# Patient Record
Sex: Male | Born: 1960
Health system: Southern US, Community
[De-identification: ages and names within clinical notes are randomized; demographics above are authoritative.]

## PROBLEM LIST (undated history)

## (undated) ENCOUNTER — Emergency Department (HOSPITAL_BASED_OUTPATIENT_CLINIC_OR_DEPARTMENT_OTHER): Admission: EM | Payer: BC Managed Care – PPO | Source: Home / Self Care

## (undated) DIAGNOSIS — E079 Disorder of thyroid, unspecified: Secondary | ICD-10-CM

## (undated) DIAGNOSIS — R0602 Shortness of breath: Secondary | ICD-10-CM

## (undated) DIAGNOSIS — I471 Supraventricular tachycardia: Secondary | ICD-10-CM

## (undated) DIAGNOSIS — D123 Benign neoplasm of transverse colon: Secondary | ICD-10-CM

## (undated) DIAGNOSIS — K635 Polyp of colon: Secondary | ICD-10-CM

## (undated) DIAGNOSIS — L309 Dermatitis, unspecified: Secondary | ICD-10-CM

## (undated) DIAGNOSIS — E119 Type 2 diabetes mellitus without complications: Secondary | ICD-10-CM

## (undated) DIAGNOSIS — R0609 Other forms of dyspnea: Secondary | ICD-10-CM

## (undated) DIAGNOSIS — E785 Hyperlipidemia, unspecified: Secondary | ICD-10-CM

## (undated) DIAGNOSIS — N186 End stage renal disease: Secondary | ICD-10-CM

## (undated) DIAGNOSIS — N189 Chronic kidney disease, unspecified: Secondary | ICD-10-CM

## (undated) DIAGNOSIS — I350 Nonrheumatic aortic (valve) stenosis: Secondary | ICD-10-CM

## (undated) DIAGNOSIS — K5730 Diverticulosis of large intestine without perforation or abscess without bleeding: Secondary | ICD-10-CM

## (undated) DIAGNOSIS — G629 Polyneuropathy, unspecified: Secondary | ICD-10-CM

## (undated) DIAGNOSIS — M1990 Unspecified osteoarthritis, unspecified site: Secondary | ICD-10-CM

## (undated) DIAGNOSIS — K5900 Constipation, unspecified: Secondary | ICD-10-CM

## (undated) DIAGNOSIS — I6310 Cerebral infarction due to embolism of unspecified precerebral artery: Secondary | ICD-10-CM

## (undated) DIAGNOSIS — R51 Headache: Secondary | ICD-10-CM

## (undated) DIAGNOSIS — H3530 Unspecified macular degeneration: Secondary | ICD-10-CM

## (undated) DIAGNOSIS — G4730 Sleep apnea, unspecified: Secondary | ICD-10-CM

## (undated) DIAGNOSIS — Z5189 Encounter for other specified aftercare: Secondary | ICD-10-CM

## (undated) DIAGNOSIS — I499 Cardiac arrhythmia, unspecified: Secondary | ICD-10-CM

## (undated) DIAGNOSIS — D649 Anemia, unspecified: Secondary | ICD-10-CM

## (undated) DIAGNOSIS — E039 Hypothyroidism, unspecified: Secondary | ICD-10-CM

## (undated) DIAGNOSIS — C801 Malignant (primary) neoplasm, unspecified: Secondary | ICD-10-CM

## (undated) DIAGNOSIS — K56609 Unspecified intestinal obstruction, unspecified as to partial versus complete obstruction: Secondary | ICD-10-CM

## (undated) DIAGNOSIS — T884XXA Failed or difficult intubation, initial encounter: Secondary | ICD-10-CM

## (undated) DIAGNOSIS — K429 Umbilical hernia without obstruction or gangrene: Secondary | ICD-10-CM

## (undated) DIAGNOSIS — I2510 Atherosclerotic heart disease of native coronary artery without angina pectoris: Secondary | ICD-10-CM

## (undated) DIAGNOSIS — R011 Cardiac murmur, unspecified: Secondary | ICD-10-CM

## (undated) DIAGNOSIS — I10 Essential (primary) hypertension: Secondary | ICD-10-CM

## (undated) HISTORY — DX: Cerebral infarction due to embolism of unspecified precerebral artery: I63.10

## (undated) HISTORY — DX: Chronic kidney disease, unspecified: N18.9

## (undated) HISTORY — DX: Diverticulosis of large intestine without perforation or abscess without bleeding: K57.30

## (undated) HISTORY — PX: SKIN CANCER EXCISION: SHX779

## (undated) HISTORY — DX: Supraventricular tachycardia: I47.1

## (undated) HISTORY — DX: Benign neoplasm of transverse colon: D12.3

## (undated) HISTORY — DX: Type 2 diabetes mellitus without complications: E11.9

## (undated) HISTORY — PX: COLONOSCOPY: SHX174

## (undated) HISTORY — DX: Disorder of thyroid, unspecified: E07.9

## (undated) HISTORY — DX: Essential (primary) hypertension: I10

## (undated) HISTORY — DX: Encounter for other specified aftercare: Z51.89

## (undated) HISTORY — DX: Polyp of colon: K63.5

## (undated) HISTORY — PX: KIDNEY TRANSPLANT: SHX239

## (undated) HISTORY — DX: Other forms of dyspnea: R06.09

## (undated) HISTORY — DX: Sleep apnea, unspecified: G47.30

## (undated) HISTORY — DX: Hyperlipidemia, unspecified: E78.5

## (undated) HISTORY — PX: OTHER SURGICAL HISTORY: SHX169

---
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
## 2006-06-13 ENCOUNTER — Inpatient Hospital Stay (HOSPITAL_COMMUNITY): Admission: EM | Admit: 2006-06-13 | Discharge: 2006-06-14 | Payer: Self-pay | Admitting: Emergency Medicine

## 2009-01-21 ENCOUNTER — Emergency Department (HOSPITAL_BASED_OUTPATIENT_CLINIC_OR_DEPARTMENT_OTHER): Admission: EM | Admit: 2009-01-21 | Discharge: 2009-01-21 | Payer: Self-pay | Admitting: Emergency Medicine

## 2009-01-21 ENCOUNTER — Ambulatory Visit: Payer: Self-pay | Admitting: Diagnostic Radiology

## 2009-05-11 ENCOUNTER — Ambulatory Visit (HOSPITAL_COMMUNITY): Admission: RE | Admit: 2009-05-11 | Discharge: 2009-05-11 | Payer: Self-pay | Admitting: Orthopedic Surgery

## 2010-10-08 HISTORY — PX: RENAL BIOPSY: SHX156

## 2011-01-13 LAB — CREATININE, SERUM
Creatinine, Ser: 1.5 mg/dL (ref 0.4–1.5)
GFR calc Af Amer: >60 mL/min (ref >60)
GFR calc non Af Amer: 50 mL/min — ABNORMAL LOW (ref >60)

## 2011-02-23 NOTE — Discharge Summary (Signed)
NAME:  Robert [name], Robert [name]              ACCOUNT NO.:  [id number]   MEDICAL RECORD NO.:  [id number]          PATIENT TYPE:  INP   LOCATION:  97                         FACILITY:  [facility]   PHYSICIAN:  Richard A. Rollene Fare, M.D.DATE OF BIRTH:  11/20/1960   DATE OF ADMISSION:  06/13/2006  DATE OF DISCHARGE:  06/14/2006                                 DISCHARGE SUMMARY   DISCHARGE DIAGNOSES:  1. Paroxysmal supraventricular tachycardia.  2. Hypertension.  3. Chronic renal insufficiency with a history of remote nephrectomy.  4. History of dyslipidemia.  5. History of gout.  6. Treated hypothyroidism.   HISTORY OF PRESENT ILLNESS:  Mr. [name] is a 50 year old male with no prior  history of coronary disease who has been followed by Dr. Juventino Slovak.  He was at a  meeting at work when he developed tachycardia which was associated with  diaphoresis and blurred vision.  When he went to the nurse at work, he was  put on a monitor and this apparently showed SVT, but soon after he converted  spontaneously to sinus rhythm in the 90s.  He was transferred by EMS and  admitted.   PAST MEDICAL HISTORY:  1. Hypertension.  2. Dyslipidemia.  3. Gout.  4. Treated hypothyroidism.  5. Trauma as a child resulting in a left nephrectomy, splenectomy,      appendectomy and partial bowel resection.   LABORATORY DATA AND X-RAY FINDINGS:  His potassium was 3.6.   HOSPITAL COURSE:  The patient was admitted by Dr. Alphonsa Overall.  He discussed  with the family diagnosis of AVNRT and treatment options including RF  ablation versus medical therapy.  We ended up discharging him on June 14, 2006.   DISCHARGE MEDICATIONS:  1. Metoprolol 50 mg b.i.d.  2. Verapamil 180 mg nightly.  3. Lipitor 40 mg a day.  4. Synthroid 0.1 mcg a day.  5. Aspirin daily.  6. He had previously been on Benicar/hydrochlorothiazide and we asked him      to stop this.   DISCHARGE LABORATORY DATA AND X-RAY FINDINGS:  EKG shows sinus rhythm  without acute changes.  Chest x-ray shows no acute disease, borderline heart  size.   White count 8.3, hemoglobin 18.1, hematocrit 52.2, platelets 294.  Sodium  138, potassium 3.6, BUN 14, creatinine 1.7.  LFTs were elevated slightly at  AST of 39, ALT 52.  CK was 296 with 6 MBs and relative index of 2 and a  negative troponin.  TSH was 2.08.   DISPOSITION:  The patient is discharged in stable condition.  He will follow  up with Dr. Rollene Fare as an outpatient.      Erlene Quan, P.A.      Richard A. Rollene Fare, M.D.  Electronically Signed    LKK/MEDQ  D:  08/09/2006  T:  08/10/2006  Job:  [id number]   cc:   Nelda Severe. Juventino Slovak, M.D.

## 2011-06-18 DIAGNOSIS — G4730 Sleep apnea, unspecified: Secondary | ICD-10-CM

## 2011-06-18 HISTORY — DX: Sleep apnea, unspecified: G47.30

## 2013-04-29 ENCOUNTER — Other Ambulatory Visit: Payer: Self-pay | Admitting: *Deleted

## 2013-04-29 MED ORDER — ROSUVASTATIN CALCIUM 20 MG PO TABS: 20.0000 mg | ORAL_TABLET | Freq: Every day | ORAL | Status: DC

## 2013-05-04 ENCOUNTER — Other Ambulatory Visit: Payer: Self-pay | Admitting: *Deleted

## 2013-05-04 MED ORDER — LEVOTHYROXINE SODIUM 100 MCG PO TABS: 100.0000 ug | ORAL_TABLET | Freq: Every day | ORAL | Status: DC

## 2013-07-01 ENCOUNTER — Other Ambulatory Visit: Payer: Self-pay | Admitting: Endocrinology

## 2013-08-10 ENCOUNTER — Telehealth: Payer: Self-pay | Admitting: *Deleted

## 2013-08-10 NOTE — Telephone Encounter (Signed)
Returned call and pt verified x 2.  Pt informed message received and refill cannot be given for 90-day supply as it has been > 1 year since last OV which was Jan. 2013.  Pt informed 30-day supply can be given and he will need appt w/ new cardiologist as Dr. Rollene Fare has retired.  Pt verbalized understanding and stated he will wait for appt w/ MD b/c "it will count against me" to get 30-day supply.  RN offered appt w/ [facility] for refills and then he can come back to see cardiologist.  Pt declined.  Stated he will wait for next appt w/ Dr. Claiborne Billings.  Appt scheduled for 11.11.14 at 11:30am w/ Dr. Claiborne Billings.

## 2013-08-10 NOTE — Telephone Encounter (Signed)
Waiting on paper chart.  Appears last OV was in Jan. 2013.  Pt will need to be seen for refills if this is correct.

## 2013-08-10 NOTE — Telephone Encounter (Signed)
Pt needs his Verapamil 240 mg 90 day supply called in to [facility] on [address]. Pt stated that [facility] did call [facility] and they stated that they did not have this one in their system to call us.

## 2013-08-18 ENCOUNTER — Encounter: Payer: Self-pay | Admitting: Cardiovascular Disease

## 2013-08-18 ENCOUNTER — Ambulatory Visit (INDEPENDENT_AMBULATORY_CARE_PROVIDER_SITE_OTHER): Payer: BC Managed Care – PPO | Admitting: Cardiovascular Disease

## 2013-08-18 VITALS — BP 142/90 | Ht 71.5 in | Wt 332.4 lb

## 2013-08-18 DIAGNOSIS — I471 Supraventricular tachycardia, unspecified: Secondary | ICD-10-CM

## 2013-08-18 DIAGNOSIS — Z905 Acquired absence of kidney: Secondary | ICD-10-CM

## 2013-08-18 DIAGNOSIS — I498 Other specified cardiac arrhythmias: Secondary | ICD-10-CM

## 2013-08-18 DIAGNOSIS — E039 Hypothyroidism, unspecified: Secondary | ICD-10-CM

## 2013-08-18 DIAGNOSIS — R609 Edema, unspecified: Secondary | ICD-10-CM

## 2013-08-18 DIAGNOSIS — R600 Localized edema: Secondary | ICD-10-CM

## 2013-08-18 DIAGNOSIS — I10 Essential (primary) hypertension: Secondary | ICD-10-CM

## 2013-08-18 DIAGNOSIS — G4733 Obstructive sleep apnea (adult) (pediatric): Secondary | ICD-10-CM

## 2013-08-18 DIAGNOSIS — N289 Disorder of kidney and ureter, unspecified: Secondary | ICD-10-CM

## 2013-08-18 DIAGNOSIS — E119 Type 2 diabetes mellitus without complications: Secondary | ICD-10-CM

## 2013-08-18 DIAGNOSIS — E785 Hyperlipidemia, unspecified: Secondary | ICD-10-CM

## 2013-08-18 MED ORDER — VERAPAMIL HCL ER 240 MG PO TBCR: 240.0000 mg | EXTENDED_RELEASE_TABLET | Freq: Every day | ORAL | Status: DC

## 2013-08-18 NOTE — Patient Instructions (Addendum)
Your physician recommends that you schedule a follow-up appointment in: 2 months  Your physician has recommended you make the following change in your medication: Increase hydralazine from 1 tablet to 1 1/2 tablet twice daily And increase Bystolic from 1 tablet to 1 1/2 tablets daily.

## 2013-09-01 ENCOUNTER — Encounter: Payer: Self-pay | Admitting: Cardiovascular Disease

## 2013-09-06 ENCOUNTER — Encounter: Payer: Self-pay | Admitting: Cardiovascular Disease

## 2013-09-06 DIAGNOSIS — E785 Hyperlipidemia, unspecified: Secondary | ICD-10-CM

## 2013-09-06 DIAGNOSIS — G4733 Obstructive sleep apnea (adult) (pediatric): Secondary | ICD-10-CM | POA: Insufficient documentation

## 2013-09-06 DIAGNOSIS — I471 Supraventricular tachycardia, unspecified: Secondary | ICD-10-CM

## 2013-09-06 DIAGNOSIS — R600 Localized edema: Secondary | ICD-10-CM

## 2013-09-06 DIAGNOSIS — Z905 Acquired absence of kidney: Secondary | ICD-10-CM | POA: Insufficient documentation

## 2013-09-06 DIAGNOSIS — E1169 Type 2 diabetes mellitus with other specified complication: Secondary | ICD-10-CM | POA: Insufficient documentation

## 2013-09-06 DIAGNOSIS — E039 Hypothyroidism, unspecified: Secondary | ICD-10-CM | POA: Insufficient documentation

## 2013-09-06 DIAGNOSIS — N289 Disorder of kidney and ureter, unspecified: Secondary | ICD-10-CM | POA: Insufficient documentation

## 2013-09-06 HISTORY — DX: Localized edema: R60.0

## 2013-09-06 HISTORY — DX: Supraventricular tachycardia: I47.1

## 2013-09-06 HISTORY — DX: Acquired absence of kidney: Z90.5

## 2013-09-06 HISTORY — DX: Hyperlipidemia, unspecified: E78.5

## 2013-09-06 HISTORY — DX: Obstructive sleep apnea (adult) (pediatric): G47.33

## 2013-09-06 HISTORY — DX: Supraventricular tachycardia, unspecified: I47.10

## 2013-09-06 NOTE — Progress Notes (Signed)
Patient ID: Robert [name], male   DOB: Mar 02, 1961, 52 y.o.   MRN: [id number]     HPI: Robert [name], is a 52 y.o. male who is a former patient of Dr. Terance Ice who is retired. The patient presents to the office today to establish care with me.  [name]  has a complex medical history that includes a congenital nonfunctioning left kidney for which he underwent left nephrectomy at age 39. He has a history of obesity, diabetes mellitus, and has been demonstrated to have proteinuria of his remaining single kidney, being status post open renal biopsy in [name] in November 2010. This revealed focal segmental glomerular sclerosis. At that time he was seen by Dr. Venita Sheffield in the nephrology department at [facility]. He sees Dr. Moshe Cipro now in [place] for his kidney insufficiency and has been on ARB therapy in the past in an attempt to reduce proteinuria. He sees Dr. Dwyane Dee for his diabetes mellitus. He also has a history of hypothyroidism, hyperlipidemia, anxiety and depression. He has a history of prior supraventricular tachycardia requiring hospitalization in 2007 as was felt to be possibly AV nodal reentrant rhythm. In addition, the patient has a history of complex sleep apnea initially diagnosed in 2007 with an AHI at that time of 56.7 per hour and during REM sleep this increased to 65.7 per hour. At that time he dropped his oxygen 84% and had loud snoring. He had been on BiPAP therapy. He underwent a 5 year followup split-night study in 2012 which again confirmed severe sleep apnea with an AHI of 60.7 on the baseline portion of the study. He was unable to achieve REM sleep at that time. Oxygen saturation during non-REM sleep at 83%. He was titrated utilizing BiPAP up to a setting of 16/12 cm water pressure. He does admit to continued residual daytime sleepiness despite using his BiPAP therapy. Patient also notes leg swelling. He also has had  significant gain in weight and admits to a 50 pound weight gain since he last saw Dr. Rollene Fare almost 2 years ago. He has noticed significant blood pressure lability with blood pressures as high as 180/120. He had been recently taken off losartan as well as atenolol and has been treated with a systolic 10 mg as well as hydralazine 50 mg 2 times daily. His hyperlipidemia has been treated with Crestor 20 mg as well as setting up. He also has hypothyroidism on Synthroid replacement. He presents now to establish care with me. A review of his chart indicates that he did have an echo Doppler study in August 2012 which showed mild concentric LVH with normal systolic function and grade 1 diastolic dysfunction. He had trace mitral and tricuspid insufficiency as well as pulmonic insufficiency. A nuclear perfusion study in August 2012 showed normal perfusion.   Epworth Sleepiness Scale: Situation   Chance of Dozing/Sleeping (0 = never , 1 = slight chance , 2 = moderate chance , 3 = high chance )   sitting and reading 1   watching TV 3   sitting inactive in a public place 1   being a passenger in a motor vehicle for an hour or more 2   lying down in the afternoon 3   sitting and talking to someone 0   sitting quietly after lunch (no alcohol) 3   while stopped for a few minutes in traffic as the driver 0   Total Score  13    Past Medical  History  Diagnosis Date  . Diabetes mellitus without complication   . Chronic kidney disease   . Hypertension   . Thyroid disease   . Sleep apnea 06/18/11    split-night sleep study- [facility].  . Hyperlipidemia   . PAT (paroxysmal atrial tachycardia)   . PSVT (paroxysmal supraventricular tachycardia)     History reviewed. No pertinent past surgical history. except left nephrectomy in 1974.  No Known Allergies  Current Outpatient Prescriptions  Medication Sig Dispense Refill  . BYSTOLIC 10 MG tablet Take 15 mg by mouth daily.       .  citalopram (CELEXA) 20 MG tablet Take 1 tablet by mouth daily.      Marland Kitchen desoximetasone (TOPICORT) 0.25 % cream Apply 1 application topically daily.      . furosemide (LASIX) 80 MG tablet Take 80 mg by mouth 2 (two) times daily. Takes 120 mg      . hydrALAZINE (APRESOLINE) 50 MG tablet Take 75 mg by mouth 2 (two) times daily.       . insulin glargine (LANTUS) 100 units/mL SOLN Inject 26 Units into the skin at bedtime.      . insulin regular human CONCENTRATED (HUMULIN R) 500 UNIT/ML SOLN injection 8/5/8 units daily.      Marland Kitchen levothyroxine (SYNTHROID, LEVOTHROID) 100 MCG tablet Take 1 tablet (100 mcg total) by mouth daily.  90 tablet  1  . Liraglutide (VICTOZA) 18 MG/3ML SOPN Inject 1.2 mg into the skin daily.      . NON FORMULARY CPAP therapy.      . rosuvastatin (CRESTOR) 20 MG tablet Take 1 tablet (20 mg total) by mouth daily.  90 tablet  1  . sildenafil (VIAGRA) 100 MG tablet Take 100 mg by mouth as needed for erectile dysfunction.      . Testosterone (FORTESTA) 10 MG/ACT (2%) GEL Place onto the skin daily. 6 pumps daily      . verapamil (CALAN-SR) 240 MG CR tablet Take 1 tablet (240 mg total) by mouth daily.  90 tablet  3  . ZETIA 10 MG tablet Take 1 tablet by mouth daily.       No current facility-administered medications for this visit.   Social history is notable in that he is married for 27 years. His 3 children. 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 He does drink occasional alcohol. He has smoked cigars.  ROS negative for fever, chills or night sweats. He admits to a 50 pound weight gain over the past 2 years. He denies skin rash he denies vision changes. He denies changes in hearing. He denies cough increased sputum production or wheezing. There is a remote history of PSVT. He denies recent palpitations. He denies PND or orthopnea. He denies chest pain or anginal symptoms. He denies nausea vomiting or diarrhea. He is unaware of any blood in stool or urine.  He does have history of proteinuria. He admits to tense leg swelling he denies claudication. He does have diabetes. He does have hypothyroidism. He does have hyperlipidemia. He does have complex sleep apnea. He admits to residual daytime sleepiness. He is unaware of restless leg. He denies bruxism. He denies seizures. He denies tremors. He denies psychiatric issues but at times has had some anxiety. Other comprehensive 14 point system review is negative.  PE BP 142/90  Ht 5' 11.5" (1.816 m)  Wt 332 lb 6.4 oz (150.776 kg)  BMI 45.72 kg/m2  General: Alert, oriented, no distress.  Skin: normal turgor, no rashes HEENT: Normocephalic, atraumatic. Pupils round and reactive; sclera anicteric; Fundi mild arteriolar narrowing without hemorrhages or exudates the Nose without nasal septal hypertrophy Mouth/Parynx benign; Mallinpatti scale 3 Neck: No JVD, no carotid briuts Lungs: clear to ausculatation and percussion; no wheezing or rales Heart: RRR, s1 s2 normal soft S4. Faint 1/6 systolic murmur. Abdomen: soft, nontender; no hepatosplenomehaly, BS+; abdominal aorta nontender and not dilated by palpation. Pulses 2+ Extremities: 1-2+ tense lower extremity edema; no clubbinbg cyanosis, Homan's sign negative  Neurologic: grossly nonfocal Psychologic: Normal affect and mood appear  ECG: Normal sinus 34 beats per minute. PR interval 138 ms. QT interval 448 ms.    LABS:  BMET    Component Value Date/Time   CREATININE 1.50 05/11/2009 1800   GFRNONAA 50* 05/11/2009 1800   GFRAA  Value: >60        The eGFR has been calculated using the MDRD equation. This calculation has not been validated in all clinical situations. eGFR's persistently <60 mL/min signify possible Chronic Kidney Disease. 05/11/2009 1800     Hepatic Function Panel  No results found for this basename: prot, albumin, ast, alt, alkphos, bilitot, bilidir, ibili     CBC No results found for this basename: wbc, rbc, hgb, hct, plt, mcv, mch,  mchc, rdw, neutrabs, lymphsabs, monoabs, eosabs, basosabs     BNP No results found for this basename: probnp    Lipid Panel  No results found for this basename: chol, trig, hdl, cholhdl, vldl, ldlcalc     RADIOLOGY: No results found.    ASSESSMENT AND PLAN: Mr. Louks has a complex medical history as outlined above. He has a single  kidney with reduced or 2 focal segmental glomerulosclerosis. He does have long-standing history of diabetes mellitus, hypertension, obesity, hypothyroidism, hyperlipidemia, and complex sleep apnea. Presently, I am recommending further titration of his medications from tube for improved blood pressure control. I am increasing his Bystolic from 10 mg to 15 mg daily. I am also increasing his hydralazine from 50 mg twice a day to 75 mg twice a day. We discussed his weight gain and importance of weight loss and activity. He has been documented to have normal systolic function with diastolic relaxation abnormality. Microinfusion study has shown normal perfusion. He does have complex sleep apnea. He cannot recall his last download. Review of his record indicates he was titrated on BiPAP to 16/12. We will contact his M.D[employer] company to arrange for a download of his current unit to make certain he is on adequate therapy. He still has significant residual daytime sleepiness is endorsed by his Epworth Sleepiness Scale calculated above. He tells me he will have an laboratory be done at [facility] and I will obtain these results when available. Cardiac rhythm is stable but he should do well with the increased beta blocker regimen. His diabetes is being followed by Dr. Dwyane Dee. I will see him in 2 months for cardiology reevaluation or sooner if problems arise.     Troy Sine, MD, [facility]  09/06/2013 8:02 PM

## 2013-09-07 ENCOUNTER — Other Ambulatory Visit: Payer: Self-pay | Admitting: Endocrinology

## 2013-10-06 ENCOUNTER — Other Ambulatory Visit: Payer: Self-pay | Admitting: Endocrinology

## 2013-12-28 ENCOUNTER — Other Ambulatory Visit: Payer: Self-pay | Admitting: Endocrinology

## 2014-01-04 ENCOUNTER — Ambulatory Visit (INDEPENDENT_AMBULATORY_CARE_PROVIDER_SITE_OTHER): Payer: BC Managed Care – PPO | Admitting: Endocrinology

## 2014-01-04 ENCOUNTER — Telehealth: Payer: Self-pay | Admitting: *Deleted

## 2014-01-04 ENCOUNTER — Other Ambulatory Visit: Payer: Self-pay | Admitting: *Deleted

## 2014-01-04 ENCOUNTER — Ambulatory Visit (INDEPENDENT_AMBULATORY_CARE_PROVIDER_SITE_OTHER): Payer: BC Managed Care – PPO | Admitting: Cardiology

## 2014-01-04 ENCOUNTER — Encounter: Payer: Self-pay | Admitting: Endocrinology

## 2014-01-04 VITALS — BP 144/92 | HR 72 | Ht 72.0 in | Wt 328.0 lb

## 2014-01-04 VITALS — BP 132/90 | HR 81 | Temp 98.1°F | Resp 16 | Ht 72.0 in | Wt 327.0 lb

## 2014-01-04 DIAGNOSIS — E1129 Type 2 diabetes mellitus with other diabetic kidney complication: Secondary | ICD-10-CM

## 2014-01-04 DIAGNOSIS — E785 Hyperlipidemia, unspecified: Secondary | ICD-10-CM

## 2014-01-04 DIAGNOSIS — R0609 Other forms of dyspnea: Secondary | ICD-10-CM

## 2014-01-04 DIAGNOSIS — R5383 Other fatigue: Secondary | ICD-10-CM

## 2014-01-04 DIAGNOSIS — R0989 Other specified symptoms and signs involving the circulatory and respiratory systems: Secondary | ICD-10-CM

## 2014-01-04 DIAGNOSIS — R5381 Other malaise: Secondary | ICD-10-CM

## 2014-01-04 DIAGNOSIS — E119 Type 2 diabetes mellitus without complications: Secondary | ICD-10-CM

## 2014-01-04 DIAGNOSIS — E039 Hypothyroidism, unspecified: Secondary | ICD-10-CM

## 2014-01-04 DIAGNOSIS — E1165 Type 2 diabetes mellitus with hyperglycemia: Principal | ICD-10-CM

## 2014-01-04 DIAGNOSIS — G4733 Obstructive sleep apnea (adult) (pediatric): Secondary | ICD-10-CM

## 2014-01-04 DIAGNOSIS — E291 Testicular hypofunction: Secondary | ICD-10-CM

## 2014-01-04 DIAGNOSIS — R0600 Dyspnea, unspecified: Secondary | ICD-10-CM

## 2014-01-04 LAB — COMPREHENSIVE METABOLIC PANEL
ALBUMIN: 3.4 g/dL — AB (ref 3.5–5.2)
ALT: 15 U/L (ref 0–53)
AST: 14 U/L (ref 0–37)
Alkaline Phosphatase: 49 U/L (ref 39–117)
BUN: 68 mg/dL — AB (ref 6–23)
CALCIUM: 8.3 mg/dL — AB (ref 8.4–10.5)
CHLORIDE: 106 meq/L (ref 96–112)
CO2: 19 mEq/L (ref 19–32)
Creatinine, Ser: 7.3 mg/dL (ref 0.4–1.5)
GFR: 8.29 mL/min — CL (ref >60.00)
GLUCOSE: 132 mg/dL — AB (ref 70–99)
POTASSIUM: 4.2 meq/L (ref 3.5–5.1)
Sodium: 137 mEq/L (ref 135–145)
TOTAL PROTEIN: 6.6 g/dL (ref 6.0–8.3)
Total Bilirubin: 0.5 mg/dL (ref 0.3–1.2)

## 2014-01-04 LAB — URINALYSIS, ROUTINE W REFLEX MICROSCOPIC
Bilirubin Urine: NEGATIVE
Ketones, ur: NEGATIVE
LEUKOCYTES UA: NEGATIVE
Nitrite: NEGATIVE
RBC / HPF: NONE SEEN (ref 0–?)
SPECIFIC GRAVITY, URINE: 1.02 (ref 1.000–1.030)
Total Protein, Urine: 100 — AB
URINE GLUCOSE: 100 — AB
Urobilinogen, UA: 0.2 (ref 0.0–1.0)
pH: 6 (ref 5.0–8.0)

## 2014-01-04 LAB — LIPID PANEL
CHOL/HDL RATIO: 4
Cholesterol: 174 mg/dL (ref 0–200)
HDL: 38.8 mg/dL — AB (ref >39.00)
LDL CALC: 119 mg/dL — AB (ref 0–99)
Triglycerides: 83 mg/dL (ref 0.0–149.0)
VLDL: 16.6 mg/dL (ref 0.0–40.0)

## 2014-01-04 LAB — TSH: TSH: 2.75 u[IU]/mL (ref 0.35–5.50)

## 2014-01-04 LAB — MICROALBUMIN / CREATININE URINE RATIO
CREATININE, U: 44.6 mg/dL
Microalb Creat Ratio: 503.9 mg/g — ABNORMAL HIGH (ref 0.0–30.0)

## 2014-01-04 LAB — T4, FREE: Free T4: 0.67 ng/dL (ref 0.60–1.60)

## 2014-01-04 LAB — HEMOGLOBIN A1C: Hgb A1c MFr Bld: 9.4 % — ABNORMAL HIGH (ref 4.6–6.5)

## 2014-01-04 MED ORDER — LEVOTHYROXINE SODIUM 100 MCG PO TABS: 100.0000 ug | ORAL_TABLET | Freq: Every day | ORAL | Status: DC

## 2014-01-04 MED ORDER — LIRAGLUTIDE 18 MG/3ML ~~LOC~~ SOPN: 1.2000 mg | PEN_INJECTOR | Freq: Every day | SUBCUTANEOUS | Status: DC

## 2014-01-04 NOTE — Telephone Encounter (Signed)
Tonya from the lab called, patient has critical labs:  Creatinine is 7.3 and GFR is 8.3

## 2014-01-04 NOTE — Progress Notes (Signed)
Patient ID: Robert [name], male   DOB: 1961-10-07, 53 y.o.   MRN: [id number]   Reason for Appointment: Type II Diabetes follow-up   History of Present Illness   Diagnosis date:  2012  Previous history: His blood sugar has been difficult to control since onset. Initially was taking oral hypoglycemic drugs like glyburide but could not take metformin because of renal dysfunction. Since Victoza did not help his control he was started on Lantus and then U 500 insulin also His A1c has previously ranged from 7.8-9.8, the lowest level in 03/2013  Recent history: He has continued his regimen of basal insulin and U-500 insulin as before has not been seen in followup since 6/14 Has changed his Lantus to the bedtime insulin morning for convenience but apparently he is feeling symptoms of hyperglycemia on waking up occasionally He continues to take [place] which although he had received it was causing constipation he is doing well with 1.2 mg now compared to the lower dose previously Current problems:  Not checking blood sugar consistently. Checking blood sugars mostly in the morning and sometimes midday tomorrow afternoon  Appears to have consistently high readings after dinner at night  Has difficulty watching his diet especially in the evenings or when eating out. He thinks most of his high readings at night not related to inconsistent diet. However he thinks that he has done somewhat better portion control  Not able to exercise much because of tendency to dyspnea on exertion  He is concerned about cost of various medications including Victoza     Oral hypoglycemic drugs:        Side effects from medications: None Insulin regimen: LANTUS 26 units hs now. U-500 insulin: 8-5-8. 30 min ac;        Proper timing of medications in relation to meals: Yes.          Monitors blood glucose: Once a day.    Glucometer: One Touch.          Blood Glucose readings from meter download:   PREMEAL Breakfast  Lunch Dinner Bedtime Overall  Glucose range:  75-145   148, 179   133-451   188-206    Mean/median:  104    257   192   147    POST-MEAL PC Breakfast PC Lunch PC Dinner  Glucose range:  101, 194    Mean/median:       Hypoglycemia:  none but feels a little weak and shaky if blood sugars are low  normal         Meals: 3 meals per day. Dinner 7 p,m          Physical activity: exercise: walking 30 min           Dietician visit: Most recent:   Weight control:  Wt Readings from Last 3 Encounters:  01/04/14 327 lb (148.326 kg)  08/18/13 332 lb 6.4 oz (150.776 kg)          Complications:   nephropathy, neuropathy   Diabetes labs:  No results found for this basename: HGBA1C   Lab Results  Component Value Date   CREATININE 1.50 05/11/2009       Medication List       This list is accurate as of: 01/04/14  9:29 AM.  Always use your most recent med list.               BYSTOLIC 10 MG tablet  Generic drug:  nebivolol  Take 15  mg by mouth daily.     citalopram 20 MG tablet  Commonly known as:  CELEXA  Take 1 tablet by mouth daily.     desoximetasone 0.25 % cream  Commonly known as:  TOPICORT  Apply 1 application topically daily.     FORTESTA 10 MG/ACT (2%) Gel  Generic drug:  Testosterone  Place onto the skin daily. 6 pumps daily     furosemide 80 MG tablet  Commonly known as:  LASIX  Take 80 mg by mouth 2 (two) times daily. Takes 120 mg     HUMULIN R 500 UNIT/ML Soln injection  Generic drug:  insulin regular human CONCENTRATED  8/5/8 units daily.     HUMULIN R 500 UNIT/ML Soln injection  Generic drug:  insulin regular human CONCENTRATED  takes 8-4-7     hydrALAZINE 50 MG tablet  Commonly known as:  APRESOLINE  Take 75 mg by mouth 2 (two) times daily.     insulin glargine 100 units/mL Soln  Commonly known as:  LANTUS  Inject 26 Units into the skin at bedtime.     levothyroxine 100 MCG tablet  Commonly known as:  SYNTHROID, LEVOTHROID  Take 1 tablet (100  mcg total) by mouth daily.     NON FORMULARY  CPAP therapy.     rosuvastatin 20 MG tablet  Commonly known as:  CRESTOR  Take 1 tablet (20 mg total) by mouth daily.     sildenafil 100 MG tablet  Commonly known as:  VIAGRA  Take 100 mg by mouth as needed for erectile dysfunction.     verapamil 240 MG CR tablet  Commonly known as:  CALAN-SR  Take 1 tablet (240 mg total) by mouth daily.     VICTOZA 18 MG/3ML Sopn  Generic drug:  Liraglutide  Inject 1.2 mg into the skin daily.     ZETIA 10 MG tablet  Generic drug:  ezetimibe  Take 1 tablet by mouth daily.        Allergies: No Known Allergies  Past Medical History  Diagnosis Date  . Diabetes mellitus without complication   . Chronic kidney disease   . Hypertension   . Thyroid disease   . Sleep apnea 06/18/11    split-night sleep study- [facility].  . Hyperlipidemia   . PAT (paroxysmal atrial tachycardia)   . PSVT (paroxysmal supraventricular tachycardia)     No past surgical history on file.  No family history on file.  Social History:  reports that he has been smoking Pipe.  He has never used smokeless tobacco. He reports that he drinks alcohol. His drug history is not on file.  Review of Systems:  Hypertension:  BP at home 130/80-90 in the mornings, later in the day 150/100-110. His blood pressure has been managed by his cardiologist mostly  Lipids: He has been unable to get consistent control. Was switched from Lipitor to Crestor because of relatively high LDL levels. Also subsequently Zetia was added to get LDL to target. However he is complaining about the cost of both medications now  No results found for this basename: CHOL, HDL, LDLCALC, LDLDIRECT, TRIG, CHOLHDL   He tends to get short of breath on exertion.  Thyroid: He has had long-standing hypothyroidism but has been out of his medication for couple of days TSH had been previously consistent. Does complain of feeling tired  recently    HYPOGONADISM: 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 metabolic syndrome and had subjectively  improved with supplementation using Fortesta He is off testosterone for the last 2 months as he did not have a refill but also complaining about the cost of medication     Examination:   BP 132/90  Pulse 81  Temp(Src) 98.1 F (36.7 C)  Resp 16  Ht 6' (1.829 m)  Wt 327 lb (148.326 kg)  BMI 44.34 kg/m2  SpO2 97%  Body mass index is 44.34 kg/(m^2).    ASSESSMENT/ PLAN::    Diabetes type 2:  Previously has had difficulty controlling his glucose adequately even with multiple insulin doses adding up to over 100 units a day Most of his difficulty appears to be still related to inconsistent diet Although he has lost a little weight he still has a relatively high postprandial readings Currently his fasting readings are fairly good and occasionally low normal with his switching Lantus to night time on his own  Recommendations made today:  Blood glucose control needs to be assessed with an A1c today  Check readings after meals especially supper more often  Control carbohydrates and fats better in meals  Continue same regimen of U-500, 30 minutes before each meal  Change Lantus to the morning to avoid overnight hypoglycemia and better daytime blood sugar control  Continue regular exercise as tolerated  Given co-pay cards today to help his cost of Victoza and Lantus  More regular followup  HYPERCHOLESTEROLEMIA: He needs high-dose statin drugs and would prefer to continue Crestor for better efficacy. Doses will be limited to 20 mg because of renal dysfunction Given co-pay card for this He ideally should be taking Zetia but he does not want to pay for the high cost of this  HYPOGONADISM: 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 metabolic syndrome and had subjectively improved with supplementation He is off testosterone and  will need to reassess his levels today Discussed trying to use generic formulation of Vogelxo as the usual brand names appear to be not on his formulary  HYPERTENSION: Appears inadequately controlled according to his home readings, he will discuss treatment with cardiologist and nephrologist because of multiple other problems and tendency toward edema and ? Diastolic dysfunction  Diabetic neuropathy: Has mild sensory loss   Maycen Degregory 01/04/2014, 9:29 AM   Labs:  A1c 9.4, LDL 119, testosterone level, thyroid normal  Office Visit on 01/04/2014  Component Date Value Ref Range Status  . Hemoglobin A1C 01/04/2014 9.4* 4.6 - 6.5 % Final   Glycemic Control Guidelines for People with Diabetes:Non Diabetic:  <6%Goal of Therapy: <7%Additional Action Suggested:  >8%   . Sodium 01/04/2014 137  135 - 145 mEq/L Final  . Potassium 01/04/2014 4.2  3.5 - 5.1 mEq/L Final  . Chloride 01/04/2014 106  96 - 112 mEq/L Final  . CO2 01/04/2014 19  19 - 32 mEq/L Final  . Glucose, Bld 01/04/2014 132* 70 - 99 mg/dL Final  . BUN 01/04/2014 68* 6 - 23 mg/dL Final  . Creatinine, Ser 01/04/2014 7.3* 0.4 - 1.5 mg/dL Final  . Total Bilirubin 01/04/2014 0.5  0.3 - 1.2 mg/dL Final  . Alkaline Phosphatase 01/04/2014 49  39 - 117 U/L Final  . AST 01/04/2014 14  0 - 37 U/L Final  . ALT 01/04/2014 15  0 - 53 U/L Final  . Total Protein 01/04/2014 6.6  6.0 - 8.3 g/dL Final  . Albumin 01/04/2014 3.4* 3.5 - 5.2 g/dL Final  . Calcium 01/04/2014 8.3* 8.4 - 10.5 mg/dL Final  . GFR 01/04/2014 8.29* >  60.00 mL/min Final  . Microalb, Ur 01/04/2014 224.5 Repeated and verified X2.* 0.0 - 1.9 mg/dL Final   Verified by manual dilution.  . Creatinine,U 01/04/2014 44.6   Final  . Microalb Creat Ratio 01/04/2014 503.9* 0.0 - 30.0 mg/g Final  . Cholesterol 01/04/2014 174  0 - 200 mg/dL Final   ATP III Classification       Desirable:  < 200 mg/dL               Borderline High:  200 - 239 mg/dL          High:  > = 240 mg/dL  .  Triglycerides 01/04/2014 83.0  0.0 - 149.0 mg/dL Final   Normal:  <150 mg/dLBorderline High:  150 - 199 mg/dL  . HDL 01/04/2014 38.80* >39.00 mg/dL Final  . VLDL 01/04/2014 16.6  0.0 - 40.0 mg/dL Final  . LDL Cholesterol 01/04/2014 119* 0 - 99 mg/dL Final  . Total CHOL/HDL Ratio 01/04/2014 4   Final                  Men          Women1/2 Average Risk     3.4          3.3Average Risk          5.0          4.42X Average Risk          9.6          7.13X Average Risk          15.0          11.0                      . TSH 01/04/2014 2.75  0.35 - 5.50 uIU/mL Final  . Testosterone 01/04/2014 304  300 - 890 ng/dL Final   Comment:           Tanner Stage       Male              Male                                        I              < 30 ng/dL        < 10 ng/dL                                        II             < 150 ng/dL       < 30 ng/dL                                        III            100-320 ng/dL     < 35 ng/dL                                        IV  200-970 ng/dL     15-40 ng/dL                                        V/Adult        300-890 ng/dL     10-70 ng/dL                             . Sex Hormone Binding 01/04/2014 24  13 - 71 nmol/L Final  . Testosterone, Free 01/04/2014 71.2  47.0 - 244.0 pg/mL Final   Comment:                            The concentration of free testosterone is derived from a mathematical                          expression based on constants for the binding of testosterone to sex                          hormone-binding globulin and albumin.  . Testosterone-% Free 01/04/2014 2.3  1.6 - 2.9 % Final  . Free T4 01/04/2014 0.67  0.60 - 1.60 ng/dL Final  . Color, Urine 01/04/2014 YELLOW  Yellow;Lt. Yellow Final  . APPearance 01/04/2014 CLEAR  Clear Final  . Specific Gravity, Urine 01/04/2014 1.020  1.000-1.030 Final  . pH 01/04/2014 6.0  5.0 - 8.0 Final  . Total Protein, Urine 01/04/2014 100* Negative Final  . Urine Glucose 01/04/2014 100*  Negative Final  . Ketones, ur 01/04/2014 NEGATIVE  Negative Final  . Bilirubin Urine 01/04/2014 NEGATIVE  Negative Final  . Hgb urine dipstick 01/04/2014 TRACE-LYSED* Negative Final  . Urobilinogen, UA 01/04/2014 0.2  0.0 - 1.0 Final  . Leukocytes, UA 01/04/2014 NEGATIVE  Negative Final  . Nitrite 01/04/2014 NEGATIVE  Negative Final  . WBC, UA 01/04/2014 0-2/hpf  0-2/hpf Final  . RBC / HPF 01/04/2014 none seen  0-2/hpf Final  . Squamous Epithelial / LPF 01/04/2014 Rare(0-4/hpf)  Rare(0-4/hpf) Final

## 2014-01-04 NOTE — Telephone Encounter (Signed)
Please let him know his kidney function appears much worse and fax the results to Dr. Moshe Cipro

## 2014-01-04 NOTE — Patient Instructions (Addendum)
Lantus 26 units in am also, may reduce to 24 if supper sugars < 90  Please check blood sugars at least half the time about 2 hours after any meal and as directed on waking up.  Please bring blood sugar monitor to each visit  Discuss BP Rx with Dr Claiborne Billings

## 2014-01-04 NOTE — Patient Instructions (Addendum)
Your physician recommends that you schedule a follow-up appointment in:    Also we will schedule A 2D-Echo

## 2014-01-04 NOTE — Telephone Encounter (Signed)
Results given to patient and faxed to Dr. Moshe Cipro.

## 2014-01-05 ENCOUNTER — Other Ambulatory Visit: Payer: Self-pay | Admitting: *Deleted

## 2014-01-05 LAB — TESTOSTERONE, FREE, TOTAL, SHBG
Sex Hormone Binding: 24 nmol/L (ref 13–71)
Testosterone, Free: 71.2 pg/mL (ref 47.0–244.0)
Testosterone-% Free: 2.3 % (ref 1.6–2.9)
Testosterone: 304 ng/dL (ref 300–890)

## 2014-01-05 MED ORDER — INSULIN REGULAR HUMAN (CONC) 500 UNIT/ML ~~LOC~~ SOLN: SUBCUTANEOUS | Status: DC

## 2014-01-05 MED ORDER — LEVOTHYROXINE SODIUM 100 MCG PO TABS: 100.0000 ug | ORAL_TABLET | Freq: Every day | ORAL | Status: DC

## 2014-01-05 MED ORDER — INSULIN PEN NEEDLE 32G X 4 MM MISC: Status: DC

## 2014-01-05 MED ORDER — INSULIN GLARGINE 100 UNIT/ML SOLOSTAR PEN: 26.0000 [IU] | PEN_INJECTOR | Freq: Every day | SUBCUTANEOUS | Status: DC

## 2014-01-05 MED ORDER — ROSUVASTATIN CALCIUM 20 MG PO TABS: 20.0000 mg | ORAL_TABLET | Freq: Every day | ORAL | Status: DC

## 2014-01-08 ENCOUNTER — Encounter: Payer: Self-pay | Admitting: Cardiology

## 2014-01-08 DIAGNOSIS — R5383 Other fatigue: Secondary | ICD-10-CM | POA: Insufficient documentation

## 2014-01-08 HISTORY — DX: Other fatigue: R53.83

## 2014-01-08 NOTE — Assessment & Plan Note (Addendum)
Fatigue may very well be due to the fact that he has been off of his synthroid for several months (recently restarted). However, given his increased fatigue with dyspnea and LEE, I will order a 2D echocardiogram to see if there has been any change in systolic function since prior study in 2012. Pt instructed to continue with is daily Lasix. His fatigue may also be related to noncompliance with CPAP therapy, in the setting of OSA. He is due back to see Dr. Claiborne Billings in sleep clinic for re-evaluation. Pt was encouraged to try to sleep with CPAP.

## 2014-01-08 NOTE — Assessment & Plan Note (Signed)
Recently restarted synthroid. Followed by PCP.

## 2014-01-08 NOTE — Progress Notes (Signed)
Patient ID: Robert [name], male   DOB: [birth year], 53 y.o.   MRN: [id number]    01/08/2014 ENRIQUE RAVELING   05/11/1961  [id number]  Primary Physicia Pauline Good, MD Primary Cardiologist: Dr. Claiborne Billings  HPI:  [name] is a 53 y/o male, previously followed by Dr. Rollene Fare and now followed by Dr. Claiborne Billings. He has a complex medical history that includes a congenital nonfunctioning left kidney for which he underwent left nephrectomy at age 1. He has a history of obesity, diabetes mellitus, and has been demonstrated to have proteinuria of his remaining single kidney, being status post open renal biopsy in [name] in November 2010. This revealed focal segmental glomerular sclerosis. At that time he was seen by Dr. Venita Sheffield in the nephrology department at [facility]. He sees Dr. Moshe Cipro now in [place] for his kidney insufficiency and has been on ARB therapy in the past in an attempt to reduce proteinuria. He sees Dr. Dwyane Dee for his diabetes mellitus. He also has a history of hypothyroidism, hyperlipidemia, anxiety and depression. He has a history of prior supraventricular tachycardia requiring hospitalization in 2007 as was felt to be possibly AV nodal reentrant rhythm.   In addition, the patient has a history of complex sleep apnea initially diagnosed in 2007 with an AHI at that time of 56.7 per hour and during REM sleep this increased to 65.7 per hour. At that time he dropped his oxygen 84% and had loud snoring. He had been on BiPAP therapy. He underwent a 5 year followup split-night study in 2012 which again confirmed severe sleep apnea with an AHI of 60.7 on the baseline portion of the study. He was unable to achieve REM sleep at that time. Oxygen saturation during non-REM sleep at 83%. He was titrated utilizing BiPAP up to a setting of 16/12 cm water pressure. He does admit to continued residual daytime sleepiness despite using his BiPAP  therapy.  Doppler study in August 2012 which showed mild concentric LVH with normal systolic function and grade 1 diastolic dysfunction. He had trace mitral and tricuspid insufficiency as well as pulmonic insufficiency. A nuclear perfusion study in August 2012 showed normal perfusion.   He presents to clinic today with a complaint of increased fatigue over the past several months. He continues to note DOE and mild SOB at rest, but contributes this to his weight. He has some SOB at night but admits to being non compliant with CPAP.  He also feels that some of his fatigue may be due to the fact that he has been off of his synthroid and verapamil since christmas. He recently started back taking both meds this week. He denies chest pain but notes bilateral LEE. Also denies syncope/ near syncope, dizziness and palpitations.    Current Outpatient Prescriptions  Medication Sig Dispense Refill  . BYSTOLIC 10 MG tablet Take 15 mg by mouth daily.       Marland Kitchen desoximetasone (TOPICORT) 0.25 % cream Apply 1 application topically daily.      . furosemide (LASIX) 80 MG tablet Take 80 mg by mouth 2 (two) times daily. Takes 120 mg      . hydrALAZINE (APRESOLINE) 50 MG tablet Take 75 mg by mouth 2 (two) times daily.       . Liraglutide (VICTOZA) 18 MG/3ML SOPN Inject 1.2 mg into the skin daily.  6 pen  1  . NON FORMULARY CPAP therapy.      . sildenafil (VIAGRA) 100 MG tablet  Take 100 mg by mouth as needed for erectile dysfunction.      . Insulin Glargine (LANTUS SOLOSTAR) 100 UNIT/ML Solostar Pen Inject 26 Units into the skin daily at 10 pm.  15 pen  1  . Insulin Pen Needle 32G X 4 MM MISC Use 4 pen needles per day to inject insulin  250 each  1  . insulin regular human CONCENTRATED (HUMULIN R) 500 UNIT/ML SOLN injection 8/5/8 units daily.  60 mL  1  . levothyroxine (SYNTHROID, LEVOTHROID) 100 MCG tablet Take 1 tablet (100 mcg total) by mouth daily.  90 tablet  1  . rosuvastatin (CRESTOR) 20 MG tablet Take 1 tablet  (20 mg total) by mouth daily.  90 tablet  1   No current facility-administered medications for this visit.    No Known Allergies  History   Social History  . Marital Status: Married    Spouse Name: N/A    Number of Children: N/A  . Years of Education: N/A   Occupational History  . Not on file.   Social History Main Topics  . Smoking status: Current Some Day Smoker    Types: Pipe  . Smokeless tobacco: Never Used  . Alcohol Use: Yes     Comment: 1 glass of wine every other day.  . Drug Use: Not on file  . Sexual Activity: Not on file   Other Topics Concern  . Not on file   Social History Narrative  . No narrative on file     Review of Systems: General: negative for chills, fever, night sweats or weight changes.  Cardiovascular: negative for chest pain, dyspnea on exertion, edema, orthopnea, palpitations, paroxysmal nocturnal dyspnea or shortness of breath Dermatological: negative for rash Respiratory: negative for cough or wheezing Urologic: negative for hematuria Abdominal: negative for nausea, vomiting, diarrhea, bright red blood per rectum, melena, or hematemesis Neurologic: negative for visual changes, syncope, or dizziness All other systems reviewed and are otherwise negative except as noted above.    Blood pressure 144/92, pulse 72, height 6' (1.829 m), weight 328 lb (148.78 kg).  General appearance: alert, cooperative, no distress and morbidly obese Neck: no carotid bruit and no JVD Lungs: clear to auscultation bilaterally Heart: regular rate and rhythm, S1, S2 normal, no murmur, click, rub or gallop Abdomen: soft, non-tender; bowel sounds normal; no masses,  no organomegaly Extremities: 1+ bilateral LEE Pulses: 2+ and symmetric Skin: warm and dry Neurologic: Grossly normal   ASSESSMENT AND PLAN:   Fatigue Fatigue may very well be due to the fact that he has been off of his synthroid for several months (recently restarted). However, given his  increased fatigue with dyspnea and LEE, I will order a 2D echocardiogram to see if there has been any change in systolic function since prior study in 2012. Pt instructed to continue with is daily Lasix. His fatigue may also be related to noncompliance with CPAP therapy, in the setting of OSA. He is due back to see Dr. Claiborne Billings in sleep clinic for re-evaluation. Pt was encouraged to try to sleep with CPAP.   Severe obstructive sleep apnea Noncompliant with CPAP. He is followed by Dr. Claiborne Billings in sleep clinic.   Hypothyroidism Recently restarted synthroid. Followed by PCP.     PLAN  Will obtain a 2D echo to evaluate systolic function, look for valvular disorders and WMAs, to see if there are any potential cardiac issues that may explain his fatigue and dyspnea. Pt is to f/u with Dr. Claiborne Billings  in sleep clinic. 2D echo results can be reviewed at that appointment. Continue daily lasix for now.   [place], [place] 01/08/2014 5:23 PM

## 2014-01-08 NOTE — Assessment & Plan Note (Signed)
Noncompliant with CPAP. He is followed by Dr. Claiborne Billings in sleep clinic.

## 2014-01-14 ENCOUNTER — Ambulatory Visit (HOSPITAL_COMMUNITY)
Admission: RE | Admit: 2014-01-14 | Discharge: 2014-01-14 | Disposition: A | Discharge status: Home / Self Care | Payer: BC Managed Care – PPO | Source: Ambulatory Visit | Attending: Cardiovascular Disease | Admitting: Cardiovascular Disease

## 2014-01-14 DIAGNOSIS — R0989 Other specified symptoms and signs involving the circulatory and respiratory systems: Principal | ICD-10-CM | POA: Insufficient documentation

## 2014-01-14 DIAGNOSIS — I517 Cardiomegaly: Secondary | ICD-10-CM

## 2014-01-14 DIAGNOSIS — R0609 Other forms of dyspnea: Secondary | ICD-10-CM | POA: Insufficient documentation

## 2014-01-14 DIAGNOSIS — R0600 Dyspnea, unspecified: Secondary | ICD-10-CM

## 2014-01-14 NOTE — Progress Notes (Signed)
2D Echo Performed 01/14/2014    Dianelly Ferran, RCS  

## 2014-01-28 ENCOUNTER — Encounter (HOSPITAL_BASED_OUTPATIENT_CLINIC_OR_DEPARTMENT_OTHER): Payer: Self-pay | Admitting: Emergency Medicine

## 2014-01-28 ENCOUNTER — Emergency Department (HOSPITAL_BASED_OUTPATIENT_CLINIC_OR_DEPARTMENT_OTHER): Payer: No Typology Code available for payment source

## 2014-01-28 ENCOUNTER — Emergency Department (HOSPITAL_BASED_OUTPATIENT_CLINIC_OR_DEPARTMENT_OTHER)
Admission: EM | Admit: 2014-01-28 | Discharge: 2014-01-28 | Disposition: A | Discharge status: Home / Self Care | Payer: No Typology Code available for payment source | Attending: Emergency Medicine | Admitting: Emergency Medicine

## 2014-01-28 DIAGNOSIS — E785 Hyperlipidemia, unspecified: Secondary | ICD-10-CM | POA: Insufficient documentation

## 2014-01-28 DIAGNOSIS — Y92410 Unspecified street and highway as the place of occurrence of the external cause: Secondary | ICD-10-CM | POA: Insufficient documentation

## 2014-01-28 DIAGNOSIS — S199XXA Unspecified injury of neck, initial encounter: Secondary | ICD-10-CM

## 2014-01-28 DIAGNOSIS — Z794 Long term (current) use of insulin: Secondary | ICD-10-CM | POA: Insufficient documentation

## 2014-01-28 DIAGNOSIS — N189 Chronic kidney disease, unspecified: Secondary | ICD-10-CM | POA: Insufficient documentation

## 2014-01-28 DIAGNOSIS — E079 Disorder of thyroid, unspecified: Secondary | ICD-10-CM | POA: Insufficient documentation

## 2014-01-28 DIAGNOSIS — M25511 Pain in right shoulder: Secondary | ICD-10-CM

## 2014-01-28 DIAGNOSIS — R739 Hyperglycemia, unspecified: Secondary | ICD-10-CM

## 2014-01-28 DIAGNOSIS — IMO0002 Reserved for concepts with insufficient information to code with codable children: Secondary | ICD-10-CM | POA: Insufficient documentation

## 2014-01-28 DIAGNOSIS — I129 Hypertensive chronic kidney disease with stage 1 through stage 4 chronic kidney disease, or unspecified chronic kidney disease: Secondary | ICD-10-CM | POA: Insufficient documentation

## 2014-01-28 DIAGNOSIS — S46909A Unspecified injury of unspecified muscle, fascia and tendon at shoulder and upper arm level, unspecified arm, initial encounter: Secondary | ICD-10-CM | POA: Insufficient documentation

## 2014-01-28 DIAGNOSIS — E119 Type 2 diabetes mellitus without complications: Secondary | ICD-10-CM | POA: Insufficient documentation

## 2014-01-28 DIAGNOSIS — F17200 Nicotine dependence, unspecified, uncomplicated: Secondary | ICD-10-CM | POA: Insufficient documentation

## 2014-01-28 DIAGNOSIS — Y9389 Activity, other specified: Secondary | ICD-10-CM | POA: Insufficient documentation

## 2014-01-28 DIAGNOSIS — S4980XA Other specified injuries of shoulder and upper arm, unspecified arm, initial encounter: Secondary | ICD-10-CM | POA: Insufficient documentation

## 2014-01-28 DIAGNOSIS — S0990XA Unspecified injury of head, initial encounter: Secondary | ICD-10-CM | POA: Insufficient documentation

## 2014-01-28 DIAGNOSIS — S0993XA Unspecified injury of face, initial encounter: Secondary | ICD-10-CM | POA: Insufficient documentation

## 2014-01-28 DIAGNOSIS — G4730 Sleep apnea, unspecified: Secondary | ICD-10-CM | POA: Insufficient documentation

## 2014-01-28 DIAGNOSIS — Z79899 Other long term (current) drug therapy: Secondary | ICD-10-CM | POA: Insufficient documentation

## 2014-01-28 LAB — CBG MONITORING, ED: Glucose-Capillary: 388 mg/dL — ABNORMAL HIGH (ref 70–99)

## 2014-01-28 MED ORDER — INSULIN REGULAR HUMAN 100 UNIT/ML IJ SOLN
10.0000 [IU] | Freq: Once | INTRAMUSCULAR | Status: AC
  Administered 2014-01-28: 10 [IU] via SUBCUTANEOUS
  Filled 2014-01-28: qty 1
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
## 2014-01-28 NOTE — Discharge Instructions (Signed)
Hyperglycemia °Hyperglycemia occurs when the glucose (sugar) in your blood is too high. Hyperglycemia can happen for many reasons, but it most often happens to people who do not know they have diabetes or are not managing their diabetes properly.  °CAUSES  °Whether you have diabetes or not, there are other causes of hyperglycemia. Hyperglycemia can occur when you have diabetes, but it can also occur in other situations that you might not be as aware of, such as: °Diabetes °· If you have diabetes and are having problems controlling your blood glucose, hyperglycemia could occur because of some of the following reasons: °· Not following your meal plan. °· Not taking your diabetes medications or not taking it properly. °· Exercising less or doing less activity than you normally do. °· Being sick. °Pre-diabetes °· This cannot be ignored. Before people develop Type 2 diabetes, they almost always have "pre-diabetes." This is when your blood glucose levels are higher than normal, but not yet high enough to be diagnosed as diabetes. Research has shown that some long-term damage to the body, especially the heart and circulatory system, may already be occurring during pre-diabetes. If you take action to manage your blood glucose when you have pre-diabetes, you may delay or prevent Type 2 diabetes from developing. °Stress °· If you have diabetes, you may be "diet" controlled or on oral medications or insulin to control your diabetes. However, you may find that your blood glucose is higher than usual in the hospital whether you have diabetes or not. This is often referred to as "stress hyperglycemia." Stress can elevate your blood glucose. This happens because of hormones put out by the body during times of stress. If stress has been the cause of your high blood glucose, it can be followed regularly by your caregiver. That way he/she can make sure your hyperglycemia does not continue to get worse or progress to  diabetes. °Steroids °· Steroids are medications that act on the infection fighting system (immune system) to block inflammation or infection. One side effect can be a rise in blood glucose. Most people can produce enough extra insulin to allow for this rise, but for those who cannot, steroids make blood glucose levels go even higher. It is not unusual for steroid treatments to "uncover" diabetes that is developing. It is not always possible to determine if the hyperglycemia will go away after the steroids are stopped. A special blood test called an A1c is sometimes done to determine if your blood glucose was elevated before the steroids were started. °SYMPTOMS °· Thirsty. °· Frequent urination. °· Dry mouth. °· Blurred vision. °· Tired or fatigue. °· Weakness. °· Sleepy. °· Tingling in feet or leg. °DIAGNOSIS  °Diagnosis is made by monitoring blood glucose in one or all of the following ways: °· A1c test. This is a chemical found in your blood. °· Fingerstick blood glucose monitoring. °· Laboratory results. °TREATMENT  °First, knowing the cause of the hyperglycemia is important before the hyperglycemia can be treated. Treatment may include, but is not be limited to: °· Education. °· Change or adjustment in medications. °· Change or adjustment in meal plan. °· Treatment for an illness, infection, etc. °· More frequent blood glucose monitoring. °· Change in exercise plan. °· Decreasing or stopping steroids. °· Lifestyle changes. °HOME CARE INSTRUCTIONS  °· Test your blood glucose as directed. °· Exercise regularly. Your caregiver will give you instructions about exercise. Pre-diabetes or diabetes which comes on with stress is helped by exercising. °· Eat wholesome,   balanced meals. Eat often and at regular, fixed times. Your caregiver or nutritionist will give you a meal plan to guide your sugar intake.  Being at an ideal weight is important. If needed, losing as little as 10 to 15 pounds may help improve blood  glucose levels. SEEK MEDICAL CARE IF:   You have questions about medicine, activity, or diet.  You continue to have symptoms (problems such as increased thirst, urination, or weight gain). SEEK IMMEDIATE MEDICAL CARE IF:   You are vomiting or have diarrhea.  Your breath smells fruity.  You are breathing faster or slower.  You are very sleepy or incoherent.  You have numbness, tingling, or pain in your feet or hands.  You have chest pain.  Your symptoms get worse even though you have been following your caregiver's orders.  If you have any other questions or concerns. Document Released: 03/20/2001 Document Revised: 12/17/2011 Document Reviewed: 01/21/2012 [facility] Patient Information 2014 [place], [place].  Motor Vehicle Collision  It is common to have multiple bruises and sore muscles after a motor vehicle collision (MVC). These tend to feel worse for the first 24 hours. You may have the most stiffness and soreness over the first several hours. You may also feel worse when you wake up the first morning after your collision. After this point, you will usually begin to improve with each day. The speed of improvement often depends on the severity of the collision, the number of injuries, and the location and nature of these injuries. HOME CARE INSTRUCTIONS   Put ice on the injured area.  Put ice in a plastic bag.  Place a towel between your skin and the bag.  Leave the ice on for 15-20 minutes, 03-04 times a day.  Drink enough fluids to keep your urine clear or pale yellow. Do not drink alcohol.  Take a warm shower or bath once or twice a day. This will increase blood flow to sore muscles.  You may return to activities as directed by your caregiver. Be careful when lifting, as this may aggravate neck or back pain.  Only take over-the-counter or prescription medicines for pain, discomfort, or fever as directed by your caregiver. Do not use aspirin. This may increase bruising  and bleeding. SEEK IMMEDIATE MEDICAL CARE IF:  You have numbness, tingling, or weakness in the arms or legs.  You develop severe headaches not relieved with medicine.  You have severe neck pain, especially tenderness in the middle of the back of your neck.  You have changes in bowel or bladder control.  There is increasing pain in any area of the body.  You have shortness of breath, lightheadedness, dizziness, or fainting.  You have chest pain.  You feel sick to your stomach (nauseous), throw up (vomit), or sweat.  You have increasing abdominal discomfort.  There is blood in your urine, stool, or vomit.  You have pain in your shoulder (shoulder strap areas).  You feel your symptoms are getting worse. MAKE SURE YOU:   Understand these instructions.  Will watch your condition.  Will get help right away if you are not doing well or get worse. Document Released: 09/24/2005 Document Revised: 12/17/2011 Document Reviewed: 02/21/2011 [facility] Patient Information 2014 [place], [place].
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
## 2014-02-25 ENCOUNTER — Encounter (HOSPITAL_COMMUNITY)
Admission: RE | Admit: 2014-02-25 | Discharge: 2014-02-25 | Disposition: A | Discharge status: Home / Self Care | Payer: BC Managed Care – PPO | Source: Ambulatory Visit | Attending: Nephrology | Admitting: Nephrology

## 2014-02-25 DIAGNOSIS — N184 Chronic kidney disease, stage 4 (severe): Secondary | ICD-10-CM | POA: Diagnosis not present

## 2014-02-25 DIAGNOSIS — D638 Anemia in other chronic diseases classified elsewhere: Secondary | ICD-10-CM | POA: Diagnosis present

## 2014-02-25 LAB — HEPATITIS B SURFACE ANTIGEN: Hepatitis B Surface Ag: NEGATIVE

## 2014-02-25 LAB — POCT HEMOGLOBIN-HEMACUE: HEMOGLOBIN: 8.2 g/dL — AB (ref 13.0–17.0)

## 2014-02-25 MED ORDER — EPOETIN ALFA 20000 UNIT/ML IJ SOLN
20000.0000 [IU] | INTRAMUSCULAR | Status: DC
  Administered 2014-02-25: 20000 [IU] via SUBCUTANEOUS

## 2014-02-25 MED ORDER — EPOETIN ALFA 20000 UNIT/ML IJ SOLN
INTRAMUSCULAR | Status: AC
  Filled 2014-02-25: qty 1

## 2014-02-25 NOTE — Discharge Instructions (Signed)
Epoetin Alfa injection What is this medicine? EPOETIN ALFA (e POE e tin AL fa) helps your body make more red blood cells. This medicine is used to treat anemia caused by chronic kidney failure, cancer chemotherapy, or HIV-therapy. It may also be used before surgery if you have anemia. This medicine may be used for other purposes; ask your health care provider or pharmacist if you have questions. COMMON BRAND NAME(S): Epogen, Procrit What should I tell my health care provider before I take this medicine? They need to know if you have any of these conditions: -blood clotting disorders -cancer patient not on chemotherapy -cystic fibrosis -heart disease, such as angina or heart failure -hemoglobin level of 12 g/dL or greater -high blood pressure -low levels of folate, iron, or vitamin B12 -seizures -an unusual or allergic reaction to erythropoietin, albumin, benzyl alcohol, hamster proteins, other medicines, foods, dyes, or preservatives -pregnant or trying to get pregnant -breast-feeding How should I use this medicine? This medicine is for injection into a vein or under the skin. It is usually given by a health care professional in a hospital or clinic setting. If you get this medicine at home, you will be taught how to prepare and give this medicine. Use exactly as directed. Take your medicine at regular intervals. Do not take your medicine more often than directed. It is important that you put your used needles and syringes in a special sharps container. Do not put them in a trash can. If you do not have a sharps container, call your pharmacist or healthcare provider to get one. Talk to your pediatrician regarding the use of this medicine in children. While this drug may be prescribed for selected conditions, precautions do apply. Overdosage: If you think you have taken too much of this medicine contact a poison control center or emergency room at once. NOTE: This medicine is only for you. Do  not share this medicine with others. What if I miss a dose? If you miss a dose, take it as soon as you can. If it is almost time for your next dose, take only that dose. Do not take double or extra doses. What may interact with this medicine? Do not take this medicine with any of the following medications: -darbepoetin alfa This list may not describe all possible interactions. Give your health care provider a list of all the medicines, herbs, non-prescription drugs, or dietary supplements you use. Also tell them if you smoke, drink alcohol, or use illegal drugs. Some items may interact with your medicine. What should I watch for while using this medicine? Visit your prescriber or health care professional for regular checks on your progress and for the needed blood tests and blood pressure measurements. It is especially important for the doctor to make sure your hemoglobin level is in the desired range, to limit the risk of potential side effects and to give you the best benefit. Keep all appointments for any recommended tests. Check your blood pressure as directed. Ask your doctor what your blood pressure should be and when you should contact him or her. As your body makes more red blood cells, you may need to take iron, folic acid, or vitamin B supplements. Ask your doctor or health care provider which products are right for you. If you have kidney disease continue dietary restrictions, even though this medication can make you feel better. Talk with your doctor or health care professional about the foods you eat and the vitamins that you take. What   side effects may I notice from receiving this medicine? Side effects that you should report to your doctor or health care professional as soon as possible: -allergic reactions like skin rash, itching or hives, swelling of the face, lips, or tongue -breathing problems -changes in vision -chest pain -confusion, trouble speaking or understanding -feeling  faint or lightheaded, falls -high blood pressure -muscle aches or pains -pain, swelling, warmth in the leg -rapid weight gain -severe headaches -sudden numbness or weakness of the face, arm or leg -trouble walking, dizziness, loss of balance or coordination -seizures (convulsions) -swelling of the ankles, feet, hands -unusually weak or tired Side effects that usually do not require medical attention (report to your doctor or health care professional if they continue or are bothersome): -diarrhea -fever, chills (flu-like symptoms) -headaches -nausea, vomiting -redness, stinging, or swelling at site where injected This list may not describe all possible side effects. Call your doctor for medical advice about side effects. You may report side effects to FDA at 1-800-FDA-1088. Where should I keep my medicine? Keep out of the reach of children. Store in a refrigerator between 2 and 8 degrees C (36 and 46 degrees F). Do not freeze or shake. Throw away any unused portion if using a single-dose vial. Multi-dose vials can be kept in the refrigerator for up to 21 days after the initial dose. Throw away unused medicine. NOTE: This sheet is a summary. It may not cover all possible information. If you have questions about this medicine, talk to your doctor, pharmacist, or health care provider.  2014, [employer]. (2008-09-07 10:25:44)  

## 2014-03-10 ENCOUNTER — Other Ambulatory Visit (HOSPITAL_COMMUNITY): Payer: Self-pay | Admitting: *Deleted

## 2014-03-11 ENCOUNTER — Encounter (HOSPITAL_COMMUNITY)
Admission: RE | Admit: 2014-03-11 | Discharge: 2014-03-11 | Disposition: A | Discharge status: Home / Self Care | Payer: BC Managed Care – PPO | Source: Ambulatory Visit | Attending: Nephrology | Admitting: Nephrology

## 2014-03-11 DIAGNOSIS — Z5181 Encounter for therapeutic drug level monitoring: Secondary | ICD-10-CM | POA: Insufficient documentation

## 2014-03-11 DIAGNOSIS — N184 Chronic kidney disease, stage 4 (severe): Secondary | ICD-10-CM | POA: Insufficient documentation

## 2014-03-11 LAB — RENAL FUNCTION PANEL
Albumin: 3.9 g/dL (ref 3.5–5.2)
BUN: 91 mg/dL — AB (ref 6–23)
CHLORIDE: 92 meq/L — AB (ref 96–112)
CO2: 24 mEq/L (ref 19–32)
CREATININE: 11.4 mg/dL — AB (ref 0.50–1.35)
Calcium: 10.1 mg/dL (ref 8.4–10.5)
GFR calc Af Amer: 5 mL/min — ABNORMAL LOW (ref >90)
GFR, EST NON AFRICAN AMERICAN: 4 mL/min — AB (ref >90)
Glucose, Bld: 226 mg/dL — ABNORMAL HIGH (ref 70–99)
POTASSIUM: 3.9 meq/L (ref 3.7–5.3)
Phosphorus: 6.9 mg/dL — ABNORMAL HIGH (ref 2.3–4.6)
Sodium: 138 mEq/L (ref 137–147)

## 2014-03-11 LAB — IRON AND TIBC
IRON: 76 ug/dL (ref 42–135)
Saturation Ratios: 31 % (ref 20–55)
TIBC: 248 ug/dL (ref 215–435)
UIBC: 172 ug/dL (ref 125–400)

## 2014-03-11 LAB — FERRITIN: Ferritin: 389 ng/mL — ABNORMAL HIGH (ref 22–322)

## 2014-03-11 LAB — POCT HEMOGLOBIN-HEMACUE: Hemoglobin: 7.9 g/dL — ABNORMAL LOW (ref 13.0–17.0)

## 2014-03-11 MED ORDER — EPOETIN ALFA 20000 UNIT/ML IJ SOLN
INTRAMUSCULAR | Status: AC
  Administered 2014-03-11: 20000 [IU] via SUBCUTANEOUS
  Filled 2014-03-11: qty 1

## 2014-03-11 MED ORDER — EPOETIN ALFA 20000 UNIT/ML IJ SOLN: 20000.0000 [IU] | INTRAMUSCULAR | Status: DC

## 2014-03-11 NOTE — Progress Notes (Signed)
Called [facility] regarding hgb 7.9. No order changes.

## 2014-03-12 LAB — PTH, INTACT AND CALCIUM
Calcium, Total (PTH): 9.7 mg/dL (ref 8.4–10.5)
PTH: 63.1 pg/mL (ref 14.0–72.0)

## 2014-03-16 ENCOUNTER — Encounter: Payer: Self-pay | Admitting: Gastroenterology

## 2014-03-17 ENCOUNTER — Encounter: Payer: Self-pay | Admitting: Cardiovascular Disease

## 2014-03-17 ENCOUNTER — Other Ambulatory Visit: Payer: Self-pay | Admitting: *Deleted

## 2014-03-17 DIAGNOSIS — Z01810 Encounter for preprocedural cardiovascular examination: Secondary | ICD-10-CM

## 2014-03-17 DIAGNOSIS — N184 Chronic kidney disease, stage 4 (severe): Secondary | ICD-10-CM

## 2014-03-19 ENCOUNTER — Encounter: Payer: Self-pay | Admitting: Surgery

## 2014-03-22 ENCOUNTER — Ambulatory Visit (INDEPENDENT_AMBULATORY_CARE_PROVIDER_SITE_OTHER)
Admission: RE | Admit: 2014-03-22 | Discharge: 2014-03-22 | Disposition: A | Discharge status: Home / Self Care | Payer: BC Managed Care – PPO | Source: Ambulatory Visit | Attending: Surgery | Admitting: Surgery

## 2014-03-22 ENCOUNTER — Ambulatory Visit (INDEPENDENT_AMBULATORY_CARE_PROVIDER_SITE_OTHER): Payer: BC Managed Care – PPO | Admitting: Surgery

## 2014-03-22 ENCOUNTER — Ambulatory Visit (HOSPITAL_COMMUNITY)
Admission: RE | Admit: 2014-03-22 | Discharge: 2014-03-22 | Disposition: A | Discharge status: Home / Self Care | Payer: BC Managed Care – PPO | Source: Ambulatory Visit | Attending: Surgery | Admitting: Surgery

## 2014-03-22 ENCOUNTER — Encounter: Payer: Self-pay | Admitting: Surgery

## 2014-03-22 ENCOUNTER — Other Ambulatory Visit: Payer: Self-pay | Admitting: Endocrinology

## 2014-03-22 VITALS — BP 127/62 | HR 85 | Resp 20 | Ht 72.0 in | Wt 313.7 lb

## 2014-03-22 DIAGNOSIS — Z992 Dependence on renal dialysis: Secondary | ICD-10-CM

## 2014-03-22 DIAGNOSIS — Z01810 Encounter for preprocedural cardiovascular examination: Secondary | ICD-10-CM

## 2014-03-22 DIAGNOSIS — N1832 Chronic kidney disease, stage 3b: Secondary | ICD-10-CM

## 2014-03-22 DIAGNOSIS — N184 Chronic kidney disease, stage 4 (severe): Secondary | ICD-10-CM | POA: Insufficient documentation

## 2014-03-22 DIAGNOSIS — N186 End stage renal disease: Secondary | ICD-10-CM | POA: Insufficient documentation

## 2014-03-22 HISTORY — DX: Chronic kidney disease, stage 3b: N18.32

## 2014-03-22 NOTE — Progress Notes (Signed)
Referred by:  Corliss Parish, MD [facility]  Reason for referral: New access  History of Present Illness  Robert [name] is a 53 y.o. ([birth year]) male who presents for evaluation for permanent access.  He is not currently on dialysis but will need dialysis in the near future. The patient is left hand dominant.  The patient has not had previous access procedures. He has had no prrevious central venous cannulation procedures. He is currently considering peritoneal dialysis versus hemodialysis.   He describes having intermittent tingling in his hands that he attributes to his anemia. His hemoglobin has declined recently and is currently receiving procrit injections. He also mentions having persistent fatigue.  On ROS, he denies any chest pain or history of non-healing wounds. All other systems are negative.   He takes an 81 mg aspirin every other day. He is a diabetic on insulin. He has hypercholesterolemia on a statin. He has hypertension treated with a beta blocker and hydralazine.   Past Medical History  Diagnosis Date  . Diabetes mellitus without complication   . Chronic kidney disease   . Hypertension   . Thyroid disease   . Sleep apnea 06/18/11    split-night sleep study- [facility].  . Hyperlipidemia   . PAT (paroxysmal atrial tachycardia)   . PSVT (paroxysmal supraventricular tachycardia)     Past Surgical History  Procedure Laterality Date  . Nephrectomy Left 1974  . Renal biopsy Right 2012    History   Social History  . Marital Status: Married    Spouse Name: N/A    Number of Children: N/A  . Years of Education: N/A   Occupational History  . Not on file.   Social History Main Topics  . Smoking status: Former Smoker    Types: Pipe    Quit date: 12/06/2013  . Smokeless tobacco: Never Used  . Alcohol Use: Yes     Comment: 1 glass of wine every other day.  . Drug Use: Not on file  . Sexual Activity: Not on  file   Other Topics Concern  . Not on file   Social History Narrative  . No narrative on file    History reviewed. No pertinent family history.   Current Outpatient Prescriptions on File Prior to Visit  Medication Sig Dispense Refill  . BYSTOLIC 10 MG tablet Take 15 mg by mouth daily.       . furosemide (LASIX) 80 MG tablet Take 80 mg by mouth 2 (two) times daily. Takes 120 mg      . hydrALAZINE (APRESOLINE) 50 MG tablet Take 75 mg by mouth 2 (two) times daily.       . Insulin Glargine (LANTUS SOLOSTAR) 100 UNIT/ML Solostar Pen Inject 26 Units into the skin daily at 10 pm.  15 pen  1  . Insulin Pen Needle 32G X 4 MM MISC Use 4 pen needles per day to inject insulin  250 each  1  . insulin regular human CONCENTRATED (HUMULIN R) 500 UNIT/ML SOLN injection 8/5/8 units daily.  60 mL  1  . levothyroxine (SYNTHROID, LEVOTHROID) 100 MCG tablet Take 1 tablet (100 mcg total) by mouth daily.  90 tablet  1  . Liraglutide (VICTOZA) 18 MG/3ML SOPN Inject 1.2 mg into the skin daily.  6 pen  1  . NON FORMULARY CPAP therapy.      . rosuvastatin (CRESTOR) 20 MG tablet Take 1 tablet (20 mg total) by mouth daily.  90 tablet  1  . sildenafil (VIAGRA) 100 MG tablet Take 100 mg by mouth as needed for erectile dysfunction.      Marland Kitchen desoximetasone (TOPICORT) 0.25 % cream Apply 1 application topically daily.       No current facility-administered medications on file prior to visit.    No Known Allergies   REVIEW OF SYSTEMS:  (Positives checked otherwise negative)  CARDIOVASCULAR:  []  chest pain, []  chest pressure, []  palpitations, []  shortness of breath when laying flat, []  shortness of breath with exertion,  []  pain in feet when walking, []  pain in feet when laying flat, []  history of blood clot in veins (DVT), []  history of phlebitis, []  swelling in legs, []  varicose veins  PULMONARY:  []  productive cough, []  asthma, []  wheezing  NEUROLOGIC:  []  weakness in arms or legs, [x]  numbness in arms or legs, []   difficulty speaking or slurred speech, []  temporary loss of vision in one eye, []  dizziness  HEMATOLOGIC:  []  bleeding problems, []  problems with blood clotting too easily [x]  anemia  MUSCULOSKEL:  []  joint pain, []  joint swelling  GASTROINTEST:  []  vomiting blood, []  blood in stool     GENITOURINARY:  []  burning with urination, []  blood in urine  PSYCHIATRIC:  []  history of major depression  INTEGUMENTARY:  []  rashes, []  ulcers  CONSTITUTIONAL:  []  fever, []  chills  Physical Examination  Filed Vitals:   03/22/14 1326  BP: 127/62  Pulse: 85  Resp: 20  Height: 6' (1.829 m)  Weight: 313 lb 11.2 oz (142.293 kg)   Body mass index is 42.54 kg/(m^2).  General: A&O x 3, WD Obese male in NAD  Head: Dustin/AT  Ear/Nose/Throat: Hearing grossly intact  Eyes: PERRLA, EOMI  Pulmonary: Sym exp, good air movt, CTAB, no rales, rhonchi, & wheezing   Cardiac: RRR, Nl S1, S2, no Murmurs, rubs or gallops   Vascular: Vessel Right Left  Radial Palpable Palpable  Ulnar Palpable Palpable  Brachial Palpable Palpable   Gastrointestinal: soft, NTND, +BS, left transverse scar from previous nephrectomy, umbilical hernia   Musculoskeletal: M/S 5/5 throughout  Neurologic: CN 2-12 intact. Pain and light touch intact in extremities   Psychiatric: Judgment intact, Mood & affect appropriate for pt's clinical situation  Dermatologic: See M/S exam for extremity exam, no rashes otherwise noted  Non-Invasive Vascular Imaging  Vein Mapping  (Date: 03/22/2014):   R arm: acceptable vein conduits include cephalic  L arm: acceptable vein conduits include cephalic  BUE Doppler (Date: 03/22/2014):   R arm:   Brachial: 0.50  Radial: 0.23  Ulnar: 0.30  L arm:   Brachial: 0.55  Radial: 0.23  Ulnar: 0.24   Medical Decision Making  Robert [name] is a 53 y.o. male who presents with chronic kidney disease, stage IV requiring future hemodialysis.   Based on vein mapping and examination,  this patient's permanent access options include: right radio-cephalic av fistula.   Had an extensive discussion with this patient in regards to the nature of access surgery, including risk, benefits, and alternatives.    The patient is aware that the risks of access surgery include but are not limited to: bleeding, infection, steal syndrome, nerve damage, ischemic monomelic neuropathy, failure of access to mature, and possible need for additional access procedures in the future. Discussed with the patient the nature of the staged access procedure, specifically the need for a second operation to transpose the first stage fistula if it matures adequately.    The patient  is undecided between peritoneal dialysis versus hemodialysis. He was advised to decide on one method or the other versus. Referred patient to general surgeon to discuss peritoneal dialysis. Patient will notify the office once he decides which dialysis route he will take.   Virgina Jock, PA-C [facility] Office: [phone] Pager: [phone]  03/22/2014, 2:02 PM   I agree with the above.  The patient has not yet on dialysis.  He is left-handed.  We had a lengthy discussion today regarding his treatment options of hemodialysis vs. peritoneal dialysis.  The patient would like to get more information on peritoneal dialysis, and therefore I have referred him to general surgery for further discussions.  He does have a history of nephrectomy and appendectomy, with a umbilical hernia currently.  He is wanting to be a candidate for home dialysis but his wife is afraid of needles.  I do believe the patient would be a good candidate for a right radiocephalic fistula.  Vein mapping today showed excellent cephalic veins bilaterally.  I discussed the details of the operation including the risk of non-maturity, and the need for additional procedures.  I also discussed the possibility of steal syndrome.  The  patient will contact me should he desire to proceed with fistula creation, which would be a right radiocephalic fistula.  [name]

## 2014-03-23 ENCOUNTER — Other Ambulatory Visit: Payer: Self-pay | Admitting: *Deleted

## 2014-03-23 MED ORDER — INSULIN GLARGINE 100 UNIT/ML SOLOSTAR PEN: 26.0000 [IU] | PEN_INJECTOR | Freq: Every day | SUBCUTANEOUS | Status: DC

## 2014-03-24 ENCOUNTER — Encounter (HOSPITAL_COMMUNITY)
Admission: RE | Admit: 2014-03-24 | Discharge: 2014-03-24 | Disposition: A | Discharge status: Home / Self Care | Payer: BC Managed Care – PPO | Source: Ambulatory Visit | Attending: Nephrology | Admitting: Nephrology

## 2014-03-24 LAB — POCT HEMOGLOBIN-HEMACUE: HEMOGLOBIN: 7.6 g/dL — AB (ref 13.0–17.0)

## 2014-03-24 MED ORDER — EPOETIN ALFA 20000 UNIT/ML IJ SOLN
20000.0000 [IU] | INTRAMUSCULAR | Status: DC
  Administered 2014-03-24: 20000 [IU] via SUBCUTANEOUS

## 2014-03-24 MED ORDER — EPOETIN ALFA 20000 UNIT/ML IJ SOLN
INTRAMUSCULAR | Status: AC
  Filled 2014-03-24: qty 1

## 2014-03-29 ENCOUNTER — Other Ambulatory Visit (HOSPITAL_COMMUNITY): Payer: Self-pay | Admitting: *Deleted

## 2014-03-29 ENCOUNTER — Encounter (HOSPITAL_COMMUNITY): Payer: BC Managed Care – PPO

## 2014-03-30 ENCOUNTER — Encounter (HOSPITAL_COMMUNITY): Admission: RE | Admit: 2014-03-30 | Payer: BC Managed Care – PPO | Source: Ambulatory Visit

## 2014-03-31 ENCOUNTER — Encounter (HOSPITAL_COMMUNITY): Payer: BC Managed Care – PPO

## 2014-03-31 ENCOUNTER — Encounter: Payer: Self-pay | Admitting: Endocrinology

## 2014-03-31 ENCOUNTER — Ambulatory Visit (INDEPENDENT_AMBULATORY_CARE_PROVIDER_SITE_OTHER): Payer: BC Managed Care – PPO | Admitting: Endocrinology

## 2014-03-31 ENCOUNTER — Encounter (HOSPITAL_COMMUNITY)
Admission: RE | Admit: 2014-03-31 | Discharge: 2014-03-31 | Disposition: A | Discharge status: Home / Self Care | Payer: BC Managed Care – PPO | Source: Ambulatory Visit | Attending: Nephrology | Admitting: Nephrology

## 2014-03-31 VITALS — BP 152/84 | HR 93 | Temp 98.0°F | Resp 18 | Ht 72.0 in | Wt 316.8 lb

## 2014-03-31 DIAGNOSIS — E1129 Type 2 diabetes mellitus with other diabetic kidney complication: Secondary | ICD-10-CM

## 2014-03-31 DIAGNOSIS — E039 Hypothyroidism, unspecified: Secondary | ICD-10-CM

## 2014-03-31 DIAGNOSIS — E785 Hyperlipidemia, unspecified: Secondary | ICD-10-CM

## 2014-03-31 DIAGNOSIS — E1165 Type 2 diabetes mellitus with hyperglycemia: Principal | ICD-10-CM

## 2014-03-31 LAB — POCT HEMOGLOBIN-HEMACUE: Hemoglobin: 8.1 g/dL — ABNORMAL LOW (ref 13.0–17.0)

## 2014-03-31 MED ORDER — SODIUM CHLORIDE 0.9 % IV SOLN
1020.0000 mg | Freq: Once | INTRAVENOUS | Status: AC
  Administered 2014-03-31: 1020 mg via INTRAVENOUS
  Filled 2014-03-31: qty 34

## 2014-03-31 MED ORDER — EPOETIN ALFA 20000 UNIT/ML IJ SOLN
20000.0000 [IU] | INTRAMUSCULAR | Status: DC
  Administered 2014-03-31: 20000 [IU] via SUBCUTANEOUS

## 2014-03-31 MED ORDER — EPOETIN ALFA 20000 UNIT/ML IJ SOLN
INTRAMUSCULAR | Status: AC
  Administered 2014-03-31: 20000 [IU] via SUBCUTANEOUS
  Filled 2014-03-31: qty 1

## 2014-03-31 NOTE — Discharge Instructions (Signed)

## 2014-03-31 NOTE — Patient Instructions (Signed)
Please check blood sugars at least half the time about 2 hours after any meal and 4 times per week on waking up.   Check 2-3 times daily Please bring blood sugar monitor to each visit

## 2014-03-31 NOTE — Progress Notes (Signed)
Patient ID: STEFFEN DOHN, male   DOB: 1961/09/26, 53 y.o.   MRN: [id number]   Reason for Appointment: Type II Diabetes follow-up   History of Present Illness   Diagnosis date:  2012  Previous history: His blood sugar has been difficult to control since onset. Initially was taking oral hypoglycemic drugs like glyburide but could not take metformin because of renal dysfunction. Since Victoza did not help his control he was started on Lantus and then U 500 insulin also His A1c has previously ranged from 7.8-9.8, the lowest level in 03/2013  Recent history: He has continued his regimen of basal insulin and U-500 insulin as before  Has changed his Lantus to the morning as discussed because of relatively higher readings during the day and some symptoms of hyperglycemia on waking up He continues to take VICTOZA 1.2 mg Current problems:  Not checking blood sugar after meals consistently. Checking blood sugars mostly in the morning and sometimes midday and late afternoon  Has only one high reading after lunch  Morning sugars are variable with median 148 but as low as 101 today  Has only one reading after her evening meal, these are previously higher  Has regained some of the weight he lost by starting to watching his diet and also decreased appetite  Not able to exercise much because of tendency to dyspnea on exertion  He is concerned about cost of various medications including Victoza     Oral hypoglycemic drugs:        Side effects from medications: None Insulin regimen: LANTUS 26 units In a.m. now. U-500 insulin: 8-5-8. 30 min ac;        Proper timing of medications in relation to meals: Yes.          Monitors blood glucose: Once a day.    Glucometer: One Touch.          Blood Glucose readings from meter download:   PREMEAL Breakfast Lunch Dinner Bedtime Overall  Glucose range:  10 1-192  114-199  118     Median:   184    149     POST-MEAL PC Breakfast PC Lunch PC Dinner   Glucose range:   77-270   190   Mean/median:   144      Hypoglycemia:  none but feels a little weak and shaky if blood sugars are low  normal         Meals: 3 meals per day. Dinner 7 p,m. Currently on the low phosphorus diet and has fewer carbohydrates        Physical activity: exercise: none           Weight control:  Wt Readings from Last 3 Encounters:  03/31/14 316 lb 12.8 oz (143.7 kg)  03/31/14 313 lb (141.976 kg)  03/24/14 310 lb (140.615 kg)          Complications:   nephropathy, neuropathy   Diabetes labs:  Lab Results  Component Value Date   HGBA1C 9.4* 01/04/2014   Lab Results  Component Value Date   MICROALBUR 224.5 Repeated and verified X2.* 01/04/2014   LDLCALC 119* 01/04/2014   CREATININE 11.40* 03/11/2014       Medication List       This list is accurate as of: 03/31/14  4:07 PM.  Always use your most recent med list.               aspirin 81 MG tablet  Take 81 mg by mouth  every other day.     BYSTOLIC 10 MG tablet  Generic drug:  nebivolol  Take 15 mg by mouth daily.     desoximetasone 0.25 % cream  Commonly known as:  TOPICORT  Apply 1 application topically daily.     docusate sodium 100 MG capsule  Commonly known as:  COLACE  Take 100 mg by mouth 2 (two) times daily.     ezetimibe 10 MG tablet  Commonly known as:  ZETIA  Take 10 mg by mouth daily.     fluocinonide cream 0.05 %  Commonly known as:  LIDEX  Apply 1 application topically daily.     furosemide 80 MG tablet  Commonly known as:  LASIX  Take 80 mg by mouth 2 (two) times daily. Takes 120 mg     hydrALAZINE 50 MG tablet  Commonly known as:  APRESOLINE  Take 75 mg by mouth 2 (two) times daily.     Insulin Glargine 100 UNIT/ML Solostar Pen  Commonly known as:  LANTUS SOLOSTAR  Inject 26 Units into the skin daily at 10 pm.     Insulin Pen Needle 32G X 4 MM Misc  Use 4 pen needles per day to inject insulin     insulin regular human CONCENTRATED 500 UNIT/ML Soln injection   Commonly known as:  HUMULIN R  8/5/8 units daily.     levothyroxine 100 MCG tablet  Commonly known as:  SYNTHROID, LEVOTHROID  Take 1 tablet (100 mcg total) by mouth daily.     Liraglutide 18 MG/3ML Sopn  Commonly known as:  VICTOZA  Inject 1.2 mg into the skin daily.     NON FORMULARY  CPAP therapy.     rosuvastatin 20 MG tablet  Commonly known as:  CRESTOR  Take 1 tablet (20 mg total) by mouth daily.     senna 8.6 MG tablet  Commonly known as:  SENOKOT  Take 1 tablet by mouth daily.     sildenafil 100 MG tablet  Commonly known as:  VIAGRA  Take 100 mg by mouth as needed for erectile dysfunction.     verapamil 240 MG CR tablet  Commonly known as:  CALAN-SR  Take 240 mg by mouth daily. Takes 1/2 tablet daily     Vitamin D 2000 UNITS tablet  Take 2,000 Units by mouth daily.     vitamin E 400 UNIT capsule  Take 400 Units by mouth daily.        Allergies: No Known Allergies  Past Medical History  Diagnosis Date  . Diabetes mellitus without complication   . Chronic kidney disease   . Hypertension   . Thyroid disease   . Sleep apnea 06/18/11    split-night sleep study- [facility].  . Hyperlipidemia   . PAT (paroxysmal atrial tachycardia)   . PSVT (paroxysmal supraventricular tachycardia)     Past Surgical History  Procedure Laterality Date  . Nephrectomy Left 1974  . Renal biopsy Right 2012    No family history on file.  Social History:  reports that he quit smoking about 3 months ago. His smoking use included Pipe. He has never used smokeless tobacco. He reports that he drinks alcohol. His drug history is not on file.  Review of Systems:  Hypertension:  BP at home 130/80-90 in the mornings, later in the day 150/100-110. His blood pressure has been managed by his cardiologist mostly  Lipids: He has been unable to get consistent control. Was switched from  Lipitor to Crestor because of relatively high LDL levels. Also subsequently  Zetia was added to get LDL to target. However he is concerned about the cost   Lab Results  Component Value Date   CHOL 174 01/04/2014   HDL 38.80* 01/04/2014   LDLCALC 119* 01/04/2014   TRIG 83.0 01/04/2014   CHOLHDL 4 01/04/2014    He tends to get short of breath on exertion.  Thyroid: He has had long-standing hypothyroidism   TSH had been previously consistent. 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    ASSESSMENT/ PLAN:    Diabetes type 2:  Previously has had difficulty controlling his glucose adequately even with multiple insulin doses adding up to over 100 units a day His blood sugars appear to be relatively better than the last time but need to have A1c to confirm improvement Although he has some high readings in the mornings they are averaging less than 150 and not clear where he has significant hyperglycemia as discussed in history of present illness His nonfasting readings have been better in the last week or so  He has lost weight but still appears insulin resistant  Recommendations made today:  Blood glucose control needs to be assessed with an A1c today  Check readings after meals especially supper more often  Consider insulin pump and this may be planned after he decides on his dialysis regimen. Given basic information on this  Continue current regimen but consider increasing Lantus if on  sugars again start getting higher  If he starts peritoneal dialysis may benefit from adding insulin to his dialysis fluid  HYPERTENSION: Appears isomewhat bettercontrolled according to his home readings  Counseling time over 50% of today's 25 minute visit  KUMAR,AJAY 03/31/2014, 4:07 PM

## 2014-04-01 LAB — LIPID PANEL
Cholesterol: 97 mg/dL (ref 0–200)
HDL: 32.6 mg/dL — AB (ref >39.00)
LDL CALC: 20 mg/dL (ref 0–99)
NonHDL: 64.4
TRIGLYCERIDES: 221 mg/dL — AB (ref 0.0–149.0)
Total CHOL/HDL Ratio: 3
VLDL: 44.2 mg/dL — AB (ref 0.0–40.0)

## 2014-04-01 LAB — COMPREHENSIVE METABOLIC PANEL
ALK PHOS: 46 U/L (ref 39–117)
ALT: 13 U/L (ref 0–53)
AST: 15 U/L (ref 0–37)
Albumin: 4.1 g/dL (ref 3.5–5.2)
BUN: 74 mg/dL — AB (ref 6–23)
CO2: 22 meq/L (ref 19–32)
CREATININE: 10.1 mg/dL — AB (ref 0.4–1.5)
Calcium: 8.7 mg/dL (ref 8.4–10.5)
Chloride: 103 mEq/L (ref 96–112)
GFR: 5.75 mL/min — CL (ref >60.00)
Glucose, Bld: 149 mg/dL — ABNORMAL HIGH (ref 70–99)
Potassium: 4 mEq/L (ref 3.5–5.1)
SODIUM: 138 meq/L (ref 135–145)
TOTAL PROTEIN: 7 g/dL (ref 6.0–8.3)
Total Bilirubin: 0.7 mg/dL (ref 0.2–1.2)

## 2014-04-01 LAB — HEMOGLOBIN A1C: Hgb A1c MFr Bld: 7 % — ABNORMAL HIGH (ref 4.6–6.5)

## 2014-04-01 NOTE — Progress Notes (Signed)
Quick Note:  A1c 7%, improved, good cholesterol is still low, needs exercise and weight loss ______

## 2014-04-07 ENCOUNTER — Encounter (HOSPITAL_COMMUNITY): Payer: BC Managed Care – PPO

## 2014-04-07 ENCOUNTER — Encounter (HOSPITAL_COMMUNITY)
Admission: RE | Admit: 2014-04-07 | Discharge: 2014-04-07 | Disposition: A | Discharge status: Home / Self Care | Payer: BC Managed Care – PPO | Source: Ambulatory Visit | Attending: Nephrology | Admitting: Nephrology

## 2014-04-07 ENCOUNTER — Ambulatory Visit (INDEPENDENT_AMBULATORY_CARE_PROVIDER_SITE_OTHER): Payer: Self-pay | Admitting: General Surgery

## 2014-04-07 DIAGNOSIS — N184 Chronic kidney disease, stage 4 (severe): Secondary | ICD-10-CM | POA: Insufficient documentation

## 2014-04-07 DIAGNOSIS — Z5181 Encounter for therapeutic drug level monitoring: Secondary | ICD-10-CM | POA: Insufficient documentation

## 2014-04-07 LAB — RENAL FUNCTION PANEL
Albumin: 3.7 g/dL (ref 3.5–5.2)
Anion gap: 23 — ABNORMAL HIGH (ref 5–15)
BUN: 84 mg/dL — AB (ref 6–23)
CALCIUM: 8.6 mg/dL (ref 8.4–10.5)
CO2: 21 meq/L (ref 19–32)
Chloride: 95 mEq/L — ABNORMAL LOW (ref 96–112)
Creatinine, Ser: 10.55 mg/dL — ABNORMAL HIGH (ref 0.50–1.35)
GFR calc Af Amer: 6 mL/min — ABNORMAL LOW (ref >90)
GFR calc non Af Amer: 5 mL/min — ABNORMAL LOW (ref >90)
GLUCOSE: 234 mg/dL — AB (ref 70–99)
PHOSPHORUS: 8.5 mg/dL — AB (ref 2.3–4.6)
Potassium: 3.9 mEq/L (ref 3.7–5.3)
SODIUM: 139 meq/L (ref 137–147)

## 2014-04-07 LAB — POCT HEMOGLOBIN-HEMACUE: Hemoglobin: 8.6 g/dL — ABNORMAL LOW (ref 13.0–17.0)

## 2014-04-07 LAB — IRON AND TIBC
Iron: 75 ug/dL (ref 42–135)
SATURATION RATIOS: 29 % (ref 20–55)
TIBC: 259 ug/dL (ref 215–435)
UIBC: 184 ug/dL (ref 125–400)

## 2014-04-07 LAB — FERRITIN: Ferritin: 525 ng/mL — ABNORMAL HIGH (ref 22–322)

## 2014-04-07 MED ORDER — EPOETIN ALFA 20000 UNIT/ML IJ SOLN
INTRAMUSCULAR | Status: AC
  Administered 2014-04-07: 20000 [IU] via SUBCUTANEOUS
  Filled 2014-04-07: qty 1

## 2014-04-07 MED ORDER — EPOETIN ALFA 20000 UNIT/ML IJ SOLN
20000.0000 [IU] | INTRAMUSCULAR | Status: DC
  Administered 2014-04-07: 20000 [IU] via SUBCUTANEOUS

## 2014-04-08 LAB — PTH, INTACT AND CALCIUM
Calcium, Total (PTH): 8.5 mg/dL (ref 8.4–10.5)
PTH: 246.8 pg/mL — AB (ref 14.0–72.0)

## 2014-04-14 ENCOUNTER — Encounter (HOSPITAL_COMMUNITY)
Admission: RE | Admit: 2014-04-14 | Discharge: 2014-04-14 | Disposition: A | Discharge status: Home / Self Care | Payer: BC Managed Care – PPO | Source: Ambulatory Visit | Attending: Nephrology | Admitting: Nephrology

## 2014-04-14 MED ORDER — EPOETIN ALFA 20000 UNIT/ML IJ SOLN
20000.0000 [IU] | INTRAMUSCULAR | Status: DC
  Administered 2014-04-14: 20000 [IU] via SUBCUTANEOUS

## 2014-04-14 MED ORDER — EPOETIN ALFA 20000 UNIT/ML IJ SOLN
INTRAMUSCULAR | Status: AC
  Filled 2014-04-14: qty 1

## 2014-04-15 ENCOUNTER — Ambulatory Visit (INDEPENDENT_AMBULATORY_CARE_PROVIDER_SITE_OTHER): Payer: BC Managed Care – PPO | Admitting: General Surgery

## 2014-04-15 ENCOUNTER — Encounter (INDEPENDENT_AMBULATORY_CARE_PROVIDER_SITE_OTHER): Payer: Self-pay | Admitting: General Surgery

## 2014-04-15 VITALS — BP 152/94 | HR 71 | Temp 97.4°F | Resp 18 | Ht 72.0 in | Wt 320.0 lb

## 2014-04-15 DIAGNOSIS — N186 End stage renal disease: Secondary | ICD-10-CM

## 2014-04-15 LAB — POCT HEMOGLOBIN-HEMACUE: Hemoglobin: 9.4 g/dL — ABNORMAL LOW (ref 13.0–17.0)

## 2014-04-15 NOTE — Progress Notes (Signed)
Patient ID: ETHANJAMES WERLING, male   DOB: May 17, 1961, 53 y.o.   MRN: [id number]  No chief complaint on file.   HPI JAHMIERE HEARN is a 53 y.o. male.  The patient is a 53 year old male with a history of chronic kidney disease he sees Dr. Clover Mealy. The patient recently saw Dr. Trula Slade for evaluation hemodialysis. 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  The patient does have a history of left nephrectomy, splenorrhaphy, appendectomy, and questionable intestinal injury at time of splenorrhaphy in 1974. The patient is unsure of what that consisted of.  HPI  Past Medical History  Diagnosis Date  . Diabetes mellitus without complication   . Chronic kidney disease   . Hypertension   . Thyroid disease   . Sleep apnea 06/18/11    split-night sleep study- [facility].  . Hyperlipidemia   . PAT (paroxysmal atrial tachycardia)   . PSVT (paroxysmal supraventricular tachycardia)     Past Surgical History  Procedure Laterality Date  . Nephrectomy Left 1974  . Renal biopsy Right 2012    No family history on file.  Social History History  Substance Use Topics  . Smoking status: Former Smoker    Types: Pipe    Quit date: 12/06/2013  . Smokeless tobacco: Never Used  . Alcohol Use: Yes     Comment: 1 glass of wine every other day.    No Known Allergies  Current Outpatient Prescriptions  Medication Sig Dispense Refill  . aspirin 81 MG tablet Take 81 mg by mouth every other day.      Marland Kitchen BYSTOLIC 10 MG tablet Take 15 mg by mouth daily.       . Cholecalciferol (VITAMIN D) 2000 UNITS tablet Take 2,000 Units by mouth daily.      Marland Kitchen desoximetasone (TOPICORT) 0.25 % cream Apply 1 application topically daily.      Marland Kitchen docusate sodium (COLACE) 100 MG capsule Take 100 mg by mouth 2 (two) times daily.      Marland Kitchen ezetimibe (ZETIA) 10 MG tablet Take 10 mg  by mouth daily.      . fluocinonide cream (LIDEX) [id number] % Apply 1 application topically daily.      . furosemide (LASIX) 80 MG tablet Take 80 mg by mouth 2 (two) times daily. Takes 120 mg      . hydrALAZINE (APRESOLINE) 50 MG tablet Take 75 mg by mouth 2 (two) times daily.       . Insulin Glargine (LANTUS SOLOSTAR) 100 UNIT/ML Solostar Pen Inject 26 Units into the skin daily at 10 pm.  15 pen  1  . Insulin Pen Needle 32G X 4 MM MISC Use 4 pen needles per day to inject insulin  250 each  1  . insulin regular human CONCENTRATED (HUMULIN R) 500 UNIT/ML SOLN injection 8/5/8 units daily.  60 mL  1  . levothyroxine (SYNTHROID, LEVOTHROID) 100 MCG tablet Take 1 tablet (100 mcg total) by mouth daily.  90 tablet  1  . Liraglutide (VICTOZA) 18 MG/3ML SOPN Inject 1.2 mg into the skin daily.  6 pen  1  . NON FORMULARY CPAP therapy.      . rosuvastatin (CRESTOR) 20 MG tablet Take 1 tablet (20 mg total) by mouth daily.  90 tablet  1  . senna (SENOKOT) 8.6 MG tablet Take 1 tablet by mouth daily.      Marland Kitchen  sildenafil (VIAGRA) 100 MG tablet Take 100 mg by mouth as needed for erectile dysfunction.      . verapamil (CALAN-SR) 240 MG CR tablet Take 240 mg by mouth daily. Takes 1/2 tablet daily      . vitamin E 400 UNIT capsule Take 400 Units by mouth daily.       No current facility-administered medications for this visit.    Review of Systems Review of Systems  Constitutional: Negative.   HENT: Negative.   Eyes: Negative.   Respiratory: Negative.   Cardiovascular: Negative.   Gastrointestinal: Negative.   Endocrine: Negative.   Neurological: Negative.     Blood pressure 152/94, pulse 71, temperature 97.4 F (36.3 C), temperature source Temporal, resp. rate 18, height 6' (1.829 m), weight 320 lb (145.151 kg).  Physical Exam Physical Exam  Constitutional: He is oriented to person, place, and time. He appears well-developed and well-nourished.  HENT:  Head: Normocephalic and atraumatic.  Eyes:  Conjunctivae and EOM are normal. Pupils are equal, round, and reactive to light.  Neck: Normal range of motion. Neck supple.  Cardiovascular: Normal rate, regular rhythm and normal heart sounds.   Pulmonary/Chest: Effort normal and breath sounds normal.  Abdominal: A hernia is present. Hernia confirmed positive in the ventral area.    Musculoskeletal: Normal range of motion.  Neurological: He is alert and oriented to person, place, and time.  Skin: Skin is warm and dry.    Data Reviewed none  Assessment    53 year old male with chronic kidney disease. Patient here for evaluation of the peritoneal dialysis placement.  Pt also with a reducible umbilical hernia    Plan    1 we will proceed to the operating room for laparoscopic peritoneal  Dialysis  catheter placement.  2. Discussed with patient the risks and benefits of the procedure to include but not limited to: Infection, bleeding, ensuring structures, malfunction of the peritoneal dialysis catheter, need for possible reoperation to troubleshoot the peritoneal dialysis catheter, and possible need for removal. The patient voiced understanding and wished to proceed.       Rosario Jacks., Mekisha Bittel 04/15/2014, 9:44 AM

## 2014-04-22 ENCOUNTER — Encounter: Payer: Self-pay | Admitting: Cardiovascular Disease

## 2014-04-22 ENCOUNTER — Encounter (HOSPITAL_COMMUNITY)
Admission: RE | Admit: 2014-04-22 | Discharge: 2014-04-22 | Disposition: A | Discharge status: Home / Self Care | Payer: BC Managed Care – PPO | Source: Ambulatory Visit | Attending: Nephrology | Admitting: Nephrology

## 2014-04-22 LAB — POCT HEMOGLOBIN-HEMACUE: Hemoglobin: 8.8 g/dL — ABNORMAL LOW (ref 13.0–17.0)

## 2014-04-22 MED ORDER — EPOETIN ALFA 20000 UNIT/ML IJ SOLN
INTRAMUSCULAR | Status: AC
  Filled 2014-04-22: qty 1

## 2014-04-22 MED ORDER — EPOETIN ALFA 20000 UNIT/ML IJ SOLN
20000.0000 [IU] | INTRAMUSCULAR | Status: DC
  Administered 2014-04-22: 20000 [IU] via SUBCUTANEOUS

## 2014-04-23 ENCOUNTER — Telehealth: Payer: Self-pay | Admitting: *Deleted

## 2014-04-23 NOTE — Telephone Encounter (Signed)
Dr Ardis Hughs: pt is scheduled for direct screening colonoscopy 05/10/14 and coming in for [facility] Monday 7/20.  Would you review chart; pt has complicated medical hx.  Will be having catheter inserted on 8/11 to begin peritoneal dailysis.  Do you want him to have OV with you before scheduling screening colon?  Thanks, Juliann Pulse

## 2014-04-26 ENCOUNTER — Ambulatory Visit (AMBULATORY_SURGERY_CENTER): Payer: Self-pay | Admitting: *Deleted

## 2014-04-26 VITALS — Ht 72.0 in | Wt 323.8 lb

## 2014-04-26 DIAGNOSIS — Z1211 Encounter for screening for malignant neoplasm of colon: Secondary | ICD-10-CM

## 2014-04-26 MED ORDER — MOVIPREP 100 G PO SOLR: ORAL | Status: DC

## 2014-04-26 NOTE — Progress Notes (Signed)
Patient denies any allergies to eggs or soy. Patient denies any problems with anesthesia/sedation. Patient denies any oxygen use at home and does not take any diet/weight loss medications. EMMI education assisgned to patient on colonoscopy, this was explained and instructions given to patient. Phone note was sent to Dr.Jacobs, should patient have OV or direct colon ok? Waiting on response. PV completed and patient notified that we will call him.

## 2014-04-27 NOTE — Telephone Encounter (Signed)
Yes, needs office visit before scheduling a colonoscopy.  Thanks

## 2014-04-27 NOTE — Telephone Encounter (Signed)
New pt appt scheduled with Dr Ardis Hughs for 10/2 at 8:30.  Colonoscopy scheduled for 8/3 cancelled.  Pt aware of appt. changes

## 2014-04-28 ENCOUNTER — Encounter (HOSPITAL_COMMUNITY)
Admission: RE | Admit: 2014-04-28 | Discharge: 2014-04-28 | Disposition: A | Discharge status: Home / Self Care | Payer: BC Managed Care – PPO | Source: Ambulatory Visit | Attending: Nephrology | Admitting: Nephrology

## 2014-04-28 LAB — POCT HEMOGLOBIN-HEMACUE: HEMOGLOBIN: 9.7 g/dL — AB (ref 13.0–17.0)

## 2014-04-28 MED ORDER — EPOETIN ALFA 20000 UNIT/ML IJ SOLN
INTRAMUSCULAR | Status: AC
  Filled 2014-04-28: qty 1

## 2014-04-28 MED ORDER — EPOETIN ALFA 20000 UNIT/ML IJ SOLN
20000.0000 [IU] | INTRAMUSCULAR | Status: DC
  Administered 2014-04-28: 20000 [IU] via SUBCUTANEOUS

## 2014-04-29 ENCOUNTER — Other Ambulatory Visit: Payer: Self-pay | Admitting: Nephrology

## 2014-04-29 DIAGNOSIS — Z01818 Encounter for other preprocedural examination: Secondary | ICD-10-CM

## 2014-04-29 DIAGNOSIS — N184 Chronic kidney disease, stage 4 (severe): Secondary | ICD-10-CM

## 2014-04-30 ENCOUNTER — Encounter: Payer: Self-pay | Admitting: Cardiovascular Disease

## 2014-05-04 ENCOUNTER — Telehealth: Payer: Self-pay

## 2014-05-04 ENCOUNTER — Encounter (HOSPITAL_COMMUNITY): Payer: Self-pay | Admitting: Pharmacy Technician

## 2014-05-04 NOTE — Telephone Encounter (Signed)
Diabetic Bundle. Called and instructed pt too call back to have visit with Endocrinologist Dr. Dwyane Dee scheduled.

## 2014-05-05 ENCOUNTER — Ambulatory Visit
Admission: RE | Admit: 2014-05-05 | Discharge: 2014-05-05 | Disposition: A | Discharge status: Home / Self Care | Payer: BC Managed Care – PPO | Source: Ambulatory Visit | Attending: Nephrology | Admitting: Nephrology

## 2014-05-05 DIAGNOSIS — N184 Chronic kidney disease, stage 4 (severe): Secondary | ICD-10-CM

## 2014-05-05 DIAGNOSIS — Z01818 Encounter for other preprocedural examination: Secondary | ICD-10-CM

## 2014-05-06 ENCOUNTER — Encounter (HOSPITAL_COMMUNITY)
Admission: RE | Admit: 2014-05-06 | Discharge: 2014-05-06 | Disposition: A | Discharge status: Home / Self Care | Payer: BC Managed Care – PPO | Source: Ambulatory Visit | Attending: Nephrology | Admitting: Nephrology

## 2014-05-06 LAB — POCT HEMOGLOBIN-HEMACUE: Hemoglobin: 9.7 g/dL — ABNORMAL LOW (ref 13.0–17.0)

## 2014-05-06 MED ORDER — EPOETIN ALFA 20000 UNIT/ML IJ SOLN
INTRAMUSCULAR | Status: AC
  Filled 2014-05-06: qty 1

## 2014-05-06 MED ORDER — EPOETIN ALFA 20000 UNIT/ML IJ SOLN
20000.0000 [IU] | INTRAMUSCULAR | Status: DC
  Administered 2014-05-06: 20000 [IU] via SUBCUTANEOUS

## 2014-05-07 ENCOUNTER — Telehealth (INDEPENDENT_AMBULATORY_CARE_PROVIDER_SITE_OTHER): Payer: Self-pay

## 2014-05-07 NOTE — Telephone Encounter (Signed)
Harlon Ditty op [phone] needs orders for patient

## 2014-05-10 ENCOUNTER — Encounter: Payer: BC Managed Care – PPO | Admitting: Gastroenterology

## 2014-05-10 ENCOUNTER — Other Ambulatory Visit (INDEPENDENT_AMBULATORY_CARE_PROVIDER_SITE_OTHER): Payer: Self-pay | Admitting: General Surgery

## 2014-05-10 NOTE — Pre-Procedure Instructions (Signed)
Robert [name]  05/10/2014   Your procedure is scheduled on:  05/18/14  Report to [facility] short stay admitting at 845 AM.  Call this number if you have problems the morning of surgery: [phone]   Remember:   Do not eat food or drink liquids after midnight.   Take these medicines the morning of surgery with A SIP OF WATER: bystolic, hydralazine, levothyroxine       STOP all herbel meds, nsaids (aleve,naproxen,advil,ibuprofen) 5 days prior to surgery(05/13/14) including aspirin, vitamins     NO INSULIN AM OF SURGERY   Do not wear jewelry, make-up or nail polish.  Do not wear lotions, powders, or perfumes. You may wear deodorant.  Do not shave 48 hours prior to surgery. Men may shave face and neck.  Do not bring valuables to the hospital.  [facility] is not responsible                  for any belongings or valuables.               Contacts, dentures or bridgework may not be worn into surgery.  Leave suitcase in the car. After surgery it may be brought to your room.  For patients admitted to the hospital, discharge time is determined by your                treatment team.               Patients discharged the day of surgery will not be allowed to drive  home.  Name and phone number of your driver:   Special Instructions:  Special Instructions: [place] - Preparing for Surgery  Before surgery, you can play an important role.  Because skin is not sterile, your skin needs to be as free of germs as possible.  You can reduce the number of germs on you skin by washing with CHG (chlorahexidine gluconate) soap before surgery.  CHG is an antiseptic cleaner which kills germs and bonds with the skin to continue killing germs even after washing.  Please DO NOT use if you have an allergy to CHG or antibacterial soaps.  If your skin becomes reddened/irritated stop using the CHG and inform your nurse when you arrive at Short Stay.  Do not shave (including legs and underarms) for at least 48  hours prior to the first CHG shower.  You may shave your face.  Please follow these instructions carefully:   1.  Shower with CHG Soap the night before surgery and the morning of Surgery.  2.  If you choose to wash your hair, wash your hair first as usual with your normal shampoo.  3.  After you shampoo, rinse your hair and body thoroughly to remove the Shampoo.  4.  Use CHG as you would any other liquid soap.  You can apply chg directly  to the skin and wash gently with scrungie or a clean washcloth.  5.  Apply the CHG Soap to your body ONLY FROM THE NECK DOWN.  Do not use on open wounds or open sores.  Avoid contact with your eyes ears, mouth and genitals (private parts).  Wash genitals (private parts)       with your normal soap.  6.  Wash thoroughly, paying special attention to the area where your surgery will be performed.  7.  Thoroughly rinse your body with warm water from the neck down.  8.  DO NOT shower/wash with your normal soap  after using and rinsing off the CHG Soap.  9.  Pat yourself dry with a clean towel.            10.  Wear clean pajamas.            11.  Place clean sheets on your bed the night of your first shower and do not sleep with pets.  Day of Surgery  Do not apply any lotions/deodorants the morning of surgery.  Please wear clean clothes to the hospital/surgery center.   Please read over the following fact sheets that you were given: Pain Booklet, Coughing and Deep Breathing and Surgical Site Infection Prevention

## 2014-05-11 ENCOUNTER — Ambulatory Visit (HOSPITAL_COMMUNITY)
Admission: RE | Admit: 2014-05-11 | Discharge: 2014-05-11 | Disposition: A | Discharge status: Home / Self Care | Payer: BC Managed Care – PPO | Source: Ambulatory Visit | Attending: Anesthesiology | Admitting: Anesthesiology

## 2014-05-11 ENCOUNTER — Encounter (HOSPITAL_COMMUNITY): Payer: Self-pay

## 2014-05-11 ENCOUNTER — Encounter (HOSPITAL_COMMUNITY)
Admission: RE | Admit: 2014-05-11 | Discharge: 2014-05-11 | Disposition: A | Discharge status: Home / Self Care | Payer: BC Managed Care – PPO | Source: Ambulatory Visit | Attending: General Surgery | Admitting: General Surgery

## 2014-05-11 DIAGNOSIS — R091 Pleurisy: Secondary | ICD-10-CM | POA: Insufficient documentation

## 2014-05-11 DIAGNOSIS — R0602 Shortness of breath: Secondary | ICD-10-CM | POA: Insufficient documentation

## 2014-05-11 DIAGNOSIS — R911 Solitary pulmonary nodule: Secondary | ICD-10-CM | POA: Insufficient documentation

## 2014-05-11 HISTORY — DX: Cardiac arrhythmia, unspecified: I49.9

## 2014-05-11 HISTORY — DX: Headache: R51

## 2014-05-11 HISTORY — DX: Hypothyroidism, unspecified: E03.9

## 2014-05-11 HISTORY — DX: Shortness of breath: R06.02

## 2014-05-11 HISTORY — DX: Malignant (primary) neoplasm, unspecified: C80.1

## 2014-05-11 HISTORY — DX: Umbilical hernia without obstruction or gangrene: K42.9

## 2014-05-11 HISTORY — DX: Anemia, unspecified: D64.9

## 2014-05-11 MED ORDER — CHLORHEXIDINE GLUCONATE 4 % EX LIQD: 1.0000 "application " | Freq: Once | CUTANEOUS | Status: DC

## 2014-05-11 NOTE — Pre-Procedure Instructions (Signed)
Robert [name]  05/11/2014   Your procedure is scheduled on:  05/18/14  Report to [facility] short stay admitting at 845 AM.  Call this number if you have problems the morning of surgery: [phone]   Remember:   Do not eat food or drink liquids after midnight.   Take these medicines the morning of surgery with A SIP OF WATER: bystolic, hydralazine, levothyroxine       STOP all herbel meds, nsaids (aleve,naproxen,advil,ibuprofen) 5 days prior to surgery(05/13/14) including aspirin, vitamins     NO INSULIN AM OF SURGERY   Do not wear jewelry, make-up or nail polish.  Do not wear lotions, powders, or perfumes. You may wear deodorant.  Do not shave 48 hours prior to surgery. Men may shave face and neck.  Do not bring valuables to the hospital.  [facility] is not responsible                  for any belongings or valuables.               Contacts, dentures or bridgework may not be worn into surgery.  Leave suitcase in the car. After surgery it may be brought to your room.  For patients admitted to the hospital, discharge time is determined by your                treatment team.               Patients discharged the day of surgery will not be allowed to drive  home.  Name and phone number of your driver:   Special Instructions:  Special Instructions: [place] - Preparing for Surgery  Before surgery, you can play an important role.  Because skin is not sterile, your skin needs to be as free of germs as possible.  You can reduce the number of germs on you skin by washing with CHG (chlorahexidine gluconate) soap before surgery.  CHG is an antiseptic cleaner which kills germs and bonds with the skin to continue killing germs even after washing.  Please DO NOT use if you have an allergy to CHG or antibacterial soaps.  If your skin becomes reddened/irritated stop using the CHG and inform your nurse when you arrive at Short Stay.  Do not shave (including legs and underarms) for at least 48  hours prior to the first CHG shower.  You may shave your face.  Please follow these instructions carefully:   1.  Shower with CHG Soap the night before surgery and the morning of Surgery.  2.  If you choose to wash your hair, wash your hair first as usual with your normal shampoo.  3.  After you shampoo, rinse your hair and body thoroughly to remove the Shampoo.  4.  Use CHG as you would any other liquid soap.  You can apply chg directly  to the skin and wash gently with scrungie or a clean washcloth.  5.  Apply the CHG Soap to your body ONLY FROM THE NECK DOWN.  Do not use on open wounds or open sores.  Avoid contact with your eyes ears, mouth and genitals (private parts).  Wash genitals (private parts)       with your normal soap.  6.  Wash thoroughly, paying special attention to the area where your surgery will be performed.  7.  Thoroughly rinse your body with warm water from the neck down.  8.  DO NOT shower/wash with your normal soap  after using and rinsing off the CHG Soap.  9.  Pat yourself dry with a clean towel.            10.  Wear clean pajamas.            11.  Place clean sheets on your bed the night of your first shower and do not sleep with pets.  Day of Surgery  Do not apply any lotions/deodorants the morning of surgery.  Please wear clean clothes to the hospital/surgery center.   Please read over the following fact sheets that you were given: Pain Booklet, Coughing and Deep Breathing and Surgical Site Infection Prevention

## 2014-05-12 NOTE — Progress Notes (Addendum)
Anesthesia Chart Review:  Patient is a 53 year old male scheduled for laparoscopic peritoneal dialysis catheter placement on 05/18/14 by Dr. Rosendo Gros. Case is currently scheduled for 10:45 AM.  History includes former smoker, DM2, PSVT and paroxysmal a-tachycardia '07, OSA with CPAP/Bi-PAP, HTN, GERD, anemia, chronic SOB, hypothyroidism, left nephrectomy (for congenital non-functioning kidney)  '74, CKD stage IV (not yet on dialysis), skin cancer excision, headaches, umbilical hernia.  Cardiologist is Dr. Shelva Majestic, last visit 09/06/13. Nephrologist is Dr. Moshe Cipro. PCP is listed as Dr. Ihor Gully.  Endocrinologist is Dr. Dwyane Dee.  DM meds: Lantus 26 Units Q AM (~ 7AM). Victoza 1.2 mg Q AM (~ 7 AM). Humulin 500 Unit/ml 8 Units with breakfast, 5 Units with lunch, 8 Units with supper. He reports a fasting glucose ~ 70-120.  EKG on 08/18/13 showed: NSR.   Echo on 01/14/14 showed: - Left ventricle: The cavity size was normal. There was moderate concentric hypertrophy. Systolic function was normal. The estimated ejection fraction was in the range of 60% to 65%. Wall motion was normal; there were no regional wall motion abnormalities. Doppler parameters are consistent with abnormal left ventricular relaxation (grade 1 diastolic dysfunction). - Aortic valve: Trivial regurgitation. - Aorta: The aorta was normal, not dilated, and non-diseased. - Mitral valve: No regurgitation. - Left atrium: The atrium was mildly dilated. - Right ventricle: Systolic function was normal. - Right atrium: The atrium was normal in size. - Atrial septum: No defect or patent foramen ovale was identified. - Tricuspid valve: No regurgitation. - Pulmonic valve: No regurgitation. - Pericardium, extracardiac: There was no pericardial effusion.  According to Dr. Evette Georges note, "A nuclear perfusion study in August 2012 showed normal perfusion." (Report requested)  CXR on 05/11/14 showed: Concern for progression of the bilateral  pleural thickening. Nodular densities in the lung. The 1 cm nodule in the left upper lung has minimally changed and question a new nodular density along the lateral right chest. Prominent interstitial densities could represent mild edema. Recommend a chest CT with IV contrast to further evaluate the pleural-based densities and nodular densities. In addition, the soft tissue fullness in the right paratracheal region could be further characterized with the chest CT. (I called report to triage nurse Tonya at [facility].  She will have Dr. Rosendo Gros review for additional follow-up recommendations.)  Labs were not done at [facility] for day of surgery entered.  He's not on dialysis yet and there is no recent CBC (just hgb), so I'll order a CBC and BMET. He has known anemia and is on Procrit. (He is scheduled for a lab draw with Procrit on 05/13/14.  I asked Medical Day Staff if they could draw his CBC and BMET tomorrow.) He is also scheduled to see Dr. Ardis Hughs for screening colonoscopy in the near future.  Patient was seen by cardiology within the past year. Normal LVEF and wall motion by recent echo. Normal stress test 3 years ago.  Last EKG showed NSR.  His reported fasting glucose appears acceptable. Patient was instructed to take DM meds as usual the day before surgery with plans to hold morning DM medications/insulin on the day of surgery.  (I contacted Dr. Dwyane Dee, and he agreed with these recommendations.)  If no acute respiratory symptoms then I think his abnormal CXR can be evaluated post-operatively--but will defer to Dr. Rosendo Gros.    George Hugh [facility] Short Stay Center/Anesthesiology Phone [phone] 05/12/2014 3:19 PM  Addendum 05/13/2014 9:18 AM: Patient canceled his Procrit appointment for today, so  labs will be done on the day of surgery.

## 2014-05-13 ENCOUNTER — Inpatient Hospital Stay (HOSPITAL_COMMUNITY): Admission: RE | Admit: 2014-05-13 | Payer: BC Managed Care – PPO | Source: Ambulatory Visit

## 2014-05-17 MED ORDER — CEFAZOLIN SODIUM 10 G IJ SOLR
3.0000 g | INTRAMUSCULAR | Status: AC
  Administered 2014-05-18: 3 g via INTRAVENOUS
  Filled 2014-05-17: qty 3000

## 2014-05-18 ENCOUNTER — Encounter (HOSPITAL_COMMUNITY): Payer: Self-pay | Admitting: *Deleted

## 2014-05-18 ENCOUNTER — Encounter (HOSPITAL_COMMUNITY): Payer: BC Managed Care – PPO | Admitting: Vascular Surgery

## 2014-05-18 ENCOUNTER — Ambulatory Visit (HOSPITAL_COMMUNITY): Payer: BC Managed Care – PPO | Admitting: Anesthesiology

## 2014-05-18 ENCOUNTER — Ambulatory Visit (HOSPITAL_COMMUNITY)
Admission: RE | Admit: 2014-05-18 | Discharge: 2014-05-18 | Disposition: A | Discharge status: Home / Self Care | Payer: BC Managed Care – PPO | Source: Ambulatory Visit | Attending: General Surgery | Admitting: General Surgery

## 2014-05-18 ENCOUNTER — Encounter (HOSPITAL_COMMUNITY)
Admission: RE | Disposition: A | Discharge status: Home / Self Care | Payer: Self-pay | Source: Ambulatory Visit | Attending: General Surgery

## 2014-05-18 DIAGNOSIS — E079 Disorder of thyroid, unspecified: Secondary | ICD-10-CM | POA: Insufficient documentation

## 2014-05-18 DIAGNOSIS — I498 Other specified cardiac arrhythmias: Secondary | ICD-10-CM | POA: Diagnosis not present

## 2014-05-18 DIAGNOSIS — Z794 Long term (current) use of insulin: Secondary | ICD-10-CM | POA: Insufficient documentation

## 2014-05-18 DIAGNOSIS — K420 Umbilical hernia with obstruction, without gangrene: Secondary | ICD-10-CM | POA: Diagnosis present

## 2014-05-18 DIAGNOSIS — Z7982 Long term (current) use of aspirin: Secondary | ICD-10-CM | POA: Insufficient documentation

## 2014-05-18 DIAGNOSIS — I120 Hypertensive chronic kidney disease with stage 5 chronic kidney disease or end stage renal disease: Secondary | ICD-10-CM | POA: Diagnosis not present

## 2014-05-18 DIAGNOSIS — N186 End stage renal disease: Secondary | ICD-10-CM | POA: Insufficient documentation

## 2014-05-18 DIAGNOSIS — Z79899 Other long term (current) drug therapy: Secondary | ICD-10-CM | POA: Insufficient documentation

## 2014-05-18 DIAGNOSIS — E119 Type 2 diabetes mellitus without complications: Secondary | ICD-10-CM | POA: Insufficient documentation

## 2014-05-18 DIAGNOSIS — E785 Hyperlipidemia, unspecified: Secondary | ICD-10-CM | POA: Insufficient documentation

## 2014-05-18 DIAGNOSIS — I471 Supraventricular tachycardia, unspecified: Secondary | ICD-10-CM | POA: Insufficient documentation

## 2014-05-18 DIAGNOSIS — Z87891 Personal history of nicotine dependence: Secondary | ICD-10-CM | POA: Diagnosis not present

## 2014-05-18 HISTORY — PX: UMBILICAL HERNIA REPAIR: SHX196

## 2014-05-18 HISTORY — PX: CAPD INSERTION: SHX5233

## 2014-05-18 LAB — POCT I-STAT 4, (NA,K, GLUC, HGB,HCT)
GLUCOSE: 113 mg/dL — AB (ref 70–99)
HCT: 25 % — ABNORMAL LOW (ref 39.0–52.0)
Hemoglobin: 8.5 g/dL — ABNORMAL LOW (ref 13.0–17.0)
Potassium: 4.4 mEq/L (ref 3.7–5.3)
Sodium: 141 mEq/L (ref 137–147)

## 2014-05-18 LAB — GLUCOSE, CAPILLARY
GLUCOSE-CAPILLARY: 112 mg/dL — AB (ref 70–99)
Glucose-Capillary: 164 mg/dL — ABNORMAL HIGH (ref 70–99)

## 2014-05-18 SURGERY — LAPAROSCOPIC INSERTION CONTINUOUS AMBULATORY PERITONEAL DIALYSIS  (CAPD) CATHETER
Anesthesia: General | Site: Abdomen

## 2014-05-18 MED ORDER — FENTANYL CITRATE 0.05 MG/ML IJ SOLN
INTRAMUSCULAR | Status: AC
  Filled 2014-05-18: qty 2

## 2014-05-18 MED ORDER — GLYCOPYRROLATE 0.2 MG/ML IJ SOLN
INTRAMUSCULAR | Status: DC | PRN
  Administered 2014-05-18: 0.6 mg via INTRAVENOUS

## 2014-05-18 MED ORDER — DIPHENHYDRAMINE HCL 50 MG/ML IJ SOLN
INTRAMUSCULAR | Status: DC | PRN
  Administered 2014-05-18: 6.25 mg via INTRAVENOUS

## 2014-05-18 MED ORDER — MIDAZOLAM HCL 5 MG/5ML IJ SOLN
INTRAMUSCULAR | Status: DC | PRN
  Administered 2014-05-18: 1 mg via INTRAVENOUS

## 2014-05-18 MED ORDER — PROPOFOL 10 MG/ML IV BOLUS
INTRAVENOUS | Status: AC
  Filled 2014-05-18: qty 20

## 2014-05-18 MED ORDER — BUPIVACAINE HCL 0.25 % IJ SOLN
INTRAMUSCULAR | Status: DC | PRN
  Administered 2014-05-18: 30 mL

## 2014-05-18 MED ORDER — MIDAZOLAM HCL 2 MG/2ML IJ SOLN
INTRAMUSCULAR | Status: AC
  Filled 2014-05-18: qty 2

## 2014-05-18 MED ORDER — PROPOFOL 10 MG/ML IV BOLUS
INTRAVENOUS | Status: DC | PRN
Start: 2014-05-18 — End: 2014-05-18
  Administered 2014-05-18: 50 mg via INTRAVENOUS
  Administered 2014-05-18: 30 mg via INTRAVENOUS
  Administered 2014-05-18: 150 mg via INTRAVENOUS

## 2014-05-18 MED ORDER — MEPERIDINE HCL 25 MG/ML IJ SOLN: 6.2500 mg | INTRAMUSCULAR | Status: DC | PRN

## 2014-05-18 MED ORDER — OXYCODONE-ACETAMINOPHEN 5-325 MG PO TABS: 1.0000 | ORAL_TABLET | ORAL | Status: DC | PRN

## 2014-05-18 MED ORDER — SODIUM CHLORIDE 0.9 % IV SOLN
INTRAVENOUS | Status: DC
Start: 2014-05-18 — End: 2014-05-18
  Administered 2014-05-18: 10:00:00 via INTRAVENOUS

## 2014-05-18 MED ORDER — FENTANYL CITRATE 0.05 MG/ML IJ SOLN
INTRAMUSCULAR | Status: AC
  Filled 2014-05-18: qty 5

## 2014-05-18 MED ORDER — 0.9 % SODIUM CHLORIDE (POUR BTL) OPTIME
TOPICAL | Status: DC | PRN
  Administered 2014-05-18: 1000 mL

## 2014-05-18 MED ORDER — ROCURONIUM BROMIDE 100 MG/10ML IV SOLN
INTRAVENOUS | Status: DC | PRN
  Administered 2014-05-18: 20 mg via INTRAVENOUS
  Administered 2014-05-18: 10 mg via INTRAVENOUS

## 2014-05-18 MED ORDER — SCOPOLAMINE 1 MG/3DAYS TD PT72
1.0000 | MEDICATED_PATCH | TRANSDERMAL | Status: DC
  Administered 2014-05-18: 1.5 mg via TRANSDERMAL
  Filled 2014-05-18: qty 1

## 2014-05-18 MED ORDER — OXYCODONE HCL 5 MG PO TABS
ORAL_TABLET | ORAL | Status: AC
  Filled 2014-05-18: qty 1

## 2014-05-18 MED ORDER — OXYCODONE HCL 5 MG PO TABS
5.0000 mg | ORAL_TABLET | Freq: Once | ORAL | Status: AC
  Administered 2014-05-18: 5 mg via ORAL

## 2014-05-18 MED ORDER — FENTANYL CITRATE 0.05 MG/ML IJ SOLN
INTRAMUSCULAR | Status: DC | PRN
  Administered 2014-05-18: 100 ug via INTRAVENOUS

## 2014-05-18 MED ORDER — SUCCINYLCHOLINE CHLORIDE 20 MG/ML IJ SOLN
INTRAMUSCULAR | Status: DC | PRN
  Administered 2014-05-18: 100 mg via INTRAVENOUS

## 2014-05-18 MED ORDER — ONDANSETRON HCL 4 MG/2ML IJ SOLN
INTRAMUSCULAR | Status: DC | PRN
  Administered 2014-05-18: 4 mg via INTRAVENOUS

## 2014-05-18 MED ORDER — SODIUM CHLORIDE 0.9 % IR SOLN
Status: DC | PRN
  Administered 2014-05-18: 1

## 2014-05-18 MED ORDER — NEOSTIGMINE METHYLSULFATE 10 MG/10ML IV SOLN
INTRAVENOUS | Status: DC | PRN
  Administered 2014-05-18: 4 mg via INTRAVENOUS

## 2014-05-18 MED ORDER — ACETAMINOPHEN 10 MG/ML IV SOLN
1000.0000 mg | INTRAVENOUS | Status: AC
  Administered 2014-05-18: 1000 mg via INTRAVENOUS
  Filled 2014-05-18: qty 100

## 2014-05-18 MED ORDER — PROMETHAZINE HCL 25 MG/ML IJ SOLN: 6.2500 mg | INTRAMUSCULAR | Status: DC | PRN

## 2014-05-18 MED ORDER — LIDOCAINE HCL (CARDIAC) 20 MG/ML IV SOLN
INTRAVENOUS | Status: DC | PRN
  Administered 2014-05-18: 40 mg via INTRAVENOUS

## 2014-05-18 MED ORDER — FENTANYL CITRATE 0.05 MG/ML IJ SOLN
25.0000 ug | INTRAMUSCULAR | Status: DC | PRN
  Administered 2014-05-18: 25 ug via INTRAVENOUS
  Administered 2014-05-18: 50 ug via INTRAVENOUS
  Administered 2014-05-18: 25 ug via INTRAVENOUS

## 2014-05-18 MED ORDER — DEXMEDETOMIDINE HCL 200 MCG/2ML IV SOLN
INTRAVENOUS | Status: DC | PRN
  Administered 2014-05-18 (×3): 10 ug via INTRAVENOUS

## 2014-05-18 SURGICAL SUPPLY — 57 items
ADAPTER TITANIUM MEDIONICS (MISCELLANEOUS) ×5 IMPLANT
ADPR DLYS CATH STRL LF DISP (MISCELLANEOUS) ×2
APL SKNCLS STERI-STRIP NONHPOA (GAUZE/BANDAGES/DRESSINGS) ×1
BENZOIN TINCTURE PRP APPL 2/3 (GAUZE/BANDAGES/DRESSINGS) ×3 IMPLANT
BIOPATCH RED 1 DISK 7.0 (GAUZE/BANDAGES/DRESSINGS) ×1 IMPLANT
BIOPATCH RED 1IN DISK 7.0MM (GAUZE/BANDAGES/DRESSINGS) ×1
BLADE SURG ROTATE 9660 (MISCELLANEOUS) IMPLANT
CANISTER SUCTION 2500CC (MISCELLANEOUS) IMPLANT
CATH EXTENDED DIALYSIS (CATHETERS) ×3 IMPLANT
CHLORAPREP W/TINT 26ML (MISCELLANEOUS) ×3 IMPLANT
CLOSURE WOUND 1/2 X4 (GAUZE/BANDAGES/DRESSINGS) ×1
COVER SURGICAL LIGHT HANDLE (MISCELLANEOUS) ×3 IMPLANT
DECANTER SPIKE VIAL GLASS SM (MISCELLANEOUS) ×2 IMPLANT
DEVICE TROCAR PUNCTURE CLOSURE (ENDOMECHANICALS) ×2 IMPLANT
DISSECTOR BLUNT TIP ENDO 5MM (MISCELLANEOUS) IMPLANT
DRAPE UTILITY 15X26 W/TAPE STR (DRAPE) ×6 IMPLANT
DRSG TEGADERM 2-3/8X2-3/4 SM (GAUZE/BANDAGES/DRESSINGS) ×2 IMPLANT
ELECT REM PT RETURN 9FT ADLT (ELECTROSURGICAL) ×3
ELECTRODE REM PT RTRN 9FT ADLT (ELECTROSURGICAL) ×1 IMPLANT
GAUZE SPONGE 2X2 8PLY STRL LF (GAUZE/BANDAGES/DRESSINGS) ×1 IMPLANT
GLOVE BIO SURGEON STRL SZ7.5 (GLOVE) ×3 IMPLANT
GLOVE BIOGEL M 6.5 STRL (GLOVE) ×2 IMPLANT
GLOVE BIOGEL M 7.0 STRL (GLOVE) ×2 IMPLANT
GLOVE BIOGEL PI IND STRL 7.0 (GLOVE) IMPLANT
GLOVE BIOGEL PI IND STRL 7.5 (GLOVE) IMPLANT
GLOVE BIOGEL PI INDICATOR 7.0 (GLOVE) ×2
GLOVE BIOGEL PI INDICATOR 7.5 (GLOVE) ×2
GOWN STRL REUS W/ TWL LRG LVL3 (GOWN DISPOSABLE) ×2 IMPLANT
GOWN STRL REUS W/ TWL XL LVL3 (GOWN DISPOSABLE) ×1 IMPLANT
GOWN STRL REUS W/TWL LRG LVL3 (GOWN DISPOSABLE) ×6
GOWN STRL REUS W/TWL XL LVL3 (GOWN DISPOSABLE) ×3
KIT BASIN OR (CUSTOM PROCEDURE TRAY) ×3 IMPLANT
KIT ROOM TURNOVER OR (KITS) ×3 IMPLANT
NDL INSUFFLATION 14GA 120MM (NEEDLE) ×1 IMPLANT
NEEDLE INSUFFLATION 14GA 120MM (NEEDLE) ×3 IMPLANT
NS IRRIG 1000ML POUR BTL (IV SOLUTION) ×3 IMPLANT
PAD ARMBOARD 7.5X6 YLW CONV (MISCELLANEOUS) ×6 IMPLANT
SCISSORS LAP 5X35 DISP (ENDOMECHANICALS) ×2 IMPLANT
SET CYSTO W/LG BORE CLAMP LF (SET/KITS/TRAYS/PACK) ×2 IMPLANT
SET EXT 12IN DIALYSIS STAY-SAF (MISCELLANEOUS) ×3 IMPLANT
SET IRRIG TUBING LAPAROSCOPIC (IRRIGATION / IRRIGATOR) IMPLANT
SLEEVE ENDOPATH XCEL 5M (ENDOMECHANICALS) ×4 IMPLANT
SPONGE GAUZE 2X2 STER 10/PKG (GAUZE/BANDAGES/DRESSINGS) ×2
STRIP CLOSURE SKIN 1/2X4 (GAUZE/BANDAGES/DRESSINGS) ×1 IMPLANT
STYLET FALLER (MISCELLANEOUS) ×2 IMPLANT
STYLET FALLER MEDIONICS (MISCELLANEOUS) ×2 IMPLANT
SUT ETHIBOND 0 MO6 C/R (SUTURE) ×2 IMPLANT
SUT MNCRL AB 4-0 PS2 18 (SUTURE) ×5 IMPLANT
SUT PROLENE 2 0 CT2 30 (SUTURE) IMPLANT
SUT SILK 0 FSL (SUTURE) ×4 IMPLANT
SUT SILK 3 0 SH 30 (SUTURE) IMPLANT
TAPE CLOTH SURG 4X10 WHT LF (GAUZE/BANDAGES/DRESSINGS) ×2 IMPLANT
TOWEL OR 17X26 10 PK STRL BLUE (TOWEL DISPOSABLE) ×3 IMPLANT
TRAY LAPAROSCOPIC (CUSTOM PROCEDURE TRAY) ×3 IMPLANT
TROCAR 5MMX150MM (TROCAR) ×3 IMPLANT
TROCAR XCEL NON-BLD 5MMX100MML (ENDOMECHANICALS) ×3 IMPLANT
TUBING CYSTO DISP (UROLOGICAL SUPPLIES) ×3 IMPLANT

## 2014-05-18 NOTE — Anesthesia Procedure Notes (Signed)
Procedure Name: Intubation Date/Time: 05/18/2014 9:56 AM Performed by: Manuela Schwartz B Pre-anesthesia Checklist: Patient identified, Emergency Drugs available, Suction available, Patient being monitored and Timeout performed Patient Re-evaluated:Patient Re-evaluated prior to inductionOxygen Delivery Method: Circle system utilized Preoxygenation: Pre-oxygenation with 100% oxygen Intubation Type: IV induction and Rapid sequence Laryngoscope Size: Mac and 3 Grade View: Grade II Tube type: Oral Tube size: 7.5 mm Number of attempts: 1 Airway Equipment and Method: Stylet Placement Confirmation: ETT inserted through vocal cords under direct vision,  positive ETCO2 and breath sounds checked- equal and bilateral Secured at: 24 cm Tube secured with: Tape Dental Injury: Teeth and Oropharynx as per pre-operative assessment

## 2014-05-18 NOTE — Op Note (Signed)
05/18/2014  11:21 AM  PATIENT:  Robert [name]  53 y.o. male  PRE-OPERATIVE DIAGNOSIS:  chronic Kidney Disease/umbilical hernia  POST-OPERATIVE DIAGNOSIS:  chronic Kidney Disease/umbilical hernia  PROCEDURE:  Procedure(s): LAPAROSCOPIC INSERTION CONTINUOUS AMBULATORY PERITONEAL DIALYSIS  (CAPD) CATHETER (N/A) PRIMARY HERNIA REPAIR UMBILICAL ADULT (N/A)  SURGEON:  Surgeon(s) and Role:    * Ralene Ok, MD - Primary  PHYSICIAN ASSISTANT:   ASSISTANTS: [place]- PA student   ANESTHESIA:   local and general  EBL:  Total I/O In: 400 [I.V.:400] Out: -   BLOOD ADMINISTERED:none  DRAINS: none   LOCAL MEDICATIONS USED:  BUPIVICAINE   SPECIMEN:  No Specimen  DISPOSITION OF SPECIMEN:  N/A  COUNTS:  YES  TOURNIQUET:  * No tourniquets in log *  DICTATION: .Dragon Dictation  Details of the procedure:The patient was taken back to the operating room. The patient was placed in supine position with bilateral SCDs in place. After appropriate anitbiotics were confirmed, a time-out was confirmed and all facts were verified.  The Veress needle technique was used to insufflate the abdomen to 14 mm mercury after a stab incision was made in the right upper quadrant area. Subsequently to this a 5 mm trocar and camera then placed intra-abdominally. There was no injury to any intra-abdominal organs. A left upper quadrant port was placed under direct visualization as was a left lower quadrant trocar. The abdomen was inspected and seen to omental adhesions to the left upper quadrant.    The CAPD catheter was introduced into the right upper quadrant trocar. This was placed into the pelvis. The felt pad was then placed superior to the peritoneum and superior to the posterior fascia. The second felt pad was then placed subcutaneously.  A separate incision was made 3 finger breaths below the right subcostal margin.  A vascular tunneler was used to tunnel from this incision to the previous right  periumbilical incision.  After measuring out the secondary portion with the bend, the upper catheter was cut to size. The catheter was joined to the titanium coupler.  This was all pulled through the tunnel in its entirety.  The catheter tip was from out just lateral to the subcostal incision. At this time the patient was placed in supine position and instilled 800 cc of saline. This instilled easily. 200cc , but was slow to evacuate.  I repositioned the catheter and in all positions the fluid was slow to drain.    The incarcerated peritoneal fat was reduced from the umbilical hernia.  0 Ethibond sutures were used to reapproximate the defect x2.  The insufflation was evacuated under direct visualization. The trochars were removed. All trocar sites were reapproximated using 4-0 Monocryl in subcuticular fashion.  All port sites were dressed with steri-strips, 2x2s and tape.  The patient was taken to the recovery room in stable condition.    PLAN OF CARE: Discharge to home after PACU  PATIENT DISPOSITION:  PACU - hemodynamically stable.   Delay start of Pharmacological VTE agent (>24hrs) due to surgical blood loss or risk of bleeding: not applicable

## 2014-05-18 NOTE — Discharge Instructions (Signed)
PERITONEAL DIALYSIS (CAPD) CATHETER PLACEMENT: ° °POST OPERATIVE INSTRUCTIONS ° °FOLLOW UP with the Peritoneal Dialysis Nurses ° [address] [place], [place] [address] ° °Call [phone] or your neprologist to help arrange training/flushes of your CAPD catheter °  °The CAPD nurses & Nephrology usually follow you closely, making the need for follow-up in the [facility] surgery office redundant and therefore not always needed.  If they or you have concerns, please call us for possible follow-up in our office ° °1. Do NOT shower until dialysis nursing staff have advised.  Do NOT submerge in a bathtub or hot tub. °2. Your Home Therapy RN will advise and educate you on showering, bathing, and swimming when you are in training. °3. The Peritoneal Dialysis nurse will remove your waterproof bandages in the Dialysis Center a few days after surgery.  Do not remove the bandages until seen by them.  If you dressing becomes wet, saturated, or falls off call your Home Therapy RN. °4. ACTIVITIES as tolerated:   °a. You may resume regular (light) daily activities beginning the next day--such as daily self-care, walking, climbing stairs--gradually increasing activities as tolerated.  If you can walk 30 minutes without difficulty, it is safe to try more intense activity such as jogging, treadmill, bicycling, low-impact aerobics,  etc. °b. No swimming within the 1st month of catheter placement.  You must have a Dr's order. °c. Save the most intensive and strenuous activity for last such as sit-ups, heavy lifting, contact sports, etc  Refrain from any heavy lifting or straining until you are off narcotics for pain control.   °d. DO NOT PUSH THROUGH PAIN.  Let pain be your guide: If it hurts to do something, don't do it.  Pain is your body warning you to avoid that activity for another week until the pain goes down. °e. You may drive when you are no longer taking prescription pain medication, you can comfortably wear a seatbelt, and you can  safely maneuver your car and apply brakes. °f. You may have sexual intercourse when it is comfortable.  °g. Be sure your catheter is taped to Your abdomen nor injured. Did not allow catheter to ankle, or tension on the catheter-it may cause damage to skin over the catheter °h. You will be instructed on what to do an emergency, and will have access to on call our in 24 hours a day. The number to reach the home therapy nurse as listed below. °5. DIET: Follow a light bland diet the first 24 hours after arrival home, such as soup, liquids, crackers, etc.  Be sure to include lots of fluids daily.  Avoid fast food or heavy meals as your are more likely to get nauseated.   °6. Take your usually prescribed home medications unless otherwise directed. °7. PAIN CONTROL: °a. Pain is best controlled by a usual combination of three different methods TOGETHER: °i. Ice/Heat °ii. Tylenol (over the counter pain medication) °iii. Prescription pain medication °b. Most patients will experience some swelling and bruising around the incisions.  Ice packs or heating pads (30-60 minutes up to 6 times a day) will help. Use ice for the first few days to help decrease swelling and bruising, then switch to heat to help relax tight/sore spots and speed recovery.  Some people prefer to use ice alone, heat alone, alternating between ice & heat.  Experiment to what works for you.  Swelling and bruising can take several weeks to resolve.   °c. It is helpful to take   an over-the-counter pain medication regularly for the first few weeks.  Using acetaminophen (Tylenol, etc) 500-650mg four times a day (every meal & bedtime) is usually safest since NSAIDs are not advisable in patients with kidney disease. °d. A  prescription for pain medication (such as oxycodone, hydrocodone, etc) should be given to you upon discharge.  Take your pain medication as prescribed.  °i. If you are having problems/concerns with the prescription medicine (does not control pain,  nausea, vomiting, rash, itching, etc), please call us [phone] to see if we need to switch you to a different pain medicine that will work better for you and/or control your side effect better. °ii. If you need a refill on your pain medication, please contact your pharmacy.  They will contact our office to request authorization. Prescriptions will not be filled after 5 pm or on week-ends. °8. Avoid getting constipated.  Between the surgery and the pain medications, it is common to experience some constipation.  Increasing fluid intake and taking a fiber supplement (such as Metamucil, Citrucel, FiberCon, MiraLax, etc) 1-2 times a day regularly will usually help prevent this problem from occurring.  A mild laxative (prune juice, Milk of Magnesia, MiraLax, etc) should be taken according to package directions if there are no bowel movements after 48 hours.  ° ° ° ° ° °FOLLOW UP: °Peritoneal Dialysis nurses ° [address] [place], [place] [address] ° °Call [phone] or your neprologist to help arrange training/flushes of your CAPD catheter °  ° -The CAPD nurses & Nephrology usually follow you closely, making the need for follow-up in our office redundant and therefore not needed.  If they or you have concerns, please call us for possible follow-up in our office ° ° -Please call [facility] at [phone] only as needed. ° °WHEN TO CALL US [phone]: °1. Poor pain control °2. Reactions / problems with new medications (rash/itching, nausea, etc)  °3. Fever over 101.5 F (38.5 C) °4. Worsening swelling or bruising °5. Continued bleeding from incision. °6. Increased pain, redness, or drainage from the incision ° ° The clinic staff is available to answer your questions during regular business hours (8:30am-5pm).  Please don’t hesitate to call and ask to speak to one of our nurses for clinical concerns.  ° If you have a medical emergency, go to the nearest emergency room or call 911. ° A surgeon from [facility] is always on call at the hospitals ° °9. IF YOU HAVE DISABILITY OR FAMILY LEAVE FORMS, BRING THEM TO THE OFFICE FOR PROCESSING.  DO NOT GIVE THEM TO YOUR DOCTOR. ° °[facility] °[address], [address], [place], [place]  [address] ? °MAIN: [phone] ? TOLL FREE: [phone] ?  °FAX [phone] °[email or web address] ° °Peritoneal Dialysis - An Overview °Dialysis can be done using a machine outside of the body (hemodialysis). Or, it can be done inside the body (peritoneal dialysis). The word "peritoneal" refers to the lining or membrane of the belly (abdominal cavity). The peritoneal membrane is a thin, plastic-like lining inside the belly that covers the organs and fits in the abdominal or peritoneal cavity, such as the stomach, liver and the kidneys. This lining works like a filter. It will allow certain things to pass from your blood through the lining and into a special solution that has been placed into your belly. In this type of dialysis, the peritoneum is used to help clean the blood.  °If you need dialysis, your   kidneys are not working right. Healthy kidneys take out extra water and waste products, which becomes urine. When the kidneys do not do this, serious problems can develop. The waste and water build up in the blood. Your hands and feet might swell. You may feel tired, weak or sick to your stomach. Also, your blood pressure may rise. If not treated, you could die. Dialysis is a treatment that does the work that your kidneys would do if they were healthy. °· It cleans your blood.  °· It will make sure your body has the right amount of certain chemicals that it needs. They include potassium, sodium and bicarbonate.  °· It will help control your blood pressure.  °UNDERSTANDING PERITONEAL DIALYSIS °· Here is how peritoneal dialysis works:  °· First, you will have surgery to put a soft plastic tube (catheter) into your belly (abdomen). This will allow you  to easily connect yourself to special tubing, which will then let a special dialysis solution to be placed into your abdomen.  °· For each treatment, you will need at least one bag of dialysis solution (a liquid called dialysate). It is a mix of water that is pure and free of germs (sterile), sugar (dextrose) and the nutrients and minerals found in your blood. Sometimes, more than one bag is needed to get the right amount of fluid for your abdomen. Your caregiver will explain what size and how many bags you will need.  °· The dialysate is slowly put through the catheter to fill the abdomen (called the peritoneal cavity). This dialysate will need to stay in your body for 3-4 hours. This is known as the dwell time.  °· The solution is working to clean the blood and remove wastes from your body. At the end of this time, the solution is drained from your body through tubing into an empty bag. It is then replaced with a fresh dialysate.  °· The draining and replacing of the dialysate is called an exchange or cycle. The catheter is capped after each exchange. Once the solution is in your body, you are then free to do whatever you would like until the next exchange. Most people will need to do 4-5 exchanges each day.  °· There are two different methods that can be used.  °· Continuous ambulatory peritoneal dialysis (CAPD): You put the solution into your abdomen, cap your catheter and then go about your day. Several hours later, you reconnect to a tubing set up, drain out the solution and then put more solution in. This is done several times a day. No machine is needed.  °· Continuous cycler-assisted peritoneal dialysis (CCPD): A machine is used, which fills the abdomen with dialysate and then drains it. This happens several times. It usually is done at night while you are sleeping. When you wake up, you can disconnect from the machine and are free to go to go about your day.  °PREPARING FOR EXCHANGES °· Discuss the details  of the procedure with your caregivers. You will be working with a nurse who is specially trained in doing dialysis. Make sure you understand:  °· How to do an exchange.  °· How much solution you need.  °· What type of solution you will need.  °· How often you should do an exchange. Ask:  °· How many times each day?  °· When? At meals? At bedtime?  °· Always keep the dialysate bags and other supplies in a cool, clean and dry place.  °·   Keeping everything clean is very important.  °· The catheter and its cap must be free from germs (sterile)  °· The adapter also must be sterile. It attaches the dialysis bag and tubing to the catheter.  °· Clean the area of your body around the catheter every day. Use a chemical that fights infection (antiseptic).  °· Wash your hands thoroughly before starting an exchange.  °· You may be taught to wear a mask to cover your nose and mouth. This makes infection less likely to happen.  °· You may be taught to close doors, windows and turn off any fans before doing an exchange.  °· Check the dialysate bag very carefully.  °· Make sure it is the right size bag for you. This information is on the label.  °· Also, make sure it is the right mixture. For some people, the dialysate contents vary. For instance, the mixture might be a stronger solution for overnight.  °· Check the expiration date (the last date you can use the bag). It also is on the label. If the date has gone by, throw away the bag.  °· The solution should be clear. You should be able to see any writing on the side of the bag clearly through the solution. Do not use a cloudy solution.  °· Gently squeeze the bag to make sure there are no leaks.  °· Use a dry heating pad to warm the dialysate in the bag. Leave the cover on the bag while you do this.  °· This is for comfort. You can skip this step if you want.  °· Never place the bag of solution under warm or hot water. Water from a faucet is not sterile and could cause germs to  get into the bag. Infection could then result.  °PERFORMING AN EXCHANGE °· For continuous ambulatory dialysis:  °· Attach the dialysis bag and tubing to your catheter. Hang the bag so that gravity (the natural downward pull) draws the solution down and into your abdomen once the clamps are opened. This should take about 10 minutes.  °· Remove the bag and tubing from the catheter. Cap the catheter.  °· The solution stays in the abdomen for 3-4 hours (dwell time). The solution is working to clean the blood and remove wastes from your body.  °· When you are ready to drain the solution for another exchange, take the cap off the catheter. Then, attach the catheter to tubing, which is attached to an empty bag. Place this empty bag below the abdomen or on the floor or stool and undo the clamps.  °· Gravity helps pull the fluid out of the abdomen and into the bag. The fluid in the bag may look yellow and clear, like urine. It usually takes about 20 minutes to drain the fluid out of the abdomen.  °· When the solution has drained, start the process again by infusing a new bag of dialysate and then capping the catheter.  °· This should continue until you have used all of the solution that you are to use each day.  °· Sometimes, a small machine is used overnight. It is called a mini-cycler. This is done if the body cannot go all night without an exchange. The machine lets you sleep without having to get up and do an exchange.  °· For continuous cycler-assisted dialysis:  °· You will be taught how to set up or program your machine.  °· When you are ready for bed,   put the dialysate bags onto the cycler machine. Put on exactly the number of bags that your caregiver said to use.   Connect your catheter to the machine and turn the cycler machine on.   Overnight, the cycler will do several exchanges. It often does three to five, sometimes more.   Solution that is in your abdomen in the morning will stay during the day. The  machine is set to make the daytime solution stronger, if that is needed.   In the morning, you will disconnect from the machine and cap your catheter and go about your day.   Sometimes, an extra exchange is done during the day. This may be needed to remove excess waste or fluid.  IMPORTANT REMINDERS  You will need to follow a very strict schedule. Every step of the dialysis procedure must be done every day. Sometimes, several times a day. Altogether, this might take an extra 2 hours or more. However, you must stick to the routine. Do not skip a day. Do not skip a procedure.   Some people find it helpful to work with a Social worker or Education officer, museum in addition to the renal (kidney) nurse. They can help you figure out how to change your daily routine to fit in the dialysis sessions.   You may need to change your diet. Ask your caregiver for advice, or talk with a nutritionist about what you should and should not eat.   You will need to weigh yourself every day and keep track of what your weight is.   You may be taught how to check your blood pressure before every exchange. Your blood pressure reading will help determine what type of solution to use. If your blood pressure is too high, you may need a stronger solution.  RISKS AND COMPLICATIONS  Possible problems vary, depending on the method you use. Your overall health also can have an effect. Problems that could develop because of dialysis include:  Infection. This is the most common problem. It could occur:   In the peritoneum. This is called peritonitis.   Around the catheter.   Weight gain. The dialysate contains a type of sugar known as dextrose. Dextrose has a lot of calories. The body takes in several hundred calories from this sugar each day.   Weakened muscles in the abdomen. This can result from all of the fluid that your body has to hold in the abdomen.   Catheter replacement. Sometimes, a new one has to be put in.   Change in  dialysis method. Due to some complications, you may need to change to hemodialysis for a short time and have your dialysis done at a center.   Trouble adjusting to your new lifestyle. In some people, this leads to depression.   Sleep problems.   Dialysis-related amyloidosis. This sometimes occurs after 5 years of dialysis. Protein builds up in the blood. This can cause painful deposits on bones, joints and tendons (which connect muscle to bone). Or, it can cause hollow spots in bones that make them more likely to break.   Excess fluid. Your body may absorb too much of the fluid that is held in the abdomen. This can lead to heart or lung problems.  SEEK MEDICAL CARE IF:   You have any problems with an exchange.   The area around the catheter becomes red or painful.   The catheter seems loose, or it feels like it is coming out.   A bag of dialysate looks  cloudy. Or, the liquid is an unusual color.   Abdominal pain or discomfort.   You feel sick to your stomach (nauseous) or throw up (vomit).   You develop a fever of more than 102 F (38.9 C).  SEEK IMMEDIATE MEDICAL CARE IF:  You develop a fever of more than 102 F (38.9 C). Document Released: 07/22/2009 Document Revised: 09/13/2011 Document Reviewed: 07/22/2009 [facility] Patient Information 2012 [place].  Diet for Peritoneal Dialysis This diet may be modified in protein, sodium, phosphorus, potassium, or fluid, depending on your needs. The goals of nutrition therapy are similar to those for patients on hemodialysis. Providing enough protein to replace peritoneal losses is a priority. USES OF THIS DIET The diet is designed for the patient with end-stage kidney (renal) disease, who is treated by peritoneal dialysis. Treatment options include:  Continuous Ambulatory Peritoneal Dialysis (CAPD): Usually 4 exchanges of 1.5 to 2 liter volumes of glucose (sugar) and electrolyte-containing dialysate.   Continuous Cyclic Peritoneal  Dialysis (CCPD): Essentially a reversal of CAPD, with shorter exchanges at night and a longer one during the day.   Intermittent Peritoneal Dialysis (IPD): 10 to 12 hours of exchanges, 2 to 3 times weekly.  ADEQUACY The diet may not meet the Recommended Dietary Allowances of the [employer] for calcium and ascorbic acid. Protein and water-soluble vitamin needs may be increased because of losses into the dialysate. Recommended daily supplements are the same as for hemodialysis patients. ASSESSMENT/DETERMINATION OF DIET Dietary needs will differ between patients. Parameters must be individualized. Protein  Guidelines: 1.2 to 1.3 gm/kg/day OR 1.5 gm/kg/day if patient is malnourished, catabolic, or has a protracted episode of peritonitis. A minimum of 50% of the protein intake should be of high biological value.   Goals: Meet protein requirements and replace dialysate losses while avoiding excessive accumulation of waste products. Achieve serum albumin greater than 3.5 g/dL.   Evaluate: Current nutritional status, serum albumin and BUN levels, presence of peritonitis.  Sodium  Guidelines: Usually 90 to 175 mEq (2000 to 4000 mg), but should be individualized.   Goals: Minimize complications of fluid imbalance.   Evaluate: Weight, blood pressure regulation, and presence of swelling (edema).  Potassium  Guidelines: Individualized; often not restricted, and may need to be supplemented.   Goals: Serum K+ levels between 4.0 to 5.0 mEq/L.   Evaluate: Serum K+ levels, usual intake of K+, appetite.  Phosphorus  Guidelines: 800 to 1200 mg/day (the high protein intake results in a high obligatory P intake).   Goal: Serum P levels between 4.5 to 6.0 mg/dL.   Evaluate: Serum P levels, usual P intake, P-binding medications: type, number, dosage, distribution.  Fluids  Guidelines: Individualized - may not be restricted for all patients.   Goal: Minimize complications of fluid  imbalance.   Evaluate: Weight, blood pressure regulation, sodium intake, and presence of edema.  Document Released: 09/24/2005 Document Revised: 09/13/2011 Document Reviewed: 12/17/2006 [facility] Patient Information 2012 [place].   What to eat:  For your first meals, you should eat lightly; only small meals initially.  If you do not have nausea, you may eat larger meals.  Avoid spicy, greasy and heavy food.    General Anesthesia, Adult, Care After  Refer to this sheet in the next few weeks. These instructions provide you with information on caring for yourself after your procedure. Your health care provider may also give you more specific instructions. Your treatment has been planned according to current medical practices, but problems sometimes occur. Call  your health care provider if you have any problems or questions after your procedure.  WHAT TO EXPECT AFTER THE PROCEDURE  After the procedure, it is typical to experience:  Sleepiness.  Nausea and vomiting. HOME CARE INSTRUCTIONS  For the first 24 hours after general anesthesia:  Have a responsible person with you.  Do not drive a car. If you are alone, do not take public transportation.  Do not drink alcohol.  Do not take medicine that has not been prescribed by your health care provider.  Do not sign important papers or make important decisions.  You may resume a normal diet and activities as directed by your health care provider.  Change bandages (dressings) as directed.  If you have questions or problems that seem related to general anesthesia, call the hospital and ask for the anesthetist or anesthesiologist on call. SEEK MEDICAL CARE IF:  You have nausea and vomiting that continue the day after anesthesia.  You develop a rash. SEEK IMMEDIATE MEDICAL CARE IF:  You have difficulty breathing.  You have chest pain.  You have any allergic problems. Document Released: 12/31/2000 Document Revised: 05/27/2013 Document  Reviewed: 04/09/2013  [facility] Patient Information 2014 [place], [place].

## 2014-05-18 NOTE — Anesthesia Preprocedure Evaluation (Addendum)
Anesthesia Evaluation  Patient identified by MRN, date of birth, ID band Patient awake    Reviewed: Allergy & Precautions, H&P , NPO status , Patient's Chart, lab work & pertinent test results, reviewed documented beta blocker date and time   Airway Mallampati: II TM Distance: >3 FB Neck ROM: Full    Dental  (+) Teeth Intact, Dental Advisory Given   Pulmonary former smoker,  breath sounds clear to auscultation        Cardiovascular hypertension, Pt. on medications and Pt. on home beta blockers + DOE (can do one flight of stairs) + dysrhythmias (Hx of SVT  none resently.  On Beta blocker) Rate:Normal     Neuro/Psych    GI/Hepatic GERD-  ,  Endo/Other  diabetes  Renal/GU CRFRenal disease     Musculoskeletal   Abdominal (+) + obese,   Peds  Hematology   Anesthesia Other Findings Peripheral adema.  No worse than usual.  No acute CH.  Last ECHO EF 50% 2015  Reproductive/Obstetrics                        Anesthesia Physical Anesthesia Plan  ASA: IV  Anesthesia Plan: General   Post-op Pain Management:    Induction: Intravenous, Rapid sequence and Cricoid pressure planned  Airway Management Planned: Oral ETT  Additional Equipment:   Intra-op Plan:   Post-operative Plan:   Informed Consent: I have reviewed the patients History and Physical, chart, labs and discussed the procedure including the risks, benefits and alternatives for the proposed anesthesia with the patient or authorized representative who has indicated his/her understanding and acceptance.     Plan Discussed with:   Anesthesia Plan Comments:         Anesthesia Quick Evaluation

## 2014-05-18 NOTE — Transfer of Care (Signed)
Immediate Anesthesia Transfer of Care Note  Patient: Robert [name]  Procedure(s) Performed: Procedure(s): LAPAROSCOPIC INSERTION CONTINUOUS AMBULATORY PERITONEAL DIALYSIS  (CAPD) CATHETER (N/A) HERNIA REPAIR UMBILICAL ADULT (N/A)  Patient Location: PACU  Anesthesia Type:General  Level of Consciousness: awake, patient cooperative and responds to stimulation  Airway & Oxygen Therapy: Patient Spontanous Breathing and Patient connected to face mask oxygen  Post-op Assessment: Report given to PACU RN, Post -op Vital signs reviewed and stable and Patient moving all extremities X 4  Post vital signs: Reviewed and stable  Complications: No apparent anesthesia complications

## 2014-05-18 NOTE — Progress Notes (Signed)
Lunch relief by G. Ignacia Palma RN

## 2014-05-18 NOTE — H&P (Signed)
HPI  Robert [name] is a 53 y.o. male. The patient is a 53 year old male with a history of chronic kidney disease he sees Dr. Clover Mealy. The patient recently saw Dr. Trula Slade for evaluation hemodialysis. 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  The patient does have a history of left nephrectomy, splenorrhaphy, appendectomy, and questionable intestinal injury at time of splenorrhaphy in 1974. The patient is unsure of what that consisted of.  HPI  Past Medical History   Diagnosis  Date   .  Diabetes mellitus without complication    .  Chronic kidney disease    .  Hypertension    .  Thyroid disease    .  Sleep apnea  06/18/11     split-night sleep study- [facility].   .  Hyperlipidemia    .  PAT (paroxysmal atrial tachycardia)    .  PSVT (paroxysmal supraventricular tachycardia)     Past Surgical History   Procedure  Laterality  Date   .  Nephrectomy  Left  1974   .  Renal biopsy  Right  2012   No family history on file.  Social History  History   Substance Use Topics   .  Smoking status:  Former Smoker     Types:  Pipe     Quit date:  12/06/2013   .  Smokeless tobacco:  Never Used   .  Alcohol Use:  Yes      Comment: 1 glass of wine every other day.   No Known Allergies  Current Outpatient Prescriptions   Medication  Sig  Dispense  Refill   .  aspirin 81 MG tablet  Take 81 mg by mouth every other day.     Marland Kitchen  BYSTOLIC 10 MG tablet  Take 15 mg by mouth daily.     .  Cholecalciferol (VITAMIN D) 2000 UNITS tablet  Take 2,000 Units by mouth daily.     Marland Kitchen  desoximetasone (TOPICORT) 0.25 % cream  Apply 1 application topically daily.     Marland Kitchen  docusate sodium (COLACE) 100 MG capsule  Take 100 mg by mouth 2 (two) times daily.     Marland Kitchen  ezetimibe (ZETIA) 10 MG tablet  Take 10 mg by mouth daily.     .  fluocinonide cream (LIDEX) [id number] %  Apply  1 application topically daily.     .  furosemide (LASIX) 80 MG tablet  Take 80 mg by mouth 2 (two) times daily. Takes 120 mg     .  hydrALAZINE (APRESOLINE) 50 MG tablet  Take 75 mg by mouth 2 (two) times daily.     .  Insulin Glargine (LANTUS SOLOSTAR) 100 UNIT/ML Solostar Pen  Inject 26 Units into the skin daily at 10 pm.  15 pen  1   .  Insulin Pen Needle 32G X 4 MM MISC  Use 4 pen needles per day to inject insulin  250 each  1   .  insulin regular human CONCENTRATED (HUMULIN R) 500 UNIT/ML SOLN injection  8/5/8 units daily.  60 mL  1   .  levothyroxine (SYNTHROID, LEVOTHROID) 100 MCG tablet  Take 1 tablet (100 mcg total) by mouth daily.  90 tablet  1   .  Liraglutide (VICTOZA) 18 MG/3ML SOPN  Inject 1.2 mg into the skin daily.  6 pen  1   .  NON FORMULARY  CPAP therapy.     .  rosuvastatin (CRESTOR) 20 MG tablet  Take 1 tablet (20 mg total) by mouth daily.  90 tablet  1   .  senna (SENOKOT) 8.6 MG tablet  Take 1 tablet by mouth daily.     .  sildenafil (VIAGRA) 100 MG tablet  Take 100 mg by mouth as needed for erectile dysfunction.     .  verapamil (CALAN-SR) 240 MG CR tablet  Take 240 mg by mouth daily. Takes 1/2 tablet daily     .  vitamin E 400 UNIT capsule  Take 400 Units by mouth daily.      No current facility-administered medications for this visit.   Review of Systems  Review of Systems  Constitutional: Negative.  HENT: Negative.  Eyes: Negative.  Respiratory: Negative.  Cardiovascular: Negative.  Gastrointestinal: Negative.  Endocrine: Negative.  Neurological: Negative.  Blood pressure 152/94, pulse 71, temperature 97.4 F (36.3 C), temperature source Temporal, resp. rate 18, height 6' (1.829 m), weight 320 lb (145.151 kg).  Physical Exam  Physical Exam  Constitutional: He is oriented to person, place, and time. He appears well-developed and well-nourished.  HENT:  Head: Normocephalic and atraumatic.  Eyes: Conjunctivae and EOM are normal. Pupils are equal, round, and  reactive to light.  Neck: Normal range of motion. Neck supple.  Cardiovascular: Normal rate, regular rhythm and normal heart sounds.  Pulmonary/Chest: Effort normal and breath sounds normal.  Abdominal: A hernia is present. Hernia confirmed positive in the ventral area.    Musculoskeletal: Normal range of motion.  Neurological: He is alert and oriented to person, place, and time.  Skin: Skin is warm and dry.  Data Reviewed  none  Assessment  53 year old male with chronic kidney disease. Patient here for evaluation of the peritoneal dialysis placement. Pt also with a reducible umbilical hernia  Plan  1 we will proceed to the operating room for laparoscopic peritoneal Dialysis catheter placement.  2. Discussed with patient the risks and benefits of the procedure to include but not limited to: Infection, bleeding, ensuring structures, malfunction of the peritoneal dialysis catheter, need for possible reoperation to troubleshoot the peritoneal dialysis catheter, and possible need for removal. The patient voiced understanding and wished to proceed.

## 2014-05-18 NOTE — Anesthesia Postprocedure Evaluation (Signed)
  Anesthesia Post-op Note  Patient: Robert [name]  Procedure(s) Performed: Procedure(s): LAPAROSCOPIC INSERTION CONTINUOUS AMBULATORY PERITONEAL DIALYSIS  (CAPD) CATHETER (N/A) HERNIA REPAIR UMBILICAL ADULT (N/A)  Patient Location: PACU  Anesthesia Type:General  Level of Consciousness: awake and alert   Airway and Oxygen Therapy: Patient Spontanous Breathing and Patient connected to nasal cannula oxygen  Post-op Pain: mild  Post-op Assessment: Post-op Vital signs reviewed, Patient's Cardiovascular Status Stable and Respiratory Function Stable  Post-op Vital Signs: Reviewed and stable  Last Vitals:  Filed Vitals:   05/18/14 1223  BP:   Pulse: 64  Temp:   Resp: 22    Complications: No apparent anesthesia complications

## 2014-05-20 ENCOUNTER — Encounter (HOSPITAL_COMMUNITY): Payer: Self-pay | Admitting: General Surgery

## 2014-05-20 ENCOUNTER — Telehealth (INDEPENDENT_AMBULATORY_CARE_PROVIDER_SITE_OTHER): Payer: Self-pay | Admitting: *Deleted

## 2014-05-20 NOTE — Telephone Encounter (Signed)
Pt called.  He is s/p LAPAROSCOPIC INSERTION CONTINUOUS AMBULATORY PERITONEAL DIALYSIS (CAPD) CATHETER (N/A)  PRIMARY HERNIA REPAIR UMBILICAL ADULT (N/A) [id number] and was given a rx for Oxycodone 5-325 1-2 q 4 hours.  Pt states he will run out before the weekend and needs a new rx.   Please verify if ok or not.  Thanks!  Anderson Malta

## 2014-05-21 ENCOUNTER — Other Ambulatory Visit (INDEPENDENT_AMBULATORY_CARE_PROVIDER_SITE_OTHER): Payer: Self-pay | Admitting: *Deleted

## 2014-05-21 ENCOUNTER — Telehealth (INDEPENDENT_AMBULATORY_CARE_PROVIDER_SITE_OTHER): Payer: Self-pay

## 2014-05-21 MED ORDER — OXYCODONE-ACETAMINOPHEN 5-325 MG PO TABS: 1.0000 | ORAL_TABLET | ORAL | Status: DC | PRN

## 2014-05-21 NOTE — Telephone Encounter (Signed)
V/M Rx ready for P/U before 5pm @ [facility]

## 2014-05-21 NOTE — Telephone Encounter (Signed)
OK to refill per protocol

## 2014-05-27 ENCOUNTER — Ambulatory Visit (INDEPENDENT_AMBULATORY_CARE_PROVIDER_SITE_OTHER): Payer: BC Managed Care – PPO | Admitting: General Surgery

## 2014-05-27 ENCOUNTER — Encounter (INDEPENDENT_AMBULATORY_CARE_PROVIDER_SITE_OTHER): Payer: Self-pay | Admitting: General Surgery

## 2014-05-27 VITALS — BP 144/86 | HR 80 | Temp 97.0°F | Resp 18 | Wt 327.6 lb

## 2014-05-27 DIAGNOSIS — Z9889 Other specified postprocedural states: Secondary | ICD-10-CM

## 2014-05-27 NOTE — Progress Notes (Signed)
Patient ID: Robert [name], male   DOB: [birth year], 53 y.o.   MRN: [id number] [name] course The patient is a 53 year old male status post laparoscopic the catheter placement. Patient has been doing well postoperatively. He did have it is flushed by the dialysis center and has been treated appropriately.  On Exam: Wounds are clean dry and intact  Assessment and Plan 53 year old male status post laparoscopic CAPD placement 1. Patient follow up as needed 2. Patient had a continuous peritoneal dialysis as scheduled per the dialysis center.    Ralene Ok, MD [facility] General & Minimally Invasive Surgery Trauma & Emergency Surgery

## 2014-06-01 ENCOUNTER — Telehealth (INDEPENDENT_AMBULATORY_CARE_PROVIDER_SITE_OTHER): Payer: Self-pay

## 2014-06-01 NOTE — Telephone Encounter (Signed)
Pt called back. I let him know letter was faxed.

## 2014-06-01 NOTE — Telephone Encounter (Signed)
Pt requesting office note be faxed to [facility] stating he is released for peritoneal dialysis.  Note faxed to [facility] at [phone] and confirmation received.

## 2014-06-09 ENCOUNTER — Encounter: Payer: Self-pay | Admitting: Endocrinology

## 2014-06-09 ENCOUNTER — Ambulatory Visit (INDEPENDENT_AMBULATORY_CARE_PROVIDER_SITE_OTHER): Payer: BC Managed Care – PPO | Admitting: Endocrinology

## 2014-06-09 VITALS — BP 136/82 | HR 77 | Temp 98.0°F | Resp 16 | Ht 72.0 in | Wt 310.2 lb

## 2014-06-09 DIAGNOSIS — E039 Hypothyroidism, unspecified: Secondary | ICD-10-CM

## 2014-06-09 DIAGNOSIS — E1165 Type 2 diabetes mellitus with hyperglycemia: Principal | ICD-10-CM

## 2014-06-09 DIAGNOSIS — E1129 Type 2 diabetes mellitus with other diabetic kidney complication: Secondary | ICD-10-CM

## 2014-06-09 NOTE — Progress Notes (Signed)
Patient ID: Robert [name], male   DOB: 05-17-1961, 53 y.o.   MRN: [id number]   Reason for Appointment: Type II Diabetes follow-up   History of Present Illness   Diagnosis date:  2012  Previous history: His blood sugar has been difficult to control since onset. Initially was taking oral hypoglycemic drugs like glyburide but could not take metformin because of renal dysfunction. Since Victoza did not help his control he was started on Lantus and then U 500 insulin also His A1c has previously ranged from 7.8-9.8, the lowest level in 03/2013  Recent history: He has continued his regimen of basal insulin once a day in the morning and U-500 insulin before meals He continues to take VICTOZA 1.2 mg However since his last visit his blood sugars are much lower probably related to his worsening renal dysfunction Current problems:  Blood sugars are normal and occasionally low but only one high reading of 166 at home  He has been able to reduce his U.-500 insulin down to 5 units when the blood sugar is low normal and is not using it when he is not eating  Significantly decreased appetite and weight loss with worsening renal function  Not able to exercise much because of tendency to dyspnea on exertion  He is concerned about cost of various medications including Victoza     Side effects from medications: None Insulin regimen: LANTUS 26 units In a.m. now. U-500 insulin: 5-5-8. 30 min ac;        Proper timing of medications in relation to meals: Yes.          Monitors blood glucose: Once a day or less.    Glucometer: One Touch.          Blood Glucose readings from meter download:   PREMEAL Breakfast Lunch Dinner  8 PM  Overall  Glucose range:  89-100   88-134   57-166   73    Mean/median:  98     97    Hypoglycemia:  once or twice a week, documented only once at 4:30 PM         Meals: 3 meals per day. Dinner 7 p,m. Currently on the low phosphorus diet and has fewer carbohydrates         Physical activity: exercise: none           Weight control:  Wt Readings from Last 3 Encounters:  06/09/14 310 lb 3.2 oz (140.706 kg)  05/27/14 327 lb 9.6 oz (148.598 kg)  05/18/14 329 lb (149.233 kg)          Complications:   nephropathy, neuropathy   Diabetes labs:  Lab Results  Component Value Date   HGBA1C 7.0* 03/31/2014   HGBA1C 9.4* 01/04/2014   Lab Results  Component Value Date   MICROALBUR 224.5 Repeated and verified X2.* 01/04/2014   LDLCALC 20 03/31/2014   CREATININE 10.55* 04/07/2014       Medication List       This list is accurate as of: 06/09/14  8:58 AM.  Always use your most recent med list.               aspirin 81 MG tablet  Take 81 mg by mouth every other day.     BYSTOLIC 10 MG tablet  Generic drug:  nebivolol  Take 15 mg by mouth daily.     cetirizine 10 MG tablet  Commonly known as:  ZYRTEC  Take 10 mg by mouth daily.  desoximetasone 0.25 % cream  Commonly known as:  TOPICORT  Apply 1 application topically daily.     docusate sodium 100 MG capsule  Commonly known as:  COLACE  Take 100 mg by mouth daily.     ezetimibe 10 MG tablet  Commonly known as:  ZETIA  Take 10 mg by mouth daily.     fluocinonide cream 0.05 %  Commonly known as:  LIDEX  Apply 1 application topically daily.     furosemide 80 MG tablet  Commonly known as:  LASIX  Take 160 mg by mouth 2 (two) times daily.     gentamicin cream 0.1 %  Commonly known as:  GARAMYCIN     hydrALAZINE 50 MG tablet  Commonly known as:  APRESOLINE  Take 75 mg by mouth 2 (two) times daily.     Insulin Glargine 100 UNIT/ML Solostar Pen  Commonly known as:  LANTUS SOLOSTAR  Inject 26 Units into the skin daily at 10 pm.     Insulin Pen Needle 32G X 4 MM Misc  Use 4 pen needles per day to inject insulin     insulin regular human CONCENTRATED 500 UNIT/ML Soln injection  Commonly known as:  HUMULIN R  8/5/8 units daily.     lactulose (encephalopathy) 10 GM/15ML Soln  Commonly  known as:  CHRONULAC     levothyroxine 100 MCG tablet  Commonly known as:  SYNTHROID, LEVOTHROID  Take 1 tablet (100 mcg total) by mouth daily.     Liraglutide 18 MG/3ML Sopn  Commonly known as:  VICTOZA  Inject 1.2 mg into the skin daily.     NON FORMULARY  every evening. CPAP therapy.     oxyCODONE-acetaminophen 5-325 MG per tablet  Commonly known as:  ROXICET  Take 1-2 tablets by mouth every 4 (four) hours as needed.     rosuvastatin 20 MG tablet  Commonly known as:  CRESTOR  Take 1 tablet (20 mg total) by mouth daily.     senna 8.6 MG tablet  Commonly known as:  SENOKOT  Take 1 tablet by mouth daily.     sildenafil 100 MG tablet  Commonly known as:  VIAGRA  Take 50 mg by mouth as needed for erectile dysfunction.     Vitamin D 2000 UNITS tablet  Take 4,000 Units by mouth daily.     vitamin E 400 UNIT capsule  Take 400 Units by mouth daily.        Allergies: No Known Allergies  Past Medical History  Diagnosis Date  . Diabetes mellitus without complication   . Hypertension   . Thyroid disease   . Sleep apnea 06/18/11    split-night sleep study- [facility].  . Hyperlipidemia   . PAT (paroxysmal atrial tachycardia)   . PSVT (paroxysmal supraventricular tachycardia)   . Blood transfusion without reported diagnosis   . Chronic kidney disease     Stage IV  . CPAP (continuous positive airway pressure) dependence   . Dysrhythmia   . Shortness of breath   . Hypothyroidism   . GERD (gastroesophageal reflux disease)     n/v a lot  . Headache(784.0)     slight dull h/a due kidney failure  . Cancer     skin ca right shoulder  . Anemia     low hgb at present  . Umbilical hernia     will repair with thi surgery    Past Surgical History  Procedure Laterality Date  .  Nephrectomy Left 1974  . Renal biopsy Right 2012  . Skin cancer excision    . Appendectomy  1974  . Capd insertion N/A 05/18/2014    Procedure: LAPAROSCOPIC INSERTION  CONTINUOUS AMBULATORY PERITONEAL DIALYSIS  (CAPD) CATHETER;  Surgeon: Ralene Ok, MD;  Location: [facility];  Service: General;  Laterality: N/A;  . Umbilical hernia repair N/A 05/18/2014    Procedure: HERNIA REPAIR UMBILICAL ADULT;  Surgeon: Ralene Ok, MD;  [facility];  Service: General;  Laterality: N/A;    Family History  Problem Relation Age of Onset  . Colon polyps Father   . Colon cancer Neg Hx     Social History:  reports that he quit smoking about 6 months ago. His smoking use included Pipe. He has never used smokeless tobacco. He reports that he does not drink alcohol or use illicit drugs.  Review of Systems:  Hypertension:  BP at home 130/80-90 in the mornings, later in the day 150/100-110. His blood pressure has been managed by his cardiologist mostly  Lipids: He has been unable to get consistent control. Was switched from Lipitor to Crestor because of relatively high LDL levels. Also subsequently Zetia was added to get LDL to target.    Lab Results  Component Value Date   CHOL 97 03/31/2014   HDL 32.60* 03/31/2014   LDLCALC 20 03/31/2014   TRIG 221.0* 03/31/2014   CHOLHDL 3 03/31/2014    He tends to get short of breath on exertion.  Thyroid: He has had long-standing hypothyroidism   TSH had been previously consistent. Does complain of feeling tired  but has multiple other problems including severe anemia  Lab Results  Component Value Date   TSH 2.75 01/04/2014       HYPOGONADISM: 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 metabolic syndrome and had subjectively improved with supplementation using Fortesta He is off testosterone and free testosterone was still normal in 3/15   Diabetic neuropathy: Has mild sensory loss   Examination:   BP 136/82  Pulse 77  Temp(Src) 98 F (36.7 C)  Resp 16  Ht 6' (1.829 m)  Wt 310 lb 3.2 oz (140.706 kg)  BMI 42.06 kg/m2  SpO2 95%  Body mass index is 42.06 kg/(m^2).    ASSESSMENT/ PLAN:     Diabetes type 2:  Previously has had difficulty controlling his glucose adequately even with multiple insulin doses adding up to over 100 units a day His blood sugars appear to be significantly lower with worsening renal function He has cut down on his mealtime insulin since his last visit but is still having occasional hypoglycemia This is partly related to his decreased appetite as discussed in history of present illness Other problems are outlined above A1c was 7% previously and will need to be rechecked on his next visit  Currently he is about to start peritoneal dialysis and this may make his insulin requirement significantly higher  Discussed potential for hypoglycemia especially overnight with using higher concentration dialysate bag  Also discussed in detail how to adjust his insulin now and when he starts dialysis  Encouraged him to check his sugar much more often especially when starting dialysis  Also needs to start exercise when he is able to He is concerned about the cost of medications and may consider switching to Toujeo, also would consider not using Victoza if sugars continue to stay down  Recommendations made today are outlined in his instructions  Patient Instructions  Before Dialysis reduce lantus  to 22 and keep mealtime insulin 4-5 units   With dialysis use at least 26 Lantus and go up to 30-32 if am sugar  After dialysis may need as much as 12 of U-500 insulin at dinner  Watch sugars closely  Stop Victoza till sugars > 180    Hypothyroidism: Need followup TSH on the next visit  Counseling time over 50% of today's 25 minute visit  Toluwani Ruder 06/09/2014, 8:58 AM

## 2014-06-09 NOTE — Patient Instructions (Addendum)
Before Dialysis reduce lantus to 22 and keep mealtime insulin 4-5 units   With dialysis use at least 26 Lantus and go up to 30-32 if am sugar  After dialysis may need as much as 12 of U-500 insulin at dinner  Watch sugars closely  Stop Victoza till sugars > 180

## 2014-06-12 ENCOUNTER — Inpatient Hospital Stay (HOSPITAL_COMMUNITY)
Admission: EM | Admit: 2014-06-12 | Discharge: 2014-06-21 | DRG: 682 | Disposition: A | Discharge status: Home / Self Care | Payer: BC Managed Care – PPO | Attending: Nephrology | Admitting: Nephrology

## 2014-06-12 ENCOUNTER — Encounter (HOSPITAL_COMMUNITY): Payer: Self-pay | Admitting: Emergency Medicine

## 2014-06-12 ENCOUNTER — Emergency Department (HOSPITAL_COMMUNITY): Payer: BC Managed Care – PPO

## 2014-06-12 DIAGNOSIS — E872 Acidosis, unspecified: Secondary | ICD-10-CM | POA: Diagnosis present

## 2014-06-12 DIAGNOSIS — Y92009 Unspecified place in unspecified non-institutional (private) residence as the place of occurrence of the external cause: Secondary | ICD-10-CM

## 2014-06-12 DIAGNOSIS — G4733 Obstructive sleep apnea (adult) (pediatric): Secondary | ICD-10-CM | POA: Diagnosis present

## 2014-06-12 DIAGNOSIS — Z85828 Personal history of other malignant neoplasm of skin: Secondary | ICD-10-CM

## 2014-06-12 DIAGNOSIS — Z6841 Body Mass Index (BMI) 40.0 and over, adult: Secondary | ICD-10-CM

## 2014-06-12 DIAGNOSIS — T85691A Other mechanical complication of intraperitoneal dialysis catheter, initial encounter: Secondary | ICD-10-CM | POA: Diagnosis present

## 2014-06-12 DIAGNOSIS — N058 Unspecified nephritic syndrome with other morphologic changes: Secondary | ICD-10-CM | POA: Diagnosis present

## 2014-06-12 DIAGNOSIS — E039 Hypothyroidism, unspecified: Secondary | ICD-10-CM | POA: Diagnosis present

## 2014-06-12 DIAGNOSIS — D649 Anemia, unspecified: Secondary | ICD-10-CM | POA: Diagnosis present

## 2014-06-12 DIAGNOSIS — Z87891 Personal history of nicotine dependence: Secondary | ICD-10-CM

## 2014-06-12 DIAGNOSIS — M25519 Pain in unspecified shoulder: Secondary | ICD-10-CM | POA: Diagnosis present

## 2014-06-12 DIAGNOSIS — Z794 Long term (current) use of insulin: Secondary | ICD-10-CM | POA: Diagnosis not present

## 2014-06-12 DIAGNOSIS — Z992 Dependence on renal dialysis: Secondary | ICD-10-CM | POA: Diagnosis not present

## 2014-06-12 DIAGNOSIS — K219 Gastro-esophageal reflux disease without esophagitis: Secondary | ICD-10-CM | POA: Diagnosis present

## 2014-06-12 DIAGNOSIS — K659 Peritonitis, unspecified: Secondary | ICD-10-CM | POA: Diagnosis not present

## 2014-06-12 DIAGNOSIS — E669 Obesity, unspecified: Secondary | ICD-10-CM

## 2014-06-12 DIAGNOSIS — N186 End stage renal disease: Secondary | ICD-10-CM | POA: Diagnosis present

## 2014-06-12 DIAGNOSIS — I498 Other specified cardiac arrhythmias: Secondary | ICD-10-CM | POA: Diagnosis not present

## 2014-06-12 DIAGNOSIS — I471 Supraventricular tachycardia: Secondary | ICD-10-CM | POA: Diagnosis present

## 2014-06-12 DIAGNOSIS — N19 Unspecified kidney failure: Secondary | ICD-10-CM | POA: Diagnosis present

## 2014-06-12 DIAGNOSIS — Z79899 Other long term (current) drug therapy: Secondary | ICD-10-CM | POA: Diagnosis not present

## 2014-06-12 DIAGNOSIS — E43 Unspecified severe protein-calorie malnutrition: Secondary | ICD-10-CM | POA: Diagnosis present

## 2014-06-12 DIAGNOSIS — R1115 Cyclical vomiting syndrome unrelated to migraine: Secondary | ICD-10-CM | POA: Diagnosis present

## 2014-06-12 DIAGNOSIS — R339 Retention of urine, unspecified: Secondary | ICD-10-CM | POA: Diagnosis present

## 2014-06-12 DIAGNOSIS — R112 Nausea with vomiting, unspecified: Secondary | ICD-10-CM

## 2014-06-12 DIAGNOSIS — I120 Hypertensive chronic kidney disease with stage 5 chronic kidney disease or end stage renal disease: Principal | ICD-10-CM | POA: Diagnosis present

## 2014-06-12 DIAGNOSIS — Z23 Encounter for immunization: Secondary | ICD-10-CM

## 2014-06-12 DIAGNOSIS — E785 Hyperlipidemia, unspecified: Secondary | ICD-10-CM | POA: Diagnosis present

## 2014-06-12 DIAGNOSIS — E1129 Type 2 diabetes mellitus with other diabetic kidney complication: Secondary | ICD-10-CM | POA: Diagnosis present

## 2014-06-12 DIAGNOSIS — Y841 Kidney dialysis as the cause of abnormal reaction of the patient, or of later complication, without mention of misadventure at the time of the procedure: Secondary | ICD-10-CM | POA: Diagnosis not present

## 2014-06-12 DIAGNOSIS — E1169 Type 2 diabetes mellitus with other specified complication: Secondary | ICD-10-CM | POA: Diagnosis present

## 2014-06-12 DIAGNOSIS — N1832 Chronic kidney disease, stage 3b: Secondary | ICD-10-CM | POA: Diagnosis present

## 2014-06-12 DIAGNOSIS — E1165 Type 2 diabetes mellitus with hyperglycemia: Secondary | ICD-10-CM | POA: Diagnosis present

## 2014-06-12 DIAGNOSIS — Z905 Acquired absence of kidney: Secondary | ICD-10-CM

## 2014-06-12 DIAGNOSIS — N2581 Secondary hyperparathyroidism of renal origin: Secondary | ICD-10-CM | POA: Diagnosis present

## 2014-06-12 DIAGNOSIS — W19XXXA Unspecified fall, initial encounter: Secondary | ICD-10-CM | POA: Diagnosis present

## 2014-06-12 DIAGNOSIS — Z7982 Long term (current) use of aspirin: Secondary | ICD-10-CM

## 2014-06-12 LAB — URINE MICROSCOPIC-ADD ON

## 2014-06-12 LAB — COMPREHENSIVE METABOLIC PANEL
ALBUMIN: 3.3 g/dL — AB (ref 3.5–5.2)
ALBUMIN: 3.2 g/dL — AB (ref 3.5–5.2)
ALT: 12 U/L (ref 0–53)
ALT: 11 U/L (ref 0–53)
AST: 11 U/L (ref 0–37)
AST: 10 U/L (ref 0–37)
Alkaline Phosphatase: 57 U/L (ref 39–117)
Alkaline Phosphatase: 53 U/L (ref 39–117)
Anion gap: 31 — ABNORMAL HIGH (ref 5–15)
Anion gap: 28 — ABNORMAL HIGH (ref 5–15)
BUN: 146 mg/dL — ABNORMAL HIGH (ref 6–23)
BUN: 144 mg/dL — AB (ref 6–23)
CALCIUM: 6.7 mg/dL — AB (ref 8.4–10.5)
CHLORIDE: 94 meq/L — AB (ref 96–112)
CO2: 13 mEq/L — ABNORMAL LOW (ref 19–32)
CO2: 15 mEq/L — ABNORMAL LOW (ref 19–32)
Calcium: 6.7 mg/dL — ABNORMAL LOW (ref 8.4–10.5)
Chloride: 92 mEq/L — ABNORMAL LOW (ref 96–112)
Creatinine, Ser: 18.85 mg/dL — ABNORMAL HIGH (ref 0.50–1.35)
Creatinine, Ser: 18.7 mg/dL — ABNORMAL HIGH (ref 0.50–1.35)
GFR calc Af Amer: 3 mL/min — ABNORMAL LOW (ref >90)
GFR calc Af Amer: 3 mL/min — ABNORMAL LOW (ref >90)
GFR calc non Af Amer: 2 mL/min — ABNORMAL LOW (ref >90)
GFR, EST NON AFRICAN AMERICAN: 2 mL/min — AB (ref >90)
Glucose, Bld: 134 mg/dL — ABNORMAL HIGH (ref 70–99)
Glucose, Bld: 60 mg/dL — ABNORMAL LOW (ref 70–99)
Potassium: 4.7 mEq/L (ref 3.7–5.3)
Potassium: 4.4 mEq/L (ref 3.7–5.3)
Sodium: 136 mEq/L — ABNORMAL LOW (ref 137–147)
Sodium: 137 mEq/L (ref 137–147)
Total Bilirubin: 0.3 mg/dL (ref 0.3–1.2)
Total Bilirubin: 0.2 mg/dL — ABNORMAL LOW (ref 0.3–1.2)
Total Protein: 7.1 g/dL (ref 6.0–8.3)
Total Protein: 6.8 g/dL (ref 6.0–8.3)

## 2014-06-12 LAB — URINALYSIS, ROUTINE W REFLEX MICROSCOPIC
Bilirubin Urine: NEGATIVE
Glucose, UA: 100 mg/dL — AB
KETONES UR: NEGATIVE mg/dL
LEUKOCYTES UA: NEGATIVE
NITRITE: NEGATIVE
Protein, ur: >300 mg/dL — AB
SPECIFIC GRAVITY, URINE: 1.012 (ref 1.005–1.030)
Urobilinogen, UA: 0.2 mg/dL (ref 0.0–1.0)
pH: 6 (ref 5.0–8.0)

## 2014-06-12 LAB — CBC WITH DIFFERENTIAL/PLATELET
BASOS ABS: 0 10*3/uL (ref 0.0–0.1)
BASOS PCT: 0 % (ref 0–1)
EOS ABS: 0.3 10*3/uL (ref 0.0–0.7)
Eosinophils Relative: 4 % (ref 0–5)
HEMATOCRIT: 25.4 % — AB (ref 39.0–52.0)
Hemoglobin: 8.5 g/dL — ABNORMAL LOW (ref 13.0–17.0)
Lymphocytes Relative: 10 % — ABNORMAL LOW (ref 12–46)
Lymphs Abs: 0.9 10*3/uL (ref 0.7–4.0)
MCH: 28.9 pg (ref 26.0–34.0)
MCHC: 33.5 g/dL (ref 30.0–36.0)
MCV: 86.4 fL (ref 78.0–100.0)
MONO ABS: 0.6 10*3/uL (ref 0.1–1.0)
Monocytes Relative: 7 % (ref 3–12)
Neutro Abs: 7 10*3/uL (ref 1.7–7.7)
Neutrophils Relative %: 79 % — ABNORMAL HIGH (ref 43–77)
Platelets: 252 10*3/uL (ref 150–400)
RBC: 2.94 MIL/uL — ABNORMAL LOW (ref 4.22–5.81)
RDW: 14 % (ref 11.5–15.5)
WBC: 8.8 10*3/uL (ref 4.0–10.5)

## 2014-06-12 LAB — LIPASE, BLOOD: Lipase: 95 U/L — ABNORMAL HIGH (ref 11–59)

## 2014-06-12 LAB — I-STAT CG4 LACTIC ACID, ED: Lactic Acid, Venous: 0.53 mmol/L (ref 0.5–2.2)

## 2014-06-12 LAB — CBG MONITORING, ED: Glucose-Capillary: 137 mg/dL — ABNORMAL HIGH (ref 70–99)

## 2014-06-12 LAB — GLUCOSE, CAPILLARY
GLUCOSE-CAPILLARY: 70 mg/dL (ref 70–99)
Glucose-Capillary: 67 mg/dL — ABNORMAL LOW (ref 70–99)
Glucose-Capillary: 60 mg/dL — ABNORMAL LOW (ref 70–99)

## 2014-06-12 MED ORDER — ACETAMINOPHEN 325 MG PO TABS
650.0000 mg | ORAL_TABLET | Freq: Four times a day (QID) | ORAL | Status: DC | PRN
  Administered 2014-06-14 – 2014-06-21 (×6): 650 mg via ORAL
  Filled 2014-06-12 (×6): qty 2

## 2014-06-12 MED ORDER — OXYCODONE-ACETAMINOPHEN 5-325 MG PO TABS
2.0000 | ORAL_TABLET | ORAL | Status: DC | PRN
  Administered 2014-06-12 – 2014-06-15 (×5): 2 via ORAL
  Filled 2014-06-12 (×5): qty 2

## 2014-06-12 MED ORDER — LIRAGLUTIDE 18 MG/3ML ~~LOC~~ SOPN: 1.2000 mg | PEN_INJECTOR | Freq: Every day | SUBCUTANEOUS | Status: DC

## 2014-06-12 MED ORDER — SODIUM CHLORIDE 0.9 % IV SOLN
Freq: Once | INTRAVENOUS | Status: AC
  Administered 2014-06-12: 18:00:00 via INTRAVENOUS

## 2014-06-12 MED ORDER — INSULIN ASPART 100 UNIT/ML ~~LOC~~ SOLN: 0.0000 [IU] | Freq: Every day | SUBCUTANEOUS | Status: DC

## 2014-06-12 MED ORDER — HYDROMORPHONE HCL PF 1 MG/ML IJ SOLN
1.0000 mg | INTRAMUSCULAR | Status: DC
Start: 2014-06-12 — End: 2014-06-12

## 2014-06-12 MED ORDER — ASPIRIN EC 81 MG PO TBEC
81.0000 mg | DELAYED_RELEASE_TABLET | Freq: Every day | ORAL | Status: DC
  Administered 2014-06-13 – 2014-06-21 (×8): 81 mg via ORAL
  Filled 2014-06-12 (×9): qty 1

## 2014-06-12 MED ORDER — HYDRALAZINE HCL 50 MG PO TABS
100.0000 mg | ORAL_TABLET | Freq: Two times a day (BID) | ORAL | Status: DC
  Administered 2014-06-12 – 2014-06-21 (×17): 100 mg via ORAL
  Filled 2014-06-12 (×19): qty 2

## 2014-06-12 MED ORDER — SODIUM CHLORIDE 0.9 % IV BOLUS (SEPSIS)
250.0000 mL | Freq: Once | INTRAVENOUS | Status: AC
Start: 2014-06-12 — End: 2014-06-12
  Administered 2014-06-12: 250 mL via INTRAVENOUS

## 2014-06-12 MED ORDER — ONDANSETRON HCL 4 MG/2ML IJ SOLN
4.0000 mg | Freq: Four times a day (QID) | INTRAMUSCULAR | Status: DC | PRN
  Administered 2014-06-12 – 2014-06-17 (×5): 4 mg via INTRAVENOUS
  Filled 2014-06-12 (×6): qty 2

## 2014-06-12 MED ORDER — DIAZEPAM 5 MG PO TABS
5.0000 mg | ORAL_TABLET | Freq: Once | ORAL | Status: AC
  Administered 2014-06-12: 5 mg via ORAL
  Filled 2014-06-12: qty 1

## 2014-06-12 MED ORDER — TRAMADOL HCL 50 MG PO TABS
50.0000 mg | ORAL_TABLET | Freq: Two times a day (BID) | ORAL | Status: DC | PRN
  Administered 2014-06-12: 50 mg via ORAL
  Filled 2014-06-12 (×3): qty 1

## 2014-06-12 MED ORDER — CAMPHOR-MENTHOL 0.5-0.5 % EX LOTN
1.0000 "application " | TOPICAL_LOTION | Freq: Three times a day (TID) | CUTANEOUS | Status: DC | PRN
  Administered 2014-06-12: 1 via TOPICAL
  Filled 2014-06-12: qty 222

## 2014-06-12 MED ORDER — VITAMIN E 180 MG (400 UNIT) PO CAPS
400.0000 [IU] | ORAL_CAPSULE | Freq: Every morning | ORAL | Status: DC
  Administered 2014-06-13 – 2014-06-21 (×9): 400 [IU] via ORAL
  Filled 2014-06-12 (×10): qty 1

## 2014-06-12 MED ORDER — MORPHINE SULFATE 4 MG/ML IJ SOLN
4.0000 mg | Freq: Once | INTRAMUSCULAR | Status: AC
  Administered 2014-06-12: 4 mg via INTRAVENOUS
  Filled 2014-06-12: qty 1

## 2014-06-12 MED ORDER — ATORVASTATIN CALCIUM 40 MG PO TABS
40.0000 mg | ORAL_TABLET | Freq: Every day | ORAL | Status: DC
  Administered 2014-06-12 – 2014-06-20 (×9): 40 mg via ORAL
  Filled 2014-06-12 (×10): qty 1

## 2014-06-12 MED ORDER — NEBIVOLOL HCL 5 MG PO TABS: 15.0000 mg | ORAL_TABLET | Freq: Every morning | ORAL | Status: DC

## 2014-06-12 MED ORDER — EZETIMIBE 10 MG PO TABS
10.0000 mg | ORAL_TABLET | Freq: Every evening | ORAL | Status: DC
  Administered 2014-06-12 – 2014-06-20 (×9): 10 mg via ORAL
  Filled 2014-06-12 (×10): qty 1

## 2014-06-12 MED ORDER — HYDROXYZINE HCL 25 MG PO TABS: 25.0000 mg | ORAL_TABLET | Freq: Three times a day (TID) | ORAL | Status: DC | PRN

## 2014-06-12 MED ORDER — NEBIVOLOL HCL 5 MG PO TABS
15.0000 mg | ORAL_TABLET | Freq: Every morning | ORAL | Status: DC
  Administered 2014-06-13 – 2014-06-21 (×9): 15 mg via ORAL
  Filled 2014-06-12 (×9): qty 1

## 2014-06-12 MED ORDER — DOCUSATE SODIUM 100 MG PO CAPS
100.0000 mg | ORAL_CAPSULE | Freq: Every evening | ORAL | Status: DC
  Administered 2014-06-13 – 2014-06-20 (×8): 100 mg via ORAL
  Filled 2014-06-12 (×9): qty 1

## 2014-06-12 MED ORDER — CALCIUM CARBONATE 1250 MG/5ML PO SUSP: 500.0000 mg | Freq: Four times a day (QID) | ORAL | Status: DC | PRN

## 2014-06-12 MED ORDER — LEVOTHYROXINE SODIUM 100 MCG PO TABS
100.0000 ug | ORAL_TABLET | Freq: Every day | ORAL | Status: DC
  Administered 2014-06-13 – 2014-06-21 (×9): 100 ug via ORAL
  Filled 2014-06-12 (×11): qty 1

## 2014-06-12 MED ORDER — DELFLEX-LM/1.5% DEXTROSE 346 MOSM/L IP SOLN: INTRAPERITONEAL | Status: DC

## 2014-06-12 MED ORDER — CALCIUM CARBONATE ANTACID 500 MG PO CHEW
400.0000 mg | CHEWABLE_TABLET | Freq: Three times a day (TID) | ORAL | Status: DC
  Administered 2014-06-13: 400 mg via ORAL
  Filled 2014-06-12 (×5): qty 2

## 2014-06-12 MED ORDER — ONDANSETRON HCL 4 MG/2ML IJ SOLN
4.0000 mg | Freq: Once | INTRAMUSCULAR | Status: AC
  Administered 2014-06-12: 4 mg via INTRAVENOUS
  Filled 2014-06-12: qty 2

## 2014-06-12 MED ORDER — DOCUSATE SODIUM 283 MG RE ENEM
1.0000 | ENEMA | RECTAL | Status: DC | PRN
  Administered 2014-06-16: 283 mg via RECTAL
  Filled 2014-06-12: qty 0.2

## 2014-06-12 MED ORDER — INSULIN REGULAR HUMAN (CONC) 500 UNIT/ML ~~LOC~~ SOLN: 5.0000 [IU] | Freq: Three times a day (TID) | SUBCUTANEOUS | Status: DC

## 2014-06-12 MED ORDER — ONDANSETRON HCL 4 MG PO TABS
4.0000 mg | ORAL_TABLET | Freq: Four times a day (QID) | ORAL | Status: DC | PRN
  Administered 2014-06-14 – 2014-06-19 (×4): 4 mg via ORAL
  Filled 2014-06-12 (×5): qty 1

## 2014-06-12 MED ORDER — PROMETHAZINE HCL 25 MG/ML IJ SOLN
25.0000 mg | Freq: Four times a day (QID) | INTRAMUSCULAR | Status: DC | PRN
  Administered 2014-06-12 – 2014-06-14 (×2): 25 mg via INTRAVENOUS
  Filled 2014-06-12 (×2): qty 1

## 2014-06-12 MED ORDER — DELFLEX-LC/1.5% DEXTROSE 346 MOSM/L IP SOLN: Freq: Once | INTRAPERITONEAL | Status: DC

## 2014-06-12 MED ORDER — INSULIN GLARGINE 100 UNIT/ML SOLOSTAR PEN: 11.0000 [IU] | PEN_INJECTOR | Freq: Every morning | SUBCUTANEOUS | Status: DC

## 2014-06-12 MED ORDER — SORBITOL 70 % SOLN
30.0000 mL | Status: DC | PRN
  Administered 2014-06-13 – 2014-06-16 (×5): 30 mL via ORAL
  Filled 2014-06-12 (×5): qty 30

## 2014-06-12 MED ORDER — INSULIN GLARGINE 100 UNIT/ML ~~LOC~~ SOLN
11.0000 [IU] | Freq: Every day | SUBCUTANEOUS | Status: DC
  Administered 2014-06-13 – 2014-06-21 (×9): 11 [IU] via SUBCUTANEOUS
  Filled 2014-06-12 (×9): qty 0.11

## 2014-06-12 MED ORDER — HYDRALAZINE HCL 50 MG PO TABS
100.0000 mg | ORAL_TABLET | Freq: Two times a day (BID) | ORAL | Status: DC
  Filled 2014-06-12: qty 2

## 2014-06-12 MED ORDER — SODIUM CHLORIDE 0.9 % IJ SOLN
3.0000 mL | Freq: Two times a day (BID) | INTRAMUSCULAR | Status: DC
  Administered 2014-06-12 – 2014-06-20 (×13): 3 mL via INTRAVENOUS

## 2014-06-12 MED ORDER — INSULIN ASPART 100 UNIT/ML ~~LOC~~ SOLN: 0.0000 [IU] | Freq: Three times a day (TID) | SUBCUTANEOUS | Status: DC

## 2014-06-12 MED ORDER — ACETAMINOPHEN 650 MG RE SUPP: 650.0000 mg | Freq: Four times a day (QID) | RECTAL | Status: DC | PRN

## 2014-06-12 MED ORDER — INSULIN ASPART 100 UNIT/ML ~~LOC~~ SOLN
0.0000 [IU] | Freq: Three times a day (TID) | SUBCUTANEOUS | Status: DC
  Administered 2014-06-13: 10:00:00 via SUBCUTANEOUS
  Administered 2014-06-13 – 2014-06-14 (×3): 3 [IU] via SUBCUTANEOUS
  Administered 2014-06-15 – 2014-06-19 (×4): 4 [IU] via SUBCUTANEOUS
  Administered 2014-06-20: 3 [IU] via SUBCUTANEOUS
  Administered 2014-06-20: 8 [IU] via SUBCUTANEOUS
  Administered 2014-06-21: 4 [IU] via SUBCUTANEOUS

## 2014-06-12 MED ORDER — HYDROMORPHONE HCL PF 1 MG/ML IJ SOLN
1.0000 mg | INTRAMUSCULAR | Status: AC
Start: 2014-06-12 — End: 2014-06-12
  Administered 2014-06-12: 1 mg via INTRAVENOUS
  Filled 2014-06-12: qty 1

## 2014-06-12 MED ORDER — ASPIRIN 81 MG PO TABS: 81.0000 mg | ORAL_TABLET | Freq: Every morning | ORAL | Status: DC

## 2014-06-12 NOTE — Progress Notes (Signed)
Pt admitted to [facility] from ED. Ambulated with standby assist to from stretcher to bed. A&Ox4. VSS. Orthostatics done. No telemetry ordered. Skin intact. LBM about 5 days ago. Pt c/o nausea. Last zofran 1049. PD cath R upper quad. Pt reports placed May 18, 2014. Orders reviewed and discussed with pt. Chronic renal failure pathway initiated. Family at bedside. Will continue to monitor. Manya Silvas, RN

## 2014-06-12 NOTE — ED Provider Notes (Signed)
CSN: [id number]     Arrival date & time 06/12/14  0808 History   First MD Initiated Contact with Patient 06/12/14 [phone]     Chief Complaint  Patient presents with  . Emesis     (Consider location/radiation/quality/duration/timing/severity/associated sxs/prior Treatment) The history is provided by the patient.    Pt with hx DM and ESRD starting on peritoneal dialysis this week with peritoneal catheter placement August 11, presents with N/V, right sided flank/abdominal pain x 5 days.  Has been unable to sleep or keep anything down for the past three days.  Emesis is green. Became lightheaded and fell three days ago, fell directly on right shoulder and has had constant pain since.  Is taking tylenol with relief.  Denies LOC, head injury, or neck pain. Is urinating dark orange, difficulty initiating stream since peritoneal catheter placed. Is having dark stools.   Denies fevers, chills, myalgias, chest pain, hematochezia, diarrhea.    Abdominal surgical hx: left nephrectomy (congenital problem), appendectomy, splenectomy.   Past Medical History  Diagnosis Date  . Diabetes mellitus without complication   . Hypertension   . Thyroid disease   . Sleep apnea 06/18/11    split-night sleep study- [facility].  . Hyperlipidemia   . PAT (paroxysmal atrial tachycardia)   . PSVT (paroxysmal supraventricular tachycardia)   . Blood transfusion without reported diagnosis   . Chronic kidney disease     Stage IV  . CPAP (continuous positive airway pressure) dependence   . Dysrhythmia   . Shortness of breath   . Hypothyroidism   . GERD (gastroesophageal reflux disease)     n/v a lot  . Headache(784.0)     slight dull h/a due kidney failure  . Cancer     skin ca right shoulder  . Anemia     low hgb at present  . Umbilical hernia     will repair with thi surgery   Past Surgical History  Procedure Laterality Date  . Nephrectomy Left 1974  . Renal biopsy Right 2012  .  Skin cancer excision    . Appendectomy  1974  . Capd insertion N/A 05/18/2014    Procedure: LAPAROSCOPIC INSERTION CONTINUOUS AMBULATORY PERITONEAL DIALYSIS  (CAPD) CATHETER;  Surgeon: Ralene Ok, MD;  Location: [place] [facility];  Service: General;  Laterality: N/A;  . Umbilical hernia repair N/A 05/18/2014    Procedure: HERNIA REPAIR UMBILICAL ADULT;  Surgeon: Ralene Ok, MD;  Location: [facility] OR;  Service: General;  Laterality: N/A;   Family History  Problem Relation Age of Onset  . Colon polyps Father   . Colon cancer Neg Hx    History  Substance Use Topics  . Smoking status: Former Smoker    Types: Pipe    Quit date: 12/06/2013  . Smokeless tobacco: Never Used  . Alcohol Use: No     Comment: no alcohol since March per pt.    Review of Systems  Respiratory: Positive for shortness of breath (chronic, unchanged from baseline).   Cardiovascular: Positive for leg swelling (chronic, unchanged).  All other systems reviewed and are negative.     Allergies  Review of patient's allergies indicates no known allergies.  Home Medications   Prior to Admission medications   Medication Sig Start Date End Date Taking? Authorizing Provider  aspirin 81 MG tablet Take 81 mg by mouth every other day.    Historical Provider, MD  BYSTOLIC 10 MG tablet Take 15 mg by mouth daily.  08/10/13  Historical Provider, MD  cetirizine (ZYRTEC) 10 MG tablet Take 10 mg by mouth daily.    Historical Provider, MD  Cholecalciferol (VITAMIN D) 2000 UNITS tablet Take 4,000 Units by mouth daily.     Historical Provider, MD  desoximetasone (TOPICORT) 0.25 % cream Apply 1 application topically daily.    Historical Provider, MD  docusate sodium (COLACE) 100 MG capsule Take 100 mg by mouth daily.     Historical Provider, MD  ezetimibe (ZETIA) 10 MG tablet Take 10 mg by mouth daily.    Historical Provider, MD  fluocinonide cream (LIDEX) [id number] % Apply 1 application topically daily.    Historical Provider, MD  furosemide  (LASIX) 80 MG tablet Take 160 mg by mouth 2 (two) times daily.     Historical Provider, MD  gentamicin cream (GARAMYCIN) 0.1 %  06/02/14   Historical Provider, MD  hydrALAZINE (APRESOLINE) 50 MG tablet Take 75 mg by mouth 2 (two) times daily.  08/10/13   Historical Provider, MD  Insulin Glargine (LANTUS SOLOSTAR) 100 UNIT/ML Solostar Pen Inject 26 Units into the skin daily at 10 pm. 03/23/14   Elayne Snare, MD  Insulin Pen Needle 32G X 4 MM MISC Use 4 pen needles per day to inject insulin 01/05/14   Elayne Snare, MD  insulin regular human CONCENTRATED (HUMULIN R) 500 UNIT/ML SOLN injection 8/5/8 units daily. 01/05/14   Elayne Snare, MD  lactulose, encephalopathy, (CHRONULAC) 10 GM/15ML SOLN  05/24/14   Historical Provider, MD  levothyroxine (SYNTHROID, LEVOTHROID) 100 MCG tablet Take 1 tablet (100 mcg total) by mouth daily. 01/05/14   Elayne Snare, MD  Liraglutide (VICTOZA) 18 MG/3ML SOPN Inject 1.2 mg into the skin daily. 01/04/14   Elayne Snare, MD  NON FORMULARY every evening. CPAP therapy.    Historical Provider, MD  oxyCODONE-acetaminophen (ROXICET) 5-325 MG per tablet Take 1-2 tablets by mouth every 4 (four) hours as needed. 05/21/14   Excell Seltzer, MD  rosuvastatin (CRESTOR) 20 MG tablet Take 1 tablet (20 mg total) by mouth daily. 01/05/14   Elayne Snare, MD  senna (SENOKOT) 8.6 MG tablet Take 1 tablet by mouth daily.    Historical Provider, MD  sildenafil (VIAGRA) 100 MG tablet Take 50 mg by mouth as needed for erectile dysfunction.     Historical Provider, MD  vitamin E 400 UNIT capsule Take 400 Units by mouth daily.    Historical Provider, MD   There were no vitals taken for this visit. Physical Exam  Nursing note and vitals reviewed. Constitutional: He appears well-developed and well-nourished. No distress.  HENT:  Head: Normocephalic and atraumatic.  Neck: Neck supple.  Cardiovascular: Normal rate and regular rhythm.   Pulmonary/Chest: Effort normal and breath sounds normal. No respiratory  distress. He has no wheezes. He has no rales.  Abdominal: Soft. He exhibits no distension and no mass. There is tenderness. There is no rebound and no guarding.  Obese. Right flank and area around RUQ peritoneal catheter tender to palpation. No erythema, warmth, or discharge from around catheter.   Musculoskeletal:       Right shoulder: He exhibits tenderness and pain. He exhibits no crepitus, no deformity, no laceration, no spasm, normal pulse and normal strength.       Arms: Neurological: He is alert. He exhibits normal muscle tone.  Skin: He is not diaphoretic.    ED Course  Procedures (including critical care time) Labs Review Labs Reviewed  CBC WITH DIFFERENTIAL - Abnormal; Notable for the following:    RBC  2.94 (*)    Hemoglobin 8.5 (*)    HCT 25.4 (*)    Neutrophils Relative % 79 (*)    Lymphocytes Relative 10 (*)    All other components within normal limits  COMPREHENSIVE METABOLIC PANEL - Abnormal; Notable for the following:    Sodium 136 (*)    Chloride 92 (*)    CO2 13 (*)    Glucose, Bld 134 (*)    BUN 146 (*)    Creatinine, Ser 18.85 (*)    Calcium 6.7 (*)    Albumin 3.3 (*)    GFR calc non Af Amer 2 (*)    GFR calc Af Amer 3 (*)    Anion gap 31 (*)    All other components within normal limits  LIPASE, BLOOD - Abnormal; Notable for the following:    Lipase 95 (*)    All other components within normal limits  CBG MONITORING, ED - Abnormal; Notable for the following:    Glucose-Capillary 137 (*)    All other components within normal limits  URINALYSIS, ROUTINE W REFLEX MICROSCOPIC  I-STAT CG4 LACTIC ACID, ED    Imaging Review Ct Abdomen Pelvis Wo Contrast  06/12/2014   CLINICAL DATA:  Vomiting  EXAM: CT ABDOMEN AND PELVIS WITHOUT CONTRAST  TECHNIQUE: Multidetector CT imaging of the abdomen and pelvis was performed following the standard protocol without IV contrast.  COMPARISON:  05/05/2014  FINDINGS: Bibasilar linear atelectasis  Minimal aortic valve  calcification  Calcified granuloma in the liver.  Gallbladder, spleen, pancreas, adrenal glands are within normal limits  Small 17 mm cysts in the left renal fossa is stable  Peritoneal dialysis catheter in the right lower quadrant has been placed.  Stable appearance of the right kidney with a prominent renal pelvis and perinephric stranding. No caliectasis.  Small para-aortic and iliac chain nodes.  Small left inguinal hernia containing adipose tissue.  Diverticulosis of the descending and sigmoid colon.  Appendicolith.  No obvious evidence of acute appendicitis.  Small lipoma in the distal ileum on image 77 is stable.  Umbilical hernia contains fat. Right posterior and lateral abdominal wall hernia containing retroperitoneal fat. This is stable.  Lumbar degenerative change.  Stranding in the left abdominal anterior subcutaneous fat is stable and of unknown significance.  IMPRESSION: No acute disease. Chronic and postoperative changes. Peritoneal dialysis catheter has been placed.   Electronically Signed   By: Maryclare Bean M.D.   On: 06/12/2014 10:10   Dg Chest 2 View  06/12/2014   CLINICAL DATA:  Fall.  Shortness of breath, dizziness and vomiting.  EXAM: CHEST - 2 VIEW  COMPARISON:  05/11/2014  FINDINGS: Stable bilateral pleural thickening and subtle areas of nodularity in both lungs with suspected calcified granuloma of the left upper lobe. Scattered areas of parenchymal scarring have a stable appearance bilaterally. The heart size is stable and at the upper limits of normal. There is no evidence of pulmonary edema, consolidation, pneumothorax or pleural fluid.  IMPRESSION: Stable chronic pleural thickening and areas of parenchymal nodularity and scarring bilaterally. It was mentioned on the prior chest x-ray report that these changes appear to be progressive by chest x-ray. Recommend eventual correlation, as previously recommended, with CT of the chest.   Electronically Signed   By: Aletta Edouard M.D.   On:  06/12/2014 09:51   Dg Shoulder Right  06/12/2014   CLINICAL DATA:  Fall with right shoulder pain.  EXAM: RIGHT SHOULDER - 2+ VIEW  COMPARISON:  01/28/2014  FINDINGS: No acute fracture or dislocation is identified. Stable degenerative changes are seen involving the [name] joint as well as the humeral head. No bony lesions or destruction. Soft tissues are unremarkable.  IMPRESSION: No acute fracture or dislocation.  Stable degenerative changes.   Electronically Signed   By: Aletta Edouard M.D.   On: 06/12/2014 09:45     EKG Interpretation None      8:46 AM Discussed pt with Dr Aline Brochure who will also see and examine the patient.   10:47 AM I spoke with nephrologist on call who agrees to admission.   MDM   Final diagnoses:  Renal failure  Non-intractable vomiting with nausea, vomiting of unspecified type    Afebrile nontoxic but uncomfortable patient with ESRD planning to start peritoneal dialysis next week, with N/V right flank/abdominal pain x 5 days.  Renal function has gotten significantly worse in a short period of time BUN creat 84, 10  On 04/07/14 to 146 and 18 today.  Pt now acidotic with CO2 13 and anion gap 31.  Suspect he is also very dehydrated from vomiting.  CT abd/pelvis unremarkable.  WBC normal.  Right shoulder xray negative (likely sore from fall).  CXR shows chronic changes - discussed with patient and family member and advised to follow up with PCP for further evaluation.  Patient admitted to Nephrology team to start dialysis.       [place], PA-C 06/12/14 [phone]

## 2014-06-12 NOTE — H&P (Signed)
Robert [name]  HISTORY AND PHYSICAL  Robert [name] is an 53 y.o. male.    Chief Complaint: Nausea/vomiting / weakness HPI: 53 yo gentleman followed by Dr. Moshe [name]  with ckd secondary to  DM in setting of single kidney,now presents with uremic type symptoms as above, with  BUN 146, Cr 18.85. Co2 18 , K 4.7, HGB 8.5. He has a PD cath in place (flushed 3 times as op  Without problems) placed  05/18/14 and had plans to start op pd at [place]  on Wed 05/18/14 per pt.He has had progressive nausea ( no food for at least 3 days), bad taste in mouth, vomiting,no constipation or diarrhea. No fevers, chills,  Sob or chest pain. He reports falling on right shoulder at home  felt dizzy from weakness, went to ER "no fracture and now taking Tylenol  With minor  Relief.' Some right shoulder discomfort with ROM. Xray in ER shows no fracture or dislocation some DJD changes. Also co of leg cramps and mild increased Neuropathy in legs recently.   PMH: Past Medical History  Diagnosis Date  . Diabetes mellitus without complication   . Hypertension   . Thyroid disease   . Sleep apnea 06/18/11    split-night sleep study- [facility].  . Hyperlipidemia   . PAT (paroxysmal atrial tachycardia)   . PSVT (paroxysmal supraventricular tachycardia)   . Blood transfusion without reported diagnosis   . Chronic kidney disease     Stage IV  . CPAP (continuous positive airway pressure) dependence   . Dysrhythmia   . Shortness of breath   . Hypothyroidism   . GERD (gastroesophageal reflux disease)     n/v a lot  . Headache(784.0)     slight dull h/a due kidney failure  . Cancer     skin ca right shoulder  . Anemia     low hgb at present  . Umbilical hernia     will repair with thi surgery   PSH: Past Surgical History  Procedure Laterality Date  . Nephrectomy Left 1974  . Renal biopsy Right 2012  . Skin cancer excision    . Appendectomy  1974  . Capd insertion N/A  05/18/2014    Procedure: LAPAROSCOPIC INSERTION CONTINUOUS AMBULATORY PERITONEAL DIALYSIS  (CAPD) CATHETER;  Surgeon: Ralene Ok, MD;  Location: [place];  Service: General;  Laterality: N/A;  . Umbilical hernia repair N/A 05/18/2014    Procedure: HERNIA REPAIR UMBILICAL ADULT;  Surgeon: Ralene Ok, MD;  Location: [place];  Service: General;  Laterality: N/A;    Past Medical History  Diagnosis Date  . Diabetes mellitus without complication   . Hypertension   . Thyroid disease   . Sleep apnea 06/18/11    split-night sleep study- [facility].  . Hyperlipidemia   . PAT (paroxysmal atrial tachycardia)   . PSVT (paroxysmal supraventricular tachycardia)   . Blood transfusion without reported diagnosis   . Chronic kidney disease     Stage IV  . CPAP (continuous positive airway pressure) dependence   . Dysrhythmia   . Shortness of breath   . Hypothyroidism   . GERD (gastroesophageal reflux disease)     n/v a lot  . Headache(784.0)     slight dull h/a due kidney failure  . Cancer     skin ca right shoulder  . Anemia     low hgb at present  . Umbilical hernia     will  repair with thi surgery    Medications:  CLE:[id number], acetaminophen, calcium carbonate (dosed in mg elemental calcium), camphor-menthol, docusate sodium, hydrOXYzine, ondansetron (ZOFRAN) IV, ondansetron, sorbitol  Medications Prior to Admission  Medication Sig Dispense Refill  . aspirin 81 MG tablet Take 81 mg by mouth every morning.       Marland Kitchen BYSTOLIC 10 MG tablet Take 15 mg by mouth every morning.       . cetirizine (ZYRTEC) 10 MG tablet Take 10 mg by mouth every morning.       . Cholecalciferol (VITAMIN D) 2000 UNITS tablet Take 4,000 Units by mouth every morning.       . Coenzyme Q-10 100 MG capsule Take 100 mg by mouth every morning.      . docusate sodium (COLACE) 100 MG capsule Take 100 mg by mouth every evening.       . ezetimibe (ZETIA) 10 MG tablet Take 10 mg by mouth every  evening.       . fluocinonide cream (LIDEX) 6.75 % Apply 1 application topically daily as needed (Skin inflammation (hand, thigh, back)).       . furosemide (LASIX) 80 MG tablet Take 160 mg by mouth 2 (two) times daily.       Marland Kitchen Hyaluronic Acid-Vitamin C (HYALURONIC ACID PO) Take 20 mg by mouth every morning.      . hydrALAZINE (APRESOLINE) 50 MG tablet Take 100 mg by mouth 2 (two) times daily.       . Insulin Glargine (LANTUS SOLOSTAR) 100 UNIT/ML Solostar Pen Inject 22 Units into the skin at bedtime.      . insulin regular human CONCENTRATED (HUMULIN R) 500 UNIT/ML SOLN injection Inject 5 Units into the skin 3 (three) times daily with meals.      . lactulose, encephalopathy, (CHRONULAC) 10 GM/15ML SOLN Take 20-40 g by mouth daily as needed (Laxative).       Marland Kitchen levothyroxine (SYNTHROID, LEVOTHROID) 100 MCG tablet Take 1 tablet (100 mcg total) by mouth daily.  90 tablet  1  . Liraglutide (VICTOZA) 18 MG/3ML SOPN Inject 1.2 mg into the skin daily.  6 pen  1  . rosuvastatin (CRESTOR) 20 MG tablet Take 1 tablet (20 mg total) by mouth daily.  90 tablet  1  . senna (SENOKOT) 8.6 MG tablet Take 1 tablet by mouth every evening.       . vitamin E 400 UNIT capsule Take 400 Units by mouth every morning.       Marland Kitchen gentamicin cream (GARAMYCIN) 0.1 % Apply 1 application topically once a week. Apply weekly to peritoneal catheter      . Insulin Pen Needle 32G X 4 MM MISC Use 4 pen needles per day to inject insulin  250 each  1  . NON FORMULARY every evening. CPAP therapy.        ALLERGIES:  No Known Allergies  FAM HX=NA : Family History  Problem Relation Age of Onset  . Colon polyps Father   . Colon cancer Neg Hx     Social History: He  reports that he quit smoking about 6 months ago. His smoking use included Pipe. He has never used smokeless tobacco. He reports that he does not drink alcohol or use illicit drugs.Married , lives in [place], 3 children, works as Government social research officer.  ROS: See positives in  HPI   Physical Exam: Filed Vitals:   06/12/14 1317  BP: 153/82  Pulse: 66  Temp:   Resp: 16  General: Alert, Obese WM, NAD, a but appearing uncomfortable in ER with occasional cramping of legs HEENT:[place] ,  MM dry, eomi,  Neck:supple, No JVD Heart:RRR, 2/6 sem lsb, no rub or gallop Lungs:CTA bilat  Abdomen:obese, PD cath in place nontender, ND, BS pos. Normal Extremities:trace bipedal edema Neuro:OX3, moves all extrem. Pos. [place]ix[place], no gross focal deficits Skin:warm dry, no overt rash Dialysis Access: PD perm cath    Results for orders placed during the hospital encounter of 06/12/14 (from the past 48 hour(s))  CBG MONITORING, ED     Status: Abnormal   Collection Time    06/12/14  8:30 AM      Result Value Ref Range   Glucose-Capillary 137 (*) 70 - 99 mg/dL  CBC WITH DIFFERENTIAL     Status: Abnormal   Collection Time    06/12/14  8:40 AM      Result Value Ref Range   WBC 8.8  4.0 - 10.5 K/uL   RBC 2.94 (*) 4.22 - 5.81 MIL/uL   Hemoglobin 8.5 (*) 13.0 - 17.0 g/dL   HCT 25.4 (*) 39.0 - 52.0 %   MCV 86.4  78.0 - 100.0 fL   MCH 28.9  26.0 - 34.0 pg   MCHC 33.5  30.0 - 36.0 g/dL   RDW 14.0  11.5 - 15.5 %   Platelets 252  150 - 400 K/uL   Neutrophils Relative % 79 (*) 43 - 77 %   Neutro Abs 7.0  1.7 - 7.7 K/uL   Lymphocytes Relative 10 (*) 12 - 46 %   Lymphs Abs 0.9  0.7 - 4.0 K/uL   Monocytes Relative 7  3 - 12 %   Monocytes Absolute 0.6  0.1 - 1.0 K/uL   Eosinophils Relative 4  0 - 5 %   Eosinophils Absolute 0.3  0.0 - 0.7 K/uL   Basophils Relative 0  0 - 1 %   Basophils Absolute 0.0  0.0 - 0.1 K/uL  COMPREHENSIVE METABOLIC PANEL     Status: Abnormal   Collection Time    06/12/14  8:40 AM      Result Value Ref Range   Sodium 136 (*) 137 - 147 mEq/L   Potassium 4.7  3.7 - 5.3 mEq/L   Chloride 92 (*) 96 - 112 mEq/L   CO2 13 (*) 19 - 32 mEq/L   Glucose, Bld 134 (*) 70 - 99 mg/dL   BUN 146 (*) 6 - 23 mg/dL   Creatinine, Ser 18.85 (*) 0.50 - 1.35 mg/dL    Calcium 6.7 (*) 8.4 - 10.5 mg/dL   Total Protein 7.1  6.0 - 8.3 g/dL   Albumin 3.3 (*) 3.5 - 5.2 g/dL   AST 11  0 - 37 U/L   ALT 12  0 - 53 U/L   Alkaline Phosphatase 57  39 - 117 U/L   Total Bilirubin 0.3  0.3 - 1.2 mg/dL   GFR calc non Af Amer 2 (*) >90 mL/min   GFR calc Af Amer 3 (*) >90 mL/min   Comment: (NOTE)     The eGFR has been calculated using the CKD EPI equation.     This calculation has not been validated in all clinical situations.     eGFR's persistently <90 mL/min signify possible Chronic Kidney     Disease.   Anion gap 31 (*) 5 - 15  LIPASE, BLOOD     Status: Abnormal   Collection Time    06/12/14  8:40 AM  Result Value Ref Range   Lipase 95 (*) 11 - 59 U/L  I-STAT CG4 LACTIC ACID, ED     Status: None   Collection Time    06/12/14  8:49 AM      Result Value Ref Range   Lactic Acid, Venous 0.53  0.5 - 2.2 mmol/L    Ct Abdomen Pelvis Wo Contrast  06/12/2014   CLINICAL DATA:  Vomiting  EXAM: CT ABDOMEN AND PELVIS WITHOUT CONTRAST  TECHNIQUE: Multidetector CT imaging of the abdomen and pelvis was performed following the standard protocol without IV contrast.  COMPARISON:  05/05/2014  FINDINGS: Bibasilar linear atelectasis  Minimal aortic valve calcification  Calcified granuloma in the liver.  Gallbladder, spleen, pancreas, adrenal glands are within normal limits  Small 17 mm cysts in the left renal fossa is stable  Peritoneal dialysis catheter in the right lower quadrant has been placed.  Stable appearance of the right kidney with a prominent renal pelvis and perinephric stranding. No caliectasis.  Small para-aortic and iliac chain nodes.  Small left inguinal hernia containing adipose tissue.  Diverticulosis of the descending and sigmoid colon.  Appendicolith.  No obvious evidence of acute appendicitis.  Small lipoma in the distal ileum on image 77 is stable.  Umbilical hernia contains fat. Right posterior and lateral abdominal wall hernia containing retroperitoneal fat.  This is stable.  Lumbar degenerative change.  Stranding in the left abdominal anterior subcutaneous fat is stable and of unknown significance.  IMPRESSION: No acute disease. Chronic and postoperative changes. Peritoneal dialysis catheter has been placed.   Electronically Signed   By: Maryclare Bean M.D.   On: 06/12/2014 10:10   Dg Chest 2 View  06/12/2014   CLINICAL DATA:  Fall.  Shortness of breath, dizziness and vomiting.  EXAM: CHEST - 2 VIEW  COMPARISON:  05/11/2014  FINDINGS: Stable bilateral pleural thickening and subtle areas of nodularity in both lungs with suspected calcified granuloma of the left upper lobe. Scattered areas of parenchymal scarring have a stable appearance bilaterally. The heart size is stable and at the upper limits of normal. There is no evidence of pulmonary edema, consolidation, pneumothorax or pleural fluid.  IMPRESSION: Stable chronic pleural thickening and areas of parenchymal nodularity and scarring bilaterally. It was mentioned on the prior chest x-ray report that these changes appear to be progressive by chest x-ray. Recommend eventual correlation, as previously recommended, with CT of the chest.   Electronically Signed   By: Aletta Edouard M.D.   On: 06/12/2014 09:51   Dg Shoulder Right  06/12/2014   CLINICAL DATA:  Fall with right shoulder pain.  EXAM: RIGHT SHOULDER - 2+ VIEW  COMPARISON:  01/28/2014  FINDINGS: No acute fracture or dislocation is identified. Stable degenerative changes are seen involving the [facility] joint as well as the humeral head. No bony lesions or destruction. Soft tissues are unremarkable.  IMPRESSION: No acute fracture or dislocation.  Stable degenerative changes.   Electronically Signed   By: Aletta Edouard M.D.   On: 06/12/2014 09:45    Assessment/Plan 1.Uremia now with ESRD= Start Supine PD exchanges/ follow up am labs/ use 1.5 % exchanges for nos 2. HTN/Vol= not vol overloaded/ may be dry  Will N/V, and no po intake sevral days, give iv NS and bp  meds with bp up     slightly  3. IDDM= accu cks with ssc/ lantus when eating/ took his Lantus this am 4. OSA = Cpap use plans to bring from home 5. Anemia=  need to restart ESA ( prior Procrit as OP) /check iron studies as needed check ov records 6. Secondary Hyperparathyroidism = Was on Renvela (could not tolerate ) prior tums ultra /ca/phos and ov records for     pth 7.Right Shoulder Pain sec to ? DJD / sp fall=  Plain films shows only DJD/  8. Hypothyroidism= po meds  ZEYFANG,DAVID W 06/12/2014, 1:24 PM  Renal Attending; Pt has uremic syndrome triggered by decreased Po intake recently.  He needs to begin PD via PD catheter that is 80+ weeks old.  We will begin supine dialysis via cycler with 1.5% solutions. There is an umbilical hernia by CT abd which hopefully is of no significance.  Will volume expand some and address the anemia. Will need to review CT with radiologist due to ? Of pulmonary abnormalities. Haden Suder C

## 2014-06-12 NOTE — Plan of Care (Signed)
Problem: Phase I Progression Outcomes Goal: Up in chair Outcome: Completed/Met Date Met:  06/12/14 Pt sitting on side of bed without difficulty.

## 2014-06-12 NOTE — ED Notes (Addendum)
Pt c/o vomiting x 5 days. Pt has not eaten past 3 days. Pt reports abdominal pain yesterday but not today. Pt has been taking tylenol recently and that has helped with the pain. Pt reports that he had some dizziness couple days ago and fell causing injury to right shoulder area. Pt reports tylenol helping with the pain.

## 2014-06-12 NOTE — ED Notes (Signed)
Patient is in xray, will get  A urine sample when he returns.

## 2014-06-13 LAB — COMPREHENSIVE METABOLIC PANEL
ALT: 10 U/L (ref 0–53)
AST: 11 U/L (ref 0–37)
Albumin: 3 g/dL — ABNORMAL LOW (ref 3.5–5.2)
Alkaline Phosphatase: 52 U/L (ref 39–117)
Anion gap: 26 — ABNORMAL HIGH (ref 5–15)
BUN: 137 mg/dL — ABNORMAL HIGH (ref 6–23)
CALCIUM: 6.6 mg/dL — AB (ref 8.4–10.5)
CO2: 18 mEq/L — ABNORMAL LOW (ref 19–32)
Chloride: 94 mEq/L — ABNORMAL LOW (ref 96–112)
Creatinine, Ser: 17.61 mg/dL — ABNORMAL HIGH (ref 0.50–1.35)
GFR calc Af Amer: 3 mL/min — ABNORMAL LOW (ref >90)
GFR, EST NON AFRICAN AMERICAN: 3 mL/min — AB (ref >90)
Glucose, Bld: 181 mg/dL — ABNORMAL HIGH (ref 70–99)
Potassium: 4.7 mEq/L (ref 3.7–5.3)
SODIUM: 138 meq/L (ref 137–147)
Total Bilirubin: 0.3 mg/dL (ref 0.3–1.2)
Total Protein: 6.6 g/dL (ref 6.0–8.3)

## 2014-06-13 LAB — CBC
HEMATOCRIT: 24.2 % — AB (ref 39.0–52.0)
Hemoglobin: 8.2 g/dL — ABNORMAL LOW (ref 13.0–17.0)
MCH: 29.6 pg (ref 26.0–34.0)
MCHC: 33.9 g/dL (ref 30.0–36.0)
MCV: 87.4 fL (ref 78.0–100.0)
PLATELETS: 228 10*3/uL (ref 150–400)
RBC: 2.77 MIL/uL — AB (ref 4.22–5.81)
RDW: 14.1 % (ref 11.5–15.5)
WBC: 7.4 10*3/uL (ref 4.0–10.5)

## 2014-06-13 LAB — GLUCOSE, CAPILLARY
GLUCOSE-CAPILLARY: 137 mg/dL — AB (ref 70–99)
GLUCOSE-CAPILLARY: 142 mg/dL — AB (ref 70–99)
Glucose-Capillary: 169 mg/dL — ABNORMAL HIGH (ref 70–99)
Glucose-Capillary: 155 mg/dL — ABNORMAL HIGH (ref 70–99)

## 2014-06-13 LAB — PHOSPHORUS: Phosphorus: 11.2 mg/dL — ABNORMAL HIGH (ref 2.3–4.6)

## 2014-06-13 MED ORDER — KETOROLAC TROMETHAMINE 15 MG/ML IJ SOLN
7.5000 mg | Freq: Once | INTRAMUSCULAR | Status: AC
  Administered 2014-06-13: 7.5 mg via INTRAVENOUS
  Filled 2014-06-13: qty 1

## 2014-06-13 MED ORDER — VITAMIN D3 25 MCG (1000 UNIT) PO TABS
4000.0000 [IU] | ORAL_TABLET | Freq: Every day | ORAL | Status: DC
  Administered 2014-06-13 – 2014-06-21 (×9): 4000 [IU] via ORAL
  Filled 2014-06-13 (×9): qty 4

## 2014-06-13 MED ORDER — DARBEPOETIN ALFA-POLYSORBATE 40 MCG/0.4ML IJ SOLN
40.0000 ug | INTRAMUSCULAR | Status: DC
  Administered 2014-06-17: 40 ug via INTRAVENOUS
  Filled 2014-06-13 (×2): qty 0.4

## 2014-06-13 MED ORDER — IBUPROFEN 600 MG PO TABS
600.0000 mg | ORAL_TABLET | Freq: Two times a day (BID) | ORAL | Status: DC
  Filled 2014-06-13 (×2): qty 1

## 2014-06-13 MED ORDER — CALCIUM CARBONATE ANTACID 500 MG PO CHEW
800.0000 mg | CHEWABLE_TABLET | Freq: Three times a day (TID) | ORAL | Status: DC
  Administered 2014-06-13 – 2014-06-21 (×19): 800 mg via ORAL
  Filled 2014-06-13 (×28): qty 4

## 2014-06-13 MED ORDER — DELFLEX-LC/1.5% DEXTROSE 346 MOSM/L IP SOLN: Freq: Once | INTRAPERITONEAL | Status: DC

## 2014-06-13 NOTE — Progress Notes (Signed)
Orthopedic Tech Progress Note Patient Details:  Robert [name] 09/30/1961 [id number] Applied arm sling to RUE. Ortho Devices Type of Ortho Device: Arm sling Ortho Device/Splint Location: RUE Ortho Device/Splint Interventions: Application   Darrol Poke 06/13/2014, 2:15 PM

## 2014-06-13 NOTE — Progress Notes (Signed)
06/13/2014 9:25 PM  Patient due to be drained after dwelling for 3 hours. Drainage should result in 1L. Patient placed now on CAPD and drainage system setup. Within the first twenty minutes the patient did not have any drainage at all. Had the patient roll from side to side, sit up on side of the bed and then stand up. Still no drainage. No complaints of pain or discomfort. Called on call Renal MD Lorrene Reid) and she recommended that the patient be given 30cc of 70% Sorbitol and try to have a bowel movement, if still unsuccessful then cap the patient off. Patient last bowel movement per him was 06/07/14. Sorbitol was given (check eMAR). Will continue to assess and monitor the patient.   [address], [place]  [place]

## 2014-06-13 NOTE — Progress Notes (Signed)
Subjective:  Feels better, with CCPD started last pm, no N/V , tolerated 100% brk, R shoulder pain improved but needed Percocet  Objective Vital signs in last 24 hours: Filed Vitals:   06/12/14 2043 06/12/14 2100 06/13/14 0423 06/13/14 0500  BP:  140/73  167/91  Pulse: 64 58  72  Temp:  98 F (36.7 C)  97.9 F (36.6 C)  TempSrc:  Oral  Oral  Resp:  18  18  Height:      Weight:   137.032 kg (302 lb 1.6 oz)   SpO2: 98% 99%  100%   Weight change:   Physical Exam: General: Alert, Obese adult WM, NAD, Appropriate  Heart: RRR, No mur, gallop or rub Lungs: CTA bilat. Abdomen: Obese, BS pos. , soft NT, PD fluid in  Extremities;Trace bipedal edema   Dialysis Access: PD cath  Problem/Plan:  1. ESRD - Now on SUPINE CCPD started last pm  tolerating, symptomatically better, less acidotic / 2. Anemia - HGB 8.2 start Aranesp qweekly/ fu iron studies 3. Secondary hyperparathyroidism - Phos 11 started binders, and 1st PD yesterday/conitune po vit d (Cholecaciferol) 4. HTN/volume - am bp 167/91on Hydralazine 100mg  bid and bystolic/ using [id number] pd exchanges JUST started last pm / now eating well 5. DM type 2- stable accuck on lantus decr 11 was on 22 ,fu trend and incr insulin as needed 6. R Shoulder Pain ( sp fall, neg plain films)/ DJD- pain meds / Add Kpad and One dose IV Toradol  7. OSA- Using cpap 8. Hypothyroidism - po med  Ernest Haber, PA-C [facility] Beeper [phone] 06/13/2014,8:07 AM  LOS: 1 day   Labs: Basic Metabolic Panel:  Recent Labs Lab 06/12/14 0840 06/12/14 1353 06/13/14 0415  NA 136* 137  --   K 4.7 4.4  --   CL 92* 94*  --   CO2 13* 15*  --   GLUCOSE 134* 60*  --   BUN 146* 144*  --   CREATININE 18.85* 18.70*  --   CALCIUM 6.7* 6.7*  --   PHOS  --   --  11.2*   Liver Function Tests:  Recent Labs Lab 06/12/14 0840 06/12/14 1353  AST 11 10  ALT 12 11  ALKPHOS 57 53  BILITOT 0.3 0.2*  PROT 7.1 6.8  ALBUMIN 3.3* 3.2*    Recent  Labs Lab 06/12/14 0840  LIPASE 95*   CBC:  Recent Labs Lab 06/12/14 0840 06/13/14 0415  WBC 8.8 7.4  NEUTROABS 7.0  --   HGB 8.5* 8.2*  HCT 25.4* 24.2*  MCV 86.4 87.4  PLT 252 228  CBG:  Recent Labs Lab 06/12/14 0830 06/12/14 1306 06/12/14 2100 06/12/14 2133  GLUCAP 137* 67* 60* 70    Studies/Results: Ct Abdomen Pelvis Wo Contrast  06/12/2014   CLINICAL DATA:  Vomiting  EXAM: CT ABDOMEN AND PELVIS WITHOUT CONTRAST  TECHNIQUE: Multidetector CT imaging of the abdomen and pelvis was performed following the standard protocol without IV contrast.  COMPARISON:  05/05/2014  FINDINGS: Bibasilar linear atelectasis  Minimal aortic valve calcification  Calcified granuloma in the liver.  Gallbladder, spleen, pancreas, adrenal glands are within normal limits  Small 17 mm cysts in the left renal fossa is stable  Peritoneal dialysis catheter in the right lower quadrant has been placed.  Stable appearance of the right kidney with a prominent renal pelvis and perinephric stranding. No caliectasis.  Small para-aortic and iliac chain nodes.  Small left inguinal hernia containing adipose tissue.  Diverticulosis of the descending and sigmoid colon.  Appendicolith.  No obvious evidence of acute appendicitis.  Small lipoma in the distal ileum on image 77 is stable.  Umbilical hernia contains fat. Right posterior and lateral abdominal wall hernia containing retroperitoneal fat. This is stable.  Lumbar degenerative change.  Stranding in the left abdominal anterior subcutaneous fat is stable and of unknown significance.  IMPRESSION: No acute disease. Chronic and postoperative changes. Peritoneal dialysis catheter has been placed.   Electronically Signed   By: Maryclare Bean M.D.   On: 06/12/2014 10:10   Dg Chest 2 View  06/12/2014   CLINICAL DATA:  Fall.  Shortness of breath, dizziness and vomiting.  EXAM: CHEST - 2 VIEW  COMPARISON:  05/11/2014  FINDINGS: Stable bilateral pleural thickening and subtle areas of  nodularity in both lungs with suspected calcified granuloma of the left upper lobe. Scattered areas of parenchymal scarring have a stable appearance bilaterally. The heart size is stable and at the upper limits of normal. There is no evidence of pulmonary edema, consolidation, pneumothorax or pleural fluid.  IMPRESSION: Stable chronic pleural thickening and areas of parenchymal nodularity and scarring bilaterally. It was mentioned on the prior chest x-ray report that these changes appear to be progressive by chest x-ray. Recommend eventual correlation, as previously recommended, with CT of the chest.   Electronically Signed   By: Aletta Edouard M.D.   On: 06/12/2014 09:51   Dg Shoulder Right  06/12/2014   CLINICAL DATA:  Fall with right shoulder pain.  EXAM: RIGHT SHOULDER - 2+ VIEW  COMPARISON:  01/28/2014  FINDINGS: No acute fracture or dislocation is identified. Stable degenerative changes are seen involving the [facility] joint as well as the humeral head. No bony lesions or destruction. Soft tissues are unremarkable.  IMPRESSION: No acute fracture or dislocation.  Stable degenerative changes.   Electronically Signed   By: Aletta Edouard M.D.   On: 06/12/2014 09:45   Medications: . dialysis solution for CAPD/CCPD (DELFLEX)     . aspirin EC  81 mg Oral Daily  . atorvastatin  40 mg Oral q1800  . calcium carbonate  400 mg of elemental calcium Oral TID WC  . dialysis solution 1.5% low-MG/low-CA   Intraperitoneal Once in dialysis  . docusate sodium  100 mg Oral QPM  . ezetimibe  10 mg Oral QPM  . hydrALAZINE  100 mg Oral BID  . insulin aspart  0-20 Units Subcutaneous TID WC  . insulin glargine  11 Units Subcutaneous Daily  . levothyroxine  100 mcg Oral QAC breakfast  . nebivolol  15 mg Oral q morning - 10a  . sodium chloride  3 mL Intravenous Q12H  . vitamin E  400 Units Oral q morning - 10a

## 2014-06-13 NOTE — Progress Notes (Signed)
Called by RN that pd cath not draining.  1 liter in, none out.  She tells me he was switched from CCPD tp CAPD because of machine malfunction - but she says was a drainage issue and they could not sort out if machine or catheter issue.  Pt's last PM 5 days ago Give laxatives.  Try again to drain. If unable, cap off and get xray to assure catheter position correct.

## 2014-06-13 NOTE — Progress Notes (Signed)
Feels better from a GI perspective but labs not significantly improved.  Cont PD. SHoulder pain problematic and some swelling. Will use a sling and one dose of toradol, and heat Robert [name]

## 2014-06-14 ENCOUNTER — Inpatient Hospital Stay (HOSPITAL_COMMUNITY): Payer: BC Managed Care – PPO

## 2014-06-14 LAB — CBC
HCT: 26.6 % — ABNORMAL LOW (ref 39.0–52.0)
Hemoglobin: 8.8 g/dL — ABNORMAL LOW (ref 13.0–17.0)
MCH: 29 pg (ref 26.0–34.0)
MCHC: 33.1 g/dL (ref 30.0–36.0)
MCV: 87.8 fL (ref 78.0–100.0)
PLATELETS: 279 10*3/uL (ref 150–400)
RBC: 3.03 MIL/uL — ABNORMAL LOW (ref 4.22–5.81)
RDW: 14.3 % (ref 11.5–15.5)
WBC: 7.6 10*3/uL (ref 4.0–10.5)

## 2014-06-14 LAB — RENAL FUNCTION PANEL
Albumin: 3.1 g/dL — ABNORMAL LOW (ref 3.5–5.2)
Anion gap: 28 — ABNORMAL HIGH (ref 5–15)
BUN: 136 mg/dL — ABNORMAL HIGH (ref 6–23)
CO2: 16 meq/L — ABNORMAL LOW (ref 19–32)
Calcium: 7.4 mg/dL — ABNORMAL LOW (ref 8.4–10.5)
Chloride: 95 meq/L — ABNORMAL LOW (ref 96–112)
Creatinine, Ser: 17.38 mg/dL — ABNORMAL HIGH (ref 0.50–1.35)
GFR calc Af Amer: 3 mL/min — ABNORMAL LOW
GFR calc non Af Amer: 3 mL/min — ABNORMAL LOW
Glucose, Bld: 113 mg/dL — ABNORMAL HIGH (ref 70–99)
Phosphorus: 11.5 mg/dL — ABNORMAL HIGH (ref 2.3–4.6)
Potassium: 4.8 meq/L (ref 3.7–5.3)
Sodium: 139 meq/L (ref 137–147)

## 2014-06-14 LAB — GLUCOSE, CAPILLARY
GLUCOSE-CAPILLARY: 133 mg/dL — AB (ref 70–99)
GLUCOSE-CAPILLARY: 148 mg/dL — AB (ref 70–99)
Glucose-Capillary: 138 mg/dL — ABNORMAL HIGH (ref 70–99)
Glucose-Capillary: 104 mg/dL — ABNORMAL HIGH (ref 70–99)

## 2014-06-14 MED ORDER — KETOROLAC TROMETHAMINE 15 MG/ML IJ SOLN
INTRAMUSCULAR | Status: AC
  Administered 2014-06-14: 7.5 mg
  Filled 2014-06-14: qty 1

## 2014-06-14 MED ORDER — PROMETHAZINE HCL 25 MG/ML IJ SOLN
25.0000 mg | INTRAMUSCULAR | Status: DC | PRN
  Administered 2014-06-14 – 2014-06-17 (×8): 25 mg via INTRAVENOUS
  Filled 2014-06-14 (×8): qty 1

## 2014-06-14 MED ORDER — KETOROLAC TROMETHAMINE 15 MG/ML IJ SOLN
7.5000 mg | Freq: Four times a day (QID) | INTRAMUSCULAR | Status: DC
  Administered 2014-06-14 – 2014-06-19 (×12): 7.5 mg via INTRAVENOUS
  Filled 2014-06-14 (×20): qty 1

## 2014-06-14 MED ORDER — KETOROLAC TROMETHAMINE 15 MG/ML IJ SOLN: 7.5000 mg | Freq: Four times a day (QID) | INTRAMUSCULAR | Status: DC

## 2014-06-14 MED ORDER — SORBITOL 70 % SOLN
30.0000 mL | Freq: Once | Status: AC
  Administered 2014-06-14: 30 mL via ORAL
  Filled 2014-06-14: qty 30

## 2014-06-14 MED ORDER — HEPARIN SODIUM (PORCINE) 1000 UNIT/ML IJ SOLN: 500.0000 [IU] | INTRAMUSCULAR | Status: DC

## 2014-06-14 MED ORDER — PNEUMOCOCCAL VAC POLYVALENT 25 MCG/0.5ML IJ INJ
0.5000 mL | INJECTION | INTRAMUSCULAR | Status: AC
  Administered 2014-06-15: 0.5 mL via INTRAMUSCULAR
  Filled 2014-06-14: qty 0.5

## 2014-06-14 MED ORDER — HEPARIN SODIUM (PORCINE) 1000 UNIT/ML IJ SOLN
1500.0000 [IU] | INTRAMUSCULAR | Status: DC
Start: 2014-06-14 — End: 2014-06-15
  Administered 2014-06-14: 1500 [IU] via INTRAPERITONEAL
  Filled 2014-06-14: qty 1.5

## 2014-06-14 MED ORDER — NEPRO/CARBSTEADY PO LIQD
237.0000 mL | Freq: Two times a day (BID) | ORAL | Status: DC
  Administered 2014-06-14 – 2014-06-21 (×8): 237 mL via ORAL

## 2014-06-14 MED ORDER — DELFLEX-LC/1.5% DEXTROSE 346 MOSM/L IP SOLN
INTRAPERITONEAL | Status: DC
  Administered 2014-06-14: 15:00:00 via INTRAPERITONEAL

## 2014-06-14 NOTE — Progress Notes (Signed)
06/14/2014 11:17 AM  Another attempt made at draining patient.  No fluid was returned, despite repositioning and the patient's moderately sized BM this morning.  Will notify renal and await further orders. Princella Pellegrini

## 2014-06-14 NOTE — Progress Notes (Signed)
INITIAL NUTRITION ASSESSMENT  Pt meets criteria for SEVERE MALNUTRITION in the context of acute illness/injury as evidenced by energy intake of </= 50% for >/= 5 days and a 7% weight loss in one month and from usual body weight.  DOCUMENTATION CODES Per approved criteria  -Severe malnutrition in the context of acute illness or injury -Morbid Obesity   INTERVENTION: -Provide Nepro Shake po BID, each supplement provides 425 kcal and 19 grams protein.  -Encourage PO intake.  NUTRITION DIAGNOSIS: Inadequate oral intake related to nausea as evidenced by pt report and varied meal completion of 0-100%.   Goal: Pt to meet >/= 90% of their estimated nutrition needs   Monitor:  PO intake, supplement acceptance, weight trends, labs, I/O's  Reason for Assessment: MST  53 y.o. male  Admitting Dx: Uremia of renal origin  ASSESSMENT: Pt with ckd secondary to DM in setting of single kidney,now presents with uremic type symptoms of nausea, vomiting, and weakness. He has had progressive nausea ( no food for at least 3 days), bad taste in mouth, vomiting.  Pt on PD. Pt reports his appetite currently is just "ok". Meal completion has varied from 0-100% depending on his state of nausea, however most meals have been 25%. Pt reports over the last week he has been having nausea/vomiting and reports the 5 days prior to admission, he barely ate anything, only a couple of bites of food here and there. Pt reports he has lost weight within the last week due to his very poor po intake. Pt reports his usual body weight of 330 lbs (+/= 6 lbs of fluid weight) where he reported last weighing a little over a week ago. Noted pt with a 7% weight loss within one month and from usual body weight. Pt is willing to try Nepro to help with calorie and protein needs. Will order.  Pt with no sign of fat or muscle mass loss.  Labs: Low chloride, CO2, calcium, GFR. High BUN, creatinine, phosphorous (11.5).  Height: Ht  Readings from Last 1 Encounters:  06/12/14 6' (1.829 m)    Weight: Wt Readings from Last 1 Encounters:  06/14/14 306 lb 12.8 oz (139.164 kg)    Ideal Body Weight: 178 lbs  % Ideal Body Weight: 172%  Wt Readings from Last 10 Encounters:  06/14/14 306 lb 12.8 oz (139.164 kg)  06/09/14 310 lb 3.2 oz (140.706 kg)  05/27/14 327 lb 9.6 oz (148.598 kg)  05/18/14 329 lb (149.233 kg)  05/18/14 329 lb (149.233 kg)  05/11/14 329 lb 8 oz (149.46 kg)  04/26/14 323 lb 12.8 oz (146.875 kg)  04/15/14 320 lb (145.151 kg)  03/31/14 316 lb 12.8 oz (143.7 kg)  03/31/14 313 lb (141.976 kg)    Usual Body Weight: 330 lbs  % Usual Body Weight: 93%  BMI:  Body mass index is 41.6 kg/(m^2). Morbid obesity  Adjusted body weight: 124.5 kg  Estimated Nutritional Needs: Kcal: 2200-2400 Protein: 110-130 grams  Fluid: 1.2 L/day   Skin: +2 LE edema  Diet Order: Diabetic/renal with 1200 ml fluids  EDUCATION NEEDS: -No education needs identified at this time   Intake/Output Summary (Last 24 hours) at 06/14/14 1017 Last data filed at 06/14/14 0900  Gross per 24 hour  Intake     63 ml  Output   1400 ml  Net  -1337 ml    Last BM: 8/31   Labs:   Recent Labs Lab 06/12/14 1353 06/13/14 0415 06/13/14 0846 06/14/14 0530  NA 137  --  138 139  K 4.4  --  4.7 4.8  CL 94*  --  94* 95*  CO2 15*  --  18* 16*  BUN 144*  --  137* 136*  CREATININE 18.70*  --  17.61* 17.38*  CALCIUM 6.7*  --  6.6* 7.4*  PHOS  --  11.2*  --  11.5*  GLUCOSE 60*  --  181* 113*    CBG (last 3)   Recent Labs  06/13/14 1644 06/13/14 2136 06/14/14 0733  GLUCAP 155* 142* 133*    Scheduled Meds: . aspirin EC  81 mg Oral Daily  . atorvastatin  40 mg Oral q1800  . calcium carbonate  800 mg of elemental calcium Oral TID WC  . cholecalciferol  4,000 Units Oral Daily  . darbepoetin (ARANESP) injection - DIALYSIS  40 mcg Intravenous Q Thu-HD  . dialysis solution for CAPD/CCPD [facility])   Peritoneal Dialysis  Once in dialysis  . dialysis solution 1.5% low-MG/low-CA   Intraperitoneal Once in dialysis  . docusate sodium  100 mg Oral QPM  . ezetimibe  10 mg Oral QPM  . hydrALAZINE  100 mg Oral BID  . insulin aspart  0-20 Units Subcutaneous TID WC  . insulin glargine  11 Units Subcutaneous Daily  . levothyroxine  100 mcg Oral QAC breakfast  . nebivolol  15 mg Oral q morning - 10a  . sodium chloride  3 mL Intravenous Q12H  . vitamin E  400 Units Oral q morning - 10a    Continuous Infusions: . dialysis solution for CAPD/CCPD (DELFLEX)      Past Medical History  Diagnosis Date  . Diabetes mellitus without complication   . Hypertension   . Thyroid disease   . Sleep apnea 06/18/11    split-night sleep study- [facility].  . Hyperlipidemia   . PAT (paroxysmal atrial tachycardia)   . PSVT (paroxysmal supraventricular tachycardia)   . Blood transfusion without reported diagnosis   . Chronic kidney disease     Stage IV  . CPAP (continuous positive airway pressure) dependence   . Dysrhythmia   . Shortness of breath   . Hypothyroidism   . GERD (gastroesophageal reflux disease)     n/v a lot  . Headache(784.0)     slight dull h/a due kidney failure  . Cancer     skin ca right shoulder  . Anemia     low hgb at present  . Umbilical hernia     will repair with thi surgery    Past Surgical History  Procedure Laterality Date  . Nephrectomy Left 1974  . Renal biopsy Right 2012  . Skin cancer excision    . Appendectomy  1974  . Capd insertion N/A 05/18/2014    Procedure: LAPAROSCOPIC INSERTION CONTINUOUS AMBULATORY PERITONEAL DIALYSIS  (CAPD) CATHETER;  Surgeon: Ralene Ok, MD;  Location: [place];  Service: General;  Laterality: N/A;  . Umbilical hernia repair N/A 05/18/2014    Procedure: HERNIA REPAIR UMBILICAL ADULT;  Surgeon: Ralene Ok, MD;  Location: [place];  Service: General;  Laterality: N/A;    Kallie Locks, MS, Provisional LDN Pager # [phone] After  hours/ weekend pager # [phone]

## 2014-06-14 NOTE — Progress Notes (Signed)
06/14/2014 5:49 PM  Attempted to drain patient again after heparin dwelled for a couple of hours.  Only a very minimal amount returned (maybe 50cc estimated).  Fluid that was returned was clear and light yellow, no fibrin noted.  Pt tolerated well.  Dr. Jonnie Finner notified.  PD on hold currently, no further orders received at this time.  Updated patient and his wife on plan of care (Renals to re-evaluate plan for PD v HD tomorrow) and answered all questions appropriately.  Pt and wife verbalized understanding.  Will continue to monitor patient. Princella Pellegrini

## 2014-06-14 NOTE — Progress Notes (Signed)
Pt places self on/off cpap.  Rt will monitor. 

## 2014-06-14 NOTE — Progress Notes (Signed)
Subjective:  Frustrated about shoulder pain and PD catheter not working- had sorbitol with result but PD cath still not working- Xray shows PD cath in right place with stool present  Objective Vital signs in last 24 hours: Filed Vitals:   06/13/14 2321 06/14/14 0418 06/14/14 0438 06/14/14 0929  BP:   140/72 144/76  Pulse: 74  71 70  Temp:   98.2 F (36.8 C) 98.1 F (36.7 C)  TempSrc:    Oral  Resp: 18  17 18   Height:      Weight:  139.164 kg (306 lb 12.8 oz)    SpO2:   98% 97%   Weight change: -1.452 kg (-3 lb 3.2 oz)  Intake/Output Summary (Last 24 hours) at 06/14/14 1417 Last data filed at 06/14/14 1100  Gross per 24 hour  Intake    183 ml  Output   1900 ml  Net  -1717 ml    Assessment/ Plan: Pt is [name] 53 y.o. yo male who was admitted on 06/12/2014 with uremia needing initiation of dialysis as well as trauma to right shoulder  Assessment/Plan: 1. New ESRD- PD catheter not functional- X ray confirms that it is in the right place- pt is constipated so will need to have pretty aggressive bowel regimen and I also will instill fluid with heparin to break up any fibrin which could be the issue.  Pt is quite uremic so the sooner we can get his dialysis going the better 2. Shoulder pain- x ray did not show any bony abnormality- pt now reporting some ulnar numbness- I called unassigned for ortho, he told me to assess for ulnar nerve function and if it was functioning that we could continue with sling and pain meds and he could follow up in the office 3. Anemia- significant and predated admission- on aranesp 4. Secondary hyperparathyroidism- high phos, low calc- have started tums- will fine tune when not so constipated 5. HTN/volume- overloaded- should resolve when we are able to do dialysis 6. OSA- needs CPAP 7. DM- reasonable on current regimen  Robert [name]    Labs: Basic Metabolic Panel:  Recent Labs Lab 06/12/14 1353 06/13/14 0415 06/13/14 0846 06/14/14 0530  NA  137  --  138 139  K 4.4  --  4.7 4.8  CL 94*  --  94* 95*  CO2 15*  --  18* 16*  GLUCOSE 60*  --  181* 113*  BUN 144*  --  137* 136*  CREATININE 18.70*  --  17.61* 17.38*  CALCIUM 6.7*  --  6.6* 7.4*  PHOS  --  11.2*  --  11.5*   Liver Function Tests:  Recent Labs Lab 06/12/14 0840 06/12/14 1353 06/13/14 0846 06/14/14 0530  AST 11 10 11   --   ALT 12 11 10   --   ALKPHOS 57 53 52  --   BILITOT 0.3 0.2* 0.3  --   PROT 7.1 6.8 6.6  --   ALBUMIN 3.3* 3.2* 3.0* 3.1*    Recent Labs Lab 06/12/14 0840  LIPASE 95*   No results found for this basename: AMMONIA,  in the last 168 hours CBC:  Recent Labs Lab 06/12/14 0840 06/13/14 0415 06/14/14 0530  WBC 8.8 7.4 7.6  NEUTROABS 7.0  --   --   HGB 8.5* 8.2* 8.8*  HCT 25.4* 24.2* 26.6*  MCV 86.4 87.4 87.8  PLT 252 228 279   Cardiac Enzymes: No results found for this basename: CKTOTAL, CKMB, CKMBINDEX, TROPONINI,  in  the last 168 hours CBG:  Recent Labs Lab 06/13/14 1212 06/13/14 1644 06/13/14 2136 06/14/14 0733 06/14/14 1147  GLUCAP 137* 155* 142* 133* 138*    Iron Studies: No results found for this basename: IRON, TIBC, TRANSFERRIN, FERRITIN,  in the last 72 hours Studies/Results: Dg Abd 1 View  06/14/2014   CLINICAL DATA:  Non functioning peritoneal dialysis catheter. Confirm position.  EXAM: ABDOMEN - 1 VIEW  COMPARISON:  CT abdomen pelvis 06/12/2014.  FINDINGS: Peritoneal dialysis catheter terminates in the right anatomic pelvis, superimposed with the right sacrum. Stool and gas are scattered in the colon. Probable pleural parenchymal scarring at the base of the left hemi thorax.  IMPRESSION: Peritoneal dialysis catheter terminates in the right anatomic pelvis, superimposed with the right sacrum.   Electronically Signed   By: Lorin Picket M.D.   On: 06/14/2014 13:06   Medications: Infusions: . dialysis solution for CAPD/CCPD (DELFLEX)    . dialysis solution 1.5% low-MG/low-CA    . heparin      Scheduled  Medications: . aspirin EC  81 mg Oral Daily  . atorvastatin  40 mg Oral q1800  . calcium carbonate  800 mg of elemental calcium Oral TID WC  . cholecalciferol  4,000 Units Oral Daily  . darbepoetin (ARANESP) injection - DIALYSIS  40 mcg Intravenous Q Thu-HD  . dialysis solution for CAPD/CCPD [facility])   Peritoneal Dialysis Once in dialysis  . dialysis solution 1.5% low-MG/low-CA   Intraperitoneal Once in dialysis  . docusate sodium  100 mg Oral QPM  . ezetimibe  10 mg Oral QPM  . feeding supplement (NEPRO CARB STEADY)  237 mL Oral BID BM  . hydrALAZINE  100 mg Oral BID  . insulin aspart  0-20 Units Subcutaneous TID WC  . insulin glargine  11 Units Subcutaneous Daily  . levothyroxine  100 mcg Oral QAC breakfast  . nebivolol  15 mg Oral q morning - 10a  . sodium chloride  3 mL Intravenous Q12H  . sorbitol  30 mL Oral Once  . vitamin E  400 Units Oral q morning - 10a    have reviewed scheduled and prn medications.  Physical Exam: General: lying in bed, feeling poorly Heart: RRR Lungs: mostly clear Abdomen: obese- distended Extremities: edema Dialysis Access: PD catheter    06/14/2014,2:17 PM  LOS: 2 days

## 2014-06-14 NOTE — ED Provider Notes (Signed)
Medical screening examination/treatment/procedure(s) were conducted as a shared visit with non-physician practitioner(s) and myself.  I personally evaluated the patient during the encounter.   EKG Interpretation None      I interviewed and examined the patient. Lungs are CTAB. Cardiac exam wnl. Abdomen soft.  Pt describes pinching pain in right flank, but not reproducible on my exam. No abd ttp. Do not think this is SBP. Pt well appearing. CT non-contrib. Cr elevated. Will admit to nephrology.   Pamella Pert, MD 06/14/14 1012

## 2014-06-14 NOTE — Progress Notes (Signed)
06/14/2014 2:59 PM  Instilled 1L of 1.5% standard dialysate (with added heparin) into peritoneal cavity.  Pt to sit for a couple of hours then will attempt to drain again.  Sorbitol also given to patient to help alleviate heavy stool burden.  Will monitor. Princella Pellegrini

## 2014-06-15 DIAGNOSIS — E43 Unspecified severe protein-calorie malnutrition: Secondary | ICD-10-CM | POA: Insufficient documentation

## 2014-06-15 LAB — BODY FLUID CELL COUNT WITH DIFFERENTIAL
Eos, Fluid: 0 %
LYMPHS FL: 0 %
MONOCYTE-MACROPHAGE-SEROUS FLUID: 15 % — AB (ref 50–90)
Neutrophil Count, Fluid: 85 % — ABNORMAL HIGH (ref 0–25)
Total Nucleated Cell Count, Fluid: 2476 cu mm — ABNORMAL HIGH (ref 0–1000)

## 2014-06-15 LAB — GLUCOSE, CAPILLARY
GLUCOSE-CAPILLARY: 163 mg/dL — AB (ref 70–99)
Glucose-Capillary: 117 mg/dL — ABNORMAL HIGH (ref 70–99)
Glucose-Capillary: 159 mg/dL — ABNORMAL HIGH (ref 70–99)
Glucose-Capillary: 178 mg/dL — ABNORMAL HIGH (ref 70–99)

## 2014-06-15 MED ORDER — HEPARIN SODIUM (PORCINE) 1000 UNIT/ML IJ SOLN
1500.0000 [IU] | INTRAMUSCULAR | Status: DC
  Filled 2014-06-15: qty 1.5

## 2014-06-15 MED ORDER — KETOROLAC TROMETHAMINE 15 MG/ML IJ SOLN
INTRAMUSCULAR | Status: AC
  Administered 2014-06-15: 15 mg
  Filled 2014-06-15: qty 1

## 2014-06-15 MED ORDER — ALTEPLASE 100 MG IV SOLR
4.0000 mg | INTRAVENOUS | Status: AC
  Administered 2014-06-15: 4 mg
  Filled 2014-06-15 (×2): qty 4

## 2014-06-15 MED ORDER — HEPARIN SODIUM (PORCINE) 1000 UNIT/ML IJ SOLN
1500.0000 [IU] | INTRAMUSCULAR | Status: DC
  Administered 2014-06-15: 1500 [IU] via INTRAPERITONEAL
  Filled 2014-06-15 (×8): qty 1.5

## 2014-06-15 MED ORDER — DELFLEX-LC/2.5% DEXTROSE 394 MOSM/L IP SOLN
INTRAPERITONEAL | Status: DC
  Administered 2014-06-15: 1000 mL via INTRAPERITONEAL
  Administered 2014-06-16 (×2): 5000 mL via INTRAPERITONEAL

## 2014-06-15 NOTE — Progress Notes (Addendum)
06/15/2014 2:18 PM  Dr. Moshe Cipro removed TPA from PD catheter.  500cc flush attempted per Dr. Moshe Cipro at the bedside.  Approximately 500cc of dialysate returned--light pink, clear with a couple of pieces of fibrin noted to the fluid.  Dr. Moshe Cipro observed this as well.  Pt tolerated well.  Orders received for CAPD--one liter instilled this exchange per verbal order from Dr. Moshe Cipro. CAPD to be 2L 2.5% every four hours until re-eval tomorrow.  Heparin ordered from pharmacy for the next exchange--unable to add to this bag now as it is not available from pharmacy as of yet--Dr. Moshe Cipro made aware. Will relay to RN to add to the next bag. Will attempt and monitor. Princella Pellegrini

## 2014-06-15 NOTE — Progress Notes (Signed)
Note/chart reviewed.  Robert [name], RD, LDN Pager #: [phone] After-Hours Pager #: [phone]  

## 2014-06-15 NOTE — Progress Notes (Signed)
Patient and patient's spouse have many concerns relating to patient's PD catheter not working, right shoulder pain continuing, and lab work not being drawn this morning. Dr. Moshe Cipro notified of the above issues. Stated she would address them when she does rounds. Patient and patient's spouse informed.   Joellen Jersey, RN.

## 2014-06-15 NOTE — Progress Notes (Signed)
I have seen and examined the patient and agree with the assessment and plans.  Nicci Vaughan A. Berdina Cheever  MD, FACS  

## 2014-06-15 NOTE — Progress Notes (Signed)
Subjective:  Frustrated still about shoulder pain and PD catheter not working- had sorbitol with result and heparin instilled fluid but PD cath still not working- Xray shows PD cath in right place with stool present  Objective Vital signs in last 24 hours: Filed Vitals:   06/14/14 0929 06/14/14 1839 06/14/14 2117 06/15/14 0518  BP: 144/76 127/68 119/44 155/72  Pulse: 70 74 73 78  Temp: 98.1 F (36.7 C) 98.5 F (36.9 C) 98.1 F (36.7 C) 98.3 F (36.8 C)  TempSrc: Oral Oral Oral Oral  Resp: 18 19 19 18   Height:      Weight:      SpO2: 97% 98% 97% 97%   Weight change:   Intake/Output Summary (Last 24 hours) at 06/15/14 1049 Last data filed at 06/15/14 0830  Gross per 24 hour  Intake    360 ml  Output   1250 ml  Net   -890 ml    Assessment/ Plan: Pt is Robert [name] 53 y.o. yo male who was admitted on 06/12/2014 with uremia needing initiation of dialysis as well as trauma to right shoulder  Assessment/Plan: 1. New ESRD- PD catheter not functional- X ray confirms that it is in the right place- pt is constipated so will continue pretty aggressive bowel regimen and have instilled 4 mg of TPA via PD cath this AM. If that fails, I have surgery as back up to manipulate the catheter tomorrow in the OR.  Pt is quite uremic so the sooner we can get his dialysis going the better.  He was supposed to start training this week 2. Shoulder pain- x ray did not show any bony abnormality- pt now reporting some ulnar numbness- I called unassigned ortho to see him 3. Anemia- significant and predated admission- on aranesp- will check iron stores 4. Secondary hyperparathyroidism- high phos, low calc- have started tums- will fine tune when not so constipated 5. HTN/volume- overloaded- should resolve when we are able to do dialysis 6. OSA- needs CPAP 7. DM- reasonable on current regimen  Robert Robert [name]    Labs: Basic Metabolic Panel:  Recent Labs Lab 06/12/14 1353 06/13/14 0415 06/13/14 0846  06/14/14 0530  NA 137  --  138 139  K 4.4  --  4.7 4.8  CL 94*  --  94* 95*  CO2 15*  --  18* 16*  GLUCOSE 60*  --  181* 113*  BUN 144*  --  137* 136*  CREATININE 18.70*  --  17.61* 17.38*  CALCIUM 6.7*  --  6.6* 7.4*  PHOS  --  11.2*  --  11.5*   Liver Function Tests:  Recent Labs Lab 06/12/14 0840 06/12/14 1353 06/13/14 0846 06/14/14 0530  AST 11 10 11   --   ALT 12 11 10   --   ALKPHOS 57 53 52  --   BILITOT 0.3 0.2* 0.3  --   PROT 7.1 6.8 6.6  --   ALBUMIN 3.3* 3.2* 3.0* 3.1*    Recent Labs Lab 06/12/14 0840  LIPASE 95*   No results found for this basename: AMMONIA,  in the last 168 hours CBC:  Recent Labs Lab 06/12/14 0840 06/13/14 0415 06/14/14 0530  WBC 8.8 7.4 7.6  NEUTROABS 7.0  --   --   HGB 8.5* 8.2* 8.8*  HCT 25.4* 24.2* 26.6*  MCV 86.4 87.4 87.8  PLT 252 228 279   Cardiac Enzymes: No results found for this basename: CKTOTAL, CKMB, CKMBINDEX, TROPONINI,  in the last 168 hours CBG:  Recent Labs Lab 06/14/14 0733 06/14/14 1147 06/14/14 1643 06/14/14 2116 06/15/14 0745  GLUCAP 133* 138* 104* 148* 117*    Iron Studies: No results found for this basename: IRON, TIBC, TRANSFERRIN, FERRITIN,  in the last 72 hours Studies/Results: Dg Abd 1 View  06/14/2014   CLINICAL DATA:  Non functioning peritoneal dialysis catheter. Confirm position.  EXAM: ABDOMEN - 1 VIEW  COMPARISON:  CT abdomen pelvis 06/12/2014.  FINDINGS: Peritoneal dialysis catheter terminates in the right anatomic pelvis, superimposed with the right sacrum. Stool and gas are scattered in the colon. Probable pleural parenchymal scarring at the base of the left hemi thorax.  IMPRESSION: Peritoneal dialysis catheter terminates in the right anatomic pelvis, superimposed with the right sacrum.   Electronically Signed   By: Lorin Picket M.D.   On: 06/14/2014 13:06   Medications: Infusions: . dialysis solution for CAPD/CCPD (DELFLEX)    . dialysis solution 1.5% low-MG/low-CA    . heparin       Scheduled Medications: . aspirin EC  81 mg Oral Daily  . atorvastatin  40 mg Oral q1800  . calcium carbonate  800 mg of elemental calcium Oral TID WC  . cholecalciferol  4,000 Units Oral Daily  . darbepoetin (ARANESP) injection - DIALYSIS  40 mcg Intravenous Q Thu-HD  . dialysis solution for CAPD/CCPD [facility])   Peritoneal Dialysis Once in dialysis  . dialysis solution 1.5% low-MG/low-CA   Intraperitoneal Once in dialysis  . docusate sodium  100 mg Oral QPM  . ezetimibe  10 mg Oral QPM  . feeding supplement (NEPRO CARB STEADY)  237 mL Oral BID BM  . hydrALAZINE  100 mg Oral BID  . insulin aspart  0-20 Units Subcutaneous TID WC  . insulin glargine  11 Units Subcutaneous Daily  . ketorolac  7.5 mg Intravenous 4 times per day  . levothyroxine  100 mcg Oral QAC breakfast  . nebivolol  15 mg Oral q morning - 10a  . pneumococcal 23 valent vaccine  0.5 mL Intramuscular Tomorrow-1000  . sodium chloride  3 mL Intravenous Q12H  . vitamin E  400 Units Oral q morning - 10a    have reviewed scheduled and prn medications.  Physical Exam: General: lying in bed, feeling poorly Heart: RRR Lungs: mostly clear Abdomen: obese- distended Extremities: edema Dialysis Access: PD catheter    06/15/2014,10:49 AM  LOS: 3 days

## 2014-06-15 NOTE — Progress Notes (Signed)
Patient ID: Robert [name], male   DOB: 1960/11/24, 53 y.o.   MRN: [id number]    Subjective: Pt is known to Dr. Rosendo Gros as he placed a CAPD about 4 weeks ago on 05-18-14.  He has been doing well until this past weekend when he has had issues with draining.  Heparin was put in his fluid and run in, but unable to get more than 200cc back.  He is positive around 2L in.  They are going to try TPA today to see if that will help.  If not, they have asked [place] to see the patient to get him set up for repositioning of his catheter.  The patient is complaining of nausea.  Objective: Vital signs in last 24 hours: Temp:  [98.1 F (36.7 C)-98.5 F (36.9 C)] 98.3 F (36.8 C) (09/08 0518) Pulse Rate:  [73-78] 78 (09/08 0518) Resp:  [18-19] 18 (09/08 0518) BP: (119-155)/(44-72) 155/72 mmHg (09/08 0518) SpO2:  [97 %-98 %] 97 % (09/08 0518) Last BM Date: 06/14/14  Intake/Output from previous day: 09/07 0701 - 09/08 0700 In: 360 [P.O.:360] Out: 1350 [Urine:1350] Intake/Output this shift: Total I/O In: -  Out: 200 [Urine:200]  PE: Gen: NAD Heart: regular, rate, and rhythm Lungs: CTAB Abd: soft, minimal tenderness in the lower abdomen, small umbilical hernia, +BS, CAPD in R mid quadrant.   Lab Results:   Recent Labs  06/13/14 0415 06/14/14 0530  WBC 7.4 7.6  HGB 8.2* 8.8*  HCT 24.2* 26.6*  PLT 228 279   BMET  Recent Labs  06/13/14 0846 06/14/14 0530  NA 138 139  K 4.7 4.8  CL 94* 95*  CO2 18* 16*  GLUCOSE 181* 113*  BUN 137* 136*  CREATININE 17.61* 17.38*  CALCIUM 6.6* 7.4*   PT/INR No results found for this basename: LABPROT, INR,  in the last 72 hours CMP     Component Value Date/Time   NA 139 06/14/2014 0530   K 4.8 06/14/2014 0530   CL 95* 06/14/2014 0530   CO2 16* 06/14/2014 0530   GLUCOSE 113* 06/14/2014 0530   BUN 136* 06/14/2014 0530   CREATININE 17.38* 06/14/2014 0530   CALCIUM 7.4* 06/14/2014 0530   CALCIUM 8.5 04/07/2014 1057   PROT 6.6 06/13/2014 0846   ALBUMIN 3.1*  06/14/2014 0530   AST 11 06/13/2014 0846   ALT 10 06/13/2014 0846   ALKPHOS 52 06/13/2014 0846   BILITOT 0.3 06/13/2014 0846   GFRNONAA 3* 06/14/2014 0530   GFRAA 3* 06/14/2014 0530   Lipase     Component Value Date/Time   LIPASE 95* 06/12/2014 0840       Studies/Results: Dg Abd 1 View  06/14/2014   CLINICAL DATA:  Non functioning peritoneal dialysis catheter. Confirm position.  EXAM: ABDOMEN - 1 VIEW  COMPARISON:  CT abdomen pelvis 06/12/2014.  FINDINGS: Peritoneal dialysis catheter terminates in the right anatomic pelvis, superimposed with the right sacrum. Stool and gas are scattered in the colon. Probable pleural parenchymal scarring at the base of the left hemi thorax.  IMPRESSION: Peritoneal dialysis catheter terminates in the right anatomic pelvis, superimposed with the right sacrum.   Electronically Signed   By: Lorin Picket M.D.   On: 06/14/2014 13:06    Anti-infectives: Anti-infectives   None       Assessment/Plan  1. ESRD on CAPD 2. Uremia secondary to malfunctioning CAPD 3. Anemia, 8.8 4. HTN 5. DM 6. OSA 7. Secondary hyperparathyroidism  Plan: 1. Dr. Moshe Cipro is going to try  to TPA his catheter today. If this fails, we will make the patient NPO after MN and plan tentatively for repositioning tomorrow in the OR with Dr. Rosendo Gros.  If we are unable to do it tomorrow, they next available time is Thursday.  We will follow along.    LOS: 3 days    Shaquana Buel E 06/15/2014, 10:01 AM Pager: [phone]

## 2014-06-15 NOTE — Consult Note (Signed)
ORTHOPAEDIC CONSULTATION  REQUESTING PHYSICIAN: Estanislado Emms, MD  Chief Complaint: R shoulder injury  HPI: Robert [name] is a 53 y.o. male who complains of R ant/sup shoulder pain after a fall into the table with the point or his R shoulder. This occurred when he was hypotensive and had a syncopal episode. He has been in a sling since Sunday/monday and has developed numbness in his Ulnar nerve distribution.   Past Medical History  Diagnosis Date  . Diabetes mellitus without complication   . Hypertension   . Thyroid disease   . Sleep apnea 06/18/11    split-night sleep study- [facility].  . Hyperlipidemia   . PAT (paroxysmal atrial tachycardia)   . PSVT (paroxysmal supraventricular tachycardia)   . Blood transfusion without reported diagnosis   . Chronic kidney disease     Stage IV  . CPAP (continuous positive airway pressure) dependence   . Dysrhythmia   . Shortness of breath   . Hypothyroidism   . GERD (gastroesophageal reflux disease)     n/v a lot  . Headache(784.0)     slight dull h/a due kidney failure  . Cancer     skin ca right shoulder  . Anemia     low hgb at present  . Umbilical hernia     will repair with thi surgery   Past Surgical History  Procedure Laterality Date  . Nephrectomy Left 1974  . Renal biopsy Right 2012  . Skin cancer excision    . Appendectomy  1974  . Capd insertion N/A 05/18/2014    Procedure: LAPAROSCOPIC INSERTION CONTINUOUS AMBULATORY PERITONEAL DIALYSIS  (CAPD) CATHETER;  Surgeon: Ralene Ok, MD;  Location: [place];  Service: General;  Laterality: N/A;  . Umbilical hernia repair N/A 05/18/2014    Procedure: HERNIA REPAIR UMBILICAL ADULT;  Surgeon: Ralene Ok, MD;  Location: [place];  Service: General;  Laterality: N/A;   History   Social History  . Marital Status: Married    Spouse Name: N/A    Number of Children: N/A  . Years of Education: N/A   Social History Main Topics  . Smoking  status: Former Smoker    Types: Pipe    Quit date: 12/06/2013  . Smokeless tobacco: Never Used  . Alcohol Use: No     Comment: no alcohol since March per pt.  . Drug Use: No  . Sexual Activity: None   Other Topics Concern  . None   Social History Narrative  . None   Family History  Problem Relation Age of Onset  . Colon polyps Father   . Colon cancer Neg Hx    No Known Allergies Prior to Admission medications   Medication Sig Start Date End Date Taking? Authorizing Provider  aspirin 81 MG tablet Take 81 mg by mouth every morning.    Yes Historical Provider, MD  BYSTOLIC 10 MG tablet Take 15 mg by mouth every morning.  08/10/13  Yes Historical Provider, MD  cetirizine (ZYRTEC) 10 MG tablet Take 10 mg by mouth every morning.    Yes Historical Provider, MD  Cholecalciferol (VITAMIN D) 2000 UNITS tablet Take 4,000 Units by mouth every morning.    Yes Historical Provider, MD  Coenzyme Q-10 100 MG capsule Take 100 mg by mouth every morning.   Yes Historical Provider, MD  docusate sodium (COLACE) 100 MG capsule Take 100 mg by mouth every evening.    Yes Historical Provider, MD  ezetimibe (  ZETIA) 10 MG tablet Take 10 mg by mouth every evening.    Yes Historical Provider, MD  fluocinonide cream (LIDEX) 0.05 % Apply 1 application topically daily as needed (Skin inflammation (hand, thigh, back)).    Yes Historical Provider, MD  Hyaluronic Acid-Vitamin C (HYALURONIC ACID PO) Take 20 mg by mouth every morning.   Yes Historical Provider, MD  hydrALAZINE (APRESOLINE) 50 MG tablet Take 100 mg by mouth 2 (two) times daily.  08/10/13  Yes Historical Provider, MD  Insulin Glargine (LANTUS SOLOSTAR) 100 UNIT/ML Solostar Pen Inject 22 Units into the skin daily.    Yes Historical Provider, MD  insulin regular human CONCENTRATED (HUMULIN R) 500 UNIT/ML SOLN injection Inject 5 Units into the skin 3 (three) times daily with meals.   Yes Historical Provider, MD  lactulose, encephalopathy, (CHRONULAC) 10  GM/15ML SOLN Take 20-40 g by mouth daily as needed (Laxative).  05/24/14  Yes Historical Provider, MD  levothyroxine (SYNTHROID, LEVOTHROID) 100 MCG tablet Take 1 tablet (100 mcg total) by mouth daily. 01/05/14  Yes Ajay Kumar, MD  Liraglutide (VICTOZA) 18 MG/3ML SOPN Inject 1.2 mg into the skin daily. 01/04/14  Yes Ajay Kumar, MD  rosuvastatin (CRESTOR) 20 MG tablet Take 1 tablet (20 mg total) by mouth daily. 01/05/14  Yes Ajay Kumar, MD  senna (SENOKOT) 8.6 MG tablet Take 1 tablet by mouth every evening.    Yes Historical Provider, MD  vitamin E 400 UNIT capsule Take 400 Units by mouth every morning.    Yes Historical Provider, MD  gentamicin cream (GARAMYCIN) 0.1 % Apply 1 application topically once a week. Apply weekly to peritoneal catheter 06/02/14   Historical Provider, MD  Insulin Pen Needle 32G X 4 MM MISC Use 4 pen needles per day to inject insulin 01/05/14   Ajay Kumar, MD  NON FORMULARY every evening. CPAP therapy.    Historical Provider, MD   Dg Abd 1 View  06/14/2014   CLINICAL DATA:  Non functioning peritoneal dialysis catheter. Confirm position.  EXAM: ABDOMEN - 1 VIEW  COMPARISON:  CT abdomen pelvis 06/12/2014.  FINDINGS: Peritoneal dialysis catheter terminates in the right anatomic pelvis, superimposed with the right sacrum. Stool and gas are scattered in the colon. Probable pleural parenchymal scarring at the base of the left hemi thorax.  IMPRESSION: Peritoneal dialysis catheter terminates in the right anatomic pelvis, superimposed with the right sacrum.   Electronically Signed   By: Melinda  Blietz M.D.   On: 06/14/2014 13:06    Positive ROS: All other systems have been reviewed and were otherwise negative with the exception of those mentioned in the HPI and as above.  Labs cbc  Recent Labs  06/13/14 0415 06/14/14 0530  WBC 7.4 7.6  HGB 8.2* 8.8*  HCT 24.2* 26.6*  PLT 228 279    Labs inflam No results found for this basename: ESR, CRP,  in the last 72 hours  Labs coag No  results found for this basename: INR, PT, PTT,  in the last 72 hours   Recent Labs  06/13/14 0846 06/14/14 0530  NA 138 139  K 4.7 4.8  CL 94* 95*  CO2 18* 16*  GLUCOSE 181* 113*  BUN 137* 136*  CREATININE 17.61* 17.38*  CALCIUM 6.6* 7.4*    Physical Exam: Filed Vitals:   06/15/14 0518  BP: 155/72  Pulse: 78  Temp: 98.3 F (36.8 C)  Resp: 18   General: Alert, no acute distress Cardiovascular: No pedal edema Respiratory: No cyanosis, no use   of accessory musculature GI: No organomegaly, abdomen is soft and non-tender Skin: No lesions in the area of chief complaint other than those listed below in MSK exam.  Neurologic: Sensation intact distally Psychiatric: Patient is competent for consent with normal mood and affect Lymphatic: No axillary or cervical lymphadenopathy  MUSCULOSKELETAL:  RUE: TTP most prominent at his [facility] joint and bursal area. +hawkins, limited Cross body motion and FE limited actively to 50 degrees. Passively he can get to 150.  Distally he has sensation in Ulnar nerve but it is significantly diminished. O/w SILT R/U nerves. 2+Rad pulse. +EPL/FPL/IO  Other extremities are atraumatic with painless ROM and NVI.  Assessment: Likely AC sprain and bursitis vs. Rotator cuff injury.  New ulnar nerve symptoms  Plan: Will perform SA cortisone injection for now and f/u as an outpatient to assess need for an MRI in light of his recovery and surgical risk/gain profile.  I think his ulner neuropathy is likely due to the sling use and elbow flexion I have asked him to remove the sling when possible when in bed.   PT: ROM and shoulder strengthen as tolerated.  Sling for comfort.   Pls f/u as an outpatient in 2-4wks I will perform injection on 9/9, pls call with any further questions after this.    Edmonia Lynch, D, MD Cell [phone]   06/15/2014 11:07 AM

## 2014-06-16 ENCOUNTER — Encounter (HOSPITAL_COMMUNITY): Payer: BC Managed Care – PPO | Admitting: Anesthesiology

## 2014-06-16 ENCOUNTER — Encounter (HOSPITAL_COMMUNITY)
Admission: EM | Disposition: A | Discharge status: Home / Self Care | Payer: Self-pay | Source: Home / Self Care | Attending: Nephrology

## 2014-06-16 ENCOUNTER — Inpatient Hospital Stay (HOSPITAL_COMMUNITY): Payer: BC Managed Care – PPO | Admitting: Anesthesiology

## 2014-06-16 HISTORY — PX: LAPAROSCOPIC REPOSITIONING CAPD CATHETER: SHX5920

## 2014-06-16 LAB — RENAL FUNCTION PANEL
Albumin: 3 g/dL — ABNORMAL LOW (ref 3.5–5.2)
Anion gap: 25 — ABNORMAL HIGH (ref 5–15)
BUN: 137 mg/dL — AB (ref 6–23)
CALCIUM: 8 mg/dL — AB (ref 8.4–10.5)
CO2: 18 mEq/L — ABNORMAL LOW (ref 19–32)
Chloride: 96 mEq/L (ref 96–112)
Creatinine, Ser: 17.49 mg/dL — ABNORMAL HIGH (ref 0.50–1.35)
GFR calc Af Amer: 3 mL/min — ABNORMAL LOW (ref >90)
GFR calc non Af Amer: 3 mL/min — ABNORMAL LOW (ref >90)
GLUCOSE: 149 mg/dL — AB (ref 70–99)
PHOSPHORUS: 10.3 mg/dL — AB (ref 2.3–4.6)
Potassium: 4.6 mEq/L (ref 3.7–5.3)
SODIUM: 139 meq/L (ref 137–147)

## 2014-06-16 LAB — IRON AND TIBC
Iron: 63 ug/dL (ref 42–135)
SATURATION RATIOS: 34 % (ref 20–55)
TIBC: 186 ug/dL — ABNORMAL LOW (ref 215–435)
UIBC: 123 ug/dL — AB (ref 125–400)

## 2014-06-16 LAB — GLUCOSE, CAPILLARY
GLUCOSE-CAPILLARY: 133 mg/dL — AB (ref 70–99)
Glucose-Capillary: 143 mg/dL — ABNORMAL HIGH (ref 70–99)
Glucose-Capillary: 126 mg/dL — ABNORMAL HIGH (ref 70–99)

## 2014-06-16 LAB — FERRITIN: Ferritin: 611 ng/mL — ABNORMAL HIGH (ref 22–322)

## 2014-06-16 LAB — PATHOLOGIST SMEAR REVIEW

## 2014-06-16 SURGERY — REPOSITIONING, CATHETER, CAPD, LAPAROSCOPIC
Anesthesia: General | Site: Abdomen

## 2014-06-16 MED ORDER — PROPOFOL 10 MG/ML IV BOLUS
INTRAVENOUS | Status: DC | PRN
  Administered 2014-06-16: 150 mg via INTRAVENOUS

## 2014-06-16 MED ORDER — CEFTAZIDIME 200 MG/ML FOR DIALYSIS: 1000.0000 mg | INJECTION | Freq: Once | INTRAVENOUS_CENTRAL | Status: DC

## 2014-06-16 MED ORDER — ARTIFICIAL TEARS OP OINT
TOPICAL_OINTMENT | OPHTHALMIC | Status: DC | PRN
  Administered 2014-06-16: 1 via OPHTHALMIC

## 2014-06-16 MED ORDER — FENTANYL CITRATE 0.05 MG/ML IJ SOLN
INTRAMUSCULAR | Status: AC
  Filled 2014-06-16: qty 5

## 2014-06-16 MED ORDER — PROPOFOL 10 MG/ML IV BOLUS
INTRAVENOUS | Status: AC
  Filled 2014-06-16: qty 20

## 2014-06-16 MED ORDER — OXYCODONE HCL 5 MG/5ML PO SOLN: 5.0000 mg | Freq: Once | ORAL | Status: DC | PRN

## 2014-06-16 MED ORDER — NEOSTIGMINE METHYLSULFATE 10 MG/10ML IV SOLN
INTRAVENOUS | Status: DC | PRN
  Administered 2014-06-16: 5 mg via INTRAVENOUS

## 2014-06-16 MED ORDER — ROCURONIUM BROMIDE 100 MG/10ML IV SOLN
INTRAVENOUS | Status: DC | PRN
  Administered 2014-06-16: 50 mg via INTRAVENOUS

## 2014-06-16 MED ORDER — MIDAZOLAM HCL 2 MG/2ML IJ SOLN
INTRAMUSCULAR | Status: AC
  Filled 2014-06-16: qty 2

## 2014-06-16 MED ORDER — DELFLEX-LM/2.5% DEXTROSE 396 MOSM/L IP SOLN
2000.0000 mL | INTRAPERITONEAL | Status: DC
  Administered 2014-06-17: 2000 mL via INTRAPERITONEAL

## 2014-06-16 MED ORDER — CEFTAZIDIME 200 MG/ML FOR DIALYSIS: 2000.0000 mg | INJECTION | Freq: Once | INTRAVENOUS_CENTRAL | Status: DC

## 2014-06-16 MED ORDER — FENTANYL CITRATE 0.05 MG/ML IJ SOLN: 25.0000 ug | INTRAMUSCULAR | Status: DC | PRN

## 2014-06-16 MED ORDER — OXYCODONE HCL 5 MG PO TABS: 5.0000 mg | ORAL_TABLET | Freq: Once | ORAL | Status: DC | PRN

## 2014-06-16 MED ORDER — LIDOCAINE HCL (CARDIAC) 20 MG/ML IV SOLN
INTRAVENOUS | Status: AC
  Filled 2014-06-16: qty 10

## 2014-06-16 MED ORDER — LIDOCAINE HCL (CARDIAC) 20 MG/ML IV SOLN
INTRAVENOUS | Status: DC | PRN
  Administered 2014-06-16: 80 mg via INTRAVENOUS

## 2014-06-16 MED ORDER — CEFAZOLIN SODIUM-DEXTROSE 2-3 GM-% IV SOLR
2.0000 g | INTRAVENOUS | Status: AC
  Administered 2014-06-16: 2 g via INTRAVENOUS
  Filled 2014-06-16 (×2): qty 50

## 2014-06-16 MED ORDER — SODIUM CHLORIDE 0.9 % IV SOLN
INTRAVENOUS | Status: DC
Start: 2014-06-16 — End: 2014-06-21
  Administered 2014-06-16: 12:00:00 via INTRAVENOUS

## 2014-06-16 MED ORDER — BUPIVACAINE HCL (PF) 0.25 % IJ SOLN
INTRAMUSCULAR | Status: AC
  Filled 2014-06-16: qty 30

## 2014-06-16 MED ORDER — VANCOMYCIN 100 MG/ML FOR DIALYSIS
1500.0000 mg | INJECTION | Freq: Once | Status: AC
  Administered 2014-06-16: 1500 mg via INTRAPERITONEAL
  Filled 2014-06-16: qty 15

## 2014-06-16 MED ORDER — 0.9 % SODIUM CHLORIDE (POUR BTL) OPTIME
TOPICAL | Status: DC | PRN
  Administered 2014-06-16: 1000 mL

## 2014-06-16 MED ORDER — SODIUM CHLORIDE 0.9 % IR SOLN
Status: DC | PRN
  Administered 2014-06-16: 1

## 2014-06-16 MED ORDER — BUPIVACAINE HCL 0.25 % IJ SOLN
INTRAMUSCULAR | Status: DC | PRN
  Administered 2014-06-16: 1 mL

## 2014-06-16 MED ORDER — EPHEDRINE SULFATE 50 MG/ML IJ SOLN
INTRAMUSCULAR | Status: DC | PRN
  Administered 2014-06-16: 10 mg via INTRAVENOUS

## 2014-06-16 MED ORDER — GLYCOPYRROLATE 0.2 MG/ML IJ SOLN
INTRAMUSCULAR | Status: DC | PRN
  Administered 2014-06-16: 1 mg via INTRAVENOUS

## 2014-06-16 MED ORDER — ONDANSETRON HCL 4 MG/2ML IJ SOLN
INTRAMUSCULAR | Status: DC | PRN
  Administered 2014-06-16: 4 mg via INTRAVENOUS

## 2014-06-16 MED ORDER — DELFLEX-LC/2.5% DEXTROSE 394 MOSM/L IP SOLN: INTRAPERITONEAL | Status: DC

## 2014-06-16 MED ORDER — FENTANYL CITRATE 0.05 MG/ML IJ SOLN
INTRAMUSCULAR | Status: DC | PRN
  Administered 2014-06-16: 50 ug via INTRAVENOUS

## 2014-06-16 MED ORDER — SUCCINYLCHOLINE CHLORIDE 20 MG/ML IJ SOLN
INTRAMUSCULAR | Status: DC | PRN
  Administered 2014-06-16: 80 mg via INTRAVENOUS

## 2014-06-16 MED ORDER — OXYCODONE-ACETAMINOPHEN 5-325 MG PO TABS
1.0000 | ORAL_TABLET | ORAL | Status: DC | PRN
  Administered 2014-06-17 – 2014-06-18 (×2): 2 via ORAL
  Filled 2014-06-16 (×2): qty 2

## 2014-06-16 MED ORDER — CEFTAZIDIME 200 MG/ML FOR DIALYSIS
1500.0000 mg | INJECTION | Freq: Every day | INTRAVENOUS_CENTRAL | Status: DC
  Filled 2014-06-16: qty 7.5

## 2014-06-16 MED ORDER — HEPARIN SODIUM (PORCINE) 1000 UNIT/ML IJ SOLN
1500.0000 [IU] | INTRAMUSCULAR | Status: DC
  Filled 2014-06-16 (×3): qty 1.5

## 2014-06-16 MED ORDER — HEPARIN SODIUM (PORCINE) 1000 UNIT/ML IJ SOLN
2500.0000 [IU] | INTRAMUSCULAR | Status: DC | PRN
  Administered 2014-06-16 (×2): 2500 [IU] via INTRAPERITONEAL
  Filled 2014-06-16 (×3): qty 2.5

## 2014-06-16 MED ORDER — ONDANSETRON HCL 4 MG/2ML IJ SOLN
INTRAMUSCULAR | Status: AC
  Filled 2014-06-16: qty 2

## 2014-06-16 MED ORDER — CEFTAZIDIME 200 MG/ML FOR DIALYSIS
2000.0000 mg | INJECTION | Freq: Once | INTRAVENOUS_CENTRAL | Status: AC
  Administered 2014-06-16: 2000 mg via INTRAPERITONEAL
  Filled 2014-06-16: qty 10

## 2014-06-16 SURGICAL SUPPLY — 38 items
ADAPTER TITANIUM MEDIONICS (MISCELLANEOUS) IMPLANT
ADPR DLYS CATH STRL LF DISP (MISCELLANEOUS)
APL SKNCLS STERI-STRIP NONHPOA (GAUZE/BANDAGES/DRESSINGS) ×1
BENZOIN TINCTURE PRP APPL 2/3 (GAUZE/BANDAGES/DRESSINGS) ×2 IMPLANT
BLADE SURG ROTATE 9660 (MISCELLANEOUS) ×2 IMPLANT
CANISTER SUCTION 2500CC (MISCELLANEOUS) ×2 IMPLANT
CATH EXTENDED DIALYSIS (CATHETERS) IMPLANT
CHLORAPREP W/TINT 26ML (MISCELLANEOUS) ×2 IMPLANT
CLOSURE WOUND 1/2 X4 (GAUZE/BANDAGES/DRESSINGS) ×1
COVER SURGICAL LIGHT HANDLE (MISCELLANEOUS) ×2 IMPLANT
DRAPE UTILITY 15X26 W/TAPE STR (DRAPE) ×4 IMPLANT
ELECT REM PT RETURN 9FT ADLT (ELECTROSURGICAL) ×3
ELECTRODE REM PT RTRN 9FT ADLT (ELECTROSURGICAL) IMPLANT
GAUZE SPONGE 2X2 8PLY STRL LF (GAUZE/BANDAGES/DRESSINGS) IMPLANT
GLOVE BIO SURGEON STRL SZ 6.5 (GLOVE) ×1 IMPLANT
GLOVE BIO SURGEON STRL SZ7.5 (GLOVE) ×2 IMPLANT
GLOVE BIO SURGEONS STRL SZ 6.5 (GLOVE) ×1
GLOVE BIOGEL PI IND STRL 7.0 (GLOVE) IMPLANT
GLOVE BIOGEL PI INDICATOR 7.0 (GLOVE) ×4
GOWN STRL REUS W/ TWL LRG LVL3 (GOWN DISPOSABLE) IMPLANT
GOWN STRL REUS W/ TWL XL LVL3 (GOWN DISPOSABLE) IMPLANT
GOWN STRL REUS W/TWL LRG LVL3 (GOWN DISPOSABLE) ×6
GOWN STRL REUS W/TWL XL LVL3 (GOWN DISPOSABLE) ×3
KIT BASIN OR (CUSTOM PROCEDURE TRAY) ×2 IMPLANT
KIT ROOM TURNOVER OR (KITS) ×2 IMPLANT
NDL INSUFFLATION 14GA 120MM (NEEDLE) IMPLANT
NEEDLE INSUFFLATION 14GA 120MM (NEEDLE) ×3 IMPLANT
NS IRRIG 1000ML POUR BTL (IV SOLUTION) ×2 IMPLANT
PAD ARMBOARD 7.5X6 YLW CONV (MISCELLANEOUS) ×2 IMPLANT
SET CYSTO W/LG BORE CLAMP LF (SET/KITS/TRAYS/PACK) ×2 IMPLANT
SLEEVE ENDOPATH XCEL 5M (ENDOMECHANICALS) ×2 IMPLANT
SPONGE GAUZE 2X2 STER 10/PKG (GAUZE/BANDAGES/DRESSINGS) ×4
SPONGE GAUZE 4X4 12PLY STER LF (GAUZE/BANDAGES/DRESSINGS) ×2 IMPLANT
STRIP CLOSURE SKIN 1/2X4 (GAUZE/BANDAGES/DRESSINGS) ×1 IMPLANT
SUT MNCRL AB 4-0 PS2 18 (SUTURE) ×2 IMPLANT
TOWEL OR 17X26 10 PK STRL BLUE (TOWEL DISPOSABLE) ×2 IMPLANT
TRAY LAPAROSCOPIC (CUSTOM PROCEDURE TRAY) ×2 IMPLANT
TROCAR XCEL NON-BLD 5MMX100MML (ENDOMECHANICALS) ×2 IMPLANT

## 2014-06-16 NOTE — Progress Notes (Signed)
Patient ID: Robert [name], male   DOB: 1961-05-17, 53 y.o.   MRN: [id number]  Plan for repositioning of PD catheter today at 11:30 Ancef on call to OR Pt is NPO Obtain consent Dr. Rosendo Gros to go further into detail regarding the procedure in the holding area, RN notify to advise the family.    [name], [facility] Pager [phone]) For consults and floor pages call [phone](7A-4:30P)  06/16/2014  9:39 AM

## 2014-06-16 NOTE — Progress Notes (Signed)
CAPD Note  Attempted exchange but only drained 50 cc of hazy, yellow effluent. Capped patient off.

## 2014-06-16 NOTE — Plan of Care (Signed)
Problem: Phase II Progression Outcomes Goal: Tolerating diet Outcome: Not Progressing Patient still nauseated throughout the night.

## 2014-06-16 NOTE — Progress Notes (Signed)
ANTIBIOTIC CONSULT NOTE - INITIAL  Pharmacy Consult for Vancomycin + Ceftazidime Indication: Empiric peritonitis coverage  No Known Allergies  Patient Measurements: Height: 6' (182.9 cm) Weight: 306 lb 12.8 oz (139.164 kg) IBW/kg (Calculated) : 77.6  Vital Signs: Temp: 98.6 F (37 C) (09/09 1040) Temp src: Oral (09/09 1040) BP: 163/87 mmHg (09/09 1040) Pulse Rate: 69 (09/09 1040) Intake/Output from previous day: 09/08 0701 - 09/09 0700 In: 480 [P.O.:480] Out: 700 [Urine:700] Intake/Output from this shift:    Labs:  Recent Labs  06/14/14 0530 06/16/14 0610  WBC 7.6  --   HGB 8.8*  --   PLT 279  --   CREATININE 17.38* 17.49*   Estimated Creatinine Clearance: 6.9 ml/min (by C-G formula based on Cr of 17.49). No results found for this basename: VANCOTROUGH, Corlis Leak, VANCORANDOM, [place], GENTPEAK, GENTRANDOM, TOBRATROUGH, TOBRAPEAK, TOBRARND, AMIKACINPEAK, AMIKACINTROU, AMIKACIN,  in the last 72 hours   Microbiology: Recent Results (from the past 720 hour(s))  BODY FLUID CULTURE     Status: None   Collection Time    06/15/14 10:16 PM      Result Value Ref Range Status   Specimen Description PERITONEAL FLUID PERITONEAL CAVITY   Final   Special Requests Immunocompromised   Final   Gram Stain     Final   Value: MODERATE WBC PRESENT,BOTH PMN AND MONONUCLEAR     NO ORGANISMS SEEN     Performed at [facility]   Culture PENDING   Incomplete   Report Status PENDING   Incomplete    Medical History: Past Medical History  Diagnosis Date  . Diabetes mellitus without complication   . Hypertension   . Thyroid disease   . Sleep apnea 06/18/11    split-night sleep study- [facility].  . Hyperlipidemia   . PAT (paroxysmal atrial tachycardia)   . PSVT (paroxysmal supraventricular tachycardia)   . Blood transfusion without reported diagnosis   . Chronic kidney disease     Stage IV  . CPAP (continuous positive airway pressure)  dependence   . Dysrhythmia   . Shortness of breath   . Hypothyroidism   . GERD (gastroesophageal reflux disease)     n/v a lot  . Headache(784.0)     slight dull h/a due kidney failure  . Cancer     skin ca right shoulder  . Anemia     low hgb at present  . Umbilical hernia     will repair with thi surgery    Assessment: 44 YOM with CKD V/ESRD who presented with uremic symptoms - N/V/weakness. The patient recently had a recent PD catheter placed on 05/18/14 with plans to start PD however appears to have not yet been started. Peritoneal dialysis was initiated this admission with difficulties getting the PD catheter to drain that did not respond to tPA - the patient was taken to the OR today for repositioning. The nephrologist was concerned for peritonitis and has requested IP Vancomycin + Ceftazidime. Pharmacy with assist with appropriate dosing/scheduling.  The patient plans to get a long dwell of 6 hours, followed by 4 hour dwells - will plan to place antibiotics in the long dwell each day. Vancomycin 1500 mg IP + Ceftazidime 2g IP have been ordered today. The patient does have residual UOP - so will plan to check a Vanc level in 48 hours and re-dose based on levels. Will plan to place Ceftazidime in the long dwell daily. If the patient transitions to IHD - will  plan to transition to IV antibiotics.   Goal of Therapy:  Vancomycin trough level 15-20 mcg/ml Proper antibiotics for infection/cultures adjusted for renal/hepatic function   Plan:  1. Vancomycin 1500 mg IP x 1 dose today - placed in 6 hour dwell 2. Will plan to check a random Vancomycin level on 9/11 and will re-dose at that time  3. Ceftazidime 2g IP x 1 dose today followed by 1.5g daily - placed in 6 hour dwell 4. Will continue to follow PD schedule/duration, culture results, LOT, and antibiotic de-escalation plans   Alycia Rossetti, PharmD, BCPS Clinical Pharmacist Pager: [phone] 06/16/2014 11:59 AM

## 2014-06-16 NOTE — Anesthesia Preprocedure Evaluation (Addendum)
Anesthesia Evaluation  Patient identified by MRN, date of birth, ID band Patient awake    Reviewed: Allergy & Precautions, H&P , NPO status , Patient's Chart, lab work & pertinent test results, reviewed documented beta blocker date and time   Airway Mallampati: II TM Distance: >3 FB Neck ROM: full    Dental  (+) Dental Advidsory Given, Teeth Intact   Pulmonary shortness of breath and with exertion, sleep apnea and Continuous Positive Airway Pressure Ventilation , former smoker,          Cardiovascular hypertension, Rhythm:regular     Neuro/Psych  Headaches,    GI/Hepatic GERD-  Medicated and Controlled,  Endo/Other  diabetesHypothyroidism   Renal/GU ESRFRenal disease     Musculoskeletal   Abdominal   Peds  Hematology  (+) anemia ,   Anesthesia Other Findings   Reproductive/Obstetrics                         Anesthesia Physical Anesthesia Plan  ASA: III  Anesthesia Plan:    Post-op Pain Management:    Induction:   Airway Management Planned:   Additional Equipment:   Intra-op Plan:   Post-operative Plan:   Informed Consent:   Dental Advisory Given  Plan Discussed with: Anesthesiologist, CRNA and Surgeon  Anesthesia Plan Comments:        Anesthesia Quick Evaluation

## 2014-06-16 NOTE — Progress Notes (Signed)
PT Cancellation Note  Patient Details Name: KEEGAN SCHNEIDER MRN: [id number] DOB: 20-Jun-1961   Cancelled Treatment:    Reason Eval/Treat Not Completed: Patient at procedure or test/unavailable;Other (comment)  Orders received and chart reviewed; Noted one PT order and  2 discontinue PT orders;  Will need clarification of orders before we proceed with PT eval, and it is worth considering OT consult for shoulder/ADLs; Noted Dr. Percell Miller to give shoulder injection later today;  Currently pt is in OR;  Will follow-up for appropriateness of PT tomorrow;   Thanks,  Roney Marion, PT  Acute Rehabilitation Services Pager [phone] Office [phone]    [place], [place] 06/16/2014, 11:12 AM

## 2014-06-16 NOTE — Anesthesia Postprocedure Evaluation (Signed)
  Anesthesia Post-op Note  Patient: Robert [name]  Procedure(s) Performed: Procedure(s): LAPAROSCOPIC REPOSITIONING CAPD CATHETER (N/A)  Patient Location: PACU  Anesthesia Type:General  Level of Consciousness: awake and alert   Airway and Oxygen Therapy: Patient Spontanous Breathing  Post-op Pain: none  Post-op Assessment: Post-op Vital signs reviewed, Patient's Cardiovascular Status Stable, Respiratory Function Stable, Patent Airway, No signs of Nausea or vomiting and Pain level controlled  Post-op Vital Signs: Reviewed and stable  Last Vitals:  Filed Vitals:   06/16/14 1345  BP: 141/80  Pulse: 63  Temp:   Resp: 13    Complications: No apparent anesthesia complications

## 2014-06-16 NOTE — Transfer of Care (Signed)
Immediate Anesthesia Transfer of Care Note  Patient: Robert [name]  Procedure(s) Performed: Procedure(s): LAPAROSCOPIC REPOSITIONING CAPD CATHETER (N/A)  Patient Location: PACU  Anesthesia Type:General  Level of Consciousness: awake, alert  and oriented  Airway & Oxygen Therapy: Patient Spontanous Breathing and Patient connected to face mask oxygen  Post-op Assessment: Report given to PACU RN, Post -op Vital signs reviewed and stable and Patient moving all extremities X 4  Post vital signs: Reviewed and stable  Complications: No apparent anesthesia complications

## 2014-06-16 NOTE — Progress Notes (Signed)
Pt has home CPAP at bedside. Wife states he can place himself on. RT available if needed.

## 2014-06-16 NOTE — Progress Notes (Signed)
I have seen and examined the pt and agree with NP-Riebock's progress note.  To OR for CAPD repositioning.

## 2014-06-16 NOTE — Op Note (Signed)
06/12/2014 - 06/16/2014  12:41 PM  PATIENT:  Robert [name]  53 y.o. male  PRE-OPERATIVE DIAGNOSIS:  Malfunctioning CAPD, Renal Failure  POST-OPERATIVE DIAGNOSIS:  Renal Failure  PROCEDURE:  Procedure(s): LAPAROSCOPIC REPOSITIONING CAPD CATHETER (N/A)  SURGEON:  Surgeon(s) and Role:    * Ralene Ok, MD - Primary  ASSISTANTS: none   ANESTHESIA:   local and general  EBL:     BLOOD ADMINISTERED:none  DRAINS: none   LOCAL MEDICATIONS USED:  BUPIVICAINE   SPECIMEN:  No Specimen  DISPOSITION OF SPECIMEN:  N/A  COUNTS:  YES  TOURNIQUET:  * No tourniquets in log *  DICTATION: .Dragon Dictation After the patient was consented he was taking Back to the operating room and placed in supine position with bilateral SCDs in place. Patient was prepped and draped in the usual sterile fashion. A timeout was called and all facts were verified.  A Veress needle technique was used to insufflate the abdomen to 15 mm mercury in the left subcostal margin. Subsequent to this A 5 mm trocar and camera then placed intra-abdominally. There is no injury to any intra-abdominal organs. A 5 mm trocar was placed in the left lower quadrant Under direct visualization. At this time visualizing the CAPD catheter was in place. A low part of the catheter seemed to be enveloped in adhesions.  Pictures of this were taken and placed into the chart. The catheter was removed from this area of scar tissue and placed into the pelvis behind the bladder. At this time 300 cc of normal saline was infiltrated via the CAPD catheter. This was evacuated with gravity.  At this time secondary to the fact that the same infiltrated and emptied freely the insufflation was evacuated and all trochars were removed. Skin was reapproximated for Monocryl subcuticular fashion. Skin was dressed with Steri-Strips gauze and tape.  Patient tolerated procedure well. He states to the recovery room in stable condition.  PLAN OF CARE: Already  admitted  PATIENT DISPOSITION:  PACU - hemodynamically stable.   Delay start of Pharmacological VTE agent (>24hrs) due to surgical blood loss or risk of bleeding: not applicable

## 2014-06-16 NOTE — Progress Notes (Signed)
CAPD Note  Patient drained 25cc of dark yellow, cloudy effluent with fibrin after cath being TPA'd on previous exchange.  Specimen sent to lab.  [name] Pronto, MD notified and he instructed me to attempt a rapid exchange to get cath flowing.  After instilling 1L of fluid, I immediately got 1L of hazy yellow effluent.  I tried to continue with the regular exchange using 2L of heparinized dialysate, but the patient started complaining of excruciating abdominal pain and nausea.  I stopped with 1L and capped patient off.  He was medicated with Phenergan and Tordol.

## 2014-06-16 NOTE — Progress Notes (Signed)
Subjective:  S/P TPA which seemed to have an impact initially but then ultimately failed- plans to have a look in the OR for repositioning to see if can get it to work.  Unfortunately now also looks like peritonitis- he is reporting worsening abdominal pain and nausea Objective Vital signs in last 24 hours: Filed Vitals:   06/15/14 2200 06/16/14 0533 06/16/14 0939 06/16/14 1040  BP: 158/76 165/95 159/80 163/87  Pulse: 79 68 68 69  Temp: 98.6 F (37 C) 98.3 F (36.8 C) 98.1 F (36.7 C) 98.6 F (37 C)  TempSrc: Oral Oral Oral Oral  Resp: 18 17 18 18   Height:      Weight:      SpO2: 96% 97% 98% 98%   Weight change:   Intake/Output Summary (Last 24 hours) at 06/16/14 1053 Last data filed at 06/15/14 1648  Gross per 24 hour  Intake    240 ml  Output    500 ml  Net   -260 ml    Assessment/ Plan: Pt is a 53 y.o. yo male who was admitted on 06/12/2014 with uremia needing initiation of dialysis as well as trauma to right shoulder  Assessment/Plan: 1. New ESRD- PD catheter not functional now for several days- has failed aggressive bowel regimen and TPA to PD cath- X ray confirms that it is in the right place- for exploration in the OR today to see if can get to work.  If that fails, I have placed order for IR to place PC and we will do hemodialysis.  Now added wrinkle of likely peritonitis with elevated WBC in peritoneum- will instill intra PD antibiotics if able to get PD cath functional  2. Shoulder pain- x ray did not show any bony abnormality- sprain of AC joint- to get injection - stay without sling 3. Anemia- significant and predated admission- on aranesp- will check iron stores- pending 4. Secondary hyperparathyroidism- high phos, low calc- have started tums- will fine tune when not so constipated 5. HTN/volume- overloaded- should resolve when we are able to do dialysis 6. OSA- needs CPAP 7. DM- reasonable on current regimen 8. Presumed peritonitis with elevated WBC in PD fluid- gram  stain negative- cover with vanc and fortaz - pharmacy to assist.   Mykela Mewborn A    Labs: Basic Metabolic Panel:  Recent Labs Lab 06/13/14 0415 06/13/14 0846 06/14/14 0530 06/16/14 0610  NA  --  138 139 139  K  --  4.7 4.8 4.6  CL  --  94* 95* 96  CO2  --  18* 16* 18*  GLUCOSE  --  181* 113* 149*  BUN  --  137* 136* 137*  CREATININE  --  17.61* 17.38* 17.49*  CALCIUM  --  6.6* 7.4* 8.0*  PHOS 11.2*  --  11.5* 10.3*   Liver Function Tests:  Recent Labs Lab 06/12/14 0840 06/12/14 1353 06/13/14 0846 06/14/14 0530 06/16/14 0610  AST 11 10 11   --   --   ALT 12 11 10   --   --   ALKPHOS 57 53 52  --   --   BILITOT 0.3 0.2* 0.3  --   --   PROT 7.1 6.8 6.6  --   --   ALBUMIN 3.3* 3.2* 3.0* 3.1* 3.0*    Recent Labs Lab 06/12/14 0840  LIPASE 95*   No results found for this basename: AMMONIA,  in the last 168 hours CBC:  Recent Labs Lab 06/12/14 0840 06/13/14 0415 06/14/14 0530  WBC 8.8 7.4 7.6  NEUTROABS 7.0  --   --   HGB 8.5* 8.2* 8.8*  HCT 25.4* 24.2* 26.6*  MCV 86.4 87.4 87.8  PLT 252 228 279   Cardiac Enzymes: No results found for this basename: CKTOTAL, CKMB, CKMBINDEX, TROPONINI,  in the last 168 hours CBG:  Recent Labs Lab 06/15/14 1144 06/15/14 1651 06/15/14 2156 06/16/14 0743 06/16/14 1039  GLUCAP 159* 163* 178* 143* 126*    Iron Studies: No results found for this basename: IRON, TIBC, TRANSFERRIN, FERRITIN,  in the last 72 hours Studies/Results: Dg Abd 1 View  06/14/2014   CLINICAL DATA:  Non functioning peritoneal dialysis catheter. Confirm position.  EXAM: ABDOMEN - 1 VIEW  COMPARISON:  CT abdomen pelvis 06/12/2014.  FINDINGS: Peritoneal dialysis catheter terminates in the right anatomic pelvis, superimposed with the right sacrum. Stool and gas are scattered in the colon. Probable pleural parenchymal scarring at the base of the left hemi thorax.  IMPRESSION: Peritoneal dialysis catheter terminates in the right anatomic pelvis,  superimposed with the right sacrum.   Electronically Signed   By: Lorin Picket M.D.   On: 06/14/2014 13:06   Medications: Infusions: . dialysis solution for CAPD/CCPD (DELFLEX)    . dialysis solution 1.5% low-MG/low-CA    . dialysis solution 2.5% low-MG/low-CA      Scheduled Medications: . aspirin EC  81 mg Oral Daily  . atorvastatin  40 mg Oral q1800  . calcium carbonate  800 mg of elemental calcium Oral TID WC  .  ceFAZolin (ANCEF) IV  2 g Intravenous On Call to OR  . cholecalciferol  4,000 Units Oral Daily  . darbepoetin (ARANESP) injection - DIALYSIS  40 mcg Intravenous Q Thu-HD  . dialysis solution for CAPD/CCPD [facility])   Peritoneal Dialysis Once in dialysis  . dialysis solution 1.5% low-MG/low-CA   Intraperitoneal Once in dialysis  . docusate sodium  100 mg Oral QPM  . ezetimibe  10 mg Oral QPM  . feeding supplement (NEPRO CARB STEADY)  237 mL Oral BID BM  . heparin  1,500 Units Intraperitoneal 6 times per day  . hydrALAZINE  100 mg Oral BID  . insulin aspart  0-20 Units Subcutaneous TID WC  . insulin glargine  11 Units Subcutaneous Daily  . ketorolac  7.5 mg Intravenous 4 times per day  . levothyroxine  100 mcg Oral QAC breakfast  . nebivolol  15 mg Oral q morning - 10a  . sodium chloride  3 mL Intravenous Q12H  . vitamin E  400 Units Oral q morning - 10a    have reviewed scheduled and prn medications.  Physical Exam: General: lying in bed, feeling poorly Heart: RRR Lungs: mostly clear Abdomen: obese- distended Extremities: edema Dialysis Access: PD catheter    06/16/2014,10:53 AM  LOS: 4 days

## 2014-06-17 ENCOUNTER — Inpatient Hospital Stay (HOSPITAL_COMMUNITY): Payer: BC Managed Care – PPO

## 2014-06-17 ENCOUNTER — Encounter (HOSPITAL_COMMUNITY): Payer: Self-pay | Admitting: General Surgery

## 2014-06-17 LAB — RENAL FUNCTION PANEL
Albumin: 3 g/dL — ABNORMAL LOW (ref 3.5–5.2)
Anion gap: 24 — ABNORMAL HIGH (ref 5–15)
BUN: 133 mg/dL — ABNORMAL HIGH (ref 6–23)
CO2: 18 meq/L — AB (ref 19–32)
CREATININE: 18.25 mg/dL — AB (ref 0.50–1.35)
Calcium: 8 mg/dL — ABNORMAL LOW (ref 8.4–10.5)
Chloride: 99 mEq/L (ref 96–112)
GFR calc non Af Amer: 2 mL/min — ABNORMAL LOW (ref >90)
GFR, EST AFRICAN AMERICAN: 3 mL/min — AB (ref >90)
Glucose, Bld: 115 mg/dL — ABNORMAL HIGH (ref 70–99)
Phosphorus: 9.8 mg/dL — ABNORMAL HIGH (ref 2.3–4.6)
Potassium: 4.7 mEq/L (ref 3.7–5.3)
Sodium: 141 mEq/L (ref 137–147)

## 2014-06-17 LAB — GLUCOSE, CAPILLARY
Glucose-Capillary: 136 mg/dL — ABNORMAL HIGH (ref 70–99)
Glucose-Capillary: 172 mg/dL — ABNORMAL HIGH (ref 70–99)
Glucose-Capillary: 115 mg/dL — ABNORMAL HIGH (ref 70–99)
Glucose-Capillary: 109 mg/dL — ABNORMAL HIGH (ref 70–99)
Glucose-Capillary: 115 mg/dL — ABNORMAL HIGH (ref 70–99)
Glucose-Capillary: 161 mg/dL — ABNORMAL HIGH (ref 70–99)

## 2014-06-17 LAB — CBC
HCT: 23 % — ABNORMAL LOW (ref 39.0–52.0)
HEMATOCRIT: 22.5 % — AB (ref 39.0–52.0)
HEMOGLOBIN: 7.4 g/dL — AB (ref 13.0–17.0)
HEMOGLOBIN: 7.4 g/dL — AB (ref 13.0–17.0)
MCH: 28.5 pg (ref 26.0–34.0)
MCH: 27.9 pg (ref 26.0–34.0)
MCHC: 32.9 g/dL (ref 30.0–36.0)
MCHC: 32.2 g/dL (ref 30.0–36.0)
MCV: 86.5 fL (ref 78.0–100.0)
MCV: 86.8 fL (ref 78.0–100.0)
Platelets: 207 10*3/uL (ref 150–400)
Platelets: 210 10*3/uL (ref 150–400)
RBC: 2.6 MIL/uL — ABNORMAL LOW (ref 4.22–5.81)
RBC: 2.65 MIL/uL — AB (ref 4.22–5.81)
RDW: 14.3 % (ref 11.5–15.5)
RDW: 14.3 % (ref 11.5–15.5)
WBC: 8.3 10*3/uL (ref 4.0–10.5)
WBC: 8.9 10*3/uL (ref 4.0–10.5)

## 2014-06-17 LAB — PROTIME-INR
INR: 1.22 (ref 0.00–1.49)
Prothrombin Time: 15.4 seconds — ABNORMAL HIGH (ref 11.6–15.2)

## 2014-06-17 LAB — APTT: aPTT: 36 seconds (ref 24–37)

## 2014-06-17 LAB — PARATHYROID HORMONE, INTACT (NO CA): PTH: 186 pg/mL — ABNORMAL HIGH (ref 14–64)

## 2014-06-17 MED ORDER — FENTANYL CITRATE 0.05 MG/ML IJ SOLN
INTRAMUSCULAR | Status: AC
  Filled 2014-06-17: qty 2

## 2014-06-17 MED ORDER — VANCOMYCIN HCL IN DEXTROSE 1-5 GM/200ML-% IV SOLN
1000.0000 mg | INTRAVENOUS | Status: AC
  Administered 2014-06-17: 1000 mg via INTRAVENOUS
  Filled 2014-06-17: qty 200

## 2014-06-17 MED ORDER — LIDOCAINE HCL 1 % IJ SOLN
INTRAMUSCULAR | Status: AC
  Filled 2014-06-17: qty 20

## 2014-06-17 MED ORDER — DEXTROSE 5 % IV SOLN
1.0000 g | INTRAVENOUS | Status: DC
  Administered 2014-06-17 – 2014-06-20 (×4): 1 g via INTRAVENOUS
  Filled 2014-06-17 (×6): qty 1

## 2014-06-17 MED ORDER — MIDAZOLAM HCL 2 MG/2ML IJ SOLN
INTRAMUSCULAR | Status: AC | PRN
  Administered 2014-06-17 (×2): 1 mg via INTRAVENOUS

## 2014-06-17 MED ORDER — FENTANYL CITRATE 0.05 MG/ML IJ SOLN
INTRAMUSCULAR | Status: AC | PRN
  Administered 2014-06-17 (×2): 25 ug via INTRAVENOUS

## 2014-06-17 MED ORDER — HEPARIN SODIUM (PORCINE) 1000 UNIT/ML IJ SOLN
INTRAMUSCULAR | Status: AC
  Filled 2014-06-17: qty 1

## 2014-06-17 MED ORDER — MIDAZOLAM HCL 2 MG/2ML IJ SOLN
INTRAMUSCULAR | Status: AC
  Filled 2014-06-17: qty 2

## 2014-06-17 MED ORDER — PROMETHAZINE HCL 25 MG PO TABS
25.0000 mg | ORAL_TABLET | ORAL | Status: DC | PRN
  Administered 2014-06-17 – 2014-06-19 (×6): 25 mg via ORAL
  Filled 2014-06-17 (×6): qty 1

## 2014-06-17 MED ORDER — DARBEPOETIN ALFA-POLYSORBATE 40 MCG/0.4ML IJ SOLN
INTRAMUSCULAR | Status: AC
  Filled 2014-06-17: qty 0.4

## 2014-06-17 MED ORDER — VANCOMYCIN HCL IN DEXTROSE 1-5 GM/200ML-% IV SOLN
1000.0000 mg | Freq: Once | INTRAVENOUS | Status: AC
  Administered 2014-06-17: 1000 mg via INTRAVENOUS
  Filled 2014-06-17: qty 200

## 2014-06-17 NOTE — Consult Note (Signed)
Reason for Consult: Uremic; PD cath malfunction/nonfunctioning Chief Complaint: Chief Complaint  Patient presents with  . Emesis  Malfunctioning peritoneal dialysis catheter Prob peritonitis  Referring Physician(s): Dr Moshe Cipro  History of Present Illness: Pt with stage IV chronic kidney disease Peritoneal dialysis PD cath malfunction To OR yesterday for repositioning---seemed to work; but only short term Non functioning again today Need new Tunneled dialysis catheter placed for dialysis today.  Robert [name] is a 53 y.o. male  Stage 4 CKD; peritoneal dialysis; malfunction cath; uremic; DM; L nephrectomy 1974; OSA; hypothyroid; HDL; protein calorie malnutrition  Past Medical History  Diagnosis Date  . Diabetes mellitus without complication   . Hypertension   . Thyroid disease   . Sleep apnea 06/18/11    split-night sleep study- [facility].  . Hyperlipidemia   . PAT (paroxysmal atrial tachycardia)   . PSVT (paroxysmal supraventricular tachycardia)   . Blood transfusion without reported diagnosis   . Chronic kidney disease     Stage IV  . CPAP (continuous positive airway pressure) dependence   . Dysrhythmia   . Shortness of breath   . Hypothyroidism   . GERD (gastroesophageal reflux disease)     n/v a lot  . Headache(784.0)     slight dull h/a due kidney failure  . Cancer     skin ca right shoulder  . Anemia     low hgb at present  . Umbilical hernia     will repair with thi surgery    Past Surgical History  Procedure Laterality Date  . Nephrectomy Left 1974  . Renal biopsy Right 2012  . Skin cancer excision    . Appendectomy  1974  . Capd insertion N/A 05/18/2014    Procedure: LAPAROSCOPIC INSERTION CONTINUOUS AMBULATORY PERITONEAL DIALYSIS  (CAPD) CATHETER;  Surgeon: Ralene Ok, MD;  Location: [facility];  Service: General;  Laterality: N/A;  . Umbilical hernia repair N/A 05/18/2014    Procedure: HERNIA REPAIR UMBILICAL  ADULT;  Surgeon: Ralene Ok, MD;  Location: [facility];  Service: General;  Laterality: N/A;    Allergies: Review of patient's allergies indicates no known allergies.  Medications: Prior to Admission medications   Medication Sig Start Date End Date Taking? Authorizing Provider  aspirin 81 MG tablet Take 81 mg by mouth every morning.    Yes Historical Provider, MD  BYSTOLIC 10 MG tablet Take 15 mg by mouth every morning.  08/10/13  Yes Historical Provider, MD  cetirizine (ZYRTEC) 10 MG tablet Take 10 mg by mouth every morning.    Yes Historical Provider, MD  Cholecalciferol (VITAMIN D) 2000 UNITS tablet Take 4,000 Units by mouth every morning.    Yes Historical Provider, MD  Coenzyme Q-10 100 MG capsule Take 100 mg by mouth every morning.   Yes Historical Provider, MD  docusate sodium (COLACE) 100 MG capsule Take 100 mg by mouth every evening.    Yes Historical Provider, MD  ezetimibe (ZETIA) 10 MG tablet Take 10 mg by mouth every evening.    Yes Historical Provider, MD  fluocinonide cream (LIDEX) [id number] % Apply 1 application topically daily as needed (Skin inflammation (hand, thigh, back)).    Yes Historical Provider, MD  Hyaluronic Acid-Vitamin C (HYALURONIC ACID PO) Take 20 mg by mouth every morning.   Yes Historical Provider, MD  hydrALAZINE (APRESOLINE) 50 MG tablet Take 100 mg by mouth 2 (two) times daily.  08/10/13  Yes Historical Provider, MD  Insulin Glargine (LANTUS SOLOSTAR) 100  UNIT/ML Solostar Pen Inject 22 Units into the skin daily.    Yes Historical Provider, MD  insulin regular human CONCENTRATED (HUMULIN R) 500 UNIT/ML SOLN injection Inject 5 Units into the skin 3 (three) times daily with meals.   Yes Historical Provider, MD  lactulose, encephalopathy, (CHRONULAC) 10 GM/15ML SOLN Take 20-40 g by mouth daily as needed (Laxative).  05/24/14  Yes Historical Provider, MD  levothyroxine (SYNTHROID, LEVOTHROID) 100 MCG tablet Take 1 tablet (100 mcg total) by mouth daily. 01/05/14  Yes Elayne Snare, MD  Liraglutide (VICTOZA) 18 MG/3ML SOPN Inject 1.2 mg into the skin daily. 01/04/14  Yes Elayne Snare, MD  rosuvastatin (CRESTOR) 20 MG tablet Take 1 tablet (20 mg total) by mouth daily. 01/05/14  Yes Elayne Snare, MD  senna (SENOKOT) 8.6 MG tablet Take 1 tablet by mouth every evening.    Yes Historical Provider, MD  vitamin E 400 UNIT capsule Take 400 Units by mouth every morning.    Yes Historical Provider, MD  gentamicin cream (GARAMYCIN) 0.1 % Apply 1 application topically once a week. Apply weekly to peritoneal catheter 06/02/14   Historical Provider, MD  Insulin Pen Needle 32G X 4 MM MISC Use 4 pen needles per day to inject insulin 01/05/14   Elayne Snare, MD  NON FORMULARY every evening. CPAP therapy.    Historical Provider, MD    Family History  Problem Relation Age of Onset  . Colon polyps Father   . Colon cancer Neg Hx     History   Social History  . Marital Status: Married    Spouse Name: N/A    Number of Children: N/A  . Years of Education: N/A   Social History Main Topics  . Smoking status: Former Smoker    Types: Pipe    Quit date: 12/06/2013  . Smokeless tobacco: Never Used  . Alcohol Use: No     Comment: no alcohol since March per pt.  . Drug Use: No  . Sexual Activity: None   Other Topics Concern  . None   Social History Narrative  . None    ECOG Status: 2 - Symptomatic, <50% confined to bed  Review of Systems: A 12 point ROS discussed and pertinent positives are indicated in the HPI above.  All other systems are negative.  Review of Systems  Constitutional: Positive for appetite change and fatigue. Negative for fever.  Respiratory: Positive for apnea and shortness of breath. Negative for choking and chest tightness.        Osa  Cardiovascular: Negative for chest pain.  Gastrointestinal: Positive for abdominal pain and abdominal distention.  Genitourinary: Positive for difficulty urinating.  Musculoskeletal: Positive for back pain. Negative for  arthralgias.  Skin: Negative for color change.  Psychiatric/Behavioral: Negative for agitation.    Vital Signs: BP 150/70  Pulse 76  Temp(Src) 98.1 F (36.7 C) (Oral)  Resp 19  Ht 6' (1.829 m)  Wt 139.164 kg (306 lb 12.8 oz)  BMI 41.60 kg/m2  SpO2 95%  Physical Exam  Constitutional: He is oriented to person, place, and time.  Cardiovascular: Normal rate and regular rhythm.   No murmur heard. Pulmonary/Chest: No respiratory distress. He has wheezes.  Abdominal: Soft. He exhibits distension. There is tenderness.  Musculoskeletal: Normal range of motion.  Neurological: He is alert and oriented to person, place, and time.  Skin: Skin is warm and dry.  Psychiatric: He has a normal mood and affect. His behavior is normal. Judgment and thought content normal.  Imaging: Ct Abdomen Pelvis Wo Contrast  06/12/2014   CLINICAL DATA:  Vomiting  EXAM: CT ABDOMEN AND PELVIS WITHOUT CONTRAST  TECHNIQUE: Multidetector CT imaging of the abdomen and pelvis was performed following the standard protocol without IV contrast.  COMPARISON:  05/05/2014  FINDINGS: Bibasilar linear atelectasis  Minimal aortic valve calcification  Calcified granuloma in the liver.  Gallbladder, spleen, pancreas, adrenal glands are within normal limits  Small 17 mm cysts in the left renal fossa is stable  Peritoneal dialysis catheter in the right lower quadrant has been placed.  Stable appearance of the right kidney with a prominent renal pelvis and perinephric stranding. No caliectasis.  Small para-aortic and iliac chain nodes.  Small left inguinal hernia containing adipose tissue.  Diverticulosis of the descending and sigmoid colon.  Appendicolith.  No obvious evidence of acute appendicitis.  Small lipoma in the distal ileum on image 77 is stable.  Umbilical hernia contains fat. Right posterior and lateral abdominal wall hernia containing retroperitoneal fat. This is stable.  Lumbar degenerative change.  Stranding in the left  abdominal anterior subcutaneous fat is stable and of unknown significance.  IMPRESSION: No acute disease. Chronic and postoperative changes. Peritoneal dialysis catheter has been placed.   Electronically Signed   By: Maryclare Bean M.D.   On: 06/12/2014 10:10   Dg Chest 2 View  06/12/2014   CLINICAL DATA:  Fall.  Shortness of breath, dizziness and vomiting.  EXAM: CHEST - 2 VIEW  COMPARISON:  05/11/2014  FINDINGS: Stable bilateral pleural thickening and subtle areas of nodularity in both lungs with suspected calcified granuloma of the left upper lobe. Scattered areas of parenchymal scarring have a stable appearance bilaterally. The heart size is stable and at the upper limits of normal. There is no evidence of pulmonary edema, consolidation, pneumothorax or pleural fluid.  IMPRESSION: Stable chronic pleural thickening and areas of parenchymal nodularity and scarring bilaterally. It was mentioned on the prior chest x-ray report that these changes appear to be progressive by chest x-ray. Recommend eventual correlation, as previously recommended, with CT of the chest.   Electronically Signed   By: Aletta Edouard M.D.   On: 06/12/2014 09:51   Dg Shoulder Right  06/12/2014   CLINICAL DATA:  Fall with right shoulder pain.  EXAM: RIGHT SHOULDER - 2+ VIEW  COMPARISON:  01/28/2014  FINDINGS: No acute fracture or dislocation is identified. Stable degenerative changes are seen involving the [facility] joint as well as the humeral head. No bony lesions or destruction. Soft tissues are unremarkable.  IMPRESSION: No acute fracture or dislocation.  Stable degenerative changes.   Electronically Signed   By: Aletta Edouard M.D.   On: 06/12/2014 09:45   Dg Abd 1 View  06/14/2014   CLINICAL DATA:  Non functioning peritoneal dialysis catheter. Confirm position.  EXAM: ABDOMEN - 1 VIEW  COMPARISON:  CT abdomen pelvis 06/12/2014.  FINDINGS: Peritoneal dialysis catheter terminates in the right anatomic pelvis, superimposed with the right  sacrum. Stool and gas are scattered in the colon. Probable pleural parenchymal scarring at the base of the left hemi thorax.  IMPRESSION: Peritoneal dialysis catheter terminates in the right anatomic pelvis, superimposed with the right sacrum.   Electronically Signed   By: Lorin Picket M.D.   On: 06/14/2014 13:06    Labs: Lab Results  Component Value Date   WBC 7.6 06/14/2014   HCT 26.6* 06/14/2014   MCV 87.8 06/14/2014   PLT 279 06/14/2014   NA 139 06/16/2014  K 4.6 06/16/2014   CL 96 06/16/2014   CO2 18* 06/16/2014   GLUCOSE 149* 06/16/2014   BUN 137* 06/16/2014   CREATININE 17.49* 06/16/2014   CALCIUM 8.0* 06/16/2014   PROT 6.6 06/13/2014   ALBUMIN 3.0* 06/16/2014   AST 11 06/13/2014   ALT 10 06/13/2014   ALKPHOS 52 06/13/2014   BILITOT 0.3 06/13/2014   GFRNONAA 3* 06/16/2014   GFRAA 3* 06/16/2014    Assessment and Plan: Pt with malfunctioning/nonfunctioning peritoneal dialysis catheter Surgical repositioning not fully successful Now scheduled for tunneled hemodialysis catheter placement Pt aware of procedure benefits and risks and agreeable to proceed Consent signed and in chart    I spent a total of 25 minutes face to face in clinical consultation, greater than 50% of which was counseling/coordinating care for tunneled dialysis catheter placement  Thank you for this interesting consult.  I greatly enjoyed meeting LANEY TUFO and look forward to participating in their care.   Lavonia Drafts [facility] [phone]

## 2014-06-17 NOTE — Procedures (Signed)
Procedure:  Tunneled HD catheter placement Access:  Right IJ vein Findings:  23 cm tip to cuff length Hemosplit cath placed with tips in RA.  No PTX. OK to use.

## 2014-06-17 NOTE — Progress Notes (Signed)
Patient only drained 383cc of pink tinged peritoneal fluid via CCPD.Dr. Augustin Coupe made aware.Per MD,leave patient on the cycler  for one more hour and if he does not drain,do a manual drain. Jaquia Benedicto, [name], Therapist, sports

## 2014-06-17 NOTE — Progress Notes (Signed)
ANTIBIOTIC CONSULT NOTE - FOLLOW UP  Pharmacy Consult for Vancomycin + Ceftazidime Indication: Empiric peritonitis coverage  No Known Allergies  Patient Measurements: Height: 6' (182.9 cm) Weight: 306 lb 12.8 oz (139.164 kg) IBW/kg (Calculated) : 77.6  Vital Signs: Temp: 98 F (36.7 C) (09/10 0953) Temp src: Oral (09/10 0953) BP: 163/85 mmHg (09/10 0953) Pulse Rate: 75 (09/10 0953) Intake/Output from previous day: 09/09 0701 - 09/10 0700 In: 0  Out: 201 [Urine:200; Stool:1] Intake/Output from this shift: Total I/O In: 0  Out: 1600 [Urine:1600]  Labs:  Recent Labs  06/16/14 0610  CREATININE 17.49*   Estimated Creatinine Clearance: 6.9 ml/min (by C-G formula based on Cr of 17.49). No results found for this basename: VANCOTROUGH, Corlis Leak, VANCORANDOM, GENTTROUGH, GENTPEAK, GENTRANDOM, TOBRATROUGH, TOBRAPEAK, TOBRARND, AMIKACINPEAK, AMIKACINTROU, AMIKACIN,  in the last 72 hours   Microbiology: Recent Results (from the past 720 hour(s))  BODY FLUID CULTURE     Status: None   Collection Time    06/15/14 10:16 PM      Result Value Ref Range Status   Specimen Description PERITONEAL FLUID PERITONEAL CAVITY   Final   Special Requests Immunocompromised   Final   Gram Stain     Final   Value: MODERATE WBC PRESENT,BOTH PMN AND MONONUCLEAR     NO ORGANISMS SEEN     Performed at [facility]   Culture PENDING   Incomplete   Report Status PENDING   Incomplete    Anti-infectives   Start     Dose/Rate Route Frequency Ordered Stop   06/17/14 1000  cefTAZidime (FORTAZ) 200 mg/mL injection 1,500 mg  Status:  Discontinued     1,500 mg Intraperitoneal Daily 06/16/14 1202 06/17/14 1309   06/17/14 1000  vancomycin (VANCOCIN) IVPB 1000 mg/200 mL premix    Comments:  Hang ON CALL to xray 9/10   1,000 mg 200 mL/hr over 60 Minutes Intravenous  Once 06/17/14 0950     06/16/14 1200  vancomycin (VANCOCIN) 100 mg/mL injection 1,500 mg     1,500 mg Intraperitoneal Once in  dialysis 06/16/14 1108     06/16/14 1200  cefTAZidime (FORTAZ) 200 mg/mL injection 2,000 mg     2,000 mg Intraperitoneal Once in dialysis 06/16/14 1129     06/16/14 1130  cefTAZidime (FORTAZ) 200 mg/mL injection 1,000 mg  Status:  Discontinued     1,000 mg Intraperitoneal Once in dialysis 06/16/14 1125 06/16/14 1128   06/16/14 1115  cefTAZidime (FORTAZ) 200 mg/mL injection 2,000 mg  Status:  Discontinued     2,000 mg Intraperitoneal Once in dialysis 06/16/14 1108 06/16/14 1123   06/16/14 0915  [MAR Hold]  ceFAZolin (ANCEF) IVPB 2 g/50 mL premix     (On MAR Hold since 06/16/14 1054)  Comments:  Pharmacy may adjust dosing strength, interval, or rate of medication as needed for optimal therapy for the patient  Send with patient on call to the OR.  Anesthesia to complete antibiotic administration <63min prior to incision per [facility].   2 g 100 mL/hr over 30 Minutes Intravenous On call to O.R. 06/16/14 0911 06/16/14 1204      Assessment: 78 YOM with CKD V/ESRD who presented with uremic symptoms - N/V/weakness. The patient recently had a recent PD catheter placed on 05/18/14 with plans to start PD however appears to have not yet been started. Peritoneal dialysis was initiated this admission with difficulties getting the PD catheter to drain that did not respond to tPA - the patient was taken to  the OR on 9/9 for repositioning. The patient continued to have issues with draining and is getting a TDC placed today with plans to transition to IHD.   A dose of Vancomycin 1500 mg + Ceftazidime 2g were placed in the 6 hour dwell done after the repositioning on 9/9. Now the patient is to transition to IHD - a dose of Vancomycin 1g has been scheduled to be given pre-op by IR (discussed with Monia Sabal, PA). The plan is for the patient to proceed to HD this evening after the catheter is placed - will plan to give an additional maintenance dose after HD and check a random Vancomycin level on 9/11 to further  guide additional doses. The patient is noted to have residual UOP.  Goal of Therapy:  Pre-HD Vancomycin level of 15-25 mcg/ml  Plan:  1. Vancomycin 1g x 1 dose to be given pre-op by IR 2. Vancomycin 1g post HD later this evening 3. Will obtain a Vancomycin random level on 9/11 AM to guide additional doses 4. Ceftazidime 1g IV every 24 hours (while awaiting dialysis schedule) 5. Will continue to follow HD schedule/duration, culture results, LOT, and antibiotic de-escalation plans   Alycia Rossetti, PharmD, BCPS Clinical Pharmacist Pager: [phone] 06/17/2014 1:27 PM

## 2014-06-17 NOTE — Progress Notes (Signed)
Subjective:  Again- the surgery done to remove scar tissue was initially felt to be successful BUT- then stopped draining again- also was found to have urinary retention did in and out foley with 1600 urine - possible that could have impacted draining but Robert [name] and I are both sick of this- we are going to go ahead and initiate hemo today  Objective Vital signs in last 24 hours: Filed Vitals:   06/16/14 1345 06/16/14 1410 06/16/14 2154 06/17/14 0458  BP: 141/80 194/87 143/86 150/70  Pulse: 63 67 74 76  Temp: 97.6 F (36.4 C) 97.7 F (36.5 C) 98.2 F (36.8 C) 98.1 F (36.7 C)  TempSrc:  Oral Oral Oral  Resp: 13 17 18 19   Height:      Weight:      SpO2: 98% 97% 98% 95%   Weight change:   Intake/Output Summary (Last 24 hours) at 06/17/14 0843 Last data filed at 06/17/14 [id number]  Gross per 24 hour  Intake      0 ml  Output   1801 ml  Net  -1801 ml    Assessment/ Plan: Pt is a 53 y.o. yo male who was admitted on 06/12/2014 with uremia needing initiation of dialysis as well as trauma to right shoulder  Assessment/Plan: 1. New ESRD- PD catheter not functional now for several days- has failed aggressive bowel regimen and TPA as well as OR intervention. Patient is uremic and I think the best course of action at this time is to place a PC and get his uremia better. I am not completely convinced that this PD catheter will never work but need to get patient clinically better so that we can deal with it.  Continue with bowel regimen and urine drainage.  2. Shoulder pain- x ray did not show any bony abnormality- sprain of AC joint- to get injection - stay without sling- appreciate ortho assist 3. Anemia- significant and predated admission- on aranesp- iron stores OK 4. Secondary hyperparathyroidism- high phos, low calc- have started tums- will fine tune when not so constipated- hemo will help- PTH pending 5. HTN/volume- overloaded- should resolve when we are able to do dialysis 6. OSA- needs  CPAP 7. DM- reasonable on current regimen 8. Presumed peritonitis with elevated WBC in PD fluid- gram stain negative- culture pending- covering with vanc and fortaz- now will be dosed IV per pharmacy 9. Urinary retention- maybe due to illness/pain meds- pt declines foley catheter- will need to do bladder scans per shift with orders to I and O when large urine volume- I thought foley would be lesser of 2 evils  Robert [name] A    Labs: Basic Metabolic Panel:  Recent Labs Lab 06/13/14 0415 06/13/14 0846 06/14/14 0530 06/16/14 0610  NA  --  138 139 139  K  --  4.7 4.8 4.6  CL  --  94* 95* 96  CO2  --  18* 16* 18*  GLUCOSE  --  181* 113* 149*  BUN  --  137* 136* 137*  CREATININE  --  17.61* 17.38* 17.49*  CALCIUM  --  6.6* 7.4* 8.0*  PHOS 11.2*  --  11.5* 10.3*   Liver Function Tests:  Recent Labs Lab 06/12/14 0840 06/12/14 1353 06/13/14 0846 06/14/14 0530 06/16/14 0610  AST 11 10 11   --   --   ALT 12 11 10   --   --   ALKPHOS 57 53 52  --   --   BILITOT 0.3 0.2* 0.3  --   --  PROT 7.1 6.8 6.6  --   --   ALBUMIN 3.3* 3.2* 3.0* 3.1* 3.0*    Recent Labs Lab 06/12/14 0840  LIPASE 95*   No results found for this basename: AMMONIA,  in the last 168 hours CBC:  Recent Labs Lab 06/12/14 0840 06/13/14 0415 06/14/14 0530  WBC 8.8 7.4 7.6  NEUTROABS 7.0  --   --   HGB 8.5* 8.2* 8.8*  HCT 25.4* 24.2* 26.6*  MCV 86.4 87.4 87.8  PLT 252 228 279   Cardiac Enzymes: No results found for this basename: CKTOTAL, CKMB, CKMBINDEX, TROPONINI,  in the last 168 hours CBG:  Recent Labs Lab 06/16/14 0743 06/16/14 1039 06/16/14 1300 06/16/14 1707 06/16/14 1846  GLUCAP 143* 126* 133* 136* 172*    Iron Studies:   Recent Labs  06/16/14 0610  IRON 63  TIBC 186*  FERRITIN 611*   Studies/Results: No results found. Medications: Infusions: . sodium chloride 10 mL/hr at 06/16/14 1147  . dialysis solution 2.5% low-MG    . heparin      Scheduled  Medications: . aspirin EC  81 mg Oral Daily  . atorvastatin  40 mg Oral q1800  . calcium carbonate  800 mg of elemental calcium Oral TID WC  . cefTAZidime  1,500 mg Intraperitoneal Daily  . cefTAZidime  2,000 mg Intraperitoneal Once in dialysis  . cholecalciferol  4,000 Units Oral Daily  . darbepoetin (ARANESP) injection - DIALYSIS  40 mcg Intravenous Q Thu-HD  . docusate sodium  100 mg Oral QPM  . ezetimibe  10 mg Oral QPM  . feeding supplement (NEPRO CARB STEADY)  237 mL Oral BID BM  . hydrALAZINE  100 mg Oral BID  . insulin aspart  0-20 Units Subcutaneous TID WC  . insulin glargine  11 Units Subcutaneous Daily  . ketorolac  7.5 mg Intravenous 4 times per day  . levothyroxine  100 mcg Oral QAC breakfast  . nebivolol  15 mg Oral q morning - 10a  . sodium chloride  3 mL Intravenous Q12H  . vancomycin  1,500 mg Intraperitoneal Once in dialysis  . vitamin E  400 Units Oral q morning - 10a    have reviewed scheduled and prn medications.  Physical Exam: General: lying in bed, feeling poorly Heart: RRR Lungs: mostly clear Abdomen: obese- distended Extremities: edema Dialysis Access: PD catheter    06/17/2014,8:43 AM  LOS: 5 days

## 2014-06-17 NOTE — Progress Notes (Addendum)
PT Cancellation Note  Patient Details Name: Robert [name] MRN: [id number] DOB: [birth year]   Cancelled Treatment:    Reason Eval/Treat Not Completed: Patient at procedure or test/unavailable Pt off floor getting dialysis catheter placed. Will follow up on 9/11 or when pt appropriate to participate in therapy.  Will need clarification of orders before we proceed with PT eval.     Candy Sledge A 06/17/2014, 3:32 PM Candy Sledge, [place], DPT [phone]

## 2014-06-17 NOTE — Progress Notes (Signed)
Patient ID: Robert [name], male   DOB: Aug 03, 1961, 53 y.o.   MRN: [id number]     [address] [place], [place] [address]    Phone: [phone] FAX: [phone]     Subjective: Unfortunately, PD cath malfunctioned despite LOA and repositioning. Pt is very frustrated.  Did have urinary retention.    Objective:  Vital signs:  Filed Vitals:   06/16/14 1345 06/16/14 1410 06/16/14 2154 06/17/14 0458  BP: 141/80 194/87 143/86 150/70  Pulse: 63 67 74 76  Temp: 97.6 F (36.4 C) 97.7 F (36.5 C) 98.2 F (36.8 C) 98.1 F (36.7 C)  TempSrc:  Oral Oral Oral  Resp: $Remo'13 17 18 19  'qucSX$ Height:      Weight:      SpO2: 98% 97% 98% 95%    Last BM Date: 06/16/14  Intake/Output   Yesterday:  09/09 0701 - 09/10 0700 In: 0  Out: 201 [Urine:200; Stool:1] This shift:  Total I/O In: -  Out: 1600 [Urine:1600]   Physical Exam: General: Pt awake/alert/oriented x4 in no acute distress Abdomen: Soft.  Nondistended. Non tender.  Lap sites c/d/i.  No evidence of peritonitis.  No incarcerated hernias.   Problem List:   Active Problems:   H/O unilateral nephrectomy   Type II or unspecified type diabetes mellitus with renal manifestations, uncontrolled   SVT (supraventricular tachycardia)   Severe obstructive sleep apnea   Hypothyroidism   Hyperlipidemia with target LDL less than 70   Chronic kidney disease, stage IV (severe)   Uremia of renal origin   Protein-calorie malnutrition, severe    Results:   Labs: Results for orders placed during the hospital encounter of 06/12/14 (from the past 48 hour(s))  GLUCOSE, CAPILLARY     Status: Abnormal   Collection Time    06/15/14 11:44 AM      Result Value Ref Range   Glucose-Capillary 159 (*) 70 - 99 mg/dL  GLUCOSE, CAPILLARY     Status: Abnormal   Collection Time    06/15/14  4:51 PM      Result Value Ref Range   Glucose-Capillary 163 (*) 70 - 99 mg/dL  GLUCOSE,  CAPILLARY     Status: Abnormal   Collection Time    06/15/14  9:56 PM      Result Value Ref Range   Glucose-Capillary 178 (*) 70 - 99 mg/dL  BODY FLUID CELL COUNT WITH DIFFERENTIAL     Status: Abnormal   Collection Time    06/15/14 10:16 PM      Result Value Ref Range   Fluid Type-FCT PERITONEAL     Comment: FLUID     PERITONEAL CAVITY     CORRECTED ON 09/08 AT 2258: PREVIOUSLY REPORTED AS PERITONEAL CAVITY   Color, Fluid STRAW (*) YELLOW   Appearance, Fluid CLOUDY (*) CLEAR   WBC, Fluid 2476 (*) 0 - 1000 cu mm   Neutrophil Count, Fluid 85 (*) 0 - 25 %   Lymphs, Fluid 0     Monocyte-Macrophage-Serous Fluid 15 (*) 50 - 90 %   Eos, Fluid 0    BODY FLUID CULTURE     Status: None   Collection Time    06/15/14 10:16 PM      Result Value Ref Range   Specimen Description PERITONEAL FLUID PERITONEAL CAVITY     Special Requests Immunocompromised     Gram Stain       Value:  MODERATE WBC PRESENT,BOTH PMN AND MONONUCLEAR     NO ORGANISMS SEEN     Performed at [facility]   Culture PENDING     Report Status PENDING    PATHOLOGIST SMEAR REVIEW     Status: None   Collection Time    06/15/14 10:16 PM      Result Value Ref Range   Path Review ABUNDANT INFLAMMATORY CELLS,     Comment: PREDOMINANTLY NEUTROPHILS.     Reviewed by Lennox Solders Lyndon Code, M.D.     06/16/14.  RENAL FUNCTION PANEL     Status: Abnormal   Collection Time    06/16/14  6:10 AM      Result Value Ref Range   Sodium 139  137 - 147 mEq/L   Potassium 4.6  3.7 - 5.3 mEq/L   Chloride 96  96 - 112 mEq/L   CO2 18 (*) 19 - 32 mEq/L   Glucose, Bld 149 (*) 70 - 99 mg/dL   BUN 137 (*) 6 - 23 mg/dL   Creatinine, Ser 17.49 (*) 0.50 - 1.35 mg/dL   Calcium 8.0 (*) 8.4 - 10.5 mg/dL   Phosphorus 10.3 (*) 2.3 - 4.6 mg/dL   Albumin 3.0 (*) 3.5 - 5.2 g/dL   GFR calc non Af Amer 3 (*) >90 mL/min   GFR calc Af Amer 3 (*) >90 mL/min   Comment: (NOTE)     The eGFR has been calculated using the CKD EPI equation.     This  calculation has not been validated in all clinical situations.     eGFR's persistently <90 mL/min signify possible Chronic Kidney     Disease.   Anion gap 25 (*) 5 - 15  IRON AND TIBC     Status: Abnormal   Collection Time    06/16/14  6:10 AM      Result Value Ref Range   Iron 63  42 - 135 ug/dL   TIBC 186 (*) 215 - 435 ug/dL   Saturation Ratios 34  20 - 55 %   UIBC 123 (*) 125 - 400 ug/dL   Comment: Performed at [facility]     Status: Abnormal   Collection Time    06/16/14  6:10 AM      Result Value Ref Range   Ferritin 611 (*) 22 - 322 ng/mL   Comment: Performed at [place], CAPILLARY     Status: Abnormal   Collection Time    06/16/14  7:43 AM      Result Value Ref Range   Glucose-Capillary 143 (*) 70 - 99 mg/dL  GLUCOSE, CAPILLARY     Status: Abnormal   Collection Time    06/16/14 10:39 AM      Result Value Ref Range   Glucose-Capillary 126 (*) 70 - 99 mg/dL  GLUCOSE, CAPILLARY     Status: Abnormal   Collection Time    06/16/14  1:00 PM      Result Value Ref Range   Glucose-Capillary 133 (*) 70 - 99 mg/dL   Comment 1 Notify RN    GLUCOSE, CAPILLARY     Status: Abnormal   Collection Time    06/16/14  5:07 PM      Result Value Ref Range   Glucose-Capillary 136 (*) 70 - 99 mg/dL  GLUCOSE, CAPILLARY     Status: Abnormal   Collection Time    06/16/14  6:46 PM      Result  Value Ref Range   Glucose-Capillary 172 (*) 70 - 99 mg/dL  GLUCOSE, CAPILLARY     Status: Abnormal   Collection Time    06/17/14  7:58 AM      Result Value Ref Range   Glucose-Capillary 115 (*) 70 - 99 mg/dL    Imaging / Studies: No results found.  Medications / Allergies:  Scheduled Meds: . aspirin EC  81 mg Oral Daily  . atorvastatin  40 mg Oral q1800  . calcium carbonate  800 mg of elemental calcium Oral TID WC  . cefTAZidime  1,500 mg Intraperitoneal Daily  . cefTAZidime  2,000 mg Intraperitoneal Once in dialysis  . cholecalciferol  4,000 Units  Oral Daily  . darbepoetin (ARANESP) injection - DIALYSIS  40 mcg Intravenous Q Thu-HD  . docusate sodium  100 mg Oral QPM  . ezetimibe  10 mg Oral QPM  . feeding supplement (NEPRO CARB STEADY)  237 mL Oral BID BM  . hydrALAZINE  100 mg Oral BID  . insulin aspart  0-20 Units Subcutaneous TID WC  . insulin glargine  11 Units Subcutaneous Daily  . ketorolac  7.5 mg Intravenous 4 times per day  . levothyroxine  100 mcg Oral QAC breakfast  . nebivolol  15 mg Oral q morning - 10a  . sodium chloride  3 mL Intravenous Q12H  . vancomycin  1,500 mg Intraperitoneal Once in dialysis  . vitamin E  400 Units Oral q morning - 10a   Continuous Infusions: . sodium chloride 10 mL/hr at 06/16/14 1147  . dialysis solution 2.5% low-MG    . heparin     PRN Meds:.acetaminophen, acetaminophen, camphor-menthol, docusate sodium, fentaNYL, heparin, hydrOXYzine, ondansetron (ZOFRAN) IV, ondansetron, oxyCODONE-acetaminophen, promethazine, sorbitol, traMADol  Antibiotics: Anti-infectives   Start     Dose/Rate Route Frequency Ordered Stop   06/17/14 1000  cefTAZidime (FORTAZ) 200 mg/mL injection 1,500 mg     1,500 mg Intraperitoneal Daily 06/16/14 1202     06/16/14 1200  vancomycin (VANCOCIN) 100 mg/mL injection 1,500 mg     1,500 mg Intraperitoneal Once in dialysis 06/16/14 1108     06/16/14 1200  cefTAZidime (FORTAZ) 200 mg/mL injection 2,000 mg     2,000 mg Intraperitoneal Once in dialysis 06/16/14 1129     06/16/14 1130  cefTAZidime (FORTAZ) 200 mg/mL injection 1,000 mg  Status:  Discontinued     1,000 mg Intraperitoneal Once in dialysis 06/16/14 1125 06/16/14 1128   06/16/14 1115  cefTAZidime (FORTAZ) 200 mg/mL injection 2,000 mg  Status:  Discontinued     2,000 mg Intraperitoneal Once in dialysis 06/16/14 1108 06/16/14 1123   06/16/14 0915  [MAR Hold]  ceFAZolin (ANCEF) IVPB 2 g/50 mL premix     (On MAR Hold since 06/16/14 1054)  Comments:  Pharmacy may adjust dosing strength, interval, or rate of  medication as needed for optimal therapy for the patient  Send with patient on call to the OR.  Anesthesia to complete antibiotic administration <57min prior to incision per [facility].   2 g 100 mL/hr over 30 Minutes Intravenous On call to O.R. 06/16/14 0911 06/16/14 1204        Assessment/Plan POD#1 Laparoscopic repositioning CAPD catheter--Dr. Rosendo Gros  -unfortunately, began malfunctioning once again.  Will make Dr. Rosendo Gros aware.  Per Dr. Moshe Cipro initiating HD today.   [name], [facility] Pager [phone]) For consults and floor pages call [phone](7A-4:30P)  06/17/2014 9:11 AM

## 2014-06-17 NOTE — Progress Notes (Signed)
Patient ID: Robert [name], male   DOB: [birth year], 53 y.o.   MRN: [id number]   Request has been made for possible HD catheter placement in IR  Order states place only if repositioning of PD catheter is unsuccessful. Per notes procedure seems as if WAS successful.  Please let IR know if need to move forward with perm cath Call [phone]  Thanks

## 2014-06-17 NOTE — Progress Notes (Signed)
Patient c/o pressure. Patient states unsure if it peritoneal fluid that wasn't able to drained or if it is his bladder. Bladder scan resulted with >982ml. Paged Dr. Augustin Coupe. Awaiting for return phone call. Will continue to monitor.

## 2014-06-17 NOTE — Consult Note (Signed)
Agree.  For tunneled HD catheter placement today.

## 2014-06-17 NOTE — Progress Notes (Signed)
Patient alert. In and out catherization performed. Output of 1600cc clear, yellow urine. MD notified. New orders received. Will continue to monitor.

## 2014-06-17 NOTE — Progress Notes (Signed)
Patient ID: Robert [name], male   DOB: [birth year], 53 y.o.   MRN: [id number]     Subjective:  Patient reports pain as mild to moderate.  Patient states that his shoulder is better but still painful.  Objective:   VITALS:   Filed Vitals:   06/16/14 1345 06/16/14 1410 06/16/14 2154 06/17/14 0458  BP: 141/80 194/87 143/86 150/70  Pulse: 63 67 74 76  Temp: 97.6 F (36.4 C) 97.7 F (36.5 C) 98.2 F (36.8 C) 98.1 F (36.7 C)  TempSrc:  Oral Oral Oral  Resp: 13 17 18 19   Height:      Weight:      SpO2: 98% 97% 98% 95%    ABD soft Sensation intact distally Dorsiflexion/Plantar flexion intact   Lab Results  Component Value Date   WBC 7.6 06/14/2014   HGB 8.8* 06/14/2014   HCT 26.6* 06/14/2014   MCV 87.8 06/14/2014   PLT 279 06/14/2014     Assessment/Plan: 1 Day Post-Op   Active Problems:   H/O unilateral nephrectomy   Type II or unspecified type diabetes mellitus with renal manifestations, uncontrolled   SVT (supraventricular tachycardia)   Severe obstructive sleep apnea   Hypothyroidism   Hyperlipidemia with target LDL less than 70   Chronic kidney disease, stage IV (severe)   Uremia of renal origin   Protein-calorie malnutrition, severe   Advance diet Up with therapy Continue plan per medicine Right shoulder injected with subacromial injection Patient tolerated the procedure well Patient should follow up in the clinic PRN   Robert [name] 06/17/2014, 9:48 AM   Edmonia Lynch MD [phone]

## 2014-06-17 NOTE — Progress Notes (Signed)
No further peritoneal drain from patient via [facility],attempted a manual drain,waited for one hour but no return. Sharone Almond, [name], Therapist, sports

## 2014-06-17 NOTE — Progress Notes (Signed)
Received call back from Dr. Ruthe Mannan to do in and out catheterization.Will continue to monitor. Robert [name], [name], Therapist, sports

## 2014-06-18 LAB — VANCOMYCIN, RANDOM: Vancomycin Rm: 26.6 ug/mL

## 2014-06-18 LAB — GLUCOSE, CAPILLARY
Glucose-Capillary: 140 mg/dL — ABNORMAL HIGH (ref 70–99)
Glucose-Capillary: 139 mg/dL — ABNORMAL HIGH (ref 70–99)
Glucose-Capillary: 120 mg/dL — ABNORMAL HIGH (ref 70–99)
Glucose-Capillary: 111 mg/dL — ABNORMAL HIGH (ref 70–99)
Glucose-Capillary: 178 mg/dL — ABNORMAL HIGH (ref 70–99)
Glucose-Capillary: 116 mg/dL — ABNORMAL HIGH (ref 70–99)

## 2014-06-18 LAB — RENAL FUNCTION PANEL
Albumin: 2.8 g/dL — ABNORMAL LOW (ref 3.5–5.2)
Anion gap: 20 — ABNORMAL HIGH (ref 5–15)
BUN: 106 mg/dL — ABNORMAL HIGH (ref 6–23)
CHLORIDE: 98 meq/L (ref 96–112)
CO2: 21 meq/L (ref 19–32)
CREATININE: 15.02 mg/dL — AB (ref 0.50–1.35)
Calcium: 8.8 mg/dL (ref 8.4–10.5)
GFR calc Af Amer: 4 mL/min — ABNORMAL LOW (ref >90)
GFR calc non Af Amer: 3 mL/min — ABNORMAL LOW (ref >90)
Glucose, Bld: 124 mg/dL — ABNORMAL HIGH (ref 70–99)
Phosphorus: 8.1 mg/dL — ABNORMAL HIGH (ref 2.3–4.6)
Potassium: 4.6 mEq/L (ref 3.7–5.3)
Sodium: 139 mEq/L (ref 137–147)

## 2014-06-18 LAB — CBC
HEMATOCRIT: 21 % — AB (ref 39.0–52.0)
Hemoglobin: 7 g/dL — ABNORMAL LOW (ref 13.0–17.0)
MCH: 29.4 pg (ref 26.0–34.0)
MCHC: 33.3 g/dL (ref 30.0–36.0)
MCV: 88.2 fL (ref 78.0–100.0)
Platelets: 182 10*3/uL (ref 150–400)
RBC: 2.38 MIL/uL — AB (ref 4.22–5.81)
RDW: 14.3 % (ref 11.5–15.5)
WBC: 8.1 10*3/uL (ref 4.0–10.5)

## 2014-06-18 LAB — HEPATITIS B CORE ANTIBODY, TOTAL: Hep B Core Total Ab: NONREACTIVE

## 2014-06-18 LAB — HEPATITIS B SURFACE ANTIBODY,QUALITATIVE: HEP B S AB: NEGATIVE

## 2014-06-18 LAB — HEPATITIS B SURFACE ANTIGEN: HEP B S AG: NEGATIVE

## 2014-06-18 MED ORDER — SORBITOL 70 % SOLN
30.0000 mL | Freq: Three times a day (TID) | Status: DC
  Administered 2014-06-18 – 2014-06-19 (×4): 30 mL via ORAL
  Filled 2014-06-18 (×6): qty 30

## 2014-06-18 MED ORDER — NEPRO/CARBSTEADY PO LIQD
237.0000 mL | ORAL | Status: DC | PRN
  Filled 2014-06-18: qty 237

## 2014-06-18 MED ORDER — INFLUENZA VAC SPLIT QUAD 0.5 ML IM SUSY
0.5000 mL | PREFILLED_SYRINGE | INTRAMUSCULAR | Status: AC
  Administered 2014-06-19: 0.5 mL via INTRAMUSCULAR
  Filled 2014-06-18: qty 0.5

## 2014-06-18 MED ORDER — PENTAFLUOROPROP-TETRAFLUOROETH EX AERO: 1.0000 "application " | INHALATION_SPRAY | CUTANEOUS | Status: DC | PRN

## 2014-06-18 MED ORDER — ALTEPLASE 2 MG IJ SOLR
2.0000 mg | Freq: Once | INTRAMUSCULAR | Status: DC | PRN
  Filled 2014-06-18: qty 2

## 2014-06-18 MED ORDER — ALPRAZOLAM 0.5 MG PO TABS
ORAL_TABLET | ORAL | Status: AC
  Filled 2014-06-18: qty 1

## 2014-06-18 MED ORDER — ALPRAZOLAM 0.5 MG PO TABS
0.5000 mg | ORAL_TABLET | Freq: Once | ORAL | Status: AC
  Administered 2014-06-18: 0.5 mg via ORAL

## 2014-06-18 MED ORDER — HEPARIN SODIUM (PORCINE) 1000 UNIT/ML DIALYSIS: 20.0000 [IU]/kg | INTRAMUSCULAR | Status: DC | PRN

## 2014-06-18 MED ORDER — SODIUM CHLORIDE 0.9 % IV SOLN: 100.0000 mL | INTRAVENOUS | Status: DC | PRN

## 2014-06-18 MED ORDER — LIDOCAINE-PRILOCAINE 2.5-2.5 % EX CREA
1.0000 "application " | TOPICAL_CREAM | CUTANEOUS | Status: DC | PRN
  Filled 2014-06-18: qty 5

## 2014-06-18 MED ORDER — LIDOCAINE HCL (PF) 1 % IJ SOLN: 5.0000 mL | INTRAMUSCULAR | Status: DC | PRN

## 2014-06-18 MED ORDER — HEPARIN SODIUM (PORCINE) 1000 UNIT/ML DIALYSIS: 1000.0000 [IU] | INTRAMUSCULAR | Status: DC | PRN

## 2014-06-18 NOTE — Progress Notes (Signed)
Patient ID: Robert [name], male   DOB: March 10, 1961, 53 y.o.   MRN: [id number]  Spoke with Dr. Moshe Cipro.  Pt on HD now.  Has not completely given up on PD cath.  We will have the patient follow up with Dr. Rosendo Gros on outpatient basis.  We will sign off.  Please call [facility] with any questions or concerns.   Tildon Silveria, ANP-BC Pager [phone](7A-4:30P)

## 2014-06-18 NOTE — Procedures (Signed)
Patient was seen on dialysis and the procedure was supervised.  BFR 300  Via PC BP is  193/95.   Patient appears to be tolerating treatment well  Deriona Altemose A 06/18/2014

## 2014-06-18 NOTE — Progress Notes (Signed)
I have seen and examined the patient and agree with the assessment and plans.  Dayannara Pascal A. Maricia Scotti  MD, FACS  

## 2014-06-18 NOTE — Progress Notes (Signed)
I participated in the care of this patient and agree with the above history, physical and evaluation. I performed a review of the history and a physical exam as detailed   Khush Pasion Daniel Francia Verry MD  

## 2014-06-18 NOTE — Evaluation (Signed)
Physical Therapy Evaluation Patient Details Name: Robert [name] MRN: [id number] DOB: [birth year] Today's Date: 06/18/2014   History of Present Illness  Patient is a 53 y/o male with CKD secondary to DM in setting of single kidney now presents with uremic type symptoms. Pt has a PD cath in place (flushed 3 times without problems) placed  05/18/14 and had plans to start op pd at [facility] on Wed 05/18/14 per pt. Pt reports progressive nausea (no food for at least 3 days), bad taste in mouth and vomiting. S/p fall on right shoulder at home. No fx. Now on HD.   Clinical Impression  Patient presents with functional limitations due to deficits listed in PT problem list (see below). Pt with generalized weakness from hospitalization and mild balance impairments. Encouraged daily mobility within room. Pt s/p right shoulder cortisone injection which relieved all pain. Reluctant to mobilize RUE during session. Pt would benefit from skilled acute PT to improve endurance, gait and overall mobility so pt can maximize independence and return to PLOF.    Follow Up Recommendations No PT follow up;Supervision for mobility/OOB    Equipment Recommendations  None recommended by PT    Recommendations for Other Services       Precautions / Restrictions Precautions Precautions: None Restrictions Weight Bearing Restrictions: No      Mobility  Bed Mobility Overal bed mobility: Needs Assistance;Modified Independent Bed Mobility: Supine to Sit;Sit to Supine     Supine to sit: Modified independent (Device/Increase time) Sit to supine: Modified independent (Device/Increase time)   General bed mobility comments: Use of rail, HOB elevated.  Transfers Overall transfer level: Modified independent   Transfers: Sit to/from Stand Sit to Stand: Supervision         General transfer comment: Required need to stand for 1 minute to gain balance/"footing" per pt report. Transferred on/off toilet with grab  bar.  Ambulation/Gait Ambulation/Gait assistance: Supervision Ambulation Distance (Feet): 200 Feet Assistive device: None Gait Pattern/deviations: Step-through pattern;Decreased stride length Gait velocity: decreased   General Gait Details: Slow, steady gait with multiple short standing rest breaks. Holding onto rail for support at times. Sa02 remained >90% on RA. No dyspnea present. Ambulated to bathroom with foley catheter to assess safety for within room ambulation.  Stairs            Wheelchair Mobility    Modified Rankin (Stroke Patients Only)       Balance Overall balance assessment: History of Falls;Needs assistance   Sitting balance-Leahy Scale: Good       Standing balance-Leahy Scale: Fair                               Pertinent Vitals/Pain Pain Assessment: No/denies pain (s/p cortisone shot in Right shoulder on 8/10 and immediate relief with no pain.)    Home Living Family/patient expects to be discharged to:: Private residence Living Arrangements: Spouse/significant other Available Help at Discharge: Family;Available PRN/intermittently Type of Home: House Home Access: Stairs to enter Entrance Stairs-Rails: None Entrance Stairs-Number of Steps: 3 Home Layout: Two level Home Equipment: None      Prior Function Level of Independence: Independent         Comments: Works as Arboriculturist.     Hand Dominance   Dominant Hand: Right    Extremity/Trunk Assessment   Upper Extremity Assessment: RUE deficits/detail RUE Deficits / Details: Pt with limited AROM shoulder elevation self limited due to fear of  pain. Pt reports not wanting to use it to cause pain. RUE: Unable to fully assess due to pain       Lower Extremity Assessment: [name] deficits/detail;LLE deficits/detail;Generalized weakness         Communication   Communication: No difficulties  Cognition Arousal/Alertness: Awake/alert Behavior During Therapy: WFL for  tasks assessed/performed Overall Cognitive Status: Within Functional Limits for tasks assessed                      General Comments      Exercises        Assessment/Plan    PT Assessment Patient needs continued PT services  PT Diagnosis Difficulty walking;Generalized weakness   PT Problem List Decreased activity tolerance;Decreased balance;Decreased mobility;Decreased strength  PT Treatment Interventions Balance training;Gait training;Stair training;Patient/family education;Functional mobility training;Therapeutic activities;Therapeutic exercise   PT Goals (Current goals can be found in the Care Plan section) Acute Rehab PT Goals Patient Stated Goal: to get better PT Goal Formulation: With patient Time For Goal Achievement: 07/02/14 Potential to Achieve Goals: Good    Frequency Min 3X/week   Barriers to discharge        Co-evaluation               End of Session Equipment Utilized During Treatment: Gait belt Activity Tolerance: Patient limited by fatigue Patient left: in bed;with call bell/phone within reach Nurse Communication: Mobility status         Time: [id number] PT Time Calculation (min): 25 min   Charges:   PT Evaluation $Initial PT Evaluation Tier I: 1 Procedure PT Treatments $Gait Training: 8-22 mins   PT G CodesCandy Sledge A 06/18/2014, 4:57 PM Candy Sledge, [place], DPT [phone]

## 2014-06-18 NOTE — Progress Notes (Signed)
Subjective:  Pt with s/p PC yest followed by HD- on second HD treatment currently- very anxious- still seeming to make urine actually had 2400 out- foley in Objective Vital signs in last 24 hours: Filed Vitals:   06/18/14 0830 06/18/14 0838 06/18/14 0900 06/18/14 0930  BP: 192/104 201/104 189/73 190/101  Pulse: 74 71 79 73  Temp: 98.7 F (37.1 C)     TempSrc: Oral     Resp: 16     Height:      Weight: 139.4 kg (307 lb 5.1 oz)     SpO2: 98%      Weight change:   Intake/Output Summary (Last 24 hours) at 06/18/14 1008 Last data filed at 06/18/14 0617  Gross per 24 hour  Intake     80 ml  Output   1783 ml  Net  -1703 ml    Assessment/ Plan: Pt is a 53 y.o. yo male who was admitted on 06/12/2014 with uremia needing initiation of dialysis as well as trauma to right shoulder  Assessment/Plan: 1. New ESRD- PD catheter not functional now for several days- has failed aggressive bowel regimen and TPA as well as OR intervention. Patient is uremic and I felt the best course of action at this time is to place a PC and get his uremia better. He is on his second HD treatment and will get third tomorrow. I am not completely convinced that this PD catheter will never work but need to get patient clinically better so that we can deal with it.  Continue with bowel regimen and urine drainage. Will try again tomorrow.  Worse case scenario send out on hemo and get PD cath working as OP- pt thinks he is going back to work soon so wants 3rd shift- not sure that will happen but I will inquire 2. Shoulder pain- x ray did not show any bony abnormality- sprain of AC joint- to get injection - stay without sling- appreciate ortho assist 3. Anemia- significant and predated admission- on aranesp- iron stores OK- transfuse tomorrow with HD 4. Secondary hyperparathyroidism- high phos, low calc- have started tums- will fine tune when not so constipated- hemo will help- PTH 186- no vit D 5. HTN/volume- overloaded- should  resolve when we are able to do dialysis 6. OSA- needs CPAP 7. DM- reasonable on current regimen 8. Presumed peritonitis with elevated WBC in PD fluid- gram stain negative- culture still pending- covering with vanc and fortaz- now will be dosed IV per pharmacy 9. Urinary retention- maybe due to illness/pain meds- pt now with foley catheter- is his distended bladder causing mechanical issue with PD cath ?   Guiseppe Flanagan A    Labs: Basic Metabolic Panel:  Recent Labs Lab 06/16/14 0610 06/17/14 2004 06/18/14 0748  NA 139 141 139  K 4.6 4.7 4.6  CL 96 99 98  CO2 18* 18* 21  GLUCOSE 149* 115* 124*  BUN 137* 133* 106*  CREATININE 17.49* 18.25* 15.02*  CALCIUM 8.0* 8.0* 8.8  PHOS 10.3* 9.8* 8.1*   Liver Function Tests:  Recent Labs Lab 06/12/14 0840 06/12/14 1353 06/13/14 0846  06/16/14 0610 06/17/14 2004 06/18/14 0748  AST 11 10 11   --   --   --   --   ALT 12 11 10   --   --   --   --   ALKPHOS 57 53 52  --   --   --   --   BILITOT 0.3 0.2* 0.3  --   --   --   --  PROT 7.1 6.8 6.6  --   --   --   --   ALBUMIN 3.3* 3.2* 3.0*  < > 3.0* 3.0* 2.8*  < > = values in this interval not displayed.  Recent Labs Lab 06/12/14 0840  LIPASE 95*   No results found for this basename: AMMONIA,  in the last 168 hours CBC:  Recent Labs Lab 06/12/14 0840 06/13/14 0415 06/14/14 0530 06/17/14 1242 06/17/14 2004 06/18/14 0748  WBC 8.8 7.4 7.6 8.3 8.9 8.1  NEUTROABS 7.0  --   --   --   --   --   HGB 8.5* 8.2* 8.8* 7.4* 7.4* 7.0*  HCT 25.4* 24.2* 26.6* 22.5* 23.0* 21.0*  MCV 86.4 87.4 87.8 86.5 86.8 88.2  PLT 252 228 279 207 210 182   Cardiac Enzymes: No results found for this basename: CKTOTAL, CKMB, CKMBINDEX, TROPONINI,  in the last 168 hours CBG:  Recent Labs Lab 06/17/14 0758 06/17/14 1158 06/17/14 1721 06/17/14 2320 06/18/14 0750  GLUCAP 115* 109* 115* 161* 120*    Iron Studies:   Recent Labs  06/16/14 0610  IRON 63  TIBC 186*  FERRITIN 611*    Studies/Results: Ir Fluoro Guide Cv Line Right  06/17/2014   CLINICAL DATA:  End-stage renal disease and need for hemodialysis catheter due to non functional peritoneal dialysis catheter.  EXAM: TUNNELED CENTRAL VENOUS HEMODIALYSIS CATHETER PLACEMENT WITH ULTRASOUND AND FLUOROSCOPIC GUIDANCE  ANESTHESIA/SEDATION: 2.0 mg IV Versed; 50 mcg IV Fentanyl.  Total Moderate Sedation Time  30 min.  MEDICATIONS: Scheduled IV Fortaz and Vancomycin were administered just prior to the procedure.  FLUOROSCOPY TIME:  48 seconds.  PROCEDURE: The procedure, risks, benefits, and alternatives were explained to the patient. Questions regarding the procedure were encouraged and answered. The patient understands and consents to the procedure.  The right neck and chest were prepped with chlorhexidine in a sterile fashion, and a sterile drape was applied covering the operative field. Maximum barrier sterile technique with sterile gowns and gloves were used for the procedure. Local anesthesia was provided with 1% lidocaine.  Ultrasound was used to confirm patency of the right internal jugular vein. After creating a small venotomy incision, a 21 gauge needle was advanced into the right internal jugular vein under direct, real-time ultrasound guidance. Ultrasound image documentation was performed. After securing guidewire access, an 8 Fr dilator was placed. A J-wire was kinked to measure appropriate catheter length.  A Bard Hemosplit tunneled hemodialysis catheter measuring 23 cm from tip to cuff was chosen for placement. This was tunneled in a retrograde fashion from the chest wall to the venotomy incision.  At the venotomy, serial dilatation was performed and a 16 Fr peel-away sheath was placed over a guidewire. The catheter was then placed through the sheath and the sheath removed. Final catheter positioning was confirmed and documented with a fluoroscopic spot image. The catheter was aspirated, flushed with saline, and injected with  appropriate volume heparin dwells.  The venotomy incision was closed with subcutaneous 3-0 Monocryl and subcuticular 4-0 Vicryl. Dermabond was applied to the incision. The catheter exit site was secured with 0-Prolene retention sutures.  COMPLICATIONS: None.  No pneumothorax.  FINDINGS: After catheter placement, the tips lie in the right atrium. The catheter aspirates normally and is ready for immediate use.  IMPRESSION: Placement of tunneled hemodialysis catheter via the right internal jugular vein. The catheter tips lie in the right atrium. The catheter is ready for immediate use.   Electronically Signed   By:  Aletta Edouard M.D.   On: 06/17/2014 17:12   Ir US Guide Vasc Access Right  06/17/2014   CLINICAL DATA:  End-stage renal disease and need for hemodialysis catheter due to non functional peritoneal dialysis catheter.  EXAM: TUNNELED CENTRAL VENOUS HEMODIALYSIS CATHETER PLACEMENT WITH ULTRASOUND AND FLUOROSCOPIC GUIDANCE  ANESTHESIA/SEDATION: 2.0 mg IV Versed; 50 mcg IV Fentanyl.  Total Moderate Sedation Time  30 min.  MEDICATIONS: Scheduled IV Fortaz and Vancomycin were administered just prior to the procedure.  FLUOROSCOPY TIME:  48 seconds.  PROCEDURE: The procedure, risks, benefits, and alternatives were explained to the patient. Questions regarding the procedure were encouraged and answered. The patient understands and consents to the procedure.  The right neck and chest were prepped with chlorhexidine in a sterile fashion, and a sterile drape was applied covering the operative field. Maximum barrier sterile technique with sterile gowns and gloves were used for the procedure. Local anesthesia was provided with 1% lidocaine.  Ultrasound was used to confirm patency of the right internal jugular vein. After creating a small venotomy incision, a 21 gauge needle was advanced into the right internal jugular vein under direct, real-time ultrasound guidance. Ultrasound image documentation was performed. After  securing guidewire access, an 8 Fr dilator was placed. A J-wire was kinked to measure appropriate catheter length.  A Bard Hemosplit tunneled hemodialysis catheter measuring 23 cm from tip to cuff was chosen for placement. This was tunneled in a retrograde fashion from the chest wall to the venotomy incision.  At the venotomy, serial dilatation was performed and a 16 Fr peel-away sheath was placed over a guidewire. The catheter was then placed through the sheath and the sheath removed. Final catheter positioning was confirmed and documented with a fluoroscopic spot image. The catheter was aspirated, flushed with saline, and injected with appropriate volume heparin dwells.  The venotomy incision was closed with subcutaneous 3-0 Monocryl and subcuticular 4-0 Vicryl. Dermabond was applied to the incision. The catheter exit site was secured with 0-Prolene retention sutures.  COMPLICATIONS: None.  No pneumothorax.  FINDINGS: After catheter placement, the tips lie in the right atrium. The catheter aspirates normally and is ready for immediate use.  IMPRESSION: Placement of tunneled hemodialysis catheter via the right internal jugular vein. The catheter tips lie in the right atrium. The catheter is ready for immediate use.   Electronically Signed   By: Aletta Edouard M.D.   On: 06/17/2014 17:12   Medications: Infusions: . sodium chloride 10 mL/hr at 06/16/14 1147  . dialysis solution 2.5% low-MG    . heparin      Scheduled Medications: . ALPRAZolam      . aspirin EC  81 mg Oral Daily  . atorvastatin  40 mg Oral q1800  . calcium carbonate  800 mg of elemental calcium Oral TID WC  . cefTAZidime (FORTAZ)  IV  1 g Intravenous Q24H  . cholecalciferol  4,000 Units Oral Daily  . darbepoetin (ARANESP) injection - DIALYSIS  40 mcg Intravenous Q Thu-HD  . docusate sodium  100 mg Oral QPM  . ezetimibe  10 mg Oral QPM  . feeding supplement (NEPRO CARB STEADY)  237 mL Oral BID BM  . hydrALAZINE  100 mg Oral BID  .  [START ON 06/19/2014] Influenza vac split quadrivalent PF  0.5 mL Intramuscular Tomorrow-1000  . insulin aspart  0-20 Units Subcutaneous TID WC  . insulin glargine  11 Units Subcutaneous Daily  . ketorolac  7.5 mg Intravenous 4 times per day  .  levothyroxine  100 mcg Oral QAC breakfast  . nebivolol  15 mg Oral q morning - 10a  . sodium chloride  3 mL Intravenous Q12H  . vitamin E  400 Units Oral q morning - 10a    have reviewed scheduled and prn medications.  Physical Exam: General: lying in bed, feeling poorly Heart: RRR Lungs: mostly clear Abdomen: obese- distended Extremities: edema Dialysis Access: PD catheter and new PC placed 9/10   06/18/2014,10:08 AM  LOS: 6 days

## 2014-06-19 LAB — RENAL FUNCTION PANEL
Albumin: 2.9 g/dL — ABNORMAL LOW (ref 3.5–5.2)
Anion gap: 16 — ABNORMAL HIGH (ref 5–15)
BUN: 74 mg/dL — ABNORMAL HIGH (ref 6–23)
CO2: 25 meq/L (ref 19–32)
Calcium: 8.8 mg/dL (ref 8.4–10.5)
Chloride: 99 mEq/L (ref 96–112)
Creatinine, Ser: 11.12 mg/dL — ABNORMAL HIGH (ref 0.50–1.35)
GFR calc non Af Amer: 5 mL/min — ABNORMAL LOW (ref >90)
GFR, EST AFRICAN AMERICAN: 5 mL/min — AB (ref >90)
GLUCOSE: 175 mg/dL — AB (ref 70–99)
PHOSPHORUS: 6.1 mg/dL — AB (ref 2.3–4.6)
POTASSIUM: 4.1 meq/L (ref 3.7–5.3)
SODIUM: 140 meq/L (ref 137–147)

## 2014-06-19 LAB — BODY FLUID CULTURE: CULTURE: NO GROWTH

## 2014-06-19 LAB — CBC
HCT: 20.7 % — ABNORMAL LOW (ref 39.0–52.0)
Hemoglobin: 6.7 g/dL — CL (ref 13.0–17.0)
MCH: 28.3 pg (ref 26.0–34.0)
MCHC: 32.4 g/dL (ref 30.0–36.0)
MCV: 87.3 fL (ref 78.0–100.0)
Platelets: 171 10*3/uL (ref 150–400)
RBC: 2.37 MIL/uL — AB (ref 4.22–5.81)
RDW: 14.2 % (ref 11.5–15.5)
WBC: 5.7 10*3/uL (ref 4.0–10.5)

## 2014-06-19 LAB — ABO/RH: ABO/RH(D): A POS

## 2014-06-19 LAB — GLUCOSE, CAPILLARY
GLUCOSE-CAPILLARY: 151 mg/dL — AB (ref 70–99)
GLUCOSE-CAPILLARY: 101 mg/dL — AB (ref 70–99)
GLUCOSE-CAPILLARY: 178 mg/dL — AB (ref 70–99)
Glucose-Capillary: 137 mg/dL — ABNORMAL HIGH (ref 70–99)

## 2014-06-19 LAB — VANCOMYCIN, RANDOM: Vancomycin Rm: 15.7 ug/mL

## 2014-06-19 LAB — PREPARE RBC (CROSSMATCH)

## 2014-06-19 MED ORDER — HEPARIN SODIUM (PORCINE) 1000 UNIT/ML IJ SOLN
500.0000 [IU] | INTRAMUSCULAR | Status: AC
  Filled 2014-06-19 (×2): qty 0.5

## 2014-06-19 MED ORDER — TAMSULOSIN HCL 0.4 MG PO CAPS
0.4000 mg | ORAL_CAPSULE | Freq: Every day | ORAL | Status: DC
  Administered 2014-06-19 – 2014-06-21 (×3): 0.4 mg via ORAL
  Filled 2014-06-19 (×3): qty 1

## 2014-06-19 MED ORDER — HEPARIN SODIUM (PORCINE) 1000 UNIT/ML DIALYSIS: 20.0000 [IU]/kg | INTRAMUSCULAR | Status: DC | PRN

## 2014-06-19 MED ORDER — VANCOMYCIN HCL IN DEXTROSE 750-5 MG/150ML-% IV SOLN
750.0000 mg | Freq: Once | INTRAVENOUS | Status: AC
  Administered 2014-06-19: 750 mg via INTRAVENOUS
  Filled 2014-06-19: qty 150

## 2014-06-19 MED ORDER — SODIUM CHLORIDE 0.9 % IV SOLN: Freq: Once | INTRAVENOUS | Status: DC

## 2014-06-19 MED ORDER — VANCOMYCIN HCL IN DEXTROSE 1-5 GM/200ML-% IV SOLN
1000.0000 mg | Freq: Once | INTRAVENOUS | Status: DC
  Filled 2014-06-19: qty 200

## 2014-06-19 NOTE — Progress Notes (Signed)
Patient places themselves on CPAP. Instructed that if any issues arise to call RT and we would continue to assist as needed.

## 2014-06-19 NOTE — Progress Notes (Signed)
Unable to complete CAPD. Patient not filling. Repositioning, heparin and higher gravity did not work. Will update MD in am.

## 2014-06-19 NOTE — Progress Notes (Signed)
CRITICAL VALUE ALERT  Critical value received:  *6.7 Hgb  Date of notification:  06-19-14 Time of notification:  1422  Critical value read back:yes  Nurse who received alert:  Ronny Bacon, RN  MD notified (1st page):  Dr. Moshe Cipro already aware and order in place to give 2 units PRBC.  Time of first page:  N/A  MD notified (2nd page):n/a  Time of second page:n/a  Responding MD: Moshe Cipro  Time MD responded:  previous

## 2014-06-19 NOTE — Progress Notes (Signed)
ANTIBIOTIC CONSULT NOTE - FOLLOW UP  Pharmacy Consult for Vancomycin Indication: R/O peritonitis  No Known Allergies  Patient Measurements: Height: 6' (182.9 cm) Weight: 291 lb (131.997 kg) IBW/kg (Calculated) : 77.6 Adjusted Body Weight:   Vital Signs: Temp: 99.7 F (37.6 C) (09/12 2110) Temp src: Oral (09/12 1800) BP: 159/70 mmHg (09/12 2110) Pulse Rate: 75 (09/12 2110) Intake/Output from previous day: 09/11 0701 - 09/12 0700 In: 580 [P.O.:580] Out: 2500 [Urine:500] Intake/Output from this shift:    Labs:  Recent Labs  06/17/14 2004 06/18/14 0748 06/19/14 1358  WBC 8.9 8.1 5.7  HGB 7.4* 7.0* 6.7*  PLT 210 182 171  CREATININE 18.25* 15.02* 11.12*   Estimated Creatinine Clearance: 10.6 ml/min (by C-G formula based on Cr of 11.12).  Recent Labs  06/18/14 1645 06/19/14 1948  VANCORANDOM 26.6 15.7     Microbiology: Recent Results (from the past 720 hour(s))  BODY FLUID CULTURE     Status: None   Collection Time    06/15/14 10:16 PM      Result Value Ref Range Status   Specimen Description PERITONEAL FLUID PERITONEAL CAVITY   Final   Special Requests Immunocompromised   Final   Gram Stain     Final   Value: MODERATE WBC PRESENT,BOTH PMN AND MONONUCLEAR     NO ORGANISMS SEEN     Performed at [facility]   Culture     Final   Value: NO GROWTH 3 DAYS     Performed at [facility]   Report Status 06/19/2014 FINAL   Final    Anti-infectives   Start     Dose/Rate Route Frequency Ordered Stop   06/19/14 2130  vancomycin (VANCOCIN) IVPB 1000 mg/200 mL premix  Status:  Discontinued     1,000 mg 200 mL/hr over 60 Minutes Intravenous  Once 06/19/14 2115 06/19/14 2117   06/19/14 2130  vancomycin (VANCOCIN) IVPB 750 mg/150 ml premix     750 mg 150 mL/hr over 60 Minutes Intravenous  Once 06/19/14 2117     06/17/14 2000  cefTAZidime (FORTAZ) 1 g in dextrose 5 % 50 mL IVPB     1 g 100 mL/hr over 30 Minutes Intravenous Every 24 hours 06/17/14  1332     06/17/14 1800  vancomycin (VANCOCIN) IVPB 1000 mg/200 mL premix     1,000 mg 200 mL/hr over 60 Minutes Intravenous Every Thu (Hemodialysis) 06/17/14 1332 06/17/14 2237   06/17/14 1000  cefTAZidime (FORTAZ) 200 mg/mL injection 1,500 mg  Status:  Discontinued     1,500 mg Intraperitoneal Daily 06/16/14 1202 06/17/14 1309   06/17/14 1000  vancomycin (VANCOCIN) IVPB 1000 mg/200 mL premix    Comments:  Hang ON CALL to xray 9/10   1,000 mg 200 mL/hr over 60 Minutes Intravenous  Once 06/17/14 0950 06/17/14 1631   06/16/14 1200  vancomycin (VANCOCIN) 100 mg/mL injection 1,500 mg     1,500 mg Intraperitoneal Once in dialysis 06/16/14 1108 06/16/14 1630   06/16/14 1200  cefTAZidime (FORTAZ) 200 mg/mL injection 2,000 mg     2,000 mg Intraperitoneal Once in dialysis 06/16/14 1129 06/16/14 1630   06/16/14 1130  cefTAZidime (FORTAZ) 200 mg/mL injection 1,000 mg  Status:  Discontinued     1,000 mg Intraperitoneal Once in dialysis 06/16/14 1125 06/16/14 1128   06/16/14 1115  cefTAZidime (FORTAZ) 200 mg/mL injection 2,000 mg  Status:  Discontinued     2,000 mg Intraperitoneal Once in dialysis 06/16/14 1108 06/16/14 1123   06/16/14  0915  [MAR Hold]  ceFAZolin (ANCEF) IVPB 2 g/50 mL premix     (On MAR Hold since 06/16/14 1054)  Comments:  Pharmacy may adjust dosing strength, interval, or rate of medication as needed for optimal therapy for the patient  Send with patient on call to the OR.  Anesthesia to complete antibiotic administration <42min prior to incision per [facility]   2 g 100 mL/hr over 30 Minutes Intravenous On call to O.R. 06/16/14 0911 06/16/14 1204      Assessment: 53yo male with R/O peritonitis, now receiving HD.  Random level drawn 4hr after HD today was 15.7.  Peritoneal fluid culture is (-), final.  Goal of Therapy:  pre-HD level 15-25  Plan:  1-  Vancomycin 750mg  IV x 1 2-  F/U future HD tolerance and redose as indicated  Errol Ala P 06/19/2014,9:18 PM

## 2014-06-19 NOTE — Progress Notes (Signed)
Subjective:  Pt resting- s/p 2 HD treatments- aggressive bowel regimen is working- spoke to wife Objective Vital signs in last 24 hours: Filed Vitals:   06/18/14 1207 06/18/14 1800 06/18/14 2100 06/19/14 0610  BP: 163/79 160/88 166/78 148/79  Pulse: 77 78 80 75  Temp: 98.9 F (37.2 C) 98 F (36.7 C) 99.8 F (37.7 C) 98.8 F (37.1 C)  TempSrc: Oral Oral    Resp: 18 18 20 18   Height:      Weight:   135.626 kg (299 lb)   SpO2: 98% 99% 93% 94%   Weight change: 0.3 kg (10.6 oz)  Intake/Output Summary (Last 24 hours) at 06/19/14 0943 Last data filed at 06/18/14 2218  Gross per 24 hour  Intake    580 ml  Output   2500 ml  Net  -1920 ml    Assessment/ Plan: Pt is a 53 y.o. yo male who was admitted on 06/12/2014 with uremia needing initiation of dialysis as well as trauma to right shoulder  Assessment/Plan: 1. New ESRD- PD catheter not functional now for several days- has failed aggressive bowel regimen and TPA as well as OR intervention. Patient is uremic and I felt the best course of actionwas to place a PC and get his uremia better. He is s/p HD 9/10, 9/11 and third slated for [place]. I am not completely convinced that this PD catheter will never work but need to get patient clinically better so that we can deal with it.  Continue with bowel regimen and urine drainage. Will try again one more time today.  Worse case scenario send out on hemo and get PD cath working as OP- pt thinks he is going back to work as soon as discharge so wants 3rd shift- not sure that will happen but I will inquire-  2. Shoulder pain- x ray did not show any bony abnormality- sprain of AC joint- to get injection - stay without sling- appreciate ortho assist- is much better now- will stop narcotics- continue tramadol 3. Anemia- significant and predated admission- on aranesp- iron stores OK- transfuse today with HD 4. Secondary hyperparathyroidism- high phos, low calc- have started tums- will fine tune when not so  constipated- hemo will help- PTH 186- no vit D 5. HTN/volume- overloaded- improved when we are able to do dialysis- also on hydralazine and bystolic 6. OSA- needs CPAP- has his machine from home 7. DM- reasonable on current regimen 8. Presumed peritonitis with elevated WBC in PD fluid- gram stain negative- culture still pending- covering with vanc and fortaz- now will be dosed IV per pharmacy 9. Urinary retention- maybe due to illness/pain meds- pt now with foley catheter- is his distended bladder causing mechanical issue with PD cath ? Will start flomax- stop narcotics- remove foley and do voiding trial tomorrow  Chanee Henrickson A    Labs: Basic Metabolic Panel:  Recent Labs Lab 06/16/14 0610 06/17/14 2004 06/18/14 0748  NA 139 141 139  K 4.6 4.7 4.6  CL 96 99 98  CO2 18* 18* 21  GLUCOSE 149* 115* 124*  BUN 137* 133* 106*  CREATININE 17.49* 18.25* 15.02*  CALCIUM 8.0* 8.0* 8.8  PHOS 10.3* 9.8* 8.1*   Liver Function Tests:  Recent Labs Lab 06/12/14 1353 06/13/14 0846  06/16/14 0610 06/17/14 2004 06/18/14 0748  AST 10 11  --   --   --   --   ALT 11 10  --   --   --   --   Arabella Merles  53 52  --   --   --   --   BILITOT 0.2* 0.3  --   --   --   --   PROT 6.8 6.6  --   --   --   --   ALBUMIN 3.2* 3.0*  < > 3.0* 3.0* 2.8*  < > = values in this interval not displayed. No results found for this basename: LIPASE, AMYLASE,  in the last 168 hours No results found for this basename: AMMONIA,  in the last 168 hours CBC:  Recent Labs Lab 06/13/14 0415 06/14/14 0530 06/17/14 1242 06/17/14 2004 06/18/14 0748  WBC 7.4 7.6 8.3 8.9 8.1  HGB 8.2* 8.8* 7.4* 7.4* 7.0*  HCT 24.2* 26.6* 22.5* 23.0* 21.0*  MCV 87.4 87.8 86.5 86.8 88.2  PLT 228 279 207 210 182   Cardiac Enzymes: No results found for this basename: CKTOTAL, CKMB, CKMBINDEX, TROPONINI,  in the last 168 hours CBG:  Recent Labs Lab 06/18/14 0750 06/18/14 1205 06/18/14 1713 06/18/14 2054 06/19/14 0804   GLUCAP 120* 111* 178* 116* 137*    Iron Studies:  No results found for this basename: IRON, TIBC, TRANSFERRIN, FERRITIN,  in the last 72 hours Studies/Results: Ir Fluoro Guide Cv Line Right  06/17/2014   CLINICAL DATA:  End-stage renal disease and need for hemodialysis catheter due to non functional peritoneal dialysis catheter.  EXAM: TUNNELED CENTRAL VENOUS HEMODIALYSIS CATHETER PLACEMENT WITH ULTRASOUND AND FLUOROSCOPIC GUIDANCE  ANESTHESIA/SEDATION: 2.0 mg IV Versed; 50 mcg IV Fentanyl.  Total Moderate Sedation Time  30 min.  MEDICATIONS: Scheduled IV Fortaz and Vancomycin were administered just prior to the procedure.  FLUOROSCOPY TIME:  48 seconds.  PROCEDURE: The procedure, risks, benefits, and alternatives were explained to the patient. Questions regarding the procedure were encouraged and answered. The patient understands and consents to the procedure.  The right neck and chest were prepped with chlorhexidine in a sterile fashion, and a sterile drape was applied covering the operative field. Maximum barrier sterile technique with sterile gowns and gloves were used for the procedure. Local anesthesia was provided with 1% lidocaine.  Ultrasound was used to confirm patency of the right internal jugular vein. After creating a small venotomy incision, a 21 gauge needle was advanced into the right internal jugular vein under direct, real-time ultrasound guidance. Ultrasound image documentation was performed. After securing guidewire access, an 8 Fr dilator was placed. A J-wire was kinked to measure appropriate catheter length.  A Bard Hemosplit tunneled hemodialysis catheter measuring 23 cm from tip to cuff was chosen for placement. This was tunneled in a retrograde fashion from the chest wall to the venotomy incision.  At the venotomy, serial dilatation was performed and a 16 Fr peel-away sheath was placed over a guidewire. The catheter was then placed through the sheath and the sheath removed. Final  catheter positioning was confirmed and documented with a fluoroscopic spot image. The catheter was aspirated, flushed with saline, and injected with appropriate volume heparin dwells.  The venotomy incision was closed with subcutaneous 3-0 Monocryl and subcuticular 4-0 Vicryl. Dermabond was applied to the incision. The catheter exit site was secured with 0-Prolene retention sutures.  COMPLICATIONS: None.  No pneumothorax.  FINDINGS: After catheter placement, the tips lie in the right atrium. The catheter aspirates normally and is ready for immediate use.  IMPRESSION: Placement of tunneled hemodialysis catheter via the right internal jugular vein. The catheter tips lie in the right atrium. The catheter is ready for immediate use.  Electronically Signed   By: Aletta Edouard M.D.   On: 06/17/2014 17:12   Ir US Guide Vasc Access Right  06/17/2014   CLINICAL DATA:  End-stage renal disease and need for hemodialysis catheter due to non functional peritoneal dialysis catheter.  EXAM: TUNNELED CENTRAL VENOUS HEMODIALYSIS CATHETER PLACEMENT WITH ULTRASOUND AND FLUOROSCOPIC GUIDANCE  ANESTHESIA/SEDATION: 2.0 mg IV Versed; 50 mcg IV Fentanyl.  Total Moderate Sedation Time  30 min.  MEDICATIONS: Scheduled IV Fortaz and Vancomycin were administered just prior to the procedure.  FLUOROSCOPY TIME:  48 seconds.  PROCEDURE: The procedure, risks, benefits, and alternatives were explained to the patient. Questions regarding the procedure were encouraged and answered. The patient understands and consents to the procedure.  The right neck and chest were prepped with chlorhexidine in a sterile fashion, and a sterile drape was applied covering the operative field. Maximum barrier sterile technique with sterile gowns and gloves were used for the procedure. Local anesthesia was provided with 1% lidocaine.  Ultrasound was used to confirm patency of the right internal jugular vein. After creating a small venotomy incision, a 21 gauge  needle was advanced into the right internal jugular vein under direct, real-time ultrasound guidance. Ultrasound image documentation was performed. After securing guidewire access, an 8 Fr dilator was placed. A J-wire was kinked to measure appropriate catheter length.  A Bard Hemosplit tunneled hemodialysis catheter measuring 23 cm from tip to cuff was chosen for placement. This was tunneled in a retrograde fashion from the chest wall to the venotomy incision.  At the venotomy, serial dilatation was performed and a 16 Fr peel-away sheath was placed over a guidewire. The catheter was then placed through the sheath and the sheath removed. Final catheter positioning was confirmed and documented with a fluoroscopic spot image. The catheter was aspirated, flushed with saline, and injected with appropriate volume heparin dwells.  The venotomy incision was closed with subcutaneous 3-0 Monocryl and subcuticular 4-0 Vicryl. Dermabond was applied to the incision. The catheter exit site was secured with 0-Prolene retention sutures.  COMPLICATIONS: None.  No pneumothorax.  FINDINGS: After catheter placement, the tips lie in the right atrium. The catheter aspirates normally and is ready for immediate use.  IMPRESSION: Placement of tunneled hemodialysis catheter via the right internal jugular vein. The catheter tips lie in the right atrium. The catheter is ready for immediate use.   Electronically Signed   By: Aletta Edouard M.D.   On: 06/17/2014 17:12   Medications: Infusions: . sodium chloride 10 mL/hr at 06/16/14 1147  . dialysis solution 2.5% low-MG    . heparin      Scheduled Medications: . aspirin EC  81 mg Oral Daily  . atorvastatin  40 mg Oral q1800  . calcium carbonate  800 mg of elemental calcium Oral TID WC  . cefTAZidime (FORTAZ)  IV  1 g Intravenous Q24H  . cholecalciferol  4,000 Units Oral Daily  . darbepoetin (ARANESP) injection - DIALYSIS  40 mcg Intravenous Q Thu-HD  . docusate sodium  100 mg  Oral QPM  . ezetimibe  10 mg Oral QPM  . feeding supplement (NEPRO CARB STEADY)  237 mL Oral BID BM  . hydrALAZINE  100 mg Oral BID  . Influenza vac split quadrivalent PF  0.5 mL Intramuscular Tomorrow-1000  . insulin aspart  0-20 Units Subcutaneous TID WC  . insulin glargine  11 Units Subcutaneous Daily  . ketorolac  7.5 mg Intravenous 4 times per day  . levothyroxine  100 mcg  Oral QAC breakfast  . nebivolol  15 mg Oral q morning - 10a  . sodium chloride  3 mL Intravenous Q12H  . sorbitol  30 mL Oral TID  . vitamin E  400 Units Oral q morning - 10a    have reviewed scheduled and prn medications.  Physical Exam: General: lying in bed, feeling poorly Heart: RRR Lungs: mostly clear Abdomen: obese- distended Extremities: edema Dialysis Access: PD catheter and new PC placed 9/10   06/19/2014,9:43 AM  LOS: 7 days

## 2014-06-20 LAB — RENAL FUNCTION PANEL
Albumin: 3.1 g/dL — ABNORMAL LOW (ref 3.5–5.2)
Anion gap: 15 (ref 5–15)
BUN: 42 mg/dL — ABNORMAL HIGH (ref 6–23)
CHLORIDE: 96 meq/L (ref 96–112)
CO2: 28 meq/L (ref 19–32)
CREATININE: 7.61 mg/dL — AB (ref 0.50–1.35)
Calcium: 9.5 mg/dL (ref 8.4–10.5)
GFR calc non Af Amer: 7 mL/min — ABNORMAL LOW (ref >90)
GFR, EST AFRICAN AMERICAN: 8 mL/min — AB (ref >90)
Glucose, Bld: 202 mg/dL — ABNORMAL HIGH (ref 70–99)
Phosphorus: 4.3 mg/dL (ref 2.3–4.6)
Potassium: 3.8 mEq/L (ref 3.7–5.3)
Sodium: 139 mEq/L (ref 137–147)

## 2014-06-20 LAB — TYPE AND SCREEN
ABO/RH(D): A POS
ANTIBODY SCREEN: NEGATIVE
Unit division: 0
Unit division: 0

## 2014-06-20 LAB — CBC
HCT: 27.2 % — ABNORMAL LOW (ref 39.0–52.0)
HEMOGLOBIN: 9.1 g/dL — AB (ref 13.0–17.0)
MCH: 28.6 pg (ref 26.0–34.0)
MCHC: 33.5 g/dL (ref 30.0–36.0)
MCV: 85.5 fL (ref 78.0–100.0)
Platelets: 201 10*3/uL (ref 150–400)
RBC: 3.18 MIL/uL — AB (ref 4.22–5.81)
RDW: 13.8 % (ref 11.5–15.5)
WBC: 7.1 10*3/uL (ref 4.0–10.5)

## 2014-06-20 LAB — GLUCOSE, CAPILLARY
Glucose-Capillary: 144 mg/dL — ABNORMAL HIGH (ref 70–99)
Glucose-Capillary: 208 mg/dL — ABNORMAL HIGH (ref 70–99)
Glucose-Capillary: 101 mg/dL — ABNORMAL HIGH (ref 70–99)
Glucose-Capillary: 204 mg/dL — ABNORMAL HIGH (ref 70–99)

## 2014-06-20 NOTE — Progress Notes (Signed)
Subjective:  Pt sitting up working on laptop- s/p 3 HD treatments now- unfortunately even with aggressive bowel regimen PD cath not working.  Foley in place Objective Vital signs in last 24 hours: Filed Vitals:   06/19/14 1800 06/19/14 2110 06/20/14 0355 06/20/14 0951  BP: 190/101 159/70 151/86 160/81  Pulse: 65 75 68 68  Temp: 99.1 F (37.3 C) 99.7 F (37.6 C) 99 F (37.2 C) 98 F (36.7 C)  TempSrc: Oral   Oral  Resp: 18 18 20 20   Height:      Weight:  131.997 kg (291 lb)    SpO2:  93% 96% 95%   Weight change: -3.1 kg (-6 lb 13.4 oz)  Intake/Output Summary (Last 24 hours) at 06/20/14 0956 Last data filed at 06/20/14 0900  Gross per 24 hour  Intake    995 ml  Output   3261 ml  Net  -2266 ml    Assessment/ Plan: Pt is [name] 53 y.o. yo male who was admitted on 06/12/2014 with uremia needing initiation of dialysis as well as trauma to right shoulder  Assessment/Plan: 1. New ESRD- PD catheter not functional now for several days- has failed aggressive bowel regimen and TPA as well as OR intervention. Patient was uremic and I felt the best course of actionwas to place [name] PC and get his uremia better. He is s/p HD 9/10, 9/11 and third 9/12.  Doing much better clinically. I am going to send out on hemo and attempt to get PD cath working as OP- pt thinks he is going back to work as soon as discharge so wants 3rd shift- not sure that will happen but I will inquire-plan to do one more HD treatment tomorrow in AM, then discharge-  will need custom HD schedule this week- Wednesday and Thursday so can go to his daughters wedding next weekend (I will see if can do this at [place]) then work on more permanent HD schedule for the next week (3rd shift ?) with plans of sending patient to [facility] Dr. Vicente Masson to see if can get PD catheter to work so hopefully HD would only be 3 weeks or so but do not know- 2. Shoulder pain- x ray did not show any bony abnormality- sprain of AC joint- to get injection - stay  without sling- appreciate ortho assist- is much better now-  3. Anemia- significant and predated admission- on aranesp- iron stores OK- transfused 9/12 4. Secondary hyperparathyroidism- high phos, low calc- have started tums- will fine tune when not so constipated- hemo will help- PTH 186- no vit D 5. HTN/volume- overloaded- improved when we are able to do dialysis- also on hydralazine and bystolic 6. OSA- needs CPAP- has his machine from home 7. DM- reasonable on current regimen 8. Presumed peritonitis with elevated WBC in PD fluid- gram stain negative- culture no growth for 3 days- covering with vanc and fortaz- now will be dosed IV per pharmacy- will give one more dose tomorrow but hopefully can stop  9. Urinary retention- maybe due to illness/pain meds- pt now with foley catheter- On  flomax-- remove foley and do voiding trial today   Robert [name]    Labs: Basic Metabolic Panel:  Recent Labs Lab 06/17/14 2004 06/18/14 0748 06/19/14 1358  NA 141 139 140  K 4.7 4.6 4.1  CL 99 98 99  CO2 18* 21 25  GLUCOSE 115* 124* 175*  BUN 133* 106* 74*  CREATININE 18.25* 15.02* 11.12*  CALCIUM 8.0* 8.8 8.8  PHOS 9.8* 8.1* 6.1*   Liver Function Tests:  Recent Labs Lab 06/17/14 2004 06/18/14 0748 06/19/14 1358  ALBUMIN 3.0* 2.8* 2.9*   No results found for this basename: LIPASE, AMYLASE,  in the last 168 hours No results found for this basename: AMMONIA,  in the last 168 hours CBC:  Recent Labs Lab 06/14/14 0530 06/17/14 1242 06/17/14 2004 06/18/14 0748 06/19/14 1358  WBC 7.6 8.3 8.9 8.1 5.7  HGB 8.8* 7.4* 7.4* 7.0* 6.7*  HCT 26.6* 22.5* 23.0* 21.0* 20.7*  MCV 87.8 86.5 86.8 88.2 87.3  PLT 279 207 210 182 171   Cardiac Enzymes: No results found for this basename: CKTOTAL, CKMB, CKMBINDEX, TROPONINI,  in the last 168 hours CBG:  Recent Labs Lab 06/19/14 0804 06/19/14 1204 06/19/14 1843 06/19/14 2103 06/20/14 0742  GLUCAP 137* 151* 101* 178* 144*    Iron  Studies:  No results found for this basename: IRON, TIBC, TRANSFERRIN, FERRITIN,  in the last 72 hours Studies/Results: No results found. Medications: Infusions: . sodium chloride 10 mL/hr at 06/16/14 1147  . dialysis solution 2.5% low-MG    . heparin      Scheduled Medications: . sodium chloride   Intravenous Once  . aspirin EC  81 mg Oral Daily  . atorvastatin  40 mg Oral q1800  . calcium carbonate  800 mg of elemental calcium Oral TID WC  . cefTAZidime (FORTAZ)  IV  1 g Intravenous Q24H  . cholecalciferol  4,000 Units Oral Daily  . darbepoetin (ARANESP) injection - DIALYSIS  40 mcg Intravenous Q Thu-HD  . docusate sodium  100 mg Oral QPM  . ezetimibe  10 mg Oral QPM  . feeding supplement (NEPRO CARB STEADY)  237 mL Oral BID BM  . hydrALAZINE  100 mg Oral BID  . insulin aspart  0-20 Units Subcutaneous TID WC  . insulin glargine  11 Units Subcutaneous Daily  . levothyroxine  100 mcg Oral QAC breakfast  . nebivolol  15 mg Oral q morning - 10a  . sodium chloride  3 mL Intravenous Q12H  . tamsulosin  0.4 mg Oral Daily  . vitamin E  400 Units Oral q morning - 10a    have reviewed scheduled and prn medications.  Physical Exam: General: looks much better Heart: RRR Lungs: mostly clear Abdomen: obese- distended Extremities: edema Dialysis Access: PD catheter and new PC placed 9/10   06/20/2014,9:56 AM  LOS: 8 days

## 2014-06-21 LAB — RENAL FUNCTION PANEL
Albumin: 2.9 g/dL — ABNORMAL LOW (ref 3.5–5.2)
Anion gap: 15 (ref 5–15)
BUN: 55 mg/dL — AB (ref 6–23)
CALCIUM: 9.8 mg/dL (ref 8.4–10.5)
CO2: 27 mEq/L (ref 19–32)
Chloride: 97 mEq/L (ref 96–112)
Creatinine, Ser: 9.04 mg/dL — ABNORMAL HIGH (ref 0.50–1.35)
GFR calc Af Amer: 7 mL/min — ABNORMAL LOW (ref >90)
GFR calc non Af Amer: 6 mL/min — ABNORMAL LOW (ref >90)
Glucose, Bld: 138 mg/dL — ABNORMAL HIGH (ref 70–99)
Phosphorus: 5.5 mg/dL — ABNORMAL HIGH (ref 2.3–4.6)
Potassium: 3.6 mEq/L — ABNORMAL LOW (ref 3.7–5.3)
Sodium: 139 mEq/L (ref 137–147)

## 2014-06-21 LAB — CBC
HCT: 26.5 % — ABNORMAL LOW (ref 39.0–52.0)
HEMOGLOBIN: 8.8 g/dL — AB (ref 13.0–17.0)
MCH: 28.6 pg (ref 26.0–34.0)
MCHC: 33.2 g/dL (ref 30.0–36.0)
MCV: 86 fL (ref 78.0–100.0)
Platelets: 209 10*3/uL (ref 150–400)
RBC: 3.08 MIL/uL — ABNORMAL LOW (ref 4.22–5.81)
RDW: 13.9 % (ref 11.5–15.5)
WBC: 7.9 10*3/uL (ref 4.0–10.5)

## 2014-06-21 LAB — GLUCOSE, CAPILLARY: GLUCOSE-CAPILLARY: 178 mg/dL — AB (ref 70–99)

## 2014-06-21 MED ORDER — TAMSULOSIN HCL 0.4 MG PO CAPS: 0.4000 mg | ORAL_CAPSULE | Freq: Every day | ORAL | Status: DC

## 2014-06-21 MED ORDER — INSULIN ASPART 100 UNIT/ML ~~LOC~~ SOLN: 0.0000 [IU] | Freq: Three times a day (TID) | SUBCUTANEOUS | Status: DC

## 2014-06-21 MED ORDER — CALCIUM CARBONATE ANTACID 500 MG PO CHEW: 800.0000 mg | CHEWABLE_TABLET | Freq: Three times a day (TID) | ORAL | Status: DC

## 2014-06-21 MED ORDER — HEPARIN SODIUM (PORCINE) 1000 UNIT/ML DIALYSIS: 20.0000 [IU]/kg | INTRAMUSCULAR | Status: DC | PRN

## 2014-06-21 MED ORDER — VANCOMYCIN HCL IN DEXTROSE 750-5 MG/150ML-% IV SOLN
750.0000 mg | Freq: Once | INTRAVENOUS | Status: AC
  Administered 2014-06-21: 750 mg via INTRAVENOUS
  Filled 2014-06-21: qty 150

## 2014-06-21 MED ORDER — INSULIN GLARGINE 100 UNIT/ML SOLOSTAR PEN: 11.0000 [IU] | PEN_INJECTOR | Freq: Every day | SUBCUTANEOUS | Status: DC

## 2014-06-21 NOTE — Progress Notes (Signed)
06/21/2014 11:48 AM Hemodialysis Outpatient Note; this patient has been accepted at the [facility]. His schedule for this week only will be Wednesday at 12:00 PM and Thursday at 12:30 PM. The center will inform patient of his schedule thereafter. Thank you. Gordy Savers

## 2014-06-21 NOTE — Discharge Summary (Signed)
Physician Discharge Summary  Patient ID: Robert [name] MRN: [id number] DOB/AGE: 1961-04-11 53 y.o.  Admit date: 06/12/2014 Discharge date: 06/21/2014  Discharge Diagnoses:  Active Problems:   H/O unilateral nephrectomy   Type II or unspecified type diabetes mellitus with renal manifestations, uncontrolled   SVT (supraventricular tachycardia)   Severe obstructive sleep apnea   Hypothyroidism   Hyperlipidemia with target LDL less than 70   Chronic kidney disease, stage IV (severe)   Uremia of renal origin   Protein-calorie malnutrition, severe   Discharged Condition:  Stable   Hospital Course: 53 yo man followed by Dr. Moshe Cipro with ckd secondary to DM in setting of single kidney, who initially presented with uremic type symptoms, with BUN 146, Cr 18.85. Co2 18 , K 4.7, HGB 8.5. He has a PD cath in place (which had been flushed 3 times as op without difficulty) placed 05/18/14. He had plans to start op pd at [facility] on Wed 05/18/14. At the time of admission he had progressive nausea ( no food for at least 3 days), bad taste in mouth, vomiting,no constipation or diarrhea. No fevers, chills, Sob or chest pain. He reports falling on right shoulder at home felt dizzy from weakness, chest  Xray in ER shows no fracture or dislocation some DJD changes, was seen by ortho. His PD catheter has been nonfunctional- has failed aggressive bowel regimen and TPA as well as OR intervention. He has completed course of vanc and fortaz for peritonitis. He is seeking a second opinion regarding PD cath at [facility] with Dr. Vicente Masson. He had some urinary retention, foley catheter was removed and pt was able to void without difficulty with flomax. HD was initiated on 9/10 via perm cath, he underwent 4 treatments while in the hospital, which he tolerated well.  Outpt HD has been arranged at the [facility] center to begin 9/16     Treatments:  Blood pressure 132/78, pulse 78, temperature 98.5 F (36.9 C),  temperature source Oral, resp. rate 16, height 6' (1.829 m), weight 130.4 kg (287 lb 7.7 oz), SpO2 97.00%.  Disposition: 01-Home or Self Care     Medication List    ASK your doctor about these medications       aspirin 81 MG tablet  Take 81 mg by mouth every morning.     BYSTOLIC 10 MG tablet  Generic drug:  nebivolol  Take 15 mg by mouth every morning.     cetirizine 10 MG tablet  Commonly known as:  ZYRTEC  Take 10 mg by mouth every morning.     Coenzyme Q-10 100 MG capsule  Take 100 mg by mouth every morning.     docusate sodium 100 MG capsule  Commonly known as:  COLACE  Take 100 mg by mouth every evening.     ezetimibe 10 MG tablet  Commonly known as:  ZETIA  Take 10 mg by mouth every evening.     fluocinonide cream 0.05 %  Commonly known as:  LIDEX  Apply 1 application topically daily as needed (Skin inflammation (hand, thigh, back)).     furosemide 80 MG tablet  Commonly known as:  LASIX  Take 160 mg by mouth 2 (two) times daily.     gentamicin cream 0.1 %  Commonly known as:  GARAMYCIN  Apply 1 application topically once a week. Apply weekly to peritoneal catheter     HYALURONIC ACID PO  Take 20 mg by mouth every morning.     hydrALAZINE  50 MG tablet  Commonly known as:  APRESOLINE  Take 100 mg by mouth 2 (two) times daily.     Insulin Pen Needle 32G X 4 MM Misc  Use 4 pen needles per day to inject insulin     insulin regular human CONCENTRATED 500 UNIT/ML Soln injection  Commonly known as:  HUMULIN R  Inject 5 Units into the skin 3 (three) times daily with meals.     lactulose (encephalopathy) 10 GM/15ML Soln  Commonly known as:  CHRONULAC  Take 20-40 g by mouth daily as needed (Laxative).     LANTUS SOLOSTAR 100 UNIT/ML Solostar Pen  Generic drug:  Insulin Glargine  Inject 22 Units into the skin daily.     levothyroxine 100 MCG tablet  Commonly known as:  SYNTHROID, LEVOTHROID  Take 1 tablet (100 mcg total) by mouth daily.      Liraglutide 18 MG/3ML Sopn  Commonly known as:  VICTOZA  Inject 1.2 mg into the skin daily.     NON FORMULARY  every evening. CPAP therapy.     rosuvastatin 20 MG tablet  Commonly known as:  CRESTOR  Take 1 tablet (20 mg total) by mouth daily.     senna 8.6 MG tablet  Commonly known as:  SENOKOT  Take 1 tablet by mouth every evening.     Vitamin D 2000 UNITS tablet  Take 4,000 Units by mouth every morning.     vitamin E 400 UNIT capsule  Take 400 Units by mouth every morning.         Signed: Shelle Iron [facility]  06/21/2014, 3:25 PM  Jamal Maes, MD [facility] [phone] Pager 06/21/2014, 4:55 PM

## 2014-06-21 NOTE — Procedures (Signed)
I have personally attended this patient's dialysis session.   HD #4 via right TDC BFR 400 goal 2-3 liters 4K bath with K of 3.6  Chaney Ingram B

## 2014-06-21 NOTE — Progress Notes (Signed)
Agree with d/c from acute PT, patient demonstrates safe mobility and good awareness of modest deficits.  Alben Deeds, [place] DPT  [phone]

## 2014-06-21 NOTE — Progress Notes (Signed)
Assessment/ Plan: Pt is a 53 y.o. yo male who was admitted on 06/12/2014 with uremia needing initiation of dialysis as well as trauma to right shoulder   1. New ESRD- PD catheter not functional now for several days- has failed aggressive bowel regimen and TPA as well as OR intervention. Patient was uremic and I felt the best course of actionwas to place a PC and get his uremia better. He is s/p HD 9/10, 9/11, third 9/12 and 4th today is underway.  He will hopefully be discharged today and Dr. Moshe Cipro is working out the logistics of a "custom" dialysis schedule for this week (Wed and Thurs) as pt's daughter is getting married this weekend in [place].  [facility] is being notified of need for placement at [place] unit, then work on more permanent HD schedule for the next week with plans of sending patient to [facility] Dr. Vicente Masson to see if can get PD catheter to work so hopefully HD would only be 3 weeks or so but do not know at this time.  Pt concerned about keeping his job and cannot take short term disability 2. Shoulder pain- x ray did not show any bony abnormality- sprain of AC joint- to get injection - stay without sling- appreciate ortho assist- is much better now-  3. Anemia- significant and predated admission- on aranesp- iron stores OK- transfused 9/12. Continue 40 Qweek as outpt - give on Wednesday 4. Secondary hyperparathyroidism- high phos, low calc- have started tums- will fine tune when not so constipated- hemo will help- PTH 186- no vit D 5. HTN/volume- overloaded- improved when we are able to do dialysis- also on hydralazine and bystolic 6. OSA- needs CPAP- has his machine from home 7. DM- reasonable on current regimen 8. Presumed peritonitis with elevated WBC in PD fluid- gram stain negative- culture no growth for 3 days- covering with vanc and fortaz- now will be dosed IV per pharmacy- will give one more dose of vanco and fortaz with HD today) 9. Urinary retention- foley out -  voiding   Subjective:   Getting 4th HD treatment today - on dialysis now [facility] working fine Has emptied bladder 3 times since foley was removed No discomfort other than post surgical from PD cath removal  Objective Vital signs in last 24 hours: Filed Vitals:   06/21/14 0515 06/21/14 0720 06/21/14 0729 06/21/14 0744  BP: 168/79 161/92 167/72 155/88  Pulse: 73 68 68 63  Temp: 98.9 F (37.2 C) 98.6 F (37 C)    TempSrc:  Oral    Resp: 18 18    Height:      Weight:  133.2 kg (293 lb 10.4 oz)    SpO2: 97% 97%     Weight change: -2.942 kg (-6 lb 7.8 oz)  Intake/Output Summary (Last 24 hours) at 06/21/14 0754 Last data filed at 06/21/14 0516  Gross per 24 hour  Intake    360 ml  Output    550 ml  Net   -190 ml    Physical Exam: General: Awake and alert. Very nic soft spoken gentleman Heart: Regular rhythm S1S2 no S3 Lungs: clear anteriorly Abdomen: obese- distended Extremities: edema bilat LE's Dialysis Access: PD catheter TDC (placed 9/10)  Labs: Basic Metabolic Panel:  Recent Labs Lab 06/18/14 0748 06/19/14 1358 06/20/14 1040  NA 139 140 139  K 4.6 4.1 3.8  CL 98 99 96  CO2 21 25 28   GLUCOSE 124* 175* 202*  BUN 106* 74* 42*  CREATININE 15.02* 11.12* 7.61*  CALCIUM 8.8 8.8 9.5  PHOS 8.1* 6.1* 4.3   Liver Function Tests:  Recent Labs Lab 06/18/14 0748 06/19/14 1358 06/20/14 1040  ALBUMIN 2.8* 2.9* 3.1*   No results found for this basename: LIPASE, AMYLASE,  in the last 168 hours No results found for this basename: AMMONIA,  in the last 168 hours CBC:  Recent Labs Lab 06/17/14 1242 06/17/14 2004 06/18/14 0748 06/19/14 1358 06/20/14 1040  WBC 8.3 8.9 8.1 5.7 7.1  HGB 7.4* 7.4* 7.0* 6.7* 9.1*  HCT 22.5* 23.0* 21.0* 20.7* 27.2*  MCV 86.5 86.8 88.2 87.3 85.5  PLT 207 210 182 171 201   Cardiac Enzymes: No results found for this basename: CKTOTAL, CKMB, CKMBINDEX, TROPONINI,  in the last 168 hours CBG:  Recent Labs Lab 06/19/14 2103  06/20/14 0742 06/20/14 1215 06/20/14 1649 06/20/14 2133  GLUCAP 178* 144* 208* 101* 204*   Scheduled Medications: . sodium chloride   Intravenous Once  . aspirin EC  81 mg Oral Daily  . atorvastatin  40 mg Oral q1800  . calcium carbonate  800 mg of elemental calcium Oral TID WC  . cefTAZidime (FORTAZ)  IV  1 g Intravenous Q24H  . cholecalciferol  4,000 Units Oral Daily  . darbepoetin (ARANESP) injection - DIALYSIS  40 mcg Intravenous Q Thu-HD  . docusate sodium  100 mg Oral QPM  . ezetimibe  10 mg Oral QPM  . feeding supplement (NEPRO CARB STEADY)  237 mL Oral BID BM  . hydrALAZINE  100 mg Oral BID  . insulin aspart  0-20 Units Subcutaneous TID WC  . insulin glargine  11 Units Subcutaneous Daily  . levothyroxine  100 mcg Oral QAC breakfast  . nebivolol  15 mg Oral q morning - 10a  . sodium chloride  3 mL Intravenous Q12H  . tamsulosin  0.4 mg Oral Daily  . vitamin E  400 Units Oral q morning - 10a    have reviewed scheduled and prn medications.  Jamal Maes, MD [facility] [phone] Pager 06/21/2014, 8:07 AM

## 2014-06-21 NOTE — Progress Notes (Signed)
Pt left floor via wheelchair accompanied by staff. 

## 2014-06-21 NOTE — Progress Notes (Signed)
Discharge instructions given, pt verbalized understanding.  VSS.  Denies pain. Pt waiting on transportation home. Prescription given.

## 2014-06-21 NOTE — Progress Notes (Signed)
Note/chart reviewed. Agree with intervention as pt has lost an additional 19 lbs since admission.   Pryor Ochoa RD, LDN Inpatient Clinical Dietitian Pager: [phone] After Hours Pager: [phone]

## 2014-06-21 NOTE — Progress Notes (Signed)
NUTRITION FOLLOW-UP  Pt meets criteria for SEVERE MALNUTRITION in the context of acute illness/injury as evidenced by energy intake of </= 50% for >/= 5 days and a 7% weight loss in one month and from usual body weight.  DOCUMENTATION CODES Per approved criteria  -Severe malnutrition in the context of acute illness or injury -Obesity Unspecified   INTERVENTION: -Continue Nepro Shake po BID, each supplement provides 425 kcal and 19 grams protein.  -Encourage PO intake.  NUTRITION DIAGNOSIS: Inadequate oral intake related to nausea as evidenced by pt report and varied meal completion of 0-100%; improved  Goal: Pt to meet >/= 90% of their estimated nutrition needs; met  Monitor:  PO intake, weight trends, labs, I/O's  53 y.o. male  Admitting Dx: Uremia of renal origin  ASSESSMENT: Pt with ckd secondary to DM in setting of single kidney,now presents with uremic type symptoms of nausea, vomiting, and weakness. He has had progressive nausea ( no food for at least 3 days), bad taste in mouth, vomiting.  9/7-Pt on PD. Pt reports his appetite currently is just "ok". Meal completion has varied from 0-100% depending on his state of nausea, however most meals have been 25%. Pt reports over the last week he has been having nausea/vomiting and reports the 5 days prior to admission, he barely ate anything, only a couple of bites of food here and there. Pt reports he has lost weight within the last week due to his very poor po intake. Pt reports his usual body weight of 330 lbs (+/= 6 lbs of fluid weight) where he reported last weighing a little over a week ago. Noted pt with a 7% weight loss within one month and from usual body weight. Pt is willing to try Nepro to help with calorie and protein needs. Will order. Pt with no sign of fat or muscle mass loss.  9/14- Pt on HD now. PD catheter removed 9/9. Pt reports his appetite has improved. Meal completion is 50-100%. Pt reports he has been drinking  his Nepro shakes. Pt questioned about which food are high in potassium and what are his protein needs while on HD. Pt was educated and questions were appropriately answered.  Labs: Low potassium and GFR. High BUN, creatinine, phosphorous (5.5).  Height: Ht Readings from Last 1 Encounters:  06/12/14 6' (1.829 m)    Weight: Wt Readings from Last 1 Encounters:  06/21/14 293 lb 10.4 oz (133.2 kg)   BMI:  Body mass index is 39.82 kg/(m^2). Class II obesity  Re-Estimated Nutritional Needs: Kcal: 2200-2400 Protein: 110-130 grams  Fluid: 1.2 L/day   Skin: +2 generalized edema  Diet Order: Diabetic/renal with 1200 ml fluids   Intake/Output Summary (Last 24 hours) at 06/21/14 0931 Last data filed at 06/21/14 0516  Gross per 24 hour  Intake    240 ml  Output    550 ml  Net   -310 ml    Last BM: 9/12  Labs:   Recent Labs Lab 06/19/14 1358 06/20/14 1040 06/21/14 0728  NA 140 139 139  K 4.1 3.8 3.6*  CL 99 96 97  CO2 _0 BUN 74* 42* 55*  CREATININE 11.12* 7.61* 9.04*  CALCIUM 8.8 9.5 9.8  PHOS 6.1* 4.3 5.5*  GLUCOSE 175* 202* 138*    CBG (last 3)   Recent Labs  06/20/14 1215 06/20/14 1649 06/20/14 2133  GLUCAP 208* 101* 204*    Scheduled Meds: . sodium chloride   Intravenous Once  .  aspirin EC  81 mg Oral Daily  . atorvastatin  40 mg Oral q1800  . calcium carbonate  800 mg of elemental calcium Oral TID WC  . cefTAZidime (FORTAZ)  IV  1 g Intravenous Q24H  . cholecalciferol  4,000 Units Oral Daily  . darbepoetin (ARANESP) injection - DIALYSIS  40 mcg Intravenous Q Thu-HD  . docusate sodium  100 mg Oral QPM  . ezetimibe  10 mg Oral QPM  . feeding supplement (NEPRO CARB STEADY)  237 mL Oral BID BM  . hydrALAZINE  100 mg Oral BID  . insulin aspart  0-20 Units Subcutaneous TID WC  . insulin glargine  11 Units Subcutaneous Daily  . levothyroxine  100 mcg Oral QAC breakfast  . nebivolol  15 mg Oral q morning - 10a  . sodium chloride  3 mL Intravenous  Q12H  . tamsulosin  0.4 mg Oral Daily  . vancomycin  750 mg Intravenous Once  . vitamin E  400 Units Oral q morning - 10a    Continuous Infusions: . sodium chloride 10 mL/hr at 06/16/14 1147  . dialysis solution 2.5% low-MG    . heparin      Past Medical History  Diagnosis Date  . Diabetes mellitus without complication   . Hypertension   . Thyroid disease   . Sleep apnea 06/18/11    split-night sleep study- [facility].  . Hyperlipidemia   . PAT (paroxysmal atrial tachycardia)   . PSVT (paroxysmal supraventricular tachycardia)   . Blood transfusion without reported diagnosis   . Chronic kidney disease     Stage IV  . CPAP (continuous positive airway pressure) dependence   . Dysrhythmia   . Shortness of breath   . Hypothyroidism   . GERD (gastroesophageal reflux disease)     n/v a lot  . Headache(784.0)     slight dull h/a due kidney failure  . Cancer     skin ca right shoulder  . Anemia     low hgb at present  . Umbilical hernia     will repair with thi surgery    Past Surgical History  Procedure Laterality Date  . Nephrectomy Left 1974  . Renal biopsy Right 2012  . Skin cancer excision    . Appendectomy  1974  . Capd insertion N/A 05/18/2014    Procedure: LAPAROSCOPIC INSERTION CONTINUOUS AMBULATORY PERITONEAL DIALYSIS  (CAPD) CATHETER;  Surgeon: Ralene Ok, MD;  Location: [place];  Service: General;  Laterality: N/A;  . Umbilical hernia repair N/A 05/18/2014    Procedure: HERNIA REPAIR UMBILICAL ADULT;  Surgeon: Ralene Ok, MD;  Location: [place];  Service: General;  Laterality: N/A;  . Laparoscopic repositioning capd catheter N/A 06/16/2014    Procedure: LAPAROSCOPIC REPOSITIONING CAPD CATHETER;  Surgeon: Ralene Ok, MD;  Location: [place];  Service: General;  Laterality: N/A;    Kallie Locks, MS, Provisional LDN Pager # [phone] After hours/ weekend pager # [phone]

## 2014-06-21 NOTE — Progress Notes (Signed)
Vikrant Pryce, PT DPT  [phone]  

## 2014-06-21 NOTE — Progress Notes (Signed)
Physical Therapy Discharge Patient Details Name: Robert [name] MRN: [id number] DOB: [birth year] Today's Date: 06/21/2014 Time: 8208-1388 PT Time Calculation (min): 10 min  Patient discharged from PT services secondary to goals met and no further PT needs identified.  Please see latest therapy progress note for current level of functioning and progress toward goals.    Progress and discharge plan discussed with patient and/or caregiver: Patient/Caregiver agrees with plan  GP     Clemencia Course, SPT 06/21/2014, 3:22 PM

## 2014-06-21 NOTE — Progress Notes (Signed)
PT Cancellation Note  Patient Details Name: Robert [name] MRN: [id number] DOB: 1961-02-03   Cancelled Treatment:    Reason Eval/Treat Not Completed: Patient at procedure or test/unavailable (at HD will try back later as time permits)   Duncan Dull 06/21/2014, 7:38 AM  Alben Deeds, PT DPT  [phone]

## 2014-06-21 NOTE — Progress Notes (Signed)
Physical Therapy Treatment Patient Details Name: Robert [name] MRN: [id number] DOB: [birth year] Today's Date: 06/21/2014    History of Present Illness Pt is a 53 y.o. male with CKD 2/2 DM initially presenting to ED on 06/12/14 with c/o nausea/vomiting and weakness. Pt had PD catheter placed 05/18/14 and planned to start op pd at Home training. Pt with sprain of R AC joint and hx of ESRD, HTN, DM, PSVT, PAT, and fall 2/2 dizziness. Pt now on HD.    PT Comments    Pt is mod I for mobility requiring extra time. Pt has met 3/4 PT goals and does not require any further PT services. Supervision was provided for stair navigation, but based on performance, pt is safe to ascend/descend stairs mod I.   Follow Up Recommendations  No PT follow up     Equipment Recommendations  None recommended by PT    Recommendations for Other Services       Precautions / Restrictions Precautions Precautions: Fall Restrictions Weight Bearing Restrictions: No    Mobility  Bed Mobility               General bed mobility comments: Pt sitting EOB upon arrival  Transfers Overall transfer level: Modified independent Equipment used: None Transfers: Sit to/from Stand Sit to Stand: Modified independent (Device/Increase time)            Ambulation/Gait Ambulation/Gait assistance: Modified independent (Device/Increase time) Ambulation Distance (Feet): 200 Feet (2x) Assistive device: None Gait Pattern/deviations: Step-through pattern;Decreased stride length Gait velocity: decreased Gait velocity interpretation: Below normal speed for age/gender General Gait Details: Pt demonstrates rhythmically slow gait pattern with reduced arm swing and limited trunk movement. Pt instructed to notify therapist if he required a rest break, but was not necessary. Pt able to amb entire 200' without use of rails or rest break. Pt able to amb and talk without becoming noticably fatigued or short of breath.      Stairs Stairs: Yes Stairs assistance: Supervision Stair Management: One rail Right;Alternating pattern (Rail on R with ascend) Number of Stairs: 10 General stair comments: Pt able to safely navigate stairs without assistance or unsteadiness. Pt reports use of railing for "guidance" more than support. Supervision was provided since stairs had not yet been assessed since admission. Based on pt performance, pt is safe to perform steps at home mod I with use of rails and extra time.   Wheelchair Mobility    Modified Rankin (Stroke Patients Only)       Balance Overall balance assessment: Independent   Sitting balance-Leahy Scale: Good       Standing balance-Leahy Scale: Good                      Cognition Arousal/Alertness: Awake/alert Behavior During Therapy: WFL for tasks assessed/performed Overall Cognitive Status: Within Functional Limits for tasks assessed                      Exercises      General Comments General comments (skin integrity, edema, etc.): Pt educated on how to amb through crowds to protect R UE.       Pertinent Vitals/Pain      Home Living                      Prior Function            PT Goals (current goals can now be found in the care plan section)  Acute Rehab PT Goals Patient Stated Goal: to go home PT Goal Formulation: With patient Time For Goal Achievement: 07/02/14 Potential to Achieve Goals: Good Progress towards PT goals: Goals met/education completed, patient discharged from PT    Frequency       PT Plan      Co-evaluation             End of Session Equipment Utilized During Treatment: Gait belt Activity Tolerance: Patient tolerated treatment well Patient left: in bed;with call bell/phone within reach     Time: 4128-7867 PT Time Calculation (min): 10 min  Charges:                       G Codes:      Jasmeen Fritsch 06/21/2014, 3:19 PM Levonne Hubert, SPT

## 2014-06-28 ENCOUNTER — Other Ambulatory Visit: Payer: Self-pay | Admitting: Endocrinology

## 2014-07-06 ENCOUNTER — Telehealth: Payer: Self-pay | Admitting: Endocrinology

## 2014-07-06 NOTE — Telephone Encounter (Signed)
Patient would like to talk to you concerning his blood work.

## 2014-07-08 ENCOUNTER — Encounter: Payer: Self-pay | Admitting: Endocrinology

## 2014-07-08 ENCOUNTER — Ambulatory Visit (INDEPENDENT_AMBULATORY_CARE_PROVIDER_SITE_OTHER): Payer: BC Managed Care – PPO | Admitting: Endocrinology

## 2014-07-08 VITALS — BP 134/86 | HR 76 | Temp 98.3°F | Resp 16 | Ht 72.0 in | Wt 287.6 lb

## 2014-07-08 DIAGNOSIS — E785 Hyperlipidemia, unspecified: Secondary | ICD-10-CM

## 2014-07-08 DIAGNOSIS — E039 Hypothyroidism, unspecified: Secondary | ICD-10-CM

## 2014-07-08 DIAGNOSIS — E1165 Type 2 diabetes mellitus with hyperglycemia: Secondary | ICD-10-CM

## 2014-07-08 DIAGNOSIS — E291 Testicular hypofunction: Secondary | ICD-10-CM

## 2014-07-08 DIAGNOSIS — IMO0002 Reserved for concepts with insufficient information to code with codable children: Secondary | ICD-10-CM

## 2014-07-08 NOTE — Progress Notes (Signed)
Patient ID: Robert [name], male   DOB: [birth year], 53 y.o.   MRN: [id number]   Reason for Appointment: Type II Diabetes follow-up   History of Present Illness   Diagnosis date:  2012  Previous history: His blood sugar has been difficult to control since onset. Initially was taking oral hypoglycemic drugs like glyburide but could not take metformin because of renal dysfunction. Since Victoza did not help his control he was started on Lantus and then U 500 insulin also His A1c has previously ranged from 7.8-9.8, the lowest level in 03/2013  Recent history: He has been on a regimen of basal insulin once a day in the morning and U-500 insulin before meals He was on Victoza but because of his markedly decreased appetite and weight loss this was stopped in 9/15 Although his appetite is not quite normal it is somewhat better; glucose is not appearing higher with stopping Victoza He is still not on peritoneal dialysis as yet and has started hemodialysis His blood sugars are looking fairly good but he is checking readings mostly in the mornings and mid day His suppertime dose was reduced because of tendency to low normal fasting readings Fasting blood sugars may be slightly higher Has only rare hypoglycemia, once after lunch probably from decreased intake Checking blood sugar somewhat sporadically He is concerned about cost of his insulin regimen     Side effects from medications: None Insulin regimen: LANTUS 22 units In a.m. now. U-500 insulin: 5-5-5. 30 min ac;        Proper timing of medications in relation to meals: Yes.          Monitors blood glucose: Once a day or less.    Glucometer: One Touch.          Blood Glucose readings from meter download:    PREMEAL Breakfast Lunch Dinner  12-1 AM  Overall  Glucose range:  100-152   132-162   65, 129   96, 161     Mean/median:      129          Meals: 3 meals per day. Dinner 7 p,m. Currently on the low phosphorus diet and has fewer  carbohydrates        Physical activity: exercise: none           Weight control:  Wt Readings from Last 3 Encounters:  07/08/14 287 lb 9.6 oz (130.455 kg)  06/21/14 287 lb 7.7 oz (130.4 kg)  06/21/14 287 lb 7.7 oz (130.4 kg)          Complications:   nephropathy, neuropathy   Diabetes labs:  Lab Results  Component Value Date   HGBA1C 7.0* 03/31/2014   HGBA1C 9.4* 01/04/2014   Lab Results  Component Value Date   MICROALBUR 224.5 Repeated and verified X2.* 01/04/2014   LDLCALC 20 03/31/2014   CREATININE 9.04* 06/21/2014       Medication List       This list is accurate as of: 07/08/14  4:59 PM.  Always use your most recent med list.               aspirin 81 MG tablet  Take 81 mg by mouth every morning.     BYSTOLIC 10 MG tablet  Generic drug:  nebivolol  Take 15 mg by mouth every morning.     calcium carbonate 500 MG chewable tablet  Commonly known as:  TUMS - dosed in mg elemental calcium  Chew 4 tablets (  800 mg of elemental calcium total) by mouth 3 (three) times daily with meals.     cetirizine 10 MG tablet  Commonly known as:  ZYRTEC  Take 10 mg by mouth every morning.     Coenzyme Q-10 100 MG capsule  Take 100 mg by mouth every morning.     CRESTOR 20 MG tablet  Generic drug:  rosuvastatin  TAKE 1 TABLET (20 MG TOTAL) BY MOUTH DAILY.     docusate sodium 100 MG capsule  Commonly known as:  COLACE  Take 100 mg by mouth every evening.     ezetimibe 10 MG tablet  Commonly known as:  ZETIA  Take 10 mg by mouth every evening.     fluocinonide cream 0.05 %  Commonly known as:  LIDEX  Apply 1 application topically daily as needed (Skin inflammation (hand, thigh, back)).     gentamicin cream 0.1 %  Commonly known as:  GARAMYCIN  Apply 1 application topically once a week. Apply weekly to peritoneal catheter     hydrALAZINE 50 MG tablet  Commonly known as:  APRESOLINE  Take 100 mg by mouth 2 (two) times daily.     Insulin Glargine 100 UNIT/ML Solostar  Pen  Commonly known as:  LANTUS  Inject 22 Units into the skin daily.     Insulin Pen Needle 32G X 4 MM Misc  Use 4 pen needles per day to inject insulin     insulin regular human CONCENTRATED 500 UNIT/ML Soln injection  Commonly known as:  HUMULIN R  Inject 5 Units into the skin 3 (three) times daily with meals.     levothyroxine 100 MCG tablet  Commonly known as:  SYNTHROID, LEVOTHROID  Take 1 tablet (100 mcg total) by mouth daily.     NON FORMULARY  every evening. CPAP therapy.     OVER THE COUNTER MEDICATION  Renovite     polyethylene glycol packet  Commonly known as:  MIRALAX / GLYCOLAX  Take 17 g by mouth daily.     senna 8.6 MG tablet  Commonly known as:  SENOKOT  Take 1 tablet by mouth every evening.     sildenafil 100 MG tablet  Commonly known as:  VIAGRA  Take 100 mg by mouth daily as needed for erectile dysfunction.     tamsulosin 0.4 MG Caps capsule  Commonly known as:  FLOMAX  Take 1 capsule (0.4 mg total) by mouth daily.     Vitamin D 2000 UNITS tablet  Take 4,000 Units by mouth every morning.        Allergies: No Known Allergies  Past Medical History  Diagnosis Date  . Diabetes mellitus without complication   . Hypertension   . Thyroid disease   . Sleep apnea 06/18/11    split-night sleep study- [facility].  . Hyperlipidemia   . PAT (paroxysmal atrial tachycardia)   . PSVT (paroxysmal supraventricular tachycardia)   . Blood transfusion without reported diagnosis   . Chronic kidney disease     Stage IV  . CPAP (continuous positive airway pressure) dependence   . Dysrhythmia   . Shortness of breath   . Hypothyroidism   . GERD (gastroesophageal reflux disease)     n/v a lot  . Headache(784.0)     slight dull h/a due kidney failure  . Cancer     skin ca right shoulder  . Anemia     low hgb at present  . Umbilical hernia  will repair with thi surgery    Past Surgical History  Procedure Laterality Date  .  Nephrectomy Left 1974  . Renal biopsy Right 2012  . Skin cancer excision    . Appendectomy  1974  . Capd insertion N/A 05/18/2014    Procedure: LAPAROSCOPIC INSERTION CONTINUOUS AMBULATORY PERITONEAL DIALYSIS  (CAPD) CATHETER;  Surgeon: Ralene Ok, MD;  Location: [place];  Service: General;  Laterality: N/A;  . Umbilical hernia repair N/A 05/18/2014    Procedure: HERNIA REPAIR UMBILICAL ADULT;  Surgeon: Ralene Ok, MD;  Location: [place];  Service: General;  Laterality: N/A;  . Laparoscopic repositioning capd catheter N/A 06/16/2014    Procedure: LAPAROSCOPIC REPOSITIONING CAPD CATHETER;  Surgeon: Ralene Ok, MD;  Location: [place];  Service: General;  Laterality: N/A;    Family History  Problem Relation Age of Onset  . Colon polyps Father   . Colon cancer Neg Hx     Social History:  reports that he quit smoking about 7 months ago. His smoking use included Pipe. He has never used smokeless tobacco. He reports that he does not drink alcohol or use illicit drugs.  Review of Systems:  Hypertension:  BP at home 130/80-90 in the mornings, later in the day 150/100-110. His blood pressure has been managed by his cardiologist mostly  Lipids: He has been unable to get consistent control. Was switched from Lipitor to Crestor because of relatively high LDL levels. Also subsequently Zetia was added to get LDL to target.    Lab Results  Component Value Date   CHOL 97 03/31/2014   CHOL 174 01/04/2014   Lab Results  Component Value Date   HDL 32.60* 03/31/2014   HDL 38.80* 01/04/2014   Lab Results  Component Value Date   LDLCALC 20 03/31/2014   LDLCALC 119* 01/04/2014   Lab Results  Component Value Date   TRIG 221.0* 03/31/2014   TRIG 83.0 01/04/2014   Lab Results  Component Value Date   CHOLHDL 3 03/31/2014   CHOLHDL 4 01/04/2014   No results found for this basename: LDLDIRECT    He tends to get short of breath on exertion.  Thyroid: He has had long-standing hypothyroidism   TSH  had been previously consistent. Does complain of feeling tired  but has multiple other problems including severe anemia  Lab Results  Component Value Date   TSH 2.75 01/04/2014   He is getting Epogen with dialysis now     HYPOGONADISM: 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 metabolic syndrome and had subjectively improved with supplementation using Fortesta He is off testosterone and free testosterone was still normal in 3/15   Diabetic neuropathy: Has mild sensory loss   Examination:   BP 134/86  Pulse 76  Temp(Src) 98.3 F (36.8 C)  Resp 16  Ht 6' (1.829 m)  Wt 287 lb 9.6 oz (130.455 kg)  BMI 39.00 kg/m2  SpO2 98%  Body mass index is 39 kg/(m^2).    ASSESSMENT/ PLAN:    Diabetes type 2:   His blood sugars appear reasonably good at home as discussed in history of present illness However he has check blood sugars mostly in the morning and midday  No recent A1c available  Although he is on hemodialysis his insulin requirement is not any different However has not seen much higher readings with stopping Victoza  Currently fasting blood sugars are relatively higher and have recommended adjusting the Lantus based on fasting blood sugars  Also need to  adjust supper time dose based on readings after supper which he has not done  Previously has had difficulty controlling his glucose adequately even with multiple insulin doses adding up to over 100 units a day  Encouraged him to check his sugar much more often especially when starting  peritoneal dialysis  Also needs to start exercise when he is able to He is concerned about the cost of medications and  given co-pay card for Lantus and Humulin R Currently Toujeo is not referred on his formulary No need to restart his Victoza at this time  He has a lot of questions about his management when he goes on peritoneal dialysis and this was discussed   HYPERLIPIDEMIA: His last LDL was only 20 and most likely  this was related to his sharply decreased intake. He will leave off his Zetia for now  Recommendations made today are outlined in his instructions  Patient Instructions  Please check blood sugars at least half the time about 2 hours after any meal including after supper and times per week on waking up.  Please bring blood sugar monitor to each visit  Lantus 24 units and may go up  keep am sugars 90-120   Adjust R and Lantus if both bedtime and am sugars go up with P.D.       Hypothyroidism: Need followup TSH on the next visit  Counseling time over 50% of today's 25 minute visit  Kallum Jorgensen 07/08/2014, 4:59 PM

## 2014-07-08 NOTE — Patient Instructions (Addendum)
Please check blood sugars at least half the time about 2 hours after any meal including after supper and times per week on waking up.  Please bring blood sugar monitor to each visit  Lantus 24 units and may go up  keep am sugars 90-120   Adjust R and Lantus if both bedtime and am sugars go up with P.D.   Use 1/2 doses insulin for colonoscopy

## 2014-07-09 ENCOUNTER — Encounter: Payer: Self-pay | Admitting: Gastroenterology

## 2014-07-09 ENCOUNTER — Telehealth: Payer: Self-pay | Admitting: Endocrinology

## 2014-07-09 ENCOUNTER — Ambulatory Visit (INDEPENDENT_AMBULATORY_CARE_PROVIDER_SITE_OTHER): Payer: BC Managed Care – PPO | Admitting: Gastroenterology

## 2014-07-09 VITALS — BP 166/90 | HR 80 | Ht 72.0 in | Wt 286.6 lb

## 2014-07-09 DIAGNOSIS — Z1211 Encounter for screening for malignant neoplasm of colon: Secondary | ICD-10-CM

## 2014-07-09 MED ORDER — PEG-KCL-NACL-NASULF-NA ASC-C 100 G PO SOLR: 1.0000 | Freq: Once | ORAL | Status: DC

## 2014-07-09 NOTE — Progress Notes (Signed)
HPI: This is a  very pleasant 53 year old man whom I am meeting for the first time today.  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  Current ongoing [facility] transplant.  His father has had colon polyp.   He has PD catheter in place, also right chest vascular access.   Still in HD.  Will be started PD next week.  Never had colonoscopy.   He would be pretty constipated without daily miralax.  Review of systems: Pertinent positive and negative review of systems were noted in the above HPI section. Complete review of systems was performed and was otherwise normal.    Past Medical History  Diagnosis Date  . Diabetes mellitus without complication   . Hypertension   . Thyroid disease   . Sleep apnea 06/18/11    split-night sleep study- [facility].  . Hyperlipidemia   . PAT (paroxysmal atrial tachycardia)   . PSVT (paroxysmal supraventricular tachycardia)   . Blood transfusion without reported diagnosis   . Chronic kidney disease     Stage IV  . CPAP (continuous positive airway pressure) dependence   . Dysrhythmia   . Shortness of breath   . Hypothyroidism   . GERD (gastroesophageal reflux disease)     n/v a lot  . Headache(784.0)     slight dull h/a due kidney failure  . Cancer     skin ca right shoulder  . Anemia     low hgb at present  . Umbilical hernia     will repair with thi surgery    Past Surgical History  Procedure Laterality Date  . Nephrectomy Left 1974  . Renal biopsy Right 2012  . Skin cancer excision    . Appendectomy  1974  . Capd insertion N/A 05/18/2014    Procedure: LAPAROSCOPIC INSERTION CONTINUOUS AMBULATORY PERITONEAL DIALYSIS  (CAPD) CATHETER;  Surgeon: Ralene Ok, MD;  Location: [place];  Service: General;  Laterality: N/A;  . Umbilical hernia repair N/A 05/18/2014    Procedure: HERNIA REPAIR UMBILICAL ADULT;  Surgeon: Ralene Ok, MD;  Location: [place];  Service: General;  Laterality: N/A;  . Laparoscopic  repositioning capd catheter N/A 06/16/2014    Procedure: LAPAROSCOPIC REPOSITIONING CAPD CATHETER;  Surgeon: Ralene Ok, MD;  Location: [place];  Service: General;  Laterality: N/A;    Current Outpatient Prescriptions  Medication Sig Dispense Refill  . aspirin 81 MG tablet Take 81 mg by mouth every morning.       Marland Kitchen BYSTOLIC 10 MG tablet Take 15 mg by mouth every morning.       . calcium carbonate (TUMS - DOSED IN MG ELEMENTAL CALCIUM) 500 MG chewable tablet Chew 4 tablets (800 mg of elemental calcium total) by mouth 3 (three) times daily with meals.  90 tablet  0  . cetirizine (ZYRTEC) 10 MG tablet Take 10 mg by mouth every morning.       Marland Kitchen CRESTOR 20 MG tablet TAKE 1 TABLET (20 MG TOTAL) BY MOUTH DAILY.  90 tablet  1  . docusate sodium (COLACE) 100 MG capsule Take 100 mg by mouth every evening.       . ezetimibe (ZETIA) 10 MG tablet Take 10 mg by mouth every evening.       . fluocinonide cream (LIDEX) [id number] % Apply 1 application topically daily as needed (Skin inflammation (hand, thigh, back)).       Marland Kitchen gentamicin cream (GARAMYCIN) 0.1 % Apply 1 application topically once a week. Apply weekly to peritoneal catheter      .  hydrALAZINE (APRESOLINE) 50 MG tablet Take 100 mg by mouth 2 (two) times daily.       . Insulin Pen Needle 32G X 4 MM MISC Use 4 pen needles per day to inject insulin  250 each  1  . insulin regular human CONCENTRATED (HUMULIN R) 500 UNIT/ML SOLN injection Inject 5 Units into the skin 3 (three) times daily with meals.      . lactulose, encephalopathy, (CHRONULAC) 10 GM/15ML SOLN       . LANTUS SOLOSTAR 100 UNIT/ML Solostar Pen       . levothyroxine (SYNTHROID, LEVOTHROID) 100 MCG tablet Take 1 tablet (100 mcg total) by mouth daily.  90 tablet  1  . NON FORMULARY every evening. CPAP therapy.      Marland Kitchen OVER THE COUNTER MEDICATION Renovite      . polyethylene glycol (MIRALAX / GLYCOLAX) packet Take 17 g by mouth daily.      Marland Kitchen senna (SENOKOT) 8.6 MG tablet Take 1 tablet by mouth  every evening.       . sildenafil (VIAGRA) 100 MG tablet Take 100 mg by mouth daily as needed for erectile dysfunction.      . tamsulosin (FLOMAX) 0.4 MG CAPS capsule Take 1 capsule (0.4 mg total) by mouth daily.  30 capsule  0   No current facility-administered medications for this visit.    Allergies as of 07/09/2014  . (No Known Allergies)    Family History  Problem Relation Age of Onset  . Colon polyps Father   . Colon cancer Neg Hx     History   Social History  . Marital Status: Married    Spouse Name: N/A    Number of Children: 3  . 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 Smoking status: Former Smoker    Types: Pipe    Quit date: 12/06/2013  . Smokeless tobacco: Never Used  . Alcohol Use: No     Comment: no alcohol since March per pt.  . Drug Use: No  . Sexual Activity: Not on file   Other Topics Concern  . Not on file   Social History Narrative  . No narrative on file       Physical Exam: BP 166/90  Pulse 80  Ht 6' (1.829 m)  Wt 286 lb 9.6 oz (130.001 kg)  BMI 38.86 kg/m2 Constitutional: generally well-appearing Psychiatric: alert and oriented x3 Eyes: extraocular movements intact Mouth: oral pharynx moist, no lesions Neck: supple no lymphadenopathy Cardiovascular: heart regular rate and rhythm Lungs: clear to auscultation bilaterally Abdomen: soft, nontender, nondistended, no obvious ascites, no peritoneal signs, normal bowel sounds Extremities: no lower extremity edema bilaterally Skin: no lesions on visible extremities    Assessment and plan: 53 y.o. male with  routine risk for colon cancer, new kidney failure being worked up for kidney [facility] at [facility]  He is at routine risk for colon cancer and has not had cancer screening yet. I recommend colonoscopy at his soonest convenience. He understands the risks and potential benefits and we'll proceed with that at his soonest  convenience.

## 2014-07-09 NOTE — Telephone Encounter (Signed)
Meter left at the front counter

## 2014-07-09 NOTE — Telephone Encounter (Signed)
Patient would like to know if we have a meter for him   Please advise patient    Thank you

## 2014-07-09 NOTE — Patient Instructions (Signed)
You will be set up for a colonoscopy ([facility]) for colon cancer screening.

## 2014-07-19 ENCOUNTER — Encounter: Payer: Self-pay | Admitting: Gastroenterology

## 2014-07-23 ENCOUNTER — Encounter: Payer: Self-pay | Admitting: Gastroenterology

## 2014-07-23 ENCOUNTER — Ambulatory Visit (AMBULATORY_SURGERY_CENTER): Payer: BC Managed Care – PPO | Admitting: Gastroenterology

## 2014-07-23 VITALS — BP 143/88 | HR 67 | Temp 96.7°F | Resp 19 | Ht 72.0 in | Wt 286.0 lb

## 2014-07-23 DIAGNOSIS — D125 Benign neoplasm of sigmoid colon: Secondary | ICD-10-CM

## 2014-07-23 DIAGNOSIS — K5730 Diverticulosis of large intestine without perforation or abscess without bleeding: Secondary | ICD-10-CM

## 2014-07-23 DIAGNOSIS — D123 Benign neoplasm of transverse colon: Secondary | ICD-10-CM

## 2014-07-23 DIAGNOSIS — Z1211 Encounter for screening for malignant neoplasm of colon: Secondary | ICD-10-CM

## 2014-07-23 DIAGNOSIS — K635 Polyp of colon: Secondary | ICD-10-CM

## 2014-07-23 HISTORY — DX: Polyp of colon: K63.5

## 2014-07-23 LAB — GLUCOSE, CAPILLARY
GLUCOSE-CAPILLARY: 103 mg/dL — AB (ref 70–99)
Glucose-Capillary: 124 mg/dL — ABNORMAL HIGH (ref 70–99)

## 2014-07-23 MED ORDER — SODIUM CHLORIDE 0.9 % IV SOLN: 500.0000 mL | INTRAVENOUS | Status: DC

## 2014-07-23 NOTE — Progress Notes (Signed)
A/ox3 pleased with MAC, report to Jane RN 

## 2014-07-23 NOTE — Patient Instructions (Signed)
YOU HAD AN ENDOSCOPIC PROCEDURE TODAY AT THE [facility]: Refer to the procedure report that was given to you for any specific questions about what was found during the examination.  If the procedure report does not answer your questions, please call your gastroenterologist to clarify.  If you requested that your care partner not be given the details of your procedure findings, then the procedure report has been included in a sealed envelope for you to review at your convenience later.  YOU SHOULD EXPECT: Some feelings of bloating in the abdomen. Passage of more gas than usual.  Walking can help get rid of the air that was put into your GI tract during the procedure and reduce the bloating. If you had a lower endoscopy (such as a colonoscopy or flexible sigmoidoscopy) you may notice spotting of blood in your stool or on the toilet paper. If you underwent a bowel prep for your procedure, then you may not have a normal bowel movement for a few days.  DIET: Your first meal following the procedure should be a light meal and then it is ok to progress to your normal diet.  A half-sandwich or bowl of soup is an example of a good first meal.  Heavy or fried foods are harder to digest and may make you feel nauseous or bloated.  Likewise meals heavy in dairy and vegetables can cause extra gas to form and this can also increase the bloating.  Drink plenty of fluids but you should avoid alcoholic beverages for 24 hours.  ACTIVITY: Your care partner should take you home directly after the procedure.  You should plan to take it easy, moving slowly for the rest of the day.  You can resume normal activity the day after the procedure however you should NOT DRIVE or use heavy machinery for 24 hours (because of the sedation medicines used during the test).    SYMPTOMS TO REPORT IMMEDIATELY: A gastroenterologist can be reached at any hour.  During normal business hours, 8:30 AM to 5:00 PM Monday through Friday,  call [phone].  After hours and on weekends, please call the GI answering service at [phone] who will take a message and have the physician on call contact you.   Following lower endoscopy (colonoscopy or flexible sigmoidoscopy):  Excessive amounts of blood in the stool  Significant tenderness or worsening of abdominal pains  Swelling of the abdomen that is new, acute  Fever of 100F or higher  FOLLOW UP: If any biopsies were taken you will be contacted by phone or by letter within the next 1-3 weeks.  Call your gastroenterologist if you have not heard about the biopsies in 3 weeks.  Our staff will call the home number listed on your records the next business day following your procedure to check on you and address any questions or concerns that you may have at that time regarding the information given to you following your procedure. This is a courtesy call and so if there is no answer at the home number and we have not heard from you through the emergency physician on call, we will assume that you have returned to your regular daily activities without incident.  SIGNATURES/CONFIDENTIALITY: You and/or your care partner have signed paperwork which will be entered into your electronic medical record.  These signatures attest to the fact that that the information above on your After Visit Summary has been reviewed and is understood.  Full responsibility of the confidentiality of this   discharge information lies with you and/or your care-partner.  Polyp, diverticulosis and high fiber diet information given.  Dr. Ardis Hughs will advise you about timing for next colonoscopy after pathology reports are reviewed.

## 2014-07-23 NOTE — Op Note (Signed)
[facility]  [address] [place], [address]   COLONOSCOPY PROCEDURE REPORT  PATIENT: Robert [name], Robert [name]  MR#: [id number] BIRTHDATE: 1961/06/12 , 55  yrs. old GENDER: male ENDOSCOPIST: [name], MD REFERRED [id number] Kindl, M.D. PROCEDURE DATE:  07/23/2014 PROCEDURE:   Colonoscopy with snare polypectomy First Screening Colonoscopy - Avg.  risk and is 50 yrs.  old or older Yes.  Prior Negative Screening - Now for repeat screening. N/A  History of Adenoma - Now for follow-up colonoscopy & has been > or = to 3 yrs.  N/A  Polyps Removed Today? Yes. ASA CLASS:   Class III INDICATIONS:average risk for colon cancer. MEDICATIONS: Monitored anesthesia care and Propofol 350 mg IV  DESCRIPTION OF PROCEDURE:   After the risks benefits and alternatives of the procedure were thoroughly explained, informed consent was obtained.  The digital rectal exam revealed no abnormalities of the rectum.   The [id number]  endoscope was introduced through the anus and advanced to the cecum, which was identified by both the appendix and ileocecal valve. No adverse events experienced.   The quality of the prep was excellent.  The instrument was then slowly withdrawn as the colon was fully examined.  COLON FINDINGS: Three semi-pedunculated polyps ranging between 5-79mm in size were found in the sigmoid colon and transverse colon. Polypectomies were performed with a cold snare.  The resection was complete, the polyp tissue was completely retrieved and sent to histology.   There was mild diverticulosis noted in the left colon. The examination was otherwise normal.  Retroflexed views revealed no abnormalities. The time to cecum=5 minutes 07 seconds. Withdrawal time=17 minutes 40 seconds.  The scope was withdrawn and the procedure completed. COMPLICATIONS: There were no immediate complications.  ENDOSCOPIC IMPRESSION: 1.   Three semi-pedunculated polyps ranging between 5-59mm in  size were found in the sigmoid colon and transverse colon; polypectomies were performed with a cold snare 2.   Mild diverticulosis was noted in the left colon 3.   The examination was otherwise normal  RECOMMENDATIONS: If the polyp(s) removed today are proven to be adenomatous (pre-cancerous) polyps, you will need a colonoscopy in 3 years. Otherwise you should continue to follow colorectal cancer screening guidelines for "routine risk" patients with a colonoscopy in 10 years.  You will receive a letter within 1-2 weeks with the results of your biopsy as well as final recommendations.  Please call my office if you have not received a letter after 3 weeks.  eSigned:  [name], MD 07/23/2014 12:01 PM

## 2014-07-23 NOTE — Progress Notes (Signed)
Called to room to assist during endoscopic procedure.  Patient ID and intended procedure confirmed with present staff. Received instructions for my participation in the procedure from the performing physician.  

## 2014-07-26 ENCOUNTER — Telehealth: Payer: Self-pay | Admitting: *Deleted

## 2014-07-26 ENCOUNTER — Telehealth: Payer: Self-pay | Admitting: Endocrinology

## 2014-07-26 ENCOUNTER — Other Ambulatory Visit: Payer: Self-pay | Admitting: *Deleted

## 2014-07-26 MED ORDER — INSULIN GLARGINE 100 UNIT/ML SOLOSTAR PEN: 22.0000 [IU] | PEN_INJECTOR | Freq: Every day | SUBCUTANEOUS | Status: DC

## 2014-07-26 NOTE — Telephone Encounter (Signed)
  Follow up Call-  Call back number 07/23/2014  Post procedure Call Back phone  # [phone]  Permission to leave phone message Yes     Patient questions:  Do you have a fever, pain , or abdominal swelling? No. Pain Score  0 *  Have you tolerated food without any problems? Yes.    Have you been able to return to your normal activities? Yes.    Do you have any questions about your discharge instructions: Diet   No. Medications  No. Follow up visit  No.  Do you have questions or concerns about your Care? No.  Actions: * If pain score is 4 or above: No action needed, pain <4.

## 2014-07-26 NOTE — Telephone Encounter (Signed)
rx sent

## 2014-07-26 NOTE — Telephone Encounter (Signed)
lantus rx needs to be sent to pharmacy 22 u daily with the pen

## 2014-07-29 ENCOUNTER — Encounter: Payer: Self-pay | Admitting: Gastroenterology

## 2014-07-30 DIAGNOSIS — I10 Essential (primary) hypertension: Secondary | ICD-10-CM | POA: Diagnosis present

## 2014-07-30 HISTORY — DX: Essential (primary) hypertension: I10

## 2014-07-31 ENCOUNTER — Emergency Department (HOSPITAL_COMMUNITY)
Admission: EM | Admit: 2014-07-31 | Discharge: 2014-07-31 | Disposition: A | Discharge status: Home / Self Care | Payer: BC Managed Care – PPO | Attending: Emergency Medicine | Admitting: Emergency Medicine

## 2014-07-31 ENCOUNTER — Encounter (HOSPITAL_COMMUNITY): Payer: Self-pay | Admitting: Emergency Medicine

## 2014-07-31 ENCOUNTER — Emergency Department (HOSPITAL_COMMUNITY): Payer: BC Managed Care – PPO

## 2014-07-31 ENCOUNTER — Other Ambulatory Visit (HOSPITAL_COMMUNITY): Payer: BC Managed Care – PPO

## 2014-07-31 DIAGNOSIS — E119 Type 2 diabetes mellitus without complications: Secondary | ICD-10-CM | POA: Diagnosis not present

## 2014-07-31 DIAGNOSIS — Z862 Personal history of diseases of the blood and blood-forming organs and certain disorders involving the immune mechanism: Secondary | ICD-10-CM | POA: Insufficient documentation

## 2014-07-31 DIAGNOSIS — Z859 Personal history of malignant neoplasm, unspecified: Secondary | ICD-10-CM | POA: Diagnosis not present

## 2014-07-31 DIAGNOSIS — R109 Unspecified abdominal pain: Secondary | ICD-10-CM

## 2014-07-31 DIAGNOSIS — N184 Chronic kidney disease, stage 4 (severe): Secondary | ICD-10-CM | POA: Insufficient documentation

## 2014-07-31 DIAGNOSIS — Z7982 Long term (current) use of aspirin: Secondary | ICD-10-CM | POA: Diagnosis not present

## 2014-07-31 DIAGNOSIS — E785 Hyperlipidemia, unspecified: Secondary | ICD-10-CM | POA: Insufficient documentation

## 2014-07-31 DIAGNOSIS — Z79899 Other long term (current) drug therapy: Secondary | ICD-10-CM | POA: Diagnosis not present

## 2014-07-31 DIAGNOSIS — Z8701 Personal history of pneumonia (recurrent): Secondary | ICD-10-CM | POA: Insufficient documentation

## 2014-07-31 DIAGNOSIS — I1310 Hypertensive heart and chronic kidney disease without heart failure, with stage 1 through stage 4 chronic kidney disease, or unspecified chronic kidney disease: Secondary | ICD-10-CM | POA: Diagnosis not present

## 2014-07-31 DIAGNOSIS — Z87891 Personal history of nicotine dependence: Secondary | ICD-10-CM | POA: Insufficient documentation

## 2014-07-31 DIAGNOSIS — Z794 Long term (current) use of insulin: Secondary | ICD-10-CM | POA: Insufficient documentation

## 2014-07-31 DIAGNOSIS — E039 Hypothyroidism, unspecified: Secondary | ICD-10-CM | POA: Diagnosis not present

## 2014-07-31 DIAGNOSIS — K659 Peritonitis, unspecified: Secondary | ICD-10-CM | POA: Diagnosis not present

## 2014-07-31 DIAGNOSIS — Z9981 Dependence on supplemental oxygen: Secondary | ICD-10-CM | POA: Insufficient documentation

## 2014-07-31 DIAGNOSIS — K219 Gastro-esophageal reflux disease without esophagitis: Secondary | ICD-10-CM | POA: Diagnosis not present

## 2014-07-31 DIAGNOSIS — Z7952 Long term (current) use of systemic steroids: Secondary | ICD-10-CM | POA: Diagnosis not present

## 2014-07-31 DIAGNOSIS — Z85828 Personal history of other malignant neoplasm of skin: Secondary | ICD-10-CM | POA: Insufficient documentation

## 2014-07-31 LAB — GRAM STAIN

## 2014-07-31 LAB — COMPREHENSIVE METABOLIC PANEL
ALK PHOS: 50 U/L (ref 39–117)
ALT: 11 U/L (ref 0–53)
AST: 12 U/L (ref 0–37)
Albumin: 3.4 g/dL — ABNORMAL LOW (ref 3.5–5.2)
Anion gap: 22 — ABNORMAL HIGH (ref 5–15)
BILIRUBIN TOTAL: 0.4 mg/dL (ref 0.3–1.2)
BUN: 62 mg/dL — ABNORMAL HIGH (ref 6–23)
CHLORIDE: 92 meq/L — AB (ref 96–112)
CO2: 22 meq/L (ref 19–32)
Calcium: 8.4 mg/dL (ref 8.4–10.5)
Creatinine, Ser: 14.23 mg/dL — ABNORMAL HIGH (ref 0.50–1.35)
GFR, EST AFRICAN AMERICAN: 4 mL/min — AB (ref >90)
GFR, EST NON AFRICAN AMERICAN: 3 mL/min — AB (ref >90)
GLUCOSE: 193 mg/dL — AB (ref 70–99)
POTASSIUM: 3.8 meq/L (ref 3.7–5.3)
SODIUM: 136 meq/L — AB (ref 137–147)
Total Protein: 6.9 g/dL (ref 6.0–8.3)

## 2014-07-31 LAB — ALBUMIN, FLUID (OTHER): Albumin, Fluid: 0.2 g/dL

## 2014-07-31 LAB — BODY FLUID CELL COUNT WITH DIFFERENTIAL
Lymphs, Fluid: 4 %
Monocyte-Macrophage-Serous Fluid: 25 % — ABNORMAL LOW (ref 50–90)
Neutrophil Count, Fluid: 71 % — ABNORMAL HIGH (ref 0–25)
Total Nucleated Cell Count, Fluid: 2102 cu mm — ABNORMAL HIGH (ref 0–1000)

## 2014-07-31 LAB — URINALYSIS, ROUTINE W REFLEX MICROSCOPIC
BILIRUBIN URINE: NEGATIVE
GLUCOSE, UA: 250 mg/dL — AB
KETONES UR: NEGATIVE mg/dL
Leukocytes, UA: NEGATIVE
Nitrite: NEGATIVE
PH: 5 (ref 5.0–8.0)
Protein, ur: >300 mg/dL — AB
Specific Gravity, Urine: 1.016 (ref 1.005–1.030)
Urobilinogen, UA: 0.2 mg/dL (ref 0.0–1.0)

## 2014-07-31 LAB — CBC WITH DIFFERENTIAL/PLATELET
BASOS ABS: 0 10*3/uL (ref 0.0–0.1)
Basophils Relative: 0 % (ref 0–1)
EOS PCT: 2 % (ref 0–5)
Eosinophils Absolute: 0.2 10*3/uL (ref 0.0–0.7)
HCT: 25.5 % — ABNORMAL LOW (ref 39.0–52.0)
Hemoglobin: 8.5 g/dL — ABNORMAL LOW (ref 13.0–17.0)
LYMPHS ABS: 0.8 10*3/uL (ref 0.7–4.0)
LYMPHS PCT: 7 % — AB (ref 12–46)
MCH: 28.6 pg (ref 26.0–34.0)
MCHC: 33.3 g/dL (ref 30.0–36.0)
MCV: 85.9 fL (ref 78.0–100.0)
Monocytes Absolute: 0.6 10*3/uL (ref 0.1–1.0)
Monocytes Relative: 5 % (ref 3–12)
NEUTROS ABS: 9.2 10*3/uL — AB (ref 1.7–7.7)
NEUTROS PCT: 86 % — AB (ref 43–77)
PLATELETS: 188 10*3/uL (ref 150–400)
RBC: 2.97 MIL/uL — AB (ref 4.22–5.81)
RDW: 15.1 % (ref 11.5–15.5)
WBC: 10.7 10*3/uL — AB (ref 4.0–10.5)

## 2014-07-31 LAB — PROTEIN, BODY FLUID: TOTAL PROTEIN, FLUID: 0.2 g/dL

## 2014-07-31 LAB — LIPASE, BLOOD: Lipase: 84 U/L — ABNORMAL HIGH (ref 11–59)

## 2014-07-31 LAB — URINE MICROSCOPIC-ADD ON

## 2014-07-31 LAB — LACTATE DEHYDROGENASE, PLEURAL OR PERITONEAL FLUID: LD FL: 23 U/L (ref 3–23)

## 2014-07-31 LAB — I-STAT CG4 LACTIC ACID, ED: Lactic Acid, Venous: 1.62 mmol/L (ref 0.5–2.2)

## 2014-07-31 MED ORDER — VANCOMYCIN HCL 1000 MG IV SOLR: 15.0000 mg/kg | Freq: Once | INTRAVENOUS | Status: DC

## 2014-07-31 MED ORDER — DEXTROSE 5 % IV SOLN
2.0000 g | Freq: Once | INTRAVENOUS | Status: AC
  Administered 2014-07-31: 2 g via INTRAVENOUS
  Filled 2014-07-31: qty 2

## 2014-07-31 MED ORDER — IOHEXOL 300 MG/ML  SOLN: 25.0000 mL | INTRAMUSCULAR | Status: AC

## 2014-07-31 MED ORDER — ACETAMINOPHEN 500 MG PO TABS
1000.0000 mg | ORAL_TABLET | Freq: Once | ORAL | Status: AC
  Administered 2014-07-31: 1000 mg via ORAL
  Filled 2014-07-31: qty 2

## 2014-07-31 MED ORDER — VANCOMYCIN HCL 10 G IV SOLR
2000.0000 mg | Freq: Once | INTRAVENOUS | Status: AC
  Administered 2014-07-31: 2000 mg via INTRAVENOUS
  Filled 2014-07-31: qty 2000

## 2014-07-31 NOTE — ED Notes (Signed)
CT informed patient done with contrast.

## 2014-07-31 NOTE — Progress Notes (Signed)
Patient doesn't have any dwelling fluid,as per MD order,on strict sterile set-up, infused one liter of 1.5% Low mag,Low cal fluid,dwell for 30 mins and drained out.Obtained 150 cc peritoneal fluid,drained fluid is yellow cloudy and with a large specks of fibrin noted.Procedure well tolerated by the patient.Orders done and carried out.Md made aware on cloudy with fibrin specks fluid.

## 2014-07-31 NOTE — ED Notes (Signed)
Pt in c/o abd pain, states he is a peritoneal dialysis patient and is concerned he has peritonitis, pain started last night, was unable to complete normal treatment last night, pt visibly uncomfortable

## 2014-07-31 NOTE — ED Notes (Signed)
Patient returned from [place]. Waiting on medication for Pharmacy.

## 2014-07-31 NOTE — Discharge Instructions (Signed)
Abdominal Pain Many things can cause abdominal pain. Usually, abdominal pain is not caused by a disease and will improve without treatment. It can often be observed and treated at home. Your health care provider will do a physical exam and possibly order blood tests and X-rays to help determine the seriousness of your pain. However, in many cases, more time must pass before a clear cause of the pain can be found. Before that point, your health care provider may not know if you need more testing or further treatment. HOME CARE INSTRUCTIONS  Monitor your abdominal pain for any changes. The following actions may help to alleviate any discomfort you are experiencing:  Only take over-the-counter or prescription medicines as directed by your health care provider.  Do not take laxatives unless directed to do so by your health care provider.  Try a clear liquid diet (broth, tea, or water) as directed by your health care provider. Slowly move to a bland diet as tolerated. SEEK MEDICAL CARE IF:  You have unexplained abdominal pain.  You have abdominal pain associated with nausea or diarrhea.  You have pain when you urinate or have a bowel movement.  You experience abdominal pain that wakes you in the night.  You have abdominal pain that is worsened or improved by eating food.  You have abdominal pain that is worsened with eating fatty foods.  You have a fever. SEEK IMMEDIATE MEDICAL CARE IF:   Your pain does not go away within 2 hours.  You keep throwing up (vomiting).  Your pain is felt only in portions of the abdomen, such as the right side or the left lower portion of the abdomen.  You pass bloody or black tarry stools. MAKE SURE YOU:  Understand these instructions.   Will watch your condition.   Will get help right away if you are not doing well or get worse.  Document Released: 07/04/2005 Document Revised: 09/29/2013 Document Reviewed: 06/03/2013 [facility] Patient Information  2015 [place], [place]. This information is not intended to replace advice given to you by your health care provider. Make sure you discuss any questions you have with your health care provider.   Peritonitis Peritonitis is inflammation (the body's way of reacting to injury and/or infection) of the peritoneum. The peritoneum is the tissue that lines the abdomen and covers the internal organs. CAUSES   Conditions or injuries cause organs to leak stool, bacteria, fungi or chemicals (such as bile or other digestive juices) into the abdomen. When these substances come into contact with the peritoneum, they may cause irritation or infection.  Many conditions can cause peritonitis, including:  Pancreatitis.  Diverticulitis.  Appendicitis.  Ulcers.  Crohn's disease or ulcerative colitis.  Other infections inside the abdomen or pelvis.  Abdominal injury.  Injury to the stomach or esophagus (food pipe).  Cancer.  Liver disease.  Peritoneal dialysis (a procedure used to cleanse the blood when the kidneys are unable to do so).  Tuberculosis. SYMPTOMS  Symptoms of peritonitis may include:  Severe pain.  Abdominal swelling.  Fever and chills.  Nausea and vomiting.  Poor or no appetite.  Inability to pass gas or stool. DIAGNOSIS  Diagnosis begins with a complete physical exam. The abdomen may be tender or hard and board-like. Testing may include:  Blood tests.  Urinalysis  Stool analysis (if there is associated diarrhea).  Paracentesis: a procedure where a very thin needle may be used to take a sample of fluid from within the abdomen. This fluid can  be examined for signs of infection.  X-ray.  Ultrasound.  CT scans. TREATMENT  This must be treated quickly, to avoid a severe, life-threatening infection that involves the whole body (sepsis). This must be done in the hospital. Treatment for peritonitis may include:  Antibiotics.  Surgery to remove infected fluid and  tissue.  Surgery to treat conditions that cause peritonitis (such as an appendectomy for appendicitis). HOME CARE INSTRUCTIONS  Even after successful treatment in the hospital, treatment and recovery will continue at home. It is important to:  Take medicines (such as antibiotics) exactly as prescribed by your caregiver.  If you were taking prescription medicines for other problems before you developed peritonitis, ask about when you should re-start these medicines.  Only take over-the-counter or prescription medicines for pain, discomfort or fever as directed by your caregiver.  Follow your prescribed diet. You might need a higher fiber diet to help avoid constipation.  Drink extra water.  Use a stool softener or laxative, if recommended.  Get extra rest. [place] IF:  You have increased abdominal pain or tenderness, or abdominal swelling.  You develop an unexplained oral temperature above 102 F (38.9 C).  You start to have the chills.  You have new problems with passing urine.  You develop chest pains and/or shortness of breath.  You experience nausea, vomiting or diarrhea.  You are unable to pass gas or stool.  You have had surgery and notice the healing incision has become hot, red, swollen or is leaking pus or blood. Document Released: 09/06/2008 Document Revised: 12/17/2011 Document Reviewed: 09/06/2008 [facility] Patient Information 2015 [place], [place]. This information is not intended to replace advice given to you by your health care provider. Make sure you discuss any questions you have with your health care provider.

## 2014-07-31 NOTE — ED Notes (Signed)
RN from floor at bedside drawing peritoneal fluid.

## 2014-07-31 NOTE — ED Notes (Signed)
Peritoneal Dialysis nurse left patient beside at this time.

## 2014-07-31 NOTE — ED Notes (Signed)
Peritoneal Dialysis nurse at the bedside.

## 2014-07-31 NOTE — ED Notes (Addendum)
Peritoneal Dialysis Nurse remains at the bedside. Sign at the doorway states, "Do NOT Enter."

## 2014-07-31 NOTE — ED Notes (Signed)
Asked for urinalysis pt stated he just got off dialysis but he will try

## 2014-07-31 NOTE — ED Provider Notes (Signed)
CSN: [id number]     Arrival date & time 07/31/14  1600 History   First MD Initiated Contact with Patient 07/31/14 1615     Chief Complaint  Patient presents with  . Abdominal Pain     (Consider location/radiation/quality/duration/timing/severity/associated sxs/prior Treatment) HPI 53 year old male presents with lower abdominal pain has been worsening since this morning. Yesterday he felt fine. Today he's had some chills states he had a temperature of 99.8. He is concerned that he has peritonitis. He has a history of end-stage renal disease and is currently on peritoneal dialysis. His dialysate was not significantly different today. He denies a cough. No diarrhea or vomiting. Rates the pain is a moderate pain. Leg down makes it worse. Has never had pain when this before and has never had peritonitis.  Past Medical History  Diagnosis Date  . Diabetes mellitus without complication   . Hypertension   . Thyroid disease   . Sleep apnea 06/18/11    split-night sleep study- [facility].  . Hyperlipidemia   . PAT (paroxysmal atrial tachycardia)   . PSVT (paroxysmal supraventricular tachycardia)   . Blood transfusion without reported diagnosis   . Chronic kidney disease     Stage IV  . CPAP (continuous positive airway pressure) dependence   . Dysrhythmia   . Shortness of breath   . Hypothyroidism   . GERD (gastroesophageal reflux disease)     n/v a lot  . Headache(784.0)     slight dull h/a due kidney failure  . Cancer     skin ca right shoulder  . Anemia     low hgb at present  . Umbilical hernia     will repair with thi surgery   Past Surgical History  Procedure Laterality Date  . Nephrectomy Left 1974  . Renal biopsy Right 2012  . Skin cancer excision    . Capd insertion N/A 05/18/2014    Procedure: LAPAROSCOPIC INSERTION CONTINUOUS AMBULATORY PERITONEAL DIALYSIS  (CAPD) CATHETER;  Surgeon: Ralene Ok, MD;  Location: [place];  Service: General;   Laterality: N/A;  . Umbilical hernia repair N/A 05/18/2014    Procedure: HERNIA REPAIR UMBILICAL ADULT;  Surgeon: Ralene Ok, MD;  Location: [place];  Service: General;  Laterality: N/A;  . Laparoscopic repositioning capd catheter N/A 06/16/2014    Procedure: LAPAROSCOPIC REPOSITIONING CAPD CATHETER;  Surgeon: Ralene Ok, MD;  Location: [place];  Service: General;  Laterality: N/A;   Family History  Problem Relation Age of Onset  . Colon polyps Father   . Colon cancer Neg Hx   . Stomach cancer Neg Hx    History  Substance Use Topics  . Smoking status: Former Smoker    Types: Pipe    Quit date: 12/06/2013  . Smokeless tobacco: Never Used  . Alcohol Use: 0.6 oz/week    1 Glasses of wine per week     Comment: 1 glass of wine per week    Review of Systems  Constitutional: Positive for fever and chills.  Respiratory: Negative for cough and shortness of breath.   Gastrointestinal: Positive for abdominal pain. Negative for nausea, vomiting, diarrhea and constipation.  Genitourinary: Negative for dysuria.  All other systems reviewed and are negative.     Allergies  Review of patient's allergies indicates no known allergies.  Home Medications   Prior to Admission medications   Medication Sig Start Date End Date Taking? Authorizing Provider  aspirin 81 MG tablet Take 81 mg by mouth every morning.  Historical Provider, MD  BYSTOLIC 10 MG tablet Take 15 mg by mouth every morning.  08/10/13   Historical Provider, MD  calcium carbonate (TUMS - DOSED IN MG ELEMENTAL CALCIUM) 500 MG chewable tablet Chew 4 tablets (800 mg of elemental calcium total) by mouth 3 (three) times daily with meals. 06/21/14   Marlena Clipper, NP  cetirizine (ZYRTEC) 10 MG tablet Take 10 mg by mouth every morning.     Historical Provider, MD  CRESTOR 20 MG tablet TAKE 1 TABLET (20 MG TOTAL) BY MOUTH DAILY. 06/28/14   Elayne Snare, MD  docusate sodium (COLACE) 100 MG capsule Take 100 mg by mouth every  evening.     Historical Provider, MD  ezetimibe (ZETIA) 10 MG tablet Take 10 mg by mouth every evening.     Historical Provider, MD  fluocinonide cream (LIDEX) [id number] % Apply 1 application topically daily as needed (Skin inflammation (hand, thigh, back)).     Historical Provider, MD  gentamicin cream (GARAMYCIN) 0.1 % Apply 1 application topically once a week. Apply weekly to peritoneal catheter 06/02/14   Historical Provider, MD  hydrALAZINE (APRESOLINE) 50 MG tablet Take 100 mg by mouth 2 (two) times daily.  08/10/13   Historical Provider, MD  Insulin Glargine (LANTUS SOLOSTAR) 100 UNIT/ML Solostar Pen Inject 22 Units into the skin daily. 07/26/14   Elayne Snare, MD  Insulin Pen Needle 32G X 4 MM MISC Use 4 pen needles per day to inject insulin 01/05/14   Elayne Snare, MD  insulin regular human CONCENTRATED (HUMULIN R) 500 UNIT/ML SOLN injection Inject 5 Units into the skin 3 (three) times daily with meals.    Historical Provider, MD  lactulose, encephalopathy, (CHRONULAC) 10 GM/15ML SOLN  05/24/14   Historical Provider, MD  levothyroxine (SYNTHROID, LEVOTHROID) 100 MCG tablet Take 1 tablet (100 mcg total) by mouth daily. 01/05/14   Elayne Snare, MD  NON FORMULARY every evening. CPAP therapy.    Historical Provider, MD  [place]    Historical Provider, MD  polyethylene glycol (MIRALAX / GLYCOLAX) packet Take 17 g by mouth daily.    Historical Provider, MD  senna (SENOKOT) 8.6 MG tablet Take 1 tablet by mouth every evening.     Historical Provider, MD  sildenafil (VIAGRA) 100 MG tablet Take 100 mg by mouth daily as needed for erectile dysfunction.    Historical Provider, MD  tamsulosin (FLOMAX) 0.4 MG CAPS capsule Take 1 capsule (0.4 mg total) by mouth daily. 06/21/14   Marlena Clipper, NP   BP 112/85  Pulse 88  Temp(Src) 98.2 F (36.8 C) (Oral)  Resp 26  SpO2 99% Physical Exam  Nursing note and vitals reviewed. Constitutional: He is oriented to person, place, and time. He  appears well-developed and well-nourished.  HENT:  Head: Normocephalic and atraumatic.  Right Ear: External ear normal.  Left Ear: External ear normal.  Nose: Nose normal.  Eyes: Right eye exhibits no discharge. Left eye exhibits no discharge.  Neck: Neck supple.  Cardiovascular: Normal rate, regular rhythm, normal heart sounds and intact distal pulses.   Pulmonary/Chest: Effort normal.  Abdominal: Soft. There is tenderness in the right lower quadrant, periumbilical area, suprapubic area and left lower quadrant.    Musculoskeletal: He exhibits no edema.  Neurological: He is alert and oriented to person, place, and time.  Skin: Skin is warm and dry.    ED Course  Procedures (including critical care time) Labs Review Labs Reviewed  CBC WITH DIFFERENTIAL -  Abnormal; Notable for the following:    WBC 10.7 (*)    RBC 2.97 (*)    Hemoglobin 8.5 (*)    HCT 25.5 (*)    Neutrophils Relative % 86 (*)    Neutro Abs 9.2 (*)    Lymphocytes Relative 7 (*)    All other components within normal limits  COMPREHENSIVE METABOLIC PANEL - Abnormal; Notable for the following:    Sodium 136 (*)    Chloride 92 (*)    Glucose, Bld 193 (*)    BUN 62 (*)    Creatinine, Ser 14.23 (*)    Albumin 3.4 (*)    GFR calc non Af Amer 3 (*)    GFR calc Af Amer 4 (*)    Anion gap 22 (*)    All other components within normal limits  LIPASE, BLOOD - Abnormal; Notable for the following:    Lipase 84 (*)    All other components within normal limits  URINALYSIS, ROUTINE W REFLEX MICROSCOPIC - Abnormal; Notable for the following:    APPearance HAZY (*)    Glucose, UA 250 (*)    Hgb urine dipstick TRACE (*)    Protein, ur >300 (*)    All other components within normal limits  BODY FLUID CELL COUNT WITH DIFFERENTIAL - Abnormal; Notable for the following:    Appearance, Fluid CLOUDY (*)    WBC, Fluid 2102 (*)    Neutrophil Count, Fluid 71 (*)    Monocyte-Macrophage-Serous Fluid 25 (*)    All other  components within normal limits  URINE MICROSCOPIC-ADD ON - Abnormal; Notable for the following:    Casts HYALINE CASTS (*)    All other components within normal limits  GRAM STAIN  CULTURE, BLOOD (ROUTINE X 2)  CULTURE, BLOOD (ROUTINE X 2)  BODY FLUID CULTURE  LACTATE DEHYDROGENASE, BODY FLUID  PROTEIN, BODY FLUID  ALBUMIN, FLUID  GLUCOSE, PERITONEAL FLUID  I-STAT CG4 LACTIC ACID, ED    Imaging Review Ct Abdomen Pelvis Wo Contrast  07/31/2014   CLINICAL DATA:  Lower abdominal pain since last night. Unable to complete routine peritoneal dialysis at home. Diaphoretic.  EXAM: CT ABDOMEN AND PELVIS WITHOUT CONTRAST  TECHNIQUE: Multidetector CT imaging of the abdomen and pelvis was performed following the standard protocol without IV contrast.  COMPARISON:  06/12/2014  FINDINGS: Bibasilar atelectasis.  No effusions.  Heart is normal size.  Liver, gallbladder, spleen, pancreas, adrenals and right kidney have an unremarkable unenhanced appearance. Small 17 mm nodular area in the left renal fossa is stable. Left Kidney not visualized.  There is descending colonic and sigmoid diverticulosis. Mild inflammatory changes noted around the distal descending colon compatible with active diverticulitis. Appendix is visualized and is normal. Stomach and small bowel are unremarkable.  Peritoneal dialysis catheter is in place within the pelvis. No free fluid, free air or adenopathy. Urinary bladder is grossly unremarkable.  Small bilateral inguinal hernias and umbilical hernia containing fat.  IMPRESSION: Left colonic diverticulosis. Mild inflammatory change around the distal descending colon compatible with active diverticulitis.   Electronically Signed   By: Rolm Baptise M.D.   On: 07/31/2014 19:43     EKG Interpretation None      MDM   Final diagnoses:  Abdominal pain  Peritonitis    Peritoneal dialysate is cloudy when drawn by renal nurse, high concern for peritonitis. D/w renal, who recommends IV  fortaz and vanc now, and f/u with PD nurse in 2 days for further antibiotics. Questionable diverticulitis, given  gram neg coverage with fortaz I believe he is still covered. Pain gone after tylenol. Renal recommends outpatient management, and patient does not appear ill and is stable for discharge. Discussed strict return precautions.    [name] Hamburger, MD 08/01/14 [phone]

## 2014-07-31 NOTE — ED Notes (Signed)
Called CT to allow them to know patient is ready for CT.

## 2014-07-31 NOTE — ED Notes (Signed)
Pt states he is having lower abdominal pain since last night, was unable to complete routine peritoneal dialysis at home. Pt is diaphoretic and pale.

## 2014-08-01 LAB — GLUCOSE, PERITONEAL FLUID: Glucose, Peritoneal Fluid: 717 mg/dL

## 2014-08-02 LAB — PATHOLOGIST SMEAR REVIEW

## 2014-08-03 NOTE — Telephone Encounter (Signed)
Patient need refill of Verio IQ test strips 90 supply, [facility]

## 2014-08-04 ENCOUNTER — Other Ambulatory Visit: Payer: Self-pay | Admitting: *Deleted

## 2014-08-04 LAB — BODY FLUID CULTURE: CULTURE: NO GROWTH

## 2014-08-04 MED ORDER — GLUCOSE BLOOD VI STRP: ORAL_STRIP | Status: DC

## 2014-08-04 NOTE — Telephone Encounter (Signed)
rx sent

## 2014-08-06 LAB — CULTURE, BLOOD (ROUTINE X 2): Culture: NO GROWTH

## 2014-08-07 LAB — CULTURE, BLOOD (ROUTINE X 2): CULTURE: NO GROWTH

## 2014-08-09 ENCOUNTER — Other Ambulatory Visit: Payer: Self-pay | Admitting: Endocrinology

## 2014-08-09 ENCOUNTER — Other Ambulatory Visit: Payer: BC Managed Care – PPO

## 2014-08-09 LAB — TSH: TSH: 4.19 u[IU]/mL (ref 0.350–4.500)

## 2014-08-09 LAB — LIPID PANEL
Cholesterol: 113 mg/dL (ref 0–200)
HDL: 40 mg/dL (ref >39)
LDL CALC: 55 mg/dL (ref 0–99)
Total CHOL/HDL Ratio: 2.8 Ratio
Triglycerides: 89 mg/dL (ref <150)
VLDL: 18 mg/dL (ref 0–40)

## 2014-08-09 LAB — GLUCOSE, RANDOM: GLUCOSE: 186 mg/dL — AB (ref 70–99)

## 2014-08-10 LAB — HEMOGLOBIN A1C
HEMOGLOBIN A1C: 6.1 % — AB (ref <5.7)
Mean Plasma Glucose: 128 mg/dL — ABNORMAL HIGH (ref <117)

## 2014-08-10 LAB — TESTOSTERONE, FREE, TOTAL, SHBG
SEX HORMONE BINDING: 20 nmol/L (ref 13–71)
TESTOSTERONE FREE: 62.7 pg/mL (ref 47.0–244.0)
TESTOSTERONE: 250 ng/dL — AB (ref 300–890)
Testosterone-% Free: 2.5 % (ref 1.6–2.9)

## 2014-08-12 ENCOUNTER — Other Ambulatory Visit: Payer: Self-pay | Admitting: *Deleted

## 2014-08-12 ENCOUNTER — Encounter: Payer: Self-pay | Admitting: Endocrinology

## 2014-08-12 ENCOUNTER — Ambulatory Visit (INDEPENDENT_AMBULATORY_CARE_PROVIDER_SITE_OTHER): Payer: BC Managed Care – PPO | Admitting: Endocrinology

## 2014-08-12 VITALS — BP 160/82 | HR 75 | Temp 98.6°F | Resp 16 | Ht 72.0 in | Wt 293.6 lb

## 2014-08-12 DIAGNOSIS — E039 Hypothyroidism, unspecified: Secondary | ICD-10-CM

## 2014-08-12 DIAGNOSIS — E291 Testicular hypofunction: Secondary | ICD-10-CM

## 2014-08-12 DIAGNOSIS — E785 Hyperlipidemia, unspecified: Secondary | ICD-10-CM

## 2014-08-12 DIAGNOSIS — E1165 Type 2 diabetes mellitus with hyperglycemia: Secondary | ICD-10-CM

## 2014-08-12 DIAGNOSIS — IMO0002 Reserved for concepts with insufficient information to code with codable children: Secondary | ICD-10-CM

## 2014-08-12 MED ORDER — "INSULIN SYRINGE 31G X 5/16"" 1 ML MISC": Status: DC

## 2014-08-12 MED ORDER — INSULIN REGULAR HUMAN 100 UNIT/ML IJ SOLN: INTRAMUSCULAR | Status: DC

## 2014-08-12 NOTE — Patient Instructions (Addendum)
LANTUS 26 UNITS and if am sugar > 140 after 4 days go to 30 units  U-500, 10 units before Bfst , 5 at lunch if eating and 12 at supper  Please check blood sugars at least half the time about 2 hours after any meal and times per week on waking up.  Please bring blood sugar monitor to each visit

## 2014-08-12 NOTE — Progress Notes (Signed)
Patient ID: Robert [name], male   DOB: 08-18-1961, 53 y.o.   MRN: [id number]   Reason for Appointment: Type II Diabetes follow-up   History of Present Illness   Diagnosis date:  2012  Previous history: His blood sugar has been difficult to control since onset. Initially was taking oral hypoglycemic drugs like glyburide but could not take metformin because of renal dysfunction. Since Victoza did not help his control he was started on Lantus and then U 500 insulin also His A1c has previously ranged from 7.8-9.8, the lowest level in 03/2013  Recent history: He has been on a regimen of basal insulin once a day in the morning and U-500 insulin before meals He was on Victoza but because of his markedly decreased appetite and weight loss this was stopped in 9/15 He is now on peritoneal dialysis since 10/15 Overall his blood sugars are higher and he thinks this is partly related to his recent peritonitis On his own he has increased his U-500 insulin to 10 units before breakfast and supper and previously was taking 5 units with each meal Has not increased his Lantus His appetite is still relatively small but has gained back some weight Current blood sugar patterns:  Fasting blood sugars are mildly increased  Blood sugars are checked only occasionally midday and are variable  Has occasional high readings in the afternoons and evenings and they are overall higher  No readings after her evening meal except once when the glucose was 240  No hypoglycemia  Checking blood sugar only 1.6 times a day and mostly before breakfast and supper  Does not take insulin at lunchtime usually and sometimes will skip lunch  He is again concerned about cost of his U-500 insulin       Side effects from medications: None Insulin regimen: LANTUS 22 units In a.m. now. U-500 insulin: 10 units twice a day,30 min ac;        Proper timing of medications in relation to meals: Yes.          Monitors blood glucose:  Once a day or less.    Glucometer: One Touch.          Blood Glucose readings from meter download:    PREMEAL Breakfast Lunch 4-6 PM 7:30 PM Overall  Glucose range: 122-220 110-188 106-323 240   median: 144  200  168          Meals: 3 meals per day. Dinner 7 p,m. Currently on the low phosphorus diet and has fewer carbohydrates        Physical activity: exercise: none           Weight control:  Wt Readings from Last 3 Encounters:  08/12/14 293 lb 9.6 oz (133.176 kg)  07/23/14 286 lb (129.729 kg)  07/09/14 286 lb 9.6 oz (130.001 kg)          Complications:   nephropathy, neuropathy   Diabetes labs:  Lab Results  Component Value Date   HGBA1C 6.1* 08/09/2014   HGBA1C 7.0* 03/31/2014   HGBA1C 9.4* 01/04/2014   Lab Results  Component Value Date   MICROALBUR 224.5 Repeated and verified X2.* 01/04/2014   LDLCALC 55 08/09/2014   CREATININE 14.23* 07/31/2014       Medication List       This list is accurate as of: 08/12/14 11:51 AM.  Always use your most recent med list.               aspirin  81 MG tablet  Take 81 mg by mouth every morning.     BYSTOLIC 10 MG tablet  Generic drug:  nebivolol  Take 15 mg by mouth every morning.     calcium carbonate 500 MG chewable tablet  Commonly known as:  TUMS - dosed in mg elemental calcium  Chew 4 tablets (800 mg of elemental calcium total) by mouth 3 (three) times daily with meals.     cetirizine 10 MG tablet  Commonly known as:  ZYRTEC  Take 10 mg by mouth every morning.     docusate sodium 100 MG capsule  Commonly known as:  COLACE  Take 100 mg by mouth every evening.     fluocinonide cream 0.05 %  Commonly known as:  LIDEX  Apply 1 application topically daily as needed (Skin inflammation (hand, thigh, back)).     furosemide 40 MG tablet  Commonly known as:  LASIX  Take 40 mg by mouth 2 (two) times daily.     gentamicin cream 0.1 %  Commonly known as:  GARAMYCIN  Apply 1 application topically once a week. Apply  weekly to peritoneal catheter     glucose blood test strip  Commonly known as:  ONETOUCH VERIO  Use as instructed to check blood sugar 6 a day dx code E11.29     hydrALAZINE 50 MG tablet  Commonly known as:  APRESOLINE  Take 100 mg by mouth 2 (two) times daily.     Insulin Glargine 100 UNIT/ML Solostar Pen  Commonly known as:  LANTUS SOLOSTAR  Inject 22 Units into the skin daily.     Insulin Pen Needle 32G X 4 MM Misc  Use 4 pen needles per day to inject insulin     NOVOFINE 32G X 6 MM Misc  Generic drug:  Insulin Pen Needle     insulin regular 100 units/mL injection  Commonly known as:  HUMULIN R  Inject 50 units at breakfast, 25 units at lunch and 50 units at supper     INSULIN SYRINGE 1CC/31GX5/16" 31G X 5/16" 1 ML Misc  Use to inject insulin     levothyroxine 100 MCG tablet  Commonly known as:  SYNTHROID, LEVOTHROID  Take 1 tablet (100 mcg total) by mouth daily.     OVER THE COUNTER MEDICATION  Renovite     polyethylene glycol packet  Commonly known as:  MIRALAX / GLYCOLAX  Take 17 g by mouth daily.     rosuvastatin 20 MG tablet  Commonly known as:  CRESTOR  Take 20 mg by mouth daily.     senna 8.6 MG tablet  Commonly known as:  SENOKOT  Take 1 tablet by mouth every evening.     tamsulosin 0.4 MG Caps capsule  Commonly known as:  FLOMAX  Take 1 capsule (0.4 mg total) by mouth daily.        Allergies: No Known Allergies  Past Medical History  Diagnosis Date  . Diabetes mellitus without complication   . Hypertension   . Thyroid disease   . Sleep apnea 06/18/11    split-night sleep study- [facility].  . Hyperlipidemia   . PAT (paroxysmal atrial tachycardia)   . PSVT (paroxysmal supraventricular tachycardia)   . Blood transfusion without reported diagnosis   . Chronic kidney disease     Stage IV  . CPAP (continuous positive airway pressure) dependence   . Dysrhythmia   . Shortness of breath   . Hypothyroidism   . GERD  (  gastroesophageal reflux disease)     n/v a lot  . Headache(784.0)     slight dull h/a due kidney failure  . Cancer     skin ca right shoulder  . Anemia     low hgb at present  . Umbilical hernia     will repair with thi surgery    Past Surgical History  Procedure Laterality Date  . Nephrectomy Left 1974  . Renal biopsy Right 2012  . Skin cancer excision    . Capd insertion N/A 05/18/2014    Procedure: LAPAROSCOPIC INSERTION CONTINUOUS AMBULATORY PERITONEAL DIALYSIS  (CAPD) CATHETER;  Surgeon: Ralene Ok, MD;  Location: [facility];  Service: General;  Laterality: N/A;  . Umbilical hernia repair N/A 05/18/2014    Procedure: HERNIA REPAIR UMBILICAL ADULT;  Surgeon: Ralene Ok, MD;  Location: [facility];  Service: General;  Laterality: N/A;  . Laparoscopic repositioning capd catheter N/A 06/16/2014    Procedure: LAPAROSCOPIC REPOSITIONING CAPD CATHETER;  Surgeon: Ralene Ok, MD;  Location: [facility];  Service: General;  Laterality: N/A;    Family History  Problem Relation Age of Onset  . Colon polyps Father   . Colon cancer Neg Hx   . Stomach cancer Neg Hx     Social History:  reports that he quit smoking about 8 months ago. His smoking use included Pipe. He has never used smokeless tobacco. He reports that he drinks about 0.6 oz of alcohol per week. He reports that he does not use illicit drugs.  Review of Systems:  Hypertension:  BP at home 130/80-90 in the mornings, later in the day 150/100-110. His blood pressure has been managed by his cardiologist mostly  Lipids: He has much better levels this year Was switched from Lipitor to Crestor because of relatively high LDL levels.     Lab Results  Component Value Date   CHOL 113 08/09/2014   CHOL 97 03/31/2014   CHOL 174 01/04/2014   Lab Results  Component Value Date   HDL 40 08/09/2014   HDL 32.60* 03/31/2014   HDL 38.80* 01/04/2014   Lab Results  Component Value Date   LDLCALC 55 08/09/2014   LDLCALC 20 03/31/2014    LDLCALC 119* 01/04/2014   Lab Results  Component Value Date   TRIG 89 08/09/2014   TRIG 221.0* 03/31/2014   TRIG 83.0 01/04/2014   Lab Results  Component Value Date   CHOLHDL 2.8 08/09/2014   CHOLHDL 3 03/31/2014   CHOLHDL 4 01/04/2014   No results found for: LDLDIRECT  He tends to get short of breath on exertion.  Thyroid: He has had long-standing hypothyroidism   TSH is normal. Does complain of feeling tired  but has multiple other problems including severe anemia  Lab Results  Component Value Date   TSH 4.190 08/09/2014   He is getting Epogen with dialysis now for anemia    HYPOGONADISM: 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 metabolic syndrome and had subjectively improved with supplementation using Fortesta He is off testosterone and free testosterone is normal in 11/15  Diabetic neuropathy: Has mild sensory loss   Examination:   BP 160/82 mmHg  Pulse 75  Temp(Src) 98.6 F (37 C)  Resp 16  Ht 6' (1.829 m)  Wt 293 lb 9.6 oz (133.176 kg)  BMI 39.81 kg/m2  SpO2 95%  Body mass index is 39.81 kg/(m^2).    ASSESSMENT/ PLAN:    Diabetes type 2:   His blood sugars appear overall higher  with starting peritoneal dialysis and also intercurrent infection Again he is not monitoring blood sugars enough and none after meals He is requiring larger doses of insulin as expected but still his blood sugars are relatively higher both fasting and in the evenings May also need more coverage for his evening meal but not clear what his postprandial readings are Currently A1c is falsely low at 6.1 because of his renal dysfunction and anemia Home blood sugars are averaging 175 recently  Recommended the following:  Need to titrate the Lantus to keep fasting blood sugar at least under 140  Increase the U-500 insulin at suppertime at least  He needs to take the regular insulin at lunchtime consistently if he is eating a meal  He may switch the U-500  to regular insulin and use 5 times the amount since he does not want to pay $100 for the U-500  Balanced meals  Start exercise when able to   HYPERLIPIDEMIA: His LDL is still at target and he can leave off his Zetia again  Patient Instructions  LANTUS 26 UNITS and if am sugar > 140 after 4 days go to 30 units  U-500, 10 units before Bfst , 5 at lunch if eating and 12 at supper  Please check blood sugars at least half the time about 2 hours after any meal and times per week on waking up.  Please bring blood sugar monitor to each visit    Hypothyroidism:  He can continue the same dose as TSH is normal  History of hypogonadism: No need to restart supplementation as his free testosterone is normal  Dexter Sauser 08/12/2014, 11:51 AM

## 2014-08-27 ENCOUNTER — Other Ambulatory Visit: Payer: Self-pay | Admitting: Endocrinology

## 2014-09-06 ENCOUNTER — Other Ambulatory Visit (HOSPITAL_COMMUNITY): Payer: Self-pay | Admitting: Internal Medicine

## 2014-09-06 DIAGNOSIS — Z7682 Awaiting organ transplant status: Secondary | ICD-10-CM

## 2014-09-10 ENCOUNTER — Encounter (HOSPITAL_COMMUNITY): Payer: BC Managed Care – PPO

## 2014-09-10 ENCOUNTER — Ambulatory Visit (HOSPITAL_COMMUNITY)
Admission: RE | Admit: 2014-09-10 | Discharge: 2014-09-10 | Disposition: A | Discharge status: Home / Self Care | Payer: BC Managed Care – PPO | Source: Ambulatory Visit | Attending: Internal Medicine | Admitting: Internal Medicine

## 2014-09-10 DIAGNOSIS — I10 Essential (primary) hypertension: Secondary | ICD-10-CM | POA: Insufficient documentation

## 2014-09-10 DIAGNOSIS — E785 Hyperlipidemia, unspecified: Secondary | ICD-10-CM | POA: Insufficient documentation

## 2014-09-10 DIAGNOSIS — R0609 Other forms of dyspnea: Secondary | ICD-10-CM | POA: Diagnosis not present

## 2014-09-10 DIAGNOSIS — Z794 Long term (current) use of insulin: Secondary | ICD-10-CM | POA: Insufficient documentation

## 2014-09-10 DIAGNOSIS — E669 Obesity, unspecified: Secondary | ICD-10-CM | POA: Diagnosis not present

## 2014-09-10 DIAGNOSIS — E119 Type 2 diabetes mellitus without complications: Secondary | ICD-10-CM | POA: Insufficient documentation

## 2014-09-10 DIAGNOSIS — Z7682 Awaiting organ transplant status: Secondary | ICD-10-CM

## 2014-09-10 DIAGNOSIS — Z87891 Personal history of nicotine dependence: Secondary | ICD-10-CM | POA: Insufficient documentation

## 2014-09-10 DIAGNOSIS — R42 Dizziness and giddiness: Secondary | ICD-10-CM | POA: Insufficient documentation

## 2014-09-10 MED ORDER — AMINOPHYLLINE 25 MG/ML IV SOLN
125.0000 mg | Freq: Once | INTRAVENOUS | Status: AC
  Administered 2014-09-10: 125 mg via INTRAVENOUS

## 2014-09-10 MED ORDER — TECHNETIUM TC 99M SESTAMIBI GENERIC - CARDIOLITE
30.9000 | Freq: Once | INTRAVENOUS | Status: AC | PRN
  Administered 2014-09-10: 30.9 via INTRAVENOUS

## 2014-09-10 MED ORDER — TECHNETIUM TC 99M SESTAMIBI GENERIC - CARDIOLITE
10.3000 | Freq: Once | INTRAVENOUS | Status: AC | PRN
  Administered 2014-09-10: 10 via INTRAVENOUS

## 2014-09-10 MED ORDER — REGADENOSON 0.4 MG/5ML IV SOLN
0.4000 mg | Freq: Once | INTRAVENOUS | Status: AC
  Administered 2014-09-10: 0.4 mg via INTRAVENOUS

## 2014-09-10 NOTE — Procedures (Addendum)
[facility] [address] [place] [id number]  Cardiology Nuclear Med Study  Robert [name] is a 53 y.o. male     MRN : [id number]     DOB: [birth year]  Procedure Date: 09/10/2014  Nuclear Med Background Indication for Stress Test:  Surgical Clearance and [facility] History:  PSVT;PAT;CKD;No prior NUC MPI for comparison; No prior respiratory history reported. Cardiac Risk Factors: History of Smoking, Hypertension, IDDM Type 2, Lipids and Obesity  Symptoms:  Dizziness, DOE, Fatigue, Light-Headedness and SOB   Nuclear Pre-Procedure Caffeine/Decaff Intake:  7:00pm NPO After: 5:00am   IV Site: R forearm  IV 0.9% NS with Angio Cath:  22g  Chest Size (in):  52"  IV Started by: Rolene Course, RN  Height: 6' (1.829 m)  Cup Size: n/a  BMI:  Body mass index is 39.73 kg/(m^2). Weight:  293 lb (132.904 kg)   Tech Comments:  n/a    Nuclear Med Study 1 or 2 day study: 1 day  Stress Test Type:  [place] Provider:  Shelva Majestic, MD   Resting Radionuclide: Technetium 33m Sestamibi  Resting Radionuclide Dose: 10.3 mCi   Stress Radionuclide:  Technetium 58m Sestamibi  Stress Radionuclide Dose: 30.9 mCi           Stress Protocol Rest HR: 69 Stress HR: 94  Rest BP: 102/49 Stress BP: 115/58  Exercise Time (min): n/a METS: n/a          Dose of Adenosine (mg):  n/a Dose of Lexiscan: 0.4 mg  Dose of Atropine (mg): n/a Dose of Dobutamine: n/a mcg/kg/min (at max HR)  Stress Test Technologist: Mellody Memos, CCT Nuclear Technologist: Imagene Riches, CNMT   Rest Procedure:  Myocardial perfusion imaging was performed at rest 45 minutes following the intravenous administration of Technetium 46m Sestamibi. Stress Procedure:  The patient received IV Lexiscan 0.4 mg over 15-seconds.  Technetium 30m Sestamibi injected Iv at 30-seconds.  Patient experienced a drop in blood pressure and was administered 125 mg  of Aminophylline IV. There were no significant changes with Lexiscan.  Quantitative spect images were obtained after a 45 minute delay.  Transient Ischemic Dilatation (Normal <1.22):  0.98 QGS EDV:  100 ml QGS ESV:  38 ml LV Ejection Fraction: 62%        Rest ECG: NSR - Normal EKG  Stress ECG: No significant change from baseline ECG  QPS Raw Data Images:  Normal; no motion artifact; normal heart/lung ratio. Stress Images:  Normal homogeneous uptake in all areas of the myocardium. Rest Images:  Normal homogeneous uptake in all areas of the myocardium. Subtraction (SDS):  No evidence of ischemia.  Impression Exercise Capacity:  Lexiscan with no exercise. BP Response:  Hypotensive blood pressure response. Clinical Symptoms:  No significant symptoms noted. ECG Impression:  No significant ST segment change suggestive of ischemia. Comparison with Prior Nuclear Study: No images to compare  Overall Impression:  Normal stress nuclear study.  LV Wall Motion:  NL LV Function; NL Wall Motion   Lorretta Harp, MD  09/10/2014 12:20 PM

## 2014-09-15 ENCOUNTER — Encounter (HOSPITAL_COMMUNITY): Payer: BC Managed Care – PPO

## 2014-09-20 ENCOUNTER — Other Ambulatory Visit (INDEPENDENT_AMBULATORY_CARE_PROVIDER_SITE_OTHER): Payer: BC Managed Care – PPO

## 2014-09-20 DIAGNOSIS — E039 Hypothyroidism, unspecified: Secondary | ICD-10-CM

## 2014-09-20 DIAGNOSIS — E1165 Type 2 diabetes mellitus with hyperglycemia: Secondary | ICD-10-CM

## 2014-09-20 DIAGNOSIS — E785 Hyperlipidemia, unspecified: Secondary | ICD-10-CM

## 2014-09-20 DIAGNOSIS — E291 Testicular hypofunction: Secondary | ICD-10-CM

## 2014-09-20 DIAGNOSIS — IMO0002 Reserved for concepts with insufficient information to code with codable children: Secondary | ICD-10-CM

## 2014-09-20 LAB — LIPID PANEL
CHOL/HDL RATIO: 2
Cholesterol: 87 mg/dL (ref 0–200)
HDL: 36.2 mg/dL — ABNORMAL LOW (ref >39.00)
LDL CALC: 34 mg/dL (ref 0–99)
NonHDL: 50.8
Triglycerides: 83 mg/dL (ref 0.0–149.0)
VLDL: 16.6 mg/dL (ref 0.0–40.0)

## 2014-09-20 LAB — GLUCOSE, RANDOM: GLUCOSE: 100 mg/dL — AB (ref 70–99)

## 2014-09-20 LAB — TSH: TSH: 3.29 u[IU]/mL (ref 0.35–4.50)

## 2014-09-20 LAB — TESTOSTERONE: Testosterone: 199.73 ng/dL — ABNORMAL LOW (ref 300.00–890.00)

## 2014-09-20 LAB — HEMOGLOBIN A1C: Hgb A1c MFr Bld: 6 % (ref 4.6–6.5)

## 2014-09-21 ENCOUNTER — Ambulatory Visit (INDEPENDENT_AMBULATORY_CARE_PROVIDER_SITE_OTHER): Payer: BC Managed Care – PPO | Admitting: Psychiatry

## 2014-09-21 DIAGNOSIS — F0630 Mood disorder due to known physiological condition, unspecified: Secondary | ICD-10-CM

## 2014-09-23 ENCOUNTER — Ambulatory Visit: Payer: BC Managed Care – PPO | Admitting: Endocrinology

## 2014-09-24 ENCOUNTER — Ambulatory Visit (INDEPENDENT_AMBULATORY_CARE_PROVIDER_SITE_OTHER): Payer: BC Managed Care – PPO | Admitting: Endocrinology

## 2014-09-24 ENCOUNTER — Encounter: Payer: Self-pay | Admitting: Endocrinology

## 2014-09-24 VITALS — BP 122/70 | HR 75 | Temp 98.2°F | Resp 14 | Ht 72.0 in | Wt 305.2 lb

## 2014-09-24 DIAGNOSIS — E1165 Type 2 diabetes mellitus with hyperglycemia: Secondary | ICD-10-CM

## 2014-09-24 DIAGNOSIS — IMO0002 Reserved for concepts with insufficient information to code with codable children: Secondary | ICD-10-CM

## 2014-09-24 DIAGNOSIS — E785 Hyperlipidemia, unspecified: Secondary | ICD-10-CM

## 2014-09-24 DIAGNOSIS — E039 Hypothyroidism, unspecified: Secondary | ICD-10-CM

## 2014-09-24 LAB — FRUCTOSAMINE: Fructosamine: 307 umol/L — ABNORMAL HIGH (ref 190–270)

## 2014-09-24 NOTE — Patient Instructions (Signed)
Crestor 1/2 daily  Humulin R: 13--13--15 take 30 min before the meal

## 2014-09-24 NOTE — Progress Notes (Signed)
Patient ID: Robert [name], male   DOB: 1961-09-22, 53 y.o.   MRN: [id number]   Reason for Appointment: Type II Diabetes follow-up   History of Present Illness   Diagnosis date:  2012  Previous history: His blood sugar has been difficult to control since onset. Initially was taking oral hypoglycemic drugs like glyburide but could not take metformin because of renal dysfunction. Since Victoza did not help his control he was started on Lantus and then U 500 insulin also His A1c has previously ranged from 7.8-9.8, the lowest level in 03/2013  Recent history: He has been on a regimen of basal insulin once a day in the morning and U-500 insulin before meals He was on Victoza but because of his markedly decreased appetite and weight loss this was stopped in 9/15 He is on peritoneal dialysis since 10/15 Although he wanted to try and a less expensive mealtime insulin he thinks U-500 is still less expensive overall Since his last visit he has been progressively increasing his insulin because of higher blood sugars and all his doses are higher than before.Has  increased his Lantus from 26 up to 30 units since 11/15 Also taking at least 17 units more per day of the U-500 insulin since his last visit and also taking lunchtime dose more consistently.  This has not caused any hypoglycemia. A1c is lower than expected for his blood sugars probably related to his anemia and renal insufficiency His appetite is still not large but has stabilized his weight Current blood sugar patterns:  Fasting blood sugars are mildly increased and are overall the highest of the day  Blood sugars are quite variable both around breakfast and suppertime; however more recently they have been relatively lower in the last week.  Blood sugars are checked only occasionally midday and are fairly good   Blood sugars before supper time are better than on his last visit  Has only one recent reading after dinner which was 142 last  night   Side effects from medications: None Insulin regimen: LANTUS 30 units In a.m. now. U-500 insulin: 15 units 3x a day,30 min ac;        Proper timing of medications in relation to meals: Yes.          Monitors blood glucose: Once a day or less.    Glucometer: One Touch.          Blood Glucose readings from meter download:    PRE-MEAL Breakfast Lunch Dinner Bedtime Overall  Glucose range:  78-199   102-145   71-201    71-207   Median:  145    116    127            Meals: 3 meals per day. Dinner 7 p,m. Currently on the low phosphorus diet and has fewer carbohydrates        Physical activity: exercise: none           Weight control:  Wt Readings from Last 3 Encounters:  09/24/14 305 lb 3.2 oz (138.438 kg)  09/10/14 293 lb (132.904 kg)  08/12/14 293 lb 9.6 oz (133.176 kg)          Complications:   nephropathy, neuropathy   Diabetes labs:  Lab Results  Component Value Date   HGBA1C 6.0 09/20/2014   HGBA1C 6.1* 08/09/2014   HGBA1C 7.0* 03/31/2014   Lab Results  Component Value Date   MICROALBUR 224.5 Repeated and verified X2.* 01/04/2014   LDLCALC 34 09/20/2014  CREATININE 14.23* 07/31/2014       Medication List       This list is accurate as of: 09/24/14 11:59 PM.  Always use your most recent med list.               aspirin 81 MG tablet  Take 81 mg by mouth every morning.     BYSTOLIC 10 MG tablet  Generic drug:  nebivolol  Take 15 mg by mouth every morning.     TUMS E-X SUGAR FREE PO  Take by mouth.     calcium carbonate 500 MG chewable tablet  Commonly known as:  TUMS - dosed in mg elemental calcium  Chew 4 tablets (800 mg of elemental calcium total) by mouth 3 (three) times daily with meals.     cetirizine 10 MG tablet  Commonly known as:  ZYRTEC  Take 10 mg by mouth every morning.     docusate sodium 100 MG capsule  Commonly known as:  COLACE  Take 100 mg by mouth every evening.     fluocinonide cream 0.05 %  Commonly known as:  LIDEX   Apply 1 application topically daily as needed (Skin inflammation (hand, thigh, back)).     furosemide 40 MG tablet  Commonly known as:  LASIX  Take 40 mg by mouth 2 (two) times daily.     gentamicin cream 0.1 %  Commonly known as:  GARAMYCIN  Apply 1 application topically once a week. Apply weekly to peritoneal catheter     glucose blood test strip  Commonly known as:  ONETOUCH VERIO  Use as instructed to check blood sugar 6 a day dx code E11.29     hydrALAZINE 50 MG tablet  Commonly known as:  APRESOLINE  Take 100 mg by mouth 2 (two) times daily.     Insulin Glargine 100 UNIT/ML Solostar Pen  Commonly known as:  LANTUS SOLOSTAR  Inject 22 Units into the skin daily.     insulin regular 100 units/mL injection  Commonly known as:  HUMULIN R  Inject 50 units at breakfast, 25 units at lunch and 50 units at supper     INSULIN SYRINGE 1CC/31GX5/16" 31G X 5/16" 1 ML Misc  Use to inject insulin     levothyroxine 100 MCG tablet  Commonly known as:  SYNTHROID, LEVOTHROID  Take 1 tablet (100 mcg total) by mouth daily.     NOVOFINE 32G X 6 MM Misc  Generic drug:  Insulin Pen Needle     NOVOFINE 32G X 6 MM Misc  Generic drug:  Insulin Pen Needle  USE 4 PEN NEEDLES PER DAY TO INJECT INSULIN     OVER THE COUNTER MEDICATION  Renovite     polyethylene glycol packet  Commonly known as:  MIRALAX / GLYCOLAX  Take 17 g by mouth daily.     rosuvastatin 20 MG tablet  Commonly known as:  CRESTOR  Take 20 mg by mouth daily.     senna 8.6 MG tablet  Commonly known as:  SENOKOT  Take 1 tablet by mouth every evening.     tamsulosin 0.4 MG Caps capsule  Commonly known as:  FLOMAX  Take 1 capsule (0.4 mg total) by mouth daily.        Allergies: No Known Allergies  Past Medical History  Diagnosis Date  . Diabetes mellitus without complication   . Hypertension   . Thyroid disease   . [facility]  .  Hyperlipidemia   . PAT (paroxysmal atrial tachycardia)   . PSVT (paroxysmal supraventricular tachycardia)   . Blood transfusion without reported diagnosis   . Chronic kidney disease     Stage IV  . CPAP (continuous positive airway pressure) dependence   . Dysrhythmia   . Shortness of breath   . Hypothyroidism   . GERD (gastroesophageal reflux disease)     n/v a lot  . Headache(784.0)     slight dull h/a due kidney failure  . Cancer     skin ca right shoulder  . Anemia     low hgb at present  . Umbilical hernia     will repair with thi surgery    Past Surgical History  Procedure Laterality Date  . Nephrectomy Left 1974  . Renal biopsy Right 2012  . Skin cancer excision    . Capd insertion N/A 05/18/2014    Procedure: LAPAROSCOPIC INSERTION CONTINUOUS AMBULATORY PERITONEAL DIALYSIS  (CAPD) CATHETER;  Surgeon: Ralene Ok, MD;  Location: [place];  Service: General;  Laterality: N/A;  . Umbilical hernia repair N/A 05/18/2014    Procedure: HERNIA REPAIR UMBILICAL ADULT;  Surgeon: Ralene Ok, MD;  Location: [place];  Service: General;  Laterality: N/A;  . Laparoscopic repositioning capd catheter N/A 06/16/2014    Procedure: LAPAROSCOPIC REPOSITIONING CAPD CATHETER;  Surgeon: Ralene Ok, MD;  Location: [place];  Service: General;  Laterality: N/A;    Family History  Problem Relation Age of Onset  . Colon polyps Father   . Colon cancer Neg Hx   . Stomach cancer Neg Hx     Social History:  reports that he quit smoking about 9 months ago. His smoking use included Pipe. He has never used smokeless tobacco. He reports that he drinks about 0.6 oz of alcohol per week. He reports that he does not use illicit drugs.  Review of Systems:  Hypertension:  BP is now better controlled  His blood pressure has been managed by his cardiologist mostly  Lipids: He has much better levels this year Was switched from Lipitor to Crestor because of relatively high LDL levels but LDL has been  trending lower.     Lab Results  Component Value Date   CHOL 87 09/20/2014   CHOL 113 08/09/2014   CHOL 97 03/31/2014   Lab Results  Component Value Date   HDL 36.20* 09/20/2014   HDL 40 08/09/2014   HDL 32.60* 03/31/2014   Lab Results  Component Value Date   LDLCALC 34 09/20/2014   LDLCALC 55 08/09/2014   LDLCALC 20 03/31/2014   Lab Results  Component Value Date   TRIG 83.0 09/20/2014   TRIG 89 08/09/2014   TRIG 221.0* 03/31/2014   Lab Results  Component Value Date   CHOLHDL 2 09/20/2014   CHOLHDL 2.8 08/09/2014   CHOLHDL 3 03/31/2014   No results found for: LDLDIRECT   Thyroid: He has had long-standing hypothyroidism   TSH is normal. Does complain of feeling tired     Lab Results  Component Value Date   TSH 3.29 09/20/2014   He is getting Epogen with dialysis now for anemia  He is considering  renal transplant    HYPOGONADISM: 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 metabolic syndrome and had subjectively improved with supplementation using [place] He is off testosterone and free testosterone is normal in 11/15 No complaints of libido changes  Diabetic neuropathy: Has mild sensory loss  Examination:   BP 122/70 mmHg  Pulse 75  Temp(Src) 98.2 F (36.8 C)  Resp 14  Ht 6' (1.829 m)  Wt 305 lb 3.2 oz (138.438 kg)  BMI 41.38 kg/m2  SpO2 94%  Body mass index is 41.38 kg/(m^2).    ASSESSMENT/ PLAN:    Diabetes type 2:   His blood sugars have improved significantly since his last visit with increasing his insulin He is taking a large amount of insulin now considering that he is taking 45 units a day of U-500 insulin However blood sugars are fairly good with some variability and still relatively higher fasting possibly because of his using his peritoneal fluid to dwell overnight Have discussed the need for more postprandial readings Currently A1c is falsely low at 6.0 because of his renal dysfunction and anemia Home blood  sugars are averaging 134 recently compared to 175 previously  Recommended the following:  No change in his Lantus unless fasting blood sugars start getting below 100.  Needs to check fasting readings at least every other day  Decrease U-500 insulin at breakfast and lunch to avoid hypoglycemia later in the day  Adjust dose of suppertime U-500 based on bedtime readings which should be at least under 180  Balanced meals  Start exercise when able to   HYPERLIPIDEMIA: His LDL is still well below target and he can reduce Crestor to 10 mg, discussed that he may be needing lower doses because of renal dysfunction. He should get adequate cardiovascular protection with 10 mg Crestor, to keep LDL below 70 Discussed No known risks of having very low LDL  Hypothyroidism:  He can continue the same dose as TSH is normal  History of hypogonadism:  His symptoms are rather nonspecific for fatigue and probably related to his renal failure. He does have relatively low total testosterone but previously the free testosterone has been normal Will recheck free testosterone on the next visit  Patient Instructions  Crestor 1/2 daily  Humulin R: 13--13--15 take 30 min before the meal     Counseling time over 50% of today's 25 minute visit    Korin Hartwell 09/26/2014, 3:32 PM

## 2014-09-28 ENCOUNTER — Ambulatory Visit (INDEPENDENT_AMBULATORY_CARE_PROVIDER_SITE_OTHER): Payer: BC Managed Care – PPO | Admitting: Psychiatry

## 2014-09-28 DIAGNOSIS — F0630 Mood disorder due to known physiological condition, unspecified: Secondary | ICD-10-CM

## 2014-10-04 ENCOUNTER — Other Ambulatory Visit: Payer: Self-pay | Admitting: Endocrinology

## 2014-11-04 ENCOUNTER — Ambulatory Visit (INDEPENDENT_AMBULATORY_CARE_PROVIDER_SITE_OTHER): Payer: BLUE CROSS/BLUE SHIELD | Admitting: Psychiatry

## 2014-11-04 DIAGNOSIS — F060 Psychotic disorder with hallucinations due to known physiological condition: Secondary | ICD-10-CM

## 2014-11-16 ENCOUNTER — Ambulatory Visit (INDEPENDENT_AMBULATORY_CARE_PROVIDER_SITE_OTHER): Payer: BLUE CROSS/BLUE SHIELD | Admitting: Psychiatry

## 2014-11-16 DIAGNOSIS — F0630 Mood disorder due to known physiological condition, unspecified: Secondary | ICD-10-CM

## 2014-11-22 ENCOUNTER — Other Ambulatory Visit: Payer: BC Managed Care – PPO

## 2014-11-23 ENCOUNTER — Ambulatory Visit (INDEPENDENT_AMBULATORY_CARE_PROVIDER_SITE_OTHER): Payer: BLUE CROSS/BLUE SHIELD | Admitting: Psychiatry

## 2014-11-23 DIAGNOSIS — F0630 Mood disorder due to known physiological condition, unspecified: Secondary | ICD-10-CM

## 2014-11-24 ENCOUNTER — Other Ambulatory Visit (INDEPENDENT_AMBULATORY_CARE_PROVIDER_SITE_OTHER): Payer: BLUE CROSS/BLUE SHIELD

## 2014-11-24 DIAGNOSIS — E1165 Type 2 diabetes mellitus with hyperglycemia: Secondary | ICD-10-CM | POA: Diagnosis not present

## 2014-11-24 DIAGNOSIS — IMO0002 Reserved for concepts with insufficient information to code with codable children: Secondary | ICD-10-CM

## 2014-11-24 LAB — GLUCOSE, RANDOM: Glucose, Bld: 138 mg/dL — ABNORMAL HIGH (ref 70–99)

## 2014-11-25 ENCOUNTER — Ambulatory Visit: Payer: BC Managed Care – PPO | Admitting: Endocrinology

## 2014-11-26 ENCOUNTER — Telehealth: Payer: Self-pay | Admitting: Endocrinology

## 2014-11-26 LAB — FRUCTOSAMINE: Fructosamine: 338 umol/L — ABNORMAL HIGH (ref 190–270)

## 2014-11-26 NOTE — Telephone Encounter (Signed)
Patient would like for you to call about his meds

## 2014-11-29 ENCOUNTER — Ambulatory Visit (INDEPENDENT_AMBULATORY_CARE_PROVIDER_SITE_OTHER): Payer: BLUE CROSS/BLUE SHIELD | Admitting: Endocrinology

## 2014-11-29 ENCOUNTER — Encounter: Payer: Self-pay | Admitting: Endocrinology

## 2014-11-29 VITALS — BP 128/76 | HR 77 | Temp 98.0°F | Resp 14 | Ht 72.0 in | Wt 305.8 lb

## 2014-11-29 DIAGNOSIS — E1165 Type 2 diabetes mellitus with hyperglycemia: Secondary | ICD-10-CM

## 2014-11-29 DIAGNOSIS — IMO0002 Reserved for concepts with insufficient information to code with codable children: Secondary | ICD-10-CM

## 2014-11-29 DIAGNOSIS — E785 Hyperlipidemia, unspecified: Secondary | ICD-10-CM

## 2014-11-29 DIAGNOSIS — E291 Testicular hypofunction: Secondary | ICD-10-CM | POA: Diagnosis not present

## 2014-11-29 NOTE — Patient Instructions (Addendum)
U-500 insulin 18-20 units right at supper and drop night dose 10-12 units  Reduce Lantus if am sugar < 90  Please check blood sugars at least half the time about 2 hours after any meal and 3 times per week on waking up.  Please bring blood sugar monitor to each visit. Recommended blood sugar levels about 2 hours after meal is 130-160 and on waking up 90-120

## 2014-11-29 NOTE — Progress Notes (Signed)
Patient ID: Robert [name], male   DOB: [birth year], 54 y.o.   MRN: [id number]   Reason for Appointment: Type II Diabetes follow-up   History of Present Illness   Diagnosis date:  2012  Previous history: His blood sugar has been difficult to control since onset. Initially was taking oral hypoglycemic drugs like glyburide but could not take metformin because of renal dysfunction. Since Victoza did not help his control he was started on Lantus and then U 500 insulin also His A1c has previously ranged from 7.8-9.8, the lowest level in 03/2013 He was on Victoza but because of his markedly decreased appetite and weight loss this was stopped in 9/15  Recent history:  He has been on a regimen of basal insulin once a day in the morning and U-500 insulin before meals He is on peritoneal dialysis since 10/15 Since his last visit he has added another injection of U-500 insulin during the night on his own.  He did this because his sugars were higher with starting his overnight dialysis bag.  With this his fasting blood sugars are relatively better also. Surprisingly his blood sugars do not appear to be higher after his meals even though he did not bring his monitor for detailed review. Also not having any hypoglycemia with taking additional 15 units of U-500 insulin during the night. Continues to take it the same amount of Lantus 30 units; he did not take Toujeo as he is not sure about his coverage from insurance  His A1c is again lower than expected for his blood sugars Fructosamine is moderately increased at 334  been progressively increasing his insulin because of higher blood sugars and all his doses are higher than before.Has  increased his Lantus from 26 up to 30 units since 11/15 Also taking at least 17 units more per day of the U-500 insulin since his last visit and also taking lunchtime dose more consistently.  This has not caused any hypoglycemia. A1c is lower than expected for his blood  sugars probably related to his anemia and renal insufficiency His appetite is still not large but has stabilized his weight Current blood sugar patterns:  Fasting blood sugars are excellent around 90-100  Post prandial readings are usually about 150 or less but not clear if he is checking these often, checking mostly after supper.  No low blood sugars reported  Blood sugar may be about 160 at 3 am even though his sugars are not high after supper   Insulin regimen: LANTUS 30 units In a.m. now. U-500 insulin: 15 units 4 x a day, 30 min ac and at 2-3 AM        Proper timing of medications in relation to meals: Yes.          Monitors blood glucose: Once a day or less.    Glucometer: One Touch.          Blood Glucose readings as above         Meals: 3 meals per day. Dinner 7 p,m. Currently on the low phosphorus diet and has fewer carbohydrates        Physical activity: exercise: none           Weight control:  Wt Readings from Last 3 Encounters:  11/29/14 305 lb 12.8 oz (138.71 kg)  09/24/14 305 lb 3.2 oz (138.438 kg)  09/10/14 293 lb (132.904 kg)          Complications:   nephropathy, neuropathy   Diabetes labs:  Lab Results  Component Value Date   HGBA1C 6.0 09/20/2014   HGBA1C 6.1* 08/09/2014   HGBA1C 7.0* 03/31/2014   Lab Results  Component Value Date   MICROALBUR 224.5 Repeated and verified X2.* 01/04/2014   LDLCALC 34 09/20/2014   CREATININE 14.23* 07/31/2014       Medication List       This list is accurate as of: 11/29/14 11:59 PM.  Always use your most recent med list.               aspirin 81 MG tablet  Take 81 mg by mouth every morning.     BYSTOLIC 10 MG tablet  Generic drug:  nebivolol  Take 15 mg by mouth every morning.     BYSTOLIC 5 MG tablet  Generic drug:  nebivolol  Take 5 mg by mouth daily.     TUMS E-X SUGAR FREE PO  Take by mouth.     calcium carbonate 500 MG chewable tablet  Commonly known as:  TUMS - dosed in mg elemental  calcium  Chew 4 tablets (800 mg of elemental calcium total) by mouth 3 (three) times daily with meals.     cetirizine 10 MG tablet  Commonly known as:  ZYRTEC  Take 10 mg by mouth every morning.     CRESTOR 20 MG tablet  Generic drug:  rosuvastatin  TAKE 1 TABLET (20 MG TOTAL) BY MOUTH DAILY.     docusate sodium 100 MG capsule  Commonly known as:  COLACE  Take 100 mg by mouth every evening.     fluocinonide cream 0.05 %  Commonly known as:  LIDEX  Apply 1 application topically daily as needed (Skin inflammation (hand, thigh, back)).     furosemide 40 MG tablet  Commonly known as:  LASIX  Take 40 mg by mouth 2 (two) times daily.     gentamicin cream 0.1 %  Commonly known as:  GARAMYCIN  Apply 1 application topically once a week. Apply weekly to peritoneal catheter     glucose blood test strip  Commonly known as:  ONETOUCH VERIO  Use as instructed to check blood sugar 6 a day dx code E11.29     HUMULIN R 500 UNIT/ML Soln injection  Generic drug:  insulin regular human CONCENTRATED  3 (three) times daily with meals. 13-13-15     hydrALAZINE 50 MG tablet  Commonly known as:  APRESOLINE  Take 100 mg by mouth 2 (two) times daily.     Insulin Glargine 100 UNIT/ML Solostar Pen  Commonly known as:  LANTUS SOLOSTAR  Inject 22 Units into the skin daily.     INSULIN SYRINGE 1CC/31GX5/16" 31G X 5/16" 1 ML Misc  Use to inject insulin     levothyroxine 100 MCG tablet  Commonly known as:  SYNTHROID, LEVOTHROID  TAKE 1 TABLET (100 MCG TOTAL) BY MOUTH DAILY.     NOVOFINE 32G X 6 MM Misc  Generic drug:  Insulin Pen Needle     NOVOFINE 32G X 6 MM Misc  Generic drug:  Insulin Pen Needle  USE 4 PEN NEEDLES PER DAY TO INJECT INSULIN     OVER THE COUNTER MEDICATION  Renovite     polyethylene glycol packet  Commonly known as:  MIRALAX / GLYCOLAX  Take 17 g by mouth daily.     senna 8.6 MG tablet  Commonly known as:  SENOKOT  Take 1 tablet by mouth every evening.      tamsulosin 0.4 MG Caps  capsule  Commonly known as:  FLOMAX  Take 1 capsule (0.4 mg total) by mouth daily.        Allergies: No Known Allergies  Past Medical History  Diagnosis Date  . Diabetes mellitus without complication   . Hypertension   . Thyroid disease   . Sleep apnea 06/18/11    split-night sleep [facility]- [facility].  . Hyperlipidemia   . PAT (paroxysmal atrial tachycardia)   . PSVT (paroxysmal supraventricular tachycardia)   . Blood transfusion without reported diagnosis   . Chronic kidney disease     Stage IV  . CPAP (continuous positive airway pressure) dependence   . Dysrhythmia   . Shortness of breath   . Hypothyroidism   . GERD (gastroesophageal reflux disease)     n/v a lot  . Headache(784.0)     slight dull h/a due kidney failure  . Cancer     skin ca right shoulder  . Anemia     low hgb at present  . Umbilical hernia     will repair with thi surgery    Past Surgical History  Procedure Laterality Date  . Nephrectomy Left 1974  . Renal biopsy Right 2012  . Skin cancer excision    . Capd insertion N/A 05/18/2014    Procedure: LAPAROSCOPIC INSERTION CONTINUOUS AMBULATORY PERITONEAL DIALYSIS  (CAPD) CATHETER;  Surgeon: Ralene Ok, MD;  Location: [place];  Service: General;  Laterality: N/A;  . Umbilical hernia repair N/A 05/18/2014    Procedure: HERNIA REPAIR UMBILICAL ADULT;  Surgeon: Ralene Ok, MD;  Location: [place];  Service: General;  Laterality: N/A;  . Laparoscopic repositioning capd catheter N/A 06/16/2014    Procedure: LAPAROSCOPIC REPOSITIONING CAPD CATHETER;  Surgeon: Ralene Ok, MD;  Location: [place];  Service: General;  Laterality: N/A;    Family History  Problem Relation Age of Onset  . Colon polyps Father   . Colon cancer Neg Hx   . Stomach cancer Neg Hx     Social History:  reports that he quit smoking about a year ago. His smoking use included Pipe. He has never used smokeless tobacco. He reports that  he drinks about 0.6 oz of alcohol per week. He reports that he does not use illicit drugs.  Review of Systems:  Hypertension:  BP is now controlled  His blood pressure has been managed by his cardiologist mostly  Lipids: He has much better levels this year Was switched from Lipitor to Crestor because of relatively high LDL levels but LDL has been trending lower.  Now taking Crestor qod    Lab Results  Component Value Date   CHOL 87 09/20/2014   CHOL 113 08/09/2014   CHOL 97 03/31/2014   Lab Results  Component Value Date   HDL 36.20* 09/20/2014   HDL 40 08/09/2014   HDL 32.60* 03/31/2014   Lab Results  Component Value Date   LDLCALC 34 09/20/2014   LDLCALC 55 08/09/2014   LDLCALC 20 03/31/2014   Lab Results  Component Value Date   TRIG 83.0 09/20/2014   TRIG 89 08/09/2014   TRIG 221.0* 03/31/2014   Lab Results  Component Value Date   CHOLHDL 2 09/20/2014   CHOLHDL 2.8 08/09/2014   CHOLHDL 3 03/31/2014   No results found for: LDLDIRECT   Thyroid: He has had long-standing hypothyroidism   TSH is normal. 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    ASSESSMENT/ PLAN:    Diabetes type 2:   His blood sugars have improved as reported by his home  readings but his fructosamine is still relatively high He is taking a large amount of insulin now considering that he is taking 60 units a day of U-500 insulin along with 30 units of Lantus He is requiring more insulin with using peritoneal dialysis containing dextrose Also starting to gain weight or deformity from his appetite improving; does need to start exercise  A1c is falsely low at 6.0 because of his renal dysfunction and anemia  Since his blood sugars are relatively higher late at night he can increase his dose of U-500 insulin before supper and reduce nighttime dose to avoid early morning hypoglycemia.  May take his suppertime dose after starting his food Also may need to reduce Lantus Again discussed timing of glucose monitoring and blood sugar targets and he will bring his monitor for review on the next visit  History of hypogonadism:  His symptoms are rather nonspecific for fatigue  He does have relatively low total testosterone but previously the free testosterone has been normal Will recheck free testosterone on the next visit especially since he is gaining weight again  Patient Instructions  U-500 insulin 18-20 units right at supper and drop night dose 10-12 units  Reduce Lantus if am sugar < 90  Please check blood sugars at least half the time about 2 hours after any meal and 3 times per week on waking up.  Please bring blood sugar monitor to each visit. Recommended blood sugar levels about 2 hours after meal is 130-160 and on waking up 90-120     Counseling time over 50% of today's 25 minute visit   Conny Moening 11/30/2014, 2:05 PM

## 2014-12-14 ENCOUNTER — Ambulatory Visit (INDEPENDENT_AMBULATORY_CARE_PROVIDER_SITE_OTHER): Payer: BLUE CROSS/BLUE SHIELD | Admitting: Psychiatry

## 2014-12-14 ENCOUNTER — Ambulatory Visit: Payer: Medicare Other | Admitting: Psychology

## 2014-12-14 DIAGNOSIS — F0630 Mood disorder due to known physiological condition, unspecified: Secondary | ICD-10-CM

## 2014-12-21 ENCOUNTER — Ambulatory Visit (INDEPENDENT_AMBULATORY_CARE_PROVIDER_SITE_OTHER): Payer: BLUE CROSS/BLUE SHIELD | Admitting: Psychiatry

## 2014-12-21 DIAGNOSIS — F0630 Mood disorder due to known physiological condition, unspecified: Secondary | ICD-10-CM | POA: Diagnosis not present

## 2014-12-29 ENCOUNTER — Ambulatory Visit (INDEPENDENT_AMBULATORY_CARE_PROVIDER_SITE_OTHER): Payer: BLUE CROSS/BLUE SHIELD | Admitting: Psychiatry

## 2014-12-29 DIAGNOSIS — F0630 Mood disorder due to known physiological condition, unspecified: Secondary | ICD-10-CM

## 2014-12-29 LAB — HM DIABETES EYE EXAM

## 2015-01-05 ENCOUNTER — Ambulatory Visit: Payer: BLUE CROSS/BLUE SHIELD | Admitting: Psychiatry

## 2015-01-11 ENCOUNTER — Ambulatory Visit (INDEPENDENT_AMBULATORY_CARE_PROVIDER_SITE_OTHER): Payer: BLUE CROSS/BLUE SHIELD | Admitting: Psychiatry

## 2015-01-11 DIAGNOSIS — F0630 Mood disorder due to known physiological condition, unspecified: Secondary | ICD-10-CM

## 2015-01-19 ENCOUNTER — Encounter: Payer: Self-pay | Admitting: Podiatry

## 2015-01-19 ENCOUNTER — Ambulatory Visit (INDEPENDENT_AMBULATORY_CARE_PROVIDER_SITE_OTHER): Payer: Medicare Other | Admitting: Podiatry

## 2015-01-19 VITALS — BP 94/57 | HR 77 | Resp 18

## 2015-01-19 DIAGNOSIS — M79676 Pain in unspecified toe(s): Secondary | ICD-10-CM | POA: Diagnosis not present

## 2015-01-19 DIAGNOSIS — E1140 Type 2 diabetes mellitus with diabetic neuropathy, unspecified: Secondary | ICD-10-CM | POA: Diagnosis not present

## 2015-01-19 DIAGNOSIS — E1149 Type 2 diabetes mellitus with other diabetic neurological complication: Secondary | ICD-10-CM

## 2015-01-19 DIAGNOSIS — B351 Tinea unguium: Secondary | ICD-10-CM

## 2015-01-19 NOTE — Patient Instructions (Signed)
Over the counter medication for nail fungus is called fungi-nail  Diabetic Neuropathy Diabetic neuropathy is a nerve disease or nerve damage that is caused by diabetes mellitus. About half of all people with diabetes mellitus have some form of nerve damage. Nerve damage is more common in those who have had diabetes mellitus for many years and who generally have not had good control of their blood sugar (glucose) level. Diabetic neuropathy is a common complication of diabetes mellitus. There are three more common types of diabetic neuropathy and a fourth type that is less common and less understood:   Peripheral neuropathy--This is the most common type of diabetic neuropathy. It causes damage to the nerves of the feet and legs first and then eventually the hands and arms.The damage affects the ability to sense touch.  Autonomic neuropathy--This type causes damage to the autonomic nervous system, which controls the following functions:  Heartbeat.  Body temperature.  Blood pressure.  Urination.  Digestion.  Sweating.  Sexual function.  Focal neuropathy--Focal neuropathy can be painful and unpredictable and occurs most often in older adults with diabetes mellitus. It involves a specific nerve or one area and often comes on suddenly. It usually does not cause long-term problems.  Radiculoplexus neuropathy-- Sometimes called lumbosacral radiculoplexus neuropathy, radiculoplexus neuropathy affects the nerves of the thighs, hips, buttocks, or legs. It is more common in people with type 2 diabetes mellitus and in older men. It is characterized by debilitating pain, weakness, and atrophy, usually in the thigh muscles. CAUSES  The cause of peripheral, autonomic, and focal neuropathies is diabetes mellitus that is uncontrolled and high glucose levels. The cause of radiculoplexus neuropathy is unknown. However, it is thought to be caused by inflammation related to uncontrolled glucose levels. SIGNS  AND SYMPTOMS  Peripheral Neuropathy Peripheral neuropathy develops slowly over time. When the nerves of the feet and legs no longer work there may be:   Burning, stabbing, or aching pain in the legs or feet.  Inability to feel pressure or pain in your feet. This can lead to:  Thick calluses over pressure areas.  Pressure sores.  Ulcers.  Foot deformities.  Reduced ability to feel temperature changes.  Muscle weakness. Autonomic Neuropathy The symptoms of autonomic neuropathy vary depending on which nerves are affected. Symptoms may include:  Problems with digestion, such as:  Feeling sick to your stomach (nausea).  Vomiting.  Bloating.  Constipation.  Diarrhea.  Abdominal pain.  Difficulty with urination. This occurs if you lose your ability to sense when your bladder is full. Problems include:  Urine leakage (incontinence).  Inability to empty your bladder completely (retention).  Rapid or irregular heartbeat (palpitations).  Blood pressure drops when you stand up (orthostatic hypotension). When you stand up you may feel:  Dizzy.  Weak.  Faint.  In men, inability to attain and maintain an erection.  In women, vaginal dryness and problems with decreased sexual desire and arousal.  Problems with body temperature regulation.  Increased or decreased sweating. Focal Neuropathy  Abnormal eye movements or abnormal alignment of both eyes.  Weakness in the wrist.  Foot drop. This results in an inability to lift the foot properly and abnormal walking or foot movement.  Paralysis on one side of your face (Bell palsy).  Chest or abdominal pain. Radiculoplexus Neuropathy  Sudden, severe pain in your hip, thigh, or buttocks.  Weakness and wasting of thigh muscles.  Difficulty rising from a seated position.  Abdominal swelling.  Unexplained weight loss (usually more than  10 lb [4.5 kg]). DIAGNOSIS  Peripheral Neuropathy Your senses may be tested.  Sensory function testing can be done with:  A light touch using a monofilament.  A vibration with tuning fork.  A sharp sensation with a pin prick. Other tests that can help diagnose neuropathy are:  Nerve conduction velocity. This test checks the transmission of an electrical current through a nerve.  Electromyography. This shows how muscles respond to electrical signals transmitted by nearby nerves.  Quantitative sensory testing. This is used to assess how your nerves respond to vibrations and changes in temperature. Autonomic Neuropathy Diagnosis is often based on reported symptoms. Tell your health care provider if you experience:   Dizziness.   Constipation.   Diarrhea.   Inappropriate urination or inability to urinate.   Inability to get or maintain an erection.  Tests that may be done include:   Electrocardiography or Holter monitor. These are tests that can help show problems with the heart rate or heart rhythm.   An X-ray exam may be done. Focal Neuropathy Diagnosis is made based on your symptoms and what your health care provider finds during your exam. Other tests may be done. They may include:  Nerve conduction velocities. This checks the transmission of electrical current through a nerve.  Electromyography. This shows how muscles respond to electrical signals transmitted by nearby nerves.  Quantitative sensory testing. This test is used to assess how your nerves respond to vibration and changes in temperature. Radiculoplexus Neuropathy  Often the first thing is to eliminate any other issue or problems that might be the cause, as there is no stick test for diagnosis.  X-ray exam of your spine and lumbar region.  Spinal tap to rule out cancer.  MRI to rule out other lesions. TREATMENT  Once nerve damage occurs, it cannot be reversed. The goal of treatment is to keep the disease or nerve damage from getting worse and affecting more nerve fibers.  Controlling your blood glucose level is the key. Most people with radiculoplexus neuropathy see at least a partial improvement over time. You will need to keep your blood glucose and HbA1c levels in the target range determined by your health care provider. Things that help control blood glucose levels include:   Blood glucose monitoring.   Meal planning.   Physical activity.   Diabetes medicine.  Over time, maintaining lower blood glucose levels helps lessen symptoms. Sometimes, prescription pain medicine is needed. HOME CARE INSTRUCTIONS:  Do not smoke.  Keep your blood glucose level in the range that you and your health care provider have determined acceptable for you.  Keep your blood pressure level in the range that you and your health care provider have determined acceptable for you.  Eat a well-balanced diet.  Be active every day.  Check your feet every day. SEEK MEDICAL CARE IF:   You have burning, stabbing, or aching pain in the legs or feet.  You are unable to feel pressure or pain in your feet.  You develop problems with digestion such as:  Nausea.  Vomiting.  Bloating.  Constipation.  Diarrhea.  Abdominal pain.  You have difficulty with urination, such as:  Incontinence.  Retention.  You have palpitations.  You develop orthostatic hypotension. When you stand up you may feel:  Dizzy.  Weak.  Faint.  You cannot attain and maintain an erection (in men).  You have vaginal dryness and problems with decreased sexual desire and arousal (in women).  You have severe pain in  your thighs, legs, or buttocks.  You have unexplained weight loss. Document Released: 12/03/2001 Document Revised: 07/15/2013 Document Reviewed: 03/05/2013 [facility] Patient Information 2015 [place], [place]. This information is not intended to replace advice given to you by your health care provider. Make sure you discuss any questions you have with your health care  provider. Diabetes and Foot Care Diabetes may cause you to have problems because of poor blood supply (circulation) to your feet and legs. This may cause the skin on your feet to become thinner, break easier, and heal more slowly. Your skin may become dry, and the skin may peel and crack. You may also have nerve damage in your legs and feet causing decreased feeling in them. You may not notice minor injuries to your feet that could lead to infections or more serious problems. Taking care of your feet is one of the most important things you can do for yourself.  HOME CARE INSTRUCTIONS  Wear shoes at all times, even in the house. Do not go barefoot. Bare feet are easily injured.  Check your feet daily for blisters, cuts, and redness. If you cannot see the bottom of your feet, use a mirror or ask someone for help.  Wash your feet with warm water (do not use hot water) and mild soap. Then pat your feet and the areas between your toes until they are completely dry. Do not soak your feet as this can dry your skin.  Apply a moisturizing lotion or petroleum jelly (that does not contain alcohol and is unscented) to the skin on your feet and to dry, brittle toenails. Do not apply lotion between your toes.  Trim your toenails straight across. Do not dig under them or around the cuticle. File the edges of your nails with an emery board or nail file.  Do not cut corns or calluses or try to remove them with medicine.  Wear clean socks or stockings every day. Make sure they are not too tight. Do not wear knee-high stockings since they may decrease blood flow to your legs.  Wear shoes that fit properly and have enough cushioning. To break in new shoes, wear them for just a few hours a day. This prevents you from injuring your feet. Always look in your shoes before you put them on to be sure there are no objects inside.  Do not cross your legs. This may decrease the blood flow to your feet.  If you find a  minor scrape, cut, or break in the skin on your feet, keep it and the skin around it clean and dry. These areas may be cleansed with mild soap and water. Do not cleanse the area with peroxide, alcohol, or iodine.  When you remove an adhesive bandage, be sure not to damage the skin around it.  If you have a wound, look at it several times a day to make sure it is healing.  Do not use heating pads or hot water bottles. They may burn your skin. If you have lost feeling in your feet or legs, you may not know it is happening until it is too late.  Make sure your health care provider performs a complete foot exam at least annually or more often if you have foot problems. Report any cuts, sores, or bruises to your health care provider immediately. SEEK MEDICAL CARE IF:   You have an injury that is not healing.  You have cuts or breaks in the skin.  You have an ingrown  nail.  You notice redness on your legs or feet.  You feel burning or tingling in your legs or feet.  You have pain or cramps in your legs and feet.  Your legs or feet are numb.  Your feet always feel cold. SEEK IMMEDIATE MEDICAL CARE IF:   There is increasing redness, swelling, or pain in or around a wound.  There is a red line that goes up your leg.  Pus is coming from a wound.  You develop a fever or as directed by your health care provider.  You notice a bad smell coming from an ulcer or wound. Document Released: 09/21/2000 Document Revised: 05/27/2013 Document Reviewed: 03/03/2013 [facility] Patient Information 2015 [place], [place]. This information is not intended to replace advice given to you by your health care provider. Make sure you discuss any questions you have with your health care provider.

## 2015-01-19 NOTE — Progress Notes (Signed)
   Subjective:    Patient ID: Robert [name], male    DOB: Nov 27, 1960, 54 y.o.   MRN: [id number]  HPI  54 year old male presents the office today for painful, elongated toenails which she is unable to trim himself. He is diabetic and he states that he has had neuropathy. He denies any history of ulceration or any claudication symptoms. No other complaints at this time.   Review of Systems  Endocrine: Positive for cold intolerance.  Genitourinary: Positive for difficulty urinating.  Musculoskeletal: Positive for gait problem.  Neurological: Positive for weakness and numbness.  All other systems reviewed and are negative.      Objective:   Physical Exam AAO 3, NAD DP/PT pulses palpable, CRT less than 3 seconds Decreased sensation with Simms Weinstein monofilament, decreased protective sensation, Achilles tendon reflex intact. Nails are dystrophic, discolored, hypertrophic, brittle 10. There suggest a tenderness on nails 1-5 bilaterally. There is no swelling erythema or drainage on the nail sites. No open lesions or pre-ulcerative lesions identified bilaterally no other areas of tenderness to bilateral lower extremities. MMT 5/5, ROM WNL No pain with calf compression, swelling, warmth, erythema       Assessment & Plan:  54 year old male with symptomatically onychomycosis, neuropathy -Treatment options discussed including alternatives, risks, complications  -Nail sharply debrided 10 without competitions/bleeding -Discussed importance of daily foot inspection -Follow-up in 3 months or sooner if any palms are to arise. In the meantime occur should call the office with any questions, concerns, changes symptoms. -

## 2015-01-24 ENCOUNTER — Encounter: Payer: Self-pay | Admitting: Podiatry

## 2015-01-26 ENCOUNTER — Encounter: Payer: Self-pay | Admitting: *Deleted

## 2015-02-02 ENCOUNTER — Other Ambulatory Visit: Payer: Self-pay | Admitting: *Deleted

## 2015-02-02 ENCOUNTER — Encounter: Payer: Self-pay | Admitting: Vascular Surgery

## 2015-02-02 DIAGNOSIS — N186 End stage renal disease: Secondary | ICD-10-CM

## 2015-02-02 DIAGNOSIS — Z01810 Encounter for preprocedural cardiovascular examination: Secondary | ICD-10-CM

## 2015-02-16 ENCOUNTER — Telehealth: Payer: Self-pay | Admitting: *Deleted

## 2015-02-16 NOTE — Telephone Encounter (Signed)
I completed safe step paperwork and we will try to get them approved through his insurance. He will be called once approved.

## 2015-02-16 NOTE — Telephone Encounter (Addendum)
Pt states Dr. Jacqualyn [name] was to look into diabetic inserts for his dress shoes, and pt states he found that he was eligible for diabetic shoes and inserts.  Please advise. I informed pt of Dr. Leigh Aurora progress, and informed that we would call once approval was received from his ins.  Pt states understanding.

## 2015-02-17 ENCOUNTER — Encounter: Payer: Self-pay | Admitting: Surgery

## 2015-02-17 ENCOUNTER — Other Ambulatory Visit: Payer: Self-pay | Admitting: Vascular Surgery

## 2015-02-17 DIAGNOSIS — N186 End stage renal disease: Secondary | ICD-10-CM

## 2015-02-17 DIAGNOSIS — Z01810 Encounter for preprocedural cardiovascular examination: Secondary | ICD-10-CM

## 2015-02-18 ENCOUNTER — Ambulatory Visit (INDEPENDENT_AMBULATORY_CARE_PROVIDER_SITE_OTHER)
Admission: RE | Admit: 2015-02-18 | Discharge: 2015-02-18 | Disposition: A | Discharge status: Home / Self Care | Payer: BLUE CROSS/BLUE SHIELD | Source: Ambulatory Visit | Attending: Vascular Surgery | Admitting: Vascular Surgery

## 2015-02-18 ENCOUNTER — Ambulatory Visit (HOSPITAL_COMMUNITY)
Admission: RE | Admit: 2015-02-18 | Discharge: 2015-02-18 | Disposition: A | Discharge status: Home / Self Care | Payer: BLUE CROSS/BLUE SHIELD | Source: Ambulatory Visit | Attending: Surgery | Admitting: Surgery

## 2015-02-18 ENCOUNTER — Encounter: Payer: Self-pay | Admitting: Surgery

## 2015-02-18 ENCOUNTER — Ambulatory Visit (INDEPENDENT_AMBULATORY_CARE_PROVIDER_SITE_OTHER): Payer: BLUE CROSS/BLUE SHIELD | Admitting: Surgery

## 2015-02-18 ENCOUNTER — Other Ambulatory Visit: Payer: Self-pay

## 2015-02-18 VITALS — BP 123/73 | HR 79 | Resp 16 | Ht 72.0 in | Wt 325.0 lb

## 2015-02-18 DIAGNOSIS — Z01810 Encounter for preprocedural cardiovascular examination: Secondary | ICD-10-CM | POA: Insufficient documentation

## 2015-02-18 DIAGNOSIS — N186 End stage renal disease: Secondary | ICD-10-CM

## 2015-02-18 NOTE — Progress Notes (Signed)
Patient name: Robert [name] MRN: [id number] DOB: [birth year] Sex: male   Referred by: Criselda Peaches  Reason for referral:  Chief Complaint  Patient presents with  . New Evaluation    Pt Ref by Dr. Moshe Cipro :  Elizabeth Sauer for AVF      HISTORY OF PRESENT ILLNESS: This is a 54 year old gentleman who comes in today for evaluation for hemodialysis.  The patient is currently seeking peritoneal dialysis.  He is left-handed.  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  Diabetes affected his right kidney.  He recently had to have a right IJ catheter placed for 6 months secondary to poor clearances with PD.  He is now transitioning to hemodialysis.  The patient suffers from diabetes.  His hemoglobin A1c was 5.9 in March but went up to 7.9 in April.  He is medically managed for hypertension.  He takes a statin for hypercholesterolemia.  He is a nonsmoker.  Past Medical History  Diagnosis Date  . Diabetes mellitus without complication   . Hypertension   . Thyroid disease   . Sleep apnea 06/18/11    split-night sleep study- [facility].  . Hyperlipidemia   . PAT (paroxysmal atrial tachycardia)   . PSVT (paroxysmal supraventricular tachycardia)   . Blood transfusion without reported diagnosis   . Chronic kidney disease     Stage IV  . CPAP (continuous positive airway pressure) dependence   . Dysrhythmia   . Shortness of breath   . Hypothyroidism   . GERD (gastroesophageal reflux disease)     n/v a lot  . Headache(784.0)     slight dull h/a due kidney failure  . Cancer     skin ca right shoulder  . Anemia     low hgb at present  . Umbilical hernia     will repair with thi surgery    Past Surgical History  Procedure Laterality Date  . Nephrectomy Left 1974  . Renal biopsy Right 2012  . Skin cancer excision    . Capd insertion N/A 05/18/2014    Procedure: LAPAROSCOPIC INSERTION CONTINUOUS AMBULATORY PERITONEAL  DIALYSIS  (CAPD) CATHETER;  Surgeon: Ralene Ok, MD;  Location: [facility];  Service: General;  Laterality: N/A;  . Umbilical hernia repair N/A 05/18/2014    Procedure: HERNIA REPAIR UMBILICAL ADULT;  Surgeon: Ralene Ok, MD;  Location: [facility];  Service: General;  Laterality: N/A;  . Laparoscopic repositioning capd catheter N/A 06/16/2014    Procedure: LAPAROSCOPIC REPOSITIONING CAPD CATHETER;  Surgeon: Ralene Ok, MD;  Location: [facility];  Service: General;  Laterality: N/A;    History   Social History  . Marital Status: Married    Spouse Name: N/A  . Number of Children: 3  . 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 Smoking status: Former Smoker    Types: Pipe    Quit date: 12/06/2013  . Smokeless tobacco: Never Used  . Alcohol Use: 0.6 oz/week    1 Glasses of wine per week     Comment: 1 glass of wine per week  . Drug Use: No  . Sexual Activity: Not on file   Other Topics Concern  . Not on file   Social History Narrative    Family History  Problem Relation Age of Onset  . Colon polyps Father   . Colon cancer Neg Hx   .  Stomach cancer Neg Hx     Allergies as of 02/18/2015  . (No Known Allergies)    Current Outpatient Prescriptions on File Prior to Visit  Medication Sig Dispense Refill  . aspirin 81 MG tablet Take 81 mg by mouth every morning.     Marland Kitchen BYSTOLIC 10 MG tablet Take 15 mg by mouth every morning.     . calcium carbonate (TUMS - DOSED IN MG ELEMENTAL CALCIUM) 500 MG chewable tablet Chew 4 tablets (800 mg of elemental calcium total) by mouth 3 (three) times daily with meals. 90 tablet 0  . cetirizine (ZYRTEC) 10 MG tablet Take 10 mg by mouth every morning.     Marland Kitchen CRESTOR 20 MG tablet TAKE 1 TABLET (20 MG TOTAL) BY MOUTH DAILY. (Patient taking differently: TAKE 1 TABLET (20 MG TOTAL) BY MOUTH every other DAy) 90 tablet 1  . docusate sodium (COLACE) 100 MG capsule Take 100 mg by mouth every evening.     .  fluocinonide cream (LIDEX) [id number] % Apply 1 application topically daily as needed (Skin inflammation (hand, thigh, back)).     [name] 1000 MG chewable tablet     . gentamicin cream (GARAMYCIN) 0.1 % Apply 1 application topically once a week. Apply weekly to peritoneal catheter    . glucose blood (ONETOUCH VERIO) test strip Use as instructed to check blood sugar 6 a day dx code E11.29 550 each 3  . HUMULIN R 500 UNIT/ML SOLN injection 3 (three) times daily with meals. 13-13-15    . Insulin Glargine (LANTUS SOLOSTAR) 100 UNIT/ML Solostar Pen Inject 22 Units into the skin daily. (Patient taking differently: Inject 30 Units into the skin daily. ) 15 mL 3  . Insulin Syringe-Needle U-100 (INSULIN SYRINGE 1CC/31GX5/16") 31G X 5/16" 1 ML MISC Use to inject insulin 100 each 3  . levothyroxine (SYNTHROID, LEVOTHROID) 100 MCG tablet TAKE 1 TABLET (100 MCG TOTAL) BY MOUTH DAILY. 90 tablet 1  . NOVOFINE 32G X 6 MM MISC     . NOVOFINE 32G X 6 MM MISC USE 4 PEN NEEDLES PER DAY TO INJECT INSULIN 400 each 1  . OVER THE COUNTER MEDICATION Renovite    . polyethylene glycol (MIRALAX / GLYCOLAX) packet Take 17 g by mouth daily.    Marland Kitchen senna (SENOKOT) 8.6 MG tablet Take 1 tablet by mouth every evening.     Marland Kitchen BYSTOLIC 5 MG tablet Take 5 mg by mouth daily.  3  . Calcium Carbonate Antacid (TUMS E-X SUGAR FREE PO) Take by mouth.    . furosemide (LASIX) 40 MG tablet Take 40 mg by mouth 2 (two) times daily.    . hydrALAZINE (APRESOLINE) 50 MG tablet Take 100 mg by mouth 2 (two) times daily.     . tamsulosin (FLOMAX) 0.4 MG CAPS capsule Take 1 capsule (0.4 mg total) by mouth daily. (Patient not taking: Reported on 02/18/2015) 30 capsule 0   No current facility-administered medications on file prior to visit.     REVIEW OF SYSTEMS: Cardiovascular: No chest pain, chest pressure, palpitations, No claudication or rest pain,  No history of DVT or phlebitis.  Positive for shortness of breath with exertion Pulmonary: No  productive cough, asthma or wheezing. Neurologic: No weakness, paresthesias, aphasia, or amaurosis. No dizziness. Hematologic: No bleeding problems or clotting disorders. Musculoskeletal: No joint pain or joint swelling. Gastrointestinal: No blood in stool or hematemesis Genitourinary: No dysuria or hematuria. Psychiatric:: No history of major depression. Integumentary: No rashes or ulcers. Constitutional: No  fever or chills.  PHYSICAL EXAMINATION:  Filed Vitals:   02/18/15 1501  BP: 123/73  Pulse: 79  Resp: 16  Height: 6' (1.829 m)  Weight: 325 lb (147.419 kg)  SpO2: 94%   Body mass index is 44.07 kg/(m^2). General: The patient appears their stated age.   HEENT:  No gross abnormalities Pulmonary: Respirations are non-labored Musculoskeletal: There are no major deformities.   Neurologic: No focal weakness or paresthesias are detected, Skin: There are no ulcer or rashes noted. Psychiatric: The patient has normal affect. Cardiovascular: There is a regular rate and rhythm without significant murmur appreciated.  Palpable right radial pulse  Diagnostic Studies: I have reviewed his arterial duplex studies which show triphasic waveforms. Vein mapping was reviewed.  He has an excellent cephalic vein with all diameters greater than 0.5 cm.    Assessment:  End-stage renal disease Plan: The patient will be scheduled for a right radiocephalic fistula.  The risks and benefits of the procedure were discussed with the patient including the risk of non-maturity, the need for future interventions, and the risk of steal syndrome.  He would like to get this done as soon as possible.  I will try to have this scheduled for Friday, May 20.     Eldridge Abrahams, M.D. [facility] Office: [phone] Pager:  [phone]

## 2015-02-23 ENCOUNTER — Other Ambulatory Visit: Payer: Self-pay | Admitting: Nephrology

## 2015-02-23 ENCOUNTER — Ambulatory Visit
Admission: RE | Admit: 2015-02-23 | Discharge: 2015-02-23 | Disposition: A | Discharge status: Home / Self Care | Payer: BLUE CROSS/BLUE SHIELD | Source: Ambulatory Visit | Attending: Nephrology | Admitting: Nephrology

## 2015-02-23 ENCOUNTER — Encounter (HOSPITAL_COMMUNITY): Payer: Self-pay | Admitting: *Deleted

## 2015-02-23 ENCOUNTER — Encounter (HOSPITAL_COMMUNITY): Payer: Self-pay

## 2015-02-23 ENCOUNTER — Other Ambulatory Visit (INDEPENDENT_AMBULATORY_CARE_PROVIDER_SITE_OTHER): Payer: BLUE CROSS/BLUE SHIELD

## 2015-02-23 ENCOUNTER — Ambulatory Visit: Payer: Self-pay | Admitting: Vascular Surgery

## 2015-02-23 ENCOUNTER — Other Ambulatory Visit (HOSPITAL_COMMUNITY): Payer: Self-pay

## 2015-02-23 DIAGNOSIS — E1165 Type 2 diabetes mellitus with hyperglycemia: Secondary | ICD-10-CM | POA: Diagnosis not present

## 2015-02-23 DIAGNOSIS — IMO0002 Reserved for concepts with insufficient information to code with codable children: Secondary | ICD-10-CM

## 2015-02-23 DIAGNOSIS — T85611A Breakdown (mechanical) of intraperitoneal dialysis catheter, initial encounter: Secondary | ICD-10-CM

## 2015-02-23 DIAGNOSIS — E291 Testicular hypofunction: Secondary | ICD-10-CM

## 2015-02-23 LAB — GLUCOSE, RANDOM: GLUCOSE: 124 mg/dL — AB (ref 70–99)

## 2015-02-24 ENCOUNTER — Other Ambulatory Visit: Payer: Self-pay

## 2015-02-24 DIAGNOSIS — Z992 Dependence on renal dialysis: Secondary | ICD-10-CM

## 2015-02-24 HISTORY — DX: Dependence on renal dialysis: Z99.2

## 2015-02-24 LAB — TESTOSTERONE, FREE, TOTAL, SHBG
SEX HORMONE BINDING: 15.4 nmol/L — AB (ref 19.3–76.4)
TESTOSTERONE: 147 ng/dL — AB (ref 348–1197)
Testosterone, Free: 15.7 pg/mL (ref 7.2–24.0)

## 2015-02-24 LAB — FRUCTOSAMINE: Fructosamine: 367 umol/L — ABNORMAL HIGH (ref 0–285)

## 2015-02-25 ENCOUNTER — Ambulatory Visit (HOSPITAL_COMMUNITY): Payer: BLUE CROSS/BLUE SHIELD

## 2015-02-25 ENCOUNTER — Encounter (HOSPITAL_COMMUNITY): Payer: Self-pay | Admitting: Anesthesiology

## 2015-02-25 ENCOUNTER — Ambulatory Visit (HOSPITAL_COMMUNITY)
Admission: RE | Admit: 2015-02-25 | Discharge: 2015-02-25 | Disposition: A | Discharge status: Home / Self Care | Payer: BLUE CROSS/BLUE SHIELD | Source: Ambulatory Visit | Attending: Surgery | Admitting: Surgery

## 2015-02-25 ENCOUNTER — Encounter (HOSPITAL_COMMUNITY)
Admission: RE | Disposition: A | Discharge status: Home / Self Care | Payer: Self-pay | Source: Ambulatory Visit | Attending: Surgery

## 2015-02-25 ENCOUNTER — Other Ambulatory Visit: Payer: Self-pay

## 2015-02-25 ENCOUNTER — Ambulatory Visit (HOSPITAL_COMMUNITY): Payer: BLUE CROSS/BLUE SHIELD | Admitting: Anesthesiology

## 2015-02-25 DIAGNOSIS — I120 Hypertensive chronic kidney disease with stage 5 chronic kidney disease or end stage renal disease: Secondary | ICD-10-CM | POA: Insufficient documentation

## 2015-02-25 DIAGNOSIS — N186 End stage renal disease: Secondary | ICD-10-CM

## 2015-02-25 DIAGNOSIS — Z87891 Personal history of nicotine dependence: Secondary | ICD-10-CM | POA: Insufficient documentation

## 2015-02-25 DIAGNOSIS — N185 Chronic kidney disease, stage 5: Secondary | ICD-10-CM | POA: Diagnosis not present

## 2015-02-25 DIAGNOSIS — Z419 Encounter for procedure for purposes other than remedying health state, unspecified: Secondary | ICD-10-CM

## 2015-02-25 DIAGNOSIS — G4730 Sleep apnea, unspecified: Secondary | ICD-10-CM | POA: Diagnosis not present

## 2015-02-25 DIAGNOSIS — Z48812 Encounter for surgical aftercare following surgery on the circulatory system: Secondary | ICD-10-CM

## 2015-02-25 DIAGNOSIS — I10 Essential (primary) hypertension: Secondary | ICD-10-CM | POA: Diagnosis not present

## 2015-02-25 HISTORY — DX: Polyneuropathy, unspecified: G62.9

## 2015-02-25 HISTORY — DX: Unspecified macular degeneration: H35.30

## 2015-02-25 HISTORY — PX: INSERTION OF DIALYSIS CATHETER: SHX1324

## 2015-02-25 HISTORY — DX: Constipation, unspecified: K59.00

## 2015-02-25 HISTORY — DX: Dermatitis, unspecified: L30.9

## 2015-02-25 HISTORY — PX: AV FISTULA PLACEMENT: SHX1204

## 2015-02-25 LAB — POCT I-STAT, CHEM 8
BUN: 101 mg/dL — AB (ref 6–20)
CALCIUM ION: 0.94 mmol/L — AB (ref 1.12–1.23)
Chloride: 100 mmol/L — ABNORMAL LOW (ref 101–111)
Creatinine, Ser: >18 mg/dL — ABNORMAL HIGH (ref 0.61–1.24)
Glucose, Bld: 98 mg/dL (ref 65–99)
HCT: 22 % — ABNORMAL LOW (ref 39.0–52.0)
HEMOGLOBIN: 7.5 g/dL — AB (ref 13.0–17.0)
Potassium: 4.3 mmol/L (ref 3.5–5.1)
Sodium: 136 mmol/L (ref 135–145)
TCO2: 17 mmol/L (ref 0–100)

## 2015-02-25 LAB — GLUCOSE, CAPILLARY
GLUCOSE-CAPILLARY: 96 mg/dL (ref 65–99)
GLUCOSE-CAPILLARY: 95 mg/dL (ref 65–99)

## 2015-02-25 SURGERY — INSERTION OF DIALYSIS CATHETER
Anesthesia: Monitor Anesthesia Care | Site: Chest | Laterality: Right

## 2015-02-25 MED ORDER — SODIUM CHLORIDE 0.9 % IV SOLN: INTRAVENOUS | Status: DC

## 2015-02-25 MED ORDER — ARTIFICIAL TEARS OP OINT
TOPICAL_OINTMENT | OPHTHALMIC | Status: AC
  Filled 2015-02-25: qty 3.5

## 2015-02-25 MED ORDER — SODIUM CHLORIDE 0.9 % IJ SOLN
INTRAMUSCULAR | Status: AC
  Filled 2015-02-25: qty 20

## 2015-02-25 MED ORDER — CHLORHEXIDINE GLUCONATE CLOTH 2 % EX PADS: 6.0000 | MEDICATED_PAD | Freq: Once | CUTANEOUS | Status: DC

## 2015-02-25 MED ORDER — ONDANSETRON HCL 4 MG/2ML IJ SOLN
INTRAMUSCULAR | Status: AC
  Filled 2015-02-25: qty 2

## 2015-02-25 MED ORDER — LIDOCAINE HCL (CARDIAC) 20 MG/ML IV SOLN
INTRAVENOUS | Status: DC | PRN
  Administered 2015-02-25: 50 mg via INTRAVENOUS

## 2015-02-25 MED ORDER — 0.9 % SODIUM CHLORIDE (POUR BTL) OPTIME
TOPICAL | Status: DC | PRN
  Administered 2015-02-25: 1000 mL

## 2015-02-25 MED ORDER — PROPOFOL 10 MG/ML IV BOLUS
INTRAVENOUS | Status: DC | PRN
  Administered 2015-02-25 (×4): 20 mg via INTRAVENOUS
  Administered 2015-02-25: 140 mg via INTRAVENOUS

## 2015-02-25 MED ORDER — ROCURONIUM BROMIDE 50 MG/5ML IV SOLN
INTRAVENOUS | Status: AC
  Filled 2015-02-25: qty 1

## 2015-02-25 MED ORDER — MIDAZOLAM HCL 5 MG/5ML IJ SOLN
INTRAMUSCULAR | Status: DC | PRN
  Administered 2015-02-25: 2 mg via INTRAVENOUS

## 2015-02-25 MED ORDER — FENTANYL CITRATE (PF) 250 MCG/5ML IJ SOLN
INTRAMUSCULAR | Status: DC | PRN
  Administered 2015-02-25 (×5): 50 ug via INTRAVENOUS

## 2015-02-25 MED ORDER — FENTANYL CITRATE (PF) 250 MCG/5ML IJ SOLN
INTRAMUSCULAR | Status: AC
  Filled 2015-02-25: qty 5

## 2015-02-25 MED ORDER — OXYCODONE HCL 5 MG PO TABS: 5.0000 mg | ORAL_TABLET | Freq: Four times a day (QID) | ORAL | Status: DC | PRN

## 2015-02-25 MED ORDER — PROMETHAZINE HCL 25 MG/ML IJ SOLN: 6.2500 mg | INTRAMUSCULAR | Status: DC | PRN

## 2015-02-25 MED ORDER — SODIUM CHLORIDE 0.9 % IR SOLN
Status: DC | PRN
  Administered 2015-02-25: 500 mL

## 2015-02-25 MED ORDER — PROTAMINE SULFATE 10 MG/ML IV SOLN
INTRAVENOUS | Status: AC
  Filled 2015-02-25: qty 5

## 2015-02-25 MED ORDER — PROTAMINE SULFATE 10 MG/ML IV SOLN
INTRAVENOUS | Status: DC | PRN
  Administered 2015-02-25: 25 mg via INTRAVENOUS

## 2015-02-25 MED ORDER — DEXTROSE 5 % IV SOLN
1.5000 g | INTRAVENOUS | Status: AC
  Administered 2015-02-25: 1.5 g via INTRAVENOUS
  Filled 2015-02-25: qty 1.5

## 2015-02-25 MED ORDER — SUCCINYLCHOLINE CHLORIDE 20 MG/ML IJ SOLN
INTRAMUSCULAR | Status: AC
  Filled 2015-02-25: qty 1

## 2015-02-25 MED ORDER — MIDAZOLAM HCL 2 MG/2ML IJ SOLN
INTRAMUSCULAR | Status: AC
  Filled 2015-02-25: qty 2

## 2015-02-25 MED ORDER — OXYCODONE HCL 5 MG PO TABS
ORAL_TABLET | ORAL | Status: AC
  Filled 2015-02-25: qty 1

## 2015-02-25 MED ORDER — HYDROMORPHONE HCL 1 MG/ML IJ SOLN
0.2500 mg | INTRAMUSCULAR | Status: DC | PRN
  Administered 2015-02-25: 0.5 mg via INTRAVENOUS

## 2015-02-25 MED ORDER — SODIUM CHLORIDE 0.9 % IV SOLN
INTRAVENOUS | Status: DC | PRN
  Administered 2015-02-25 (×2): via INTRAVENOUS

## 2015-02-25 MED ORDER — PHENYLEPHRINE HCL 10 MG/ML IJ SOLN
INTRAMUSCULAR | Status: DC | PRN
  Administered 2015-02-25 (×3): 40 ug via INTRAVENOUS

## 2015-02-25 MED ORDER — PROPOFOL 10 MG/ML IV BOLUS
INTRAVENOUS | Status: AC
  Filled 2015-02-25: qty 20

## 2015-02-25 MED ORDER — PHENYLEPHRINE HCL 10 MG/ML IJ SOLN
10.0000 mg | INTRAMUSCULAR | Status: DC | PRN
  Administered 2015-02-25: 10 ug/min via INTRAVENOUS

## 2015-02-25 MED ORDER — STERILE WATER FOR INJECTION IJ SOLN
INTRAMUSCULAR | Status: AC
  Filled 2015-02-25: qty 10

## 2015-02-25 MED ORDER — HEPARIN SODIUM (PORCINE) 1000 UNIT/ML IJ SOLN
INTRAMUSCULAR | Status: DC | PRN
  Administered 2015-02-25: 4.6 mL

## 2015-02-25 MED ORDER — OXYCODONE HCL 5 MG PO TABS
5.0000 mg | ORAL_TABLET | Freq: Once | ORAL | Status: AC
  Administered 2015-02-25: 5 mg via ORAL

## 2015-02-25 MED ORDER — HEPARIN SODIUM (PORCINE) 1000 UNIT/ML IJ SOLN
INTRAMUSCULAR | Status: AC
  Filled 2015-02-25: qty 1

## 2015-02-25 MED ORDER — EPHEDRINE SULFATE 50 MG/ML IJ SOLN
INTRAMUSCULAR | Status: AC
  Filled 2015-02-25: qty 1

## 2015-02-25 MED ORDER — HYDROMORPHONE HCL 1 MG/ML IJ SOLN
INTRAMUSCULAR | Status: AC
  Filled 2015-02-25: qty 1

## 2015-02-25 MED ORDER — LIDOCAINE-EPINEPHRINE (PF) 1 %-1:200000 IJ SOLN
INTRAMUSCULAR | Status: AC
  Filled 2015-02-25: qty 10

## 2015-02-25 MED ORDER — PHENYLEPHRINE 40 MCG/ML (10ML) SYRINGE FOR IV PUSH (FOR BLOOD PRESSURE SUPPORT)
PREFILLED_SYRINGE | INTRAVENOUS | Status: AC
  Filled 2015-02-25: qty 10

## 2015-02-25 MED ORDER — LIDOCAINE-EPINEPHRINE (PF) 1 %-1:200000 IJ SOLN
INTRAMUSCULAR | Status: DC | PRN
  Administered 2015-02-25: 9 mL

## 2015-02-25 MED ORDER — LIDOCAINE HCL (CARDIAC) 20 MG/ML IV SOLN
INTRAVENOUS | Status: AC
  Filled 2015-02-25: qty 5

## 2015-02-25 MED ORDER — HEPARIN SODIUM (PORCINE) 1000 UNIT/ML IJ SOLN
INTRAMUSCULAR | Status: DC | PRN
  Administered 2015-02-25: 3000 [IU] via INTRAVENOUS

## 2015-02-25 SURGICAL SUPPLY — 61 items
ARMBAND PINK RESTRICT EXTREMIT (MISCELLANEOUS) ×4 IMPLANT
BAG DECANTER FOR FLEXI CONT (MISCELLANEOUS) ×2 IMPLANT
BIOPATCH RED 1 DISK 7.0 (GAUZE/BANDAGES/DRESSINGS) ×3 IMPLANT
BIOPATCH RED 1IN DISK 7.0MM (GAUZE/BANDAGES/DRESSINGS) ×1
CANISTER SUCTION 2500CC (MISCELLANEOUS) ×4 IMPLANT
CATH CANNON HEMO 15F 50CM (CATHETERS) IMPLANT
CATH CANNON HEMO 15FR 19 (HEMODIALYSIS SUPPLIES) IMPLANT
CATH CANNON HEMO 15FR 23CM (HEMODIALYSIS SUPPLIES) ×2 IMPLANT
CATH CANNON HEMO 15FR 31CM (HEMODIALYSIS SUPPLIES) IMPLANT
CATH CANNON HEMO 15FR 32 (HEMODIALYSIS SUPPLIES) IMPLANT
CATH CANNON HEMO 15FR 32CM (HEMODIALYSIS SUPPLIES) IMPLANT
CLIP TI MEDIUM 6 (CLIP) ×4 IMPLANT
CLIP TI WIDE RED SMALL 6 (CLIP) ×4 IMPLANT
COVER PROBE W GEL 5X96 (DRAPES) ×4 IMPLANT
COVER TRANSDUCER ULTRASND GEL (DRAPE) ×8 IMPLANT
DRAPE C-ARM 42X72 X-RAY (DRAPES) ×4 IMPLANT
DRAPE CHEST BREAST 15X10 FENES (DRAPES) ×6 IMPLANT
ELECT CAUTERY BLADE 6.4 (BLADE) ×2 IMPLANT
ELECT REM PT RETURN 9FT ADLT (ELECTROSURGICAL) ×4
ELECTRODE REM PT RTRN 9FT ADLT (ELECTROSURGICAL) ×2 IMPLANT
GAUZE SPONGE 2X2 8PLY STRL LF (GAUZE/BANDAGES/DRESSINGS) IMPLANT
GAUZE SPONGE 4X4 16PLY XRAY LF (GAUZE/BANDAGES/DRESSINGS) ×2 IMPLANT
GLOVE BIOGEL PI IND STRL 6.5 (GLOVE) IMPLANT
GLOVE BIOGEL PI IND STRL 7.0 (GLOVE) IMPLANT
GLOVE BIOGEL PI IND STRL 7.5 (GLOVE) ×2 IMPLANT
GLOVE BIOGEL PI INDICATOR 6.5 (GLOVE) ×4
GLOVE BIOGEL PI INDICATOR 7.0 (GLOVE) ×4
GLOVE BIOGEL PI INDICATOR 7.5 (GLOVE) ×6
GLOVE SURG SS PI 7.0 STRL IVOR (GLOVE) ×10 IMPLANT
GLOVE SURG SS PI 7.5 STRL IVOR (GLOVE) ×8 IMPLANT
GOWN STRL REUS W/ TWL LRG LVL3 (GOWN DISPOSABLE) ×4 IMPLANT
GOWN STRL REUS W/ TWL XL LVL3 (GOWN DISPOSABLE) ×2 IMPLANT
GOWN STRL REUS W/TWL LRG LVL3 (GOWN DISPOSABLE) ×12
GOWN STRL REUS W/TWL XL LVL3 (GOWN DISPOSABLE) ×12
HEMOSTAT SNOW SURGICEL 2X4 (HEMOSTASIS) IMPLANT
KIT BASIN OR (CUSTOM PROCEDURE TRAY) ×4 IMPLANT
KIT ROOM TURNOVER OR (KITS) ×4 IMPLANT
LIQUID BAND (GAUZE/BANDAGES/DRESSINGS) ×6 IMPLANT
NDL 18GX1X1/2 (RX/OR ONLY) (NEEDLE) ×2 IMPLANT
NDL HYPO 25GX1X1/2 BEV (NEEDLE) ×2 IMPLANT
NEEDLE 18GX1X1/2 (RX/OR ONLY) (NEEDLE) ×4 IMPLANT
NEEDLE HYPO 25GX1X1/2 BEV (NEEDLE) IMPLANT
NS IRRIG 1000ML POUR BTL (IV SOLUTION) ×4 IMPLANT
PACK CV ACCESS (CUSTOM PROCEDURE TRAY) ×4 IMPLANT
PACK SURGICAL SETUP 50X90 (CUSTOM PROCEDURE TRAY) ×2 IMPLANT
PAD ARMBOARD 7.5X6 YLW CONV (MISCELLANEOUS) ×8 IMPLANT
PENCIL BUTTON HOLSTER BLD 10FT (ELECTRODE) ×2 IMPLANT
SOAP 2 % CHG 4 OZ (WOUND CARE) ×4 IMPLANT
SPONGE GAUZE 2X2 STER 10/PKG (GAUZE/BANDAGES/DRESSINGS) ×2
SUT ETHILON 3 0 PS 1 (SUTURE) ×4 IMPLANT
SUT PROLENE 6 0 CC (SUTURE) ×4 IMPLANT
SUT VIC AB 3-0 SH 27 (SUTURE) ×4
SUT VIC AB 3-0 SH 27X BRD (SUTURE) ×2 IMPLANT
SUT VICRYL 4-0 PS2 18IN ABS (SUTURE) ×6 IMPLANT
SYR 20CC LL (SYRINGE) ×4 IMPLANT
SYR 5ML LL (SYRINGE) ×4 IMPLANT
SYR CONTROL 10ML LL (SYRINGE) ×2 IMPLANT
SYRINGE 10CC LL (SYRINGE) ×4 IMPLANT
TAPE CLOTH SURG 4X10 WHT LF (GAUZE/BANDAGES/DRESSINGS) ×2 IMPLANT
UNDERPAD 30X30 INCONTINENT (UNDERPADS AND DIAPERS) ×4 IMPLANT
WATER STERILE IRR 1000ML POUR (IV SOLUTION) ×4 IMPLANT

## 2015-02-25 NOTE — Anesthesia Preprocedure Evaluation (Addendum)
Anesthesia Evaluation  Patient identified by MRN, date of birth, ID band Patient awake    Reviewed: Allergy & Precautions, H&P , NPO status , Patient's Chart, lab work & pertinent test results, reviewed documented beta blocker date and time   Airway Mallampati: II  TM Distance: >3 FB Neck ROM: full    Dental  (+) Teeth Intact   Pulmonary shortness of breath and with exertion, sleep apnea and Continuous Positive Airway Pressure Ventilation , former smoker,    Pulmonary exam normal       Cardiovascular hypertension, Normal cardiovascular exam+ dysrhythmias Supra Ventricular Tachycardia     Neuro/Psych  Headaches,    GI/Hepatic Neg liver ROS, GERD-  Medicated and Controlled,  Endo/Other  diabetesHypothyroidism   Renal/GU ESRFRenal disease     Musculoskeletal   Abdominal   Peds  Hematology  (+) anemia ,   Anesthesia Other Findings   Reproductive/Obstetrics                         Anesthesia Physical  Anesthesia Plan  ASA: III  Anesthesia Plan: MAC   Post-op Pain Management:    Induction:   Airway Management Planned: LMA and Simple Face Mask  Additional Equipment:   Intra-op Plan:   Post-operative Plan:   Informed Consent: I have reviewed the patients History and Physical, chart, labs and discussed the procedure including the risks, benefits and alternatives for the proposed anesthesia with the patient or authorized representative who has indicated his/her understanding and acceptance.   Dental advisory given  Plan Discussed with: Anesthesiologist, CRNA and Surgeon  Anesthesia Plan Comments:        Anesthesia Quick Evaluation

## 2015-02-25 NOTE — Anesthesia Postprocedure Evaluation (Signed)
Anesthesia Post Note  Patient: Robert [name]  Procedure(s) Performed: Procedure(s) (LRB): INSERTION OF RIGHT INTERNAL JUGULAR DIALYSIS CATHETER (Right) RIGHT ARTERIOVENOUS (AV) FISTULA CREATION (Right)  Anesthesia type: general  Patient location: PACU  Post pain: Pain level controlled  Post assessment: Patient's Cardiovascular Status Stable  Last Vitals:  Filed Vitals:   02/25/15 1030  BP: 154/69  Pulse: 66  Temp:   Resp: 11    Post vital signs: Reviewed and stable  Level of consciousness: sedated  Complications: No apparent anesthesia complications

## 2015-02-25 NOTE — Interval H&P Note (Signed)
History and Physical Interval Note:  02/25/2015 7:23 AM  Robert [name]  has presented today for surgery, with the diagnosis of End Stage Renal Disease N18.6  The various methods of treatment have been discussed with the patient and family. After consideration of risks, benefits and other options for treatment, the patient has consented to  Procedure(s): INSERTION OF DIALYSIS CATHETER (N/A) RIGHT ARTERIOVENOUS (AV) FISTULA CREATION (Right) as a surgical intervention .  The patient's history has been reviewed, patient examined, no change in status, stable for surgery.  I have reviewed the patient's chart and labs.  Questions were answered to the patient's satisfaction.     [name]

## 2015-02-25 NOTE — Op Note (Signed)
    Patient name: Robert [name] MRN: [id number] DOB: Feb 11, 1961 Sex: male  02/25/2015 Pre-operative Diagnosis: End-stage renal disease Post-operative diagnosis:  Same Surgeon:  Robert [name] Assistants:  None Procedure:   #1: Ultrasound-guided placement of right internal jugular vein tunneled dialysis catheter   #2: Right radiocephalic fistula Anesthesia:  Gen. Blood Loss:  See anesthesia record Specimens:  None  Findings:  Adequate vein and artery  Indications:  This is a right-handed gentleman with end-stage renal disease. He has been using peritoneal dialysis but is here today for catheter and the fistula. Vein mapping shows an adequate vein.  Procedure:  The patient was identified in the holding area and taken to [place] 12  The patient was then placed supine on the table. general anesthesia was administered.  The patient was prepped and draped in the usual sterile fashion.  A time out was called and antibiotics were administered.  Ultrasound was used to evaluate the right internal jugular vein was widely patent and easily compressible. 11 blade was used to make a skin break in the right neck. The right internal jugular vein was cannulated under ultrasound guidance with an 18-gauge needle. An 035 wire was advanced into the inferior vena cava under fluoroscopic visualization. The subcutaneous tissue was dilated with sequential dilators and peel-away sheath was place. A 23 cm tunneled dialysis catheter was inserted over the wire through the peel-away sheath which was then removed. The skin exit site was selected. A skin neck was made and a subcutaneous tunnel was created and dilated. The cath was brought through the tunnel and the cuff was situated at the skin exit site. Fluoroscopy confirmed the catheter tip was in good position. Both ports flushed and aspirated without difficulty after connecting the posterior catheter. The catheter was sutured in place with 3-0 nylon. The neck incision  was closed with 4-0 Vicryl and Dermabond.  The patient was then reprepped and draped. I evaluated the right cephalic vein with ultrasound which was adequate approximately 3 mm. A longitudinal incision was made with #10 blade. Cautery was used to subcutaneous tissue. The vein was dissected free circumferentially and immobilized throughout the length of the incision. Side branches were divided between silk ties. I then dissected out the artery. This was a 2.5 mm auto without significant calcification. Once adequate visualization was obtained, 2000 units of heparin were given. The artery was occluded at the heparin circulated. The vein was ligated distally after marking it with an ink pen for orientation. The vein dilated nicely with heparinized saline. It was spatulated. A #11 blade was used to make an arteriotomy which was extended longitudinally with Potts scissors. A running end-to-side anastomosis was created with 6-0 Prolene. Upon completion, the appropriate flushing maneuvers were performed of the anastomosis was completed. The heparin was reversed with protamine. There was excellent Doppler signal the radial artery as well as within the fistula. Once hemostasis was satisfactory the incision was closed with 2 layers of 3-0 Vicryl followed by Dermabond. There were no immediate complications.   Disposition:  To PACU in stable condition.   Robert [name], M.D. [facility] Office: [phone] Pager:  [phone]

## 2015-02-25 NOTE — Progress Notes (Signed)
Dialysis access record chest xray report report faxed to [facility] spoke with marty renal pa ok to do dialysis tomorrow k=4.2

## 2015-02-25 NOTE — Transfer of Care (Signed)
Immediate Anesthesia Transfer of Care Note  Patient: Robert [name]  Procedure(s) Performed: Procedure(s): INSERTION OF RIGHT INTERNAL JUGULAR DIALYSIS CATHETER (Right) RIGHT ARTERIOVENOUS (AV) FISTULA CREATION (Right)  Patient Location: PACU  Anesthesia Type:General  Level of Consciousness: awake, alert , oriented, patient cooperative and responds to stimulation  Airway & Oxygen Therapy: Patient Spontanous Breathing and Patient connected to face mask oxygen  Post-op Assessment: Report given to RN, Post -op Vital signs reviewed and stable and Patient moving all extremities X 4  Post vital signs: stable  Last Vitals:  Filed Vitals:   02/25/15 0637  BP: 130/49  Pulse: 69  Temp: 36.8 C  Resp: 16    Complications: No apparent anesthesia complications

## 2015-02-25 NOTE — Anesthesia Procedure Notes (Signed)
Procedure Name: LMA Insertion Date/Time: 02/25/2015 8:21 AM Performed by: Maude Leriche D Pre-anesthesia Checklist: Patient identified, Emergency Drugs available, Suction available, Patient being monitored and Timeout performed Patient Re-evaluated:Patient Re-evaluated prior to inductionOxygen Delivery Method: Circle system utilized Preoxygenation: Pre-oxygenation with 100% oxygen Intubation Type: IV induction Ventilation: Mask ventilation without difficulty LMA: LMA inserted LMA Size: 5.0 Number of attempts: 1 Placement Confirmation: positive ETCO2 and breath sounds checked- equal and bilateral Tube secured with: Tape Dental Injury: Teeth and Oropharynx as per pre-operative assessment

## 2015-02-25 NOTE — H&P (View-Only) (Signed)
Patient name: Robert [name] MRN: [id number] DOB: Apr 03, 1961 Sex: male   Referred by: Criselda Peaches  Reason for referral:  Chief Complaint  Patient presents with  . New Evaluation    Pt Ref by Dr. Moshe Cipro :  Elizabeth Sauer for AVF      HISTORY OF PRESENT ILLNESS: This is a 54 year old gentleman who comes in today for evaluation for hemodialysis.  The patient is currently seeking peritoneal dialysis.  He is left-handed.  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  Diabetes affected his right kidney.  He recently had to have a right IJ catheter placed for 6 months secondary to poor clearances with PD.  He is now transitioning to hemodialysis.  The patient suffers from diabetes.  His hemoglobin A1c was 5.9 in March but went up to 7.9 in April.  He is medically managed for hypertension.  He takes a statin for hypercholesterolemia.  He is a nonsmoker.  Past Medical History  Diagnosis Date  . Diabetes mellitus without complication   . Hypertension   . Thyroid disease   . Sleep apnea 06/18/11    split-night sleep study- [facility].  . Hyperlipidemia   . PAT (paroxysmal atrial tachycardia)   . PSVT (paroxysmal supraventricular tachycardia)   . Blood transfusion without reported diagnosis   . Chronic kidney disease     Stage IV  . CPAP (continuous positive airway pressure) dependence   . Dysrhythmia   . Shortness of breath   . Hypothyroidism   . GERD (gastroesophageal reflux disease)     n/v a lot  . Headache(784.0)     slight dull h/a due kidney failure  . Cancer     skin ca right shoulder  . Anemia     low hgb at present  . Umbilical hernia     will repair with thi surgery    Past Surgical History  Procedure Laterality Date  . Nephrectomy Left 1974  . Renal biopsy Right 2012  . Skin cancer excision    . Capd insertion N/A 05/18/2014    Procedure: LAPAROSCOPIC INSERTION CONTINUOUS AMBULATORY PERITONEAL  DIALYSIS  (CAPD) CATHETER;  Surgeon: Ralene Ok, MD;  Location: [place];  Service: General;  Laterality: N/A;  . Umbilical hernia repair N/A 05/18/2014    Procedure: HERNIA REPAIR UMBILICAL ADULT;  Surgeon: Ralene Ok, MD;  Location: [place];  Service: General;  Laterality: N/A;  . Laparoscopic repositioning capd catheter N/A 06/16/2014    Procedure: LAPAROSCOPIC REPOSITIONING CAPD CATHETER;  Surgeon: Ralene Ok, MD;  Location: [place];  Service: General;  Laterality: N/A;    History   Social History  . Marital Status: Married    Spouse Name: N/A  . Number of Children: 3  . 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 Smoking status: Former Smoker    Types: Pipe    Quit date: 12/06/2013  . Smokeless tobacco: Never Used  . Alcohol Use: 0.6 oz/week    1 Glasses of wine per week     Comment: 1 glass of wine per week  . Drug Use: No  . Sexual Activity: Not on file   Other Topics Concern  . Not on file   Social History Narrative    Family History  Problem Relation Age of Onset  . Colon polyps Father   . Colon cancer Neg Hx   .  Stomach cancer Neg Hx     Allergies as of 02/18/2015  . (No Known Allergies)    Current Outpatient Prescriptions on File Prior to Visit  Medication Sig Dispense Refill  . aspirin 81 MG tablet Take 81 mg by mouth every morning.     Marland Kitchen BYSTOLIC 10 MG tablet Take 15 mg by mouth every morning.     . calcium carbonate (TUMS - DOSED IN MG ELEMENTAL CALCIUM) 500 MG chewable tablet Chew 4 tablets (800 mg of elemental calcium total) by mouth 3 (three) times daily with meals. 90 tablet 0  . cetirizine (ZYRTEC) 10 MG tablet Take 10 mg by mouth every morning.     Marland Kitchen CRESTOR 20 MG tablet TAKE 1 TABLET (20 MG TOTAL) BY MOUTH DAILY. (Patient taking differently: TAKE 1 TABLET (20 MG TOTAL) BY MOUTH every other DAy) 90 tablet 1  . docusate sodium (COLACE) 100 MG capsule Take 100 mg by mouth every evening.     .  fluocinonide cream (LIDEX) [id number] % Apply 1 application topically daily as needed (Skin inflammation (hand, thigh, back)).     [name] 1000 MG chewable tablet     . gentamicin cream (GARAMYCIN) 0.1 % Apply 1 application topically once a week. Apply weekly to peritoneal catheter    . glucose blood (ONETOUCH VERIO) test strip Use as instructed to check blood sugar 6 a day dx code E11.29 550 each 3  . HUMULIN R 500 UNIT/ML SOLN injection 3 (three) times daily with meals. 13-13-15    . Insulin Glargine (LANTUS SOLOSTAR) 100 UNIT/ML Solostar Pen Inject 22 Units into the skin daily. (Patient taking differently: Inject 30 Units into the skin daily. ) 15 mL 3  . Insulin Syringe-Needle U-100 (INSULIN SYRINGE 1CC/31GX5/16") 31G X 5/16" 1 ML MISC Use to inject insulin 100 each 3  . levothyroxine (SYNTHROID, LEVOTHROID) 100 MCG tablet TAKE 1 TABLET (100 MCG TOTAL) BY MOUTH DAILY. 90 tablet 1  . NOVOFINE 32G X 6 MM MISC     . NOVOFINE 32G X 6 MM MISC USE 4 PEN NEEDLES PER DAY TO INJECT INSULIN 400 each 1  . OVER THE COUNTER MEDICATION Renovite    . polyethylene glycol (MIRALAX / GLYCOLAX) packet Take 17 g by mouth daily.    Marland Kitchen senna (SENOKOT) 8.6 MG tablet Take 1 tablet by mouth every evening.     Marland Kitchen BYSTOLIC 5 MG tablet Take 5 mg by mouth daily.  3  . Calcium Carbonate Antacid (TUMS E-X SUGAR FREE PO) Take by mouth.    . furosemide (LASIX) 40 MG tablet Take 40 mg by mouth 2 (two) times daily.    . hydrALAZINE (APRESOLINE) 50 MG tablet Take 100 mg by mouth 2 (two) times daily.     . tamsulosin (FLOMAX) 0.4 MG CAPS capsule Take 1 capsule (0.4 mg total) by mouth daily. (Patient not taking: Reported on 02/18/2015) 30 capsule 0   No current facility-administered medications on file prior to visit.     REVIEW OF SYSTEMS: Cardiovascular: No chest pain, chest pressure, palpitations, No claudication or rest pain,  No history of DVT or phlebitis.  Positive for shortness of breath with exertion Pulmonary: No  productive cough, asthma or wheezing. Neurologic: No weakness, paresthesias, aphasia, or amaurosis. No dizziness. Hematologic: No bleeding problems or clotting disorders. Musculoskeletal: No joint pain or joint swelling. Gastrointestinal: No blood in stool or hematemesis Genitourinary: No dysuria or hematuria. Psychiatric:: No history of major depression. Integumentary: No rashes or ulcers. Constitutional: No  fever or chills.  PHYSICAL EXAMINATION:  Filed Vitals:   02/18/15 1501  BP: 123/73  Pulse: 79  Resp: 16  Height: 6' (1.829 m)  Weight: 325 lb (147.419 kg)  SpO2: 94%   Body mass index is 44.07 kg/(m^2). General: The patient appears their stated age.   HEENT:  No gross abnormalities Pulmonary: Respirations are non-labored Musculoskeletal: There are no major deformities.   Neurologic: No focal weakness or paresthesias are detected, Skin: There are no ulcer or rashes noted. Psychiatric: The patient has normal affect. Cardiovascular: There is a regular rate and rhythm without significant murmur appreciated.  Palpable right radial pulse  Diagnostic Studies: I have reviewed his arterial duplex studies which show triphasic waveforms. Vein mapping was reviewed.  He has an excellent cephalic vein with all diameters greater than 0.5 cm.    Assessment:  End-stage renal disease Plan: The patient will be scheduled for a right radiocephalic fistula.  The risks and benefits of the procedure were discussed with the patient including the risk of non-maturity, the need for future interventions, and the risk of steal syndrome.  He would like to get this done as soon as possible.  I will try to have this scheduled for Friday, May 20.     Eldridge Abrahams, M.D. [facility] Office: [phone] Pager:  [phone]

## 2015-02-25 NOTE — Discharge Instructions (Signed)
° ° °  02/25/2015 Robert [name] [id number] August 05, 1961  Surgeon(s): Serafina Mitchell, MD  Procedure(s): INSERTION OF RIGHT INTERNAL JUGULAR DIALYSIS CATHETER RIGHT ARTERIOVENOUS (AV) FISTULA CREATION  x Do not stick graft for 12 weeks

## 2015-02-28 ENCOUNTER — Encounter (HOSPITAL_COMMUNITY): Payer: Self-pay | Admitting: Surgery

## 2015-02-28 ENCOUNTER — Ambulatory Visit (INDEPENDENT_AMBULATORY_CARE_PROVIDER_SITE_OTHER): Payer: BLUE CROSS/BLUE SHIELD | Admitting: Endocrinology

## 2015-02-28 ENCOUNTER — Other Ambulatory Visit: Payer: BLUE CROSS/BLUE SHIELD

## 2015-02-28 ENCOUNTER — Telehealth: Payer: Self-pay | Admitting: Surgery

## 2015-02-28 VITALS — BP 122/68 | HR 84 | Temp 98.6°F | Ht 72.0 in | Wt 324.4 lb

## 2015-02-28 DIAGNOSIS — E039 Hypothyroidism, unspecified: Secondary | ICD-10-CM

## 2015-02-28 DIAGNOSIS — E1165 Type 2 diabetes mellitus with hyperglycemia: Secondary | ICD-10-CM

## 2015-02-28 DIAGNOSIS — IMO0002 Reserved for concepts with insufficient information to code with codable children: Secondary | ICD-10-CM

## 2015-02-28 MED ORDER — INSULIN GLARGINE 100 UNIT/ML SOLOSTAR PEN: 30.0000 [IU] | PEN_INJECTOR | SUBCUTANEOUS | Status: DC

## 2015-02-28 NOTE — Telephone Encounter (Addendum)
-----   Message from Denman George, RN sent at 02/25/2015 10:36 AM EDT ----- Regarding: Zigmund Daniel log; also needs 6 wk f/u with access duplex and appt. with [facility]   ----- Message -----    From: Gabriel Earing, PA-C    Sent: 02/25/2015   9:49 AM      To: [facility] Charge Pool  S/p right RC AVF and diatek cath placement 02/25/15.  F/u with Dr. Trula Slade in 6 weeks with duplex.  Thanks, Aldona Bar  02/28/15: spoke with pt to schedule, dpm

## 2015-02-28 NOTE — Progress Notes (Signed)
Patient ID: Robert [name], male   DOB: Aug 25, 1961, 54 y.o.   MRN: [id number]   Reason for Appointment: Type II Diabetes follow-up   History of Present Illness   Diagnosis date:  2012  Previous history: His blood sugar has been difficult to control since onset. Initially was taking oral hypoglycemic drugs like glyburide but could not take metformin because of renal dysfunction. Since Victoza did not help his control he was started on Lantus and then U 500 insulin also His A1c has previously ranged from 7.8-9.8, the lowest level in 03/2013 He was on Victoza but because of his markedly decreased appetite and weight loss this was stopped in 9/15  Recent history:   Insulin regimen: LANTUS 30 units In a.m. now. U-500 insulin: 15 units 2 x a day, 30 min ac breakfast and lunch  Was on peritoneal dialysis and now is starting hemodialysis With this he was requiring very large amounts of insulin, had gone up to 60 units a day of the U-500 insulin  With switching over the last few days to hemodialysis he appears to be getting her relatively lower blood sugars but has not checked sugars in the last 3-4 days except when he felt low Also because of not getting consistent dialysis in the last few days his appetite has been significantly decreased His A1c is usually lower than expected for his blood sugars Fructosamine is moderately increased and relatively higher than before  A1c is lower than expected for his blood sugars probably related to his anemia and renal insufficiency Current blood sugar patterns and problems identified:  Fasting blood sugars are relatively higher but variable over the last month.  Previously were nearly normal and he was taking some U-500 insulin during the night also to bring the morning sugars down  Instead of taking insulin before breakfast and supper 80s taking the mealtime dose currently at breakfast and lunch  His blood sugars after supper appeared to be high but he is  checking them very infrequently  Since stopping peritoneal dialysis he has started cutting back on the mealtime insulin but not the Lantus even though he had a low sugar on Thursday night  Recently he has had a low sugar of 57 today in the afternoon and also at 3 AM early Friday morning  His weight appears to be higher but he thinks this is from fluid retention  Proper timing of medications in relation to meals: Yes.          Monitors blood glucose: Once a day or less.    Glucometer: One Touch.          Blood Glucose readings  PRE-MEAL Breakfast Lunch  3-4 PM  Bedtime Overall  Glucose range:  61-220    57-164   187-259    Mean/median:  140    106    128    Mean values apply above for all meters except median for One Touch    Meals: 3 meals per day. 8 am and Noon and Dinner 7 p,m. Currently on the low phosphorus diet and has fewer carbohydrates        Physical activity: exercise: none           Weight control:  Wt Readings from Last 3 Encounters:  02/28/15 324 lb 6 oz (147.136 kg)  02/25/15 316 lb (143.337 kg)  02/18/15 325 lb (147.419 kg)          Complications:   nephropathy, neuropathy   Diabetes labs:  Lab Results  Component Value Date   HGBA1C 6.0 09/20/2014   HGBA1C 6.1* 08/09/2014   HGBA1C 7.0* 03/31/2014   Lab Results  Component Value Date   MICROALBUR 224.5 Repeated and verified X2.* 01/04/2014   LDLCALC 34 09/20/2014   CREATININE >18.00* 02/25/2015       Medication List       This list is accurate as of: 02/28/15 11:59 PM.  Always use your most recent med list.               aspirin 81 MG tablet  Take 81 mg by mouth every morning.     BYSTOLIC 10 MG tablet  Generic drug:  nebivolol  Take 10 mg by mouth every morning.     cetirizine 10 MG tablet  Commonly known as:  ZYRTEC  Take 10 mg by mouth every morning.     CRESTOR 20 MG tablet  Generic drug:  rosuvastatin  TAKE 1 TABLET (20 MG TOTAL) BY MOUTH DAILY.     docusate sodium 100 MG  capsule  Commonly known as:  COLACE  Take 100 mg by mouth every evening.     fluocinonide cream 0.05 %  Commonly known as:  LIDEX  Apply 1 application topically daily as needed (Skin inflammation (hand, thigh, back)).     FOSRENOL 1000 MG chewable tablet  Generic drug:  lanthanum  Chew 2,000 mg by mouth 3 (three) times daily with meals.     gentamicin cream 0.1 %  Commonly known as:  GARAMYCIN  Apply 1 application topically daily. Apply weekly to peritoneal catheter     glucose blood test strip  Commonly known as:  ONETOUCH VERIO  Use as instructed to check blood sugar 6 a day dx code E11.29     HUMULIN R 500 UNIT/ML Soln injection  Generic drug:  insulin regular human CONCENTRATED  Inject 15-20 Units into the skin 3 (three) times daily as needed (high blood sugar). Uses a sliding, 90-120 use 15 units and over 120 use 20 units     Insulin Glargine 100 UNIT/ML Solostar Pen  Commonly known as:  LANTUS SOLOSTAR  Inject 30 Units into the skin every morning.     INSULIN SYRINGE 1CC/31GX5/16" 31G X 5/16" 1 ML Misc  Use to inject insulin     levothyroxine 100 MCG tablet  Commonly known as:  SYNTHROID, LEVOTHROID  TAKE 1 TABLET (100 MCG TOTAL) BY MOUTH DAILY.     NOVOFINE 32G X 6 MM Misc  Generic drug:  Insulin Pen Needle  USE 4 PEN NEEDLES PER DAY TO INJECT INSULIN     OVER THE COUNTER MEDICATION  Take 1 tablet by mouth daily. Renovite     oxyCODONE 5 MG immediate release tablet  Commonly known as:  ROXICODONE  Take 1 tablet (5 mg total) by mouth every 6 (six) hours as needed.     polyethylene glycol packet  Commonly known as:  MIRALAX / GLYCOLAX  Take 17 g by mouth daily.     senna 8.6 MG tablet  Commonly known as:  SENOKOT  Take 1 tablet by mouth every evening.        Allergies: No Known Allergies  Past Medical History  Diagnosis Date  . Diabetes mellitus without complication   . Hypertension   . Thyroid disease   . Sleep apnea 06/18/11    split-night sleep  study- [facility].  . Hyperlipidemia   . PAT (paroxysmal atrial tachycardia)   .  PSVT (paroxysmal supraventricular tachycardia)   . Blood transfusion without reported diagnosis   . Chronic kidney disease     Stage IV  . CPAP (continuous positive airway pressure) dependence   . Dysrhythmia   . Shortness of breath   . Hypothyroidism   . GERD (gastroesophageal reflux disease)     n/v a lot  . Headache(784.0)     slight dull h/a due kidney failure  . Cancer     skin ca right shoulder  . Anemia     low hgb at present  . Umbilical hernia     will repair with thi surgery  . Neuropathy     right hand and both feet  . Constipation   . Macular degeneration   . Eczema     Past Surgical History  Procedure Laterality Date  . Nephrectomy Left 1974  . Renal biopsy Right 2012  . Skin cancer excision      right shoulder  . Capd insertion N/A 05/18/2014    Procedure: LAPAROSCOPIC INSERTION CONTINUOUS AMBULATORY PERITONEAL DIALYSIS  (CAPD) CATHETER;  Surgeon: Ralene Ok, MD;  Location: [facility];  Service: General;  Laterality: N/A;  . Umbilical hernia repair N/A 05/18/2014    Procedure: HERNIA REPAIR UMBILICAL ADULT;  Surgeon: Ralene Ok, MD;  Location: [facility];  Service: General;  Laterality: N/A;  . Laparoscopic repositioning capd catheter N/A 06/16/2014    Procedure: LAPAROSCOPIC REPOSITIONING CAPD CATHETER;  Surgeon: Ralene Ok, MD;  Location: [facility];  Service: General;  Laterality: N/A;  . Ij catheter insertion    . Colonoscopy    . Insertion of dialysis catheter Right 02/25/2015    Procedure: INSERTION OF RIGHT INTERNAL JUGULAR DIALYSIS CATHETER;  Surgeon: Serafina Mitchell, MD;  Location: [facility];  Service: Vascular;  Laterality: Right;  . Av fistula placement Right 02/25/2015    Procedure: RIGHT ARTERIOVENOUS (AV) FISTULA CREATION;  Surgeon: Serafina Mitchell, MD;  Location: [facility];  Service: Vascular;  Laterality: Right;    Family History  Problem Relation  Age of Onset  . Colon polyps Father   . Colon cancer Neg Hx   . Stomach cancer Neg Hx     Social History:  reports that he quit smoking about 14 months ago. His smoking use included Pipe. He has never used smokeless tobacco. He reports that he drinks about 0.6 oz of alcohol per week. He reports that he does not use illicit drugs.  Review of Systems:  Hypertension:  BP is now controlled  His blood pressure has been managed by his cardiologist   Lipids: He has much better levels recently Was switched from Lipitor to Crestor because of relatively high LDL levels but LDL has been trending lower.  Now taking Crestor qod    Lab Results  Component Value Date   CHOL 87 09/20/2014   CHOL 113 08/09/2014   CHOL 97 03/31/2014   Lab Results  Component Value Date   HDL 36.20* 09/20/2014   HDL 40 08/09/2014   HDL 32.60* 03/31/2014   Lab Results  Component Value Date   LDLCALC 34 09/20/2014   LDLCALC 55 08/09/2014   LDLCALC 20 03/31/2014   Lab Results  Component Value Date   TRIG 83.0 09/20/2014   TRIG 89 08/09/2014   TRIG 221.0* 03/31/2014   Lab Results  Component Value Date   CHOLHDL 2 09/20/2014   CHOLHDL 2.8 08/09/2014   CHOLHDL 3 03/31/2014   No results found for: LDLDIRECT   Thyroid: He has  had long-standing hypothyroidism   TSH is normal. 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    ASSESSMENT/ PLAN:    Diabetes type 2:  See history of present illness for detailed discussion of his current management, problems identified and sugar patterns lately His blood sugars have been relatively higher as judged by his fructosamine This has been because of his difficulty getting enough insulin to cover his glucose load from the peritoneal dialysis fluid including overnight Blood sugars overall have been higher in the morning than in the afternoon but he has not checked readings after meals as directed Appears to have high readings after supper but has only 3 readings lately  Since he is going to hemodialysis instead of peritoneal dialysis his control should be much easier Discussed that he will need to readjust his insulin further Discussed actions of U-500 insulin as a mealtime insulin and with long duration of action he should spread the doses at least by 8 hours Also discussed that since he is eating less he will need somewhat lower doses especially in the mornings Also discussed the availability of the U-500  insulin pen and he can try this on his next prescription, co-pay card given  Since his FASTING blood sugars are probably going to improve with not using peritoneal dialysis overnight he will need less Lantus Discussed adjusting Lantus every 3-4 days based on fasting blood sugar trend Detailed instructions given for his insulin doses and monitoring He does also need to be starting some exercise when he feels better   A1c is falsely low at 6.0 because of his renal dysfunction and anemia but will need to recheck this as it is required by his transplant team  Again discussed timing of glucose monitoring including after meals as also  blood sugar targets  History of hypogonadism:  His free testosterone level is again normal and does not require  treatment  HYPOTHYROIDISM: We will need follow-up TSH   Patient Instructions  U-500 TAKE 12 UNITS IN AM AND NEXT dose 18 at supper; may ADJUST PM DOSE based on bedtime sugar  ALSO add 6-8 at lunch if afternoon sugar goes up  LANTUS 20 UNITS and go up 2-4 units only if am sugar stays> 140  Check blood sugars on waking up .Marland Kitchen3-4  .Marland Kitchen times a week Also check blood sugars about 2 hours after a meal and do this after different meals by rotation Recommended blood sugar levels on waking up is 90-130 and about 2 hours after meal is 140-180 Please bring blood sugar monitor to each visit.    Counseling time on subjects discussed above is over 50% of today's 25 minute visit   Wanda Rideout 03/01/2015, 10:48 AM

## 2015-02-28 NOTE — Progress Notes (Signed)
Pre visit review using our clinic review tool, if applicable. No additional management support is needed unless otherwise documented below in the visit note. 

## 2015-02-28 NOTE — Telephone Encounter (Signed)
Paperwork had already been completed and submitted unfortunately PCP has not responded

## 2015-02-28 NOTE — Patient Instructions (Signed)
U-500 TAKE 12 UNITS IN AM AND NEXT dose 18 at supper; may ADJUST PM DOSE based on bedtime sugar  ALSO add 6-8 at lunch if afternoon sugar goes up  LANTUS 20 UNITS and go up 2-4 units only if am sugar stays> 140  Check blood sugars on waking up .Marland Kitchen3-4  .Marland Kitchen times a week Also check blood sugars about 2 hours after a meal and do this after different meals by rotation Recommended blood sugar levels on waking up is 90-130 and about 2 hours after meal is 140-180 Please bring blood sugar monitor to each visit.

## 2015-03-01 ENCOUNTER — Encounter: Payer: Self-pay | Admitting: Endocrinology

## 2015-03-02 ENCOUNTER — Other Ambulatory Visit: Payer: Self-pay | Admitting: *Deleted

## 2015-03-02 ENCOUNTER — Other Ambulatory Visit: Payer: Self-pay | Admitting: Endocrinology

## 2015-03-02 MED ORDER — INSULIN GLARGINE 100 UNIT/ML SOLOSTAR PEN: 30.0000 [IU] | PEN_INJECTOR | SUBCUTANEOUS | Status: DC

## 2015-03-17 ENCOUNTER — Ambulatory Visit (INDEPENDENT_AMBULATORY_CARE_PROVIDER_SITE_OTHER): Payer: BLUE CROSS/BLUE SHIELD | Admitting: Psychiatry

## 2015-03-17 DIAGNOSIS — F0630 Mood disorder due to known physiological condition, unspecified: Secondary | ICD-10-CM

## 2015-03-30 DIAGNOSIS — N189 Chronic kidney disease, unspecified: Secondary | ICD-10-CM | POA: Diagnosis present

## 2015-03-30 DIAGNOSIS — D631 Anemia in chronic kidney disease: Secondary | ICD-10-CM | POA: Diagnosis present

## 2015-03-30 DIAGNOSIS — Z794 Long term (current) use of insulin: Secondary | ICD-10-CM | POA: Insufficient documentation

## 2015-03-30 HISTORY — DX: Anemia in chronic kidney disease: D63.1

## 2015-03-30 HISTORY — DX: Anemia in chronic kidney disease: N18.9

## 2015-04-01 ENCOUNTER — Ambulatory Visit: Payer: Medicare Other | Admitting: *Deleted

## 2015-04-01 DIAGNOSIS — E1149 Type 2 diabetes mellitus with other diabetic neurological complication: Secondary | ICD-10-CM

## 2015-04-04 ENCOUNTER — Other Ambulatory Visit: Payer: Self-pay | Admitting: Endocrinology

## 2015-04-04 ENCOUNTER — Other Ambulatory Visit: Payer: Self-pay

## 2015-04-04 NOTE — Progress Notes (Signed)
Patient ID: Robert [name], male   DOB: [birth year], 54 y.o.   MRN: [id number] Patient presents for diabetic shoe measurement and scan.  Will call when shoes and inserts arrive.

## 2015-04-05 ENCOUNTER — Other Ambulatory Visit: Payer: Self-pay | Admitting: Surgery

## 2015-04-05 MED ORDER — INSULIN REGULAR HUMAN (CONC) 500 UNIT/ML ~~LOC~~ SOLN: SUBCUTANEOUS | Status: DC

## 2015-04-18 ENCOUNTER — Encounter (HOSPITAL_COMMUNITY): Payer: Self-pay

## 2015-04-18 ENCOUNTER — Encounter: Payer: Self-pay | Admitting: Surgery

## 2015-04-21 ENCOUNTER — Ambulatory Visit (INDEPENDENT_AMBULATORY_CARE_PROVIDER_SITE_OTHER): Payer: BLUE CROSS/BLUE SHIELD | Admitting: Psychiatry

## 2015-04-21 ENCOUNTER — Encounter: Payer: Self-pay | Admitting: Surgery

## 2015-04-21 DIAGNOSIS — F0630 Mood disorder due to known physiological condition, unspecified: Secondary | ICD-10-CM

## 2015-04-22 ENCOUNTER — Encounter (HOSPITAL_COMMUNITY): Payer: Self-pay

## 2015-04-22 ENCOUNTER — Encounter (HOSPITAL_COMMUNITY)
Admission: RE | Admit: 2015-04-22 | Discharge: 2015-04-22 | Disposition: A | Discharge status: Home / Self Care | Payer: BLUE CROSS/BLUE SHIELD | Source: Ambulatory Visit | Attending: Surgery | Admitting: Surgery

## 2015-04-22 DIAGNOSIS — Z48812 Encounter for surgical aftercare following surgery on the circulatory system: Secondary | ICD-10-CM | POA: Diagnosis not present

## 2015-04-22 DIAGNOSIS — N186 End stage renal disease: Secondary | ICD-10-CM | POA: Diagnosis not present

## 2015-04-22 DIAGNOSIS — Z01812 Encounter for preprocedural laboratory examination: Secondary | ICD-10-CM | POA: Diagnosis present

## 2015-04-22 LAB — CBC
HCT: 33.9 % — ABNORMAL LOW (ref 39.0–52.0)
Hemoglobin: 11.2 g/dL — ABNORMAL LOW (ref 13.0–17.0)
MCH: 32.7 pg (ref 26.0–34.0)
MCHC: 33 g/dL (ref 30.0–36.0)
MCV: 98.8 fL (ref 78.0–100.0)
Platelets: 301 10*3/uL (ref 150–400)
RBC: 3.43 MIL/uL — AB (ref 4.22–5.81)
RDW: 14.7 % (ref 11.5–15.5)
WBC: 8.6 10*3/uL (ref 4.0–10.5)

## 2015-04-22 LAB — BASIC METABOLIC PANEL
ANION GAP: 17 — AB (ref 5–15)
BUN: 59 mg/dL — ABNORMAL HIGH (ref 6–20)
CO2: 23 mmol/L (ref 22–32)
Calcium: 9.9 mg/dL (ref 8.9–10.3)
Chloride: 97 mmol/L — ABNORMAL LOW (ref 101–111)
Creatinine, Ser: 11.4 mg/dL — ABNORMAL HIGH (ref 0.61–1.24)
GFR calc Af Amer: 5 mL/min — ABNORMAL LOW (ref >60)
GFR, EST NON AFRICAN AMERICAN: 4 mL/min — AB (ref >60)
Glucose, Bld: 62 mg/dL — ABNORMAL LOW (ref 65–99)
Potassium: 6 mmol/L — ABNORMAL HIGH (ref 3.5–5.1)
Sodium: 137 mmol/L (ref 135–145)

## 2015-04-22 LAB — GLUCOSE, CAPILLARY: GLUCOSE-CAPILLARY: 83 mg/dL (ref 65–99)

## 2015-04-22 NOTE — Progress Notes (Signed)
Pt. Informed to hold aspirin for 5 days prior to surgery.  Pt. Reports that he has been told by Dr. Jimmy Footman to hold  Crestor for several days due to leg pain.  In just 2 days he reports that he is feeling better.

## 2015-04-22 NOTE — Progress Notes (Signed)
Anesthesia Chart Review: Patient is a 54 year old male scheduled for UHR, minor removal of PD catheter on 04/28/15 by Dr. Ninfa Linden.  History includes ESRD s/p PD catheter 05/18/14 and s/p right IJ HD catheter 02/25/15 (HD MWF), OSA with Bi-Pap, former smoker, DM on insulin, paroxsymal atrial tachycardia/PSVT, HLD, hypothyroidism, GERD, anemia, macular degeneration, HTN, skin cancer, left nephrectomy age 46 ("congenital nonfunctioning kidney" after a football injury).  BMI is consistent with morbid obesity. PCP is listed as Dr. Domenick Gong. Endocrinologist is Dr. Elayne Snare. Nephrologist is Dr. Moshe Cipro.  He has had an initial transplant evaluation at [facility].  Meds include ASA, Bystolic, Phoslo, Zyrtec, Crestor, Fosrenol, Lantus, Humulin R, levothyroxine, nebivolol,.  02/25/15 EKG: NSR.  09/10/14 Nuclear stress test: Overall Impression: Normal stress nuclear study. LV Wall Motion: NL LV Function; NL Wall Motion. LVEF 62%.  08/27/14 Dobutamine stress echo [facility]): STRESS ECG RESULTS -----------------------------------------------------------  ECG Results: Nondiagnostic due to inadequate heart rate  INTERPRETATION ---------------------------------------------------------------  INDETERMINATE.  NO DOPPLER PERFORMED FOR VALVULAR REGURGITATION  NO VALVULAR STENOSIS  Note: DOBUTAMINE INFUSION STOPPED AFTER HYPOTENSIVE RESPONSE AND LV CAVITY  OBLITERATION.  NO EVIDENCE OF ISCHEMIA  NORMAL RESTING BP - HYPOTENSIVE RESPONSE   08/27/14 Echo [facility]): INTERPRETATION --------------------------------------------------------------- NORMAL LEFT VENTRICULAR SYSTOLIC FUNCTION WITH MODERATE LVH NORMAL RIGHT VENTRICULAR SYSTOLIC FUNCTION VALVULAR REGURGITATION: TRIVIAL MR, TRIVIAL PR, TRIVIAL TR NO VALVULAR STENOSIS NO PRIOR STUDY FOR COMPARISON  02/25/15 1V CXR: IMPRESSION: Status post dialysis catheter placement in satisfactory position. No pneumothorax is noted.  Preoperative labs noted. He  will get an ISTAT4 on arrival.  He is on hemodialysis three times/week. A1C is still pending.  If no acute changes and ISTAT results acceptable then I anticipate that he can proceed as planned.  [facility] Short Stay Center/Anesthesiology Phone [phone] 04/22/2015 6:26 PM

## 2015-04-22 NOTE — Pre-Procedure Instructions (Signed)
Robert [name]  04/22/2015      [id number] - [place], [place] - [address] [name] [address] Phone: [phone] Fax: [phone]    Your procedure is scheduled on 04/28/2015  Report to [facility] Admitting at 9:30 A.M.  Call this number if you have problems the morning of surgery:  [phone]   Remember:  Do not eat food or drink liquids after midnight. On WEDNESDAY  Take these medicines the morning of surgery with A SIP OF WATER Bystolic , Levothyroxine, Zrytec   Do not wear jewelry  Do not wear lotions, powders, or perfumes.  You may wear deodorant.   Men may shave face and neck.  Do not bring valuables to the hospital.  [facility] is not responsible for any belongings or valuables.  Contacts, dentures or bridgework may not be worn into surgery.  Leave your suitcase in the car.  After surgery it may be brought to your room.  For patients admitted to the hospital, discharge time will be determined by your treatment team.  Patients discharged the day of surgery will not be allowed to drive home.   Name and phone number of your driver:   /with wife Special instructions:  Special Instructions: [facility] - Preparing for Surgery  Before surgery, you can play an important role.  Because skin is not sterile, your skin needs to be as free of germs as possible.  You can reduce the number of germs on you skin by washing with CHG (chlorahexidine gluconate) soap before surgery.  CHG is an antiseptic cleaner which kills germs and bonds with the skin to continue killing germs even after washing.  Please DO NOT use if you have an allergy to CHG or antibacterial soaps.  If your skin becomes reddened/irritated stop using the CHG and inform your nurse when you arrive at Short Stay.  Do not shave (including legs and underarms) for at least 48 hours prior to the first CHG shower.  You may shave your  face.  Please follow these instructions carefully:   1.  Shower with CHG Soap the night before surgery and the  morning of Surgery.  2.  If you choose to wash your hair, wash your hair first as usual with your  normal shampoo.  3.  After you shampoo, rinse your hair and body thoroughly to remove the  Shampoo.  4.  Use CHG as you would any other liquid soap.  You can apply chg directly to the skin and wash gently with scrungie or a clean washcloth.  5.  Apply the CHG Soap to your body ONLY FROM THE NECK DOWN.    Do not use on open wounds or open sores.  Avoid contact with your eyes, ears, mouth and genitals (private parts).  Wash genitals (private parts)   with your normal soap.  6.  Wash thoroughly, paying special attention to the area where your surgery will be performed.  7.  Thoroughly rinse your body with warm water from the neck down.  8.  DO NOT shower/wash with your normal soap after using and rinsing off   the CHG Soap.  9.  Pat yourself dry with a clean towel.            10.  Wear clean pajamas.            11.  Place clean sheets on your bed the  night of your first shower and do not sleep with pets.  Day of Surgery  Do not apply any lotions/deodorants the morning of surgery.  Please wear clean clothes to the hospital/surgery center.  Please read over the following fact sheets that you were given. Pain Booklet, Coughing and Deep Breathing and Surgical Site Infection Prevention

## 2015-04-23 LAB — HEMOGLOBIN A1C
Hgb A1c MFr Bld: 6 % — ABNORMAL HIGH (ref 4.8–5.6)
Mean Plasma Glucose: 126 mg/dL

## 2015-04-25 ENCOUNTER — Ambulatory Visit (HOSPITAL_COMMUNITY)
Admission: RE | Admit: 2015-04-25 | Discharge: 2015-04-25 | Disposition: A | Discharge status: Home / Self Care | Payer: BLUE CROSS/BLUE SHIELD | Source: Ambulatory Visit | Attending: Surgery | Admitting: Surgery

## 2015-04-25 ENCOUNTER — Ambulatory Visit (INDEPENDENT_AMBULATORY_CARE_PROVIDER_SITE_OTHER): Payer: Self-pay | Admitting: Surgery

## 2015-04-25 ENCOUNTER — Inpatient Hospital Stay (HOSPITAL_COMMUNITY): Admission: RE | Admit: 2015-04-25 | Payer: Self-pay | Source: Ambulatory Visit

## 2015-04-25 ENCOUNTER — Encounter: Payer: Self-pay | Admitting: Surgery

## 2015-04-25 VITALS — BP 160/90 | HR 72 | Temp 98.2°F | Resp 16 | Ht 72.0 in | Wt 317.0 lb

## 2015-04-25 DIAGNOSIS — Z01812 Encounter for preprocedural laboratory examination: Secondary | ICD-10-CM | POA: Diagnosis not present

## 2015-04-25 DIAGNOSIS — N186 End stage renal disease: Secondary | ICD-10-CM

## 2015-04-25 DIAGNOSIS — Z48812 Encounter for surgical aftercare following surgery on the circulatory system: Secondary | ICD-10-CM | POA: Insufficient documentation

## 2015-04-25 NOTE — Progress Notes (Signed)
Patient name: Robert [name] MRN: [id number] DOB: 1960-11-01 Sex: male     Chief Complaint  Patient presents with  . Routine [name]    Right forearm AVF and Right chest TDC on 02-25-15.  Per pt says Dr. Jimmy Footman thinks AVF is too deep. Pt is to have PD cath removed and Umbilical herniorrhaphy next week By Dr. Rush Farmer.     HISTORY OF PRESENT ILLNESS:  the patient is back for follow-up. On 02/25/2015 he underwent right radiocephalic fistula and placement of a dietary catheter.  He is scheduled to have his umbilical hernia repaired and peritoneal dialysis catheter removed in the next week.   The patient denies any symptoms of steal.  Past Medical History  Diagnosis Date  . Diabetes mellitus without complication   . Thyroid disease   . Hyperlipidemia   . PAT (paroxysmal atrial tachycardia)   . PSVT (paroxysmal supraventricular tachycardia)   . Blood transfusion without reported diagnosis 1974    kidney removed - L  . CPAP (continuous positive airway pressure) dependence   . Dysrhythmia   . Shortness of breath   . Hypothyroidism   . GERD (gastroesophageal reflux disease)     n/v a lot  . Headache(784.0)     slight dull h/a due kidney failure  . Anemia     low hgb at present  . Umbilical hernia     will repair with thi surgery  . Neuropathy     right hand and both feet  . Constipation   . Macular degeneration   . Eczema   . Sleep apnea 06/18/11    split-night sleep study- [facility]., BiPAP- no oxygen  . Chronic kidney disease     Stage IV, Richarda Blade. M,W,F  . Cancer     skin ca right shoulder, plastic dsyplasia, pre-Ca polpys removed on Colonoscopy- 07/2014  . Hypertension     SVT h/o , followed by Dr. Claiborne Billings    Past Surgical History  Procedure Laterality Date  . Nephrectomy Left 1974  . Renal biopsy Right 2012  . Skin cancer excision      right shoulder  . Capd insertion N/A 05/18/2014    Procedure: LAPAROSCOPIC INSERTION CONTINUOUS  AMBULATORY PERITONEAL DIALYSIS  (CAPD) CATHETER;  Surgeon: Ralene Ok, MD;  Location: [place];  Service: General;  Laterality: N/A;  . Umbilical hernia repair N/A 05/18/2014    Procedure: HERNIA REPAIR UMBILICAL ADULT;  Surgeon: Ralene Ok, MD;  Location: [place];  Service: General;  Laterality: N/A;  . Laparoscopic repositioning capd catheter N/A 06/16/2014    Procedure: LAPAROSCOPIC REPOSITIONING CAPD CATHETER;  Surgeon: Ralene Ok, MD;  Location: [place];  Service: General;  Laterality: N/A;  . Ij catheter insertion    . Colonoscopy    . Insertion of dialysis catheter Right 02/25/2015    Procedure: INSERTION OF RIGHT INTERNAL JUGULAR DIALYSIS CATHETER;  Surgeon: Serafina Mitchell, MD;  Location: [place];  Service: Vascular;  Laterality: Right;  . Av fistula placement Right 02/25/2015    Procedure: RIGHT ARTERIOVENOUS (AV) FISTULA CREATION;  Surgeon: Serafina Mitchell, MD;  Location: [place];  Service: Vascular;  Laterality: Right;    History   Social History  . Marital Status: Married    Spouse Name: N/A  . Number of Children: 3  . 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 Smoking status: Former Smoker  Types: Pipe    Quit date: 12/06/2013  . Smokeless tobacco: Never Used  . Alcohol Use: 0.6 oz/week    1 Glasses of wine per week     Comment: 1 glass of wine per week, rare use as of 01/2015  . Drug Use: No  . Sexual Activity: Not on file   Other Topics Concern  . Not on file   Social History Narrative    Family History  Problem Relation Age of Onset  . Colon polyps Father   . Colon cancer Neg Hx   . Stomach cancer Neg Hx     Allergies as of 04/25/2015  . (No Known Allergies)    Current Outpatient Prescriptions on File Prior to Visit  Medication Sig Dispense Refill  . aspirin 81 MG tablet Take 81 mg by mouth every morning.     Marland Kitchen BYSTOLIC 5 MG tablet Take 5 mg by mouth daily.  3  . CALCITRIOL PO Take 1 tablet by  mouth 3 (three) times a week.    . calcium acetate (PHOSLO) 667 MG capsule Take 667 mg by mouth 3 (three) times daily with meals.    . cetirizine (ZYRTEC) 10 MG tablet Take 10 mg by mouth every morning.     . docusate sodium (COLACE) 100 MG capsule Take 100 mg by mouth every evening.     . fluocinonide cream (LIDEX) [id number] % Apply 1 application topically daily as needed (Skin inflammation (hand, thigh, back)).     [name] 1000 MG chewable tablet Chew 1,000-2,000 mg by mouth 4 (four) times daily. 2000 mg with every meal and 1000 mg with snack    . gentamicin cream (GARAMYCIN) 0.1 % Apply 1 application topically daily. Apply weekly to peritoneal catheter    . glucose blood (ONETOUCH VERIO) test strip Use as instructed to check blood sugar 6 a day dx code E11.29 550 each 3  . Insulin Glargine (LANTUS SOLOSTAR) 100 UNIT/ML Solostar Pen Inject 30 Units into the skin every morning. 45 mL 1  . insulin regular human CONCENTRATED (HUMULIN R) 500 UNIT/ML injection Inject 12 units in the am and 18 units at supper 60 mL 1  . Insulin Syringe-Needle U-100 (INSULIN SYRINGE 1CC/31GX5/16") 31G X 5/16" 1 ML MISC Use to inject insulin 100 each 3  . levothyroxine (SYNTHROID, LEVOTHROID) 100 MCG tablet TAKE 1 TABLET (100 MCG TOTAL) BY MOUTH DAILY. 90 tablet 1  . nebivolol (BYSTOLIC) 10 MG tablet Take 10 mg by mouth daily.    Marland Kitchen NOVOFINE 32G X 6 MM MISC USE 4 PEN NEEDLES PER DAY TO INJECT INSULIN 400 each 1  . OVER THE COUNTER MEDICATION Take 1 tablet by mouth daily. OTC DiaVite    . polyethylene glycol (MIRALAX / GLYCOLAX) packet Take 17 g by mouth daily.    . CRESTOR 20 MG tablet TAKE 1 TABLET (20 MG TOTAL) BY MOUTH DAILY. (Patient not taking: Reported on 04/25/2015) 90 tablet 1   No current facility-administered medications on file prior to visit.     REVIEW OF SYSTEMS: Cardiovascular: No chest pain, chest pressure, palpitations, orthopnea, or dyspnea on exertion. No claudication or rest pain,  No history of DVT  or phlebitis. Pulmonary: No productive cough, asthma or wheezing. Neurologic: No weakness, paresthesias, aphasia, or amaurosis. No dizziness. Hematologic: No bleeding problems or clotting disorders. Musculoskeletal: No joint pain or joint swelling. Gastrointestinal: No blood in stool or hematemesis Genitourinary: No dysuria or hematuria. Psychiatric:: No history of major depression. Integumentary: No rashes or  ulcers. Constitutional: No fever or chills.  PHYSICAL EXAMINATION:   Vital signs are  Filed Vitals:   04/25/15 1312  BP: 160/90  Pulse: 72  Temp: 98.2 F (36.8 C)  TempSrc: Oral  Resp: 16  Height: 6' (1.829 m)  Weight: 317 lb (143.79 kg)  SpO2: 97%   Body mass index is 42.98 kg/(m^2). General: The patient appears their stated age. HEENT:  No gross abnormalities Pulmonary:  Non labored breathing Abdomen:  Umbilical hernia Musculoskeletal: There are no major deformities. Neurologic: No focal weakness or paresthesias are detected, Skin: There are no ulcer or rashes noted. Psychiatric: The patient has normal affect. Cardiovascular:  Palpable thrill within the right radiocephalic fistula.  This decreases as you go up the arm  Diagnostic Studies  I have reviewed the patient's duplex today.  The fistula is patent. Diameter measurements near the anastomosis are somewhat decreased ranging from 0.39-0.45 cm. There are 2 branches in the arm. As you go  Higher in the arm, the vein diameters are more appropriate in the 0.6 cm range.  The fistula is also slightly deep.  Assessment:  non-maturing right radiocephalic fistula Plan:  the etiology for the non-maturation is multifactorial. There is mild narrowing near the arterial venous anastomosis and proximal fistula.  There are 2 branches as well below the antecubital crease. In addition the fistula is on the deep side. Therefore, I have recommended that the patient undergo elevation of his fistula and branch ligation.  I will also  try to pass a dilator across the arteriovenous anastomosis to improve the proximal portion of the fistula. This will be scheduled for Thursday August 18th  Eldridge Abrahams, M.D. [facility] Office: [phone] Pager:  [phone]

## 2015-04-27 MED ORDER — CEFAZOLIN SODIUM 10 G IJ SOLR
3.0000 g | INTRAMUSCULAR | Status: AC
  Administered 2015-04-28: 3 g via INTRAVENOUS
  Filled 2015-04-27: qty 3000

## 2015-04-27 NOTE — H&P (Signed)
Robert [name]. Kamp 04/05/2015 10:01 AM Location: [facility] Patient #: [phone] DOB: Aug 04, 1961 Married / Language: English / Race: White Male  History of Present Illness (Drucella Karbowski A. Ninfa Linden MD; 04/05/2015 10:23 AM) Patient words: Hernia.  The patient is a 54 year old male who presents with an umbilical hernia. This is a pleasant gentleman with multiple medical problems who is status post peritoneal dialysis catheter placement as well as a small primary repair of umbilical hernia year ago by Dr. Rosendo Gros. He is now undergoing hemodialysis and peritoneal dialysis catheter is no longer working. He would like to have the catheter removed as well as his umbilical hernia repaired. He has mild discomfort at the umbilicus.   Other Problems Erasmo Leventhal, RN, BSN; 04/05/2015 10:02 AM) Cancer Chronic Renal Failure Syndrome Diabetes Mellitus High blood pressure Hypercholesterolemia Sleep Apnea Thyroid Disease Umbilical Hernia Repair  Past Surgical History Erasmo Leventhal, RN, BSN; 04/05/2015 10:02 AM) Colon Polyp Removal - Colonoscopy Dialysis Shunt / Fistula Nephrectomy Left. Shoulder Surgery Right. Splenectomy  Diagnostic Studies History Erasmo Leventhal, RN, BSN; 04/05/2015 10:02 AM) Colonoscopy within last year  Allergies Erasmo Leventhal, RN, BSN; 04/05/2015 10:03 AM) No Known Allergies06/28/2016  Medication History (Erasmo Leventhal, RN, BSN; 04/05/2015 10:05 AM) OxyCODONE HCl (5MG  Tablet, Oral as needed) Active. Bystolic (5MG  Tablet, Oral daily) Active. Bystolic (10MG  Tablet, Oral daily) Active. Crestor (20MG  Tablet, Oral daily) Active. Fluocinonide (0.05% Cream, External daily) Active. Fosrenol (1000MG  Tablet Chewable, Oral daily) Active. Furosemide (40MG  Tablet, Oral daily) Active. Gentamicin Sulfate (0.1% Cream, External daily) Active. HumuLIN R U-500 (CONCENTRATED) (500UNIT/ML Solution, Subcutaneous daily) Active. HydrALAZINE HCl  (50MG  Tablet, Oral daily) Active. Lantus SoloStar (100UNIT/ML Soln Pen-inj, Subcutaneous daily) Active. Levothyroxine Sodium (100MCG Tablet, Oral daily) Active. OneTouch Verio (In Vitro two times daily) Active. Senokot S (8.6-50MG  Tablet, Oral daily) Active. ZyrTEC Allergy (10MG  Tablet, Oral as needed) Active. Medications Reconciled  Social History Multimedia programmer, RN, BSN; 04/05/2015 10:02 AM) Alcohol use Occasional alcohol use. No drug use Tobacco use Former smoker.  Family History (Erasmo Leventhal, RN, BSN; 04/05/2015 10:02 AM) Colon Polyps Father. Thyroid problems Brother.  Review of Systems Occupational hygienist, BSN; 04/05/2015 10:02 AM) General Present- Fatigue. Not Present- Appetite Loss, Chills, Fever, Night Sweats, Weight Gain and Weight Loss. Skin Present- Dryness. Not Present- Change in Wart/Mole, Hives, Jaundice, New Lesions, Non-Healing Wounds, Rash and Ulcer. HEENT Present- Wears glasses/contact lenses. Not Present- Earache, Hearing Loss, Hoarseness, Nose Bleed, Oral Ulcers, Ringing in the Ears, Seasonal Allergies, Sinus Pain, Sore Throat, Visual Disturbances and Yellow Eyes. Respiratory Present- Snoring. Not Present- Bloody sputum, Chronic Cough, Difficulty Breathing and Wheezing. Breast Not Present- Breast Mass, Breast Pain, Nipple Discharge and Skin Changes. Cardiovascular Present- Swelling of Extremities. Not Present- Chest Pain, Difficulty Breathing Lying Down, Leg Cramps, Palpitations, Rapid Heart Rate and Shortness of Breath. Gastrointestinal Not Present- Abdominal Pain, Bloating, Bloody Stool, Change in Bowel Habits, Chronic diarrhea, Constipation, Difficulty Swallowing, Excessive gas, Gets full quickly at meals, Hemorrhoids, Indigestion, Nausea, Rectal Pain and Vomiting. Male Genitourinary Not Present- Blood in Urine, Change in Urinary Stream, Frequency, Impotence, Nocturia, Painful Urination, Urgency and Urine Leakage. Musculoskeletal Not Present- Back Pain,  Joint Pain, Joint Stiffness, Muscle Pain, Muscle Weakness and Swelling of Extremities. Neurological Not Present- Decreased Memory, Fainting, Headaches, Numbness, Seizures, Tingling, Tremor, Trouble walking and Weakness. Psychiatric Not Present- Anxiety, Bipolar, Change in Sleep Pattern, Depression, Fearful and Frequent crying. Endocrine Not Present- Cold Intolerance, Excessive Hunger, Hair Changes, Heat Intolerance, Hot flashes and New Diabetes. Hematology Not Present- Easy Bruising,  Excessive bleeding, Gland problems, HIV and Persistent Infections.   Vitals Occupational hygienist, BSN; 04/05/2015 10:03 AM) 04/05/2015 10:02 AM Weight: 317.4 lb Height: 72in Body Surface Area: 2.7 m Body Mass Index: 43.05 kg/m Pulse: 78 (Regular)  Resp.: 20 (Unlabored)  BP: 160/88 (Sitting, Left Arm, Standard)    Physical Exam (Tevyn Codd A. Ninfa Linden MD; 04/05/2015 10:23 AM) The physical exam findings are as follows: General well in appearance Lungs clear cv RRR Abdomen soft, non tender Note:On examination, there is a peritoneal dialysis catheter in the right upper quadrant as well as an easily reducible recurrent umbilical hernia which is nontender    Assessment & Plan (Trenisha Lafavor A. Ninfa Linden MD; 04/05/2015 10:24 AM) RECURRENT UMBILICAL HERNIA WITH INCARCERATION (552.1  K42.0) Impression: We will now schedule him for removal of the peritoneal dialysis catheter as it is nonfunctioning as well as umbilical hernia repair with mesh. I discussed the risks with him in detail. We will try to schedule this as an outpatient on a nondialysis day. PD CATHETER DYSFUNCTION (996.56  T85.611A)

## 2015-04-28 ENCOUNTER — Ambulatory Visit (HOSPITAL_COMMUNITY)
Admission: RE | Admit: 2015-04-28 | Discharge: 2015-04-28 | Disposition: A | Discharge status: Home / Self Care | Payer: BLUE CROSS/BLUE SHIELD | Source: Ambulatory Visit | Attending: Surgery | Admitting: Surgery

## 2015-04-28 ENCOUNTER — Ambulatory Visit (HOSPITAL_COMMUNITY): Payer: BLUE CROSS/BLUE SHIELD | Admitting: Vascular Surgery

## 2015-04-28 ENCOUNTER — Encounter (HOSPITAL_COMMUNITY)
Admission: RE | Disposition: A | Discharge status: Home / Self Care | Payer: Self-pay | Source: Ambulatory Visit | Attending: Surgery

## 2015-04-28 ENCOUNTER — Encounter (HOSPITAL_COMMUNITY): Payer: Self-pay | Admitting: Certified Registered"

## 2015-04-28 DIAGNOSIS — G4733 Obstructive sleep apnea (adult) (pediatric): Secondary | ICD-10-CM | POA: Diagnosis not present

## 2015-04-28 DIAGNOSIS — K420 Umbilical hernia with obstruction, without gangrene: Secondary | ICD-10-CM | POA: Insufficient documentation

## 2015-04-28 DIAGNOSIS — K219 Gastro-esophageal reflux disease without esophagitis: Secondary | ICD-10-CM | POA: Diagnosis not present

## 2015-04-28 DIAGNOSIS — E119 Type 2 diabetes mellitus without complications: Secondary | ICD-10-CM | POA: Insufficient documentation

## 2015-04-28 DIAGNOSIS — E780 Pure hypercholesterolemia: Secondary | ICD-10-CM | POA: Insufficient documentation

## 2015-04-28 DIAGNOSIS — N186 End stage renal disease: Secondary | ICD-10-CM | POA: Insufficient documentation

## 2015-04-28 DIAGNOSIS — Z87891 Personal history of nicotine dependence: Secondary | ICD-10-CM | POA: Insufficient documentation

## 2015-04-28 DIAGNOSIS — Z794 Long term (current) use of insulin: Secondary | ICD-10-CM | POA: Diagnosis not present

## 2015-04-28 DIAGNOSIS — Z9989 Dependence on other enabling machines and devices: Secondary | ICD-10-CM | POA: Insufficient documentation

## 2015-04-28 DIAGNOSIS — Y929 Unspecified place or not applicable: Secondary | ICD-10-CM | POA: Insufficient documentation

## 2015-04-28 DIAGNOSIS — Z905 Acquired absence of kidney: Secondary | ICD-10-CM | POA: Diagnosis not present

## 2015-04-28 DIAGNOSIS — Z79899 Other long term (current) drug therapy: Secondary | ICD-10-CM | POA: Insufficient documentation

## 2015-04-28 DIAGNOSIS — Z85828 Personal history of other malignant neoplasm of skin: Secondary | ICD-10-CM | POA: Diagnosis not present

## 2015-04-28 DIAGNOSIS — I120 Hypertensive chronic kidney disease with stage 5 chronic kidney disease or end stage renal disease: Secondary | ICD-10-CM | POA: Diagnosis not present

## 2015-04-28 DIAGNOSIS — Y841 Kidney dialysis as the cause of abnormal reaction of the patient, or of later complication, without mention of misadventure at the time of the procedure: Secondary | ICD-10-CM | POA: Diagnosis not present

## 2015-04-28 DIAGNOSIS — Z792 Long term (current) use of antibiotics: Secondary | ICD-10-CM | POA: Diagnosis not present

## 2015-04-28 DIAGNOSIS — Z7982 Long term (current) use of aspirin: Secondary | ICD-10-CM | POA: Diagnosis not present

## 2015-04-28 DIAGNOSIS — H3530 Unspecified macular degeneration: Secondary | ICD-10-CM | POA: Insufficient documentation

## 2015-04-28 DIAGNOSIS — E039 Hypothyroidism, unspecified: Secondary | ICD-10-CM | POA: Diagnosis not present

## 2015-04-28 DIAGNOSIS — Z859 Personal history of malignant neoplasm, unspecified: Secondary | ICD-10-CM | POA: Insufficient documentation

## 2015-04-28 DIAGNOSIS — T82858A Stenosis of vascular prosthetic devices, implants and grafts, initial encounter: Secondary | ICD-10-CM | POA: Insufficient documentation

## 2015-04-28 DIAGNOSIS — D649 Anemia, unspecified: Secondary | ICD-10-CM | POA: Insufficient documentation

## 2015-04-28 DIAGNOSIS — T85611A Breakdown (mechanical) of intraperitoneal dialysis catheter, initial encounter: Secondary | ICD-10-CM | POA: Insufficient documentation

## 2015-04-28 DIAGNOSIS — I480 Paroxysmal atrial fibrillation: Secondary | ICD-10-CM | POA: Diagnosis not present

## 2015-04-28 DIAGNOSIS — T82590A Other mechanical complication of surgically created arteriovenous fistula, initial encounter: Secondary | ICD-10-CM | POA: Insufficient documentation

## 2015-04-28 DIAGNOSIS — K5900 Constipation, unspecified: Secondary | ICD-10-CM | POA: Insufficient documentation

## 2015-04-28 DIAGNOSIS — Y832 Surgical operation with anastomosis, bypass or graft as the cause of abnormal reaction of the patient, or of later complication, without mention of misadventure at the time of the procedure: Secondary | ICD-10-CM | POA: Insufficient documentation

## 2015-04-28 DIAGNOSIS — Z992 Dependence on renal dialysis: Secondary | ICD-10-CM | POA: Insufficient documentation

## 2015-04-28 DIAGNOSIS — K429 Umbilical hernia without obstruction or gangrene: Secondary | ICD-10-CM | POA: Diagnosis present

## 2015-04-28 HISTORY — PX: UMBILICAL HERNIA REPAIR: SHX196

## 2015-04-28 HISTORY — PX: MINOR REMOVAL OF PERITONEAL DIALYSIS CATHETER: SHX6397

## 2015-04-28 LAB — POCT I-STAT 4, (NA,K, GLUC, HGB,HCT)
Glucose, Bld: 134 mg/dL — ABNORMAL HIGH (ref 65–99)
HCT: 31 % — ABNORMAL LOW (ref 39.0–52.0)
HEMOGLOBIN: 10.5 g/dL — AB (ref 13.0–17.0)
POTASSIUM: 5.4 mmol/L — AB (ref 3.5–5.1)
Sodium: 136 mmol/L (ref 135–145)

## 2015-04-28 LAB — GLUCOSE, CAPILLARY: Glucose-Capillary: 101 mg/dL — ABNORMAL HIGH (ref 65–99)

## 2015-04-28 SURGERY — REPAIR, HERNIA, UMBILICAL, ADULT
Anesthesia: General | Site: Abdomen

## 2015-04-28 MED ORDER — ALBUMIN HUMAN 5 % IV SOLN
INTRAVENOUS | Status: DC | PRN
  Administered 2015-04-28: 11:00:00 via INTRAVENOUS

## 2015-04-28 MED ORDER — HYDROMORPHONE HCL 1 MG/ML IJ SOLN: 0.2500 mg | INTRAMUSCULAR | Status: DC | PRN

## 2015-04-28 MED ORDER — BUPIVACAINE HCL (PF) 0.25 % IJ SOLN
INTRAMUSCULAR | Status: DC | PRN
  Administered 2015-04-28: 30 mL

## 2015-04-28 MED ORDER — FENTANYL CITRATE (PF) 250 MCG/5ML IJ SOLN
INTRAMUSCULAR | Status: DC | PRN
  Administered 2015-04-28 (×2): 25 ug via INTRAVENOUS
  Administered 2015-04-28: 50 ug via INTRAVENOUS
  Administered 2015-04-28 (×2): 25 ug via INTRAVENOUS

## 2015-04-28 MED ORDER — SODIUM CHLORIDE 0.9 % IV SOLN
INTRAVENOUS | Status: DC
  Administered 2015-04-28: 11:00:00 via INTRAVENOUS

## 2015-04-28 MED ORDER — OXYCODONE HCL 5 MG PO TABS
ORAL_TABLET | ORAL | Status: AC
  Filled 2015-04-28: qty 2

## 2015-04-28 MED ORDER — 0.9 % SODIUM CHLORIDE (POUR BTL) OPTIME
TOPICAL | Status: DC | PRN
  Administered 2015-04-28: 1000 mL

## 2015-04-28 MED ORDER — ONDANSETRON HCL 4 MG/2ML IJ SOLN
INTRAMUSCULAR | Status: DC | PRN
  Administered 2015-04-28: 4 mg via INTRAVENOUS

## 2015-04-28 MED ORDER — PROPOFOL 10 MG/ML IV BOLUS
INTRAVENOUS | Status: DC | PRN
  Administered 2015-04-28: 30 mg via INTRAVENOUS
  Administered 2015-04-28: 150 mg via INTRAVENOUS

## 2015-04-28 MED ORDER — MIDAZOLAM HCL 5 MG/5ML IJ SOLN
INTRAMUSCULAR | Status: DC | PRN
  Administered 2015-04-28: 2 mg via INTRAVENOUS

## 2015-04-28 MED ORDER — OXYCODONE HCL 5 MG PO TABS
5.0000 mg | ORAL_TABLET | ORAL | Status: DC | PRN
  Administered 2015-04-28: 10 mg via ORAL

## 2015-04-28 MED ORDER — PROMETHAZINE HCL 25 MG/ML IJ SOLN: 6.2500 mg | INTRAMUSCULAR | Status: DC | PRN

## 2015-04-28 MED ORDER — SODIUM CHLORIDE 0.9 % IV SOLN: INTRAVENOUS | Status: DC

## 2015-04-28 MED ORDER — LIDOCAINE HCL (CARDIAC) 20 MG/ML IV SOLN
INTRAVENOUS | Status: DC | PRN
  Administered 2015-04-28: 60 mg via INTRAVENOUS

## 2015-04-28 MED ORDER — OXYCODONE-ACETAMINOPHEN 5-325 MG PO TABS: 1.0000 | ORAL_TABLET | ORAL | Status: DC | PRN

## 2015-04-28 SURGICAL SUPPLY — 45 items
BLADE SURG 10 STRL SS (BLADE) ×3 IMPLANT
BLADE SURG 15 STRL LF DISP TIS (BLADE) ×1 IMPLANT
BLADE SURG 15 STRL SS (BLADE) ×3
BLADE SURG ROTATE 9660 (MISCELLANEOUS) IMPLANT
CANISTER SUCTION 2500CC (MISCELLANEOUS) IMPLANT
CHLORAPREP W/TINT 26ML (MISCELLANEOUS) ×3 IMPLANT
COVER SURGICAL LIGHT HANDLE (MISCELLANEOUS) ×3 IMPLANT
DECANTER SPIKE VIAL GLASS SM (MISCELLANEOUS) ×3 IMPLANT
DRAPE LAPAROSCOPIC ABDOMINAL (DRAPES) ×2 IMPLANT
DRAPE PED LAPAROTOMY (DRAPES) ×3 IMPLANT
DRAPE UTILITY XL STRL (DRAPES) ×6 IMPLANT
DRSG TEGADERM 4X4.75 (GAUZE/BANDAGES/DRESSINGS) ×3 IMPLANT
ELECT CAUTERY BLADE 6.4 (BLADE) ×3 IMPLANT
ELECT REM PT RETURN 9FT ADLT (ELECTROSURGICAL) ×3
ELECTRODE REM PT RTRN 9FT ADLT (ELECTROSURGICAL) ×1 IMPLANT
GAUZE SPONGE 2X2 8PLY STRL LF (GAUZE/BANDAGES/DRESSINGS) ×1 IMPLANT
GLOVE SURG SIGNA 7.5 PF LTX (GLOVE) ×3 IMPLANT
GLOVE SURG SS PI 7.0 STRL IVOR (GLOVE) ×2 IMPLANT
GOWN STRL REUS W/ TWL LRG LVL3 (GOWN DISPOSABLE) ×1 IMPLANT
GOWN STRL REUS W/ TWL XL LVL3 (GOWN DISPOSABLE) ×1 IMPLANT
GOWN STRL REUS W/TWL LRG LVL3 (GOWN DISPOSABLE) ×3
GOWN STRL REUS W/TWL XL LVL3 (GOWN DISPOSABLE) ×3
KIT BASIN OR (CUSTOM PROCEDURE TRAY) ×3 IMPLANT
KIT ROOM TURNOVER OR (KITS) ×3 IMPLANT
LIQUID BAND (GAUZE/BANDAGES/DRESSINGS) ×3 IMPLANT
MESH VENTRALEX ST 2.5 CRC MED (Mesh General) ×2 IMPLANT
NDL HYPO 25GX1X1/2 BEV (NEEDLE) ×1 IMPLANT
NEEDLE HYPO 25GX1X1/2 BEV (NEEDLE) ×3 IMPLANT
NS IRRIG 1000ML POUR BTL (IV SOLUTION) ×3 IMPLANT
PACK SURGICAL SETUP 50X90 (CUSTOM PROCEDURE TRAY) ×3 IMPLANT
PAD ARMBOARD 7.5X6 YLW CONV (MISCELLANEOUS) ×3 IMPLANT
PENCIL BUTTON HOLSTER BLD 10FT (ELECTRODE) ×3 IMPLANT
SPONGE GAUZE 2X2 STER 10/PKG (GAUZE/BANDAGES/DRESSINGS) ×2
SPONGE LAP 18X18 X RAY DECT (DISPOSABLE) ×3 IMPLANT
SUT MNCRL AB 4-0 PS2 18 (SUTURE) ×5 IMPLANT
SUT NOVA NAB DX-16 0-1 5-0 T12 (SUTURE) ×3 IMPLANT
SUT VIC AB 3-0 SH 27 (SUTURE) ×3
SUT VIC AB 3-0 SH 27X BRD (SUTURE) ×1 IMPLANT
SUT VICRYL 0 UR6 27IN ABS (SUTURE) ×2 IMPLANT
SYR CONTROL 10ML LL (SYRINGE) ×3 IMPLANT
TOWEL OR 17X24 6PK STRL BLUE (TOWEL DISPOSABLE) ×3 IMPLANT
TOWEL OR 17X26 10 PK STRL BLUE (TOWEL DISPOSABLE) ×3 IMPLANT
TUBE CONNECTING 12'X1/4 (SUCTIONS)
TUBE CONNECTING 12X1/4 (SUCTIONS) IMPLANT
YANKAUER SUCT BULB TIP NO VENT (SUCTIONS) IMPLANT

## 2015-04-28 NOTE — Transfer of Care (Signed)
Immediate Anesthesia Transfer of Care Note  Patient: Robert [name]  Procedure(s) Performed: Procedure(s): UMBILICAL HERNIA REPAIR WITH MESH (N/A)  REMOVAL OF PERITONEAL DIALYSIS CATHETER (N/A)  Patient Location: PACU  Anesthesia Type:General  Level of Consciousness: awake, alert , oriented and patient cooperative  Airway & Oxygen Therapy: Patient Spontanous Breathing and Patient connected to face mask oxygen  Post-op Assessment: Report given to RN, Post -op Vital signs reviewed and stable and Patient moving all extremities  Post vital signs: Reviewed and stable  Last Vitals:  Filed Vitals:   04/28/15 1006  BP: 159/69  Pulse: 75  Temp: 36.4 C  Resp: 18    Complications: No apparent anesthesia complications

## 2015-04-28 NOTE — Progress Notes (Signed)
Pt has pre existing Diatek right upper chest---blue port is accessed; red is capped & clamped. Also, has pre-e xisting RUE AVF--positive bruit & thrill.

## 2015-04-28 NOTE — Progress Notes (Signed)
R. Chest dialysis catheter (arterial) deaccessed, using sterile technique. Normal saline flush with good blood return. 2.4cc heparin instilled.   IV Team

## 2015-04-28 NOTE — Discharge Instructions (Signed)
[facility] _______[facility]  UMBILICAL OR INGUINAL HERNIA REPAIR: [name] INSTRUCTIONS  Always review your discharge instruction sheet given to you by the facility where your surgery was performed. IF YOU HAVE DISABILITY OR FAMILY LEAVE FORMS, YOU MUST BRING THEM TO THE OFFICE FOR PROCESSING.   DO NOT GIVE THEM TO YOUR DOCTOR.  1. A  prescription for pain medication may be given to you upon discharge.  Take your pain medication as prescribed, if needed.  If narcotic pain medicine is not needed, then you may take acetaminophen (Tylenol) or ibuprofen (Advil) as needed. 2. Take your usually prescribed medications unless otherwise directed. 3. If you need a refill on your pain medication, please contact your pharmacy.  They will contact our office to request authorization. Prescriptions will not be filled after 5 pm or on week-ends. 4. You should follow a light diet the first 24 hours after arrival home, such as soup and crackers, etc.  Be sure to include lots of fluids daily.  Resume your normal diet the day after surgery. 5. Most patients will experience some swelling and bruising around the umbilicus or in the groin and scrotum.  Ice packs and reclining will help.  Swelling and bruising can take several days to resolve.  6. It is common to experience some constipation if taking pain medication after surgery.  Increasing fluid intake and taking a stool softener (such as Colace) will usually help or prevent this problem from occurring.  A mild laxative (Milk of Magnesia or Miralax) should be taken according to package directions if there are no bowel movements after 48 hours. 7. Unless discharge instructions indicate otherwise, you may remove your bandages 24-48 hours after surgery, and you may shower at that time.  You may have steri-strips (small skin tapes) in place directly over the incision.  These strips should be left on the skin for 7-10 days.  If your surgeon used skin glue on the  incision, you may shower in 24 hours.  The glue will flake off over the next 2-3 weeks.  Any sutures or staples will be removed at the office during your follow-up visit. 8. ACTIVITIES:  You may resume regular (light) daily activities beginning the next day--such as daily self-care, walking, climbing stairs--gradually increasing activities as tolerated.  You may have sexual intercourse when it is comfortable.  Refrain from any heavy lifting or straining until approved by your doctor. a. You may drive when you are no longer taking prescription pain medication, you can comfortably wear a seatbelt, and you can safely maneuver your car and apply brakes. b. RETURN TO WORK:  __________________________________________________________ 9. You should see your doctor in the office for a follow-up appointment approximately 2-3 weeks after your surgery.  Make sure that you call for this appointment within a day or two after you arrive home to insure a convenient appointment time. 10. OTHER INSTRUCTIONS: NO LIFTING MORE THAN 15 POUNDS FOR 4 WEEKS __________________________________________________________________________________________________________________________________________________________________________________________  WHEN TO CALL YOUR DOCTOR: 1. Fever over 101.0 2. Inability to urinate 3. Nausea and/or vomiting 4. Extreme swelling or bruising 5. Continued bleeding from incision. 6. Increased pain, redness, or drainage from the incision  The clinic staff is available to answer your questions during regular business hours.  Please dont hesitate to call and ask to speak to one of the nurses for clinical concerns.  If you have a medical emergency, go to the nearest emergency room or call 911.  A surgeon from [facility] is  always on call at the hospital   [address], [place], [place], [place]  [address] ?  [address], [place],    [address] [phone] ? [phone] ?  FAX [phone] Web site: [email or web address]

## 2015-04-28 NOTE — Anesthesia Procedure Notes (Signed)
Procedure Name: LMA Insertion Date/Time: 04/28/2015 11:01 AM Performed by: Julian Reil Pre-anesthesia Checklist: Patient identified, Emergency Drugs available, Suction available and Patient being monitored Patient Re-evaluated:Patient Re-evaluated prior to inductionOxygen Delivery Method: Circle system utilized Preoxygenation: Pre-oxygenation with 100% oxygen Intubation Type: IV induction LMA: LMA inserted LMA Size: 5.0 Tube type: Oral Number of attempts: 1 Placement Confirmation: positive ETCO2 and breath sounds checked- equal and bilateral Tube secured with: Tape Dental Injury: Teeth and Oropharynx as per pre-operative assessment

## 2015-04-28 NOTE — Interval H&P Note (Signed)
History and Physical Interval Note: no change in H and P  04/28/2015 10:34 AM  Robert [name]  has presented today for surgery, with the diagnosis of Recurrent Umbilical Hernia and PD Cath Dysfunction  The various methods of treatment have been discussed with the patient and family. After consideration of risks, benefits and other options for treatment, the patient has consented to  Procedure(s): UMBILICAL HERNIA REPAIR WITH MESH (N/A) MINOR REMOVAL OF PERITONEAL DIALYSIS CATHETER (N/A) as a surgical intervention .  The patient's history has been reviewed, patient examined, no change in status, stable for surgery.  I have reviewed the patient's chart and labs.  Questions were answered to the patient's satisfaction.     [name]

## 2015-04-28 NOTE — Op Note (Signed)
NAME:  Robert [name], Robert [name] NO.:  [id number]  MEDICAL RECORD NO.:  [id number]  LOCATION:  [facility]                         FACILITY:  [facility]  PHYSICIAN:  Coralie Keens, M.D. DATE OF BIRTH:  Jun 14, 1961  DATE OF PROCEDURE:  04/28/2015 DATE OF DISCHARGE:  04/28/2015                              OPERATIVE REPORT   PREOPERATIVE DIAGNOSES: 1. Recurrent umbilical hernia. 2. Peritoneal dialysis catheter dysfunction.  POSTOPERATIVE DIAGNOSES: 1. Recurrent umbilical hernia. 2. Peritoneal dialysis catheter dysfunction.  PROCEDURES: 1. Repair of recurrent umbilical hernia with mesh. 2. Peritoneal dialysis catheter removal.  SURGEON:  Coralie Keens, M.D.  ANESTHESIA:  General and 0.25% Marcaine.  ESTIMATED BLOOD LOSS:  Minimal.  PROCEDURE IN DETAIL:  The patient was brought to the operating room, identified as Robert [name].  He was placed supine on the operating table and general anesthesia induced.  His abdomen was then prepped and draped in usual sterile fashion.  I anesthetized the skin at the lower edge of the umbilicus with Marcaine and made a small transverse incision with a scalpel.  I took this down to the hernia sac which I separated from the overlying umbilical skin.  I then excised the hernia sac in its entirety.  Next, I brought 3.4 cm round ventral patch from [place] on to the field.  I placed it through the fascial opening and pulled it up against the peritoneum with the stay ties.  I then sewed the mesh in circumferentially with interrupted #1 Novafil sutures.  I then cut the stay ties and closed the fascia over top of the mesh with a figure-of- eight #1 Novafil suture as well.  I then anesthetized the fascia.  I tacked the umbilical skin back in place with 3-0 Vicryl sutures.  I then closed subcutaneous tissue with interrupted 3-0 Vicryl sutures and closed the skin with running 4-0 Monocryl.  Next, I made elliptical incision around the skin at the PD  catheter site and excised this skin down to the cuff with the cautery.  I then had to make 2 separate skin incisions through previous scars after anesthetizing with Marcaine in order to remove pieces of the catheter in its entirety.  Once the catheter was removed from the abdominal cavity, I then closed the small opening in the peritoneum with a figure-of-eight 0 Vicryl suture.  All incisions were then anesthetized with Marcaine and closed with 4-0 Monocryl subcuticular sutures.  Skin glue was then applied to all incisions.  The patient tolerated the procedure well.  All the counts were correct at the end of procedure.  The patient was then extubated in the operating room and taken in stable condition to recovery room.     Coralie Keens, M.D.     DB/MEDQ  D:  04/28/2015  T:  04/28/2015  Job:  [id number]

## 2015-04-28 NOTE — Op Note (Signed)
UMBILICAL HERNIA REPAIR WITH MESH,  REMOVAL OF PERITONEAL DIALYSIS CATHETER  Procedure Note  Robert [name] 04/28/2015   Pre-op Diagnosis: Recurrent Umbilical Hernia and PD Catheter Dysfunction     Post-op Diagnosis: same  Procedure(s): UMBILICAL HERNIA REPAIR WITH MESH  REMOVAL OF PERITONEAL DIALYSIS CATHETER  Surgeon(s): Coralie Keens, MD  Anesthesia: General  Staff:  Circulator: Beverlee Nims, RN; Christen Bame, RN Scrub Person: Milas Kocher, RN  Estimated Blood Loss: Minimal                         [name]   Date: 04/28/2015  Time: 12:00 PM

## 2015-04-28 NOTE — Anesthesia Preprocedure Evaluation (Addendum)
Anesthesia Evaluation  Patient identified by MRN, date of birth, ID band Patient awake    Reviewed: Allergy & Precautions, NPO status , Patient's Chart, lab work & pertinent test results, reviewed documented beta blocker date and time   History of Anesthesia Complications Negative for: history of anesthetic complications  Airway Mallampati: III  TM Distance: >3 FB Neck ROM: Full    Dental  (+) Dental Advisory Given, Teeth Intact   Pulmonary sleep apnea , former smoker,    Pulmonary exam normal       Cardiovascular hypertension, Pt. on medications and Pt. on home beta blockers Normal cardiovascular exam+ dysrhythmias Supra Ventricular Tachycardia     Neuro/Psych  Headaches, negative psych ROS   GI/Hepatic Neg liver ROS, GERD-  ,  Endo/Other  diabetesHypothyroidism   Renal/GU ESRF and DialysisRenal disease     Musculoskeletal   Abdominal   Peds  Hematology   Anesthesia Other Findings   Reproductive/Obstetrics                            Anesthesia Physical Anesthesia Plan  ASA: III  Anesthesia Plan: General   Post-op Pain Management:    Induction: Intravenous  Airway Management Planned: LMA  Additional Equipment:   Intra-op Plan:   Post-operative Plan: Extubation in OR  Informed Consent: I have reviewed the patients History and Physical, chart, labs and discussed the procedure including the risks, benefits and alternatives for the proposed anesthesia with the patient or authorized representative who has indicated his/her understanding and acceptance.   Dental advisory given  Plan Discussed with: CRNA, Anesthesiologist and Surgeon  Anesthesia Plan Comments:         Anesthesia Quick Evaluation

## 2015-04-28 NOTE — Progress Notes (Signed)
Ok per Dr.Singer to use dialysis cath since IV attempt was unsuccessful

## 2015-04-29 ENCOUNTER — Encounter (HOSPITAL_COMMUNITY): Payer: Self-pay | Admitting: Surgery

## 2015-04-29 ENCOUNTER — Other Ambulatory Visit: Payer: Medicare Other

## 2015-04-29 ENCOUNTER — Ambulatory Visit (INDEPENDENT_AMBULATORY_CARE_PROVIDER_SITE_OTHER): Payer: Medicare Other | Admitting: Podiatry

## 2015-04-29 DIAGNOSIS — M79676 Pain in unspecified toe(s): Secondary | ICD-10-CM | POA: Diagnosis not present

## 2015-04-29 DIAGNOSIS — E1342 Other specified diabetes mellitus with diabetic polyneuropathy: Secondary | ICD-10-CM | POA: Diagnosis not present

## 2015-04-29 DIAGNOSIS — B351 Tinea unguium: Secondary | ICD-10-CM

## 2015-04-29 DIAGNOSIS — E1140 Type 2 diabetes mellitus with diabetic neuropathy, unspecified: Secondary | ICD-10-CM | POA: Diagnosis not present

## 2015-04-29 DIAGNOSIS — M2042 Other hammer toe(s) (acquired), left foot: Secondary | ICD-10-CM | POA: Diagnosis not present

## 2015-04-29 DIAGNOSIS — M2041 Other hammer toe(s) (acquired), right foot: Secondary | ICD-10-CM | POA: Diagnosis not present

## 2015-04-29 DIAGNOSIS — E1149 Type 2 diabetes mellitus with other diabetic neurological complication: Secondary | ICD-10-CM

## 2015-04-29 NOTE — Patient Instructions (Signed)

## 2015-04-29 NOTE — Anesthesia Postprocedure Evaluation (Signed)
Anesthesia Post Note  Patient: Robert [name]  Procedure(s) Performed: Procedure(s) (LRB): UMBILICAL HERNIA REPAIR WITH MESH (N/A)  REMOVAL OF PERITONEAL DIALYSIS CATHETER (N/A)  Anesthesia type: general  Patient location: PACU  Post pain: Pain level controlled  Post assessment: Patient's Cardiovascular Status Stable  Last Vitals:  Filed Vitals:   04/28/15 1300  BP:   Pulse: 72  Temp: 36.7 C  Resp: 17    Post vital signs: Reviewed and stable  Level of consciousness: sedated  Complications: No apparent anesthesia complications

## 2015-04-29 NOTE — Progress Notes (Signed)
Subjective: 54 y.o. returns the office today for painful, elongated, thickened toenails which he cannot trim himself. Denies any redness or drainage around the nails. He also presents today because of diabetic shoes. Denies any acute changes since last appointment and no new complaints today. Denies any systemic complaints such as fevers, chills, nausea, vomiting.   Objective: AAO 3, NAD DP/PT pulses palpable, CRT less than 3 seconds Protective sensation decreased with Simms Weinstein monofilament, Achilles tendon reflex intact.  Nails hypertrophic, dystrophic, elongated, brittle, discolored 10. There is tenderness overlying the nails 1-5 bilaterally. There is no surrounding erythema or drainage along the nail sites. No open lesions or pre-ulcerative lesions are identified. No other areas of tenderness bilateral lower extremities. No overlying edema, erythema, increased warmth. No pain with calf compression, swelling, warmth, erythema.  Assessment: Patient presents with symptomatic onychomycosis  Plan: -Treatment options including alternatives, risks, complications were discussed -Nails sharply debrided 10 without complication/bleeding. -Discussed daily foot inspection. If there are any changes, to call the office immediately.  -Diabetic shoes were dispensed the patient. He tried that she is on appear to be fitting well any high-pressure areas of problems. Break in instructions were discussed with the patient. At the same problems with the shoes to call the office to be daily. -Follow-up in 3 months or sooner if any problems are to arise. In the meantime, encouraged to call the office with any questions, concerns, changes symptoms.   Celesta Gentile, DPM

## 2015-05-02 ENCOUNTER — Other Ambulatory Visit (INDEPENDENT_AMBULATORY_CARE_PROVIDER_SITE_OTHER): Payer: BLUE CROSS/BLUE SHIELD

## 2015-05-02 DIAGNOSIS — E039 Hypothyroidism, unspecified: Secondary | ICD-10-CM | POA: Diagnosis not present

## 2015-05-02 DIAGNOSIS — E1165 Type 2 diabetes mellitus with hyperglycemia: Secondary | ICD-10-CM

## 2015-05-02 DIAGNOSIS — IMO0002 Reserved for concepts with insufficient information to code with codable children: Secondary | ICD-10-CM

## 2015-05-02 LAB — HEMOGLOBIN A1C: Hgb A1c MFr Bld: 5.9 % (ref 4.6–6.5)

## 2015-05-02 LAB — GLUCOSE, RANDOM: Glucose, Bld: 136 mg/dL — ABNORMAL HIGH (ref 70–99)

## 2015-05-02 LAB — TSH: TSH: 1.83 u[IU]/mL (ref 0.35–4.50)

## 2015-05-04 ENCOUNTER — Inpatient Hospital Stay (HOSPITAL_COMMUNITY): Payer: BLUE CROSS/BLUE SHIELD | Admitting: Anesthesiology

## 2015-05-04 ENCOUNTER — Inpatient Hospital Stay (HOSPITAL_COMMUNITY)
Admission: EM | Admit: 2015-05-04 | Discharge: 2015-05-08 | DRG: 356 | Disposition: A | Discharge status: Home / Self Care | Payer: BLUE CROSS/BLUE SHIELD | Attending: Internal Medicine | Admitting: Internal Medicine

## 2015-05-04 ENCOUNTER — Encounter (HOSPITAL_COMMUNITY)
Admission: EM | Disposition: A | Discharge status: Home / Self Care | Payer: Self-pay | Source: Home / Self Care | Attending: Internal Medicine

## 2015-05-04 ENCOUNTER — Encounter (HOSPITAL_COMMUNITY): Payer: Self-pay | Admitting: Emergency Medicine

## 2015-05-04 ENCOUNTER — Emergency Department (HOSPITAL_COMMUNITY): Payer: BLUE CROSS/BLUE SHIELD

## 2015-05-04 DIAGNOSIS — E669 Obesity, unspecified: Secondary | ICD-10-CM

## 2015-05-04 DIAGNOSIS — D638 Anemia in other chronic diseases classified elsewhere: Secondary | ICD-10-CM | POA: Diagnosis present

## 2015-05-04 DIAGNOSIS — E1122 Type 2 diabetes mellitus with diabetic chronic kidney disease: Secondary | ICD-10-CM | POA: Diagnosis present

## 2015-05-04 DIAGNOSIS — Z85828 Personal history of other malignant neoplasm of skin: Secondary | ICD-10-CM

## 2015-05-04 DIAGNOSIS — Z7982 Long term (current) use of aspirin: Secondary | ICD-10-CM | POA: Diagnosis not present

## 2015-05-04 DIAGNOSIS — G4733 Obstructive sleep apnea (adult) (pediatric): Secondary | ICD-10-CM | POA: Diagnosis present

## 2015-05-04 DIAGNOSIS — K219 Gastro-esophageal reflux disease without esophagitis: Secondary | ICD-10-CM | POA: Diagnosis present

## 2015-05-04 DIAGNOSIS — E785 Hyperlipidemia, unspecified: Secondary | ICD-10-CM | POA: Diagnosis present

## 2015-05-04 DIAGNOSIS — Z905 Acquired absence of kidney: Secondary | ICD-10-CM | POA: Diagnosis present

## 2015-05-04 DIAGNOSIS — E119 Type 2 diabetes mellitus without complications: Secondary | ICD-10-CM

## 2015-05-04 DIAGNOSIS — E1142 Type 2 diabetes mellitus with diabetic polyneuropathy: Secondary | ICD-10-CM | POA: Diagnosis present

## 2015-05-04 DIAGNOSIS — Z79899 Other long term (current) drug therapy: Secondary | ICD-10-CM | POA: Diagnosis not present

## 2015-05-04 DIAGNOSIS — N186 End stage renal disease: Secondary | ICD-10-CM | POA: Diagnosis present

## 2015-05-04 DIAGNOSIS — D509 Iron deficiency anemia, unspecified: Secondary | ICD-10-CM | POA: Diagnosis present

## 2015-05-04 DIAGNOSIS — D649 Anemia, unspecified: Secondary | ICD-10-CM | POA: Diagnosis present

## 2015-05-04 DIAGNOSIS — I120 Hypertensive chronic kidney disease with stage 5 chronic kidney disease or end stage renal disease: Secondary | ICD-10-CM | POA: Diagnosis present

## 2015-05-04 DIAGNOSIS — Z6841 Body Mass Index (BMI) 40.0 and over, adult: Secondary | ICD-10-CM

## 2015-05-04 DIAGNOSIS — H3530 Unspecified macular degeneration: Secondary | ICD-10-CM | POA: Diagnosis present

## 2015-05-04 DIAGNOSIS — Z794 Long term (current) use of insulin: Secondary | ICD-10-CM

## 2015-05-04 DIAGNOSIS — R109 Unspecified abdominal pain: Secondary | ICD-10-CM

## 2015-05-04 DIAGNOSIS — N189 Chronic kidney disease, unspecified: Secondary | ICD-10-CM | POA: Insufficient documentation

## 2015-05-04 DIAGNOSIS — K5669 Other intestinal obstruction: Secondary | ICD-10-CM

## 2015-05-04 DIAGNOSIS — E039 Hypothyroidism, unspecified: Secondary | ICD-10-CM | POA: Diagnosis present

## 2015-05-04 DIAGNOSIS — Z992 Dependence on renal dialysis: Secondary | ICD-10-CM | POA: Diagnosis not present

## 2015-05-04 DIAGNOSIS — Y838 Other surgical procedures as the cause of abnormal reaction of the patient, or of later complication, without mention of misadventure at the time of the procedure: Secondary | ICD-10-CM | POA: Diagnosis present

## 2015-05-04 DIAGNOSIS — E1169 Type 2 diabetes mellitus with other specified complication: Secondary | ICD-10-CM

## 2015-05-04 DIAGNOSIS — N2581 Secondary hyperparathyroidism of renal origin: Secondary | ICD-10-CM | POA: Diagnosis present

## 2015-05-04 DIAGNOSIS — K56609 Unspecified intestinal obstruction, unspecified as to partial versus complete obstruction: Secondary | ICD-10-CM | POA: Diagnosis present

## 2015-05-04 DIAGNOSIS — Z87891 Personal history of nicotine dependence: Secondary | ICD-10-CM

## 2015-05-04 DIAGNOSIS — E1140 Type 2 diabetes mellitus with diabetic neuropathy, unspecified: Secondary | ICD-10-CM | POA: Diagnosis present

## 2015-05-04 DIAGNOSIS — N1832 Chronic kidney disease, stage 3b: Secondary | ICD-10-CM

## 2015-05-04 DIAGNOSIS — K913 Postprocedural intestinal obstruction: Secondary | ICD-10-CM | POA: Diagnosis present

## 2015-05-04 HISTORY — PX: LAPAROSCOPY: SHX197

## 2015-05-04 LAB — CBC WITH DIFFERENTIAL/PLATELET
BASOS ABS: 0 10*3/uL (ref 0.0–0.1)
Basophils Relative: 0 % (ref 0–1)
EOS PCT: 1 % (ref 0–5)
Eosinophils Absolute: 0.1 10*3/uL (ref 0.0–0.7)
HCT: 36.2 % — ABNORMAL LOW (ref 39.0–52.0)
Hemoglobin: 11.7 g/dL — ABNORMAL LOW (ref 13.0–17.0)
Lymphocytes Relative: 10 % — ABNORMAL LOW (ref 12–46)
Lymphs Abs: 1.3 10*3/uL (ref 0.7–4.0)
MCH: 32.4 pg (ref 26.0–34.0)
MCHC: 32.3 g/dL (ref 30.0–36.0)
MCV: 100.3 fL — AB (ref 78.0–100.0)
Monocytes Absolute: 0.9 10*3/uL (ref 0.1–1.0)
Monocytes Relative: 7 % (ref 3–12)
Neutro Abs: 10.6 10*3/uL — ABNORMAL HIGH (ref 1.7–7.7)
Neutrophils Relative %: 82 % — ABNORMAL HIGH (ref 43–77)
Platelets: 296 10*3/uL (ref 150–400)
RBC: 3.61 MIL/uL — AB (ref 4.22–5.81)
RDW: 15.3 % (ref 11.5–15.5)
WBC: 13 10*3/uL — AB (ref 4.0–10.5)

## 2015-05-04 LAB — COMPREHENSIVE METABOLIC PANEL
ALT: 6 U/L — AB (ref 17–63)
AST: 21 U/L (ref 15–41)
Albumin: 4 g/dL (ref 3.5–5.0)
Alkaline Phosphatase: 59 U/L (ref 38–126)
Anion gap: 18 — ABNORMAL HIGH (ref 5–15)
BUN: 30 mg/dL — AB (ref 6–20)
CO2: 28 mmol/L (ref 22–32)
CREATININE: 10.73 mg/dL — AB (ref 0.61–1.24)
Calcium: 10.1 mg/dL (ref 8.9–10.3)
Chloride: 91 mmol/L — ABNORMAL LOW (ref 101–111)
GFR calc non Af Amer: 5 mL/min — ABNORMAL LOW (ref >60)
GFR, EST AFRICAN AMERICAN: 5 mL/min — AB (ref >60)
GLUCOSE: 164 mg/dL — AB (ref 65–99)
Potassium: 4.9 mmol/L (ref 3.5–5.1)
Sodium: 137 mmol/L (ref 135–145)
TOTAL PROTEIN: 7.7 g/dL (ref 6.5–8.1)
Total Bilirubin: 0.6 mg/dL (ref 0.3–1.2)

## 2015-05-04 LAB — GLUCOSE, CAPILLARY
GLUCOSE-CAPILLARY: 107 mg/dL — AB (ref 65–99)
Glucose-Capillary: 118 mg/dL — ABNORMAL HIGH (ref 65–99)
Glucose-Capillary: 133 mg/dL — ABNORMAL HIGH (ref 65–99)
Glucose-Capillary: 117 mg/dL — ABNORMAL HIGH (ref 65–99)
Glucose-Capillary: 116 mg/dL — ABNORMAL HIGH (ref 65–99)

## 2015-05-04 LAB — SURGICAL PCR SCREEN
MRSA, PCR: NEGATIVE
STAPHYLOCOCCUS AUREUS: NEGATIVE

## 2015-05-04 SURGERY — LAPAROSCOPY, DIAGNOSTIC
Anesthesia: General | Site: Abdomen

## 2015-05-04 MED ORDER — NEOSTIGMINE METHYLSULFATE 10 MG/10ML IV SOLN
INTRAVENOUS | Status: DC | PRN
  Administered 2015-05-04: 5 mg via INTRAVENOUS

## 2015-05-04 MED ORDER — LIDOCAINE HCL (CARDIAC) 20 MG/ML IV SOLN
INTRAVENOUS | Status: DC | PRN
  Administered 2015-05-04: 100 mg via INTRAVENOUS

## 2015-05-04 MED ORDER — LIDOCAINE-PRILOCAINE 2.5-2.5 % EX CREA
TOPICAL_CREAM | CUTANEOUS | Status: AC
  Administered 2015-05-04: 16:00:00
  Filled 2015-05-04: qty 5

## 2015-05-04 MED ORDER — INSULIN ASPART 100 UNIT/ML ~~LOC~~ SOLN: 0.0000 [IU] | Freq: Three times a day (TID) | SUBCUTANEOUS | Status: DC

## 2015-05-04 MED ORDER — LANTHANUM CARBONATE 500 MG PO CHEW: 1000.0000 mg | CHEWABLE_TABLET | Freq: Three times a day (TID) | ORAL | Status: DC

## 2015-05-04 MED ORDER — ACETAMINOPHEN 325 MG PO TABS
650.0000 mg | ORAL_TABLET | Freq: Four times a day (QID) | ORAL | Status: DC | PRN
  Administered 2015-05-06: 650 mg via ORAL

## 2015-05-04 MED ORDER — BUPIVACAINE-EPINEPHRINE 0.25% -1:200000 IJ SOLN
INTRAMUSCULAR | Status: DC | PRN
  Administered 2015-05-04: 10 mL

## 2015-05-04 MED ORDER — NEBIVOLOL HCL 10 MG PO TABS
15.0000 mg | ORAL_TABLET | Freq: Every day | ORAL | Status: DC
  Administered 2015-05-06 – 2015-05-08 (×3): 15 mg via ORAL
  Filled 2015-05-04 (×4): qty 2

## 2015-05-04 MED ORDER — STERILE WATER FOR INJECTION IJ SOLN
INTRAMUSCULAR | Status: AC
  Filled 2015-05-04: qty 10

## 2015-05-04 MED ORDER — CEFAZOLIN (ANCEF) 1 G IV SOLR
2.0000 g | INTRAVENOUS | Status: DC
  Filled 2015-05-04: qty 2

## 2015-05-04 MED ORDER — PROMETHAZINE HCL 25 MG/ML IJ SOLN: 6.2500 mg | INTRAMUSCULAR | Status: DC | PRN

## 2015-05-04 MED ORDER — MIDAZOLAM HCL 5 MG/5ML IJ SOLN
INTRAMUSCULAR | Status: DC | PRN
  Administered 2015-05-04: 2 mg via INTRAVENOUS

## 2015-05-04 MED ORDER — CALCITRIOL 0.5 MCG PO CAPS
0.5000 ug | ORAL_CAPSULE | ORAL | Status: DC
  Administered 2015-05-06: 0.5 ug via ORAL
  Filled 2015-05-04 (×4): qty 1

## 2015-05-04 MED ORDER — VECURONIUM BROMIDE 10 MG IV SOLR
INTRAVENOUS | Status: AC
  Filled 2015-05-04: qty 10

## 2015-05-04 MED ORDER — ALBUMIN HUMAN 5 % IV SOLN
INTRAVENOUS | Status: DC | PRN
  Administered 2015-05-04: 14:00:00 via INTRAVENOUS

## 2015-05-04 MED ORDER — HYDROMORPHONE HCL 1 MG/ML IJ SOLN: 0.2500 mg | INTRAMUSCULAR | Status: DC | PRN

## 2015-05-04 MED ORDER — CALCIUM ACETATE (PHOS BINDER) 667 MG PO CAPS
667.0000 mg | ORAL_CAPSULE | Freq: Three times a day (TID) | ORAL | Status: DC
  Administered 2015-05-06 – 2015-05-08 (×6): 667 mg via ORAL
  Filled 2015-05-04 (×16): qty 1

## 2015-05-04 MED ORDER — FLUOCINONIDE 0.05 % EX CREA
1.0000 "application " | TOPICAL_CREAM | Freq: Every day | CUTANEOUS | Status: DC | PRN
  Filled 2015-05-04: qty 30

## 2015-05-04 MED ORDER — ONDANSETRON HCL 4 MG/2ML IJ SOLN
4.0000 mg | Freq: Once | INTRAMUSCULAR | Status: AC
  Administered 2015-05-04: 4 mg via INTRAVENOUS
  Filled 2015-05-04: qty 2

## 2015-05-04 MED ORDER — SODIUM CHLORIDE 0.9 % IV SOLN: 125.0000 mg | Freq: Once | INTRAVENOUS | Status: DC

## 2015-05-04 MED ORDER — SODIUM CHLORIDE 0.9 % IR SOLN
Status: DC | PRN
  Administered 2015-05-04: 1000 mL

## 2015-05-04 MED ORDER — ALUM & MAG HYDROXIDE-SIMETH 200-200-20 MG/5ML PO SUSP: 30.0000 mL | Freq: Four times a day (QID) | ORAL | Status: DC | PRN

## 2015-05-04 MED ORDER — PHENYLEPHRINE HCL 10 MG/ML IJ SOLN
INTRAMUSCULAR | Status: DC | PRN
  Administered 2015-05-04: 40 ug via INTRAVENOUS
  Administered 2015-05-04 (×2): 80 ug via INTRAVENOUS
  Administered 2015-05-04: 120 ug via INTRAVENOUS
  Administered 2015-05-04: 80 ug via INTRAVENOUS

## 2015-05-04 MED ORDER — PROPOFOL 10 MG/ML IV BOLUS
INTRAVENOUS | Status: AC
  Filled 2015-05-04: qty 20

## 2015-05-04 MED ORDER — CISATRACURIUM BESYLATE (PF) 10 MG/5ML IV SOLN
INTRAVENOUS | Status: DC | PRN
  Administered 2015-05-04: 4 mg via INTRAVENOUS

## 2015-05-04 MED ORDER — SUCCINYLCHOLINE CHLORIDE 20 MG/ML IJ SOLN
INTRAMUSCULAR | Status: AC
  Filled 2015-05-04: qty 1

## 2015-05-04 MED ORDER — LIDOCAINE HCL (CARDIAC) 20 MG/ML IV SOLN
INTRAVENOUS | Status: AC
Start: 2015-05-04 — End: 2015-05-04
  Filled 2015-05-04: qty 5

## 2015-05-04 MED ORDER — LANTHANUM CARBONATE 500 MG PO CHEW
2000.0000 mg | CHEWABLE_TABLET | Freq: Three times a day (TID) | ORAL | Status: DC
  Administered 2015-05-06 – 2015-05-08 (×6): 2000 mg via ORAL
  Filled 2015-05-04 (×6): qty 4

## 2015-05-04 MED ORDER — PROPOFOL 10 MG/ML IV BOLUS
INTRAVENOUS | Status: DC | PRN
  Administered 2015-05-04: 250 mg via INTRAVENOUS
  Administered 2015-05-04: 50 mg via INTRAVENOUS
  Administered 2015-05-04: 40 mg via INTRAVENOUS

## 2015-05-04 MED ORDER — MORPHINE SULFATE 2 MG/ML IJ SOLN
1.0000 mg | INTRAMUSCULAR | Status: DC | PRN
  Administered 2015-05-05 (×3): 2 mg via INTRAVENOUS
  Filled 2015-05-04 (×3): qty 1

## 2015-05-04 MED ORDER — ASPIRIN EC 81 MG PO TBEC
81.0000 mg | DELAYED_RELEASE_TABLET | Freq: Every morning | ORAL | Status: DC
  Administered 2015-05-06 – 2015-05-08 (×3): 81 mg via ORAL
  Filled 2015-05-04 (×3): qty 1

## 2015-05-04 MED ORDER — DARBEPOETIN ALFA 200 MCG/0.4ML IJ SOSY
200.0000 ug | PREFILLED_SYRINGE | INTRAMUSCULAR | Status: DC
  Filled 2015-05-04: qty 0.4

## 2015-05-04 MED ORDER — SODIUM CHLORIDE 0.9 % IV SOLN
INTRAVENOUS | Status: DC
  Administered 2015-05-04: 22:00:00 via INTRAVENOUS
  Administered 2015-05-04: 35 mL/h via INTRAVENOUS
  Administered 2015-05-04 – 2015-05-05 (×2): via INTRAVENOUS

## 2015-05-04 MED ORDER — LORATADINE 10 MG PO TABS
10.0000 mg | ORAL_TABLET | Freq: Every day | ORAL | Status: DC
  Administered 2015-05-06 – 2015-05-08 (×3): 10 mg via ORAL
  Filled 2015-05-04 (×3): qty 1

## 2015-05-04 MED ORDER — SODIUM CHLORIDE 0.9 % IV SOLN
125.0000 mg | INTRAVENOUS | Status: DC
  Administered 2015-05-06: 125 mg via INTRAVENOUS
  Filled 2015-05-04 (×4): qty 10

## 2015-05-04 MED ORDER — SUCCINYLCHOLINE CHLORIDE 20 MG/ML IJ SOLN
INTRAMUSCULAR | Status: DC | PRN
  Administered 2015-05-04: 200 mg via INTRAVENOUS

## 2015-05-04 MED ORDER — BUPIVACAINE-EPINEPHRINE (PF) 0.25% -1:200000 IJ SOLN
INTRAMUSCULAR | Status: AC
  Filled 2015-05-04: qty 30

## 2015-05-04 MED ORDER — ONDANSETRON HCL 4 MG/2ML IJ SOLN
INTRAMUSCULAR | Status: AC
  Administered 2015-05-04: 4 mg via INTRAVENOUS
  Filled 2015-05-04: qty 2

## 2015-05-04 MED ORDER — CISATRACURIUM BESYLATE 20 MG/10ML IV SOLN
INTRAVENOUS | Status: AC
  Filled 2015-05-04: qty 10

## 2015-05-04 MED ORDER — LEVOTHYROXINE SODIUM 100 MCG IV SOLR
50.0000 ug | Freq: Every day | INTRAVENOUS | Status: DC
  Administered 2015-05-05 – 2015-05-06 (×2): 50 ug via INTRAVENOUS
  Filled 2015-05-04 (×6): qty 5

## 2015-05-04 MED ORDER — PROMETHAZINE HCL 25 MG PO TABS
12.5000 mg | ORAL_TABLET | Freq: Four times a day (QID) | ORAL | Status: DC | PRN
  Administered 2015-05-07: 12.5 mg via ORAL
  Filled 2015-05-04 (×3): qty 1

## 2015-05-04 MED ORDER — MEPERIDINE HCL 25 MG/ML IJ SOLN: 6.2500 mg | INTRAMUSCULAR | Status: DC | PRN

## 2015-05-04 MED ORDER — INSULIN GLARGINE 100 UNIT/ML ~~LOC~~ SOLN
15.0000 [IU] | SUBCUTANEOUS | Status: DC
  Filled 2015-05-04 (×3): qty 0.15

## 2015-05-04 MED ORDER — FENTANYL CITRATE (PF) 250 MCG/5ML IJ SOLN
INTRAMUSCULAR | Status: AC
  Filled 2015-05-04: qty 5

## 2015-05-04 MED ORDER — MIDAZOLAM HCL 2 MG/2ML IJ SOLN
INTRAMUSCULAR | Status: AC
  Filled 2015-05-04: qty 2

## 2015-05-04 MED ORDER — CEFAZOLIN SODIUM-DEXTROSE 2-3 GM-% IV SOLR
2.0000 g | INTRAVENOUS | Status: AC
  Administered 2015-05-04: 2 g via INTRAVENOUS
  Filled 2015-05-04: qty 50

## 2015-05-04 MED ORDER — FENTANYL CITRATE (PF) 100 MCG/2ML IJ SOLN
INTRAMUSCULAR | Status: DC | PRN
  Administered 2015-05-04: 100 ug via INTRAVENOUS
  Administered 2015-05-04: 50 ug via INTRAVENOUS

## 2015-05-04 MED ORDER — GLYCOPYRROLATE 0.2 MG/ML IJ SOLN
INTRAMUSCULAR | Status: DC | PRN
  Administered 2015-05-04: 0.6 mg via INTRAVENOUS

## 2015-05-04 MED ORDER — ACETAMINOPHEN 650 MG RE SUPP
650.0000 mg | Freq: Four times a day (QID) | RECTAL | Status: DC | PRN
  Administered 2015-05-05 (×2): 650 mg via RECTAL
  Filled 2015-05-04 (×2): qty 1

## 2015-05-04 MED ORDER — CEFAZOLIN SODIUM-DEXTROSE 2-3 GM-% IV SOLR
2.0000 g | Freq: Three times a day (TID) | INTRAVENOUS | Status: DC
  Filled 2015-05-04 (×2): qty 50

## 2015-05-04 SURGICAL SUPPLY — 58 items
APPLIER CLIP 5 13 M/L LIGAMAX5 (MISCELLANEOUS)
APR CLP MED LRG 5 ANG JAW (MISCELLANEOUS)
BLADE SURG ROTATE 9660 (MISCELLANEOUS) IMPLANT
CANISTER SUCTION 2500CC (MISCELLANEOUS) ×4 IMPLANT
CHLORAPREP W/TINT 26ML (MISCELLANEOUS) ×4 IMPLANT
CLIP APPLIE 5 13 M/L LIGAMAX5 (MISCELLANEOUS) IMPLANT
COVER MAYO STAND STRL (DRAPES) IMPLANT
COVER SURGICAL LIGHT HANDLE (MISCELLANEOUS) ×4 IMPLANT
DECANTER SPIKE VIAL GLASS SM (MISCELLANEOUS) ×1 IMPLANT
DRAPE C-ARM 42X72 X-RAY (DRAPES) IMPLANT
DRAPE LAPAROSCOPIC ABDOMINAL (DRAPES) ×4 IMPLANT
DRAPE PROXIMA HALF (DRAPES) IMPLANT
DRAPE UTILITY XL STRL (DRAPES) ×8 IMPLANT
DRAPE WARM FLUID 44X44 (DRAPE) ×1 IMPLANT
DRSG OPSITE POSTOP 4X10 (GAUZE/BANDAGES/DRESSINGS) IMPLANT
DRSG OPSITE POSTOP 4X8 (GAUZE/BANDAGES/DRESSINGS) IMPLANT
ELECT BLADE 6.5 EXT (BLADE) IMPLANT
ELECT CAUTERY BLADE 6.4 (BLADE) ×4 IMPLANT
ELECT REM PT RETURN 9FT ADLT (ELECTROSURGICAL) ×4
ELECTRODE REM PT RTRN 9FT ADLT (ELECTROSURGICAL) ×2 IMPLANT
GLOVE SURG SIGNA 7.5 PF LTX (GLOVE) ×4 IMPLANT
GOWN STRL REUS W/ TWL LRG LVL3 (GOWN DISPOSABLE) ×4 IMPLANT
GOWN STRL REUS W/ TWL XL LVL3 (GOWN DISPOSABLE) ×2 IMPLANT
GOWN STRL REUS W/TWL LRG LVL3 (GOWN DISPOSABLE) ×8
GOWN STRL REUS W/TWL XL LVL3 (GOWN DISPOSABLE) ×4
HOVERMATT SINGLE USE (MISCELLANEOUS) ×3 IMPLANT
KIT BASIN OR (CUSTOM PROCEDURE TRAY) ×4 IMPLANT
KIT ROOM TURNOVER OR (KITS) ×4 IMPLANT
LIGASURE IMPACT 36 18CM CVD LR (INSTRUMENTS) IMPLANT
LIQUID BAND (GAUZE/BANDAGES/DRESSINGS) ×4 IMPLANT
NS IRRIG 1000ML POUR BTL (IV SOLUTION) ×8 IMPLANT
PACK GENERAL/GYN (CUSTOM PROCEDURE TRAY) ×1 IMPLANT
PAD ARMBOARD 7.5X6 YLW CONV (MISCELLANEOUS) ×4 IMPLANT
PENCIL BUTTON HOLSTER BLD 10FT (ELECTRODE) IMPLANT
SCISSORS LAP 5X35 DISP (ENDOMECHANICALS) ×3 IMPLANT
SET IRRIG TUBING LAPAROSCOPIC (IRRIGATION / IRRIGATOR) ×3 IMPLANT
SLEEVE ENDOPATH XCEL 5M (ENDOMECHANICALS) ×7 IMPLANT
SPECIMEN JAR LARGE (MISCELLANEOUS) IMPLANT
SPONGE LAP 18X18 X RAY DECT (DISPOSABLE) IMPLANT
STAPLER VISISTAT 35W (STAPLE) ×1 IMPLANT
SUCTION POOLE TIP (SUCTIONS) ×1 IMPLANT
SUT MON AB 4-0 PC3 18 (SUTURE) ×4 IMPLANT
SUT PDS AB 1 TP1 96 (SUTURE) ×2 IMPLANT
SUT SILK 2 0 SH CR/8 (SUTURE) ×1 IMPLANT
SUT SILK 2 0 TIES 10X30 (SUTURE) ×1 IMPLANT
SUT SILK 3 0 SH CR/8 (SUTURE) ×1 IMPLANT
SUT SILK 3 0 TIES 10X30 (SUTURE) ×1 IMPLANT
SUT VIC AB 3-0 SH 18 (SUTURE) IMPLANT
TOWEL OR 17X24 6PK STRL BLUE (TOWEL DISPOSABLE) ×4 IMPLANT
TOWEL OR 17X26 10 PK STRL BLUE (TOWEL DISPOSABLE) ×4 IMPLANT
TRAY FOLEY CATH 16FRSI W/METER (SET/KITS/TRAYS/PACK) IMPLANT
TRAY LAPAROSCOPIC MC (CUSTOM PROCEDURE TRAY) ×4 IMPLANT
TROCAR XCEL NON-BLD 11X100MML (ENDOMECHANICALS) IMPLANT
TROCAR XCEL NON-BLD 5MMX100MML (ENDOMECHANICALS) ×4 IMPLANT
TUBE CONNECTING 12'X1/4 (SUCTIONS)
TUBE CONNECTING 12X1/4 (SUCTIONS) IMPLANT
TUBING INSUFFLATION (TUBING) ×4 IMPLANT
YANKAUER SUCT BULB TIP NO VENT (SUCTIONS) IMPLANT

## 2015-05-04 NOTE — H&P (Signed)
History and Physical  Robert [name] [id number] DOB: 07/11/1961 DOA: 05/04/2015  Referring physician: Dr Christy Gentles, ED physician PCP: Haywood Pao, MD   Chief Complaint: Vomiting  HPI: Robert [name] is a 54 y.o. male  With a history of diabetes, hypothyroidism, end-stage renal disease, GERD, obstructive sleep apnea. Patient recently had surgery with Dr. Ninfa Linden for umbilical hernia repair with mesh and removal of peritoneal dialysis catheter on 04/28/2015. The surgery was uneventful and without complications. His recovery was normal until 2 days ago when he had gradual worsening of emesis a transition to black emesis. Episodes of emesis occurred 2-3 times a day. Additionally, he had minimal stool output, although has continued to have flatulence. He reports no palliating or provoking factors.  He has dialysis on Monday Wednesday and Friday.   Review of Systems:   Pt denies any fevers, chills, chest pain, shortness of breath, palpitations, lightheadedness, dizziness, abdominal pain, diarrhea, hematuria, rectal bleeding.  Review of systems are otherwise negative  Past Medical History  Diagnosis Date  . Diabetes mellitus without complication   . Thyroid disease   . Hyperlipidemia   . PAT (paroxysmal atrial tachycardia)   . PSVT (paroxysmal supraventricular tachycardia)   . Blood transfusion without reported diagnosis 1974    kidney removed - L  . CPAP (continuous positive airway pressure) dependence   . Dysrhythmia   . Shortness of breath   . Hypothyroidism   . GERD (gastroesophageal reflux disease)     n/v a lot  . Headache(784.0)     slight dull h/a due kidney failure  . Anemia     low hgb at present  . Umbilical hernia     will repair with thi surgery  . Neuropathy     right hand and both feet  . Constipation   . Macular degeneration   . Eczema   . Sleep apnea 06/18/11    split-night sleep study- [facility] BiPAP- no oxygen  .  Chronic kidney disease     Stage IV, Richarda Blade. M,W,F  . Cancer     skin ca right shoulder, plastic dsyplasia, pre-Ca polpys removed on Colonoscopy- 07/2014  . Hypertension     SVT h/o , followed by Dr. Claiborne Billings   Past Surgical History  Procedure Laterality Date  . Nephrectomy Left 1974  . Renal biopsy Right 2012  . Skin cancer excision      right shoulder  . Capd insertion N/A 05/18/2014    Procedure: LAPAROSCOPIC INSERTION CONTINUOUS AMBULATORY PERITONEAL DIALYSIS  (CAPD) CATHETER;  Surgeon: Ralene Ok, MD;  Location: [place];  Service: General;  Laterality: N/A;  . Umbilical hernia repair N/A 05/18/2014    Procedure: HERNIA REPAIR UMBILICAL ADULT;  Surgeon: Ralene Ok, MD;  Location: [place];  Service: General;  Laterality: N/A;  . Laparoscopic repositioning capd catheter N/A 06/16/2014    Procedure: LAPAROSCOPIC REPOSITIONING CAPD CATHETER;  Surgeon: Ralene Ok, MD;  Location: [place];  Service: General;  Laterality: N/A;  . Ij catheter insertion    . Colonoscopy    . Insertion of dialysis catheter Right 02/25/2015    Procedure: INSERTION OF RIGHT INTERNAL JUGULAR DIALYSIS CATHETER;  Surgeon: Serafina Mitchell, MD;  Location: [place];  Service: Vascular;  Laterality: Right;  . Av fistula placement Right 02/25/2015    Procedure: RIGHT ARTERIOVENOUS (AV) FISTULA CREATION;  Surgeon: Serafina Mitchell, MD;  Location: [place] [facility];  Service: Vascular;  Laterality: Right;  . Umbilical hernia repair N/A 04/28/2015  Procedure: UMBILICAL HERNIA REPAIR WITH MESH;  Surgeon: Coralie Keens, MD;  Location: [place];  Service: General;  Laterality: N/A;  . Minor removal of peritoneal dialysis catheter N/A 04/28/2015    Procedure:  REMOVAL OF PERITONEAL DIALYSIS CATHETER;  Surgeon: Coralie Keens, MD;  Location: [place];  Service: General;  Laterality: N/A;   Social History:  reports that he quit smoking about 16 months ago. His smoking use included Pipe. He has never used smokeless tobacco. He reports that he  drinks about 0.6 oz of alcohol per week. He reports that he does not use illicit drugs. Patient lives at home & is able to participate in activities of daily living  No Known Allergies  Family History  Problem Relation Age of Onset  . Colon polyps Father   . Colon cancer Neg Hx   . Stomach cancer Neg Hx      Prior to Admission medications   Medication Sig Start Date End Date Taking? Authorizing Provider  aspirin 81 MG tablet Take 81 mg by mouth every morning.    Yes Historical Provider, MD  BYSTOLIC 5 MG tablet Take 15 mg by mouth daily.  04/04/15  Yes Historical Provider, MD  CALCITRIOL PO Take 3 tablets by mouth 3 (three) times a week. Monday, Wednesday and Friday.   Yes Historical Provider, MD  calcium acetate (PHOSLO) 667 MG capsule Take 667 mg by mouth 3 (three) times daily with meals.   Yes Historical Provider, MD  cetirizine (ZYRTEC) 10 MG tablet Take 10 mg by mouth every morning.    Yes Historical Provider, MD  docusate sodium (COLACE) 100 MG capsule Take 100 mg by mouth every evening.    Yes Historical Provider, MD  fluocinonide cream (LIDEX) [id number] % Apply 1 application topically daily as needed (Skin inflammation (hand, thigh, back)).    Yes Historical Provider, MD  FOSRENOL 1000 MG chewable tablet Chew 1,000-2,000 mg by mouth 4 (four) times daily. 2000 mg with every meal and 1000 mg with snack 12/29/14  Yes Historical Provider, MD  glucose blood (ONETOUCH VERIO) test strip Use as instructed to check blood sugar 6 a day dx code E11.29 08/04/14  Yes Elayne Snare, MD  Insulin Glargine (LANTUS SOLOSTAR) 100 UNIT/ML Solostar Pen Inject 30 Units into the skin every morning. 03/02/15  Yes Elayne Snare, MD  insulin regular human CONCENTRATED (HUMULIN R) 500 UNIT/ML injection Inject 12 units in the am and 18 units at supper 04/05/15  Yes Elayne Snare, MD  Insulin Syringe-Needle U-100 (INSULIN SYRINGE 1CC/31GX5/16") 31G X 5/16" 1 ML MISC Use to inject insulin 08/12/14  Yes Elayne Snare, MD    levothyroxine (SYNTHROID, LEVOTHROID) 100 MCG tablet TAKE 1 TABLET (100 MCG TOTAL) BY MOUTH DAILY. 10/04/14  Yes Elayne Snare, MD  magnesium citrate SOLN Take 0.5 Bottles by mouth once.   Yes Historical Provider, MD  mineral oil enema Place 1 enema rectally once.   Yes Historical Provider, MD  NOVOFINE 32G X 6 MM MISC USE 4 PEN NEEDLES PER DAY TO INJECT INSULIN 08/27/14  Yes Elayne Snare, MD  OVER THE COUNTER MEDICATION Take 1 tablet by mouth daily. OTC DiaVite   Yes Historical Provider, MD  oxyCODONE-acetaminophen (ROXICET) 5-325 MG per tablet Take 1-2 tablets by mouth every 4 (four) hours as needed for severe pain. 04/28/15  Yes Coralie Keens, MD  polyethylene glycol (MIRALAX / GLYCOLAX) packet Take 17 g by mouth daily.   Yes Historical Provider, MD    Physical Exam: BP 105/65 mmHg  Pulse 94  Temp(Src) 98.9 F (37.2 C) (Oral)  Resp 14  Ht 6' (1.829 m)  Wt 136.533 kg (301 lb)  BMI 40.81 kg/m2  SpO2 90%  General: Middle-aged Caucasian male. Awake and alert and oriented x3. No acute cardiopulmonary distress.  Eyes: Pupils equal, round, reactive to light. Extraocular muscles are intact. Sclerae anicteric and noninjected.  ENT:  Moist mucosal membranes. No mucosal lesions. Teeth in good repair Neck: Neck supple without lymphadenopathy. No carotid bruits. No masses palpated.  Cardiovascular: Regular rate with normal S1-S2 sounds. No murmurs, rubs, gallops auscultated. No JVD.  Respiratory: Good respiratory effort with no wheezes, rales, rhonchi. Lungs clear to auscultation bilaterally.  Abdomen: Obese. Soft, mild tenderness, nondistended. Hypoactive bowel sounds. No masses or hepatosplenomegaly.  There is firmness around the umbilicus proximally 57 m in diameter. His abdominal incisions are well-healed, nonerythematous, and with without signs of dehiscence  Skin: Dry, warm to touch. 2+ dorsalis pedis and radial pulses. Musculoskeletal: No calf or leg pain. All major joints not erythematous  nontender.  Psychiatric: Intact judgment and insight.  Neurologic: No focal neurological deficits. Cranial nerves II through XII are grossly intact.           Labs on Admission:  Basic Metabolic Panel:  Recent Labs Lab 04/28/15 1028 05/02/15 0822 05/04/15 0047  NA 136  --  137  K 5.4*  --  4.9  CL  --   --  91*  CO2  --   --  28  GLUCOSE 134* 136* 164*  BUN  --   --  30*  CREATININE  --   --  10.73*  CALCIUM  --   --  10.1   Liver Function Tests:  Recent Labs Lab 05/04/15 0047  AST 21  ALT 6*  ALKPHOS 59  BILITOT 0.6  PROT 7.7  ALBUMIN 4.0   No results for input(s): LIPASE, AMYLASE in the last 168 hours. No results for input(s): AMMONIA in the last 168 hours. CBC:  Recent Labs Lab 04/28/15 1028 05/04/15 0047  WBC  --  13.0*  NEUTROABS  --  10.6*  HGB 10.5* 11.7*  HCT 31.0* 36.2*  MCV  --  100.3*  PLT  --  296   Cardiac Enzymes: No results for input(s): CKTOTAL, CKMB, CKMBINDEX, TROPONINI in the last 168 hours.  BNP (last 3 results) No results for input(s): BNP in the last 8760 hours.  ProBNP (last 3 results) No results for input(s): PROBNP in the last 8760 hours.  CBG:  Recent Labs Lab 04/28/15 1216  GLUCAP 101*    Radiological Exams on Admission: Ct Abdomen Pelvis Wo Contrast  05/04/2015   CLINICAL DATA:  Initial evaluation for acute diffuse abdominal pain with nausea and vomiting.  EXAM: CT ABDOMEN AND PELVIS WITHOUT CONTRAST  TECHNIQUE: Multidetector CT imaging of the abdomen and pelvis was performed following the standard protocol without IV contrast.  COMPARISON:  Prior radiograph from 02/23/2015  FINDINGS: Bibasilar atelectatic changes seen dependently within the visualized lung bases. Prominent extrapleural fat. Visualized lung bases are otherwise unremarkable.  Limited noncontrast evaluation of the liver is unremarkable. Gallbladder within normal limits. No biliary dilatation. Spleen, adrenal glands, and pancreas are within normal limits.   Right kidney is atrophic in appearance with scattered small hypodensities, not well evaluated on this noncontrast examination. Patient is status post left oophorectomy. 2 cm nodular density within the left renal fossa is not significantly changed relative to previous.  Stomach is mildly distended with a error and  enteric contrast material within the gastric lumen. Duodenum sweep within normal limits. There are multiple dilated loops of proximal small bowel within the mid and right abdomen with associated air-fluid levels. Loops measure up to 4.1 cm in diameter. There is a transition point within the mid abdomen. There is an adjacent ovoid well-circumscribed hypodense collection at this site that measures 3.2 x 6.5 x 5.6 cm (series 201, image 57). This is positioned just beneath the elbow like is, and may be reflect a seroma related to prior umbilical hernia repair. Distally, the small bowel is decompressed without acute inflammatory changes. Appendix is normal. Colon is decompressed. Few scattered noninflamed colonic diverticula noted within the descending colon.  Bladder within normal limits.  Prostate normal.  Small bilateral fat containing inguinal hernias noted.  No free intraperitoneal air. Small volume free fluid along the right pericolic gutter. No pathologically enlarged intra-abdominal pelvic lymph nodes. Moderate atheromatous plaque present within the intra-abdominal aorta. No aneurysm.  No acute osseous abnormality. No worrisome lytic or blastic osseous lesion.  Injection sites noted within the subcu fat of the anterior abdomen.  IMPRESSION: 1. Findings consistent with acute small bowel obstruction with transition point at the level of the umbilicus. There is a 3.2 x 6.5 x 5.6 cm hypodense collection at the transition point, which may reflect a postoperative seroma related to prior umbilical hernia repair. 2. No other acute intra-abdominal or pelvic process. 3. Mild colonic diverticulosis without acute  diverticulitis. 4. Status post left nephrectomy.   Electronically Signed   By: Jeannine Boga M.D.   On: 05/04/2015 06:45    EKG: Independently reviewed. Normal sinus rhythm. Normal intervals. No ST changes. No STEMI  Assessment/Plan Present on Admission:  . SBO (small bowel obstruction) . Severe obstructive sleep apnea  This patient was discussed with the ED physician, including pertinent vitals, physical exam findings, labs, and imaging.  We also discussed care given by the ED provider.  #1 small bowel obstruction  Admit  NG tube  Nothing by mouth (may take oral medications, will need to clamp for 30-45 minutes following medication administration)  Surgery possibly later this afternoon #2 hemodialysis  We'll arrange dialysis later today #3 type 2 diabetes  As the patient is nothing by mouth, will cut the patient's Lantus in half and cover with sliding scale insulin every 6 hours   CBGs every 6 hours #4 hypothyroidism  We'll change the patient to IV levothyroxine #5 obstructive sleep apnea  BiPAP at night  DVT prophylaxis: SCDs as the patient will be undergoing surgery later today  Consultants: Gen. surgery has been consulted. Will consult nephrology for hemodialysis  Code Status: Full code  Family Communication: None   Disposition Plan: Pending   Truett Mainland, DO [facility] Pager [phone]

## 2015-05-04 NOTE — ED Notes (Signed)
Pt. reports emesis with constipation onset this week unrelieved by OTC medications and Miralax. Hernia repair surgery and peritoneal dialysis removal last month. Denies fever or chills.

## 2015-05-04 NOTE — ED Notes (Signed)
Called CT to inform pt has completed oral contrast. CT en route

## 2015-05-04 NOTE — Transfer of Care (Signed)
Immediate Anesthesia Transfer of Care Note  Patient: Robert [name]  Procedure(s) Performed: Procedure(s): LAPAROSCOPY DIAGNOSTIC LYSIS OF ADHESIONS (N/A)  Patient Location: PACU  Anesthesia Type:General  Level of Consciousness: awake and alert   Airway & Oxygen Therapy: Patient Spontanous Breathing and Patient connected to face mask oxygen  Post-op Assessment: Report given to RN and Post -op Vital signs reviewed and stable  Post vital signs: Reviewed and stable  Last Vitals:  Filed Vitals:   05/04/15 1133  BP: 121/67  Pulse: 84  Temp: 36.8 C  Resp: 16    Complications: No apparent anesthesia complications

## 2015-05-04 NOTE — Anesthesia Preprocedure Evaluation (Addendum)
Anesthesia Evaluation  Patient identified by MRN, date of birth, ID band Patient awake    Reviewed: Allergy & Precautions, NPO status , Patient's Chart, lab work & pertinent test results, reviewed documented beta blocker date and time   History of Anesthesia Complications Negative for: history of anesthetic complications  Airway Mallampati: III  TM Distance: >3 FB Neck ROM: Full    Dental  (+) Dental Advisory Given, Teeth Intact   Pulmonary shortness of breath, sleep apnea and Continuous Positive Airway Pressure Ventilation , former smoker,    Pulmonary exam normal       Cardiovascular hypertension, Pt. on medications and Pt. on home beta blockers Normal cardiovascular exam+ dysrhythmias Supra Ventricular Tachycardia Rhythm:Regular Rate:Normal     Neuro/Psych  Headaches, negative psych ROS   GI/Hepatic Neg liver ROS, GERD-  ,  Endo/Other  diabetes, Type 2, Insulin DependentHypothyroidism Morbid obesity  Renal/GU ESRF and DialysisRenal diseaseMWF Last HD Monday 7/25 pm.     Musculoskeletal   Abdominal   Peds  Hematology  (+) anemia ,   Anesthesia Other Findings   Reproductive/Obstetrics                           Anesthesia Physical  Anesthesia Plan  ASA: III  Anesthesia Plan: General   Post-op Pain Management:    Induction: Intravenous  Airway Management Planned: Oral ETT  Additional Equipment: None  Intra-op Plan:   Post-operative Plan: Extubation in OR  Informed Consent: I have reviewed the patients History and Physical, chart, labs and discussed the procedure including the risks, benefits and alternatives for the proposed anesthesia with the patient or authorized representative who has indicated his/her understanding and acceptance.   Dental advisory given  Plan Discussed with: CRNA  Anesthesia Plan Comments:        Anesthesia Quick Evaluation

## 2015-05-04 NOTE — Op Note (Signed)
LAPAROSCOPY DIAGNOSTIC LYSIS OF ADHESIONS  Procedure Note  JT HAEFS 05/04/2015   Pre-op Diagnosis: sbo s/p umbilical hernia repair     Post-op Diagnosis: same  Procedure(s): LAPAROSCOPY DIAGNOSTIC LYSIS OF ADHESIONS  Surgeon(s): Coralie Keens, MD  Anesthesia: General  Staff:  Circulator: Donneta Romberg, RN Scrub Person: Theora Master Sipsis, RN; Jon Gills Kretschmaier  Estimated Blood Loss: Minimal               Findings:  Omentum and small bowel loosely adherent to mesh but came off it easily.  No evidence of bowel stricture or bowel injury.          Kandy Towery A   Date: 05/04/2015  Time: 2:16 PM

## 2015-05-04 NOTE — Progress Notes (Signed)
Pt refused BiPAP for the night due to NG, stating it would be too uncomfortable. RT informed pt to call for RT if he changes his mind during the night

## 2015-05-04 NOTE — ED Provider Notes (Signed)
CSN: [id number]     Arrival date & time 05/04/15  0033 History  This chart was scribed for Ripley Fraise, MD by Julien Nordmann, ED Scribe. This patient was seen in room A08C/A08C and the patient's care was started at 3:39 AM.    Chief Complaint  Patient presents with  . Emesis  . Constipation      The history is provided by the patient. No language interpreter was used.   HPI Comments: Robert [name] is a 54 y.o. male who presents to the Emergency Department complaining of intermittent, gradual worsening emesis onset 2 days ago. He has associated constipation and loss of appetite. He reports his vomit went from green to brown, and now it is black. Pt had a peritoneal dialysis removed and a hernia repair recently and states his symptoms started after. Pt notes he feels like he may be impacted. He has taken OTC miralax and other laxatives to alleviate the constipation with no relief. Pt currently takes aspirin. Pt denies fever, cp, and sob. He is due dialysis later today  Past Medical History  Diagnosis Date  . Diabetes mellitus without complication   . Thyroid disease   . Hyperlipidemia   . PAT (paroxysmal atrial tachycardia)   . PSVT (paroxysmal supraventricular tachycardia)   . Blood transfusion without reported diagnosis 1974    kidney removed - L  . CPAP (continuous positive airway pressure) dependence   . Dysrhythmia   . Shortness of breath   . Hypothyroidism   . GERD (gastroesophageal reflux disease)     n/v a lot  . Headache(784.0)     slight dull h/a due kidney failure  . Anemia     low hgb at present  . Umbilical hernia     will repair with thi surgery  . Neuropathy     right hand and both feet  . Constipation   . Macular degeneration   . Eczema   . Sleep apnea 06/18/11    [facility] BiPAP- no oxygen  . Chronic kidney disease     Stage IV, Richarda Blade. M,W,F  . Cancer     skin ca right shoulder, plastic  dsyplasia, pre-Ca polpys removed on Colonoscopy- 07/2014  . Hypertension     SVT h/o , followed by Dr. Claiborne Billings   Past Surgical History  Procedure Laterality Date  . Nephrectomy Left 1974  . Renal biopsy Right 2012  . Skin cancer excision      right shoulder  . Capd insertion N/A 05/18/2014    Procedure: LAPAROSCOPIC INSERTION CONTINUOUS AMBULATORY PERITONEAL DIALYSIS  (CAPD) CATHETER;  Surgeon: Ralene Ok, MD;  Location: [place];  Service: General;  Laterality: N/A;  . Umbilical hernia repair N/A 05/18/2014    Procedure: HERNIA REPAIR UMBILICAL ADULT;  Surgeon: Ralene Ok, MD;  Location: [place];  Service: General;  Laterality: N/A;  . Laparoscopic repositioning capd catheter N/A 06/16/2014    Procedure: LAPAROSCOPIC REPOSITIONING CAPD CATHETER;  Surgeon: Ralene Ok, MD;  Location: [place];  Service: General;  Laterality: N/A;  . Ij catheter insertion    . Colonoscopy    . Insertion of dialysis catheter Right 02/25/2015    Procedure: INSERTION OF RIGHT INTERNAL JUGULAR DIALYSIS CATHETER;  Surgeon: Serafina Mitchell, MD;  Location: [place];  Service: Vascular;  Laterality: Right;  . Av fistula placement Right 02/25/2015    Procedure: RIGHT ARTERIOVENOUS (AV) FISTULA CREATION;  Surgeon: Serafina Mitchell, MD;  Location: [place];  Service: Vascular;  Laterality: Right;  . Umbilical hernia repair N/A 04/28/2015    Procedure: UMBILICAL HERNIA REPAIR WITH MESH;  Surgeon: Coralie Keens, MD;  Location: [place];  Service: General;  Laterality: N/A;  . Minor removal of peritoneal dialysis catheter N/A 04/28/2015    Procedure:  REMOVAL OF PERITONEAL DIALYSIS CATHETER;  Surgeon: Coralie Keens, MD;  Location: [place];  Service: General;  Laterality: N/A;   Family History  Problem Relation Age of Onset  . Colon polyps Father   . Colon cancer Neg Hx   . Stomach cancer Neg Hx    History  Substance Use Topics  . Smoking status: Former Smoker    Types: Pipe    Quit date: 12/06/2013  . Smokeless tobacco:  Never Used  . Alcohol Use: 0.6 oz/week    1 Glasses of wine per week     Comment: 1 glass of wine per week, rare use as of 01/2015    Review of Systems  Constitutional: Positive for appetite change. Negative for fever.  Respiratory: Negative for shortness of breath.   Cardiovascular: Negative for chest pain.  Gastrointestinal: Positive for vomiting and constipation.  All other systems reviewed and are negative.     Allergies  Review of patient's allergies indicates no known allergies.  Home Medications   Prior to Admission medications   Medication Sig Start Date End Date Taking? Authorizing Provider  aspirin 81 MG tablet Take 81 mg by mouth every morning.     Historical Provider, MD  BYSTOLIC 5 MG tablet Take 5 mg by mouth daily. 04/04/15   Historical Provider, MD  CALCITRIOL PO Take 1 tablet by mouth 3 (three) times a week.    Historical Provider, MD  calcium acetate (PHOSLO) 667 MG capsule Take 667 mg by mouth 3 (three) times daily with meals.    Historical Provider, MD  cetirizine (ZYRTEC) 10 MG tablet Take 10 mg by mouth every morning.     Historical Provider, MD  CRESTOR 20 MG tablet TAKE 1 TABLET (20 MG TOTAL) BY MOUTH DAILY. 10/04/14   Elayne Snare, MD  docusate sodium (COLACE) 100 MG capsule Take 100 mg by mouth every evening.     Historical Provider, MD  fluocinonide cream (LIDEX) [id number] % Apply 1 application topically daily as needed (Skin inflammation (hand, thigh, back)).     Historical Provider, MD  FOSRENOL 1000 MG chewable tablet Chew 1,000-2,000 mg by mouth 4 (four) times daily. 2000 mg with every meal and 1000 mg with snack 12/29/14   Historical Provider, MD  gentamicin cream (GARAMYCIN) 0.1 % Apply 1 application topically daily. Apply weekly to peritoneal catheter 06/02/14   Historical Provider, MD  glucose blood (ONETOUCH VERIO) test strip Use as instructed to check blood sugar 6 a day dx code E11.29 08/04/14   Elayne Snare, MD  Insulin Glargine (LANTUS SOLOSTAR) 100  UNIT/ML Solostar Pen Inject 30 Units into the skin every morning. 03/02/15   Elayne Snare, MD  insulin regular human CONCENTRATED (HUMULIN R) 500 UNIT/ML injection Inject 12 units in the am and 18 units at supper 04/05/15   Elayne Snare, MD  Insulin Syringe-Needle U-100 (INSULIN SYRINGE 1CC/31GX5/16") 31G X 5/16" 1 ML MISC Use to inject insulin 08/12/14   Elayne Snare, MD  levothyroxine (SYNTHROID, LEVOTHROID) 100 MCG tablet TAKE 1 TABLET (100 MCG TOTAL) BY MOUTH DAILY. 10/04/14   Elayne Snare, MD  nebivolol (BYSTOLIC) 10 MG tablet Take 10 mg by mouth daily.    Historical Provider, MD  NOVOFINE 32G X 6 MM MISC USE 4 PEN NEEDLES PER DAY TO INJECT INSULIN 08/27/14   Elayne Snare, MD  OVER THE COUNTER MEDICATION Take 1 tablet by mouth daily. OTC DiaVite    Historical Provider, MD  oxyCODONE-acetaminophen (ROXICET) 5-325 MG per tablet Take 1-2 tablets by mouth every 4 (four) hours as needed for severe pain. 04/28/15   Coralie Keens, MD  polyethylene glycol [facility] / Floria Raveling) packet Take 17 g by mouth daily.    Historical Provider, MD   Triage vitals: BP 119/104 mmHg  Pulse 103  Temp(Src) 99.2 F (37.3 C) (Oral)  Resp 14  Ht 6' (1.829 m)  Wt 301 lb (136.533 kg)  BMI 40.81 kg/m2  SpO2 97% Physical Exam CONSTITUTIONAL: Well developed/well nourished HEAD: Normocephalic/atraumatic EYES: EOMI/PERRL ENMT: Mucous membranes moist NECK: supple no meningeal signs SPINE/BACK:entire spine nontender CV: S1/S2 noted LUNGS: Lungs are clear to auscultation bilaterally, no apparent distress ABDOMEN: soft, mild diffuse tenderness well healing incisions noted, no rebound or guarding, decreased BS noted throughout, he is distended GU:no cva tenderness Rectal - stool color brown without blood/melena, no impaction, male chaperone present for exam NEURO: Pt is awake/alert/appropriate, moves all extremitiesx4.  No facial droop.   EXTREMITIES: pulses normal/equal, full ROM SKIN: warm, color normal, dialysis catheter  right upper chest PSYCH: no abnormalities of mood noted, alert and oriented to situation  ED Course  Procedures  DIAGNOSTIC STUDIES: Oxygen Saturation is 97% on RA, normal by my interpretation. 4:03 AM Pt with recent removal of PD catheter and hernia repair here with nausea/vomiting and also constipation and distention Concern for bowel obstruction Will obtain CT scan He reports good response to lactulose in the past with constipation but will defer further tx for now until after CT scan 6:52 AM Pt with increased nausea CT scan shows obstruction Will call surgery 7:29 AM D/w gen surgery PA Claiborne Billings She will d/w dr Ninfa Linden She recommends [facility] hospitalist admit due to h/o renal failure and need for dialysis later today NG tube ordered 8:05 AM D/w dr Nehemiah Settle Will admit to medical service He will need dialysis later today  Labs Review Labs Reviewed  CBC WITH DIFFERENTIAL/PLATELET - Abnormal; Notable for the following:    WBC 13.0 (*)    RBC 3.61 (*)    Hemoglobin 11.7 (*)    HCT 36.2 (*)    MCV 100.3 (*)    Neutrophils Relative % 82 (*)    Neutro Abs 10.6 (*)    Lymphocytes Relative 10 (*)    All other components within normal limits  COMPREHENSIVE METABOLIC PANEL - Abnormal; Notable for the following:    Chloride 91 (*)    Glucose, Bld 164 (*)    BUN 30 (*)    Creatinine, Ser 10.73 (*)    ALT 6 (*)    GFR calc non Af Amer 5 (*)    GFR calc Af Amer 5 (*)    Anion gap 18 (*)    All other components within normal limits    Imaging Review Ct Abdomen Pelvis Wo Contrast  05/04/2015   CLINICAL DATA:  Initial evaluation for acute diffuse abdominal pain with nausea and vomiting.  EXAM: CT ABDOMEN AND PELVIS WITHOUT CONTRAST  TECHNIQUE: Multidetector CT imaging of the abdomen and pelvis was performed following the standard protocol without IV contrast.  COMPARISON:  Prior radiograph from 02/23/2015  FINDINGS: Bibasilar atelectatic changes seen dependently within the visualized  lung bases. Prominent extrapleural fat. Visualized lung bases are otherwise unremarkable.  Limited noncontrast evaluation of the liver is unremarkable. Gallbladder within normal limits. No biliary dilatation. Spleen, adrenal glands, and pancreas are within normal limits.  Right kidney is atrophic in appearance with scattered small hypodensities, not well evaluated on this noncontrast examination. Patient is status post left oophorectomy. 2 cm nodular density within the left renal fossa is not significantly changed relative to previous.  Stomach is mildly distended with a error and enteric contrast material within the gastric lumen. Duodenum sweep within normal limits. There are multiple dilated loops of proximal small bowel within the mid and right abdomen with associated air-fluid levels. Loops measure up to 4.1 cm in diameter. There is a transition point within the mid abdomen. There is an adjacent ovoid well-circumscribed hypodense collection at this site that measures 3.2 x 6.5 x 5.6 cm (series 201, image 57). This is positioned just beneath the elbow like is, and may be reflect a seroma related to prior umbilical hernia repair. Distally, the small bowel is decompressed without acute inflammatory changes. Appendix is normal. Colon is decompressed. Few scattered noninflamed colonic diverticula noted within the descending colon.  Bladder within normal limits.  Prostate normal.  Small bilateral fat containing inguinal hernias noted.  No free intraperitoneal air. Small volume free fluid along the right pericolic gutter. No pathologically enlarged intra-abdominal pelvic lymph nodes. Moderate atheromatous plaque present within the intra-abdominal aorta. No aneurysm.  No acute osseous abnormality. No worrisome lytic or blastic osseous lesion.  Injection sites noted within the subcu fat of the anterior abdomen.  IMPRESSION: 1. Findings consistent with acute small bowel obstruction with transition point at the level of  the umbilicus. There is a 3.2 x 6.5 x 5.6 cm hypodense collection at the transition point, which may reflect a postoperative seroma related to prior umbilical hernia repair. 2. No other acute intra-abdominal or pelvic process. 3. Mild colonic diverticulosis without acute diverticulitis. 4. Status post left nephrectomy.   Electronically Signed   By: Jeannine Boga M.D.   On: 05/04/2015 06:45     EKG Interpretation   Date/Time:  Wednesday May 04 2015 03:02:26 EDT Ventricular Rate:  87 PR Interval:  145 QRS Duration: 80 QT Interval:  395 QTC Calculation: 475 R Axis:   18 Text Interpretation:  Sinus rhythm Borderline prolonged QT interval  Baseline wander in lead(s) V2 V6 artifact noted No significant change  since last tracing Confirmed by Christy Gentles  MD, Elenore Rota ([id number]) on 05/04/2015  3:15:09 AM      MDM   Final diagnoses:  Abdominal pain  Small bowel obstruction  Chronic renal failure, unspecified stage    Nursing notes including past medical history and social history reviewed and considered in documentation Labs/vital reviewed myself and considered during evaluation Previous records reviewed and considered   I personally performed the services described in this documentation, which was scribed in my presence. The recorded information has been reviewed and is accurate.     Ripley Fraise, MD 05/04/15 [phone]

## 2015-05-04 NOTE — ED Notes (Signed)
Called CT, patient is dialysis patient. Patient does not need an IV for ct testing, plan for drinking contrast for 2 hours.

## 2015-05-04 NOTE — Consult Note (Signed)
  This gentleman is well-known to me  He is status post PD catheter removal as well as repair of a small umbilical hernia with mesh last week. He now presents with nausea and vomiting. This is been occurring for 2 days. He has also been constipated. He has multiple chronic medical problems and is currently on hemodialysis.  A CT scan of the abdomen and pelvis demonstrates a partial bowel obstruction. The source may be at the umbilicus. There is a seroma at the mesh which is expected. His abdomen is soft and nontender. It is morbidly obese.  Given the CT findings, I will have to prove that the umbilical hernia repair is not the source of the obstruction. My plan would be to proceed with a diagnostic laparoscopy to see if I can determine the source without having to explore his abdomen with an open incision at the umbilicus and remove the mesh. I discussed the risks of this with him. These would include but are not limited to bleeding, infection, injury to the bowel, need for formal exploration, etc. I believe he does need a nasogastric tube placed. I believe he needs medical admission given his comorbidities and potential need for dialysis today. Hopefully I can proceed to the operating room this afternoon.la  Amire Leazer A. Ninfa Linden, MD

## 2015-05-04 NOTE — Consult Note (Signed)
[name] Renal Consultation Note    Indication for Consultation:  Management of ESRD/hemodialysis; anemia, hypertension/volume and secondary hyperparathyroidism PCP: [name]  HPI: Robert [name] is a very pleasant 54 y.o. male with ESRD who receives hemodialysis MWF at [name]. Past medical history significant for DM, HTN, Obstructive sleep apnea, morbid obesity, hypothyroidism, umbilical hernia, neuropathy, constipation, anemia, GERD, Macular degeneration.   Robert [name] present to the ED with C/O black emesis and constipation following removal of peritoneal catheter and umbilical hernia repair with [name]. Patient states initially he did [name] then two days ago developed nausea and vomiting at least twice a day with black emesis. Patient states he has had loss of appetite and concurrent constipation for which he took a plethora of OTC laxatives without results.  States he had sm. Hard stools, but no relief of constipation symptoms. CT of pelvis revealed findings consistent with acute small bowel obstruction with transition point at the level of the umbilicus. Plans are for diagnostic laparoscopy  Robert [name] later today.  Patient was a peritoneal HD patient until April of this year when difficulties with PD catheter required him to return to hemodialysis. Patient has occasional high intradialytic gains otherwise appears compliant with HD prescription. Last incenter values are as follows: Hgb 9.6 (04/27/15) Phos 8.4 Ca 9.3 C Ca 9.1 PTH 592. (04/20/2015).   Past Medical History  Diagnosis Date  . Diabetes mellitus without complication   . Thyroid disease   . Hyperlipidemia   . PAT (paroxysmal atrial tachycardia)   . PSVT (paroxysmal supraventricular tachycardia)   . Blood transfusion without reported diagnosis 1974    kidney removed - L  . CPAP (continuous positive airway pressure) dependence   .  Dysrhythmia   . Shortness of breath   . Hypothyroidism   . GERD (gastroesophageal reflux disease)     n/v a lot  . Headache(784.0)     slight dull h/a due kidney failure  . Anemia     low hgb at present  . Umbilical hernia     will repair with thi surgery  . Neuropathy     right hand and both feet  . Constipation   . Macular degeneration   . Eczema   . Sleep apnea 06/18/11    split-night sleep study- [facility] BiPAP- no oxygen  . Chronic kidney disease     Stage IV, Robert [name]. M,W,F  . Cancer     skin ca right shoulder, plastic dsyplasia, pre-Ca polpys removed on Colonoscopy- 07/2014  . Hypertension     SVT h/o , followed by [name]   Past Surgical History  Procedure Laterality Date  . Nephrectomy Left 1974  . Renal biopsy Right 2012  . Skin cancer excision      right shoulder  . Capd insertion N/A 05/18/2014    Procedure: LAPAROSCOPIC INSERTION CONTINUOUS AMBULATORY PERITONEAL DIALYSIS  (CAPD) CATHETER;  Surgeon: Robert Ok, MD;  Location: [place];  Service: General;  Laterality: N/A;  . Umbilical hernia repair N/A 05/18/2014    Procedure: HERNIA REPAIR UMBILICAL ADULT;  Surgeon: Robert Ok, MD;  Location: [place];  Service: General;  Laterality: N/A;  . Laparoscopic repositioning capd catheter N/A 06/16/2014    Procedure: LAPAROSCOPIC REPOSITIONING CAPD CATHETER;  Surgeon: Robert Ok, MD;  Location: [place];  Service: General;  Laterality: N/A;  . Ij catheter insertion    . Colonoscopy    .  Insertion of dialysis catheter Right 02/25/2015    Procedure: INSERTION OF RIGHT INTERNAL JUGULAR DIALYSIS CATHETER;  Surgeon: Robert Mitchell, MD;  Location: [place];  Service: Vascular;  Laterality: Right;  . Av fistula placement Right 02/25/2015    Procedure: RIGHT ARTERIOVENOUS (AV) FISTULA CREATION;  Surgeon: Robert Mitchell, MD;  Location: [facility];  Service: Vascular;  Laterality: Right;  . Umbilical hernia repair N/A 04/28/2015    Procedure: UMBILICAL  HERNIA REPAIR WITH MESH;  Surgeon: Robert Keens, MD;  Location: [place];  Service: General;  Laterality: N/A;  . Minor removal of peritoneal dialysis catheter N/A 04/28/2015    Procedure:  REMOVAL OF PERITONEAL DIALYSIS CATHETER;  Surgeon: Robert Keens, MD;  Location: [place];  Service: General;  Laterality: N/A;   Family History  Problem Relation Age of Onset  . Colon polyps Father   . Colon cancer Neg Hx   . Stomach cancer Neg Hx    Social History:  reports that he quit smoking about 16 months ago. His smoking use included Pipe. He has never used smokeless tobacco. He reports that he drinks about 0.6 oz of alcohol Robert week. He reports that he does not use illicit drugs. No Known Allergies Prior to Admission medications   Medication Sig Start Date End Date Taking? Authorizing Provider  aspirin 81 MG tablet Take 81 mg by mouth every morning.    Yes Historical Provider, MD  BYSTOLIC 5 MG tablet Take 15 mg by mouth daily.  04/04/15  Yes Historical Provider, MD  CALCITRIOL PO Take 3 tablets by mouth 3 (three) times a week. Monday, Wednesday and Friday.   Yes Historical Provider, MD  calcium acetate (PHOSLO) 667 MG capsule Take 667 mg by mouth 3 (three) times daily with meals.   Yes Historical Provider, MD  cetirizine (ZYRTEC) 10 MG tablet Take 10 mg by mouth every morning.    Yes Historical Provider, MD  docusate sodium (COLACE) 100 MG capsule Take 100 mg by mouth every evening.    Yes Historical Provider, MD  fluocinonide cream (LIDEX) [id number] % Apply 1 application topically daily as needed (Skin inflammation (hand, thigh, back)).    Yes Historical Provider, MD  FOSRENOL 1000 MG chewable tablet Chew 1,000-2,000 mg by mouth 4 (four) times daily. 2000 mg with every meal and 1000 mg with snack 12/29/14  Yes Historical Provider, MD  glucose blood (ONETOUCH VERIO) test strip Use as instructed to check blood sugar 6 a day dx code E11.29 08/04/14  Yes Elayne Snare, MD  Insulin Glargine (LANTUS SOLOSTAR)  100 UNIT/ML Solostar Pen Inject 30 Units into the skin every morning. 03/02/15  Yes Elayne Snare, MD  insulin regular human CONCENTRATED (HUMULIN R) 500 UNIT/ML injection Inject 12 units in the am and 18 units at supper 04/05/15  Yes Elayne Snare, MD  Insulin Syringe-Needle U-100 (INSULIN SYRINGE 1CC/31GX5/16") 31G X 5/16" 1 ML MISC Use to inject insulin 08/12/14  Yes Elayne Snare, MD  levothyroxine (SYNTHROID, LEVOTHROID) 100 MCG tablet TAKE 1 TABLET (100 MCG TOTAL) BY MOUTH DAILY. 10/04/14  Yes Elayne Snare, MD  magnesium citrate SOLN Take 0.5 Bottles by mouth once.   Yes Historical Provider, MD  mineral oil enema Place 1 enema rectally once.   Yes Historical Provider, MD  NOVOFINE 32G X 6 MM MISC USE 4 PEN NEEDLES Robert DAY TO INJECT INSULIN 08/27/14  Yes Elayne Snare, MD  OVER THE COUNTER MEDICATION Take 1 tablet by mouth daily. OTC DiaVite   Yes Historical Provider,  MD  oxyCODONE-acetaminophen (ROXICET) 5-325 MG Robert tablet Take 1-2 tablets by mouth every 4 (four) hours as needed for severe pain. 04/28/15  Yes Robert Keens, MD  polyethylene glycol (MIRALAX / GLYCOLAX) packet Take 17 g by mouth daily.   Yes Historical Provider, MD   Current Facility-Administered Medications  Medication Dose Route Frequency Provider Last Rate Last Dose  . 0.9 %  sodium chloride infusion   Intravenous Continuous Tanna Savoy Stinson, DO      . acetaminophen (TYLENOL) tablet 650 mg  650 mg Oral Q6H PRN Truett Mainland, DO       Or  . acetaminophen (TYLENOL) suppository 650 mg  650 mg Rectal Q6H PRN Tanna Savoy Stinson, DO      . alum & mag hydroxide-simeth (MAALOX/MYLANTA) 200-200-20 MG/5ML suspension 30 mL  30 mL Oral Q6H PRN Truett Mainland, DO      . aspirin tablet 81 mg  81 mg Oral q morning - 10a Tanna Savoy Stinson, DO      . calcitRIOL (ROCALTROL) capsule 0.5 mcg  0.5 mcg Oral Once Robert day on Mon Wed Fri Tanna Savoy Stinson, DO      . calcium acetate (PHOSLO) capsule 667 mg  667 mg Oral TID WC Tanna Savoy Stinson, DO      . ceFAZolin  (ANCEF) IVPB 2 g/50 mL premix  2 g Intravenous To SS-Surg Robert Keens, MD      . fluocinonide cream (LIDEX) [id number] % 1 application  1 application Topical Daily PRN Tanna Savoy Stinson, DO      . insulin aspart (novoLOG) injection 0-15 Units  0-15 Units Subcutaneous TID WC Tanna Savoy Stinson, DO      . Insulin Glargine (LANTUS) Solostar Pen 15 Units  15 Units Subcutaneous BH-q7a Tanna Savoy [place], DO      . lanthanum [facility]) chewable tablet 1,000-2,000 mg  1,000-2,000 mg Oral QID Tanna Savoy Stinson, DO      . levothyroxine (SYNTHROID, LEVOTHROID) injection 50 mcg  50 mcg Intravenous Daily Tanna Savoy Stinson, DO      . loratadine (CLARITIN) tablet 10 mg  10 mg Oral Daily Tanna Savoy Stinson, DO      . nebivolol (BYSTOLIC) tablet 15 mg  15 mg Oral Daily Tanna Savoy Stinson, DO      . promethazine (PHENERGAN) tablet 12.5 mg  12.5 mg Oral Q6H PRN Truett Mainland, DO       Labs: Basic Metabolic Panel:  Recent Labs Lab 04/28/15 1028 05/02/15 0822 05/04/15 0047  NA 136  --  137  K 5.4*  --  4.9  CL  --   --  91*  CO2  --   --  28  GLUCOSE 134* 136* 164*  BUN  --   --  30*  CREATININE  --   --  10.73*  CALCIUM  --   --  10.1   Liver Function Tests:  Recent Labs Lab 05/04/15 0047  AST 21  ALT 6*  ALKPHOS 59  BILITOT 0.6  PROT 7.7  ALBUMIN 4.0   No results for input(s): LIPASE, AMYLASE in the last 168 hours. No results for input(s): AMMONIA in the last 168 hours. CBC:  Recent Labs Lab 04/28/15 1028 05/04/15 0047  WBC  --  13.0*  NEUTROABS  --  10.6*  HGB 10.5* 11.7*  HCT 31.0* 36.2*  MCV  --  100.3*  PLT  --  296   Cardiac Enzymes: No results for input(s): CKTOTAL, CKMB, CKMBINDEX,  TROPONINI in the last 168 hours. CBG:  Recent Labs Lab 04/28/15 1216  GLUCAP 101*   Iron Studies: No results for input(s): IRON, TIBC, TRANSFERRIN, FERRITIN in the last 72 hours. Studies/Results: Ct Abdomen Pelvis Wo Contrast  05/04/2015   CLINICAL DATA:  Initial evaluation for acute diffuse abdominal  pain with nausea and vomiting.  EXAM: CT ABDOMEN AND PELVIS WITHOUT CONTRAST  TECHNIQUE: Multidetector CT imaging of the abdomen and pelvis was performed following the standard protocol without IV contrast.  COMPARISON:  Prior radiograph from 02/23/2015  FINDINGS: Bibasilar atelectatic changes seen dependently within the visualized lung bases. Prominent extrapleural fat. Visualized lung bases are otherwise unremarkable.  Limited noncontrast evaluation of the liver is unremarkable. Gallbladder within normal limits. No biliary dilatation. Spleen, adrenal glands, and pancreas are within normal limits.  Right kidney is atrophic in appearance with scattered small hypodensities, not well evaluated on this noncontrast examination. Patient is status post left oophorectomy. 2 cm nodular density within the left renal fossa is not significantly changed relative to previous.  Stomach is mildly distended with a error and enteric contrast material within the gastric lumen. Duodenum sweep within normal limits. There are multiple dilated loops of proximal small bowel within the mid and right abdomen with associated air-fluid levels. Loops measure up to 4.1 cm in diameter. There is a transition point within the mid abdomen. There is an adjacent ovoid well-circumscribed hypodense collection at this site that measures 3.2 x 6.5 x 5.6 cm (series 201, image 57). This is positioned just beneath the elbow like is, and may be reflect a seroma related to prior umbilical hernia repair. Distally, the small bowel is decompressed without acute inflammatory changes. Appendix is normal. Colon is decompressed. Few scattered noninflamed colonic diverticula noted within the descending colon.  Bladder within normal limits.  Prostate normal.  Small bilateral fat containing inguinal hernias noted.  No free intraperitoneal air. Small volume free fluid along the right pericolic gutter. No pathologically enlarged intra-abdominal pelvic lymph nodes.  Moderate atheromatous plaque present within the intra-abdominal aorta. No aneurysm.  No acute osseous abnormality. No worrisome lytic or blastic osseous lesion.  Injection sites noted within the subcu fat of the anterior abdomen.  IMPRESSION: 1. Findings consistent with acute small bowel obstruction with transition point at the level of the umbilicus. There is a 3.2 x 6.5 x 5.6 cm hypodense collection at the transition point, which may reflect a postoperative seroma related to prior umbilical hernia repair. 2. No other acute intra-abdominal or pelvic process. 3. Mild colonic diverticulosis without acute diverticulitis. 4. Status post left nephrectomy.   Electronically Signed   By: Jeannine Boga M.D.   On: 05/04/2015 06:45    ROS: As Robert HPI otherwise negative.   Physical Exam: Filed Vitals:   05/04/15 0309 05/04/15 0345 05/04/15 0551 05/04/15 0600  BP: 123/79 123/78 107/65 105/65  Pulse: 88 88 93 94  Temp: 98.9 F (37.2 C)     TempSrc: Oral     Resp: 16 17 16 14   Height:      Weight:      SpO2: 95% 94% 91% 90%     General: Well developed, well nourished, in no acute distress. Head: Normocephalic, atraumatic, sclera non-icteric, mucus membranes are moist Neck: Supple. JVD not elevated. Lungs: Clear bilaterally to auscultation without wheezes, rales, or rhonchi. Breathing is unlabored. Heart: RRR with S1 S2. No murmurs, rubs, or gallops appreciated. Abdomen: obese, slightly distended with hypoactive bowel sounds. No rebound/guarding. No obvious abdominal masses.  Has healed incision LUQ of Abd.  2 sm Laparoscopic incisions RUQ, 2 sm Laparoscopic incisions R umbilicus area all intact without purulent drainage. M-S:  Strength and tone appear normal for age. Lower extremities:without edema or ischemic changes, no open wounds  Neuro: Alert and oriented X 3. Moves all extremities spontaneously. Psych:  Responds to questions appropriately with a normal affect. Dialysis Access: R. Tunneled  [employer].  Developing RFA AVF + Bruit. AVF has been slow to develop. Has appt with Dr. Trula Slade  August 18th 2016 for evaluation of  fistula and branch ligation.  Dialysis Orders: Center: [place]  on MWF . EDW 140.5 kg HD Bath 2.0 K 2.0 Ca 1 Mg  Time 4 hours 30 minutes Heparin 14000 units Robert treatment. Access R perm cath Optiflux 200NRe BFR 450 DFR 800   Venofer 100 mcg IV (last dose 05/02/2015) needs 7 more doses Aransep 200 mcg IV weekly (Last dose 04/27/2015)  Calcitriol 1.5 mcg PO MWF (last dose 05/02/2015)  Assessment/Plan: 1.  SBO: Robert primary and surgery. Will have HD today after procedure. No heparin.  2.  ESRD -  MWF at [facility]. Former peritoneal HD patient 3.  Hypertension/volume  - Current wt 136.5 kg is 4 kg below EDW. Will run even today. SBP 120s-110s. Bystolic 15 mg PO daily.  4.  Anemia  - HGB 11.7 today. Will give weekly dose of ESA today with venofer. 5.  Metabolic bone disease -  Ca 10.1 C Ca 10.1 Continue binders when patient is not NPO. 6.  Nutrition - NPO for Surgery. Renal diet when appropriate 7. DM Robert primary 8. OSA: Robert primary. CPAP from home   Wallace. Owens Shark, NP-C 05/04/2015, 10:43 AM  [facility] Beeper [phone]  Pt seen, examined and agree w A/P as above.  Kelly Splinter MD pager [phone]    cell [phone] 05/04/2015, 3:42 PM

## 2015-05-04 NOTE — Progress Notes (Signed)
Report called to short stay RN, Sharyn Lull. Patient going to surgery

## 2015-05-04 NOTE — ED Notes (Signed)
ekg given to Dr. Christy Gentles.

## 2015-05-04 NOTE — Anesthesia Procedure Notes (Signed)
Procedure Name: Intubation Date/Time: 05/04/2015 1:49 PM Performed by: Jenne [facility] Pre-anesthesia Checklist: Patient identified, Emergency Drugs available, Suction available, Patient being monitored and Timeout performed Patient Re-evaluated:Patient Re-evaluated prior to inductionOxygen Delivery Method: Circle system utilized Preoxygenation: Pre-oxygenation with 100% oxygen Intubation Type: IV induction Laryngoscope Size: Miller and 3 Grade View: Grade II Tube type: Oral Tube size: 7.5 mm Number of attempts: 1 Airway Equipment and Method: Stylet Placement Confirmation: ETT inserted through vocal cords under direct vision,  positive ETCO2,  CO2 detector and breath sounds checked- equal and bilateral Secured at: 22 cm Tube secured with: Tape Dental Injury: Teeth and Oropharynx as per pre-operative assessment

## 2015-05-04 NOTE — Anesthesia Postprocedure Evaluation (Signed)
Anesthesia Post Note  Patient: [name]  Procedure(s) Performed: Procedure(s) (LRB): LAPAROSCOPY DIAGNOSTIC LYSIS OF ADHESIONS (N/A)  Anesthesia type: General  Patient location: PACU  Post pain: Pain level controlled  Post assessment: Post-op Vital signs reviewed  Last Vitals: BP 115/55 mmHg  Pulse 93  Temp(Src) 36.9 C (Oral)  Resp 15  Ht 6' (1.829 m)  Wt 299 lb (135.626 kg)  BMI 40.54 kg/m2  SpO2 95%  Post vital signs: Reviewed  Level of consciousness: sedated  Complications: No apparent anesthesia complications

## 2015-05-04 NOTE — ED Notes (Signed)
Pt resting peacefully upon entering room. Pt O2 sats @ 80%, pt in deep sleep. Awakened pt and sats WNL and states he has sleep apnea. Placed on 2L O2 while pt is sleeping. O2 sats at 98%.

## 2015-05-05 ENCOUNTER — Encounter (HOSPITAL_COMMUNITY): Payer: Self-pay | Admitting: Surgery

## 2015-05-05 ENCOUNTER — Ambulatory Visit: Payer: Medicare Other | Admitting: Endocrinology

## 2015-05-05 DIAGNOSIS — Z0289 Encounter for other administrative examinations: Secondary | ICD-10-CM

## 2015-05-05 LAB — RENAL FUNCTION PANEL
Albumin: 3.4 g/dL — ABNORMAL LOW (ref 3.5–5.0)
Anion gap: 16 — ABNORMAL HIGH (ref 5–15)
BUN: 39 mg/dL — ABNORMAL HIGH (ref 6–20)
CO2: 28 mmol/L (ref 22–32)
Calcium: 8.5 mg/dL — ABNORMAL LOW (ref 8.9–10.3)
Chloride: 96 mmol/L — ABNORMAL LOW (ref 101–111)
Creatinine, Ser: 11.41 mg/dL — ABNORMAL HIGH (ref 0.61–1.24)
GFR calc Af Amer: 5 mL/min — ABNORMAL LOW (ref >60)
GFR calc non Af Amer: 4 mL/min — ABNORMAL LOW (ref >60)
Glucose, Bld: 108 mg/dL — ABNORMAL HIGH (ref 65–99)
Phosphorus: 8 mg/dL — ABNORMAL HIGH (ref 2.5–4.6)
Potassium: 4.9 mmol/L (ref 3.5–5.1)
Sodium: 140 mmol/L (ref 135–145)

## 2015-05-05 LAB — BASIC METABOLIC PANEL
Anion gap: 10 (ref 5–15)
BUN: 12 mg/dL (ref 6–20)
CO2: 20 mmol/L — ABNORMAL LOW (ref 22–32)
CREATININE: 0.85 mg/dL (ref 0.61–1.24)
Calcium: 9.2 mg/dL (ref 8.9–10.3)
Chloride: 111 mmol/L (ref 101–111)
Glucose, Bld: 111 mg/dL — ABNORMAL HIGH (ref 65–99)
Potassium: 4.7 mmol/L (ref 3.5–5.1)
Sodium: 141 mmol/L (ref 135–145)

## 2015-05-05 LAB — CBC
HCT: 29.7 % — ABNORMAL LOW (ref 39.0–52.0)
Hemoglobin: 9.5 g/dL — ABNORMAL LOW (ref 13.0–17.0)
MCH: 32.6 pg (ref 26.0–34.0)
MCHC: 32 g/dL (ref 30.0–36.0)
MCV: 102.1 fL — ABNORMAL HIGH (ref 78.0–100.0)
Platelets: 209 K/uL (ref 150–400)
RBC: 2.91 MIL/uL — ABNORMAL LOW (ref 4.22–5.81)
RDW: 15.6 % — ABNORMAL HIGH (ref 11.5–15.5)
WBC: 8.5 K/uL (ref 4.0–10.5)

## 2015-05-05 LAB — GLUCOSE, CAPILLARY
GLUCOSE-CAPILLARY: 89 mg/dL (ref 65–99)
GLUCOSE-CAPILLARY: 90 mg/dL (ref 65–99)
GLUCOSE-CAPILLARY: 93 mg/dL (ref 65–99)
Glucose-Capillary: 100 mg/dL — ABNORMAL HIGH (ref 65–99)
Glucose-Capillary: 107 mg/dL — ABNORMAL HIGH (ref 65–99)

## 2015-05-05 MED ORDER — LIDOCAINE-PRILOCAINE 2.5-2.5 % EX CREA: 1.0000 "application " | TOPICAL_CREAM | CUTANEOUS | Status: DC | PRN

## 2015-05-05 MED ORDER — PROMETHAZINE HCL 25 MG/ML IJ SOLN
12.5000 mg | Freq: Four times a day (QID) | INTRAMUSCULAR | Status: DC | PRN
  Administered 2015-05-05 (×3): 12.5 mg via INTRAVENOUS
  Filled 2015-05-05 (×2): qty 1

## 2015-05-05 MED ORDER — HEPARIN SODIUM (PORCINE) 1000 UNIT/ML DIALYSIS: 1000.0000 [IU] | INTRAMUSCULAR | Status: DC | PRN

## 2015-05-05 MED ORDER — SODIUM CHLORIDE 0.9 % IV SOLN: 100.0000 mL | INTRAVENOUS | Status: DC | PRN

## 2015-05-05 MED ORDER — PENTAFLUOROPROP-TETRAFLUOROETH EX AERO: 1.0000 "application " | INHALATION_SPRAY | CUTANEOUS | Status: DC | PRN

## 2015-05-05 MED ORDER — INSULIN GLARGINE 100 UNIT/ML ~~LOC~~ SOLN
10.0000 [IU] | SUBCUTANEOUS | Status: DC
  Administered 2015-05-06 – 2015-05-08 (×3): 10 [IU] via SUBCUTANEOUS
  Filled 2015-05-05 (×4): qty 0.1

## 2015-05-05 MED ORDER — NEPRO/CARBSTEADY PO LIQD: 237.0000 mL | ORAL | Status: DC | PRN

## 2015-05-05 MED ORDER — ALTEPLASE 2 MG IJ SOLR: 2.0000 mg | Freq: Once | INTRAMUSCULAR | Status: DC | PRN

## 2015-05-05 MED ORDER — METOCLOPRAMIDE HCL 5 MG/ML IJ SOLN
10.0000 mg | Freq: Three times a day (TID) | INTRAMUSCULAR | Status: DC | PRN
  Administered 2015-05-05: 10 mg via INTRAVENOUS
  Filled 2015-05-05: qty 2

## 2015-05-05 MED ORDER — LIDOCAINE HCL (PF) 1 % IJ SOLN: 5.0000 mL | INTRAMUSCULAR | Status: DC | PRN

## 2015-05-05 NOTE — Progress Notes (Signed)
  [facility] Progress Note   Subjective: NG pushed further in and now draining lots of bilious fluid  Filed Vitals:   05/05/15 0923 05/05/15 1000 05/05/15 1030 05/05/15 1100  BP: 133/54 99/58 94/44  103/46  Pulse: 88 87 86 83  Temp:      TempSrc:      Resp:      Height:      Weight:      SpO2:       Exam: Alert, no distress, NG in place No jvd Chest clear bilat RRR no MRG ABd obese, soft +BS , midline wounds are dressed Trace LE edema bilat R forearm AVF +bruit , R IJ TDC intact site Neuro is alert, Ox 3  [facility] MWF  4.5h   2/2 bath  140.5kg   Heparin 14000   R IJ cath (maturing RFA AVF) Venofer 100 x 7 more Aranesp 200 / wk Calcitriol 1.5 ug tiw      Assessment: 1. SBO: per primary and surgery 2. ESRD - HD today (missed Wed) and tomorrow first shift 3. Hypertension/volume - is below dry wt, BP soft. Keep even. Bystolic 15 mg PO daily.  4. Anemia - HGB 11.7 today. Will give weekly dose of ESA today with venofer. 5. Metabolic bone disease - Ca 10.1 C Ca 10.1 Continue binders when patient is not NPO. 6. Nutrition - NPO for Surgery. Renal diet when appropriate 7. DM Per primary 8. OSA: Per primary. CPAP from home  Plan - HD today and tomorrow am    Kelly Splinter MD  pager [phone]    cell [phone]  05/05/2015, 11:03 AM     Recent Labs Lab 05/04/15 0047 05/05/15 0411 05/05/15 0945  NA 137 141 140  K 4.9 4.7 4.9  CL 91* 111 96*  CO2 28 20* 28  GLUCOSE 164* 111* 108*  BUN 30* 12 39*  CREATININE 10.73* 0.85 11.41*  CALCIUM 10.1 9.2 8.5*  PHOS  --   --  8.0*    Recent Labs Lab 05/04/15 0047 05/05/15 0945  AST 21  --   ALT 6*  --   ALKPHOS 59  --   BILITOT 0.6  --   PROT 7.7  --   ALBUMIN 4.0 3.4*    Recent Labs Lab 05/04/15 0047 05/05/15 0945  WBC 13.0* 8.5  NEUTROABS 10.6*  --   HGB 11.7* 9.5*  HCT 36.2* 29.7*  MCV 100.3* 102.1*  PLT 296 209   . aspirin EC  81 mg Oral q morning - 10a  . calcitRIOL  0.5 mcg Oral  Once per day on Mon Wed Fri  . calcium acetate  667 mg Oral TID WC  . ferric gluconate (FERRLECIT/NULECIT) IV  125 mg Intravenous Q M,W,F-HD  . insulin aspart  0-15 Units Subcutaneous TID WC  . insulin glargine  15 Units Subcutaneous BH-q7a  . lanthanum  2,000 mg Oral TID WC  . levothyroxine  50 mcg Intravenous QAC breakfast  . loratadine  10 mg Oral Daily  . nebivolol  15 mg Oral Daily   . sodium chloride 125 mL/hr at 05/05/15 0546   sodium chloride, sodium chloride, acetaminophen **OR** acetaminophen, alteplase, alum & mag hydroxide-simeth, feeding supplement (NEPRO CARB STEADY), fluocinonide cream, heparin, lidocaine (PF), lidocaine-prilocaine, metoCLOPramide (REGLAN) injection, morphine injection, pentafluoroprop-tetrafluoroeth, promethazine, promethazine

## 2015-05-05 NOTE — Progress Notes (Signed)
1 Day Post-Op  Subjective: Nausea this morning and not passing flatus  Objective: Vital signs in last 24 hours: Temp:  [97.7 F (36.5 C)-99 F (37.2 C)] 97.7 F (36.5 C) (07/28 0542) Pulse Rate:  [84-94] 90 (07/28 0542) Resp:  [13-23] 17 (07/28 0542) BP: (98-124)/(51-67) 122/64 mmHg (07/28 0542) SpO2:  [81 %-99 %] 92 % (07/28 0542) Weight:  [135.626 kg (299 lb)] 135.626 kg (299 lb) (07/27 1133) Last BM Date: 05/03/15  Intake/Output from previous day: 07/27 0701 - 07/28 0700 In: 2787.2 [I.V.:2537.2; IV Piggyback:250] Out: 450 [Emesis/NG output:200] Intake/Output this shift:    Obese, mildly distended abdomen, minimally tender NG most of the way out so I reinserted it.  Lab Results:   Recent Labs  05/04/15 0047  WBC 13.0*  HGB 11.7*  HCT 36.2*  PLT 296   BMET  Recent Labs  05/04/15 0047 05/05/15 0411  NA 137 141  K 4.9 4.7  CL 91* 111  CO2 28 20*  GLUCOSE 164* 111*  BUN 30* 12  CREATININE 10.73* 0.85  CALCIUM 10.1 9.2   PT/INR No results for input(s): LABPROT, INR in the last 72 hours. ABG No results for input(s): PHART, HCO3 in the last 72 hours.  Invalid input(s): PCO2, PO2  Studies/Results: Ct Abdomen Pelvis Wo Contrast  05/04/2015   CLINICAL DATA:  Initial evaluation for acute diffuse abdominal pain with nausea and vomiting.  EXAM: CT ABDOMEN AND PELVIS WITHOUT CONTRAST  TECHNIQUE: Multidetector CT imaging of the abdomen and pelvis was performed following the standard protocol without IV contrast.  COMPARISON:  Prior radiograph from 02/23/2015  FINDINGS: Bibasilar atelectatic changes seen dependently within the visualized lung bases. Prominent extrapleural fat. Visualized lung bases are otherwise unremarkable.  Limited noncontrast evaluation of the liver is unremarkable. Gallbladder within normal limits. No biliary dilatation. Spleen, adrenal glands, and pancreas are within normal limits.  Right kidney is atrophic in appearance with scattered small  hypodensities, not well evaluated on this noncontrast examination. Patient is status post left oophorectomy. 2 cm nodular density within the left renal fossa is not significantly changed relative to previous.  Stomach is mildly distended with a error and enteric contrast material within the gastric lumen. Duodenum sweep within normal limits. There are multiple dilated loops of proximal small bowel within the mid and right abdomen with associated air-fluid levels. Loops measure up to 4.1 cm in diameter. There is a transition point within the mid abdomen. There is an adjacent ovoid well-circumscribed hypodense collection at this site that measures 3.2 x 6.5 x 5.6 cm (series 201, image 57). This is positioned just beneath the elbow like is, and may be reflect a seroma related to prior umbilical hernia repair. Distally, the small bowel is decompressed without acute inflammatory changes. Appendix is normal. Colon is decompressed. Few scattered noninflamed colonic diverticula noted within the descending colon.  Bladder within normal limits.  Prostate normal.  Small bilateral fat containing inguinal hernias noted.  No free intraperitoneal air. Small volume free fluid along the right pericolic gutter. No pathologically enlarged intra-abdominal pelvic lymph nodes. Moderate atheromatous plaque present within the intra-abdominal aorta. No aneurysm.  No acute osseous abnormality. No worrisome lytic or blastic osseous lesion.  Injection sites noted within the subcu fat of the anterior abdomen.  IMPRESSION: 1. Findings consistent with acute small bowel obstruction with transition point at the level of the umbilicus. There is a 3.2 x 6.5 x 5.6 cm hypodense collection at the transition point, which may reflect a postoperative seroma  related to prior umbilical hernia repair. 2. No other acute intra-abdominal or pelvic process. 3. Mild colonic diverticulosis without acute diverticulitis. 4. Status post left nephrectomy.    Electronically Signed   By: Jeannine Boga M.D.   On: 05/04/2015 06:45    Anti-infectives: Anti-infectives    Start     Dose/Rate Route Frequency Ordered Stop   05/04/15 1215  ceFAZolin (ANCEF) powder 2 g  Status:  Discontinued     2 g Other To [facility] Surgical 05/04/15 1033 05/04/15 1036   05/04/15 1215  [MAR Hold]  ceFAZolin (ANCEF) IVPB 2 g/50 mL premix     (MAR Hold since 05/04/15 1139)   2 g 100 mL/hr over 30 Minutes Intravenous To ShortStay Surgical 05/04/15 1036 05/04/15 1355   05/04/15 0915  ceFAZolin (ANCEF) IVPB 2 g/50 mL premix  Status:  Discontinued     2 g 100 mL/hr over 30 Minutes Intravenous 3 times per day 05/04/15 0900 05/04/15 1032      Assessment/Plan: s/p Procedure(s): LAPAROSCOPY DIAGNOSTIC LYSIS OF ADHESIONS (N/A)  SBO from omentum and small bowel minimally adhered to the umbilical mesh.  Came of easily in the OR.   Hopefully this will resolve quickly.  Will leave NG in for now.  LOS: 1 day    Lily Velasquez A 05/05/2015

## 2015-05-05 NOTE — Progress Notes (Signed)
Initial Nutrition Assessment  DOCUMENTATION CODES:   Morbid obesity  INTERVENTION:   -RD will follow for diet advancement and supplement diet as appropriate  NUTRITION DIAGNOSIS:   Inadequate oral intake related to altered GI function as evidenced by NPO status.  GOAL:   Patient will meet greater than or equal to 90% of their needs  MONITOR:   PO intake, Diet advancement, Labs, Weight trends, Skin, I & O's  REASON FOR ASSESSMENT:   Malnutrition Screening Tool    ASSESSMENT:   Robert [name] is a 54 y.o. male With a history of diabetes, hypothyroidism, end-stage renal disease, GERD, obstructive sleep apnea. Patient recently had surgery with Dr. Ninfa Linden for umbilical hernia repair with mesh and removal of peritoneal dialysis catheter on 04/28/2015. The surgery was uneventful and without complications. His recovery was normal until 2 days ago when he had gradual worsening of emesis a transition to black emesis. Episodes of emesis occurred 2-3 times a day. Additionally, he had minimal stool output, although has continued to have flatulence. He reports no palliating or provoking factors.  Pt admitted with SBO s/p umbilical hernia repair.   S/p Procedure(s) 05/04/15: LAPAROSCOPY DIAGNOSTIC LYSIS OF ADHESIONS  Pt off unit at time of visit. Spoke with RN, who confirms pt had surgery yesterday. RN also confirmed pt is NPO, with NGT for low-intermittent suction. Per doc flowsheets, 280 ml output within the past 24 hours.   Pt on HD. Per nephrology notes, plan for HD today and tomorrow. Wt hx reveals fluctuations in wt, likely due to HD.  Nephrology notes reveal bilateral edema. Wt ranges from 295-320# per documented wt hx.   Diet Order:  Diet NPO time specified Except for: Ice Chips  Skin:  Wound (see comment) (closed abdominal incision)  Last BM:  05/03/15  Height:   Ht Readings from Last 1 Encounters:  05/04/15 6' (1.829 m)    Weight:   Wt Readings from Last 1  Encounters:  05/05/15 302 lb 4 oz (137.1 kg)    Ideal Body Weight:  80.8 kg  BMI:  Body mass index is 40.98 kg/(m^2).  Estimated Nutritional Needs:   Kcal:  2100-2300  Protein:  100-115 grams  Fluid:  per MD  EDUCATION NEEDS:   No education needs identified at this time  Patton Swisher A. Jimmye Norman, RD, LDN, CDE Pager: [phone] After hours Pager: [phone]

## 2015-05-05 NOTE — Progress Notes (Signed)
[facility] PROGRESS NOTE  Robert [name] [id number] DOB: 03/17/1961 DOA: 05/04/2015 PCP: Haywood Pao, MD  Assessment/Plan: 1. SBP s/p Umbilical hernia repair -S/p Ex lap and LoA 7/27: SBO from omentum and small bowel minimally adhered to the umbilical mesh, Came of easily in the OR. -NG decompression, increased NG output, per CCS  2. ESRd on HS -per Renal  3. HTN -stable, soft, on bystolic  4. DM -CBGs 80-130, cut down lantus, SSI  5. OSA -CPAP QHS  6. Anemia of chronic disease and Iron defi -on Nulecit with HD  DVT proph: SCDs  Code Status: Full Code Family Communication: none at bedside Disposition Plan: home when improved   Consultants: Renal  CCS  Procedure : Ex lap, LoA: 7/27  HPI/Subjective: Complains of Abd pain, distension Objective: Filed Vitals:   05/05/15 1437  BP: 119/71  Pulse: 88  Temp: 98.1 F (36.7 C)  Resp:     Intake/Output Summary (Last 24 hours) at 05/05/15 1446 Last data filed at 05/05/15 1353  Gross per 24 hour  Intake 2787.17 ml  Output   1144 ml  Net 1643.17 ml   Filed Weights   05/04/15 0040 05/04/15 1133 05/05/15 0918  Weight: 136.533 kg (301 lb) 135.626 kg (299 lb) 137.1 kg (302 lb 4 oz)    Exam:   General:  AAOx3, no distress  Cardiovascular: s1s2/RRR  Respiratory:CTAB  Abdomen: soft, mildl distended, tender as expected  Musculoskeletal: no edema c/c   Data Reviewed: Basic Metabolic Panel:  Recent Labs Lab 05/02/15 0822 05/04/15 0047 05/05/15 0411 05/05/15 0945  NA  --  137 141 140  K  --  4.9 4.7 4.9  CL  --  91* 111 96*  CO2  --  28 20* 28  GLUCOSE 136* 164* 111* 108*  BUN  --  30* 12 39*  CREATININE  --  10.73* 0.85 11.41*  CALCIUM  --  10.1 9.2 8.5*  PHOS  --   --   --  8.0*   Liver Function Tests:  Recent Labs Lab 05/04/15 0047 05/05/15 0945  AST 21  --   ALT 6*  --   ALKPHOS 59  --   BILITOT 0.6  --   PROT 7.7  --   ALBUMIN 4.0 3.4*   No results for  input(s): LIPASE, AMYLASE in the last 168 hours. No results for input(s): AMMONIA in the last 168 hours. CBC:  Recent Labs Lab 05/04/15 0047 05/05/15 0945  WBC 13.0* 8.5  NEUTROABS 10.6*  --   HGB 11.7* 9.5*  HCT 36.2* 29.7*  MCV 100.3* 102.1*  PLT 296 209   Cardiac Enzymes: No results for input(s): CKTOTAL, CKMB, CKMBINDEX, TROPONINI in the last 168 hours. BNP (last 3 results) No results for input(s): BNP in the last 8760 hours.  ProBNP (last 3 results) No results for input(s): PROBNP in the last 8760 hours.  CBG:  Recent Labs Lab 05/04/15 1757 05/04/15 2053 05/05/15 0055 05/05/15 0546 05/05/15 1434  GLUCAP 117* 116* 107* 100* 89    Recent Results (from the past 240 hour(s))  Surgical pcr screen     Status: None   Collection Time: 05/04/15 11:25 AM  Result Value Ref Range Status   MRSA, PCR NEGATIVE NEGATIVE Final   Staphylococcus aureus NEGATIVE NEGATIVE Final    Comment:        The Xpert SA Assay (FDA approved for NASAL specimens in patients over 62 years of age), is one component of a comprehensive surveillance  program.  Test performance has been validated by [facility] for patients greater than or equal to 36 year old. It is not intended to diagnose infection nor to guide or monitor treatment.      Studies: Ct Abdomen Pelvis Wo Contrast  05/04/2015   CLINICAL DATA:  Initial evaluation for acute diffuse abdominal pain with nausea and vomiting.  EXAM: CT ABDOMEN AND PELVIS WITHOUT CONTRAST  TECHNIQUE: Multidetector CT imaging of the abdomen and pelvis was performed following the standard protocol without IV contrast.  COMPARISON:  Prior radiograph from 02/23/2015  FINDINGS: Bibasilar atelectatic changes seen dependently within the visualized lung bases. Prominent extrapleural fat. Visualized lung bases are otherwise unremarkable.  Limited noncontrast evaluation of the liver is unremarkable. Gallbladder within normal limits. No biliary dilatation. Spleen,  adrenal glands, and pancreas are within normal limits.  Right kidney is atrophic in appearance with scattered small hypodensities, not well evaluated on this noncontrast examination. Patient is status post left oophorectomy. 2 cm nodular density within the left renal fossa is not significantly changed relative to previous.  Stomach is mildly distended with a error and enteric contrast material within the gastric lumen. Duodenum sweep within normal limits. There are multiple dilated loops of proximal small bowel within the mid and right abdomen with associated air-fluid levels. Loops measure up to 4.1 cm in diameter. There is a transition point within the mid abdomen. There is an adjacent ovoid well-circumscribed hypodense collection at this site that measures 3.2 x 6.5 x 5.6 cm (series 201, image 57). This is positioned just beneath the elbow like is, and may be reflect a seroma related to prior umbilical hernia repair. Distally, the small bowel is decompressed without acute inflammatory changes. Appendix is normal. Colon is decompressed. Few scattered noninflamed colonic diverticula noted within the descending colon.  Bladder within normal limits.  Prostate normal.  Small bilateral fat containing inguinal hernias noted.  No free intraperitoneal air. Small volume free fluid along the right pericolic gutter. No pathologically enlarged intra-abdominal pelvic lymph nodes. Moderate atheromatous plaque present within the intra-abdominal aorta. No aneurysm.  No acute osseous abnormality. No worrisome lytic or blastic osseous lesion.  Injection sites noted within the subcu fat of the anterior abdomen.  IMPRESSION: 1. Findings consistent with acute small bowel obstruction with transition point at the level of the umbilicus. There is a 3.2 x 6.5 x 5.6 cm hypodense collection at the transition point, which may reflect a postoperative seroma related to prior umbilical hernia repair. 2. No other acute intra-abdominal or pelvic  process. 3. Mild colonic diverticulosis without acute diverticulitis. 4. Status post left nephrectomy.   Electronically Signed   By: Jeannine Boga M.D.   On: 05/04/2015 06:45    Scheduled Meds: . aspirin EC  81 mg Oral q morning - 10a  . calcitRIOL  0.5 mcg Oral Once per day on Mon Wed Fri  . calcium acetate  667 mg Oral TID WC  . ferric gluconate (FERRLECIT/NULECIT) IV  125 mg Intravenous Q M,W,F-HD  . insulin aspart  0-15 Units Subcutaneous TID WC  . insulin glargine  15 Units Subcutaneous BH-q7a  . lanthanum  2,000 mg Oral TID WC  . levothyroxine  50 mcg Intravenous QAC breakfast  . loratadine  10 mg Oral Daily  . nebivolol  15 mg Oral Daily   Continuous Infusions:  Antibiotics Given (last 72 hours)    Date/Time Action Medication Dose   05/04/15 1355 Given   [MAR Hold] ceFAZolin (ANCEF) IVPB 2 g/50  mL premix (MAR Hold since 05/04/15 1139) 2 g      Active Problems:   Diabetes mellitus type 2 in obese   Severe obstructive sleep apnea   Hypothyroidism   End stage renal disease on dialysis   SBO (small bowel obstruction)    Time spent:55min    [facility]  [facility] Pager [phone]. If 7PM-7AM, please contact night-coverage at [email or web address], password [facility] 05/05/2015, 2:46 PM  LOS: 1 day

## 2015-05-05 NOTE — Op Note (Signed)
NAME:  Robert, [name] NO.:  [id number]  MEDICAL RECORD NO.:  [id number]  LOCATION:  [id number]                        FACILITY:  [facility]  PHYSICIAN:  Coralie Keens, M.D. DATE OF BIRTH:  27-Mar-1961  DATE OF PROCEDURE:  05/04/2015 DATE OF DISCHARGE:                              OPERATIVE REPORT   PREOPERATIVE DIAGNOSIS:  Small-bowel obstruction, status post umbilical hernia repair.  POSTOPERATIVE DIAGNOSIS:  Small-bowel obstruction, status post umbilical hernia repair.  PROCEDURE:  Diagnostic laparoscopy with lysis of adhesions.  SURGEON:  Coralie Keens, M.D.  ANESTHESIA:  General with 0.25% Marcaine.  ESTIMATED BLOOD LOSS:  Minimal.  INDICATIONS:  Robert [name] is a 54 year old gentleman who had undergone an umbilical hernia repair with mesh approximately 6 days ago. He presents with nausea and vomiting.  A CAT scan showed a small-bowel obstruction.  There was question whether this could be at the umbilical hernia repair, there was a seroma there and a transition zone was around this area.  FINDINGS:  The patient was found to have omentum in small bowel adherent to the previously placed mesh.  The mesh was in excellent location.  The omentum and bowel easily separated from the overlying mesh.  There was no evidence of bowel stricture or bowel injury.  There was a small seroma present as well.  PROCEDURE IN DETAIL:  The patient was brought to the operating room, identified as Robert [name].  He was placed supine on the operating table and general anesthesia was induced.  His abdomen was then prepped and draped in usual sterile fashion.  I made a small incision in the patient's right upper quadrant.  Used a 5-mm trocar and Optiview camera to slowly traverse all levels of the abdominal wall and gained entrance into the peritoneal cavity.  Insufflation of the abdomen begun.  I evaluated the entrance site and saw no evidence of bowel injury.  I  then placed two 5-mm ports in the patient's right mid and lower abdomen.  The patient had omentum in small bowel adherent to the abdominal wall.  This easily peeled away with a Glassman clamp exposing the mesh and a small seroma.  The mesh was in perfect place with perfect repair of the hernia.  I evaluated the omentum and small bowel.  It was only slightly inflamed with no evidence of stricture or bowel injury.  This was the source that appeared of the partial obstruction by just being loosely adherent to the mesh.  At this point, again the mesh was visualized and found to be in perfect location.  At this point, all trocars were removed under direct vision, and the abdomen was deflated.  All incisions were anesthetized with Marcaine and closed with 4-0 Monocryl sutures and skin glue.  The patient tolerated the procedure well.  All the counts were correct at the end of procedure.  The patient was then extubated in operating room and taken in stable condition to the recovery room.     Coralie Keens, M.D.     DB/MEDQ  D:  05/04/2015  T:  05/05/2015  Job:  [id number]

## 2015-05-05 NOTE — Progress Notes (Signed)
Patient refused BIPAP for tonight. Patient notified to call RT if he changes his mind.

## 2015-05-06 LAB — GLUCOSE, CAPILLARY
GLUCOSE-CAPILLARY: 97 mg/dL (ref 65–99)
GLUCOSE-CAPILLARY: 127 mg/dL — AB (ref 65–99)
Glucose-Capillary: 104 mg/dL — ABNORMAL HIGH (ref 65–99)
Glucose-Capillary: 103 mg/dL — ABNORMAL HIGH (ref 65–99)
Glucose-Capillary: 141 mg/dL — ABNORMAL HIGH (ref 65–99)

## 2015-05-06 LAB — BASIC METABOLIC PANEL
ANION GAP: 18 — AB (ref 5–15)
BUN: 25 mg/dL — ABNORMAL HIGH (ref 6–20)
CHLORIDE: 95 mmol/L — AB (ref 101–111)
CO2: 25 mmol/L (ref 22–32)
Calcium: 8.7 mg/dL — ABNORMAL LOW (ref 8.9–10.3)
Creatinine, Ser: 9.33 mg/dL — ABNORMAL HIGH (ref 0.61–1.24)
GFR calc non Af Amer: 6 mL/min — ABNORMAL LOW (ref >60)
GFR, EST AFRICAN AMERICAN: 6 mL/min — AB (ref >60)
Glucose, Bld: 87 mg/dL (ref 65–99)
Potassium: 4.9 mmol/L (ref 3.5–5.1)
Sodium: 138 mmol/L (ref 135–145)

## 2015-05-06 LAB — CBC
HEMATOCRIT: 31.6 % — AB (ref 39.0–52.0)
Hemoglobin: 9.8 g/dL — ABNORMAL LOW (ref 13.0–17.0)
MCH: 31.9 pg (ref 26.0–34.0)
MCHC: 31 g/dL (ref 30.0–36.0)
MCV: 102.9 fL — AB (ref 78.0–100.0)
Platelets: 232 10*3/uL (ref 150–400)
RBC: 3.07 MIL/uL — AB (ref 4.22–5.81)
RDW: 15.7 % — ABNORMAL HIGH (ref 11.5–15.5)
WBC: 8.7 10*3/uL (ref 4.0–10.5)

## 2015-05-06 LAB — HEPATITIS B SURFACE ANTIGEN: Hepatitis B Surface Ag: NEGATIVE

## 2015-05-06 MED ORDER — HEPARIN SODIUM (PORCINE) 1000 UNIT/ML DIALYSIS
1000.0000 [IU] | INTRAMUSCULAR | Status: DC | PRN
  Filled 2015-05-06: qty 1

## 2015-05-06 MED ORDER — PENTAFLUOROPROP-TETRAFLUOROETH EX AERO: 1.0000 "application " | INHALATION_SPRAY | CUTANEOUS | Status: DC | PRN

## 2015-05-06 MED ORDER — CALCITRIOL 0.5 MCG PO CAPS
ORAL_CAPSULE | ORAL | Status: AC
  Filled 2015-05-06: qty 1

## 2015-05-06 MED ORDER — BISACODYL 10 MG RE SUPP
10.0000 mg | Freq: Once | RECTAL | Status: AC
  Administered 2015-05-06: 10 mg via RECTAL
  Filled 2015-05-06: qty 1

## 2015-05-06 MED ORDER — METOCLOPRAMIDE HCL 5 MG/ML IJ SOLN
10.0000 mg | Freq: Three times a day (TID) | INTRAMUSCULAR | Status: DC | PRN
Start: 2015-05-06 — End: 2015-05-08

## 2015-05-06 MED ORDER — LIDOCAINE HCL (PF) 1 % IJ SOLN: 5.0000 mL | INTRAMUSCULAR | Status: DC | PRN

## 2015-05-06 MED ORDER — LIDOCAINE-PRILOCAINE 2.5-2.5 % EX CREA: 1.0000 "application " | TOPICAL_CREAM | CUTANEOUS | Status: DC | PRN

## 2015-05-06 MED ORDER — SODIUM CHLORIDE 0.9 % IV SOLN: 100.0000 mL | INTRAVENOUS | Status: DC | PRN

## 2015-05-06 MED ORDER — INSULIN ASPART 100 UNIT/ML ~~LOC~~ SOLN: 0.0000 [IU] | Freq: Four times a day (QID) | SUBCUTANEOUS | Status: DC

## 2015-05-06 MED ORDER — ACETAMINOPHEN 325 MG PO TABS
ORAL_TABLET | ORAL | Status: AC
  Filled 2015-05-06: qty 2

## 2015-05-06 MED ORDER — INSULIN ASPART 100 UNIT/ML ~~LOC~~ SOLN
0.0000 [IU] | Freq: Three times a day (TID) | SUBCUTANEOUS | Status: DC
Start: 2015-05-06 — End: 2015-05-08
  Administered 2015-05-06 – 2015-05-07 (×3): 2 [IU] via SUBCUTANEOUS
  Administered 2015-05-07 – 2015-05-08 (×2): 3 [IU] via SUBCUTANEOUS

## 2015-05-06 MED ORDER — ALTEPLASE 2 MG IJ SOLR
2.0000 mg | Freq: Once | INTRAMUSCULAR | Status: DC | PRN
Start: 2015-05-06 — End: 2015-05-06
  Filled 2015-05-06: qty 2

## 2015-05-06 MED ORDER — NEPRO/CARBSTEADY PO LIQD
237.0000 mL | ORAL | Status: DC | PRN
  Filled 2015-05-06: qty 237

## 2015-05-06 NOTE — Progress Notes (Signed)
Pt is able to place on home machine when ready.

## 2015-05-06 NOTE — Progress Notes (Signed)
2 Days Post-Op  Subjective: He reports that he still feels distended Passing flatus Denies nausea or pain Large amount of NG output but taking a lot of ice and water  Objective: Vital signs in last 24 hours: Temp:  [97.5 F (36.4 C)-98.4 F (36.9 C)] 97.6 F (36.4 C) (07/29 [id number]) Pulse Rate:  [82-93] 90 (07/29 0611) Resp:  [16-20] 18 (07/29 0611) BP: (94-140)/(44-72) 140/70 mmHg (07/29 0611) SpO2:  [93 %-95 %] 95 % (07/29 0611) Weight:  [137.1 kg (302 lb 4 oz)] 137.1 kg (302 lb 4 oz) (07/28 0918) Last BM Date: 05/03/15  Intake/Output from previous day: 07/28 0701 - 07/29 0700 In: 120 [P.O.:120] Out: 3294 [Emesis/NG output:3400] Intake/Output this shift:    Abdomen soft, non tender, +bs  Lab Results:   Recent Labs  05/05/15 0945 05/06/15 0344  WBC 8.5 8.7  HGB 9.5* 9.8*  HCT 29.7* 31.6*  PLT 209 232   BMET  Recent Labs  05/05/15 0945 05/06/15 0344  NA 140 138  K 4.9 4.9  CL 96* 95*  CO2 28 25  GLUCOSE 108* 87  BUN 39* 25*  CREATININE 11.41* 9.33*  CALCIUM 8.5* 8.7*   PT/INR No results for input(s): LABPROT, INR in the last 72 hours. ABG No results for input(s): PHART, HCO3 in the last 72 hours.  Invalid input(s): PCO2, PO2  Studies/Results: No results found.  Anti-infectives: Anti-infectives    Start     Dose/Rate Route Frequency Ordered Stop   05/04/15 1215  ceFAZolin (ANCEF) powder 2 g  Status:  Discontinued     2 g Other To [facility] Surgical 05/04/15 1033 05/04/15 1036   05/04/15 1215  [MAR Hold]  ceFAZolin (ANCEF) IVPB 2 g/50 mL premix     (MAR Hold since 05/04/15 1139)   2 g 100 mL/hr over 30 Minutes Intravenous To ShortStay Surgical 05/04/15 1036 05/04/15 1355   05/04/15 0915  ceFAZolin (ANCEF) IVPB 2 g/50 mL premix  Status:  Discontinued     2 g 100 mL/hr over 30 Minutes Intravenous 3 times per day 05/04/15 0900 05/04/15 1032      Assessment/Plan: s/p Procedure(s): LAPAROSCOPY DIAGNOSTIC LYSIS OF ADHESIONS (N/A)  Resolving  ileus/partial SBO  Will clamp NG and see how he handles it.  Will allow liquids while NG clamped.  If no nausea after 8 hours, will D/C NG. Try dulcolax suppos once back from dialysis.  LOS: 2 days    Bekka Qian A 05/06/2015

## 2015-05-06 NOTE — Progress Notes (Signed)
[facility] PROGRESS NOTE  JAIRUS MARTUS [id number] DOB: 13-Oct-1960 DOA: 05/04/2015 PCP: Haywood Pao, MD  Assessment/Plan: 1. SBP s/p Umbilical hernia repair -S/p Ex lap and LoA 7/27: SBO from omentum and small bowel minimally adhered to the umbilical mesh, Came of easily in the OR. -now with ileus, s/p NG decompression,  -NG clamped and PO trial-per CCS  2. ESRd on HS -per Renal  3. HTN -stable, soft, on bystolic  4. DM -CBGs 80-130, cut down lantus, SSI  5. OSA -CPAP QHS  6. Anemia of chronic disease and Iron defi -on Nulecit with HD  DVT proph: SCDs  Code Status: Full Code Family Communication: none at bedside Disposition Plan: home when improved   Consultants: Renal  CCS  Procedure : Ex lap, LoA: 7/27  HPI/Subjective: Feels a little better, still bloated  Objective: Filed Vitals:   05/06/15 1339  BP: 159/75  Pulse: 92  Temp: 98.5 F (36.9 C)  Resp: 20    Intake/Output Summary (Last 24 hours) at 05/06/15 1415 Last data filed at 05/06/15 1237  Gross per 24 hour  Intake    120 ml  Output   2400 ml  Net  -2280 ml   Filed Weights   05/06/15 0857 05/06/15 1237 05/06/15 1339  Weight: 134.5 kg (296 lb 8.3 oz) 134.6 kg (296 lb 11.8 oz) 137 kg (302 lb 0.5 oz)    Exam:   General:  AAOx3, no distress  Cardiovascular: s1s2/RRR  Respiratory:CTAB  Abdomen: soft, mildl distended, non tender  Musculoskeletal: no edema c/c   Data Reviewed: Basic Metabolic Panel:  Recent Labs Lab 05/02/15 0822 05/04/15 0047 05/05/15 0411 05/05/15 0945 05/06/15 0344  NA  --  137 141 140 138  K  --  4.9 4.7 4.9 4.9  CL  --  91* 111 96* 95*  CO2  --  28 20* 28 25  GLUCOSE 136* 164* 111* 108* 87  BUN  --  30* 12 39* 25*  CREATININE  --  10.73* 0.85 11.41* 9.33*  CALCIUM  --  10.1 9.2 8.5* 8.7*  PHOS  --   --   --  8.0*  --    Liver Function Tests:  Recent Labs Lab 05/04/15 0047 05/05/15 0945  AST 21  --   ALT 6*  --   ALKPHOS 59   --   BILITOT 0.6  --   PROT 7.7  --   ALBUMIN 4.0 3.4*   No results for input(s): LIPASE, AMYLASE in the last 168 hours. No results for input(s): AMMONIA in the last 168 hours. CBC:  Recent Labs Lab 05/04/15 0047 05/05/15 0945 05/06/15 0344  WBC 13.0* 8.5 8.7  NEUTROABS 10.6*  --   --   HGB 11.7* 9.5* 9.8*  HCT 36.2* 29.7* 31.6*  MCV 100.3* 102.1* 102.9*  PLT 296 209 232   Cardiac Enzymes: No results for input(s): CKTOTAL, CKMB, CKMBINDEX, TROPONINI in the last 168 hours. BNP (last 3 results) No results for input(s): BNP in the last 8760 hours.  ProBNP (last 3 results) No results for input(s): PROBNP in the last 8760 hours.  CBG:  Recent Labs Lab 05/05/15 1732 05/05/15 2353 05/06/15 0605 05/06/15 0813 05/06/15 1236  GLUCAP 93 90 97 104* 103*    Recent Results (from the past 240 hour(s))  Surgical pcr screen     Status: None   Collection Time: 05/04/15 11:25 AM  Result Value Ref Range Status   MRSA, PCR NEGATIVE NEGATIVE Final   Staphylococcus  aureus NEGATIVE NEGATIVE Final    Comment:        The Xpert SA Assay (FDA approved for NASAL specimens in patients over 66 years of age), is one component of a comprehensive surveillance program.  Test performance has been validated by [facility] for patients greater than or equal to 52 year old. It is not intended to diagnose infection nor to guide or monitor treatment.      Studies: No results found.  Scheduled Meds: . aspirin EC  81 mg Oral q morning - 10a  . calcitRIOL  0.5 mcg Oral Once per day on Mon Wed Fri  . calcium acetate  667 mg Oral TID WC  . ferric gluconate (FERRLECIT/NULECIT) IV  125 mg Intravenous Q M,W,F-HD  . insulin aspart  0-15 Units Subcutaneous 4 times per day  . insulin glargine  10 Units Subcutaneous BH-q7a  . lanthanum  2,000 mg Oral TID WC  . levothyroxine  50 mcg Intravenous QAC breakfast  . loratadine  10 mg Oral Daily  . nebivolol  15 mg Oral Daily   Continuous  Infusions:  Antibiotics Given (last 72 hours)    Date/Time Action Medication Dose   05/04/15 1355 Given   [MAR Hold] ceFAZolin (ANCEF) IVPB 2 g/50 mL premix (MAR Hold since 05/04/15 1139) 2 g      Active Problems:   Diabetes mellitus type 2 in obese   Severe obstructive sleep apnea   Hypothyroidism   End stage renal disease on dialysis   SBO (small bowel obstruction)    Time spent:56min    [facility]  [facility] Pager [phone]. If 7PM-7AM, please contact night-coverage at [email or web address], password [facility] 05/06/2015, 2:15 PM  LOS: 2 days

## 2015-05-06 NOTE — Progress Notes (Signed)
  [facility] Progress Note   Subjective: NG clamped, taking pills, stood to weigh, no new complaints  Filed Vitals:   05/06/15 0915 05/06/15 0930 05/06/15 1000 05/06/15 1030  BP: 158/92 162/84 167/97 170/85  Pulse: 89 85 88 89  Temp:      TempSrc:      Resp:      Height:      Weight:      SpO2:       Exam: Alert, no distress, NG in place No jvd Chest clear bilat RRR no MRG ABd obese, soft +BS , midline wounds are dressed Trace LE edema bilat R forearm AVF +bruit , R IJ TDC intact site Neuro is alert, Ox 3  [facility] MWF  4.5h   2/2 bath  140.5kg   Heparin 14000   R IJ cath (maturing RFA AVF) Venofer 100 x 7 more Aranesp 200 / wk Calcitriol 1.5 ug tiw      Assessment: 1. SBO: per primary and surgery, recovering well, NG clamped 2. ESRD - HD today 3. HTN - BP slightly high, on bystolic 4. Vol is under dry wt, stable 5. Anemia - Hb stable 6. MBD stable 7. DM Per primary  Plan - HD today, no fluid off    Kelly Splinter MD  pager [phone]    cell [phone]  05/06/2015, 10:38 AM     Recent Labs Lab 05/05/15 0411 05/05/15 0945 05/06/15 0344  NA 141 140 138  K 4.7 4.9 4.9  CL 111 96* 95*  CO2 20* 28 25  GLUCOSE 111* 108* 87  BUN 12 39* 25*  CREATININE 0.85 11.41* 9.33*  CALCIUM 9.2 8.5* 8.7*  PHOS  --  8.0*  --     Recent Labs Lab 05/04/15 0047 05/05/15 0945  AST 21  --   ALT 6*  --   ALKPHOS 59  --   BILITOT 0.6  --   PROT 7.7  --   ALBUMIN 4.0 3.4*    Recent Labs Lab 05/04/15 0047 05/05/15 0945 05/06/15 0344  WBC 13.0* 8.5 8.7  NEUTROABS 10.6*  --   --   HGB 11.7* 9.5* 9.8*  HCT 36.2* 29.7* 31.6*  MCV 100.3* 102.1* 102.9*  PLT 296 209 232   . aspirin EC  81 mg Oral q morning - 10a  . bisacodyl  10 mg Rectal Once  . calcitRIOL  0.5 mcg Oral Once per day on Mon Wed Fri  . calcium acetate  667 mg Oral TID WC  . ferric gluconate (FERRLECIT/NULECIT) IV  125 mg Intravenous Q M,W,F-HD  . insulin aspart  0-15 Units  Subcutaneous TID WC  . insulin glargine  10 Units Subcutaneous BH-q7a  . lanthanum  2,000 mg Oral TID WC  . levothyroxine  50 mcg Intravenous QAC breakfast  . loratadine  10 mg Oral Daily  . nebivolol  15 mg Oral Daily     sodium chloride, sodium chloride, acetaminophen **OR** acetaminophen, alteplase, alum & mag hydroxide-simeth, feeding supplement (NEPRO CARB STEADY), fluocinonide cream, heparin, lidocaine (PF), lidocaine-prilocaine, metoCLOPramide (REGLAN) injection, morphine injection, pentafluoroprop-tetrafluoroeth, promethazine, promethazine

## 2015-05-07 LAB — CBC
HCT: 30.4 % — ABNORMAL LOW (ref 39.0–52.0)
Hemoglobin: 9.7 g/dL — ABNORMAL LOW (ref 13.0–17.0)
MCH: 31.5 pg (ref 26.0–34.0)
MCHC: 31.9 g/dL (ref 30.0–36.0)
MCV: 98.7 fL (ref 78.0–100.0)
Platelets: 251 10*3/uL (ref 150–400)
RBC: 3.08 MIL/uL — ABNORMAL LOW (ref 4.22–5.81)
RDW: 15.3 % (ref 11.5–15.5)
WBC: 8.1 10*3/uL (ref 4.0–10.5)

## 2015-05-07 LAB — BASIC METABOLIC PANEL
ANION GAP: 13 (ref 5–15)
BUN: 25 mg/dL — AB (ref 6–20)
CHLORIDE: 96 mmol/L — AB (ref 101–111)
CO2: 28 mmol/L (ref 22–32)
CREATININE: 8.32 mg/dL — AB (ref 0.61–1.24)
Calcium: 9.5 mg/dL (ref 8.9–10.3)
GFR calc non Af Amer: 6 mL/min — ABNORMAL LOW (ref >60)
GFR, EST AFRICAN AMERICAN: 7 mL/min — AB (ref >60)
Glucose, Bld: 114 mg/dL — ABNORMAL HIGH (ref 65–99)
Potassium: 4.1 mmol/L (ref 3.5–5.1)
Sodium: 137 mmol/L (ref 135–145)

## 2015-05-07 LAB — GLUCOSE, CAPILLARY
GLUCOSE-CAPILLARY: 159 mg/dL — AB (ref 65–99)
Glucose-Capillary: 111 mg/dL — ABNORMAL HIGH (ref 65–99)
Glucose-Capillary: 142 mg/dL — ABNORMAL HIGH (ref 65–99)
Glucose-Capillary: 113 mg/dL — ABNORMAL HIGH (ref 65–99)

## 2015-05-07 MED ORDER — LEVOTHYROXINE SODIUM 100 MCG PO TABS
100.0000 ug | ORAL_TABLET | Freq: Every day | ORAL | Status: DC
  Administered 2015-05-07 – 2015-05-08 (×2): 100 ug via ORAL
  Filled 2015-05-07 (×2): qty 1

## 2015-05-07 MED ORDER — POLYETHYLENE GLYCOL 3350 17 G PO PACK
17.0000 g | PACK | Freq: Once | ORAL | Status: AC
  Administered 2015-05-07: 17 g via ORAL
  Filled 2015-05-07: qty 1

## 2015-05-07 NOTE — Progress Notes (Signed)
Patient has home CPAP machine set up at bedside.  Patient is able to place himself on /off as needed. Equipment placed on bedside table within reach for patient.

## 2015-05-07 NOTE — Progress Notes (Signed)
  [facility] Progress Note   Subjective: NG out, +BM and flatus.  No new issues.   Filed Vitals:   05/06/15 1237 05/06/15 1339 05/06/15 2149 05/07/15 0456  BP: 150/79 159/75 137/64 150/68  Pulse: 88 92 83 85  Temp: 98 F (36.7 C) 98.5 F (36.9 C) 99 F (37.2 C) 98.1 F (36.7 C)  TempSrc: Oral Oral Oral Oral  Resp: 20 20 20 18   Height:      Weight: 134.6 kg (296 lb 11.8 oz) 137 kg (302 lb 0.5 oz)    SpO2: 95% 97% 93% 94%   Exam: Alert, no distress, NG out No jvd Chest clear bilat RRR no MRG ABd obese, soft +BS , midline wounds intact Trace LE edema bilat R forearm AVF +bruit , R IJ TDC intact site Neuro is alert, Ox 3  [facility] MWF  4.5h   2/2 bath  140.5kg   Heparin 14000   R IJ cath (maturing RFA AVF) Venofer 100 x 7 more Aranesp 200 / wk Calcitriol 1.5 ug tiw      Assessment: 1. SBO: per primary and surgery, recovering well, NG out and eating, +BM 2. ESRD - next HD Monday 3. HTN - BP stable on bystolic 4. Vol is under dry wt, stable. Lost some weight. Lower dry wt at dc.  5. Anemia - Hb 9.7 6. MBD stable 7. DM Per primary  Plan - next HD Monday. If dc'd over weekend he will dialyze at the outpatient unit on Monday.    Kelly Splinter MD  pager [phone]    cell [phone]  05/07/2015, 10:11 AM     Recent Labs Lab 05/05/15 0945 05/06/15 0344 05/07/15 0322  NA 140 138 137  K 4.9 4.9 4.1  CL 96* 95* 96*  CO2 28 25 28   GLUCOSE 108* 87 114*  BUN 39* 25* 25*  CREATININE 11.41* 9.33* 8.32*  CALCIUM 8.5* 8.7* 9.5  PHOS 8.0*  --   --     Recent Labs Lab 05/04/15 0047 05/05/15 0945  AST 21  --   ALT 6*  --   ALKPHOS 59  --   BILITOT 0.6  --   PROT 7.7  --   ALBUMIN 4.0 3.4*    Recent Labs Lab 05/04/15 0047 05/05/15 0945 05/06/15 0344 05/07/15 0322  WBC 13.0* 8.5 8.7 8.1  NEUTROABS 10.6*  --   --   --   HGB 11.7* 9.5* 9.8* 9.7*  HCT 36.2* 29.7* 31.6* 30.4*  MCV 100.3* 102.1* 102.9* 98.7  PLT 296 209 232 251   . aspirin EC   81 mg Oral q morning - 10a  . calcitRIOL  0.5 mcg Oral Once per day on Mon Wed Fri  . calcium acetate  667 mg Oral TID WC  . ferric gluconate (FERRLECIT/NULECIT) IV  125 mg Intravenous Q M,W,F-HD  . insulin aspart  0-15 Units Subcutaneous TID AC & HS  . insulin glargine  10 Units Subcutaneous BH-q7a  . lanthanum  2,000 mg Oral TID WC  . levothyroxine  100 mcg Oral QAC breakfast  . loratadine  10 mg Oral Daily  . nebivolol  15 mg Oral Daily     acetaminophen **OR** acetaminophen, alum & mag hydroxide-simeth, fluocinonide cream, metoCLOPramide (REGLAN) injection, morphine injection, promethazine, promethazine

## 2015-05-07 NOTE — Progress Notes (Signed)
3 Days Post-Op  Subjective: Improving.  Ambulating in room but not in hall NG out.  Tolerating clear liquids.  Had one bowel movement.  Feels better.  Minimal abdominal pain.  Asking if he needs dialysis tomorrow.  Normally gets dialysis Monday Wednesday and Friday.    Objective: Vital signs in last 24 hours: Temp:  [98 F (36.7 C)-99 F (37.2 C)] 98.1 F (36.7 C) (07/30 0456) Pulse Rate:  [47-94] 85 (07/30 0456) Resp:  [18-20] 18 (07/30 0456) BP: (121-172)/(64-98) 150/68 mmHg (07/30 0456) SpO2:  [91 %-97 %] 94 % (07/30 0456) Weight:  [134.5 kg (296 lb 8.3 oz)-137 kg (302 lb 0.5 oz)] 137 kg (302 lb 0.5 oz) (07/29 1339) Last BM Date: 05/03/15  Intake/Output from previous day: 07/29 0701 - 07/30 0700 In: 600 [P.O.:600] Out: -200  Intake/Output this shift:    General appearance: Alert.  Pleasant and cooperative.  No distress.  Does not look uncomfortable. GI: Somewhat obese.  Soft and minimally tender.  Bowel sounds present.  All wounds look fine.  Lab Results:  Results for orders placed or performed during the hospital encounter of 05/04/15 (from the past 24 hour(s))  Hepatitis B surface antigen     Status: None   Collection Time: 05/06/15  9:15 AM  Result Value Ref Range   Hepatitis B Surface Ag Negative Negative  Glucose, capillary     Status: Abnormal   Collection Time: 05/06/15 12:36 PM  Result Value Ref Range   Glucose-Capillary 103 (H) 65 - 99 mg/dL  Glucose, capillary     Status: Abnormal   Collection Time: 05/06/15  5:21 PM  Result Value Ref Range   Glucose-Capillary 127 (H) 65 - 99 mg/dL   Comment 1 Notify RN   Glucose, capillary     Status: Abnormal   Collection Time: 05/06/15  9:49 PM  Result Value Ref Range   Glucose-Capillary 141 (H) 65 - 99 mg/dL   Comment 1 Notify RN   Basic metabolic panel     Status: Abnormal   Collection Time: 05/07/15  3:22 AM  Result Value Ref Range   Sodium 137 135 - 145 mmol/L   Potassium 4.1 3.5 - 5.1 mmol/L   Chloride 96  (L) 101 - 111 mmol/L   CO2 28 22 - 32 mmol/L   Glucose, Bld 114 (H) 65 - 99 mg/dL   BUN 25 (H) 6 - 20 mg/dL   Creatinine, Ser 8.32 (H) 0.61 - 1.24 mg/dL   Calcium 9.5 8.9 - 10.3 mg/dL   GFR calc non Af Amer 6 (L) >60 mL/min   GFR calc Af Amer 7 (L) >60 mL/min   Anion gap 13 5 - 15  CBC     Status: Abnormal   Collection Time: 05/07/15  3:22 AM  Result Value Ref Range   WBC 8.1 4.0 - 10.5 K/uL   RBC 3.08 (L) 4.22 - 5.81 MIL/uL   Hemoglobin 9.7 (L) 13.0 - 17.0 g/dL   HCT 30.4 (L) 39.0 - 52.0 %   MCV 98.7 78.0 - 100.0 fL   MCH 31.5 26.0 - 34.0 pg   MCHC 31.9 30.0 - 36.0 g/dL   RDW 15.3 11.5 - 15.5 %   Platelets 251 150 - 400 K/uL  Glucose, capillary     Status: Abnormal   Collection Time: 05/07/15  7:46 AM  Result Value Ref Range   Glucose-Capillary 159 (H) 65 - 99 mg/dL     Studies/Results: No results found.  Marland Kitchen aspirin EC  81 mg Oral q morning - 10a  . calcitRIOL  0.5 mcg Oral Once per day on Mon Wed Fri  . calcium acetate  667 mg Oral TID WC  . ferric gluconate (FERRLECIT/NULECIT) IV  125 mg Intravenous Q M,W,F-HD  . insulin aspart  0-15 Units Subcutaneous TID AC & HS  . insulin glargine  10 Units Subcutaneous BH-q7a  . lanthanum  2,000 mg Oral TID WC  . levothyroxine  50 mcg Intravenous QAC breakfast  . loratadine  10 mg Oral Daily  . nebivolol  15 mg Oral Daily     Assessment/Plan: s/p Procedure(s): LAPAROSCOPY DIAGNOSTIC LYSIS OF ADHESIONS  POD #3.  Diagnostic laparoscopy, lysis of adhesions for SBO.   Ileus resolving.  Advance to renal diet, carb modified  Might be ready to go home from surgical standpoint tomorrow   ESRD.  Hemodialysis schedule per nephrology  Hypertension.   DM.  Management per medicine  OSA.  -  CPAP Q HS   @PROBHOSP @  LOS: 3 days    Dayane Hillenburg M 05/07/2015  . .prob

## 2015-05-07 NOTE — Progress Notes (Signed)
[facility] PROGRESS NOTE  DESIRE PEDDER [id number] DOB: 1961/01/09 DOA: 05/04/2015 PCP: Haywood Pao, MD  Assessment/Plan: 1. SBP s/p Umbilical hernia repair -S/p Ex lap and LoA 7/27: SBO from omentum and small bowel minimally adhered to the umbilical mesh, Came of easily in the OR. -now with ileus, s/p NG decompression,  -NG clamped, tolerating clears -diet advanced  2. ESRd on HS -per Renal  3. HTN -stable, soft, on bystolic  4. DM -CBGs 80-130, cut down lantus, SSI  5. OSA -CPAP QHS  6. Anemia of chronic disease and Iron defi -on Nulecit with HD  DVT proph: SCDs  Code Status: Full Code Family Communication: none at bedside Disposition Plan: home tomorrow if stable   Consultants: Renal  CCS  Procedure : Ex lap, LoA: 7/27  HPI/Subjective: Feels a little better, still bloated  Objective: Filed Vitals:   05/07/15 0456  BP: 150/68  Pulse: 85  Temp: 98.1 F (36.7 C)  Resp: 18    Intake/Output Summary (Last 24 hours) at 05/07/15 1301 Last data filed at 05/06/15 2231  Gross per 24 hour  Intake    360 ml  Output      0 ml  Net    360 ml   Filed Weights   05/06/15 0857 05/06/15 1237 05/06/15 1339  Weight: 134.5 kg (296 lb 8.3 oz) 134.6 kg (296 lb 11.8 oz) 137 kg (302 lb 0.5 oz)    Exam:   General:  AAOx3, no distress  Cardiovascular: s1s2/RRR  Respiratory:CTAB  Abdomen: soft, mild distended, non tender  Musculoskeletal: no edema c/c   Data Reviewed: Basic Metabolic Panel:  Recent Labs Lab 05/04/15 0047 05/05/15 0411 05/05/15 0945 05/06/15 0344 05/07/15 0322  NA 137 141 140 138 137  K 4.9 4.7 4.9 4.9 4.1  CL 91* 111 96* 95* 96*  CO2 28 20* 28 25 28   GLUCOSE 164* 111* 108* 87 114*  BUN 30* 12 39* 25* 25*  CREATININE 10.73* 0.85 11.41* 9.33* 8.32*  CALCIUM 10.1 9.2 8.5* 8.7* 9.5  PHOS  --   --  8.0*  --   --    Liver Function Tests:  Recent Labs Lab 05/04/15 0047 05/05/15 0945  AST 21  --   ALT 6*  --    ALKPHOS 59  --   BILITOT 0.6  --   PROT 7.7  --   ALBUMIN 4.0 3.4*   No results for input(s): LIPASE, AMYLASE in the last 168 hours. No results for input(s): AMMONIA in the last 168 hours. CBC:  Recent Labs Lab 05/04/15 0047 05/05/15 0945 05/06/15 0344 05/07/15 0322  WBC 13.0* 8.5 8.7 8.1  NEUTROABS 10.6*  --   --   --   HGB 11.7* 9.5* 9.8* 9.7*  HCT 36.2* 29.7* 31.6* 30.4*  MCV 100.3* 102.1* 102.9* 98.7  PLT 296 209 232 251   Cardiac Enzymes: No results for input(s): CKTOTAL, CKMB, CKMBINDEX, TROPONINI in the last 168 hours. BNP (last 3 results) No results for input(s): BNP in the last 8760 hours.  ProBNP (last 3 results) No results for input(s): PROBNP in the last 8760 hours.  CBG:  Recent Labs Lab 05/06/15 1236 05/06/15 1721 05/06/15 2149 05/07/15 0746 05/07/15 1225  GLUCAP 103* 127* 141* 159* 111*    Recent Results (from the past 240 hour(s))  Surgical pcr screen     Status: None   Collection Time: 05/04/15 11:25 AM  Result Value Ref Range Status   MRSA, PCR NEGATIVE NEGATIVE Final  Staphylococcus aureus NEGATIVE NEGATIVE Final    Comment:        The Xpert SA Assay (FDA approved for NASAL specimens in patients over 72 years of age), is one component of a comprehensive surveillance program.  Test performance has been validated by [facility] for patients greater than or equal to 84 year old. It is not intended to diagnose infection nor to guide or monitor treatment.      Studies: No results found.  Scheduled Meds: . aspirin EC  81 mg Oral q morning - 10a  . calcitRIOL  0.5 mcg Oral Once per day on Mon Wed Fri  . calcium acetate  667 mg Oral TID WC  . ferric gluconate (FERRLECIT/NULECIT) IV  125 mg Intravenous Q M,W,F-HD  . insulin aspart  0-15 Units Subcutaneous TID AC & HS  . insulin glargine  10 Units Subcutaneous BH-q7a  . lanthanum  2,000 mg Oral TID WC  . levothyroxine  100 mcg Oral QAC breakfast  . loratadine  10 mg Oral Daily  .  nebivolol  15 mg Oral Daily   Continuous Infusions:  Antibiotics Given (last 72 hours)    Date/Time Action Medication Dose   05/04/15 1355 Given   [MAR Hold] ceFAZolin (ANCEF) IVPB 2 g/50 mL premix (MAR Hold since 05/04/15 1139) 2 g      Active Problems:   Diabetes mellitus type 2 in obese   Severe obstructive sleep apnea   Hypothyroidism   End stage renal disease on dialysis   SBO (small bowel obstruction)    Time spent: 82min    Brailen Macneal  [facility] Pager [phone]. If 7PM-7AM, please contact night-coverage at [email or web address], password [facility] 05/07/2015, 1:01 PM  LOS: 3 days

## 2015-05-08 DIAGNOSIS — N189 Chronic kidney disease, unspecified: Secondary | ICD-10-CM | POA: Insufficient documentation

## 2015-05-08 LAB — BASIC METABOLIC PANEL
Anion gap: 17 — ABNORMAL HIGH (ref 5–15)
BUN: 44 mg/dL — AB (ref 6–20)
CO2: 25 mmol/L (ref 22–32)
Calcium: 9.6 mg/dL (ref 8.9–10.3)
Chloride: 94 mmol/L — ABNORMAL LOW (ref 101–111)
Creatinine, Ser: 11.12 mg/dL — ABNORMAL HIGH (ref 0.61–1.24)
GFR calc Af Amer: 5 mL/min — ABNORMAL LOW (ref >60)
GFR, EST NON AFRICAN AMERICAN: 5 mL/min — AB (ref >60)
Glucose, Bld: 108 mg/dL — ABNORMAL HIGH (ref 65–99)
Potassium: 4 mmol/L (ref 3.5–5.1)
Sodium: 136 mmol/L (ref 135–145)

## 2015-05-08 LAB — GLUCOSE, CAPILLARY
GLUCOSE-CAPILLARY: 112 mg/dL — AB (ref 65–99)
GLUCOSE-CAPILLARY: 166 mg/dL — AB (ref 65–99)

## 2015-05-08 MED ORDER — INSULIN REGULAR HUMAN (CONC) 500 UNIT/ML ~~LOC~~ SOLN: SUBCUTANEOUS | Status: DC

## 2015-05-08 MED ORDER — INSULIN GLARGINE 100 UNIT/ML SOLOSTAR PEN: 15.0000 [IU] | PEN_INJECTOR | SUBCUTANEOUS | Status: DC

## 2015-05-08 NOTE — Progress Notes (Signed)
4 Days Post-Op  Subjective: Feeling good this morning.  Has continued to have bowel movements.  Vomited small amount after supper last night.  Denies abdominal pain or nausea this morning.  Wants to know whether to eat or not. Potassium 4.0.  Creatinine 11.12.  BUN 44.  Glucose 108.  Objective: Vital signs in last 24 hours: Temp:  [97.7 F (36.5 C)-99.1 F (37.3 C)] 97.7 F (36.5 C) (07/31 0518) Pulse Rate:  [75-85] 75 (07/31 0518) Resp:  [20-24] 24 (07/31 0518) BP: (129-149)/(66-76) 129/66 mmHg (07/31 0518) SpO2:  [94 %-97 %] 97 % (07/31 0518) Last BM Date: 05/07/15  Intake/Output from previous day:   Intake/Output this shift:    General appearance: Alert.  Cooperative.  In no distress.  Mental status normal. GI: Soft.  Nontender.  Somewhat obese.  Wounds look good.  Normal active bowel sounds.  Lab Results:  Results for orders placed or performed during the hospital encounter of 05/04/15 (from the past 24 hour(s))  Glucose, capillary     Status: Abnormal   Collection Time: 05/07/15 12:25 PM  Result Value Ref Range   Glucose-Capillary 111 (H) 65 - 99 mg/dL  Glucose, capillary     Status: Abnormal   Collection Time: 05/07/15  4:41 PM  Result Value Ref Range   Glucose-Capillary 142 (H) 65 - 99 mg/dL  Glucose, capillary     Status: Abnormal   Collection Time: 05/07/15  9:28 PM  Result Value Ref Range   Glucose-Capillary 113 (H) 65 - 99 mg/dL   Comment 1 Notify RN   Basic metabolic panel     Status: Abnormal   Collection Time: 05/08/15  4:18 AM  Result Value Ref Range   Sodium 136 135 - 145 mmol/L   Potassium 4.0 3.5 - 5.1 mmol/L   Chloride 94 (L) 101 - 111 mmol/L   CO2 25 22 - 32 mmol/L   Glucose, Bld 108 (H) 65 - 99 mg/dL   BUN 44 (H) 6 - 20 mg/dL   Creatinine, Ser 11.12 (H) 0.61 - 1.24 mg/dL   Calcium 9.6 8.9 - 10.3 mg/dL   GFR calc non Af Amer 5 (L) >60 mL/min   GFR calc Af Amer 5 (L) >60 mL/min   Anion gap 17 (H) 5 - 15  Glucose, capillary     Status: Abnormal    Collection Time: 05/08/15  7:51 AM  Result Value Ref Range   Glucose-Capillary 112 (H) 65 - 99 mg/dL     Studies/Results: No results found.  Marland Kitchen aspirin EC  81 mg Oral q morning - 10a  . calcitRIOL  0.5 mcg Oral Once per day on Mon Wed Fri  . calcium acetate  667 mg Oral TID WC  . ferric gluconate (FERRLECIT/NULECIT) IV  125 mg Intravenous Q M,W,F-HD  . insulin aspart  0-15 Units Subcutaneous TID AC & HS  . insulin glargine  10 Units Subcutaneous BH-q7a  . lanthanum  2,000 mg Oral TID WC  . levothyroxine  100 mcg Oral QAC breakfast  . loratadine  10 mg Oral Daily  . nebivolol  15 mg Oral Daily     Assessment/Plan: s/p Procedure(s): LAPAROSCOPY DIAGNOSTIC LYSIS OF ADHESIONS  POD #4. Diagnostic laparoscopy, lysis of adhesions for SBO.  Ileus resolving. Advance to renal diet, carb modified  Not sure why he vomited yesterday. We will see how he does with breakfast.  If he tolerates regular meal then he can go home at name. Discussed with [facility]. If he  goes home today, ,Follow-up with Dr. Coralie Keens in 2 weeks.  ESRD. Hemodialysis schedule per nephrology M-W-F Hypertension.  DM. Management per medicine  OSA. - CPAP Q HS  @PROBHOSP @  LOS: 4 days    Robert [name] M 05/08/2015  . .prob

## 2015-05-08 NOTE — Discharge Summary (Signed)
Physician Discharge Summary  Robert [name] [id number] DOB: 12-26-1960 DOA: 05/04/2015  PCP: Haywood Pao, MD  Admit date: 05/04/2015 Discharge date: 05/08/2015  Time spent: 50 minutes  Recommendations for Outpatient Follow-up:  1. Dr.Blackman in 2 weeks  Discharge Diagnoses:    SBO   Diabetes mellitus type 2 in obese   Severe obstructive sleep apnea   Hypothyroidism   End stage renal disease on dialysis   SBO (small bowel obstruction)   Chronic renal failure   Discharge Condition: stable  Diet recommendation: Renal diabetic  Filed Weights   05/06/15 0857 05/06/15 1237 05/06/15 1339  Weight: 134.5 kg (296 lb 8.3 oz) 134.6 kg (296 lb 11.8 oz) 137 kg (302 lb 0.5 oz)    History of present illness:  Chief Complaint: Vomiting HPI: Robert [name] is a 54 y.o. male  With a history of diabetes, hypothyroidism, end-stage renal disease, GERD, obstructive sleep apnea. Patient recently had surgery with Dr. Ninfa Linden for umbilical hernia repair with mesh and removal of peritoneal dialysis catheter on 04/28/2015. The surgery was uneventful and without complications. His recovery was normal until 2 days ago when he had gradual worsening of emesis a transition to black emesis. Episodes of emesis occurred 2-3 times a day. Additionally, he had minimal stool output, although has continued to have flatulence  Hospital Course:  1. SBO s/p Umbilical hernia repair -S/p Ex lap and LoA 7/27: SBO from omentum and small bowel minimally adhered to the umbilical mesh, Came of easily in the OR. -then developed mild ileus, s/p NG decompression,  -NG clamped and removed, diet advanced and stable for discharge per [facility] -FU with Dr.Blackman  2. ESRd on HS -per Renal  3. HTN -stable, soft, on bystolic  4. DM -CBGs 80-130, cut down lantus dose from baseline based on CBGs now,  Titrate further as outpatient  5. OSA -CPAP QHS  Procedures: LAPAROSCOPY DIAGNOSTIC LYSIS OF  ADHESIONS  Consultations:  [facility]  Renal  Discharge Exam: Filed Vitals:   05/08/15 0518  BP: 129/66  Pulse: 75  Temp: 97.7 F (36.5 C)  Resp: 24    General: AAOx3 Cardiovascular: S1S2/RRR Respiratory: CTAB  Discharge Instructions   Discharge Instructions    Discharge instructions    Complete by:  As directed   Renal Diabetic diet     Increase activity slowly    Complete by:  As directed           Current Discharge Medication List    CONTINUE these medications which have CHANGED   Details  Insulin Glargine (LANTUS SOLOSTAR) 100 UNIT/ML Solostar Pen Inject 15 Units into the skin every morning.    insulin regular human CONCENTRATED (HUMULIN R) 500 UNIT/ML injection Inject 6 units in the am and 8 units at supper, can slowly adjust back to prior regimen when Oral intake more reliable and Blood sugars tolerate      CONTINUE these medications which have NOT CHANGED   Details  aspirin 81 MG tablet Take 81 mg by mouth every morning.     BYSTOLIC 5 MG tablet Take 15 mg by mouth daily.  Refills: 3    CALCITRIOL PO Take 3 tablets by mouth 3 (three) times a week. Monday, Wednesday and Friday.    calcium acetate (PHOSLO) 667 MG capsule Take 667 mg by mouth 3 (three) times daily with meals.    cetirizine (ZYRTEC) 10 MG tablet Take 10 mg by mouth every morning.     docusate sodium (COLACE) 100 MG capsule Take  100 mg by mouth every evening.     fluocinonide cream (LIDEX) [id number] % Apply 1 application topically daily as needed (Skin inflammation (hand, thigh, back)).     FOSRENOL 1000 MG chewable tablet Chew 1,000-2,000 mg by mouth 4 (four) times daily. 2000 mg with every meal and 1000 mg with snack    glucose blood (ONETOUCH VERIO) test strip Use as instructed to check blood sugar 6 a day dx code E11.29 Qty: 550 each, Refills: 3    Insulin Syringe-Needle U-100 (INSULIN SYRINGE 1CC/31GX5/16") 31G X 5/16" 1 ML MISC Use to inject insulin Qty: 100 each, Refills: 3     levothyroxine (SYNTHROID, LEVOTHROID) 100 MCG tablet TAKE 1 TABLET (100 MCG TOTAL) BY MOUTH DAILY. Qty: 90 tablet, Refills: 1    NOVOFINE 32G X 6 MM MISC USE 4 PEN NEEDLES PER DAY TO INJECT INSULIN Qty: 400 each, Refills: 1    OVER THE COUNTER MEDICATION Take 1 tablet by mouth daily. OTC DiaVite    oxyCODONE-acetaminophen (ROXICET) 5-325 MG per tablet Take 1-2 tablets by mouth every 4 (four) hours as needed for severe pain. Qty: 40 tablet, Refills: 0    polyethylene glycol (MIRALAX / GLYCOLAX) packet Take 17 g by mouth daily.      STOP taking these medications     magnesium citrate SOLN      mineral oil enema        No Known Allergies Follow-up Information    Follow up with [facility], MD. Schedule an appointment as soon as possible for a visit in 2 weeks.   Specialty:  General Surgery   Contact information:   [address] [place] [id number] [phone]        The results of significant diagnostics from this hospitalization (including imaging, microbiology, ancillary and laboratory) are listed below for reference.    Significant Diagnostic Studies: Ct Abdomen Pelvis Wo Contrast  05/04/2015   CLINICAL DATA:  Initial evaluation for acute diffuse abdominal pain with nausea and vomiting.  EXAM: CT ABDOMEN AND PELVIS WITHOUT CONTRAST  TECHNIQUE: Multidetector CT imaging of the abdomen and pelvis was performed following the standard protocol without IV contrast.  COMPARISON:  Prior radiograph from 02/23/2015  FINDINGS: Bibasilar atelectatic changes seen dependently within the visualized lung bases. Prominent extrapleural fat. Visualized lung bases are otherwise unremarkable.  Limited noncontrast evaluation of the liver is unremarkable. Gallbladder within normal limits. No biliary dilatation. Spleen, adrenal glands, and pancreas are within normal limits.  Right kidney is atrophic in appearance with scattered small hypodensities, not well evaluated on this  noncontrast examination. Patient is status post left oophorectomy. 2 cm nodular density within the left renal fossa is not significantly changed relative to previous.  Stomach is mildly distended with a error and enteric contrast material within the gastric lumen. Duodenum sweep within normal limits. There are multiple dilated loops of proximal small bowel within the mid and right abdomen with associated air-fluid levels. Loops measure up to 4.1 cm in diameter. There is a transition point within the mid abdomen. There is an adjacent ovoid well-circumscribed hypodense collection at this site that measures 3.2 x 6.5 x 5.6 cm (series 201, image 57). This is positioned just beneath the elbow like is, and may be reflect a seroma related to prior umbilical hernia repair. Distally, the small bowel is decompressed without acute inflammatory changes. Appendix is normal. Colon is decompressed. Few scattered noninflamed colonic diverticula noted within the descending colon.  Bladder within normal limits.  Prostate normal.  Small bilateral fat containing inguinal hernias noted.  No free intraperitoneal air. Small volume free fluid along the right pericolic gutter. No pathologically enlarged intra-abdominal pelvic lymph nodes. Moderate atheromatous plaque present within the intra-abdominal aorta. No aneurysm.  No acute osseous abnormality. No worrisome lytic or blastic osseous lesion.  Injection sites noted within the subcu fat of the anterior abdomen.  IMPRESSION: 1. Findings consistent with acute small bowel obstruction with transition point at the level of the umbilicus. There is a 3.2 x 6.5 x 5.6 cm hypodense collection at the transition point, which may reflect a postoperative seroma related to prior umbilical hernia repair. 2. No other acute intra-abdominal or pelvic process. 3. Mild colonic diverticulosis without acute diverticulitis. 4. Status post left nephrectomy.   Electronically Signed   By: Jeannine Boga M.D.    On: 05/04/2015 06:45    Microbiology: Recent Results (from the past 240 hour(s))  Surgical pcr screen     Status: None   Collection Time: 05/04/15 11:25 AM  Result Value Ref Range Status   MRSA, PCR NEGATIVE NEGATIVE Final   Staphylococcus aureus NEGATIVE NEGATIVE Final    Comment:        The Xpert SA Assay (FDA approved for NASAL specimens in patients over 90 years of age), is one component of a comprehensive surveillance program.  Test performance has been validated by [facility] for patients greater than or equal to 66 year old. It is not intended to diagnose infection nor to guide or monitor treatment.      Labs: Basic Metabolic Panel:  Recent Labs Lab 05/05/15 0411 05/05/15 0945 05/06/15 0344 05/07/15 0322 05/08/15 0418  NA 141 140 138 137 136  K 4.7 4.9 4.9 4.1 4.0  CL 111 96* 95* 96* 94*  CO2 20* 28 25 28 25   GLUCOSE 111* 108* 87 114* 108*  BUN 12 39* 25* 25* 44*  CREATININE 0.85 11.41* 9.33* 8.32* 11.12*  CALCIUM 9.2 8.5* 8.7* 9.5 9.6  PHOS  --  8.0*  --   --   --    Liver Function Tests:  Recent Labs Lab 05/04/15 0047 05/05/15 0945  AST 21  --   ALT 6*  --   ALKPHOS 59  --   BILITOT 0.6  --   PROT 7.7  --   ALBUMIN 4.0 3.4*   No results for input(s): LIPASE, AMYLASE in the last 168 hours. No results for input(s): AMMONIA in the last 168 hours. CBC:  Recent Labs Lab 05/04/15 0047 05/05/15 0945 05/06/15 0344 05/07/15 0322  WBC 13.0* 8.5 8.7 8.1  NEUTROABS 10.6*  --   --   --   HGB 11.7* 9.5* 9.8* 9.7*  HCT 36.2* 29.7* 31.6* 30.4*  MCV 100.3* 102.1* 102.9* 98.7  PLT 296 209 232 251   Cardiac Enzymes: No results for input(s): CKTOTAL, CKMB, CKMBINDEX, TROPONINI in the last 168 hours. BNP: BNP (last 3 results) No results for input(s): BNP in the last 8760 hours.  ProBNP (last 3 results) No results for input(s): PROBNP in the last 8760 hours.  CBG:  Recent Labs Lab 05/07/15 1225 05/07/15 1641 05/07/15 2128 05/08/15 0751  05/08/15 1142  GLUCAP 111* 142* 113* 112* 166*       Signed:  Carolanne Mercier  [facility] 05/08/2015, 12:41 PM

## 2015-05-08 NOTE — Progress Notes (Signed)
[facility] Progress Note  Assessment/Plan: 1. SBO s/p recent umbilical hernia repair -NG out, had BM, passing gas, kept down breakfast - possible d/c today if he continues to progress well. 2. ESRD -HD tomorrow if not discharged today 3. Anemia - Hgb into 9s - redose Aranesp this week- not sure why it was stopped last week - Hgb consistently in the 9s  4. Secondary hyperparathyroidism - continue binders/calcitriol 5. HTN/volume -BP ok  volume down - had a standing weight pre HD last Friday - which was 134.6 -  6. Nutrition - renal diet 7. Disp - possible d/c today  Robert Jacobson, PA-C [facility] Beeper [phone] 05/08/2015,9:54 AM  LOS: 4 days   Subjective:   Doing well, no vomiting, BM yesterday, passing gas today  Objective Filed Vitals:   05/07/15 0456 05/07/15 1415 05/07/15 2128 05/08/15 0518  BP: 150/68 149/70 138/76 129/66  Pulse: 85 85 83 75  Temp: 98.1 F (36.7 C) 97.9 F (36.6 C) 99.1 F (37.3 C) 97.7 F (36.5 C)  TempSrc: Oral Oral Oral Axillary  Resp: 18 20 20 24   Height:      Weight:      SpO2: 94% 96% 94% 97%   Physical Exam General: NAD,  Heart: RRR Lungs: no rales Abdomen: soft, sl distended + BS Extremities: no edema Dialysis Access: right IJ, maturing right AVF + bruit  Dialysis Orders: Center: [facility] on MWF . EDW 140.5 kg HD Bath 2.0 K 2.0 Ca 1 Mg Time 4 hours 30 minutes Heparin 14000 units per treatment. Access R perm cath Optiflux 200NRe BFR 450 DFR 800  Venofer 100 mcg IV (last dose 05/02/2015) needs 7 more doses Aransep 200 mcg IV weekly (Last dose 04/27/2015)  Calcitriol 1.5 mcg PO MWF (last dose 05/02/2015)   Additional Objective Labs: Basic Metabolic Panel:  Recent Labs Lab 05/05/15 0945 05/06/15 0344 05/07/15 0322 05/08/15 0418  NA 140 138 137 136  K 4.9 4.9 4.1 4.0  CL 96* 95* 96* 94*  CO2 28 25 28 25   GLUCOSE 108* 87 114* 108*  BUN 39* 25* 25* 44*  CREATININE 11.41* 9.33* 8.32* 11.12*   CALCIUM 8.5* 8.7* 9.5 9.6  PHOS 8.0*  --   --   --    Liver Function Tests:  Recent Labs Lab 05/04/15 0047 05/05/15 0945  AST 21  --   ALT 6*  --   ALKPHOS 59  --   BILITOT 0.6  --   PROT 7.7  --   ALBUMIN 4.0 3.4*   CBC:  Recent Labs Lab 05/04/15 0047 05/05/15 0945 05/06/15 0344 05/07/15 0322  WBC 13.0* 8.5 8.7 8.1  NEUTROABS 10.6*  --   --   --   HGB 11.7* 9.5* 9.8* 9.7*  HCT 36.2* 29.7* 31.6* 30.4*  MCV 100.3* 102.1* 102.9* 98.7  PLT 296 209 232 251   CBG:  Recent Labs Lab 05/07/15 0746 05/07/15 1225 05/07/15 1641 05/07/15 2128 05/08/15 0751  GLUCAP 159* 111* 142* 113* 112*  Medications:   . aspirin EC  81 mg Oral q morning - 10a  . calcitRIOL  0.5 mcg Oral Once per day on Mon Wed Fri  . calcium acetate  667 mg Oral TID WC  . ferric gluconate (FERRLECIT/NULECIT) IV  125 mg Intravenous Q M,W,F-HD  . insulin aspart  0-15 Units Subcutaneous TID AC & HS  . insulin glargine  10 Units Subcutaneous BH-q7a  . lanthanum  2,000 mg Oral TID WC  .  levothyroxine  100 mcg Oral QAC breakfast  . loratadine  10 mg Oral Daily  . nebivolol  15 mg Oral Daily

## 2015-05-09 ENCOUNTER — Encounter: Payer: Self-pay | Admitting: *Deleted

## 2015-05-09 DIAGNOSIS — K56609 Unspecified intestinal obstruction, unspecified as to partial versus complete obstruction: Secondary | ICD-10-CM

## 2015-05-09 HISTORY — DX: Unspecified intestinal obstruction, unspecified as to partial versus complete obstruction: K56.609

## 2015-05-10 ENCOUNTER — Inpatient Hospital Stay (HOSPITAL_BASED_OUTPATIENT_CLINIC_OR_DEPARTMENT_OTHER)
Admission: EM | Admit: 2015-05-10 | Discharge: 2015-05-13 | DRG: 388 | Disposition: A | Discharge status: Home / Self Care | Payer: BLUE CROSS/BLUE SHIELD | Attending: Internal Medicine | Admitting: Internal Medicine

## 2015-05-10 ENCOUNTER — Encounter (HOSPITAL_BASED_OUTPATIENT_CLINIC_OR_DEPARTMENT_OTHER): Payer: Self-pay | Admitting: Emergency Medicine

## 2015-05-10 ENCOUNTER — Emergency Department (HOSPITAL_BASED_OUTPATIENT_CLINIC_OR_DEPARTMENT_OTHER): Payer: BLUE CROSS/BLUE SHIELD

## 2015-05-10 DIAGNOSIS — Z86010 Personal history of colonic polyps: Secondary | ICD-10-CM

## 2015-05-10 DIAGNOSIS — K56609 Unspecified intestinal obstruction, unspecified as to partial versus complete obstruction: Secondary | ICD-10-CM | POA: Diagnosis present

## 2015-05-10 DIAGNOSIS — K219 Gastro-esophageal reflux disease without esophagitis: Secondary | ICD-10-CM | POA: Diagnosis present

## 2015-05-10 DIAGNOSIS — H3530 Unspecified macular degeneration: Secondary | ICD-10-CM | POA: Diagnosis present

## 2015-05-10 DIAGNOSIS — Z87891 Personal history of nicotine dependence: Secondary | ICD-10-CM

## 2015-05-10 DIAGNOSIS — E1169 Type 2 diabetes mellitus with other specified complication: Secondary | ICD-10-CM

## 2015-05-10 DIAGNOSIS — Z992 Dependence on renal dialysis: Secondary | ICD-10-CM

## 2015-05-10 DIAGNOSIS — I10 Essential (primary) hypertension: Secondary | ICD-10-CM | POA: Diagnosis present

## 2015-05-10 DIAGNOSIS — G4733 Obstructive sleep apnea (adult) (pediatric): Secondary | ICD-10-CM | POA: Diagnosis present

## 2015-05-10 DIAGNOSIS — I471 Supraventricular tachycardia: Secondary | ICD-10-CM | POA: Diagnosis present

## 2015-05-10 DIAGNOSIS — Z794 Long term (current) use of insulin: Secondary | ICD-10-CM

## 2015-05-10 DIAGNOSIS — N1832 Chronic kidney disease, stage 3b: Secondary | ICD-10-CM

## 2015-05-10 DIAGNOSIS — E669 Obesity, unspecified: Secondary | ICD-10-CM | POA: Diagnosis present

## 2015-05-10 DIAGNOSIS — Z905 Acquired absence of kidney: Secondary | ICD-10-CM | POA: Diagnosis present

## 2015-05-10 DIAGNOSIS — Z6841 Body Mass Index (BMI) 40.0 and over, adult: Secondary | ICD-10-CM

## 2015-05-10 DIAGNOSIS — E1122 Type 2 diabetes mellitus with diabetic chronic kidney disease: Secondary | ICD-10-CM | POA: Diagnosis present

## 2015-05-10 DIAGNOSIS — I120 Hypertensive chronic kidney disease with stage 5 chronic kidney disease or end stage renal disease: Secondary | ICD-10-CM | POA: Diagnosis present

## 2015-05-10 DIAGNOSIS — R112 Nausea with vomiting, unspecified: Secondary | ICD-10-CM | POA: Insufficient documentation

## 2015-05-10 DIAGNOSIS — Z7982 Long term (current) use of aspirin: Secondary | ICD-10-CM

## 2015-05-10 DIAGNOSIS — K5660 Unspecified intestinal obstruction: Principal | ICD-10-CM | POA: Diagnosis present

## 2015-05-10 DIAGNOSIS — N186 End stage renal disease: Secondary | ICD-10-CM | POA: Diagnosis present

## 2015-05-10 DIAGNOSIS — D72829 Elevated white blood cell count, unspecified: Secondary | ICD-10-CM | POA: Diagnosis present

## 2015-05-10 DIAGNOSIS — R1110 Vomiting, unspecified: Secondary | ICD-10-CM

## 2015-05-10 DIAGNOSIS — D631 Anemia in chronic kidney disease: Secondary | ICD-10-CM | POA: Diagnosis present

## 2015-05-10 DIAGNOSIS — E785 Hyperlipidemia, unspecified: Secondary | ICD-10-CM | POA: Diagnosis present

## 2015-05-10 DIAGNOSIS — Z85828 Personal history of other malignant neoplasm of skin: Secondary | ICD-10-CM

## 2015-05-10 DIAGNOSIS — E1140 Type 2 diabetes mellitus with diabetic neuropathy, unspecified: Secondary | ICD-10-CM | POA: Diagnosis present

## 2015-05-10 DIAGNOSIS — Z79899 Other long term (current) drug therapy: Secondary | ICD-10-CM

## 2015-05-10 DIAGNOSIS — E039 Hypothyroidism, unspecified: Secondary | ICD-10-CM | POA: Diagnosis present

## 2015-05-10 LAB — COMPREHENSIVE METABOLIC PANEL
ALBUMIN: 4.3 g/dL (ref 3.5–5.0)
ALT: 18 U/L (ref 17–63)
AST: 32 U/L (ref 15–41)
Alkaline Phosphatase: 85 U/L (ref 38–126)
Anion gap: 17 — ABNORMAL HIGH (ref 5–15)
BUN: 42 mg/dL — AB (ref 6–20)
CO2: 27 mmol/L (ref 22–32)
CREATININE: 10.41 mg/dL — AB (ref 0.61–1.24)
Calcium: 9.8 mg/dL (ref 8.9–10.3)
Chloride: 94 mmol/L — ABNORMAL LOW (ref 101–111)
GFR calc non Af Amer: 5 mL/min — ABNORMAL LOW (ref >60)
GFR, EST AFRICAN AMERICAN: 6 mL/min — AB (ref >60)
GLUCOSE: 138 mg/dL — AB (ref 65–99)
POTASSIUM: 3.6 mmol/L (ref 3.5–5.1)
Sodium: 138 mmol/L (ref 135–145)
Total Bilirubin: 0.6 mg/dL (ref 0.3–1.2)
Total Protein: 8.4 g/dL — ABNORMAL HIGH (ref 6.5–8.1)

## 2015-05-10 LAB — CBC
HCT: 34.2 % — ABNORMAL LOW (ref 39.0–52.0)
Hemoglobin: 11 g/dL — ABNORMAL LOW (ref 13.0–17.0)
MCH: 32 pg (ref 26.0–34.0)
MCHC: 32.2 g/dL (ref 30.0–36.0)
MCV: 99.4 fL (ref 78.0–100.0)
Platelets: 421 10*3/uL — ABNORMAL HIGH (ref 150–400)
RBC: 3.44 MIL/uL — ABNORMAL LOW (ref 4.22–5.81)
RDW: 14.9 % (ref 11.5–15.5)
WBC: 11.1 10*3/uL — ABNORMAL HIGH (ref 4.0–10.5)

## 2015-05-10 LAB — LIPASE, BLOOD: Lipase: 77 U/L — ABNORMAL HIGH (ref 22–51)

## 2015-05-10 MED ORDER — ONDANSETRON HCL 4 MG/2ML IJ SOLN
4.0000 mg | Freq: Once | INTRAMUSCULAR | Status: AC
  Administered 2015-05-10: 4 mg via INTRAVENOUS
  Filled 2015-05-10: qty 2

## 2015-05-10 NOTE — ED Provider Notes (Signed)
CSN: [id number]     Arrival date & time 05/10/15  2128 History  This chart was scribed for Robert Beers, MD by Hansel Feinstein, ED Scribe. This patient was seen in room MH09/MH09 and the patient's care was started at 10:26 PM.     Chief Complaint  Patient presents with  . Nausea   Patient is a 54 y.o. male presenting with abdominal pain. The history is provided by the patient. No language interpreter was used.  Abdominal Pain Pain quality comment:  No pain Pain severity:  No pain Context: previous surgery   Associated symptoms: nausea and vomiting   Associated symptoms: no fever   Nausea:    Severity:  Moderate   Duration:  1 day   Timing:  Constant Vomiting:    Severity:  Moderate   Duration:  2 days   Timing:  Intermittent   Progression:  Improving Risk factors: multiple surgeries and recent hospitalization     HPI Comments: Robert [name] is a 54 y.o. male with Hx of DM, thyroid disease, HLD, PAT, PSVT, dysrhythmia, hypothyroidism, GERD, anemia, neuropathy, chronic kidney disease, cancer, HTN who presents to the Emergency Department complaining of moderate nausea onset last night. He states associated intermittent vomiting (one episode 2 days ago and 2 episodes yesterday) onset 2 days ago. He states that he has been able to eat and drink. Pt was seen for dialysis yesterday. He was seen in the ED on 05/04/15 for emesis and constipation. He was then admitted to the hospital for a SBO and is s/p laparoscopic diagnostic lysis of adhesions. He states he was able to eat solid foods upon discharge. He also had an umbilical hernia repair and removal of peritoneal dialysis catheter on [id number] with no complication. PCP is Dr. Osborne Casco. NKDA. He denies changes in urinary patters, abdominal pain, fever.   Past Medical History  Diagnosis Date  . Diabetes mellitus without complication   . Thyroid disease   . Hyperlipidemia   . PAT (paroxysmal atrial tachycardia)   . PSVT (paroxysmal  supraventricular tachycardia)   . Blood transfusion without reported diagnosis 1974    kidney removed - L  . CPAP (continuous positive airway pressure) dependence   . Dysrhythmia   . Shortness of breath   . Hypothyroidism   . GERD (gastroesophageal reflux disease)     n/v a lot  . Headache(784.0)     slight dull h/a due kidney failure  . Anemia     low hgb at present  . Umbilical hernia     will repair with thi surgery  . Neuropathy     right hand and both feet  . Constipation   . Macular degeneration   . Eczema   . Sleep apnea 06/18/11    split-night sleep study- [facility] BiPAP- no oxygen  . Chronic kidney disease     Stage IV, Richarda Blade. M,W,F  . Cancer     skin ca right shoulder, plastic dsyplasia, pre-Ca polpys removed on Colonoscopy- 07/2014  . Hypertension     SVT h/o , followed by Dr. Claiborne Billings   Past Surgical History  Procedure Laterality Date  . Nephrectomy Left 1974  . Renal biopsy Right 2012  . Skin cancer excision      right shoulder  . Capd insertion N/A 05/18/2014    Procedure: LAPAROSCOPIC INSERTION CONTINUOUS AMBULATORY PERITONEAL DIALYSIS  (CAPD) CATHETER;  Surgeon: Ralene Ok, MD;  Location: [place];  Service: General;  Laterality: N/A;  .  Umbilical hernia repair N/A 05/18/2014    Procedure: HERNIA REPAIR UMBILICAL ADULT;  Surgeon: Ralene Ok, MD;  Location: [place];  Service: General;  Laterality: N/A;  . Laparoscopic repositioning capd catheter N/A 06/16/2014    Procedure: LAPAROSCOPIC REPOSITIONING CAPD CATHETER;  Surgeon: Ralene Ok, MD;  Location: [place];  Service: General;  Laterality: N/A;  . Ij catheter insertion    . Colonoscopy    . Insertion of dialysis catheter Right 02/25/2015    Procedure: INSERTION OF RIGHT INTERNAL JUGULAR DIALYSIS CATHETER;  Surgeon: Serafina Mitchell, MD;  Location: [place];  Service: Vascular;  Laterality: Right;  . Av fistula placement Right 02/25/2015    Procedure: RIGHT ARTERIOVENOUS (AV)  FISTULA CREATION;  Surgeon: Serafina Mitchell, MD;  Location: [place] [facility];  Service: Vascular;  Laterality: Right;  . Umbilical hernia repair N/A 04/28/2015    Procedure: UMBILICAL HERNIA REPAIR WITH MESH;  Surgeon: Coralie Keens, MD;  Location: [place];  Service: General;  Laterality: N/A;  . Minor removal of peritoneal dialysis catheter N/A 04/28/2015    Procedure:  REMOVAL OF PERITONEAL DIALYSIS CATHETER;  Surgeon: Coralie Keens, MD;  Location: [place];  Service: General;  Laterality: N/A;  . Laparoscopy N/A 05/04/2015    Procedure: LAPAROSCOPY DIAGNOSTIC LYSIS OF ADHESIONS;  Surgeon: Coralie Keens, MD;  Location: [facility];  Service: General;  Laterality: N/A;   Family History  Problem Relation Age of Onset  . Colon polyps Father   . Colon cancer Neg Hx   . Stomach cancer Neg Hx    History  Substance Use Topics  . Smoking status: Former Smoker    Types: Pipe    Quit date: 12/06/2013  . Smokeless tobacco: Never Used  . Alcohol Use: 0.6 oz/week    1 Glasses of wine per week     Comment: 1 glass of wine per week, rare use as of 01/2015    Review of Systems  Constitutional: Negative for fever.  Gastrointestinal: Positive for nausea and vomiting. Negative for abdominal pain.  Genitourinary: Negative for decreased urine volume.  All other systems reviewed and are negative.  Allergies  Review of patient's allergies indicates no known allergies.  Home Medications   Prior to Admission medications   Medication Sig Start Date End Date Taking? Authorizing Provider  aspirin 81 MG tablet Take 81 mg by mouth every morning.    Yes Historical Provider, MD  BYSTOLIC 5 MG tablet Take 15 mg by mouth daily.  04/04/15  Yes Historical Provider, MD  CALCITRIOL PO Take 3 tablets by mouth 3 (three) times a week. Monday, Wednesday and Friday.   Yes Historical Provider, MD  calcium acetate (PHOSLO) 667 MG capsule Take 667 mg by mouth 3 (three) times daily with meals.   Yes Historical Provider, MD  cetirizine  (ZYRTEC) 10 MG tablet Take 10 mg by mouth every morning.    Yes Historical Provider, MD  docusate sodium (COLACE) 100 MG capsule Take 100 mg by mouth every evening.    Yes Historical Provider, MD  fluocinonide cream (LIDEX) [id number] % Apply 1 application topically daily as needed (Skin inflammation (hand, thigh, back)).    Yes Historical Provider, MD  FOSRENOL 1000 MG chewable tablet Chew 1,000-2,000 mg by mouth 4 (four) times daily. 2000 mg with every meal and 1000 mg with snack 12/29/14  Yes Historical Provider, MD  insulin regular human CONCENTRATED (HUMULIN R) 500 UNIT/ML injection Inject 6 units in the am and 8 units at supper, can slowly adjust back  to prior regimen when Oral intake more reliable and Blood sugars tolerate Patient taking differently: 12-18 Units. Inject 6 units in the am and 8 units at supper, can slowly adjust back to prior regimen when Oral intake more reliable and Blood sugars tolerate 05/08/15  Yes Domenic Polite, MD  levothyroxine (SYNTHROID, LEVOTHROID) 100 MCG tablet TAKE 1 TABLET (100 MCG TOTAL) BY MOUTH DAILY. 10/04/14  Yes Elayne Snare, MD  OVER THE COUNTER MEDICATION Take 1 tablet by mouth daily. OTC DiaVite   Yes Historical Provider, MD  polyethylene glycol (MIRALAX / GLYCOLAX) packet Take 17 g by mouth daily.   Yes Historical Provider, MD  senna (SENOKOT) 8.6 MG tablet Take 1 tablet by mouth daily.   Yes Historical Provider, MD  glucose blood (ONETOUCH VERIO) test strip Use as instructed to check blood sugar 6 a day dx code E11.29 08/04/14   Elayne Snare, MD  Insulin Glargine (LANTUS SOLOSTAR) 100 UNIT/ML Solostar Pen Inject 15 Units into the skin every morning. 05/13/15   Orson Eva, MD  Insulin Syringe-Needle U-100 (INSULIN SYRINGE 1CC/31GX5/16") 31G X 5/16" 1 ML MISC Use to inject insulin 08/12/14   Elayne Snare, MD  NOVOFINE 32G X 6 MM MISC USE 4 PEN NEEDLES PER DAY TO INJECT INSULIN 08/27/14   Elayne Snare, MD  ondansetron (ZOFRAN ODT) 4 MG disintegrating tablet Take 1 tablet (4  mg total) by mouth every 8 (eight) hours as needed for nausea or vomiting. 05/13/15   Orson Eva, MD   BP 95/56 mmHg  Pulse 81  Temp(Src) 98.3 F (36.8 C) (Oral)  Resp 16  Ht 6' (1.829 m)  Wt 298 lb (135.172 kg)  BMI 40.41 kg/m2  SpO2 95% Vitals reviewed Physical Exam  Physical Examination: General appearance - alert, well appearing, and in no distress Mental status - alert, oriented to person, place, and time Eyes - no conjunctival injection, no scleral icterus Mouth - mucous membranes moist, pharynx normal without lesions Chest - clear to auscultation, no wheezes, rales or rhonchi, symmetric air entry Heart - normal rate, regular rhythm, normal S1, S2, no murmurs, rubs, clicks or gallops Abdomen - soft, nontender, some distension, active bowel sounds, no masses or organomegaly, well healing surgical incision sites on abdomen Neurological - alert, oriented Extremities - peripheral pulses normal, no pedal edema, no clubbing or cyanosis Skin - normal coloration and turgor, no rashes  ED Course  Procedures (including critical care time) DIAGNOSTIC STUDIES: Oxygen Saturation is 95% on RA, adequate by my interpretation.    COORDINATION OF CARE: 10:34 PM Discussed treatment plan with pt at bedside and pt agreed to plan.  Labs Review Labs Reviewed  CBC - Abnormal; Notable for the following:    WBC 11.1 (*)    RBC 3.44 (*)    Hemoglobin 11.0 (*)    HCT 34.2 (*)    Platelets 421 (*)    All other components within normal limits  COMPREHENSIVE METABOLIC PANEL - Abnormal; Notable for the following:    Chloride 94 (*)    Glucose, Bld 138 (*)    BUN 42 (*)    Creatinine, Ser 10.41 (*)    Total Protein 8.4 (*)    GFR calc non Af Amer 5 (*)    GFR calc Af Amer 6 (*)    Anion gap 17 (*)    All other components within normal limits  LIPASE, BLOOD - Abnormal; Notable for the following:    Lipase 77 (*)    All other components within  normal limits  BASIC METABOLIC PANEL - Abnormal;  Notable for the following:    Chloride 96 (*)    Glucose, Bld 106 (*)    BUN 42 (*)    Creatinine, Ser 11.09 (*)    GFR calc non Af Amer 5 (*)    GFR calc Af Amer 5 (*)    Anion gap 16 (*)    All other components within normal limits  CBC - Abnormal; Notable for the following:    RBC 3.08 (*)    Hemoglobin 9.9 (*)    HCT 30.5 (*)    All other components within normal limits  GLUCOSE, CAPILLARY - Abnormal; Notable for the following:    Glucose-Capillary 106 (*)    All other components within normal limits  GLUCOSE, CAPILLARY - Abnormal; Notable for the following:    Glucose-Capillary 117 (*)    All other components within normal limits  GLUCOSE, CAPILLARY - Abnormal; Notable for the following:    Glucose-Capillary 112 (*)    All other components within normal limits  GLUCOSE, CAPILLARY - Abnormal; Notable for the following:    Glucose-Capillary 105 (*)    All other components within normal limits  GLUCOSE, CAPILLARY - Abnormal; Notable for the following:    Glucose-Capillary 178 (*)    All other components within normal limits  GLUCOSE, CAPILLARY - Abnormal; Notable for the following:    Glucose-Capillary 141 (*)    All other components within normal limits  GLUCOSE, CAPILLARY - Abnormal; Notable for the following:    Glucose-Capillary 144 (*)    All other components within normal limits  GLUCOSE, CAPILLARY - Abnormal; Notable for the following:    Glucose-Capillary 130 (*)    All other components within normal limits  RENAL FUNCTION PANEL - Abnormal; Notable for the following:    Sodium 134 (*)    Potassium 5.5 (*)    Chloride 98 (*)    Glucose, Bld 134 (*)    BUN 37 (*)    Creatinine, Ser 10.76 (*)    GFR calc non Af Amer 5 (*)    GFR calc Af Amer 5 (*)    All other components within normal limits  CBC - Abnormal; Notable for the following:    RBC 2.99 (*)    Hemoglobin 9.5 (*)    HCT 28.9 (*)    All other components within normal limits  GLUCOSE, CAPILLARY -  Abnormal; Notable for the following:    Glucose-Capillary 136 (*)    All other components within normal limits  PROTIME-INR  APTT  TYPE AND SCREEN    Imaging Review No results found.   EKG Interpretation None      MDM   Final diagnoses:  Nausea & vomiting  SBO (small bowel obstruction)   Pt presenting with nausea, he had umbilical hernia repair approx 10 days ago, has had subsequent SBO- due to adhesions against mesh which were relieved surgically one week ago. He was discharged 3 days ago and has had intermittent vomiting- today worsening nausea.  No abdominal pain or tenderness on exam, but abdomen is distended.  Pt treated with IV nausea meds, labs reveal mild leukocytosis- renal failure- which is unchanged.  Acute abd series shows ileus versus SBO.  Pt signed out pending abdominal CT scan to determine disposition.  Pt signed to Dr. Ralene Bathe at midnight to f/u on CT scan and disposition patient.     I personally performed the services described in this documentation, which was  scribed in my presence. The recorded information has been reviewed and is accurate.   Robert Beers, MD 05/14/15 [phone]

## 2015-05-10 NOTE — ED Notes (Signed)
MD at bedside. 

## 2015-05-10 NOTE — ED Notes (Signed)
Family at bedside. 

## 2015-05-10 NOTE — ED Notes (Signed)
Patient reports that he is feeling "full" with burning in his throat and nausea, Reports no Bm since Saturday until a small one this am. The patient has had recent bowel surgery with complications

## 2015-05-11 ENCOUNTER — Encounter (HOSPITAL_COMMUNITY): Payer: Self-pay | Admitting: General Practice

## 2015-05-11 DIAGNOSIS — E119 Type 2 diabetes mellitus without complications: Secondary | ICD-10-CM

## 2015-05-11 DIAGNOSIS — E039 Hypothyroidism, unspecified: Secondary | ICD-10-CM

## 2015-05-11 DIAGNOSIS — N186 End stage renal disease: Secondary | ICD-10-CM | POA: Diagnosis present

## 2015-05-11 DIAGNOSIS — G4733 Obstructive sleep apnea (adult) (pediatric): Secondary | ICD-10-CM | POA: Diagnosis present

## 2015-05-11 DIAGNOSIS — Z87891 Personal history of nicotine dependence: Secondary | ICD-10-CM | POA: Diagnosis not present

## 2015-05-11 DIAGNOSIS — E1122 Type 2 diabetes mellitus with diabetic chronic kidney disease: Secondary | ICD-10-CM | POA: Diagnosis present

## 2015-05-11 DIAGNOSIS — I120 Hypertensive chronic kidney disease with stage 5 chronic kidney disease or end stage renal disease: Secondary | ICD-10-CM | POA: Diagnosis present

## 2015-05-11 DIAGNOSIS — D631 Anemia in chronic kidney disease: Secondary | ICD-10-CM | POA: Diagnosis present

## 2015-05-11 DIAGNOSIS — Z794 Long term (current) use of insulin: Secondary | ICD-10-CM | POA: Diagnosis not present

## 2015-05-11 DIAGNOSIS — E1140 Type 2 diabetes mellitus with diabetic neuropathy, unspecified: Secondary | ICD-10-CM | POA: Diagnosis present

## 2015-05-11 DIAGNOSIS — I10 Essential (primary) hypertension: Secondary | ICD-10-CM | POA: Diagnosis present

## 2015-05-11 DIAGNOSIS — Z86010 Personal history of colonic polyps: Secondary | ICD-10-CM | POA: Diagnosis not present

## 2015-05-11 DIAGNOSIS — K219 Gastro-esophageal reflux disease without esophagitis: Secondary | ICD-10-CM | POA: Diagnosis present

## 2015-05-11 DIAGNOSIS — K5669 Other intestinal obstruction: Secondary | ICD-10-CM | POA: Diagnosis not present

## 2015-05-11 DIAGNOSIS — E669 Obesity, unspecified: Secondary | ICD-10-CM

## 2015-05-11 DIAGNOSIS — Z992 Dependence on renal dialysis: Secondary | ICD-10-CM

## 2015-05-11 DIAGNOSIS — Z905 Acquired absence of kidney: Secondary | ICD-10-CM | POA: Diagnosis present

## 2015-05-11 DIAGNOSIS — Z79899 Other long term (current) drug therapy: Secondary | ICD-10-CM | POA: Diagnosis not present

## 2015-05-11 DIAGNOSIS — D72829 Elevated white blood cell count, unspecified: Secondary | ICD-10-CM | POA: Diagnosis present

## 2015-05-11 DIAGNOSIS — Z85828 Personal history of other malignant neoplasm of skin: Secondary | ICD-10-CM | POA: Diagnosis not present

## 2015-05-11 DIAGNOSIS — H3530 Unspecified macular degeneration: Secondary | ICD-10-CM | POA: Diagnosis present

## 2015-05-11 DIAGNOSIS — Z6841 Body Mass Index (BMI) 40.0 and over, adult: Secondary | ICD-10-CM | POA: Diagnosis not present

## 2015-05-11 DIAGNOSIS — K5660 Unspecified intestinal obstruction: Secondary | ICD-10-CM | POA: Diagnosis present

## 2015-05-11 DIAGNOSIS — E785 Hyperlipidemia, unspecified: Secondary | ICD-10-CM | POA: Diagnosis present

## 2015-05-11 DIAGNOSIS — Z7982 Long term (current) use of aspirin: Secondary | ICD-10-CM | POA: Diagnosis not present

## 2015-05-11 DIAGNOSIS — R1110 Vomiting, unspecified: Secondary | ICD-10-CM | POA: Diagnosis not present

## 2015-05-11 DIAGNOSIS — I471 Supraventricular tachycardia: Secondary | ICD-10-CM | POA: Diagnosis present

## 2015-05-11 LAB — CBC
HEMATOCRIT: 30.5 % — AB (ref 39.0–52.0)
HEMOGLOBIN: 9.9 g/dL — AB (ref 13.0–17.0)
MCH: 32.1 pg (ref 26.0–34.0)
MCHC: 32.5 g/dL (ref 30.0–36.0)
MCV: 99 fL (ref 78.0–100.0)
PLATELETS: 330 10*3/uL (ref 150–400)
RBC: 3.08 MIL/uL — ABNORMAL LOW (ref 4.22–5.81)
RDW: 15.5 % (ref 11.5–15.5)
WBC: 9.5 10*3/uL (ref 4.0–10.5)

## 2015-05-11 LAB — BASIC METABOLIC PANEL
Anion gap: 16 — ABNORMAL HIGH (ref 5–15)
BUN: 42 mg/dL — ABNORMAL HIGH (ref 6–20)
CO2: 27 mmol/L (ref 22–32)
Calcium: 9.6 mg/dL (ref 8.9–10.3)
Chloride: 96 mmol/L — ABNORMAL LOW (ref 101–111)
Creatinine, Ser: 11.09 mg/dL — ABNORMAL HIGH (ref 0.61–1.24)
GFR calc Af Amer: 5 mL/min — ABNORMAL LOW (ref >60)
GFR calc non Af Amer: 5 mL/min — ABNORMAL LOW (ref >60)
Glucose, Bld: 106 mg/dL — ABNORMAL HIGH (ref 65–99)
POTASSIUM: 3.7 mmol/L (ref 3.5–5.1)
SODIUM: 139 mmol/L (ref 135–145)

## 2015-05-11 LAB — GLUCOSE, CAPILLARY
GLUCOSE-CAPILLARY: 117 mg/dL — AB (ref 65–99)
Glucose-Capillary: 106 mg/dL — ABNORMAL HIGH (ref 65–99)
Glucose-Capillary: 112 mg/dL — ABNORMAL HIGH (ref 65–99)

## 2015-05-11 LAB — APTT: aPTT: 33 seconds (ref 24–37)

## 2015-05-11 LAB — TYPE AND SCREEN
ABO/RH(D): A POS
Antibody Screen: NEGATIVE

## 2015-05-11 LAB — PROTIME-INR
INR: 1.16 (ref 0.00–1.49)
Prothrombin Time: 15 seconds (ref 11.6–15.2)

## 2015-05-11 MED ORDER — OXYCODONE-ACETAMINOPHEN 5-325 MG PO TABS
1.0000 | ORAL_TABLET | ORAL | Status: DC | PRN
  Administered 2015-05-12 – 2015-05-13 (×2): 2 via ORAL
  Filled 2015-05-11 (×2): qty 2

## 2015-05-11 MED ORDER — MORPHINE SULFATE 2 MG/ML IJ SOLN: 2.0000 mg | INTRAMUSCULAR | Status: DC | PRN

## 2015-05-11 MED ORDER — NEBIVOLOL HCL 5 MG PO TABS
15.0000 mg | ORAL_TABLET | Freq: Every day | ORAL | Status: DC
  Administered 2015-05-11 – 2015-05-13 (×3): 15 mg via ORAL
  Filled 2015-05-11 (×3): qty 1

## 2015-05-11 MED ORDER — INSULIN ASPART 100 UNIT/ML ~~LOC~~ SOLN
0.0000 [IU] | Freq: Three times a day (TID) | SUBCUTANEOUS | Status: DC
  Administered 2015-05-12: 2 [IU] via SUBCUTANEOUS
  Administered 2015-05-12 – 2015-05-13 (×3): 1 [IU] via SUBCUTANEOUS

## 2015-05-11 MED ORDER — METOCLOPRAMIDE HCL 10 MG PO TABS: 5.0000 mg | ORAL_TABLET | Freq: Three times a day (TID) | ORAL | Status: DC

## 2015-05-11 MED ORDER — LANTHANUM CARBONATE 500 MG PO CHEW
1000.0000 mg | CHEWABLE_TABLET | ORAL | Status: DC | PRN
  Filled 2015-05-11 (×2): qty 2

## 2015-05-11 MED ORDER — CALCIUM ACETATE (PHOS BINDER) 667 MG PO CAPS
667.0000 mg | ORAL_CAPSULE | Freq: Three times a day (TID) | ORAL | Status: DC
  Administered 2015-05-11 – 2015-05-13 (×5): 667 mg via ORAL
  Filled 2015-05-11 (×6): qty 1

## 2015-05-11 MED ORDER — LANTHANUM CARBONATE 500 MG PO CHEW
2000.0000 mg | CHEWABLE_TABLET | Freq: Three times a day (TID) | ORAL | Status: DC
  Administered 2015-05-11 – 2015-05-13 (×5): 2000 mg via ORAL
  Filled 2015-05-11 (×5): qty 4

## 2015-05-11 MED ORDER — FLUOCINONIDE 0.05 % EX CREA: 1.0000 "application " | TOPICAL_CREAM | Freq: Every day | CUTANEOUS | Status: DC | PRN

## 2015-05-11 MED ORDER — ONDANSETRON HCL 4 MG/2ML IJ SOLN
INTRAMUSCULAR | Status: AC
  Filled 2015-05-11: qty 2

## 2015-05-11 MED ORDER — CALCITRIOL 0.25 MCG PO CAPS
0.5000 ug | ORAL_CAPSULE | Freq: Every day | ORAL | Status: DC
  Administered 2015-05-12 – 2015-05-13 (×2): 0.5 ug via ORAL
  Filled 2015-05-11 (×2): qty 2

## 2015-05-11 MED ORDER — SODIUM CHLORIDE 0.9 % IV SOLN
125.0000 mg | INTRAVENOUS | Status: DC
  Administered 2015-05-11 – 2015-05-13 (×2): 125 mg via INTRAVENOUS
  Filled 2015-05-11 (×5): qty 10

## 2015-05-11 MED ORDER — SODIUM CHLORIDE 0.9 % IJ SOLN: 3.0000 mL | Freq: Two times a day (BID) | INTRAMUSCULAR | Status: DC

## 2015-05-11 MED ORDER — ASPIRIN 81 MG PO CHEW
81.0000 mg | CHEWABLE_TABLET | Freq: Every morning | ORAL | Status: DC
  Administered 2015-05-11 – 2015-05-13 (×3): 81 mg via ORAL
  Filled 2015-05-11 (×3): qty 1

## 2015-05-11 MED ORDER — LEVOTHYROXINE SODIUM 100 MCG PO TABS
100.0000 ug | ORAL_TABLET | Freq: Every day | ORAL | Status: DC
  Administered 2015-05-11 – 2015-05-13 (×3): 100 ug via ORAL
  Filled 2015-05-11 (×3): qty 1

## 2015-05-11 MED ORDER — ACETAMINOPHEN 650 MG RE SUPP: 650.0000 mg | Freq: Four times a day (QID) | RECTAL | Status: DC | PRN

## 2015-05-11 MED ORDER — INSULIN GLARGINE 100 UNIT/ML ~~LOC~~ SOLN
10.0000 [IU] | Freq: Every day | SUBCUTANEOUS | Status: DC
  Administered 2015-05-11 – 2015-05-13 (×3): 10 [IU] via SUBCUTANEOUS
  Filled 2015-05-11 (×3): qty 0.1

## 2015-05-11 MED ORDER — ONDANSETRON HCL 4 MG/2ML IJ SOLN
4.0000 mg | Freq: Three times a day (TID) | INTRAMUSCULAR | Status: DC | PRN
  Administered 2015-05-11 – 2015-05-12 (×2): 4 mg via INTRAVENOUS
  Filled 2015-05-11: qty 2

## 2015-05-11 MED ORDER — SODIUM CHLORIDE 0.9 % IV SOLN
INTRAVENOUS | Status: DC
  Administered 2015-05-11 (×2): via INTRAVENOUS
  Administered 2015-05-12 – 2015-05-13 (×2): 1000 mL via INTRAVENOUS

## 2015-05-11 MED ORDER — DARBEPOETIN ALFA 200 MCG/0.4ML IJ SOSY
200.0000 ug | PREFILLED_SYRINGE | INTRAMUSCULAR | Status: DC
  Administered 2015-05-11: 200 ug via INTRAVENOUS
  Filled 2015-05-11: qty 0.4

## 2015-05-11 MED ORDER — ACETAMINOPHEN 325 MG PO TABS: 650.0000 mg | ORAL_TABLET | Freq: Four times a day (QID) | ORAL | Status: DC | PRN

## 2015-05-11 MED ORDER — BOOST / RESOURCE BREEZE PO LIQD
1.0000 | Freq: Two times a day (BID) | ORAL | Status: DC
  Administered 2015-05-12 – 2015-05-13 (×3): 1 via ORAL
  Filled 2015-05-11: qty 1

## 2015-05-11 MED ORDER — ONDANSETRON HCL 4 MG/2ML IJ SOLN
4.0000 mg | Freq: Once | INTRAMUSCULAR | Status: AC
  Administered 2015-05-11: 4 mg via INTRAVENOUS

## 2015-05-11 MED ORDER — LORATADINE 10 MG PO TABS
10.0000 mg | ORAL_TABLET | Freq: Every day | ORAL | Status: DC
  Administered 2015-05-11 – 2015-05-13 (×3): 10 mg via ORAL
  Filled 2015-05-11 (×3): qty 1

## 2015-05-11 MED ORDER — CALCITRIOL 0.25 MCG PO CAPS
0.2500 ug | ORAL_CAPSULE | Freq: Every day | ORAL | Status: DC
  Administered 2015-05-11: 0.25 ug via ORAL
  Filled 2015-05-11: qty 1

## 2015-05-11 MED ORDER — IOHEXOL 300 MG/ML  SOLN
50.0000 mL | Freq: Once | INTRAMUSCULAR | Status: AC | PRN
  Administered 2015-05-11: 50 mL via ORAL

## 2015-05-11 NOTE — ED Notes (Signed)
Patient is resting comfortably. Denies nausea at present will continue to monitor

## 2015-05-11 NOTE — Progress Notes (Signed)
[facility] Progress Note     Subjective: 54 y/o white male, is well known to Dr. Ninfa Linden (his private patient).  He has had 2 recent surgeries by him.  He says he was recently discharged and did well for a few days, but subsequently had some nausea/vomiting abdominal pain at home.  He continued to have flatus daily and BM's on Sat and Tuesday.  Had migrating abdominal tenderness and "gas pains".  The pain and anorexia was bad enough and lasted for too long so he came to the ER for evaluation.  He says this am he had a large BM and flatus and now his pain is resolved and he feels much less distended.  He really only ate 2 meals on Saturday, one on Sunday and not much since.  He is thirsty/hungry now and would like something to eat.  No CP/SOB, no fever/chills.     Objective: Vital signs in last 24 hours: Temp:  [97.9 F (36.6 C)-98.3 F (36.8 C)] 97.9 F (36.6 C) (08/03 0334) Pulse Rate:  [76-86] 86 (08/03 0334) Resp:  [16-20] 20 (08/03 0334) BP: (95-111)/(45-60) 106/45 mmHg (08/03 0334) SpO2:  [95 %-98 %] 98 % (08/03 0334) Weight:  [135.172 kg (298 lb)] 135.172 kg (298 lb) (08/03 0334) Last BM Date: 04/09/15  Intake/Output from previous day:   Intake/Output this shift:    PE: Gen:  Alert, NAD, pleasant Card:  RRR, no M/G/R heard Pulm:  CTA, no W/R/R Abd: Obese, soft, mild distension, non-tender to palpation, +BS, no HSM, incisions C/D/I with dermabond in place, minimal induration around umbilicus (CT shows small seroma which is smaller)   Lab Results:   Recent Labs  05/10/15 2153 05/11/15 0521  WBC 11.1* 9.5  HGB 11.0* 9.9*  HCT 34.2* 30.5*  PLT 421* 330   BMET  Recent Labs  05/10/15 2153 05/11/15 0521  NA 138 139  K 3.6 3.7  CL 94* 96*  CO2 27 27  GLUCOSE 138* 106*  BUN 42* 42*  CREATININE 10.41* 11.09*  CALCIUM 9.8 9.6   PT/INR  Recent Labs  05/11/15 0521  LABPROT 15.0  INR 1.16   CMP     Component Value Date/Time   NA 139  05/11/2015 0521   K 3.7 05/11/2015 0521   CL 96* 05/11/2015 0521   CO2 27 05/11/2015 0521   GLUCOSE 106* 05/11/2015 0521   BUN 42* 05/11/2015 0521   CREATININE 11.09* 05/11/2015 0521   CALCIUM 9.6 05/11/2015 0521   CALCIUM 8.5 04/07/2014 1057   PROT 8.4* 05/10/2015 2153   ALBUMIN 4.3 05/10/2015 2153   AST 32 05/10/2015 2153   ALT 18 05/10/2015 2153   ALKPHOS 85 05/10/2015 2153   BILITOT 0.6 05/10/2015 2153   GFRNONAA 5* 05/11/2015 0521   GFRAA 5* 05/11/2015 0521   Lipase     Component Value Date/Time   LIPASE 77* 05/10/2015 2153       Studies/Results: Ct Abdomen Pelvis Wo Contrast  05/11/2015   CLINICAL DATA:  Diffuse abdominal pain.  Nausea and vomiting.  EXAM: CT ABDOMEN AND PELVIS WITHOUT CONTRAST  TECHNIQUE: Multidetector CT imaging of the abdomen and pelvis was performed following the standard protocol without IV contrast.  COMPARISON:  05/04/2015  FINDINGS: There is persistent small bowel obstruction with marked dilatation of proximal and mid small bowel. There is transition to decompressed distal small bowel at the level of the umbilicus. There is a small complex collection of soft tissue and air which may be postoperative  in nature, as there was a 3 x 6 cm seroma in this location on 05/04/2015. However, there also is a 2 cm soft tissue nodule in the mesentery adjacent the small bowel on axial image 60, series 2 and coronal image 37, series 5. This is new or more prominent than 05/04/2015 and may represent focal inflammatory change. There is no interval change in the 1.9 cm soft tissue nodule in the left nephrectomy bed.  There are unremarkable unenhanced appearances of the liver, spleen, pancreas, adrenals and right kidney. Abdominal aorta is normal caliber with mild atherosclerotic calcification. Mild atelectatic appearing dependent posterior base opacities are present in both lungs small pleural fluid collections are present bilaterally. There is no significant musculoskeletal  lesion.  IMPRESSION: 1. Persistent small bowel obstruction, transition point in the mid abdomen at the level of the umbilicus. 2. The presumed seroma at the deep aspect of the abdominal wall musculature at the umbilicus is smaller and now is comprised of a small volume of air and soft tissue. No significant acute inflammatory changes are evident in association with this finding in the herniorrhaphy bed. 3. New nodular soft tissue thickening in the mesentery, possibly inflammatory, but indeterminate. 4. Unchanged 1.9 cm soft tissue nodule in the left nephrectomy bed, indeterminate   Electronically Signed   By: Andreas Newport M.D.   On: 05/11/2015 00:45   Dg Abd Acute W/chest  05/10/2015   CLINICAL DATA:  Acute onset of vomiting and constipation. Recent small bowel obstruction, status post laparoscopy. Initial encounter.  EXAM: DG ABDOMEN ACUTE W/ 1V CHEST  COMPARISON:  CT of the abdomen and pelvis from 05/04/2015  FINDINGS: The lungs are well-aerated. Mild vascular congestion is noted. Small bilateral pleural effusions are seen, left greater than right. No pneumothorax is appreciated. The cardiomediastinal silhouette remains normal in size. A right-sided dual-lumen catheter is noted ending about the cavoatrial junction.  There is dilatation of small bowel loops up to 6.4 cm in maximal diameter, concerning for recurrent small bowel obstruction. Alternatively, ileus might have a similar appearance. Contrast is noted within the colon, extending to the rectum. No free intra-abdominal air is identified on the provided upright view.  No acute osseous abnormalities are seen; the sacroiliac joints are unremarkable in appearance.  IMPRESSION: 1. Dilatation of small bowel loops up to 6.4 cm in maximal diameter, concerning for recurrent small bowel obstruction. Alternately, ileus might have a similar appearance. Contrast noted within the colon, extending to the rectum. No free intra-abdominal air seen. 2. Mild vascular  congestion noted. Small bilateral pleural effusions, left greater than right. Lungs otherwise clear.   Electronically Signed   By: Garald Balding M.D.   On: 05/10/2015 23:46    Anti-infectives: Anti-infectives    None       Assessment/Plan SBO:  POD #7 - Diagnostic laparoscopy, lysis of adhesions Ninfa Linden 05/05/15), POD 123XX123 s/p umbilical hernia repair and PD cath removal Re-admitted 05/11/15 for recurrent SBO -Last surgery Dr. Ninfa Linden did not see actual obstruction associated with the mesh.  -Wonder if this is more ileus vs dysmotility.  He's been passing flatus since discharge and at home.  He has already had a large BM this am.  No pain or N/V.   -Ambulate and IS -Start clears and can likely advance diet as tolerated.  May need to have more frequent smaller meals and soft foods to make it easier to digest if there is some component of dysmotility.  Does not have NG. -Try reglan before meals. -  Dr. Ninfa Linden to give further recommendations  ESRD. Hemodialysis, pending revision of AV fistula since its not maturing as vascular would like - on 05/26/15 Hypertension DM. Management per medicine  OSA. - CPAP     LOS: 0 days    Nat Christen 05/11/2015, 10:08 AM Pager: [phone]

## 2015-05-11 NOTE — Progress Notes (Signed)
  Pt admitted to the unit. Pt is stable, alert and oriented per baseline. Oriented to room, staff, and call bell. Educated to call for any assistance. Bed in lowest position, call bell within reach- will continue to monitor. 

## 2015-05-11 NOTE — ED Notes (Signed)
Patient transported to CT 

## 2015-05-11 NOTE — Progress Notes (Signed)
Initial Nutrition Assessment  DOCUMENTATION CODES:   Morbid obesity  INTERVENTION:   Boost Breeze po BID, each supplement provides 250 kcal and 9 grams of protein  NUTRITION DIAGNOSIS:   Inadequate oral intake related to altered GI function as evidenced by NPO status (just advanced to clear liquids).  GOAL:   Patient will meet greater than or equal to 90% of their needs  MONITOR:   PO intake, Supplement acceptance, Diet advancement, Labs, Weight trends, Skin, I & O's  REASON FOR ASSESSMENT:   Malnutrition Screening Tool    ASSESSMENT:   Robert [name] is a 54 y.o. male with PMH of left nephrectomy, recent umbilical hernia repair, s/p of surgery of small bowel obstruction, hypertension, diabetes mellitus, hypothyroidism, OSA, ESRD-HD (MWF), SVT, who presents with nausea and vomiting.  Pt admitted with SBO.   Pt reports decreased oral intake over the past 2 weeks, due to nausea, vomiting, and recent surgery (POD 123XX123 umbilical hernia repair). He reports he was eating solid foods on Saturday and Sunday, but intake has been minimal since then.   Pt reports UBW of 311#. Noted 8# (2.65) wt loss x 5 days. Wt loss is difficult to assess due to pt being on HD.   Pt reports concern over minimal nutritional intake. He would like to eat and was just placed on a clear liquid diet. He consumed a Sprite prior to RD visit without difficulty. He is requesting a Nepro shake; RD educated pt on clear liquid diet principles. Offered [place], which he would like to try.   Nutrition-Focused physical exam completed. Findings are no fat depletion, no muscle depletion, and no edema.   Discussed importance of good PO intake to support healing. Pt expressed appreciation for visit.   Diet Order:  Diet clear liquid Room service appropriate?: Yes; Fluid consistency:: Thin  Skin:  Reviewed, no issues  Last BM:  05/10/15  Height:   Ht Readings from Last 1 Encounters:  05/11/15 6'  (1.829 m)    Weight:   Wt Readings from Last 1 Encounters:  05/11/15 294 lb 8.6 oz (133.6 kg)    Ideal Body Weight:  80.9 kg  BMI:  Body mass index is 39.94 kg/(m^2).  Estimated Nutritional Needs:   Kcal:  2100-2300  Protein:  100-115 grams  Fluid:  per MD  EDUCATION NEEDS:   Education needs addressed  Latera Mclin A. Jimmye Norman, RD, LDN, CDE Pager: [phone] After hours Pager: [phone]

## 2015-05-11 NOTE — Procedures (Signed)
Patient was seen on dialysis and the procedure was supervised. BFR 350 [address] RIJ TDC BP is 88/54.  Patient appears to be tolerating treatment well.

## 2015-05-11 NOTE — Progress Notes (Signed)
Patient ID: Robert [name], male   DOB: Oct 01, 1961, 54 y.o.   MRN: [id number]  [facility] Progress Note    Subjective:   Feels better and had a bowel movement today   Objective:   BP 106/45 mmHg  Pulse 86  Temp(Src) 97.9 F (36.6 C) (Oral)  Resp 20  Ht 6' (1.829 m)  Wt 135.172 kg (298 lb)  BMI 40.41 kg/m2  SpO2 98%  Intake/Output:     Intake/Output this shift:    Weight change:   Physical Exam: Gen:WD WN WM in NAD CVS:no rub Resp:cta Abd:+BS, soft, mildly tender, no guarding/rebound Ext:no edema, R cimino AVF +T/B, poorly matured  Labs: BMET  Recent Labs Lab 05/05/15 0411 05/05/15 0945 05/06/15 0344 05/07/15 0322 05/08/15 0418 05/10/15 2153 05/11/15 0521  NA 141 140 138 137 136 138 139  K 4.7 4.9 4.9 4.1 4.0 3.6 3.7  CL 111 96* 95* 96* 94* 94* 96*  CO2 20* 28 25 28 25 27 27   GLUCOSE 111* 108* 87 114* 108* 138* 106*  BUN 12 39* 25* 25* 44* 42* 42*  CREATININE 0.85 11.41* 9.33* 8.32* 11.12* 10.41* 11.09*  ALBUMIN  --  3.4*  --   --   --  4.3  --   CALCIUM 9.2 8.5* 8.7* 9.5 9.6 9.8 9.6  PHOS  --  8.0*  --   --   --   --   --    CBC  Recent Labs Lab 05/06/15 0344 05/07/15 0322 05/10/15 2153 05/11/15 0521  WBC 8.7 8.1 11.1* 9.5  HGB 9.8* 9.7* 11.0* 9.9*  HCT 31.6* 30.4* 34.2* 30.5*  MCV 102.9* 98.7 99.4 99.0  PLT 232 251 421* 330    @IMGRELPRIORS @ Medications:    . aspirin  81 mg Oral q morning - 10a  . calcitRIOL  0.25 mcg Oral Daily  . calcium acetate  667 mg Oral TID WC  . insulin aspart  0-9 Units Subcutaneous TID WC  . insulin glargine  10 Units Subcutaneous Daily  . lanthanum  2,000 mg Oral TID WC  . levothyroxine  100 mcg Oral QAC breakfast  . loratadine  10 mg Oral Daily  . nebivolol  15 mg Oral Daily  . ondansetron      . sodium chloride  3 mL Intravenous Q12H   Dialysis Orders: Center: [facility] on MWF . EDW 131kg HD [place] 2.0 K 2.0 Ca 1 Mg Time 4 hours 30 minutes Heparin 14000 units per treatment. Access R perm  cath Optiflux 200NRe BFR 450 DFR 800  Venofer 100 mcg IV  Aransep 200 mcg IV weekly  Calcitriol 1.5 mcg PO MWF   Assessment/ Plan:   1. Partial SBO- is passing gas and had a bowel movement today.  Awaiting General surgery evaluation 2. ESRD; continue with MWF HD and plan to change his EDW (was 140.5 and weighed 132.4kg today so will set new EDW at 131) 3. Anemia of chronic disease- continue with Aranesp and give dose today.   4. CKD-MBD: cont with binders if ok with Gen surgery 5. Nutrition:per primary svc 6. Hypertension:stable 7. Vascular access- poorly maturing AVF to have revision by Dr. Trula Slade.  Donato Heinz A 05/11/2015, 9:38 AM

## 2015-05-11 NOTE — H&P (Signed)
[facility] History and Physical  Robert [name] [id number] DOB: Aug 13, 1961 DOA: 05/10/2015  Referring physician: ED physician PCP: Haywood Pao, MD  Specialists:   Chief Complaint: Nausea, vomiting  HPI: Robert [name] is a 54 y.o. male with PMH of left nephrectomy, recent umbilical hernia repair, s/p of surgery of small bowel obstruction, hypertension, diabetes mellitus, hypothyroidism, OSA, ESRD-HD (MWF), SVT, who presents with nausea and vomiting.  Patient reports that he had umbilical hernia repair on 04/28/15, and developed small bowel obstruction on 7/27, and had surgery for SBO on 05/04/15. In the past 2 days, he has been having nausea and vomiting. He vomited approximately 2 or 3 times per day.  Last bowel movement was yesterday. He states that he has a lot of gas. He does not have abdominal pain. No fever, chills, chest pain, shortness breath, cough, symptoms for UTI. No unilateral weakness.  In ED, patient was found to have lipase is 77, WBC 11.1, temperature normal, no tachycardia, creatinine 10.41, BUN 42. CT abdomen showed small bowel obstruction with transition point at mid abdomen. Patient is admitted to inpatient for further evaluation and treatment. General surgeon was consulted by ED (I am not sure which surgeon, since Patient is accepted by another on-call hospitialist).   Where does patient live?   At home    Can patient participate in ADLs?  Yes      Review of Systems:   General: no fevers, chills, no changes in body weight,  has fatigue HEENT: no blurry vision, hearing changes or sore throat Pulm: no dyspnea, coughing, wheezing CV: no chest pain, palpitations Abd: has nausea, vomiting, no abdominal pain, diarrhea, constipation GU: no dysuria, burning on urination, increased urinary frequency, hematuria  Ext: no leg edema Neuro: no unilateral weakness, numbness, or tingling, no vision change or hearing loss Skin: no rash MSK: No muscle spasm, no  deformity, no limitation of range of movement in spin Heme: No easy bruising.  Travel history: No recent long distant travel.  Allergy: No Known Allergies  Past Medical History  Diagnosis Date  . Diabetes mellitus without complication   . Thyroid disease   . Hyperlipidemia   . PAT (paroxysmal atrial tachycardia)   . PSVT (paroxysmal supraventricular tachycardia)   . Blood transfusion without reported diagnosis 1974    kidney removed - L  . CPAP (continuous positive airway pressure) dependence   . Dysrhythmia   . Shortness of breath   . Hypothyroidism   . GERD (gastroesophageal reflux disease)     n/v a lot  . Headache(784.0)     slight dull h/a due kidney failure  . Anemia     low hgb at present  . Umbilical hernia     will repair with thi surgery  . Neuropathy     right hand and both feet  . Constipation   . Macular degeneration   . Eczema   . Sleep apnea 06/18/11    split-night sleep study- [facility] BiPAP- no oxygen  . Chronic kidney disease     Stage IV, Richarda Blade. M,W,F  . Cancer     skin ca right shoulder, plastic dsyplasia, pre-Ca polpys removed on Colonoscopy- 07/2014  . Hypertension     SVT h/o , followed by Dr. Claiborne Billings    Past Surgical History  Procedure Laterality Date  . Nephrectomy Left 1974  . Renal biopsy Right 2012  . Skin cancer excision      right shoulder  . Capd  insertion N/A 05/18/2014    Procedure: LAPAROSCOPIC INSERTION CONTINUOUS AMBULATORY PERITONEAL DIALYSIS  (CAPD) CATHETER;  Surgeon: Ralene Ok, MD;  Location: [place];  Service: General;  Laterality: N/A;  . Umbilical hernia repair N/A 05/18/2014    Procedure: HERNIA REPAIR UMBILICAL ADULT;  Surgeon: Ralene Ok, MD;  Location: [place];  Service: General;  Laterality: N/A;  . Laparoscopic repositioning capd catheter N/A 06/16/2014    Procedure: LAPAROSCOPIC REPOSITIONING CAPD CATHETER;  Surgeon: Ralene Ok, MD;  Location: [place];  Service: General;   Laterality: N/A;  . Ij catheter insertion    . Colonoscopy    . Insertion of dialysis catheter Right 02/25/2015    Procedure: INSERTION OF RIGHT INTERNAL JUGULAR DIALYSIS CATHETER;  Surgeon: Serafina Mitchell, MD;  Location: [place];  Service: Vascular;  Laterality: Right;  . Av fistula placement Right 02/25/2015    Procedure: RIGHT ARTERIOVENOUS (AV) FISTULA CREATION;  Surgeon: Serafina Mitchell, MD;  Location: [place] [facility];  Service: Vascular;  Laterality: Right;  . Umbilical hernia repair N/A 04/28/2015    Procedure: UMBILICAL HERNIA REPAIR WITH MESH;  Surgeon: Coralie Keens, MD;  Location: [place];  Service: General;  Laterality: N/A;  . Minor removal of peritoneal dialysis catheter N/A 04/28/2015    Procedure:  REMOVAL OF PERITONEAL DIALYSIS CATHETER;  Surgeon: Coralie Keens, MD;  Location: [place];  Service: General;  Laterality: N/A;  . Laparoscopy N/A 05/04/2015    Procedure: LAPAROSCOPY DIAGNOSTIC LYSIS OF ADHESIONS;  Surgeon: Coralie Keens, MD;  Location: [place];  Service: General;  Laterality: N/A;    Social History:  reports that he quit smoking about 17 months ago. His smoking use included Pipe. He has never used smokeless tobacco. He reports that he drinks about 0.6 oz of alcohol per week. He reports that he does not use illicit drugs.  Family History:  Family History  Problem Relation Age of Onset  . Colon polyps Father   . Colon cancer Neg Hx   . Stomach cancer Neg Hx      Prior to Admission medications   Medication Sig Start Date End Date Taking? Authorizing Provider  aspirin 81 MG tablet Take 81 mg by mouth every morning.     Historical Provider, MD  BYSTOLIC 5 MG tablet Take 15 mg by mouth daily.  04/04/15   Historical Provider, MD  CALCITRIOL PO Take 3 tablets by mouth 3 (three) times a week. Monday, Wednesday and Friday.    Historical Provider, MD  calcium acetate (PHOSLO) 667 MG capsule Take 667 mg by mouth 3 (three) times daily with meals.    Historical Provider, MD  cetirizine  (ZYRTEC) 10 MG tablet Take 10 mg by mouth every morning.     Historical Provider, MD  docusate sodium (COLACE) 100 MG capsule Take 100 mg by mouth every evening.     Historical Provider, MD  fluocinonide cream (LIDEX) [id number] % Apply 1 application topically daily as needed (Skin inflammation (hand, thigh, back)).     Historical Provider, MD  FOSRENOL 1000 MG chewable tablet Chew 1,000-2,000 mg by mouth 4 (four) times daily. 2000 mg with every meal and 1000 mg with snack 12/29/14   Historical Provider, MD  glucose blood (ONETOUCH VERIO) test strip Use as instructed to check blood sugar 6 a day dx code E11.29 08/04/14   Elayne Snare, MD  Insulin Glargine (LANTUS SOLOSTAR) 100 UNIT/ML Solostar Pen Inject 15 Units into the skin every morning. 05/08/15   Domenic Polite, MD  insulin regular human  CONCENTRATED (HUMULIN R) 500 UNIT/ML injection Inject 6 units in the am and 8 units at supper, can slowly adjust back to prior regimen when Oral intake more reliable and Blood sugars tolerate 05/08/15   Domenic Polite, MD  Insulin Syringe-Needle U-100 (INSULIN SYRINGE 1CC/31GX5/16") 31G X 5/16" 1 ML MISC Use to inject insulin 08/12/14   Elayne Snare, MD  levothyroxine (SYNTHROID, LEVOTHROID) 100 MCG tablet TAKE 1 TABLET (100 MCG TOTAL) BY MOUTH DAILY. 10/04/14   Elayne Snare, MD  NOVOFINE 32G X 6 MM MISC USE 4 PEN NEEDLES PER DAY TO INJECT INSULIN 08/27/14   Elayne Snare, MD  OVER THE COUNTER MEDICATION Take 1 tablet by mouth daily. OTC DiaVite    Historical Provider, MD  oxyCODONE-acetaminophen (ROXICET) 5-325 MG per tablet Take 1-2 tablets by mouth every 4 (four) hours as needed for severe pain. 04/28/15   Coralie Keens, MD  polyethylene glycol [facility] / Floria Raveling) packet Take 17 g by mouth daily.    Historical Provider, MD    Physical Exam: Filed Vitals:   05/10/15 2134 05/10/15 2205 05/11/15 0100 05/11/15 0334  BP: 95/56 96/59 111/60 106/45  Pulse: 81 76 76 86  Temp: 98.3 F (36.8 C)   97.9 F (36.6 C)  TempSrc:  Oral   Oral  Resp: 16 18 18 20   Height: 6' (1.829 m)   6' (1.829 m)  Weight: 135.172 kg (298 lb)   135.172 kg (298 lb)  SpO2: 95% 98% 96% 98%   General: Not in acute distress HEENT:       Eyes: PERRL, EOMI, no scleral icterus.       ENT: No discharge from the ears and nose, no pharynx injection, no tonsillar enlargement.        Neck: No JVD, no bruit, no mass felt. Heme: No neck lymph node enlargement. Cardiac: S1/S2, RRR, No murmurs, No gallops or rubs. Pulm: No rales, wheezing, rhonchi or rubs. Has HD cath with clean surroundings. Abd: Soft, nondistended, nontender, no rebound pain, no organomegaly, BS present. Has 7 well healing surgical scars in abdomen wall. Ext: No pitting leg edema bilaterally. 2+DP/PT pulse bilaterally. Has AVF in right arm with good bruit. Musculoskeletal: No joint deformities, No joint redness or warmth, no limitation of ROM in spin. Skin: No rashes.  Neuro: Alert, oriented X3, cranial nerves II-XII grossly intact, muscle strength 5/5 in all extremities, sensation to light touch intact. B Psych: Patient is not psychotic, no suicidal or hemocidal ideation.  Labs on Admission:  Basic Metabolic Panel:  Recent Labs Lab 05/05/15 0945 05/06/15 0344 05/07/15 0322 05/08/15 0418 05/10/15 2153  NA 140 138 137 136 138  K 4.9 4.9 4.1 4.0 3.6  CL 96* 95* 96* 94* 94*  CO2 28 25 28 25 27   GLUCOSE 108* 87 114* 108* 138*  BUN 39* 25* 25* 44* 42*  CREATININE 11.41* 9.33* 8.32* 11.12* 10.41*  CALCIUM 8.5* 8.7* 9.5 9.6 9.8  PHOS 8.0*  --   --   --   --    Liver Function Tests:  Recent Labs Lab 05/05/15 0945 05/10/15 2153  AST  --  32  ALT  --  18  ALKPHOS  --  85  BILITOT  --  0.6  PROT  --  8.4*  ALBUMIN 3.4* 4.3    Recent Labs Lab 05/10/15 2153  LIPASE 77*   No results for input(s): AMMONIA in the last 168 hours. CBC:  Recent Labs Lab 05/05/15 0945 05/06/15 0344 05/07/15 0322 05/10/15 2153  WBC 8.5 8.7 8.1 11.1*  HGB 9.5* 9.8* 9.7* 11.0*   HCT 29.7* 31.6* 30.4* 34.2*  MCV 102.1* 102.9* 98.7 99.4  PLT 209 232 251 421*   Cardiac Enzymes: No results for input(s): CKTOTAL, CKMB, CKMBINDEX, TROPONINI in the last 168 hours.  BNP (last 3 results) No results for input(s): BNP in the last 8760 hours.  ProBNP (last 3 results) No results for input(s): PROBNP in the last 8760 hours.  CBG:  Recent Labs Lab 05/07/15 1225 05/07/15 1641 05/07/15 2128 05/08/15 0751 05/08/15 1142  GLUCAP 111* 142* 113* 112* 166*    Radiological Exams on Admission: Ct Abdomen Pelvis Wo Contrast  05/11/2015   CLINICAL DATA:  Diffuse abdominal pain.  Nausea and vomiting.  EXAM: CT ABDOMEN AND PELVIS WITHOUT CONTRAST  TECHNIQUE: Multidetector CT imaging of the abdomen and pelvis was performed following the standard protocol without IV contrast.  COMPARISON:  05/04/2015  FINDINGS: There is persistent small bowel obstruction with marked dilatation of proximal and mid small bowel. There is transition to decompressed distal small bowel at the level of the umbilicus. There is a small complex collection of soft tissue and air which may be postoperative in nature, as there was a 3 x 6 cm seroma in this location on 05/04/2015. However, there also is a 2 cm soft tissue nodule in the mesentery adjacent the small bowel on axial image 60, series 2 and coronal image 37, series 5. This is new or more prominent than 05/04/2015 and may represent focal inflammatory change. There is no interval change in the 1.9 cm soft tissue nodule in the left nephrectomy bed.  There are unremarkable unenhanced appearances of the liver, spleen, pancreas, adrenals and right kidney. Abdominal aorta is normal caliber with mild atherosclerotic calcification. Mild atelectatic appearing dependent posterior base opacities are present in both lungs small pleural fluid collections are present bilaterally. There is no significant musculoskeletal lesion.  IMPRESSION: 1. Persistent small bowel obstruction,  transition point in the mid abdomen at the level of the umbilicus. 2. The presumed seroma at the deep aspect of the abdominal wall musculature at the umbilicus is smaller and now is comprised of a small volume of air and soft tissue. No significant acute inflammatory changes are evident in association with this finding in the herniorrhaphy bed. 3. New nodular soft tissue thickening in the mesentery, possibly inflammatory, but indeterminate. 4. Unchanged 1.9 cm soft tissue nodule in the left nephrectomy bed, indeterminate   Electronically Signed   By: Andreas Newport M.D.   On: 05/11/2015 00:45   Dg Abd Acute W/chest  05/10/2015   CLINICAL DATA:  Acute onset of vomiting and constipation. Recent small bowel obstruction, status post laparoscopy. Initial encounter.  EXAM: DG ABDOMEN ACUTE W/ 1V CHEST  COMPARISON:  CT of the abdomen and pelvis from 05/04/2015  FINDINGS: The lungs are well-aerated. Mild vascular congestion is noted. Small bilateral pleural effusions are seen, left greater than right. No pneumothorax is appreciated. The cardiomediastinal silhouette remains normal in size. A right-sided dual-lumen catheter is noted ending about the cavoatrial junction.  There is dilatation of small bowel loops up to 6.4 cm in maximal diameter, concerning for recurrent small bowel obstruction. Alternatively, ileus might have a similar appearance. Contrast is noted within the colon, extending to the rectum. No free intra-abdominal air is identified on the provided upright view.  No acute osseous abnormalities are seen; the sacroiliac joints are unremarkable in appearance.  IMPRESSION: 1. Dilatation of small bowel loops up to  6.4 cm in maximal diameter, concerning for recurrent small bowel obstruction. Alternately, ileus might have a similar appearance. Contrast noted within the colon, extending to the rectum. No free intra-abdominal air seen. 2. Mild vascular congestion noted. Small bilateral pleural effusions, left  greater than right. Lungs otherwise clear.   Electronically Signed   By: Garald Balding M.D.   On: 05/10/2015 23:46    EKG:  Not done in ED, will get one.   Assessment/Plan Principal Problem:   SBO (small bowel obstruction) Active Problems:   H/O unilateral nephrectomy   Diabetes mellitus type 2 in obese   SVT (supraventricular tachycardia)   Severe obstructive sleep apnea   Hypothyroidism   End stage renal disease on dialysis   HTN (hypertension)  Recurrent SBO: most likely due to adhesion. Patient does not have abdominal pain currently, but has a transition point by CT abdomen/pelvis. Currently patient is not septic. Hemodynamically stable. General surgery was consulted by ED.  -admit to Tele give hx of SVT -NPO  -hold NG tube since patient's symptoms are mild now. -Zofran when necessary for nausea and morphine IV 2 mg prn pain  -IVF: 50 cc/h  -Follow-up surgeons recommendations  DM-II: Last A1c 5.9 on 05/02/15, well controled. Patient is taking Lantus at home -will decrease Lantus dose from  15 to 10 units daily while on NPO -SSI  Severe obstructive sleep apnea: -CPAP  Hypothyroidism: Last TSH was 5.83 on 05/02/15. -Continue home Synthroid  End stage renal disease on dialysis (MWF): Creatinine 3.41, BUN 42, potassium normal. -left message to renal for HD -continue Fosrenal  Leukocytosis: No signs of infection. Likely due to stress induced de- margination. -follow up by CBC  HTN: -continue bystolic  DVT ppx: SCD  Code Status: Full code Family Communication: None at bed side.  Disposition Plan: Admit to inpatient   Date of Service 05/11/2015    Ivor Costa [facility] Pager [phone]  If 7PM-7AM, please contact night-coverage [email or web address] Password [facility] 05/11/2015, 5:44 AM

## 2015-05-11 NOTE — Care Management Note (Signed)
Case Management Note  Patient Details  Name: Robert [name] MRN: [id number] Date of Birth: [birth year]  Subjective/Objective:                 Patient from home with wife. Has had several admissions including recent umbilical hernia repair. Patient admitted with SBO confirmed by CT.   Action/Plan:  Will continue to follow.  Expected Discharge Date:  05/16/15               Expected Discharge Plan:  Home/Self Care  In-House Referral:     Discharge planning Services  CM Consult  Post Acute Care Choice:    Choice offered to:     DME Arranged:    DME Agency:     HH Arranged:    HH Agency:     Status of Service:  In process, will continue to follow  Medicare Important Message Given:    Date Medicare IM Given:    Medicare IM give by:    Date Additional Medicare IM Given:    Additional Medicare Important Message give by:     If discussed at [place] of Stay Meetings, dates discussed:    Additional Comments:  Carles Collet, RN 05/11/2015, 2:24 PM

## 2015-05-11 NOTE — ED Provider Notes (Signed)
Patient visit shared.  Pt here with N/V, CT scan with evidence of SBO.  Discussed with Dr. Georgette Dover with General Surgery - will have someone with Surgery see him in consult, recommends NPO status, Hospitalist admission.  Discussed with Dr. [name] Pronto, who agrees to accept the patient in transfer.  Patient updated as to findings of studies and need for admission.    Quintella Reichert, MD 05/11/15 [phone]

## 2015-05-11 NOTE — Progress Notes (Signed)
Patient has home CPAP at bedside when ready to wear. Patient expressed he did not need any assistance. RT will continue to monitor.

## 2015-05-11 NOTE — Progress Notes (Signed)
Patient admitted earlier today. H&P reviewed. Patient seen and examined.  S; Patient feels better this morning. Had a large bowel movement. Denies any nausea. No abdominal pain currently.  O: Vital signs reviewed  Lungs are clear to auscultation bilaterally. No wheezing, rales or rhonchi. S1, S2 is normal, regular. No S3, S4. No rubs, murmurs, or bruit. Abdomen is soft. Nontender. Nondistended. Scars from previous surgery is noted. Bowel sounds sluggish. He is alert and oriented 3. No focal neurological deficits are noted No pedal edema  A/P: 54 year old Caucasian male admitted with recurrent small bowel obstruction. CT findings noted. Patient had a large bowel movement this morning. This could suggest improvement. General surgery consult is pending. He underwent surgery just last week.  He has end-stage renal disease. Nephrology is following. He'll be dialyzed today.  Diabetes mellitus type 2: Continue Lantus  Sleep apnea: Continue CPAP  History of hypothyroidism: Continue levothyroxine.  We will continue to follow.  Robert [name] 3:00 PM

## 2015-05-11 NOTE — Progress Notes (Signed)
PT Cancellation Note  Patient Details Name: Robert [name] MRN: [id number] DOB: 10-11-1960   Cancelled Treatment:    Reason Eval/Treat Not Completed: Patient at procedure or test/unavailable.  Just left for HD and will retry tomorrow.   Ramond Dial 05/11/2015, 1:13 PM   Mee Hives, PT MS Acute Rehab Dept. Number: [facility] [phone] and [place] [phone]

## 2015-05-12 DIAGNOSIS — R1110 Vomiting, unspecified: Secondary | ICD-10-CM

## 2015-05-12 DIAGNOSIS — R112 Nausea with vomiting, unspecified: Secondary | ICD-10-CM | POA: Insufficient documentation

## 2015-05-12 LAB — GLUCOSE, CAPILLARY
GLUCOSE-CAPILLARY: 178 mg/dL — AB (ref 65–99)
GLUCOSE-CAPILLARY: 141 mg/dL — AB (ref 65–99)
Glucose-Capillary: 105 mg/dL — ABNORMAL HIGH (ref 65–99)
Glucose-Capillary: 144 mg/dL — ABNORMAL HIGH (ref 65–99)

## 2015-05-12 MED ORDER — DOCUSATE SODIUM 100 MG PO CAPS
100.0000 mg | ORAL_CAPSULE | Freq: Two times a day (BID) | ORAL | Status: DC
Start: 2015-05-12 — End: 2015-05-14
  Administered 2015-05-12 – 2015-05-13 (×2): 100 mg via ORAL
  Filled 2015-05-12 (×2): qty 1

## 2015-05-12 MED ORDER — POLYETHYLENE GLYCOL 3350 17 G PO PACK
17.0000 g | PACK | Freq: Every day | ORAL | Status: DC
  Administered 2015-05-12 – 2015-05-13 (×2): 17 g via ORAL
  Filled 2015-05-12 (×2): qty 1

## 2015-05-12 MED ORDER — BISACODYL 10 MG RE SUPP: 10.0000 mg | Freq: Every day | RECTAL | Status: DC | PRN

## 2015-05-12 NOTE — Progress Notes (Signed)
[facility] Progress Note     Subjective: Pt feels okay.  No vomiting, but does have nausea.  No abdominal pain, mild bloating.  Ambulating in halls.  Says he only did one lap yesterday because of too many visitors.  I told him to do 5 laps today and walk and talk.  Had BM this am again.  Passing lots of flatus.  Objective: Vital signs in last 24 hours: Temp:  [97 F (36.1 C)-98.5 F (36.9 C)] 97.5 F (36.4 C) (08/04 0532) Pulse Rate:  [65-80] 65 (08/04 0532) Resp:  [13-20] 18 (08/04 0532) BP: (86-121)/(47-74) 115/74 mmHg (08/04 0532) SpO2:  [90 %-99 %] 95 % (08/04 0532) Weight:  [131.4 kg (289 lb 11 oz)-133.6 kg (294 lb 8.6 oz)] 131.4 kg (289 lb 11 oz) (08/03 1645) Last BM Date: 05/12/15  Intake/Output from previous day: 08/03 0701 - 08/04 0700 In: 0  Out: 2200  Intake/Output this shift:    PE: Gen:  Alert, NAD, pleasant Abd: Soft, mild distension, NT, +BS, no HSM   Lab Results:   Recent Labs  05/10/15 2153 05/11/15 0521  WBC 11.1* 9.5  HGB 11.0* 9.9*  HCT 34.2* 30.5*  PLT 421* 330   BMET  Recent Labs  05/10/15 2153 05/11/15 0521  NA 138 139  K 3.6 3.7  CL 94* 96*  CO2 27 27  GLUCOSE 138* 106*  BUN 42* 42*  CREATININE 10.41* 11.09*  CALCIUM 9.8 9.6   PT/INR  Recent Labs  05/11/15 0521  LABPROT 15.0  INR 1.16   CMP     Component Value Date/Time   NA 139 05/11/2015 0521   K 3.7 05/11/2015 0521   CL 96* 05/11/2015 0521   CO2 27 05/11/2015 0521   GLUCOSE 106* 05/11/2015 0521   BUN 42* 05/11/2015 0521   CREATININE 11.09* 05/11/2015 0521   CALCIUM 9.6 05/11/2015 0521   CALCIUM 8.5 04/07/2014 1057   PROT 8.4* 05/10/2015 2153   ALBUMIN 4.3 05/10/2015 2153   AST 32 05/10/2015 2153   ALT 18 05/10/2015 2153   ALKPHOS 85 05/10/2015 2153   BILITOT 0.6 05/10/2015 2153   GFRNONAA 5* 05/11/2015 0521   GFRAA 5* 05/11/2015 0521   Lipase     Component Value Date/Time   LIPASE 77* 05/10/2015 2153       Studies/Results: Ct  Abdomen Pelvis Wo Contrast  05/11/2015   CLINICAL DATA:  Diffuse abdominal pain.  Nausea and vomiting.  EXAM: CT ABDOMEN AND PELVIS WITHOUT CONTRAST  TECHNIQUE: Multidetector CT imaging of the abdomen and pelvis was performed following the standard protocol without IV contrast.  COMPARISON:  05/04/2015  FINDINGS: There is persistent small bowel obstruction with marked dilatation of proximal and mid small bowel. There is transition to decompressed distal small bowel at the level of the umbilicus. There is a small complex collection of soft tissue and air which may be postoperative in nature, as there was a 3 x 6 cm seroma in this location on 05/04/2015. However, there also is a 2 cm soft tissue nodule in the mesentery adjacent the small bowel on axial image 60, series 2 and coronal image 37, series 5. This is new or more prominent than 05/04/2015 and may represent focal inflammatory change. There is no interval change in the 1.9 cm soft tissue nodule in the left nephrectomy bed.  There are unremarkable unenhanced appearances of the liver, spleen, pancreas, adrenals and right kidney. Abdominal aorta is normal caliber with mild atherosclerotic calcification. Mild atelectatic  appearing dependent posterior base opacities are present in both lungs small pleural fluid collections are present bilaterally. There is no significant musculoskeletal lesion.  IMPRESSION: 1. Persistent small bowel obstruction, transition point in the mid abdomen at the level of the umbilicus. 2. The presumed seroma at the deep aspect of the abdominal wall musculature at the umbilicus is smaller and now is comprised of a small volume of air and soft tissue. No significant acute inflammatory changes are evident in association with this finding in the herniorrhaphy bed. 3. New nodular soft tissue thickening in the mesentery, possibly inflammatory, but indeterminate. 4. Unchanged 1.9 cm soft tissue nodule in the left nephrectomy bed, indeterminate    Electronically Signed   By: Andreas Newport M.D.   On: 05/11/2015 00:45   Dg Abd Acute W/chest  05/10/2015   CLINICAL DATA:  Acute onset of vomiting and constipation. Recent small bowel obstruction, status post laparoscopy. Initial encounter.  EXAM: DG ABDOMEN ACUTE W/ 1V CHEST  COMPARISON:  CT of the abdomen and pelvis from 05/04/2015  FINDINGS: The lungs are well-aerated. Mild vascular congestion is noted. Small bilateral pleural effusions are seen, left greater than right. No pneumothorax is appreciated. The cardiomediastinal silhouette remains normal in size. A right-sided dual-lumen catheter is noted ending about the cavoatrial junction.  There is dilatation of small bowel loops up to 6.4 cm in maximal diameter, concerning for recurrent small bowel obstruction. Alternatively, ileus might have a similar appearance. Contrast is noted within the colon, extending to the rectum. No free intra-abdominal air is identified on the provided upright view.  No acute osseous abnormalities are seen; the sacroiliac joints are unremarkable in appearance.  IMPRESSION: 1. Dilatation of small bowel loops up to 6.4 cm in maximal diameter, concerning for recurrent small bowel obstruction. Alternately, ileus might have a similar appearance. Contrast noted within the colon, extending to the rectum. No free intra-abdominal air seen. 2. Mild vascular congestion noted. Small bilateral pleural effusions, left greater than right. Lungs otherwise clear.   Electronically Signed   By: Garald Balding M.D.   On: 05/10/2015 23:46    Anti-infectives: Anti-infectives    None       Assessment/Plan SBO: POD #8 - Diagnostic laparoscopy, lysis of adhesions Ninfa Linden 05/05/15), POD [id number] s/p umbilical hernia repair and PD cath removal Re-admitted 05/11/15 for recurrent SBO -Last surgery Dr. Ninfa Linden did not see actual obstruction associated with the mesh.  -Wonder if this is more ileus vs dysmotility. He's been passing flatus since  discharge and at home. He has several BM's. No pain or vomiting.  Was a bit nauseated this am, but he's hungry -Ambulate much more and IS -Advance to fulls then soft at dinner if tolerating. May need to have more frequent smaller meals and soft foods to make it easier to digest if there is some component of dysmotility. Does not have NG. -Added bowel regimen  ESRD. Cr. 11.9 today.  Hemodialysis, pending revision of AV fistula since its not maturing as vascular would like - on 05/26/15 Hypertension DM. Management per medicine  OSA. - CPAP     LOS: 1 day    Nat Christen 05/12/2015, 8:04 AM Pager: [phone]

## 2015-05-12 NOTE — Progress Notes (Signed)
PROGRESS NOTE  Robert [name] [id number] DOB: Aug 19, 1961 DOA: 05/10/2015 PCP: Haywood Pao, MD  Brief History 54 y.o. male with PMH of left nephrectomy, recent umbilical hernia repair, s/p of surgery of small bowel obstruction, hypertension, diabetes mellitus, hypothyroidism, OSA, ESRD-HD (MWF), SVT, who presents with nausea and vomiting. he had umbilical hernia repair and removal of PD catheter on 04/28/15.  He was readmitted on 05/04/15 with SBO.  He had laparoscopic lysis of adhesions for SBO on 05/04/15 (Dr. Coralie Keens).  He was discharged home on 05/08/15 on soft diet. The patient was readmitted 05/10/2015 for nausea and vomiting. CT of the abdomen and pelvis showed small bowel obstruction with transition point in the mid abdomen at the umbilicus.    Assessment/Plan: Small bowel obstruction -Appreciate general surgery follow-up  -Continue full liquid diet even upon discharge  -Increase activity  -Patient is passing flatus and having bowel movements  -Continue bowel regimen per general surgery  ESRD  -Appreciate renal follow-up  -Hemodialysis MWF phosphate  -binders per nephrology Diabetes mellitus type 2  -05/02/2015 hemoglobin A1c 5.9 continue  -Lantus 10 units daily  - NovoLog sliding scale  Hypertension  -Continue Bystolic  -Blood pressure controlled  Hypothyroidism  -Continue Synthroid  -05/02/2015 TSH 5.83 -Repeat TSH in 4 weeks after the patient is stable  Anemia of CKD -Aranesp per renal Severe obstructive sleep apnea: -CPAP   Family Communication:   Pt at beside Disposition Plan:   Home 05/13/15 after HD, if cleared by surgery       Procedures/Studies: Ct Abdomen Pelvis Wo Contrast  05/11/2015   CLINICAL DATA:  Diffuse abdominal pain.  Nausea and vomiting.  EXAM: CT ABDOMEN AND PELVIS WITHOUT CONTRAST  TECHNIQUE: Multidetector CT imaging of the abdomen and pelvis was performed following the standard protocol without IV contrast.   COMPARISON:  05/04/2015  FINDINGS: There is persistent small bowel obstruction with marked dilatation of proximal and mid small bowel. There is transition to decompressed distal small bowel at the level of the umbilicus. There is a small complex collection of soft tissue and air which may be postoperative in nature, as there was a 3 x 6 cm seroma in this location on 05/04/2015. However, there also is a 2 cm soft tissue nodule in the mesentery adjacent the small bowel on axial image 60, series 2 and coronal image 37, series 5. This is new or more prominent than 05/04/2015 and may represent focal inflammatory change. There is no interval change in the 1.9 cm soft tissue nodule in the left nephrectomy bed.  There are unremarkable unenhanced appearances of the liver, spleen, pancreas, adrenals and right kidney. Abdominal aorta is normal caliber with mild atherosclerotic calcification. Mild atelectatic appearing dependent posterior base opacities are present in both lungs small pleural fluid collections are present bilaterally. There is no significant musculoskeletal lesion.  IMPRESSION: 1. Persistent small bowel obstruction, transition point in the mid abdomen at the level of the umbilicus. 2. The presumed seroma at the deep aspect of the abdominal wall musculature at the umbilicus is smaller and now is comprised of a small volume of air and soft tissue. No significant acute inflammatory changes are evident in association with this finding in the herniorrhaphy bed. 3. New nodular soft tissue thickening in the mesentery, possibly inflammatory, but indeterminate. 4. Unchanged 1.9 cm soft tissue nodule in the left nephrectomy bed, indeterminate   Electronically Signed   By: Valerie Roys.D.  On: 05/11/2015 00:45   Ct Abdomen Pelvis Wo Contrast  05/04/2015   CLINICAL DATA:  Initial evaluation for acute diffuse abdominal pain with nausea and vomiting.  EXAM: CT ABDOMEN AND PELVIS WITHOUT CONTRAST  TECHNIQUE:  Multidetector CT imaging of the abdomen and pelvis was performed following the standard protocol without IV contrast.  COMPARISON:  Prior radiograph from 02/23/2015  FINDINGS: Bibasilar atelectatic changes seen dependently within the visualized lung bases. Prominent extrapleural fat. Visualized lung bases are otherwise unremarkable.  Limited noncontrast evaluation of the liver is unremarkable. Gallbladder within normal limits. No biliary dilatation. Spleen, adrenal glands, and pancreas are within normal limits.  Right kidney is atrophic in appearance with scattered small hypodensities, not well evaluated on this noncontrast examination. Patient is status post left oophorectomy. 2 cm nodular density within the left renal fossa is not significantly changed relative to previous.  Stomach is mildly distended with a error and enteric contrast material within the gastric lumen. Duodenum sweep within normal limits. There are multiple dilated loops of proximal small bowel within the mid and right abdomen with associated air-fluid levels. Loops measure up to 4.1 cm in diameter. There is a transition point within the mid abdomen. There is an adjacent ovoid well-circumscribed hypodense collection at this site that measures 3.2 x 6.5 x 5.6 cm (series 201, image 57). This is positioned just beneath the elbow like is, and may be reflect a seroma related to prior umbilical hernia repair. Distally, the small bowel is decompressed without acute inflammatory changes. Appendix is normal. Colon is decompressed. Few scattered noninflamed colonic diverticula noted within the descending colon.  Bladder within normal limits.  Prostate normal.  Small bilateral fat containing inguinal hernias noted.  No free intraperitoneal air. Small volume free fluid along the right pericolic gutter. No pathologically enlarged intra-abdominal pelvic lymph nodes. Moderate atheromatous plaque present within the intra-abdominal aorta. No aneurysm.  No acute  osseous abnormality. No worrisome lytic or blastic osseous lesion.  Injection sites noted within the subcu fat of the anterior abdomen.  IMPRESSION: 1. Findings consistent with acute small bowel obstruction with transition point at the level of the umbilicus. There is a 3.2 x 6.5 x 5.6 cm hypodense collection at the transition point, which may reflect a postoperative seroma related to prior umbilical hernia repair. 2. No other acute intra-abdominal or pelvic process. 3. Mild colonic diverticulosis without acute diverticulitis. 4. Status post left nephrectomy.   Electronically Signed   By: Jeannine Boga M.D.   On: 05/04/2015 06:45   Dg Abd Acute W/chest  05/10/2015   CLINICAL DATA:  Acute onset of vomiting and constipation. Recent small bowel obstruction, status post laparoscopy. Initial encounter.  EXAM: DG ABDOMEN ACUTE W/ 1V CHEST  COMPARISON:  CT of the abdomen and pelvis from 05/04/2015  FINDINGS: The lungs are well-aerated. Mild vascular congestion is noted. Small bilateral pleural effusions are seen, left greater than right. No pneumothorax is appreciated. The cardiomediastinal silhouette remains normal in size. A right-sided dual-lumen catheter is noted ending about the cavoatrial junction.  There is dilatation of small bowel loops up to 6.4 cm in maximal diameter, concerning for recurrent small bowel obstruction. Alternatively, ileus might have a similar appearance. Contrast is noted within the colon, extending to the rectum. No free intra-abdominal air is identified on the provided upright view.  No acute osseous abnormalities are seen; the sacroiliac joints are unremarkable in appearance.  IMPRESSION: 1. Dilatation of small bowel loops up to 6.4 cm in maximal diameter, concerning for  recurrent small bowel obstruction. Alternately, ileus might have a similar appearance. Contrast noted within the colon, extending to the rectum. No free intra-abdominal air seen. 2. Mild vascular congestion noted.  Small bilateral pleural effusions, left greater than right. Lungs otherwise clear.   Electronically Signed   By: Garald Balding M.D.   On: 05/10/2015 23:46         Subjective:  patient is having bowel movements and passing flatus. He denies any fevers, chills, chest pain, shortness breath, vomiting, diarrhea. Denies any hematochezia or melena.   Objective: Filed Vitals:   05/11/15 2141 05/12/15 0532 05/12/15 1039 05/12/15 1400  BP: 97/50 115/74  151/130  Pulse: 79 65 88 80  Temp: 98.5 F (36.9 C) 97.5 F (36.4 C)  98.2 F (36.8 C)  TempSrc: Oral Oral  Oral  Resp: 18 18    Height:      Weight:      SpO2: 93% 95% 96% 100%    Intake/Output Summary (Last 24 hours) at 05/12/15 1810 Last data filed at 05/12/15 1755  Gross per 24 hour  Intake    860 ml  Output      0 ml  Net    860 ml   Weight change: -1.572 kg (-3 lb 7.5 oz) Exam:   General:  Pt is alert, follows commands appropriately, not in acute distress  HEENT: No icterus, No thrush,  [place]/AT  Cardiovascular: RRR, S1/S2, no rubs, no gallops  Respiratory: CTA bilaterally, no wheezing, no crackles, no rhonchi  Abdomen: Soft/+BS, non tender, non distended, no guarding  Extremities: trace LE edema, No lymphangitis, No petechiae, No rashes, no synovitis  Data Reviewed: Basic Metabolic Panel:  Recent Labs Lab 05/06/15 0344 05/07/15 0322 05/08/15 0418 05/10/15 2153 05/11/15 0521  NA 138 137 136 138 139  K 4.9 4.1 4.0 3.6 3.7  CL 95* 96* 94* 94* 96*  CO2 25 28 25 27 27   GLUCOSE 87 114* 108* 138* 106*  BUN 25* 25* 44* 42* 42*  CREATININE 9.33* 8.32* 11.12* 10.41* 11.09*  CALCIUM 8.7* 9.5 9.6 9.8 9.6   Liver Function Tests:  Recent Labs Lab 05/10/15 2153  AST 32  ALT 18  ALKPHOS 85  BILITOT 0.6  PROT 8.4*  ALBUMIN 4.3    Recent Labs Lab 05/10/15 2153  LIPASE 77*   No results for input(s): AMMONIA in the last 168 hours. CBC:  Recent Labs Lab 05/06/15 0344 05/07/15 0322 05/10/15 2153  05/11/15 0521  WBC 8.7 8.1 11.1* 9.5  HGB 9.8* 9.7* 11.0* 9.9*  HCT 31.6* 30.4* 34.2* 30.5*  MCV 102.9* 98.7 99.4 99.0  PLT 232 251 421* 330   Cardiac Enzymes: No results for input(s): CKTOTAL, CKMB, CKMBINDEX, TROPONINI in the last 168 hours. BNP: Invalid input(s): POCBNP CBG:  Recent Labs Lab 05/11/15 1729 05/11/15 2252 05/12/15 0754 05/12/15 1203 05/12/15 1654  GLUCAP 117* 112* 105* 178* 141*    Recent Results (from the past 240 hour(s))  Surgical pcr screen     Status: None   Collection Time: 05/04/15 11:25 AM  Result Value Ref Range Status   MRSA, PCR NEGATIVE NEGATIVE Final   Staphylococcus aureus NEGATIVE NEGATIVE Final    Comment:        The Xpert SA Assay (FDA approved for NASAL specimens in patients over 38 years of age), is one component of a comprehensive surveillance program.  Test performance has been validated by [facility] for patients greater than or equal to 61 year old. It is  not intended to diagnose infection nor to guide or monitor treatment.      Scheduled Meds: . aspirin  81 mg Oral q morning - 10a  . calcitRIOL  0.5 mcg Oral Daily  . calcium acetate  667 mg Oral TID WC  . darbepoetin (ARANESP) injection - DIALYSIS  200 mcg Intravenous Q Wed-HD  . docusate sodium  100 mg Oral BID  . feeding supplement  1 Container Oral BID BM  . ferric gluconate (FERRLECIT/NULECIT) IV  125 mg Intravenous Q M,W,F-HD  . insulin aspart  0-9 Units Subcutaneous TID WC  . insulin glargine  10 Units Subcutaneous Daily  . lanthanum  2,000 mg Oral TID WC  . levothyroxine  100 mcg Oral QAC breakfast  . loratadine  10 mg Oral Daily  . nebivolol  15 mg Oral Daily  . polyethylene glycol  17 g Oral Daily  . sodium chloride  3 mL Intravenous Q12H   Continuous Infusions: . sodium chloride 1,000 mL (05/12/15 1114)     Aryav Wimberly, DO  [facility] Pager [phone]  If 7PM-7AM, please contact night-coverage [email or web address] Password [email or web address] 05/12/2015, 6:10  PM   LOS: 1 day

## 2015-05-12 NOTE — Progress Notes (Signed)
Patient ID: Robert [name], male   DOB: 1961-05-17, 54 y.o.   MRN: [id number]  [facility] Progress Note    Subjective:   Feels well   Objective:   BP 115/74 mmHg  Pulse 65  Temp(Src) 97.5 F (36.4 C) (Oral)  Resp 18  Ht 6' (1.829 m)  Wt 131.4 kg (289 lb 11 oz)  BMI 39.28 kg/m2  SpO2 95%  Intake/Output: I/O last 3 completed shifts: In: 0  Out: 2200 [Other:2200]   Intake/Output this shift:  Total I/O In: 360 [P.O.:360] Out: -  Weight change: -1.572 kg (-3 lb 7.5 oz)  Physical Exam: Gen:WD WN WM in NAD CVS: no rub Resp:cta [id number], +BS, soft, NT/ND Ext:no edema, right cimino avf +T/B  Labs: BMET  Recent Labs Lab 05/06/15 0344 05/07/15 0322 05/08/15 0418 05/10/15 2153 05/11/15 0521  NA 138 137 136 138 139  K 4.9 4.1 4.0 3.6 3.7  CL 95* 96* 94* 94* 96*  CO2 25 28 25 27 27   GLUCOSE 87 114* 108* 138* 106*  BUN 25* 25* 44* 42* 42*  CREATININE 9.33* 8.32* 11.12* 10.41* 11.09*  ALBUMIN  --   --   --  4.3  --   CALCIUM 8.7* 9.5 9.6 9.8 9.6   CBC  Recent Labs Lab 05/06/15 0344 05/07/15 0322 05/10/15 2153 05/11/15 0521  WBC 8.7 8.1 11.1* 9.5  HGB 9.8* 9.7* 11.0* 9.9*  HCT 31.6* 30.4* 34.2* 30.5*  MCV 102.9* 98.7 99.4 99.0  PLT 232 251 421* 330    @IMGRELPRIORS @ Medications:    . aspirin  81 mg Oral q morning - 10a  . calcitRIOL  0.5 mcg Oral Daily  . calcium acetate  667 mg Oral TID WC  . darbepoetin (ARANESP) injection - DIALYSIS  200 mcg Intravenous Q Wed-HD  . docusate sodium  100 mg Oral BID  . feeding supplement  1 Container Oral BID BM  . ferric gluconate (FERRLECIT/NULECIT) IV  125 mg Intravenous Q M,W,F-HD  . insulin aspart  0-9 Units Subcutaneous TID WC  . insulin glargine  10 Units Subcutaneous Daily  . lanthanum  2,000 mg Oral TID WC  . levothyroxine  100 mcg Oral QAC breakfast  . loratadine  10 mg Oral Daily  . nebivolol  15 mg Oral Daily  . polyethylene glycol  17 g Oral Daily  . sodium chloride  3 mL  Intravenous Q12H   Dialysis Orders: Center: [facility] on MWF . EDW 131kg HD [place] 2.0 K 2.0 Ca 1 Mg Time 4 hours 30 minutes Heparin 14000 units per treatment. Access R perm cath Optiflux 200NRe BFR 450 DFR 800  Venofer 100 mcg IV  Aransep 200 mcg IV weekly  Calcitriol 1.5 mcg PO MWF   Assessment/ Plan:   1. Partial SBO- feeling well.  Passing gas and continues to have BM's.  Being advanced to full liquids per General surgery evaluation 2. ESRD; continue with MWF HD and plan to change his EDW (was 140.5 and weighed 132.4kg today so will set new EDW at 130) 3. Anemia of chronic disease- continue with Aranesp and give dose today.  4. CKD-MBD: cont with binders if ok with Gen surgery 5. Nutrition:per primary svc 6. Hypertension:stable 7. Vascular access- poorly maturing AVF to have revision by Dr. Trula Slade.  Kytzia Gienger A 05/12/2015, 10:24 AM

## 2015-05-12 NOTE — Progress Notes (Signed)
Visit to pt room... Pt has home Cpap he advises that he will put on himself.

## 2015-05-12 NOTE — Evaluation (Signed)
Physical Therapy Evaluation Patient Details Name: Robert [name] MRN: [id number] DOB: March 11, 1961 Today's Date: 05/12/2015   History of Present Illness  54 y.o. male with PMH of left nephrectomy, recent umbilical hernia repair, s/p of surgery of small bowel obstruction, hypertension, diabetes mellitus, hypothyroidism, OSA, ESRD-HD (MWF), SVT, who presents with nausea and vomiting.  Now admitted for SBO.   Clinical Impression  Pt is independent with all mobility and was able to demonstrate safety on steps simulating home entry and access to his upstairs bedroom.  He is slow to move, caused by generalized fatigue. I encouraged TID walks and gave a balance HEP (he stated tandem and SLS that he tried during OT assessment were "challenging").  PT will sign off as pt is independent.     Follow Up Recommendations No PT follow up    Equipment Recommendations  None recommended by PT    Recommendations for Other Services   NA    Precautions / Restrictions Precautions Precautions: None Restrictions Weight Bearing Restrictions: No      Mobility  Bed Mobility Overal bed mobility: Independent                Transfers Overall transfer level: Independent Equipment used: None                Ambulation/Gait Ambulation/Gait assistance: Independent Ambulation Distance (Feet): 250 Feet   Gait Pattern/deviations: WFL(Within Functional Limits) Gait velocity: decreased Gait velocity interpretation: Below normal speed for age/gender General Gait Details: slow, but steady  Stairs Stairs: Yes Stairs assistance: Modified independent (Device/Increase time) Stair Management: No rails;One rail Right;One rail Left;Alternating pattern;Forwards Number of Stairs: 14 (two flights) General stair comments: alternating pattern, use of rail when needed, able to simulate the three with no rail for home entry.  Slow speed, stopped on landing to "catch my breath"         Balance Overall  balance assessment: No apparent balance deficits (not formally assessed) (OT tested)                                           Pertinent Vitals/Pain Pain Assessment: No/denies pain    Home Living Family/patient expects to be discharged to:: Private residence Living Arrangements: Spouse/significant other Available Help at Discharge: Family;Available PRN/intermittently Type of Home: House Home Access: Stairs to enter Entrance Stairs-Rails: None Entrance Stairs-Number of Steps: 3 Home Layout: Two level Home Equipment: None;Grab bars - toilet;Grab bars - tub/shower;Shower seat;Cane - single point Additional Comments: Reports peripherial neuropathy with decreased sensation in feet.  Wears diabetic shoes which make him feel more stable.     Prior Function Level of Independence: Independent         Comments: only used cane when he was dizzy and in confined space     Hand Dominance   Dominant Hand: Left    Extremity/Trunk Assessment   Upper Extremity Assessment: Defer to OT evaluation           Lower Extremity Assessment: Overall WFL for tasks assessed      Cervical / Trunk Assessment: Normal  Communication   Communication: No difficulties  Cognition Arousal/Alertness: Awake/alert Behavior During Therapy: WFL for tasks assessed/performed Overall Cognitive Status: Within Functional Limits for tasks assessed                         Exercises Other Exercises Other  Exercises: Balance HEP given of tandem and SLS. He reported difficulty with these while working wiht OT.       Assessment/Plan    PT Assessment Patent does not need any further PT services  PT Diagnosis Generalized weakness         PT Goals (Current goals can be found in the Care Plan section) Acute Rehab PT Goals Patient Stated Goal: to stop coming into the hospital PT Goal Formulation: All assessment and education complete, DC therapy     End of Session   Activity  Tolerance: Patient tolerated treatment well Patient left: in bed (seated EOB) Nurse Communication: Mobility status         Time: [id number] PT Time Calculation (min) (ACUTE ONLY): 10 min   Charges:   PT Evaluation $Initial PT Evaluation Tier I: 1 Procedure          Juniper Cobey B. Marguerita Stapp, PT, DPT [phone]   05/12/2015, 11:23 AM

## 2015-05-12 NOTE — Evaluation (Signed)
Occupational Therapy Evaluation Patient Details Name: Robert [name] MRN: [id number] DOB: 1960-11-13 Today's Date: 05/12/2015    History of Present Illness 54 y.o. male with PMH of left nephrectomy, recent umbilical hernia repair, s/p of surgery of small bowel obstruction, hypertension, diabetes mellitus, hypothyroidism, OSA, ESRD-HD (MWF), SVT, who presents with nausea and vomiting.  Now admitted for SBO.    Clinical Impression   Pt is currently independent with selfcare tasks and simulated ADLs at this time.  No issues with dynamic balance related to these tasks.  Ambulated in the hallway and around the unit without difficulty.  Slower rate of speed noted but pt reports this has been his usual since recently.  No further OT needs at this time.     Follow Up Recommendations  No OT follow up    Equipment Recommendations  None recommended by OT       Precautions / Restrictions Precautions Precautions: None Restrictions Weight Bearing Restrictions: No      Mobility Bed Mobility Overal bed mobility: Independent                Transfers Overall transfer level: Independent Equipment used: None                  Balance Overall balance assessment: Independent                                          ADL Overall ADL's : Independent;Modified independent                                       General ADL Comments: Pt has a shower seat for home if needed.  No balance deficits noted with selfcare simulation.     Vision Vision Assessment?: No apparent visual deficits   Perception Perception Perception Tested?: No   Praxis Praxis Praxis tested?: Within functional limits    Pertinent Vitals/Pain Pain Assessment: No/denies pain     Hand Dominance Left   Extremity/Trunk Assessment Upper Extremity Assessment Upper Extremity Assessment: Overall WFL for tasks assessed (4/5 in the right shoulder but has dialysis catheter on  that side)   Lower Extremity Assessment Lower Extremity Assessment: Defer to PT evaluation   Cervical / Trunk Assessment Cervical / Trunk Assessment: Normal   Communication Communication Communication: No difficulties   Cognition Arousal/Alertness: Awake/alert Behavior During Therapy: WFL for tasks assessed/performed Overall Cognitive Status: Within Functional Limits for tasks assessed                                Home Living Family/patient expects to be discharged to:: Private residence Living Arrangements: Spouse/significant other Available Help at Discharge: Family;Available PRN/intermittently Type of Home: House Home Access: Stairs to enter [employer] of Steps: 3 Entrance Stairs-Rails: None Home Layout: Two level Alternate Level Stairs-Number of Steps: 2 flights of steps Alternate Level Stairs-Rails: Right Bathroom Shower/Tub: Tub/shower unit ( on second floor) Shower/tub characteristics: Curtain       Home Equipment: None;Grab bars - toilet;Grab bars - tub/shower;Shower seat;Cane - single point          Prior Functioning/Environment Level of Independence: Independent        Comments: only used cane when he was dizzy and in confined space  End of Session Equipment Utilized During Treatment: Gait belt Nurse Communication: Mobility status  Activity Tolerance: Patient tolerated treatment well Patient left: in bed   Time: 1010-1034 OT Time Calculation (min): 24 min Charges:  OT General Charges $OT Visit: 1 Procedure OT Evaluation $Initial OT Evaluation Tier I: 1 Procedure  Jalaiya Oyster OTR/L 05/12/2015, 10:44 AM

## 2015-05-12 NOTE — Progress Notes (Signed)
Utilization Review completed. Saraya Tirey RN BSN CM 

## 2015-05-13 LAB — GLUCOSE, CAPILLARY
GLUCOSE-CAPILLARY: 130 mg/dL — AB (ref 65–99)
Glucose-Capillary: 136 mg/dL — ABNORMAL HIGH (ref 65–99)

## 2015-05-13 LAB — RENAL FUNCTION PANEL
Albumin: 3.6 g/dL (ref 3.5–5.0)
Anion gap: 14 (ref 5–15)
BUN: 37 mg/dL — ABNORMAL HIGH (ref 6–20)
CO2: 22 mmol/L (ref 22–32)
Calcium: 9.8 mg/dL (ref 8.9–10.3)
Chloride: 98 mmol/L — ABNORMAL LOW (ref 101–111)
Creatinine, Ser: 10.76 mg/dL — ABNORMAL HIGH (ref 0.61–1.24)
GFR calc non Af Amer: 5 mL/min — ABNORMAL LOW (ref >60)
GFR, EST AFRICAN AMERICAN: 5 mL/min — AB (ref >60)
Glucose, Bld: 134 mg/dL — ABNORMAL HIGH (ref 65–99)
Phosphorus: 4.4 mg/dL (ref 2.5–4.6)
Potassium: 5.5 mmol/L — ABNORMAL HIGH (ref 3.5–5.1)
SODIUM: 134 mmol/L — AB (ref 135–145)

## 2015-05-13 LAB — CBC
HEMATOCRIT: 28.9 % — AB (ref 39.0–52.0)
Hemoglobin: 9.5 g/dL — ABNORMAL LOW (ref 13.0–17.0)
MCH: 31.8 pg (ref 26.0–34.0)
MCHC: 32.9 g/dL (ref 30.0–36.0)
MCV: 96.7 fL (ref 78.0–100.0)
Platelets: 327 10*3/uL (ref 150–400)
RBC: 2.99 MIL/uL — ABNORMAL LOW (ref 4.22–5.81)
RDW: 15.3 % (ref 11.5–15.5)
WBC: 7.1 10*3/uL (ref 4.0–10.5)

## 2015-05-13 MED ORDER — INSULIN GLARGINE 100 UNIT/ML SOLOSTAR PEN
15.0000 [IU] | PEN_INJECTOR | SUBCUTANEOUS | Status: DC
Start: 2015-05-13 — End: 2015-12-20

## 2015-05-13 MED ORDER — HEPARIN SODIUM (PORCINE) 1000 UNIT/ML DIALYSIS
20.0000 [IU]/kg | INTRAMUSCULAR | Status: DC | PRN
  Filled 2015-05-13: qty 3

## 2015-05-13 MED ORDER — ONDANSETRON 4 MG PO TBDP: 4.0000 mg | ORAL_TABLET | Freq: Three times a day (TID) | ORAL | Status: DC | PRN

## 2015-05-13 NOTE — Progress Notes (Signed)
Pt is being discharged at this time. VSS. Discharge paperwork was given to pt and all questions were answered at this time time. All belongings have been returned to the pt.

## 2015-05-13 NOTE — Progress Notes (Signed)
[facility] Progress Note  Assessment/Plan: 1. Recurrent partial SBO-s/p recent umbilical hernia repair. Passing gas and continues to have BM's. On full liquids - surgery following conservatively 2. ESRD; continue with MWF HD and plan to change his EDW (was 140.5 and weighed 132.4kg  so will set new EDW at 130 per Dr. Marval Regal) 3. Anemia of chronic disease- continue with Aranesp 200 q Wed 4. CKD-MBD: cont with binders if ok with Gen surgery 5. Nutrition: advanced to FL tolerating - to stay on until he goes back to [facility] 8.15 6. Hypertension:stable - titrating EDW, net UF 2.2 on Wed with post weight 131.4 - he should have standing weights! 7. Vascular access- poorly maturing AVF to have revision by Dr. Trula Slade. 8/18  Myriam Jacobson, PA-C [facility] Beeper [phone] 05/13/2015,12:05 PM  LOS: 2 days   Subjective:   Anticipates going home today post HD- supposed to stay on FL until he goes back to see surgery for post-op f/u Objective Filed Vitals:   05/12/15 1039 05/12/15 1400 05/12/15 2107 05/13/15 0606  BP:  151/130 133/114 123/57  Pulse: 88 80 76 78  Temp:  98.2 F (36.8 C) 99.5 F (37.5 C)   TempSrc:  Oral Oral   Resp:   20 18  Height:      Weight:      SpO2: 96% 100% 97% 98%   Physical Exam General: NAD Heart: RRR Lungs: no rales Abdomen: distended + BS Extremities: tr LE edema obese Dialysis Access: right AVF + bruit right IJ  Dialysis Orders: Center: [place] on MWF . EDW 140.5 kg (not reflective of EDW lowered after d/c on 7/31) HD Bath 2.0 K 2.0 Ca 1 Mg Time 4 hours 30 minutes Heparin 14000 units per treatment. Access R perm cath Optiflux 200NRe BFR 450 DFR 800  Venofer 100 mcg IV (last dose 05/02/2015) needs 7 more doses Aransep 200 mcg IV weekly (Last dose 04/27/2015)  Calcitriol 1.5 mcg PO MWF (last dose 05/02/2015)  Additional Objective Labs: Basic Metabolic Panel:  Recent Labs Lab 05/08/15 0418 05/10/15 2153  05/11/15 0521  NA 136 138 139  K 4.0 3.6 3.7  CL 94* 94* 96*  CO2 25 27 27   GLUCOSE 108* 138* 106*  BUN 44* 42* 42*  CREATININE 11.12* 10.41* 11.09*  CALCIUM 9.6 9.8 9.6   Liver Function Tests:  Recent Labs Lab 05/10/15 2153  AST 32  ALT 18  ALKPHOS 85  BILITOT 0.6  PROT 8.4*  ALBUMIN 4.3    Recent Labs Lab 05/10/15 2153  LIPASE 77*   CBC:  Recent Labs Lab 05/07/15 0322 05/10/15 2153 05/11/15 0521  WBC 8.1 11.1* 9.5  HGB 9.7* 11.0* 9.9*  HCT 30.4* 34.2* 30.5*  MCV 98.7 99.4 99.0  PLT 251 421* 330  CBG:  Recent Labs Lab 05/12/15 0754 05/12/15 1203 05/12/15 1654 05/12/15 2111 05/13/15 0800  GLUCAP 105* 178* 141* 144* 130*  Medications:   . aspirin  81 mg Oral q morning - 10a  . calcitRIOL  0.5 mcg Oral Daily  . calcium acetate  667 mg Oral TID WC  . darbepoetin (ARANESP) injection - DIALYSIS  200 mcg Intravenous Q Wed-HD  . docusate sodium  100 mg Oral BID  . feeding supplement  1 Container Oral BID BM  . ferric gluconate (FERRLECIT/NULECIT) IV  125 mg Intravenous Q M,W,F-HD  . insulin aspart  0-9 Units Subcutaneous TID WC  . insulin glargine  10 Units Subcutaneous Daily  . lanthanum  2,000 mg Oral TID WC  . levothyroxine  100 mcg Oral QAC breakfast  . loratadine  10 mg Oral Daily  . nebivolol  15 mg Oral Daily  . polyethylene glycol  17 g Oral Daily  . sodium chloride  3 mL Intravenous Q12H    I have seen and examined this patient and agree with plan as outlined in the above note. Pt says he will be going home on a full liquid diet and followup with Dr. Rush Farmer on the 15th.  He is also scheduled for a R AVF revision on the 18th by Dr. Trula Slade.  He will need HD before he leaves today - HD is aware .Jamal Maes, MD [facility] [phone] Pager 05/13/2015, 12:54 PM

## 2015-05-13 NOTE — Procedures (Signed)
I have personally attended this patient's dialysis session.   2K bath for K of 5.5 TDC 400 Pre weight 137 discrepant from prior but was standing weight so MAY not need drop in EDW to 130 - will see where he comes of HD today Anticipate d/c home later today  Jamal Maes, MD [place] Pager 05/13/2015, 3:38 PM

## 2015-05-13 NOTE — Discharge Summary (Addendum)
Physician Discharge Summary  Robert [name] [id number] DOB: Sep 13, 1961 DOA: 05/10/2015  PCP: Haywood Pao, MD  Admit date: 05/10/2015 Discharge date: 05/13/2015  Recommendations for Outpatient Follow-up:  1. Pt will need to follow up with PCP in 2 weeks post discharge 2. Please obtain BMP and CBC in 1-2 weeks  Discharge Diagnoses:  Small bowel obstruction -Appreciate general surgery follow-up  -Continue full liquid diet even upon discharge  -Increase activity  -Patient is passing flatus and having bowel movements  -Continue bowel regimen per general surgery --Colace and Senokot -case discussed with general surgery on day of d/c--continue full liquids until pt follows up with Dr. Ninfa Linden on 05/23/15 -PRN zofran for nausea ESRD  -Appreciate renal follow-up  -Hemodialysis MWF phosphate  -binders per nephrology Diabetes mellitus type 2  -05/02/2015 hemoglobin A1c 5.9 continue  -Patient states that he normally takes Lantus 30 units daily -While the patient remains on a full liquid diet, the patient was instructed to take Lantus 15 units daily until he is advanced back to his normal renal diet -The patient's CBGs have remained controlled during the hospitalization - NovoLog sliding scale--patient was instructed to take 6 units in the a.m., 8 units in the p.m. which is lower than his usual dose. He was instructed to continue checking his CBGs and to use a lower dose of his NovoLog scale until he is able to be progressed back to his normal renal diet. At baseline, the patient uses 12 units in the morning, 18 units in the PM. Hypertension  -Continue Bystolic  -Blood pressure controlled  Hypothyroidism  -Continue Synthroid  -05/02/2015 TSH 5.83 -Repeat TSH in 4 weeks after the patient is stable  Anemia of CKD -Aranesp per renal Severe obstructive sleep apnea: -CPAP  Discharge Condition: stable  Disposition: home Follow-up Information    Schedule an appointment  as soon as possible for a visit with Coralie Keens A, MD.   Specialty:  General Surgery   Why:  For post-operation check   Contact information:   [place] [address] [phone]       Diet:full liquid diet Wt Readings from Last 3 Encounters:  05/11/15 131.4 kg (289 lb 11 oz)  05/06/15 137 kg (302 lb 0.5 oz)  04/28/15 143.79 kg (317 lb)    History of present illness:  54 y.o. male with PMH of left nephrectomy, recent umbilical hernia repair, s/p of surgery of small bowel obstruction, hypertension, diabetes mellitus, hypothyroidism, OSA, ESRD-HD (MWF), SVT, who presents with nausea and vomiting. he had umbilical hernia repair and removal of PD catheter on 04/28/15. He was readmitted on 05/04/15 with SBO. He had laparoscopic lysis of adhesions for SBO on 05/04/15 (Dr. Coralie Keens). He was discharged home on 05/08/15 on soft diet. The patient was readmitted 05/10/2015 for nausea and vomiting. CT of the abdomen and pelvis showed small bowel obstruction with transition point in the mid abdomen at the umbilicus. The patient improved clinically with conservative care. He continued to pass flatus and have bowel movements. His diet was advanced to full liquids. The patient tolerated his diet. The patient was instructed to continue full liquid diet until he follows up with his general surgeon on 05/23/2015.  Consultants: General surgery--[facility]  Discharge Exam: Filed Vitals:   05/13/15 0606  BP: 123/57  Pulse: 78  Temp:   Resp: 18   Filed Vitals:   05/12/15 1039 05/12/15 1400 05/12/15 2107 05/13/15 0606  BP:  151/130 133/114 123/57  Pulse: 88  80 76 78  Temp:  98.2 F (36.8 C) 99.5 F (37.5 C)   TempSrc:  Oral Oral   Resp:   20 18  Height:      Weight:      SpO2: 96% 100% 97% 98%   General: A&O x 3, NAD, pleasant, cooperative Cardiovascular: RRR, no rub, no gallop, no S3 Respiratory: CTAB, no wheeze, no rhonchi Abdomen:soft, nontender,  nondistended, positive bowel sounds Extremities: 1+ LE edema, No lymphangitis, no petechiae  Discharge Instructions      Discharge Instructions    Diet - low sodium heart healthy    Complete by:  As directed      Increase activity slowly    Complete by:  As directed             Medication List    STOP taking these medications        oxyCODONE-acetaminophen 5-325 MG per tablet  Commonly known as:  ROXICET      TAKE these medications        aspirin 81 MG tablet  Take 81 mg by mouth every morning.     BYSTOLIC 5 MG tablet  Generic drug:  nebivolol  Take 15 mg by mouth daily.     CALCITRIOL PO  Take 3 tablets by mouth 3 (three) times a week. Monday, Wednesday and Friday.     calcium acetate 667 MG capsule  Commonly known as:  PHOSLO  Take 667 mg by mouth 3 (three) times daily with meals.     cetirizine 10 MG tablet  Commonly known as:  ZYRTEC  Take 10 mg by mouth every morning.     docusate sodium 100 MG capsule  Commonly known as:  COLACE  Take 100 mg by mouth every evening.     fluocinonide cream 0.05 %  Commonly known as:  LIDEX  Apply 1 application topically daily as needed (Skin inflammation (hand, thigh, back)).     FOSRENOL 1000 MG chewable tablet  Generic drug:  lanthanum  Chew 1,000-2,000 mg by mouth 4 (four) times daily. 2000 mg with every meal and 1000 mg with snack     glucose blood test strip  Commonly known as:  ONETOUCH VERIO  Use as instructed to check blood sugar 6 a day dx code E11.29     Insulin Glargine 100 UNIT/ML Solostar Pen  Commonly known as:  LANTUS SOLOSTAR  Inject 15 Units into the skin every morning.     insulin regular human CONCENTRATED 500 UNIT/ML injection  Commonly known as:  HUMULIN R  Inject 6 units in the am and 8 units at supper, can slowly adjust back to prior regimen when Oral intake more reliable and Blood sugars tolerate     INSULIN SYRINGE 1CC/31GX5/16" 31G X 5/16" 1 ML Misc  Use to inject insulin      levothyroxine 100 MCG tablet  Commonly known as:  SYNTHROID, LEVOTHROID  TAKE 1 TABLET (100 MCG TOTAL) BY MOUTH DAILY.     NOVOFINE 32G X 6 MM Misc  Generic drug:  Insulin Pen Needle  USE 4 PEN NEEDLES PER DAY TO INJECT INSULIN     ondansetron 4 MG disintegrating tablet  Commonly known as:  ZOFRAN ODT  Take 1 tablet (4 mg total) by mouth every 8 (eight) hours as needed for nausea or vomiting.     OVER THE COUNTER MEDICATION  Take 1 tablet by mouth daily. OTC DiaVite     polyethylene glycol packet  Commonly known  as:  MIRALAX / GLYCOLAX  Take 17 g by mouth daily.     senna 8.6 MG tablet  Commonly known as:  SENOKOT  Take 1 tablet by mouth daily.         The results of significant diagnostics from this hospitalization (including imaging, microbiology, ancillary and laboratory) are listed below for reference.    Significant Diagnostic Studies: Ct Abdomen Pelvis Wo Contrast  05/11/2015   CLINICAL DATA:  Diffuse abdominal pain.  Nausea and vomiting.  EXAM: CT ABDOMEN AND PELVIS WITHOUT CONTRAST  TECHNIQUE: Multidetector CT imaging of the abdomen and pelvis was performed following the standard protocol without IV contrast.  COMPARISON:  05/04/2015  FINDINGS: There is persistent small bowel obstruction with marked dilatation of proximal and mid small bowel. There is transition to decompressed distal small bowel at the level of the umbilicus. There is a small complex collection of soft tissue and air which may be postoperative in nature, as there was a 3 x 6 cm seroma in this location on 05/04/2015. However, there also is a 2 cm soft tissue nodule in the mesentery adjacent the small bowel on axial image 60, series 2 and coronal image 37, series 5. This is new or more prominent than 05/04/2015 and may represent focal inflammatory change. There is no interval change in the 1.9 cm soft tissue nodule in the left nephrectomy bed.  There are unremarkable unenhanced appearances of the liver, spleen,  pancreas, adrenals and right kidney. Abdominal aorta is normal caliber with mild atherosclerotic calcification. Mild atelectatic appearing dependent posterior base opacities are present in both lungs small pleural fluid collections are present bilaterally. There is no significant musculoskeletal lesion.  IMPRESSION: 1. Persistent small bowel obstruction, transition point in the mid abdomen at the level of the umbilicus. 2. The presumed seroma at the deep aspect of the abdominal wall musculature at the umbilicus is smaller and now is comprised of a small volume of air and soft tissue. No significant acute inflammatory changes are evident in association with this finding in the herniorrhaphy bed. 3. New nodular soft tissue thickening in the mesentery, possibly inflammatory, but indeterminate. 4. Unchanged 1.9 cm soft tissue nodule in the left nephrectomy bed, indeterminate   Electronically Signed   By: Andreas Newport M.D.   On: 05/11/2015 00:45   Ct Abdomen Pelvis Wo Contrast  05/04/2015   CLINICAL DATA:  Initial evaluation for acute diffuse abdominal pain with nausea and vomiting.  EXAM: CT ABDOMEN AND PELVIS WITHOUT CONTRAST  TECHNIQUE: Multidetector CT imaging of the abdomen and pelvis was performed following the standard protocol without IV contrast.  COMPARISON:  Prior radiograph from 02/23/2015  FINDINGS: Bibasilar atelectatic changes seen dependently within the visualized lung bases. Prominent extrapleural fat. Visualized lung bases are otherwise unremarkable.  Limited noncontrast evaluation of the liver is unremarkable. Gallbladder within normal limits. No biliary dilatation. Spleen, adrenal glands, and pancreas are within normal limits.  Right kidney is atrophic in appearance with scattered small hypodensities, not well evaluated on this noncontrast examination. Patient is status post left oophorectomy. 2 cm nodular density within the left renal fossa is not significantly changed relative to previous.   Stomach is mildly distended with a error and enteric contrast material within the gastric lumen. Duodenum sweep within normal limits. There are multiple dilated loops of proximal small bowel within the mid and right abdomen with associated air-fluid levels. Loops measure up to 4.1 cm in diameter. There is a transition point within the mid abdomen. There  is an adjacent ovoid well-circumscribed hypodense collection at this site that measures 3.2 x 6.5 x 5.6 cm (series 201, image 57). This is positioned just beneath the elbow like is, and may be reflect a seroma related to prior umbilical hernia repair. Distally, the small bowel is decompressed without acute inflammatory changes. Appendix is normal. Colon is decompressed. Few scattered noninflamed colonic diverticula noted within the descending colon.  Bladder within normal limits.  Prostate normal.  Small bilateral fat containing inguinal hernias noted.  No free intraperitoneal air. Small volume free fluid along the right pericolic gutter. No pathologically enlarged intra-abdominal pelvic lymph nodes. Moderate atheromatous plaque present within the intra-abdominal aorta. No aneurysm.  No acute osseous abnormality. No worrisome lytic or blastic osseous lesion.  Injection sites noted within the subcu fat of the anterior abdomen.  IMPRESSION: 1. Findings consistent with acute small bowel obstruction with transition point at the level of the umbilicus. There is a 3.2 x 6.5 x 5.6 cm hypodense collection at the transition point, which may reflect a postoperative seroma related to prior umbilical hernia repair. 2. No other acute intra-abdominal or pelvic process. 3. Mild colonic diverticulosis without acute diverticulitis. 4. Status post left nephrectomy.   Electronically Signed   By: Jeannine Boga M.D.   On: 05/04/2015 06:45   Dg Abd Acute W/chest  05/10/2015   CLINICAL DATA:  Acute onset of vomiting and constipation. Recent small bowel obstruction, status post  laparoscopy. Initial encounter.  EXAM: DG ABDOMEN ACUTE W/ 1V CHEST  COMPARISON:  CT of the abdomen and pelvis from 05/04/2015  FINDINGS: The lungs are well-aerated. Mild vascular congestion is noted. Small bilateral pleural effusions are seen, left greater than right. No pneumothorax is appreciated. The cardiomediastinal silhouette remains normal in size. A right-sided dual-lumen catheter is noted ending about the cavoatrial junction.  There is dilatation of small bowel loops up to 6.4 cm in maximal diameter, concerning for recurrent small bowel obstruction. Alternatively, ileus might have a similar appearance. Contrast is noted within the colon, extending to the rectum. No free intra-abdominal air is identified on the provided upright view.  No acute osseous abnormalities are seen; the sacroiliac joints are unremarkable in appearance.  IMPRESSION: 1. Dilatation of small bowel loops up to 6.4 cm in maximal diameter, concerning for recurrent small bowel obstruction. Alternately, ileus might have a similar appearance. Contrast noted within the colon, extending to the rectum. No free intra-abdominal air seen. 2. Mild vascular congestion noted. Small bilateral pleural effusions, left greater than right. Lungs otherwise clear.   Electronically Signed   By: Garald Balding M.D.   On: 05/10/2015 23:46     Microbiology: Recent Results (from the past 240 hour(s))  Surgical pcr screen     Status: None   Collection Time: 05/04/15 11:25 AM  Result Value Ref Range Status   MRSA, PCR NEGATIVE NEGATIVE Final   Staphylococcus aureus NEGATIVE NEGATIVE Final    Comment:        The Xpert SA Assay (FDA approved for NASAL specimens in patients over 58 years of age), is one component of a comprehensive surveillance program.  Test performance has been validated by [facility] for patients greater than or equal to 67 year old. It is not intended to diagnose infection nor to guide or monitor treatment.       Labs: Basic Metabolic Panel:  Recent Labs Lab 05/07/15 0322 05/08/15 0418 05/10/15 2153 05/11/15 0521  NA 137 136 138 139  K 4.1 4.0 3.6 3.7  CL 96* 94* 94* 96*  CO2 28 25 27 27   GLUCOSE 114* 108* 138* 106*  BUN 25* 44* 42* 42*  CREATININE 8.32* 11.12* 10.41* 11.09*  CALCIUM 9.5 9.6 9.8 9.6   Liver Function Tests:  Recent Labs Lab 05/10/15 2153  AST 32  ALT 18  ALKPHOS 85  BILITOT 0.6  PROT 8.4*  ALBUMIN 4.3    Recent Labs Lab 05/10/15 2153  LIPASE 77*   No results for input(s): AMMONIA in the last 168 hours. CBC:  Recent Labs Lab 05/07/15 0322 05/10/15 2153 05/11/15 0521  WBC 8.1 11.1* 9.5  HGB 9.7* 11.0* 9.9*  HCT 30.4* 34.2* 30.5*  MCV 98.7 99.4 99.0  PLT 251 421* 330   Cardiac Enzymes: No results for input(s): CKTOTAL, CKMB, CKMBINDEX, TROPONINI in the last 168 hours. BNP: Invalid input(s): POCBNP CBG:  Recent Labs Lab 05/12/15 0754 05/12/15 1203 05/12/15 1654 05/12/15 2111 05/13/15 0800  GLUCAP 105* 178* 141* 144* 130*    Time coordinating discharge:  Greater than 30 minutes  Signed:  Bellarose Burtt, DO [facility] Pager: [phone] 05/13/2015, 10:36 AM

## 2015-05-13 NOTE — Progress Notes (Signed)
[facility] Progress Note     Subjective: Pt doing well, nausea minor.  No vomiting or abdominal pain.  Having BM's daily with help of bowel regimen.  No complaints.  Tolerating full liquid diet well.  Wants to go home after dialysis today.  Pending follow up with Dr. Ninfa Linden next Monday.  Objective: Vital signs in last 24 hours: Temp:  [98.2 F (36.8 C)-99.5 F (37.5 C)] 99.5 F (37.5 C) (08/04 2107) Pulse Rate:  [76-88] 78 (08/05 0606) Resp:  [18-20] 18 (08/05 0606) BP: (123-151)/(57-130) 123/57 mmHg (08/05 0606) SpO2:  [96 %-100 %] 98 % (08/05 0606) Last BM Date: 05/13/15  Intake/Output from previous day: 08/04 0701 - 08/05 0700 In: 1446.7 [P.O.:860; I.V.:586.7] Out: -  Intake/Output this shift: Total I/O In: 160 [P.O.:160] Out: -   PE: Gen:  Alert, NAD, pleasant Abd: Obese, soft, NT/ND, +BS, no HSM   Lab Results:   Recent Labs  05/10/15 2153 05/11/15 0521  WBC 11.1* 9.5  HGB 11.0* 9.9*  HCT 34.2* 30.5*  PLT 421* 330   BMET  Recent Labs  05/10/15 2153 05/11/15 0521  NA 138 139  K 3.6 3.7  CL 94* 96*  CO2 27 27  GLUCOSE 138* 106*  BUN 42* 42*  CREATININE 10.41* 11.09*  CALCIUM 9.8 9.6   PT/INR  Recent Labs  05/11/15 0521  LABPROT 15.0  INR 1.16   CMP     Component Value Date/Time   NA 139 05/11/2015 0521   K 3.7 05/11/2015 0521   CL 96* 05/11/2015 0521   CO2 27 05/11/2015 0521   GLUCOSE 106* 05/11/2015 0521   BUN 42* 05/11/2015 0521   CREATININE 11.09* 05/11/2015 0521   CALCIUM 9.6 05/11/2015 0521   CALCIUM 8.5 04/07/2014 1057   PROT 8.4* 05/10/2015 2153   ALBUMIN 4.3 05/10/2015 2153   AST 32 05/10/2015 2153   ALT 18 05/10/2015 2153   ALKPHOS 85 05/10/2015 2153   BILITOT 0.6 05/10/2015 2153   GFRNONAA 5* 05/11/2015 0521   GFRAA 5* 05/11/2015 0521   Lipase     Component Value Date/Time   LIPASE 77* 05/10/2015 2153       Studies/Results: No results found.  Anti-infectives: Anti-infectives    None        Assessment/Plan SBO: POD #9 - Diagnostic laparoscopy, lysis of adhesions Ninfa Linden 05/05/15), POD [id number] s/p umbilical hernia repair and PD cath removal Re-admitted 05/11/15 for recurrent SBO -Last surgery Dr. Ninfa Linden did not see actual obstruction associated with the mesh.  -Much improved, but wonder if this is more ileus vs dysmotility. He's been passing flatus since discharge and at home. He has several BM's. Less mild nausea present.  No pain or vomiting. -Ambulate much more and IS -Advance to fulls, Dr. Ninfa Linden wants him to go home on full liquids. May need to have more frequent smaller meals to make it easier to digest if there is some component of dysmotility.  -Bowel regimen, d/c home with zofran in case of nausea  ESRD. Hemodialysis, pending revision of AV fistula since its not maturing as vascular would like - on 05/26/15 Hypertension DM. Management per medicine  OSA. - CPAP  Disp - d/c home when medically stable, will sign off.  Patient is to follow up with Dr. Ninfa Linden for his post-operative check on 05/24/15 as already scheduled.    LOS: 2 days    Nat Christen 05/13/2015, 10:15 AM Pager: [phone]

## 2015-05-25 ENCOUNTER — Encounter (HOSPITAL_COMMUNITY): Payer: Self-pay | Admitting: *Deleted

## 2015-05-25 MED ORDER — SODIUM CHLORIDE 0.9 % IV SOLN
INTRAVENOUS | Status: DC
  Administered 2015-05-26 (×2): via INTRAVENOUS

## 2015-05-25 MED ORDER — CHLORHEXIDINE GLUCONATE CLOTH 2 % EX PADS: 6.0000 | MEDICATED_PAD | Freq: Once | CUTANEOUS | Status: DC

## 2015-05-25 MED ORDER — CEFUROXIME SODIUM 1.5 G IJ SOLR
1.5000 g | INTRAMUSCULAR | Status: AC
  Administered 2015-05-26: 1.5 g via INTRAVENOUS
  Filled 2015-05-25: qty 1.5

## 2015-05-25 NOTE — Progress Notes (Signed)
Pt denies SOB and chest pain. Pt is under the care of Dr. Shelva Majestic, cardiology. Pt denies having a cardiac cath. Pt advised to take half of HS insulin dose the night before surgery by MD. Pt verbalized understanding of all pre-op instructions.

## 2015-05-26 ENCOUNTER — Ambulatory Visit (HOSPITAL_COMMUNITY): Payer: BLUE CROSS/BLUE SHIELD | Admitting: Anesthesiology

## 2015-05-26 ENCOUNTER — Encounter (HOSPITAL_COMMUNITY): Payer: Self-pay | Admitting: *Deleted

## 2015-05-26 ENCOUNTER — Encounter (HOSPITAL_COMMUNITY)
Admission: RE | Disposition: A | Discharge status: Home / Self Care | Payer: Self-pay | Source: Ambulatory Visit | Attending: Surgery

## 2015-05-26 ENCOUNTER — Ambulatory Visit (HOSPITAL_COMMUNITY)
Admission: RE | Admit: 2015-05-26 | Discharge: 2015-05-26 | Disposition: A | Discharge status: Home / Self Care | Payer: BLUE CROSS/BLUE SHIELD | Source: Ambulatory Visit | Attending: Surgery | Admitting: Surgery

## 2015-05-26 DIAGNOSIS — I480 Paroxysmal atrial fibrillation: Secondary | ICD-10-CM | POA: Diagnosis not present

## 2015-05-26 DIAGNOSIS — T82898A Other specified complication of vascular prosthetic devices, implants and grafts, initial encounter: Secondary | ICD-10-CM | POA: Diagnosis not present

## 2015-05-26 DIAGNOSIS — Z79899 Other long term (current) drug therapy: Secondary | ICD-10-CM | POA: Diagnosis not present

## 2015-05-26 DIAGNOSIS — T82858A Stenosis of vascular prosthetic devices, implants and grafts, initial encounter: Secondary | ICD-10-CM | POA: Diagnosis not present

## 2015-05-26 DIAGNOSIS — E119 Type 2 diabetes mellitus without complications: Secondary | ICD-10-CM | POA: Diagnosis not present

## 2015-05-26 DIAGNOSIS — Z992 Dependence on renal dialysis: Secondary | ICD-10-CM | POA: Diagnosis not present

## 2015-05-26 DIAGNOSIS — Z87891 Personal history of nicotine dependence: Secondary | ICD-10-CM | POA: Diagnosis not present

## 2015-05-26 DIAGNOSIS — K5900 Constipation, unspecified: Secondary | ICD-10-CM | POA: Diagnosis not present

## 2015-05-26 DIAGNOSIS — T82590A Other mechanical complication of surgically created arteriovenous fistula, initial encounter: Secondary | ICD-10-CM | POA: Diagnosis present

## 2015-05-26 DIAGNOSIS — G4733 Obstructive sleep apnea (adult) (pediatric): Secondary | ICD-10-CM | POA: Diagnosis not present

## 2015-05-26 DIAGNOSIS — Y832 Surgical operation with anastomosis, bypass or graft as the cause of abnormal reaction of the patient, or of later complication, without mention of misadventure at the time of the procedure: Secondary | ICD-10-CM | POA: Diagnosis not present

## 2015-05-26 DIAGNOSIS — D649 Anemia, unspecified: Secondary | ICD-10-CM | POA: Diagnosis not present

## 2015-05-26 DIAGNOSIS — Z794 Long term (current) use of insulin: Secondary | ICD-10-CM | POA: Diagnosis not present

## 2015-05-26 DIAGNOSIS — E039 Hypothyroidism, unspecified: Secondary | ICD-10-CM | POA: Diagnosis not present

## 2015-05-26 DIAGNOSIS — N186 End stage renal disease: Secondary | ICD-10-CM | POA: Diagnosis not present

## 2015-05-26 DIAGNOSIS — H3530 Unspecified macular degeneration: Secondary | ICD-10-CM | POA: Diagnosis not present

## 2015-05-26 DIAGNOSIS — I120 Hypertensive chronic kidney disease with stage 5 chronic kidney disease or end stage renal disease: Secondary | ICD-10-CM | POA: Diagnosis not present

## 2015-05-26 HISTORY — DX: Failed or difficult intubation, initial encounter: T88.4XXA

## 2015-05-26 HISTORY — PX: REVISON OF ARTERIOVENOUS FISTULA: SHX6074

## 2015-05-26 HISTORY — DX: Unspecified intestinal obstruction, unspecified as to partial versus complete obstruction: K56.609

## 2015-05-26 LAB — GLUCOSE, CAPILLARY
Glucose-Capillary: 44 mg/dL — CL (ref 65–99)
Glucose-Capillary: 82 mg/dL (ref 65–99)

## 2015-05-26 LAB — POCT I-STAT 4, (NA,K, GLUC, HGB,HCT)
Glucose, Bld: 112 mg/dL — ABNORMAL HIGH (ref 65–99)
HCT: 31 % — ABNORMAL LOW (ref 39.0–52.0)
HEMOGLOBIN: 10.5 g/dL — AB (ref 13.0–17.0)
POTASSIUM: 5.3 mmol/L — AB (ref 3.5–5.1)
Sodium: 137 mmol/L (ref 135–145)

## 2015-05-26 SURGERY — REVISON OF ARTERIOVENOUS FISTULA
Anesthesia: General | Site: Arm Lower | Laterality: Right

## 2015-05-26 MED ORDER — GLYCOPYRROLATE 0.2 MG/ML IJ SOLN
INTRAMUSCULAR | Status: AC
  Filled 2015-05-26: qty 3

## 2015-05-26 MED ORDER — ROCURONIUM BROMIDE 50 MG/5ML IV SOLN
INTRAVENOUS | Status: AC
  Filled 2015-05-26: qty 1

## 2015-05-26 MED ORDER — HYDROMORPHONE HCL 1 MG/ML IJ SOLN
0.2500 mg | INTRAMUSCULAR | Status: DC | PRN
  Administered 2015-05-26: 0.5 mg via INTRAVENOUS

## 2015-05-26 MED ORDER — LACTATED RINGERS IV SOLN: INTRAVENOUS | Status: DC

## 2015-05-26 MED ORDER — SCOPOLAMINE 1 MG/3DAYS TD PT72: 1.0000 | MEDICATED_PATCH | Freq: Once | TRANSDERMAL | Status: DC

## 2015-05-26 MED ORDER — GLYCOPYRROLATE 0.2 MG/ML IJ SOLN
INTRAMUSCULAR | Status: DC | PRN
  Administered 2015-05-26: 0.6 mg via INTRAVENOUS

## 2015-05-26 MED ORDER — ONDANSETRON HCL 4 MG/2ML IJ SOLN
INTRAMUSCULAR | Status: DC | PRN
  Administered 2015-05-26: 4 mg via INTRAVENOUS

## 2015-05-26 MED ORDER — ROCURONIUM BROMIDE 100 MG/10ML IV SOLN
INTRAVENOUS | Status: DC | PRN
  Administered 2015-05-26: 25 mg via INTRAVENOUS

## 2015-05-26 MED ORDER — SUCCINYLCHOLINE CHLORIDE 20 MG/ML IJ SOLN
INTRAMUSCULAR | Status: DC | PRN
  Administered 2015-05-26: 140 mg via INTRAVENOUS

## 2015-05-26 MED ORDER — SUCCINYLCHOLINE CHLORIDE 20 MG/ML IJ SOLN
INTRAMUSCULAR | Status: AC
  Filled 2015-05-26: qty 1

## 2015-05-26 MED ORDER — MIDAZOLAM HCL 2 MG/2ML IJ SOLN
INTRAMUSCULAR | Status: AC
  Filled 2015-05-26: qty 4

## 2015-05-26 MED ORDER — OXYCODONE HCL 5 MG PO TABS: 5.0000 mg | ORAL_TABLET | Freq: Four times a day (QID) | ORAL | Status: DC | PRN

## 2015-05-26 MED ORDER — PROMETHAZINE HCL 25 MG/ML IJ SOLN
6.2500 mg | INTRAMUSCULAR | Status: DC | PRN
  Administered 2015-05-26: 6.25 mg via INTRAVENOUS

## 2015-05-26 MED ORDER — 0.9 % SODIUM CHLORIDE (POUR BTL) OPTIME
TOPICAL | Status: DC | PRN
  Administered 2015-05-26: 1000 mL

## 2015-05-26 MED ORDER — DEXAMETHASONE SODIUM PHOSPHATE 4 MG/ML IJ SOLN
INTRAMUSCULAR | Status: AC
  Filled 2015-05-26: qty 2

## 2015-05-26 MED ORDER — PHENYLEPHRINE 40 MCG/ML (10ML) SYRINGE FOR IV PUSH (FOR BLOOD PRESSURE SUPPORT)
PREFILLED_SYRINGE | INTRAVENOUS | Status: AC
  Filled 2015-05-26: qty 10

## 2015-05-26 MED ORDER — PROPOFOL 10 MG/ML IV BOLUS
INTRAVENOUS | Status: DC | PRN
  Administered 2015-05-26: 50 mg via INTRAVENOUS
  Administered 2015-05-26: 20 mg via INTRAVENOUS
  Administered 2015-05-26: 150 mg via INTRAVENOUS

## 2015-05-26 MED ORDER — HYDROMORPHONE HCL 1 MG/ML IJ SOLN
INTRAMUSCULAR | Status: AC
  Filled 2015-05-26: qty 1

## 2015-05-26 MED ORDER — MECLIZINE HCL 12.5 MG PO TABS
12.5000 mg | ORAL_TABLET | Freq: Once | ORAL | Status: DC
  Filled 2015-05-26: qty 1

## 2015-05-26 MED ORDER — ONDANSETRON HCL 4 MG/2ML IJ SOLN
INTRAMUSCULAR | Status: AC
  Filled 2015-05-26: qty 2

## 2015-05-26 MED ORDER — FENTANYL CITRATE (PF) 100 MCG/2ML IJ SOLN
INTRAMUSCULAR | Status: DC | PRN
  Administered 2015-05-26: 100 ug via INTRAVENOUS
  Administered 2015-05-26: 50 ug via INTRAVENOUS

## 2015-05-26 MED ORDER — LIDOCAINE HCL (CARDIAC) 20 MG/ML IV SOLN
INTRAVENOUS | Status: DC | PRN
  Administered 2015-05-26: 80 mg via INTRAVENOUS

## 2015-05-26 MED ORDER — MIDAZOLAM HCL 5 MG/5ML IJ SOLN
INTRAMUSCULAR | Status: DC | PRN
  Administered 2015-05-26: 2 mg via INTRAVENOUS

## 2015-05-26 MED ORDER — DEXTROSE 50 % IV SOLN
INTRAVENOUS | Status: AC
  Administered 2015-05-26: 25 mL
  Filled 2015-05-26: qty 50

## 2015-05-26 MED ORDER — LIDOCAINE HCL (CARDIAC) 20 MG/ML IV SOLN
INTRAVENOUS | Status: AC
  Filled 2015-05-26: qty 5

## 2015-05-26 MED ORDER — FENTANYL CITRATE (PF) 250 MCG/5ML IJ SOLN
INTRAMUSCULAR | Status: AC
  Filled 2015-05-26: qty 5

## 2015-05-26 MED ORDER — NEOSTIGMINE METHYLSULFATE 10 MG/10ML IV SOLN
INTRAVENOUS | Status: AC
  Filled 2015-05-26: qty 1

## 2015-05-26 MED ORDER — PAPAVERINE HCL 30 MG/ML IJ SOLN
INTRAMUSCULAR | Status: AC
  Filled 2015-05-26: qty 2

## 2015-05-26 MED ORDER — DEXAMETHASONE SODIUM PHOSPHATE 4 MG/ML IJ SOLN
INTRAMUSCULAR | Status: DC | PRN
  Administered 2015-05-26: 8 mg via INTRAVENOUS

## 2015-05-26 MED ORDER — PROMETHAZINE HCL 25 MG/ML IJ SOLN
INTRAMUSCULAR | Status: AC
  Filled 2015-05-26: qty 1

## 2015-05-26 MED ORDER — LIDOCAINE-EPINEPHRINE (PF) 1 %-1:200000 IJ SOLN
INTRAMUSCULAR | Status: AC
  Filled 2015-05-26: qty 30

## 2015-05-26 MED ORDER — PHENYLEPHRINE HCL 10 MG/ML IJ SOLN
INTRAMUSCULAR | Status: DC | PRN
  Administered 2015-05-26 (×5): 80 ug via INTRAVENOUS

## 2015-05-26 MED ORDER — MEPERIDINE HCL 25 MG/ML IJ SOLN: 6.2500 mg | INTRAMUSCULAR | Status: DC | PRN

## 2015-05-26 MED ORDER — PROPOFOL 10 MG/ML IV BOLUS
INTRAVENOUS | Status: AC
  Filled 2015-05-26: qty 40

## 2015-05-26 MED ORDER — PAPAVERINE HCL 30 MG/ML IJ SOLN
INTRAMUSCULAR | Status: DC | PRN
  Administered 2015-05-26: 60 mg via INTRAVENOUS

## 2015-05-26 MED ORDER — FAMOTIDINE 20 MG PO TABS: 20.0000 mg | ORAL_TABLET | Freq: Once | ORAL | Status: DC

## 2015-05-26 MED ORDER — NEOSTIGMINE METHYLSULFATE 10 MG/10ML IV SOLN
INTRAVENOUS | Status: DC | PRN
  Administered 2015-05-26: 4 mg via INTRAVENOUS

## 2015-05-26 SURGICAL SUPPLY — 32 items
CANISTER SUCTION 2500CC (MISCELLANEOUS) ×3 IMPLANT
CATH EMB 3FR 40CM (CATHETERS) ×2 IMPLANT
CLIP TI MEDIUM 6 (CLIP) ×3 IMPLANT
CLIP TI WIDE RED SMALL 6 (CLIP) ×3 IMPLANT
COVER PROBE W GEL 5X96 (DRAPES) ×3 IMPLANT
ELECT REM PT RETURN 9FT ADLT (ELECTROSURGICAL) ×3
ELECTRODE REM PT RTRN 9FT ADLT (ELECTROSURGICAL) ×1 IMPLANT
GLOVE BIO SURGEON STRL SZ 6.5 (GLOVE) ×2 IMPLANT
GLOVE BIO SURGEON STRL SZ7 (GLOVE) ×2 IMPLANT
GLOVE BIO SURGEONS STRL SZ 6.5 (GLOVE) ×2
GLOVE BIOGEL PI IND STRL 7.5 (GLOVE) ×1 IMPLANT
GLOVE BIOGEL PI INDICATOR 7.5 (GLOVE) ×2
GLOVE SURG SS PI 7.0 STRL IVOR (GLOVE) ×2 IMPLANT
GLOVE SURG SS PI 7.5 STRL IVOR (GLOVE) ×3 IMPLANT
GOWN STRL REUS W/ TWL LRG LVL3 (GOWN DISPOSABLE) ×2 IMPLANT
GOWN STRL REUS W/ TWL XL LVL3 (GOWN DISPOSABLE) ×1 IMPLANT
GOWN STRL REUS W/TWL LRG LVL3 (GOWN DISPOSABLE) ×6
GOWN STRL REUS W/TWL XL LVL3 (GOWN DISPOSABLE) ×3
HEMOSTAT SNOW SURGICEL 2X4 (HEMOSTASIS) IMPLANT
KIT BASIN OR (CUSTOM PROCEDURE TRAY) ×3 IMPLANT
KIT ROOM TURNOVER OR (KITS) ×3 IMPLANT
LIQUID BAND (GAUZE/BANDAGES/DRESSINGS) ×3 IMPLANT
NS IRRIG 1000ML POUR BTL (IV SOLUTION) ×3 IMPLANT
PACK CV ACCESS (CUSTOM PROCEDURE TRAY) ×3 IMPLANT
PAD ARMBOARD 7.5X6 YLW CONV (MISCELLANEOUS) ×6 IMPLANT
SUT PROLENE 6 0 CC (SUTURE) ×3 IMPLANT
SUT VIC AB 3-0 SH 27 (SUTURE) ×6
SUT VIC AB 3-0 SH 27X BRD (SUTURE) ×1 IMPLANT
SUT VICRYL 4-0 PS2 18IN ABS (SUTURE) ×4 IMPLANT
SYR TB 1ML LUER SLIP (SYRINGE) ×2 IMPLANT
UNDERPAD 30X30 INCONTINENT (UNDERPADS AND DIAPERS) ×3 IMPLANT
WATER STERILE IRR 1000ML POUR (IV SOLUTION) ×3 IMPLANT

## 2015-05-26 NOTE — Anesthesia Postprocedure Evaluation (Signed)
  Anesthesia Post-op Note  Patient: Robert [name]  Procedure(s) Performed: Procedure(s): SUPERFICIALIZATION OF ARTERIOVENOUS FISTULA WITH SIDE BRANCH LIGATIONS (Right)  Patient Location: PACU  Anesthesia Type:General  Level of Consciousness: awake, alert  and oriented  Airway and Oxygen Therapy: Patient Spontanous Breathing  Post-op Pain: minimal  Post-op Assessment: Post-op Vital signs reviewed and Patient's Cardiovascular Status Stable              Post-op Vital Signs: Reviewed and stable  Last Vitals:  Filed Vitals:   05/26/15 1250  BP:   Pulse: 66  Temp: 36.7 C  Resp: 21    Complications: No apparent anesthesia complications

## 2015-05-26 NOTE — Discharge Instructions (Signed)
05/26/2015 Robert [name]:[id number] 1961-01-03  Surgeon(s): Serafina Mitchell, MD  Procedure(s): SUPERFICIALIZATION OF ARTERIOVENOUS FISTULA WITH SIDE BRANCH LIGATIONS-right arm  x Do not stick fistula for 6 weeks   What to eat:  For your first meals, you should eat lightly; only small meals initially.  If you do not have nausea, you may eat larger meals.  Avoid spicy, greasy and heavy food.    General Anesthesia, Adult, Care After  Refer to this sheet in the next few weeks. These instructions provide you with information on caring for yourself after your procedure. Your health care provider may also give you more specific instructions. Your treatment has been planned according to current medical practices, but problems sometimes occur. Call your health care provider if you have any problems or questions after your procedure.  WHAT TO EXPECT AFTER THE PROCEDURE  After the procedure, it is typical to experience:  Sleepiness.  Nausea and vomiting. HOME CARE INSTRUCTIONS  For the first 24 hours after general anesthesia:  Have a responsible person with you.  Do not drive a car. If you are alone, do not take public transportation.  Do not drink alcohol.  Do not take medicine that has not been prescribed by your health care provider.  Do not sign important papers or make important decisions.  You may resume a normal diet and activities as directed by your health care provider.  Change bandages (dressings) as directed.  If you have questions or problems that seem related to general anesthesia, call the hospital and ask for the anesthetist or anesthesiologist on call. SEEK MEDICAL CARE IF:  You have nausea and vomiting that continue the day after anesthesia.  You develop a rash. SEEK IMMEDIATE MEDICAL CARE IF:  You have difficulty breathing.  You have chest pain.  You have any allergic problems. Document Released: 12/31/2000 Document Revised: 05/27/2013 Document Reviewed:  04/09/2013  [facility] Patient Information 2014 [place], [place].   Sore Throat    A sore throat is a painful, burning, sore, or scratchy feeling of the throat. There may be pain or tenderness when swallowing or talking. You may have other symptoms with a sore throat. These include coughing, sneezing, fever, or a swollen neck. A sore throat is often the first sign of another sickness. These sicknesses may include a cold, flu, strep throat, or an infection called mono. Most sore throats go away without medical treatment.  HOME CARE  Only take medicine as told by your doctor.  Drink enough fluids to keep your pee (urine) clear or pale yellow.  Rest as needed.  Try using throat sprays, lozenges, or suck on hard candy (if older than 4 years or as told).  Sip warm liquids, such as broth, herbal tea, or warm water with honey. Try sucking on frozen ice pops or drinking cold liquids.  Rinse the mouth (gargle) with salt water. Mix 1 teaspoon salt with 8 ounces of water.  Do not smoke. Avoid being around others when they are smoking.  Put a humidifier in your bedroom at night to moisten the air. You can also turn on a hot shower and sit in the bathroom for 5-10 minutes. Be sure the bathroom door is closed. GET HELP RIGHT AWAY IF:  You have trouble breathing.  You cannot swallow fluids, soft foods, or your spit (saliva).  You have more puffiness (swelling) in the throat.  Your sore throat does not get better in 7 days.  You feel sick to your  stomach (nauseous) and throw up (vomit).  You have a fever or lasting symptoms for more than 2-3 days.  You have a fever and your symptoms suddenly get worse. MAKE SURE YOU:  Understand these instructions.  Will watch your condition.  Will get help right away if you are not doing well or get worse. Document Released: 07/03/2008 Document Revised: 06/18/2012 Document Reviewed: 06/01/2012  [facility] Patient Information 2015 [place], [place]. This information is not  intended to replace advice given to you by your health care provider. Make sure you discuss any questions you have with your health care provider.

## 2015-05-26 NOTE — Anesthesia Procedure Notes (Addendum)
Procedure Name: Intubation Date/Time: 05/26/2015 9:52 AM Performed by: Talbot Grumbling Pre-anesthesia Checklist: Patient identified, Emergency Drugs available, Suction available and Patient being monitored Patient Re-evaluated:Patient Re-evaluated prior to inductionOxygen Delivery Method: Circle system utilized Preoxygenation: Pre-oxygenation with 100% oxygen Intubation Type: IV induction Ventilation: Mask ventilation without difficulty Laryngoscope Size: Mac, 4 and Glidescope Grade View: Grade I Tube type: Oral Tube size: 8.0 mm Number of attempts: 1 Airway Equipment and Method: Stylet and Video-laryngoscopy Placement Confirmation: ETT inserted through vocal cords under direct vision,  positive ETCO2 and breath sounds checked- equal and bilateral Secured at: 22 cm Tube secured with: Tape Dental Injury: Teeth and Oropharynx as per pre-operative assessment

## 2015-05-26 NOTE — H&P (Signed)
Patient name: Robert Dharia MerrittMRN: [id number] DOB: 1962-03-31Sex: male    Chief Complaint  Patient presents with  . Routine [name]    Right forearm AVF and Right chest TDC on 02-25-15. Per pt says Dr. Jimmy Footman thinks AVF is too deep. Pt is to have PD cath removed and Umbilical herniorrhaphy next week By Dr. Rush Farmer.     HISTORY OF PRESENT ILLNESS: the patient is back for follow-up. On 02/25/2015 he underwent right radiocephalic fistula and placement of a dietary catheter. He is scheduled to have his umbilical hernia repaired and peritoneal dialysis catheter removed in the next week.   The patient denies any symptoms of steal.  Past Medical History  Diagnosis Date  . Diabetes mellitus without complication   . Thyroid disease   . Hyperlipidemia   . PAT (paroxysmal atrial tachycardia)   . PSVT (paroxysmal supraventricular tachycardia)   . Blood transfusion without reported diagnosis 1974    kidney removed - L  . CPAP (continuous positive airway pressure) dependence   . Dysrhythmia   . Shortness of breath   . Hypothyroidism   . GERD (gastroesophageal reflux disease)     n/v a lot  . Headache(784.0)     slight dull h/a due kidney failure  . Anemia     low hgb at present  . Umbilical hernia     will repair with thi surgery  . Neuropathy     right hand and both feet  . Constipation   . Macular degeneration   . Eczema   . Sleep apnea 06/18/11    split-night sleep study- [facility] BiPAP- no oxygen  . Chronic kidney disease     Stage IV, Richarda Blade. M,W,F  . Cancer     skin ca right shoulder, plastic dsyplasia, pre-Ca polpys removed on Colonoscopy- 07/2014  . Hypertension     SVT h/o , followed by Dr. Claiborne Billings    Past Surgical History  Procedure Laterality Date  . Nephrectomy Left 1974  . Renal  biopsy Right 2012  . Skin cancer excision      right shoulder  . Capd insertion N/A 05/18/2014    Procedure: LAPAROSCOPIC INSERTION CONTINUOUS AMBULATORY PERITONEAL DIALYSIS (CAPD) CATHETER; Surgeon: Ralene Ok, MD; Location: [place]; Service: General; Laterality: N/A;  . Umbilical hernia repair N/A 05/18/2014    Procedure: HERNIA REPAIR UMBILICAL ADULT; Surgeon: Ralene Ok, MD; Location: [place]; Service: General; Laterality: N/A;  . Laparoscopic repositioning capd catheter N/A 06/16/2014    Procedure: LAPAROSCOPIC REPOSITIONING CAPD CATHETER; Surgeon: Ralene Ok, MD; Location: [facility]; Service: General; Laterality: N/A;  . Ij catheter insertion    . Colonoscopy    . Insertion of dialysis catheter Right 02/25/2015    Procedure: INSERTION OF RIGHT INTERNAL JUGULAR DIALYSIS CATHETER; Surgeon: Serafina Mitchell, MD; Location: [facility]; Service: Vascular; Laterality: Right;  . Av fistula placement Right 02/25/2015    Procedure: RIGHT ARTERIOVENOUS (AV) FISTULA CREATION; Surgeon: Serafina Mitchell, MD; Location: [facility]; Service: Vascular; Laterality: Right;    History   Social History  . Marital Status: Married    Spouse Name: N/A  . Number of Children: 3  . 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 Smoking status: Former Smoker    Types: Pipe    Quit date: 12/06/2013  . Smokeless tobacco: Never Used  . Alcohol Use: 0.6 oz/week    1 Glasses of wine  per week     Comment: 1 glass of wine per week, rare use as of 01/2015  . Drug Use: No  . Sexual Activity: Not on file   Other Topics Concern  . Not on file   Social History Narrative    Family History  Problem Relation Age of Onset  . Colon polyps Father   . Colon cancer Neg Hx   . Stomach cancer Neg Hx     Allergies as of  04/25/2015  . (No Known Allergies)    Current Outpatient Prescriptions on File Prior to Visit  Medication Sig Dispense Refill  . aspirin 81 MG tablet Take 81 mg by mouth every morning.     Marland Kitchen BYSTOLIC 5 MG tablet Take 5 mg by mouth daily.  3  . CALCITRIOL PO Take 1 tablet by mouth 3 (three) times a week.    . calcium acetate (PHOSLO) 667 MG capsule Take 667 mg by mouth 3 (three) times daily with meals.    . cetirizine (ZYRTEC) 10 MG tablet Take 10 mg by mouth every morning.     . docusate sodium (COLACE) 100 MG capsule Take 100 mg by mouth every evening.     . fluocinonide cream (LIDEX) [id number] % Apply 1 application topically daily as needed (Skin inflammation (hand, thigh, back)).     [name] 1000 MG chewable tablet Chew 1,000-2,000 mg by mouth 4 (four) times daily. 2000 mg with every meal and 1000 mg with snack    . gentamicin cream (GARAMYCIN) 0.1 % Apply 1 application topically daily. Apply weekly to peritoneal catheter    . glucose blood (ONETOUCH VERIO) test strip Use as instructed to check blood sugar 6 a day dx code E11.29 550 each 3  . Insulin Glargine (LANTUS SOLOSTAR) 100 UNIT/ML Solostar Pen Inject 30 Units into the skin every morning. 45 mL 1  . insulin regular human CONCENTRATED (HUMULIN R) 500 UNIT/ML injection Inject 12 units in the am and 18 units at supper 60 mL 1  . Insulin Syringe-Needle U-100 (INSULIN SYRINGE 1CC/31GX5/16") 31G X 5/16" 1 ML MISC Use to inject insulin 100 each 3  . levothyroxine (SYNTHROID, LEVOTHROID) 100 MCG tablet TAKE 1 TABLET (100 MCG TOTAL) BY MOUTH DAILY. 90 tablet 1  . nebivolol (BYSTOLIC) 10 MG tablet Take 10 mg by mouth daily.    Marland Kitchen NOVOFINE 32G X 6 MM MISC USE 4 PEN NEEDLES PER DAY TO INJECT INSULIN 400 each 1  . OVER THE COUNTER MEDICATION Take 1 tablet by mouth daily. OTC DiaVite    . polyethylene glycol (MIRALAX / GLYCOLAX) packet Take 17 g by mouth  daily.    . CRESTOR 20 MG tablet TAKE 1 TABLET (20 MG TOTAL) BY MOUTH DAILY. (Patient not taking: Reported on 04/25/2015) 90 tablet 1   No current facility-administered medications on file prior to visit.     REVIEW OF SYSTEMS: Cardiovascular: No chest pain, chest pressure, palpitations, orthopnea, or dyspnea on exertion. No claudication or rest pain, No history of DVT or phlebitis. Pulmonary: No productive cough, asthma or wheezing. Neurologic: No weakness, paresthesias, aphasia, or amaurosis. No dizziness. Hematologic: No bleeding problems or clotting disorders. Musculoskeletal: No joint pain or joint swelling. Gastrointestinal: No blood in stool or hematemesis Genitourinary: No dysuria or hematuria. Psychiatric:: No history of major depression. Integumentary: No rashes or ulcers. Constitutional: No fever or chills.  PHYSICAL EXAMINATION:  Vital signs are  Filed Vitals:   04/25/15 1312  BP: 160/90  Pulse: 72  Temp:  98.2 F (36.8 C)  TempSrc: Oral  Resp: 16  Height: 6' (1.829 m)  Weight: 317 lb (143.79 kg)  SpO2: 97%   Body mass index is 42.98 kg/(m^2). General: The patient appears their stated age. HEENT: No gross abnormalities Pulmonary: Non labored breathing Abdomen: Umbilical hernia Musculoskeletal: There are no major deformities. Neurologic: No focal weakness or paresthesias are detected, Skin: There are no ulcer or rashes noted. Psychiatric: The patient has normal affect. Cardiovascular: Palpable thrill within the right radiocephalic fistula. This decreases as you go up the arm  Diagnostic Studies I have reviewed the patient's duplex today. The fistula is patent. Diameter measurements near the anastomosis are somewhat decreased ranging from 0.39-0.45 cm. There are 2 branches in the arm. As you go Higher in the arm, the vein diameters are more appropriate in the 0.6 cm range. The fistula is also slightly  deep.  Assessment: non-maturing right radiocephalic fistula Plan: the etiology for the non-maturation is multifactorial. There is mild narrowing near the arterial venous anastomosis and proximal fistula. There are 2 branches as well below the antecubital crease. In addition the fistula is on the deep side. Therefore, I have recommended that the patient undergo elevation of his fistula and branch ligation. I will also try to pass a dilator across the arteriovenous anastomosis to improve the proximal portion of the fistula. This will be scheduled for Thursday August 18th  Eldridge Abrahams, M.D. [facility] Office: [phone] Pager: [phone]         No interval changes   [name]

## 2015-05-26 NOTE — OR Nursing (Signed)
Hypoglycemic on admission to PACU - CBG 44 CRNA notified, Dr. Smith Robert made aware. D50W 25 ml IV administered. CBG recheck after 15 minutes 82.

## 2015-05-26 NOTE — Transfer of Care (Signed)
Immediate Anesthesia Transfer of Care Note  Patient: Robert [name]  Procedure(s) Performed: Procedure(s): SUPERFICIALIZATION OF ARTERIOVENOUS FISTULA WITH SIDE BRANCH LIGATIONS (Right)  Patient Location: PACU  Anesthesia Type:General  Level of Consciousness: awake, alert , oriented and patient cooperative  Airway & Oxygen Therapy: Patient Spontanous Breathing and Patient connected to nasal cannula oxygen  Post-op Assessment: Report given to RN, Post -op Vital signs reviewed and stable, Patient moving all extremities and asking questions about his intubation, telling nurse what meds he took this morning  Post vital signs: Reviewed and stable  Last Vitals:  Filed Vitals:   05/26/15 0652  BP: 170/66  Pulse: 78  Temp: 36.9 C  Resp: 18    Complications: No apparent anesthesia complications

## 2015-05-26 NOTE — Anesthesia Preprocedure Evaluation (Addendum)
Anesthesia Evaluation  Patient identified by MRN, date of birth, ID band Patient awake    Reviewed: Allergy & Precautions, NPO status , Patient's Chart, lab work & pertinent test results, reviewed documented beta blocker date and time   History of Anesthesia Complications (+) DIFFICULT AIRWAY  Airway Mallampati: II  TM Distance: >3 FB Neck ROM: Full    Dental  (+) Teeth Intact, Dental Advisory Given   Pulmonary neg pulmonary ROS, sleep apnea and Continuous Positive Airway Pressure Ventilation , former smoker,  breath sounds clear to auscultation        Cardiovascular hypertension, Pt. on medications and Pt. on home beta blockers Rhythm:Regular Rate:Normal     Neuro/Psych negative neurological ROS  negative psych ROS   GI/Hepatic GERD-  ,  Endo/Other  diabetes, Well Controlled, Type 2, Insulin DependentHypothyroidism   Renal/GU ESRF and DialysisRenal diseaseM-W-Fr, completed only half a cycle yesterday due to a power failure  negative genitourinary   Musculoskeletal negative musculoskeletal ROS (+)   Abdominal   Peds negative pediatric ROS (+)  Hematology   Anesthesia Other Findings   Reproductive/Obstetrics negative OB ROS                          Lab Results  Component Value Date   WBC 7.1 05/13/2015   HGB 10.5* 05/26/2015   HCT 31.0* 05/26/2015   MCV 96.7 05/13/2015   PLT 327 05/13/2015   Lab Results  Component Value Date   CREATININE 10.76* 05/13/2015   BUN 37* 05/13/2015   NA 137 05/26/2015   K 5.3* 05/26/2015   CL 98* 05/13/2015   CO2 22 05/13/2015   Lab Results  Component Value Date   INR 1.16 05/11/2015   INR 1.22 06/17/2014   EKG: normal sinus rhythm.  Nuc Med Cardiac Exam: Clinical Symptoms: No significant symptoms noted. ECG Impression: No significant ST segment change suggestive of ischemia. Comparison with Prior Nuclear Study: No images to compare  Overall  Impression: Normal stress nuclear study.  LV Wall Motion: NL LV Function; NL Wall Motion  Anesthesia Physical Anesthesia Plan  ASA: III  Anesthesia Plan: General   Post-op Pain Management:    Induction: Intravenous  Airway Management Planned: Oral ETT and Video Laryngoscope Planned  Additional Equipment:   Intra-op Plan:   Post-operative Plan:   Informed Consent: I have reviewed the patients History and Physical, chart, labs and discussed the procedure including the risks, benefits and alternatives for the proposed anesthesia with the patient or authorized representative who has indicated his/her understanding and acceptance.   Dental advisory given  Plan Discussed with: CRNA  Anesthesia Plan Comments: (Plan discussed with Jahzion and wife (POA))       Anesthesia Quick Evaluation

## 2015-05-26 NOTE — Progress Notes (Signed)
  Day of Surgery Note    Subjective:  C/o numbness in his right hand.  States he had numbness in his 4th and 5th fingers prior to surgery, but now has numbness in 2nd and 3rd fingers.  Filed Vitals:   05/26/15 1213  BP: 133/69  Pulse: 66  Temp:   Resp: 17    Incisions:   C/d/i Extremities:  2-3+ palpable right radial pulse; some coolness of 5th finger (right) otherwise, warm and equal to left hand.    Assessment/Plan:  This is a 54 y.o. male who is s/p superficialization of left radial cephalic AVF now with numbness of 1st and 2nd fingers seen in PACU with Dr. Trula Slade  -intraoperatively, there was a sensory nerve wrapped around the fistula, which is most likely causing his numbness.  He does have a 2-3+ palpable right radial pulse.   -Dr. Trula Slade explained to the pt that there was a nerve wrapped around the fistula, which is most likely causing the numbness in his fingers.   -He does not think this is steal as we didn't do anything except superficialize the fistula and ligate a couple of branches.  -pt expressed understanding -he will f/u with Dr. Trula Slade in 3 weeks   Leontine Locket, PA-C 05/26/2015 12:36 PM

## 2015-05-26 NOTE — Op Note (Signed)
    Patient name: Robert [name] MRN: [id number] DOB: 09-09-1961 Sex: male  05/26/2015 Pre-operative Diagnosis: Non-maturing right radiocephalic fistula Post-operative diagnosis:  Same Surgeon:  [name] Assistants:  Leontine Locket Procedure:   Elevation and branch ligation, right radiocephalic fistula Anesthesia:  Gen. Blood Loss:  See anesthesia record Specimens:  None  Findings:  2 large branches were identified and ligated.  The vein was very reactive and went into spasm with gentle manipulation.  Indications:  The patient has undergone radiocephalic fistula creation which has not matured.  I suspect the reason for non-maturation is additional branches and the depth.  He is here today for branch ligation and elevation.  Procedure:  The patient was identified in the holding area and taken to [facility]  The patient was then placed supine on the table. general anesthesia was administered.  The patient was prepped and draped in the usual sterile fashion.  A time out was called and antibiotics were administered.  The vein was evaluated with ultrasound and found to be widely patent and easily compressible.  I made 2 incisions directly anterior to the vein in the forearm.  Through these incisions the vein was circumferentially dissected free.  There were 2 large branches that were divided with 2-0 silk ties.  I also mobilized the nerve which was branching anterior to the vein.  Once I had full exposure of the vein I soaked it in papaverine as it had gone into some spasm.  I then closed the deep tissue posterior to the vein with interrupted 30 Vicryls suture.  I felt that the vein was still in spasm and therefore I made a small venotomy with an 11 blade.  I passed a #3 Fogarty proximally and distally to get the vein out of spasm.  The Fogarty easily crossed the arterial venous anastomosis.  Once I done this the spasm improved.  The vein anatomy was closed with 6-0 proline.  There was a good  thrill within the vein.  I then closed the skin directly anterior to the fistula with running 40 Vicryls followed by Dermabond.   Disposition:  To PACU in stable condition.   Theotis Burrow, M.D. [facility] Office: [phone] Pager:  [phone]

## 2015-05-27 ENCOUNTER — Encounter (HOSPITAL_COMMUNITY): Payer: Self-pay | Admitting: Surgery

## 2015-05-31 ENCOUNTER — Ambulatory Visit (INDEPENDENT_AMBULATORY_CARE_PROVIDER_SITE_OTHER): Payer: BLUE CROSS/BLUE SHIELD | Admitting: Psychiatry

## 2015-05-31 ENCOUNTER — Telehealth: Payer: Self-pay | Admitting: Surgery

## 2015-05-31 DIAGNOSIS — F0630 Mood disorder due to known physiological condition, unspecified: Secondary | ICD-10-CM

## 2015-05-31 NOTE — Telephone Encounter (Signed)
-----   Message from Mena Goes, RN sent at 05/26/2015 11:39 AM EDT ----- Regarding: schedule   ----- Message -----    From: Gabriel Earing, PA-C    Sent: 05/26/2015  11:00 AM      To: [facility] Charge Pool  S/p superficialization of right arm radial cephalic AVF [id number].  F/u with Dr. Trula Slade in 3 weeks.  Thanks, Aldona Bar

## 2015-05-31 NOTE — Telephone Encounter (Signed)
Spoke with pt to schedule, dpm °

## 2015-06-17 ENCOUNTER — Encounter: Payer: Self-pay | Admitting: Surgery

## 2015-06-20 ENCOUNTER — Ambulatory Visit (INDEPENDENT_AMBULATORY_CARE_PROVIDER_SITE_OTHER): Payer: Self-pay | Admitting: Surgery

## 2015-06-20 ENCOUNTER — Encounter: Payer: Self-pay | Admitting: Surgery

## 2015-06-20 VITALS — BP 151/92 | HR 101 | Temp 98.6°F | Ht 72.0 in | Wt 298.0 lb

## 2015-06-20 DIAGNOSIS — N184 Chronic kidney disease, stage 4 (severe): Secondary | ICD-10-CM

## 2015-06-20 NOTE — Progress Notes (Signed)
Patient name: Robert [name] MRN: [id number] DOB: 09-21-1961 Sex: male     Chief Complaint  Patient presents with  . Routine [name]    3 wk f/u - s/p superficialization of Rt RC AVF     HISTORY OF PRESENT ILLNESS: The patient is back for follow-up.  He underwent creation of a right radiocephalic fistula which failed to adequately mature.  A fistulogram revealed a widely patent fistula.  I felt that it was too deep by ultrasound and therefore on 05/26/2015 he underwent elevation of the fistula with branch ligation.  He is back today for follow-up.  No significant steal syndrome symptoms are noted  Past Medical History  Diagnosis Date  . Diabetes mellitus without complication   . Thyroid disease   . Hyperlipidemia   . PAT (paroxysmal atrial tachycardia)   . PSVT (paroxysmal supraventricular tachycardia)   . Blood transfusion without reported diagnosis 1974    kidney removed - L  . CPAP (continuous positive airway pressure) dependence   . Dysrhythmia   . Shortness of breath   . Hypothyroidism   . GERD (gastroesophageal reflux disease)     n/v a lot  . Headache(784.0)     slight dull h/a due kidney failure  . Anemia     low hgb at present  . Umbilical hernia     will repair with thi surgery  . Neuropathy     right hand and both feet  . Constipation   . Macular degeneration   . Eczema   . Sleep apnea 06/18/11    split-night sleep study- [facility] BiPAP- no oxygen  . Chronic kidney disease     Stage IV, Richarda Blade. M,W,F  . Cancer     skin ca right shoulder, plastic dsyplasia, pre-Ca polpys removed on Colonoscopy- 07/2014  . Hypertension     SVT h/o , followed by Dr. Claiborne Billings  . SBO (small bowel obstruction)   . Difficult intubation     unsure of actual problem but it was during the January 28, 2015 procedure.    Past Surgical History  Procedure Laterality Date  . Nephrectomy Left 1974  . Renal biopsy Right 2012  . Skin cancer excision      right shoulder  . Capd insertion N/A 05/18/2014    Procedure: LAPAROSCOPIC INSERTION CONTINUOUS AMBULATORY PERITONEAL DIALYSIS  (CAPD) CATHETER;  Surgeon: Ralene Ok, MD;  Location: [facility];  Service: General;  Laterality: N/A;  . Umbilical hernia repair N/A 05/18/2014    Procedure: HERNIA REPAIR UMBILICAL ADULT;  Surgeon: Ralene Ok, MD;  Location: [facility];  Service: General;  Laterality: N/A;  . Laparoscopic repositioning capd catheter N/A 06/16/2014    Procedure: LAPAROSCOPIC REPOSITIONING CAPD CATHETER;  Surgeon: Ralene Ok, MD;  Location: [facility];  Service: General;  Laterality: N/A;  . Ij catheter insertion    . Colonoscopy    . Insertion of dialysis catheter Right 02/25/2015    Procedure: INSERTION OF RIGHT INTERNAL JUGULAR DIALYSIS CATHETER;  Surgeon: Serafina Mitchell, MD;  Location: [facility];  Service: Vascular;  Laterality: Right;  . Av fistula placement Right 02/25/2015    Procedure: RIGHT ARTERIOVENOUS (AV) FISTULA CREATION;  Surgeon: Serafina Mitchell, MD;  Location: [facility];  Service: Vascular;  Laterality: Right;  . Umbilical hernia repair N/A 04/28/2015    Procedure: UMBILICAL HERNIA REPAIR WITH MESH;  Surgeon: Coralie Keens, MD;  Location: [facility];  Service: General;  Laterality: N/A;  .  Minor removal of peritoneal dialysis catheter N/A 04/28/2015    Procedure:  REMOVAL OF PERITONEAL DIALYSIS CATHETER;  Surgeon: Coralie Keens, MD;  Location: [facility];  Service: General;  Laterality: N/A;  . Laparoscopy N/A 05/04/2015    Procedure: LAPAROSCOPY DIAGNOSTIC LYSIS OF ADHESIONS;  Surgeon: Coralie Keens, MD;  Location: [facility];  Service: General;  Laterality: N/A;  . Revison of arteriovenous fistula Right 05/26/2015    Procedure: SUPERFICIALIZATION OF ARTERIOVENOUS FISTULA WITH SIDE BRANCH LIGATIONS;  Surgeon: Serafina Mitchell, MD;  [facility];  Service: Vascular;  Laterality: Right;    Social History   Social History  . Marital Status: Married    Spouse Name: N/A  . Number of  Children: 3  . 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 Smoking status: Former Smoker    Types: Pipe    Quit date: 12/06/2013  . Smokeless tobacco: Never Used  . Alcohol Use: 0.6 oz/week    1 Glasses of wine per week     Comment: 1 glass of wine per week, rare use as of 05/25/15  . Drug Use: No  . Sexual Activity: Not on file   Other Topics Concern  . Not on file   Social History Narrative    Family History  Problem Relation Age of Onset  . Colon polyps Father   . Colon cancer Neg Hx   . Stomach cancer Neg Hx     Allergies as of 06/20/2015  . (No Known Allergies)    Current Outpatient Prescriptions on File Prior to Visit  Medication Sig Dispense Refill  . aspirin 81 MG tablet Take 81 mg by mouth every morning.     Marland Kitchen CALCITRIOL PO Take 75 mg by mouth 3 (three) times a week. Monday, Wednesday and Friday.    . calcium acetate (PHOSLO) 667 MG capsule Take 667 mg by mouth 3 (three) times daily with meals.    . cetirizine (ZYRTEC) 10 MG tablet Take 10 mg by mouth every morning.     . docusate sodium (COLACE) 100 MG capsule Take 100 mg by mouth daily.     . fluocinonide cream (LIDEX) [id number] % Apply 1 application topically daily as needed (Skin inflammation (hand, thigh, back)).     [name] 1000 MG chewable tablet Chew 1,000-2,000 mg by mouth 4 (four) times daily. 2000 mg with every meal and 1000 mg with snack    . glucose blood (ONETOUCH VERIO) test strip Use as instructed to check blood sugar 6 a day dx code E11.29 550 each 3  . Insulin Glargine (LANTUS SOLOSTAR) 100 UNIT/ML Solostar Pen Inject 15 Units into the skin every morning. (Patient taking differently: Inject 30 Units into the skin daily. In the morning) 15 mL 0  . insulin regular human CONCENTRATED (HUMULIN R) 500 UNIT/ML injection Inject 6 units in the am and 8 units at supper, can slowly adjust back to prior regimen when Oral intake more reliable and Blood sugars  tolerate (Patient taking differently: Inject 5-15 Units into the skin See admin instructions. Inject 12 units in the am and 15 units at supper and 5 units with snack.)    . Insulin Syringe-Needle U-100 (INSULIN SYRINGE 1CC/31GX5/16") 31G X 5/16" 1 ML MISC Use to inject insulin 100 each 3  . levothyroxine (SYNTHROID, LEVOTHROID) 100 MCG tablet TAKE 1 TABLET (100 MCG TOTAL) BY MOUTH DAILY. 90 tablet 1  . NOVOFINE 32G X 6  MM MISC USE 4 PEN NEEDLES PER DAY TO INJECT INSULIN 400 each 1  . OVER THE COUNTER MEDICATION Take 1 tablet by mouth daily. OTC DiaVite    . polyethylene glycol (MIRALAX / GLYCOLAX) packet Take 17 g by mouth daily.    Marland Kitchen senna (SENOKOT) 8.6 MG tablet Take 1 tablet by mouth daily.    Marland Kitchen BYSTOLIC 5 MG tablet Take 5 mg by mouth every evening. Take with 10mg  tablet for a total dose of 15mg .  3  . nebivolol (BYSTOLIC) 10 MG tablet Take 10 mg by mouth every evening. Take with 5mg  tablet for a total dose of 15mg .    . oxyCODONE (ROXICODONE) 5 MG immediate release tablet Take 1 tablet (5 mg total) by mouth every 6 (six) hours as needed. (Patient not taking: Reported on 06/20/2015) 20 tablet 0   No current facility-administered medications on file prior to visit.       PHYSICAL EXAMINATION:   Vital signs are  Filed Vitals:   06/20/15 1356 06/20/15 1401  BP: 148/96 151/92  Pulse: 101   Temp: 98.6 F (37 C)   TempSrc: Oral   Height: 6' (1.829 m)   Weight: 298 lb (135.172 kg)   SpO2: 96%    Body mass index is 40.41 kg/(m^2). General: The patient appears their stated age. HEENT:  No gross abnormalities Pulmonary:  Non labored breathing Musculoskeletal: There are no major deformities. Neurologic: No focal weakness or paresthesias are detected, Skin: There are no ulcer or rashes noted. Psychiatric: The patient has normal affect. Cardiovascular: Excellent thrill within right radiocephalic fistula.  Incisions are healing nicely.   Diagnostic Studies None  Assessment: End-stage  renal disease Plan: There is an excellent thrill within the fistula.  This will be ready for use 6 weeks from his operation which will be September 29.  He'll call me with any concerns or questions  V. Leia Alf, M.D. [facility] Office: [phone] Pager:  [phone]

## 2015-06-23 LAB — HEMOGLOBIN A1C: Hgb A1c MFr Bld: 5.4 % (ref 4.0–6.0)

## 2015-07-14 ENCOUNTER — Ambulatory Visit (INDEPENDENT_AMBULATORY_CARE_PROVIDER_SITE_OTHER): Payer: BLUE CROSS/BLUE SHIELD | Admitting: Endocrinology

## 2015-07-14 ENCOUNTER — Encounter: Payer: Self-pay | Admitting: Endocrinology

## 2015-07-14 VITALS — BP 118/72 | HR 112 | Temp 98.2°F | Resp 16 | Ht 72.0 in | Wt 300.0 lb

## 2015-07-14 DIAGNOSIS — N186 End stage renal disease: Secondary | ICD-10-CM

## 2015-07-14 DIAGNOSIS — E1165 Type 2 diabetes mellitus with hyperglycemia: Secondary | ICD-10-CM | POA: Diagnosis not present

## 2015-07-14 DIAGNOSIS — E039 Hypothyroidism, unspecified: Secondary | ICD-10-CM

## 2015-07-14 DIAGNOSIS — Z794 Long term (current) use of insulin: Secondary | ICD-10-CM

## 2015-07-14 DIAGNOSIS — IMO0002 Reserved for concepts with insufficient information to code with codable children: Secondary | ICD-10-CM

## 2015-07-14 DIAGNOSIS — E1122 Type 2 diabetes mellitus with diabetic chronic kidney disease: Secondary | ICD-10-CM | POA: Diagnosis not present

## 2015-07-14 DIAGNOSIS — E291 Testicular hypofunction: Secondary | ICD-10-CM | POA: Diagnosis not present

## 2015-07-14 DIAGNOSIS — Z992 Dependence on renal dialysis: Secondary | ICD-10-CM

## 2015-07-14 HISTORY — DX: Long term (current) use of insulin: E11.65

## 2015-07-14 NOTE — Patient Instructions (Signed)
Try taking U-500 insulin 30 min before meals  Check blood sugars on waking up .. 3 .. times a week Also check blood sugars about 2 hours after a meal and do this after different meals by rotation  Recommended blood sugar levels on waking up is 90-130 and about 2 hours after meal is 140-180 Please bring blood sugar monitor to each visit.

## 2015-07-14 NOTE — Progress Notes (Signed)
Patient ID: Robert [name], male   DOB: Oct 05, 1961, 54 y.o.   MRN: [id number]   Reason for Appointment: Type II Diabetes follow-up   History of Present Illness   Diagnosis date:  2012  Previous history: His blood sugar has been difficult to control since onset. Initially was taking oral hypoglycemic drugs like glyburide but could not take metformin because of renal dysfunction. Since Victoza did not help his control he was started on Lantus and then U 500 insulin also His A1c has previously ranged from 7.8-9.8, the lowest level in 03/2013 He was on Victoza but because of his markedly decreased appetite and weight loss this was stopped in 9/15  Recent history:   Insulin regimen: LANTUS 30 units In a.m.. U-500 insulin: 12-15 units, 30 min ac breakfast and 10 units at lunch, 5-15 units acs  He has not been seen in follow-up since 5/16 when he had started hemodialysis His A1c is usually lower than expected for his blood sugars, recently done at [facility] He has not changed his insulin regimen since his last visit even though he was recommended less Lantus than a higher dose of suppertime U-500 insulin  Current blood sugar patterns and problems identified:  Fasting blood sugars are fairly good although he is generally checking these infrequently and most of his early morning readings are about 4 AM  Blood sugars are trending to be progressively higher in the afternoon and evening  He takes his evening suppertime coverage only about 10 minutes before his meals since he is just finishing up dialysis  Most of the sugars after supper are high  He says that his appetite is variable in the evenings and he will adjust his insulin based on what he is eating, sometimes will take only 5 units if eating a salad  He has had recently one episode of low blood sugar at about 2 AM even though his post supper blood sugar was 230 the night before   He has lost weight  Proper timing of  medications in relation to meals: Yes.          Monitors blood glucose: Once a day or less.    Glucometer: One Touch.          Blood Glucose readings  Mean values apply above for all meters except median for One Touch  PRE-MEAL  7 AM  Lunch Dinner  overnight  Overall  Glucose range:  112-138     50-171    Mean/median:      164    POST-MEAL PC Breakfast PC Lunch PC Dinner  Glucose range:   88-199   116-298   Mean/median:   183   270      Meals: 3 meals per day. 8 am and Noon and Dinner 8-9 PM .  Still on the low phosphorus diet and has fewer carbohydrates        Physical activity: exercise: some walking           Weight control:  Wt Readings from Last 3 Encounters:  07/14/15 300 lb (136.079 kg)  06/20/15 298 lb (135.172 kg)  05/26/15 287 lb (130.182 kg)          Complications:   nephropathy, neuropathy   Diabetes labs:   Lab Results  Component Value Date   HGBA1C 5.4 06/23/2015   HGBA1C 5.9 05/02/2015   HGBA1C 6.0* 04/22/2015   Lab Results  Component Value Date   MICROALBUR 224.5 Repeated and verified X2.*  01/04/2014   LDLCALC 34 09/20/2014   CREATININE 10.76* 05/13/2015       Medication List       This list is accurate as of: 07/14/15 12:01 PM.  Always use your most recent med list.               aspirin 81 MG tablet  Take 81 mg by mouth every morning.     nebivolol 10 MG tablet  Commonly known as:  BYSTOLIC  Take 10 mg by mouth every evening. Take with 5mg  tablet for a total dose of 15mg .     BYSTOLIC 5 MG tablet  Generic drug:  nebivolol  Take 5 mg by mouth every evening. Take with 10mg  tablet for a total dose of 15mg .     CALCITRIOL PO  Take 75 mg by mouth 3 (three) times a week. Monday, Wednesday and Friday.     calcium acetate 667 MG capsule  Commonly known as:  PHOSLO  Take 667 mg by mouth 3 (three) times daily with meals.     cetirizine 10 MG tablet  Commonly known as:  ZYRTEC  Take 10 mg by mouth every morning.     DIALYTE/1.5%  DEXTROSE IP  Inject into the peritoneum.     docusate sodium 100 MG capsule  Commonly known as:  COLACE  Take 100 mg by mouth daily.     fluocinonide cream 0.05 %  Commonly known as:  LIDEX  Apply 1 application topically daily as needed (Skin inflammation (hand, thigh, back)).     FOSRENOL 1000 MG chewable tablet  Generic drug:  lanthanum  Chew 1,000-2,000 mg by mouth 4 (four) times daily. 2000 mg with every meal and 1000 mg with snack     glucose blood test strip  Commonly known as:  ONETOUCH VERIO  Use as instructed to check blood sugar 6 a day dx code E11.29     Insulin Glargine 100 UNIT/ML Solostar Pen  Commonly known as:  LANTUS SOLOSTAR  Inject 15 Units into the skin every morning.     insulin regular human CONCENTRATED 500 UNIT/ML injection  Commonly known as:  HUMULIN R  Inject 6 units in the am and 8 units at supper, can slowly adjust back to prior regimen when Oral intake more reliable and Blood sugars tolerate     INSULIN SYRINGE 1CC/31GX5/16" 31G X 5/16" 1 ML Misc  Use to inject insulin     levothyroxine 100 MCG tablet  Commonly known as:  SYNTHROID, LEVOTHROID  TAKE 1 TABLET (100 MCG TOTAL) BY MOUTH DAILY.     NOVOFINE 32G X 6 MM Misc  Generic drug:  Insulin Pen Needle  USE 4 PEN NEEDLES PER DAY TO INJECT INSULIN     OVER THE COUNTER MEDICATION  Take 1 tablet by mouth daily. OTC DiaVite     oxyCODONE 5 MG immediate release tablet  Commonly known as:  ROXICODONE  Take 1 tablet (5 mg total) by mouth every 6 (six) hours as needed.     polyethylene glycol packet  Commonly known as:  MIRALAX / GLYCOLAX  Take 17 g by mouth daily.     senna 8.6 MG tablet  Commonly known as:  SENOKOT  Take 1 tablet by mouth daily.        Allergies: No Known Allergies  Past Medical History  Diagnosis Date  . Diabetes mellitus without complication ([place])   . Thyroid disease   . Hyperlipidemia   . PAT (paroxysmal atrial tachycardia) (  [place])   . PSVT (paroxysmal  supraventricular tachycardia) ([place])   . Blood transfusion without reported diagnosis 1974    kidney removed - L  . CPAP (continuous positive airway pressure) dependence   . Dysrhythmia   . Shortness of breath   . Hypothyroidism   . GERD (gastroesophageal reflux disease)     n/v a lot  . Headache(784.0)     slight dull h/a due kidney failure  . Anemia     low hgb at present  . Umbilical hernia     will repair with thi surgery  . Neuropathy (H[facility])     right hand and both feet  . Constipation   . Macular degeneration   . Eczema   . Sleep apnea 06/18/11    split-night sleep study- [facility] BiPAP- no oxygen  . Chronic kidney disease     Stage IV, Richarda Blade. M,W,F  . Cancer ([place])     skin ca right shoulder, plastic dsyplasia, pre-Ca polpys removed on Colonoscopy- 07/2014  . Hypertension     SVT h/o , followed by Dr. Claiborne Billings  . SBO (small bowel obstruction) ([place])   . Difficult intubation     unsure of actual problem but it was during the January 28, 2015 procedure.    Past Surgical History  Procedure Laterality Date  . Nephrectomy Left 1974  . Renal biopsy Right 2012  . Skin cancer excision      right shoulder  . Capd insertion N/A 05/18/2014    Procedure: LAPAROSCOPIC INSERTION CONTINUOUS AMBULATORY PERITONEAL DIALYSIS  (CAPD) CATHETER;  Surgeon: Ralene Ok, MD;  Location: [place];  Service: General;  Laterality: N/A;  . Umbilical hernia repair N/A 05/18/2014    Procedure: HERNIA REPAIR UMBILICAL ADULT;  Surgeon: Ralene Ok, MD;  Location: [place];  Service: General;  Laterality: N/A;  . Laparoscopic repositioning capd catheter N/A 06/16/2014    Procedure: LAPAROSCOPIC REPOSITIONING CAPD CATHETER;  Surgeon: Ralene Ok, MD;  Location: [place];  Service: General;  Laterality: N/A;  . Ij catheter insertion    . Colonoscopy    . Insertion of dialysis catheter Right 02/25/2015    Procedure: INSERTION OF RIGHT INTERNAL JUGULAR DIALYSIS CATHETER;  Surgeon:  Serafina Mitchell, MD;  Location: [place];  Service: Vascular;  Laterality: Right;  . Av fistula placement Right 02/25/2015    Procedure: RIGHT ARTERIOVENOUS (AV) FISTULA CREATION;  Surgeon: Serafina Mitchell, MD;  Location: [place] [facility];  Service: Vascular;  Laterality: Right;  . Umbilical hernia repair N/A 04/28/2015    Procedure: UMBILICAL HERNIA REPAIR WITH MESH;  Surgeon: Coralie Keens, MD;  Location: [place];  Service: General;  Laterality: N/A;  . Minor removal of peritoneal dialysis catheter N/A 04/28/2015    Procedure:  REMOVAL OF PERITONEAL DIALYSIS CATHETER;  Surgeon: Coralie Keens, MD;  Location: [place];  Service: General;  Laterality: N/A;  . Laparoscopy N/A 05/04/2015    Procedure: LAPAROSCOPY DIAGNOSTIC LYSIS OF ADHESIONS;  Surgeon: Coralie Keens, MD;  Location: [place];  Service: General;  Laterality: N/A;  . Revison of arteriovenous fistula Right 05/26/2015    Procedure: SUPERFICIALIZATION OF ARTERIOVENOUS FISTULA WITH SIDE BRANCH LIGATIONS;  Surgeon: Serafina Mitchell, MD;  Location: [facility];  Service: Vascular;  Laterality: Right;    Family History  Problem Relation Age of Onset  . Colon polyps Father   . Colon cancer Neg Hx   . Stomach cancer Neg Hx     Social History:  reports that he  quit smoking about 19 months ago. His smoking use included Pipe. He has never used smokeless tobacco. He reports that he drinks about 0.6 oz of alcohol per week. He reports that he does not use illicit drugs.  Review of Systems:  Hypertension:  BP is now controlled  His blood pressure has been managed by his cardiologist   Lipids: He has much better levels recently Was switched from Lipitor to Crestor because of relatively high LDL levels but LDL has been trending lower.  Now taking Crestor qod    Lab Results  Component Value Date   CHOL 87 09/20/2014   CHOL 113 08/09/2014   CHOL 97 03/31/2014   Lab Results  Component Value Date   HDL 36.20* 09/20/2014   HDL 40 08/09/2014   HDL 32.60*  03/31/2014   Lab Results  Component Value Date   LDLCALC 34 09/20/2014   LDLCALC 55 08/09/2014   LDLCALC 20 03/31/2014   Lab Results  Component Value Date   TRIG 83.0 09/20/2014   TRIG 89 08/09/2014   TRIG 221.0* 03/31/2014   Lab Results  Component Value Date   CHOLHDL 2 09/20/2014   CHOLHDL 2.8 08/09/2014   CHOLHDL 3 03/31/2014   No results found for: LDLDIRECT   Thyroid: He has had long-standing hypothyroidism   TSH is normal. Does complain of feeling tired   Lab Results  Component Value Date   TSH 1.83 05/02/2015    He is considering  renal transplant and is going to [facility] for this    HYPOGONADISM: 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 metabolic syndrome and had subjectively improved with supplementation using Fortesta He has been off testosterone for several months and free testosterone was normal on the last evaluation  Diabetic neuropathy: Has mild sensory loss on his last exam   Examination:   BP 118/72 mmHg  Pulse 112  Temp(Src) 98.2 F (36.8 C)  Resp 16  Ht 6' (1.829 m)  Wt 300 lb (136.079 kg)  BMI 40.68 kg/m2  SpO2 98%  Body mass index is 40.68 kg/(m^2).    ASSESSMENT/ PLAN:    Diabetes type 2 with obesity:  See history of present illness for detailed discussion of his current management, problems identified and sugar patterns lately His blood sugars have been somewhat variable but overall reasonably controlled Blood sugars are difficult to control after meals especially supper Readings after supper are usually higher if he is has more carbohydrate and he cannot usually take his insulin 30 minutes or more before eating when he goes for dialysis in the late afternoon He is however interested in trying the pen for the U-500 insulin Since his fasting readings are fairly good will continue the same dose of Lantus   We will check his fructosamine also on the next visit to get a better idea of his overall control He will  continue the same regimen for now of his mealtime coverage but try to take evening insulin 30 minutes before eating and also adjust the dose further if he has consistently high evening readings  HYPOTHYROIDISM: We will need follow-up TSH on the next visit   Patient Instructions  Try taking U-500 insulin 30 min before meals  Check blood sugars on waking up .. 3 .. times a week Also check blood sugars about 2 hours after a meal and do this after different meals by rotation  Recommended blood sugar levels on waking up is 90-130 and about 2 hours after meal is  140-180 Please bring blood sugar monitor to each visit.    Counseling time on subjects discussed above is over 50% of today's 25 minute visit   Marcellas Marchant 07/14/2015, 12:01 PM

## 2015-07-26 ENCOUNTER — Telehealth: Payer: Self-pay | Admitting: Endocrinology

## 2015-07-26 NOTE — Telephone Encounter (Signed)
Patient that his pharmacy haven't received his medication Humulin R Quick pen [facility] [address], please advise

## 2015-07-27 ENCOUNTER — Other Ambulatory Visit: Payer: Self-pay | Admitting: *Deleted

## 2015-07-27 MED ORDER — INSULIN REGULAR HUMAN (CONC) 500 UNIT/ML ~~LOC~~ SOPN: PEN_INJECTOR | SUBCUTANEOUS | Status: DC

## 2015-07-27 NOTE — Telephone Encounter (Signed)
rx has been sent for the pens

## 2015-07-28 ENCOUNTER — Other Ambulatory Visit: Payer: Self-pay | Admitting: Endocrinology

## 2015-08-01 ENCOUNTER — Ambulatory Visit (INDEPENDENT_AMBULATORY_CARE_PROVIDER_SITE_OTHER): Payer: Medicare Other | Admitting: Podiatry

## 2015-08-01 ENCOUNTER — Encounter: Payer: Self-pay | Admitting: Podiatry

## 2015-08-01 DIAGNOSIS — M79676 Pain in unspecified toe(s): Secondary | ICD-10-CM | POA: Diagnosis not present

## 2015-08-01 DIAGNOSIS — E1149 Type 2 diabetes mellitus with other diabetic neurological complication: Secondary | ICD-10-CM | POA: Diagnosis not present

## 2015-08-01 DIAGNOSIS — B351 Tinea unguium: Secondary | ICD-10-CM

## 2015-08-01 LAB — HM DIABETES FOOT EXAM

## 2015-08-02 NOTE — Progress Notes (Signed)
Patient ID: Robert [name], male   DOB: Nov 19, 1960, 54 y.o.   MRN: [id number]  Subjective: 54 y.o. returns the office today for painful, elongated, thickened toenails which he is unable to trim himself. Denies any redness or drainage around the nails. Denies any acute changes since last appointment and no new complaints today. Denies any systemic complaints such as fevers, chills, nausea, vomiting. He states he tries to cut his own toenails between visits.   Objective: AAO 3, NAD DP/PT pulses palpable, CRT less than 3 seconds Protective sensation decreased with Simms Weinstein monofilament Nails hypertrophic, dystrophic, elongated, brittle, discolored 10. There is tenderness overlying the nails 1-5 bilaterally. There is no surrounding erythema or drainage along the nail sites. No open lesions or pre-ulcerative lesions are identified. No other areas of tenderness bilateral lower extremities. No overlying edema, erythema, increased warmth. No pain with calf compression, swelling, warmth, erythema.  Assessment: Patient presents with symptomatic onychomycosis  Plan: -Treatment options including alternatives, risks, complications were discussed -Nails sharply debrided 10 without complication/bleeding. -Discussed daily foot inspection. If there are any changes, to call the office immediately. Recommended to not cut his own nails.  -Follow-up in 9 weeks or sooner if any problems are to arise. In the meantime, encouraged to call the office with any questions, concerns, changes symptoms.  Celesta Gentile, DPM

## 2015-08-09 HISTORY — PX: OTHER SURGICAL HISTORY: SHX169

## 2015-08-24 ENCOUNTER — Telehealth (HOSPITAL_COMMUNITY): Payer: Self-pay | Admitting: *Deleted

## 2015-09-09 ENCOUNTER — Encounter: Payer: Self-pay | Admitting: Vascular Surgery

## 2015-09-09 ENCOUNTER — Telehealth: Payer: Self-pay | Admitting: Surgery

## 2015-09-09 NOTE — Telephone Encounter (Signed)
-----   Message from Denman George, RN sent at 09/09/2015  2:38 PM EST ----- Regarding: RE: ? about scheduling I checked and pt. had both tests done 02/18/15; per Dr. Bridgett Larsson, they don't need to be repeated at this time. Thanks for checking.    ----- Message -----    From: Gena Fray    Sent: 09/09/2015  12:54 PM      To: [facility] Subject: ? about scheduling                             Robert [name] called today to state that he has an appointment for evaluation for new access. The appointment notes state "possible AVG). He is not scheduled for an ultrasound, but Dr Augustin Coupe said he would most likely need one (per pt). Should I schedule him for an UE vein map and UE arterial as well?  Thanks Hinton Dyer

## 2015-09-12 ENCOUNTER — Encounter: Payer: Self-pay | Admitting: Surgery

## 2015-09-12 ENCOUNTER — Ambulatory Visit (INDEPENDENT_AMBULATORY_CARE_PROVIDER_SITE_OTHER): Payer: BLUE CROSS/BLUE SHIELD | Admitting: Surgery

## 2015-09-12 ENCOUNTER — Other Ambulatory Visit: Payer: Self-pay

## 2015-09-12 VITALS — BP 163/86 | HR 88 | Temp 98.5°F | Resp 16 | Ht 73.0 in | Wt 310.0 lb

## 2015-09-12 DIAGNOSIS — N186 End stage renal disease: Secondary | ICD-10-CM | POA: Diagnosis not present

## 2015-09-12 NOTE — Progress Notes (Signed)
Patient name: Robert [name] MRN: [id number] DOB: 1961/03/20 Sex: male     Chief Complaint  Patient presents with  . New Evaluation    Ref. by Dr. Augustin Coupe  C/O  New possible AVG access.  HD:  Mon-Wed-Fri.  Pt is left handed    HISTORY OF PRESENT ILLNESS:  The patient is back for follow-up.  On 02/25/2015 he underwent creation of a right radiocephalic fistula.  This required elevation and branch ligation on 05/26/2015.  He recently occluded his fistula and underwent from a lysis at the [facility] Center. He did not have a good long-term result.  It was felt that there was too much disease within the veins to continue fistula preservation and therefore a catheter was placed and he is reerred back today for new access.  Past Medical History  Diagnosis Date  . Diabetes mellitus without complication ([place])   . Thyroid disease   . Hyperlipidemia   . PAT (paroxysmal atrial tachycardia) ([place])   . PSVT (paroxysmal supraventricular tachycardia) ([place])   . Blood transfusion without reported diagnosis 1974    kidney removed - L  . CPAP (continuous positive airway pressure) dependence   . Dysrhythmia   . Shortness of breath   . Hypothyroidism   . GERD (gastroesophageal reflux disease)     n/v a lot  . Headache(784.0)     slight dull h/a due kidney failure  . Anemia     low hgb at present  . Umbilical hernia     will repair with thi surgery  . Neuropathy ([facility])     right hand and both feet  . Constipation   . Macular degeneration   . Eczema   . Sleep apnea 06/18/11    split-night sleep study- [facility]., BiPAP- no oxygen  . Chronic kidney disease     Stage IV, Richarda Blade. M,W,F  . Cancer ([place])     skin ca right shoulder, plastic dsyplasia, pre-Ca polpys removed on Colonoscopy- 07/2014  . Hypertension     SVT h/o , followed by Dr. Claiborne Billings  . SBO (small bowel obstruction) ([place])   . Difficult intubation     unsure of actual problem but it was during the January 28, 2015  procedure.    Past Surgical History  Procedure Laterality Date  . Nephrectomy Left 1974  . Renal biopsy Right 2012  . Skin cancer excision      right shoulder  . Capd insertion N/A 05/18/2014    Procedure: LAPAROSCOPIC INSERTION CONTINUOUS AMBULATORY PERITONEAL DIALYSIS  (CAPD) CATHETER;  Surgeon: Ralene Ok, MD;  Location: [facility];  Service: General;  Laterality: N/A;  . Umbilical hernia repair N/A 05/18/2014    Procedure: HERNIA REPAIR UMBILICAL ADULT;  Surgeon: Ralene Ok, MD;  Location: [facility];  Service: General;  Laterality: N/A;  . Laparoscopic repositioning capd catheter N/A 06/16/2014    Procedure: LAPAROSCOPIC REPOSITIONING CAPD CATHETER;  Surgeon: Ralene Ok, MD;  Location: [facility];  Service: General;  Laterality: N/A;  . Ij catheter insertion    . Colonoscopy    . Insertion of dialysis catheter Right 02/25/2015    Procedure: INSERTION OF RIGHT INTERNAL JUGULAR DIALYSIS CATHETER;  Surgeon: Serafina Mitchell, MD;  Location: [facility];  Service: Vascular;  Laterality: Right;  . Av fistula placement Right 02/25/2015    Procedure: RIGHT ARTERIOVENOUS (AV) FISTULA CREATION;  Surgeon: Serafina Mitchell, MD;  Location: [facility];  Service: Vascular;  Laterality: Right;  .  Umbilical hernia repair N/A 04/28/2015    Procedure: UMBILICAL HERNIA REPAIR WITH MESH;  Surgeon: Coralie Keens, MD;  Location: [facility];  Service: General;  Laterality: N/A;  . Minor removal of peritoneal dialysis catheter N/A 04/28/2015    Procedure:  REMOVAL OF PERITONEAL DIALYSIS CATHETER;  Surgeon: Coralie Keens, MD;  Location: [facility];  Service: General;  Laterality: N/A;  . Laparoscopy N/A 05/04/2015    Procedure: LAPAROSCOPY DIAGNOSTIC LYSIS OF ADHESIONS;  Surgeon: Coralie Keens, MD;  Location: [facility];  Service: General;  Laterality: N/A;  . Revison of arteriovenous fistula Right 05/26/2015    Procedure: SUPERFICIALIZATION OF ARTERIOVENOUS FISTULA WITH SIDE BRANCH LIGATIONS;  Surgeon: Serafina Mitchell, MD;  [facility];  Service: Vascular;  Laterality: Right;    Social History   Social History  . Marital Status: Married    Spouse Name: N/A  . Number of Children: 3  . 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 Smoking status: Former Smoker    Types: Pipe    Quit date: 12/06/2013  . Smokeless tobacco: Never Used  . Alcohol Use: 0.6 oz/week    1 Glasses of wine per week     Comment: 1 glass of wine per week, rare use as of 05/25/15  . Drug Use: No  . Sexual Activity: Not on file   Other Topics Concern  . Not on file   Social History Narrative    Family History  Problem Relation Age of Onset  . Colon polyps Father   . Colon cancer Neg Hx   . Stomach cancer Neg Hx     Allergies as of 09/12/2015  . (No Known Allergies)    Current Outpatient Prescriptions on File Prior to Visit  Medication Sig Dispense Refill  . aspirin 81 MG tablet Take 81 mg by mouth every morning.     Marland Kitchen CALCITRIOL PO Take 75 mg by mouth 3 (three) times a week. Monday, Wednesday and Friday.    . calcium acetate (PHOSLO) 667 MG capsule Take 667 mg by mouth 3 (three) times daily with meals.    . cetirizine (ZYRTEC) 10 MG tablet Take 10 mg by mouth every morning.     . docusate sodium (COLACE) 100 MG capsule Take 100 mg by mouth daily.     . fluocinonide cream (LIDEX) [id number] % Apply 1 application topically daily as needed (Skin inflammation (hand, thigh, back)).     [name] 1000 MG chewable tablet Chew 1,000-2,000 mg by mouth 4 (four) times daily. 2000 mg with every meal and 1000 mg with snack    . glucose blood (ONETOUCH VERIO) test strip Use as instructed to check blood sugar 6 a day dx code E11.29 550 each 3  . Insulin Glargine (LANTUS SOLOSTAR) 100 UNIT/ML Solostar Pen Inject 15 Units into the skin every morning. (Patient taking differently: Inject 30 Units into the skin daily. In the morning) 15 mL 0  . insulin regular human CONCENTRATED (HUMULIN) 500 UNIT/ML  injection Inject 6 units in the morning and 8 units in the pm 2 pen 3  . Insulin Syringe-Needle U-100 (INSULIN SYRINGE 1CC/31GX5/16") 31G X 5/16" 1 ML MISC Use to inject insulin 100 each 3  . levothyroxine (SYNTHROID, LEVOTHROID) 100 MCG tablet TAKE 1 TABLET BY MOUTH EVERY DAY 90 tablet 1  . NOVOFINE 32G X 6 MM MISC USE 4 PEN NEEDLES PER DAY TO INJECT INSULIN 400 each 1  .  polyethylene glycol (MIRALAX / GLYCOLAX) packet Take 17 g by mouth daily.    Marland Kitchen senna (SENOKOT) 8.6 MG tablet Take 1 tablet by mouth daily.    Marland Kitchen BYSTOLIC 5 MG tablet Take 5 mg by mouth every evening. Take with 10mg  tablet for a total dose of 15mg .  3  . nebivolol (BYSTOLIC) 10 MG tablet Take 10 mg by mouth every evening. Take with 5mg  tablet for a total dose of 15mg .    . OVER THE COUNTER MEDICATION Take 1 tablet by mouth daily. OTC DiaVite    . oxyCODONE (ROXICODONE) 5 MG immediate release tablet Take 1 tablet (5 mg total) by mouth every 6 (six) hours as needed. (Patient not taking: Reported on 09/12/2015) 20 tablet 0  . Peritoneal Dialysis Solutions (DIALYTE/1.5% DEXTROSE IP) Inject into the peritoneum.     No current facility-administered medications on file prior to visit.     REVIEW OF SYSTEMS: Cardiovascular: No chest pain, chest pressure, palpitations, orthopnea, or dyspnea on exertion. No claudication or rest pain,  No history of DVT or phlebitis. Pulmonary: No productive cough, asthma or wheezing. Neurologic: No weakness, paresthesias, aphasia, or amaurosis. No dizziness. Hematologic: No bleeding problems or clotting disorders. Musculoskeletal: No joint pain or joint swelling. Gastrointestinal: No blood in stool or hematemesis Genitourinary: No dysuria or hematuria. Psychiatric:: No history of major depression. Integumentary: No rashes or ulcers. Constitutional: No fever or chills.  PHYSICAL EXAMINATION:   Vital signs are  Filed Vitals:   09/12/15 1207 09/12/15 1214  BP: 158/98 163/86  Pulse: 89 88  Temp:  98.5 F (36.9 C)   TempSrc: Oral   Resp: 16   Height: 6\' 1"  (1.854 m)   Weight: 310 lb (140.615 kg)   SpO2: 98%    Body mass index is 40.91 kg/(m^2). General: The patient appears their stated age. HEENT:  No gross abnormalities  I evaluated his cephalic and basilic vein above the antecubital crease.  Both of these veins appear to be of excellent caliber, however both are at least 1 cm below the skin. He has a palpable radial pulse. No significant swelling of the arm. All incisions in the forearm have completely healed  Diagnostic Studies  independent evaluation of the cephalic and basilic vein in the right arm were performed by myself with ultrasound.  This shows adequate cephalic and basilic vein above the antecubital crease  Assessment:  end-stage renal disease Plan:  I discussed proceeding with a right arm fistula.  I will evaluate his cephalic and basilic vein in the operating room.  Most likely I will proceed with a brachiocephalic fistula. We also discussed the possibility of a basilic vein transposition. Regardless of the operation, I think the patient will require a second procedure for elevation of the fistula as both veins appear to be very deep under the skin.  I have scheduled him for this Thursday, December 8  V. Leia Alf, M.D. [facility] Office: [phone] Pager:  [phone]

## 2015-09-12 NOTE — Progress Notes (Signed)
Filed Vitals:   09/12/15 1207 09/12/15 1214  BP: 158/98 163/86  Pulse: 89 88  Temp: 98.5 F (36.9 C)   TempSrc: Oral   Resp: 16   Height: 6\' 1"  (1.854 m)   Weight: 310 lb (140.615 kg)   SpO2: 98%

## 2015-09-13 ENCOUNTER — Encounter: Payer: Self-pay | Admitting: Nephrology

## 2015-09-13 ENCOUNTER — Ambulatory Visit: Payer: Self-pay | Admitting: Vascular Surgery

## 2015-09-14 ENCOUNTER — Telehealth: Payer: Self-pay | Admitting: Endocrinology

## 2015-09-14 ENCOUNTER — Encounter (HOSPITAL_COMMUNITY): Payer: Self-pay | Admitting: *Deleted

## 2015-09-14 MED ORDER — DEXTROSE 5 % IV SOLN
1.5000 g | INTRAVENOUS | Status: AC
  Administered 2015-09-15: 1.5 g via INTRAVENOUS

## 2015-09-14 MED ORDER — SODIUM CHLORIDE 0.9 % IV SOLN
INTRAVENOUS | Status: DC
  Administered 2015-09-15 (×2): via INTRAVENOUS

## 2015-09-14 MED ORDER — CHLORHEXIDINE GLUCONATE CLOTH 2 % EX PADS: 6.0000 | MEDICATED_PAD | Freq: Once | CUTANEOUS | Status: DC

## 2015-09-14 NOTE — Telephone Encounter (Signed)
Helene Kelp calling from ext [phone]  Needs instructions to give pt for the dosing of the Humulin R he will be NPO after midnight

## 2015-09-14 NOTE — Telephone Encounter (Signed)
Noted, she is aware. 

## 2015-09-14 NOTE — Telephone Encounter (Signed)
No change in Humulin R needed the night before, he will take the usual amount, reduce Lantus by 5 units in the previous day

## 2015-09-14 NOTE — Telephone Encounter (Signed)
Please see below and advise.

## 2015-09-14 NOTE — Progress Notes (Signed)
When I instructed pt about his insulin use for tonight and tomorrow, he stated that Dr. Trula Slade had already given him instructions and he prefers to follow Dr. Stephens Shire instructions. He states he was instructed NOT to take his Lantus insulin in the AM. He was told to take his regular dose of Humulin insulin tonight, but not to take the AM dose tomorrow.  Pt states he has a history of SVT, but states he's not had any issues since he stopped caffeine use.

## 2015-09-14 NOTE — Progress Notes (Signed)
Called Dr. Ronnie Derby office to get instructions for pt's Humulin insulin (500units/ml). Dr. Ronnie Derby office nurse states that Dr. Dwyane Dee instructed that to reduce his Lantus Insulin in the AM by 5 units (which will make him take 25 units in AM), pt to take his regular dose of Humulin insulin tonight and not to take his AM dose tomorrow. Will give these instructions to pt during pre-op call.

## 2015-09-15 ENCOUNTER — Encounter (HOSPITAL_COMMUNITY): Payer: Self-pay | Admitting: *Deleted

## 2015-09-15 ENCOUNTER — Ambulatory Visit (HOSPITAL_COMMUNITY)
Admission: RE | Admit: 2015-09-15 | Discharge: 2015-09-15 | Disposition: A | Discharge status: Home / Self Care | Payer: BLUE CROSS/BLUE SHIELD | Source: Ambulatory Visit | Attending: Surgery | Admitting: Surgery

## 2015-09-15 ENCOUNTER — Ambulatory Visit (HOSPITAL_COMMUNITY): Payer: BLUE CROSS/BLUE SHIELD | Admitting: Anesthesiology

## 2015-09-15 ENCOUNTER — Other Ambulatory Visit: Payer: Self-pay | Admitting: *Deleted

## 2015-09-15 ENCOUNTER — Encounter (HOSPITAL_COMMUNITY)
Admission: RE | Disposition: A | Discharge status: Home / Self Care | Payer: Self-pay | Source: Ambulatory Visit | Attending: Surgery

## 2015-09-15 DIAGNOSIS — E785 Hyperlipidemia, unspecified: Secondary | ICD-10-CM | POA: Diagnosis not present

## 2015-09-15 DIAGNOSIS — K219 Gastro-esophageal reflux disease without esophagitis: Secondary | ICD-10-CM | POA: Diagnosis not present

## 2015-09-15 DIAGNOSIS — Z7982 Long term (current) use of aspirin: Secondary | ICD-10-CM | POA: Insufficient documentation

## 2015-09-15 DIAGNOSIS — Z87891 Personal history of nicotine dependence: Secondary | ICD-10-CM | POA: Diagnosis not present

## 2015-09-15 DIAGNOSIS — E1122 Type 2 diabetes mellitus with diabetic chronic kidney disease: Secondary | ICD-10-CM | POA: Diagnosis not present

## 2015-09-15 DIAGNOSIS — I120 Hypertensive chronic kidney disease with stage 5 chronic kidney disease or end stage renal disease: Secondary | ICD-10-CM | POA: Insufficient documentation

## 2015-09-15 DIAGNOSIS — D649 Anemia, unspecified: Secondary | ICD-10-CM | POA: Insufficient documentation

## 2015-09-15 DIAGNOSIS — Z6841 Body Mass Index (BMI) 40.0 and over, adult: Secondary | ICD-10-CM | POA: Diagnosis not present

## 2015-09-15 DIAGNOSIS — E039 Hypothyroidism, unspecified: Secondary | ICD-10-CM | POA: Insufficient documentation

## 2015-09-15 DIAGNOSIS — N186 End stage renal disease: Secondary | ICD-10-CM | POA: Diagnosis present

## 2015-09-15 DIAGNOSIS — Z79899 Other long term (current) drug therapy: Secondary | ICD-10-CM | POA: Insufficient documentation

## 2015-09-15 DIAGNOSIS — Z48812 Encounter for surgical aftercare following surgery on the circulatory system: Secondary | ICD-10-CM

## 2015-09-15 DIAGNOSIS — Z905 Acquired absence of kidney: Secondary | ICD-10-CM | POA: Diagnosis not present

## 2015-09-15 DIAGNOSIS — G4730 Sleep apnea, unspecified: Secondary | ICD-10-CM | POA: Insufficient documentation

## 2015-09-15 DIAGNOSIS — Z992 Dependence on renal dialysis: Secondary | ICD-10-CM | POA: Diagnosis not present

## 2015-09-15 DIAGNOSIS — N185 Chronic kidney disease, stage 5: Secondary | ICD-10-CM | POA: Diagnosis not present

## 2015-09-15 DIAGNOSIS — Z794 Long term (current) use of insulin: Secondary | ICD-10-CM | POA: Insufficient documentation

## 2015-09-15 DIAGNOSIS — E669 Obesity, unspecified: Secondary | ICD-10-CM | POA: Diagnosis not present

## 2015-09-15 HISTORY — PX: AV FISTULA PLACEMENT: SHX1204

## 2015-09-15 LAB — GLUCOSE, CAPILLARY
GLUCOSE-CAPILLARY: 74 mg/dL (ref 65–99)
GLUCOSE-CAPILLARY: 77 mg/dL (ref 65–99)
Glucose-Capillary: 84 mg/dL (ref 65–99)

## 2015-09-15 LAB — POCT I-STAT 4, (NA,K, GLUC, HGB,HCT)
GLUCOSE: 75 mg/dL (ref 65–99)
HEMATOCRIT: 34 % — AB (ref 39.0–52.0)
Hemoglobin: 11.6 g/dL — ABNORMAL LOW (ref 13.0–17.0)
POTASSIUM: 4.1 mmol/L (ref 3.5–5.1)
Sodium: 136 mmol/L (ref 135–145)

## 2015-09-15 SURGERY — ARTERIOVENOUS (AV) FISTULA CREATION
Anesthesia: General | Site: Arm Upper | Laterality: Right

## 2015-09-15 MED ORDER — MIDAZOLAM HCL 2 MG/2ML IJ SOLN
INTRAMUSCULAR | Status: AC
  Filled 2015-09-15: qty 2

## 2015-09-15 MED ORDER — PHENYLEPHRINE 40 MCG/ML (10ML) SYRINGE FOR IV PUSH (FOR BLOOD PRESSURE SUPPORT)
PREFILLED_SYRINGE | INTRAVENOUS | Status: AC
  Filled 2015-09-15: qty 10

## 2015-09-15 MED ORDER — PHENYLEPHRINE HCL 10 MG/ML IJ SOLN
INTRAMUSCULAR | Status: AC
  Filled 2015-09-15: qty 1

## 2015-09-15 MED ORDER — ONDANSETRON HCL 4 MG/2ML IJ SOLN
INTRAMUSCULAR | Status: DC | PRN
  Administered 2015-09-15: 4 mg via INTRAVENOUS

## 2015-09-15 MED ORDER — ONDANSETRON HCL 4 MG/2ML IJ SOLN: 4.0000 mg | Freq: Once | INTRAMUSCULAR | Status: DC | PRN

## 2015-09-15 MED ORDER — OXYCODONE HCL 5 MG/5ML PO SOLN: 5.0000 mg | Freq: Once | ORAL | Status: AC | PRN

## 2015-09-15 MED ORDER — HYDROMORPHONE HCL 1 MG/ML IJ SOLN
INTRAMUSCULAR | Status: DC
Start: 2015-09-15 — End: 2015-09-15
  Filled 2015-09-15: qty 1

## 2015-09-15 MED ORDER — MIDAZOLAM HCL 5 MG/5ML IJ SOLN
INTRAMUSCULAR | Status: DC | PRN
Start: 2015-09-15 — End: 2015-09-15
  Administered 2015-09-15: 2 mg via INTRAVENOUS

## 2015-09-15 MED ORDER — PROPOFOL 10 MG/ML IV BOLUS
INTRAVENOUS | Status: AC
  Filled 2015-09-15: qty 20

## 2015-09-15 MED ORDER — PROTAMINE SULFATE 10 MG/ML IV SOLN
INTRAVENOUS | Status: DC | PRN
  Administered 2015-09-15: 15 mg via INTRAVENOUS
  Administered 2015-09-15: 10 mg via INTRAVENOUS

## 2015-09-15 MED ORDER — PROPOFOL 10 MG/ML IV BOLUS
INTRAVENOUS | Status: DC | PRN
  Administered 2015-09-15: 200 mg via INTRAVENOUS

## 2015-09-15 MED ORDER — GLYCOPYRROLATE 0.2 MG/ML IJ SOLN
INTRAMUSCULAR | Status: AC
  Filled 2015-09-15: qty 1

## 2015-09-15 MED ORDER — 0.9 % SODIUM CHLORIDE (POUR BTL) OPTIME
TOPICAL | Status: DC | PRN
  Administered 2015-09-15: 1000 mL

## 2015-09-15 MED ORDER — PHENYLEPHRINE HCL 10 MG/ML IJ SOLN
INTRAMUSCULAR | Status: DC | PRN
  Administered 2015-09-15: 120 ug via INTRAVENOUS
  Administered 2015-09-15 (×2): 80 ug via INTRAVENOUS

## 2015-09-15 MED ORDER — OXYCODONE-ACETAMINOPHEN 5-325 MG PO TABS: 1.0000 | ORAL_TABLET | ORAL | Status: DC | PRN

## 2015-09-15 MED ORDER — OXYCODONE HCL 5 MG PO TABS
5.0000 mg | ORAL_TABLET | Freq: Once | ORAL | Status: AC | PRN
  Administered 2015-09-15: 5 mg via ORAL

## 2015-09-15 MED ORDER — HYDROMORPHONE HCL 1 MG/ML IJ SOLN
0.2500 mg | INTRAMUSCULAR | Status: DC | PRN
  Administered 2015-09-15 (×4): 0.5 mg via INTRAVENOUS

## 2015-09-15 MED ORDER — OXYCODONE HCL 5 MG PO TABS
ORAL_TABLET | ORAL | Status: DC
Start: 2015-09-15 — End: 2015-09-15
  Filled 2015-09-15: qty 1

## 2015-09-15 MED ORDER — LIDOCAINE HCL (CARDIAC) 20 MG/ML IV SOLN
INTRAVENOUS | Status: DC | PRN
  Administered 2015-09-15: 25 mg via INTRAVENOUS

## 2015-09-15 MED ORDER — FENTANYL CITRATE (PF) 250 MCG/5ML IJ SOLN
INTRAMUSCULAR | Status: AC
  Filled 2015-09-15: qty 5

## 2015-09-15 MED ORDER — EPHEDRINE SULFATE 50 MG/ML IJ SOLN
INTRAMUSCULAR | Status: DC | PRN
  Administered 2015-09-15: 5 mg via INTRAVENOUS

## 2015-09-15 MED ORDER — FENTANYL CITRATE (PF) 100 MCG/2ML IJ SOLN
INTRAMUSCULAR | Status: DC | PRN
  Administered 2015-09-15: 100 ug via INTRAVENOUS
  Administered 2015-09-15: 50 ug via INTRAVENOUS

## 2015-09-15 MED ORDER — SODIUM CHLORIDE 0.9 % IV SOLN
10.0000 mg | INTRAVENOUS | Status: DC | PRN
  Administered 2015-09-15: 25 ug/min via INTRAVENOUS

## 2015-09-15 MED ORDER — HEPARIN SODIUM (PORCINE) 1000 UNIT/ML IJ SOLN
INTRAMUSCULAR | Status: DC | PRN
  Administered 2015-09-15: 3000 [IU] via INTRAVENOUS

## 2015-09-15 MED ORDER — PROTAMINE SULFATE 10 MG/ML IV SOLN
INTRAVENOUS | Status: AC
  Filled 2015-09-15: qty 10

## 2015-09-15 MED ORDER — HEMOSTATIC AGENTS (NO CHARGE) OPTIME
TOPICAL | Status: DC | PRN
  Administered 2015-09-15: 1 via TOPICAL

## 2015-09-15 MED ORDER — SUCCINYLCHOLINE CHLORIDE 20 MG/ML IJ SOLN
INTRAMUSCULAR | Status: DC | PRN
  Administered 2015-09-15: 140 mg via INTRAVENOUS

## 2015-09-15 MED ORDER — SODIUM CHLORIDE 0.9 % IV SOLN
INTRAVENOUS | Status: DC | PRN
  Administered 2015-09-15: 11:00:00

## 2015-09-15 SURGICAL SUPPLY — 29 items
AGENT HMST SPONGE THK3/8 (HEMOSTASIS) ×1
ARMBAND PINK RESTRICT EXTREMIT (MISCELLANEOUS) ×3 IMPLANT
CANISTER SUCTION 2500CC (MISCELLANEOUS) ×3 IMPLANT
CLIP TI MEDIUM 6 (CLIP) ×3 IMPLANT
CLIP TI WIDE RED SMALL 6 (CLIP) ×3 IMPLANT
COVER PROBE W GEL 5X96 (DRAPES) ×3 IMPLANT
ELECT REM PT RETURN 9FT ADLT (ELECTROSURGICAL) ×3
ELECTRODE REM PT RTRN 9FT ADLT (ELECTROSURGICAL) ×1 IMPLANT
GLOVE BIOGEL PI IND STRL 7.5 (GLOVE) ×1 IMPLANT
GLOVE BIOGEL PI INDICATOR 7.5 (GLOVE) ×2
GLOVE SURG SS PI 7.5 STRL IVOR (GLOVE) ×3 IMPLANT
GOWN STRL REUS W/ TWL LRG LVL3 (GOWN DISPOSABLE) ×2 IMPLANT
GOWN STRL REUS W/ TWL XL LVL3 (GOWN DISPOSABLE) ×1 IMPLANT
GOWN STRL REUS W/TWL LRG LVL3 (GOWN DISPOSABLE) ×6
GOWN STRL REUS W/TWL XL LVL3 (GOWN DISPOSABLE) ×3
HEMOSTAT SNOW SURGICEL 2X4 (HEMOSTASIS) IMPLANT
HEMOSTAT SPONGE AVITENE ULTRA (HEMOSTASIS) ×2 IMPLANT
KIT BASIN OR (CUSTOM PROCEDURE TRAY) ×3 IMPLANT
KIT ROOM TURNOVER OR (KITS) ×3 IMPLANT
LIQUID BAND (GAUZE/BANDAGES/DRESSINGS) ×3 IMPLANT
NS IRRIG 1000ML POUR BTL (IV SOLUTION) ×3 IMPLANT
PACK CV ACCESS (CUSTOM PROCEDURE TRAY) ×3 IMPLANT
PAD ARMBOARD 7.5X6 YLW CONV (MISCELLANEOUS) ×6 IMPLANT
SUT PROLENE 6 0 CC (SUTURE) ×3 IMPLANT
SUT VIC AB 3-0 SH 27 (SUTURE) ×3
SUT VIC AB 3-0 SH 27X BRD (SUTURE) ×1 IMPLANT
SUT VICRYL 4-0 PS2 18IN ABS (SUTURE) IMPLANT
UNDERPAD 30X30 INCONTINENT (UNDERPADS AND DIAPERS) ×3 IMPLANT
WATER STERILE IRR 1000ML POUR (IV SOLUTION) ×3 IMPLANT

## 2015-09-15 NOTE — Interval H&P Note (Signed)
History and Physical Interval Note:  09/15/2015 10:08 AM  Robert [name]  has presented today for surgery, with the diagnosis of End Stage Renal Disease N18.6  The various methods of treatment have been discussed with the patient and family. After consideration of risks, benefits and other options for treatment, the patient has consented to  Procedure(s): ARTERIOVENOUS (AV) FISTULA CREATION- RIGHT ARM (Right) as a surgical intervention .  The patient's history has been reviewed, patient examined, no change in status, stable for surgery.  I have reviewed the patient's chart and labs.  Questions were answered to the patient's satisfaction.     [name]

## 2015-09-15 NOTE — Op Note (Signed)
    Patient name: Robert [name] MRN: [id number] DOB: 1961-08-23 Sex: male  09/15/2015 Pre-operative Diagnosis: End stage renal disease Post-operative diagnosis:  Same Surgeon:  [name] Assistants:  Gerri Lins Procedure:   Right brachiocephalic fistula Anesthesia:  Gen. Blood Loss:  See anesthesia record Specimens:  None  Findings:  A 6 mm vein, 3 mm artery  Indications:  The patient has previously had a right radiocephalic fistula which has occluded.  He is here for new access.  In the office the cephalic and basilic vein looked usable.  Procedure:  The patient was identified in the holding area and taken to [facility] [facility]  The patient was then placed supine on the table. general anesthesia was administered.  The patient was prepped and draped in the usual sterile fashion.  A time out was called and antibiotics were administered.  Ultrasound was used to map the course of the cephalic vein in the upper arm.  This was an excellent vein measuring approximately 5-6 millimeters and easily compressible throughout the arm.  I then made a transverse incision at the antecubital crease.  I dissected out the vein and mobilized it throughout the width of the incision.  It was marked with an ink pen for orientation.  This was an excellent-appearing vein approximate 5 x 6 mm.  I then dissected out the brachial artery.  This was a disease-free 3-4 millimeters artery.  3000 units of heparin was then administered.  The vein was then ligated distally.  It easily flushed with heparinized saline.  Next, the artery was occluded with vascular clamps.  A #11 blade was used to make an arteriotomy which was extended longitudinally with Potts scissors.  The vein was cut to the appropriate length and spatulated to fit the size of the arteriotomy.  A running end-to-side anastomosis was then created with 6-0 Prolene.  Prior to completion, the appropriate flushing maneuvers were performed and the anastomosis was  completed.  There was excellent thrill within the fistula which was palpable.  There was also  biphasic radial and ulnar artery Doppler flow.  25 mg of protamine was then administered.  Hemostasis was satisfactory.  The wound was closed with 2 layers of 3-0 Vicryl followed by Dermabond.  There were no immediate complications. Disposition:  To PACU in stable condition.   Theotis Burrow, M.D. [facility] Office: [phone] Pager:  [phone]

## 2015-09-15 NOTE — Transfer of Care (Signed)
Immediate Anesthesia Transfer of Care Note  Patient: Robert [name]  Procedure(s) Performed: Procedure(s): ARTERIOVENOUS (AV) FISTULA CREATION- RIGHT ARM (Right)  Patient Location: PACU  Anesthesia Type:General  Level of Consciousness: awake, alert  and oriented  Airway & Oxygen Therapy: Patient Spontanous Breathing and Patient connected to face mask oxygen  Post-op Assessment: Report given to RN, Post -op Vital signs reviewed and stable and Patient moving all extremities X 4  Post vital signs: Reviewed and stable  Last Vitals:  Filed Vitals:   09/15/15 0817  BP: 152/78  Pulse: 94  Temp: 37.3 C  Resp: 18    Complications: No apparent anesthesia complications

## 2015-09-15 NOTE — Anesthesia Postprocedure Evaluation (Signed)
Anesthesia Post Note  Patient: Robert [name]  Procedure(s) Performed: Procedure(s) (LRB): ARTERIOVENOUS (AV) FISTULA CREATION- RIGHT ARM (Right)  Patient location during evaluation: PACU Anesthesia Type: General Level of consciousness: awake and awake and alert Pain management: pain level controlled Respiratory status: spontaneous breathing Anesthetic complications: no    Last Vitals:  Filed Vitals:   09/15/15 1345 09/15/15 1401  BP: 113/54 97/68  Pulse:  99  Temp: 36.6 C   Resp:  20    Last Pain:  Filed Vitals:   09/15/15 1403  PainSc: 3                  Marvis Bakken COKER

## 2015-09-15 NOTE — Anesthesia Preprocedure Evaluation (Addendum)
Anesthesia Evaluation  Patient identified by MRN, date of birth, ID band Patient awake    Reviewed: Allergy & Precautions, NPO status , Patient's Chart, lab work & pertinent test results  Airway Mallampati: II  TM Distance: >3 FB Neck ROM: Full    Dental  (+) Teeth Intact, Dental Advisory Given   Pulmonary shortness of breath, sleep apnea , former smoker,    breath sounds clear to auscultation       Cardiovascular hypertension,  Rhythm:Regular Rate:Normal     Neuro/Psych  Headaches,    GI/Hepatic GERD  Medicated and Controlled,  Endo/Other  diabetesHypothyroidism   Renal/GU ESRFRenal disease     Musculoskeletal   Abdominal (+) + obese,   Peds  Hematology  (+) anemia ,   Anesthesia Other Findings Left ventricle: The cavity size was normal. There was moderate concentric hypertrophy. Systolic function was normal. The estimated ejection fraction was in the range of 60% to 65%. Wall motion was normal; there were no regional wall motion abnormalities. Doppler parameters are consistent with abnormal left ventricular relaxation (grade 1 diastolic dysfunction). - Aortic valve: Trivial regurgitation. - Aorta: The aorta was normal, not dilated, and non-diseased. - Mitral valve: No regurgitation. - Left atrium: The atrium was mildly dilated. - Right ventricle: Systolic function was normal. - Right atrium: The atrium was normal in size. - Atrial septum: No defect or patent foramen ovale was identified. - Tricuspid valve: No regurgitation. - Pulmonic valve: No regurgitation. - Pericardium, extracardiac: There was no pericardial effusion.   Reproductive/Obstetrics                           Anesthesia Physical Anesthesia Plan  ASA: III  Anesthesia Plan: General   Post-op Pain Management:    Induction: Intravenous  Airway Management Planned: Oral ETT  Additional  Equipment:   Intra-op Plan:   Post-operative Plan: Extubation in OR  Informed Consent: I have reviewed the patients History and Physical, chart, labs and discussed the procedure including the risks, benefits and alternatives for the proposed anesthesia with the patient or authorized representative who has indicated his/her understanding and acceptance.   Dental advisory given and Dental Advisory Given  Plan Discussed with: CRNA and Anesthesiologist  Anesthesia Plan Comments:        Anesthesia Quick Evaluation

## 2015-09-15 NOTE — Anesthesia Procedure Notes (Signed)
Procedure Name: Intubation Date/Time: 09/15/2015 11:05 AM Performed by: Robert [name] Pre-anesthesia Checklist: Timeout performed, Patient being monitored, Suction available, Emergency Drugs available and Patient identified Patient Re-evaluated:Patient Re-evaluated prior to inductionOxygen Delivery Method: Circle system utilized Preoxygenation: Pre-oxygenation with 100% oxygen Intubation Type: IV induction Ventilation: Mask ventilation without difficulty Laryngoscope Size: Mac and 4 Grade View: Grade I Tube type: Oral Tube size: 7.5 mm Number of attempts: 1 Placement Confirmation: breath sounds checked- equal and bilateral,  positive ETCO2 and ETT inserted through vocal cords under direct vision Secured at: 23 cm Tube secured with: Tape Dental Injury: Teeth and Oropharynx as per pre-operative assessment

## 2015-09-15 NOTE — H&P (View-Only) (Signed)
Patient name: Robert [name] MRN: [id number] DOB: 1961-07-22 Sex: male     Chief Complaint  Patient presents with  . New Evaluation    Ref. by Dr. Augustin Coupe  C/O  New possible AVG access.  HD:  Mon-Wed-Fri.  Pt is left handed    HISTORY OF PRESENT ILLNESS:  The patient is back for follow-up.  On 02/25/2015 he underwent creation of a right radiocephalic fistula.  This required elevation and branch ligation on 05/26/2015.  He recently occluded his fistula and underwent from a lysis at the [facility] Center. He did not have a good long-term result.  It was felt that there was too much disease within the veins to continue fistula preservation and therefore a catheter was placed and he is reerred back today for new access.  Past Medical History  Diagnosis Date  . Diabetes mellitus without complication ([place])   . Thyroid disease   . Hyperlipidemia   . PAT (paroxysmal atrial tachycardia) ([place])   . PSVT (paroxysmal supraventricular tachycardia) ([place])   . Blood transfusion without reported diagnosis 1974    kidney removed - L  . CPAP (continuous positive airway pressure) dependence   . Dysrhythmia   . Shortness of breath   . Hypothyroidism   . GERD (gastroesophageal reflux disease)     n/v a lot  . Headache(784.0)     slight dull h/a due kidney failure  . Anemia     low hgb at present  . Umbilical hernia     will repair with thi surgery  . Neuropathy (HCC)     right hand and both feet  . Constipation   . Macular degeneration   . Eczema   . Sleep apnea 06/18/11    split-night sleep study- [facility] BiPAP- no oxygen  . Chronic kidney disease     Stage IV, Richarda Blade. M,W,F  . Cancer ([place])     skin ca right shoulder, plastic dsyplasia, pre-Ca polpys removed on Colonoscopy- 07/2014  . Hypertension     SVT h/o , followed by Dr. Claiborne Billings  . SBO (small bowel obstruction) ([place])   . Difficult intubation     unsure of actual problem but it was during the January 28, 2015  procedure.    Past Surgical History  Procedure Laterality Date  . Nephrectomy Left 1974  . Renal biopsy Right 2012  . Skin cancer excision      right shoulder  . Capd insertion N/A 05/18/2014    Procedure: LAPAROSCOPIC INSERTION CONTINUOUS AMBULATORY PERITONEAL DIALYSIS  (CAPD) CATHETER;  Surgeon: Ralene Ok, MD;  Location: [place];  Service: General;  Laterality: N/A;  . Umbilical hernia repair N/A 05/18/2014    Procedure: HERNIA REPAIR UMBILICAL ADULT;  Surgeon: Ralene Ok, MD;  Location: [place];  Service: General;  Laterality: N/A;  . Laparoscopic repositioning capd catheter N/A 06/16/2014    Procedure: LAPAROSCOPIC REPOSITIONING CAPD CATHETER;  Surgeon: Ralene Ok, MD;  Location: [place];  Service: General;  Laterality: N/A;  . Ij catheter insertion    . Colonoscopy    . Insertion of dialysis catheter Right 02/25/2015    Procedure: INSERTION OF RIGHT INTERNAL JUGULAR DIALYSIS CATHETER;  Surgeon: Serafina Mitchell, MD;  Location: [place];  Service: Vascular;  Laterality: Right;  . Av fistula placement Right 02/25/2015    Procedure: RIGHT ARTERIOVENOUS (AV) FISTULA CREATION;  Surgeon: Serafina Mitchell, MD;  Location: [place];  Service: Vascular;  Laterality: Right;  .  Umbilical hernia repair N/A 04/28/2015    Procedure: UMBILICAL HERNIA REPAIR WITH MESH;  Surgeon: Coralie Keens, MD;  Location: [place];  Service: General;  Laterality: N/A;  . Minor removal of peritoneal dialysis catheter N/A 04/28/2015    Procedure:  REMOVAL OF PERITONEAL DIALYSIS CATHETER;  Surgeon: Coralie Keens, MD;  Location: [place];  Service: General;  Laterality: N/A;  . Laparoscopy N/A 05/04/2015    Procedure: LAPAROSCOPY DIAGNOSTIC LYSIS OF ADHESIONS;  Surgeon: Coralie Keens, MD;  Location: [place];  Service: General;  Laterality: N/A;  . Revison of arteriovenous fistula Right 05/26/2015    Procedure: SUPERFICIALIZATION OF ARTERIOVENOUS FISTULA WITH SIDE BRANCH LIGATIONS;  Surgeon: Serafina Mitchell, MD;  [facility];  Service: Vascular;  Laterality: Right;    Social History   Social History  . Marital Status: Married    Spouse Name: N/A  . Number of Children: 3  . 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 Smoking status: Former Smoker    Types: Pipe    Quit date: 12/06/2013  . Smokeless tobacco: Never Used  . Alcohol Use: 0.6 oz/week    1 Glasses of wine per week     Comment: 1 glass of wine per week, rare use as of 05/25/15  . Drug Use: No  . Sexual Activity: Not on file   Other Topics Concern  . Not on file   Social History Narrative    Family History  Problem Relation Age of Onset  . Colon polyps Father   . Colon cancer Neg Hx   . Stomach cancer Neg Hx     Allergies as of 09/12/2015  . (No Known Allergies)    Current Outpatient Prescriptions on File Prior to Visit  Medication Sig Dispense Refill  . aspirin 81 MG tablet Take 81 mg by mouth every morning.     Marland Kitchen CALCITRIOL PO Take 75 mg by mouth 3 (three) times a week. Monday, Wednesday and Friday.    . calcium acetate (PHOSLO) 667 MG capsule Take 667 mg by mouth 3 (three) times daily with meals.    . cetirizine (ZYRTEC) 10 MG tablet Take 10 mg by mouth every morning.     . docusate sodium (COLACE) 100 MG capsule Take 100 mg by mouth daily.     . fluocinonide cream (LIDEX) [id number] % Apply 1 application topically daily as needed (Skin inflammation (hand, thigh, back)).     [name] 1000 MG chewable tablet Chew 1,000-2,000 mg by mouth 4 (four) times daily. 2000 mg with every meal and 1000 mg with snack    . glucose blood (ONETOUCH VERIO) test strip Use as instructed to check blood sugar 6 a day dx code E11.29 550 each 3  . Insulin Glargine (LANTUS SOLOSTAR) 100 UNIT/ML Solostar Pen Inject 15 Units into the skin every morning. (Patient taking differently: Inject 30 Units into the skin daily. In the morning) 15 mL 0  . insulin regular human CONCENTRATED (HUMULIN) 500 UNIT/ML  injection Inject 6 units in the morning and 8 units in the pm 2 pen 3  . Insulin Syringe-Needle U-100 (INSULIN SYRINGE 1CC/31GX5/16") 31G X 5/16" 1 ML MISC Use to inject insulin 100 each 3  . levothyroxine (SYNTHROID, LEVOTHROID) 100 MCG tablet TAKE 1 TABLET BY MOUTH EVERY DAY 90 tablet 1  . NOVOFINE 32G X 6 MM MISC USE 4 PEN NEEDLES PER DAY TO INJECT INSULIN 400 each 1  .  polyethylene glycol (MIRALAX / GLYCOLAX) packet Take 17 g by mouth daily.    Marland Kitchen senna (SENOKOT) 8.6 MG tablet Take 1 tablet by mouth daily.    Marland Kitchen BYSTOLIC 5 MG tablet Take 5 mg by mouth every evening. Take with 10mg  tablet for a total dose of 15mg .  3  . nebivolol (BYSTOLIC) 10 MG tablet Take 10 mg by mouth every evening. Take with 5mg  tablet for a total dose of 15mg .    . OVER THE COUNTER MEDICATION Take 1 tablet by mouth daily. OTC DiaVite    . oxyCODONE (ROXICODONE) 5 MG immediate release tablet Take 1 tablet (5 mg total) by mouth every 6 (six) hours as needed. (Patient not taking: Reported on 09/12/2015) 20 tablet 0  . Peritoneal Dialysis Solutions (DIALYTE/1.5% DEXTROSE IP) Inject into the peritoneum.     No current facility-administered medications on file prior to visit.     REVIEW OF SYSTEMS: Cardiovascular: No chest pain, chest pressure, palpitations, orthopnea, or dyspnea on exertion. No claudication or rest pain,  No history of DVT or phlebitis. Pulmonary: No productive cough, asthma or wheezing. Neurologic: No weakness, paresthesias, aphasia, or amaurosis. No dizziness. Hematologic: No bleeding problems or clotting disorders. Musculoskeletal: No joint pain or joint swelling. Gastrointestinal: No blood in stool or hematemesis Genitourinary: No dysuria or hematuria. Psychiatric:: No history of major depression. Integumentary: No rashes or ulcers. Constitutional: No fever or chills.  PHYSICAL EXAMINATION:   Vital signs are  Filed Vitals:   09/12/15 1207 09/12/15 1214  BP: 158/98 163/86  Pulse: 89 88  Temp:  98.5 F (36.9 C)   TempSrc: Oral   Resp: 16   Height: 6\' 1"  (1.854 m)   Weight: 310 lb (140.615 kg)   SpO2: 98%    Body mass index is 40.91 kg/(m^2). General: The patient appears their stated age. HEENT:  No gross abnormalities  I evaluated his cephalic and basilic vein above the antecubital crease.  Both of these veins appear to be of excellent caliber, however both are at least 1 cm below the skin. He has a palpable radial pulse. No significant swelling of the arm. All incisions in the forearm have completely healed  Diagnostic Studies  independent evaluation of the cephalic and basilic vein in the right arm were performed by myself with ultrasound.  This shows adequate cephalic and basilic vein above the antecubital crease  Assessment:  end-stage renal disease Plan:  I discussed proceeding with a right arm fistula.  I will evaluate his cephalic and basilic vein in the operating room.  Most likely I will proceed with a brachiocephalic fistula. We also discussed the possibility of a basilic vein transposition. Regardless of the operation, I think the patient will require a second procedure for elevation of the fistula as both veins appear to be very deep under the skin.  I have scheduled him for this Thursday, December 8  V. Leia Alf, M.D. [facility] Office: [phone] Pager:  [phone]

## 2015-09-16 ENCOUNTER — Encounter (HOSPITAL_COMMUNITY): Payer: Self-pay | Admitting: Surgery

## 2015-09-16 ENCOUNTER — Other Ambulatory Visit (INDEPENDENT_AMBULATORY_CARE_PROVIDER_SITE_OTHER): Payer: BLUE CROSS/BLUE SHIELD

## 2015-09-16 DIAGNOSIS — N186 End stage renal disease: Secondary | ICD-10-CM | POA: Diagnosis not present

## 2015-09-16 DIAGNOSIS — E1165 Type 2 diabetes mellitus with hyperglycemia: Secondary | ICD-10-CM | POA: Diagnosis not present

## 2015-09-16 DIAGNOSIS — Z992 Dependence on renal dialysis: Secondary | ICD-10-CM

## 2015-09-16 DIAGNOSIS — Z794 Long term (current) use of insulin: Secondary | ICD-10-CM

## 2015-09-16 DIAGNOSIS — E1122 Type 2 diabetes mellitus with diabetic chronic kidney disease: Secondary | ICD-10-CM | POA: Diagnosis not present

## 2015-09-16 DIAGNOSIS — IMO0002 Reserved for concepts with insufficient information to code with codable children: Secondary | ICD-10-CM

## 2015-09-16 DIAGNOSIS — E039 Hypothyroidism, unspecified: Secondary | ICD-10-CM | POA: Diagnosis not present

## 2015-09-16 DIAGNOSIS — E291 Testicular hypofunction: Secondary | ICD-10-CM

## 2015-09-16 LAB — T4, FREE: FREE T4: 1.24 ng/dL (ref 0.60–1.60)

## 2015-09-16 LAB — HEMOGLOBIN A1C: Hgb A1c MFr Bld: 5.8 % (ref 4.6–6.5)

## 2015-09-16 LAB — GLUCOSE, RANDOM: GLUCOSE: 140 mg/dL — AB (ref 70–99)

## 2015-09-16 LAB — TSH: TSH: 2.26 u[IU]/mL (ref 0.35–4.50)

## 2015-09-19 ENCOUNTER — Telehealth: Payer: Self-pay

## 2015-09-19 LAB — TESTOSTERONE, FREE, TOTAL, SHBG
Sex Hormone Binding: 24.4 nmol/L (ref 19.3–76.4)
TESTOSTERONE FREE: 14.2 pg/mL (ref 7.2–24.0)
TESTOSTERONE: 308 ng/dL — AB (ref 348–1197)

## 2015-09-19 LAB — FRUCTOSAMINE: FRUCTOSAMINE: 370 umol/L — AB (ref 0–285)

## 2015-09-19 NOTE — Telephone Encounter (Signed)
Phone call from pt.  Reported a "slight bulge above elbow, on inner aspect right arm.  Stated this was present after the procedure on 12/8.  Reported increased swelling from right fingers up to the elbow since 12/11.  Denied any redness/ warmth, or increased tenderness   Reported the arm is a "normal color".  Stated he has the perception that the arm is cold, but when checked by family, is told it feels warm/ normal temp.  Questioned about numbness/ tingling of right hand/fingers.  Denied any new numbness; reported that the (R) ring and little finger have been numb for several years.  Stated that the skin feels "tight" today.  Enc. to elevate the right arm on pillows, above level of heart, several times/ day.  Advised to call and report worsening symptoms, or if no improvement in swelling.  Advised to watch for signs/ symptoms of infection, or of steal syndrome, and call to report.  Pt. verb. understanding.

## 2015-09-21 ENCOUNTER — Ambulatory Visit (INDEPENDENT_AMBULATORY_CARE_PROVIDER_SITE_OTHER): Payer: BLUE CROSS/BLUE SHIELD | Admitting: Endocrinology

## 2015-09-21 ENCOUNTER — Encounter: Payer: Self-pay | Admitting: Endocrinology

## 2015-09-21 VITALS — BP 162/84 | HR 111 | Temp 98.5°F | Resp 16 | Ht 73.0 in | Wt 304.4 lb

## 2015-09-21 DIAGNOSIS — E063 Autoimmune thyroiditis: Secondary | ICD-10-CM

## 2015-09-21 DIAGNOSIS — N186 End stage renal disease: Secondary | ICD-10-CM

## 2015-09-21 DIAGNOSIS — E1165 Type 2 diabetes mellitus with hyperglycemia: Secondary | ICD-10-CM | POA: Diagnosis not present

## 2015-09-21 DIAGNOSIS — E038 Other specified hypothyroidism: Secondary | ICD-10-CM | POA: Diagnosis not present

## 2015-09-21 DIAGNOSIS — Z992 Dependence on renal dialysis: Secondary | ICD-10-CM

## 2015-09-21 DIAGNOSIS — E1122 Type 2 diabetes mellitus with diabetic chronic kidney disease: Secondary | ICD-10-CM

## 2015-09-21 DIAGNOSIS — IMO0002 Reserved for concepts with insufficient information to code with codable children: Secondary | ICD-10-CM

## 2015-09-21 DIAGNOSIS — Z794 Long term (current) use of insulin: Secondary | ICD-10-CM

## 2015-09-21 NOTE — Patient Instructions (Signed)
Increase walking or other exercise  Check blood sugars on waking up 3-4  times a week Also check blood sugars about 2 hours after a meal and do this after different meals by rotation  Recommended blood sugar levels on waking up is 90-130 and about 2 hours after meal is 130-160  Please bring your blood sugar monitor to each visit, thank you  Continue U-500, 30 min before eating

## 2015-09-21 NOTE — Progress Notes (Signed)
Patient ID: Robert [name], male   DOB: 05-20-1961, 54 y.o.   MRN: [id number]   Reason for Appointment: Type II Diabetes follow-up   History of Present Illness   Diagnosis date:  2012  Previous history: His blood sugar has been difficult to control since onset. Initially was taking oral hypoglycemic drugs like glyburide but could not take metformin because of renal dysfunction. Since Victoza did not help his control he was started on Lantus and then U 500 insulin also His A1c has previously ranged from 7.8-9.8, the lowest level in 03/2013 He was on Victoza but because of his markedly decreased appetite and weight loss this was stopped in 9/15  Recent history:   Insulin regimen: LANTUS 30 units In a.m.  U-500 insulin with syringe: 8 units, 30 min ac breakfast and 6-10 units at lunch, 0-15 units acs  His A1c is usually lower than expected for his blood sugars However fructosamine is significantly high  Current blood sugar patterns, management and problems identified:  Fasting blood sugars are fairly good whenever he checks them, has been high only once or twice  However has had one episode of hypoglycemia overnight possibly from his suppertime dose of U-500 insulin  He has reduced his insulin doses at meals somewhat based on his blood sugar readings and intake, trying to eat less carbohydrate overall  Blood sugars are relatively better in the afternoons also compared to the last visit  He is usually adjusting his insulin based on what he is eating and may take relatively large doses when he is eating high carbohydrate meals like pasta or [place] food at a restaurant  His weight fluctuates and is overall about the same  Proper timing of medications in relation to meals: Yes.          Monitors blood glucose: Once a day or less.    Glucometer: One Touch.          Blood Glucose readings  Mean values apply above for all meters except median for One Touch  PRE-MEAL  Fasting Lunch Dinner Bedtime Overall  Glucose range:  101-167   126, 136    82, 119, 163   48-191   Mean/median:      119    POST-MEAL PC Breakfast PC Lunch PC Dinner  Glucose range:   71-132   191   Mean/median:         Meals: 3 meals per day. 8 am and Noon and Dinner 8-9 PM .  Is on the low phosphorus diet and has fewer carbohydrates, not eating cereals        Physical activity: exercise: some walking           Weight control:  Wt Readings from Last 3 Encounters:  09/21/15 304 lb 6.4 oz (138.075 kg)  09/15/15 295 lb 6.7 oz (134 kg)  09/12/15 310 lb (140.615 kg)          Complications:   nephropathy, neuropathy   Diabetes labs:   Lab Results  Component Value Date   HGBA1C 5.8 09/16/2015   HGBA1C 5.4 06/23/2015   HGBA1C 5.9 05/02/2015   Lab Results  Component Value Date   MICROALBUR 224.5 Repeated and verified X2.* 01/04/2014   LDLCALC 34 09/20/2014   CREATININE 10.76* 05/13/2015       Medication List       This list is accurate as of: 09/21/15  9:30 AM.  Always use your most recent med list.  aspirin 81 MG tablet  Take 81 mg by mouth every morning.     CALCITRIOL PO  Take 75 mg by mouth 3 (three) times a week. Monday, Wednesday and Friday.     calcium acetate 667 MG capsule  Commonly known as:  PHOSLO  Take 667 mg by mouth 3 (three) times daily with meals.     cetirizine 10 MG tablet  Commonly known as:  ZYRTEC  Take 10 mg by mouth every morning.     cinacalcet 30 MG tablet  Commonly known as:  SENSIPAR  Take 30 mg by mouth daily.     docusate sodium 100 MG capsule  Commonly known as:  COLACE  Take 100 mg by mouth daily.     fluocinonide cream 0.05 %  Commonly known as:  LIDEX  Apply 1 application topically daily as needed (Skin inflammation (hand, thigh, back)).     FOSRENOL 1000 MG chewable tablet  Generic drug:  lanthanum  Chew 1,000-2,000 mg by mouth 4 (four) times daily. 2000 mg with every meal and 1000 mg with snack      glucose blood test strip  Commonly known as:  ONETOUCH VERIO  Use as instructed to check blood sugar 6 a day dx code E11.29     Insulin Glargine 100 UNIT/ML Solostar Pen  Commonly known as:  LANTUS SOLOSTAR  Inject 15 Units into the skin every morning.     insulin regular human CONCENTRATED 500 UNIT/ML kwikpen  Commonly known as:  HUMULIN R  Inject 6 units in the morning and 8 units in the pm     INSULIN SYRINGE 1CC/31GX5/16" 31G X 5/16" 1 ML Misc  Use to inject insulin     levothyroxine 100 MCG tablet  Commonly known as:  SYNTHROID, LEVOTHROID  TAKE 1 TABLET BY MOUTH EVERY DAY     NOVOFINE 32G X 6 MM Misc  Generic drug:  Insulin Pen Needle  USE 4 PEN NEEDLES PER DAY TO INJECT INSULIN     oxyCODONE-acetaminophen 5-325 MG tablet  Commonly known as:  PERCOCET/ROXICET  Take 1 tablet by mouth every 4 (four) hours as needed.     polyethylene glycol packet  Commonly known as:  MIRALAX / GLYCOLAX  Take 17 g by mouth daily.     senna 8.6 MG tablet  Commonly known as:  SENOKOT  Take 1 tablet by mouth daily.        Allergies: No Known Allergies  Past Medical History  Diagnosis Date  . Thyroid disease   . Hyperlipidemia   . PAT (paroxysmal atrial tachycardia) ([place])   . PSVT (paroxysmal supraventricular tachycardia) ([place])   . Blood transfusion without reported diagnosis 1974    kidney removed - L  . CPAP (continuous positive airway pressure) dependence   . Dysrhythmia   . Shortness of breath   . Hypothyroidism   . GERD (gastroesophageal reflux disease)     n/v a lot  . Headache(784.0)     slight dull h/a due kidney failure  . Anemia     low hgb at present  . Umbilical hernia     will repair with thi surgery  . Neuropathy (HCC)     right hand and both feet  . Constipation   . Macular degeneration   . Eczema   . Sleep apnea 06/18/11    split-night sleep study- [facility]., BiPAP- no oxygen  . Chronic kidney disease     Stage IV, Richarda Blade.  M,W,F  . Cancer ([place])     skin ca right shoulder, plastic dsyplasia, pre-Ca polpys removed on Colonoscopy- 07/2014  . Hypertension     SVT h/o , followed by Dr. Claiborne Billings  . SBO (small bowel obstruction) ([place])   . Difficult intubation     unsure of actual problem but it was during the January 28, 2015 procedure.  . Diabetes mellitus without complication ([place])     Type 2    Past Surgical History  Procedure Laterality Date  . Nephrectomy Left 1974  . Renal biopsy Right 2012  . Skin cancer excision      right shoulder  . Capd insertion N/A 05/18/2014    Procedure: LAPAROSCOPIC INSERTION CONTINUOUS AMBULATORY PERITONEAL DIALYSIS  (CAPD) CATHETER;  Surgeon: Ralene Ok, MD;  Location: [facility];  Service: General;  Laterality: N/A;  . Umbilical hernia repair N/A 05/18/2014    Procedure: HERNIA REPAIR UMBILICAL ADULT;  Surgeon: Ralene Ok, MD;  Location: [place] [facility];  Service: General;  Laterality: N/A;  . Laparoscopic repositioning capd catheter N/A 06/16/2014    Procedure: LAPAROSCOPIC REPOSITIONING CAPD CATHETER;  Surgeon: Ralene Ok, MD;  Location: [facility];  Service: General;  Laterality: N/A;  . Ij catheter insertion    . Colonoscopy    . Insertion of dialysis catheter Right 02/25/2015    Procedure: INSERTION OF RIGHT INTERNAL JUGULAR DIALYSIS CATHETER;  Surgeon: Serafina Mitchell, MD;  Location: [facility];  Service: Vascular;  Laterality: Right;  . Av fistula placement Right 02/25/2015    Procedure: RIGHT ARTERIOVENOUS (AV) FISTULA CREATION;  Surgeon: Serafina Mitchell, MD;  Location: [facility];  Service: Vascular;  Laterality: Right;  . Umbilical hernia repair N/A 04/28/2015    Procedure: UMBILICAL HERNIA REPAIR WITH MESH;  Surgeon: Coralie Keens, MD;  Location: [facility];  Service: General;  Laterality: N/A;  . Minor removal of peritoneal dialysis catheter N/A 04/28/2015    Procedure:  REMOVAL OF PERITONEAL DIALYSIS CATHETER;  Surgeon: Coralie Keens, MD;  Location: [place];  Service: General;  Laterality:  N/A;  . Laparoscopy N/A 05/04/2015    Procedure: LAPAROSCOPY DIAGNOSTIC LYSIS OF ADHESIONS;  Surgeon: Coralie Keens, MD;  Location: [facility];  Service: General;  Laterality: N/A;  . Revison of arteriovenous fistula Right 05/26/2015    Procedure: SUPERFICIALIZATION OF ARTERIOVENOUS FISTULA WITH SIDE BRANCH LIGATIONS;  Surgeon: Serafina Mitchell, MD;  Location: [place];  Service: Vascular;  Laterality: Right;  . Av fistula placement Right 09/15/2015    Procedure: ARTERIOVENOUS (AV) FISTULA CREATION- RIGHT ARM;  Surgeon: Serafina Mitchell, MD;  [facility];  Service: Vascular;  Laterality: Right;    Family History  Problem Relation Age of Onset  . Colon polyps Father   . Colon cancer Neg Hx   . Stomach cancer Neg Hx     Social History:  reports that he quit smoking about 21 months ago. His smoking use included Pipe. He has never used smokeless tobacco. He reports that he drinks about 0.6 oz of alcohol per week. He reports that he does not use illicit drugs.  Review of Systems:  Hypertension:  BP is variably controlled and he tends to have low readings during dialysis sometimes  His blood pressure has been managed by his nephrologist   Lipids: He has much better levels previously, not clear if he has had any labs done from PCP  Previously on Crestor    Lab Results  Component Value Date   CHOL 87 09/20/2014   CHOL 113  08/09/2014   CHOL 97 03/31/2014   Lab Results  Component Value Date   HDL 36.20* 09/20/2014   HDL 40 08/09/2014   HDL 32.60* 03/31/2014   Lab Results  Component Value Date   LDLCALC 34 09/20/2014   LDLCALC 55 08/09/2014   LDLCALC 20 03/31/2014   Lab Results  Component Value Date   TRIG 83.0 09/20/2014   TRIG 89 08/09/2014   TRIG 221.0* 03/31/2014   Lab Results  Component Value Date   CHOLHDL 2 09/20/2014   CHOLHDL 2.8 08/09/2014   CHOLHDL 3 03/31/2014   No results found for: LDLDIRECT   Thyroid: He has had long-standing hypothyroidism   TSH is normal.    Has been taking his 100 g levothyroxine before breakfast consistently   Lab Results  Component Value Date   TSH 2.26 09/16/2015    He is considering  renal transplant and is going to [facility] for this    HYPOGONADISM: 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 metabolic syndrome and had subjectively improved with supplementation using Fortesta He has been off testosterone for several months and free testosterone is normal  Diabetic neuropathy: Has mild sensory loss on his last exam from podiatrist   Examination:   BP 162/84 mmHg  Pulse 111  Temp(Src) 98.5 F (36.9 C)  Resp 16  Ht 6\' 1"  (1.854 m)  Wt 304 lb 6.4 oz (138.075 kg)  BMI 40.17 kg/m2  SpO2 96%  Body mass index is 40.17 kg/(m^2).    ASSESSMENT/ PLAN:    Diabetes type 2 with obesity:  See history of present illness for detailed discussion of his current management, problems identified and sugar patterns lately His blood sugars have been somewhat variable but overall better controlled Does not do enough readings after supper and not clear if he has high readings at times as judged by his fructosamine which is high. A1c is lower than expected again and in normal range He is however doing much better with estimating his insulin dose for different meals and also probably has better controlled with trying to take insulin 30 minutes before eating Currently will not use the pen for the U-500 because of the higher co-pay  Since fasting readings are mostly near normal he will continue the same dose of Lantus once a day  HYPOTHYROIDISM: We will need to continue the same dose   There are no Patient Instructions on file for this visit.   Chandra Asher 09/21/2015, 9:30 AM

## 2015-09-26 ENCOUNTER — Ambulatory Visit: Payer: BLUE CROSS/BLUE SHIELD | Admitting: Cardiovascular Disease

## 2015-10-07 ENCOUNTER — Ambulatory Visit (INDEPENDENT_AMBULATORY_CARE_PROVIDER_SITE_OTHER): Payer: Medicare Other | Admitting: Podiatry

## 2015-10-07 DIAGNOSIS — B351 Tinea unguium: Secondary | ICD-10-CM | POA: Diagnosis not present

## 2015-10-07 DIAGNOSIS — M79676 Pain in unspecified toe(s): Secondary | ICD-10-CM

## 2015-10-07 DIAGNOSIS — E1149 Type 2 diabetes mellitus with other diabetic neurological complication: Secondary | ICD-10-CM

## 2015-10-10 NOTE — Progress Notes (Signed)
Patient ID: Robert [name], male   DOB: 1960-11-27, 55 y.o.   MRN: [id number]  Subjective: 55 y.o. returns the office today for painful, elongated, thickened toenails which he is unable to trim himself. Denies any redness or drainage around the nails. Denies any acute changes since last appointment and no new complaints today. Denies any systemic complaints such as fevers, chills, nausea, vomiting. He states he tries to cut his own toenails between visits.   Objective: AAO 3, NAD DP/PT pulses palpable, CRT less than 3 seconds Protective sensation decreased with Simms Weinstein monofilament Nails hypertrophic, dystrophic, elongated, brittle, discolored 10. There is tenderness overlying the nails 1-5 bilaterally. There is no surrounding erythema or drainage along the nail sites. No open lesions or pre-ulcerative lesions are identified. No other areas of tenderness bilateral lower extremities. No overlying edema, erythema, increased warmth. No pain with calf compression, swelling, warmth, erythema.  Assessment: Patient presents with symptomatic onychomycosis  Plan: -Treatment options including alternatives, risks, complications were discussed -Nails sharply debrided 10 without complication/bleeding. -Discussed daily foot inspection. If there are any changes, to call the office immediately. Recommended to not cut his own nails.  -Follow-up in 9 weeks or sooner if any problems are to arise. In the meantime, encouraged to call the office with any questions, concerns, changes symptoms.  Celesta Gentile, DPM

## 2015-10-12 ENCOUNTER — Other Ambulatory Visit (HOSPITAL_COMMUNITY): Payer: Self-pay

## 2015-10-13 ENCOUNTER — Other Ambulatory Visit (HOSPITAL_COMMUNITY): Payer: Self-pay

## 2015-10-18 ENCOUNTER — Other Ambulatory Visit (HOSPITAL_COMMUNITY): Payer: Self-pay | Admitting: Internal Medicine

## 2015-10-18 DIAGNOSIS — Z01810 Encounter for preprocedural cardiovascular examination: Secondary | ICD-10-CM

## 2015-10-19 ENCOUNTER — Other Ambulatory Visit (HOSPITAL_COMMUNITY): Payer: Self-pay

## 2015-10-19 ENCOUNTER — Other Ambulatory Visit: Payer: Self-pay | Admitting: Endocrinology

## 2015-10-19 ENCOUNTER — Ambulatory Visit (HOSPITAL_COMMUNITY): Payer: BLUE CROSS/BLUE SHIELD | Attending: Cardiovascular Disease

## 2015-10-19 ENCOUNTER — Ambulatory Visit (HOSPITAL_BASED_OUTPATIENT_CLINIC_OR_DEPARTMENT_OTHER): Payer: BLUE CROSS/BLUE SHIELD

## 2015-10-19 DIAGNOSIS — E669 Obesity, unspecified: Secondary | ICD-10-CM | POA: Insufficient documentation

## 2015-10-19 DIAGNOSIS — I10 Essential (primary) hypertension: Secondary | ICD-10-CM | POA: Insufficient documentation

## 2015-10-19 DIAGNOSIS — E119 Type 2 diabetes mellitus without complications: Secondary | ICD-10-CM | POA: Insufficient documentation

## 2015-10-19 DIAGNOSIS — E785 Hyperlipidemia, unspecified: Secondary | ICD-10-CM | POA: Insufficient documentation

## 2015-10-19 DIAGNOSIS — Z01810 Encounter for preprocedural cardiovascular examination: Secondary | ICD-10-CM | POA: Insufficient documentation

## 2015-10-19 DIAGNOSIS — Z87891 Personal history of nicotine dependence: Secondary | ICD-10-CM | POA: Diagnosis not present

## 2015-10-20 LAB — ECHOCARDIOGRAM STRESS TEST
CHL CUP STRESS STAGE 1 DBP: 84 mmHg
CHL CUP STRESS STAGE 1 SBP: 176 mmHg
CHL CUP STRESS STAGE 2 DBP: 86 mmHg
CHL CUP STRESS STAGE 2 GRADE: 0 %
CHL CUP STRESS STAGE 2 SPEED: 0 mph
CHL CUP STRESS STAGE 3 HR: 97 {beats}/min
CHL CUP STRESS STAGE 4 HR: 133 {beats}/min
CHL CUP STRESS STAGE 4 SPEED: 1.7 mph
CHL CUP STRESS STAGE 5 DBP: 75 mmHg
CHL CUP STRESS STAGE 5 HR: 148 {beats}/min
CHL CUP STRESS STAGE 5 SBP: 114 mmHg
CHL CUP STRESS STAGE 6 GRADE: 12 %
CHL CUP STRESS STAGE 6 SPEED: 2.5 mph
CHL CUP STRESS STAGE 7 HR: 164 {beats}/min
CHL CUP STRESS STAGE 7 SBP: 157 mmHg
CHL CUP STRESS STAGE 8 DBP: 73 mmHg
CHL CUP STRESS STAGE 8 HR: 109 {beats}/min
CHL CUP STRESS STAGE 8 SBP: 174 mmHg
CHL CUP STRESS STAGE 8 SPEED: 0 mph
CSEPEW: 7 METS
Peak HR: 148 {beats}/min
Percent of predicted max HR: 90 %
Stage 1 Grade: 0 %
Stage 1 HR: 98 {beats}/min
Stage 1 Speed: 0 mph
Stage 2 HR: 98 {beats}/min
Stage 2 SBP: 170 mmHg
Stage 3 Grade: 0.1 %
Stage 3 Speed: 0 mph
Stage 4 Grade: 10 %
Stage 5 Grade: 12 %
Stage 5 Speed: 2.5 mph
Stage 6 HR: 148 {beats}/min
Stage 7 DBP: 50 mmHg
Stage 8 Grade: 0 %

## 2015-10-25 ENCOUNTER — Other Ambulatory Visit (HOSPITAL_COMMUNITY): Payer: Self-pay

## 2015-11-03 ENCOUNTER — Other Ambulatory Visit: Payer: Self-pay

## 2015-11-03 DIAGNOSIS — Z48812 Encounter for surgical aftercare following surgery on the circulatory system: Secondary | ICD-10-CM

## 2015-11-03 DIAGNOSIS — N186 End stage renal disease: Secondary | ICD-10-CM

## 2015-11-04 ENCOUNTER — Telehealth: Payer: Self-pay | Admitting: Surgery

## 2015-11-04 NOTE — Telephone Encounter (Signed)
-----   Message from Denman George, RN sent at 11/03/2015 11:09 AM EST ----- Regarding: needs post op f/u access duplex and appt. with Dr. Trula Slade Rec'd phone call from [facility] [place] re: pt's f/u appt. after AVF placement.  Please schedule for right arm access duplex and appt. with Dr. Trula Slade within next 1-2 weeks.  (s/p right B-C AVF 09/15/15)

## 2015-11-04 NOTE — Telephone Encounter (Signed)
Spoke with pt to schedule, dm

## 2015-11-11 ENCOUNTER — Ambulatory Visit (HOSPITAL_COMMUNITY)
Admission: RE | Admit: 2015-11-11 | Discharge: 2015-11-11 | Disposition: A | Discharge status: Home / Self Care | Payer: BLUE CROSS/BLUE SHIELD | Source: Ambulatory Visit | Attending: Surgery | Admitting: Surgery

## 2015-11-11 ENCOUNTER — Encounter: Payer: Self-pay | Admitting: Surgery

## 2015-11-11 DIAGNOSIS — I120 Hypertensive chronic kidney disease with stage 5 chronic kidney disease or end stage renal disease: Secondary | ICD-10-CM | POA: Insufficient documentation

## 2015-11-11 DIAGNOSIS — Z48812 Encounter for surgical aftercare following surgery on the circulatory system: Secondary | ICD-10-CM | POA: Diagnosis not present

## 2015-11-11 DIAGNOSIS — N186 End stage renal disease: Secondary | ICD-10-CM | POA: Diagnosis present

## 2015-11-11 DIAGNOSIS — E1122 Type 2 diabetes mellitus with diabetic chronic kidney disease: Secondary | ICD-10-CM | POA: Insufficient documentation

## 2015-11-11 DIAGNOSIS — E785 Hyperlipidemia, unspecified: Secondary | ICD-10-CM | POA: Diagnosis not present

## 2015-11-14 ENCOUNTER — Encounter (HOSPITAL_COMMUNITY): Payer: Self-pay

## 2015-11-18 ENCOUNTER — Encounter: Payer: Self-pay | Admitting: Surgery

## 2015-11-18 ENCOUNTER — Ambulatory Visit (INDEPENDENT_AMBULATORY_CARE_PROVIDER_SITE_OTHER): Payer: BLUE CROSS/BLUE SHIELD | Admitting: Surgery

## 2015-11-18 VITALS — BP 152/86 | HR 101 | Temp 97.9°F | Resp 18 | Ht 73.0 in | Wt 308.0 lb

## 2015-11-18 DIAGNOSIS — N186 End stage renal disease: Secondary | ICD-10-CM

## 2015-11-18 NOTE — Progress Notes (Signed)
Patient name: Robert [name] MRN: [id number] DOB: 1961/06/24 Sex: male     Chief Complaint  Patient presents with  . New Evaluation    evaluate for access    HISTORY OF PRESENT ILLNESS:  the patient is here for follow-up.  He is status post right  Brachiocephalic fistula creation on 09/15/2015.  He has no complaints of steal.  He has no arm swelling  Past Medical History  Diagnosis Date  . Thyroid disease   . Hyperlipidemia   . PAT (paroxysmal atrial tachycardia) ([place])   . PSVT (paroxysmal supraventricular tachycardia) ([place])   . Blood transfusion without reported diagnosis 1974    kidney removed - L  . CPAP (continuous positive airway pressure) dependence   . Dysrhythmia   . Shortness of breath   . Hypothyroidism   . GERD (gastroesophageal reflux disease)     n/v a lot  . Headache(784.0)     slight dull h/a due kidney failure  . Anemia     low hgb at present  . Umbilical hernia     will repair with thi surgery  . Neuropathy (HCC)     right hand and both feet  . Constipation   . Macular degeneration   . Eczema   . Sleep apnea 06/18/11    split-night sleep study- [facility] BiPAP- no oxygen  . Chronic kidney disease     Stage IV, Richarda Blade. M,W,F  . Cancer ([place])     skin ca right shoulder, plastic dsyplasia, pre-Ca polpys removed on Colonoscopy- 07/2014  . Hypertension     SVT h/o , followed by Dr. Claiborne Billings  . SBO (small bowel obstruction) ([place])   . Difficult intubation     unsure of actual problem but it was during the January 28, 2015 procedure.  . Diabetes mellitus without complication ([place])     Type 2    Past Surgical History  Procedure Laterality Date  . Nephrectomy Left 1974  . Renal biopsy Right 2012  . Skin cancer excision      right shoulder  . Capd insertion N/A 05/18/2014    Procedure: LAPAROSCOPIC INSERTION CONTINUOUS AMBULATORY PERITONEAL DIALYSIS  (CAPD) CATHETER;  Surgeon: Ralene Ok, MD;  Location: [place];   Service: General;  Laterality: N/A;  . Umbilical hernia repair N/A 05/18/2014    Procedure: HERNIA REPAIR UMBILICAL ADULT;  Surgeon: Ralene Ok, MD;  Location: [place];  Service: General;  Laterality: N/A;  . Laparoscopic repositioning capd catheter N/A 06/16/2014    Procedure: LAPAROSCOPIC REPOSITIONING CAPD CATHETER;  Surgeon: Ralene Ok, MD;  Location: [place];  Service: General;  Laterality: N/A;  . Ij catheter insertion    . Colonoscopy    . Insertion of dialysis catheter Right 02/25/2015    Procedure: INSERTION OF RIGHT INTERNAL JUGULAR DIALYSIS CATHETER;  Surgeon: Serafina Mitchell, MD;  Location: [place];  Service: Vascular;  Laterality: Right;  . Av fistula placement Right 02/25/2015    Procedure: RIGHT ARTERIOVENOUS (AV) FISTULA CREATION;  Surgeon: Serafina Mitchell, MD;  Location: [place] [facility];  Service: Vascular;  Laterality: Right;  . Umbilical hernia repair N/A 04/28/2015    Procedure: UMBILICAL HERNIA REPAIR WITH MESH;  Surgeon: Coralie Keens, MD;  Location: [place];  Service: General;  Laterality: N/A;  . Minor removal of peritoneal dialysis catheter N/A 04/28/2015    Procedure:  REMOVAL OF PERITONEAL DIALYSIS CATHETER;  Surgeon: Coralie Keens, MD;  Location: [place];  Service: General;  Laterality: N/A;  . Laparoscopy N/A 05/04/2015    Procedure: LAPAROSCOPY DIAGNOSTIC LYSIS OF ADHESIONS;  Surgeon: Coralie Keens, MD;  Location: [place];  Service: General;  Laterality: N/A;  . Revison of arteriovenous fistula Right 05/26/2015    Procedure: SUPERFICIALIZATION OF ARTERIOVENOUS FISTULA WITH SIDE BRANCH LIGATIONS;  Surgeon: Serafina Mitchell, MD;  Location: [place];  Service: Vascular;  Laterality: Right;  . Av fistula placement Right 09/15/2015    Procedure: ARTERIOVENOUS (AV) FISTULA CREATION- RIGHT ARM;  Surgeon: Serafina Mitchell, MD;  [facility];  Service: Vascular;  Laterality: Right;    Social History   Social History  . Marital Status: Married    Spouse Name: N/A  . Number of  Children: 3  . 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 Smoking status: Former Smoker    Types: Pipe    Quit date: 12/06/2013  . Smokeless tobacco: Never Used  . Alcohol Use: 0.6 oz/week    1 Glasses of wine per week     Comment: 1 glass of wine per week, rare use as of 05/25/15  . Drug Use: No  . Sexual Activity: Not on file   Other Topics Concern  . Not on file   Social History Narrative    Family History  Problem Relation Age of Onset  . Colon polyps Father   . Colon cancer Neg Hx   . Stomach cancer Neg Hx     Allergies as of 11/18/2015  . (No Known Allergies)    Current Outpatient Prescriptions on File Prior to Visit  Medication Sig Dispense Refill  . aspirin 81 MG tablet Take 81 mg by mouth every morning.     Marland Kitchen CALCITRIOL PO Take 75 mg by mouth 3 (three) times a week. Monday, Wednesday and Friday.    . cetirizine (ZYRTEC) 10 MG tablet Take 10 mg by mouth every morning.     . cinacalcet (SENSIPAR) 30 MG tablet Take 30 mg by mouth daily.    Marland Kitchen docusate sodium (COLACE) 100 MG capsule Take 100 mg by mouth daily.     . fluocinonide cream (LIDEX) [id number] % Apply 1 application topically daily as needed (Skin inflammation (hand, thigh, back)).     [name] 1000 MG chewable tablet Chew 1,000-2,000 mg by mouth 4 (four) times daily. 2000 mg with every meal and 1000 mg with snack    . glucose blood (ONETOUCH VERIO) test strip Use as instructed to check blood sugar 6 a day dx code E11.29 550 each 3  . Insulin Glargine (LANTUS SOLOSTAR) 100 UNIT/ML Solostar Pen Inject 15 Units into the skin every morning. (Patient taking differently: Inject 30 Units into the skin daily. In the morning) 15 mL 0  . insulin regular human CONCENTRATED (HUMULIN) 500 UNIT/ML injection Inject 6 units in the morning and 8 units in the pm (Patient taking differently: Inject 6-8 Units into the skin 2 (two) times daily. Inject 6 units in the morning and 8 units  in the pm) 2 pen 3  . Insulin Syringe-Needle U-100 (INSULIN SYRINGE 1CC/31GX5/16") 31G X 5/16" 1 ML MISC Use to inject insulin 100 each 3  . levothyroxine (SYNTHROID, LEVOTHROID) 100 MCG tablet TAKE 1 TABLET BY MOUTH EVERY DAY 90 tablet 1  . NOVOFINE 32G X 6 MM MISC USE 4 PEN NEEDLES PER DAY TO INJECT INSULIN 400 each 1  . polyethylene glycol (MIRALAX / GLYCOLAX) packet Take 17  g by mouth daily.    Marland Kitchen senna (SENOKOT) 8.6 MG tablet Take 1 tablet by mouth daily.    . calcium acetate (PHOSLO) 667 MG capsule Take 667 mg by mouth 3 (three) times daily with meals.    Marland Kitchen oxyCODONE-acetaminophen (PERCOCET/ROXICET) 5-325 MG tablet Take 1 tablet by mouth every 4 (four) hours as needed. (Patient not taking: Reported on 11/18/2015) 30 tablet 0   No current facility-administered medications on file prior to visit.      PHYSICAL EXAMINATION:   Vital signs are  Filed Vitals:   11/18/15 1431 11/18/15 1436  BP: 166/84 152/86  Pulse: 101 101  Temp: 97.9 F (36.6 C)   Resp: 18   Height: 6\' 1"  (1.854 m)   Weight: 308 lb (139.708 kg)   SpO2: 96%    Body mass index is 40.64 kg/(m^2). General: The patient appears their stated age.  excellent thrill and brachiocephalic fistula.  All incisions have healed   Diagnostic Studies  fistula ultrasound was done today diameter measurements are 0.66 and 0.89.   Depth measurements are from 0.39 up to 0.88 in the upper arm  Assessment:  end stage renal disease Plan:  I think the patient has an excellent thrill within his fistula that should be ready for use at the end of February.  I told him he could start cannulation in early March. If there is difficulty with cannulation , I would ascend this would be secondary to its depth. Elevation would be the next procedure, however I think that he should be fine with the existing fistula as it is easily palpable and not very deep near the antecubital crease.  Eldridge Abrahams, M.D. [facility] Office: [phone] Pager:  [phone]

## 2015-11-18 NOTE — Progress Notes (Signed)
Filed Vitals:   11/18/15 1431 11/18/15 1436  BP: 166/84 152/86  Pulse: 101 101  Temp: 97.9 F (36.6 C)   Resp: 18   Height: 6\' 1"  (1.854 m)   Weight: 308 lb (139.708 kg)   SpO2: 96%

## 2015-12-06 ENCOUNTER — Ambulatory Visit (INDEPENDENT_AMBULATORY_CARE_PROVIDER_SITE_OTHER): Payer: BLUE CROSS/BLUE SHIELD | Admitting: Podiatry

## 2015-12-06 ENCOUNTER — Encounter: Payer: Self-pay | Admitting: Podiatry

## 2015-12-06 DIAGNOSIS — E1149 Type 2 diabetes mellitus with other diabetic neurological complication: Secondary | ICD-10-CM | POA: Diagnosis not present

## 2015-12-06 DIAGNOSIS — B351 Tinea unguium: Secondary | ICD-10-CM | POA: Diagnosis not present

## 2015-12-06 DIAGNOSIS — M205X9 Other deformities of toe(s) (acquired), unspecified foot: Secondary | ICD-10-CM

## 2015-12-06 DIAGNOSIS — M79673 Pain in unspecified foot: Secondary | ICD-10-CM | POA: Diagnosis not present

## 2015-12-06 DIAGNOSIS — M2020 Hallux rigidus, unspecified foot: Secondary | ICD-10-CM

## 2015-12-06 NOTE — Progress Notes (Signed)
Patient ID: Robert [name], male   DOB: 1961-08-20, 55 y.o.   MRN: [id number] Complaint:  Visit Type: Patient returns to my office for continued preventative foot care services. Complaint: Patient states" my nails have grown long and thick and become painful to walk and wear shoes" Patient has been diagnosed with DM and is taking dialysis and neuropathy.  . The patient presents for preventative foot care services. No changes to ROS  Podiatric Exam: Vascular: dorsalis pedis and posterior tibial pulses are palpable bilateral. Capillary return is immediate. Temperature gradient is WNL. Skin turgor WNL  Sensorium: Diminished  Semmes Weinstein monofilament test. Normal tactile sensation bilaterally. Nail Exam: Pt has thick disfigured discolored nails with subungual debris noted bilateral entire nail hallux through fifth toenails Ulcer Exam: There is no evidence of ulcer or pre-ulcerative changes or infection. Orthopedic Exam: Muscle tone and strength are WNL. No limitations in general ROM. No crepitus or effusions noted. Foot type and digits show no abnormalities. Bony prominences are unremarkable. HAV  B/L Skin: No Porokeratosis. No infection or ulcers  Diagnosis:  Onychomycosis, , Pain in right toe, pain in left toes, Diabetic neuropathy  Hallux limitus 1st MPJ  B/L  Treatment & Plan Procedures and Treatment: Consent by patient was obtained for treatment procedures. The patient understood the discussion of treatment and procedures well. All questions were answered thoroughly reviewed. Debridement of mycotic and hypertrophic toenails, 1 through 5 bilateral and clearing of subungual debris. No ulceration, no infection noted. Initiate diabetic shoes with peripheral neuropathy and hallux limitus. Return Visit-Office Procedure: Patient instructed to return to the office for a follow up visit 3 months for continued evaluation and treatment.    Gardiner Barefoot DPM

## 2015-12-13 ENCOUNTER — Ambulatory Visit: Payer: BLUE CROSS/BLUE SHIELD | Admitting: Cardiovascular Disease

## 2015-12-15 ENCOUNTER — Other Ambulatory Visit (INDEPENDENT_AMBULATORY_CARE_PROVIDER_SITE_OTHER): Payer: BLUE CROSS/BLUE SHIELD

## 2015-12-15 DIAGNOSIS — E1165 Type 2 diabetes mellitus with hyperglycemia: Secondary | ICD-10-CM | POA: Diagnosis not present

## 2015-12-15 DIAGNOSIS — IMO0002 Reserved for concepts with insufficient information to code with codable children: Secondary | ICD-10-CM

## 2015-12-15 DIAGNOSIS — N186 End stage renal disease: Secondary | ICD-10-CM | POA: Diagnosis not present

## 2015-12-15 DIAGNOSIS — Z992 Dependence on renal dialysis: Secondary | ICD-10-CM

## 2015-12-15 DIAGNOSIS — E1122 Type 2 diabetes mellitus with diabetic chronic kidney disease: Secondary | ICD-10-CM

## 2015-12-15 DIAGNOSIS — Z794 Long term (current) use of insulin: Secondary | ICD-10-CM

## 2015-12-15 DIAGNOSIS — E063 Autoimmune thyroiditis: Secondary | ICD-10-CM

## 2015-12-15 DIAGNOSIS — E038 Other specified hypothyroidism: Secondary | ICD-10-CM

## 2015-12-15 LAB — TSH: TSH: 2.54 u[IU]/mL (ref 0.35–4.50)

## 2015-12-15 LAB — LIPID PANEL
Cholesterol: 219 mg/dL — ABNORMAL HIGH (ref 0–200)
HDL: 44.4 mg/dL (ref >39.00)
LDL Cholesterol: 147 mg/dL — ABNORMAL HIGH (ref 0–99)
NONHDL: 174.84
Total CHOL/HDL Ratio: 5
Triglycerides: 138 mg/dL (ref 0.0–149.0)
VLDL: 27.6 mg/dL (ref 0.0–40.0)

## 2015-12-15 LAB — T4, FREE: FREE T4: 1.05 ng/dL (ref 0.60–1.60)

## 2015-12-15 LAB — GLUCOSE, RANDOM: GLUCOSE: 152 mg/dL — AB (ref 70–99)

## 2015-12-15 LAB — HEMOGLOBIN A1C: HEMOGLOBIN A1C: 5.7 % (ref 4.6–6.5)

## 2015-12-16 ENCOUNTER — Other Ambulatory Visit: Payer: Self-pay | Admitting: Endocrinology

## 2015-12-19 ENCOUNTER — Other Ambulatory Visit: Payer: Self-pay | Admitting: *Deleted

## 2015-12-19 LAB — FRUCTOSAMINE: Fructosamine: 401 umol/L — ABNORMAL HIGH (ref 190–270)

## 2015-12-19 MED ORDER — INSULIN PEN NEEDLE 31G X 5 MM MISC: Status: DC

## 2015-12-20 ENCOUNTER — Encounter: Payer: Self-pay | Admitting: Endocrinology

## 2015-12-20 ENCOUNTER — Ambulatory Visit: Payer: Self-pay | Admitting: Endocrinology

## 2015-12-20 ENCOUNTER — Ambulatory Visit (INDEPENDENT_AMBULATORY_CARE_PROVIDER_SITE_OTHER): Payer: BLUE CROSS/BLUE SHIELD | Admitting: Endocrinology

## 2015-12-20 VITALS — BP 126/74 | HR 106 | Temp 99.0°F | Resp 14 | Ht 73.0 in | Wt 300.2 lb

## 2015-12-20 DIAGNOSIS — E785 Hyperlipidemia, unspecified: Secondary | ICD-10-CM

## 2015-12-20 DIAGNOSIS — E038 Other specified hypothyroidism: Secondary | ICD-10-CM | POA: Diagnosis not present

## 2015-12-20 DIAGNOSIS — E1165 Type 2 diabetes mellitus with hyperglycemia: Secondary | ICD-10-CM | POA: Diagnosis not present

## 2015-12-20 DIAGNOSIS — Z794 Long term (current) use of insulin: Secondary | ICD-10-CM | POA: Diagnosis not present

## 2015-12-20 DIAGNOSIS — E063 Autoimmune thyroiditis: Secondary | ICD-10-CM

## 2015-12-20 MED ORDER — ROSUVASTATIN CALCIUM 10 MG PO TABS: 10.0000 mg | ORAL_TABLET | Freq: Every day | ORAL | Status: DC

## 2015-12-20 NOTE — Patient Instructions (Addendum)
Take more insulin at lunch with higher carb intake and also use if needed at at [place] and sugars

## 2015-12-20 NOTE — Progress Notes (Signed)
Patient ID: Robert [name], male   DOB: 12/24/1960, 55 y.o.   MRN: [id number]   Reason for Appointment: Type II Diabetes follow-up   History of Present Illness   Diagnosis date:  2012  Previous history: His blood sugar has been difficult to control since onset. Initially was taking oral hypoglycemic drugs like glyburide but could not take metformin because of renal dysfunction. Since Victoza did not help his control he was started on Lantus and then U 500 insulin also His A1c has previously ranged from 7.8-9.8, the lowest level in 03/2013 He was on Victoza but because of his markedly decreased appetite and weight loss this was stopped in 9/15  Recent history:   Insulin regimen: LANTUS 30 units In a.m.  U-500 insulin with syringe: 8-10 units at lunch, 8-20 units acs  His A1c is usually lower than expected for his blood sugars However fructosamine is significantly higher than before and now 401  Current blood sugar patterns, management and problems identified:  Fasting blood sugars are fairly good with only one low normal reading of 67  He is usually not compliant with taking any insulin if he is eating breakfast which is mostly at work and may consist sometimes of doughnuts or [place]  He is trying to estimate his mealtime coverage at lunch and supper based on what he is eating but is not able to control it consistently  Occasionally will have alcoholic drinks that would raise his sugar also in the afternoon  Most of his readings late in the evening are fairly good  Generally does not overestimate his insulin and has only one low sugar of 62 right after lunch  Usually trying to take his insulin 30 minutes before eating if possible  Weight is difficult to assess because of his fluid status being variable but may be slightly better  His trying to walk a little more than usual  Proper timing of medications in relation to meals: Yes.          Monitors blood glucose:  Once a day or less.    Glucometer: One Touch.          Blood Glucose readings  Mean values apply above for all meters except median for One Touch  PRE-MEAL Fasting Lunch Dinner Bedtime Overall  Glucose range:  67-144   62-230    84-178    Mean/median:  110     151  141   POST-MEAL PC Breakfast PC Lunch PC Dinner  Glucose range:   101-306   148-231   Mean/median:          Meals: 2-3 meals per day. 8 am and Noon and Dinner 8-9 PM .  Is on the low phosphorus diet and has fewer carbohydrates, not eating cereals        Physical activity: exercise: some walking 3/7           Weight control:  Wt Readings from Last 3 Encounters:  12/20/15 300 lb 3.2 oz (136.17 kg)  11/18/15 308 lb (139.708 kg)  09/21/15 304 lb 6.4 oz (138.075 kg)          Complications:   nephropathy, neuropathy   Diabetes labs:   Lab Results  Component Value Date   HGBA1C 5.7 12/15/2015   HGBA1C 5.8 09/16/2015   HGBA1C 5.4 06/23/2015   Lab Results  Component Value Date   MICROALBUR 224.5 Repeated and verified X2.* 01/04/2014   LDLCALC 147* 12/15/2015   CREATININE 10.76* 05/13/2015  Medication List       This list is accurate as of: 12/20/15 11:31 AM.  Always use your most recent med list.               aspirin 81 MG tablet  Take 81 mg by mouth every morning.     CALCITRIOL PO  Take 75 mg by mouth 3 (three) times a week. Monday, Wednesday and Friday.     calcium acetate 667 MG capsule  Commonly known as:  PHOSLO  Take 667 mg by mouth 3 (three) times daily with meals.     cetirizine 10 MG tablet  Commonly known as:  ZYRTEC  Take 10 mg by mouth every morning.     cinacalcet 30 MG tablet  Commonly known as:  SENSIPAR  Take 30 mg by mouth daily.     docusate sodium 100 MG capsule  Commonly known as:  COLACE  Take 100 mg by mouth daily.     fluocinonide cream 0.05 %  Commonly known as:  LIDEX  Apply 1 application topically daily as needed (Skin inflammation (hand, thigh, back)).       FOSRENOL 1000 MG chewable tablet  Generic drug:  lanthanum  Chew 1,000-2,000 mg by mouth 4 (four) times daily. 2000 mg with every meal and 1000 mg with snack     glucose blood test strip  Commonly known as:  ONETOUCH VERIO  Use as instructed to check blood sugar 6 a day dx code E11.29     Insulin Pen Needle 31G X 5 MM Misc  Use 4 per day to inject insulin.     insulin regular human CONCENTRATED 500 UNIT/ML kwikpen  Commonly known as:  HUMULIN R  Inject 6 units in the morning and 8 units in the pm     INSULIN SYRINGE 1CC/31GX5/16" 31G X 5/16" 1 ML Misc  Use to inject insulin     LANTUS SOLOSTAR 100 UNIT/ML Solostar Pen  Generic drug:  Insulin Glargine  INJECT 30 UNITS INTO THE SKIN EVERY MORNING.     levothyroxine 100 MCG tablet  Commonly known as:  SYNTHROID, LEVOTHROID  TAKE 1 TABLET BY MOUTH EVERY DAY     polyethylene glycol packet  Commonly known as:  MIRALAX / GLYCOLAX  Take 17 g by mouth daily.     rosuvastatin 10 MG tablet  Commonly known as:  CRESTOR  Take 1 tablet (10 mg total) by mouth daily.     senna 8.6 MG tablet  Commonly known as:  SENOKOT  Take 1 tablet by mouth daily.        Allergies: No Known Allergies  Past Medical History  Diagnosis Date  . Thyroid disease   . Hyperlipidemia   . PAT (paroxysmal atrial tachycardia) ([place])   . PSVT (paroxysmal supraventricular tachycardia) ([place])   . Blood transfusion without reported diagnosis 1974    kidney removed - L  . CPAP (continuous positive airway pressure) dependence   . Dysrhythmia   . Shortness of breath   . Hypothyroidism   . GERD (gastroesophageal reflux disease)     n/v a lot  . Headache(784.0)     slight dull h/a due kidney failure  . Anemia     low hgb at present  . Umbilical hernia     will repair with thi surgery  . Neuropathy (HCC)     right hand and both feet  . Constipation   . Macular degeneration   . Eczema   . Sleep  apnea 06/18/11    split-night sleep study- [facility] BiPAP- no oxygen  . Chronic kidney disease     Stage IV, Richarda Blade. M,W,F  . Cancer ([place])     skin ca right shoulder, plastic dsyplasia, pre-Ca polpys removed on Colonoscopy- 07/2014  . Hypertension     SVT h/o , followed by Dr. Claiborne Billings  . SBO (small bowel obstruction) ([place])   . Difficult intubation     unsure of actual problem but it was during the January 28, 2015 procedure.  . Diabetes mellitus without complication ([place])     Type 2    Past Surgical History  Procedure Laterality Date  . Nephrectomy Left 1974  . Renal biopsy Right 2012  . Skin cancer excision      right shoulder  . Capd insertion N/A 05/18/2014    Procedure: LAPAROSCOPIC INSERTION CONTINUOUS AMBULATORY PERITONEAL DIALYSIS  (CAPD) CATHETER;  Surgeon: Ralene Ok, MD;  Location: [place];  Service: General;  Laterality: N/A;  . Umbilical hernia repair N/A 05/18/2014    Procedure: HERNIA REPAIR UMBILICAL ADULT;  Surgeon: Ralene Ok, MD;  Location: [place];  Service: General;  Laterality: N/A;  . Laparoscopic repositioning capd catheter N/A 06/16/2014    Procedure: LAPAROSCOPIC REPOSITIONING CAPD CATHETER;  Surgeon: Ralene Ok, MD;  Location: [place];  Service: General;  Laterality: N/A;  . Ij catheter insertion    . Colonoscopy    . Insertion of dialysis catheter Right 02/25/2015    Procedure: INSERTION OF RIGHT INTERNAL JUGULAR DIALYSIS CATHETER;  Surgeon: Serafina Mitchell, MD;  Location: [place];  Service: Vascular;  Laterality: Right;  . Av fistula placement Right 02/25/2015    Procedure: RIGHT ARTERIOVENOUS (AV) FISTULA CREATION;  Surgeon: Serafina Mitchell, MD;  Location: [place] [facility];  Service: Vascular;  Laterality: Right;  . Umbilical hernia repair N/A 04/28/2015    Procedure: UMBILICAL HERNIA REPAIR WITH MESH;  Surgeon: Coralie Keens, MD;  Location: [place];  Service: General;  Laterality: N/A;  . Minor removal of peritoneal dialysis catheter N/A 04/28/2015    Procedure:  REMOVAL OF PERITONEAL  DIALYSIS CATHETER;  Surgeon: Coralie Keens, MD;  Location: [place];  Service: General;  Laterality: N/A;  . Laparoscopy N/A 05/04/2015    Procedure: LAPAROSCOPY DIAGNOSTIC LYSIS OF ADHESIONS;  Surgeon: Coralie Keens, MD;  Location: [place];  Service: General;  Laterality: N/A;  . Revison of arteriovenous fistula Right 05/26/2015    Procedure: SUPERFICIALIZATION OF ARTERIOVENOUS FISTULA WITH SIDE BRANCH LIGATIONS;  Surgeon: Serafina Mitchell, MD;  Location: [place];  Service: Vascular;  Laterality: Right;  . Av fistula placement Right 09/15/2015    Procedure: ARTERIOVENOUS (AV) FISTULA CREATION- RIGHT ARM;  Surgeon: Serafina Mitchell, MD;  [facility];  Service: Vascular;  Laterality: Right;    Family History  Problem Relation Age of Onset  . Colon polyps Father   . Colon cancer Neg Hx   . Stomach cancer Neg Hx     Social History:  reports that he quit smoking about 2 years ago. His smoking use included Pipe. He has never used smokeless tobacco. He reports that he drinks about 0.6 oz of alcohol per week. He reports that he does not use illicit drugs.  Review of Systems:  Hypertension:  BP is variably controlled and he tends to have low readings during dialysis sometimes  His blood pressure has been managed by his nephrologist   Lipids: He has much better levels previously, not clear why  his Crestor was stopped His LDL is significantly high. Has no evidence of vascular disease    Lab Results  Component Value Date   CHOL 219* 12/15/2015   CHOL 87 09/20/2014   CHOL 113 08/09/2014   Lab Results  Component Value Date   HDL 44.40 12/15/2015   HDL 36.20* 09/20/2014   HDL 40 08/09/2014   Lab Results  Component Value Date   LDLCALC 147* 12/15/2015   LDLCALC 34 09/20/2014   LDLCALC 55 08/09/2014   Lab Results  Component Value Date   TRIG 138.0 12/15/2015   TRIG 83.0 09/20/2014   TRIG 89 08/09/2014   Lab Results  Component Value Date   CHOLHDL 5 12/15/2015   CHOLHDL 2  09/20/2014   CHOLHDL 2.8 08/09/2014   No results found for: LDLDIRECT   Thyroid: He has had long-standing hypothyroidism   TSH is normal again.  Has been taking his 100 g levothyroxine before breakfast consistently   Lab Results  Component Value Date   TSH 2.54 12/15/2015    He is on waiting list for  renal transplant and is going to [facility] for this    HYPOGONADISM: 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 metabolic syndrome and had subjectively improved with supplementation using Fortesta He has been off testosterone for several months and free testosterone is normal  Lab Results  Component Value Date   TESTOSTERONE 308* 09/16/2015     Diabetic neuropathy: Has mild sensory loss on his last exam from podiatrist   Examination:   BP 126/74 mmHg  Pulse 106  Temp(Src) 99 F (37.2 C)  Resp 14  Ht 6\' 1"  (1.854 m)  Wt 300 lb 3.2 oz (136.17 kg)  BMI 39.62 kg/m2  SpO2 96%  Body mass index is 39.62 kg/(m^2).    ASSESSMENT/ PLAN:    Diabetes type 2 with obesity:  See history of present illness for detailed discussion of his current management, problems identified and sugar patterns lately His blood sugars have been somewhat higher overall as judged by his fructosamine 401 As discussed above he has some tendency to postprandial hyperglycemia based on his diet Although he is usually compliant with taking his insulin before meals he does not do so at breakfast if he is eating at work Also not able to consistently cover his meals when eating out based on his carbohydrate, fat and alcohol intake at those meals Usually able to control postprandial readings after supper as judged by his recent download  He is trying to lose some weight but is not able to exercise as much as he needs to, I encouraged him to walk some every day He does not want to use the pen for the U-500 insulin because of the cost but he can use his current supply when he goes out to  eat Advised him to cover his breakfast meals more consistently with the mealtime doses even though he is taking Lantus in the morning  Since fasting readings are mostly near normal he will continue the same dose of Lantus once a day  HYPOTHYROIDISM: We will need to continue the same dose  HYPERLIPIDEMIA: Poorly controlled and he will need to start back on Crestor, will start with 10 mg  Follow-up in 3 months again with repeat lipids  Patient Instructions  Take more insulin at lunch with higher carb intake and also use if needed at at [place] and sugars    Counseling time on subjects discussed above is  over 50% of today's 25 minute visit   Merrel Crabbe 12/20/2015, 11:31 AM

## 2015-12-31 ENCOUNTER — Other Ambulatory Visit: Payer: Self-pay | Admitting: Endocrinology

## 2016-01-04 ENCOUNTER — Telehealth: Payer: Self-pay

## 2016-01-04 NOTE — Telephone Encounter (Signed)
Pt. Called to report onset of "severe steal" with right hand /fingers cool, numb, and cramping toward last 1.5 hr. of HD.  Reported the Tech changed from sticking the AVF with a "1 & 1" needle system, to a "2 & 2" needle system; and noted the steal symptoms became worse, with the latter approach.  Stated he can't tolerate the pain.  Appt. given for 3:45 PM 01/05/16.  Agreed.

## 2016-01-05 ENCOUNTER — Telehealth: Payer: Self-pay | Admitting: Endocrinology

## 2016-01-05 ENCOUNTER — Encounter: Payer: Self-pay | Admitting: Vascular Surgery

## 2016-01-05 ENCOUNTER — Ambulatory Visit (INDEPENDENT_AMBULATORY_CARE_PROVIDER_SITE_OTHER): Payer: BLUE CROSS/BLUE SHIELD | Admitting: Vascular Surgery

## 2016-01-05 VITALS — BP 145/70 | HR 110 | Temp 98.0°F | Resp 18 | Ht 72.25 in | Wt 306.8 lb

## 2016-01-05 DIAGNOSIS — N186 End stage renal disease: Secondary | ICD-10-CM

## 2016-01-05 DIAGNOSIS — Z992 Dependence on renal dialysis: Secondary | ICD-10-CM | POA: Diagnosis not present

## 2016-01-05 NOTE — Telephone Encounter (Signed)
Mickel Baas from [place] is checking on a Prior Auth for medication Lantus solostar [id number]

## 2016-01-05 NOTE — Progress Notes (Signed)
POST OPERATIVE OFFICE NOTE    CC:  F/u for surgery  HPI:  This is a 55 y.o. male who is s/p Right BC fistula creation 09/15/2015 by Dr. Trula Slade. It has been used for HD since early March.  He has had progressive numbness and pain in his hand when on HD.  He is now having to take ibuprofen 800 mg q 6 hours and even had to come off the machine early one day.   He is here today to go over options to help him tolerate the HD sessions.    No Known Allergies  Current Outpatient Prescriptions  Medication Sig Dispense Refill  . aspirin 81 MG tablet Take 81 mg by mouth every morning.     Marland Kitchen CALCITRIOL PO Take 75 mg by mouth 3 (three) times a week. Monday, Wednesday and Friday.    . calcium acetate (PHOSLO) 667 MG capsule Take 667 mg by mouth 3 (three) times daily with meals.    . cetirizine (ZYRTEC) 10 MG tablet Take 10 mg by mouth every morning.     . cinacalcet (SENSIPAR) 30 MG tablet Take 60 mg by mouth daily.     Marland Kitchen docusate sodium (COLACE) 100 MG capsule Take 100 mg by mouth daily.     . fluocinonide cream (LIDEX) [id number] % Apply 1 application topically daily as needed (Skin inflammation (hand, thigh, back)).     [name] 1000 MG chewable tablet Chew 1,000-2,000 mg by mouth 4 (four) times daily. 2000 mg with every meal and 1000 mg with snack    . glucose blood (ONETOUCH VERIO) test strip Use as instructed to check blood sugar 6 a day dx code E11.29 550 each 3  . Insulin Pen Needle 31G X 5 MM MISC Use 4 per day to inject insulin. 400 each 1  . insulin regular human CONCENTRATED (HUMULIN) 500 UNIT/ML injection Inject 6 units in the morning and 8 units in the pm (Patient taking differently: Inject 12-18 Units into the skin 2 (two) times daily. ) 2 pen 3  . Insulin Syringe-Needle U-100 (INSULIN SYRINGE 1CC/31GX5/16") 31G X 5/16" 1 ML MISC Use to inject insulin 100 each 3  . LANTUS SOLOSTAR 100 UNIT/ML Solostar Pen INJECT 30 UNITS INTO THE SKIN EVERY MORNING. 45 mL 1  . levothyroxine (SYNTHROID,  LEVOTHROID) 100 MCG tablet TAKE 1 TABLET BY MOUTH EVERY DAY 90 tablet 1  . polyethylene glycol (MIRALAX / GLYCOLAX) packet Take 17 g by mouth daily.    . rosuvastatin (CRESTOR) 10 MG tablet Take 1 tablet (10 mg total) by mouth daily. 90 tablet 3  . senna (SENOKOT) 8.6 MG tablet Take 1 tablet by mouth daily.    Marland Kitchen LANTUS SOLOSTAR 100 UNIT/ML Solostar Pen INJECT 30 UNITS INTO THE SKIN EVERY MORNING. (Patient not taking: Reported on 01/05/2016) 45 mL 1   No current facility-administered medications for this visit.     ROS:  See HPI  Physical Exam:  Filed Vitals:   01/05/16 1556  BP: 145/70  Pulse: 110  Temp: 98 F (36.7 C)  Resp: 18    Incision:  Well healed Extremities:  Palpable radial pulse and palpable fistula thrill distal half, deeper thrill to palpation on the proximal 1/2.   Neuro: decreased sensation to the ulnar nerve distribution on the right compared to his left.   Assessment/Plan:  This is a 55 y.o. male who is s/p: Right brachial cephalic fistula on HD for 3-4 weeks with numbness and pain in the right hand.  He doesn't have and muscle waisting or ulcers.  We will have him work with the HD lab for 2 more weeks. His options will be ligate the fistula and start over with new right UE access graft verse left UE access.    He discussed this with Dr. Darnelle Going agrees to the plan    Theda Sers [facility] PA-C [facility] [phone]  Clinic MD:  Pt seen and examined with Dr. Oneida Alar  History and exam findings as above. Patient has symptoms primarily of numbness and tingling in the right hand when he is not on dialysis. This converts to pain when he is on dialysis. They're having difficulty cannulating the fistula as well. I discussed with the patient today options of ligating the fistula and starting) the left upper extremity. He wishes to see if he can go for a few more weeks and have any improvement of his symptoms. If we decide a reliable his fistula  long-term consideration will also need to be given for Superficialization if it continues to be difficult with cannulation but I would only consider this if we do not really have any significant steal symptoms  [name], MD [facility] [place]: [phone] Pager: [phone]

## 2016-01-06 ENCOUNTER — Other Ambulatory Visit: Payer: Self-pay | Admitting: *Deleted

## 2016-01-06 NOTE — Telephone Encounter (Signed)
I spoke with the pharmacy, this has already been changed to tresiba.

## 2016-01-10 ENCOUNTER — Encounter: Payer: Self-pay | Admitting: Vascular Surgery

## 2016-01-19 ENCOUNTER — Encounter: Payer: Self-pay | Admitting: Vascular Surgery

## 2016-01-19 ENCOUNTER — Ambulatory Visit (INDEPENDENT_AMBULATORY_CARE_PROVIDER_SITE_OTHER): Payer: BLUE CROSS/BLUE SHIELD | Admitting: Vascular Surgery

## 2016-01-19 VITALS — BP 134/78 | HR 110 | Ht 72.25 in | Wt 308.9 lb

## 2016-01-19 DIAGNOSIS — T829XXD Unspecified complication of cardiac and vascular prosthetic device, implant and graft, subsequent encounter: Secondary | ICD-10-CM

## 2016-01-19 NOTE — Progress Notes (Signed)
CC:  poorly maturing AV fistula HPI:  This is a 55 y.o. male who is s/p Right BC fistula creation 09/15/2015 by Dr. Trula Slade. It has been used for HD since early March.  He has had progressive numbness and pain in his hand when on HD.  He was taking ibuprofen but has now been started on tramadol by Dr. Lorrene Reid. He states this helps considerably. He states the pain in his right hand is tolerable at this point. He states they're still having difficulty sticking his fistula. He has a catheter on the right side. Previous ultrasound showed the fistula 6-8 mm in diameter.  No Known Allergies    Current Outpatient Prescriptions   Medication  Sig  Dispense  Refill   .  aspirin 81 MG tablet  Take 81 mg by mouth every morning.        Marland Kitchen  CALCITRIOL PO  Take 75 mg by mouth 3 (three) times a week. Monday, Wednesday and Friday.       .  calcium acetate (PHOSLO) 667 MG capsule  Take 667 mg by mouth 3 (three) times daily with meals.       .  cetirizine (ZYRTEC) 10 MG tablet  Take 10 mg by mouth every morning.        .  cinacalcet (SENSIPAR) 30 MG tablet  Take 60 mg by mouth daily.        Marland Kitchen  docusate sodium (COLACE) 100 MG capsule  Take 100 mg by mouth daily.        .  fluocinonide cream (LIDEX) [id number] %  Apply 1 application topically daily as needed (Skin inflammation (hand, thigh, back)).        [name] 1000 MG chewable tablet  Chew 1,000-2,000 mg by mouth 4 (four) times daily. 2000 mg with every meal and 1000 mg with snack       .  glucose blood (ONETOUCH VERIO) test strip  Use as instructed to check blood sugar 6 a day dx code E11.29  550 each  3   .  Insulin Pen Needle 31G X 5 MM MISC  Use 4 per day to inject insulin.  400 each  1   .  insulin regular human CONCENTRATED (HUMULIN) 500 UNIT/ML injection  Inject 6 units in the morning and 8 units in the pm (Patient taking differently: Inject 12-18 Units into the skin 2 (two) times daily. )  2 pen  3   .  Insulin Syringe-Needle U-100 (INSULIN SYRINGE  1CC/31GX5/16") 31G X 5/16" 1 ML MISC  Use to inject insulin  100 each  3   .  LANTUS SOLOSTAR 100 UNIT/ML Solostar Pen  INJECT 30 UNITS INTO THE SKIN EVERY MORNING.  45 mL  1   .  levothyroxine (SYNTHROID, LEVOTHROID) 100 MCG tablet  TAKE 1 TABLET BY MOUTH EVERY DAY  90 tablet  1   .  polyethylene glycol (MIRALAX / GLYCOLAX) packet  Take 17 g by mouth daily.       .  rosuvastatin (CRESTOR) 10 MG tablet  Take 1 tablet (10 mg total) by mouth daily.  90 tablet  3   .  senna (SENOKOT) 8.6 MG tablet  Take 1 tablet by mouth daily.       Marland Kitchen  LANTUS SOLOSTAR 100 UNIT/ML Solostar Pen  INJECT 30 UNITS INTO THE SKIN EVERY MORNING. (Patient not taking: Reported on 01/05/2016)  45 mL  1      No  current facility-administered medications for this visit.      ROS:  See HPI  Physical Exam:    Filed Vitals:   01/19/16 1534  BP: 134/78  Pulse: 110  Height: 6' 0.25" (1.835 m)  Weight: 308 lb 14.4 oz (140.116 kg)  SpO2: 96%    Incision:  Well healed Extremities:  Palpable radial pulse and palpable fistula thrill distal half, deeper thrill to palpation on the proximal 1/2.    Neuro: decreased sensation to the ulnar nerve distribution on the right compared to his left.   Assessment/Plan:  At this point the patient has decided that the pain symptoms in his right hand are tolerable. He would like to proceed with Superficialization of the fistula. This is scheduled for 02/07/2016. [name], MD [facility] [place] Office: [phone] Pager: [phone]

## 2016-01-23 ENCOUNTER — Telehealth: Payer: Self-pay

## 2016-01-23 NOTE — Telephone Encounter (Signed)
Phone call from nurse at [facility].  Reported that the pt. recently saw Dr. Oneida Alar on 01/19/16.  The pt. is reporting that the [facility] is not to stick his fistula, until the procedure on 02/07/16.  Nurse is requesting an order.  Confirmed that the pt. Is scheduled for Superficialization Right B-C AVF on 02/07/16.  Advised the nurse that it would be recommended not to stick the right arm AVF until released to do so, following his next procedure, since the patient c/o pain associated with HD.  Will fax note to the [facility].

## 2016-01-24 ENCOUNTER — Other Ambulatory Visit: Payer: Self-pay

## 2016-01-26 ENCOUNTER — Other Ambulatory Visit: Payer: Self-pay | Admitting: *Deleted

## 2016-01-26 ENCOUNTER — Telehealth: Payer: Self-pay | Admitting: Endocrinology

## 2016-01-26 MED ORDER — LEVOTHYROXINE SODIUM 100 MCG PO TABS: 100.0000 ug | ORAL_TABLET | Freq: Every day | ORAL | Status: DC

## 2016-01-26 NOTE — Telephone Encounter (Signed)
Pt needs his Levothyroxine called into the [facility] on [address] in [place].

## 2016-01-26 NOTE — Telephone Encounter (Signed)
rx sent

## 2016-02-01 ENCOUNTER — Ambulatory Visit (INDEPENDENT_AMBULATORY_CARE_PROVIDER_SITE_OTHER): Payer: BLUE CROSS/BLUE SHIELD | Admitting: Cardiovascular Disease

## 2016-02-01 ENCOUNTER — Encounter: Payer: Self-pay | Admitting: Cardiovascular Disease

## 2016-02-01 VITALS — BP 152/82 | HR 100 | Ht 73.0 in | Wt 308.8 lb

## 2016-02-01 DIAGNOSIS — G4733 Obstructive sleep apnea (adult) (pediatric): Secondary | ICD-10-CM

## 2016-02-01 DIAGNOSIS — I10 Essential (primary) hypertension: Secondary | ICD-10-CM | POA: Diagnosis not present

## 2016-02-01 DIAGNOSIS — Z992 Dependence on renal dialysis: Secondary | ICD-10-CM

## 2016-02-01 DIAGNOSIS — Z9989 Dependence on other enabling machines and devices: Secondary | ICD-10-CM

## 2016-02-01 DIAGNOSIS — E785 Hyperlipidemia, unspecified: Secondary | ICD-10-CM | POA: Diagnosis not present

## 2016-02-01 DIAGNOSIS — N186 End stage renal disease: Secondary | ICD-10-CM

## 2016-02-01 DIAGNOSIS — E039 Hypothyroidism, unspecified: Secondary | ICD-10-CM

## 2016-02-01 MED ORDER — NEBIVOLOL HCL 5 MG PO TABS: ORAL_TABLET | ORAL | Status: DC

## 2016-02-01 NOTE — Patient Instructions (Signed)
Your physician has recommended you make the following change in your medication:   1.)  Start new prescription for Bystolic 5 mg as directed on the bottle. this has been sent to your pharmacy.   We will be contacting [facility] to obtain a card to get a download.  Your physician wants you to follow-up in: 6 months or sooner if needed. You will receive a reminder letter in the mail two months in advance. If you don't receive a letter, please call our office to schedule the follow-up appointment.  If you need a refill on your cardiac medications before your next appointment, please call your pharmacy.

## 2016-02-03 ENCOUNTER — Encounter (HOSPITAL_COMMUNITY): Payer: Self-pay | Admitting: *Deleted

## 2016-02-03 ENCOUNTER — Encounter: Payer: Self-pay | Admitting: Cardiovascular Disease

## 2016-02-03 NOTE — Progress Notes (Addendum)
Mr Daker reports that Insulin regimen has changed and he will be speaking to Dr Dwyane Dee on Monday and will take Insulin as instructed by Dr Dwyane Dee.  Patient reports that CBG occasionally hit 180- but normally are in the low 100's. Mr Ando takes Bystolic in the evening.  I instructed patient to check CBG to check CBG and if it is less than 70 to treat it with Glucose Gel, Glucose tablets or 1/2 cup of clear juice like apple juice or cranberry juice, or 1/2 cup of regular soda. (not cream soda). I instructed patient to recheck CBG in 15 minutes and if CBG is not greater than 70, to  Call [phone] (pre- op). If it is before pre-op opens to retreat as before and recheck CBG in 15 minutes. I told patient to make note of time that liquid is taken and amount, that surgical time may have to be adjusted.

## 2016-02-03 NOTE — Progress Notes (Signed)
Patient ID: Robert [name], male   DOB: 1961-06-03, 55 y.o.   MRN: [id number]     HPI: Robert [name], is a 55 y.o. male who is a former patient of Dr. Terance Ice.  I saw him in October 2014 when he established care with me.  He presents for follow-up evaluation.  Robert [name]  has a complex medical history that includes a congenital nonfunctioning left kidney for which he underwent left nephrectomy at age 40. He has a history of obesity, diabetes mellitus, and has been demonstrated to have proteinuria of his remaining single kidney, being status post open renal biopsy in [name] in November 2010. This revealed focal segmental glomerular sclerosis. At that time he was seen by Dr. Venita Sheffield in the nephrology department at [facility]. He sees Dr. Moshe Cipro in [place] for his kidney insufficiency and has been on ARB therapy in the past in an attempt to reduce proteinuria. He sees Dr. Dwyane Dee for his diabetes mellitus. He also has a history of hypothyroidism, hyperlipidemia, anxiety and depression. He has a history of prior supraventricular tachycardia requiring hospitalization in 2007 as was felt to be possibly AV nodal reentrant rhythm.  In addition, the patient has a history of complex sleep apnea initially diagnosed in 2007 with an AHI at that time of 56.7 per hour and during REM sleep this increased to 65.7 per hour. At that time he dropped his oxygen 84% and had loud snoring. He had been on BiPAP therapy. He underwent a 5 year followup split-night study in 2012 which again confirmed severe sleep apnea with an AHI of 60.7 on the baseline portion of the study. He was unable to achieve REM sleep at that time. Oxygen saturation during non-REM sleep at 83%. He was titrated utilizing BiPAP up to a setting of 16/12 cm water pressure. He does admit to continued residual daytime sleepiness despite using his BiPAP therapy. Patient also notes leg swelling. He  also has had significant gain in weight and admits to a 50 pound weight gain since he last saw Dr. Rollene Fare almost 2 years ago. He has noticed significant blood pressure lability with blood pressures as high as 180/120. He had been recently taken off losartan as well as atenolol and has been treated with a systolic 10 mg as well as hydralazine 50 mg 2 times daily. His hyperlipidemia has been treated with Crestor 20 mg as well as setting up. He also has hypothyroidism on Synthroid replacement.   An echo Doppler study in August 2012 showed mild concentric LVH with normal systolic function and grade 1 diastolic dysfunction. He had trace mitral and tricuspid insufficiency as well as pulmonic insufficiency. A nuclear perfusion study in August 2012 showed normal perfusion.  [name], [name], [name].  He is on the [facility] transplant list.  In January 2017.  He had an stress echo at [facility] as part of his transplant evaluation.  I do not have the results of this evaluation.  He has had some difficulty with a steal syndrome in his right hand since he had AV fistulas inserted in the forearm and most recently now in the upper arm.  He continues to use CPAP with 100% compliance.  He has a ResMed S9 CPAP unit.  He obtains his CPAP supplies online because they are cheaper, but had seen advance home care.  He is using a Art therapist full face mask.  He presents for evaluation.   Past Medical History  Diagnosis Date  . Thyroid disease   . Hyperlipidemia   . PAT (paroxysmal atrial tachycardia) ([place])   . PSVT (paroxysmal supraventricular tachycardia) ([place])   . Blood transfusion without reported diagnosis 1974    kidney removed - L  . CPAP (continuous positive airway pressure) dependence   . Dysrhythmia   . Shortness of breath   . Hypothyroidism   . GERD (gastroesophageal reflux disease)     n/v a lot  . Headache(784.0)     slight dull h/a due kidney  failure  . Anemia     low hgb at present  . Umbilical hernia     will repair with thi surgery  . Neuropathy (HCC)     right hand and both feet  . Constipation   . Macular degeneration   . Eczema   . Sleep apnea 06/18/11    split-night sleep study- [facility] BiPAP- no oxygen  . Cancer ([place])     skin ca right shoulder, plastic dsyplasia, pre-Ca polpys removed on Colonoscopy- 07/2014  . Hypertension     SVT h/o , followed by Dr. Claiborne Billings  . SBO (small bowel obstruction) ([place])   . Difficult intubation     unsure of actual problem but it was during the January 28, 2015 procedure.  . Diabetes mellitus without complication (HCC)     Type 2  . ESRD (end stage renal disease) ([place])     hEMO mwf  . Arthritis     FEET    Past Surgical History  Procedure Laterality Date  . Nephrectomy Left 1974  . Renal biopsy Right 2012  . Skin cancer excision      right shoulder  . Capd insertion N/A 05/18/2014    Procedure: LAPAROSCOPIC INSERTION CONTINUOUS AMBULATORY PERITONEAL DIALYSIS  (CAPD) CATHETER;  Surgeon: Ralene Ok, MD;  Location: [facility];  Service: General;  Laterality: N/A;  . Umbilical hernia repair N/A 05/18/2014    Procedure: HERNIA REPAIR UMBILICAL ADULT;  Surgeon: Ralene Ok, MD;  Location: [facility];  Service: General;  Laterality: N/A;  . Laparoscopic repositioning capd catheter N/A 06/16/2014    Procedure: LAPAROSCOPIC REPOSITIONING CAPD CATHETER;  Surgeon: Ralene Ok, MD;  Location: [facility];  Service: General;  Laterality: N/A;  . Ij catheter insertion    . Colonoscopy    . Insertion of dialysis catheter Right 02/25/2015    Procedure: INSERTION OF RIGHT INTERNAL JUGULAR DIALYSIS CATHETER;  Surgeon: Serafina Mitchell, MD;  Location: [facility];  Service: Vascular;  Laterality: Right;  . Av fistula placement Right 02/25/2015    Procedure: RIGHT ARTERIOVENOUS (AV) FISTULA CREATION;  Surgeon: Serafina Mitchell, MD;  Location: [facility];  Service: Vascular;  Laterality:  Right;  . Umbilical hernia repair N/A 04/28/2015    Procedure: UMBILICAL HERNIA REPAIR WITH MESH;  Surgeon: Coralie Keens, MD;  Location: [facility];  Service: General;  Laterality: N/A;  . Minor removal of peritoneal dialysis catheter N/A 04/28/2015    Procedure:  REMOVAL OF PERITONEAL DIALYSIS CATHETER;  Surgeon: Coralie Keens, MD;  Location: [facility];  Service: General;  Laterality: N/A;  . Laparoscopy N/A 05/04/2015    Procedure: LAPAROSCOPY DIAGNOSTIC LYSIS OF ADHESIONS;  Surgeon: Coralie Keens, MD;  Location: [facility];  Service: General;  Laterality: N/A;  . Revison of arteriovenous fistula Right 05/26/2015    Procedure: SUPERFICIALIZATION OF ARTERIOVENOUS FISTULA WITH SIDE BRANCH LIGATIONS;  Surgeon: Serafina Mitchell, MD;  [facility];  Service: Vascular;  Laterality: Right;  . Av fistula placement Right 09/15/2015    Procedure: ARTERIOVENOUS (AV) FISTULA CREATION- RIGHT ARM;  Surgeon: Serafina Mitchell, MD;  Location: [facility];  Service: Vascular;  Laterality: Right;   except left nephrectomy in 1974.  No Known Allergies  Current Outpatient Prescriptions  Medication Sig Dispense Refill  . aspirin 325 MG tablet Take 325 mg by mouth daily.    . calcium acetate (PHOSLO) 667 MG capsule Take 667 mg by mouth 3 (three) times daily with meals.    . cetirizine (ZYRTEC) 10 MG tablet Take 10 mg by mouth every morning.     . docusate sodium (COLACE) 100 MG capsule Take 100 mg by mouth daily.     Marland Kitchen ethyl chloride spray USE PRE DIALYSIS AS NEEDED  10  . fluocinonide cream (LIDEX) 6.78 % Apply 1 application topically daily as needed (Skin inflammation (hand, thigh, back)).     [name] 1000 MG chewable tablet Chew 1,000-2,000 mg by mouth 4 (four) times daily. 2000 mg with every meal and 1000 mg with snack    . glucose blood (ONETOUCH VERIO) test strip Use as instructed to check blood sugar 6 a day dx code E11.29 550 each 3  . Insulin Degludec (TRESIBA FLEXTOUCH) 100 UNIT/ML SOPN Inject 15 Units into the  skin daily.    . Insulin Pen Needle 31G X 5 MM MISC Use 4 per day to inject insulin. 400 each 1  . insulin regular human CONCENTRATED (HUMULIN) 500 UNIT/ML injection Inject 6 units in the morning and 8 units in the pm (Patient taking differently: Inject 12-18 Units into the skin 2 (two) times daily. ) 2 pen 3  . Insulin Syringe-Needle U-100 (INSULIN SYRINGE 1CC/31GX5/16") 31G X 5/16" 1 ML MISC Use to inject insulin 100 each 3  . levothyroxine (SYNTHROID, LEVOTHROID) 100 MCG tablet Take 1 tablet (100 mcg total) by mouth daily. 90 tablet 1  . lidocaine-prilocaine (EMLA) cream APPLY SMALL AMOUNT TO DIALYSIS SITE 30 -60 MIUNTES PRIOR TO DIALYSIS 3X A WEEK  4  . polyethylene glycol (MIRALAX / GLYCOLAX) packet Take 17 g by mouth daily.    . rosuvastatin (CRESTOR) 10 MG tablet Take 1 tablet (10 mg total) by mouth daily. 90 tablet 3  . senna (SENOKOT) 8.6 MG tablet Take 1 tablet by mouth daily.    . SENSIPAR 60 MG tablet Take 60 mg by mouth daily.     . traMADol (ULTRAM) 50 MG tablet TAKE 2 TABLET BY MOUTH TWICE DAILY AS NEEDED FOR PAIN  1  . zolpidem (AMBIEN) 10 MG tablet Take 10 mg by mouth at bedtime as needed.  0  . calcitRIOL (ROCALTROL) 0.25 MCG capsule Take 0.75 mcg by mouth 3 (three) times a week. DURING DIALYSIS    . nebivolol (BYSTOLIC) 5 MG tablet Take 1 tablet daily on non dialysis days 30 tablet 3   No current facility-administered medications for this visit.   Social history is notable in that he is married for 27 years. His 3 children. 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 He does drink occasional alcohol. He has smoked cigars.  ROS General: Negative; No fevers, chills, or night sweats; positive for morbid obesity HEENT: Negative; No changes in vision or hearing, sinus congestion, difficulty swallowing Pulmonary: Negative; No cough, wheezing, shortness of breath, hemoptysis Cardiovascular: Negative; No chest pain, presyncope, syncope,  palpitations GI: Negative; No nausea, vomiting, diarrhea, or  abdominal pain GU: Negative; No dysuria, hematuria, or difficulty voiding Musculoskeletal: Negative; no myalgias, joint pain, or weakness Hematologic/Oncology: Negative; no easy bruising, bleeding Endocrine: Positive for diabetes and hypothyroidism Neuro: Negative; no changes in balance, headaches Skin: Negative; No rashes or skin lesions Psychiatric: Negative; No behavioral problems, depression Sleep: Positive for OSA on CPAP daytime sleepiness, hypersomnolence, bruxism, restless legs, hypnogognic hallucinations, no cataplexy Other comprehensive 14 point system review is negative.  PE BP 152/82 mmHg  Pulse 100  Ht '6\' 1"'$  (1.854 m)  Wt 308 lb 12.8 oz (140.071 kg)  BMI 40.75 kg/m2   Repeat blood pressure by me 146/78  Wt Readings from Last 3 Encounters:  02/01/16 308 lb 12.8 oz (140.071 kg)  01/19/16 308 lb 14.4 oz (140.116 kg)  01/05/16 306 lb 12.8 oz (139.164 kg)   General: Alert, oriented, no distress.;  Morbidly obese Skin: normal turgor, no rashes HEENT: Normocephalic, atraumatic. Pupils round and reactive; sclera anicteric; Fundi mild arteriolar narrowing without hemorrhages or exudates the Nose without nasal septal hypertrophy Mouth/Parynx benign; Mallinpatti scale 3 Neck: No JVD, no carotid bruits with normal carotid upstroke Lungs: clear to ausculatation and percussion; no wheezing or rales Heart: RRR, s1 s2 normal soft S4. 1/6 systolic murmur; no S3 gallop.  No rubs thrills or heaves.. Abdomen: soft, nontender; no hepatosplenomehaly, BS+; abdominal aorta nontender and not dilated by palpation. Pulses 2+; fistula in the right upper arm Extremities: 1-lower extremity edema; no clubbinbg cyanosis, Homan's sign negative  Neurologic: grossly nonfocal Psychologic: Normal affect and mood   ECG (independently read by me): Sinus tachycardia 100 bpm.  2.  TC interval 45 ms.   November 2014 ECG: Normal sinus 34 beats  per minute. PR interval 138 ms. QT interval 448 ms.  LABS: BMP Latest Ref Rng 12/15/2015 09/16/2015 09/15/2015  Glucose 70 - 99 mg/dL 152(H) 140(H) 75  BUN 6 - 20 mg/dL - - -  Creatinine 0.61 - 1.24 mg/dL - - -  Sodium 135 - 145 mmol/L - - 136  Potassium 3.5 - 5.1 mmol/L - - 4.1  Chloride 101 - 111 mmol/L - - -  CO2 22 - 32 mmol/L - - -  Calcium 8.9 - 10.3 mg/dL - - -   Hepatic Function Latest Ref Rng 05/13/2015 05/10/2015 05/05/2015  Total Protein 6.5 - 8.1 g/dL - 8.4(H) -  Albumin 3.5 - 5.0 g/dL 3.6 4.3 3.4(L)  AST 15 - 41 U/L - 32 -  ALT 17 - 63 U/L - 18 -  Alk Phosphatase 38 - 126 U/L - 85 -  Total Bilirubin 0.3 - 1.2 mg/dL - 0.6 -    CBC Latest Ref Rng 09/15/2015 05/26/2015 05/13/2015  WBC 4.0 - 10.5 K/uL - - 7.1  Hemoglobin 13.0 - 17.0 g/dL 11.6(L) 10.5(L) 9.5(L)  Hematocrit 39.0 - 52.0 % 34.0(L) 31.0(L) 28.9(L)  Platelets 150 - 400 K/uL - - 327    Lab Results  Component Value Date   MCV 96.7 05/13/2015   MCV 99.0 05/11/2015   MCV 99.4 05/10/2015   Lab Results  Component Value Date   TSH 2.54 12/15/2015   Lab Results  Component Value Date   HGBA1C 5.7 12/15/2015   Lipid Panel     Component Value Date/Time   CHOL 219* 12/15/2015 0744   TRIG 138.0 12/15/2015 0744   HDL 44.40 12/15/2015 0744   CHOLHDL 5 12/15/2015 0744   VLDL 27.6 12/15/2015 0744   LDLCALC 147* 12/15/2015 0744    RADIOLOGY: No results found.  ASSESSMENT AND PLAN: Mr. Kawano is a 55 year old Caucasian male is a history of morbid obesity, hypertension, diabetes mellitus, hyperlipidemia, and end-stage kidney disease, now on dialysis.  He has severe complex sleep apnea and has been on BiPAP therapy.  He admits to 100% compliance.  He has not had any recent download of his BiPAP unit.  When I last saw him he was on 16/12 pressure, but had residual daytime sleepiness.  His resting pulse today is 100.  I am electing to add Bystolic 5 mg but he will take this initially on nondialysis days  to avoid  potential hypotension.  I reviewed his recent laboratory.  I have recommended initiation of Crestor with his LDL of 147.  He states his dry weight is 137 kg. .  We discussed weight loss and exercise.  He denies cold or heat intolerance on his current dose of levothyroxine 100 g for hypothyroidism.  I will see him in 6 months for cardiology reevaluation.  Time spent: 25 minutes Troy Sine, MD, [facility]  02/03/2016 9:49 PM

## 2016-02-06 ENCOUNTER — Telehealth: Payer: Self-pay | Admitting: Endocrinology

## 2016-02-06 NOTE — Anesthesia Preprocedure Evaluation (Deleted)
Anesthesia Evaluation    Airway        Dental   Pulmonary former smoker,           Cardiovascular hypertension,      Neuro/Psych    GI/Hepatic   Endo/Other  diabetes  Renal/GU      Musculoskeletal   Abdominal   Peds  Hematology   Anesthesia Other Findings   Reproductive/Obstetrics                             Anesthesia Physical Anesthesia Plan Anesthesia Quick Evaluation  

## 2016-02-06 NOTE — Telephone Encounter (Signed)
Message left on patient's home phone. 

## 2016-02-06 NOTE — Telephone Encounter (Signed)
Please see below and advise.

## 2016-02-06 NOTE — Telephone Encounter (Signed)
Pt having vascular work done tomorrow is he to still take his meds as usual? Please advise

## 2016-02-06 NOTE — Telephone Encounter (Signed)
What kind of procedure and how long will he be not eating?  If he is just not eating in the morning he can skip the morning insulin

## 2016-02-07 ENCOUNTER — Telehealth: Payer: Self-pay | Admitting: Endocrinology

## 2016-02-07 ENCOUNTER — Ambulatory Visit (HOSPITAL_COMMUNITY): Payer: BLUE CROSS/BLUE SHIELD | Admitting: Certified Registered Nurse Anesthetist

## 2016-02-07 ENCOUNTER — Encounter (HOSPITAL_COMMUNITY)
Admission: RE | Disposition: A | Discharge status: Home / Self Care | Payer: Self-pay | Source: Ambulatory Visit | Attending: Vascular Surgery

## 2016-02-07 ENCOUNTER — Encounter (HOSPITAL_COMMUNITY): Payer: Self-pay | Admitting: *Deleted

## 2016-02-07 ENCOUNTER — Ambulatory Visit (HOSPITAL_COMMUNITY)
Admission: RE | Admit: 2016-02-07 | Discharge: 2016-02-07 | Disposition: A | Discharge status: Home / Self Care | Payer: BLUE CROSS/BLUE SHIELD | Source: Ambulatory Visit | Attending: Vascular Surgery | Admitting: Vascular Surgery

## 2016-02-07 DIAGNOSIS — Z87891 Personal history of nicotine dependence: Secondary | ICD-10-CM | POA: Diagnosis not present

## 2016-02-07 DIAGNOSIS — Z79899 Other long term (current) drug therapy: Secondary | ICD-10-CM | POA: Insufficient documentation

## 2016-02-07 DIAGNOSIS — E1122 Type 2 diabetes mellitus with diabetic chronic kidney disease: Secondary | ICD-10-CM | POA: Insufficient documentation

## 2016-02-07 DIAGNOSIS — Y832 Surgical operation with anastomosis, bypass or graft as the cause of abnormal reaction of the patient, or of later complication, without mention of misadventure at the time of the procedure: Secondary | ICD-10-CM | POA: Diagnosis not present

## 2016-02-07 DIAGNOSIS — Z794 Long term (current) use of insulin: Secondary | ICD-10-CM | POA: Diagnosis not present

## 2016-02-07 DIAGNOSIS — T82898A Other specified complication of vascular prosthetic devices, implants and grafts, initial encounter: Secondary | ICD-10-CM | POA: Insufficient documentation

## 2016-02-07 DIAGNOSIS — G4730 Sleep apnea, unspecified: Secondary | ICD-10-CM | POA: Diagnosis not present

## 2016-02-07 DIAGNOSIS — N186 End stage renal disease: Secondary | ICD-10-CM | POA: Diagnosis not present

## 2016-02-07 DIAGNOSIS — I120 Hypertensive chronic kidney disease with stage 5 chronic kidney disease or end stage renal disease: Secondary | ICD-10-CM | POA: Insufficient documentation

## 2016-02-07 DIAGNOSIS — Z7982 Long term (current) use of aspirin: Secondary | ICD-10-CM | POA: Diagnosis not present

## 2016-02-07 DIAGNOSIS — Z992 Dependence on renal dialysis: Secondary | ICD-10-CM | POA: Insufficient documentation

## 2016-02-07 HISTORY — PX: FISTULA SUPERFICIALIZATION: SHX6341

## 2016-02-07 HISTORY — DX: End stage renal disease: N18.6

## 2016-02-07 HISTORY — DX: Unspecified osteoarthritis, unspecified site: M19.90

## 2016-02-07 LAB — GLUCOSE, CAPILLARY
GLUCOSE-CAPILLARY: 31 mg/dL — AB (ref 65–99)
GLUCOSE-CAPILLARY: 38 mg/dL — AB (ref 65–99)
GLUCOSE-CAPILLARY: 93 mg/dL (ref 65–99)
GLUCOSE-CAPILLARY: 68 mg/dL (ref 65–99)

## 2016-02-07 LAB — POCT I-STAT 4, (NA,K, GLUC, HGB,HCT)
GLUCOSE: 177 mg/dL — AB (ref 65–99)
HEMATOCRIT: 29 % — AB (ref 39.0–52.0)
Hemoglobin: 9.9 g/dL — ABNORMAL LOW (ref 13.0–17.0)
POTASSIUM: 3.9 mmol/L (ref 3.5–5.1)
SODIUM: 139 mmol/L (ref 135–145)

## 2016-02-07 SURGERY — FISTULA SUPERFICIALIZATION
Anesthesia: General | Site: Arm Upper | Laterality: Right

## 2016-02-07 MED ORDER — HYDROMORPHONE HCL 1 MG/ML IJ SOLN
INTRAMUSCULAR | Status: AC
  Administered 2016-02-07: 0.25 mg via INTRAVENOUS
  Filled 2016-02-07: qty 1

## 2016-02-07 MED ORDER — 0.9 % SODIUM CHLORIDE (POUR BTL) OPTIME
TOPICAL | Status: DC | PRN
  Administered 2016-02-07: 1000 mL

## 2016-02-07 MED ORDER — MIDAZOLAM HCL 2 MG/2ML IJ SOLN
INTRAMUSCULAR | Status: AC
  Filled 2016-02-07: qty 2

## 2016-02-07 MED ORDER — DEXTROSE 50 % IV SOLN
INTRAVENOUS | Status: AC
  Administered 2016-02-07: 50 mL via INTRAVENOUS
  Filled 2016-02-07: qty 50

## 2016-02-07 MED ORDER — FENTANYL CITRATE (PF) 100 MCG/2ML IJ SOLN
INTRAMUSCULAR | Status: DC | PRN
  Administered 2016-02-07: 50 ug via INTRAVENOUS
  Administered 2016-02-07: 25 ug via INTRAVENOUS
  Administered 2016-02-07: 100 ug via INTRAVENOUS
  Administered 2016-02-07: 25 ug via INTRAVENOUS
  Administered 2016-02-07: 50 ug via INTRAVENOUS

## 2016-02-07 MED ORDER — THROMBIN 20000 UNITS EX SOLR
CUTANEOUS | Status: AC
  Filled 2016-02-07: qty 20000

## 2016-02-07 MED ORDER — LIDOCAINE HCL (PF) 1 % IJ SOLN
INTRAMUSCULAR | Status: AC
  Filled 2016-02-07: qty 30

## 2016-02-07 MED ORDER — ALBUMIN HUMAN 5 % IV SOLN
INTRAVENOUS | Status: DC | PRN
  Administered 2016-02-07 (×2): via INTRAVENOUS

## 2016-02-07 MED ORDER — SODIUM CHLORIDE 0.9 % IV SOLN
INTRAVENOUS | Status: DC | PRN
  Administered 2016-02-07: 500 mL

## 2016-02-07 MED ORDER — ONDANSETRON HCL 4 MG/2ML IJ SOLN
INTRAMUSCULAR | Status: DC | PRN
  Administered 2016-02-07: 4 mg via INTRAVENOUS

## 2016-02-07 MED ORDER — TRAMADOL HCL 50 MG PO TABS: 50.0000 mg | ORAL_TABLET | Freq: Four times a day (QID) | ORAL | Status: DC | PRN

## 2016-02-07 MED ORDER — HYDROMORPHONE HCL 1 MG/ML IJ SOLN
INTRAMUSCULAR | Status: AC
  Administered 2016-02-07: 0.5 mg via INTRAVENOUS
  Filled 2016-02-07: qty 1

## 2016-02-07 MED ORDER — LIDOCAINE HCL (CARDIAC) 20 MG/ML IV SOLN
INTRAVENOUS | Status: DC | PRN
  Administered 2016-02-07: 80 mg via INTRAVENOUS

## 2016-02-07 MED ORDER — SODIUM CHLORIDE 0.9 % IV SOLN: INTRAVENOUS | Status: DC

## 2016-02-07 MED ORDER — PROPOFOL 10 MG/ML IV BOLUS
INTRAVENOUS | Status: AC
  Filled 2016-02-07: qty 40

## 2016-02-07 MED ORDER — DEXTROSE 50 % IV SOLN
1.0000 | Freq: Once | INTRAVENOUS | Status: AC
  Administered 2016-02-07: 50 mL via INTRAVENOUS

## 2016-02-07 MED ORDER — HYDROMORPHONE HCL 1 MG/ML IJ SOLN: 0.2500 mg | INTRAMUSCULAR | Status: DC | PRN

## 2016-02-07 MED ORDER — PROPOFOL 10 MG/ML IV BOLUS
INTRAVENOUS | Status: DC | PRN
  Administered 2016-02-07: 30 mg via INTRAVENOUS
  Administered 2016-02-07: 160 mg via INTRAVENOUS
  Administered 2016-02-07: 20 mg via INTRAVENOUS

## 2016-02-07 MED ORDER — HYDROMORPHONE HCL 1 MG/ML IJ SOLN
0.2500 mg | INTRAMUSCULAR | Status: DC | PRN
  Administered 2016-02-07: 0.25 mg via INTRAVENOUS
  Administered 2016-02-07: 0.5 mg via INTRAVENOUS
  Administered 2016-02-07: 0.25 mg via INTRAVENOUS
  Administered 2016-02-07 (×2): 0.5 mg via INTRAVENOUS

## 2016-02-07 MED ORDER — FENTANYL CITRATE (PF) 250 MCG/5ML IJ SOLN
INTRAMUSCULAR | Status: AC
  Filled 2016-02-07: qty 5

## 2016-02-07 MED ORDER — SUCCINYLCHOLINE CHLORIDE 20 MG/ML IJ SOLN
INTRAMUSCULAR | Status: DC | PRN
  Administered 2016-02-07: 120 mg via INTRAVENOUS

## 2016-02-07 MED ORDER — SODIUM CHLORIDE 0.9 % IV SOLN
INTRAVENOUS | Status: DC | PRN
  Administered 2016-02-07 (×2): via INTRAVENOUS

## 2016-02-07 MED ORDER — PHENYLEPHRINE HCL 10 MG/ML IJ SOLN
10.0000 mg | INTRAMUSCULAR | Status: DC | PRN
  Administered 2016-02-07: 40 ug/min via INTRAVENOUS

## 2016-02-07 MED ORDER — HEPARIN SODIUM (PORCINE) 1000 UNIT/ML IJ SOLN
1000.0000 [IU] | Freq: Once | INTRAMUSCULAR | Status: AC
  Administered 2016-02-07: 1900 [IU] via INTRAVENOUS
  Filled 2016-02-07: qty 1

## 2016-02-07 MED ORDER — CHLORHEXIDINE GLUCONATE CLOTH 2 % EX PADS: 6.0000 | MEDICATED_PAD | Freq: Once | CUTANEOUS | Status: DC

## 2016-02-07 MED ORDER — MIDAZOLAM HCL 5 MG/5ML IJ SOLN
INTRAMUSCULAR | Status: DC | PRN
  Administered 2016-02-07: 1 mg via INTRAVENOUS

## 2016-02-07 MED ORDER — PROPOFOL 500 MG/50ML IV EMUL
INTRAVENOUS | Status: AC
  Filled 2016-02-07: qty 50

## 2016-02-07 MED ORDER — PHENYLEPHRINE HCL 10 MG/ML IJ SOLN
INTRAMUSCULAR | Status: DC | PRN
  Administered 2016-02-07 (×2): 200 ug via INTRAVENOUS
  Administered 2016-02-07 (×2): 120 ug via INTRAVENOUS
  Administered 2016-02-07: 40 ug via INTRAVENOUS

## 2016-02-07 MED ORDER — PROPOFOL 1000 MG/100ML IV EMUL
INTRAVENOUS | Status: AC
  Filled 2016-02-07: qty 100

## 2016-02-07 MED ORDER — DEXTROSE 50 % IV SOLN
INTRAVENOUS | Status: AC
  Filled 2016-02-07: qty 50

## 2016-02-07 MED ORDER — DEXTROSE 5 % IV SOLN
1.5000 g | INTRAVENOUS | Status: AC
  Administered 2016-02-07: 1.5 g via INTRAVENOUS
  Filled 2016-02-07: qty 1.5

## 2016-02-07 SURGICAL SUPPLY — 42 items
BANDAGE ELASTIC 4 VELCRO ST LF (GAUZE/BANDAGES/DRESSINGS) ×1 IMPLANT
BANDAGE ELASTIC 6 VELCRO ST LF (GAUZE/BANDAGES/DRESSINGS) ×1 IMPLANT
CANISTER SUCTION 2500CC (MISCELLANEOUS) ×2 IMPLANT
CANNULA VESSEL 3MM 2 BLNT TIP (CANNULA) ×1 IMPLANT
CLIP TI MEDIUM 6 (CLIP) ×3 IMPLANT
CLIP TI WIDE RED SMALL 6 (CLIP) ×2 IMPLANT
COVER PROBE W GEL 5X96 (DRAPES) IMPLANT
DRAIN PENROSE 1/4X12 LTX STRL (WOUND CARE) ×1 IMPLANT
ELECT REM PT RETURN 9FT ADLT (ELECTROSURGICAL) ×2
ELECTRODE REM PT RTRN 9FT ADLT (ELECTROSURGICAL) ×1 IMPLANT
GEL ULTRASOUND 20GR AQUASONIC (MISCELLANEOUS) IMPLANT
GLOVE BIO SURGEON STRL SZ 6.5 (GLOVE) ×1 IMPLANT
GLOVE BIO SURGEON STRL SZ7.5 (GLOVE) ×2 IMPLANT
GLOVE BIOGEL PI IND STRL 6.5 (GLOVE) IMPLANT
GLOVE BIOGEL PI IND STRL 7.0 (GLOVE) IMPLANT
GLOVE BIOGEL PI IND STRL 7.5 (GLOVE) IMPLANT
GLOVE BIOGEL PI INDICATOR 6.5 (GLOVE) ×1
GLOVE BIOGEL PI INDICATOR 7.0 (GLOVE) ×2
GLOVE BIOGEL PI INDICATOR 7.5 (GLOVE) ×1
GLOVE ECLIPSE 7.0 STRL STRAW (GLOVE) ×1 IMPLANT
GOWN STRL REUS W/ TWL LRG LVL3 (GOWN DISPOSABLE) ×3 IMPLANT
GOWN STRL REUS W/TWL LRG LVL3 (GOWN DISPOSABLE) ×6
KIT BASIN OR (CUSTOM PROCEDURE TRAY) ×2 IMPLANT
KIT ROOM TURNOVER OR (KITS) ×2 IMPLANT
LIQUID BAND (GAUZE/BANDAGES/DRESSINGS) ×2 IMPLANT
LOOP VESSEL MINI RED (MISCELLANEOUS) IMPLANT
NS IRRIG 1000ML POUR BTL (IV SOLUTION) ×2 IMPLANT
PACK CV ACCESS (CUSTOM PROCEDURE TRAY) ×2 IMPLANT
PAD ARMBOARD 7.5X6 YLW CONV (MISCELLANEOUS) ×4 IMPLANT
SPONGE SURGIFOAM ABS GEL 100 (HEMOSTASIS) IMPLANT
SUT PROLENE 5 0 C 1 24 (SUTURE) ×2 IMPLANT
SUT PROLENE 6 0 BV (SUTURE) ×1 IMPLANT
SUT PROLENE 7 0 BV 1 (SUTURE) ×1 IMPLANT
SUT VIC AB 2-0 SH 27 (SUTURE) ×2
SUT VIC AB 2-0 SH 27XBRD (SUTURE) IMPLANT
SUT VIC AB 3-0 SH 27 (SUTURE) ×4
SUT VIC AB 3-0 SH 27X BRD (SUTURE) ×1 IMPLANT
SUT VICRYL 4-0 PS2 18IN ABS (SUTURE) ×5 IMPLANT
SYR BULB IRRIGATION 50ML (SYRINGE) ×1 IMPLANT
TOWEL OR 17X24 6PK STRL BLUE (TOWEL DISPOSABLE) ×1 IMPLANT
UNDERPAD 30X30 INCONTINENT (UNDERPADS AND DIAPERS) ×2 IMPLANT
WATER STERILE IRR 1000ML POUR (IV SOLUTION) ×2 IMPLANT

## 2016-02-07 NOTE — Progress Notes (Signed)
Pt has HD catheter which was capped off by IV team.

## 2016-02-07 NOTE — Telephone Encounter (Signed)
I called this morning and spoke with his wife, he was already finished with his procedure.

## 2016-02-07 NOTE — Telephone Encounter (Signed)
He should stop the Lantus.  Tresiba dose will need to be adjusted based on his morning readings, if he was taking 30 units of Lantus he should be taking about 25 of Tresiba to start with

## 2016-02-07 NOTE — Transfer of Care (Signed)
Immediate Anesthesia Transfer of Care Note  Patient: Robert [name]  Procedure(s) Performed: Procedure(s): RIIGHT UPPER ARM FISTULA SUPERFICIALIZATION (Right)  Patient Location: PACU  Anesthesia Type:General  Level of Consciousness: awake, alert  and oriented  Airway & Oxygen Therapy: Patient Spontanous Breathing and Patient connected to face mask oxygen  Post-op Assessment: Report given to RN and Post -op Vital signs reviewed and stable  Post vital signs: Reviewed and stable  Last Vitals:  Filed Vitals:   02/07/16 0642  BP: 142/56  Pulse: 84  Temp: 36.7 C  Resp: 20    Last Pain: There were no vitals filed for this visit.    Patients Stated Pain Goal: 1 ([id number])  Complications: No apparent anesthesia complications

## 2016-02-07 NOTE — Anesthesia Postprocedure Evaluation (Signed)
Anesthesia Post Note  Patient: Robert [name]  Procedure(s) Performed: Procedure(s) (LRB): RIIGHT UPPER ARM FISTULA SUPERFICIALIZATION (Right)  Patient location during evaluation: PACU Anesthesia Type: General Level of consciousness: awake Pain management: pain level controlled Vital Signs Assessment: post-procedure vital signs reviewed and stable Respiratory status: spontaneous breathing Cardiovascular status: stable Anesthetic complications: no    Last Vitals:  Filed Vitals:   02/07/16 1200 02/07/16 1216  BP:  95/50  Pulse: 74 78  Temp: 36.7 C   Resp: 14     Last Pain:  Filed Vitals:   02/07/16 1218  PainSc: 0-No pain                 EDWARDS,Keisy Strickler

## 2016-02-07 NOTE — Anesthesia Postprocedure Evaluation (Signed)
Anesthesia Post Note  Patient: Robert [name]  Procedure(s) Performed: Procedure(s) (LRB): RIIGHT UPPER ARM FISTULA SUPERFICIALIZATION (Right)  Patient location during evaluation: PACU Anesthesia Type: General Level of consciousness: awake Pain management: pain level controlled Respiratory status: spontaneous breathing Cardiovascular status: stable Anesthetic complications: no    Last Vitals:  Filed Vitals:   02/07/16 1004 02/07/16 1015  BP: 108/60 104/58  Pulse: 79 82  Temp: 36.6 C   Resp: 18 22    Last Pain:  Filed Vitals:   02/07/16 1023  PainSc: 5                  EDWARDS,Claretta Kendra

## 2016-02-07 NOTE — Interval H&P Note (Signed)
History and Physical Interval Note:  02/07/2016 7:26 AM  Robert [name]  has presented today for surgery, with the diagnosis of End Stage Renal Disease N18.6  The various methods of treatment have been discussed with the patient and family. After consideration of risks, benefits and other options for treatment, the patient has consented to  Procedure(s): FISTULA SUPERFICIALIZATION (Right) as a surgical intervention .  The patient's history has been reviewed, patient examined, no change in status, stable for surgery.  I have reviewed the patient's chart and labs.  Questions were answered to the patient's satisfaction.     [name]

## 2016-02-07 NOTE — Anesthesia Procedure Notes (Addendum)
Procedure Name: Intubation Date/Time: 02/07/2016 8:02 AM Performed by: Merdis Delay Pre-anesthesia Checklist: Patient identified, Timeout performed, Emergency Drugs available, Suction available and Patient being monitored Patient Re-evaluated:Patient Re-evaluated prior to inductionOxygen Delivery Method: Circle system utilized Preoxygenation: Pre-oxygenation with 100% oxygen Intubation Type: IV induction Ventilation: Mask ventilation without difficulty Tube type: Oral Tube size: 7.5 mm Number of attempts: 1 Airway Equipment and Method: Video-laryngoscopy Placement Confirmation: ETT inserted through vocal cords under direct vision,  breath sounds checked- equal and bilateral,  positive ETCO2 and CO2 detector Secured at: 22 cm Tube secured with: Tape Dental Injury: Teeth and Oropharynx as per pre-operative assessment  Difficulty Due To: Difficulty was anticipated Comments: RSI

## 2016-02-07 NOTE — Progress Notes (Signed)
Diabetic coordinator @ bedside to speak with patient. Pt is on several different diabetic meds, including long-acting  trebisa. Combination of meds & NPO has lowered his glucose levels.  Pt is more than competent & verbalizes understanding that he needs to monitor CBG's closely today. Will reinforce information with pt's wife prior to discharge.

## 2016-02-07 NOTE — Telephone Encounter (Signed)
[facility] call center. Caller stated he is having vascular surgery on his arm tomorrow, and wanted to discuss his medication with Dr Dwyane Dee, please advise

## 2016-02-07 NOTE — H&P (View-Only) (Signed)
CC:  poorly maturing AV fistula HPI:  This is a 55 y.o. male who is s/p Right BC fistula creation 09/15/2015 by Dr. Trula Slade. It has been used for HD since early March.  He has had progressive numbness and pain in his hand when on HD.  He was taking ibuprofen but has now been started on tramadol by Dr. Lorrene Reid. He states this helps considerably. He states the pain in his right hand is tolerable at this point. He states they're still having difficulty sticking his fistula. He has a catheter on the right side. Previous ultrasound showed the fistula 6-8 mm in diameter.  No Known Allergies    Current Outpatient Prescriptions   Medication  Sig  Dispense  Refill   .  aspirin 81 MG tablet  Take 81 mg by mouth every morning.        Marland Kitchen  CALCITRIOL PO  Take 75 mg by mouth 3 (three) times a week. Monday, Wednesday and Friday.       .  calcium acetate (PHOSLO) 667 MG capsule  Take 667 mg by mouth 3 (three) times daily with meals.       .  cetirizine (ZYRTEC) 10 MG tablet  Take 10 mg by mouth every morning.        .  cinacalcet (SENSIPAR) 30 MG tablet  Take 60 mg by mouth daily.        Marland Kitchen  docusate sodium (COLACE) 100 MG capsule  Take 100 mg by mouth daily.        .  fluocinonide cream (LIDEX) [id number] %  Apply 1 application topically daily as needed (Skin inflammation (hand, thigh, back)).        [name] 1000 MG chewable tablet  Chew 1,000-2,000 mg by mouth 4 (four) times daily. 2000 mg with every meal and 1000 mg with snack       .  glucose blood (ONETOUCH VERIO) test strip  Use as instructed to check blood sugar 6 a day dx code E11.29  550 each  3   .  Insulin Pen Needle 31G X 5 MM MISC  Use 4 per day to inject insulin.  400 each  1   .  insulin regular human CONCENTRATED (HUMULIN) 500 UNIT/ML injection  Inject 6 units in the morning and 8 units in the pm (Patient taking differently: Inject 12-18 Units into the skin 2 (two) times daily. )  2 pen  3   .  Insulin Syringe-Needle U-100 (INSULIN SYRINGE  1CC/31GX5/16") 31G X 5/16" 1 ML MISC  Use to inject insulin  100 each  3   .  LANTUS SOLOSTAR 100 UNIT/ML Solostar Pen  INJECT 30 UNITS INTO THE SKIN EVERY MORNING.  45 mL  1   .  levothyroxine (SYNTHROID, LEVOTHROID) 100 MCG tablet  TAKE 1 TABLET BY MOUTH EVERY DAY  90 tablet  1   .  polyethylene glycol (MIRALAX / GLYCOLAX) packet  Take 17 g by mouth daily.       .  rosuvastatin (CRESTOR) 10 MG tablet  Take 1 tablet (10 mg total) by mouth daily.  90 tablet  3   .  senna (SENOKOT) 8.6 MG tablet  Take 1 tablet by mouth daily.       Marland Kitchen  LANTUS SOLOSTAR 100 UNIT/ML Solostar Pen  INJECT 30 UNITS INTO THE SKIN EVERY MORNING. (Patient not taking: Reported on 01/05/2016)  45 mL  1      No  current facility-administered medications for this visit.      ROS:  See HPI  Physical Exam:    Filed Vitals:   01/19/16 1534  BP: 134/78  Pulse: 110  Height: 6' 0.25" (1.835 m)  Weight: 308 lb 14.4 oz (140.116 kg)  SpO2: 96%    Incision:  Well healed Extremities:  Palpable radial pulse and palpable fistula thrill distal half, deeper thrill to palpation on the proximal 1/2.    Neuro: decreased sensation to the ulnar nerve distribution on the right compared to his left.   Assessment/Plan:  At this point the patient has decided that the pain symptoms in his right hand are tolerable. He would like to proceed with Superficialization of the fistula. This is scheduled for 02/07/2016. [name], MD [facility] Office: [phone] Pager: [phone]

## 2016-02-07 NOTE — Op Note (Signed)
Procedure: Right Brachial Cephalic AV Fistula side branch ligation and superficialization PreOp: Poorly functioning AVF PostOp: same Anesthesia: General Asst: Gerri Lins PA-C  Findings: 3 large side branches ligated, diameter of main fistula 10 mm  Operative details: After obtaining informed consent, the patient was taken to the operating room. The patient was placed in supine position on the operating table. After commencing general anesthesia, the patient's entire right upper extremity was prepped and draped in the correct in the usual sterile fashion.  A longitudinal incision was made over the proximal aspect of the fistula just above the antecubital crease. The incision was carried onto the subcutaneous tissues down to level of the pre-existing AV fistula. The fistula was patent and did have a thrill within it. The fistula was dissected free circumferentially in each incision and all side branches identified and ligated and divided between silk ties.  The main fistula was 10 mm diameter.  After all side branches were ligated the subcutaneous tissues under each skin bridge were debrided of subcutaneous fat to superficialize the fistula.  Hemostasis was obtained.  The wound was thoroughly irrigated with normal saline solution.  The skin of each incision was closed with a 4  0 Vicryl subcuticular stitch. The patient tolerated the procedure well and there were no complications. Instrument sponge and needle counts were correct at the end of the case. The patient was taken to the recovery room in stable condition.  [name], MD [facility] Office: [phone] Pager: [phone]

## 2016-02-07 NOTE — Progress Notes (Signed)
CBG 38 on arrival to PACU. Pt asymptomatic. Anesthesia notified. Will give D50 as ordered & continue to monitor

## 2016-02-07 NOTE — Telephone Encounter (Signed)
Noted, patient is aware, I asked him to call back on Friday to let us know how his sugar is doing? He did ask if he was suppose to take his Humulin R U-500 as needed?

## 2016-02-07 NOTE — Anesthesia Preprocedure Evaluation (Addendum)
Anesthesia Evaluation  Patient identified by MRN, date of birth, ID band Patient awake    Reviewed: Allergy & Precautions, NPO status , Patient's Chart, lab work & pertinent test results  History of Anesthesia Complications (+) DIFFICULT AIRWAY  Airway Mallampati: II  TM Distance: >3 FB Neck ROM: Full    Dental  (+) Teeth Intact, Dental Advisory Given   Pulmonary shortness of breath, sleep apnea , former smoker,    breath sounds clear to auscultation       Cardiovascular hypertension, + dysrhythmias  Rhythm:Regular Rate:Normal     Neuro/Psych  Headaches,    GI/Hepatic Neg liver ROS, GERD  ,  Endo/Other  diabetes  Renal/GU Renal disease     Musculoskeletal   Abdominal   Peds  Hematology   Anesthesia Other Findings   Reproductive/Obstetrics                           Anesthesia Physical Anesthesia Plan  ASA: III  Anesthesia Plan: General   Post-op Pain Management:    Induction: Intravenous  Airway Management Planned: Oral ETT  Additional Equipment:   Intra-op Plan:   Post-operative Plan: Possible Post-op intubation/ventilation  Informed Consent: I have reviewed the patients History and Physical, chart, labs and discussed the procedure including the risks, benefits and alternatives for the proposed anesthesia with the patient or authorized representative who has indicated his/her understanding and acceptance.   Dental advisory given  Plan Discussed with: CRNA and Anesthesiologist  Anesthesia Plan Comments:         Anesthesia Quick Evaluation

## 2016-02-07 NOTE — Telephone Encounter (Signed)
He needs to continue U-500 as before, Robert [name] is only replacing Lantus

## 2016-02-07 NOTE — Progress Notes (Signed)
CBG=68. Pt asymptomatic. Dr.Edwards and Dr.Fields notified. Both MDs okay with pt going home at this time. Dr.Fields request pt eats and cont to monitor blood sugar at home. Pt agrees and states he will follow up with his endocrinologist today.

## 2016-02-07 NOTE — Telephone Encounter (Signed)
Please call about meds

## 2016-02-07 NOTE — Telephone Encounter (Signed)
I spoke with Robert [name] just now and he said he's been taking both [name] and Lantus? He said before his procedure this morning for a fistula his sugar was 31, they gave him dextrose and it shot up to 177, post op it was down to 38.  Please advise on what insulin he's suppose to take and how much?

## 2016-02-07 NOTE — Progress Notes (Signed)
Repeat cbg =93 / tolerating po flds/gingerale..I spoke with the patient about this. With Faythe Dingwall /Diabetic Coordinator re: this am's cbg readings/Rx and request of Dr Oletta Lamas she interview pt in PACU.... Will review his records and come see.

## 2016-02-08 ENCOUNTER — Encounter (HOSPITAL_COMMUNITY): Payer: Self-pay | Admitting: Vascular Surgery

## 2016-02-08 NOTE — Telephone Encounter (Signed)
Noted, detailed message left on his answering machine.

## 2016-02-13 ENCOUNTER — Telehealth: Payer: Self-pay | Admitting: Surgery

## 2016-02-13 NOTE — Telephone Encounter (Signed)
Pt's second time calling; please call he is heading to dialysis; he is having signs of infection in his  right upper arm; his very concern; and out of pain pills; can't use any more of his tramadol bc his insurance will not pay anymore.Marland KitchenMarland Kitchen

## 2016-02-13 NOTE — Telephone Encounter (Signed)
Returned call to pt.  Reported he has increased redness and tenderness in the right upper arm incisional area;  Reported the redness extends out to approx. 1.5" surrounding the incision. Denied any incisional drainage.  Denied any fever/ chills.  Also, reported that the steal symptoms are worsening; c/o numbness/ tingling of the right 5th finger, ring finger, and middle finger.  Also c/o intermittent "shooting pain" at night, from the middle of his right palm to the fingers.  Questioned what he can do to manage the pain?  Offered an appt. tomorrow, 5/9, but the pt. declined, stating he has meetings scheduled from 10:00-3:00 PM.  Appt. Given for 1:15 PM 02/15/16.  Pt. Agreed with plan.  Advised to call office if symptoms worsen prior to appt. 5/10.  Verb. Understanding.

## 2016-02-14 ENCOUNTER — Encounter: Payer: Self-pay | Admitting: Family

## 2016-02-14 ENCOUNTER — Ambulatory Visit: Payer: Medicare Other | Admitting: *Deleted

## 2016-02-14 VITALS — Ht 72.0 in | Wt 309.0 lb

## 2016-02-14 DIAGNOSIS — E1149 Type 2 diabetes mellitus with other diabetic neurological complication: Secondary | ICD-10-CM

## 2016-02-14 NOTE — Progress Notes (Signed)
Patient ID: Robert [name], male   DOB: 05-Jul-1961, 55 y.o.   MRN: [id number] Patient presents to be scanned and measured for diabetic shoes and inserts.

## 2016-02-14 NOTE — Telephone Encounter (Signed)
sched appt for 02/15/16 at 1:15.

## 2016-02-15 ENCOUNTER — Encounter: Payer: Self-pay | Admitting: Family

## 2016-02-15 ENCOUNTER — Ambulatory Visit (INDEPENDENT_AMBULATORY_CARE_PROVIDER_SITE_OTHER): Payer: Medicare Other | Admitting: Family

## 2016-02-15 VITALS — BP 140/77 | HR 95 | Temp 97.9°F | Resp 20 | Ht 72.75 in | Wt 309.0 lb

## 2016-02-15 DIAGNOSIS — N186 End stage renal disease: Secondary | ICD-10-CM

## 2016-02-15 DIAGNOSIS — Z992 Dependence on renal dialysis: Secondary | ICD-10-CM

## 2016-02-15 DIAGNOSIS — T82898D Other specified complication of vascular prosthetic devices, implants and grafts, subsequent encounter: Secondary | ICD-10-CM

## 2016-02-15 NOTE — Progress Notes (Signed)
    Postoperative Access Visit   History of Present Illness  Robert [name] is a 55 y.o. year old male patient of Dr. Oneida Alar who is s/p Right Brachial Cephalic AV Fistula side branch ligation and superficialization for poorly functioning AVF, 3 large side branches ligated, diameter of main fistula 10 mm, on 02/07/16. He dialyzes M-W-F via a temporary catheter in his upper chest. Pt returns today with c/o steal sx's in right forearm that wake him up at night, worse during HD.  The patient is able to complete their activities of daily living.    For VQI Use Only  PRE-ADM LIVING: Home  AMB STATUS: Ambulatory  Physical Examination Filed Vitals:   02/15/16 1321  BP: 140/77  Pulse: 95  Temp: 97.9 F (36.6 C)  TempSrc: Oral  Resp: 20  Height: 6' 0.75" (1.848 m)  Weight: 309 lb (140.161 kg)  SpO2: 94%   Body mass index is 41.04 kg/(m^2).   Right UE: Incision is healing well, skin feels normal and warm, hand grip is 5/5, sensation in digits is intact, palpable thrill, bruit can be auscultated   Medical Decision Making  Robert [name] is a 55 y.o. year old male who presents s/p Right Brachial Cephalic AV Fistula side branch ligation and superficialization for poorly functioning AVF, 3 large side branches ligated, diameter of main fistula 10 mm, on 02/07/16. Dr. Oneida Alar spoke with and examined pt. Pt has a 1-2+ palpable right radial pulse. He has a palpable thrill and audible bruit in his right upper arm. Follow up in 1 week with Dr. Oneida Alar for evaluation of right upper arm AVF, this will be a phone conversation by Dr. Oneida Alar with pt next week; pt will call the office next week and be connected with Dr. Oneida Alar. Pt states he has enough Tramadol to last until next week.   Shante Maysonet, Sharmon Leyden, RN, MSN, FNP-C [facility] Office: [phone]  02/15/2016, 1:28 PM  Clinic MD: Oneida Alar

## 2016-02-17 ENCOUNTER — Telehealth: Payer: Self-pay | Admitting: Cardiovascular Disease

## 2016-02-17 NOTE — Telephone Encounter (Signed)
Returned call and spoke to patient. He states [facility] has not received sleep study documents that are needed. 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 Patient wants a call back from [place]  - aware she is out of office - OK to f/u next week.

## 2016-02-17 NOTE — Telephone Encounter (Signed)
Please call,he says he needs to talk to somebody concerning an order. He was not clear on what he wanted,he said he wanted to speak to a nurse.

## 2016-02-22 NOTE — Telephone Encounter (Signed)
Returned a call to patient informing him that when he was in the office he told me his provider was [facility]. I sent a order for a download to them on 02/01/16. Patient admits that he gave me the wrong company. Informed him that I will send over a order to get a download on his machine. Patient apologized. Call ended.

## 2016-02-27 ENCOUNTER — Telehealth: Payer: Self-pay | Admitting: Cardiovascular Disease

## 2016-03-13 ENCOUNTER — Encounter: Payer: Self-pay | Admitting: Podiatry

## 2016-03-13 ENCOUNTER — Ambulatory Visit (INDEPENDENT_AMBULATORY_CARE_PROVIDER_SITE_OTHER): Payer: BLUE CROSS/BLUE SHIELD | Admitting: Podiatry

## 2016-03-13 DIAGNOSIS — M2011 Hallux valgus (acquired), right foot: Secondary | ICD-10-CM

## 2016-03-13 DIAGNOSIS — E1149 Type 2 diabetes mellitus with other diabetic neurological complication: Secondary | ICD-10-CM

## 2016-03-13 DIAGNOSIS — M205X9 Other deformities of toe(s) (acquired), unspecified foot: Secondary | ICD-10-CM

## 2016-03-13 DIAGNOSIS — M2012 Hallux valgus (acquired), left foot: Secondary | ICD-10-CM

## 2016-03-13 DIAGNOSIS — B351 Tinea unguium: Secondary | ICD-10-CM

## 2016-03-13 DIAGNOSIS — M2020 Hallux rigidus, unspecified foot: Secondary | ICD-10-CM

## 2016-03-13 DIAGNOSIS — M79676 Pain in unspecified toe(s): Secondary | ICD-10-CM

## 2016-03-13 NOTE — Progress Notes (Signed)
Patient ID: Robert [name], male   DOB: [birth year], 55 y.o.   MRN: [id number] Complaint:  Visit Type: Patient returns to my office for continued preventative foot care services. Complaint: Patient states" my nails have grown long and thick and become painful to walk and wear shoes" Patient has been diagnosed with DM and is taking dialysis and neuropathy.  . The patient presents for preventative foot care services. No changes to ROS  Podiatric Exam: Vascular: dorsalis pedis and posterior tibial pulses are palpable bilateral. Capillary return is immediate. Temperature gradient is WNL. Skin turgor WNL  Sensorium: Diminished  Semmes Weinstein monofilament test. Normal tactile sensation bilaterally. Nail Exam: Pt has thick disfigured discolored nails with subungual debris noted bilateral entire nail hallux through fifth toenails Ulcer Exam: There is no evidence of ulcer or pre-ulcerative changes or infection. Orthopedic Exam: Muscle tone and strength are WNL. No limitations in general ROM. No crepitus or effusions noted. Foot type and digits show no abnormalities. Bony prominences are unremarkable. HAV  B/L Skin: No Porokeratosis. No infection or ulcers  Diagnosis:  Onychomycosis, , Pain in right toe, pain in left toes, Diabetic neuropathy  Hallux limitus 1st MPJ  B/L  Treatment & Plan Procedures and Treatment: Consent by patient was obtained for treatment procedures. The patient understood the discussion of treatment and procedures well. All questions were answered thoroughly reviewed. Debridement of mycotic and hypertrophic toenails, 1 through 5 bilateral and clearing of subungual debris. No ulceration, no infection noted. Dispense diabetic shoes. Return Visit-Office Procedure: Patient instructed to return to the office for a follow up visit 3 months for continued evaluation and treatment.    Gardiner Barefoot DPM

## 2016-03-13 NOTE — Patient Instructions (Signed)

## 2016-03-13 NOTE — Progress Notes (Signed)
Patient presents for diabetic shoe pick up, shoes are tried on for good fit.  Patient received 1 Pair Apex LT200M [place] strap loafer black in men's size 12.5 wide and 3 pairs custom molded diabetic inserts.  Verbal and written break in and wear instructions given.  Patient will follow up for scheduled routine care.

## 2016-03-15 ENCOUNTER — Other Ambulatory Visit (INDEPENDENT_AMBULATORY_CARE_PROVIDER_SITE_OTHER): Payer: BLUE CROSS/BLUE SHIELD

## 2016-03-15 DIAGNOSIS — E1165 Type 2 diabetes mellitus with hyperglycemia: Secondary | ICD-10-CM

## 2016-03-15 DIAGNOSIS — E785 Hyperlipidemia, unspecified: Secondary | ICD-10-CM | POA: Diagnosis not present

## 2016-03-15 DIAGNOSIS — Z794 Long term (current) use of insulin: Secondary | ICD-10-CM

## 2016-03-15 LAB — LIPID PANEL
CHOLESTEROL: 161 mg/dL (ref 0–200)
HDL: 37.7 mg/dL — AB (ref >39.00)
LDL Cholesterol: 96 mg/dL (ref 0–99)
NonHDL: 122.98
TRIGLYCERIDES: 135 mg/dL (ref 0.0–149.0)
Total CHOL/HDL Ratio: 4
VLDL: 27 mg/dL (ref 0.0–40.0)

## 2016-03-15 LAB — ALT: ALT: 16 U/L (ref 0–53)

## 2016-03-16 LAB — FRUCTOSAMINE: Fructosamine: 359 umol/L — ABNORMAL HIGH (ref 0–285)

## 2016-03-22 ENCOUNTER — Encounter: Payer: Self-pay | Admitting: Endocrinology

## 2016-03-22 ENCOUNTER — Ambulatory Visit (INDEPENDENT_AMBULATORY_CARE_PROVIDER_SITE_OTHER): Payer: BLUE CROSS/BLUE SHIELD | Admitting: Endocrinology

## 2016-03-22 VITALS — BP 134/76 | HR 92 | Ht 72.75 in | Wt 304.0 lb

## 2016-03-22 DIAGNOSIS — E039 Hypothyroidism, unspecified: Secondary | ICD-10-CM

## 2016-03-22 DIAGNOSIS — E1165 Type 2 diabetes mellitus with hyperglycemia: Secondary | ICD-10-CM

## 2016-03-22 DIAGNOSIS — Z794 Long term (current) use of insulin: Secondary | ICD-10-CM

## 2016-03-22 DIAGNOSIS — E291 Testicular hypofunction: Secondary | ICD-10-CM

## 2016-03-22 LAB — GLUCOSE, POCT (MANUAL RESULT ENTRY): POC Glucose: 151 mg/dl — AB (ref 70–99)

## 2016-03-22 NOTE — Progress Notes (Signed)
Patient ID: Robert [name], male   DOB: [birth year], 55 y.o.   MRN: [id number]   Reason for Appointment: Type II Diabetes follow-up   History of Present Illness   Diagnosis date:  2012  Previous history: His blood sugar has been difficult to control since onset. Initially was taking oral hypoglycemic drugs like glyburide but could not take metformin because of renal dysfunction. Since Victoza did not help his control he was started on Lantus and then U 500 insulin also His A1c has previously ranged from 7.8-9.8, the lowest level in 03/2013 He was on Victoza but because of his markedly decreased appetite and weight loss this was stopped in 9/15  Recent history:   Insulin regimen: Tresiba 30 units In a.m.  U-500 insulin with syringe: 10 units at lunch, 12-20 units acs  His A1c is usually lower than expected for his blood sugars and usually below 6 However fructosamine is looking better on this visit, improved at 359  Current blood sugar patterns, management and problems identified:  Fasting blood sugars have been reportedly fairly good without hypoglycemia  He has not had any significant changes in his blood sugar patterns since switching from Lantus to [name]  However he has not tested his blood sugar for about 3 weeks as he lost his meter  He is not always taking his U-500 insulin 30 minutes before eating recently  He is trying to adjust mealtime doses based on his carbohydrate intake on meal size especially at suppertime  Because of taking Sensipar and this causing nausea his appetite has been variable  His trying to walk more regularly and weight is slightly better  Proper timing of medications in relation to meals: Yes.          Monitors blood glucose: Once a day or less.    Glucometer: One Probation officer.          Blood Glucose readings by recall:  Mean values apply above for all meters except median for One Touch  PRE-MEAL Fasting Lunch Dinner Bedtime Overall   Glucose range: 100 120 140 140   Mean/median:           Meals: 2-3 meals per day. 8 am and Noon and Dinner 8-9 PM .  Is on the low phosphorus diet and has fewer carbohydrates, not eating cereals        Physical activity: exercise: more walking, nearly everyday         Weight control:  Wt Readings from Last 3 Encounters:  03/22/16 304 lb (137.893 kg)  02/15/16 309 lb (140.161 kg)  02/14/16 309 lb (A999333 kg)        Complications:   nephropathy, neuropathy   Diabetes labs:   Lab Results  Component Value Date   HGBA1C 5.7 12/15/2015   HGBA1C 5.8 09/16/2015   HGBA1C 5.4 06/23/2015   Lab Results  Component Value Date   MICROALBUR 224.5 Repeated and verified X2.* 01/04/2014   LDLCALC 96 03/15/2016   CREATININE 10.76* 05/13/2015       Medication List       This list is accurate as of: 03/22/16  9:02 AM.  Always use your most recent med list.               aspirin 325 MG tablet  Take 325 mg by mouth daily.     BELSOMRA 20 MG Tabs  Generic drug:  Suvorexant     calcitRIOL 0.25 MCG capsule  Commonly known as:  ROCALTROL  Take 0.75 mcg by mouth 3 (three) times a week. DURING DIALYSIS     calcium acetate 667 MG capsule  Commonly known as:  PHOSLO  Take 667 mg by mouth 3 (three) times daily with meals.     cetirizine 10 MG tablet  Commonly known as:  ZYRTEC  Take 10 mg by mouth every morning.     docusate sodium 100 MG capsule  Commonly known as:  COLACE  Take 100 mg by mouth daily.     DULoxetine 30 MG capsule  Commonly known as:  CYMBALTA     ethyl chloride spray  USE PRE DIALYSIS AS NEEDED     fluocinonide cream 0.05 %  Commonly known as:  LIDEX  Apply 1 application topically daily as needed (Skin inflammation (hand, thigh, back)).     FOSRENOL 1000 MG chewable tablet  Generic drug:  lanthanum  Chew 1,000-2,000 mg by mouth 4 (four) times daily. 2000 mg with every meal and 1000 mg with snack     glucose blood test strip  Commonly known as:   ONETOUCH VERIO  Use as instructed to check blood sugar 6 a day dx code E11.29     HUMULIN R 500 UNIT/ML injection  Generic drug:  insulin regular human CONCENTRATED  INJECT 12 UNITS IN THE AM AND 18 UNITS AT SUPPER     Insulin Pen Needle 31G X 5 MM Misc  Use 4 per day to inject insulin.     INSULIN SYRINGE 1CC/31GX5/16" 31G X 5/16" 1 ML Misc  Use to inject insulin     levothyroxine 100 MCG tablet  Commonly known as:  SYNTHROID, LEVOTHROID  Take 1 tablet (100 mcg total) by mouth daily.     lidocaine-prilocaine cream  Commonly known as:  EMLA  APPLY SMALL AMOUNT TO DIALYSIS SITE 30 -60 MIUNTES PRIOR TO DIALYSIS 3X A WEEK     nebivolol 5 MG tablet  Commonly known as:  BYSTOLIC  Take 1 tablet daily on non dialysis days     polyethylene glycol packet  Commonly known as:  MIRALAX / GLYCOLAX  Take 17 g by mouth daily.     rosuvastatin 10 MG tablet  Commonly known as:  CRESTOR  Take 1 tablet (10 mg total) by mouth daily.     senna 8.6 MG tablet  Commonly known as:  SENOKOT  Take 1 tablet by mouth daily.     SENSIPAR 60 MG tablet  Generic drug:  cinacalcet  Take 60 mg by mouth daily.     traMADol 50 MG tablet  Commonly known as:  ULTRAM  Take 1 tablet (50 mg total) by mouth every 6 (six) hours as needed.     TRESIBA FLEXTOUCH 100 UNIT/ML Sopn  Generic drug:  Insulin Degludec  Inject 30 Units into the skin daily.        Allergies: No Known Allergies  Past Medical History  Diagnosis Date  . Thyroid disease   . Hyperlipidemia   . PAT (paroxysmal atrial tachycardia) ([place])   . PSVT (paroxysmal supraventricular tachycardia) ([place])   . Blood transfusion without reported diagnosis 1974    kidney removed - L  . CPAP (continuous positive airway pressure) dependence   . Dysrhythmia   . Shortness of breath   . Hypothyroidism   . GERD (gastroesophageal reflux disease)     n/v a lot  . Headache(784.0)     slight dull h/a due kidney failure  . Anemia     low  hgb at  present  . Umbilical hernia     will repair with thi surgery  . Neuropathy (HCC)     right hand and both feet  . Constipation   . Macular degeneration   . Eczema   . Sleep apnea 06/18/11    split-night sleep study- [facility]., BiPAP- no oxygen  . Cancer ([place])     skin ca right shoulder, plastic dsyplasia, pre-Ca polpys removed on Colonoscopy- 07/2014  . Hypertension     SVT h/o , followed by Dr. Claiborne Billings  . SBO (small bowel obstruction) ([place])   . Difficult intubation     unsure of actual problem but it was during the January 28, 2015 procedure.  . Diabetes mellitus without complication (HCC)     Type 2  . ESRD (end stage renal disease) ([place])     hEMO mwf  . Arthritis     FEET    Past Surgical History  Procedure Laterality Date  . Nephrectomy Left 1974  . Renal biopsy Right 2012  . Skin cancer excision      right shoulder  . Capd insertion N/A 05/18/2014    Procedure: LAPAROSCOPIC INSERTION CONTINUOUS AMBULATORY PERITONEAL DIALYSIS  (CAPD) CATHETER;  Surgeon: Ralene Ok, MD;  Location: [place];  Service: General;  Laterality: N/A;  . Umbilical hernia repair N/A 05/18/2014    Procedure: HERNIA REPAIR UMBILICAL ADULT;  Surgeon: Ralene Ok, MD;  Location: [place];  Service: General;  Laterality: N/A;  . Laparoscopic repositioning capd catheter N/A 06/16/2014    Procedure: LAPAROSCOPIC REPOSITIONING CAPD CATHETER;  Surgeon: Ralene Ok, MD;  Location: [place];  Service: General;  Laterality: N/A;  . Ij catheter insertion    . Colonoscopy    . Insertion of dialysis catheter Right 02/25/2015    Procedure: INSERTION OF RIGHT INTERNAL JUGULAR DIALYSIS CATHETER;  Surgeon: Serafina Mitchell, MD;  Location: [place];  Service: Vascular;  Laterality: Right;  . Av fistula placement Right 02/25/2015    Procedure: RIGHT ARTERIOVENOUS (AV) FISTULA CREATION;  Surgeon: Serafina Mitchell, MD;  Location: [place] [facility];  Service: Vascular;  Laterality: Right;  . Umbilical hernia repair N/A  04/28/2015    Procedure: UMBILICAL HERNIA REPAIR WITH MESH;  Surgeon: Coralie Keens, MD;  Location: [place];  Service: General;  Laterality: N/A;  . Minor removal of peritoneal dialysis catheter N/A 04/28/2015    Procedure:  REMOVAL OF PERITONEAL DIALYSIS CATHETER;  Surgeon: Coralie Keens, MD;  Location: [place];  Service: General;  Laterality: N/A;  . Laparoscopy N/A 05/04/2015    Procedure: LAPAROSCOPY DIAGNOSTIC LYSIS OF ADHESIONS;  Surgeon: Coralie Keens, MD;  Location: [place];  Service: General;  Laterality: N/A;  . Revison of arteriovenous fistula Right 05/26/2015    Procedure: SUPERFICIALIZATION OF ARTERIOVENOUS FISTULA WITH SIDE BRANCH LIGATIONS;  Surgeon: Serafina Mitchell, MD;  Location: [place];  Service: Vascular;  Laterality: Right;  . Av fistula placement Right 09/15/2015    Procedure: ARTERIOVENOUS (AV) FISTULA CREATION- RIGHT ARM;  Surgeon: Serafina Mitchell, MD;  Location[facility];  Service: Vascular;  Laterality: Right;  . Fistula superficialization Right 02/07/2016    Procedure: RIIGHT UPPER ARM FISTULA SUPERFICIALIZATION;  Surgeon: Elam Dutch, MD;  Location[facility];  Service: Vascular;  Laterality: Right;    Family History  Problem Relation Age of Onset  . Colon polyps Father   . Colon cancer Neg Hx   . Stomach cancer Neg Hx     Social History:  reports that he quit smoking about 3 years ago. His smoking use included Cigars. He has never used smokeless tobacco. He reports that he drinks about 0.6 oz of alcohol per week. He reports that he does not use illicit drugs.  Review of Systems:  Hypertension:  BP is variably controlled and he tends to have low readings during dialysis Periodically  His blood pressure has been managed by his nephrologist   Lipids: He has much better levels previously, had been off his Crestor on his last visit in this was started at 10 mg His LDL is below 100 with 10 mg Crestor Has no evidence of vascular disease    Lab Results  Component Value  Date   CHOL 161 03/15/2016   CHOL 219* 12/15/2015   CHOL 87 09/20/2014   Lab Results  Component Value Date   HDL 37.70* 03/15/2016   HDL 44.40 12/15/2015   HDL 36.20* 09/20/2014   Lab Results  Component Value Date   LDLCALC 96 03/15/2016   LDLCALC 147* 12/15/2015   LDLCALC 34 09/20/2014   Lab Results  Component Value Date   TRIG 135.0 03/15/2016   TRIG 138.0 12/15/2015   TRIG 83.0 09/20/2014   Lab Results  Component Value Date   CHOLHDL 4 03/15/2016   CHOLHDL 5 12/15/2015   CHOLHDL 2 09/20/2014   No results found for: LDLDIRECT   Thyroid: He has had long-standing hypothyroidism   TSH is normal .  Has been taking his 100 g levothyroxine before breakfast consistently   Lab Results  Component Value Date   TSH 2.54 12/15/2015    He is on waiting list for  renal transplant and is going to [facility] for this    HYPOGONADISM: 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 metabolic syndrome and had subjectively improved with supplementation using Fortesta He has been off testosterone for several months and free testosterone was normal  Lab Results  Component Value Date   TESTOSTERONE 308* 09/16/2015    Diabetic neuropathy: Has mild sensory loss on his last exam from podiatrist   Examination:   BP 134/76 mmHg  Pulse 92  Ht 6' 0.75" (1.848 m)  Wt 304 lb (137.893 kg)  BMI 40.38 kg/m2  SpO2 97%  Body mass index is 40.38 kg/(m^2).    ASSESSMENT/ PLAN:    Diabetes type 2 with obesity:  See history of present illness for detailed discussion of his current management, problems identified and sugar patterns lately His blood sugars have been Difficult to assess since he did not bring his meter misplaced He reports fairly good blood sugars last month but from previous history appears that he usually has significant fluctuation in his glucose levels  Again his fructosamine is more reliable than A1c for assessing his blood sugar control and this is  improved compared to the previous levels He has not seen any change in his blood sugars with [name] compared to Lantus but may have had less variability with this Highest blood sugars appear to be before supper by recall and he can continue adjusting his dose of U-500 based on meal size, probably needs at least 2 more units at lunch Also reminded him to take insulin 30 minutes before eating and discussed time course of action of U-500 insulin which he continues to take with a syringe   HYPOTHYROIDISM: We will need to repeat levels on the next visit  HYPERLIPIDEMIA: Because of his renal complications, significant diabetes and low HDL will need to his  LDL below 70 Inadequately controlled with 10 mg and he will need to increase Crestor to 20 mg  Follow-up in 3 months again with repeat lipids  Patient Instructions  Upto 12 on the lunch dose  2 of Crestor daily  Check blood sugars on waking up  3-4 times a week Also check blood sugars about 2 hours after a meal and do this after different meals by rotation  Recommended blood sugar levels on waking up is 90-130 and about 2 hours after meal is 130-160  Please bring your blood sugar monitor to each visit, thank you    Counseling time on subjects discussed above is over 50% of today's 25 minute visit   Ari Engelbrecht 03/22/2016, 9:02 AM

## 2016-03-22 NOTE — Addendum Note (Signed)
Addended by: Verlin Grills T on: 03/22/2016 09:25 AM   Modules accepted: Orders

## 2016-03-22 NOTE — Patient Instructions (Addendum)
Upto 12 on the lunch dose  2 of Crestor daily  Check blood sugars on waking up  3-4 times a week Also check blood sugars about 2 hours after a meal and do this after different meals by rotation  Recommended blood sugar levels on waking up is 90-130 and about 2 hours after meal is 130-160  Please bring your blood sugar monitor to each visit, thank you

## 2016-03-27 ENCOUNTER — Telehealth: Payer: Self-pay | Admitting: Endocrinology

## 2016-03-27 MED ORDER — ROSUVASTATIN CALCIUM 10 MG PO TABS: 10.0000 mg | ORAL_TABLET | Freq: Every day | ORAL | Status: DC

## 2016-03-27 NOTE — Telephone Encounter (Signed)
Rx submitted per pt's request.  

## 2016-03-27 NOTE — Telephone Encounter (Signed)
Patient need a refill of medication Crestor 20 mg 90 day supply send to  [id number] - [place], [place] - [address] AT [place] [phone] (Phone) [phone] (Fax)

## 2016-05-25 ENCOUNTER — Other Ambulatory Visit: Payer: Self-pay | Admitting: Endocrinology

## 2016-06-05 ENCOUNTER — Telehealth: Payer: Self-pay | Admitting: Gastroenterology

## 2016-06-05 NOTE — Telephone Encounter (Signed)
Spoke with patient. He is on dialysis 3 days a week. He has been noticing painless blood in his stool. Patient is also on the kidney transplant list. Appointment moved to APP for evaluation and treatment.

## 2016-06-08 ENCOUNTER — Encounter: Payer: Self-pay | Admitting: *Deleted

## 2016-06-12 ENCOUNTER — Encounter: Payer: Self-pay | Admitting: Physician Assistant

## 2016-06-12 ENCOUNTER — Ambulatory Visit (INDEPENDENT_AMBULATORY_CARE_PROVIDER_SITE_OTHER): Payer: BLUE CROSS/BLUE SHIELD | Admitting: Physician Assistant

## 2016-06-12 VITALS — BP 118/70 | HR 74 | Ht 72.0 in | Wt 298.1 lb

## 2016-06-12 DIAGNOSIS — R195 Other fecal abnormalities: Secondary | ICD-10-CM | POA: Diagnosis not present

## 2016-06-12 DIAGNOSIS — Z86010 Personal history of colonic polyps: Secondary | ICD-10-CM | POA: Diagnosis not present

## 2016-06-12 MED ORDER — NA SULFATE-K SULFATE-MG SULF 17.5-3.13-1.6 GM/177ML PO SOLN: 1.0000 | Freq: Once | ORAL | 0 refills | Status: AC

## 2016-06-12 NOTE — Patient Instructions (Signed)

## 2016-06-12 NOTE — Progress Notes (Signed)
Subjective:    Patient ID: Robert [name], male    DOB: [birth year], 55 y.o.   MRN: [id number]  HPI  Robert [name] is a pleasant 55 year old white male known to Dr. Ardis Hughs from previous colonoscopy who is referred today by Dr. Odette Fraction  for Hemoccult-positive stool and consideration of colonoscopy. Patient has history of end-stage renal disease ,is on dialysis and is in the midst of undergoing transplant evaluation through [facility]. He has a consultation on September 14. Patient had a remote unilateral nephrectomy as a teenager for a nonfunctioning kidney. He has history of hypertension, SVT, diabetes mellitus, morbid obesity, and severe sleep apnea for which he uses a BiPAP. Patient says he had a recent physical and did Hemoccults as part of that which returned positive. He himself has not seen any blood in his stools, no melena ,no changes in his bowel habits and has no complaints of abdominal pain. He denies any heartburn ,indigestion ,dysphagia   He had been taking a 325 mg aspirin for several months which he had recently stopped. He is not on any NSAIDs.  Last colonoscopy was done in October 2015 for screening- he had 3 semi-pedunculated polyps measuring between 5 and 9 mm all of which were removed. Also had mild diverticulosis,  no hemorrhoids. He was planned  for 3 year interval follow-up for path showing  tubular adenomas.  Review of Systems Pertinent positive and negative review of systems were noted in the above HPI section.  All other review of systems was otherwise negative.  Outpatient Encounter Prescriptions as of 06/12/2016  Medication Sig  . aspirin 325 MG tablet Take 325 mg by mouth daily.  . BELSOMRA 20 MG TABS   . calcitRIOL (ROCALTROL) 0.25 MCG capsule Take 0.75 mcg by mouth 3 (three) times a week. DURING DIALYSIS  . calcium acetate (PHOSLO) 667 MG capsule Take 667 mg by mouth 3 (three) times daily with meals.  . cetirizine (ZYRTEC) 10 MG tablet Take 10 mg by mouth every  morning.   . docusate sodium (COLACE) 100 MG capsule Take 100 mg by mouth daily.   . DULoxetine (CYMBALTA) 30 MG capsule   . ethyl chloride spray USE PRE DIALYSIS AS NEEDED  . fluocinonide cream (LIDEX) 3.61 % Apply 1 application topically daily as needed (Skin inflammation (hand, thigh, back)).   [name] 1000 MG chewable tablet Chew 1,000-2,000 mg by mouth 4 (four) times daily. 2000 mg with every meal and 1000 mg with snack  . glucose blood (ONETOUCH VERIO) test strip Use as instructed to check blood sugar 6 a day dx code E11.29  . HUMULIN R 500 UNIT/ML injection INJECT 12 UNITS IN THE AM AND 18 UNITS AT SUPPER  . Insulin Degludec (TRESIBA FLEXTOUCH) 100 UNIT/ML SOPN Inject 30 Units into the skin daily.   . Insulin Pen Needle 31G X 5 MM MISC Use 4 per day to inject insulin.  . Insulin Syringe-Needle U-100 (INSULIN SYRINGE 1CC/31GX5/16") 31G X 5/16" 1 ML MISC Use to inject insulin  . levothyroxine (SYNTHROID, LEVOTHROID) 100 MCG tablet TAKE 1 TABLET (100 MCG TOTAL) BY MOUTH DAILY.  Marland Kitchen lidocaine-prilocaine (EMLA) cream APPLY SMALL AMOUNT TO DIALYSIS SITE 30 -60 MIUNTES PRIOR TO DIALYSIS 3X A WEEK  . nebivolol (BYSTOLIC) 5 MG tablet Take 1 tablet daily on non dialysis days (Patient taking differently: 10 mg. Take 1 tablet daily on non dialysis days)  . polyethylene glycol (MIRALAX / GLYCOLAX) packet Take 17 g by mouth daily.  . rosuvastatin (CRESTOR) 10  MG tablet Take 1 tablet (10 mg total) by mouth daily. (Patient taking differently: Take 20 mg by mouth daily. )  . senna (SENOKOT) 8.6 MG tablet Take 1 tablet by mouth daily.  . SENSIPAR 60 MG tablet Take 60 mg by mouth daily.   . traMADol (ULTRAM) 50 MG tablet Take 1 tablet (50 mg total) by mouth every 6 (six) hours as needed.  . zolpidem (AMBIEN) 10 MG tablet Take 10 mg by mouth at bedtime as needed for sleep.  . Na Sulfate-K Sulfate-Mg Sulf 17.5-3.13-1.6 GM/180ML SOLN Take 1 kit by mouth once.   No facility-administered encounter medications  on file as of 06/12/2016.    No Known Allergies Patient Active Problem List   Diagnosis Date Noted  . Morbid obesity ([place]) 02/03/2016  . 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, with long-term current use of insulin ([place]) 07/14/2015  . Nausea & vomiting   . HTN (hypertension) 05/11/2015  . Chronic renal failure   . SBO (small bowel obstruction) ([place]) 05/04/2015  . Protein-calorie malnutrition, severe ([place]) 06/15/2014  . Uremia of renal origin 06/12/2014  . End stage renal disease on dialysis ([place]) 03/22/2014  . Fatigue 01/08/2014  . H/O unilateral nephrectomy 09/06/2013  . Diabetes mellitus type 2 in obese ([place]) 09/06/2013  . SVT (supraventricular tachycardia) ([place]) 09/06/2013  . Lower extremity edema 09/06/2013  . Severe obstructive sleep apnea 09/06/2013  . Hypothyroidism 09/06/2013  . Hyperlipidemia with target LDL less than 70 09/06/2013   Social History   Social History  . Marital status: Married    Spouse name: N/A  . Number of children: 3  . Years of education: N/A   Occupational History  . 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 Smoking status: Former Smoker    Types: Cigars    Quit date: 12/06/2012  . Smokeless tobacco: Never Used  . Alcohol use 0.6 oz/week    1 Glasses of wine per week     Comment: 1 glass of wine per week, rare use as of 05/25/15  . Drug use: No  . Sexual activity: Not on file   Other Topics Concern  . Not on file   Social History Narrative  . No narrative on file    Robert [name]'s family history includes Colon polyps in his father.      Objective:    Vitals:   06/12/16 1334  BP: 118/70  Pulse: 74    Physical Exam well-developed older white male in no acute distress, quite pleasant blood pressure 118/70 pulse 74, height 6 foot weight 298 BMI of 40. HEENT ;nontraumatic normocephalic EOMI PERRLA sclera anicteric, Cardiovascular; regular rate and rhythm with S1-S2, Pulmonary; clear  bilaterally, Abdomen obese soft nontender nondistended bowel sounds are active there is no palpable mass or hepatosplenomegaly, Rectal ;exam not done recently documented Hemoccult positive, Extremities; no clubbing cyanosis or edema skin warm and dry, Neuropsych ;mood and affect appropriate       Assessment & Plan:   #17  55 year old male with history of adenomatous colon polyps referred for Hemoccult-positive stool. Patient is undergoing renal transplant evaluation as well at [facility]. We will rule out occult lower versus upper GI source for Hemoccult-positive stool. #2 diabetes mellitus #3 obesity-BMI 40 #4 hypertension #5 severe sleep apnea -uses BiPAP-no 02  #6 hx  SVT #7 End  stage renal disease on dialysis with Monday Wednesday Friday dialysis sessions.  Plan; Patient is scheduled for colonoscopy and  EGD with Dr. Ardis Hughs. Procedures were discussed in detail with patient including risks and benefits and he is agreeable to proceed. Further plans pending results of endoscopic evaluation.   Ayshia Gramlich Genia Harold PA-C 06/12/2016   Cc: [name], [name], MD

## 2016-06-13 ENCOUNTER — Other Ambulatory Visit: Payer: Self-pay | Admitting: *Deleted

## 2016-06-13 NOTE — Progress Notes (Signed)
I agree with the above note, plan 

## 2016-06-14 ENCOUNTER — Other Ambulatory Visit (INDEPENDENT_AMBULATORY_CARE_PROVIDER_SITE_OTHER): Payer: Medicare Other

## 2016-06-14 DIAGNOSIS — Z794 Long term (current) use of insulin: Secondary | ICD-10-CM | POA: Diagnosis not present

## 2016-06-14 DIAGNOSIS — E039 Hypothyroidism, unspecified: Secondary | ICD-10-CM

## 2016-06-14 DIAGNOSIS — E291 Testicular hypofunction: Secondary | ICD-10-CM

## 2016-06-14 DIAGNOSIS — E1165 Type 2 diabetes mellitus with hyperglycemia: Secondary | ICD-10-CM

## 2016-06-14 LAB — GLUCOSE, RANDOM: Glucose, Bld: 132 mg/dL — ABNORMAL HIGH (ref 70–99)

## 2016-06-14 LAB — LIPID PANEL
CHOLESTEROL: 147 mg/dL (ref 0–200)
HDL: 36.7 mg/dL — ABNORMAL LOW (ref >39.00)
LDL CALC: 87 mg/dL (ref 0–99)
NonHDL: 110.43
Total CHOL/HDL Ratio: 4
Triglycerides: 119 mg/dL (ref 0.0–149.0)
VLDL: 23.8 mg/dL (ref 0.0–40.0)

## 2016-06-14 LAB — TSH: TSH: 2.11 u[IU]/mL (ref 0.35–4.50)

## 2016-06-14 LAB — HEMOGLOBIN A1C: HEMOGLOBIN A1C: 5.9 % (ref 4.6–6.5)

## 2016-06-16 LAB — FRUCTOSAMINE: FRUCTOSAMINE: 305 umol/L — AB (ref 0–285)

## 2016-06-16 LAB — TESTOSTERONE, FREE, TOTAL, SHBG
Sex Hormone Binding: 21.9 nmol/L (ref 19.3–76.4)
TESTOSTERONE FREE: 15.4 pg/mL (ref 7.2–24.0)
TESTOSTERONE: 369 ng/dL (ref 264–916)

## 2016-06-19 ENCOUNTER — Encounter: Payer: Self-pay | Admitting: Podiatry

## 2016-06-19 ENCOUNTER — Telehealth: Payer: Self-pay

## 2016-06-19 ENCOUNTER — Ambulatory Visit (INDEPENDENT_AMBULATORY_CARE_PROVIDER_SITE_OTHER): Payer: BLUE CROSS/BLUE SHIELD | Admitting: Podiatry

## 2016-06-19 DIAGNOSIS — L84 Corns and callosities: Secondary | ICD-10-CM | POA: Diagnosis not present

## 2016-06-19 DIAGNOSIS — E1149 Type 2 diabetes mellitus with other diabetic neurological complication: Secondary | ICD-10-CM

## 2016-06-19 DIAGNOSIS — M79676 Pain in unspecified toe(s): Secondary | ICD-10-CM | POA: Diagnosis not present

## 2016-06-19 DIAGNOSIS — B351 Tinea unguium: Secondary | ICD-10-CM

## 2016-06-19 NOTE — Progress Notes (Signed)
Patient ID: Robert [name], male   DOB: [birth year], 55 y.o.   MRN: [id number] Complaint:  Visit Type: Patient returns to my office for continued preventative foot care services. Complaint: Patient states" my nails have grown long and thick and become painful to walk and wear shoes" Patient has been diagnosed with DM and is taking dialysis and neuropathy.  . The patient presents for preventative foot care services. No changes to ROS.  Patient says he has painful area on the bottom of his right foot and asks for it to be evaluated.  Patient says he is pleased with his shoes.  Podiatric Exam: Vascular: dorsalis pedis and posterior tibial pulses are palpable bilateral. Capillary return is immediate. Temperature gradient is WNL. Skin turgor WNL  Sensorium: Diminished  Semmes Weinstein monofilament test. Normal tactile sensation bilaterally. Nail Exam: Pt has thick disfigured discolored nails with subungual debris noted bilateral entire nail hallux through fifth toenails Ulcer Exam: There is no evidence of ulcer or pre-ulcerative changes or infection. Orthopedic Exam: Muscle tone and strength are WNL. No limitations in general ROM. No crepitus or effusions noted. Foot type and digits show no abnormalities. Bony prominences are unremarkable. HAV  B/L Skin: No Porokeratosis. No infection or ulcers.  Blackened callus lesion on bottom right foot under 4th metabase region.  No redness or swelling noted.  Diagnosis:  Onychomycosis, , Pain in right toe, pain in left toes, Diabetic neuropathy  Hallux limitus 1st MPJ  B/L  Porokeratosis right foot  Treatment & Plan Procedures and Treatment: Consent by patient was obtained for treatment procedures. The patient understood the discussion of treatment and procedures well. All questions were answered thoroughly reviewed. Debridement of mycotic and hypertrophic toenails, 1 through 5 bilateral and clearing of subungual debris. No ulceration, no infection noted. Initiate  diabetic shoes with peripheral neuropathy and hallux limitus. Return Visit-Office Procedure: Patient instructed to return to the office for a follow up visit 3 months for continued evaluation and treatment.    Gardiner Barefoot DPM

## 2016-06-19 NOTE — Telephone Encounter (Signed)
-----   Message from [name], MD sent at 06/19/2016  7:16 AM EDT ----- Regarding: RE: ASA IV, difficult intubation Ok, thanks   [place], He needs colonoscopy./EGD at hospital instead of [place] [facility].  See below.  Next available [facility] Thursday (28th I think).  Thanks  DJ   ----- Message ----- From: Osvaldo Angst, CRNA Sent: 06/18/2016   6:05 PM To: [name], MD Subject: ASA IV, difficult intubation                   Doc,  This pt was seen by Amy and schedule for a procedure with you on 9/19.  Unfortunately he has a hx of difficult intubation and does not qualify for anesthetic care at [facility].  Thanks,  Osvaldo Angst

## 2016-06-20 ENCOUNTER — Encounter: Payer: Self-pay | Admitting: Endocrinology

## 2016-06-20 ENCOUNTER — Ambulatory Visit (INDEPENDENT_AMBULATORY_CARE_PROVIDER_SITE_OTHER): Payer: BLUE CROSS/BLUE SHIELD | Admitting: Endocrinology

## 2016-06-20 VITALS — BP 150/86 | HR 76 | Ht 72.0 in | Wt 301.0 lb

## 2016-06-20 DIAGNOSIS — E782 Mixed hyperlipidemia: Secondary | ICD-10-CM | POA: Diagnosis not present

## 2016-06-20 DIAGNOSIS — E063 Autoimmune thyroiditis: Secondary | ICD-10-CM

## 2016-06-20 DIAGNOSIS — Z794 Long term (current) use of insulin: Secondary | ICD-10-CM | POA: Diagnosis not present

## 2016-06-20 DIAGNOSIS — E038 Other specified hypothyroidism: Secondary | ICD-10-CM | POA: Diagnosis not present

## 2016-06-20 DIAGNOSIS — E1165 Type 2 diabetes mellitus with hyperglycemia: Secondary | ICD-10-CM

## 2016-06-20 NOTE — Patient Instructions (Signed)
Check blood sugars on waking up  3x per week  Also check blood sugars about 2 hours after a meal and do this after different meals by rotation  Recommended blood sugar levels on waking up is 90-120 and about 2 hours after meal is 130-160  Please bring your blood sugar monitor to each visit, thank you  Tresiba 28 units, keep am sugar in range

## 2016-06-20 NOTE — Progress Notes (Signed)
Patient ID: Robert [name], male   DOB: 1961-05-06, 55 y.o.   MRN: [id number]   Reason for Appointment: Type II Diabetes follow-up   History of Present Illness   Diagnosis date:  2012  Previous history: His blood sugar has been difficult to control since onset. Initially was taking oral hypoglycemic drugs like glyburide but could not take metformin because of renal dysfunction. Since Victoza did not help his control he was started on Lantus and then U 500 insulin also His A1c has previously ranged from 7.8-9.8, the lowest level in 03/2013 He was on Victoza but because of his markedly decreased appetite and weight loss this was stopped in 9/15  Recent history:   Insulin regimen: Tresiba 30 units In a.m.  U-500 insulin with syringe: Bfst 0-10 units, 5-10 units at lunch, 8-20 units acs  His A1c is usually lower than expected for his blood sugars and usually below 6 However fructosamine is looking better on this visit, improved at 305  Current blood sugar patterns, management and problems identified:  Fasting blood sugars have been fairly good although not checking regularly.  Also has had at least one low blood sugar within the last 2-3 weeks waking up  He has had some less variability in his blood sugars with continuing [name]  He is adjusting his mealtime insulin based on what he is eating and appears to be usually able to cut his sugars controlled  He is checking postprandial readings periodically after all meals and generally fairly good, only a couple of readings over 200 recently  He has better compliance with his mealtime insulin by taking the syringe with him when he is not at home  Although he is trying to take the insulin 30 minutes before eating he does not always do this for practical reasons  Still not able to afford the U-500 insulin pen  He is trying to eat more salads and also to reduce portions in order to lose weight but this has been difficult to  achieve  FRUCTOSAMINE is still mildly increased but do not see any consistently high readings  His trying to walk more regularly   Proper timing of medications in relation to meals: Yes.          Monitors blood glucose: Once a day or less.    Glucometer: One Probation officer.          Blood Glucose readings by   Mean values apply above for all meters except median for One Touch  PRE-MEAL Fasting Lunch Dinner Bedtime Overall  Glucose range: 39-127       Mean/median: 90     127   POST-MEAL PC Breakfast PC Lunch PC Dinner  Glucose range: 98-177  96-232  97-200   Mean/median:      :  Meals: 2-3 meals per day. 8 am and Noon and Dinner 8-9 PM .   Is on the low phosphorus diet and has fewer carbohydrates, not eating cereals        Physical activity: exercise: walking, nearly everyday         Weight control:  Wt Readings from Last 3 Encounters:  06/20/16 (!) 301 lb (136.5 kg)  06/12/16 298 lb 2 oz (135.2 kg)  03/22/16 (!) 304 lb (528.4 kg)        Complications:   nephropathy, neuropathy   Diabetes labs:   Lab Results  Component Value Date   HGBA1C 5.9 06/14/2016   HGBA1C 5.7 12/15/2015  HGBA1C 5.8 09/16/2015   Lab Results  Component Value Date   MICROALBUR 224.5 Repeated and verified X2. (H) 01/04/2014   LDLCALC 87 06/14/2016   CREATININE 10.76 (H) 05/13/2015   Lab on 06/14/2016  Component Date Value Ref Range Status  . Hgb A1c MFr Bld 06/14/2016 5.9  4.6 - 6.5 % Final  . Fructosamine 06/16/2016 305* 0 - 285 umol/L Final   Comment: Published reference interval for apparently healthy subjects between age 68 and 98 is 61 - 285 umol/L and in a poorly controlled diabetic population is 228 - 563 umol/L with a mean of 396 umol/L.   Marland Kitchen Cholesterol 06/14/2016 147  0 - 200 mg/dL Final  . Triglycerides 06/14/2016 119.0  0.0 - 149.0 mg/dL Final  . HDL 06/14/2016 36.70* >39.00 mg/dL Final  . VLDL 06/14/2016 23.8  0.0 - 40.0 mg/dL Final  . LDL Cholesterol 06/14/2016 87  0 - 99  mg/dL Final  . Total CHOL/HDL Ratio 06/14/2016 4   Final  . NonHDL 06/14/2016 110.43   Final  . Glucose, Bld 06/14/2016 132* 70 - 99 mg/dL Final  . TSH 06/14/2016 2.11  0.35 - 4.50 uIU/mL Final  . Testosterone 06/16/2016 369  264 - 916 ng/dL Final   Comment: Adult male reference interval is based on a population of healthy nonobese males (BMI <30) between 54 and 33 years old. [place], [place] [phone]. PMID: [id number].   Marland Kitchen Testosterone, Free 06/16/2016 15.4  7.2 - 24.0 pg/mL Final  . Sex Hormone Binding 06/16/2016 21.9  19.3 - 76.4 nmol/L Final       Medication List       Accurate as of 06/20/16 11:59 PM. Always use your most recent med list.          aspirin 325 MG tablet Take 325 mg by mouth daily.   BELSOMRA 20 MG Tabs Generic drug:  Suvorexant   calcitRIOL 0.25 MCG capsule Commonly known as:  ROCALTROL Take 0.75 mcg by mouth 3 (three) times a week. DURING DIALYSIS   calcium acetate 667 MG capsule Commonly known as:  PHOSLO Take 667 mg by mouth 3 (three) times daily with meals.   cetirizine 10 MG tablet Commonly known as:  ZYRTEC Take 10 mg by mouth every morning.   docusate sodium 100 MG capsule Commonly known as:  COLACE Take 100 mg by mouth daily.   DULoxetine 30 MG capsule Commonly known as:  CYMBALTA   ethyl chloride spray USE PRE DIALYSIS AS NEEDED   fluocinonide cream 0.05 % Commonly known as:  LIDEX Apply 1 application topically daily as needed (Skin inflammation (hand, thigh, back)).   FOSRENOL 1000 MG chewable tablet Generic drug:  lanthanum Chew 1,000-2,000 mg by mouth 4 (four) times daily. 2000 mg with every meal and 1000 mg with snack   gabapentin 100 MG capsule Commonly known as:  NEURONTIN   glucose blood test strip Commonly known as:  ONETOUCH VERIO Use as instructed to check blood sugar 6 a day dx code E11.29   HUMULIN R 500 UNIT/ML injection Generic drug:  insulin regular human CONCENTRATED INJECT 12 UNITS IN THE  AM AND 18 UNITS AT SUPPER   Insulin Pen Needle 31G X 5 MM Misc Use 4 per day to inject insulin.   INSULIN SYRINGE 1CC/31GX5/16" 31G X 5/16" 1 ML Misc Use to inject insulin   levothyroxine 100 MCG tablet Commonly known as:  SYNTHROID, LEVOTHROID TAKE 1 TABLET (100 MCG TOTAL) BY MOUTH DAILY.   lidocaine-prilocaine cream Commonly  known as:  EMLA APPLY SMALL AMOUNT TO DIALYSIS SITE 30 -60 MIUNTES PRIOR TO DIALYSIS 3X A WEEK   nebivolol 5 MG tablet Commonly known as:  BYSTOLIC Take 1 tablet daily on non dialysis days   polyethylene glycol packet Commonly known as:  MIRALAX / GLYCOLAX Take 17 g by mouth daily.   rosuvastatin 10 MG tablet Commonly known as:  CRESTOR Take 1 tablet (10 mg total) by mouth daily.   senna 8.6 MG tablet Commonly known as:  SENOKOT Take 1 tablet by mouth daily.   SENSIPAR 60 MG tablet Generic drug:  cinacalcet Take 60 mg by mouth daily.   traMADol 50 MG tablet Commonly known as:  ULTRAM Take 1 tablet (50 mg total) by mouth every 6 (six) hours as needed.   TRESIBA FLEXTOUCH 100 UNIT/ML Sopn Generic drug:  Insulin Degludec Inject 30 Units into the skin daily.   zolpidem 10 MG tablet Commonly known as:  AMBIEN Take 10 mg by mouth at bedtime as needed for sleep.       Allergies: No Known Allergies  Past Medical History:  Diagnosis Date  . Anemia    low hgb at present  . Arthritis    FEET  . Blood transfusion without reported diagnosis 1974   kidney removed - L  . Cancer ([place])    skin ca right shoulder, plastic dsyplasia, pre-Ca polpys removed on Colonoscopy- 07/2014  . Colon polyps 07/23/2014   Tubular adenomas x 5  . Constipation   . CPAP (continuous positive airway pressure) dependence   . Diabetes mellitus without complication (HCC)    Type 2  . Difficult intubation    unsure of actual problem but it was during the January 28, 2015 procedure.  Marland Kitchen Dysrhythmia   . Eczema   . ESRD (end stage renal disease) ([place])    hEMO mwf  .  GERD (gastroesophageal reflux disease)    n/v a lot  . Headache(784.0)    slight dull h/a due kidney failure  . Hyperlipidemia   . Hypertension    SVT h/o , followed by Dr. Claiborne Billings  . Hypothyroidism   . Macular degeneration   . Neuropathy (H[facility])    right hand and both feet  . PAT (paroxysmal atrial tachycardia) ([place])   . PSVT (paroxysmal supraventricular tachycardia) ([place])   . SBO (small bowel obstruction) ([place])   . Shortness of breath   . Sleep apnea 06/18/11   [facility] BiPAP- no oxygen  . Thyroid disease   . Umbilical hernia    will repair with thi surgery    Past Surgical History:  Procedure Laterality Date  . AV FISTULA PLACEMENT Right 02/25/2015   Procedure: RIGHT ARTERIOVENOUS (AV) FISTULA CREATION;  Surgeon: Serafina Mitchell, MD;  Location: [facility];  Service: Vascular;  Laterality: Right;  . AV FISTULA PLACEMENT Right 09/15/2015   Procedure: ARTERIOVENOUS (AV) FISTULA CREATION- RIGHT ARM;  Surgeon: Serafina Mitchell, MD;  Location: [facility];  Service: Vascular;  Laterality: Right;  . CAPD INSERTION N/A 05/18/2014   Procedure: LAPAROSCOPIC INSERTION CONTINUOUS AMBULATORY PERITONEAL DIALYSIS  (CAPD) CATHETER;  Surgeon: Ralene Ok, MD;  Location: [facility];  Service: General;  Laterality: N/A;  . COLONOSCOPY    . FISTULA SUPERFICIALIZATION Right 02/07/2016   Procedure: RIIGHT UPPER ARM FISTULA SUPERFICIALIZATION;  Surgeon: Elam Dutch, MD;  Location: [facility];  Service: Vascular;  Laterality: Right;  . IJ catheter insertion    . INSERTION OF DIALYSIS  CATHETER Right 02/25/2015   Procedure: INSERTION OF RIGHT INTERNAL JUGULAR DIALYSIS CATHETER;  Surgeon: Serafina Mitchell, MD;  Location: [facility];  Service: Vascular;  Laterality: Right;  . LAPAROSCOPIC REPOSITIONING CAPD CATHETER N/A 06/16/2014   Procedure: LAPAROSCOPIC REPOSITIONING CAPD CATHETER;  Surgeon: Ralene Ok, MD;  Location: [facility];  Service: General;  Laterality: N/A;  .  LAPAROSCOPY N/A 05/04/2015   Procedure: LAPAROSCOPY DIAGNOSTIC LYSIS OF ADHESIONS;  Surgeon: Coralie Keens, MD;  Location: [facility];  Service: General;  Laterality: N/A;  . MINOR REMOVAL OF PERITONEAL DIALYSIS CATHETER N/A 04/28/2015   Procedure:  REMOVAL OF PERITONEAL DIALYSIS CATHETER;  Surgeon: Coralie Keens, MD;  Location: [facility];  Service: General;  Laterality: N/A;  . NEPHRECTOMY Left 1974  . RENAL BIOPSY Right 2012  . REVISON OF ARTERIOVENOUS FISTULA Right 05/26/2015   Procedure: SUPERFICIALIZATION OF ARTERIOVENOUS FISTULA WITH SIDE BRANCH LIGATIONS;  Surgeon: Serafina Mitchell, MD;  Location: [facility];  Service: Vascular;  Laterality: Right;  . SKIN CANCER EXCISION     right shoulder  . UMBILICAL HERNIA REPAIR N/A 05/18/2014   Procedure: HERNIA REPAIR UMBILICAL ADULT;  Surgeon: Ralene Ok, MD;  Location: [facility];  Service: General;  Laterality: N/A;  . UMBILICAL HERNIA REPAIR N/A 04/28/2015   Procedure: UMBILICAL HERNIA REPAIR WITH MESH;  Surgeon: Coralie Keens, MD;  [facility];  Service: General;  Laterality: N/A;    Family History  Problem Relation Age of Onset  . Colon polyps Father   . Colon cancer Neg Hx   . Stomach cancer Neg Hx     Social History:  reports that he quit smoking about 3 years ago. His smoking use included Cigars. He has never used smokeless tobacco. He reports that he drinks about 0.6 oz of alcohol per week . He reports that he does not use drugs.  Review of Systems:  Hypertension:  BP is variably controlled and he tends to have low readings during dialysis Periodically  His blood pressure has been managed by his nephrologist   BP Readings from Last 3 Encounters:  06/20/16 (!) 150/86  06/12/16 118/70  03/22/16 134/76     Lipids: He has much better levels previously, had been off his Crestor on his last visit in this was started at 10 mg His LDL is below 100 with 10 mg Crestor Has no evidence of vascular disease    Lab Results  Component Value  Date   CHOL 147 06/14/2016   CHOL 161 03/15/2016   CHOL 219 (H) 12/15/2015   Lab Results  Component Value Date   HDL 36.70 (L) 06/14/2016   HDL 37.70 (L) 03/15/2016   HDL 44.40 12/15/2015   Lab Results  Component Value Date   LDLCALC 87 06/14/2016   LDLCALC 96 03/15/2016   LDLCALC 147 (H) 12/15/2015   Lab Results  Component Value Date   TRIG 119.0 06/14/2016   TRIG 135.0 03/15/2016   TRIG 138.0 12/15/2015   Lab Results  Component Value Date   CHOLHDL 4 06/14/2016   CHOLHDL 4 03/15/2016   CHOLHDL 5 12/15/2015   No results found for: LDLDIRECT   HYPOTHYROIDISM: He has had long-standing hypothyroidism   TSH is normal Again.  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    ASSESSMENT/ PLAN:    Diabetes type 2 with obesity:  See history of present illness for detailed discussion of his current management, problems identified and sugar patterns lately  His blood sugars appear to be very well controlled overall recently and he has monitored blood sugars at various times including after meals He has started to improve his diet as he has been told to lose weight before his kidney transplant He is generally able to adjust his mealtime doses based on how much carbohydrate  he is eating and generally this works out fairly well Usually no hypoglycemia after meals He has had a low sugar fasting and morning readings appear to be near normal  For now he will reduce his [name] by at least 2 units to avoid tendency to overnight hypoglycemia Discussed adjusting this every week or so based on fasting readings  No change in Humulin R as yet as postprandial readings are generally well controlled, again discussed timing of injection and duration of action He will call if he has any significant hypoglycemia Encouraged him to increase walking for exercise  HYPOTHYROIDISM: Adequately replaced  History of hypogonadism: Appears to be resolved  HYPERLIPIDEMIA: Better controlled with 20 mg Crestor, does not have a history of CAD  Patient Instructions  Check blood sugars on waking up  3x per week  Also check blood sugars about 2 hours after a meal and do this after different meals by rotation  Recommended blood sugar levels on waking up is 90-120 and about 2 hours after meal is 130-160  Please bring your blood sugar monitor to each visit, thank you  Tresiba 28 units, keep am sugar in range   Counseling time on subjects discussed above is over 50% of today's 25 minute visit   Mellanie Bejarano 06/21/2016, 12:09 PM

## 2016-06-22 ENCOUNTER — Encounter (HOSPITAL_COMMUNITY): Payer: Self-pay | Admitting: *Deleted

## 2016-06-22 ENCOUNTER — Ambulatory Visit (HOSPITAL_COMMUNITY)
Admission: EM | Admit: 2016-06-22 | Discharge: 2016-06-22 | Disposition: A | Discharge status: Home / Self Care | Payer: Medicare Other | Attending: Family Medicine | Admitting: Family Medicine

## 2016-06-22 DIAGNOSIS — M545 Low back pain: Secondary | ICD-10-CM

## 2016-06-22 MED ORDER — CYCLOBENZAPRINE HCL 5 MG PO TABS: 5.0000 mg | ORAL_TABLET | Freq: Three times a day (TID) | ORAL | 0 refills | Status: DC
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
## 2016-06-25 ENCOUNTER — Telehealth: Payer: Self-pay | Admitting: Gastroenterology

## 2016-06-25 ENCOUNTER — Encounter: Payer: Self-pay | Admitting: Gastroenterology

## 2016-06-25 ENCOUNTER — Other Ambulatory Visit: Payer: Self-pay

## 2016-06-25 DIAGNOSIS — R195 Other fecal abnormalities: Secondary | ICD-10-CM

## 2016-06-25 DIAGNOSIS — Z86010 Personal history of colonic polyps: Secondary | ICD-10-CM

## 2016-06-25 DIAGNOSIS — R112 Nausea with vomiting, unspecified: Secondary | ICD-10-CM

## 2016-06-25 NOTE — Telephone Encounter (Signed)
Called the patient and left a message on the voicemail. Procedures scheduled at the hospital 07/05/16. New instructions mailed.

## 2016-06-25 NOTE — Telephone Encounter (Signed)
Patient contacted through the email.

## 2016-06-26 ENCOUNTER — Encounter: Payer: Self-pay | Admitting: Gastroenterology

## 2016-07-11 ENCOUNTER — Encounter (HOSPITAL_COMMUNITY): Payer: Self-pay | Admitting: *Deleted

## 2016-07-12 ENCOUNTER — Encounter (HOSPITAL_COMMUNITY): Payer: Self-pay

## 2016-07-12 ENCOUNTER — Ambulatory Visit (HOSPITAL_COMMUNITY): Payer: BLUE CROSS/BLUE SHIELD | Admitting: Certified Registered Nurse Anesthetist

## 2016-07-12 ENCOUNTER — Ambulatory Visit (HOSPITAL_COMMUNITY)
Admission: RE | Admit: 2016-07-12 | Discharge: 2016-07-12 | Disposition: A | Discharge status: Home / Self Care | Payer: BLUE CROSS/BLUE SHIELD | Source: Ambulatory Visit | Attending: Gastroenterology | Admitting: Gastroenterology

## 2016-07-12 ENCOUNTER — Encounter (HOSPITAL_COMMUNITY)
Admission: RE | Disposition: A | Discharge status: Home / Self Care | Payer: Self-pay | Source: Ambulatory Visit | Attending: Gastroenterology

## 2016-07-12 DIAGNOSIS — G4733 Obstructive sleep apnea (adult) (pediatric): Secondary | ICD-10-CM | POA: Insufficient documentation

## 2016-07-12 DIAGNOSIS — E785 Hyperlipidemia, unspecified: Secondary | ICD-10-CM | POA: Insufficient documentation

## 2016-07-12 DIAGNOSIS — R195 Other fecal abnormalities: Secondary | ICD-10-CM

## 2016-07-12 DIAGNOSIS — Z860100 Personal history of colon polyps, unspecified: Secondary | ICD-10-CM

## 2016-07-12 DIAGNOSIS — K5730 Diverticulosis of large intestine without perforation or abscess without bleeding: Secondary | ICD-10-CM | POA: Diagnosis not present

## 2016-07-12 DIAGNOSIS — E1122 Type 2 diabetes mellitus with diabetic chronic kidney disease: Secondary | ICD-10-CM | POA: Diagnosis not present

## 2016-07-12 DIAGNOSIS — D123 Benign neoplasm of transverse colon: Secondary | ICD-10-CM | POA: Diagnosis not present

## 2016-07-12 DIAGNOSIS — Z87891 Personal history of nicotine dependence: Secondary | ICD-10-CM | POA: Insufficient documentation

## 2016-07-12 DIAGNOSIS — Z905 Acquired absence of kidney: Secondary | ICD-10-CM | POA: Insufficient documentation

## 2016-07-12 DIAGNOSIS — E039 Hypothyroidism, unspecified: Secondary | ICD-10-CM | POA: Diagnosis not present

## 2016-07-12 DIAGNOSIS — Z992 Dependence on renal dialysis: Secondary | ICD-10-CM | POA: Insufficient documentation

## 2016-07-12 DIAGNOSIS — Z7982 Long term (current) use of aspirin: Secondary | ICD-10-CM | POA: Diagnosis not present

## 2016-07-12 DIAGNOSIS — Z6841 Body Mass Index (BMI) 40.0 and over, adult: Secondary | ICD-10-CM | POA: Insufficient documentation

## 2016-07-12 DIAGNOSIS — Z79899 Other long term (current) drug therapy: Secondary | ICD-10-CM | POA: Diagnosis not present

## 2016-07-12 DIAGNOSIS — N186 End stage renal disease: Secondary | ICD-10-CM | POA: Diagnosis not present

## 2016-07-12 DIAGNOSIS — Z8371 Family history of colonic polyps: Secondary | ICD-10-CM | POA: Insufficient documentation

## 2016-07-12 DIAGNOSIS — Z794 Long term (current) use of insulin: Secondary | ICD-10-CM | POA: Diagnosis not present

## 2016-07-12 DIAGNOSIS — R112 Nausea with vomiting, unspecified: Secondary | ICD-10-CM

## 2016-07-12 DIAGNOSIS — I120 Hypertensive chronic kidney disease with stage 5 chronic kidney disease or end stage renal disease: Secondary | ICD-10-CM | POA: Diagnosis not present

## 2016-07-12 HISTORY — PX: ESOPHAGOGASTRODUODENOSCOPY (EGD) WITH PROPOFOL: SHX5813

## 2016-07-12 HISTORY — PX: COLONOSCOPY WITH PROPOFOL: SHX5780

## 2016-07-12 LAB — POCT I-STAT 4, (NA,K, GLUC, HGB,HCT)
Glucose, Bld: 127 mg/dL — ABNORMAL HIGH (ref 65–99)
HCT: 39 % (ref 39.0–52.0)
HEMOGLOBIN: 13.3 g/dL (ref 13.0–17.0)
POTASSIUM: 4.4 mmol/L (ref 3.5–5.1)
SODIUM: 138 mmol/L (ref 135–145)

## 2016-07-12 SURGERY — ESOPHAGOGASTRODUODENOSCOPY (EGD) WITH PROPOFOL
Anesthesia: Monitor Anesthesia Care

## 2016-07-12 MED ORDER — LIDOCAINE 2% (20 MG/ML) 5 ML SYRINGE
INTRAMUSCULAR | Status: DC | PRN
  Administered 2016-07-12: 100 mg via INTRAVENOUS

## 2016-07-12 MED ORDER — PHENYLEPHRINE 40 MCG/ML (10ML) SYRINGE FOR IV PUSH (FOR BLOOD PRESSURE SUPPORT)
PREFILLED_SYRINGE | INTRAVENOUS | Status: AC
  Filled 2016-07-12: qty 10

## 2016-07-12 MED ORDER — SODIUM CHLORIDE 0.9 % IV SOLN
INTRAVENOUS | Status: DC
  Administered 2016-07-12: 1000 mL via INTRAVENOUS

## 2016-07-12 MED ORDER — LIDOCAINE 2% (20 MG/ML) 5 ML SYRINGE
INTRAMUSCULAR | Status: AC
  Filled 2016-07-12: qty 5

## 2016-07-12 MED ORDER — PROPOFOL 10 MG/ML IV BOLUS
INTRAVENOUS | Status: DC | PRN
  Administered 2016-07-12: 10 mg via INTRAVENOUS
  Administered 2016-07-12 (×2): 20 mg via INTRAVENOUS
  Administered 2016-07-12: 30 mg via INTRAVENOUS
  Administered 2016-07-12 (×2): 20 mg via INTRAVENOUS

## 2016-07-12 MED ORDER — SODIUM CHLORIDE 0.9 % IV SOLN: INTRAVENOUS | Status: DC

## 2016-07-12 MED ORDER — ONDANSETRON HCL 4 MG/2ML IJ SOLN
INTRAMUSCULAR | Status: DC | PRN
  Administered 2016-07-12: 4 mg via INTRAVENOUS

## 2016-07-12 MED ORDER — PHENYLEPHRINE 40 MCG/ML (10ML) SYRINGE FOR IV PUSH (FOR BLOOD PRESSURE SUPPORT)
PREFILLED_SYRINGE | INTRAVENOUS | Status: DC | PRN
  Administered 2016-07-12: 80 ug via INTRAVENOUS

## 2016-07-12 MED ORDER — ONDANSETRON HCL 4 MG/2ML IJ SOLN
INTRAMUSCULAR | Status: AC
  Filled 2016-07-12: qty 2

## 2016-07-12 MED ORDER — PROPOFOL 500 MG/50ML IV EMUL
INTRAVENOUS | Status: DC | PRN
  Administered 2016-07-12: 50 ug/kg/min via INTRAVENOUS

## 2016-07-12 MED ORDER — PROPOFOL 10 MG/ML IV BOLUS
INTRAVENOUS | Status: AC
  Filled 2016-07-12: qty 60

## 2016-07-12 SURGICAL SUPPLY — 25 items

## 2016-07-12 NOTE — Transfer of Care (Signed)
Immediate Anesthesia Transfer of Care Note  Patient: ARKEEM HARTS  Procedure(s) Performed: Procedure(s): ESOPHAGOGASTRODUODENOSCOPY (EGD) WITH PROPOFOL (N/A) COLONOSCOPY WITH PROPOFOL (N/A)  Patient Location: ENDO  Anesthesia Type:MAC  Level of Consciousness:  sedated, patient cooperative and responds to stimulation  Airway & Oxygen Therapy:Patient Spontanous Breathing and Patient connected to face mask oxgen  Post-op Assessment:  Report given to ENDO RN and Post -op Vital signs reviewed and stable  Post vital signs:  Reviewed and stable  Last Vitals:  Vitals:   07/12/16 0721 07/12/16 0835  BP: 111/69 (!) 94/48  Pulse: 82 73  Resp: 17 11  Temp: 36.8 C 26.7 C    Complications: No apparent anesthesia complications

## 2016-07-12 NOTE — Anesthesia Preprocedure Evaluation (Signed)
Anesthesia Evaluation  Patient identified by MRN, date of birth, ID band Patient awake    Reviewed: Allergy & Precautions, NPO status , Patient's Chart, lab work & pertinent test results  History of Anesthesia Complications (+) DIFFICULT AIRWAY and history of anesthetic complications  Airway Mallampati: II   Neck ROM: full    Dental   Pulmonary sleep apnea , former smoker,    breath sounds clear to auscultation       Cardiovascular hypertension, + dysrhythmias Supra Ventricular Tachycardia  Rhythm:regular Rate:Normal     Neuro/Psych  Headaches,    GI/Hepatic   Endo/Other  diabetes, Type 2Hypothyroidism Morbid obesity  Renal/GU ESRF and DialysisRenal disease     Musculoskeletal  (+) Arthritis ,   Abdominal   Peds  Hematology   Anesthesia Other Findings   Reproductive/Obstetrics                             Anesthesia Physical Anesthesia Plan  ASA: IV  Anesthesia Plan: MAC   Post-op Pain Management:    Induction: Intravenous  Airway Management Planned: Nasal Cannula  Additional Equipment:   Intra-op Plan:   Post-operative Plan:   Informed Consent: I have reviewed the patients History and Physical, chart, labs and discussed the procedure including the risks, benefits and alternatives for the proposed anesthesia with the patient or authorized representative who has indicated his/her understanding and acceptance.     Plan Discussed with: CRNA, Anesthesiologist and Surgeon  Anesthesia Plan Comments:         Anesthesia Quick Evaluation

## 2016-07-12 NOTE — Interval H&P Note (Signed)
History and Physical Interval Note:  07/12/2016 7:24 AM  Robert [name]  has presented today for surgery, with the diagnosis of nausea/vomiting heme positive stool colon polyps  The various methods of treatment have been discussed with the patient and family. After consideration of risks, benefits and other options for treatment, the patient has consented to  Procedure(s): ESOPHAGOGASTRODUODENOSCOPY (EGD) WITH PROPOFOL (N/A) COLONOSCOPY WITH PROPOFOL (N/A) as a surgical intervention .  The patient's history has been reviewed, patient examined, no change in status, stable for surgery.  I have reviewed the patient's chart and labs.  Questions were answered to the patient's satisfaction.     [name]

## 2016-07-12 NOTE — Discharge Instructions (Signed)
YOU HAD AN ENDOSCOPIC PROCEDURE TODAY: Refer to the procedure report and other information in the discharge instructions given to you for any specific questions about what was found during the examination. If this information does not answer your questions, please call [place] office at [phone] to clarify.  ° °YOU SHOULD EXPECT: Some feelings of bloating in the abdomen. Passage of more gas than usual. Walking can help get rid of the air that was put into your GI tract during the procedure and reduce the bloating. If you had a lower endoscopy (such as a colonoscopy or flexible sigmoidoscopy) you may notice spotting of blood in your stool or on the toilet paper. Some abdominal soreness may be present for a day or two, also. ° °DIET: Your first meal following the procedure should be a light meal and then it is ok to progress to your normal diet. A half-sandwich or bowl of soup is an example of a good first meal. Heavy or fried foods are harder to digest and may make you feel nauseous or bloated. Drink plenty of fluids but you should avoid alcoholic beverages for 24 hours. If you had a esophageal dilation, please see attached instructions for diet.   ° °ACTIVITY: Your care partner should take you home directly after the procedure. You should plan to take it easy, moving slowly for the rest of the day. You can resume normal activity the day after the procedure however YOU SHOULD NOT DRIVE, use power tools, machinery or perform tasks that involve climbing or major physical exertion for 24 hours (because of the sedation medicines used during the test).  ° °SYMPTOMS TO REPORT IMMEDIATELY: °A gastroenterologist can be reached at any hour. Please call [phone]  for any of the following symptoms:  °Following lower endoscopy (colonoscopy, flexible sigmoidoscopy) °Excessive amounts of blood in the stool  °Significant tenderness, worsening of abdominal pains  °Swelling of the abdomen that is new, acute  °Fever of 100° or  higher  °Following upper endoscopy (EGD, EUS, ERCP, esophageal dilation) °Vomiting of blood or coffee ground material  °New, significant abdominal pain  °New, significant chest pain or pain under the shoulder blades  °Painful or persistently difficult swallowing  °New shortness of breath  °Black, tarry-looking or red, bloody stools ° °FOLLOW UP:  °If any biopsies were taken you will be contacted by phone or by letter within the next 1-3 weeks. Call [phone]  if you have not heard about the biopsies in 3 weeks.  °Please also call with any specific questions about appointments or follow up tests. ° °

## 2016-07-12 NOTE — Op Note (Signed)
[facility] Patient Name: Robert [name] Procedure Date: 07/12/2016 MRN: [id number] Attending MD: [name] , MD Date of Birth: 02/07/1961 CSN: [id number] Age: 55 Admit Type: Outpatient Procedure:                Colonoscopy Indications:              Heme positive stool (recent stool testing by PCP):                            colonoscopy 07/2014 Dr. Ardis Hughs found 3 subCM                            adenomas Providers:                [name], MD, Dortha Schwalbe RN, RN,                            William Dalton, Technician Referring MD:              Medicines:                Monitored Anesthesia Care Complications:            No immediate complications. Estimated blood loss:                            None. Estimated Blood Loss:     Estimated blood loss: none. Procedure:                Pre-Anesthesia Assessment:                           - Prior to the procedure, a History and Physical                            was performed, and patient medications and                            allergies were reviewed. The patient's tolerance of                            previous anesthesia was also reviewed. The risks                            and benefits of the procedure and the sedation                            options and risks were discussed with the patient.                            All questions were answered, and informed consent                            was obtained. Prior Anticoagulants: The patient has                            taken no previous anticoagulant  or antiplatelet                            agents. ASA Grade Assessment: III - A patient with                            severe systemic disease. After reviewing the risks                            and benefits, the patient was deemed in                            satisfactory condition to undergo the procedure.                           After obtaining informed consent, the colonoscope                          was passed under direct vision. Throughout the                            procedure, the patient's blood pressure, pulse, and                            oxygen saturations were monitored continuously. The                            EC-[id number] ([id number]) scope was introduced through                            the anus and advanced to the the terminal ileum,                            identified by appendiceal orifice and ileocecal                            valve. The colonoscopy was performed without                            difficulty. The patient tolerated the procedure                            well. The quality of the bowel preparation was                            good. The ileocecal valve, appendiceal orifice, and                            rectum were photographed. Scope In: 8:00:00 AM Scope Out: 8:17:20 AM Scope Withdrawal Time: 0 hours 8 minutes 20 seconds  Total Procedure Duration: 0 hours 17 minutes 20 seconds  Findings:      A 4 mm polyp was found in the transverse colon. The polyp was sessile.       The polyp was removed with a cold snare. Resection  and retrieval were       complete.      Multiple small-mouthed diverticula were found in the left colon.      The terminal ileum was normal.      The exam was otherwise without abnormality on direct and retroflexion       views. Impression:               - One 4 mm polyp in the transverse colon, removed                            with a cold snare. Resected and retrieved.                           - Diverticulosis in the left colon.                           - Normal terminal ileum.                           - The examination was otherwise normal on direct                            and retroflexion views. Moderate Sedation:      N/A- Per Anesthesia Care Recommendation:           - Patient has a contact number available for                            emergencies. The signs and symptoms of potential                             delayed complications were discussed with the                            patient. Return to normal activities tomorrow.                            Written discharge instructions were provided to the                            patient.                           - Resume previous diet.                           - Continue present medications.                           You will receive a letter within 2-3 weeks with the                            pathology results and my final recommendations.                           If the polyp(s) is proven to be 'pre-cancerous' on  pathology, you will need repeat colonoscopy in 5                            years without need for colon cancer screening by                            any method prior to then (including stool testing). Procedure Code(s):        --- Professional ---                           [phone], Colonoscopy, flexible; with removal of                            tumor(s), polyp(s), or other lesion(s) by snare                            technique Diagnosis Code(s):        --- Professional ---                           D12.3, Benign neoplasm of transverse colon (hepatic                            flexure or splenic flexure)                           R19.5, Other fecal abnormalities                           K57.30, Diverticulosis of large intestine without                            perforation or abscess without bleeding CPT copyright 2016 [employer]. All rights reserved. The codes documented in this report are preliminary and upon coder review may  be revised to meet current compliance requirements. [name], MD 07/12/2016 8:21:48 AM This report has been signed electronically. Number of Addenda: 0

## 2016-07-12 NOTE — Op Note (Signed)
[facility] Patient Name: Robert [name] Procedure Date: 07/12/2016 MRN: [id number] Attending MD: [name] , MD Date of Birth: 1961-03-20 CSN: [id number] Age: 55 Admit Type: Outpatient Procedure:                Upper GI endoscopy Indications:              Heme positive stool Providers:                [name], MD, Dortha Schwalbe RN, RN,                            William Dalton, Technician Referring MD:              Medicines:                Monitored Anesthesia Care Complications:            No immediate complications. Estimated blood loss:                            None. Estimated Blood Loss:     Estimated blood loss: none. Procedure:                Pre-Anesthesia Assessment:                           - Prior to the procedure, a History and Physical                            was performed, and patient medications and                            allergies were reviewed. The patient's tolerance of                            previous anesthesia was also reviewed. The risks                            and benefits of the procedure and the sedation                            options and risks were discussed with the patient.                            All questions were answered, and informed consent                            was obtained. Prior Anticoagulants: The patient has                            taken no previous anticoagulant or antiplatelet                            agents. ASA Grade Assessment: III - A patient with  severe systemic disease. After reviewing the risks                            and benefits, the patient was deemed in                            satisfactory condition to undergo the procedure.                           - Prior to the procedure, a History and Physical                            was performed, and patient medications and                            allergies were reviewed. The patient's  tolerance of                            previous anesthesia was also reviewed. The risks                            and benefits of the procedure and the sedation                            options and risks were discussed with the patient.                            All questions were answered, and informed consent                            was obtained. Prior Anticoagulants: The patient has                            taken no previous anticoagulant or antiplatelet                            agents. ASA Grade Assessment: III - A patient with                            severe systemic disease. After reviewing the risks                            and benefits, the patient was deemed in                            satisfactory condition to undergo the procedure.                           After obtaining informed consent, the endoscope was                            passed under direct vision. Throughout the  procedure, the patient's blood pressure, pulse, and                            oxygen saturations were monitored continuously. The                            Endoscope was introduced through the mouth, and                            advanced to the second part of duodenum. The upper                            GI endoscopy was accomplished without difficulty.                            The patient tolerated the procedure well. Findings:      The esophagus was normal.      The stomach was normal.      The examined duodenum was normal. Impression:               - Normal UGI tract. Moderate Sedation:      N/A- Per Anesthesia Care Recommendation:           - Patient has a contact number available for                            emergencies. The signs and symptoms of potential                            delayed complications were discussed with the                            patient. Return to normal activities tomorrow.                            Written discharge  instructions were provided to the                            patient.                           - Resume previous diet.                           - Continue present medications.                           - No repeat upper endoscopy. Procedure Code(s):        --- Professional ---                           [id number], Esophagogastroduodenoscopy, flexible,                            transoral; diagnostic, including collection of  specimen(s) by brushing or washing, when performed                            (separate procedure) Diagnosis Code(s):        --- Professional ---                           R19.5, Other fecal abnormalities CPT copyright 2016 American [employer]. All rights reserved. The codes documented in this report are preliminary and upon coder review may  be revised to meet current compliance requirements. [name], MD 07/12/2016 8:30:19 AM This report has been signed electronically. Number of Addenda: 0

## 2016-07-12 NOTE — Anesthesia Postprocedure Evaluation (Signed)
Anesthesia Post Note  Patient: Robert [name]  Procedure(s) Performed: Procedure(s) (LRB): ESOPHAGOGASTRODUODENOSCOPY (EGD) WITH PROPOFOL (N/A) COLONOSCOPY WITH PROPOFOL (N/A)  Patient location during evaluation: PACU Anesthesia Type: MAC Level of consciousness: awake and alert Pain management: pain level controlled Vital Signs Assessment: post-procedure vital signs reviewed and stable Respiratory status: spontaneous breathing, nonlabored ventilation, respiratory function stable and patient connected to nasal cannula oxygen Cardiovascular status: stable and blood pressure returned to baseline Anesthetic complications: no    Last Vitals:  Vitals:   07/12/16 0721 07/12/16 0835  BP: 111/69 (!) 94/48  Pulse: 82 73  Resp: 17 11  Temp: 36.8 C 36.6 C    Last Pain:  Vitals:   07/12/16 0835  TempSrc: Oral                 Hardeep Reetz S

## 2016-07-12 NOTE — H&P (View-Only) (Signed)
Subjective:    Patient ID: Robert [name], male    DOB: Mar 06, 1961, 55 y.o.   MRN: [id number]  HPI  [name] is a pleasant 55 year old white male known to Dr. Ardis Hughs from previous colonoscopy who is referred today by Dr. Odette Fraction  for Hemoccult-positive stool and consideration of colonoscopy. Patient has history of end-stage renal disease ,is on dialysis and is in the midst of undergoing transplant evaluation through [facility]. He has a consultation on September 14. Patient had a remote unilateral nephrectomy as a teenager for a nonfunctioning kidney. He has history of hypertension, SVT, diabetes mellitus, morbid obesity, and severe sleep apnea for which he uses a BiPAP. Patient says he had a recent physical and did Hemoccults as part of that which returned positive. He himself has not seen any blood in his stools, no melena ,no changes in his bowel habits and has no complaints of abdominal pain. He denies any heartburn ,indigestion ,dysphagia   He had been taking a 325 mg aspirin for several months which he had recently stopped. He is not on any NSAIDs.  Last colonoscopy was done in October 2015 for screening- he had 3 semi-pedunculated polyps measuring between 5 and 9 mm all of which were removed. Also had mild diverticulosis,  no hemorrhoids. He was planned  for 3 year interval follow-up for path showing  tubular adenomas.  Review of Systems Pertinent positive and negative review of systems were noted in the above HPI section.  All other review of systems was otherwise negative.  Outpatient Encounter Prescriptions as of 06/12/2016  Medication Sig  . aspirin 325 MG tablet Take 325 mg by mouth daily.  . BELSOMRA 20 MG TABS   . calcitRIOL (ROCALTROL) 0.25 MCG capsule Take 0.75 mcg by mouth 3 (three) times a week. DURING DIALYSIS  . calcium acetate (PHOSLO) 667 MG capsule Take 667 mg by mouth 3 (three) times daily with meals.  . cetirizine (ZYRTEC) 10 MG tablet Take 10 mg by mouth every  morning.   . docusate sodium (COLACE) 100 MG capsule Take 100 mg by mouth daily.   . DULoxetine (CYMBALTA) 30 MG capsule   . ethyl chloride spray USE PRE DIALYSIS AS NEEDED  . fluocinonide cream (LIDEX) 6.28 % Apply 1 application topically daily as needed (Skin inflammation (hand, thigh, back)).   Robert [name] 1000 MG chewable tablet Chew 1,000-2,000 mg by mouth 4 (four) times daily. 2000 mg with every meal and 1000 mg with snack  . glucose blood (ONETOUCH VERIO) test strip Use as instructed to check blood sugar 6 a day dx code E11.29  . HUMULIN R 500 UNIT/ML injection INJECT 12 UNITS IN THE AM AND 18 UNITS AT SUPPER  . Insulin Degludec (TRESIBA FLEXTOUCH) 100 UNIT/ML SOPN Inject 30 Units into the skin daily.   . Insulin Pen Needle 31G X 5 MM MISC Use 4 per day to inject insulin.  . Insulin Syringe-Needle U-100 (INSULIN SYRINGE 1CC/31GX5/16") 31G X 5/16" 1 ML MISC Use to inject insulin  . levothyroxine (SYNTHROID, LEVOTHROID) 100 MCG tablet TAKE 1 TABLET (100 MCG TOTAL) BY MOUTH DAILY.  Marland Kitchen lidocaine-prilocaine (EMLA) cream APPLY SMALL AMOUNT TO DIALYSIS SITE 30 -60 MIUNTES PRIOR TO DIALYSIS 3X A WEEK  . nebivolol (BYSTOLIC) 5 MG tablet Take 1 tablet daily on non dialysis days (Patient taking differently: 10 mg. Take 1 tablet daily on non dialysis days)  . polyethylene glycol (MIRALAX / GLYCOLAX) packet Take 17 g by mouth daily.  . rosuvastatin (CRESTOR) 10  MG tablet Take 1 tablet (10 mg total) by mouth daily. (Patient taking differently: Take 20 mg by mouth daily. )  . senna (SENOKOT) 8.6 MG tablet Take 1 tablet by mouth daily.  . SENSIPAR 60 MG tablet Take 60 mg by mouth daily.   . traMADol (ULTRAM) 50 MG tablet Take 1 tablet (50 mg total) by mouth every 6 (six) hours as needed.  . zolpidem (AMBIEN) 10 MG tablet Take 10 mg by mouth at bedtime as needed for sleep.  . Na Sulfate-K Sulfate-Mg Sulf 17.5-3.13-1.6 GM/180ML SOLN Take 1 kit by mouth once.   No facility-administered encounter medications  on file as of 06/12/2016.    No Known Allergies Patient Active Problem List   Diagnosis Date Noted  . Morbid obesity ([place]) 02/03/2016  . 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, with long-term current use of insulin ([place]) 07/14/2015  . Nausea & vomiting   . HTN (hypertension) 05/11/2015  . Chronic renal failure   . SBO (small bowel obstruction) ([place]) 05/04/2015  . Protein-calorie malnutrition, severe ([place]) 06/15/2014  . Uremia of renal origin 06/12/2014  . End stage renal disease on dialysis ([place]) 03/22/2014  . Fatigue 01/08/2014  . H/O unilateral nephrectomy 09/06/2013  . Diabetes mellitus type 2 in obese ([place]) 09/06/2013  . SVT (supraventricular tachycardia) ([place]) 09/06/2013  . Lower extremity edema 09/06/2013  . Severe obstructive sleep apnea 09/06/2013  . Hypothyroidism 09/06/2013  . Hyperlipidemia with target LDL less than 70 09/06/2013   Social History   Social History  . Marital status: Married    Spouse name: N/A  . Number of children: 3  . Years of education: N/A   Occupational History  . 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 Smoking status: Former Smoker    Types: Cigars    Quit date: 12/06/2012  . Smokeless tobacco: Never Used  . Alcohol use 0.6 oz/week    1 Glasses of wine per week     Comment: 1 glass of wine per week, rare use as of 05/25/15  . Drug use: No  . Sexual activity: Not on file   Other Topics Concern  . Not on file   Social History Narrative  . No narrative on file    Robert [name]'s family history includes Colon polyps in his father.      Objective:    Vitals:   06/12/16 1334  BP: 118/70  Pulse: 74    Physical Exam well-developed older white male in no acute distress, quite pleasant blood pressure 118/70 pulse 74, height 6 foot weight 298 BMI of 40. HEENT ;nontraumatic normocephalic EOMI PERRLA sclera anicteric, Cardiovascular; regular rate and rhythm with S1-S2, Pulmonary; clear  bilaterally, Abdomen obese soft nontender nondistended bowel sounds are active there is no palpable mass or hepatosplenomegaly, Rectal ;exam not done recently documented Hemoccult positive, Extremities; no clubbing cyanosis or edema skin warm and dry, Neuropsych ;mood and affect appropriate       Assessment & Plan:   #38  55 year old male with history of adenomatous colon polyps referred for Hemoccult-positive stool. Patient is undergoing renal transplant evaluation as well at [facility]. We will rule out occult lower versus upper GI source for Hemoccult-positive stool. #2 diabetes mellitus #3 obesity-BMI 40 #4 hypertension #5 severe sleep apnea -uses BiPAP-no 02  #6 hx  SVT #7 End  stage renal disease on dialysis with Monday Wednesday Friday dialysis sessions.  Plan; Patient is scheduled for colonoscopy and  EGD with Dr. Ardis Hughs. Procedures were discussed in detail with patient including risks and benefits and he is agreeable to proceed. Further plans pending results of endoscopic evaluation.   Amy Genia Harold PA-C 06/12/2016   Cc: [name], [name], MD

## 2016-07-13 ENCOUNTER — Encounter (HOSPITAL_COMMUNITY): Payer: Self-pay | Admitting: Gastroenterology

## 2016-07-20 ENCOUNTER — Telehealth: Payer: Self-pay | Admitting: Cardiovascular Disease

## 2016-07-20 ENCOUNTER — Other Ambulatory Visit: Payer: Self-pay | Admitting: Endocrinology

## 2016-07-20 NOTE — Telephone Encounter (Signed)
Left message to call back- some question to be asked - how long with the machine? -have you had a recent download?

## 2016-07-20 NOTE — Telephone Encounter (Signed)
Spoke  to patient  patient states he contacted his DME company ([employer])- HIS MACHINE IS CUTTING OFF IN THE MIDDLE OF THE NIGHT.- someone at the company stated he can get a new machine with a prescription.  RN informed patient that he will need a face to face before new prescription can be written and possible more recent download. Bring machine in case it can be downloaded. Patient does think it was the beginning of the year. patient verbalized understanding

## 2016-07-20 NOTE — Telephone Encounter (Signed)
New message     FYI Calling to let the nurse know that he needs a new bipap machine.  He has an upcoming appt and is not sure if he needs another sleep study.  Will discuss this at his appt but wanted the nurse to know that he needs a new machine.

## 2016-07-26 ENCOUNTER — Encounter: Payer: Self-pay | Admitting: Cardiovascular Disease

## 2016-07-26 ENCOUNTER — Ambulatory Visit (INDEPENDENT_AMBULATORY_CARE_PROVIDER_SITE_OTHER): Payer: BLUE CROSS/BLUE SHIELD | Admitting: Cardiovascular Disease

## 2016-07-26 VITALS — BP 136/82 | HR 101 | Ht 73.0 in | Wt 297.6 lb

## 2016-07-26 DIAGNOSIS — I471 Supraventricular tachycardia, unspecified: Secondary | ICD-10-CM

## 2016-07-26 DIAGNOSIS — E785 Hyperlipidemia, unspecified: Secondary | ICD-10-CM

## 2016-07-26 DIAGNOSIS — I10 Essential (primary) hypertension: Secondary | ICD-10-CM

## 2016-07-26 DIAGNOSIS — N186 End stage renal disease: Secondary | ICD-10-CM

## 2016-07-26 DIAGNOSIS — E039 Hypothyroidism, unspecified: Secondary | ICD-10-CM

## 2016-07-26 DIAGNOSIS — G4733 Obstructive sleep apnea (adult) (pediatric): Secondary | ICD-10-CM | POA: Diagnosis not present

## 2016-07-26 DIAGNOSIS — Z794 Long term (current) use of insulin: Secondary | ICD-10-CM

## 2016-07-26 DIAGNOSIS — E118 Type 2 diabetes mellitus with unspecified complications: Secondary | ICD-10-CM

## 2016-07-26 MED ORDER — NEBIVOLOL HCL 2.5 MG PO TABS: ORAL_TABLET | ORAL | 6 refills | Status: DC

## 2016-07-26 NOTE — Patient Instructions (Signed)
Your physician has recommended you make the following change in your medication:   1.) The Bystolic has been changed to 2.5 mg on NON-DIALYSIS days.  You have been given a order to take to [facility]  For BIPAP pressure change.  Your physician wants you to follow-up in: 6 months or sooner if needed. You will receive a reminder letter in the mail two months in advance. If you don't receive a letter, please call our office to schedule the follow-up appointment.  If you need a refill on your cardiac medications before your next appointment, please call your pharmacy.

## 2016-07-28 NOTE — Progress Notes (Signed)
Patient ID: Robert [name], male   DOB: 08-22-1961, 55 y.o.   MRN: [id number]     HPI: Robert [name], is a 56 y.o. male who is a former patient of Dr. Terance Ice.  I initially saw him in October 2014 when he established care with me.  He presents for a 6 month follow-up evaluation.  Robert [name]  has a complex medical history that includes a congenital nonfunctioning left kidney for which he underwent left nephrectomy at age 73. He has a history of obesity, diabetes mellitus, and has been demonstrated to have proteinuria of his remaining single kidney, being status post open renal biopsy in [name] in November 2010. This revealed focal segmental glomerular sclerosis. At that time he was seen by Dr. Venita Sheffield in the nephrology department at [facility]. He sees Dr. Moshe Cipro in [place] for his kidney insufficiency and has been on ARB therapy in the past in an attempt to reduce proteinuria. He sees Dr. Dwyane Dee for his diabetes mellitus. He also has a history of hypothyroidism, hyperlipidemia, anxiety and depression. He has a history of prior supraventricular tachycardia requiring hospitalization in 2007 as was felt to be possibly AV nodal reentrant rhythm.  In addition, the patient has a history of complex sleep apnea initially diagnosed in 2007 with an AHI at that time of 56.7 per hour and during REM sleep this increased to 65.7 per hour. At that time he dropped his oxygen 84% and had loud snoring. He had been on BiPAP therapy. He underwent a 5 year followup split-night study in 2012 which again confirmed severe sleep apnea with an AHI of 60.7 on the baseline portion of the study. He was unable to achieve REM sleep at that time. Oxygen saturation during non-REM sleep at 83%. He was titrated utilizing BiPAP up to a setting of 16/12 cm water pressure. He does admit to continued residual daytime sleepiness despite using his BiPAP therapy. Patient also notes  leg swelling. He also has had significant gain in weight and admits to a 50 pound weight gain since he last saw Dr. Rollene Fare almost 2 years ago. He has noticed significant blood pressure lability with blood pressures as high as 180/120. He had been recently taken off losartan as well as atenolol and has been treated with a systolic 10 mg as well as hydralazine 50 mg 2 times daily. His hyperlipidemia has been treated with Crestor 20 mg as well as setting up. He also has hypothyroidism on Synthroid replacement.   An echo Doppler study in August 2012 showed mild concentric LVH with normal systolic function and grade 1 diastolic dysfunction. He had trace mitral and tricuspid insufficiency as well as pulmonic insufficiency. A nuclear perfusion study in August 2012 showed normal perfusion.  [name], [name], [name].  He is on the [facility] transplant list.  In January 2017.  He had an stress echo at [facility] as part of his transplant evaluation.  I do not have the results of this evaluation.  He has had some difficulty with a steal syndrome in his right hand since he had AV fistulas inserted in the forearm and most recently now in the upper arm.  He continues to use BiPAP with 100% compliance.  He has a [employer] unit.  He obtains his CPAP supplies online because they are cheaper, but had seen [facility] patient.  He is using a Art therapist  full face mask.  I was able to obtain a download in the office from 05/28/2016 through 07/26/2016.  He is meeting compliance standards with usage at 85% usage stays greater than 4 hours at 77%.  He is averaging 6 hours and 43 minutes per night.  He is set at an IPAP pressure of 16 and EPAP pressure of 12.  His AHI is mildly increased at 6.5 with 5.2 per hour of apneic events and 1.3 of hypopnea events.  Since I last saw him, he had stopped taken Bystolic since his blood pressure had become low at the end of dialysis.   He had been on 5 mg on nondialysis days.  He tells me his aspirin was discontinued because he was continuously having issues with bleeding following his dialysis when the catheter was removed.  He is unaware of palpitations.  He presents for evaluation.   Past Medical History:  Diagnosis Date  . Anemia    low hgb at present  . Arthritis    FEET  . Blood transfusion without reported diagnosis 1974   kidney removed - L  . Cancer ([place])    skin ca right shoulder, plastic dsyplasia, pre-Ca polpys removed on Colonoscopy- 07/2014  . Colon polyps 07/23/2014   Tubular adenomas x 5  . Constipation   . Diabetes mellitus without complication (HCC)    Type 2  . Difficult intubation    unsure of actual problem but it was during the January 28, 2015 procedure.  Marland Kitchen Dysrhythmia   . Eczema   . ESRD (end stage renal disease) ([place])    hEMO mwf  . Headache(784.0)    slight dull h/a due kidney failure  . Hyperlipidemia   . Hypertension    SVT h/o , followed by Dr. Claiborne Billings  . Hypothyroidism   . Macular degeneration   . Neuropathy (HCC)    right hand and both feet  . PAT (paroxysmal atrial tachycardia) ([place])   . PSVT (paroxysmal supraventricular tachycardia) ([place])   . SBO (small bowel obstruction)   . Shortness of breath    with exertion  . Sleep apnea 06/18/11   split-night sleep study- [facility]., BiPAP- 13-16  . Thyroid disease   . Umbilical hernia    will repair with thi surgery    Past Surgical History:  Procedure Laterality Date  . AV FISTULA PLACEMENT Right 02/25/2015   Procedure: RIGHT ARTERIOVENOUS (AV) FISTULA CREATION;  Surgeon: Serafina Mitchell, MD;  Location: [facility];  Service: Vascular;  Laterality: Right;  . AV FISTULA PLACEMENT Right 09/15/2015   Procedure: ARTERIOVENOUS (AV) FISTULA CREATION- RIGHT ARM;  Surgeon: Serafina Mitchell, MD;  Location: [facility];  Service: Vascular;  Laterality: Right;  . CAPD INSERTION N/A 05/18/2014   Procedure: LAPAROSCOPIC INSERTION  CONTINUOUS AMBULATORY PERITONEAL DIALYSIS  (CAPD) CATHETER;  Surgeon: Ralene Ok, MD;  Location: [facility];  Service: General;  Laterality: N/A;  . COLONOSCOPY    . COLONOSCOPY WITH PROPOFOL N/A 07/12/2016   Procedure: COLONOSCOPY WITH PROPOFOL;  Surgeon: [name], MD;  Location: [facility];  Service: Endoscopy;  Laterality: N/A;  . declotting of fistula  08/2015  . ESOPHAGOGASTRODUODENOSCOPY (EGD) WITH PROPOFOL N/A 07/12/2016   Procedure: ESOPHAGOGASTRODUODENOSCOPY (EGD) WITH PROPOFOL;  Surgeon: [name], MD;  [facility]: [facility];  Service: Endoscopy;  Laterality: N/A;  . FISTULA SUPERFICIALIZATION Right 02/07/2016   Procedure: RIIGHT UPPER ARM FISTULA SUPERFICIALIZATION;  Surgeon: Elam Dutch, MD;  Location: [facility];  Service: Vascular;  Laterality: Right;  . IJ catheter insertion    . INSERTION OF DIALYSIS CATHETER Right 02/25/2015   Procedure: INSERTION OF RIGHT INTERNAL JUGULAR DIALYSIS CATHETER;  Surgeon: Serafina Mitchell, MD;  Location: [facility];  Service: Vascular;  Laterality: Right;  . LAPAROSCOPIC REPOSITIONING CAPD CATHETER N/A 06/16/2014   Procedure: LAPAROSCOPIC REPOSITIONING CAPD CATHETER;  Surgeon: Ralene Ok, MD;  Location: [place];  Service: General;  Laterality: N/A;  . LAPAROSCOPY N/A 05/04/2015   Procedure: LAPAROSCOPY DIAGNOSTIC LYSIS OF ADHESIONS;  Surgeon: Coralie Keens, MD;  Location: [place];  Service: General;  Laterality: N/A;  . MINOR REMOVAL OF PERITONEAL DIALYSIS CATHETER N/A 04/28/2015   Procedure:  REMOVAL OF PERITONEAL DIALYSIS CATHETER;  Surgeon: Coralie Keens, MD;  Location: [place];  Service: General;  Laterality: N/A;  . NEPHRECTOMY Left 1974  . RENAL BIOPSY Right 2012  . REVISON OF ARTERIOVENOUS FISTULA Right 05/26/2015   Procedure: SUPERFICIALIZATION OF ARTERIOVENOUS FISTULA WITH SIDE BRANCH LIGATIONS;  Surgeon: Serafina Mitchell, MD;  Location: [place];  Service: Vascular;  Laterality: Right;  . SKIN CANCER EXCISION     right shoulder  .  UMBILICAL HERNIA REPAIR N/A 05/18/2014   Procedure: HERNIA REPAIR UMBILICAL ADULT;  Surgeon: Ralene Ok, MD;  Location: [place];  Service: General;  Laterality: N/A;  . UMBILICAL HERNIA REPAIR N/A 04/28/2015   Procedure: UMBILICAL HERNIA REPAIR WITH MESH;  Surgeon: Coralie Keens, MD;  Location: [place] [facility];  Service: General;  Laterality: N/A;   except left nephrectomy in 1974.  No Known Allergies  Current Outpatient Prescriptions  Medication Sig Dispense Refill  . BELSOMRA 20 MG TABS     . calcitRIOL (ROCALTROL) 0.25 MCG capsule Take 0.75 mcg by mouth 3 (three) times a week. DURING DIALYSIS    . calcium acetate (PHOSLO) 667 MG capsule Take 667 mg by mouth 3 (three) times daily with meals.    . cetirizine (ZYRTEC) 10 MG tablet Take 10 mg by mouth every morning.     . docusate sodium (COLACE) 100 MG capsule Take 100 mg by mouth daily.     . DULoxetine (CYMBALTA) 30 MG capsule     . ethyl chloride spray USE PRE DIALYSIS AS NEEDED  10  . fluocinonide cream (LIDEX) 6.19 % Apply 1 application topically daily as needed (Skin inflammation (hand, thigh, back)).     [name] 1000 MG chewable tablet Chew 1,000-2,000 mg by mouth 4 (four) times daily. 2000 mg with every meal and 1000 mg with snack    . gabapentin (NEURONTIN) 100 MG capsule     . glucose blood (ONETOUCH VERIO) test strip Use as instructed to check blood sugar 6 a day dx code E11.29 550 each 3  . HUMULIN R 500 UNIT/ML injection INJECT 12 UNITS IN THE AM AND 18 UNITS AT SUPPER  1  . Insulin Pen Needle 31G X 5 MM MISC Use 4 per day to inject insulin. 400 each 1  . Insulin Syringe-Needle U-100 (INSULIN SYRINGE 1CC/31GX5/16") 31G X 5/16" 1 ML MISC Use to inject insulin 100 each 3  . levothyroxine (SYNTHROID, LEVOTHROID) 100 MCG tablet TAKE 1 TABLET (100 MCG TOTAL) BY MOUTH DAILY. 90 tablet 1  . lidocaine-prilocaine (EMLA) cream APPLY SMALL AMOUNT TO DIALYSIS SITE 30 -60 MIUNTES PRIOR TO DIALYSIS 3X A WEEK  4  . methocarbamol (ROBAXIN) 500 MG  tablet Take 500 mg by mouth daily.    . polyethylene glycol (MIRALAX / GLYCOLAX) packet Take 17 g by mouth daily.    Marland Kitchen  rosuvastatin (CRESTOR) 10 MG tablet Take 1 tablet (10 mg total) by mouth daily. (Patient taking differently: Take 20 mg by mouth daily. ) 90 tablet 1  . senna (SENOKOT) 8.6 MG tablet Take 1 tablet by mouth daily.    . SENSIPAR 60 MG tablet Take 60 mg by mouth daily.     . traMADol (ULTRAM) 50 MG tablet Take 1 tablet (50 mg total) by mouth every 6 (six) hours as needed. 15 tablet 0  . TRESIBA FLEXTOUCH 100 UNIT/ML SOPN FlexTouch Pen INJECT 30 UNITS INTO THE SKIN EVERY MORNING 45 mL 1  . zolpidem (AMBIEN) 10 MG tablet Take 10 mg by mouth at bedtime as needed for sleep.    . nebivolol (BYSTOLIC) 2.5 MG tablet Take 1 tablet on NON-DIALYSIS DAYS. 30 tablet 6   No current facility-administered medications for this visit.    Social history is notable in that he is married for 29 years. His 3 children. He has a degree as a chemistry. 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 He does drink occasional alcohol. He has smoked cigars.  ROS General: Negative; No fevers, chills, or night sweats; positive for morbid obesity HEENT: Negative; No changes in vision or hearing, sinus congestion, difficulty swallowing Pulmonary: Negative; No cough, wheezing, shortness of breath, hemoptysis Cardiovascular: Negative; No chest pain, presyncope, syncope, palpitations GI: Negative; No nausea, vomiting, diarrhea, or abdominal pain GU: Negative; No dysuria, hematuria, or difficulty voiding Musculoskeletal: Negative; no myalgias, joint pain, or weakness Hematologic/Oncology: Negative; no easy bruising, bleeding Endocrine: Positive for diabetes and hypothyroidism Neuro: Negative; no changes in balance, headaches Skin: Negative; No rashes or skin lesions Psychiatric: Negative; No behavioral problems, depression Sleep: Positive for OSA on BiPAP; no daytime sleepiness, hypersomnolence, bruxism, restless  legs, hypnogognic hallucinations, no cataplexy Other comprehensive 14 point system review is negative.  PE BP 136/82   Pulse (!) 101   Ht 6\' 1"  (1.854 m)   Wt 297 lb 9.6 oz (135 kg)   BMI 39.26 kg/m    Repeat blood pressure by me 120/78  Wt Readings from Last 3 Encounters:  07/26/16 297 lb 9.6 oz (135 kg)  07/12/16 294 lb 5 oz (133.5 kg)  06/20/16 (!) 301 lb (136.5 kg)   General: Alert, oriented, no distress.;  Morbidly obese Skin: normal turgor, no rashes HEENT: Normocephalic, atraumatic. Pupils round and reactive; sclera anicteric; Fundi mild arteriolar narrowing without hemorrhages or exudates the Nose without nasal septal hypertrophy Mouth/Parynx benign; Mallinpatti scale 3 Neck: No JVD, no carotid bruits with normal carotid upstroke Lungs: clear to ausculatation and percussion; no wheezing or rales Heart: RRR, s1 s2 normal soft S4. 1/6 systolic murmur; no S3 gallop.  No rubs thrills or heaves.. Abdomen: soft, nontender; no hepatosplenomehaly, BS+; abdominal aorta nontender and not dilated by palpation. Pulses 2+; fistula in the right upper arm Extremities: 1-lower extremity edema; no clubbinbg cyanosis, Homan's sign negative  Neurologic: grossly nonfocal Psychologic: Normal affect and mood   ECG (independently read by me): Sinus tachycardia 101 bpm.  April 2017 ECG (independently read by me): Sinus tachycardia 100 bpm.  2.  TC interval 45 ms.  November 2014 ECG: Normal sinus 34 beats per minute. PR interval 138 ms. QT interval 448 ms.  LABS: BMP Latest Ref Rng & Units 07/12/2016 06/14/2016 02/07/2016  Glucose 65 - 99 mg/dL 04/08/2016) 818(H) 909(P)  BUN 6 - 20 mg/dL - - -  Creatinine 112(T - 1.24 mg/dL - - -  Sodium 6.24 - 469 mmol/L 138 -  139  Potassium 3.5 - 5.1 mmol/L 4.4 - 3.9  Chloride 101 - 111 mmol/L - - -  CO2 22 - 32 mmol/L - - -  Calcium 8.9 - 10.3 mg/dL - - -   Hepatic Function Latest Ref Rng & Units 03/15/2016 05/13/2015 05/10/2015  Total Protein 6.5 - 8.1 g/dL - -  8.4(H)  Albumin 3.5 - 5.0 g/dL - 3.6 4.3  AST 15 - 41 U/L - - 32  ALT 0 - 53 U/L 16 - 18  Alk Phosphatase 38 - 126 U/L - - 85  Total Bilirubin 0.3 - 1.2 mg/dL - - 0.6    CBC Latest Ref Rng & Units 07/12/2016 02/07/2016 09/15/2015  WBC 4.0 - 10.5 K/uL - - -  Hemoglobin 13.0 - 17.0 g/dL 13.3 9.9(L) 11.6(L)  Hematocrit 39.0 - 52.0 % 39.0 29.0(L) 34.0(L)  Platelets 150 - 400 K/uL - - -    Lab Results  Component Value Date   MCV 96.7 05/13/2015   MCV 99.0 05/11/2015   MCV 99.4 05/10/2015   Lab Results  Component Value Date   TSH 2.11 06/14/2016   Lab Results  Component Value Date   HGBA1C 5.9 06/14/2016   Lipid Panel     Component Value Date/Time   CHOL 147 06/14/2016 0903   TRIG 119.0 06/14/2016 0903   HDL 36.70 (L) 06/14/2016 0903   CHOLHDL 4 06/14/2016 0903   VLDL 23.8 06/14/2016 0903   LDLCALC 87 06/14/2016 0903    RADIOLOGY: No results found.    ASSESSMENT AND PLAN: Robert [name] is a 55 year old Caucasian male is a history of morbid obesity, hypertension, diabetes mellitus, hyperlipidemia, and end-stage kidney disease on hemodialysis.  He has severe complex sleep apnea and has been on BiPAP therapy.  He admits to 100% compliance.  I was able to obtain a download over the past 60 days as well as 90 days.  He is meeting Medicare compliance.  His AHI 6. 2/90 days and 6.5 over the past 60 days.  I have recommended slight increase in his IPAP pressure to 17, EPAP pressure to 13.  A follow-up download will be obtained in 4 weeks.  If still elevated, further titration will be done at that time.  Presently, he has sinus tachycardia at rest.  I have suggested a retrial of low-dose Bystolic at just 2.5 mg to again try to take on nondialysis days.  His BMI is 39.26, and weight reduction was recommended.  He has not been routinely exercising and I have suggested if possible, at least walking 5 days per week for 30 minutes at a time.  He is off aspirin therapy due to persistent bleeding  following dialysis.  When I last saw him  I  recommended initiation of Crestor with his LDL of 147.  His most recent blood work is significantly improved, but LDL is still elevated at 87.  I have recommended further titration of Crestor to 20 mg.  He denies cold or heat intolerance on his current dose of levothyroxine 100 g for hypothyroidism.  He is diabetic on insulin and has peripheral neuropathy.  I will see him in 6 months for cardiology reevaluation.  Time spent: 25 minutes Troy Sine, MD, [facility]  07/28/2016 12:02 PM

## 2016-08-04 IMAGING — CT CT ABD-PELV W/O CM
2 of 4 series · 17 of 46 positions shown, 19 images · non-contrast
Comparison: 06/12/2014

CLINICAL DATA: Lower abdominal pain since last night. Unable to
complete routine peritoneal dialysis at home. Diaphoretic.

EXAM:
CT ABDOMEN AND PELVIS WITHOUT CONTRAST
TECHNIQUE: Multidetector CT imaging of the abdomen and pelvis was performed
following the standard protocol without IV contrast.

[Series 2: abd/ pelvis 5.0 i30f 1 · axial · 0.97mm/px · z∈[+4,+434]mm · 14 of 96 slices shown, 16 images]
[im 5/96  soft-tissue]
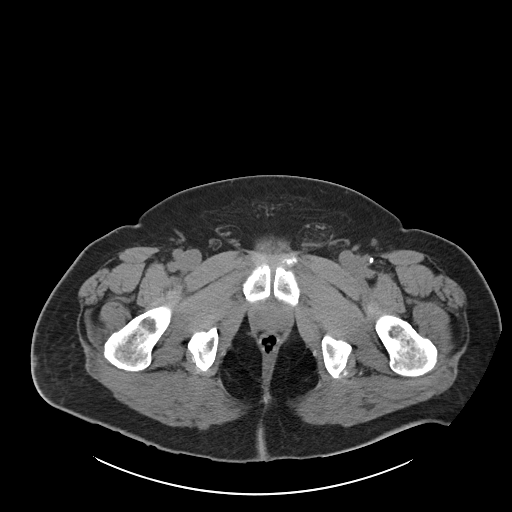
[im 5/96  bone]
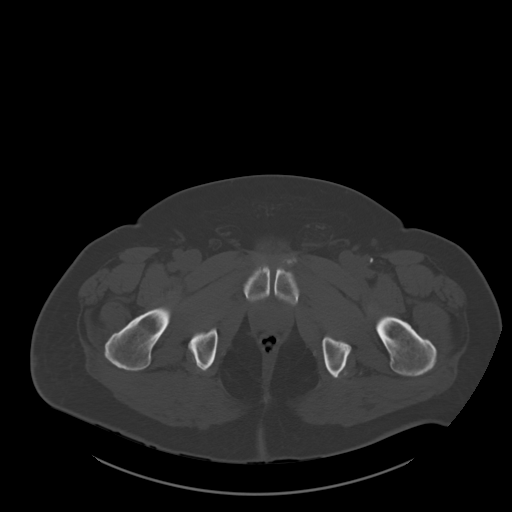
[im 13/96  soft-tissue]
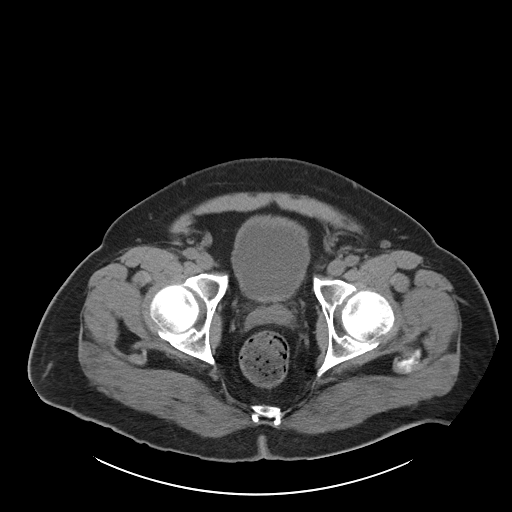
[im 17/96  soft-tissue]
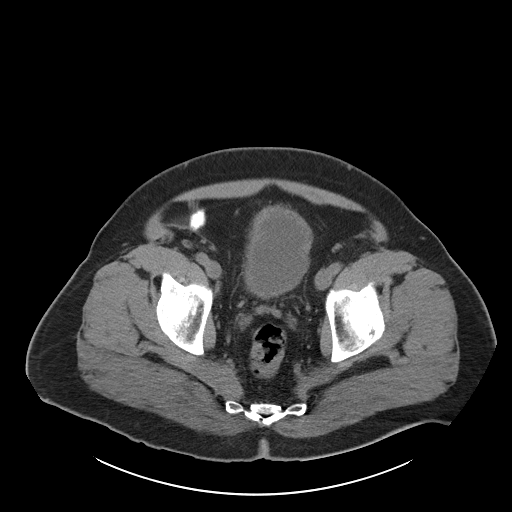
[im 25/96  soft-tissue]
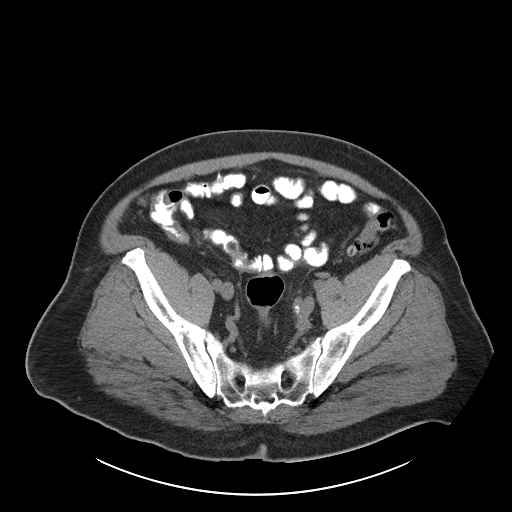
[im 34/96  soft-tissue]
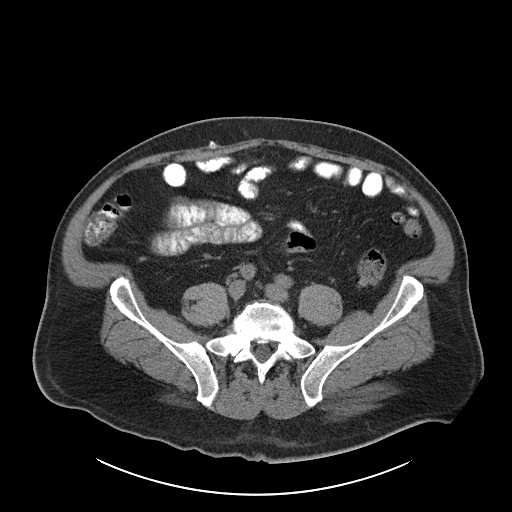
[im 38/96  soft-tissue]
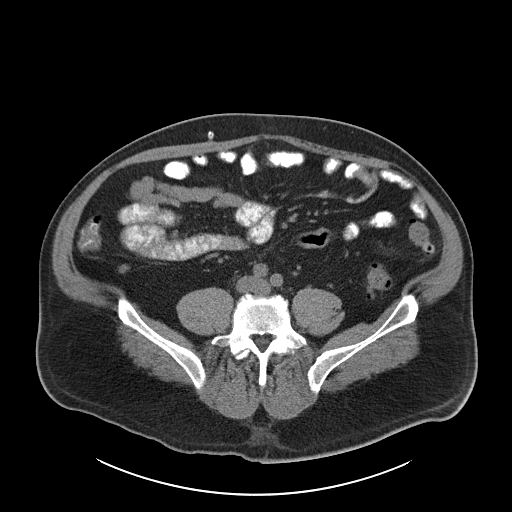
[im 46/96  soft-tissue]
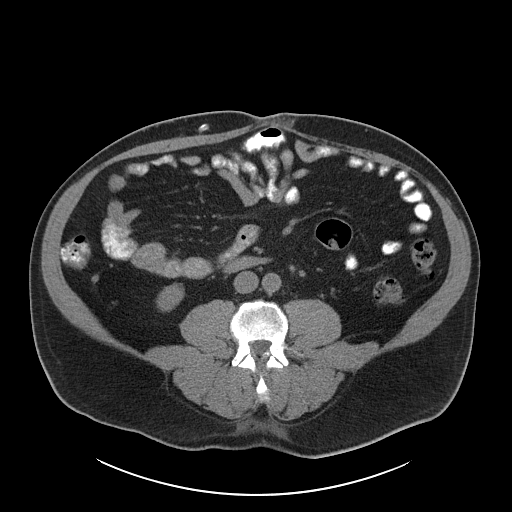
[im 50/96  soft-tissue]
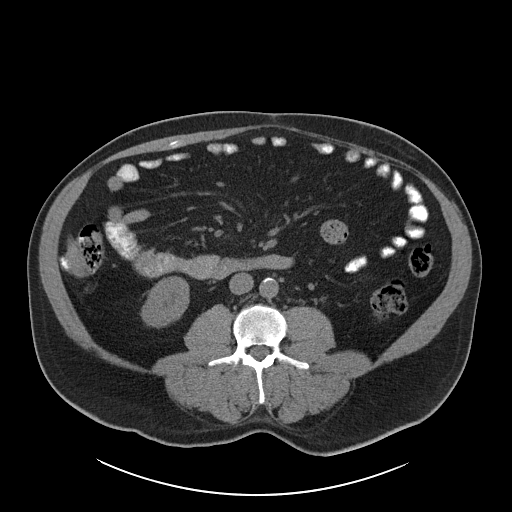
[im 58/96  soft-tissue]
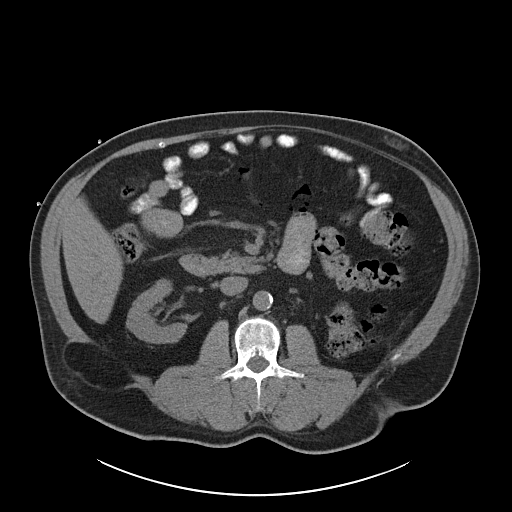
[im 58/96  bone]
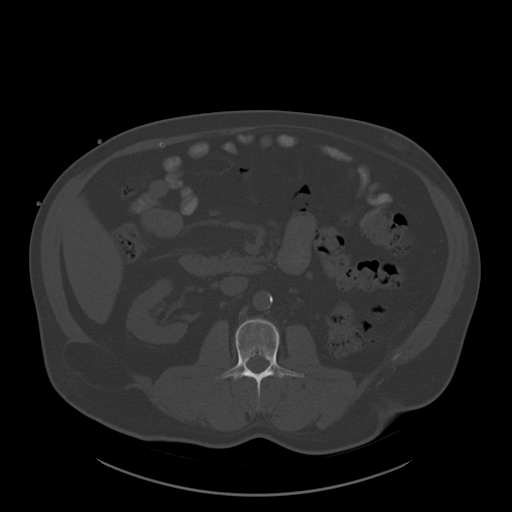
[im 62/96  soft-tissue]
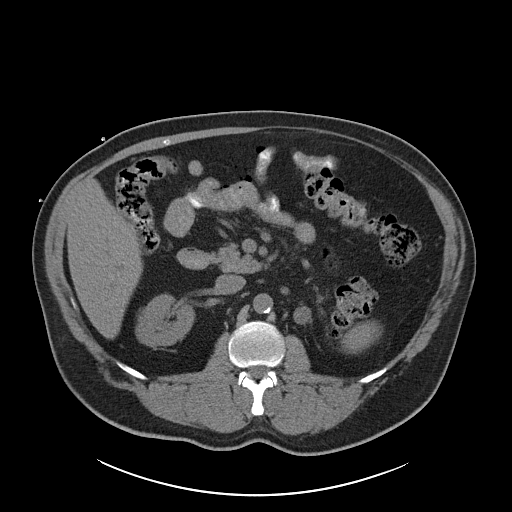
[im 71/96  soft-tissue]
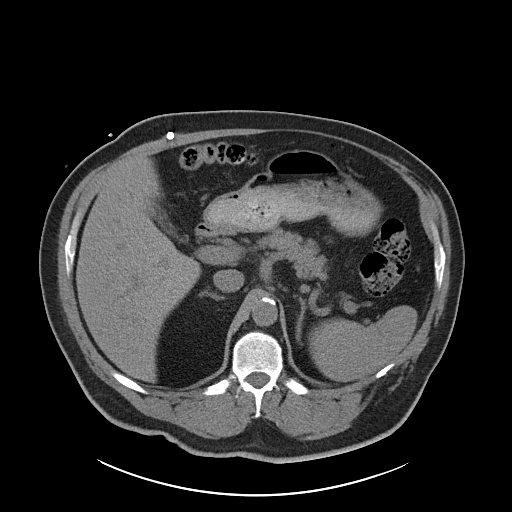
[im 79/96  soft-tissue]
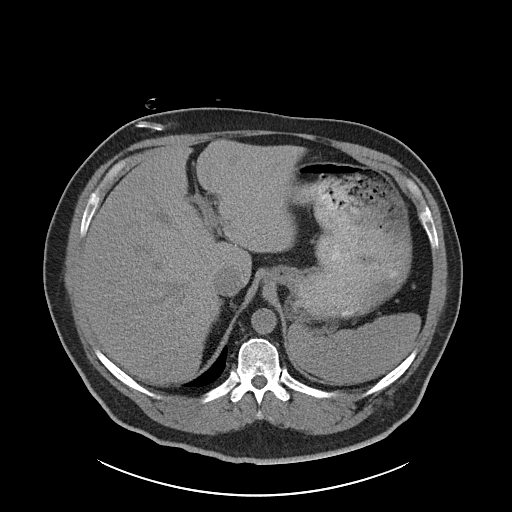
[im 83/96  soft-tissue]
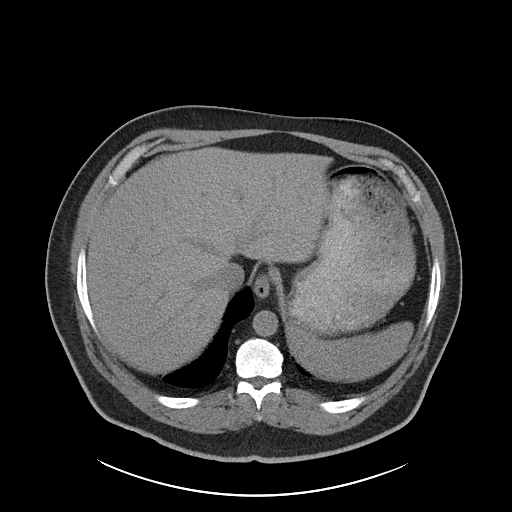
[im 91/96  soft-tissue]
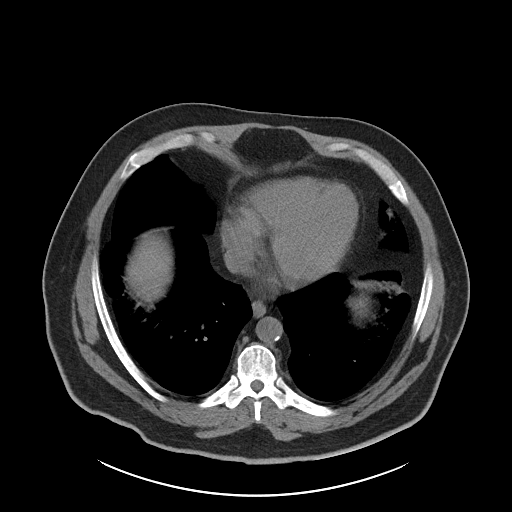

[Series 5: cor st · coronal · 0.91mm/px · 3 of 98 slices shown]
[im 33/98  soft-tissue]
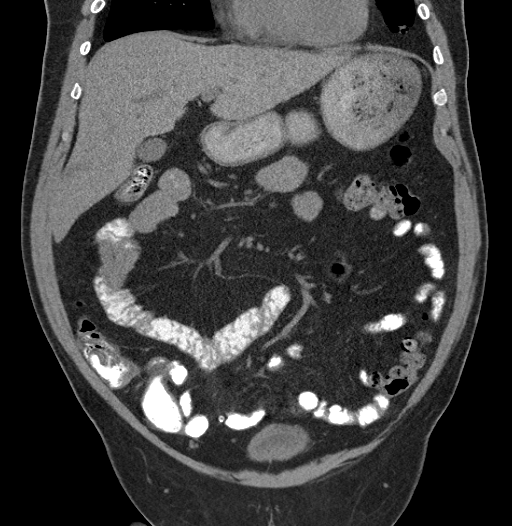
[im 44/98  soft-tissue]
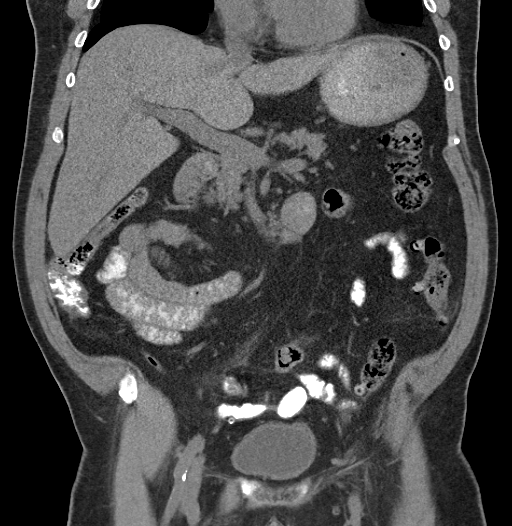
[im 54/98  soft-tissue]
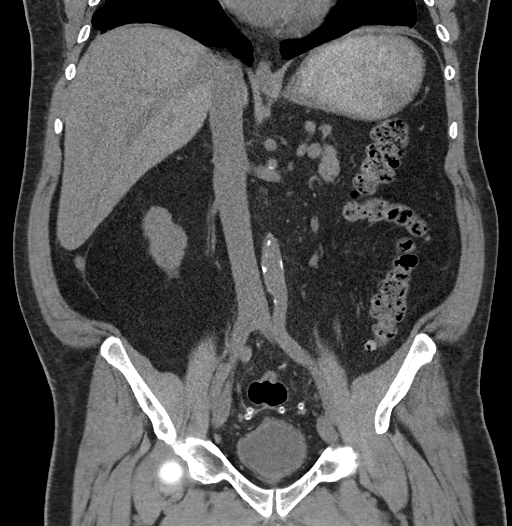

[17 of 46 positions shown; findings below may reference images not displayed]

FINDINGS: Bibasilar atelectasis.  No effusions.  Heart is normal size.

Liver, gallbladder, spleen, pancreas, adrenals and right kidney have
an unremarkable unenhanced appearance. Small 17 mm nodular area in
the left renal fossa is stable. Left Kidney not visualized.

There is descending colonic and sigmoid diverticulosis. Mild
inflammatory changes noted around the distal descending colon
compatible with active diverticulitis. Appendix is visualized and is
normal. Stomach and small bowel are unremarkable.

Peritoneal dialysis catheter is in place within the pelvis. No free
fluid, free air or adenopathy. Urinary bladder is grossly
unremarkable.

Small bilateral inguinal hernias and umbilical hernia containing
fat.
IMPRESSION: Left colonic diverticulosis. Mild inflammatory change around the
distal descending colon compatible with active diverticulitis.

## 2016-08-20 ENCOUNTER — Other Ambulatory Visit (INDEPENDENT_AMBULATORY_CARE_PROVIDER_SITE_OTHER): Payer: BLUE CROSS/BLUE SHIELD

## 2016-08-20 DIAGNOSIS — Z794 Long term (current) use of insulin: Secondary | ICD-10-CM

## 2016-08-20 DIAGNOSIS — E1165 Type 2 diabetes mellitus with hyperglycemia: Secondary | ICD-10-CM

## 2016-08-20 LAB — GLUCOSE, RANDOM: Glucose, Bld: 62 mg/dL — ABNORMAL LOW (ref 70–99)

## 2016-08-21 LAB — FRUCTOSAMINE: Fructosamine: 317 umol/L — ABNORMAL HIGH (ref 0–285)

## 2016-08-22 ENCOUNTER — Ambulatory Visit: Payer: Self-pay | Admitting: Gastroenterology

## 2016-08-23 ENCOUNTER — Encounter: Payer: Self-pay | Admitting: Endocrinology

## 2016-08-23 ENCOUNTER — Ambulatory Visit (INDEPENDENT_AMBULATORY_CARE_PROVIDER_SITE_OTHER): Payer: BLUE CROSS/BLUE SHIELD | Admitting: Endocrinology

## 2016-08-23 VITALS — BP 122/80 | HR 80 | Ht 73.0 in | Wt 298.0 lb

## 2016-08-23 DIAGNOSIS — E1165 Type 2 diabetes mellitus with hyperglycemia: Secondary | ICD-10-CM | POA: Diagnosis not present

## 2016-08-23 DIAGNOSIS — E038 Other specified hypothyroidism: Secondary | ICD-10-CM

## 2016-08-23 DIAGNOSIS — E063 Autoimmune thyroiditis: Secondary | ICD-10-CM

## 2016-08-23 DIAGNOSIS — E782 Mixed hyperlipidemia: Secondary | ICD-10-CM

## 2016-08-23 DIAGNOSIS — Z794 Long term (current) use of insulin: Secondary | ICD-10-CM

## 2016-08-23 NOTE — Patient Instructions (Signed)
Carb ratio 1:8 at meals  Check blood sugars on waking up  2-3x per week  Also check blood sugars about 2 hours after a meal and do this after different meals by rotation, more after dinner  Recommended blood sugar levels on waking up is 90-130 and about 2 hours after meal is 130-160  Please bring your blood sugar monitor to each visit, thank you

## 2016-08-23 NOTE — Progress Notes (Signed)
Patient ID: Robert [name], male   DOB: 12-11-1960, 55 y.o.   MRN: [id number]   Reason for Appointment: Type II Diabetes follow-up   History of Present Illness   Diagnosis date:  2012  Previous history: His blood sugar has been difficult to control since onset. Initially was taking oral hypoglycemic drugs like glyburide but could not take metformin because of renal dysfunction. Since Victoza did not help his control he was started on Lantus and then U 500 insulin also His A1c has previously ranged from 7.8-9.8, the lowest level in 03/2013 He was on Victoza but because of his markedly decreased appetite and weight loss this was stopped in 9/15  Recent history:   Insulin regimen: Tresiba 28 units In a.m.  U-500 insulin with syringe: Bfst 0-10 units, 5-10 units at lunch, 8-20 units acs  His A1c is usually lower than expected for his blood sugars and usually below 6 However fructosamine is still relatively high at 317, previously 305  Current blood sugar patterns, management and problems identified:  Fasting blood sugars have been checked only rarely with only 2 readings recently which are normal  On his last visit he was having relatively low fasting readings and Robert [name] was reduced by 2 units  He is primarily taking sugars AFTER his lunch probably because he wants to check it before going for dialysis in the afternoon  These readings are quite variable and he tends this is based on his diet  He is using very arbitrary doses of insulin for covering his lunch meals although he does try to get some protein with every meal  Currently not counting carbohydrates but is able to do this periodically  At suppertime he is eating smaller meals generally and on dialysis days will only have a protein bar  Has only one or 2 readings after supper  HYPOGLYCEMIA: Has had 2 instances after lunch and no other time, glucose low after lunch on the lab also  Taking Tresiba in the  morning  His trying to walk more regularly to try to lose weight   Proper timing of medications in relation to meals: Yes.          Monitors blood glucose: Once a day or less.    Glucometer: One Probation officer.          Blood Glucose readings by   Mean values apply above for all meters except median for One Touch  PRE-MEAL Fasting Lunch Dinner Bedtime Overall  Glucose range: 93-1 15   87, 145   58-234   Mean/median:     115    POST-MEAL PC Breakfast PC Lunch PC Dinner  Glucose range: 100-162  58-234  93, 172   Mean/median:  132      :  Meals: 2-3 meals per day. 8 am and Noon and Dinner 8-9 PM .   Is on the low phosphorus diet and has fewer carbohydrates, not eating cereals        Physical activity: exercise: walking, nearly everyday         Weight control:  Wt Readings from Last 3 Encounters:  08/23/16 298 lb (135.2 kg)  07/26/16 297 lb 9.6 oz (135 kg)  07/12/16 294 lb 5 oz (381.0 kg)        Complications:   nephropathy, neuropathy   Diabetes labs:   Lab Results  Component Value Date   HGBA1C 5.9 06/14/2016   HGBA1C 5.7 12/15/2015   HGBA1C 5.8 09/16/2015  Lab Results  Component Value Date   MICROALBUR 224.5 Repeated and verified X2. (H) 01/04/2014   LDLCALC 87 06/14/2016   CREATININE 10.76 (H) 05/13/2015     Lab on 08/20/2016  Component Date Value Ref Range Status  . Fructosamine 08/21/2016 317* 0 - 285 umol/L Final   Comment: Published reference interval for apparently healthy subjects between age 43 and 75 is 72 - 285 umol/L and in a poorly controlled diabetic population is 228 - 563 umol/L with a mean of 396 umol/L.   Marland Kitchen Glucose, Bld 08/20/2016 62* 70 - 99 mg/dL Final    OTHER problems: See review of systems      Medication List       Accurate as of 08/23/16  8:57 PM. Always use your most recent med list.          BELSOMRA 20 MG Tabs Generic drug:  Suvorexant   calcitRIOL 0.25 MCG capsule Commonly known as:  ROCALTROL Take 0.75 mcg by  mouth 3 (three) times a week. DURING DIALYSIS   calcium acetate 667 MG capsule Commonly known as:  PHOSLO Take 667 mg by mouth 3 (three) times daily with meals.   cetirizine 10 MG tablet Commonly known as:  ZYRTEC Take 10 mg by mouth every morning.   docusate sodium 100 MG capsule Commonly known as:  COLACE Take 100 mg by mouth daily.   DULoxetine 30 MG capsule Commonly known as:  CYMBALTA   ethyl chloride spray USE PRE DIALYSIS AS NEEDED   fluocinonide cream 0.05 % Commonly known as:  LIDEX Apply 1 application topically daily as needed (Skin inflammation (hand, thigh, back)).   FOSRENOL 1000 MG chewable tablet Generic drug:  lanthanum Chew 1,000-2,000 mg by mouth 4 (four) times daily. 2000 mg with every meal and 1000 mg with snack   gabapentin 100 MG capsule Commonly known as:  NEURONTIN 200 mg daily.   glucose blood test strip Commonly known as:  ONETOUCH VERIO Use as instructed to check blood sugar 6 a day dx code E11.29   HUMULIN R 500 UNIT/ML injection Generic drug:  insulin regular human CONCENTRATED INJECT 12 UNITS IN THE AM AND 18 UNITS AT SUPPER   Insulin Pen Needle 31G X 5 MM Misc Use 4 per day to inject insulin.   INSULIN SYRINGE 1CC/31GX5/16" 31G X 5/16" 1 ML Misc Use to inject insulin   levothyroxine 100 MCG tablet Commonly known as:  SYNTHROID, LEVOTHROID TAKE 1 TABLET (100 MCG TOTAL) BY MOUTH DAILY.   lidocaine-prilocaine cream Commonly known as:  EMLA APPLY SMALL AMOUNT TO DIALYSIS SITE 30 -60 MIUNTES PRIOR TO DIALYSIS 3X A WEEK   methocarbamol 500 MG tablet Commonly known as:  ROBAXIN Take 500 mg by mouth daily.   nebivolol 2.5 MG tablet Commonly known as:  BYSTOLIC Take 1 tablet on NON-DIALYSIS DAYS.   polyethylene glycol packet Commonly known as:  MIRALAX / GLYCOLAX Take 17 g by mouth daily.   rosuvastatin 10 MG tablet Commonly known as:  CRESTOR Take 1 tablet (10 mg total) by mouth daily.   senna 8.6 MG tablet Commonly known  as:  SENOKOT Take 1 tablet by mouth daily.   SENSIPAR 60 MG tablet Generic drug:  cinacalcet Take 60 mg by mouth daily.   traMADol 50 MG tablet Commonly known as:  ULTRAM Take 1 tablet (50 mg total) by mouth every 6 (six) hours as needed.   TRESIBA FLEXTOUCH 100 UNIT/ML Sopn FlexTouch Pen Generic drug:  insulin degludec INJECT 30 UNITS  INTO THE SKIN EVERY MORNING   zolpidem 10 MG tablet Commonly known as:  AMBIEN Take 10 mg by mouth at bedtime as needed for sleep.       Allergies: No Known Allergies  Past Medical History:  Diagnosis Date  . Anemia    low hgb at present  . Arthritis    FEET  . Blood transfusion without reported diagnosis 1974   kidney removed - L  . Cancer ([place])    skin ca right shoulder, plastic dsyplasia, pre-Ca polpys removed on Colonoscopy- 07/2014  . Colon polyps 07/23/2014   Tubular adenomas x 5  . Constipation   . Diabetes mellitus without complication (HCC)    Type 2  . Difficult intubation    unsure of actual problem but it was during the January 28, 2015 procedure.  Marland Kitchen Dysrhythmia   . Eczema   . ESRD (end stage renal disease) ([place])    hEMO mwf  . Headache(784.0)    slight dull h/a due kidney failure  . Hyperlipidemia   . Hypertension    SVT h/o , followed by Dr. Claiborne Billings  . Hypothyroidism   . Macular degeneration   . Neuropathy (HCC)    right hand and both feet  . PAT (paroxysmal atrial tachycardia) ([place])   . PSVT (paroxysmal supraventricular tachycardia) ([place])   . SBO (small bowel obstruction)   . Shortness of breath    with exertion  . Sleep apnea 06/18/11   split-night sleep study- [facility]., BiPAP- 13-16  . Thyroid disease   . Umbilical hernia    will repair with thi surgery    Past Surgical History:  Procedure Laterality Date  . AV FISTULA PLACEMENT Right 02/25/2015   Procedure: RIGHT ARTERIOVENOUS (AV) FISTULA CREATION;  Surgeon: Serafina Mitchell, MD;  Location: [place];  Service: Vascular;   Laterality: Right;  . AV FISTULA PLACEMENT Right 09/15/2015   Procedure: ARTERIOVENOUS (AV) FISTULA CREATION- RIGHT ARM;  Surgeon: Serafina Mitchell, MD;  Location: [place];  Service: Vascular;  Laterality: Right;  . CAPD INSERTION N/A 05/18/2014   Procedure: LAPAROSCOPIC INSERTION CONTINUOUS AMBULATORY PERITONEAL DIALYSIS  (CAPD) CATHETER;  Surgeon: Ralene Ok, MD;  Location: [place];  Service: General;  Laterality: N/A;  . COLONOSCOPY    . COLONOSCOPY WITH PROPOFOL N/A 07/12/2016   Procedure: COLONOSCOPY WITH PROPOFOL;  Surgeon: [name], MD;  [facility];  Service: Endoscopy;  Laterality: N/A;  . declotting of fistula  08/2015  . ESOPHAGOGASTRODUODENOSCOPY (EGD) WITH PROPOFOL N/A 07/12/2016   Procedure: ESOPHAGOGASTRODUODENOSCOPY (EGD) WITH PROPOFOL;  Surgeon: [name], MD;  [facility];  Service: Endoscopy;  Laterality: N/A;  . FISTULA SUPERFICIALIZATION Right 02/07/2016   Procedure: RIIGHT UPPER ARM FISTULA SUPERFICIALIZATION;  Surgeon: Elam Dutch, MD;  Location: [place];  Service: Vascular;  Laterality: Right;  . IJ catheter insertion    . INSERTION OF DIALYSIS CATHETER Right 02/25/2015   Procedure: INSERTION OF RIGHT INTERNAL JUGULAR DIALYSIS CATHETER;  Surgeon: Serafina Mitchell, MD;  Location: [place];  Service: Vascular;  Laterality: Right;  . LAPAROSCOPIC REPOSITIONING CAPD CATHETER N/A 06/16/2014   Procedure: LAPAROSCOPIC REPOSITIONING CAPD CATHETER;  Surgeon: Ralene Ok, MD;  Location: [place];  Service: General;  Laterality: N/A;  . LAPAROSCOPY N/A 05/04/2015   Procedure: LAPAROSCOPY DIAGNOSTIC LYSIS OF ADHESIONS;  Surgeon: Coralie Keens, MD;  Location: [place];  Service: General;  Laterality: N/A;  . MINOR REMOVAL OF PERITONEAL DIALYSIS CATHETER N/A 04/28/2015   Procedure:  REMOVAL OF PERITONEAL DIALYSIS CATHETER;  Surgeon: Coralie Keens, MD;  Location: [place];  Service: General;  Laterality: N/A;  . NEPHRECTOMY Left 1974  . RENAL BIOPSY Right 2012   . REVISON OF ARTERIOVENOUS FISTULA Right 05/26/2015   Procedure: SUPERFICIALIZATION OF ARTERIOVENOUS FISTULA WITH SIDE BRANCH LIGATIONS;  Surgeon: Serafina Mitchell, MD;  Location: [place];  Service: Vascular;  Laterality: Right;  . SKIN CANCER EXCISION     right shoulder  . UMBILICAL HERNIA REPAIR N/A 05/18/2014   Procedure: HERNIA REPAIR UMBILICAL ADULT;  Surgeon: Ralene Ok, MD;  Location: [place];  Service: General;  Laterality: N/A;  . UMBILICAL HERNIA REPAIR N/A 04/28/2015   Procedure: UMBILICAL HERNIA REPAIR WITH MESH;  Surgeon: Coralie Keens, MD;  [facility];  Service: General;  Laterality: N/A;    Family History  Problem Relation Age of Onset  . Colon polyps Father   . Colon cancer Neg Hx   . Stomach cancer Neg Hx     Social History:  reports that he quit smoking about 3 years ago. His smoking use included Cigars. He has never used smokeless tobacco. He reports that he drinks about 0.6 oz of alcohol per week . He reports that he does not use drugs.  Review of Systems:  Hypertension:  BP is variably controlled and he tends to have low readings during dialysis Periodically  His blood pressure has been managed by his nephrologist   BP Readings from Last 3 Encounters:  08/23/16 122/80  07/26/16 136/82  07/12/16 (!) 94/48     Lipids:  His LDL is below 100 with 10 mg Crestor HDL is persistently low Has no evidence of vascular disease    Lab Results  Component Value Date   CHOL 147 06/14/2016   CHOL 161 03/15/2016   CHOL 219 (H) 12/15/2015   Lab Results  Component Value Date   HDL 36.70 (L) 06/14/2016   HDL 37.70 (L) 03/15/2016   HDL 44.40 12/15/2015   Lab Results  Component Value Date   LDLCALC 87 06/14/2016   LDLCALC 96 03/15/2016   LDLCALC 147 (H) 12/15/2015   Lab Results  Component Value Date   TRIG 119.0 06/14/2016   TRIG 135.0 03/15/2016   TRIG 138.0 12/15/2015   Lab Results  Component Value Date   CHOLHDL 4 06/14/2016   CHOLHDL 4  03/15/2016   CHOLHDL 5 12/15/2015   No results found for: LDLDIRECT   HYPOTHYROIDISM: He has had long-standing hypothyroidism   TSH is normal On the last check.  Has been taking his 100 g levothyroxine before breakfast consistently   Lab Results  Component Value Date   TSH 2.11 06/14/2016    He is on waiting list for  renal transplant and is going to [facility] for this    HYPOGONADISM: 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 metabolic syndrome and had subjectively improved with supplementation using Fortesta  He has been off testosterone for Over a year and free testosterone was normal recently  Lab Results  Component Value Date   TESTOSTERONE 369 06/14/2016    Diabetic neuropathy: Has mild sensory loss on his last exam   Examination:   BP 122/80   Pulse 80   Ht 6\' 1"  (1.854 m)   Wt 298 lb (135.2 kg)   SpO2 95%   BMI 39.32 kg/m   Body mass index is 39.32 kg/m.    ASSESSMENT/ PLAN:    Diabetes type 2 with obesity:  See history of  present illness for detailed discussion of his current management, problems identified and sugar patterns lately  His blood sugars appear to be Generally fairly good However fructosamine still indicates overall high readings He is not checking blood sugars consistently and mostly after lunch Diet can be variable in carbohydrate intake  He does need to try and use carbohydrate counting to cover his mealtime insulin requirement as this would be more accurate than arbitrarily taking anywhere from 5-18 units For now can divide the grams of carbohydrates by 8 and see if this will help get his postprandial readings more in line  Also discussed that with higher carbohydrate meals he may not get an early peak of his insulin and would need to take it 30 min before eating preferably as well as have protein with every meal Also recommended more fasting blood sugars to help adjust Tresiba Needs to avoid hypoglycemia which  will prevent weight loss Also discussed blood sugar targets after meals to be about 150-170 including after supper when he is not monitoring much   Patient Instructions  Carb ratio 1:8 at meals  Check blood sugars on waking up  2-3x per week  Also check blood sugars about 2 hours after a meal and do this after different meals by rotation, more after dinner  Recommended blood sugar levels on waking up is 90-130 and about 2 hours after meal is 130-160  Please bring your blood sugar monitor to each visit, thank you   Counseling time on subjects discussed above is over 50% of today's 25 minute visit   Shanoah Asbill 08/23/2016, 8:57 PM

## 2016-09-04 ENCOUNTER — Encounter: Payer: Self-pay | Admitting: Gastroenterology

## 2016-09-04 ENCOUNTER — Ambulatory Visit (INDEPENDENT_AMBULATORY_CARE_PROVIDER_SITE_OTHER): Payer: BLUE CROSS/BLUE SHIELD | Admitting: Gastroenterology

## 2016-09-04 VITALS — BP 130/80 | HR 80 | Ht 72.0 in | Wt 298.8 lb

## 2016-09-04 DIAGNOSIS — R195 Other fecal abnormalities: Secondary | ICD-10-CM

## 2016-09-04 DIAGNOSIS — Z86010 Personal history of colonic polyps: Secondary | ICD-10-CM | POA: Diagnosis not present

## 2016-09-04 NOTE — Progress Notes (Signed)
Review of pertinent gastrointestinal problems: 1. Adenomatous colon polyps: colonoscopy was done in October 2015 for screening- he had 3 semi-pedunculated polyps measuring between 5 and 9 mm all of which were removed. Also had mild diverticulosis,  no hemorrhoids. He was planned  for 3 year interval follow-up for path showing  tubular adenomas.  He had interval hemosure testing by PCP (overscreening) which was positive 05/2016 and so underwent colonoscopy/EGD 07/2016 Dr. Ardis Hughs; colonoscopy found diverticulosis, a 58mm adenoma (recommended recall at 5 years). EGD was normal.  HPI: This is a   very pleasant 55 year old man whom I last saw the time of endoscopic procedures one month ago.  He recovered well from that. He has seen no overt GI bleeding.  Chief complaint is Hemoccult-positive stool, small precancerous polyp in his colon  ROS: complete GI ROS as described in HPI.  Constitutional:  No unintentional weight loss   Past Medical History:  Diagnosis Date  . Anemia    low hgb at present  . Arthritis    FEET  . Blood transfusion without reported diagnosis 1974   kidney removed - L  . Cancer ([place])    skin ca right shoulder, plastic dsyplasia, pre-Ca polpys removed on Colonoscopy- 07/2014  . Colon polyps 07/23/2014   Tubular adenomas x 5  . Constipation   . Diabetes mellitus without complication (HCC)    Type 2  . Difficult intubation    unsure of actual problem but it was during the January 28, 2015 procedure.  Marland Kitchen Dysrhythmia   . Eczema   . ESRD (end stage renal disease) ([place])    hEMO mwf  . Headache(784.0)    slight dull h/a due kidney failure  . Hyperlipidemia   . Hypertension    SVT h/o , followed by Dr. Claiborne Billings  . Hypothyroidism   . Macular degeneration   . Neuropathy (HCC)    right hand and both feet  . PAT (paroxysmal atrial tachycardia) ([place])   . PSVT (paroxysmal supraventricular tachycardia) ([place])   . SBO (small bowel obstruction)   . Shortness of breath    with  exertion  . Sleep apnea 06/18/11   [facility] BiPAP- 13-16  . Thyroid disease   . Umbilical hernia    will repair with thi surgery    Past Surgical History:  Procedure Laterality Date  . AV FISTULA PLACEMENT Right 02/25/2015   Procedure: RIGHT ARTERIOVENOUS (AV) FISTULA CREATION;  Surgeon: Serafina Mitchell, MD;  Location: [place];  Service: Vascular;  Laterality: Right;  . AV FISTULA PLACEMENT Right 09/15/2015   Procedure: ARTERIOVENOUS (AV) FISTULA CREATION- RIGHT ARM;  Surgeon: Serafina Mitchell, MD;  Location: [place];  Service: Vascular;  Laterality: Right;  . CAPD INSERTION N/A 05/18/2014   Procedure: LAPAROSCOPIC INSERTION CONTINUOUS AMBULATORY PERITONEAL DIALYSIS  (CAPD) CATHETER;  Surgeon: Ralene Ok, MD;  Location: [place];  Service: General;  Laterality: N/A;  . COLONOSCOPY    . COLONOSCOPY WITH PROPOFOL N/A 07/12/2016   Procedure: COLONOSCOPY WITH PROPOFOL;  Surgeon: [name], MD;  [facility];  Service: Endoscopy;  Laterality: N/A;  . declotting of fistula  08/2015  . ESOPHAGOGASTRODUODENOSCOPY (EGD) WITH PROPOFOL N/A 07/12/2016   Procedure: ESOPHAGOGASTRODUODENOSCOPY (EGD) WITH PROPOFOL;  Surgeon: [name], MD;  [facility];  Service: Endoscopy;  Laterality: N/A;  . FISTULA SUPERFICIALIZATION Right 02/07/2016   Procedure: RIIGHT UPPER ARM FISTULA SUPERFICIALIZATION;  Surgeon: Elam Dutch, MD;  Location: [place];  Service: Vascular;  Laterality: Right;  . IJ catheter insertion    . INSERTION OF DIALYSIS CATHETER Right 02/25/2015   Procedure: INSERTION OF RIGHT INTERNAL JUGULAR DIALYSIS CATHETER;  Surgeon: Serafina Mitchell, MD;  Location: [place];  Service: Vascular;  Laterality: Right;  . LAPAROSCOPIC REPOSITIONING CAPD CATHETER N/A 06/16/2014   Procedure: LAPAROSCOPIC REPOSITIONING CAPD CATHETER;  Surgeon: Ralene Ok, MD;  Location: [place];  Service: General;  Laterality: N/A;  . LAPAROSCOPY N/A  05/04/2015   Procedure: LAPAROSCOPY DIAGNOSTIC LYSIS OF ADHESIONS;  Surgeon: Coralie Keens, MD;  Location: [place];  Service: General;  Laterality: N/A;  . MINOR REMOVAL OF PERITONEAL DIALYSIS CATHETER N/A 04/28/2015   Procedure:  REMOVAL OF PERITONEAL DIALYSIS CATHETER;  Surgeon: Coralie Keens, MD;  Location: [place];  Service: General;  Laterality: N/A;  . NEPHRECTOMY Left 1974  . RENAL BIOPSY Right 2012  . REVISON OF ARTERIOVENOUS FISTULA Right 05/26/2015   Procedure: SUPERFICIALIZATION OF ARTERIOVENOUS FISTULA WITH SIDE BRANCH LIGATIONS;  Surgeon: Serafina Mitchell, MD;  Location: [place];  Service: Vascular;  Laterality: Right;  . SKIN CANCER EXCISION     right shoulder  . UMBILICAL HERNIA REPAIR N/A 05/18/2014   Procedure: HERNIA REPAIR UMBILICAL ADULT;  Surgeon: Ralene Ok, MD;  Location: [place];  Service: General;  Laterality: N/A;  . UMBILICAL HERNIA REPAIR N/A 04/28/2015   Procedure: UMBILICAL HERNIA REPAIR WITH MESH;  Surgeon: Coralie Keens, MD;  Location: [place];  Service: General;  Laterality: N/A;    Current Outpatient Prescriptions  Medication Sig Dispense Refill  . calcitRIOL (ROCALTROL) 0.25 MCG capsule Take 0.75 mcg by mouth 3 (three) times a week. DURING DIALYSIS    . calcium acetate (PHOSLO) 667 MG capsule Take 667 mg by mouth 3 (three) times daily with meals.    . cetirizine (ZYRTEC) 10 MG tablet Take 10 mg by mouth every morning.     . docusate sodium (COLACE) 100 MG capsule Take 100 mg by mouth daily.     . DULoxetine (CYMBALTA) 30 MG capsule     . ethyl chloride spray USE PRE DIALYSIS AS NEEDED  10  . fluocinonide cream (LIDEX) 9.16 % Apply 1 application topically daily as needed (Skin inflammation (hand, thigh, back)).     [name] 1000 MG chewable tablet Chew 1,000-2,000 mg by mouth 4 (four) times daily. 2000 mg with every meal and 1000 mg with snack    . gabapentin (NEURONTIN) 100 MG capsule 200 mg daily.     Marland Kitchen glucose blood (ONETOUCH VERIO) test strip Use as  instructed to check blood sugar 6 a day dx code E11.29 550 each 3  . HUMULIN R 500 UNIT/ML injection INJECT 12 UNITS IN THE AM AND 18 UNITS AT SUPPER  1  . Insulin Pen Needle 31G X 5 MM MISC Use 4 per day to inject insulin. 400 each 1  . Insulin Syringe-Needle U-100 (INSULIN SYRINGE 1CC/31GX5/16") 31G X 5/16" 1 ML MISC Use to inject insulin 100 each 3  . levothyroxine (SYNTHROID, LEVOTHROID) 100 MCG tablet TAKE 1 TABLET (100 MCG TOTAL) BY MOUTH DAILY. 90 tablet 1  . lidocaine-prilocaine (EMLA) cream APPLY SMALL AMOUNT TO DIALYSIS SITE 30 -60 MIUNTES PRIOR TO DIALYSIS 3X A WEEK  4  . methocarbamol (ROBAXIN) 500 MG tablet Take 500 mg by mouth daily.    . nebivolol (BYSTOLIC) 2.5 MG tablet Take 1 tablet on NON-DIALYSIS DAYS. 30 tablet 6  . polyethylene glycol (MIRALAX / GLYCOLAX) packet Take 17 g by mouth daily.    Marland Kitchen  rosuvastatin (CRESTOR) 10 MG tablet Take 1 tablet (10 mg total) by mouth daily. (Patient taking differently: Take 20 mg by mouth daily. ) 90 tablet 1  . senna (SENOKOT) 8.6 MG tablet Take 1 tablet by mouth daily.    . SENSIPAR 60 MG tablet Take 60 mg by mouth daily.     . traMADol (ULTRAM) 50 MG tablet Take 1 tablet (50 mg total) by mouth every 6 (six) hours as needed. 15 tablet 0  . traZODone (DESYREL) 50 MG tablet Take 1 tablet by mouth at bedtime.    . TRESIBA FLEXTOUCH 100 UNIT/ML SOPN FlexTouch Pen INJECT 30 UNITS INTO THE SKIN EVERY MORNING 45 mL 1  . zolpidem (AMBIEN) 10 MG tablet Take 10 mg by mouth at bedtime as needed for sleep.     No current facility-administered medications for this visit.     Allergies as of 09/04/2016  . (No Known Allergies)    Family History  Problem Relation Age of Onset  . Colon polyps Father   . Colon cancer Neg Hx   . Stomach cancer Neg Hx     Social History   Social History  . Marital status: Married    Spouse name: N/A  . Number of children: 3  . Years of education: N/A   Occupational History  . 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 Smoking status: Former Smoker    Types: Cigars    Quit date: 12/06/2012  . Smokeless tobacco: Never Used  . Alcohol use 0.6 oz/week    1 Glasses of wine per week     Comment: 1 glass of wine per week, rare use as of 05/25/15  . Drug use: No  . Sexual activity: Not on file   Other Topics Concern  . Not on file   Social History Narrative  . No narrative on file     Physical Exam: BP 130/80   Pulse 80   Ht 6' (1.829 m)   Wt 298 lb 12.8 oz (135.5 kg)   BMI 40.52 kg/m  Constitutional: generally well-appearing Psychiatric: alert and oriented x3 Abdomen: soft, nontender, nondistended, no obvious ascites, no peritoneal signs, normal bowel sounds No peripheral edema noted in lower extremities  Assessment and plan: 55 y.o. male with history of precancerous colon polyps, Hemoccult-positive stool  He should have surveillance colonoscopy October 2022 which is 5 years from his last one. That is consistent with screening, surveillance guidelines. He does not need any interval colon cancer screening prior to then including annual Hemoccult testing. His upper endoscopy was normal. There is no reason to evaluate his Hemoccult stool any further at this point.   Owens Loffler, MD [facility] 09/04/2016, 9:23 AM

## 2016-09-04 NOTE — Patient Instructions (Signed)
Colonoscopy 07/2021 for polyp surveillance. No need for colon cancer screening (including stool testing) prior to then. Will fax colonoscopy/EGD and associated path reports to your transplant coordinator at [facility].

## 2016-09-14 ENCOUNTER — Observation Stay (HOSPITAL_COMMUNITY)
Admission: EM | Admit: 2016-09-14 | Discharge: 2016-09-16 | Disposition: A | Discharge status: Home / Self Care | Payer: BLUE CROSS/BLUE SHIELD | Attending: Family Medicine | Admitting: Family Medicine

## 2016-09-14 ENCOUNTER — Emergency Department (HOSPITAL_COMMUNITY): Payer: BLUE CROSS/BLUE SHIELD

## 2016-09-14 ENCOUNTER — Encounter (HOSPITAL_COMMUNITY): Payer: Self-pay

## 2016-09-14 DIAGNOSIS — E1122 Type 2 diabetes mellitus with diabetic chronic kidney disease: Secondary | ICD-10-CM | POA: Diagnosis not present

## 2016-09-14 DIAGNOSIS — Z87891 Personal history of nicotine dependence: Secondary | ICD-10-CM | POA: Diagnosis not present

## 2016-09-14 DIAGNOSIS — Z85828 Personal history of other malignant neoplasm of skin: Secondary | ICD-10-CM | POA: Diagnosis not present

## 2016-09-14 DIAGNOSIS — Z79899 Other long term (current) drug therapy: Secondary | ICD-10-CM | POA: Diagnosis not present

## 2016-09-14 DIAGNOSIS — N186 End stage renal disease: Secondary | ICD-10-CM | POA: Diagnosis not present

## 2016-09-14 DIAGNOSIS — I120 Hypertensive chronic kidney disease with stage 5 chronic kidney disease or end stage renal disease: Secondary | ICD-10-CM | POA: Insufficient documentation

## 2016-09-14 DIAGNOSIS — I471 Supraventricular tachycardia: Secondary | ICD-10-CM | POA: Diagnosis not present

## 2016-09-14 DIAGNOSIS — E785 Hyperlipidemia, unspecified: Secondary | ICD-10-CM | POA: Diagnosis not present

## 2016-09-14 DIAGNOSIS — E1140 Type 2 diabetes mellitus with diabetic neuropathy, unspecified: Secondary | ICD-10-CM | POA: Insufficient documentation

## 2016-09-14 DIAGNOSIS — G4733 Obstructive sleep apnea (adult) (pediatric): Secondary | ICD-10-CM | POA: Diagnosis present

## 2016-09-14 DIAGNOSIS — Z6839 Body mass index (BMI) 39.0-39.9, adult: Secondary | ICD-10-CM | POA: Diagnosis not present

## 2016-09-14 DIAGNOSIS — N1832 Chronic kidney disease, stage 3b: Secondary | ICD-10-CM

## 2016-09-14 DIAGNOSIS — Z794 Long term (current) use of insulin: Secondary | ICD-10-CM | POA: Diagnosis not present

## 2016-09-14 DIAGNOSIS — Z992 Dependence on renal dialysis: Secondary | ICD-10-CM | POA: Diagnosis not present

## 2016-09-14 DIAGNOSIS — E039 Hypothyroidism, unspecified: Secondary | ICD-10-CM | POA: Diagnosis not present

## 2016-09-14 DIAGNOSIS — Z905 Acquired absence of kidney: Secondary | ICD-10-CM | POA: Insufficient documentation

## 2016-09-14 DIAGNOSIS — E1165 Type 2 diabetes mellitus with hyperglycemia: Secondary | ICD-10-CM

## 2016-09-14 DIAGNOSIS — K5900 Constipation, unspecified: Secondary | ICD-10-CM | POA: Diagnosis not present

## 2016-09-14 DIAGNOSIS — I10 Essential (primary) hypertension: Secondary | ICD-10-CM | POA: Diagnosis present

## 2016-09-14 LAB — BASIC METABOLIC PANEL
ANION GAP: 14 (ref 5–15)
BUN: 25 mg/dL — ABNORMAL HIGH (ref 6–20)
CALCIUM: 8.8 mg/dL — AB (ref 8.9–10.3)
CHLORIDE: 94 mmol/L — AB (ref 101–111)
CO2: 27 mmol/L (ref 22–32)
Creatinine, Ser: 5.43 mg/dL — ABNORMAL HIGH (ref 0.61–1.24)
GFR calc Af Amer: 12 mL/min — ABNORMAL LOW (ref >60)
GFR calc non Af Amer: 11 mL/min — ABNORMAL LOW (ref >60)
GLUCOSE: 124 mg/dL — AB (ref 65–99)
Potassium: 3.2 mmol/L — ABNORMAL LOW (ref 3.5–5.1)
Sodium: 135 mmol/L (ref 135–145)

## 2016-09-14 LAB — CBC WITH DIFFERENTIAL/PLATELET
BASOS ABS: 0 10*3/uL (ref 0.0–0.1)
Basophils Relative: 0 %
Eosinophils Absolute: 0.2 10*3/uL (ref 0.0–0.7)
Eosinophils Relative: 3 %
HEMATOCRIT: 33.2 % — AB (ref 39.0–52.0)
HEMOGLOBIN: 11.2 g/dL — AB (ref 13.0–17.0)
LYMPHS PCT: 21 %
Lymphs Abs: 1.5 10*3/uL (ref 0.7–4.0)
MCH: 32 pg (ref 26.0–34.0)
MCHC: 33.7 g/dL (ref 30.0–36.0)
MCV: 94.9 fL (ref 78.0–100.0)
MONO ABS: 0.7 10*3/uL (ref 0.1–1.0)
MONOS PCT: 10 %
NEUTROS ABS: 4.8 10*3/uL (ref 1.7–7.7)
NEUTROS PCT: 66 %
Platelets: 197 10*3/uL (ref 150–400)
RBC: 3.5 MIL/uL — ABNORMAL LOW (ref 4.22–5.81)
RDW: 15.6 % — AB (ref 11.5–15.5)
WBC: 7.1 10*3/uL (ref 4.0–10.5)

## 2016-09-14 LAB — TSH: TSH: 1.108 u[IU]/mL (ref 0.350–4.500)

## 2016-09-14 LAB — I-STAT TROPONIN, ED: Troponin i, poc: 0.05 ng/mL (ref 0.00–0.08)

## 2016-09-14 MED ORDER — VERAPAMIL HCL 80 MG PO TABS
80.0000 mg | ORAL_TABLET | Freq: Once | ORAL | Status: AC
  Administered 2016-09-14: 80 mg via ORAL
  Filled 2016-09-14: qty 1

## 2016-09-14 NOTE — ED Provider Notes (Signed)
[facility] DEPT Provider Note   CSN: [id number] Arrival date & time:        History   Chief Complaint Chief Complaint  Patient presents with  . Tachycardia    HPI Robert [name] is a 55 y.o. male.  HPI Robert [name] is a 55 y.o. male with PMH significant for ESRD on dialysis MWF, PSVT (Dr. Claiborne Billings), HTN, DM, and hypothyroidism who presents in SVT.  Patient was in his dialysis session PTA when he went into SVT.  He reports hx of PSVT for which he took Verapamil.  No ablation procedure performed at that time.  He reduced his caffeine intake and controlled his diabetes.  He was taken off this about 2 years ago, and has not had an episode of SVT since then.  He denies any CP, SOB, dizziness, syncope, fever, chills.  No medications PTA.    Past Medical History:  Diagnosis Date  . Anemia    low hgb at present  . Arthritis    FEET  . Blood transfusion without reported diagnosis 1974   kidney removed - L  . Cancer ([place])    skin ca right shoulder, plastic dsyplasia, pre-Ca polpys removed on Colonoscopy- 07/2014  . Colon polyps 07/23/2014   Tubular adenomas x 5  . Constipation   . Diabetes mellitus without complication (HCC)    Type 2  . Difficult intubation    unsure of actual problem but it was during the January 28, 2015 procedure.  Marland Kitchen Dysrhythmia   . Eczema   . ESRD (end stage renal disease) ([place])    hEMO mwf  . Headache(784.0)    slight dull h/a due kidney failure  . Hyperlipidemia   . Hypertension    SVT h/o , followed by Dr. Claiborne Billings  . Hypothyroidism   . Macular degeneration   . Neuropathy (HCC)    right hand and both feet  . PAT (paroxysmal atrial tachycardia) ([place])   . PSVT (paroxysmal supraventricular tachycardia) ([place])   . SBO (small bowel obstruction)   . Shortness of breath    with exertion  . Sleep apnea 06/18/11   split-night sleep study- [facility]., BiPAP- 13-16  . Thyroid disease   . Umbilical hernia    will repair with thi  surgery    Patient Active Problem List   Diagnosis Date Noted  . Benign neoplasm of transverse colon   . Diverticulosis of colon without hemorrhage   . Heme positive stool   . Morbid obesity ([place]) 02/03/2016  . 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, with long-term current use of insulin ([place]) 07/14/2015  . Nausea & vomiting   . HTN (hypertension) 05/11/2015  . Chronic renal failure   . SBO (small bowel obstruction) 05/04/2015  . Protein-calorie malnutrition, severe ([place]) 06/15/2014  . Uremia of renal origin 06/12/2014  . End stage renal disease on dialysis ([place]) 03/22/2014  . Fatigue 01/08/2014  . H/O unilateral nephrectomy 09/06/2013  . Diabetes mellitus type 2 in obese ([place]) 09/06/2013  . SVT (supraventricular tachycardia) ([place]) 09/06/2013  . Lower extremity edema 09/06/2013  . Severe obstructive sleep apnea 09/06/2013  . Hypothyroidism 09/06/2013  . Hyperlipidemia with target LDL less than 70 09/06/2013    Past Surgical History:  Procedure Laterality Date  . AV FISTULA PLACEMENT Right 02/25/2015   Procedure: RIGHT ARTERIOVENOUS (AV) FISTULA CREATION;  Surgeon: Serafina Mitchell, MD;  Location: [facility];  Service: Vascular;  Laterality: Right;  .  AV FISTULA PLACEMENT Right 09/15/2015   Procedure: ARTERIOVENOUS (AV) FISTULA CREATION- RIGHT ARM;  Surgeon: Serafina Mitchell, MD;  Location: [facility];  Service: Vascular;  Laterality: Right;  . CAPD INSERTION N/A 05/18/2014   Procedure: LAPAROSCOPIC INSERTION CONTINUOUS AMBULATORY PERITONEAL DIALYSIS  (CAPD) CATHETER;  Surgeon: Ralene Ok, MD;  Location: [facility];  Service: General;  Laterality: N/A;  . COLONOSCOPY    . COLONOSCOPY WITH PROPOFOL N/A 07/12/2016   Procedure: COLONOSCOPY WITH PROPOFOL;  Surgeon: [name], MD;  Location: [facility];  Service: Endoscopy;  Laterality: N/A;  . declotting of fistula  08/2015  . ESOPHAGOGASTRODUODENOSCOPY (EGD) WITH PROPOFOL N/A 07/12/2016    Procedure: ESOPHAGOGASTRODUODENOSCOPY (EGD) WITH PROPOFOL;  Surgeon: [name], MD;  [facility];  Service: Endoscopy;  Laterality: N/A;  . FISTULA SUPERFICIALIZATION Right 02/07/2016   Procedure: RIIGHT UPPER ARM FISTULA SUPERFICIALIZATION;  Surgeon: Elam Dutch, MD;  Location: [facility];  Service: Vascular;  Laterality: Right;  . IJ catheter insertion    . INSERTION OF DIALYSIS CATHETER Right 02/25/2015   Procedure: INSERTION OF RIGHT INTERNAL JUGULAR DIALYSIS CATHETER;  Surgeon: Serafina Mitchell, MD;  Location: [facility];  Service: Vascular;  Laterality: Right;  . LAPAROSCOPIC REPOSITIONING CAPD CATHETER N/A 06/16/2014   Procedure: LAPAROSCOPIC REPOSITIONING CAPD CATHETER;  Surgeon: Ralene Ok, MD;  Location: [facility];  Service: General;  Laterality: N/A;  . LAPAROSCOPY N/A 05/04/2015   Procedure: LAPAROSCOPY DIAGNOSTIC LYSIS OF ADHESIONS;  Surgeon: Coralie Keens, MD;  Location: [facility];  Service: General;  Laterality: N/A;  . MINOR REMOVAL OF PERITONEAL DIALYSIS CATHETER N/A 04/28/2015   Procedure:  REMOVAL OF PERITONEAL DIALYSIS CATHETER;  Surgeon: Coralie Keens, MD;  Location: [facility];  Service: General;  Laterality: N/A;  . NEPHRECTOMY Left 1974  . RENAL BIOPSY Right 2012  . REVISON OF ARTERIOVENOUS FISTULA Right 05/26/2015   Procedure: SUPERFICIALIZATION OF ARTERIOVENOUS FISTULA WITH SIDE BRANCH LIGATIONS;  Surgeon: Serafina Mitchell, MD;  Location: [facility];  Service: Vascular;  Laterality: Right;  . SKIN CANCER EXCISION     right shoulder  . UMBILICAL HERNIA REPAIR N/A 05/18/2014   Procedure: HERNIA REPAIR UMBILICAL ADULT;  Surgeon: Ralene Ok, MD;  Location: [facility];  Service: General;  Laterality: N/A;  . UMBILICAL HERNIA REPAIR N/A 04/28/2015   Procedure: UMBILICAL HERNIA REPAIR WITH MESH;  Surgeon: Coralie Keens, MD;  Location: [facility];  Service: General;  Laterality: N/A;       Home Medications    Prior to Admission medications   Medication Sig Start Date End Date  Taking? Authorizing Provider  calcitRIOL (ROCALTROL) 0.25 MCG capsule Take 0.75 mcg by mouth every Monday, Wednesday, and Friday. DURING DIALYSIS   Yes Historical Provider, MD  calcium acetate (PHOSLO) 667 MG capsule Take 667 mg by mouth 3 (three) times daily with meals.   Yes Historical Provider, MD  cetirizine (ZYRTEC) 10 MG tablet Take 10 mg by mouth every morning.    Yes Historical Provider, MD  cyclobenzaprine (FLEXERIL) 5 MG tablet Take 5 mg by mouth 3 (three) times daily as needed for cramping. 06/27/16  Yes Historical Provider, MD  docusate sodium (COLACE) 100 MG capsule Take 100 mg by mouth daily.    Yes Historical Provider, MD  DULoxetine (CYMBALTA) 30 MG capsule Take 30 mg by mouth daily.  03/09/16  Yes Historical Provider, MD  ethyl chloride spray USE PRE DIALYSIS AS NEEDED 01/12/16  Yes Historical Provider, MD  fluocinonide cream (LIDEX) 3.15 % Apply 1 application topically daily  as needed (Skin inflammation (hand, thigh, back)).    Yes Historical Provider, MD  FOSRENOL 1000 MG chewable tablet Chew 1,000-2,000 mg by mouth See admin instructions. 2000 mg with every meal and 1000 mg with each snack 12/29/14  Yes Historical Provider, MD  gabapentin (NEURONTIN) 100 MG capsule Take 200 mg by mouth every morning.  06/02/16  Yes Historical Provider, MD  glucose blood (ONETOUCH VERIO) test strip Use as instructed to check blood sugar 6 a day dx code E11.29 Patient taking differently: Use as instructed to check blood sugar 6x a day dx code E11.29 08/04/14  Yes Elayne Snare, MD  HUMULIN R 500 UNIT/ML injection Using a U-100 1cc/31G/5/16" INSULIN SYRINGE, draw up and inject 5-18 units into the skin three times a day 30 minutes prior to each meal 03/07/16  Yes Historical Provider, MD  Insulin Pen Needle 31G X 5 MM MISC Use 4 per day to inject insulin. 12/19/15  Yes Elayne Snare, MD  Insulin Syringe-Needle U-100 (INSULIN SYRINGE 1CC/31GX5/16") 31G X 5/16" 1 ML MISC Use to inject insulin 08/12/14  Yes Elayne Snare, MD   levothyroxine (SYNTHROID, LEVOTHROID) 100 MCG tablet TAKE 1 TABLET (100 MCG TOTAL) BY MOUTH DAILY. 05/25/16  Yes Elayne Snare, MD  lidocaine-prilocaine (EMLA) cream APPLY SMALL AMOUNT TO DIALYSIS SITE 30 -60 MINUTES PRIOR TO DIALYSIS 3X A WEEK 12/16/15  Yes Historical Provider, MD  methocarbamol (ROBAXIN) 500 MG tablet Take 500 mg by mouth daily as needed for muscle spasms.    Yes Historical Provider, MD  polyethylene glycol (MIRALAX / GLYCOLAX) packet Take 17 g by mouth daily.   Yes Historical Provider, MD  rosuvastatin (CRESTOR) 10 MG tablet Take 1 tablet (10 mg total) by mouth daily. 03/27/16  Yes Elayne Snare, MD  senna (SENOKOT) 8.6 MG tablet Take 1 tablet by mouth daily.   Yes Historical Provider, MD  SENSIPAR 60 MG tablet Take 60 mg by mouth daily.  01/09/16  Yes Historical Provider, MD  traMADol (ULTRAM) 50 MG tablet Take 1 tablet (50 mg total) by mouth every 6 (six) hours as needed. Patient taking differently: Take 50-100 mg by mouth every 6 (six) hours as needed (for pain).  02/07/16  Yes Ulyses Amor, PA-C  traZODone (DESYREL) 50 MG tablet Take 50 mg by mouth See admin instructions. Every other night at bedtime for sleep 08/18/16  Yes Historical Provider, MD  TRESIBA FLEXTOUCH 100 UNIT/ML SOPN FlexTouch Pen INJECT 30 [place] MORNING Patient taking differently: INJECT 28 UNITS INTO THE SKIN EVERY MORNING 07/22/16  Yes Elayne Snare, MD  zolpidem (AMBIEN) 10 MG tablet Take 10 mg by mouth See admin instructions. Every other night at bedtime for sleep   Yes Historical Provider, MD  nebivolol (BYSTOLIC) 2.5 MG tablet Take 1 tablet on NON-DIALYSIS DAYS. Patient not taking: Reported on 09/14/2016 07/26/16   Troy Sine, MD    Family History Family History  Problem Relation Age of Onset  . Colon polyps Father   . Colon cancer Neg Hx   . Stomach cancer Neg Hx     Social History Social History  Substance Use Topics  . Smoking status: Former Smoker    Types: Cigars    Quit  date: 12/06/2012  . Smokeless tobacco: Never Used  . Alcohol use 0.6 oz/week    1 Glasses of wine per week     Comment: 1 glass of wine per week, rare use as of 05/25/15     Allergies   Patient  has no known allergies.   Review of Systems Review of Systems All other systems negative unless otherwise stated in HPI   Physical Exam Updated Vital Signs BP 118/71   Pulse 86   Temp 98.1 F (36.7 C) (Oral)   Resp 14   Ht 6\' 1"  (1.854 m)   Wt 134 kg   SpO2 100%   BMI 38.98 kg/m   Physical Exam  Constitutional: He is oriented to person, place, and time. He appears well-developed and well-nourished.  Non-toxic appearance. He does not have a sickly appearance. He does not appear ill.  HENT:  Head: Normocephalic and atraumatic.  Mouth/Throat: Oropharynx is clear and moist.  Eyes: Conjunctivae are normal.  Neck: Normal range of motion. Neck supple.  Cardiovascular: Regular rhythm.  Tachycardia present.   Pulmonary/Chest: Effort normal and breath sounds normal. No accessory muscle usage or stridor. No respiratory distress. He has no wheezes. He has no rhonchi. He has no rales.  Abdominal: Soft. Bowel sounds are normal. He exhibits no distension. There is no tenderness.  Musculoskeletal: Normal range of motion.  Lymphadenopathy:    He has no cervical adenopathy.  Neurological: He is alert and oriented to person, place, and time.  Speech clear without dysarthria.  Skin: Skin is warm and dry.  Fistula RUE.   Psychiatric: He has a normal mood and affect. His behavior is normal.     ED Treatments / Results  Labs (all labs ordered are listed, but only abnormal results are displayed) Labs Reviewed  CBC WITH DIFFERENTIAL/PLATELET - Abnormal; Notable for the following:       Result Value   RBC 3.50 (*)    Hemoglobin 11.2 (*)    HCT 33.2 (*)    RDW 15.6 (*)    All other components within normal limits  BASIC METABOLIC PANEL - Abnormal; Notable for the following:    Potassium 3.2  (*)    Chloride 94 (*)    Glucose, Bld 124 (*)    BUN 25 (*)    Creatinine, Ser 5.43 (*)    Calcium 8.8 (*)    GFR calc non Af Amer 11 (*)    GFR calc Af Amer 12 (*)    All other components within normal limits  TSH  I-STAT TROPOININ, ED  I-STAT TROPOININ, ED    EKG  EKG Interpretation  Date/Time:  Friday September 14 2016 21:00:28 EST Ventricular Rate:  88 PR Interval:    QRS Duration: 100 QT Interval:  415 QTC Calculation: 503 R Axis:   0 Text Interpretation:  Sinus tachycardia Paired ventricular premature complexes Low voltage, precordial leads Prolonged QT interval Baseline wander in lead(s) V3 No significant change since last tracing Confirmed by YAO  MD, DAVID ([id number]) on 09/14/2016 9:12:15 PM       Radiology Dg Chest Portable 1 View  Result Date: 09/14/2016 CLINICAL DATA:  Cardiac a arrhythmias tonight. EXAM: PORTABLE CHEST 1 VIEW COMPARISON:  05/10/2015 FINDINGS: A single AP portable view of the chest demonstrates no focal airspace consolidation or alveolar edema. The lungs are grossly clear. Chronic blunting of the costophrenic angles, unchanged. There is no large effusion or pneumothorax. Cardiac and mediastinal contours appear unremarkable. IMPRESSION: No acute disease. Electronically Signed   By: Andreas Newport M.D.   On: 09/14/2016 21:24    Procedures Procedures (including critical care time)  Medications Ordered in ED Medications  verapamil (CALAN) tablet 80 mg (80 mg Oral Given 09/14/16 2150)     Initial Impression /  Assessment and Plan / ED Course  I have reviewed the triage vital signs and the nursing notes.  Pertinent labs & imaging results that were available during my care of the patient were reviewed by me and considered in my medical decision making (see chart for details).  Clinical Course    Patient presents in stable SVT.  SVT resolved with vagal maneuvers, but spontaneously reverts back to SVT.  Per chart review, patient with AVNRT.   Therefore, withheld Adenosine (preexcitation).  Cardiology consulted.  They will evaluate the patient in the ED and also recommend 80 mg PO Verapamil.  Labs without acute abnormalities, aside from Cr.  CXR unremarkable.  Troponin normal.  Upon reassessment, patient continues to be rate controlled in NSR and asymptomatic.  Per cardiology, recommend medicine admission over night for observation.  Cardiology will follow along.  Patient will need outpatient EP follow up.  Appreciate TRH.  Final Clinical Impressions(s) / ED Diagnoses   Final diagnoses:  SVT (supraventricular tachycardia) [facility])    New Prescriptions New Prescriptions   No medications on file     Gloriann Loan, Hershal Coria 09/15/16 0029    Drenda Freeze, MD 09/15/16 1615

## 2016-09-14 NOTE — ED Triage Notes (Signed)
Pt brought in by [facility]. Pt was at dialysis and started going into SVT. Pt c/o feeling like heart is fluttering. Pt still has dialysis port access.

## 2016-09-15 ENCOUNTER — Encounter (HOSPITAL_COMMUNITY): Payer: Self-pay | Admitting: Internal Medicine

## 2016-09-15 DIAGNOSIS — N186 End stage renal disease: Secondary | ICD-10-CM | POA: Diagnosis not present

## 2016-09-15 DIAGNOSIS — I471 Supraventricular tachycardia: Secondary | ICD-10-CM | POA: Diagnosis not present

## 2016-09-15 DIAGNOSIS — Z794 Long term (current) use of insulin: Secondary | ICD-10-CM

## 2016-09-15 DIAGNOSIS — Z992 Dependence on renal dialysis: Secondary | ICD-10-CM

## 2016-09-15 DIAGNOSIS — E1165 Type 2 diabetes mellitus with hyperglycemia: Secondary | ICD-10-CM

## 2016-09-15 DIAGNOSIS — R000 Tachycardia, unspecified: Secondary | ICD-10-CM | POA: Diagnosis not present

## 2016-09-15 DIAGNOSIS — E1122 Type 2 diabetes mellitus with diabetic chronic kidney disease: Secondary | ICD-10-CM

## 2016-09-15 LAB — CBC
HCT: 32.4 % — ABNORMAL LOW (ref 39.0–52.0)
HEMOGLOBIN: 10.7 g/dL — AB (ref 13.0–17.0)
MCH: 31.4 pg (ref 26.0–34.0)
MCHC: 33 g/dL (ref 30.0–36.0)
MCV: 95 fL (ref 78.0–100.0)
Platelets: 181 10*3/uL (ref 150–400)
RBC: 3.41 MIL/uL — AB (ref 4.22–5.81)
RDW: 15.4 % (ref 11.5–15.5)
WBC: 5.3 10*3/uL (ref 4.0–10.5)

## 2016-09-15 LAB — BASIC METABOLIC PANEL
ANION GAP: 15 (ref 5–15)
BUN: 32 mg/dL — ABNORMAL HIGH (ref 6–20)
CALCIUM: 9 mg/dL (ref 8.9–10.3)
CO2: 28 mmol/L (ref 22–32)
Chloride: 95 mmol/L — ABNORMAL LOW (ref 101–111)
Creatinine, Ser: 7.04 mg/dL — ABNORMAL HIGH (ref 0.61–1.24)
GFR calc Af Amer: 9 mL/min — ABNORMAL LOW (ref >60)
GFR, EST NON AFRICAN AMERICAN: 8 mL/min — AB (ref >60)
Glucose, Bld: 125 mg/dL — ABNORMAL HIGH (ref 65–99)
Potassium: 3.8 mmol/L (ref 3.5–5.1)
Sodium: 138 mmol/L (ref 135–145)

## 2016-09-15 LAB — TSH: TSH: 1.254 u[IU]/mL (ref 0.350–4.500)

## 2016-09-15 LAB — I-STAT TROPONIN, ED: Troponin i, poc: 0.07 ng/mL (ref 0.00–0.08)

## 2016-09-15 LAB — GLUCOSE, CAPILLARY
GLUCOSE-CAPILLARY: 97 mg/dL (ref 65–99)
GLUCOSE-CAPILLARY: 124 mg/dL — AB (ref 65–99)
Glucose-Capillary: 129 mg/dL — ABNORMAL HIGH (ref 65–99)
Glucose-Capillary: 129 mg/dL — ABNORMAL HIGH (ref 65–99)
Glucose-Capillary: 133 mg/dL — ABNORMAL HIGH (ref 65–99)

## 2016-09-15 LAB — MRSA PCR SCREENING: MRSA by PCR: NEGATIVE

## 2016-09-15 MED ORDER — ROSUVASTATIN CALCIUM 10 MG PO TABS
10.0000 mg | ORAL_TABLET | Freq: Every day | ORAL | Status: DC
  Administered 2016-09-15 – 2016-09-16 (×2): 10 mg via ORAL
  Filled 2016-09-15 (×2): qty 1

## 2016-09-15 MED ORDER — CINACALCET HCL 30 MG PO TABS
60.0000 mg | ORAL_TABLET | Freq: Every day | ORAL | Status: DC
  Administered 2016-09-15 – 2016-09-16 (×2): 60 mg via ORAL
  Filled 2016-09-15 (×2): qty 2

## 2016-09-15 MED ORDER — CYCLOBENZAPRINE HCL 5 MG PO TABS: 5.0000 mg | ORAL_TABLET | Freq: Three times a day (TID) | ORAL | Status: DC | PRN

## 2016-09-15 MED ORDER — INSULIN REGULAR HUMAN (CONC) 500 UNIT/ML ~~LOC~~ SOPN
5.0000 [IU] | PEN_INJECTOR | Freq: Three times a day (TID) | SUBCUTANEOUS | Status: DC
  Administered 2016-09-15 – 2016-09-16 (×4): 5 [IU] via SUBCUTANEOUS
  Filled 2016-09-15: qty 3

## 2016-09-15 MED ORDER — TRAMADOL HCL 50 MG PO TABS
50.0000 mg | ORAL_TABLET | Freq: Four times a day (QID) | ORAL | Status: DC | PRN
  Administered 2016-09-15: 100 mg via ORAL
  Administered 2016-09-16: 50 mg via ORAL
  Filled 2016-09-15: qty 2
  Filled 2016-09-15: qty 1

## 2016-09-15 MED ORDER — TRAZODONE HCL 50 MG PO TABS
50.0000 mg | ORAL_TABLET | ORAL | Status: DC
  Filled 2016-09-15: qty 1

## 2016-09-15 MED ORDER — GABAPENTIN 100 MG PO CAPS
200.0000 mg | ORAL_CAPSULE | Freq: Every morning | ORAL | Status: DC
  Administered 2016-09-15 – 2016-09-16 (×2): 200 mg via ORAL
  Filled 2016-09-15 (×2): qty 2

## 2016-09-15 MED ORDER — INSULIN GLARGINE 100 UNIT/ML ~~LOC~~ SOLN
28.0000 [IU] | Freq: Every day | SUBCUTANEOUS | Status: DC
  Filled 2016-09-15 (×2): qty 0.28

## 2016-09-15 MED ORDER — ACETAMINOPHEN 650 MG RE SUPP: 650.0000 mg | Freq: Four times a day (QID) | RECTAL | Status: DC | PRN

## 2016-09-15 MED ORDER — CALCIUM ACETATE (PHOS BINDER) 667 MG PO CAPS
667.0000 mg | ORAL_CAPSULE | Freq: Three times a day (TID) | ORAL | Status: DC
  Administered 2016-09-15 – 2016-09-16 (×5): 667 mg via ORAL
  Filled 2016-09-15 (×5): qty 1

## 2016-09-15 MED ORDER — METHOCARBAMOL 500 MG PO TABS: 500.0000 mg | ORAL_TABLET | Freq: Every day | ORAL | Status: DC | PRN

## 2016-09-15 MED ORDER — LANTHANUM CARBONATE 500 MG PO CHEW
2000.0000 mg | CHEWABLE_TABLET | Freq: Three times a day (TID) | ORAL | Status: DC
  Administered 2016-09-15 – 2016-09-16 (×3): 2000 mg via ORAL
  Filled 2016-09-15 (×6): qty 4

## 2016-09-15 MED ORDER — ONDANSETRON HCL 4 MG/2ML IJ SOLN: 4.0000 mg | Freq: Four times a day (QID) | INTRAMUSCULAR | Status: DC | PRN

## 2016-09-15 MED ORDER — ONDANSETRON HCL 4 MG PO TABS: 4.0000 mg | ORAL_TABLET | Freq: Four times a day (QID) | ORAL | Status: DC | PRN

## 2016-09-15 MED ORDER — ACETAMINOPHEN 325 MG PO TABS: 650.0000 mg | ORAL_TABLET | Freq: Four times a day (QID) | ORAL | Status: DC | PRN

## 2016-09-15 MED ORDER — LANTHANUM CARBONATE 500 MG PO CHEW
1000.0000 mg | CHEWABLE_TABLET | ORAL | Status: DC | PRN
  Filled 2016-09-15: qty 2

## 2016-09-15 MED ORDER — DULOXETINE HCL 30 MG PO CPEP
30.0000 mg | ORAL_CAPSULE | Freq: Every day | ORAL | Status: DC
  Administered 2016-09-15 – 2016-09-16 (×2): 30 mg via ORAL
  Filled 2016-09-15 (×2): qty 1

## 2016-09-15 MED ORDER — ZOLPIDEM TARTRATE 5 MG PO TABS
10.0000 mg | ORAL_TABLET | ORAL | Status: DC
  Administered 2016-09-15: 10 mg via ORAL
  Filled 2016-09-15: qty 2

## 2016-09-15 MED ORDER — SENNA 8.6 MG PO TABS
1.0000 | ORAL_TABLET | Freq: Every day | ORAL | Status: DC
  Administered 2016-09-15 – 2016-09-16 (×2): 8.6 mg via ORAL
  Filled 2016-09-15 (×2): qty 1

## 2016-09-15 MED ORDER — HEPARIN SODIUM (PORCINE) 5000 UNIT/ML IJ SOLN
5000.0000 [IU] | Freq: Three times a day (TID) | INTRAMUSCULAR | Status: DC
  Filled 2016-09-15 (×2): qty 1

## 2016-09-15 MED ORDER — CALCITRIOL 0.5 MCG PO CAPS: 0.7500 ug | ORAL_CAPSULE | ORAL | Status: DC

## 2016-09-15 MED ORDER — LEVOTHYROXINE SODIUM 100 MCG PO TABS
100.0000 ug | ORAL_TABLET | Freq: Every day | ORAL | Status: DC
  Administered 2016-09-15 – 2016-09-16 (×2): 100 ug via ORAL
  Filled 2016-09-15 (×2): qty 1

## 2016-09-15 MED ORDER — VERAPAMIL HCL 80 MG PO TABS
80.0000 mg | ORAL_TABLET | Freq: Three times a day (TID) | ORAL | Status: DC
  Administered 2016-09-15 – 2016-09-16 (×4): 80 mg via ORAL
  Filled 2016-09-15 (×5): qty 1

## 2016-09-15 MED ORDER — LORATADINE 10 MG PO TABS
10.0000 mg | ORAL_TABLET | Freq: Every day | ORAL | Status: DC
  Administered 2016-09-15 – 2016-09-16 (×2): 10 mg via ORAL
  Filled 2016-09-15 (×2): qty 1

## 2016-09-15 MED ORDER — DOCUSATE SODIUM 100 MG PO CAPS
100.0000 mg | ORAL_CAPSULE | Freq: Every day | ORAL | Status: DC
  Administered 2016-09-15 – 2016-09-16 (×2): 100 mg via ORAL
  Filled 2016-09-15 (×3): qty 1

## 2016-09-15 NOTE — Discharge Summary (Signed)
Physician Discharge Summary  Robert [name] BJY:[id number] DOB: May 08, 1961 DOA: 09/14/2016  PCP: Haywood Pao, MD  Admit date: 09/14/2016 Discharge date: 09/15/2016  Time spent: 30 minutes  Recommendations for Outpatient Follow-up:  1. Started verapamil 240 cd this admit.  Will need OP referral to Dr. Curt Bears.  Have given office number for patient to make follow up 2. Needs labs as per dialysis protocol  Discharge Diagnoses:  Principal Problem:   SVT (supraventricular tachycardia) (HCC) Active Problems:   Severe obstructive sleep apnea   Hypothyroidism   End stage renal disease on dialysis (HCC)   HTN (hypertension)   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, with long-term current use of insulin [facility])   Discharge Condition: improved  Diet recommendation:  Renal and diabetic  Filed Weights   09/14/16 2035 09/15/16 0118  Weight: 134 kg (295 lb 6.7 oz) 133.9 kg (295 lb 1.6 oz)    History of present illness:  55 y.o. male with history of ESRD on hemodialysis, diabetes mellitus type 2, hyperlipidemia and chronic anemia was brought to the ER after patient had palpitations during dialysis. EMS was called and patient was found to be in SVT and had vagal maneuvers done which aborted the SVT. In the ER patient had at least 2-3 more recurrence of SVT episodes which were initially aborted with vagal maneuvers He had this about 35yrs prior but never has experieneced it sine He does have OSA  He used to be on Bystolic and had to d/c tyhis 2/2 to low BP  Patient was admitted to hospital-monitored on tele Verapamil 80 tid was given This was consolidated to 240 daily. It was felt he was much improved and he was asked to callCardiologist as an OP to get further EP care  Discharge Exam: Vitals:   09/15/16 0118 09/15/16 0620  BP: (!) 145/78 (!) 147/74  Pulse: 92 81  Resp: 18 18  Temp: 98.8 F (37.1 C) 98 F (36.7 C)    General: eomi  ncat Cardiovascular: s1 s2 no m/r/g-tele some tachy Respiratory: clear no added sound abd soft nt nd no rebound Le in stockings  Discharge Instructions    Current Discharge Medication List    START taking these medications   Details  verapamil (CALAN-SR) 240 MG CR tablet Take 1 tablet (240 mg total) by mouth daily. Qty: 30 tablet, Refills: 3      CONTINUE these medications which have NOT CHANGED   Details  calcitRIOL (ROCALTROL) 0.25 MCG capsule Take 0.75 mcg by mouth every Monday, Wednesday, and Friday. DURING DIALYSIS    calcium acetate (PHOSLO) 667 MG capsule Take 667 mg by mouth 3 (three) times daily with meals.    cetirizine (ZYRTEC) 10 MG tablet Take 10 mg by mouth every morning.     cyclobenzaprine (FLEXERIL) 5 MG tablet Take 5 mg by mouth 3 (three) times daily as needed for cramping.    docusate sodium (COLACE) 100 MG capsule Take 100 mg by mouth daily.     DULoxetine (CYMBALTA) 30 MG capsule Take 30 mg by mouth daily.     ethyl chloride spray USE PRE DIALYSIS AS NEEDED Refills: 10    fluocinonide cream (LIDEX) 0.86 % Apply 1 application topically daily as needed (Skin inflammation (hand, thigh, back)).     FOSRENOL 1000 MG chewable tablet Chew 1,000-2,000 mg by mouth See admin instructions. 2000 mg with every meal and 1000 mg with each snack    gabapentin (NEURONTIN) 100  MG capsule Take 200 mg by mouth every morning.     glucose blood (ONETOUCH VERIO) test strip Use as instructed to check blood sugar 6 a day dx code E11.29 Qty: 550 each, Refills: 3    HUMULIN R 500 UNIT/ML injection Using a U-100 1cc/31G/5/16" INSULIN SYRINGE, draw up and inject 5-18 units into the skin three times a day 30 minutes prior to each meal Refills: 1    Insulin Pen Needle 31G X 5 MM MISC Use 4 per day to inject insulin. Qty: 400 each, Refills: 1    Insulin Syringe-Needle U-100 (INSULIN SYRINGE 1CC/31GX5/16") 31G X 5/16" 1 ML MISC Use to inject insulin Qty: 100 each, Refills: 3     levothyroxine (SYNTHROID, LEVOTHROID) 100 MCG tablet TAKE 1 TABLET (100 MCG TOTAL) BY MOUTH DAILY. Qty: 90 tablet, Refills: 1    lidocaine-prilocaine (EMLA) cream APPLY SMALL AMOUNT TO DIALYSIS SITE 30 -60 MINUTES PRIOR TO DIALYSIS 3X A WEEK Refills: 4    methocarbamol (ROBAXIN) 500 MG tablet Take 500 mg by mouth daily as needed for muscle spasms.     polyethylene glycol (MIRALAX / GLYCOLAX) packet Take 17 g by mouth daily.    rosuvastatin (CRESTOR) 10 MG tablet Take 1 tablet (10 mg total) by mouth daily. Qty: 90 tablet, Refills: 1    senna (SENOKOT) 8.6 MG tablet Take 1 tablet by mouth daily.    SENSIPAR 60 MG tablet Take 60 mg by mouth daily.     traMADol (ULTRAM) 50 MG tablet Take 1 tablet (50 mg total) by mouth every 6 (six) hours as needed. Qty: 15 tablet, Refills: 0    traZODone (DESYREL) 50 MG tablet Take 50 mg by mouth See admin instructions. Every other night at bedtime for sleep    TRESIBA FLEXTOUCH 100 UNIT/ML SOPN FlexTouch Pen INJECT 30 UNITS INTO THE SKIN EVERY MORNING Qty: 45 mL, Refills: 1    zolpidem (AMBIEN) 10 MG tablet Take 10 mg by mouth See admin instructions. Every other night at bedtime for sleep    nebivolol (BYSTOLIC) 2.5 MG tablet Take 1 tablet on NON-DIALYSIS DAYS. Qty: 30 tablet, Refills: 6       No Known Allergies    The results of significant diagnostics from this hospitalization (including imaging, microbiology, ancillary and laboratory) are listed below for reference.    Significant Diagnostic Studies: Dg Chest Portable 1 View  Result Date: 09/14/2016 CLINICAL DATA:  Cardiac a arrhythmias tonight. EXAM: PORTABLE CHEST 1 VIEW COMPARISON:  05/10/2015 FINDINGS: A single AP portable view of the chest demonstrates no focal airspace consolidation or alveolar edema. The lungs are grossly clear. Chronic blunting of the costophrenic angles, unchanged. There is no large effusion or pneumothorax. Cardiac and mediastinal contours appear unremarkable.  IMPRESSION: No acute disease. Electronically Signed   By: Andreas Newport M.D.   On: 09/14/2016 21:24    Microbiology: Recent Results (from the past 240 hour(s))  MRSA PCR Screening     Status: None   Collection Time: 09/15/16  1:26 AM  Result Value Ref Range Status   MRSA by PCR NEGATIVE NEGATIVE Final    Comment:        The GeneXpert MRSA Assay (FDA approved for NASAL specimens only), is one component of a comprehensive MRSA colonization surveillance program. It is not intended to diagnose MRSA infection nor to guide or monitor treatment for MRSA infections.      Labs: Basic Metabolic Panel:  Recent Labs Lab 09/14/16 2051 09/15/16 0459  NA 135 138  K 3.2* 3.8  CL 94* 95*  CO2 27 28  GLUCOSE 124* 125*  BUN 25* 32*  CREATININE 5.43* 7.04*  CALCIUM 8.8* 9.0   Liver Function Tests: No results for input(s): AST, ALT, ALKPHOS, BILITOT, PROT, ALBUMIN in the last 168 hours. No results for input(s): LIPASE, AMYLASE in the last 168 hours. No results for input(s): AMMONIA in the last 168 hours. CBC:  Recent Labs Lab 09/14/16 2051 09/15/16 0459  WBC 7.1 5.3  NEUTROABS 4.8  --   HGB 11.2* 10.7*  HCT 33.2* 32.4*  MCV 94.9 95.0  PLT 197 181   Cardiac Enzymes: No results for input(s): CKTOTAL, CKMB, CKMBINDEX, TROPONINI in the last 168 hours. BNP: BNP (last 3 results) No results for input(s): BNP in the last 8760 hours.  ProBNP (last 3 results) No results for input(s): PROBNP in the last 8760 hours.  CBG:  Recent Labs Lab 09/15/16 0133 09/15/16 0608  GLUCAP 129* 97       Signed:  Nita Sells MD   [facility] 09/15/2016, 9:04 AM

## 2016-09-15 NOTE — Progress Notes (Signed)
Patient is refusing to take Lantus 28 units. CBG 97/ 129.  MD notified.

## 2016-09-15 NOTE — Progress Notes (Signed)
  Seen examined and agree with HPI per DR. Kakrakandy  Monitor overnight and continue Verapamil Likely can d/c home am with referral EP Dr. Curt Bears as OP.  Verneita Griffes, MD [facility] [phone]

## 2016-09-15 NOTE — Consult Note (Signed)
CARDIOLOGY CONSULT NOTE   Referring Physician:  Emergency Room Primary Physician:  Elayne Snare, MD Primary Cardiologist:  Shelva Majestic, MD Reason for Consultation: Tachycardia   HPI:  Patient presents from dialysis with feelings that his heart is fluttering.  He states he had similar episodes over 10 years ago and was treated with verapamil.  As he had not had any other episodes he was weaned off and had seemed to be fine since.  On presentation ECG demonstrated a short RP, narrow complex tachycardia.  While in the ED vagal maneuvers were performed resulting in cessation of the rhythm.  A short time later he again went into the same rhythm and this time terminated spontaneously.  He denies chest pain, dyspnea, lightheadedness, dizziness.    Review of Systems:     Cardiac Review of Systems: {Y] = yes [ ]  = no  Chest Pain [    ]  Resting SOB [   ] Exertional SOB  [  ]  Orthopnea [  ]   Pedal Edema [   ]    Palpitations [ Y ] Syncope  [  ]   Presyncope [   ]  General Review of Systems: [Y] = yes [  ]=no Constitional: recent weight change [  ]; anorexia [  ]; fatigue [  ]; nausea [  ]; night sweats [  ]; fever [  ]; or chills [  ];                                                                     Eyes : blurred vision [  ]; diplopia [   ]; vision changes [  ];  Amaurosis fugax[  ]; Resp: cough [  ];  wheezing[  ];  hemoptysis[  ];  PND [  ];  GI:  gallstones[  ], vomiting[  ];  dysphagia[  ]; melena[  ];  hematochezia [  ]; heartburn[  ];   GU: kidney stones [  ]; hematuria[  ];   dysuria [  ];  nocturia[  ]; incontinence [  ];             Skin: rash, swelling[  ];, hair loss[  ];  peripheral edema[  ];  or itching[  ]; Musculosketetal: myalgias[  ];  joint swelling[  ];  joint erythema[  ];  joint pain[  ];  back pain[  ];  Heme/Lymph: bruising[  ];  bleeding[  ];  anemia[  ];  Neuro: TIA[  ];  headaches[  ];  stroke[  ];  vertigo[  ];  seizures[  ];   paresthesias[  ];  difficulty  walking[  ];  Psych:depression[  ]; anxiety[  ];  Endocrine: diabetes[  ];  thyroid dysfunction[  ];  Other:  Past Medical History:  Diagnosis Date  . Anemia    low hgb at present  . Arthritis    FEET  . Blood transfusion without reported diagnosis 1974   kidney removed - L  . Cancer ([place])    skin ca right shoulder, plastic dsyplasia, pre-Ca polpys removed on Colonoscopy- 07/2014  . Colon polyps 07/23/2014   Tubular adenomas x 5  . Constipation   . Diabetes mellitus without  complication (HCC)    Type 2  . Difficult intubation    unsure of actual problem but it was during the January 28, 2015 procedure.  Marland Kitchen Dysrhythmia   . Eczema   . ESRD (end stage renal disease) ([place])    hEMO mwf  . Headache(784.0)    slight dull h/a due kidney failure  . Hyperlipidemia   . Hypertension    SVT h/o , followed by Dr. Claiborne Billings  . Hypothyroidism   . Macular degeneration   . Neuropathy (HCC)    right hand and both feet  . PAT (paroxysmal atrial tachycardia) ([place])   . PSVT (paroxysmal supraventricular tachycardia) ([place])   . SBO (small bowel obstruction)   . Shortness of breath    with exertion  . Sleep apnea 06/18/11   split-night sleep study- [facility]., BiPAP- 13-16  . Thyroid disease   . Umbilical hernia    will repair with thi surgery    (Not in a hospital admission)  Infusions:   No Known Allergies  Social History   Social History  . Marital status: Married    Spouse name: N/A  . Number of children: 3  . Years of education: N/A   Occupational History  . 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 Smoking status: Former Smoker    Types: Cigars    Quit date: 12/06/2012  . Smokeless tobacco: Never Used  . Alcohol use 0.6 oz/week    1 Glasses of wine per week     Comment: 1 glass of wine per week, rare use as of 05/25/15  . Drug use: No  . Sexual activity: Not on file   Other Topics Concern  . Not on file   Social History Narrative  . No  narrative on file    Family History  Problem Relation Age of Onset  . Colon polyps Father   . Colon cancer Neg Hx   . Stomach cancer Neg Hx     PHYSICAL EXAM: Vitals:   09/14/16 2230 09/14/16 2245  BP: 116/72 118/71  Pulse: 87 86  Resp: 16 14  Temp:      No intake or output data in the 24 hours ending 09/15/16 0012  General:  Well appearing. No respiratory difficulty HEENT: normal Neck: supple. no JVD. Carotids 2+ bilat; no bruits. No lymphadenopathy or thryomegaly appreciated. Cor: PMI nondisplaced. Regular rate & rhythm. No rubs, gallops or murmurs. Lungs: clear Abdomen: soft, nontender, nondistended. No hepatosplenomegaly. No bruits or masses. Good bowel sounds. Extremities: no cyanosis, clubbing, rash, edema Neuro: alert & oriented x 3, cranial nerves grossly intact. moves all 4 extremities w/o difficulty. Affect pleasant.  ECG:  Results for orders placed or performed during the hospital encounter of 09/14/16 (from the past 24 hour(s))  CBC with Differential     Status: Abnormal   Collection Time: 09/14/16  8:51 PM  Result Value Ref Range   WBC 7.1 4.0 - 10.5 K/uL   RBC 3.50 (L) 4.22 - 5.81 MIL/uL   Hemoglobin 11.2 (L) 13.0 - 17.0 g/dL   HCT 33.2 (L) 39.0 - 52.0 %   MCV 94.9 78.0 - 100.0 fL   MCH 32.0 26.0 - 34.0 pg   MCHC 33.7 30.0 - 36.0 g/dL   RDW 15.6 (H) 11.5 - 15.5 %   Platelets 197 150 - 400 K/uL   Neutrophils Relative % 66 %   Neutro Abs 4.8 1.7 - 7.7 K/uL   Lymphocytes Relative  21 %   Lymphs Abs 1.5 0.7 - 4.0 K/uL   Monocytes Relative 10 %   Monocytes Absolute 0.7 0.1 - 1.0 K/uL   Eosinophils Relative 3 %   Eosinophils Absolute 0.2 0.0 - 0.7 K/uL   Basophils Relative 0 %   Basophils Absolute 0.0 0.0 - 0.1 K/uL  Basic metabolic panel     Status: Abnormal   Collection Time: 09/14/16  8:51 PM  Result Value Ref Range   Sodium 135 135 - 145 mmol/L   Potassium 3.2 (L) 3.5 - 5.1 mmol/L   Chloride 94 (L) 101 - 111 mmol/L   CO2 27 22 - 32 mmol/L    Glucose, Bld 124 (H) 65 - 99 mg/dL   BUN 25 (H) 6 - 20 mg/dL   Creatinine, Ser 5.43 (H) 0.61 - 1.24 mg/dL   Calcium 8.8 (L) 8.9 - 10.3 mg/dL   GFR calc non Af Amer 11 (L) >60 mL/min   GFR calc Af Amer 12 (L) >60 mL/min   Anion gap 14 5 - 15  I-Stat Troponin, ED (not at [facility])     Status: None   Collection Time: 09/14/16  9:00 PM  Result Value Ref Range   Troponin i, poc 0.05 0.00 - 0.08 ng/mL   Comment 3          TSH     Status: None   Collection Time: 09/14/16 10:07 PM  Result Value Ref Range   TSH 1.108 0.350 - 4.500 uIU/mL   Dg Chest Portable 1 View  Result Date: 09/14/2016 CLINICAL DATA:  Cardiac a arrhythmias tonight. EXAM: PORTABLE CHEST 1 VIEW COMPARISON:  05/10/2015 FINDINGS: A single AP portable view of the chest demonstrates no focal airspace consolidation or alveolar edema. The lungs are grossly clear. Chronic blunting of the costophrenic angles, unchanged. There is no large effusion or pneumothorax. Cardiac and mediastinal contours appear unremarkable. IMPRESSION: No acute disease. Electronically Signed   By: Andreas Newport M.D.   On: 09/14/2016 21:24     ASSESSMENT/PLAN  Narrow complex tachycardia   Assessment:  Patient with sudden onset and cessation of a short RP, narrow complex tachycardia.  All episodes in the ED started with a PAC followed by a prolonged PR interval.  Given this he likely AVNRT.  Verapamil has been restarted in the ED and since then his rhythm has stabilized.  Would admit to the hospitalist service for observation overnight and recommend outpatient EP study and ablation.     Plan  -  Admit to primary service for observation  -  Continue verapamil 80 mg TID  -  Follow-up as an outpatient for EP study and ablation

## 2016-09-15 NOTE — Progress Notes (Signed)
   Patient seen in consultation earlier this morning.  Telemetry reviewed and Short runs of SVT noted.  No change from suggestions in consult note.

## 2016-09-15 NOTE — H&P (Signed)
History and Physical    Robert [name] NAT:[id number] DOB: 12/24/1960 DOA: 09/14/2016  PCP: Haywood Pao, MD  Patient coming from: Home.  Chief Complaint: Palpitations.  HPI: Robert [name] is a 55 y.o. male with history of ESRD on hemodialysis, diabetes mellitus type 2, hyperlipidemia and chronic anemia was brought to the ER after patient had palpitations during dialysis. EMS was called and patient was found to be in SVT and had vagal maneuvers done which aborted the SVT. In the ER patient had at least 2-3 more recurrence of SVT episodes which were initially aborted with vagal maneuvers. Due to cardiology was consulted and was advised patient to be started on verapamil. Patient denies any chest pain dizziness shortness of breath.  Patient has previous history of SVT and used to be on verapamil until 3 years ago. Patient was recently started 2 months ago on Bystolic which patient was not able to tolerate low blood pressure.   ED Course: Patient was started on verapamil after vagal maneuvers did not help with SVT.  Review of Systems: As per HPI, rest all negative.   Past Medical History:  Diagnosis Date  . Anemia    low hgb at present  . Arthritis    FEET  . Blood transfusion without reported diagnosis 1974   kidney removed - L  . Cancer ([place])    skin ca right shoulder, plastic dsyplasia, pre-Ca polpys removed on Colonoscopy- 07/2014  . Colon polyps 07/23/2014   Tubular adenomas x 5  . Constipation   . Diabetes mellitus without complication (HCC)    Type 2  . Difficult intubation    unsure of actual problem but it was during the January 28, 2015 procedure.  Marland Kitchen Dysrhythmia   . Eczema   . ESRD (end stage renal disease) ([place])    hEMO mwf  . Headache(784.0)    slight dull h/a due kidney failure  . Hyperlipidemia   . Hypertension    SVT h/o , followed by Dr. Claiborne Billings  . Hypothyroidism   . Macular degeneration   . Neuropathy (HCC)    right hand and both feet  . PAT  (paroxysmal atrial tachycardia) ([place])   . PSVT (paroxysmal supraventricular tachycardia) ([place])   . SBO (small bowel obstruction)   . Shortness of breath    with exertion  . Sleep apnea 06/18/11   split-night sleep study- [facility]., BiPAP- 13-16  . Thyroid disease   . Umbilical hernia    will repair with thi surgery    Past Surgical History:  Procedure Laterality Date  . AV FISTULA PLACEMENT Right 02/25/2015   Procedure: RIGHT ARTERIOVENOUS (AV) FISTULA CREATION;  Surgeon: Serafina Mitchell, MD;  Location: [place];  Service: Vascular;  Laterality: Right;  . AV FISTULA PLACEMENT Right 09/15/2015   Procedure: ARTERIOVENOUS (AV) FISTULA CREATION- RIGHT ARM;  Surgeon: Serafina Mitchell, MD;  Location: [place];  Service: Vascular;  Laterality: Right;  . CAPD INSERTION N/A 05/18/2014   Procedure: LAPAROSCOPIC INSERTION CONTINUOUS AMBULATORY PERITONEAL DIALYSIS  (CAPD) CATHETER;  Surgeon: Ralene Ok, MD;  Location: [place];  Service: General;  Laterality: N/A;  . COLONOSCOPY    . COLONOSCOPY WITH PROPOFOL N/A 07/12/2016   Procedure: COLONOSCOPY WITH PROPOFOL;  Surgeon: [name], MD;  Location: [facility];  Service: Endoscopy;  Laterality: N/A;  . declotting of fistula  08/2015  . ESOPHAGOGASTRODUODENOSCOPY (EGD) WITH PROPOFOL N/A 07/12/2016   Procedure: ESOPHAGOGASTRODUODENOSCOPY (EGD) WITH PROPOFOL;  Surgeon: Quillian Quince  Merrily Brittle, MD;  Location: [facility];  Service: Endoscopy;  Laterality: N/A;  . FISTULA SUPERFICIALIZATION Right 02/07/2016   Procedure: RIIGHT UPPER ARM FISTULA SUPERFICIALIZATION;  Surgeon: Elam Dutch, MD;  Location: [place];  Service: Vascular;  Laterality: Right;  . IJ catheter insertion    . INSERTION OF DIALYSIS CATHETER Right 02/25/2015   Procedure: INSERTION OF RIGHT INTERNAL JUGULAR DIALYSIS CATHETER;  Surgeon: Serafina Mitchell, MD;  Location: [place];  Service: Vascular;  Laterality: Right;  . LAPAROSCOPIC REPOSITIONING CAPD CATHETER N/A 06/16/2014     Procedure: LAPAROSCOPIC REPOSITIONING CAPD CATHETER;  Surgeon: Ralene Ok, MD;  Location: [place];  Service: General;  Laterality: N/A;  . LAPAROSCOPY N/A 05/04/2015   Procedure: LAPAROSCOPY DIAGNOSTIC LYSIS OF ADHESIONS;  Surgeon: Coralie Keens, MD;  Location: [place];  Service: General;  Laterality: N/A;  . MINOR REMOVAL OF PERITONEAL DIALYSIS CATHETER N/A 04/28/2015   Procedure:  REMOVAL OF PERITONEAL DIALYSIS CATHETER;  Surgeon: Coralie Keens, MD;  Location: [place];  Service: General;  Laterality: N/A;  . NEPHRECTOMY Left 1974  . RENAL BIOPSY Right 2012  . REVISON OF ARTERIOVENOUS FISTULA Right 05/26/2015   Procedure: SUPERFICIALIZATION OF ARTERIOVENOUS FISTULA WITH SIDE BRANCH LIGATIONS;  Surgeon: Serafina Mitchell, MD;  Location: [place];  Service: Vascular;  Laterality: Right;  . SKIN CANCER EXCISION     right shoulder  . UMBILICAL HERNIA REPAIR N/A 05/18/2014   Procedure: HERNIA REPAIR UMBILICAL ADULT;  Surgeon: Ralene Ok, MD;  Location: [place];  Service: General;  Laterality: N/A;  . UMBILICAL HERNIA REPAIR N/A 04/28/2015   Procedure: UMBILICAL HERNIA REPAIR WITH MESH;  Surgeon: Coralie Keens, MD;  Location: [place];  Service: General;  Laterality: N/A;     reports that he quit smoking about 3 years ago. His smoking use included Cigars. He has never used smokeless tobacco. He reports that he drinks about 0.6 oz of alcohol per week . He reports that he does not use drugs.  No Known Allergies  Family History  Problem Relation Age of Onset  . Colon polyps Father   . Colon cancer Neg Hx   . Stomach cancer Neg Hx     Prior to Admission medications   Medication Sig Start Date End Date Taking? Authorizing Provider  calcitRIOL (ROCALTROL) 0.25 MCG capsule Take 0.75 mcg by mouth every Monday, Wednesday, and Friday. DURING DIALYSIS   Yes Historical Provider, MD  calcium acetate (PHOSLO) 667 MG capsule Take 667 mg by mouth 3 (three) times daily with meals.   Yes Historical  Provider, MD  cetirizine (ZYRTEC) 10 MG tablet Take 10 mg by mouth every morning.    Yes Historical Provider, MD  cyclobenzaprine (FLEXERIL) 5 MG tablet Take 5 mg by mouth 3 (three) times daily as needed for cramping. 06/27/16  Yes Historical Provider, MD  docusate sodium (COLACE) 100 MG capsule Take 100 mg by mouth daily.    Yes Historical Provider, MD  DULoxetine (CYMBALTA) 30 MG capsule Take 30 mg by mouth daily.  03/09/16  Yes Historical Provider, MD  ethyl chloride spray USE PRE DIALYSIS AS NEEDED 01/12/16  Yes Historical Provider, MD  fluocinonide cream (LIDEX) 0.35 % Apply 1 application topically daily as needed (Skin inflammation (hand, thigh, back)).    Yes Historical Provider, MD  FOSRENOL 1000 MG chewable tablet Chew 1,000-2,000 mg by mouth See admin instructions. 2000 mg with every meal and 1000 mg with each snack 12/29/14  Yes Historical Provider, MD  gabapentin (NEURONTIN) 100 MG capsule  Take 200 mg by mouth every morning.  06/02/16  Yes Historical Provider, MD  glucose blood (ONETOUCH VERIO) test strip Use as instructed to check blood sugar 6 a day dx code E11.29 Patient taking differently: Use as instructed to check blood sugar 6x a day dx code E11.29 08/04/14  Yes Elayne Snare, MD  HUMULIN R 500 UNIT/ML injection Using a U-100 1cc/31G/5/16" INSULIN SYRINGE, draw up and inject 5-18 units into the skin three times a day 30 minutes prior to each meal 03/07/16  Yes Historical Provider, MD  Insulin Pen Needle 31G X 5 MM MISC Use 4 per day to inject insulin. 12/19/15  Yes Elayne Snare, MD  Insulin Syringe-Needle U-100 (INSULIN SYRINGE 1CC/31GX5/16") 31G X 5/16" 1 ML MISC Use to inject insulin 08/12/14  Yes Elayne Snare, MD  levothyroxine (SYNTHROID, LEVOTHROID) 100 MCG tablet TAKE 1 TABLET (100 MCG TOTAL) BY MOUTH DAILY. 05/25/16  Yes Elayne Snare, MD  lidocaine-prilocaine (EMLA) cream APPLY SMALL AMOUNT TO DIALYSIS SITE 30 -60 MINUTES PRIOR TO DIALYSIS 3X A WEEK 12/16/15  Yes Historical Provider, MD   methocarbamol (ROBAXIN) 500 MG tablet Take 500 mg by mouth daily as needed for muscle spasms.    Yes Historical Provider, MD  polyethylene glycol (MIRALAX / GLYCOLAX) packet Take 17 g by mouth daily.   Yes Historical Provider, MD  rosuvastatin (CRESTOR) 10 MG tablet Take 1 tablet (10 mg total) by mouth daily. 03/27/16  Yes Elayne Snare, MD  senna (SENOKOT) 8.6 MG tablet Take 1 tablet by mouth daily.   Yes Historical Provider, MD  SENSIPAR 60 MG tablet Take 60 mg by mouth daily.  01/09/16  Yes Historical Provider, MD  traMADol (ULTRAM) 50 MG tablet Take 1 tablet (50 mg total) by mouth every 6 (six) hours as needed. Patient taking differently: Take 50-100 mg by mouth every 6 (six) hours as needed (for pain).  02/07/16  Yes Ulyses Amor, PA-C  traZODone (DESYREL) 50 MG tablet Take 50 mg by mouth See admin instructions. Every other night at bedtime for sleep 08/18/16  Yes Historical Provider, MD  TRESIBA FLEXTOUCH 100 UNIT/ML SOPN FlexTouch Pen INJECT 30 [place] MORNING Patient taking differently: INJECT 28 UNITS INTO THE SKIN EVERY MORNING 07/22/16  Yes Elayne Snare, MD  zolpidem (AMBIEN) 10 MG tablet Take 10 mg by mouth See admin instructions. Every other night at bedtime for sleep   Yes Historical Provider, MD  nebivolol (BYSTOLIC) 2.5 MG tablet Take 1 tablet on NON-DIALYSIS DAYS. Patient not taking: Reported on 09/14/2016 07/26/16   Troy Sine, MD    Physical Exam: Vitals:   09/15/16 0015 09/15/16 0030 09/15/16 0045 09/15/16 0118  BP: 156/81 165/71 140/62 (!) 145/78  Pulse: 87 86 88 92  Resp: 13 18 15 18   Temp:    98.8 F (37.1 C)  TempSrc:    Oral  SpO2: 97% 98% 96% 94%  Weight:    133.9 kg (295 lb 1.6 oz)  Height:    6\' 1"  (1.854 m)      Constitutional: Obese not in distress. Vitals:   09/15/16 0015 09/15/16 0030 09/15/16 0045 09/15/16 0118  BP: 156/81 165/71 140/62 (!) 145/78  Pulse: 87 86 88 92  Resp: 13 18 15 18   Temp:    98.8 F (37.1 C)  TempSrc:    Oral   SpO2: 97% 98% 96% 94%  Weight:    133.9 kg (295 lb 1.6 oz)  Height:    6\' 1"  (1.854 m)  Eyes: Anicteric no pallor. ENMT: No discharge from the ears eyes nose or mouth. Neck: No mass felt. No neck rigidity. No JVD appreciated. Respiratory: No rhonchi or crepitations. Cardiovascular: S1-S2 heard no murmurs appreciated. Abdomen: Soft nontender bowel sounds present. No guarding or rigidity. Musculoskeletal: No edema. Patient wears stockings. Skin: No rash. Skin appears warm. Neurologic: Alert awake oriented to time place and person. Moves all extremities. Psychiatric: Appears normal. Normal affect.   Labs on Admission: I have personally reviewed following labs and imaging studies  CBC:  Recent Labs Lab 09/14/16 2051  WBC 7.1  NEUTROABS 4.8  HGB 11.2*  HCT 33.2*  MCV 94.9  PLT 027   Basic Metabolic Panel:  Recent Labs Lab 09/14/16 2051  NA 135  K 3.2*  CL 94*  CO2 27  GLUCOSE 124*  BUN 25*  CREATININE 5.43*  CALCIUM 8.8*   GFR: Estimated Creatinine Clearance: 21.5 mL/min (by C-G formula based on SCr of 5.43 mg/dL (H)). Liver Function Tests: No results for input(s): AST, ALT, ALKPHOS, BILITOT, PROT, ALBUMIN in the last 168 hours. No results for input(s): LIPASE, AMYLASE in the last 168 hours. No results for input(s): AMMONIA in the last 168 hours. Coagulation Profile: No results for input(s): INR, PROTIME in the last 168 hours. Cardiac Enzymes: No results for input(s): CKTOTAL, CKMB, CKMBINDEX, TROPONINI in the last 168 hours. BNP (last 3 results) No results for input(s): PROBNP in the last 8760 hours. HbA1C: No results for input(s): HGBA1C in the last 72 hours. CBG:  Recent Labs Lab 09/15/16 0133  GLUCAP 129*   Lipid Profile: No results for input(s): CHOL, HDL, LDLCALC, TRIG, CHOLHDL, LDLDIRECT in the last 72 hours. Thyroid Function Tests:  Recent Labs  09/14/16 2207  TSH 1.108   Anemia Panel: No results for input(s): VITAMINB12, FOLATE,  FERRITIN, TIBC, IRON, RETICCTPCT in the last 72 hours. Urine analysis:    Component Value Date/Time   COLORURINE YELLOW 07/31/2014 1658   APPEARANCEUR HAZY (A) 07/31/2014 1658   LABSPEC 1.016 07/31/2014 1658   PHURINE 5.0 07/31/2014 1658   GLUCOSEU 250 (A) 07/31/2014 1658   GLUCOSEU 100 (A) 01/04/2014 0907   HGBUR TRACE (A) 07/31/2014 1658   BILIRUBINUR NEGATIVE 07/31/2014 1658   KETONESUR NEGATIVE 07/31/2014 1658   PROTEINUR >300 (A) 07/31/2014 1658   UROBILINOGEN 0.2 07/31/2014 1658   NITRITE NEGATIVE 07/31/2014 1658   LEUKOCYTESUR NEGATIVE 07/31/2014 1658   Sepsis Labs: @LABRCNTIP (procalcitonin:4,lacticidven:4) )No results found for this or any previous visit (from the past 240 hour(s)).   Radiological Exams on Admission: Dg Chest Portable 1 View  Result Date: 09/14/2016 CLINICAL DATA:  Cardiac a arrhythmias tonight. EXAM: PORTABLE CHEST 1 VIEW COMPARISON:  05/10/2015 FINDINGS: A single AP portable view of the chest demonstrates no focal airspace consolidation or alveolar edema. The lungs are grossly clear. Chronic blunting of the costophrenic angles, unchanged. There is no large effusion or pneumothorax. Cardiac and mediastinal contours appear unremarkable. IMPRESSION: No acute disease. Electronically Signed   By: Andreas Newport M.D.   On: 09/14/2016 21:24    EKG: Independently reviewed. SVT.  Assessment/Plan Principal Problem:   SVT (supraventricular tachycardia) (HCC) Active Problems:   Severe obstructive sleep apnea   Hypothyroidism   End stage renal disease on dialysis ([place])   HTN (hypertension)   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, with long-term current use of insulin ([place])    1. SVT - appreciate cardiology consult and recommendations. Patient has been placed on verapamil 80  mg by mouth 3 times a day. Closely monitor in telemetry. 2. ESRD on hemodialysis on Monday Wednesday and Friday - may discuss with  nephrologist in a.m. if patient may need another session on Saturday. 3. Diabetes mellitus type 2 - patient is on [name]. I have also discussed with pharmacist about dosing insulin Humulin 500 which pharmacy will be dosing 5 units with meals 3 times a day for now. Will be following CBGs closely. 4. Hypothyroidism on Synthroid - check TSH due to SVT. 5. Severe obstructive sleep apnea on BiPAP at bedtime. 6. Chronic anemia - follow CBC.   DVT prophylaxis: Heparin. Code Status: Full code.  Family Communication: Discussed with patient.  Disposition Plan: Home.  Consults called: Cardiology.  Admission status: Observation.    Rise Patience MD [facility] Pager [phone].  If 7PM-7AM, please contact night-coverage [email or web address] Password [email or web address]  09/15/2016, 2:10 AM

## 2016-09-15 NOTE — Progress Notes (Signed)
   09/15/16 0118  Vitals  Temp 98.8 F (37.1 C)  Temp Source Oral  BP (!) 145/78  BP Location Left Arm  BP Method Automatic  Patient Position (if appropriate) Sitting  Pulse Rate 92  Pulse Rate Source Dinamap  Resp 18  Oxygen Therapy  SpO2 94 %  O2 Device Room Air  Height and Weight  Height 6\' 1"  (1.854 m)  Weight 133.9 kg (295 lb 1.6 oz) (Scale B)  Type of Scale Used Standing  Type of Weight Actual  BSA (Calculated - sq m) 2.63 sq meters  BMI (Calculated) 39  Weight in (lb) to have BMI = 25 189.1  Admitted pt to rm 3E22 from ED, pt alert and oriented, denied pain at this time, oriented to room, call bell placed within reach, placed on cardiac monitor, CCMD made aware.

## 2016-09-16 DIAGNOSIS — I471 Supraventricular tachycardia: Secondary | ICD-10-CM | POA: Diagnosis not present

## 2016-09-16 LAB — GLUCOSE, CAPILLARY
GLUCOSE-CAPILLARY: 121 mg/dL — AB (ref 65–99)
Glucose-Capillary: 127 mg/dL — ABNORMAL HIGH (ref 65–99)

## 2016-09-16 MED ORDER — VERAPAMIL HCL ER 240 MG PO TBCR
240.0000 mg | EXTENDED_RELEASE_TABLET | Freq: Every day | ORAL | Status: DC
  Administered 2016-09-16: 240 mg via ORAL
  Filled 2016-09-16: qty 1

## 2016-09-16 MED ORDER — VERAPAMIL HCL ER 240 MG PO TBCR: 240.0000 mg | EXTENDED_RELEASE_TABLET | Freq: Every day | ORAL | 3 refills | Status: DC

## 2016-09-16 MED ORDER — VERAPAMIL HCL ER 240 MG PO TBCR: 240.0000 mg | EXTENDED_RELEASE_TABLET | Freq: Every day | ORAL | 0 refills | Status: DC

## 2016-09-16 NOTE — Progress Notes (Signed)
Patient alert and oriented, denies shortness of breath, pain decrease, see flowsheet.iv and tele d/c , all questions answered, d/c instruction explain and given to the patient. Patient d/c per order

## 2016-09-17 ENCOUNTER — Other Ambulatory Visit: Payer: Self-pay | Admitting: Endocrinology

## 2016-09-18 ENCOUNTER — Ambulatory Visit: Payer: Medicare Other | Admitting: Podiatry

## 2016-09-20 ENCOUNTER — Encounter: Payer: Self-pay | Admitting: Cardiology

## 2016-09-25 ENCOUNTER — Other Ambulatory Visit: Payer: Self-pay | Admitting: *Deleted

## 2016-09-25 ENCOUNTER — Telehealth: Payer: Self-pay | Admitting: Cardiology

## 2016-09-25 ENCOUNTER — Encounter: Payer: Self-pay | Admitting: Podiatry

## 2016-09-25 ENCOUNTER — Telehealth: Payer: Self-pay | Admitting: Cardiovascular Disease

## 2016-09-25 ENCOUNTER — Ambulatory Visit (INDEPENDENT_AMBULATORY_CARE_PROVIDER_SITE_OTHER): Payer: BLUE CROSS/BLUE SHIELD | Admitting: Podiatry

## 2016-09-25 VITALS — Ht 72.0 in | Wt 298.0 lb

## 2016-09-25 DIAGNOSIS — M79676 Pain in unspecified toe(s): Secondary | ICD-10-CM

## 2016-09-25 DIAGNOSIS — I471 Supraventricular tachycardia: Secondary | ICD-10-CM

## 2016-09-25 DIAGNOSIS — G4733 Obstructive sleep apnea (adult) (pediatric): Secondary | ICD-10-CM

## 2016-09-25 DIAGNOSIS — B351 Tinea unguium: Secondary | ICD-10-CM | POA: Diagnosis not present

## 2016-09-25 DIAGNOSIS — E1149 Type 2 diabetes mellitus with other diabetic neurological complication: Secondary | ICD-10-CM | POA: Diagnosis not present

## 2016-09-25 DIAGNOSIS — I10 Essential (primary) hypertension: Secondary | ICD-10-CM

## 2016-09-25 DIAGNOSIS — N186 End stage renal disease: Secondary | ICD-10-CM

## 2016-09-25 NOTE — Telephone Encounter (Signed)
Spoke with Robert [name] and patient needs updated Echo in January Patient has ov with Dr Curt Bears later this month  Will forward to Dr Camnitz/Dr Claiborne Billings to get ok for Echo.

## 2016-09-25 NOTE — Telephone Encounter (Signed)
Spoke with patient and was told it was his transplant coordinator that requested Echo before end of the year [phone] Bayard Hugger  Left message to call back

## 2016-09-25 NOTE — Progress Notes (Signed)
Patient ID: Robert [name], male   DOB: 10/22/1960, 55 y.o.   MRN: [id number] Complaint:  Visit Type: Patient returns to my office for continued preventative foot care services. Complaint: Patient states" my nails have grown long and thick and become painful to walk and wear shoes" Patient has been diagnosed with DM and is taking dialysis and neuropathy.  . The patient presents for preventative foot care services. No changes to ROS.  Patient says he has painful area on the bottom of his right foot and asks for it to be evaluated.  Patient says he is pleased with his shoes.  Podiatric Exam: Vascular: dorsalis pedis and posterior tibial pulses are palpable bilateral. Capillary return is immediate. Temperature gradient is WNL. Skin turgor WNL  Sensorium: Diminished  Semmes Weinstein monofilament test. Normal tactile sensation bilaterally. Nail Exam: Pt has thick disfigured discolored nails with subungual debris noted bilateral entire nail hallux through fifth toenails Ulcer Exam: There is no evidence of ulcer or pre-ulcerative changes or infection. Orthopedic Exam: Muscle tone and strength are WNL. No limitations in general ROM. No crepitus or effusions noted. Foot type and digits show no abnormalities. Bony prominences are unremarkable. HAV  B/L Skin: No Porokeratosis. No infection or ulcers.  Blackened callus lesion on bottom right foot under 4th metabase region.  No redness or swelling noted.  Diagnosis:  Onychomycosis, , Pain in right toe, pain in left toes, Diabetic neuropathy  Hallux limitus 1st MPJ  B/L  Porokeratosis right foot  Treatment & Plan Procedures and Treatment: Consent by patient was obtained for treatment procedures. The patient understood the discussion of treatment and procedures well. All questions were answered thoroughly reviewed. Debridement of mycotic and hypertrophic toenails, 1 through 5 bilateral and clearing of subungual debris. No ulceration, no infection noted. Initiate  diabetic shoes with peripheral neuropathy and hallux limitus. Return Visit-Office Procedure: Patient instructed to return to the office for a follow up visit 3 months for continued evaluation and treatment.    Gardiner Barefoot DPM

## 2016-09-25 NOTE — Telephone Encounter (Signed)
Explained that we did not have any openings on 29th for an echo.  Offered appt tomorrow at [facility] med center but pt states he will keep 12/27 echo appt. He appreciates my help.

## 2016-09-25 NOTE — Telephone Encounter (Signed)
New Message:   Pt is scheduled to see  Dr Curt Bears on 10-05-16,he wants to know if he can have an echo the same day. He says [facility] wants him to have one before the end of the year.

## 2016-09-25 NOTE — Telephone Encounter (Signed)
Patient is calling to get an echo schedule , but an order needs to be palced int he system . [facility] is wanting him to have a Echo before the end of the year .

## 2016-09-25 NOTE — Telephone Encounter (Signed)
Spoke with Rollene Fare and she will call Jeanine O. Transplant coordinator for order to be faxed

## 2016-09-28 NOTE — Telephone Encounter (Signed)
Ok for echo

## 2016-10-02 NOTE — Telephone Encounter (Signed)
Echo scheduled for 10/03/2016

## 2016-10-03 ENCOUNTER — Other Ambulatory Visit (HOSPITAL_COMMUNITY): Payer: Self-pay

## 2016-10-05 ENCOUNTER — Other Ambulatory Visit: Payer: Self-pay | Admitting: Cardiology

## 2016-10-05 ENCOUNTER — Encounter: Payer: Self-pay | Admitting: Cardiology

## 2016-10-05 ENCOUNTER — Encounter: Payer: Self-pay | Admitting: *Deleted

## 2016-10-05 ENCOUNTER — Ambulatory Visit (INDEPENDENT_AMBULATORY_CARE_PROVIDER_SITE_OTHER): Payer: BLUE CROSS/BLUE SHIELD | Admitting: Cardiology

## 2016-10-05 VITALS — BP 140/92 | HR 96 | Ht 71.0 in | Wt 293.2 lb

## 2016-10-05 DIAGNOSIS — Z01812 Encounter for preprocedural laboratory examination: Secondary | ICD-10-CM | POA: Diagnosis not present

## 2016-10-05 DIAGNOSIS — I471 Supraventricular tachycardia, unspecified: Secondary | ICD-10-CM

## 2016-10-05 NOTE — Patient Instructions (Addendum)
Medication Instructions:    Your physician recommends that you continue on your current medications as directed. Please refer to the Current Medication list given to you today.  --- If you need a refill on your cardiac medications before your next appointment, please call your pharmacy. ---  Labwork:  Your physician recommends that you return for lab work in: 1-2 weeks prior to your procedure on 11/22/16, for pre procedure lab work  (BMET, CBC w/ diff)  Testing/Procedures: Your physician has recommended that you have an ablation. Catheter ablation is a medical procedure used to treat some cardiac arrhythmias (irregular heartbeats). During catheter ablation, a long, thin, flexible tube is put into a blood vessel in your groin (upper thigh), or neck. This tube is called an ablation catheter. It is then guided to your heart through the blood vessel. Radio frequency waves destroy small areas of heart tissue where abnormal heartbeats may cause an arrhythmia to start. Please see the instruction sheet given to you today.  Follow-Up:  Your physician recommends that you schedule a follow-up appointment in: 4 weeks, after your procedure on 11/22/16, with Dr. Curt Bears  Thank you for choosing [facility]!!   Trinidad Curet, RN [phone]  Any Other Special Instructions Will Be Listed Below (If Applicable).  Cardiac Ablation Cardiac ablation is a procedure to disable a small amount of heart tissue in very specific places. The heart has many electrical connections. Sometimes these connections are abnormal and can cause the heart to beat very fast or irregularly. By disabling some of the problem areas, heart rhythm can be improved or made normal. Ablation is done for people who:   Have Wolff-Parkinson-White syndrome.   Have other fast heart rhythms (tachycardia).   Have taken medicines for an abnormal heart rhythm (arrhythmia) that resulted in:   No success.   Side effects.   May have a  high-risk heartbeat that could result in death.  LET [facility] CARE PROVIDER KNOW ABOUT:   Any allergies you have or any previous reactions you have had to X-ray dye, food (such as seafood), medicine, or tape.   All medicines you are taking, including vitamins, herbs, eye drops, creams, and over-the-counter medicines.   Previous problems you or members of your family have had with the use of anesthetics.   Any blood disorders you have.   Previous surgeries or procedures (such as a kidney transplant) you have had.   Medical conditions you have (such as kidney failure).  RISKS AND COMPLICATIONS Generally, cardiac ablation is a safe procedure. However, problems can occur and include:   Increased risk of cancer. Depending on how long it takes to do the ablation, the dose of radiation can be high.  Bruising and bleeding where a thin, flexible tube (catheter) was inserted during the procedure.   Bleeding into the chest, especially into the sac that surrounds the heart (serious).  Need for a permanent pacemaker if the normal electrical system is damaged.   The procedure may not be fully effective, and this may not be recognized for months. Repeat ablation procedures are sometimes required. BEFORE THE PROCEDURE   Follow any instructions from your health care provider regarding eating and drinking before the procedure.   Take your medicines as directed at regular times with water, unless instructed otherwise by your health care provider. If you are taking diabetes medicine, including insulin, ask how you are to take it and if there are any special instructions you should follow. It is common to adjust  insulin dosing the day of the ablation.  PROCEDURE  An ablation is usually performed in a catheterization laboratory with the guidance of fluoroscopy. Fluoroscopy is a type of X-ray that helps your health care provider see images of your heart during the procedure.   An ablation  is a minimally invasive procedure. This means a small cut (incision) is made in either your neck or groin. Your health care provider will decide where to make the incision based on your medical history and physical exam.  An IV tube will be started before the procedure begins. You will be given an anesthetic or medicine to help you relax (sedative).  The skin on your neck or groin will be numbed. A needle will be inserted into a large vein in your neck or groin and catheters will be threaded to your heart.  A special dye that shows up on fluoroscopy pictures may be injected through the catheter. The dye helps your health care provider see the area of the heart that needs treatment.  The catheter has electrodes on the tip. When the area of heart tissue that is causing the arrhythmia is found, the catheter tip will send an electrical current to the area and "scar" the tissue. Three types of energy can be used to ablate the heart tissue:   Heat (radiofrequency energy).   Laser energy.   Extreme cold (cryoablation).   When the area of the heart has been ablated, the catheter will be taken out. Pressure will be held on the insertion site. This will help the insertion site clot and keep it from bleeding. A bandage will be placed on the insertion site.  AFTER THE PROCEDURE   After the procedure, you will be taken to a recovery area where your vital signs (blood pressure, heart rate, and breathing) will be monitored. The insertion site will also be monitored for bleeding.   You will need to lie still for 4-6 hours. This is to ensure you do not bleed from the catheter insertion site.  This information is not intended to replace advice given to you by your health care provider. Make sure you discuss any questions you have with your health care provider. Document Released: 02/10/2009 Document Revised: 10/15/2014 Document Reviewed: 02/16/2013 Elsevier Interactive Patient Education  2017 [employer].

## 2016-10-05 NOTE — Progress Notes (Signed)
Electrophysiology Office Note   Date:  10/05/2016   ID:  Robert [name], DOB [birth year], MRN [id number]  PCP:  Haywood Pao, MD  Cardiologist:  Claiborne Billings Primary Electrophysiologist:  Sharlene Mccluskey Meredith Leeds, MD    Chief Complaint  Patient presents with  . New Patient (Initial Visit)    SVT     History of Present Illness: Robert [name] is a 55 y.o. male who presents today for electrophysiology evaluation.   History of ESRD on dialysis, diabetes type 2, hyperlipidemia, and chronic anemia who is brought to the emergency room after palpitations during dialysis. He was found to be in SVT and had vagal maneuvers done which aborted his SVT. In the emergency room he had 2-3 more recurrences of SVT which were all aborted with vagal maneuvers. He was started on verapamil and discharged from the hospital.   Today, he denies symptoms of palpitations, chest pain, shortness of breath, orthopnea, PND, lower extremity edema, claudication, dizziness, presyncope, syncope, bleeding, or neurologic sequela. The patient is tolerating medications without difficulties and is otherwise without complaint today.    Past Medical History:  Diagnosis Date  . Anemia    low hgb at present  . Arthritis    FEET  . Blood transfusion without reported diagnosis 1974   kidney removed - L  . Cancer ([place])    skin ca right shoulder, plastic dsyplasia, pre-Ca polpys removed on Colonoscopy- 07/2014  . Colon polyps 07/23/2014   Tubular adenomas x 5  . Constipation   . Diabetes mellitus without complication (HCC)    Type 2  . Difficult intubation    unsure of actual problem but it was during the January 28, 2015 procedure.  Marland Kitchen Dysrhythmia   . Eczema   . ESRD (end stage renal disease) ([place])    hEMO mwf  . Headache(784.0)    slight dull h/a due kidney failure  . Hyperlipidemia   . Hypertension    SVT h/o , followed by Dr. Claiborne Billings  . Hypothyroidism   . Macular degeneration   . Neuropathy (HCC)    right hand  and both feet  . PAT (paroxysmal atrial tachycardia) ([place])   . PSVT (paroxysmal supraventricular tachycardia) ([place])   . SBO (small bowel obstruction)   . Shortness of breath    with exertion  . Sleep apnea 06/18/11   split-night sleep study- [facility]., BiPAP- 13-16  . Thyroid disease   . Umbilical hernia    [name] repair with thi surgery   Past Surgical History:  Procedure Laterality Date  . AV FISTULA PLACEMENT Right 02/25/2015   Procedure: RIGHT ARTERIOVENOUS (AV) FISTULA CREATION;  Surgeon: Serafina Mitchell, MD;  Location: [place];  Service: Vascular;  Laterality: Right;  . AV FISTULA PLACEMENT Right 09/15/2015   Procedure: ARTERIOVENOUS (AV) FISTULA CREATION- RIGHT ARM;  Surgeon: Serafina Mitchell, MD;  Location: [place];  Service: Vascular;  Laterality: Right;  . CAPD INSERTION N/A 05/18/2014   Procedure: LAPAROSCOPIC INSERTION CONTINUOUS AMBULATORY PERITONEAL DIALYSIS  (CAPD) CATHETER;  Surgeon: Ralene Ok, MD;  Location: [place];  Service: General;  Laterality: N/A;  . COLONOSCOPY    . COLONOSCOPY WITH PROPOFOL N/A 07/12/2016   Procedure: COLONOSCOPY WITH PROPOFOL;  Surgeon: [name], MD;  Location: [facility];  Service: Endoscopy;  Laterality: N/A;  . declotting of fistula  08/2015  . ESOPHAGOGASTRODUODENOSCOPY (EGD) WITH PROPOFOL N/A 07/12/2016   Procedure: ESOPHAGOGASTRODUODENOSCOPY (EGD) WITH PROPOFOL;  Surgeon: [name], MD;  Location: [facility];  Service: Endoscopy;  Laterality: N/A;  . FISTULA SUPERFICIALIZATION Right 02/07/2016   Procedure: RIIGHT UPPER ARM FISTULA SUPERFICIALIZATION;  Surgeon: Elam Dutch, MD;  Location: [place];  Service: Vascular;  Laterality: Right;  . IJ catheter insertion    . INSERTION OF DIALYSIS CATHETER Right 02/25/2015   Procedure: INSERTION OF RIGHT INTERNAL JUGULAR DIALYSIS CATHETER;  Surgeon: Serafina Mitchell, MD;  Location: [place];  Service: Vascular;  Laterality: Right;  . LAPAROSCOPIC REPOSITIONING CAPD  CATHETER N/A 06/16/2014   Procedure: LAPAROSCOPIC REPOSITIONING CAPD CATHETER;  Surgeon: Ralene Ok, MD;  Location: [place];  Service: General;  Laterality: N/A;  . LAPAROSCOPY N/A 05/04/2015   Procedure: LAPAROSCOPY DIAGNOSTIC LYSIS OF ADHESIONS;  Surgeon: Coralie Keens, MD;  Location: [place];  Service: General;  Laterality: N/A;  . MINOR REMOVAL OF PERITONEAL DIALYSIS CATHETER N/A 04/28/2015   Procedure:  REMOVAL OF PERITONEAL DIALYSIS CATHETER;  Surgeon: Coralie Keens, MD;  Location: [place];  Service: General;  Laterality: N/A;  . NEPHRECTOMY Left 1974  . RENAL BIOPSY Right 2012  . REVISON OF ARTERIOVENOUS FISTULA Right 05/26/2015   Procedure: SUPERFICIALIZATION OF ARTERIOVENOUS FISTULA WITH SIDE BRANCH LIGATIONS;  Surgeon: Serafina Mitchell, MD;  Location: [place];  Service: Vascular;  Laterality: Right;  . SKIN CANCER EXCISION     right shoulder  . UMBILICAL HERNIA REPAIR N/A 05/18/2014   Procedure: HERNIA REPAIR UMBILICAL ADULT;  Surgeon: Ralene Ok, MD;  Location: [place];  Service: General;  Laterality: N/A;  . UMBILICAL HERNIA REPAIR N/A 04/28/2015   Procedure: UMBILICAL HERNIA REPAIR WITH MESH;  Surgeon: Coralie Keens, MD;  Location: [place];  Service: General;  Laterality: N/A;     Current Outpatient Prescriptions  Medication Sig Dispense Refill  . calcitRIOL (ROCALTROL) 0.25 MCG capsule Take 0.75 mcg by mouth every Monday, Wednesday, and Friday. DURING DIALYSIS    . calcium acetate (PHOSLO) 667 MG capsule Take 667 mg by mouth 3 (three) times daily with meals.    . cetirizine (ZYRTEC) 10 MG tablet Take 10 mg by mouth every morning.     . docusate sodium (COLACE) 100 MG capsule Take 100 mg by mouth daily.     . DULoxetine (CYMBALTA) 30 MG capsule Take 30 mg by mouth daily.     Marland Kitchen ethyl chloride spray USE PRE DIALYSIS AS NEEDED  10  . fluocinonide cream (LIDEX) 7.62 % Apply 1 application topically daily as needed (Skin inflammation (hand, thigh, back)).     [name] 1000 MG  chewable tablet Chew 1,000-2,000 mg by mouth See admin instructions. 2000 mg with every meal and 1000 mg with each snack    . gabapentin (NEURONTIN) 100 MG capsule Take 200 mg by mouth every morning.     Marland Kitchen glucose blood (ONETOUCH VERIO) test strip Use as instructed to check blood sugar 6 a day dx code E11.29 (Patient taking differently: Use as instructed to check blood sugar 6x a day dx code E11.29) 550 each 3  . HUMULIN R 500 UNIT/ML injection Using a U-100 1cc/31G/5/16" INSULIN SYRINGE, draw up and inject 5-18 units into the skin three times a day 30 minutes prior to each meal  1  . Insulin Pen Needle 31G X 5 MM MISC Use 4 per day to inject insulin. 400 each 1  . Insulin Syringe-Needle U-100 (INSULIN SYRINGE 1CC/31GX5/16") 31G X 5/16" 1 ML MISC Use to inject insulin 100 each 3  . levothyroxine (SYNTHROID, LEVOTHROID) 100 MCG tablet TAKE 1 TABLET (100  MCG TOTAL) BY MOUTH DAILY. 90 tablet 1  . lidocaine-prilocaine (EMLA) cream APPLY SMALL AMOUNT TO DIALYSIS SITE 30 -60 MINUTES PRIOR TO DIALYSIS 3X A WEEK  4  . methocarbamol (ROBAXIN) 500 MG tablet Take 500 mg by mouth daily as needed for muscle spasms.     . polyethylene glycol (MIRALAX / GLYCOLAX) packet Take 17 g by mouth daily.    . rosuvastatin (CRESTOR) 10 MG tablet TAKE 1 TABLET BY MOUTH EVERY DAY 90 tablet 1  . senna (SENOKOT) 8.6 MG tablet Take 1 tablet by mouth daily.    . SENSIPAR 60 MG tablet Take 60 mg by mouth daily.     . traMADol (ULTRAM) 50 MG tablet Take 1 tablet (50 mg total) by mouth every 6 (six) hours as needed. (Patient taking differently: Take 50-100 mg by mouth every 6 (six) hours as needed (for pain). ) 15 tablet 0  . traZODone (DESYREL) 50 MG tablet Take 50 mg by mouth See admin instructions. Every other night at bedtime for sleep    . TRESIBA FLEXTOUCH 100 UNIT/ML SOPN FlexTouch Pen INJECT 30 UNITS INTO THE SKIN EVERY MORNING (Patient taking differently: inject 28 units into skin every morning) 5 pen 1  . verapamil  (CALAN-SR) 240 MG CR tablet Take 1 tablet (240 mg total) by mouth daily. 30 tablet 3  . zolpidem (AMBIEN) 10 MG tablet Take 10 mg by mouth See admin instructions. Every other night at bedtime for sleep     No current facility-administered medications for this visit.     Allergies:   Patient has no known allergies.   Social History:  The patient  reports that he quit smoking about 3 years ago. His smoking use included Cigars. He has never used smokeless tobacco. He reports that he drinks about 0.6 oz of alcohol per week . He reports that he does not use drugs.   Family History:  The patient's family history includes Colon polyps in his father.    ROS:  Please see the history of present illness.   Otherwise, review of systems is positive for palpitations.   All other systems are reviewed and negative.    PHYSICAL EXAM: VS:  BP (!) 140/92   Pulse 96   Ht 5\' 11"  (1.803 m)   Wt 293 lb 3.2 oz (133 kg)   BMI 40.89 kg/m  , BMI Body mass index is 40.89 kg/m. GEN: Well nourished, well developed, in no acute distress  HEENT: normal  Neck: no JVD, carotid bruits, or masses Cardiac: RRR; no murmurs, rubs, or gallops,no edema  Respiratory:  clear to auscultation bilaterally, normal work of breathing GI: soft, nontender, nondistended, + BS MS: no deformity or atrophy  Skin: warm and dry Neuro:  Strength and sensation are intact Psych: euthymic mood, full affect  EKG:  EKG is not ordered today. Personal review of the ekg ordered 09/14/16 shows sinus rhythm, rate 88, PVCs  SVT with rate 191  Recent Labs: 03/15/2016: ALT 16 09/15/2016: BUN 32; Creatinine, Ser 7.04; Hemoglobin 10.7; Platelets 181; Potassium 3.8; Sodium 138; TSH 1.254    Lipid Panel     Component Value Date/Time   CHOL 147 06/14/2016 0903   TRIG 119.0 06/14/2016 0903   HDL 36.70 (L) 06/14/2016 0903   CHOLHDL 4 06/14/2016 0903   VLDL 23.8 06/14/2016 0903   LDLCALC 87 06/14/2016 0903     Wt Readings from Last 3  Encounters:  10/05/16 293 lb 3.2 oz (133 kg)  09/25/16 298  lb (135.2 kg)  09/16/16 298 lb 8 oz (135.4 kg)      Other studies Reviewed: Additional studies/ records that were reviewed today include: Stress TTE 10/20/15  Review of the above records today demonstrates:  - Stress ECG conclusions: There were no stress arrhythmias or   conduction abnormalities. 1 mm ST segment depression with   inverted [place] in recovery. - Staged echo: There was no echocardiographic evidence for   stress-induced ischemia. - Impressions: [place] study with normal echo images and positive   ECG in recovery Consider f/u cardiac CT or other imaging  Impressions:  - [place] study with normal echo images and positive ECG in   recovery Consider f/u cardiac CT or other imaging   ASSESSMENT AND PLAN:  1.  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 Discussed treatment options including ablation versus continued medical management. Risks and benefits of ablation were discussed. Risks include bleeding, tamponade, heart block, and stroke, among others. He understands these risks and has agreed to ablation. We'll continue his verapamil in the interim.  2. End-stage renal disease: Dialysis Monday Wednesday Friday. We'll work to schedule ablation around the time of his dialysis.  3. Hypertension: Mildly elevated but overall well controlled. No medical changes at this time.  4. Obstructive sleep apnea: On BiPAP. Encouraged weight loss.  Current medicines are reviewed at length with the patient today.   The patient does not have concerns regarding his medicines.  The following changes were made today:  none  Labs/ tests ordered today include:  No orders of the defined types were placed in this encounter.    Disposition:   FU with Imani Fiebelkorn 3 months  Signed, Tabitha Tupper Meredith Leeds, MD  10/05/2016 10:15 AM     [facility] [address] [place] [address] [phone] (office) [phone] (fax)

## 2016-10-05 NOTE — Addendum Note (Signed)
Addended by: Stanton Kidney on: 10/05/2016 10:43 AM   Modules accepted: Orders

## 2016-10-18 ENCOUNTER — Other Ambulatory Visit (HOSPITAL_COMMUNITY): Payer: Self-pay

## 2016-10-23 ENCOUNTER — Ambulatory Visit (HOSPITAL_COMMUNITY): Payer: BLUE CROSS/BLUE SHIELD | Attending: Cardiology

## 2016-10-23 ENCOUNTER — Other Ambulatory Visit: Payer: Self-pay

## 2016-10-23 DIAGNOSIS — G4733 Obstructive sleep apnea (adult) (pediatric): Secondary | ICD-10-CM

## 2016-10-23 DIAGNOSIS — N186 End stage renal disease: Secondary | ICD-10-CM

## 2016-10-23 DIAGNOSIS — E785 Hyperlipidemia, unspecified: Secondary | ICD-10-CM | POA: Diagnosis not present

## 2016-10-23 DIAGNOSIS — I1311 Hypertensive heart and chronic kidney disease without heart failure, with stage 5 chronic kidney disease, or end stage renal disease: Secondary | ICD-10-CM | POA: Insufficient documentation

## 2016-10-23 DIAGNOSIS — I059 Rheumatic mitral valve disease, unspecified: Secondary | ICD-10-CM | POA: Insufficient documentation

## 2016-10-23 DIAGNOSIS — E1122 Type 2 diabetes mellitus with diabetic chronic kidney disease: Secondary | ICD-10-CM | POA: Diagnosis not present

## 2016-10-23 DIAGNOSIS — I77810 Thoracic aortic ectasia: Secondary | ICD-10-CM | POA: Insufficient documentation

## 2016-10-23 DIAGNOSIS — I10 Essential (primary) hypertension: Secondary | ICD-10-CM | POA: Diagnosis not present

## 2016-10-23 DIAGNOSIS — I351 Nonrheumatic aortic (valve) insufficiency: Secondary | ICD-10-CM | POA: Diagnosis not present

## 2016-10-23 DIAGNOSIS — Z87891 Personal history of nicotine dependence: Secondary | ICD-10-CM | POA: Insufficient documentation

## 2016-10-26 ENCOUNTER — Telehealth: Payer: Self-pay | Admitting: Cardiovascular Disease

## 2016-10-26 NOTE — Telephone Encounter (Signed)
Returned a call to patient giving him his echo results per Dr Percival Spanish. Copy left at front desk for patient to pick up on Monday to carry with him to his [place] appointment. ( for the record the patient was not upset when I spoke with him.)

## 2016-10-26 NOTE — Telephone Encounter (Signed)
Pt calling, very upset,he wants his echo results. He needs them asap,going to [facility] Tuesday for kidney transplant assessment.

## 2016-10-29 ENCOUNTER — Telehealth: Payer: Self-pay | Admitting: Cardiology

## 2016-10-29 NOTE — Telephone Encounter (Signed)
New message    Looking to see if you scheduled his Ablation and it needs to be on Tuesday or Thursday, if you schedule if Monday , Weds, or Friday he needs to have dialysis ordered at hospital

## 2016-10-29 NOTE — Telephone Encounter (Signed)
LM for pt I will forward to Sherri to follow up with him tomorrow.

## 2016-10-30 ENCOUNTER — Encounter: Payer: Self-pay | Admitting: *Deleted

## 2016-10-30 DIAGNOSIS — Z7682 Awaiting organ transplant status: Secondary | ICD-10-CM | POA: Diagnosis not present

## 2016-10-30 DIAGNOSIS — Z008 Encounter for other general examination: Secondary | ICD-10-CM | POA: Diagnosis not present

## 2016-10-30 NOTE — Telephone Encounter (Signed)
F/u message ° °Pt returning RN call. Please call back to discuss  °

## 2016-10-30 NOTE — Telephone Encounter (Signed)
Informed procedure rescheduled to 11/23/16 secondary to hospital scheduling changes. H&P OV + Pre procedure labs 2/6. Post ablation f/u on 3/22. Updated letter of instructions sent through [facility]. Pt understands he will get dialysis before he leaves hospital.

## 2016-10-31 ENCOUNTER — Telehealth: Payer: Self-pay | Admitting: Cardiology

## 2016-10-31 NOTE — Telephone Encounter (Signed)
New message   Pt verbalized that [facility] center needs pt to have a formal update in pt going to be admitted in to [facility] for his cathlab and that his dialysis will be handled through [facility]

## 2016-10-31 NOTE — Telephone Encounter (Signed)
Patient calling to let us know that we need to contact [facility] - 1- They need to know that the patient will be admitted and not be observation. 2 - They need verification from our office that patient is going to receive dialysis at the hospital. Patient stated this date needs to be set and no rescheduling if at all possible due to circumstances. Will forward to Dr. Curt Bears and Venida Jarvis his nurse.

## 2016-11-01 NOTE — Telephone Encounter (Addendum)
Informed nurse at dialysis center that pt will have dialysis before going home, after his procedure on 2/16. Informed pt that I called [facility].  He thanks me for helping.

## 2016-11-07 DIAGNOSIS — Z992 Dependence on renal dialysis: Secondary | ICD-10-CM | POA: Diagnosis not present

## 2016-11-07 DIAGNOSIS — N186 End stage renal disease: Secondary | ICD-10-CM | POA: Diagnosis not present

## 2016-11-07 DIAGNOSIS — E1129 Type 2 diabetes mellitus with other diabetic kidney complication: Secondary | ICD-10-CM | POA: Diagnosis not present

## 2016-11-09 ENCOUNTER — Telehealth: Payer: Self-pay | Admitting: Cardiology

## 2016-11-09 NOTE — Telephone Encounter (Signed)
Close encounter 

## 2016-11-13 ENCOUNTER — Encounter: Payer: Self-pay | Admitting: Cardiology

## 2016-11-13 ENCOUNTER — Other Ambulatory Visit (INDEPENDENT_AMBULATORY_CARE_PROVIDER_SITE_OTHER): Payer: BLUE CROSS/BLUE SHIELD

## 2016-11-13 ENCOUNTER — Other Ambulatory Visit: Payer: BLUE CROSS/BLUE SHIELD | Admitting: *Deleted

## 2016-11-13 ENCOUNTER — Encounter: Payer: Self-pay | Admitting: *Deleted

## 2016-11-13 ENCOUNTER — Ambulatory Visit (INDEPENDENT_AMBULATORY_CARE_PROVIDER_SITE_OTHER): Payer: BLUE CROSS/BLUE SHIELD | Admitting: Cardiology

## 2016-11-13 VITALS — BP 112/68 | HR 80 | Ht 72.0 in | Wt 298.0 lb

## 2016-11-13 DIAGNOSIS — Z794 Long term (current) use of insulin: Secondary | ICD-10-CM

## 2016-11-13 DIAGNOSIS — E782 Mixed hyperlipidemia: Secondary | ICD-10-CM

## 2016-11-13 DIAGNOSIS — I471 Supraventricular tachycardia, unspecified: Secondary | ICD-10-CM

## 2016-11-13 DIAGNOSIS — I10 Essential (primary) hypertension: Secondary | ICD-10-CM

## 2016-11-13 DIAGNOSIS — E038 Other specified hypothyroidism: Secondary | ICD-10-CM | POA: Diagnosis not present

## 2016-11-13 DIAGNOSIS — E1165 Type 2 diabetes mellitus with hyperglycemia: Secondary | ICD-10-CM | POA: Diagnosis not present

## 2016-11-13 DIAGNOSIS — E785 Hyperlipidemia, unspecified: Secondary | ICD-10-CM

## 2016-11-13 DIAGNOSIS — Z01812 Encounter for preprocedural laboratory examination: Secondary | ICD-10-CM

## 2016-11-13 DIAGNOSIS — E063 Autoimmune thyroiditis: Secondary | ICD-10-CM

## 2016-11-13 LAB — BASIC METABOLIC PANEL
BUN / CREAT RATIO: 6 — AB (ref 9–20)
BUN: 49 mg/dL — ABNORMAL HIGH (ref 6–24)
CO2: 22 mmol/L (ref 18–29)
CREATININE: 8.36 mg/dL — AB (ref 0.76–1.27)
Calcium: 8.4 mg/dL — ABNORMAL LOW (ref 8.7–10.2)
Chloride: 95 mmol/L — ABNORMAL LOW (ref 96–106)
GFR calc Af Amer: 7 mL/min/{1.73_m2} — ABNORMAL LOW (ref >59)
GFR calc non Af Amer: 6 mL/min/{1.73_m2} — ABNORMAL LOW (ref >59)
GLUCOSE: 99 mg/dL (ref 65–99)
POTASSIUM: 4.5 mmol/L (ref 3.5–5.2)
SODIUM: 140 mmol/L (ref 134–144)

## 2016-11-13 LAB — CBC WITH DIFFERENTIAL/PLATELET
BASOS: 1 %
Basophils Absolute: 0 10*3/uL (ref 0.0–0.2)
EOS (ABSOLUTE): 0.2 10*3/uL (ref 0.0–0.4)
EOS: 3 %
HEMATOCRIT: 31.7 % — AB (ref 37.5–51.0)
Hemoglobin: 10.7 g/dL — ABNORMAL LOW (ref 13.0–17.7)
IMMATURE GRANULOCYTES: 1 %
Immature Grans (Abs): 0.1 10*3/uL (ref 0.0–0.1)
Lymphocytes Absolute: 0.9 10*3/uL (ref 0.7–3.1)
Lymphs: 15 %
MCH: 31.5 pg (ref 26.6–33.0)
MCHC: 33.8 g/dL (ref 31.5–35.7)
MCV: 93 fL (ref 79–97)
MONOS ABS: 0.9 10*3/uL (ref 0.1–0.9)
Monocytes: 14 %
NEUTROS PCT: 66 %
Neutrophils Absolute: 4.1 10*3/uL (ref 1.4–7.0)
PLATELETS: 205 10*3/uL (ref 150–379)
RBC: 3.4 x10E6/uL — AB (ref 4.14–5.80)
RDW: 16.1 % — AB (ref 12.3–15.4)
WBC: 6.1 10*3/uL (ref 3.4–10.8)

## 2016-11-13 LAB — LIPID PANEL
CHOL/HDL RATIO: 3
Cholesterol: 130 mg/dL (ref 0–200)
HDL: 41.4 mg/dL (ref >39.00)
LDL CALC: 68 mg/dL (ref 0–99)
NonHDL: 88.68
Triglycerides: 102 mg/dL (ref 0.0–149.0)
VLDL: 20.4 mg/dL (ref 0.0–40.0)

## 2016-11-13 LAB — GLUCOSE, RANDOM: GLUCOSE: 108 mg/dL — AB (ref 70–99)

## 2016-11-13 LAB — HEMOGLOBIN A1C: Hgb A1c MFr Bld: 5.8 % (ref 4.6–6.5)

## 2016-11-13 LAB — TSH: TSH: 1.11 u[IU]/mL (ref 0.35–4.50)

## 2016-11-13 LAB — ALT: ALT: 13 U/L (ref 0–53)

## 2016-11-13 NOTE — Patient Instructions (Addendum)
Medication Instructions:    Your physician recommends that you continue on your current medications as directed. Please refer to the Current Medication list given to you today.  --- If you need a refill on your cardiac medications before your next appointment, please call your pharmacy. ---  Labwork:  Pre procedure labs today: BMET & CBCD  Testing/Procedures:  You are scheduled for SVT ablation on 11/23/16.  Follow-Up:  Keep your post ablation follow up with Dr. Curt Bears on 12/27/16 @ 3:45 pm  Thank you for choosing [facility]!!   Trinidad Curet, RN [phone]

## 2016-11-13 NOTE — Progress Notes (Signed)
Electrophysiology Office Note   Date:  11/13/2016   ID:  Robert [name], DOB 12-Mar-1961, MRN [id number]  PCP:  Haywood Pao, MD  Cardiologist:  Claiborne Billings Primary Electrophysiologist:  Takeya Marquis Meredith Leeds, MD    Chief Complaint  Patient presents with  . Follow-up    SVT/H&P and pre procedure labs     History of Present Illness: Robert [name] is a 56 y.o. male who presents today for electrophysiology evaluation.   History of ESRD on dialysis, diabetes type 2, hyperlipidemia, and chronic anemia who is brought to the emergency room after palpitations during dialysis. He was found to be in SVT and had vagal maneuvers done which aborted his SVT. In the emergency room he had 2-3 more recurrences of SVT which were all aborted with vagal maneuvers. He was started on verapamil and discharged from the hospital. Plan for ablation 11/16/16.   Today, he denies symptoms of palpitations, chest pain, shortness of breath, orthopnea, PND, lower extremity edema, claudication, dizziness, presyncope, syncope, bleeding, or neurologic sequela. The patient is tolerating medications without difficulties and is otherwise without complaint today.    Past Medical History:  Diagnosis Date  . Anemia    low hgb at present  . Arthritis    FEET  . Blood transfusion without reported diagnosis 1974   kidney removed - L  . Cancer ([place])    skin ca right shoulder, plastic dsyplasia, pre-Ca polpys removed on Colonoscopy- 07/2014  . Colon polyps 07/23/2014   Tubular adenomas x 5  . Constipation   . Diabetes mellitus without complication (HCC)    Type 2  . Difficult intubation    unsure of actual problem but it was during the January 28, 2015 procedure.  Marland Kitchen Dysrhythmia   . Eczema   . ESRD (end stage renal disease) ([place])    hEMO mwf  . Headache(784.0)    slight dull h/a due kidney failure  . Hyperlipidemia   . Hypertension    SVT h/o , followed by Dr. Claiborne Billings  . Hypothyroidism   . Macular degeneration   .  Neuropathy (HCC)    right hand and both feet  . PAT (paroxysmal atrial tachycardia) ([place])   . PSVT (paroxysmal supraventricular tachycardia) ([place])   . SBO (small bowel obstruction)   . Shortness of breath    with exertion  . Sleep apnea 06/18/11   split-night sleep study- [facility]., BiPAP- 13-16  . Thyroid disease   . Umbilical hernia    Robert [name] repair with thi surgery   Past Surgical History:  Procedure Laterality Date  . AV FISTULA PLACEMENT Right 02/25/2015   Procedure: RIGHT ARTERIOVENOUS (AV) FISTULA CREATION;  Surgeon: Serafina Mitchell, MD;  Location: [facility];  Service: Vascular;  Laterality: Right;  . AV FISTULA PLACEMENT Right 09/15/2015   Procedure: ARTERIOVENOUS (AV) FISTULA CREATION- RIGHT ARM;  Surgeon: Serafina Mitchell, MD;  Location: [facility];  Service: Vascular;  Laterality: Right;  . CAPD INSERTION N/A 05/18/2014   Procedure: LAPAROSCOPIC INSERTION CONTINUOUS AMBULATORY PERITONEAL DIALYSIS  (CAPD) CATHETER;  Surgeon: Ralene Ok, MD;  Location: [facility];  Service: General;  Laterality: N/A;  . COLONOSCOPY    . COLONOSCOPY WITH PROPOFOL N/A 07/12/2016   Procedure: COLONOSCOPY WITH PROPOFOL;  Surgeon: [name], MD;  Location: [facility];  Service: Endoscopy;  Laterality: N/A;  . declotting of fistula  08/2015  . ESOPHAGOGASTRODUODENOSCOPY (EGD) WITH PROPOFOL N/A 07/12/2016   Procedure: ESOPHAGOGASTRODUODENOSCOPY (EGD) WITH PROPOFOL;  Surgeon:  [name], MD;  Location: [facility];  Service: Endoscopy;  Laterality: N/A;  . FISTULA SUPERFICIALIZATION Right 02/07/2016   Procedure: RIIGHT UPPER ARM FISTULA SUPERFICIALIZATION;  Surgeon: Elam Dutch, MD;  Location: [facility];  Service: Vascular;  Laterality: Right;  . IJ catheter insertion    . INSERTION OF DIALYSIS CATHETER Right 02/25/2015   Procedure: INSERTION OF RIGHT INTERNAL JUGULAR DIALYSIS CATHETER;  Surgeon: Serafina Mitchell, MD;  Location: [facility];  Service: Vascular;  Laterality: Right;  .  LAPAROSCOPIC REPOSITIONING CAPD CATHETER N/A 06/16/2014   Procedure: LAPAROSCOPIC REPOSITIONING CAPD CATHETER;  Surgeon: Ralene Ok, MD;  Location: [facility];  Service: General;  Laterality: N/A;  . LAPAROSCOPY N/A 05/04/2015   Procedure: LAPAROSCOPY DIAGNOSTIC LYSIS OF ADHESIONS;  Surgeon: Coralie Keens, MD;  Location: [facility];  Service: General;  Laterality: N/A;  . MINOR REMOVAL OF PERITONEAL DIALYSIS CATHETER N/A 04/28/2015   Procedure:  REMOVAL OF PERITONEAL DIALYSIS CATHETER;  Surgeon: Coralie Keens, MD;  Location: [facility];  Service: General;  Laterality: N/A;  . NEPHRECTOMY Left 1974  . RENAL BIOPSY Right 2012  . REVISON OF ARTERIOVENOUS FISTULA Right 05/26/2015   Procedure: SUPERFICIALIZATION OF ARTERIOVENOUS FISTULA WITH SIDE BRANCH LIGATIONS;  Surgeon: Serafina Mitchell, MD;  Location: [facility];  Service: Vascular;  Laterality: Right;  . SKIN CANCER EXCISION     right shoulder  . UMBILICAL HERNIA REPAIR N/A 05/18/2014   Procedure: HERNIA REPAIR UMBILICAL ADULT;  Surgeon: Ralene Ok, MD;  Location: [facility];  Service: General;  Laterality: N/A;  . UMBILICAL HERNIA REPAIR N/A 04/28/2015   Procedure: UMBILICAL HERNIA REPAIR WITH MESH;  Surgeon: Coralie Keens, MD;  Location: [facility];  Service: General;  Laterality: N/A;     Current Outpatient Prescriptions  Medication Sig Dispense Refill  . calcitRIOL (ROCALTROL) 0.25 MCG capsule Take 0.75 mcg by mouth every Monday, Wednesday, and Friday. DURING DIALYSIS    . calcium acetate (PHOSLO) 667 MG capsule Take 667 mg by mouth 3 (three) times daily with meals.    . cetirizine (ZYRTEC) 10 MG tablet Take 10 mg by mouth every morning.     . docusate sodium (COLACE) 100 MG capsule Take 100 mg by mouth daily.     . DULoxetine (CYMBALTA) 30 MG capsule Take 30 mg by mouth daily.     Marland Kitchen ethyl chloride spray USE PRE DIALYSIS AS NEEDED  10  . fluocinonide cream (LIDEX) 4.62 % Apply 1 application topically daily as needed (Skin inflammation (hand, thigh,  back)).     Robert [name] 1000 MG chewable tablet Chew 1,000-2,000 mg by mouth See admin instructions. 2000 mg with every meal and 1000 mg with each snack    . gabapentin (NEURONTIN) 100 MG capsule Take 200 mg by mouth every morning.     Marland Kitchen HUMULIN R 500 UNIT/ML injection Using a U-100 1cc/31G/5/16" INSULIN SYRINGE, draw up and inject 5-18 units into the skin three times a day 30 minutes prior to each meal  1  . levothyroxine (SYNTHROID, LEVOTHROID) 100 MCG tablet TAKE 1 TABLET (100 MCG TOTAL) BY MOUTH DAILY. 90 tablet 1  . lidocaine-prilocaine (EMLA) cream APPLY SMALL AMOUNT TO DIALYSIS SITE 30 -60 MINUTES PRIOR TO DIALYSIS 3X A WEEK  4  . polyethylene glycol (MIRALAX / GLYCOLAX) packet Take 17 g by mouth daily.    . rosuvastatin (CRESTOR) 10 MG tablet TAKE 1 TABLET BY MOUTH EVERY DAY 90 tablet 1  . senna (SENOKOT) 8.6 MG tablet Take 1 tablet by mouth daily.    Marland Kitchen  SENSIPAR 60 MG tablet Take 60 mg by mouth daily.     . traMADol (ULTRAM) 50 MG tablet Take 1 tablet (50 mg total) by mouth every 6 (six) hours as needed. (Patient taking differently: Take 50-100 mg by mouth every 6 (six) hours as needed (for pain). ) 15 tablet 0  . traZODone (DESYREL) 50 MG tablet Take 50 mg by mouth See admin instructions. Every other night at bedtime for sleep    . TRESIBA FLEXTOUCH 100 UNIT/ML SOPN FlexTouch Pen INJECT 30 UNITS INTO THE SKIN EVERY MORNING (Patient taking differently: inject 28 units into skin every morning) 5 pen 1  . verapamil (CALAN-SR) 240 MG CR tablet Take 1 tablet (240 mg total) by mouth daily. 30 tablet 3  . zolpidem (AMBIEN) 10 MG tablet Take 10 mg by mouth See admin instructions. Every other night at bedtime for sleep     No current facility-administered medications for this visit.     Allergies:   Patient has no known allergies.   Social History:  The patient  reports that he quit smoking about 3 years ago. His smoking use included Cigars. He has never used smokeless tobacco. He reports that he  drinks about 0.6 oz of alcohol per week . He reports that he does not use drugs.   Family History:  The patient's family history includes Colon polyps in his father.    ROS:  Please see the history of present illness.   Otherwise, review of systems is positive for palpitations, leg pain, snoring.   All other systems are reviewed and negative.    PHYSICAL EXAM: VS:  BP 112/68   Pulse 80   Ht 6' (1.829 m)   Wt 298 lb (135.2 kg)   BMI 40.42 kg/m  , BMI Body mass index is 40.42 kg/m. GEN: Well nourished, well developed, in no acute distress  HEENT: normal  Neck: no JVD, carotid bruits, or masses Cardiac: RRR; no murmurs, rubs, or gallops,no edema  Respiratory:  clear to auscultation bilaterally, normal work of breathing GI: soft, nontender, nondistended, + BS MS: no deformity or atrophy  Skin: warm and dry Neuro:  Strength and sensation are intact Psych: euthymic mood, full affect  EKG:  EKG is not ordered today. Personal review of the ekg ordered 09/14/16 shows sinus rhythm, rate 88, PVCs  SVT with rate 191  Recent Labs: 03/15/2016: ALT 16 09/15/2016: BUN 32; Creatinine, Ser 7.04; Hemoglobin 10.7; Platelets 181; Potassium 3.8; Sodium 138; TSH 1.254    Lipid Panel     Component Value Date/Time   CHOL 147 06/14/2016 0903   TRIG 119.0 06/14/2016 0903   HDL 36.70 (L) 06/14/2016 0903   CHOLHDL 4 06/14/2016 0903   VLDL 23.8 06/14/2016 0903   LDLCALC 87 06/14/2016 0903     Wt Readings from Last 3 Encounters:  11/13/16 298 lb (135.2 kg)  10/05/16 293 lb 3.2 oz (133 kg)  09/25/16 298 lb (135.2 kg)      Other studies Reviewed: Additional studies/ records that were reviewed today include: Stress TTE 10/20/15  Review of the above records today demonstrates:  - Stress ECG conclusions: There were no stress arrhythmias or   conduction abnormalities. 1 mm ST segment depression with   inverted [place] in recovery. - Staged echo: There was no echocardiographic evidence for    stress-induced ischemia. - Impressions: [place] study with normal echo images and positive   ECG in recovery Consider f/u cardiac CT or other imaging  Impressions:  -  [place] study with normal echo images and positive ECG in   recovery Consider f/u cardiac CT or other imaging   ASSESSMENT AND PLAN:  1.  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 Plan for ablation 11/16/16. Risks and benefits of ablation were discussed. Risks include bleeding, tamponade, heart block, and stroke, among others. He understands these risks and has agreed to ablation. We'll continue his verapamil in the interim.  2. End-stage renal disease: Dialysis Monday Wednesday Friday. He Robert [name] have dialysis after ablation.  3. Hypertension: Mildly elevated but overall well controlled. No medical changes at this time.  4. Obstructive sleep apnea: On BiPAP. Encouraged weight loss. He Robert [name] likely need to wear his BiPAP during the ablation procedure.  Current medicines are reviewed at length with the patient today.   The patient does not have concerns regarding his medicines.  The following changes were made today:  none  Labs/ tests ordered today include:  No orders of the defined types were placed in this encounter.    Disposition:   FU with Robert [name] 1 months  Signed, Robert Arnall Meredith Leeds, MD  11/13/2016 9:09 AM     [facility] [address] [place] [address] [phone] (office) [phone] (fax)

## 2016-11-14 ENCOUNTER — Other Ambulatory Visit: Payer: Self-pay | Admitting: Endocrinology

## 2016-11-22 ENCOUNTER — Ambulatory Visit (INDEPENDENT_AMBULATORY_CARE_PROVIDER_SITE_OTHER): Payer: BLUE CROSS/BLUE SHIELD | Admitting: Endocrinology

## 2016-11-22 ENCOUNTER — Encounter: Payer: Self-pay | Admitting: Endocrinology

## 2016-11-22 VITALS — BP 128/82 | HR 94 | Ht 72.0 in | Wt 294.0 lb

## 2016-11-22 DIAGNOSIS — Z794 Long term (current) use of insulin: Secondary | ICD-10-CM | POA: Diagnosis not present

## 2016-11-22 DIAGNOSIS — E1165 Type 2 diabetes mellitus with hyperglycemia: Secondary | ICD-10-CM

## 2016-11-22 MED ORDER — FREESTYLE LIBRE SENSOR SYSTEM MISC: 1.0000 | 3 refills | Status: DC

## 2016-11-22 MED ORDER — FREESTYLE LIBRE READER DEVI: 1.0000 | 0 refills | Status: DC

## 2016-11-22 NOTE — Progress Notes (Signed)
Patient ID: DAL BLEW, male   DOB: [birth year], 58 y.o.   MRN: [id number]   Reason for Appointment: Type II Diabetes follow-up   History of Present Illness   Diagnosis date:  2012  Previous history: His blood sugar has been difficult to control since onset. Initially was taking oral hypoglycemic drugs like glyburide but could not take metformin because of renal dysfunction. Since Victoza did not help his control he was started on Lantus and then U 500 insulin also His A1c has previously ranged from 7.8-9.8, the lowest level in 03/2013 He was on Victoza but because of his markedly decreased appetite and weight loss this was stopped in 9/15  Recent history:   Insulin regimen: Tresiba 28 units In a.m.  U-500 insulin with syringe: Usually carbohydrate coverage 1:3 ratio  His A1c is usually lower than expected for his blood sugars and now 5.8  Current blood sugar patterns, management and problems identified:  Fasting blood sugars have been checked periodically and they are fairly good but has had 3 readings in the low range also, he does think he feels symptomatic when blood sugars are in the 60s are below  He does not understand the need to reduce Tresiba 11 fasting readings are low  After his last visit he has started trying to estimate carbohydrates in his meals and adjust his insulin based on what he is eating.  Although he was told to try a 1:8 carbohydrate ratio he is taking 3 units per 15 g of carbohydrates such as a slice of bread  He thinks this works out fairly well  His postprandial readings upping checked periodically and bizarre recently fairly good with only sporadic high reading after breakfast or lunch  He does sometimes eat out at lunchtime through his work  Most of his readings after supper are fairly good  Has only one low reading after lunch of 54  More recently has been trying to walk more regularly  Taking Tresiba in the morning  Proper  timing of medications in relation to meals: Yes.          Monitors blood glucose: Once a day or less.    Glucometer: One Probation officer.          Blood Glucose readings by   Mean values apply above for all meters except median for One Touch  PRE-MEAL Fasting Lunch Afternoon  Bedtime Overall  Glucose range: 51-119  132-239  54-218  102-207  51-239   Mean/median: 83   133  153  137   :  Meals: 2-3 meals per day. 8 am and Noon and Dinner 6-7 PM .   Is on the low phosphorus diet and has fewer carbohydrates, not eating cereals        Physical activity: exercise: walking, nearly everyday         Weight control:  Wt Readings from Last 3 Encounters:  11/22/16 294 lb (133.4 kg)  11/13/16 298 lb (135.2 kg)  10/05/16 293 lb 3.2 oz (469 kg)        Complications:   nephropathy, neuropathy   Diabetes labs:   Lab Results  Component Value Date   HGBA1C 5.8 11/13/2016   HGBA1C 5.9 06/14/2016   HGBA1C 5.7 12/15/2015   Lab Results  Component Value Date   MICROALBUR 224.5 Repeated and verified X2. (H) 01/04/2014   LDLCALC 68 11/13/2016   CREATININE 8.36 (H) 11/13/2016    Lab Results  Component Value Date  FRUCTOSAMINE 317 (H) 08/20/2016   FRUCTOSAMINE 305 (H) 06/14/2016   FRUCTOSAMINE 359 (H) 03/15/2016     No visits with results within 1 Week(s) from this visit.  Latest known visit with results is:  Lab on 11/13/2016  Component Date Value Ref Range Status  . Glucose 11/13/2016 99  65 - 99 mg/dL Final  . BUN 11/13/2016 49* 6 - 24 mg/dL Final  . Creatinine, Ser 11/13/2016 8.36* 0.76 - 1.27 mg/dL Final  . GFR calc non Af Amer 11/13/2016 6* >59 mL/min/1.73 Final  . GFR calc Af Amer 11/13/2016 7* >59 mL/min/1.73 Final  . BUN/Creatinine Ratio 11/13/2016 6* 9 - 20 Final  . Sodium 11/13/2016 140  134 - 144 mmol/L Final  . Potassium 11/13/2016 4.5  3.5 - 5.2 mmol/L Final  . Chloride 11/13/2016 95* 96 - 106 mmol/L Final  . CO2 11/13/2016 22  18 - 29 mmol/L Final  . Calcium 11/13/2016  8.4* 8.7 - 10.2 mg/dL Final  . WBC 11/13/2016 6.1  3.4 - 10.8 x10E3/uL Final  . RBC 11/13/2016 3.40* 4.14 - 5.80 x10E6/uL Final  . Hemoglobin 11/13/2016 10.7* 13.0 - 17.7 g/dL Final  . Hematocrit 11/13/2016 31.7* 37.5 - 51.0 % Final  . MCV 11/13/2016 93  79 - 97 fL Final  . MCH 11/13/2016 31.5  26.6 - 33.0 pg Final  . MCHC 11/13/2016 33.8  31.5 - 35.7 g/dL Final  . RDW 11/13/2016 16.1* 12.3 - 15.4 % Final  . Platelets 11/13/2016 205  150 - 379 x10E3/uL Final  . Neutrophils 11/13/2016 66  Not Estab. % Final  . Lymphs 11/13/2016 15  Not Estab. % Final  . Monocytes 11/13/2016 14  Not Estab. % Final  . Eos 11/13/2016 3  Not Estab. % Final  . Basos 11/13/2016 1  Not Estab. % Final  . Neutrophils Absolute 11/13/2016 4.1  1.4 - 7.0 x10E3/uL Final  . Lymphocytes Absolute 11/13/2016 0.9  0.7 - 3.1 x10E3/uL Final  . Monocytes Absolute 11/13/2016 0.9  0.1 - 0.9 x10E3/uL Final  . EOS (ABSOLUTE) 11/13/2016 0.2  0.0 - 0.4 x10E3/uL Final  . Basophils Absolute 11/13/2016 0.0  0.0 - 0.2 x10E3/uL Final  . Immature Granulocytes 11/13/2016 1  Not Estab. % Final  . Immature Grans (Abs) 11/13/2016 0.1  0.0 - 0.1 x10E3/uL Final    OTHER problems: See review of systems    Allergies as of 11/22/2016   No Known Allergies     Medication List       Accurate as of 11/22/16 11:59 PM. Always use your most recent med list.          aspirin EC 81 MG tablet Take 81 mg by mouth every morning.   B-D UF III MINI PEN NEEDLES 31G X 5 MM Misc Generic drug:  Insulin Pen Needle USE 4 PER DAY TO INJECT INSULIN.   calcitRIOL 0.25 MCG capsule Commonly known as:  ROCALTROL Take 0.75 mcg by mouth every Monday, Wednesday, and Friday. DURING DIALYSIS   calcium acetate 667 MG capsule Commonly known as:  PHOSLO Take 667 mg by mouth See admin instructions.   cetirizine 10 MG tablet Commonly known as:  ZYRTEC Take 10 mg by mouth every morning.   docusate sodium 100 MG capsule Commonly known as:  COLACE Take  100 mg by mouth daily.   DULoxetine 30 MG capsule Commonly known as:  CYMBALTA Take 30 mg by mouth daily.   ethyl chloride spray USE PRE DIALYSIS AS NEEDED   fluocinonide cream  0.05 % Commonly known as:  LIDEX Apply 1 application topically daily as needed (Skin inflammation (hand, thigh, back)).   FOSRENOL 1000 MG chewable tablet Generic drug:  lanthanum Chew 1,000-3,000 mg by mouth See admin instructions. 3000 mg with every meal three times daily and 1000 mg with each snack twice daily   FREESTYLE LIBRE READER Devi 1 Device by Does not apply route as directed.   FREESTYLE LIBRE SENSOR SYSTEM Misc 1 strip by Does not apply route once a week. Apply to upper arm and change sensor every 10 days   gabapentin 100 MG capsule Commonly known as:  NEURONTIN Take 200 mg by mouth every morning.   HUMULIN R 500 UNIT/ML injection Generic drug:  insulin regular human CONCENTRATED Using a U-100 1cc/31G/5/16" INSULIN SYRINGE, draw up and inject 5-18 units into the skin three times a day 30 minutes prior to each meal   levothyroxine 100 MCG tablet Commonly known as:  SYNTHROID, LEVOTHROID TAKE 1 TABLET (100 MCG TOTAL) BY MOUTH DAILY.   lidocaine-prilocaine cream Commonly known as:  EMLA APPLY SMALL AMOUNT TO DIALYSIS SITE 30 -60 MINUTES PRIOR TO DIALYSIS 3X A WEEK   polyethylene glycol packet Commonly known as:  MIRALAX / GLYCOLAX Take 17 g by mouth daily.   rosuvastatin 10 MG tablet Commonly known as:  CRESTOR TAKE 1 TABLET BY MOUTH EVERY DAY   senna 8.6 MG tablet Commonly known as:  SENOKOT Take 1 tablet by mouth daily.   SENSIPAR 60 MG tablet Generic drug:  cinacalcet Take 60 mg by mouth daily.   traMADol 50 MG tablet Commonly known as:  ULTRAM Take 1 tablet (50 mg total) by mouth every 6 (six) hours as needed.   traZODone 50 MG tablet Commonly known as:  DESYREL Take 50-100 mg by mouth at bedtime as needed for sleep.   TRESIBA FLEXTOUCH 100 UNIT/ML Sopn FlexTouch  Pen Generic drug:  insulin degludec INJECT 30 UNITS INTO THE SKIN EVERY MORNING   zolpidem 10 MG tablet Commonly known as:  AMBIEN Take 10-20 mg by mouth at bedtime as needed for sleep.       Allergies: No Known Allergies  Past Medical History:  Diagnosis Date  . Anemia    low hgb at present  . Arthritis    FEET  . Blood transfusion without reported diagnosis 1974   kidney removed - L  . Cancer ([place])    skin ca right shoulder, plastic dsyplasia, pre-Ca polpys removed on Colonoscopy- 07/2014  . Colon polyps 07/23/2014   Tubular adenomas x 5  . Constipation   . Diabetes mellitus without complication (HCC)    Type 2  . Difficult intubation    unsure of actual problem but it was during the January 28, 2015 procedure.  Marland Kitchen Dysrhythmia   . Eczema   . ESRD (end stage renal disease) ([place])    hEMO mwf  . Headache(784.0)    slight dull h/a due kidney failure  . Hyperlipidemia   . Hypertension    SVT h/o , followed by Dr. Claiborne Billings  . Hypothyroidism   . Macular degeneration   . Neuropathy (HCC)    right hand and both feet  . PAT (paroxysmal atrial tachycardia) ([place])   . PSVT (paroxysmal supraventricular tachycardia) ([place])   . SBO (small bowel obstruction)   . Shortness of breath    with exertion  . Sleep apnea 06/18/11   split-night sleep study- [facility]., BiPAP- 13-16  . Thyroid disease   .  Umbilical hernia    will repair with thi surgery    Past Surgical History:  Procedure Laterality Date  . AV FISTULA PLACEMENT Right 02/25/2015   Procedure: RIGHT ARTERIOVENOUS (AV) FISTULA CREATION;  Surgeon: Serafina Mitchell, MD;  Location: [place];  Service: Vascular;  Laterality: Right;  . AV FISTULA PLACEMENT Right 09/15/2015   Procedure: ARTERIOVENOUS (AV) FISTULA CREATION- RIGHT ARM;  Surgeon: Serafina Mitchell, MD;  Location: [place];  Service: Vascular;  Laterality: Right;  . CAPD INSERTION N/A 05/18/2014   Procedure: LAPAROSCOPIC INSERTION CONTINUOUS AMBULATORY  PERITONEAL DIALYSIS  (CAPD) CATHETER;  Surgeon: Ralene Ok, MD;  Location: [place];  Service: General;  Laterality: N/A;  . COLONOSCOPY    . COLONOSCOPY WITH PROPOFOL N/A 07/12/2016   Procedure: COLONOSCOPY WITH PROPOFOL;  Surgeon: [name], MD;  Location: [facility];  Service: Endoscopy;  Laterality: N/A;  . declotting of fistula  08/2015  . ESOPHAGOGASTRODUODENOSCOPY (EGD) WITH PROPOFOL N/A 07/12/2016   Procedure: ESOPHAGOGASTRODUODENOSCOPY (EGD) WITH PROPOFOL;  Surgeon: [name], MD;  [facility];  Service: Endoscopy;  Laterality: N/A;  . FISTULA SUPERFICIALIZATION Right 02/07/2016   Procedure: RIIGHT UPPER ARM FISTULA SUPERFICIALIZATION;  Surgeon: Elam Dutch, MD;  Location: [place];  Service: Vascular;  Laterality: Right;  . IJ catheter insertion    . INSERTION OF DIALYSIS CATHETER Right 02/25/2015   Procedure: INSERTION OF RIGHT INTERNAL JUGULAR DIALYSIS CATHETER;  Surgeon: Serafina Mitchell, MD;  Location: [place];  Service: Vascular;  Laterality: Right;  . LAPAROSCOPIC REPOSITIONING CAPD CATHETER N/A 06/16/2014   Procedure: LAPAROSCOPIC REPOSITIONING CAPD CATHETER;  Surgeon: Ralene Ok, MD;  Location: [place];  Service: General;  Laterality: N/A;  . LAPAROSCOPY N/A 05/04/2015   Procedure: LAPAROSCOPY DIAGNOSTIC LYSIS OF ADHESIONS;  Surgeon: Coralie Keens, MD;  Location: [place];  Service: General;  Laterality: N/A;  . MINOR REMOVAL OF PERITONEAL DIALYSIS CATHETER N/A 04/28/2015   Procedure:  REMOVAL OF PERITONEAL DIALYSIS CATHETER;  Surgeon: Coralie Keens, MD;  Location: [place];  Service: General;  Laterality: N/A;  . NEPHRECTOMY Left 1974  . RENAL BIOPSY Right 2012  . REVISON OF ARTERIOVENOUS FISTULA Right 05/26/2015   Procedure: SUPERFICIALIZATION OF ARTERIOVENOUS FISTULA WITH SIDE BRANCH LIGATIONS;  Surgeon: Serafina Mitchell, MD;  Location: [place];  Service: Vascular;  Laterality: Right;  . SKIN CANCER EXCISION     right shoulder  . UMBILICAL HERNIA REPAIR  N/A 05/18/2014   Procedure: HERNIA REPAIR UMBILICAL ADULT;  Surgeon: Ralene Ok, MD;  Location: [place];  Service: General;  Laterality: N/A;  . UMBILICAL HERNIA REPAIR N/A 04/28/2015   Procedure: UMBILICAL HERNIA REPAIR WITH MESH;  Surgeon: Coralie Keens, MD;  Location[facility];  Service: General;  Laterality: N/A;    Family History  Problem Relation Age of Onset  . Colon polyps Father   . Colon cancer Neg Hx   . Stomach cancer Neg Hx     Social History:  reports that he quit smoking about 3 years ago. His smoking use included Cigars. He has never used smokeless tobacco. He reports that he drinks about 0.6 oz of alcohol per week . He reports that he does not use drugs.  Review of Systems:  Hypertension:  BP is Recently better control  His blood pressure has been managed by his nephrologist   BP Readings from Last 3 Encounters:  11/23/16 140/63  11/22/16 128/82  11/13/16 112/68     Lipids:  His LDL is below 100 with 10 mg  Crestor HDL is persistently low Has no evidence of vascular disease    Lab Results  Component Value Date   CHOL 130 11/13/2016   CHOL 147 06/14/2016   CHOL 161 03/15/2016   Lab Results  Component Value Date   HDL 41.40 11/13/2016   HDL 36.70 (L) 06/14/2016   HDL 37.70 (L) 03/15/2016   Lab Results  Component Value Date   LDLCALC 68 11/13/2016   LDLCALC 87 06/14/2016   LDLCALC 96 03/15/2016   Lab Results  Component Value Date   TRIG 102.0 11/13/2016   TRIG 119.0 06/14/2016   TRIG 135.0 03/15/2016   Lab Results  Component Value Date   CHOLHDL 3 11/13/2016   CHOLHDL 4 06/14/2016   CHOLHDL 4 03/15/2016   No results found for: LDLDIRECT   HYPOTHYROIDISM: He has had long-standing hypothyroidism  Has been taking his 100 g levothyroxine before breakfast consistently He feels fairly good and recent TSH is normal   Lab Results  Component Value Date   TSH 1.11 11/13/2016    He is on waiting list for  renal transplant and is going to  [facility] for this    HYPOGONADISM: 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 metabolic syndrome and had subjectively improved with supplementation using Fortesta  He has been off testosterone for Over a year and free testosterone was normal recently  Lab Results  Component Value Date   TESTOSTERONE 369 06/14/2016    Diabetic neuropathy: Has mild sensory loss on his last exam   Examination:   BP 128/82   Pulse 94   Ht 6' (1.829 m)   Wt 294 lb (133.4 kg)   SpO2 97%   BMI 39.87 kg/m   Body mass index is 39.87 kg/m.    ASSESSMENT/ PLAN:    Diabetes type 2 with obesity:  See history of present illness for detailed discussion of his current management, problems identified and sugar patterns lately  His blood sugars appear to be overall more stable and less inconsistent compared to the last time Again his A1c is lower than expected for his home blood sugar average of 137 Previously fructosamine mildly increased He is finding carbohydrate counting useful way of calculating his insulin and so far his estimated of 1:3 coverage generally works out fairly good for his meals Only occasionally has had a relatively high or low reading with his mealtime doses FASTING readings however are low normal including some low sugars   He is trying to do better with exercise and trying to lose weight  Only change in management would be to reduce to see better 26 units and another 2 units of morning readings are around 80-90 average consistently   Patient Instructions  Reduce Tresiba to 26 units, keep am waking sugars in 80-120 range   Counseling time on subjects discussed above is over 50% of today's 25 minute visit   Sherina Stammer 11/23/2016, 12:29 PM

## 2016-11-22 NOTE — Patient Instructions (Signed)
Reduce Tresiba to 26 units, keep am waking sugars in 80-120 range

## 2016-11-23 ENCOUNTER — Ambulatory Visit (HOSPITAL_COMMUNITY): Payer: BLUE CROSS/BLUE SHIELD | Admitting: Critical Care Medicine

## 2016-11-23 ENCOUNTER — Encounter (HOSPITAL_COMMUNITY): Payer: Self-pay | Admitting: Critical Care Medicine

## 2016-11-23 ENCOUNTER — Ambulatory Visit (HOSPITAL_COMMUNITY)
Admission: RE | Admit: 2016-11-23 | Discharge: 2016-11-23 | Disposition: A | Discharge status: Home / Self Care | Payer: BLUE CROSS/BLUE SHIELD | Source: Ambulatory Visit | Attending: Cardiology | Admitting: Cardiology

## 2016-11-23 ENCOUNTER — Encounter (HOSPITAL_COMMUNITY)
Admission: RE | Disposition: A | Discharge status: Home / Self Care | Payer: Self-pay | Source: Ambulatory Visit | Attending: Cardiology

## 2016-11-23 DIAGNOSIS — N186 End stage renal disease: Secondary | ICD-10-CM | POA: Insufficient documentation

## 2016-11-23 DIAGNOSIS — M1990 Unspecified osteoarthritis, unspecified site: Secondary | ICD-10-CM | POA: Insufficient documentation

## 2016-11-23 DIAGNOSIS — D631 Anemia in chronic kidney disease: Secondary | ICD-10-CM | POA: Diagnosis not present

## 2016-11-23 DIAGNOSIS — I120 Hypertensive chronic kidney disease with stage 5 chronic kidney disease or end stage renal disease: Secondary | ICD-10-CM | POA: Insufficient documentation

## 2016-11-23 DIAGNOSIS — E1122 Type 2 diabetes mellitus with diabetic chronic kidney disease: Secondary | ICD-10-CM | POA: Diagnosis not present

## 2016-11-23 DIAGNOSIS — G4730 Sleep apnea, unspecified: Secondary | ICD-10-CM | POA: Diagnosis not present

## 2016-11-23 DIAGNOSIS — Z87891 Personal history of nicotine dependence: Secondary | ICD-10-CM | POA: Insufficient documentation

## 2016-11-23 DIAGNOSIS — E785 Hyperlipidemia, unspecified: Secondary | ICD-10-CM | POA: Diagnosis not present

## 2016-11-23 DIAGNOSIS — I471 Supraventricular tachycardia: Secondary | ICD-10-CM | POA: Diagnosis present

## 2016-11-23 DIAGNOSIS — E669 Obesity, unspecified: Secondary | ICD-10-CM | POA: Diagnosis not present

## 2016-11-23 HISTORY — PX: SVT ABLATION: EP1225

## 2016-11-23 LAB — GLUCOSE, CAPILLARY
GLUCOSE-CAPILLARY: 123 mg/dL — AB (ref 65–99)
GLUCOSE-CAPILLARY: 99 mg/dL (ref 65–99)
Glucose-Capillary: 117 mg/dL — ABNORMAL HIGH (ref 65–99)

## 2016-11-23 SURGERY — SVT ABLATION
Anesthesia: General

## 2016-11-23 MED ORDER — HEPARIN (PORCINE) IN NACL 2-0.9 UNIT/ML-% IJ SOLN
INTRAMUSCULAR | Status: AC
  Filled 2016-11-23: qty 500

## 2016-11-23 MED ORDER — BUPIVACAINE HCL (PF) 0.25 % IJ SOLN
INTRAMUSCULAR | Status: DC | PRN
Start: 2016-11-23 — End: 2016-11-23
  Administered 2016-11-23: 45 mL

## 2016-11-23 MED ORDER — INSULIN ASPART 100 UNIT/ML ~~LOC~~ SOLN
0.0000 [IU] | SUBCUTANEOUS | Status: DC
  Filled 2016-11-23: qty 0.2

## 2016-11-23 MED ORDER — BUPIVACAINE HCL (PF) 0.25 % IJ SOLN
INTRAMUSCULAR | Status: AC
  Filled 2016-11-23: qty 60

## 2016-11-23 MED ORDER — SODIUM CHLORIDE 0.9% FLUSH: 3.0000 mL | Freq: Two times a day (BID) | INTRAVENOUS | Status: DC

## 2016-11-23 MED ORDER — MIDAZOLAM HCL 5 MG/5ML IJ SOLN
INTRAMUSCULAR | Status: DC | PRN
  Administered 2016-11-23: 2 mg via INTRAVENOUS

## 2016-11-23 MED ORDER — SODIUM CHLORIDE 0.9 % IV SOLN: 250.0000 mL | INTRAVENOUS | Status: DC | PRN

## 2016-11-23 MED ORDER — PROPOFOL 10 MG/ML IV BOLUS
INTRAVENOUS | Status: DC | PRN
  Administered 2016-11-23: 50 mg via INTRAVENOUS
  Administered 2016-11-23: 30 mg via INTRAVENOUS
  Administered 2016-11-23: 200 mg via INTRAVENOUS
  Administered 2016-11-23: 50 mg via INTRAVENOUS

## 2016-11-23 MED ORDER — SODIUM CHLORIDE 0.9 % IV SOLN
INTRAVENOUS | Status: DC | PRN
  Administered 2016-11-23: 07:00:00 via INTRAVENOUS

## 2016-11-23 MED ORDER — ONDANSETRON HCL 4 MG/2ML IJ SOLN
INTRAMUSCULAR | Status: DC | PRN
  Administered 2016-11-23: 4 mg via INTRAVENOUS

## 2016-11-23 MED ORDER — ONDANSETRON HCL 4 MG/2ML IJ SOLN: 4.0000 mg | Freq: Four times a day (QID) | INTRAMUSCULAR | Status: DC | PRN

## 2016-11-23 MED ORDER — ISOPROTERENOL HCL 0.2 MG/ML IJ SOLN
INTRAMUSCULAR | Status: DC | PRN
  Administered 2016-11-23: 4 ug/min via INTRAVENOUS

## 2016-11-23 MED ORDER — SUCCINYLCHOLINE CHLORIDE 200 MG/10ML IV SOSY
PREFILLED_SYRINGE | INTRAVENOUS | Status: DC | PRN
  Administered 2016-11-23: 120 mg via INTRAVENOUS

## 2016-11-23 MED ORDER — FENTANYL CITRATE (PF) 100 MCG/2ML IJ SOLN
INTRAMUSCULAR | Status: DC | PRN
  Administered 2016-11-23 (×3): 50 ug via INTRAVENOUS

## 2016-11-23 MED ORDER — ACETAMINOPHEN 325 MG PO TABS: 650.0000 mg | ORAL_TABLET | ORAL | Status: DC | PRN

## 2016-11-23 MED ORDER — METOPROLOL TARTRATE 50 MG PO TABS: 50.0000 mg | ORAL_TABLET | Freq: Two times a day (BID) | ORAL | 3 refills | Status: DC

## 2016-11-23 MED ORDER — SODIUM CHLORIDE 0.9% FLUSH: 3.0000 mL | INTRAVENOUS | Status: DC | PRN

## 2016-11-23 MED ORDER — LIDOCAINE HCL (CARDIAC) 20 MG/ML IV SOLN
INTRAVENOUS | Status: DC | PRN
  Administered 2016-11-23: 100 mg via INTRAVENOUS

## 2016-11-23 MED ORDER — HEPARIN (PORCINE) IN NACL 2-0.9 UNIT/ML-% IJ SOLN
INTRAMUSCULAR | Status: DC | PRN
  Administered 2016-11-23: 08:00:00

## 2016-11-23 MED ORDER — ISOPROTERENOL HCL 0.2 MG/ML IJ SOLN
INTRAMUSCULAR | Status: AC
  Filled 2016-11-23: qty 5

## 2016-11-23 MED ORDER — PHENYLEPHRINE HCL 10 MG/ML IJ SOLN
INTRAMUSCULAR | Status: DC | PRN
  Administered 2016-11-23: 15 ug/min via INTRAVENOUS

## 2016-11-23 SURGICAL SUPPLY — 12 items
BAG SNAP BAND KOVER 36X36 (MISCELLANEOUS) ×3 IMPLANT
BLANKET WARM UNDERBOD FULL ACC (MISCELLANEOUS) ×3 IMPLANT
CATH JOSEPH QUAD ALLRED 6F REP (CATHETERS) ×4 IMPLANT
CATH WEBSTER BI DIR CS D-F CRV (CATHETERS) ×2 IMPLANT
PACK EP LATEX FREE (CUSTOM PROCEDURE TRAY) ×3
PACK EP LF (CUSTOM PROCEDURE TRAY) ×1 IMPLANT
PAD DEFIB LIFELINK (PAD) ×3 IMPLANT
PATCH CARTO3 (PAD) ×2 IMPLANT
SHEATH PINNACLE 6F 10CM (SHEATH) ×4 IMPLANT
SHEATH PINNACLE 7F 10CM (SHEATH) ×2 IMPLANT
SHEATH PINNACLE 8F 10CM (SHEATH) ×2 IMPLANT
SHIELD RADPAD SCOOP 12X17 (MISCELLANEOUS) ×3 IMPLANT

## 2016-11-23 NOTE — Anesthesia Preprocedure Evaluation (Addendum)
Anesthesia Evaluation  Patient identified by MRN, date of birth, ID band  Airway Mallampati: I  TM Distance: >3 FB     Dental  (+) Teeth Intact, Dental Advisory Given   Pulmonary shortness of breath, sleep apnea , former smoker,    breath sounds clear to auscultation       Cardiovascular hypertension, + dysrhythmias  Rhythm:Regular Rate:Normal     Neuro/Psych    GI/Hepatic   Endo/Other  diabetesHypothyroidism   Renal/GU Renal disease     Musculoskeletal  (+) Arthritis ,   Abdominal (+) + obese,   Peds  Hematology  (+) anemia ,   Anesthesia Other Findings   Reproductive/Obstetrics                           Anesthesia Physical Anesthesia Plan  ASA: IV  Anesthesia Plan: General   Post-op Pain Management:    Induction: Intravenous  Airway Management Planned: Oral ETT  Additional Equipment:   Intra-op Plan:   Post-operative Plan: Extubation in OR  Informed Consent: I have reviewed the patients History and Physical, chart, labs and discussed the procedure including the risks, benefits and alternatives for the proposed anesthesia with the patient or authorized representative who has indicated his/her understanding and acceptance.   Dental advisory given  Plan Discussed with:   Anesthesia Plan Comments:         Anesthesia Quick Evaluation

## 2016-11-23 NOTE — Progress Notes (Signed)
The patient was seen post-procedure by Dr. Curt Bears and cleared to discharge at 3:30PM today, he will have his dialysis as usual today at his out patient center, scheduled for 4:00PM.  I have spoken with Joseph Art, the charge RN at the center, it will be OK if the patient arrives a little late as long as it is by 4:45-5:00PM, in iformed the patient of this.  Follow-up has been scheduled for the patient. I discussed activity restrictions, site care with the patient. I advised the patient there are no changes being made to his home medicines but are starting him on metoprolol 50mg  BID.  Tommye Standard, PAC  Will Curt Bears, MD

## 2016-11-23 NOTE — Progress Notes (Addendum)
Site area: Right groin a 7 french and 8 french venous sheaths was removed  Site Prior to Removal:  Level 0  Pressure Applied For 20 MINUTES    Minutes Beginning at 1030am  Manual:   Yes.    Patient Status During Pull:  stable  Post Pull Groin Site:  Level 0  Post Pull Instructions Given:  Yes.    Post Pull Pulses Present:  Yes.    Dressing Applied:  Yes.    Comments:  VS remain stable during sheath pull.

## 2016-11-23 NOTE — Progress Notes (Signed)
Site area: Left groin a 6 french venous sheaths X2 was removed  Site Prior to Removal:  Level 0  Pressure Applied For 20 MINUTES    Bedrest Beginning at 1030a  Manual:   Yes  Patient Status During Pull:  stable  Post Pull Groin Site:  Level 0  Post Pull Instructions Given:  Yes.    Post Pull Pulses Present:  Yes.    Dressing Applied:  Yes.    Comments:  VS remain stable

## 2016-11-23 NOTE — Anesthesia Procedure Notes (Signed)
Procedure Name: Intubation Date/Time: 11/23/2016 7:45 AM Performed by: Merrilyn Puma B Pre-anesthesia Checklist: Patient identified, Emergency Drugs available, Suction available, Patient being monitored and Timeout performed Patient Re-evaluated:Patient Re-evaluated prior to inductionOxygen Delivery Method: Circle system utilized Preoxygenation: Pre-oxygenation with 100% oxygen Intubation Type: IV induction Ventilation: Mask ventilation without difficulty and Two handed mask ventilation required Grade View: Grade I Tube type: Oral Tube size: 8.0 mm Number of attempts: 1 Airway Equipment and Method: Video-laryngoscopy and Stylet Placement Confirmation: ETT inserted through vocal cords under direct vision,  positive ETCO2,  CO2 detector and breath sounds checked- equal and bilateral Secured at: 24 cm Tube secured with: Tape Dental Injury: Teeth and Oropharynx as per pre-operative assessment

## 2016-11-23 NOTE — Anesthesia Postprocedure Evaluation (Addendum)
Anesthesia Post Note  Patient: GRANGER CHUI  Procedure(s) Performed: Procedure(s) (LRB): SVT Ablation (N/A)  Patient location during evaluation: PACU Anesthesia Type: General Level of consciousness: awake and alert Pain management: pain level controlled Vital Signs Assessment: post-procedure vital signs reviewed and stable Respiratory status: spontaneous breathing, nonlabored ventilation, respiratory function stable and patient connected to nasal cannula oxygen Cardiovascular status: blood pressure returned to baseline and stable Postop Assessment: no signs of nausea or vomiting Anesthetic complications: no       Last Vitals:  Vitals:   11/23/16 1035 11/23/16 1102  BP: (!) 147/63   Pulse: 76   Resp: 13   Temp: 36.6 C 36.7 C    Last Pain:  Vitals:   11/23/16 1102  TempSrc: Oral                 Mardell Cragg,JAMES TERRILL

## 2016-11-23 NOTE — H&P (Signed)
Robert [name] is a 57 y.o. male with a history of ESRD on dialysis, diabetes type 2, hyperlipidemia. Has SVT which has been aborted with vagal maneuvers. On exam, regular rhythm, no murmurs, lungs clear.  Presenting today for ablation of SVT. Risks and benefits of ablation explained. Risks include but not limited to bleeding, tamponade, heart block, and stroke. He understands the risks and has agreed to the procedure.  Keryn Nessler Curt Bears, MD 11/23/2016 7:05 AM

## 2016-11-23 NOTE — Transfer of Care (Signed)
Immediate Anesthesia Transfer of Care Note  Patient: Robert [name]  Procedure(s) Performed: Procedure(s): SVT Ablation (N/A)  Patient Location: Cath Lab  Anesthesia Type:General  Level of Consciousness: awake, alert  and oriented  Airway & Oxygen Therapy: Patient Spontanous Breathing and Patient connected to nasal cannula oxygen  Post-op Assessment: Report given to RN, Post -op Vital signs reviewed and stable and Patient moving all extremities X 4  Post vital signs: Reviewed and stable  Last Vitals: There were no vitals filed for this visit.  Last Pain: There were no vitals filed for this visit.       Complications: No apparent anesthesia complications

## 2016-11-23 NOTE — Discharge Instructions (Signed)
No driving for 1 week. No lifting over 5 lbs for 1 week. No vigorous or sexual activity for 1 week. You may return to work on 11/30/16. Keep procedure site clean & dry. If you notice increased pain, swelling, bleeding or pus, call/return!  You may shower, but no soaking baths/hot tubs/pools for 1 week.      Femoral Site Care Introduction Refer to this sheet in the next few weeks. These instructions provide you with information about caring for yourself after your procedure. Your health care provider may also give you more specific instructions. Your treatment has been planned according to current medical practices, but problems sometimes occur. Call your health care provider if you have any problems or questions after your procedure. What can I expect after the procedure? After your procedure, it is typical to have the following:  Bruising at the site that usually fades within 1-2 weeks.  Blood collecting in the tissue (hematoma) that may be painful to the touch. It should usually decrease in size and tenderness within 1-2 weeks. Follow these instructions at home:  Take medicines only as directed by your health care provider.  You may shower 24-48 hours after the procedure or as directed by your health care provider. Remove the bandage (dressing) and gently wash the site with plain soap and water. Pat the area dry with a clean towel. Do not rub the site, because this may cause bleeding.  Do not take baths, swim, or use a hot tub until your health care provider approves.  Check your insertion site every day for redness, swelling, or drainage.  Do not apply powder or lotion to the site.  Limit use of stairs to twice a day for the first 2-3 days or as directed by your health care provider.  Do not squat for the first 2-3 days or as directed by your health care provider.  Do not lift over 10 lb (4.5 kg) for 5 days after your procedure or as directed by your health care provider.  Ask your  health care provider when it is okay to:  Return to work or school.  Resume usual physical activities or sports.  Resume sexual activity.  Do not drive home if you are discharged the same day as the procedure. Have someone else drive you.  You may drive 24 hours after the procedure unless otherwise instructed by your health care provider.  Do not operate machinery or power tools for 24 hours after the procedure or as directed by your health care provider.  If your procedure was done as an outpatient procedure, which means that you went home the same day as your procedure, a responsible adult should be with you for the first 24 hours after you arrive home.  Keep all follow-up visits as directed by your health care provider. This is important. Contact a health care provider if:  You have a fever.  You have chills.  You have increased bleeding from the site. Hold pressure on the site. Get help right away if:  You have unusual pain at the site.  You have redness, warmth, or swelling at the site.  You have drainage (other than a small amount of blood on the dressing) from the site.  The site is bleeding, and the bleeding does not stop after 30 minutes of holding steady pressure on the site.  Your leg or foot becomes pale, cool, tingly, or numb. This information is not intended to replace advice given to you by your  health care provider. Make sure you discuss any questions you have with your health care provider. Document Released: 05/28/2014 Document Revised: 03/01/2016 Document Reviewed: 04/13/2014  2017 Elsevier No driving for 1 week. No lifting over 5 lbs for 1 week. No vigorous or sexual activity for 1 week. You may return to work on 11/30/16. Keep procedure site clean & dry. If you notice increased pain, swelling, bleeding or pus, call/return!  You may shower, but no soaking baths/hot tubs/pools for 1 week.
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
## 2016-12-05 DIAGNOSIS — N186 End stage renal disease: Secondary | ICD-10-CM | POA: Diagnosis not present

## 2016-12-05 DIAGNOSIS — Z992 Dependence on renal dialysis: Secondary | ICD-10-CM | POA: Diagnosis not present

## 2016-12-05 DIAGNOSIS — E1129 Type 2 diabetes mellitus with other diabetic kidney complication: Secondary | ICD-10-CM | POA: Diagnosis not present

## 2016-12-07 DIAGNOSIS — N186 End stage renal disease: Secondary | ICD-10-CM | POA: Diagnosis not present

## 2016-12-07 DIAGNOSIS — E1129 Type 2 diabetes mellitus with other diabetic kidney complication: Secondary | ICD-10-CM | POA: Diagnosis not present

## 2016-12-07 DIAGNOSIS — D509 Iron deficiency anemia, unspecified: Secondary | ICD-10-CM | POA: Diagnosis not present

## 2016-12-07 DIAGNOSIS — D631 Anemia in chronic kidney disease: Secondary | ICD-10-CM | POA: Diagnosis not present

## 2016-12-07 DIAGNOSIS — N2581 Secondary hyperparathyroidism of renal origin: Secondary | ICD-10-CM | POA: Diagnosis not present

## 2016-12-10 DIAGNOSIS — E1129 Type 2 diabetes mellitus with other diabetic kidney complication: Secondary | ICD-10-CM | POA: Diagnosis not present

## 2016-12-10 DIAGNOSIS — D509 Iron deficiency anemia, unspecified: Secondary | ICD-10-CM | POA: Diagnosis not present

## 2016-12-10 DIAGNOSIS — N186 End stage renal disease: Secondary | ICD-10-CM | POA: Diagnosis not present

## 2016-12-10 DIAGNOSIS — D631 Anemia in chronic kidney disease: Secondary | ICD-10-CM | POA: Diagnosis not present

## 2016-12-10 DIAGNOSIS — N2581 Secondary hyperparathyroidism of renal origin: Secondary | ICD-10-CM | POA: Diagnosis not present

## 2016-12-12 DIAGNOSIS — D509 Iron deficiency anemia, unspecified: Secondary | ICD-10-CM | POA: Diagnosis not present

## 2016-12-12 DIAGNOSIS — N186 End stage renal disease: Secondary | ICD-10-CM | POA: Diagnosis not present

## 2016-12-12 DIAGNOSIS — D631 Anemia in chronic kidney disease: Secondary | ICD-10-CM | POA: Diagnosis not present

## 2016-12-12 DIAGNOSIS — E1129 Type 2 diabetes mellitus with other diabetic kidney complication: Secondary | ICD-10-CM | POA: Diagnosis not present

## 2016-12-12 DIAGNOSIS — N2581 Secondary hyperparathyroidism of renal origin: Secondary | ICD-10-CM | POA: Diagnosis not present

## 2016-12-14 DIAGNOSIS — D509 Iron deficiency anemia, unspecified: Secondary | ICD-10-CM | POA: Diagnosis not present

## 2016-12-14 DIAGNOSIS — E1129 Type 2 diabetes mellitus with other diabetic kidney complication: Secondary | ICD-10-CM | POA: Diagnosis not present

## 2016-12-14 DIAGNOSIS — N2581 Secondary hyperparathyroidism of renal origin: Secondary | ICD-10-CM | POA: Diagnosis not present

## 2016-12-14 DIAGNOSIS — D631 Anemia in chronic kidney disease: Secondary | ICD-10-CM | POA: Diagnosis not present

## 2016-12-14 DIAGNOSIS — N186 End stage renal disease: Secondary | ICD-10-CM | POA: Diagnosis not present

## 2016-12-17 DIAGNOSIS — N2581 Secondary hyperparathyroidism of renal origin: Secondary | ICD-10-CM | POA: Diagnosis not present

## 2016-12-17 DIAGNOSIS — N186 End stage renal disease: Secondary | ICD-10-CM | POA: Diagnosis not present

## 2016-12-17 DIAGNOSIS — E1129 Type 2 diabetes mellitus with other diabetic kidney complication: Secondary | ICD-10-CM | POA: Diagnosis not present

## 2016-12-17 DIAGNOSIS — D631 Anemia in chronic kidney disease: Secondary | ICD-10-CM | POA: Diagnosis not present

## 2016-12-17 DIAGNOSIS — D509 Iron deficiency anemia, unspecified: Secondary | ICD-10-CM | POA: Diagnosis not present

## 2016-12-19 ENCOUNTER — Encounter: Payer: Self-pay | Admitting: Cardiology

## 2016-12-19 DIAGNOSIS — D631 Anemia in chronic kidney disease: Secondary | ICD-10-CM | POA: Diagnosis not present

## 2016-12-19 DIAGNOSIS — E1129 Type 2 diabetes mellitus with other diabetic kidney complication: Secondary | ICD-10-CM | POA: Diagnosis not present

## 2016-12-19 DIAGNOSIS — D509 Iron deficiency anemia, unspecified: Secondary | ICD-10-CM | POA: Diagnosis not present

## 2016-12-19 DIAGNOSIS — N186 End stage renal disease: Secondary | ICD-10-CM | POA: Diagnosis not present

## 2016-12-19 DIAGNOSIS — N2581 Secondary hyperparathyroidism of renal origin: Secondary | ICD-10-CM | POA: Diagnosis not present

## 2016-12-21 DIAGNOSIS — E1129 Type 2 diabetes mellitus with other diabetic kidney complication: Secondary | ICD-10-CM | POA: Diagnosis not present

## 2016-12-21 DIAGNOSIS — N186 End stage renal disease: Secondary | ICD-10-CM | POA: Diagnosis not present

## 2016-12-21 DIAGNOSIS — D509 Iron deficiency anemia, unspecified: Secondary | ICD-10-CM | POA: Diagnosis not present

## 2016-12-21 DIAGNOSIS — N2581 Secondary hyperparathyroidism of renal origin: Secondary | ICD-10-CM | POA: Diagnosis not present

## 2016-12-21 DIAGNOSIS — D631 Anemia in chronic kidney disease: Secondary | ICD-10-CM | POA: Diagnosis not present

## 2016-12-24 DIAGNOSIS — N186 End stage renal disease: Secondary | ICD-10-CM | POA: Diagnosis not present

## 2016-12-24 DIAGNOSIS — N2581 Secondary hyperparathyroidism of renal origin: Secondary | ICD-10-CM | POA: Diagnosis not present

## 2016-12-24 DIAGNOSIS — E1129 Type 2 diabetes mellitus with other diabetic kidney complication: Secondary | ICD-10-CM | POA: Diagnosis not present

## 2016-12-24 DIAGNOSIS — D509 Iron deficiency anemia, unspecified: Secondary | ICD-10-CM | POA: Diagnosis not present

## 2016-12-24 DIAGNOSIS — D631 Anemia in chronic kidney disease: Secondary | ICD-10-CM | POA: Diagnosis not present

## 2016-12-25 ENCOUNTER — Telehealth: Payer: Self-pay | Admitting: *Deleted

## 2016-12-25 ENCOUNTER — Encounter: Payer: Self-pay | Admitting: Podiatry

## 2016-12-25 ENCOUNTER — Ambulatory Visit (INDEPENDENT_AMBULATORY_CARE_PROVIDER_SITE_OTHER): Payer: BLUE CROSS/BLUE SHIELD | Admitting: Podiatry

## 2016-12-25 DIAGNOSIS — E1149 Type 2 diabetes mellitus with other diabetic neurological complication: Secondary | ICD-10-CM

## 2016-12-25 DIAGNOSIS — B351 Tinea unguium: Secondary | ICD-10-CM | POA: Diagnosis not present

## 2016-12-25 DIAGNOSIS — M2020 Hallux rigidus, unspecified foot: Secondary | ICD-10-CM

## 2016-12-25 DIAGNOSIS — M79676 Pain in unspecified toe(s): Secondary | ICD-10-CM

## 2016-12-25 DIAGNOSIS — M205X9 Other deformities of toe(s) (acquired), unspecified foot: Secondary | ICD-10-CM

## 2016-12-25 LAB — HM DIABETES FOOT EXAM

## 2016-12-25 NOTE — Telephone Encounter (Signed)
Pt states he has an appt today and would like to begin the process for diabetic shoes including the fitting. I placed a note on Dr. Prudence Davidson schedule.

## 2016-12-25 NOTE — Progress Notes (Signed)
Patient ID: CARLOSDANIEL GROB, male   DOB: 1961/08/07, 56 y.o.   MRN: [id number] Complaint:  Visit Type: Patient returns to my office for continued preventative foot care services. Complaint: Patient states" my nails have grown long and thick and become painful to walk and wear shoes" Patient has been diagnosed with DM and is taking dialysis and neuropathy.  . The patient presents for preventative foot care services. No changes to ROS  Podiatric Exam: Vascular: dorsalis pedis and posterior tibial pulses are palpable bilateral. Capillary return is immediate. Temperature gradient is WNL. Skin turgor WNL  Sensorium: Diminished  Semmes Weinstein monofilament test. Normal tactile sensation bilaterally. Nail Exam: Pt has thick disfigured discolored nails with subungual debris noted bilateral entire nail hallux through fifth toenails Ulcer Exam: There is no evidence of ulcer or pre-ulcerative changes or infection. Orthopedic Exam: Muscle tone and strength are WNL. No limitations in general ROM. No crepitus or effusions noted. Foot type and digits show no abnormalities. Bony prominences are unremarkable. HAV  B/L Skin: No Porokeratosis. No infection or ulcers  Diagnosis:  Onychomycosis, , Pain in right toe, pain in left toes, Diabetic neuropathy  Hallux limitus 1st MPJ  B/L  Treatment & Plan Procedures and Treatment: Consent by patient was obtained for treatment procedures. The patient understood the discussion of treatment and procedures well. All questions were answered thoroughly reviewed. Debridement of mycotic and hypertrophic toenails, 1 through 5 bilateral and clearing of subungual debris. No ulceration, no infection noted.  Initiate diabetic footwear for DPN and hallux limitus. Return Visit-Office Procedure: Patient instructed to return to the office for a follow up visit 3 months for continued evaluation and treatment.    Gardiner Barefoot DPM

## 2016-12-26 DIAGNOSIS — N2581 Secondary hyperparathyroidism of renal origin: Secondary | ICD-10-CM | POA: Diagnosis not present

## 2016-12-26 DIAGNOSIS — D631 Anemia in chronic kidney disease: Secondary | ICD-10-CM | POA: Diagnosis not present

## 2016-12-26 DIAGNOSIS — E1129 Type 2 diabetes mellitus with other diabetic kidney complication: Secondary | ICD-10-CM | POA: Diagnosis not present

## 2016-12-26 DIAGNOSIS — N186 End stage renal disease: Secondary | ICD-10-CM | POA: Diagnosis not present

## 2016-12-26 DIAGNOSIS — D509 Iron deficiency anemia, unspecified: Secondary | ICD-10-CM | POA: Diagnosis not present

## 2016-12-27 ENCOUNTER — Encounter: Payer: Self-pay | Admitting: Cardiology

## 2016-12-27 ENCOUNTER — Ambulatory Visit (INDEPENDENT_AMBULATORY_CARE_PROVIDER_SITE_OTHER): Payer: Medicare Other | Admitting: Cardiology

## 2016-12-27 VITALS — BP 110/80 | HR 82 | Ht 73.0 in | Wt 299.0 lb

## 2016-12-27 DIAGNOSIS — Z9889 Other specified postprocedural states: Secondary | ICD-10-CM

## 2016-12-27 DIAGNOSIS — I471 Supraventricular tachycardia: Secondary | ICD-10-CM | POA: Diagnosis not present

## 2016-12-27 DIAGNOSIS — Z8679 Personal history of other diseases of the circulatory system: Secondary | ICD-10-CM | POA: Diagnosis not present

## 2016-12-27 NOTE — Progress Notes (Signed)
Electrophysiology Office Note   Date:  12/27/2016   ID:  SY SAINTJEAN, DOB December 16, 1960, MRN [id number]  PCP:  Haywood Pao, MD  Cardiologist:  Claiborne Billings Primary Electrophysiologist:  Will Meredith Leeds, MD    Chief Complaint  Patient presents with  . Follow-up    SVT/Post ablation     History of Present Illness: Robert [name] is a 56 y.o. male who presents today for electrophysiology evaluation.   History of ESRD on dialysis, diabetes type 2, hyperlipidemia, and chronic anemia who is brought to the emergency room after palpitations during dialysis. He was found to be in SVT and had vagal maneuvers done which aborted his SVT. In the emergency room he had 2-3 more recurrences of SVT which were all aborted with vagal maneuvers. He was started on verapamil and discharged from the hospital. Negative EP study 11/16/16. He is doing well since his negative EP study. He was put on metoprolol that time. He says that he has not had any further episodes of palpitations. He is having chest pain that started a few weeks after his negative EP study. He says that the pain is worse when he is anxious. He does not have pain with exertion. The pain lasts a few hours and feels like pins and needles in his sternum. He has had a stress echo in the past that had a negative echo portion for ischemia, but did have ST depressions in recovery. He says that he is not having chest pain that is at that level currently.   Today, he denies symptoms of palpitations, chest pain, shortness of breath, orthopnea, PND, lower extremity edema, claudication, dizziness, presyncope, syncope, bleeding, or neurologic sequela. The patient is tolerating medications without difficulties and is otherwise without complaint today.   Of note he is in line for a possible kidney transplant and has a donor lined up.  Past Medical History:  Diagnosis Date  . Anemia    low hgb at present  . Arthritis    FEET  . Blood transfusion  without reported diagnosis 1974   kidney removed - L  . Cancer ([place])    skin ca right shoulder, plastic dsyplasia, pre-Ca polpys removed on Colonoscopy- 07/2014  . Colon polyps 07/23/2014   Tubular adenomas x 5  . Constipation   . Diabetes mellitus without complication (HCC)    Type 2  . Difficult intubation    unsure of actual problem but it was during the January 28, 2015 procedure.  Marland Kitchen Dysrhythmia   . Eczema   . ESRD (end stage renal disease) ([place])    hEMO mwf  . Headache(784.0)    slight dull h/a due kidney failure  . Hyperlipidemia   . Hypertension    SVT h/o , followed by Dr. Claiborne Billings  . Hypothyroidism   . Macular degeneration   . Neuropathy (HCC)    right hand and both feet  . PAT (paroxysmal atrial tachycardia) ([place])   . PSVT (paroxysmal supraventricular tachycardia) ([place])   . SBO (small bowel obstruction)   . Shortness of breath    with exertion  . Sleep apnea 06/18/11   split-night sleep study- [facility]., BiPAP- 13-16  . Thyroid disease   . Umbilical hernia    will repair with thi surgery   Past Surgical History:  Procedure Laterality Date  . AV FISTULA PLACEMENT Right 02/25/2015   Procedure: RIGHT ARTERIOVENOUS (AV) FISTULA CREATION;  Surgeon: Serafina Mitchell, MD;  Location: [facility];  Service: Vascular;  Laterality: Right;  . AV FISTULA PLACEMENT Right 09/15/2015   Procedure: ARTERIOVENOUS (AV) FISTULA CREATION- RIGHT ARM;  Surgeon: Serafina Mitchell, MD;  Location: [facility];  Service: Vascular;  Laterality: Right;  . CAPD INSERTION N/A 05/18/2014   Procedure: LAPAROSCOPIC INSERTION CONTINUOUS AMBULATORY PERITONEAL DIALYSIS  (CAPD) CATHETER;  Surgeon: Ralene Ok, MD;  Location: [facility];  Service: General;  Laterality: N/A;  . COLONOSCOPY    . COLONOSCOPY WITH PROPOFOL N/A 07/12/2016   Procedure: COLONOSCOPY WITH PROPOFOL;  Surgeon: [name], MD;  [facility];  Service: Endoscopy;  Laterality: N/A;  . declotting of fistula  08/2015    . ESOPHAGOGASTRODUODENOSCOPY (EGD) WITH PROPOFOL N/A 07/12/2016   Procedure: ESOPHAGOGASTRODUODENOSCOPY (EGD) WITH PROPOFOL;  Surgeon: [name], MD;  [facility];  Service: Endoscopy;  Laterality: N/A;  . FISTULA SUPERFICIALIZATION Right 02/07/2016   Procedure: RIIGHT UPPER ARM FISTULA SUPERFICIALIZATION;  Surgeon: Elam Dutch, MD;  Location: [place];  Service: Vascular;  Laterality: Right;  . IJ catheter insertion    . INSERTION OF DIALYSIS CATHETER Right 02/25/2015   Procedure: INSERTION OF RIGHT INTERNAL JUGULAR DIALYSIS CATHETER;  Surgeon: Serafina Mitchell, MD;  Location: [facility];  Service: Vascular;  Laterality: Right;  . LAPAROSCOPIC REPOSITIONING CAPD CATHETER N/A 06/16/2014   Procedure: LAPAROSCOPIC REPOSITIONING CAPD CATHETER;  Surgeon: Ralene Ok, MD;  Location: [facility];  Service: General;  Laterality: N/A;  . LAPAROSCOPY N/A 05/04/2015   Procedure: LAPAROSCOPY DIAGNOSTIC LYSIS OF ADHESIONS;  Surgeon: Coralie Keens, MD;  Location: [facility];  Service: General;  Laterality: N/A;  . MINOR REMOVAL OF PERITONEAL DIALYSIS CATHETER N/A 04/28/2015   Procedure:  REMOVAL OF PERITONEAL DIALYSIS CATHETER;  Surgeon: Coralie Keens, MD;  Location: [facility];  Service: General;  Laterality: N/A;  . NEPHRECTOMY Left 1974  . RENAL BIOPSY Right 2012  . REVISON OF ARTERIOVENOUS FISTULA Right 05/26/2015   Procedure: SUPERFICIALIZATION OF ARTERIOVENOUS FISTULA WITH SIDE BRANCH LIGATIONS;  Surgeon: Serafina Mitchell, MD;  Location: [facility];  Service: Vascular;  Laterality: Right;  . SKIN CANCER EXCISION     right shoulder  . SVT ABLATION N/A 11/23/2016   Procedure: SVT Ablation;  Surgeon: Will Meredith Leeds, MD;  Location: [facility];  Service: Cardiovascular;  Laterality: N/A;  . UMBILICAL HERNIA REPAIR N/A 05/18/2014   Procedure: HERNIA REPAIR UMBILICAL ADULT;  Surgeon: Ralene Ok, MD;  Location: [facility];  Service: General;  Laterality: N/A;  . UMBILICAL HERNIA REPAIR N/A 04/28/2015    Procedure: UMBILICAL HERNIA REPAIR WITH MESH;  Surgeon: Coralie Keens, MD;  Location: [facility];  Service: General;  Laterality: N/A;     Current Outpatient Prescriptions  Medication Sig Dispense Refill  . aspirin EC 81 MG tablet Take 81 mg by mouth every morning.    . B-D UF III MINI PEN NEEDLES 31G X 5 MM MISC USE 4 PER DAY TO INJECT INSULIN. 400 each 1  . calcitRIOL (ROCALTROL) 0.25 MCG capsule Take 0.75 mcg by mouth every Monday, Wednesday, and Friday. DURING DIALYSIS    . calcium acetate (PHOSLO) 667 MG capsule Take 667 mg by mouth See admin instructions.     . cetirizine (ZYRTEC) 10 MG tablet Take 10 mg by mouth every morning.     . DULoxetine (CYMBALTA) 30 MG capsule Take 30 mg by mouth daily.     Marland Kitchen ethyl chloride spray USE PRE DIALYSIS AS NEEDED  10  . fluocinonide cream (LIDEX) 8.29 % Apply 1 application topically  daily as needed (Skin inflammation (hand, thigh, back)).     [name] 1000 MG chewable tablet Chew 1,000-3,000 mg by mouth See admin instructions. 3000 mg with every meal three times daily and 1000 mg with each snack twice daily    . gabapentin (NEURONTIN) 100 MG capsule Take 200 mg by mouth every morning.     Marland Kitchen HUMULIN R 500 UNIT/ML injection Using a U-100 1cc/31G/5/16" INSULIN SYRINGE, draw up and inject 5-18 units into the skin three times a day 30 minutes prior to each meal  1  . levothyroxine (SYNTHROID, LEVOTHROID) 100 MCG tablet TAKE 1 TABLET (100 MCG TOTAL) BY MOUTH DAILY. 90 tablet 1  . lidocaine-prilocaine (EMLA) cream APPLY SMALL AMOUNT TO DIALYSIS SITE 30 -60 MINUTES PRIOR TO DIALYSIS 3X A WEEK  4  . metoprolol (LOPRESSOR) 50 MG tablet Take 1 tablet (50 mg total) by mouth 2 (two) times daily. 60 tablet 3  . polyethylene glycol (MIRALAX / GLYCOLAX) packet Take 17 g by mouth daily.    . rosuvastatin (CRESTOR) 10 MG tablet TAKE 1 TABLET BY MOUTH EVERY DAY 90 tablet 1  . SENSIPAR 60 MG tablet Take 60 mg by mouth daily.     . traMADol (ULTRAM) 50 MG tablet Take 1  tablet (50 mg total) by mouth every 6 (six) hours as needed. (Patient taking differently: Take 50-100 mg by mouth every 6 (six) hours as needed (for pain). ) 15 tablet 0  . traZODone (DESYREL) 50 MG tablet Take 50-100 mg by mouth at bedtime as needed for sleep.     . TRESIBA FLEXTOUCH 100 UNIT/ML SOPN FlexTouch Pen INJECT 30 UNITS INTO THE SKIN EVERY MORNING (Patient taking differently: INJECT 28 UNITS INTO THE SKIN EVERY MORNING) 15 pen 1  . zolpidem (AMBIEN) 10 MG tablet Take 10-20 mg by mouth at bedtime as needed for sleep.      No current facility-administered medications for this visit.     Allergies:   Patient has no known allergies.   Social History:  The patient  reports that he quit smoking about 4 years ago. His smoking use included Cigars. He has never used smokeless tobacco. He reports that he drinks about 0.6 oz of alcohol per week . He reports that he does not use drugs.   Family History:  The patient's family history includes Colon polyps in his father.    ROS:  Please see the history of present illness.   Otherwise, review of systems is positive for chest pressure.   All other systems are reviewed and negative.    PHYSICAL EXAM: VS:  BP 110/80   Pulse 82   Ht 6\' 1"  (1.854 m)   Wt 299 lb (135.6 kg)   BMI 39.45 kg/m  , BMI Body mass index is 39.45 kg/m. GEN: Well nourished, well developed, in no acute distress  HEENT: normal  Neck: no JVD, carotid bruits, or masses Cardiac: RRR; no murmurs, rubs, or gallops,no edema  Respiratory:  clear to auscultation bilaterally, normal work of breathing GI: soft, nontender, nondistended, + BS MS: no deformity or atrophy  Skin: warm and dry Neuro:  Strength and sensation are intact Psych: euthymic mood, full affect  EKG:  EKG is not ordered today. Personal review of the ekg ordered shows sinus rhythm, rate 82  SVT with rate 191  Recent Labs: 09/15/2016: Hemoglobin 10.7 11/13/2016: ALT 13; BUN 49; Creatinine, Ser 8.36;  Platelets 205; Potassium 4.5; Sodium 140; TSH 1.11    Lipid Panel  Component Value Date/Time   CHOL 130 11/13/2016 0945   TRIG 102.0 11/13/2016 0945   HDL 41.40 11/13/2016 0945   CHOLHDL 3 11/13/2016 0945   VLDL 20.4 11/13/2016 0945   LDLCALC 68 11/13/2016 0945     Wt Readings from Last 3 Encounters:  12/27/16 299 lb (135.6 kg)  11/22/16 294 lb (133.4 kg)  11/13/16 298 lb (135.2 kg)      Other studies Reviewed: Additional studies/ records that were reviewed today include: Stress TTE 10/20/15  Review of the above records today demonstrates:  - Stress ECG conclusions: There were no stress arrhythmias or   conduction abnormalities. 1 mm ST segment depression with   inverted [place] in recovery. - Staged echo: There was no echocardiographic evidence for   stress-induced ischemia. - Impressions: [place] study with normal echo images and positive   ECG in recovery Consider f/u cardiac CT or other imaging  Impressions:  - [place] study with normal echo images and positive ECG in   recovery Consider f/u cardiac CT or other imaging   ASSESSMENT AND PLAN:  1.  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 Had ablation attempt 11/16/16 but was unable to induce arhythmia. He is tolerating his metoprolol well not having any further arrhythmia. I did tell him that if he has further tachycardia, that we could try adjusting medications versus repeat ablation without anesthesia.  2. End-stage renal disease: Dialysis Monday Wednesday Friday. He will have dialysis after ablation.  3. Hypertension: Mildly elevated but overall well controlled. No medical changes at this time.  4. Obstructive sleep apnea: On BiPAP. Encouraged weight loss. He will likely need to wear his BiPAP during the ablation procedure.  5. Chest pain: Has had a negative stress echo in the past. Per history unlikely to be cardiac in nature.  Current medicines are reviewed at  length with the patient today.   The patient does not have concerns regarding his medicines.  The following changes were made today:  none  Labs/ tests ordered today include:  No orders of the defined types were placed in this encounter.    Disposition:   FU with Will Camnitz 6 months  Signed, Will Meredith Leeds, MD  12/27/2016 3:56 PM     [facility] [address] [place] [address] [phone] (office) [phone] (fax)

## 2016-12-27 NOTE — Patient Instructions (Signed)
Medication Instructions:  Your physician recommends that you continue on your current medications as directed. Please refer to the Current Medication list given to you today.  If you need a refill on your cardiac medications before your next appointment, please call your pharmacy.   Labwork: None ordered  Testing/Procedures: None ordered  Follow-Up: Your physician wants you to follow-up in: 6 months with Dr. Camnitz.  You will receive a reminder letter in the mail two months in advance. If you don't receive a letter, please call our office to schedule the follow-up appointment.  Thank you for choosing [facility]!!   Mack Alvidrez, RN [phone]         

## 2016-12-28 DIAGNOSIS — N2581 Secondary hyperparathyroidism of renal origin: Secondary | ICD-10-CM | POA: Diagnosis not present

## 2016-12-28 DIAGNOSIS — D509 Iron deficiency anemia, unspecified: Secondary | ICD-10-CM | POA: Diagnosis not present

## 2016-12-28 DIAGNOSIS — D631 Anemia in chronic kidney disease: Secondary | ICD-10-CM | POA: Diagnosis not present

## 2016-12-28 DIAGNOSIS — E1129 Type 2 diabetes mellitus with other diabetic kidney complication: Secondary | ICD-10-CM | POA: Diagnosis not present

## 2016-12-28 DIAGNOSIS — N186 End stage renal disease: Secondary | ICD-10-CM | POA: Diagnosis not present

## 2016-12-31 DIAGNOSIS — E1129 Type 2 diabetes mellitus with other diabetic kidney complication: Secondary | ICD-10-CM | POA: Diagnosis not present

## 2016-12-31 DIAGNOSIS — D631 Anemia in chronic kidney disease: Secondary | ICD-10-CM | POA: Diagnosis not present

## 2016-12-31 DIAGNOSIS — N186 End stage renal disease: Secondary | ICD-10-CM | POA: Diagnosis not present

## 2016-12-31 DIAGNOSIS — N2581 Secondary hyperparathyroidism of renal origin: Secondary | ICD-10-CM | POA: Diagnosis not present

## 2016-12-31 DIAGNOSIS — D509 Iron deficiency anemia, unspecified: Secondary | ICD-10-CM | POA: Diagnosis not present

## 2017-01-02 DIAGNOSIS — N186 End stage renal disease: Secondary | ICD-10-CM | POA: Diagnosis not present

## 2017-01-02 DIAGNOSIS — E1129 Type 2 diabetes mellitus with other diabetic kidney complication: Secondary | ICD-10-CM | POA: Diagnosis not present

## 2017-01-02 DIAGNOSIS — D509 Iron deficiency anemia, unspecified: Secondary | ICD-10-CM | POA: Diagnosis not present

## 2017-01-02 DIAGNOSIS — N2581 Secondary hyperparathyroidism of renal origin: Secondary | ICD-10-CM | POA: Diagnosis not present

## 2017-01-02 DIAGNOSIS — D631 Anemia in chronic kidney disease: Secondary | ICD-10-CM | POA: Diagnosis not present

## 2017-01-04 DIAGNOSIS — D631 Anemia in chronic kidney disease: Secondary | ICD-10-CM | POA: Diagnosis not present

## 2017-01-04 DIAGNOSIS — D509 Iron deficiency anemia, unspecified: Secondary | ICD-10-CM | POA: Diagnosis not present

## 2017-01-04 DIAGNOSIS — N2581 Secondary hyperparathyroidism of renal origin: Secondary | ICD-10-CM | POA: Diagnosis not present

## 2017-01-04 DIAGNOSIS — E1129 Type 2 diabetes mellitus with other diabetic kidney complication: Secondary | ICD-10-CM | POA: Diagnosis not present

## 2017-01-04 DIAGNOSIS — N186 End stage renal disease: Secondary | ICD-10-CM | POA: Diagnosis not present

## 2017-01-05 DIAGNOSIS — N186 End stage renal disease: Secondary | ICD-10-CM | POA: Diagnosis not present

## 2017-01-05 DIAGNOSIS — Z992 Dependence on renal dialysis: Secondary | ICD-10-CM | POA: Diagnosis not present

## 2017-01-05 DIAGNOSIS — E1129 Type 2 diabetes mellitus with other diabetic kidney complication: Secondary | ICD-10-CM | POA: Diagnosis not present

## 2017-01-07 DIAGNOSIS — E1129 Type 2 diabetes mellitus with other diabetic kidney complication: Secondary | ICD-10-CM | POA: Diagnosis not present

## 2017-01-07 DIAGNOSIS — D509 Iron deficiency anemia, unspecified: Secondary | ICD-10-CM | POA: Diagnosis not present

## 2017-01-07 DIAGNOSIS — N186 End stage renal disease: Secondary | ICD-10-CM | POA: Diagnosis not present

## 2017-01-07 DIAGNOSIS — N2581 Secondary hyperparathyroidism of renal origin: Secondary | ICD-10-CM | POA: Diagnosis not present

## 2017-01-07 DIAGNOSIS — D631 Anemia in chronic kidney disease: Secondary | ICD-10-CM | POA: Diagnosis not present

## 2017-01-09 DIAGNOSIS — N2581 Secondary hyperparathyroidism of renal origin: Secondary | ICD-10-CM | POA: Diagnosis not present

## 2017-01-09 DIAGNOSIS — D631 Anemia in chronic kidney disease: Secondary | ICD-10-CM | POA: Diagnosis not present

## 2017-01-09 DIAGNOSIS — E1129 Type 2 diabetes mellitus with other diabetic kidney complication: Secondary | ICD-10-CM | POA: Diagnosis not present

## 2017-01-09 DIAGNOSIS — N186 End stage renal disease: Secondary | ICD-10-CM | POA: Diagnosis not present

## 2017-01-09 DIAGNOSIS — D509 Iron deficiency anemia, unspecified: Secondary | ICD-10-CM | POA: Diagnosis not present

## 2017-01-11 DIAGNOSIS — E1129 Type 2 diabetes mellitus with other diabetic kidney complication: Secondary | ICD-10-CM | POA: Diagnosis not present

## 2017-01-11 DIAGNOSIS — N186 End stage renal disease: Secondary | ICD-10-CM | POA: Diagnosis not present

## 2017-01-11 DIAGNOSIS — D509 Iron deficiency anemia, unspecified: Secondary | ICD-10-CM | POA: Diagnosis not present

## 2017-01-11 DIAGNOSIS — D631 Anemia in chronic kidney disease: Secondary | ICD-10-CM | POA: Diagnosis not present

## 2017-01-11 DIAGNOSIS — N2581 Secondary hyperparathyroidism of renal origin: Secondary | ICD-10-CM | POA: Diagnosis not present

## 2017-01-14 DIAGNOSIS — D631 Anemia in chronic kidney disease: Secondary | ICD-10-CM | POA: Diagnosis not present

## 2017-01-14 DIAGNOSIS — N186 End stage renal disease: Secondary | ICD-10-CM | POA: Diagnosis not present

## 2017-01-14 DIAGNOSIS — D509 Iron deficiency anemia, unspecified: Secondary | ICD-10-CM | POA: Diagnosis not present

## 2017-01-14 DIAGNOSIS — E1129 Type 2 diabetes mellitus with other diabetic kidney complication: Secondary | ICD-10-CM | POA: Diagnosis not present

## 2017-01-14 DIAGNOSIS — N2581 Secondary hyperparathyroidism of renal origin: Secondary | ICD-10-CM | POA: Diagnosis not present

## 2017-01-16 DIAGNOSIS — D631 Anemia in chronic kidney disease: Secondary | ICD-10-CM | POA: Diagnosis not present

## 2017-01-16 DIAGNOSIS — N2581 Secondary hyperparathyroidism of renal origin: Secondary | ICD-10-CM | POA: Diagnosis not present

## 2017-01-16 DIAGNOSIS — D509 Iron deficiency anemia, unspecified: Secondary | ICD-10-CM | POA: Diagnosis not present

## 2017-01-16 DIAGNOSIS — N186 End stage renal disease: Secondary | ICD-10-CM | POA: Diagnosis not present

## 2017-01-16 DIAGNOSIS — E1129 Type 2 diabetes mellitus with other diabetic kidney complication: Secondary | ICD-10-CM | POA: Diagnosis not present

## 2017-01-17 ENCOUNTER — Telehealth: Payer: Self-pay | Admitting: Cardiology

## 2017-01-17 NOTE — Telephone Encounter (Signed)
New Message     Pt c/o medication issue:  1. Name of Medication:  metoprolol (LOPRESSOR) 50 MG tablet Take 1 tablet (50 mg total) by mouth 2 (two) times daily     2. How are you currently taking this medication (dosage and times per day)? As prescribed  3. Are you having a reaction (difficulty breathing--STAT)? no 4. What is your medication issue? Low bp when on dialysis

## 2017-01-18 DIAGNOSIS — N186 End stage renal disease: Secondary | ICD-10-CM | POA: Diagnosis not present

## 2017-01-18 DIAGNOSIS — D509 Iron deficiency anemia, unspecified: Secondary | ICD-10-CM | POA: Diagnosis not present

## 2017-01-18 DIAGNOSIS — D631 Anemia in chronic kidney disease: Secondary | ICD-10-CM | POA: Diagnosis not present

## 2017-01-18 DIAGNOSIS — E1129 Type 2 diabetes mellitus with other diabetic kidney complication: Secondary | ICD-10-CM | POA: Diagnosis not present

## 2017-01-18 DIAGNOSIS — N2581 Secondary hyperparathyroidism of renal origin: Secondary | ICD-10-CM | POA: Diagnosis not present

## 2017-01-18 NOTE — Telephone Encounter (Signed)
Reports 1 - 1 1/2 before end of dialysis, BP dropping to as low as 70/40. Pt would like Dr. Curt Bears to discuss with Dr. Jimmy Footman (kidney doctor) about Metoprolol role and if needs to be decreased/stopped on dialysis days. Pt understands I will review w/ Camnitz on Monday, when he returns to the office, and call pt to inform him of recommendation/s after two physicians speak. Pt is agreeable to plan.  Juluis Rainier - scheduled for kidney transplant 5/21!!)

## 2017-01-18 NOTE — Telephone Encounter (Signed)
Follow up   Pt is calling returning call to [facility]. He said please call.

## 2017-01-21 DIAGNOSIS — D509 Iron deficiency anemia, unspecified: Secondary | ICD-10-CM | POA: Diagnosis not present

## 2017-01-21 DIAGNOSIS — N2581 Secondary hyperparathyroidism of renal origin: Secondary | ICD-10-CM | POA: Diagnosis not present

## 2017-01-21 DIAGNOSIS — E1129 Type 2 diabetes mellitus with other diabetic kidney complication: Secondary | ICD-10-CM | POA: Diagnosis not present

## 2017-01-21 DIAGNOSIS — N186 End stage renal disease: Secondary | ICD-10-CM | POA: Diagnosis not present

## 2017-01-21 DIAGNOSIS — D631 Anemia in chronic kidney disease: Secondary | ICD-10-CM | POA: Diagnosis not present

## 2017-01-21 NOTE — Telephone Encounter (Signed)
Informed pt that I have spoke with Dr. Curt Bears who feels pt ok to hold for dialysis days.  Pt understands I will forward information to Dr Deterding for his review and advisement. Informed pt that I would call him once I have heard back from Deterding. Patient verbalized understanding and agreeable to plan.

## 2017-01-21 NOTE — Telephone Encounter (Signed)
Follow up    Pt is calling to speak Robert [name].

## 2017-01-23 DIAGNOSIS — E1129 Type 2 diabetes mellitus with other diabetic kidney complication: Secondary | ICD-10-CM | POA: Diagnosis not present

## 2017-01-23 DIAGNOSIS — D509 Iron deficiency anemia, unspecified: Secondary | ICD-10-CM | POA: Diagnosis not present

## 2017-01-23 DIAGNOSIS — N2581 Secondary hyperparathyroidism of renal origin: Secondary | ICD-10-CM | POA: Diagnosis not present

## 2017-01-23 DIAGNOSIS — D631 Anemia in chronic kidney disease: Secondary | ICD-10-CM | POA: Diagnosis not present

## 2017-01-23 DIAGNOSIS — N186 End stage renal disease: Secondary | ICD-10-CM | POA: Diagnosis not present

## 2017-01-23 NOTE — Telephone Encounter (Signed)
Pt would like for Dr. Jimmy Footman and Dr. Curt Bears to speak personally about his medication/case.   Pt aware I would forward this note to [facility] asking Dr. Jimmy Footman to call Dr. Curt Bears to discuss.  Will include Dr. Curt Bears contact info on fax cover sheet.

## 2017-01-24 ENCOUNTER — Encounter: Payer: Self-pay | Admitting: Endocrinology

## 2017-01-24 NOTE — Telephone Encounter (Signed)
Dr. Jimmy Footman -- please let me know Lopressor instructions you gave to patient, so that we know what/when he is taking going forward.   (pt couldn't remember but had written down at home).  I informed pt that Dr. Curt Bears is comfortable with whatever decision you make on Lopressor and we could re-evaluate post transplant.  Thanks

## 2017-01-25 DIAGNOSIS — N186 End stage renal disease: Secondary | ICD-10-CM | POA: Diagnosis not present

## 2017-01-25 DIAGNOSIS — D631 Anemia in chronic kidney disease: Secondary | ICD-10-CM | POA: Diagnosis not present

## 2017-01-25 DIAGNOSIS — E1129 Type 2 diabetes mellitus with other diabetic kidney complication: Secondary | ICD-10-CM | POA: Diagnosis not present

## 2017-01-25 DIAGNOSIS — Z992 Dependence on renal dialysis: Secondary | ICD-10-CM | POA: Diagnosis not present

## 2017-01-25 DIAGNOSIS — N2581 Secondary hyperparathyroidism of renal origin: Secondary | ICD-10-CM | POA: Diagnosis not present

## 2017-01-25 DIAGNOSIS — I871 Compression of vein: Secondary | ICD-10-CM | POA: Diagnosis not present

## 2017-01-25 DIAGNOSIS — T82858A Stenosis of vascular prosthetic devices, implants and grafts, initial encounter: Secondary | ICD-10-CM | POA: Diagnosis not present

## 2017-01-25 DIAGNOSIS — D509 Iron deficiency anemia, unspecified: Secondary | ICD-10-CM | POA: Diagnosis not present

## 2017-01-28 DIAGNOSIS — D631 Anemia in chronic kidney disease: Secondary | ICD-10-CM | POA: Diagnosis not present

## 2017-01-28 DIAGNOSIS — E1129 Type 2 diabetes mellitus with other diabetic kidney complication: Secondary | ICD-10-CM | POA: Diagnosis not present

## 2017-01-28 DIAGNOSIS — N2581 Secondary hyperparathyroidism of renal origin: Secondary | ICD-10-CM | POA: Diagnosis not present

## 2017-01-28 DIAGNOSIS — N186 End stage renal disease: Secondary | ICD-10-CM | POA: Diagnosis not present

## 2017-01-28 DIAGNOSIS — D509 Iron deficiency anemia, unspecified: Secondary | ICD-10-CM | POA: Diagnosis not present

## 2017-01-30 ENCOUNTER — Encounter: Payer: Self-pay | Admitting: Nephrology

## 2017-01-30 DIAGNOSIS — D509 Iron deficiency anemia, unspecified: Secondary | ICD-10-CM | POA: Diagnosis not present

## 2017-01-30 DIAGNOSIS — N2581 Secondary hyperparathyroidism of renal origin: Secondary | ICD-10-CM | POA: Diagnosis not present

## 2017-01-30 DIAGNOSIS — D631 Anemia in chronic kidney disease: Secondary | ICD-10-CM | POA: Diagnosis not present

## 2017-01-30 DIAGNOSIS — N186 End stage renal disease: Secondary | ICD-10-CM | POA: Diagnosis not present

## 2017-01-30 DIAGNOSIS — E1129 Type 2 diabetes mellitus with other diabetic kidney complication: Secondary | ICD-10-CM | POA: Diagnosis not present

## 2017-01-30 NOTE — Telephone Encounter (Signed)
Metoprolol was reduced to 25mg  po bid ,not to take on dialysis days until after dialysis

## 2017-01-31 MED ORDER — METOPROLOL TARTRATE 25 MG PO TABS: 25.0000 mg | ORAL_TABLET | Freq: Two times a day (BID) | ORAL | 3 refills | Status: DC

## 2017-02-01 DIAGNOSIS — Z7682 Awaiting organ transplant status: Secondary | ICD-10-CM | POA: Diagnosis not present

## 2017-02-02 DIAGNOSIS — D509 Iron deficiency anemia, unspecified: Secondary | ICD-10-CM | POA: Diagnosis not present

## 2017-02-02 DIAGNOSIS — N2581 Secondary hyperparathyroidism of renal origin: Secondary | ICD-10-CM | POA: Diagnosis not present

## 2017-02-02 DIAGNOSIS — D631 Anemia in chronic kidney disease: Secondary | ICD-10-CM | POA: Diagnosis not present

## 2017-02-02 DIAGNOSIS — E1129 Type 2 diabetes mellitus with other diabetic kidney complication: Secondary | ICD-10-CM | POA: Diagnosis not present

## 2017-02-02 DIAGNOSIS — N186 End stage renal disease: Secondary | ICD-10-CM | POA: Diagnosis not present

## 2017-02-04 DIAGNOSIS — N186 End stage renal disease: Secondary | ICD-10-CM | POA: Diagnosis not present

## 2017-02-04 DIAGNOSIS — E1129 Type 2 diabetes mellitus with other diabetic kidney complication: Secondary | ICD-10-CM | POA: Diagnosis not present

## 2017-02-04 DIAGNOSIS — D509 Iron deficiency anemia, unspecified: Secondary | ICD-10-CM | POA: Diagnosis not present

## 2017-02-04 DIAGNOSIS — D631 Anemia in chronic kidney disease: Secondary | ICD-10-CM | POA: Diagnosis not present

## 2017-02-04 DIAGNOSIS — N2581 Secondary hyperparathyroidism of renal origin: Secondary | ICD-10-CM | POA: Diagnosis not present

## 2017-02-04 DIAGNOSIS — Z992 Dependence on renal dialysis: Secondary | ICD-10-CM | POA: Diagnosis not present

## 2017-02-05 DIAGNOSIS — E1129 Type 2 diabetes mellitus with other diabetic kidney complication: Secondary | ICD-10-CM | POA: Diagnosis not present

## 2017-02-05 DIAGNOSIS — N186 End stage renal disease: Secondary | ICD-10-CM | POA: Diagnosis not present

## 2017-02-05 DIAGNOSIS — Z992 Dependence on renal dialysis: Secondary | ICD-10-CM | POA: Diagnosis not present

## 2017-02-06 DIAGNOSIS — N2581 Secondary hyperparathyroidism of renal origin: Secondary | ICD-10-CM | POA: Diagnosis not present

## 2017-02-06 DIAGNOSIS — E1129 Type 2 diabetes mellitus with other diabetic kidney complication: Secondary | ICD-10-CM | POA: Diagnosis not present

## 2017-02-06 DIAGNOSIS — D631 Anemia in chronic kidney disease: Secondary | ICD-10-CM | POA: Diagnosis not present

## 2017-02-06 DIAGNOSIS — N186 End stage renal disease: Secondary | ICD-10-CM | POA: Diagnosis not present

## 2017-02-06 DIAGNOSIS — D509 Iron deficiency anemia, unspecified: Secondary | ICD-10-CM | POA: Diagnosis not present

## 2017-02-08 DIAGNOSIS — N2581 Secondary hyperparathyroidism of renal origin: Secondary | ICD-10-CM | POA: Diagnosis not present

## 2017-02-08 DIAGNOSIS — D631 Anemia in chronic kidney disease: Secondary | ICD-10-CM | POA: Diagnosis not present

## 2017-02-08 DIAGNOSIS — E1129 Type 2 diabetes mellitus with other diabetic kidney complication: Secondary | ICD-10-CM | POA: Diagnosis not present

## 2017-02-08 DIAGNOSIS — D509 Iron deficiency anemia, unspecified: Secondary | ICD-10-CM | POA: Diagnosis not present

## 2017-02-08 DIAGNOSIS — N186 End stage renal disease: Secondary | ICD-10-CM | POA: Diagnosis not present

## 2017-02-11 DIAGNOSIS — D631 Anemia in chronic kidney disease: Secondary | ICD-10-CM | POA: Diagnosis not present

## 2017-02-11 DIAGNOSIS — E1129 Type 2 diabetes mellitus with other diabetic kidney complication: Secondary | ICD-10-CM | POA: Diagnosis not present

## 2017-02-11 DIAGNOSIS — D509 Iron deficiency anemia, unspecified: Secondary | ICD-10-CM | POA: Diagnosis not present

## 2017-02-11 DIAGNOSIS — N2581 Secondary hyperparathyroidism of renal origin: Secondary | ICD-10-CM | POA: Diagnosis not present

## 2017-02-11 DIAGNOSIS — N186 End stage renal disease: Secondary | ICD-10-CM | POA: Diagnosis not present

## 2017-02-12 DIAGNOSIS — Z1159 Encounter for screening for other viral diseases: Secondary | ICD-10-CM | POA: Diagnosis not present

## 2017-02-12 DIAGNOSIS — G4733 Obstructive sleep apnea (adult) (pediatric): Secondary | ICD-10-CM | POA: Diagnosis not present

## 2017-02-12 DIAGNOSIS — Z114 Encounter for screening for human immunodeficiency virus [HIV]: Secondary | ICD-10-CM | POA: Diagnosis not present

## 2017-02-12 DIAGNOSIS — N186 End stage renal disease: Secondary | ICD-10-CM | POA: Diagnosis not present

## 2017-02-12 DIAGNOSIS — Z01818 Encounter for other preprocedural examination: Secondary | ICD-10-CM | POA: Diagnosis not present

## 2017-02-12 DIAGNOSIS — E039 Hypothyroidism, unspecified: Secondary | ICD-10-CM | POA: Diagnosis not present

## 2017-02-12 DIAGNOSIS — Z992 Dependence on renal dialysis: Secondary | ICD-10-CM | POA: Diagnosis not present

## 2017-02-12 DIAGNOSIS — I120 Hypertensive chronic kidney disease with stage 5 chronic kidney disease or end stage renal disease: Secondary | ICD-10-CM | POA: Diagnosis not present

## 2017-02-12 DIAGNOSIS — Z794 Long term (current) use of insulin: Secondary | ICD-10-CM | POA: Diagnosis not present

## 2017-02-12 DIAGNOSIS — I4581 Long QT syndrome: Secondary | ICD-10-CM | POA: Diagnosis not present

## 2017-02-12 DIAGNOSIS — E1122 Type 2 diabetes mellitus with diabetic chronic kidney disease: Secondary | ICD-10-CM | POA: Diagnosis not present

## 2017-02-13 ENCOUNTER — Encounter (HOSPITAL_BASED_OUTPATIENT_CLINIC_OR_DEPARTMENT_OTHER): Payer: Self-pay

## 2017-02-13 ENCOUNTER — Ambulatory Visit (INDEPENDENT_AMBULATORY_CARE_PROVIDER_SITE_OTHER): Payer: Medicare Other | Admitting: Cardiovascular Disease

## 2017-02-13 ENCOUNTER — Telehealth: Payer: Self-pay | Admitting: Cardiovascular Disease

## 2017-02-13 ENCOUNTER — Encounter: Payer: Self-pay | Admitting: Cardiovascular Disease

## 2017-02-13 ENCOUNTER — Emergency Department (HOSPITAL_BASED_OUTPATIENT_CLINIC_OR_DEPARTMENT_OTHER)
Admission: EM | Admit: 2017-02-13 | Discharge: 2017-02-14 | Disposition: A | Discharge status: Home / Self Care | Payer: No Typology Code available for payment source | Attending: Emergency Medicine | Admitting: Emergency Medicine

## 2017-02-13 ENCOUNTER — Other Ambulatory Visit: Payer: Self-pay | Admitting: Endocrinology

## 2017-02-13 VITALS — BP 148/87 | HR 89 | Ht 72.0 in | Wt 293.0 lb

## 2017-02-13 DIAGNOSIS — Z7982 Long term (current) use of aspirin: Secondary | ICD-10-CM | POA: Insufficient documentation

## 2017-02-13 DIAGNOSIS — Y998 Other external cause status: Secondary | ICD-10-CM | POA: Diagnosis not present

## 2017-02-13 DIAGNOSIS — E1140 Type 2 diabetes mellitus with diabetic neuropathy, unspecified: Secondary | ICD-10-CM | POA: Insufficient documentation

## 2017-02-13 DIAGNOSIS — E118 Type 2 diabetes mellitus with unspecified complications: Secondary | ICD-10-CM | POA: Diagnosis not present

## 2017-02-13 DIAGNOSIS — E039 Hypothyroidism, unspecified: Secondary | ICD-10-CM

## 2017-02-13 DIAGNOSIS — N186 End stage renal disease: Secondary | ICD-10-CM | POA: Insufficient documentation

## 2017-02-13 DIAGNOSIS — Z794 Long term (current) use of insulin: Secondary | ICD-10-CM

## 2017-02-13 DIAGNOSIS — I1311 Hypertensive heart and chronic kidney disease without heart failure, with stage 5 chronic kidney disease, or end stage renal disease: Secondary | ICD-10-CM | POA: Insufficient documentation

## 2017-02-13 DIAGNOSIS — S161XXA Strain of muscle, fascia and tendon at neck level, initial encounter: Secondary | ICD-10-CM

## 2017-02-13 DIAGNOSIS — Y929 Unspecified place or not applicable: Secondary | ICD-10-CM | POA: Diagnosis not present

## 2017-02-13 DIAGNOSIS — I10 Essential (primary) hypertension: Secondary | ICD-10-CM

## 2017-02-13 DIAGNOSIS — E1129 Type 2 diabetes mellitus with other diabetic kidney complication: Secondary | ICD-10-CM | POA: Diagnosis not present

## 2017-02-13 DIAGNOSIS — G4733 Obstructive sleep apnea (adult) (pediatric): Secondary | ICD-10-CM | POA: Diagnosis not present

## 2017-02-13 DIAGNOSIS — Y939 Activity, unspecified: Secondary | ICD-10-CM | POA: Insufficient documentation

## 2017-02-13 DIAGNOSIS — N2581 Secondary hyperparathyroidism of renal origin: Secondary | ICD-10-CM | POA: Diagnosis not present

## 2017-02-13 DIAGNOSIS — E785 Hyperlipidemia, unspecified: Secondary | ICD-10-CM

## 2017-02-13 DIAGNOSIS — S199XXA Unspecified injury of neck, initial encounter: Secondary | ICD-10-CM | POA: Diagnosis not present

## 2017-02-13 DIAGNOSIS — Z992 Dependence on renal dialysis: Secondary | ICD-10-CM | POA: Insufficient documentation

## 2017-02-13 DIAGNOSIS — S4992XA Unspecified injury of left shoulder and upper arm, initial encounter: Secondary | ICD-10-CM | POA: Diagnosis not present

## 2017-02-13 DIAGNOSIS — I471 Supraventricular tachycardia: Secondary | ICD-10-CM

## 2017-02-13 DIAGNOSIS — D631 Anemia in chronic kidney disease: Secondary | ICD-10-CM | POA: Diagnosis not present

## 2017-02-13 DIAGNOSIS — Z9989 Dependence on other enabling machines and devices: Secondary | ICD-10-CM

## 2017-02-13 DIAGNOSIS — M25512 Pain in left shoulder: Secondary | ICD-10-CM | POA: Diagnosis not present

## 2017-02-13 DIAGNOSIS — F17290 Nicotine dependence, other tobacco product, uncomplicated: Secondary | ICD-10-CM | POA: Diagnosis not present

## 2017-02-13 DIAGNOSIS — M542 Cervicalgia: Secondary | ICD-10-CM | POA: Diagnosis not present

## 2017-02-13 DIAGNOSIS — D509 Iron deficiency anemia, unspecified: Secondary | ICD-10-CM | POA: Diagnosis not present

## 2017-02-13 NOTE — Telephone Encounter (Signed)
Phone call goes directly to VM, but VM box is full and I was unable to leave msg. Inquiry is regarding a BiPAP question, will cc Mariann Laster as fyi.
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
## 2017-02-13 NOTE — Telephone Encounter (Signed)
New message      Pt was seen today.  He has a question for the nurse regarding his bipap presciption.  If you can, please call before you leave today

## 2017-02-13 NOTE — Patient Instructions (Signed)
Your physician recommends that you schedule a follow-up appointment in: 4 months  

## 2017-02-13 NOTE — Progress Notes (Signed)
Patient ID: Robert [name], male   DOB: 23-Apr-1961, 56 y.o.   MRN: [id number]     HPI: Robert [name], is a 56 y.o. male who is a former patient of Dr. Terance Ice.  I initially saw him in October 2014 when he established care with me.  He presents for a 7 month follow-up evaluation.  Robert [name]  has a complex medical history that includes a congenital nonfunctioning left kidney for which he underwent left nephrectomy at age 53. He has a history of obesity, diabetes mellitus, and has been demonstrated to have proteinuria of his remaining single kidney, being status post open renal biopsy in [name] in November 2010. This revealed focal segmental glomerular sclerosis. At that time he was seen by Dr. Venita Sheffield in the nephrology department at [facility]. He sees Dr. Moshe Cipro in [place] for his kidney insufficiency and has been on ARB therapy in the past in an attempt to reduce proteinuria. He sees Dr. Dwyane Dee for his diabetes mellitus. He also has a history of hypothyroidism, hyperlipidemia, anxiety and depression. He has a history of prior supraventricular tachycardia requiring hospitalization in 2007 as was felt to be possibly AV nodal reentrant rhythm.  In addition, he has a history of complex sleep apnea initially diagnosed in 2007 with an AHI at that time of 56.7 per hour and during REM sleep this increased to 65.7 per hour. At that time he dropped his oxygen 84% and had loud snoring. He had been on BiPAP therapy. He underwent a 5 year followup split-night study in 2012 which again confirmed severe sleep apnea with an AHI of 60.7 on the baseline portion of the study. He was unable to achieve REM sleep at that time. Oxygen saturation during non-REM sleep at 83%. He was titrated utilizing BiPAP up to a setting of 16/12 cm water pressure. He does admit to continued residual daytime sleepiness despite using his BiPAP therapy.  An echo Doppler study in  August 2012 showed mild concentric LVH with normal systolic function and grade 1 diastolic dysfunction. He had trace mitral and tricuspid insufficiency as well as pulmonic insufficiency. A nuclear perfusion study in August 2012 showed normal perfusion.  [name], [name].  He is on the [facility] transplant list.  In January 2017 he had an stress echo at [facility] as part of his transplant evaluation.  I do not have the results of this evaluation.  He has had some difficulty with a steal syndrome in his right hand since he had AV fistulas inserted in the forearm and most recently now in the upper arm.  He continues to use BiPAP with 100% compliance.  He has a [employer] unit.  He obtains his CPAP supplies online because they are cheaper, but had seen [place] [facility].  He is using a Art therapist full face mask.  A download in the office from 05/28/2016 through 07/26/2016.  He is meeting compliance standards with usage at 85% usage stays greater than 4 hours at 77%.  He is averaging 6 hours and 43 minutes per night.  He is set at an IPAP pressure of 16 and EPAP pressure of 12.  His AHI was mildly increased at 6.5 with 5.2 per hour of apneic events and 1.3 of hypopnea events.  When I last saw him he had stopped taken Bystolic since his blood pressure had become low at the end of dialysis.  He had  been on 5 mg on nondialysis days.  He tells me his aspirin was discontinued because he was continuously having issues with bleeding following his dialysis when the catheter was removed.   Since I last saw him, he had an episode of SVT in December 2017, which was successfully aborted with vagal maneuvers.  In the emergency room, he had 2-3 more recurrences of SVT, of which, again, all aborted with vagal maneuvers.  He was started on verapamil and discharge from the hospital.  He has seen Dr. Elberta Fortis and had an EP study with attempted ablation, but apparently he was not able  to be induced and ablation was not performed.   He denies any recurrent episodes of SVT.  He tells me that he is scheduled to undergo renal transplantation with a living donor at [facility] on 02/25/2017.  He denies any episodes of chest pain.  He denies shortness of breath.  Remotely, he may of had some mild nonexertional musculoskeletal discomfort but has not experienced any of this recently.  Reportedly, his remote stress echo was negative for ischemia, although mild ST changes were noted in recovery.  He continues to be asymptomatic.  He continues to use BiPAP and admits to 1% compliance.  Not had a download since his last evaluation.  Review of that download demonstrated a significant mask leak.  We will contact [facility] and obtain a new download to see if if pressure adjustment is necessary.  We discussed strategies to reduce mask leak.  He may need a new mask.  He presents for evaluation  Past Medical History:  Diagnosis Date  . Anemia    low hgb at present  . Arthritis    FEET  . Blood transfusion without reported diagnosis 1974   kidney removed - L  . Cancer (HCC)    skin ca right shoulder, plastic dsyplasia, pre-Ca polpys removed on Colonoscopy- 07/2014  . Colon polyps 07/23/2014   Tubular adenomas x 5  . Constipation   . Diabetes mellitus without complication (HCC)    Type 2  . Difficult intubation    unsure of actual problem but it was during the January 28, 2015 procedure.  Marland Kitchen Dysrhythmia   . Eczema   . ESRD (end stage renal disease) (HCC)    hEMO mwf  . Headache(784.0)    slight dull h/a due kidney failure  . Hyperlipidemia   . Hypertension    SVT h/o , followed by Dr. Tresa Endo  . Hypothyroidism   . Macular degeneration   . Neuropathy    right hand and both feet  . PAT (paroxysmal atrial tachycardia) (HCC)   . PSVT (paroxysmal supraventricular tachycardia) (HCC)   . SBO (small bowel obstruction) (HCC)   . Shortness of breath    with exertion  . Sleep apnea  06/18/11   split-night sleep study- [facility]., BiPAP- 13-16  . Thyroid disease   . Umbilical hernia    will repair with thi surgery    Past Surgical History:  Procedure Laterality Date  . AV FISTULA PLACEMENT Right 02/25/2015   Procedure: RIGHT ARTERIOVENOUS (AV) FISTULA CREATION;  Surgeon: Nada Libman, MD;  Location: [facility];  Service: Vascular;  Laterality: Right;  . AV FISTULA PLACEMENT Right 09/15/2015   Procedure: ARTERIOVENOUS (AV) FISTULA CREATION- RIGHT ARM;  Surgeon: Nada Libman, MD;  Location: [facility] OR;  Service: Vascular;  Laterality: Right;  . CAPD INSERTION N/A 05/18/2014   Procedure: LAPAROSCOPIC INSERTION CONTINUOUS AMBULATORY PERITONEAL  DIALYSIS  (CAPD) CATHETER;  Surgeon: Ralene Ok, MD;  Location: [place];  Service: General;  Laterality: N/A;  . COLONOSCOPY    . COLONOSCOPY WITH PROPOFOL N/A 07/12/2016   Procedure: COLONOSCOPY WITH PROPOFOL;  Surgeon: [name], MD;  Location: [facility];  Service: Endoscopy;  Laterality: N/A;  . declotting of fistula  08/2015  . ESOPHAGOGASTRODUODENOSCOPY (EGD) WITH PROPOFOL N/A 07/12/2016   Procedure: ESOPHAGOGASTRODUODENOSCOPY (EGD) WITH PROPOFOL;  Surgeon: [name], MD;  [facility];  Service: Endoscopy;  Laterality: N/A;  . FISTULA SUPERFICIALIZATION Right 02/07/2016   Procedure: RIIGHT UPPER ARM FISTULA SUPERFICIALIZATION;  Surgeon: Elam Dutch, MD;  Location: [place];  Service: Vascular;  Laterality: Right;  . IJ catheter insertion    . INSERTION OF DIALYSIS CATHETER Right 02/25/2015   Procedure: INSERTION OF RIGHT INTERNAL JUGULAR DIALYSIS CATHETER;  Surgeon: Serafina Mitchell, MD;  Location: [place];  Service: Vascular;  Laterality: Right;  . LAPAROSCOPIC REPOSITIONING CAPD CATHETER N/A 06/16/2014   Procedure: LAPAROSCOPIC REPOSITIONING CAPD CATHETER;  Surgeon: Ralene Ok, MD;  Location: [place];  Service: General;  Laterality: N/A;  . LAPAROSCOPY N/A 05/04/2015   Procedure:  LAPAROSCOPY DIAGNOSTIC LYSIS OF ADHESIONS;  Surgeon: Coralie Keens, MD;  Location: [place];  Service: General;  Laterality: N/A;  . MINOR REMOVAL OF PERITONEAL DIALYSIS CATHETER N/A 04/28/2015   Procedure:  REMOVAL OF PERITONEAL DIALYSIS CATHETER;  Surgeon: Coralie Keens, MD;  Location: [place];  Service: General;  Laterality: N/A;  . NEPHRECTOMY Left 1974  . RENAL BIOPSY Right 2012  . REVISON OF ARTERIOVENOUS FISTULA Right 05/26/2015   Procedure: SUPERFICIALIZATION OF ARTERIOVENOUS FISTULA WITH SIDE BRANCH LIGATIONS;  Surgeon: Serafina Mitchell, MD;  Location: [place];  Service: Vascular;  Laterality: Right;  . SKIN CANCER EXCISION     right shoulder  . SVT ABLATION N/A 11/23/2016   Procedure: SVT Ablation;  Surgeon: Will Meredith Leeds, MD;  Location: [place] [facility];  Service: Cardiovascular;  Laterality: N/A;  . UMBILICAL HERNIA REPAIR N/A 05/18/2014   Procedure: HERNIA REPAIR UMBILICAL ADULT;  Surgeon: Ralene Ok, MD;  Location: [place];  Service: General;  Laterality: N/A;  . UMBILICAL HERNIA REPAIR N/A 04/28/2015   Procedure: UMBILICAL HERNIA REPAIR WITH MESH;  Surgeon: Coralie Keens, MD;  Location: [place];  Service: General;  Laterality: N/A;   except left nephrectomy in 1974.  No Known Allergies  Current Outpatient Prescriptions  Medication Sig Dispense Refill  . aspirin EC 81 MG tablet Take 81 mg by mouth every morning.    . B-D UF III MINI PEN NEEDLES 31G X 5 MM MISC USE 4 PER DAY TO INJECT INSULIN. 400 each 1  . calcitRIOL (ROCALTROL) 0.25 MCG capsule Take 0.75 mcg by mouth every Monday, Wednesday, and Friday. DURING DIALYSIS    . cetirizine (ZYRTEC) 10 MG tablet Take 10 mg by mouth every morning.     . DULoxetine (CYMBALTA) 30 MG capsule Take 30 mg by mouth daily.     Marland Kitchen ethyl chloride spray USE PRE DIALYSIS AS NEEDED  10  . fluocinonide cream (LIDEX) 8.65 % Apply 1 application topically daily as needed (Skin inflammation (hand, thigh, back)).     Marland Kitchen gabapentin (NEURONTIN) 100  MG capsule Take 200 mg by mouth every morning.     Marland Kitchen HUMULIN R 500 UNIT/ML injection Using a U-100 1cc/31G/5/16" INSULIN SYRINGE, draw up and inject 5-18 units into the skin three times a day 30 minutes prior to each meal  1  .  lidocaine-prilocaine (EMLA) cream APPLY SMALL AMOUNT TO DIALYSIS SITE 30 -60 MINUTES PRIOR TO DIALYSIS 3X A WEEK  4  . metoprolol tartrate (LOPRESSOR) 25 MG tablet Take 1 tablet (25 mg total) by mouth 2 (two) times daily. Do not take on dialysis days until after dialysis 60 tablet 3  . polyethylene glycol (MIRALAX / GLYCOLAX) packet Take 17 g by mouth daily.    . rosuvastatin (CRESTOR) 10 MG tablet TAKE 1 TABLET BY MOUTH EVERY DAY 90 tablet 1  . SENSIPAR 60 MG tablet Take 60 mg by mouth daily.     . traMADol (ULTRAM) 50 MG tablet Take 1 tablet (50 mg total) by mouth every 6 (six) hours as needed. (Patient taking differently: Take 50-100 mg by mouth every 6 (six) hours as needed (for pain). ) 15 tablet 0  . traZODone (DESYREL) 50 MG tablet Take 50-100 mg by mouth at bedtime as needed for sleep.     . TRESIBA FLEXTOUCH 100 UNIT/ML SOPN FlexTouch Pen INJECT 30 UNITS INTO THE SKIN EVERY MORNING (Patient taking differently: INJECT 28 UNITS INTO THE SKIN EVERY MORNING) 15 pen 1  . zolpidem (AMBIEN) 10 MG tablet Take 10-20 mg by mouth at bedtime as needed for sleep.     Marland Kitchen levothyroxine (SYNTHROID, LEVOTHROID) 100 MCG tablet TAKE 1 TABLET (100 MCG TOTAL) BY MOUTH DAILY. 90 tablet 1   No current facility-administered medications for this visit.    Social history is notable in that he is married for 29 years. His 3 children. He has a degree as a chemistry. 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 He does drink occasional alcohol. He has smoked cigars.  ROS General: Negative; No fevers, chills, or night sweats; positive for morbid obesity HEENT: Negative; No changes in vision or hearing, sinus congestion, difficulty swallowing Pulmonary: Negative; No cough, wheezing, shortness of  breath, hemoptysis Cardiovascular: Negative; No chest pain, presyncope, syncope, palpitations GI: Negative; No nausea, vomiting, diarrhea, or abdominal pain GU: Negative; No dysuria, hematuria, or difficulty voiding Renal: End-stage renal disease on dialysis on Monday, [name] Musculoskeletal: Negative; no myalgias, joint pain, or weakness Hematologic/Oncology: Negative; no easy bruising, bleeding Endocrine: Positive for diabetes and hypothyroidism Neuro: Negative; no changes in balance, headaches Skin: Negative; No rashes or skin lesions Psychiatric: Negative; No behavioral problems, depression Sleep: Positive for OSA on BiPAP; no daytime sleepiness, hypersomnolence, bruxism, restless legs, hypnogognic hallucinations, no cataplexy Other comprehensive 14 point system review is negative.  PE BP (!) 148/87   Pulse 89   Ht 6' (1.829 m)   Wt 293 lb (132.9 kg)   BMI 39.74 kg/m    Repeat blood pressure by me 120/78  Wt Readings from Last 3 Encounters:  02/13/17 292 lb 9 oz (132.7 kg)  02/13/17 293 lb (132.9 kg)  12/27/16 299 lb (135.6 kg)   General: Alert, oriented, no distress.;  Morbidly obese Skin: normal turgor, no rashes HEENT: Normocephalic, atraumatic. Pupils round and reactive; sclera anicteric; Fundi mild arteriolar narrowing without hemorrhages or exudates the Nose without nasal septal hypertrophy Mouth/Parynx benign; Mallinpatti scale 3 Neck: No JVD, no carotid bruits with normal carotid upstroke Lungs: clear to ausculatation and percussion; no wheezing or rales Heart: RRR, s1 s2 normal soft S4. 1/6 systolic murmur; no S3 gallop.  No rubs thrills or heaves.. Abdomen: Central adiposity ; soft, nontender; no hepatosplenomehaly, BS+; abdominal aorta nontender and not dilated by palpation. Pulses 2+; fistula in the right upper arm Extremities: Trace lower extremity edema; no clubbinbg cyanosis,  Homan's sign negative  Neurologic: grossly nonfocal Psychologic:  Normal affect and mood   ECG (independently read by me): Normal sinus rhythm at 89 bpm.  No STT changes .  PR interval 126 ms.  QTc interval 457 ms. Normal ECG.  October 2017 ECG (independently read by me): Sinus tachycardia 101 bpm.  April 2017 ECG (independently read by me): Sinus tachycardia 100 bpm.  2.  TC interval 45 ms.  November 2014 ECG: Normal sinus 34 beats per minute. PR interval 138 ms. QT interval 448 ms.  LABS: BMP Latest Ref Rng & Units 11/13/2016 11/13/2016 09/15/2016  Glucose 70 - 99 mg/dL 108(H) 99 125(H)  BUN 6 - 24 mg/dL 49(H) - 32(H)  Creatinine 0.76 - 1.27 mg/dL 8.36(H) - 7.04(H)  BUN/Creat Ratio 9 - 20 6(L) - -  Sodium 134 - 144 mmol/L 140 - 138  Potassium 3.5 - 5.2 mmol/L 4.5 - 3.8  Chloride 96 - 106 mmol/L 95(L) - 95(L)  CO2 18 - 29 mmol/L 22 - 28  Calcium 8.7 - 10.2 mg/dL 8.4(L) - 9.0   Hepatic Function Latest Ref Rng & Units 11/13/2016 03/15/2016 05/13/2015  Total Protein 6.5 - 8.1 g/dL - - -  Albumin 3.5 - 5.0 g/dL - - 3.6  AST 15 - 41 U/L - - -  ALT 0 - 53 U/L 13 16 -  Alk Phosphatase 38 - 126 U/L - - -  Total Bilirubin 0.3 - 1.2 mg/dL - - -    CBC Latest Ref Rng & Units 11/13/2016 09/15/2016 09/14/2016  WBC 3.4 - 10.8 x10E3/uL 6.1 5.3 7.1  Hemoglobin 13.0 - 17.0 g/dL - 10.7(L) 11.2(L)  Hematocrit 37.5 - 51.0 % 31.7(L) 32.4(L) 33.2(L)  Platelets 150 - 379 x10E3/uL 205 181 197    Lab Results  Component Value Date   MCV 93 11/13/2016   MCV 95.0 09/15/2016   MCV 94.9 09/14/2016   Lab Results  Component Value Date   TSH 1.11 11/13/2016   Lab Results  Component Value Date   HGBA1C 5.8 11/13/2016   Lipid Panel     Component Value Date/Time   CHOL 130 11/13/2016 0945   TRIG 102.0 11/13/2016 0945   HDL 41.40 11/13/2016 0945   CHOLHDL 3 11/13/2016 0945   VLDL 20.4 11/13/2016 0945   LDLCALC 68 11/13/2016 0945    RADIOLOGY: No results found.  IMPRESSION:  1. Essential hypertension   2. End stage renal disease ([place])   3. SVT (supraventricular  tachycardia) ([place])   4. Hyperlipidemia with target LDL less than 70   5. OSA on CPAP   6. Type 2 diabetes mellitus with complication, with long-term current use of insulin ([place])   7. Hypothyroidism, unspecified type     ASSESSMENT AND PLAN: Robert [name] is a 56 year old Caucasian male is a history of morbid obesity, hypertension, diabetes mellitus, hyperlipidemia, and end-stage kidney disease on hemodialysis.  He has severe complex sleep apnea and has been on BiPAP therapy.  He admits to 100% compliance.  I was able to obtain a download over the past 60 days as well as 90 days.  He is meeting Medicare compliance.  His AHI 6. 2/90 days and 6.5 over the past 60 days.  When I last saw him I  recommended slight increase in his IPAP pressure to 17, EPAP pressure to 13.  He has not had a follow-up download.  At the time of his last evaluation, he had a significant mask leak, which may also contribute to his  AHI of 5.1.  Since I saw him, he has experienced recurrent episodes of SVT which aborted with vagal maneuvers.  An attempt at ablation was performed but no arrhythmia induction occurred with Isopril infusion and as result, ablation was not done.  He denies any recurrent episodes of tachycardia.  He is no longer on verapamil and is on metoprolol, tartrate 25 mg twice a day.  His ECG today is entirely normal.  He is not had any anginal symptomatology.  On his prior stress echo done at [facility].  Reportedly stress images were normal.  His last echo Doppler study and January 2018 showed an EF of 60-65% without regional wall motion abnormalities.  There was evidence for aortic sclerosis with mild-to-moderate aortic insufficiency.  His echo was not changed from previously.  There was moderate LVH.  Cardiovascular, he appears stable to undergo his planned renal transplantation with a living donor, which is tenably scheduled for 02/25/2017.  He continues to be on levothyroxine 100 g for his hypothyroidism.  He is on  Humulin insulin for his diabetes.  He continues to be on Crestor 10 mg for hyperlipidemia with target LDL less than 70 in this patient with diabetes.  On recent laboratory from her 2018, he was at target with an LDL of 68.  I will see him in 3-4 months for follow-up evaluation.  Time spent: 25 minutes Robert Sine, MD, [facility]  02/15/2017 11:49 AM
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
## 2017-02-14 ENCOUNTER — Emergency Department (HOSPITAL_BASED_OUTPATIENT_CLINIC_OR_DEPARTMENT_OTHER): Payer: No Typology Code available for payment source

## 2017-02-14 DIAGNOSIS — M542 Cervicalgia: Secondary | ICD-10-CM | POA: Diagnosis not present

## 2017-02-14 DIAGNOSIS — M25512 Pain in left shoulder: Secondary | ICD-10-CM | POA: Diagnosis not present

## 2017-02-14 DIAGNOSIS — S161XXA Strain of muscle, fascia and tendon at neck level, initial encounter: Secondary | ICD-10-CM | POA: Diagnosis not present

## 2017-02-14 DIAGNOSIS — S4992XA Unspecified injury of left shoulder and upper arm, initial encounter: Secondary | ICD-10-CM | POA: Diagnosis not present

## 2017-02-14 DIAGNOSIS — S199XXA Unspecified injury of neck, initial encounter: Secondary | ICD-10-CM | POA: Diagnosis not present

## 2017-02-14 NOTE — Telephone Encounter (Signed)
Spoke with patient this morning. BIPAP download order sent to [facility]. Copy of the order also left at the front desk for patient to pick up and take with him per his request In case they don't have it when he gets there.

## 2017-02-15 DIAGNOSIS — E1129 Type 2 diabetes mellitus with other diabetic kidney complication: Secondary | ICD-10-CM | POA: Diagnosis not present

## 2017-02-15 DIAGNOSIS — N186 End stage renal disease: Secondary | ICD-10-CM | POA: Diagnosis not present

## 2017-02-15 DIAGNOSIS — D631 Anemia in chronic kidney disease: Secondary | ICD-10-CM | POA: Diagnosis not present

## 2017-02-15 DIAGNOSIS — D509 Iron deficiency anemia, unspecified: Secondary | ICD-10-CM | POA: Diagnosis not present

## 2017-02-15 DIAGNOSIS — N2581 Secondary hyperparathyroidism of renal origin: Secondary | ICD-10-CM | POA: Diagnosis not present

## 2017-02-18 DIAGNOSIS — D509 Iron deficiency anemia, unspecified: Secondary | ICD-10-CM | POA: Diagnosis not present

## 2017-02-18 DIAGNOSIS — D631 Anemia in chronic kidney disease: Secondary | ICD-10-CM | POA: Diagnosis not present

## 2017-02-18 DIAGNOSIS — E1129 Type 2 diabetes mellitus with other diabetic kidney complication: Secondary | ICD-10-CM | POA: Diagnosis not present

## 2017-02-18 DIAGNOSIS — N2581 Secondary hyperparathyroidism of renal origin: Secondary | ICD-10-CM | POA: Diagnosis not present

## 2017-02-18 DIAGNOSIS — N186 End stage renal disease: Secondary | ICD-10-CM | POA: Diagnosis not present

## 2017-02-19 ENCOUNTER — Other Ambulatory Visit: Payer: BLUE CROSS/BLUE SHIELD

## 2017-02-20 DIAGNOSIS — Z794 Long term (current) use of insulin: Secondary | ICD-10-CM | POA: Diagnosis not present

## 2017-02-20 DIAGNOSIS — D509 Iron deficiency anemia, unspecified: Secondary | ICD-10-CM | POA: Diagnosis not present

## 2017-02-20 DIAGNOSIS — Z01810 Encounter for preprocedural cardiovascular examination: Secondary | ICD-10-CM | POA: Diagnosis not present

## 2017-02-20 DIAGNOSIS — N2581 Secondary hyperparathyroidism of renal origin: Secondary | ICD-10-CM | POA: Diagnosis not present

## 2017-02-20 DIAGNOSIS — N186 End stage renal disease: Secondary | ICD-10-CM | POA: Diagnosis not present

## 2017-02-20 DIAGNOSIS — I10 Essential (primary) hypertension: Secondary | ICD-10-CM | POA: Diagnosis not present

## 2017-02-20 DIAGNOSIS — E1129 Type 2 diabetes mellitus with other diabetic kidney complication: Secondary | ICD-10-CM | POA: Diagnosis not present

## 2017-02-20 DIAGNOSIS — E119 Type 2 diabetes mellitus without complications: Secondary | ICD-10-CM | POA: Diagnosis not present

## 2017-02-20 DIAGNOSIS — D631 Anemia in chronic kidney disease: Secondary | ICD-10-CM | POA: Diagnosis not present

## 2017-02-20 DIAGNOSIS — I471 Supraventricular tachycardia: Secondary | ICD-10-CM | POA: Diagnosis not present

## 2017-02-20 DIAGNOSIS — G4733 Obstructive sleep apnea (adult) (pediatric): Secondary | ICD-10-CM | POA: Diagnosis not present

## 2017-02-20 DIAGNOSIS — Z905 Acquired absence of kidney: Secondary | ICD-10-CM | POA: Diagnosis not present

## 2017-02-20 DIAGNOSIS — E785 Hyperlipidemia, unspecified: Secondary | ICD-10-CM | POA: Diagnosis not present

## 2017-02-20 DIAGNOSIS — E1322 Other specified diabetes mellitus with diabetic chronic kidney disease: Secondary | ICD-10-CM | POA: Diagnosis not present

## 2017-02-20 DIAGNOSIS — Z992 Dependence on renal dialysis: Secondary | ICD-10-CM | POA: Diagnosis not present

## 2017-02-20 DIAGNOSIS — E118 Type 2 diabetes mellitus with unspecified complications: Secondary | ICD-10-CM | POA: Diagnosis not present

## 2017-02-20 DIAGNOSIS — E669 Obesity, unspecified: Secondary | ICD-10-CM | POA: Diagnosis not present

## 2017-02-21 DIAGNOSIS — Z794 Long term (current) use of insulin: Secondary | ICD-10-CM | POA: Diagnosis not present

## 2017-02-21 DIAGNOSIS — I10 Essential (primary) hypertension: Secondary | ICD-10-CM | POA: Diagnosis not present

## 2017-02-21 DIAGNOSIS — Z992 Dependence on renal dialysis: Secondary | ICD-10-CM | POA: Diagnosis not present

## 2017-02-21 DIAGNOSIS — E118 Type 2 diabetes mellitus with unspecified complications: Secondary | ICD-10-CM | POA: Diagnosis not present

## 2017-02-21 DIAGNOSIS — E1322 Other specified diabetes mellitus with diabetic chronic kidney disease: Secondary | ICD-10-CM | POA: Diagnosis not present

## 2017-02-21 DIAGNOSIS — N186 End stage renal disease: Secondary | ICD-10-CM | POA: Diagnosis not present

## 2017-02-23 DIAGNOSIS — D631 Anemia in chronic kidney disease: Secondary | ICD-10-CM | POA: Diagnosis not present

## 2017-02-23 DIAGNOSIS — D509 Iron deficiency anemia, unspecified: Secondary | ICD-10-CM | POA: Diagnosis not present

## 2017-02-23 DIAGNOSIS — E1129 Type 2 diabetes mellitus with other diabetic kidney complication: Secondary | ICD-10-CM | POA: Diagnosis not present

## 2017-02-23 DIAGNOSIS — N186 End stage renal disease: Secondary | ICD-10-CM | POA: Diagnosis not present

## 2017-02-23 DIAGNOSIS — N2581 Secondary hyperparathyroidism of renal origin: Secondary | ICD-10-CM | POA: Diagnosis not present

## 2017-02-25 DIAGNOSIS — N186 End stage renal disease: Secondary | ICD-10-CM | POA: Diagnosis present

## 2017-02-25 DIAGNOSIS — Z79899 Other long term (current) drug therapy: Secondary | ICD-10-CM | POA: Diagnosis not present

## 2017-02-25 DIAGNOSIS — E875 Hyperkalemia: Secondary | ICD-10-CM | POA: Diagnosis present

## 2017-02-25 DIAGNOSIS — G4733 Obstructive sleep apnea (adult) (pediatric): Secondary | ICD-10-CM | POA: Diagnosis present

## 2017-02-25 DIAGNOSIS — I120 Hypertensive chronic kidney disease with stage 5 chronic kidney disease or end stage renal disease: Secondary | ICD-10-CM | POA: Diagnosis present

## 2017-02-25 DIAGNOSIS — E1121 Type 2 diabetes mellitus with diabetic nephropathy: Secondary | ICD-10-CM | POA: Diagnosis present

## 2017-02-25 DIAGNOSIS — Z87891 Personal history of nicotine dependence: Secondary | ICD-10-CM | POA: Diagnosis not present

## 2017-02-25 DIAGNOSIS — E039 Hypothyroidism, unspecified: Secondary | ICD-10-CM | POA: Diagnosis present

## 2017-02-25 DIAGNOSIS — Z6841 Body Mass Index (BMI) 40.0 and over, adult: Secondary | ICD-10-CM | POA: Diagnosis not present

## 2017-02-25 DIAGNOSIS — R739 Hyperglycemia, unspecified: Secondary | ICD-10-CM | POA: Diagnosis not present

## 2017-02-25 DIAGNOSIS — E1122 Type 2 diabetes mellitus with diabetic chronic kidney disease: Secondary | ICD-10-CM | POA: Diagnosis present

## 2017-02-25 DIAGNOSIS — Z940 Kidney transplant status: Secondary | ICD-10-CM | POA: Diagnosis not present

## 2017-02-25 DIAGNOSIS — Z524 Kidney donor: Secondary | ICD-10-CM | POA: Diagnosis not present

## 2017-02-25 DIAGNOSIS — Z298 Encounter for other specified prophylactic measures: Secondary | ICD-10-CM | POA: Diagnosis not present

## 2017-02-25 DIAGNOSIS — E038 Other specified hypothyroidism: Secondary | ICD-10-CM | POA: Diagnosis not present

## 2017-02-25 DIAGNOSIS — E785 Hyperlipidemia, unspecified: Secondary | ICD-10-CM | POA: Diagnosis present

## 2017-02-25 DIAGNOSIS — T380X5A Adverse effect of glucocorticoids and synthetic analogues, initial encounter: Secondary | ICD-10-CM | POA: Diagnosis present

## 2017-02-25 DIAGNOSIS — E1165 Type 2 diabetes mellitus with hyperglycemia: Secondary | ICD-10-CM | POA: Diagnosis present

## 2017-02-25 DIAGNOSIS — I351 Nonrheumatic aortic (valve) insufficiency: Secondary | ICD-10-CM | POA: Diagnosis present

## 2017-02-25 DIAGNOSIS — Z992 Dependence on renal dialysis: Secondary | ICD-10-CM | POA: Diagnosis not present

## 2017-02-25 DIAGNOSIS — Z905 Acquired absence of kidney: Secondary | ICD-10-CM | POA: Diagnosis not present

## 2017-02-25 DIAGNOSIS — R262 Difficulty in walking, not elsewhere classified: Secondary | ICD-10-CM | POA: Diagnosis not present

## 2017-02-25 DIAGNOSIS — Z794 Long term (current) use of insulin: Secondary | ICD-10-CM | POA: Diagnosis not present

## 2017-02-26 ENCOUNTER — Ambulatory Visit: Payer: BLUE CROSS/BLUE SHIELD | Admitting: Endocrinology

## 2017-02-26 DIAGNOSIS — Z940 Kidney transplant status: Secondary | ICD-10-CM

## 2017-02-26 DIAGNOSIS — D849 Immunodeficiency, unspecified: Secondary | ICD-10-CM

## 2017-02-26 DIAGNOSIS — Z792 Long term (current) use of antibiotics: Secondary | ICD-10-CM | POA: Insufficient documentation

## 2017-02-26 HISTORY — DX: Immunodeficiency, unspecified: D84.9

## 2017-02-26 HISTORY — DX: Kidney transplant status: Z94.0

## 2017-02-27 ENCOUNTER — Telehealth: Payer: Self-pay | Admitting: Endocrinology

## 2017-02-27 NOTE — Telephone Encounter (Signed)
Sharyn Lull, NP with [facility] is asking for this patient to be seen on 05/30 or 05/31. He had a kidney transplant and has follow ups with the transplant team on tues and Friday.   She asks that you call her pager at the number provided to advised.   The patient is currently in recovery at [facility]

## 2017-02-27 NOTE — Telephone Encounter (Signed)
Please schedule at the earliest available appointment, the schedule is currently very tight

## 2017-02-28 ENCOUNTER — Telehealth: Payer: Self-pay

## 2017-02-28 NOTE — Telephone Encounter (Signed)
Appt has been made by [facility]

## 2017-02-28 NOTE — Telephone Encounter (Signed)
[facility] called asking whether the appointment had been made, just asking for confirmation.

## 2017-02-28 NOTE — Telephone Encounter (Signed)
Error

## 2017-03-01 ENCOUNTER — Telehealth: Payer: Self-pay | Admitting: Endocrinology

## 2017-03-01 DIAGNOSIS — E1140 Type 2 diabetes mellitus with diabetic neuropathy, unspecified: Secondary | ICD-10-CM | POA: Insufficient documentation

## 2017-03-01 DIAGNOSIS — Z8639 Personal history of other endocrine, nutritional and metabolic disease: Secondary | ICD-10-CM | POA: Insufficient documentation

## 2017-03-01 HISTORY — DX: Type 2 diabetes mellitus with diabetic neuropathy, unspecified: E11.40

## 2017-03-01 NOTE — Telephone Encounter (Signed)
Pt was wanting to know if he will have to give blood during his next visit 6/13. He will be giving blood to his [facility] on 6/1 and 6/8 and didn't know if we would be able to see those results through [facility] Everywhere, pt specifically asked to speak to clinical staff, thanks!

## 2017-03-01 NOTE — Telephone Encounter (Signed)
Yes, we can see at [facility]

## 2017-03-01 NOTE — Telephone Encounter (Signed)
I contacted the patient and advised of message via voicemail. 

## 2017-03-01 NOTE — Telephone Encounter (Signed)
[facility] called to inquire about appointment for the patient. I advised her that the patient is scheduled for 03-20-17. She also advised that patient may call for refills on medication due to a change in medications lately. No further action necessary at this point.

## 2017-03-06 DIAGNOSIS — E1065 Type 1 diabetes mellitus with hyperglycemia: Secondary | ICD-10-CM | POA: Diagnosis not present

## 2017-03-06 DIAGNOSIS — R739 Hyperglycemia, unspecified: Secondary | ICD-10-CM | POA: Diagnosis not present

## 2017-03-06 DIAGNOSIS — Z436 Encounter for attention to other artificial openings of urinary tract: Secondary | ICD-10-CM | POA: Diagnosis not present

## 2017-03-06 DIAGNOSIS — N134 Hydroureter: Secondary | ICD-10-CM | POA: Diagnosis not present

## 2017-03-06 DIAGNOSIS — E039 Hypothyroidism, unspecified: Secondary | ICD-10-CM | POA: Diagnosis present

## 2017-03-06 DIAGNOSIS — R944 Abnormal results of kidney function studies: Secondary | ICD-10-CM | POA: Diagnosis not present

## 2017-03-06 DIAGNOSIS — T8619 Other complication of kidney transplant: Secondary | ICD-10-CM | POA: Diagnosis not present

## 2017-03-06 DIAGNOSIS — G4733 Obstructive sleep apnea (adult) (pediatric): Secondary | ICD-10-CM | POA: Diagnosis present

## 2017-03-06 DIAGNOSIS — Z4822 Encounter for aftercare following kidney transplant: Secondary | ICD-10-CM | POA: Diagnosis not present

## 2017-03-06 DIAGNOSIS — R600 Localized edema: Secondary | ICD-10-CM | POA: Diagnosis not present

## 2017-03-06 DIAGNOSIS — Z79899 Other long term (current) drug therapy: Secondary | ICD-10-CM | POA: Diagnosis not present

## 2017-03-06 DIAGNOSIS — N5089 Other specified disorders of the male genital organs: Secondary | ICD-10-CM | POA: Diagnosis present

## 2017-03-06 DIAGNOSIS — T380X5A Adverse effect of glucocorticoids and synthetic analogues, initial encounter: Secondary | ICD-10-CM | POA: Diagnosis not present

## 2017-03-06 DIAGNOSIS — N1330 Unspecified hydronephrosis: Secondary | ICD-10-CM | POA: Diagnosis present

## 2017-03-06 DIAGNOSIS — Z905 Acquired absence of kidney: Secondary | ICD-10-CM | POA: Diagnosis not present

## 2017-03-06 DIAGNOSIS — R7989 Other specified abnormal findings of blood chemistry: Secondary | ICD-10-CM | POA: Diagnosis not present

## 2017-03-06 DIAGNOSIS — Z7982 Long term (current) use of aspirin: Secondary | ICD-10-CM | POA: Diagnosis not present

## 2017-03-06 DIAGNOSIS — E875 Hyperkalemia: Secondary | ICD-10-CM | POA: Diagnosis present

## 2017-03-06 DIAGNOSIS — N179 Acute kidney failure, unspecified: Secondary | ICD-10-CM | POA: Diagnosis present

## 2017-03-06 DIAGNOSIS — I10 Essential (primary) hypertension: Secondary | ICD-10-CM | POA: Diagnosis not present

## 2017-03-06 DIAGNOSIS — Z940 Kidney transplant status: Secondary | ICD-10-CM | POA: Diagnosis not present

## 2017-03-06 DIAGNOSIS — R748 Abnormal levels of other serum enzymes: Secondary | ICD-10-CM | POA: Diagnosis not present

## 2017-03-06 DIAGNOSIS — E785 Hyperlipidemia, unspecified: Secondary | ICD-10-CM | POA: Diagnosis present

## 2017-03-06 DIAGNOSIS — T8611 Kidney transplant rejection: Secondary | ICD-10-CM | POA: Diagnosis present

## 2017-03-06 DIAGNOSIS — E118 Type 2 diabetes mellitus with unspecified complications: Secondary | ICD-10-CM | POA: Diagnosis not present

## 2017-03-06 DIAGNOSIS — N401 Enlarged prostate with lower urinary tract symptoms: Secondary | ICD-10-CM | POA: Diagnosis not present

## 2017-03-06 DIAGNOSIS — Z8639 Personal history of other endocrine, nutritional and metabolic disease: Secondary | ICD-10-CM | POA: Diagnosis not present

## 2017-03-06 DIAGNOSIS — E8770 Fluid overload, unspecified: Secondary | ICD-10-CM | POA: Diagnosis not present

## 2017-03-06 DIAGNOSIS — N138 Other obstructive and reflux uropathy: Secondary | ICD-10-CM | POA: Diagnosis not present

## 2017-03-06 DIAGNOSIS — Z87891 Personal history of nicotine dependence: Secondary | ICD-10-CM | POA: Diagnosis not present

## 2017-03-06 DIAGNOSIS — F419 Anxiety disorder, unspecified: Secondary | ICD-10-CM | POA: Diagnosis not present

## 2017-03-06 DIAGNOSIS — E10649 Type 1 diabetes mellitus with hypoglycemia without coma: Secondary | ICD-10-CM | POA: Diagnosis present

## 2017-03-06 DIAGNOSIS — D899 Disorder involving the immune mechanism, unspecified: Secondary | ICD-10-CM | POA: Diagnosis not present

## 2017-03-08 DIAGNOSIS — Z4822 Encounter for aftercare following kidney transplant: Secondary | ICD-10-CM | POA: Diagnosis not present

## 2017-03-08 NOTE — Addendum Note (Signed)
Addendum  created 03/08/17 1134 by Rica Koyanagi, MD   Sign clinical note

## 2017-03-14 DIAGNOSIS — N5089 Other specified disorders of the male genital organs: Secondary | ICD-10-CM

## 2017-03-14 HISTORY — DX: Other specified disorders of the male genital organs: N50.89

## 2017-03-20 ENCOUNTER — Ambulatory Visit: Payer: BLUE CROSS/BLUE SHIELD | Admitting: Endocrinology

## 2017-03-21 DIAGNOSIS — I10 Essential (primary) hypertension: Secondary | ICD-10-CM | POA: Diagnosis not present

## 2017-03-21 DIAGNOSIS — Z298 Encounter for other specified prophylactic measures: Secondary | ICD-10-CM | POA: Diagnosis not present

## 2017-03-21 DIAGNOSIS — Z5181 Encounter for therapeutic drug level monitoring: Secondary | ICD-10-CM | POA: Diagnosis not present

## 2017-03-21 DIAGNOSIS — Z792 Long term (current) use of antibiotics: Secondary | ICD-10-CM | POA: Diagnosis not present

## 2017-03-21 DIAGNOSIS — Z008 Encounter for other general examination: Secondary | ICD-10-CM | POA: Diagnosis not present

## 2017-03-21 DIAGNOSIS — E118 Type 2 diabetes mellitus with unspecified complications: Secondary | ICD-10-CM | POA: Diagnosis not present

## 2017-03-21 DIAGNOSIS — N5089 Other specified disorders of the male genital organs: Secondary | ICD-10-CM | POA: Diagnosis not present

## 2017-03-21 DIAGNOSIS — Z940 Kidney transplant status: Secondary | ICD-10-CM | POA: Diagnosis not present

## 2017-03-21 DIAGNOSIS — D899 Disorder involving the immune mechanism, unspecified: Secondary | ICD-10-CM | POA: Diagnosis not present

## 2017-03-21 DIAGNOSIS — Z8639 Personal history of other endocrine, nutritional and metabolic disease: Secondary | ICD-10-CM | POA: Diagnosis not present

## 2017-03-26 ENCOUNTER — Ambulatory Visit: Payer: Medicare Other | Admitting: Podiatry

## 2017-03-26 ENCOUNTER — Ambulatory Visit: Payer: Medicare Other | Admitting: Orthotics

## 2017-03-28 DIAGNOSIS — Z940 Kidney transplant status: Secondary | ICD-10-CM | POA: Diagnosis not present

## 2017-03-28 DIAGNOSIS — D631 Anemia in chronic kidney disease: Secondary | ICD-10-CM | POA: Diagnosis not present

## 2017-03-28 DIAGNOSIS — E1129 Type 2 diabetes mellitus with other diabetic kidney complication: Secondary | ICD-10-CM | POA: Diagnosis not present

## 2017-03-28 DIAGNOSIS — Z6837 Body mass index (BMI) 37.0-37.9, adult: Secondary | ICD-10-CM | POA: Diagnosis not present

## 2017-03-29 DIAGNOSIS — N442 Benign cyst of testis: Secondary | ICD-10-CM | POA: Diagnosis not present

## 2017-03-29 DIAGNOSIS — Z794 Long term (current) use of insulin: Secondary | ICD-10-CM | POA: Diagnosis not present

## 2017-03-29 DIAGNOSIS — Z5181 Encounter for therapeutic drug level monitoring: Secondary | ICD-10-CM | POA: Diagnosis not present

## 2017-03-29 DIAGNOSIS — N433 Hydrocele, unspecified: Secondary | ICD-10-CM | POA: Diagnosis not present

## 2017-03-29 DIAGNOSIS — Z940 Kidney transplant status: Secondary | ICD-10-CM | POA: Diagnosis not present

## 2017-03-29 DIAGNOSIS — Z298 Encounter for other specified prophylactic measures: Secondary | ICD-10-CM | POA: Diagnosis not present

## 2017-03-29 DIAGNOSIS — Z87891 Personal history of nicotine dependence: Secondary | ICD-10-CM | POA: Diagnosis not present

## 2017-03-29 DIAGNOSIS — N5082 Scrotal pain: Secondary | ICD-10-CM | POA: Diagnosis not present

## 2017-03-29 DIAGNOSIS — E119 Type 2 diabetes mellitus without complications: Secondary | ICD-10-CM | POA: Diagnosis not present

## 2017-04-04 ENCOUNTER — Telehealth: Payer: Self-pay | Admitting: Cardiovascular Disease

## 2017-04-04 NOTE — Telephone Encounter (Signed)
Spoke to patient. He notes Mariann Laster has faxed twice, to [facility], changes for Bipap settings from 17pp to 13pp. He followed up and [facility] claims they haven't received fax.  Needs pressure changes - willing to go to [facility] to get this done but knows they will need orders sent over.  Informed him i would follow up on this.  I spoke to [place] at [facility] who informed me of best fax # to send order changes: [phone]  Need orders sent there w follow up call to patient.

## 2017-04-04 NOTE — Telephone Encounter (Signed)
°  New Prob   Pt is requesting to speak to a nurse regarding a fax that was sent over for review. Please call.

## 2017-04-05 DIAGNOSIS — F54 Psychological and behavioral factors associated with disorders or diseases classified elsewhere: Secondary | ICD-10-CM | POA: Diagnosis not present

## 2017-04-05 DIAGNOSIS — Z940 Kidney transplant status: Secondary | ICD-10-CM | POA: Diagnosis not present

## 2017-04-05 DIAGNOSIS — G4733 Obstructive sleep apnea (adult) (pediatric): Secondary | ICD-10-CM | POA: Diagnosis not present

## 2017-04-05 DIAGNOSIS — I10 Essential (primary) hypertension: Secondary | ICD-10-CM | POA: Diagnosis not present

## 2017-04-05 DIAGNOSIS — Z794 Long term (current) use of insulin: Secondary | ICD-10-CM | POA: Diagnosis not present

## 2017-04-05 DIAGNOSIS — Z792 Long term (current) use of antibiotics: Secondary | ICD-10-CM | POA: Diagnosis not present

## 2017-04-05 DIAGNOSIS — E119 Type 2 diabetes mellitus without complications: Secondary | ICD-10-CM | POA: Diagnosis not present

## 2017-04-05 DIAGNOSIS — D899 Disorder involving the immune mechanism, unspecified: Secondary | ICD-10-CM | POA: Diagnosis not present

## 2017-04-05 DIAGNOSIS — N433 Hydrocele, unspecified: Secondary | ICD-10-CM | POA: Diagnosis not present

## 2017-04-07 DIAGNOSIS — N433 Hydrocele, unspecified: Secondary | ICD-10-CM | POA: Insufficient documentation

## 2017-04-07 HISTORY — DX: Hydrocele, unspecified: N43.3

## 2017-04-11 ENCOUNTER — Encounter: Payer: Self-pay | Admitting: Cardiovascular Disease

## 2017-04-17 ENCOUNTER — Other Ambulatory Visit (INDEPENDENT_AMBULATORY_CARE_PROVIDER_SITE_OTHER): Payer: Medicare Other

## 2017-04-17 DIAGNOSIS — Z794 Long term (current) use of insulin: Secondary | ICD-10-CM | POA: Diagnosis not present

## 2017-04-17 DIAGNOSIS — E1165 Type 2 diabetes mellitus with hyperglycemia: Secondary | ICD-10-CM

## 2017-04-17 LAB — GLUCOSE, RANDOM: Glucose, Bld: 233 mg/dL — ABNORMAL HIGH (ref 70–99)

## 2017-04-18 LAB — FRUCTOSAMINE: Fructosamine: 250 umol/L (ref 0–285)

## 2017-04-19 ENCOUNTER — Telehealth: Payer: Self-pay | Admitting: *Deleted

## 2017-04-19 NOTE — Telephone Encounter (Signed)
Faxed BIPAP pressure changes to [facility].

## 2017-04-23 ENCOUNTER — Other Ambulatory Visit: Payer: Self-pay

## 2017-04-23 ENCOUNTER — Ambulatory Visit (INDEPENDENT_AMBULATORY_CARE_PROVIDER_SITE_OTHER): Payer: Medicare Other | Admitting: Endocrinology

## 2017-04-23 ENCOUNTER — Encounter: Payer: Self-pay | Admitting: Endocrinology

## 2017-04-23 VITALS — BP 136/80 | HR 98 | Ht 72.0 in | Wt 271.2 lb

## 2017-04-23 DIAGNOSIS — E782 Mixed hyperlipidemia: Secondary | ICD-10-CM

## 2017-04-23 DIAGNOSIS — Z794 Long term (current) use of insulin: Secondary | ICD-10-CM | POA: Diagnosis not present

## 2017-04-23 DIAGNOSIS — E063 Autoimmune thyroiditis: Secondary | ICD-10-CM

## 2017-04-23 DIAGNOSIS — E1165 Type 2 diabetes mellitus with hyperglycemia: Secondary | ICD-10-CM | POA: Diagnosis not present

## 2017-04-23 MED ORDER — GLUCOSE BLOOD VI STRP: ORAL_STRIP | 1 refills | Status: DC

## 2017-04-23 MED ORDER — INSULIN ASPART 100 UNIT/ML FLEXPEN: PEN_INJECTOR | SUBCUTANEOUS | 1 refills | Status: DC

## 2017-04-23 MED ORDER — ROSUVASTATIN CALCIUM 10 MG PO TABS: 10.0000 mg | ORAL_TABLET | Freq: Every day | ORAL | 1 refills | Status: DC

## 2017-04-23 MED ORDER — LEVOTHYROXINE SODIUM 100 MCG PO TABS: 100.0000 ug | ORAL_TABLET | Freq: Every day | ORAL | 1 refills | Status: DC

## 2017-04-23 MED ORDER — INSULIN SYRINGE-NEEDLE U-100 31G X 5/16" 0.3 ML MISC
1 refills | Status: DC
Start: 2017-04-23 — End: 2021-10-18

## 2017-04-23 MED ORDER — ONETOUCH DELICA LANCETS FINE MISC: 1 refills | Status: DC

## 2017-04-23 MED ORDER — INSULIN PEN NEEDLE 31G X 5 MM MISC: 1 refills | Status: DC

## 2017-04-23 NOTE — Progress Notes (Signed)
Patient ID: Robert [name], male   DOB: [birth year], 58 y.o.   MRN: [id number]   Reason for Appointment: Type II Diabetes follow-up   History of Present Illness   Diagnosis date:  2012  Previous history: His blood sugar has been difficult to control since onset. Initially was taking oral hypoglycemic drugs like glyburide but could not take metformin because of renal dysfunction. Since Victoza did not help his control he was started on Lantus and then U 500 insulin also His A1c has previously ranged from 7.8-9.8, the lowest level in 03/2013 He was on Victoza but because of his markedly decreased appetite and weight loss this was stopped in 9/15  Recent history:   Insulin regimen: Tresiba 10 units In a.m. Novolin R before meals, 4-6-4 units with correction doses over 200  His A1c is usually lower than expected for his blood sugars and was last 5.8 His fructosamine now is relatively good at 250  Current blood sugar patterns, management and problems identified:  This is his first visit after his renal transplant  Apparently he was on prednisone and the doses have been tapered and he has taken only 5 mg since last Sunday  Although previously was taking U-500 insulin and generally significant amounts of about 3 units per 15 g of carbohydrate he is now taking only the U-100 Regular Insulin from [facility] and and minimal doses  This is despite his taking in at least 60 g of carbohydrate with his main meal  Also his TRESIBA has been reduced by almost 1/3  However he appears to have lost over 20 pounds  Taking [name] in the morning  He is checking his blood sugars somewhat erratically at different times including during the night  Has no consistent blood sugar pattern except overall trending to have progressively high readings between noon and 6 PM and overall lower readings overnight  Has more variability of his blood sugars overnight and fasting  Proper timing of medications  in relation to meals: Yes.          Monitors blood glucose:  3-5 times a day .    Glucometer: One Probation officer.          Blood Glucose readings by download  Mean values apply above for all meters except median for One Touch  PRE-MEAL Fasting Lunch Dinner Overnight  Overall  Glucose range: 68-287    60-256    Mean/median: 120  151   128  155+/-65    POST-MEAL PC Breakfast PC Lunch PC Dinner  Glucose range:   74-3 01   Mean/median:  190  160     Meals: 2-3 meals per day. 8 am and Noon and Dinner 6-7 PM .        Physical activity: exercise: not walking recently because of groin pain       Weight control:  Wt Readings from Last 3 Encounters:  04/23/17 271 lb 3.2 oz (123 kg)  02/13/17 292 lb 9 oz (132.7 kg)  02/13/17 293 lb (409.8 kg)        Complications:   nephropathy, neuropathy   Diabetes labs:   Lab Results  Component Value Date   HGBA1C 5.8 11/13/2016   HGBA1C 5.9 06/14/2016   HGBA1C 5.7 12/15/2015   Lab Results  Component Value Date   MICROALBUR 224.5 Repeated and verified X2. (H) 01/04/2014   LDLCALC 68 11/13/2016   CREATININE 8.36 (H) 11/13/2016    Lab Results  Component Value Date  FRUCTOSAMINE 250 04/17/2017   FRUCTOSAMINE 317 (H) 08/20/2016   FRUCTOSAMINE 305 (H) 06/14/2016     Lab on 04/17/2017  Component Date Value Ref Range Status  . Fructosamine 04/17/2017 250  0 - 285 umol/L Final   Comment: Published reference interval for apparently healthy subjects between age 28 and 74 is 60 - 285 umol/L and in a poorly controlled diabetic population is 228 - 563 umol/L with a mean of 396 umol/L.   Marland Kitchen Glucose, Bld 04/17/2017 233* 70 - 99 mg/dL Final    OTHER problems: See review of systems    Allergies as of 04/23/2017   No Known Allergies     Medication List       Accurate as of 04/23/17  8:56 PM. Always use your most recent med list.          amLODipine 5 MG tablet Commonly known as:  NORVASC Take 5 mg by mouth 2 (two) times daily.     aspirin EC 81 MG tablet Take 81 mg by mouth every morning.   DULoxetine 30 MG capsule Commonly known as:  CYMBALTA Take 30 mg by mouth daily.   fluocinonide cream 0.05 % Commonly known as:  LIDEX Apply 1 application topically daily as needed (Skin inflammation (hand, thigh, back)).   gabapentin 100 MG capsule Commonly known as:  NEURONTIN Take 200 mg by mouth every morning.   glucose blood test strip Commonly known as:  ONETOUCH VERIO Use to check blood sugars up to 6 times daily   insulin aspart 100 UNIT/ML FlexPen Commonly known as:  NOVOLOG FLEXPEN 10 units before each meal   Insulin Pen Needle 31G X 5 MM Misc Commonly known as:  B-D UF III MINI PEN NEEDLES USE 4 PER DAY TO INJECT INSULIN.   Insulin Syringe-Needle U-100 31G X 5/16" 0.3 ML Misc Commonly known as:  BD INSULIN SYRINGE ULTRAFINE Use to inject insulin 2-3 times per day   levothyroxine 100 MCG tablet Commonly known as:  SYNTHROID, LEVOTHROID Take 1 tablet (100 mcg total) by mouth daily.   lidocaine-prilocaine cream Commonly known as:  EMLA APPLY SMALL AMOUNT TO DIALYSIS SITE 30 -60 MINUTES PRIOR TO DIALYSIS 3X A WEEK   metoprolol tartrate 25 MG tablet Commonly known as:  LOPRESSOR Take 1 tablet (25 mg total) by mouth 2 (two) times daily. Do not take on dialysis days until after dialysis   mycophenolate 250 MG capsule Commonly known as:  CELLCEPT Take 250 mg by mouth 4 (four) times daily.   NOVOLIN R 100 units/mL injection Generic drug:  insulin regular Inject into the skin 3 (three) times daily before meals. 4 units before breakfast, 6 units before lunch, 4 units before dinner   ONETOUCH DELICA LANCETS FINE Misc Use to check blood sugars 4-6 times daily   polyethylene glycol packet Commonly known as:  MIRALAX / GLYCOLAX Take 17 g by mouth daily.   predniSONE 5 MG tablet Commonly known as:  DELTASONE Take 5 mg by mouth daily with breakfast.   ranitidine 150 MG tablet Commonly known as:   ZANTAC Take 150 mg by mouth daily.   rosuvastatin 10 MG tablet Commonly known as:  CRESTOR Take 1 tablet (10 mg total) by mouth daily.   senna-docusate 8.6-50 MG tablet Commonly known as:  Senokot-S Take 1 tablet by mouth as needed for mild constipation.   sulfamethoxazole-trimethoprim 800-160 MG tablet Commonly known as:  BACTRIM DS,SEPTRA DS Take 1 tablet by mouth daily.   tacrolimus 5 MG capsule  Commonly known as:  PROGRAF Take 5 mg by mouth 2 (two) times daily.   tamsulosin 0.4 MG Caps capsule Commonly known as:  FLOMAX Take 0.4 mg by mouth 2 (two) times daily.   traMADol 50 MG tablet Commonly known as:  ULTRAM Take 1 tablet (50 mg total) by mouth every 6 (six) hours as needed.   traZODone 50 MG tablet Commonly known as:  DESYREL Take 50-100 mg by mouth at bedtime as needed for sleep.   TRESIBA FLEXTOUCH 100 UNIT/ML Sopn FlexTouch Pen Generic drug:  insulin degludec INJECT 30 UNITS INTO THE SKIN EVERY MORNING   valGANciclovir 450 MG tablet Commonly known as:  VALCYTE Take 450 mg by mouth daily.   zolpidem 10 MG tablet Commonly known as:  AMBIEN Take 10-20 mg by mouth at bedtime as needed for sleep.       Allergies: No Known Allergies  Past Medical History:  Diagnosis Date  . Anemia    low hgb at present  . Arthritis    FEET  . Blood transfusion without reported diagnosis 1974   kidney removed - L  . Cancer ([place])    skin ca right shoulder, plastic dsyplasia, pre-Ca polpys removed on Colonoscopy- 07/2014  . Colon polyps 07/23/2014   Tubular adenomas x 5  . Constipation   . Diabetes mellitus without complication (HCC)    Type 2  . Difficult intubation    unsure of actual problem but it was during the January 28, 2015 procedure.  Marland Kitchen Dysrhythmia   . Eczema   . ESRD (end stage renal disease) ([place])    hEMO mwf  . Headache(784.0)    slight dull h/a due kidney failure  . Hyperlipidemia   . Hypertension    SVT h/o , followed by Dr. Claiborne Billings  .  Hypothyroidism   . Macular degeneration   . Neuropathy    right hand and both feet  . PAT (paroxysmal atrial tachycardia) ([place])   . PSVT (paroxysmal supraventricular tachycardia) ([place])   . SBO (small bowel obstruction) ([place])   . Shortness of breath    with exertion  . Sleep apnea 06/18/11   split-night sleep study- [facility]., BiPAP- 13-16  . Thyroid disease   . Umbilical hernia    will repair with thi surgery    Past Surgical History:  Procedure Laterality Date  . AV FISTULA PLACEMENT Right 02/25/2015   Procedure: RIGHT ARTERIOVENOUS (AV) FISTULA CREATION;  Surgeon: Serafina Mitchell, MD;  Location: [place];  Service: Vascular;  Laterality: Right;  . AV FISTULA PLACEMENT Right 09/15/2015   Procedure: ARTERIOVENOUS (AV) FISTULA CREATION- RIGHT ARM;  Surgeon: Serafina Mitchell, MD;  Location: [place];  Service: Vascular;  Laterality: Right;  . CAPD INSERTION N/A 05/18/2014   Procedure: LAPAROSCOPIC INSERTION CONTINUOUS AMBULATORY PERITONEAL DIALYSIS  (CAPD) CATHETER;  Surgeon: Ralene Ok, MD;  Location: [place];  Service: General;  Laterality: N/A;  . COLONOSCOPY    . COLONOSCOPY WITH PROPOFOL N/A 07/12/2016   Procedure: COLONOSCOPY WITH PROPOFOL;  Surgeon: [name], MD;  Location: [facility];  Service: Endoscopy;  Laterality: N/A;  . declotting of fistula  08/2015  . ESOPHAGOGASTRODUODENOSCOPY (EGD) WITH PROPOFOL N/A 07/12/2016   Procedure: ESOPHAGOGASTRODUODENOSCOPY (EGD) WITH PROPOFOL;  Surgeon: [name], MD;  [facility]: [facility];  Service: Endoscopy;  Laterality: N/A;  . FISTULA SUPERFICIALIZATION Right 02/07/2016   Procedure: RIIGHT UPPER ARM FISTULA SUPERFICIALIZATION;  Surgeon: Elam Dutch, MD;  Location: [place];  Service:  Vascular;  Laterality: Right;  . IJ catheter insertion    . INSERTION OF DIALYSIS CATHETER Right 02/25/2015   Procedure: INSERTION OF RIGHT INTERNAL JUGULAR DIALYSIS CATHETER;  Surgeon: Serafina Mitchell, MD;  Location: [place];   Service: Vascular;  Laterality: Right;  . LAPAROSCOPIC REPOSITIONING CAPD CATHETER N/A 06/16/2014   Procedure: LAPAROSCOPIC REPOSITIONING CAPD CATHETER;  Surgeon: Ralene Ok, MD;  Location: [place];  Service: General;  Laterality: N/A;  . LAPAROSCOPY N/A 05/04/2015   Procedure: LAPAROSCOPY DIAGNOSTIC LYSIS OF ADHESIONS;  Surgeon: Coralie Keens, MD;  Location: [place];  Service: General;  Laterality: N/A;  . MINOR REMOVAL OF PERITONEAL DIALYSIS CATHETER N/A 04/28/2015   Procedure:  REMOVAL OF PERITONEAL DIALYSIS CATHETER;  Surgeon: Coralie Keens, MD;  Location: [place];  Service: General;  Laterality: N/A;  . NEPHRECTOMY Left 1974  . RENAL BIOPSY Right 2012  . REVISON OF ARTERIOVENOUS FISTULA Right 05/26/2015   Procedure: SUPERFICIALIZATION OF ARTERIOVENOUS FISTULA WITH SIDE BRANCH LIGATIONS;  Surgeon: Serafina Mitchell, MD;  Location: [place];  Service: Vascular;  Laterality: Right;  . SKIN CANCER EXCISION     right shoulder  . SVT ABLATION N/A 11/23/2016   Procedure: SVT Ablation;  Surgeon: Will Meredith Leeds, MD;  Location: [facility];  Service: Cardiovascular;  Laterality: N/A;  . UMBILICAL HERNIA REPAIR N/A 05/18/2014   Procedure: HERNIA REPAIR UMBILICAL ADULT;  Surgeon: Ralene Ok, MD;  Location: [place];  Service: General;  Laterality: N/A;  . UMBILICAL HERNIA REPAIR N/A 04/28/2015   Procedure: UMBILICAL HERNIA REPAIR WITH MESH;  Surgeon: Coralie Keens, MD;  [facility];  Service: General;  Laterality: N/A;    Family History  Problem Relation Age of Onset  . Colon polyps Father   . Colon cancer Neg Hx   . Stomach cancer Neg Hx     Social History:  reports that he quit smoking about 4 years ago. His smoking use included Cigars. He has never used smokeless tobacco. He reports that he drinks alcohol. He reports that he does not use drugs.  Review of Systems:  Hypertension:    His blood pressure has been managed by his nephrologist   BP Readings from Last 3  Encounters:  04/23/17 136/80  02/13/17 (!) 152/106  02/13/17 (!) 148/87     Lipids:  His LDL is below 100 with 10 mg Crestor HDL is persistently low Needs follow-up labs Has no evidence of vascular disease    Lab Results  Component Value Date   CHOL 130 11/13/2016   CHOL 147 06/14/2016   CHOL 161 03/15/2016   Lab Results  Component Value Date   HDL 41.40 11/13/2016   HDL 36.70 (L) 06/14/2016   HDL 37.70 (L) 03/15/2016   Lab Results  Component Value Date   LDLCALC 68 11/13/2016   LDLCALC 87 06/14/2016   LDLCALC 96 03/15/2016   Lab Results  Component Value Date   TRIG 102.0 11/13/2016   TRIG 119.0 06/14/2016   TRIG 135.0 03/15/2016   Lab Results  Component Value Date   CHOLHDL 3 11/13/2016   CHOLHDL 4 06/14/2016   CHOLHDL 4 03/15/2016   No results found for: LDLDIRECT   HYPOTHYROIDISM: He has had long-standing hypothyroidism  Has been taking his 100 g levothyroxine before breakfast consistently He feels fairly good He had a TSH of 0.3 in May 2018 at [facility], the doses was not changed   Lab Results  Component Value Date   TSH 1.11 11/13/2016  HYPOGONADISM: 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 metabolic syndrome and had subjectively improved with supplementation using Fortesta  He has been off testosterone for Over a year and free testosterone was normal recently  Lab Results  Component Value Date   TESTOSTERONE 369 06/14/2016    Diabetic neuropathy: Has mild sensory loss on his last exam  RENAL dysfunction: His creatinine has now settled down to about 1.6 recently    Examination:   BP 136/80   Pulse 98   Ht 6' (1.829 m)   Wt 271 lb 3.2 oz (123 kg)   SpO2 97%   BMI 36.78 kg/m   Body mass index is 36.78 kg/m.    ASSESSMENT/ PLAN:    Diabetes type 2 with obesity:  See history of present illness for detailed discussion of his current management, problems identified and sugar patterns lately  His  treatment regimen has been completely change after his renal transplant and is needing much less insulin with his weight loss despite being treated with prednisone He is also not very active currently However his blood sugars as discussed above are quite erratic and tending to be higher postprandially between morning and evening With his tapering dose prednisone and variable carbohydrate intake he needs a more structured insulin regimen as discussed in detail today Currently on 5 mg prednisone this week  Recommendations: Since he is comfortable counting carbohydrates as before he will cover his meals with a 1:10 carbohydrate ratio For convenience and better postprandial control he will switch to NovoLog instead of regular insulin He will increase to [name] by at least 4 units He will adjust this based on fasting readings every 3-4 days, discussed fasting blood sugar targets Also discussed postprandial targets He will increase his NovoLog by a factor 1: 54 blood sugars over 100  Needs short-term follow-up  Hypothyroidism: Will need follow-up levels especially with his weight loss   Patient Instructions  Sugars over 150: add 1 unit per 50 mg glucose elevation  Tresiba 14 units daily and adjust based on am sugar weekly     Counseling time on subjects discussed in assessment and plan sections is over 50% of today's 25 minute visit    Gauge Winski 04/23/2017, 8:56 PM

## 2017-04-23 NOTE — Patient Instructions (Signed)
Sugars over 150: add 1 unit per 50 mg glucose elevation  Tresiba 14 units daily and adjust based on am sugar weekly

## 2017-04-25 ENCOUNTER — Telehealth: Payer: Self-pay | Admitting: Endocrinology

## 2017-04-25 ENCOUNTER — Ambulatory Visit: Payer: Medicare Other | Admitting: Podiatry

## 2017-04-25 ENCOUNTER — Ambulatory Visit: Payer: Medicare Other | Admitting: Orthotics

## 2017-04-25 NOTE — Telephone Encounter (Signed)
Patient called in reference to note that was sent to [facility] nurse. Patient said he wanted to disregard the message.

## 2017-04-25 NOTE — Telephone Encounter (Signed)
Routing to you °

## 2017-04-25 NOTE — Telephone Encounter (Signed)
Patient needs a Novolog Pen. He said something was called in Tuesday 0717 and it was incorrect.  Thank you,  -LL

## 2017-04-26 DIAGNOSIS — Z5181 Encounter for therapeutic drug level monitoring: Secondary | ICD-10-CM | POA: Diagnosis not present

## 2017-04-26 DIAGNOSIS — Z794 Long term (current) use of insulin: Secondary | ICD-10-CM | POA: Diagnosis not present

## 2017-04-26 DIAGNOSIS — N5082 Scrotal pain: Secondary | ICD-10-CM | POA: Diagnosis not present

## 2017-04-26 DIAGNOSIS — E1165 Type 2 diabetes mellitus with hyperglycemia: Secondary | ICD-10-CM | POA: Diagnosis not present

## 2017-04-26 DIAGNOSIS — Z940 Kidney transplant status: Secondary | ICD-10-CM | POA: Diagnosis not present

## 2017-04-26 NOTE — Telephone Encounter (Signed)
Called patient just to make sure everything was okay and he stated that it was.

## 2017-04-26 NOTE — Telephone Encounter (Signed)
Called patient and he stated that he accidentally made a mistake and that everything was okay.

## 2017-05-02 ENCOUNTER — Telehealth: Payer: Self-pay | Admitting: *Deleted

## 2017-05-02 NOTE — Telephone Encounter (Signed)
Received fax from [place] requesting last [facility] encounter note and Dr. Claiborne Billings approval and signature for new BiPAP machine.  Last OV with Dr. Claiborne Billings printed, will have Dr. Claiborne Billings review and sign when in office.

## 2017-05-10 ENCOUNTER — Telehealth: Payer: Self-pay | Admitting: Cardiovascular Disease

## 2017-05-10 NOTE — Telephone Encounter (Signed)
Pt wants to know if order for his BI PAC have been put in? He would like to know this today if possible please.

## 2017-05-10 NOTE — Telephone Encounter (Addendum)
Per previous message the order for his new bi-pap machine is here and waiting for dr Claiborne Billings to sign. Patient aware dr Claiborne Billings returns to the office Monday to sign.

## 2017-05-14 ENCOUNTER — Telehealth: Payer: Self-pay | Admitting: *Deleted

## 2017-05-14 NOTE — Telephone Encounter (Signed)
Faxed 02/14/27 office face to face note to [place] Patient.

## 2017-05-14 NOTE — Telephone Encounter (Signed)
Order signed.   FTF and signed order faxed to [phone].     Patient made aware.

## 2017-05-20 DIAGNOSIS — Z298 Encounter for other specified prophylactic measures: Secondary | ICD-10-CM | POA: Insufficient documentation

## 2017-05-22 ENCOUNTER — Encounter: Payer: Self-pay | Admitting: Podiatry

## 2017-05-22 ENCOUNTER — Ambulatory Visit (INDEPENDENT_AMBULATORY_CARE_PROVIDER_SITE_OTHER): Payer: Medicare Other | Admitting: Podiatry

## 2017-05-22 DIAGNOSIS — B351 Tinea unguium: Secondary | ICD-10-CM | POA: Diagnosis not present

## 2017-05-22 DIAGNOSIS — E1149 Type 2 diabetes mellitus with other diabetic neurological complication: Secondary | ICD-10-CM

## 2017-05-22 DIAGNOSIS — M79676 Pain in unspecified toe(s): Secondary | ICD-10-CM

## 2017-05-22 NOTE — Progress Notes (Signed)
Patient ID: Robert [name], male   DOB: 1961-05-21, 56 y.o.   MRN: [id number] Complaint:  Visit Type: Patient returns to my office for continued preventative foot care services. Complaint: Patient states" my nails have grown long and thick and become painful to walk and wear shoes" Patient has been diagnosed with DM and is taking dialysis and neuropathy.  . The patient presents for preventative foot care services. No changes to ROS  Podiatric Exam: Vascular: dorsalis pedis and posterior tibial pulses are palpable bilateral. Capillary return is immediate. Temperature gradient is WNL. Skin turgor WNL  Sensorium: Diminished  Semmes Weinstein monofilament test. Normal tactile sensation bilaterally. Nail Exam: Pt has thick disfigured discolored nails with subungual debris noted bilateral entire nail hallux through fifth toenails Ulcer Exam: There is no evidence of ulcer or pre-ulcerative changes or infection. Orthopedic Exam: Muscle tone and strength are WNL. No limitations in general ROM. No crepitus or effusions noted. Foot type and digits show no abnormalities. Bony prominences are unremarkable. HAV  B/L Skin: No Porokeratosis. No infection or ulcers  Diagnosis:  Onychomycosis, , Pain in right toe, pain in left toes, Diabetic neuropathy  Hallux limitus 1st MPJ  B/L  Treatment & Plan Procedures and Treatment: Consent by patient was obtained for treatment procedures. The patient understood the discussion of treatment and procedures well. All questions were answered thoroughly reviewed. Debridement of mycotic and hypertrophic toenails, 1 through 5 bilateral and clearing of subungual debris. No ulceration, no infection noted.  Initiate diabetic footwear for DPN and hallux limitus. Return Visit-Office Procedure: Patient instructed to return to the office for a follow up visit 3 months for continued evaluation and treatment.    Gardiner Barefoot DPM

## 2017-05-28 ENCOUNTER — Other Ambulatory Visit (INDEPENDENT_AMBULATORY_CARE_PROVIDER_SITE_OTHER): Payer: Medicare Other

## 2017-05-28 DIAGNOSIS — E063 Autoimmune thyroiditis: Secondary | ICD-10-CM | POA: Diagnosis not present

## 2017-05-28 DIAGNOSIS — E1165 Type 2 diabetes mellitus with hyperglycemia: Secondary | ICD-10-CM

## 2017-05-28 DIAGNOSIS — Z794 Long term (current) use of insulin: Secondary | ICD-10-CM

## 2017-05-28 DIAGNOSIS — E782 Mixed hyperlipidemia: Secondary | ICD-10-CM | POA: Diagnosis not present

## 2017-05-28 LAB — LIPID PANEL
CHOL/HDL RATIO: 3
Cholesterol: 137 mg/dL (ref 0–200)
HDL: 49.6 mg/dL (ref >39.00)
LDL Cholesterol: 73 mg/dL (ref 0–99)
NONHDL: 87.57
TRIGLYCERIDES: 71 mg/dL (ref 0.0–149.0)
VLDL: 14.2 mg/dL (ref 0.0–40.0)

## 2017-05-28 LAB — BASIC METABOLIC PANEL
BUN: 27 mg/dL — AB (ref 6–23)
CALCIUM: 10.1 mg/dL (ref 8.4–10.5)
CHLORIDE: 110 meq/L (ref 96–112)
CO2: 24 mEq/L (ref 19–32)
CREATININE: 1.8 mg/dL — AB (ref 0.40–1.50)
GFR: 41.39 mL/min — ABNORMAL LOW (ref >60.00)
Glucose, Bld: 159 mg/dL — ABNORMAL HIGH (ref 70–99)
Potassium: 4.8 mEq/L (ref 3.5–5.1)
Sodium: 138 mEq/L (ref 135–145)

## 2017-05-28 LAB — T4, FREE: Free T4: 0.87 ng/dL (ref 0.60–1.60)

## 2017-05-28 LAB — TSH: TSH: 1.3 u[IU]/mL (ref 0.35–4.50)

## 2017-05-28 LAB — HEMOGLOBIN A1C: HEMOGLOBIN A1C: 6.3 % (ref 4.6–6.5)

## 2017-05-29 LAB — FRUCTOSAMINE: Fructosamine: 277 umol/L (ref 0–285)

## 2017-05-30 ENCOUNTER — Ambulatory Visit (INDEPENDENT_AMBULATORY_CARE_PROVIDER_SITE_OTHER): Payer: Medicare Other | Admitting: Endocrinology

## 2017-05-30 ENCOUNTER — Encounter: Payer: Self-pay | Admitting: Endocrinology

## 2017-05-30 VITALS — BP 144/94 | HR 75 | Ht 72.0 in | Wt 270.4 lb

## 2017-05-30 DIAGNOSIS — E782 Mixed hyperlipidemia: Secondary | ICD-10-CM

## 2017-05-30 DIAGNOSIS — E1165 Type 2 diabetes mellitus with hyperglycemia: Secondary | ICD-10-CM

## 2017-05-30 DIAGNOSIS — E063 Autoimmune thyroiditis: Secondary | ICD-10-CM | POA: Diagnosis not present

## 2017-05-30 DIAGNOSIS — Z794 Long term (current) use of insulin: Secondary | ICD-10-CM

## 2017-05-30 MED ORDER — HUMULIN R U-500 (CONCENTRATED) 500 UNIT/ML ~~LOC~~ SOLN: SUBCUTANEOUS | 1 refills | Status: DC

## 2017-05-30 NOTE — Patient Instructions (Addendum)
Humulin R divide carbs by 5 and take 30  Min before meals  Add extra 1 unit per 30 points over 150

## 2017-05-30 NOTE — Progress Notes (Signed)
Patient ID: Robert [name], male   DOB: Feb 08, 1961, 56 y.o.   MRN: [id number]   Reason for Appointment: Type II Diabetes follow-up   History of Present Illness   Diagnosis date:  2012  Previous history: His blood sugar has been difficult to control since onset. Initially was taking oral hypoglycemic drugs like glyburide but could not take metformin because of renal dysfunction. Since Victoza did not help his control he was started on Lantus and then U 500 insulin also His A1c has previously ranged from 7.8-9.8, the lowest level in 03/2013 He was on Victoza but because of his markedly decreased appetite and weight loss this was stopped in 9/15  Recent history:   Insulin regimen: Tresiba 16 units In a.m., NovoLog mealtime coverage 1:5 carbohydrate ratio  His A1c is usually lower than expected for his blood sugars and is now 6.3, previously was 5.8 His fructosamine now is relatively higher at 277, previously was at 250  Current blood sugar patterns, management and problems identified:  Even though he was requiring small doses of insulin on his visit last month he appears to have progressively increasing blood sugars  He was given NovoLog to control postprandial hyperglycemia instead of regular insulin  However he is not quite clear on how to calculate his mealtime coverage based on carbohydrates and may be taking only 1:15 coverage  Today also at lunchtime he did not take any mealtime insulin since he had a low sugar yogurt  FASTING blood sugars averaging generally higher although somewhat better in the last few days  He has gone up 2 units on his [name], previously taking as much is 30 units  Over the course of the day his blood sugars appear to be progressively higher especially in the early afternoon and evening before supper  Blood sugars after evening meal are quite variable  Not clear why his sugars maybe sometimes higher late at night  Currently not exercising  but his weight is stable  His prednisone dose has not been increased from 5 mg  Proper timing of medications in relation to meals: Yes.          Monitors blood glucose:  3-5 times a day .    Glucometer: One [employer].          Blood Glucose readings by download  Mean values apply above for all meters except median for One Touch  PRE-MEAL Fasting Lunch Dinner Overnight  Overall  Glucose range:  1 17-235    96-296    Mean/median: 158  202  209  143  170+/-47    POST-MEAL PC Breakfast PC Lunch PC Dinner  Glucose range:     Mean/median:  169  160      Meals: 2-3 meals per day. 8 am and Noon and Dinner 6-7 PM .        Physical activity: exercise: not walking again because of groin pain       Weight control:  Wt Readings from Last 3 Encounters:  05/30/17 270 lb 6.4 oz (122.7 kg)  04/23/17 271 lb 3.2 oz (123 kg)  02/13/17 292 lb 9 oz (505.3 kg)        Complications:   nephropathy, neuropathy   Diabetes labs:   Lab Results  Component Value Date   HGBA1C 6.3 05/28/2017   HGBA1C 5.8 11/13/2016   HGBA1C 5.9 06/14/2016   Lab Results  Component Value Date   MICROALBUR 224.5 Repeated and verified X2. (H) 01/04/2014  LDLCALC 73 05/28/2017   CREATININE 1.80 (H) 05/28/2017    Lab Results  Component Value Date   FRUCTOSAMINE 277 05/28/2017   FRUCTOSAMINE 250 04/17/2017   FRUCTOSAMINE 317 (H) 08/20/2016     Lab on 05/28/2017  Component Date Value Ref Range Status  . Sodium 05/28/2017 138  135 - 145 mEq/L Final  . Potassium 05/28/2017 4.8  3.5 - 5.1 mEq/L Final  . Chloride 05/28/2017 110  96 - 112 mEq/L Final  . CO2 05/28/2017 24  19 - 32 mEq/L Final  . Glucose, Bld 05/28/2017 159* 70 - 99 mg/dL Final  . BUN 05/28/2017 27* 6 - 23 mg/dL Final  . Creatinine, Ser 05/28/2017 1.80* 0.40 - 1.50 mg/dL Final  . Calcium 05/28/2017 10.1  8.4 - 10.5 mg/dL Final  . GFR 05/28/2017 41.39* >60.00 mL/min Final  . Fructosamine 05/28/2017 277  0 - 285 umol/L Final   Comment:  Published reference interval for apparently healthy subjects between age 76 and 41 is 23 - 285 umol/L and in a poorly controlled diabetic population is 228 - 563 umol/L with a mean of 396 umol/L.   Marland Kitchen Hgb A1c MFr Bld 05/28/2017 6.3  4.6 - 6.5 % Final   Glycemic Control Guidelines for People with Diabetes:Non Diabetic:  <6%Goal of Therapy: <7%Additional Action Suggested:  >8%   . TSH 05/28/2017 1.30  0.35 - 4.50 uIU/mL Final  . Free T4 05/28/2017 0.87  0.60 - 1.60 ng/dL Final   Comment: Specimens from patients who are undergoing biotin therapy and /or ingesting biotin supplements may contain high levels of biotin.  The higher biotin concentration in these specimens interferes with this Free T4 assay.  Specimens that contain high levels  of biotin may cause false high results for this Free T4 assay.  Please interpret results in light of the total clinical presentation of the patient.    . Cholesterol 05/28/2017 137  0 - 200 mg/dL Final   ATP III Classification       Desirable:  < 200 mg/dL               Borderline High:  200 - 239 mg/dL          High:  > = 240 mg/dL  . Triglycerides 05/28/2017 71.0  0.0 - 149.0 mg/dL Final   Normal:  <150 mg/dLBorderline High:  150 - 199 mg/dL  . HDL 05/28/2017 49.60  >39.00 mg/dL Final  . VLDL 05/28/2017 14.2  0.0 - 40.0 mg/dL Final  . LDL Cholesterol 05/28/2017 73  0 - 99 mg/dL Final  . Total CHOL/HDL Ratio 05/28/2017 3   Final                  Men          Women1/2 Average Risk     3.4          3.3Average Risk          5.0          4.42X Average Risk          9.6          7.13X Average Risk          15.0          11.0                      . NonHDL 05/28/2017 87.57   Final   NOTE:  Non-HDL goal should be 30 mg/dL higher than  patient's LDL goal (i.e. LDL goal of < 70 mg/dL, would have non-HDL goal of < 100 mg/dL)    OTHER problems: See review of systems    Allergies as of 05/30/2017   No Known Allergies     Medication List       Accurate as of  05/30/17  9:09 PM. Always use your most recent med list.          amLODipine 5 MG tablet Commonly known as:  NORVASC Take 5 mg by mouth 2 (two) times daily.   aspirin EC 81 MG tablet Take 81 mg by mouth every morning.   DULoxetine 30 MG capsule Commonly known as:  CYMBALTA Take 30 mg by mouth daily.   fluocinonide cream 0.05 % Commonly known as:  LIDEX Apply 1 application topically daily as needed (Skin inflammation (hand, thigh, back)).   gabapentin 100 MG capsule Commonly known as:  NEURONTIN Take 200 mg by mouth 2 (two) times daily.   glucose blood test strip Commonly known as:  ONETOUCH VERIO Use to check blood sugars up to 6 times daily   HUMULIN R 500 UNIT/ML injection Generic drug:  insulin regular human CONCENTRATED Using a U-100 1cc/31G/5/16" INSULIN SYRINGE, draw up and inject 5-18 units into the skin three times a day 30 minutes prior to each meal   insulin aspart 100 UNIT/ML FlexPen Commonly known as:  NOVOLOG FLEXPEN 10 units before each meal   Insulin Pen Needle 31G X 5 MM Misc Commonly known as:  B-D UF III MINI PEN NEEDLES USE 4 PER DAY TO INJECT INSULIN.   Insulin Syringe-Needle U-100 31G X 5/16" 0.3 ML Misc Commonly known as:  BD INSULIN SYRINGE ULTRAFINE Use to inject insulin 2-3 times per day   levothyroxine 100 MCG tablet Commonly known as:  SYNTHROID, LEVOTHROID Take 1 tablet (100 mcg total) by mouth daily.   lidocaine-prilocaine cream Commonly known as:  EMLA APPLY SMALL AMOUNT TO DIALYSIS SITE 30 -60 MINUTES PRIOR TO DIALYSIS 3X A WEEK   metoprolol tartrate 25 MG tablet Commonly known as:  LOPRESSOR Take 1 tablet (25 mg total) by mouth 2 (two) times daily. Do not take on dialysis days until after dialysis   mycophenolate 250 MG capsule Commonly known as:  CELLCEPT Take 250 mg by mouth 4 (four) times daily.   ONETOUCH DELICA LANCETS FINE Misc Use to check blood sugars 4-6 times daily   polyethylene glycol packet Commonly known as:   MIRALAX / GLYCOLAX Take 17 g by mouth daily.   predniSONE 5 MG tablet Commonly known as:  DELTASONE Take 5 mg by mouth daily with breakfast.   ranitidine 150 MG tablet Commonly known as:  ZANTAC Take 150 mg by mouth daily.   rosuvastatin 10 MG tablet Commonly known as:  CRESTOR Take 1 tablet (10 mg total) by mouth daily.   senna-docusate 8.6-50 MG tablet Commonly known as:  Senokot-S Take 1 tablet by mouth as needed for mild constipation.   sulfamethoxazole-trimethoprim 800-160 MG tablet Commonly known as:  BACTRIM DS,SEPTRA DS Take 1 tablet by mouth daily.   tacrolimus 5 MG capsule Commonly known as:  PROGRAF Take 5 mg by mouth 2 (two) times daily.   tamsulosin 0.4 MG Caps capsule Commonly known as:  FLOMAX Take 0.4 mg by mouth 2 (two) times daily.   traMADol 50 MG tablet Commonly known as:  ULTRAM Take 1 tablet (50 mg total) by mouth every 6 (six) hours as needed.   traZODone 50 MG tablet  Commonly known as:  DESYREL Take 50-100 mg by mouth at bedtime as needed for sleep.   TRESIBA FLEXTOUCH 100 UNIT/ML Sopn FlexTouch Pen Generic drug:  insulin degludec INJECT 30 UNITS INTO THE SKIN EVERY MORNING   valGANciclovir 450 MG tablet Commonly known as:  VALCYTE Take 450 mg by mouth daily.   zolpidem 10 MG tablet Commonly known as:  AMBIEN Take 10-20 mg by mouth at bedtime as needed for sleep.            Discharge Care Instructions        Start     Ordered   05/30/17 0000  HUMULIN R 500 UNIT/ML injection     05/30/17 1656      Allergies: No Known Allergies  Past Medical History:  Diagnosis Date  . Anemia    low hgb at present  . Arthritis    FEET  . Blood transfusion without reported diagnosis 1974   kidney removed - L  . Cancer ([place])    skin ca right shoulder, plastic dsyplasia, pre-Ca polpys removed on Colonoscopy- 07/2014  . Colon polyps 07/23/2014   Tubular adenomas x 5  . Constipation   . Diabetes mellitus without complication (HCC)     Type 2  . Difficult intubation    unsure of actual problem but it was during the January 28, 2015 procedure.  Marland Kitchen Dysrhythmia   . Eczema   . ESRD (end stage renal disease) ([place])    hEMO mwf  . Headache(784.0)    slight dull h/a due kidney failure  . Hyperlipidemia   . Hypertension    SVT h/o , followed by Dr. Claiborne Billings  . Hypothyroidism   . Macular degeneration   . Neuropathy    right hand and both feet  . PAT (paroxysmal atrial tachycardia) ([place])   . PSVT (paroxysmal supraventricular tachycardia) ([place])   . SBO (small bowel obstruction) ([place])   . Shortness of breath    with exertion  . Sleep apnea 06/18/11   split-night sleep study- [facility]., BiPAP- 13-16  . Thyroid disease   . Umbilical hernia    will repair with thi surgery    Past Surgical History:  Procedure Laterality Date  . AV FISTULA PLACEMENT Right 02/25/2015   Procedure: RIGHT ARTERIOVENOUS (AV) FISTULA CREATION;  Surgeon: Serafina Mitchell, MD;  Location: [place];  Service: Vascular;  Laterality: Right;  . AV FISTULA PLACEMENT Right 09/15/2015   Procedure: ARTERIOVENOUS (AV) FISTULA CREATION- RIGHT ARM;  Surgeon: Serafina Mitchell, MD;  Location: [place];  Service: Vascular;  Laterality: Right;  . CAPD INSERTION N/A 05/18/2014   Procedure: LAPAROSCOPIC INSERTION CONTINUOUS AMBULATORY PERITONEAL DIALYSIS  (CAPD) CATHETER;  Surgeon: Ralene Ok, MD;  Location: [place];  Service: General;  Laterality: N/A;  . COLONOSCOPY    . COLONOSCOPY WITH PROPOFOL N/A 07/12/2016   Procedure: COLONOSCOPY WITH PROPOFOL;  Surgeon: [name], MD;  Location: [facility];  Service: Endoscopy;  Laterality: N/A;  . declotting of fistula  08/2015  . ESOPHAGOGASTRODUODENOSCOPY (EGD) WITH PROPOFOL N/A 07/12/2016   Procedure: ESOPHAGOGASTRODUODENOSCOPY (EGD) WITH PROPOFOL;  Surgeon: [name], MD;  Location: [facility];  Service: Endoscopy;  Laterality: N/A;  . FISTULA SUPERFICIALIZATION Right 02/07/2016   Procedure:  RIIGHT UPPER ARM FISTULA SUPERFICIALIZATION;  Surgeon: Elam Dutch, MD;  Location: [place];  Service: Vascular;  Laterality: Right;  . IJ catheter insertion    . INSERTION OF DIALYSIS CATHETER Right 02/25/2015   Procedure: INSERTION  OF RIGHT INTERNAL JUGULAR DIALYSIS CATHETER;  Surgeon: Serafina Mitchell, MD;  Location: [place];  Service: Vascular;  Laterality: Right;  . LAPAROSCOPIC REPOSITIONING CAPD CATHETER N/A 06/16/2014   Procedure: LAPAROSCOPIC REPOSITIONING CAPD CATHETER;  Surgeon: Ralene Ok, MD;  Location: [place];  Service: General;  Laterality: N/A;  . LAPAROSCOPY N/A 05/04/2015   Procedure: LAPAROSCOPY DIAGNOSTIC LYSIS OF ADHESIONS;  Surgeon: Coralie Keens, MD;  Location: [place];  Service: General;  Laterality: N/A;  . MINOR REMOVAL OF PERITONEAL DIALYSIS CATHETER N/A 04/28/2015   Procedure:  REMOVAL OF PERITONEAL DIALYSIS CATHETER;  Surgeon: Coralie Keens, MD;  Location: [place];  Service: General;  Laterality: N/A;  . NEPHRECTOMY Left 1974  . RENAL BIOPSY Right 2012  . REVISON OF ARTERIOVENOUS FISTULA Right 05/26/2015   Procedure: SUPERFICIALIZATION OF ARTERIOVENOUS FISTULA WITH SIDE BRANCH LIGATIONS;  Surgeon: Serafina Mitchell, MD;  Location: [place];  Service: Vascular;  Laterality: Right;  . SKIN CANCER EXCISION     right shoulder  . SVT ABLATION N/A 11/23/2016   Procedure: SVT Ablation;  Surgeon: Will Meredith Leeds, MD;  Location: [place] [facility];  Service: Cardiovascular;  Laterality: N/A;  . UMBILICAL HERNIA REPAIR N/A 05/18/2014   Procedure: HERNIA REPAIR UMBILICAL ADULT;  Surgeon: Ralene Ok, MD;  Location: [place];  Service: General;  Laterality: N/A;  . UMBILICAL HERNIA REPAIR N/A 04/28/2015   Procedure: UMBILICAL HERNIA REPAIR WITH MESH;  Surgeon: Coralie Keens, MD;  [facility];  Service: General;  Laterality: N/A;    Family History  Problem Relation Age of Onset  . Colon polyps Father   . Colon cancer Neg Hx   . Stomach cancer Neg Hx     Social  History:  reports that he quit smoking about 4 years ago. His smoking use included Cigars. He has never used smokeless tobacco. He reports that he drinks alcohol. He reports that he does not use drugs.  Review of Systems:  Hypertension:    His blood pressure has been managed by his nephrologist and transplant team, he thinks his blood pressure has been much higher recently   BP Readings from Last 3 Encounters:  05/30/17 (!) 144/94  04/23/17 136/80  02/13/17 (!) 152/106     Lipids:  His LDL isWell controlled with 10 mg Crestor Has no evidence of vascular disease    Lab Results  Component Value Date   CHOL 137 05/28/2017   CHOL 130 11/13/2016   CHOL 147 06/14/2016   Lab Results  Component Value Date   HDL 49.60 05/28/2017   HDL 41.40 11/13/2016   HDL 36.70 (L) 06/14/2016   Lab Results  Component Value Date   LDLCALC 73 05/28/2017   LDLCALC 68 11/13/2016   LDLCALC 87 06/14/2016   Lab Results  Component Value Date   TRIG 71.0 05/28/2017   TRIG 102.0 11/13/2016   TRIG 119.0 06/14/2016   Lab Results  Component Value Date   CHOLHDL 3 05/28/2017   CHOLHDL 3 11/13/2016   CHOLHDL 4 06/14/2016   No results found for: LDLDIRECT   HYPOTHYROIDISM: He has had long-standing hypothyroidism  Has been taking his 100 g levothyroxine before breakfast consistently Recent TSH normal  Lab Results  Component Value Date   TSH 1.30 05/28/2017       HYPOGONADISM: 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 metabolic syndrome and had subjectively improved with supplementation using Fortesta  He has been off testosterone Since early 2016   Lab Results  Component  Value Date   TESTOSTERONE 369 06/14/2016    Diabetic neuropathy: Has mild sensory loss on his last exam      Examination:   BP (!) 144/94   Pulse 75   Ht 6' (1.829 m)   Wt 270 lb 6.4 oz (122.7 kg)   SpO2 97%   BMI 36.67 kg/m   Body mass index is 36.67 kg/m.    ASSESSMENT/ PLAN:     Diabetes type 2 with obesity:  See history of present illness for detailed discussion of his current management, problems identified and sugar patterns lately  As above his insulin requirement is starting to go up again even though he was taking much lower doses of insulin after coming home from his surgery Although his fasting readings are slightly better now he has significantly higher readings midday and afternoon and some overnight also He did better previously with taking the U-500 insulin Currently is not accurately calculating his carbohydrates and is not using his smart phone to calculate and look up his carbohydrates as needed He may probably be getting inadequate insulin for his meals at times and this may also cause variability in his blood sugars Fasting readings are variable but somewhat better recently  Recommendations: Since he can look up his carbohydrate information now more recently he was use this to base his mealtime insulin He has some Humulin R U-500 insulin still at home and he can start using this again with his syringes He will take 1:5 carbohydrate coverage for this also Explained to him an example of using 3 units per slice of bread which has about 15 g of carbohydrate He will also try to take insulin 30 minutes before eating For now he can continue his [name] unchanged as fasting readings are not consistently high and will probably be better with starting the U-500 insulin Discussed actions of different types of insulins Hopefully can start exercise also some  Needs short-term follow-up  Hypothyroidism: Adequately controlled  LIPIDS: Excellent control currently on Crestor  Renal dysfunction: Still relatively stable with creatinine 1.8 after his transplant  Hypertension: He will follow-up tomorrow with his transplant team   Patient Instructions  Humulin R divide carbs by 5 and take 30  Min before meals  Add extra 1 unit per 30 points over 150          Counseling time on subjects discussed in assessment and plan sections is over 50% of today's 25 minute visit    Edan Juday 05/30/2017, 9:09 PM

## 2017-05-31 ENCOUNTER — Other Ambulatory Visit: Payer: Self-pay | Admitting: Nurse Practitioner

## 2017-05-31 DIAGNOSIS — R1909 Other intra-abdominal and pelvic swelling, mass and lump: Secondary | ICD-10-CM

## 2017-05-31 DIAGNOSIS — Z940 Kidney transplant status: Secondary | ICD-10-CM | POA: Diagnosis not present

## 2017-05-31 DIAGNOSIS — Z5181 Encounter for therapeutic drug level monitoring: Secondary | ICD-10-CM | POA: Diagnosis not present

## 2017-05-31 DIAGNOSIS — R1031 Right lower quadrant pain: Secondary | ICD-10-CM

## 2017-05-31 DIAGNOSIS — N433 Hydrocele, unspecified: Secondary | ICD-10-CM | POA: Diagnosis not present

## 2017-06-04 ENCOUNTER — Ambulatory Visit
Admission: RE | Admit: 2017-06-04 | Discharge: 2017-06-04 | Disposition: A | Discharge status: Home / Self Care | Payer: Medicare Other | Source: Ambulatory Visit | Attending: Nurse Practitioner | Admitting: Nurse Practitioner

## 2017-06-04 DIAGNOSIS — R1031 Right lower quadrant pain: Secondary | ICD-10-CM

## 2017-06-04 DIAGNOSIS — R1909 Other intra-abdominal and pelvic swelling, mass and lump: Secondary | ICD-10-CM

## 2017-06-04 DIAGNOSIS — Z940 Kidney transplant status: Secondary | ICD-10-CM | POA: Diagnosis not present

## 2017-06-04 DIAGNOSIS — R1030 Lower abdominal pain, unspecified: Secondary | ICD-10-CM | POA: Diagnosis not present

## 2017-06-12 ENCOUNTER — Other Ambulatory Visit: Payer: Medicare Other

## 2017-06-13 DIAGNOSIS — Z5181 Encounter for therapeutic drug level monitoring: Secondary | ICD-10-CM | POA: Diagnosis not present

## 2017-06-13 DIAGNOSIS — Z940 Kidney transplant status: Secondary | ICD-10-CM | POA: Diagnosis not present

## 2017-06-19 ENCOUNTER — Other Ambulatory Visit: Payer: Self-pay | Admitting: Endocrinology

## 2017-06-19 NOTE — Telephone Encounter (Signed)
Called patient and left a voice message to see if they needed a refill of the Novolog since I see in the last note they are using the Humulin R 500. Advised to please call us back if they do or do not need this prescription.

## 2017-06-20 ENCOUNTER — Telehealth: Payer: Self-pay | Admitting: Endocrinology

## 2017-06-20 NOTE — Telephone Encounter (Signed)
Patient called in reference to insulin aspart (NOVOLOG FLEXPEN) 100 UNIT/ML FlexPen. Patient stated to NOT refill this. Patient stated he will call office when out of HUMULIN R 500 UNIT/ML injection. Please call patient with any questions. OK to leave message.

## 2017-06-22 NOTE — Progress Notes (Signed)
Cardiology Office Note Date:  06/25/2017  Patient ID:  Robert, [name] July 06, 1961, MRN [id number] PCP:  Haywood Pao, MD  Cardiologist: Dr. Claiborne Billings  Electrophysiologist:  Dr. Curt Bears Transplant MD: Dr. Lissa Merlin ([place])    Chief Complaint: planned 6 mo visit  History of Present Illness: Robert [name] is a 56 y.o. male with history of ESRF underwent kidney transplant 02/25/17, maintained in immunospressive rx, HTN, DM, morbid obesity, OSA w/CPAP, HLD, hypothyroidism, and SVT that has been responsive to vagal maneuvers, underwent negative EP study in Feb 2018, maintained on BB.  He comes in today to be seen for Dr. Curt Bears.  Last seen by hinm in March, at that time doing well, discussed if recurrent SVT could offer repeat ablation without anesthesia and/or titration of meds.  He is doing well, finally since his transplant things are settling down, with some early rejection problems, though so far resolved.  His nephrologist is managing his BP.  He remarks that he is frustrated because at his office visits his BP is good, but gets high numbers at home.  He denies any kind of CP, no palpitations, no racing/tachycardias at all.  Occasionally when checking his BP he will note a skipped beat but not routinely.  No dizziness, near syncope or syncope.  Unfortunately not currently using his CPAP< thinks there is an issue with the pressure and the machine and is pending this to get straightened out.  Past Medical History:  Diagnosis Date  . Anemia    low hgb at present  . Arthritis    FEET  . Blood transfusion without reported diagnosis 1974   kidney removed - L  . Cancer ([place])    skin ca right shoulder, plastic dsyplasia, pre-Ca polpys removed on Colonoscopy- 07/2014  . Colon polyps 07/23/2014   Tubular adenomas x 5  . Constipation   . Diabetes mellitus without complication (HCC)    Type 2  . Difficult intubation    unsure of actual problem but it was during the January 28, 2015  procedure.  Marland Kitchen Dysrhythmia   . Eczema   . ESRD (end stage renal disease) ([place])    hEMO mwf  . Headache(784.0)    slight dull h/a due kidney failure  . Hyperlipidemia   . Hypertension    SVT h/o , followed by Dr. Claiborne Billings  . Hypothyroidism   . Macular degeneration   . Neuropathy    right hand and both feet  . PAT (paroxysmal atrial tachycardia) ([place])   . PSVT (paroxysmal supraventricular tachycardia) ([place])   . SBO (small bowel obstruction) ([place])   . Shortness of breath    with exertion  . Sleep apnea 06/18/11   split-night sleep study- [facility]., BiPAP- 13-16  . Thyroid disease   . Umbilical hernia    will repair with thi surgery    Past Surgical History:  Procedure Laterality Date  . AV FISTULA PLACEMENT Right 02/25/2015   Procedure: RIGHT ARTERIOVENOUS (AV) FISTULA CREATION;  Surgeon: Serafina Mitchell, MD;  Location: [facility];  Service: Vascular;  Laterality: Right;  . AV FISTULA PLACEMENT Right 09/15/2015   Procedure: ARTERIOVENOUS (AV) FISTULA CREATION- RIGHT ARM;  Surgeon: Serafina Mitchell, MD;  Location: [facility];  Service: Vascular;  Laterality: Right;  . CAPD INSERTION N/A 05/18/2014   Procedure: LAPAROSCOPIC INSERTION CONTINUOUS AMBULATORY PERITONEAL DIALYSIS  (CAPD) CATHETER;  Surgeon: Ralene Ok, MD;  Location: [facility];  Service: General;  Laterality: N/A;  . COLONOSCOPY    .  COLONOSCOPY WITH PROPOFOL N/A 07/12/2016   Procedure: COLONOSCOPY WITH PROPOFOL;  Surgeon: [name], MD;  Location: [facility];  Service: Endoscopy;  Laterality: N/A;  . declotting of fistula  08/2015  . ESOPHAGOGASTRODUODENOSCOPY (EGD) WITH PROPOFOL N/A 07/12/2016   Procedure: ESOPHAGOGASTRODUODENOSCOPY (EGD) WITH PROPOFOL;  Surgeon: [name], MD;  [facility];  Service: Endoscopy;  Laterality: N/A;  . FISTULA SUPERFICIALIZATION Right 02/07/2016   Procedure: RIIGHT UPPER ARM FISTULA SUPERFICIALIZATION;  Surgeon: Elam Dutch, MD;  Location: [facility];   Service: Vascular;  Laterality: Right;  . IJ catheter insertion    . INSERTION OF DIALYSIS CATHETER Right 02/25/2015   Procedure: INSERTION OF RIGHT INTERNAL JUGULAR DIALYSIS CATHETER;  Surgeon: Serafina Mitchell, MD;  Location: [facility];  Service: Vascular;  Laterality: Right;  . LAPAROSCOPIC REPOSITIONING CAPD CATHETER N/A 06/16/2014   Procedure: LAPAROSCOPIC REPOSITIONING CAPD CATHETER;  Surgeon: Ralene Ok, MD;  Location: [facility];  Service: General;  Laterality: N/A;  . LAPAROSCOPY N/A 05/04/2015   Procedure: LAPAROSCOPY DIAGNOSTIC LYSIS OF ADHESIONS;  Surgeon: Coralie Keens, MD;  Location: [facility];  Service: General;  Laterality: N/A;  . MINOR REMOVAL OF PERITONEAL DIALYSIS CATHETER N/A 04/28/2015   Procedure:  REMOVAL OF PERITONEAL DIALYSIS CATHETER;  Surgeon: Coralie Keens, MD;  Location: [facility];  Service: General;  Laterality: N/A;  . NEPHRECTOMY Left 1974  . RENAL BIOPSY Right 2012  . REVISON OF ARTERIOVENOUS FISTULA Right 05/26/2015   Procedure: SUPERFICIALIZATION OF ARTERIOVENOUS FISTULA WITH SIDE BRANCH LIGATIONS;  Surgeon: Serafina Mitchell, MD;  Location: [facility];  Service: Vascular;  Laterality: Right;  . SKIN CANCER EXCISION     right shoulder  . SVT ABLATION N/A 11/23/2016   Procedure: SVT Ablation;  Surgeon: Will Meredith Leeds, MD;  Location: [facility];  Service: Cardiovascular;  Laterality: N/A;  . UMBILICAL HERNIA REPAIR N/A 05/18/2014   Procedure: HERNIA REPAIR UMBILICAL ADULT;  Surgeon: Ralene Ok, MD;  Location: [facility];  Service: General;  Laterality: N/A;  . UMBILICAL HERNIA REPAIR N/A 04/28/2015   Procedure: UMBILICAL HERNIA REPAIR WITH MESH;  Surgeon: Coralie Keens, MD;  Location: [facility];  Service: General;  Laterality: N/A;    Current Outpatient Prescriptions  Medication Sig Dispense Refill  . amLODipine (NORVASC) 5 MG tablet Take 5 mg by mouth 2 (two) times daily.    Marland Kitchen aspirin EC 81 MG tablet Take 81 mg by mouth every morning.    . DULoxetine (CYMBALTA) 30  MG capsule Take 30 mg by mouth daily.     . fluocinonide cream (LIDEX) 9.50 % Apply 1 application topically daily as needed (Skin inflammation (hand, thigh, back)).     . furosemide (LASIX) 20 MG tablet Take 20 mg by mouth 2 (two) times daily.    Marland Kitchen gabapentin (NEURONTIN) 100 MG capsule Take 200 mg by mouth 2 (two) times daily. 200 IN THE AM AND 300 IN THE PM    . glucose blood (ONETOUCH VERIO) test strip Use to check blood sugars up to 6 times daily 600 each 1  . HUMULIN R 500 UNIT/ML injection Using a U-100 1cc/31G/5/16" INSULIN SYRINGE, draw up and inject 5-18 units into the skin three times a day 30 minutes prior to each meal 10 mL 1  . insulin aspart (NOVOLOG FLEXPEN) 100 UNIT/ML FlexPen 10 units before each meal (Patient taking differently: COUNT CARBS) 15 mL 1  . Insulin Pen Needle (B-D UF III MINI PEN NEEDLES) 31G X 5 MM MISC USE 4 PER  DAY TO INJECT INSULIN. 400 each 1  . Insulin Syringe-Needle U-100 (BD INSULIN SYRINGE ULTRAFINE) 31G X 5/16" 0.3 ML MISC Use to inject insulin 2-3 times per day 300 each 1  . levothyroxine (SYNTHROID, LEVOTHROID) 100 MCG tablet Take 1 tablet (100 mcg total) by mouth daily. 90 tablet 1  . metoprolol succinate (TOPROL-XL) 25 MG 24 hr tablet Take 50 mg by mouth 2 (two) times daily.    . mycophenolate (CELLCEPT) 250 MG capsule Take 250 mg by mouth 4 (four) times daily.    Glory Rosebush DELICA LANCETS FINE MISC Use to check blood sugars 4-6 times daily 400 each 1  . polyethylene glycol (MIRALAX / GLYCOLAX) packet Take 17 g by mouth daily as needed.     . predniSONE (DELTASONE) 5 MG tablet Take 5 mg by mouth daily with breakfast.    . rosuvastatin (CRESTOR) 10 MG tablet Take 1 tablet (10 mg total) by mouth daily. 90 tablet 1  . senna-docusate (SENOKOT-S) 8.6-50 MG tablet Take 1 tablet by mouth as needed for mild constipation.    . sulfamethoxazole-trimethoprim (BACTRIM DS,SEPTRA DS) 800-160 MG tablet Take 1 tablet by mouth daily.    . Tacrolimus 4 MG TB24 Take 4 mg by  mouth 2 (two) times daily.     . tamsulosin (FLOMAX) 0.4 MG CAPS capsule Take 0.4 mg by mouth 2 (two) times daily.    . traMADol (ULTRAM) 50 MG tablet Take 1 tablet (50 mg total) by mouth every 6 (six) hours as needed. (Patient taking differently: Take 50-100 mg by mouth every 6 (six) hours as needed (for pain). ) 15 tablet 0  . traZODone (DESYREL) 50 MG tablet Take 50-100 mg by mouth at bedtime as needed for sleep.     . TRESIBA FLEXTOUCH 100 UNIT/ML SOPN FlexTouch Pen INJECT 30 UNITS INTO THE SKIN EVERY MORNING (Patient taking differently: INJECT 16 UNITS INTO THE SKIN EVERY MORNING) 15 pen 1  . valGANciclovir (VALCYTE) 450 MG tablet Take 450 mg by mouth daily.    Marland Kitchen zolpidem (AMBIEN) 10 MG tablet Take 10-20 mg by mouth at bedtime as needed for sleep.      No current facility-administered medications for this visit.     Allergies:   Patient has no known allergies.   Social History:  The patient  reports that he quit smoking about 4 years ago. His smoking use included Cigars. He has never used smokeless tobacco. He reports that he drinks alcohol. He reports that he does not use drugs.   Family History:  The patient's family history includes Colon polyps in his father.  ROS:  Please see the history of present illness.  All other systems are reviewed and otherwise negative.   PHYSICAL EXAM:  VS:  BP 114/66   Pulse 80   Ht 6' (1.829 m)   Wt 274 lb (124.3 kg)   BMI 37.16 kg/m  BMI: Body mass index is 37.16 kg/m. Well nourished, well developed, in no acute distress  HEENT: normocephalic, atraumatic  Neck: no JVD, carotid bruits or masses Cardiac:  RRR; no significant murmurs, no rubs, or gallops Lungs:  CTA b/l, no wheezing, rhonchi or rales  Abd: soft, nontender MS: no deformity or atrophy Ext: trace edema b/l Skin: warm and dry, no rash Neuro:  No gross deficits appreciated Psych: euthymic mood, full affect    EKG:  Not done today  11/23/16, EPS CONCLUSIONS:  1. Sinus rhythm  upon presentation.  2. Negative EP study without inducible arrhythmia  3. No early apparent complications.   10/23/16: TTE Study Conclusions - Left ventricle: The cavity size was normal. Wall thickness was   increased in a pattern of moderate LVH. Systolic function was   normal. The estimated ejection fraction was in the range of 60%   to 65%. Wall motion was normal; there were no regional wall   motion abnormalities. Doppler parameters are consistent with   abnormal left ventricular relaxation (grade 1 diastolic   dysfunction). - Aortic valve: Trileaflet; moderately thickened, moderately   calcified leaflets. Valve mobility was restricted. There was mild   to moderate regurgitation. Peak velocity (S): 271 cm/s. Mean   gradient (S): 17 mm Hg. Valve area (VTI): 3.68 cm^2. Valve area   (Vmax): 3.54 cm^2. Valve area (Vmean): 3.19 cm^2. - Aorta: Ascending aortic diameter: 39 mm (S). - Ascending aorta: The ascending aorta was mildly dilated. - Mitral valve: Calcified annulus. - Left atrium: The atrium was mildly dilated. - Right ventricle: The cavity size was mildly dilated. Wall   thickness was normal. - Right atrium: The atrium was mildly dilated. Impressions: - Compared to the prior study, there has been no significant   interval change.  10/19/15: stress echo Study Conclusions - Stress ECG conclusions: There were no stress arrhythmias or   conduction abnormalities. 1 mm ST segment depression with   inverted [place] in recovery. - Staged echo: There was no echocardiographic evidence for   stress-induced ischemia. - Impressions: [place] study with normal echo images and positive   ECG in recovery Consider f/u cardiac CT or other imaging Impressions: - [place] study with normal echo images and positive ECG in   recovery Consider f/u cardiac CT or other imaging  Recent Labs: 11/13/2016: ALT 13; Hemoglobin 10.7; Platelets 205 05/28/2017: BUN 27; Creatinine, Ser 1.80; Potassium 4.8;  Sodium 138; TSH 1.30  05/28/2017: Cholesterol 137; HDL 49.60; LDL Cholesterol 73; Total CHOL/HDL Ratio 3; Triglycerides 71.0; VLDL 14.2   CrCl cannot be calculated (Patient's most recent lab result is older than the maximum 21 days allowed.).   Wt Readings from Last 3 Encounters:  06/25/17 274 lb (124.3 kg)  05/30/17 270 lb 6.4 oz (122.7 kg)  04/23/17 271 lb 3.2 oz (123 kg)     Other studies reviewed: Additional studies/records reviewed today include: summarized above  ASSESSMENT AND PLAN:  1. SVT     No recurrent symptoms/tachycardia  2. HTN     Looks good here, rechecked manually and confirmed.     He would like to havehis BP cuff from home checked.       Will scheduled an RN visit to check his home machine accuracy though he will continue BP management with his nephrologist  3. ESRF now s/p kidney transplant     Trace edema today, he says since his transplant, and was started on lasix by his nephrologist   Disposition: F/u with Dr. Claiborne Billings as recommended by his, as well as his transplant/nephrology team, EP can see him PRN.  He would like to have an annual visit.  Current medicines are reviewed at length with the patient today.  The patient did not have any concerns regarding medicines.  Venetia Night, PA-C 06/25/2017 8:53 AM     [address]  [place] [address] [phone] (office)  [phone] (fax)

## 2017-06-25 ENCOUNTER — Ambulatory Visit (INDEPENDENT_AMBULATORY_CARE_PROVIDER_SITE_OTHER): Payer: Medicare Other | Admitting: Physician Assistant

## 2017-06-25 ENCOUNTER — Encounter: Payer: Self-pay | Admitting: Cardiovascular Disease

## 2017-06-25 ENCOUNTER — Ambulatory Visit: Payer: Medicare Other | Admitting: Cardiovascular Disease

## 2017-06-25 ENCOUNTER — Ambulatory Visit (INDEPENDENT_AMBULATORY_CARE_PROVIDER_SITE_OTHER): Payer: Medicare Other | Admitting: Cardiovascular Disease

## 2017-06-25 VITALS — BP 110/80 | HR 71 | Ht 72.0 in | Wt 275.0 lb

## 2017-06-25 VITALS — BP 114/66 | HR 80 | Ht 72.0 in | Wt 274.0 lb

## 2017-06-25 DIAGNOSIS — I350 Nonrheumatic aortic (valve) stenosis: Secondary | ICD-10-CM | POA: Diagnosis not present

## 2017-06-25 DIAGNOSIS — Z9889 Other specified postprocedural states: Secondary | ICD-10-CM | POA: Diagnosis not present

## 2017-06-25 DIAGNOSIS — E118 Type 2 diabetes mellitus with unspecified complications: Secondary | ICD-10-CM | POA: Diagnosis not present

## 2017-06-25 DIAGNOSIS — Z940 Kidney transplant status: Secondary | ICD-10-CM

## 2017-06-25 DIAGNOSIS — I10 Essential (primary) hypertension: Secondary | ICD-10-CM | POA: Diagnosis not present

## 2017-06-25 DIAGNOSIS — I471 Supraventricular tachycardia: Secondary | ICD-10-CM | POA: Diagnosis not present

## 2017-06-25 DIAGNOSIS — Z794 Long term (current) use of insulin: Secondary | ICD-10-CM | POA: Diagnosis not present

## 2017-06-25 DIAGNOSIS — Z9989 Dependence on other enabling machines and devices: Secondary | ICD-10-CM | POA: Diagnosis not present

## 2017-06-25 DIAGNOSIS — Z8679 Personal history of other diseases of the circulatory system: Secondary | ICD-10-CM | POA: Diagnosis not present

## 2017-06-25 DIAGNOSIS — G4733 Obstructive sleep apnea (adult) (pediatric): Secondary | ICD-10-CM

## 2017-06-25 DIAGNOSIS — E785 Hyperlipidemia, unspecified: Secondary | ICD-10-CM | POA: Diagnosis not present

## 2017-06-25 NOTE — Patient Instructions (Addendum)
Medication Instructions:  Your physician recommends that you continue on your current medications as directed. Please refer to the Current Medication list given to you today.  Follow-Up: Your physician recommends that you schedule a follow-up appointment in: 2 MONTHS with Dr. Claiborne Billings   Any Other Special Instructions Will Be Listed Below (If Applicable).     If you need a refill on your cardiac medications before your next appointment, please call your pharmacy.

## 2017-06-25 NOTE — Progress Notes (Signed)
Patient ID: Robert [name], male   DOB: [birth year], 56 y.o.   MRN: [id number]     HPI: Robert [name], is a 56 y.o. male who is a former patient of Dr. Susa Griffins. He presents for a 4 month follow-up evaluation.  Robert [name]  has a complex medical history that includes a congenital nonfunctioning left kidney for which he underwent left nephrectomy at age 28. He has a history of obesity, diabetes mellitus, and has been demonstrated to have proteinuria of his remaining single kidney, being status post open renal biopsy in [place] in November 2010. This revealed focal segmental glomerular sclerosis. At that time he was seen by Dr. [name] Kinsman in the nephrology department at [facility]. He sees Dr. Kathrene Bongo in [place] for his kidney insufficiency and has been on ARB therapy in the past in an attempt to reduce proteinuria. He sees Dr. Lucianne Muss for his diabetes mellitus. He also has a history of hypothyroidism, hyperlipidemia, anxiety and depression. He has a history of prior supraventricular tachycardia requiring hospitalization in 2007 as was felt to be possibly AV nodal reentrant rhythm.  In addition, he has a history of complex sleep apnea initially diagnosed in 2007 with an AHI at that time of 56.7 per hour and during REM sleep this increased to 65.7 per hour. At that time he dropped his oxygen 84% and had loud snoring. He had been on BiPAP therapy. He underwent a 5 year followup split-night study in 2012 which again confirmed severe sleep apnea with an AHI of 60.7 on the baseline portion of the study. He was unable to achieve REM sleep at that time. Oxygen saturation during non-REM sleep at 83%. He was titrated utilizing BiPAP up to a setting of 16/12 cm water pressure. He does admit to continued residual daytime sleepiness despite using his BiPAP therapy.  An echo Doppler study in August 2012 showed mild concentric LVH with normal systolic function and  grade 1 diastolic dysfunction. He had trace mitral and tricuspid insufficiency as well as pulmonic insufficiency. A nuclear perfusion study in August 2012 showed normal perfusion.  [name], [name].  He is on the [facility] transplant list.  In January 2017 he had an stress echo at [facility] as part of his transplant evaluation.  I do not have the results of this evaluation.  He has had some difficulty with a steal syndrome in his right hand since he had AV fistulas inserted in the forearm and most recently now in the upper arm.  He continues to use BiPAP with 100% compliance.  He has a EchoStar unit.  He obtains his CPAP supplies online because they are cheaper, but had seen [facility].  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  A download in the office from 05/28/2016 through 07/26/2016.  He is meeting compliance standards with usage at 85% usage stays greater than 4 hours at 77%.  He is averaging 6 hours and 43 minutes per night.  He is set at an IPAP pressure of 16 and EPAP pressure of 12.  His AHI was mildly increased at 6.5 with 5.2 per hour of apneic events and 1.3 of hypopnea events.  When I last saw him he had stopped taken Bystolic since his blood pressure had become low at the end of dialysis.  He had been on 5 mg on nondialysis days.  He tells me his aspirin was discontinued  because he was continuously having issues with bleeding following his dialysis when the catheter was removed.   He had an episode of SVT in December 2017, which was successfully aborted with vagal maneuvers.  In the emergency room, he had 2-3 more recurrences of SVT, of which, again, all aborted with vagal maneuvers.  He was started on verapamil and discharge from the hospital.  He has seen Dr. Curt Bears and had an EP study with attempted ablation, but apparently he was not able to be induced and ablation was not performed.   He denies any recurrent episodes of SVT.  He  tells me that he is scheduled to undergo renal transplantation with a living donor at [facility] on 02/25/2017.  He denies any episodes of chest pain.  He denies shortness of breath.  Remotely, he may of had some mild nonexertional musculoskeletal discomfort but has not experienced any of this recently.  Reportedly, his remote stress echo was negative for ischemia, although mild ST changes were noted in recovery.  He continues to be asymptomatic.  Since I last saw him, he underwent a kidney transplant from a living donor on 02/25/2017  at [facility].  He tolerated surgery well but has developed a postoperative inguinal hernia, which will ultimately need to be surgically repaired.  He received a new BiPAP machine from [facility] 2 weeks ago.  He has a full face mask, but admits to increased leak.  The episodes of SVT, dizziness, or chest pain.  He was seen by Adron Bene, PA-C today for EP follow-up and was stable without recurrent SVT. He presents for follow-up evaluation.  Past Medical History:  Diagnosis Date  . Anemia    low hgb at present  . Arthritis    FEET  . Blood transfusion without reported diagnosis 1974   kidney removed - L  . Cancer ([place])    skin ca right shoulder, plastic dsyplasia, pre-Ca polpys removed on Colonoscopy- 07/2014  . Colon polyps 07/23/2014   Tubular adenomas x 5  . Constipation   . Diabetes mellitus without complication (HCC)    Type 2  . Difficult intubation    unsure of actual problem but it was during the January 28, 2015 procedure.  Marland Kitchen Dysrhythmia   . Eczema   . ESRD (end stage renal disease) ([place])    hEMO mwf  . Headache(784.0)    slight dull h/a due kidney failure  . Hyperlipidemia   . Hypertension    SVT h/o , followed by Dr. Claiborne Billings  . Hypothyroidism   . Macular degeneration   . Neuropathy    right hand and both feet  . PAT (paroxysmal atrial tachycardia) ([place])   . PSVT (paroxysmal supraventricular tachycardia) ([place])   . SBO (small bowel obstruction)  ([place])   . Shortness of breath    with exertion  . Sleep apnea 06/18/11   split-night sleep study- [facility]., BiPAP- 13-16  . Thyroid disease   . Umbilical hernia    will repair with thi surgery    Past Surgical History:  Procedure Laterality Date  . AV FISTULA PLACEMENT Right 02/25/2015   Procedure: RIGHT ARTERIOVENOUS (AV) FISTULA CREATION;  Surgeon: Serafina Mitchell, MD;  Location: [facility] [facility];  Service: Vascular;  Laterality: Right;  . AV FISTULA PLACEMENT Right 09/15/2015   Procedure: ARTERIOVENOUS (AV) FISTULA CREATION- RIGHT ARM;  Surgeon: Serafina Mitchell, MD;  Location: [place];  Service: Vascular;  Laterality: Right;  . CAPD INSERTION N/A 05/18/2014  Procedure: LAPAROSCOPIC INSERTION CONTINUOUS AMBULATORY PERITONEAL DIALYSIS  (CAPD) CATHETER;  Surgeon: Axel Filler, MD;  [facility];  Service: General;  Laterality: N/A;  . COLONOSCOPY    . COLONOSCOPY WITH PROPOFOL N/A 07/12/2016   Procedure: COLONOSCOPY WITH PROPOFOL;  Surgeon: Rachael Fee, MD;  Location: [facility];  Service: Endoscopy;  Laterality: N/A;  . declotting of fistula  08/2015  . ESOPHAGOGASTRODUODENOSCOPY (EGD) WITH PROPOFOL N/A 07/12/2016   Procedure: ESOPHAGOGASTRODUODENOSCOPY (EGD) WITH PROPOFOL;  Surgeon: Rachael Fee, MD;  [facility];  Service: Endoscopy;  Laterality: N/A;  . FISTULA SUPERFICIALIZATION Right 02/07/2016   Procedure: RIIGHT UPPER ARM FISTULA SUPERFICIALIZATION;  Surgeon: Sherren Kerns, MD;  Location: [facility] OR;  Service: Vascular;  Laterality: Right;  . IJ catheter insertion    . INSERTION OF DIALYSIS CATHETER Right 02/25/2015   Procedure: INSERTION OF RIGHT INTERNAL JUGULAR DIALYSIS CATHETER;  Surgeon: Nada Libman, MD;  [facility];  Service: Vascular;  Laterality: Right;  . LAPAROSCOPIC REPOSITIONING CAPD CATHETER N/A 06/16/2014   Procedure: LAPAROSCOPIC REPOSITIONING CAPD CATHETER;  Surgeon: Axel Filler, MD;  [facility];  Service: General;   Laterality: N/A;  . LAPAROSCOPY N/A 05/04/2015   Procedure: LAPAROSCOPY DIAGNOSTIC LYSIS OF ADHESIONS;  Surgeon: Abigail Miyamoto, MD;  [facility];  Service: General;  Laterality: N/A;  . MINOR REMOVAL OF PERITONEAL DIALYSIS CATHETER N/A 04/28/2015   Procedure:  REMOVAL OF PERITONEAL DIALYSIS CATHETER;  Surgeon: Abigail Miyamoto, MD;  [facility];  Service: General;  Laterality: N/A;  . NEPHRECTOMY Left 1974  . RENAL BIOPSY Right 2012  . REVISON OF ARTERIOVENOUS FISTULA Right 05/26/2015   Procedure: SUPERFICIALIZATION OF ARTERIOVENOUS FISTULA WITH SIDE BRANCH LIGATIONS;  Surgeon: Nada Libman, MD;  [facility];  Service: Vascular;  Laterality: Right;  . SKIN CANCER EXCISION     right shoulder  . SVT ABLATION N/A 11/23/2016   Procedure: SVT Ablation;  Surgeon: Will Jorja Loa, MD;  [facility];  Service: Cardiovascular;  Laterality: N/A;  . UMBILICAL HERNIA REPAIR N/A 05/18/2014   Procedure: HERNIA REPAIR UMBILICAL ADULT;  Surgeon: Axel Filler, MD;  Location: [facility] OR;  Service: General;  Laterality: N/A;  . UMBILICAL HERNIA REPAIR N/A 04/28/2015   Procedure: UMBILICAL HERNIA REPAIR WITH MESH;  Surgeon: Abigail Miyamoto, MD;  [facility];  Service: General;  Laterality: N/A;   except left nephrectomy in 1974.  No Known Allergies  Current Outpatient Prescriptions  Medication Sig Dispense Refill  . amLODipine (NORVASC) 5 MG tablet Take 10 mg by mouth daily.    Marland Kitchen aspirin EC 81 MG tablet Take 81 mg by mouth every morning.    . DULoxetine (CYMBALTA) 30 MG capsule Take 30 mg by mouth daily.     . fluocinonide cream (LIDEX) 0.05 % Apply 1 application topically daily as needed (Skin inflammation (hand, thigh, back)).     . furosemide (LASIX) 20 MG tablet Take 40 mg by mouth daily.     Marland Kitchen gabapentin (NEURONTIN) 100 MG capsule Take 200 mg by mouth 2 (two) times daily. 200 IN THE AM AND 300 IN THE PM    . glucose blood (ONETOUCH VERIO) test strip Use to check blood  sugars up to 6 times daily 600 each 1  . HUMULIN R 500 UNIT/ML injection Using a U-100 1cc/31G/5/16" INSULIN SYRINGE, draw up and inject 5-18 units into the skin three times a day 30 minutes prior to each meal 10 mL 1  . insulin aspart (NOVOLOG  FLEXPEN) 100 UNIT/ML FlexPen 10 units before each meal (Patient taking differently: COUNT CARBS) 15 mL 1  . Insulin Pen Needle (B-D UF III MINI PEN NEEDLES) 31G X 5 MM MISC USE 4 PER DAY TO INJECT INSULIN. 400 each 1  . Insulin Syringe-Needle U-100 (BD INSULIN SYRINGE ULTRAFINE) 31G X 5/16" 0.3 ML MISC Use to inject insulin 2-3 times per day 300 each 1  . levothyroxine (SYNTHROID, LEVOTHROID) 100 MCG tablet Take 1 tablet (100 mcg total) by mouth daily. 90 tablet 1  . metoprolol succinate (TOPROL-XL) 25 MG 24 hr tablet Take 50 mg by mouth 2 (two) times daily.    . mycophenolate (CELLCEPT) 250 MG capsule Take 1,000 mg by mouth 2 (two) times daily.     Letta Pate DELICA LANCETS FINE MISC Use to check blood sugars 4-6 times daily 400 each 1  . polyethylene glycol (MIRALAX / GLYCOLAX) packet Take 17 g by mouth daily as needed.     . predniSONE (DELTASONE) 5 MG tablet Take 5 mg by mouth daily with breakfast.    . rosuvastatin (CRESTOR) 10 MG tablet Take 1 tablet (10 mg total) by mouth daily. 90 tablet 1  . senna-docusate (SENOKOT-S) 8.6-50 MG tablet Take 1 tablet by mouth as needed for mild constipation.    . sulfamethoxazole-trimethoprim (BACTRIM DS,SEPTRA DS) 800-160 MG tablet Take 1 tablet by mouth 3 (three) times a week. Monday Wednesday and Friday    . Tacrolimus 4 MG TB24 Take 4 mg by mouth 2 (two) times daily.     . tamsulosin (FLOMAX) 0.4 MG CAPS capsule Take 0.4 mg by mouth 2 (two) times daily.    . traMADol (ULTRAM) 50 MG tablet Take 1 tablet (50 mg total) by mouth every 6 (six) hours as needed. (Patient taking differently: Take 50-100 mg by mouth every 6 (six) hours as needed (for pain). ) 15 tablet 0  . traZODone (DESYREL) 50 MG tablet Take 50-100 mg by  mouth at bedtime as needed for sleep.     . TRESIBA FLEXTOUCH 100 UNIT/ML SOPN FlexTouch Pen INJECT 30 UNITS INTO THE SKIN EVERY MORNING (Patient taking differently: INJECT 16 UNITS INTO THE SKIN EVERY MORNING) 15 pen 1  . valGANciclovir (VALCYTE) 450 MG tablet Take 450 mg by mouth daily.    Marland Kitchen zolpidem (AMBIEN) 10 MG tablet Take 10-20 mg by mouth at bedtime as needed for sleep.      No current facility-administered medications for this visit.    Social history is notable in that he is married for 29 years. His 3 children. He has a degree as a chemistry. 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 He does drink occasional alcohol. He has smoked cigars.  ROS General: Negative; No fevers, chills, or night sweats; positive for morbid obesity HEENT: Negative; No changes in vision or hearing, sinus congestion, difficulty swallowing Pulmonary: Negative; No cough, wheezing, shortness of breath, hemoptysis Cardiovascular: Negative; No chest pain, presyncope, syncope, palpitations GI: Negative; No nausea, vomiting, diarrhea, or abdominal pain GU: Negative; No dysuria, hematuria, or difficulty voiding Renal: no longer on dialysis, status post renal transplant Musculoskeletal: Negative; no myalgias, joint pain, or weakness Hematologic/Oncology: Negative; no easy bruising, bleeding Endocrine: Positive for diabetes and hypothyroidism Neuro: Negative; no changes in balance, headaches Skin: Negative; No rashes or skin lesions Psychiatric: Negative; No behavioral problems, depression Sleep: Positive for OSA on BiPAP; no daytime sleepiness, hypersomnolence, bruxism, restless legs, hypnogognic hallucinations, no cataplexy Other comprehensive 14 point system review is negative.  PE  BP 110/80   Pulse 71   Ht 6' (1.829 m)   Wt 275 lb (124.7 kg)   BMI 37.30 kg/m    Repeat blood pressure by me 120/78  Wt Readings from Last 3 Encounters:  06/25/17 275 lb (124.7 kg)  06/25/17 274 lb (124.3 kg)  05/30/17  270 lb 6.4 oz (122.7 kg)     Physical Exam BP 110/80   Pulse 71   Ht 6' (1.829 m)   Wt 275 lb (124.7 kg)   BMI 37.30 kg/m  General: Alert, oriented, no distress.  Skin: normal turgor, no rashes, warm and dry HEENT: Normocephalic, atraumatic. Pupils equal round and reactive to light; sclera anicteric; extraocular muscles intact; Nose without nasal septal hypertrophy Mouth/Parynx benign; Mallinpatti scale 3 Neck: No JVD, no carotid bruits; normal carotid upstroke Lungs: clear to ausculatation and percussion; no wheezing or rales Chest wall: without tenderness to palpitation Heart: PMI not displaced, RRR, s1 s2 normal, 2/6 aortic systolic murmur, 1/6 aortic regurgitation  no rubs, gallops, thrills, or heaves Abdomen: antral adiposity.soft, nontender; no hepatosplenomehaly, BS+; abdominal aorta nontender and not dilated by palpation. Back: no CVA tenderness Pulses 2+ Musculoskeletal: full range of motion, normal strength, no joint deformities Extremities: no clubbing cyanosis or edema, Homan's sign negative  Neurologic: grossly nonfocal; Cranial nerves grossly wnl Psychologic: Normal mood and affect   ECG (independently read by me): normal sinus rhythm at 71 bpm.  No ST segment changes.  Normal intervals.  May 2018 ECG (independently read by me): Normal sinus rhythm at 89 bpm.  No STT changes .  PR interval 126 ms.  QTc interval 457 ms. Normal ECG.  October 2017 ECG (independently read by me): Sinus tachycardia 101 bpm.  April 2017 ECG (independently read by me): Sinus tachycardia 100 bpm.  2.  TC interval 45 ms.  November 2014 ECG: Normal sinus 34 beats per minute. PR interval 138 ms. QT interval 448 ms.  LABS: BMP Latest Ref Rng & Units 05/28/2017 04/17/2017 11/13/2016  Glucose 70 - 99 mg/dL 159(H) 233(H) 108(H)  BUN 6 - 23 mg/dL 27(H) - 49(H)  Creatinine 0.40 - 1.50 mg/dL 1.80(H) - 8.36(H)  BUN/Creat Ratio 9 - 20 - - 6(L)  Sodium 135 - 145 mEq/L 138 - 140  Potassium 3.5 - 5.1  mEq/L 4.8 - 4.5  Chloride 96 - 112 mEq/L 110 - 95(L)  CO2 19 - 32 mEq/L 24 - 22  Calcium 8.4 - 10.5 mg/dL 10.1 - 8.4(L)   Hepatic Function Latest Ref Rng & Units 11/13/2016 03/15/2016 05/13/2015  Total Protein 6.5 - 8.1 g/dL - - -  Albumin 3.5 - 5.0 g/dL - - 3.6  AST 15 - 41 U/L - - -  ALT 0 - 53 U/L 13 16 -  Alk Phosphatase 38 - 126 U/L - - -  Total Bilirubin 0.3 - 1.2 mg/dL - - -    CBC Latest Ref Rng & Units 11/13/2016 09/15/2016 09/14/2016  WBC 3.4 - 10.8 x10E3/uL 6.1 5.3 7.1  Hemoglobin 13.0 - 17.7 g/dL 10.7(L) 10.7(L) 11.2(L)  Hematocrit 37.5 - 51.0 % 31.7(L) 32.4(L) 33.2(L)  Platelets 150 - 379 x10E3/uL 205 181 197    Lab Results  Component Value Date   MCV 93 11/13/2016   MCV 95.0 09/15/2016   MCV 94.9 09/14/2016   Lab Results  Component Value Date   TSH 1.30 05/28/2017   Lab Results  Component Value Date   HGBA1C 6.3 05/28/2017   Lipid Panel  Component Value Date/Time   CHOL 137 05/28/2017 1004   TRIG 71.0 05/28/2017 1004   HDL 49.60 05/28/2017 1004   CHOLHDL 3 05/28/2017 1004   VLDL 14.2 05/28/2017 1004   LDLCALC 73 05/28/2017 1004    RADIOLOGY: No results found.  IMPRESSION:  1. S/P ablation operation for arrhythmia   2. Essential hypertension   3. OSA on CPAP   4. Type 2 diabetes mellitus with complication, with long-term current use of insulin ([place])   5. SVT (supraventricular tachycardia) ([place])   6. Mild aortic stenosis   7. Hyperlipidemia with target LDL less than 70   8. Morbid obesity, unspecified obesity type ([place])   9. History of renal transplantation     ASSESSMENT AND PLAN: Robert [name] is a 56 year old Caucasian male is a history of morbid obesity, hypertension, diabetes mellitus, hyperlipidemia, and end-stage kidney disease on who had been on hemodialysis until undergoing renal transplantation from a living donor, which was done at [facility] on 02/25/2017.  He tells me he developed a postoperative inguinal hernia for which he ultimately will  require surgical revision.  He has a history of severe complex sleep apnea.  He had recently received a new machine 2 weeks ago from [facility].  In the past, he had been on BiPAP pressures up to 18/14 which were necessary to treat his sleep disordered breathing.  He has not had any further episodes of SVT and appears stable from that perspective and continues to be on metoprolol 50 mg twice a day.  His blood pressure today is stable.  Additionally, on amlodipine 10 mg and he has been taking furosemide 40 mg daily.  He is now on Valcyte, prednisone and CellCept following his recent transplantation in addition to tacrolimus.  He is diabetic on insulin.  He has a history of hypothyroidism and is tolerating Synthroid 100 g.  Presently, he does not have any signs of CHF.  He does have mild aortic stenosis with mild-to-moderate aortic insufficiency, which was documented on his last echo in January 2018. He is tolerating Crestor for hyperlipidemia with target LDL less than 70.  I will see him in 2 months for follow up evaluation.  At that time a new download will be obtained to reassess compliance inadequacy with his new BiPAP machine.  Time spent: 25 minutes Troy Sine, MD, [facility]  06/27/2017 5:44 PM

## 2017-06-25 NOTE — Patient Instructions (Addendum)
Medication Instructions:   Your physician recommends that you continue on your current medications as directed. Please refer to the Current Medication list given to you today.   If you need a refill on your cardiac medications before your next appointment, please call your pharmacy.  Labwork: NONE ORDERED  TODAY    Testing/Procedures:NONE ORDERED  TODAY     Follow-Up: NEXT AVAIABLE  NURSE VISIT  FOR BLOOD PRESSURE CHECK ALONG WITH HOME DIGITAL MACHINE COMPARISON AND CALIBRATION.    Your physician wants you to follow-up in: [place] will receive a reminder letter in the mail two months in advance. If you don't receive a letter, please call our office to schedule the follow-up appointment.     Any Other Special Instructions Will Be Listed Below (If Applicable).

## 2017-06-26 ENCOUNTER — Ambulatory Visit: Payer: BLUE CROSS/BLUE SHIELD | Admitting: Cardiology

## 2017-06-27 ENCOUNTER — Encounter: Payer: Self-pay | Admitting: Endocrinology

## 2017-06-27 DIAGNOSIS — Z125 Encounter for screening for malignant neoplasm of prostate: Secondary | ICD-10-CM | POA: Diagnosis not present

## 2017-06-27 DIAGNOSIS — E1129 Type 2 diabetes mellitus with other diabetic kidney complication: Secondary | ICD-10-CM | POA: Diagnosis not present

## 2017-06-27 DIAGNOSIS — I10 Essential (primary) hypertension: Secondary | ICD-10-CM | POA: Diagnosis not present

## 2017-06-28 ENCOUNTER — Ambulatory Visit: Payer: BLUE CROSS/BLUE SHIELD

## 2017-06-28 DIAGNOSIS — Z905 Acquired absence of kidney: Secondary | ICD-10-CM | POA: Diagnosis not present

## 2017-06-28 DIAGNOSIS — D899 Disorder involving the immune mechanism, unspecified: Secondary | ICD-10-CM | POA: Diagnosis not present

## 2017-06-28 DIAGNOSIS — J948 Other specified pleural conditions: Secondary | ICD-10-CM | POA: Diagnosis not present

## 2017-06-28 DIAGNOSIS — E669 Obesity, unspecified: Secondary | ICD-10-CM | POA: Diagnosis present

## 2017-06-28 DIAGNOSIS — R197 Diarrhea, unspecified: Secondary | ICD-10-CM | POA: Insufficient documentation

## 2017-06-28 DIAGNOSIS — Z6838 Body mass index (BMI) 38.0-38.9, adult: Secondary | ICD-10-CM | POA: Diagnosis not present

## 2017-06-28 DIAGNOSIS — Z792 Long term (current) use of antibiotics: Secondary | ICD-10-CM | POA: Diagnosis not present

## 2017-06-28 DIAGNOSIS — R443 Hallucinations, unspecified: Secondary | ICD-10-CM | POA: Diagnosis not present

## 2017-06-28 DIAGNOSIS — Z794 Long term (current) use of insulin: Secondary | ICD-10-CM | POA: Diagnosis not present

## 2017-06-28 DIAGNOSIS — Z940 Kidney transplant status: Secondary | ICD-10-CM | POA: Diagnosis not present

## 2017-06-28 DIAGNOSIS — G4733 Obstructive sleep apnea (adult) (pediatric): Secondary | ICD-10-CM | POA: Diagnosis present

## 2017-06-28 DIAGNOSIS — Z87891 Personal history of nicotine dependence: Secondary | ICD-10-CM | POA: Diagnosis not present

## 2017-06-28 DIAGNOSIS — Z298 Encounter for other specified prophylactic measures: Secondary | ICD-10-CM | POA: Diagnosis not present

## 2017-06-28 DIAGNOSIS — E785 Hyperlipidemia, unspecified: Secondary | ICD-10-CM | POA: Diagnosis present

## 2017-06-28 DIAGNOSIS — N186 End stage renal disease: Secondary | ICD-10-CM | POA: Diagnosis present

## 2017-06-28 DIAGNOSIS — I120 Hypertensive chronic kidney disease with stage 5 chronic kidney disease or end stage renal disease: Secondary | ICD-10-CM | POA: Diagnosis present

## 2017-06-28 DIAGNOSIS — E119 Type 2 diabetes mellitus without complications: Secondary | ICD-10-CM | POA: Diagnosis not present

## 2017-06-28 DIAGNOSIS — E1122 Type 2 diabetes mellitus with diabetic chronic kidney disease: Secondary | ICD-10-CM | POA: Diagnosis present

## 2017-06-28 DIAGNOSIS — I10 Essential (primary) hypertension: Secondary | ICD-10-CM | POA: Diagnosis not present

## 2017-06-28 DIAGNOSIS — E1142 Type 2 diabetes mellitus with diabetic polyneuropathy: Secondary | ICD-10-CM | POA: Diagnosis present

## 2017-06-28 DIAGNOSIS — K4090 Unilateral inguinal hernia, without obstruction or gangrene, not specified as recurrent: Secondary | ICD-10-CM | POA: Diagnosis present

## 2017-06-28 DIAGNOSIS — T451X5A Adverse effect of antineoplastic and immunosuppressive drugs, initial encounter: Secondary | ICD-10-CM | POA: Diagnosis present

## 2017-06-29 DIAGNOSIS — R251 Tremor, unspecified: Secondary | ICD-10-CM

## 2017-06-29 HISTORY — DX: Tremor, unspecified: R25.1

## 2017-06-30 DIAGNOSIS — K4090 Unilateral inguinal hernia, without obstruction or gangrene, not specified as recurrent: Secondary | ICD-10-CM

## 2017-06-30 HISTORY — DX: Unilateral inguinal hernia, without obstruction or gangrene, not specified as recurrent: K40.90

## 2017-07-04 DIAGNOSIS — E7800 Pure hypercholesterolemia, unspecified: Secondary | ICD-10-CM | POA: Diagnosis not present

## 2017-07-04 DIAGNOSIS — D631 Anemia in chronic kidney disease: Secondary | ICD-10-CM | POA: Diagnosis not present

## 2017-07-04 DIAGNOSIS — E1129 Type 2 diabetes mellitus with other diabetic kidney complication: Secondary | ICD-10-CM | POA: Diagnosis not present

## 2017-07-04 DIAGNOSIS — Z6837 Body mass index (BMI) 37.0-37.9, adult: Secondary | ICD-10-CM | POA: Diagnosis not present

## 2017-07-04 DIAGNOSIS — N2581 Secondary hyperparathyroidism of renal origin: Secondary | ICD-10-CM | POA: Diagnosis not present

## 2017-07-04 DIAGNOSIS — Z940 Kidney transplant status: Secondary | ICD-10-CM | POA: Diagnosis not present

## 2017-07-04 DIAGNOSIS — D8989 Other specified disorders involving the immune mechanism, not elsewhere classified: Secondary | ICD-10-CM | POA: Diagnosis not present

## 2017-07-04 DIAGNOSIS — I120 Hypertensive chronic kidney disease with stage 5 chronic kidney disease or end stage renal disease: Secondary | ICD-10-CM | POA: Diagnosis not present

## 2017-07-04 DIAGNOSIS — Z0000 Encounter for general adult medical examination without abnormal findings: Secondary | ICD-10-CM | POA: Diagnosis not present

## 2017-07-04 DIAGNOSIS — Z1389 Encounter for screening for other disorder: Secondary | ICD-10-CM | POA: Diagnosis not present

## 2017-07-04 DIAGNOSIS — Z794 Long term (current) use of insulin: Secondary | ICD-10-CM | POA: Diagnosis not present

## 2017-07-09 DIAGNOSIS — Z5181 Encounter for therapeutic drug level monitoring: Secondary | ICD-10-CM | POA: Diagnosis not present

## 2017-07-09 DIAGNOSIS — Z940 Kidney transplant status: Secondary | ICD-10-CM | POA: Diagnosis not present

## 2017-07-10 ENCOUNTER — Ambulatory Visit: Payer: Medicare Other | Admitting: Orthotics

## 2017-07-10 DIAGNOSIS — M2020 Hallux rigidus, unspecified foot: Secondary | ICD-10-CM

## 2017-07-10 DIAGNOSIS — E1149 Type 2 diabetes mellitus with other diabetic neurological complication: Secondary | ICD-10-CM

## 2017-07-10 DIAGNOSIS — M205X9 Other deformities of toe(s) (acquired), unspecified foot: Secondary | ICD-10-CM

## 2017-07-11 ENCOUNTER — Ambulatory Visit: Payer: BLUE CROSS/BLUE SHIELD | Admitting: Endocrinology

## 2017-07-16 ENCOUNTER — Ambulatory Visit (INDEPENDENT_AMBULATORY_CARE_PROVIDER_SITE_OTHER): Payer: Medicare Other | Admitting: Endocrinology

## 2017-07-16 ENCOUNTER — Encounter: Payer: Self-pay | Admitting: Endocrinology

## 2017-07-16 VITALS — BP 138/80 | HR 69 | Ht 72.0 in | Wt 279.8 lb

## 2017-07-16 DIAGNOSIS — E1165 Type 2 diabetes mellitus with hyperglycemia: Secondary | ICD-10-CM | POA: Diagnosis not present

## 2017-07-16 DIAGNOSIS — E063 Autoimmune thyroiditis: Secondary | ICD-10-CM | POA: Diagnosis not present

## 2017-07-16 DIAGNOSIS — Z794 Long term (current) use of insulin: Secondary | ICD-10-CM

## 2017-07-16 DIAGNOSIS — E291 Testicular hypofunction: Secondary | ICD-10-CM | POA: Diagnosis not present

## 2017-07-16 NOTE — Progress Notes (Signed)
Patient ID: Robert [name], male   DOB: 1961/04/22, 56 y.o.   MRN: [id number]   Reason for Appointment: Type II Diabetes follow-up   History of Present Illness   Diagnosis date:  2012  Previous history: His blood sugar has been difficult to control since onset. Initially was taking oral hypoglycemic drugs like glyburide but could not take metformin because of renal dysfunction. Since Victoza did not help his control he was started on Lantus and then U 500 insulin also His A1c has previously ranged from 7.8-9.8, the lowest level in 03/2013 He was on Victoza but because of his markedly decreased appetite and weight loss this was stopped in 9/15  Recent history:   Insulin regimen: Tresiba 16 units In a.m., NovoLog mealtime coverage 1:5 carbohydrate ratio  His A1c is usually lower than expected for his blood sugars and is 6.3, previously was 5.8  His fructosamine is relatively higher at 277 in 8/18, previously was at 250  Current blood sugar patterns, management and problems identified:  Even though he was was switched back to  U-500 insulin instead of NovoLog his blood sugars are not any better  His blood sugars are quite erratic  Also, discussion he reveals that he is usually not taking his Humulin before eating and usually lose weight and he checks his blood sugar and then takes the insulin  He is supposed to take a 1:5 coverage before meals not always doing so  If his blood sugars are significantly over 200 he would take as much as 20 units of the insulin  With this at least once he has had a low blood sugar when he took the amount at 2 AM  Fasting readings are quite variable and does have a couple of good readings with continuing his [name]  He says he is still having a lot of stress with various problems and fatigue  Still does not do any exercise  Has not checked sugars as much and less than before, previously doing at least 3 times a day  Proper timing of  medications in relation to meals: Yes.          Monitors blood glucose:  recently 2.1 times a day .    Glucometer: One [employer].          Blood Glucose readings by download  Mean values apply above for all meters except median for One Touch  PRE-MEAL Fasting Lunch Dinner Bedtime Overall  Glucose range:  56-1 86  87-342  104-271  164-339    Mean/median: 165  162  164  304 178+/-68    POST-MEAL PC Breakfast PC Lunch PC Dinner  Glucose range:   148-360   Mean/median:   200      Meals: 2-3 meals per day. 7-8 am and Noon and Dinner 6-7 PM .        Physical activity: exercise: not walking        Weight control:  Wt Readings from Last 3 Encounters:  07/16/17 279 lb 12.8 oz (126.9 kg)  06/25/17 275 lb (124.7 kg)  06/25/17 274 lb (865.7 kg)        Complications:   nephropathy, neuropathy   Diabetes labs:   Lab Results  Component Value Date   HGBA1C 6.3 05/28/2017   HGBA1C 5.8 11/13/2016   HGBA1C 5.9 06/14/2016   Lab Results  Component Value Date   MICROALBUR 224.5 Repeated and verified X2. (H) 01/04/2014   LDLCALC 73 05/28/2017  CREATININE 1.80 (H) 05/28/2017    Lab Results  Component Value Date   FRUCTOSAMINE 277 05/28/2017   FRUCTOSAMINE 250 04/17/2017   FRUCTOSAMINE 317 (H) 08/20/2016     No visits with results within 1 Week(s) from this visit.  Latest known visit with results is:  Lab on 05/28/2017  Component Date Value Ref Range Status  . Sodium 05/28/2017 138  135 - 145 mEq/L Final  . Potassium 05/28/2017 4.8  3.5 - 5.1 mEq/L Final  . Chloride 05/28/2017 110  96 - 112 mEq/L Final  . CO2 05/28/2017 24  19 - 32 mEq/L Final  . Glucose, Bld 05/28/2017 159* 70 - 99 mg/dL Final  . BUN 05/28/2017 27* 6 - 23 mg/dL Final  . Creatinine, Ser 05/28/2017 1.80* 0.40 - 1.50 mg/dL Final  . Calcium 05/28/2017 10.1  8.4 - 10.5 mg/dL Final  . GFR 05/28/2017 41.39* >60.00 mL/min Final  . Fructosamine 05/28/2017 277  0 - 285 umol/L Final   Comment: Published reference  interval for apparently healthy subjects between age 31 and 54 is 57 - 285 umol/L and in a poorly controlled diabetic population is 228 - 563 umol/L with a mean of 396 umol/L.   Marland Kitchen Hgb A1c MFr Bld 05/28/2017 6.3  4.6 - 6.5 % Final   Glycemic Control Guidelines for People with Diabetes:Non Diabetic:  <6%Goal of Therapy: <7%Additional Action Suggested:  >8%   . TSH 05/28/2017 1.30  0.35 - 4.50 uIU/mL Final  . Free T4 05/28/2017 0.87  0.60 - 1.60 ng/dL Final   Comment: Specimens from patients who are undergoing biotin therapy and /or ingesting biotin supplements may contain high levels of biotin.  The higher biotin concentration in these specimens interferes with this Free T4 assay.  Specimens that contain high levels  of biotin may cause false high results for this Free T4 assay.  Please interpret results in light of the total clinical presentation of the patient.    . Cholesterol 05/28/2017 137  0 - 200 mg/dL Final   ATP III Classification       Desirable:  < 200 mg/dL               Borderline High:  200 - 239 mg/dL          High:  > = 240 mg/dL  . Triglycerides 05/28/2017 71.0  0.0 - 149.0 mg/dL Final   Normal:  <150 mg/dLBorderline High:  150 - 199 mg/dL  . HDL 05/28/2017 49.60  >39.00 mg/dL Final  . VLDL 05/28/2017 14.2  0.0 - 40.0 mg/dL Final  . LDL Cholesterol 05/28/2017 73  0 - 99 mg/dL Final  . Total CHOL/HDL Ratio 05/28/2017 3   Final                  Men          Women1/2 Average Risk     3.4          3.3Average Risk          5.0          4.42X Average Risk          9.6          7.13X Average Risk          15.0          11.0                      . NonHDL 05/28/2017 87.57  Final   NOTE:  Non-HDL goal should be 30 mg/dL higher than patient's LDL goal (i.e. LDL goal of < 70 mg/dL, would have non-HDL goal of < 100 mg/dL)    OTHER problems: See review of systems    Allergies as of 07/16/2017   No Known Allergies     Medication List       Accurate as of 07/16/17  3:41 PM. Always  use your most recent med list.          amLODipine 5 MG tablet Commonly known as:  NORVASC Take 10 mg by mouth daily.   aspirin EC 81 MG tablet Take 81 mg by mouth every morning.   DULoxetine 30 MG capsule Commonly known as:  CYMBALTA Take 30 mg by mouth daily.   ENVARSUS XR 1 MG Tb24 Generic drug:  Tacrolimus Take 1 tablet by mouth daily.   fluocinonide cream 0.05 % Commonly known as:  LIDEX Apply 1 application topically daily as needed (Skin inflammation (hand, thigh, back)).   furosemide 20 MG tablet Commonly known as:  LASIX Take 40 mg by mouth daily.   gabapentin 100 MG capsule Commonly known as:  NEURONTIN Take 200 mg by mouth 2 (two) times daily. 200 IN THE AM AND 300 IN THE PM   glucose blood test strip Commonly known as:  ONETOUCH VERIO Use to check blood sugars up to 6 times daily   HUMULIN R 500 UNIT/ML injection Generic drug:  insulin regular human CONCENTRATED Using a U-100 1cc/31G/5/16" INSULIN SYRINGE, draw up and inject 5-18 units into the skin three times a day 30 minutes prior to each meal   Insulin Pen Needle 31G X 5 MM Misc Commonly known as:  B-D UF III MINI PEN NEEDLES USE 4 PER DAY TO INJECT INSULIN.   Insulin Syringe-Needle U-100 31G X 5/16" 0.3 ML Misc Commonly known as:  BD INSULIN SYRINGE ULTRAFINE Use to inject insulin 2-3 times per day   levothyroxine 100 MCG tablet Commonly known as:  SYNTHROID, LEVOTHROID Take 1 tablet (100 mcg total) by mouth daily.   metoprolol succinate 25 MG 24 hr tablet Commonly known as:  TOPROL-XL Take 50 mg by mouth 2 (two) times daily.   mycophenolate 250 MG capsule Commonly known as:  CELLCEPT Take 1,000 mg by mouth 2 (two) times daily.   ONETOUCH DELICA LANCETS FINE Misc Use to check blood sugars 4-6 times daily   polyethylene glycol packet Commonly known as:  MIRALAX / GLYCOLAX Take 17 g by mouth daily as needed.   predniSONE 5 MG tablet Commonly known as:  DELTASONE Take 5 mg by mouth  daily with breakfast.   rosuvastatin 10 MG tablet Commonly known as:  CRESTOR Take 1 tablet (10 mg total) by mouth daily.   senna-docusate 8.6-50 MG tablet Commonly known as:  Senokot-S Take 1 tablet by mouth as needed for mild constipation.   sulfamethoxazole-trimethoprim 800-160 MG tablet Commonly known as:  BACTRIM DS,SEPTRA DS Take 1 tablet by mouth 3 (three) times a week. Monday Wednesday and Friday   tamsulosin 0.4 MG Caps capsule Commonly known as:  FLOMAX Take 0.4 mg by mouth 2 (two) times daily.   traMADol 50 MG tablet Commonly known as:  ULTRAM Take 1 tablet (50 mg total) by mouth every 6 (six) hours as needed.   traZODone 50 MG tablet Commonly known as:  DESYREL Take 50-100 mg by mouth at bedtime as needed for sleep.   TRESIBA FLEXTOUCH 100 UNIT/ML Sopn FlexTouch Pen Generic  drug:  insulin degludec INJECT 30 UNITS INTO THE SKIN EVERY MORNING   valGANciclovir 450 MG tablet Commonly known as:  VALCYTE Take 900 mg by mouth daily.   zolpidem 10 MG tablet Commonly known as:  AMBIEN Take 10-20 mg by mouth at bedtime as needed for sleep.       Allergies: No Known Allergies  Past Medical History:  Diagnosis Date  . Anemia    low hgb at present  . Arthritis    FEET  . Blood transfusion without reported diagnosis 1974   kidney removed - L  . Cancer ([place])    skin ca right shoulder, plastic dsyplasia, pre-Ca polpys removed on Colonoscopy- 07/2014  . Colon polyps 07/23/2014   Tubular adenomas x 5  . Constipation   . Diabetes mellitus without complication (HCC)    Type 2  . Difficult intubation    unsure of actual problem but it was during the January 28, 2015 procedure.  Marland Kitchen Dysrhythmia   . Eczema   . ESRD (end stage renal disease) ([place])    hEMO mwf  . Headache(784.0)    slight dull h/a due kidney failure  . Hyperlipidemia   . Hypertension    SVT h/o , followed by Dr. Claiborne Billings  . Hypothyroidism   . Macular degeneration   . Neuropathy    right hand and  both feet  . PAT (paroxysmal atrial tachycardia) ([place])   . PSVT (paroxysmal supraventricular tachycardia) ([place])   . SBO (small bowel obstruction) ([place])   . Shortness of breath    with exertion  . Sleep apnea 06/18/11   split-night sleep study- [facility]., BiPAP- 13-16  . Thyroid disease   . Umbilical hernia    will repair with thi surgery    Past Surgical History:  Procedure Laterality Date  . AV FISTULA PLACEMENT Right 02/25/2015   Procedure: RIGHT ARTERIOVENOUS (AV) FISTULA CREATION;  Surgeon: Serafina Mitchell, MD;  Location: [place];  Service: Vascular;  Laterality: Right;  . AV FISTULA PLACEMENT Right 09/15/2015   Procedure: ARTERIOVENOUS (AV) FISTULA CREATION- RIGHT ARM;  Surgeon: Serafina Mitchell, MD;  Location: [place];  Service: Vascular;  Laterality: Right;  . CAPD INSERTION N/A 05/18/2014   Procedure: LAPAROSCOPIC INSERTION CONTINUOUS AMBULATORY PERITONEAL DIALYSIS  (CAPD) CATHETER;  Surgeon: Ralene Ok, MD;  Location: [place];  Service: General;  Laterality: N/A;  . COLONOSCOPY    . COLONOSCOPY WITH PROPOFOL N/A 07/12/2016   Procedure: COLONOSCOPY WITH PROPOFOL;  Surgeon: [name], MD;  [facility]: [facility];  Service: Endoscopy;  Laterality: N/A;  . declotting of fistula  08/2015  . ESOPHAGOGASTRODUODENOSCOPY (EGD) WITH PROPOFOL N/A 07/12/2016   Procedure: ESOPHAGOGASTRODUODENOSCOPY (EGD) WITH PROPOFOL;  Surgeon: [name], MD;  [facility];  Service: Endoscopy;  Laterality: N/A;  . FISTULA SUPERFICIALIZATION Right 02/07/2016   Procedure: RIIGHT UPPER ARM FISTULA SUPERFICIALIZATION;  Surgeon: Elam Dutch, MD;  Location: [place];  Service: Vascular;  Laterality: Right;  . IJ catheter insertion    . INSERTION OF DIALYSIS CATHETER Right 02/25/2015   Procedure: INSERTION OF RIGHT INTERNAL JUGULAR DIALYSIS CATHETER;  Surgeon: Serafina Mitchell, MD;  Location: [place];  Service: Vascular;  Laterality: Right;  . LAPAROSCOPIC REPOSITIONING CAPD  CATHETER N/A 06/16/2014   Procedure: LAPAROSCOPIC REPOSITIONING CAPD CATHETER;  Surgeon: Ralene Ok, MD;  Location: [place];  Service: General;  Laterality: N/A;  . LAPAROSCOPY N/A 05/04/2015   Procedure: LAPAROSCOPY DIAGNOSTIC LYSIS OF ADHESIONS;  Surgeon: Nathaneil Canary  Ninfa Linden, MD;  Location: [place];  Service: General;  Laterality: N/A;  . MINOR REMOVAL OF PERITONEAL DIALYSIS CATHETER N/A 04/28/2015   Procedure:  REMOVAL OF PERITONEAL DIALYSIS CATHETER;  Surgeon: Coralie Keens, MD;  Location: [place];  Service: General;  Laterality: N/A;  . NEPHRECTOMY Left 1974  . RENAL BIOPSY Right 2012  . REVISON OF ARTERIOVENOUS FISTULA Right 05/26/2015   Procedure: SUPERFICIALIZATION OF ARTERIOVENOUS FISTULA WITH SIDE BRANCH LIGATIONS;  Surgeon: Serafina Mitchell, MD;  Location: [place];  Service: Vascular;  Laterality: Right;  . SKIN CANCER EXCISION     right shoulder  . SVT ABLATION N/A 11/23/2016   Procedure: SVT Ablation;  Surgeon: Will Meredith Leeds, MD;  Location: [facility];  Service: Cardiovascular;  Laterality: N/A;  . UMBILICAL HERNIA REPAIR N/A 05/18/2014   Procedure: HERNIA REPAIR UMBILICAL ADULT;  Surgeon: Ralene Ok, MD;  Location: [place];  Service: General;  Laterality: N/A;  . UMBILICAL HERNIA REPAIR N/A 04/28/2015   Procedure: UMBILICAL HERNIA REPAIR WITH MESH;  Surgeon: Coralie Keens, MD;  [facility];  Service: General;  Laterality: N/A;    Family History  Problem Relation Age of Onset  . Colon polyps Father   . Colon cancer Neg Hx   . Stomach cancer Neg Hx     Social History:  reports that he quit smoking about 4 years ago. His smoking use included Cigars. He has never used smokeless tobacco. He reports that he drinks alcohol. He reports that he does not use drugs.  Review of Systems: Following is a copy of the previous note:   Hypertension:    His blood pressure has been managed by his nephrologist and transplant team, he thinks his blood pressure has been much  higher at times at home   BP Readings from Last 3 Encounters:  07/16/17 138/80  06/25/17 110/80  06/25/17 114/66     Lipids:  His LDL is Well controlled with 10 mg Crestor Has no evidence of vascular disease    Lab Results  Component Value Date   CHOL 137 05/28/2017   CHOL 130 11/13/2016   CHOL 147 06/14/2016   Lab Results  Component Value Date   HDL 49.60 05/28/2017   HDL 41.40 11/13/2016   HDL 36.70 (L) 06/14/2016   Lab Results  Component Value Date   LDLCALC 73 05/28/2017   [place] 68 11/13/2016   LDLCALC 87 06/14/2016   Lab Results  Component Value Date   TRIG 71.0 05/28/2017   TRIG 102.0 11/13/2016   TRIG 119.0 06/14/2016   Lab Results  Component Value Date   CHOLHDL 3 05/28/2017   CHOLHDL 3 11/13/2016   CHOLHDL 4 06/14/2016   No results found for: LDLDIRECT   HYPOTHYROIDISM: He has had long-standing hypothyroidism  Has been taking his 100 g levothyroxine before breakfast consistently Recent TSH normal  Lab Results  Component Value Date   TSH 1.30 05/28/2017       HYPOGONADISM: 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 metabolic syndrome and had subjectively improved with supplementation using Fortesta  He has been off testosterone Since early 2016   Lab Results  Component Value Date   TESTOSTERONE 369 06/14/2016    Diabetic neuropathy: Has mild sensory loss on his last exam      Examination:   BP 138/80   Pulse 69   Ht 6' (1.829 m)   Wt 279 lb 12.8 oz (126.9 kg)   SpO2 98%   BMI 37.95  kg/m   Body mass index is 37.95 kg/m.    ASSESSMENT/ PLAN:    Diabetes type 2 with obesity:  See history of present illness for detailed discussion of his current management, problems identified and sugar patterns lately  As above His blood sugar control is erratic because of not taking mealtime insulin before eating  Recommendations: Emphasized the need to take the mealtime insulin 30 minutes before eating He can  at least try to use his smart phone to remind him about taking his insulin along with his medication He does need to try and check his sugar more often and may be eligible for the freestyle [place] fee dose 4 times a day Also discussed that even if he has not checked his blood sugars should take insulin to cover his meals Make taking insulin right before eating if he is not sure what he is going to eat No change in [name] as yet He should try and calculate 1:5 coverage for his meals but if sugars are still going up he can change the ratio Also recommended no more than 7-8 units for correction doses with blood sugars well over 200  Today discussed in detail the use of the insulin pumps For now he is not interested in pump with an infusion set He is more receptive to trying an Omnipod pump and this was shown to him and discussed how this would be used He will check into the insurance coverage and call if interested and if there is no difficulty getting the prescription through Follow-up in 2 months is not starting the pump  Patient Instructions  Must inject before the meal   Counseling time on subjects discussed in assessment and plan sections is over 50% of today's 25 minute visit    Kameshia Madruga 07/16/2017, 3:41 PM

## 2017-07-16 NOTE — Patient Instructions (Signed)
Must inject before the meal

## 2017-07-17 DIAGNOSIS — Z940 Kidney transplant status: Secondary | ICD-10-CM | POA: Diagnosis not present

## 2017-07-17 DIAGNOSIS — Z5181 Encounter for therapeutic drug level monitoring: Secondary | ICD-10-CM | POA: Diagnosis not present

## 2017-07-19 DIAGNOSIS — E1140 Type 2 diabetes mellitus with diabetic neuropathy, unspecified: Secondary | ICD-10-CM | POA: Diagnosis not present

## 2017-07-19 DIAGNOSIS — D899 Disorder involving the immune mechanism, unspecified: Secondary | ICD-10-CM | POA: Diagnosis not present

## 2017-07-19 DIAGNOSIS — I10 Essential (primary) hypertension: Secondary | ICD-10-CM | POA: Diagnosis not present

## 2017-07-19 DIAGNOSIS — E039 Hypothyroidism, unspecified: Secondary | ICD-10-CM | POA: Diagnosis not present

## 2017-07-19 DIAGNOSIS — Z940 Kidney transplant status: Secondary | ICD-10-CM | POA: Diagnosis not present

## 2017-07-19 DIAGNOSIS — E8770 Fluid overload, unspecified: Secondary | ICD-10-CM | POA: Diagnosis not present

## 2017-07-19 DIAGNOSIS — N433 Hydrocele, unspecified: Secondary | ICD-10-CM | POA: Diagnosis not present

## 2017-07-19 DIAGNOSIS — Z4822 Encounter for aftercare following kidney transplant: Secondary | ICD-10-CM | POA: Diagnosis not present

## 2017-07-19 DIAGNOSIS — G4733 Obstructive sleep apnea (adult) (pediatric): Secondary | ICD-10-CM | POA: Diagnosis not present

## 2017-07-23 ENCOUNTER — Ambulatory Visit (HOSPITAL_COMMUNITY)
Admission: EM | Admit: 2017-07-23 | Discharge: 2017-07-23 | Disposition: A | Discharge status: Home / Self Care | Payer: Medicare Other | Attending: Family Medicine | Admitting: Family Medicine

## 2017-07-23 ENCOUNTER — Encounter (HOSPITAL_COMMUNITY): Payer: Self-pay | Admitting: Emergency Medicine

## 2017-07-23 ENCOUNTER — Ambulatory Visit (INDEPENDENT_AMBULATORY_CARE_PROVIDER_SITE_OTHER): Payer: Medicare Other

## 2017-07-23 ENCOUNTER — Ambulatory Visit (INDEPENDENT_AMBULATORY_CARE_PROVIDER_SITE_OTHER): Payer: Medicare Other | Admitting: Orthotics

## 2017-07-23 DIAGNOSIS — M205X9 Other deformities of toe(s) (acquired), unspecified foot: Secondary | ICD-10-CM | POA: Diagnosis not present

## 2017-07-23 DIAGNOSIS — E1149 Type 2 diabetes mellitus with other diabetic neurological complication: Secondary | ICD-10-CM

## 2017-07-23 DIAGNOSIS — R0781 Pleurodynia: Secondary | ICD-10-CM

## 2017-07-23 DIAGNOSIS — B9789 Other viral agents as the cause of diseases classified elsewhere: Secondary | ICD-10-CM | POA: Diagnosis not present

## 2017-07-23 DIAGNOSIS — M79676 Pain in unspecified toe(s): Secondary | ICD-10-CM | POA: Diagnosis not present

## 2017-07-23 DIAGNOSIS — R05 Cough: Secondary | ICD-10-CM | POA: Diagnosis not present

## 2017-07-23 DIAGNOSIS — M2020 Hallux rigidus, unspecified foot: Secondary | ICD-10-CM

## 2017-07-23 DIAGNOSIS — J069 Acute upper respiratory infection, unspecified: Secondary | ICD-10-CM

## 2017-07-23 DIAGNOSIS — B351 Tinea unguium: Secondary | ICD-10-CM

## 2017-07-23 DIAGNOSIS — I10 Essential (primary) hypertension: Secondary | ICD-10-CM

## 2017-07-23 MED ORDER — OSELTAMIVIR PHOSPHATE 30 MG PO CAPS: 30.0000 mg | ORAL_CAPSULE | Freq: Every day | ORAL | 0 refills | Status: AC

## 2017-07-23 NOTE — Discharge Instructions (Signed)
Good news is that chest xray looks ok, without signs of pneumonia, no evidence of a bacterial infection by exam and no indication that a antibiotic would help you in this setting. Certainly if you worsen then f/u with your PCP or here. No rib fracture seen on the chest xray,. Treat with heat and rest. Continue your Tramadol for pain as needed. May use Mucinex DM or coricidin BP for cough. Please keep f/u with your PCP for blood pressure control. Feel better.

## 2017-07-23 NOTE — ED Provider Notes (Signed)
[facility]    CSN: [id number] Arrival date & time: 07/23/17  1615     History   Chief Complaint Chief Complaint  Patient presents with  . Fall  . URI    HPI TEJUAN GHOLSON is a 56 y.o. male.   Presents with a fall on Saturday while dragging limbs in his yard. His pain was immediate in the anterior ribs. He has no dyspnea only pain with ROM. He also notes a 3 day history of productive cough (green), and mild sinus drainage. No fever or chills. He states he has company in his home that are "sick" with respiratory illness. He has not been taking anything. He would like a RX for Tamiflu to take in the setting of renal transplant.      Past Medical History:  Diagnosis Date  . Anemia    low hgb at present  . Arthritis    FEET  . Blood transfusion without reported diagnosis 1974   kidney removed - L  . Cancer ([place])    skin ca right shoulder, plastic dsyplasia, pre-Ca polpys removed on Colonoscopy- 07/2014  . Colon polyps 07/23/2014   Tubular adenomas x 5  . Constipation   . Diabetes mellitus without complication (HCC)    Type 2  . Difficult intubation    unsure of actual problem but it was during the January 28, 2015 procedure.  Marland Kitchen Dysrhythmia   . Eczema   . ESRD (end stage renal disease) ([place])    hEMO mwf  . Headache(784.0)    slight dull h/a due kidney failure  . Hyperlipidemia   . Hypertension    SVT h/o , followed by Dr. Claiborne Billings  . Hypothyroidism   . Macular degeneration   . Neuropathy    right hand and both feet  . PAT (paroxysmal atrial tachycardia) ([place])   . PSVT (paroxysmal supraventricular tachycardia) ([place])   . SBO (small bowel obstruction) ([place])   . Shortness of breath    with exertion  . Sleep apnea 06/18/11   split-night sleep study- [facility]., BiPAP- 13-16  . Thyroid disease   . Umbilical hernia    will repair with thi surgery    Patient Active Problem List   Diagnosis Date Noted  . Benign neoplasm of  transverse colon   . Diverticulosis of colon without hemorrhage   . Heme positive stool   . Morbid obesity ([place]) 02/03/2016  . 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, with long-term current use of insulin ([place]) 07/14/2015  . Nausea & vomiting   . HTN (hypertension) 05/11/2015  . Chronic renal failure   . SBO (small bowel obstruction) ([place]) 05/04/2015  . Protein-calorie malnutrition, severe ([place]) 06/15/2014  . Uremia of renal origin 06/12/2014  . End stage renal disease on dialysis ([place]) 03/22/2014  . Fatigue 01/08/2014  . H/O unilateral nephrectomy 09/06/2013  . Diabetes mellitus type 2 in obese ([place]) 09/06/2013  . SVT (supraventricular tachycardia) ([place]) 09/06/2013  . Lower extremity edema 09/06/2013  . Severe obstructive sleep apnea 09/06/2013  . Hypothyroidism 09/06/2013  . Hyperlipidemia with target LDL less than 70 09/06/2013    Past Surgical History:  Procedure Laterality Date  . AV FISTULA PLACEMENT Right 02/25/2015   Procedure: RIGHT ARTERIOVENOUS (AV) FISTULA CREATION;  Surgeon: Serafina Mitchell, MD;  Location: [facility];  Service: Vascular;  Laterality: Right;  . AV FISTULA PLACEMENT Right 09/15/2015   Procedure: ARTERIOVENOUS (AV) FISTULA CREATION- RIGHT  ARM;  Surgeon: Serafina Mitchell, MD;  Location: [facility];  Service: Vascular;  Laterality: Right;  . CAPD INSERTION N/A 05/18/2014   Procedure: LAPAROSCOPIC INSERTION CONTINUOUS AMBULATORY PERITONEAL DIALYSIS  (CAPD) CATHETER;  Surgeon: Ralene Ok, MD;  Location: [facility];  Service: General;  Laterality: N/A;  . COLONOSCOPY    . COLONOSCOPY WITH PROPOFOL N/A 07/12/2016   Procedure: COLONOSCOPY WITH PROPOFOL;  Surgeon: [name], MD;  [facility];  Service: Endoscopy;  Laterality: N/A;  . declotting of fistula  08/2015  . ESOPHAGOGASTRODUODENOSCOPY (EGD) WITH PROPOFOL N/A 07/12/2016   Procedure: ESOPHAGOGASTRODUODENOSCOPY (EGD) WITH PROPOFOL;  Surgeon: [name],  MD;  [facility];  Service: Endoscopy;  Laterality: N/A;  . FISTULA SUPERFICIALIZATION Right 02/07/2016   Procedure: RIIGHT UPPER ARM FISTULA SUPERFICIALIZATION;  Surgeon: Elam Dutch, MD;  Location: [facility];  Service: Vascular;  Laterality: Right;  . IJ catheter insertion    . INSERTION OF DIALYSIS CATHETER Right 02/25/2015   Procedure: INSERTION OF RIGHT INTERNAL JUGULAR DIALYSIS CATHETER;  Surgeon: Serafina Mitchell, MD;  Location: [facility];  Service: Vascular;  Laterality: Right;  . LAPAROSCOPIC REPOSITIONING CAPD CATHETER N/A 06/16/2014   Procedure: LAPAROSCOPIC REPOSITIONING CAPD CATHETER;  Surgeon: Ralene Ok, MD;  Location: [facility];  Service: General;  Laterality: N/A;  . LAPAROSCOPY N/A 05/04/2015   Procedure: LAPAROSCOPY DIAGNOSTIC LYSIS OF ADHESIONS;  Surgeon: Coralie Keens, MD;  Location: [facility];  Service: General;  Laterality: N/A;  . MINOR REMOVAL OF PERITONEAL DIALYSIS CATHETER N/A 04/28/2015   Procedure:  REMOVAL OF PERITONEAL DIALYSIS CATHETER;  Surgeon: Coralie Keens, MD;  Location: [facility];  Service: General;  Laterality: N/A;  . NEPHRECTOMY Left 1974  . RENAL BIOPSY Right 2012  . REVISON OF ARTERIOVENOUS FISTULA Right 05/26/2015   Procedure: SUPERFICIALIZATION OF ARTERIOVENOUS FISTULA WITH SIDE BRANCH LIGATIONS;  Surgeon: Serafina Mitchell, MD;  Location: [facility];  Service: Vascular;  Laterality: Right;  . SKIN CANCER EXCISION     right shoulder  . SVT ABLATION N/A 11/23/2016   Procedure: SVT Ablation;  Surgeon: Will Meredith Leeds, MD;  Location: [facility];  Service: Cardiovascular;  Laterality: N/A;  . UMBILICAL HERNIA REPAIR N/A 05/18/2014   Procedure: HERNIA REPAIR UMBILICAL ADULT;  Surgeon: Ralene Ok, MD;  Location: [facility];  Service: General;  Laterality: N/A;  . UMBILICAL HERNIA REPAIR N/A 04/28/2015   Procedure: UMBILICAL HERNIA REPAIR WITH MESH;  Surgeon: Coralie Keens, MD;  Location: [facility];  Service: General;  Laterality: N/A;       Home  Medications    Prior to Admission medications   Medication Sig Start Date End Date Taking? Authorizing Provider  amLODipine (NORVASC) 5 MG tablet Take 10 mg by mouth daily.    [provider]  aspirin EC 81 MG tablet Take 81 mg by mouth every morning.    [provider]  DULoxetine (CYMBALTA) 30 MG capsule Take 30 mg by mouth daily.  03/09/16   [provider]  fluocinonide cream (LIDEX) 0.81 % Apply 1 application topically daily as needed (Skin inflammation (hand, thigh, back)).     [provider]  furosemide (LASIX) 20 MG tablet Take 40 mg by mouth daily.     [provider]  gabapentin (NEURONTIN) 100 MG capsule Take 200 mg by mouth 2 (two) times daily. 200 IN THE AM AND 300 IN THE PM 06/02/16   [provider]  glucose blood (ONETOUCH VERIO) test strip Use to check blood sugars  up to 6 times daily 04/23/17   Elayne Snare, MD  HUMULIN R 500 UNIT/ML injection Using a U-100 1cc/31G/5/16" INSULIN SYRINGE, draw up and inject 5-18 units into the skin three times a day 30 minutes prior to each meal 05/30/17   Elayne Snare, MD  Insulin Pen Needle (B-D UF III MINI PEN NEEDLES) 31G X 5 MM MISC USE 4 PER DAY TO INJECT INSULIN. 04/23/17   Elayne Snare, MD  Insulin Syringe-Needle U-100 (BD INSULIN SYRINGE ULTRAFINE) 31G X 5/16" 0.3 ML MISC Use to inject insulin 2-3 times per day 04/23/17   Elayne Snare, MD  levothyroxine (SYNTHROID, LEVOTHROID) 100 MCG tablet Take 1 tablet (100 mcg total) by mouth daily. 04/23/17   Elayne Snare, MD  metoprolol succinate (TOPROL-XL) 25 MG 24 hr tablet Take 50 mg by mouth 2 (two) times daily.    [provider]  mycophenolate (CELLCEPT) 250 MG capsule Take 1,000 mg by mouth 2 (two) times daily.     [provider]  [facility] DELICA LANCETS FINE MISC Use to check blood sugars 4-6 times daily 04/23/17   Elayne Snare, MD  polyethylene glycol [facility] / GLYCOLAX) packet Take 17 g by mouth daily as needed.     [provider]  predniSONE (DELTASONE) 5 MG tablet Take 5 mg by mouth daily with breakfast.    [provider]  rosuvastatin (CRESTOR) 10 MG tablet Take 1 tablet (10 mg total) by mouth daily. 04/23/17   Elayne Snare, MD  senna-docusate (SENOKOT-S) 8.6-50 MG tablet Take 1 tablet by mouth as needed for mild constipation.    [provider]  sulfamethoxazole-trimethoprim (BACTRIM DS,SEPTRA DS) 800-160 MG tablet Take 1 tablet by mouth 3 (three) times a week. Monday Wednesday and Friday    [provider]  Tacrolimus (ENVARSUS XR) 1 MG TB24 Take 1 tablet by mouth daily.  07/09/17   [provider]  tamsulosin (FLOMAX) 0.4 MG CAPS capsule Take 0.4 mg by mouth 2 (two) times daily.    [provider]  traMADol (ULTRAM) 50 MG tablet Take 1 tablet (50 mg total) by mouth every 6 (six) hours as needed. Patient taking differently: Take 50-100 mg by mouth every 6 (six) hours as needed (for pain).  02/07/16   Ulyses Amor, PA-C  traZODone (DESYREL) 50 MG tablet Take 50-100 mg by mouth at bedtime as needed for sleep.  08/18/16   [provider]  TRESIBA FLEXTOUCH 100 UNIT/ML SOPN FlexTouch Pen INJECT 30 UNITS INTO THE SKIN EVERY MORNING Patient taking differently: INJECT 16 UNITS INTO THE SKIN EVERY MORNING 11/15/16   Elayne Snare, MD  valGANciclovir (VALCYTE) 450 MG tablet Take 900 mg by mouth daily.     [provider]  zolpidem (AMBIEN) 10 MG tablet Take 10-20 mg by mouth at bedtime as needed for sleep.     [provider]    Family History Family History  Problem Relation Age of Onset  . Colon polyps Father   . Colon cancer Neg Hx   . Stomach cancer Neg Hx     Social History Social History  Substance Use Topics  . Smoking status: Former Smoker    Types: Cigars    Quit date: 12/06/2012  . Smokeless tobacco: Never Used  . Alcohol use Yes     Comment: once/month     Allergies   Patient has no known allergies.   Review of  Systems Review of Systems  Constitutional: Negative for appetite change and fever.  HENT: Positive for congestion.   Respiratory: Positive for cough. Negative for chest tightness, shortness of breath and wheezing.   Cardiovascular: Negative for chest pain.  Musculoskeletal: Negative.  Negative for myalgias.  Skin: Negative for rash.  Neurological: Negative.      Physical Exam Triage Vital Signs ED Triage Vitals [07/23/17 1628]  Enc Vitals Group     BP (!) 177/93     Pulse Rate 78     Resp 18     Temp 98.9 F (37.2 C)     Temp Source Oral     SpO2 96 %     Weight      Height      Head Circumference      Peak Flow      Pain Score 2     Pain Loc      Pain Edu?      Excl. in [place]?    No data found.   Updated Vital Signs BP (!) 177/93 (BP Location: Right Arm)   Pulse 78   Temp 98.9 F (37.2 C) (Oral)   Resp 18   SpO2 96%   Visual Acuity Right Eye Distance:   Left Eye Distance:   Bilateral Distance:    Right Eye Near:   Left Eye Near:    Bilateral Near:     Physical Exam  Constitutional: He is oriented to person, place, and time. He appears well-developed and well-nourished. No distress.  HENT:  Head: Normocephalic and atraumatic.  Right Ear: External ear normal.  Left Ear: External ear normal.  Mouth/Throat: Oropharynx is clear and moist.  Neck: Normal range of motion.  Cardiovascular: Normal rate and regular rhythm.   Pulmonary/Chest: Effort normal and breath sounds normal.  Tenderness along right anterior above diaphragm ribs without evidence of trauma  Musculoskeletal: He exhibits tenderness. He exhibits no edema or deformity.  Lymphadenopathy:    He has no cervical adenopathy.  Neurological: He is alert and oriented to person, place, and time.  Skin: Skin is warm. He is not diaphoretic.  Psychiatric: His behavior is normal.  Nursing note and vitals reviewed.    UC Treatments / Results  Labs (all labs ordered are listed, but only abnormal results  are displayed) Labs Reviewed - No data to display  EKG  EKG Interpretation None       Radiology Dg Chest 2 View  Result Date: 07/23/2017 CLINICAL DATA:  [place] on RIGHT side 4 days ago, upper rib pain, cough, history diabetes mellitus, hypertension, end-stage renal disease EXAM: CHEST  2 VIEW COMPARISON:  09/14/2016 FINDINGS: Normal heart size, mediastinal contours, and pulmonary vascularity. Vascular stent at RIGHT subclavian vessels. Bibasilar atelectasis. Minimal central peribronchial thickening. No acute infiltrate, pleural effusion or pneumothorax. No definite acute osseous findings. IMPRESSION: Bronchitic changes with bibasilar atelectasis. Electronically Signed   By: Lavonia Dana M.D.   On: 07/23/2017 16:55    Procedures Procedures (including critical care time)  Medications Ordered in UC Medications - No data to display   Initial Impression / Assessment and Plan / UC Course  I have reviewed the triage vital signs and the nursing notes.  Pertinent labs & imaging results that were available during my care of the patient were reviewed by me and considered in my medical decision making (see chart for details).   Viral URI treat symptomatically. He also requested tamiflu prophlaticly due to illness in his home in the setting of immunocompromised state. Disp changed to #5. RX given. FU with PCP  for HTN and here as needed.     Final Clinical Impressions(s) / UC Diagnoses   Final diagnoses:  Viral URI with cough  Rib pain on right side    New Prescriptions New Prescriptions   No medications on file     Controlled Substance Prescriptions [place] Controlled Substance Registry consulted? Not Applicable   Bjorn Pippin, PA-C 07/23/17 1721

## 2017-07-23 NOTE — ED Triage Notes (Signed)
Pt here for fall while dragging limbs on Saturday with pain in right rib; pt here with URI sx as well; pt sts productive cough with green sputum; pt had kidney transplant 5/18

## 2017-07-26 ENCOUNTER — Encounter (HOSPITAL_COMMUNITY): Payer: Self-pay | Admitting: Emergency Medicine

## 2017-07-26 ENCOUNTER — Inpatient Hospital Stay (HOSPITAL_COMMUNITY)
Admission: EM | Admit: 2017-07-26 | Discharge: 2017-07-28 | DRG: 377 | Disposition: A | Discharge status: Home / Self Care | Payer: Medicare Other | Attending: Internal Medicine | Admitting: Internal Medicine

## 2017-07-26 ENCOUNTER — Emergency Department (HOSPITAL_COMMUNITY): Payer: Medicare Other

## 2017-07-26 DIAGNOSIS — N390 Urinary tract infection, site not specified: Secondary | ICD-10-CM | POA: Diagnosis present

## 2017-07-26 DIAGNOSIS — E669 Obesity, unspecified: Secondary | ICD-10-CM

## 2017-07-26 DIAGNOSIS — K5731 Diverticulosis of large intestine without perforation or abscess with bleeding: Principal | ICD-10-CM | POA: Diagnosis present

## 2017-07-26 DIAGNOSIS — D649 Anemia, unspecified: Secondary | ICD-10-CM | POA: Diagnosis present

## 2017-07-26 DIAGNOSIS — K922 Gastrointestinal hemorrhage, unspecified: Secondary | ICD-10-CM | POA: Diagnosis not present

## 2017-07-26 DIAGNOSIS — N186 End stage renal disease: Secondary | ICD-10-CM | POA: Diagnosis present

## 2017-07-26 DIAGNOSIS — Z992 Dependence on renal dialysis: Secondary | ICD-10-CM

## 2017-07-26 DIAGNOSIS — K5730 Diverticulosis of large intestine without perforation or abscess without bleeding: Secondary | ICD-10-CM | POA: Diagnosis not present

## 2017-07-26 DIAGNOSIS — Z87891 Personal history of nicotine dependence: Secondary | ICD-10-CM

## 2017-07-26 DIAGNOSIS — E1169 Type 2 diabetes mellitus with other specified complication: Secondary | ICD-10-CM

## 2017-07-26 DIAGNOSIS — I10 Essential (primary) hypertension: Secondary | ICD-10-CM | POA: Diagnosis present

## 2017-07-26 DIAGNOSIS — K625 Hemorrhage of anus and rectum: Secondary | ICD-10-CM | POA: Diagnosis not present

## 2017-07-26 DIAGNOSIS — Z79899 Other long term (current) drug therapy: Secondary | ICD-10-CM | POA: Diagnosis not present

## 2017-07-26 DIAGNOSIS — Z905 Acquired absence of kidney: Secondary | ICD-10-CM | POA: Diagnosis not present

## 2017-07-26 DIAGNOSIS — E785 Hyperlipidemia, unspecified: Secondary | ICD-10-CM | POA: Diagnosis present

## 2017-07-26 DIAGNOSIS — G4730 Sleep apnea, unspecified: Secondary | ICD-10-CM | POA: Diagnosis present

## 2017-07-26 DIAGNOSIS — E039 Hypothyroidism, unspecified: Secondary | ICD-10-CM | POA: Diagnosis present

## 2017-07-26 DIAGNOSIS — J069 Acute upper respiratory infection, unspecified: Secondary | ICD-10-CM | POA: Diagnosis present

## 2017-07-26 DIAGNOSIS — E1142 Type 2 diabetes mellitus with diabetic polyneuropathy: Secondary | ICD-10-CM | POA: Diagnosis present

## 2017-07-26 DIAGNOSIS — E1165 Type 2 diabetes mellitus with hyperglycemia: Secondary | ICD-10-CM | POA: Diagnosis not present

## 2017-07-26 DIAGNOSIS — H3530 Unspecified macular degeneration: Secondary | ICD-10-CM | POA: Diagnosis present

## 2017-07-26 DIAGNOSIS — Z7982 Long term (current) use of aspirin: Secondary | ICD-10-CM

## 2017-07-26 DIAGNOSIS — I120 Hypertensive chronic kidney disease with stage 5 chronic kidney disease or end stage renal disease: Secondary | ICD-10-CM | POA: Diagnosis present

## 2017-07-26 DIAGNOSIS — E1122 Type 2 diabetes mellitus with diabetic chronic kidney disease: Secondary | ICD-10-CM | POA: Diagnosis present

## 2017-07-26 DIAGNOSIS — Z794 Long term (current) use of insulin: Secondary | ICD-10-CM | POA: Diagnosis not present

## 2017-07-26 DIAGNOSIS — Z940 Kidney transplant status: Secondary | ICD-10-CM

## 2017-07-26 DIAGNOSIS — K921 Melena: Secondary | ICD-10-CM | POA: Diagnosis not present

## 2017-07-26 HISTORY — DX: Gastrointestinal hemorrhage, unspecified: K92.2

## 2017-07-26 LAB — TYPE AND SCREEN
ABO/RH(D): A POS
ANTIBODY SCREEN: NEGATIVE

## 2017-07-26 LAB — COMPREHENSIVE METABOLIC PANEL
ALK PHOS: 122 U/L (ref 38–126)
ALT: 18 U/L (ref 17–63)
ANION GAP: 7 (ref 5–15)
AST: 14 U/L — ABNORMAL LOW (ref 15–41)
Albumin: 4.2 g/dL (ref 3.5–5.0)
BILIRUBIN TOTAL: 0.6 mg/dL (ref 0.3–1.2)
BUN: 26 mg/dL — ABNORMAL HIGH (ref 6–20)
CALCIUM: 9.8 mg/dL (ref 8.9–10.3)
CO2: 21 mmol/L — ABNORMAL LOW (ref 22–32)
CREATININE: 1.67 mg/dL — AB (ref 0.61–1.24)
Chloride: 108 mmol/L (ref 101–111)
GFR, EST AFRICAN AMERICAN: 51 mL/min — AB (ref >60)
GFR, EST NON AFRICAN AMERICAN: 44 mL/min — AB (ref >60)
Glucose, Bld: 177 mg/dL — ABNORMAL HIGH (ref 65–99)
Potassium: 4.9 mmol/L (ref 3.5–5.1)
Sodium: 136 mmol/L (ref 135–145)
Total Protein: 6.5 g/dL (ref 6.5–8.1)

## 2017-07-26 LAB — CBC
HCT: 40.3 % (ref 39.0–52.0)
HEMOGLOBIN: 12.8 g/dL — AB (ref 13.0–17.0)
MCH: 28.4 pg (ref 26.0–34.0)
MCHC: 31.8 g/dL (ref 30.0–36.0)
MCV: 89.6 fL (ref 78.0–100.0)
PLATELETS: 236 10*3/uL (ref 150–400)
RBC: 4.5 MIL/uL (ref 4.22–5.81)
RDW: 14 % (ref 11.5–15.5)
WBC: 5.2 10*3/uL (ref 4.0–10.5)

## 2017-07-26 LAB — CBG MONITORING, ED
GLUCOSE-CAPILLARY: 56 mg/dL — AB (ref 65–99)
Glucose-Capillary: 88 mg/dL (ref 65–99)

## 2017-07-26 LAB — POC OCCULT BLOOD, ED: FECAL OCCULT BLD: POSITIVE — AB

## 2017-07-26 MED ORDER — ONDANSETRON HCL 4 MG/2ML IJ SOLN: 4.0000 mg | Freq: Four times a day (QID) | INTRAMUSCULAR | Status: DC | PRN

## 2017-07-26 MED ORDER — LEVOTHYROXINE SODIUM 100 MCG PO TABS
100.0000 ug | ORAL_TABLET | Freq: Every day | ORAL | Status: DC
  Administered 2017-07-27 – 2017-07-28 (×2): 100 ug via ORAL
  Filled 2017-07-26 (×2): qty 1

## 2017-07-26 MED ORDER — PANTOPRAZOLE SODIUM 40 MG IV SOLR
40.0000 mg | Freq: Once | INTRAVENOUS | Status: AC
  Administered 2017-07-26: 40 mg via INTRAVENOUS
  Filled 2017-07-26: qty 40

## 2017-07-26 MED ORDER — SENNOSIDES-DOCUSATE SODIUM 8.6-50 MG PO TABS: 1.0000 | ORAL_TABLET | ORAL | Status: DC | PRN

## 2017-07-26 MED ORDER — VALGANCICLOVIR HCL 450 MG PO TABS
900.0000 mg | ORAL_TABLET | Freq: Every day | ORAL | Status: DC
  Administered 2017-07-27: 900 mg via ORAL
  Filled 2017-07-26 (×4): qty 2

## 2017-07-26 MED ORDER — RANITIDINE HCL 150 MG/10ML PO SYRP
150.0000 mg | ORAL_SOLUTION | Freq: Once | ORAL | Status: AC
  Administered 2017-07-26: 150 mg via ORAL
  Filled 2017-07-26: qty 10

## 2017-07-26 MED ORDER — AMLODIPINE BESYLATE 10 MG PO TABS
10.0000 mg | ORAL_TABLET | Freq: Every day | ORAL | Status: DC
  Administered 2017-07-27 – 2017-07-28 (×2): 10 mg via ORAL
  Filled 2017-07-26 (×2): qty 1

## 2017-07-26 MED ORDER — TAMSULOSIN HCL 0.4 MG PO CAPS: 0.8000 mg | ORAL_CAPSULE | Freq: Every day | ORAL | Status: DC

## 2017-07-26 MED ORDER — FAMOTIDINE 20 MG PO TABS: 20.0000 mg | ORAL_TABLET | Freq: Every day | ORAL | Status: DC

## 2017-07-26 MED ORDER — SODIUM CHLORIDE 0.9 % IV SOLN
8.0000 mg/h | INTRAVENOUS | Status: DC
  Administered 2017-07-27: 8 mg/h via INTRAVENOUS
  Filled 2017-07-26 (×2): qty 80

## 2017-07-26 MED ORDER — DEXTROSE 50 % IV SOLN
1.0000 | Freq: Once | INTRAVENOUS | Status: AC
  Administered 2017-07-26: 50 mL via INTRAVENOUS
  Filled 2017-07-26: qty 50

## 2017-07-26 MED ORDER — INSULIN ASPART 100 UNIT/ML ~~LOC~~ SOLN: 0.0000 [IU] | SUBCUTANEOUS | Status: DC

## 2017-07-26 MED ORDER — DULOXETINE HCL 30 MG PO CPEP
30.0000 mg | ORAL_CAPSULE | Freq: Every day | ORAL | Status: DC
  Administered 2017-07-27 – 2017-07-28 (×2): 30 mg via ORAL
  Filled 2017-07-26 (×2): qty 1

## 2017-07-26 MED ORDER — PREDNISONE 5 MG PO TABS
5.0000 mg | ORAL_TABLET | Freq: Every day | ORAL | Status: DC
  Filled 2017-07-26: qty 1

## 2017-07-26 MED ORDER — DM-GUAIFENESIN ER 30-600 MG PO TB12
1.0000 | ORAL_TABLET | Freq: Two times a day (BID) | ORAL | Status: DC
  Administered 2017-07-27 – 2017-07-28 (×5): 1 via ORAL
  Filled 2017-07-26 (×4): qty 1

## 2017-07-26 MED ORDER — TACROLIMUS 1 MG PO TB24
5.0000 mg | ORAL_TABLET | Freq: Every day | ORAL | Status: DC
  Administered 2017-07-27 – 2017-07-28 (×2): 5 mg via ORAL
  Filled 2017-07-26: qty 1

## 2017-07-26 MED ORDER — ACETAMINOPHEN 500 MG PO TABS
1000.0000 mg | ORAL_TABLET | Freq: Two times a day (BID) | ORAL | Status: DC | PRN
  Administered 2017-07-26: 1000 mg via ORAL
  Filled 2017-07-26: qty 2

## 2017-07-26 MED ORDER — POLYETHYLENE GLYCOL 3350 17 G PO PACK: 17.0000 g | PACK | Freq: Every day | ORAL | Status: DC | PRN

## 2017-07-26 MED ORDER — PANTOPRAZOLE SODIUM 40 MG IV SOLR
40.0000 mg | Freq: Once | INTRAVENOUS | Status: AC
  Administered 2017-07-27: 40 mg via INTRAVENOUS
  Filled 2017-07-26: qty 40

## 2017-07-26 MED ORDER — ACETAMINOPHEN 325 MG PO TABS: 650.0000 mg | ORAL_TABLET | Freq: Four times a day (QID) | ORAL | Status: DC | PRN

## 2017-07-26 MED ORDER — SODIUM CHLORIDE 0.9 % IV BOLUS (SEPSIS)
500.0000 mL | Freq: Once | INTRAVENOUS | Status: AC
  Administered 2017-07-26: 500 mL via INTRAVENOUS

## 2017-07-26 MED ORDER — GABAPENTIN 100 MG PO CAPS: 200.0000 mg | ORAL_CAPSULE | ORAL | Status: DC

## 2017-07-26 MED ORDER — ROSUVASTATIN CALCIUM 10 MG PO TABS
10.0000 mg | ORAL_TABLET | Freq: Every day | ORAL | Status: DC
  Administered 2017-07-27 – 2017-07-28 (×2): 10 mg via ORAL
  Filled 2017-07-26 (×2): qty 1

## 2017-07-26 MED ORDER — TACROLIMUS 1 MG PO TB24: 1.0000 mg | ORAL_TABLET | Freq: Every day | ORAL | Status: DC

## 2017-07-26 MED ORDER — OSELTAMIVIR PHOSPHATE 30 MG PO CAPS
30.0000 mg | ORAL_CAPSULE | Freq: Every day | ORAL | Status: AC
  Administered 2017-07-27 – 2017-07-28 (×2): 30 mg via ORAL
  Filled 2017-07-26 (×2): qty 1

## 2017-07-26 MED ORDER — TAMSULOSIN HCL 0.4 MG PO CAPS
0.8000 mg | ORAL_CAPSULE | Freq: Every day | ORAL | Status: DC
  Administered 2017-07-27: 0.8 mg via ORAL
  Filled 2017-07-26: qty 2

## 2017-07-26 MED ORDER — INSULIN GLARGINE 100 UNIT/ML ~~LOC~~ SOLN
16.0000 [IU] | Freq: Every day | SUBCUTANEOUS | Status: DC
  Administered 2017-07-27 – 2017-07-28 (×2): 16 [IU] via SUBCUTANEOUS
  Filled 2017-07-26 (×2): qty 0.16

## 2017-07-26 MED ORDER — PANTOPRAZOLE SODIUM 40 MG IV SOLR: 40.0000 mg | Freq: Two times a day (BID) | INTRAVENOUS | Status: DC

## 2017-07-26 MED ORDER — SULFAMETHOXAZOLE-TRIMETHOPRIM 800-160 MG PO TABS: 1.0000 | ORAL_TABLET | ORAL | Status: DC

## 2017-07-26 MED ORDER — METOPROLOL TARTRATE 25 MG PO TABS
25.0000 mg | ORAL_TABLET | Freq: Two times a day (BID) | ORAL | Status: DC
  Administered 2017-07-26: 25 mg via ORAL
  Filled 2017-07-26: qty 1

## 2017-07-26 MED ORDER — TRAMADOL HCL 50 MG PO TABS
50.0000 mg | ORAL_TABLET | Freq: Four times a day (QID) | ORAL | Status: DC | PRN
  Administered 2017-07-26 – 2017-07-28 (×2): 100 mg via ORAL
  Filled 2017-07-26 (×2): qty 2

## 2017-07-26 MED ORDER — ZOLPIDEM TARTRATE 5 MG PO TABS: 10.0000 mg | ORAL_TABLET | Freq: Every evening | ORAL | Status: DC | PRN

## 2017-07-26 MED ORDER — MYCOPHENOLATE MOFETIL 250 MG PO CAPS
1000.0000 mg | ORAL_CAPSULE | Freq: Two times a day (BID) | ORAL | Status: DC
  Administered 2017-07-27 – 2017-07-28 (×3): 1000 mg via ORAL
  Filled 2017-07-26 (×4): qty 4

## 2017-07-26 MED ORDER — ONDANSETRON HCL 4 MG PO TABS: 4.0000 mg | ORAL_TABLET | Freq: Four times a day (QID) | ORAL | Status: DC | PRN

## 2017-07-26 MED ORDER — INSULIN DEGLUDEC 100 UNIT/ML ~~LOC~~ SOPN: 10.0000 [IU] | PEN_INJECTOR | Freq: Every day | SUBCUTANEOUS | Status: DC

## 2017-07-26 MED ORDER — TAMSULOSIN HCL 0.4 MG PO CAPS
0.8000 mg | ORAL_CAPSULE | Freq: Once | ORAL | Status: AC
  Administered 2017-07-26: 0.8 mg via ORAL
  Filled 2017-07-26: qty 2

## 2017-07-26 MED ORDER — METOPROLOL TARTRATE 50 MG PO TABS
50.0000 mg | ORAL_TABLET | Freq: Two times a day (BID) | ORAL | Status: DC
  Administered 2017-07-27 – 2017-07-28 (×3): 50 mg via ORAL
  Filled 2017-07-26 (×3): qty 1

## 2017-07-26 MED ORDER — TRAZODONE HCL 50 MG PO TABS
50.0000 mg | ORAL_TABLET | Freq: Every evening | ORAL | Status: DC | PRN
  Administered 2017-07-27: 100 mg via ORAL
  Administered 2017-07-27: 50 mg via ORAL
  Filled 2017-07-26: qty 1
  Filled 2017-07-26: qty 2

## 2017-07-26 MED ORDER — ACETAMINOPHEN 650 MG RE SUPP: 650.0000 mg | Freq: Four times a day (QID) | RECTAL | Status: DC | PRN

## 2017-07-26 NOTE — ED Notes (Signed)
PA at bedside, patient upset medications were not ordered for 9pm meds. PA and this RN attempting to talk patient down at this time.

## 2017-07-26 NOTE — ED Notes (Signed)
Patient back from CT, requesting to have tylenol and 9pm meds.  PA aware.

## 2017-07-26 NOTE — ED Notes (Signed)
Pt stated that he was leaving and going to [place]. Pt didn't want to hear what I had to say.

## 2017-07-26 NOTE — ED Notes (Signed)
PT reports he had kidney transplant in May 2018. HE was seen at [facility] Tuesday for a fall over a limb, XR did not show any broken ribs, pt reports he also had a cold and was given tamiflu profalacticly r/t being transplant pt.

## 2017-07-26 NOTE — ED Provider Notes (Signed)
[facility] EMERGENCY DEPARTMENT Provider Note   CSN: [id number] Arrival date & time: 07/26/17  1050     History   Chief Complaint Chief Complaint  Patient presents with  . Rectal Bleeding    HPI Robert [name] is a 56 y.o. male with extensive pmh including ESRD status post left nephrectomy and right kidney transplant (May 2018 at 2) on multiple immunosuppressant medications presents to ED for evaluation of rectal bleeding starting this morning. Describes loose, painless, burgundy and tarry colored stools associated with intermittent, sharp right lower quadrant abdominal pain. Had black cherry flavored Jell-O 2 days ago. Denies fevers, chills, nosebleeds, nausea, recent vomiting. No history of hemorrhoids, diverticulitis, PUD, NSAID use, EtOH use. Takes a daily aspirin. Had a recent colonoscopy and was told he had multiple polyps which were removed and diverticulosis Reports mechanical fall 3 days ago, where he landed on his right lateral rib cage on a tree stump. He was evaluated by urgent care and had a normal chest x-ray without referred fractures.  Her last note from [facility] kidney transplant provider (Dr. Azzie Glatter (on 07/19/17 patient is on prednisone, CellCept, envarsus, bela and prophylaxis bactrim WMF and calcyte.  HPI  Past Medical History:  Diagnosis Date  . Anemia    low hgb at present  . Arthritis    FEET  . Blood transfusion without reported diagnosis 1974   kidney removed - L  . Cancer ([place])    skin ca right shoulder, plastic dsyplasia, pre-Ca polpys removed on Colonoscopy- 07/2014  . Colon polyps 07/23/2014   Tubular adenomas x 5  . Constipation   . Diabetes mellitus without complication (HCC)    Type 2  . Difficult intubation    unsure of actual problem but it was during the January 28, 2015 procedure.  Marland Kitchen Dysrhythmia   . Eczema   . ESRD (end stage renal disease) ([place])    hEMO mwf  . Headache(784.0)    slight dull h/a due kidney failure   . Hyperlipidemia   . Hypertension    SVT h/o , followed by Dr. Claiborne Billings  . Hypothyroidism   . Macular degeneration   . Neuropathy    right hand and both feet  . PAT (paroxysmal atrial tachycardia) ([place])   . PSVT (paroxysmal supraventricular tachycardia) ([place])   . SBO (small bowel obstruction) ([place])   . Shortness of breath    with exertion  . Sleep apnea 06/18/11   split-night sleep study- [facility]., BiPAP- 13-16  . Thyroid disease   . Umbilical hernia    will repair with thi surgery    Patient Active Problem List   Diagnosis Date Noted  . Benign neoplasm of transverse colon   . Diverticulosis of colon without hemorrhage   . Heme positive stool   . Morbid obesity ([place]) 02/03/2016  . 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, with long-term current use of insulin ([place]) 07/14/2015  . Nausea & vomiting   . HTN (hypertension) 05/11/2015  . Chronic renal failure   . SBO (small bowel obstruction) ([place]) 05/04/2015  . Protein-calorie malnutrition, severe ([place]) 06/15/2014  . Uremia of renal origin 06/12/2014  . End stage renal disease on dialysis ([place]) 03/22/2014  . Fatigue 01/08/2014  . H/O unilateral nephrectomy 09/06/2013  . Diabetes mellitus type 2 in obese ([place]) 09/06/2013  . SVT (supraventricular tachycardia) ([place]) 09/06/2013  . Lower extremity edema 09/06/2013  . Severe obstructive sleep apnea  09/06/2013  . Hypothyroidism 09/06/2013  . Hyperlipidemia with target LDL less than 70 09/06/2013    Past Surgical History:  Procedure Laterality Date  . AV FISTULA PLACEMENT Right 02/25/2015   Procedure: RIGHT ARTERIOVENOUS (AV) FISTULA CREATION;  Surgeon: Serafina Mitchell, MD;  Location: [facility];  Service: Vascular;  Laterality: Right;  . AV FISTULA PLACEMENT Right 09/15/2015   Procedure: ARTERIOVENOUS (AV) FISTULA CREATION- RIGHT ARM;  Surgeon: Serafina Mitchell, MD;  Location: [facility];  Service: Vascular;  Laterality:  Right;  . CAPD INSERTION N/A 05/18/2014   Procedure: LAPAROSCOPIC INSERTION CONTINUOUS AMBULATORY PERITONEAL DIALYSIS  (CAPD) CATHETER;  Surgeon: Ralene Ok, MD;  Location: [facility];  Service: General;  Laterality: N/A;  . COLONOSCOPY    . COLONOSCOPY WITH PROPOFOL N/A 07/12/2016   Procedure: COLONOSCOPY WITH PROPOFOL;  Surgeon: [name], MD;  Location: [facility];  Service: Endoscopy;  Laterality: N/A;  . declotting of fistula  08/2015  . ESOPHAGOGASTRODUODENOSCOPY (EGD) WITH PROPOFOL N/A 07/12/2016   Procedure: ESOPHAGOGASTRODUODENOSCOPY (EGD) WITH PROPOFOL;  Surgeon: [name], MD;  [facility]: [facility];  Service: Endoscopy;  Laterality: N/A;  . FISTULA SUPERFICIALIZATION Right 02/07/2016   Procedure: RIIGHT UPPER ARM FISTULA SUPERFICIALIZATION;  Surgeon: Elam Dutch, MD;  Location: [facility];  Service: Vascular;  Laterality: Right;  . IJ catheter insertion    . INSERTION OF DIALYSIS CATHETER Right 02/25/2015   Procedure: INSERTION OF RIGHT INTERNAL JUGULAR DIALYSIS CATHETER;  Surgeon: Serafina Mitchell, MD;  Location: [facility];  Service: Vascular;  Laterality: Right;  . KIDNEY TRANSPLANT    . LAPAROSCOPIC REPOSITIONING CAPD CATHETER N/A 06/16/2014   Procedure: LAPAROSCOPIC REPOSITIONING CAPD CATHETER;  Surgeon: Ralene Ok, MD;  Location: [facility];  Service: General;  Laterality: N/A;  . LAPAROSCOPY N/A 05/04/2015   Procedure: LAPAROSCOPY DIAGNOSTIC LYSIS OF ADHESIONS;  Surgeon: Coralie Keens, MD;  Location: [facility];  Service: General;  Laterality: N/A;  . MINOR REMOVAL OF PERITONEAL DIALYSIS CATHETER N/A 04/28/2015   Procedure:  REMOVAL OF PERITONEAL DIALYSIS CATHETER;  Surgeon: Coralie Keens, MD;  Location: [facility];  Service: General;  Laterality: N/A;  . NEPHRECTOMY Left 1974  . RENAL BIOPSY Right 2012  . REVISON OF ARTERIOVENOUS FISTULA Right 05/26/2015   Procedure: SUPERFICIALIZATION OF ARTERIOVENOUS FISTULA WITH SIDE BRANCH LIGATIONS;  Surgeon: Serafina Mitchell, MD;  Location:  [facility];  Service: Vascular;  Laterality: Right;  . SKIN CANCER EXCISION     right shoulder  . SVT ABLATION N/A 11/23/2016   Procedure: SVT Ablation;  Surgeon: Will Meredith Leeds, MD;  Location: [facility];  Service: Cardiovascular;  Laterality: N/A;  . UMBILICAL HERNIA REPAIR N/A 05/18/2014   Procedure: HERNIA REPAIR UMBILICAL ADULT;  Surgeon: Ralene Ok, MD;  Location: [facility];  Service: General;  Laterality: N/A;  . UMBILICAL HERNIA REPAIR N/A 04/28/2015   Procedure: UMBILICAL HERNIA REPAIR WITH MESH;  Surgeon: Coralie Keens, MD;  Location: [facility];  Service: General;  Laterality: N/A;       Home Medications    Prior to Admission medications   Medication Sig Start Date End Date Taking? Authorizing Provider  acetaminophen (TYLENOL) 500 MG tablet Take 1,000 mg by mouth every 12 (twelve) hours as needed for mild pain.   Yes [provider]  amLODipine (NORVASC) 5 MG tablet Take 10 mg by mouth daily.   Yes [provider]  aspirin EC 81 MG tablet Take 81 mg by mouth every morning.   Yes [provider]  belatacept (  NULOJIX) 250 MG SOLR injection Inject 24.5 mLs into the vein every 28 (twenty-eight) days.  08/30/17  Yes [provider]  dextromethorphan-guaiFENesin (MUCINEX DM) 30-600 MG 12hr tablet Take 1 tablet by mouth 2 (two) times daily.   Yes [provider]  DULoxetine (CYMBALTA) 30 MG capsule Take 30 mg by mouth daily.  03/09/16  Yes [provider]  fluocinonide cream (LIDEX) 9.35 % Apply 1 application topically daily as needed (Skin inflammation (hand, thigh, back)).    Yes [provider]  furosemide (LASIX) 20 MG tablet Take 40 mg by mouth daily.    Yes [provider]  gabapentin (NEURONTIN) 100 MG capsule Take 200-300 mg by mouth See admin instructions. 200 mg in the morning and 300 mg at bedtime 06/02/16  Yes [provider]  glucose blood (ONETOUCH VERIO) test strip Use to check blood sugars  up to 6 times daily 04/23/17  Yes Elayne Snare, MD  HUMULIN R 500 UNIT/ML injection Using a U-100 1cc/31G/5/16" INSULIN SYRINGE, draw up and inject 5-18 units into the skin three times a day 30 minutes prior to each meal 05/30/17  Yes Elayne Snare, MD  levothyroxine (SYNTHROID, LEVOTHROID) 100 MCG tablet Take 1 tablet (100 mcg total) by mouth daily. 04/23/17  Yes Elayne Snare, MD  metoprolol tartrate (LOPRESSOR) 25 MG tablet Take 50 mg by mouth 2 (two) times daily.  06/01/17  Yes [provider]  mycophenolate (CELLCEPT) 250 MG capsule Take 1,000 mg by mouth every 12 (twelve) hours.    Yes [provider]  oseltamivir (TAMIFLU) 30 MG capsule Take 1 capsule (30 mg total) by mouth daily. 07/23/17 07/28/17 Yes Young, Vanessa Lengby, PA-C  polyethylene glycol (MIRALAX / GLYCOLAX) packet Take 17 g by mouth daily as needed for mild constipation.    Yes [provider]  predniSONE (DELTASONE) 5 MG tablet Take 5 mg by mouth daily with breakfast.   Yes [provider]  ranitidine (ZANTAC) 150 MG tablet Take 150 mg by mouth at bedtime. 03/19/17  Yes [provider]  rosuvastatin (CRESTOR) 10 MG tablet Take 1 tablet (10 mg total) by mouth daily. 04/23/17  Yes Elayne Snare, MD  senna-docusate (SENOKOT-S) 8.6-50 MG tablet Take 1 tablet by mouth as needed for mild constipation.   Yes [provider]  sulfamethoxazole-trimethoprim (BACTRIM DS,SEPTRA DS) 800-160 MG tablet Take 1 tablet by mouth every Monday, Wednesday, and Friday.    Yes [provider]  Tacrolimus (ENVARSUS XR) 1 MG TB24 Take 1 mg by mouth daily.  07/09/17  Yes [provider]  tamsulosin (FLOMAX) 0.4 MG CAPS capsule Take 0.8 mg by mouth at bedtime.    Yes [provider]  traMADol (ULTRAM) 50 MG tablet Take 1 tablet (50 mg total) by mouth every 6 (six) hours as needed. Patient taking differently: Take 50-100 mg by mouth every 6 (six) hours as needed (for pain).  02/07/16  Yes Ulyses Amor, PA-C  traZODone (DESYREL) 50 MG tablet Take 50-100 mg by mouth at bedtime as needed for sleep.  08/18/16  Yes [provider]  TRESIBA FLEXTOUCH 100 UNIT/ML SOPN FlexTouch Pen INJECT 30 UNITS INTO THE SKIN EVERY MORNING Patient taking differently: INJECT 16 UNITS INTO THE SKIN EVERY MORNING 11/15/16  Yes Elayne Snare, MD  UNABLE TO FIND BiPAP: At bedtime   Yes [provider]  valGANciclovir (VALCYTE) 450 MG tablet Take 900 mg by mouth daily.    Yes [provider]  zolpidem (AMBIEN) 10 MG  tablet Take 10-20 mg by mouth at bedtime as needed for sleep.    Yes [provider]  Insulin Pen Needle (B-D UF III MINI PEN NEEDLES) 31G X 5 MM MISC USE 4 PER DAY TO INJECT INSULIN. 04/23/17   Elayne Snare, MD  Insulin Syringe-Needle U-100 (BD INSULIN SYRINGE ULTRAFINE) 31G X 5/16" 0.3 ML MISC Use to inject insulin 2-3 times per day 04/23/17   Elayne Snare, MD  [facility] DELICA LANCETS FINE MISC Use to check blood sugars 4-6 times daily 04/23/17   Elayne Snare, MD    Family History Family History  Problem Relation Age of Onset  . Colon polyps Father   . Colon cancer Neg Hx   . Stomach cancer Neg Hx     Social History Social History  Substance Use Topics  . Smoking status: Former Smoker    Types: Cigars    Quit date: 12/06/2012  . Smokeless tobacco: Never Used  . Alcohol use Yes     Comment: once/month     Allergies   Patient has no known allergies.   Review of Systems Review of Systems  Constitutional: Negative for chills and fever.  Respiratory: Negative for shortness of breath.   Cardiovascular: Negative for chest pain.  Gastrointestinal: Positive for abdominal pain and blood in stool. Negative for constipation, diarrhea, nausea, rectal pain and vomiting.  Genitourinary: Negative for difficulty urinating, dysuria and hematuria.  Musculoskeletal: Negative for back pain.  Allergic/Immunologic: Positive for immunocompromised state.  Neurological:  Negative for light-headedness.  Hematological: Does not bruise/bleed easily (aspirin only).     Physical Exam Updated Vital Signs BP 139/87   Pulse 76   Temp 98.7 F (37.1 C) (Oral)   Resp 18   Ht 5\' 11"  (1.803 m)   Wt 122.2 kg (269 lb 6.4 oz)   SpO2 98%   BMI 37.57 kg/m   Physical Exam  Constitutional: He is oriented to person, place, and time. He appears well-developed and well-nourished. No distress.  Nontoxic appearing.  HENT:  Head: Normocephalic and atraumatic.  Nose: Nose normal.  Mouth/Throat: No oropharyngeal exudate.  Moist mucous membranes  Eyes: Pupils are equal, round, and reactive to light. Conjunctivae and EOM are normal.  Neck: Normal range of motion. Neck supple.  Cardiovascular: Normal rate, regular rhythm, normal heart sounds and intact distal pulses.   No murmur heard. No lower extremity edema  Pulmonary/Chest: Effort normal and breath sounds normal. No respiratory distress. He has no wheezes. He has no rales.  Abdominal: Soft. Bowel sounds are normal. He exhibits no distension. There is tenderness.  Mild, right mid abdominal tenderness Negative Murphy's. Negative McBurney's. No suprapubic or CVA tenderness. 2 large surgical incisions in the abdomen. No rebound, guarding, rigidity  Musculoskeletal: Normal range of motion. He exhibits no deformity.  Lymphadenopathy:    He has no cervical adenopathy.  Neurological: He is alert and oriented to person, place, and time.  Skin: Skin is warm and dry. Capillary refill takes less than 2 seconds.  Psychiatric: He has a normal mood and affect. His behavior is normal. Judgment and thought content normal.  Nursing note and vitals reviewed.    ED Treatments / Results  Labs (all labs ordered are listed, but only abnormal results are displayed) Labs Reviewed  COMPREHENSIVE METABOLIC PANEL - Abnormal; Notable for the following:       Result Value   CO2 21 (*)    Glucose, Bld 177 (*)    BUN 26 (*)  Creatinine, Ser 1.67 (*)    AST 14 (*)    GFR calc non Af Amer 44 (*)    GFR calc Af Amer 51 (*)    All other components within normal limits  CBC - Abnormal; Notable for the following:    Hemoglobin 12.8 (*)    All other components within normal limits  POC OCCULT BLOOD, ED - Abnormal; Notable for the following:    Fecal Occult Bld POSITIVE (*)    All other components within normal limits  CBG MONITORING, ED - Abnormal; Notable for the following:    Glucose-Capillary 56 (*)    All other components within normal limits  CBG MONITORING, ED  TYPE AND SCREEN    EKG  EKG Interpretation None       Radiology Ct Abdomen Pelvis Wo Contrast  Result Date: 07/26/2017 CLINICAL DATA:  GI bleed history of colon polyp EXAM: CT ABDOMEN AND PELVIS WITHOUT CONTRAST TECHNIQUE: Multidetector CT imaging of the abdomen and pelvis was performed following the standard protocol without IV contrast. COMPARISON:  06/04/2017, 05/11/2015 FINDINGS: Lower chest: Lung bases demonstrate no acute consolidation. Mild atelectasis at the posterior lung bases. No significant pleural effusion Hepatobiliary: No focal liver abnormality is seen. No gallstones, gallbladder wall thickening, or biliary dilatation. Pancreas: Unremarkable. No pancreatic ductal dilatation or surrounding inflammatory changes. Spleen: Normal in size without focal abnormality. Adrenals/Urinary Tract: Adrenal glands are within normal limits. Atrophic right kidney. Stable round soft tissue density in the expected location of left kidney measuring 18 mm. Bladder unremarkable. Transplanted kidney in the right lower quadrant with minimal perinephric stranding. No hydronephrosis. Stomach/Bowel: The stomach is nonenlarged. No dilated small bowel. Small lipoma in the distal ileum, re- demonstrated. No colon wall thickening. Mild sigmoid colon diverticular disease without acute inflammation. Vascular/Lymphatic: Aortic atherosclerosis. No aneurysmal dilatation. No  significantly enlarged lymph nodes Reproductive: Prostate is unremarkable. Other: Oblong fluid density in extending into the right inguinal canal is re- demonstrated, this measures 16 cm cranial caudad by 3.6 cm transverse and emanate from the lower pole region of the right lower quadrant transplanted kidney. No free air or significant free fluid Musculoskeletal: Degenerative changes of the spine. No acute or suspicious bone lesion IMPRESSION: 1. No CT evidence for acute intra-abdominal or pelvic abnormality. 2. Atrophic native right kidney. No visible left native renal tissue with stable round soft tissue density at the expected location of the left kidney. 3. Transplanted right lower quadrant kidney with nonspecific perinephric fat stranding. Re- demonstrated large oblong fluid collection extending from the lower pole of right lower quadrant transplant kidney, into the right inguinal canal. 4. Mild sigmoid colon diverticular disease without acute inflammation. Electronically Signed   By: Donavan Foil M.D.   On: 07/26/2017 20:25    Procedures Procedures (including critical care time)  Medications Ordered in ED Medications  acetaminophen (TYLENOL) tablet 1,000 mg (1,000 mg Oral Given 07/26/17 2213)  gabapentin (NEURONTIN) capsule 200-300 mg (200 mg Oral Not Given 07/26/17 2224)  metoprolol tartrate (LOPRESSOR) tablet 25 mg (25 mg Oral Given 07/26/17 2213)  traMADol (ULTRAM) tablet 50-100 mg (100 mg Oral Given 07/26/17 2233)  mycophenolate (CELLCEPT) capsule 1,000 mg (1,000 mg Oral Not Given 07/26/17 2224)  famotidine (PEPCID) tablet 20 mg (20 mg Oral Refused 07/26/17 2236)  sodium chloride 0.9 % bolus 500 mL (0 mLs Intravenous Stopped 07/26/17 2150)  pantoprazole (PROTONIX) injection 40 mg (40 mg Intravenous Given 07/26/17 1722)  dextrose 50 % solution 50 mL (50 mLs Intravenous Given 07/26/17 1720)  ranitidine (ZANTAC) 150 MG/10ML syrup 150 mg (150 mg Oral Given 07/26/17 2233)  tamsulosin (FLOMAX)  capsule 0.8 mg (0.8 mg Oral Given 07/26/17 2233)     Initial Impression / Assessment and Plan / ED Course  I have reviewed the triage vital signs and the nursing notes.  Pertinent labs & imaging results that were available during my care of the patient were reviewed by me and considered in my medical decision making (see chart for details).  Clinical Course as of Jul 26 2300  Fri Jul 26, 2017  1648 Creatinine: (!) 1.67 [CG]  1648 BUN: (!) 26 [CG]  1648 GFR, Est Non African American: (!) 44 [CG]  1649 Hemoglobin: (!) 12.8 [CG]  1649 Fecal Occult Blood, POC: (!) POSITIVE [CG]  2053 IMPRESSION: 1. No CT evidence for acute intra-abdominal or pelvic abnormality. 2. Atrophic native right kidney. No visible left native renal tissue with stable round soft tissue density at the expected location of the left kidney. 3. Transplanted right lower quadrant kidney with nonspecific perinephric fat stranding. Re- demonstrated large oblong fluid collection extending from the lower pole of right lower quadrant transplant kidney, into the right inguinal canal. 4. Mild sigmoid colon diverticular disease without acute inflammation. CT Abdomen Pelvis Wo Contrast [CG]  2216 Pt expresses frustration with "entire ED visit" from when walking through the door. He wants his meds ASAP including cellcept, zantac, tylenol, gabapentin. Frustrated with delay in wait time, CT scan, med administration and getting a "spoon to cut my meal" without a knife. Discussed recommendation to admit for obs, he is refusing at this time and wants to talk to patient care administrator.  [CG]    Clinical Course User Index [CG] Kinnie Feil, PA-C   Lab work remarkable for positive Hemoccult. DRE with maroon melena. Hemoglobin stable. Creatinine/BUNs/GFR mostly at baseline. No leukocytosis or neutropenia. CT scan shows no evidence of acute intra-abdominal or pelvic abnormality. There is a stable large fluid collection from the lower  pole of the right transplant kidney extending into right inguinal canal, c/w h/o right inguinal hernia. No diverticulitis. Most recent colonoscopy 07/2016 showed polyp in transverse colon with was removed and diverticulosis in left colon. Given immunosuppression, will consult GI.   Spoke to Dr. Ardis Hughs who agrees with admission for observation. He can be consulted as needed once inpatient. Does not expect colonoscopy anytime soon since most recent normal last year.   Discussed plan to admit with pt. He expressed frustration with "entire" ED visit from waiting line until now. Requesting all medications be administered to him because he will speak to me about admission. I attempted to apologize but pt cut me off. Explained risks vs benefits of delay in admission but states he does not care if he "bleeds out and dies", wants his medications. I apologized regarding delay, home meds were ordered three hours ago. Will talk to charge RN and reassess.   Final Clinical Impressions(s) / ED Diagnoses   Pt agreeable to be admitted. Will consult hospitalist.  Final diagnoses:  Rectal bleeding    New Prescriptions New Prescriptions   No medications on file     Arlean Hopping 07/26/17 2301    Duffy Bruce, MD 07/28/17 [phone]

## 2017-07-26 NOTE — ED Notes (Signed)
ED Provider at bedside. 

## 2017-07-26 NOTE — ED Triage Notes (Signed)
Pt to ed for evaluation of bleeding from rectum. Noted this morning noted bright red blood with wiping. Pt sts having 3-4 bms the last few days, formed, burgandy and tarry colored. RLQ abdominal pain, mild but sharp when it hits. Subsides quickly. Pt is [facility] kidney transplant pt.

## 2017-07-26 NOTE — ED Notes (Signed)
Pt said [facility] told him he couldn't go and that he had to stay

## 2017-07-27 ENCOUNTER — Encounter (HOSPITAL_COMMUNITY): Payer: Self-pay | Admitting: *Deleted

## 2017-07-27 DIAGNOSIS — K922 Gastrointestinal hemorrhage, unspecified: Secondary | ICD-10-CM

## 2017-07-27 LAB — PROTIME-INR
INR: 1.05
Prothrombin Time: 13.6 seconds (ref 11.4–15.2)

## 2017-07-27 LAB — CBC
HCT: 38.2 % — ABNORMAL LOW (ref 39.0–52.0)
HCT: 36.3 % — ABNORMAL LOW (ref 39.0–52.0)
HEMOGLOBIN: 11.8 g/dL — AB (ref 13.0–17.0)
HEMOGLOBIN: 12.5 g/dL — AB (ref 13.0–17.0)
MCH: 29.4 pg (ref 26.0–34.0)
MCH: 29.1 pg (ref 26.0–34.0)
MCHC: 32.7 g/dL (ref 30.0–36.0)
MCHC: 32.5 g/dL (ref 30.0–36.0)
MCV: 89.9 fL (ref 78.0–100.0)
MCV: 89.4 fL (ref 78.0–100.0)
PLATELETS: 246 10*3/uL (ref 150–400)
Platelets: 206 10*3/uL (ref 150–400)
RBC: 4.06 MIL/uL — ABNORMAL LOW (ref 4.22–5.81)
RBC: 4.25 MIL/uL (ref 4.22–5.81)
RDW: 13.8 % (ref 11.5–15.5)
RDW: 13.7 % (ref 11.5–15.5)
WBC: 6.4 10*3/uL (ref 4.0–10.5)
WBC: 5.2 10*3/uL (ref 4.0–10.5)

## 2017-07-27 LAB — APTT: aPTT: 29 seconds (ref 24–36)

## 2017-07-27 LAB — BASIC METABOLIC PANEL
ANION GAP: 8 (ref 5–15)
BUN: 22 mg/dL — ABNORMAL HIGH (ref 6–20)
CO2: 20 mmol/L — ABNORMAL LOW (ref 22–32)
Calcium: 9.6 mg/dL (ref 8.9–10.3)
Chloride: 106 mmol/L (ref 101–111)
Creatinine, Ser: 1.73 mg/dL — ABNORMAL HIGH (ref 0.61–1.24)
GFR, EST AFRICAN AMERICAN: 48 mL/min — AB (ref >60)
GFR, EST NON AFRICAN AMERICAN: 42 mL/min — AB (ref >60)
Glucose, Bld: 114 mg/dL — ABNORMAL HIGH (ref 65–99)
POTASSIUM: 4 mmol/L (ref 3.5–5.1)
SODIUM: 134 mmol/L — AB (ref 135–145)

## 2017-07-27 LAB — GLUCOSE, CAPILLARY
GLUCOSE-CAPILLARY: 115 mg/dL — AB (ref 65–99)
GLUCOSE-CAPILLARY: 156 mg/dL — AB (ref 65–99)
GLUCOSE-CAPILLARY: 170 mg/dL — AB (ref 65–99)
Glucose-Capillary: 153 mg/dL — ABNORMAL HIGH (ref 65–99)

## 2017-07-27 LAB — HIV ANTIBODY (ROUTINE TESTING W REFLEX): HIV Screen 4th Generation wRfx: NONREACTIVE

## 2017-07-27 MED ORDER — GABAPENTIN 100 MG PO CAPS
200.0000 mg | ORAL_CAPSULE | Freq: Every day | ORAL | Status: DC
  Administered 2017-07-27 – 2017-07-28 (×2): 200 mg via ORAL
  Filled 2017-07-27 (×2): qty 2

## 2017-07-27 MED ORDER — INSULIN ASPART 100 UNIT/ML ~~LOC~~ SOLN: 0.0000 [IU] | Freq: Every day | SUBCUTANEOUS | Status: DC

## 2017-07-27 MED ORDER — FAMOTIDINE 20 MG PO TABS
40.0000 mg | ORAL_TABLET | Freq: Every day | ORAL | Status: DC
  Administered 2017-07-27 – 2017-07-28 (×2): 40 mg via ORAL
  Filled 2017-07-27 (×2): qty 2

## 2017-07-27 MED ORDER — INSULIN ASPART 100 UNIT/ML ~~LOC~~ SOLN: 5.0000 [IU] | Freq: Three times a day (TID) | SUBCUTANEOUS | Status: DC

## 2017-07-27 MED ORDER — GABAPENTIN 300 MG PO CAPS
300.0000 mg | ORAL_CAPSULE | Freq: Every day | ORAL | Status: DC
  Administered 2017-07-27: 300 mg via ORAL
  Filled 2017-07-27: qty 1

## 2017-07-27 MED ORDER — INSULIN ASPART 100 UNIT/ML ~~LOC~~ SOLN
0.0000 [IU] | Freq: Three times a day (TID) | SUBCUTANEOUS | Status: DC
  Administered 2017-07-27: 2 [IU] via SUBCUTANEOUS
  Administered 2017-07-28: 1 [IU] via SUBCUTANEOUS
  Administered 2017-07-28: 2 [IU] via SUBCUTANEOUS

## 2017-07-27 NOTE — H&P (Signed)
History and Physical    Robert [name] CZY:[id number] DOB: Sep 06, 1961 DOA: 07/26/2017  PCP: Haywood Pao, MD  Patient coming from: Home  I have personally briefly reviewed patient's old medical records in [facility]  Chief Complaint: Maroon colored stool  HPI: Robert [name] is a 56 y.o. male with medical history significant of DM2, kidney transplant, HTN.  Patient presents to the ED with 1 day history of Maroon colored stool.  Loose painless, burgundy and tarry colored stools associated with intermittent, sharp, RLQ pain.  No fevers, N/V.  No h/o PUD previously, no NSAIDS though he is on prednisone 5mg  daily for anti-rejection of kidney transplant.   ED Course: CT abd/pelvis is neg.  HGB 12.x.  FOBT is positive.   Review of Systems: As per HPI otherwise 10 point review of systems negative.   Past Medical History:  Diagnosis Date  . Anemia    low hgb at present  . Arthritis    FEET  . Blood transfusion without reported diagnosis 1974   kidney removed - L  . Cancer ([place])    skin ca right shoulder, plastic dsyplasia, pre-Ca polpys removed on Colonoscopy- 07/2014  . Colon polyps 07/23/2014   Tubular adenomas x 5  . Constipation   . Diabetes mellitus without complication (HCC)    Type 2  . Difficult intubation    unsure of actual problem but it was during the January 28, 2015 procedure.  Marland Kitchen Dysrhythmia   . Eczema   . ESRD (end stage renal disease) ([place])    hEMO mwf  . Headache(784.0)    slight dull h/a due kidney failure  . Hyperlipidemia   . Hypertension    SVT h/o , followed by Dr. Claiborne Billings  . Hypothyroidism   . Macular degeneration   . Neuropathy    right hand and both feet  . PAT (paroxysmal atrial tachycardia) ([place])   . PSVT (paroxysmal supraventricular tachycardia) ([place])   . SBO (small bowel obstruction) ([place])   . Shortness of breath    with exertion  . Sleep apnea 06/18/11   split-night sleep study- [facility]., BiPAP- 13-16   . Thyroid disease   . Umbilical hernia    will repair with thi surgery    Past Surgical History:  Procedure Laterality Date  . AV FISTULA PLACEMENT Right 02/25/2015   Procedure: RIGHT ARTERIOVENOUS (AV) FISTULA CREATION;  Surgeon: Serafina Mitchell, MD;  Location: [facility];  Service: Vascular;  Laterality: Right;  . AV FISTULA PLACEMENT Right 09/15/2015   Procedure: ARTERIOVENOUS (AV) FISTULA CREATION- RIGHT ARM;  Surgeon: Serafina Mitchell, MD;  Location: [facility];  Service: Vascular;  Laterality: Right;  . CAPD INSERTION N/A 05/18/2014   Procedure: LAPAROSCOPIC INSERTION CONTINUOUS AMBULATORY PERITONEAL DIALYSIS  (CAPD) CATHETER;  Surgeon: Ralene Ok, MD;  Location: [facility];  Service: General;  Laterality: N/A;  . COLONOSCOPY    . COLONOSCOPY WITH PROPOFOL N/A 07/12/2016   Procedure: COLONOSCOPY WITH PROPOFOL;  Surgeon: [name], MD;  [facility]: [facility];  Service: Endoscopy;  Laterality: N/A;  . declotting of fistula  08/2015  . ESOPHAGOGASTRODUODENOSCOPY (EGD) WITH PROPOFOL N/A 07/12/2016   Procedure: ESOPHAGOGASTRODUODENOSCOPY (EGD) WITH PROPOFOL;  Surgeon: [name], MD;  [facility];  Service: Endoscopy;  Laterality: N/A;  . FISTULA SUPERFICIALIZATION Right 02/07/2016   Procedure: RIIGHT UPPER ARM FISTULA SUPERFICIALIZATION;  Surgeon: Elam Dutch, MD;  Location: [facility];  Service: Vascular;  Laterality: Right;  . IJ  catheter insertion    . INSERTION OF DIALYSIS CATHETER Right 02/25/2015   Procedure: INSERTION OF RIGHT INTERNAL JUGULAR DIALYSIS CATHETER;  Surgeon: Serafina Mitchell, MD;  Location: [facility];  Service: Vascular;  Laterality: Right;  . KIDNEY TRANSPLANT    . LAPAROSCOPIC REPOSITIONING CAPD CATHETER N/A 06/16/2014   Procedure: LAPAROSCOPIC REPOSITIONING CAPD CATHETER;  Surgeon: Ralene Ok, MD;  Location: [facility];  Service: General;  Laterality: N/A;  . LAPAROSCOPY N/A 05/04/2015   Procedure: LAPAROSCOPY DIAGNOSTIC LYSIS OF ADHESIONS;  Surgeon: Coralie Keens, MD;  Location: [facility];  Service: General;  Laterality: N/A;  . MINOR REMOVAL OF PERITONEAL DIALYSIS CATHETER N/A 04/28/2015   Procedure:  REMOVAL OF PERITONEAL DIALYSIS CATHETER;  Surgeon: Coralie Keens, MD;  Location: [facility];  Service: General;  Laterality: N/A;  . NEPHRECTOMY Left 1974  . RENAL BIOPSY Right 2012  . REVISON OF ARTERIOVENOUS FISTULA Right 05/26/2015   Procedure: SUPERFICIALIZATION OF ARTERIOVENOUS FISTULA WITH SIDE BRANCH LIGATIONS;  Surgeon: Serafina Mitchell, MD;  Location: [facility];  Service: Vascular;  Laterality: Right;  . SKIN CANCER EXCISION     right shoulder  . SVT ABLATION N/A 11/23/2016   Procedure: SVT Ablation;  Surgeon: Will Meredith Leeds, MD;  Location: [facility];  Service: Cardiovascular;  Laterality: N/A;  . UMBILICAL HERNIA REPAIR N/A 05/18/2014   Procedure: HERNIA REPAIR UMBILICAL ADULT;  Surgeon: Ralene Ok, MD;  Location: [facility];  Service: General;  Laterality: N/A;  . UMBILICAL HERNIA REPAIR N/A 04/28/2015   Procedure: UMBILICAL HERNIA REPAIR WITH MESH;  Surgeon: Coralie Keens, MD;  Location: [facility];  Service: General;  Laterality: N/A;     reports that he quit smoking about 4 years ago. His smoking use included Cigars. He has never used smokeless tobacco. He reports that he drinks alcohol. He reports that he does not use drugs.  No Known Allergies  Family History  Problem Relation Age of Onset  . Colon polyps Father   . Colon cancer Neg Hx   . Stomach cancer Neg Hx      Prior to Admission medications   Medication Sig Start Date End Date Taking? Authorizing Provider  acetaminophen (TYLENOL) 500 MG tablet Take 1,000 mg by mouth every 12 (twelve) hours as needed for mild pain.   Yes [provider]  amLODipine (NORVASC) 5 MG tablet Take 10 mg by mouth daily.   Yes [provider]  aspirin EC 81 MG tablet Take 81 mg by mouth every morning.   Yes [provider]  belatacept (NULOJIX) 250 MG SOLR injection  Inject 24.5 mLs into the vein every 28 (twenty-eight) days.  08/30/17  Yes [provider]  dextromethorphan-guaiFENesin (MUCINEX DM) 30-600 MG 12hr tablet Take 1 tablet by mouth 2 (two) times daily.   Yes [provider]  DULoxetine (CYMBALTA) 30 MG capsule Take 30 mg by mouth daily.  03/09/16  Yes [provider]  fluocinonide cream (LIDEX) 1.93 % Apply 1 application topically daily as needed (Skin inflammation (hand, thigh, back)).    Yes [provider]  furosemide (LASIX) 20 MG tablet Take 40 mg by mouth daily.    Yes [provider]  gabapentin (NEURONTIN) 100 MG capsule Take 200-300 mg by mouth See admin instructions. 200 mg in the morning and 300 mg at bedtime 06/02/16  Yes [provider]  glucose blood (ONETOUCH VERIO) test strip Use to check blood sugars up to 6 times daily 04/23/17  Yes Elayne Snare, MD  HUMULIN R 500 UNIT/ML injection Using a U-100 1cc/31G/5/16" INSULIN SYRINGE, draw up and inject 5-18 units into the skin three times a day 30 minutes prior to each meal 05/30/17  Yes Elayne Snare, MD  levothyroxine (SYNTHROID, LEVOTHROID) 100 MCG tablet Take 1 tablet (100 mcg total) by mouth daily. 04/23/17  Yes Elayne Snare, MD  metoprolol tartrate (LOPRESSOR) 25 MG tablet Take 50 mg by mouth 2 (two) times daily.  06/01/17  Yes [provider]  mycophenolate (CELLCEPT) 250 MG capsule Take 1,000 mg by mouth every 12 (twelve) hours.    Yes [provider]  oseltamivir (TAMIFLU) 30 MG capsule Take 1 capsule (30 mg total) by mouth daily. 07/23/17 07/28/17 Yes Young, Vanessa Carlton, PA-C  polyethylene glycol (MIRALAX / GLYCOLAX) packet Take 17 g by mouth daily as needed for mild constipation.    Yes [provider]  predniSONE (DELTASONE) 5 MG tablet Take 5 mg by mouth daily with breakfast.   Yes [provider]  ranitidine (ZANTAC) 150 MG tablet Take 150 mg by mouth at bedtime. 03/19/17  Yes [provider]    rosuvastatin (CRESTOR) 10 MG tablet Take 1 tablet (10 mg total) by mouth daily. 04/23/17  Yes Elayne Snare, MD  senna-docusate (SENOKOT-S) 8.6-50 MG tablet Take 1 tablet by mouth as needed for mild constipation.   Yes [provider]  sulfamethoxazole-trimethoprim (BACTRIM DS,SEPTRA DS) 800-160 MG tablet Take 1 tablet by mouth every Monday, Wednesday, and Friday.    Yes [provider]  Tacrolimus (ENVARSUS XR) 1 MG TB24 Take 1 mg by mouth daily.  07/09/17  Yes [provider]  tamsulosin (FLOMAX) 0.4 MG CAPS capsule Take 0.8 mg by mouth at bedtime.    Yes [provider]  traMADol (ULTRAM) 50 MG tablet Take 1 tablet (50 mg total) by mouth every 6 (six) hours as needed. Patient taking differently: Take 50-100 mg by mouth every 6 (six) hours as needed (for pain).  02/07/16  Yes Ulyses Amor, PA-C  traZODone (DESYREL) 50 MG tablet Take 50-100 mg by mouth at bedtime as needed for sleep.  08/18/16  Yes [provider]  TRESIBA FLEXTOUCH 100 UNIT/ML SOPN FlexTouch Pen INJECT 30 UNITS INTO THE SKIN EVERY MORNING Patient taking differently: INJECT 16 UNITS INTO THE SKIN EVERY MORNING 11/15/16  Yes Elayne Snare, MD  UNABLE TO FIND BiPAP: At bedtime   Yes [provider]  valGANciclovir (VALCYTE) 450 MG tablet Take 900 mg by mouth daily.    Yes [provider]  zolpidem (AMBIEN) 10 MG tablet Take 10-20 mg by mouth at bedtime as needed for sleep.    Yes [provider]  Insulin Pen Needle (B-D UF III MINI PEN NEEDLES) 31G X 5 MM MISC USE 4 PER DAY TO INJECT INSULIN. 04/23/17   Elayne Snare, MD  Insulin Syringe-Needle U-100 (BD INSULIN SYRINGE ULTRAFINE) 31G X 5/16" 0.3 ML MISC Use to inject insulin 2-3 times per day 04/23/17   Elayne Snare, MD  [facility] DELICA LANCETS FINE MISC Use to check blood sugars 4-6 times daily 04/23/17   Elayne Snare, MD    Physical Exam: Vitals:   07/26/17 1615 07/26/17 1645 07/26/17 1700 07/26/17 1930  BP: 118/73  (!) 144/74 (!) 153/101 139/87  Pulse: 79 79 80 76  Resp:      Temp:      TempSrc:      SpO2: 98% 98% 96% 98%  Weight:      Height:  Constitutional: NAD, calm, comfortable Eyes: PERRL, lids and conjunctivae normal ENMT: Mucous membranes are moist. Posterior pharynx clear of any exudate or lesions.Normal dentition.  Neck: normal, supple, no masses, no thyromegaly Respiratory: clear to auscultation bilaterally, no wheezing, no crackles. Normal respiratory effort. No accessory muscle use.  Cardiovascular: Regular rate and rhythm, no murmurs / rubs / gallops. No extremity edema. 2+ pedal pulses. No carotid bruits.  Abdomen: no tenderness, no masses palpated. No hepatosplenomegaly. Bowel sounds positive.  Musculoskeletal: no clubbing / cyanosis. No joint deformity upper and lower extremities. Good ROM, no contractures. Normal muscle tone.  Skin: no rashes, lesions, ulcers. No induration Neurologic: CN 2-12 grossly intact. Sensation intact, DTR normal. Strength 5/5 in all 4.  Psychiatric: Normal judgment and insight. Alert and oriented x 3. Normal mood.    Labs on Admission: I have personally reviewed following labs and imaging studies  CBC:  Recent Labs Lab 07/26/17 1105  WBC 5.2  HGB 12.8*  HCT 40.3  MCV 89.6  PLT 016   Basic Metabolic Panel:  Recent Labs Lab 07/26/17 1105  NA 136  K 4.9  CL 108  CO2 21*  GLUCOSE 177*  BUN 26*  CREATININE 1.67*  CALCIUM 9.8   GFR: Estimated Creatinine Clearance: 64.2 mL/min (A) (by C-G formula based on SCr of 1.67 mg/dL (H)). Liver Function Tests:  Recent Labs Lab 07/26/17 1105  AST 14*  ALT 18  ALKPHOS 122  BILITOT 0.6  PROT 6.5  ALBUMIN 4.2   No results for input(s): LIPASE, AMYLASE in the last 168 hours. No results for input(s): AMMONIA in the last 168 hours. Coagulation Profile: No results for input(s): INR, PROTIME in the last 168 hours. Cardiac Enzymes: No results for input(s): CKTOTAL, CKMB, CKMBINDEX,  TROPONINI in the last 168 hours. BNP (last 3 results) No results for input(s): PROBNP in the last 8760 hours. HbA1C: No results for input(s): HGBA1C in the last 72 hours. CBG:  Recent Labs Lab 07/26/17 1658 07/26/17 1836  GLUCAP 56* 88   Lipid Profile: No results for input(s): CHOL, HDL, LDLCALC, TRIG, CHOLHDL, LDLDIRECT in the last 72 hours. Thyroid Function Tests: No results for input(s): TSH, T4TOTAL, FREET4, T3FREE, THYROIDAB in the last 72 hours. Anemia Panel: No results for input(s): VITAMINB12, FOLATE, FERRITIN, TIBC, IRON, RETICCTPCT in the last 72 hours. Urine analysis:    Component Value Date/Time   COLORURINE YELLOW 07/31/2014 1658   APPEARANCEUR HAZY (A) 07/31/2014 1658   LABSPEC 1.016 07/31/2014 1658   PHURINE 5.0 07/31/2014 1658   GLUCOSEU 250 (A) 07/31/2014 1658   GLUCOSEU 100 (A) 01/04/2014 0907   HGBUR TRACE (A) 07/31/2014 1658   BILIRUBINUR NEGATIVE 07/31/2014 1658   KETONESUR NEGATIVE 07/31/2014 1658   PROTEINUR >300 (A) 07/31/2014 1658   UROBILINOGEN 0.2 07/31/2014 1658   NITRITE NEGATIVE 07/31/2014 1658   LEUKOCYTESUR NEGATIVE 07/31/2014 1658    Radiological Exams on Admission: Ct Abdomen Pelvis Wo Contrast  Result Date: 07/26/2017 CLINICAL DATA:  GI bleed history of colon polyp EXAM: CT ABDOMEN AND PELVIS WITHOUT CONTRAST TECHNIQUE: Multidetector CT imaging of the abdomen and pelvis was performed following the standard protocol without IV contrast. COMPARISON:  06/04/2017, 05/11/2015 FINDINGS: Lower chest: Lung bases demonstrate no acute consolidation. Mild atelectasis at the posterior lung bases. No significant pleural effusion Hepatobiliary: No focal liver abnormality is seen. No gallstones, gallbladder wall thickening, or biliary dilatation. Pancreas: Unremarkable. No pancreatic ductal dilatation or surrounding inflammatory changes. Spleen: Normal in size without focal abnormality. Adrenals/Urinary Tract: Adrenal glands are  within normal limits.  Atrophic right kidney. Stable round soft tissue density in the expected location of left kidney measuring 18 mm. Bladder unremarkable. Transplanted kidney in the right lower quadrant with minimal perinephric stranding. No hydronephrosis. Stomach/Bowel: The stomach is nonenlarged. No dilated small bowel. Small lipoma in the distal ileum, re- demonstrated. No colon wall thickening. Mild sigmoid colon diverticular disease without acute inflammation. Vascular/Lymphatic: Aortic atherosclerosis. No aneurysmal dilatation. No significantly enlarged lymph nodes Reproductive: Prostate is unremarkable. Other: Oblong fluid density in extending into the right inguinal canal is re- demonstrated, this measures 16 cm cranial caudad by 3.6 cm transverse and emanate from the lower pole region of the right lower quadrant transplanted kidney. No free air or significant free fluid Musculoskeletal: Degenerative changes of the spine. No acute or suspicious bone lesion IMPRESSION: 1. No CT evidence for acute intra-abdominal or pelvic abnormality. 2. Atrophic native right kidney. No visible left native renal tissue with stable round soft tissue density at the expected location of the left kidney. 3. Transplanted right lower quadrant kidney with nonspecific perinephric fat stranding. Re- demonstrated large oblong fluid collection extending from the lower pole of right lower quadrant transplant kidney, into the right inguinal canal. 4. Mild sigmoid colon diverticular disease without acute inflammation. Electronically Signed   By: Donavan Foil M.D.   On: 07/26/2017 20:25    EKG: Independently reviewed.  Assessment/Plan Principal Problem:   GI bleed Active Problems:   HTN (hypertension)   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, with long-term current use of insulin ([place])   Kidney transplant status    1. GI bleed - 1. Repeat CBC in AM 2. Holding lasix 3. Clear liquid diet for  now 4. Will put on PPI bolus and gtt for now 5. Holding ASA 6. Tele monitor 7. GI to see in AM 2. H/o Kidney transplant - 1. Continue home anti-rejection meds including prednisone 2. Continue antiviral and ABx 3. Note that he is on tamiflu for post-exposure PPx (doesnt have flu symptoms at this time). 4. Confirmed dosing of all home meds with patient 3. HTN - 1. Continue home meds 4. DM2 - 1. Continue Degludic 16 daily 2. Continue mealtime carb counting with U500 (have put in for pharm consult to put this in computer)  DVT prophylaxis: SCDs Code Status: Full Family Communication: No family in room Disposition Plan: Home after admit Consults called: GI called by EDP Admission status: Admit to inpatient   [place], [place] [facility] Pager [phone]  If 7AM-7PM, please contact day team taking care of patient [email or web address] Password [email or web address]  07/27/2017, 12:00 AM

## 2017-07-27 NOTE — ED Notes (Signed)
Nurse starting IV and collecting labs.

## 2017-07-27 NOTE — Consult Note (Signed)
Referring Provider:  Dr. Wynelle Cleveland Primary Care Physician:  Osborne Casco [name], MD Primary Gastroenterologist:  Dr. Ardis Hughs  Reason for Consultation:  GIB  HPI: Robert [name] is a 56 y.o. male with medical history significant of DM2, kidney transplant in May 2018, HTN.  Patient presented to the ED with 1 day history of maroon colored stool.  Loose painless, burgundy colored stools associated with intermittent right sided abdominal discomfort that he rated 2/10 at its worse.  No fevers, N/V.   No h/o PUD previously, no NSAIDS though he is on prednisone 5mg  daily for anti-rejection of kidney transplant.  He denies heartburn/reflux issues but has been on zantac daily at home for preventative measures since he is on so many medications.  He had one episode of bloody stools at home that he is aware of.  He called his transplant doctors at [facility] and they told him to come to the ED.  He had two other episodes of bleeding since coming to the hospital, but none since 7 pm last night, so now 14 hours.  Is on clear liquid diet.  Never had GI bleeding in the past.  CT scan abdomen and pelvis without contrast was unremarkable.  Hgb with mild drop from 12.8 grams to 11.8 grams after IV fluids.  They currently have him on PPI gtt.  EGD and colonoscopy 07/2016 by Dr. Ardis Hughs.  EGD was normal.  Colonoscopy showed multiple small mouth diverticula in the left colon.  One 4 mm polyp was removed and was a tubular adenoma on pathology--repeat colonoscopy recommended in 5 years.   Past Medical History:  Diagnosis Date  . Anemia    low hgb at present  . Arthritis    FEET  . Blood transfusion without reported diagnosis 1974   kidney removed - L  . Cancer ([place])    skin ca right shoulder, plastic dsyplasia, pre-Ca polpys removed on Colonoscopy- 07/2014  . Colon polyps 07/23/2014   Tubular adenomas x 5  . Constipation   . Diabetes mellitus without complication (HCC)    Type 2  . Difficult intubation    unsure of  actual problem but it was during the January 28, 2015 procedure.  Marland Kitchen Dysrhythmia   . Eczema   . ESRD (end stage renal disease) ([place])    hEMO mwf  . Headache(784.0)    slight dull h/a due kidney failure  . Hyperlipidemia   . Hypertension    SVT h/o , followed by Dr. Claiborne Billings  . Hypothyroidism   . Macular degeneration   . Neuropathy    right hand and both feet  . PAT (paroxysmal atrial tachycardia) ([place])   . PSVT (paroxysmal supraventricular tachycardia) ([place])   . SBO (small bowel obstruction) ([place])   . Shortness of breath    with exertion  . Sleep apnea 06/18/11   split-night sleep study- [facility]., BiPAP- 13-16  . Thyroid disease   . Umbilical hernia    will repair with thi surgery    Past Surgical History:  Procedure Laterality Date  . AV FISTULA PLACEMENT Right 02/25/2015   Procedure: RIGHT ARTERIOVENOUS (AV) FISTULA CREATION;  Surgeon: Serafina Mitchell, MD;  Location: [place];  Service: Vascular;  Laterality: Right;  . AV FISTULA PLACEMENT Right 09/15/2015   Procedure: ARTERIOVENOUS (AV) FISTULA CREATION- RIGHT ARM;  Surgeon: Serafina Mitchell, MD;  Location: [place];  Service: Vascular;  Laterality: Right;  . CAPD INSERTION N/A 05/18/2014   Procedure: LAPAROSCOPIC INSERTION  CONTINUOUS AMBULATORY PERITONEAL DIALYSIS  (CAPD) CATHETER;  Surgeon: Ralene Ok, MD;  Location: [place];  Service: General;  Laterality: N/A;  . COLONOSCOPY    . COLONOSCOPY WITH PROPOFOL N/A 07/12/2016   Procedure: COLONOSCOPY WITH PROPOFOL;  Surgeon: [name], MD;  Location: [facility];  Service: Endoscopy;  Laterality: N/A;  . declotting of fistula  08/2015  . ESOPHAGOGASTRODUODENOSCOPY (EGD) WITH PROPOFOL N/A 07/12/2016   Procedure: ESOPHAGOGASTRODUODENOSCOPY (EGD) WITH PROPOFOL;  Surgeon: [name], MD;  Location: [facility];  Service: Endoscopy;  Laterality: N/A;  . FISTULA SUPERFICIALIZATION Right 02/07/2016   Procedure: RIIGHT UPPER ARM FISTULA SUPERFICIALIZATION;   Surgeon: Elam Dutch, MD;  Location: [place];  Service: Vascular;  Laterality: Right;  . IJ catheter insertion    . INSERTION OF DIALYSIS CATHETER Right 02/25/2015   Procedure: INSERTION OF RIGHT INTERNAL JUGULAR DIALYSIS CATHETER;  Surgeon: Serafina Mitchell, MD;  Location: [place];  Service: Vascular;  Laterality: Right;  . KIDNEY TRANSPLANT    . LAPAROSCOPIC REPOSITIONING CAPD CATHETER N/A 06/16/2014   Procedure: LAPAROSCOPIC REPOSITIONING CAPD CATHETER;  Surgeon: Ralene Ok, MD;  Location: [place];  Service: General;  Laterality: N/A;  . LAPAROSCOPY N/A 05/04/2015   Procedure: LAPAROSCOPY DIAGNOSTIC LYSIS OF ADHESIONS;  Surgeon: Coralie Keens, MD;  Location: [place];  Service: General;  Laterality: N/A;  . MINOR REMOVAL OF PERITONEAL DIALYSIS CATHETER N/A 04/28/2015   Procedure:  REMOVAL OF PERITONEAL DIALYSIS CATHETER;  Surgeon: Coralie Keens, MD;  Location: [place];  Service: General;  Laterality: N/A;  . NEPHRECTOMY Left 1974  . RENAL BIOPSY Right 2012  . REVISON OF ARTERIOVENOUS FISTULA Right 05/26/2015   Procedure: SUPERFICIALIZATION OF ARTERIOVENOUS FISTULA WITH SIDE BRANCH LIGATIONS;  Surgeon: Serafina Mitchell, MD;  Location: [place];  Service: Vascular;  Laterality: Right;  . SKIN CANCER EXCISION     right shoulder  . SVT ABLATION N/A 11/23/2016   Procedure: SVT Ablation;  Surgeon: Will Meredith Leeds, MD;  Location: [place] [facility];  Service: Cardiovascular;  Laterality: N/A;  . UMBILICAL HERNIA REPAIR N/A 05/18/2014   Procedure: HERNIA REPAIR UMBILICAL ADULT;  Surgeon: Ralene Ok, MD;  Location: [place];  Service: General;  Laterality: N/A;  . UMBILICAL HERNIA REPAIR N/A 04/28/2015   Procedure: UMBILICAL HERNIA REPAIR WITH MESH;  Surgeon: Coralie Keens, MD;  Location: [place];  Service: General;  Laterality: N/A;    Prior to Admission medications   Medication Sig Start Date End Date Taking? Authorizing Provider  acetaminophen (TYLENOL) 500 MG tablet Take 1,000 mg by mouth  every 12 (twelve) hours as needed for mild pain.   Yes [provider]  amLODipine (NORVASC) 5 MG tablet Take 10 mg by mouth daily.   Yes [provider]  aspirin EC 81 MG tablet Take 81 mg by mouth every morning.   Yes [provider]  belatacept (NULOJIX) 250 MG SOLR injection Inject 24.5 mLs into the vein every 28 (twenty-eight) days.  08/30/17  Yes [provider]  dextromethorphan-guaiFENesin (MUCINEX DM) 30-600 MG 12hr tablet Take 1 tablet by mouth 2 (two) times daily.   Yes [provider]  DULoxetine (CYMBALTA) 30 MG capsule Take 30 mg by mouth daily.  03/09/16  Yes [provider]  fluocinonide cream (LIDEX) 6.96 % Apply 1 application topically daily as needed (Skin inflammation (hand, thigh, back)).    Yes [provider]  furosemide (LASIX) 20 MG tablet Take 40 mg by mouth daily.    Yes [provider]  gabapentin (NEURONTIN) 100 MG capsule Take 200-300 mg by mouth See admin instructions. 200 mg in the morning and 300 mg at bedtime 06/02/16  Yes [provider]  glucose blood (ONETOUCH VERIO) test strip Use to check blood sugars up to 6 times daily 04/23/17  Yes Elayne Snare, MD  HUMULIN R 500 UNIT/ML injection Using a U-100 1cc/31G/5/16" INSULIN SYRINGE, draw up and inject 5-18 units into the skin three times a day 30 minutes prior to each meal 05/30/17  Yes Elayne Snare, MD  levothyroxine (SYNTHROID, LEVOTHROID) 100 MCG tablet Take 1 tablet (100 mcg total) by mouth daily. 04/23/17  Yes Elayne Snare, MD  metoprolol tartrate (LOPRESSOR) 25 MG tablet Take 50 mg by mouth 2 (two) times daily.  06/01/17  Yes [provider]  mycophenolate (CELLCEPT) 250 MG capsule Take 1,000 mg by mouth every 12 (twelve) hours.    Yes [provider]  oseltamivir (TAMIFLU) 30 MG capsule Take 1 capsule (30 mg total) by mouth daily. 07/23/17 07/28/17 Yes Young, Vanessa [place]oka, PA-C  polyethylene glycol (MIRALAX / GLYCOLAX) packet  Take 17 g by mouth daily as needed for mild constipation.    Yes [provider]  predniSONE (DELTASONE) 5 MG tablet Take 5 mg by mouth daily with breakfast.   Yes [provider]  ranitidine (ZANTAC) 150 MG tablet Take 150 mg by mouth at bedtime. 03/19/17  Yes [provider]  rosuvastatin (CRESTOR) 10 MG tablet Take 1 tablet (10 mg total) by mouth daily. 04/23/17  Yes Elayne Snare, MD  senna-docusate (SENOKOT-S) 8.6-50 MG tablet Take 1 tablet by mouth as needed for mild constipation.   Yes [provider]  sulfamethoxazole-trimethoprim (BACTRIM DS,SEPTRA DS) 800-160 MG tablet Take 1 tablet by mouth every Monday, Wednesday, and Friday.    Yes [provider]  Tacrolimus (ENVARSUS XR) 1 MG TB24 Take 1 mg by mouth daily.  07/09/17  Yes [provider]  tamsulosin (FLOMAX) 0.4 MG CAPS capsule Take 0.8 mg by mouth at bedtime.    Yes [provider]  traMADol (ULTRAM) 50 MG tablet Take 1 tablet (50 mg total) by mouth every 6 (six) hours as needed. Patient taking differently: Take 50-100 mg by mouth every 6 (six) hours as needed (for pain).  02/07/16  Yes Ulyses Amor, PA-C  traZODone (DESYREL) 50 MG tablet Take 50-100 mg by mouth at bedtime as needed for sleep.  08/18/16  Yes [provider]  TRESIBA FLEXTOUCH 100 UNIT/ML SOPN FlexTouch Pen INJECT 30 UNITS INTO THE SKIN EVERY MORNING Patient taking differently: INJECT 16 UNITS INTO THE SKIN EVERY MORNING 11/15/16  Yes Elayne Snare, MD  UNABLE TO FIND BiPAP: At bedtime   Yes [provider]  valGANciclovir (VALCYTE) 450 MG tablet Take 900 mg by mouth daily.    Yes [provider]  zolpidem (AMBIEN) 10 MG tablet Take 10-20 mg by mouth at bedtime as needed for sleep.    Yes [provider]  Insulin Pen Needle (B-D UF III MINI PEN NEEDLES) 31G X 5 MM MISC USE 4 PER DAY TO INJECT INSULIN. 04/23/17   Elayne Snare, MD  Insulin Syringe-Needle U-100 (BD INSULIN SYRINGE  ULTRAFINE) 31G X 5/16" 0.3 ML MISC Use to inject insulin 2-3 times per day 04/23/17   Elayne Snare, MD  [facility] DELICA LANCETS FINE MISC Use to check blood sugars 4-6 times daily 04/23/17   Elayne Snare, MD    Current Facility-Administered Medications  Medication Dose Route Frequency Provider  Last Rate Last Dose  . acetaminophen (TYLENOL) tablet 650 mg  650 mg Oral Q6H PRN Etta Quill, DO       Or  . acetaminophen (TYLENOL) suppository 650 mg  650 mg Rectal Q6H PRN Etta Quill, DO      . amLODipine (NORVASC) tablet 10 mg  10 mg Oral Daily Jennette Kettle M, DO      . dextromethorphan-guaiFENesin [facility] DM) 30-600 MG per 12 hr tablet 1 tablet  1 tablet Oral BID Etta Quill, DO   1 tablet at 07/27/17 0216  . DULoxetine (CYMBALTA) DR capsule 30 mg  30 mg Oral Daily Alcario Drought, Jared M, DO      . gabapentin (NEURONTIN) capsule 200 mg  200 mg Oral Daily Alcario Drought, Jared M, DO      . gabapentin (NEURONTIN) capsule 300 mg  300 mg Oral QHS Alcario Drought, Jared M, DO      . insulin aspart (novoLOG) injection 5-25 Units  5-25 Units Subcutaneous TID WC Jennette Kettle M, DO      . insulin glargine (LANTUS) injection 16 Units  16 Units Subcutaneous Daily Alcario Drought, Jared M, DO      . levothyroxine (SYNTHROID, LEVOTHROID) tablet 100 mcg  100 mcg Oral QAC breakfast Jennette Kettle M, DO      . metoprolol tartrate (LOPRESSOR) tablet 50 mg  50 mg Oral BID Jennette Kettle M, DO      . mycophenolate (CELLCEPT) capsule 1,000 mg  1,000 mg Oral Q12H Gibbons, Claudia J, PA-C      . ondansetron [facility]) tablet 4 mg  4 mg Oral Q6H PRN Etta Quill, DO       Or  . ondansetron [facility]) injection 4 mg  4 mg Intravenous Q6H PRN Etta Quill, DO      . oseltamivir (TAMIFLU) capsule 30 mg  30 mg Oral Daily Alcario Drought, Jared M, DO      . pantoprazole (PROTONIX) 80 mg in sodium chloride 0.9 % 250 mL (0.32 mg/mL) infusion  8 mg/hr Intravenous Continuous Jennette Kettle M, DO 25 mL/hr at 07/27/17 0300 8 mg/hr at 07/27/17 0300   . [START ON 07/30/2017] pantoprazole (PROTONIX) injection 40 mg  40 mg Intravenous Q12H Jennette Kettle M, DO      . polyethylene glycol (MIRALAX / GLYCOLAX) packet 17 g  17 g Oral Daily PRN Etta Quill, DO      . predniSONE (DELTASONE) tablet 5 mg  5 mg Oral Q breakfast Alcario Drought, Jared M, DO      . rosuvastatin (CRESTOR) tablet 10 mg  10 mg Oral Daily Jennette Kettle M, DO      . [START ON 07/29/2017] sulfamethoxazole-trimethoprim (BACTRIM DS,SEPTRA DS) 800-160 MG per tablet 1 tablet  1 tablet Oral Q M,W,F Etta Quill, DO      . Tacrolimus TB24 5 mg  5 mg Oral Daily Jennette Kettle M, DO      . tamsulosin [facility]) capsule 0.8 mg  0.8 mg Oral QHS Jennette Kettle M, DO      . traMADol [facility]at[facility] Bourbon) tablet 50-100 mg  50-100 mg Oral Q6H PRN Kinnie Feil, PA-C   100 mg at 07/26/17 2233  . traZODone (DESYREL) tablet 50-100 mg  50-100 mg Oral QHS PRN Etta Quill, DO   50 mg at 07/27/17 0216  . valGANciclovir (VALCYTE) 450 MG tablet TABS 900 mg  900 mg Oral QAC supper Etta Quill, DO        Allergies as of  07/26/2017  . (No Known Allergies)    Family History  Problem Relation Age of Onset  . Colon polyps Father   . Colon cancer Neg Hx   . Stomach cancer Neg Hx     Social History   Social History  . Marital status: Married    Spouse name: N/A  . Number of children: 3  . Years of education: N/A   Occupational History  . 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 Smoking status: Former Smoker    Types: Cigars    Quit date: 12/06/2012  . Smokeless tobacco: Never Used  . Alcohol use Yes     Comment: once/month  . Drug use: No  . Sexual activity: Not on file   Other Topics Concern  . Not on file   Social History Narrative  . No narrative on file    Review of Systems: ROS is O/W negative except as mentioned in HPI.  Physical Exam: Vital signs in last 24 hours: Temp:  [97.6 F (36.4 C)-98.7 F (37.1 C)] 97.6 F (36.4 C) (10/20 0643) Pulse Rate:   [66-80] 71 (10/20 0643) Resp:  [17-18] 18 (10/20 0643) BP: (118-155)/(73-101) 127/79 (10/20 0643) SpO2:  [95 %-99 %] 97 % (10/20 0643) Weight:  [269 lb 6.4 oz (122.2 kg)-279 lb 9.6 oz (126.8 kg)] 279 lb 9.6 oz (126.8 kg) (10/20 0208) Last BM Date: 07/26/17 General:   Alert, Well-developed, well-nourished, pleasant and cooperative in NAD Head:  Normocephalic and atraumatic. Eyes:  Sclera clear, no icterus.  Conjunctiva pink. Ears:  Normal auditory acuity. Mouth:  No deformity or lesions.   Lungs:  Clear throughout to auscultation.  No wheezes, crackles, or rhonchi.  Heart:  Regular rate and rhythm; slight murmur noted. Abdomen:  Soft, non-distended.  BS present.  Minimal left sided TTP.  Large scars from previous surgeries noted on abdomen that are well healed. Msk:  Symmetrical without gross deformities. Pulses:  Normal pulses noted. Extremities:  Without clubbing or edema. Neurologic:  Alert and oriented x 4;  grossly normal neurologically. Skin:  Intact without significant lesions or rashes. Psych:  Alert and cooperative. Normal mood and affect.  Intake/Output from previous day: 10/19 0701 - 10/20 0700 In: 1051.3 [P.O.:480; I.V.:71.3; IV Piggyback:500] Out: 625 [Urine:625]  Lab Results:  Recent Labs  07/26/17 1105 07/27/17 0045 07/27/17 0512  WBC 5.2 6.4 5.2  HGB 12.8* 12.5* 11.8*  HCT 40.3 38.2* 36.3*  PLT 236 246 206   BMET  Recent Labs  07/26/17 1105 07/27/17 0045  NA 136 134*  K 4.9 4.0  CL 108 106  CO2 21* 20*  GLUCOSE 177* 114*  BUN 26* 22*  CREATININE 1.67* 1.73*  CALCIUM 9.8 9.6   LFT  Recent Labs  07/26/17 1105  PROT 6.5  ALBUMIN 4.2  AST 14*  ALT 18  ALKPHOS 122  BILITOT 0.6   PT/INR  Recent Labs  07/27/17 0512  LABPROT 13.6  INR 1.05   Studies/Results: Ct Abdomen Pelvis Wo Contrast  Result Date: 07/26/2017 CLINICAL DATA:  GI bleed history of colon polyp EXAM: CT ABDOMEN AND PELVIS WITHOUT CONTRAST TECHNIQUE: Multidetector CT  imaging of the abdomen and pelvis was performed following the standard protocol without IV contrast. COMPARISON:  06/04/2017, 05/11/2015 FINDINGS: Lower chest: Lung bases demonstrate no acute consolidation. Mild atelectasis at the posterior lung bases. No significant pleural effusion Hepatobiliary: No focal liver abnormality is seen. No gallstones, gallbladder wall thickening, or biliary dilatation. Pancreas: Unremarkable. No pancreatic ductal  dilatation or surrounding inflammatory changes. Spleen: Normal in size without focal abnormality. Adrenals/Urinary Tract: Adrenal glands are within normal limits. Atrophic right kidney. Stable round soft tissue density in the expected location of left kidney measuring 18 mm. Bladder unremarkable. Transplanted kidney in the right lower quadrant with minimal perinephric stranding. No hydronephrosis. Stomach/Bowel: The stomach is nonenlarged. No dilated small bowel. Small lipoma in the distal ileum, re- demonstrated. No colon wall thickening. Mild sigmoid colon diverticular disease without acute inflammation. Vascular/Lymphatic: Aortic atherosclerosis. No aneurysmal dilatation. No significantly enlarged lymph nodes Reproductive: Prostate is unremarkable. Other: Oblong fluid density in extending into the right inguinal canal is re- demonstrated, this measures 16 cm cranial caudad by 3.6 cm transverse and emanate from the lower pole region of the right lower quadrant transplanted kidney. No free air or significant free fluid Musculoskeletal: Degenerative changes of the spine. No acute or suspicious bone lesion IMPRESSION: 1. No CT evidence for acute intra-abdominal or pelvic abnormality. 2. Atrophic native right kidney. No visible left native renal tissue with stable round soft tissue density at the expected location of the left kidney. 3. Transplanted right lower quadrant kidney with nonspecific perinephric fat stranding. Re- demonstrated large oblong fluid collection extending  from the lower pole of right lower quadrant transplant kidney, into the right inguinal canal. 4. Mild sigmoid colon diverticular disease without acute inflammation. Electronically Signed   By: Donavan Foil M.D.   On: 07/26/2017 20:25   IMPRESSION: *GI bleed:  Sounds lower in origin, ? Diverticular.  Hopefully self limited as there has been no evidence of bleeding in 14 hours.  CT scan negative and EGD/colonoscopy one year ago unremarkable.  Low suspicion for UGI from ulcer, etc.  PLAN: *Recommend observation for now. *Monitor Hgb. *Will discontinue PPI gtt and place him on pepcid since he is usually on H2 blocker at home. *Will allow full liquid diet. *If re-bleeds briskly then may need nuclear medicine bleeding scan.   ZEHR, JESSICA D.  07/27/2017, 8:41 AM  Pager number [phone]  ________________________________________________________________________  Velora Heckler GI MD note:  I personally examined the patient, reviewed the data and agree with the assessment and plan described above.  Probably lower GI bleed, seems to have stopped already since last BM, blood was 7pm last night.  If significant, overt rebleeding he will need nuc med bleeding scan.  We'll follow along.   Owens Loffler, MD [facility] Pager [phone]

## 2017-07-27 NOTE — Progress Notes (Signed)
Patient refused CPAP/BiPAP and does not wish to wear one while in hospital. RT will continue to monitor throughout the night as needed.

## 2017-07-27 NOTE — Progress Notes (Signed)
[facility]  56 y/o male with DM2, renal transplant in 5/18, HTN who presents for 1 day of maroon blood mixed with stool. Red blood noted with wiping. No abdominal pain, nausea, vomiting and no h/o hemorrhoids. Recently diagnosed with possible flu and placed on Tamiflu.  Subjective: no BM since last night when it was bloody.  Exam: tender in LLQ, murmur in aortic area 2/6  Principal Problem:   GI bleed - possibly diverticular- full liquids per GI- Bleeding scan if he bleeds again- hold ASA - Hb every 12 hrs - hold Prednisone for today - cont PPI BID and Pepcid per GI   Active Problems: UTI - cont Tamiflu and Mucinex DM    HTN (hypertension) - cont Norvasc and Lopressor    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, with long-term current use of insulin   - cont Lantus    Kidney transplant status - Cellcept, Bactrim, Valganciclovir  Hypothyroid - cont Synthroid  Debbe Odea, MD

## 2017-07-28 DIAGNOSIS — Z992 Dependence on renal dialysis: Secondary | ICD-10-CM

## 2017-07-28 DIAGNOSIS — Z794 Long term (current) use of insulin: Secondary | ICD-10-CM

## 2017-07-28 DIAGNOSIS — E1122 Type 2 diabetes mellitus with diabetic chronic kidney disease: Secondary | ICD-10-CM

## 2017-07-28 DIAGNOSIS — E1165 Type 2 diabetes mellitus with hyperglycemia: Secondary | ICD-10-CM

## 2017-07-28 DIAGNOSIS — E1169 Type 2 diabetes mellitus with other specified complication: Secondary | ICD-10-CM

## 2017-07-28 DIAGNOSIS — E669 Obesity, unspecified: Secondary | ICD-10-CM

## 2017-07-28 DIAGNOSIS — K625 Hemorrhage of anus and rectum: Secondary | ICD-10-CM

## 2017-07-28 DIAGNOSIS — N186 End stage renal disease: Secondary | ICD-10-CM

## 2017-07-28 DIAGNOSIS — Z940 Kidney transplant status: Secondary | ICD-10-CM

## 2017-07-28 DIAGNOSIS — I10 Essential (primary) hypertension: Secondary | ICD-10-CM

## 2017-07-28 LAB — CBC
HEMATOCRIT: 38 % — AB (ref 39.0–52.0)
HEMOGLOBIN: 12.1 g/dL — AB (ref 13.0–17.0)
MCH: 28.5 pg (ref 26.0–34.0)
MCHC: 31.8 g/dL (ref 30.0–36.0)
MCV: 89.4 fL (ref 78.0–100.0)
Platelets: 200 10*3/uL (ref 150–400)
RBC: 4.25 MIL/uL (ref 4.22–5.81)
RDW: 13.6 % (ref 11.5–15.5)
WBC: 5.1 10*3/uL (ref 4.0–10.5)

## 2017-07-28 LAB — GLUCOSE, CAPILLARY
Glucose-Capillary: 129 mg/dL — ABNORMAL HIGH (ref 65–99)
Glucose-Capillary: 195 mg/dL — ABNORMAL HIGH (ref 65–99)

## 2017-07-28 MED ORDER — PREDNISONE 5 MG PO TABS
5.0000 mg | ORAL_TABLET | Freq: Every day | ORAL | Status: DC
  Administered 2017-07-28: 5 mg via ORAL
  Filled 2017-07-28: qty 1

## 2017-07-28 NOTE — Discharge Summary (Signed)
Physician Discharge Summary  Robert [name] VZS:[id number] DOB: [birth year] DOA: 07/26/2017  PCP: Haywood Pao, MD  Admit date: 07/26/2017 Discharge date: 07/28/2017  Admitted From: home Disposition:  home   Recommendations for Outpatient Follow-up:  1. F/u Hb in 1 month  Discharge Condition:  stable   CODE STATUS:  Full code   Consultations:  GI    Discharge Diagnoses:  Principal Problem:   GI bleed Active Problems:   HTN (hypertension)   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, with long-term current use of insulin ([place])   Kidney transplant status    Subjective: Had a BM today which had some dark blood in it. Not lightheaded. No other new symptoms.   Brief Summary: y/o male with DM2, renal transplant in 5/18, HTN who presents for 1 day of maroon blood mixed with stool. Red blood noted with wiping. No abdominal pain, nausea, vomiting and no h/o hemorrhoids. Recently diagnosed with possible flu and placed on Tamiflu.   Hospital Course:  GI bleed - possibly diverticular-  1 bloody BM at home and 2 in ER- today's BM likely contains old blood - Hb 12.8 on admission and has not dropped  - underwent EGD/ Colonoscopy 32yr ago >  Colonoscopy showed multiple small mouth diverticula in the left colon.  One 4 mm polyp was removed and was a tubular adenoma on pathology--repeat colonoscopy recommended in 5 years.  - holding ASA for 3 more days and then resume at prior dose of 81mg  daily  Active Problems: URI - cont Tamiflu and Mucinex DM    HTN (hypertension) - cont Norvasc and Lopressor    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, with long-term current use of insulin   - sugars stable in hospital- cont home regimen    Kidney transplant status - Cellcept, Bactrim, Valganciclovir  Hypothyroid - cont Synthroid   Discharge Instructions   Allergies as of 07/28/2017    No Known Allergies     Medication List    STOP taking these medications   aspirin EC 81 MG tablet     TAKE these medications   acetaminophen 500 MG tablet Commonly known as:  TYLENOL Take 1,000 mg by mouth every 12 (twelve) hours as needed for mild pain.   amLODipine 5 MG tablet Commonly known as:  NORVASC Take 10 mg by mouth daily.   dextromethorphan-guaiFENesin 30-600 MG 12hr tablet Commonly known as:  MUCINEX DM Take 1 tablet by mouth 2 (two) times daily.   DULoxetine 30 MG capsule Commonly known as:  CYMBALTA Take 30 mg by mouth daily.   ENVARSUS XR 1 MG Tb24 Generic drug:  Tacrolimus Take 1 mg by mouth daily.   fluocinonide cream 0.05 % Commonly known as:  LIDEX Apply 1 application topically daily as needed (Skin inflammation (hand, thigh, back)).   furosemide 20 MG tablet Commonly known as:  LASIX Take 40 mg by mouth daily.   gabapentin 100 MG capsule Commonly known as:  NEURONTIN Take 200-300 mg by mouth See admin instructions. 200 mg in the morning and 300 mg at bedtime   glucose blood test strip Commonly known as:  ONETOUCH VERIO Use to check blood sugars up to 6 times daily   HUMULIN R 500 UNIT/ML injection Generic drug:  insulin regular human CONCENTRATED Using a U-100 1cc/31G/5/16" INSULIN SYRINGE, draw up and inject 5-18 units into the skin three times a day 30 minutes prior to each  meal   Insulin Pen Needle 31G X 5 MM Misc Commonly known as:  B-D UF III MINI PEN NEEDLES USE 4 PER DAY TO INJECT INSULIN.   Insulin Syringe-Needle U-100 31G X 5/16" 0.3 ML Misc Commonly known as:  BD INSULIN SYRINGE ULTRAFINE Use to inject insulin 2-3 times per day   levothyroxine 100 MCG tablet Commonly known as:  SYNTHROID, LEVOTHROID Take 1 tablet (100 mcg total) by mouth daily.   metoprolol tartrate 25 MG tablet Commonly known as:  LOPRESSOR Take 50 mg by mouth 2 (two) times daily.   mycophenolate 250 MG capsule Commonly known as:  CELLCEPT Take 1,000  mg by mouth every 12 (twelve) hours.   NULOJIX 250 MG Solr injection Generic drug:  belatacept Inject 24.5 mLs into the vein every 28 (twenty-eight) days. Start taking on:  60/73/7106   [facility] DELICA LANCETS FINE Misc Use to check blood sugars 4-6 times daily   oseltamivir 30 MG capsule Commonly known as:  TAMIFLU Take 1 capsule (30 mg total) by mouth daily.   polyethylene glycol packet Commonly known as:  MIRALAX / GLYCOLAX Take 17 g by mouth daily as needed for mild constipation.   predniSONE 5 MG tablet Commonly known as:  DELTASONE Take 5 mg by mouth daily with breakfast.   ranitidine 150 MG tablet Commonly known as:  ZANTAC Take 150 mg by mouth at bedtime.   rosuvastatin 10 MG tablet Commonly known as:  CRESTOR Take 1 tablet (10 mg total) by mouth daily.   senna-docusate 8.6-50 MG tablet Commonly known as:  Senokot-S Take 1 tablet by mouth as needed for mild constipation.   sulfamethoxazole-trimethoprim 800-160 MG tablet Commonly known as:  BACTRIM DS,SEPTRA DS Take 1 tablet by mouth every Monday, Wednesday, and Friday.   tamsulosin 0.4 MG Caps capsule Commonly known as:  FLOMAX Take 0.8 mg by mouth at bedtime.   traMADol 50 MG tablet Commonly known as:  ULTRAM Take 1 tablet (50 mg total) by mouth every 6 (six) hours as needed. What changed:  how much to take  reasons to take this   traZODone 50 MG tablet Commonly known as:  DESYREL Take 50-100 mg by mouth at bedtime as needed for sleep.   TRESIBA FLEXTOUCH 100 UNIT/ML Sopn FlexTouch Pen Generic drug:  insulin degludec INJECT 30 UNITS INTO THE SKIN EVERY MORNING What changed:  See the new instructions.   UNABLE TO FIND BiPAP: At bedtime   valGANciclovir 450 MG tablet Commonly known as:  VALCYTE Take 900 mg by mouth daily.   zolpidem 10 MG tablet Commonly known as:  AMBIEN Take 10-20 mg by mouth at bedtime as needed for sleep.      Follow-up Information    [name], MD Follow up.    Specialty:  Gastroenterology Why:  as needed Contact information: [address] [address] [phone]        [name], [name], MD Follow up in 2 week(s).   Specialty:  Internal Medicine Contact information: [address] [place] [address] [phone]          No Known Allergies   Procedures/Studies:    Ct Abdomen Pelvis Wo Contrast  Result Date: 07/26/2017 CLINICAL DATA:  GI bleed history of colon polyp EXAM: CT ABDOMEN AND PELVIS WITHOUT CONTRAST TECHNIQUE: Multidetector CT imaging of the abdomen and pelvis was performed following the standard protocol without IV contrast. COMPARISON:  06/04/2017, 05/11/2015 FINDINGS: Lower chest: Lung bases demonstrate no acute consolidation. Mild atelectasis  at the posterior lung bases. No significant pleural effusion Hepatobiliary: No focal liver abnormality is seen. No gallstones, gallbladder wall thickening, or biliary dilatation. Pancreas: Unremarkable. No pancreatic ductal dilatation or surrounding inflammatory changes. Spleen: Normal in size without focal abnormality. Adrenals/Urinary Tract: Adrenal glands are within normal limits. Atrophic right kidney. Stable round soft tissue density in the expected location of left kidney measuring 18 mm. Bladder unremarkable. Transplanted kidney in the right lower quadrant with minimal perinephric stranding. No hydronephrosis. Stomach/Bowel: The stomach is nonenlarged. No dilated small bowel. Small lipoma in the distal ileum, re- demonstrated. No colon wall thickening. Mild sigmoid colon diverticular disease without acute inflammation. Vascular/Lymphatic: Aortic atherosclerosis. No aneurysmal dilatation. No significantly enlarged lymph nodes Reproductive: Prostate is unremarkable. Other: Oblong fluid density in extending into the right inguinal canal is re- demonstrated, this measures 16 cm cranial caudad by 3.6 cm transverse and emanate from the lower pole region of the right  lower quadrant transplanted kidney. No free air or significant free fluid Musculoskeletal: Degenerative changes of the spine. No acute or suspicious bone lesion IMPRESSION: 1. No CT evidence for acute intra-abdominal or pelvic abnormality. 2. Atrophic native right kidney. No visible left native renal tissue with stable round soft tissue density at the expected location of the left kidney. 3. Transplanted right lower quadrant kidney with nonspecific perinephric fat stranding. Re- demonstrated large oblong fluid collection extending from the lower pole of right lower quadrant transplant kidney, into the right inguinal canal. 4. Mild sigmoid colon diverticular disease without acute inflammation. Electronically Signed   By: Donavan Foil M.D.   On: 07/26/2017 20:25   Dg Chest 2 View  Result Date: 07/23/2017 CLINICAL DATA:  [place] on RIGHT side 4 days ago, upper rib pain, cough, history diabetes mellitus, hypertension, end-stage renal disease EXAM: CHEST  2 VIEW COMPARISON:  09/14/2016 FINDINGS: Normal heart size, mediastinal contours, and pulmonary vascularity. Vascular stent at RIGHT subclavian vessels. Bibasilar atelectasis. Minimal central peribronchial thickening. No acute infiltrate, pleural effusion or pneumothorax. No definite acute osseous findings. IMPRESSION: Bronchitic changes with bibasilar atelectasis. Electronically Signed   By: Lavonia Dana M.D.   On: 07/23/2017 16:55       Discharge Exam: Vitals:   07/28/17 0546 07/28/17 0934  BP: (!) 132/101 132/67  Pulse: 65 66  Resp: 18 18  Temp: 98 F (36.7 C) 98.3 F (36.8 C)  SpO2: 99% 99%   Vitals:   07/27/17 1716 07/27/17 2050 07/28/17 0546 07/28/17 0934  BP: 128/77 (!) 120/57 (!) 132/101 132/67  Pulse: 70 69 65 66  Resp: 18 18 18 18   Temp: 98.3 F (36.8 C) 98.2 F (36.8 C) 98 F (36.7 C) 98.3 F (36.8 C)  TempSrc: Oral Oral Oral Oral  SpO2: 99% 99% 99% 99%  Weight:  125.3 kg (276 lb 3.2 oz)    Height:        General: Pt is  alert, awake, not in acute distress Cardiovascular: RRR, S1/S2 +, no rubs, no gallops Respiratory: CTA bilaterally, no wheezing, no rhonchi Abdominal: Soft, NT, ND, bowel sounds + Extremities: no edema, no cyanosis    The results of significant diagnostics from this hospitalization (including imaging, microbiology, ancillary and laboratory) are listed below for reference.     Microbiology: No results found for this or any previous visit (from the past 240 hour(s)).   Labs: BNP (last 3 results) No results for input(s): BNP in the last 8760 hours. Basic Metabolic Panel:  Recent Labs Lab 07/26/17 1105 07/27/17 0045  NA 136 134*  K 4.9 4.0  CL 108 106  CO2 21* 20*  GLUCOSE 177* 114*  BUN 26* 22*  CREATININE 1.67* 1.73*  CALCIUM 9.8 9.6   Liver Function Tests:  Recent Labs Lab 07/26/17 1105  AST 14*  ALT 18  ALKPHOS 122  BILITOT 0.6  PROT 6.5  ALBUMIN 4.2   No results for input(s): LIPASE, AMYLASE in the last 168 hours. No results for input(s): AMMONIA in the last 168 hours. CBC:  Recent Labs Lab 07/26/17 1105 07/27/17 0045 07/27/17 0512 07/28/17 0433  WBC 5.2 6.4 5.2 5.1  HGB 12.8* 12.5* 11.8* 12.1*  HCT 40.3 38.2* 36.3* 38.0*  MCV 89.6 89.9 89.4 89.4  PLT 236 246 206 200   Cardiac Enzymes: No results for input(s): CKTOTAL, CKMB, CKMBINDEX, TROPONINI in the last 168 hours. BNP: Invalid input(s): POCBNP CBG:  Recent Labs Lab 07/27/17 0759 07/27/17 1158 07/27/17 1706 07/27/17 2210 07/28/17 0803  GLUCAP 115* 156* 170* 153* 129*   D-Dimer No results for input(s): DDIMER in the last 72 hours. Hgb A1c No results for input(s): HGBA1C in the last 72 hours. Lipid Profile No results for input(s): CHOL, HDL, LDLCALC, TRIG, CHOLHDL, LDLDIRECT in the last 72 hours. Thyroid function studies No results for input(s): TSH, T4TOTAL, T3FREE, THYROIDAB in the last 72 hours.  Invalid input(s): FREET3 Anemia work up No results for input(s): VITAMINB12,  FOLATE, FERRITIN, TIBC, IRON, RETICCTPCT in the last 72 hours. Urinalysis    Component Value Date/Time   COLORURINE YELLOW 07/31/2014 1658   APPEARANCEUR HAZY (A) 07/31/2014 1658   LABSPEC 1.016 07/31/2014 1658   PHURINE 5.0 07/31/2014 1658   GLUCOSEU 250 (A) 07/31/2014 1658   GLUCOSEU 100 (A) 01/04/2014 0907   HGBUR TRACE (A) 07/31/2014 1658   BILIRUBINUR NEGATIVE 07/31/2014 1658   KETONESUR NEGATIVE 07/31/2014 1658   PROTEINUR >300 (A) 07/31/2014 1658   UROBILINOGEN 0.2 07/31/2014 1658   NITRITE NEGATIVE 07/31/2014 1658   LEUKOCYTESUR NEGATIVE 07/31/2014 1658   Sepsis Labs Invalid input(s): PROCALCITONIN,  WBC,  LACTICIDVEN Microbiology No results found for this or any previous visit (from the past 240 hour(s)).   Time coordinating discharge: Over 30 minutes  SIGNED:   Debbe Odea, MD  [facility] 07/28/2017, 9:37 AM Pager   If 7PM-7AM, please contact night-coverage [email or web address] Password [email or web address]

## 2017-07-28 NOTE — Discharge Instructions (Signed)
Hold off on taking aspirin for 3 more days and then resume.   Please take all your medications with you for your next visit with your Primary MD. Please request your Primary MD to go over all hospital test results at the follow up. Please ask your Primary MD to get all Hospital records sent to his/her office.  If you experience worsening of your admission symptoms, develop shortness of breath, chest pain, suicidal or homicidal thoughts or a life threatening emergency, you must seek medical attention immediately by calling 911 or calling your MD.  Robert [name] must read the complete instructions/literature along with all the possible adverse reactions/side effects for all the medicines you take including new medications that have been prescribed to you. Take new medicines after you have completely understood and accpet all the possible adverse reactions/side effects.   Do not drive when taking pain medications or sedatives.    Do not take more than prescribed Pain, Sleep and Anxiety Medications  If you have smoked or chewed Tobacco in the last 2 yrs please stop. Stop any regular alcohol and or recreational drug use.  Wear Seat belts while driving.

## 2017-07-28 NOTE — Progress Notes (Signed)
[facility] Progress Note    Since last GI note: Slept well, no abd pains.  No BM in 36 hours now.  Interested in eating solids and going home if safe  Objective: Vital signs in last 24 hours: Temp:  [97.2 F (36.2 C)-98.3 F (36.8 C)] 98 F (36.7 C) (10/21 0546) Pulse Rate:  [65-70] 65 (10/21 0546) Resp:  [18-19] 18 (10/21 0546) BP: (120-132)/(57-101) 132/101 (10/21 0546) SpO2:  [97 %-99 %] 99 % (10/21 0546) Weight:  [276 lb 3.2 oz (125.3 kg)] 276 lb 3.2 oz (125.3 kg) (10/20 2050) Last BM Date: 07/26/17 General: alert and oriented times 3 Heart: regular rate and rythm Abdomen: soft, non-tender, non-distended, normal bowel sounds   Lab Results:  Recent Labs  07/27/17 0045 07/27/17 0512 07/28/17 0433  WBC 6.4 5.2 5.1  HGB 12.5* 11.8* 12.1*  PLT 246 206 200  MCV 89.9 89.4 89.4    Recent Labs  07/26/17 1105 07/27/17 0045  NA 136 134*  K 4.9 4.0  CL 108 106  CO2 21* 20*  GLUCOSE 177* 114*  BUN 26* 22*  CREATININE 1.67* 1.73*  CALCIUM 9.8 9.6    Recent Labs  07/26/17 1105  PROT 6.5  ALBUMIN 4.2  AST 14*  ALT 18  ALKPHOS 122  BILITOT 0.6    Recent Labs  07/27/17 0512  INR 1.05     Medications: Scheduled Meds: . amLODipine  10 mg Oral Daily  . dextromethorphan-guaiFENesin  1 tablet Oral BID  . DULoxetine  30 mg Oral Daily  . famotidine  40 mg Oral Daily  . gabapentin  200 mg Oral Daily  . gabapentin  300 mg Oral QHS  . insulin aspart  0-5 Units Subcutaneous QHS  . insulin aspart  0-9 Units Subcutaneous TID WC  . insulin glargine  16 Units Subcutaneous Daily  . levothyroxine  100 mcg Oral QAC breakfast  . metoprolol tartrate  50 mg Oral BID  . mycophenolate  1,000 mg Oral Q12H  . oseltamivir  30 mg Oral Daily  . [START ON 07/30/2017] pantoprazole  40 mg Intravenous Q12H  . rosuvastatin  10 mg Oral Daily  . [START ON 07/29/2017] sulfamethoxazole-trimethoprim  1 tablet Oral Q M,W,F  . Tacrolimus  5 mg Oral Daily  .  tamsulosin  0.8 mg Oral QHS  . valGANciclovir  900 mg Oral QAC supper   Continuous Infusions: PRN Meds:.acetaminophen **OR** acetaminophen, ondansetron **OR** ondansetron (ZOFRAN) IV, polyethylene glycol, traMADol, traZODone    Assessment/Plan: 56 y.o. male with lower GI bleed, minor  I presume the bleeding was diverticular, was fairly minor in volume and has clearly stopped. He understands his first 1-2 BMs will probably look unusual/dark as the old blood is evacuated.  I will advance his diet and if he is safe for d/c later this morning on all his usual medicines.  Please call or page with any further questions or concerns.   Colonoscopy 1 year ago does not need to be repeated now. [name], MD  07/28/2017, 8:04 AM  Gastroenterology Pager [phone]

## 2017-07-28 NOTE — Progress Notes (Signed)
Matilde Bash to be D/C'd Home per MD order.  Discussed prescriptions and follow up appointments with the patient. Prescriptions given to patient, medication list explained in detail. Pt verbalized understanding.  Allergies as of 07/28/2017   No Known Allergies     Medication List    STOP taking these medications   aspirin EC 81 MG tablet     TAKE these medications   acetaminophen 500 MG tablet Commonly known as:  TYLENOL Take 1,000 mg by mouth every 12 (twelve) hours as needed for mild pain.   amLODipine 5 MG tablet Commonly known as:  NORVASC Take 10 mg by mouth daily. Notes to patient:  07/29/17   dextromethorphan-guaiFENesin 30-600 MG 12hr tablet Commonly known as:  MUCINEX DM Take 1 tablet by mouth 2 (two) times daily.   DULoxetine 30 MG capsule Commonly known as:  CYMBALTA Take 30 mg by mouth daily.   ENVARSUS XR 1 MG Tb24 Generic drug:  Tacrolimus Take 1 mg by mouth daily.   fluocinonide cream 0.05 % Commonly known as:  LIDEX Apply 1 application topically daily as needed (Skin inflammation (hand, thigh, back)).   furosemide 20 MG tablet Commonly known as:  LASIX Take 40 mg by mouth daily.   gabapentin 100 MG capsule Commonly known as:  NEURONTIN Take 200-300 mg by mouth See admin instructions. 200 mg in the morning and 300 mg at bedtime   glucose blood test strip Commonly known as:  ONETOUCH VERIO Use to check blood sugars up to 6 times daily   HUMULIN R 500 UNIT/ML injection Generic drug:  insulin regular human CONCENTRATED Using a U-100 1cc/31G/5/16" INSULIN SYRINGE, draw up and inject 5-18 units into the skin three times a day 30 minutes prior to each meal   Insulin Pen Needle 31G X 5 MM Misc Commonly known as:  B-D UF III MINI PEN NEEDLES USE 4 PER DAY TO INJECT INSULIN.   Insulin Syringe-Needle U-100 31G X 5/16" 0.3 ML Misc Commonly known as:  BD INSULIN SYRINGE ULTRAFINE Use to inject insulin 2-3 times per day   levothyroxine 100 MCG  tablet Commonly known as:  SYNTHROID, LEVOTHROID Take 1 tablet (100 mcg total) by mouth daily.   metoprolol tartrate 25 MG tablet Commonly known as:  LOPRESSOR Take 50 mg by mouth 2 (two) times daily.   mycophenolate 250 MG capsule Commonly known as:  CELLCEPT Take 1,000 mg by mouth every 12 (twelve) hours.   NULOJIX 250 MG Solr injection Generic drug:  belatacept Inject 24.5 mLs into the vein every 28 (twenty-eight) days. Start taking on:  26/71/2458   [facility] DELICA LANCETS FINE Misc Use to check blood sugars 4-6 times daily   oseltamivir 30 MG capsule Commonly known as:  TAMIFLU Take 1 capsule (30 mg total) by mouth daily.   polyethylene glycol packet Commonly known as:  MIRALAX / GLYCOLAX Take 17 g by mouth daily as needed for mild constipation.   predniSONE 5 MG tablet Commonly known as:  DELTASONE Take 5 mg by mouth daily with breakfast.   ranitidine 150 MG tablet Commonly known as:  ZANTAC Take 150 mg by mouth at bedtime.   rosuvastatin 10 MG tablet Commonly known as:  CRESTOR Take 1 tablet (10 mg total) by mouth daily.   senna-docusate 8.6-50 MG tablet Commonly known as:  Senokot-S Take 1 tablet by mouth as needed for mild constipation.   sulfamethoxazole-trimethoprim 800-160 MG tablet Commonly known as:  BACTRIM DS,SEPTRA DS Take 1 tablet by mouth every  Monday, Wednesday, and Friday.   tamsulosin 0.4 MG Caps capsule Commonly known as:  FLOMAX Take 0.8 mg by mouth at bedtime.   traMADol 50 MG tablet Commonly known as:  ULTRAM Take 1 tablet (50 mg total) by mouth every 6 (six) hours as needed. What changed:  how much to take  reasons to take this   traZODone 50 MG tablet Commonly known as:  DESYREL Take 50-100 mg by mouth at bedtime as needed for sleep.   TRESIBA FLEXTOUCH 100 UNIT/ML Sopn FlexTouch Pen Generic drug:  insulin degludec INJECT 30 UNITS INTO THE SKIN EVERY MORNING What changed:  See the new instructions.   UNABLE TO  FIND BiPAP: At bedtime   valGANciclovir 450 MG tablet Commonly known as:  VALCYTE Take 900 mg by mouth daily.   zolpidem 10 MG tablet Commonly known as:  AMBIEN Take 10-20 mg by mouth at bedtime as needed for sleep.       Vitals:   07/28/17 0546 07/28/17 0934  BP: (!) 132/101 132/67  Pulse: 65 66  Resp: 18 18  Temp: 98 F (36.7 C) 98.3 F (36.8 C)  SpO2: 99% 99%    Skin clean, dry and intact without evidence of skin break down, no evidence of skin tears noted. IV catheter discontinued intact. Site without signs and symptoms of complications. Dressing and pressure applied. Pt denies pain at this time. No complaints noted.  An After Visit Summary was printed and given to the patient. Patient escorted via [place], and D/C home via private auto.  Emilio Math, RN [facility] Phone [phone]

## 2017-07-29 LAB — GLUCOSE, CAPILLARY: GLUCOSE-CAPILLARY: 105 mg/dL — AB (ref 65–99)

## 2017-07-29 NOTE — Progress Notes (Signed)

## 2017-07-30 ENCOUNTER — Other Ambulatory Visit: Payer: Self-pay | Admitting: Physician Assistant

## 2017-07-31 NOTE — Telephone Encounter (Signed)
What needs to be filled for this patients metoprolol, 25 or 50? Please advise.

## 2017-07-31 NOTE — Telephone Encounter (Signed)
Pt is taking 50 mg BID. Please send in for 50 mg tablets. Thanks.

## 2017-08-01 NOTE — Telephone Encounter (Signed)
Should the patient be taking lopressor or toprol xl? Med list has toprol xl, but pharmacy is requesting lopressor. Please advise. Thanks, MI

## 2017-08-02 ENCOUNTER — Other Ambulatory Visit: Payer: Self-pay

## 2017-08-02 DIAGNOSIS — Z940 Kidney transplant status: Secondary | ICD-10-CM | POA: Diagnosis not present

## 2017-08-05 NOTE — Telephone Encounter (Signed)
Rx sent to requested pharmacy

## 2017-08-07 ENCOUNTER — Encounter (HOSPITAL_BASED_OUTPATIENT_CLINIC_OR_DEPARTMENT_OTHER): Payer: Self-pay

## 2017-08-07 ENCOUNTER — Emergency Department (HOSPITAL_BASED_OUTPATIENT_CLINIC_OR_DEPARTMENT_OTHER)
Admission: EM | Admit: 2017-08-07 | Discharge: 2017-08-08 | Disposition: A | Discharge status: Other Acute Inpatient Hospital | Payer: Medicare Other | Attending: Emergency Medicine | Admitting: Emergency Medicine

## 2017-08-07 DIAGNOSIS — Z794 Long term (current) use of insulin: Secondary | ICD-10-CM | POA: Insufficient documentation

## 2017-08-07 DIAGNOSIS — N186 End stage renal disease: Secondary | ICD-10-CM | POA: Diagnosis not present

## 2017-08-07 DIAGNOSIS — Z940 Kidney transplant status: Secondary | ICD-10-CM | POA: Insufficient documentation

## 2017-08-07 DIAGNOSIS — R770 Abnormality of albumin: Secondary | ICD-10-CM | POA: Diagnosis not present

## 2017-08-07 DIAGNOSIS — Z87891 Personal history of nicotine dependence: Secondary | ICD-10-CM | POA: Diagnosis not present

## 2017-08-07 DIAGNOSIS — I129 Hypertensive chronic kidney disease with stage 1 through stage 4 chronic kidney disease, or unspecified chronic kidney disease: Secondary | ICD-10-CM | POA: Diagnosis not present

## 2017-08-07 DIAGNOSIS — R197 Diarrhea, unspecified: Secondary | ICD-10-CM

## 2017-08-07 DIAGNOSIS — E039 Hypothyroidism, unspecified: Secondary | ICD-10-CM | POA: Insufficient documentation

## 2017-08-07 DIAGNOSIS — Z79899 Other long term (current) drug therapy: Secondary | ICD-10-CM | POA: Diagnosis not present

## 2017-08-07 DIAGNOSIS — N189 Chronic kidney disease, unspecified: Secondary | ICD-10-CM

## 2017-08-07 DIAGNOSIS — N179 Acute kidney failure, unspecified: Secondary | ICD-10-CM | POA: Diagnosis not present

## 2017-08-07 DIAGNOSIS — I120 Hypertensive chronic kidney disease with stage 5 chronic kidney disease or end stage renal disease: Secondary | ICD-10-CM | POA: Insufficient documentation

## 2017-08-07 DIAGNOSIS — R7989 Other specified abnormal findings of blood chemistry: Secondary | ICD-10-CM | POA: Diagnosis not present

## 2017-08-07 DIAGNOSIS — E1122 Type 2 diabetes mellitus with diabetic chronic kidney disease: Secondary | ICD-10-CM | POA: Diagnosis not present

## 2017-08-07 DIAGNOSIS — R718 Other abnormality of red blood cells: Secondary | ICD-10-CM | POA: Diagnosis not present

## 2017-08-07 DIAGNOSIS — I10 Essential (primary) hypertension: Secondary | ICD-10-CM | POA: Diagnosis not present

## 2017-08-07 LAB — CBC WITH DIFFERENTIAL/PLATELET
Basophils Absolute: 0 10*3/uL (ref 0.0–0.1)
Basophils Relative: 0 %
Eosinophils Absolute: 0.1 10*3/uL (ref 0.0–0.7)
Eosinophils Relative: 1 %
HEMATOCRIT: 46.9 % (ref 39.0–52.0)
HEMOGLOBIN: 15.2 g/dL (ref 13.0–17.0)
LYMPHS ABS: 1.1 10*3/uL (ref 0.7–4.0)
LYMPHS PCT: 11 %
MCH: 28.8 pg (ref 26.0–34.0)
MCHC: 32.4 g/dL (ref 30.0–36.0)
MCV: 88.8 fL (ref 78.0–100.0)
Monocytes Absolute: 0.6 10*3/uL (ref 0.1–1.0)
Monocytes Relative: 5 %
NEUTROS PCT: 83 %
Neutro Abs: 8.6 10*3/uL — ABNORMAL HIGH (ref 1.7–7.7)
Platelets: 327 10*3/uL (ref 150–400)
RBC: 5.28 MIL/uL (ref 4.22–5.81)
RDW: 14.3 % (ref 11.5–15.5)
WBC: 10.4 10*3/uL (ref 4.0–10.5)

## 2017-08-07 LAB — COMPREHENSIVE METABOLIC PANEL
ALBUMIN: 5.3 g/dL — AB (ref 3.5–5.0)
ALK PHOS: 178 U/L — AB (ref 38–126)
ALT: 17 U/L (ref 17–63)
AST: 17 U/L (ref 15–41)
Anion gap: 11 (ref 5–15)
BUN: 34 mg/dL — AB (ref 6–20)
CALCIUM: 10.7 mg/dL — AB (ref 8.9–10.3)
CO2: 16 mmol/L — AB (ref 22–32)
CREATININE: 2.49 mg/dL — AB (ref 0.61–1.24)
Chloride: 107 mmol/L (ref 101–111)
GFR calc Af Amer: 31 mL/min — ABNORMAL LOW (ref >60)
GFR calc non Af Amer: 27 mL/min — ABNORMAL LOW (ref >60)
GLUCOSE: 251 mg/dL — AB (ref 65–99)
Potassium: 4.3 mmol/L (ref 3.5–5.1)
SODIUM: 134 mmol/L — AB (ref 135–145)
Total Bilirubin: 1 mg/dL (ref 0.3–1.2)
Total Protein: 8.2 g/dL — ABNORMAL HIGH (ref 6.5–8.1)

## 2017-08-07 LAB — LIPASE, BLOOD: Lipase: 30 U/L (ref 11–51)

## 2017-08-07 LAB — C DIFFICILE QUICK SCREEN W PCR REFLEX
C Diff antigen: NEGATIVE
C Diff interpretation: NOT DETECTED
C Diff toxin: NEGATIVE

## 2017-08-07 LAB — MAGNESIUM: Magnesium: 1.6 mg/dL — ABNORMAL LOW (ref 1.7–2.4)

## 2017-08-07 MED ORDER — SODIUM CHLORIDE 0.9 % IV BOLUS (SEPSIS)
1000.0000 mL | Freq: Once | INTRAVENOUS | Status: AC
  Administered 2017-08-07: 1000 mL via INTRAVENOUS

## 2017-08-07 MED ORDER — SODIUM CHLORIDE 0.9 % IV SOLN
INTRAVENOUS | Status: DC
  Administered 2017-08-07: 18:00:00 via INTRAVENOUS

## 2017-08-07 MED ORDER — ACETAMINOPHEN 325 MG PO TABS
650.0000 mg | ORAL_TABLET | Freq: Once | ORAL | Status: AC
  Administered 2017-08-07: 650 mg via ORAL
  Filled 2017-08-07: qty 2

## 2017-08-07 MED ORDER — HYDROCODONE-ACETAMINOPHEN 5-325 MG PO TABS
1.0000 | ORAL_TABLET | ORAL | 0 refills | Status: DC | PRN
Start: 2017-08-07 — End: 2017-08-07

## 2017-08-07 MED ORDER — ONDANSETRON 4 MG PO TBDP: ORAL_TABLET | ORAL | 0 refills | Status: DC

## 2017-08-07 MED ORDER — TAMSULOSIN HCL 0.4 MG PO CAPS: 0.4000 mg | ORAL_CAPSULE | Freq: Every day | ORAL | 0 refills | Status: DC

## 2017-08-07 NOTE — ED Triage Notes (Signed)
C/o n/v/d x 2 days-presents to triage in w/c

## 2017-08-07 NOTE — ED Notes (Signed)
ED Provider at bedside. 

## 2017-08-07 NOTE — ED Notes (Signed)
Called by reg clerk to assist pt in ED WR-arrived to find pt  A/O on both knees attempting to stand-pt stood/sat in w/c and taken into triage

## 2017-08-07 NOTE — ED Notes (Signed)
[facility] is here to pick up patient.

## 2017-08-07 NOTE — ED Provider Notes (Signed)
[facility] EMERGENCY DEPARTMENT Provider Note   CSN: [id number] Arrival date & time: 08/07/17  1305     History   Chief Complaint Chief Complaint  Patient presents with  . Diarrhea    HPI  Robert [name] is a 56 y.o. male With a past medical history of ESRD, status post lap left nephrectomy and right kidney transplant (May 2018), currently on multiple immunosuppressants, hypertension, and diabetes, who presents to the ED complaining of 2 days of loose watery diarrhea. Patient denies any blood in the stools. Patient reports no associated abdominal pain. Patient had one episode of vomiting once arriving the emergency department, but overall has not had any nausea and vomiting while at home. No fevers or chills. Patient denies pain, he just reports he is feeling very anxious about this. He does report a very mild headache, he says this is exactly like his previous headaches that he gets occasionally. Patient reports he's been in contact with his transplant coordinator, reports they were concerned about a virus such as CMV causing all this diarrhea, but he's been unable to get to [facility] to be seen. He reports he's followed by Dr. Azzie Glatter with the transplant service and is currently taking prednisone, CellCept, envarus, bela and prophylactic Bactrim.       Past Medical History:  Diagnosis Date  . Anemia    low hgb at present  . Arthritis    FEET  . Blood transfusion without reported diagnosis 1974   kidney removed - L  . Cancer ([place])    skin ca right shoulder, plastic dsyplasia, pre-Ca polpys removed on Colonoscopy- 07/2014  . Colon polyps 07/23/2014   Tubular adenomas x 5  . Constipation   . Diabetes mellitus without complication (HCC)    Type 2  . Difficult intubation    unsure of actual problem but it was during the January 28, 2015 procedure.  Marland Kitchen Dysrhythmia   . Eczema   . ESRD (end stage renal disease) ([place])    hEMO mwf  . Headache(784.0)    slight dull  h/a due kidney failure  . Hyperlipidemia   . Hypertension    SVT h/o , followed by Dr. Claiborne Billings  . Hypothyroidism   . Macular degeneration   . Neuropathy    right hand and both feet  . PAT (paroxysmal atrial tachycardia) ([place])   . PSVT (paroxysmal supraventricular tachycardia) ([place])   . SBO (small bowel obstruction) ([place])   . Shortness of breath    with exertion  . Sleep apnea 06/18/11   split-night sleep [facility]- [facility] BiPAP- 13-16  . Thyroid disease   . Umbilical hernia    will repair with thi surgery    Patient Active Problem List   Diagnosis Date Noted  . GI bleed 07/26/2017  . Kidney transplant status 07/26/2017  . Benign neoplasm of transverse colon   . Diverticulosis of colon without hemorrhage   . Heme positive stool   . Morbid obesity ([place]) 02/03/2016  . 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, with long-term current use of insulin ([place]) 07/14/2015  . Nausea & vomiting   . HTN (hypertension) 05/11/2015  . Chronic renal failure   . SBO (small bowel obstruction) ([place]) 05/04/2015  . Protein-calorie malnutrition, severe ([place]) 06/15/2014  . Uremia of renal origin 06/12/2014  . End stage renal disease on dialysis ([place]) 03/22/2014  . Fatigue 01/08/2014  . H/O unilateral nephrectomy 09/06/2013  .  Diabetes mellitus type 2 in obese ([place]) 09/06/2013  . SVT (supraventricular tachycardia) ([place]) 09/06/2013  . Lower extremity edema 09/06/2013  . Severe obstructive sleep apnea 09/06/2013  . Hypothyroidism 09/06/2013  . Hyperlipidemia with target LDL less than 70 09/06/2013    Past Surgical History:  Procedure Laterality Date  . AV FISTULA PLACEMENT Right 02/25/2015   Procedure: RIGHT ARTERIOVENOUS (AV) FISTULA CREATION;  Surgeon: Serafina Mitchell, MD;  Location: [facility];  Service: Vascular;  Laterality: Right;  . AV FISTULA PLACEMENT Right 09/15/2015   Procedure: ARTERIOVENOUS (AV) FISTULA CREATION- RIGHT  ARM;  Surgeon: Serafina Mitchell, MD;  Location: [place] [facility];  Service: Vascular;  Laterality: Right;  . CAPD INSERTION N/A 05/18/2014   Procedure: LAPAROSCOPIC INSERTION CONTINUOUS AMBULATORY PERITONEAL DIALYSIS  (CAPD) CATHETER;  Surgeon: Ralene Ok, MD;  Location: [place];  Service: General;  Laterality: N/A;  . COLONOSCOPY    . COLONOSCOPY WITH PROPOFOL N/A 07/12/2016   Procedure: COLONOSCOPY WITH PROPOFOL;  Surgeon: [name], MD;  Location: [facility];  Service: Endoscopy;  Laterality: N/A;  . declotting of fistula  08/2015  . ESOPHAGOGASTRODUODENOSCOPY (EGD) WITH PROPOFOL N/A 07/12/2016   Procedure: ESOPHAGOGASTRODUODENOSCOPY (EGD) WITH PROPOFOL;  Surgeon: [name], MD;  [facility];  Service: Endoscopy;  Laterality: N/A;  . FISTULA SUPERFICIALIZATION Right 02/07/2016   Procedure: RIIGHT UPPER ARM FISTULA SUPERFICIALIZATION;  Surgeon: Elam Dutch, MD;  Location: [place];  Service: Vascular;  Laterality: Right;  . IJ catheter insertion    . INSERTION OF DIALYSIS CATHETER Right 02/25/2015   Procedure: INSERTION OF RIGHT INTERNAL JUGULAR DIALYSIS CATHETER;  Surgeon: Serafina Mitchell, MD;  Location: [place];  Service: Vascular;  Laterality: Right;  . KIDNEY TRANSPLANT    . LAPAROSCOPIC REPOSITIONING CAPD CATHETER N/A 06/16/2014   Procedure: LAPAROSCOPIC REPOSITIONING CAPD CATHETER;  Surgeon: Ralene Ok, MD;  Location: [facility];  Service: General;  Laterality: N/A;  . LAPAROSCOPY N/A 05/04/2015   Procedure: LAPAROSCOPY DIAGNOSTIC LYSIS OF ADHESIONS;  Surgeon: Coralie Keens, MD;  Location: [facility];  Service: General;  Laterality: N/A;  . MINOR REMOVAL OF PERITONEAL DIALYSIS CATHETER N/A 04/28/2015   Procedure:  REMOVAL OF PERITONEAL DIALYSIS CATHETER;  Surgeon: Coralie Keens, MD;  Location: [facility];  Service: General;  Laterality: N/A;  . NEPHRECTOMY Left 1974  . RENAL BIOPSY Right 2012  . REVISON OF ARTERIOVENOUS FISTULA Right 05/26/2015   Procedure: SUPERFICIALIZATION OF  ARTERIOVENOUS FISTULA WITH SIDE BRANCH LIGATIONS;  Surgeon: Serafina Mitchell, MD;  Location: [facility];  Service: Vascular;  Laterality: Right;  . SKIN CANCER EXCISION     right shoulder  . SVT ABLATION N/A 11/23/2016   Procedure: SVT Ablation;  Surgeon: Will Meredith Leeds, MD;  Location: [facility];  Service: Cardiovascular;  Laterality: N/A;  . UMBILICAL HERNIA REPAIR N/A 05/18/2014   Procedure: HERNIA REPAIR UMBILICAL ADULT;  Surgeon: Ralene Ok, MD;  Location: [facility];  Service: General;  Laterality: N/A;  . UMBILICAL HERNIA REPAIR N/A 04/28/2015   Procedure: UMBILICAL HERNIA REPAIR WITH MESH;  Surgeon: Coralie Keens, MD;  Location: [place];  Service: General;  Laterality: N/A;       Home Medications    Prior to Admission medications   Medication Sig Start Date End Date Taking? Authorizing Provider  acetaminophen (TYLENOL) 500 MG tablet Take 1,000 mg by mouth every 12 (twelve) hours as needed for mild pain.    [provider]  amLODipine (NORVASC) 5 MG tablet Take 10 mg by mouth daily.  [provider]  belatacept (NULOJIX) 250 MG SOLR injection Inject 24.5 mLs into the vein every 28 (twenty-eight) days.  08/30/17   [provider]  dextromethorphan-guaiFENesin (MUCINEX DM) 30-600 MG 12hr tablet Take 1 tablet by mouth 2 (two) times daily.    [provider]  DULoxetine (CYMBALTA) 30 MG capsule Take 30 mg by mouth daily.  03/09/16   [provider]  fluocinonide cream (LIDEX) 3.71 % Apply 1 application topically daily as needed (Skin inflammation (hand, thigh, back)).     [provider]  furosemide (LASIX) 20 MG tablet Take 40 mg by mouth daily.     [provider]  gabapentin (NEURONTIN) 100 MG capsule Take 200-300 mg by mouth See admin instructions. 200 mg in the morning and 300 mg at bedtime 06/02/16   [provider]  glucose blood (ONETOUCH VERIO) test strip Use to check blood sugars up to 6 times daily  04/23/17   Elayne Snare, MD  HUMULIN R 500 UNIT/ML injection Using a U-100 1cc/31G/5/16" INSULIN SYRINGE, draw up and inject 5-18 units into the skin three times a day 30 minutes prior to each meal 05/30/17   Elayne Snare, MD  Insulin Pen Needle (B-D UF III MINI PEN NEEDLES) 31G X 5 MM MISC USE 4 PER DAY TO INJECT INSULIN. 04/23/17   Elayne Snare, MD  Insulin Syringe-Needle U-100 (BD INSULIN SYRINGE ULTRAFINE) 31G X 5/16" 0.3 ML MISC Use to inject insulin 2-3 times per day 04/23/17   Elayne Snare, MD  levothyroxine (SYNTHROID, LEVOTHROID) 100 MCG tablet Take 1 tablet (100 mcg total) by mouth daily. 04/23/17   Elayne Snare, MD  metoprolol tartrate (LOPRESSOR) 50 MG tablet TAKE 1 TABLET BY MOUTH TWICE A DAY 08/05/17   Troy Sine, MD  mycophenolate (CELLCEPT) 250 MG capsule Take 1,000 mg by mouth every 12 (twelve) hours.     [provider]  [facility] DELICA LANCETS FINE MISC Use to check blood sugars 4-6 times daily 04/23/17   Elayne Snare, MD  polyethylene glycol [facility] / Floria Raveling) packet Take 17 g by mouth daily as needed for mild constipation.     [provider]  predniSONE (DELTASONE) 5 MG tablet Take 5 mg by mouth daily with breakfast.    [provider]  ranitidine (ZANTAC) 150 MG tablet Take 150 mg by mouth at bedtime. 03/19/17   [provider]  rosuvastatin (CRESTOR) 10 MG tablet Take 1 tablet (10 mg total) by mouth daily. 04/23/17   Elayne Snare, MD  senna-docusate (SENOKOT-S) 8.6-50 MG tablet Take 1 tablet by mouth as needed for mild constipation.    [provider]  sulfamethoxazole-trimethoprim (BACTRIM DS,SEPTRA DS) 800-160 MG tablet Take 1 tablet by mouth every Monday, Wednesday, and Friday.     [provider]  Tacrolimus (ENVARSUS XR) 1 MG TB24 Take 1 mg by mouth daily.  07/09/17   [provider]  tamsulosin (FLOMAX) 0.4 MG CAPS capsule Take 0.8 mg by mouth at bedtime.     [provider]  traMADol (ULTRAM) 50 MG tablet  Take 1 tablet (50 mg total) by mouth every 6 (six) hours as needed. Patient taking differently: Take 50-100 mg by mouth every 6 (six) hours as needed (for pain).  02/07/16   Ulyses Amor, PA-C  traZODone (DESYREL) 50 MG tablet Take 50-100 mg by mouth at bedtime as needed for sleep.  08/18/16   [provider]  TRESIBA FLEXTOUCH 100 UNIT/ML SOPN FlexTouch Pen INJECT 30 UNITS INTO  THE SKIN EVERY MORNING Patient taking differently: INJECT 16 UNITS INTO THE SKIN EVERY MORNING 11/15/16   Elayne Snare, MD  UNABLE TO FIND BiPAP: At bedtime    [provider]  valGANciclovir (VALCYTE) 450 MG tablet Take 900 mg by mouth daily.     [provider]  zolpidem (AMBIEN) 10 MG tablet Take 10-20 mg by mouth at bedtime as needed for sleep.     [provider]    Family History Family History  Problem Relation Age of Onset  . Colon polyps Father   . Colon cancer Neg Hx   . Stomach cancer Neg Hx     Social History Social History  Substance Use Topics  . Smoking status: Former Smoker    Types: Cigars    Quit date: 12/06/2012  . Smokeless tobacco: Never Used  . Alcohol use Yes     Comment: once/month     Allergies   Patient has no known allergies.   Review of Systems Review of Systems  Constitutional: Negative for chills and fever.  HENT: Negative for congestion, rhinorrhea and sore throat.   Eyes: Negative for pain and visual disturbance.  Respiratory: Negative for cough, chest tightness and shortness of breath.   Cardiovascular: Negative for chest pain and palpitations.  Gastrointestinal: Positive for diarrhea and vomiting. Negative for abdominal pain, blood in stool and nausea.  Genitourinary: Negative for dysuria.  Musculoskeletal: Negative for myalgias.  Skin: Negative for pallor and rash.  Neurological: Positive for headaches. Negative for dizziness, syncope, weakness and numbness.     Physical Exam Updated Vital Signs BP 128/83 (BP Location: Right  Arm)   Pulse 90   Temp 98.4 F (36.9 C)   Resp (!) 22   SpO2 100%   Physical Exam  Constitutional: He is oriented to person, place, and time. He appears well-developed and well-nourished. No distress.  HENT:  Head: Normocephalic and atraumatic.  Mouth/Throat: Oropharynx is clear and moist.  Eyes: Pupils are equal, round, and reactive to light. EOM are normal. Right eye exhibits no discharge. Left eye exhibits no discharge.  Cardiovascular: Normal rate, regular rhythm, normal heart sounds and intact distal pulses.   Pulmonary/Chest: Effort normal and breath sounds normal. No respiratory distress. He has no wheezes. He has no rales.  Abdominal: Soft. He exhibits no distension and no mass. There is no tenderness. There is no guarding.  Bowel sounds hyperactive  Musculoskeletal: He exhibits no edema or deformity.  Neurological: He is alert and oriented to person, place, and time. Coordination normal.  Skin: Skin is warm and dry. Capillary refill takes less than 2 seconds. He is not diaphoretic.  Psychiatric: He has a normal mood and affect. His behavior is normal.  Nursing note and vitals reviewed.    ED Treatments / Results  Labs (all labs ordered are listed, but only abnormal results are displayed) Labs Reviewed  CBC WITH DIFFERENTIAL/PLATELET - Abnormal; Notable for the following:       Result Value   Neutro Abs 8.6 (*)    All other components within normal limits  COMPREHENSIVE METABOLIC PANEL - Abnormal; Notable for the following:    Sodium 134 (*)    CO2 16 (*)    Glucose, Bld 251 (*)    BUN 34 (*)    Creatinine, Ser 2.49 (*)    Calcium 10.7 (*)    Total Protein 8.2 (*)    Albumin 5.3 (*)    Alkaline Phosphatase 178 (*)    GFR  calc non Af Amer 27 (*)    GFR calc Af Amer 31 (*)    All other components within normal limits  MAGNESIUM - Abnormal; Notable for the following:    Magnesium 1.6 (*)    All other components within normal limits  GASTROINTESTINAL PANEL BY PCR,  STOOL (REPLACES STOOL CULTURE)  C DIFFICILE QUICK SCREEN W PCR REFLEX  LIPASE, BLOOD    EKG  EKG Interpretation None       Radiology No results found.  Procedures Procedures (including critical care time)  Medications Ordered in ED Medications  0.9 %  sodium chloride infusion (not administered)  acetaminophen (TYLENOL) tablet 650 mg (650 mg Oral Given 08/07/17 1422)  sodium chloride 0.9 % bolus 1,000 mL (1,000 mLs Intravenous New Bag/Given 08/07/17 1622)  sodium chloride 0.9 % bolus 1,000 mL (0 mLs Intravenous Stopped 08/07/17 1623)     Initial Impression / Assessment and Plan / ED Course  I have reviewed the triage vital signs and the nursing notes.  Pertinent labs & imaging results that were available during my care of the patient were reviewed by me and considered in my medical decision making (see chart for details).  Patient presents with 2 days of watery diarrhea, currently on immunosuppressive therapy status post renal transplant. Patient denies associated abdominal pain, fevers or chills. On exam, benign abdomen, patient appears well clinically and vital signs are normal, no clinical signs of dehydration.  Laboratory evaluation significant for increase in creatinine, from 1.7 to 2.49, this is likely due to fluid loss from diarrhea but is concerning given transplant kidney. No leukocytosis, slight increase in neutrophils. No hypokalemia. GI pathogen panel and C. Difficile PCR sent for stool analysis. IV fluids for rehydration.  Patient discussed with Dr. Regenia Skeeter. Patient will need to be admitted for AKI. Given patient's immunosuppressed status, and increase in creatinine with transplant, will contact patient's transplant team at [facility] for recommendations.    Spoke with Dr. Zannie Cove with the [facility] renal transplant service, he recommends transferring the patient to [facility] for admission for continued IV hydration, as well as continued infectious workup of diarrhea. Dr.  Koleen Nimrod accepts the patient for ED to inpatient transfer to [facility]. Discussed this with patient and wife, who are in agreement.  The patient was discussed with and seen by Dr. Regenia Skeeter who agrees with the treatment plan.  Final Clinical Impressions(s) / ED Diagnoses   Final diagnoses:  Diarrhea in adult patient  Acute kidney injury superimposed on chronic kidney disease [facility])    New Prescriptions Current Discharge Medication List       Janet Berlin 08/07/17 Marlis Edelson, MD 08/07/17 [phone]

## 2017-08-07 NOTE — ED Notes (Signed)
[facility] transport called for possible transportation - they will call back with an ETA

## 2017-08-08 DIAGNOSIS — I10 Essential (primary) hypertension: Secondary | ICD-10-CM | POA: Diagnosis present

## 2017-08-08 DIAGNOSIS — Z794 Long term (current) use of insulin: Secondary | ICD-10-CM | POA: Diagnosis not present

## 2017-08-08 DIAGNOSIS — R197 Diarrhea, unspecified: Secondary | ICD-10-CM | POA: Diagnosis present

## 2017-08-08 DIAGNOSIS — T8619 Other complication of kidney transplant: Secondary | ICD-10-CM | POA: Diagnosis present

## 2017-08-08 DIAGNOSIS — Z87891 Personal history of nicotine dependence: Secondary | ICD-10-CM | POA: Diagnosis not present

## 2017-08-08 DIAGNOSIS — N179 Acute kidney failure, unspecified: Secondary | ICD-10-CM

## 2017-08-08 DIAGNOSIS — Z298 Encounter for other specified prophylactic measures: Secondary | ICD-10-CM | POA: Diagnosis not present

## 2017-08-08 DIAGNOSIS — E039 Hypothyroidism, unspecified: Secondary | ICD-10-CM | POA: Diagnosis present

## 2017-08-08 DIAGNOSIS — E7849 Other hyperlipidemia: Secondary | ICD-10-CM | POA: Diagnosis present

## 2017-08-08 DIAGNOSIS — G4733 Obstructive sleep apnea (adult) (pediatric): Secondary | ICD-10-CM | POA: Diagnosis present

## 2017-08-08 DIAGNOSIS — Z940 Kidney transplant status: Secondary | ICD-10-CM | POA: Diagnosis not present

## 2017-08-08 DIAGNOSIS — Z6836 Body mass index (BMI) 36.0-36.9, adult: Secondary | ICD-10-CM | POA: Diagnosis not present

## 2017-08-08 DIAGNOSIS — D899 Disorder involving the immune mechanism, unspecified: Secondary | ICD-10-CM | POA: Diagnosis present

## 2017-08-08 DIAGNOSIS — E1140 Type 2 diabetes mellitus with diabetic neuropathy, unspecified: Secondary | ICD-10-CM | POA: Diagnosis present

## 2017-08-08 DIAGNOSIS — R634 Abnormal weight loss: Secondary | ICD-10-CM | POA: Diagnosis present

## 2017-08-08 DIAGNOSIS — E669 Obesity, unspecified: Secondary | ICD-10-CM | POA: Diagnosis present

## 2017-08-08 HISTORY — DX: Acute kidney failure, unspecified: N17.9

## 2017-08-08 LAB — GASTROINTESTINAL PANEL BY PCR, STOOL (REPLACES STOOL CULTURE)
Adenovirus F40/41: NOT DETECTED
Astrovirus: NOT DETECTED
CAMPYLOBACTER SPECIES: NOT DETECTED
CRYPTOSPORIDIUM: NOT DETECTED
CYCLOSPORA CAYETANENSIS: NOT DETECTED
Entamoeba histolytica: NOT DETECTED
Enteroaggregative E coli (EAEC): NOT DETECTED
Enteropathogenic E coli (EPEC): NOT DETECTED
Enterotoxigenic E coli (ETEC): NOT DETECTED
GIARDIA LAMBLIA: NOT DETECTED
Norovirus GI/GII: NOT DETECTED
PLESIMONAS SHIGELLOIDES: NOT DETECTED
ROTAVIRUS A: NOT DETECTED
SALMONELLA SPECIES: NOT DETECTED
SHIGELLA/ENTEROINVASIVE E COLI (EIEC): NOT DETECTED
Sapovirus (I, II, IV, and V): NOT DETECTED
Shiga like toxin producing E coli (STEC): NOT DETECTED
VIBRIO SPECIES: NOT DETECTED
Vibrio cholerae: NOT DETECTED
YERSINIA ENTEROCOLITICA: NOT DETECTED

## 2017-08-13 DIAGNOSIS — Z5181 Encounter for therapeutic drug level monitoring: Secondary | ICD-10-CM | POA: Diagnosis not present

## 2017-08-13 DIAGNOSIS — Z940 Kidney transplant status: Secondary | ICD-10-CM | POA: Diagnosis not present

## 2017-08-14 DIAGNOSIS — Z940 Kidney transplant status: Secondary | ICD-10-CM | POA: Diagnosis not present

## 2017-08-14 DIAGNOSIS — E039 Hypothyroidism, unspecified: Secondary | ICD-10-CM | POA: Diagnosis not present

## 2017-08-14 DIAGNOSIS — Z79899 Other long term (current) drug therapy: Secondary | ICD-10-CM | POA: Diagnosis not present

## 2017-08-14 DIAGNOSIS — Z6836 Body mass index (BMI) 36.0-36.9, adult: Secondary | ICD-10-CM | POA: Diagnosis not present

## 2017-08-14 DIAGNOSIS — E1122 Type 2 diabetes mellitus with diabetic chronic kidney disease: Secondary | ICD-10-CM | POA: Diagnosis not present

## 2017-08-14 DIAGNOSIS — I129 Hypertensive chronic kidney disease with stage 1 through stage 4 chronic kidney disease, or unspecified chronic kidney disease: Secondary | ICD-10-CM | POA: Diagnosis not present

## 2017-08-14 DIAGNOSIS — N189 Chronic kidney disease, unspecified: Secondary | ICD-10-CM | POA: Diagnosis not present

## 2017-08-14 DIAGNOSIS — Z85828 Personal history of other malignant neoplasm of skin: Secondary | ICD-10-CM | POA: Diagnosis not present

## 2017-08-14 DIAGNOSIS — I10 Essential (primary) hypertension: Secondary | ICD-10-CM | POA: Diagnosis not present

## 2017-08-14 DIAGNOSIS — K4090 Unilateral inguinal hernia, without obstruction or gangrene, not specified as recurrent: Secondary | ICD-10-CM | POA: Diagnosis not present

## 2017-08-16 DIAGNOSIS — Z9989 Dependence on other enabling machines and devices: Secondary | ICD-10-CM | POA: Diagnosis not present

## 2017-08-16 DIAGNOSIS — Z9889 Other specified postprocedural states: Secondary | ICD-10-CM | POA: Diagnosis not present

## 2017-08-16 DIAGNOSIS — E039 Hypothyroidism, unspecified: Secondary | ICD-10-CM | POA: Diagnosis not present

## 2017-08-16 DIAGNOSIS — E872 Acidosis: Secondary | ICD-10-CM | POA: Diagnosis not present

## 2017-08-16 DIAGNOSIS — Z5181 Encounter for therapeutic drug level monitoring: Secondary | ICD-10-CM | POA: Diagnosis not present

## 2017-08-16 DIAGNOSIS — Z4822 Encounter for aftercare following kidney transplant: Secondary | ICD-10-CM | POA: Diagnosis not present

## 2017-08-16 DIAGNOSIS — G4733 Obstructive sleep apnea (adult) (pediatric): Secondary | ICD-10-CM | POA: Diagnosis not present

## 2017-08-16 DIAGNOSIS — Z298 Encounter for other specified prophylactic measures: Secondary | ICD-10-CM | POA: Diagnosis not present

## 2017-08-16 DIAGNOSIS — E1140 Type 2 diabetes mellitus with diabetic neuropathy, unspecified: Secondary | ICD-10-CM | POA: Diagnosis not present

## 2017-08-16 DIAGNOSIS — D899 Disorder involving the immune mechanism, unspecified: Secondary | ICD-10-CM | POA: Diagnosis not present

## 2017-08-16 DIAGNOSIS — Z940 Kidney transplant status: Secondary | ICD-10-CM | POA: Diagnosis not present

## 2017-08-16 DIAGNOSIS — I10 Essential (primary) hypertension: Secondary | ICD-10-CM | POA: Diagnosis not present

## 2017-08-18 NOTE — Progress Notes (Signed)

## 2017-08-22 DIAGNOSIS — Z940 Kidney transplant status: Secondary | ICD-10-CM | POA: Diagnosis not present

## 2017-08-27 ENCOUNTER — Ambulatory Visit (INDEPENDENT_AMBULATORY_CARE_PROVIDER_SITE_OTHER): Payer: Medicare Other | Admitting: Podiatry

## 2017-08-27 ENCOUNTER — Telehealth: Payer: Self-pay | Admitting: Cardiovascular Disease

## 2017-08-27 ENCOUNTER — Encounter: Payer: Self-pay | Admitting: Podiatry

## 2017-08-27 DIAGNOSIS — B351 Tinea unguium: Secondary | ICD-10-CM | POA: Diagnosis not present

## 2017-08-27 DIAGNOSIS — M79674 Pain in right toe(s): Secondary | ICD-10-CM | POA: Diagnosis not present

## 2017-08-27 DIAGNOSIS — M79675 Pain in left toe(s): Secondary | ICD-10-CM

## 2017-08-27 DIAGNOSIS — E1149 Type 2 diabetes mellitus with other diabetic neurological complication: Secondary | ICD-10-CM

## 2017-08-27 NOTE — Progress Notes (Addendum)
Patient ID: Robert [name], male   DOB: [birth year], 56 y.o.   MRN: [id number] Complaint:  Visit Type: Patient returns to my office for continued preventative foot care services. Complaint: Patient states" my nails have grown long and thick and become painful to walk and wear shoes" Patient has been diagnosed with DM and is taking dialysis and neuropathy.  . The patient presents for preventative foot care services. No changes to ROS  Podiatric Exam: Vascular: dorsalis pedis and posterior tibial pulses are palpable bilateral. Capillary return is immediate. Temperature gradient is WNL. Skin turgor WNL  Sensorium: Diminished  Semmes Weinstein monofilament test. Normal tactile sensation bilaterally. Nail Exam: Pt has thick disfigured discolored nails with subungual debris noted bilateral entire nail hallux through fifth toenails Ulcer Exam: There is no evidence of ulcer or pre-ulcerative changes or infection. Orthopedic Exam: Muscle tone and strength are WNL. No limitations in general ROM. No crepitus or effusions noted. Foot type and digits show no abnormalities. Bony prominences are unremarkable. HAV  B/L Skin: No Porokeratosis. No infection or ulcers  Diagnosis:  Onychomycosis, , Pain in right toe, pain in left toes, Diabetic neuropathy  Hallux limitus 1st MPJ  B/L  Treatment & Plan Procedures and Treatment: Consent by patient was obtained for treatment procedures. The patient understood the discussion of treatment and procedures well. All questions were answered thoroughly reviewed. Debridement of mycotic and hypertrophic toenails, 1 through 5 bilateral and clearing of subungual debris. No ulceration, no infection noted. Discussed mentanx with patient. Return Visit-Office Procedure: Patient instructed to return to the office for a follow up visit 3 months for continued evaluation and treatment.    Gardiner Barefoot DPM

## 2017-08-27 NOTE — Telephone Encounter (Signed)
Spoke to patient, aware we cannot access his BiPAP download remotely-[facility] must link him to Dr. Claiborne Billings in order for [place] to obtain.    Patient verbalized understanding and will bring machine tomorrow to appt for download.

## 2017-08-27 NOTE — Telephone Encounter (Signed)
close

## 2017-08-27 NOTE — Telephone Encounter (Signed)
Patient calling, states that he would like to confirm that Dr.Kelly has access to his "respirator data" and wanted to make sure that it was wireless. I tried to connect call to you as he wanted to speak with you but he disconnected the call before I could see your response.

## 2017-08-28 ENCOUNTER — Encounter: Payer: Self-pay | Admitting: Cardiovascular Disease

## 2017-08-28 ENCOUNTER — Ambulatory Visit (INDEPENDENT_AMBULATORY_CARE_PROVIDER_SITE_OTHER): Payer: Medicare Other | Admitting: Cardiovascular Disease

## 2017-08-28 VITALS — BP 118/78 | HR 67 | Ht 71.0 in | Wt 279.2 lb

## 2017-08-28 DIAGNOSIS — E039 Hypothyroidism, unspecified: Secondary | ICD-10-CM | POA: Diagnosis not present

## 2017-08-28 DIAGNOSIS — G4733 Obstructive sleep apnea (adult) (pediatric): Secondary | ICD-10-CM

## 2017-08-28 DIAGNOSIS — I10 Essential (primary) hypertension: Secondary | ICD-10-CM | POA: Diagnosis not present

## 2017-08-28 DIAGNOSIS — E785 Hyperlipidemia, unspecified: Secondary | ICD-10-CM | POA: Diagnosis not present

## 2017-08-28 DIAGNOSIS — K5791 Diverticulosis of intestine, part unspecified, without perforation or abscess with bleeding: Secondary | ICD-10-CM | POA: Diagnosis not present

## 2017-08-28 DIAGNOSIS — Z9989 Dependence on other enabling machines and devices: Secondary | ICD-10-CM

## 2017-08-28 DIAGNOSIS — Z9889 Other specified postprocedural states: Secondary | ICD-10-CM | POA: Diagnosis not present

## 2017-08-28 DIAGNOSIS — Z940 Kidney transplant status: Secondary | ICD-10-CM

## 2017-08-28 DIAGNOSIS — Z8679 Personal history of other diseases of the circulatory system: Secondary | ICD-10-CM

## 2017-08-28 DIAGNOSIS — I350 Nonrheumatic aortic (valve) stenosis: Secondary | ICD-10-CM

## 2017-08-28 NOTE — Patient Instructions (Signed)
Medication Instructions:  Your physician recommends that you continue on your current medications as directed. Please refer to the Current Medication list given to you today.  Follow-Up: Your physician wants you to follow-up in: 6 months with Dr. Kelly. You will receive a reminder letter in the mail two months in advance. If you don't receive a letter, please call our office to schedule the follow-up appointment.   Any Other Special Instructions Will Be Listed Below (If Applicable).     If you need a refill on your cardiac medications before your next appointment, please call your pharmacy.   

## 2017-08-28 NOTE — Progress Notes (Signed)
Patient ID: Robert [name], male   DOB: 06/07/1961, 56 y.o.   MRN: [id number]     HPI: Robert [name], is a 56 y.o. male who is a former patient of Dr. Terance Ice. He presents for a 2 month follow-up evaluation.  Robert [name]  has a complex medical history that includes a congenital nonfunctioning left kidney for which he underwent left nephrectomy at age 27. He has a history of obesity, diabetes mellitus, and has been demonstrated to have proteinuria of his remaining single kidney, being status post open renal biopsy in [name] in November 2010. This revealed focal segmental glomerular sclerosis. At that time he was seen by Dr. Venita Sheffield in the nephrology department at [facility]. He sees Dr. Moshe Cipro in [place] for his kidney insufficiency and has been on ARB therapy in the past in an attempt to reduce proteinuria. He sees Dr. Dwyane Dee for his diabetes mellitus. He also has a history of hypothyroidism, hyperlipidemia, anxiety and depression. He has a history of prior supraventricular tachycardia requiring hospitalization in 2007 as was felt to be possibly AV nodal reentrant rhythm.  In addition, he has a history of complex sleep apnea initially diagnosed in 2007 with an AHI at that time of 56.7 per hour and during REM sleep this increased to 65.7 per hour. At that time he dropped his oxygen 84% and had loud snoring. He had been on BiPAP therapy. He underwent a 5 year followup split-night study in 2012 which again confirmed severe sleep apnea with an AHI of 60.7 on the baseline portion of the study. He was unable to achieve REM sleep at that time. Oxygen saturation during non-REM sleep at 83%. He was titrated utilizing BiPAP up to a setting of 16/12 cm water pressure. He does admit to continued residual daytime sleepiness despite using his BiPAP therapy.  An echo Doppler study in August 2012 showed mild concentric LVH with normal systolic function and  grade 1 diastolic dysfunction. He had trace mitral and tricuspid insufficiency as well as pulmonic insufficiency. A nuclear perfusion study in August 2012 showed normal perfusion.  [name], [name].  He is on the [facility] transplant list.  In January 2017 he had an stress echo at [facility] as part of his transplant evaluation.  I do not have the results of this evaluation.  He has had some difficulty with a steal syndrome in his right hand since he had AV fistulas inserted in the forearm and most recently now in the upper arm.  He continues to use BiPAP with 100% compliance.  He had a [name].  He obtains his CPAP supplies online because they are cheaper, but had seen [facility].  He is using a Art therapist full face mask.  A download in the office from 05/28/2016 through 07/26/2016.  He is meeting compliance standards with usage at 85% usage stays greater than 4 hours at 77%.  He is averaging 6 hours and 43 minutes per night.  He is set at an IPAP pressure of 16 and EPAP pressure of 12.  His AHI was mildly increased at 6.5 with 5.2 per hour of apneic events and 1.3 of hypopnea events.  When I last saw him he had stopped taken Bystolic since his blood pressure had become low at the end of dialysis.  He had been on 5 mg on nondialysis days.  He tells me his aspirin was discontinued  because he was continuously having issues with bleeding following his dialysis when the catheter was removed.   He had an episode of SVT in December 2017, which was successfully aborted with vagal maneuvers.  In the emergency room, he had 2-3 more recurrences of SVT, of which, again, all aborted with vagal maneuvers.  He was started on verapamil and discharge from the hospital.  He has seen Dr. Curt Bears and had an EP study with attempted ablation, but apparently he was not able to be induced and ablation was not performed.   He denies any recurrent episodes of SVT.  He  tells me that he is scheduled to undergo renal transplantation with a living donor at [facility] on 02/25/2017.  He denies any episodes of chest pain.  He denies shortness of breath.  Remotely, he may of had some mild nonexertional musculoskeletal discomfort but has not experienced any of this recently.  Reportedly, his remote stress echo was negative for ischemia, although mild ST changes were noted in recovery.  He continues to be asymptomatic.  He underwent a kidney transplant from a living donor on 02/25/2017 at [facility].  He tolerated surgery well but has developed a postoperative inguinal hernia, which will ultimately need to be surgically repaired.  He received a new BiPAP machine from [facility] 2 weeks ago.  He has a full face mask, but admits to increased leak.    Since I last saw him in September 2018, he had 2 hospital stays at [facility].  He developed intestinal bleeding due to diverticular disease and also had some dehydration issues.  He also underwent inguinal hernia surgery.  He denies any recent chest pain or palpitations.  He continues to use BiPAP with 100% compliance.  However, he did not have his machine with him for the days he was in the hospital.  He brought his machine with him today for interrogation.  Over the past month, usage hours and 6 hours and 32 minutes per night with an HI of 0.9.  He used to machine 27 of 30 nights nights that were not used was because he was in the hospital.  Usage greater than 4 hours was 26 of 30.  He is on a BiPAP pressure of 18/13 0.9.  Recently he has noticed a mild leak.  AHI was 5.6 over the past 7 days.  He has a ResMed fullface mask, Mirage Quatro mask.  He presents for evaluation.   Past Medical History:  Diagnosis Date  . Anemia    low hgb at present  . Arthritis    FEET  . Blood transfusion without reported diagnosis 1974   kidney removed - L  . Cancer ([place])    skin ca right shoulder, plastic dsyplasia, pre-Ca polpys removed on  Colonoscopy- 07/2014  . Colon polyps 07/23/2014   Tubular adenomas x 5  . Constipation   . Diabetes mellitus without complication (HCC)    Type 2  . Difficult intubation    unsure of actual problem but it was during the January 28, 2015 procedure.  Marland Kitchen Dysrhythmia   . Eczema   . ESRD (end stage renal disease) ([place])    hEMO mwf  . Headache(784.0)    slight dull h/a due kidney failure  . Hyperlipidemia   . Hypertension    SVT h/o , followed by Dr. Claiborne Billings  . Hypothyroidism   . Macular degeneration   . Neuropathy    right hand and both feet  . PAT (paroxysmal atrial  tachycardia) ([place])   . PSVT (paroxysmal supraventricular tachycardia) ([place])   . SBO (small bowel obstruction) ([place])   . Shortness of breath    with exertion  . Sleep apnea 06/18/11   split-night sleep study- [facility]., BiPAP- 13-16  . Thyroid disease   . Umbilical hernia    will repair with thi surgery    Past Surgical History:  Procedure Laterality Date  . AV FISTULA PLACEMENT Right 02/25/2015   Procedure: RIGHT ARTERIOVENOUS (AV) FISTULA CREATION;  Surgeon: Serafina Mitchell, MD;  Location: [place];  Service: Vascular;  Laterality: Right;  . AV FISTULA PLACEMENT Right 09/15/2015   Procedure: ARTERIOVENOUS (AV) FISTULA CREATION- RIGHT ARM;  Surgeon: Serafina Mitchell, MD;  Location: [place];  Service: Vascular;  Laterality: Right;  . CAPD INSERTION N/A 05/18/2014   Procedure: LAPAROSCOPIC INSERTION CONTINUOUS AMBULATORY PERITONEAL DIALYSIS  (CAPD) CATHETER;  Surgeon: Ralene Ok, MD;  Location: [place];  Service: General;  Laterality: N/A;  . COLONOSCOPY    . COLONOSCOPY WITH PROPOFOL N/A 07/12/2016   Procedure: COLONOSCOPY WITH PROPOFOL;  Surgeon: [name], MD;  Location: [facility];  Service: Endoscopy;  Laterality: N/A;  . declotting of fistula  08/2015  . ESOPHAGOGASTRODUODENOSCOPY (EGD) WITH PROPOFOL N/A 07/12/2016   Procedure: ESOPHAGOGASTRODUODENOSCOPY (EGD) WITH PROPOFOL;  Surgeon:  [name], MD;  [facility]: [facility];  Service: Endoscopy;  Laterality: N/A;  . FISTULA SUPERFICIALIZATION Right 02/07/2016   Procedure: RIIGHT UPPER ARM FISTULA SUPERFICIALIZATION;  Surgeon: Elam Dutch, MD;  Location: [place];  Service: Vascular;  Laterality: Right;  . IJ catheter insertion    . INSERTION OF DIALYSIS CATHETER Right 02/25/2015   Procedure: INSERTION OF RIGHT INTERNAL JUGULAR DIALYSIS CATHETER;  Surgeon: Serafina Mitchell, MD;  Location: [place];  Service: Vascular;  Laterality: Right;  . KIDNEY TRANSPLANT    . LAPAROSCOPIC REPOSITIONING CAPD CATHETER N/A 06/16/2014   Procedure: LAPAROSCOPIC REPOSITIONING CAPD CATHETER;  Surgeon: Ralene Ok, MD;  Location: [place];  Service: General;  Laterality: N/A;  . LAPAROSCOPY N/A 05/04/2015   Procedure: LAPAROSCOPY DIAGNOSTIC LYSIS OF ADHESIONS;  Surgeon: Coralie Keens, MD;  Location: [place];  Service: General;  Laterality: N/A;  . MINOR REMOVAL OF PERITONEAL DIALYSIS CATHETER N/A 04/28/2015   Procedure:  REMOVAL OF PERITONEAL DIALYSIS CATHETER;  Surgeon: Coralie Keens, MD;  Location: [place];  Service: General;  Laterality: N/A;  . NEPHRECTOMY Left 1974  . RENAL BIOPSY Right 2012  . REVISON OF ARTERIOVENOUS FISTULA Right 05/26/2015   Procedure: SUPERFICIALIZATION OF ARTERIOVENOUS FISTULA WITH SIDE BRANCH LIGATIONS;  Surgeon: Serafina Mitchell, MD;  Location: [place];  Service: Vascular;  Laterality: Right;  . SKIN CANCER EXCISION     right shoulder  . SVT ABLATION N/A 11/23/2016   Procedure: SVT Ablation;  Surgeon: Will Meredith Leeds, MD;  Location: [place] [facility];  Service: Cardiovascular;  Laterality: N/A;  . UMBILICAL HERNIA REPAIR N/A 05/18/2014   Procedure: HERNIA REPAIR UMBILICAL ADULT;  Surgeon: Ralene Ok, MD;  Location: [place];  Service: General;  Laterality: N/A;  . UMBILICAL HERNIA REPAIR N/A 04/28/2015   Procedure: UMBILICAL HERNIA REPAIR WITH MESH;  Surgeon: Coralie Keens, MD;  Location: [place];  Service:  General;  Laterality: N/A;    left nephrectomy in 1974.  No Known Allergies  Current Outpatient Medications  Medication Sig Dispense Refill  . acetaminophen (TYLENOL) 500 MG tablet Take 1,000 mg by mouth every 12 (twelve) hours as needed for mild pain.    Marland Kitchen  amLODipine (NORVASC) 5 MG tablet Take 10 mg by mouth daily.    . belatacept (NULOJIX) 250 MG SOLR injection Inject 24.5 mLs into the vein every 28 (twenty-eight) days.     Marland Kitchen dextromethorphan-guaiFENesin (MUCINEX DM) 30-600 MG 12hr tablet Take 1 tablet by mouth 2 (two) times daily.    . DULoxetine (CYMBALTA) 30 MG capsule Take 30 mg by mouth daily.     . fluocinonide cream (LIDEX) 3.71 % Apply 1 application topically daily as needed (Skin inflammation (hand, thigh, back)).     . furosemide (LASIX) 20 MG tablet Take 40 mg by mouth daily.     Marland Kitchen gabapentin (NEURONTIN) 100 MG capsule Take 200-300 mg by mouth See admin instructions. 200 mg in the morning and 300 mg at bedtime    . glucose blood (ONETOUCH VERIO) test strip Use to check blood sugars up to 6 times daily 600 each 1  . HUMULIN R 500 UNIT/ML injection Using a U-100 1cc/31G/5/16" INSULIN SYRINGE, draw up and inject 5-18 units into the skin three times a day 30 minutes prior to each meal 10 mL 1  . Insulin Pen Needle (B-D UF III MINI PEN NEEDLES) 31G X 5 MM MISC USE 4 PER DAY TO INJECT INSULIN. 400 each 1  . Insulin Syringe-Needle U-100 (BD INSULIN SYRINGE ULTRAFINE) 31G X 5/16" 0.3 ML MISC Use to inject insulin 2-3 times per day 300 each 1  . levothyroxine (SYNTHROID, LEVOTHROID) 100 MCG tablet Take 1 tablet (100 mcg total) by mouth daily. 90 tablet 1  . metoprolol tartrate (LOPRESSOR) 50 MG tablet TAKE 1 TABLET BY MOUTH TWICE A DAY 180 tablet 3  . mycophenolate (CELLCEPT) 250 MG capsule Take 1,000 mg by mouth every 12 (twelve) hours.     Glory Rosebush DELICA LANCETS FINE MISC Use to check blood sugars 4-6 times daily 400 each 1  . polyethylene glycol (MIRALAX / GLYCOLAX) packet Take 17 g  by mouth daily as needed for mild constipation.     . predniSONE (DELTASONE) 5 MG tablet Take 5 mg by mouth daily with breakfast.    . rosuvastatin (CRESTOR) 10 MG tablet Take 1 tablet (10 mg total) by mouth daily. 90 tablet 1  . senna-docusate (SENOKOT-S) 8.6-50 MG tablet Take 1 tablet by mouth as needed for mild constipation.    . sulfamethoxazole-trimethoprim (BACTRIM DS,SEPTRA DS) 800-160 MG tablet Take 1 tablet by mouth every Monday, Wednesday, and Friday.     . tamsulosin (FLOMAX) 0.4 MG CAPS capsule Take 0.8 mg by mouth at bedtime.     . traMADol (ULTRAM) 50 MG tablet Take 1 tablet (50 mg total) by mouth every 6 (six) hours as needed. (Patient taking differently: Take 50-100 mg by mouth every 6 (six) hours as needed (for pain). ) 15 tablet 0  . traZODone (DESYREL) 50 MG tablet Take 50-100 mg by mouth at bedtime as needed for sleep.     . TRESIBA FLEXTOUCH 100 UNIT/ML SOPN FlexTouch Pen INJECT 30 UNITS INTO THE SKIN EVERY MORNING (Patient taking differently: INJECT 16 UNITS INTO THE SKIN EVERY MORNING) 15 pen 1  . UNABLE TO FIND BiPAP: At bedtime    . zolpidem (AMBIEN) 10 MG tablet Take 10-20 mg by mouth at bedtime as needed for sleep.      No current facility-administered medications for this visit.    Social history is notable in that he is married for 29 years. His 3 children. He has a degree as a chemistry. 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 He does drink occasional alcohol. He has smoked cigars.  ROS General: Negative; No fevers, chills, or night sweats; positive for morbid obesity HEENT: Negative; No changes in vision or hearing, sinus congestion, difficulty swallowing Pulmonary: Negative; No cough, wheezing, shortness of breath, hemoptysis Cardiovascular: Negative; No chest pain, presyncope, syncope, palpitations GI: Negative; No nausea, vomiting, diarrhea, or abdominal pain GU: Negative; No dysuria, hematuria, or difficulty voiding Renal: no longer on dialysis, status  post renal transplant Musculoskeletal: Negative; no myalgias, joint pain, or weakness Hematologic/Oncology: Negative; no easy bruising, bleeding Endocrine: Positive for diabetes and hypothyroidism Neuro: Negative; no changes in balance, headaches Skin: Negative; No rashes or skin lesions Psychiatric: Negative; No behavioral problems, depression Sleep: Positive for OSA on BiPAP; no daytime sleepiness, hypersomnolence, bruxism, restless legs, hypnogognic hallucinations, no cataplexy Other comprehensive 14 point system review is negative.  PE BP 118/78   Pulse 67   Ht _0  (1.803 m)   Wt 279 lb 3.2 oz (126.6 kg)   BMI 38.94 kg/m    Repeat blood pressure by me was 126/76 , taken from his left arm.  He has an upper right arm fistula  Wt Readings from Last 3 Encounters:  08/28/17 279 lb 3.2 oz (126.6 kg)  07/27/17 276 lb 3.2 oz (125.3 kg)  07/16/17 279 lb 12.8 oz (126.9 kg)   General: Alert, oriented, no distress.  Skin: normal turgor, no rashes, warm and dry HEENT: Normocephalic, atraumatic. Pupils equal round and reactive to light; sclera anicteric; extraocular muscles intact Nose without nasal septal hypertrophy Mouth/Parynx benign; Mallinpatti scale 3 Neck: No JVD, no carotid bruits; normal carotid upstroke Lungs: clear to ausculatation and percussion; no wheezing or rales Chest wall: without tenderness to palpitation Heart: PMI not displaced, RRR, s1 s2 normal, 1/6 systolic murmur, no diastolic murmur, no rubs, gallops, thrills, or heaves Abdomen: Central abdominal adiposity; soft, nontender; no hepatosplenomehaly, BS+; abdominal aorta nontender and not dilated by palpation. Back: no CVA tenderness Pulses 2+ AV fistula in right upper arm with AV fistula sounds extending into the right subclavian artery Musculoskeletal: full range of motion, normal strength, no joint deformities Extremities: no clubbing cyanosis or edema, Homan's sign negative  Neurologic: grossly nonfocal;  Cranial nerves grossly wnl Psychologic: Normal mood and affect   ECG (independently read by me): normal sinus rhythm at 67 bpm.  Normal intervals.  No ectopy.  September 2018 ECG (independently read by me): normal sinus rhythm at 71 bpm.  No ST segment changes.  Normal intervals.  May 2018 ECG (independently read by me): Normal sinus rhythm at 89 bpm.  No STT changes .  PR interval 126 ms.  QTc interval 457 ms. Normal ECG.  October 2017 ECG (independently read by me): Sinus tachycardia 101 bpm.  April 2017 ECG (independently read by me): Sinus tachycardia 100 bpm.  2.  TC interval 45 ms.  November 2014 ECG: Normal sinus 34 beats per minute. PR interval 138 ms. QT interval 448 ms.  LABS: BMP Latest Ref Rng & Units 08/07/2017 07/27/2017 07/26/2017  Glucose 65 - 99 mg/dL 251(H) 114(H) 177(H)  BUN 6 - 20 mg/dL 34(H) 22(H) 26(H)  Creatinine 0.61 - 1.24 mg/dL 2.49(H) 1.73(H) 1.67(H)  BUN/Creat Ratio 9 - 20 - - -  Sodium 135 - 145 mmol/L 134(L) 134(L) 136  Potassium 3.5 - 5.1 mmol/L 4.3 4.0 4.9  Chloride 101 - 111 mmol/L 107 106 108  CO2 22 - 32 mmol/L 16(L) 20(L) 21(L)  Calcium 8.9 - 10.3 mg/dL 10.7(H)  9.6 9.8   Hepatic Function Latest Ref Rng & Units 08/07/2017 07/26/2017 11/13/2016  Total Protein 6.5 - 8.1 g/dL 8.2(H) 6.5 -  Albumin 3.5 - 5.0 g/dL 5.3(H) 4.2 -  AST 15 - 41 U/L 17 14(L) -  ALT 17 - 63 U/L _0 Alk Phosphatase 38 - 126 U/L 178(H) 122 -  Total Bilirubin 0.3 - 1.2 mg/dL 1.0 0.6 -    CBC Latest Ref Rng & Units 08/07/2017 07/28/2017 07/27/2017  WBC 4.0 - 10.5 K/uL 10.4 5.1 5.2  Hemoglobin 13.0 - 17.0 g/dL 15.2 12.1(L) 11.8(L)  Hematocrit 39.0 - 52.0 % 46.9 38.0(L) 36.3(L)  Platelets 150 - 400 K/uL 327 200 206    Lab Results  Component Value Date   MCV 88.8 08/07/2017   MCV 89.4 07/28/2017   MCV 89.4 07/27/2017   Lab Results  Component Value Date   TSH 1.30 05/28/2017   Lab Results  Component Value Date   HGBA1C 6.3 05/28/2017   Lipid Panel       Component Value Date/Time   CHOL 137 05/28/2017 1004   TRIG 71.0 05/28/2017 1004   HDL 49.60 05/28/2017 1004   CHOLHDL 3 05/28/2017 1004   VLDL 14.2 05/28/2017 1004   LDLCALC 73 05/28/2017 1004    RADIOLOGY: No results found.  IMPRESSION:  1. Essential hypertension   2. Mild aortic stenosis   3. OSA on BIPAP   4. Hyperlipidemia with target LDL less than 70   5. History of renal transplantation   6. S/P ablation operation for arrhythmia   7. Hypothyroidism, unspecified type   8. Diverticulosis of intestine with bleeding, unspecified intestinal tract location     ASSESSMENT AND PLAN: Mr. Rodin is a 56 year old Caucasian male who has a history of morbid obesity, hypertension, diabetes mellitus, hyperlipidemia, and end-stage kidney disease.  He is status post remote nephrectomy and 1974 and on 02/25/2017 after having been on hemodialysis he underwent successful renal transplantation from a living donor done at [facility] he had under gone.  Postoperative development of an inguinal hernia and since I last saw him, had undergone repair to that.  In addition, he also was hospitalized as result of possible diverticular bleeding.  When I last saw him, he had just received his new BiPAP machine.  I interrogated his BiPAP unit today and he is meeting Medicare compliance standards with average use on usage stays of 6.7 hours.  He admits to 100% compliance, but the days he did not use his machine was when he was hospitalized and did not have his machine with him.  He is currently on BiPAP, pressure 18/13 0.9.  He recently noticed some leaking.  We discussed mask adjustment and will follow this closely.  His blood pressure today is stable on current therapy.  He continues to be on amlodipine now at 10 mg in addition to metoprolol 50 g twice a day. He is not having palp.  He has hypothyroidism and is on levothyroxine 100 g daily, which is stable.  He continues to be on insulin for  his diabetes.  He is now on CellCept, Nulojix, and prednisone to prevent graft reject. Marland Kitchen  He continues to be on rosuvastatin for hyperlipidemia with target LDL less than 70.  He appears euvolemic.  He has mild aortic stenosis with mild-to-moderate aortic insufficiency, which was documented on his last echo in January 2018.  As long as he remains stable, I will see him in 6 months for  reevaluation.  Time spent: 25 minutes Troy Sine, MD, [facility]  08/30/2017 3:09 PM

## 2017-08-30 ENCOUNTER — Encounter: Payer: Self-pay | Admitting: Cardiovascular Disease

## 2017-09-02 ENCOUNTER — Encounter (HOSPITAL_COMMUNITY): Payer: BLUE CROSS/BLUE SHIELD

## 2017-09-04 DIAGNOSIS — Z940 Kidney transplant status: Secondary | ICD-10-CM | POA: Diagnosis not present

## 2017-09-04 DIAGNOSIS — E872 Acidosis: Secondary | ICD-10-CM | POA: Diagnosis not present

## 2017-09-04 DIAGNOSIS — Z9889 Other specified postprocedural states: Secondary | ICD-10-CM | POA: Diagnosis not present

## 2017-09-04 DIAGNOSIS — Z794 Long term (current) use of insulin: Secondary | ICD-10-CM | POA: Diagnosis not present

## 2017-09-04 DIAGNOSIS — Z48815 Encounter for surgical aftercare following surgery on the digestive system: Secondary | ICD-10-CM | POA: Diagnosis not present

## 2017-09-12 ENCOUNTER — Other Ambulatory Visit (HOSPITAL_COMMUNITY): Payer: Self-pay

## 2017-09-13 ENCOUNTER — Ambulatory Visit (HOSPITAL_COMMUNITY)
Admission: RE | Admit: 2017-09-13 | Discharge: 2017-09-13 | Disposition: A | Discharge status: Home / Self Care | Payer: Medicare Other | Source: Ambulatory Visit | Attending: Internal Medicine | Admitting: Internal Medicine

## 2017-09-13 DIAGNOSIS — Z940 Kidney transplant status: Secondary | ICD-10-CM | POA: Diagnosis not present

## 2017-09-13 LAB — URINALYSIS, ROUTINE W REFLEX MICROSCOPIC
BILIRUBIN URINE: NEGATIVE
Bacteria, UA: NONE SEEN
GLUCOSE, UA: 150 mg/dL — AB
Ketones, ur: NEGATIVE mg/dL
Leukocytes, UA: NEGATIVE
NITRITE: NEGATIVE
PROTEIN: 100 mg/dL — AB
Specific Gravity, Urine: 1.017 (ref 1.005–1.030)
Squamous Epithelial / LPF: NONE SEEN
pH: 5 (ref 5.0–8.0)

## 2017-09-13 LAB — BASIC METABOLIC PANEL
ANION GAP: 8 (ref 5–15)
BUN: 27 mg/dL — ABNORMAL HIGH (ref 6–20)
CHLORIDE: 110 mmol/L (ref 101–111)
CO2: 23 mmol/L (ref 22–32)
Calcium: 9.6 mg/dL (ref 8.9–10.3)
Creatinine, Ser: 1.53 mg/dL — ABNORMAL HIGH (ref 0.61–1.24)
GFR calc non Af Amer: 48 mL/min — ABNORMAL LOW (ref >60)
GFR, EST AFRICAN AMERICAN: 56 mL/min — AB (ref >60)
Glucose, Bld: 224 mg/dL — ABNORMAL HIGH (ref 65–99)
POTASSIUM: 3.9 mmol/L (ref 3.5–5.1)
Sodium: 141 mmol/L (ref 135–145)

## 2017-09-13 LAB — CBC
HEMATOCRIT: 38.5 % — AB (ref 39.0–52.0)
HEMOGLOBIN: 12.4 g/dL — AB (ref 13.0–17.0)
MCH: 28.8 pg (ref 26.0–34.0)
MCHC: 32.2 g/dL (ref 30.0–36.0)
MCV: 89.5 fL (ref 78.0–100.0)
Platelets: 238 10*3/uL (ref 150–400)
RBC: 4.3 MIL/uL (ref 4.22–5.81)
RDW: 14.4 % (ref 11.5–15.5)
WBC: 6.2 10*3/uL (ref 4.0–10.5)

## 2017-09-13 LAB — PROTEIN / CREATININE RATIO, URINE
Creatinine, Urine: 78.63 mg/dL
PROTEIN CREATININE RATIO: 1.27 mg/mg{creat} — AB (ref 0.00–0.15)
Total Protein, Urine: 100 mg/dL

## 2017-09-13 MED ORDER — SODIUM CHLORIDE 0.9 % IV SOLN
612.5000 mg | INTRAVENOUS | Status: DC
  Administered 2017-09-13: 612.5 mg via INTRAVENOUS
  Filled 2017-09-13: qty 612.5

## 2017-09-17 ENCOUNTER — Other Ambulatory Visit: Payer: Medicare Other

## 2017-09-17 LAB — BK QUANT PCR (PLASMA/SERUM)
BK QUANTITATION PCR: POSITIVE {copies}/mL
Log10 BK Qn PCR: UNDETERMINED log10copy/mL

## 2017-09-18 LAB — CMV DNA, QUANTITATIVE, PCR
CMV DNA Quant: NEGATIVE IU/mL
Log10 CMV Qn DNA Pl: UNDETERMINED log10 IU/mL

## 2017-09-19 ENCOUNTER — Other Ambulatory Visit (INDEPENDENT_AMBULATORY_CARE_PROVIDER_SITE_OTHER): Payer: Medicare Other

## 2017-09-19 DIAGNOSIS — E1165 Type 2 diabetes mellitus with hyperglycemia: Secondary | ICD-10-CM

## 2017-09-19 DIAGNOSIS — E063 Autoimmune thyroiditis: Secondary | ICD-10-CM | POA: Diagnosis not present

## 2017-09-19 DIAGNOSIS — E291 Testicular hypofunction: Secondary | ICD-10-CM

## 2017-09-19 DIAGNOSIS — Z794 Long term (current) use of insulin: Secondary | ICD-10-CM

## 2017-09-19 LAB — TSH: TSH: 2.92 u[IU]/mL (ref 0.35–4.50)

## 2017-09-19 LAB — HEMOGLOBIN A1C: Hgb A1c MFr Bld: 7 % — ABNORMAL HIGH (ref 4.6–6.5)

## 2017-09-19 LAB — TESTOSTERONE: TESTOSTERONE: 239.62 ng/dL — AB (ref 300.00–890.00)

## 2017-09-20 ENCOUNTER — Ambulatory Visit: Payer: Medicare Other | Admitting: Endocrinology

## 2017-09-22 LAB — FRUCTOSAMINE: Fructosamine: 279 umol/L — ABNORMAL HIGH (ref 190–270)

## 2017-09-23 ENCOUNTER — Ambulatory Visit: Payer: Medicare Other | Admitting: Endocrinology

## 2017-09-23 DIAGNOSIS — E039 Hypothyroidism, unspecified: Secondary | ICD-10-CM | POA: Diagnosis not present

## 2017-09-23 DIAGNOSIS — Z940 Kidney transplant status: Secondary | ICD-10-CM | POA: Diagnosis not present

## 2017-09-23 DIAGNOSIS — Z79899 Other long term (current) drug therapy: Secondary | ICD-10-CM | POA: Diagnosis not present

## 2017-09-23 DIAGNOSIS — E1140 Type 2 diabetes mellitus with diabetic neuropathy, unspecified: Secondary | ICD-10-CM | POA: Diagnosis not present

## 2017-09-23 DIAGNOSIS — D899 Disorder involving the immune mechanism, unspecified: Secondary | ICD-10-CM | POA: Diagnosis not present

## 2017-09-23 DIAGNOSIS — Z23 Encounter for immunization: Secondary | ICD-10-CM | POA: Diagnosis not present

## 2017-09-23 DIAGNOSIS — G4733 Obstructive sleep apnea (adult) (pediatric): Secondary | ICD-10-CM | POA: Diagnosis not present

## 2017-09-23 DIAGNOSIS — I10 Essential (primary) hypertension: Secondary | ICD-10-CM | POA: Diagnosis not present

## 2017-09-24 ENCOUNTER — Ambulatory Visit: Payer: Medicare Other | Admitting: Endocrinology

## 2017-10-04 ENCOUNTER — Ambulatory Visit (INDEPENDENT_AMBULATORY_CARE_PROVIDER_SITE_OTHER): Payer: Medicare Other | Admitting: Endocrinology

## 2017-10-04 ENCOUNTER — Encounter: Payer: Self-pay | Admitting: Endocrinology

## 2017-10-04 VITALS — BP 134/82 | HR 67 | Ht 71.0 in | Wt 294.0 lb

## 2017-10-04 DIAGNOSIS — E291 Testicular hypofunction: Secondary | ICD-10-CM | POA: Diagnosis not present

## 2017-10-04 DIAGNOSIS — Z794 Long term (current) use of insulin: Secondary | ICD-10-CM | POA: Diagnosis not present

## 2017-10-04 DIAGNOSIS — E1165 Type 2 diabetes mellitus with hyperglycemia: Secondary | ICD-10-CM

## 2017-10-04 DIAGNOSIS — I10 Essential (primary) hypertension: Secondary | ICD-10-CM

## 2017-10-04 MED ORDER — TESTOSTERONE 20.25 MG/ACT (1.62%) TD GEL: TRANSDERMAL | 2 refills | Status: DC

## 2017-10-04 NOTE — Patient Instructions (Signed)
Set alarm to take insulin before the meal  Take insulin based on CARBS, divide grams by 5  Check blood sugars on waking up  3/7  Also check blood sugars about 2 hours after a meal and do this after different meals by rotation  Recommended blood sugar levels on waking up is 90-130 and about 2 hours after meal is 130-180  Please bring your blood sugar monitor to each visit, thank you

## 2017-10-04 NOTE — Progress Notes (Signed)
Patient ID: Robert [name], male   DOB: 1960/12/13, 56 y.o.   MRN: [id number]   Reason for Appointment: Type II Diabetes follow-up   History of Present Illness   Diagnosis date:  2012  Previous history: His blood sugar has been difficult to control since onset. Initially was taking oral hypoglycemic drugs like glyburide but could not take metformin because of renal dysfunction. Since Victoza did not help his control he was started on Lantus and then U 500 insulin also His A1c has previously ranged from 7.8-9.8, the lowest level in 03/2013 He was on Victoza but because of his markedly decreased appetite and weight loss this was stopped in 9/15  Recent history:   Insulin regimen: Tresiba 16 units In a.m., Humulin U-500 as needed  His A1c is usually lower than expected for his blood sugars and is progressively increasing, previously as low as 5.8 and now 7.0 Fructosamine is about the same  Current blood sugar patterns, management and problems identified:  On his last visit he was explained the need to take his mealtime coverage BEFORE eating consistently which she used to do previously  However he did not apparently understand instructions or remember to do this and is still taking the Humulin R as needed when the blood sugars are high  He takes between 3-15 units at a time  With this his POSTPRANDIAL readings are very consistently high, highest 334  Also his blood sugars compared to last visit are higher and also variable   Blood sugars are the highest before suppertime and slightly better later when he takes his correction doses  FASTING blood sugars are quite variable based on his taking the Humulin R and as low as 70 overnight  He was told to keep reminder on his phone to check his blood sugar and take insulin before eating but he forgot to do this.    Also he has not been consistent with his diet over the last couple of weeks, may be getting variable amounts of  carbohydrate or fat and highest carbohydrate intake is at dinnertime  Currently is not motivated to count carbohydrates as before He has gained a significant amount of weight  Still does not do any exercise  Has not checked sugars as much and less than before, previously doing at least 3 times a day  Proper timing of medications in relation to meals: Yes.          Monitors blood glucose:  recently 2.1 times a day .    Glucometer: One [employer].          Blood Glucose readings by download  Mean values apply above for all meters except median for One Touch  PRE-MEAL Fasting Lunch Dinner Bedtime Overall  Glucose range: 70-242  178-321  199-334  1 24-310    Mean/median: 127   251  190  195 +/-70     Meals: 2-3 meals per day. 7-8 am and Noon and Dinner 6-7 PM .        Physical activity: exercise: not walking        Weight control:  Wt Readings from Last 3 Encounters:  10/04/17 294 lb (133.4 kg)  09/13/17 273 lb 2 oz (123.9 kg)  08/28/17 279 lb 3.2 oz (009.3 kg)        Complications:   nephropathy, neuropathy   Diabetes labs:   Lab Results  Component Value Date   HGBA1C 7.0 (H) 09/19/2017   HGBA1C 6.3 05/28/2017  HGBA1C 5.8 11/13/2016   Lab Results  Component Value Date   MICROALBUR 224.5 Repeated and verified X2. (H) 01/04/2014   LDLCALC 73 05/28/2017   CREATININE 1.53 (H) 09/13/2017    Lab Results  Component Value Date   FRUCTOSAMINE 279 (H) 09/19/2017   FRUCTOSAMINE 277 05/28/2017   FRUCTOSAMINE 250 04/17/2017     No visits with results within 1 Week(s) from this visit.  Latest known visit with results is:  Lab on 09/19/2017  Component Date Value Ref Range Status  . TSH 09/19/2017 2.92  0.35 - 4.50 uIU/mL Final  . Testosterone 09/19/2017 239.62* 300.00 - 890.00 ng/dL Final  . Fructosamine 09/19/2017 279* 190 - 270 umol/L Final  . Hgb A1c MFr Bld 09/19/2017 7.0* 4.6 - 6.5 % Final   Glycemic Control Guidelines for People with Diabetes:Non Diabetic:   <6%Goal of Therapy: <7%Additional Action Suggested:  >8%     OTHER problems: See review of systems    Allergies as of 10/04/2017   No Known Allergies     Medication List        Accurate as of 10/04/17  3:58 PM. Always use your most recent med list.          acetaminophen 500 MG tablet Commonly known as:  TYLENOL Take 1,000 mg by mouth every 12 (twelve) hours as needed for mild pain.   amLODipine 5 MG tablet Commonly known as:  NORVASC Take 10 mg by mouth daily.   dextromethorphan-guaiFENesin 30-600 MG 12hr tablet Commonly known as:  MUCINEX DM Take 1 tablet by mouth 2 (two) times daily.   DULoxetine 30 MG capsule Commonly known as:  CYMBALTA Take 30 mg by mouth daily.   fluocinonide cream 0.05 % Commonly known as:  LIDEX Apply 1 application topically daily as needed (Skin inflammation (hand, thigh, back)).   furosemide 20 MG tablet Commonly known as:  LASIX Take 40 mg by mouth daily.   gabapentin 100 MG capsule Commonly known as:  NEURONTIN Take 200-300 mg by mouth See admin instructions. 200 mg in the morning and 300 mg at bedtime   glucose blood test strip Commonly known as:  ONETOUCH VERIO Use to check blood sugars up to 6 times daily   HUMULIN R 500 UNIT/ML injection Generic drug:  insulin regular human CONCENTRATED Using a U-100 1cc/31G/5/16" INSULIN SYRINGE, draw up and inject 5-18 units into the skin three times a day 30 minutes prior to each meal   Insulin Pen Needle 31G X 5 MM Misc Commonly known as:  B-D UF III MINI PEN NEEDLES USE 4 PER DAY TO INJECT INSULIN.   Insulin Syringe-Needle U-100 31G X 5/16" 0.3 ML Misc Commonly known as:  BD INSULIN SYRINGE U/F Use to inject insulin 2-3 times per day   levothyroxine 100 MCG tablet Commonly known as:  SYNTHROID, LEVOTHROID Take 1 tablet (100 mcg total) by mouth daily.   metoprolol tartrate 50 MG tablet Commonly known as:  LOPRESSOR TAKE 1 TABLET BY MOUTH TWICE A DAY   mycophenolate 250 MG  capsule Commonly known as:  CELLCEPT Take 1,000 mg by mouth every 12 (twelve) hours.   NULOJIX 250 MG Solr injection Generic drug:  belatacept Inject 24.5 mLs into the vein every 28 (twenty-eight) days.   ONETOUCH DELICA LANCETS FINE Misc Use to check blood sugars 4-6 times daily   polyethylene glycol packet Commonly known as:  MIRALAX / GLYCOLAX Take 17 g by mouth daily as needed for mild constipation.   predniSONE 5 MG  tablet Commonly known as:  DELTASONE Take 5 mg by mouth daily with breakfast.   rosuvastatin 10 MG tablet Commonly known as:  CRESTOR Take 1 tablet (10 mg total) by mouth daily.   senna-docusate 8.6-50 MG tablet Commonly known as:  Senokot-S Take 1 tablet by mouth as needed for mild constipation.   sulfamethoxazole-trimethoprim 800-160 MG tablet Commonly known as:  BACTRIM DS,SEPTRA DS Take 1 tablet by mouth every Monday, Wednesday, and Friday.   tamsulosin 0.4 MG Caps capsule Commonly known as:  FLOMAX Take 0.8 mg by mouth at bedtime.   traMADol 50 MG tablet Commonly known as:  ULTRAM Take 1 tablet (50 mg total) by mouth every 6 (six) hours as needed.   traZODone 50 MG tablet Commonly known as:  DESYREL Take 50-100 mg by mouth at bedtime as needed for sleep.   TRESIBA FLEXTOUCH 100 UNIT/ML Sopn FlexTouch Pen Generic drug:  insulin degludec INJECT 30 UNITS INTO THE SKIN EVERY MORNING   UNABLE TO FIND BiPAP: At bedtime   zolpidem 10 MG tablet Commonly known as:  AMBIEN Take 10-20 mg by mouth at bedtime as needed for sleep.       Allergies: No Known Allergies  Past Medical History:  Diagnosis Date  . Anemia    low hgb at present  . Arthritis    FEET  . Blood transfusion without reported diagnosis 1974   kidney removed - L  . Cancer ([place])    skin ca right shoulder, plastic dsyplasia, pre-Ca polpys removed on Colonoscopy- 07/2014  . Colon polyps 07/23/2014   Tubular adenomas x 5  . Constipation   . Diabetes mellitus without  complication ([facility])    Type 2  . Difficult intubation    unsure of actual problem but it was during the January 28, 2015 procedure.  Marland Kitchen Dysrhythmia   . Eczema   . ESRD (end stage renal disease) ([place])    hEMO mwf  . Headache(784.0)    slight dull h/a due kidney failure  . Hyperlipidemia   . Hypertension    SVT h/o , followed by Dr. Claiborne Billings  . Hypothyroidism   . Macular degeneration   . Neuropathy    right hand and both feet  . PAT (paroxysmal atrial tachycardia) ([place])   . PSVT (paroxysmal supraventricular tachycardia) ([place])   . SBO (small bowel obstruction) ([place])   . Shortness of breath    with exertion  . Sleep apnea 06/18/11   split-night sleep study- [facility]., BiPAP- 13-16  . Thyroid disease   . Umbilical hernia    will repair with thi surgery    Past Surgical History:  Procedure Laterality Date  . AV FISTULA PLACEMENT Right 02/25/2015   Procedure: RIGHT ARTERIOVENOUS (AV) FISTULA CREATION;  Surgeon: Serafina Mitchell, MD;  Location: [facility];  Service: Vascular;  Laterality: Right;  . AV FISTULA PLACEMENT Right 09/15/2015   Procedure: ARTERIOVENOUS (AV) FISTULA CREATION- RIGHT ARM;  Surgeon: Serafina Mitchell, MD;  Location: [facility];  Service: Vascular;  Laterality: Right;  . CAPD INSERTION N/A 05/18/2014   Procedure: LAPAROSCOPIC INSERTION CONTINUOUS AMBULATORY PERITONEAL DIALYSIS  (CAPD) CATHETER;  Surgeon: Ralene Ok, MD;  Location: [facility];  Service: General;  Laterality: N/A;  . COLONOSCOPY    . COLONOSCOPY WITH PROPOFOL N/A 07/12/2016   Procedure: COLONOSCOPY WITH PROPOFOL;  Surgeon: [name], MD;  Location: [facility];  Service: Endoscopy;  Laterality: N/A;  . declotting of fistula  08/2015  . ESOPHAGOGASTRODUODENOSCOPY (EGD)  WITH PROPOFOL N/A 07/12/2016   Procedure: ESOPHAGOGASTRODUODENOSCOPY (EGD) WITH PROPOFOL;  Surgeon: [name], MD;  [facility];  Service: Endoscopy;  Laterality: N/A;  . FISTULA SUPERFICIALIZATION Right  02/07/2016   Procedure: RIIGHT UPPER ARM FISTULA SUPERFICIALIZATION;  Surgeon: Elam Dutch, MD;  Location: [facility];  Service: Vascular;  Laterality: Right;  . IJ catheter insertion    . INSERTION OF DIALYSIS CATHETER Right 02/25/2015   Procedure: INSERTION OF RIGHT INTERNAL JUGULAR DIALYSIS CATHETER;  Surgeon: Serafina Mitchell, MD;  Location: [facility];  Service: Vascular;  Laterality: Right;  . KIDNEY TRANSPLANT    . LAPAROSCOPIC REPOSITIONING CAPD CATHETER N/A 06/16/2014   Procedure: LAPAROSCOPIC REPOSITIONING CAPD CATHETER;  Surgeon: Ralene Ok, MD;  Location: [facility];  Service: General;  Laterality: N/A;  . LAPAROSCOPY N/A 05/04/2015   Procedure: LAPAROSCOPY DIAGNOSTIC LYSIS OF ADHESIONS;  Surgeon: Coralie Keens, MD;  Location: [facility];  Service: General;  Laterality: N/A;  . MINOR REMOVAL OF PERITONEAL DIALYSIS CATHETER N/A 04/28/2015   Procedure:  REMOVAL OF PERITONEAL DIALYSIS CATHETER;  Surgeon: Coralie Keens, MD;  Location: [facility];  Service: General;  Laterality: N/A;  . NEPHRECTOMY Left 1974  . RENAL BIOPSY Right 2012  . REVISON OF ARTERIOVENOUS FISTULA Right 05/26/2015   Procedure: SUPERFICIALIZATION OF ARTERIOVENOUS FISTULA WITH SIDE BRANCH LIGATIONS;  Surgeon: Serafina Mitchell, MD;  Location: [facility];  Service: Vascular;  Laterality: Right;  . SKIN CANCER EXCISION     right shoulder  . SVT ABLATION N/A 11/23/2016   Procedure: SVT Ablation;  Surgeon: Will Meredith Leeds, MD;  Location: [facility];  Service: Cardiovascular;  Laterality: N/A;  . UMBILICAL HERNIA REPAIR N/A 05/18/2014   Procedure: HERNIA REPAIR UMBILICAL ADULT;  Surgeon: Ralene Ok, MD;  Location: [facility];  Service: General;  Laterality: N/A;  . UMBILICAL HERNIA REPAIR N/A 04/28/2015   Procedure: UMBILICAL HERNIA REPAIR WITH MESH;  Surgeon: Coralie Keens, MD;  [facility];  Service: General;  Laterality: N/A;    Family History  Problem Relation Age of Onset  . Colon polyps Father   . Colon cancer Neg Hx    . Stomach cancer Neg Hx     Social History:  reports that he quit smoking about 4 years ago. His smoking use included cigars. he has never used smokeless tobacco. He reports that he drinks alcohol. He reports that he does not use drugs.  Review of Systems:   Hypertension:    His blood pressure has been managed by his nephrologist and transplant team Also monitors at home  BP Readings from Last 3 Encounters:  10/04/17 134/82  09/13/17 (!) 161/83  08/28/17 118/78     Lipids:  His LDL is Well controlled with 10 mg Crestor Has no evidence of vascular disease    Lab Results  Component Value Date   CHOL 137 05/28/2017   CHOL 130 11/13/2016   CHOL 147 06/14/2016   Lab Results  Component Value Date   HDL 49.60 05/28/2017   HDL 41.40 11/13/2016   HDL 36.70 (L) 06/14/2016   Lab Results  Component Value Date   LDLCALC 73 05/28/2017   LDLCALC 68 11/13/2016   LDLCALC 87 06/14/2016   Lab Results  Component Value Date   TRIG 71.0 05/28/2017   TRIG 102.0 11/13/2016   TRIG 119.0 06/14/2016   Lab Results  Component Value Date   CHOLHDL 3 05/28/2017   CHOLHDL 3 11/13/2016   CHOLHDL 4 06/14/2016   No results found  for: LDLDIRECT   HYPOTHYROIDISM: He has had long-standing hypothyroidism  Has been taking his 100 g levothyroxine before breakfast consistently Recent TSH normal  Lab Results  Component Value Date   TSH 2.92 09/19/2017       HYPOGONADISM: 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 metabolic syndrome and had subjectively improved with supplementation using Fortesta  He has been off testosterone Since early 2016   Lab Results  Component Value Date   TESTOSTERONE 239.62 (L) 09/19/2017    Diabetic neuropathy: Has mild sensory loss on his last exam      Examination:   BP 134/82   Pulse 67   Ht 5\' 11"  (1.803 m)   Wt 294 lb (133.4 kg)   SpO2 98%   BMI 41.00 kg/m   Body mass index is 41 kg/m.    ASSESSMENT/ PLAN:     Diabetes type 2 with obesity:  See history of present illness for detailed discussion of his current management, problems identified and sugar patterns lately   Recently his blood sugar control is worsening with progressively higher A1c As above his blood sugars are higher after meals because of not taking his mealtime insulin as directed again   His blood sugar control is erratic because of not taking mealtime insulin before eating Also he can do better with his diet and exercise regimen and currently gaining weight progressively He had done fairly well in the past with basal bolus insulin regimen but appears unmotivated at this time  Recommendations:  discussed the need to take insulin before eating to keep his blood sugars from going up which they are consistently now He will start counting carbohydrates to help get the dose for his mealtime insulin He can try starting with 1:5 carbohydrate coverage which appears to be what he may need now and from his previous regimen's He may possibly need to cut down his [name] if overnight blood sugars come down progressively also He will start keeping a reminder on his phone or an alarm to remind him to check his blood sugar, take his insulin with carbohydrate counting before each meal Start walking daily   He is now not interested in trying an insulin pump despite discussing the advantages of this including better control and flexibility  HYPOGONADISM: He had previously benefited from testosterone supplementation He will start back on AndroGel since his fasting testosterone level is low again  This appears to be covered by his insurance and he will start with one pump on each arm Also for completeness will do free and total testosterone on the next visit   Patient Instructions  Set alarm to take insulin before the meal  Take insulin based on CARBS, divide grams by 5  Check blood sugars on waking up  3/7  Also check blood sugars about  2 hours after a meal and do this after different meals by rotation  Recommended blood sugar levels on waking up is 90-130 and about 2 hours after meal is 130-180  Please bring your blood sugar monitor to each visit, thank you    Counseling time on subjects discussed in assessment and plan sections is over 50% of today's 25 minute visit    Elayne Snare 10/04/2017, 3:58 PM

## 2017-10-14 ENCOUNTER — Ambulatory Visit (HOSPITAL_COMMUNITY)
Admission: RE | Admit: 2017-10-14 | Discharge: 2017-10-14 | Disposition: A | Discharge status: Home / Self Care | Payer: Medicare Other | Source: Ambulatory Visit | Attending: Internal Medicine | Admitting: Internal Medicine

## 2017-10-14 DIAGNOSIS — Z940 Kidney transplant status: Secondary | ICD-10-CM | POA: Insufficient documentation

## 2017-10-14 LAB — BASIC METABOLIC PANEL
ANION GAP: 7 (ref 5–15)
BUN: 24 mg/dL — AB (ref 6–20)
CHLORIDE: 111 mmol/L (ref 101–111)
CO2: 23 mmol/L (ref 22–32)
Calcium: 9.5 mg/dL (ref 8.9–10.3)
Creatinine, Ser: 1.64 mg/dL — ABNORMAL HIGH (ref 0.61–1.24)
GFR calc Af Amer: 52 mL/min — ABNORMAL LOW (ref >60)
GFR, EST NON AFRICAN AMERICAN: 45 mL/min — AB (ref >60)
GLUCOSE: 109 mg/dL — AB (ref 65–99)
POTASSIUM: 3.9 mmol/L (ref 3.5–5.1)
Sodium: 141 mmol/L (ref 135–145)

## 2017-10-14 LAB — URINALYSIS, COMPLETE (UACMP) WITH MICROSCOPIC
BACTERIA UA: NONE SEEN
Bilirubin Urine: NEGATIVE
Glucose, UA: 50 mg/dL — AB
KETONES UR: NEGATIVE mg/dL
Leukocytes, UA: NEGATIVE
NITRITE: NEGATIVE
Protein, ur: 30 mg/dL — AB
SPECIFIC GRAVITY, URINE: 1.013 (ref 1.005–1.030)
Squamous Epithelial / LPF: NONE SEEN
pH: 5 (ref 5.0–8.0)

## 2017-10-14 LAB — CBC
HEMATOCRIT: 39.6 % (ref 39.0–52.0)
HEMOGLOBIN: 12.2 g/dL — AB (ref 13.0–17.0)
MCH: 28.1 pg (ref 26.0–34.0)
MCHC: 30.8 g/dL (ref 30.0–36.0)
MCV: 91.2 fL (ref 78.0–100.0)
Platelets: 200 10*3/uL (ref 150–400)
RBC: 4.34 MIL/uL (ref 4.22–5.81)
RDW: 14.4 % (ref 11.5–15.5)
WBC: 4.6 10*3/uL (ref 4.0–10.5)

## 2017-10-14 LAB — PROTEIN / CREATININE RATIO, URINE
Creatinine, Urine: 73.48 mg/dL
Protein Creatinine Ratio: 0.79 mg/mg{Cre} — ABNORMAL HIGH (ref 0.00–0.15)
TOTAL PROTEIN, URINE: 58 mg/dL

## 2017-10-14 MED ORDER — SODIUM CHLORIDE 0.9 % IV SOLN
612.5000 mg | INTRAVENOUS | Status: DC
  Administered 2017-10-14: 612.5 mg via INTRAVENOUS
  Filled 2017-10-14: qty 612.5

## 2017-10-15 LAB — BK QUANT PCR (PLASMA/SERUM)
BK QUANTITATION PCR: NEGATIVE {copies}/mL
Log10 BK Qn PCR: UNDETERMINED log10copy/mL

## 2017-10-15 LAB — CMV DNA, QUANTITATIVE, PCR
CMV DNA QUANT: NEGATIVE [IU]/mL
LOG10 CMV QN DNA PL: UNDETERMINED {Log_IU}/mL

## 2017-11-11 ENCOUNTER — Ambulatory Visit (HOSPITAL_COMMUNITY)
Admission: RE | Admit: 2017-11-11 | Discharge: 2017-11-11 | Disposition: A | Discharge status: Home / Self Care | Payer: Medicare Other | Source: Ambulatory Visit | Attending: Internal Medicine | Admitting: Internal Medicine

## 2017-11-11 DIAGNOSIS — Z940 Kidney transplant status: Secondary | ICD-10-CM | POA: Insufficient documentation

## 2017-11-11 LAB — URINALYSIS, COMPLETE (UACMP) WITH MICROSCOPIC
BILIRUBIN URINE: NEGATIVE
Bacteria, UA: NONE SEEN
Ketones, ur: NEGATIVE mg/dL
Leukocytes, UA: NEGATIVE
NITRITE: NEGATIVE
PH: 7 (ref 5.0–8.0)
Protein, ur: 100 mg/dL — AB
Specific Gravity, Urine: 1.011 (ref 1.005–1.030)
Squamous Epithelial / LPF: NONE SEEN

## 2017-11-11 LAB — PROTEIN / CREATININE RATIO, URINE
Creatinine, Urine: 66.56 mg/dL
PROTEIN CREATININE RATIO: 1.88 mg/mg{creat} — AB (ref 0.00–0.15)
TOTAL PROTEIN, URINE: 125 mg/dL

## 2017-11-11 LAB — CBC
HEMATOCRIT: 41.2 % (ref 39.0–52.0)
HEMOGLOBIN: 13.3 g/dL (ref 13.0–17.0)
MCH: 28.5 pg (ref 26.0–34.0)
MCHC: 32.3 g/dL (ref 30.0–36.0)
MCV: 88.2 fL (ref 78.0–100.0)
Platelets: 213 10*3/uL (ref 150–400)
RBC: 4.67 MIL/uL (ref 4.22–5.81)
RDW: 14.3 % (ref 11.5–15.5)
WBC: 5.4 10*3/uL (ref 4.0–10.5)

## 2017-11-11 LAB — BASIC METABOLIC PANEL
ANION GAP: 10 (ref 5–15)
BUN: 19 mg/dL (ref 6–20)
CO2: 21 mmol/L — ABNORMAL LOW (ref 22–32)
Calcium: 9.8 mg/dL (ref 8.9–10.3)
Chloride: 109 mmol/L (ref 101–111)
Creatinine, Ser: 1.5 mg/dL — ABNORMAL HIGH (ref 0.61–1.24)
GFR, EST AFRICAN AMERICAN: 58 mL/min — AB (ref >60)
GFR, EST NON AFRICAN AMERICAN: 50 mL/min — AB (ref >60)
Glucose, Bld: 202 mg/dL — ABNORMAL HIGH (ref 65–99)
POTASSIUM: 4.1 mmol/L (ref 3.5–5.1)
SODIUM: 140 mmol/L (ref 135–145)

## 2017-11-11 MED ORDER — SODIUM CHLORIDE 0.9 % IV SOLN
612.5000 mg | INTRAVENOUS | Status: DC
  Administered 2017-11-11: 09:00:00 612.5 mg via INTRAVENOUS
  Filled 2017-11-11: qty 612.5

## 2017-11-12 LAB — BK QUANT PCR (PLASMA/SERUM)
BK Quantitaion PCR: NEGATIVE copies/mL
Log10 BK Qn PCR: UNDETERMINED log10copy/mL

## 2017-11-12 LAB — CMV DNA, QUANTITATIVE, PCR
CMV DNA QUANT: NEGATIVE [IU]/mL
Log10 CMV Qn DNA Pl: UNDETERMINED log10 IU/mL

## 2017-11-22 DIAGNOSIS — Z940 Kidney transplant status: Secondary | ICD-10-CM | POA: Diagnosis not present

## 2017-11-27 ENCOUNTER — Ambulatory Visit (INDEPENDENT_AMBULATORY_CARE_PROVIDER_SITE_OTHER): Payer: Medicare Other | Admitting: Podiatry

## 2017-11-27 ENCOUNTER — Encounter: Payer: Self-pay | Admitting: Podiatry

## 2017-11-27 DIAGNOSIS — B351 Tinea unguium: Secondary | ICD-10-CM

## 2017-11-27 DIAGNOSIS — M79675 Pain in left toe(s): Secondary | ICD-10-CM

## 2017-11-27 DIAGNOSIS — E1149 Type 2 diabetes mellitus with other diabetic neurological complication: Secondary | ICD-10-CM

## 2017-11-27 DIAGNOSIS — M79674 Pain in right toe(s): Secondary | ICD-10-CM

## 2017-11-27 NOTE — Progress Notes (Addendum)
Patient ID: Robert [name], male   DOB: 04/22/1961, 57 y.o.   MRN: [id number] Complaint:  Visit Type: Patient returns to my office for continued preventative foot care services. Complaint: Patient states" my nails have grown long and thick and become painful to walk and wear shoes" Patient has been diagnosed with DM and had a kidney transplant right side.  This is leading to increased leg swelling left.  . The patient presents for preventative foot care services. No changes to ROS  Podiatric Exam: Vascular: dorsalis pedis and posterior tibial pulses are palpable bilateral. Capillary return is immediate. Temperature gradient is WNL. Skin turgor WNL  Sensorium: Diminished  Semmes Weinstein monofilament test. Normal tactile sensation bilaterally. Nail Exam: Pt has thick disfigured discolored nails with subungual debris noted bilateral entire nail hallux through fifth toenails Ulcer Exam: There is no evidence of ulcer or pre-ulcerative changes or infection. Orthopedic Exam: Muscle tone and strength are WNL. No limitations in general ROM. No crepitus or effusions noted. Foot type and digits show no abnormalities. Bony prominences are unremarkable. HAV  B/L Skin: No Porokeratosis. No infection or ulcers  Diagnosis:  Onychomycosis, , Pain in right toe, pain in left toes, Diabetic neuropathy  Hallux limitus 1st MPJ  B/L  Treatment & Plan Procedures and Treatment: Consent by patient was obtained for treatment procedures. The patient understood the discussion of treatment and procedures well. All questions were answered thoroughly reviewed. Debridement of mycotic and hypertrophic toenails, 1 through 5 bilateral and clearing of subungual debris. No ulceration, no infection noted. Signed ABN for 2019. Return Visit-Office Procedure: Patient instructed to return to the office for a follow up visit 3 months for continued evaluation and treatment.    Gardiner Barefoot DPM

## 2017-11-28 DIAGNOSIS — Z6841 Body Mass Index (BMI) 40.0 and over, adult: Secondary | ICD-10-CM | POA: Diagnosis not present

## 2017-11-28 DIAGNOSIS — J029 Acute pharyngitis, unspecified: Secondary | ICD-10-CM | POA: Diagnosis not present

## 2017-11-28 DIAGNOSIS — G6289 Other specified polyneuropathies: Secondary | ICD-10-CM | POA: Diagnosis not present

## 2017-11-29 DIAGNOSIS — Z5181 Encounter for therapeutic drug level monitoring: Secondary | ICD-10-CM | POA: Diagnosis not present

## 2017-11-29 DIAGNOSIS — Z940 Kidney transplant status: Secondary | ICD-10-CM | POA: Diagnosis not present

## 2017-12-04 ENCOUNTER — Other Ambulatory Visit (INDEPENDENT_AMBULATORY_CARE_PROVIDER_SITE_OTHER): Payer: Medicare Other

## 2017-12-04 DIAGNOSIS — E291 Testicular hypofunction: Secondary | ICD-10-CM | POA: Diagnosis not present

## 2017-12-04 DIAGNOSIS — Z794 Long term (current) use of insulin: Secondary | ICD-10-CM | POA: Diagnosis not present

## 2017-12-04 DIAGNOSIS — E1165 Type 2 diabetes mellitus with hyperglycemia: Secondary | ICD-10-CM | POA: Diagnosis not present

## 2017-12-04 LAB — GLUCOSE, RANDOM: GLUCOSE: 237 mg/dL — AB (ref 70–99)

## 2017-12-05 LAB — FRUCTOSAMINE: Fructosamine: 287 umol/L — ABNORMAL HIGH (ref 0–285)

## 2017-12-05 LAB — TESTOSTERONE, FREE, TOTAL, SHBG
Sex Hormone Binding: 45.4 nmol/L (ref 19.3–76.4)
TESTOSTERONE FREE: 3.7 pg/mL — AB (ref 7.2–24.0)
Testosterone: 273 ng/dL (ref 264–916)

## 2017-12-09 ENCOUNTER — Ambulatory Visit (HOSPITAL_COMMUNITY)
Admission: RE | Admit: 2017-12-09 | Discharge: 2017-12-09 | Disposition: A | Discharge status: Home / Self Care | Payer: Medicare Other | Source: Ambulatory Visit | Attending: Internal Medicine | Admitting: Internal Medicine

## 2017-12-09 DIAGNOSIS — Z940 Kidney transplant status: Secondary | ICD-10-CM | POA: Insufficient documentation

## 2017-12-09 LAB — URINALYSIS, COMPLETE (UACMP) WITH MICROSCOPIC
BACTERIA UA: NONE SEEN
Bilirubin Urine: NEGATIVE
GLUCOSE, UA: NEGATIVE mg/dL
KETONES UR: NEGATIVE mg/dL
Leukocytes, UA: NEGATIVE
Nitrite: NEGATIVE
PROTEIN: 30 mg/dL — AB
SQUAMOUS EPITHELIAL / LPF: NONE SEEN
Specific Gravity, Urine: 1.01 (ref 1.005–1.030)
pH: 7 (ref 5.0–8.0)

## 2017-12-09 LAB — PROTEIN / CREATININE RATIO, URINE
Creatinine, Urine: 35.72 mg/dL
PROTEIN CREATININE RATIO: 1.06 mg/mg{creat} — AB (ref 0.00–0.15)
Total Protein, Urine: 38 mg/dL

## 2017-12-09 LAB — CBC
HCT: 42.8 % (ref 39.0–52.0)
Hemoglobin: 13.7 g/dL (ref 13.0–17.0)
MCH: 28.3 pg (ref 26.0–34.0)
MCHC: 32 g/dL (ref 30.0–36.0)
MCV: 88.4 fL (ref 78.0–100.0)
PLATELETS: 208 10*3/uL (ref 150–400)
RBC: 4.84 MIL/uL (ref 4.22–5.81)
RDW: 14.2 % (ref 11.5–15.5)
WBC: 6 10*3/uL (ref 4.0–10.5)

## 2017-12-09 LAB — BASIC METABOLIC PANEL
ANION GAP: 9 (ref 5–15)
BUN: 23 mg/dL — ABNORMAL HIGH (ref 6–20)
CALCIUM: 9.3 mg/dL (ref 8.9–10.3)
CO2: 22 mmol/L (ref 22–32)
Chloride: 107 mmol/L (ref 101–111)
Creatinine, Ser: 1.7 mg/dL — ABNORMAL HIGH (ref 0.61–1.24)
GFR, EST AFRICAN AMERICAN: 49 mL/min — AB (ref >60)
GFR, EST NON AFRICAN AMERICAN: 43 mL/min — AB (ref >60)
Glucose, Bld: 135 mg/dL — ABNORMAL HIGH (ref 65–99)
Potassium: 4.4 mmol/L (ref 3.5–5.1)
Sodium: 138 mmol/L (ref 135–145)

## 2017-12-09 MED ORDER — SODIUM CHLORIDE 0.9 % IV SOLN
612.5000 mg | INTRAVENOUS | Status: DC
  Administered 2017-12-09: 10:00:00 612.5 mg via INTRAVENOUS
  Filled 2017-12-09: qty 612.5

## 2017-12-10 ENCOUNTER — Other Ambulatory Visit: Payer: Self-pay | Admitting: Endocrinology

## 2017-12-10 ENCOUNTER — Ambulatory Visit (INDEPENDENT_AMBULATORY_CARE_PROVIDER_SITE_OTHER): Payer: Medicare Other | Admitting: Endocrinology

## 2017-12-10 ENCOUNTER — Encounter: Payer: Self-pay | Admitting: Endocrinology

## 2017-12-10 VITALS — BP 138/68 | HR 65 | Ht 71.0 in | Wt 291.8 lb

## 2017-12-10 DIAGNOSIS — Z794 Long term (current) use of insulin: Secondary | ICD-10-CM | POA: Diagnosis not present

## 2017-12-10 DIAGNOSIS — E230 Hypopituitarism: Secondary | ICD-10-CM | POA: Diagnosis not present

## 2017-12-10 DIAGNOSIS — E039 Hypothyroidism, unspecified: Secondary | ICD-10-CM | POA: Diagnosis not present

## 2017-12-10 DIAGNOSIS — E1165 Type 2 diabetes mellitus with hyperglycemia: Secondary | ICD-10-CM | POA: Diagnosis not present

## 2017-12-10 LAB — CMV DNA, QUANTITATIVE, PCR
CMV DNA Quant: NEGATIVE IU/mL
LOG10 CMV QN DNA PL: UNDETERMINED {Log_IU}/mL

## 2017-12-10 LAB — BK QUANT PCR (PLASMA/SERUM)
BK QUANTITATION PCR: NEGATIVE {copies}/mL
Log10 BK Qn PCR: UNDETERMINED log10copy/mL

## 2017-12-10 MED ORDER — TESTOSTERONE 20.25 MG/ACT (1.62%) TD GEL: TRANSDERMAL | 2 refills | Status: DC

## 2017-12-10 NOTE — Patient Instructions (Addendum)
Take carb coverage of 1:4 at lunch and 1:5 at dinner  Walk daily  ? androgel

## 2017-12-10 NOTE — Progress Notes (Signed)
Patient ID: Robert [name], male   DOB: 11/01/1960, 57 y.o.   MRN: [id number]   Reason for Appointment: Type II Diabetes follow-up   History of Present Illness   Diagnosis date:  2012  Previous history: His blood sugar has been difficult to control since onset. Initially was taking oral hypoglycemic drugs like glyburide but could not take metformin because of renal dysfunction. Since Victoza did not help his control he was started on Lantus and then U 500 insulin also His A1c has previously ranged from 7.8-9.8, the lowest level in 03/2013 He was on Victoza but because of his markedly decreased appetite and weight loss this was stopped in 9/15  Recent history:   Insulin regimen: Tresiba 16 units In a.m., Humulin U-500 as needed  His A1c is usually lower than expected for his blood sugars but his last level was 7%  Fructosamine is about the same at 287  Current blood sugar patterns, management and problems identified:  On his last visit he was reminded about taking his mealtime insulin before eating consistently and not as needed  He is trying to do this  However his blood sugars are fairly consistently high after lunch which is probably his largest carbohydrate meal of the day  Although he was told to use 1: 5 carbohydrate coverage he is not doing any accurate calculation and probably not estimating his carbohydrates accurately also, may take only for a 5 units for a sandwich  Blood sugars are sometimes higher after breakfast also  Fasting blood sugars are variable but not consistently high  Also occasionally has higher readings after his evening meal based on his carbohydrate intake  Still has significant variability in his blood sugars  His blood sugar download and patterns were evaluated and discussed with patient in detail  Proper timing of medications in relation to meals: Yes.          Monitors blood glucose:  recently 2.1 times a day .    Glucometer: One  Probation officer.          Blood Glucose readings by download  Mean values apply above for all meters except median for One Touch  PRE-MEAL Fasting Lunch Dinner Bedtime Overall  Glucose range:  65-254      Mean/median:  136     166+/-72   POST-MEAL PC Breakfast PC Lunch PC Dinner  Glucose range:   95-337  84-442  Mean/median:  269  200   Previous records:  Mean values apply above for all meters except median for One Touch  PRE-MEAL Fasting Lunch Dinner Bedtime Overall  Glucose range: 70-242  178-321  199-334  1 24-310    Mean/median: 127   251  190  195 +/-70     Meals: 2-3 meals per day. 7-8 am and Noon and Dinner 5-7 PM .        Physical activity: exercise: not walking much lately       Weight control:  Wt Readings from Last 3 Encounters:  12/10/17 291 lb 12.8 oz (132.4 kg)  12/09/17 296 lb 1 oz (134.3 kg)  11/11/17 291 lb 6 oz (758.8 kg)        Complications:   nephropathy, neuropathy   Diabetes labs:   Lab Results  Component Value Date   HGBA1C 7.0 (H) 09/19/2017   HGBA1C 6.3 05/28/2017   HGBA1C 5.8 11/13/2016   Lab Results  Component Value Date   MICROALBUR 224.5 Repeated and verified X2. (H) 01/04/2014  LDLCALC 73 05/28/2017   CREATININE 1.70 (H) 12/09/2017    Lab Results  Component Value Date   FRUCTOSAMINE 287 (H) 12/04/2017   FRUCTOSAMINE 279 (H) 09/19/2017   FRUCTOSAMINE 277 05/28/2017     Hospital Outpatient Visit on 12/09/2017  Component Date Value Ref Range Status  . Sodium 12/09/2017 138  135 - 145 mmol/L Final  . Potassium 12/09/2017 4.4  3.5 - 5.1 mmol/L Final  . Chloride 12/09/2017 107  101 - 111 mmol/L Final  . CO2 12/09/2017 22  22 - 32 mmol/L Final  . Glucose, Bld 12/09/2017 135* 65 - 99 mg/dL Final  . BUN 12/09/2017 23* 6 - 20 mg/dL Final  . Creatinine, Ser 12/09/2017 1.70* 0.61 - 1.24 mg/dL Final  . Calcium 12/09/2017 9.3  8.9 - 10.3 mg/dL Final  . GFR calc non Af Amer 12/09/2017 43* >60 mL/min Final  . GFR calc Af Amer 12/09/2017  49* >60 mL/min Final   Comment: (NOTE) The eGFR has been calculated using the CKD EPI equation. This calculation has not been validated in all clinical situations. eGFR's persistently <60 mL/min signify possible Chronic Kidney Disease.   Georgiann Hahn gap 12/09/2017 9  5 - 15 Final   Performed at [facility], [place] [address], [place], [place] [address]  . WBC 12/09/2017 6.0  4.0 - 10.5 K/uL Final  . RBC 12/09/2017 4.84  4.22 - 5.81 MIL/uL Final  . Hemoglobin 12/09/2017 13.7  13.0 - 17.0 g/dL Final  . HCT 12/09/2017 42.8  39.0 - 52.0 % Final  . MCV 12/09/2017 88.4  78.0 - 100.0 fL Final  . MCH 12/09/2017 28.3  26.0 - 34.0 pg Final  . MCHC 12/09/2017 32.0  30.0 - 36.0 g/dL Final  . RDW 12/09/2017 14.2  11.5 - 15.5 % Final  . Platelets 12/09/2017 208  150 - 400 K/uL Final   Performed at [facility], [place] [address] [place], [place] [address]  . Creatinine, Urine 12/09/2017 35.72  mg/dL Final  . Total Protein, Urine 12/09/2017 38  mg/dL Final   NO NORMAL RANGE ESTABLISHED FOR THIS TEST  . Protein Creatinine Ratio 12/09/2017 1.06* 0.00 - 0.15 mg/mg[Cre] Final   Performed at [facility], [place] [address] [place], [place] [address]  . Color, Urine 12/09/2017 STRAW* YELLOW Final  . APPearance 12/09/2017 CLEAR  CLEAR Final  . Specific Gravity, Urine 12/09/2017 1.010  1.005 - 1.030 Final  . pH 12/09/2017 7.0  5.0 - 8.0 Final  . Glucose, UA 12/09/2017 NEGATIVE  NEGATIVE mg/dL Final  . Hgb urine dipstick 12/09/2017 SMALL* NEGATIVE Final  . Bilirubin Urine 12/09/2017 NEGATIVE  NEGATIVE Final  . Ketones, ur 12/09/2017 NEGATIVE  NEGATIVE mg/dL Final  . Protein, ur 12/09/2017 30* NEGATIVE mg/dL Final  . Nitrite 12/09/2017 NEGATIVE  NEGATIVE Final  . Leukocytes, UA 12/09/2017 NEGATIVE  NEGATIVE Final  . RBC / HPF 12/09/2017 0-5  0 - 5 RBC/hpf Final  . WBC, UA 12/09/2017 0-5  0 - 5 WBC/hpf Final  . Bacteria, UA 12/09/2017 NONE SEEN  NONE SEEN Final  . Squamous Epithelial / LPF  12/09/2017 NONE SEEN  NONE SEEN Final   Performed at [facility], [place] [address], [place], [place] [address]  Lab on 12/04/2017  Component Date Value Ref Range Status  . Fructosamine 12/04/2017 287* 0 - 285 umol/L Final   Comment: Published reference interval for apparently healthy subjects between age 35 and 32 is 72 - 285 umol/L and  in a poorly controlled diabetic population is 228 - 563 umol/L with a mean of 396 umol/L.   Marland Kitchen Glucose, Bld 12/04/2017 237* 70 - 99 mg/dL Final  . Testosterone 12/04/2017 273  264 - 916 ng/dL Final   Comment: Adult male reference interval is based on a population of healthy nonobese males (BMI <30) between 55 and 61 years old. [place], [place] [phone]. PMID: [id number].   Marland Kitchen Testosterone, Free 12/04/2017 3.7* 7.2 - 24.0 pg/mL Final  . Sex Hormone Binding 12/04/2017 45.4  19.3 - 76.4 nmol/L Final    OTHER problems: See review of systems    Allergies as of 12/10/2017   No Known Allergies     Medication List        Accurate as of 12/10/17  8:26 AM. Always use your most recent med list.          acetaminophen 500 MG tablet Commonly known as:  TYLENOL Take 1,000 mg by mouth every 12 (twelve) hours as needed for mild pain.   amLODipine 5 MG tablet Commonly known as:  NORVASC Take 10 mg by mouth daily.   dextromethorphan-guaiFENesin 30-600 MG 12hr tablet Commonly known as:  MUCINEX DM Take 1 tablet by mouth 2 (two) times daily.   DULoxetine 30 MG capsule Commonly known as:  CYMBALTA Take 30 mg by mouth daily.   fluocinonide cream 0.05 % Commonly known as:  LIDEX Apply 1 application topically daily as needed (Skin inflammation (hand, thigh, back)).   furosemide 20 MG tablet Commonly known as:  LASIX Take 40 mg by mouth daily.   gabapentin 100 MG capsule Commonly known as:  NEURONTIN Take 200-300 mg by mouth See admin instructions. 200 mg in the morning and 300 mg at bedtime   glucose blood test strip Commonly  known as:  ONETOUCH VERIO Use to check blood sugars up to 6 times daily   HUMULIN R 500 UNIT/ML injection Generic drug:  insulin regular human CONCENTRATED Using a U-100 1cc/31G/5/16" INSULIN SYRINGE, draw up and inject 5-18 units into the skin three times a day 30 minutes prior to each meal   Insulin Pen Needle 31G X 5 MM Misc Commonly known as:  B-D UF III MINI PEN NEEDLES USE 4 PER DAY TO INJECT INSULIN.   Insulin Syringe-Needle U-100 31G X 5/16" 0.3 ML Misc Commonly known as:  BD INSULIN SYRINGE U/F Use to inject insulin 2-3 times per day   levothyroxine 100 MCG tablet Commonly known as:  SYNTHROID, LEVOTHROID Take 1 tablet (100 mcg total) by mouth daily.   lisinopril 5 MG tablet Commonly known as:  PRINIVIL,ZESTRIL Take 5 mg by mouth daily.   metoprolol tartrate 50 MG tablet Commonly known as:  LOPRESSOR TAKE 1 TABLET BY MOUTH TWICE A DAY   mycophenolate 250 MG capsule Commonly known as:  CELLCEPT Take 1,000 mg by mouth every 12 (twelve) hours.   NULOJIX 250 MG Solr injection Generic drug:  belatacept Inject 24.5 mLs into the vein every 28 (twenty-eight) days.   ONETOUCH DELICA LANCETS FINE Misc Use to check blood sugars 4-6 times daily   polyethylene glycol packet Commonly known as:  MIRALAX / GLYCOLAX Take 17 g by mouth daily as needed for mild constipation.   predniSONE 5 MG tablet Commonly known as:  DELTASONE Take 5 mg by mouth daily with breakfast.   rosuvastatin 10 MG tablet Commonly known as:  CRESTOR Take 1 tablet (10 mg total) by mouth daily.   senna-docusate 8.6-50 MG tablet Commonly known  as:  Senokot-S Take 1 tablet by mouth as needed for mild constipation.   sulfamethoxazole-trimethoprim 800-160 MG tablet Commonly known as:  BACTRIM DS,SEPTRA DS Take 1 tablet by mouth every Monday, Wednesday, and Friday.   tamsulosin 0.4 MG Caps capsule Commonly known as:  FLOMAX Take 0.8 mg by mouth at bedtime.   traMADol 50 MG tablet Commonly known  as:  ULTRAM Take 1 tablet (50 mg total) by mouth every 6 (six) hours as needed.   traZODone 50 MG tablet Commonly known as:  DESYREL Take 50-100 mg by mouth at bedtime as needed for sleep.   TRESIBA FLEXTOUCH 100 UNIT/ML Sopn FlexTouch Pen Generic drug:  insulin degludec INJECT 30 UNITS INTO THE SKIN EVERY MORNING   UNABLE TO FIND BiPAP: At bedtime   zolpidem 10 MG tablet Commonly known as:  AMBIEN Take 10-20 mg by mouth at bedtime as needed for sleep.       Allergies: No Known Allergies  Past Medical History:  Diagnosis Date  . Anemia    low hgb at present  . Arthritis    FEET  . Blood transfusion without reported diagnosis 1974   kidney removed - L  . Cancer ([place])    skin ca right shoulder, plastic dsyplasia, pre-Ca polpys removed on Colonoscopy- 07/2014  . Colon polyps 07/23/2014   Tubular adenomas x 5  . Constipation   . Diabetes mellitus without complication (HCC)    Type 2  . Difficult intubation    unsure of actual problem but it was during the January 28, 2015 procedure.  Marland Kitchen Dysrhythmia   . Eczema   . ESRD (end stage renal disease) ([place])    hEMO mwf  . Headache(784.0)    slight dull h/a due kidney failure  . Hyperlipidemia   . Hypertension    SVT h/o , followed by Dr. Claiborne Billings  . Hypothyroidism   . Macular degeneration   . Neuropathy    right hand and both feet  . PAT (paroxysmal atrial tachycardia) ([place])   . PSVT (paroxysmal supraventricular tachycardia) ([place])   . SBO (small bowel obstruction) ([place])   . Shortness of breath    with exertion  . Sleep apnea 06/18/11   split-night sleep study- [facility]., BiPAP- 13-16  . Thyroid disease   . Umbilical hernia    will repair with thi surgery    Past Surgical History:  Procedure Laterality Date  . AV FISTULA PLACEMENT Right 02/25/2015   Procedure: RIGHT ARTERIOVENOUS (AV) FISTULA CREATION;  Surgeon: Serafina Mitchell, MD;  Location: [facility];  Service: Vascular;  Laterality: Right;  .  AV FISTULA PLACEMENT Right 09/15/2015   Procedure: ARTERIOVENOUS (AV) FISTULA CREATION- RIGHT ARM;  Surgeon: Serafina Mitchell, MD;  Location: [facility];  Service: Vascular;  Laterality: Right;  . CAPD INSERTION N/A 05/18/2014   Procedure: LAPAROSCOPIC INSERTION CONTINUOUS AMBULATORY PERITONEAL DIALYSIS  (CAPD) CATHETER;  Surgeon: Ralene Ok, MD;  Location: [facility];  Service: General;  Laterality: N/A;  . COLONOSCOPY    . COLONOSCOPY WITH PROPOFOL N/A 07/12/2016   Procedure: COLONOSCOPY WITH PROPOFOL;  Surgeon: [name], MD;  Location: [facility];  Service: Endoscopy;  Laterality: N/A;  . declotting of fistula  08/2015  . ESOPHAGOGASTRODUODENOSCOPY (EGD) WITH PROPOFOL N/A 07/12/2016   Procedure: ESOPHAGOGASTRODUODENOSCOPY (EGD) WITH PROPOFOL;  Surgeon: [name], MD;  [facility]: [facility];  Service: Endoscopy;  Laterality: N/A;  . FISTULA SUPERFICIALIZATION Right 02/07/2016   Procedure: RIIGHT UPPER ARM FISTULA SUPERFICIALIZATION;  Surgeon: Elam Dutch, MD;  Location: [facility] OR;  Service: Vascular;  Laterality: Right;  . IJ catheter insertion    . INSERTION OF DIALYSIS CATHETER Right 02/25/2015   Procedure: INSERTION OF RIGHT INTERNAL JUGULAR DIALYSIS CATHETER;  Surgeon: Serafina Mitchell, MD;  Location: [facility];  Service: Vascular;  Laterality: Right;  . KIDNEY TRANSPLANT    . LAPAROSCOPIC REPOSITIONING CAPD CATHETER N/A 06/16/2014   Procedure: LAPAROSCOPIC REPOSITIONING CAPD CATHETER;  Surgeon: Ralene Ok, MD;  Location: [facility];  Service: General;  Laterality: N/A;  . LAPAROSCOPY N/A 05/04/2015   Procedure: LAPAROSCOPY DIAGNOSTIC LYSIS OF ADHESIONS;  Surgeon: Coralie Keens, MD;  Location: [facility];  Service: General;  Laterality: N/A;  . MINOR REMOVAL OF PERITONEAL DIALYSIS CATHETER N/A 04/28/2015   Procedure:  REMOVAL OF PERITONEAL DIALYSIS CATHETER;  Surgeon: Coralie Keens, MD;  Location: [facility];  Service: General;  Laterality: N/A;  . NEPHRECTOMY Left 1974  . RENAL BIOPSY Right 2012   . REVISON OF ARTERIOVENOUS FISTULA Right 05/26/2015   Procedure: SUPERFICIALIZATION OF ARTERIOVENOUS FISTULA WITH SIDE BRANCH LIGATIONS;  Surgeon: Serafina Mitchell, MD;  Location: [facility];  Service: Vascular;  Laterality: Right;  . SKIN CANCER EXCISION     right shoulder  . SVT ABLATION N/A 11/23/2016   Procedure: SVT Ablation;  Surgeon: Will Meredith Leeds, MD;  Location: [facility];  Service: Cardiovascular;  Laterality: N/A;  . UMBILICAL HERNIA REPAIR N/A 05/18/2014   Procedure: HERNIA REPAIR UMBILICAL ADULT;  Surgeon: Ralene Ok, MD;  Location: [facility];  Service: General;  Laterality: N/A;  . UMBILICAL HERNIA REPAIR N/A 04/28/2015   Procedure: UMBILICAL HERNIA REPAIR WITH MESH;  Surgeon: Coralie Keens, MD;  Location: [facility];  Service: General;  Laterality: N/A;    Family History  Problem Relation Age of Onset  . Colon polyps Father   . Colon cancer Neg Hx   . Stomach cancer Neg Hx     Social History:  reports that he quit smoking about 5 years ago. His smoking use included cigars. he has never used smokeless tobacco. He reports that he drinks alcohol. He reports that he does not use drugs.  Review of Systems:   Hypertension:    His blood pressure has been managed by his nephrologist and transplant team Also monitors at home  BP Readings from Last 3 Encounters:  12/10/17 138/68  12/09/17 (!) 122/54  11/11/17 (!) 128/44     Lipids:  His LDL is controlled with 10 mg Crestor Has no evidence of vascular disease    Lab Results  Component Value Date   CHOL 137 05/28/2017   CHOL 130 11/13/2016   CHOL 147 06/14/2016   Lab Results  Component Value Date   HDL 49.60 05/28/2017   HDL 41.40 11/13/2016   HDL 36.70 (L) 06/14/2016   Lab Results  Component Value Date   LDLCALC 73 05/28/2017   LDLCALC 68 11/13/2016   LDLCALC 87 06/14/2016   Lab Results  Component Value Date   TRIG 71.0 05/28/2017   TRIG 102.0 11/13/2016   TRIG 119.0 06/14/2016   Lab Results    Component Value Date   CHOLHDL 3 05/28/2017   CHOLHDL 3 11/13/2016   CHOLHDL 4 06/14/2016   No results found for: LDLDIRECT   HYPOTHYROIDISM: He has had long-standing hypothyroidism  Has been taking his 100 g levothyroxine before breakfast consistently Last TSH normal  Lab Results  Component Value Date   TSH 2.92 09/19/2017  HYPOGONADISM: 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 metabolic syndrome and had subjectively improved with supplementation using Fortesta  He has been off testosterone Since early 2016 He does continue to have fatigue and decreased motivation His pharmacy told him his insurance refused to pay for his AndroGel and unclear why  Lab Results  Component Value Date   TESTOSTERONE 273 12/04/2017   TESTOSTERONE 239.62 (L) 09/19/2017   TESTOSTERONE 369 06/14/2016      Diabetic neuropathy: Has mild sensory loss on his last exam      Examination:   BP 138/68 (BP Location: Left Arm, Patient Position: Sitting, Cuff Size: Large)   Pulse 65   Ht '5\' 11"'$  (1.803 m)   Wt 291 lb 12.8 oz (132.4 kg)   SpO2 99%   BMI 40.70 kg/m   Body mass index is 40.7 kg/m.    ASSESSMENT/ PLAN:    Diabetes type 2 with obesity, insulin-requiring:   See history of present illness for detailed discussion of his current management, problems identified and sugar patterns lately  Blood sugars are still fluctuating significantly with standard deviation 72 Highest readings are after lunch because of inadequate coverage with mealtime insulin and not taking enough insulin based on his current estimates Also may have higher readings after other meals  Recommendations:  Discussed examples of how to cover carbohydrates with mealtime insulin  Since he may need at least 6 units or more for a sandwich with 30 g of carbohydrate he likely will need 1: 4 or 5 ratio  He will need to be more consistent with trying to take the insulin 30 minutes before  eating  No change in [name] as yet  Lower fat diet  Regular walking especially around midday  Consider consultation with dietitian  Emphasized the need for weight loss  HYPOGONADISM: He does have fatigue and a decreased motivation Has consistently low total or free testosterone levels As before he likely has hypogonadotropic hypogonadism from insulin resistance syndrome He cannot get the prescription filled because of insurance refusal Will start the paperwork or phone calls as needed for prior authorization, discussed that this needs to be approved but he will also check with his pharmacy about the reason for denial  Patient Instructions  Take carb coverage of 1:4 at lunch  Walk daily   Counseling time on subjects discussed in the diabetes evaluation and management sections is over 50% of today's 25 minute visit    Elayne Snare 12/10/2017, 8:26 AM

## 2017-12-31 ENCOUNTER — Telehealth: Payer: Self-pay | Admitting: *Deleted

## 2017-12-31 DIAGNOSIS — D8989 Other specified disorders involving the immune mechanism, not elsewhere classified: Secondary | ICD-10-CM | POA: Diagnosis not present

## 2017-12-31 DIAGNOSIS — Z940 Kidney transplant status: Secondary | ICD-10-CM | POA: Diagnosis not present

## 2017-12-31 DIAGNOSIS — E1129 Type 2 diabetes mellitus with other diabetic kidney complication: Secondary | ICD-10-CM | POA: Diagnosis not present

## 2017-12-31 DIAGNOSIS — N2581 Secondary hyperparathyroidism of renal origin: Secondary | ICD-10-CM | POA: Diagnosis not present

## 2017-12-31 DIAGNOSIS — G629 Polyneuropathy, unspecified: Secondary | ICD-10-CM | POA: Diagnosis not present

## 2017-12-31 DIAGNOSIS — I120 Hypertensive chronic kidney disease with stage 5 chronic kidney disease or end stage renal disease: Secondary | ICD-10-CM | POA: Diagnosis not present

## 2017-12-31 DIAGNOSIS — I10 Essential (primary) hypertension: Secondary | ICD-10-CM | POA: Diagnosis not present

## 2017-12-31 DIAGNOSIS — D631 Anemia in chronic kidney disease: Secondary | ICD-10-CM | POA: Diagnosis not present

## 2017-12-31 DIAGNOSIS — E7800 Pure hypercholesterolemia, unspecified: Secondary | ICD-10-CM | POA: Diagnosis not present

## 2017-12-31 DIAGNOSIS — Z794 Long term (current) use of insulin: Secondary | ICD-10-CM | POA: Diagnosis not present

## 2017-12-31 DIAGNOSIS — Z6841 Body Mass Index (BMI) 40.0 and over, adult: Secondary | ICD-10-CM | POA: Diagnosis not present

## 2017-12-31 NOTE — Telephone Encounter (Signed)
Androgel 1.62% PA initiated and approved via CoverMyMeds.

## 2018-01-03 ENCOUNTER — Other Ambulatory Visit: Payer: Self-pay | Admitting: Endocrinology

## 2018-01-03 ENCOUNTER — Ambulatory Visit (HOSPITAL_COMMUNITY)
Admission: RE | Admit: 2018-01-03 | Discharge: 2018-01-03 | Disposition: A | Discharge status: Home / Self Care | Payer: Medicare Other | Source: Ambulatory Visit | Attending: Internal Medicine | Admitting: Internal Medicine

## 2018-01-03 DIAGNOSIS — Z4822 Encounter for aftercare following kidney transplant: Secondary | ICD-10-CM | POA: Diagnosis not present

## 2018-01-03 LAB — URINALYSIS, COMPLETE (UACMP) WITH MICROSCOPIC
BILIRUBIN URINE: NEGATIVE
Ketones, ur: NEGATIVE mg/dL
LEUKOCYTES UA: NEGATIVE
NITRITE: NEGATIVE
PROTEIN: 30 mg/dL — AB
Specific Gravity, Urine: 1.009 (ref 1.005–1.030)
Squamous Epithelial / LPF: NONE SEEN
WBC UA: NONE SEEN WBC/hpf (ref 0–5)
pH: 6 (ref 5.0–8.0)

## 2018-01-03 LAB — BASIC METABOLIC PANEL WITH GFR
Anion gap: 8 (ref 5–15)
BUN: 24 mg/dL — ABNORMAL HIGH (ref 6–20)
CO2: 24 mmol/L (ref 22–32)
Calcium: 9.2 mg/dL (ref 8.9–10.3)
Chloride: 105 mmol/L (ref 101–111)
Creatinine, Ser: 1.77 mg/dL — ABNORMAL HIGH (ref 0.61–1.24)
GFR calc Af Amer: 47 mL/min — ABNORMAL LOW
GFR calc non Af Amer: 41 mL/min — ABNORMAL LOW
Glucose, Bld: 260 mg/dL — ABNORMAL HIGH (ref 65–99)
Potassium: 4.2 mmol/L (ref 3.5–5.1)
Sodium: 137 mmol/L (ref 135–145)

## 2018-01-03 LAB — CBC
HEMATOCRIT: 42.4 % (ref 39.0–52.0)
HEMOGLOBIN: 13.8 g/dL (ref 13.0–17.0)
MCH: 28.2 pg (ref 26.0–34.0)
MCHC: 32.5 g/dL (ref 30.0–36.0)
MCV: 86.7 fL (ref 78.0–100.0)
Platelets: 201 10*3/uL (ref 150–400)
RBC: 4.89 MIL/uL (ref 4.22–5.81)
RDW: 14.3 % (ref 11.5–15.5)
WBC: 8.4 10*3/uL (ref 4.0–10.5)

## 2018-01-03 LAB — PROTEIN / CREATININE RATIO, URINE
Creatinine, Urine: 48.55 mg/dL
Protein Creatinine Ratio: 0.91 mg/mg{Cre} — ABNORMAL HIGH (ref 0.00–0.15)
Total Protein, Urine: 44 mg/dL

## 2018-01-03 MED ORDER — SODIUM CHLORIDE 0.9 % IV SOLN
612.5000 mg | INTRAVENOUS | Status: DC
  Administered 2018-01-03: 612.5 mg via INTRAVENOUS
  Filled 2018-01-03: qty 612.5

## 2018-01-03 MED ORDER — SODIUM CHLORIDE 0.9 % IV SOLN
612.5000 mg | INTRAVENOUS | Status: DC
  Filled 2018-01-03: qty 612.5

## 2018-01-04 LAB — BK QUANT PCR (PLASMA/SERUM)
BK QUANTITATION PCR: NEGATIVE {copies}/mL
LOG10 BK QN PCR: UNDETERMINED {Log_copies}/mL

## 2018-01-06 ENCOUNTER — Encounter (HOSPITAL_COMMUNITY): Payer: Medicare Other

## 2018-01-07 LAB — CMV DNA, QUANTITATIVE, PCR
CMV DNA QUANT: NEGATIVE [IU]/mL
LOG10 CMV QN DNA PL: UNDETERMINED {Log_IU}/mL

## 2018-01-22 DIAGNOSIS — Z23 Encounter for immunization: Secondary | ICD-10-CM | POA: Diagnosis not present

## 2018-01-31 ENCOUNTER — Encounter (HOSPITAL_COMMUNITY): Payer: Medicare Other

## 2018-01-31 ENCOUNTER — Other Ambulatory Visit (HOSPITAL_COMMUNITY): Payer: Self-pay

## 2018-02-03 ENCOUNTER — Ambulatory Visit (HOSPITAL_COMMUNITY)
Admission: RE | Admit: 2018-02-03 | Discharge: 2018-02-03 | Disposition: A | Discharge status: Home / Self Care | Payer: Medicare Other | Source: Ambulatory Visit | Attending: Internal Medicine | Admitting: Internal Medicine

## 2018-02-03 DIAGNOSIS — Z4822 Encounter for aftercare following kidney transplant: Secondary | ICD-10-CM | POA: Insufficient documentation

## 2018-02-03 LAB — BASIC METABOLIC PANEL
Anion gap: 10 (ref 5–15)
BUN: 26 mg/dL — AB (ref 6–20)
CHLORIDE: 103 mmol/L (ref 101–111)
CO2: 25 mmol/L (ref 22–32)
Calcium: 9.7 mg/dL (ref 8.9–10.3)
Creatinine, Ser: 1.87 mg/dL — ABNORMAL HIGH (ref 0.61–1.24)
GFR, EST AFRICAN AMERICAN: 44 mL/min — AB (ref >60)
GFR, EST NON AFRICAN AMERICAN: 38 mL/min — AB (ref >60)
Glucose, Bld: 236 mg/dL — ABNORMAL HIGH (ref 65–99)
POTASSIUM: 4.4 mmol/L (ref 3.5–5.1)
SODIUM: 138 mmol/L (ref 135–145)

## 2018-02-03 LAB — CBC
HEMATOCRIT: 43.8 % (ref 39.0–52.0)
Hemoglobin: 14.2 g/dL (ref 13.0–17.0)
MCH: 28.2 pg (ref 26.0–34.0)
MCHC: 32.4 g/dL (ref 30.0–36.0)
MCV: 87.1 fL (ref 78.0–100.0)
PLATELETS: 211 10*3/uL (ref 150–400)
RBC: 5.03 MIL/uL (ref 4.22–5.81)
RDW: 14.3 % (ref 11.5–15.5)
WBC: 6 10*3/uL (ref 4.0–10.5)

## 2018-02-03 LAB — URINALYSIS, COMPLETE (UACMP) WITH MICROSCOPIC
BACTERIA UA: NONE SEEN
Bilirubin Urine: NEGATIVE
GLUCOSE, UA: 150 mg/dL — AB
KETONES UR: NEGATIVE mg/dL
LEUKOCYTES UA: NEGATIVE
Nitrite: NEGATIVE
PROTEIN: 100 mg/dL — AB
Specific Gravity, Urine: 1.02 (ref 1.005–1.030)
pH: 6 (ref 5.0–8.0)

## 2018-02-03 LAB — PROTEIN / CREATININE RATIO, URINE
Creatinine, Urine: 128.12 mg/dL
PROTEIN CREATININE RATIO: 0.84 mg/mg{creat} — AB (ref 0.00–0.15)
TOTAL PROTEIN, URINE: 107 mg/dL

## 2018-02-03 MED ORDER — SODIUM CHLORIDE 0.9 % IV SOLN
612.5000 mg | INTRAVENOUS | Status: DC
  Administered 2018-02-03: 612.5 mg via INTRAVENOUS
  Filled 2018-02-03: qty 612.5

## 2018-02-04 LAB — BK QUANT PCR (PLASMA/SERUM)
BK Quantitaion PCR: NEGATIVE copies/mL
Log10 BK Qn PCR: UNDETERMINED log10copy/mL

## 2018-02-05 ENCOUNTER — Telehealth: Payer: Self-pay | Admitting: Cardiovascular Disease

## 2018-02-05 LAB — CMV DNA, QUANTITATIVE, PCR
CMV DNA Quant: NEGATIVE IU/mL
Log10 CMV Qn DNA Pl: UNDETERMINED log10 IU/mL

## 2018-02-05 NOTE — Telephone Encounter (Signed)
Called patient and LVM to call back and scheduled 6 month followup with Dr. Claiborne Billings on 02-14-18.

## 2018-02-13 ENCOUNTER — Telehealth: Payer: Self-pay | Admitting: Cardiovascular Disease

## 2018-02-13 NOTE — Telephone Encounter (Signed)
New message  Pt verbalzied that he is calling for RN  To see if his data information has been received from his device  Pt is requesting a call back Today   pt want to know if he has to bring his machine into the office for his appt 02/14/2018

## 2018-02-13 NOTE — Telephone Encounter (Signed)
Spoke with patient informing him that we can get a download from the computer. No need for him to bring in the machine to his visit tomorrow.

## 2018-02-14 ENCOUNTER — Ambulatory Visit (INDEPENDENT_AMBULATORY_CARE_PROVIDER_SITE_OTHER): Payer: Medicare Other | Admitting: Cardiovascular Disease

## 2018-02-14 ENCOUNTER — Encounter: Payer: Self-pay | Admitting: Cardiovascular Disease

## 2018-02-14 VITALS — BP 106/67 | HR 64 | Ht 71.0 in | Wt 297.0 lb

## 2018-02-14 DIAGNOSIS — Z794 Long term (current) use of insulin: Secondary | ICD-10-CM

## 2018-02-14 DIAGNOSIS — Z9889 Other specified postprocedural states: Secondary | ICD-10-CM | POA: Diagnosis not present

## 2018-02-14 DIAGNOSIS — I350 Nonrheumatic aortic (valve) stenosis: Secondary | ICD-10-CM | POA: Diagnosis not present

## 2018-02-14 DIAGNOSIS — Z940 Kidney transplant status: Secondary | ICD-10-CM | POA: Diagnosis not present

## 2018-02-14 DIAGNOSIS — E118 Type 2 diabetes mellitus with unspecified complications: Secondary | ICD-10-CM | POA: Diagnosis not present

## 2018-02-14 DIAGNOSIS — Z9989 Dependence on other enabling machines and devices: Secondary | ICD-10-CM

## 2018-02-14 DIAGNOSIS — G4733 Obstructive sleep apnea (adult) (pediatric): Secondary | ICD-10-CM

## 2018-02-14 DIAGNOSIS — I10 Essential (primary) hypertension: Secondary | ICD-10-CM | POA: Diagnosis not present

## 2018-02-14 DIAGNOSIS — Z8679 Personal history of other diseases of the circulatory system: Secondary | ICD-10-CM

## 2018-02-14 NOTE — Patient Instructions (Signed)
Medication Instructions:  Your physician recommends that you continue on your current medications as directed. Please refer to the Current Medication list given to you today.  Testing/Procedures: Your physician has requested that you have an echocardiogram in 6 MONTHS. Echocardiography is a painless test that uses sound waves to create images of your heart. It provides your doctor with information about the size and shape of your heart and how well your heart's chambers and valves are working. This procedure takes approximately one hour. There are no restrictions for this procedure.  This will be done at our [facility] location:  [place]: Your physician wants you to follow-up in: 6 months with Dr. Claiborne Billings (after echo). You will receive a reminder letter in the mail two months in advance. If you don't receive a letter, please call our office to schedule the follow-up appointment.   Any Other Special Instructions Will Be Listed Below (If Applicable).     If you need a refill on your cardiac medications before your next appointment, please call your pharmacy.

## 2018-02-14 NOTE — Progress Notes (Signed)
Patient ID: ELBY BLACKWELDER, male   DOB: 01/14/1961, 57 y.o.   MRN: [id number]     HPI: KALIN KYLER, is a 57 y.o. male who is a former patient of Dr. Terance Ice. He presents for a 7 month follow-up evaluation.  Mr. Zacharia Sowles  has a complex medical history that includes a congenital nonfunctioning left kidney for which he underwent left nephrectomy at age 57. He has a history of obesity, diabetes mellitus, and has been demonstrated to have proteinuria of his remaining single kidney, being status post open renal biopsy in [name] in November 2010. This revealed focal segmental glomerular sclerosis. At that time he was seen by Dr. Venita Sheffield in the nephrology department at [facility]. He sees Dr. Moshe Cipro in [place] for his kidney insufficiency and has been on ARB therapy in the past in an attempt to reduce proteinuria. He sees Dr. Dwyane Dee for his diabetes mellitus. He also has a history of hypothyroidism, hyperlipidemia, anxiety and depression. He has a history of prior supraventricular tachycardia requiring hospitalization in 2007 as was felt to be possibly AV nodal reentrant rhythm.  He has a history of complex sleep apnea initially diagnosed in 2007 with an AHI at that time of 56.7 per hour and during REM sleep this increased to 65.7 per hour. At that time he dropped his oxygen 84% and had loud snoring. He had been on BiPAP therapy. He underwent a 5 year followup split-night study in 2012 which again confirmed severe sleep apnea with an AHI of 60.7 on the baseline portion of the study. He was unable to achieve REM sleep at that time. Oxygen saturation during non-REM sleep at 83%. He was titrated utilizing BiPAP up to a setting of 16/12 cm water pressure. He does admit to continued residual daytime sleepiness despite using his BiPAP therapy.  An echo Doppler study in August 2012 showed mild concentric LVH with normal systolic function and grade 1  diastolic dysfunction. He had trace mitral and tricuspid insufficiency as well as pulmonic insufficiency. A nuclear perfusion study in August 2012 showed normal perfusion.  Mr. Vogel was on dialysis Monday, [name].  He is on the [place] transplant list.  In January 2017 he had an stress echo at [facility] as part of his transplant evaluation.  I do not have the results of this evaluation.  He has had some difficulty with a steal syndrome in his right hand since he had AV fistulas inserted in the forearm and most recently now in the upper arm.  He continues to use BiPAP with 100% compliance.  He had a [name].  He obtains his CPAP supplies online because they are cheaper, but had seen [facility].  He is using a Art therapist full face mask.  A download in the office from 05/28/2016 through 07/26/2016.  He is meeting compliance standards with usage at 85% usage stays greater than 4 hours at 77%.  He is averaging 6 hours and 43 minutes per night.  He is set at an IPAP pressure of 16 and EPAP pressure of 12.  His AHI was mildly increased at 6.5 with 5.2 per hour of apneic events and 1.3 of hypopnea events.  When I last saw him he had stopped taken Bystolic since his blood pressure had become low at the end of dialysis.  He had been on 5 mg on nondialysis days.  He tells me his aspirin was discontinued because he was  continuously having issues with bleeding following his dialysis when the catheter was removed.   He had an episode of SVT in December 2017, which was successfully aborted with vagal maneuvers.  In the emergency room, he had 2-3 more recurrences of SVT, of which, again, all aborted with vagal maneuvers.  He was started on verapamil and discharge from the hospital.  He has seen Dr. Curt Bears and had an EP study with attempted ablation, but apparently he was not able to be induced and ablation was not performed.   He denies any recurrent episodes of SVT.  Remotely, he  may of had some mild nonexertional musculoskeletal discomfort but has not experienced any of this recently.  Reportedly, his remote stress echo was negative for ischemia, although mild ST changes were noted in recovery.  He continues to be asymptomatic.  He underwent a kidney transplant from a living donor on 02/25/2017 at [facility].  He tolerated surgery well but has developed a postoperative inguinal hernia, which will ultimately need to be surgically repaired.  He received a new BiPAP machine from [facility] 2 weeks ago.  He has a full face mask, but admits to increased leak.    Since I saw him in September 2018, he had 2 hospital stays at [facility].  He developed intestinal bleeding due to diverticular disease and also had some dehydration issues.  He also underwent inguinal hernia surgery.  He denies any recent chest pain or palpitations.  He continues to use BiPAP with 100% compliance.  However, he did not have his machine with him for the days he was in the hospital.  He brought his machine with him today for interrogation.  Over the past month, usage hours and 6 hours and 32 minutes per night with an HI of 0.9.  He used to machine 27 of 30 nights nights that were not used was because he was in the hospital.  Usage greater than 4 hours was 26 of 30.  He is on a BiPAP pressure of 18/13 0.9.  Recently he has noticed a mild leak.  AHI was 5.6 over the past 7 days.  He has a ResMed fullface mask, Mirage Quatro mask.    Since I last saw him in November 2018, he has felt well.  He is seeing Dr. Azzie Glatter at [facility] from a nephrology standpoint.  He denies any episodes of chest pain or awareness of recurrent arrhythmia.  He admits to 100% use with CPAP.  [name] is his DME company.  He has a ResMed air 10 V auto unit with an EPAP pressure 14.  An IPAP pressure set at 18.  A download from 01/14/2017 through 02/12/2018 shows 100% compliance.  He is averaging 8 hours and 7 minutes of sleep.  AHI is excellent at  2.6.  He presents for reevaluation.   Past Medical History:  Diagnosis Date  . Anemia    low hgb at present  . Arthritis    FEET  . Blood transfusion without reported diagnosis 1974   kidney removed - L  . Cancer ([place])    skin ca right shoulder, plastic dsyplasia, pre-Ca polpys removed on Colonoscopy- 07/2014  . Colon polyps 07/23/2014   Tubular adenomas x 5  . Constipation   . Diabetes mellitus without complication (HCC)    Type 2  . Difficult intubation    unsure of actual problem but it was during the January 28, 2015 procedure.  Marland Kitchen Dysrhythmia   . Eczema   .  ESRD (end stage renal disease) ([place])    hEMO mwf  . Headache(784.0)    slight dull h/a due kidney failure  . Hyperlipidemia   . Hypertension    SVT h/o , followed by Dr. Claiborne Billings  . Hypothyroidism   . Macular degeneration   . Neuropathy    right hand and both feet  . PAT (paroxysmal atrial tachycardia) ([place])   . PSVT (paroxysmal supraventricular tachycardia) ([place])   . SBO (small bowel obstruction) ([place])   . Shortness of breath    with exertion  . Sleep apnea 06/18/11   split-night sleep study- [facility]., BiPAP- 13-16  . Thyroid disease   . Umbilical hernia    will repair with thi surgery    Past Surgical History:  Procedure Laterality Date  . AV FISTULA PLACEMENT Right 02/25/2015   Procedure: RIGHT ARTERIOVENOUS (AV) FISTULA CREATION;  Surgeon: Serafina Mitchell, MD;  Location: [place];  Service: Vascular;  Laterality: Right;  . AV FISTULA PLACEMENT Right 09/15/2015   Procedure: ARTERIOVENOUS (AV) FISTULA CREATION- RIGHT ARM;  Surgeon: Serafina Mitchell, MD;  Location: [place];  Service: Vascular;  Laterality: Right;  . CAPD INSERTION N/A 05/18/2014   Procedure: LAPAROSCOPIC INSERTION CONTINUOUS AMBULATORY PERITONEAL DIALYSIS  (CAPD) CATHETER;  Surgeon: Ralene Ok, MD;  Location: [place];  Service: General;  Laterality: N/A;  . COLONOSCOPY    . COLONOSCOPY WITH PROPOFOL N/A 07/12/2016    Procedure: COLONOSCOPY WITH PROPOFOL;  Surgeon: [name], MD;  Location: [facility];  Service: Endoscopy;  Laterality: N/A;  . declotting of fistula  08/2015  . ESOPHAGOGASTRODUODENOSCOPY (EGD) WITH PROPOFOL N/A 07/12/2016   Procedure: ESOPHAGOGASTRODUODENOSCOPY (EGD) WITH PROPOFOL;  Surgeon: [name], MD;  [facility];  Service: Endoscopy;  Laterality: N/A;  . FISTULA SUPERFICIALIZATION Right 02/07/2016   Procedure: RIIGHT UPPER ARM FISTULA SUPERFICIALIZATION;  Surgeon: Elam Dutch, MD;  Location: [place];  Service: Vascular;  Laterality: Right;  . IJ catheter insertion    . INSERTION OF DIALYSIS CATHETER Right 02/25/2015   Procedure: INSERTION OF RIGHT INTERNAL JUGULAR DIALYSIS CATHETER;  Surgeon: Serafina Mitchell, MD;  Location: [place];  Service: Vascular;  Laterality: Right;  . KIDNEY TRANSPLANT    . LAPAROSCOPIC REPOSITIONING CAPD CATHETER N/A 06/16/2014   Procedure: LAPAROSCOPIC REPOSITIONING CAPD CATHETER;  Surgeon: Ralene Ok, MD;  Location: [place];  Service: General;  Laterality: N/A;  . LAPAROSCOPY N/A 05/04/2015   Procedure: LAPAROSCOPY DIAGNOSTIC LYSIS OF ADHESIONS;  Surgeon: Coralie Keens, MD;  Location: [place];  Service: General;  Laterality: N/A;  . MINOR REMOVAL OF PERITONEAL DIALYSIS CATHETER N/A 04/28/2015   Procedure:  REMOVAL OF PERITONEAL DIALYSIS CATHETER;  Surgeon: Coralie Keens, MD;  Location: [place];  Service: General;  Laterality: N/A;  . NEPHRECTOMY Left 1974  . RENAL BIOPSY Right 2012  . REVISON OF ARTERIOVENOUS FISTULA Right 05/26/2015   Procedure: SUPERFICIALIZATION OF ARTERIOVENOUS FISTULA WITH SIDE BRANCH LIGATIONS;  Surgeon: Serafina Mitchell, MD;  Location: [place];  Service: Vascular;  Laterality: Right;  . SKIN CANCER EXCISION     right shoulder  . SVT ABLATION N/A 11/23/2016   Procedure: SVT Ablation;  Surgeon: Will Meredith Leeds, MD;  Location: [facility];  Service: Cardiovascular;  Laterality: N/A;  . UMBILICAL HERNIA  REPAIR N/A 05/18/2014   Procedure: HERNIA REPAIR UMBILICAL ADULT;  Surgeon: Ralene Ok, MD;  Location: [place];  Service: General;  Laterality: N/A;  . UMBILICAL HERNIA REPAIR N/A 04/28/2015  Procedure: UMBILICAL HERNIA REPAIR WITH MESH;  Surgeon: Coralie Keens, MD;  Location: [place];  Service: General;  Laterality: N/A;    Left nephrectomy in 1974.  No Known Allergies  Current Outpatient Medications  Medication Sig Dispense Refill  . acetaminophen (TYLENOL) 500 MG tablet Take 1,000 mg by mouth every 12 (twelve) hours as needed for mild pain.    Marland Kitchen amLODipine (NORVASC) 5 MG tablet Take 10 mg by mouth daily.    . belatacept (NULOJIX) 250 MG SOLR injection Inject 24.5 mLs into the vein every 28 (twenty-eight) days.     Marland Kitchen dextromethorphan-guaiFENesin (MUCINEX DM) 30-600 MG 12hr tablet Take 1 tablet by mouth 2 (two) times daily.    . DULoxetine (CYMBALTA) 30 MG capsule Take 30 mg by mouth daily.     . fluocinonide cream (LIDEX) 6.29 % Apply 1 application topically daily as needed (Skin inflammation (hand, thigh, back)).     . furosemide (LASIX) 20 MG tablet Take 40 mg by mouth daily.     Marland Kitchen gabapentin (NEURONTIN) 100 MG capsule Take 200-300 mg by mouth See admin instructions. 200 mg in the morning and 300 mg at bedtime    . glucose blood (ONETOUCH VERIO) test strip Use to check blood sugars up to 6 times daily 600 each 1  . HUMULIN R 500 UNIT/ML injection Using a U-100 1cc/31G/5/16" INSULIN SYRINGE, draw up and inject 5-18 units into the skin three times a day 30 minutes prior to each meal 10 mL 1  . Insulin Pen Needle (B-D UF III MINI PEN NEEDLES) 31G X 5 MM MISC USE 4 PER DAY TO INJECT INSULIN. 400 each 1  . Insulin Syringe-Needle U-100 (BD INSULIN SYRINGE ULTRAFINE) 31G X 5/16" 0.3 ML MISC Use to inject insulin 2-3 times per day 300 each 1  . levothyroxine (SYNTHROID, LEVOTHROID) 100 MCG tablet TAKE 1 TABLET BY MOUTH EVERY DAY 90 tablet 1  . lisinopril (PRINIVIL,ZESTRIL) 5 MG tablet Take 5  mg by mouth daily.    . metoprolol tartrate (LOPRESSOR) 50 MG tablet TAKE 1 TABLET BY MOUTH TWICE A DAY 180 tablet 3  . mycophenolate (CELLCEPT) 250 MG capsule Take 1,000 mg by mouth every 12 (twelve) hours.     Glory Rosebush DELICA LANCETS FINE MISC Use to check blood sugars 4-6 times daily 400 each 1  . polyethylene glycol (MIRALAX / GLYCOLAX) packet Take 17 g by mouth daily as needed for mild constipation.     . predniSONE (DELTASONE) 5 MG tablet Take 5 mg by mouth daily with breakfast.    . rosuvastatin (CRESTOR) 10 MG tablet Take 1 tablet (10 mg total) by mouth daily. 90 tablet 1  . senna-docusate (SENOKOT-S) 8.6-50 MG tablet Take 1 tablet by mouth as needed for mild constipation.    . sulfamethoxazole-trimethoprim (BACTRIM DS,SEPTRA DS) 800-160 MG tablet Take 1 tablet by mouth every Monday, Wednesday, and Friday.     . tamsulosin (FLOMAX) 0.4 MG CAPS capsule Take 0.8 mg by mouth at bedtime.     . Testosterone (ANDROGEL PUMP) 20.25 MG/ACT (1.62%) GEL Apply 1 pump to each shoulder daily 75 g 2  . traMADol (ULTRAM) 50 MG tablet Take 1 tablet (50 mg total) by mouth every 6 (six) hours as needed. (Patient taking differently: Take 100 mg by mouth every 6 (six) hours as needed (for pain). ) 15 tablet 0  . traZODone (DESYREL) 50 MG tablet Take 50-100 mg by mouth at bedtime as needed for sleep.     [name]  FLEXTOUCH 100 UNIT/ML SOPN FlexTouch Pen INJECT 30 UNITS INTO THE SKIN EVERY MORNING (Patient taking differently: INJECT 16 UNITS INTO THE SKIN EVERY MORNING) 15 pen 1  . UNABLE TO FIND BiPAP: At bedtime    . zolpidem (AMBIEN) 10 MG tablet Take 10-20 mg by mouth at bedtime as needed for sleep.      No current facility-administered medications for this visit.    Social history is notable in that he is married for 29 years. His 3 children. He has a degree as a chemistry. 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 He does drink occasional alcohol. He has smoked cigars.  ROS General: Negative; No  fevers, chills, or night sweats; positive for morbid obesity HEENT: Negative; No changes in vision or hearing, sinus congestion, difficulty swallowing Pulmonary: Negative; No cough, wheezing, shortness of breath, hemoptysis Cardiovascular: No chest pain, PND, orthopnea, or recurrent SVT. GI: Negative; No nausea, vomiting, diarrhea, or abdominal pain GU: Negative; No dysuria, hematuria, or difficulty voiding Renal: no longer on dialysis, status post renal transplant Musculoskeletal: Negative; no myalgias, joint pain, or weakness Hematologic/Oncology: Negative; no easy bruising, bleeding Endocrine: Positive for diabetes and hypothyroidism Neuro: Negative; no changes in balance, headaches Skin: Negative; No rashes or skin lesions Psychiatric: Negative; No behavioral problems, depression Sleep: Positive for OSA on BiPAP; no daytime sleepiness, hypersomnolence, bruxism, restless legs, hypnogognic hallucinations, no cataplexy Other comprehensive 14 point system review is negative.  PE BP 106/67   Pulse 64   Ht '5\' 11"'$  (1.803 m)   Wt 297 lb (134.7 kg)   BMI 41.42 kg/m    Repeat blood pressure by me was 110/68, taken from his left arm.  He has a right upper arm fistula  Wt Readings from Last 3 Encounters:  02/14/18 297 lb (134.7 kg)  02/03/18 (!) 300 lb 4.8 oz (136.2 kg)  01/03/18 (!) 301 lb (136.5 kg)   General: Alert, oriented, no distress.  Skin: normal turgor, no rashes, warm and dry HEENT: Normocephalic, atraumatic. Pupils equal round and reactive to light; sclera anicteric; extraocular muscles intact;  Nose without nasal septal hypertrophy Mouth/Parynx benign; Mallinpatti scale 3 Neck: No JVD, no carotid bruits; normal carotid upstroke Lungs: clear to ausculatation and percussion; no wheezing or rales Chest wall: without tenderness to palpitation Heart: PMI not displaced, RRR, s1 s2 normal, 1/6 systolic murmur, 2/6 diastolic murmur c/w AR, no rubs, gallops, thrills, or  heaves Abdomen: soft, nontender; no hepatosplenomehaly, BS+; abdominal aorta nontender and not dilated by palpation. Back: no CVA tenderness Pulses 2+ Musculoskeletal: full range of motion, normal strength, no joint deformities Extremities: no clubbing cyanosis or edema, Homan's sign negative  Neurologic: grossly nonfocal; Cranial nerves grossly wnl Psychologic: Normal mood and affect   ECG (independently read by me): NSR at 64; normal intervals.  No ectopy  November 2018 ECG (independently read by me): normal sinus rhythm at 67 bpm.  Normal intervals.  No ectopy.  September 2018 ECG (independently read by me): normal sinus rhythm at 71 bpm.  No ST segment changes.  Normal intervals.  May 2018 ECG (independently read by me): Normal sinus rhythm at 89 bpm.  No STT changes .  PR interval 126 ms.  QTc interval 457 ms. Normal ECG.  October 2017 ECG (independently read by me): Sinus tachycardia 101 bpm.  April 2017 ECG (independently read by me): Sinus tachycardia 100 bpm.  2.  TC interval 45 ms.  November 2014 ECG: Normal sinus 34 beats per minute. PR interval 138 ms.  QT interval 448 ms.  LABS: BMP Latest Ref Rng & Units 02/03/2018 01/03/2018 12/09/2017  Glucose 65 - 99 mg/dL 236(H) 260(H) 135(H)  BUN 6 - 20 mg/dL 26(H) 24(H) 23(H)  Creatinine 0.61 - 1.24 mg/dL 1.87(H) 1.77(H) 1.70(H)  BUN/Creat Ratio 9 - 20 - - -  Sodium 135 - 145 mmol/L 138 137 138  Potassium 3.5 - 5.1 mmol/L 4.4 4.2 4.4  Chloride 101 - 111 mmol/L 103 105 107  CO2 22 - 32 mmol/L '25 24 22  '$ Calcium 8.9 - 10.3 mg/dL 9.7 9.2 9.3   Hepatic Function Latest Ref Rng & Units 08/07/2017 07/26/2017 11/13/2016  Total Protein 6.5 - 8.1 g/dL 8.2(H) 6.5 -  Albumin 3.5 - 5.0 g/dL 5.3(H) 4.2 -  AST 15 - 41 U/L 17 14(L) -  ALT 17 - 63 U/L '17 18 13  '$ Alk Phosphatase 38 - 126 U/L 178(H) 122 -  Total Bilirubin 0.3 - 1.2 mg/dL 1.0 0.6 -    CBC Latest Ref Rng & Units 02/03/2018 01/03/2018 12/09/2017  WBC 4.0 - 10.5 K/uL 6.0 8.4 6.0   Hemoglobin 13.0 - 17.0 g/dL 14.2 13.8 13.7  Hematocrit 39.0 - 52.0 % 43.8 42.4 42.8  Platelets 150 - 400 K/uL 211 201 208    Lab Results  Component Value Date   MCV 87.1 02/03/2018   MCV 86.7 01/03/2018   MCV 88.4 12/09/2017   Lab Results  Component Value Date   TSH 2.92 09/19/2017   Lab Results  Component Value Date   HGBA1C 7.0 (H) 09/19/2017   Lipid Panel     Component Value Date/Time   CHOL 137 05/28/2017 1004   TRIG 71.0 05/28/2017 1004   HDL 49.60 05/28/2017 1004   CHOLHDL 3 05/28/2017 1004   VLDL 14.2 05/28/2017 1004   LDLCALC 73 05/28/2017 1004    RADIOLOGY: No results found.  IMPRESSION:  1. Essential hypertension   2. S/P ablation operation for arrhythmia   3. Mild aortic stenosis/, mild-to-moderate aortic insufficiency   4. OSA on BIPAP   5. Morbid obesity ([place])   6. Type 2 diabetes mellitus with complication, with long-term current use of insulin ([place])   7. History of renal transplantation     ASSESSMENT AND PLAN: Mr. Wenner is a 57 year old Caucasian male who has a history of morbid obesity, hypertension, diabetes mellitus, hyperlipidemia, and end-stage kidney disease.  He is status post remote nephrectomy  1974 and on 02/25/2017 after having been on hemodialysis he underwent successful renal transplantation from a living donor done at [facility].  He developed a postoperative inguinal hernia, which subsequently was repaired.  There also is a history of possible diverticular bleeding.  He has a history of SVT, but he has not had any recurrence.  His blood pressure today is controlled on amlodipine 5 mg, metoprolol 50 mg twice a day, and he was started on lisinopril by his nephrologist at [facility], currently 5 mg daily.  He is on Crestor 10 mg for hyperlipidemia with target LDL less than 70.  He has hypothyroidism and is maintained on levothyroxine at 100 g.  He has long-standing diabetes and is on insulin.  He is on CPAP therapy.  I  obtained a download in the office which shows 100% compliance.  On his current 18/14 BiPAP pressure.  AHI is excellent at 2.6 per hour.  BMI today is 41.42.  Weight loss and increased exercise was recommended.  He has documented mild-to-moderate aortic insufficiency.  His last echo Doppler  study was in January 2018.  He will continue present therapy.  I will see him in 6 months for reevaluation, and prior to that evaluation, he will undergo a follow-up echo Doppler study to assess systolic and diastolic function as well as his aortic valve disease.   Time spent: 25 minutes Troy Sine, MD, [facility]  02/16/2018 10:41 AM

## 2018-02-16 ENCOUNTER — Encounter: Payer: Self-pay | Admitting: Cardiovascular Disease

## 2018-02-21 DIAGNOSIS — D899 Disorder involving the immune mechanism, unspecified: Secondary | ICD-10-CM | POA: Diagnosis not present

## 2018-02-21 DIAGNOSIS — Z940 Kidney transplant status: Secondary | ICD-10-CM | POA: Diagnosis not present

## 2018-02-26 ENCOUNTER — Ambulatory Visit (INDEPENDENT_AMBULATORY_CARE_PROVIDER_SITE_OTHER): Payer: Medicare Other | Admitting: Podiatry

## 2018-02-26 ENCOUNTER — Encounter: Payer: Self-pay | Admitting: Podiatry

## 2018-02-26 DIAGNOSIS — M79674 Pain in right toe(s): Secondary | ICD-10-CM

## 2018-02-26 DIAGNOSIS — M79675 Pain in left toe(s): Secondary | ICD-10-CM | POA: Diagnosis not present

## 2018-02-26 DIAGNOSIS — B351 Tinea unguium: Secondary | ICD-10-CM

## 2018-02-26 DIAGNOSIS — E1149 Type 2 diabetes mellitus with other diabetic neurological complication: Secondary | ICD-10-CM

## 2018-02-26 NOTE — Progress Notes (Signed)
Patient ID: Robert [name], male   DOB: 29-Nov-1960, 57 y.o.   MRN: [id number] Complaint:  Visit Type: Patient returns to my office for continued preventative foot care services. Complaint: Patient states" my nails have grown long and thick and become painful to walk and wear shoes" Patient has been diagnosed with DM and had a kidney transplant right side.  This is leading to increased leg swelling left.  . The patient presents for preventative foot care services. No changes to ROS  Podiatric Exam: Vascular: dorsalis pedis and posterior tibial pulses are palpable bilateral. Capillary return is immediate. Temperature gradient is WNL. Skin turgor WNL  Sensorium: Diminished  Semmes Weinstein monofilament test. Normal tactile sensation bilaterally. Nail Exam: Pt has thick disfigured discolored nails with subungual debris noted bilateral entire nail hallux through fifth toenails Ulcer Exam: There is no evidence of ulcer or pre-ulcerative changes or infection. Orthopedic Exam: Muscle tone and strength are WNL. No limitations in general ROM. No crepitus or effusions noted. Foot type and digits show no abnormalities. Bony prominences are unremarkable. HAV  B/L Skin: No Porokeratosis. No infection or ulcers  Diagnosis:  Onychomycosis, , Pain in right toe, pain in left toes, Diabetic neuropathy  Hallux limitus 1st MPJ  B/L  Treatment & Plan Procedures and Treatment: Consent by patient was obtained for treatment procedures. The patient understood the discussion of treatment and procedures well. All questions were answered thoroughly reviewed. Debridement of mycotic and hypertrophic toenails, 1 through 5 bilateral and clearing of subungual debris. No ulceration, no infection noted. Signed ABN for 2019. Discussed his squeaky shoes. Return Visit-Office Procedure: Patient instructed to return to the office for a follow up visit 3 months for continued evaluation and treatment.    Gardiner Barefoot DPM

## 2018-03-04 ENCOUNTER — Other Ambulatory Visit (HOSPITAL_COMMUNITY): Payer: Self-pay

## 2018-03-04 ENCOUNTER — Encounter (HOSPITAL_COMMUNITY): Payer: Medicare Other

## 2018-03-05 ENCOUNTER — Ambulatory Visit (HOSPITAL_COMMUNITY)
Admission: RE | Admit: 2018-03-05 | Discharge: 2018-03-05 | Disposition: A | Discharge status: Home / Self Care | Payer: Medicare Other | Source: Ambulatory Visit | Attending: Internal Medicine | Admitting: Internal Medicine

## 2018-03-05 DIAGNOSIS — Z950 Presence of cardiac pacemaker: Secondary | ICD-10-CM | POA: Diagnosis not present

## 2018-03-05 LAB — BASIC METABOLIC PANEL
Anion gap: 8 (ref 5–15)
BUN: 22 mg/dL — AB (ref 6–20)
CO2: 22 mmol/L (ref 22–32)
Calcium: 9.2 mg/dL (ref 8.9–10.3)
Chloride: 109 mmol/L (ref 101–111)
Creatinine, Ser: 1.75 mg/dL — ABNORMAL HIGH (ref 0.61–1.24)
GFR calc Af Amer: 48 mL/min — ABNORMAL LOW (ref >60)
GFR calc non Af Amer: 41 mL/min — ABNORMAL LOW (ref >60)
Glucose, Bld: 193 mg/dL — ABNORMAL HIGH (ref 65–99)
Potassium: 4.9 mmol/L (ref 3.5–5.1)
SODIUM: 139 mmol/L (ref 135–145)

## 2018-03-05 LAB — PROTEIN / CREATININE RATIO, URINE
Creatinine, Urine: 130.5 mg/dL
PROTEIN CREATININE RATIO: 1.36 mg/mg{creat} — AB (ref 0.00–0.15)
Total Protein, Urine: 178 mg/dL

## 2018-03-05 LAB — CBC
HEMATOCRIT: 41.4 % (ref 39.0–52.0)
Hemoglobin: 13.3 g/dL (ref 13.0–17.0)
MCH: 27.7 pg (ref 26.0–34.0)
MCHC: 32.1 g/dL (ref 30.0–36.0)
MCV: 86.1 fL (ref 78.0–100.0)
Platelets: 218 10*3/uL (ref 150–400)
RBC: 4.81 MIL/uL (ref 4.22–5.81)
RDW: 14.1 % (ref 11.5–15.5)
WBC: 7.5 10*3/uL (ref 4.0–10.5)

## 2018-03-05 MED ORDER — SODIUM CHLORIDE 0.9 % IV SOLN
612.5000 mg | INTRAVENOUS | Status: DC
  Administered 2018-03-05: 612.5 mg via INTRAVENOUS
  Filled 2018-03-05: qty 612.5

## 2018-03-06 ENCOUNTER — Other Ambulatory Visit (INDEPENDENT_AMBULATORY_CARE_PROVIDER_SITE_OTHER): Payer: Medicare Other

## 2018-03-06 DIAGNOSIS — E1165 Type 2 diabetes mellitus with hyperglycemia: Secondary | ICD-10-CM

## 2018-03-06 DIAGNOSIS — E039 Hypothyroidism, unspecified: Secondary | ICD-10-CM | POA: Diagnosis not present

## 2018-03-06 DIAGNOSIS — E230 Hypopituitarism: Secondary | ICD-10-CM | POA: Diagnosis not present

## 2018-03-06 DIAGNOSIS — Z794 Long term (current) use of insulin: Secondary | ICD-10-CM | POA: Diagnosis not present

## 2018-03-06 LAB — BK QUANT PCR (PLASMA/SERUM)
BK QUANTITATION PCR: NEGATIVE {copies}/mL
LOG10 BK QN PCR: UNDETERMINED {Log_copies}/mL

## 2018-03-06 LAB — TSH: TSH: 2.05 u[IU]/mL (ref 0.35–4.50)

## 2018-03-06 LAB — GLUCOSE, RANDOM: Glucose, Bld: 169 mg/dL — ABNORMAL HIGH (ref 70–99)

## 2018-03-06 LAB — TESTOSTERONE: Testosterone: 170.91 ng/dL — ABNORMAL LOW (ref 300.00–890.00)

## 2018-03-06 LAB — HEMOGLOBIN A1C: Hgb A1c MFr Bld: 7.5 % — ABNORMAL HIGH (ref 4.6–6.5)

## 2018-03-06 LAB — T4, FREE: Free T4: 0.86 ng/dL (ref 0.60–1.60)

## 2018-03-07 LAB — CMV DNA, QUANTITATIVE, PCR
CMV DNA QUANT: NEGATIVE [IU]/mL
Log10 CMV Qn DNA Pl: UNDETERMINED log10 IU/mL

## 2018-03-11 ENCOUNTER — Ambulatory Visit (INDEPENDENT_AMBULATORY_CARE_PROVIDER_SITE_OTHER): Payer: Medicare Other | Admitting: Endocrinology

## 2018-03-11 ENCOUNTER — Encounter: Payer: Self-pay | Admitting: Endocrinology

## 2018-03-11 VITALS — BP 128/72 | HR 71 | Ht 71.0 in | Wt 298.6 lb

## 2018-03-11 DIAGNOSIS — Z794 Long term (current) use of insulin: Secondary | ICD-10-CM | POA: Diagnosis not present

## 2018-03-11 DIAGNOSIS — E1165 Type 2 diabetes mellitus with hyperglycemia: Secondary | ICD-10-CM | POA: Diagnosis not present

## 2018-03-11 DIAGNOSIS — E230 Hypopituitarism: Secondary | ICD-10-CM

## 2018-03-11 MED ORDER — VICTOZA 18 MG/3ML ~~LOC~~ SOPN
1.2000 mg | PEN_INJECTOR | Freq: Every day | SUBCUTANEOUS | 3 refills | Status: DC
Start: 2018-03-11 — End: 2018-07-16

## 2018-03-11 NOTE — Patient Instructions (Addendum)
androgel 3 pumps on shouder  Insulin for snacks   More testing  Check blood sugars on waking up  3-4/7  Also check blood sugars about 2 hours after a meal and do this after different meals by rotation  Recommended blood sugar levels on waking up is 90-130 and about 2 hours after meal is 130-160  Please bring your blood sugar monitor to each visit, thank you  Start VICTOZA injection as shown once daily at the same time of the day.  Dial the dose to 0.6 mg on the pen for the first week.   You may inject in the stomach, thigh or arm. You may experience nausea in the first few days which usually goes away.  You will feel fullness of the stomach with starting the medication and should try to keep the portions at meals small. After 1 week increase the dose to 1.2mg  daily if sugars still upt.    If any questions or concerns are present call the office or the [place] helpline at [phone]. Visit [email or web address] for more useful information

## 2018-03-11 NOTE — Progress Notes (Signed)
Patient ID: Robert [name], male   DOB: 11-08-1960, 57 y.o.   MRN: [id number]   Reason for Appointment: Endocrinology follow-up  History of Present Illness   DIABETES diagnosis date:  2012  Previous history: His blood sugar has been difficult to control since onset. Initially was taking oral hypoglycemic drugs like glyburide but could not take metformin because of renal dysfunction. Since Victoza did not help his control he was started on Lantus and then U 500 insulin also His A1c has previously ranged from 7.8-9.8, the lowest level in 03/2013 He was on Victoza but because of his markedly decreased appetite and weight loss this was stopped in 9/15  Recent history:   Insulin regimen: Tresiba 16 units In a.m., Humulin U-500 before meals 1: 5 carbohydrate ratio  His A1c is trending higher and now 7.5 compared to 7% before Last fructosamine was 287  Current blood sugar patterns, management and problems identified:  His blood sugars are very erratic now including fasting  He thinks that his morning sugars may be high because of snacking at night including ice cream without any insulin coverage  Also his blood sugar monitoring has been inconsistent and mostly in the mornings with sporadic readings in the late afternoon and evenings  He has some blood sugars that are fairly good in the morning but only rarely at target in the afternoon and evening  He still has difficulty trying to take his Humulin R consistently 30 minutes before eating  He is thinks he is trying to use carbohydrate counting for calculating his mealtime dose  No weight loss being achieved recently  Currently not doing any exercise either  Proper timing of medications in relation to meals: Yes.          Monitors blood glucose:  recently 2.1 times a day .    Glucometer: One Prob[employer].          Blood Glucose readings by download  Mean values apply above for all meters except median for One  Touch  PRE-MEAL Fasting Lunch Dinner Bedtime Overall  Glucose range:  81-325     81-372  Mean/median:  166  172   181 +/-66   POST-MEAL PC Breakfast PC Lunch PC Dinner  Glucose range:   148-372  146-241  Mean/median:   269  201   Previous readings:  Mean values apply above for all meters except median for One Touch  PRE-MEAL Fasting Lunch Dinner Bedtime Overall  Glucose range:  65-254      Mean/median:  136     166+/-72   POST-MEAL PC Breakfast PC Lunch PC Dinner  Glucose range:   95-337  84-442  Mean/median:  269  200     Meals: 2-3 meals per day. 7-8 am and Noon and Dinner 5-7 PM .        Physical activity: exercise: not walking much       Weight control:  Wt Readings from Last 3 Encounters:  03/11/18 298 lb 9.6 oz (135.4 kg)  03/05/18 298 lb 8 oz (135.4 kg)  02/14/18 297 lb (542.7 kg)        Complications:   nephropathy, neuropathy   Diabetes labs:   Lab Results  Component Value Date   HGBA1C 7.5 (H) 03/06/2018   HGBA1C 7.0 (H) 09/19/2017   HGBA1C 6.3 05/28/2017   Lab Results  Component Value Date   MICROALBUR 224.5 Repeated and verified X2. (H) 01/04/2014   LDLCALC 73 05/28/2017  CREATININE 1.75 (H) 03/05/2018    Lab Results  Component Value Date   FRUCTOSAMINE 287 (H) 12/04/2017   FRUCTOSAMINE 279 (H) 09/19/2017   FRUCTOSAMINE 277 05/28/2017     Lab on 03/06/2018  Component Date Value Ref Range Status  . Testosterone 03/06/2018 170.91* 300.00 - 890.00 ng/dL Final  . Free T4 03/06/2018 0.86  0.60 - 1.60 ng/dL Final   Comment: Specimens from patients who are undergoing biotin therapy and /or ingesting biotin supplements may contain high levels of biotin.  The higher biotin concentration in these specimens interferes with this Free T4 assay.  Specimens that contain high levels  of biotin may cause false high results for this Free T4 assay.  Please interpret results in light of the total clinical presentation of the patient.    Marland Kitchen TSH 03/06/2018  2.05  0.35 - 4.50 uIU/mL Final  . Glucose, Bld 03/06/2018 169* 70 - 99 mg/dL Final  . Hgb A1c MFr Bld 03/06/2018 7.5* 4.6 - 6.5 % Final   Glycemic Control Guidelines for People with Diabetes:Non Diabetic:  <6%Goal of Therapy: <7%Additional Action Suggested:  >8%   Hospital Outpatient Visit on 03/05/2018  Component Date Value Ref Range Status  . Sodium 03/05/2018 139  135 - 145 mmol/L Final  . Potassium 03/05/2018 4.9  3.5 - 5.1 mmol/L Final  . Chloride 03/05/2018 109  101 - 111 mmol/L Final  . CO2 03/05/2018 22  22 - 32 mmol/L Final  . Glucose, Bld 03/05/2018 193* 65 - 99 mg/dL Final  . BUN 03/05/2018 22* 6 - 20 mg/dL Final  . Creatinine, Ser 03/05/2018 1.75* 0.61 - 1.24 mg/dL Final  . Calcium 03/05/2018 9.2  8.9 - 10.3 mg/dL Final  . GFR calc non Af Amer 03/05/2018 41* >60 mL/min Final  . GFR calc Af Amer 03/05/2018 48* >60 mL/min Final   Comment: (NOTE) The eGFR has been calculated using the CKD EPI equation. This calculation has not been validated in all clinical situations. eGFR's persistently <60 mL/min signify possible Chronic Kidney Disease.   Georgiann Hahn gap 03/05/2018 8  5 - 15 Final   Performed at [facility], [place] [address] [place], [place] [address]  . BK Quantitaion PCR 03/05/2018 Negative  Negative copies/mL Final   Comment: (NOTE) No BK DNA detected. The quantitative range of this assay is 200 to 10 million copies/mL. This test was developed and its performance characteristics determined by [facility].  It has not been cleared or approved by the Food and Drug Administration.  The FDA has determined that such clearance or approval is not necessary. Performed At: [facility] [place], [place] [id number] Rush Farmer MD YK:[id number]   . Log10 BK Qn PCR 03/05/2018 UNABLE TO CALCULATE  log10copy/mL Final   Comment: (NOTE) Unable to calculate result since non-numeric result obtained for component test. Performed at [facility],  [place] [address], [place], [place] [address]   . WBC 03/05/2018 7.5  4.0 - 10.5 K/uL Final  . RBC 03/05/2018 4.81  4.22 - 5.81 MIL/uL Final  . Hemoglobin 03/05/2018 13.3  13.0 - 17.0 g/dL Final  . HCT 03/05/2018 41.4  39.0 - 52.0 % Final  . MCV 03/05/2018 86.1  78.0 - 100.0 fL Final  . MCH 03/05/2018 27.7  26.0 - 34.0 pg Final  . MCHC 03/05/2018 32.1  30.0 - 36.0 g/dL Final  . RDW 03/05/2018 14.1  11.5 - 15.5 % Final  . Platelets 03/05/2018 218  150 -  400 K/uL Final   Performed at [facility], [place] [address] [place], [place] [address]  . CMV DNA Quant 03/05/2018 Negative  Negative IU/mL Final   Comment: (NOTE) No CMV DNA detected. The quantitative range of this assay is 200 to 1 million IU/mL. This test was developed and its performance characteristics determined by [facility]. It has not been cleared or approved by the Food and Drug Administration. The FDA has determined that such clearance or approval is not necessary. Performed At: [facility] [place], [place] [id number] Rush Farmer MD BJ:[id number]   . Log10 CMV Qn DNA Pl 03/05/2018 UNABLE TO CALCULATE  log10 IU/mL Final   Comment: (NOTE) Unable to calculate result since non-numeric result obtained for component test. Performed at [facility], [place] [address] [place], [place] [address]   . Creatinine, Urine 03/05/2018 130.50  mg/dL Final  . Total Protein, Urine 03/05/2018 178  mg/dL Final   Comment: NO NORMAL RANGE ESTABLISHED FOR THIS TEST RESULTS CONFIRMED BY MANUAL DILUTION   . Protein Creatinine Ratio 03/05/2018 1.36* 0.00 - 0.15 mg/mg[Cre] Final   Performed at [facility], [place] [address] [place], [place] [address]    OTHER problems: See review of systems    Allergies as of 03/11/2018   No Known Allergies     Medication List        Accurate as of 03/11/18  9:00 AM. Always use your most recent med list.          acetaminophen 500 MG tablet Commonly known as:   TYLENOL Take 1,000 mg by mouth every 12 (twelve) hours as needed for mild pain.   amLODipine 5 MG tablet Commonly known as:  NORVASC Take 10 mg by mouth daily.   dextromethorphan-guaiFENesin 30-600 MG 12hr tablet Commonly known as:  MUCINEX DM Take 1 tablet by mouth 2 (two) times daily.   DULoxetine 30 MG capsule Commonly known as:  CYMBALTA Take 30 mg by mouth daily.   fluocinonide cream 0.05 % Commonly known as:  LIDEX Apply 1 application topically daily as needed (Skin inflammation (hand, thigh, back)).   furosemide 20 MG tablet Commonly known as:  LASIX Take 40 mg by mouth daily.   gabapentin 100 MG capsule Commonly known as:  NEURONTIN Take 200-300 mg by mouth See admin instructions. 200 mg in the morning and 300 mg at bedtime   glucose blood test strip Commonly known as:  ONETOUCH VERIO Use to check blood sugars up to 6 times daily   HUMULIN R 500 UNIT/ML injection Generic drug:  insulin regular human CONCENTRATED Using a U-100 1cc/31G/5/16" INSULIN SYRINGE, draw up and inject 5-18 units into the skin three times a day 30 minutes prior to each meal   Insulin Pen Needle 31G X 5 MM Misc Commonly known as:  B-D UF III MINI PEN NEEDLES USE 4 PER DAY TO INJECT INSULIN.   Insulin Syringe-Needle U-100 31G X 5/16" 0.3 ML Misc Commonly known as:  BD INSULIN SYRINGE U/F Use to inject insulin 2-3 times per day   levothyroxine 100 MCG tablet Commonly known as:  SYNTHROID, LEVOTHROID TAKE 1 TABLET BY MOUTH EVERY DAY   lisinopril 5 MG tablet Commonly known as:  PRINIVIL,ZESTRIL Take 5 mg by mouth daily.   metoprolol tartrate 50 MG tablet Commonly known as:  LOPRESSOR TAKE 1 TABLET BY MOUTH TWICE A DAY   mycophenolate 250 MG capsule Commonly known as:  CELLCEPT Take 1,000 mg by mouth  every 12 (twelve) hours.   NULOJIX 250 MG Solr injection Generic drug:  belatacept Inject 24.5 mLs into the vein every 28 (twenty-eight) days.   ONETOUCH DELICA LANCETS FINE  Misc Use to check blood sugars 4-6 times daily   polyethylene glycol packet Commonly known as:  MIRALAX / GLYCOLAX Take 17 g by mouth daily as needed for mild constipation.   predniSONE 5 MG tablet Commonly known as:  DELTASONE Take 5 mg by mouth daily with breakfast.   rosuvastatin 10 MG tablet Commonly known as:  CRESTOR Take 1 tablet (10 mg total) by mouth daily.   senna-docusate 8.6-50 MG tablet Commonly known as:  Senokot-S Take 1 tablet by mouth as needed for mild constipation.   tamsulosin 0.4 MG Caps capsule Commonly known as:  FLOMAX Take 0.8 mg by mouth at bedtime.   Testosterone 20.25 MG/ACT (1.62%) Gel Commonly known as:  ANDROGEL PUMP Apply 1 pump to each shoulder daily   traMADol 50 MG tablet Commonly known as:  ULTRAM Take 1 tablet (50 mg total) by mouth every 6 (six) hours as needed.   traZODone 50 MG tablet Commonly known as:  DESYREL Take 50-100 mg by mouth at bedtime as needed for sleep.   TRESIBA FLEXTOUCH 100 UNIT/ML Sopn FlexTouch Pen Generic drug:  insulin degludec INJECT 30 UNITS INTO THE SKIN EVERY MORNING   UNABLE TO FIND BiPAP: At bedtime   zolpidem 10 MG tablet Commonly known as:  AMBIEN Take 10-20 mg by mouth at bedtime as needed for sleep.       Allergies: No Known Allergies  Past Medical History:  Diagnosis Date  . Anemia    low hgb at present  . Arthritis    FEET  . Blood transfusion without reported diagnosis 1974   kidney removed - L  . Cancer ([place])    skin ca right shoulder, plastic dsyplasia, pre-Ca polpys removed on Colonoscopy- 07/2014  . Colon polyps 07/23/2014   Tubular adenomas x 5  . Constipation   . Diabetes mellitus without complication (HCC)    Type 2  . Difficult intubation    unsure of actual problem but it was during the January 28, 2015 procedure.  Marland Kitchen Dysrhythmia   . Eczema   . ESRD (end stage renal disease) ([place])    hEMO mwf  . Headache(784.0)    slight dull h/a due kidney failure  .  Hyperlipidemia   . Hypertension    SVT h/o , followed by Dr. Claiborne Billings  . Hypothyroidism   . Macular degeneration   . Neuropathy    right hand and both feet  . PAT (paroxysmal atrial tachycardia) ([place])   . PSVT (paroxysmal supraventricular tachycardia) ([place])   . SBO (small bowel obstruction) ([place])   . Shortness of breath    with exertion  . Sleep apnea 06/18/11   split-night sleep study- [facility]., BiPAP- 13-16  . Thyroid disease   . Umbilical hernia    will repair with thi surgery    Past Surgical History:  Procedure Laterality Date  . AV FISTULA PLACEMENT Right 02/25/2015   Procedure: RIGHT ARTERIOVENOUS (AV) FISTULA CREATION;  Surgeon: Serafina Mitchell, MD;  Location: [place];  Service: Vascular;  Laterality: Right;  . AV FISTULA PLACEMENT Right 09/15/2015   Procedure: ARTERIOVENOUS (AV) FISTULA CREATION- RIGHT ARM;  Surgeon: Serafina Mitchell, MD;  Location: [place];  Service: Vascular;  Laterality: Right;  . CAPD INSERTION N/A 05/18/2014   Procedure: LAPAROSCOPIC INSERTION CONTINUOUS  AMBULATORY PERITONEAL DIALYSIS  (CAPD) CATHETER;  Surgeon: Ralene Ok, MD;  Location: [place];  Service: General;  Laterality: N/A;  . COLONOSCOPY    . COLONOSCOPY WITH PROPOFOL N/A 07/12/2016   Procedure: COLONOSCOPY WITH PROPOFOL;  Surgeon: [name], MD;  [facility];  Service: Endoscopy;  Laterality: N/A;  . declotting of fistula  08/2015  . ESOPHAGOGASTRODUODENOSCOPY (EGD) WITH PROPOFOL N/A 07/12/2016   Procedure: ESOPHAGOGASTRODUODENOSCOPY (EGD) WITH PROPOFOL;  Surgeon: [name], MD;  [facility];  Service: Endoscopy;  Laterality: N/A;  . FISTULA SUPERFICIALIZATION Right 02/07/2016   Procedure: RIIGHT UPPER ARM FISTULA SUPERFICIALIZATION;  Surgeon: Elam Dutch, MD;  Location: [place];  Service: Vascular;  Laterality: Right;  . IJ catheter insertion    . INSERTION OF DIALYSIS CATHETER Right 02/25/2015   Procedure: INSERTION OF RIGHT INTERNAL  JUGULAR DIALYSIS CATHETER;  Surgeon: Serafina Mitchell, MD;  Location: [place];  Service: Vascular;  Laterality: Right;  . KIDNEY TRANSPLANT    . LAPAROSCOPIC REPOSITIONING CAPD CATHETER N/A 06/16/2014   Procedure: LAPAROSCOPIC REPOSITIONING CAPD CATHETER;  Surgeon: Ralene Ok, MD;  Location: [place];  Service: General;  Laterality: N/A;  . LAPAROSCOPY N/A 05/04/2015   Procedure: LAPAROSCOPY DIAGNOSTIC LYSIS OF ADHESIONS;  Surgeon: Coralie Keens, MD;  Location: [place];  Service: General;  Laterality: N/A;  . MINOR REMOVAL OF PERITONEAL DIALYSIS CATHETER N/A 04/28/2015   Procedure:  REMOVAL OF PERITONEAL DIALYSIS CATHETER;  Surgeon: Coralie Keens, MD;  Location: [place];  Service: General;  Laterality: N/A;  . NEPHRECTOMY Left 1974  . RENAL BIOPSY Right 2012  . REVISON OF ARTERIOVENOUS FISTULA Right 05/26/2015   Procedure: SUPERFICIALIZATION OF ARTERIOVENOUS FISTULA WITH SIDE BRANCH LIGATIONS;  Surgeon: Serafina Mitchell, MD;  Location: [place];  Service: Vascular;  Laterality: Right;  . SKIN CANCER EXCISION     right shoulder  . SVT ABLATION N/A 11/23/2016   Procedure: SVT Ablation;  Surgeon: Will Meredith Leeds, MD;  Location: [place] [facility];  Service: Cardiovascular;  Laterality: N/A;  . UMBILICAL HERNIA REPAIR N/A 05/18/2014   Procedure: HERNIA REPAIR UMBILICAL ADULT;  Surgeon: Ralene Ok, MD;  Location: [place];  Service: General;  Laterality: N/A;  . UMBILICAL HERNIA REPAIR N/A 04/28/2015   Procedure: UMBILICAL HERNIA REPAIR WITH MESH;  Surgeon: Coralie Keens, MD;  Location[facility];  Service: General;  Laterality: N/A;    Family History  Problem Relation Age of Onset  . Colon polyps Father   . Colon cancer Neg Hx   . Stomach cancer Neg Hx     Social History:  reports that he quit smoking about 5 years ago. His smoking use included cigars. He has never used smokeless tobacco. He reports that he drinks alcohol. He reports that he does not use drugs.  Review of  Systems:   Hypertension:    His blood pressure has been managed by his nephrologist and transplant team Also monitors at home  BP Readings from Last 3 Encounters:  03/11/18 128/72  03/05/18 136/76  02/14/18 106/67     Lipids:  His LDL is controlled with 10 mg Crestor Has no evidence of vascular disease    Lab Results  Component Value Date   CHOL 137 05/28/2017   CHOL 130 11/13/2016   CHOL 147 06/14/2016   Lab Results  Component Value Date   HDL 49.60 05/28/2017   HDL 41.40 11/13/2016   HDL 36.70 (L) 06/14/2016   Lab Results  Component Value Date  LDLCALC 73 05/28/2017   LDLCALC 68 11/13/2016   LDLCALC 87 06/14/2016   Lab Results  Component Value Date   TRIG 71.0 05/28/2017   TRIG 102.0 11/13/2016   TRIG 119.0 06/14/2016   Lab Results  Component Value Date   CHOLHDL 3 05/28/2017   CHOLHDL 3 11/13/2016   CHOLHDL 4 06/14/2016   No results found for: LDLDIRECT   HYPOTHYROIDISM: He has had long-standing hypothyroidism  Has been taking his 100 g levothyroxine before breakfast consistently  TSH normal  Lab Results  Component Value Date   TSH 2.05 03/06/2018       HYPOGONADISM: 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 metabolic syndrome and previously had subjectively improved with supplementation using Fortesta  He was having fatigue and decreased motivation and because of his free testosterone level being low at 3.7 he was recommended starting AndroGel in February 2019 Although he had initial difficulties with insurance coverage he has been using the AndroGel for at least a month now, probably more However instead of applying this on his arm he is applying this on the lateral abdomen, 1 pump on each side  He continues to have fatigue and his total level is lower than before   Lab Results  Component Value Date   TESTOSTERONE 170.91 (L) 03/06/2018   TESTOSTERONE 273 12/04/2017   TESTOSTERONE 239.62 (L) 09/19/2017       Diabetic neuropathy: Has mild sensory loss on his last exam      Examination:   BP 128/72 (BP Location: Left Arm, Patient Position: Sitting, Cuff Size: Normal)   Pulse 71   Ht '5\' 11"'$  (1.803 m)   Wt 298 lb 9.6 oz (135.4 kg)   SpO2 99%   BMI 41.65 kg/m   Body mass index is 41.65 kg/m.    ASSESSMENT/ PLAN:    Diabetes type 2 with obesity, insulin-requiring:   See history of present illness for detailed discussion of his current management, problems identified and sugar patterns lately  Blood sugars are still fluctuating significantly including fasting as discussed above  His A1c has gone up to 7.5  His greatest challenge is getting consistent diet and coverage for his meals and snacks Also not exercising  Recommendations:  Discussed benefits of GLP-1 drugs and since he has had benefit from Victoza in the past without major side effects he agrees to try this again  He can try 0.6 mg but after a week or 2 he can increase it to 1.2 if he is not finding this improving his blood sugars especially postprandial or improving his satiety significantly  He will need to cut back on snacks at night  If he does have snacks at night he will need to cover the carbohydrates with 1: 5 ratio also  No change in [name] unless morning sugars start getting relatively low or been consistently high also  He needs to start walking during lunchtime  Try to take mealtime insulin 30 units before eating   Follow-up in 2 months  HYPOGONADISM: He does have fatigue and a decreased motivation and although he is using AndroGel his level is relatively low Not clear if this is because of inadequate dose or whether he needs to apply it on the shoulder instead of the abdomen which he is doing  For now will increase his AndroGel to 3 pumps a day and also have him use it on the shoulder, discussed needing to make sure it is applied properly and allow it to dry  before dressing   HYPOTHYROIDISM:  Adequately replaced  There are no Patient Instructions on file for this visit.  Counseling time on subjects discussed in the diabetes evaluation and management sections is over 50% of today's 25 minute visit    Elayne Snare 03/11/2018, 9:00 AM

## 2018-03-12 ENCOUNTER — Other Ambulatory Visit: Payer: Self-pay | Admitting: Endocrinology

## 2018-03-12 MED ORDER — TESTOSTERONE 20.25 MG/ACT (1.62%) TD GEL: TRANSDERMAL | 2 refills | Status: DC

## 2018-04-01 ENCOUNTER — Other Ambulatory Visit (HOSPITAL_COMMUNITY): Payer: Self-pay

## 2018-04-02 ENCOUNTER — Ambulatory Visit (HOSPITAL_COMMUNITY)
Admission: RE | Admit: 2018-04-02 | Discharge: 2018-04-02 | Disposition: A | Discharge status: Home / Self Care | Payer: Medicare Other | Source: Ambulatory Visit | Attending: Internal Medicine | Admitting: Internal Medicine

## 2018-04-02 DIAGNOSIS — Z940 Kidney transplant status: Secondary | ICD-10-CM | POA: Insufficient documentation

## 2018-04-02 LAB — BASIC METABOLIC PANEL
ANION GAP: 8 (ref 5–15)
BUN: 27 mg/dL — ABNORMAL HIGH (ref 6–20)
CHLORIDE: 104 mmol/L (ref 98–111)
CO2: 24 mmol/L (ref 22–32)
Calcium: 9.1 mg/dL (ref 8.9–10.3)
Creatinine, Ser: 1.79 mg/dL — ABNORMAL HIGH (ref 0.61–1.24)
GFR calc Af Amer: 46 mL/min — ABNORMAL LOW (ref >60)
GFR calc non Af Amer: 40 mL/min — ABNORMAL LOW (ref >60)
Glucose, Bld: 237 mg/dL — ABNORMAL HIGH (ref 70–99)
POTASSIUM: 4.3 mmol/L (ref 3.5–5.1)
Sodium: 136 mmol/L (ref 135–145)

## 2018-04-02 LAB — URINALYSIS, COMPLETE (UACMP) WITH MICROSCOPIC
Bilirubin Urine: NEGATIVE
GLUCOSE, UA: 150 mg/dL — AB
KETONES UR: NEGATIVE mg/dL
LEUKOCYTES UA: NEGATIVE
NITRITE: NEGATIVE
PROTEIN: 100 mg/dL — AB
Specific Gravity, Urine: 1.018 (ref 1.005–1.030)
pH: 5 (ref 5.0–8.0)

## 2018-04-02 LAB — CBC
HEMATOCRIT: 43.6 % (ref 39.0–52.0)
HEMOGLOBIN: 13.8 g/dL (ref 13.0–17.0)
MCH: 27.9 pg (ref 26.0–34.0)
MCHC: 31.7 g/dL (ref 30.0–36.0)
MCV: 88.3 fL (ref 78.0–100.0)
Platelets: 228 10*3/uL (ref 150–400)
RBC: 4.94 MIL/uL (ref 4.22–5.81)
RDW: 14 % (ref 11.5–15.5)
WBC: 7.2 10*3/uL (ref 4.0–10.5)

## 2018-04-02 LAB — PROTEIN / CREATININE RATIO, URINE
Creatinine, Urine: 104.64 mg/dL
Protein Creatinine Ratio: 1.72 mg/mg{Cre} — ABNORMAL HIGH (ref 0.00–0.15)
Total Protein, Urine: 180 mg/dL

## 2018-04-02 MED ORDER — SODIUM CHLORIDE 0.9 % IV SOLN
5.0000 mg/kg | INTRAVENOUS | Status: DC
  Administered 2018-04-02: 687.5 mg via INTRAVENOUS
  Filled 2018-04-02: qty 687.5

## 2018-04-03 LAB — BK QUANT PCR (PLASMA/SERUM)
BK Quantitaion PCR: NEGATIVE copies/mL
Log10 BK Qn PCR: UNDETERMINED log10copy/mL

## 2018-04-03 LAB — CMV DNA, QUANTITATIVE, PCR
CMV DNA Quant: NEGATIVE IU/mL
Log10 CMV Qn DNA Pl: UNDETERMINED log10 IU/mL

## 2018-04-04 ENCOUNTER — Other Ambulatory Visit: Payer: Self-pay | Admitting: Endocrinology

## 2018-04-29 ENCOUNTER — Other Ambulatory Visit (HOSPITAL_COMMUNITY): Payer: Self-pay

## 2018-04-30 ENCOUNTER — Ambulatory Visit (HOSPITAL_COMMUNITY)
Admission: RE | Admit: 2018-04-30 | Discharge: 2018-04-30 | Disposition: A | Discharge status: Home / Self Care | Payer: Medicare Other | Source: Ambulatory Visit | Attending: Internal Medicine | Admitting: Internal Medicine

## 2018-04-30 DIAGNOSIS — Z940 Kidney transplant status: Secondary | ICD-10-CM | POA: Insufficient documentation

## 2018-04-30 LAB — BASIC METABOLIC PANEL
Anion gap: 8 (ref 5–15)
BUN: 24 mg/dL — AB (ref 6–20)
CO2: 22 mmol/L (ref 22–32)
Calcium: 9.3 mg/dL (ref 8.9–10.3)
Chloride: 111 mmol/L (ref 98–111)
Creatinine, Ser: 1.69 mg/dL — ABNORMAL HIGH (ref 0.61–1.24)
GFR calc Af Amer: 50 mL/min — ABNORMAL LOW (ref >60)
GFR, EST NON AFRICAN AMERICAN: 43 mL/min — AB (ref >60)
GLUCOSE: 263 mg/dL — AB (ref 70–99)
POTASSIUM: 4 mmol/L (ref 3.5–5.1)
Sodium: 141 mmol/L (ref 135–145)

## 2018-04-30 LAB — CBC
HCT: 44.4 % (ref 39.0–52.0)
HEMOGLOBIN: 14.2 g/dL (ref 13.0–17.0)
MCH: 28.3 pg (ref 26.0–34.0)
MCHC: 32 g/dL (ref 30.0–36.0)
MCV: 88.4 fL (ref 78.0–100.0)
Platelets: 203 10*3/uL (ref 150–400)
RBC: 5.02 MIL/uL (ref 4.22–5.81)
RDW: 14 % (ref 11.5–15.5)
WBC: 6.4 10*3/uL (ref 4.0–10.5)

## 2018-04-30 LAB — URINALYSIS, ROUTINE W REFLEX MICROSCOPIC
BACTERIA UA: NONE SEEN
Bilirubin Urine: NEGATIVE
Glucose, UA: >=500 mg/dL — AB
KETONES UR: NEGATIVE mg/dL
LEUKOCYTES UA: NEGATIVE
NITRITE: NEGATIVE
PH: 5 (ref 5.0–8.0)
Specific Gravity, Urine: 1.021 (ref 1.005–1.030)

## 2018-04-30 LAB — PROTEIN / CREATININE RATIO, URINE
Creatinine, Urine: 140.6 mg/dL
Protein Creatinine Ratio: 2.28 mg/mg{Cre} — ABNORMAL HIGH (ref 0.00–0.15)
TOTAL PROTEIN, URINE: 320 mg/dL

## 2018-04-30 MED ORDER — SODIUM CHLORIDE 0.9 % IV SOLN
5.0000 mg/kg | INTRAVENOUS | Status: DC
  Administered 2018-04-30: 687.5 mg via INTRAVENOUS
  Filled 2018-04-30: qty 687.5

## 2018-05-01 LAB — BK QUANT PCR (PLASMA/SERUM)
BK Quantitaion PCR: NEGATIVE copies/mL
Log10 BK Qn PCR: UNDETERMINED log10copy/mL

## 2018-05-01 LAB — CMV DNA, QUANTITATIVE, PCR
CMV DNA QUANT: NEGATIVE [IU]/mL
LOG10 CMV QN DNA PL: UNDETERMINED {Log_IU}/mL

## 2018-05-08 ENCOUNTER — Other Ambulatory Visit (INDEPENDENT_AMBULATORY_CARE_PROVIDER_SITE_OTHER): Payer: Medicare Other

## 2018-05-08 DIAGNOSIS — E1165 Type 2 diabetes mellitus with hyperglycemia: Secondary | ICD-10-CM

## 2018-05-08 DIAGNOSIS — E230 Hypopituitarism: Secondary | ICD-10-CM

## 2018-05-08 DIAGNOSIS — Z794 Long term (current) use of insulin: Secondary | ICD-10-CM | POA: Diagnosis not present

## 2018-05-08 LAB — TESTOSTERONE: TESTOSTERONE: 326.83 ng/dL (ref 300.00–890.00)

## 2018-05-08 LAB — GLUCOSE, RANDOM: Glucose, Bld: 197 mg/dL — ABNORMAL HIGH (ref 70–99)

## 2018-05-09 LAB — FRUCTOSAMINE: Fructosamine: 284 umol/L (ref 0–285)

## 2018-05-14 ENCOUNTER — Ambulatory Visit (INDEPENDENT_AMBULATORY_CARE_PROVIDER_SITE_OTHER): Payer: Medicare Other | Admitting: Endocrinology

## 2018-05-14 ENCOUNTER — Encounter: Payer: Self-pay | Admitting: Endocrinology

## 2018-05-14 VITALS — BP 112/62 | HR 74 | Ht 71.0 in | Wt 313.0 lb

## 2018-05-14 DIAGNOSIS — E039 Hypothyroidism, unspecified: Secondary | ICD-10-CM | POA: Diagnosis not present

## 2018-05-14 DIAGNOSIS — E1165 Type 2 diabetes mellitus with hyperglycemia: Secondary | ICD-10-CM | POA: Diagnosis not present

## 2018-05-14 DIAGNOSIS — E782 Mixed hyperlipidemia: Secondary | ICD-10-CM

## 2018-05-14 DIAGNOSIS — E291 Testicular hypofunction: Secondary | ICD-10-CM

## 2018-05-14 DIAGNOSIS — Z794 Long term (current) use of insulin: Secondary | ICD-10-CM

## 2018-05-14 NOTE — Progress Notes (Signed)
Patient ID: Robert [name], male   DOB: 12/16/1960, 57 y.o.   MRN: [id number]   Reason for Appointment: Endocrinology follow-up  History of Present Illness   DIABETES diagnosis date:  2012  Previous history: His blood sugar has been difficult to control since onset. Initially was taking oral hypoglycemic drugs like glyburide but could not take metformin because of renal dysfunction. Since Victoza did not help his control he was started on Lantus and then U 500 insulin also His A1c has previously ranged from 7.8-9.8, the lowest level in 03/2013 He was on Victoza but because of his markedly decreased appetite and weight loss this was stopped in 9/15  Recent history:   Insulin regimen: Tresiba 18-20 units daily., Humulin U-500 before meals 1: 5 carbohydrate ratio, usually 20 in the morning, 10 at lunch and 10/20 at dinner   His A1c is trending higher and was 7.5 compared to 7% before Last fructosamine was 287 and is now 284  Current blood sugar patterns, management and problems identified:  He appears to be taking relatively higher doses of Tresiba Even though his blood sugars have been as low as 74 fasting  However most of his high blood sugars appear to be in the late afternoon even though he is not eating much at lunchtime  He says that he mostly has a food cocktail and some cheese around lunchtime as he is not eating a big meal but not clear what his blood sugars can be as high as 407 in the afternoon  However he appears to be having only a couple of high readings after his evening meal when he takes most of his insulin  Some of his high readings are because of taking his insulin AFTER eating since 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          Monitors blood glucose:  recently 2.1 times a day .    Glucometer: One [employer].          Blood Glucose readings by download  Mean values apply above for all meters except median for One Touch  PRE-MEAL Fasting Lunch Dinner Bedtime Overall  Glucose range:  74-257      Mean/median:  178  201    202+/-78   POST-MEAL PC Breakfast PC Lunch PC Dinner  Glucose range:   202-407  135-236  Mean/median:   290       Meals: 2-3 meals per day. 7-8 am and Noon and Dinner 5-7 PM .        Physical activity: exercise: not much       Weight control:  Wt Readings from Last 3 Encounters:  05/14/18 (!) 313 lb (142 kg)  04/30/18 296 lb (134.3 kg)  04/02/18 (!) 302 lb 14.6 oz (678.9 kg)        Complications:   nephropathy,  neuropathy   Diabetes labs:   Lab Results  Component Value Date   HGBA1C 7.5 (H) 03/06/2018   HGBA1C 7.0 (H) 09/19/2017   HGBA1C 6.3 05/28/2017   Lab Results  Component Value Date   MICROALBUR 224.5 Repeated and verified X2. (H) 01/04/2014   LDLCALC 73 05/28/2017   CREATININE 1.69 (H) 04/30/2018    Lab Results  Component Value Date   FRUCTOSAMINE 284 05/08/2018   FRUCTOSAMINE 287 (H) 12/04/2017   FRUCTOSAMINE 279 (H) 09/19/2017     Lab on 05/08/2018  Component Date Value Ref Range Status  . Testosterone 05/08/2018 326.83  300.00 - 890.00 ng/dL Final  . Fructosamine 05/08/2018 284  0 - 285 umol/L Final   Comment: Published reference interval for apparently healthy subjects between age 47 and 37 is 80 - 285 umol/L and in a poorly controlled diabetic population is 228 - 563 umol/L with a mean of 396  umol/L.   Marland Kitchen Glucose, Bld 05/08/2018 197* 70 - 99 mg/dL Final    OTHER problems: See review of systems    Allergies as of 05/14/2018   No Known Allergies     Medication List        Accurate as of 05/14/18 11:59 PM. Always use your most recent med list.          acetaminophen 500 MG tablet Commonly known as:  TYLENOL Take 1,000 mg by mouth every 12 (twelve) hours as needed for mild pain.   amLODipine 5 MG tablet Commonly known as:  NORVASC Take 10 mg by mouth daily.   DULoxetine 30 MG capsule Commonly known as:  CYMBALTA Take 30 mg by mouth daily.   fluocinonide cream 0.05 % Commonly known as:  LIDEX Apply 1 application topically daily as needed (Skin inflammation (hand, thigh, back)).   furosemide 20 MG tablet Commonly known as:  LASIX Take 40 mg by mouth daily.   gabapentin 100 MG capsule Commonly known as:  NEURONTIN Take 200-300 mg by mouth See admin instructions. 200 mg in the morning and 300 mg at bedtime   glucose blood test strip Use to check blood sugars up to 6 times daily   HUMULIN R 500 UNIT/ML injection Generic drug:  insulin regular human CONCENTRATED Using a U-100 1cc/31G/5/16" INSULIN SYRINGE, draw up and inject 5-18 units into the skin three times a day 30 minutes prior to each meal   Insulin Pen Needle 31G X 5 MM Misc USE 4 PER DAY TO INJECT INSULIN.   Insulin Syringe-Needle U-100 31G X 5/16" 0.3 ML Misc Use to inject insulin 2-3 times per day   levothyroxine 100 MCG tablet Commonly known as:  SYNTHROID, LEVOTHROID TAKE 1 TABLET BY MOUTH EVERY DAY   lisinopril 5 MG tablet Commonly known as:  PRINIVIL,ZESTRIL Take 5 mg by mouth daily.   metoprolol tartrate 50 MG tablet Commonly known as:  LOPRESSOR TAKE 1 TABLET BY MOUTH TWICE A DAY   mycophenolate 250 MG capsule Commonly known as:  CELLCEPT Take 1,000 mg by mouth every 12 (twelve) hours.   NULOJIX 250 MG Solr injection Generic drug:  belatacept Inject 24.5 mLs into the vein every  28 (twenty-eight) days.   ONETOUCH DELICA LANCETS FINE Misc Use to check blood sugars 4-6 times daily   predniSONE 5 MG tablet Commonly known as:  DELTASONE Take 5 mg by mouth daily with breakfast.   rosuvastatin 10 MG tablet Commonly known as:  CRESTOR TAKE 1 TABLET BY MOUTH EVERY DAY   senna-docusate 8.6-50 MG  tablet Commonly known as:  Senokot-S Take 1 tablet by mouth as needed for mild constipation.   tamsulosin 0.4 MG Caps capsule Commonly known as:  FLOMAX Take 0.8 mg by mouth at bedtime.   Testosterone 20.25 MG/ACT (1.62%) Gel Apply 3 pumps every morning on shoulder area   traMADol 50 MG tablet Commonly known as:  ULTRAM Take 1 tablet (50 mg total) by mouth every 6 (six) hours as needed.   traZODone 50 MG tablet Commonly known as:  DESYREL Take 50-100 mg by mouth at bedtime as needed for sleep.   TRESIBA FLEXTOUCH 100 UNIT/ML Sopn FlexTouch Pen Generic drug:  insulin degludec INJECT 30 UNITS INTO THE SKIN EVERY MORNING   UNABLE TO FIND BiPAP: At bedtime   VICTOZA 18 MG/3ML Sopn Generic drug:  liraglutide Inject 0.2 mLs (1.2 mg total) into the skin daily. Inject once daily at the same time   zolpidem 10 MG tablet Commonly known as:  AMBIEN Take 10-20 mg by mouth at bedtime as needed for sleep.       Allergies: No Known Allergies  Past Medical History:  Diagnosis Date  . Anemia    low hgb at present  . Arthritis    FEET  . Blood transfusion without reported diagnosis 1974   kidney removed - L  . Cancer ([place])    skin ca right shoulder, plastic dsyplasia, pre-Ca polpys removed on Colonoscopy- 07/2014  . Colon polyps 07/23/2014   Tubular adenomas x 5  . Constipation   . Diabetes mellitus without complication (HCC)    Type 2  . Difficult intubation    unsure of actual problem but it was during the January 28, 2015 procedure.  Marland Kitchen Dysrhythmia   . Eczema   . ESRD (end stage renal disease) ([place])    hEMO mwf  . Headache(784.0)    slight dull h/a due  kidney failure  . Hyperlipidemia   . Hypertension    SVT h/o , followed by Dr. Claiborne Billings  . Hypothyroidism   . Macular degeneration   . Neuropathy    right hand and both feet  . PAT (paroxysmal atrial tachycardia) ([place])   . PSVT (paroxysmal supraventricular tachycardia) ([place])   . SBO (small bowel obstruction) ([place])   . Shortness of breath    with exertion  . Sleep apnea 06/18/11   split-night sleep study- [facility]., BiPAP- 13-16  . Thyroid disease   . Umbilical hernia    will repair with thi surgery    Past Surgical History:  Procedure Laterality Date  . AV FISTULA PLACEMENT Right 02/25/2015   Procedure: RIGHT ARTERIOVENOUS (AV) FISTULA CREATION;  Surgeon: Serafina Mitchell, MD;  Location: [place];  Service: Vascular;  Laterality: Right;  . AV FISTULA PLACEMENT Right 09/15/2015   Procedure: ARTERIOVENOUS (AV) FISTULA CREATION- RIGHT ARM;  Surgeon: Serafina Mitchell, MD;  Location: [place];  Service: Vascular;  Laterality: Right;  . CAPD INSERTION N/A 05/18/2014   Procedure: LAPAROSCOPIC INSERTION CONTINUOUS AMBULATORY PERITONEAL DIALYSIS  (CAPD) CATHETER;  Surgeon: Ralene Ok, MD;  Location: [place];  Service: General;  Laterality: N/A;  . COLONOSCOPY    . COLONOSCOPY WITH PROPOFOL N/A 07/12/2016   Procedure: COLONOSCOPY WITH PROPOFOL;  Surgeon: [name], MD;  Location: [facility];  Service: Endoscopy;  Laterality: N/A;  . declotting of fistula  08/2015  . ESOPHAGOGASTRODUODENOSCOPY (EGD) WITH PROPOFOL N/A 07/12/2016   Procedure: ESOPHAGOGASTRODUODENOSCOPY (EGD) WITH PROPOFOL;  Surgeon: [name], MD;  [facility];  Service: Endoscopy;  Laterality: N/A;  . FISTULA SUPERFICIALIZATION Right 02/07/2016   Procedure: RIIGHT UPPER ARM FISTULA SUPERFICIALIZATION;  Surgeon: Elam Dutch, MD;  Location: [place];  Service: Vascular;  Laterality: Right;  . IJ catheter insertion    . INSERTION OF DIALYSIS CATHETER Right 02/25/2015   Procedure: INSERTION  OF RIGHT INTERNAL JUGULAR DIALYSIS CATHETER;  Surgeon: Serafina Mitchell, MD;  Location: [place];  Service: Vascular;  Laterality: Right;  . KIDNEY TRANSPLANT    . LAPAROSCOPIC REPOSITIONING CAPD CATHETER N/A 06/16/2014   Procedure: LAPAROSCOPIC REPOSITIONING CAPD CATHETER;  Surgeon: Ralene Ok, MD;  Location: [place];  Service: General;  Laterality: N/A;  . LAPAROSCOPY N/A 05/04/2015   Procedure: LAPAROSCOPY DIAGNOSTIC LYSIS OF ADHESIONS;  Surgeon: Coralie Keens, MD;  Location: [place];  Service: General;  Laterality: N/A;  . MINOR REMOVAL OF PERITONEAL DIALYSIS CATHETER N/A 04/28/2015   Procedure:  REMOVAL OF PERITONEAL DIALYSIS CATHETER;  Surgeon: Coralie Keens, MD;  Location: [place];  Service: General;  Laterality: N/A;  . NEPHRECTOMY Left 1974  . RENAL BIOPSY Right 2012  . REVISON OF ARTERIOVENOUS FISTULA Right 05/26/2015   Procedure: SUPERFICIALIZATION OF ARTERIOVENOUS FISTULA WITH SIDE BRANCH LIGATIONS;  Surgeon: Serafina Mitchell, MD;  Location: [place];  Service: Vascular;  Laterality: Right;  . SKIN CANCER EXCISION     right shoulder  . SVT ABLATION N/A 11/23/2016   Procedure: SVT Ablation;  Surgeon: Will Meredith Leeds, MD;  Location: [facility];  Service: Cardiovascular;  Laterality: N/A;  . UMBILICAL HERNIA REPAIR N/A 05/18/2014   Procedure: HERNIA REPAIR UMBILICAL ADULT;  Surgeon: Ralene Ok, MD;  Location: [place];  Service: General;  Laterality: N/A;  . UMBILICAL HERNIA REPAIR N/A 04/28/2015   Procedure: UMBILICAL HERNIA REPAIR WITH MESH;  Surgeon: Coralie Keens, MD;  [facility];  Service: General;  Laterality: N/A;    Family History  Problem Relation Age of Onset  . Colon polyps Father   . Colon cancer Neg Hx   . Stomach cancer Neg Hx     Social History:  reports that he quit smoking about 5 years ago. His smoking use included cigars. He has never used smokeless tobacco. He reports that he drinks alcohol. He reports that he does not use drugs.  Review of  Systems:   Hypertension:    His blood pressure has been managed by his nephrologist and transplant team Also monitors at home  BP Readings from Last 3 Encounters:  05/14/18 112/62  04/30/18 135/68  04/02/18 140/71     Lipids:  His LDL is controlled with 10 mg Crestor Has no evidence of vascular disease    Lab Results  Component Value Date   CHOL 137 05/28/2017   CHOL 130 11/13/2016   CHOL 147 06/14/2016   Lab Results  Component Value Date   HDL 49.60 05/28/2017   HDL 41.40 11/13/2016   HDL 36.70 (L) 06/14/2016   Lab Results  Component Value Date   LDLCALC 73 05/28/2017   LDLCALC 68 11/13/2016   LDLCALC 87 06/14/2016   Lab Results  Component Value Date   TRIG 71.0 05/28/2017   TRIG 102.0 11/13/2016   TRIG 119.0 06/14/2016   Lab Results  Component Value Date   CHOLHDL 3 05/28/2017   CHOLHDL 3 11/13/2016   CHOLHDL 4 06/14/2016   No results found for: LDLDIRECT   HYPOTHYROIDISM: He has had long-standing hypothyroidism  Has been taking his 100 g levothyroxine before breakfast consistently  TSH normal  Lab Results  Component Value Date   TSH 2.05 03/06/2018       HYPOGONADISM: 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 metabolic syndrome and previously had subjectively improved with supplementation using Fortesta  He was having fatigue and decreased motivation and because of his free testosterone level being low at 3.7 he was prescribed AndroGel in February 2019 Although he had initial difficulties with insurance coverage he has been using the AndroGel consistently now  However instead of applying a total of 3 pumps a day he is applying 3 pumps on each arm Although he does not complain of excessive fatigue recently he still tends to get tired However even with the higher dose of AndroGel his testosterone level is just over 300   Lab Results  Component Value Date   TESTOSTERONE 326.83 05/08/2018   TESTOSTERONE 170.91 (L)  03/06/2018   TESTOSTERONE 273 12/04/2017      Diabetic neuropathy: Has mild sensory loss on his last exam      Examination:   BP 112/62   Pulse 74   Ht 5\' 11"  (1.803 m)   Wt (!) 313 lb (142 kg)   SpO2 96%   BMI 43.65 kg/m   Body mass index is 43.65 kg/m.    ASSESSMENT/ PLAN:    Diabetes type 2 with obesity, insulin-requiring:   See history of present illness for detailed discussion of his current management, problems identified and sugar patterns lately  Blood sugars are still fluctuating significantly including fasting as discussed above  His A1c was 7.5  Although his fructosamine is not consistently high and is to 84 now his home blood sugars are averaging well over 200  As above he has consistent difficulties getting postprandial blood sugars under control but also appears to have significant hypoglycemia in the afternoon despite not eating any large meals He may not consistently take his mealtime insulin before eating including eating sometimes Also may not be getting consistent amounts at lunchtime He apparently could not tolerate Victoza and does not want to try it again Also not exercising or losing weight  Recommendations:  Discussed benefits of using an insulin pump and showed him the Omnipod which would be fairly convenient to use and simpler practically discussed benefits of using this including lower insulin doses and ability to bolus without injections which he is not doing consistently  He is going to look into the cost of this and information was faxed to the company for getting insurance approval  He will also look into the freestyle libre system that was discussed today but he is only concerned about the cost right now  For now he does need to increase his prelunch insulin dose by 5 to 10 units at least because of high readings in the afternoon  Discussed taking only a stable dose of [name] starting with 18 units unless morning sugars are  consistently higher or lower Encouraged him to start walking for exercise  Follow-up in 2 months  HYPOGONADISM: He appears to be needing relatively large doses of testosterone However since his levels have increased fairly rapidly and he is taking 6 pumps a day he can reduce the dose down to 5 pumps for now   Patient Instructions  Tresiba 18 daily  Have less carbs at lunch  Take at least 15 units at lunch probably 20 units  5 pumps on Androgel daily   Counseling time on subjects discussed in the diabetes evaluation and management sections is  over 50% of today's 25 minute visit    Elayne Snare 05/15/2018, 1:13 PM

## 2018-05-14 NOTE — Patient Instructions (Addendum)
Tresiba 18 daily  Have less carbs at lunch  Take at least 15 units at lunch probably 20 units  5 pumps on Androgel daily

## 2018-05-28 ENCOUNTER — Ambulatory Visit (INDEPENDENT_AMBULATORY_CARE_PROVIDER_SITE_OTHER): Payer: Medicare Other | Admitting: Podiatry

## 2018-05-28 ENCOUNTER — Encounter: Payer: Self-pay | Admitting: Podiatry

## 2018-05-28 DIAGNOSIS — B351 Tinea unguium: Secondary | ICD-10-CM | POA: Diagnosis not present

## 2018-05-28 DIAGNOSIS — E1149 Type 2 diabetes mellitus with other diabetic neurological complication: Secondary | ICD-10-CM | POA: Diagnosis not present

## 2018-05-28 DIAGNOSIS — M79675 Pain in left toe(s): Secondary | ICD-10-CM

## 2018-05-28 DIAGNOSIS — M79674 Pain in right toe(s): Secondary | ICD-10-CM | POA: Diagnosis not present

## 2018-05-28 NOTE — Progress Notes (Signed)
Patient ID: Robert [name], male   DOB: 01-09-1961, 57 y.o.   MRN: [id number] Complaint:  Visit Type: Patient returns to my office for continued preventative foot care services. Complaint: Patient states" my nails have grown long and thick and become painful to walk and wear shoes" Patient has been diagnosed with DM and had a kidney transplant right side.  This is leading to increased leg swelling left.  . The patient presents for preventative foot care services. No changes to ROS  Podiatric Exam: Vascular: dorsalis pedis and posterior tibial pulses are palpable bilateral. Capillary return is immediate. Temperature gradient is WNL. Skin turgor WNL  Sensorium: Diminished  Semmes Weinstein monofilament test. Normal tactile sensation bilaterally. Nail Exam: Pt has thick disfigured discolored nails with subungual debris noted bilateral entire nail hallux through fifth toenails Ulcer Exam: There is no evidence of ulcer or pre-ulcerative changes or infection. Orthopedic Exam: Muscle tone and strength are WNL. No limitations in general ROM. No crepitus or effusions noted. Foot type and digits show no abnormalities. Bony prominences are unremarkable. HAV  B/L Skin: No Porokeratosis. No infection or ulcers  Diagnosis:  Onychomycosis, , Pain in right toe, pain in left toes, Diabetic neuropathy  Hallux limitus 1st MPJ  B/L  Treatment & Plan Procedures and Treatment: Consent by patient was obtained for treatment procedures. The patient understood the discussion of treatment and procedures well. All questions were answered thoroughly reviewed. Debridement of mycotic and hypertrophic toenails, 1 through 5 bilateral and clearing of subungual debris. No ulceration, no infection noted. Signed ABN for 2019.  Return Visit-Office Procedure: Patient instructed to return to the office for a follow up visit 3 months for continued evaluation and treatment.    Gardiner Barefoot DPM

## 2018-06-04 ENCOUNTER — Ambulatory Visit (HOSPITAL_COMMUNITY)
Admission: RE | Admit: 2018-06-04 | Discharge: 2018-06-04 | Disposition: A | Discharge status: Home / Self Care | Payer: Medicare Other | Source: Ambulatory Visit | Attending: Internal Medicine | Admitting: Internal Medicine

## 2018-06-04 DIAGNOSIS — Z940 Kidney transplant status: Secondary | ICD-10-CM | POA: Diagnosis not present

## 2018-06-04 LAB — BASIC METABOLIC PANEL
Anion gap: 10 (ref 5–15)
BUN: 20 mg/dL (ref 6–20)
CO2: 23 mmol/L (ref 22–32)
CREATININE: 1.57 mg/dL — AB (ref 0.61–1.24)
Calcium: 9.3 mg/dL (ref 8.9–10.3)
Chloride: 105 mmol/L (ref 98–111)
GFR calc Af Amer: 54 mL/min — ABNORMAL LOW (ref >60)
GFR, EST NON AFRICAN AMERICAN: 47 mL/min — AB (ref >60)
Glucose, Bld: 327 mg/dL — ABNORMAL HIGH (ref 70–99)
Potassium: 4.2 mmol/L (ref 3.5–5.1)
SODIUM: 138 mmol/L (ref 135–145)

## 2018-06-04 LAB — CBC
HCT: 41.8 % (ref 39.0–52.0)
Hemoglobin: 13.5 g/dL (ref 13.0–17.0)
MCH: 28.3 pg (ref 26.0–34.0)
MCHC: 32.3 g/dL (ref 30.0–36.0)
MCV: 87.6 fL (ref 78.0–100.0)
PLATELETS: 211 10*3/uL (ref 150–400)
RBC: 4.77 MIL/uL (ref 4.22–5.81)
RDW: 13.7 % (ref 11.5–15.5)
WBC: 6.1 10*3/uL (ref 4.0–10.5)

## 2018-06-04 LAB — URINALYSIS, ROUTINE W REFLEX MICROSCOPIC
Bacteria, UA: NONE SEEN
Bilirubin Urine: NEGATIVE
Ketones, ur: NEGATIVE mg/dL
Leukocytes, UA: NEGATIVE
Nitrite: NEGATIVE
PH: 6 (ref 5.0–8.0)
Protein, ur: 100 mg/dL — AB
SPECIFIC GRAVITY, URINE: 1.015 (ref 1.005–1.030)

## 2018-06-04 LAB — PROTEIN / CREATININE RATIO, URINE
CREATININE, URINE: 87.53 mg/dL
PROTEIN CREATININE RATIO: 2.52 mg/mg{creat} — AB (ref 0.00–0.15)
Total Protein, Urine: 221 mg/dL

## 2018-06-04 MED ORDER — SODIUM CHLORIDE 0.9 % IV SOLN
5.0000 mg/kg | INTRAVENOUS | Status: DC
  Administered 2018-06-04: 687.5 mg via INTRAVENOUS
  Filled 2018-06-04: qty 687.5

## 2018-06-05 LAB — CMV DNA, QUANTITATIVE, PCR
CMV DNA Quant: NEGATIVE IU/mL
LOG10 CMV QN DNA PL: UNDETERMINED {Log_IU}/mL

## 2018-06-05 LAB — BK QUANT PCR (PLASMA/SERUM)
BK QUANTITATION PCR: NEGATIVE {copies}/mL
Log10 BK Qn PCR: UNDETERMINED log10copy/mL

## 2018-06-14 ENCOUNTER — Other Ambulatory Visit: Payer: Self-pay | Admitting: Endocrinology

## 2018-06-27 DIAGNOSIS — I10 Essential (primary) hypertension: Secondary | ICD-10-CM | POA: Diagnosis not present

## 2018-06-27 DIAGNOSIS — Z23 Encounter for immunization: Secondary | ICD-10-CM | POA: Diagnosis not present

## 2018-06-27 DIAGNOSIS — Z09 Encounter for follow-up examination after completed treatment for conditions other than malignant neoplasm: Secondary | ICD-10-CM | POA: Diagnosis not present

## 2018-06-27 DIAGNOSIS — E119 Type 2 diabetes mellitus without complications: Secondary | ICD-10-CM | POA: Diagnosis not present

## 2018-06-27 DIAGNOSIS — F17290 Nicotine dependence, other tobacco product, uncomplicated: Secondary | ICD-10-CM | POA: Diagnosis not present

## 2018-06-27 DIAGNOSIS — Z940 Kidney transplant status: Secondary | ICD-10-CM | POA: Diagnosis not present

## 2018-06-27 DIAGNOSIS — Z79899 Other long term (current) drug therapy: Secondary | ICD-10-CM | POA: Diagnosis not present

## 2018-07-01 ENCOUNTER — Other Ambulatory Visit (HOSPITAL_COMMUNITY): Payer: Self-pay

## 2018-07-01 DIAGNOSIS — R82998 Other abnormal findings in urine: Secondary | ICD-10-CM | POA: Diagnosis not present

## 2018-07-01 DIAGNOSIS — I10 Essential (primary) hypertension: Secondary | ICD-10-CM | POA: Diagnosis not present

## 2018-07-01 DIAGNOSIS — Z125 Encounter for screening for malignant neoplasm of prostate: Secondary | ICD-10-CM | POA: Diagnosis not present

## 2018-07-01 DIAGNOSIS — E7800 Pure hypercholesterolemia, unspecified: Secondary | ICD-10-CM | POA: Diagnosis not present

## 2018-07-02 ENCOUNTER — Ambulatory Visit (HOSPITAL_COMMUNITY)
Admission: RE | Admit: 2018-07-02 | Discharge: 2018-07-02 | Disposition: A | Discharge status: Home / Self Care | Payer: Medicare Other | Source: Ambulatory Visit | Attending: Internal Medicine | Admitting: Internal Medicine

## 2018-07-02 DIAGNOSIS — Z940 Kidney transplant status: Secondary | ICD-10-CM | POA: Diagnosis not present

## 2018-07-02 LAB — URINALYSIS, COMPLETE (UACMP) WITH MICROSCOPIC
BACTERIA UA: NONE SEEN
Bilirubin Urine: NEGATIVE
Glucose, UA: 50 mg/dL — AB
KETONES UR: NEGATIVE mg/dL
Leukocytes, UA: NEGATIVE
NITRITE: NEGATIVE
PROTEIN: 100 mg/dL — AB
Specific Gravity, Urine: 1.016 (ref 1.005–1.030)
pH: 6 (ref 5.0–8.0)

## 2018-07-02 LAB — BASIC METABOLIC PANEL
ANION GAP: 11 (ref 5–15)
BUN: 23 mg/dL — ABNORMAL HIGH (ref 6–20)
CALCIUM: 9.1 mg/dL (ref 8.9–10.3)
CO2: 21 mmol/L — ABNORMAL LOW (ref 22–32)
Chloride: 106 mmol/L (ref 98–111)
Creatinine, Ser: 1.79 mg/dL — ABNORMAL HIGH (ref 0.61–1.24)
GFR, EST AFRICAN AMERICAN: 46 mL/min — AB (ref >60)
GFR, EST NON AFRICAN AMERICAN: 40 mL/min — AB (ref >60)
Glucose, Bld: 254 mg/dL — ABNORMAL HIGH (ref 70–99)
POTASSIUM: 4.1 mmol/L (ref 3.5–5.1)
Sodium: 138 mmol/L (ref 135–145)

## 2018-07-02 LAB — CBC
HEMATOCRIT: 44.2 % (ref 39.0–52.0)
HEMOGLOBIN: 14.1 g/dL (ref 13.0–17.0)
MCH: 28.5 pg (ref 26.0–34.0)
MCHC: 31.9 g/dL (ref 30.0–36.0)
MCV: 89.5 fL (ref 78.0–100.0)
Platelets: 207 10*3/uL (ref 150–400)
RBC: 4.94 MIL/uL (ref 4.22–5.81)
RDW: 14 % (ref 11.5–15.5)
WBC: 5.9 10*3/uL (ref 4.0–10.5)

## 2018-07-02 MED ORDER — SODIUM CHLORIDE 0.9 % IV SOLN
5.0000 mg/kg | INTRAVENOUS | Status: DC
  Administered 2018-07-02: 687.5 mg via INTRAVENOUS
  Filled 2018-07-02: qty 687.5

## 2018-07-03 DIAGNOSIS — Z5181 Encounter for therapeutic drug level monitoring: Secondary | ICD-10-CM | POA: Diagnosis not present

## 2018-07-03 DIAGNOSIS — Z940 Kidney transplant status: Secondary | ICD-10-CM | POA: Diagnosis not present

## 2018-07-03 DIAGNOSIS — Z7901 Long term (current) use of anticoagulants: Secondary | ICD-10-CM | POA: Diagnosis not present

## 2018-07-03 DIAGNOSIS — R809 Proteinuria, unspecified: Secondary | ICD-10-CM | POA: Diagnosis not present

## 2018-07-03 LAB — CMV DNA, QUANTITATIVE, PCR
CMV DNA Quant: NEGATIVE IU/mL
Log10 CMV Qn DNA Pl: UNDETERMINED log10 IU/mL

## 2018-07-03 LAB — BK QUANT PCR (PLASMA/SERUM)
BK Quantitaion PCR: NEGATIVE copies/mL
Log10 BK Qn PCR: UNDETERMINED log10copy/mL

## 2018-07-08 DIAGNOSIS — E1129 Type 2 diabetes mellitus with other diabetic kidney complication: Secondary | ICD-10-CM | POA: Diagnosis not present

## 2018-07-08 DIAGNOSIS — I120 Hypertensive chronic kidney disease with stage 5 chronic kidney disease or end stage renal disease: Secondary | ICD-10-CM | POA: Diagnosis not present

## 2018-07-08 DIAGNOSIS — Z6841 Body Mass Index (BMI) 40.0 and over, adult: Secondary | ICD-10-CM | POA: Diagnosis not present

## 2018-07-08 DIAGNOSIS — Z794 Long term (current) use of insulin: Secondary | ICD-10-CM | POA: Diagnosis not present

## 2018-07-08 DIAGNOSIS — Z1389 Encounter for screening for other disorder: Secondary | ICD-10-CM | POA: Diagnosis not present

## 2018-07-08 DIAGNOSIS — I7789 Other specified disorders of arteries and arterioles: Secondary | ICD-10-CM | POA: Diagnosis not present

## 2018-07-08 DIAGNOSIS — I10 Essential (primary) hypertension: Secondary | ICD-10-CM | POA: Diagnosis not present

## 2018-07-08 DIAGNOSIS — E7800 Pure hypercholesterolemia, unspecified: Secondary | ICD-10-CM | POA: Diagnosis not present

## 2018-07-08 DIAGNOSIS — D631 Anemia in chronic kidney disease: Secondary | ICD-10-CM | POA: Diagnosis not present

## 2018-07-08 DIAGNOSIS — Z940 Kidney transplant status: Secondary | ICD-10-CM | POA: Diagnosis not present

## 2018-07-08 DIAGNOSIS — Z0000 Encounter for general adult medical examination without abnormal findings: Secondary | ICD-10-CM | POA: Diagnosis not present

## 2018-07-08 DIAGNOSIS — N2581 Secondary hyperparathyroidism of renal origin: Secondary | ICD-10-CM | POA: Diagnosis not present

## 2018-07-08 LAB — MICROALBUMIN, URINE: Microalb, Ur: 3.9

## 2018-07-09 ENCOUNTER — Ambulatory Visit: Payer: Medicare Other | Admitting: Endocrinology

## 2018-07-10 ENCOUNTER — Other Ambulatory Visit (INDEPENDENT_AMBULATORY_CARE_PROVIDER_SITE_OTHER): Payer: Medicare Other

## 2018-07-10 DIAGNOSIS — E291 Testicular hypofunction: Secondary | ICD-10-CM

## 2018-07-10 DIAGNOSIS — E039 Hypothyroidism, unspecified: Secondary | ICD-10-CM | POA: Diagnosis not present

## 2018-07-10 DIAGNOSIS — E1165 Type 2 diabetes mellitus with hyperglycemia: Secondary | ICD-10-CM

## 2018-07-10 DIAGNOSIS — Z794 Long term (current) use of insulin: Secondary | ICD-10-CM

## 2018-07-10 LAB — LIPID PANEL
CHOL/HDL RATIO: 3
Cholesterol: 166 mg/dL (ref 0–200)
HDL: 54.2 mg/dL (ref >39.00)
LDL Cholesterol: 95 mg/dL (ref 0–99)
NonHDL: 111.7
TRIGLYCERIDES: 86 mg/dL (ref 0.0–149.0)
VLDL: 17.2 mg/dL (ref 0.0–40.0)

## 2018-07-10 LAB — ALT: ALT: 19 U/L (ref 0–53)

## 2018-07-10 LAB — TSH: TSH: 1.49 u[IU]/mL (ref 0.35–4.50)

## 2018-07-10 LAB — HEMOGLOBIN A1C: HEMOGLOBIN A1C: 7.8 % — AB (ref 4.6–6.5)

## 2018-07-13 LAB — TESTOSTERONE, FREE, TOTAL, SHBG
Sex Hormone Binding: 50.9 nmol/L (ref 19.3–76.4)
Testosterone, Free: 4.5 pg/mL — ABNORMAL LOW (ref 7.2–24.0)
Testosterone: 422 ng/dL (ref 264–916)

## 2018-07-16 ENCOUNTER — Encounter: Payer: Self-pay | Admitting: Endocrinology

## 2018-07-16 ENCOUNTER — Ambulatory Visit (INDEPENDENT_AMBULATORY_CARE_PROVIDER_SITE_OTHER): Payer: Medicare Other | Admitting: Endocrinology

## 2018-07-16 VITALS — BP 130/92 | HR 64 | Ht 71.0 in | Wt 300.0 lb

## 2018-07-16 DIAGNOSIS — Z794 Long term (current) use of insulin: Secondary | ICD-10-CM | POA: Diagnosis not present

## 2018-07-16 DIAGNOSIS — E291 Testicular hypofunction: Secondary | ICD-10-CM

## 2018-07-16 DIAGNOSIS — E1165 Type 2 diabetes mellitus with hyperglycemia: Secondary | ICD-10-CM

## 2018-07-16 DIAGNOSIS — E039 Hypothyroidism, unspecified: Secondary | ICD-10-CM | POA: Diagnosis not present

## 2018-07-16 MED ORDER — TESTOSTERONE 20.25 MG/ACT (1.62%) TD GEL: TRANSDERMAL | 2 refills | Status: DC

## 2018-07-16 NOTE — Patient Instructions (Addendum)
Tresiba 24 units   Humulin R 1 Unit per 20 over 100 for highs  [place] [phone]

## 2018-07-16 NOTE — Progress Notes (Signed)
Patient ID: Robert [name], male   DOB: 02-May-1961, 57 y.o.   MRN: [id number]   Reason for Appointment: Endocrinology follow-up  History of Present Illness   DIABETES diagnosis date:  2012  Previous history: His blood sugar has been difficult to control since onset. Initially was taking oral hypoglycemic drugs like glyburide but could not take metformin because of renal dysfunction. Since Victoza did not help his control he was started on Lantus and then U 500 insulin also His A1c has previously ranged from 7.8-9.8, the lowest level in 03/2013 He was on Victoza but because of his markedly decreased appetite and weight loss this was stopped in 9/15  Recent history:   Insulin regimen: Tresiba 18-20 units daily., Humulin U-500 before meals 1: 5 carbohydrate ratio, usually 20 in the morning, 10 at lunch and 10/20 at dinner   His A1c is trending higher and was 7.5 compared to 7% before Last fructosamine was 287 and is now 284  Current blood sugar patterns, management and problems identified:  He was told to be more consistent with taking his pre-meal insulin before eating  Also he was be advised to check his sugars more before the meal to add a correction factor to his mealtime coverage but he has not done so  He says that in the mornings he will take a correction dose of 1: 10 but because of not being sure about eating breakfast he will not take any mealtime coverage  Also because of lack of testing around lunchtime frequently will not take enough insulin to cover his lunch meal also  With this his blood sugars are frequently over 200 nonfasting blood sugars are higher than expected for his A1c again  He has not had good FASTING readings and has not adjusted his [name], not aware that he can increase this to get his morning sugar more consistently normal  Currently not doing any exercise because of fatigue and lack of time  Proper timing of medications in relation to  meals: Yes.          Monitors blood glucose:  recently 2.1 times a day .    Glucometer: One Probation officer.          Blood Glucose readings by download   PRE-MEAL Fasting Lunch Dinner Bedtime Overall  Glucose range:  114-306      Mean/median:  178  263  218   196   POST-MEAL PC Breakfast PC Lunch PC Dinner  Glucose range:    213, 270  Mean/median:      Previous readings:  Mean values apply above for all meters except median for One Touch  PRE-MEAL Fasting Lunch Dinner Bedtime Overall  Glucose range:  74-257      Mean/median:  178  201    202+/-78   POST-MEAL PC Breakfast PC Lunch PC Dinner  Glucose range:   202-407  135-236  Mean/median:   290       Meals: 2-3 meals per day. 7-8 am and Noon and Dinner 5-7 PM .        Physical activity: exercise: not much       Weight control:  Wt Readings from Last 3 Encounters:  07/16/18 300 lb (136.1 kg)  07/02/18 (!) 301 lb 5 oz (136.7 kg)  06/04/18 (!) 303 lb 14.4 oz (154.0 kg)        Complications:   nephropathy, neuropathy   Diabetes labs:   Lab Results  Component Value Date  HGBA1C 7.8 (H) 07/10/2018   HGBA1C 7.5 (H) 03/06/2018   HGBA1C 7.0 (H) 09/19/2017   Lab Results  Component Value Date   MICROALBUR 224.5 Repeated and verified X2. (H) 01/04/2014   LDLCALC 95 07/10/2018   CREATININE 1.79 (H) 07/02/2018    Lab Results  Component Value Date   FRUCTOSAMINE 284 05/08/2018   FRUCTOSAMINE 287 (H) 12/04/2017   FRUCTOSAMINE 279 (H) 09/19/2017     Lab on 07/10/2018  Component Date Value Ref Range Status  . Hgb A1c MFr Bld 07/10/2018 7.8* 4.6 - 6.5 % Final   Glycemic Control Guidelines for People with Diabetes:Non Diabetic:  <6%Goal of Therapy: <7%Additional Action Suggested:  >8%   . TSH 07/10/2018 1.49  0.35 - 4.50 uIU/mL Final  . Testosterone 07/10/2018 422  264 - 916 ng/dL Final   Comment: Adult male reference interval is based on a population of healthy nonobese males (BMI <30) between 17 and 68 years  old. [place], [place] [phone]. PMID: 24580998.   Marland Kitchen Testosterone, Free 07/10/2018 4.5* 7.2 - 24.0 pg/mL Final  . Sex Hormone Binding 07/10/2018 50.9  19.3 - 76.4 nmol/L Final  . ALT 07/10/2018 19  0 - 53 U/L Final  . Cholesterol 07/10/2018 166  0 - 200 mg/dL Final   ATP III Classification       Desirable:  < 200 mg/dL               Borderline High:  200 - 239 mg/dL          High:  > = 240 mg/dL  . Triglycerides 07/10/2018 86.0  0.0 - 149.0 mg/dL Final   Normal:  <150 mg/dLBorderline High:  150 - 199 mg/dL  . HDL 07/10/2018 54.20  >39.00 mg/dL Final  . VLDL 07/10/2018 17.2  0.0 - 40.0 mg/dL Final  . LDL Cholesterol 07/10/2018 95  0 - 99 mg/dL Final  . Total CHOL/HDL Ratio 07/10/2018 3   Final                  Men          Women1/2 Average Risk     3.4          3.3Average Risk          5.0          4.42X Average Risk          9.6          7.13X Average Risk          15.0          11.0                      . NonHDL 07/10/2018 111.70   Final   NOTE:  Non-HDL goal should be 30 mg/dL higher than patient's LDL goal (i.e. LDL goal of < 70 mg/dL, would have non-HDL goal of < 100 mg/dL)    OTHER problems: See review of systems    Allergies as of 07/16/2018   No Known Allergies     Medication List        Accurate as of 07/16/18  9:10 AM. Always use your most recent med list.          acetaminophen 500 MG tablet Commonly known as:  TYLENOL Take 1,000 mg by mouth every 12 (twelve) hours as needed for mild pain.   amLODipine 5 MG tablet Commonly known as:  NORVASC Take 10 mg by mouth daily.  BYSTOLIC 10 MG tablet Generic drug:  nebivolol   DULoxetine 30 MG capsule Commonly known as:  CYMBALTA Take 30 mg by mouth daily.   FIFTY50 GLUCOSE METER 2.0 w/Device Kit Use as directed   fluocinonide cream 0.05 % Commonly known as:  LIDEX Apply 1 application topically daily as needed (Skin inflammation (hand, thigh, back)).   FOSRENOL 1000 MG chewable tablet Generic  drug:  lanthanum   furosemide 20 MG tablet Commonly known as:  LASIX Take 40 mg by mouth daily.   gabapentin 100 MG capsule Commonly known as:  NEURONTIN Take 200-300 mg by mouth See admin instructions. 200 mg in the morning and 300 mg at bedtime   gentamicin cream 0.1 % Commonly known as:  GARAMYCIN daily as needed.   glucose blood test strip Use to check blood sugars up to 6 times daily   HUMULIN R 500 UNIT/ML injection Generic drug:  insulin regular human CONCENTRATED Using a U-100 1cc/31G/5/16" INSULIN SYRINGE, draw up and inject 5-18 units into the skin three times a day 30 minutes prior to each meal   Insulin Pen Needle 31G X 5 MM Misc USE 4 PER DAY TO INJECT INSULIN.   Insulin Syringe-Needle U-100 31G X 5/16" 0.3 ML Misc Use to inject insulin 2-3 times per day   lactulose 10 GM/15ML solution Commonly known as:  CHRONULAC   levothyroxine 100 MCG tablet Commonly known as:  SYNTHROID, LEVOTHROID TAKE 1 TABLET BY MOUTH EVERY DAY   lisinopril 5 MG tablet Commonly known as:  PRINIVIL,ZESTRIL Take 10 mg by mouth daily.   metoprolol tartrate 50 MG tablet Commonly known as:  LOPRESSOR TAKE 1 TABLET BY MOUTH TWICE A DAY   mycophenolate 250 MG capsule Commonly known as:  CELLCEPT Take 1,000 mg by mouth every 12 (twelve) hours.   NULOJIX 250 MG Solr injection Generic drug:  belatacept Inject 24.5 mLs into the vein every 28 (twenty-eight) days.   ONETOUCH DELICA LANCETS FINE Misc Use to check blood sugars 4-6 times daily   predniSONE 5 MG tablet Commonly known as:  DELTASONE Take 5 mg by mouth daily with breakfast.   rosuvastatin 10 MG tablet Commonly known as:  CRESTOR TAKE 1 TABLET BY MOUTH EVERY DAY   senna-docusate 8.6-50 MG tablet Commonly known as:  Senokot-S Take 1 tablet by mouth as needed for mild constipation.   tamsulosin 0.4 MG Caps capsule Commonly known as:  FLOMAX Take 0.8 mg by mouth at bedtime.   Testosterone 20.25 MG/ACT (1.62%)  Gel Apply 3 pumps every morning on shoulder area   traMADol 50 MG tablet Commonly known as:  ULTRAM Take 1 tablet (50 mg total) by mouth every 6 (six) hours as needed.   traZODone 50 MG tablet Commonly known as:  DESYREL Take 50-100 mg by mouth at bedtime as needed for sleep.   TRESIBA FLEXTOUCH 100 UNIT/ML Sopn FlexTouch Pen Generic drug:  insulin degludec INJECT 30 UNITS INTO THE SKIN EVERY MORNING   UNABLE TO FIND BiPAP: At bedtime   UNABLE TO FIND BIPAP   UNABLE TO FIND SENNA PLUS 8.6-50 mg tablet   VICTOZA 18 MG/3ML Sopn Generic drug:  liraglutide Inject 0.2 mLs (1.2 mg total) into the skin daily. Inject once daily at the same time   zolpidem 10 MG tablet Commonly known as:  AMBIEN Take 10-20 mg by mouth at bedtime as needed for sleep.       Allergies: No Known Allergies  Past Medical History:  Diagnosis Date  . Anemia  low hgb at present  . Arthritis    FEET  . Blood transfusion without reported diagnosis 1974   kidney removed - L  . Cancer ([place])    skin ca right shoulder, plastic dsyplasia, pre-Ca polpys removed on Colonoscopy- 07/2014  . Colon polyps 07/23/2014   Tubular adenomas x 5  . Constipation   . Diabetes mellitus without complication (HCC)    Type 2  . Difficult intubation    unsure of actual problem but it was during the January 28, 2015 procedure.  Marland Kitchen Dysrhythmia   . Eczema   . ESRD (end stage renal disease) ([place])    hEMO mwf  . Headache(784.0)    slight dull h/a due kidney failure  . Hyperlipidemia   . Hypertension    SVT h/o , followed by Dr. Claiborne Billings  . Hypothyroidism   . Macular degeneration   . Neuropathy    right hand and both feet  . PAT (paroxysmal atrial tachycardia) ([place])   . PSVT (paroxysmal supraventricular tachycardia) ([place])   . SBO (small bowel obstruction) ([place])   . Shortness of breath    with exertion  . Sleep apnea 06/18/11   split-night sleep study- [facility]., BiPAP- 13-16  . Thyroid  disease   . Umbilical hernia    will repair with thi surgery    Past Surgical History:  Procedure Laterality Date  . AV FISTULA PLACEMENT Right 02/25/2015   Procedure: RIGHT ARTERIOVENOUS (AV) FISTULA CREATION;  Surgeon: Serafina Mitchell, MD;  Location: [place];  Service: Vascular;  Laterality: Right;  . AV FISTULA PLACEMENT Right 09/15/2015   Procedure: ARTERIOVENOUS (AV) FISTULA CREATION- RIGHT ARM;  Surgeon: Serafina Mitchell, MD;  Location: [place];  Service: Vascular;  Laterality: Right;  . CAPD INSERTION N/A 05/18/2014   Procedure: LAPAROSCOPIC INSERTION CONTINUOUS AMBULATORY PERITONEAL DIALYSIS  (CAPD) CATHETER;  Surgeon: Ralene Ok, MD;  Location: [place];  Service: General;  Laterality: N/A;  . COLONOSCOPY    . COLONOSCOPY WITH PROPOFOL N/A 07/12/2016   Procedure: COLONOSCOPY WITH PROPOFOL;  Surgeon: [name], MD;  Location: [facility];  Service: Endoscopy;  Laterality: N/A;  . declotting of fistula  08/2015  . ESOPHAGOGASTRODUODENOSCOPY (EGD) WITH PROPOFOL N/A 07/12/2016   Procedure: ESOPHAGOGASTRODUODENOSCOPY (EGD) WITH PROPOFOL;  Surgeon: [name], MD;  [facility];  Service: Endoscopy;  Laterality: N/A;  . FISTULA SUPERFICIALIZATION Right 02/07/2016   Procedure: RIIGHT UPPER ARM FISTULA SUPERFICIALIZATION;  Surgeon: Elam Dutch, MD;  Location: [place];  Service: Vascular;  Laterality: Right;  . IJ catheter insertion    . INSERTION OF DIALYSIS CATHETER Right 02/25/2015   Procedure: INSERTION OF RIGHT INTERNAL JUGULAR DIALYSIS CATHETER;  Surgeon: Serafina Mitchell, MD;  Location: [place];  Service: Vascular;  Laterality: Right;  . KIDNEY TRANSPLANT    . LAPAROSCOPIC REPOSITIONING CAPD CATHETER N/A 06/16/2014   Procedure: LAPAROSCOPIC REPOSITIONING CAPD CATHETER;  Surgeon: Ralene Ok, MD;  Location: [place];  Service: General;  Laterality: N/A;  . LAPAROSCOPY N/A 05/04/2015   Procedure: LAPAROSCOPY DIAGNOSTIC LYSIS OF ADHESIONS;  Surgeon: Coralie Keens, MD;   Location: [place];  Service: General;  Laterality: N/A;  . MINOR REMOVAL OF PERITONEAL DIALYSIS CATHETER N/A 04/28/2015   Procedure:  REMOVAL OF PERITONEAL DIALYSIS CATHETER;  Surgeon: Coralie Keens, MD;  Location: [place];  Service: General;  Laterality: N/A;  . NEPHRECTOMY Left 1974  . RENAL BIOPSY Right 2012  . REVISON OF ARTERIOVENOUS FISTULA Right 05/26/2015   Procedure:  SUPERFICIALIZATION OF ARTERIOVENOUS FISTULA WITH SIDE BRANCH LIGATIONS;  Surgeon: Serafina Mitchell, MD;  Location: [place];  Service: Vascular;  Laterality: Right;  . SKIN CANCER EXCISION     right shoulder  . SVT ABLATION N/A 11/23/2016   Procedure: SVT Ablation;  Surgeon: Will Meredith Leeds, MD;  Location: [place] [facility];  Service: Cardiovascular;  Laterality: N/A;  . UMBILICAL HERNIA REPAIR N/A 05/18/2014   Procedure: HERNIA REPAIR UMBILICAL ADULT;  Surgeon: Ralene Ok, MD;  Location: [place];  Service: General;  Laterality: N/A;  . UMBILICAL HERNIA REPAIR N/A 04/28/2015   Procedure: UMBILICAL HERNIA REPAIR WITH MESH;  Surgeon: Coralie Keens, MD;  [facility];  Service: General;  Laterality: N/A;    Family History  Problem Relation Age of Onset  . Colon polyps Father   . Colon cancer Neg Hx   . Stomach cancer Neg Hx     Social History:  reports that he quit smoking about 5 years ago. His smoking use included cigars. He has never used smokeless tobacco. He reports that he drinks alcohol. He reports that he does not use drugs.  Review of Systems:   Hypertension:    His blood pressure has been managed by his nephrologist and transplant team Also monitors at home  BP Readings from Last 3 Encounters:  07/16/18 (!) 130/92  07/02/18 130/71  06/04/18 (!) 148/75     Lipids:  His LDL is controlled with 10 mg Crestor Has no evidence of vascular disease    Lab Results  Component Value Date   CHOL 166 07/10/2018   CHOL 137 05/28/2017   CHOL 130 11/13/2016   Lab Results  Component Value Date   HDL  54.20 07/10/2018   HDL 49.60 05/28/2017   HDL 41.40 11/13/2016   Lab Results  Component Value Date   LDLCALC 95 07/10/2018   LDLCALC 73 05/28/2017   LDLCALC 68 11/13/2016   Lab Results  Component Value Date   TRIG 86.0 07/10/2018   TRIG 71.0 05/28/2017   TRIG 102.0 11/13/2016   Lab Results  Component Value Date   CHOLHDL 3 07/10/2018   CHOLHDL 3 05/28/2017   CHOLHDL 3 11/13/2016   No results found for: LDLDIRECT   HYPOTHYROIDISM: He has had long-standing hypothyroidism  Has been taking his 100 g levothyroxine before breakfast daily  He still complains of feeling tired and give out easily.  He also may tend to sleep more on the weekends  TSH normal  Lab Results  Component Value Date   TSH 1.49 07/10/2018       HYPOGONADISM: 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 metabolic syndrome and previously had subjectively improved with supplementation using Fortesta  He was having fatigue and decreased motivation and because of his free testosterone level being low at 3.7 he was prescribed AndroGel in February 2019 His testosterone level tends to be variable Now taking 3 pumps on one arm and 2 on the other with improvement in the total testosterone level  However even with the higher dose of AndroGel his fatigue is no better   Lab Results  Component Value Date   TESTOSTERONE 422 07/10/2018   TESTOSTERONE 326.83 05/08/2018   TESTOSTERONE 170.91 (L) 03/06/2018     Diabetic neuropathy: Has mild sensory loss on his last exam      Examination:   BP (!) 130/92   Pulse 64   Ht '5\' 11"'$  (1.803 m)   Wt 300 lb (136.1 kg)  SpO2 98%   BMI 41.84 kg/m   Body mass index is 41.84 kg/m.    ASSESSMENT/ PLAN:    Diabetes type 2 with obesity, insulin-requiring:   See history of present illness for detailed discussion of his current management, problems identified and sugar patterns on his meter download  Currently on a regimen of Tresiba and  mealtime U-500 insulin with syringe  His A1c is gradually going, now 7.8  His main problem is not getting enough insulin both basal and bolus His regimen is also more difficult because of his variable eating habits, eating out frequently at lunch and timing of insulin Also did not understand the need to combine the correction factor with the carbohydrate coverage at the same time This is partly from inadequate coverage but also not getting enough [name] for his basal requirement at present  Although he was agreeable to trying the insulin pump the approval for insurance coverage and other formalities have not been done yet or there is no response  Recommendations:  Low-fat diet as much as possible  Correction factor I: 20 so that he can take adequate amount to cover his meals without being afraid of low sugars  Given the titration sheet for Tresiba to get his morning sugars down: He will start with 24 units and increase the dose every 3 days x 2 units until his glucose is in the 90-130 range  Prescription for freestyle libre sensor has been sent and this will enable him to keep up with his blood sugars much more effectively than currently when he is only checking about once a day on an average  Follow-up in 2 months  HYPOGONADISM: He appears to be needing relatively large doses of testosterone Not clear if he is symptomatic from this because he has persistent fatigue of unclear etiology Again although his testosterone level has improved his free testosterone seems to be relatively low He will continue his dose of 5 pumps AndroGel daily rather than increase it further Will need to continue monitoring hemoglobin with this  Hypothyroidism: Adequately controlled with normal TSH  There are no Patient Instructions on file for this visit.  Counseling time on subjects discussed in the diabetes evaluation and management sections is over 50% of today's 25 minute visit    Elayne Snare 07/16/2018, 9:10 AM

## 2018-07-18 ENCOUNTER — Encounter: Payer: Self-pay | Admitting: *Deleted

## 2018-07-22 ENCOUNTER — Telehealth: Payer: Self-pay | Admitting: Endocrinology

## 2018-07-22 ENCOUNTER — Other Ambulatory Visit: Payer: Self-pay

## 2018-07-22 DIAGNOSIS — Z940 Kidney transplant status: Secondary | ICD-10-CM | POA: Diagnosis not present

## 2018-07-22 DIAGNOSIS — Z1212 Encounter for screening for malignant neoplasm of rectum: Secondary | ICD-10-CM | POA: Diagnosis not present

## 2018-07-22 MED ORDER — TESTOSTERONE 20.25 MG/ACT (1.62%) TD GEL: TRANSDERMAL | 2 refills | Status: DC

## 2018-07-22 NOTE — Telephone Encounter (Signed)
New prescription has been faxed and patient informed

## 2018-07-22 NOTE — Telephone Encounter (Signed)
Patient states "Please revise Testosterone (ANDROGEL PUMP) 20.25 MG/ACT (1.62%) GEL to 90 days as requested." [id number] - [place], [place] - [address]

## 2018-07-29 ENCOUNTER — Other Ambulatory Visit (HOSPITAL_COMMUNITY): Payer: Self-pay | Admitting: *Deleted

## 2018-07-30 ENCOUNTER — Ambulatory Visit (HOSPITAL_COMMUNITY)
Admission: RE | Admit: 2018-07-30 | Discharge: 2018-07-30 | Disposition: A | Discharge status: Home / Self Care | Payer: Medicare Other | Source: Ambulatory Visit | Attending: Internal Medicine | Admitting: Internal Medicine

## 2018-07-30 DIAGNOSIS — Z940 Kidney transplant status: Secondary | ICD-10-CM | POA: Insufficient documentation

## 2018-07-30 DIAGNOSIS — Z5181 Encounter for therapeutic drug level monitoring: Secondary | ICD-10-CM | POA: Insufficient documentation

## 2018-07-30 DIAGNOSIS — Z79899 Other long term (current) drug therapy: Secondary | ICD-10-CM | POA: Insufficient documentation

## 2018-07-30 LAB — CBC
HCT: 44.6 % (ref 39.0–52.0)
Hemoglobin: 13.7 g/dL (ref 13.0–17.0)
MCH: 27.1 pg (ref 26.0–34.0)
MCHC: 30.7 g/dL (ref 30.0–36.0)
MCV: 88.3 fL (ref 80.0–100.0)
NRBC: 0 % (ref 0.0–0.2)
PLATELETS: 198 10*3/uL (ref 150–400)
RBC: 5.05 MIL/uL (ref 4.22–5.81)
RDW: 13.9 % (ref 11.5–15.5)
WBC: 6.7 10*3/uL (ref 4.0–10.5)

## 2018-07-30 LAB — URINALYSIS, COMPLETE (UACMP) WITH MICROSCOPIC
Bacteria, UA: NONE SEEN
Bilirubin Urine: NEGATIVE
GLUCOSE, UA: 150 mg/dL — AB
Ketones, ur: NEGATIVE mg/dL
Leukocytes, UA: NEGATIVE
Nitrite: NEGATIVE
PH: 5 (ref 5.0–8.0)
Protein, ur: 100 mg/dL — AB
SPECIFIC GRAVITY, URINE: 1.019 (ref 1.005–1.030)

## 2018-07-30 LAB — BASIC METABOLIC PANEL
ANION GAP: 13 (ref 5–15)
BUN: 20 mg/dL (ref 6–20)
CALCIUM: 9.5 mg/dL (ref 8.9–10.3)
CO2: 20 mmol/L — AB (ref 22–32)
Chloride: 105 mmol/L (ref 98–111)
Creatinine, Ser: 1.58 mg/dL — ABNORMAL HIGH (ref 0.61–1.24)
GFR calc non Af Amer: 46 mL/min — ABNORMAL LOW (ref >60)
GFR, EST AFRICAN AMERICAN: 54 mL/min — AB (ref >60)
Glucose, Bld: 248 mg/dL — ABNORMAL HIGH (ref 70–99)
Potassium: 4.8 mmol/L (ref 3.5–5.1)
Sodium: 138 mmol/L (ref 135–145)

## 2018-07-30 LAB — PROTEIN / CREATININE RATIO, URINE
Creatinine, Urine: 105.02 mg/dL
PROTEIN CREATININE RATIO: 2.35 mg/mg{creat} — AB (ref 0.00–0.15)
Total Protein, Urine: 247 mg/dL

## 2018-07-30 MED ORDER — SODIUM CHLORIDE 0.9 % IV SOLN
5.0000 mg/kg | INTRAVENOUS | Status: DC
  Administered 2018-07-30: 687.5 mg via INTRAVENOUS
  Filled 2018-07-30: qty 687.5

## 2018-07-31 LAB — CMV DNA, QUANTITATIVE, PCR
CMV DNA QUANT: NEGATIVE [IU]/mL
LOG10 CMV QN DNA PL: UNDETERMINED {Log_IU}/mL

## 2018-07-31 LAB — BK QUANT PCR (PLASMA/SERUM)
BK Quantitaion PCR: NEGATIVE copies/mL
Log10 BK Qn PCR: UNDETERMINED log10copy/mL

## 2018-08-08 DIAGNOSIS — Z7901 Long term (current) use of anticoagulants: Secondary | ICD-10-CM | POA: Diagnosis not present

## 2018-08-08 DIAGNOSIS — Z940 Kidney transplant status: Secondary | ICD-10-CM | POA: Diagnosis not present

## 2018-08-18 DIAGNOSIS — Z940 Kidney transplant status: Secondary | ICD-10-CM | POA: Diagnosis not present

## 2018-08-18 DIAGNOSIS — N2589 Other disorders resulting from impaired renal tubular function: Secondary | ICD-10-CM | POA: Diagnosis not present

## 2018-08-18 DIAGNOSIS — N433 Hydrocele, unspecified: Secondary | ICD-10-CM | POA: Diagnosis not present

## 2018-08-18 DIAGNOSIS — Z4822 Encounter for aftercare following kidney transplant: Secondary | ICD-10-CM | POA: Diagnosis not present

## 2018-08-18 DIAGNOSIS — Z79899 Other long term (current) drug therapy: Secondary | ICD-10-CM | POA: Diagnosis not present

## 2018-08-18 DIAGNOSIS — R809 Proteinuria, unspecified: Secondary | ICD-10-CM | POA: Diagnosis not present

## 2018-08-18 DIAGNOSIS — N5089 Other specified disorders of the male genital organs: Secondary | ICD-10-CM | POA: Diagnosis not present

## 2018-08-20 ENCOUNTER — Ambulatory Visit (HOSPITAL_COMMUNITY): Payer: Medicare Other | Attending: Cardiology

## 2018-08-20 ENCOUNTER — Telehealth: Payer: Self-pay | Admitting: Endocrinology

## 2018-08-20 ENCOUNTER — Other Ambulatory Visit: Payer: Self-pay

## 2018-08-20 DIAGNOSIS — I350 Nonrheumatic aortic (valve) stenosis: Secondary | ICD-10-CM | POA: Insufficient documentation

## 2018-08-20 NOTE — Telephone Encounter (Signed)
Patient stated that he would like to know if our office has heard anything about his freestyle libra.  Please advise

## 2018-08-20 NOTE — Telephone Encounter (Signed)
Not aware of any assistance program, he needs to talk to [facility]

## 2018-08-20 NOTE — Telephone Encounter (Signed)
LMTCB

## 2018-08-20 NOTE — Telephone Encounter (Signed)
Spoke with patient about note below and he agreed to the pump start appointment but has concerns about cost and wants to know if there is any patient assistance for either the freestyle libre or omnipods please advise if you know of any

## 2018-08-20 NOTE — Telephone Encounter (Signed)
Patient called stating they are back to taking Victosa.

## 2018-08-20 NOTE — Telephone Encounter (Signed)
He has please confirm whether he is taking 0.6 or 1.2 mg.

## 2018-08-20 NOTE — Telephone Encounter (Signed)
Spoke with the patient and he stated Dr. Dwyane Dee was going to work on this for him and I do not see orders for freestyle libre and omnipod please advise

## 2018-08-20 NOTE — Telephone Encounter (Signed)
Please have Robert [name] schedule his pump start.  He needs to call the DME suppliers for getting the freestyle [place]

## 2018-08-20 NOTE — Telephone Encounter (Signed)
Just an FYI please advise if I need to call the patient to get dosage

## 2018-08-20 NOTE — Telephone Encounter (Signed)
Patient is taking 0.6 mg

## 2018-08-21 NOTE — Telephone Encounter (Signed)
Gave information from note below and phone number to reach [employer].

## 2018-08-27 ENCOUNTER — Encounter: Payer: Self-pay | Admitting: Podiatry

## 2018-08-27 ENCOUNTER — Ambulatory Visit (INDEPENDENT_AMBULATORY_CARE_PROVIDER_SITE_OTHER): Payer: Medicare Other | Admitting: Podiatry

## 2018-08-27 DIAGNOSIS — B351 Tinea unguium: Secondary | ICD-10-CM

## 2018-08-27 DIAGNOSIS — M79674 Pain in right toe(s): Secondary | ICD-10-CM | POA: Diagnosis not present

## 2018-08-27 DIAGNOSIS — E1149 Type 2 diabetes mellitus with other diabetic neurological complication: Secondary | ICD-10-CM

## 2018-08-27 DIAGNOSIS — M79675 Pain in left toe(s): Secondary | ICD-10-CM | POA: Diagnosis not present

## 2018-08-27 NOTE — Progress Notes (Signed)
Patient ID: Robert [name], male   DOB: 07-19-1961, 58 y.o.   MRN: [id number] Complaint:  Visit Type: Patient returns to my office for continued preventative foot care services. Complaint: Patient states" my nails have grown long and thick and become painful to walk and wear shoes" Patient has been diagnosed with DM and had a kidney transplant right side.  This is leading to increased leg swelling left.  . The patient presents for preventative foot care services. No changes to ROS  Podiatric Exam: Vascular: dorsalis pedis and posterior tibial pulses are palpable bilateral. Capillary return is immediate. Temperature gradient is WNL. Skin turgor WNL  Sensorium: Diminished  Semmes Weinstein monofilament test. Normal tactile sensation bilaterally. Nail Exam: Pt has thick disfigured discolored nails with subungual debris noted bilateral entire nail hallux through fifth toenails Ulcer Exam: There is no evidence of ulcer or pre-ulcerative changes or infection. Orthopedic Exam: Muscle tone and strength are WNL. No limitations in general ROM. No crepitus or effusions noted. Foot type and digits show no abnormalities. Bony prominences are unremarkable. HAV  B/L Skin: No Porokeratosis. No infection or ulcers  Diagnosis:  Onychomycosis, , Pain in right toe, pain in left toes, Diabetic neuropathy  Hallux limitus 1st MPJ  B/L  Treatment & Plan Procedures and Treatment: Consent by patient was obtained for treatment procedures. The patient understood the discussion of treatment and procedures well. All questions were answered thoroughly reviewed. Debridement of mycotic and hypertrophic toenails, 1 through 5 bilateral and clearing of subungual debris. No ulceration, no infection noted. Signed ABN for 2019.  Return Visit-Office Procedure: Patient instructed to return to the office for a follow up visit 3 months for continued evaluation and treatment.    Gardiner Barefoot DPM

## 2018-08-28 ENCOUNTER — Ambulatory Visit (HOSPITAL_COMMUNITY)
Admission: RE | Admit: 2018-08-28 | Discharge: 2018-08-28 | Disposition: A | Discharge status: Home / Self Care | Payer: Medicare Other | Source: Ambulatory Visit | Attending: Internal Medicine | Admitting: Internal Medicine

## 2018-08-28 ENCOUNTER — Other Ambulatory Visit: Payer: Self-pay | Admitting: Endocrinology

## 2018-08-28 DIAGNOSIS — Z5181 Encounter for therapeutic drug level monitoring: Secondary | ICD-10-CM | POA: Insufficient documentation

## 2018-08-28 DIAGNOSIS — Z940 Kidney transplant status: Secondary | ICD-10-CM | POA: Insufficient documentation

## 2018-08-28 LAB — URINALYSIS, COMPLETE (UACMP) WITH MICROSCOPIC
BILIRUBIN URINE: NEGATIVE
Glucose, UA: 50 mg/dL — AB
KETONES UR: NEGATIVE mg/dL
Leukocytes, UA: NEGATIVE
NITRITE: NEGATIVE
PROTEIN: 100 mg/dL — AB
SPECIFIC GRAVITY, URINE: 1.019 (ref 1.005–1.030)
pH: 6 (ref 5.0–8.0)

## 2018-08-28 LAB — BASIC METABOLIC PANEL
Anion gap: 8 (ref 5–15)
BUN: 17 mg/dL (ref 6–20)
CO2: 22 mmol/L (ref 22–32)
Calcium: 9.4 mg/dL (ref 8.9–10.3)
Chloride: 108 mmol/L (ref 98–111)
Creatinine, Ser: 1.9 mg/dL — ABNORMAL HIGH (ref 0.61–1.24)
GFR calc Af Amer: 43 mL/min — ABNORMAL LOW (ref >60)
GFR calc non Af Amer: 37 mL/min — ABNORMAL LOW (ref >60)
GLUCOSE: 240 mg/dL — AB (ref 70–99)
POTASSIUM: 4.4 mmol/L (ref 3.5–5.1)
SODIUM: 138 mmol/L (ref 135–145)

## 2018-08-28 LAB — CBC
HEMATOCRIT: 46.6 % (ref 39.0–52.0)
HEMOGLOBIN: 14.9 g/dL (ref 13.0–17.0)
MCH: 28 pg (ref 26.0–34.0)
MCHC: 32 g/dL (ref 30.0–36.0)
MCV: 87.6 fL (ref 80.0–100.0)
Platelets: 243 10*3/uL (ref 150–400)
RBC: 5.32 MIL/uL (ref 4.22–5.81)
RDW: 14.1 % (ref 11.5–15.5)
WBC: 6.3 10*3/uL (ref 4.0–10.5)
nRBC: 0 % (ref 0.0–0.2)

## 2018-08-28 MED ORDER — SODIUM CHLORIDE 0.9 % IV SOLN
5.0000 mg/kg | INTRAVENOUS | Status: DC
  Administered 2018-08-28: 687.5 mg via INTRAVENOUS
  Filled 2018-08-28: qty 687.5

## 2018-08-29 ENCOUNTER — Telehealth: Payer: Self-pay | Admitting: Endocrinology

## 2018-08-29 ENCOUNTER — Telehealth: Payer: Self-pay | Admitting: Cardiovascular Disease

## 2018-08-29 LAB — BK QUANT PCR (PLASMA/SERUM)
BK Quantitaion PCR: NEGATIVE copies/mL
Log10 BK Qn PCR: UNDETERMINED log10copy/mL

## 2018-08-29 LAB — CMV DNA, QUANTITATIVE, PCR
CMV DNA QUANT: NEGATIVE [IU]/mL
Log10 CMV Qn DNA Pl: UNDETERMINED log10 IU/mL

## 2018-08-29 NOTE — Telephone Encounter (Signed)
Patient is checking up on the Libra and omni pod to see if our office has heard back from anyone  Please advise

## 2018-08-29 NOTE — Telephone Encounter (Signed)
Please advise if paperwork has been done for this patient

## 2018-08-29 NOTE — Telephone Encounter (Signed)
Had Robert [name] look and she did not see any paperwork for this pt, do you have it with any of your papers at your desk?

## 2018-08-29 NOTE — Telephone Encounter (Signed)
Returned call to patient echo results given.Follow up appointment scheduled with [facility] 10/20/18 at 11:20 am.

## 2018-08-29 NOTE — Telephone Encounter (Signed)
New message   Patient is calling for echo results.

## 2018-09-01 NOTE — Telephone Encounter (Signed)
lft pt vm to return call to discuss paperwork

## 2018-09-01 NOTE — Telephone Encounter (Signed)
Gave my accordion file to Vicente Males since I can not work on any paperwork

## 2018-09-01 NOTE — Telephone Encounter (Signed)
So she has not seen any paperwork for this pt

## 2018-09-01 NOTE — Telephone Encounter (Signed)
I am unable to see or work on paperwork

## 2018-09-08 ENCOUNTER — Other Ambulatory Visit: Payer: Self-pay | Admitting: Endocrinology

## 2018-09-09 ENCOUNTER — Other Ambulatory Visit: Payer: Self-pay

## 2018-09-09 ENCOUNTER — Telehealth: Payer: Self-pay | Admitting: Endocrinology

## 2018-09-09 MED ORDER — GLUCOSE BLOOD VI STRP: ORAL_STRIP | 1 refills | Status: DC

## 2018-09-09 MED ORDER — INSULIN PEN NEEDLE 31G X 5 MM MISC: 1 refills | Status: DC

## 2018-09-09 NOTE — Telephone Encounter (Signed)
This has been done.

## 2018-09-09 NOTE — Telephone Encounter (Signed)
Insulin Pen Needle (B-D UF III MINI PEN NEEDLES) 31G X 5 MM MISC (he is using two pens per day)  And the Verio ID test strips .  He would like a 90 day supply sent into his pharmacy for the two prescriptions listed above. He stated he called the pharmacy but they told him that this were no longer valid prescriptions.    [id number] - [place], [place] - [address] AT [address]

## 2018-09-10 ENCOUNTER — Telehealth: Payer: Self-pay | Admitting: Cardiovascular Disease

## 2018-09-10 NOTE — Telephone Encounter (Signed)
° °  Patient calling, states he has issue with "ultrasound" he had done, itching and pain carotid area. Requesting call from nurse

## 2018-09-10 NOTE — Telephone Encounter (Signed)
Patient reports tenderness & itching since echo on 11/13. He reports he almost "itches himself raw". He is worried to scratch too hard as he is concerned something may rupture loose in his carotids and cause a stroke. He reports his issues are "deep tissue".   He takes Cymbalta, gabapentin, tramadol already. These do not relieve the deep tissue tenderness or itching. He states that the can follow the tenderness up the line of his carotid.   Suggested that he try topical cortisone or benadryl cream to help with itching but he states what he is already taking is not helping and that his issues are "deep"  Advised would route to MD

## 2018-09-11 NOTE — Telephone Encounter (Signed)
Pt is asking for the PA to be done asap for the test strips we called in for him on 12/3, please update in the system and call the pt to let him know

## 2018-09-11 NOTE — Telephone Encounter (Signed)
Called pharmacy and they are faxing paperwork to perform PA.

## 2018-09-11 NOTE — Telephone Encounter (Signed)
Please advise if PA has been done

## 2018-09-12 NOTE — Telephone Encounter (Signed)
Agree with recommendations.  Can also try Atarax to see if this helps with his pruritus.  If symptoms persist, recommend dermatologic evaluation

## 2018-09-12 NOTE — Telephone Encounter (Signed)
Returned call to patient-patient made aware of recommendations.  He states he is using cortisone cream and does not need Atarax at this time.  Advised if this doesn't improve would recommend PCP or dermatology follow up.  Patient verbalized understanding.

## 2018-09-16 ENCOUNTER — Other Ambulatory Visit: Payer: Self-pay | Admitting: Cardiovascular Disease

## 2018-09-17 ENCOUNTER — Other Ambulatory Visit (INDEPENDENT_AMBULATORY_CARE_PROVIDER_SITE_OTHER): Payer: Medicare Other

## 2018-09-17 DIAGNOSIS — E1165 Type 2 diabetes mellitus with hyperglycemia: Secondary | ICD-10-CM | POA: Diagnosis not present

## 2018-09-17 DIAGNOSIS — E291 Testicular hypofunction: Secondary | ICD-10-CM | POA: Diagnosis not present

## 2018-09-17 DIAGNOSIS — Z794 Long term (current) use of insulin: Secondary | ICD-10-CM

## 2018-09-17 LAB — TESTOSTERONE: TESTOSTERONE: 218.35 ng/dL — AB (ref 300.00–890.00)

## 2018-09-17 LAB — BASIC METABOLIC PANEL
BUN: 20 mg/dL (ref 6–23)
CALCIUM: 9.2 mg/dL (ref 8.4–10.5)
CHLORIDE: 107 meq/L (ref 96–112)
CO2: 25 meq/L (ref 19–32)
Creatinine, Ser: 1.59 mg/dL — ABNORMAL HIGH (ref 0.40–1.50)
GFR: 47.55 mL/min — AB (ref >60.00)
Glucose, Bld: 147 mg/dL — ABNORMAL HIGH (ref 70–99)
POTASSIUM: 4.2 meq/L (ref 3.5–5.1)
Sodium: 138 mEq/L (ref 135–145)

## 2018-09-17 LAB — CBC
HEMATOCRIT: 43.4 % (ref 39.0–52.0)
Hemoglobin: 14.7 g/dL (ref 13.0–17.0)
MCHC: 33.9 g/dL (ref 30.0–36.0)
MCV: 85.7 fl (ref 78.0–100.0)
Platelets: 246 10*3/uL (ref 150.0–400.0)
RBC: 5.06 Mil/uL (ref 4.22–5.81)
RDW: 15 % (ref 11.5–15.5)
WBC: 7 10*3/uL (ref 4.0–10.5)

## 2018-09-18 ENCOUNTER — Other Ambulatory Visit: Payer: Self-pay

## 2018-09-18 ENCOUNTER — Ambulatory Visit (INDEPENDENT_AMBULATORY_CARE_PROVIDER_SITE_OTHER): Payer: Medicare Other | Admitting: Endocrinology

## 2018-09-18 ENCOUNTER — Encounter: Payer: Self-pay | Admitting: Endocrinology

## 2018-09-18 ENCOUNTER — Telehealth: Payer: Self-pay

## 2018-09-18 VITALS — BP 144/78 | HR 76 | Ht 71.0 in | Wt 301.6 lb

## 2018-09-18 DIAGNOSIS — E1142 Type 2 diabetes mellitus with diabetic polyneuropathy: Secondary | ICD-10-CM | POA: Diagnosis not present

## 2018-09-18 DIAGNOSIS — E291 Testicular hypofunction: Secondary | ICD-10-CM | POA: Diagnosis not present

## 2018-09-18 DIAGNOSIS — Z794 Long term (current) use of insulin: Secondary | ICD-10-CM | POA: Diagnosis not present

## 2018-09-18 DIAGNOSIS — E1165 Type 2 diabetes mellitus with hyperglycemia: Secondary | ICD-10-CM

## 2018-09-18 DIAGNOSIS — E039 Hypothyroidism, unspecified: Secondary | ICD-10-CM | POA: Diagnosis not present

## 2018-09-18 MED ORDER — LIRAGLUTIDE 18 MG/3ML ~~LOC~~ SOPN: 1.2000 mg | PEN_INJECTOR | Freq: Every day | SUBCUTANEOUS | 3 refills | Status: DC

## 2018-09-18 NOTE — Telephone Encounter (Signed)
PA submitted via CoverMyMeds for OneTouch Verio Test Strips.  PA submitted as urgent.  SKS:[id number]

## 2018-09-18 NOTE — Patient Instructions (Addendum)
Try dividing the Carbs by 4 to keep sugars < 180 after meals  Test strips under Part D  Check blood sugars on waking up 57days a week  Also check blood sugars about 2 hours after meals and do this after different meals by rotation  Recommended blood sugar levels on waking up are 90-130 and about 2 hours after meal is 130-160  Please bring your blood sugar monitor to each visit, thank you  Robert [name] needs to keep am sugar < 140

## 2018-09-18 NOTE — Progress Notes (Signed)
Patient ID: Robert [name], male   DOB: 03-02-1961, 57 y.o.   MRN: [id number]   Reason for Appointment: Endocrinology follow-up  History of Present Illness   DIABETES diagnosis date:  2012  Previous history: His blood sugar has been difficult to control since onset. Initially was taking oral hypoglycemic drugs like glyburide but could not take metformin because of renal dysfunction. Since Victoza did not help his control he was started on Lantus and then U 500 insulin also His A1c has previously ranged from 7.8-9.8, the lowest level in 03/2013 He was on Victoza but because of his markedly decreased appetite and weight loss this was stopped in 9/15  Recent history:   Insulin regimen: Tresiba 18-20 units daily., Humulin U-500 before meals 1: 5 carbohydrate ratio, usually 20 in the morning, 10 at lunch and 10/20 at dinner  Non-hypoglycemic insulin regimen: Victoza 1.2 mg daily  His A1c is trending higher and was 7.8 done in October Last fructosamine was 284  Current blood sugar patterns, management and problems identified:  He has had difficulty getting his test strips and was told that prior authorization was needed and he has not checked his sugars as much  However he still does not take his INSULIN for meal coverage before eating and will take it AFTER eating  With this his blood sugars are consistently high nonfasting during the day and evening  Highest blood sugar was 469 after drinking regular soft drink  Blood sugars after lunch/before supper are averaging over 200  Also he was told to keep increasing his [name] to get his morning sugar down but he has not done many readings fasting in the last 10 days  Although blood sugars were improving fasting about 3-4 weeks ago they are mostly high again  He is still not exercising  Again frequently he 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          Monitors blood glucose:  recently 2.1 times a day .    Glucometer: One Probation officer.          Blood Glucose readings by download  FASTING average 153 5-7 PM average 225 Late evening average 176 Overall range 82-469   Meals: 2-3 meals per day. 7-8 am and Noon and Dinner 5-7 PM .        Physical activity: exercise: Minimal       Weight control:  Wt Readings from Last 3 Encounters:  09/18/18 (!) 301 lb 9.6 oz (136.8 kg)  08/28/18 (!) 304 lb 1 oz (137.9 kg)  07/30/18 (!) 306 lb 6 oz (096 kg)        Complications:   nephropathy, neuropathy   Diabetes labs:   Lab Results  Component Value Date   HGBA1C 7.8 (H) 07/10/2018   HGBA1C 7.5 (H) 03/06/2018   HGBA1C 7.0 (H) 09/19/2017   Lab Results  Component Value Date   MICROALBUR 3.9 07/08/2018   LDLCALC 95 07/10/2018   CREATININE 1.59 (H) 09/17/2018    Lab Results  Component Value Date   FRUCTOSAMINE 284 05/08/2018   FRUCTOSAMINE 287 (H) 12/04/2017   FRUCTOSAMINE 279 (H) 09/19/2017     Lab on 09/17/2018  Component Date Value Ref Range Status  . WBC 09/17/2018 7.0  4.0 - 10.5 K/uL Final  .  RBC 09/17/2018 5.06  4.22 - 5.81 Mil/uL Final  . Platelets 09/17/2018 246.0  150.0 - 400.0 K/uL Final  . Hemoglobin 09/17/2018 14.7  13.0 - 17.0 g/dL Final  . HCT 09/17/2018 43.4  39.0 - 52.0 % Final  . MCV 09/17/2018 85.7  78.0 - 100.0 fl Final  . MCHC 09/17/2018 33.9  30.0 - 36.0 g/dL Final  . RDW 09/17/2018 15.0  11.5 - 15.5 % Final  . Testosterone 09/17/2018 218.35* 300.00 - 890.00 ng/dL Final  . Sodium 09/17/2018 138  135 - 145 mEq/L Final  . Potassium 09/17/2018 4.2  3.5 - 5.1 mEq/L Final  . Chloride 09/17/2018 107  96 - 112 mEq/L Final  . CO2 09/17/2018 25  19 - 32 mEq/L Final  . Glucose, Bld 09/17/2018 147* 70 - 99 mg/dL Final  . BUN 09/17/2018 20  6 - 23 mg/dL Final  . Creatinine, Ser 09/17/2018  1.59* 0.40 - 1.50 mg/dL Final  . Calcium 09/17/2018 9.2  8.4 - 10.5 mg/dL Final  . GFR 09/17/2018 47.55* >60.00 mL/min Final    OTHER problems: See review of systems    Allergies as of 09/18/2018   No Known Allergies     Medication List       Accurate as of September 18, 2018  9:13 AM. Always use your most recent med list.        acetaminophen 500 MG tablet Commonly known as:  TYLENOL Take 1,000 mg by mouth every 12 (twelve) hours as needed for mild pain.   amLODipine 5 MG tablet Commonly known as:  NORVASC Take 5 mg by mouth daily.   DULoxetine 30 MG capsule Commonly known as:  CYMBALTA Take 30 mg by mouth daily.   FIFTY50 GLUCOSE METER 2.0 w/Device Kit Use as directed   fluocinonide cream 0.05 % Commonly known as:  LIDEX Apply 1 application topically daily as needed (Skin inflammation (hand, thigh, back)).   furosemide 20 MG tablet Commonly known as:  LASIX Take 40 mg by mouth daily.   gabapentin 100 MG capsule Commonly known as:  NEURONTIN Take 200-300 mg by mouth See admin instructions. 200 mg in the morning and 300 mg at bedtime   gentamicin cream 0.1 % Commonly known as:  GARAMYCIN daily as needed.   glucose blood test strip Commonly known as:  ONETOUCH VERIO Use to check blood sugars up to 6 times daily   HUMULIN R 500 UNIT/ML injection Generic drug:  insulin regular human CONCENTRATED Using a U-100 1cc/31G/5/16" INSULIN SYRINGE, draw up and inject 5-18 units into the skin three times a day 30 minutes prior to each meal   insulin glargine 100 UNIT/ML injection Commonly known as:  LANTUS Inject 1.2 Units into the skin daily.   Insulin Pen Needle 31G X 5 MM Misc Commonly known as:  B-D UF III MINI PEN NEEDLES USE 4 PER DAY TO INJECT INSULIN.   Insulin Syringe-Needle U-100 31G X 5/16" 0.3 ML Misc Commonly known as:  BD INSULIN SYRINGE U/F Use to inject insulin 2-3 times per day   BD INSULIN SYRINGE U/F 31G X 5/16" 1 ML Misc Generic drug:   Insulin Syringe-Needle U-100 USE 3 TIMES A DAY WITH HUMULIN R   lactulose 10 GM/15ML solution Commonly known as:  CHRONULAC   levothyroxine 100 MCG tablet Commonly known as:  SYNTHROID, LEVOTHROID TAKE 1 TABLET BY MOUTH EVERY DAY   lisinopril 10 MG tablet Commonly known as:  PRINIVIL,ZESTRIL Take 20 mg by mouth daily.   metoprolol  tartrate 50 MG tablet Commonly known as:  LOPRESSOR TAKE 1 TABLET BY MOUTH TWICE A DAY   mycophenolate 250 MG capsule Commonly known as:  CELLCEPT Take 1,000 mg by mouth every 12 (twelve) hours.   NULOJIX 250 MG Solr injection Generic drug:  belatacept Inject 24.5 mLs into the vein every 28 (twenty-eight) days.   ONETOUCH DELICA LANCETS FINE Misc Use to check blood sugars 4-6 times daily   predniSONE 5 MG tablet Commonly known as:  DELTASONE Take 5 mg by mouth daily with breakfast.   rosuvastatin 10 MG tablet Commonly known as:  CRESTOR TAKE 1 TABLET BY MOUTH EVERY DAY   senna-docusate 8.6-50 MG tablet Commonly known as:  Senokot-S Take 1 tablet by mouth as needed for mild constipation.   tamsulosin 0.4 MG Caps capsule Commonly known as:  FLOMAX Take 0.8 mg by mouth at bedtime.   Testosterone 20.25 MG/ACT (1.62%) Gel Commonly known as:  ANDROGEL PUMP Apply 3 pumps on one arm and 2 on the other every morning on shoulder area   traMADol 50 MG tablet Commonly known as:  ULTRAM Take 1 tablet (50 mg total) by mouth every 6 (six) hours as needed.   traZODone 50 MG tablet Commonly known as:  DESYREL Take 50-100 mg by mouth at bedtime as needed for sleep.   TRESIBA FLEXTOUCH 100 UNIT/ML Sopn FlexTouch Pen Generic drug:  insulin degludec INJECT 30 UNITS INTO THE SKIN EVERY MORNING   UNABLE TO FIND BiPAP: At bedtime   UNABLE TO FIND SENNA PLUS 8.6-50 mg tablet   zolpidem 10 MG tablet Commonly known as:  AMBIEN Take 10-20 mg by mouth at bedtime as needed for sleep.       Allergies: No Known Allergies  Past Medical History:    Diagnosis Date  . Anemia    low hgb at present  . Arthritis    FEET  . Blood transfusion without reported diagnosis 1974   kidney removed - L  . Cancer ([place])    skin ca right shoulder, plastic dsyplasia, pre-Ca polpys removed on Colonoscopy- 07/2014  . Colon polyps 07/23/2014   Tubular adenomas x 5  . Constipation   . Diabetes mellitus without complication (HCC)    Type 2  . Difficult intubation    unsure of actual problem but it was during the January 28, 2015 procedure.  Marland Kitchen Dysrhythmia   . Eczema   . ESRD (end stage renal disease) ([place])    hEMO mwf  . Headache(784.0)    slight dull h/a due kidney failure  . Hyperlipidemia   . Hypertension    SVT h/o , followed by Dr. Claiborne Billings  . Hypothyroidism   . Macular degeneration   . Neuropathy    right hand and both feet  . PAT (paroxysmal atrial tachycardia) ([place])   . PSVT (paroxysmal supraventricular tachycardia) ([place])   . SBO (small bowel obstruction) ([place])   . Shortness of breath    with exertion  . Sleep apnea 06/18/11   split-night sleep study- [facility]., BiPAP- 13-16  . Thyroid disease   . Umbilical hernia    will repair with thi surgery    Past Surgical History:  Procedure Laterality Date  . AV FISTULA PLACEMENT Right 02/25/2015   Procedure: RIGHT ARTERIOVENOUS (AV) FISTULA CREATION;  Surgeon: Serafina Mitchell, MD;  Location: [facility];  Service: Vascular;  Laterality: Right;  . AV FISTULA PLACEMENT Right 09/15/2015   Procedure: ARTERIOVENOUS (AV) FISTULA CREATION- RIGHT ARM;  Surgeon: Serafina Mitchell, MD;  Location: [facility];  Service: Vascular;  Laterality: Right;  . CAPD INSERTION N/A 05/18/2014   Procedure: LAPAROSCOPIC INSERTION CONTINUOUS AMBULATORY PERITONEAL DIALYSIS  (CAPD) CATHETER;  Surgeon: Ralene Ok, MD;  Location: [facility];  Service: General;  Laterality: N/A;  . COLONOSCOPY    . COLONOSCOPY WITH PROPOFOL N/A 07/12/2016   Procedure: COLONOSCOPY WITH PROPOFOL;  Surgeon: [name], MD;   [facility];  Service: Endoscopy;  Laterality: N/A;  . declotting of fistula  08/2015  . ESOPHAGOGASTRODUODENOSCOPY (EGD) WITH PROPOFOL N/A 07/12/2016   Procedure: ESOPHAGOGASTRODUODENOSCOPY (EGD) WITH PROPOFOL;  Surgeon: [name], MD;  [facility];  Service: Endoscopy;  Laterality: N/A;  . FISTULA SUPERFICIALIZATION Right 02/07/2016   Procedure: RIIGHT UPPER ARM FISTULA SUPERFICIALIZATION;  Surgeon: Elam Dutch, MD;  Location: [facility];  Service: Vascular;  Laterality: Right;  . IJ catheter insertion    . INSERTION OF DIALYSIS CATHETER Right 02/25/2015   Procedure: INSERTION OF RIGHT INTERNAL JUGULAR DIALYSIS CATHETER;  Surgeon: Serafina Mitchell, MD;  Location: [facility];  Service: Vascular;  Laterality: Right;  . KIDNEY TRANSPLANT    . LAPAROSCOPIC REPOSITIONING CAPD CATHETER N/A 06/16/2014   Procedure: LAPAROSCOPIC REPOSITIONING CAPD CATHETER;  Surgeon: Ralene Ok, MD;  Location: [facility];  Service: General;  Laterality: N/A;  . LAPAROSCOPY N/A 05/04/2015   Procedure: LAPAROSCOPY DIAGNOSTIC LYSIS OF ADHESIONS;  Surgeon: Coralie Keens, MD;  Location: [facility];  Service: General;  Laterality: N/A;  . MINOR REMOVAL OF PERITONEAL DIALYSIS CATHETER N/A 04/28/2015   Procedure:  REMOVAL OF PERITONEAL DIALYSIS CATHETER;  Surgeon: Coralie Keens, MD;  Location: [place] [facility];  Service: General;  Laterality: N/A;  . NEPHRECTOMY Left 1974  . RENAL BIOPSY Right 2012  . REVISON OF ARTERIOVENOUS FISTULA Right 05/26/2015   Procedure: SUPERFICIALIZATION OF ARTERIOVENOUS FISTULA WITH SIDE BRANCH LIGATIONS;  Surgeon: Serafina Mitchell, MD;  Location: [facility];  Service: Vascular;  Laterality: Right;  . SKIN CANCER EXCISION     right shoulder  . SVT ABLATION N/A 11/23/2016   Procedure: SVT Ablation;  Surgeon: Will Meredith Leeds, MD;  Location: [facility];  Service: Cardiovascular;  Laterality: N/A;  . UMBILICAL HERNIA REPAIR N/A 05/18/2014   Procedure: HERNIA REPAIR UMBILICAL ADULT;  Surgeon:  Ralene Ok, MD;  Location: [facility];  Service: General;  Laterality: N/A;  . UMBILICAL HERNIA REPAIR N/A 04/28/2015   Procedure: UMBILICAL HERNIA REPAIR WITH MESH;  Surgeon: Coralie Keens, MD;  Location[facility];  Service: General;  Laterality: N/A;    Family History  Problem Relation Age of Onset  . Colon polyps Father   . Colon cancer Neg Hx   . Stomach cancer Neg Hx     Social History:  reports that he quit smoking about 5 years ago. His smoking use included cigars. He has never used smokeless tobacco. He reports current alcohol use. He reports that he does not use drugs.  Review of Systems:   Hypertension:    His blood pressure has been managed by his nephrologist and transplant team Also monitors at home  BP Readings from Last 3 Encounters:  09/18/18 (!) 144/78  08/28/18 (!) 120/45  07/30/18 127/61    Lipids:  His LDL is below 100 with 10 mg Crestor Has no evidence of vascular disease    Lab Results  Component Value Date   CHOL 166 07/10/2018   CHOL 137 05/28/2017   CHOL 130 11/13/2016   Lab Results  Component  Value Date   HDL 54.20 07/10/2018   HDL 49.60 05/28/2017   HDL 41.40 11/13/2016   Lab Results  Component Value Date   LDLCALC 95 07/10/2018   LDLCALC 73 05/28/2017   LDLCALC 68 11/13/2016   Lab Results  Component Value Date   TRIG 86.0 07/10/2018   TRIG 71.0 05/28/2017   TRIG 102.0 11/13/2016   Lab Results  Component Value Date   CHOLHDL 3 07/10/2018   CHOLHDL 3 05/28/2017   CHOLHDL 3 11/13/2016   No results found for: LDLDIRECT   HYPOTHYROIDISM: He has had long-standing hypothyroidism  Has been taking his 100 g levothyroxine before breakfast daily  He still complains of feeling tired and give out easily.  He also may tend to sleep more on the weekends  TSH normal  Lab Results  Component Value Date   TSH 1.49 07/10/2018       HYPOGONADISM: 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 metabolic syndrome  and previously had subjectively improved with supplementation using Fortesta  He was having fatigue and decreased motivation and because of his free testosterone level being low at 3.7 he was prescribed AndroGel in February 2019 His testosterone level has been inconsistent Now taking 3 pumps on one arm and 2 on the other with previously improvement in the total testosterone level  However recently forgot to fill his prescription and his level has gone down  Lab Results  Component Value Date   TESTOSTERONE 218.35 (L) 09/17/2018   TESTOSTERONE 422 07/10/2018   TESTOSTERONE 326.83 05/08/2018     Diabetic neuropathy: Has mild sensory loss on his last exam    Examination:   BP (!) 144/78 (BP Location: Left Arm, Patient Position: Sitting, Cuff Size: Large)   Pulse 76   Ht '5\' 11"'$  (1.803 m)   Wt (!) 301 lb 9.6 oz (136.8 kg)   SpO2 96%   BMI 42.06 kg/m   Body mass index is 42.06 kg/m.    ASSESSMENT/ PLAN:    Diabetes type 2 with obesity, insulin-requiring:   See history of present illness for detailed discussion of his current management, problems identified and sugar patterns on his meter download  Currently on a regimen of Tresiba and mealtime U-500 insulin with syringe  His A1c was last 7.8  His main problem is not getting enough mealtime insulin This is partly because of his taking the insulin postprandially instead of 30 minutes before He is also probably not calculating the doses accurately based on carbohydrate intake He says that he will not take his insulin consistently because he cannot check his sugar as much recently with not having his test strips at the pharmacy Overall his blood sugars are after breakfast and lunch and not so much in the evenings  Recommendations:  Consistent monitoring up to 4 times a day which may make him eligible for the freestyle libre  We will need to look at the insulin pump status  To try 1: 4 coverage for carbohydrates and try to  take the Humulin R 30 minutes before meals  Low-fat diet especially when eating out  For now will not increase [name] since some of his morning sugars have been fairly good low-fat diet as much as possible  Make correction for high readings also at mealtimes  Follow-up in 2 months  HYPOGONADISM: He has a low level of testosterone because of recently not refilling his medication He will go ahead and get it refilled and continue with 5 pumps a  day which was adequate previously  Hypothyroidism: Has been controlled with normal TSH  There are no Patient Instructions on file for this visit.  Counseling time on subjects discussed in the diabetes evaluation and management sections is over 50% of today's 25 minute visit    Elayne Snare 09/18/2018, 9:13 AM

## 2018-09-19 ENCOUNTER — Telehealth: Payer: Self-pay

## 2018-09-19 ENCOUNTER — Other Ambulatory Visit: Payer: Self-pay

## 2018-09-19 LAB — FRUCTOSAMINE: Fructosamine: 278 umol/L (ref 0–285)

## 2018-09-19 IMAGING — CR DG CHEST 1V PORT
1 series · 1 of 1 positions shown · non-contrast
Comparison: 05/10/2015

CLINICAL DATA: Cardiac a arrhythmias tonight.

EXAM:
PORTABLE CHEST 1 VIEW

[AP]
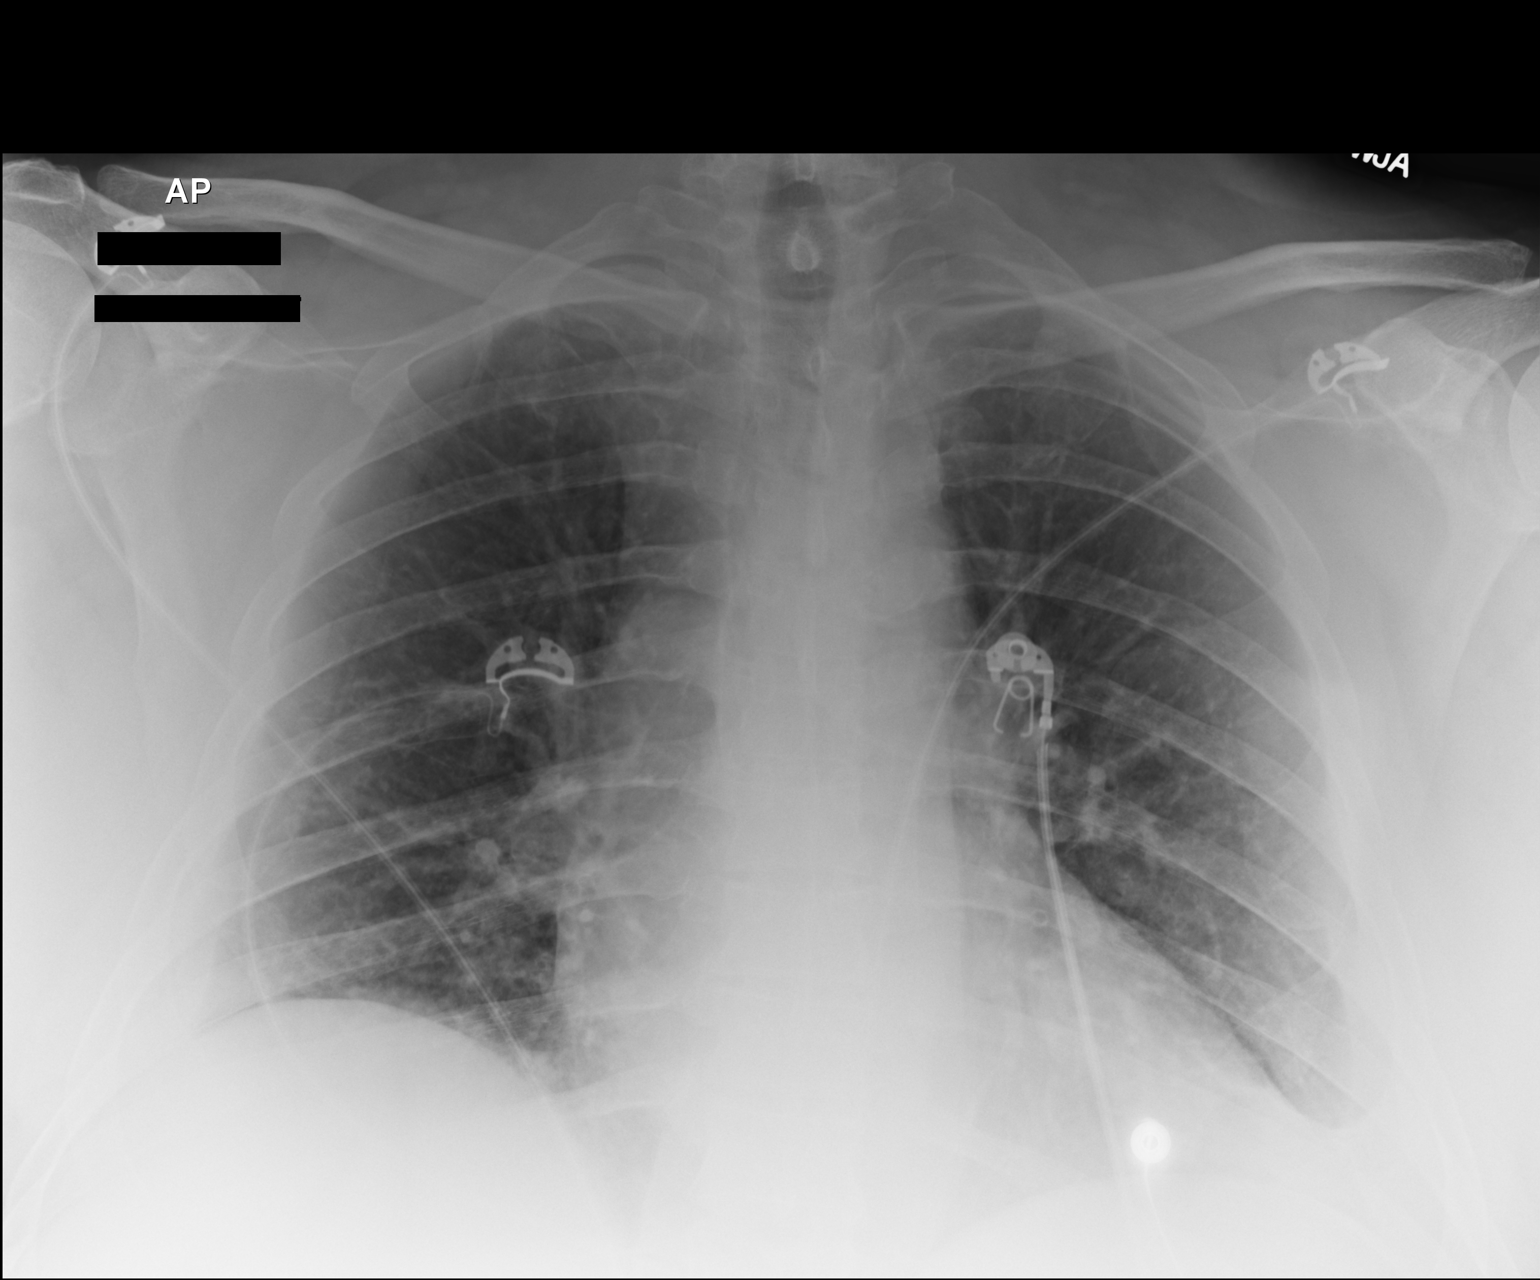

[1 of 1 positions shown; findings below may reference images not displayed]

FINDINGS: A single AP portable view of the chest demonstrates no focal
airspace consolidation or alveolar edema. The lungs are grossly
clear. Chronic blunting of the costophrenic angles, unchanged. There
is no large effusion or pneumothorax. Cardiac and mediastinal
contours appear unremarkable.
IMPRESSION: No acute disease.

## 2018-09-19 MED ORDER — GLUCOSE BLOOD VI STRP: 1.0000 | ORAL_STRIP | 3 refills | Status: DC | PRN

## 2018-09-19 MED ORDER — ACCU-CHEK FASTCLIX LANCET KIT: 1.0000 | PACK | Freq: Every day | 0 refills | Status: DC

## 2018-09-19 MED ORDER — ACCU-CHEK GUIDE W/DEVICE KIT: 1.0000 | PACK | Freq: Every day | 0 refills | Status: DC

## 2018-09-19 MED ORDER — ACCU-CHEK FASTCLIX LANCETS MISC: 3 refills | Status: DC

## 2018-09-19 NOTE — Telephone Encounter (Signed)
New Rx sent.

## 2018-09-19 NOTE — Telephone Encounter (Signed)
We do not need to do prior authorization for glucose testing and use the preferred brand. He needs a prescription for Accu-Chek guide meter and test strips checking 5 times a day, 90-day supply

## 2018-09-19 NOTE — Telephone Encounter (Signed)
Received fax from [facility] of Robert [name] stating that pt was denied coverage for OneTouch verio test strip, testing 6 times daily. Reason for deny is because the patient does not have a disability preventing him from using the preferred test strip.  How would you like to proceed Dr. Dwyane Dee?

## 2018-09-19 NOTE — Telephone Encounter (Signed)
What is the preferred strip

## 2018-09-19 NOTE — Telephone Encounter (Signed)
Accu Chek.

## 2018-09-22 ENCOUNTER — Ambulatory Visit: Payer: Medicare Other | Admitting: Endocrinology

## 2018-09-25 ENCOUNTER — Ambulatory Visit (HOSPITAL_COMMUNITY)
Admission: RE | Admit: 2018-09-25 | Discharge: 2018-09-25 | Disposition: A | Discharge status: Home / Self Care | Payer: Medicare Other | Source: Ambulatory Visit | Attending: Internal Medicine | Admitting: Internal Medicine

## 2018-09-25 DIAGNOSIS — Z940 Kidney transplant status: Secondary | ICD-10-CM | POA: Diagnosis not present

## 2018-09-25 LAB — CBC
HCT: 41.8 % (ref 39.0–52.0)
Hemoglobin: 13.2 g/dL (ref 13.0–17.0)
MCH: 27.5 pg (ref 26.0–34.0)
MCHC: 31.6 g/dL (ref 30.0–36.0)
MCV: 87.1 fL (ref 80.0–100.0)
Platelets: 251 10*3/uL (ref 150–400)
RBC: 4.8 MIL/uL (ref 4.22–5.81)
RDW: 13.9 % (ref 11.5–15.5)
WBC: 7.6 10*3/uL (ref 4.0–10.5)
nRBC: 0 % (ref 0.0–0.2)

## 2018-09-25 LAB — BASIC METABOLIC PANEL
Anion gap: 12 (ref 5–15)
BUN: 21 mg/dL — ABNORMAL HIGH (ref 6–20)
CO2: 19 mmol/L — ABNORMAL LOW (ref 22–32)
Calcium: 9.1 mg/dL (ref 8.9–10.3)
Chloride: 104 mmol/L (ref 98–111)
Creatinine, Ser: 1.69 mg/dL — ABNORMAL HIGH (ref 0.61–1.24)
GFR calc non Af Amer: 43 mL/min — ABNORMAL LOW (ref >60)
GFR, EST AFRICAN AMERICAN: 50 mL/min — AB (ref >60)
Glucose, Bld: 351 mg/dL — ABNORMAL HIGH (ref 70–99)
Potassium: 4.1 mmol/L (ref 3.5–5.1)
Sodium: 135 mmol/L (ref 135–145)

## 2018-09-25 LAB — URINALYSIS, COMPLETE (UACMP) WITH MICROSCOPIC
Bacteria, UA: NONE SEEN
Bilirubin Urine: NEGATIVE
Glucose, UA: >=500 mg/dL — AB
Ketones, ur: NEGATIVE mg/dL
Leukocytes, UA: NEGATIVE
Nitrite: NEGATIVE
Protein, ur: 100 mg/dL — AB
Specific Gravity, Urine: 1.025 (ref 1.005–1.030)
pH: 6 (ref 5.0–8.0)

## 2018-09-25 LAB — PROTEIN / CREATININE RATIO, URINE
Creatinine, Urine: 109.7 mg/dL
Protein Creatinine Ratio: 3.15 mg/mg{Cre} — ABNORMAL HIGH (ref 0.00–0.15)
Total Protein, Urine: 346 mg/dL

## 2018-09-25 MED ORDER — SODIUM CHLORIDE 0.9 % IV SOLN
5.0000 mg/kg | INTRAVENOUS | Status: DC
  Administered 2018-09-25: 700 mg via INTRAVENOUS
  Filled 2018-09-25: qty 700

## 2018-09-28 LAB — BK QUANT PCR (PLASMA/SERUM)
BK Quantitaion PCR: NEGATIVE copies/mL
Log10 BK Qn PCR: UNDETERMINED log10copy/mL

## 2018-09-29 LAB — CMV DNA, QUANTITATIVE, PCR: CMV DNA Quant: UNDETERMINED IU/mL

## 2018-10-02 ENCOUNTER — Telehealth: Payer: Self-pay | Admitting: Endocrinology

## 2018-10-02 NOTE — Telephone Encounter (Signed)
[facility] ph# [phone], ext [phone] called re: status of a fax that was sent to Dr. Dwyane Dee on 09/29/18 Omnipod. Please call the above ph# to give status (working on getting the patient the Omnipod supplies before end of year)

## 2018-10-02 NOTE — Telephone Encounter (Signed)
Called Robert [name] with [facility] [employer] and she stated that she needs a letter of Medical Necessity and a copy of the chart notes. These forms will be faxed and this will be done and sent back.

## 2018-10-06 ENCOUNTER — Telehealth: Payer: Self-pay

## 2018-10-06 NOTE — Telephone Encounter (Signed)
Pt called back and was informed that Robert [name] has signed CMN from [facility] and they are processing his order for the [place].  Pt asked that we now look into why he cannot get the freestyle libre.

## 2018-10-06 NOTE — Telephone Encounter (Signed)
Received fax from [facility] stating that the pt would like a call back from me. The fax was received this morning and the call was placed at 5:45pm on 10/03/18 after the office was closed. Pt was called back but no answer was received and the pt's voicemail box was full at this time, so I was unable to leave a message. Will attempt to call pt back at a later time.

## 2018-10-07 DIAGNOSIS — L03115 Cellulitis of right lower limb: Secondary | ICD-10-CM | POA: Diagnosis not present

## 2018-10-07 DIAGNOSIS — M7989 Other specified soft tissue disorders: Secondary | ICD-10-CM | POA: Diagnosis not present

## 2018-10-07 DIAGNOSIS — E1129 Type 2 diabetes mellitus with other diabetic kidney complication: Secondary | ICD-10-CM | POA: Diagnosis not present

## 2018-10-07 DIAGNOSIS — M79604 Pain in right leg: Secondary | ICD-10-CM | POA: Diagnosis not present

## 2018-10-10 ENCOUNTER — Telehealth: Payer: Self-pay | Admitting: Endocrinology

## 2018-10-10 NOTE — Telephone Encounter (Signed)
Patient has dropped off BCBS, Medicare, Prescription Cards to be scanned into the system.

## 2018-10-13 ENCOUNTER — Telehealth: Payer: Self-pay

## 2018-10-13 NOTE — Telephone Encounter (Signed)
PA initiated for [facility] 14 day sensor and reader. KEY for Reader:[id number] KEY for Sensor:[id number]

## 2018-10-15 ENCOUNTER — Telehealth: Payer: Self-pay | Admitting: Cardiovascular Disease

## 2018-10-15 NOTE — Telephone Encounter (Signed)
Returned a call to patient. He called to be sure that when he comes for his appointment next week that Dr Claiborne Billings will be able to have access to his BIPAP download, so that he can discuss some sleep issues he has been having. I assured him that Dr Claiborne Billings will have a download report at the time of his appointment. Patient thanked me for returning his call.

## 2018-10-15 NOTE — Telephone Encounter (Signed)
° ° °  Patient calling with questions about bipap. Declined to give additional details. Please call

## 2018-10-20 ENCOUNTER — Ambulatory Visit: Payer: Medicare Other | Admitting: Cardiovascular Disease

## 2018-10-22 ENCOUNTER — Other Ambulatory Visit (HOSPITAL_COMMUNITY): Payer: Self-pay | Admitting: *Deleted

## 2018-10-23 ENCOUNTER — Ambulatory Visit (HOSPITAL_COMMUNITY)
Admission: RE | Admit: 2018-10-23 | Discharge: 2018-10-23 | Disposition: A | Discharge status: Home / Self Care | Payer: Medicare Other | Source: Ambulatory Visit | Attending: Internal Medicine | Admitting: Internal Medicine

## 2018-10-23 DIAGNOSIS — Z940 Kidney transplant status: Secondary | ICD-10-CM | POA: Insufficient documentation

## 2018-10-23 LAB — BASIC METABOLIC PANEL
Anion gap: 10 (ref 5–15)
BUN: 20 mg/dL (ref 6–20)
CO2: 22 mmol/L (ref 22–32)
CREATININE: 1.58 mg/dL — AB (ref 0.61–1.24)
Calcium: 9.3 mg/dL (ref 8.9–10.3)
Chloride: 108 mmol/L (ref 98–111)
GFR calc non Af Amer: 47 mL/min — ABNORMAL LOW (ref >60)
GFR, EST AFRICAN AMERICAN: 55 mL/min — AB (ref >60)
Glucose, Bld: 141 mg/dL — ABNORMAL HIGH (ref 70–99)
Potassium: 3.6 mmol/L (ref 3.5–5.1)
Sodium: 140 mmol/L (ref 135–145)

## 2018-10-23 LAB — CBC
HCT: 44.3 % (ref 39.0–52.0)
Hemoglobin: 14.1 g/dL (ref 13.0–17.0)
MCH: 27.4 pg (ref 26.0–34.0)
MCHC: 31.8 g/dL (ref 30.0–36.0)
MCV: 86 fL (ref 80.0–100.0)
Platelets: 240 10*3/uL (ref 150–400)
RBC: 5.15 MIL/uL (ref 4.22–5.81)
RDW: 14.1 % (ref 11.5–15.5)
WBC: 7 10*3/uL (ref 4.0–10.5)
nRBC: 0 % (ref 0.0–0.2)

## 2018-10-23 LAB — URINALYSIS, COMPLETE (UACMP) WITH MICROSCOPIC
BACTERIA UA: NONE SEEN
Bilirubin Urine: NEGATIVE
Glucose, UA: NEGATIVE mg/dL
Ketones, ur: NEGATIVE mg/dL
Leukocytes, UA: NEGATIVE
Nitrite: NEGATIVE
Protein, ur: 100 mg/dL — AB
Specific Gravity, Urine: 1.019 (ref 1.005–1.030)
pH: 5 (ref 5.0–8.0)

## 2018-10-23 LAB — PROTEIN / CREATININE RATIO, URINE
Creatinine, Urine: 116.82 mg/dL
PROTEIN CREATININE RATIO: 2.16 mg/mg{creat} — AB (ref 0.00–0.15)
TOTAL PROTEIN, URINE: 252 mg/dL

## 2018-10-23 MED ORDER — SODIUM CHLORIDE 0.9 % IV SOLN
5.0000 mg/kg | INTRAVENOUS | Status: DC
  Administered 2018-10-23: 687.5 mg via INTRAVENOUS
  Filled 2018-10-23: qty 687.5

## 2018-10-24 ENCOUNTER — Encounter: Payer: Self-pay | Admitting: Cardiovascular Disease

## 2018-10-24 DIAGNOSIS — Z940 Kidney transplant status: Secondary | ICD-10-CM | POA: Diagnosis not present

## 2018-10-24 LAB — BK QUANT PCR (PLASMA/SERUM)
BK Quantitaion PCR: NEGATIVE copies/mL
Log10 BK Qn PCR: UNDETERMINED log10copy/mL

## 2018-10-26 LAB — CMV DNA, QUANTITATIVE, PCR
CMV DNA Quant: NEGATIVE IU/mL
Log10 CMV Qn DNA Pl: UNDETERMINED log10 IU/mL

## 2018-10-30 NOTE — Telephone Encounter (Signed)
Received fax from pt's insurance stating that the patient has been denied coverage for the [facility] reader and sensor.  Insurance stated that they would like pt to try the Dexcom first as it is a covered item. Pt was called and made aware of this, and he verbalized understanding. Would you like to prescribe this instead of the freestyle libre?

## 2018-10-30 NOTE — Telephone Encounter (Signed)
He can use the Dexcom sensor, will have to be instructed by Vaughan Basta to start this.  Not clear where his insurance wants him to get this from

## 2018-10-31 ENCOUNTER — Other Ambulatory Visit: Payer: Self-pay

## 2018-10-31 MED ORDER — OMNIPOD DASH PODS (GEN 4) MISC: 1.0000 | 3 refills | Status: DC

## 2018-10-31 MED ORDER — OMNIPOD DASH PDM (GEN 4) KIT: 1.0000 | PACK | 0 refills | Status: DC

## 2018-10-31 NOTE — Telephone Encounter (Signed)
Spoke with pt and stated that he would call his insurance and find out if they prefer a retail pharmacy or DME supplier for the [place].

## 2018-11-05 ENCOUNTER — Telehealth: Payer: Self-pay | Admitting: Endocrinology

## 2018-11-05 NOTE — Telephone Encounter (Signed)
Patient called to advise that he has not yet been able to obtain information requested from our office.  I advised that I would pass that information on to clinical staff and advised to continue efforts to get information

## 2018-11-12 ENCOUNTER — Other Ambulatory Visit (INDEPENDENT_AMBULATORY_CARE_PROVIDER_SITE_OTHER): Payer: Medicare Other

## 2018-11-12 DIAGNOSIS — E039 Hypothyroidism, unspecified: Secondary | ICD-10-CM

## 2018-11-12 DIAGNOSIS — E291 Testicular hypofunction: Secondary | ICD-10-CM | POA: Diagnosis not present

## 2018-11-12 DIAGNOSIS — E1165 Type 2 diabetes mellitus with hyperglycemia: Secondary | ICD-10-CM

## 2018-11-12 DIAGNOSIS — Z794 Long term (current) use of insulin: Secondary | ICD-10-CM | POA: Diagnosis not present

## 2018-11-12 LAB — TSH: TSH: 0.74 u[IU]/mL (ref 0.35–4.50)

## 2018-11-12 LAB — COMPREHENSIVE METABOLIC PANEL
ALT: 20 U/L (ref 0–53)
AST: 15 U/L (ref 0–37)
Albumin: 4.3 g/dL (ref 3.5–5.2)
Alkaline Phosphatase: 113 U/L (ref 39–117)
BUN: 20 mg/dL (ref 6–23)
CO2: 28 mEq/L (ref 19–32)
Calcium: 9.1 mg/dL (ref 8.4–10.5)
Chloride: 102 mEq/L (ref 96–112)
Creatinine, Ser: 1.69 mg/dL — ABNORMAL HIGH (ref 0.40–1.50)
GFR: 41.67 mL/min — ABNORMAL LOW (ref >60.00)
Glucose, Bld: 208 mg/dL — ABNORMAL HIGH (ref 70–99)
Potassium: 4.9 mEq/L (ref 3.5–5.1)
Sodium: 138 mEq/L (ref 135–145)
Total Bilirubin: 0.8 mg/dL (ref 0.2–1.2)
Total Protein: 5.9 g/dL — ABNORMAL LOW (ref 6.0–8.3)

## 2018-11-12 LAB — TESTOSTERONE: Testosterone: 292.89 ng/dL — ABNORMAL LOW (ref 300.00–890.00)

## 2018-11-12 LAB — HEMOGLOBIN A1C: Hgb A1c MFr Bld: 7.2 % — ABNORMAL HIGH (ref 4.6–6.5)

## 2018-11-14 ENCOUNTER — Telehealth: Payer: Self-pay

## 2018-11-14 NOTE — Telephone Encounter (Signed)
Reached out to [place] from [facility]. Currently waiting on response.

## 2018-11-14 NOTE — Telephone Encounter (Signed)
Please have him call Wilfred Lacy with [employer] to verify.  Otherwise he can look into the T-Slim pump, he can be given the phone number to call

## 2018-11-14 NOTE — Telephone Encounter (Signed)
Patient called and stated that he finally received notice from the pharmacy he will be receiving the OmniPod from and they informed him that his insurance does not cover the PDM and it will cost $500. Pt wanted to know other alternatives.

## 2018-11-17 NOTE — Telephone Encounter (Signed)
Was contacted by Robert [name] regarding this issue. He is currently verifying this information.

## 2018-11-18 ENCOUNTER — Other Ambulatory Visit: Payer: Self-pay | Admitting: Endocrinology

## 2018-11-19 ENCOUNTER — Other Ambulatory Visit: Payer: Medicare Other

## 2018-11-20 ENCOUNTER — Ambulatory Visit (HOSPITAL_COMMUNITY)
Admission: RE | Admit: 2018-11-20 | Discharge: 2018-11-20 | Disposition: A | Discharge status: Home / Self Care | Payer: Medicare Other | Source: Ambulatory Visit | Attending: Internal Medicine | Admitting: Internal Medicine

## 2018-11-20 ENCOUNTER — Other Ambulatory Visit: Payer: Medicare Other

## 2018-11-20 DIAGNOSIS — Z940 Kidney transplant status: Secondary | ICD-10-CM | POA: Diagnosis not present

## 2018-11-20 LAB — CBC
HCT: 45.9 % (ref 39.0–52.0)
Hemoglobin: 15 g/dL (ref 13.0–17.0)
MCH: 28.4 pg (ref 26.0–34.0)
MCHC: 32.7 g/dL (ref 30.0–36.0)
MCV: 86.8 fL (ref 80.0–100.0)
Platelets: 236 10*3/uL (ref 150–400)
RBC: 5.29 MIL/uL (ref 4.22–5.81)
RDW: 14.5 % (ref 11.5–15.5)
WBC: 6.6 10*3/uL (ref 4.0–10.5)
nRBC: 0 % (ref 0.0–0.2)

## 2018-11-20 LAB — URINALYSIS, COMPLETE (UACMP) WITH MICROSCOPIC
Bacteria, UA: NONE SEEN
Bilirubin Urine: NEGATIVE
Glucose, UA: >=500 mg/dL — AB
Ketones, ur: NEGATIVE mg/dL
Leukocytes,Ua: NEGATIVE
Nitrite: NEGATIVE
PROTEIN: 100 mg/dL — AB
Specific Gravity, Urine: 1.023 (ref 1.005–1.030)
pH: 5 (ref 5.0–8.0)

## 2018-11-20 LAB — BASIC METABOLIC PANEL
Anion gap: 13 (ref 5–15)
BUN: 20 mg/dL (ref 6–20)
CO2: 21 mmol/L — ABNORMAL LOW (ref 22–32)
Calcium: 9.2 mg/dL (ref 8.9–10.3)
Chloride: 106 mmol/L (ref 98–111)
Creatinine, Ser: 1.6 mg/dL — ABNORMAL HIGH (ref 0.61–1.24)
GFR calc Af Amer: 54 mL/min — ABNORMAL LOW (ref >60)
GFR calc non Af Amer: 46 mL/min — ABNORMAL LOW (ref >60)
Glucose, Bld: 216 mg/dL — ABNORMAL HIGH (ref 70–99)
Potassium: 3.9 mmol/L (ref 3.5–5.1)
Sodium: 140 mmol/L (ref 135–145)

## 2018-11-20 LAB — PROTEIN / CREATININE RATIO, URINE
CREATININE, URINE: 153.44 mg/dL
Protein Creatinine Ratio: 2.65 mg/mg{Cre} — ABNORMAL HIGH (ref 0.00–0.15)
Total Protein, Urine: 407 mg/dL

## 2018-11-20 MED ORDER — SODIUM CHLORIDE 0.9 % IV SOLN
612.5000 mg | INTRAVENOUS | Status: DC
  Administered 2018-11-20: 612.5 mg via INTRAVENOUS
  Filled 2018-11-20: qty 612.5

## 2018-11-21 DIAGNOSIS — D899 Disorder involving the immune mechanism, unspecified: Secondary | ICD-10-CM | POA: Diagnosis not present

## 2018-11-21 DIAGNOSIS — Z940 Kidney transplant status: Secondary | ICD-10-CM | POA: Diagnosis not present

## 2018-11-21 DIAGNOSIS — I10 Essential (primary) hypertension: Secondary | ICD-10-CM | POA: Diagnosis not present

## 2018-11-21 LAB — CMV DNA, QUANTITATIVE, PCR
CMV DNA Quant: NEGATIVE IU/mL
Log10 CMV Qn DNA Pl: UNDETERMINED log10 IU/mL

## 2018-11-21 LAB — BK QUANT PCR (PLASMA/SERUM)
BK Quantitaion PCR: NEGATIVE copies/mL
Log10 BK Qn PCR: UNDETERMINED log10copy/mL

## 2018-11-26 ENCOUNTER — Ambulatory Visit (INDEPENDENT_AMBULATORY_CARE_PROVIDER_SITE_OTHER): Payer: Medicare Other | Admitting: Podiatry

## 2018-11-26 ENCOUNTER — Encounter: Payer: Self-pay | Admitting: Podiatry

## 2018-11-26 DIAGNOSIS — B351 Tinea unguium: Secondary | ICD-10-CM

## 2018-11-26 DIAGNOSIS — M79674 Pain in right toe(s): Secondary | ICD-10-CM | POA: Diagnosis not present

## 2018-11-26 DIAGNOSIS — M79675 Pain in left toe(s): Secondary | ICD-10-CM

## 2018-11-26 DIAGNOSIS — E1149 Type 2 diabetes mellitus with other diabetic neurological complication: Secondary | ICD-10-CM

## 2018-11-26 NOTE — Progress Notes (Signed)
Patient ID: Robert [name], male   DOB: 1961-04-06, 58 y.o.   MRN: [id number] Complaint:  Visit Type: Patient returns to my office for continued preventative foot care services. Complaint: Patient states" my nails have grown long and thick and become painful to walk and wear shoes" Patient has been diagnosed with DM and had a kidney transplant right side.  This is leading to increased leg swelling left.  . The patient presents for preventative foot care services. No changes to ROS  Podiatric Exam: Vascular: dorsalis pedis and posterior tibial pulses are palpable bilateral. Capillary return is immediate. Temperature gradient is WNL. Skin turgor WNL  Sensorium: Diminished  Semmes Weinstein monofilament test. Normal tactile sensation bilaterally. Nail Exam: Pt has thick disfigured discolored nails with subungual debris noted bilateral entire nail hallux through fifth toenails Ulcer Exam: There is no evidence of ulcer or pre-ulcerative changes or infection. Orthopedic Exam: Muscle tone and strength are WNL. No limitations in general ROM. No crepitus or effusions noted. Foot type and digits show no abnormalities. Bony prominences are unremarkable. HAV  B/L Skin: No Porokeratosis. No infection or ulcers  Diagnosis:  Onychomycosis, , Pain in right toe, pain in left toes, Diabetic neuropathy  Hallux limitus 1st MPJ  B/L  Treatment & Plan Procedures and Treatment: Consent by patient was obtained for treatment procedures. The patient understood the discussion of treatment and procedures well. All questions were answered thoroughly reviewed. Debridement of mycotic and hypertrophic toenails, 1 through 5 bilateral and clearing of subungual debris. No ulceration, no infection noted.  Return Visit-Office Procedure: Patient instructed to return to the office for a follow up visit 3 months for continued evaluation and treatment.    Gardiner Barefoot DPM

## 2018-11-28 ENCOUNTER — Telehealth: Payer: Self-pay | Admitting: Endocrinology

## 2018-11-28 ENCOUNTER — Ambulatory Visit: Payer: Medicare Other | Admitting: Endocrinology

## 2018-11-28 NOTE — Telephone Encounter (Signed)
Called patient PM of 11/27/2018 to advise of late opening due to inclement weather.  Left message advised that we would call to reschedule.   2nd call made 11/28/2018 to reschedule no answer, so left message for patient to call us back at their convenience to reschedule

## 2018-12-16 ENCOUNTER — Other Ambulatory Visit: Payer: Self-pay

## 2018-12-16 MED ORDER — DEXCOM G6 TRANSMITTER MISC: 1.0000 | 4 refills | Status: DC

## 2018-12-16 MED ORDER — DEXCOM G6 RECEIVER DEVI: 1.0000 | 0 refills | Status: DC

## 2018-12-16 MED ORDER — DEXCOM G6 SENSOR MISC: 1.0000 | 4 refills | Status: DC

## 2018-12-18 ENCOUNTER — Other Ambulatory Visit: Payer: Self-pay

## 2018-12-18 ENCOUNTER — Ambulatory Visit (HOSPITAL_COMMUNITY)
Admission: RE | Admit: 2018-12-18 | Discharge: 2018-12-18 | Disposition: A | Discharge status: Home / Self Care | Payer: Medicare Other | Source: Ambulatory Visit | Attending: Internal Medicine | Admitting: Internal Medicine

## 2018-12-18 DIAGNOSIS — Z940 Kidney transplant status: Secondary | ICD-10-CM | POA: Insufficient documentation

## 2018-12-18 LAB — CBC
HCT: 46 % (ref 39.0–52.0)
Hemoglobin: 14.4 g/dL (ref 13.0–17.0)
MCH: 27.5 pg (ref 26.0–34.0)
MCHC: 31.3 g/dL (ref 30.0–36.0)
MCV: 88 fL (ref 80.0–100.0)
Platelets: 237 10*3/uL (ref 150–400)
RBC: 5.23 MIL/uL (ref 4.22–5.81)
RDW: 14.1 % (ref 11.5–15.5)
WBC: 9.5 10*3/uL (ref 4.0–10.5)
nRBC: 0 % (ref 0.0–0.2)

## 2018-12-18 LAB — URINALYSIS, COMPLETE (UACMP) WITH MICROSCOPIC
BILIRUBIN URINE: NEGATIVE
Bacteria, UA: NONE SEEN
Glucose, UA: 150 mg/dL — AB
KETONES UR: NEGATIVE mg/dL
Leukocytes,Ua: NEGATIVE
Nitrite: NEGATIVE
PROTEIN: 100 mg/dL — AB
Specific Gravity, Urine: 1.023 (ref 1.005–1.030)
pH: 5 (ref 5.0–8.0)

## 2018-12-18 LAB — BASIC METABOLIC PANEL
Anion gap: 6 (ref 5–15)
BUN: 22 mg/dL — ABNORMAL HIGH (ref 6–20)
CO2: 22 mmol/L (ref 22–32)
CREATININE: 1.77 mg/dL — AB (ref 0.61–1.24)
Calcium: 9.1 mg/dL (ref 8.9–10.3)
Chloride: 108 mmol/L (ref 98–111)
GFR calc Af Amer: 48 mL/min — ABNORMAL LOW (ref >60)
GFR calc non Af Amer: 41 mL/min — ABNORMAL LOW (ref >60)
Glucose, Bld: 203 mg/dL — ABNORMAL HIGH (ref 70–99)
Potassium: 3.9 mmol/L (ref 3.5–5.1)
Sodium: 136 mmol/L (ref 135–145)

## 2018-12-18 LAB — URINALYSIS, ROUTINE W REFLEX MICROSCOPIC
Bacteria, UA: NONE SEEN
Bilirubin Urine: NEGATIVE
Glucose, UA: 150 mg/dL — AB
Ketones, ur: NEGATIVE mg/dL
Leukocytes,Ua: NEGATIVE
Nitrite: NEGATIVE
Protein, ur: 100 mg/dL — AB
Specific Gravity, Urine: 1.023 (ref 1.005–1.030)
pH: 5 (ref 5.0–8.0)

## 2018-12-18 LAB — PROTEIN / CREATININE RATIO, URINE
CREATININE, URINE: 152.9 mg/dL
Protein Creatinine Ratio: 2.1 mg/mg{Cre} — ABNORMAL HIGH (ref 0.00–0.15)
Total Protein, Urine: 321 mg/dL

## 2018-12-18 MED ORDER — SODIUM CHLORIDE 0.9 % IV SOLN
612.5000 mg | INTRAVENOUS | Status: DC
  Administered 2018-12-18: 612.5 mg via INTRAVENOUS
  Filled 2018-12-18: qty 612.5

## 2018-12-23 LAB — BK QUANT PCR (PLASMA/SERUM)
BK Quantitaion PCR: NEGATIVE copies/mL
Log10 BK Qn PCR: UNDETERMINED log10copy/mL

## 2018-12-25 LAB — CMV DNA BY PCR, QUALITATIVE: CMV DNA, Qual PCR: NEGATIVE

## 2019-01-02 ENCOUNTER — Other Ambulatory Visit: Payer: Self-pay | Admitting: Endocrinology

## 2019-01-02 ENCOUNTER — Telehealth: Payer: Self-pay | Admitting: Endocrinology

## 2019-01-02 MED ORDER — INSULIN DEGLUDEC 100 UNIT/ML ~~LOC~~ SOPN: 38.0000 [IU] | PEN_INJECTOR | Freq: Every day | SUBCUTANEOUS | 0 refills | Status: DC

## 2019-01-02 NOTE — Telephone Encounter (Signed)
Patient has called and stated that he would like do a [facility] day supply with [facility].

## 2019-01-02 NOTE — Telephone Encounter (Signed)
Robert [name] has been changed to 90-day supply but would prefer that he instead use  the insulin pump as previously discussed.  Also needs to make a follow-up appointment at least in the next 4-6 weeks or so since he does not have any

## 2019-01-02 NOTE — Telephone Encounter (Signed)
Patient is requesting a call back to discuss his Robert [name] refill and his Dexcom   Please advise

## 2019-01-02 NOTE — Telephone Encounter (Signed)
Pt called and stated that he has confirmed with his pharmacy that this office has received his fax refill request, and he is demanding that the refill be sent immediately, despite being informed that the staff are on lunch, and pt refused to wait. Could you please refill? Pt stated that he is now taking 38 units daily.

## 2019-01-06 ENCOUNTER — Telehealth: Payer: Self-pay | Admitting: Endocrinology

## 2019-01-06 DIAGNOSIS — I120 Hypertensive chronic kidney disease with stage 5 chronic kidney disease or end stage renal disease: Secondary | ICD-10-CM | POA: Diagnosis not present

## 2019-01-06 DIAGNOSIS — Z940 Kidney transplant status: Secondary | ICD-10-CM | POA: Diagnosis not present

## 2019-01-06 DIAGNOSIS — E7800 Pure hypercholesterolemia, unspecified: Secondary | ICD-10-CM | POA: Diagnosis not present

## 2019-01-06 DIAGNOSIS — G4733 Obstructive sleep apnea (adult) (pediatric): Secondary | ICD-10-CM | POA: Diagnosis not present

## 2019-01-06 DIAGNOSIS — Z794 Long term (current) use of insulin: Secondary | ICD-10-CM | POA: Diagnosis not present

## 2019-01-06 DIAGNOSIS — D8989 Other specified disorders involving the immune mechanism, not elsewhere classified: Secondary | ICD-10-CM | POA: Diagnosis not present

## 2019-01-06 DIAGNOSIS — Z1331 Encounter for screening for depression: Secondary | ICD-10-CM | POA: Diagnosis not present

## 2019-01-06 DIAGNOSIS — E1129 Type 2 diabetes mellitus with other diabetic kidney complication: Secondary | ICD-10-CM | POA: Diagnosis not present

## 2019-01-06 DIAGNOSIS — G629 Polyneuropathy, unspecified: Secondary | ICD-10-CM | POA: Diagnosis not present

## 2019-01-06 DIAGNOSIS — D631 Anemia in chronic kidney disease: Secondary | ICD-10-CM | POA: Diagnosis not present

## 2019-01-06 NOTE — Telephone Encounter (Signed)
MEDICATION: [place]  PH[facility]ACY:  [facility] [address]  IS THIS A 90 DAY SUPPLY :  Needs 90 day refill not 30 day  IS PATIENT OUT OF MEDICATION:   IF NOT; HOW MUCH IS LEFT:   LAST APPOINTMENT DATE: @3 /27/2020  NEXT APPOINTMENT DATE:@Visit  date not found  DO WE HAVE YOUR PERMISSION TO LEAVE A DETAILED MESSAGE:YES  OTHER COMMENTS: Patient also needs update on Dexcom   **Let patient know to contact pharmacy at the end of the day to make sure medication is ready. **  ** Please notify patient to allow 48-72 hours to process**  **Encourage patient to contact the pharmacy for refills or they can request refills through [facility]**

## 2019-01-06 NOTE — Telephone Encounter (Signed)
Called pt and informed him that this refill was sent on 01/02/2019 for a 90 day supply.

## 2019-01-06 NOTE — Telephone Encounter (Signed)
Pt is not going to be able to get an insulin pump. He has no pharmacy benefits from Medicare of through his employer.

## 2019-01-14 ENCOUNTER — Other Ambulatory Visit: Payer: Self-pay

## 2019-01-14 ENCOUNTER — Telehealth: Payer: Self-pay | Admitting: Endocrinology

## 2019-01-14 MED ORDER — INSULIN DEGLUDEC 100 UNIT/ML ~~LOC~~ SOPN: PEN_INJECTOR | SUBCUTANEOUS | 0 refills | Status: DC

## 2019-01-14 NOTE — Telephone Encounter (Signed)
Called pt and resolved problem

## 2019-01-14 NOTE — Telephone Encounter (Signed)
Patient is calling in regards to [name] and is requesting to speak with Olen Cordial.  Please Advise, Thanks

## 2019-01-15 ENCOUNTER — Ambulatory Visit (HOSPITAL_COMMUNITY)
Admission: RE | Admit: 2019-01-15 | Discharge: 2019-01-15 | Disposition: A | Discharge status: Home / Self Care | Payer: Medicare Other | Source: Ambulatory Visit | Attending: Internal Medicine | Admitting: Internal Medicine

## 2019-01-15 ENCOUNTER — Other Ambulatory Visit: Payer: Self-pay

## 2019-01-15 DIAGNOSIS — Z940 Kidney transplant status: Secondary | ICD-10-CM | POA: Diagnosis not present

## 2019-01-15 LAB — URINALYSIS, COMPLETE (UACMP) WITH MICROSCOPIC
Bacteria, UA: NONE SEEN
Bilirubin Urine: NEGATIVE
Glucose, UA: 50 mg/dL — AB
Ketones, ur: NEGATIVE mg/dL
Leukocytes,Ua: NEGATIVE
Nitrite: NEGATIVE
Protein, ur: 100 mg/dL — AB
Specific Gravity, Urine: 1.019 (ref 1.005–1.030)
pH: 5 (ref 5.0–8.0)

## 2019-01-15 LAB — BASIC METABOLIC PANEL
Anion gap: 10 (ref 5–15)
BUN: 27 mg/dL — ABNORMAL HIGH (ref 6–20)
CO2: 22 mmol/L (ref 22–32)
Calcium: 9.3 mg/dL (ref 8.9–10.3)
Chloride: 105 mmol/L (ref 98–111)
Creatinine, Ser: 1.81 mg/dL — ABNORMAL HIGH (ref 0.61–1.24)
GFR calc Af Amer: 46 mL/min — ABNORMAL LOW (ref >60)
GFR calc non Af Amer: 40 mL/min — ABNORMAL LOW (ref >60)
Glucose, Bld: 250 mg/dL — ABNORMAL HIGH (ref 70–99)
Potassium: 3.9 mmol/L (ref 3.5–5.1)
Sodium: 137 mmol/L (ref 135–145)

## 2019-01-15 LAB — CBC
HCT: 46 % (ref 39.0–52.0)
Hemoglobin: 14.6 g/dL (ref 13.0–17.0)
MCH: 27.4 pg (ref 26.0–34.0)
MCHC: 31.7 g/dL (ref 30.0–36.0)
MCV: 86.5 fL (ref 80.0–100.0)
Platelets: 235 10*3/uL (ref 150–400)
RBC: 5.32 MIL/uL (ref 4.22–5.81)
RDW: 13.8 % (ref 11.5–15.5)
WBC: 6.3 10*3/uL (ref 4.0–10.5)
nRBC: 0 % (ref 0.0–0.2)

## 2019-01-15 LAB — PROTEIN / CREATININE RATIO, URINE
Creatinine, Urine: 113.76 mg/dL
Protein Creatinine Ratio: 1.67 mg/mg{Cre} — ABNORMAL HIGH (ref 0.00–0.15)
Total Protein, Urine: 190 mg/dL

## 2019-01-15 MED ORDER — SODIUM CHLORIDE 0.9 % IV SOLN
5.0000 mg/kg | INTRAVENOUS | Status: DC
  Administered 2019-01-15: 662.5 mg via INTRAVENOUS
  Filled 2019-01-15: qty 662.5

## 2019-01-17 LAB — BK QUANT PCR (PLASMA/SERUM)
BK Quantitaion PCR: NEGATIVE copies/mL
Log10 BK Qn PCR: UNDETERMINED log10copy/mL

## 2019-01-20 LAB — CMV DNA, QUANTITATIVE, PCR
CMV DNA Quant: NEGATIVE IU/mL
Log10 CMV Qn DNA Pl: UNDETERMINED log10 IU/mL

## 2019-01-27 ENCOUNTER — Telehealth: Payer: Self-pay | Admitting: Cardiovascular Disease

## 2019-01-27 ENCOUNTER — Telehealth: Payer: Self-pay | Admitting: Endocrinology

## 2019-01-27 NOTE — Telephone Encounter (Signed)
Called and left detailed voicemail for pt that per our conversation on 01/14/2019, a 90 day supply of this medication was sent to this pharmacy per his request. For this reason, the Rx cannot be sent again. Pt was advised to call pharmacy and see if this medication is ready for pickup, and if not, have the pharmacy call this office in order to resolve any issues there may be.

## 2019-01-27 NOTE — Telephone Encounter (Signed)
Called pt and left another voicemail informing pt that his Robert [name] Rx [facility] [address] per his request during our last phone call as well as the fact that the only other pharmacy [facility] on the chart [facility] [address] [facility] [place], which his chart shows we did not send his prescription to.

## 2019-01-27 NOTE — Telephone Encounter (Signed)
New Message:   Patient calling to speak with a nurse patient would not tell me the nature of this call. Please call patient back.

## 2019-01-27 NOTE — Telephone Encounter (Signed)
MEDICATION: [name]  [facility]AC[facility] [address]  IS THIS A 90 DAY SUPPLY : yes  IS PATIENT OUT OF MEDICATION: almost  IF NOT; HOW MUCH IS LEFT:   LAST APPOINTMENT DATE: @4 /05/2019  NEXT APPOINTMENT DATE:@Visit  date not found  DO WE HAVE YOUR PERMISSION TO LEAVE A DETAILED MESSAGE: yes  OTHER COMMENTS:    **Let patient know to contact pharmacy at the end of the day to make sure medication is ready. **  ** Please notify patient to allow 48-72 hours to process**  **Encourage patient to contact the pharmacy for refills or they can request refills through [facility]**

## 2019-01-27 NOTE — Telephone Encounter (Signed)
Called patient, his visit was switched to a virtual visit- patient had no questions at this time.

## 2019-01-27 NOTE — Telephone Encounter (Signed)
Patient called requesting to speak with Olen Cordial, stated that his RX is being sent to the [facility]. Put patient on hold and then he hung up.

## 2019-01-28 ENCOUNTER — Other Ambulatory Visit: Payer: Self-pay | Admitting: Endocrinology

## 2019-01-28 ENCOUNTER — Telehealth: Payer: Self-pay | Admitting: Cardiovascular Disease

## 2019-01-28 NOTE — Telephone Encounter (Signed)
Virtual Visit Pre-Appointment Phone Call  "(Name), I am calling you today to discuss your upcoming appointment. We are currently trying to limit exposure to the virus that causes COVID-19 by seeing patients at home rather than in the office."  1. "What is the BEST phone number to call the day of the visit?" - include this in appointment notes  2. Do you have or have access to (through a family member/friend) a smartphone with video capability that we can use for your visit?" a. If yes - list this number in appt notes as cell (if different from BEST phone #) and list the appointment type as a VIDEO visit in appointment notes b. If no - list the appointment type as a PHONE visit in appointment notes  3. Confirm consent - "In the setting of the current Covid19 crisis, you are scheduled for a (phone or video) visit with your provider on (date) at (time).  Just as we do with many in-office visits, in order for you to participate in this visit, we must obtain consent.  If you'd like, I can send this to your mychart (if signed up) or email for you to review.  Otherwise, I can obtain your verbal consent now.  All virtual visits are billed to your insurance company just like a normal visit would be.  By agreeing to a virtual visit, we'd like you to understand that the technology does not allow for your provider to perform an examination, and thus may limit your provider's ability to fully assess your condition. If your provider identifies any concerns that need to be evaluated in person, we will make arrangements to do so.  Finally, though the technology is pretty good, we cannot assure that it will always work on either your or our end, and in the setting of a video visit, we may have to convert it to a phone-only visit.  In either situation, we cannot ensure that we have a secure connection.  Are you willing to proceed?" STAFF: Did the patient verbally acknowledge consent to telehealth visit? Document  YES/NO here: Yes  4. Advise patient to be prepared - "Two hours prior to your appointment, go ahead and check your blood pressure, pulse, oxygen saturation, and your weight (if you have the equipment to check those) and write them all down. When your visit starts, your provider will ask you for this information. If you have an Apple Watch or Kardia device, please plan to have heart rate information ready on the day of your appointment. Please have a pen and paper handy nearby the day of the visit as well."  5. Give patient instructions for MyChart download to smartphone OR Doximity/Doxy.me as below if video visit (depending on what platform provider is using)  6. Inform patient they will receive a phone call 15 minutes prior to their appointment time (may be from unknown caller ID) so they should be prepared to answer    TELEPHONE CALL NOTE  Robert [name] has been deemed a candidate for a follow-up tele-health visit to limit community exposure during the Covid-19 pandemic. I spoke with the patient via phone to ensure availability of phone/video source, confirm preferred email & phone number, and discuss instructions and expectations.  I reminded Robert [name] to be prepared with any vital sign and/or heart rhythm information that could potentially be obtained via home monitoring, at the time of his visit. I reminded Robert [name] to expect a phone call prior to  his visit.  Therisa Doyne 01/28/2019 11:38 AM  Pt stated he does have a BP cuff at home but weight scale is broken. He stated his phone does not let unknown calls come through, so staff will have to leave a vm and he will call back.   IF USING DOXIMITY or DOXY.ME - The patient will receive a link just prior to their visit by text.     FULL LENGTH CONSENT FOR TELE-HEALTH VISIT   I hereby voluntarily request, consent and authorize [facility] and its employed or contracted physicians, physician assistants, nurse  practitioners or other licensed health care professionals (the Practitioner), to provide me with telemedicine health care services (the Services") as deemed necessary by the treating Practitioner. I acknowledge and consent to receive the Services by the Practitioner via telemedicine. I understand that the telemedicine visit will involve communicating with the Practitioner through live audiovisual communication technology and the disclosure of certain medical information by electronic transmission. I acknowledge that I have been given the opportunity to request an in-person assessment or other available alternative prior to the telemedicine visit and am voluntarily participating in the telemedicine visit.  I understand that I have the right to withhold or withdraw my consent to the use of telemedicine in the course of my care at any time, without affecting my right to future care or treatment, and that the Practitioner or I may terminate the telemedicine visit at any time. I understand that I have the right to inspect all information obtained and/or recorded in the course of the telemedicine visit and may receive copies of available information for a reasonable fee.  I understand that some of the potential risks of receiving the Services via telemedicine include:   Delay or interruption in medical evaluation due to technological equipment failure or disruption;  Information transmitted may not be sufficient (e.g. poor resolution of images) to allow for appropriate medical decision making by the Practitioner; and/or   In rare instances, security protocols could fail, causing a breach of personal health information.  Furthermore, I acknowledge that it is my responsibility to provide information about my medical history, conditions and care that is complete and accurate to the best of my ability. I acknowledge that Practitioner's advice, recommendations, and/or decision may be based on factors not within  their control, such as incomplete or inaccurate data provided by me or distortions of diagnostic images or specimens that may result from electronic transmissions. I understand that the practice of medicine is not an exact science and that Practitioner makes no warranties or guarantees regarding treatment outcomes. I acknowledge that I will receive a copy of this consent concurrently upon execution via email to the email address I last provided but may also request a printed copy by calling the office of [facility].    I understand that my insurance will be billed for this visit.   I have read or had this consent read to me.  I understand the contents of this consent, which adequately explains the benefits and risks of the Services being provided via telemedicine.   I have been provided ample opportunity to ask questions regarding this consent and the Services and have had my questions answered to my satisfaction.  I give my informed consent for the services to be provided through the use of telemedicine in my medical care  By participating in this telemedicine visit I agree to the above.

## 2019-01-28 NOTE — Telephone Encounter (Signed)
LVM/Called x3 for pre reg

## 2019-01-28 NOTE — Telephone Encounter (Signed)
LVM for pre reg °

## 2019-01-29 ENCOUNTER — Telehealth (INDEPENDENT_AMBULATORY_CARE_PROVIDER_SITE_OTHER): Payer: Medicare Other | Admitting: Cardiovascular Disease

## 2019-01-29 ENCOUNTER — Encounter: Payer: Self-pay | Admitting: Cardiovascular Disease

## 2019-01-29 DIAGNOSIS — Z9989 Dependence on other enabling machines and devices: Secondary | ICD-10-CM | POA: Diagnosis not present

## 2019-01-29 DIAGNOSIS — E039 Hypothyroidism, unspecified: Secondary | ICD-10-CM

## 2019-01-29 DIAGNOSIS — E118 Type 2 diabetes mellitus with unspecified complications: Secondary | ICD-10-CM

## 2019-01-29 DIAGNOSIS — E785 Hyperlipidemia, unspecified: Secondary | ICD-10-CM

## 2019-01-29 DIAGNOSIS — G4733 Obstructive sleep apnea (adult) (pediatric): Secondary | ICD-10-CM

## 2019-01-29 DIAGNOSIS — Z940 Kidney transplant status: Secondary | ICD-10-CM | POA: Diagnosis not present

## 2019-01-29 DIAGNOSIS — Z8679 Personal history of other diseases of the circulatory system: Secondary | ICD-10-CM

## 2019-01-29 DIAGNOSIS — I10 Essential (primary) hypertension: Secondary | ICD-10-CM

## 2019-01-29 DIAGNOSIS — Z794 Long term (current) use of insulin: Secondary | ICD-10-CM

## 2019-01-29 DIAGNOSIS — Z9889 Other specified postprocedural states: Secondary | ICD-10-CM

## 2019-01-29 NOTE — Progress Notes (Signed)
Virtual Visit via Video Note   This visit type was conducted due to national recommendations for restrictions regarding the COVID-19 Pandemic (e.g. social distancing) in an effort to limit this patient's exposure and mitigate transmission in our community.  Due to his co-morbid illnesses, this patient is at least at moderate risk for complications without adequate follow up.  This format is felt to be most appropriate for this patient at this time.  All issues noted in this document were discussed and addressed.  A limited physical exam was performed with this format.  Please refer to the patient's chart for his consent to telehealth for [facility].   Evaluation Performed:  Follow-up visit  Date:  01/29/2019   ID:  Robert [name], DOB 1961/03/22, MRN [id number]  Patient Location: Home Provider Location: Home  PCP:  [name], [name], MD  Cardiologist:  Shelva Majestic, MD Electrophysiologist:  None   Chief Complaint: 72-monthfollow-up cardiology evaluation  History of Present Illness:    ADEQUAN KINDREDis a 58y.o. male who  has a complex medical history that includes a congenital nonfunctioning left kidney for which he underwent left nephrectomy at age 58 He has a history of obesity, diabetes mellitus, and has been demonstrated to have proteinuria of his remaining single kidney, [place] This revealed focal segmental glomerular sclerosis. At that time he was [facility]in [facility] He sees Dr. GMoshe Ciproin GKenmarefor his kidney insufficiency and has been on ARB therapy in the past in an attempt to reduce proteinuria. He sees Dr. KDwyane Deefor his diabetes mellitus. He also has a history of hypothyroidism, hyperlipidemia, anxiety and depression. He has a history of prior supraventricular tachycardia requiring hospitalization in 2007 as was felt to be  possibly AV nodal reentrant rhythm.  He has a history of complex sleep apnea initially diagnosed in 2007 with an AHI at that time of 56.7 per hour and during REM sleep this increased to 65.7 per hour. At that time he dropped his oxygen 84% and had loud snoring. He had been on BiPAP therapy. He underwent a 5 year followup split-night study in 2012 which again confirmed severe sleep apnea with an AHI of 60.7 on the baseline portion of the study. He was unable to achieve REM sleep at that time. Oxygen saturation during non-REM sleep at 83%. He was titrated utilizing BiPAP up to a setting of 16/12 cm water pressure. He does admit to continued residual daytime sleepiness despite using his BiPAP therapy.  An echo Doppler study in August 2012 showed mild concentric LVH with normal systolic function and grade 1 diastolic dysfunction. He had trace mitral and tricuspid insufficiency as well as pulmonic insufficiency. A nuclear perfusion study in August 2012 showed normal perfusion.  Mr. MBouillonwas on dialysis Monday, [name].  [facility]  [facility].  I do not have the results of this evaluation.  He has had some difficulty with a steal syndrome in his right hand since he had AV fistulas inserted in the forearm and most recently now in the upper arm.  He continues to use BiPAP with 100% compliance.  He had a ResMed [employer]ill [employer]unit.  He obtains his CPAP supplies online because they are cheaper, but  had seen [place] patient.  He is using a Art therapist full face mask.  A download in the office from 05/28/2016 through 07/26/2016.  He is meeting compliance standards with usage at 85% usage stays greater than 4 hours at 77%.  He is averaging 6 hours and 43 minutes per night.  He is set at an IPAP pressure of 16 and EPAP pressure of 12.  His AHI was mildly increased at 6.5 with 5.2 per hour of apneic  events and 1.3 of hypopnea events.  When I saw him at that time in 2017 he had stopped taken Bystolic since his blood pressure had become low at the end of dialysis.  He had been on 5 mg on nondialysis days.  He tells me his aspirin was discontinued because he was continuously having issues with bleeding following his dialysis when the catheter was removed.   He had an episode of SVT in December 2017, which was successfully aborted with vagal maneuvers.  In the emergency room, he had 2-3 more recurrences of SVT, of which, again, all aborted with vagal maneuvers.  He was started on verapamil and discharge from the hospital.  He has seen Dr. Curt Bears and had an EP study with attempted ablation, but apparently he was not able to be induced and ablation was not performed.   He denies any recurrent episodes of SVT.  Remotely, he may of had some mild nonexertional musculoskeletal discomfort but has not experienced any of this recently.  Reportedly, his remote stress echo was negative for ischemia, although mild ST changes were noted in recovery.  He continues to be asymptomatic.  He underwent a kidney transplant from a living donor on 02/25/2017 at [facility]  He tolerated surgery well but has developed a postoperative inguinal hernia, which will ultimately need to be surgically repaired.  He received a new BiPAP machine from [facility] 2 weeks ago.  He has a full face mask, but admits to increased leak.    Since I saw him in September 2018, he had 2 hospital stays at [facility].  He developed intestinal bleeding due to diverticular disease and also had some dehydration issues.  He also underwent inguinal hernia surgery.  He denies any recent chest pain or palpitations.  He continues to use BiPAP with 100% compliance.  However, he did not have his machine with him for the days he was in the hospital.  He brought his machine with him today for interrogation.  Over the past month, usage hours and 6 hours and  32 minutes per night with an HI of 0.9.  He used to machine 27 of 30 nights nights that were not used was because he was in the hospital.  Usage greater than 4 hours was 26 of 30.  He is on a BiPAP pressure of 18/13 0.9.  Recently he has noticed a mild leak.  AHI was 5.6 over the past 7 days.  He has a ResMed fullface mask, Mirage Quatro mask.    Since I last saw him in November 2018, he has felt well.  He is seeing Dr. Azzie Glatter at [facility] from a nephrology standpoint.  He denies any episodes of chest pain or awareness of recurrent arrhythmia.  He admits to 100% use with CPAP.  [name] is his DME company.  He has a ResMed air 10 V auto unit with an EPAP pressure 14.  An IPAP pressure set at 18.  A download from 01/14/2017 through 02/12/2018 shows 100%  compliance.  He is averaging 8 hours and 7 minutes of sleep.  AHI is excellent at 2.6.     I last saw him in May 2019.  Since that time, he has continued to [facility] at [facility]  Currently over this year his blood pressure had increased and his lisinopril dose was increased to 20 mg and amlodipine reduced to 5 mg.  His weight had fluctuated and has increased from 293 to 303.  He has continued to use BiPAP with 100% compliance.  I was able to obtain a download today from March 23 through January 27, 2019.  He is 100% compliant with average BiPAP use of 7 hours and 10 minutes.  At his 18/14 pressure, AHI remains excellent at 2.6/h.  Over the past 6 months he has changed jobs twice.  He is been staying home particularly with his significant comorbidities which can be affected by the coronavirus including hypertension, diabetes mellitus, obesity, renal disease, and immunosuppression.  He walks approximately 2 times a day for short duration.  He receives immunosuppressant infusion with [employer] and is scheduled for his next infusion: On Feb 12, 2019.  His last lipid panel was in October 2019 with total cholesterol 137 and LDL cholesterol 73.  He is scheduled to see his  nephrologist in August.  He presents for evaluation   The patient does not have symptoms concerning for COVID-19 infection (fever, chills, cough, or new shortness of breath).    Past Medical History:  Diagnosis Date   Anemia    low hgb at present   Arthritis    FEET   Blood transfusion without reported diagnosis 1974   kidney removed - L   Cancer ([place])    skin ca right shoulder, plastic dsyplasia, pre-Ca polpys removed on Colonoscopy- 07/2014   Colon polyps 07/23/2014   Tubular adenomas x 5   Constipation    Diabetes mellitus without complication ([place])    Type 2   Difficult intubation    unsure of actual problem but it was during the January 28, 2015 procedure.   Dysrhythmia    Eczema    ESRD (end stage renal disease) (HCC)    hEMO mwf   Headache(784.0)    slight dull h/a due kidney failure   Hyperlipidemia    Hypertension    SVT h/o , followed by Dr. Claiborne Billings   Hypothyroidism    Macular degeneration    Neuropathy    right hand and both feet   PAT (paroxysmal atrial tachycardia) (HCC)    PSVT (paroxysmal supraventricular tachycardia) (HCC)    SBO (small bowel obstruction) (HCC)    Shortness of breath    with exertion   Sleep apnea 06/18/11   split-night sleep study- [facility]., BiPAP- 13-16   Thyroid disease    Umbilical hernia    will repair with thi surgery   Past Surgical History:  Procedure Laterality Date   AV FISTULA PLACEMENT Right 02/25/2015   Procedure: RIGHT ARTERIOVENOUS (AV) FISTULA CREATION;  Surgeon: Serafina Mitchell, MD;  Location: [place];  Service: Vascular;  Laterality: Right;   AV FISTULA PLACEMENT Right 09/15/2015   Procedure: ARTERIOVENOUS (AV) FISTULA CREATION- RIGHT ARM;  Surgeon: Serafina Mitchell, MD;  Location: [place];  Service: Vascular;  Laterality: Right;   CAPD INSERTION N/A 05/18/2014   Procedure: LAPAROSCOPIC INSERTION CONTINUOUS AMBULATORY PERITONEAL DIALYSIS  (CAPD) CATHETER;  Surgeon: Ralene Ok, MD;  Location: [place];  Service: General;  Laterality: N/A;  COLONOSCOPY     COLONOSCOPY WITH PROPOFOL N/A 07/12/2016   Procedure: COLONOSCOPY WITH PROPOFOL;  Surgeon: [name], MD;  [facility];  Service: Endoscopy;  Laterality: N/A;   declotting of fistula  08/2015   ESOPHAGOGASTRODUODENOSCOPY (EGD) WITH PROPOFOL N/A 07/12/2016   Procedure: ESOPHAGOGASTRODUODENOSCOPY (EGD) WITH PROPOFOL;  Surgeon: [name], MD;  [facility];  Service: Endoscopy;  Laterality: N/A;   FISTULA SUPERFICIALIZATION Right 02/07/2016   Procedure: RIIGHT UPPER ARM FISTULA SUPERFICIALIZATION;  Surgeon: Elam Dutch, MD;  Location: [place];  Service: Vascular;  Laterality: Right;   IJ catheter insertion     INSERTION OF DIALYSIS CATHETER Right 02/25/2015   Procedure: INSERTION OF RIGHT INTERNAL JUGULAR DIALYSIS CATHETER;  Surgeon: Serafina Mitchell, MD;  Location: [place] [facility];  Service: Vascular;  Laterality: Right;   KIDNEY TRANSPLANT     LAPAROSCOPIC REPOSITIONING CAPD CATHETER N/A 06/16/2014   Procedure: LAPAROSCOPIC REPOSITIONING CAPD CATHETER;  Surgeon: Ralene Ok, MD;  Location: [place];  Service: General;  Laterality: N/A;   LAPAROSCOPY N/A 05/04/2015   Procedure: LAPAROSCOPY DIAGNOSTIC LYSIS OF ADHESIONS;  Surgeon: Coralie Keens, MD;  Location: [place];  Service: General;  Laterality: N/A;   MINOR REMOVAL OF PERITONEAL DIALYSIS CATHETER N/A 04/28/2015   Procedure:  REMOVAL OF PERITONEAL DIALYSIS CATHETER;  Surgeon: Coralie Keens, MD;  Location: [place];  Service: General;  Laterality: N/A;   NEPHRECTOMY Left 1974   RENAL BIOPSY Right 2012   REVISON OF ARTERIOVENOUS FISTULA Right 05/26/2015   Procedure: SUPERFICIALIZATION OF ARTERIOVENOUS FISTULA WITH SIDE BRANCH LIGATIONS;  Surgeon: Serafina Mitchell, MD;  Location: [place];  Service: Vascular;  Laterality: Right;   SKIN CANCER EXCISION     right shoulder   SVT ABLATION N/A 11/23/2016   Procedure: SVT Ablation;   Surgeon: Will Meredith Leeds, MD;  Location: [place] CV LAB;  Service: Cardiovascular;  Laterality: N/A;   UMBILICAL HERNIA REPAIR N/A 05/18/2014   Procedure: HERNIA REPAIR UMBILICAL ADULT;  Surgeon: Ralene Ok, MD;  Location: [place];  Service: General;  Laterality: N/A;   UMBILICAL HERNIA REPAIR N/A 04/28/2015   Procedure: UMBILICAL HERNIA REPAIR WITH MESH;  Surgeon: Coralie Keens, MD;  Location: [place];  Service: General;  Laterality: N/A;     Current Meds  Medication Sig   ACCU-CHEK FASTCLIX LANCETS MISC Use lancets to check blood sugar 5 times daily.   amLODipine (NORVASC) 5 MG tablet Take 5 mg by mouth daily.    aspirin EC 81 MG tablet Take 81 mg by mouth daily.   BD INSULIN SYRINGE U/F 31G X 5/16" 1 ML MISC USE 3 TIMES A DAY WITH HUMULIN R   belatacept (NULOJIX) 250 MG SOLR injection Inject 24.5 mLs into the vein every 28 (twenty-eight) days.    Blood Glucose Monitoring Suppl (ACCU-CHEK GUIDE) w/Device KIT 1 each by Does not apply route daily. Use meter to check blood sugar 5 times daily.   Blood Glucose Monitoring Suppl (FIFTY50 GLUCOSE METER 2.0) w/Device KIT Use as directed   Continuous Blood Gluc Receiver ([place]) Ray 1 each by Does not apply route See admin instructions. Use for monitoring of blood glucose.   Continuous Blood Gluc Sensor (DEXCOM G6 SENSOR) MISC 1 each by Does not apply route See admin instructions. Apply one sensor to body once every 10 days for continuous glucose monitoring.   Continuous Blood Gluc Transmit (DEXCOM G6 TRANSMITTER) MISC 1 each by Does not apply route See admin instructions. Use 1 transmitter once  every 90 days in conjunction with Dexcom G6 sensor.   DULoxetine (CYMBALTA) 30 MG capsule Take 30 mg by mouth daily.    fluocinonide cream (LIDEX) 8.18 % Apply 1 application topically daily as needed (Skin inflammation (hand, thigh, back)).    furosemide (LASIX) 20 MG tablet Take 40 mg by mouth daily as needed for edema.      gabapentin (NEURONTIN) 100 MG capsule Take 200-300 mg by mouth See admin instructions. 200 mg in the morning and 300 mg at bedtime   glucose blood (ACCU-CHEK GUIDE) test strip 1 each by Other route as needed for other. Use as instructed to check blood sugar 5 times daily.   HUMULIN R 500 UNIT/ML injection Using a U-100 1cc/31G/5/16" INSULIN SYRINGE, draw up and inject 5-18 units into the skin three times a day 30 minutes prior to each meal   insulin degludec (TRESIBA FLEXTOUCH) 100 UNIT/ML SOPN FlexTouch Pen Inject 38 units under the skin once daily.   Insulin Disposable Pump (OMNIPOD DASH 5 PACK) MISC 1 each by Does not apply route every 3 (three) days. Apply 1 omnipod dash pump to body every 3 days for insulin delivery.   Insulin Disposable Pump (OMNIPOD DASH SYSTEM) KIT 1 each by Does not apply route every 3 (three) days. USE SYSTEM IN CONJUNCTION WITH OMNIPOD PUMP FOR INSULIN DELIVERY EVERY 3 DAYS.   Insulin Pen Needle (B-D UF III MINI PEN NEEDLES) 31G X 5 MM MISC USE 4 PER DAY TO INJECT INSULIN.   Insulin Syringe-Needle U-100 (BD INSULIN SYRINGE ULTRAFINE) 31G X 5/16" 0.3 ML MISC Use to inject insulin 2-3 times per day   lactulose (CHRONULAC) 10 GM/15ML solution Take 10 g by mouth daily as needed.    Lancets Misc. (ACCU-CHEK FASTCLIX LANCET) KIT 1 each by Does not apply route daily.   levothyroxine (SYNTHROID, LEVOTHROID) 100 MCG tablet TAKE 1 TABLET BY MOUTH EVERY DAY   liraglutide (VICTOZA) 18 MG/3ML SOPN Inject 0.2 mLs (1.2 mg total) into the skin daily.   lisinopril (PRINIVIL,ZESTRIL) 10 MG tablet Take 20 mg by mouth daily.    metoprolol tartrate (LOPRESSOR) 50 MG tablet TAKE 1 TABLET BY MOUTH TWICE A DAY   mycophenolate (CELLCEPT) 250 MG capsule Take 1,000 mg by mouth every 12 (twelve) hours.    predniSONE (DELTASONE) 5 MG tablet Take 5 mg by mouth daily with breakfast.   rosuvastatin (CRESTOR) 10 MG tablet TAKE 1 TABLET BY MOUTH EVERY DAY   senna-docusate (SENOKOT-S)  8.6-50 MG tablet Take 1 tablet by mouth as needed for mild constipation.   tamsulosin (FLOMAX) 0.4 MG CAPS capsule Take 0.8 mg by mouth at bedtime.    Testosterone (ANDROGEL PUMP) 20.25 MG/ACT (1.62%) GEL Apply 3 pumps on one arm and 2 on the other every morning on shoulder area   traMADol (ULTRAM) 50 MG tablet Take 1 tablet (50 mg total) by mouth every 6 (six) hours as needed. (Patient taking differently: Take 100 mg by mouth every 6 (six) hours as needed (for pain). )   traZODone (DESYREL) 50 MG tablet Take 50-100 mg by mouth at bedtime as needed for sleep.    UNABLE TO FIND BiPAP: At bedtime   zolpidem (AMBIEN) 10 MG tablet Take 10-20 mg by mouth at bedtime as needed for sleep.      Allergies:   Patient has no known allergies.   Social History   Tobacco Use   Smoking status: Former Smoker    Types: Cigars    Last attempt to quit:  12/06/2012    Years since quitting: 6.1   Smokeless tobacco: Never Used  Substance Use Topics   Alcohol use: Yes    Comment: once/month   Drug use: No     Family Hx: The patient's family history includes Colon polyps in his father. There is no history of Colon cancer or Stomach cancer.  ROS:   Please see the history of present illness.    Positive for weight gain up to 303, BMI consistent with morbid obesity. He denies any dyspnea. He denies any chest pain or shortness of breath He denies any palpitations or recurrent SVT He has noticed his blood pressure at times slightly increasing He denies abdominal pain. He denies significant swelling He is sleeping well with his current BiPAP at 18/14.  He is unaware of breakthrough snoring, bruxism, and he denies any significant daytime sleepiness All other systems reviewed and are negative.   Prior CV studies:   The following studies were reviewed today:  I was able to obtain a download of his BiPAP from March 23 through January 27, 2019: 100% compliance, BiPAP use 7 hours 10 minutes on average,  at 18/14 pressure, AHI 2.6. I reviewed his records since his last evaluation with me in May 2019  Labs/Other Tests and Data Reviewed:    EKG:  An ECG dated 02/14/2018 was personally reviewed today and demonstrated:  Normal sinus rhythm at 64 bpm with normal intervals.  No ectopy.  This was unchanged from his prior ECG of November 2018.  Recent Labs: 11/12/2018: ALT 20; TSH 0.74 01/15/2019: BUN 27; Creatinine, Ser 1.81; Hemoglobin 14.6; Platelets 235; Potassium 3.9; Sodium 137   Recent Lipid Panel Lab Results  Component Value Date/Time   CHOL 166 07/10/2018 08:33 AM   TRIG 86.0 07/10/2018 08:33 AM   HDL 54.20 07/10/2018 08:33 AM   CHOLHDL 3 07/10/2018 08:33 AM   LDLCALC 95 07/10/2018 08:33 AM    Wt Readings from Last 3 Encounters:  01/15/19 293 lb 3.4 oz (133 kg)  12/18/18 298 lb 1 oz (135.2 kg)  11/20/18 (!) 302 lb 2 oz (137 kg)     Objective:    Vital Signs:  BP 140/90    Pulse 67    Temp (!) 97.3 F (36.3 C)    Ht 5' 11" (1.803 m)    BMI 40.89 kg/m    He states his weight has increased from 293 up to 303.  He is well-developed and well-nourished, morbidly obese. Respirations are normal and unlabored HEENT is unremarkable. He has a thick neck. There is no audible wheezing His pulse and heart rate are regular by his palpation. There is no chest wall tenderness with palpation He is unaware of any abdominal discomfort with palpation or tenderness There is no significant swelling Neurologically he appears intact He has normal cognition and affect  ASSESSMENT & PLAN:    1. Essential hypertension: Blood pressure today remains slightly elevated at 140/90 despite taking amlodipine 5 mg, lisinopril 20 mg, and metoprolol 50 mg twice a day.  I again discussed with him the new hypertensive guidelines.  Particularly with his renal transplantation I have recommended further titration of amlodipine up to 7.5 mg to see if he can consistently get his blood pressure less than 130/80 if  possible.  He also has follow-up with nephrology in several months. 2. History of SVT, status post ablation: No recurrent arrhythmia.  His pulse is regular.  He denies any tachypalpitations. 3. Complex sleep apnea: Currently continues to  use BiPAP with 100% compliance.  I obtained and reviewed a download which shows daily usage averaging 7 hours and 10 minutes.  AHI is 2.6 with a BiPAP pressure of 18/14.  He is sleeping well with treatment.  He has a full facemask, [employer] which is doing well.  There is no significant leak 4. Hyperlipidemia with target LDL less than 70: He continues to be on rosuvastatin. 5. History of renal transplantation: Followed at [facility], continues to be on immunosuppressive therapy with next infusion on Feb 12, 2019 6. Type 2 diabetes mellitus with complication on long-term insulin: Continues to be on insulin. 7. Morbid obesity: We discussed the importance of increased exercise and weight loss.  He has been walking 2 times per day.  We discussed ideally 150 minutes/week.  He admits to recent weight gain up to 303 pounds. 8. Hypothyroidism: On levothyroxine  COVID-19 Education: The signs and symptoms of COVID-19 were discussed with the patient and how to seek care for testing (follow up with PCP or arrange E-visit).  The importance of social distancing was discussed today.  Time:   Today, I have spent 25 minutes with the patient with telehealth technology discussing the above problems.     Medication Adjustments/Labs and Tests Ordered: Current medicines are reviewed at length with the patient today.  Concerns regarding medicines are outlined above.   Tests Ordered: No orders of the defined types were placed in this encounter.   Medication Changes: No orders of the defined types were placed in this encounter.   Disposition:  Follow up: I have recommended repeat laboratory be checked when he goes for his immunosuppressant infusion on May 7 at that time we will  obtain a comprehensive metabolic panel, CBC, lipid panel, TSH, and hemoglobin A1c.  He will be following up at [facility] in 3 to 4 months.  I will see him in 6 months for reevaluation.  Signed, Shelva Majestic, MD  01/29/2019 8:41 AM    [place]

## 2019-01-29 NOTE — Telephone Encounter (Signed)
Please refill if appropriate

## 2019-01-29 NOTE — Patient Instructions (Addendum)
Medication Instructions:  The current medical regimen is effective;  continue present plan and medications.  If you need a refill on your cardiac medications before your next appointment, please call your pharmacy.   Lab work: Fasting lab work (CBC, CMET, TSH, LIPID, A1C) when you go to [facility] on May 7th.  If you have labs (blood work) drawn today and your tests are completely normal, you will receive your results only by: Marland Kitchen MyChart Message (if you have MyChart) OR . A paper copy in the mail If you have any lab test that is abnormal or we need to change your treatment, we will call you to review the results.  Follow-Up: At [facility], you and your health needs are our priority.  As part of our continuing mission to provide you with exceptional heart care, we have created designated Provider Care Teams.  These Care Teams include your primary Cardiologist (physician) and Advanced Practice Providers (APPs -  Physician Assistants and Nurse Practitioners) who all work together to provide you with the care you need, when you need it. You will need a follow up appointment in 6 months.  Please call our office 2 months in advance to schedule this appointment.  You may see Dr.Kelly or one of the following Advanced Practice Providers on your designated Care Team: Almyra Deforest, [place] . Fabian Sharp, PA-C

## 2019-01-31 ENCOUNTER — Other Ambulatory Visit: Payer: Self-pay | Admitting: Endocrinology

## 2019-02-12 ENCOUNTER — Ambulatory Visit (HOSPITAL_COMMUNITY)
Admission: RE | Admit: 2019-02-12 | Discharge: 2019-02-12 | Disposition: A | Discharge status: Home / Self Care | Payer: Medicare Other | Source: Ambulatory Visit | Attending: Internal Medicine | Admitting: Internal Medicine

## 2019-02-12 ENCOUNTER — Other Ambulatory Visit: Payer: Self-pay

## 2019-02-12 DIAGNOSIS — Z940 Kidney transplant status: Secondary | ICD-10-CM | POA: Diagnosis not present

## 2019-02-12 LAB — BASIC METABOLIC PANEL
Anion gap: 14 (ref 5–15)
BUN: 23 mg/dL — ABNORMAL HIGH (ref 6–20)
CO2: 20 mmol/L — ABNORMAL LOW (ref 22–32)
Calcium: 9.7 mg/dL (ref 8.9–10.3)
Chloride: 107 mmol/L (ref 98–111)
Creatinine, Ser: 1.66 mg/dL — ABNORMAL HIGH (ref 0.61–1.24)
GFR calc Af Amer: 52 mL/min — ABNORMAL LOW (ref >60)
GFR calc non Af Amer: 44 mL/min — ABNORMAL LOW (ref >60)
Glucose, Bld: 177 mg/dL — ABNORMAL HIGH (ref 70–99)
Potassium: 3.9 mmol/L (ref 3.5–5.1)
Sodium: 141 mmol/L (ref 135–145)

## 2019-02-12 LAB — URINALYSIS, COMPLETE (UACMP) WITH MICROSCOPIC
Bacteria, UA: NONE SEEN
Bilirubin Urine: NEGATIVE
Glucose, UA: 150 mg/dL — AB
Ketones, ur: NEGATIVE mg/dL
Leukocytes,Ua: NEGATIVE
Nitrite: NEGATIVE
Protein, ur: 100 mg/dL — AB
Specific Gravity, Urine: 1.023 (ref 1.005–1.030)
pH: 5 (ref 5.0–8.0)

## 2019-02-12 LAB — CBC
HCT: 45.8 % (ref 39.0–52.0)
Hemoglobin: 14.8 g/dL (ref 13.0–17.0)
MCH: 28 pg (ref 26.0–34.0)
MCHC: 32.3 g/dL (ref 30.0–36.0)
MCV: 86.6 fL (ref 80.0–100.0)
Platelets: 223 K/uL (ref 150–400)
RBC: 5.29 MIL/uL (ref 4.22–5.81)
RDW: 13.8 % (ref 11.5–15.5)
WBC: 6.8 K/uL (ref 4.0–10.5)
nRBC: 0 % (ref 0.0–0.2)

## 2019-02-12 LAB — PROTEIN / CREATININE RATIO, URINE
Creatinine, Urine: 135.15 mg/dL
Protein Creatinine Ratio: 2.41 mg/mg{Cre} — ABNORMAL HIGH (ref 0.00–0.15)
Total Protein, Urine: 326 mg/dL

## 2019-02-12 MED ORDER — SODIUM CHLORIDE 0.9 % IV SOLN
5.0000 mg/kg | INTRAVENOUS | Status: DC
  Administered 2019-02-12: 662.5 mg via INTRAVENOUS
  Filled 2019-02-12 (×2): qty 662.5

## 2019-02-14 LAB — CMV DNA, QUANTITATIVE, PCR
CMV DNA Quant: NEGATIVE IU/mL
Log10 CMV Qn DNA Pl: UNDETERMINED log10 IU/mL

## 2019-02-14 LAB — BK QUANT PCR (PLASMA/SERUM)
BK Quantitaion PCR: NEGATIVE copies/mL
Log10 BK Qn PCR: UNDETERMINED log10copy/mL

## 2019-02-25 ENCOUNTER — Other Ambulatory Visit: Payer: Self-pay

## 2019-02-25 ENCOUNTER — Encounter: Payer: Self-pay | Admitting: Podiatry

## 2019-02-25 ENCOUNTER — Ambulatory Visit (INDEPENDENT_AMBULATORY_CARE_PROVIDER_SITE_OTHER): Payer: Medicare Other | Admitting: Podiatry

## 2019-02-25 ENCOUNTER — Other Ambulatory Visit: Payer: Self-pay | Admitting: Endocrinology

## 2019-02-25 VITALS — Temp 97.3°F

## 2019-02-25 DIAGNOSIS — B351 Tinea unguium: Secondary | ICD-10-CM | POA: Diagnosis not present

## 2019-02-25 DIAGNOSIS — E1149 Type 2 diabetes mellitus with other diabetic neurological complication: Secondary | ICD-10-CM

## 2019-02-25 DIAGNOSIS — M79674 Pain in right toe(s): Secondary | ICD-10-CM | POA: Diagnosis not present

## 2019-02-25 DIAGNOSIS — M79675 Pain in left toe(s): Secondary | ICD-10-CM

## 2019-02-25 NOTE — Progress Notes (Signed)
Patient ID: Robert [name], male   DOB: [birth year], 58 y.o.   MRN: [id number] Complaint:  Visit Type: Patient returns to my office for continued preventative foot care services. Complaint: Patient states" my nails have grown long and thick and become painful to walk and wear shoes" Patient has been diagnosed with DM and had a kidney transplant right side.  This is leading to increased leg swelling left.  . The patient presents for preventative foot care services. No changes to ROS  Podiatric Exam: Vascular: dorsalis pedis and posterior tibial pulses are palpable bilateral. Capillary return is immediate. Temperature gradient is WNL. Skin turgor WNL  Sensorium: Diminished  Semmes Weinstein monofilament test. Normal tactile sensation bilaterally. Nail Exam: Pt has thick disfigured discolored nails with subungual debris noted bilateral entire nail hallux through fifth toenails Ulcer Exam: There is no evidence of ulcer or pre-ulcerative changes or infection. Orthopedic Exam: Muscle tone and strength are WNL. No limitations in general ROM. No crepitus or effusions noted. Foot type and digits show no abnormalities. Bony prominences are unremarkable. HAV  B/L Skin: No Porokeratosis. No infection or ulcers  Diagnosis:  Onychomycosis, , Pain in right toe, pain in left toes, Diabetic neuropathy  Hallux limitus 1st MPJ  B/L  Treatment & Plan Procedures and Treatment: Consent by patient was obtained for treatment procedures. The patient understood the discussion of treatment and procedures well. All questions were answered thoroughly reviewed. Debridement of mycotic and hypertrophic toenails, 1 through 5 bilateral and clearing of subungual debris. No ulceration, no infection noted.  Return Visit-Office Procedure: Patient instructed to return to the office for a follow up visit 3 months for continued evaluation and treatment.    Gardiner Barefoot DPM

## 2019-03-12 ENCOUNTER — Ambulatory Visit (HOSPITAL_COMMUNITY)
Admission: RE | Admit: 2019-03-12 | Discharge: 2019-03-12 | Disposition: A | Discharge status: Home / Self Care | Payer: Medicare Other | Source: Ambulatory Visit | Attending: Internal Medicine | Admitting: Internal Medicine

## 2019-03-12 ENCOUNTER — Other Ambulatory Visit: Payer: Self-pay

## 2019-03-12 DIAGNOSIS — Z940 Kidney transplant status: Secondary | ICD-10-CM | POA: Insufficient documentation

## 2019-03-12 LAB — URINALYSIS, COMPLETE (UACMP) WITH MICROSCOPIC
Bilirubin Urine: NEGATIVE
Glucose, UA: 50 mg/dL — AB
Hgb urine dipstick: NEGATIVE
Ketones, ur: NEGATIVE mg/dL
Leukocytes,Ua: NEGATIVE
Nitrite: NEGATIVE
Protein, ur: >=300 mg/dL — AB
Specific Gravity, Urine: 1.018 (ref 1.005–1.030)
pH: 6 (ref 5.0–8.0)

## 2019-03-12 LAB — BASIC METABOLIC PANEL
Anion gap: 11 (ref 5–15)
BUN: 22 mg/dL — ABNORMAL HIGH (ref 6–20)
CO2: 21 mmol/L — ABNORMAL LOW (ref 22–32)
Calcium: 9.2 mg/dL (ref 8.9–10.3)
Chloride: 107 mmol/L (ref 98–111)
Creatinine, Ser: 1.59 mg/dL — ABNORMAL HIGH (ref 0.61–1.24)
GFR calc Af Amer: 54 mL/min — ABNORMAL LOW (ref >60)
GFR calc non Af Amer: 47 mL/min — ABNORMAL LOW (ref >60)
Glucose, Bld: 208 mg/dL — ABNORMAL HIGH (ref 70–99)
Potassium: 3.9 mmol/L (ref 3.5–5.1)
Sodium: 139 mmol/L (ref 135–145)

## 2019-03-12 LAB — CBC
HCT: 42.7 % (ref 39.0–52.0)
Hemoglobin: 13.8 g/dL (ref 13.0–17.0)
MCH: 28.5 pg (ref 26.0–34.0)
MCHC: 32.3 g/dL (ref 30.0–36.0)
MCV: 88 fL (ref 80.0–100.0)
Platelets: 212 10*3/uL (ref 150–400)
RBC: 4.85 MIL/uL (ref 4.22–5.81)
RDW: 13.8 % (ref 11.5–15.5)
WBC: 6.4 10*3/uL (ref 4.0–10.5)
nRBC: 0 % (ref 0.0–0.2)

## 2019-03-12 LAB — PROTEIN / CREATININE RATIO, URINE
Creatinine, Urine: 116.28 mg/dL
Protein Creatinine Ratio: 2.43 mg/mg{Cre} — ABNORMAL HIGH (ref 0.00–0.15)
Total Protein, Urine: 282 mg/dL

## 2019-03-12 MED ORDER — SODIUM CHLORIDE 0.9 % IV SOLN
5.0000 mg/kg | INTRAVENOUS | Status: DC
  Administered 2019-03-12: 662.5 mg via INTRAVENOUS
  Filled 2019-03-12: qty 662.5

## 2019-03-14 LAB — CMV DNA, QUANTITATIVE, PCR
CMV DNA Quant: NEGATIVE IU/mL
Log10 CMV Qn DNA Pl: UNDETERMINED log10 IU/mL

## 2019-03-17 LAB — BK QUANT PCR (PLASMA/SERUM)
BK Quantitaion PCR: NEGATIVE copies/mL
Log10 BK Qn PCR: UNDETERMINED log10copy/mL

## 2019-04-01 DIAGNOSIS — N433 Hydrocele, unspecified: Secondary | ICD-10-CM | POA: Diagnosis not present

## 2019-04-01 DIAGNOSIS — Z940 Kidney transplant status: Secondary | ICD-10-CM | POA: Diagnosis not present

## 2019-04-01 DIAGNOSIS — D899 Disorder involving the immune mechanism, unspecified: Secondary | ICD-10-CM | POA: Diagnosis not present

## 2019-04-01 DIAGNOSIS — R140 Abdominal distension (gaseous): Secondary | ICD-10-CM | POA: Diagnosis not present

## 2019-04-01 DIAGNOSIS — Z298 Encounter for other specified prophylactic measures: Secondary | ICD-10-CM | POA: Diagnosis not present

## 2019-04-01 DIAGNOSIS — K4090 Unilateral inguinal hernia, without obstruction or gangrene, not specified as recurrent: Secondary | ICD-10-CM | POA: Diagnosis not present

## 2019-04-09 ENCOUNTER — Encounter (HOSPITAL_COMMUNITY): Payer: Medicare Other

## 2019-04-14 ENCOUNTER — Other Ambulatory Visit: Payer: Self-pay | Admitting: Endocrinology

## 2019-04-15 ENCOUNTER — Other Ambulatory Visit (HOSPITAL_COMMUNITY): Payer: Self-pay | Admitting: *Deleted

## 2019-04-16 ENCOUNTER — Other Ambulatory Visit: Payer: Self-pay

## 2019-04-16 ENCOUNTER — Encounter (HOSPITAL_COMMUNITY)
Admission: RE | Admit: 2019-04-16 | Discharge: 2019-04-16 | Disposition: A | Discharge status: Home / Self Care | Payer: Medicare Other | Source: Ambulatory Visit | Attending: Internal Medicine | Admitting: Internal Medicine

## 2019-04-16 DIAGNOSIS — Z940 Kidney transplant status: Secondary | ICD-10-CM | POA: Diagnosis not present

## 2019-04-16 LAB — URINALYSIS, COMPLETE (UACMP) WITH MICROSCOPIC
Bacteria, UA: NONE SEEN
Bilirubin Urine: NEGATIVE
Glucose, UA: >=500 mg/dL — AB
Ketones, ur: NEGATIVE mg/dL
Leukocytes,Ua: NEGATIVE
Nitrite: NEGATIVE
Protein, ur: >=300 mg/dL — AB
Specific Gravity, Urine: 1.021 (ref 1.005–1.030)
pH: 6 (ref 5.0–8.0)

## 2019-04-16 LAB — BASIC METABOLIC PANEL
Anion gap: 10 (ref 5–15)
BUN: 22 mg/dL — ABNORMAL HIGH (ref 6–20)
CO2: 21 mmol/L — ABNORMAL LOW (ref 22–32)
Calcium: 9.9 mg/dL (ref 8.9–10.3)
Chloride: 107 mmol/L (ref 98–111)
Creatinine, Ser: 1.5 mg/dL — ABNORMAL HIGH (ref 0.61–1.24)
GFR calc Af Amer: 58 mL/min — ABNORMAL LOW (ref >60)
GFR calc non Af Amer: 50 mL/min — ABNORMAL LOW (ref >60)
Glucose, Bld: 167 mg/dL — ABNORMAL HIGH (ref 70–99)
Potassium: 4.1 mmol/L (ref 3.5–5.1)
Sodium: 138 mmol/L (ref 135–145)

## 2019-04-16 LAB — CBC
HCT: 43.2 % (ref 39.0–52.0)
Hemoglobin: 14 g/dL (ref 13.0–17.0)
MCH: 28.3 pg (ref 26.0–34.0)
MCHC: 32.4 g/dL (ref 30.0–36.0)
MCV: 87.3 fL (ref 80.0–100.0)
Platelets: 226 10*3/uL (ref 150–400)
RBC: 4.95 MIL/uL (ref 4.22–5.81)
RDW: 14.2 % (ref 11.5–15.5)
WBC: 8 10*3/uL (ref 4.0–10.5)
nRBC: 0 % (ref 0.0–0.2)

## 2019-04-16 LAB — PROTEIN / CREATININE RATIO, URINE
Creatinine, Urine: 96.29 mg/dL
Total Protein, Urine: 286 mg/dL

## 2019-04-16 MED ORDER — SODIUM CHLORIDE 0.9 % IV SOLN
5.0000 mg/kg | INTRAVENOUS | Status: DC
  Administered 2019-04-16: 650 mg via INTRAVENOUS
  Filled 2019-04-16: qty 650

## 2019-04-17 LAB — CMV DNA, QUANTITATIVE, PCR
CMV DNA Quant: NEGATIVE IU/mL
Log10 CMV Qn DNA Pl: UNDETERMINED log10 IU/mL

## 2019-04-21 LAB — BK QUANT PCR (PLASMA/SERUM)
BK Quantitaion PCR: NEGATIVE copies/mL
Log10 BK Qn PCR: UNDETERMINED log10copy/mL

## 2019-04-23 DIAGNOSIS — I120 Hypertensive chronic kidney disease with stage 5 chronic kidney disease or end stage renal disease: Secondary | ICD-10-CM | POA: Diagnosis not present

## 2019-04-23 DIAGNOSIS — Z79899 Other long term (current) drug therapy: Secondary | ICD-10-CM | POA: Diagnosis not present

## 2019-04-23 DIAGNOSIS — Z7982 Long term (current) use of aspirin: Secondary | ICD-10-CM | POA: Diagnosis not present

## 2019-04-23 DIAGNOSIS — N186 End stage renal disease: Secondary | ICD-10-CM | POA: Diagnosis not present

## 2019-04-23 DIAGNOSIS — Z794 Long term (current) use of insulin: Secondary | ICD-10-CM | POA: Diagnosis not present

## 2019-04-23 DIAGNOSIS — K529 Noninfective gastroenteritis and colitis, unspecified: Secondary | ICD-10-CM | POA: Diagnosis not present

## 2019-04-23 DIAGNOSIS — I10 Essential (primary) hypertension: Secondary | ICD-10-CM | POA: Diagnosis not present

## 2019-04-23 DIAGNOSIS — Z87891 Personal history of nicotine dependence: Secondary | ICD-10-CM | POA: Diagnosis not present

## 2019-04-23 DIAGNOSIS — Z7952 Long term (current) use of systemic steroids: Secondary | ICD-10-CM | POA: Diagnosis not present

## 2019-04-23 DIAGNOSIS — E1122 Type 2 diabetes mellitus with diabetic chronic kidney disease: Secondary | ICD-10-CM | POA: Diagnosis not present

## 2019-04-23 DIAGNOSIS — R1031 Right lower quadrant pain: Secondary | ICD-10-CM | POA: Diagnosis not present

## 2019-04-23 DIAGNOSIS — Z940 Kidney transplant status: Secondary | ICD-10-CM | POA: Diagnosis not present

## 2019-04-23 DIAGNOSIS — N433 Hydrocele, unspecified: Secondary | ICD-10-CM | POA: Diagnosis not present

## 2019-04-23 DIAGNOSIS — K56609 Unspecified intestinal obstruction, unspecified as to partial versus complete obstruction: Secondary | ICD-10-CM | POA: Diagnosis not present

## 2019-04-23 DIAGNOSIS — K56600 Partial intestinal obstruction, unspecified as to cause: Secondary | ICD-10-CM | POA: Diagnosis not present

## 2019-04-23 DIAGNOSIS — K5730 Diverticulosis of large intestine without perforation or abscess without bleeding: Secondary | ICD-10-CM | POA: Diagnosis not present

## 2019-04-23 DIAGNOSIS — E119 Type 2 diabetes mellitus without complications: Secondary | ICD-10-CM | POA: Diagnosis not present

## 2019-04-23 DIAGNOSIS — R109 Unspecified abdominal pain: Secondary | ICD-10-CM | POA: Diagnosis not present

## 2019-04-24 DIAGNOSIS — I120 Hypertensive chronic kidney disease with stage 5 chronic kidney disease or end stage renal disease: Secondary | ICD-10-CM | POA: Diagnosis not present

## 2019-04-24 DIAGNOSIS — E1122 Type 2 diabetes mellitus with diabetic chronic kidney disease: Secondary | ICD-10-CM | POA: Diagnosis not present

## 2019-04-24 DIAGNOSIS — N186 End stage renal disease: Secondary | ICD-10-CM | POA: Diagnosis not present

## 2019-04-24 DIAGNOSIS — Z940 Kidney transplant status: Secondary | ICD-10-CM | POA: Diagnosis not present

## 2019-04-24 DIAGNOSIS — K56600 Partial intestinal obstruction, unspecified as to cause: Secondary | ICD-10-CM | POA: Diagnosis not present

## 2019-04-27 ENCOUNTER — Telehealth: Payer: Self-pay

## 2019-04-27 DIAGNOSIS — Z79899 Other long term (current) drug therapy: Secondary | ICD-10-CM | POA: Diagnosis not present

## 2019-04-27 DIAGNOSIS — Z940 Kidney transplant status: Secondary | ICD-10-CM | POA: Diagnosis not present

## 2019-04-27 DIAGNOSIS — Z8639 Personal history of other endocrine, nutritional and metabolic disease: Secondary | ICD-10-CM | POA: Diagnosis not present

## 2019-04-27 DIAGNOSIS — I10 Essential (primary) hypertension: Secondary | ICD-10-CM | POA: Diagnosis not present

## 2019-04-27 DIAGNOSIS — E139 Other specified diabetes mellitus without complications: Secondary | ICD-10-CM | POA: Diagnosis not present

## 2019-04-27 NOTE — Telephone Encounter (Signed)
Could you please call this pt and schedule him for an appt? He has not been seen since December 2019, and he needs to be seen per Dr. Dwyane Dee in order to received additional refills.

## 2019-04-28 NOTE — Telephone Encounter (Signed)
Has this been completed?

## 2019-04-29 ENCOUNTER — Other Ambulatory Visit: Payer: Self-pay

## 2019-04-29 ENCOUNTER — Other Ambulatory Visit: Payer: Self-pay | Admitting: Endocrinology

## 2019-04-29 ENCOUNTER — Other Ambulatory Visit (INDEPENDENT_AMBULATORY_CARE_PROVIDER_SITE_OTHER): Payer: Medicare Other

## 2019-04-29 DIAGNOSIS — E1165 Type 2 diabetes mellitus with hyperglycemia: Secondary | ICD-10-CM

## 2019-04-29 DIAGNOSIS — Z794 Long term (current) use of insulin: Secondary | ICD-10-CM

## 2019-04-29 DIAGNOSIS — E039 Hypothyroidism, unspecified: Secondary | ICD-10-CM

## 2019-04-29 DIAGNOSIS — E291 Testicular hypofunction: Secondary | ICD-10-CM | POA: Diagnosis not present

## 2019-04-29 DIAGNOSIS — E782 Mixed hyperlipidemia: Secondary | ICD-10-CM

## 2019-04-29 LAB — LIPID PANEL
Cholesterol: 153 mg/dL (ref 0–200)
HDL: 54.3 mg/dL (ref >39.00)
LDL Cholesterol: 80 mg/dL (ref 0–99)
NonHDL: 98.4
Total CHOL/HDL Ratio: 3
Triglycerides: 92 mg/dL (ref 0.0–149.0)
VLDL: 18.4 mg/dL (ref 0.0–40.0)

## 2019-04-29 LAB — TESTOSTERONE: Testosterone: 348.61 ng/dL (ref 300.00–890.00)

## 2019-04-29 LAB — TSH: TSH: 2.36 u[IU]/mL (ref 0.35–4.50)

## 2019-04-29 LAB — GLUCOSE, RANDOM: Glucose, Bld: 223 mg/dL — ABNORMAL HIGH (ref 70–99)

## 2019-04-29 LAB — T4, FREE: Free T4: 0.85 ng/dL (ref 0.60–1.60)

## 2019-04-29 LAB — HEMOGLOBIN A1C: Hgb A1c MFr Bld: 7.4 % — ABNORMAL HIGH (ref 4.6–6.5)

## 2019-04-29 NOTE — Progress Notes (Signed)
Lab orders only 

## 2019-04-29 NOTE — Telephone Encounter (Signed)
Pt was scheduled for blood work today and office visit this upcoming Friday.

## 2019-04-30 LAB — FRUCTOSAMINE: Fructosamine: 281 umol/L (ref 0–285)

## 2019-05-01 ENCOUNTER — Ambulatory Visit (INDEPENDENT_AMBULATORY_CARE_PROVIDER_SITE_OTHER): Payer: Medicare Other | Admitting: Endocrinology

## 2019-05-01 ENCOUNTER — Encounter: Payer: Self-pay | Admitting: Endocrinology

## 2019-05-01 ENCOUNTER — Telehealth: Payer: Self-pay

## 2019-05-01 ENCOUNTER — Other Ambulatory Visit: Payer: Self-pay

## 2019-05-01 DIAGNOSIS — E039 Hypothyroidism, unspecified: Secondary | ICD-10-CM

## 2019-05-01 DIAGNOSIS — E1165 Type 2 diabetes mellitus with hyperglycemia: Secondary | ICD-10-CM | POA: Diagnosis not present

## 2019-05-01 DIAGNOSIS — Z794 Long term (current) use of insulin: Secondary | ICD-10-CM | POA: Diagnosis not present

## 2019-05-01 DIAGNOSIS — E291 Testicular hypofunction: Secondary | ICD-10-CM | POA: Diagnosis not present

## 2019-05-01 NOTE — Telephone Encounter (Signed)
PA initiated via [email or web address] for Testosterone 20.25mg /act(1.62%) gel. Robert [name] KeyRaoul Pitch - Rx #: [id number] Need help? Call us at [phone] Status Sent to [facility] Testosterone 20.25 MG/ACT(1.62%) gel Form Charity fundraiser PA Form (NCPDP) Original Claim Info [place] REQ-MD CALL [phone].DRUG REQUIRES PRIOR AUTHORIZATION(PHARMACY HELP DESK [id number])

## 2019-05-01 NOTE — Progress Notes (Signed)
Patient ID: Robert [name], male   DOB: [birth year], 58 y.o.   MRN: [id number]  Today's office visit was provided via telemedicine using video technique The patient was explained the limitations of evaluation and management by telemedicine and the availability of in person appointments.  The patient understood the limitations and agreed to proceed. Patient also understood that the telehealth visit is billable. . Location of the patient: Patient's home . Location of the provider: Physician office Only the patient and myself were participating in the encounter    Reason for Appointment: Endocrinology follow-up  History of Present Illness   DIABETES diagnosis date:  2012  Previous history: His blood sugar has been difficult to control since onset. Initially was taking oral hypoglycemic drugs like glyburide but could not take metformin because of renal dysfunction. Since Victoza did not help his control he was started on Lantus and then U 500 insulin also His A1c has previously ranged from 7.8-9.8, the lowest level in 03/2013 He was on Victoza but because of his markedly decreased appetite and weight loss this was stopped in 9/15  Recent history:   Insulin regimen: Tresiba 40 units daily., Humulin U-500 before meals 5-18 units Previously advised 1: 5 carbohydrate ratio  Non-hypoglycemic insulin regimen: Victoza 1.2 mg daily  His A1c is still relatively high at 7.64  Highest was 7.8 done in October Fructosamine is 281, generally about the same  Current blood sugar patterns, management and problems identified:  He has not been seen in follow-up since last December  On his own he has gone up on the dose of Tresiba, taking about twice as much  However he is not checking his readings after meals and not clear if his readings are controlled after mealtime  Also he is tending to forget to take his mealtime insulin before dinner periodically and sometimes this may cause high  readings the next morning  Also even with a full meal he may only take 5 units of Humulin R at dinnertime  He has been asked to look into the Omni pod insulin pump but he is not interested in this and not clear if his insurance was approved  He is still not consistently watching his diet and his weight appears to be about the same  He says he cannot walk because of having a hernia  Proper timing of medications in relation to meals: Yes.          Monitors blood glucose:  recently 1-2 times a day .    Glucometer: Accu-Chek         Blood Glucose readings by download   PRE-MEAL Fasting Lunch Dinner Bedtime Overall  Glucose range:  116-279  170, 174     Mean/median:  182     178   POST-MEAL PC Breakfast PC Lunch PC Dinner  Glucose range:   295   Mean/median:         Meals: 2-3 meals per day. 7-8 am and Noon and Dinner 5-7 PM .        Physical activity: exercise: Minimal       Weight control:  Wt Readings from Last 3 Encounters:  04/16/19 286 lb (129.7 kg)  03/12/19 290 lb 3 oz (131.6 kg)  02/12/19 290 lb (048.8 kg)        Complications:   nephropathy, neuropathy   Diabetes labs:   Lab Results  Component Value Date   HGBA1C 7.4 (H) 04/29/2019   HGBA1C 7.2 (H) 11/12/2018  HGBA1C 7.8 (H) 07/10/2018   Lab Results  Component Value Date   MICROALBUR 3.9 07/08/2018   LDLCALC 80 04/29/2019   CREATININE 1.50 (H) 04/16/2019    Lab Results  Component Value Date   FRUCTOSAMINE 281 04/29/2019   FRUCTOSAMINE 278 09/17/2018   FRUCTOSAMINE 284 05/08/2018     Lab on 04/29/2019  Component Date Value Ref Range Status  . Cholesterol 04/29/2019 153  0 - 200 mg/dL Final   ATP III Classification       Desirable:  < 200 mg/dL               Borderline High:  200 - 239 mg/dL          High:  > = 240 mg/dL  . Triglycerides 04/29/2019 92.0  0.0 - 149.0 mg/dL Final   Normal:  <150 mg/dLBorderline High:  150 - 199 mg/dL  . HDL 04/29/2019 54.30  >39.00 mg/dL Final  . VLDL  04/29/2019 18.4  0.0 - 40.0 mg/dL Final  . LDL Cholesterol 04/29/2019 80  0 - 99 mg/dL Final  . Total CHOL/HDL Ratio 04/29/2019 3   Final                  Men          Women1/2 Average Risk     3.4          3.3Average Risk          5.0          4.42X Average Risk          9.6          7.13X Average Risk          15.0          11.0                      . NonHDL 04/29/2019 98.40   Final   NOTE:  Non-HDL goal should be 30 mg/dL higher than patient's LDL goal (i.e. LDL goal of < 70 mg/dL, would have non-HDL goal of < 100 mg/dL)  . Fructosamine 04/29/2019 281  0 - 285 umol/L Final   Comment: Published reference interval for apparently healthy subjects between age 9 and 30 is 43 - 285 umol/L and in a poorly controlled diabetic population is 228 - 563 umol/L with a mean of 396 umol/L.   Marland Kitchen Testosterone 04/29/2019 348.61  300.00 - 890.00 ng/dL Final  . Free T4 04/29/2019 0.85  0.60 - 1.60 ng/dL Final   Comment: Specimens from patients who are undergoing biotin therapy and /or ingesting biotin supplements may contain high levels of biotin.  The higher biotin concentration in these specimens interferes with this Free T4 assay.  Specimens that contain high levels  of biotin may cause false high results for this Free T4 assay.  Please interpret results in light of the total clinical presentation of the patient.    Marland Kitchen TSH 04/29/2019 2.36  0.35 - 4.50 uIU/mL Final  . Glucose, Bld 04/29/2019 223* 70 - 99 mg/dL Final  . Hgb A1c MFr Bld 04/29/2019 7.4* 4.6 - 6.5 % Final   Glycemic Control Guidelines for People with Diabetes:Non Diabetic:  <6%Goal of Therapy: <7%Additional Action Suggested:  >8%     OTHER problems: See review of systems    Allergies as of 05/01/2019   No Known Allergies     Medication List       Accurate as of May 01, 2019  1:24 PM. If you have any questions, ask your nurse or doctor.        Accu-Chek [employer] Kit 1 each by Does not apply route daily.   Accu-Chek  FastClix Lancets Misc Use lancets to check blood sugar 5 times daily.   amLODipine 5 MG tablet Commonly known as: NORVASC Take 5 mg by mouth daily.   aspirin EC 81 MG tablet Take 81 mg by mouth daily.   DULoxetine 30 MG capsule Commonly known as: CYMBALTA Take 30 mg by mouth daily.   Fifty50 Glucose Meter 2.0 w/Device Kit Use as directed   Accu-Chek Guide w/Device Kit 1 each by Does not apply route daily. Use meter to check blood sugar 5 times daily.   fluocinonide cream 0.05 % Commonly known as: LIDEX Apply 1 application topically daily as needed (Skin inflammation (hand, thigh, back)).   furosemide 20 MG tablet Commonly known as: LASIX Take 40 mg by mouth daily as needed for edema.   gabapentin 100 MG capsule Commonly known as: NEURONTIN Take 200-300 mg by mouth See admin instructions. 200 mg in the morning and 300 mg at bedtime   glucose blood test strip Commonly known as: Accu-Chek Guide 1 each by Other route as needed for other. Use as instructed to check blood sugar 5 times daily.   HUMULIN R 500 UNIT/ML injection Generic drug: insulin regular human CONCENTRATED Using a U-100 1cc/31G/5/16" INSULIN SYRINGE, draw up and inject 5-18 units into the skin three times a day 30 minutes prior to each meal   Insulin Pen Needle 31G X 5 MM Misc Commonly known as: B-D UF III MINI PEN NEEDLES USE 4 PER DAY TO INJECT INSULIN.   Insulin Syringe-Needle U-100 31G X 5/16" 0.3 ML Misc Commonly known as: BD Insulin Syringe U/F Use to inject insulin 2-3 times per day   BD Insulin Syringe U/F 31G X 5/16" 1 ML Misc Generic drug: Insulin Syringe-Needle U-100 USE 3 TIMES A DAY WITH HUMULIN R   lactulose 10 GM/15ML solution Commonly known as: CHRONULAC Take 10 g by mouth daily as needed.   levothyroxine 100 MCG tablet Commonly known as: SYNTHROID Take 1 tablet by mouth once daily. **Pt must be seen for further refills** What changed: additional instructions   liraglutide 18  MG/3ML Sopn Commonly known as: VICTOZA Inject 0.2 mLs (1.2 mg total) into the skin daily.   lisinopril 20 MG tablet Commonly known as: ZESTRIL Take 20 mg by mouth 2 (two) times a day. Take 1 tablet ('20mg'$  total) by mouth twice daily.   metoprolol tartrate 50 MG tablet Commonly known as: LOPRESSOR TAKE 1 TABLET BY MOUTH TWICE A DAY   mycophenolate 250 MG capsule Commonly known as: CELLCEPT Take 1,000 mg by mouth every 12 (twelve) hours.   Nulojix 250 MG Solr injection Generic drug: belatacept Inject 24.5 mLs into the vein every 28 (twenty-eight) days.   predniSONE 5 MG tablet Commonly known as: DELTASONE Take 5 mg by mouth daily with breakfast.   rosuvastatin 10 MG tablet Commonly known as: CRESTOR TAKE 1 TABLET BY MOUTH EVERY DAY   senna-docusate 8.6-50 MG tablet Commonly known as: Senokot-S Take 1 tablet by mouth as needed for mild constipation.   tamsulosin 0.4 MG Caps capsule Commonly known as: FLOMAX Take 0.8 mg by mouth at bedtime.   Testosterone 20.25 MG/ACT (1.62%) Gel APPLY 3 PUMPS ON ONE ARM AND 2 ON THE OTHER EVERY MORNING ON SHOULDER AREA   traMADol 50 MG tablet Commonly  known as: ULTRAM Take 1 tablet (50 mg total) by mouth every 6 (six) hours as needed. What changed:   how much to take  reasons to take this   traZODone 50 MG tablet Commonly known as: DESYREL Take 50-100 mg by mouth at bedtime as needed for sleep.   [name] FlexTouch 100 UNIT/ML Sopn FlexTouch Pen Generic drug: insulin degludec Inject 38 units under the skin once daily. **Pt must be seen for future refills.** What changed:   how much to take  additional instructions   UNABLE TO FIND BiPAP: At bedtime   zolpidem 10 MG tablet Commonly known as: AMBIEN Take 10-20 mg by mouth at bedtime as needed for sleep.       Allergies: No Known Allergies  Past Medical History:  Diagnosis Date  . Anemia    low hgb at present  . Arthritis    FEET  . Blood transfusion without  reported diagnosis 1974   kidney removed - L  . Cancer ([place])    skin ca right shoulder, plastic dsyplasia, pre-Ca polpys removed on Colonoscopy- 07/2014  . Colon polyps 07/23/2014   Tubular adenomas x 5  . Constipation   . Diabetes mellitus without complication (HCC)    Type 2  . Difficult intubation    unsure of actual problem but it was during the January 28, 2015 procedure.  Marland Kitchen Dysrhythmia   . Eczema   . ESRD (end stage renal disease) ([place])    hEMO mwf  . Headache(784.0)    slight dull h/a due kidney failure  . Hyperlipidemia   . Hypertension    SVT h/o , followed by Dr. Claiborne Billings  . Hypothyroidism   . Macular degeneration   . Neuropathy    right hand and both feet  . PAT (paroxysmal atrial tachycardia) ([place])   . PSVT (paroxysmal supraventricular tachycardia) ([place])   . SBO (small bowel obstruction) ([place])   . Shortness of breath    with exertion  . Sleep apnea 06/18/11   split-night sleep study- [facility]., BiPAP- 13-16  . Thyroid disease   . Umbilical hernia    will repair with thi surgery    Past Surgical History:  Procedure Laterality Date  . AV FISTULA PLACEMENT Right 02/25/2015   Procedure: RIGHT ARTERIOVENOUS (AV) FISTULA CREATION;  Surgeon: Serafina Mitchell, MD;  Location: [facility];  Service: Vascular;  Laterality: Right;  . AV FISTULA PLACEMENT Right 09/15/2015   Procedure: ARTERIOVENOUS (AV) FISTULA CREATION- RIGHT ARM;  Surgeon: Serafina Mitchell, MD;  Location: [facility];  Service: Vascular;  Laterality: Right;  . CAPD INSERTION N/A 05/18/2014   Procedure: LAPAROSCOPIC INSERTION CONTINUOUS AMBULATORY PERITONEAL DIALYSIS  (CAPD) CATHETER;  Surgeon: Ralene Ok, MD;  Location: [facility];  Service: General;  Laterality: N/A;  . COLONOSCOPY    . COLONOSCOPY WITH PROPOFOL N/A 07/12/2016   Procedure: COLONOSCOPY WITH PROPOFOL;  Surgeon: [name], MD;  Location: [facility];  Service: Endoscopy;  Laterality: N/A;  . declotting of fistula  08/2015  .  ESOPHAGOGASTRODUODENOSCOPY (EGD) WITH PROPOFOL N/A 07/12/2016   Procedure: ESOPHAGOGASTRODUODENOSCOPY (EGD) WITH PROPOFOL;  Surgeon: [name], MD;  [facility];  Service: Endoscopy;  Laterality: N/A;  . FISTULA SUPERFICIALIZATION Right 02/07/2016   Procedure: RIIGHT UPPER ARM FISTULA SUPERFICIALIZATION;  Surgeon: Elam Dutch, MD;  Location: [facility];  Service: Vascular;  Laterality: Right;  . IJ catheter insertion    . INSERTION OF DIALYSIS CATHETER Right 02/25/2015   Procedure: INSERTION  OF RIGHT INTERNAL JUGULAR DIALYSIS CATHETER;  Surgeon: Serafina Mitchell, MD;  Location: [facility];  Service: Vascular;  Laterality: Right;  . KIDNEY TRANSPLANT    . LAPAROSCOPIC REPOSITIONING CAPD CATHETER N/A 06/16/2014   Procedure: LAPAROSCOPIC REPOSITIONING CAPD CATHETER;  Surgeon: Ralene Ok, MD;  Location: [facility];  Service: General;  Laterality: N/A;  . LAPAROSCOPY N/A 05/04/2015   Procedure: LAPAROSCOPY DIAGNOSTIC LYSIS OF ADHESIONS;  Surgeon: Coralie Keens, MD;  Location: [facility];  Service: General;  Laterality: N/A;  . MINOR REMOVAL OF PERITONEAL DIALYSIS CATHETER N/A 04/28/2015   Procedure:  REMOVAL OF PERITONEAL DIALYSIS CATHETER;  Surgeon: Coralie Keens, MD;  Location: [facility];  Service: General;  Laterality: N/A;  . NEPHRECTOMY Left 1974  . RENAL BIOPSY Right 2012  . REVISON OF ARTERIOVENOUS FISTULA Right 05/26/2015   Procedure: SUPERFICIALIZATION OF ARTERIOVENOUS FISTULA WITH SIDE BRANCH LIGATIONS;  Surgeon: Serafina Mitchell, MD;  Location: [facility];  Service: Vascular;  Laterality: Right;  . SKIN CANCER EXCISION     right shoulder  . SVT ABLATION N/A 11/23/2016   Procedure: SVT Ablation;  Surgeon: Will Meredith Leeds, MD;  Location: [facility];  Service: Cardiovascular;  Laterality: N/A;  . UMBILICAL HERNIA REPAIR N/A 05/18/2014   Procedure: HERNIA REPAIR UMBILICAL ADULT;  Surgeon: Ralene Ok, MD;  Location: [facility];  Service: General;  Laterality: N/A;  . UMBILICAL HERNIA  REPAIR N/A 04/28/2015   Procedure: UMBILICAL HERNIA REPAIR WITH MESH;  Surgeon: Coralie Keens, MD;  [facility];  Service: General;  Laterality: N/A;    Family History  Problem Relation Age of Onset  . Colon polyps Father   . Colon cancer Neg Hx   . Stomach cancer Neg Hx     Social History:  reports that he quit smoking about 6 years ago. His smoking use included cigars. He has never used smokeless tobacco. He reports current alcohol use. He reports that he does not use drugs.  Review of Systems:   Hypertension:    His blood pressure has been managed by his nephrologist and transplant team Also monitors at home  BP Readings from Last 3 Encounters:  04/16/19 (!) 153/65  03/12/19 (!) 156/88  02/12/19 (!) 141/70    Lipids:  His LDL is below 100 with 10 mg Crestor Has no evidence of vascular disease    Lab Results  Component Value Date   CHOL 153 04/29/2019   CHOL 166 07/10/2018   CHOL 137 05/28/2017   Lab Results  Component Value Date   HDL 54.30 04/29/2019   HDL 54.20 07/10/2018   HDL 49.60 05/28/2017   Lab Results  Component Value Date   LDLCALC 80 04/29/2019   [place] 95 07/10/2018   LDLCALC 73 05/28/2017   Lab Results  Component Value Date   TRIG 92.0 04/29/2019   TRIG 86.0 07/10/2018   TRIG 71.0 05/28/2017   Lab Results  Component Value Date   CHOLHDL 3 04/29/2019   CHOLHDL 3 07/10/2018   CHOLHDL 3 05/28/2017   No results found for: LDLDIRECT   HYPOTHYROIDISM: He has had long-standing hypothyroidism  Has been taking his 100 g levothyroxine before breakfast daily  He still complains of feeling tired and give out easily.  He also may tend to sleep more but he said this is from his low mood level  TSH normal  Lab Results  Component Value Date   TSH 2.36 04/29/2019       HYPOGONADISM: He previously has had hypogonadotropic hypogonadism  related to his metabolic syndrome and previously had subjectively improved with supplementation using  Fortesta  He was having fatigue and decreased motivation and because of his free testosterone level being low at 3.7 he was prescribed AndroGel in February 2019 His testosterone level has been inconsistent  His testosterone level is now back to normal with consistently taking 3 pumps on one arm and 2 on the other However he still has fatigue likely related to his depression  Lab Results  Component Value Date   TESTOSTERONE 348.61 04/29/2019   TESTOSTERONE 292.89 (L) 11/12/2018   TESTOSTERONE 218.35 (L) 09/17/2018     Diabetic neuropathy: Has mild sensory loss on his last exam    Examination:   There were no vitals taken for this visit.  There is no height or weight on file to calculate BMI.    ASSESSMENT/ PLAN:    Diabetes type 2 with obesity, insulin-requiring:   See history of present illness for detailed discussion of his current management, problems identified and sugar patterns on his meter download  Currently on a regimen of Tresiba and mealtime U-500 insulin with syringe  His A1c is fair at 7.4 although fructosamine is in the upper normal range and not higher than usual  He was not seen in the office but has likely gained weight He is generally not watching his diet His blood sugars have been as high as 279 even fasting He has variable compliance with diet, taking his evening insulin at dinnertime and possibly forgetting his insulin at other times also  On his own he has progressively gone up on his [name] and now taking 40 units compared to 20  Difficult to know how adequate his mealtime coverage is since he has only 3 nonfasting readings recently   Recommendations:  Start monitoring blood sugars after meals regularly to help adjust his mealtime insulin  Reminded him to consider doing a shot for Humulin R every time he is sitting down to eat and preferably doing this 30 minutes before his planned mealtime  He needs to start at least counting carbohydrates  and start again with 1: 5 ratio for mealtime coverage but may need higher dose if postprandial readings are at least over 180, previously had been suggested 1: 4 ratio  No change in [name] unless morning sugars are consistently high  Encouraged him to start walking for exercise when his hernia problem is resolved  Limit amounts of carbohydrate and fat  No regular follow-up    HYPOGONADISM: He has had therapeutic levels but requiring 5 pumps a day on his AndroGel  At least recently he says he has been compliant with this Needs to continue the same dose of 5 pumps a day using 2 on 1 side and 3 on the other  Hypothyroidism: Has been controlled with normal TSH again and he will continue same dose  LIPIDS: LDL is 80 and he will continue his statin unchanged  There are no Patient Instructions on file for this visit.  Counseling time on subjects discussed as above is over 50% of today's 25 minute visit  Follow-up in 3 months  Klay Sobotka 05/01/2019, 1:24 PM

## 2019-05-08 NOTE — Telephone Encounter (Signed)
PA had to be renewed because clinical questions were expired. New PA Terre Stowers Key: [id number] - PA Case ID: [id number] Need help? Call us at [phone] Status Sent to [facility] Testosterone 20.25 MG/ACT(1.62%) TD GEL Form [employer] Electronic PA Form (NCPDP)  Clinical questions have been answered and PA status set to Urgent. Your information has been submitted to [facility]. To check for an updated outcome later, reopen this PA request from your dashboard.  If [employer] has not responded to your request within 24 hours, contact [facility] at [phone]. If you think there may be a problem with your PA request, use our live chat feature at the bottom right.

## 2019-05-12 NOTE — Telephone Encounter (Signed)
Received fax from [place] stating that pt has been approved for Testosterone 20.25mg /act (1.62%) TD GEL. Approval good from 05/08/2019 through 05/07/2020.

## 2019-05-13 ENCOUNTER — Other Ambulatory Visit: Payer: Self-pay

## 2019-05-14 ENCOUNTER — Ambulatory Visit (HOSPITAL_COMMUNITY)
Admission: RE | Admit: 2019-05-14 | Discharge: 2019-05-14 | Disposition: A | Discharge status: Home / Self Care | Payer: Medicare Other | Source: Ambulatory Visit | Attending: Internal Medicine | Admitting: Internal Medicine

## 2019-05-14 DIAGNOSIS — Z4822 Encounter for aftercare following kidney transplant: Secondary | ICD-10-CM | POA: Insufficient documentation

## 2019-05-14 LAB — CBC
HCT: 44.5 % (ref 39.0–52.0)
Hemoglobin: 14.6 g/dL (ref 13.0–17.0)
MCH: 28.7 pg (ref 26.0–34.0)
MCHC: 32.8 g/dL (ref 30.0–36.0)
MCV: 87.6 fL (ref 80.0–100.0)
Platelets: 241 10*3/uL (ref 150–400)
RBC: 5.08 MIL/uL (ref 4.22–5.81)
RDW: 13.8 % (ref 11.5–15.5)
WBC: 5.7 10*3/uL (ref 4.0–10.5)
nRBC: 0 % (ref 0.0–0.2)

## 2019-05-14 LAB — BASIC METABOLIC PANEL
Anion gap: 8 (ref 5–15)
BUN: 17 mg/dL (ref 6–20)
CO2: 22 mmol/L (ref 22–32)
Calcium: 9.4 mg/dL (ref 8.9–10.3)
Chloride: 107 mmol/L (ref 98–111)
Creatinine, Ser: 1.53 mg/dL — ABNORMAL HIGH (ref 0.61–1.24)
GFR calc Af Amer: 57 mL/min — ABNORMAL LOW (ref >60)
GFR calc non Af Amer: 49 mL/min — ABNORMAL LOW (ref >60)
Glucose, Bld: 184 mg/dL — ABNORMAL HIGH (ref 70–99)
Potassium: 4.1 mmol/L (ref 3.5–5.1)
Sodium: 137 mmol/L (ref 135–145)

## 2019-05-14 LAB — PROTEIN / CREATININE RATIO, URINE
Creatinine, Urine: 78.1 mg/dL
Protein Creatinine Ratio: 2.61 mg/mg{Cre} — ABNORMAL HIGH (ref 0.00–0.15)
Total Protein, Urine: 204 mg/dL

## 2019-05-14 LAB — URINALYSIS, COMPLETE (UACMP) WITH MICROSCOPIC
Bacteria, UA: NONE SEEN
Bilirubin Urine: NEGATIVE
Glucose, UA: 50 mg/dL — AB
Ketones, ur: NEGATIVE mg/dL
Leukocytes,Ua: NEGATIVE
Nitrite: NEGATIVE
Protein, ur: 100 mg/dL — AB
Specific Gravity, Urine: 1.013 (ref 1.005–1.030)
pH: 6 (ref 5.0–8.0)

## 2019-05-14 MED ORDER — SODIUM CHLORIDE 0.9 % IV SOLN
5.0000 mg/kg | INTRAVENOUS | Status: DC
  Administered 2019-05-14: 662.5 mg via INTRAVENOUS
  Filled 2019-05-14: qty 662.5

## 2019-05-16 LAB — CMV DNA, QUANTITATIVE, PCR
CMV DNA Quant: NEGATIVE IU/mL
Log10 CMV Qn DNA Pl: UNDETERMINED log10 IU/mL

## 2019-05-20 LAB — BK QUANT PCR (PLASMA/SERUM)
BK Quantitaion PCR: NEGATIVE copies/mL
Log10 BK Qn PCR: UNDETERMINED log10copy/mL

## 2019-06-01 ENCOUNTER — Other Ambulatory Visit: Payer: Self-pay | Admitting: Endocrinology

## 2019-06-03 ENCOUNTER — Other Ambulatory Visit: Payer: Self-pay

## 2019-06-03 ENCOUNTER — Encounter: Payer: Self-pay | Admitting: Podiatry

## 2019-06-03 ENCOUNTER — Ambulatory Visit (INDEPENDENT_AMBULATORY_CARE_PROVIDER_SITE_OTHER): Payer: Medicare Other | Admitting: Podiatry

## 2019-06-03 DIAGNOSIS — M79675 Pain in left toe(s): Secondary | ICD-10-CM

## 2019-06-03 DIAGNOSIS — B351 Tinea unguium: Secondary | ICD-10-CM | POA: Diagnosis not present

## 2019-06-03 DIAGNOSIS — M79674 Pain in right toe(s): Secondary | ICD-10-CM | POA: Diagnosis not present

## 2019-06-03 DIAGNOSIS — E1149 Type 2 diabetes mellitus with other diabetic neurological complication: Secondary | ICD-10-CM

## 2019-06-03 NOTE — Progress Notes (Signed)
Patient ID: Robert [name], male   DOB: [birth year], 58 y.o.   MRN: [id number] Complaint:  Visit Type: Patient returns to my office for continued preventative foot care services. Complaint: Patient states" my nails have grown long and thick and become painful to walk and wear shoes" Patient has been diagnosed with DM and had a kidney transplant right side.  This is leading to increased leg swelling left.  . The patient presents for preventative foot care services. No changes to ROS  Podiatric Exam: Vascular: dorsalis pedis and posterior tibial pulses are palpable bilateral. Capillary return is immediate. Temperature gradient is WNL. Skin turgor WNL  Sensorium: Diminished  Semmes Weinstein monofilament test. Normal tactile sensation bilaterally. Nail Exam: Pt has thick disfigured discolored nails with subungual debris noted bilateral entire nail hallux through fifth toenails Ulcer Exam: There is no evidence of ulcer or pre-ulcerative changes or infection. Orthopedic Exam: Muscle tone and strength are WNL. No limitations in general ROM. No crepitus or effusions noted. Foot type and digits show no abnormalities. Bony prominences are unremarkable. HAV  B/L Skin: No Porokeratosis. No infection or ulcers  Diagnosis:  Onychomycosis, , Pain in right toe, pain in left toes, Diabetic neuropathy  Hallux limitus 1st MPJ  B/L  Treatment & Plan Procedures and Treatment: Consent by patient was obtained for treatment procedures. The patient understood the discussion of treatment and procedures well. All questions were answered thoroughly reviewed. Debridement of mycotic and hypertrophic toenails, 1 through 5 bilateral and clearing of subungual debris. No ulceration, no infection noted.  Return Visit-Office Procedure: Patient instructed to return to the office for a follow up visit 3 months for continued evaluation and treatment.    Gardiner Barefoot DPM

## 2019-06-05 DIAGNOSIS — R3914 Feeling of incomplete bladder emptying: Secondary | ICD-10-CM | POA: Diagnosis not present

## 2019-06-05 DIAGNOSIS — N401 Enlarged prostate with lower urinary tract symptoms: Secondary | ICD-10-CM | POA: Diagnosis not present

## 2019-06-11 ENCOUNTER — Other Ambulatory Visit: Payer: Self-pay

## 2019-06-11 ENCOUNTER — Ambulatory Visit (HOSPITAL_COMMUNITY)
Admission: RE | Admit: 2019-06-11 | Discharge: 2019-06-11 | Disposition: A | Discharge status: Home / Self Care | Payer: Medicare Other | Source: Ambulatory Visit | Attending: Internal Medicine | Admitting: Internal Medicine

## 2019-06-11 DIAGNOSIS — Z940 Kidney transplant status: Secondary | ICD-10-CM | POA: Diagnosis not present

## 2019-06-11 DIAGNOSIS — Z5181 Encounter for therapeutic drug level monitoring: Secondary | ICD-10-CM | POA: Insufficient documentation

## 2019-06-11 DIAGNOSIS — Z9483 Pancreas transplant status: Secondary | ICD-10-CM | POA: Diagnosis not present

## 2019-06-11 LAB — CBC
HCT: 40.7 % (ref 39.0–52.0)
Hemoglobin: 13.3 g/dL (ref 13.0–17.0)
MCH: 28.5 pg (ref 26.0–34.0)
MCHC: 32.7 g/dL (ref 30.0–36.0)
MCV: 87.2 fL (ref 80.0–100.0)
Platelets: 226 10*3/uL (ref 150–400)
RBC: 4.67 MIL/uL (ref 4.22–5.81)
RDW: 13.4 % (ref 11.5–15.5)
WBC: 5.7 10*3/uL (ref 4.0–10.5)
nRBC: 0 % (ref 0.0–0.2)

## 2019-06-11 LAB — PROTEIN / CREATININE RATIO, URINE
Creatinine, Urine: 101.52 mg/dL
Protein Creatinine Ratio: 1.44 mg/mg{Cre} — ABNORMAL HIGH (ref 0.00–0.15)
Total Protein, Urine: 146 mg/dL

## 2019-06-11 LAB — BASIC METABOLIC PANEL
Anion gap: 11 (ref 5–15)
BUN: 24 mg/dL — ABNORMAL HIGH (ref 6–20)
CO2: 22 mmol/L (ref 22–32)
Calcium: 9.4 mg/dL (ref 8.9–10.3)
Chloride: 106 mmol/L (ref 98–111)
Creatinine, Ser: 1.78 mg/dL — ABNORMAL HIGH (ref 0.61–1.24)
GFR calc Af Amer: 47 mL/min — ABNORMAL LOW (ref >60)
GFR calc non Af Amer: 41 mL/min — ABNORMAL LOW (ref >60)
Glucose, Bld: 174 mg/dL — ABNORMAL HIGH (ref 70–99)
Potassium: 4.1 mmol/L (ref 3.5–5.1)
Sodium: 139 mmol/L (ref 135–145)

## 2019-06-11 LAB — URINALYSIS, COMPLETE (UACMP) WITH MICROSCOPIC
Bacteria, UA: NONE SEEN
Bilirubin Urine: NEGATIVE
Glucose, UA: >=500 mg/dL — AB
Hgb urine dipstick: NEGATIVE
Ketones, ur: NEGATIVE mg/dL
Leukocytes,Ua: NEGATIVE
Nitrite: NEGATIVE
Protein, ur: 100 mg/dL — AB
Specific Gravity, Urine: 1.017 (ref 1.005–1.030)
pH: 5 (ref 5.0–8.0)

## 2019-06-11 MED ORDER — SODIUM CHLORIDE 0.9 % IV SOLN
5.0000 mg/kg | INTRAVENOUS | Status: DC
  Administered 2019-06-11: 675 mg via INTRAVENOUS
  Filled 2019-06-11: qty 675

## 2019-06-13 LAB — CMV DNA, QUANTITATIVE, PCR
CMV DNA Quant: NEGATIVE IU/mL
Log10 CMV Qn DNA Pl: UNDETERMINED log10 IU/mL

## 2019-06-16 LAB — BK QUANT PCR (PLASMA/SERUM)
BK Quantitaion PCR: NEGATIVE copies/mL
Log10 BK Qn PCR: UNDETERMINED log10copy/mL

## 2019-06-22 DIAGNOSIS — Z905 Acquired absence of kidney: Secondary | ICD-10-CM | POA: Diagnosis not present

## 2019-06-22 DIAGNOSIS — G4733 Obstructive sleep apnea (adult) (pediatric): Secondary | ICD-10-CM | POA: Diagnosis present

## 2019-06-22 DIAGNOSIS — Z6841 Body Mass Index (BMI) 40.0 and over, adult: Secondary | ICD-10-CM | POA: Diagnosis not present

## 2019-06-22 DIAGNOSIS — E039 Hypothyroidism, unspecified: Secondary | ICD-10-CM | POA: Diagnosis present

## 2019-06-22 DIAGNOSIS — I10 Essential (primary) hypertension: Secondary | ICD-10-CM | POA: Diagnosis not present

## 2019-06-22 DIAGNOSIS — R350 Frequency of micturition: Secondary | ICD-10-CM | POA: Diagnosis not present

## 2019-06-22 DIAGNOSIS — N433 Hydrocele, unspecified: Secondary | ICD-10-CM | POA: Diagnosis present

## 2019-06-22 DIAGNOSIS — E119 Type 2 diabetes mellitus without complications: Secondary | ICD-10-CM | POA: Diagnosis present

## 2019-06-22 DIAGNOSIS — E7849 Other hyperlipidemia: Secondary | ICD-10-CM | POA: Diagnosis present

## 2019-06-22 DIAGNOSIS — Z940 Kidney transplant status: Secondary | ICD-10-CM | POA: Diagnosis not present

## 2019-06-22 DIAGNOSIS — Z7982 Long term (current) use of aspirin: Secondary | ICD-10-CM | POA: Diagnosis not present

## 2019-06-22 DIAGNOSIS — R3912 Poor urinary stream: Secondary | ICD-10-CM | POA: Diagnosis present

## 2019-06-22 DIAGNOSIS — Z79899 Other long term (current) drug therapy: Secondary | ICD-10-CM | POA: Diagnosis not present

## 2019-06-22 DIAGNOSIS — N400 Enlarged prostate without lower urinary tract symptoms: Secondary | ICD-10-CM | POA: Diagnosis not present

## 2019-06-22 DIAGNOSIS — Z794 Long term (current) use of insulin: Secondary | ICD-10-CM | POA: Diagnosis not present

## 2019-06-22 DIAGNOSIS — N401 Enlarged prostate with lower urinary tract symptoms: Secondary | ICD-10-CM | POA: Diagnosis present

## 2019-06-22 DIAGNOSIS — R3915 Urgency of urination: Secondary | ICD-10-CM | POA: Diagnosis not present

## 2019-06-22 DIAGNOSIS — N3289 Other specified disorders of bladder: Secondary | ICD-10-CM | POA: Diagnosis present

## 2019-06-22 DIAGNOSIS — Z20828 Contact with and (suspected) exposure to other viral communicable diseases: Secondary | ICD-10-CM | POA: Diagnosis present

## 2019-06-22 DIAGNOSIS — R351 Nocturia: Secondary | ICD-10-CM | POA: Diagnosis present

## 2019-06-22 DIAGNOSIS — Z87891 Personal history of nicotine dependence: Secondary | ICD-10-CM | POA: Diagnosis not present

## 2019-06-24 DIAGNOSIS — R350 Frequency of micturition: Secondary | ICD-10-CM | POA: Diagnosis not present

## 2019-06-24 DIAGNOSIS — E039 Hypothyroidism, unspecified: Secondary | ICD-10-CM | POA: Diagnosis present

## 2019-06-24 DIAGNOSIS — G4733 Obstructive sleep apnea (adult) (pediatric): Secondary | ICD-10-CM | POA: Diagnosis present

## 2019-06-24 DIAGNOSIS — Z20828 Contact with and (suspected) exposure to other viral communicable diseases: Secondary | ICD-10-CM | POA: Diagnosis present

## 2019-06-24 DIAGNOSIS — Z6841 Body Mass Index (BMI) 40.0 and over, adult: Secondary | ICD-10-CM | POA: Diagnosis not present

## 2019-06-24 DIAGNOSIS — N400 Enlarged prostate without lower urinary tract symptoms: Secondary | ICD-10-CM | POA: Diagnosis not present

## 2019-06-24 DIAGNOSIS — Z7982 Long term (current) use of aspirin: Secondary | ICD-10-CM | POA: Diagnosis not present

## 2019-06-24 DIAGNOSIS — Z79899 Other long term (current) drug therapy: Secondary | ICD-10-CM | POA: Diagnosis not present

## 2019-06-24 DIAGNOSIS — N433 Hydrocele, unspecified: Secondary | ICD-10-CM | POA: Diagnosis present

## 2019-06-24 DIAGNOSIS — R3915 Urgency of urination: Secondary | ICD-10-CM | POA: Diagnosis not present

## 2019-06-24 DIAGNOSIS — R3912 Poor urinary stream: Secondary | ICD-10-CM | POA: Diagnosis present

## 2019-06-24 DIAGNOSIS — R351 Nocturia: Secondary | ICD-10-CM | POA: Diagnosis present

## 2019-06-24 DIAGNOSIS — N401 Enlarged prostate with lower urinary tract symptoms: Secondary | ICD-10-CM | POA: Diagnosis present

## 2019-06-24 DIAGNOSIS — E7849 Other hyperlipidemia: Secondary | ICD-10-CM | POA: Diagnosis present

## 2019-06-24 DIAGNOSIS — I10 Essential (primary) hypertension: Secondary | ICD-10-CM | POA: Diagnosis present

## 2019-06-24 DIAGNOSIS — E119 Type 2 diabetes mellitus without complications: Secondary | ICD-10-CM | POA: Diagnosis present

## 2019-06-24 DIAGNOSIS — Z940 Kidney transplant status: Secondary | ICD-10-CM | POA: Diagnosis not present

## 2019-06-24 DIAGNOSIS — Z87891 Personal history of nicotine dependence: Secondary | ICD-10-CM | POA: Diagnosis not present

## 2019-06-24 DIAGNOSIS — N3289 Other specified disorders of bladder: Secondary | ICD-10-CM | POA: Diagnosis present

## 2019-06-24 DIAGNOSIS — Z794 Long term (current) use of insulin: Secondary | ICD-10-CM | POA: Diagnosis not present

## 2019-06-24 DIAGNOSIS — Z905 Acquired absence of kidney: Secondary | ICD-10-CM | POA: Diagnosis not present

## 2019-06-26 ENCOUNTER — Other Ambulatory Visit: Payer: Self-pay | Admitting: Endocrinology

## 2019-07-07 DIAGNOSIS — E1129 Type 2 diabetes mellitus with other diabetic kidney complication: Secondary | ICD-10-CM | POA: Diagnosis not present

## 2019-07-07 DIAGNOSIS — E7800 Pure hypercholesterolemia, unspecified: Secondary | ICD-10-CM | POA: Diagnosis not present

## 2019-07-07 DIAGNOSIS — R82998 Other abnormal findings in urine: Secondary | ICD-10-CM | POA: Diagnosis not present

## 2019-07-07 DIAGNOSIS — I10 Essential (primary) hypertension: Secondary | ICD-10-CM | POA: Diagnosis not present

## 2019-07-07 DIAGNOSIS — Z125 Encounter for screening for malignant neoplasm of prostate: Secondary | ICD-10-CM | POA: Diagnosis not present

## 2019-07-07 LAB — HEPATIC FUNCTION PANEL
ALT: 18 (ref 10–40)
AST: 17 (ref 14–40)
Alkaline Phosphatase: 84 (ref 25–125)
Bilirubin, Total: 0.6

## 2019-07-07 LAB — BASIC METABOLIC PANEL
BUN: 23 — AB (ref 4–21)
Creatinine: 1.6 — AB (ref 0.6–1.3)
Glucose: 203
Potassium: 3.9 (ref 3.4–5.3)
Sodium: 139 (ref 137–147)

## 2019-07-07 LAB — LIPID PANEL
Cholesterol: 158 (ref 0–200)
HDL: 47 (ref 35–70)
LDL Cholesterol: 92
LDl/HDL Ratio: 2
Triglycerides: 93 (ref 40–160)

## 2019-07-07 LAB — MICROALBUMIN, URINE: Microalb, Ur: 547.6

## 2019-07-07 LAB — HEMOGLOBIN A1C: Hemoglobin A1C: 6.4

## 2019-07-08 ENCOUNTER — Other Ambulatory Visit: Payer: Self-pay | Admitting: Endocrinology

## 2019-07-09 ENCOUNTER — Other Ambulatory Visit: Payer: Self-pay

## 2019-07-09 ENCOUNTER — Ambulatory Visit (HOSPITAL_COMMUNITY)
Admission: RE | Admit: 2019-07-09 | Discharge: 2019-07-09 | Disposition: A | Discharge status: Home / Self Care | Payer: Medicare Other | Source: Ambulatory Visit | Attending: Internal Medicine | Admitting: Internal Medicine

## 2019-07-09 DIAGNOSIS — Z940 Kidney transplant status: Secondary | ICD-10-CM | POA: Diagnosis not present

## 2019-07-09 LAB — BASIC METABOLIC PANEL
Anion gap: 9 (ref 5–15)
BUN: 22 mg/dL — ABNORMAL HIGH (ref 6–20)
CO2: 24 mmol/L (ref 22–32)
Calcium: 9.5 mg/dL (ref 8.9–10.3)
Chloride: 107 mmol/L (ref 98–111)
Creatinine, Ser: 1.72 mg/dL — ABNORMAL HIGH (ref 0.61–1.24)
GFR calc Af Amer: 49 mL/min — ABNORMAL LOW (ref >60)
GFR calc non Af Amer: 42 mL/min — ABNORMAL LOW (ref >60)
Glucose, Bld: 187 mg/dL — ABNORMAL HIGH (ref 70–99)
Potassium: 4.1 mmol/L (ref 3.5–5.1)
Sodium: 140 mmol/L (ref 135–145)

## 2019-07-09 LAB — CBC
HCT: 39.1 % (ref 39.0–52.0)
Hemoglobin: 12.7 g/dL — ABNORMAL LOW (ref 13.0–17.0)
MCH: 28.9 pg (ref 26.0–34.0)
MCHC: 32.5 g/dL (ref 30.0–36.0)
MCV: 88.9 fL (ref 80.0–100.0)
Platelets: 233 10*3/uL (ref 150–400)
RBC: 4.4 MIL/uL (ref 4.22–5.81)
RDW: 13.9 % (ref 11.5–15.5)
WBC: 6.1 10*3/uL (ref 4.0–10.5)
nRBC: 0 % (ref 0.0–0.2)

## 2019-07-09 LAB — URINALYSIS, COMPLETE (UACMP) WITH MICROSCOPIC
Bilirubin Urine: NEGATIVE
Glucose, UA: >=500 mg/dL — AB
Ketones, ur: NEGATIVE mg/dL
Leukocytes,Ua: NEGATIVE
Nitrite: NEGATIVE
Protein, ur: 100 mg/dL — AB
RBC / HPF: >50 RBC/hpf — ABNORMAL HIGH (ref 0–5)
Specific Gravity, Urine: 1.017 (ref 1.005–1.030)
pH: 6 (ref 5.0–8.0)

## 2019-07-09 LAB — PROTEIN / CREATININE RATIO, URINE
Creatinine, Urine: 86.7 mg/dL
Protein Creatinine Ratio: 1.73 mg/mg{Cre} — ABNORMAL HIGH (ref 0.00–0.15)
Total Protein, Urine: 150 mg/dL

## 2019-07-09 MED ORDER — SODIUM CHLORIDE 0.9 % IV SOLN
5.0000 mg/kg | INTRAVENOUS | Status: DC
  Administered 2019-07-09: 675 mg via INTRAVENOUS
  Filled 2019-07-09: qty 675

## 2019-07-10 LAB — BK QUANT PCR (PLASMA/SERUM)
BK Quantitaion PCR: NEGATIVE copies/mL
Log10 BK Qn PCR: UNDETERMINED log10copy/mL

## 2019-07-10 LAB — CMV DNA, QUANTITATIVE, PCR
CMV DNA Quant: NEGATIVE IU/mL
Log10 CMV Qn DNA Pl: UNDETERMINED log10 IU/mL

## 2019-07-14 DIAGNOSIS — G629 Polyneuropathy, unspecified: Secondary | ICD-10-CM | POA: Diagnosis not present

## 2019-07-14 DIAGNOSIS — N1832 Chronic kidney disease, stage 3b: Secondary | ICD-10-CM | POA: Diagnosis not present

## 2019-07-14 DIAGNOSIS — I120 Hypertensive chronic kidney disease with stage 5 chronic kidney disease or end stage renal disease: Secondary | ICD-10-CM | POA: Diagnosis not present

## 2019-07-14 DIAGNOSIS — E1129 Type 2 diabetes mellitus with other diabetic kidney complication: Secondary | ICD-10-CM | POA: Diagnosis not present

## 2019-07-14 DIAGNOSIS — D8989 Other specified disorders involving the immune mechanism, not elsewhere classified: Secondary | ICD-10-CM | POA: Diagnosis not present

## 2019-07-14 DIAGNOSIS — I7789 Other specified disorders of arteries and arterioles: Secondary | ICD-10-CM | POA: Diagnosis not present

## 2019-07-14 DIAGNOSIS — G4733 Obstructive sleep apnea (adult) (pediatric): Secondary | ICD-10-CM | POA: Diagnosis not present

## 2019-07-14 DIAGNOSIS — Z1339 Encounter for screening examination for other mental health and behavioral disorders: Secondary | ICD-10-CM | POA: Diagnosis not present

## 2019-07-14 DIAGNOSIS — R809 Proteinuria, unspecified: Secondary | ICD-10-CM | POA: Diagnosis not present

## 2019-07-14 DIAGNOSIS — Z0000 Encounter for general adult medical examination without abnormal findings: Secondary | ICD-10-CM | POA: Diagnosis not present

## 2019-07-14 DIAGNOSIS — E291 Testicular hypofunction: Secondary | ICD-10-CM | POA: Diagnosis not present

## 2019-07-14 DIAGNOSIS — Z940 Kidney transplant status: Secondary | ICD-10-CM | POA: Diagnosis not present

## 2019-07-20 DIAGNOSIS — Z1212 Encounter for screening for malignant neoplasm of rectum: Secondary | ICD-10-CM | POA: Diagnosis not present

## 2019-07-25 DIAGNOSIS — Z20828 Contact with and (suspected) exposure to other viral communicable diseases: Secondary | ICD-10-CM | POA: Diagnosis not present

## 2019-07-29 ENCOUNTER — Other Ambulatory Visit: Payer: Self-pay | Admitting: Endocrinology

## 2019-07-31 IMAGING — CT CT ABD-PELV W/O CM
2 of 4 series · 15 of 46 positions shown, 17 images · non-contrast
Comparison: 06/04/2017, 05/11/2015

CLINICAL DATA: GI bleed history of colon polyp

EXAM:
CT ABDOMEN AND PELVIS WITHOUT CONTRAST
TECHNIQUE: Multidetector CT imaging of the abdomen and pelvis was performed
following the standard protocol without IV contrast.

[Series 3: abd/ pelvis 5.0 i30f 2 · axial · 0.89mm/px · z∈[+858,+1408]mm · 12 of 122 slices shown, 14 images]
[im 6/122  soft-tissue]
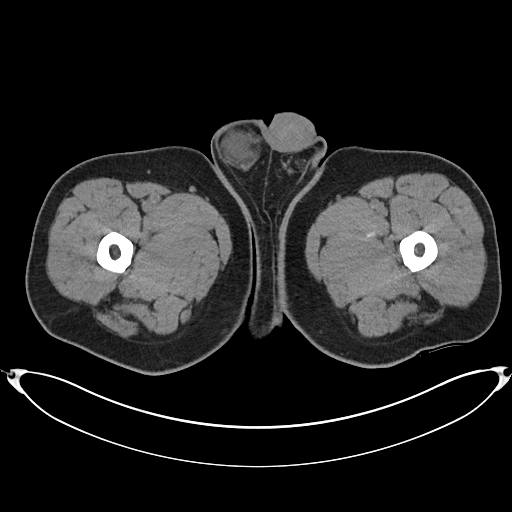
[im 6/122  bone]
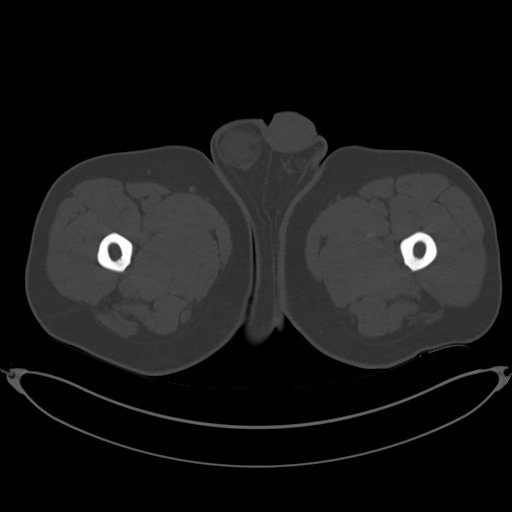
[im 16/122  soft-tissue]
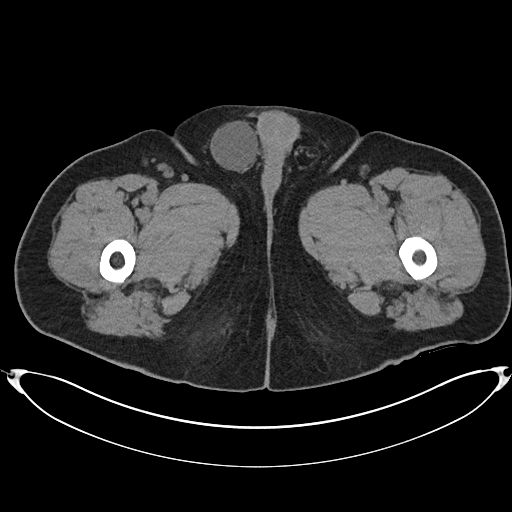
[im 27/122  soft-tissue]
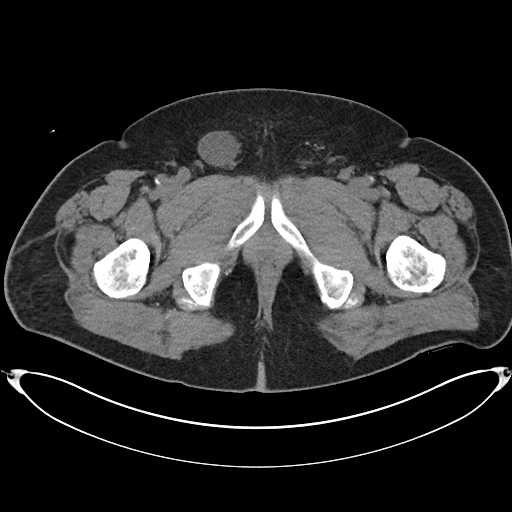
[im 37/122  soft-tissue]
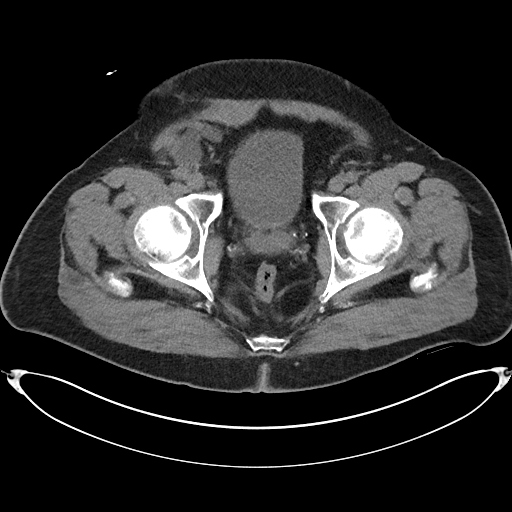
[im 48/122  soft-tissue]
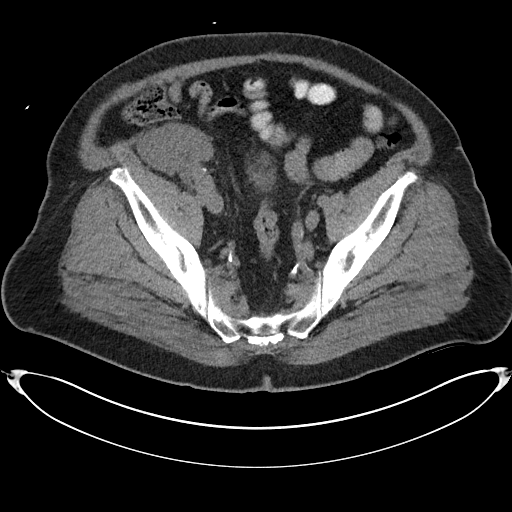
[im 58/122  soft-tissue]
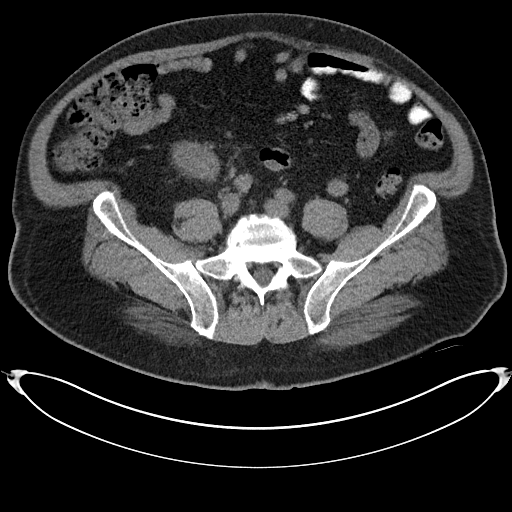
[im 64/122  soft-tissue]
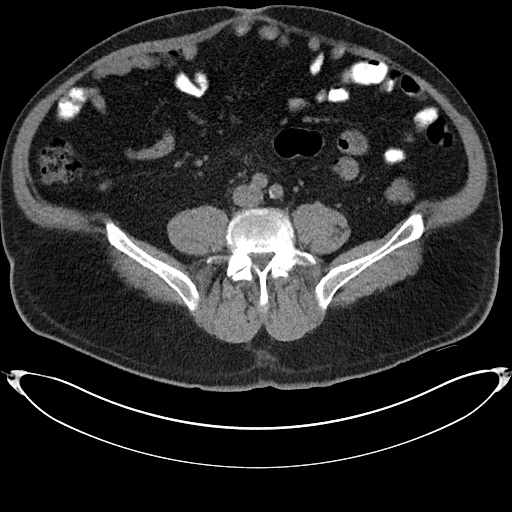
[im 74/122  soft-tissue]
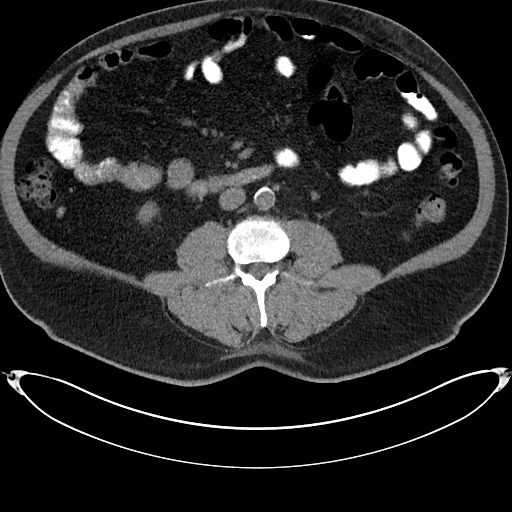
[im 85/122  soft-tissue]
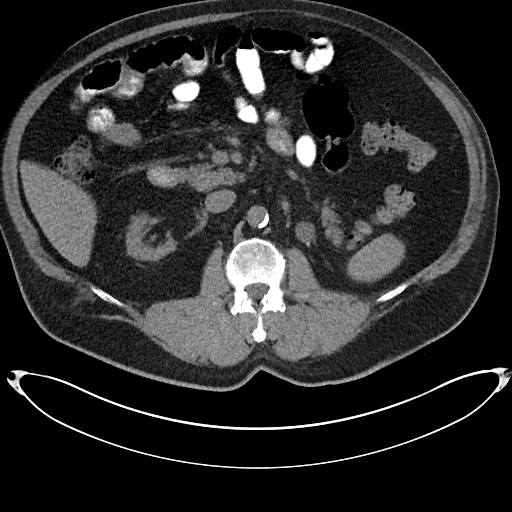
[im 85/122  bone]
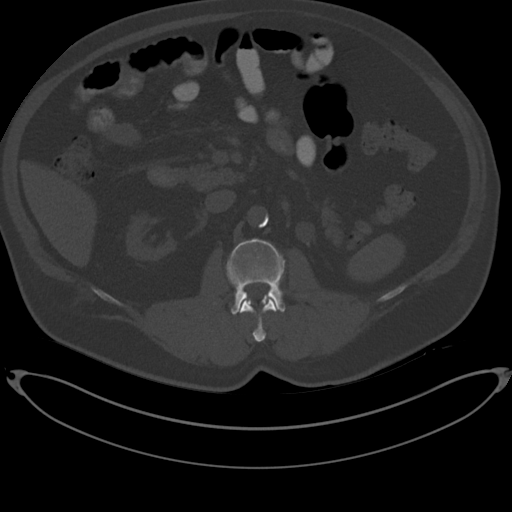
[im 95/122  soft-tissue]
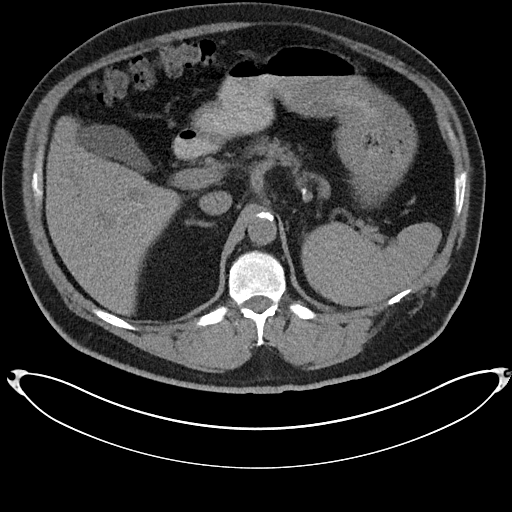
[im 106/122  soft-tissue]
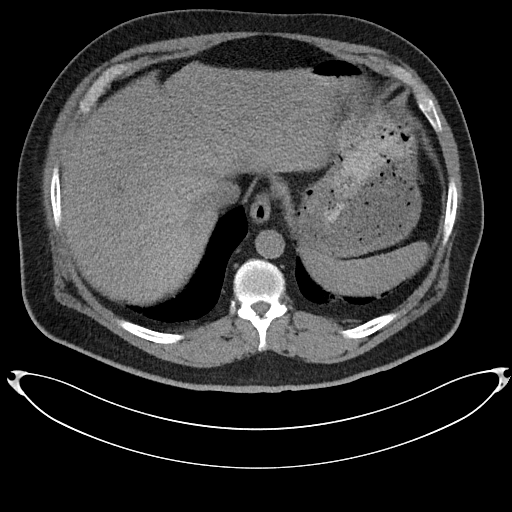
[im 116/122  soft-tissue]
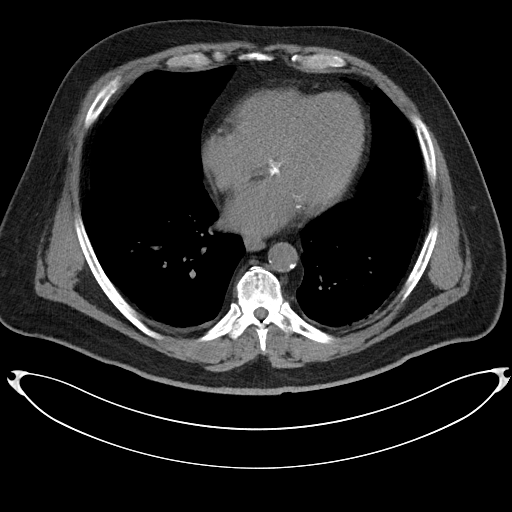

[Series 6: cor st · coronal · 0.90mm/px · 3 of 110 slices shown]
[im 37/110  soft-tissue]
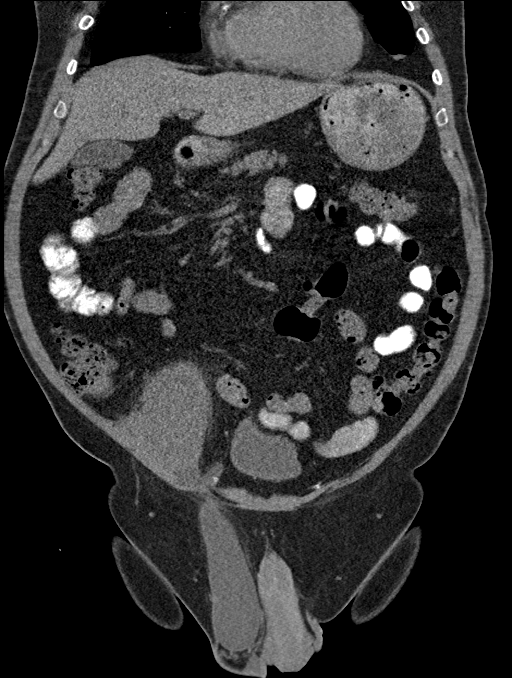
[im 49/110  soft-tissue]
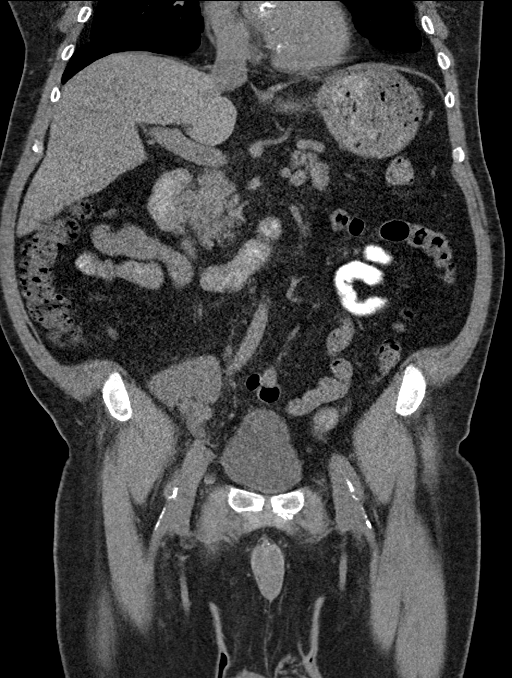
[im 61/110  soft-tissue]
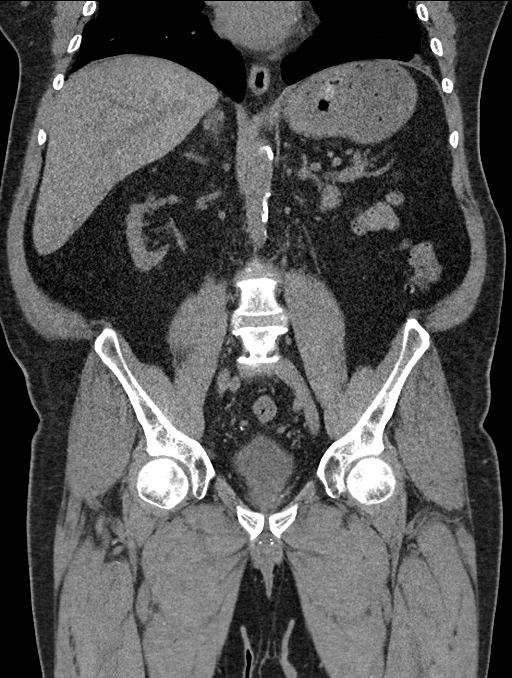

[15 of 46 positions shown; findings below may reference images not displayed]

FINDINGS: Lower chest: Lung bases demonstrate no acute consolidation. Mild
atelectasis at the posterior lung bases. No significant pleural
effusion

Hepatobiliary: No focal liver abnormality is seen. No gallstones,
gallbladder wall thickening, or biliary dilatation.

Pancreas: Unremarkable. No pancreatic ductal dilatation or
surrounding inflammatory changes.

Spleen: Normal in size without focal abnormality.

Adrenals/Urinary Tract: Adrenal glands are within normal limits.
Atrophic right kidney. Stable round soft tissue density in the
expected location of left kidney measuring 18 mm. Bladder
unremarkable. Transplanted kidney in the right lower quadrant with
minimal perinephric stranding. No hydronephrosis.

Stomach/Bowel: The stomach is nonenlarged. No dilated small bowel.
Small lipoma in the distal ileum, re- demonstrated. No colon wall
thickening. Mild sigmoid colon diverticular disease without acute
inflammation.

Vascular/Lymphatic: Aortic atherosclerosis. No aneurysmal
dilatation. No significantly enlarged lymph nodes

Reproductive: Prostate is unremarkable.

Other: Oblong fluid density in extending into the right inguinal
canal is re- demonstrated, this measures 16 cm cranial caudad by
cm transverse and emanate from the lower pole region of the right
lower quadrant transplanted kidney. No free air or significant free
fluid

Musculoskeletal: Degenerative changes of the spine. No acute or
suspicious bone lesion
IMPRESSION: 1. No CT evidence for acute intra-abdominal or pelvic abnormality.
2. Atrophic native right kidney. No visible left native renal tissue
with stable round soft tissue density at the expected location of
the left kidney.
3. Transplanted right lower quadrant kidney with nonspecific
perinephric fat stranding. Re- demonstrated large oblong fluid
collection extending from the lower pole of right lower quadrant
transplant kidney, into the right inguinal canal.
4. Mild sigmoid colon diverticular disease without acute
inflammation.

## 2019-08-03 ENCOUNTER — Other Ambulatory Visit (INDEPENDENT_AMBULATORY_CARE_PROVIDER_SITE_OTHER): Payer: Medicare Other

## 2019-08-03 ENCOUNTER — Other Ambulatory Visit: Payer: Self-pay

## 2019-08-03 DIAGNOSIS — Z794 Long term (current) use of insulin: Secondary | ICD-10-CM

## 2019-08-03 DIAGNOSIS — E039 Hypothyroidism, unspecified: Secondary | ICD-10-CM

## 2019-08-03 DIAGNOSIS — E291 Testicular hypofunction: Secondary | ICD-10-CM | POA: Diagnosis not present

## 2019-08-03 DIAGNOSIS — E1165 Type 2 diabetes mellitus with hyperglycemia: Secondary | ICD-10-CM

## 2019-08-03 LAB — URINALYSIS, ROUTINE W REFLEX MICROSCOPIC
Bilirubin Urine: NEGATIVE
Ketones, ur: NEGATIVE
Leukocytes,Ua: NEGATIVE
Nitrite: NEGATIVE
Specific Gravity, Urine: 1.025 (ref 1.000–1.030)
Total Protein, Urine: 100 — AB
Urine Glucose: NEGATIVE
Urobilinogen, UA: 0.2 (ref 0.0–1.0)
pH: 6 (ref 5.0–8.0)

## 2019-08-03 LAB — COMPREHENSIVE METABOLIC PANEL
ALT: 23 U/L (ref 0–53)
AST: 17 U/L (ref 0–37)
Albumin: 4.3 g/dL (ref 3.5–5.2)
Alkaline Phosphatase: 92 U/L (ref 39–117)
BUN: 31 mg/dL — ABNORMAL HIGH (ref 6–23)
CO2: 26 mEq/L (ref 19–32)
Calcium: 9.9 mg/dL (ref 8.4–10.5)
Chloride: 104 mEq/L (ref 96–112)
Creatinine, Ser: 1.6 mg/dL — ABNORMAL HIGH (ref 0.40–1.50)
GFR: 44.28 mL/min — ABNORMAL LOW (ref >60.00)
Glucose, Bld: 133 mg/dL — ABNORMAL HIGH (ref 70–99)
Potassium: 4.4 mEq/L (ref 3.5–5.1)
Sodium: 139 mEq/L (ref 135–145)
Total Bilirubin: 0.7 mg/dL (ref 0.2–1.2)
Total Protein: 6.4 g/dL (ref 6.0–8.3)

## 2019-08-03 LAB — HEMOGLOBIN A1C: Hgb A1c MFr Bld: 7.2 % — ABNORMAL HIGH (ref 4.6–6.5)

## 2019-08-03 LAB — MICROALBUMIN / CREATININE URINE RATIO
Creatinine,U: 72.5 mg/dL
Microalb Creat Ratio: 201.8 mg/g — ABNORMAL HIGH (ref 0.0–30.0)
Microalb, Ur: 146.3 mg/dL — ABNORMAL HIGH (ref 0.0–1.9)

## 2019-08-03 LAB — CBC
HCT: 42.7 % (ref 39.0–52.0)
Hemoglobin: 14 g/dL (ref 13.0–17.0)
MCHC: 32.8 g/dL (ref 30.0–36.0)
MCV: 86.1 fl (ref 78.0–100.0)
Platelets: 227 10*3/uL (ref 150.0–400.0)
RBC: 4.96 Mil/uL (ref 4.22–5.81)
RDW: 14.9 % (ref 11.5–15.5)
WBC: 6.8 10*3/uL (ref 4.0–10.5)

## 2019-08-03 LAB — TESTOSTERONE: Testosterone: 243.59 ng/dL — ABNORMAL LOW (ref 300.00–890.00)

## 2019-08-03 LAB — TSH: TSH: 1.8 u[IU]/mL (ref 0.35–4.50)

## 2019-08-04 ENCOUNTER — Other Ambulatory Visit: Payer: Self-pay

## 2019-08-04 ENCOUNTER — Telehealth: Payer: Self-pay | Admitting: Gastroenterology

## 2019-08-04 NOTE — Telephone Encounter (Signed)
The pt has been advised and has already spoken with his PCP.  He will call if he has any symptoms or concerns prior to his current recall of 07/2021

## 2019-08-04 NOTE — Telephone Encounter (Signed)
Dr Ardis Hughs the pt is ok with having colon at this time if needed but he would like you to know that he had a TURP and hydrocele surgery on 9/16 and completed the heme assure on 10/12.  He believes that may have been the cause of the microscopic blood.  Please advise

## 2019-08-04 NOTE — Telephone Encounter (Signed)
Interesting.  I think it is probably OK to NOT proceed with colonoscopy but he needs to make sure that his PCP agrees with that decision and so recommend that he talk, OV with his PCP to discuss further.  I am happy to help if needed.

## 2019-08-04 NOTE — Telephone Encounter (Signed)
I was sent a positive heme assure test result, dated July 20, 2019.  I called his primary care office to understand the context.  I spoke with his nurse.  It sounds like this was just a routine, annual Hemoccult check; given the results I recommended repeat colonoscopy at the patient's soonest convenience and we will reach out to him to schedule it.   Patty, Please let him know that his PCP checked his stool and there was microscopic blood in it and so I recommended repeat colonoscopy at his soonest convenience.

## 2019-08-06 ENCOUNTER — Encounter: Payer: Self-pay | Admitting: Endocrinology

## 2019-08-06 ENCOUNTER — Ambulatory Visit (INDEPENDENT_AMBULATORY_CARE_PROVIDER_SITE_OTHER): Payer: Medicare Other | Admitting: Endocrinology

## 2019-08-06 ENCOUNTER — Ambulatory Visit (HOSPITAL_COMMUNITY)
Admission: RE | Admit: 2019-08-06 | Discharge: 2019-08-06 | Disposition: A | Discharge status: Home / Self Care | Payer: Medicare Other | Source: Ambulatory Visit | Attending: Internal Medicine | Admitting: Internal Medicine

## 2019-08-06 ENCOUNTER — Other Ambulatory Visit: Payer: Self-pay

## 2019-08-06 VITALS — BP 140/80 | HR 76 | Ht 71.0 in | Wt 290.6 lb

## 2019-08-06 DIAGNOSIS — Z794 Long term (current) use of insulin: Secondary | ICD-10-CM | POA: Diagnosis not present

## 2019-08-06 DIAGNOSIS — E1165 Type 2 diabetes mellitus with hyperglycemia: Secondary | ICD-10-CM

## 2019-08-06 DIAGNOSIS — E291 Testicular hypofunction: Secondary | ICD-10-CM | POA: Diagnosis not present

## 2019-08-06 DIAGNOSIS — Z940 Kidney transplant status: Secondary | ICD-10-CM | POA: Diagnosis not present

## 2019-08-06 DIAGNOSIS — E782 Mixed hyperlipidemia: Secondary | ICD-10-CM

## 2019-08-06 DIAGNOSIS — E1142 Type 2 diabetes mellitus with diabetic polyneuropathy: Secondary | ICD-10-CM | POA: Diagnosis not present

## 2019-08-06 DIAGNOSIS — E039 Hypothyroidism, unspecified: Secondary | ICD-10-CM

## 2019-08-06 LAB — BASIC METABOLIC PANEL
Anion gap: 9 (ref 5–15)
BUN: 24 mg/dL — ABNORMAL HIGH (ref 6–20)
CO2: 22 mmol/L (ref 22–32)
Calcium: 9 mg/dL (ref 8.9–10.3)
Chloride: 106 mmol/L (ref 98–111)
Creatinine, Ser: 1.79 mg/dL — ABNORMAL HIGH (ref 0.61–1.24)
GFR calc Af Amer: 47 mL/min — ABNORMAL LOW (ref >60)
GFR calc non Af Amer: 40 mL/min — ABNORMAL LOW (ref >60)
Glucose, Bld: 227 mg/dL — ABNORMAL HIGH (ref 70–99)
Potassium: 4.2 mmol/L (ref 3.5–5.1)
Sodium: 137 mmol/L (ref 135–145)

## 2019-08-06 LAB — URINALYSIS, COMPLETE (UACMP) WITH MICROSCOPIC
Bilirubin Urine: NEGATIVE
Glucose, UA: >=500 mg/dL — AB
Ketones, ur: NEGATIVE mg/dL
Nitrite: NEGATIVE
Protein, ur: >=300 mg/dL — AB
RBC / HPF: >50 RBC/hpf — ABNORMAL HIGH (ref 0–5)
Specific Gravity, Urine: 1.017 (ref 1.005–1.030)
pH: 5 (ref 5.0–8.0)

## 2019-08-06 LAB — CBC
HCT: 42.3 % (ref 39.0–52.0)
Hemoglobin: 13.8 g/dL (ref 13.0–17.0)
MCH: 28.7 pg (ref 26.0–34.0)
MCHC: 32.6 g/dL (ref 30.0–36.0)
MCV: 87.9 fL (ref 80.0–100.0)
Platelets: 226 10*3/uL (ref 150–400)
RBC: 4.81 MIL/uL (ref 4.22–5.81)
RDW: 14 % (ref 11.5–15.5)
WBC: 7.7 10*3/uL (ref 4.0–10.5)
nRBC: 0 % (ref 0.0–0.2)

## 2019-08-06 LAB — PROTEIN / CREATININE RATIO, URINE
Creatinine, Urine: 89.93 mg/dL
Protein Creatinine Ratio: 2.54 mg/mg{Cre} — ABNORMAL HIGH (ref 0.00–0.15)
Total Protein, Urine: 228 mg/dL

## 2019-08-06 MED ORDER — SODIUM CHLORIDE 0.9 % IV SOLN
5.0000 mg/kg | INTRAVENOUS | Status: DC
  Administered 2019-08-06: 12:00:00 675 mg via INTRAVENOUS
  Filled 2019-08-06: qty 675

## 2019-08-06 NOTE — Progress Notes (Signed)
Patient ID: Robert [name], male   DOB: [birth year], 58 y.o.   MRN: [id number]      Reason for Appointment: Endocrinology follow-up  History of Present Illness   DIABETES diagnosis date:  2012  Previous history: His blood sugar has been difficult to control since onset. Initially was taking oral hypoglycemic drugs like glyburide but could not take metformin because of renal dysfunction. Since Victoza did not help his control he was started on Lantus and then U 500 insulin also His A1c has previously ranged from 7.8-9.8, the lowest level in 03/2013 He was on Victoza but because of his markedly decreased appetite and weight loss this was stopped in 9/15  Recent history:   Insulin regimen: Tresiba 41 units daily., Humulin U-500 before meals 5- 10 units Previously following 1: 5 carbohydrate ratio  Non-hypoglycemic insulin regimen: Victoza 1.2 mg daily  His A1c is still relatively high at 7.2  Highest was 7.8 done in October Fructosamine previously was 281, generally about the same  Current blood sugar patterns, management and problems identified:    It was recommended that he start doing CGM but because of his Medicare requirements he was told to document checking blood sugars 4 times a day and he did not do so  He says because of other issues he is not paying attention to his diabetes  Still has not started checking blood sugars after meals  His concern is about the cost of Victoza but he is taking it  He was told to continue adjusting his [name] based on fasting readings is not doing so despite his fasting blood sugars being consistently high at least in the last 10 days also  Blood sugars midday and afternoon are checked infrequently and are mostly high  He only has a couple of near normal readings early morning  He now says that his blood sugars may be higher in the morning because he will get up and have some snacks like prunes during the night  Mostly  eating 2 meals a day with first meal around 10 AM  As before he may not take his Humulin R insulin before starting to eat  Appears that he is taking his morning Humulin R based on his blood sugar level rather than combination of this and carbohydrate intake  Also not closely following his carbohydrate content at dinnertime for insulin adjustment   Proper timing of medications in relation to meals: Yes.          Monitors blood glucose:  recently 1-2 times a day .    Glucometer: Accu-Chek         Blood Glucose readings by download   PRE-MEAL Fasting Lunch  PC lunch Bedtime Overall  Glucose range: 107-285  203-317  165    Mean/median:     212   Previous readings:  PRE-MEAL Fasting Lunch Dinner Bedtime Overall  Glucose range:  116-279  170, 174     Mean/median:  182     178   POST-MEAL PC Breakfast PC Lunch PC Dinner  Glucose range:   295   Mean/median:       Meals: 2-3 meals per day. 7-8 am and Noon and Dinner 5-7 PM .        Physical activity: exercise: yard work       Weight control:  Wt Readings from Last 3 Encounters:  08/06/19 290 lb 9.6 oz (131.8 kg)  07/09/19 287 lb 14.4 oz (130.6 kg)  06/11/19 296  lb (177.1 kg)        Complications:   nephropathy, neuropathy   Diabetes labs:   Lab Results  Component Value Date   HGBA1C 7.2 (H) 08/03/2019   HGBA1C 6.4 07/07/2019   HGBA1C 7.4 (H) 04/29/2019   Lab Results  Component Value Date   MICROALBUR 146.3 (H) 08/03/2019   LDLCALC 92 07/07/2019   CREATININE 1.60 (H) 08/03/2019    Lab Results  Component Value Date   FRUCTOSAMINE 281 04/29/2019   FRUCTOSAMINE 278 09/17/2018   FRUCTOSAMINE 284 05/08/2018     Lab on 08/03/2019  Component Date Value Ref Range Status   WBC 08/03/2019 6.8  4.0 - 10.5 K/uL Final   RBC 08/03/2019 4.96  4.22 - 5.81 Mil/uL Final   Platelets 08/03/2019 227.0  150.0 - 400.0 K/uL Final   Hemoglobin 08/03/2019 14.0  13.0 - 17.0 g/dL Final   HCT 08/03/2019 42.7  39.0 - 52.0 %  Final   MCV 08/03/2019 86.1  78.0 - 100.0 fl Final   MCHC 08/03/2019 32.8  30.0 - 36.0 g/dL Final   RDW 08/03/2019 14.9  11.5 - 15.5 % Final   Testosterone 08/03/2019 243.59* 300.00 - 890.00 ng/dL Final   TSH 08/03/2019 1.80  0.35 - 4.50 uIU/mL Final   Color, Urine 08/03/2019 YELLOW  Yellow;Lt. Yellow;Straw;Dark Yellow;Amber;Green;Red;Brown Final   APPearance 08/03/2019 Sl Cloudy* Clear;Turbid;Slightly Cloudy;Cloudy Final   Specific Gravity, Urine 08/03/2019 1.025  1.000 - 1.030 Final   pH 08/03/2019 6.0  5.0 - 8.0 Final   Total Protein, Urine 08/03/2019 100* Negative Final   Urine Glucose 08/03/2019 NEGATIVE  Negative Final   Ketones, ur 08/03/2019 NEGATIVE  Negative Final   Bilirubin Urine 08/03/2019 NEGATIVE  Negative Final   Hgb urine dipstick 08/03/2019 MODERATE* Negative Final   Urobilinogen, UA 08/03/2019 0.2  0.0 - 1.0 Final   Leukocytes,Ua 08/03/2019 NEGATIVE  Negative Final   Nitrite 08/03/2019 NEGATIVE  Negative Final   WBC, UA 08/03/2019 3-6/hpf* 0-2/hpf Final   RBC / HPF 08/03/2019 11-20/hpf* 0-2/hpf Final   Microalb, Ur 08/03/2019 146.3* 0.0 - 1.9 mg/dL Final   Creatinine,U 08/03/2019 72.5  mg/dL Final   Microalb Creat Ratio 08/03/2019 201.8* 0.0 - 30.0 mg/g Final   Sodium 08/03/2019 139  135 - 145 mEq/L Final   Potassium 08/03/2019 4.4  3.5 - 5.1 mEq/L Final   Chloride 08/03/2019 104  96 - 112 mEq/L Final   CO2 08/03/2019 26  19 - 32 mEq/L Final   Glucose, Bld 08/03/2019 133* 70 - 99 mg/dL Final   BUN 08/03/2019 31* 6 - 23 mg/dL Final   Creatinine, Ser 08/03/2019 1.60* 0.40 - 1.50 mg/dL Final   Total Bilirubin 08/03/2019 0.7  0.2 - 1.2 mg/dL Final   Alkaline Phosphatase 08/03/2019 92  39 - 117 U/L Final   AST 08/03/2019 17  0 - 37 U/L Final   ALT 08/03/2019 23  0 - 53 U/L Final   Total Protein 08/03/2019 6.4  6.0 - 8.3 g/dL Final   Albumin 08/03/2019 4.3  3.5 - 5.2 g/dL Final   Calcium 08/03/2019 9.9  8.4 - 10.5 mg/dL Final    GFR 08/03/2019 44.28* >60.00 mL/min Final   Hgb A1c MFr Bld 08/03/2019 7.2* 4.6 - 6.5 % Final   Glycemic Control Guidelines for People with Diabetes:Non Diabetic:  <6%Goal of Therapy: <7%Additional Action Suggested:  >8%     OTHER problems: See review of systems    Allergies as of 08/06/2019   No Known Allergies  Medication List       Accurate as of August 06, 2019 10:26 AM. If you have any questions, ask your nurse or doctor.        Accu-Chek [employer] Kit 1 each by Does not apply route daily.   Accu-Chek FastClix Lancets Misc Use lancets to check blood sugar 5 times daily.   amLODipine 5 MG tablet Commonly known as: NORVASC Take 5 mg by mouth daily.   aspirin EC 81 MG tablet Take 81 mg by mouth daily.   B-D UF III MINI PEN NEEDLES 31G X 5 MM Misc Generic drug: Insulin Pen Needle USE 4 PER DAY TO INJECT INSULIN.   DULoxetine 30 MG capsule Commonly known as: CYMBALTA Take 30 mg by mouth daily.   Fifty50 Glucose Meter 2.0 w/Device Kit Use as directed   Accu-Chek Guide w/Device Kit 1 each by Does not apply route daily. Use meter to check blood sugar 5 times daily.   fluocinonide cream 0.05 % Commonly known as: LIDEX Apply 1 application topically daily as needed (Skin inflammation (hand, thigh, back)).   furosemide 20 MG tablet Commonly known as: LASIX Take 40 mg by mouth daily as needed for edema.   gabapentin 100 MG capsule Commonly known as: NEURONTIN Take 200-300 mg by mouth See admin instructions. 200 mg in the morning and 300 mg at bedtime   glucose blood test strip Commonly known as: Accu-Chek Guide 1 each by Other route as needed for other. Use as instructed to check blood sugar 5 times daily.   HUMULIN R 500 UNIT/ML injection Generic drug: insulin regular human CONCENTRATED Using a U-100 1cc/31G/5/16" INSULIN SYRINGE, draw up and inject 5-18 units into the skin three times a day 30 minutes prior to each meal   Insulin Syringe-Needle  U-100 31G X 5/16" 0.3 ML Misc Commonly known as: BD Insulin Syringe U/F Use to inject insulin 2-3 times per day   BD Insulin Syringe U/F 31G X 5/16" 1 ML Misc Generic drug: Insulin Syringe-Needle U-100 USE 3 TIMES A DAY WITH HUMULIN R   lactulose 10 GM/15ML solution Commonly known as: CHRONULAC Take 10 g by mouth daily as needed.   levothyroxine 100 MCG tablet Commonly known as: SYNTHROID Take 1 tablet by mouth once daily.   liraglutide 18 MG/3ML Sopn Commonly known as: VICTOZA Inject 0.2 mLs (1.2 mg total) into the skin daily.   lisinopril 20 MG tablet Commonly known as: ZESTRIL Take 20 mg by mouth 2 (two) times a day. Take 1 tablet (73m total) by mouth twice daily.   metoprolol tartrate 50 MG tablet Commonly known as: LOPRESSOR TAKE 1 TABLET BY MOUTH TWICE A DAY   mycophenolate 250 MG capsule Commonly known as: CELLCEPT Take 1,000 mg by mouth every 12 (twelve) hours.   Nulojix 250 MG Solr injection Generic drug: belatacept Inject 24.5 mLs into the vein every 28 (twenty-eight) days.   predniSONE 5 MG tablet Commonly known as: DELTASONE Take 5 mg by mouth daily with breakfast.   rosuvastatin 10 MG tablet Commonly known as: CRESTOR TAKE 1 TABLET BY MOUTH EVERY DAY   senna-docusate 8.6-50 MG tablet Commonly known as: Senokot-S Take 1 tablet by mouth as needed for mild constipation.   tamsulosin 0.4 MG Caps capsule Commonly known as: FLOMAX Take 0.8 mg by mouth at bedtime.   Testosterone 20.25 MG/ACT (1.62%) Gel APPLY 3 PUMPS ON ONE ARM AND 2 ON THE OTHER EVERY MORNING ON SHOULDER AREA   traMADol 50 MG tablet Commonly known  as: ULTRAM Take 1 tablet (50 mg total) by mouth every 6 (six) hours as needed. What changed:   how much to take  reasons to take this   traZODone 50 MG tablet Commonly known as: DESYREL Take 50-100 mg by mouth at bedtime as needed for sleep.   [name] FlexTouch 100 UNIT/ML Sopn FlexTouch Pen Generic drug: insulin degludec INJECT  38 UNITS UNDER THE SKIN ONCE DAILY. **PT MUST BE SEEN FOR FUTURE REFILLS.** What changed: See the new instructions.   UNABLE TO FIND BiPAP: At bedtime   zolpidem 10 MG tablet Commonly known as: AMBIEN Take 10-20 mg by mouth at bedtime as needed for sleep.       Allergies: No Known Allergies  Past Medical History:  Diagnosis Date   Anemia    low hgb at present   Arthritis    FEET   Blood transfusion without reported diagnosis 1974   kidney removed - L   Cancer ([place])    skin ca right shoulder, plastic dsyplasia, pre-Ca polpys removed on Colonoscopy- 07/2014   Colon polyps 07/23/2014   Tubular adenomas x 5   Constipation    Diabetes mellitus without complication ([facility])    Type 2   Difficult intubation    unsure of actual problem but it was during the January 28, 2015 procedure.   Dysrhythmia    Eczema    ESRD (end stage renal disease) (HCC)    hEMO mwf   Headache(784.0)    slight dull h/a due kidney failure   Hyperlipidemia    Hypertension    SVT h/o , followed by Dr. Claiborne Billings   Hypothyroidism    Macular degeneration    Neuropathy    right hand and both feet   PAT (paroxysmal atrial tachycardia) (HCC)    PSVT (paroxysmal supraventricular tachycardia) (HCC)    SBO (small bowel obstruction) (HCC)    Shortness of breath    with exertion   Sleep apnea 06/18/11   split-night sleep study- [facility]., BiPAP- 13-16   Thyroid disease    Umbilical hernia    will repair with thi surgery    Past Surgical History:  Procedure Laterality Date   AV FISTULA PLACEMENT Right 02/25/2015   Procedure: RIGHT ARTERIOVENOUS (AV) FISTULA CREATION;  Surgeon: Serafina Mitchell, MD;  Location: [place];  Service: Vascular;  Laterality: Right;   AV FISTULA PLACEMENT Right 09/15/2015   Procedure: ARTERIOVENOUS (AV) FISTULA CREATION- RIGHT ARM;  Surgeon: Serafina Mitchell, MD;  Location: [place];  Service: Vascular;  Laterality: Right;   CAPD INSERTION  N/A 05/18/2014   Procedure: LAPAROSCOPIC INSERTION CONTINUOUS AMBULATORY PERITONEAL DIALYSIS  (CAPD) CATHETER;  Surgeon: Ralene Ok, MD;  Location: [place];  Service: General;  Laterality: N/A;   COLONOSCOPY     COLONOSCOPY WITH PROPOFOL N/A 07/12/2016   Procedure: COLONOSCOPY WITH PROPOFOL;  Surgeon: [name], MD;  Location: [facility];  Service: Endoscopy;  Laterality: N/A;   declotting of fistula  08/2015   ESOPHAGOGASTRODUODENOSCOPY (EGD) WITH PROPOFOL N/A 07/12/2016   Procedure: ESOPHAGOGASTRODUODENOSCOPY (EGD) WITH PROPOFOL;  Surgeon: [name], MD;  Location: [facility];  Service: Endoscopy;  Laterality: N/A;   FISTULA SUPERFICIALIZATION Right 02/07/2016   Procedure: RIIGHT UPPER ARM FISTULA SUPERFICIALIZATION;  Surgeon: Elam Dutch, MD;  Location: [place];  Service: Vascular;  Laterality: Right;   IJ catheter insertion     INSERTION OF DIALYSIS CATHETER Right 02/25/2015   Procedure: INSERTION OF RIGHT INTERNAL JUGULAR DIALYSIS CATHETER;  Surgeon: Serafina Mitchell, MD;  [facility];  Service: Vascular;  Laterality: Right;   KIDNEY TRANSPLANT     LAPAROSCOPIC REPOSITIONING CAPD CATHETER N/A 06/16/2014   Procedure: LAPAROSCOPIC REPOSITIONING CAPD CATHETER;  Surgeon: Ralene Ok, MD;  Location: [place];  Service: General;  Laterality: N/A;   LAPAROSCOPY N/A 05/04/2015   Procedure: LAPAROSCOPY DIAGNOSTIC LYSIS OF ADHESIONS;  Surgeon: Coralie Keens, MD;  Location: [place];  Service: General;  Laterality: N/A;   MINOR REMOVAL OF PERITONEAL DIALYSIS CATHETER N/A 04/28/2015   Procedure:  REMOVAL OF PERITONEAL DIALYSIS CATHETER;  Surgeon: Coralie Keens, MD;  Location: [place];  Service: General;  Laterality: N/A;   NEPHRECTOMY Left 1974   RENAL BIOPSY Right 2012   REVISON OF ARTERIOVENOUS FISTULA Right 05/26/2015   Procedure: SUPERFICIALIZATION OF ARTERIOVENOUS FISTULA WITH SIDE BRANCH LIGATIONS;  Surgeon: Serafina Mitchell, MD;  Location: [place];  Service:  Vascular;  Laterality: Right;   SKIN CANCER EXCISION     right shoulder   SVT ABLATION N/A 11/23/2016   Procedure: SVT Ablation;  Surgeon: Will Meredith Leeds, MD;  Location: [place] [facility];  Service: Cardiovascular;  Laterality: N/A;   UMBILICAL HERNIA REPAIR N/A 05/18/2014   Procedure: HERNIA REPAIR UMBILICAL ADULT;  Surgeon: Ralene Ok, MD;  Location: [place];  Service: General;  Laterality: N/A;   UMBILICAL HERNIA REPAIR N/A 04/28/2015   Procedure: UMBILICAL HERNIA REPAIR WITH MESH;  Surgeon: Coralie Keens, MD;  Location: [place];  Service: General;  Laterality: N/A;    Family History  Problem Relation Age of Onset   Colon polyps Father    Colon cancer Neg Hx    Stomach cancer Neg Hx     Social History:  reports that he quit smoking about 6 years ago. His smoking use included cigars. He has never used smokeless tobacco. He reports current alcohol use. He reports that he does not use drugs.  Review of Systems:   Hypertension:    His blood pressure has been managed by his nephrologist and transplant team Also monitors at home  BP Readings from Last 3 Encounters:  08/06/19 140/80  07/09/19 (!) 149/73  06/11/19 (!) 136/55    Lipids:  His LDL is below 100 with 10 mg Crestor Has no evidence of vascular disease    Lab Results  Component Value Date   CHOL 158 07/07/2019   CHOL 153 04/29/2019   CHOL 166 07/10/2018   Lab Results  Component Value Date   HDL 47 07/07/2019   HDL 54.30 04/29/2019   HDL 54.20 07/10/2018   Lab Results  Component Value Date   LDLCALC 92 07/07/2019   LDLCALC 80 04/29/2019   LDLCALC 95 07/10/2018   Lab Results  Component Value Date   TRIG 93 07/07/2019   TRIG 92.0 04/29/2019   TRIG 86.0 07/10/2018   Lab Results  Component Value Date   CHOLHDL 3 04/29/2019   CHOLHDL 3 07/10/2018   CHOLHDL 3 05/28/2017   No results found for: LDLDIRECT   HYPOTHYROIDISM: He has had long-standing hypothyroidism  Has been taking his 100  g levothyroxine before breakfast daily  He still complains of feeling tired and give out easily.  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 and previously had subjectively improved with supplementation  using Harlene Salts  He was having fatigue and decreased motivation and because of his free testosterone level being low at 3.7 he was prescribed AndroGel in February 2019 His testosterone level has been inconsistent  His testosterone level is now low because of taking his AndroGel inconsistently  Complains of easy fatigability  Lab Results  Component Value Date   TESTOSTERONE 243.59 (L) 08/03/2019   TESTOSTERONE 348.61 04/29/2019   TESTOSTERONE 292.89 (L) 11/12/2018    He has depression but only taking 30 mg of Cymbalta; followed by PCP  Diabetic neuropathy: Has mild sensory loss on his last exam    Examination:   BP 140/80 (BP Location: Left Arm, Patient Position: Sitting, Cuff Size: Large)    Pulse 76    Ht _0  (1.803 m)    Wt 290 lb 9.6 oz (131.8 kg)    SpO2 98%    BMI 40.53 kg/m   Body mass index is 40.53 kg/m.    ASSESSMENT/ PLAN:    Diabetes type 2 with obesity, insulin-requiring:   See history of present illness for detailed discussion of his current management, problems identified and sugar patterns on his meter download  Currently on a regimen of Tresiba, Victoza and mealtime U-500 insulin with syringe  His A1c is fair at 7.2 This is not always accurate because of his renal dysfunction and anemia  Most of his blood sugars at home are higher in the morning and this is related to either inconsistent diet at night or eating snacks during the night without coverage Not clear if he needs to adjust his basal insulin but has a couple of near normal readings in the last  month or so  He is still not motivated to check blood sugars frequently and adjust his insulin based on what he is eating   Recommendations:  Start monitoring blood sugars after dinner regularly at least every other day  Discussed blood sugar target of at least under 180 after eating more 150 at bedtime  If he is eating a snack during the night he will take at least 5 units of insulin to cover this  Increase Victoza to 1.8  He will try to get the Dexcom sensor  Most likely needs higher doses of insulin to cover his morning meal and possibly the evening meal also  He is still not interested in an insulin pump which will otherwise be useful  HYPOGONADISM: He has had therapeutic levels recently, requiring 5 pumps a day on his AndroGel  Level is low because of not being compliant with this  Advised him to start using the AndroGel consistently  Depression: He will consider 60 mg Cymbalta and needs to discuss with PCP This may help improve his compliance with his other problems including diabetes  Hypothyroidism: Has been controlled with normal TSH  Renal dysfunction: Stable and followed by nephrologist  LIPIDS: LDL is below 100 consistently  Patient Instructions  Check blood sugars on waking up days a week  Also check blood sugars about 2 hours after meals and do this after different meals by rotation  Recommended blood sugar levels on waking up are 90-130 and about 2 hours after meal is 130-160  Please bring your blood sugar monitor to each visit, thank you  Take 7-10 units before brunch  Adjust supper dose based on  bedtime sugars  Extra 4-7 for nite snacks   Total visit time for evaluation and management of multiple problems and counseling =25 minutes  Follow-up in 3 months  Elayne Snare 08/06/2019, 10:26 AM

## 2019-08-06 NOTE — Patient Instructions (Addendum)
Check blood sugars on waking up 7 days a week  Also check blood sugars about 2 hours after meals and do this after different meals by rotation  Recommended blood sugar levels on waking up are 90-130 and about 2 hours after meal is 130-160  Please bring your blood sugar monitor to each visit, thank you  Take 7-10 units before brunch  Adjust supper dose based on  bedtime sugars  Extra 4-7 for nite snacks  Victoza 1.8mg  daily  Ask about 60mg  Cymbalta

## 2019-08-07 LAB — CMV DNA, QUANTITATIVE, PCR
CMV DNA Quant: NEGATIVE IU/mL
Log10 CMV Qn DNA Pl: UNDETERMINED log10 IU/mL

## 2019-08-08 LAB — BK QUANT PCR (PLASMA/SERUM)
BK Quantitaion PCR: NEGATIVE copies/mL
Log10 BK Qn PCR: UNDETERMINED log10copy/mL

## 2019-08-13 DIAGNOSIS — R351 Nocturia: Secondary | ICD-10-CM | POA: Diagnosis not present

## 2019-08-13 DIAGNOSIS — Z48816 Encounter for surgical aftercare following surgery on the genitourinary system: Secondary | ICD-10-CM | POA: Diagnosis not present

## 2019-08-13 DIAGNOSIS — R3915 Urgency of urination: Secondary | ICD-10-CM | POA: Diagnosis not present

## 2019-08-13 DIAGNOSIS — N401 Enlarged prostate with lower urinary tract symptoms: Secondary | ICD-10-CM | POA: Diagnosis not present

## 2019-08-13 DIAGNOSIS — R350 Frequency of micturition: Secondary | ICD-10-CM | POA: Diagnosis not present

## 2019-08-13 DIAGNOSIS — N433 Hydrocele, unspecified: Secondary | ICD-10-CM | POA: Diagnosis not present

## 2019-08-13 DIAGNOSIS — Z79899 Other long term (current) drug therapy: Secondary | ICD-10-CM | POA: Diagnosis not present

## 2019-08-13 DIAGNOSIS — R3912 Poor urinary stream: Secondary | ICD-10-CM | POA: Diagnosis not present

## 2019-08-17 ENCOUNTER — Telehealth: Payer: Self-pay | Admitting: Cardiovascular Disease

## 2019-08-17 NOTE — Telephone Encounter (Signed)
° ° ° ° ° ° °  LVM for pt to call an schedule an apppointment with one of Dr Evette Georges APP for November or first available appointment.

## 2019-08-24 DIAGNOSIS — Z940 Kidney transplant status: Secondary | ICD-10-CM | POA: Diagnosis not present

## 2019-08-24 DIAGNOSIS — Z79899 Other long term (current) drug therapy: Secondary | ICD-10-CM | POA: Diagnosis not present

## 2019-08-24 DIAGNOSIS — I10 Essential (primary) hypertension: Secondary | ICD-10-CM | POA: Diagnosis not present

## 2019-09-04 ENCOUNTER — Encounter (HOSPITAL_COMMUNITY): Payer: Medicare Other

## 2019-09-08 ENCOUNTER — Other Ambulatory Visit (HOSPITAL_COMMUNITY): Payer: Self-pay | Admitting: *Deleted

## 2019-09-09 ENCOUNTER — Ambulatory Visit (HOSPITAL_COMMUNITY)
Admission: RE | Admit: 2019-09-09 | Discharge: 2019-09-09 | Disposition: A | Discharge status: Home / Self Care | Payer: Medicare Other | Source: Ambulatory Visit | Attending: Internal Medicine | Admitting: Internal Medicine

## 2019-09-09 ENCOUNTER — Ambulatory Visit (INDEPENDENT_AMBULATORY_CARE_PROVIDER_SITE_OTHER): Payer: Medicare Other | Admitting: Podiatry

## 2019-09-09 ENCOUNTER — Other Ambulatory Visit: Payer: Self-pay

## 2019-09-09 ENCOUNTER — Encounter: Payer: Self-pay | Admitting: Podiatry

## 2019-09-09 ENCOUNTER — Other Ambulatory Visit (HOSPITAL_COMMUNITY): Payer: Self-pay | Admitting: *Deleted

## 2019-09-09 DIAGNOSIS — M79675 Pain in left toe(s): Secondary | ICD-10-CM

## 2019-09-09 DIAGNOSIS — M79674 Pain in right toe(s): Secondary | ICD-10-CM

## 2019-09-09 DIAGNOSIS — B351 Tinea unguium: Secondary | ICD-10-CM | POA: Insufficient documentation

## 2019-09-09 DIAGNOSIS — E1149 Type 2 diabetes mellitus with other diabetic neurological complication: Secondary | ICD-10-CM | POA: Diagnosis not present

## 2019-09-09 DIAGNOSIS — M205X1 Other deformities of toe(s) (acquired), right foot: Secondary | ICD-10-CM | POA: Insufficient documentation

## 2019-09-09 DIAGNOSIS — M205X2 Other deformities of toe(s) (acquired), left foot: Secondary | ICD-10-CM | POA: Diagnosis not present

## 2019-09-09 LAB — URINALYSIS, COMPLETE (UACMP) WITH MICROSCOPIC
Bacteria, UA: NONE SEEN
Bilirubin Urine: NEGATIVE
Glucose, UA: >=500 mg/dL — AB
Ketones, ur: NEGATIVE mg/dL
Leukocytes,Ua: NEGATIVE
Nitrite: NEGATIVE
Protein, ur: 100 mg/dL — AB
Specific Gravity, Urine: 1.021 (ref 1.005–1.030)
pH: 6 (ref 5.0–8.0)

## 2019-09-09 LAB — CBC
HCT: 43.5 % (ref 39.0–52.0)
Hemoglobin: 14.2 g/dL (ref 13.0–17.0)
MCH: 28.1 pg (ref 26.0–34.0)
MCHC: 32.6 g/dL (ref 30.0–36.0)
MCV: 86 fL (ref 80.0–100.0)
Platelets: 240 10*3/uL (ref 150–400)
RBC: 5.06 MIL/uL (ref 4.22–5.81)
RDW: 13.5 % (ref 11.5–15.5)
WBC: 8.9 10*3/uL (ref 4.0–10.5)
nRBC: 0 % (ref 0.0–0.2)

## 2019-09-09 LAB — PROTEIN / CREATININE RATIO, URINE
Creatinine, Urine: 81.57 mg/dL
Protein Creatinine Ratio: 2.61 mg/mg{Cre} — ABNORMAL HIGH (ref 0.00–0.15)
Total Protein, Urine: 213 mg/dL

## 2019-09-09 LAB — BASIC METABOLIC PANEL
Anion gap: 10 (ref 5–15)
BUN: 25 mg/dL — ABNORMAL HIGH (ref 6–20)
CO2: 22 mmol/L (ref 22–32)
Calcium: 9.5 mg/dL (ref 8.9–10.3)
Chloride: 105 mmol/L (ref 98–111)
Creatinine, Ser: 1.76 mg/dL — ABNORMAL HIGH (ref 0.61–1.24)
GFR calc Af Amer: 48 mL/min — ABNORMAL LOW (ref >60)
GFR calc non Af Amer: 41 mL/min — ABNORMAL LOW (ref >60)
Glucose, Bld: 287 mg/dL — ABNORMAL HIGH (ref 70–99)
Potassium: 4.4 mmol/L (ref 3.5–5.1)
Sodium: 137 mmol/L (ref 135–145)

## 2019-09-09 MED ORDER — SODIUM CHLORIDE 0.9 % IV SOLN
5.0000 mg/kg | INTRAVENOUS | Status: DC
  Filled 2019-09-09: qty 662.5

## 2019-09-09 MED ORDER — SODIUM CHLORIDE 0.9 % IV SOLN
5.0000 mg/kg | INTRAVENOUS | Status: DC
  Administered 2019-09-09: 662.5 mg via INTRAVENOUS
  Filled 2019-09-09: qty 662.5

## 2019-09-09 NOTE — Progress Notes (Signed)
Patient ID: Robert [name], male   DOB: 1960-12-16, 58 y.o.   MRN: [id number] Complaint:  Visit Type: Patient returns to my office for continued preventative foot care services. Complaint: Patient states" my nails have grown long and thick and become painful to walk and wear shoes" Patient has been diagnosed with DM and had a kidney transplant right side.  This is leading to increased leg swelling left.  . The patient presents for preventative foot care services. No changes to ROS  Podiatric Exam: Vascular: dorsalis pedis and posterior tibial pulses are palpable bilateral. Capillary return is immediate. Temperature gradient is WNL. Skin turgor WNL  Sensorium: Diminished  Semmes Weinstein monofilament test. Normal tactile sensation bilaterally. Nail Exam: Pt has thick disfigured discolored nails with subungual debris noted bilateral entire nail hallux through fifth toenails Ulcer Exam: There is no evidence of ulcer or pre-ulcerative changes or infection. Orthopedic Exam: Muscle tone and strength are WNL. No limitations in general ROM. No crepitus or effusions noted. Foot type and digits show no abnormalities. Bony prominences are unremarkable. HAV  B/L Skin: No Porokeratosis. No infection or ulcers  Diagnosis:  Onychomycosis, , Pain in right toe, pain in left toes, Diabetic neuropathy  Hallux limitus 1st MPJ  B/L  Treatment & Plan Procedures and Treatment: Consent by patient was obtained for treatment procedures. The patient understood the discussion of treatment and procedures well. All questions were answered thoroughly reviewed. Debridement of mycotic and hypertrophic toenails, 1 through 5 bilateral and clearing of subungual debris. No ulceration, no infection noted.  Return Visit-Office Procedure: Patient instructed to return to the office for a follow up visit 3 months for continued evaluation and treatment.    Gardiner Barefoot DPM

## 2019-09-10 LAB — CMV DNA, QUANTITATIVE, PCR
CMV DNA Quant: NEGATIVE IU/mL
Log10 CMV Qn DNA Pl: UNDETERMINED log10 IU/mL

## 2019-09-13 ENCOUNTER — Other Ambulatory Visit: Payer: Self-pay | Admitting: Endocrinology

## 2019-09-13 ENCOUNTER — Other Ambulatory Visit: Payer: Self-pay | Admitting: Cardiovascular Disease

## 2019-09-15 NOTE — Progress Notes (Signed)
Called and left Mr Robert [name] a message regarding his appt on 10/07/19.  Pt did not answer.   I left a message for him to call us back.

## 2019-09-16 LAB — BK QUANT PCR (PLASMA/SERUM)
BK Quantitaion PCR: NEGATIVE copies/mL
Log10 BK Qn PCR: UNDETERMINED log10copy/mL

## 2019-09-19 ENCOUNTER — Other Ambulatory Visit: Payer: Self-pay | Admitting: Endocrinology

## 2019-09-29 ENCOUNTER — Other Ambulatory Visit: Payer: Medicare Other

## 2019-09-30 ENCOUNTER — Ambulatory Visit (INDEPENDENT_AMBULATORY_CARE_PROVIDER_SITE_OTHER): Payer: Medicare Other | Admitting: Endocrinology

## 2019-09-30 ENCOUNTER — Other Ambulatory Visit: Payer: Self-pay

## 2019-09-30 ENCOUNTER — Encounter: Payer: Self-pay | Admitting: Endocrinology

## 2019-09-30 DIAGNOSIS — E782 Mixed hyperlipidemia: Secondary | ICD-10-CM

## 2019-09-30 DIAGNOSIS — E291 Testicular hypofunction: Secondary | ICD-10-CM | POA: Diagnosis not present

## 2019-09-30 DIAGNOSIS — E1142 Type 2 diabetes mellitus with diabetic polyneuropathy: Secondary | ICD-10-CM | POA: Diagnosis not present

## 2019-09-30 DIAGNOSIS — E039 Hypothyroidism, unspecified: Secondary | ICD-10-CM

## 2019-09-30 DIAGNOSIS — Z794 Long term (current) use of insulin: Secondary | ICD-10-CM | POA: Diagnosis not present

## 2019-09-30 DIAGNOSIS — E1165 Type 2 diabetes mellitus with hyperglycemia: Secondary | ICD-10-CM

## 2019-09-30 NOTE — Progress Notes (Signed)
Patient ID: Robert [name], male   DOB: 06-05-1961, 58 y.o.   MRN: [id number]   I connected with the above-named patient by video enabled telemedicine application and verified that I am speaking with the correct person. The patient was explained the limitations of evaluation and management by telemedicine and the availability of in person appointments.  Patient also understood that there may be a patient responsible charge related to this service . Location of the patient: Patient's home . Location of the provider: Physician office Only the patient and myself were participating in the encounter The patient understood the above statements and agreed to proceed.    Reason for Appointment: Endocrinology follow-up  History of Present Illness   DIABETES diagnosis date:  2012  Previous history: His blood sugar has been difficult to control since onset. Initially was taking oral hypoglycemic drugs like glyburide but could not take metformin because of renal dysfunction. Since Victoza did not help his control he was started on Lantus and then U 500 insulin also His A1c has previously ranged from 7.8-9.8, the lowest level in 03/2013 He was on Victoza but because of his markedly decreased appetite and weight loss this was stopped in 9/15  Recent history:   Insulin regimen: Tresiba 41 units daily., Humulin U-500 before meals 15 units Previously following 1: 5 carbohydrate ratio  Non-hypoglycemic insulin regimen: Victoza 1.8 mg daily  His A1c is still relatively high at 7.2  Highest was 7.8 done in October Fructosamine previously was 281, generally about the same, no recent labs available  Current blood sugar patterns, management and problems identified:    His blood sugars appear to be much higher than before with review of his home readings  He thinks that this is from irregular use of his insulin as well as poor diet  He is still not motivated to check his sugars and has  only 7 readings in the last month or so  He would probably does take his [name] regularly but has not change it even with his fasting blood sugars as high as 499  He is trying to take his Humulin R insulin but frequently missing the doses, also he does not want to increase the dose over 15 units because he thinks that will cause low sugars, however the lowest blood sugar recently is only 215  Lab glucose earlier this month was 287  Also has not checked his sugar 4 times a day as recommended to be eligible for a CGM such as Dexcom  Also cannot afford co-pay for the OmniPod pump  His weight appears to be about the same recently   Proper timing of medications in relation to meals: Yes.          Monitors blood glucose:  recently 1-2 times a day .    Glucometer: Accu-Chek         Blood Glucose readings by meter reviewed   PRE-MEAL Fasting Lunch Dinner Bedtime Overall  Glucose range:  278-499   331    Mean/median:     ?   POST-MEAL PC Breakfast PC Lunch  8-10 PM  Glucose range:    215, 313  Mean/median:      Previous sugars:  PRE-MEAL Fasting Lunch  PC lunch Bedtime Overall  Glucose range: 107-285  203-317  165    Mean/median:     212     Meals: 2-3 meals per day. 7-8 am and Noon and [employer] 5-7 PM .  Physical activity: exercise: yard work       Weight control: No  Wt Readings from Last 3 Encounters:  09/09/19 290 lb 9.6 oz (131.8 kg)  08/06/19 291 lb 9.6 oz (132.3 kg)  08/06/19 290 lb 9.6 oz (923.3 kg)        Complications:   nephropathy, neuropathy   Diabetes labs:   Lab Results  Component Value Date   HGBA1C 7.2 (H) 08/03/2019   HGBA1C 6.4 07/07/2019   HGBA1C 7.4 (H) 04/29/2019   Lab Results  Component Value Date   MICROALBUR 146.3 (H) 08/03/2019   LDLCALC 92 07/07/2019   CREATININE 1.76 (H) 09/09/2019    Lab Results  Component Value Date   FRUCTOSAMINE 281 04/29/2019   FRUCTOSAMINE 278 09/17/2018   FRUCTOSAMINE 284 05/08/2018     No visits  with results within 1 Week(s) from this visit.  Latest known visit with results is:  Hospital Outpatient Visit on 09/09/2019  Component Date Value Ref Range Status  . Sodium 09/09/2019 137  135 - 145 mmol/L Final  . Potassium 09/09/2019 4.4  3.5 - 5.1 mmol/L Final  . Chloride 09/09/2019 105  98 - 111 mmol/L Final  . CO2 09/09/2019 22  22 - 32 mmol/L Final  . Glucose, Bld 09/09/2019 287* 70 - 99 mg/dL Final  . BUN 09/09/2019 25* 6 - 20 mg/dL Final  . Creatinine, Ser 09/09/2019 1.76* 0.61 - 1.24 mg/dL Final  . Calcium 09/09/2019 9.5  8.9 - 10.3 mg/dL Final  . GFR calc non Af Amer 09/09/2019 41* >60 mL/min Final  . GFR calc Af Amer 09/09/2019 48* >60 mL/min Final  . Anion gap 09/09/2019 10  5 - 15 Final   Performed at [facility], [place] [address] [place], [place] [address]  . BK Quantitaion PCR 09/09/2019 Negative  Negative copies/mL Final   Comment: (NOTE) No BK DNA detected. The quantitative range of this assay is 200 to 10 million copies/mL. This test was developed and its performance characteristics determined by [facility].  It has not been cleared or approved by the Food and Drug Administration.  The FDA has determined that such clearance or approval is not necessary. Performed At: [facility] [place], [place] [id number] Rush Farmer MD YB:[id number]   . Log10 BK Qn PCR 09/09/2019 UNABLE TO CALCULATE  log10copy/mL Final   Comment: (NOTE) Unable to calculate result since non-numeric result obtained for component test.   . WBC 09/09/2019 8.9  4.0 - 10.5 K/uL Final  . RBC 09/09/2019 5.06  4.22 - 5.81 MIL/uL Final  . Hemoglobin 09/09/2019 14.2  13.0 - 17.0 g/dL Final  . HCT 09/09/2019 43.5  39.0 - 52.0 % Final  . MCV 09/09/2019 86.0  80.0 - 100.0 fL Final  . MCH 09/09/2019 28.1  26.0 - 34.0 pg Final  . MCHC 09/09/2019 32.6  30.0 - 36.0 g/dL Final  . RDW 09/09/2019 13.5  11.5 - 15.5 % Final  . Platelets 09/09/2019 240  150 - 400 K/uL Final  . nRBC  09/09/2019 0.0  0.0 - 0.2 % Final   Performed at [facility], [place] [address] [place], [place] [address]  . CMV DNA Quant 09/09/2019 Negative  Negative IU/mL Final   Comment: (NOTE) No CMV DNA detected. The quantitative range of this assay is 200 to 1 million IU/mL. This test was developed and its performance characteristics determined by [facility]. It has not been cleared or approved by the Food and Drug  Administration. The FDA has determined that such clearance or approval is not necessary. Performed At: [facility] [place], [place] [id number] Rush Farmer MD JS:[id number]   . Log10 CMV Qn DNA Pl 09/09/2019 UNABLE TO CALCULATE  log10 IU/mL Final   Comment: (NOTE) Unable to calculate result since non-numeric result obtained for component test.   . Creatinine, Urine 09/09/2019 81.57  mg/dL Final  . Total Protein, Urine 09/09/2019 213  mg/dL Final   Comment: NO NORMAL RANGE ESTABLISHED FOR THIS TEST RESULTS CONFIRMED BY MANUAL DILUTION   . Protein Creatinine Ratio 09/09/2019 2.61* 0.00 - 0.15 mg/mg[Cre] Final   Performed at [facility], [place] [address], [place], [place] [address]  . Color, Urine 09/09/2019 YELLOW  YELLOW Final  . APPearance 09/09/2019 CLEAR  CLEAR Final  . Specific Gravity, Urine 09/09/2019 1.021  1.005 - 1.030 Final  . pH 09/09/2019 6.0  5.0 - 8.0 Final  . Glucose, UA 09/09/2019 >=500* NEGATIVE mg/dL Final  . Hgb urine dipstick 09/09/2019 MODERATE* NEGATIVE Final  . Bilirubin Urine 09/09/2019 NEGATIVE  NEGATIVE Final  . Ketones, ur 09/09/2019 NEGATIVE  NEGATIVE mg/dL Final  . Protein, ur 09/09/2019 100* NEGATIVE mg/dL Final  . Nitrite 09/09/2019 NEGATIVE  NEGATIVE Final  . Chalmers Guest 09/09/2019 NEGATIVE  NEGATIVE Final  . RBC / HPF 09/09/2019 21-50  0 - 5 RBC/hpf Final  . WBC, UA 09/09/2019 6-10  0 - 5 WBC/hpf Final  . Bacteria, UA 09/09/2019 NONE SEEN  NONE SEEN Final   Performed at [facility], [place] [address], [place], [place] [address]    OTHER problems: See review of systems    Allergies as of 09/30/2019   No Known Allergies     Medication List       Accurate as of September 30, 2019  8:32 AM. If you have any questions, ask your nurse or doctor.        Accu-Chek [employer] Kit 1 each by Does not apply route daily.   Accu-Chek [employer] Lancets Misc Use lancets to check blood sugar 5 times daily.   amLODipine 5 MG tablet Commonly known as: NORVASC Take 5 mg by mouth daily.   aspirin EC 81 MG tablet Take 81 mg by mouth daily.   B-D UF III MINI PEN NEEDLES 31G X 5 MM Misc Generic drug: Insulin Pen Needle USE 4 PER DAY TO INJECT INSULIN.   DULoxetine 30 MG capsule Commonly known as: CYMBALTA Take 30 mg by mouth daily.   Fifty50 Glucose Meter 2.0 w/Device Kit Use as directed   Accu-Chek Guide w/Device Kit 1 each by Does not apply route daily. Use meter to check blood sugar 5 times daily.   fluocinonide cream 0.05 % Commonly known as: LIDEX Apply 1 application topically daily as needed (Skin inflammation (hand, thigh, back)).   furosemide 20 MG tablet Commonly known as: LASIX Take 40 mg by mouth daily as needed for edema.   gabapentin 100 MG capsule Commonly known as: NEURONTIN Take 200-300 mg by mouth See admin instructions. 200 mg in the morning and 300 mg at bedtime   glucose blood test strip Commonly known as: Accu-Chek Guide 1 each by Other route as needed for other. Use as instructed to check blood sugar 5 times daily.   HUMULIN R 500 UNIT/ML injection Generic drug: insulin regular human CONCENTRATED Using a U-100 1cc/31G/5/16" INSULIN SYRINGE, draw up and inject 5-18 units into the skin three times a day 30 minutes prior  to each meal   Insulin Syringe-Needle U-100 31G X 5/16" 0.3 ML Misc Commonly known as: BD Insulin Syringe U/F Use to inject insulin 2-3 times per day   BD Insulin Syringe U/F 31G X 5/16" 1 ML Misc Generic drug: Insulin  Syringe-Needle U-100 USE 3 TIMES A DAY WITH HUMULIN R   lactulose 10 GM/15ML solution Commonly known as: CHRONULAC Take 10 g by mouth daily as needed.   levothyroxine 100 MCG tablet Commonly known as: SYNTHROID TAKE 1 TABLET BY MOUTH EVERY DAY   lisinopril 20 MG tablet Commonly known as: ZESTRIL Take 20 mg by mouth 2 (two) times a day. Take 1 tablet (20m total) by mouth twice daily.   metoprolol tartrate 50 MG tablet Commonly known as: LOPRESSOR TAKE 1 TABLET BY MOUTH TWICE A DAY   mycophenolate 250 MG capsule Commonly known as: CELLCEPT Take 1,000 mg by mouth every 12 (twelve) hours.   Nulojix 250 MG Solr injection Generic drug: belatacept Inject 24.5 mLs into the vein every 28 (twenty-eight) days.   predniSONE 5 MG tablet Commonly known as: DELTASONE Take 5 mg by mouth daily with breakfast.   rosuvastatin 10 MG tablet Commonly known as: CRESTOR TAKE 1 TABLET BY MOUTH EVERY DAY   senna-docusate 8.6-50 MG tablet Commonly known as: Senokot-S Take 1 tablet by mouth as needed for mild constipation.   tamsulosin 0.4 MG Caps capsule Commonly known as: FLOMAX Take 0.8 mg by mouth at bedtime.   Testosterone 20.25 MG/ACT (1.62%) Gel APPLY 3 PUMPS ON ONE ARM AND 2 ON THE OTHER EVERY MORNING ON SHOULDER AREA   traMADol 50 MG tablet Commonly known as: ULTRAM Take 1 tablet (50 mg total) by mouth every 6 (six) hours as needed. What changed:   how much to take  reasons to take this   traZODone 50 MG tablet Commonly known as: DESYREL Take 50-100 mg by mouth at bedtime as needed for sleep.   TTyler AasFlexTouch 100 UNIT/ML Sopn FlexTouch Pen Generic drug: insulin degludec INJECT 38 UNITS UNDER THE SKIN ONCE DAILY. **PT MUST BE SEEN FOR FUTURE REFILLS.** What changed: See the new instructions.   UNABLE TO FIND BiPAP: At bedtime   Victoza 18 MG/3ML Sopn Generic drug: liraglutide Inject 0.3 mLs (1.8 mg total) into the skin daily.   zolpidem 10 MG tablet Commonly  known as: AMBIEN Take 10-20 mg by mouth at bedtime as needed for sleep.       Allergies: No Known Allergies  Past Medical History:  Diagnosis Date  . Anemia    low hgb at present  . Arthritis    FEET  . Blood transfusion without reported diagnosis 1974   kidney removed - L  . Cancer ([place]    skin ca right shoulder, plastic dsyplasia, pre-Ca polpys removed on Colonoscopy- 07/2014  . Colon polyps 07/23/2014   Tubular adenomas x 5  . Constipation   . Diabetes mellitus without complication (HCC)    Type 2  . Difficult intubation    unsure of actual problem but it was during the January 28, 2015 procedure.  .Marland KitchenDysrhythmia   . Eczema   . ESRD (end stage renal disease) ([place]    hEMO mwf  . Headache(784.0)    slight dull h/a due kidney failure  . Hyperlipidemia   . Hypertension    SVT h/o , followed by Dr. KClaiborne Billings . Hypothyroidism   . Macular degeneration   . Neuropathy    right hand and both feet  .  PAT (paroxysmal atrial tachycardia) ([place])   . PSVT (paroxysmal supraventricular tachycardia) ([place])   . SBO (small bowel obstruction) ([place])   . Shortness of breath    with exertion  . Sleep apnea 06/18/11   split-[facility] BiPAP- 13-16  . Thyroid disease   . Umbilical hernia    will repair with thi surgery    Past Surgical History:  Procedure Laterality Date  . AV FISTULA PLACEMENT Right 02/25/2015   Procedure: RIGHT ARTERIOVENOUS (AV) FISTULA CREATION;  Surgeon: Serafina [place], MD;  Location: [facility];  Service: Vascular;  Laterality: Right;  . AV FISTULA PLACEMENT Right 09/15/2015   Procedure: ARTERIOVENOUS (AV) FISTULA CREATION- RIGHT ARM;  Surgeon: Serafina [place], MD;  Location: [facility];  Service: Vascular;  Laterality: Right;  . CAPD INSERTION N/A 05/18/2014   Procedure: LAPAROSCOPIC INSERTION CONTINUOUS AMBULATORY PERITONEAL DIALYSIS  (CAPD) CATHETER;  Surgeon: Ralene Ok, MD;  Location: [facility];  Service: General;  Laterality:  N/A;  . COLONOSCOPY    . COLONOSCOPY WITH PROPOFOL N/A 07/12/2016   Procedure: COLONOSCOPY WITH PROPOFOL;  Surgeon: [name], MD;  [facility];  Service: Endoscopy;  Laterality: N/A;  . declotting of fistula  08/2015  . ESOPHAGOGASTRODUODENOSCOPY (EGD) WITH PROPOFOL N/A 07/12/2016   Procedure: ESOPHAGOGASTRODUODENOSCOPY (EGD) WITH PROPOFOL;  Surgeon: [name], MD;  [facility];  Service: Endoscopy;  Laterality: N/A;  . FISTULA SUPERFICIALIZATION Right 02/07/2016   Procedure: RIIGHT UPPER ARM FISTULA SUPERFICIALIZATION;  Surgeon: Elam Dutch, MD;  Location: [facility];  Service: Vascular;  Laterality: Right;  . IJ catheter insertion    . INSERTION OF DIALYSIS CATHETER Right 02/25/2015   Procedure: INSERTION OF RIGHT INTERNAL JUGULAR DIALYSIS CATHETER;  Surgeon: Serafina [place], MD;  Location: [facility];  Service: Vascular;  Laterality: Right;  . KIDNEY TRANSPLANT    . LAPAROSCOPIC REPOSITIONING CAPD CATHETER N/A 06/16/2014   Procedure: LAPAROSCOPIC REPOSITIONING CAPD CATHETER;  Surgeon: Ralene Ok, MD;  Location: [facility];  Service: General;  Laterality: N/A;  . LAPAROSCOPY N/A 05/04/2015   Procedure: LAPAROSCOPY DIAGNOSTIC LYSIS OF ADHESIONS;  Surgeon: Coralie Keens, MD;  Location: [facility];  Service: General;  Laterality: N/A;  . MINOR REMOVAL OF PERITONEAL DIALYSIS CATHETER N/A 04/28/2015   Procedure:  REMOVAL OF PERITONEAL DIALYSIS CATHETER;  Surgeon: Coralie Keens, MD;  Location: [facility];  Service: General;  Laterality: N/A;  . NEPHRECTOMY Left 1974  . RENAL BIOPSY Right 2012  . REVISON OF ARTERIOVENOUS FISTULA Right 05/26/2015   Procedure: SUPERFICIALIZATION OF ARTERIOVENOUS FISTULA WITH SIDE BRANCH LIGATIONS;  Surgeon: Serafina [place], MD;  Location: [facility];  Service: Vascular;  Laterality: Right;  . SKIN CANCER EXCISION     right shoulder  . SVT ABLATION N/A 11/23/2016   Procedure: SVT Ablation;  Surgeon: Will Meredith [place], MD;  Location: [facility];   Service: Cardiovascular;  Laterality: N/A;  . UMBILICAL HERNIA REPAIR N/A 05/18/2014   Procedure: HERNIA REPAIR UMBILICAL ADULT;  Surgeon: Ralene Ok, MD;  Location: [facility];  Service: General;  Laterality: N/A;  . UMBILICAL HERNIA REPAIR N/A 04/28/2015   Procedure: UMBILICAL HERNIA REPAIR WITH MESH;  Surgeon: Coralie Keens, MD;  [facility];  Service: General;  Laterality: N/A;    Family History  Problem Relation Age of Onset  . Colon polyps Father   . Colon cancer Neg Hx   . Stomach cancer Neg Hx     Social History:  reports that he  quit smoking about 6 years ago. His smoking use included cigars. He has never used smokeless tobacco. He reports current alcohol use. He reports that he does not use drugs.  Review of Systems:   Hypertension:    His blood pressure has been managed by his nephrologist and transplant team Also monitors BP at home  BP Readings from Last 3 Encounters:  09/09/19 (!) 150/68  08/06/19 138/73  08/06/19 140/80    Lipids:  His LDL is below 100 with 10 mg Crestor Has no evidence of vascular disease    Lab Results  Component Value Date   CHOL 158 07/07/2019   CHOL 153 04/29/2019   CHOL 166 07/10/2018   Lab Results  Component Value Date   HDL 47 07/07/2019   HDL 54.30 04/29/2019   HDL 54.20 07/10/2018   Lab Results  Component Value Date   LDLCALC 92 07/07/2019   LDLCALC 80 04/29/2019   LDLCALC 95 07/10/2018   Lab Results  Component Value Date   TRIG 93 07/07/2019   TRIG 92.0 04/29/2019   TRIG 86.0 07/10/2018   Lab Results  Component Value Date   CHOLHDL 3 04/29/2019   CHOLHDL 3 07/10/2018   CHOLHDL 3 05/28/2017   No results found for: LDLDIRECT   HYPOTHYROIDISM: He has had long-standing hypothyroidism  Has been taking his 100 g levothyroxine before breakfast daily  Has not needed any adjustment of his dose for some time  TSH normal  Lab Results  Component Value Date   TSH 1.80 08/03/2019       HYPOGONADISM: He  previously has had hypogonadotropic hypogonadism related to his metabolic syndrome and previously had subjectively improved with supplementation using Fortesta  He was having fatigue and decreased motivation and because of his free testosterone level being low at 3.7 he was prescribed AndroGel in February 2019 His testosterone level has been inconsistent  His testosterone level is most recently low because of taking his AndroGel inconsistently  Still complains of tiredness He says he is trying to take his AndroGel more regularly but is not able to take it every single day  Lab Results  Component Value Date   TESTOSTERONE 243.59 (L) 08/03/2019   TESTOSTERONE 348.61 04/29/2019   TESTOSTERONE 292.89 (L) 11/12/2018    He has depression but only taking 30 mg of Cymbalta; followed by PCP  Diabetic neuropathy: Has mild sensory loss on his last exam    Examination:   There were no vitals taken for this visit.  There is no height or weight on file to calculate BMI.    ASSESSMENT/ PLAN:    Diabetes type 2 with obesity, insulin-requiring:   See history of present illness for detailed discussion of his current management, problems identified and sugar patterns on his meter download  Currently on a regimen of Tresiba, Victoza and mealtime U-500 insulin with syringe  No recent labs available to assess his level of control  His blood sugars are being checked infrequently and are as high as 499 He is not taking his mealtime insulin regularly and also going off his diet Still not active at all and not losing weight Currently not motivated to improve his daily routine He appears to be getting more insulin resistant also  Although he may benefit from using a CGM and OmniPod insulin pump he either does not qualify or cannot afford these   Recommendations:  Start monitoring blood sugars at least once or twice a day and preferably 4 times a day  Increase Tresiba to 54  He will go up  another 4 units if blood sugars are still over 150 after 4 days  We will switch him to U-200 [name]  Discussed blood sugar targets of under 180 after meals and under 130 before breakfast  If his blood sugars are over 180 after meals he will need to at least go 2 to 4 units more on his Humulin R before his meals  Encouraged him to be better with his diet with cutting back on carbohydrates, portions and high-fat meals  Stay on 1.8 mg Victoza  Needs labs including A1c and fructosamine on the next visit, A1c appears to be falsely low in the past    HYPOGONADISM: He has tried to be a little more regular with his AndroGel but not taking this daily Last level was low because of forgetting to do it daily Encouraged him to take it consistently every day, this may improve his overall energy level also  Depression: Appears not controlled with low motivation, May benefit from 60 mg Cymbalta, he still has not discussed this with his PCP   Hypothyroidism: Has been controlled with normal TSH, recheck on the next visit  There are no Patient Instructions on file for this visit.  Follow-up in 2 months Elayne Snare 09/30/2019, 8:32 AM   Total visit time for evaluation and management of multiple problems and counseling =25 minutes

## 2019-10-01 ENCOUNTER — Other Ambulatory Visit (HOSPITAL_COMMUNITY): Payer: Self-pay | Admitting: *Deleted

## 2019-10-05 ENCOUNTER — Ambulatory Visit (HOSPITAL_COMMUNITY)
Admission: RE | Admit: 2019-10-05 | Discharge: 2019-10-05 | Disposition: A | Discharge status: Home / Self Care | Payer: Medicare Other | Source: Ambulatory Visit | Attending: Internal Medicine | Admitting: Internal Medicine

## 2019-10-05 ENCOUNTER — Other Ambulatory Visit: Payer: Self-pay

## 2019-10-05 DIAGNOSIS — Z940 Kidney transplant status: Secondary | ICD-10-CM | POA: Diagnosis not present

## 2019-10-05 LAB — URINALYSIS, COMPLETE (UACMP) WITH MICROSCOPIC
Bilirubin Urine: NEGATIVE
Glucose, UA: >=500 mg/dL — AB
Ketones, ur: NEGATIVE mg/dL
Leukocytes,Ua: NEGATIVE
Nitrite: NEGATIVE
Protein, ur: 100 mg/dL — AB
RBC / HPF: >50 RBC/hpf — ABNORMAL HIGH (ref 0–5)
Specific Gravity, Urine: 1.02 (ref 1.005–1.030)
pH: 5 (ref 5.0–8.0)

## 2019-10-05 LAB — CBC
HCT: 44.5 % (ref 39.0–52.0)
Hemoglobin: 15 g/dL (ref 13.0–17.0)
MCH: 28.8 pg (ref 26.0–34.0)
MCHC: 33.7 g/dL (ref 30.0–36.0)
MCV: 85.4 fL (ref 80.0–100.0)
Platelets: 212 10*3/uL (ref 150–400)
RBC: 5.21 MIL/uL (ref 4.22–5.81)
RDW: 13.8 % (ref 11.5–15.5)
WBC: 6.2 10*3/uL (ref 4.0–10.5)
nRBC: 0 % (ref 0.0–0.2)

## 2019-10-05 LAB — BASIC METABOLIC PANEL
Anion gap: 10 (ref 5–15)
BUN: 27 mg/dL — ABNORMAL HIGH (ref 6–20)
CO2: 24 mmol/L (ref 22–32)
Calcium: 9.9 mg/dL (ref 8.9–10.3)
Chloride: 103 mmol/L (ref 98–111)
Creatinine, Ser: 1.86 mg/dL — ABNORMAL HIGH (ref 0.61–1.24)
GFR calc Af Amer: 45 mL/min — ABNORMAL LOW (ref >60)
GFR calc non Af Amer: 38 mL/min — ABNORMAL LOW (ref >60)
Glucose, Bld: 292 mg/dL — ABNORMAL HIGH (ref 70–99)
Potassium: 4 mmol/L (ref 3.5–5.1)
Sodium: 137 mmol/L (ref 135–145)

## 2019-10-05 LAB — PROTEIN / CREATININE RATIO, URINE
Creatinine, Urine: 103.11 mg/dL
Protein Creatinine Ratio: 2.5 mg/mg{Cre} — ABNORMAL HIGH (ref 0.00–0.15)
Total Protein, Urine: 258 mg/dL

## 2019-10-05 MED ORDER — SODIUM CHLORIDE 0.9 % IV SOLN
5.0000 mg/kg | INTRAVENOUS | Status: DC
  Administered 2019-10-05: 662.5 mg via INTRAVENOUS
  Filled 2019-10-05: qty 662.5

## 2019-10-07 ENCOUNTER — Ambulatory Visit: Payer: Medicare Other | Admitting: Endocrinology

## 2019-10-07 ENCOUNTER — Encounter (HOSPITAL_COMMUNITY): Payer: Medicare Other

## 2019-10-07 LAB — CMV DNA, QUANTITATIVE, PCR
CMV DNA Quant: NEGATIVE IU/mL
Log10 CMV Qn DNA Pl: UNDETERMINED log10 IU/mL

## 2019-10-07 LAB — BK QUANT PCR (PLASMA/SERUM)
BK Quantitaion PCR: NEGATIVE copies/mL
Log10 BK Qn PCR: UNDETERMINED log10copy/mL

## 2019-10-29 ENCOUNTER — Ambulatory Visit: Payer: Medicare Other | Admitting: Cardiovascular Disease

## 2019-11-02 ENCOUNTER — Encounter (HOSPITAL_COMMUNITY): Payer: Medicare Other

## 2019-11-04 ENCOUNTER — Other Ambulatory Visit (HOSPITAL_COMMUNITY): Payer: Self-pay | Admitting: *Deleted

## 2019-11-05 ENCOUNTER — Ambulatory Visit (HOSPITAL_COMMUNITY)
Admission: RE | Admit: 2019-11-05 | Discharge: 2019-11-05 | Disposition: A | Discharge status: Home / Self Care | Payer: Medicare Other | Source: Ambulatory Visit | Attending: Internal Medicine | Admitting: Internal Medicine

## 2019-11-05 ENCOUNTER — Other Ambulatory Visit: Payer: Self-pay

## 2019-11-05 DIAGNOSIS — Z79899 Other long term (current) drug therapy: Secondary | ICD-10-CM | POA: Insufficient documentation

## 2019-11-05 DIAGNOSIS — Z940 Kidney transplant status: Secondary | ICD-10-CM | POA: Diagnosis not present

## 2019-11-05 DIAGNOSIS — Z5181 Encounter for therapeutic drug level monitoring: Secondary | ICD-10-CM | POA: Insufficient documentation

## 2019-11-05 LAB — CBC
HCT: 45.1 % (ref 39.0–52.0)
Hemoglobin: 14.6 g/dL (ref 13.0–17.0)
MCH: 28 pg (ref 26.0–34.0)
MCHC: 32.4 g/dL (ref 30.0–36.0)
MCV: 86.4 fL (ref 80.0–100.0)
Platelets: 256 10*3/uL (ref 150–400)
RBC: 5.22 MIL/uL (ref 4.22–5.81)
RDW: 13.9 % (ref 11.5–15.5)
WBC: 6.7 10*3/uL (ref 4.0–10.5)
nRBC: 0 % (ref 0.0–0.2)

## 2019-11-05 LAB — BASIC METABOLIC PANEL
Anion gap: 8 (ref 5–15)
BUN: 29 mg/dL — ABNORMAL HIGH (ref 6–20)
CO2: 23 mmol/L (ref 22–32)
Calcium: 9.8 mg/dL (ref 8.9–10.3)
Chloride: 105 mmol/L (ref 98–111)
Creatinine, Ser: 1.74 mg/dL — ABNORMAL HIGH (ref 0.61–1.24)
GFR calc Af Amer: 48 mL/min — ABNORMAL LOW (ref >60)
GFR calc non Af Amer: 42 mL/min — ABNORMAL LOW (ref >60)
Glucose, Bld: 202 mg/dL — ABNORMAL HIGH (ref 70–99)
Potassium: 4.1 mmol/L (ref 3.5–5.1)
Sodium: 136 mmol/L (ref 135–145)

## 2019-11-05 LAB — URINALYSIS, COMPLETE (UACMP) WITH MICROSCOPIC
Bacteria, UA: NONE SEEN
Bilirubin Urine: NEGATIVE
Glucose, UA: >=500 mg/dL — AB
Ketones, ur: NEGATIVE mg/dL
Leukocytes,Ua: NEGATIVE
Nitrite: NEGATIVE
Protein, ur: 100 mg/dL — AB
Specific Gravity, Urine: 1.017 (ref 1.005–1.030)
pH: 6 (ref 5.0–8.0)

## 2019-11-05 LAB — PROTEIN / CREATININE RATIO, URINE
Creatinine, Urine: 62.86 mg/dL
Protein Creatinine Ratio: 2.51 mg/mg{Cre} — ABNORMAL HIGH (ref 0.00–0.15)
Total Protein, Urine: 158 mg/dL

## 2019-11-05 MED ORDER — SODIUM CHLORIDE 0.9 % IV SOLN
5.0000 mg/kg | INTRAVENOUS | Status: DC
  Administered 2019-11-05: 662.5 mg via INTRAVENOUS
  Filled 2019-11-05: qty 662.5

## 2019-11-06 LAB — CMV DNA, QUANTITATIVE, PCR
CMV DNA Quant: NEGATIVE IU/mL
Log10 CMV Qn DNA Pl: UNDETERMINED log10 IU/mL

## 2019-11-06 LAB — BK QUANT PCR (PLASMA/SERUM)
BK Quantitaion PCR: NEGATIVE copies/mL
Log10 BK Qn PCR: UNDETERMINED log10copy/mL

## 2019-11-18 ENCOUNTER — Other Ambulatory Visit: Payer: Self-pay | Admitting: Endocrinology

## 2019-11-20 ENCOUNTER — Encounter: Payer: Self-pay | Admitting: Cardiovascular Disease

## 2019-11-20 ENCOUNTER — Ambulatory Visit (INDEPENDENT_AMBULATORY_CARE_PROVIDER_SITE_OTHER): Payer: Medicare Other | Admitting: Cardiovascular Disease

## 2019-11-20 ENCOUNTER — Other Ambulatory Visit: Payer: Self-pay

## 2019-11-20 DIAGNOSIS — G4733 Obstructive sleep apnea (adult) (pediatric): Secondary | ICD-10-CM

## 2019-11-20 DIAGNOSIS — E785 Hyperlipidemia, unspecified: Secondary | ICD-10-CM | POA: Diagnosis not present

## 2019-11-20 DIAGNOSIS — E118 Type 2 diabetes mellitus with unspecified complications: Secondary | ICD-10-CM | POA: Diagnosis not present

## 2019-11-20 DIAGNOSIS — I350 Nonrheumatic aortic (valve) stenosis: Secondary | ICD-10-CM | POA: Diagnosis not present

## 2019-11-20 DIAGNOSIS — I10 Essential (primary) hypertension: Secondary | ICD-10-CM

## 2019-11-20 DIAGNOSIS — Z9989 Dependence on other enabling machines and devices: Secondary | ICD-10-CM | POA: Diagnosis not present

## 2019-11-20 DIAGNOSIS — Z9889 Other specified postprocedural states: Secondary | ICD-10-CM | POA: Diagnosis not present

## 2019-11-20 DIAGNOSIS — Z940 Kidney transplant status: Secondary | ICD-10-CM

## 2019-11-20 DIAGNOSIS — Z8679 Personal history of other diseases of the circulatory system: Secondary | ICD-10-CM

## 2019-11-20 DIAGNOSIS — Z794 Long term (current) use of insulin: Secondary | ICD-10-CM | POA: Diagnosis not present

## 2019-11-20 MED ORDER — AMLODIPINE BESYLATE 10 MG PO TABS: 10.0000 mg | ORAL_TABLET | Freq: Every day | ORAL | 3 refills | Status: DC

## 2019-11-20 NOTE — Progress Notes (Signed)
Patient ID: Robert [name], male   DOB: 03-21-1961, 59 y.o.   MRN: [id number]     HPI: Robert [name], is a 59 y.o. male who is a former patient of Dr. Terance Ice. He presents for a 9 month follow-up evaluation.  [name]  has a complex medical history that includes a congenital nonfunctioning left kidney for which he underwent left nephrectomy at age 50. He has a history of obesity, diabetes mellitus, and has been demonstrated to have proteinuria of his remaining single kidney, being status post open renal biopsy in [name] in November 2010. This revealed focal segmental glomerular sclerosis. At that time he was seen by Dr. Venita Sheffield in the nephrology department at [facility]. He sees Dr. Moshe Cipro in [place] for his kidney insufficiency and has been on ARB therapy in the past in an attempt to reduce proteinuria. He sees Dr. Dwyane Dee for his diabetes mellitus. He also has a history of hypothyroidism, hyperlipidemia, anxiety and depression. He has a history of prior supraventricular tachycardia requiring hospitalization in 2007 as was felt to be possibly AV nodal reentrant rhythm.  He has a history of complex sleep apnea initially diagnosed in 2007 with an AHI at that time of 56.7 per hour and during REM sleep this increased to 65.7 per hour. At that time he dropped his oxygen 84% and had loud snoring. He had been on BiPAP therapy. He underwent a 5 year followup split-night study in 2012 which again confirmed severe sleep apnea with an AHI of 60.7 on the baseline portion of the study. He was unable to achieve REM sleep at that time. Oxygen saturation during non-REM sleep at 83%. He was titrated utilizing BiPAP up to a setting of 16/12 cm water pressure. He does admit to continued residual daytime sleepiness despite using his BiPAP therapy.  An echo Doppler study in August 2012 showed mild concentric LVH with normal systolic function and grade 1  diastolic dysfunction. He had trace mitral and tricuspid insufficiency as well as pulmonic insufficiency. A nuclear perfusion study in August 2012 showed normal perfusion.  [name] was on dialysis Monday, [name].  He is on the [facility] transplant list.  In January 2017 he had an stress echo at [facility] as part of his transplant evaluation.  I do not have the results of this evaluation.  He has had some difficulty with a steal syndrome in his right hand since he had AV fistulas inserted in the forearm and most recently now in the upper arm.  He continues to use BiPAP with 100% compliance.  He had a [name].  He obtains his CPAP supplies online because they are cheaper, but had seen [facility].  He is using a Art therapist full face mask.  A download in the office from 05/28/2016 through 07/26/2016.  He is meeting compliance standards with usage at 85% usage stays greater than 4 hours at 77%.  He is averaging 6 hours and 43 minutes per night.  He is set at an IPAP pressure of 16 and EPAP pressure of 12.  His AHI was mildly increased at 6.5 with 5.2 per hour of apneic events and 1.3 of hypopnea events.  When I last saw him he had stopped taken Bystolic since his blood pressure had become low at the end of dialysis.  He had been on 5 mg on nondialysis days.  He tells me his aspirin was discontinued because he was  continuously having issues with bleeding following his dialysis when the catheter was removed.   He had an episode of SVT in December 2017, which was successfully aborted with vagal maneuvers.  In the emergency room, he had 2-3 more recurrences of SVT, of which, again, all aborted with vagal maneuvers.  He was started on verapamil and discharge from the hospital.  He has seen Dr. Camnitz and had an EP study with attempted ablation, but apparently he was not able to be induced and ablation was not performed.   He denies any recurrent episodes of SVT.  Remotely, he  may of had some mild nonexertional musculoskeletal discomfort but has not experienced any of this recently.  Reportedly, his remote stress echo was negative for ischemia, although mild ST changes were noted in recovery.  He continues to be asymptomatic.  He underwent a kidney transplant from a living donor on 02/25/2017 at [facility].  He tolerated surgery well but has developed a postoperative inguinal hernia, which will ultimately need to be surgically repaired.  He received a new BiPAP machine from [facility] 2 weeks ago.  He has a full face mask, but admits to increased leak.    Since I saw him in September 2018, he had 2 hospital stays at [facility].  He developed intestinal bleeding due to diverticular disease and also had some dehydration issues.  He also underwent inguinal hernia surgery.  He denies any recent chest pain or palpitations.  He continues to use BiPAP with 100% compliance.  However, he did not have his machine with him for the days he was in the hospital.  He brought his machine with him today for interrogation.  Over the past month, usage hours and 6 hours and 32 minutes per night with an HI of 0.9.  He used to machine 27 of 30 nights nights that were not used was because he was in the hospital.  Usage greater than 4 hours was 26 of 30.  He is on a BiPAP pressure of 18/13 0.9.  Recently he has noticed a mild leak.  AHI was 5.6 over the past 7 days.  He has a ResMed fullface mask, Mirage Quatro mask.    Since I last saw him in November 2018, he has felt well.  He is seeing Dr. Matthew Ellis at [facility] from a nephrology standpoint.  He denies any episodes of chest pain or awareness of recurrent arrhythmia.  He admits to 100% use with CPAP.  [employer] is his DME company.  He has a ResMed air 10 V auto unit with an EPAP pressure 14.  An IPAP pressure set at 18.  A download from 01/14/2017 through 02/12/2018 shows 100% compliance.  He is averaging 8 hours and 7 minutes of sleep.  AHI is excellent at  2.6.    I saw him in May 2019 and since that time, he has continued to follow at [facility]. He was last evaluated by me in a telemedicine visit in April 2020.  At that time he had noticed that over the past year  his blood pressure had increased and his lisinopril dose was increased to 20 mg and amlodipine reduced to 5 mg.  His weight had fluctuated and has increased from 293 to 303.  He has continued to use BiPAP with 100% compliance.  I was able to obtain a download today from March 23 through January 27, 2019.  He is 100% compliant with average BiPAP use of 7 hours and 10 minutes.    At his 18/14 pressure, AHI remains excellent at 2.6/h.  Over the past 6 months he has changed jobs twice.  He is been staying home particularly with his significant comorbidities which can be affected by the coronavirus including hypertension, diabetes mellitus, obesity, renal disease, and immunosuppression.  He walks approximately 2 times a day for short duration.  He receives immunosuppressant infusion with [employer] and is scheduled for his next infusion: On Feb 12, 2019.  His last lipid panel was in October 2019 with total cholesterol 137 and LDL cholesterol 73.  He is scheduled to see his nephrologist in August.    Since his last telemedicine evaluation in April 2020, he continues to be followed closely at [facility].  Creatinine has increased to 1.74.  He has noticed blood pressure elevation.  He has not been successful with weight loss.  He has been without anginal symptoms or awareness of recurrent SVT.  He has continued to use BiPAP therapy.  A download from 10/22/2019 through 11/20/1999 shows 93% of usage days.  Average use is 8 hours and 10 minutes.  At his pressure of 18/14, AHI is excellent at 1.5.  There is no mask leak.  He presents for reevaluation.   Past Medical History:  Diagnosis Date  . Anemia    low hgb at present  . Arthritis    FEET  . Blood transfusion without reported diagnosis 1974   kidney removed - L  .  Cancer ([place])    skin ca right shoulder, plastic dsyplasia, pre-Ca polpys removed on Colonoscopy- 07/2014  . Colon polyps 07/23/2014   Tubular adenomas x 5  . Constipation   . Diabetes mellitus without complication (HCC)    Type 2  . Difficult intubation    unsure of actual problem but it was during the January 28, 2015 procedure.  Marland Kitchen Dysrhythmia   . Eczema   . ESRD (end stage renal disease) ([place])    hEMO mwf  . Headache(784.0)    slight dull h/a due kidney failure  . Hyperlipidemia   . Hypertension    SVT h/o , followed by Dr. Claiborne Billings  . Hypothyroidism   . Macular degeneration   . Neuropathy    right hand and both feet  . PAT (paroxysmal atrial tachycardia) ([place])   . PSVT (paroxysmal supraventricular tachycardia) ([place])   . SBO (small bowel obstruction) ([place])   . Shortness of breath    with exertion  . Sleep apnea 06/18/11   split-night sleep study- [facility]., BiPAP- 13-16  . Thyroid disease   . Umbilical hernia    will repair with thi surgery    Past Surgical History:  Procedure Laterality Date  . AV FISTULA PLACEMENT Right 02/25/2015   Procedure: RIGHT ARTERIOVENOUS (AV) FISTULA CREATION;  Surgeon: Serafina Mitchell, MD;  Location: [place];  Service: Vascular;  Laterality: Right;  . AV FISTULA PLACEMENT Right 09/15/2015   Procedure: ARTERIOVENOUS (AV) FISTULA CREATION- RIGHT ARM;  Surgeon: Serafina Mitchell, MD;  Location: [place];  Service: Vascular;  Laterality: Right;  . CAPD INSERTION N/A 05/18/2014   Procedure: LAPAROSCOPIC INSERTION CONTINUOUS AMBULATORY PERITONEAL DIALYSIS  (CAPD) CATHETER;  Surgeon: Ralene Ok, MD;  Location: [place];  Service: General;  Laterality: N/A;  . COLONOSCOPY    . COLONOSCOPY WITH PROPOFOL N/A 07/12/2016   Procedure: COLONOSCOPY WITH PROPOFOL;  Surgeon: [name], MD;  Location: [facility];  Service: Endoscopy;  Laterality: N/A;  . declotting of fistula  08/2015  .  ESOPHAGOGASTRODUODENOSCOPY (EGD) WITH PROPOFOL N/A  07/12/2016   Procedure: ESOPHAGOGASTRODUODENOSCOPY (EGD) WITH PROPOFOL;  Surgeon: [name], MD;  Location: [facility];  Service: Endoscopy;  Laterality: N/A;  . FISTULA SUPERFICIALIZATION Right 02/07/2016   Procedure: RIIGHT UPPER ARM FISTULA SUPERFICIALIZATION;  Surgeon: Elam Dutch, MD;  Location: [place];  Service: Vascular;  Laterality: Right;  . IJ catheter insertion    . INSERTION OF DIALYSIS CATHETER Right 02/25/2015   Procedure: INSERTION OF RIGHT INTERNAL JUGULAR DIALYSIS CATHETER;  Surgeon: Serafina Mitchell, MD;  Location: [place];  Service: Vascular;  Laterality: Right;  . KIDNEY TRANSPLANT    . LAPAROSCOPIC REPOSITIONING CAPD CATHETER N/A 06/16/2014   Procedure: LAPAROSCOPIC REPOSITIONING CAPD CATHETER;  Surgeon: Ralene Ok, MD;  Location: [place];  Service: General;  Laterality: N/A;  . LAPAROSCOPY N/A 05/04/2015   Procedure: LAPAROSCOPY DIAGNOSTIC LYSIS OF ADHESIONS;  Surgeon: Coralie Keens, MD;  Location: [place];  Service: General;  Laterality: N/A;  . MINOR REMOVAL OF PERITONEAL DIALYSIS CATHETER N/A 04/28/2015   Procedure:  REMOVAL OF PERITONEAL DIALYSIS CATHETER;  Surgeon: Coralie Keens, MD;  Location: [place];  Service: General;  Laterality: N/A;  . NEPHRECTOMY Left 1974  . RENAL BIOPSY Right 2012  . REVISON OF ARTERIOVENOUS FISTULA Right 05/26/2015   Procedure: SUPERFICIALIZATION OF ARTERIOVENOUS FISTULA WITH SIDE BRANCH LIGATIONS;  Surgeon: Serafina Mitchell, MD;  Location: [place];  Service: Vascular;  Laterality: Right;  . SKIN CANCER EXCISION     right shoulder  . SVT ABLATION N/A 11/23/2016   Procedure: SVT Ablation;  Surgeon: Will Meredith Leeds, MD;  Location: [place] [facility];  Service: Cardiovascular;  Laterality: N/A;  . UMBILICAL HERNIA REPAIR N/A 05/18/2014   Procedure: HERNIA REPAIR UMBILICAL ADULT;  Surgeon: Ralene Ok, MD;  Location: [place];  Service: General;  Laterality: N/A;  . UMBILICAL HERNIA REPAIR N/A 04/28/2015   Procedure: UMBILICAL HERNIA  REPAIR WITH MESH;  Surgeon: Coralie Keens, MD;  Location: [place];  Service: General;  Laterality: N/A;    Left nephrectomy in 1974.  No Known Allergies  Current Outpatient Medications  Medication Sig Dispense Refill  . ACCU-CHEK FASTCLIX LANCETS MISC Use lancets to check blood sugar 5 times daily. 500 each 3  . aspirin EC 81 MG tablet Take 81 mg by mouth daily.    . B-D UF III MINI PEN NEEDLES 31G X 5 MM MISC USE 4 PER DAY TO INJECT INSULIN. 400 each 0  . BD INSULIN SYRINGE U/F 31G X 5/16" 1 ML MISC USE 3 TIMES A DAY WITH HUMULIN R 90 each 2  . belatacept (NULOJIX) 250 MG SOLR injection Inject 24.5 mLs into the vein every 28 (twenty-eight) days.     . Blood Glucose Monitoring Suppl (ACCU-CHEK GUIDE) w/Device KIT 1 each by Does not apply route daily. Use meter to check blood sugar 5 times daily. 1 kit 0  . Blood Glucose Monitoring Suppl (FIFTY50 GLUCOSE METER 2.0) w/Device KIT Use as directed    . DULoxetine (CYMBALTA) 30 MG capsule Take 30 mg by mouth daily.     . fluocinonide cream (LIDEX) 8.46 % Apply 1 application topically daily as needed (Skin inflammation (hand, thigh, back)).     . furosemide (LASIX) 20 MG tablet Take 40 mg by mouth daily as needed for edema.     . gabapentin (NEURONTIN) 100 MG capsule Take 200-300 mg by mouth See admin instructions. 200 mg in the morning and 300 mg at bedtime    . glucose  blood (ACCU-CHEK GUIDE) test strip 1 each by Other route as needed for other. Use as instructed to check blood sugar 5 times daily. 500 each 3  . HUMULIN R 500 UNIT/ML injection Using a U-100 1cc/31G/5/16" INSULIN SYRINGE, draw up and inject 5-18 units into the skin three times a day 30 minutes prior to each meal (Patient taking differently: Using a U-100 1cc/31G/5/16" INSULIN SYRINGE, draw up and inject 5-18 units into the skin three times a day 30 minutes prior to each meal) 10 mL 1  . Insulin Syringe-Needle U-100 (BD INSULIN SYRINGE ULTRAFINE) 31G X 5/16" 0.3 ML MISC Use to inject  insulin 2-3 times per day 300 each 1  . lactulose (CHRONULAC) 10 GM/15ML solution Take 10 g by mouth daily as needed.     . Lancets Misc. (ACCU-CHEK FASTCLIX LANCET) KIT 1 each by Does not apply route daily. 1 kit 0  . levothyroxine (SYNTHROID) 100 MCG tablet TAKE 1 TABLET BY MOUTH EVERY DAY 90 tablet 0  . liraglutide (VICTOZA) 18 MG/3ML SOPN Inject 0.3 mLs (1.8 mg total) into the skin daily. 9 pen 1  . lisinopril (ZESTRIL) 20 MG tablet Take 20 mg by mouth 2 (two) times a day. Take 1 tablet ('20mg'$  total) by mouth twice daily.    . metoprolol tartrate (LOPRESSOR) 50 MG tablet TAKE 1 TABLET BY MOUTH TWICE A DAY 180 tablet 2  . mycophenolate (CELLCEPT) 250 MG capsule Take 1,000 mg by mouth every 12 (twelve) hours.     . predniSONE (DELTASONE) 5 MG tablet Take 5 mg by mouth daily with breakfast.    . rosuvastatin (CRESTOR) 10 MG tablet TAKE 1 TABLET BY MOUTH EVERY DAY 90 tablet 1  . senna-docusate (SENOKOT-S) 8.6-50 MG tablet Take 1 tablet by mouth as needed for mild constipation.    . tamsulosin (FLOMAX) 0.4 MG CAPS capsule Take 0.8 mg by mouth at bedtime.     . Testosterone 20.25 MG/ACT (1.62%) GEL APPLY 3 PUMPS ON ONE ARM AND 2 ON THE OTHER EVERY MORNING ON SHOULDER AREA 450 g 1  . traMADol (ULTRAM) 50 MG tablet Take 1 tablet (50 mg total) by mouth every 6 (six) hours as needed. (Patient taking differently: Take 100 mg by mouth every 6 (six) hours as needed (for pain). ) 15 tablet 0  . traZODone (DESYREL) 50 MG tablet Take 50-100 mg by mouth at bedtime as needed for sleep.     . TRESIBA FLEXTOUCH 100 UNIT/ML SOPN FlexTouch Pen INJECT 38 UNITS UNDER THE SKIN ONCE DAILY. **PT MUST BE SEEN FOR FUTURE REFILLS.** (Patient taking differently: 41 Units. Inject 38 units under the skin once daily.) 12 mL 0  . UNABLE TO FIND BiPAP: At bedtime    . zolpidem (AMBIEN) 10 MG tablet Take 10-20 mg by mouth at bedtime as needed for sleep.     Marland Kitchen amLODipine (NORVASC) 10 MG tablet Take 1 tablet (10 mg total) by mouth  daily. 90 tablet 3   No current facility-administered medications for this visit.   Social history is notable in that he is married for 29 years. His 3 children. He has a degree as a chemistry. 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 He does drink occasional alcohol. He has smoked cigars.  ROS General: Negative; No fevers, chills, or night sweats; positive for morbid obesity HEENT: Negative; No changes in vision or hearing, sinus congestion, difficulty swallowing Pulmonary: Negative; No cough, wheezing, shortness of breath, hemoptysis Cardiovascular: No chest pain, PND, orthopnea, or recurrent  SVT. GI: Negative; No nausea, vomiting, diarrhea, or abdominal pain GU: Negative; No dysuria, hematuria, or difficulty voiding Renal: no longer on dialysis, status post renal transplant Musculoskeletal: Negative; no myalgias, joint pain, or weakness Hematologic/Oncology: Negative; no easy bruising, bleeding Endocrine: Positive for diabetes and hypothyroidism Neuro: Negative; no changes in balance, headaches Skin: Negative; No rashes or skin lesions Psychiatric: Negative; No behavioral problems, depression Sleep: Positive for OSA on BiPAP; no daytime sleepiness, hypersomnolence, bruxism, restless legs, hypnogognic hallucinations, no cataplexy Other comprehensive 14 point system review is negative.  PE BP (!) 167/84   Pulse 68   Temp (!) 95.9 F (35.5 C)   Ht '5\' 11"'$  (1.803 m)   Wt 293 lb 6.4 oz (133.1 kg)   SpO2 97%   BMI 40.92 kg/m    Repeat blood pressure by me was 160/88 taken from his left arm.  He has a right upper arm fistula.  Wt Readings from Last 3 Encounters:  11/20/19 293 lb 6.4 oz (133.1 kg)  11/05/19 290 lb 14.4 oz (132 kg)  10/05/19 290 lb 12.8 oz (131.9 kg)   General: Alert, oriented, no distress.  Morbid obesity Skin: normal turgor, no rashes, warm and dry HEENT: Normocephalic, atraumatic. Pupils equal round and reactive to light; sclera anicteric; extraocular muscles  intact; Nose without nasal septal hypertrophy Mouth/Parynx benign; Mallinpatti scale 3 Neck: No JVD, no carotid bruits; normal carotid upstroke Lungs: clear to ausculatation and percussion; no wheezing or rales Chest wall: without tenderness to palpitation Heart: PMI not displaced, RRR, s1 s2 normal, 2/6 systolic murmur in the aortic area, no diastolic murmur, no rubs, gallops, thrills, or heaves Abdomen: soft, nontender; no hepatosplenomehaly, BS+; abdominal aorta nontender and not dilated by palpation. Back: no CVA tenderness Pulses 2+ Musculoskeletal: full range of motion, normal strength, no joint deformities Extremities: no clubbing cyanosis or edema, Homan's sign negative  Neurologic: grossly nonfocal; Cranial nerves grossly wnl Psychologic: Normal mood and affect   ECG (independently read by me): Normal sinus rhythm at 68 bpm.  Q-wave lead III.  Mild inferior T wave abnormality.  Normal intervals.  QTc interval 440 ms.  No ectopy  May 2019 ECG (independently read by me): NSR at 64; normal intervals.  No ectopy  November 2018 ECG (independently read by me): normal sinus rhythm at 67 bpm.  Normal intervals.  No ectopy.  September 2018 ECG (independently read by me): normal sinus rhythm at 71 bpm.  No ST segment changes.  Normal intervals.  May 2018 ECG (independently read by me): Normal sinus rhythm at 89 bpm.  No STT changes .  PR interval 126 ms.  QTc interval 457 ms. Normal ECG.  October 2017 ECG (independently read by me): Sinus tachycardia 101 bpm.  April 2017 ECG (independently read by me): Sinus tachycardia 100 bpm.  2.  TC interval 45 ms.  November 2014 ECG: Normal sinus 34 beats per minute. PR interval 138 ms. QT interval 448 ms.  LABS: BMP Latest Ref Rng & Units 11/05/2019 10/05/2019 09/09/2019  Glucose 70 - 99 mg/dL 202(H) 292(H) 287(H)  BUN 6 - 20 mg/dL 29(H) 27(H) 25(H)  Creatinine 0.61 - 1.24 mg/dL 1.74(H) 1.86(H) 1.76(H)  BUN/Creat Ratio 9 - 20 - - -  Sodium  135 - 145 mmol/L 136 137 137  Potassium 3.5 - 5.1 mmol/L 4.1 4.0 4.4  Chloride 98 - 111 mmol/L 105 103 105  CO2 22 - 32 mmol/L '23 24 22  '$ Calcium 8.9 - 10.3 mg/dL 9.8 9.9 9.5  Hepatic Function Latest Ref Rng & Units 08/03/2019 07/07/2019 11/12/2018  Total Protein 6.0 - 8.3 g/dL 6.4 - 5.9(L)  Albumin 3.5 - 5.2 g/dL 4.3 - 4.3  AST 0 - 37 U/L '17 17 15  '$ ALT 0 - 53 U/L '23 18 20  '$ Alk Phosphatase 39 - 117 U/L 92 84 113  Total Bilirubin 0.2 - 1.2 mg/dL 0.7 - 0.8    CBC Latest Ref Rng & Units 11/05/2019 10/05/2019 09/09/2019  WBC 4.0 - 10.5 K/uL 6.7 6.2 8.9  Hemoglobin 13.0 - 17.0 g/dL 14.6 15.0 14.2  Hematocrit 39.0 - 52.0 % 45.1 44.5 43.5  Platelets 150 - 400 K/uL 256 212 240    Lab Results  Component Value Date   MCV 86.4 11/05/2019   MCV 85.4 10/05/2019   MCV 86.0 09/09/2019   Lab Results  Component Value Date   TSH 1.80 08/03/2019   Lab Results  Component Value Date   HGBA1C 7.2 (H) 08/03/2019   Lipid Panel     Component Value Date/Time   CHOL 158 07/07/2019 0000   TRIG 93 07/07/2019 0000   HDL 47 07/07/2019 0000   CHOLHDL 3 04/29/2019 0848   VLDL 18.4 04/29/2019 0848   LDLCALC 92 07/07/2019 0000    RADIOLOGY: No results found.  IMPRESSION:  1. Essential hypertension   2. S/P ablation operation for arrhythmia   3. Aortic valve stenosis, etiology of cardiac valve disease unspecified   4. Morbid obesity ([place])   5. OSA on BIPAP   6. History of renal transplantation   7. Type 2 diabetes mellitus with complication, with long-term current use of insulin ([place])   8. Hyperlipidemia with target LDL less than 70     ASSESSMENT AND PLAN: Mr. Marsala is a 59 year old Caucasian male who has a history of morbid obesity, hypertension, diabetes mellitus, hyperlipidemia, and end-stage kidney disease.  He is status post remote nephrectomy  1974 and on 02/25/2017 after having been on hemodialysis he underwent successful renal transplantation from a living donor done at [facility].  He developed a postoperative inguinal hernia, which subsequently was repaired.  There also was a history of possible diverticular bleeding.  He has a history of SVT, but he has not had any recurrence.  He is followed closely by the transplantation team at [facility].  Creatinine in September 2020 was 1.74.  At that time, LDL cholesterol was 92.  Blood pressure today is elevated and on repeat by me was 160/88 despite taking amlodipine 5 mg, furosemide 40 mg daily, lisinopril 20 mg twice a day in addition to metoprolol tartrate 50 mg twice a day.  With his blood pressure elevation I have recommended he increase amlodipine to 10 mg.  He will be following up with the transplant nephrologist at [facility] in March.  On exam today he has a 2/6 systolic murmur consistent with aortic stenosis.  I am suggesting a follow-up echo Doppler study for reevaluation of systolic and diastolic function and to reevaluate his aortic stenosis.  He is diabetic on insulin.  He has been on rosuvastatin 10 mg for hyperlipidemia.  Laboratory last year showed an LDL of 92.  If on repeat laboratory his LDL is greater than 70 I would strongly recommend increasing rosuvastatin to either 20 or 40 mg for more aggressive lipid-lowering therapy.  He continues to use BiPAP.  A download obtained in the office today demonstrates excellent compliance.  AHI is 1.5 on his 18/14 BiPAP pressure.  There is no  mask leak.  I discussed the importance of weight loss and exercise particularly with his BMI of 40.9 consistent with morbid obesity.  I will see in 4 months for reevaluation or sooner as needed.    His blood pressure today is controlled on amlodipine 5 mg, metoprolol 50 mg twice a day, and he was started on lisinopril by his nephrologist at [facility], currently 5 mg daily.  He is on Crestor 10 mg for hyperlipidemia with target LDL less than 70.  He has hypothyroidism and is maintained on levothyroxine at 100 g.  He has long-standing  diabetes and is on insulin.  He is on CPAP therapy.  I obtained a download in the office which shows 100% compliance.  On his current 18/14 BiPAP pressure.  AHI is excellent at 2.6 per hour.  BMI today is 41.42.  Weight loss and increased exercise was recommended.  He has documented mild-to-moderate aortic insufficiency.  His last echo Doppler study was in January 2018.  He will continue present therapy.  I will see him in 6 months for reevaluation, and prior to that evaluation, he will undergo a follow-up echo Doppler study to assess systolic and diastolic function as well as his aortic valve disease.   Time spent: 25 minutes [place] Sine, MD, [facility]  11/22/2019 10:15 AM

## 2019-11-20 NOTE — Patient Instructions (Addendum)
Medication Instructions:  INCREASE AMLODIPINE TO 10 MG DAILY (1 TABLET DAILY) *If you need a refill on your cardiac medications before your next appointment, please call your pharmacy*   Your physician has requested that you have an echocardiogram. Echocardiography is a painless test that uses sound waves to create images of your heart. It provides your doctor with information about the size and shape of your heart and how well your heart's chambers and valves are working. This procedure takes approximately one hour. There are no restrictions for this procedure.  [place] [address] Follow-Up: At [facility], you and your health needs are our priority.  As part of our continuing mission to provide you with exceptional heart care, we have created designated Provider Care Teams.  These Care Teams include your primary Cardiologist (physician) and Advanced Practice Providers (APPs -  Physician Assistants and Nurse Practitioners) who all work together to provide you with the care you need, when you need it.  Your next appointment:   3-4 month(s)  The format for your next appointment:   In Person  Provider:   Shelva Majestic, MD

## 2019-11-22 ENCOUNTER — Encounter: Payer: Self-pay | Admitting: Cardiovascular Disease

## 2019-11-23 ENCOUNTER — Telehealth: Payer: Self-pay | Admitting: Cardiovascular Disease

## 2019-11-23 NOTE — Telephone Encounter (Signed)
New message  Patient wants to know if he needs to setup appt as a follow up after echo. Please advise.

## 2019-11-23 NOTE — Telephone Encounter (Signed)
Spoke with pt, according to the last office note follow up was in 4 months. Follow up scheduled virtual visit at patient request.

## 2019-11-25 ENCOUNTER — Other Ambulatory Visit (INDEPENDENT_AMBULATORY_CARE_PROVIDER_SITE_OTHER): Payer: Medicare Other

## 2019-11-25 DIAGNOSIS — I10 Essential (primary) hypertension: Secondary | ICD-10-CM

## 2019-11-26 ENCOUNTER — Other Ambulatory Visit: Payer: Self-pay | Admitting: Endocrinology

## 2019-11-30 ENCOUNTER — Other Ambulatory Visit: Payer: Self-pay

## 2019-11-30 DIAGNOSIS — Z9079 Acquired absence of other genital organ(s): Secondary | ICD-10-CM | POA: Diagnosis not present

## 2019-11-30 DIAGNOSIS — R3915 Urgency of urination: Secondary | ICD-10-CM | POA: Diagnosis not present

## 2019-11-30 DIAGNOSIS — R3914 Feeling of incomplete bladder emptying: Secondary | ICD-10-CM | POA: Diagnosis not present

## 2019-11-30 DIAGNOSIS — N433 Hydrocele, unspecified: Secondary | ICD-10-CM | POA: Diagnosis not present

## 2019-11-30 DIAGNOSIS — R350 Frequency of micturition: Secondary | ICD-10-CM | POA: Diagnosis not present

## 2019-11-30 DIAGNOSIS — N401 Enlarged prostate with lower urinary tract symptoms: Secondary | ICD-10-CM | POA: Diagnosis not present

## 2019-11-30 DIAGNOSIS — N5239 Other post-surgical erectile dysfunction: Secondary | ICD-10-CM | POA: Diagnosis not present

## 2019-11-30 MED ORDER — TRESIBA FLEXTOUCH 100 UNIT/ML ~~LOC~~ SOPN: PEN_INJECTOR | SUBCUTANEOUS | 0 refills | Status: DC
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
## 2019-12-03 ENCOUNTER — Inpatient Hospital Stay (HOSPITAL_COMMUNITY)
Admission: RE | Admit: 2019-12-03 | Discharge: 2019-12-03 | Disposition: A | Discharge status: Home / Self Care | Payer: Medicare Other | Source: Ambulatory Visit | Attending: Internal Medicine | Admitting: Internal Medicine

## 2019-12-03 ENCOUNTER — Other Ambulatory Visit (HOSPITAL_COMMUNITY): Payer: Self-pay | Admitting: *Deleted

## 2019-12-04 ENCOUNTER — Inpatient Hospital Stay (HOSPITAL_COMMUNITY): Admission: RE | Admit: 2019-12-04 | Payer: Medicare Other | Source: Ambulatory Visit

## 2019-12-08 ENCOUNTER — Other Ambulatory Visit: Payer: Self-pay

## 2019-12-08 ENCOUNTER — Telehealth: Payer: Self-pay | Admitting: Endocrinology

## 2019-12-08 ENCOUNTER — Ambulatory Visit (HOSPITAL_COMMUNITY): Payer: Medicare Other | Attending: Internal Medicine

## 2019-12-08 DIAGNOSIS — Z8679 Personal history of other diseases of the circulatory system: Secondary | ICD-10-CM | POA: Insufficient documentation

## 2019-12-08 DIAGNOSIS — E785 Hyperlipidemia, unspecified: Secondary | ICD-10-CM | POA: Diagnosis not present

## 2019-12-08 DIAGNOSIS — I10 Essential (primary) hypertension: Secondary | ICD-10-CM | POA: Diagnosis not present

## 2019-12-08 DIAGNOSIS — Z9483 Pancreas transplant status: Secondary | ICD-10-CM | POA: Diagnosis not present

## 2019-12-08 DIAGNOSIS — E039 Hypothyroidism, unspecified: Secondary | ICD-10-CM | POA: Diagnosis not present

## 2019-12-08 DIAGNOSIS — E119 Type 2 diabetes mellitus without complications: Secondary | ICD-10-CM | POA: Insufficient documentation

## 2019-12-08 DIAGNOSIS — I350 Nonrheumatic aortic (valve) stenosis: Secondary | ICD-10-CM | POA: Diagnosis not present

## 2019-12-08 DIAGNOSIS — Z9889 Other specified postprocedural states: Secondary | ICD-10-CM | POA: Insufficient documentation

## 2019-12-08 DIAGNOSIS — G4730 Sleep apnea, unspecified: Secondary | ICD-10-CM | POA: Insufficient documentation

## 2019-12-08 DIAGNOSIS — Z5181 Encounter for therapeutic drug level monitoring: Secondary | ICD-10-CM | POA: Diagnosis not present

## 2019-12-08 DIAGNOSIS — I119 Hypertensive heart disease without heart failure: Secondary | ICD-10-CM | POA: Insufficient documentation

## 2019-12-08 DIAGNOSIS — R011 Cardiac murmur, unspecified: Secondary | ICD-10-CM | POA: Insufficient documentation

## 2019-12-08 DIAGNOSIS — Z940 Kidney transplant status: Secondary | ICD-10-CM | POA: Diagnosis not present

## 2019-12-08 MED ORDER — TRESIBA FLEXTOUCH 100 UNIT/ML ~~LOC~~ SOPN: PEN_INJECTOR | SUBCUTANEOUS | 0 refills | Status: DC

## 2019-12-08 NOTE — Telephone Encounter (Signed)
Dr Dwyane Dee told pt to take 83 u of tresiba daily. rx was just delivered for only 38 u per day. We need to [facility] in the updated [facility]   Copay for this has already been paid. He cannot get this new rx yet as the copay is $75 what can he do?

## 2019-12-08 NOTE — Telephone Encounter (Signed)
Rx has been updated. Pt should be able to get the difference in what was given to him at pharmacy and what is still needed.

## 2019-12-09 ENCOUNTER — Ambulatory Visit (INDEPENDENT_AMBULATORY_CARE_PROVIDER_SITE_OTHER): Payer: Medicare Other | Admitting: Podiatry

## 2019-12-09 ENCOUNTER — Encounter: Payer: Self-pay | Admitting: Podiatry

## 2019-12-09 VITALS — Temp 97.8°F

## 2019-12-09 DIAGNOSIS — E1149 Type 2 diabetes mellitus with other diabetic neurological complication: Secondary | ICD-10-CM | POA: Diagnosis not present

## 2019-12-09 DIAGNOSIS — M79675 Pain in left toe(s): Secondary | ICD-10-CM | POA: Diagnosis not present

## 2019-12-09 DIAGNOSIS — B351 Tinea unguium: Secondary | ICD-10-CM | POA: Diagnosis not present

## 2019-12-09 DIAGNOSIS — M79674 Pain in right toe(s): Secondary | ICD-10-CM

## 2019-12-09 NOTE — Progress Notes (Signed)
Patient ID: Robert [name], male   DOB: [birth year], 59 y.o.   MRN: [id number] Complaint:  Visit Type: Patient returns to my office for continued preventative foot care services. Complaint: Patient states" my nails have grown long and thick and become painful to walk and wear shoes" Patient has been diagnosed with DM and had a kidney transplant right side.  This is leading to increased leg swelling left.  . The patient presents for preventative foot care services. No changes to ROS  Podiatric Exam: Vascular: dorsalis pedis and posterior tibial pulses are palpable bilateral. Capillary return is immediate. Temperature gradient is WNL. Skin turgor WNL  Sensorium: Diminished  Semmes Weinstein monofilament test. Normal tactile sensation bilaterally. Nail Exam: Pt has thick disfigured discolored nails with subungual debris noted bilateral entire nail hallux through fifth toenails Ulcer Exam: There is no evidence of ulcer or pre-ulcerative changes or infection. Orthopedic Exam: Muscle tone and strength are WNL. No limitations in general ROM. No crepitus or effusions noted. Foot type and digits show no abnormalities. Bony prominences are unremarkable. HAV  B/L Skin: No Porokeratosis. No infection or ulcers  Diagnosis:  Onychomycosis, , Pain in right toe, pain in left toes, Diabetic neuropathy  Hallux limitus 1st MPJ  B/L  Treatment & Plan Procedures and Treatment: Consent by patient was obtained for treatment procedures. The patient understood the discussion of treatment and procedures well. All questions were answered thoroughly reviewed. Debridement of mycotic and hypertrophic toenails, 1 through 5 bilateral and clearing of subungual debris. No ulceration, no infection noted.  Patient qualifies for diabetic shoes due to DPN and HAV  B/L. Return Visit-Office Procedure: Patient instructed to return to the office for a follow up visit 3 months for continued evaluation and treatment.    Gardiner Barefoot DPM

## 2019-12-10 ENCOUNTER — Ambulatory Visit (HOSPITAL_COMMUNITY)
Admission: RE | Admit: 2019-12-10 | Discharge: 2019-12-10 | Disposition: A | Discharge status: Home / Self Care | Payer: Medicare Other | Source: Ambulatory Visit | Attending: Internal Medicine | Admitting: Internal Medicine

## 2019-12-10 ENCOUNTER — Other Ambulatory Visit: Payer: Self-pay

## 2019-12-10 DIAGNOSIS — Z5181 Encounter for therapeutic drug level monitoring: Secondary | ICD-10-CM | POA: Insufficient documentation

## 2019-12-10 DIAGNOSIS — Z940 Kidney transplant status: Secondary | ICD-10-CM | POA: Diagnosis not present

## 2019-12-10 LAB — CBC
HCT: 43.3 % (ref 39.0–52.0)
Hemoglobin: 14.1 g/dL (ref 13.0–17.0)
MCH: 28.4 pg (ref 26.0–34.0)
MCHC: 32.6 g/dL (ref 30.0–36.0)
MCV: 87.3 fL (ref 80.0–100.0)
Platelets: 274 10*3/uL (ref 150–400)
RBC: 4.96 MIL/uL (ref 4.22–5.81)
RDW: 14.1 % (ref 11.5–15.5)
WBC: 9 10*3/uL (ref 4.0–10.5)
nRBC: 0 % (ref 0.0–0.2)

## 2019-12-10 LAB — URINALYSIS, COMPLETE (UACMP) WITH MICROSCOPIC
Bacteria, UA: NONE SEEN
Bilirubin Urine: NEGATIVE
Glucose, UA: 50 mg/dL — AB
Hgb urine dipstick: NEGATIVE
Ketones, ur: NEGATIVE mg/dL
Leukocytes,Ua: NEGATIVE
Nitrite: NEGATIVE
Protein, ur: 100 mg/dL — AB
Specific Gravity, Urine: 1.021 (ref 1.005–1.030)
pH: 6 (ref 5.0–8.0)

## 2019-12-10 LAB — BASIC METABOLIC PANEL
Anion gap: 11 (ref 5–15)
BUN: 24 mg/dL — ABNORMAL HIGH (ref 6–20)
CO2: 24 mmol/L (ref 22–32)
Calcium: 9.5 mg/dL (ref 8.9–10.3)
Chloride: 104 mmol/L (ref 98–111)
Creatinine, Ser: 1.8 mg/dL — ABNORMAL HIGH (ref 0.61–1.24)
GFR calc Af Amer: 46 mL/min — ABNORMAL LOW (ref >60)
GFR calc non Af Amer: 40 mL/min — ABNORMAL LOW (ref >60)
Glucose, Bld: 242 mg/dL — ABNORMAL HIGH (ref 70–99)
Potassium: 4.8 mmol/L (ref 3.5–5.1)
Sodium: 139 mmol/L (ref 135–145)

## 2019-12-10 LAB — PROTEIN / CREATININE RATIO, URINE
Creatinine, Urine: 137.09 mg/dL
Protein Creatinine Ratio: 2.11 mg/mg{Cre} — ABNORMAL HIGH (ref 0.00–0.15)
Total Protein, Urine: 289 mg/dL

## 2019-12-10 MED ORDER — SODIUM CHLORIDE 0.9 % IV SOLN
612.5000 mg | INTRAVENOUS | Status: DC
  Administered 2019-12-10: 09:00:00 612.5 mg via INTRAVENOUS
  Filled 2019-12-10: qty 612.5

## 2019-12-11 LAB — BK QUANT PCR (PLASMA/SERUM)
BK Quantitaion PCR: NEGATIVE copies/mL
Log10 BK Qn PCR: UNDETERMINED log10copy/mL

## 2019-12-11 LAB — CMV DNA, QUANTITATIVE, PCR
CMV DNA Quant: NEGATIVE IU/mL
Log10 CMV Qn DNA Pl: UNDETERMINED log10 IU/mL

## 2019-12-21 DIAGNOSIS — Z940 Kidney transplant status: Secondary | ICD-10-CM | POA: Diagnosis not present

## 2019-12-21 DIAGNOSIS — N3281 Overactive bladder: Secondary | ICD-10-CM | POA: Diagnosis not present

## 2019-12-22 DIAGNOSIS — D849 Immunodeficiency, unspecified: Secondary | ICD-10-CM | POA: Diagnosis not present

## 2019-12-22 DIAGNOSIS — Z792 Long term (current) use of antibiotics: Secondary | ICD-10-CM | POA: Diagnosis not present

## 2019-12-22 DIAGNOSIS — E119 Type 2 diabetes mellitus without complications: Secondary | ICD-10-CM | POA: Diagnosis not present

## 2019-12-22 DIAGNOSIS — I10 Essential (primary) hypertension: Secondary | ICD-10-CM | POA: Diagnosis not present

## 2019-12-22 DIAGNOSIS — Z4822 Encounter for aftercare following kidney transplant: Secondary | ICD-10-CM | POA: Diagnosis not present

## 2019-12-22 DIAGNOSIS — E669 Obesity, unspecified: Secondary | ICD-10-CM | POA: Diagnosis not present

## 2019-12-22 DIAGNOSIS — N3289 Other specified disorders of bladder: Secondary | ICD-10-CM | POA: Diagnosis not present

## 2019-12-22 DIAGNOSIS — Z940 Kidney transplant status: Secondary | ICD-10-CM | POA: Diagnosis not present

## 2019-12-23 ENCOUNTER — Other Ambulatory Visit: Payer: Self-pay

## 2019-12-23 ENCOUNTER — Ambulatory Visit: Payer: Medicare Other | Admitting: Orthotics

## 2019-12-24 ENCOUNTER — Telehealth: Payer: Self-pay | Admitting: Podiatry

## 2019-12-24 NOTE — Telephone Encounter (Signed)
Pt left voicemail asking about the diabetic shoes he picked yesterday with Betha. She asked if he wanted brown or black and he chose black but has decided he wanted to go with brown.  I returned call and left message that I have changed the order to brown and to call if any questions.

## 2019-12-27 DIAGNOSIS — Z23 Encounter for immunization: Secondary | ICD-10-CM | POA: Diagnosis not present

## 2019-12-28 ENCOUNTER — Telehealth: Payer: Self-pay | Admitting: Podiatry

## 2019-12-28 DIAGNOSIS — N5203 Combined arterial insufficiency and corporo-venous occlusive erectile dysfunction: Secondary | ICD-10-CM | POA: Diagnosis not present

## 2019-12-28 DIAGNOSIS — N3281 Overactive bladder: Secondary | ICD-10-CM | POA: Insufficient documentation

## 2019-12-28 HISTORY — DX: Combined arterial insufficiency and corporo-venous occlusive erectile dysfunction: N52.03

## 2019-12-28 HISTORY — DX: Overactive bladder: N32.81

## 2019-12-28 NOTE — Telephone Encounter (Signed)
Pt called upset stating he had left message for Dawn and no one has returned call.   I told pt I left message on 3.18 @ 255 that we had changed the color of the shoes to brown.

## 2019-12-30 LAB — HM DIABETES EYE EXAM

## 2020-01-05 ENCOUNTER — Other Ambulatory Visit: Payer: Self-pay | Admitting: Endocrinology

## 2020-01-06 NOTE — Telephone Encounter (Signed)
Okay to send 90-day prescription with no refills but need to schedule him for follow-up with labs

## 2020-01-06 NOTE — Telephone Encounter (Signed)
Please advise if refill is appropriate 

## 2020-01-06 NOTE — Telephone Encounter (Signed)
Outpatient Medication Detail   Disp Refills Start End   rosuvastatin (CRESTOR) 10 MG tablet 90 tablet 0 01/06/2020    Sig - Route: Take 1 tablet (10 mg total) by mouth daily. WILL ONLY PROVIDE 90 DAY SUPPLY. MUST CALL TO SCHEDULE APPT. - Oral   Sent to pharmacy as: rosuvastatin (CRESTOR) 10 MG tablet   Notes to Pharmacy: WILL ONLY PROVIDE 90 DAY SUPPLY. MUST CALL TO SCHEDULE APPT.   E-Prescribing Status: Receipt confirmed by pharmacy (01/06/2020  8:22 AM EDT)

## 2020-01-07 ENCOUNTER — Ambulatory Visit (HOSPITAL_COMMUNITY)
Admission: RE | Admit: 2020-01-07 | Discharge: 2020-01-07 | Disposition: A | Discharge status: Home / Self Care | Payer: Medicare Other | Source: Ambulatory Visit | Attending: Internal Medicine | Admitting: Internal Medicine

## 2020-01-07 ENCOUNTER — Other Ambulatory Visit: Payer: Self-pay

## 2020-01-07 ENCOUNTER — Telehealth: Payer: Self-pay | Admitting: Endocrinology

## 2020-01-07 DIAGNOSIS — Z940 Kidney transplant status: Secondary | ICD-10-CM | POA: Diagnosis not present

## 2020-01-07 DIAGNOSIS — Z5181 Encounter for therapeutic drug level monitoring: Secondary | ICD-10-CM | POA: Diagnosis not present

## 2020-01-07 LAB — BASIC METABOLIC PANEL
Anion gap: 11 (ref 5–15)
BUN: 32 mg/dL — ABNORMAL HIGH (ref 6–20)
CO2: 21 mmol/L — ABNORMAL LOW (ref 22–32)
Calcium: 9.3 mg/dL (ref 8.9–10.3)
Chloride: 105 mmol/L (ref 98–111)
Creatinine, Ser: 2.13 mg/dL — ABNORMAL HIGH (ref 0.61–1.24)
GFR calc Af Amer: 38 mL/min — ABNORMAL LOW (ref >60)
GFR calc non Af Amer: 33 mL/min — ABNORMAL LOW (ref >60)
Glucose, Bld: 231 mg/dL — ABNORMAL HIGH (ref 70–99)
Potassium: 4.1 mmol/L (ref 3.5–5.1)
Sodium: 137 mmol/L (ref 135–145)

## 2020-01-07 LAB — URINALYSIS, COMPLETE (UACMP) WITH MICROSCOPIC
Bacteria, UA: NONE SEEN
Bilirubin Urine: NEGATIVE
Glucose, UA: 150 mg/dL — AB
Hgb urine dipstick: NEGATIVE
Ketones, ur: NEGATIVE mg/dL
Leukocytes,Ua: NEGATIVE
Nitrite: NEGATIVE
Protein, ur: 100 mg/dL — AB
Specific Gravity, Urine: 1.02 (ref 1.005–1.030)
pH: 5 (ref 5.0–8.0)

## 2020-01-07 LAB — CBC
HCT: 42.7 % (ref 39.0–52.0)
Hemoglobin: 13.8 g/dL (ref 13.0–17.0)
MCH: 28.5 pg (ref 26.0–34.0)
MCHC: 32.3 g/dL (ref 30.0–36.0)
MCV: 88 fL (ref 80.0–100.0)
Platelets: 233 10*3/uL (ref 150–400)
RBC: 4.85 MIL/uL (ref 4.22–5.81)
RDW: 14.1 % (ref 11.5–15.5)
WBC: 7.2 10*3/uL (ref 4.0–10.5)
nRBC: 0 % (ref 0.0–0.2)

## 2020-01-07 LAB — PROTEIN / CREATININE RATIO, URINE
Creatinine, Urine: 138.97 mg/dL
Protein Creatinine Ratio: 1.01 mg/mg{Cre} — ABNORMAL HIGH (ref 0.00–0.15)
Total Protein, Urine: 140 mg/dL

## 2020-01-07 MED ORDER — SODIUM CHLORIDE 0.9 % IV SOLN
612.5000 mg | INTRAVENOUS | Status: DC
  Administered 2020-01-07: 612.5 mg via INTRAVENOUS
  Filled 2020-01-07: qty 612.5

## 2020-01-07 NOTE — Telephone Encounter (Signed)
-----   Message from Elayne Snare, MD sent at 01/07/2020  9:37 AM EDT ----- Regarding: Please call Patient overdue for appointment.  Please call to schedule with labs before appointment

## 2020-01-07 NOTE — Telephone Encounter (Signed)
LMTCB

## 2020-01-08 LAB — CMV DNA, QUANTITATIVE, PCR
CMV DNA Quant: NEGATIVE IU/mL
Log10 CMV Qn DNA Pl: UNDETERMINED log10 IU/mL

## 2020-01-11 LAB — BK QUANT PCR (PLASMA/SERUM)
BK Quantitaion PCR: NEGATIVE copies/mL
Log10 BK Qn PCR: UNDETERMINED log10copy/mL

## 2020-01-24 DIAGNOSIS — Z23 Encounter for immunization: Secondary | ICD-10-CM | POA: Diagnosis not present

## 2020-01-26 DIAGNOSIS — R809 Proteinuria, unspecified: Secondary | ICD-10-CM | POA: Diagnosis not present

## 2020-01-26 DIAGNOSIS — G629 Polyneuropathy, unspecified: Secondary | ICD-10-CM | POA: Diagnosis not present

## 2020-01-26 DIAGNOSIS — Z794 Long term (current) use of insulin: Secondary | ICD-10-CM | POA: Diagnosis not present

## 2020-01-26 DIAGNOSIS — D631 Anemia in chronic kidney disease: Secondary | ICD-10-CM | POA: Diagnosis not present

## 2020-01-26 DIAGNOSIS — E1129 Type 2 diabetes mellitus with other diabetic kidney complication: Secondary | ICD-10-CM | POA: Diagnosis not present

## 2020-01-26 DIAGNOSIS — G4733 Obstructive sleep apnea (adult) (pediatric): Secondary | ICD-10-CM | POA: Diagnosis not present

## 2020-01-26 DIAGNOSIS — I120 Hypertensive chronic kidney disease with stage 5 chronic kidney disease or end stage renal disease: Secondary | ICD-10-CM | POA: Diagnosis not present

## 2020-01-26 DIAGNOSIS — Z940 Kidney transplant status: Secondary | ICD-10-CM | POA: Diagnosis not present

## 2020-01-26 DIAGNOSIS — D8989 Other specified disorders involving the immune mechanism, not elsewhere classified: Secondary | ICD-10-CM | POA: Diagnosis not present

## 2020-01-26 DIAGNOSIS — N1832 Chronic kidney disease, stage 3b: Secondary | ICD-10-CM | POA: Diagnosis not present

## 2020-01-26 DIAGNOSIS — F5104 Psychophysiologic insomnia: Secondary | ICD-10-CM | POA: Diagnosis not present

## 2020-02-04 ENCOUNTER — Other Ambulatory Visit: Payer: Self-pay | Admitting: Endocrinology

## 2020-02-04 ENCOUNTER — Other Ambulatory Visit: Payer: Self-pay

## 2020-02-04 ENCOUNTER — Ambulatory Visit (HOSPITAL_COMMUNITY)
Admission: RE | Admit: 2020-02-04 | Discharge: 2020-02-04 | Disposition: A | Discharge status: Home / Self Care | Payer: Medicare Other | Source: Ambulatory Visit | Attending: Internal Medicine | Admitting: Internal Medicine

## 2020-02-04 DIAGNOSIS — Z940 Kidney transplant status: Secondary | ICD-10-CM | POA: Insufficient documentation

## 2020-02-04 LAB — BASIC METABOLIC PANEL
Anion gap: 10 (ref 5–15)
BUN: 27 mg/dL — ABNORMAL HIGH (ref 6–20)
CO2: 23 mmol/L (ref 22–32)
Calcium: 9.8 mg/dL (ref 8.9–10.3)
Chloride: 102 mmol/L (ref 98–111)
Creatinine, Ser: 1.91 mg/dL — ABNORMAL HIGH (ref 0.61–1.24)
GFR calc Af Amer: 43 mL/min — ABNORMAL LOW (ref >60)
GFR calc non Af Amer: 37 mL/min — ABNORMAL LOW (ref >60)
Glucose, Bld: 366 mg/dL — ABNORMAL HIGH (ref 70–99)
Potassium: 4.3 mmol/L (ref 3.5–5.1)
Sodium: 135 mmol/L (ref 135–145)

## 2020-02-04 LAB — URINALYSIS, COMPLETE (UACMP) WITH MICROSCOPIC
Bacteria, UA: NONE SEEN
Bilirubin Urine: NEGATIVE
Glucose, UA: >=500 mg/dL — AB
Ketones, ur: NEGATIVE mg/dL
Leukocytes,Ua: NEGATIVE
Nitrite: NEGATIVE
Protein, ur: 100 mg/dL — AB
Specific Gravity, Urine: 1.022 (ref 1.005–1.030)
pH: 6 (ref 5.0–8.0)

## 2020-02-04 LAB — CBC
HCT: 42.3 % (ref 39.0–52.0)
Hemoglobin: 13.8 g/dL (ref 13.0–17.0)
MCH: 28.6 pg (ref 26.0–34.0)
MCHC: 32.6 g/dL (ref 30.0–36.0)
MCV: 87.6 fL (ref 80.0–100.0)
Platelets: 214 10*3/uL (ref 150–400)
RBC: 4.83 MIL/uL (ref 4.22–5.81)
RDW: 13.7 % (ref 11.5–15.5)
WBC: 5.7 10*3/uL (ref 4.0–10.5)
nRBC: 0 % (ref 0.0–0.2)

## 2020-02-04 LAB — PROTEIN / CREATININE RATIO, URINE
Creatinine, Urine: 69.27 mg/dL
Protein Creatinine Ratio: 2.15 mg/mg{Cre} — ABNORMAL HIGH (ref 0.00–0.15)
Total Protein, Urine: 149 mg/dL

## 2020-02-04 MED ORDER — SODIUM CHLORIDE 0.9 % IV SOLN
612.5000 mg | INTRAVENOUS | Status: DC
  Administered 2020-02-04: 612.5 mg via INTRAVENOUS
  Filled 2020-02-04: qty 612.5

## 2020-02-06 LAB — CMV DNA, QUANTITATIVE, PCR
CMV DNA Quant: NEGATIVE IU/mL
Log10 CMV Qn DNA Pl: UNDETERMINED log10 IU/mL

## 2020-02-06 LAB — BK QUANT PCR (PLASMA/SERUM)
BK Quantitaion PCR: NEGATIVE copies/mL
Log10 BK Qn PCR: UNDETERMINED log10copy/mL

## 2020-02-12 ENCOUNTER — Other Ambulatory Visit (INDEPENDENT_AMBULATORY_CARE_PROVIDER_SITE_OTHER): Payer: Medicare Other

## 2020-02-12 ENCOUNTER — Other Ambulatory Visit: Payer: Self-pay

## 2020-02-12 DIAGNOSIS — E1165 Type 2 diabetes mellitus with hyperglycemia: Secondary | ICD-10-CM

## 2020-02-12 DIAGNOSIS — Z794 Long term (current) use of insulin: Secondary | ICD-10-CM

## 2020-02-12 DIAGNOSIS — E291 Testicular hypofunction: Secondary | ICD-10-CM

## 2020-02-12 LAB — COMPREHENSIVE METABOLIC PANEL
ALT: 20 U/L (ref 0–53)
AST: 15 U/L (ref 0–37)
Albumin: 4.4 g/dL (ref 3.5–5.2)
Alkaline Phosphatase: 87 U/L (ref 39–117)
BUN: 27 mg/dL — ABNORMAL HIGH (ref 6–23)
CO2: 29 mEq/L (ref 19–32)
Calcium: 10.5 mg/dL (ref 8.4–10.5)
Chloride: 105 mEq/L (ref 96–112)
Creatinine, Ser: 1.72 mg/dL — ABNORMAL HIGH (ref 0.40–1.50)
GFR: 40.66 mL/min — ABNORMAL LOW (ref >60.00)
Glucose, Bld: 141 mg/dL — ABNORMAL HIGH (ref 70–99)
Potassium: 4.3 mEq/L (ref 3.5–5.1)
Sodium: 138 mEq/L (ref 135–145)
Total Bilirubin: 0.8 mg/dL (ref 0.2–1.2)
Total Protein: 6.5 g/dL (ref 6.0–8.3)

## 2020-02-12 LAB — HEMOGLOBIN A1C: Hgb A1c MFr Bld: 8.7 % — ABNORMAL HIGH (ref 4.6–6.5)

## 2020-02-12 LAB — TESTOSTERONE: Testosterone: 241.91 ng/dL — ABNORMAL LOW (ref 300.00–890.00)

## 2020-02-12 LAB — TSH: TSH: 1.33 u[IU]/mL (ref 0.35–4.50)

## 2020-02-12 LAB — CBC
HCT: 42.2 % (ref 39.0–52.0)
Hemoglobin: 14 g/dL (ref 13.0–17.0)
MCHC: 33.1 g/dL (ref 30.0–36.0)
MCV: 85.1 fl (ref 78.0–100.0)
Platelets: 244 10*3/uL (ref 150.0–400.0)
RBC: 4.96 Mil/uL (ref 4.22–5.81)
RDW: 15 % (ref 11.5–15.5)
WBC: 7 10*3/uL (ref 4.0–10.5)

## 2020-02-12 LAB — T4, FREE: Free T4: 1.18 ng/dL (ref 0.60–1.60)

## 2020-02-13 LAB — FRUCTOSAMINE: Fructosamine: 367 umol/L — ABNORMAL HIGH (ref 0–285)

## 2020-02-16 ENCOUNTER — Ambulatory Visit: Payer: Medicare Other | Admitting: Endocrinology

## 2020-02-16 DIAGNOSIS — Z0289 Encounter for other administrative examinations: Secondary | ICD-10-CM

## 2020-02-16 NOTE — Progress Notes (Deleted)
Patient ID: Robert [name], male   DOB: 06-05-1961, 59 y.o.   MRN: [id number]   I connected with the above-named patient by video enabled telemedicine application and verified that I am speaking with the correct person. The patient was explained the limitations of evaluation and management by telemedicine and the availability of in person appointments.  Patient also understood that there may be a patient responsible charge related to this service . Location of the patient: Patient's home . Location of the provider: Physician office Only the patient and myself were participating in the encounter The patient understood the above statements and agreed to proceed.    Reason for Appointment: Endocrinology follow-up  History of Present Illness   DIABETES diagnosis date:  2012  Previous history: His blood sugar has been difficult to control since onset. Initially was taking oral hypoglycemic drugs like glyburide but could not take metformin because of renal dysfunction. Since Victoza did not help his control he was started on Lantus and then U 500 insulin also His A1c has previously ranged from 7.8-9.8, the lowest level in 03/2013 He was on Victoza but because of his markedly decreased appetite and weight loss this was stopped in 9/15  Recent history:   Insulin regimen: Tresiba 41 units daily., Humulin U-500 before meals 15 units Previously following 1: 5 carbohydrate ratio  Non-hypoglycemic insulin regimen: Victoza 1.8 mg daily  His A1c is still relatively high at 7.2  Highest was 7.8 done in October Fructosamine previously was 281, generally about the same, no recent labs available  Current blood sugar patterns, management and problems identified:    His blood sugars appear to be much higher than before with review of his home readings  He thinks that this is from irregular use of his insulin as well as poor diet  He is still not motivated to check his sugars and has  only 7 readings in the last month or so  He would probably does take his [name] regularly but has not change it even with his fasting blood sugars as high as 499  He is trying to take his Humulin R insulin but frequently missing the doses, also he does not want to increase the dose over 15 units because he thinks that will cause low sugars, however the lowest blood sugar recently is only 215  Lab glucose earlier this month was 287  Also has not checked his sugar 4 times a day as recommended to be eligible for a CGM such as Dexcom  Also cannot afford co-pay for the OmniPod pump  His weight appears to be about the same recently   Proper timing of medications in relation to meals: Yes.          Monitors blood glucose:  recently 1-2 times a day .    Glucometer: Accu-Chek         Blood Glucose readings by meter reviewed   PRE-MEAL Fasting Lunch Dinner Bedtime Overall  Glucose range:  278-499   331    Mean/median:     ?   POST-MEAL PC Breakfast PC Lunch  8-10 PM  Glucose range:    215, 313  Mean/median:      Previous sugars:  PRE-MEAL Fasting Lunch  PC lunch Bedtime Overall  Glucose range: 107-285  203-317  165    Mean/median:     212     Meals: 2-3 meals per day. 7-8 am and Noon and [employer] 5-7 PM .  Physical activity: exercise: yard work       Weight control: No  Wt Readings from Last 3 Encounters:  02/04/20 293 lb 6.4 oz (133.1 kg)  01/07/20 293 lb 9.6 oz (133.2 kg)  12/10/19 294 lb (591.6 kg)        Complications:   nephropathy, neuropathy   Diabetes labs:   Lab Results  Component Value Date   HGBA1C 8.7 (H) 02/12/2020   HGBA1C 7.2 (H) 08/03/2019   HGBA1C 6.4 07/07/2019   Lab Results  Component Value Date   MICROALBUR 146.3 (H) 08/03/2019   LDLCALC 92 07/07/2019   CREATININE 1.72 (H) 02/12/2020    Lab Results  Component Value Date   FRUCTOSAMINE 367 (H) 02/12/2020   FRUCTOSAMINE 281 04/29/2019   FRUCTOSAMINE 278 09/17/2018     Lab on  02/12/2020  Component Date Value Ref Range Status  . Fructosamine 02/12/2020 367* 0 - 285 umol/L Final   Comment: Published reference interval for apparently healthy subjects between age 31 and 32 is 32 - 285 umol/L and in a poorly controlled diabetic population is 228 - 563 umol/L with a mean of 396 umol/L.   Marland Kitchen WBC 02/12/2020 7.0  4.0 - 10.5 K/uL Final  . RBC 02/12/2020 4.96  4.22 - 5.81 Mil/uL Final  . Platelets 02/12/2020 244.0  150.0 - 400.0 K/uL Final  . Hemoglobin 02/12/2020 14.0  13.0 - 17.0 g/dL Final  . HCT 02/12/2020 42.2  39.0 - 52.0 % Final  . MCV 02/12/2020 85.1  78.0 - 100.0 fl Final  . MCHC 02/12/2020 33.1  30.0 - 36.0 g/dL Final  . RDW 02/12/2020 15.0  11.5 - 15.5 % Final  . Testosterone 02/12/2020 241.91* 300.00 - 890.00 ng/dL Final  . Free T4 02/12/2020 1.18  0.60 - 1.60 ng/dL Final   Comment: Specimens from patients who are undergoing biotin therapy and /or ingesting biotin supplements may contain high levels of biotin.  The higher biotin concentration in these specimens interferes with this Free T4 assay.  Specimens that contain high levels  of biotin may cause false high results for this Free T4 assay.  Please interpret results in light of the total clinical presentation of the patient.    Marland Kitchen TSH 02/12/2020 1.33  0.35 - 4.50 uIU/mL Final  . Sodium 02/12/2020 138  135 - 145 mEq/L Final  . Potassium 02/12/2020 4.3  3.5 - 5.1 mEq/L Final  . Chloride 02/12/2020 105  96 - 112 mEq/L Final  . CO2 02/12/2020 29  19 - 32 mEq/L Final  . Glucose, Bld 02/12/2020 141* 70 - 99 mg/dL Final  . BUN 02/12/2020 27* 6 - 23 mg/dL Final  . Creatinine, Ser 02/12/2020 1.72* 0.40 - 1.50 mg/dL Final  . Total Bilirubin 02/12/2020 0.8  0.2 - 1.2 mg/dL Final  . Alkaline Phosphatase 02/12/2020 87  39 - 117 U/L Final  . AST 02/12/2020 15  0 - 37 U/L Final  . ALT 02/12/2020 20  0 - 53 U/L Final  . Total Protein 02/12/2020 6.5  6.0 - 8.3 g/dL Final  . Albumin 02/12/2020 4.4  3.5 - 5.2 g/dL  Final  . GFR 02/12/2020 40.66* >60.00 mL/min Final  . Calcium 02/12/2020 10.5  8.4 - 10.5 mg/dL Final  . Hgb A1c MFr Bld 02/12/2020 8.7* 4.6 - 6.5 % Final   Glycemic Control Guidelines for People with Diabetes:Non Diabetic:  <6%Goal of Therapy: <7%Additional Action Suggested:  >8%     OTHER problems: See review of systems  Allergies as of 02/16/2020   No Known Allergies     Medication List       Accurate as of Feb 16, 2020  1:00 PM. If you have any questions, ask your nurse or doctor.        Accu-Chek [employer] Kit 1 each by Does not apply route daily.   Accu-Chek [employer] Lancets Misc Use lancets to check blood sugar 5 times daily.   amLODipine 10 MG tablet Commonly known as: NORVASC Take 1 tablet (10 mg total) by mouth daily.   aspirin EC 81 MG tablet Take 81 mg by mouth daily.   B-D UF III MINI PEN NEEDLES 31G X 5 MM Misc Generic drug: Insulin Pen Needle USE 4 PER DAY TO INJECT INSULIN.   DULoxetine 30 MG capsule Commonly known as: CYMBALTA Take 30 mg by mouth daily.   Fifty50 Glucose Meter 2.0 w/Device Kit Use as directed   Accu-Chek Guide w/Device Kit 1 each by Does not apply route daily. Use meter to check blood sugar 5 times daily.   fluocinonide cream 0.05 % Commonly known as: LIDEX Apply 1 application topically daily as needed (Skin inflammation (hand, thigh, back)).   furosemide 20 MG tablet Commonly known as: LASIX Take 40 mg by mouth daily as needed for edema.   gabapentin 100 MG capsule Commonly known as: NEURONTIN Take 200-300 mg by mouth See admin instructions. 200 mg in the morning and 300 mg at bedtime   glucose blood test strip Commonly known as: Accu-Chek Guide 1 each by Other route as needed for other. Use as instructed to check blood sugar 5 times daily.   HUMULIN R 500 UNIT/ML injection Generic drug: insulin regular human CONCENTRATED Using a U-100 1cc/31G/5/16" INSULIN SYRINGE, draw up and inject 5-18 units into the skin  three times a day 30 minutes prior to each meal   Insulin Syringe-Needle U-100 31G X 5/16" 0.3 ML Misc Commonly known as: BD Insulin Syringe U/F Use to inject insulin 2-3 times per day   BD Insulin Syringe U/F 31G X 5/16" 1 ML Misc Generic drug: Insulin Syringe-Needle U-100 USE 3 TIMES A DAY WITH HUMULIN R   lactulose 10 GM/15ML solution Commonly known as: CHRONULAC Take 10 g by mouth daily as needed.   levothyroxine 100 MCG tablet Commonly known as: SYNTHROID TAKE 1 TABLET BY MOUTH EVERY DAY   lisinopril 20 MG tablet Commonly known as: ZESTRIL Take 20 mg by mouth 2 (two) times a day. Take 1 tablet (26m total) by mouth twice daily.   metoprolol tartrate 50 MG tablet Commonly known as: LOPRESSOR TAKE 1 TABLET BY MOUTH TWICE A DAY   mirabegron ER 50 MG Tb24 tablet Commonly known as: MYRBETRIQ Take by mouth.   mycophenolate 250 MG capsule Commonly known as: CELLCEPT Take 1,000 mg by mouth every 12 (twelve) hours.   Nulojix 250 MG Solr injection Generic drug: belatacept Inject 24.5 mLs into the vein every 28 (twenty-eight) days.   predniSONE 5 MG tablet Commonly known as: DELTASONE Take 5 mg by mouth daily with breakfast.   rosuvastatin 10 MG tablet Commonly known as: CRESTOR Take 1 tablet (10 mg total) by mouth daily. WILL ONLY PROVIDE 90 DAY SUPPLY. MUST CALL TO SCHEDULE APPT.   senna-docusate 8.6-50 MG tablet Commonly known as: Senokot-S Take 1 tablet by mouth as needed for mild constipation.   tadalafil 5 MG tablet Commonly known as: CIALIS Take by mouth.   tamsulosin 0.4 MG Caps capsule Commonly known as: FLOMAX  Take 0.8 mg by mouth at bedtime.   Testosterone 20.25 MG/ACT (1.62%) Gel APPLY 3 PUMPS ON ONE ARM AND 2 ON THE OTHER EVERY MORNING ON SHOULDER AREA   traMADol 50 MG tablet Commonly known as: ULTRAM Take 1 tablet (50 mg total) by mouth every 6 (six) hours as needed. What changed:   how much to take  reasons to take this   traZODone 50 MG  tablet Commonly known as: DESYREL Take 50-100 mg by mouth at bedtime as needed for sleep.   [name] FlexTouch 100 UNIT/ML FlexTouch Pen Generic drug: insulin degludec Inject 54 units under the skin once daily.   UNABLE TO FIND BiPAP: At bedtime   Victoza 18 MG/3ML Sopn Generic drug: liraglutide Inject 0.3 mLs (1.8 mg total) into the skin daily.   zolpidem 10 MG tablet Commonly known as: AMBIEN Take 10-20 mg by mouth at bedtime as needed for sleep.       Allergies: No Known Allergies  Past Medical History:  Diagnosis Date  . Anemia    low hgb at present  . Arthritis    FEET  . Blood transfusion without reported diagnosis 1974   kidney removed - L  . Cancer ([place])    skin ca right shoulder, plastic dsyplasia, pre-Ca polpys removed on Colonoscopy- 07/2014  . Colon polyps 07/23/2014   Tubular adenomas x 5  . Constipation   . Diabetes mellitus without complication (HCC)    Type 2  . Difficult intubation    unsure of actual problem but it was during the January 28, 2015 procedure.  Marland Kitchen Dysrhythmia   . Eczema   . ESRD (end stage renal disease) ([place])    hEMO mwf  . Headache(784.0)    slight dull h/a due kidney failure  . Hyperlipidemia   . Hypertension    SVT h/o , followed by Dr. Claiborne Billings  . Hypothyroidism   . Macular degeneration   . Neuropathy    right hand and both feet  . PAT (paroxysmal atrial tachycardia) ([place])   . PSVT (paroxysmal supraventricular tachycardia) ([place])   . SBO (small bowel obstruction) ([place])   . Shortness of breath    with exertion  . Sleep apnea 06/18/11   split-night sleep study- [facility]., BiPAP- 13-16  . Thyroid disease   . Umbilical hernia    will repair with thi surgery    Past Surgical History:  Procedure Laterality Date  . AV FISTULA PLACEMENT Right 02/25/2015   Procedure: RIGHT ARTERIOVENOUS (AV) FISTULA CREATION;  Surgeon: Serafina Mitchell, MD;  Location: [name];  Service: Vascular;  Laterality: Right;  . AV  FISTULA PLACEMENT Right 09/15/2015   Procedure: ARTERIOVENOUS (AV) FISTULA CREATION- RIGHT ARM;  Surgeon: Serafina Mitchell, MD;  Location: [facility];  Service: Vascular;  Laterality: Right;  . CAPD INSERTION N/A 05/18/2014   Procedure: LAPAROSCOPIC INSERTION CONTINUOUS AMBULATORY PERITONEAL DIALYSIS  (CAPD) CATHETER;  Surgeon: Ralene Ok, MD;  Location: [facility];  Service: General;  Laterality: N/A;  . COLONOSCOPY    . COLONOSCOPY WITH PROPOFOL N/A 07/12/2016   Procedure: COLONOSCOPY WITH PROPOFOL;  Surgeon: [name], MD;  Location: [facility];  Service: Endoscopy;  Laterality: N/A;  . declotting of fistula  08/2015  . ESOPHAGOGASTRODUODENOSCOPY (EGD) WITH PROPOFOL N/A 07/12/2016   Procedure: ESOPHAGOGASTRODUODENOSCOPY (EGD) WITH PROPOFOL;  Surgeon: [name], MD;  Location: [facility];  Service: Endoscopy;  Laterality: N/A;  . FISTULA SUPERFICIALIZATION Right 02/07/2016   Procedure: RIIGHT UPPER ARM  FISTULA SUPERFICIALIZATION;  Surgeon: Elam Dutch, MD;  Location: [facility] OR;  Service: Vascular;  Laterality: Right;  . IJ catheter insertion    . INSERTION OF DIALYSIS CATHETER Right 02/25/2015   Procedure: INSERTION OF RIGHT INTERNAL JUGULAR DIALYSIS CATHETER;  Surgeon: Serafina Mitchell, MD;  Location: [facility];  Service: Vascular;  Laterality: Right;  . KIDNEY TRANSPLANT    . LAPAROSCOPIC REPOSITIONING CAPD CATHETER N/A 06/16/2014   Procedure: LAPAROSCOPIC REPOSITIONING CAPD CATHETER;  Surgeon: Ralene Ok, MD;  Location: [facility];  Service: General;  Laterality: N/A;  . LAPAROSCOPY N/A 05/04/2015   Procedure: LAPAROSCOPY DIAGNOSTIC LYSIS OF ADHESIONS;  Surgeon: Coralie Keens, MD;  Location: [facility];  Service: General;  Laterality: N/A;  . MINOR REMOVAL OF PERITONEAL DIALYSIS CATHETER N/A 04/28/2015   Procedure:  REMOVAL OF PERITONEAL DIALYSIS CATHETER;  Surgeon: Coralie Keens, MD;  Location: [facility];  Service: General;  Laterality: N/A;  . NEPHRECTOMY Left 1974  . RENAL BIOPSY Right 2012   . REVISON OF ARTERIOVENOUS FISTULA Right 05/26/2015   Procedure: SUPERFICIALIZATION OF ARTERIOVENOUS FISTULA WITH SIDE BRANCH LIGATIONS;  Surgeon: Serafina Mitchell, MD;  Location: [facility];  Service: Vascular;  Laterality: Right;  . SKIN CANCER EXCISION     right shoulder  . SVT ABLATION N/A 11/23/2016   Procedure: SVT Ablation;  Surgeon: Will Meredith Leeds, MD;  Location: [facility];  Service: Cardiovascular;  Laterality: N/A;  . UMBILICAL HERNIA REPAIR N/A 05/18/2014   Procedure: HERNIA REPAIR UMBILICAL ADULT;  Surgeon: Ralene Ok, MD;  Location: [facility];  Service: General;  Laterality: N/A;  . UMBILICAL HERNIA REPAIR N/A 04/28/2015   Procedure: UMBILICAL HERNIA REPAIR WITH MESH;  Surgeon: Coralie Keens, MD;  Location: [facility];  Service: General;  Laterality: N/A;    Family History  Problem Relation Age of Onset  . Colon polyps Father   . Colon cancer Neg Hx   . Stomach cancer Neg Hx     Social History:  reports that he quit smoking about 7 years ago. His smoking use included cigars. He has never used smokeless tobacco. He reports current alcohol use. He reports that he does not use drugs.  Review of Systems:   Hypertension:    His blood pressure has been managed by his nephrologist and transplant team Also monitors BP at home  BP Readings from Last 3 Encounters:  02/04/20 115/76  01/07/20 (!) 126/50  12/10/19 132/68    Lipids:  His LDL is below 100 with 10 mg Crestor Has no evidence of vascular disease    Lab Results  Component Value Date   CHOL 158 07/07/2019   CHOL 153 04/29/2019   CHOL 166 07/10/2018   Lab Results  Component Value Date   HDL 47 07/07/2019   HDL 54.30 04/29/2019   HDL 54.20 07/10/2018   Lab Results  Component Value Date   LDLCALC 92 07/07/2019   LDLCALC 80 04/29/2019   LDLCALC 95 07/10/2018   Lab Results  Component Value Date   TRIG 93 07/07/2019   TRIG 92.0 04/29/2019   TRIG 86.0 07/10/2018   Lab Results  Component Value Date    CHOLHDL 3 04/29/2019   CHOLHDL 3 07/10/2018   CHOLHDL 3 05/28/2017   No results found for: LDLDIRECT   HYPOTHYROIDISM: He has had long-standing hypothyroidism  Has been taking his 100 g levothyroxine before breakfast daily  Has not needed any adjustment of his dose for some time  TSH normal  Lab Results  Component  Value Date   TSH 1.33 02/12/2020       HYPOGONADISM: 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 metabolic syndrome and previously had subjectively improved with supplementation using Fortesta  He was having fatigue and decreased motivation and because of his free testosterone level being low at 3.7 he was prescribed AndroGel in February 2019 His testosterone level has been inconsistent  His testosterone level is most recently low because of taking his AndroGel inconsistently  Still complains of tiredness He says he is trying to take his AndroGel more regularly but is not able to take it every single day  Lab Results  Component Value Date   TESTOSTERONE 241.91 (L) 02/12/2020   TESTOSTERONE 243.59 (L) 08/03/2019   TESTOSTERONE 348.61 04/29/2019    He has depression but only taking 30 mg of Cymbalta; followed by PCP  Diabetic neuropathy: Has mild sensory loss on his last exam    Examination:   There were no vitals taken for this visit.  There is no height or weight on file to calculate BMI.    ASSESSMENT/ PLAN:    Diabetes type 2 with obesity, insulin-requiring:   See history of present illness for detailed discussion of his current management, problems identified and sugar patterns on his meter download  Currently on a regimen of Tresiba, Victoza and mealtime U-500 insulin with syringe  No recent labs available to assess his level of control  His blood sugars are being checked infrequently and are as high as 499 He is not taking his mealtime insulin regularly and also going off his diet Still not active at all and not  losing weight Currently not motivated to improve his daily routine He appears to be getting more insulin resistant also  Although he may benefit from using a CGM and OmniPod insulin pump he either does not qualify or cannot afford these   Recommendations:  Start monitoring blood sugars at least once or twice a day and preferably 4 times a day  Increase Tresiba to 54  He will go up another 4 units if blood sugars are still over 150 after 4 days  We will switch him to U-200 [name]  Discussed blood sugar targets of under 180 after meals and under 130 before breakfast  If his blood sugars are over 180 after meals he will need to at least go 2 to 4 units more on his Humulin R before his meals  Encouraged him to be better with his diet with cutting back on carbohydrates, portions and high-fat meals  Stay on 1.8 mg Victoza  Needs labs including A1c and fructosamine on the next visit, A1c appears to be falsely low in the past    HYPOGONADISM: He has tried to be a little more regular with his AndroGel but not taking this daily Last level was low because of forgetting to do it daily Encouraged him to take it consistently every day, this may improve his overall energy level also  Depression: Appears not controlled with low motivation, May benefit from 60 mg Cymbalta, he still has not discussed this with his PCP   Hypothyroidism: Has been controlled with normal TSH, recheck on the next visit  There are no Patient Instructions on file for this visit.  Follow-up in 2 months Elayne Snare 02/16/2020, 1:00 PM

## 2020-02-18 ENCOUNTER — Other Ambulatory Visit: Payer: Self-pay

## 2020-02-18 ENCOUNTER — Ambulatory Visit (INDEPENDENT_AMBULATORY_CARE_PROVIDER_SITE_OTHER): Payer: Medicare Other | Admitting: Endocrinology

## 2020-02-18 ENCOUNTER — Encounter: Payer: Self-pay | Admitting: Endocrinology

## 2020-02-18 VITALS — BP 156/88 | HR 78 | Ht 71.0 in | Wt 297.0 lb

## 2020-02-18 DIAGNOSIS — Z794 Long term (current) use of insulin: Secondary | ICD-10-CM

## 2020-02-18 DIAGNOSIS — E782 Mixed hyperlipidemia: Secondary | ICD-10-CM | POA: Diagnosis not present

## 2020-02-18 DIAGNOSIS — E1165 Type 2 diabetes mellitus with hyperglycemia: Secondary | ICD-10-CM | POA: Diagnosis not present

## 2020-02-18 DIAGNOSIS — E291 Testicular hypofunction: Secondary | ICD-10-CM | POA: Diagnosis not present

## 2020-02-18 DIAGNOSIS — E039 Hypothyroidism, unspecified: Secondary | ICD-10-CM | POA: Diagnosis not present

## 2020-02-18 NOTE — Progress Notes (Signed)
Patient ID: Robert [name], male   DOB: 23-Mar-1961, 59 y.o.   MRN: [id number]     Reason for Appointment: Endocrinology follow-up  History of Present Illness   DIABETES diagnosis date:  2012  Previous history: His blood sugar has been difficult to control since onset. Initially was taking oral hypoglycemic drugs like glyburide but could not take metformin because of renal dysfunction. Since Victoza did not help his control he was started on Lantus and then U 500 insulin also His A1c has previously ranged from 7.8-9.8, the lowest level in 03/2013 He was on Victoza but because of his markedly decreased appetite and weight loss this was stopped in 9/15  Recent history:   Insulin regimen: Tresiba 54 units daily in am., Humulin U-500 before meals 5-10 units Previously following 1: 5 carbohydrate ratio  Non-hypoglycemic insulin regimen: Victoza 1.8 mg daily  His A1c is higher at 8.7 Fructosamine 361, previously was 281,  Current blood sugar patterns, management and problems identified:    He has not been seen in follow-up since 12/20  Checking blood sugars only once or twice a day, mostly in the morning  Although his [name] was increased on the last visit his fasting readings are consistently high and mostly over 200  Not clear why he has reduced his regular insulin, previously taking 15 units at least  As before is not always taking his regular insulin before the meal  He thinks that some of his high readings may be related to higher carbohydrate meals or drinking cocktails  Usually not eating much at breakfast  Has only about 2 readings after supper at night, when it was over 300 at bedtime he took 20 units of regular insulin and got hypoglycemic the next morning at 5 AM  He has gained a little weight  This is despite trying to be active with some yard work  He takes [name] and Victoza regularly in the morning   Proper timing of medications in relation to  meals: Yes.          Monitors blood glucose:  recently 1-2 times a day .    Glucometer: Accu-Chek         Blood Glucose readings by meter download   PRE-MEAL Fasting Lunch Dinner Bedtime Overall  Glucose range:  199-330  117  112  217, 368   Mean/median:        POST-MEAL PC Breakfast PC Lunch PC Dinner  Glucose range:   370   Mean/median:      Previous readings:   PRE-MEAL Fasting Lunch Dinner Bedtime Overall  Glucose range:  278-499   331    Mean/median:     ?   POST-MEAL PC Breakfast PC Lunch  8-10 PM  Glucose range:    215, 313  Mean/median:      Previous sugars:  PRE-MEAL Fasting Lunch  PC lunch Bedtime Overall  Glucose range: 107-285  203-317  165    Mean/median:     212     Meals: 2-3 meals per day. 7-8 am and Noon and Dinner 5-7 PM .        Physical activity: exercise: yard work       Weight control: No  Wt Readings from Last 3 Encounters:  02/18/20 297 lb (134.7 kg)  02/04/20 293 lb 6.4 oz (133.1 kg)  01/07/20 293 lb 9.6 oz (448.1 kg)        Complications:   nephropathy, neuropathy   Diabetes labs:  Lab Results  Component Value Date   HGBA1C 8.7 (H) 02/12/2020   HGBA1C 7.2 (H) 08/03/2019   HGBA1C 6.4 07/07/2019   Lab Results  Component Value Date   MICROALBUR 146.3 (H) 08/03/2019   LDLCALC 92 07/07/2019   CREATININE 1.72 (H) 02/12/2020    Lab Results  Component Value Date   FRUCTOSAMINE 367 (H) 02/12/2020   FRUCTOSAMINE 281 04/29/2019   FRUCTOSAMINE 278 09/17/2018     Lab on 02/12/2020  Component Date Value Ref Range Status  . Fructosamine 02/12/2020 367* 0 - 285 umol/L Final   Comment: Published reference interval for apparently healthy subjects between age 57 and 40 is 66 - 285 umol/L and in a poorly controlled diabetic population is 228 - 563 umol/L with a mean of 396 umol/L.   Marland Kitchen WBC 02/12/2020 7.0  4.0 - 10.5 K/uL Final  . RBC 02/12/2020 4.96  4.22 - 5.81 Mil/uL Final  . Platelets 02/12/2020 244.0  150.0 - 400.0 K/uL Final   . Hemoglobin 02/12/2020 14.0  13.0 - 17.0 g/dL Final  . HCT 02/12/2020 42.2  39.0 - 52.0 % Final  . MCV 02/12/2020 85.1  78.0 - 100.0 fl Final  . MCHC 02/12/2020 33.1  30.0 - 36.0 g/dL Final  . RDW 02/12/2020 15.0  11.5 - 15.5 % Final  . Testosterone 02/12/2020 241.91* 300.00 - 890.00 ng/dL Final  . Free T4 02/12/2020 1.18  0.60 - 1.60 ng/dL Final   Comment: Specimens from patients who are undergoing biotin therapy and /or ingesting biotin supplements may contain high levels of biotin.  The higher biotin concentration in these specimens interferes with this Free T4 assay.  Specimens that contain high levels  of biotin may cause false high results for this Free T4 assay.  Please interpret results in light of the total clinical presentation of the patient.    Marland Kitchen TSH 02/12/2020 1.33  0.35 - 4.50 uIU/mL Final  . Sodium 02/12/2020 138  135 - 145 mEq/L Final  . Potassium 02/12/2020 4.3  3.5 - 5.1 mEq/L Final  . Chloride 02/12/2020 105  96 - 112 mEq/L Final  . CO2 02/12/2020 29  19 - 32 mEq/L Final  . Glucose, Bld 02/12/2020 141* 70 - 99 mg/dL Final  . BUN 02/12/2020 27* 6 - 23 mg/dL Final  . Creatinine, Ser 02/12/2020 1.72* 0.40 - 1.50 mg/dL Final  . Total Bilirubin 02/12/2020 0.8  0.2 - 1.2 mg/dL Final  . Alkaline Phosphatase 02/12/2020 87  39 - 117 U/L Final  . AST 02/12/2020 15  0 - 37 U/L Final  . ALT 02/12/2020 20  0 - 53 U/L Final  . Total Protein 02/12/2020 6.5  6.0 - 8.3 g/dL Final  . Albumin 02/12/2020 4.4  3.5 - 5.2 g/dL Final  . GFR 02/12/2020 40.66* >60.00 mL/min Final  . Calcium 02/12/2020 10.5  8.4 - 10.5 mg/dL Final  . Hgb A1c MFr Bld 02/12/2020 8.7* 4.6 - 6.5 % Final   Glycemic Control Guidelines for People with Diabetes:Non Diabetic:  <6%Goal of Therapy: <7%Additional Action Suggested:  >8%     OTHER problems: See review of systems    Allergies as of 02/18/2020   No Known Allergies     Medication List       Accurate as of Feb 18, 2020  2:05 PM. If you have any  questions, ask your nurse or doctor.        Accu-Chek [employer] Kit 1 each by Does not apply route daily.  Accu-Chek FastClix Lancets Misc Use lancets to check blood sugar 5 times daily.   amLODipine 10 MG tablet Commonly known as: NORVASC Take 1 tablet (10 mg total) by mouth daily.   aspirin EC 81 MG tablet Take 81 mg by mouth daily.   B-D UF III MINI PEN NEEDLES 31G X 5 MM Misc Generic drug: Insulin Pen Needle USE 4 PER DAY TO INJECT INSULIN.   DULoxetine 30 MG capsule Commonly known as: CYMBALTA Take 30 mg by mouth daily.   Fifty50 Glucose Meter 2.0 w/Device Kit Use as directed   Accu-Chek Guide w/Device Kit 1 each by Does not apply route daily. Use meter to check blood sugar 5 times daily.   fluocinonide cream 0.05 % Commonly known as: LIDEX Apply 1 application topically daily as needed (Skin inflammation (hand, thigh, back)).   furosemide 20 MG tablet Commonly known as: LASIX Take 40 mg by mouth daily as needed for edema.   gabapentin 100 MG capsule Commonly known as: NEURONTIN Take 200-300 mg by mouth See admin instructions. 200 mg in the morning and 300 mg at bedtime   glucose blood test strip Commonly known as: Accu-Chek Guide 1 each by Other route as needed for other. Use as instructed to check blood sugar 5 times daily.   HUMULIN R 500 UNIT/ML injection Generic drug: insulin regular human CONCENTRATED Using a U-100 1cc/31G/5/16" INSULIN SYRINGE, draw up and inject 5-18 units into the skin three times a day 30 minutes prior to each meal   Insulin Syringe-Needle U-100 31G X 5/16" 0.3 ML Misc Commonly known as: BD Insulin Syringe U/F Use to inject insulin 2-3 times per day   BD Insulin Syringe U/F 31G X 5/16" 1 ML Misc Generic drug: Insulin Syringe-Needle U-100 USE 3 TIMES A DAY WITH HUMULIN R   lactulose 10 GM/15ML solution Commonly known as: CHRONULAC Take 10 g by mouth daily as needed.   levothyroxine 100 MCG tablet Commonly known as:  SYNTHROID TAKE 1 TABLET BY MOUTH EVERY DAY   lisinopril 20 MG tablet Commonly known as: ZESTRIL Take 20 mg by mouth 2 (two) times a day. Take 1 tablet (61m total) by mouth twice daily.   metoprolol tartrate 50 MG tablet Commonly known as: LOPRESSOR TAKE 1 TABLET BY MOUTH TWICE A DAY   mirabegron ER 50 MG Tb24 tablet Commonly known as: MYRBETRIQ Take by mouth.   mycophenolate 250 MG capsule Commonly known as: CELLCEPT Take 1,000 mg by mouth every 12 (twelve) hours.   Nulojix 250 MG Solr injection Generic drug: belatacept Inject 24.5 mLs into the vein every 28 (twenty-eight) days.   predniSONE 5 MG tablet Commonly known as: DELTASONE Take 5 mg by mouth daily with breakfast.   rosuvastatin 10 MG tablet Commonly known as: CRESTOR Take 1 tablet (10 mg total) by mouth daily. WILL ONLY PROVIDE 90 DAY SUPPLY. MUST CALL TO SCHEDULE APPT.   senna-docusate 8.6-50 MG tablet Commonly known as: Senokot-S Take 1 tablet by mouth as needed for mild constipation.   tadalafil 5 MG tablet Commonly known as: CIALIS Take by mouth.   tamsulosin 0.4 MG Caps capsule Commonly known as: FLOMAX Take 0.8 mg by mouth at bedtime.   Testosterone 20.25 MG/ACT (1.62%) Gel APPLY 3 PUMPS ON ONE ARM AND 2 ON THE OTHER EVERY MORNING ON SHOULDER AREA   traMADol 50 MG tablet Commonly known as: ULTRAM Take 1 tablet (50 mg total) by mouth every 6 (six) hours as needed. What changed:   how much to  take  reasons to take this   traZODone 50 MG tablet Commonly known as: DESYREL Take 50-100 mg by mouth at bedtime as needed for sleep.   [name] FlexTouch 100 UNIT/ML FlexTouch Pen Generic drug: insulin degludec Inject 54 units under the skin once daily.   UNABLE TO FIND BiPAP: At bedtime   Victoza 18 MG/3ML Sopn Generic drug: liraglutide Inject 0.3 mLs (1.8 mg total) into the skin daily.   zolpidem 10 MG tablet Commonly known as: AMBIEN Take 10-20 mg by mouth at bedtime as needed for sleep.        Allergies: No Known Allergies  Past Medical History:  Diagnosis Date  . Anemia    low hgb at present  . Arthritis    FEET  . Blood transfusion without reported diagnosis 1974   kidney removed - L  . Cancer ([place])    skin ca right shoulder, plastic dsyplasia, pre-Ca polpys removed on Colonoscopy- 07/2014  . Colon polyps 07/23/2014   Tubular adenomas x 5  . Constipation   . Diabetes mellitus without complication (HCC)    Type 2  . Difficult intubation    unsure of actual problem but it was during the January 28, 2015 procedure.  Marland Kitchen Dysrhythmia   . Eczema   . ESRD (end stage renal disease) ([place])    hEMO mwf  . Headache(784.0)    slight dull h/a due kidney failure  . Hyperlipidemia   . Hypertension    SVT h/o , followed by Dr. Claiborne Billings  . Hypothyroidism   . Macular degeneration   . Neuropathy    right hand and both feet  . PAT (paroxysmal atrial tachycardia) ([place])   . PSVT (paroxysmal supraventricular tachycardia) ([place])   . SBO (small bowel obstruction) ([place])   . Shortness of breath    with exertion  . Sleep apnea 06/18/11   split-night sleep study- [facility]., BiPAP- 13-16  . Thyroid disease   . Umbilical hernia    will repair with thi surgery    Past Surgical History:  Procedure Laterality Date  . AV FISTULA PLACEMENT Right 02/25/2015   Procedure: RIGHT ARTERIOVENOUS (AV) FISTULA CREATION;  Surgeon: Serafina Mitchell, MD;  Location: [facility];  Service: Vascular;  Laterality: Right;  . AV FISTULA PLACEMENT Right 09/15/2015   Procedure: ARTERIOVENOUS (AV) FISTULA CREATION- RIGHT ARM;  Surgeon: Serafina Mitchell, MD;  Location: [facility];  Service: Vascular;  Laterality: Right;  . CAPD INSERTION N/A 05/18/2014   Procedure: LAPAROSCOPIC INSERTION CONTINUOUS AMBULATORY PERITONEAL DIALYSIS  (CAPD) CATHETER;  Surgeon: Ralene Ok, MD;  Location: [place];  Service: General;  Laterality: N/A;  . COLONOSCOPY    . COLONOSCOPY WITH PROPOFOL N/A 07/12/2016    Procedure: COLONOSCOPY WITH PROPOFOL;  Surgeon: [name], MD;  Location: [facility];  Service: Endoscopy;  Laterality: N/A;  . declotting of fistula  08/2015  . ESOPHAGOGASTRODUODENOSCOPY (EGD) WITH PROPOFOL N/A 07/12/2016   Procedure: ESOPHAGOGASTRODUODENOSCOPY (EGD) WITH PROPOFOL;  Surgeon: [name], MD;  [facility]: [facility];  Service: Endoscopy;  Laterality: N/A;  . FISTULA SUPERFICIALIZATION Right 02/07/2016   Procedure: RIIGHT UPPER ARM FISTULA SUPERFICIALIZATION;  Surgeon: Elam Dutch, MD;  Location: [facility] [place];  Service: Vascular;  Laterality: Right;  . IJ catheter insertion    . INSERTION OF DIALYSIS CATHETER Right 02/25/2015   Procedure: INSERTION OF RIGHT INTERNAL JUGULAR DIALYSIS CATHETER;  Surgeon: Serafina Mitchell, MD;  Location: [facility];  Service: Vascular;  Laterality: Right;  .  KIDNEY TRANSPLANT    . LAPAROSCOPIC REPOSITIONING CAPD CATHETER N/A 06/16/2014   Procedure: LAPAROSCOPIC REPOSITIONING CAPD CATHETER;  Surgeon: Ralene Ok, MD;  Location: [facility];  Service: General;  Laterality: N/A;  . LAPAROSCOPY N/A 05/04/2015   Procedure: LAPAROSCOPY DIAGNOSTIC LYSIS OF ADHESIONS;  Surgeon: Coralie Keens, MD;  Location: [facility];  Service: General;  Laterality: N/A;  . MINOR REMOVAL OF PERITONEAL DIALYSIS CATHETER N/A 04/28/2015   Procedure:  REMOVAL OF PERITONEAL DIALYSIS CATHETER;  Surgeon: Coralie Keens, MD;  Location: [facility];  Service: General;  Laterality: N/A;  . NEPHRECTOMY Left 1974  . RENAL BIOPSY Right 2012  . REVISON OF ARTERIOVENOUS FISTULA Right 05/26/2015   Procedure: SUPERFICIALIZATION OF ARTERIOVENOUS FISTULA WITH SIDE BRANCH LIGATIONS;  Surgeon: Serafina Mitchell, MD;  Location: [facility];  Service: Vascular;  Laterality: Right;  . SKIN CANCER EXCISION     right shoulder  . SVT ABLATION N/A 11/23/2016   Procedure: SVT Ablation;  Surgeon: Will Meredith Leeds, MD;  Location: [facility];  Service: Cardiovascular;  Laterality: N/A;  . UMBILICAL HERNIA  REPAIR N/A 05/18/2014   Procedure: HERNIA REPAIR UMBILICAL ADULT;  Surgeon: Ralene Ok, MD;  Location: [facility];  Service: General;  Laterality: N/A;  . UMBILICAL HERNIA REPAIR N/A 04/28/2015   Procedure: UMBILICAL HERNIA REPAIR WITH MESH;  Surgeon: Coralie Keens, MD;  [facility];  Service: General;  Laterality: N/A;    Family History  Problem Relation Age of Onset  . Colon polyps Father   . Colon cancer Neg Hx   . Stomach cancer Neg Hx     Social History:  reports that he quit smoking about 7 years ago. His smoking use included cigars. He has never used smokeless tobacco. He reports current alcohol use. He reports that he does not use drugs.  Review of Systems:   Hypertension:    His blood pressure has been managed by his nephrologist and transplant team monitors BP at home and it may be high at times also  BP Readings from Last 3 Encounters:  02/18/20 (!) 156/88  02/04/20 115/76  01/07/20 (!) 126/50    Lipids:  His LDL is below 100 with 10 mg Crestor Has no evidence of vascular disease    Lab Results  Component Value Date   CHOL 158 07/07/2019   CHOL 153 04/29/2019   CHOL 166 07/10/2018   Lab Results  Component Value Date   HDL 47 07/07/2019   HDL 54.30 04/29/2019   HDL 54.20 07/10/2018   Lab Results  Component Value Date   LDLCALC 92 07/07/2019   LDLCALC 80 04/29/2019   LDLCALC 95 07/10/2018   Lab Results  Component Value Date   TRIG 93 07/07/2019   TRIG 92.0 04/29/2019   TRIG 86.0 07/10/2018   Lab Results  Component Value Date   CHOLHDL 3 04/29/2019   CHOLHDL 3 07/10/2018   CHOLHDL 3 05/28/2017   No results found for: LDLDIRECT   HYPOTHYROIDISM: He has had long-standing hypothyroidism  Has been taking his 100 g levothyroxine before breakfast daily  On consistent dose  TSH normal  Lab Results  Component Value Date   TSH 1.33 02/12/2020       HYPOGONADISM: 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  metabolic syndrome and previously had subjectively improved with supplementation using [place]  He was having fatigue and decreased motivation and because of his free testosterone level being low at 3.7 he was prescribed AndroGel in February 2019 His  testosterone level has been inconsistent  Testosterone level is recently persistently low  Still complains of tiredness most of the time, also has had some depression He says he is trying to take his AndroGel on the days he is taking a shower and not understanding the need to take it every day.  He may take this 3 to 4 days a week  Lab Results  Component Value Date   TESTOSTERONE 241.91 (L) 02/12/2020   TESTOSTERONE 243.59 (L) 08/03/2019   TESTOSTERONE 348.61 04/29/2019    He has depression but only taking 30 mg of Cymbalta; followed by PCP  Diabetic neuropathy: Has mild sensory loss on his last exam    Examination:   BP (!) 156/88 (BP Location: Left Arm, Patient Position: Sitting, Cuff Size: Large)   Pulse 78   Ht _0  (1.803 m)   Wt 297 lb (134.7 kg)   SpO2 98%   BMI 41.42 kg/m   Body mass index is 41.42 kg/m.    ASSESSMENT/ PLAN:    Diabetes type 2 with obesity, insulin-requiring:   See history of present illness for detailed discussion of his current management, problems identified and sugar patterns on his meter download  Currently on a regimen of Tresiba, Victoza and mealtime U-500 insulin with syringe  A1c is higher at 8.7  He is getting more insulin resistant and also likely more insulin deficient He is taking relatively large amounts of basal insulin without adequate control of fasting readings also Likely needs more mealtime insulin and he does not understand the need to check blood sugars after meals to help adjust his mealtime dose On his own he is taking less Humulin R, previously taking an average of about 15 at least and now mostly 5-10 Diet can be better also including moderation of alcohol intake Not  losing weight despite taking Victoza  Although he may benefit from using a CGM and OmniPod insulin pump he either does not qualify or cannot afford these again Discussed use of freestyle libre but he does not think he can afford this   Recommendations:  Start monitoring blood sugars after meals on a regular basis especially after his main meal what ever time it is  Increase Tresiba to 60  He will go up further if fasting readings continue to be high  He will need to take an average of 20 units for his Humulin R insulin  Given him guidelines on adjusting the dose but needs to adjust based on meal size, carbohydrate intake and confirm with postprandial readings  Consistent exercise  Avoid high-fat meals and limit amounts of alcoholic drinks  Needs more regular follow-up   HYPOGONADISM: He was explained that his AndroGel will not last more than 24 hours and he will need to use this every day regardless This may help this energy level and motivation also  Depression: Not controlled again, May benefit from 60 mg Cymbalta Reminded him to call his PCP to discuss this   Hypothyroidism: Has been controlled with normal TSH, will continue same dose  Patient Instructions  Humulin R 10 units for very small meals, 20 units for avg meals and 25 for large meals, high carb or sugar foods  Tresiba 60 daily  Take androgel 7/7 days     Follow-up in 2 months  Elayne Snare 02/18/2020, 2:05 PM

## 2020-02-18 NOTE — Patient Instructions (Addendum)
Humulin R 10 units for very small meals, 20 units for avg meals and 25 for large meals, high carb or sugar foods  Tresiba 60 daily  Take androgel 7/7 days  Ask about 60mg  Cymbalta  Check blood sugars on waking up 7 days a week  Also check blood sugars about 2 hours after meals and do this after different meals by rotation  Recommended blood sugar levels on waking up are 90-130 and about 2 hours after meal is 130-180  Please bring your blood sugar monitor to each visit, thank you

## 2020-03-01 ENCOUNTER — Telehealth: Payer: Self-pay

## 2020-03-01 NOTE — Telephone Encounter (Signed)
[id number]. Pt has VV on 03/11/20 with Dr.kelly and Dr.Kelly will be in office that afternoon. Wanted to offer pt an in-person visit if he preferred. If not, okay to keep the VV that day.

## 2020-03-02 ENCOUNTER — Encounter (HOSPITAL_COMMUNITY): Payer: Medicare Other

## 2020-03-03 ENCOUNTER — Other Ambulatory Visit: Payer: Self-pay

## 2020-03-03 ENCOUNTER — Ambulatory Visit (HOSPITAL_COMMUNITY)
Admission: RE | Admit: 2020-03-03 | Discharge: 2020-03-03 | Disposition: A | Discharge status: Home / Self Care | Payer: Medicare Other | Source: Ambulatory Visit | Attending: Internal Medicine | Admitting: Internal Medicine

## 2020-03-03 DIAGNOSIS — Z1152 Encounter for screening for COVID-19: Secondary | ICD-10-CM | POA: Diagnosis not present

## 2020-03-03 DIAGNOSIS — Z4822 Encounter for aftercare following kidney transplant: Secondary | ICD-10-CM | POA: Insufficient documentation

## 2020-03-03 DIAGNOSIS — J019 Acute sinusitis, unspecified: Secondary | ICD-10-CM | POA: Diagnosis not present

## 2020-03-03 LAB — PROTEIN / CREATININE RATIO, URINE
Creatinine, Urine: 115.18 mg/dL
Protein Creatinine Ratio: 1.71 mg/mg{Cre} — ABNORMAL HIGH (ref 0.00–0.15)
Total Protein, Urine: 197 mg/dL

## 2020-03-03 LAB — COMPREHENSIVE METABOLIC PANEL
ALT: 23 U/L (ref 0–44)
AST: 21 U/L (ref 15–41)
Albumin: 3.8 g/dL (ref 3.5–5.0)
Alkaline Phosphatase: 73 U/L (ref 38–126)
Anion gap: 10 (ref 5–15)
BUN: 30 mg/dL — ABNORMAL HIGH (ref 6–20)
CO2: 20 mmol/L — ABNORMAL LOW (ref 22–32)
Calcium: 9.5 mg/dL (ref 8.9–10.3)
Chloride: 103 mmol/L (ref 98–111)
Creatinine, Ser: 2.09 mg/dL — ABNORMAL HIGH (ref 0.61–1.24)
GFR calc Af Amer: 39 mL/min — ABNORMAL LOW (ref >60)
GFR calc non Af Amer: 33 mL/min — ABNORMAL LOW (ref >60)
Glucose, Bld: 458 mg/dL — ABNORMAL HIGH (ref 70–99)
Potassium: 4.3 mmol/L (ref 3.5–5.1)
Sodium: 133 mmol/L — ABNORMAL LOW (ref 135–145)
Total Bilirubin: 0.9 mg/dL (ref 0.3–1.2)
Total Protein: 5.9 g/dL — ABNORMAL LOW (ref 6.5–8.1)

## 2020-03-03 LAB — URINALYSIS, COMPLETE (UACMP) WITH MICROSCOPIC
Bilirubin Urine: NEGATIVE
Glucose, UA: >=500 mg/dL — AB
Hgb urine dipstick: NEGATIVE
Ketones, ur: NEGATIVE mg/dL
Leukocytes,Ua: NEGATIVE
Nitrite: NEGATIVE
Protein, ur: >=300 mg/dL — AB
Specific Gravity, Urine: 1.026 (ref 1.005–1.030)
pH: 6 (ref 5.0–8.0)

## 2020-03-03 LAB — CBC
HCT: 44.4 % (ref 39.0–52.0)
Hemoglobin: 14.2 g/dL (ref 13.0–17.0)
MCH: 27.6 pg (ref 26.0–34.0)
MCHC: 32 g/dL (ref 30.0–36.0)
MCV: 86.4 fL (ref 80.0–100.0)
Platelets: 216 10*3/uL (ref 150–400)
RBC: 5.14 MIL/uL (ref 4.22–5.81)
RDW: 13.5 % (ref 11.5–15.5)
WBC: 9.6 10*3/uL (ref 4.0–10.5)
nRBC: 0 % (ref 0.0–0.2)

## 2020-03-03 MED ORDER — SODIUM CHLORIDE 0.9 % IV SOLN
612.5000 mg | INTRAVENOUS | Status: DC
  Administered 2020-03-03: 612.5 mg via INTRAVENOUS
  Filled 2020-03-03: qty 612.5

## 2020-03-04 LAB — BK QUANT PCR (PLASMA/SERUM)
BK Quantitaion PCR: NEGATIVE copies/mL
Log10 BK Qn PCR: UNDETERMINED log10copy/mL

## 2020-03-04 LAB — CMV DNA, QUANTITATIVE, PCR
CMV DNA Quant: NEGATIVE IU/mL
Log10 CMV Qn DNA Pl: UNDETERMINED log10 IU/mL

## 2020-03-08 ENCOUNTER — Ambulatory Visit (INDEPENDENT_AMBULATORY_CARE_PROVIDER_SITE_OTHER): Payer: BC Managed Care – PPO | Admitting: Podiatry

## 2020-03-08 ENCOUNTER — Encounter: Payer: Self-pay | Admitting: Podiatry

## 2020-03-08 ENCOUNTER — Other Ambulatory Visit: Payer: Self-pay

## 2020-03-08 DIAGNOSIS — B351 Tinea unguium: Secondary | ICD-10-CM

## 2020-03-08 DIAGNOSIS — M79675 Pain in left toe(s): Secondary | ICD-10-CM | POA: Diagnosis not present

## 2020-03-08 DIAGNOSIS — R234 Changes in skin texture: Secondary | ICD-10-CM

## 2020-03-08 DIAGNOSIS — E1149 Type 2 diabetes mellitus with other diabetic neurological complication: Secondary | ICD-10-CM

## 2020-03-08 DIAGNOSIS — M79674 Pain in right toe(s): Secondary | ICD-10-CM | POA: Diagnosis not present

## 2020-03-08 HISTORY — DX: Changes in skin texture: R23.4

## 2020-03-08 NOTE — Progress Notes (Signed)
This patient returns to my office for at risk foot care.  This patient requires this care by a professional since this patient will be at risk due to having uncontrolled diabetes and kidney transplant.  This patient is unable to cut nails himself since the patient cannot reach his nails.These nails are painful walking and wearing shoes. He says he has also developed break of his heel skin left foot. This patient presents for at risk foot care today.  General Appearance  Alert, conversant and in no acute stress.  Vascular  Dorsalis pedis and posterior tibial  pulses are palpable  bilaterally.  Capillary return is within normal limits  bilaterally. Temperature is within normal limits  bilaterally.  Neurologic  Senn-Weinstein monofilament wire test diminished   bilaterally. Muscle power within normal limits bilaterally.  Nails Thick disfigured discolored nails with subungual debris  from hallux to fifth toes bilaterally. No evidence of bacterial infection or drainage bilaterally.  Orthopedic  No limitations of motion  feet .  No crepitus or effusions noted.  No bony pathology or digital deformities noted. HAV  B/L.  Skin  normotropic skin with no porokeratosis noted bilaterally.  No signs of infections or ulcers noted.   Fissures left heel.  Onychomycosis  Pain in right toes  Pain in left toes  Fissures left heel.  Consent was obtained for treatment procedures.   Mechanical debridement of nails 1-5  bilaterally performed with a nail nipper.  Filed with dremel without incident.  Told patient to use vaseline on his heel and chapstick as needed.   Return office visit  10 weeks                   Told patient to return for periodic foot care and evaluation due to potential at risk complications.   Gardiner Barefoot DPM

## 2020-03-10 ENCOUNTER — Other Ambulatory Visit: Payer: Self-pay | Admitting: Endocrinology

## 2020-03-11 ENCOUNTER — Ambulatory Visit: Payer: Medicare Other | Admitting: Podiatry

## 2020-03-11 ENCOUNTER — Telehealth (INDEPENDENT_AMBULATORY_CARE_PROVIDER_SITE_OTHER): Payer: Medicare Other | Admitting: Cardiovascular Disease

## 2020-03-11 ENCOUNTER — Encounter: Payer: Self-pay | Admitting: Cardiovascular Disease

## 2020-03-11 VITALS — BP 142/72 | HR 69 | Ht 71.0 in | Wt 287.0 lb

## 2020-03-11 DIAGNOSIS — G4733 Obstructive sleep apnea (adult) (pediatric): Secondary | ICD-10-CM | POA: Diagnosis not present

## 2020-03-11 DIAGNOSIS — I10 Essential (primary) hypertension: Secondary | ICD-10-CM

## 2020-03-11 DIAGNOSIS — Z940 Kidney transplant status: Secondary | ICD-10-CM | POA: Diagnosis not present

## 2020-03-11 DIAGNOSIS — E118 Type 2 diabetes mellitus with unspecified complications: Secondary | ICD-10-CM | POA: Diagnosis not present

## 2020-03-11 DIAGNOSIS — E785 Hyperlipidemia, unspecified: Secondary | ICD-10-CM | POA: Diagnosis not present

## 2020-03-11 DIAGNOSIS — Z794 Long term (current) use of insulin: Secondary | ICD-10-CM | POA: Diagnosis not present

## 2020-03-11 DIAGNOSIS — E039 Hypothyroidism, unspecified: Secondary | ICD-10-CM

## 2020-03-11 DIAGNOSIS — Z9989 Dependence on other enabling machines and devices: Secondary | ICD-10-CM

## 2020-03-11 DIAGNOSIS — I350 Nonrheumatic aortic (valve) stenosis: Secondary | ICD-10-CM | POA: Diagnosis not present

## 2020-03-11 MED ORDER — AMLODIPINE BESYLATE 10 MG PO TABS: 10.0000 mg | ORAL_TABLET | Freq: Every day | ORAL | 3 refills | Status: DC

## 2020-03-11 NOTE — Progress Notes (Signed)
Virtual Visit via Video Note   This visit type was conducted due to national recommendations for restrictions regarding the COVID-19 Pandemic (e.g. social distancing) in an effort to limit this patient's exposure and mitigate transmission in our community.  Due to his co-morbid illnesses, this patient is at least at moderate risk for complications without adequate follow up.  This format is felt to be most appropriate for this patient at this time.  All issues noted in this document were discussed and addressed.  A limited physical exam was performed with this format.  Please refer to the patient's chart for his consent to telehealth for [facility].   The patient was identified using 2 identifiers.  Date:  03/13/2020   ID:  Robert [name], DOB 1961/06/27, MRN [id number]  Patient Location: Home Provider Location: Office  PCP:  Haywood Pao, MD  Cardiologist:  Shelva Majestic, MD Electrophysiologist:  None   Evaluation Performed:  Follow-Up Visit  Chief Complaint:  4 month F/U  History of Present Illness:    Robert [name] is a 59 y.o. male who is a former patient of Dr. Terance Ice. He presents for a 4 month follow-up evaluation.  Robert [name]  has a complex medical history that includes a congenital nonfunctioning left kidney for which he underwent left nephrectomy at age 62. He has a history of obesity, diabetes mellitus, and has been demonstrated to have proteinuria of his remaining single kidney, being status post open renal biopsy in [name] in November 2010. This revealed focal segmental glomerular sclerosis. At that time he was seen by Dr. Venita Sheffield in the nephrology department at [facility]. He sees Dr. Moshe Cipro in [place] for his kidney insufficiency and has been on ARB therapy in the past in an attempt to reduce proteinuria. He sees Dr. Dwyane Dee for his diabetes mellitus. He also has a history of hypothyroidism,  hyperlipidemia, anxiety and depression. He has a history of prior supraventricular tachycardia requiring hospitalization in 2007 as was felt to be possibly AV nodal reentrant rhythm.  He has a history of complex sleep apnea initially diagnosed in 2007 with an AHI at that time of 56.7 per hour and during REM sleep this increased to 65.7 per hour. At that time he dropped his oxygen 84% and had loud snoring. He had been on BiPAP therapy. He underwent a 5 year followup split-night study in 2012 which again confirmed severe sleep apnea with an AHI of 60.7 on the baseline portion of the study. He was unable to achieve REM sleep at that time. Oxygen saturation during non-REM sleep at 83%. He was titrated utilizing BiPAP up to a setting of 16/12 cm water pressure. He does admit to continued residual daytime sleepiness despite using his BiPAP therapy.  An echo Doppler study in August 2012 showed mild concentric LVH with normal systolic function and grade 1 diastolic dysfunction. He had trace mitral and tricuspid insufficiency as well as pulmonic insufficiency. A nuclear perfusion study in August 2012 showed normal perfusion.  Robert [name] was on dialysis Monday, [name].  He is on the [facility] transplant list.  In January 2017 he had an stress echo at [facility] as part of his transplant evaluation.  I do not have the results of this evaluation.  He has had some difficulty with a steal syndrome in his right hand since he had AV fistulas inserted in the forearm and most recently now in the upper arm.  He continues  to use BiPAP with 100% compliance.  He had a [name].  He obtains his CPAP supplies online because they are cheaper, but had seen [facility].  He is using a Art therapist full face mask.  A download in the office from 05/28/2016 through 07/26/2016.  He is meeting compliance standards with usage at 85% usage stays greater than 4 hours at 77%.  He is averaging 6 hours and 43  minutes per night.  He is set at an IPAP pressure of 16 and EPAP pressure of 12.  His AHI was mildly increased at 6.5 with 5.2 per hour of apneic events and 1.3 of hypopnea events.  When I last saw him he had stopped taken Bystolic since his blood pressure had become low at the end of dialysis.  He had been on 5 mg on nondialysis days.  He tells me his aspirin was discontinued because he was continuously having issues with bleeding following his dialysis when the catheter was removed.   He had an episode of SVT in December 2017, which was successfully aborted with vagal maneuvers.  In the emergency room, he had 2-3 more recurrences of SVT, of which, again, all aborted with vagal maneuvers.  He was started on verapamil and discharge from the hospital.  He has seen Dr. Curt Bears and had an EP study with attempted ablation, but apparently he was not able to be induced and ablation was not performed.   He denies any recurrent episodes of SVT.  Remotely, he may of had some mild nonexertional musculoskeletal discomfort but has not experienced any of this recently.  Reportedly, his remote stress echo was negative for ischemia, although mild ST changes were noted in recovery.  He continues to be asymptomatic.  He underwent a kidney transplant from a living donor on 02/25/2017 at [facility].  He tolerated surgery well but has developed a postoperative inguinal hernia, which will ultimately need to be surgically repaired.  He received a new BiPAP machine from [facility] 2 weeks ago.  He has a full face mask, but admits to increased leak.    Since I saw him in September 2018, he had 2 hospital stays at [facility].  He developed intestinal bleeding due to diverticular disease and also had some dehydration issues.  He also underwent inguinal hernia surgery.  He denies any recent chest pain or palpitations.  He continues to use BiPAP with 100% compliance.  However, he did not have his machine with him for the days he  was in the hospital.  He brought his machine with him today for interrogation.  Over the past month, usage hours and 6 hours and 32 minutes per night with an HI of 0.9.  He used to machine 27 of 30 nights nights that were not used was because he was in the hospital.  Usage greater than 4 hours was 26 of 30.  He is on a BiPAP pressure of 18/13 0.9.  Recently he has noticed a mild leak.  AHI was 5.6 over the past 7 days.  He has a ResMed fullface mask, Mirage Quatro mask.    I saw him in November 2018 at which time he was feeling well. He is seeing Dr. Azzie Glatter at [facility] from a nephrology standpoint.  He denies any episodes of chest pain or awareness of recurrent arrhythmia.  He admits to 100% use with CPAP.  [name] is his DME company.  He has a Animator V auto  unit with an EPAP pressure 14.  An IPAP pressure set at 18.  A download from 01/14/2017 through 02/12/2018 shows 100% compliance.  He is averaging 8 hours and 7 minutes of sleep.  AHI is excellent at 2.6.    I saw him in May 2019 and since that time, he has continued to follow at [facility]. He was last evaluated by me in a telemedicine visit in April 2020.  At that time he had noticed that over the past year  his blood pressure had increased and his lisinopril dose was increased to 20 mg and amlodipine reduced to 5 mg. His weight had fluctuated and has increased from 293 to 303. He has continued to use BiPAP with 100% compliance. I was able to obtain a download today from March 23 through January 27, 2019. He is 100% compliant with average BiPAP use of 7 hours and 10 minutes. At his 18/14 pressure, AHI remains excellent at 2.6/h. Over the past 6 months he has changed jobs twice. He is been staying home particularly with his significant comorbidities which can be affected by the coronavirus including hypertension, diabetes mellitus, obesity, renal disease, and immunosuppression. He walks approximately 2 times a day for short duration. He  receives immunosuppressant infusion with [employer] and is scheduled for his next infusion: On Feb 12, 2019. His last lipid panel was in October 2019 with total cholesterol 137andLDL cholesterol 73.He is scheduled to see his nephrologist in August.  I last saw him in the office in February 2021 and since his  telemedicine evaluation in April 2020, he continued to be followed closely at [facility].  Creatinine has increased to 1.74.  He has noticed blood pressure elevation.  He has not been successful with weight loss.  He has been without anginal symptoms or awareness of recurrent SVT.  He has continued to use BiPAP therapy.  A download from 10/22/2019 through 11/20/1999 shows 93% of usage days.  Average use is 8 hours and 10 minutes.  At his pressure of 18/14, AHI is excellent at 1.5.  There is no mask leak.  During his February 2021 evaluation he had a 2/6 murmur consistent with aortic stenosis and I recommended follow-up echo Doppler evaluation.  He underwent an echo Doppler study on December 08, 2019 which showed an EF of 65 to 70%, moderate left ventricular hypertrophy and grade 1 diastolic dysfunction.  There was mild mitral valve regurgitation.  There was moderate to severe aortic stenosis with a mean gradient of 39 peak gradient of 58.  Presently he feels well.  He denies chest pain.  He denies presyncope or syncope.  He continues to go to [facility] for nephrology follow-up.  He states his blood pressure is stable every morning upon awakening but as a day progresses it may go up slightly.  He denies any exertional shortness of breath.  Times he does note some mild lower extremity edema.   The patient does not have symptoms concerning for COVID-19 infection (fever, chills, cough, or new shortness of breath).    Past Medical History:  Diagnosis Date  . Anemia    low hgb at present  . Arthritis    FEET  . Blood transfusion without reported diagnosis 1974   kidney removed - L  . Cancer ([place])    skin ca  right shoulder, plastic dsyplasia, pre-Ca polpys removed on Colonoscopy- 07/2014  . Colon polyps 07/23/2014   Tubular adenomas x 5  . Constipation   . Diabetes mellitus without complication (  [place])    Type 2  . Difficult intubation    unsure of actual problem but it was during the January 28, 2015 procedure.  Marland Kitchen Dysrhythmia   . Eczema   . ESRD (end stage renal disease) ([place])    hEMO mwf  . Headache(784.0)    slight dull h/a due kidney failure  . Hyperlipidemia   . Hypertension    SVT h/o , followed by Dr. Claiborne Billings  . Hypothyroidism   . Macular degeneration   . Neuropathy    right hand and both feet  . PAT (paroxysmal atrial tachycardia) ([place])   . PSVT (paroxysmal supraventricular tachycardia) ([place])   . SBO (small bowel obstruction) ([place])   . Shortness of breath    with exertion  . Sleep apnea 06/18/11   split-night sleep study- [facility]., BiPAP- 13-16  . Thyroid disease   . Umbilical hernia    will repair with thi surgery   Past Surgical History:  Procedure Laterality Date  . AV FISTULA PLACEMENT Right 02/25/2015   Procedure: RIGHT ARTERIOVENOUS (AV) FISTULA CREATION;  Surgeon: Serafina Mitchell, MD;  Location: [place];  Service: Vascular;  Laterality: Right;  . AV FISTULA PLACEMENT Right 09/15/2015   Procedure: ARTERIOVENOUS (AV) FISTULA CREATION- RIGHT ARM;  Surgeon: Serafina Mitchell, MD;  Location: [place];  Service: Vascular;  Laterality: Right;  . CAPD INSERTION N/A 05/18/2014   Procedure: LAPAROSCOPIC INSERTION CONTINUOUS AMBULATORY PERITONEAL DIALYSIS  (CAPD) CATHETER;  Surgeon: Ralene Ok, MD;  Location: [place];  Service: General;  Laterality: N/A;  . COLONOSCOPY    . COLONOSCOPY WITH PROPOFOL N/A 07/12/2016   Procedure: COLONOSCOPY WITH PROPOFOL;  Surgeon: [name], MD;  Location: [facility];  Service: Endoscopy;  Laterality: N/A;  . declotting of fistula  08/2015  . ESOPHAGOGASTRODUODENOSCOPY (EGD) WITH PROPOFOL N/A 07/12/2016   Procedure:  ESOPHAGOGASTRODUODENOSCOPY (EGD) WITH PROPOFOL;  Surgeon: [name], MD;  [facility];  Service: Endoscopy;  Laterality: N/A;  . FISTULA SUPERFICIALIZATION Right 02/07/2016   Procedure: RIIGHT UPPER ARM FISTULA SUPERFICIALIZATION;  Surgeon: Elam Dutch, MD;  Location: [place];  Service: Vascular;  Laterality: Right;  . IJ catheter insertion    . INSERTION OF DIALYSIS CATHETER Right 02/25/2015   Procedure: INSERTION OF RIGHT INTERNAL JUGULAR DIALYSIS CATHETER;  Surgeon: Serafina Mitchell, MD;  Location: [place];  Service: Vascular;  Laterality: Right;  . KIDNEY TRANSPLANT    . LAPAROSCOPIC REPOSITIONING CAPD CATHETER N/A 06/16/2014   Procedure: LAPAROSCOPIC REPOSITIONING CAPD CATHETER;  Surgeon: Ralene Ok, MD;  Location: [place];  Service: General;  Laterality: N/A;  . LAPAROSCOPY N/A 05/04/2015   Procedure: LAPAROSCOPY DIAGNOSTIC LYSIS OF ADHESIONS;  Surgeon: Coralie Keens, MD;  Location: [place];  Service: General;  Laterality: N/A;  . MINOR REMOVAL OF PERITONEAL DIALYSIS CATHETER N/A 04/28/2015   Procedure:  REMOVAL OF PERITONEAL DIALYSIS CATHETER;  Surgeon: Coralie Keens, MD;  Location: [place];  Service: General;  Laterality: N/A;  . NEPHRECTOMY Left 1974  . RENAL BIOPSY Right 2012  . REVISON OF ARTERIOVENOUS FISTULA Right 05/26/2015   Procedure: SUPERFICIALIZATION OF ARTERIOVENOUS FISTULA WITH SIDE BRANCH LIGATIONS;  Surgeon: Serafina Mitchell, MD;  Location: [place];  Service: Vascular;  Laterality: Right;  . SKIN CANCER EXCISION     right shoulder  . SVT ABLATION N/A 11/23/2016   Procedure: SVT Ablation;  Surgeon: Will Meredith Leeds, MD;  Location: [place] [facility];  Service: Cardiovascular;  Laterality: N/A;  . UMBILICAL  HERNIA REPAIR N/A 05/18/2014   Procedure: HERNIA REPAIR UMBILICAL ADULT;  Surgeon: Ralene Ok, MD;  Location: [place];  Service: General;  Laterality: N/A;  . UMBILICAL HERNIA REPAIR N/A 04/28/2015   Procedure: UMBILICAL HERNIA REPAIR WITH MESH;  Surgeon:  Coralie Keens, MD;  Location: [place];  Service: General;  Laterality: N/A;     No outpatient medications have been marked as taking for the 03/11/20 encounter (Telemedicine) with Troy Sine, MD.     Allergies:   Patient has no known allergies.   Social History   Tobacco Use  . Smoking status: Former Smoker    Types: Cigars    Quit date: 12/06/2012    Years since quitting: 7.2  . Smokeless tobacco: Never Used  Substance Use Topics  . Alcohol use: Yes    Comment: once/month  . Drug use: No     Family Hx: The patient's family history includes Colon polyps in his father. There is no history of Colon cancer or Stomach cancer.  ROS:   Please see the history of present illness.    No fevers chills night sweats Cough No dizziness, chest pain, syncope, or awareness of palpitations Mild trace to 1+ leg edema Positive obesity Continues to use BiPAP  All other systems reviewed and are negative.   Prior CV studies:   The following studies were reviewed today:  ECHO: 12/08/2019 IMPRESSIONS  1. Left ventricular ejection fraction, by estimation, is 65 to 70%. The  left ventricle has normal function. The left ventricle has no regional  wall motion abnormalities. There is moderate left ventricular hypertrophy.  Left ventricular diastolic  parameters are consistent with Grade I diastolic dysfunction (impaired  relaxation).  2. Right ventricular systolic function is normal. The right ventricular  size is normal.  3. The mitral valve is degenerative. Mild mitral valve regurgitation.  4. The aortic valve is abnormal. Aortic valve regurgitation is trivial.  Moderate to severe aortic valve stenosis. Aortic valve area, by VTI  measures 1.18 cm. Aortic valve mean gradient measures 39.0 mmHg. Aortic  valve Vmax measures 3.83 m/s. DI is  0.45.  5. Aortic dilatation noted. There is mild dilatation of the ascending  aorta measuring 40 mm.   Comparison(s): Prior images reviewed  side by side. Changes from prior  study are noted. 08/20/2018 - moderate AS -mean gradient 32 mmHg.   Labs/Other Tests and Data Reviewed:    EKG: No ECG was done today since this was a telemedicine visit.  I personally reviewed the ECG from November 20, 2019 which showed normal sinus rhythm at 68 bpm, Q-wave in lead III, mild inferior T wave abnormality.  Intervals were normal with QTc interval at 440 ms.  No ectopy.  Recent Labs: 02/12/2020: TSH 1.33 03/03/2020: ALT 23; BUN 30; Creatinine, Ser 2.09; Hemoglobin 14.2; Platelets 216; Potassium 4.3; Sodium 133   Recent Lipid Panel Lab Results  Component Value Date/Time   CHOL 158 07/07/2019 12:00 AM   TRIG 93 07/07/2019 12:00 AM   HDL 47 07/07/2019 12:00 AM   CHOLHDL 3 04/29/2019 08:48 AM   LDLCALC 92 07/07/2019 12:00 AM    Wt Readings from Last 3 Encounters:  03/11/20 287 lb (130.2 kg)  03/03/20 287 lb 4.8 oz (130.3 kg)  02/18/20 297 lb (134.7 kg)     Objective:    Vital Signs:  BP (!) 142/72   Pulse 69   Ht 5\' 11"  (1.803 m)   Wt 287 lb (130.2 kg)   BMI 40.03  kg/m    Since this was a video telemedicine encounter I could not physically examine the patient.  However he appeared visually similar in appearance. Breathing was normal and not labored Palpation of his heart rhythm revealed a regular rhythm There was no chest wall tenderness No abdominal tenderness Ankle feet swelling    ASSESSMENT & PLAN:    1. OSA on BIPAP   2. Essential hypertension   3. Aortic valve stenosis, etiology of cardiac valve disease unspecified   4. Hyperlipidemia with target LDL less than 70   5. Type 2 diabetes mellitus with complication, with long-term current use of insulin ([place])   6. Morbid obesity ([place])   7. History of renal transplantation   8. Hypothyroidism, unspecified type     Robert [name] is a 59 year old gentleman who has a history of morbid obesity, hypertension, diabetes mellitus, hyperlipidemia, and end-stage kidney disease.   Remotely he had undergone nephrectomy in 1974 and in May 2018 after having been on hemodialysis he underwent successful renal transplant from a living donor at [facility].  He has a history of hypertension and when last evaluated blood pressure was elevated and I recommended further titration of amlodipine to 10 mg daily.  He continues also to be on lisinopril 40 mg daily, metoprolol tartrate 50 mg twice a day.  He has recently noticed some mild leg swelling.  He has a prescription for furosemide but has not taken this.  He has a murmur consistent with aortic stenosis and his most recent echo Doppler study has demonstrated progression of disease which is now moderate to severe with a mean gradient of 39 increased from 32 mm in 2019.  He is asymptomatic with reference to chest pain, presyncope or syncope or significant heart failure symptoms.  He is on rosuvastatin 10 mg for hyperlipidemia.  He is followed at [facility] for his kidney disease.  4 weeks ago his creatinine was 1.72 which had increased to 2.09.  I am recommending follow-up chemistry and lipid studies.  If LDL remains greater than 70 rosuvastatin dose will need to be increased.  I suggested the addition of support stockings to control his mild lower extremity edema.  He will follow up with nephrology at [facility] and also with Dr. Dwyane Dee for his diabetes mellitus and hypothyroidism; he continues to be on Tresiba, Humulin insulin and Victoza for diabetes and levothyroxine 100 mcg for his hypothyroidism.  he continues to use BiPAP.  Download obtained from May 5 to March 10, 2020 confirms 100% compliant with average usage 7 hours and 25 minutes.  He has had a BiPAP pressure of 18/14.  AHI is excellent at 1.5.  I will see him in 6 months for reevaluation.  COVID-19 Education: The signs and symptoms of COVID-19 were discussed with the patient and how to seek care for testing (follow up with PCP or arrange E-visit).  The importance of social  distancing was discussed today.  Time:   Today, I have spent 25 minutes with the patient with telehealth technology discussing the above problems.     Medication Adjustments/Labs and Tests Ordered: Current medicines are reviewed at length with the patient today.  Concerns regarding medicines are outlined above.   Tests Ordered: Orders Placed This Encounter  Procedures  . Lipid panel  . Comprehensive metabolic panel    Medication Changes: Meds ordered this encounter  Medications  . amLODipine (NORVASC) 10 MG tablet    Sig: Take 1 tablet (10 mg total) by mouth  daily.    Dispense:  90 tablet    Refill:  3    Follow Up:  In Person 6 MONTHS  Signed, Shelva Majestic, MD  03/13/2020 5:08 PM    [place]

## 2020-03-11 NOTE — Patient Instructions (Addendum)
Medication Instructions:  CONTINUE WITH CURRENT MEDICATIONS. NO CHANGES.  *If you need a refill on your cardiac medications before your next appointment, please call your pharmacy*   Lab Work: [place] OF Robert [name]: LIPID CMET If you have labs (blood work) drawn today and your tests are completely normal, you will receive your results only by: Marland Kitchen MyChart Message (if you have MyChart) OR . A paper copy in the mail If you have any lab test that is abnormal or we need to change your treatment, we will call you to review the results.   Follow-Up: At [facility], you and your health needs are our priority.  As part of our continuing mission to provide you with exceptional heart care, we have created designated Provider Care Teams.  These Care Teams include your primary Cardiologist (physician) and Advanced Practice Providers (APPs -  Physician Assistants and Nurse Practitioners) who all work together to provide you with the care you need, when you need it.  We recommend signing up for the patient portal called "MyChart".  Sign up information is provided on this After Visit Summary.  MyChart is used to connect with patients for Virtual Visits (Telemedicine).  Patients are able to view lab/test results, encounter notes, upcoming appointments, etc.  Non-urgent messages can be sent to your provider as well.   To learn more about what you can do with MyChart, go to [email or web address].    Your next appointment:   6 month(s)  The format for your next appointment:   In Person  Provider:   Shelva Majestic, MD

## 2020-03-13 ENCOUNTER — Encounter: Payer: Self-pay | Admitting: Cardiovascular Disease

## 2020-03-15 ENCOUNTER — Other Ambulatory Visit: Payer: Self-pay | Admitting: Endocrinology

## 2020-03-15 ENCOUNTER — Telehealth: Payer: Self-pay

## 2020-03-15 NOTE — Telephone Encounter (Signed)
FAXED [place]: [place] [facility] [place] (faxed on 03/14/2020)  Document: Statement from certifying physician for therapeutic shoes. Other records requested: none at this time. Form was not sign and indicated that the pt must first have a foot exam performed by Dr. Dwyane Dee.  All above requested information has been faxed successfully to [employer] listed above. Documents and fax confirmation have been placed in the faxed file for future reference.

## 2020-03-15 NOTE — Telephone Encounter (Signed)
Please refill if appropriate

## 2020-03-17 ENCOUNTER — Other Ambulatory Visit: Payer: Self-pay | Admitting: Endocrinology

## 2020-03-31 ENCOUNTER — Inpatient Hospital Stay (HOSPITAL_COMMUNITY): Admission: RE | Admit: 2020-03-31 | Payer: Medicare Other | Source: Ambulatory Visit

## 2020-03-31 ENCOUNTER — Other Ambulatory Visit (INDEPENDENT_AMBULATORY_CARE_PROVIDER_SITE_OTHER): Payer: BC Managed Care – PPO

## 2020-03-31 ENCOUNTER — Other Ambulatory Visit: Payer: Self-pay

## 2020-03-31 DIAGNOSIS — Z794 Long term (current) use of insulin: Secondary | ICD-10-CM | POA: Diagnosis not present

## 2020-03-31 DIAGNOSIS — E291 Testicular hypofunction: Secondary | ICD-10-CM | POA: Diagnosis not present

## 2020-03-31 DIAGNOSIS — E1165 Type 2 diabetes mellitus with hyperglycemia: Secondary | ICD-10-CM | POA: Diagnosis not present

## 2020-03-31 LAB — TESTOSTERONE: Testosterone: 243.23 ng/dL — ABNORMAL LOW (ref 300.00–890.00)

## 2020-03-31 LAB — GLUCOSE, RANDOM: Glucose, Bld: 135 mg/dL — ABNORMAL HIGH (ref 70–99)

## 2020-04-01 LAB — FRUCTOSAMINE: Fructosamine: 375 umol/L — ABNORMAL HIGH (ref 0–285)

## 2020-04-07 ENCOUNTER — Encounter: Payer: Self-pay | Admitting: Endocrinology

## 2020-04-07 ENCOUNTER — Ambulatory Visit: Payer: BC Managed Care – PPO | Admitting: Endocrinology

## 2020-04-07 ENCOUNTER — Other Ambulatory Visit: Payer: Self-pay

## 2020-04-07 VITALS — BP 142/70 | HR 71 | Ht 71.0 in | Wt 293.8 lb

## 2020-04-07 DIAGNOSIS — E039 Hypothyroidism, unspecified: Secondary | ICD-10-CM

## 2020-04-07 DIAGNOSIS — E291 Testicular hypofunction: Secondary | ICD-10-CM | POA: Diagnosis not present

## 2020-04-07 DIAGNOSIS — E782 Mixed hyperlipidemia: Secondary | ICD-10-CM | POA: Diagnosis not present

## 2020-04-07 DIAGNOSIS — E1165 Type 2 diabetes mellitus with hyperglycemia: Secondary | ICD-10-CM

## 2020-04-07 DIAGNOSIS — Z794 Long term (current) use of insulin: Secondary | ICD-10-CM

## 2020-04-07 MED ORDER — TESTOSTERONE CYPIONATE 200 MG/ML IM SOLN
100.0000 mg | INTRAMUSCULAR | 1 refills | Status: DC
Start: 2020-04-07 — End: 2020-10-16

## 2020-04-07 NOTE — Patient Instructions (Addendum)
Novolog 1 unit per 1 gm Carb at mealtimes  Check blood sugars on waking up 7  days a week  Also check blood sugars about 2 hours after meals and do this after different meals by rotation  Recommended blood sugar levels on waking up are 90-130 and about 2 hours after meal is 130-160  Please bring your blood sugar monitor to each visit, thank you  No Short insulin at bedtime  May adjust Tresiba up if am sugar high

## 2020-04-07 NOTE — Progress Notes (Signed)
Patient ID: Robert [name], male   DOB: 1961-06-30, 59 y.o.   MRN: [id number]     Reason for Appointment: Endocrinology follow-up  History of Present Illness   DIABETES diagnosis date:  2012  Previous history: His blood sugar has been difficult to control since onset. Initially was taking oral hypoglycemic drugs like glyburide but could not take metformin because of renal dysfunction. Since Victoza did not help his control he was started on Lantus and then U 500 insulin also His A1c has previously ranged from 7.8-9.8, the lowest level in 03/2013 He was on Victoza but because of his markedly decreased appetite and weight loss this was stopped in 9/15  Recent history:   Insulin regimen: Tresiba 60 units daily in am., Humulin U-500 before meals 10-20 units units Previously following 1: 5 carbohydrate ratio  Non-hypoglycemic insulin regimen: Victoza 1.8 mg daily  His A1c is higher at 8.7 Fructosamine is still high at 375, was 366  Current blood sugar patterns, management and problems identified:    He has been asked to increase his Humulin R and not clear how often he is doing this  Also not consistently estimating how much he needs to take for different meals  Last evening he had relatively high fat food and mixed drinks at a restaurant and his blood sugar was 374 after eating  Also despite increasing Tresiba to 60 units his fasting readings are mostly high and he did not increase his dose further  However he took additional insulin last night with his U-500 insulin and this morning his blood sugar went down to 65  Blood sugar monitoring has also been sporadic and more in the morning, less after meals  Has had blood sugars below 200 only twice in the last month  He has gained a little weight although he thinks this is partly from fluid fluctuation  This is despite trying to be active with some yard work  He takes [name] and Victoza regularly in the  morning  Previously has been reluctant to use a CGM and insulin pump   Proper timing of medications in relation to meals: Yes.          Monitors blood glucose:  recently 1-2 times a day .    Glucometer: Accu-Chek         Blood Glucose readings by meter download   PRE-MEAL Fasting Lunch Dinner Bedtime Overall  Glucose range:  65-385  357  203    Mean/median:  223     256   POST-MEAL PC Breakfast PC Lunch PC Dinner  Glucose range:   221-446  178-374  Mean/median:      Previous readings:   PRE-MEAL Fasting Lunch Dinner Bedtime Overall  Glucose range:  199-330  117  112  217, 368   Mean/median:        POST-MEAL PC Breakfast PC Lunch PC Dinner  Glucose range:   370   Mean/median:        Meals: 2-3 meals per day. 7-8 am and Noon and Dinner 5-7 PM .        Physical activity: exercise: yard work       Weight control: No  Wt Readings from Last 3 Encounters:  04/07/20 293 lb 12.8 oz (133.3 kg)  03/11/20 287 lb (130.2 kg)  03/03/20 287 lb 4.8 oz (670.1 kg)        Complications:   nephropathy, neuropathy   Diabetes labs:   Lab Results  Component  Value Date   HGBA1C 8.7 (H) 02/12/2020   HGBA1C 7.2 (H) 08/03/2019   HGBA1C 6.4 07/07/2019   Lab Results  Component Value Date   MICROALBUR 146.3 (H) 08/03/2019   LDLCALC 92 07/07/2019   CREATININE 2.09 (H) 03/03/2020    Lab Results  Component Value Date   FRUCTOSAMINE 375 (H) 03/31/2020   FRUCTOSAMINE 367 (H) 02/12/2020   FRUCTOSAMINE 281 04/29/2019     No visits with results within 1 Week(s) from this visit.  Latest known visit with results is:  Lab on 03/31/2020  Component Date Value Ref Range Status  . Testosterone 03/31/2020 243.23* 300.00 - 890.00 ng/dL Final  . Fructosamine 03/31/2020 375* 0 - 285 umol/L Final   Comment: Published reference interval for apparently healthy subjects between age 72 and 82 is 65 - 285 umol/L and in a poorly controlled diabetic population is 228 - 563 umol/L with a mean of  396 umol/L.   Marland Kitchen Glucose, Bld 03/31/2020 135* 70 - 99 mg/dL Final    OTHER problems: See review of systems    Allergies as of 04/07/2020   No Known Allergies     Medication List       Accurate as of April 07, 2020  3:52 PM. If you have any questions, ask your nurse or doctor.        Accu-Chek [employer] Kit 1 each by Does not apply route daily.   Accu-Chek FastClix Lancets Misc Use lancets to check blood sugar 5 times daily.   amLODipine 10 MG tablet Commonly known as: NORVASC Take 1 tablet (10 mg total) by mouth daily.   aspirin EC 81 MG tablet Take 81 mg by mouth daily.   B-D UF III MINI PEN NEEDLES 31G X 5 MM Misc Generic drug: Insulin Pen Needle USE 4 PER DAY TO INJECT INSULIN.   DULoxetine 30 MG capsule Commonly known as: CYMBALTA Take 30 mg by mouth daily.   Fifty50 Glucose Meter 2.0 w/Device Kit Use as directed   Accu-Chek Guide w/Device Kit 1 each by Does not apply route daily. Use meter to check blood sugar 5 times daily.   fluocinonide cream 0.05 % Commonly known as: LIDEX Apply 1 application topically daily as needed (Skin inflammation (hand, thigh, back)).   furosemide 20 MG tablet Commonly known as: LASIX Take 40 mg by mouth daily as needed for edema.   gabapentin 100 MG capsule Commonly known as: NEURONTIN Take 200-300 mg by mouth See admin instructions. 200 mg in the morning and 300 mg at bedtime   glucose blood test strip Commonly known as: Accu-Chek Guide 1 each by Other route as needed for other. Use as instructed to check blood sugar 5 times daily.   HUMULIN R 500 UNIT/ML injection Generic drug: insulin regular human CONCENTRATED Using a U-100 1cc/31G/5/16" INSULIN SYRINGE, draw up and inject 5-18 units into the skin three times a day 30 minutes prior to each meal   Insulin Syringe-Needle U-100 31G X 5/16" 0.3 ML Misc Commonly known as: BD Insulin Syringe U/F Use to inject insulin 2-3 times per day   BD Insulin Syringe U/F 31G  X 5/16" 1 ML Misc Generic drug: Insulin Syringe-Needle U-100 USE 3 TIMES A DAY WITH HUMULIN R   lactulose 10 GM/15ML solution Commonly known as: CHRONULAC Take 10 g by mouth daily as needed.   levothyroxine 100 MCG tablet Commonly known as: SYNTHROID TAKE 1 TABLET BY MOUTH EVERY DAY   lisinopril 20 MG tablet Commonly known as:  ZESTRIL Take 20 mg by mouth 2 (two) times a day. Take 1 tablet (3m total) by mouth twice daily.   metoprolol tartrate 50 MG tablet Commonly known as: LOPRESSOR TAKE 1 TABLET BY MOUTH TWICE A DAY   mirabegron ER 50 MG Tb24 tablet Commonly known as: MYRBETRIQ Take by mouth.   mycophenolate 250 MG capsule Commonly known as: CELLCEPT Take 1,000 mg by mouth every 12 (twelve) hours.   Nulojix 250 MG Solr injection Generic drug: belatacept Inject 24.5 mLs into the vein every 28 (twenty-eight) days.   predniSONE 5 MG tablet Commonly known as: DELTASONE Take 5 mg by mouth daily with breakfast.   rosuvastatin 10 MG tablet Commonly known as: CRESTOR TAKE 1 TABLET (10 MG TOTAL) BY MOUTH DAILY. WILL ONLY PROVIDE 90 DAY SUPPLY. MUST CALL TO SCHEDULE APPT.   senna-docusate 8.6-50 MG tablet Commonly known as: Senokot-S Take 1 tablet by mouth as needed for mild constipation.   sulfamethoxazole-trimethoprim 800-160 MG tablet Commonly known as: BACTRIM DS Take 1 tablet by mouth 2 (two) times daily.   tadalafil 5 MG tablet Commonly known as: CIALIS Take by mouth.   tamsulosin 0.4 MG Caps capsule Commonly known as: FLOMAX Take 0.8 mg by mouth at bedtime.   Testosterone 20.25 MG/ACT (1.62%) Gel APPLY 3 PUMPS ON ONE ARM AND 2 ON THE OTHER EVERY MORNING ON SHOULDER AREA   traMADol 50 MG tablet Commonly known as: ULTRAM Take 1 tablet (50 mg total) by mouth every 6 (six) hours as needed. What changed:   how much to take  reasons to take this   traZODone 50 MG tablet Commonly known as: DESYREL Take 50-100 mg by mouth at bedtime as needed for  sleep.   TTyler AasFlexTouch 100 UNIT/ML FlexTouch Pen Generic drug: insulin degludec Inject 54 units under the skin once daily.   UNABLE TO FIND BiPAP: At bedtime   Victoza 18 MG/3ML Sopn Generic drug: liraglutide INJECT 1.8 MG UNDER THE SKIN ONCE DAILY   zolpidem 10 MG tablet Commonly known as: AMBIEN Take 10-20 mg by mouth at bedtime as needed for sleep.       Allergies: No Known Allergies  Past Medical History:  Diagnosis Date  . Anemia    low hgb at present  . Arthritis    FEET  . Blood transfusion without reported diagnosis 1974   kidney removed - L  . Cancer ([facility]    skin ca right shoulder, plastic dsyplasia, pre-Ca polpys removed on Colonoscopy- 07/2014  . Colon polyps 07/23/2014   Tubular adenomas x 5  . Constipation   . Diabetes mellitus without complication (HCC)    Type 2  . Difficult intubation    unsure of actual problem but it was during the January 28, 2015 procedure.  .Marland KitchenDysrhythmia   . Eczema   . ESRD (end stage renal disease) ([facility]    hEMO mwf  . Headache(784.0)    slight dull h/a due kidney failure  . Hyperlipidemia   . Hypertension    SVT h/o , followed by Dr. KClaiborne Billings . Hypothyroidism   . Macular degeneration   . Neuropathy    right hand and both feet  . PAT (paroxysmal atrial tachycardia) ([facility]   . PSVT (paroxysmal supraventricular tachycardia) ([facility]   . SBO (small bowel obstruction) ([facility]   . Shortness of breath    with exertion  . Sleep apnea 06/18/11   split-night sleep study- [facility]., BiPAP- 13-16  . Thyroid disease   .  Umbilical hernia    will repair with thi surgery    Past Surgical History:  Procedure Laterality Date  . AV FISTULA PLACEMENT Right 02/25/2015   Procedure: RIGHT ARTERIOVENOUS (AV) FISTULA CREATION;  Surgeon: Serafina Mitchell, MD;  Location: [place];  Service: Vascular;  Laterality: Right;  . AV FISTULA PLACEMENT Right 09/15/2015   Procedure: ARTERIOVENOUS (AV) FISTULA CREATION- RIGHT ARM;   Surgeon: Serafina Mitchell, MD;  Location: [place];  Service: Vascular;  Laterality: Right;  . CAPD INSERTION N/A 05/18/2014   Procedure: LAPAROSCOPIC INSERTION CONTINUOUS AMBULATORY PERITONEAL DIALYSIS  (CAPD) CATHETER;  Surgeon: Ralene Ok, MD;  Location: [place];  Service: General;  Laterality: N/A;  . COLONOSCOPY    . COLONOSCOPY WITH PROPOFOL N/A 07/12/2016   Procedure: COLONOSCOPY WITH PROPOFOL;  Surgeon: [name], MD;  [facility];  Service: Endoscopy;  Laterality: N/A;  . declotting of fistula  08/2015  . ESOPHAGOGASTRODUODENOSCOPY (EGD) WITH PROPOFOL N/A 07/12/2016   Procedure: ESOPHAGOGASTRODUODENOSCOPY (EGD) WITH PROPOFOL;  Surgeon: [name], MD;  [facility];  Service: Endoscopy;  Laterality: N/A;  . FISTULA SUPERFICIALIZATION Right 02/07/2016   Procedure: RIIGHT UPPER ARM FISTULA SUPERFICIALIZATION;  Surgeon: Elam Dutch, MD;  Location: [place];  Service: Vascular;  Laterality: Right;  . IJ catheter insertion    . INSERTION OF DIALYSIS CATHETER Right 02/25/2015   Procedure: INSERTION OF RIGHT INTERNAL JUGULAR DIALYSIS CATHETER;  Surgeon: Serafina Mitchell, MD;  Location: [place];  Service: Vascular;  Laterality: Right;  . KIDNEY TRANSPLANT    . LAPAROSCOPIC REPOSITIONING CAPD CATHETER N/A 06/16/2014   Procedure: LAPAROSCOPIC REPOSITIONING CAPD CATHETER;  Surgeon: Ralene Ok, MD;  Location: [place];  Service: General;  Laterality: N/A;  . LAPAROSCOPY N/A 05/04/2015   Procedure: LAPAROSCOPY DIAGNOSTIC LYSIS OF ADHESIONS;  Surgeon: Coralie Keens, MD;  Location: [place];  Service: General;  Laterality: N/A;  . MINOR REMOVAL OF PERITONEAL DIALYSIS CATHETER N/A 04/28/2015   Procedure:  REMOVAL OF PERITONEAL DIALYSIS CATHETER;  Surgeon: Coralie Keens, MD;  Location: [place];  Service: General;  Laterality: N/A;  . NEPHRECTOMY Left 1974  . RENAL BIOPSY Right 2012  . REVISON OF ARTERIOVENOUS FISTULA Right 05/26/2015   Procedure: SUPERFICIALIZATION OF  ARTERIOVENOUS FISTULA WITH SIDE BRANCH LIGATIONS;  Surgeon: Serafina Mitchell, MD;  Location: [place];  Service: Vascular;  Laterality: Right;  . SKIN CANCER EXCISION     right shoulder  . SVT ABLATION N/A 11/23/2016   Procedure: SVT Ablation;  Surgeon: Will Meredith Leeds, MD;  Location: [place] [facility];  Service: Cardiovascular;  Laterality: N/A;  . UMBILICAL HERNIA REPAIR N/A 05/18/2014   Procedure: HERNIA REPAIR UMBILICAL ADULT;  Surgeon: Ralene Ok, MD;  Location: [place];  Service: General;  Laterality: N/A;  . UMBILICAL HERNIA REPAIR N/A 04/28/2015   Procedure: UMBILICAL HERNIA REPAIR WITH MESH;  Surgeon: Coralie Keens, MD;  [facility];  Service: General;  Laterality: N/A;    Family History  Problem Relation Age of Onset  . Colon polyps Father   . Colon cancer Neg Hx   . Stomach cancer Neg Hx     Social History:  reports that he quit smoking about 7 years ago. His smoking use included cigars. He has never used smokeless tobacco. He reports current alcohol use. He reports that he does not use drugs.  Review of Systems:   Hypertension:    His blood pressure has been managed by his nephrologist and transplant team monitors BP at home  and it may be high at times also  BP Readings from Last 3 Encounters:  04/07/20 (!) 142/70  03/11/20 (!) 142/72  03/03/20 116/65    Lipids:  His LDL is below 100 with 10 mg Crestor Has no evidence of vascular disease [facility] 92    Lab Results  Component Value Date   CHOL 158 07/07/2019   CHOL 153 04/29/2019   CHOL 166 07/10/2018   Lab Results  Component Value Date   HDL 47 07/07/2019   HDL 54.30 04/29/2019   HDL 54.20 07/10/2018   Lab Results  Component Value Date   LDLCALC 92 07/07/2019   [place] 80 04/29/2019   LDLCALC 95 07/10/2018   Lab Results  Component Value Date   TRIG 93 07/07/2019   TRIG 92.0 04/29/2019   TRIG 86.0 07/10/2018   Lab Results  Component Value Date   CHOLHDL 3 04/29/2019    CHOLHDL 3 07/10/2018   CHOLHDL 3 05/28/2017   No results found for: LDLDIRECT   HYPOTHYROIDISM: He has had long-standing hypothyroidism  Has been taking his 100 g levothyroxine before breakfast daily   TSH normal  Lab Results  Component Value Date   TSH 1.33 02/12/2020       HYPOGONADISM: 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 metabolic syndrome and previously had subjectively improved with supplementation using Fortesta  He was having fatigue and decreased motivation and because of his free testosterone level being low at 3.7 he was prescribed AndroGel in February 2019 His testosterone level has been inconsistent  Testosterone level is recently persistently low  Still complains of tiredness most of the time, also has had some depression  He says he is trying to take his AndroGel on the days he is taking a shower and not understanding the need to take it every day.  He may take this 3 to 4 days a week  Lab Results  Component Value Date   TESTOSTERONE 243.23 (L) 03/31/2020   TESTOSTERONE 241.91 (L) 02/12/2020   TESTOSTERONE 243.59 (L) 08/03/2019    He has depression but only taking 30 mg of Cymbalta; followed by PCP  Diabetic neuropathy: Has mild sensory loss on his last exam    Examination:   BP (!) 142/70 (BP Location: Left Arm, Patient Position: Sitting, Cuff Size: Large)   Pulse 71   Ht _0  (1.803 m)   Wt 293 lb 12.8 oz (133.3 kg)   SpO2 95%   BMI 40.98 kg/m   Body mass index is 40.98 kg/m.    ASSESSMENT/ PLAN:    Diabetes type 2 with obesity, insulin-requiring:   See history of present illness for detailed discussion of his current management, problems identified and sugar patterns on his meter download  Currently on a regimen of Tresiba, Victoza and mealtime U-500 insulin with syringe  A1c is last 8.7  He continues to have high blood sugars because of being insulin resistant and also likely more insulin  deficient Continues to have high fasting readings most of the time and also some postprandial hyperglycemia Is not able to follow his diet well and because of his significant rise in blood sugar after meals he is likely needs a more faster acting insulin than the Humulin R U-500 Also with mixed alcoholic drinks his blood sugars tend to go up and he does not adjust his insulin Premeal as needed Blood sugar monitoring is also inadequate He only has a couple of readings below 200  on his monitor in the last month as discussed above  Not losing weight despite taking Victoza  Although he may benefit from using a CGM and OmniPod insulin pump he is more concerned about the cost and not wanting to go through more paperwork again   Recommendations:  Again discussed the need for better control and management  He needs to work on his diet with restricting high-fat, high-calorie meals and reduced alcohol intake  We will switch to NovoLog before meals  For now for simplicity can use 1: 1 carbohydrate ratio  He will avoid taking any postprandial insulin for correction especially later at night  If his morning sugars do not improve with better postprandial control at night he will increase his [name] further by 4 to 8 units  We will try to work with him to get his insulin pump approved and check on the cost of the [place]: He has consistently low levels of testosterone and will do better with injections rather than daily transdermal treatment which he cannot comply with This may also improve his motivation and energy  LIPIDS: Well-controlled, recently checked at [facility]   Hypothyroidism: Has been controlled with normal TSH, will continue same dose  There are no Patient Instructions on file for this visit.     Elayne Snare 04/07/2020, 3:52 PM

## 2020-04-08 ENCOUNTER — Telehealth: Payer: Self-pay

## 2020-04-08 ENCOUNTER — Other Ambulatory Visit: Payer: Self-pay

## 2020-04-08 MED ORDER — TRESIBA FLEXTOUCH 100 UNIT/ML ~~LOC~~ SOPN: PEN_INJECTOR | SUBCUTANEOUS | 1 refills | Status: DC

## 2020-04-08 MED ORDER — NOVOLOG FLEXPEN 100 UNIT/ML ~~LOC~~ SOPN: 50.0000 [IU] | PEN_INJECTOR | Freq: Three times a day (TID) | SUBCUTANEOUS | 1 refills | Status: DC

## 2020-04-08 MED ORDER — SYRINGE LUER LOCK 3 ML MISC: 1 refills | Status: DC

## 2020-04-08 MED ORDER — "BD HYPODERMIC NEEDLE 18G X 1"" MISC": 1 refills | Status: DC

## 2020-04-08 NOTE — Telephone Encounter (Signed)
PA initiated via [email or web address] for Testosterone injections.Robert [name]  Key: [id number]  PA Case ID: [id number]  Rx #: [id number] Outcome: Approved today  Your PA request has been approved. Additional information will be provided in the approval communication. (Message 1145) Drug: Testosterone Cypionate 200MG /ML intramuscular solution Form: Charity fundraiser PA Form [phone] NCPDP) Original Claim Info [place] REQ-MD CALL [phone].DRUG REQUIRES PRIOR AUTHORIZATION(PHARMACY HELP DESK [phone])

## 2020-04-12 ENCOUNTER — Telehealth: Payer: Self-pay

## 2020-04-12 NOTE — Telephone Encounter (Signed)
FAXED [place]: [place] (Faxed on 0/07/21) Document: initial omnipod prescription Other records requested: none at this time.  All above requested information has been faxed successfully to [employer] listed above. Documents and fax confirmation have been placed in the faxed file for future reference.

## 2020-04-19 DIAGNOSIS — R1031 Right lower quadrant pain: Secondary | ICD-10-CM

## 2020-04-19 HISTORY — DX: Right lower quadrant pain: R10.31

## 2020-04-20 ENCOUNTER — Other Ambulatory Visit: Payer: Self-pay | Admitting: Cardiovascular Disease

## 2020-05-12 ENCOUNTER — Telehealth: Payer: Self-pay

## 2020-05-12 NOTE — Telephone Encounter (Signed)
FAXED [place]: [place]: LMN for insulin pump and pump supplies Other records requested: None requested  All above requested information has been faxed successfully to [employer] listed above. Documents and fax confirmation have been placed in the faxed file for future reference.

## 2020-05-18 ENCOUNTER — Ambulatory Visit: Payer: BC Managed Care – PPO | Admitting: Podiatry

## 2020-05-18 ENCOUNTER — Encounter: Payer: Self-pay | Admitting: Podiatry

## 2020-05-18 ENCOUNTER — Other Ambulatory Visit: Payer: Self-pay

## 2020-05-18 DIAGNOSIS — M79675 Pain in left toe(s): Secondary | ICD-10-CM | POA: Diagnosis not present

## 2020-05-18 DIAGNOSIS — B351 Tinea unguium: Secondary | ICD-10-CM | POA: Diagnosis not present

## 2020-05-18 DIAGNOSIS — N189 Chronic kidney disease, unspecified: Secondary | ICD-10-CM | POA: Diagnosis not present

## 2020-05-18 DIAGNOSIS — M79674 Pain in right toe(s): Secondary | ICD-10-CM

## 2020-05-18 DIAGNOSIS — E1149 Type 2 diabetes mellitus with other diabetic neurological complication: Secondary | ICD-10-CM

## 2020-05-18 NOTE — Progress Notes (Signed)
This patient returns to my office for at risk foot care.  This patient requires this care by a professional since this patient will be at risk due to having uncontrolled diabetes and kidney transplant.  This patient is unable to cut nails himself since the patient cannot reach his nails.These nails are painful walking and wearing shoes.  Resolved heel fissure. This patient presents for at risk foot care today.  General Appearance  Alert, conversant and in no acute stress.  Vascular  Dorsalis pedis and posterior tibial  pulses are palpable  bilaterally.  Capillary return is within normal limits  bilaterally. Temperature is within normal limits  bilaterally.  Neurologic  Senn-Weinstein monofilament wire test diminished   bilaterally. Muscle power within normal limits bilaterally.  Nails Thick disfigured discolored nails with subungual debris  from hallux to fifth toes bilaterally. No evidence of bacterial infection or drainage bilaterally.  Orthopedic  No limitations of motion  feet .  No crepitus or effusions noted.  No bony pathology or digital deformities noted. HAV  B/L.  Skin  normotropic skin with no porokeratosis noted bilaterally.  No signs of infections or ulcers noted.   Fissures left heel.  Onychomycosis  Pain in right toes  Pain in left toes  Fissures left heel.  Consent was obtained for treatment procedures.   Mechanical debridement of nails 1-5  bilaterally performed with a nail nipper.  Filed with dremel without incident.     Return office visit  10 weeks                   Told patient to return for periodic foot care and evaluation due to potential at risk complications.   Gardiner Barefoot DPM

## 2020-05-20 ENCOUNTER — Telehealth: Payer: Self-pay | Admitting: *Deleted

## 2020-05-20 NOTE — Telephone Encounter (Signed)
Faxed LOV notes to [phone]

## 2020-05-31 ENCOUNTER — Other Ambulatory Visit: Payer: Self-pay

## 2020-05-31 ENCOUNTER — Other Ambulatory Visit (INDEPENDENT_AMBULATORY_CARE_PROVIDER_SITE_OTHER): Payer: BC Managed Care – PPO

## 2020-05-31 DIAGNOSIS — E1165 Type 2 diabetes mellitus with hyperglycemia: Secondary | ICD-10-CM | POA: Diagnosis not present

## 2020-05-31 DIAGNOSIS — Z794 Long term (current) use of insulin: Secondary | ICD-10-CM | POA: Diagnosis not present

## 2020-05-31 DIAGNOSIS — E291 Testicular hypofunction: Secondary | ICD-10-CM | POA: Diagnosis not present

## 2020-05-31 LAB — CBC
HCT: 41.6 % (ref 39.0–52.0)
Hemoglobin: 13.8 g/dL (ref 13.0–17.0)
MCHC: 33.1 g/dL (ref 30.0–36.0)
MCV: 85.1 fl (ref 78.0–100.0)
Platelets: 267 10*3/uL (ref 150.0–400.0)
RBC: 4.89 Mil/uL (ref 4.22–5.81)
RDW: 15.4 % (ref 11.5–15.5)
WBC: 8.8 10*3/uL (ref 4.0–10.5)

## 2020-05-31 LAB — COMPREHENSIVE METABOLIC PANEL
ALT: 22 U/L (ref 0–53)
AST: 14 U/L (ref 0–37)
Albumin: 4.1 g/dL (ref 3.5–5.2)
Alkaline Phosphatase: 67 U/L (ref 39–117)
BUN: 24 mg/dL — ABNORMAL HIGH (ref 6–23)
CO2: 27 mEq/L (ref 19–32)
Calcium: 10 mg/dL (ref 8.4–10.5)
Chloride: 102 mEq/L (ref 96–112)
Creatinine, Ser: 1.76 mg/dL — ABNORMAL HIGH (ref 0.40–1.50)
GFR: 39.56 mL/min — ABNORMAL LOW (ref >60.00)
Glucose, Bld: 283 mg/dL — ABNORMAL HIGH (ref 70–99)
Potassium: 4.8 mEq/L (ref 3.5–5.1)
Sodium: 136 mEq/L (ref 135–145)
Total Bilirubin: 1 mg/dL (ref 0.2–1.2)
Total Protein: 6.3 g/dL (ref 6.0–8.3)

## 2020-05-31 LAB — HEMOGLOBIN A1C: Hgb A1c MFr Bld: 8.6 % — ABNORMAL HIGH (ref 4.6–6.5)

## 2020-05-31 LAB — TESTOSTERONE: Testosterone: 181.28 ng/dL — ABNORMAL LOW (ref 300.00–890.00)

## 2020-06-02 ENCOUNTER — Ambulatory Visit: Payer: BC Managed Care – PPO | Admitting: Endocrinology

## 2020-06-02 ENCOUNTER — Other Ambulatory Visit: Payer: Self-pay

## 2020-06-02 ENCOUNTER — Encounter: Payer: Self-pay | Admitting: Endocrinology

## 2020-06-02 VITALS — BP 150/78 | HR 74 | Ht 71.0 in | Wt 286.2 lb

## 2020-06-02 DIAGNOSIS — E1165 Type 2 diabetes mellitus with hyperglycemia: Secondary | ICD-10-CM

## 2020-06-02 DIAGNOSIS — E039 Hypothyroidism, unspecified: Secondary | ICD-10-CM

## 2020-06-02 DIAGNOSIS — E782 Mixed hyperlipidemia: Secondary | ICD-10-CM | POA: Diagnosis not present

## 2020-06-02 DIAGNOSIS — E291 Testicular hypofunction: Secondary | ICD-10-CM | POA: Diagnosis not present

## 2020-06-02 DIAGNOSIS — Z794 Long term (current) use of insulin: Secondary | ICD-10-CM

## 2020-06-02 NOTE — Progress Notes (Signed)
Patient ID: Robert [name], male   DOB: July 22, 1961, 59 y.o.   MRN: [id number]     Reason for Appointment: Endocrinology follow-up  History of Present Illness   DIABETES diagnosis date:  2012  Previous history: His blood sugar has been difficult to control since onset. Initially was taking oral hypoglycemic drugs like glyburide but could not take metformin because of renal dysfunction. Since Victoza did not help his control he was started on Lantus and then U 500 insulin also His A1c has previously ranged from 7.8-9.8, the lowest level in 03/2013 He was on Victoza but because of his markedly decreased appetite and weight loss this was stopped in 9/15  Recent history:   Insulin regimen: Tresiba 80 units daily in am.,  Novolog or Humulin R U-500, 5-10 units Previously following 1: 5 carbohydrate ratio  Non-hypoglycemic insulin regimen: Victoza 1.8 mg daily  His A1c is still high at 8.6, previously at 8.7 Fructosamine is more recently high at 375, was 366  Current blood sugar patterns, management and problems identified:    He has been asked to switch to NovoLog for mealtime insulin to allow more rapid onset of action and cover mealtime spikes as well as allow timing right before eating  However he is only using NovoLog sometimes and Humulin R at other times  Also he thinks he is forgetting to take the NovoLog before eating about half the time even when he is working from home  He also does not take more than 5 units of NovoLog because he thinks his blood sugars will get low  However he has increase his TRESIBA by 20 units since his last visit  This despite this his FASTING blood sugars are recently much higher and close to or over 200  As before blood sugar monitoring is inadequate  Has only minimal monitoring after dinner and about 3 weeks ago all his blood sugars after dinner were over 270  His weight fluctuates and is slightly lower  He does now have the  OmniPod pump available but has not scheduled training  Has been reluctant to use a CGM  On an average blood sugars are somewhat better than the last visit but still averaging over 200   Proper timing of medications in relation to meals: Yes.          Monitors blood glucose:  recently 1-2 times a day .    Glucometer: Accu-Chek         Blood Glucose readings by meter download   PRE-MEAL Fasting Lunch Dinner Bedtime Overall  Glucose range:  95-353  148, 115    95-396  Mean/median:  205     228   POST-MEAL PC Breakfast PC Lunch PC Dinner  Glucose range:   223-396  271-278  Mean/median:   273    PREVIOUSLY:  PRE-MEAL Fasting Lunch Dinner Bedtime Overall  Glucose range:  65-385  357  203    Mean/median:  223     256   POST-MEAL PC Breakfast PC Lunch PC Dinner  Glucose range:   221-446  178-374  Mean/median:        Meals: 2-3 meals per day. 7-8 am and Noon and Dinner 5-7 PM .        Physical activity: exercise: Mostly yard work       Weight control: No  Wt Readings from Last 3 Encounters:  06/02/20 286 lb 3.2 oz (129.8 kg)  04/07/20 293 lb 12.8 oz (133.3 kg)  03/11/20 287 lb (623.7 kg)        Complications:   nephropathy, neuropathy   Diabetes labs:   Lab Results  Component Value Date   HGBA1C 8.6 (H) 05/31/2020   HGBA1C 8.7 (H) 02/12/2020   HGBA1C 7.2 (H) 08/03/2019   Lab Results  Component Value Date   MICROALBUR 146.3 (H) 08/03/2019   LDLCALC 92 07/07/2019   CREATININE 1.76 (H) 05/31/2020    Lab Results  Component Value Date   FRUCTOSAMINE 375 (H) 03/31/2020   FRUCTOSAMINE 367 (H) 02/12/2020   FRUCTOSAMINE 281 04/29/2019     Lab on 05/31/2020  Component Date Value Ref Range Status   WBC 05/31/2020 8.8  4.0 - 10.5 K/uL Final   RBC 05/31/2020 4.89  4.22 - 5.81 Mil/uL Final   Platelets 05/31/2020 267.0  150 - 400 K/uL Final   Hemoglobin 05/31/2020 13.8  13.0 - 17.0 g/dL Final   HCT 05/31/2020 41.6  39 - 52 % Final   MCV 05/31/2020 85.1  78.0  - 100.0 fl Final   MCHC 05/31/2020 33.1  30.0 - 36.0 g/dL Final   RDW 05/31/2020 15.4  11.5 - 15.5 % Final   Testosterone 05/31/2020 181.28* 300.00 - 890.00 ng/dL Final   Sodium 05/31/2020 136  135 - 145 mEq/L Final   Potassium 05/31/2020 4.8  3.5 - 5.1 mEq/L Final   Chloride 05/31/2020 102  96 - 112 mEq/L Final   CO2 05/31/2020 27  19 - 32 mEq/L Final   Glucose, Bld 05/31/2020 283* 70 - 99 mg/dL Final   BUN 05/31/2020 24* 6 - 23 mg/dL Final   Creatinine, Ser 05/31/2020 1.76* 0.40 - 1.50 mg/dL Final   Total Bilirubin 05/31/2020 1.0  0.2 - 1.2 mg/dL Final   Alkaline Phosphatase 05/31/2020 67  39 - 117 U/L Final   AST 05/31/2020 14  0 - 37 U/L Final   ALT 05/31/2020 22  0 - 53 U/L Final   Total Protein 05/31/2020 6.3  6.0 - 8.3 g/dL Final   Albumin 05/31/2020 4.1  3.5 - 5.2 g/dL Final   GFR 05/31/2020 39.56* >60.00 mL/min Final   Calcium 05/31/2020 10.0  8.4 - 10.5 mg/dL Final   Hgb A1c MFr Bld 05/31/2020 8.6* 4.6 - 6.5 % Final   Glycemic Control Guidelines for People with Diabetes:Non Diabetic:  <6%Goal of Therapy: <7%Additional Action Suggested:  >8%     OTHER problems: See review of systems    Allergies as of 06/02/2020   No Known Allergies     Medication List       Accurate as of June 02, 2020  9:20 PM. If you have any questions, ask your nurse or doctor.        Accu-Chek [employer] Kit 1 each by Does not apply route daily.   Accu-Chek [employer]ix Lancets Misc Use lancets to check blood sugar 5 times daily.   amLODipine 10 MG tablet Commonly known as: NORVASC Take 1 tablet (10 mg total) by mouth daily.   aspirin EC 81 MG tablet Take 81 mg by mouth daily.   B-D UF III MINI PEN NEEDLES 31G X 5 MM Misc Generic drug: Insulin Pen Needle USE 4 PER DAY TO INJECT INSULIN.   BD Hypodermic Needle 18G X 1" Misc Generic drug: NEEDLE (DISP) 18 G Use 2 needles once weekly for Testosterone medication. Use 1 needle to draw up medication and 2'nd needle  to inject into muscle.   DULoxetine 30 MG capsule Commonly known as: CYMBALTA Take  30 mg by mouth daily.   Fifty50 Glucose Meter 2.0 w/Device Kit Use as directed   Accu-Chek Guide w/Device Kit 1 each by Does not apply route daily. Use meter to check blood sugar 5 times daily.   fluocinonide cream 0.05 % Commonly known as: LIDEX Apply 1 application topically daily as needed (Skin inflammation (hand, thigh, back)).   furosemide 20 MG tablet Commonly known as: LASIX Take 40 mg by mouth daily as needed for edema.   gabapentin 100 MG capsule Commonly known as: NEURONTIN Take 200-300 mg by mouth See admin instructions. 200 mg in the morning and 300 mg at bedtime   glucose blood test strip Commonly known as: Accu-Chek Guide 1 each by Other route as needed for other. Use as instructed to check blood sugar 5 times daily.   HUMULIN R 500 UNIT/ML injection Generic drug: insulin regular human CONCENTRATED Using a U-100 1cc/31G/5/16" INSULIN SYRINGE, draw up and inject 5-18 units into the skin three times a day 30 minutes prior to each meal   Insulin Syringe-Needle U-100 31G X 5/16" 0.3 ML Misc Commonly known as: BD Insulin Syringe U/F Use to inject insulin 2-3 times per day   BD Insulin Syringe U/F 31G X 5/16" 1 ML Misc Generic drug: Insulin Syringe-Needle U-100 USE 3 TIMES A DAY WITH HUMULIN R   lactulose 10 GM/15ML solution Commonly known as: CHRONULAC Take 10 g by mouth daily as needed.   levothyroxine 100 MCG tablet Commonly known as: SYNTHROID TAKE 1 TABLET BY MOUTH EVERY DAY   lisinopril 20 MG tablet Commonly known as: ZESTRIL Take 20 mg by mouth 2 (two) times a day. Take 1 tablet (92m total) by mouth twice daily.   metoprolol tartrate 50 MG tablet Commonly known as: LOPRESSOR TAKE 1 TABLET BY MOUTH TWICE A DAY   mirabegron ER 50 MG Tb24 tablet Commonly known as: MYRBETRIQ Take by mouth.   mycophenolate 250 MG capsule Commonly known as: CELLCEPT Take 1,000 mg  by mouth every 12 (twelve) hours.   NovoLOG FlexPen 100 UNIT/ML FlexPen Generic drug: insulin aspart Inject 50 Units into the skin 3 (three) times daily with meals.   Nulojix 250 MG Solr injection Generic drug: belatacept Inject 24.5 mLs into the vein every 28 (twenty-eight) days.   predniSONE 5 MG tablet Commonly known as: DELTASONE Take 5 mg by mouth daily with breakfast.   rosuvastatin 10 MG tablet Commonly known as: CRESTOR TAKE 1 TABLET (10 MG TOTAL) BY MOUTH DAILY. WILL ONLY PROVIDE 90 DAY SUPPLY. MUST CALL TO SCHEDULE APPT.   senna-docusate 8.6-50 MG tablet Commonly known as: Senokot-S Take 1 tablet by mouth as needed for mild constipation.   sulfamethoxazole-trimethoprim 800-160 MG tablet Commonly known as: BACTRIM DS Take 1 tablet by mouth 2 (two) times daily.   Syringe Luer Lock 3 ML Misc Use 1 syringe once weekly to inject Testosterone medication.   tadalafil 5 MG tablet Commonly known as: CIALIS Take by mouth.   tamsulosin 0.4 MG Caps capsule Commonly known as: FLOMAX Take 0.8 mg by mouth at bedtime.   testosterone cypionate 200 MG/ML injection Commonly known as: Depo-Testosterone Inject 0.5 mLs (100 mg total) into the muscle every 7 (seven) days.   traMADol 50 MG tablet Commonly known as: ULTRAM Take 1 tablet (50 mg total) by mouth every 6 (six) hours as needed. What changed:   how much to take  reasons to take this   traZODone 50 MG tablet Commonly known as: DESYREL Take 50-100 mg by  mouth at bedtime as needed for sleep.   [name] FlexTouch 100 UNIT/ML FlexTouch Pen Generic drug: insulin degludec Inject 80 units under the skin once daily.   UNABLE TO FIND BiPAP: At bedtime   Victoza 18 MG/3ML Sopn Generic drug: liraglutide INJECT 1.8 MG UNDER THE SKIN ONCE DAILY   zolpidem 10 MG tablet Commonly known as: AMBIEN Take 10-20 mg by mouth at bedtime as needed for sleep.       Allergies: No Known Allergies  Past Medical History:   Diagnosis Date   Anemia    low hgb at present   Arthritis    FEET   Blood transfusion without reported diagnosis 1974   kidney removed - L   Cancer ([place])    skin ca right shoulder, plastic dsyplasia, pre-Ca polpys removed on Colonoscopy- 07/2014   Colon polyps 07/23/2014   Tubular adenomas x 5   Constipation    Diabetes mellitus without complication ([address])    Type 2   Difficult intubation    unsure of actual problem but it was during the January 28, 2015 procedure.   Dysrhythmia    Eczema    ESRD (end stage renal disease) (HCC)    hEMO mwf   Headache(784.0)    slight dull h/a due kidney failure   Hyperlipidemia    Hypertension    SVT h/o , followed by Dr. Claiborne Billings   Hypothyroidism    Macular degeneration    Neuropathy    right hand and both feet   PAT (paroxysmal atrial tachycardia) (HCC)    PSVT (paroxysmal supraventricular tachycardia) (HCC)    SBO (small bowel obstruction) (HCC)    Shortness of breath    with exertion   Sleep apnea 06/18/11   split-night sleep study- [facility]., BiPAP- 13-16   Thyroid disease    Umbilical hernia    will repair with thi surgery    Past Surgical History:  Procedure Laterality Date   AV FISTULA PLACEMENT Right 02/25/2015   Procedure: RIGHT ARTERIOVENOUS (AV) FISTULA CREATION;  Surgeon: Serafina Mitchell, MD;  Location: [place];  Service: Vascular;  Laterality: Right;   AV FISTULA PLACEMENT Right 09/15/2015   Procedure: ARTERIOVENOUS (AV) FISTULA CREATION- RIGHT ARM;  Surgeon: Serafina Mitchell, MD;  Location: [place];  Service: Vascular;  Laterality: Right;   CAPD INSERTION N/A 05/18/2014   Procedure: LAPAROSCOPIC INSERTION CONTINUOUS AMBULATORY PERITONEAL DIALYSIS  (CAPD) CATHETER;  Surgeon: Ralene Ok, MD;  Location: [place];  Service: General;  Laterality: N/A;   COLONOSCOPY     COLONOSCOPY WITH PROPOFOL N/A 07/12/2016   Procedure: COLONOSCOPY WITH PROPOFOL;  Surgeon: [name], MD;   [facility];  Service: Endoscopy;  Laterality: N/A;   declotting of fistula  08/2015   ESOPHAGOGASTRODUODENOSCOPY (EGD) WITH PROPOFOL N/A 07/12/2016   Procedure: ESOPHAGOGASTRODUODENOSCOPY (EGD) WITH PROPOFOL;  Surgeon: [name], MD;  [facility];  Service: Endoscopy;  Laterality: N/A;   FISTULA SUPERFICIALIZATION Right 02/07/2016   Procedure: RIIGHT UPPER ARM FISTULA SUPERFICIALIZATION;  Surgeon: Elam Dutch, MD;  Location: [place];  Service: Vascular;  Laterality: Right;   IJ catheter insertion     INSERTION OF DIALYSIS CATHETER Right 02/25/2015   Procedure: INSERTION OF RIGHT INTERNAL JUGULAR DIALYSIS CATHETER;  Surgeon: Serafina Mitchell, MD;  Location: [place];  Service: Vascular;  Laterality: Right;   KIDNEY TRANSPLANT     LAPAROSCOPIC REPOSITIONING CAPD CATHETER N/A 06/16/2014   Procedure: LAPAROSCOPIC REPOSITIONING CAPD CATHETER;  Surgeon: Anne Hahn  Rosendo Gros, MD;  Location: [place];  Service: General;  Laterality: N/A;   LAPAROSCOPY N/A 05/04/2015   Procedure: LAPAROSCOPY DIAGNOSTIC LYSIS OF ADHESIONS;  Surgeon: Coralie Keens, MD;  Location: [place];  Service: General;  Laterality: N/A;   MINOR REMOVAL OF PERITONEAL DIALYSIS CATHETER N/A 04/28/2015   Procedure:  REMOVAL OF PERITONEAL DIALYSIS CATHETER;  Surgeon: Coralie Keens, MD;  Location: [place];  Service: General;  Laterality: N/A;   NEPHRECTOMY Left 1974   RENAL BIOPSY Right 2012   REVISON OF ARTERIOVENOUS FISTULA Right 05/26/2015   Procedure: SUPERFICIALIZATION OF ARTERIOVENOUS FISTULA WITH SIDE BRANCH LIGATIONS;  Surgeon: Serafina Mitchell, MD;  Location: [place];  Service: Vascular;  Laterality: Right;   SKIN CANCER EXCISION     right shoulder   SVT ABLATION N/A 11/23/2016   Procedure: SVT Ablation;  Surgeon: Will Meredith Leeds, MD;  Location: [place] [facility];  Service: Cardiovascular;  Laterality: N/A;   UMBILICAL HERNIA REPAIR N/A 05/18/2014   Procedure: HERNIA REPAIR UMBILICAL ADULT;  Surgeon:  Ralene Ok, MD;  Location: [place];  Service: General;  Laterality: N/A;   UMBILICAL HERNIA REPAIR N/A 04/28/2015   Procedure: UMBILICAL HERNIA REPAIR WITH MESH;  Surgeon: Coralie Keens, MD;  Location: [place];  Service: General;  Laterality: N/A;    Family History  Problem Relation Age of Onset   Colon polyps Father    Colon cancer Neg Hx    Stomach cancer Neg Hx     Social History:  reports that he quit smoking about 7 years ago. His smoking use included cigars. He has never used smokeless tobacco. He reports current alcohol use. He reports that he does not use drugs.  Review of Systems:   Hypertension:    His blood pressure has been managed by his nephrologist and transplant team monitors BP at home also  BP Readings from Last 3 Encounters:  06/02/20 (!) 150/78  04/07/20 (!) 142/70  03/11/20 (!) 142/72    Lipids:  His LDL is below 100 with 10 mg Crestor Has no evidence of vascular disease Last LDL at [facility] was 92    Lab Results  Component Value Date   CHOL 158 07/07/2019   CHOL 153 04/29/2019   CHOL 166 07/10/2018   Lab Results  Component Value Date   HDL 47 07/07/2019   HDL 54.30 04/29/2019   HDL 54.20 07/10/2018   Lab Results  Component Value Date   LDLCALC 92 07/07/2019   [place] 80 04/29/2019   LDLCALC 95 07/10/2018   Lab Results  Component Value Date   TRIG 93 07/07/2019   TRIG 92.0 04/29/2019   TRIG 86.0 07/10/2018   Lab Results  Component Value Date   CHOLHDL 3 04/29/2019   CHOLHDL 3 07/10/2018   CHOLHDL 3 05/28/2017   No results found for: LDLDIRECT   HYPOTHYROIDISM: He has had long-standing hypothyroidism  Has been taking his 100 g levothyroxine before breakfast daily  TSH normal  Lab Results  Component Value Date   TSH 1.33 02/12/2020       HYPOGONADISM: 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 metabolic syndrome and previously had subjectively improved with supplementation using  Fortesta  He was having fatigue and decreased motivation and because of his free testosterone level being low at 3.7 he was prescribed AndroGel in February 2019 His testosterone level has been inconsistent because of difficulty with remembering to apply his testosterone gel  Testosterone level is recently persistently low  Still complains  of tiredness most of the time, also has had some depression    Lab Results  Component Value Date   TESTOSTERONE 181.28 (L) 05/31/2020   TESTOSTERONE 243.23 (L) 03/31/2020   TESTOSTERONE 241.91 (L) 02/12/2020    He has depression but only taking 30 mg of Cymbalta; followed by PCP  Diabetic neuropathy: Has mild sensory loss on his last exam    Examination:   BP (!) 150/78 (BP Location: Left Arm, Patient Position: Sitting, Cuff Size: Large)    Pulse 74    Ht _0  (1.803 m)    Wt 286 lb 3.2 oz (129.8 kg)    SpO2 96%    BMI 39.92 kg/m   Body mass index is 39.92 kg/m.    ASSESSMENT/ PLAN:    Diabetes type 2 with obesity, insulin-requiring:   See history of present illness for detailed discussion of his current management, problems identified and sugar patterns on his meter download  Currently on a regimen of Tresiba, Victoza and mealtime U-500 insulin with syringe  A1c is about the same at 8.6  He continues to be insulin resistant and insulin deficient with average blood sugar over 200 Compliance with mealtime insulin is inadequate   Recommendations:  He will increase his [name] by at least 10 units to get morning sugars below 130  He will need to put a reminder on his refrigerator to take NovoLog right before eating at least  For simplicity he will use only NovoLog and not U-500  Discussed that since he has a persistently high readings he will need to use 1: 5 factor for his carbohydrate coverage and this will not be too much especially considering his needing large doses of basal insulin  Need to check readings after dinner  consistently  Avoid high-fat meals,  Regular exercise   HYPOGONADISM: He has consistently low levels of testosterone Still has not started injections as prescribed  He says he has difficulty injecting it but he needs to use a 22-gauge needle for injection as well as drying up the testosterone instead of trying to inject with 25-gauge  Renal dysfunction: Followed by nephrologist   Hypothyroidism: Needs follow-up periodically  Patient Instructions  Novolog 10-15 at dinner and lunch, just before the meal  Tresiba 90 units   Sticky note on Fridge  Try 1:5 carb  For meals  Bedtime sugars dailt      Elayne Snare 06/02/2020, 9:20 PM

## 2020-06-02 NOTE — Patient Instructions (Addendum)
Novolog 10-15 at dinner and lunch, just before the meal  Tresiba 90 units   Sticky note on Fridge  Try 1:5 carb  For meals  Bedtime sugars dailt

## 2020-06-08 ENCOUNTER — Telehealth: Payer: Self-pay | Admitting: Nutrition

## 2020-06-08 NOTE — Telephone Encounter (Signed)
Tried calling to schedule pump start.  No answer, and not able to leave a voice mail.

## 2020-06-29 ENCOUNTER — Other Ambulatory Visit: Payer: Self-pay | Admitting: Endocrinology

## 2020-07-04 ENCOUNTER — Telehealth: Payer: Self-pay | Admitting: Endocrinology

## 2020-07-04 NOTE — Telephone Encounter (Signed)
Called pt. Unable to leave message due to mailbox is full.

## 2020-07-04 NOTE — Telephone Encounter (Signed)
Thanks

## 2020-07-04 NOTE — Telephone Encounter (Signed)
I was not able to connect via phone with this patient, to make an appointment with him for his pump start.  The [employer] pump rep. Irven Baltimore was notified that she will have to do this while I am out.  She agreed to call the patient to schedule this She was reminded that Dr. Dwyane Dee needs to see this patient the week his started on his pump.

## 2020-07-04 NOTE — Telephone Encounter (Signed)
Robert [name] is out through the end of October 2021 and [name] is supposed to train [name] in [place] [facility].  Gave him the advise from [facility] that he should contact [employer] and be transferred to tech support for [employer] start up training

## 2020-07-07 DIAGNOSIS — E7800 Pure hypercholesterolemia, unspecified: Secondary | ICD-10-CM | POA: Diagnosis not present

## 2020-07-07 DIAGNOSIS — I10 Essential (primary) hypertension: Secondary | ICD-10-CM | POA: Diagnosis not present

## 2020-07-07 DIAGNOSIS — Z125 Encounter for screening for malignant neoplasm of prostate: Secondary | ICD-10-CM | POA: Diagnosis not present

## 2020-07-07 DIAGNOSIS — E1129 Type 2 diabetes mellitus with other diabetic kidney complication: Secondary | ICD-10-CM | POA: Diagnosis not present

## 2020-07-14 DIAGNOSIS — Z23 Encounter for immunization: Secondary | ICD-10-CM | POA: Diagnosis not present

## 2020-07-14 DIAGNOSIS — N401 Enlarged prostate with lower urinary tract symptoms: Secondary | ICD-10-CM | POA: Diagnosis not present

## 2020-07-14 DIAGNOSIS — I120 Hypertensive chronic kidney disease with stage 5 chronic kidney disease or end stage renal disease: Secondary | ICD-10-CM | POA: Diagnosis not present

## 2020-07-14 DIAGNOSIS — E7800 Pure hypercholesterolemia, unspecified: Secondary | ICD-10-CM | POA: Diagnosis not present

## 2020-07-14 DIAGNOSIS — D631 Anemia in chronic kidney disease: Secondary | ICD-10-CM | POA: Diagnosis not present

## 2020-07-14 DIAGNOSIS — E1129 Type 2 diabetes mellitus with other diabetic kidney complication: Secondary | ICD-10-CM | POA: Diagnosis not present

## 2020-07-14 DIAGNOSIS — Z794 Long term (current) use of insulin: Secondary | ICD-10-CM | POA: Diagnosis not present

## 2020-07-14 DIAGNOSIS — N2581 Secondary hyperparathyroidism of renal origin: Secondary | ICD-10-CM | POA: Diagnosis not present

## 2020-07-14 DIAGNOSIS — Z940 Kidney transplant status: Secondary | ICD-10-CM | POA: Diagnosis not present

## 2020-07-14 DIAGNOSIS — Z0000 Encounter for general adult medical examination without abnormal findings: Secondary | ICD-10-CM | POA: Diagnosis not present

## 2020-07-14 DIAGNOSIS — R82998 Other abnormal findings in urine: Secondary | ICD-10-CM | POA: Diagnosis not present

## 2020-07-14 DIAGNOSIS — N1832 Chronic kidney disease, stage 3b: Secondary | ICD-10-CM | POA: Diagnosis not present

## 2020-07-14 DIAGNOSIS — I10 Essential (primary) hypertension: Secondary | ICD-10-CM | POA: Diagnosis not present

## 2020-07-14 DIAGNOSIS — D8989 Other specified disorders involving the immune mechanism, not elsewhere classified: Secondary | ICD-10-CM | POA: Diagnosis not present

## 2020-07-15 ENCOUNTER — Telehealth: Payer: Self-pay | Admitting: Endocrinology

## 2020-07-16 MED ORDER — TRESIBA FLEXTOUCH 100 UNIT/ML ~~LOC~~ SOPN: PEN_INJECTOR | SUBCUTANEOUS | 3 refills | Status: DC

## 2020-07-16 NOTE — Telephone Encounter (Signed)
Updated prescription sent per last office note.

## 2020-07-19 NOTE — Telephone Encounter (Signed)
Patient called regarding his Robert [name] being updated to 100 ML (90 day) - I see where we said we were going to update that but it does not look like it was changed in the chart. Please advise. Patient asked to be called to let him know when we were able to complete this. [phone]

## 2020-07-20 NOTE — Telephone Encounter (Signed)
Spoke with patient and medication management addressed.

## 2020-08-08 ENCOUNTER — Other Ambulatory Visit: Payer: Self-pay | Admitting: *Deleted

## 2020-08-08 MED ORDER — HUMULIN R U-500 (CONCENTRATED) 500 UNIT/ML ~~LOC~~ SOLN: SUBCUTANEOUS | 1 refills | Status: DC

## 2020-08-08 NOTE — Progress Notes (Addendum)
Patient ID: Robert [name], male   DOB: 12/07/1960, 59 y.o.   MRN: [id number]     Reason for Appointment: Endocrinology follow-up  History of Present Illness   DIABETES diagnosis date:  2012  Previous history: His blood sugar has been difficult to control since onset. Initially was taking oral hypoglycemic drugs like glyburide but could not take metformin because of renal dysfunction. Since Victoza did not help his control he was started on Lantus and then U 500 insulin also His A1c has previously ranged from 7.8-9.8, the lowest level in 03/2013 He was on Victoza but because of his markedly decreased appetite and weight loss this was stopped in 9/15  Recent history:   Insulin regimen: Tresiba 100 units daily in am.,  Novolog or Humulin R U-500, 10-20 units Previously following 1: 5 carbohydrate ratio  Non-hypoglycemic insulin regimen: Victoza 1.8 mg daily  His A1c is 7.9 done at [facility], previously 8.6  OMNIPOD nsulin pump settings with U-500 insulin: Basal rate 0.8, carbohydrate ratio 1:20, correction factor I: 60  Current blood sugar patterns, management and problems identified:    He has been started on insulin pump yesterday but because he had taken [name] this has not been activated as yet  Also he did not take any insulin to cover his evening meal even though he thinks he had 60 g of carbs  He does not want to use the pump for boluses because he has a large supply of NovoLog which he wants to use  Previously had been told to go up on his [name] if his fasting readings were higher and even with 100 units he has had several readings in the 150 range in the morning.  Appears not to be checking blood sugars after meals until yesterday  As before he had been forgetting to take his mealtime insulin doses consistently but he feels that he was taking 10-20 units with what ever insulin he was able to find at the mealtime  Blood sugar after dinner was 177 without any  bolus insulin although had gone up to 200 after breakfast without any mealtime coverage also  Without any additional insulin last night his sugar was only 67 when he woke up this morning  Even with intake of 100 g of carbohydrate this morning his blood sugar is only 167 in the office today and basal rate is not active  His weight has fluctuated somewhat   Proper timing of medications in relation to meals: Yes.          Monitors blood glucose:  recently 1-2 times a day .    Glucometer: Accu-Chek         Blood Glucose readings from yesterday as above  PREVIOUS readings:  PRE-MEAL Fasting Lunch Dinner Bedtime Overall  Glucose range:  95-353  148, 115    95-396  Mean/median:  205     228   POST-MEAL PC Breakfast PC Lunch PC Dinner  Glucose range:   223-396  271-278  Mean/median:   273       Meals: 2-3 meals per day. 7-8 am and Noon and Dinner 5-7 PM .        Physical activity: exercise: Mostly yard work       Weight control: No  Wt Readings from Last 3 Encounters:  08/09/20 288 lb 12.8 oz (131 kg)  06/02/20 286 lb 3.2 oz (129.8 kg)  04/07/20 293 lb 12.8 oz (133.3 kg)  Complications:   nephropathy, neuropathy   Diabetes labs:   Lab Results  Component Value Date   HGBA1C 8.6 (H) 05/31/2020   HGBA1C 8.7 (H) 02/12/2020   HGBA1C 7.2 (H) 08/03/2019   Lab Results  Component Value Date   MICROALBUR 146.3 (H) 08/03/2019   LDLCALC 92 07/07/2019   CREATININE 1.76 (H) 05/31/2020    Lab Results  Component Value Date   FRUCTOSAMINE 375 (H) 03/31/2020   FRUCTOSAMINE 367 (H) 02/12/2020   FRUCTOSAMINE 281 04/29/2019     No visits with results within 1 Week(s) from this visit.  Latest known visit with results is:  Lab on 05/31/2020  Component Date Value Ref Range Status  . WBC 05/31/2020 8.8  4.0 - 10.5 K/uL Final  . RBC 05/31/2020 4.89  4.22 - 5.81 Mil/uL Final  . Platelets 05/31/2020 267.0  150 - 400 K/uL Final  . Hemoglobin 05/31/2020 13.8  13.0 - 17.0 g/dL  Final  . HCT 05/31/2020 41.6  39 - 52 % Final  . MCV 05/31/2020 85.1  78.0 - 100.0 fl Final  . MCHC 05/31/2020 33.1  30.0 - 36.0 g/dL Final  . RDW 05/31/2020 15.4  11.5 - 15.5 % Final  . Testosterone 05/31/2020 181.28* 300.00 - 890.00 ng/dL Final  . Sodium 05/31/2020 136  135 - 145 mEq/L Final  . Potassium 05/31/2020 4.8  3.5 - 5.1 mEq/L Final  . Chloride 05/31/2020 102  96 - 112 mEq/L Final  . CO2 05/31/2020 27  19 - 32 mEq/L Final  . Glucose, Bld 05/31/2020 283* 70 - 99 mg/dL Final  . BUN 05/31/2020 24* 6 - 23 mg/dL Final  . Creatinine, Ser 05/31/2020 1.76* 0.40 - 1.50 mg/dL Final  . Total Bilirubin 05/31/2020 1.0  0.2 - 1.2 mg/dL Final  . Alkaline Phosphatase 05/31/2020 67  39 - 117 U/L Final  . AST 05/31/2020 14  0 - 37 U/L Final  . ALT 05/31/2020 22  0 - 53 U/L Final  . Total Protein 05/31/2020 6.3  6.0 - 8.3 g/dL Final  . Albumin 05/31/2020 4.1  3.5 - 5.2 g/dL Final  . GFR 05/31/2020 39.56* >60.00 mL/min Final  . Calcium 05/31/2020 10.0  8.4 - 10.5 mg/dL Final  . Hgb A1c MFr Bld 05/31/2020 8.6* 4.6 - 6.5 % Final   Glycemic Control Guidelines for People with Diabetes:Non Diabetic:  <6%Goal of Therapy: <7%Additional Action Suggested:  >8%     OTHER problems: See review of systems    Allergies as of 08/09/2020   No Known Allergies     Medication List       Accurate as of August 09, 2020 10:23 AM. If you have any questions, ask your nurse or doctor.        STOP taking these medications   sulfamethoxazole-trimethoprim 800-160 MG tablet Commonly known as: BACTRIM DS Stopped by: Elayne Snare, MD     TAKE these medications   Accu-Chek FastClix Lancet Kit 1 each by Does not apply route daily.   Accu-Chek FastClix Lancets Misc Use lancets to check blood sugar 5 times daily.   amLODipine 10 MG tablet Commonly known as: NORVASC Take 1 tablet (10 mg total) by mouth daily.   aspirin EC 81 MG tablet Take 81 mg by mouth daily.   B-D UF III MINI PEN NEEDLES 31G X 5 MM  Misc Generic drug: Insulin Pen Needle USE 4 PER DAY TO INJECT INSULIN.   BD Hypodermic Needle 18G X 1" Misc Generic drug: NEEDLE (DISP) 18  G Use 2 needles once weekly for Testosterone medication. Use 1 needle to draw up medication and 2'nd needle to inject into muscle.   DULoxetine 30 MG capsule Commonly known as: CYMBALTA Take 60 mg by mouth daily.   Fifty50 Glucose Meter 2.0 w/Device Kit Use as directed   Accu-Chek Guide w/Device Kit 1 each by Does not apply route daily. Use meter to check blood sugar 5 times daily.   fluocinonide cream 0.05 % Commonly known as: LIDEX Apply 1 application topically daily as needed (Skin inflammation (hand, thigh, back)).   furosemide 20 MG tablet Commonly known as: LASIX Take 40 mg by mouth daily as needed for edema.   gabapentin 100 MG capsule Commonly known as: NEURONTIN Take 200-300 mg by mouth See admin instructions. 200 mg in the morning and 300 mg at bedtime   glucose blood test strip Commonly known as: Accu-Chek Guide 1 each by Other route as needed for other. Use as instructed to check blood sugar 5 times daily.   HUMULIN R 500 UNIT/ML injection Generic drug: insulin regular human CONCENTRATED Use max 200 units in pump every three days   Insulin Syringe-Needle U-100 31G X 5/16" 0.3 ML Misc Commonly known as: BD Insulin Syringe U/F Use to inject insulin 2-3 times per day   BD Insulin Syringe U/F 31G X 5/16" 1 ML Misc Generic drug: Insulin Syringe-Needle U-100 USE 3 TIMES A DAY WITH HUMULIN R   lactulose 10 GM/15ML solution Commonly known as: CHRONULAC Take 10 g by mouth daily as needed.   levothyroxine 100 MCG tablet Commonly known as: SYNTHROID TAKE 1 TABLET BY MOUTH EVERY DAY   lisinopril 20 MG tablet Commonly known as: ZESTRIL Take 20 mg by mouth 2 (two) times a day. Take 1 tablet ($RemoveB'20mg'XgQEeDVr$  total) by mouth twice daily.   metoprolol tartrate 50 MG tablet Commonly known as: LOPRESSOR TAKE 1 TABLET BY MOUTH TWICE A  DAY   mirabegron ER 50 MG Tb24 tablet Commonly known as: MYRBETRIQ Take by mouth.   mycophenolate 250 MG capsule Commonly known as: CELLCEPT Take 1,000 mg by mouth every 12 (twelve) hours.   NovoLOG FlexPen 100 UNIT/ML FlexPen Generic drug: insulin aspart Inject 50 Units into the skin 3 (three) times daily with meals.   Nulojix 250 MG Solr injection Generic drug: belatacept Inject 24.5 mLs into the vein every 28 (twenty-eight) days.   predniSONE 5 MG tablet Commonly known as: DELTASONE Take 5 mg by mouth daily with breakfast.   rosuvastatin 10 MG tablet Commonly known as: CRESTOR TAKE 1 TABLET (10 MG TOTAL) BY MOUTH DAILY. WILL ONLY PROVIDE 90 DAY SUPPLY. MUST CALL TO SCHEDULE APPT. What changed: how much to take   senna-docusate 8.6-50 MG tablet Commonly known as: Senokot-S Take 1 tablet by mouth as needed for mild constipation.   Syringe Luer Lock 3 ML Misc Use 1 syringe once weekly to inject Testosterone medication.   tadalafil 5 MG tablet Commonly known as: CIALIS Take by mouth.   tamsulosin 0.4 MG Caps capsule Commonly known as: FLOMAX Take 0.8 mg by mouth at bedtime.   testosterone cypionate 200 MG/ML injection Commonly known as: Depo-Testosterone Inject 0.5 mLs (100 mg total) into the muscle every 7 (seven) days.   traMADol 50 MG tablet Commonly known as: ULTRAM Take 1 tablet (50 mg total) by mouth every 6 (six) hours as needed. What changed:   how much to take  reasons to take this   traZODone 50 MG tablet Commonly known as: DESYREL  Take 50-100 mg by mouth at bedtime as needed for sleep.   Evaristo Bury FlexTouch 100 UNIT/ML FlexTouch Pen Generic drug: insulin degludec Inject 90 units under the skin once daily.   UNABLE TO FIND BiPAP: At bedtime   Victoza 18 MG/3ML Sopn Generic drug: liraglutide INJECT 1.8 MG UNDER THE SKIN ONCE DAILY   zolpidem 10 MG tablet Commonly known as: AMBIEN Take 10-20 mg by mouth at bedtime as needed for sleep.        Allergies: No Known Allergies  Past Medical History:  Diagnosis Date  . Anemia    low hgb at present  . Arthritis    FEET  . Blood transfusion without reported diagnosis 1974   kidney removed - L  . Cancer (HCC)    skin ca right shoulder, plastic dsyplasia, pre-Ca polpys removed on Colonoscopy- 07/2014  . Colon polyps 07/23/2014   Tubular adenomas x 5  . Constipation   . Diabetes mellitus without complication (HCC)    Type 2  . Difficult intubation    unsure of actual problem but it was during the January 28, 2015 procedure.  Marland Kitchen Dysrhythmia   . Eczema   . ESRD (end stage renal disease) (HCC)    hEMO mwf  . Headache(784.0)    slight dull h/a due kidney failure  . Hyperlipidemia   . Hypertension    SVT h/o , followed by Dr. Tresa Endo  . Hypothyroidism   . Macular degeneration   . Neuropathy    right hand and both feet  . PAT (paroxysmal atrial tachycardia) (HCC)   . PSVT (paroxysmal supraventricular tachycardia) (HCC)   . SBO (small bowel obstruction) (HCC)   . Shortness of breath    with exertion  . Sleep apnea 06/18/11   split-night sleep study- [facility]., BiPAP- 13-16  . Thyroid disease   . Umbilical hernia    will repair with thi surgery    Past Surgical History:  Procedure Laterality Date  . AV FISTULA PLACEMENT Right 02/25/2015   Procedure: RIGHT ARTERIOVENOUS (AV) FISTULA CREATION;  Surgeon: Nada Libman, MD;  Location: [facility] OR;  Service: Vascular;  Laterality: Right;  . AV FISTULA PLACEMENT Right 09/15/2015   Procedure: ARTERIOVENOUS (AV) FISTULA CREATION- RIGHT ARM;  Surgeon: Nada Libman, MD;  Location: [facility] OR;  Service: Vascular;  Laterality: Right;  . CAPD INSERTION N/A 05/18/2014   Procedure: LAPAROSCOPIC INSERTION CONTINUOUS AMBULATORY PERITONEAL DIALYSIS  (CAPD) CATHETER;  Surgeon: Axel Filler, MD;  Location: [facility] OR;  Service: General;  Laterality: N/A;  . COLONOSCOPY    . COLONOSCOPY WITH PROPOFOL N/A 07/12/2016    Procedure: COLONOSCOPY WITH PROPOFOL;  Surgeon: Rachael Fee, MD;  Location: [facility];  Service: Endoscopy;  Laterality: N/A;  . declotting of fistula  08/2015  . ESOPHAGOGASTRODUODENOSCOPY (EGD) WITH PROPOFOL N/A 07/12/2016   Procedure: ESOPHAGOGASTRODUODENOSCOPY (EGD) WITH PROPOFOL;  Surgeon: Rachael Fee, MD;  Location: [facility];  Service: Endoscopy;  Laterality: N/A;  . FISTULA SUPERFICIALIZATION Right 02/07/2016   Procedure: RIIGHT UPPER ARM FISTULA SUPERFICIALIZATION;  Surgeon: Sherren Kerns, MD;  [facility] OR;  Service: Vascular;  Laterality: Right;  . IJ catheter insertion    . INSERTION OF DIALYSIS CATHETER Right 02/25/2015   Procedure: INSERTION OF RIGHT INTERNAL JUGULAR DIALYSIS CATHETER;  Surgeon: Nada Libman, MD;  Location: [facility] OR;  Service: Vascular;  Laterality: Right;  . KIDNEY TRANSPLANT    . LAPAROSCOPIC REPOSITIONING CAPD CATHETER N/A 06/16/2014   Procedure: LAPAROSCOPIC REPOSITIONING CAPD  CATHETER;  Surgeon: Axel Filler, MD;  Location: [facility] OR;  Service: General;  Laterality: N/A;  . LAPAROSCOPY N/A 05/04/2015   Procedure: LAPAROSCOPY DIAGNOSTIC LYSIS OF ADHESIONS;  Surgeon: Abigail Miyamoto, MD;  Location: [facility] OR;  Service: General;  Laterality: N/A;  . MINOR REMOVAL OF PERITONEAL DIALYSIS CATHETER N/A 04/28/2015   Procedure:  REMOVAL OF PERITONEAL DIALYSIS CATHETER;  Surgeon: Abigail Miyamoto, MD;  Location: [facility] OR;  Service: General;  Laterality: N/A;  . NEPHRECTOMY Left 1974  . RENAL BIOPSY Right 2012  . REVISON OF ARTERIOVENOUS FISTULA Right 05/26/2015   Procedure: SUPERFICIALIZATION OF ARTERIOVENOUS FISTULA WITH SIDE BRANCH LIGATIONS;  Surgeon: Nada Libman, MD;  Location: [facility] OR;  Service: Vascular;  Laterality: Right;  . SKIN CANCER EXCISION     right shoulder  . SVT ABLATION N/A 11/23/2016   Procedure: SVT Ablation;  Surgeon: Will Jorja Loa, MD;  [facility]: [facility];  Service: Cardiovascular;  Laterality: N/A;  . UMBILICAL HERNIA  REPAIR N/A 05/18/2014   Procedure: HERNIA REPAIR UMBILICAL ADULT;  Surgeon: Axel Filler, MD;  Location: [facility] OR;  Service: General;  Laterality: N/A;  . UMBILICAL HERNIA REPAIR N/A 04/28/2015   Procedure: UMBILICAL HERNIA REPAIR WITH MESH;  Surgeon: Abigail Miyamoto, MD;  Location: [facility] OR;  Service: General;  Laterality: N/A;    Family History  Problem Relation Age of Onset  . Colon polyps Father   . Colon cancer Neg Hx   . Stomach cancer Neg Hx     Social History:  reports that he quit smoking about 7 years ago. His smoking use included cigars. He has never used smokeless tobacco. He reports current alcohol use. He reports that he does not use drugs.  Review of Systems:  Following is a copy of the previous note:  Hypertension:    His blood pressure has been managed by his nephrologist and transplant team monitors BP at home also  BP Readings from Last 3 Encounters:  08/09/20 128/78  06/02/20 (!) 150/78  04/07/20 (!) 142/70    Lipids:  His LDL is below 100 with 10 mg Crestor Has no evidence of vascular disease Last LDL at [facility] 92    Lab Results  Component Value Date   CHOL 158 07/07/2019   CHOL 153 04/29/2019   CHOL 166 07/10/2018   Lab Results  Component Value Date   HDL 47 07/07/2019   HDL 54.30 04/29/2019   HDL 54.20 07/10/2018   Lab Results  Component Value Date   LDLCALC 92 07/07/2019   LDLCALC 80 04/29/2019   LDLCALC 95 07/10/2018   Lab Results  Component Value Date   TRIG 93 07/07/2019   TRIG 92.0 04/29/2019   TRIG 86.0 07/10/2018   Lab Results  Component Value Date   CHOLHDL 3 04/29/2019   CHOLHDL 3 07/10/2018   CHOLHDL 3 05/28/2017   No results found for: LDLDIRECT   HYPOTHYROIDISM: He has had long-standing hypothyroidism  Has been taking his 100 g levothyroxine before breakfast daily  TSH normal  Lab Results  Component Value Date   TSH 1.33 02/12/2020       HYPOGONADISM: 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 metabolic syndrome and previously had subjectively improved with supplementation using Fortesta  He was having fatigue and decreased motivation and because of his free testosterone level being low at 3.7 he was prescribed AndroGel in February 2019 His testosterone level has been inconsistent because of difficulty with remembering to apply his testosterone  gel  Testosterone level is recently persistently low  Still complains of tiredness most of the time, also has had some depression    Lab Results  Component Value Date   TESTOSTERONE 181.28 (L) 05/31/2020   TESTOSTERONE 243.23 (L) 03/31/2020   TESTOSTERONE 241.91 (L) 02/12/2020    He has depression but only taking 30 mg of Cymbalta; followed by PCP  Diabetic neuropathy: Has mild sensory loss on his last exam    Examination:   BP 128/78   Pulse 89   Ht $R'5\' 10"'SM$  (1.778 m)   Wt 288 lb 12.8 oz (131 kg)   SpO2 98%   BMI 41.44 kg/m   Body mass index is 41.44 kg/m.    ASSESSMENT/ PLAN:    Diabetes type 2 with obesity, insulin-requiring:   See history of present illness for detailed discussion of his current management, problems identified and sugar patterns on his meter download  Has been on Tresiba, Victoza and mealtime either NovoLog or U-500 insulin with syringe  A1c is most recently 7.9  He is just starting the insulin pump but basal has not been activated as yet Currently cannot afford the freestyle libre system  Recommendations:  He will start bolusing for his meals using his NovoLog and 1: 3 carbohydrate ratio today  Today when his premeal blood sugar is over 150 he can activate his basal insulin which is currently suspended, likely has still active [name] in his system especially with his renal insufficiency  Check blood sugars frequently and follow-up tomorrow  As a precaution we will reduce his insulin to 0.7 and this was done under supervision today   Patient Instructions  Take  Novolog at meals: divide carbs by 3  When Premeal sugar is over 150 will start pump      Aasir Daigler 08/09/2020, 10:23 AM

## 2020-08-09 ENCOUNTER — Other Ambulatory Visit: Payer: Self-pay

## 2020-08-09 ENCOUNTER — Encounter: Payer: Self-pay | Admitting: Endocrinology

## 2020-08-09 ENCOUNTER — Ambulatory Visit (INDEPENDENT_AMBULATORY_CARE_PROVIDER_SITE_OTHER): Payer: BC Managed Care – PPO | Admitting: Endocrinology

## 2020-08-09 VITALS — BP 128/78 | HR 89 | Ht 70.0 in | Wt 288.8 lb

## 2020-08-09 DIAGNOSIS — E1165 Type 2 diabetes mellitus with hyperglycemia: Secondary | ICD-10-CM

## 2020-08-09 DIAGNOSIS — Z794 Long term (current) use of insulin: Secondary | ICD-10-CM | POA: Diagnosis not present

## 2020-08-09 NOTE — Patient Instructions (Signed)
Take Novolog at meals: divide carbs by 3  When Premeal sugar is over 150 will start pump

## 2020-08-10 ENCOUNTER — Telehealth: Payer: Self-pay | Admitting: Endocrinology

## 2020-08-10 ENCOUNTER — Ambulatory Visit (INDEPENDENT_AMBULATORY_CARE_PROVIDER_SITE_OTHER): Payer: BC Managed Care – PPO | Admitting: Endocrinology

## 2020-08-10 DIAGNOSIS — E291 Testicular hypofunction: Secondary | ICD-10-CM | POA: Diagnosis not present

## 2020-08-10 DIAGNOSIS — Z794 Long term (current) use of insulin: Secondary | ICD-10-CM

## 2020-08-10 DIAGNOSIS — E1165 Type 2 diabetes mellitus with hyperglycemia: Secondary | ICD-10-CM

## 2020-08-10 NOTE — Progress Notes (Signed)
Patient ID: Robert [name], male   DOB: Jun 11, 1961, 59 y.o.   MRN: [id number]     Reason for Appointment: Endocrinology follow-up  History of Present Illness   DIABETES diagnosis date:  2012  Previous history: His blood sugar has been difficult to control since onset. Initially was taking oral hypoglycemic drugs like glyburide but could not take metformin because of renal dysfunction. Since Victoza did not help his control he was started on Lantus and then U 500 insulin also His A1c has previously ranged from 7.8-9.8, the lowest level in 03/2013 He was on Victoza but because of his markedly decreased appetite and weight loss this was stopped in 9/15  Recent history:   Insulin regimen: Tresiba 100 units daily in am.,  Novolog or Humulin R U-500, 10-20 units Previously following 1: 5 carbohydrate ratio  Non-hypoglycemic insulin regimen: Victoza 1.8 mg daily  His A1c is 7.9 done at [facility], previously 8.6  OMNIPOD pump settings with U-500 insulin: Basal rate 0.8, carbohydrate ratio 1:20, correction factor I: 60 Pump and bolus insulin with NovoLog FlexPen 10 units - 20 units  Current blood sugar patterns, management and problems identified:    He has been started on insulin pump 2 days ago and a basal rate was activated yesterday  His blood sugar was relatively low yesterday morning at 67 given without any active basal rate but subsequently went up to about 201 a correction bolus with his pump  At dinnertime blood sugar was still 191 and he took 20 units injection  However did not check his blood sugar after dinner  Blood sugar was 63 during the night and he had 60 g of carbohydrate to treat the low sugar without any insulin bolus and blood sugar was up to 212 at about 9:30 AM  With this he took 10 units NovoLog injection but blood sugar by 2 PM was down to 89 without any food intake   Proper timing of medications in relation to meals: Yes.          Monitors blood  glucose:  recently 1-2 times a day .    Glucometer: Accu-Chek         Blood Glucose readings from yesterday as above  PREVIOUS readings:  PRE-MEAL Fasting Lunch Dinner Bedtime Overall  Glucose range:  95-353  148, 115    95-396  Mean/median:  205     228   POST-MEAL PC Breakfast PC Lunch PC Dinner  Glucose range:   223-396  271-278  Mean/median:   273       Meals: 2-3 meals per day. 7-8 am and Noon and Dinner 5-7 PM .        Physical activity: exercise: Mostly yard work       Weight control: No  Wt Readings from Last 3 Encounters:  08/09/20 288 lb 12.8 oz (131 kg)  06/02/20 286 lb 3.2 oz (129.8 kg)  04/07/20 293 lb 12.8 oz (119.4 kg)        Complications:   nephropathy, neuropathy   Diabetes labs:   Lab Results  Component Value Date   HGBA1C 8.6 (H) 05/31/2020   HGBA1C 8.7 (H) 02/12/2020   HGBA1C 7.2 (H) 08/03/2019   Lab Results  Component Value Date   MICROALBUR 146.3 (H) 08/03/2019   LDLCALC 92 07/07/2019   CREATININE 1.76 (H) 05/31/2020    Lab Results  Component Value Date   FRUCTOSAMINE 375 (H) 03/31/2020   FRUCTOSAMINE 367 (H) 02/12/2020  FRUCTOSAMINE 281 04/29/2019     No visits with results within 1 Week(s) from this visit.  Latest known visit with results is:  Lab on 05/31/2020  Component Date Value Ref Range Status   WBC 05/31/2020 8.8  4.0 - 10.5 K/uL Final   RBC 05/31/2020 4.89  4.22 - 5.81 Mil/uL Final   Platelets 05/31/2020 267.0  150 - 400 K/uL Final   Hemoglobin 05/31/2020 13.8  13.0 - 17.0 g/dL Final   HCT 58/28/3323 41.6  39 - 52 % Final   MCV 05/31/2020 85.1  78.0 - 100.0 fl Final   MCHC 05/31/2020 33.1  30.0 - 36.0 g/dL Final   RDW 34/86/0194 15.4  11.5 - 15.5 % Final   Testosterone 05/31/2020 181.28* 300.00 - 890.00 ng/dL Final   Sodium 78/65/4561 136  135 - 145 mEq/L Final   Potassium 05/31/2020 4.8  3.5 - 5.1 mEq/L Final   Chloride 05/31/2020 102  96 - 112 mEq/L Final   CO2 05/31/2020 27  19 - 32 mEq/L Final    Glucose, Bld 05/31/2020 283* 70 - 99 mg/dL Final   BUN 32/73/5302 24* 6 - 23 mg/dL Final   Creatinine, Ser 05/31/2020 1.76* 0.40 - 1.50 mg/dL Final   Total Bilirubin 05/31/2020 1.0  0.2 - 1.2 mg/dL Final   Alkaline Phosphatase 05/31/2020 67  39 - 117 U/L Final   AST 05/31/2020 14  0 - 37 U/L Final   ALT 05/31/2020 22  0 - 53 U/L Final   Total Protein 05/31/2020 6.3  6.0 - 8.3 g/dL Final   Albumin 95/03/4627 4.1  3.5 - 5.2 g/dL Final   GFR 80/55/9860 39.56* >60.00 mL/min Final   Calcium 05/31/2020 10.0  8.4 - 10.5 mg/dL Final   Hgb X0V MFr Bld 05/31/2020 8.6* 4.6 - 6.5 % Final   Glycemic Control Guidelines for People with Diabetes:Non Diabetic:  <6%Goal of Therapy: <7%Additional Action Suggested:  >8%     OTHER problems: See review of systems    Allergies as of 08/10/2020   No Known Allergies     Medication List       Accurate as of August 10, 2020 11:59 PM. If you have any questions, ask your nurse or doctor.        Accu-Chek [employer] Kit 1 each by Does not apply route daily.   Accu-Chek [employer] Lancets Misc Use lancets to check blood sugar 5 times daily.   amLODipine 10 MG tablet Commonly known as: NORVASC Take 1 tablet (10 mg total) by mouth daily.   aspirin EC 81 MG tablet Take 81 mg by mouth daily.   B-D UF III MINI PEN NEEDLES 31G X 5 MM Misc Generic drug: Insulin Pen Needle USE 4 PER DAY TO INJECT INSULIN.   BD Hypodermic Needle 18G X 1" Misc Generic drug: NEEDLE (DISP) 18 G Use 2 needles once weekly for Testosterone medication. Use 1 needle to draw up medication and 2'nd needle to inject into muscle.   DULoxetine 30 MG capsule Commonly known as: CYMBALTA Take 60 mg by mouth daily.   Fifty50 Glucose Meter 2.0 w/Device Kit Use as directed   Accu-Chek Guide w/Device Kit 1 each by Does not apply route daily. Use meter to check blood sugar 5 times daily.   fluocinonide cream 0.05 % Commonly known as: LIDEX Apply 1 application  topically daily as needed (Skin inflammation (hand, thigh, back)).   furosemide 20 MG tablet Commonly known as: LASIX Take 40 mg by mouth daily as  needed for edema.   gabapentin 100 MG capsule Commonly known as: NEURONTIN Take 200-300 mg by mouth See admin instructions. 200 mg in the morning and 300 mg at bedtime   glucose blood test strip Commonly known as: Accu-Chek Guide 1 each by Other route as needed for other. Use as instructed to check blood sugar 5 times daily.   HUMULIN R 500 UNIT/ML injection Generic drug: insulin regular human CONCENTRATED Use max 200 units in pump every three days   Insulin Syringe-Needle U-100 31G X 5/16" 0.3 ML Misc Commonly known as: BD Insulin Syringe U/F Use to inject insulin 2-3 times per day   BD Insulin Syringe U/F 31G X 5/16" 1 ML Misc Generic drug: Insulin Syringe-Needle U-100 USE 3 TIMES A DAY WITH HUMULIN R   lactulose 10 GM/15ML solution Commonly known as: CHRONULAC Take 10 g by mouth daily as needed.   levothyroxine 100 MCG tablet Commonly known as: SYNTHROID TAKE 1 TABLET BY MOUTH EVERY DAY   lisinopril 20 MG tablet Commonly known as: ZESTRIL Take 20 mg by mouth 2 (two) times a day. Take 1 tablet ($RemoveB'20mg'KQddrdev$  total) by mouth twice daily.   metoprolol tartrate 50 MG tablet Commonly known as: LOPRESSOR TAKE 1 TABLET BY MOUTH TWICE A DAY   mirabegron ER 50 MG Tb24 tablet Commonly known as: MYRBETRIQ Take by mouth.   mycophenolate 250 MG capsule Commonly known as: CELLCEPT Take 1,000 mg by mouth every 12 (twelve) hours.   NovoLOG FlexPen 100 UNIT/ML FlexPen Generic drug: insulin aspart Inject 50 Units into the skin 3 (three) times daily with meals.   Nulojix 250 MG Solr injection Generic drug: belatacept Inject 24.5 mLs into the vein every 28 (twenty-eight) days.   predniSONE 5 MG tablet Commonly known as: DELTASONE Take 5 mg by mouth daily with breakfast.   rosuvastatin 10 MG tablet Commonly known as: CRESTOR TAKE 1  TABLET (10 MG TOTAL) BY MOUTH DAILY. WILL ONLY PROVIDE 90 DAY SUPPLY. MUST CALL TO SCHEDULE APPT. What changed: how much to take   senna-docusate 8.6-50 MG tablet Commonly known as: Senokot-S Take 1 tablet by mouth as needed for mild constipation.   Syringe Luer Lock 3 ML Misc Use 1 syringe once weekly to inject Testosterone medication.   tadalafil 5 MG tablet Commonly known as: CIALIS Take by mouth.   tamsulosin 0.4 MG Caps capsule Commonly known as: FLOMAX Take 0.8 mg by mouth at bedtime.   testosterone cypionate 200 MG/ML injection Commonly known as: Depo-Testosterone Inject 0.5 mLs (100 mg total) into the muscle every 7 (seven) days.   traMADol 50 MG tablet Commonly known as: ULTRAM Take 1 tablet (50 mg total) by mouth every 6 (six) hours as needed. What changed:   how much to take  reasons to take this   traZODone 50 MG tablet Commonly known as: DESYREL Take 50-100 mg by mouth at bedtime as needed for sleep.   [name] FlexTouch 100 UNIT/ML FlexTouch Pen Generic drug: insulin degludec Inject 90 units under the skin once daily.   UNABLE TO FIND BiPAP: At bedtime   Victoza 18 MG/3ML Sopn Generic drug: liraglutide INJECT 1.8 MG UNDER THE SKIN ONCE DAILY   zolpidem 10 MG tablet Commonly known as: AMBIEN Take 10-20 mg by mouth at bedtime as needed for sleep.       Allergies: No Known Allergies  Past Medical History:  Diagnosis Date   Anemia    low hgb at present   Arthritis    FEET  Blood transfusion without reported diagnosis 1974   kidney removed - L   Cancer ([place])    skin ca right shoulder, plastic dsyplasia, pre-Ca polpys removed on Colonoscopy- 07/2014   Colon polyps 07/23/2014   Tubular adenomas x 5   Constipation    Diabetes mellitus without complication ([place])    Type 2   Difficult intubation    unsure of actual problem but it was during the January 28, 2015 procedure.   Dysrhythmia    Eczema    ESRD (end stage renal disease)  (HCC)    hEMO mwf   Headache(784.0)    slight dull h/a due kidney failure   Hyperlipidemia    Hypertension    SVT h/o , followed by Dr. Claiborne Billings   Hypothyroidism    Macular degeneration    Neuropathy    right hand and both feet   PAT (paroxysmal atrial tachycardia) (HCC)    PSVT (paroxysmal supraventricular tachycardia) (HCC)    SBO (small bowel obstruction) (HCC)    Shortness of breath    with exertion   Sleep apnea 06/18/11   split-night sleep study- [facility]., BiPAP- 13-16   Thyroid disease    Umbilical hernia    will repair with thi surgery    Past Surgical History:  Procedure Laterality Date   AV FISTULA PLACEMENT Right 02/25/2015   Procedure: RIGHT ARTERIOVENOUS (AV) FISTULA CREATION;  Surgeon: Serafina Mitchell, MD;  Location: [place];  Service: Vascular;  Laterality: Right;   AV FISTULA PLACEMENT Right 09/15/2015   Procedure: ARTERIOVENOUS (AV) FISTULA CREATION- RIGHT ARM;  Surgeon: Serafina Mitchell, MD;  Location: [place];  Service: Vascular;  Laterality: Right;   CAPD INSERTION N/A 05/18/2014   Procedure: LAPAROSCOPIC INSERTION CONTINUOUS AMBULATORY PERITONEAL DIALYSIS  (CAPD) CATHETER;  Surgeon: Ralene Ok, MD;  Location: [place];  Service: General;  Laterality: N/A;   COLONOSCOPY     COLONOSCOPY WITH PROPOFOL N/A 07/12/2016   Procedure: COLONOSCOPY WITH PROPOFOL;  Surgeon: [name], MD;  Location: [facility];  Service: Endoscopy;  Laterality: N/A;   declotting of fistula  08/2015   ESOPHAGOGASTRODUODENOSCOPY (EGD) WITH PROPOFOL N/A 07/12/2016   Procedure: ESOPHAGOGASTRODUODENOSCOPY (EGD) WITH PROPOFOL;  Surgeon: [name], MD;  Location: [facility];  Service: Endoscopy;  Laterality: N/A;   FISTULA SUPERFICIALIZATION Right 02/07/2016   Procedure: RIIGHT UPPER ARM FISTULA SUPERFICIALIZATION;  Surgeon: Elam Dutch, MD;  Location: [place];  Service: Vascular;  Laterality: Right;   IJ catheter insertion     INSERTION  OF DIALYSIS CATHETER Right 02/25/2015   Procedure: INSERTION OF RIGHT INTERNAL JUGULAR DIALYSIS CATHETER;  Surgeon: Serafina Mitchell, MD;  Location: [place];  Service: Vascular;  Laterality: Right;   KIDNEY TRANSPLANT     LAPAROSCOPIC REPOSITIONING CAPD CATHETER N/A 06/16/2014   Procedure: LAPAROSCOPIC REPOSITIONING CAPD CATHETER;  Surgeon: Ralene Ok, MD;  Location: [place];  Service: General;  Laterality: N/A;   LAPAROSCOPY N/A 05/04/2015   Procedure: LAPAROSCOPY DIAGNOSTIC LYSIS OF ADHESIONS;  Surgeon: Coralie Keens, MD;  Location: [place];  Service: General;  Laterality: N/A;   MINOR REMOVAL OF PERITONEAL DIALYSIS CATHETER N/A 04/28/2015   Procedure:  REMOVAL OF PERITONEAL DIALYSIS CATHETER;  Surgeon: Coralie Keens, MD;  Location: [place];  Service: General;  Laterality: N/A;   NEPHRECTOMY Left 1974   RENAL BIOPSY Right 2012   REVISON OF ARTERIOVENOUS FISTULA Right 05/26/2015   Procedure: SUPERFICIALIZATION OF ARTERIOVENOUS FISTULA WITH SIDE BRANCH LIGATIONS;  Surgeon: Serafina Mitchell,  MD;  Location: [facility];  Service: Vascular;  Laterality: Right;   SKIN CANCER EXCISION     right shoulder   SVT ABLATION N/A 11/23/2016   Procedure: SVT Ablation;  Surgeon: Will Jorja Loa, MD;  Location: [facility];  Service: Cardiovascular;  Laterality: N/A;   UMBILICAL HERNIA REPAIR N/A 05/18/2014   Procedure: HERNIA REPAIR UMBILICAL ADULT;  Surgeon: Axel Filler, MD;  [facility];  Service: General;  Laterality: N/A;   UMBILICAL HERNIA REPAIR N/A 04/28/2015   Procedure: UMBILICAL HERNIA REPAIR WITH MESH;  Surgeon: Abigail Miyamoto, MD;  Location[facility];  Service: General;  Laterality: N/A;    Family History  Problem Relation Age of Onset   Colon polyps Father    Colon cancer Neg Hx    Stomach cancer Neg Hx     Social History:  reports that he quit smoking about 7 years ago. His smoking use included cigars. He has never used smokeless tobacco. He reports current alcohol use.  He reports that he does not use drugs.  Review of Systems:  Following is a copy of the previous note:  Hypertension:    His blood pressure has been managed by his nephrologist and transplant team monitors BP at home also  BP Readings from Last 3 Encounters:  08/09/20 128/78  06/02/20 (!) 150/78  04/07/20 (!) 142/70    Lipids:  His LDL is below 100 with 10 mg Crestor Has no evidence of vascular disease Last LDL at [facility] was 92    Lab Results  Component Value Date   CHOL 158 07/07/2019   CHOL 153 04/29/2019   CHOL 166 07/10/2018   Lab Results  Component Value Date   HDL 47 07/07/2019   HDL 54.30 04/29/2019   HDL 54.20 07/10/2018   Lab Results  Component Value Date   LDLCALC 92 07/07/2019   LDLCALC 80 04/29/2019   LDLCALC 95 07/10/2018   Lab Results  Component Value Date   TRIG 93 07/07/2019   TRIG 92.0 04/29/2019   TRIG 86.0 07/10/2018   Lab Results  Component Value Date   CHOLHDL 3 04/29/2019   CHOLHDL 3 07/10/2018   CHOLHDL 3 05/28/2017   No results found for: LDLDIRECT   HYPOTHYROIDISM: He has had long-standing hypothyroidism  Has been taking his 100 g levothyroxine before breakfast daily  TSH normal  Lab Results  Component Value Date   TSH 1.33 02/12/2020       HYPOGONADISM: 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 metabolic syndrome and previously had subjectively improved with supplementation using Fortesta  He was having fatigue and decreased motivation and because of his free testosterone level being low at 3.7 he was prescribed AndroGel in February 2019 His testosterone level has been inconsistent because of difficulty with remembering to apply his testosterone gel  Testosterone level is recently persistently low  Still complains of tiredness most of the time, also has had some depression    Lab Results  Component Value Date   TESTOSTERONE 181.28 (L) 05/31/2020   TESTOSTERONE 243.23 (L) 03/31/2020    TESTOSTERONE 241.91 (L) 02/12/2020    He has depression but only taking 30 mg of Cymbalta; followed by PCP  Diabetic neuropathy: Has mild sensory loss on his last exam    Examination:   There were no vitals taken for this visit.  There is no height or weight on file to calculate BMI.    ASSESSMENT/ PLAN:    Diabetes type 2  with obesity, insulin-requiring:   See history of present illness for detailed discussion of his current management, problems identified and sugar patterns on his meter download  Has been on basal insulin with U-500, Victoza and mealtime insulin using NovoLog FlexPen currently  A1c is most recently 7.9  He is starting the insulin pump this week and basal has been activated since yesterday midday With this he appears to have had a low sugar during the night but difficult to assess his basal rate for the daytime since he is using U-500 insulin Taking boluses with NovoLog but data is incomplete to assess postprandial readings Currently cannot afford the freestyle libre system  Recommendations:  Reduce basal rate between 10 PM and 5 AM down to 0.5 and continue 0.7 the rest of the day  Settings were changed in the office as he is still uncomfortable making all the changes himself  Check more readings after 2 hours of eating and try to adjust doses of mealtime coverage based on carbohydrate with 1:3 ratio  Correction factor I: 20  Given detailed information on appropriate treatment of hypoglycemia  Regular exercise as tolerated  Follow-up in about a month unless having unusually high or low readings   Patient Instructions  Correction factor Novolog 1 unit for $Remove'20mg'LQnkfsZ$  over 100 glucose target  Treat low sugars with juice/coke      Elayne Snare 08/11/2020, 8:42 AM

## 2020-08-10 NOTE — Telephone Encounter (Signed)
Provider requested ths information before patients appointment 2 1:15 PM - Provider had indicated that Robert [name] may not have to come in today based on this information etc   Date Time Reading Notes       08/09/20 1:00 PM 201   08/09/20 5:35 PM 191 Before evening meal       08/10/20 2:50 AM 53   08/10/20 5:30 AM 184  after 60 grams of carbs  08/10/20 9:47 AM  212 b/4 Breakfast                             Please advise at [phone]

## 2020-08-10 NOTE — Telephone Encounter (Signed)
Will need to make adjustments with his pump and review in detail, needs to come in

## 2020-08-10 NOTE — Patient Instructions (Addendum)
Correction factor Novolog 1 unit for 20mg  over 100 glucose target  Treat low sugars with juice/coke

## 2020-08-11 NOTE — Telephone Encounter (Signed)
Patient aware and appointment scheduled.  

## 2020-08-12 ENCOUNTER — Telehealth: Payer: Self-pay | Admitting: Orthotics

## 2020-08-12 NOTE — Telephone Encounter (Signed)
Tried to return call needing dx codes;  Unable to leave message.

## 2020-08-15 ENCOUNTER — Telehealth: Payer: Self-pay | Admitting: Podiatry

## 2020-08-15 NOTE — Telephone Encounter (Signed)
Pt left message asking for the codes for diabetic shoes so he can call the insurance to see what they would cover. He is trying to get them this year.  I returned call and left message the codes we use for diabetic shoes is A5500 X2 for shoes and A5514 X6 for the diabetic inserts. I asked him to call me back if any further questions.

## 2020-08-16 ENCOUNTER — Other Ambulatory Visit: Payer: Self-pay | Admitting: *Deleted

## 2020-08-16 ENCOUNTER — Telehealth: Payer: Self-pay | Admitting: Podiatry

## 2020-08-16 ENCOUNTER — Telehealth: Payer: Self-pay

## 2020-08-16 MED ORDER — ROSUVASTATIN CALCIUM 10 MG PO TABS
10.0000 mg | ORAL_TABLET | Freq: Every day | ORAL | 1 refills | Status: DC
Start: 2020-08-16 — End: 2020-09-14

## 2020-08-16 NOTE — Telephone Encounter (Signed)
Rx has been sent, no answer when I tried to call the patient

## 2020-08-16 NOTE — Telephone Encounter (Signed)
New message    Pt c/o medication issue:  1. Name of Medication: rosuvastatin (CRESTOR) 10 MG tablet  2. How are you currently taking this medication (dosage and times per day)? One time a day   3. Are you having a reaction (difficulty breathing--STAT)? No   4. What is your medication issue? Patient PCP Dr. Osborne Casco wants him to double medication please advise    5. [facility] [address] in [place]   6. Patient wants to discuss needles gauges asking for a call back.

## 2020-08-16 NOTE — Telephone Encounter (Signed)
Pt called stating he has left messages and no one has returned his call. He said he wanted to go a different route and wanted to talk to Dr Neomia Dear nurse. He stated he does not have time to wait for a call back.  Pt is wanting to proceed with the diabetic shoes and does not care about the cost.  I explained to pt that I left message yesterday for him with the codes for the diabetic shoes and inserts that he requested but I could give them to them. He stated he did not receive them. I also explained that Dr Dwyane Dee had put a not in the [employer]est[employer] the company we get our shoes from that they were to send the paperwork once pt was seen and we have not received it. I told pt I would resend it now.

## 2020-08-24 ENCOUNTER — Ambulatory Visit: Payer: BC Managed Care – PPO | Admitting: Podiatry

## 2020-08-31 ENCOUNTER — Encounter: Payer: Self-pay | Admitting: Endocrinology

## 2020-08-31 ENCOUNTER — Ambulatory Visit (INDEPENDENT_AMBULATORY_CARE_PROVIDER_SITE_OTHER): Payer: BC Managed Care – PPO | Admitting: Endocrinology

## 2020-08-31 ENCOUNTER — Other Ambulatory Visit: Payer: Self-pay

## 2020-08-31 VITALS — BP 138/94 | HR 74 | Ht 70.0 in | Wt 294.4 lb

## 2020-08-31 DIAGNOSIS — E291 Testicular hypofunction: Secondary | ICD-10-CM | POA: Diagnosis not present

## 2020-08-31 DIAGNOSIS — E039 Hypothyroidism, unspecified: Secondary | ICD-10-CM

## 2020-08-31 DIAGNOSIS — Z794 Long term (current) use of insulin: Secondary | ICD-10-CM

## 2020-08-31 DIAGNOSIS — E1165 Type 2 diabetes mellitus with hyperglycemia: Secondary | ICD-10-CM | POA: Diagnosis not present

## 2020-08-31 DIAGNOSIS — E782 Mixed hyperlipidemia: Secondary | ICD-10-CM | POA: Diagnosis not present

## 2020-08-31 LAB — T4, FREE: Free T4: 0.92 ng/dL (ref 0.60–1.60)

## 2020-08-31 LAB — LIPID PANEL
Cholesterol: 147 mg/dL (ref 0–200)
HDL: 53.4 mg/dL (ref >39.00)
LDL Cholesterol: 75 mg/dL (ref 0–99)
NonHDL: 93.99
Total CHOL/HDL Ratio: 3
Triglycerides: 97 mg/dL (ref 0.0–149.0)
VLDL: 19.4 mg/dL (ref 0.0–40.0)

## 2020-08-31 LAB — BASIC METABOLIC PANEL
BUN: 28 mg/dL — ABNORMAL HIGH (ref 6–23)
CO2: 28 mEq/L (ref 19–32)
Calcium: 10.4 mg/dL (ref 8.4–10.5)
Chloride: 105 mEq/L (ref 96–112)
Creatinine, Ser: 1.73 mg/dL — ABNORMAL HIGH (ref 0.40–1.50)
GFR: 42.14 mL/min — ABNORMAL LOW (ref >60.00)
Glucose, Bld: 103 mg/dL — ABNORMAL HIGH (ref 70–99)
Potassium: 4 mEq/L (ref 3.5–5.1)
Sodium: 143 mEq/L (ref 135–145)

## 2020-08-31 LAB — TSH: TSH: 1.25 u[IU]/mL (ref 0.35–4.50)

## 2020-08-31 LAB — TESTOSTERONE: Testosterone: 267.85 ng/dL — ABNORMAL LOW (ref 300.00–890.00)

## 2020-08-31 NOTE — Progress Notes (Signed)
Patient ID: Robert [name], male   DOB: 1961/07/02, 59 y.o.   MRN: [id number]     Reason for Appointment: Endocrinology follow-up  History of Present Illness   DIABETES diagnosis date:  2012  Previous history: His blood sugar has been difficult to control since onset. Initially was taking oral hypoglycemic drugs like glyburide but could not take metformin because of renal dysfunction. Since Victoza did not help his control he was started on Lantus and then U 500 insulin also His A1c has previously ranged from 7.8-9.8, the lowest level in 03/2013 He was on Victoza but because of his markedly decreased appetite and weight loss this was stopped in 9/15  Recent history:   Non-hypoglycemic insulin regimen: was on Victoza 1.8 mg daily  His A1c is 7.9 in 9/21 done at [facility], previously 8.6  OMNIPOD pump settings with U-500 insulin:  Basal rate 12 AM = 0.5, 5 AM = 0.7 and 10 PM = 0.5 Carbohydrate ratio 1:20, correction factor I: 60 Bolus insulin with NovoLog FlexPen 10 units - 20 units  Current blood sugar patterns, management and problems identified:    He has been started on the Omni pod pump about 3 weeks ago  He is still doing injections with the NovoLog because he has a large supply of the insulin pens  On his own he stopped the Victoza when he started the pump because of cost and also he thinks it was causing constipation  His blood sugars are quite variable but no consistent pattern  Also blood sugar monitoring in the last week has been somewhat less frequent  He says that at times he is going off his diet and eating a lot of carbohydrate  Not clear if he is using carbohydrate counting to figure out his NovoLog and not entering the information in the pump  Blood sugars are generally higher midday and averaging nearly 200  However not always checking fasting readings; occasionally will check sugars during the night also  Lowest blood sugar in the morning 61  possibly from taking extra insulin during the night  Also difficult to assess postprandial readings and these are appearing to be inconsistent at night  He has gained weight   Proper timing of medications in relation to meals: Yes.          Monitors blood glucose:  recently 1-2 times a day .    Glucometer: Accu-Chek         Blood Glucose readings from insulin pump as follows   PRE-MEAL Fasting Lunch Dinner Bedtime Overall  Glucose range:  61-178  132-351  63-268  126-272   Mean/median:  140  198  172  164  166     PREVIOUS readings:  PRE-MEAL Fasting Lunch Dinner Bedtime Overall  Glucose range:  95-353  148, 115    95-396  Mean/median:  205     228   POST-MEAL PC Breakfast PC Lunch PC Dinner  Glucose range:   223-396  271-278  Mean/median:   273       Meals: 2-3 meals per day. 7-8 am and Noon and Dinner 5-7 PM .        Physical activity: exercise: Mostly yard work       Weight control: No  Wt Readings from Last 3 Encounters:  08/31/20 294 lb 6.4 oz (133.5 kg)  08/09/20 288 lb 12.8 oz (131 kg)  06/02/20 286 lb 3.2 oz (129.8 kg)  Complications:   nephropathy, neuropathy   Diabetes labs:   Lab Results  Component Value Date   HGBA1C 8.6 (H) 05/31/2020   HGBA1C 8.7 (H) 02/12/2020   HGBA1C 7.2 (H) 08/03/2019   Lab Results  Component Value Date   MICROALBUR 146.3 (H) 08/03/2019   LDLCALC 92 07/07/2019   CREATININE 1.76 (H) 05/31/2020    Lab Results  Component Value Date   FRUCTOSAMINE 375 (H) 03/31/2020   FRUCTOSAMINE 367 (H) 02/12/2020   FRUCTOSAMINE 281 04/29/2019     No visits with results within 1 Week(s) from this visit.  Latest known visit with results is:  Lab on 05/31/2020  Component Date Value Ref Range Status  . WBC 05/31/2020 8.8  4.0 - 10.5 K/uL Final  . RBC 05/31/2020 4.89  4.22 - 5.81 Mil/uL Final  . Platelets 05/31/2020 267.0  150 - 400 K/uL Final  . Hemoglobin 05/31/2020 13.8  13.0 - 17.0 g/dL Final  . HCT 05/31/2020 41.6  39  - 52 % Final  . MCV 05/31/2020 85.1  78.0 - 100.0 fl Final  . MCHC 05/31/2020 33.1  30.0 - 36.0 g/dL Final  . RDW 05/31/2020 15.4  11.5 - 15.5 % Final  . Testosterone 05/31/2020 181.28* 300.00 - 890.00 ng/dL Final  . Sodium 05/31/2020 136  135 - 145 mEq/L Final  . Potassium 05/31/2020 4.8  3.5 - 5.1 mEq/L Final  . Chloride 05/31/2020 102  96 - 112 mEq/L Final  . CO2 05/31/2020 27  19 - 32 mEq/L Final  . Glucose, Bld 05/31/2020 283* 70 - 99 mg/dL Final  . BUN 05/31/2020 24* 6 - 23 mg/dL Final  . Creatinine, Ser 05/31/2020 1.76* 0.40 - 1.50 mg/dL Final  . Total Bilirubin 05/31/2020 1.0  0.2 - 1.2 mg/dL Final  . Alkaline Phosphatase 05/31/2020 67  39 - 117 U/L Final  . AST 05/31/2020 14  0 - 37 U/L Final  . ALT 05/31/2020 22  0 - 53 U/L Final  . Total Protein 05/31/2020 6.3  6.0 - 8.3 g/dL Final  . Albumin 05/31/2020 4.1  3.5 - 5.2 g/dL Final  . GFR 05/31/2020 39.56* >60.00 mL/min Final  . Calcium 05/31/2020 10.0  8.4 - 10.5 mg/dL Final  . Hgb A1c MFr Bld 05/31/2020 8.6* 4.6 - 6.5 % Final   Glycemic Control Guidelines for People with Diabetes:Non Diabetic:  <6%Goal of Therapy: <7%Additional Action Suggested:  >8%     OTHER problems: See review of systems    Allergies as of 08/31/2020   No Known Allergies     Medication List       Accurate as of August 31, 2020  9:57 AM. If you have any questions, ask your nurse or doctor.        STOP taking these medications   Tresiba FlexTouch 100 UNIT/ML FlexTouch Pen Generic drug: insulin degludec Stopped by: Elayne Snare, MD     TAKE these medications   Accu-Chek FastClix Lancet Kit 1 each by Does not apply route daily.   Accu-Chek FastClix Lancets Misc Use lancets to check blood sugar 5 times daily.   amLODipine 10 MG tablet Commonly known as: NORVASC Take 1 tablet (10 mg total) by mouth daily.   aspirin EC 81 MG tablet Take 81 mg by mouth daily.   B-D UF III MINI PEN NEEDLES 31G X 5 MM Misc Generic drug: Insulin Pen  Needle USE 4 PER DAY TO INJECT INSULIN.   BD Hypodermic Needle 18G X 1" Misc Generic drug: NEEDLE (  DISP) 18 G Use 2 needles once weekly for Testosterone medication. Use 1 needle to draw up medication and 2'nd needle to inject into muscle.   DULoxetine 30 MG capsule Commonly known as: CYMBALTA Take 60 mg by mouth daily.   Fifty50 Glucose Meter 2.0 w/Device Kit Use as directed   Accu-Chek Guide w/Device Kit 1 each by Does not apply route daily. Use meter to check blood sugar 5 times daily.   fluocinonide cream 0.05 % Commonly known as: LIDEX Apply 1 application topically daily as needed (Skin inflammation (hand, thigh, back)).   furosemide 20 MG tablet Commonly known as: LASIX Take 40 mg by mouth daily as needed for edema.   gabapentin 100 MG capsule Commonly known as: NEURONTIN Take 200-300 mg by mouth See admin instructions. 200 mg in the morning and 300 mg at bedtime   glucose blood test strip Commonly known as: Accu-Chek Guide 1 each by Other route as needed for other. Use as instructed to check blood sugar 5 times daily.   HUMULIN R 500 UNIT/ML injection Generic drug: insulin regular human CONCENTRATED Use max 200 units in pump every three days   Insulin Syringe-Needle U-100 31G X 5/16" 0.3 ML Misc Commonly known as: BD Insulin Syringe U/F Use to inject insulin 2-3 times per day   BD Insulin Syringe U/F 31G X 5/16" 1 ML Misc Generic drug: Insulin Syringe-Needle U-100 USE 3 TIMES A DAY WITH HUMULIN R   lactulose 10 GM/15ML solution Commonly known as: CHRONULAC Take 10 g by mouth daily as needed.   levothyroxine 100 MCG tablet Commonly known as: SYNTHROID TAKE 1 TABLET BY MOUTH EVERY DAY   lisinopril 20 MG tablet Commonly known as: ZESTRIL Take 20 mg by mouth 2 (two) times a day. Take 1 tablet ($RemoveB'20mg'VwSrdowY$  total) by mouth twice daily.   metoprolol tartrate 50 MG tablet Commonly known as: LOPRESSOR TAKE 1 TABLET BY MOUTH TWICE A DAY   mirabegron ER 50 MG Tb24  tablet Commonly known as: MYRBETRIQ Take by mouth.   mycophenolate 250 MG capsule Commonly known as: CELLCEPT Take 1,000 mg by mouth every 12 (twelve) hours.   NovoLOG FlexPen 100 UNIT/ML FlexPen Generic drug: insulin aspart Inject 50 Units into the skin 3 (three) times daily with meals.   Nulojix 250 MG Solr injection Generic drug: belatacept Inject 24.5 mLs into the vein every 28 (twenty-eight) days.   predniSONE 5 MG tablet Commonly known as: DELTASONE Take 5 mg by mouth daily with breakfast.   rosuvastatin 10 MG tablet Commonly known as: CRESTOR Take 1 tablet (10 mg total) by mouth daily.   senna-docusate 8.6-50 MG tablet Commonly known as: Senokot-S Take 1 tablet by mouth as needed for mild constipation.   Syringe Luer Lock 3 ML Misc Use 1 syringe once weekly to inject Testosterone medication.   tadalafil 5 MG tablet Commonly known as: CIALIS Take by mouth.   tamsulosin 0.4 MG Caps capsule Commonly known as: FLOMAX Take 0.8 mg by mouth at bedtime.   testosterone cypionate 200 MG/ML injection Commonly known as: Depo-Testosterone Inject 0.5 mLs (100 mg total) into the muscle every 7 (seven) days.   traMADol 50 MG tablet Commonly known as: ULTRAM Take 1 tablet (50 mg total) by mouth every 6 (six) hours as needed. What changed:   how much to take  reasons to take this   traZODone 50 MG tablet Commonly known as: DESYREL Take 50-100 mg by mouth at bedtime as needed for sleep.   UNABLE TO  FIND BiPAP: At bedtime   Victoza 18 MG/3ML Sopn Generic drug: liraglutide INJECT 1.8 MG UNDER THE SKIN ONCE DAILY   zolpidem 10 MG tablet Commonly known as: AMBIEN Take 10-20 mg by mouth at bedtime as needed for sleep.       Allergies: No Known Allergies  Past Medical History:  Diagnosis Date  . Anemia    low hgb at present  . Arthritis    FEET  . Blood transfusion without reported diagnosis 1974   kidney removed - L  . Cancer ([place])    skin ca right  shoulder, plastic dsyplasia, pre-Ca polpys removed on Colonoscopy- 07/2014  . Colon polyps 07/23/2014   Tubular adenomas x 5  . Constipation   . Diabetes mellitus without complication (HCC)    Type 2  . Difficult intubation    unsure of actual problem but it was during the January 28, 2015 procedure.  Marland Kitchen Dysrhythmia   . Eczema   . ESRD (end stage renal disease) ([place])    hEMO mwf  . Headache(784.0)    slight dull h/a due kidney failure  . Hyperlipidemia   . Hypertension    SVT h/o , followed by Dr. Claiborne Billings  . Hypothyroidism   . Macular degeneration   . Neuropathy    right hand and both feet  . PAT (paroxysmal atrial tachycardia) ([place])   . PSVT (paroxysmal supraventricular tachycardia) ([place])   . SBO (small bowel obstruction) ([place])   . Shortness of breath    with exertion  . Sleep apnea 06/18/11   [facility] BiPAP- 13-16  . Thyroid disease   . Umbilical hernia    will repair with thi surgery    Past Surgical History:  Procedure Laterality Date  . AV FISTULA PLACEMENT Right 02/25/2015   Procedure: RIGHT ARTERIOVENOUS (AV) FISTULA CREATION;  Surgeon: Serafina Mitchell, MD;  Location: [place];  Service: Vascular;  Laterality: Right;  . AV FISTULA PLACEMENT Right 09/15/2015   Procedure: ARTERIOVENOUS (AV) FISTULA CREATION- RIGHT ARM;  Surgeon: Serafina Mitchell, MD;  Location: [place];  Service: Vascular;  Laterality: Right;  . CAPD INSERTION N/A 05/18/2014   Procedure: LAPAROSCOPIC INSERTION CONTINUOUS AMBULATORY PERITONEAL DIALYSIS  (CAPD) CATHETER;  Surgeon: Ralene Ok, MD;  Location: [place];  Service: General;  Laterality: N/A;  . COLONOSCOPY    . COLONOSCOPY WITH PROPOFOL N/A 07/12/2016   Procedure: COLONOSCOPY WITH PROPOFOL;  Surgeon: [name], MD;  Location: [facility];  Service: Endoscopy;  Laterality: N/A;  . declotting of fistula  08/2015  . ESOPHAGOGASTRODUODENOSCOPY (EGD) WITH PROPOFOL N/A 07/12/2016   Procedure:  ESOPHAGOGASTRODUODENOSCOPY (EGD) WITH PROPOFOL;  Surgeon: [name], MD;  [facility]: [facility];  Service: Endoscopy;  Laterality: N/A;  . FISTULA SUPERFICIALIZATION Right 02/07/2016   Procedure: RIIGHT UPPER ARM FISTULA SUPERFICIALIZATION;  Surgeon: Elam Dutch, MD;  Location: [place];  Service: Vascular;  Laterality: Right;  . IJ catheter insertion    . INSERTION OF DIALYSIS CATHETER Right 02/25/2015   Procedure: INSERTION OF RIGHT INTERNAL JUGULAR DIALYSIS CATHETER;  Surgeon: Serafina Mitchell, MD;  Location: [place];  Service: Vascular;  Laterality: Right;  . KIDNEY TRANSPLANT    . LAPAROSCOPIC REPOSITIONING CAPD CATHETER N/A 06/16/2014   Procedure: LAPAROSCOPIC REPOSITIONING CAPD CATHETER;  Surgeon: Ralene Ok, MD;  Location: [place];  Service: General;  Laterality: N/A;  . LAPAROSCOPY N/A 05/04/2015   Procedure: LAPAROSCOPY DIAGNOSTIC LYSIS OF ADHESIONS;  Surgeon: Coralie Keens, MD;  Location: [place] [facility];  Service: General;  Laterality: N/A;  . MINOR REMOVAL OF PERITONEAL DIALYSIS CATHETER N/A 04/28/2015   Procedure:  REMOVAL OF PERITONEAL DIALYSIS CATHETER;  Surgeon: Coralie Keens, MD;  Location: [place];  Service: General;  Laterality: N/A;  . NEPHRECTOMY Left 1974  . RENAL BIOPSY Right 2012  . REVISON OF ARTERIOVENOUS FISTULA Right 05/26/2015   Procedure: SUPERFICIALIZATION OF ARTERIOVENOUS FISTULA WITH SIDE BRANCH LIGATIONS;  Surgeon: Serafina Mitchell, MD;  Location: [place];  Service: Vascular;  Laterality: Right;  . SKIN CANCER EXCISION     right shoulder  . SVT ABLATION N/A 11/23/2016   Procedure: SVT Ablation;  Surgeon: Will Meredith Leeds, MD;  Location: [facility];  Service: Cardiovascular;  Laterality: N/A;  . UMBILICAL HERNIA REPAIR N/A 05/18/2014   Procedure: HERNIA REPAIR UMBILICAL ADULT;  Surgeon: Ralene Ok, MD;  Location: [place];  Service: General;  Laterality: N/A;  . UMBILICAL HERNIA REPAIR N/A 04/28/2015   Procedure: UMBILICAL HERNIA REPAIR WITH MESH;  Surgeon:  Coralie Keens, MD;  Location: [facility];  Service: General;  Laterality: N/A;    Family History  Problem Relation Age of Onset  . Colon polyps Father   . Colon cancer Neg Hx   . Stomach cancer Neg Hx     Social History:  reports that he quit smoking about 7 years ago. His smoking use included cigars. He has never used smokeless tobacco. He reports current alcohol use. He reports that he does not use drugs.  Review of Systems:  Following is a copy of the previous note:  Hypertension:    His blood pressure has been managed by his nephrologist and transplant team monitors BP at home also  BP Readings from Last 3 Encounters:  08/31/20 (!) 138/94  08/09/20 128/78  06/02/20 (!) 150/78    Lipids:  His LDL is below 100 with 20 mg Crestor, he thinks the dose was increased in 9/21 Has no evidence of vascular disease   Outside labs in 9/21:    Cholesterol, Total External 100 - 200 mg/dl 167      Triglyceride External 30 - 149 mg/dl 129      HDL Total External 40 - 60 mg/dl 56      LDL-Calc External mg/dl 85        Lab Results  Component Value Date   CHOL 158 07/07/2019   CHOL 153 04/29/2019   CHOL 166 07/10/2018   Lab Results  Component Value Date   HDL 47 07/07/2019   HDL 54.30 04/29/2019   HDL 54.20 07/10/2018   Lab Results  Component Value Date   LDLCALC 92 07/07/2019   [place] 80 04/29/2019   LDLCALC 95 07/10/2018   Lab Results  Component Value Date   TRIG 93 07/07/2019   TRIG 92.0 04/29/2019   TRIG 86.0 07/10/2018   Lab Results  Component Value Date   CHOLHDL 3 04/29/2019   CHOLHDL 3 07/10/2018   CHOLHDL 3 05/28/2017   No results found for: LDLDIRECT   HYPOTHYROIDISM: He has had long-standing hypothyroidism  Has been taking his 100 g levothyroxine before breakfast daily  TSH normal  Lab Results  Component Value Date   TSH 1.33 02/12/2020       HYPOGONADISM: He has had hypogonadotropic hypogonadism related to his metabolic  syndrome Subjectively improved with supplementation He has had various transdermal preparations in the past  When testosterone level was low he was having fatigue and decreased motivation and because of his  he was prescribed He has been using AndroGel since February 2019 but subsequently stopped this because of cost  Current regimen is Depo testosterone 100 mg weekly taking injections on Saturday  Testosterone level is  persistently low Pretreatment free testosterone level low at 3.7 He has had fatigue and depression, not recently much better consistently with taking testosterone injections No recent labs available    Lab Results  Component Value Date   TESTOSTERONE 181.28 (L) 05/31/2020   TESTOSTERONE 243.23 (L) 03/31/2020   TESTOSTERONE 241.91 (L) 02/12/2020    He has depression, taking 30 mg of Cymbalta; followed by PCP  Diabetic neuropathy: Has mild sensory loss on his last exam    Examination:   BP (!) 138/94   Pulse 74   Ht $R'5\' 10"'il$  (1.778 m)   Wt 294 lb 6.4 oz (133.5 kg)   SpO2 96%   BMI 42.24 kg/m   Body mass index is 42.24 kg/m.    ASSESSMENT/ PLAN:    Diabetes type 2 with obesity, insulin-requiring:   See history of present illness for detailed discussion of his current management, problems identified and sugar patterns on his meter download  Has been on basal insulin on his OmniPod pump with U-500, Victoza and mealtime insulin using NovoLog FlexPen currently  A1c is most recently 7.9  He is having variable blood sugars with no consistent pattern currently Also difficult to assess pre and postprandial blood sugars because of not using the pump for bolusing Blood sugars may be higher with stopping Victoza and he has gained weight Also compliance with diet has been worse because of depression Not able to exercise He is still not comfortable programming his insulin pump  Still cannot afford the freestyle libre or any other CGM  system  Recommendations:  Basal insulin 0.8 between 5 AM-12 noon to help with his midday hyperglycemia  He needs to consistently do mealtime insulin before starting to eat based on his carbohydrate count and premeal blood sugar, he can use the pump to calculate his dose  Resume exercise when able to  He agrees to start Victoza 0.6 mg daily for at least 1 week and if adjusted he can go up to 1.2 mg in about 1 to 2 weeks; otherwise if he has constipation he can try Trulicity likely  J6R on the next visit  Recheck lipids, thyroid levels and testosterone He needs to discuss his depression with his PCP  Patient Instructions  0.8 at 5a to 12 noon      Elayne Snare 08/31/2020, 9:57 AM   Addendum: Testosterone level is 267, needs to go up to 120 mg weekly instead of 100 mg Also LDL improved

## 2020-08-31 NOTE — Patient Instructions (Addendum)
Basal 0.8 at 5a to 12 noon  Victoza 0.6 for 1 week then 1.2 weekly

## 2020-09-05 ENCOUNTER — Telehealth: Payer: Self-pay | Admitting: Podiatry

## 2020-09-05 NOTE — Telephone Encounter (Signed)
Pt called on 11.26 @ 307pm asking for a call back to let him know if we received the paperwork back from his endocrinologist for his diabetic shoes.  I returned call and left message for pt that we have received the paperwork and we are waiting on inserts to come in and I will call pt when they come in.

## 2020-09-09 ENCOUNTER — Telehealth: Payer: Self-pay | Admitting: Cardiovascular Disease

## 2020-09-09 NOTE — Telephone Encounter (Signed)
Patient has a sleep study scheduled on 12/08 with Dr. Claiborne Billings. He would like to know what to bring with him and if there are any medications he should not take prior too. He would like to know should he bring his own bipap mask and tubing. Please call/advise.

## 2020-09-09 NOTE — Telephone Encounter (Signed)
Spoke with patient. System is showing patient has an office visit with Dr. Claiborne Billings on 12/08 at 4:40pm. Patient confused as he was told it was a sleep study with Dr. Claiborne Billings and he should bring equipment. Patient would like clarification. Will send to sleep nurse to verify 09/14/20 is an office visit and not a sleep study. Patient would like a call back, if no answer please let voicemail.

## 2020-09-12 NOTE — Telephone Encounter (Signed)
Spoke with the pt to clarify that his visit this Wednesday, December 8 at 4:40 pm is with Dr. Claiborne Billings for follow up, and not a sleep study. Assured the patient that any additional testing or adjustments to sleep equipment prescription could be made at Dr. Evette Georges discretion at that visit. The patient verbalizes understanding and agreement with plan.

## 2020-09-14 ENCOUNTER — Other Ambulatory Visit: Payer: Self-pay

## 2020-09-14 ENCOUNTER — Ambulatory Visit (INDEPENDENT_AMBULATORY_CARE_PROVIDER_SITE_OTHER): Payer: BC Managed Care – PPO | Admitting: Cardiovascular Disease

## 2020-09-14 ENCOUNTER — Encounter: Payer: Self-pay | Admitting: Cardiovascular Disease

## 2020-09-14 DIAGNOSIS — E785 Hyperlipidemia, unspecified: Secondary | ICD-10-CM

## 2020-09-14 DIAGNOSIS — E039 Hypothyroidism, unspecified: Secondary | ICD-10-CM

## 2020-09-14 DIAGNOSIS — Z9989 Dependence on other enabling machines and devices: Secondary | ICD-10-CM | POA: Diagnosis not present

## 2020-09-14 DIAGNOSIS — G4733 Obstructive sleep apnea (adult) (pediatric): Secondary | ICD-10-CM

## 2020-09-14 DIAGNOSIS — Z794 Long term (current) use of insulin: Secondary | ICD-10-CM

## 2020-09-14 DIAGNOSIS — E118 Type 2 diabetes mellitus with unspecified complications: Secondary | ICD-10-CM

## 2020-09-14 DIAGNOSIS — Z940 Kidney transplant status: Secondary | ICD-10-CM

## 2020-09-14 DIAGNOSIS — I350 Nonrheumatic aortic (valve) stenosis: Secondary | ICD-10-CM | POA: Diagnosis not present

## 2020-09-14 DIAGNOSIS — I10 Essential (primary) hypertension: Secondary | ICD-10-CM | POA: Diagnosis not present

## 2020-09-14 NOTE — Progress Notes (Signed)
Patient ID: Robert [name], male   DOB: 15-May-1961, 59 y.o.   MRN: [id number]     HPI: Robert [name], is a 59 y.o. male who is a former patient of Dr. Terance Ice. He presents for a 6 month follow-up evaluation.  Robert [name]  has a complex medical history that includes a congenital nonfunctioning left kidney for which he underwent left nephrectomy at age 32. He has a history of obesity, diabetes mellitus, and has been demonstrated to have proteinuria of his remaining single kidney, being status post open renal biopsy in [name] in November 2010. This revealed focal segmental glomerular sclerosis. At that time he was seen by Dr. Venita Sheffield in the nephrology department at [facility]. He sees Dr. Moshe Cipro in [place] for his kidney insufficiency and has been on ARB therapy in the past in an attempt to reduce proteinuria. He sees Dr. Dwyane Dee for his diabetes mellitus. He also has a history of hypothyroidism, hyperlipidemia, anxiety and depression. He has a history of prior supraventricular tachycardia requiring hospitalization in 2007 as was felt to be possibly AV nodal reentrant rhythm.  He has a history of complex sleep apnea initially diagnosed in 2007 with an AHI at that time of 56.7 per hour and during REM sleep this increased to 65.7 per hour. At that time he dropped his oxygen 84% and had loud snoring. He had been on BiPAP therapy. He underwent a 5 year followup split-night study in 2012 which again confirmed severe sleep apnea with an AHI of 60.7 on the baseline portion of the study. He was unable to achieve REM sleep at that time. Oxygen saturation during non-REM sleep at 83%. He was titrated utilizing BiPAP up to a setting of 16/12 cm water pressure. He does admit to continued residual daytime sleepiness despite using his BiPAP therapy.  An echo Doppler study in August 2012 showed mild concentric LVH with normal systolic function and grade 1  diastolic dysfunction. He had trace mitral and tricuspid insufficiency as well as pulmonic insufficiency. A nuclear perfusion study in August 2012 showed normal perfusion.  Robert [name] was on dialysis Monday, [name].  He is on the [facility] transplant list.  In January 2017 he had an stress echo at [facility] as part of his transplant evaluation.  I do not have the results of this evaluation.  He has had some difficulty with a steal syndrome in his right hand since he had AV fistulas inserted in the forearm and most recently now in the upper arm.  He continues to use BiPAP with 100% compliance.  He had a [name].  He obtains his CPAP supplies online because they are cheaper, but had seen [facility].  He is using a Art therapist full face mask.  A download in the office from 05/28/2016 through 07/26/2016.  He is meeting compliance standards with usage at 85% usage stays greater than 4 hours at 77%.  He is averaging 6 hours and 43 minutes per night.  He is set at an IPAP pressure of 16 and EPAP pressure of 12.  His AHI was mildly increased at 6.5 with 5.2 per hour of apneic events and 1.3 of hypopnea events.  When I last saw him he had stopped taken Bystolic since his blood pressure had become low at the end of dialysis.  He had been on 5 mg on nondialysis days.  He tells me his aspirin was discontinued because he was  continuously having issues with bleeding following his dialysis when the catheter was removed.   He had an episode of SVT in December 2017, which was successfully aborted with vagal maneuvers.  In the emergency room, he had 2-3 more recurrences of SVT, of which, again, all aborted with vagal maneuvers.  He was started on verapamil and discharge from the hospital.  He has seen Dr. Camnitz and had an EP study with attempted ablation, but apparently he was not able to be induced and ablation was not performed.   He denies any recurrent episodes of SVT.  Remotely, he  may of had some mild nonexertional musculoskeletal discomfort but has not experienced any of this recently.  Reportedly, his remote stress echo was negative for ischemia, although mild ST changes were noted in recovery.  He continues to be asymptomatic.  He underwent a kidney transplant from a living donor on 02/25/2017 at [facility].  He tolerated surgery well but has developed a postoperative inguinal hernia, which will ultimately need to be surgically repaired.  He received a new BiPAP machine from [facility] 2 weeks ago.  He has a full face mask, but admits to increased leak.    Since I saw him in September 2018, he had 2 hospital stays at [facility].  He developed intestinal bleeding due to diverticular disease and also had some dehydration issues.  He also underwent inguinal hernia surgery.  He denies any recent chest pain or palpitations.  He continues to use BiPAP with 100% compliance.  However, he did not have his machine with him for the days he was in the hospital.  He brought his machine with him today for interrogation.  Over the past month, usage hours and 6 hours and 32 minutes per night with an HI of 0.9.  He used to machine 27 of 30 nights nights that were not used was because he was in the hospital.  Usage greater than 4 hours was 26 of 30.  He is on a BiPAP pressure of 18/13 0.9.  Recently he has noticed a mild leak.  AHI was 5.6 over the past 7 days.  He has a ResMed fullface mask, Mirage Quatro mask.    Since I last saw him in November 2018, he has felt well.  He is seeing Dr. Matthew Ellis at [facility] from a nephrology standpoint.  He denies any episodes of chest pain or awareness of recurrent arrhythmia.  He admits to 100% use with CPAP.  [employer] is his DME company.  He has a ResMed air 10 V auto unit with an EPAP pressure 14.  An IPAP pressure set at 18.  A download from 01/14/2017 through 02/12/2018 shows 100% compliance.  He is averaging 8 hours and 7 minutes of sleep.  AHI is excellent at  2.6.    I saw him in May 2019 and since that time, he has continued to follow at [facility]. He was last evaluated by me in a telemedicine visit in April 2020.  At that time he had noticed that over the past year  his blood pressure had increased and his lisinopril dose was increased to 20 mg and amlodipine reduced to 5 mg.  His weight had fluctuated and has increased from 293 to 303.  He has continued to use BiPAP with 100% compliance.  I was able to obtain a download today from March 23 through January 27, 2019.  He is 100% compliant with average BiPAP use of 7 hours and 10 minutes.    At his 18/14 pressure, AHI remains excellent at 2.6/h.  Over the past 6 months he has changed jobs twice.  He is been staying home particularly with his significant comorbidities which can be affected by the coronavirus including hypertension, diabetes mellitus, obesity, renal disease, and immunosuppression.  He walks approximately 2 times a day for short duration.  He receives immunosuppressant infusion with [employer] and is scheduled for his next infusion: On Feb 12, 2019.  His last lipid panel was in October 2019 with total cholesterol 137 and LDL cholesterol 73.  He is scheduled to see his nephrologist in August.    I last saw him in the office in February 2021 and since his  telemedicine evaluation in April 2020, he continued to be followed closely at [facility]. Creatinine had increased to 1.74. He has noticed blood pressure elevation. He has not been successful with weight loss. He has been without anginal symptoms or awareness of recurrent SVT. He has continued to use BiPAP therapy. A download from 10/22/2019 through 11/20/1999 shows 93% of usage days. Average use is 8 hours and 10 minutes. At his pressure of 18/14, AHI is excellent at 1.5. There is no mask leak.  During his February 2021 evaluation he had a 2/6 murmur consistent with aortic stenosis and I recommended follow-up echo Doppler evaluation.  He underwent an echo  Doppler study on December 08, 2019 which showed an EF of 65 to 70%, moderate left ventricular hypertrophy and grade 1 diastolic dysfunction.  There was mild mitral valve regurgitation.  There was moderate to severe aortic stenosis with a mean gradient of 39 peak gradient of 58.  He was evaluated in a telemedicine visit in June 2021.  At that time he denied any chest pain, presyncope or syncope.  He continues to go to [facility] for nephrology follow-up.  He states his blood pressure is stable every morning upon awakening but as a day progresses it may go up slightly.  He denies any exertional shortness of breath.  At times he has noticed some mild lower extremity edema.  Since I last saw him he was evaluated at [facility] in July 2021 and was hospitalized overnight with right lower quadrant pain around his renal allograft.  A CT scan demonstrated normal-appearing transplant kidney without any acute abdominal/pelvic abnormalities however, significant stool burden was noted on CT suggesting moderate to severe constipation.  Presently, he denies any chest pain.  He has noticed some mild shortness of breath over the past several weeks particularly with walking up steps.  He is now on insulin pump.  He has noticed some lower extremity edema.  He continues to use CPAP.  A download was obtained in the office today from November 7 through September 12, 2020 which shows excellent compliance.  There were only 2 days of nonuse when he never went to bed and it stayed in a recliner.  He is on BiPAP with a pressure of 18/14.  He is averaging 8 hours and 54 minutes of BiPAP use per night.  AHI is excellent at 2.0.  He does have mask leak and has been noticing a leak in the region of his chin.  He presents for follow-up evaluation.   Past Medical History:  Diagnosis Date  . Anemia    low hgb at present  . Arthritis    FEET  . Blood transfusion without reported diagnosis 1974   kidney removed - L  . Cancer (HCC)    skin ca right  shoulder, plastic   dsyplasia, pre-Ca polpys removed on Colonoscopy- 07/2014  . Colon polyps 07/23/2014   Tubular adenomas x 5  . Constipation   . Diabetes mellitus without complication (HCC)    Type 2  . Difficult intubation    unsure of actual problem but it was during the January 28, 2015 procedure.  . Dysrhythmia   . Eczema   . ESRD (end stage renal disease) (HCC)    hEMO mwf  . Headache(784.0)    slight dull h/a due kidney failure  . Hyperlipidemia   . Hypertension    SVT h/o , followed by Dr.   . Hypothyroidism   . Macular degeneration   . Neuropathy    right hand and both feet  . PAT (paroxysmal atrial tachycardia) (HCC)   . PSVT (paroxysmal supraventricular tachycardia) (HCC)   . SBO (small bowel obstruction) (HCC)   . Shortness of breath    with exertion  . Sleep apnea 06/18/11   split-night sleep study- [facility]., BiPAP- 13-16  . Thyroid disease   . Umbilical hernia    will repair with thi surgery    Past Surgical History:  Procedure Laterality Date  . AV FISTULA PLACEMENT Right 02/25/2015   Procedure: RIGHT ARTERIOVENOUS (AV) FISTULA CREATION;  Surgeon: Vance W Brabham, MD;  Location: [facility] OR;  Service: Vascular;  Laterality: Right;  . AV FISTULA PLACEMENT Right 09/15/2015   Procedure: ARTERIOVENOUS (AV) FISTULA CREATION- RIGHT ARM;  Surgeon: Vance W Brabham, MD;  Location: [facility] OR;  Service: Vascular;  Laterality: Right;  . CAPD INSERTION N/A 05/18/2014   Procedure: LAPAROSCOPIC INSERTION CONTINUOUS AMBULATORY PERITONEAL DIALYSIS  (CAPD) CATHETER;  Surgeon: Armando Ramirez, MD;  Location: [facility] OR;  Service: General;  Laterality: N/A;  . COLONOSCOPY    . COLONOSCOPY WITH PROPOFOL N/A 07/12/2016   Procedure: COLONOSCOPY WITH PROPOFOL;  Surgeon: Daniel P Jacobs, MD;  Location: [facility] END[facility];  Service: Endoscopy;  Laterality: N/A;  . declotting of fistula  08/2015  . ESOPHAGOGASTRODUODENOSCOPY (EGD) WITH PROPOFOL N/A 07/12/2016   Procedure:  ESOPHAGOGASTRODUODENOSCOPY (EGD) WITH PROPOFOL;  Surgeon: Daniel P Jacobs, MD;  Location: [facility];  Service: Endoscopy;  Laterality: N/A;  . FISTULA SUPERFICIALIZATION Right 02/07/2016   Procedure: RIIGHT UPPER ARM FISTULA SUPERFICIALIZATION;  Surgeon: Charles E Fields, MD;  Location: [facility];  Service: Vascular;  Laterality: Right;  . IJ catheter insertion    . INSERTION OF DIALYSIS CATHETER Right 02/25/2015   Procedure: INSERTION OF RIGHT INTERNAL JUGULAR DIALYSIS CATHETER;  Surgeon: Vance W Brabham, MD;  Location: [facility];  Service: Vascular;  Laterality: Right;  . KIDNEY TRANSPLANT    . LAPAROSCOPIC REPOSITIONING CAPD CATHETER N/A 06/16/2014   Procedure: LAPAROSCOPIC REPOSITIONING CAPD CATHETER;  Surgeon: Armando Ramirez, MD;  Location: [facility];  Service: General;  Laterality: N/A;  . LAPAROSCOPY N/A 05/04/2015   Procedure: LAPAROSCOPY DIAGNOSTIC LYSIS OF ADHESIONS;  Surgeon: Douglas Blackman, MD;  Location: [facility];  Service: General;  Laterality: N/A;  . MINOR REMOVAL OF PERITONEAL DIALYSIS CATHETER N/A 04/28/2015   Procedure:  REMOVAL OF PERITONEAL DIALYSIS CATHETER;  Surgeon: Douglas Blackman, MD;  Location: [facility];  Service: General;  Laterality: N/A;  . NEPHRECTOMY Left 1974  . RENAL BIOPSY Right 2012  . REVISON OF ARTERIOVENOUS FISTULA Right 05/26/2015   Procedure: SUPERFICIALIZATION OF ARTERIOVENOUS FISTULA WITH SIDE BRANCH LIGATIONS;  Surgeon: Vance W Brabham, MD;  Location: [facility];  Service: Vascular;  Laterality: Right;  . SKIN CANCER EXCISION     right shoulder  . SVT   ABLATION N/A 11/23/2016   Procedure: SVT Ablation;  Surgeon: Will Martin Camnitz, MD;  [facility]: [facility];  Service: Cardiovascular;  Laterality: N/A;  . UMBILICAL HERNIA REPAIR N/A 05/18/2014   Procedure: HERNIA REPAIR UMBILICAL ADULT;  Surgeon: Armando Ramirez, MD;  Location: [facility];  Service: General;  Laterality: N/A;  . UMBILICAL HERNIA REPAIR N/A 04/28/2015   Procedure: UMBILICAL HERNIA REPAIR WITH MESH;  Surgeon:  Douglas Blackman, MD;  Location: [facility];  Service: General;  Laterality: N/A;    Left nephrectomy in 1974.  No Known Allergies  Current Outpatient Medications  Medication Sig Dispense Refill  . ACCU-CHEK FASTCLIX LANCETS MISC Use lancets to check blood sugar 5 times daily. 500 each 3  . amLODipine (NORVASC) 10 MG tablet Take 1 tablet (10 mg total) by mouth daily. 90 tablet 3  . amoxicillin (AMOXIL) 500 MG tablet Take 1,000 mg by mouth 2 (two) times daily. Before dental visits    . aspirin EC 81 MG tablet Take 81 mg by mouth daily.    . B-D UF III MINI PEN NEEDLES 31G X 5 MM MISC USE 4 PER DAY TO INJECT INSULIN. 100 each 0  . BD INSULIN SYRINGE U/F 31G X 5/16" 1 ML MISC USE 3 TIMES A DAY WITH HUMULIN R 90 each 2  . belatacept (NULOJIX) 250 MG SOLR injection Inject 24.5 mLs into the vein every 28 (twenty-eight) days.     . Blood Glucose Monitoring Suppl (ACCU-CHEK GUIDE) w/Device KIT 1 each by Does not apply route daily. Use meter to check blood sugar 5 times daily. 1 kit 0  . Blood Glucose Monitoring Suppl (FIFTY50 GLUCOSE METER 2.0) w/Device KIT Use as directed    . DULoxetine (CYMBALTA) 60 MG capsule Take 60 mg by mouth daily.    . fluocinonide cream (LIDEX) 0.05 % Apply 1 application topically daily as needed (Skin inflammation (hand, thigh, back)).     . furosemide (LASIX) 20 MG tablet Take 20 mg by mouth daily.    . gabapentin (NEURONTIN) 100 MG capsule Take 200-300 mg by mouth See admin instructions. 200 mg in the morning and 300 mg at bedtime    . glucose blood (ACCU-CHEK GUIDE) test strip 1 each by Other route as needed for other. Use as instructed to check blood sugar 5 times daily. 500 each 3  . HUMULIN R 500 UNIT/ML injection Use max 200 units in pump every three days 60 mL 1  . insulin aspart (NOVOLOG FLEXPEN) 100 UNIT/ML FlexPen Inject 50 Units into the skin 3 (three) times daily with meals. 135 mL 1  . Insulin Syringe-Needle U-100 (BD INSULIN SYRINGE ULTRAFINE) 31G X 5/16" 0.3 ML  MISC Use to inject insulin 2-3 times per day 300 each 1  . lactulose (CHRONULAC) 10 GM/15ML solution Take 10 g by mouth daily as needed.     . Lancets Misc. (ACCU-CHEK FASTCLIX LANCET) KIT 1 each by Does not apply route daily. 1 kit 0  . levothyroxine (SYNTHROID) 100 MCG tablet TAKE 1 TABLET BY MOUTH EVERY DAY 90 tablet 0  . lisinopril (ZESTRIL) 20 MG tablet Take 20 mg by mouth 2 (two) times a day. Take 1 tablet (20mg total) by mouth twice daily.    . metoprolol tartrate (LOPRESSOR) 50 MG tablet TAKE 1 TABLET BY MOUTH TWICE A DAY 180 tablet 3  . mycophenolate (CELLCEPT) 250 MG capsule Take 1,000 mg by mouth every 12 (twelve) hours.     . NEEDLE, DISP, 18 G (BD HYPODERMIC   NEEDLE) 18G X 1" MISC Use 2 needles once weekly for Testosterone medication. Use 1 needle to draw up medication and 2'nd needle to inject into muscle. 200 each 1  . predniSONE (DELTASONE) 5 MG tablet Take 5 mg by mouth daily with breakfast.    . rosuvastatin (CRESTOR) 20 MG tablet Take 20 mg by mouth at bedtime.    . senna-docusate (SENOKOT-S) 8.6-50 MG tablet Take 1 tablet by mouth as needed for mild constipation.    . Syringe, Disposable, (SYRINGE LUER LOCK) 3 ML MISC Use 1 syringe once weekly to inject Testosterone medication. 100 each 1  . tamsulosin (FLOMAX) 0.4 MG CAPS capsule Take 0.8 mg by mouth at bedtime.     Marland Kitchen testosterone cypionate (DEPO-TESTOSTERONE) 200 MG/ML injection Inject 0.5 mLs (100 mg total) into the muscle every 7 (seven) days. 10 mL 1  . traMADol (ULTRAM) 50 MG tablet Take 1 tablet (50 mg total) by mouth every 6 (six) hours as needed. 15 tablet 0  . traZODone (DESYREL) 50 MG tablet Take 50-100 mg by mouth at bedtime as needed for sleep.     Marland Kitchen UNABLE TO FIND BiPAP: At bedtime    . zolpidem (AMBIEN) 10 MG tablet Take 10-20 mg by mouth at bedtime as needed for sleep.      No current facility-administered medications for this visit.   Social history is notable in that he is married for 29 years. His 3  children. He has a degree as a chemistry. 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 He does drink occasional alcohol. He has smoked cigars.  ROS General: Negative; No fevers, chills, or night sweats; positive for morbid obesity HEENT: Negative; No changes in vision or hearing, sinus congestion, difficulty swallowing Pulmonary: Negative; No cough, wheezing, shortness of breath, hemoptysis Cardiovascular: No chest pain, PND, orthopnea, or recurrent SVT. GI: Negative; No nausea, vomiting, diarrhea, or abdominal pain GU: Negative; No dysuria, hematuria, or difficulty voiding Renal: no longer on dialysis, status post renal transplant Musculoskeletal: Negative; no myalgias, joint pain, or weakness Hematologic/Oncology: Negative; no easy bruising, bleeding Endocrine: Positive for diabetes and hypothyroidism Neuro: Negative; no changes in balance, headaches Skin: Negative; No rashes or skin lesions Psychiatric: Negative; No behavioral problems, depression Sleep: Positive for OSA on BiPAP; no daytime sleepiness, hypersomnolence, bruxism, restless legs, hypnogognic hallucinations, no cataplexy Other comprehensive 14 point system review is negative.  PE BP (!) 160/88   Pulse 73   Ht 5' 11" (1.803 m)   Wt 296 lb (134.3 kg)   BMI 41.28 kg/m    Left arm blood pressure 128/78;  he has a right upper arm fistula.  Wt Readings from Last 3 Encounters:  09/14/20 296 lb (134.3 kg)  08/31/20 294 lb 6.4 oz (133.5 kg)  08/09/20 288 lb 12.8 oz (131 kg)   General: Alert, oriented, no distress.  Skin: normal turgor, no rashes, warm and dry HEENT: Normocephalic, atraumatic. Pupils equal round and reactive to light; sclera anicteric; extraocular muscles intact;  Nose without nasal septal hypertrophy Mouth/Parynx benign; Mallinpatti scale 3 Neck: Thick neck; no JVD, no carotid bruits; normal carotid upstroke Lungs: clear to ausculatation and percussion; no wheezing or rales Chest wall: without tenderness  to palpitation Heart: PMI not displaced, RRR, s1 s2 normal, 2/6 systolic murmur consistent with aortic stenosis, no diastolic murmur, no rubs, gallops, thrills, or heaves Abdomen: soft, nontender; no hepatosplenomehaly, BS+; abdominal aorta nontender and not dilated by palpation. Back: no CVA tenderness Pulses 2+ Musculoskeletal: full range of motion,  normal strength, no joint deformities Extremities: no clubbing cyanosis or edema, Homan's sign negative  Neurologic: grossly nonfocal; Cranial nerves grossly wnl Psychologic: Normal mood and affect   ECG (independently read by me): Normal sinus rhythm at 73 bpm.  Poor anterior R wave progression V1 through V3.  Mild ST changes laterally.  Normal intervals.  No ectopy.  November 20, 2019 ECG (independently read by me): Normal sinus rhythm at 68 bpm.  Q-wave lead III.  Mild inferior T wave abnormality.  Normal intervals.  QTc interval 440 ms.  No ectopy  May 2019 ECG (independently read by me): NSR at 64; normal intervals.  No ectopy  November 2018 ECG (independently read by me): normal sinus rhythm at 67 bpm.  Normal intervals.  No ectopy.  September 2018 ECG (independently read by me): normal sinus rhythm at 71 bpm.  No ST segment changes.  Normal intervals.  May 2018 ECG (independently read by me): Normal sinus rhythm at 89 bpm.  No STT changes .  PR interval 126 ms.  QTc interval 457 ms. Normal ECG.  October 2017 ECG (independently read by me): Sinus tachycardia 101 bpm.  April 2017 ECG (independently read by me): Sinus tachycardia 100 bpm.  2.  TC interval 45 ms.  November 2014 ECG: Normal sinus 34 beats per minute. PR interval 138 ms. QT interval 448 ms.  LABS: BMP Latest Ref Rng & Units 08/31/2020 05/31/2020 03/31/2020  Glucose 70 - 99 mg/dL 103(H) 283(H) 135(H)  BUN 6 - 23 mg/dL 28(H) 24(H) -  Creatinine 0.40 - 1.50 mg/dL 1.73(H) 1.76(H) -  BUN/Creat Ratio 9 - 20 - - -  Sodium 135 - 145 mEq/L 143 136 -  Potassium 3.5 - 5.1 mEq/L  4.0 4.8 -  Chloride 96 - 112 mEq/L 105 102 -  CO2 19 - 32 mEq/L 28 27 -  Calcium 8.4 - 10.5 mg/dL 10.4 10.0 -   Hepatic Function Latest Ref Rng & Units 05/31/2020 03/03/2020 02/12/2020  Total Protein 6.0 - 8.3 g/dL 6.3 5.9(L) 6.5  Albumin 3.5 - 5.2 g/dL 4.1 3.8 4.4  AST 0 - 37 U/L 14 21 15  ALT 0 - 53 U/L 22 23 20  Alk Phosphatase 39 - 117 U/L 67 73 87  Total Bilirubin 0.2 - 1.2 mg/dL 1.0 0.9 0.8    CBC Latest Ref Rng & Units 05/31/2020 03/03/2020 02/12/2020  WBC 4.0 - 10.5 K/uL 8.8 9.6 7.0  Hemoglobin 13.0 - 17.0 g/dL 13.8 14.2 14.0  Hematocrit 39.0 - 52.0 % 41.6 44.4 42.2  Platelets 150.0 - 400.0 K/uL 267.0 216 244.0    Lab Results  Component Value Date   MCV 85.1 05/31/2020   MCV 86.4 03/03/2020   MCV 85.1 02/12/2020   Lab Results  Component Value Date   TSH 1.25 08/31/2020   Lab Results  Component Value Date   HGBA1C 8.6 (H) 05/31/2020   Lipid Panel     Component Value Date/Time   CHOL 147 08/31/2020 1013   TRIG 97.0 08/31/2020 1013   HDL 53.40 08/31/2020 1013   CHOLHDL 3 08/31/2020 1013   VLDL 19.4 08/31/2020 1013   LDLCALC 75 08/31/2020 1013    RADIOLOGY: No results found.  IMPRESSION:  1. Aortic valve stenosis, etiology of cardiac valve disease unspecified   2. Essential hypertension   3. OSA on BiPAP   4. Hyperlipidemia with target LDL less than 70   5. Type 2 diabetes mellitus with complication, with long-term current use of insulin (HCC)     6. Hypothyroidism, unspecified type   7. Morbid obesity (HCC)   8. History of renal transplantation     ASSESSMENT AND PLAN: Mr. Harms is a 59-year-old Caucasian male who has a history of morbid obesity, hypertension, diabetes mellitus, hyperlipidemia, and end-stage kidney disease.  He is status post remote nephrectomy in1974 and on 02/25/2017 after having been on hemodialysis he underwent successful renal transplantation from a living donor done at [facility].  He developed a postoperative  inguinal hernia, which subsequently was repaired.  There also was a history of possible diverticular bleeding.  He has a history of SVT, but he has not had any recurrence.  He is followed closely by the transplantation team at [facility].  I reviewed recent [facility] records.  On exam he has a murmur of aortic stenosis.  An echo Doppler study in March 2021 showed an EF at 65 to 70%, grade 1 diastolic dysfunction.  He had moderate to severe aortic valve stenosis with a mean gradient of 39 and peak gradient of 58.7.  Aortic valve area is 1.18 cm.  His blood pressure today on repeat by me was excellent at 128/78.  He continues to be on his regimen of amlodipine 10 mg, furosemide as needed for swelling, lisinopril 20 mg twice a day, metoprolol tartrate 50 mg twice a day.  He is on rosuvastatin 20 mg daily for hyperlipidemia.  LDL cholesterol on November 24 was 75.  He is diabetic on insulin.  He continues to be on levothyroxine for hypothyroidism.  He is now on Nulojix in addition to CellCept for his immunosuppressive therapy.  He does have trace lower extremity edema and I have recommended support stockings.  I have recommended for the next 3 days he takes Lasix 20 mg daily and then every other day if swelling persists.  I have recommended a follow-up echo Doppler study be obtained in 3 months for 1 year follow-up evaluation and I plan to see him following his echo assessment.  He continues to use BiPAP therapy with excellent compliance.  I have suggested changing his mask to a ResMed air fit F 30i mask which I believe he will tolerate much better and should have significantly less leak.  BMI is increased at 41.28.  Weight loss was recommended.  I will see him in 3 months for follow-up evaluation.   A. , MD, FACC  09/17/2020 5:01 PM 

## 2020-09-14 NOTE — Patient Instructions (Signed)
Medication Instructions:  Start taking your furosemide (Lasix) 20 mg once a day *If you need a refill on your cardiac medications before your next appointment, please call your pharmacy*   Lab Work: None ordered If you have labs (blood work) drawn today and your tests are completely normal, you will receive your results only by: Marland Kitchen MyChart Message (if you have MyChart) OR . A paper copy in the mail If you have any lab test that is abnormal or we need to change your treatment, we will call you to review the results.   Testing/Procedures: Your physician has requested that you have an echocardiogram in February or March 2022. Echocardiography is a painless test that uses sound waves to create images of your heart. It provides your doctor with information about the size and shape of your heart and how well your heart's chambers and valves are working. This procedure takes approximately one hour. There are no restrictions for this procedure. This test is performed at [address] scheduler will call you tomorrow to make this appointment.      Follow-Up: At [facility], you and your health needs are our priority.  As part of our continuing mission to provide you with exceptional heart care, we have created designated Provider Care Teams.  These Care Teams include your primary Cardiologist (physician) and Advanced Practice Providers (APPs -  Physician Assistants and Nurse Practitioners) who all work together to provide you with the care you need, when you need it.  We recommend signing up for the patient portal called "MyChart".  Sign up information is provided on this After Visit Summary.  MyChart is used to connect with patients for Virtual Visits (Telemedicine).  Patients are able to view lab/test results, encounter notes, upcoming appointments, etc.  Non-urgent messages can be sent to your provider as well.   To learn more about what you can do with MyChart, go to  [email or web address].    Your next appointment:   3 month(s)  The format for your next appointment:   In Person  Provider:   Shelva Majestic, MD   Other Instructions None

## 2020-09-15 ENCOUNTER — Telehealth: Payer: Self-pay | Admitting: Cardiovascular Disease

## 2020-09-15 NOTE — Telephone Encounter (Signed)
Spoke with patient regarding appointment for Echo scheduled 11/16/20 at 10:15 am at [address], [address]---follow up with Dr. Claiborne Billings scheduled 12/01/20 at 320pm.---Will mail information to patient and he voiced his understanding.

## 2020-09-17 ENCOUNTER — Encounter: Payer: Self-pay | Admitting: Cardiovascular Disease

## 2020-09-25 ENCOUNTER — Other Ambulatory Visit: Payer: Self-pay | Admitting: Endocrinology

## 2020-10-10 ENCOUNTER — Other Ambulatory Visit: Payer: Self-pay | Admitting: Endocrinology

## 2020-10-13 ENCOUNTER — Ambulatory Visit (INDEPENDENT_AMBULATORY_CARE_PROVIDER_SITE_OTHER): Payer: 59 | Admitting: Orthotics

## 2020-10-13 ENCOUNTER — Other Ambulatory Visit: Payer: Self-pay

## 2020-10-13 DIAGNOSIS — M2142 Flat foot [pes planus] (acquired), left foot: Secondary | ICD-10-CM | POA: Diagnosis not present

## 2020-10-13 DIAGNOSIS — B351 Tinea unguium: Secondary | ICD-10-CM

## 2020-10-13 DIAGNOSIS — M2141 Flat foot [pes planus] (acquired), right foot: Secondary | ICD-10-CM

## 2020-10-13 DIAGNOSIS — Z8639 Personal history of other endocrine, nutritional and metabolic disease: Secondary | ICD-10-CM

## 2020-10-13 DIAGNOSIS — R234 Changes in skin texture: Secondary | ICD-10-CM

## 2020-10-13 DIAGNOSIS — E1149 Type 2 diabetes mellitus with other diabetic neurological complication: Secondary | ICD-10-CM

## 2020-10-13 DIAGNOSIS — M79675 Pain in left toe(s): Secondary | ICD-10-CM

## 2020-10-13 DIAGNOSIS — M79674 Pain in right toe(s): Secondary | ICD-10-CM

## 2020-10-13 DIAGNOSIS — M2140 Flat foot [pes planus] (acquired), unspecified foot: Secondary | ICD-10-CM

## 2020-10-13 NOTE — Progress Notes (Signed)

## 2020-10-15 ENCOUNTER — Other Ambulatory Visit: Payer: Self-pay | Admitting: Endocrinology

## 2020-10-18 ENCOUNTER — Encounter: Payer: Self-pay | Admitting: Podiatry

## 2020-10-18 ENCOUNTER — Other Ambulatory Visit: Payer: Self-pay

## 2020-10-18 ENCOUNTER — Ambulatory Visit (INDEPENDENT_AMBULATORY_CARE_PROVIDER_SITE_OTHER): Payer: 59 | Admitting: Podiatry

## 2020-10-18 DIAGNOSIS — M79675 Pain in left toe(s): Secondary | ICD-10-CM

## 2020-10-18 DIAGNOSIS — E1149 Type 2 diabetes mellitus with other diabetic neurological complication: Secondary | ICD-10-CM | POA: Diagnosis not present

## 2020-10-18 DIAGNOSIS — B351 Tinea unguium: Secondary | ICD-10-CM | POA: Diagnosis not present

## 2020-10-18 DIAGNOSIS — N189 Chronic kidney disease, unspecified: Secondary | ICD-10-CM

## 2020-10-18 DIAGNOSIS — M79674 Pain in right toe(s): Secondary | ICD-10-CM

## 2020-10-18 NOTE — Progress Notes (Signed)
This patient returns to my office for at risk foot care.  This patient requires this care by a professional since this patient will be at risk due to having uncontrolled diabetes and kidney transplant.  This patient is unable to cut nails himself since the patient cannot reach his nails.These nails are painful walking and wearing shoes.  Resolved heel fissure. This patient presents for at risk foot care today.  General Appearance  Alert, conversant and in no acute stress.  Vascular  Dorsalis pedis and posterior tibial  pulses are palpable  bilaterally.  Capillary return is within normal limits  bilaterally. Temperature is within normal limits  bilaterally.  Neurologic  Senn-Weinstein monofilament wire test diminished   bilaterally. Muscle power within normal limits bilaterally.  Nails Thick disfigured discolored nails with subungual debris  from hallux to fifth toes bilaterally. No evidence of bacterial infection or drainage bilaterally.  Orthopedic  No limitations of motion  feet .  No crepitus or effusions noted.  No bony pathology or digital deformities noted. HAV  B/L.  Skin  normotropic skin with no porokeratosis noted bilaterally.  No signs of infections or ulcers noted.   Fissures left heel.  Onychomycosis  Pain in right toes  Pain in left toes  Fissures left heel.  Consent was obtained for treatment procedures.   Mechanical debridement of nails 1-5  bilaterally performed with a nail nipper.  Filed with dremel without incident.     Return office visit  4 months                   Told patient to return for periodic foot care and evaluation due to potential at risk complications.   Gardiner Barefoot DPM

## 2020-10-27 ENCOUNTER — Other Ambulatory Visit: Payer: Self-pay

## 2020-10-27 ENCOUNTER — Other Ambulatory Visit (INDEPENDENT_AMBULATORY_CARE_PROVIDER_SITE_OTHER): Payer: 59

## 2020-10-27 DIAGNOSIS — Z794 Long term (current) use of insulin: Secondary | ICD-10-CM

## 2020-10-27 DIAGNOSIS — E291 Testicular hypofunction: Secondary | ICD-10-CM | POA: Diagnosis not present

## 2020-10-27 DIAGNOSIS — E1165 Type 2 diabetes mellitus with hyperglycemia: Secondary | ICD-10-CM

## 2020-10-27 LAB — BASIC METABOLIC PANEL
BUN: 19 mg/dL (ref 6–23)
CO2: 29 mEq/L (ref 19–32)
Calcium: 10.3 mg/dL (ref 8.4–10.5)
Chloride: 105 mEq/L (ref 96–112)
Creatinine, Ser: 1.89 mg/dL — ABNORMAL HIGH (ref 0.40–1.50)
GFR: 37.86 mL/min — ABNORMAL LOW (ref >60.00)
Glucose, Bld: 111 mg/dL — ABNORMAL HIGH (ref 70–99)
Potassium: 4.5 mEq/L (ref 3.5–5.1)
Sodium: 139 mEq/L (ref 135–145)

## 2020-10-27 LAB — HEMOGLOBIN A1C: Hgb A1c MFr Bld: 6.9 % — ABNORMAL HIGH (ref 4.6–6.5)

## 2020-10-27 LAB — TESTOSTERONE: Testosterone: 366.67 ng/dL (ref 300.00–890.00)

## 2020-11-02 ENCOUNTER — Other Ambulatory Visit: Payer: Self-pay

## 2020-11-02 ENCOUNTER — Ambulatory Visit: Payer: 59 | Admitting: Endocrinology

## 2020-11-02 ENCOUNTER — Encounter: Payer: Self-pay | Admitting: Endocrinology

## 2020-11-02 VITALS — BP 132/88 | HR 65 | Ht 71.0 in | Wt 291.4 lb

## 2020-11-02 DIAGNOSIS — Z794 Long term (current) use of insulin: Secondary | ICD-10-CM | POA: Diagnosis not present

## 2020-11-02 DIAGNOSIS — E291 Testicular hypofunction: Secondary | ICD-10-CM | POA: Diagnosis not present

## 2020-11-02 DIAGNOSIS — E1165 Type 2 diabetes mellitus with hyperglycemia: Secondary | ICD-10-CM

## 2020-11-02 NOTE — Patient Instructions (Addendum)
Take 0.6mg  testosterone weekly  Basal at 12 am is 0.4 and at 5 am down to 0.7 units till 10 pm;

## 2020-11-02 NOTE — Progress Notes (Signed)
Patient ID: Robert [name], male   DOB: 1961-03-18, 59 y.o.   MRN: [id number]     Reason for Appointment: Endocrinology follow-up  History of Present Illness   DIABETES diagnosis date:  2012  Previous history: His blood sugar has been difficult to control since onset. Initially was taking oral hypoglycemic drugs like glyburide but could not take metformin because of renal dysfunction. Since Victoza did not help his control he was started on Lantus and then U 500 insulin also His A1c has previously ranged from 7.8-9.8, the lowest level in 03/2013 He was on Victoza but because of his markedly decreased appetite and weight loss this was stopped in 9/15  Recent history:   Non-hypoglycemic insulin regimen: was on Victoza 1.8 mg daily  His A1c is 6.9, previously 7.9 in 9/21 done at [place] settings with U-500 insulin:  Basal rate 12 AM = 0.5, 5 AM 12 noon = 0.8, 12 PM-10 PM = = 0.7 and 10 PM = 0.5 Carbohydrate ratio 1:20, correction factor I: 60 Bolus insulin with NovoLog FlexPen variable doses  Current blood sugar patterns, management and problems identified:    He has much improved blood sugar control with continuing the OmniPod that was started in November  Not clear if he is consistently taking any boluses with NovoLog injections and not using boluses from the pump  Blood sugar monitoring has been quite inadequate and only about once a day on an average  However in the last week or 2 his blood sugars in the mornings have been periodically low especially late morning with blood sugars as low as 55  He does have variable diet and periodically will have readings over 200 especially in the afternoon or evening  Not exercising again  Weight is slightly better  He was told to retry low doses of Victoza but he claims that it caused constipation and will not take it   Proper timing of medications in relation to meals: Yes.          Monitors blood glucose:   1-2 times a day .    Glucometer: Accu-Chek         Blood Glucose readings from insulin pump as follows   PRE-MEAL Fasting Lunch Dinner Bedtime Overall  Glucose range:  58-207  56-225  81 -210  218, 220   Mean/median:  117  119  156  168 138   Previous readings:  PRE-MEAL Fasting Lunch Dinner Bedtime Overall  Glucose range:  61-178  132-351  63-268  126-272   Mean/median:  140  198  172  164  166     Meals: 2-3 meals per day. 7-8 am and Noon and Dinner 5-7 PM .              Weight control:   Wt Readings from Last 3 Encounters:  11/02/20 291 lb 6.4 oz (132.2 kg)  09/14/20 296 lb (134.3 kg)  08/31/20 294 lb 6.4 oz (627.0 kg)        Complications:   nephropathy, neuropathy   Diabetes labs:   Lab Results  Component Value Date   HGBA1C 6.9 (H) 10/27/2020   HGBA1C 8.6 (H) 05/31/2020   HGBA1C 8.7 (H) 02/12/2020   Lab Results  Component Value Date   MICROALBUR 146.3 (H) 08/03/2019   LDLCALC 75 08/31/2020   CREATININE 1.89 (H) 10/27/2020    Lab Results  Component Value Date   FRUCTOSAMINE 375 (H) 03/31/2020   FRUCTOSAMINE 367 (  H) 02/12/2020   FRUCTOSAMINE 281 04/29/2019   Lab Results  Component Value Date   HGB 13.8 05/31/2020     Lab on 10/27/2020  Component Date Value Ref Range Status  . Testosterone 10/27/2020 366.67  300.00 - 890.00 ng/dL Final  . Sodium 10/27/2020 139  135 - 145 mEq/L Final  . Potassium 10/27/2020 4.5  3.5 - 5.1 mEq/L Final  . Chloride 10/27/2020 105  96 - 112 mEq/L Final  . CO2 10/27/2020 29  19 - 32 mEq/L Final  . Glucose, Bld 10/27/2020 111* 70 - 99 mg/dL Final  . BUN 10/27/2020 19  6 - 23 mg/dL Final  . Creatinine, Ser 10/27/2020 1.89* 0.40 - 1.50 mg/dL Final  . GFR 10/27/2020 37.86* >60.00 mL/min Final   Calculated using the CKD-EPI Creatinine Equation (2021)  . Calcium 10/27/2020 10.3  8.4 - 10.5 mg/dL Final  . Hgb A1c MFr Bld 10/27/2020 6.9* 4.6 - 6.5 % Final   Glycemic Control Guidelines for People with Diabetes:Non Diabetic:   <6%Goal of Therapy: <7%Additional Action Suggested:  >8%     OTHER problems: See review of systems    Allergies as of 11/02/2020   No Known Allergies     Medication List       Accurate as of November 02, 2020  8:51 AM. If you have any questions, ask your nurse or doctor.        Accu-Chek [employer] Kit 1 each by Does not apply route daily.   Accu-Chek FastClix Lancets Misc Use lancets to check blood sugar 5 times daily.   amLODipine 10 MG tablet Commonly known as: NORVASC Take 1 tablet (10 mg total) by mouth daily.   amoxicillin 500 MG tablet Commonly known as: AMOXIL Take 1,000 mg by mouth 2 (two) times daily. Before dental visits   aspirin EC 81 MG tablet Take 81 mg by mouth daily.   B-D UF III MINI PEN NEEDLES 31G X 5 MM Misc Generic drug: Insulin Pen Needle USE 4 PER DAY TO INJECT INSULIN.   BD Hypodermic Needle 18G X 1" Misc Generic drug: NEEDLE (DISP) 18 G Use 2 needles once weekly for Testosterone medication. Use 1 needle to draw up medication and 2'nd needle to inject into muscle.   DULoxetine 60 MG capsule Commonly known as: CYMBALTA Take 60 mg by mouth daily.   Fifty50 Glucose Meter 2.0 w/Device Kit Use as directed   Accu-Chek Guide w/Device Kit 1 each by Does not apply route daily. Use meter to check blood sugar 5 times daily.   fluocinonide cream 0.05 % Commonly known as: LIDEX Apply 1 application topically daily as needed (Skin inflammation (hand, thigh, back)).   furosemide 20 MG tablet Commonly known as: LASIX Take 20 mg by mouth daily.   gabapentin 100 MG capsule Commonly known as: NEURONTIN Take 200-300 mg by mouth See admin instructions. 200 mg in the morning and 300 mg at bedtime   glucose blood test strip Commonly known as: Accu-Chek Guide 1 each by Other route as needed for other. Use as instructed to check blood sugar 5 times daily.   HUMULIN R 500 UNIT/ML injection Generic drug: insulin regular human CONCENTRATED USE MAX  200 UNITS IN PUMP EVERY THREE DAYS   Insulin Syringe-Needle U-100 31G X 5/16" 0.3 ML Misc Commonly known as: BD Insulin Syringe U/F Use to inject insulin 2-3 times per day   BD Insulin Syringe U/F 31G X 5/16" 1 ML Misc Generic drug: Insulin Syringe-Needle U-100 USE 3  TIMES A DAY WITH HUMULIN R   lactulose 10 GM/15ML solution Commonly known as: CHRONULAC Take 10 g by mouth daily as needed.   levothyroxine 100 MCG tablet Commonly known as: SYNTHROID TAKE 1 TABLET BY MOUTH EVERY DAY   lisinopril 20 MG tablet Commonly known as: ZESTRIL Take 20 mg by mouth 2 (two) times a day. Take 1 tablet ($RemoveB'20mg'bxKDHojv$  total) by mouth twice daily.   metoprolol tartrate 50 MG tablet Commonly known as: LOPRESSOR TAKE 1 TABLET BY MOUTH TWICE A DAY   mycophenolate 250 MG capsule Commonly known as: CELLCEPT Take 1,000 mg by mouth every 12 (twelve) hours.   NovoLOG FlexPen 100 UNIT/ML FlexPen Generic drug: insulin aspart Inject 50 Units into the skin 3 (three) times daily with meals. What changed: additional instructions   Nulojix 250 MG Solr injection Generic drug: belatacept Inject 24.5 mLs into the vein every 28 (twenty-eight) days.   predniSONE 5 MG tablet Commonly known as: DELTASONE Take 5 mg by mouth daily with breakfast.   rosuvastatin 20 MG tablet Commonly known as: CRESTOR Take 20 mg by mouth at bedtime.   senna-docusate 8.6-50 MG tablet Commonly known as: Senokot-S Take 1 tablet by mouth as needed for mild constipation.   Syringe Luer Lock 3 ML Misc Use 1 syringe once weekly to inject Testosterone medication.   tamsulosin 0.4 MG Caps capsule Commonly known as: FLOMAX Take 0.8 mg by mouth at bedtime.   testosterone cypionate 200 MG/ML injection Commonly known as: DEPOTESTOSTERONE CYPIONATE Inject 0.6 mLs (120 mg total) into the muscle every 7 (seven) days.   traMADol 50 MG tablet Commonly known as: ULTRAM Take 1 tablet (50 mg total) by mouth every 6 (six) hours as needed.    traZODone 50 MG tablet Commonly known as: DESYREL Take 50-100 mg by mouth at bedtime as needed for sleep.   UNABLE TO FIND BiPAP: At bedtime   zolpidem 10 MG tablet Commonly known as: AMBIEN Take 10-20 mg by mouth at bedtime as needed for sleep.       Allergies: No Known Allergies  Past Medical History:  Diagnosis Date  . Anemia    low hgb at present  . Arthritis    FEET  . Blood transfusion without reported diagnosis 1974   kidney removed - L  . Cancer ([place])    skin ca right shoulder, plastic dsyplasia, pre-Ca polpys removed on Colonoscopy- 07/2014  . Colon polyps 07/23/2014   Tubular adenomas x 5  . Constipation   . Diabetes mellitus without complication (HCC)    Type 2  . Difficult intubation    unsure of actual problem but it was during the January 28, 2015 procedure.  Marland Kitchen Dysrhythmia   . Eczema   . ESRD (end stage renal disease) ([place])    hEMO mwf  . Headache(784.0)    slight dull h/a due kidney failure  . Hyperlipidemia   . Hypertension    SVT h/o , followed by Dr. Claiborne Billings  . Hypothyroidism   . Macular degeneration   . Neuropathy    right hand and both feet  . PAT (paroxysmal atrial tachycardia) ([place])   . PSVT (paroxysmal supraventricular tachycardia) ([place])   . SBO (small bowel obstruction) ([place])   . Shortness of breath    with exertion  . Sleep apnea 06/18/11   split-night sleep study- [facility]., BiPAP- 13-16  . Thyroid disease   . Umbilical hernia    will repair with thi surgery  Past Surgical History:  Procedure Laterality Date  . AV FISTULA PLACEMENT Right 02/25/2015   Procedure: RIGHT ARTERIOVENOUS (AV) FISTULA CREATION;  Surgeon: Serafina Mitchell, MD;  Location: [place];  Service: Vascular;  Laterality: Right;  . AV FISTULA PLACEMENT Right 09/15/2015   Procedure: ARTERIOVENOUS (AV) FISTULA CREATION- RIGHT ARM;  Surgeon: Serafina Mitchell, MD;  Location: [place];  Service: Vascular;  Laterality: Right;  . CAPD INSERTION N/A  05/18/2014   Procedure: LAPAROSCOPIC INSERTION CONTINUOUS AMBULATORY PERITONEAL DIALYSIS  (CAPD) CATHETER;  Surgeon: Ralene Ok, MD;  Location: [place];  Service: General;  Laterality: N/A;  . COLONOSCOPY    . COLONOSCOPY WITH PROPOFOL N/A 07/12/2016   Procedure: COLONOSCOPY WITH PROPOFOL;  Surgeon: [name], MD;  Location: [facility];  Service: Endoscopy;  Laterality: N/A;  . declotting of fistula  08/2015  . ESOPHAGOGASTRODUODENOSCOPY (EGD) WITH PROPOFOL N/A 07/12/2016   Procedure: ESOPHAGOGASTRODUODENOSCOPY (EGD) WITH PROPOFOL;  Surgeon: [name], MD;  [facility]: [facility];  Service: Endoscopy;  Laterality: N/A;  . FISTULA SUPERFICIALIZATION Right 02/07/2016   Procedure: RIIGHT UPPER ARM FISTULA SUPERFICIALIZATION;  Surgeon: Elam Dutch, MD;  Location: [place];  Service: Vascular;  Laterality: Right;  . IJ catheter insertion    . INSERTION OF DIALYSIS CATHETER Right 02/25/2015   Procedure: INSERTION OF RIGHT INTERNAL JUGULAR DIALYSIS CATHETER;  Surgeon: Serafina Mitchell, MD;  Location: [place];  Service: Vascular;  Laterality: Right;  . KIDNEY TRANSPLANT    . LAPAROSCOPIC REPOSITIONING CAPD CATHETER N/A 06/16/2014   Procedure: LAPAROSCOPIC REPOSITIONING CAPD CATHETER;  Surgeon: Ralene Ok, MD;  Location: [place];  Service: General;  Laterality: N/A;  . LAPAROSCOPY N/A 05/04/2015   Procedure: LAPAROSCOPY DIAGNOSTIC LYSIS OF ADHESIONS;  Surgeon: Coralie Keens, MD;  Location: [place];  Service: General;  Laterality: N/A;  . MINOR REMOVAL OF PERITONEAL DIALYSIS CATHETER N/A 04/28/2015   Procedure:  REMOVAL OF PERITONEAL DIALYSIS CATHETER;  Surgeon: Coralie Keens, MD;  Location: [place];  Service: General;  Laterality: N/A;  . NEPHRECTOMY Left 1974  . RENAL BIOPSY Right 2012  . REVISON OF ARTERIOVENOUS FISTULA Right 05/26/2015   Procedure: SUPERFICIALIZATION OF ARTERIOVENOUS FISTULA WITH SIDE BRANCH LIGATIONS;  Surgeon: Serafina Mitchell, MD;  Location: [place];  Service: Vascular;   Laterality: Right;  . SKIN CANCER EXCISION     right shoulder  . SVT ABLATION N/A 11/23/2016   Procedure: SVT Ablation;  Surgeon: Will Meredith Leeds, MD;  Location: [place] [facility];  Service: Cardiovascular;  Laterality: N/A;  . UMBILICAL HERNIA REPAIR N/A 05/18/2014   Procedure: HERNIA REPAIR UMBILICAL ADULT;  Surgeon: Ralene Ok, MD;  Location: [place];  Service: General;  Laterality: N/A;  . UMBILICAL HERNIA REPAIR N/A 04/28/2015   Procedure: UMBILICAL HERNIA REPAIR WITH MESH;  Surgeon: Coralie Keens, MD;  [facility];  Service: General;  Laterality: N/A;    Family History  Problem Relation Age of Onset  . Colon polyps Father   . Colon cancer Neg Hx   . Stomach cancer Neg Hx     Social History:  reports that he quit smoking about 7 years ago. His smoking use included cigars. He has never used smokeless tobacco. He reports current alcohol use. He reports that he does not use drugs.  Review of Systems:  Hypertension:    His blood pressure has been managed by his nephrologist and transplant team monitors BP at home also  BP Readings from Last 3 Encounters:  11/02/20 132/88  09/14/20 Marland Kitchen)  160/88  08/31/20 (!) 138/94    Lipids:  His LDL is below 100 with 20 mg Crestor, he thinks the dose was increased in 9/21 Has no evidence of vascular disease     Lab Results  Component Value Date   CHOL 147 08/31/2020   CHOL 158 07/07/2019   CHOL 153 04/29/2019   Lab Results  Component Value Date   HDL 53.40 08/31/2020   HDL 47 07/07/2019   HDL 54.30 04/29/2019   Lab Results  Component Value Date   LDLCALC 75 08/31/2020   LDLCALC 92 07/07/2019   LDLCALC 80 04/29/2019   Lab Results  Component Value Date   TRIG 97.0 08/31/2020   TRIG 93 07/07/2019   TRIG 92.0 04/29/2019   Lab Results  Component Value Date   CHOLHDL 3 08/31/2020   CHOLHDL 3 04/29/2019   CHOLHDL 3 07/10/2018   No results found for: LDLDIRECT   HYPOTHYROIDISM: He has had long-standing  hypothyroidism  Has been taking his 100 g levothyroxine before breakfast daily  TSH normal  Lab Results  Component Value Date   TSH 1.25 08/31/2020       HYPOGONADISM: 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7     Lab Results  Component Value Date   TESTOSTERONE 366.67 10/27/2020   TESTOSTERONE 267.85 (L) 08/31/2020   TESTOSTERONE 181.28 (L) 05/31/2020    He has depression, taking 30 mg of Cymbalta; followed by PCP  Diabetic neuropathy: Has mild sensory loss on his last exam    Examination:   BP 132/88   Pulse 65   Ht 5\' 11"  (1.803 m)   Wt 291 lb 6.4 oz (132.2 kg)   SpO2 98%   BMI 40.64 kg/m   Body mass index is 40.64 kg/m.    ASSESSMENT/ PLAN:    Diabetes type 2 with obesity, insulin-requiring:   See history of present illness for detailed discussion of his current management, problems identified and sugar patterns on his meter download  Has been on basal insulin on his OmniPod pump with U-500, Victoza and mealtime insulin using NovoLog FlexPen currently  A1c is better at 6.9 compared to 7.9  With using the insulin pump and U-500 insulin for his basal his blood sugars are excellent despite not taking Victoza Weight has come down slightly Unable to assess his boluses since he is not using his pump and has been trying to use up his  NovoLog for bolus insulin Not able to exercise He is still not comfortable programming his insulin pump Recently has had low normal or low readings on waking up or late morning  He cannot afford the freestyle libre or any other CGM system and monitoring is quite inadequate  Recommendations:  Basal insulin 12 AM = 0.4 and 5 AM = 0.7  This was changed in the office  Needs to take consistent amounts of  boluses even if doing NovoLog injections for any meals that have carbohydrates  He could also use the pump to bolus 2 to 3 units for high carbohydrate  Needs to check the blood sugar 4 times a day especially after meals to help assess his boluses  We will check with the local representative about getting the dashsystem for his pump  Testosterone injection to be 0.6 instead of 0.7 mL weekly  Renal function results forwarded to nephrologist at [facility]  There are no Patient Instructions on file for this visit.     Elayne Snare 11/02/2020, 8:51 AM

## 2020-11-16 ENCOUNTER — Other Ambulatory Visit: Payer: Self-pay

## 2020-11-16 ENCOUNTER — Ambulatory Visit (HOSPITAL_COMMUNITY): Payer: 59 | Attending: Cardiovascular Disease

## 2020-11-16 DIAGNOSIS — I350 Nonrheumatic aortic (valve) stenosis: Secondary | ICD-10-CM | POA: Insufficient documentation

## 2020-11-16 LAB — ECHOCARDIOGRAM COMPLETE
AR max vel: 0.99 cm2
AV Area VTI: 1.05 cm2
AV Area mean vel: 0.9 cm2
AV Mean grad: 34.6 mmHg
AV Peak grad: 57 mmHg
Ao pk vel: 3.78 m/s
Area-P 1/2: 2.79 cm2
S' Lateral: 2.8 cm

## 2020-12-01 ENCOUNTER — Ambulatory Visit: Payer: BC Managed Care – PPO | Admitting: Cardiovascular Disease

## 2020-12-01 ENCOUNTER — Other Ambulatory Visit: Payer: Self-pay

## 2020-12-01 ENCOUNTER — Encounter: Payer: Self-pay | Admitting: Cardiovascular Disease

## 2020-12-01 VITALS — BP 132/70 | HR 76 | Ht 71.0 in | Wt 296.4 lb

## 2020-12-01 DIAGNOSIS — I350 Nonrheumatic aortic (valve) stenosis: Secondary | ICD-10-CM | POA: Diagnosis not present

## 2020-12-01 DIAGNOSIS — Z9989 Dependence on other enabling machines and devices: Secondary | ICD-10-CM

## 2020-12-01 DIAGNOSIS — I10 Essential (primary) hypertension: Secondary | ICD-10-CM

## 2020-12-01 DIAGNOSIS — E039 Hypothyroidism, unspecified: Secondary | ICD-10-CM

## 2020-12-01 DIAGNOSIS — E785 Hyperlipidemia, unspecified: Secondary | ICD-10-CM | POA: Diagnosis not present

## 2020-12-01 DIAGNOSIS — Z940 Kidney transplant status: Secondary | ICD-10-CM

## 2020-12-01 DIAGNOSIS — G4733 Obstructive sleep apnea (adult) (pediatric): Secondary | ICD-10-CM

## 2020-12-01 DIAGNOSIS — N1832 Chronic kidney disease, stage 3b: Secondary | ICD-10-CM

## 2020-12-01 NOTE — Patient Instructions (Signed)
Medication Instructions:  DECREASE- Lisinopril 20 mg by mouth daily TAKE- Furosemide(Lasix) 20 mg by mouth daily  *If you need a refill on your cardiac medications before your next appointment, please call your pharmacy*   Lab Work: None Ordered   Testing/Procedures: Your physician has requested that you have an echocardiogram in 6 Months. Echocardiography is a painless test that uses sound waves to create images of your heart. It provides your doctor with information about the size and shape of your heart and how well your heart's chambers and valves are working. This procedure takes approximately one hour. There are no restrictions for this procedure.   Follow-Up: At [facility], you and your health needs are our priority.  As part of our continuing mission to provide you with exceptional heart care, we have created designated Provider Care Teams.  These Care Teams include your primary Cardiologist (physician) and Advanced Practice Providers (APPs -  Physician Assistants and Nurse Practitioners) who all work together to provide you with the care you need, when you need it.  We recommend signing up for the patient portal called "MyChart".  Sign up information is provided on this After Visit Summary.  MyChart is used to connect with patients for Virtual Visits (Telemedicine).  Patients are able to view lab/test results, encounter notes, upcoming appointments, etc.  Non-urgent messages can be sent to your provider as well.   To learn more about what you can do with MyChart, go to [email or web address].    Your next appointment:   6 month(s)  The format for your next appointment:   In Person  Provider:   You may see Shelva Majestic, MD or one of the following Advanced Practice Providers on your designated Care Team:    Almyra Deforest, PA-C  Fabian Sharp, PA-C or   Roby Lofts, [place]

## 2020-12-01 NOTE — Progress Notes (Signed)
Patient ID: Shishir K Fulop, male   DOB: 02/03/1961, 60 y.o.   MRN: [id number]     HPI: Jeriah K Pew, is a 60 y.o. male who is a former patient of Dr. Richard Weintraub. He presents for a 6 month follow-up evaluation.  Mr. Susano Ekdahl  has a complex medical history that includes a congenital nonfunctioning left kidney for which he underwent left nephrectomy at age 60. He has a history of obesity, diabetes mellitus, and has been demonstrated to have proteinuria of his remaining single kidney, being status post open renal biopsy in [place] in November 2010. This revealed focal segmental glomerular sclerosis. At that time he was seen by Dr. Michael Rocco in the nephrology department at [facility]. He sees Dr. Goldsborough in [place] for his kidney insufficiency and has been on ARB therapy in the past in an attempt to reduce proteinuria. He sees Dr. Kumar for his diabetes mellitus. He also has a history of hypothyroidism, hyperlipidemia, anxiety and depression. He has a history of prior supraventricular tachycardia requiring hospitalization in 2007 as was felt to be possibly AV nodal reentrant rhythm.  He has a history of complex sleep apnea initially diagnosed in 2007 with an AHI at that time of 56.7 per hour and during REM sleep this increased to 65.7 per hour. At that time he dropped his oxygen 84% and had loud snoring. He had been on BiPAP therapy. He underwent a 5 year followup split-night study in 2012 which again confirmed severe sleep apnea with an AHI of 60.7 on the baseline portion of the study. He was unable to achieve REM sleep at that time. Oxygen saturation during non-REM sleep at 83%. He was titrated utilizing BiPAP up to a setting of 16/12 cm water pressure. He does admit to continued residual daytime sleepiness despite using his BiPAP therapy.  An echo Doppler study in August 2012 showed mild concentric LVH with normal systolic function and grade 1  diastolic dysfunction. He had trace mitral and tricuspid insufficiency as well as pulmonic insufficiency. A nuclear perfusion study in August 2012 showed normal perfusion.  Mr. Gallogly was on dialysis Monday, [name].  He is on the [facility] transplant list.  In January 2017 he had an stress echo at [facility] as part of his transplant evaluation.  I do not have the results of this evaluation.  He has had some difficulty with a steal syndrome in his right hand since he had AV fistulas inserted in the forearm and most recently now in the upper arm.  He continues to use BiPAP with 100% compliance.  He had a ResMed S9VPAP Auto unit.  He obtains his CPAP supplies online because they are cheaper, but had seen [facility].  He is using a Mirage Quatro full face mask.  A download in the office from 05/28/2016 through 07/26/2016.  He is meeting compliance standards with usage at 85% usage stays greater than 4 hours at 77%.  He is averaging 6 hours and 43 minutes per night.  He is set at an IPAP pressure of 16 and EPAP pressure of 12.  His AHI was mildly increased at 6.5 with 5.2 per hour of apneic events and 1.3 of hypopnea events.  When I last saw him he had stopped taken Bystolic since his blood pressure had become low at the end of dialysis.  He had been on 5 mg on nondialysis days.  He tells me his aspirin was discontinued because he was   continuously having issues with bleeding following his dialysis when the catheter was removed.   He had an episode of SVT in December 2017, which was successfully aborted with vagal maneuvers.  In the emergency room, he had 2-3 more recurrences of SVT, of which, again, all aborted with vagal maneuvers.  He was started on verapamil and discharge from the hospital.  He has seen Dr. Camnitz and had an EP study with attempted ablation, but apparently he was not able to be induced and ablation was not performed.   He denies any recurrent episodes of SVT.  Remotely, he  may of had some mild nonexertional musculoskeletal discomfort but has not experienced any of this recently.  Reportedly, his remote stress echo was negative for ischemia, although mild ST changes were noted in recovery.  He continues to be asymptomatic.  He underwent a kidney transplant from a living donor on 02/25/2017 at [facility].  He tolerated surgery well but has developed a postoperative inguinal hernia, which will ultimately need to be surgically repaired.  He received a new BiPAP machine from [facility] 2 weeks ago.  He has a full face mask, but admits to increased leak.    Since I saw him in September 2018, he had 2 hospital stays at [facility].  He developed intestinal bleeding due to diverticular disease and also had some dehydration issues.  He also underwent inguinal hernia surgery.  He denies any recent chest pain or palpitations.  He continues to use BiPAP with 100% compliance.  However, he did not have his machine with him for the days he was in the hospital.  He brought his machine with him today for interrogation.  Over the past month, usage hours and 6 hours and 32 minutes per night with an HI of 0.9.  He used to machine 27 of 30 nights nights that were not used was because he was in the hospital.  Usage greater than 4 hours was 26 of 30.  He is on a BiPAP pressure of 18/13 0.9.  Recently he has noticed a mild leak.  AHI was 5.6 over the past 7 days.  He has a ResMed fullface mask, Mirage Quatro mask.    Since I last saw him in November 2018, he has felt well.  He is seeing Dr. Matthew Ellis at [facility] from a nephrology standpoint.  He denies any episodes of chest pain or awareness of recurrent arrhythmia.  He admits to 100% use with CPAP.  [employer] is his DME company.  He has a ResMed air 10 V auto unit with an EPAP pressure 14.  An IPAP pressure set at 18.  A download from 01/14/2017 through 02/12/2018 shows 100% compliance.  He is averaging 8 hours and 7 minutes of sleep.  AHI is excellent at  2.6.    I saw him in May 2019 and since that time, he has continued to follow at [facility]. He was last evaluated by me in a telemedicine visit in April 2020.  At that time he had noticed that over the past year  his blood pressure had increased and his lisinopril dose was increased to 20 mg and amlodipine reduced to 5 mg.  His weight had fluctuated and has increased from 293 to 303.  He has continued to use BiPAP with 100% compliance.  I was able to obtain a download today from March 23 through January 27, 2019.  He is 100% compliant with average BiPAP use of 7 hours and 10 minutes.    At his 18/14 pressure, AHI remains excellent at 2.6/h.  Over the past 6 months he has changed jobs twice.  He is been staying home particularly with his significant comorbidities which can be affected by the coronavirus including hypertension, diabetes mellitus, obesity, renal disease, and immunosuppression.  He walks approximately 2 times a day for short duration.  He receives immunosuppressant infusion with [employer] and is scheduled for his next infusion: On Feb 12, 2019.  His last lipid panel was in October 2019 with total cholesterol 137 and LDL cholesterol 73.  He is scheduled to see his nephrologist in August.    I saw him in the office in February 2021 and since his  telemedicine evaluation in April 2020, he continued to be followed closely at [facility]. Creatinine had increased to 1.74. He has noticed blood pressure elevation. He has not been successful with weight loss. He has been without anginal symptoms or awareness of recurrent SVT. He has continued to use BiPAP therapy. A download from 10/22/2019 through 11/20/1999 shows 93% of usage days. Average use is 8 hours and 10 minutes. At his pressure of 18/14, AHI is excellent at 1.5. There is no mask leak.  During his February 2021 evaluation he had a 2/6 murmur consistent with aortic stenosis and I recommended follow-up echo Doppler evaluation.  He underwent an echo Doppler  study on December 08, 2019 which showed an EF of 65 to 70%, moderate left ventricular hypertrophy and grade 1 diastolic dysfunction.  There was mild mitral valve regurgitation.  There was moderate to severe aortic stenosis with a mean gradient of 39 peak gradient of 58.  He was evaluated in a telemedicine visit in June 2021.  At that time he denied any chest pain, presyncope or syncope.  He continues to go to [facility] for nephrology follow-up.  He states his blood pressure is stable every morning upon awakening but as a day progresses it may go up slightly.  He denies any exertional shortness of breath.  At times he has noticed some mild lower extremity edema.  Since I last saw him he was evaluated at [facility] in July 2021 and was hospitalized overnight with right lower quadrant pain around his renal allograft.  A CT scan demonstrated normal-appearing transplant kidney without any acute abdominal/pelvic abnormalities however, significant stool burden was noted on CT suggesting moderate to severe constipation.  I last saw him in December 2021 at which time he denied any chest pain but had experienced some mild shortness of breath particularly with walking up steps.Marland Kitchen He is now on insulin pump.  He has noticed some lower extremity edema.  He continued to use BiPAP.  A download was obtained in the office today from November 7 through September 12, 2020 which shows excellent compliance.  There were only 2 days of nonuse when he never went to bed and it stayed in a recliner.  He is on BiPAP with a pressure of 18/14.  He is averaging 8 hours and 54 minutes of BiPAP use per night.  AHI is excellent at 2.0.  He does have mask leak and has been noticing a leak in the region of his chin.    I recommended he have a follow-up echo Doppler study which was done on November 16, 2020.  This showed continued hyperdynamic LV function with EF at 65 to 70% and moderate LVH with grade 2 diastolic dysfunction.  RV size was normal.  Moderately  elevated pulmonary artery systolic pressure at 45 mm.  There was mild mitral stenosis with a mean valve gradient of 3.2 mm with moderately severe mitral annular calcification.  There was moderate calcification of his trileaflet aortic valve.  He was felt to have moderate aortic stenosis with a mean gradient of 34.6 and peak gradient of 57 with calculated valve area 1.05 cm.  Presently, he continues to experience some leg edema and admits to shortness of breath with activity.  He is followed closely at [facility] for his renal transplant.  Most recent creatinine on October 27, 2020 is further increased at 1.89.  Hemoglobin A1c was 6.9. He denies palpitations.  He continues to use BiPAP.  I obtained a new download in the office today from January 25 through November 30, 2020.  Compliance is excellent with average use at 10 hours and 16 minutes per night.  At his BiPAP pressure of 18/14, AHI is 2.3.  There are nights with significant mask leak.  He presents for evaluation.   Past Medical History:  Diagnosis Date  . Anemia    low hgb at present  . Arthritis    FEET  . Blood transfusion without reported diagnosis 1974   kidney removed - L  . Cancer ([place])    skin ca right shoulder, plastic dsyplasia, pre-Ca polpys removed on Colonoscopy- 07/2014  . Colon polyps 07/23/2014   Tubular adenomas x 5  . Constipation   . Diabetes mellitus without complication (HCC)    Type 2  . Difficult intubation    unsure of actual problem but it was during the January 28, 2015 procedure.  Marland Kitchen Dysrhythmia   . Eczema   . ESRD (end stage renal disease) ([place])    hEMO mwf  . Headache(784.0)    slight dull h/a due kidney failure  . Hyperlipidemia   . Hypertension    SVT h/o , followed by Dr. Claiborne Billings  . Hypothyroidism   . Macular degeneration   . Neuropathy    right hand and both feet  . PAT (paroxysmal atrial tachycardia) ([place])   . PSVT (paroxysmal supraventricular tachycardia) ([place])   . SBO (small bowel obstruction)  ([place])   . Shortness of breath    with exertion  . Sleep apnea 06/18/11   split-night sleep study- [facility]., BiPAP- 13-16  . Thyroid disease   . Umbilical hernia    will repair with thi surgery    Past Surgical History:  Procedure Laterality Date  . AV FISTULA PLACEMENT Right 02/25/2015   Procedure: RIGHT ARTERIOVENOUS (AV) FISTULA CREATION;  Surgeon: Serafina Mitchell, MD;  Location: [place];  Service: Vascular;  Laterality: Right;  . AV FISTULA PLACEMENT Right 09/15/2015   Procedure: ARTERIOVENOUS (AV) FISTULA CREATION- RIGHT ARM;  Surgeon: Serafina Mitchell, MD;  Location: [place];  Service: Vascular;  Laterality: Right;  . CAPD INSERTION N/A 05/18/2014   Procedure: LAPAROSCOPIC INSERTION CONTINUOUS AMBULATORY PERITONEAL DIALYSIS  (CAPD) CATHETER;  Surgeon: Ralene Ok, MD;  Location: [place];  Service: General;  Laterality: N/A;  . COLONOSCOPY    . COLONOSCOPY WITH PROPOFOL N/A 07/12/2016   Procedure: COLONOSCOPY WITH PROPOFOL;  Surgeon: [name], MD;  Location: [facility];  Service: Endoscopy;  Laterality: N/A;  . declotting of fistula  08/2015  . ESOPHAGOGASTRODUODENOSCOPY (EGD) WITH PROPOFOL N/A 07/12/2016   Procedure: ESOPHAGOGASTRODUODENOSCOPY (EGD) WITH PROPOFOL;  Surgeon: [name], MD;  Location: [facility];  Service: Endoscopy;  Laterality: N/A;  . FISTULA SUPERFICIALIZATION Right 02/07/2016   Procedure: RIIGHT UPPER ARM FISTULA  SUPERFICIALIZATION;  Surgeon: Elam Dutch, MD;  Location: [facility] OR;  Service: Vascular;  Laterality: Right;  . IJ catheter insertion    . INSERTION OF DIALYSIS CATHETER Right 02/25/2015   Procedure: INSERTION OF RIGHT INTERNAL JUGULAR DIALYSIS CATHETER;  Surgeon: Serafina Mitchell, MD;  Location: [place];  Service: Vascular;  Laterality: Right;  . KIDNEY TRANSPLANT    . LAPAROSCOPIC REPOSITIONING CAPD CATHETER N/A 06/16/2014   Procedure: LAPAROSCOPIC REPOSITIONING CAPD CATHETER;  Surgeon: Ralene Ok, MD;  Location: [place];  Service: General;  Laterality: N/A;  . LAPAROSCOPY N/A 05/04/2015   Procedure: LAPAROSCOPY DIAGNOSTIC LYSIS OF ADHESIONS;  Surgeon: Coralie Keens, MD;  Location: [place];  Service: General;  Laterality: N/A;  . MINOR REMOVAL OF PERITONEAL DIALYSIS CATHETER N/A 04/28/2015   Procedure:  REMOVAL OF PERITONEAL DIALYSIS CATHETER;  Surgeon: Coralie Keens, MD;  Location: [place];  Service: General;  Laterality: N/A;  . NEPHRECTOMY Left 1974  . RENAL BIOPSY Right 2012  . REVISON OF ARTERIOVENOUS FISTULA Right 05/26/2015   Procedure: SUPERFICIALIZATION OF ARTERIOVENOUS FISTULA WITH SIDE BRANCH LIGATIONS;  Surgeon: Serafina Mitchell, MD;  Location: [place];  Service: Vascular;  Laterality: Right;  . SKIN CANCER EXCISION     right shoulder  . SVT ABLATION N/A 11/23/2016   Procedure: SVT Ablation;  Surgeon: Will Meredith Leeds, MD;  Location: [place] CV LAB;  Service: Cardiovascular;  Laterality: N/A;  . UMBILICAL HERNIA REPAIR N/A 05/18/2014   Procedure: HERNIA REPAIR UMBILICAL ADULT;  Surgeon: Ralene Ok, MD;  Location: [place];  Service: General;  Laterality: N/A;  . UMBILICAL HERNIA REPAIR N/A 04/28/2015   Procedure: UMBILICAL HERNIA REPAIR WITH MESH;  Surgeon: Coralie Keens, MD;  Location: [place];  Service: General;  Laterality: N/A;    Left nephrectomy in 1974.  No Known Allergies  Current Outpatient Medications  Medication Sig Dispense Refill  . ACCU-CHEK FASTCLIX LANCETS MISC Use lancets to check blood sugar 5 times daily. 500 each 3  . amoxicillin (AMOXIL) 500 MG tablet Take 1,000 mg by mouth 2 (two) times daily. Before dental visits    . aspirin EC 81 MG tablet Take 81 mg by mouth daily.    . B-D UF III MINI PEN NEEDLES 31G X 5 MM MISC USE 4 PER DAY TO INJECT INSULIN. 100 each 0  . BD INSULIN SYRINGE U/F 31G X 5/16" 1 ML MISC USE 3 TIMES A DAY WITH HUMULIN R 90 each 2  . belatacept (NULOJIX) 250 MG SOLR injection Inject 24.5 mLs into the vein every 28 (twenty-eight) days.     .  Blood Glucose Monitoring Suppl (ACCU-CHEK GUIDE) w/Device KIT 1 each by Does not apply route daily. Use meter to check blood sugar 5 times daily. 1 kit 0  . Blood Glucose Monitoring Suppl (FIFTY50 GLUCOSE METER 2.0) w/Device KIT Use as directed    . DULoxetine (CYMBALTA) 60 MG capsule Take 60 mg by mouth daily.    . fluocinonide cream (LIDEX) 5.63 % Apply 1 application topically daily as needed (Skin inflammation (hand, thigh, back)).     . furosemide (LASIX) 20 MG tablet Take 20 mg by mouth daily.    Marland Kitchen gabapentin (NEURONTIN) 100 MG capsule Take 200-300 mg by mouth See admin instructions. 200 mg in the morning and 300 mg at bedtime    . glucose blood (ACCU-CHEK GUIDE) test strip 1 each by Other route as needed for other. Use as instructed to check blood sugar 5 times daily. 500 each 3  .  HUMULIN R 500 UNIT/ML injection USE MAX 200 UNITS IN PUMP EVERY THREE DAYS 60 mL 3  . insulin aspart (NOVOLOG FLEXPEN) 100 UNIT/ML FlexPen Inject 50 Units into the skin 3 (three) times daily with meals. (Patient taking differently: Inject 50 Units into the skin 3 (three) times daily with meals. States he takes 10-15 units maybe once a day) 135 mL 1  . Insulin Syringe-Needle U-100 (BD INSULIN SYRINGE ULTRAFINE) 31G X 5/16" 0.3 ML MISC Use to inject insulin 2-3 times per day 300 each 1  . lactulose (CHRONULAC) 10 GM/15ML solution Take 10 g by mouth daily as needed.     . Lancets Misc. (ACCU-CHEK FASTCLIX LANCET) KIT 1 each by Does not apply route daily. 1 kit 0  . levothyroxine (SYNTHROID) 100 MCG tablet TAKE 1 TABLET BY MOUTH EVERY DAY 90 tablet 3  . lisinopril (ZESTRIL) 20 MG tablet Take 20 mg by mouth daily.    . metoprolol tartrate (LOPRESSOR) 50 MG tablet TAKE 1 TABLET BY MOUTH TWICE A DAY 180 tablet 3  . mycophenolate (CELLCEPT) 250 MG capsule Take 1,000 mg by mouth every 12 (twelve) hours.     Marland Kitchen NEEDLE, DISP, 18 G (BD HYPODERMIC NEEDLE) 18G X 1" MISC Use 2 needles once weekly for Testosterone medication. Use 1  needle to draw up medication and 2'nd needle to inject into muscle. 200 each 1  . predniSONE (DELTASONE) 5 MG tablet Take 5 mg by mouth daily with breakfast.    . rosuvastatin (CRESTOR) 20 MG tablet Take 20 mg by mouth at bedtime.    . senna-docusate (SENOKOT-S) 8.6-50 MG tablet Take 1 tablet by mouth as needed for mild constipation.    . Syringe, Disposable, (SYRINGE LUER LOCK) 3 ML MISC Use 1 syringe once weekly to inject Testosterone medication. 100 each 1  . tamsulosin (FLOMAX) 0.4 MG CAPS capsule Take 0.8 mg by mouth at bedtime.     Marland Kitchen testosterone cypionate (DEPOTESTOSTERONE CYPIONATE) 200 MG/ML injection Inject 0.6 mLs (120 mg total) into the muscle every 7 (seven) days. 10 mL 1  . traMADol (ULTRAM) 50 MG tablet Take 1 tablet (50 mg total) by mouth every 6 (six) hours as needed. 15 tablet 0  . traZODone (DESYREL) 50 MG tablet Take 50-100 mg by mouth at bedtime as needed for sleep.     Marland Kitchen UNABLE TO FIND BiPAP: At bedtime    . zolpidem (AMBIEN) 10 MG tablet Take 10-20 mg by mouth at bedtime as needed for sleep.     Marland Kitchen amLODipine (NORVASC) 10 MG tablet Take 1 tablet (10 mg total) by mouth daily. 90 tablet 3   No current facility-administered medications for this visit.   Social history is notable in that he is married for over 30 years. His 3 children. He has a degree in chemistry. 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 He does drink occasional alcohol. He has smoked cigars.  ROS General: Negative; No fevers, chills, or night sweats; positive for morbid obesity HEENT: Negative; No changes in vision or hearing, sinus congestion, difficulty swallowing Pulmonary: Negative; No cough, wheezing, shortness of breath, hemoptysis Cardiovascular: See HPI GI: Negative; No nausea, vomiting, diarrhea, or abdominal pain GU: Negative; No dysuria, hematuria, or difficulty voiding Renal: no longer on dialysis, status post renal transplant Musculoskeletal: Negative; no myalgias, joint pain, or  weakness Hematologic/Oncology: Negative; no easy bruising, bleeding Endocrine: Positive for diabetes and hypothyroidism Neuro: Negative; no changes in balance, headaches Skin: Negative; No rashes or skin lesions Psychiatric: Negative;  No behavioral problems, depression Sleep: Positive for OSA on BiPAP; no daytime sleepiness, hypersomnolence, bruxism, restless legs, hypnogognic hallucinations, no cataplexy Other comprehensive 14 point system review is negative.  PE BP 132/70   Pulse 76   Ht 5' 11" (1.803 m)   Wt 296 lb 6.4 oz (134.4 kg)   SpO2 98%   BMI 41.34 kg/m    Left arm blood pressure 136/70;  he has a right upper arm fistula.  Wt Readings from Last 3 Encounters:  12/01/20 296 lb 6.4 oz (134.4 kg)  11/02/20 291 lb 6.4 oz (132.2 kg)  09/14/20 296 lb (134.3 kg)   General: Alert, oriented, no distress.  Skin: normal turgor, no rashes, warm and dry HEENT: Normocephalic, atraumatic. Pupils equal round and reactive to light; sclera anicteric; extraocular muscles intact;  Nose without nasal septal hypertrophy Mouth/Parynx benign; Mallinpatti scale 3 Neck: Thick neck; no JVD, no carotid bruits; normal carotid upstroke Lungs: clear to ausculatation and percussion; no wheezing or rales Chest wall: without tenderness to palpitation Heart: PMI not displaced, RRR, s1 s2 normal, 2/6 mid peaking systolic murmur, no diastolic murmur, no rubs, gallops, thrills, or heaves Abdomen: soft, nontender; no hepatosplenomehaly, BS+; abdominal aorta nontender and not dilated by palpation. Back: no CVA tenderness Pulses 2+ Musculoskeletal: full range of motion, normal strength, no joint deformities Extremities: no clubbing cyanosis or edema, Homan's sign negative  Neurologic: grossly nonfocal; Cranial nerves grossly wnl Psychologic: Normal mood and affect   ECG (independently read by me): NSR at 70; QS anteroseptally, inferolateral T wave abnormality  September 14, 2020 ECG (independently read by  me): Normal sinus rhythm at 73 bpm.  Poor anterior R wave progression V1 through V3.  Mild ST changes laterally.  Normal intervals.  No ectopy.  November 20, 2019 ECG (independently read by me): Normal sinus rhythm at 68 bpm.  Q-wave lead III.  Mild inferior T wave abnormality.  Normal intervals.  QTc interval 440 ms.  No ectopy  May 2019 ECG (independently read by me): NSR at 64; normal intervals.  No ectopy  November 2018 ECG (independently read by me): normal sinus rhythm at 67 bpm.  Normal intervals.  No ectopy.  September 2018 ECG (independently read by me): normal sinus rhythm at 71 bpm.  No ST segment changes.  Normal intervals.  May 2018 ECG (independently read by me): Normal sinus rhythm at 89 bpm.  No STT changes .  PR interval 126 ms.  QTc interval 457 ms. Normal ECG.  October 2017 ECG (independently read by me): Sinus tachycardia 101 bpm.  April 2017 ECG (independently read by me): Sinus tachycardia 100 bpm.  2.  TC interval 45 ms.  November 2014 ECG: Normal sinus rhythm;  PR interval 138 ms. QT interval 448 ms.  LABS: BMP Latest Ref Rng & Units 10/27/2020 08/31/2020 05/31/2020  Glucose 70 - 99 mg/dL 111(H) 103(H) 283(H)  BUN 6 - 23 mg/dL 19 28(H) 24(H)  Creatinine 0.40 - 1.50 mg/dL 1.89(H) 1.73(H) 1.76(H)  BUN/Creat Ratio 9 - 20 - - -  Sodium 135 - 145 mEq/L 139 143 136  Potassium 3.5 - 5.1 mEq/L 4.5 4.0 4.8  Chloride 96 - 112 mEq/L 105 105 102  CO2 19 - 32 mEq/L _0 Calcium 8.4 - 10.5 mg/dL 10.3 10.4 10.0   Hepatic Function Latest Ref Rng & Units 05/31/2020 03/03/2020 02/12/2020  Total Protein 6.0 - 8.3 g/dL 6.3 5.9(L) 6.5  Albumin 3.5 - 5.2 g/dL 4.1 3.8 4.4  AST 0 -  37 U/L _0 ALT 0 - 53 U/L _1 Alk Phosphatase 39 - 117 U/L 67 73 87  Total Bilirubin 0.2 - 1.2 mg/dL 1.0 0.9 0.8    CBC Latest Ref Rng & Units 05/31/2020 03/03/2020 02/12/2020  WBC 4.0 - 10.5 K/uL 8.8 9.6 7.0  Hemoglobin 13.0 - 17.0 g/dL 13.8 14.2 14.0  Hematocrit 39.0 - 52.0 % 41.6 44.4  42.2  Platelets 150.0 - 400.0 K/uL 267.0 216 244.0    Lab Results  Component Value Date   MCV 85.1 05/31/2020   MCV 86.4 03/03/2020   MCV 85.1 02/12/2020   Lab Results  Component Value Date   TSH 1.25 08/31/2020   Lab Results  Component Value Date   HGBA1C 6.9 (H) 10/27/2020   Lipid Panel     Component Value Date/Time   CHOL 147 08/31/2020 1013   TRIG 97.0 08/31/2020 1013   HDL 53.40 08/31/2020 1013   CHOLHDL 3 08/31/2020 1013   VLDL 19.4 08/31/2020 1013   LDLCALC 75 08/31/2020 1013    RADIOLOGY: No results found.  IMPRESSION:  1. Aortic valve stenosis, etiology of cardiac valve disease unspecified   2. Essential hypertension   3. OSA on BiPAP   4. Hyperlipidemia with target LDL less than 70   5. Hypothyroidism, unspecified type   6. Morbid obesity ([place])   7. History of renal transplantation   8. Stage 3b chronic kidney disease ([place])     ASSESSMENT AND PLAN: Mr. Schoneman is a 60 year old Caucasian male who has a history of morbid obesity, hypertension, diabetes mellitus, obstructive sleep apnea on BiPAP, hyperlipidemia, and end-stage kidney disease.  He is status post remote nephrectomy in1974 and on 02/25/2017 after having been on hemodialysis he underwent successful renal transplantation from a living donor done at [facility].  He developed a postoperative inguinal hernia, which subsequently was repaired.  There also was a history of possible diverticular bleeding.  He has a history of SVT, but he has not had any recurrence.  He is followed closely by the transplantation team at [facility].  I reviewed recent [place] records.  On exam he has a murmur of aortic stenosis.  An echo Doppler study in March 2021 showed an EF at 65 to 01%, grade 1 diastolic dysfunction.  He had moderate to severe aortic valve stenosis with a mean gradient of 39 and peak gradient of 58.7.  Aortic valve area is 1.18 cm.  His blood pressure when seen in December 2021 was excellent at  he was on amlodipine 10 mg, furosemide as needed for swelling, lisinopril 20 mg twice a day, metoprolol tartrate 50 mg twice a day.  He is on rosuvastatin 20 mg daily for hyperlipidemia.  LDL cholesterol on November 24 was 75.  He is diabetic on insulin.  He continues to be on levothyroxine for hypothyroidism.  He is now on Nulojix in addition to CellCept for his immunosuppressive therapy.  I reviewed his most recent echo Doppler study from February 2022 which essentially is unchanged.  He continues to have hyperdynamic LV function with EF 65 to 70%, moderate LVH, grade 2 diastolic dysfunction, and on her most recent echo aortic mean gradient was 34.6 with peak gradient of 57 and calculated valve area of 1.05 cm.  He does admit to some exertional shortness of breath with activity.  He also has noticed increasing leg swelling.  His most recent creatinine is further increased to 1.89.  I have suggested slight  reduction in his lisinopril from 20 mg twice a day down to just 20 mg daily.  I have also recommended he increase his Lasix to 20 mg daily from every other day.  I also have recommended that he wear support stockings with 20 to 30 mm of support to aid with his lower extremity edema.  He is scheduled to see [facility] over the next 6 weeks.  I discussed with him potential need for future structural team evaluation for his aortic valve disease and possible need for AVR.  He will have a follow-up echo Doppler study in July and I will see him for follow-up evaluation.  He continues to use BiPAP with excellent compliance.  AHI is stable at 2.3 on his 18/14 pressure.  There are days with some increased mask leak.  He continues to be on insulin for his diabetes.  He is on rosuvastatin 20 mg for hyperlipidemia with target LDL less than 70.  He continues to be on levothyroxine 100 mcg for hypothyroidism.  He is morbidly obese with a BMI of 41.34 weight loss was recommended.   Troy Sine, MD, [facility]   12/03/2020 1:04 PM

## 2020-12-02 ENCOUNTER — Telehealth: Payer: Self-pay | Admitting: Cardiovascular Disease

## 2020-12-02 NOTE — Telephone Encounter (Signed)
Spoke with patient regarding 05/02/21 10:35 am Echo appointment at [address] and the 05/16/21 10:00 am follow up with Dr. Everlena Cooper voiced his understanding and I will mail information to patient

## 2020-12-03 ENCOUNTER — Encounter: Payer: Self-pay | Admitting: Cardiovascular Disease

## 2020-12-12 ENCOUNTER — Other Ambulatory Visit: Payer: Self-pay | Admitting: Endocrinology

## 2020-12-13 ENCOUNTER — Other Ambulatory Visit: Payer: Self-pay

## 2020-12-13 ENCOUNTER — Other Ambulatory Visit (INDEPENDENT_AMBULATORY_CARE_PROVIDER_SITE_OTHER): Payer: 59

## 2020-12-13 DIAGNOSIS — E291 Testicular hypofunction: Secondary | ICD-10-CM

## 2020-12-13 DIAGNOSIS — Z794 Long term (current) use of insulin: Secondary | ICD-10-CM | POA: Diagnosis not present

## 2020-12-13 DIAGNOSIS — E1165 Type 2 diabetes mellitus with hyperglycemia: Secondary | ICD-10-CM | POA: Diagnosis not present

## 2020-12-13 LAB — CBC
HCT: 42.7 % (ref 39.0–52.0)
Hemoglobin: 14.2 g/dL (ref 13.0–17.0)
MCHC: 33.2 g/dL (ref 30.0–36.0)
MCV: 83.9 fl (ref 78.0–100.0)
Platelets: 232 10*3/uL (ref 150.0–400.0)
RBC: 5.09 Mil/uL (ref 4.22–5.81)
RDW: 16.8 % — ABNORMAL HIGH (ref 11.5–15.5)
WBC: 7.6 10*3/uL (ref 4.0–10.5)

## 2020-12-13 LAB — BASIC METABOLIC PANEL
BUN: 21 mg/dL (ref 6–23)
CO2: 26 mEq/L (ref 19–32)
Calcium: 9.4 mg/dL (ref 8.4–10.5)
Chloride: 104 mEq/L (ref 96–112)
Creatinine, Ser: 1.69 mg/dL — ABNORMAL HIGH (ref 0.40–1.50)
GFR: 43.26 mL/min — ABNORMAL LOW (ref >60.00)
Glucose, Bld: 240 mg/dL — ABNORMAL HIGH (ref 70–99)
Potassium: 4.4 mEq/L (ref 3.5–5.1)
Sodium: 138 mEq/L (ref 135–145)

## 2020-12-13 LAB — TSH: TSH: 1.11 u[IU]/mL (ref 0.35–4.50)

## 2020-12-13 LAB — TESTOSTERONE: Testosterone: 643.62 ng/dL (ref 300.00–890.00)

## 2020-12-13 LAB — T4, FREE: Free T4: 0.87 ng/dL (ref 0.60–1.60)

## 2020-12-14 ENCOUNTER — Encounter: Payer: Self-pay | Admitting: Endocrinology

## 2020-12-14 ENCOUNTER — Ambulatory Visit: Payer: 59 | Admitting: Endocrinology

## 2020-12-14 VITALS — BP 142/78 | HR 80 | Ht 70.0 in | Wt 298.0 lb

## 2020-12-14 DIAGNOSIS — Z794 Long term (current) use of insulin: Secondary | ICD-10-CM

## 2020-12-14 DIAGNOSIS — E291 Testicular hypofunction: Secondary | ICD-10-CM

## 2020-12-14 DIAGNOSIS — E1165 Type 2 diabetes mellitus with hyperglycemia: Secondary | ICD-10-CM

## 2020-12-14 DIAGNOSIS — E039 Hypothyroidism, unspecified: Secondary | ICD-10-CM | POA: Diagnosis not present

## 2020-12-14 LAB — FRUCTOSAMINE: Fructosamine: 276 umol/L (ref 0–285)

## 2020-12-14 NOTE — Patient Instructions (Addendum)
U-500 in syringe use 8-10 U before meals and 4 at bedtime  Check blood sugars on waking up 7 days a week  Also check blood sugars about 2 hours after meals and do this after different meals by rotation  Recommended blood sugar levels on waking up are 90-130 and about 2 hours after meal is 130-160  Please bring your blood sugar monitor to each visit, thank you  T injections 0.6 ml not 0.7

## 2020-12-14 NOTE — Progress Notes (Signed)
Patient ID: Robert [name], male   DOB: 07/11/1961, 60 y.o.   MRN: [id number]     Reason for Appointment: Endocrinology follow-up  History of Present Illness   DIABETES diagnosis date:  2012  Previous history: His blood sugar has been difficult to control since onset. Initially was taking oral hypoglycemic drugs like glyburide but could not take metformin because of renal dysfunction. Since Victoza did not help his control he was started on Lantus and then U 500 insulin also His A1c has previously ranged from 7.8-9.8, the lowest level in 03/2013 He was on Victoza but because of his markedly decreased appetite and weight loss this was stopped in 9/15  Recent history:   Non-hypoglycemic insulin regimen: was on Victoza 1.8 mg daily  His A1c is last 6.9 Fructosamine was 276  OMNIPOD pump settings with U-500 insulin:  Basal rate 12 AM = 0.4, 5 AM 12 noon = 0.7, 12 PM-10 PM = = 0.7 and 10 PM = 0.5 Carbohydrate ratio 1:20, correction factor I: 60 Bolus insulin with NovoLog FlexPen variable doses  Current blood sugar patterns, management and problems identified:    He has very infrequent monitoring with his fingersticks and difficult to get a consistent pattern  However his blood sugars on an average are much higher than the last visit  He will have frequent high sugars in the morning but not consistently  His blood sugars late afternoon and evenings are appearing to be more consistently higher  Again he admits that he is not taking his mealtime insulin before starting the meal frequently  Currently using the old OmniPod system and he does not think he can continue this much longer because of high cost of the pods  Yesterday he had cheesecake with lunch and did not cover this with extra insulin but when the blood sugar was 298 postprandially he took additional 25 units NovoLog which caused him to have a low sugar reaction  His early morning basal rates were reduced on  the last visit because of some low sugars during the morning hours  He is not able to exercise much and weight is about the same  Also previously had stopped Victoza which he thought was causing constipation   Proper timing of medications in relation to meals: Yes.          Monitors blood glucose:  1-2 times a day .    Glucometer: Accu-Chek         Blood Glucose readings from insulin pump as follows   PRE-MEAL Fasting Lunch Dinner Bedtime Overall  Glucose range:  129-338 ?   92-285   163-301   Mean/median:     209   POST-MEAL PC Breakfast PC Lunch PC Dinner  Glucose range:   93, 298   Mean/median:      PREVIOUSLY:  PRE-MEAL Fasting Lunch Dinner Bedtime Overall  Glucose range:  58-207  56-225  81 -210  218, 220   Mean/median:  117  119  156  168 138     Meals: 2-3 meals per day. 7-8 am and Noon and Dinner 5-7 PM .              Weight control:   Wt Readings from Last 3 Encounters:  12/14/20 298 lb (135.2 kg)  12/01/20 296 lb 6.4 oz (134.4 kg)  11/02/20 291 lb 6.4 oz (891.6 kg)        Complications:   nephropathy, neuropathy   Diabetes labs:  Lab Results  Component Value Date   HGBA1C 6.9 (H) 10/27/2020   HGBA1C 8.6 (H) 05/31/2020   HGBA1C 8.7 (H) 02/12/2020   Lab Results  Component Value Date   MICROALBUR 146.3 (H) 08/03/2019   LDLCALC 75 08/31/2020   CREATININE 1.69 (H) 12/13/2020    Lab Results  Component Value Date   FRUCTOSAMINE 276 12/13/2020   FRUCTOSAMINE 375 (H) 03/31/2020   FRUCTOSAMINE 367 (H) 02/12/2020   Lab Results  Component Value Date   HGB 14.2 12/13/2020     Lab on 12/13/2020  Component Date Value Ref Range Status  . WBC 12/13/2020 7.6  4.0 - 10.5 K/uL Final  . RBC 12/13/2020 5.09  4.22 - 5.81 Mil/uL Final  . Platelets 12/13/2020 232.0  150.0 - 400.0 K/uL Final  . Hemoglobin 12/13/2020 14.2  13.0 - 17.0 g/dL Final  . HCT 12/13/2020 42.7  39.0 - 52.0 % Final  . MCV 12/13/2020 83.9  78.0 - 100.0 fl Final  . MCHC 12/13/2020  33.2  30.0 - 36.0 g/dL Final  . RDW 12/13/2020 16.8* 11.5 - 15.5 % Final  . Testosterone 12/13/2020 643.62  300.00 - 890.00 ng/dL Final  . Sodium 12/13/2020 138  135 - 145 mEq/L Final  . Potassium 12/13/2020 4.4  3.5 - 5.1 mEq/L Final  . Chloride 12/13/2020 104  96 - 112 mEq/L Final  . CO2 12/13/2020 26  19 - 32 mEq/L Final  . Glucose, Bld 12/13/2020 240* 70 - 99 mg/dL Final  . BUN 12/13/2020 21  6 - 23 mg/dL Final  . Creatinine, Ser 12/13/2020 1.69* 0.40 - 1.50 mg/dL Final  . GFR 12/13/2020 43.26* >60.00 mL/min Final   Calculated using the CKD-EPI Creatinine Equation (2021)  . Calcium 12/13/2020 9.4  8.4 - 10.5 mg/dL Final  . Fructosamine 12/13/2020 276  0 - 285 umol/L Final   Comment: Published reference interval for apparently healthy subjects between age 53 and 78 is 77 - 285 umol/L and in a poorly controlled diabetic population is 228 - 563 umol/L with a mean of 396 umol/L.   Marland Kitchen Free T4 12/13/2020 0.87  0.60 - 1.60 ng/dL Final   Comment: Specimens from patients who are undergoing biotin therapy and /or ingesting biotin supplements may contain high levels of biotin.  The higher biotin concentration in these specimens interferes with this Free T4 assay.  Specimens that contain high levels  of biotin may cause false high results for this Free T4 assay.  Please interpret results in light of the total clinical presentation of the patient.    Marland Kitchen TSH 12/13/2020 1.11  0.35 - 4.50 uIU/mL Final    OTHER problems: See review of systems    Allergies as of 12/14/2020   No Known Allergies     Medication List       Accurate as of December 14, 2020  4:25 PM. If you have any questions, ask your nurse or doctor.        Accu-Chek [employer] Kit 1 each by Does not apply route daily.   Accu-Chek FastClix Lancets Misc Use lancets to check blood sugar 5 times daily.   amLODipine 10 MG tablet Commonly known as: NORVASC Take 1 tablet (10 mg total) by mouth daily.   amoxicillin 500 MG  tablet Commonly known as: AMOXIL Take 1,000 mg by mouth 2 (two) times daily. Before dental visits   aspirin EC 81 MG tablet Take 81 mg by mouth daily.   B-D UF III MINI PEN NEEDLES 31G  X 5 MM Misc Generic drug: Insulin Pen Needle USE 4 PER DAY TO INJECT INSULIN.   BD Hypodermic Needle 18G X 1" Misc Generic drug: NEEDLE (DISP) 18 G Use 2 needles once weekly for Testosterone medication. Use 1 needle to draw up medication and 2'nd needle to inject into muscle.   DULoxetine 60 MG capsule Commonly known as: CYMBALTA Take 60 mg by mouth daily.   Fifty50 Glucose Meter 2.0 w/Device Kit Use as directed   Accu-Chek Guide w/Device Kit 1 each by Does not apply route daily. Use meter to check blood sugar 5 times daily.   fluocinonide cream 0.05 % Commonly known as: LIDEX Apply 1 application topically daily as needed (Skin inflammation (hand, thigh, back)).   furosemide 20 MG tablet Commonly known as: LASIX Take 20 mg by mouth daily.   gabapentin 100 MG capsule Commonly known as: NEURONTIN Take 200-300 mg by mouth See admin instructions. 200 mg in the morning and 300 mg at bedtime   glucose blood test strip Commonly known as: Accu-Chek Guide 1 each by Other route as needed for other. Use as instructed to check blood sugar 5 times daily.   HUMULIN R 500 UNIT/ML injection Generic drug: insulin regular human CONCENTRATED USE MAX 200 UNITS IN PUMP EVERY THREE DAYS   Insulin Syringe-Needle U-100 31G X 5/16" 0.3 ML Misc Commonly known as: BD Insulin Syringe U/F Use to inject insulin 2-3 times per day   BD Insulin Syringe U/F 31G X 5/16" 1 ML Misc Generic drug: Insulin Syringe-Needle U-100 USE 3 TIMES A DAY WITH HUMULIN R   lactulose 10 GM/15ML solution Commonly known as: CHRONULAC Take 10 g by mouth daily as needed.   levothyroxine 100 MCG tablet Commonly known as: SYNTHROID TAKE 1 TABLET BY MOUTH EVERY DAY   lisinopril 20 MG tablet Commonly known as: ZESTRIL Take 20 mg by  mouth daily.   metoprolol tartrate 50 MG tablet Commonly known as: LOPRESSOR TAKE 1 TABLET BY MOUTH TWICE A DAY   mycophenolate 250 MG capsule Commonly known as: CELLCEPT Take 1,000 mg by mouth every 12 (twelve) hours.   NovoLOG FlexPen 100 UNIT/ML FlexPen Generic drug: insulin aspart Inject 50 Units into the skin 3 (three) times daily with meals. What changed: additional instructions   Nulojix 250 MG Solr injection Generic drug: belatacept Inject 24.5 mLs into the vein every 28 (twenty-eight) days.   predniSONE 5 MG tablet Commonly known as: DELTASONE Take 5 mg by mouth daily with breakfast.   rosuvastatin 20 MG tablet Commonly known as: CRESTOR Take 20 mg by mouth at bedtime.   senna-docusate 8.6-50 MG tablet Commonly known as: Senokot-S Take 1 tablet by mouth as needed for mild constipation.   Syringe Luer Lock 3 ML Misc Use 1 syringe once weekly to inject Testosterone medication.   tamsulosin 0.4 MG Caps capsule Commonly known as: FLOMAX Take 0.8 mg by mouth at bedtime.   testosterone cypionate 200 MG/ML injection Commonly known as: DEPOTESTOSTERONE CYPIONATE INJECT 0.6 MLS (120 MG TOTAL) INTO THE MUSCLE EVERY 7 (SEVEN) DAYS.   traMADol 50 MG tablet Commonly known as: ULTRAM Take 1 tablet (50 mg total) by mouth every 6 (six) hours as needed.   traZODone 50 MG tablet Commonly known as: DESYREL Take 50-100 mg by mouth at bedtime as needed for sleep.   UNABLE TO FIND BiPAP: At bedtime   zolpidem 10 MG tablet Commonly known as: AMBIEN Take 10-20 mg by mouth at bedtime as needed for sleep.  Allergies: No Known Allergies  Past Medical History:  Diagnosis Date  . Anemia    low hgb at present  . Arthritis    FEET  . Blood transfusion without reported diagnosis 1974   kidney removed - L  . Cancer ([place])    skin ca right shoulder, plastic dsyplasia, pre-Ca polpys removed on Colonoscopy- 07/2014  . Colon polyps 07/23/2014   Tubular adenomas x 5   . Constipation   . Diabetes mellitus without complication (HCC)    Type 2  . Difficult intubation    unsure of actual problem but it was during the January 28, 2015 procedure.  Marland Kitchen Dysrhythmia   . Eczema   . ESRD (end stage renal disease) ([place])    hEMO mwf  . Headache(784.0)    slight dull h/a due kidney failure  . Hyperlipidemia   . Hypertension    SVT h/o , followed by Dr. Claiborne Billings  . Hypothyroidism   . Macular degeneration   . Neuropathy    right hand and both feet  . PAT (paroxysmal atrial tachycardia) ([place])   . PSVT (paroxysmal supraventricular tachycardia) ([place])   . SBO (small bowel obstruction) ([place])   . Shortness of breath    with exertion  . Sleep apnea 06/18/11   [facility] BiPAP- 13-16  . Thyroid disease   . Umbilical hernia    will repair with thi surgery    Past Surgical History:  Procedure Laterality Date  . AV FISTULA PLACEMENT Right 02/25/2015   Procedure: RIGHT ARTERIOVENOUS (AV) FISTULA CREATION;  Surgeon: Serafina Mitchell, MD;  Location: [place];  Service: Vascular;  Laterality: Right;  . AV FISTULA PLACEMENT Right 09/15/2015   Procedure: ARTERIOVENOUS (AV) FISTULA CREATION- RIGHT ARM;  Surgeon: Serafina Mitchell, MD;  Location: [place];  Service: Vascular;  Laterality: Right;  . CAPD INSERTION N/A 05/18/2014   Procedure: LAPAROSCOPIC INSERTION CONTINUOUS AMBULATORY PERITONEAL DIALYSIS  (CAPD) CATHETER;  Surgeon: Ralene Ok, MD;  Location: [place];  Service: General;  Laterality: N/A;  . COLONOSCOPY    . COLONOSCOPY WITH PROPOFOL N/A 07/12/2016   Procedure: COLONOSCOPY WITH PROPOFOL;  Surgeon: [name], MD;  Location: [facility];  Service: Endoscopy;  Laterality: N/A;  . declotting of fistula  08/2015  . ESOPHAGOGASTRODUODENOSCOPY (EGD) WITH PROPOFOL N/A 07/12/2016   Procedure: ESOPHAGOGASTRODUODENOSCOPY (EGD) WITH PROPOFOL;  Surgeon: [name], MD;  Location: [facility];  Service: Endoscopy;   Laterality: N/A;  . FISTULA SUPERFICIALIZATION Right 02/07/2016   Procedure: RIIGHT UPPER ARM FISTULA SUPERFICIALIZATION;  Surgeon: Elam Dutch, MD;  Location: [place];  Service: Vascular;  Laterality: Right;  . IJ catheter insertion    . INSERTION OF DIALYSIS CATHETER Right 02/25/2015   Procedure: INSERTION OF RIGHT INTERNAL JUGULAR DIALYSIS CATHETER;  Surgeon: Serafina Mitchell, MD;  Location: [place];  Service: Vascular;  Laterality: Right;  . KIDNEY TRANSPLANT    . LAPAROSCOPIC REPOSITIONING CAPD CATHETER N/A 06/16/2014   Procedure: LAPAROSCOPIC REPOSITIONING CAPD CATHETER;  Surgeon: Ralene Ok, MD;  Location: [place];  Service: General;  Laterality: N/A;  . LAPAROSCOPY N/A 05/04/2015   Procedure: LAPAROSCOPY DIAGNOSTIC LYSIS OF ADHESIONS;  Surgeon: Coralie Keens, MD;  Location: [place];  Service: General;  Laterality: N/A;  . MINOR REMOVAL OF PERITONEAL DIALYSIS CATHETER N/A 04/28/2015   Procedure:  REMOVAL OF PERITONEAL DIALYSIS CATHETER;  Surgeon: Coralie Keens, MD;  Location: [place];  Service: General;  Laterality: N/A;  . NEPHRECTOMY Left 1974  .  RENAL BIOPSY Right 2012  . REVISON OF ARTERIOVENOUS FISTULA Right 05/26/2015   Procedure: SUPERFICIALIZATION OF ARTERIOVENOUS FISTULA WITH SIDE BRANCH LIGATIONS;  Surgeon: Serafina Mitchell, MD;  Location: [place];  Service: Vascular;  Laterality: Right;  . SKIN CANCER EXCISION     right shoulder  . SVT ABLATION N/A 11/23/2016   Procedure: SVT Ablation;  Surgeon: Will Meredith Leeds, MD;  Location: [place] [facility];  Service: Cardiovascular;  Laterality: N/A;  . UMBILICAL HERNIA REPAIR N/A 05/18/2014   Procedure: HERNIA REPAIR UMBILICAL ADULT;  Surgeon: Ralene Ok, MD;  Location: [place];  Service: General;  Laterality: N/A;  . UMBILICAL HERNIA REPAIR N/A 04/28/2015   Procedure: UMBILICAL HERNIA REPAIR WITH MESH;  Surgeon: Coralie Keens, MD;  [facility];  Service: General;  Laterality: N/A;    Family History  Problem Relation Age of  Onset  . Colon polyps Father   . Colon cancer Neg Hx   . Stomach cancer Neg Hx     Social History:  reports that he quit smoking about 8 years ago. His smoking use included cigars. He has never used smokeless tobacco. He reports current alcohol use. He reports that he does not use drugs.  Review of Systems:  Hypertension:    His blood pressure has been managed by his nephrologist and transplant team monitors BP at home also  BP Readings from Last 3 Encounters:  12/14/20 (!) 142/78  12/01/20 132/70  11/02/20 132/88    Lipids:  His LDL is below 100 with 20 mg Crestor, he thinks the dose was increased in 9/21 Has no evidence of vascular disease     Lab Results  Component Value Date   CHOL 147 08/31/2020   CHOL 158 07/07/2019   CHOL 153 04/29/2019   Lab Results  Component Value Date   HDL 53.40 08/31/2020   HDL 47 07/07/2019   HDL 54.30 04/29/2019   Lab Results  Component Value Date   LDLCALC 75 08/31/2020   LDLCALC 92 07/07/2019   LDLCALC 80 04/29/2019   Lab Results  Component Value Date   TRIG 97.0 08/31/2020   TRIG 93 07/07/2019   TRIG 92.0 04/29/2019   Lab Results  Component Value Date   CHOLHDL 3 08/31/2020   CHOLHDL 3 04/29/2019   CHOLHDL 3 07/10/2018   No results found for: LDLDIRECT   HYPOTHYROIDISM: He has had long-standing hypothyroidism  Has been taking his 100 g levothyroxine before breakfast daily  TSH normal  Lab Results  Component Value Date   TSH 1.11 12/13/2020       HYPOGONADISM: 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  When testosterone level was low he was having fatigue and decreased motivation and because of his  he was prescribed He has been using AndroGel since February 2019 but subsequently stopped this because of cost  Current regimen is Depo testosterone 140 mg weekly taking injections on Saturday  He was told to take 120 mg weekly but is still taking 140 mg or 0.7 mm on his  own Testosterone level is higher than usual at 644  Pretreatment free testosterone level low at 3.7     Lab Results  Component Value Date   TESTOSTERONE 643.62 12/13/2020   TESTOSTERONE 366.67 10/27/2020   TESTOSTERONE 267.85 (L) 08/31/2020    He has depression, taking 30 mg of Cymbalta; followed by PCP  Diabetic neuropathy: Has mild sensory loss on his last exam    Examination:   BP Marland Kitchen)  142/78   Pulse 80   Ht $R'5\' 10"'hc$  (1.778 m)   Wt 298 lb (135.2 kg)   SpO2 97%   BMI 42.76 kg/m   Body mass index is 42.76 kg/m.    ASSESSMENT/ PLAN:    Diabetes type 2 with obesity, insulin-requiring:   See history of present illness for detailed discussion of his current management, problems identified and sugar patterns on his meter download  Has been on basal insulin on his OmniPod pump with U-500, Victoza and mealtime insulin using NovoLog FlexPen currently  A1c is last 6.9 Fructosamine 276 However blood sugars at home for the last 30 days are averaging 209  With using the insulin pump and U-500 insulin for his basal his glucose control was improved in January but now his blood sugars are mostly running high Although he has some good readings before breakfast and dinnertime most of his high readings are likely to be from inadequate or missed doses for mealtime coverage with NovoLog injections; also these are not documented  He cannot afford the freestyle libre or any other CGM system again  Recommendations:  No change in pump settings  He needs to start taking his NOVOLOG consistently before meals and adjust the dose based on how much carbohydrate he is planning to eat and especially before eating desserts he will take another 5 to 10 units  Likely needs to take 15 to 20 units for full meals  He should not take more than 10 units for correcting the high sugar after meals  Start checking his blood sugar 3-4 times a day  When he is stopping his insulin pump he will go to the  U-500 insulin at least 8 to 10 units before each meal and adjust the dose further based on his blood sugars  If his morning sugars are consistently high we will start with at least 4 units at bedtime  Consider Ozempic on the next visit if he is able to afford it  Testosterone dosage to be reduced to 0.6 mL instead of 0.7 mL weekly  HYPOTHYROIDISM: TSH is consistently normal with 100 mcg levothyroxine which he will continue   Patient Instructions  U-500 in syringe use 8-10 U before meals and 4 at bedtime  Check blood sugars on waking up 7 days a week  Also check blood sugars about 2 hours after meals and do this after different meals by rotation  Recommended blood sugar levels on waking up are 90-130 and about 2 hours after meal is 130-160  Please bring your blood sugar monitor to each visit, thank you  T injections 0.6 ml not 0.7      Elayne Snare 12/14/2020, 4:25 PM

## 2020-12-19 ENCOUNTER — Telehealth: Payer: Self-pay | Admitting: Endocrinology

## 2020-12-19 NOTE — Telephone Encounter (Signed)
Patient called to advise that he is going to be switching to [facility] as recommended by Dr Dwyane Dee.  Wanted office know because PA has either been received or will be received shortly for the that device

## 2020-12-20 ENCOUNTER — Encounter: Payer: Self-pay | Admitting: *Deleted

## 2020-12-20 NOTE — Telephone Encounter (Signed)
Have not seen any form for PA but we on the look out.

## 2020-12-22 ENCOUNTER — Other Ambulatory Visit: Payer: Self-pay | Admitting: Endocrinology

## 2020-12-29 ENCOUNTER — Other Ambulatory Visit: Payer: 59

## 2021-01-03 ENCOUNTER — Ambulatory Visit: Payer: 59 | Admitting: Endocrinology

## 2021-01-09 ENCOUNTER — Other Ambulatory Visit: Payer: Self-pay | Admitting: Endocrinology

## 2021-01-10 ENCOUNTER — Telehealth: Payer: Self-pay | Admitting: Endocrinology

## 2021-01-10 NOTE — Telephone Encounter (Signed)
Please advise 

## 2021-01-10 NOTE — Telephone Encounter (Signed)
Patient called to advise that pharmacy is sending a new [place] for Omni Pod to [place] today for completion.  Per pharmacy the first CMN was not completed in full.

## 2021-01-10 NOTE — Telephone Encounter (Signed)
Notified pt Re-faxed complete form [employer] [phone].

## 2021-01-13 ENCOUNTER — Telehealth: Payer: Self-pay

## 2021-01-13 ENCOUNTER — Telehealth: Payer: Self-pay | Admitting: Podiatry

## 2021-01-13 NOTE — Telephone Encounter (Signed)
Patient called stating they have left numerous messages for someone to return call regarding bill, patient stated they were highly upset and asked to speak with Practice Administrator so call was transferred

## 2021-01-13 NOTE — Telephone Encounter (Signed)
Spoke with patient regarding his concerns. Pt was screaming and using  inappropriate language. I asked patient to repeat his name and DOB so that I could look of this information in Epic. Pt got extremely upset and stated he was going to speak with Dr. Prudence Davidson. I informed the patient that I would give him a call back with the information. The phone was disconnected. I looked in his chart to see if he had a balance. Pt didn't and has a 30 dollar refund. I called our Billing leader, which is Bonne Dolores to double check and she saw the same thing. I called pt back and LVM with this information.

## 2021-01-19 ENCOUNTER — Other Ambulatory Visit: Payer: Self-pay | Admitting: Endocrinology

## 2021-01-19 ENCOUNTER — Other Ambulatory Visit: Payer: Self-pay | Admitting: *Deleted

## 2021-01-19 ENCOUNTER — Telehealth: Payer: Self-pay | Admitting: Endocrinology

## 2021-01-19 MED ORDER — NOVOLOG FLEXPEN 100 UNIT/ML ~~LOC~~ SOPN: 50.0000 [IU] | PEN_INJECTOR | Freq: Three times a day (TID) | SUBCUTANEOUS | 1 refills | Status: DC

## 2021-01-19 NOTE — Telephone Encounter (Signed)
Sent Rx Novolog flexpen to the pharmacy

## 2021-01-19 NOTE — Telephone Encounter (Signed)
MEDICATION: NOVOLOG FLEXPEN  PHARMACY:  [facility] [address] [facility] [place] CONTA[facility]ED THEIR PHARMACY?  yes  IS THIS A 90 DAY SUPPLY : yes  IS PATIENT OUT OF MEDICATION: yes  IF NOT; HOW MUCH IS LEFT:   LAST APPOINTMENT DATE: @4 /14/2022  NEXT APPOINTMENT DATE:@6 /04/2021  DO WE HAVE YOUR PERMISSION TO LEAVE A DETAILED MESSAGE?: yes - [phone]  OTHER COMMENTS:    **Let patient know to contact pharmacy at the end of the day to make sure medication is ready. **  ** Please notify patient to allow 48-72 hours to process**  **Encourage patient to contact the pharmacy for refills or they can request refills through [facility]**

## 2021-01-20 DIAGNOSIS — Z940 Kidney transplant status: Secondary | ICD-10-CM | POA: Diagnosis not present

## 2021-02-01 ENCOUNTER — Other Ambulatory Visit: Payer: Self-pay

## 2021-02-01 ENCOUNTER — Ambulatory Visit: Payer: 59 | Admitting: Podiatry

## 2021-02-01 DIAGNOSIS — E0843 Diabetes mellitus due to underlying condition with diabetic autonomic (poly)neuropathy: Secondary | ICD-10-CM

## 2021-02-01 DIAGNOSIS — L97512 Non-pressure chronic ulcer of other part of right foot with fat layer exposed: Secondary | ICD-10-CM | POA: Diagnosis not present

## 2021-02-01 MED ORDER — GENTAMICIN SULFATE 0.1 % EX CREA: 1.0000 "application " | TOPICAL_CREAM | Freq: Two times a day (BID) | CUTANEOUS | 1 refills | Status: DC

## 2021-02-01 NOTE — Progress Notes (Signed)
Subjective:  60 y.o. male with PMHx of diabetes mellitus presenting for a new complaint regarding an ulcer to the right foot.  Patient states that he only noticed it this morning.  He is unsure how long he has had it for.  He denies history of injury.  He does admit to occasionally going barefoot.  He presents for further treatment and evaluation   Past Medical History:  Diagnosis Date  . Anemia    low hgb at present  . Arthritis    FEET  . Blood transfusion without reported diagnosis 1974   kidney removed - L  . Cancer ([place])    skin ca right shoulder, plastic dsyplasia, pre-Ca polpys removed on Colonoscopy- 07/2014  . Colon polyps 07/23/2014   Tubular adenomas x 5  . Constipation   . Diabetes mellitus without complication (HCC)    Type 2  . Difficult intubation    unsure of actual problem but it was during the January 28, 2015 procedure.  Marland Kitchen Dysrhythmia   . Eczema   . ESRD (end stage renal disease) ([place])    hEMO mwf  . Headache(784.0)    slight dull h/a due kidney failure  . Hyperlipidemia   . Hypertension    SVT h/o , followed by Dr. Claiborne Billings  . Hypothyroidism   . Macular degeneration   . Neuropathy    right hand and both feet  . PAT (paroxysmal atrial tachycardia) ([place])   . PSVT (paroxysmal supraventricular tachycardia) ([place])   . SBO (small bowel obstruction) ([place])   . Shortness of breath    with exertion  . Sleep apnea 06/18/11   split-night sleep study- [facility]., BiPAP- 13-16  . Thyroid disease   . Umbilical hernia    will repair with thi surgery      Objective/Physical Exam General: The patient is alert and oriented x3 in no acute distress.  Dermatology:  Wound #1 noted to the plantar aspect of the right fourth toe measuring 1.0 x 1.0 x 0.2 cm (LxWxD).   To the noted ulceration(s), there is no eschar. There is a moderate amount of slough, fibrin, and necrotic tissue noted. Granulation tissue and wound base is red. There is a minimal  amount of serosanguineous drainage noted. There is no exposed bone muscle-tendon ligament or joint. There is no malodor. Periwound integrity is intact. Skin is warm, dry and supple bilateral lower extremities.  Vascular: Palpable pedal pulses bilaterally. No edema or erythema noted. Capillary refill within normal limits.  Neurological: Epicritic and protective threshold diminished bilaterally.   Musculoskeletal Exam: Range of motion within normal limits to all pedal and ankle joints bilateral. Muscle strength 5/5 in all groups bilateral.   Assessment: 1.  Ulcer right fourth toe plantar secondary to diabetes mellitus 2. diabetes mellitus w/ peripheral neuropathy   Plan of Care:  1. Patient was evaluated. 2. medically necessary excisional debridement including subcutaneous tissue was performed using a tissue nipper and a chisel blade. Excisional debridement of all the necrotic nonviable tissue down to healthy bleeding viable tissue was performed with post-debridement measurements same as pre-. 3. the wound was cleansed and dry sterile dressing applied. 4.  Prescription for gentamicin cream applied daily  5.  Patient is to return to clinic in 3 weeks   Edrick Kins, DPM [facility]  Dr. Edrick Kins, DPM    [address]  [place], [place] [address]                Office [phone]  Fax [phone]

## 2021-02-06 ENCOUNTER — Telehealth: Payer: Self-pay | Admitting: Endocrinology

## 2021-02-06 NOTE — Telephone Encounter (Signed)
Error

## 2021-02-13 ENCOUNTER — Ambulatory Visit: Payer: 59 | Admitting: Podiatry

## 2021-02-13 ENCOUNTER — Other Ambulatory Visit: Payer: Self-pay

## 2021-02-13 DIAGNOSIS — L97512 Non-pressure chronic ulcer of other part of right foot with fat layer exposed: Secondary | ICD-10-CM

## 2021-02-13 DIAGNOSIS — E0843 Diabetes mellitus due to underlying condition with diabetic autonomic (poly)neuropathy: Secondary | ICD-10-CM

## 2021-02-13 MED ORDER — GENTAMICIN SULFATE 0.1 % EX CREA: 1.0000 "application " | TOPICAL_CREAM | Freq: Two times a day (BID) | CUTANEOUS | 1 refills | Status: DC

## 2021-02-15 ENCOUNTER — Ambulatory Visit: Payer: 59 | Admitting: Podiatry

## 2021-02-15 ENCOUNTER — Other Ambulatory Visit: Payer: Self-pay

## 2021-02-15 ENCOUNTER — Encounter: Payer: Self-pay | Admitting: Podiatry

## 2021-02-15 DIAGNOSIS — B351 Tinea unguium: Secondary | ICD-10-CM | POA: Diagnosis not present

## 2021-02-15 DIAGNOSIS — E1149 Type 2 diabetes mellitus with other diabetic neurological complication: Secondary | ICD-10-CM | POA: Diagnosis not present

## 2021-02-15 DIAGNOSIS — N189 Chronic kidney disease, unspecified: Secondary | ICD-10-CM

## 2021-02-15 DIAGNOSIS — L97512 Non-pressure chronic ulcer of other part of right foot with fat layer exposed: Secondary | ICD-10-CM | POA: Diagnosis not present

## 2021-02-15 DIAGNOSIS — M79675 Pain in left toe(s): Secondary | ICD-10-CM

## 2021-02-15 DIAGNOSIS — M79674 Pain in right toe(s): Secondary | ICD-10-CM

## 2021-02-15 NOTE — Progress Notes (Signed)
This patient returns to my office for at risk foot care.  This patient requires this care by a professional since this patient will be at risk due to having uncontrolled diabetes and kidney transplant.  This patient is unable to cut nails himself since the patient cannot reach his nails.These nails are painful walking and wearing shoes.  Resolved heel fissure.  Ulcer right hallux being treated by Dr.  Amalia Hailey. This patient presents for at risk foot care today.  General Appearance  Alert, conversant and in no acute stress.  Vascular  Dorsalis pedis and posterior tibial  pulses are palpable  bilaterally.  Capillary return is within normal limits  bilaterally. Temperature is within normal limits  bilaterally.  Neurologic  Senn-Weinstein monofilament wire test diminished   bilaterally. Muscle power within normal limits bilaterally.  Nails Thick disfigured discolored nails with subungual debris  from hallux to fifth toes bilaterally. No evidence of bacterial infection or drainage bilaterally.  Orthopedic  No limitations of motion  feet .  No crepitus or effusions noted.  No bony pathology or digital deformities noted. HAV  B/L.  Skin  normotropic skin with no porokeratosis noted bilaterally.  No signs of infections or ulcers noted.   Fissures left heel.  Onychomycosis  Pain in right toes  Pain in left toes  Fissures left heel.  Consent was obtained for treatment procedures.   Mechanical debridement of nails 1-5  bilaterally performed with a nail nipper.  Filed with dremel without incident.     Return office visit  3 months                   Told patient to return for periodic foot care and evaluation due to potential at risk complications.   Gardiner Barefoot DPM

## 2021-02-22 ENCOUNTER — Ambulatory Visit: Payer: 59 | Admitting: Podiatry

## 2021-02-22 ENCOUNTER — Other Ambulatory Visit: Payer: Self-pay | Admitting: Endocrinology

## 2021-03-01 NOTE — Progress Notes (Signed)
Subjective:  60 y.o. male with PMHx of diabetes mellitus presenting to the office today for new complaint regarding an ulcer that is now developed to the patient's right great toe.  Patient states that over the past week he developed blood blister to the plantar aspect of the right great toe.  He states that it popped on Friday.  He presents today for further treatment and evaluation   Past Medical History:  Diagnosis Date  . Anemia    low hgb at present  . Arthritis    FEET  . Blood transfusion without reported diagnosis 1974   kidney removed - L  . Cancer ([place])    skin ca right shoulder, plastic dsyplasia, pre-Ca polpys removed on Colonoscopy- 07/2014  . Colon polyps 07/23/2014   Tubular adenomas x 5  . Constipation   . Diabetes mellitus without complication (HCC)    Type 2  . Difficult intubation    unsure of actual problem but it was during the January 28, 2015 procedure.  Marland Kitchen Dysrhythmia   . Eczema   . ESRD (end stage renal disease) ([place])    hEMO mwf  . Headache(784.0)    slight dull h/a due kidney failure  . Hyperlipidemia   . Hypertension    SVT h/o , followed by Dr. Claiborne Billings  . Hypothyroidism   . Macular degeneration   . Neuropathy    right hand and both feet  . PAT (paroxysmal atrial tachycardia) ([place])   . PSVT (paroxysmal supraventricular tachycardia) ([place])   . SBO (small bowel obstruction) ([place])   . Shortness of breath    with exertion  . Sleep apnea 06/18/11   split-night sleep study- [facility]., BiPAP- 13-16  . Thyroid disease   . Umbilical hernia    will repair with thi surgery      Objective/Physical Exam General: The patient is alert and oriented x3 in no acute distress.  Dermatology:  Wound #1 noted to the plantar aspect of the right great toe measuring approximately 1.5 x 1.5 x 0.3 cm (LxWxD).   To the noted ulceration(s), there is no eschar. There is a moderate amount of slough, fibrin, and necrotic tissue noted. Granulation  tissue and wound base is red. There is a minimal amount of serosanguineous drainage noted. There is no exposed bone muscle-tendon ligament or joint. There is no malodor. Periwound integrity is intact. Skin is warm, dry and supple bilateral lower extremities.  Vascular: Palpable pedal pulses bilaterally. No edema or erythema noted. Capillary refill within normal limits.  Neurological: Epicritic and protective threshold diminished bilaterally.   Musculoskeletal Exam: Range of motion within normal limits to all pedal and ankle joints bilateral. Muscle strength 5/5 in all groups bilateral.   Assessment: 1.  Ulcer right great toe secondary to diabetes mellitus 2. diabetes mellitus w/ peripheral neuropathy 3.  History of ulcer right fourth toe; healed   Plan of Care:  1. Patient was evaluated. 2. medically necessary excisional debridement including subcutaneous tissue was performed using a tissue nipper and a chisel blade. Excisional debridement of all the necrotic nonviable tissue down to healthy bleeding viable tissue was performed with post-debridement measurements same as pre-. 3. the wound was cleansed and dry sterile dressing applied. 4.  Prescription for gentamicin cream.  Apply daily with a Band-Aid  5.  Patient is to return to clinic in 2 weeks.   Edrick Kins, DPM [facility]  Dr. Edrick Kins, DPM  [address], [place] [address]                Office [phone]  Fax [phone]

## 2021-03-08 ENCOUNTER — Ambulatory Visit: Payer: 59 | Admitting: Podiatry

## 2021-03-13 ENCOUNTER — Other Ambulatory Visit: Payer: Self-pay

## 2021-03-13 ENCOUNTER — Ambulatory Visit: Payer: 59 | Admitting: Podiatry

## 2021-03-13 DIAGNOSIS — E0843 Diabetes mellitus due to underlying condition with diabetic autonomic (poly)neuropathy: Secondary | ICD-10-CM | POA: Diagnosis not present

## 2021-03-13 DIAGNOSIS — L97512 Non-pressure chronic ulcer of other part of right foot with fat layer exposed: Secondary | ICD-10-CM

## 2021-03-13 NOTE — Progress Notes (Signed)
   Subjective:  60 y.o. male with PMHx of diabetes mellitus presenting to the office today for follow-up evaluation of an ulcer to the right great toe.  Patient states he is doing much better.  He has been applying the antibiotic cream and he believes it is mostly healed.  He presents for further treatment and evaluation   Past Medical History:  Diagnosis Date  . Anemia    low hgb at present  . Arthritis    FEET  . Blood transfusion without reported diagnosis 1974   kidney removed - L  . Cancer ([place])    skin ca right shoulder, plastic dsyplasia, pre-Ca polpys removed on Colonoscopy- 07/2014  . Colon polyps 07/23/2014   Tubular adenomas x 5  . Constipation   . Diabetes mellitus without complication (HCC)    Type 2  . Difficult intubation    unsure of actual problem but it was during the January 28, 2015 procedure.  Marland Kitchen Dysrhythmia   . Eczema   . ESRD (end stage renal disease) ([place])    hEMO mwf  . Headache(784.0)    slight dull h/a due kidney failure  . Hyperlipidemia   . Hypertension    SVT h/o , followed by Dr. Claiborne Billings  . Hypothyroidism   . Macular degeneration   . Neuropathy    right hand and both feet  . PAT (paroxysmal atrial tachycardia) ([place])   . PSVT (paroxysmal supraventricular tachycardia) ([place])   . SBO (small bowel obstruction) ([place])   . Shortness of breath    with exertion  . Sleep apnea 06/18/11   split-night sleep study- [facility]., BiPAP- 13-16  . Thyroid disease   . Umbilical hernia    will repair with thi surgery      Objective/Physical Exam General: The patient is alert and oriented x3 in no acute distress.  Dermatology:  Wound #1 noted to the plantar aspect of the right great toe has healed.  Complete reepithelialization has occurred.  No open wound noted.  No drainage noted. Periwound integrity is intact. Skin is warm, dry and supple bilateral lower extremities.  Vascular: Palpable pedal pulses bilaterally. No edema or  erythema noted. Capillary refill within normal limits.  Neurological: Epicritic and protective threshold diminished bilaterally.   Musculoskeletal Exam: Range of motion within normal limits to all pedal and ankle joints bilateral. Muscle strength 5/5 in all groups bilateral.   Assessment: 1.  Ulcer right great toe secondary to diabetes mellitus; resolved 2. diabetes mellitus w/ peripheral neuropathy 3.  History of ulcer right fourth toe; healed   Plan of Care:  1. Patient was evaluated. 2.  Light debridement performed using a tissue nipper without incident or bleeding 3.  Continue wearing good diabetic shoes and insoles 4.  Return to clinic for routine foot care on neck scheduled appointment with Dr. Alcide Evener, DPM [facility]  Dr. Edrick Kins, DPM    [address], [place] [address]                Office [phone]  Fax [phone]

## 2021-03-14 ENCOUNTER — Other Ambulatory Visit: Payer: 59

## 2021-03-15 ENCOUNTER — Ambulatory Visit: Payer: 59 | Admitting: Podiatry

## 2021-03-16 ENCOUNTER — Telehealth: Payer: Self-pay | Admitting: Endocrinology

## 2021-03-16 NOTE — Telephone Encounter (Signed)
Pt called to let us know [facility] [place] [facility] today to refill his Lisinopril and he just wanted to give [place] a heads up since he is about to be out of the medication.

## 2021-03-17 ENCOUNTER — Ambulatory Visit: Payer: 59 | Admitting: Endocrinology

## 2021-03-17 NOTE — Telephone Encounter (Signed)
Please advise. I don't see where it was prescribed at this office

## 2021-03-21 NOTE — Telephone Encounter (Signed)
Called pt to inform of Dr. Ronnie Derby previous note. Pt understands.

## 2021-03-23 ENCOUNTER — Other Ambulatory Visit: Payer: Self-pay

## 2021-03-23 ENCOUNTER — Other Ambulatory Visit (INDEPENDENT_AMBULATORY_CARE_PROVIDER_SITE_OTHER): Payer: 59

## 2021-03-23 DIAGNOSIS — E1165 Type 2 diabetes mellitus with hyperglycemia: Secondary | ICD-10-CM | POA: Diagnosis not present

## 2021-03-23 DIAGNOSIS — Z794 Long term (current) use of insulin: Secondary | ICD-10-CM

## 2021-03-23 DIAGNOSIS — E291 Testicular hypofunction: Secondary | ICD-10-CM

## 2021-03-23 LAB — TESTOSTERONE: Testosterone: 488.06 ng/dL (ref 300.00–890.00)

## 2021-03-23 LAB — BASIC METABOLIC PANEL
BUN: 27 mg/dL — ABNORMAL HIGH (ref 6–23)
CO2: 25 mEq/L (ref 19–32)
Calcium: 9.9 mg/dL (ref 8.4–10.5)
Chloride: 105 mEq/L (ref 96–112)
Creatinine, Ser: 1.77 mg/dL — ABNORMAL HIGH (ref 0.40–1.50)
GFR: 40.84 mL/min — ABNORMAL LOW (ref >60.00)
Glucose, Bld: 131 mg/dL — ABNORMAL HIGH (ref 70–99)
Potassium: 3.9 mEq/L (ref 3.5–5.1)
Sodium: 138 mEq/L (ref 135–145)

## 2021-03-23 LAB — HEMOGLOBIN A1C: Hgb A1c MFr Bld: 7.5 % — ABNORMAL HIGH (ref 4.6–6.5)

## 2021-03-28 ENCOUNTER — Ambulatory Visit (INDEPENDENT_AMBULATORY_CARE_PROVIDER_SITE_OTHER): Payer: 59 | Admitting: Endocrinology

## 2021-03-28 ENCOUNTER — Other Ambulatory Visit: Payer: Self-pay

## 2021-03-28 VITALS — BP 148/94 | HR 76 | Ht 71.0 in | Wt 299.6 lb

## 2021-03-28 DIAGNOSIS — E291 Testicular hypofunction: Secondary | ICD-10-CM

## 2021-03-28 DIAGNOSIS — E782 Mixed hyperlipidemia: Secondary | ICD-10-CM | POA: Diagnosis not present

## 2021-03-28 DIAGNOSIS — E039 Hypothyroidism, unspecified: Secondary | ICD-10-CM

## 2021-03-28 DIAGNOSIS — Z794 Long term (current) use of insulin: Secondary | ICD-10-CM

## 2021-03-28 DIAGNOSIS — E1165 Type 2 diabetes mellitus with hyperglycemia: Secondary | ICD-10-CM

## 2021-03-28 MED ORDER — OZEMPIC (0.25 OR 0.5 MG/DOSE) 2 MG/1.5ML ~~LOC~~ SOPN: 0.5000 mg | PEN_INJECTOR | SUBCUTANEOUS | 1 refills | Status: DC

## 2021-03-28 MED ORDER — FREESTYLE LIBRE 2 SENSOR MISC: 2.0000 | 3 refills | Status: DC

## 2021-03-28 NOTE — Patient Instructions (Addendum)
Novolog with every meal based on Carbs  Start OZEMPIC injections by dialing 0.25 mg on the pen as shown once weekly on the same day of the week.   You may inject in the sides of the stomach, outer thigh or arm as indicated in the brochure given. If you have any difficulties using the pen see the video at [email or web address].[email or web address]  You will feel fullness of the stomach with starting the medication and should try to keep the portions at meals small.  You may experience nausea in the first few days which usually gets better over time    After 4 weeks increase the dose to 0.5 mg weekly  If you have any questions or persistent side effects please call the office   You may also talk to a nurse educator with [employer] at [phone] Useful website: [email or web address]

## 2021-03-28 NOTE — Progress Notes (Signed)
Patient ID: Robert [name], male   DOB: Oct 31, 1960, 60 y.o.   MRN: [id number]     Reason for Appointment: Endocrinology follow-up  History of Present Illness   DIABETES diagnosis date:  2012  Previous history: His blood sugar has been difficult to control since onset. Initially was taking oral hypoglycemic drugs like glyburide but could not take metformin because of renal dysfunction. Since Victoza did not help his control he was started on Lantus and then U 500 insulin also His A1c has previously ranged from 7.8-9.8, the lowest level in 03/2013 He was on Victoza but because of his markedly decreased appetite and weight loss this was stopped in 9/15  Recent history:   Non-hypoglycemic insulin regimen: was on Victoza 1.8 mg daily  His A1c is 7.5 compared to 6.9, previously fructosamine 276  OMNIPOD pump settings with U-500 insulin:  Basal rate 12 AM = 0.3, 5 AM 12 noon = 0.7, 12 PM-10 PM = = 0.7 and 10 PM = 0.5  Carbohydrate ratio 1:10, correction factor I: 60 Bolus insulin with NovoLog FlexPen variable doses  Current blood sugar patterns, management and problems identified:   He has very intermittent monitoring with his Accu-Chek meter As before there is no patterns of his blood sugars Most of his monitoring is in the mornings  He says that he was having some blood sugars overnight and reduced his midnight basal rate by 0.1 His blood sugars on an average are being checked only about once a day but sometimes he will go a whole week or so before starting to check again much higher than the last visit He is interested in the continuous glucose monitoring but wants to use Dexcom, however this is unlikely to be covered. He is not always taking his MEALTIME insulin as he tends to forget, highest blood sugar after lunch was 397 Unclear what regimen he is using for his NOVOLOG as he says that he is mostly adjusting it based on his blood sugar level which has not been checked  regularly Also may be arbitrarily taking some doses He takes up to 20 units before meals Since he is not doing pre and postprandial blood sugars not clear of the adequacy of his doses HYPOGLYCEMIA have been infrequent with a few 60s sporadically in the morning and suppertime previously had stopped Victoza which he thought was causing constipation   Proper timing of medications in relation to meals: Yes.          Monitors blood glucose:  1-2 times a day .    Glucometer: Accu-Chek         Blood Glucose readings from insulin pump as follows   PRE-MEAL Morning Afternoon Evening Bedtime Overall  Glucose range: 69-279    61-397  Mean/median: 174 98 198  Median 152    Previously:   PRE-MEAL Fasting Lunch Dinner Bedtime Overall  Glucose range:  129-338 ?   92-285   163-301   Mean/median:     209   POST-MEAL PC Breakfast PC Lunch PC Dinner  Glucose range:   93, 298   Mean/median:        Meals: 2-3 meals per day. 7-8 am and Noon and Dinner 5-7 PM .              Weight control:   Wt Readings from Last 3 Encounters:  03/28/21 299 lb 9.6 oz (135.9 kg)  12/14/20 298 lb (135.2 kg)  12/01/20 296 lb 6.4 oz (134.4 kg)  Complications:   nephropathy, neuropathy   Diabetes labs:   Lab Results  Component Value Date   HGBA1C 7.5 (H) 03/23/2021   HGBA1C 6.9 (H) 10/27/2020   HGBA1C 8.6 (H) 05/31/2020   Lab Results  Component Value Date   MICROALBUR 146.3 (H) 08/03/2019   LDLCALC 75 08/31/2020   CREATININE 1.77 (H) 03/23/2021    Lab Results  Component Value Date   FRUCTOSAMINE 276 12/13/2020   FRUCTOSAMINE 375 (H) 03/31/2020   FRUCTOSAMINE 367 (H) 02/12/2020   Lab Results  Component Value Date   HGB 14.2 12/13/2020     Lab on 03/23/2021  Component Date Value Ref Range Status   Testosterone 03/23/2021 488.06  300.00 - 890.00 ng/dL Final   Sodium 03/23/2021 138  135 - 145 mEq/L Final   Potassium 03/23/2021 3.9  3.5 - 5.1 mEq/L Final   Chloride 03/23/2021 105  96  - 112 mEq/L Final   CO2 03/23/2021 25  19 - 32 mEq/L Final   Glucose, Bld 03/23/2021 131 (A) 70 - 99 mg/dL Final   BUN 03/23/2021 27 (A) 6 - 23 mg/dL Final   Creatinine, Ser 03/23/2021 1.77 (A) 0.40 - 1.50 mg/dL Final   GFR 03/23/2021 40.84 (A) >60.00 mL/min Final   Calculated using the CKD-EPI Creatinine Equation (2021)   Calcium 03/23/2021 9.9  8.4 - 10.5 mg/dL Final   Hgb A1c MFr Bld 03/23/2021 7.5 (A) 4.6 - 6.5 % Final   Glycemic Control Guidelines for People with Diabetes:Non Diabetic:  <6%Goal of Therapy: <7%Additional Action Suggested:  >8%     OTHER problems: See review of systems    Allergies as of 03/28/2021   No Known Allergies      Medication List        Accurate as of March 28, 2021  9:50 AM. If you have any questions, ask your nurse or doctor.          Accu-Chek [employer] Kit 1 each by Does not apply route daily.   Accu-Chek FastClix Lancets Misc Use lancets to check blood sugar 5 times daily.   amLODipine 10 MG tablet Commonly known as: NORVASC Take 1 tablet (10 mg total) by mouth daily.   amoxicillin 500 MG tablet Commonly known as: AMOXIL Take 1,000 mg by mouth 2 (two) times daily. Before dental visits   aspirin EC 81 MG tablet Take 81 mg by mouth daily.   B-D DISP NEEDLE 25GX1" 25G X 1" Misc Generic drug: NEEDLE (DISP) 25 G USE TO INJECT TESTOSTERONE (EXCHANGE NEEDLE FROM 22G 3ML SYRINGE)   B-D UF III MINI PEN NEEDLES 31G X 5 MM Misc Generic drug: Insulin Pen Needle USE 4 PER DAY TO INJECT INSULIN.   DULoxetine 60 MG capsule Commonly known as: CYMBALTA Take 60 mg by mouth daily.   Fifty50 Glucose Meter 2.0 w/Device Kit Use as directed   Accu-Chek Guide w/Device Kit 1 each by Does not apply route daily. Use meter to check blood sugar 5 times daily.   fluocinonide cream 0.05 % Commonly known as: LIDEX Apply 1 application topically daily as needed (Skin inflammation (hand, thigh, back)).   furosemide 20 MG tablet Commonly known  as: LASIX Take 20 mg by mouth daily.   gabapentin 100 MG capsule Commonly known as: NEURONTIN Take 200-300 mg by mouth See admin instructions. 200 mg in the morning and 300 mg at bedtime   gentamicin cream 0.1 % Commonly known as: GARAMYCIN Apply 1 application topically 2 (two) times daily.   glucose  blood test strip Commonly known as: Accu-Chek Guide 1 each by Other route as needed for other. Use as instructed to check blood sugar 5 times daily.   HUMULIN R 500 UNIT/ML injection Generic drug: insulin regular human CONCENTRATED USE MAX 200 UNITS IN PUMP EVERY THREE DAYS   Insulin Syringe-Needle U-100 31G X 5/16" 0.3 ML Misc Commonly known as: BD Insulin Syringe U/F Use to inject insulin 2-3 times per day   BD Insulin Syringe U/F 31G X 5/16" 1 ML Misc Generic drug: Insulin Syringe-Needle U-100 USE 3 TIMES A DAY WITH HUMULIN R   lactulose 10 GM/15ML solution Commonly known as: CHRONULAC Take 10 g by mouth daily as needed.   levothyroxine 100 MCG tablet Commonly known as: SYNTHROID TAKE 1 TABLET BY MOUTH EVERY DAY   lisinopril 20 MG tablet Commonly known as: ZESTRIL Take 20 mg by mouth daily.   metoprolol tartrate 50 MG tablet Commonly known as: LOPRESSOR TAKE 1 TABLET BY MOUTH TWICE A DAY   mycophenolate 250 MG capsule Commonly known as: CELLCEPT Take 1,000 mg by mouth every 12 (twelve) hours.   NovoLOG FlexPen 100 UNIT/ML FlexPen Generic drug: insulin aspart Inject 50 Units into the skin 3 (three) times daily with meals.   Nulojix 250 MG Solr injection Generic drug: belatacept Inject 24.5 mLs into the vein every 28 (twenty-eight) days.   predniSONE 5 MG tablet Commonly known as: DELTASONE Take 5 mg by mouth daily with breakfast.   rosuvastatin 20 MG tablet Commonly known as: CRESTOR Take 20 mg by mouth at bedtime.   senna-docusate 8.6-50 MG tablet Commonly known as: Senokot-S Take 1 tablet by mouth as needed for mild constipation.   Syringe Luer Lock  3 ML Misc Use 1 syringe once weekly to inject Testosterone medication.   tamsulosin 0.4 MG Caps capsule Commonly known as: FLOMAX Take 0.8 mg by mouth at bedtime.   testosterone cypionate 200 MG/ML injection Commonly known as: DEPOTESTOSTERONE CYPIONATE INJECT 0.6 MLS (120 MG TOTAL) INTO THE MUSCLE EVERY 7 (SEVEN) DAYS.   traMADol 50 MG tablet Commonly known as: ULTRAM Take 1 tablet (50 mg total) by mouth every 6 (six) hours as needed.   traZODone 50 MG tablet Commonly known as: DESYREL Take 50-100 mg by mouth at bedtime as needed for sleep.   UNABLE TO FIND BiPAP: At bedtime   zolpidem 10 MG tablet Commonly known as: AMBIEN Take 10-20 mg by mouth at bedtime as needed for sleep.        Allergies: No Known Allergies  Past Medical History:  Diagnosis Date   Anemia    low hgb at present   Arthritis    FEET   Blood transfusion without reported diagnosis 1974   kidney removed - L   Cancer ([place])    skin ca right shoulder, plastic dsyplasia, pre-Ca polpys removed on Colonoscopy- 07/2014   Colon polyps 07/23/2014   Tubular adenomas x 5   Constipation    Diabetes mellitus without complication ([place])    Type 2   Difficult intubation    unsure of actual problem but it was during the January 28, 2015 procedure.   Dysrhythmia    Eczema    ESRD (end stage renal disease) (H[facility])    hEMO mwf   Headache(784.0)    slight dull h/a due kidney failure   Hyperlipidemia    Hypertension    SVT h/o , followed by Dr. Claiborne Billings   Hypothyroidism    Macular degeneration    Neuropathy  right hand and both feet   PAT (paroxysmal atrial tachycardia) (HCC)    PSVT (paroxysmal supraventricular tachycardia) (HCC)    SBO (small bowel obstruction) (HCC)    Shortness of breath    with exertion   Sleep apnea 06/18/11   split-night sleep study- [facility]., BiPAP- 13-16   Thyroid disease    Umbilical hernia    will repair with thi surgery    Past Surgical History:   Procedure Laterality Date   AV FISTULA PLACEMENT Right 02/25/2015   Procedure: RIGHT ARTERIOVENOUS (AV) FISTULA CREATION;  Surgeon: Serafina Mitchell, MD;  Location: [facility];  Service: Vascular;  Laterality: Right;   AV FISTULA PLACEMENT Right 09/15/2015   Procedure: ARTERIOVENOUS (AV) FISTULA CREATION- RIGHT ARM;  Surgeon: Serafina Mitchell, MD;  Location: [place] [place];  Service: Vascular;  Laterality: Right;   CAPD INSERTION N/A 05/18/2014   Procedure: LAPAROSCOPIC INSERTION CONTINUOUS AMBULATORY PERITONEAL DIALYSIS  (CAPD) CATHETER;  Surgeon: Ralene Ok, MD;  Location: [place];  Service: General;  Laterality: N/A;   COLONOSCOPY     COLONOSCOPY WITH PROPOFOL N/A 07/12/2016   Procedure: COLONOSCOPY WITH PROPOFOL;  Surgeon: [name], MD;  Location: [facility];  Service: Endoscopy;  Laterality: N/A;   declotting of fistula  08/2015   ESOPHAGOGASTRODUODENOSCOPY (EGD) WITH PROPOFOL N/A 07/12/2016   Procedure: ESOPHAGOGASTRODUODENOSCOPY (EGD) WITH PROPOFOL;  Surgeon: [name], MD;  Location: [facility];  Service: Endoscopy;  Laterality: N/A;   FISTULA SUPERFICIALIZATION Right 02/07/2016   Procedure: RIIGHT UPPER ARM FISTULA SUPERFICIALIZATION;  Surgeon: Elam Dutch, MD;  Location: [place];  Service: Vascular;  Laterality: Right;   IJ catheter insertion     INSERTION OF DIALYSIS CATHETER Right 02/25/2015   Procedure: INSERTION OF RIGHT INTERNAL JUGULAR DIALYSIS CATHETER;  Surgeon: Serafina Mitchell, MD;  Location: [facility];  Service: Vascular;  Laterality: Right;   KIDNEY TRANSPLANT     LAPAROSCOPIC REPOSITIONING CAPD CATHETER N/A 06/16/2014   Procedure: LAPAROSCOPIC REPOSITIONING CAPD CATHETER;  Surgeon: Ralene Ok, MD;  Location: [facility];  Service: General;  Laterality: N/A;   LAPAROSCOPY N/A 05/04/2015   Procedure: LAPAROSCOPY DIAGNOSTIC LYSIS OF ADHESIONS;  Surgeon: Coralie Keens, MD;  Location: [facility];  Service: General;  Laterality: N/A;   MINOR REMOVAL OF PERITONEAL DIALYSIS CATHETER N/A  04/28/2015   Procedure:  REMOVAL OF PERITONEAL DIALYSIS CATHETER;  Surgeon: Coralie Keens, MD;  Location: [facility];  Service: General;  Laterality: N/A;   NEPHRECTOMY Left 1974   RENAL BIOPSY Right 2012   REVISON OF ARTERIOVENOUS FISTULA Right 05/26/2015   Procedure: SUPERFICIALIZATION OF ARTERIOVENOUS FISTULA WITH SIDE BRANCH LIGATIONS;  Surgeon: Serafina Mitchell, MD;  Location: [facility];  Service: Vascular;  Laterality: Right;   SKIN CANCER EXCISION     right shoulder   SVT ABLATION N/A 11/23/2016   Procedure: SVT Ablation;  Surgeon: Will Meredith Leeds, MD;  Location: [facility];  Service: Cardiovascular;  Laterality: N/A;   UMBILICAL HERNIA REPAIR N/A 05/18/2014   Procedure: HERNIA REPAIR UMBILICAL ADULT;  Surgeon: Ralene Ok, MD;  Location: [facility];  Service: General;  Laterality: N/A;   UMBILICAL HERNIA REPAIR N/A 04/28/2015   Procedure: UMBILICAL HERNIA REPAIR WITH MESH;  Surgeon: Coralie Keens, MD;  Location: [place];  Service: General;  Laterality: N/A;    Family History  Problem Relation Age of Onset   Colon polyps Father    Colon cancer Neg Hx    Stomach cancer Neg Hx  Social History:  reports that he quit smoking about 8 years ago. His smoking use included cigars. He has never used smokeless tobacco. He reports current alcohol use. He reports that he does not use drugs.  Review of Systems:  Hypertension:    His blood pressure has been managed by his nephrologist and transplant team monitors BP at home also  BP Readings from Last 3 Encounters:  03/28/21 (!) 148/94  12/14/20 (!) 142/78  12/01/20 132/70    Lipids:  His LDL is below 100 with 20 mg Crestor, he thinks the dose was increased in 9/21, prescribed by another physician Has no evidence of vascular disease     Lab Results  Component Value Date   CHOL 147 08/31/2020   CHOL 158 07/07/2019   CHOL 153 04/29/2019   Lab Results  Component Value Date   HDL 53.40 08/31/2020   HDL 47 07/07/2019   HDL  54.30 04/29/2019   Lab Results  Component Value Date   LDLCALC 75 08/31/2020   [place] 92 07/07/2019   [place] 80 04/29/2019   Lab Results  Component Value Date   TRIG 97.0 08/31/2020   TRIG 93 07/07/2019   TRIG 92.0 04/29/2019   Lab Results  Component Value Date   CHOLHDL 3 08/31/2020   CHOLHDL 3 04/29/2019   CHOLHDL 3 07/10/2018   No results found for: LDLDIRECT   HYPOTHYROIDISM: He has had long-standing hypothyroidism  Has been taking his 100 g levothyroxine before breakfast daily  TSH normal  Lab Results  Component Value Date   TSH 1.11 12/13/2020       HYPOGONADISM: 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  When testosterone level was low he was having fatigue and decreased motivation and because of his  he was prescribed He has been using AndroGel since February 2019 but subsequently stopped this because of cost  Current regimen is Depo testosterone 120 mg weekly taking injections on Saturday  He was told to take 120 mg/0.6 mL weekly Now testosterone level is just under 500, previously 644   Pretreatment free testosterone level low at 3.7     Lab Results  Component Value Date   TESTOSTERONE 488.06 03/23/2021   TESTOSTERONE 643.62 12/13/2020   TESTOSTERONE 366.67 10/27/2020    He has depression, taking 30 mg of Cymbalta; followed by PCP  Diabetic neuropathy: Has mild sensory loss on his last exam Followed by podiatrist    Examination:   BP (!) 148/94   Pulse 76   Ht _0  (1.803 m)   Wt 299 lb 9.6 oz (135.9 kg)   SpO2 95%   BMI 41.79 kg/m   Body mass index is 41.79 kg/m.    ASSESSMENT/ PLAN:    Diabetes type 2 with obesity, insulin-requiring:   See history of present illness for detailed discussion of his current management, problems identified and sugar patterns on his meter download  Has been on basal insulin on his OmniPod pump with U-500 and mealtime insulin using NovoLog FlexPen   A1c is  last 6.9, now 7.5  With intermittent glucose monitoring difficult to get a blood sugar pattern He will have sporadic high readings at all times and likely his basal rates are adequate since several Premeal readings are fairly good Mealtime coverage is variable and difficult to assess   Recommendations for 6/22: Continue same pump settings for now Reminded him to start taking his NOVOLOG consistently before every meal that has carbohydrates.  Also given him  the option of using NovoLog in the pump also but he refuses He must take the NOVOLOG regardless of the Premeal blood sugar even if normal For now continue 1: 10 carbohydrate coverage until further data is available on his blood sugars Try to get the freestyle libre otherwise check blood sugars at least 4 times a day including before and after meals  Consider Ozempic if covered and prescription will be sent. Explained to the patient what GLP-1 drugs are, the sites of actions and the body, reduction of hunger sensation and improved insulin secretion.  Discussed the benefit of weight loss with these medications. Explained possible side effects of OZEMPIC, most commonly nausea that usually improves over time.  Also explained safety information associated with the medication Demonstrated the medication injection device and injection technique to the patient.  To start the injections with the 0.25 mg dosage weekly for the first 4 injections and then go up to 0.5 mg weekly if no continued nausea Patient brochure on Ozempic with enclosed co-pay card given Recheck fructosamine on next visit   Depo testosterone dosage to be continued at 0.6 mL weekly  HYPOTHYROIDISM: TSH is to be checked on the next visit  LIPIDS: We will need follow-up  There are no Patient Instructions on file for this visit.     Elayne Snare 03/28/2021, 9:50 AM

## 2021-04-03 ENCOUNTER — Other Ambulatory Visit: Payer: Self-pay | Admitting: Cardiovascular Disease

## 2021-04-03 DIAGNOSIS — I350 Nonrheumatic aortic (valve) stenosis: Secondary | ICD-10-CM

## 2021-04-03 DIAGNOSIS — I10 Essential (primary) hypertension: Secondary | ICD-10-CM

## 2021-04-12 ENCOUNTER — Other Ambulatory Visit: Payer: Self-pay | Admitting: Endocrinology

## 2021-04-28 ENCOUNTER — Telehealth: Payer: Self-pay | Admitting: Endocrinology

## 2021-04-28 ENCOUNTER — Other Ambulatory Visit: Payer: Self-pay

## 2021-04-28 MED ORDER — FREESTYLE LIBRE 2 SENSOR MISC: 2.0000 | 3 refills | Status: DC

## 2021-04-28 NOTE — Telephone Encounter (Signed)
Pt is requesting a 90 day supply of Freestyle Libre 2 supplies and a device. Pt states this would be the first time ordering this. Please send to :  [id number] - [place], [place] - [address] AT [address] Phone:  [phone]  Fax:  [phone]      Pt also states he tried the online copay card for Novolog and it didn't work, he would like to know if we have anything like a card that could help with pricing?

## 2021-04-28 NOTE — Telephone Encounter (Signed)
Called and lvm for patient to inform that we sent in a 90 supply to his pharmacy for his libra , and that he can go to [facility] to for patient assist to help with cost of his novolog , once he has completed the form he bring by the office so we can fax the information along with requested information we send this to [employer] [facility] on his behalf. Left this information on patient voicemail.

## 2021-05-02 ENCOUNTER — Ambulatory Visit (HOSPITAL_COMMUNITY): Payer: 59 | Attending: Cardiovascular Disease

## 2021-05-02 ENCOUNTER — Other Ambulatory Visit: Payer: Self-pay

## 2021-05-02 DIAGNOSIS — I350 Nonrheumatic aortic (valve) stenosis: Secondary | ICD-10-CM | POA: Diagnosis not present

## 2021-05-02 LAB — ECHOCARDIOGRAM COMPLETE
AR max vel: 0.85 cm2
AV Area VTI: 0.81 cm2
AV Area mean vel: 0.75 cm2
AV Mean grad: 44.4 mmHg
AV Peak grad: 63.6 mmHg
Ao pk vel: 3.99 m/s
Area-P 1/2: 4.6 cm2
S' Lateral: 3.9 cm

## 2021-05-16 ENCOUNTER — Ambulatory Visit: Payer: 59 | Admitting: Cardiovascular Disease

## 2021-05-16 ENCOUNTER — Encounter: Payer: Self-pay | Admitting: Cardiovascular Disease

## 2021-05-16 ENCOUNTER — Telehealth: Payer: Self-pay

## 2021-05-16 ENCOUNTER — Other Ambulatory Visit: Payer: Self-pay

## 2021-05-16 ENCOUNTER — Ambulatory Visit (INDEPENDENT_AMBULATORY_CARE_PROVIDER_SITE_OTHER): Payer: 59 | Admitting: Cardiovascular Disease

## 2021-05-16 DIAGNOSIS — Z940 Kidney transplant status: Secondary | ICD-10-CM

## 2021-05-16 DIAGNOSIS — I10 Essential (primary) hypertension: Secondary | ICD-10-CM | POA: Diagnosis not present

## 2021-05-16 DIAGNOSIS — Z794 Long term (current) use of insulin: Secondary | ICD-10-CM

## 2021-05-16 DIAGNOSIS — E118 Type 2 diabetes mellitus with unspecified complications: Secondary | ICD-10-CM

## 2021-05-16 DIAGNOSIS — E785 Hyperlipidemia, unspecified: Secondary | ICD-10-CM

## 2021-05-16 DIAGNOSIS — Z9989 Dependence on other enabling machines and devices: Secondary | ICD-10-CM

## 2021-05-16 DIAGNOSIS — I350 Nonrheumatic aortic (valve) stenosis: Secondary | ICD-10-CM | POA: Diagnosis not present

## 2021-05-16 DIAGNOSIS — G4733 Obstructive sleep apnea (adult) (pediatric): Secondary | ICD-10-CM

## 2021-05-16 DIAGNOSIS — N1832 Chronic kidney disease, stage 3b: Secondary | ICD-10-CM

## 2021-05-16 NOTE — Patient Instructions (Addendum)
Medication Instructions:  Per Dr. Claiborne Billings take 40mg  of your Lasix today, then continue with your 20mg  daily as needed.   *If you need a refill on your cardiac medications before your next appointment, please call your pharmacy*   Lab Work: None ordered.   Testing/Procedures: None ordered.    Follow-Up: At [facility], you and your health needs are our priority.  As part of our continuing mission to provide you with exceptional heart care, we have created designated Provider Care Teams.  These Care Teams include your primary Cardiologist (physician) and Advanced Practice Providers (APPs -  Physician Assistants and Nurse Practitioners) who all work together to provide you with the care you need, when you need it.  We recommend signing up for the patient portal called "MyChart".  Sign up information is provided on this After Visit Summary.  MyChart is used to connect with patients for Virtual Visits (Telemedicine).  Patients are able to view lab/test results, encounter notes, upcoming appointments, etc.  Non-urgent messages can be sent to your provider as well.   To learn more about what you can do with MyChart, go to [email or web address].    Your next appointment:   Follow up with Dr. Claiborne Billings following your nephrology visit, around [place].   The format for your next appointment:   In Person  Provider:   Shelva Majestic, MD  Per Dr. Claiborne Billings you should use support stockings, please refer to the information provided regarding support stockings.

## 2021-05-16 NOTE — Progress Notes (Signed)
Patient ID: Abdullahi K Innocent, male   DOB: 02/01/1961, 59 y.o.   MRN: [id number]     HPI: Jamarcus K Hornback, is a 59 y.o. male who is a former patient of Dr. Richard Weintraub. He presents for a 6 month follow-up evaluation.  Mr. Hassaan Bently  has a complex medical history that includes a congenital nonfunctioning left kidney for which he underwent left nephrectomy at age 12. He has a history of obesity, diabetes mellitus, and has been demonstrated to have proteinuria of his remaining single kidney, being status post open renal biopsy in [place] in November 2010. This revealed focal segmental glomerular sclerosis. At that time he was seen by Dr. Michael Rocco in the nephrology department at [facility]. He sees Dr. Goldsborough in [place] for his kidney insufficiency and has been on ARB therapy in the past in an attempt to reduce proteinuria. He sees Dr. Kumar for his diabetes mellitus. He also has a history of hypothyroidism, hyperlipidemia, anxiety and depression. He has a history of prior supraventricular tachycardia requiring hospitalization in 2007 as was felt to be possibly AV nodal reentrant rhythm.  He has a history of complex sleep apnea initially diagnosed in 2007 with an AHI at that time of 56.7 per hour and during REM sleep this increased to 65.7 per hour. At that time he dropped his oxygen 84% and had loud snoring. He had been on BiPAP therapy. He underwent a 5 year followup split-night study in 2012 which again confirmed severe sleep apnea with an AHI of 60.7 on the baseline portion of the study. He was unable to achieve REM sleep at that time. Oxygen saturation during non-REM sleep at 83%. He was titrated utilizing BiPAP up to a setting of 16/12 cm water pressure. He does admit to continued residual daytime sleepiness despite using his BiPAP therapy.  An echo Doppler study in August 2012 showed mild concentric LVH with normal systolic function and grade 1  diastolic dysfunction. He had trace mitral and tricuspid insufficiency as well as pulmonic insufficiency. A nuclear perfusion study in August 2012 showed normal perfusion.  Mr. Teigen was on dialysis Monday, [name].  He is on the [facility] transplant list.  In January 2017 he had an stress echo at [facility] as part of his transplant evaluation.  I do not have the results of this evaluation.  He has had some difficulty with a steal syndrome in his right hand since he had AV fistulas inserted in the forearm and most recently now in the upper arm.  He continues to use BiPAP with 100% compliance.  He had a ResMed S9VPAP Auto unit.  He obtains his CPAP supplies online because they are cheaper, but had seen [facility].  He is using a Mirage Quatro full face mask.  A download in the office from 05/28/2016 through 07/26/2016.  He is meeting compliance standards with usage at 85% usage stays greater than 4 hours at 77%.  He is averaging 6 hours and 43 minutes per night.  He is set at an IPAP pressure of 16 and EPAP pressure of 12.  His AHI was mildly increased at 6.5 with 5.2 per hour of apneic events and 1.3 of hypopnea events.  When I last saw him he had stopped taken Bystolic since his blood pressure had become low at the end of dialysis.  He had been on 5 mg on nondialysis days.  He tells me his aspirin was discontinued because he was   continuously having issues with bleeding following his dialysis when the catheter was removed.   He had an episode of SVT in December 2017, which was successfully aborted with vagal maneuvers.  In the emergency room, he had 2-3 more recurrences of SVT, of which, again, all aborted with vagal maneuvers.  He was started on verapamil and discharge from the hospital.  He has seen Dr. Curt Bears and had an EP study with attempted ablation, but apparently he was not able to be induced and ablation was not performed.   He denies any recurrent episodes of SVT.  Remotely, he  may of had some mild nonexertional musculoskeletal discomfort but has not experienced any of this recently.  Reportedly, his remote stress echo was negative for ischemia, although mild ST changes were noted in recovery.  He continues to be asymptomatic.  He underwent a kidney transplant from a living donor on 02/25/2017 at [facility].  He tolerated surgery well but has developed a postoperative inguinal hernia, which will ultimately need to be surgically repaired.  He received a new BiPAP machine from [facility] 2 weeks ago.  He has a full face mask, but admits to increased leak.    Since I saw him in September 2018, he had 2 hospital stays at [facility].  He developed intestinal bleeding due to diverticular disease and also had some dehydration issues.  He also underwent inguinal hernia surgery.  He denies any recent chest pain or palpitations.  He continues to use BiPAP with 100% compliance.  However, he did not have his machine with him for the days he was in the hospital.  He brought his machine with him today for interrogation.  Over the past month, usage hours and 6 hours and 32 minutes per night with an HI of 0.9.  He used to machine 27 of 30 nights nights that were not used was because he was in the hospital.  Usage greater than 4 hours was 26 of 30.  He is on a BiPAP pressure of 18/13 0.9.  Recently he has noticed a mild leak.  AHI was 5.6 over the past 7 days.  He has a ResMed fullface mask, Mirage Quatro mask.    When I saw him in November 2018, he was feeling well. He is seeing Dr. Azzie Glatter at [facility] from a nephrology standpoint.  He denies any episodes of chest pain or awareness of recurrent arrhythmia.  He admits to 100% use with CPAP.  [name] is his DME company.  He has a ResMed air 10 V auto unit with an EPAP pressure 14.  An IPAP pressure set at 18.  A download from 01/14/2017 through 02/12/2018 shows 100% compliance.  He is averaging 8 hours and 7 minutes of sleep.  AHI is excellent at  2.6.    I saw him in May 2019 and since that time, he has continued to follow at [facility]. He was last evaluated by me in a telemedicine visit in April 2020.  At that time he had noticed that over the past year  his blood pressure had increased and his lisinopril dose was increased to 20 mg and amlodipine reduced to 5 mg.  His weight had fluctuated and has increased from 293 to 303.  He has continued to use BiPAP with 100% compliance.  I was able to obtain a download today from March 23 through January 27, 2019.  He is 100% compliant with average BiPAP use of 7 hours and 10 minutes.  At  his 18/14 pressure, AHI remains excellent at 2.6/h.  Over the past 6 months he has changed jobs twice.  He is been staying home particularly with his significant comorbidities which can be affected by the coronavirus including hypertension, diabetes mellitus, obesity, renal disease, and immunosuppression.  He walks approximately 2 times a day for short duration.  He receives immunosuppressant infusion with [employer] and is scheduled for his next infusion: On Feb 12, 2019.  His last lipid panel was in October 2019 with total cholesterol 137 and LDL cholesterol 73.  He is scheduled to see his nephrologist in August.    I saw him in the office in February 2021 and since his  telemedicine evaluation in April 2020, he continued to be followed closely at [facility].  Creatinine had increased to 1.74.  He has noticed blood pressure elevation.  He has not been successful with weight loss.  He has been without anginal symptoms or awareness of recurrent SVT.  He has continued to use BiPAP therapy.  A download from 10/22/2019 through 11/20/1999 shows 93% of usage days.  Average use is 8 hours and 10 minutes.  At his pressure of 18/14, AHI is excellent at 1.5.  There is no mask leak.  During his February 2021 evaluation he had a 2/6 murmur consistent with aortic stenosis and I recommended follow-up echo Doppler evaluation.   He underwent an echo Doppler  study on December 08, 2019 which showed an EF of 65 to 70%, moderate left ventricular hypertrophy and grade 1 diastolic dysfunction.  There was mild mitral valve regurgitation.  There was moderate to severe aortic stenosis with a mean gradient of 39 peak gradient of 58.   He was evaluated in a telemedicine visit in June 2021.  At that time he denied any chest pain, presyncope or syncope.  He continues to go to [facility] for nephrology follow-up.  He states his blood pressure is stable every morning upon awakening but as a day progresses it may go up slightly.  He denies any exertional shortness of breath.  At times he has noticed some mild lower extremity edema.  Since I last saw him he was evaluated at [facility] in July 2021 and was hospitalized overnight with right lower quadrant pain around his renal allograft.  A CT scan demonstrated normal-appearing transplant kidney without any acute abdominal/pelvic abnormalities however, significant stool burden was noted on CT suggesting moderate to severe constipation.  I last saw him in December 2021 at which time he denied any chest pain but had experienced some mild shortness of breath particularly with walking up steps.Marland Kitchen He is now on insulin pump.  He has noticed some lower extremity edema.  He continued to use BiPAP.  A download was obtained in the office today from November 7 through September 12, 2020 which shows excellent compliance.  There were only 2 days of nonuse when he never went to bed and it stayed in a recliner.  He is on BiPAP with a pressure of 18/14.  He is averaging 8 hours and 54 minutes of BiPAP use per night.  AHI is excellent at 2.0.  He does have mask leak and has been noticing a leak in the region of his chin.    I recommended he have a follow-up echo Doppler study which was done on November 16, 2020.  This showed continued hyperdynamic LV function with EF at 65 to 70% and moderate LVH with grade 2 diastolic dysfunction.  RV size was normal.  Moderately  elevated pulmonary artery systolic pressure at 45 mm.  There was mild mitral stenosis with a mean valve gradient of 3.2 mm with moderately severe mitral annular calcification.  There was moderate calcification of his trileaflet aortic valve.  He was felt to have moderate aortic stenosis with a mean gradient of 34.6 and peak gradient of 57 with calculated valve area 1.05 cm.  I last saw him on December 01, 2020 at which time he continued to feel well but was experiencing some some leg edema and  shortness of breath with activity.  He is followed closely at [facility] for his renal transplant.  Most recent creatinine on October 27, 2020 was further increased at 1.89.  Hemoglobin A1c was 6.9. He denies palpitations.  He continues to use BiPAP.  I obtained a new download in the office today from January 25 through November 30, 2020.  Compliance is excellent with average use at 10 hours and 16 minutes per night.  At his BiPAP pressure of 18/14, AHI is 2.3.  There are nights with significant mask leak.  During that evaluation, I recommended that he undergo a follow-up echo Doppler study for reassessment of his aortic valve which may be progressing and it stenosis.  Recently, he has noticed some slight increase shortness of breath with exertion.  He denies any presyncope or syncope.  However at times when his blood pressure gets too low he does note transient dizziness.  He admits to swelling in his lower extremities right leg greater than left and has taken as needed furosemide but has not taken this recently.  He is scheduled to undergo a renal transplant reevaluation in October at [facility].  He denies any chest pain.  He denies any palpitations.  He underwent an echo Doppler study on May 02, 2021.  EF remained stable at 60 to 65%.  There was moderate left ventricular hypertrophy and there was grade 2 diastolic dysfunction.  There was moderate left atrial dilatation.  There was moderate calcification of a trileaflet aortic  valve with moderately severe aortic stenosis.  The mean gradient had now increased to 44.4 and the peak instantaneous gradient to 64 mmHg.  The aortic valve area (VTI) was 0.81 cm.  There was mild dilation of the ascending aorta at 42 mm.  He continues to use BiPAP with 100% compliance.  New download was obtained from 10 through May 15, 2021 and average use is 8 hours and 15 minutes per night.  His BiPAP settings are 18/14 and AHI is excellent at 1.6/h. He presents for evaluation.    Past Medical History:  Diagnosis Date   Anemia    low hgb at present   Arthritis    FEET   Blood transfusion without reported diagnosis 1974   kidney removed - L   Cancer ([place])    skin ca right shoulder, plastic dsyplasia, pre-Ca polpys removed on Colonoscopy- 07/2014   Colon polyps 07/23/2014   Tubular adenomas x 5   Constipation    Diabetes mellitus without complication ([place])    Type 2   Difficult intubation    unsure of actual problem but it was during the January 28, 2015 procedure.   Dysrhythmia    Eczema    ESRD (end stage renal disease) (HCC)    hEMO mwf   Headache(784.0)    slight dull h/a due kidney failure   Hyperlipidemia    Hypertension    SVT h/o , followed by Dr. Claiborne Billings   Hypothyroidism  Macular degeneration    Neuropathy    right hand and both feet   PAT (paroxysmal atrial tachycardia) (HCC)    PSVT (paroxysmal supraventricular tachycardia) (HCC)    SBO (small bowel obstruction) (HCC)    Shortness of breath    with exertion   Sleep apnea 06/18/11   split-night sleep study- [facility]., BiPAP- 13-16   Thyroid disease    Umbilical hernia    will repair with thi surgery    Past Surgical History:  Procedure Laterality Date   AV FISTULA PLACEMENT Right 02/25/2015   Procedure: RIGHT ARTERIOVENOUS (AV) FISTULA CREATION;  Surgeon: Serafina Mitchell, MD;  Location: [place];  Service: Vascular;  Laterality: Right;   AV FISTULA PLACEMENT Right 09/15/2015    Procedure: ARTERIOVENOUS (AV) FISTULA CREATION- RIGHT ARM;  Surgeon: Serafina Mitchell, MD;  Location: [place];  Service: Vascular;  Laterality: Right;   CAPD INSERTION N/A 05/18/2014   Procedure: LAPAROSCOPIC INSERTION CONTINUOUS AMBULATORY PERITONEAL DIALYSIS  (CAPD) CATHETER;  Surgeon: Ralene Ok, MD;  Location: [place];  Service: General;  Laterality: N/A;   COLONOSCOPY     COLONOSCOPY WITH PROPOFOL N/A 07/12/2016   Procedure: COLONOSCOPY WITH PROPOFOL;  Surgeon: [name], MD;  Location: [facility];  Service: Endoscopy;  Laterality: N/A;   declotting of fistula  08/2015   ESOPHAGOGASTRODUODENOSCOPY (EGD) WITH PROPOFOL N/A 07/12/2016   Procedure: ESOPHAGOGASTRODUODENOSCOPY (EGD) WITH PROPOFOL;  Surgeon: [name], MD;  [facility];  Service: Endoscopy;  Laterality: N/A;   FISTULA SUPERFICIALIZATION Right 02/07/2016   Procedure: RIIGHT UPPER ARM FISTULA SUPERFICIALIZATION;  Surgeon: Elam Dutch, MD;  Location: [place];  Service: Vascular;  Laterality: Right;   IJ catheter insertion     INSERTION OF DIALYSIS CATHETER Right 02/25/2015   Procedure: INSERTION OF RIGHT INTERNAL JUGULAR DIALYSIS CATHETER;  Surgeon: Serafina Mitchell, MD;  Location: [place] [facility];  Service: Vascular;  Laterality: Right;   KIDNEY TRANSPLANT     LAPAROSCOPIC REPOSITIONING CAPD CATHETER N/A 06/16/2014   Procedure: LAPAROSCOPIC REPOSITIONING CAPD CATHETER;  Surgeon: Ralene Ok, MD;  Location: [place];  Service: General;  Laterality: N/A;   LAPAROSCOPY N/A 05/04/2015   Procedure: LAPAROSCOPY DIAGNOSTIC LYSIS OF ADHESIONS;  Surgeon: Coralie Keens, MD;  Location: [place];  Service: General;  Laterality: N/A;   MINOR REMOVAL OF PERITONEAL DIALYSIS CATHETER N/A 04/28/2015   Procedure:  REMOVAL OF PERITONEAL DIALYSIS CATHETER;  Surgeon: Coralie Keens, MD;  Location: [place];  Service: General;  Laterality: N/A;   NEPHRECTOMY Left 1974   RENAL BIOPSY Right 2012   REVISON OF ARTERIOVENOUS FISTULA Right 05/26/2015    Procedure: SUPERFICIALIZATION OF ARTERIOVENOUS FISTULA WITH SIDE BRANCH LIGATIONS;  Surgeon: Serafina Mitchell, MD;  Location: [place];  Service: Vascular;  Laterality: Right;   SKIN CANCER EXCISION     right shoulder   SVT ABLATION N/A 11/23/2016   Procedure: SVT Ablation;  Surgeon: Will Meredith Leeds, MD;  Location: [place] [facility];  Service: Cardiovascular;  Laterality: N/A;   UMBILICAL HERNIA REPAIR N/A 05/18/2014   Procedure: HERNIA REPAIR UMBILICAL ADULT;  Surgeon: Ralene Ok, MD;  Location: [place];  Service: General;  Laterality: N/A;   UMBILICAL HERNIA REPAIR N/A 04/28/2015   Procedure: UMBILICAL HERNIA REPAIR WITH MESH;  Surgeon: Coralie Keens, MD;  Location: [place];  Service: General;  Laterality: N/A;    Left nephrectomy in 1974.  No Known Allergies  Current Outpatient Medications  Medication Sig Dispense Refill   ACCU-CHEK FASTCLIX  LANCETS MISC Use lancets to check blood sugar 5 times daily. 500 each 3   amLODipine (NORVASC) 10 MG tablet TAKE 1 TABLET BY MOUTH EVERY DAY 90 tablet 1   amoxicillin (AMOXIL) 500 MG tablet Take 1,000 mg by mouth 2 (two) times daily. Before dental visits     aspirin EC 81 MG tablet Take 81 mg by mouth daily.     B-D UF III MINI PEN NEEDLES 31G X 5 MM MISC USE 4 PER DAY TO INJECT INSULIN. 100 each 5   BD INSULIN SYRINGE U/F 31G X 5/16" 1 ML MISC USE 3 TIMES A DAY WITH HUMULIN R 90 each 2   belatacept (NULOJIX) 250 MG SOLR injection Inject 24.5 mLs into the vein every 28 (twenty-eight) days.      Blood Glucose Monitoring Suppl (ACCU-CHEK GUIDE) w/Device KIT 1 each by Does not apply route daily. Use meter to check blood sugar 5 times daily. 1 kit 0   Blood Glucose Monitoring Suppl (FIFTY50 GLUCOSE METER 2.0) w/Device KIT Use as directed     Continuous Blood Gluc Sensor (FREESTYLE LIBRE 2 SENSOR) MISC 2 Devices by Does not apply route every 14 (fourteen) days. 6 each 3   DULoxetine (CYMBALTA) 60 MG capsule Take 60 mg by mouth daily.      fluocinonide cream (LIDEX) 3.81 % Apply 1 application topically daily as needed (Skin inflammation (hand, thigh, back)).      furosemide (LASIX) 20 MG tablet Take 20 mg by mouth daily. Takes As Needed     gabapentin (NEURONTIN) 100 MG capsule Take 200-300 mg by mouth See admin instructions. 200 mg in the morning and 300 mg at bedtime     gentamicin cream (GARAMYCIN) 0.1 % Apply 1 application topically 2 (two) times daily. 30 g 1   glucose blood (ACCU-CHEK GUIDE) test strip 1 each by Other route as needed for other. Use as instructed to check blood sugar 5 times daily. 500 each 3   HUMULIN R 500 UNIT/ML injection USE MAX 200 UNITS IN PUMP EVERY THREE DAYS 60 mL 2   insulin aspart (NOVOLOG FLEXPEN) 100 UNIT/ML FlexPen Inject 50 Units into the skin 3 (three) times daily with meals. 135 mL 1   Insulin Syringe-Needle U-100 (BD INSULIN SYRINGE ULTRAFINE) 31G X 5/16" 0.3 ML MISC Use to inject insulin 2-3 times per day 300 each 1   lactulose (CHRONULAC) 10 GM/15ML solution Take 10 g by mouth daily as needed.      Lancets Misc. (ACCU-CHEK FASTCLIX LANCET) KIT 1 each by Does not apply route daily. 1 kit 0   levothyroxine (SYNTHROID) 100 MCG tablet TAKE 1 TABLET BY MOUTH EVERY DAY 90 tablet 3   lisinopril (ZESTRIL) 20 MG tablet Take 20 mg by mouth 2 (two) times daily.     metoprolol tartrate (LOPRESSOR) 50 MG tablet TAKE 1 TABLET BY MOUTH TWICE A DAY 180 tablet 3   mycophenolate (CELLCEPT) 250 MG capsule Take 1,000 mg by mouth every 12 (twelve) hours.      NEEDLE, DISP, 25 G (B-D DISP NEEDLE 25GX1") 25G X 1" MISC USE TO INJECT TESTOSTERONE (EXCHANGE NEEDLE FROM 22G 3ML SYRINGE) 200 each 2   predniSONE (DELTASONE) 5 MG tablet Take 5 mg by mouth daily with breakfast.     rosuvastatin (CRESTOR) 20 MG tablet Take 20 mg by mouth at bedtime.     senna-docusate (SENOKOT-S) 8.6-50 MG tablet Take 1 tablet by mouth as needed for mild constipation.     Syringe, Disposable, (SYRINGE  LUER LOCK) 3 ML MISC Use 1 syringe  once weekly to inject Testosterone medication. 100 each 1   tamsulosin (FLOMAX) 0.4 MG CAPS capsule Take 0.8 mg by mouth at bedtime.      testosterone cypionate (DEPOTESTOSTERONE CYPIONATE) 200 MG/ML injection INJECT 0.6 MLS (120 MG TOTAL) INTO THE MUSCLE EVERY 7 (SEVEN) DAYS. 10 mL 1   traMADol (ULTRAM) 50 MG tablet Take 1 tablet (50 mg total) by mouth every 6 (six) hours as needed. (Patient taking differently: Take 50 mg by mouth every 6 (six) hours as needed. Takes 1 to 2 tabs every 6 hours) 15 tablet 0   traZODone (DESYREL) 50 MG tablet Take 50-100 mg by mouth at bedtime as needed for sleep.      UNABLE TO FIND BiPAP: At bedtime     zolpidem (AMBIEN) 10 MG tablet Take 10-20 mg by mouth at bedtime as needed for sleep.      Semaglutide,0.25 or 0.5MG /DOS, (OZEMPIC, 0.25 OR 0.5 MG/DOSE,) 2 MG/1.5ML SOPN Inject 0.5 mg into the skin once a week. (Patient not taking: Reported on 05/16/2021) 4.5 mL 1   No current facility-administered medications for this visit.   Social history is notable in that he is married for over 30 years. His 3 children. He has a degree in chemistry. 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 He does drink occasional alcohol. He has smoked cigars.  ROS General: Negative; No fevers, chills, or night sweats; positive for morbid obesity HEENT: Negative; No changes in vision or hearing, sinus congestion, difficulty swallowing Pulmonary: Negative; No cough, wheezing, shortness of breath, hemoptysis Cardiovascular: See HPI GI: Negative; No nausea, vomiting, diarrhea, or abdominal pain GU: Negative; No dysuria, hematuria, or difficulty voiding Renal: no longer on dialysis, status post renal transplant Musculoskeletal: Negative; no myalgias, joint pain, or weakness Hematologic/Oncology: Negative; no easy bruising, bleeding Endocrine: Positive for diabetes and hypothyroidism Neuro: Negative; no changes in balance, headaches Skin: Negative; No rashes or skin lesions Psychiatric:  Negative; No behavioral problems, depression Sleep: Positive for OSA on BiPAP; no daytime sleepiness, hypersomnolence, bruxism, restless legs, hypnogognic hallucinations, no cataplexy Other comprehensive 14 point system review is negative.  PE BP 110/88 (BP Location: Left Arm)   Pulse 61   Ht 5\' 11"  (1.803 m)   Wt 292 lb 12.8 oz (132.8 kg)   SpO2 96%   BMI 40.84 kg/m    Left arm blood pressure 138/78;  he has a right upper arm fistula.  Wt Readings from Last 3 Encounters:  05/16/21 292 lb 12.8 oz (132.8 kg)  03/28/21 299 lb 9.6 oz (135.9 kg)  12/14/20 298 lb (135.2 kg)   General: Alert, oriented, no distress.  Morbidly obese BMI of 40.84 Skin: normal turgor, no rashes, warm and dry HEENT: Normocephalic, atraumatic. Pupils equal round and reactive to light; sclera anicteric; extraocular muscles intact;  Nose without nasal septal hypertrophy Mouth/Parynx benign; Mallinpatti scale 3/4 Neck: No JVD, no carotid bruits; normal carotid upstroke Lungs: clear to ausculatation and percussion; no wheezing or rales Chest wall: without tenderness to palpitation Heart: PMI not displaced, RRR, s1 s2 normal, 1/6 systolic murmur, no diastolic murmur, no rubs, gallops, thrills, or heaves Abdomen: soft, nontender; no hepatosplenomehaly, BS+; abdominal aorta nontender and not dilated by palpation. Back: no CVA tenderness Pulses 2+ Musculoskeletal: full range of motion, normal strength, no joint deformities Extremities: no clubbing cyanosis or edema, Homan's sign negative  Neurologic: grossly nonfocal; Cranial nerves grossly wnl Psychologic: Normal mood and affect  ECG (independently read  by me):  Sinus rhythm at 61 with sinus arrythmia; LVH, T wave abnormality I, aVL, normal intervals  February 2022 ECG (independently read by me): NSR at 70; QS anteroseptally, inferolateral T wave abnormality  September 14, 2020 ECG (independently read by me): Normal sinus rhythm at 73 bpm.  Poor anterior R wave  progression V1 through V3.  Mild ST changes laterally.  Normal intervals.  No ectopy.  November 20, 2019 ECG (independently read by me): Normal sinus rhythm at 68 bpm.  Q-wave lead III.  Mild inferior T wave abnormality.  Normal intervals.  QTc interval 440 ms.  No ectopy  May 2019 ECG (independently read by me): NSR at 64; normal intervals.  No ectopy  November 2018 ECG (independently read by me): normal sinus rhythm at 67 bpm.  Normal intervals.  No ectopy.  September 2018 ECG (independently read by me): normal sinus rhythm at 71 bpm.  No ST segment changes.  Normal intervals.  May 2018 ECG (independently read by me): Normal sinus rhythm at 89 bpm.  No STT changes .  PR interval 126 ms.  QTc interval 457 ms. Normal ECG.  October 2017 ECG (independently read by me): Sinus tachycardia 101 bpm.  April 2017 ECG (independently read by me): Sinus tachycardia 100 bpm.  2.  TC interval 45 ms.  November 2014 ECG: Normal sinus rhythm;  PR interval 138 ms. QT interval 448 ms.  LABS: BMP Latest Ref Rng & Units 03/23/2021 12/13/2020 10/27/2020  Glucose 70 - 99 mg/dL 131(H) 240(H) 111(H)  BUN 6 - 23 mg/dL 27(H) 21 19  Creatinine 0.40 - 1.50 mg/dL 1.77(H) 1.69(H) 1.89(H)  BUN/Creat Ratio 9 - 20 - - -  Sodium 135 - 145 mEq/L 138 138 139  Potassium 3.5 - 5.1 mEq/L 3.9 4.4 4.5  Chloride 96 - 112 mEq/L 105 104 105  CO2 19 - 32 mEq/L $Remove'25 26 29  'zhBJqAU$ Calcium 8.4 - 10.5 mg/dL 9.9 9.4 10.3   Hepatic Function Latest Ref Rng & Units 05/31/2020 03/03/2020 02/12/2020  Total Protein 6.0 - 8.3 g/dL 6.3 5.9(L) 6.5  Albumin 3.5 - 5.2 g/dL 4.1 3.8 4.4  AST 0 - 37 U/L $Remo'14 21 15  'gGibW$ ALT 0 - 53 U/L $Remo'22 23 20  'TLICv$ Alk Phosphatase 39 - 117 U/L 67 73 87  Total Bilirubin 0.2 - 1.2 mg/dL 1.0 0.9 0.8    CBC Latest Ref Rng & Units 12/13/2020 05/31/2020 03/03/2020  WBC 4.0 - 10.5 K/uL 7.6 8.8 9.6  Hemoglobin 13.0 - 17.0 g/dL 14.2 13.8 14.2  Hematocrit 39.0 - 52.0 % 42.7 41.6 44.4  Platelets 150.0 - 400.0 K/uL 232.0 267.0 216    Lab  Results  Component Value Date   MCV 83.9 12/13/2020   MCV 85.1 05/31/2020   MCV 86.4 03/03/2020   Lab Results  Component Value Date   TSH 1.11 12/13/2020   Lab Results  Component Value Date   HGBA1C 7.5 (H) 03/23/2021   Lipid Panel     Component Value Date/Time   CHOL 147 08/31/2020 1013   TRIG 97.0 08/31/2020 1013   HDL 53.40 08/31/2020 1013   CHOLHDL 3 08/31/2020 1013   VLDL 19.4 08/31/2020 1013   LDLCALC 75 08/31/2020 1013    RADIOLOGY: No results found.  ECHO: 05/02/2021 IMPRESSIONS   1. Left ventricular ejection fraction, by estimation, is 60 to 65%. The  left ventricle has normal function. The left ventricle has no regional  wall motion abnormalities. There is moderate left ventricular hypertrophy.  Left  ventricular diastolic  parameters are consistent with Grade II diastolic dysfunction  (pseudonormalization). Elevated left ventricular end-diastolic pressure.   2. Right ventricular systolic function is normal. The right ventricular  size is normal. There is moderately elevated pulmonary artery systolic  pressure.   3. Left atrial size was moderately dilated.   4. The mitral valve is abnormal. Mild mitral valve regurgitation.  Moderate to severe mitral annular calcification.   5. The aortic valve is tricuspid. There is moderate calcification of the  aortic valve. Aortic valve regurgitation is trivial. Moderate to severe  aortic valve stenosis. Aortic valve area, by VTI measures 1.09 cm. Aortic  valve mean gradient measures 44.4   mmHg. Aortic valve Vmax measures 3.99 m/s. DI is 0.29 - based on LVOT  diameter of 2.2 cm.   6. Aortic dilatation noted. There is mild dilatation of the ascending  aorta, measuring 42 mm.   7. The inferior vena cava is dilated in size with >50% respiratory  variability, suggesting right atrial pressure of 8 mmHg.   Comparison(s): Changes from prior study are noted. 11/16/2020: LVEF 65-70%,  moderate AS -mean gradient 34.6 mmHg.    IMPRESSION:  1. Aortic valve stenosis, etiology of cardiac valve disease unspecified   2. OSA on CPAP   3. Essential hypertension   4. Morbid obesity ([place])   5. History of renal transplantation   6. Hyperlipidemia with target LDL less than 70   7. Type 2 diabetes mellitus with complication, with long-term current use of insulin ([place])   8. Stage 3b chronic kidney disease ([place])     ASSESSMENT AND PLAN: Mr. Badley is a 60 year old Caucasian male who has a history of morbid obesity, hypertension, diabetes mellitus , obstructive sleep apnea on BiPAP, hyperlipidemia, and end-stage kidney disease.  He is status post remote nephrectomy in1974 and on 02/25/2017 after having been on hemodialysis for 3 years he underwent successful renal transplantation from a living donor done at [facility].  He developed a postoperative inguinal hernia, which subsequently was repaired.  There also was a history of possible diverticular bleeding.  He has a history of SVT, but he has not had any recurrence.  He is followed closely by the transplantation team at [facility].  I reviewed recent [place] records.  On exam he has a murmur of aortic stenosis.  An echo Doppler study in March 2021 showed an EF at 65 to 16%, grade 1 diastolic dysfunction.  He had moderate to severe aortic valve stenosis with a mean gradient of 39 and peak gradient of 58.7.  Aortic valve area is 1.18 cm.  His blood pressure when seen in December 2021 was excellent at he was on amlodipine 10 mg, furosemide as needed for swelling, lisinopril 20 mg twice a day, metoprolol tartrate 50 mg twice a day.  He is on rosuvastatin 20 mg daily for hyperlipidemia.  LDL cholesterol on November 24 was 75.  He is diabetic on insulin.  He continues to be on levothyroxine for hypothyroidism.  He is now on Nulojix in addition to CellCept for his immunosuppressive therapy.  His echo Doppler study from February 2022 was essentially  unchanged.  He had  hyperdynamic LV function with EF 65 to 70%, moderate LVH, grade 2 diastolic dysfunction, and on her most recent echo aortic mean gradient was 34.6 with peak gradient of 57 and calculated valve area of 1.05 cm.  At his last evaluation he began to notice some exertional shortness of breath with activity and  also has noticed some leg swelling.  Typically his leg swelling is worse on the side of the transplantation which is his right lower extremity.  With his recent increase creatinine to 1.89 I suggested slight reduction of lisinopril from 20 mg twice a day down to 20 mg daily.  He tells me a more recent creatinine done at [facility] was 1.6-1.7.  On exam today he does have pitting edema in the right lower extremity.  I reviewed his most recent echo Doppler study and this continues to show excellent LV function with grade 2 diastolic dysfunction.  His aortic stenosis has percent progressed and is now in the severe range by virtue of his mean gradient of 44.  His peak gradient is 64 mmHg.  He will be undergoing a reevaluation with the [facility] transplant team in mid October.  I discussed with him that with his progressive AAS he may ultimately require definitive cardiac catheterization and extensive CT imaging for potential candidacy for TAVR.  I will see him in late October/early November in follow-up of his [facility] nephrology transplant evaluation.  I have suggested he wear support stockings least 20 to 30 mg.  With his edema I have suggested he take furosemide 40 mg today and then consider taking 20 mg possibly daily or every 2 to 3 days depending upon leg swelling which may also improve some of his exertional dyspnea.  He is morbidly obese and weight loss was recommended.  His OSA is well controlled with his current BiPAP settings and AHI is excellent at 1.6 on 18/14 cm of water.  I will see him in 2 months for reevaluation.   Troy Sine, MD, [facility]  05/16/2021 4:44 PM

## 2021-05-22 ENCOUNTER — Telehealth: Payer: Self-pay | Admitting: Cardiovascular Disease

## 2021-05-22 ENCOUNTER — Encounter: Payer: Self-pay | Admitting: Podiatry

## 2021-05-22 ENCOUNTER — Other Ambulatory Visit: Payer: Self-pay

## 2021-05-22 ENCOUNTER — Ambulatory Visit (INDEPENDENT_AMBULATORY_CARE_PROVIDER_SITE_OTHER): Payer: 59 | Admitting: Podiatry

## 2021-05-22 DIAGNOSIS — E1169 Type 2 diabetes mellitus with other specified complication: Secondary | ICD-10-CM

## 2021-05-22 DIAGNOSIS — M79675 Pain in left toe(s): Secondary | ICD-10-CM | POA: Diagnosis not present

## 2021-05-22 DIAGNOSIS — N189 Chronic kidney disease, unspecified: Secondary | ICD-10-CM

## 2021-05-22 DIAGNOSIS — M79674 Pain in right toe(s): Secondary | ICD-10-CM

## 2021-05-22 DIAGNOSIS — E1149 Type 2 diabetes mellitus with other diabetic neurological complication: Secondary | ICD-10-CM

## 2021-05-22 DIAGNOSIS — B351 Tinea unguium: Secondary | ICD-10-CM | POA: Diagnosis not present

## 2021-05-22 DIAGNOSIS — E669 Obesity, unspecified: Secondary | ICD-10-CM

## 2021-05-22 NOTE — Telephone Encounter (Signed)
Pt is returning a call from earlier today. Please advise

## 2021-05-22 NOTE — Telephone Encounter (Signed)
Called patient, advised of last OV from Dr.Kelly to take Lasix every 2-3 days to see if it would help. He states he has not taken it since after his visit with Dr.Kelly. he will try this and see if it helps the swelling and SOB that he is having. He is not having any chest pain. He states that he just has a headache, and neuropathy issues. I advised that if there was no improvement with the lasix to call back. Patient states he will do this. Patient was just seen in office and had complaints of same symptoms per Dr.Kelly's note. Patient advised if any new or worsening symptoms to be seen. Patient verbalized understanding.

## 2021-05-22 NOTE — Telephone Encounter (Signed)
Left message to call back  

## 2021-05-22 NOTE — Telephone Encounter (Signed)
   Pt c/o swelling: STAT is pt has developed SOB within 24 hours  How much weight have you gained and in what time span? none  If swelling, where is the swelling located? Right leg  Are you currently taking a fluid pill? Yes but not today  Are you currently SOB? yes  Do you have a log of your daily weights (if so, list)? no  Have you gained 3 pounds in a day or 5 pounds in a week? no  Have you traveled recently? no   Patient's also been experiencing headache, neuropathy getting worse, and sleeping a lot. Please advise

## 2021-05-22 NOTE — Progress Notes (Signed)
This patient returns to my office for at risk foot care.  This patient requires this care by a professional since this patient will be at risk due to having uncontrolled diabetes and kidney transplant.  This patient is unable to cut nails himself since the patient cannot reach his nails.These nails are painful walking and wearing shoes.  Resolved heel fissure.  Ulcer right hallux being treated by Dr.  Amalia Hailey. This patient presents for at risk foot care today.  General Appearance  Alert, conversant and in no acute stress.  Vascular  Dorsalis pedis and posterior tibial  pulses are palpable  bilaterally.  Capillary return is within normal limits  bilaterally. Temperature is within normal limits  bilaterally.  Neurologic  Senn-Weinstein monofilament wire test diminished   bilaterally. Muscle power within normal limits bilaterally.  Nails Thick disfigured discolored nails with subungual debris  from hallux to fifth toes bilaterally. No evidence of bacterial infection or drainage bilaterally.  Orthopedic  No limitations of motion  feet .  No crepitus or effusions noted.  No bony pathology or digital deformities noted. HAV  B/L.  Skin  normotropic skin with no porokeratosis noted bilaterally.  No signs of infections or ulcers noted.   Fissures left heel.  Onychomycosis  Pain in right toes  Pain in left toes  Fissures left heel.  Consent was obtained for treatment procedures.   Mechanical debridement of nails 1-5  bilaterally performed with a nail nipper.  Filed with dremel without incident.     Return office visit  3 months                   Told patient to return for periodic foot care and evaluation due to potential at risk complications.   Gardiner Barefoot DPM

## 2021-05-24 ENCOUNTER — Ambulatory Visit: Payer: 59 | Admitting: Podiatry

## 2021-05-25 ENCOUNTER — Other Ambulatory Visit: Payer: 59

## 2021-05-26 NOTE — Telephone Encounter (Signed)
Pt had forgotten paperwork for support stocking information. Nurse was able to call patient while he was still in office, pt picked up paperwork. All concerns addressed at this time.

## 2021-05-31 ENCOUNTER — Ambulatory Visit: Payer: 59 | Admitting: Endocrinology

## 2021-06-08 ENCOUNTER — Telehealth: Payer: Self-pay | Admitting: Cardiovascular Disease

## 2021-06-08 NOTE — Telephone Encounter (Signed)
Robert [name] is calling in regards to getting his Heart Cath Scheduled ASAP. Please advise the pt further

## 2021-06-08 NOTE — Telephone Encounter (Signed)
Try to move to October-  Dr.Kelly going out of town, not doing caths until then and will need to be seen again.

## 2021-06-08 NOTE — Telephone Encounter (Signed)
I contacted patient to discuss the need for a heart cath, per last note with [facility] he was awaiting approval from his reevaluation from [place] on his transplant. He was going to be seen in November to discuss possible cath at this visit. Patient states that he was told per [facility] he could have the CATH but he needed to hydrate before. I advised I would have to speak with Dr.Kelly in regards to this and would let him know, if he gave the okay I could help get him set up, but would have to get Dr.Kelly's recommendations.   Patient states that he just wants to get this over with before any of the holidays so he can enjoy them.

## 2021-06-09 NOTE — Telephone Encounter (Signed)
Called patient, advised of discussion with Dr.Kelly- he was moved up to October appointment to discuss CATH.  Patient verbalized understanding.

## 2021-06-10 ENCOUNTER — Other Ambulatory Visit: Payer: Self-pay | Admitting: Endocrinology

## 2021-06-16 ENCOUNTER — Other Ambulatory Visit: Payer: Self-pay

## 2021-06-16 ENCOUNTER — Telehealth: Payer: Self-pay | Admitting: Cardiovascular Disease

## 2021-06-16 MED ORDER — METOPROLOL TARTRATE 50 MG PO TABS
50.0000 mg | ORAL_TABLET | Freq: Two times a day (BID) | ORAL | 3 refills | Status: DC
Start: 2021-06-16 — End: 2021-10-26

## 2021-06-16 NOTE — Telephone Encounter (Signed)
*  STAT* If patient is at the pharmacy, call can be transferred to refill team.   1. Which medications need to be refilled? (please list name of each medication and dose if known) New prescription for Metoprolol  2. Which pharmacy/location (including street and city if local pharmacy) is medication to be sent to?[facility], [place]  3. Do they need a 30 day or 90 day supply? 90 mg and refills

## 2021-06-20 ENCOUNTER — Encounter (HOSPITAL_COMMUNITY): Payer: Self-pay

## 2021-06-20 ENCOUNTER — Other Ambulatory Visit (HOSPITAL_COMMUNITY): Payer: Self-pay

## 2021-06-20 ENCOUNTER — Telehealth: Payer: Self-pay | Admitting: Pharmacy Technician

## 2021-06-20 NOTE — Telephone Encounter (Signed)
Patient Advocate Encounter   Received notification from patient that his medications are getting too expensive and he is requesting copay cards. Several of them do not have copay cards. Humulin R u-500 is a $100 per month copay, based on my test claim, so I submitted a tier exception to see if they will lower the cost. Pt had a kidney transplant yesterday.  Pt should be able to get a copay card for his novolog that could save him $25 a month.    PA submitted on 06/20/2021 Key [id number] Status is pending    [facility] will continue to follow:   Armanda Magic, CPhT Patient Advocate [facility] Phone: [phone] Fax:  [phone]

## 2021-06-21 ENCOUNTER — Telehealth: Payer: Self-pay | Admitting: Nutrition

## 2021-06-21 NOTE — Telephone Encounter (Signed)
Patient is wanting copay card for Novolog and all of the drugs Dr. Dwyane Dee has put him on.  I told him that you have a call into [facility] for his copay card, and will let him know

## 2021-06-23 NOTE — Telephone Encounter (Signed)
Dr Dwyane Dee sent a message to patient in [place] letting him know that there are no copay cards for any of those medications.

## 2021-06-26 ENCOUNTER — Other Ambulatory Visit (HOSPITAL_COMMUNITY): Payer: Self-pay

## 2021-06-27 NOTE — Telephone Encounter (Signed)
[facility] Patient Advocate Encounter  Received notification from [place] that the request for a tier exception for Humulin R U-500 has been denied due to med being in the lowest co-pay tier already.

## 2021-06-27 NOTE — Telephone Encounter (Signed)
Called patient to give phone # [phone] for copay card for the novolog flexpen. No answer left vm with this information. Per Dr Dwyane Dee no copay cards for the other medications.

## 2021-07-04 ENCOUNTER — Other Ambulatory Visit (INDEPENDENT_AMBULATORY_CARE_PROVIDER_SITE_OTHER): Payer: 59

## 2021-07-04 ENCOUNTER — Other Ambulatory Visit: Payer: 59

## 2021-07-04 DIAGNOSIS — E039 Hypothyroidism, unspecified: Secondary | ICD-10-CM

## 2021-07-04 DIAGNOSIS — Z794 Long term (current) use of insulin: Secondary | ICD-10-CM | POA: Diagnosis not present

## 2021-07-04 DIAGNOSIS — E782 Mixed hyperlipidemia: Secondary | ICD-10-CM | POA: Diagnosis not present

## 2021-07-04 DIAGNOSIS — E1165 Type 2 diabetes mellitus with hyperglycemia: Secondary | ICD-10-CM

## 2021-07-04 LAB — BASIC METABOLIC PANEL
BUN: 31 mg/dL — ABNORMAL HIGH (ref 6–23)
CO2: 23 mEq/L (ref 19–32)
Calcium: 9.4 mg/dL (ref 8.4–10.5)
Chloride: 106 mEq/L (ref 96–112)
Creatinine, Ser: 1.93 mg/dL — ABNORMAL HIGH (ref 0.40–1.50)
GFR: 36.74 mL/min — ABNORMAL LOW (ref >60.00)
Glucose, Bld: 95 mg/dL (ref 70–99)
Potassium: 3.7 mEq/L (ref 3.5–5.1)
Sodium: 140 mEq/L (ref 135–145)

## 2021-07-04 LAB — LIPID PANEL
Cholesterol: 148 mg/dL (ref 0–200)
HDL: 69.7 mg/dL (ref >39.00)
LDL Cholesterol: 67 mg/dL (ref 0–99)
NonHDL: 77.93
Total CHOL/HDL Ratio: 2
Triglycerides: 54 mg/dL (ref 0.0–149.0)
VLDL: 10.8 mg/dL (ref 0.0–40.0)

## 2021-07-04 LAB — TSH: TSH: 1.15 u[IU]/mL (ref 0.35–5.50)

## 2021-07-05 LAB — FRUCTOSAMINE: Fructosamine: 296 umol/L — ABNORMAL HIGH (ref 0–285)

## 2021-07-10 ENCOUNTER — Ambulatory Visit (INDEPENDENT_AMBULATORY_CARE_PROVIDER_SITE_OTHER): Payer: 59 | Admitting: Endocrinology

## 2021-07-10 ENCOUNTER — Other Ambulatory Visit: Payer: Self-pay

## 2021-07-10 ENCOUNTER — Encounter: Payer: Self-pay | Admitting: Endocrinology

## 2021-07-10 VITALS — BP 142/78 | HR 75 | Ht 71.0 in | Wt 294.2 lb

## 2021-07-10 DIAGNOSIS — E039 Hypothyroidism, unspecified: Secondary | ICD-10-CM | POA: Diagnosis not present

## 2021-07-10 DIAGNOSIS — Z794 Long term (current) use of insulin: Secondary | ICD-10-CM

## 2021-07-10 DIAGNOSIS — E291 Testicular hypofunction: Secondary | ICD-10-CM

## 2021-07-10 DIAGNOSIS — E1165 Type 2 diabetes mellitus with hyperglycemia: Secondary | ICD-10-CM

## 2021-07-10 DIAGNOSIS — E782 Mixed hyperlipidemia: Secondary | ICD-10-CM

## 2021-07-10 LAB — POCT GLYCOSYLATED HEMOGLOBIN (HGB A1C): Hemoglobin A1C: 8.1 % — AB (ref 4.0–5.6)

## 2021-07-10 MED ORDER — TESTOSTERONE CYPIONATE 200 MG/ML IM SOLN: 120.0000 mg | INTRAMUSCULAR | 1 refills | Status: DC

## 2021-07-10 NOTE — Progress Notes (Signed)
Patient ID: Robert [name], male   DOB: May 29, 1961, 60 y.o.   MRN: [id number]     Reason for Appointment: Endocrinology follow-up  History of Present Illness   DIABETES diagnosis date:  2012  Previous history: His blood sugar has been difficult to control since onset. Initially was taking oral hypoglycemic drugs like glyburide but could not take metformin because of renal dysfunction. Since Victoza did not help his control he was started on Lantus and then U 500 insulin also His A1c has previously ranged from 7.8-9.8, the lowest level in 03/2013 He was on Victoza but because of his markedly decreased appetite and weight loss this was stopped in 9/15  Recent history:   Non-hypoglycemic insulin regimen: was on Victoza 1.8 mg daily  His A1c is 8.1  Fructosamine is relatively higher also at 296  OMNIPOD pump settings with U-500 insulin:  Basal rate 12 AM = 0.3, 5 AM 12 noon = 0.7, 12 PM-10 PM = = 0.7 and 10 PM = 0.5  Carbohydrate ratio 1:10, correction factor I: 60  Bolus insulin with NovoLog FlexPen variable doses 10-15, currently none  Current blood sugar patterns, management and problems identified:   He has stopped using NovoLog because of high co-pay a couple of months ago  However he says that he is now able to afford the U-500  Continues to have only intermittent monitoring with his Accu-Chek meter and generally not every day and mostly in the morning lately As before there is no consistent pattern of his blood sugars Most of his monitoring in the mornings recently is showing better blood sugars and lab glucose was 95 He does not take any boluses even though he is capable of doing so with his pump when he ran out of NovoLog  He also does not know how to program his pump with the OmniPod 4 system  This could not be programmed today because of his battery running out in the office  He was prescribed Ozempic on the last visit but did not pick it up, possibly  because of co-pay No exercise lately No symptoms of hypoglycemia Difficult to know how much NovoLog he was taking and how he was calculating it previously but mostly 10 to 15 units and only high amounts for high sugars previously had stopped Victoza which he thought was causing constipation   Proper timing of medications in relation to meals: Yes.          Monitors blood glucose:  1-2 times a day .    Glucometer: Accu-Chek         Blood Glucose readings from insulin pump monitoring frequently with recently good fasting readings, previously some high readings both morning and couple of high readings late evening AVERAGE 190 with MORNING average 177  Previously:  PRE-MEAL Morning Afternoon Evening Bedtime Overall  Glucose range: 69-279    61-397  Mean/median: 174 98 198  Median 152     Meals: 2-3 meals per day. 7-8 am and Noon and Dinner 5-7 PM .              Weight control:   Wt Readings from Last 3 Encounters:  07/10/21 294 lb 3.2 oz (133.4 kg)  05/16/21 292 lb 12.8 oz (132.8 kg)  03/28/21 299 lb 9.6 oz (607.3 kg)        Complications:   nephropathy, neuropathy   Diabetes labs:   Lab Results  Component Value Date   HGBA1C 7.5 (H) 03/23/2021  HGBA1C 6.9 (H) 10/27/2020   HGBA1C 8.6 (H) 05/31/2020   Lab Results  Component Value Date   MICROALBUR 146.3 (H) 08/03/2019   LDLCALC 67 07/04/2021   CREATININE 1.93 (H) 07/04/2021    Lab Results  Component Value Date   FRUCTOSAMINE 296 (H) 07/04/2021   FRUCTOSAMINE 276 12/13/2020   FRUCTOSAMINE 375 (H) 03/31/2020   Lab Results  Component Value Date   HGB 14.2 12/13/2020     Appointment on 07/04/2021  Component Date Value Ref Range Status   TSH 07/04/2021 1.15  0.35 - 5.50 uIU/mL Final   Fructosamine 07/04/2021 296 (A) 0 - 285 umol/L Final   Comment: Published reference interval for apparently healthy subjects between age 67 and 37 is 77 - 285 umol/L and in a poorly controlled diabetic population is 228 - 563  umol/L with a mean of 396 umol/L.    Sodium 07/04/2021 140  135 - 145 mEq/L Final   Potassium 07/04/2021 3.7  3.5 - 5.1 mEq/L Final   Chloride 07/04/2021 106  96 - 112 mEq/L Final   CO2 07/04/2021 23  19 - 32 mEq/L Final   Glucose, Bld 07/04/2021 95  70 - 99 mg/dL Final   BUN 07/04/2021 31 (A) 6 - 23 mg/dL Final   Creatinine, Ser 07/04/2021 1.93 (A) 0.40 - 1.50 mg/dL Final   GFR 07/04/2021 36.74 (A) >60.00 mL/min Final   Calculated using the CKD-EPI Creatinine Equation (2021)   Calcium 07/04/2021 9.4  8.4 - 10.5 mg/dL Final   Cholesterol 07/04/2021 148  0 - 200 mg/dL Final   ATP III Classification       Desirable:  < 200 mg/dL               Borderline High:  200 - 239 mg/dL          High:  > = 240 mg/dL   Triglycerides 07/04/2021 54.0  0.0 - 149.0 mg/dL Final   Normal:  <150 mg/dLBorderline High:  150 - 199 mg/dL   HDL 07/04/2021 69.70  >39.00 mg/dL Final   VLDL 07/04/2021 10.8  0.0 - 40.0 mg/dL Final   LDL Cholesterol 07/04/2021 67  0 - 99 mg/dL Final   Total CHOL/HDL Ratio 07/04/2021 2   Final                  Men          Women1/2 Average Risk     3.4          3.3Average Risk          5.0          4.42X Average Risk          9.6          7.13X Average Risk          15.0          11.0                       NonHDL 07/04/2021 77.93   Final   NOTE:  Non-HDL goal should be 30 mg/dL higher than patient's LDL goal (i.e. LDL goal of < 70 mg/dL, would have non-HDL goal of < 100 mg/dL)    OTHER problems: See review of systems    Allergies as of 07/10/2021   No Known Allergies      Medication List        Accurate as of July 10, 2021  1:45 PM. If you  have any questions, ask your nurse or doctor.          Accu-Chek [employer] Kit 1 each by Does not apply route daily.   Accu-Chek [employer]ix Lancets Misc Use lancets to check blood sugar 5 times daily.   amLODipine 10 MG tablet Commonly known as: NORVASC TAKE 1 TABLET BY MOUTH EVERY DAY   amoxicillin 500 MG  tablet Commonly known as: AMOXIL Take 1,000 mg by mouth 2 (two) times daily. Before dental visits   aspirin EC 81 MG tablet Take 81 mg by mouth daily.   B-D DISP NEEDLE 25GX1" 25G X 1" Misc Generic drug: NEEDLE (DISP) 25 G USE TO INJECT TESTOSTERONE (EXCHANGE NEEDLE FROM 22G 3ML SYRINGE)   B-D UF III MINI PEN NEEDLES 31G X 5 MM Misc Generic drug: Insulin Pen Needle USE 4 PER DAY TO INJECT INSULIN.   DULoxetine 60 MG capsule Commonly known as: CYMBALTA Take 60 mg by mouth daily.   Fifty50 Glucose Meter 2.0 w/Device Kit Use as directed   Accu-Chek Guide w/Device Kit 1 each by Does not apply route daily. Use meter to check blood sugar 5 times daily.   fluocinonide cream 0.05 % Commonly known as: LIDEX Apply 1 application topically daily as needed (Skin inflammation (hand, thigh, back)).   FreeStyle Libre 2 Sensor Misc 2 Devices by Does not apply route every 14 (fourteen) days.   furosemide 20 MG tablet Commonly known as: LASIX Take 20 mg by mouth daily. Takes As Needed   gabapentin 100 MG capsule Commonly known as: NEURONTIN Take 200-300 mg by mouth See admin instructions. 200 mg in the morning and 300 mg at bedtime   gentamicin cream 0.1 % Commonly known as: GARAMYCIN Apply 1 application topically 2 (two) times daily.   glucose blood test strip Commonly known as: Accu-Chek Guide 1 each by Other route as needed for other. Use as instructed to check blood sugar 5 times daily.   HUMULIN R 500 UNIT/ML injection Generic drug: insulin regular human CONCENTRATED USE MAX 200 UNITS IN PUMP EVERY THREE DAYS   Insulin Syringe-Needle U-100 31G X 5/16" 0.3 ML Misc Commonly known as: BD Insulin Syringe U/F Use to inject insulin 2-3 times per day   BD Insulin Syringe U/F 31G X 5/16" 1 ML Misc Generic drug: Insulin Syringe-Needle U-100 USE 3 TIMES A DAY WITH HUMULIN R   lactulose 10 GM/15ML solution Commonly known as: CHRONULAC Take 10 g by mouth daily as needed.    levothyroxine 100 MCG tablet Commonly known as: SYNTHROID TAKE 1 TABLET BY MOUTH EVERY DAY   lisinopril 20 MG tablet Commonly known as: ZESTRIL Take 20 mg by mouth 2 (two) times daily.   metoprolol tartrate 50 MG tablet Commonly known as: LOPRESSOR Take 1 tablet (50 mg total) by mouth 2 (two) times daily.   mycophenolate 250 MG capsule Commonly known as: CELLCEPT Take 1,000 mg by mouth every 12 (twelve) hours.   NovoLOG FlexPen 100 UNIT/ML FlexPen Generic drug: insulin aspart Inject 50 Units into the skin 3 (three) times daily with meals.   Nulojix 250 MG Solr injection Generic drug: belatacept Inject 24.5 mLs into the vein every 28 (twenty-eight) days.   Ozempic (0.25 or 0.5 MG/DOSE) 2 MG/1.5ML Sopn Generic drug: Semaglutide(0.25 or 0.5MG/DOS) Inject 0.5 mg into the skin once a week.   predniSONE 5 MG tablet Commonly known as: DELTASONE Take 5 mg by mouth daily with breakfast.   rosuvastatin 20 MG tablet Commonly known as: CRESTOR Take 20  mg by mouth at bedtime.   senna-docusate 8.6-50 MG tablet Commonly known as: Senokot-S Take 1 tablet by mouth as needed for mild constipation.   Syringe Luer Lock 3 ML Misc Use 1 syringe once weekly to inject Testosterone medication.   tamsulosin 0.4 MG Caps capsule Commonly known as: FLOMAX Take 0.8 mg by mouth at bedtime.   testosterone cypionate 200 MG/ML injection Commonly known as: DEPOTESTOSTERONE CYPIONATE INJECT 0.6 MLS (120 MG TOTAL) INTO THE MUSCLE EVERY 7 (SEVEN) DAYS.   traMADol 50 MG tablet Commonly known as: ULTRAM Take 1 tablet (50 mg total) by mouth every 6 (six) hours as needed. What changed: additional instructions   traZODone 50 MG tablet Commonly known as: DESYREL Take 50-100 mg by mouth at bedtime as needed for sleep.   UNABLE TO FIND BiPAP: At bedtime   zolpidem 10 MG tablet Commonly known as: AMBIEN Take 10-20 mg by mouth at bedtime as needed for sleep.        Allergies: No Known  Allergies  Past Medical History:  Diagnosis Date   Anemia    low hgb at present   Arthritis    FEET   Blood transfusion without reported diagnosis 1974   kidney removed - L   Cancer ([place])    skin ca right shoulder, plastic dsyplasia, pre-Ca polpys removed on Colonoscopy- 07/2014   Colon polyps 07/23/2014   Tubular adenomas x 5   Constipation    Diabetes mellitus without complication ([place])    Type 2   Difficult intubation    unsure of actual problem but it was during the January 28, 2015 procedure.   Dysrhythmia    Eczema    ESRD (end stage renal disease) (H[facility])    hEMO mwf   Headache(784.0)    slight dull h/a due kidney failure   Hyperlipidemia    Hypertension    SVT h/o , followed by Dr. Claiborne Billings   Hypothyroidism    Macular degeneration    Neuropathy    right hand and both feet   PAT (paroxysmal atrial tachycardia) (HCC)    PSVT (paroxysmal supraventricular tachycardia) (HCC)    SBO (small bowel obstruction) (HCC)    Shortness of breath    with exertion   Sleep apnea 06/18/11   split-night sleep study- [facility]., BiPAP- 13-16   Thyroid disease    Umbilical hernia    will repair with thi surgery    Past Surgical History:  Procedure Laterality Date   AV FISTULA PLACEMENT Right 02/25/2015   Procedure: RIGHT ARTERIOVENOUS (AV) FISTULA CREATION;  Surgeon: Serafina Mitchell, MD;  Location: [place];  Service: Vascular;  Laterality: Right;   AV FISTULA PLACEMENT Right 09/15/2015   Procedure: ARTERIOVENOUS (AV) FISTULA CREATION- RIGHT ARM;  Surgeon: Serafina Mitchell, MD;  Location: [place];  Service: Vascular;  Laterality: Right;   CAPD INSERTION N/A 05/18/2014   Procedure: LAPAROSCOPIC INSERTION CONTINUOUS AMBULATORY PERITONEAL DIALYSIS  (CAPD) CATHETER;  Surgeon: Ralene Ok, MD;  Location: [place];  Service: General;  Laterality: N/A;   COLONOSCOPY     COLONOSCOPY WITH PROPOFOL N/A 07/12/2016   Procedure: COLONOSCOPY WITH PROPOFOL;  Surgeon: [name],  MD;  Location: [facility];  Service: Endoscopy;  Laterality: N/A;   declotting of fistula  08/2015   ESOPHAGOGASTRODUODENOSCOPY (EGD) WITH PROPOFOL N/A 07/12/2016   Procedure: ESOPHAGOGASTRODUODENOSCOPY (EGD) WITH PROPOFOL;  Surgeon: [name], MD;  Location: [facility];  Service: Endoscopy;  Laterality: N/A;   FISTULA SUPERFICIALIZATION  Right 02/07/2016   Procedure: RIIGHT UPPER ARM FISTULA SUPERFICIALIZATION;  Surgeon: Elam Dutch, MD;  Location: [place];  Service: Vascular;  Laterality: Right;   IJ catheter insertion     INSERTION OF DIALYSIS CATHETER Right 02/25/2015   Procedure: INSERTION OF RIGHT INTERNAL JUGULAR DIALYSIS CATHETER;  Surgeon: Serafina Mitchell, MD;  Location: [place];  Service: Vascular;  Laterality: Right;   KIDNEY TRANSPLANT     LAPAROSCOPIC REPOSITIONING CAPD CATHETER N/A 06/16/2014   Procedure: LAPAROSCOPIC REPOSITIONING CAPD CATHETER;  Surgeon: Ralene Ok, MD;  Location: [place];  Service: General;  Laterality: N/A;   LAPAROSCOPY N/A 05/04/2015   Procedure: LAPAROSCOPY DIAGNOSTIC LYSIS OF ADHESIONS;  Surgeon: Coralie Keens, MD;  Location: [place];  Service: General;  Laterality: N/A;   MINOR REMOVAL OF PERITONEAL DIALYSIS CATHETER N/A 04/28/2015   Procedure:  REMOVAL OF PERITONEAL DIALYSIS CATHETER;  Surgeon: Coralie Keens, MD;  Location: [place];  Service: General;  Laterality: N/A;   NEPHRECTOMY Left 1974   RENAL BIOPSY Right 2012   REVISON OF ARTERIOVENOUS FISTULA Right 05/26/2015   Procedure: SUPERFICIALIZATION OF ARTERIOVENOUS FISTULA WITH SIDE BRANCH LIGATIONS;  Surgeon: Serafina Mitchell, MD;  Location: [place];  Service: Vascular;  Laterality: Right;   SKIN CANCER EXCISION     right shoulder   SVT ABLATION N/A 11/23/2016   Procedure: SVT Ablation;  Surgeon: Will Meredith Leeds, MD;  Location: [place] [facility];  Service: Cardiovascular;  Laterality: N/A;   UMBILICAL HERNIA REPAIR N/A 05/18/2014   Procedure: HERNIA REPAIR UMBILICAL ADULT;  Surgeon: Ralene Ok, MD;  Location: [place];  Service: General;  Laterality: N/A;   UMBILICAL HERNIA REPAIR N/A 04/28/2015   Procedure: UMBILICAL HERNIA REPAIR WITH MESH;  Surgeon: Coralie Keens, MD;  Location: [place];  Service: General;  Laterality: N/A;    Family History  Problem Relation Age of Onset   Colon polyps Father    Colon cancer Neg Hx    Stomach cancer Neg Hx     Social History:  reports that he quit smoking about 8 years ago. His smoking use included cigars. He has never used smokeless tobacco. He reports current alcohol use. He reports that he does not use drugs.  Review of Systems:  Hypertension:    His blood pressure has been managed by his nephrologist and transplant team monitors BP at home also  BP Readings from Last 3 Encounters:  07/10/21 (!) 142/78  05/16/21 110/88  03/28/21 (!) 148/94    Lipids:  His LDL is below 70 with 20 mg Crestor Has no evidence of vascular disease     Lab Results  Component Value Date   CHOL 148 07/04/2021   CHOL 147 08/31/2020   CHOL 158 07/07/2019   Lab Results  Component Value Date   HDL 69.70 07/04/2021   HDL 53.40 08/31/2020   HDL 47 07/07/2019   Lab Results  Component Value Date   LDLCALC 67 07/04/2021   LDLCALC 75 08/31/2020   LDLCALC 92 07/07/2019   Lab Results  Component Value Date   TRIG 54.0 07/04/2021   TRIG 97.0 08/31/2020   TRIG 93 07/07/2019   Lab Results  Component Value Date   CHOLHDL 2 07/04/2021   CHOLHDL 3 08/31/2020   CHOLHDL 3 04/29/2019   No results found for: LDLDIRECT   HYPOTHYROIDISM: He has had long-standing hypothyroidism  Has been taking his 100 g levothyroxine before breakfast daily  TSH normal consistently  Lab Results  Component Value Date  TSH 1.15 07/04/2021       HYPOGONADISM: 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  When testosterone level was low he was having fatigue and decreased motivation and because of his  he was prescribed He  has been using AndroGel since February 2019 but subsequently stopped this because of cost  Current regimen is Depo testosterone 120 mg weekly taking injections on Saturday Recently ran out of his refills   Pretreatment free testosterone level low at 3.7     Lab Results  Component Value Date   TESTOSTERONE 488.06 03/23/2021   TESTOSTERONE 643.62 12/13/2020   TESTOSTERONE 366.67 10/27/2020    He has depression, taking 30 mg of Cymbalta; followed by PCP  Diabetic neuropathy: Has mild sensory loss on his last exam Followed by podiatrist    Examination:   BP (!) 142/78   Pulse 75   Ht _0  (1.803 m)   Wt 294 lb 3.2 oz (133.4 kg)   SpO2 98%   BMI 41.03 kg/m   Body mass index is 41.03 kg/m.    ASSESSMENT/ PLAN:    Diabetes type 2 with obesity, insulin-requiring:   See history of present illness for detailed discussion of his current management, problems identified and sugar patterns on his meter download  Has been on basal insulin on his OmniPod pump with U-500 and mealtime insulin using NovoLog FlexPen   A1c is now 8.1  With intermittent glucose monitoring and lack of bolus insulin in the last couple of months he has poor control  Difficult assess blood sugar patterns  He is not wanting to consider using the freestyle libre or Dexcom because of cost  Most of his treatment is limited by cost concerns However he thinks he can continue using the OmniPod with a U-500 insulin, current basal insulin usage is about 14 units   Start taking BOLUSES with the OmniPod pump using his U-500 insulin Insulin pump changes will be made to reduce overnight basal rate when he starts using the U-500 insulin This will be done when he is able to reach out his pump PDM New settings will be 0.2 at midnight and 0.4 at 10 PM with carbohydrate coverage 1: 12 and target 130 Start checking blood sugars up to 4 times a day including before and after meals  .  Bolus 30-minute before  eating   Depo testosterone to be restarted and prescription given with the dosage 0.6 mL weekly  HYPOTHYROIDISM: Showing adequate supplementation and TSH is again normal  LIPIDS: Well-controlled with atorvastatin  There are no Patient Instructions on file for this visit.     Elayne Snare 07/10/2021, 1:45 PM

## 2021-07-10 NOTE — Patient Instructions (Addendum)
Check blood sugars on waking up 7 days a week  Also check blood sugars about 2 hours after meals and do this after different meals by rotation  Recommended blood sugar levels on waking up are 90-130 and about 2 hours after meal is 130-180  Please bring your blood sugar monitor to each visit, thank you  Bolus for ALL MEALS WITH U-500

## 2021-07-11 ENCOUNTER — Telehealth: Payer: Self-pay | Admitting: Nutrition

## 2021-07-11 ENCOUNTER — Other Ambulatory Visit (HOSPITAL_COMMUNITY): Payer: Self-pay

## 2021-07-11 ENCOUNTER — Ambulatory Visit (INDEPENDENT_AMBULATORY_CARE_PROVIDER_SITE_OTHER): Payer: 59 | Admitting: Cardiovascular Disease

## 2021-07-11 ENCOUNTER — Telehealth: Payer: Self-pay | Admitting: Pharmacy Technician

## 2021-07-11 ENCOUNTER — Encounter: Payer: Self-pay | Admitting: Cardiovascular Disease

## 2021-07-11 DIAGNOSIS — Z940 Kidney transplant status: Secondary | ICD-10-CM

## 2021-07-11 DIAGNOSIS — E785 Hyperlipidemia, unspecified: Secondary | ICD-10-CM

## 2021-07-11 DIAGNOSIS — Z794 Long term (current) use of insulin: Secondary | ICD-10-CM

## 2021-07-11 DIAGNOSIS — Z9989 Dependence on other enabling machines and devices: Secondary | ICD-10-CM

## 2021-07-11 DIAGNOSIS — N1832 Chronic kidney disease, stage 3b: Secondary | ICD-10-CM

## 2021-07-11 DIAGNOSIS — I10 Essential (primary) hypertension: Secondary | ICD-10-CM

## 2021-07-11 DIAGNOSIS — G4733 Obstructive sleep apnea (adult) (pediatric): Secondary | ICD-10-CM

## 2021-07-11 DIAGNOSIS — E118 Type 2 diabetes mellitus with unspecified complications: Secondary | ICD-10-CM

## 2021-07-11 DIAGNOSIS — I350 Nonrheumatic aortic (valve) stenosis: Secondary | ICD-10-CM

## 2021-07-11 NOTE — Patient Instructions (Signed)
Medication Instructions:  The current medical regimen is effective;  continue present plan and medications.  *If you need a refill on your cardiac medications before your next appointment, please call your pharmacy*   Lab Work: CBC, CMET in 2 weeks. ( No lab appointment needed)  If you have labs (blood work) drawn today and your tests are completely normal, you will receive your results only by: Robert [name] (if you have MyChart) OR A paper copy in the mail If you have any lab test that is abnormal or we need to change your treatment, we will call you to review the results.  Follow-Up: At [facility], you and your health needs are our priority.  As part of our continuing mission to provide you with exceptional heart care, we have created designated Provider Care Teams.  These Care Teams include your primary Cardiologist (physician) and Advanced Practice Providers (APPs -  Physician Assistants and Nurse Practitioners) who all work together to provide you with the care you need, when you need it.  We recommend signing up for the patient portal called "MyChart".  Sign up information is provided on this After Visit Summary.  MyChart is used to connect with patients for Virtual Visits (Telemedicine).  Patients are able to view lab/test results, encounter notes, upcoming appointments, etc.  Non-urgent messages can be sent to your provider as well.   To learn more about what you can do with MyChart, go to [email or web address].    Your next appointment:   November 28th at 10:40 AM

## 2021-07-11 NOTE — Telephone Encounter (Signed)
Patient Advocate Encounter   Received notification from CoverMyMeds that prior authorization for Testosterone 200mg /ml is required.   PA submitted on 07/11/21 Key [id number]  Prior Authorization has been approved.    PA# PA Case ID: [id number] Effective dates: 07/11/21 through 07/11/22  Patients co-pay is $15.   Armanda Magic, CPhT [facility] Phone: [phone] Fax:  [phone]

## 2021-07-11 NOTE — Progress Notes (Signed)
Patient ID: CLEMENCE STILLINGS, male   DOB: 27-Sep-1961, 60 y.o.   MRN: [id number]     HPI: DEVONTAYE GROUND, is a 60 y.o. male who is a former patient of Dr. Terance Ice. He presents for a 2 month follow-up evaluation.  Mr. Teegan Brandis  has a complex medical history that includes a congenital nonfunctioning left kidney for which he underwent left nephrectomy at age 64. He has a history of obesity, diabetes mellitus, and has been demonstrated to have proteinuria of his remaining single kidney, being status post open renal biopsy in [name] in November 2010. This revealed focal segmental glomerular sclerosis. At that time he was seen by Dr. Venita Sheffield in the nephrology department at [facility]. He sees Dr. Moshe Cipro in [place] for his kidney insufficiency and has been on ARB therapy in the past in an attempt to reduce proteinuria. He sees Dr. Dwyane Dee for his diabetes mellitus. He also has a history of hypothyroidism, hyperlipidemia, anxiety and depression. He has a history of prior supraventricular tachycardia requiring hospitalization in 2007 as was felt to be possibly AV nodal reentrant rhythm.  He has a history of complex sleep apnea initially diagnosed in 2007 with an AHI at that time of 56.7 per hour and during REM sleep this increased to 65.7 per hour. At that time he dropped his oxygen 84% and had loud snoring. He had been on BiPAP therapy. He underwent a 5 year followup split-night study in 2012 which again confirmed severe sleep apnea with an AHI of 60.7 on the baseline portion of the study. He was unable to achieve REM sleep at that time. Oxygen saturation during non-REM sleep at 83%. He was titrated utilizing BiPAP up to a setting of 16/12 cm water pressure. He does admit to continued residual daytime sleepiness despite using his BiPAP therapy.  An echo Doppler study in August 2012 showed mild concentric LVH with normal systolic function and grade 1  diastolic dysfunction. He had trace mitral and tricuspid insufficiency as well as pulmonic insufficiency. A nuclear perfusion study in August 2012 showed normal perfusion.  Mr. Homes was on dialysis Monday, [name].  He is on the [facility] transplant list.  In January 2017 he had an stress echo at [facility] as part of his transplant evaluation.  I do not have the results of this evaluation.  He has had some difficulty with a steal syndrome in his right hand since he had AV fistulas inserted in the forearm and most recently now in the upper arm.  He continues to use BiPAP with 100% compliance.  He had a [name].  He obtains his CPAP supplies online because they are cheaper, but had seen [facility].  He is using a Art therapist full face mask.  A download in the office from 05/28/2016 through 07/26/2016.  He is meeting compliance standards with usage at 85% usage stays greater than 4 hours at 77%.  He is averaging 6 hours and 43 minutes per night.  He is set at an IPAP pressure of 16 and EPAP pressure of 12.  His AHI was mildly increased at 6.5 with 5.2 per hour of apneic events and 1.3 of hypopnea events.  When I last saw him he had stopped taken Bystolic since his blood pressure had become low at the end of dialysis.  He had been on 5 mg on nondialysis days.  He tells me his aspirin was discontinued because he was  continuously having issues with bleeding following his dialysis when the catheter was removed.   He had an episode of SVT in December 2017, which was successfully aborted with vagal maneuvers.  In the emergency room, he had 2-3 more recurrences of SVT, of which, again, all aborted with vagal maneuvers.  He was started on verapamil and discharge from the hospital.  He has seen Dr. Curt Bears and had an EP study with attempted ablation, but apparently he was not able to be induced and ablation was not performed.   He denies any recurrent episodes of SVT.  Remotely, he  may of had some mild nonexertional musculoskeletal discomfort but has not experienced any of this recently.  Reportedly, his remote stress echo was negative for ischemia, although mild ST changes were noted in recovery.  He continues to be asymptomatic.  He underwent a kidney transplant from a living donor on 02/25/2017 at [facility].  He tolerated surgery well but has developed a postoperative inguinal hernia, which will ultimately need to be surgically repaired.  He received a new BiPAP machine from [facility] 2 weeks ago.  He has a full face mask, but admits to increased leak.    Since I saw him in September 2018, he had 2 hospital stays at [facility].  He developed intestinal bleeding due to diverticular disease and also had some dehydration issues.  He also underwent inguinal hernia surgery.  He denies any recent chest pain or palpitations.  He continues to use BiPAP with 100% compliance.  However, he did not have his machine with him for the days he was in the hospital.  He brought his machine with him today for interrogation.  Over the past month, usage hours and 6 hours and 32 minutes per night with an HI of 0.9.  He used to machine 27 of 30 nights nights that were not used was because he was in the hospital.  Usage greater than 4 hours was 26 of 30.  He is on a BiPAP pressure of 18/13 0.9.  Recently he has noticed a mild leak.  AHI was 5.6 over the past 7 days.  He has a ResMed fullface mask, Mirage Quatro mask.    When I saw him in November 2018, he was feeling well. He is seeing Dr. Azzie Glatter at [facility] from a nephrology standpoint.  He denies any episodes of chest pain or awareness of recurrent arrhythmia.  He admits to 100% use with CPAP.  [name] is his DME company.  He has a ResMed air 10 V auto unit with an EPAP pressure 14.  An IPAP pressure set at 18.  A download from 01/14/2017 through 02/12/2018 shows 100% compliance.  He is averaging 8 hours and 7 minutes of sleep.  AHI is excellent at  2.6.    I saw him in May 2019 and since that time, he has continued to follow at [facility]. He was last evaluated by me in a telemedicine visit in April 2020.  At that time he had noticed that over the past year  his blood pressure had increased and his lisinopril dose was increased to 20 mg and amlodipine reduced to 5 mg.  His weight had fluctuated and has increased from 293 to 303.  He has continued to use BiPAP with 100% compliance.  I was able to obtain a download today from March 23 through January 27, 2019.  He is 100% compliant with average BiPAP use of 7 hours and 10 minutes.  At  his 18/14 pressure, AHI remains excellent at 2.6/h.  Over the past 6 months he has changed jobs twice.  He is been staying home particularly with his significant comorbidities which can be affected by the coronavirus including hypertension, diabetes mellitus, obesity, renal disease, and immunosuppression.  He walks approximately 2 times a day for short duration.  He receives immunosuppressant infusion with [employer] and is scheduled for his next infusion: On Feb 12, 2019.  His last lipid panel was in October 2019 with total cholesterol 137 and LDL cholesterol 73.  He is scheduled to see his nephrologist in August.    I saw him in the office in February 2021 and since his  telemedicine evaluation in April 2020, he continued to be followed closely at [facility].  Creatinine had increased to 1.74.  He has noticed blood pressure elevation.  He has not been successful with weight loss.  He has been without anginal symptoms or awareness of recurrent SVT.  He has continued to use BiPAP therapy.  A download from 10/22/2019 through 11/20/1999 shows 93% of usage days.  Average use is 8 hours and 10 minutes.  At his pressure of 18/14, AHI is excellent at 1.5.  There is no mask leak.  During his February 2021 evaluation he had a 2/6 murmur consistent with aortic stenosis and I recommended follow-up echo Doppler evaluation.   He underwent an echo Doppler  study on December 08, 2019 which showed an EF of 65 to 70%, moderate left ventricular hypertrophy and grade 1 diastolic dysfunction.  There was mild mitral valve regurgitation.  There was moderate to severe aortic stenosis with a mean gradient of 39 peak gradient of 58.   He was evaluated in a telemedicine visit in June 2021.  At that time he denied any chest pain, presyncope or syncope.  He continues to go to [facility] for nephrology follow-up.  He states his blood pressure is stable every morning upon awakening but as a day progresses it may go up slightly.  He denies any exertional shortness of breath.  At times he has noticed some mild lower extremity edema.  He was evaluated at [facility] in July 2021 and was hospitalized overnight with right lower quadrant pain around his renal allograft.  A CT scan demonstrated normal-appearing transplant kidney without any acute abdominal/pelvic abnormalities however, significant stool burden was noted on CT suggesting moderate to severe constipation.  I last saw him in December 2021 at which time he denied any chest pain but had experienced some mild shortness of breath particularly with walking up steps.Marland Kitchen He is now on insulin pump.  He has noticed some lower extremity edema.  He continued to use BiPAP.  A download was obtained in the office today from November 7 through September 12, 2020 which shows excellent compliance.  There were only 2 days of nonuse when he never went to bed and it stayed in a recliner.  He is on BiPAP with a pressure of 18/14.  He is averaging 8 hours and 54 minutes of BiPAP use per night.  AHI is excellent at 2.0.  He does have mask leak and has been noticing a leak in the region of his chin.    I recommended he have a follow-up echo Doppler study which was done on November 16, 2020.  This showed continued hyperdynamic LV function with EF at 65 to 70% and moderate LVH with grade 2 diastolic dysfunction.  RV size was normal.  Moderately elevated pulmonary  artery systolic  pressure at 45 mm.  There was mild mitral stenosis with a mean valve gradient of 3.2 mm with moderately severe mitral annular calcification.  There was moderate calcification of his trileaflet aortic valve.  He was felt to have moderate aortic stenosis with a mean gradient of 34.6 and peak gradient of 57 with calculated valve area 1.05 cm.  I saw him on December 01, 2020 at which time he continued to feel well but was experiencing some some leg edema and  shortness of breath with activity.  He is followed closely at [facility] for his renal transplant.  Most recent creatinine on October 27, 2020 was further increased at 1.89.  Hemoglobin A1c was 6.9. He denies palpitations.  He continues to use BiPAP.  I obtained a new download in the office today from January 25 through November 30, 2020.  Compliance is excellent with average use at 10 hours and 16 minutes per night.  At his BiPAP pressure of 18/14, AHI is 2.3.  There are nights with significant mask leak.  During that evaluation, I recommended that he undergo a follow-up echo Doppler study for reassessment of his aortic valve which may be progressing and it stenosis.  When I saw him on August 9,2022 he had recently noticed some slight increase shortness of breath with exertion.  He denied any presyncope or syncope.  However at times when his blood pressure gets too low he does note transient dizziness.  He admitted to swelling in his lower extremities right leg greater than left and has taken as needed furosemide but has not taken this recently.  He wass scheduled to undergo a renal transplant reevaluation in October at [facility].  He denied any chest pain.  He denies any palpitations.  He underwent an echo Doppler study on May 02, 2021.  EF remained stable at 60 to 65%.  There was moderate left ventricular hypertrophy and there was grade 2 diastolic dysfunction.  There was moderate left atrial dilatation.  There was moderate calcification of a  trileaflet aortic valve with moderately severe aortic stenosis.  The mean gradient had now increased to 44.4 and the peak instantaneous gradient to 64 mmHg.  The aortic valve area (VTI) was 0.81 cm.  There was mild dilation of the ascending aorta at 42 mm.  He continues to use BiPAP with 100% compliance.  A new download was obtained from July 10 through May 15, 2021 and average use is 8 hours and 15 minutes per night.  His BiPAP settings are 18/14 and AHI is excellent at 1.6/h.  During that evaluation, I discussed with him that with his progressive aortic valve stenosis he may ultimately require definitive cardiac catheterization and will need extensive CT imaging for potential candidacy for TAVR.  At that time I discussed with him the importance of being seen by his [facility] transplant team.  I also suggested he wear support stockings at least 20 to 30 mg and as needed to take furosemide 20 mg possibly daily or every other 2 to 3 days but depending upon his leg swelling.  Since I saw him, he had apparently contacted [facility] verbally and was told that if catheterization was necessary he would need to undergo go adequate hydration.  Apparently, he subsequently developed a cough on September 24 through 25 that was productive with some purulent sputum.  He was seen by Dr. Osborne Casco.  A chest x-ray reportedly suggested bilateral pneumonia.  He tested negative for COVID and flu.  He was empirically started  on cefdinir yesterday for a 10-day course of antibiotics.  He underwent follow-up laboratory on July 04, 2021.  His creatinine had risen to 1.93 on July 04, 2021 which had increased from 1.77 on March 23, 2021 and from 1.69 on December 13, 2020.  He presents for follow-up evaluation.   Past Medical History:  Diagnosis Date   Anemia    low hgb at present   Arthritis    FEET   Blood transfusion without reported diagnosis 1974   kidney removed - L   Cancer ([place])    skin ca right shoulder, plastic dsyplasia,  pre-Ca polpys removed on Colonoscopy- 07/2014   Colon polyps 07/23/2014   Tubular adenomas x 5   Constipation    Diabetes mellitus without complication ([place])    Type 2   Difficult intubation    unsure of actual problem but it was during the January 28, 2015 procedure.   Dysrhythmia    Eczema    ESRD (end stage renal disease) (HCC)    hEMO mwf   Headache(784.0)    slight dull h/a due kidney failure   Hyperlipidemia    Hypertension    SVT h/o , followed by Dr. Claiborne Billings   Hypothyroidism    Macular degeneration    Neuropathy    right hand and both feet   PAT (paroxysmal atrial tachycardia) (HCC)    PSVT (paroxysmal supraventricular tachycardia) (HCC)    SBO (small bowel obstruction) (HCC)    Shortness of breath    with exertion   Sleep apnea 06/18/11   split-night sleep study- [facility]., BiPAP- 13-16   Thyroid disease    Umbilical hernia    will repair with thi surgery    Past Surgical History:  Procedure Laterality Date   AV FISTULA PLACEMENT Right 02/25/2015   Procedure: RIGHT ARTERIOVENOUS (AV) FISTULA CREATION;  Surgeon: Serafina Mitchell, MD;  Location: [place];  Service: Vascular;  Laterality: Right;   AV FISTULA PLACEMENT Right 09/15/2015   Procedure: ARTERIOVENOUS (AV) FISTULA CREATION- RIGHT ARM;  Surgeon: Serafina Mitchell, MD;  Location: [place];  Service: Vascular;  Laterality: Right;   CAPD INSERTION N/A 05/18/2014   Procedure: LAPAROSCOPIC INSERTION CONTINUOUS AMBULATORY PERITONEAL DIALYSIS  (CAPD) CATHETER;  Surgeon: Ralene Ok, MD;  Location: [place];  Service: General;  Laterality: N/A;   COLONOSCOPY     COLONOSCOPY WITH PROPOFOL N/A 07/12/2016   Procedure: COLONOSCOPY WITH PROPOFOL;  Surgeon: [name], MD;  Location: [facility];  Service: Endoscopy;  Laterality: N/A;   declotting of fistula  08/2015   ESOPHAGOGASTRODUODENOSCOPY (EGD) WITH PROPOFOL N/A 07/12/2016   Procedure: ESOPHAGOGASTRODUODENOSCOPY (EGD) WITH PROPOFOL;  Surgeon: [name], MD;  Location: [facility];  Service: Endoscopy;  Laterality: N/A;   FISTULA SUPERFICIALIZATION Right 02/07/2016   Procedure: RIIGHT UPPER ARM FISTULA SUPERFICIALIZATION;  Surgeon: Elam Dutch, MD;  Location: [place];  Service: Vascular;  Laterality: Right;   IJ catheter insertion     INSERTION OF DIALYSIS CATHETER Right 02/25/2015   Procedure: INSERTION OF RIGHT INTERNAL JUGULAR DIALYSIS CATHETER;  Surgeon: Serafina Mitchell, MD;  Location: [place];  Service: Vascular;  Laterality: Right;   KIDNEY TRANSPLANT     LAPAROSCOPIC REPOSITIONING CAPD CATHETER N/A 06/16/2014   Procedure: LAPAROSCOPIC REPOSITIONING CAPD CATHETER;  Surgeon: Ralene Ok, MD;  Location: [place];  Service: General;  Laterality: N/A;   LAPAROSCOPY N/A 05/04/2015   Procedure: LAPAROSCOPY DIAGNOSTIC LYSIS OF ADHESIONS;  Surgeon: Coralie Keens, MD;  Location: [place];  Service: General;  Laterality: N/A;   MINOR REMOVAL OF PERITONEAL DIALYSIS CATHETER N/A 04/28/2015   Procedure:  REMOVAL OF PERITONEAL DIALYSIS CATHETER;  Surgeon: Coralie Keens, MD;  Location: [place];  Service: General;  Laterality: N/A;   NEPHRECTOMY Left 1974   RENAL BIOPSY Right 2012   REVISON OF ARTERIOVENOUS FISTULA Right 05/26/2015   Procedure: SUPERFICIALIZATION OF ARTERIOVENOUS FISTULA WITH SIDE BRANCH LIGATIONS;  Surgeon: Serafina Mitchell, MD;  Location: [place];  Service: Vascular;  Laterality: Right;   SKIN CANCER EXCISION     right shoulder   SVT ABLATION N/A 11/23/2016   Procedure: SVT Ablation;  Surgeon: Will Meredith Leeds, MD;  Location: [place] [facility];  Service: Cardiovascular;  Laterality: N/A;   UMBILICAL HERNIA REPAIR N/A 05/18/2014   Procedure: HERNIA REPAIR UMBILICAL ADULT;  Surgeon: Ralene Ok, MD;  Location: [place];  Service: General;  Laterality: N/A;   UMBILICAL HERNIA REPAIR N/A 04/28/2015   Procedure: UMBILICAL HERNIA REPAIR WITH MESH;  Surgeon: Coralie Keens, MD;  Location: [place];  Service: General;  Laterality: N/A;     Left nephrectomy in 1974.  No Known Allergies  Current Outpatient Medications  Medication Sig Dispense Refill   ACCU-CHEK FASTCLIX LANCETS MISC Use lancets to check blood sugar 5 times daily. 500 each 3   amLODipine (NORVASC) 10 MG tablet TAKE 1 TABLET BY MOUTH EVERY DAY 90 tablet 1   amoxicillin (AMOXIL) 500 MG tablet Take 1,000 mg by mouth 2 (two) times daily. Before dental visits     aspirin EC 81 MG tablet Take 81 mg by mouth daily.     B-D UF III MINI PEN NEEDLES 31G X 5 MM MISC USE 4 PER DAY TO INJECT INSULIN. 100 each 5   BD INSULIN SYRINGE U/F 31G X 5/16" 1 ML MISC USE 3 TIMES A DAY WITH HUMULIN R 90 each 2   belatacept (NULOJIX) 250 MG SOLR injection Inject 24.5 mLs into the vein every 28 (twenty-eight) days.      Blood Glucose Monitoring Suppl (ACCU-CHEK GUIDE) w/Device KIT 1 each by Does not apply route daily. Use meter to check blood sugar 5 times daily. 1 kit 0   Blood Glucose Monitoring Suppl (FIFTY50 GLUCOSE METER 2.0) w/Device KIT Use as directed     cefdinir (OMNICEF) 300 MG capsule Take 300 mg by mouth 2 (two) times daily.     DULoxetine (CYMBALTA) 60 MG capsule Take 60 mg by mouth daily.     fluocinonide cream (LIDEX) 4.74 % Apply 1 application topically daily as needed (Skin inflammation (hand, thigh, back)).      furosemide (LASIX) 20 MG tablet Take 20 mg by mouth daily. Takes As Needed     gabapentin (NEURONTIN) 100 MG capsule Take 200-300 mg by mouth See admin instructions. 200 mg in the morning and 300 mg at bedtime     gentamicin cream (GARAMYCIN) 0.1 % Apply 1 application topically 2 (two) times daily. 30 g 1   glucose blood (ACCU-CHEK GUIDE) test strip 1 each by Other route as needed for other. Use as instructed to check blood sugar 5 times daily. 500 each 3   HUMULIN R 500 UNIT/ML injection USE MAX 200 UNITS IN PUMP EVERY THREE DAYS 60 mL 2   Insulin Syringe-Needle U-100 (BD INSULIN SYRINGE ULTRAFINE) 31G X 5/16" 0.3 ML MISC Use to inject insulin 2-3 times per  day 300 each 1   lactulose (CHRONULAC) 10 GM/15ML solution Take 10 g by mouth  daily as needed.      Lancets Misc. (ACCU-CHEK FASTCLIX LANCET) KIT 1 each by Does not apply route daily. 1 kit 0   levothyroxine (SYNTHROID) 100 MCG tablet TAKE 1 TABLET BY MOUTH EVERY DAY 90 tablet 3   lisinopril (ZESTRIL) 20 MG tablet Take 20 mg by mouth 2 (two) times daily.     metoprolol tartrate (LOPRESSOR) 50 MG tablet Take 1 tablet (50 mg total) by mouth 2 (two) times daily. 180 tablet 3   mycophenolate (CELLCEPT) 250 MG capsule Take 1,000 mg by mouth every 12 (twelve) hours.      NEEDLE, DISP, 25 G (B-D DISP NEEDLE 25GX1") 25G X 1" MISC USE TO INJECT TESTOSTERONE (EXCHANGE NEEDLE FROM 22G 3ML SYRINGE) 200 each 2   predniSONE (DELTASONE) 5 MG tablet Take 5 mg by mouth daily with breakfast.     rosuvastatin (CRESTOR) 20 MG tablet Take 20 mg by mouth at bedtime.     senna-docusate (SENOKOT-S) 8.6-50 MG tablet Take 1 tablet by mouth as needed for mild constipation.     Syringe, Disposable, (SYRINGE LUER LOCK) 3 ML MISC Use 1 syringe once weekly to inject Testosterone medication. 100 each 1   tamsulosin (FLOMAX) 0.4 MG CAPS capsule Take 0.8 mg by mouth at bedtime.      testosterone cypionate (DEPOTESTOSTERONE CYPIONATE) 200 MG/ML injection Inject 0.6 mLs (120 mg total) into the muscle every 7 (seven) days. 10 mL 1   traMADol (ULTRAM) 50 MG tablet Take 1 tablet (50 mg total) by mouth every 6 (six) hours as needed. (Patient taking differently: Take 50 mg by mouth every 6 (six) hours as needed. Takes 1 to 2 tabs every 6 hours) 15 tablet 0   traZODone (DESYREL) 50 MG tablet Take 50-100 mg by mouth at bedtime as needed for sleep.      UNABLE TO FIND BiPAP: At bedtime     zolpidem (AMBIEN) 10 MG tablet Take 10-20 mg by mouth at bedtime as needed for sleep.      No current facility-administered medications for this visit.   Social history is notable in that he is married for over 30 years. His 3 children. He has a degree  in chemistry. 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 He does drink occasional alcohol. He has smoked cigars.  ROS General: Negative; No fevers, chills, or night sweats; positive for morbid obesity HEENT: Negative; No changes in vision or hearing, sinus congestion, difficulty swallowing Pulmonary: Recent cough with some purulent sputum, without fever, treated for bilateral pneumonia by Dr. Osborne Casco noted on chest x-ray Cardiovascular: See HPI GI: Negative; No nausea, vomiting, diarrhea, or abdominal pain GU: Negative; No dysuria, hematuria, or difficulty voiding Renal: no longer on dialysis, status post renal transplant Musculoskeletal: Negative; no myalgias, joint pain, or weakness Hematologic/Oncology: Negative; no easy bruising, bleeding Endocrine: Positive for diabetes and hypothyroidism Neuro: Negative; no changes in balance, headaches Skin: Negative; No rashes or skin lesions Psychiatric: Negative; No behavioral problems, depression Sleep: Positive for OSA on BiPAP; no daytime sleepiness, hypersomnolence, bruxism, restless legs, hypnogognic hallucinations, no cataplexy Other comprehensive 14 point system review is negative.  PE BP (!) 163/90   Pulse 69   Ht _0  (1.803 m)   Wt 292 lb (132.5 kg)   SpO2 99%   BMI 40.73 kg/m    Left arm blood pressure 138/78;  he has a right upper arm fistula.  Wt Readings from Last 3 Encounters:  07/11/21 292 lb (132.5 kg)  07/10/21 294 lb  3.2 oz (133.4 kg)  05/16/21 292 lb 12.8 oz (132.8 kg)   General: Alert, oriented, no distress.  Skin: normal turgor, no rashes, warm and dry HEENT: Normocephalic, atraumatic. Pupils equal round and reactive to light; sclera anicteric; extraocular muscles intact;  Nose without nasal septal hypertrophy Mouth/Parynx benign; Mallinpatti scale 4 Neck: Thick neck ; no JVD, no carotid bruits; normal carotid upstroke Lungs: clear to ausculatation and percussion; no wheezing or rales Chest wall: without  tenderness to palpitation Heart: PMI not displaced, RRR, s1 s2 normal, 1/6 systolic murmur, no diastolic murmur, no rubs, gallops, thrills, or heaves Abdomen: soft, nontender; no hepatosplenomehaly, BS+; abdominal aorta nontender and not dilated by palpation. Back: no CVA tenderness Pulses 2+ Musculoskeletal: full range of motion, normal strength, no joint deformities Extremities: no clubbing cyanosis or edema, Homan's sign negative  Neurologic: grossly nonfocal; Cranial nerves grossly wnl Psychologic: Normal mood and affect   ECG (independently read by me):  NSR at 69, LVH with repolarization, no ectopy, normal intervals    May 16, 2021 ECG (independently read by me):  Sinus rhythm at 61 with sinus arrythmia; LVH, T wave abnormality I, aVL, normal intervals  February 2022 ECG (independently read by me): NSR at 70; QS anteroseptally, inferolateral T wave abnormality  September 14, 2020 ECG (independently read by me): Normal sinus rhythm at 73 bpm.  Poor anterior R wave progression V1 through V3.  Mild ST changes laterally.  Normal intervals.  No ectopy.  November 20, 2019 ECG (independently read by me): Normal sinus rhythm at 68 bpm.  Q-wave lead III.  Mild inferior T wave abnormality.  Normal intervals.  QTc interval 440 ms.  No ectopy  May 2019 ECG (independently read by me): NSR at 64; normal intervals.  No ectopy  November 2018 ECG (independently read by me): normal sinus rhythm at 67 bpm.  Normal intervals.  No ectopy.  September 2018 ECG (independently read by me): normal sinus rhythm at 71 bpm.  No ST segment changes.  Normal intervals.  May 2018 ECG (independently read by me): Normal sinus rhythm at 89 bpm.  No STT changes .  PR interval 126 ms.  QTc interval 457 ms. Normal ECG.  October 2017 ECG (independently read by me): Sinus tachycardia 101 bpm.  April 2017 ECG (independently read by me): Sinus tachycardia 100 bpm.  2.  TC interval 45 ms.  November 2014 ECG: Normal sinus  rhythm;  PR interval 138 ms. QT interval 448 ms.  LABS: BMP Latest Ref Rng & Units 07/04/2021 03/23/2021 12/13/2020  Glucose 70 - 99 mg/dL 95 131(H) 240(H)  BUN 6 - 23 mg/dL 31(H) 27(H) 21  Creatinine 0.40 - 1.50 mg/dL 1.93(H) 1.77(H) 1.69(H)  BUN/Creat Ratio 9 - 20 - - -  Sodium 135 - 145 mEq/L 140 138 138  Potassium 3.5 - 5.1 mEq/L 3.7 3.9 4.4  Chloride 96 - 112 mEq/L 106 105 104  CO2 19 - 32 mEq/L _0 Calcium 8.4 - 10.5 mg/dL 9.4 9.9 9.4   Hepatic Function Latest Ref Rng & Units 05/31/2020 03/03/2020 02/12/2020  Total Protein 6.0 - 8.3 g/dL 6.3 5.9(L) 6.5  Albumin 3.5 - 5.2 g/dL 4.1 3.8 4.4  AST 0 - 37 U/L _1 ALT 0 - 53 U/L _2 Alk Phosphatase 39 - 117 U/L 67 73 87  Total Bilirubin 0.2 - 1.2 mg/dL 1.0 0.9 0.8    CBC Latest Ref Rng & Units 12/13/2020 05/31/2020 03/03/2020  WBC  4.0 - 10.5 K/uL 7.6 8.8 9.6  Hemoglobin 13.0 - 17.0 g/dL 14.2 13.8 14.2  Hematocrit 39.0 - 52.0 % 42.7 41.6 44.4  Platelets 150.0 - 400.0 K/uL 232.0 267.0 216    Lab Results  Component Value Date   MCV 83.9 12/13/2020   MCV 85.1 05/31/2020   MCV 86.4 03/03/2020   Lab Results  Component Value Date   TSH 1.15 07/04/2021   Lab Results  Component Value Date   HGBA1C 8.1 (A) 07/10/2021   Lipid Panel     Component Value Date/Time   CHOL 148 07/04/2021 0905   TRIG 54.0 07/04/2021 0905   HDL 69.70 07/04/2021 0905   CHOLHDL 2 07/04/2021 0905   VLDL 10.8 07/04/2021 0905   LDLCALC 67 07/04/2021 0905    RADIOLOGY: No results found.  ECHO: 05/02/2021 IMPRESSIONS   1. Left ventricular ejection fraction, by estimation, is 60 to 65%. The  left ventricle has normal function. The left ventricle has no regional  wall motion abnormalities. There is moderate left ventricular hypertrophy.  Left ventricular diastolic  parameters are consistent with Grade II diastolic dysfunction  (pseudonormalization). Elevated left ventricular end-diastolic pressure.   2. Right ventricular systolic function  is normal. The right ventricular  size is normal. There is moderately elevated pulmonary artery systolic  pressure.   3. Left atrial size was moderately dilated.   4. The mitral valve is abnormal. Mild mitral valve regurgitation.  Moderate to severe mitral annular calcification.   5. The aortic valve is tricuspid. There is moderate calcification of the  aortic valve. Aortic valve regurgitation is trivial. Moderate to severe  aortic valve stenosis. Aortic valve area, by VTI measures 1.09 cm. Aortic  valve mean gradient measures 44.4   mmHg. Aortic valve Vmax measures 3.99 m/s. DI is 0.29 - based on LVOT  diameter of 2.2 cm.   6. Aortic dilatation noted. There is mild dilatation of the ascending  aorta, measuring 42 mm.   7. The inferior vena cava is dilated in size with >50% respiratory  variability, suggesting right atrial pressure of 8 mmHg.   Comparison(s): Changes from prior study are noted. 11/16/2020: LVEF 65-70%,  moderate AS -mean gradient 34.6 mmHg.   IMPRESSION:  1. Aortic valve stenosis, etiology of cardiac valve disease unspecified   2. History of renal transplantation   3. Essential hypertension   4. OSA on BiPAP   5. Stage 3b chronic kidney disease ([place])   6. Hyperlipidemia with target LDL less than 70   7. Type 2 diabetes mellitus with complication, with long-term current use of insulin ([place])   8. Morbid obesity ([place])     ASSESSMENT AND PLAN: Mr. Vancott is a 60 year old Caucasian male who has a history of morbid obesity, hypertension, diabetes mellitus , obstructive sleep apnea on BiPAP, hyperlipidemia, and end-stage kidney disease.  He is status post remote nephrectomy in1974 and on 02/25/2017 after having been on hemodialysis for 3 years he underwent successful renal transplantation from a living donor done at [facility].  He developed a postoperative inguinal hernia, which subsequently was repaired.  There also was a history of possible  diverticular bleeding.  He has a history of SVT, but he has not had any recurrence.  He is followed closely by the transplantation team at [facility].  I reviewed recent [place] records.  On exam he has a murmur of aortic stenosis.  An echo Doppler study in March 2021 showed an EF at 65 to 70%, grade 1  diastolic dysfunction.  He had moderate to severe aortic valve stenosis with a mean gradient of 39 and peak gradient of 58.7.  Aortic valve area is 1.18 cm.  His blood pressure when seen in December 2021 was excellent at he was on amlodipine 10 mg, furosemide as needed for swelling, lisinopril 20 mg twice a day, metoprolol tartrate 50 mg twice a day.  He is on rosuvastatin 20 mg daily for hyperlipidemia.  LDL cholesterol on November 24 was 75.  He is diabetic on insulin.  He continues to be on levothyroxine for hypothyroidism.  He is now on Nulojix in addition to CellCept for his immunosuppressive therapy.  His echo Doppler study from February 2022 was essentially  unchanged.  He had hyperdynamic LV function with EF 65 to 70%, moderate LVH, grade 2 diastolic dysfunction, and on her most recent echo aortic mean gradient was 34.6 with peak gradient of 57 and calculated valve area of 1.05 cm.  Earlier this year he had begun to notice some exertional shortness of breath with activity and also some leg swelling.  His leg swelling typically is worse on the side of his treatment [place] which is right lower extremity.  His most recent echo Doppler study from May 02, 2021 continue to show excellent LV function with grade 2 diastolic dysfunction but his aortic stenosis has progressed and is now in the severe range with a mean gradient of 44 and peak instantaneous gradient of 64 mm.  He had been scheduled to undergo a virtual office visit at [facility] in October.  Since I saw him last, he had developed a productive cough and a chest x-ray suggested possible early bilateral pneumonia for which he was started on antibiotic therapy.  His  most recent laboratory has shown progressive increasing serum creatinine which had risen to 1.93 on September 27 increased from 1.77 on March 23, 2021.  With his increasing creatinine, I have suggested he be reevaluated at the [facility] kidney transplant team in person and not have a virtual visit.  I would defer doing any cardiac catheterization until he is given clearance by his kidney transplant team at [facility].  I have recommended he have a CMet and CBC in 2 weeks presently he has been using support stockings which is helped his previous edema.  He will be following up with Dr. Osborne Casco regarding his pneumonia and hopefully he will be able to be seen at [facility] in later October early November and I will arrange a follow-up appointment to see him subsequent to their evaluation.  Presently he denies any chest pain but does admit to some exertional dyspnea which had increased with his associated pneumonia.  He continues to use BiPAP therapy with 100% compliance and is sleeping well.  His blood pressure was increased on presentation but on repeat by me was 134/84 and he continues to be on amlodipine 10 mg, metoprolol 50 mg twice a day and his lisinopril dose which is prescribed at [facility] which may need to be decreased.  He continues to be on rosuvastatin 20 mg daily for hyperlipidemia with target LDL less than 70.  Most recent LDL cholesterol was 67 on July 04, 2021.  He continues to be on prednisone and CellCept for immunosuppression, levothyroxine for hypothyroidism and insulin therapy for his diabetes mellitus.   Troy Sine, MD, [facility]  07/18/2021 8:14 AM

## 2021-07-11 NOTE — Telephone Encounter (Signed)
Message left to call me to help him make the changes to his pump settings.  Telephone number given

## 2021-07-12 ENCOUNTER — Telehealth: Payer: Self-pay | Admitting: Cardiovascular Disease

## 2021-07-12 NOTE — Telephone Encounter (Signed)
Patient reported that he made the changes to his settings, and did not need any assistance from me.

## 2021-07-12 NOTE — Telephone Encounter (Signed)
Spoke to patient he stated he needs to see Dr.Kelly before November.Appointment scheduled with Dr.Kelly 10/25 at 10:30 am.

## 2021-07-12 NOTE — Telephone Encounter (Signed)
   Pt said, [facility] able to see him next week 10/11 and he said per Dr. Claiborne Billings wants to f/u with him after his [facility] appt. He is asking if he can get sooner appt with Dr. Claiborne Billings after 10/11

## 2021-07-18 ENCOUNTER — Encounter: Payer: Self-pay | Admitting: Cardiovascular Disease

## 2021-07-18 DIAGNOSIS — Z940 Kidney transplant status: Secondary | ICD-10-CM | POA: Diagnosis not present

## 2021-08-01 ENCOUNTER — Ambulatory Visit (INDEPENDENT_AMBULATORY_CARE_PROVIDER_SITE_OTHER): Payer: 59 | Admitting: Cardiovascular Disease

## 2021-08-01 ENCOUNTER — Telehealth: Payer: Self-pay | Admitting: Endocrinology

## 2021-08-01 ENCOUNTER — Encounter: Payer: Self-pay | Admitting: Cardiovascular Disease

## 2021-08-01 ENCOUNTER — Other Ambulatory Visit: Payer: Self-pay

## 2021-08-01 VITALS — BP 130/80 | HR 66 | Ht 71.0 in | Wt 294.2 lb

## 2021-08-01 DIAGNOSIS — I350 Nonrheumatic aortic (valve) stenosis: Secondary | ICD-10-CM | POA: Diagnosis not present

## 2021-08-01 DIAGNOSIS — R82998 Other abnormal findings in urine: Secondary | ICD-10-CM | POA: Diagnosis not present

## 2021-08-01 DIAGNOSIS — Z9989 Dependence on other enabling machines and devices: Secondary | ICD-10-CM

## 2021-08-01 DIAGNOSIS — N1832 Chronic kidney disease, stage 3b: Secondary | ICD-10-CM

## 2021-08-01 DIAGNOSIS — G4733 Obstructive sleep apnea (adult) (pediatric): Secondary | ICD-10-CM | POA: Diagnosis not present

## 2021-08-01 DIAGNOSIS — Z794 Long term (current) use of insulin: Secondary | ICD-10-CM

## 2021-08-01 DIAGNOSIS — E785 Hyperlipidemia, unspecified: Secondary | ICD-10-CM

## 2021-08-01 DIAGNOSIS — E118 Type 2 diabetes mellitus with unspecified complications: Secondary | ICD-10-CM

## 2021-08-01 DIAGNOSIS — E1165 Type 2 diabetes mellitus with hyperglycemia: Secondary | ICD-10-CM

## 2021-08-01 DIAGNOSIS — Z940 Kidney transplant status: Secondary | ICD-10-CM | POA: Diagnosis not present

## 2021-08-01 NOTE — Patient Instructions (Signed)
Medication Instructions:  Your Physician recommend you continue on your current medication as directed.    *If you need a refill on your cardiac medications before your next appointment, please call your pharmacy*   Follow-Up: At [facility], you and your health needs are our priority.  As part of our continuing mission to provide you with exceptional heart care, we have created designated Provider Care Teams.  These Care Teams include your primary Cardiologist (physician) and Advanced Practice Providers (APPs -  Physician Assistants and Nurse Practitioners) who all work together to provide you with the care you need, when you need it.  We recommend signing up for the patient portal called "MyChart".  Sign up information is provided on this After Visit Summary.  MyChart is used to connect with patients for Virtual Visits (Telemedicine).  Patients are able to view lab/test results, encounter notes, upcoming appointments, etc.  Non-urgent messages can be sent to your provider as well.   To learn more about what you can do with MyChart, go to [email or web address].    Your next appointment:   6 week(s)  The format for your next appointment:   In Person  Provider:   Shelva Majestic, MD

## 2021-08-01 NOTE — Telephone Encounter (Signed)
Patient came in to request a new prescription for the Free Style 3 to be sent to the [facility] on [address] in [facility].  Is requesting a 90 day refill if possible  Patient advises that his PCP Dr Osborne Casco office is sending recent labs regarding creatinine lab results.

## 2021-08-01 NOTE — Progress Notes (Signed)
Patient ID: Robert [name], male   DOB: [birth year], 60 y.o.   MRN: [id number]     HPI: Robert [name], is a 60 y.o. male who is a former patient of Dr. Terance Ice. He presents for a 3 week follow-up evaluation.  Robert [name]  has a complex medical history that includes a congenital nonfunctioning left kidney for which he underwent left nephrectomy at age 65. He has a history of obesity, diabetes mellitus, and has been demonstrated to have proteinuria of his remaining single kidney, being status post open renal biopsy in [name] in November 2010. This revealed focal segmental glomerular sclerosis. At that time he was seen by Dr. Venita Sheffield in the nephrology department at [facility]. He sees Dr. Moshe Cipro in [place] for his kidney insufficiency and has been on ARB therapy in the past in an attempt to reduce proteinuria. He sees Dr. Dwyane Dee for his diabetes mellitus. He also has a history of hypothyroidism, hyperlipidemia, anxiety and depression. He has a history of prior supraventricular tachycardia requiring hospitalization in 2007 as was felt to be possibly AV nodal reentrant rhythm.  He has a history of complex sleep apnea initially diagnosed in 2007 with an AHI at that time of 56.7 per hour and during REM sleep this increased to 65.7 per hour. At that time he dropped his oxygen 84% and had loud snoring. He had been on BiPAP therapy. He underwent a 5 year followup split-night study in 2012 which again confirmed severe sleep apnea with an AHI of 60.7 on the baseline portion of the study. He was unable to achieve REM sleep at that time. Oxygen saturation during non-REM sleep at 83%. He was titrated utilizing BiPAP up to a setting of 16/12 cm water pressure. He does admit to continued residual daytime sleepiness despite using his BiPAP therapy.  An echo Doppler study in August 2012 showed mild concentric LVH with normal systolic function and grade 1  diastolic dysfunction. He had trace mitral and tricuspid insufficiency as well as pulmonic insufficiency. A nuclear perfusion study in August 2012 showed normal perfusion.  Robert [name] was on dialysis Monday, [name].  He is on the [facility] transplant list.  In January 2017 he had an stress echo at [facility] as part of his transplant evaluation.  I do not have the results of this evaluation.  He has had some difficulty with a steal syndrome in his right hand since he had AV fistulas inserted in the forearm and most recently now in the upper arm.  He continues to use BiPAP with 100% compliance.  He had a [name].  He obtains his CPAP supplies online because they are cheaper, but had seen [name] patient.  He is using a Art therapist full face mask.  A download in the office from 05/28/2016 through 07/26/2016.  He is meeting compliance standards with usage at 85% usage stays greater than 4 hours at 77%.  He is averaging 6 hours and 43 minutes per night.  He is set at an IPAP pressure of 16 and EPAP pressure of 12.  His AHI was mildly increased at 6.5 with 5.2 per hour of apneic events and 1.3 of hypopnea events.  When I last saw him he had stopped taken Bystolic since his blood pressure had become low at the end of dialysis.  He had been on 5 mg on nondialysis days.  He tells me his aspirin was discontinued because he was  continuously having issues with bleeding following his dialysis when the catheter was removed.   He had an episode of SVT in December 2017, which was successfully aborted with vagal maneuvers.  In the emergency room, he had 2-3 more recurrences of SVT, of which, again, all aborted with vagal maneuvers.  He was started on verapamil and discharge from the hospital.  He has seen Dr. Curt Bears and had an EP study with attempted ablation, but apparently he was not able to be induced and ablation was not performed.   He denies any recurrent episodes of SVT.  Remotely, he  may of had some mild nonexertional musculoskeletal discomfort but has not experienced any of this recently.  Reportedly, his remote stress echo was negative for ischemia, although mild ST changes were noted in recovery.  He continues to be asymptomatic.  He underwent a kidney transplant from a living donor on 02/25/2017 at [facility].  He tolerated surgery well but has developed a postoperative inguinal hernia, which will ultimately need to be surgically repaired.  He received a new BiPAP machine from [facility] 2 weeks ago.  He has a full face mask, but admits to increased leak.    Since I saw him in September 2018, he had 2 hospital stays at [facility].  He developed intestinal bleeding due to diverticular disease and also had some dehydration issues.  He also underwent inguinal hernia surgery.  He denies any recent chest pain or palpitations.  He continues to use BiPAP with 100% compliance.  However, he did not have his machine with him for the days he was in the hospital.  He brought his machine with him today for interrogation.  Over the past month, usage hours and 6 hours and 32 minutes per night with an HI of 0.9.  He used to machine 27 of 30 nights nights that were not used was because he was in the hospital.  Usage greater than 4 hours was 26 of 30.  He is on a BiPAP pressure of 18/13 0.9.  Recently he has noticed a mild leak.  AHI was 5.6 over the past 7 days.  He has a ResMed fullface mask, Mirage Quatro mask.    When I saw him in November 2018, he was feeling well. He is seeing Dr. Azzie Glatter at [facility] from a nephrology standpoint.  He denies any episodes of chest pain or awareness of recurrent arrhythmia.  He admits to 100% use with CPAP.  [name] is his DME company.  He has a ResMed air 10 V auto unit with an EPAP pressure 14.  An IPAP pressure set at 18.  A download from 01/14/2017 through 02/12/2018 shows 100% compliance.  He is averaging 8 hours and 7 minutes of sleep.  AHI is excellent at  2.6.    I saw him in May 2019 and since that time, he has continued to follow at [facility]. He was last evaluated by me in a telemedicine visit in April 2020.  At that time he had noticed that over the past year  his blood pressure had increased and his lisinopril dose was increased to 20 mg and amlodipine reduced to 5 mg.  His weight had fluctuated and has increased from 293 to 303.  He has continued to use BiPAP with 100% compliance.  I was able to obtain a download today from March 23 through January 27, 2019.  He is 100% compliant with average BiPAP use of 7 hours and 10 minutes.  At  his 18/14 pressure, AHI remains excellent at 2.6/h.  Over the past 6 months he has changed jobs twice.  He is been staying home particularly with his significant comorbidities which can be affected by the coronavirus including hypertension, diabetes mellitus, obesity, renal disease, and immunosuppression.  He walks approximately 2 times a day for short duration.  He receives immunosuppressant infusion with Nul[employer] and is scheduled for his next infusion: On Feb 12, 2019.  His last lipid panel was in October 2019 with total cholesterol 137 and LDL cholesterol 73.  He is scheduled to see his nephrologist in August.    I saw him in the office in February 2021 and since his  telemedicine evaluation in April 2020, he continued to be followed closely at [facility].  Creatinine had increased to 1.74.  He has noticed blood pressure elevation.  He has not been successful with weight loss.  He has been without anginal symptoms or awareness of recurrent SVT.  He has continued to use BiPAP therapy.  A download from 10/22/2019 through 11/20/1999 shows 93% of usage days.  Average use is 8 hours and 10 minutes.  At his pressure of 18/14, AHI is excellent at 1.5.  There is no mask leak.  During his February 2021 evaluation he had a 2/6 murmur consistent with aortic stenosis and I recommended follow-up echo Doppler evaluation.   He underwent an echo Doppler  study on December 08, 2019 which showed an EF of 65 to 70%, moderate left ventricular hypertrophy and grade 1 diastolic dysfunction.  There was mild mitral valve regurgitation.  There was moderate to severe aortic stenosis with a mean gradient of 39 peak gradient of 58.   He was evaluated in a telemedicine visit in June 2021.  At that time he denied any chest pain, presyncope or syncope.  He continues to go to [facility] for nephrology follow-up.  He states his blood pressure is stable every morning upon awakening but as a day progresses it may go up slightly.  He denies any exertional shortness of breath.  At times he has noticed some mild lower extremity edema.  He was evaluated at [facility] in July 2021 and was hospitalized overnight with right lower quadrant pain around his renal allograft.  A CT scan demonstrated normal-appearing transplant kidney without any acute abdominal/pelvic abnormalities however, significant stool burden was noted on CT suggesting moderate to severe constipation.  I last saw him in December 2021 at which time he denied any chest pain but had experienced some mild shortness of breath particularly with walking up steps.Marland Kitchen He is now on insulin pump.  He has noticed some lower extremity edema.  He continued to use BiPAP.  A download was obtained in the office today from November 7 through September 12, 2020 which shows excellent compliance.  There were only 2 days of nonuse when he never went to bed and it stayed in a recliner.  He is on BiPAP with a pressure of 18/14.  He is averaging 8 hours and 54 minutes of BiPAP use per night.  AHI is excellent at 2.0.  He does have mask leak and has been noticing a leak in the region of his chin.    I recommended he have a follow-up echo Doppler study which was done on November 16, 2020.  This showed continued hyperdynamic LV function with EF at 65 to 70% and moderate LVH with grade 2 diastolic dysfunction.  RV size was normal.  Moderately elevated pulmonary  artery systolic  pressure at 45 mm.  There was mild mitral stenosis with a mean valve gradient of 3.2 mm with moderately severe mitral annular calcification.  There was moderate calcification of his trileaflet aortic valve.  He was felt to have moderate aortic stenosis with a mean gradient of 34.6 and peak gradient of 57 with calculated valve area 1.05 cm.  I saw him on December 01, 2020 at which time he continued to feel well but was experiencing some some leg edema and  shortness of breath with activity.  He is followed closely at [facility] for his renal transplant.  Most recent creatinine on October 27, 2020 was further increased at 1.89.  Hemoglobin A1c was 6.9. He denies palpitations.  He continues to use BiPAP.  I obtained a new download in the office today from January 25 through November 30, 2020.  Compliance is excellent with average use at 10 hours and 16 minutes per night.  At his BiPAP pressure of 18/14, AHI is 2.3.  There are nights with significant mask leak.  During that evaluation, I recommended that he undergo a follow-up echo Doppler study for reassessment of his aortic valve which may be progressing and it stenosis.  When I saw him on August 9,2022 he had recently noticed some slight increase shortness of breath with exertion.  He denied any presyncope or syncope.  However at times when his blood pressure gets too low he does note transient dizziness.  He admitted to swelling in his lower extremities right leg greater than left and has taken as needed furosemide but has not taken this recently.  He wass scheduled to undergo a renal transplant reevaluation in October at [facility].  He denied any chest pain.  He denies any palpitations.  He underwent an echo Doppler study on May 02, 2021.  EF remained stable at 60 to 65%.  There was moderate left ventricular hypertrophy and there was grade 2 diastolic dysfunction.  There was moderate left atrial dilatation.  There was moderate calcification of a  trileaflet aortic valve with moderately severe aortic stenosis.  The mean gradient had now increased to 44.4 and the peak instantaneous gradient to 64 mmHg.  The aortic valve area (VTI) was 0.81 cm.  There was mild dilation of the ascending aorta at 42 mm.  He continues to use BiPAP with 100% compliance.  A new download was obtained from July 10 through May 15, 2021 and average use is 8 hours and 15 minutes per night.  His BiPAP settings are 18/14 and AHI is excellent at 1.6/h.  During that evaluation, I discussed with him that with his progressive aortic valve stenosis he may ultimately require definitive cardiac catheterization and will need extensive CT imaging for potential candidacy for TAVR.  At that time I discussed with him the importance of being seen by his [facility] transplant team.  I also suggested he wear support stockings at least 20 to 30 mg and as needed to take furosemide 20 mg possibly daily or every other 2 to 3 days but depending upon his leg swelling.  I last saw him on July 11, 2021 and since his prior evaluation he had contacted [facility] verbally since I saw him, he had apparently contacted [facility] verbally and was told that if catheterization was necessary he would need to undergo go adequate hydration.  Apparently, he subsequently developed a cough on September 24 through 25 that was productive with some purulent sputum.  He was seen by Dr. Osborne Casco.  A chest  x-ray reportedly suggested bilateral pneumonia.  He tested negative for COVID and flu.  He was empirically started on cefdinir yesterday for a 10-day course of antibiotics.  He underwent follow-up laboratory on July 04, 2021.  His creatinine had risen to 1.93 on July 04, 2021 which had increased from 1.77 on March 23, 2021 and from 1.69 on December 13, 2020.  During that evaluation, I stressed to him that I wanted to be seen in person at [facility] rather than a virtual visit.  He underwent follow-up laboratory at [facility] on July 18, 2021  and his creatinine now had increased to 2.2 with estimated GFR of 33 consistent with stage IIIb CKD, gradually progressing towards stage IV.  He was evaluated at the time by Veronda Prude, PA and his October 11 creatinine was not yet available  and it was felt that his creatinine was stable at 1.7.   Presently, Robert [name] denies any recent chest tightness or pressure.  He does have fatigability and low stamina.  He is to undergo a monthly antirejection infusion with Nulojix at the [facility] infusion center and for that he will have a follow-up bmet.  I have also asked that he personally contact his [facility] physician following this regarding his progressive increase in creatinine and potential risk associated with future catheterization if necessary.  He continues to use BiPAP and a download was obtained in the office today from September 25 through July 31, 2021.  Compliance is excellent with average use at 8 hours and 38 minutes.  At his 18/14 centimeters water pressure AHI is excellent at 1.8.   Past Medical History:  Diagnosis Date   Anemia    low hgb at present   Arthritis    FEET   Blood transfusion without reported diagnosis 1974   kidney removed - L   Cancer (HCC)    skin ca right shoulder, plastic dsyplasia, pre-Ca polpys removed on Colonoscopy- 07/2014   Colon polyps 07/23/2014   Tubular adenomas x 5   Constipation    Diabetes mellitus without complication (HCC)    Type 2   Difficult intubation    unsure of actual problem but it was during the January 28, 2015 procedure.   Dysrhythmia    Eczema    ESRD (end stage renal disease) (HCC)    hEMO mwf   Headache(784.0)    slight dull h/a due kidney failure   Hyperlipidemia    Hypertension    SVT h/o , followed by Dr. Tresa Endo   Hypothyroidism    Macular degeneration    Neuropathy    right hand and both feet   PAT (paroxysmal atrial tachycardia) (HCC)    PSVT (paroxysmal supraventricular tachycardia) (HCC)    SBO (small  bowel obstruction) (HCC)    Shortness of breath    with exertion   Sleep apnea 06/18/11   split-night sleep study- [facility]., BiPAP- 13-16   Thyroid disease    Umbilical hernia    will repair with thi surgery    Past Surgical History:  Procedure Laterality Date   AV FISTULA PLACEMENT Right 02/25/2015   Procedure: RIGHT ARTERIOVENOUS (AV) FISTULA CREATION;  Surgeon: Nada Libman, MD;  Location: [facility] OR;  Service: Vascular;  Laterality: Right;   AV FISTULA PLACEMENT Right 09/15/2015   Procedure: ARTERIOVENOUS (AV) FISTULA CREATION- RIGHT ARM;  Surgeon: Nada Libman, MD;  Location: [facility] OR;  Service: Vascular;  Laterality: Right;   CAPD INSERTION N/A 05/18/2014  Procedure: LAPAROSCOPIC INSERTION CONTINUOUS AMBULATORY PERITONEAL DIALYSIS  (CAPD) CATHETER;  Surgeon: Ralene Ok, MD;  Location: [place];  Service: General;  Laterality: N/A;   COLONOSCOPY     COLONOSCOPY WITH PROPOFOL N/A 07/12/2016   Procedure: COLONOSCOPY WITH PROPOFOL;  Surgeon: [name], MD;  Location: [facility];  Service: Endoscopy;  Laterality: N/A;   declotting of fistula  08/2015   ESOPHAGOGASTRODUODENOSCOPY (EGD) WITH PROPOFOL N/A 07/12/2016   Procedure: ESOPHAGOGASTRODUODENOSCOPY (EGD) WITH PROPOFOL;  Surgeon: [name], MD;  Location: [facility];  Service: Endoscopy;  Laterality: N/A;   FISTULA SUPERFICIALIZATION Right 02/07/2016   Procedure: RIIGHT UPPER ARM FISTULA SUPERFICIALIZATION;  Surgeon: Elam Dutch, MD;  Location: [place];  Service: Vascular;  Laterality: Right;   IJ catheter insertion     INSERTION OF DIALYSIS CATHETER Right 02/25/2015   Procedure: INSERTION OF RIGHT INTERNAL JUGULAR DIALYSIS CATHETER;  Surgeon: Serafina Mitchell, MD;  Location: [facility] OR;  Service: Vascular;  Laterality: Right;   KIDNEY TRANSPLANT     LAPAROSCOPIC REPOSITIONING CAPD CATHETER N/A 06/16/2014   Procedure: LAPAROSCOPIC REPOSITIONING CAPD CATHETER;  Surgeon: Ralene Ok, MD;  Location:  [place];  Service: General;  Laterality: N/A;   LAPAROSCOPY N/A 05/04/2015   Procedure: LAPAROSCOPY DIAGNOSTIC LYSIS OF ADHESIONS;  Surgeon: Coralie Keens, MD;  Location: [place];  Service: General;  Laterality: N/A;   MINOR REMOVAL OF PERITONEAL DIALYSIS CATHETER N/A 04/28/2015   Procedure:  REMOVAL OF PERITONEAL DIALYSIS CATHETER;  Surgeon: Coralie Keens, MD;  Location: [place];  Service: General;  Laterality: N/A;   NEPHRECTOMY Left 1974   RENAL BIOPSY Right 2012   REVISON OF ARTERIOVENOUS FISTULA Right 05/26/2015   Procedure: SUPERFICIALIZATION OF ARTERIOVENOUS FISTULA WITH SIDE BRANCH LIGATIONS;  Surgeon: Serafina Mitchell, MD;  Location: [place];  Service: Vascular;  Laterality: Right;   SKIN CANCER EXCISION     right shoulder   SVT ABLATION N/A 11/23/2016   Procedure: SVT Ablation;  Surgeon: Will Meredith Leeds, MD;  Location: [place] [facility];  Service: Cardiovascular;  Laterality: N/A;   UMBILICAL HERNIA REPAIR N/A 05/18/2014   Procedure: HERNIA REPAIR UMBILICAL ADULT;  Surgeon: Ralene Ok, MD;  Location: [place];  Service: General;  Laterality: N/A;   UMBILICAL HERNIA REPAIR N/A 04/28/2015   Procedure: UMBILICAL HERNIA REPAIR WITH MESH;  Surgeon: Coralie Keens, MD;  Location: [place];  Service: General;  Laterality: N/A;    Left nephrectomy in 1974.  No Known Allergies  Current Outpatient Medications  Medication Sig Dispense Refill   ACCU-CHEK FASTCLIX LANCETS MISC Use lancets to check blood sugar 5 times daily. 500 each 3   amLODipine (NORVASC) 10 MG tablet TAKE 1 TABLET BY MOUTH EVERY DAY 90 tablet 1   amoxicillin (AMOXIL) 500 MG tablet Take 1,000 mg by mouth 2 (two) times daily. Before dental visits     aspirin EC 81 MG tablet Take 81 mg by mouth daily.     B-D UF III MINI PEN NEEDLES 31G X 5 MM MISC USE 4 PER DAY TO INJECT INSULIN. 100 each 5   BD INSULIN SYRINGE U/F 31G X 5/16" 1 ML MISC USE 3 TIMES A DAY WITH HUMULIN R 90 each 2   belatacept (NULOJIX) 250 MG SOLR injection  Inject 24.5 mLs into the vein every 28 (twenty-eight) days.      Blood Glucose Monitoring Suppl (ACCU-CHEK GUIDE) w/Device KIT 1 each by Does not apply route daily. Use meter to check blood sugar 5 times daily. 1 kit  0   Blood Glucose Monitoring Suppl (FIFTY50 GLUCOSE METER 2.0) w/Device KIT Use as directed     DULoxetine (CYMBALTA) 60 MG capsule Take 60 mg by mouth daily.     fluocinonide cream (LIDEX) 2.13 % Apply 1 application topically daily as needed (Skin inflammation (hand, thigh, back)).      furosemide (LASIX) 20 MG tablet Take 20 mg by mouth daily. Takes As Needed     gabapentin (NEURONTIN) 100 MG capsule Take 200-300 mg by mouth See admin instructions. 200 mg in the morning and 300 mg at bedtime     gentamicin cream (GARAMYCIN) 0.1 % Apply 1 application topically 2 (two) times daily. 30 g 1   glucose blood (ACCU-CHEK GUIDE) test strip 1 each by Other route as needed for other. Use as instructed to check blood sugar 5 times daily. 500 each 3   HUMULIN R 500 UNIT/ML injection USE MAX 200 UNITS IN PUMP EVERY THREE DAYS 60 mL 2   insulin aspart (NOVOLOG FLEXPEN) 100 UNIT/ML FlexPen      insulin degludec (TRESIBA) 100 UNIT/ML FlexTouch Pen Inject into the skin.     Insulin Syringe-Needle U-100 (BD INSULIN SYRINGE ULTRAFINE) 31G X 5/16" 0.3 ML MISC Use to inject insulin 2-3 times per day 300 each 1   lactulose (CHRONULAC) 10 GM/15ML solution Take 10 g by mouth daily as needed.      Lancets Misc. (ACCU-CHEK FASTCLIX LANCET) KIT 1 each by Does not apply route daily. 1 kit 0   levothyroxine (SYNTHROID) 100 MCG tablet TAKE 1 TABLET BY MOUTH EVERY DAY 90 tablet 3   lisinopril (ZESTRIL) 20 MG tablet Take 20 mg by mouth 2 (two) times daily.     metoprolol tartrate (LOPRESSOR) 50 MG tablet Take 1 tablet (50 mg total) by mouth 2 (two) times daily. 180 tablet 3   mycophenolate (CELLCEPT) 250 MG capsule Take 1,000 mg by mouth every 12 (twelve) hours.      NEEDLE, DISP, 25 G (B-D DISP NEEDLE 25GX1") 25G  X 1" MISC USE TO INJECT TESTOSTERONE (EXCHANGE NEEDLE FROM 22G 3ML SYRINGE) 200 each 2   predniSONE (DELTASONE) 5 MG tablet Take 5 mg by mouth daily with breakfast.     rosuvastatin (CRESTOR) 20 MG tablet Take 20 mg by mouth at bedtime.     senna-docusate (SENOKOT-S) 8.6-50 MG tablet Take 1 tablet by mouth as needed for mild constipation.     Syringe, Disposable, (SYRINGE LUER LOCK) 3 ML MISC Use 1 syringe once weekly to inject Testosterone medication. 100 each 1   tamsulosin (FLOMAX) 0.4 MG CAPS capsule Take 0.8 mg by mouth at bedtime.      testosterone cypionate (DEPOTESTOSTERONE CYPIONATE) 200 MG/ML injection Inject 0.6 mLs (120 mg total) into the muscle every 7 (seven) days. 10 mL 1   traMADol (ULTRAM) 50 MG tablet Take 1 tablet (50 mg total) by mouth every 6 (six) hours as needed. (Patient taking differently: Take 50 mg by mouth every 6 (six) hours as needed. Takes 1 to 2 tabs every 6 hours) 15 tablet 0   traZODone (DESYREL) 50 MG tablet Take 50-100 mg by mouth at bedtime as needed for sleep.      UNABLE TO FIND BiPAP: At bedtime     zolpidem (AMBIEN) 10 MG tablet Take 10-20 mg by mouth at bedtime as needed for sleep.      cefdinir (OMNICEF) 300 MG capsule Take 300 mg by mouth 2 (two) times daily. (Patient not taking: Reported on 08/01/2021)  No current facility-administered medications for this visit.   Social history is notable in that he is married for over 30 years. His 3 children. He has a degree in chemistry. 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 He does drink occasional alcohol. He has smoked cigars.  ROS General: Negative; No fevers, chills, or night sweats; positive for morbid obesity HEENT: Negative; No changes in vision or hearing, sinus congestion, difficulty swallowing Pulmonary: Recent cough with some purulent sputum, without fever, treated for bilateral pneumonia by Dr. Osborne Casco noted on chest x-ray Cardiovascular: See HPI GI: Negative; No nausea, vomiting, diarrhea,  or abdominal pain GU: Negative; No dysuria, hematuria, or difficulty voiding Renal: no longer on dialysis, status post renal transplant Musculoskeletal: Negative; no myalgias, joint pain, or weakness Hematologic/Oncology: Negative; no easy bruising, bleeding Endocrine: Positive for diabetes and hypothyroidism Neuro: Negative; no changes in balance, headaches Skin: Negative; No rashes or skin lesions Psychiatric: Negative; No behavioral problems, depression Sleep: Positive for OSA on BiPAP; no daytime sleepiness, hypersomnolence, bruxism, restless legs, hypnogognic hallucinations, no cataplexy Other comprehensive 14 point system review is negative.  PE BP 130/80 (BP Location: Left Arm)   Pulse 66   Ht $R'5\' 11"'CZ$  (1.803 m)   Wt 294 lb 3.2 oz (133.4 kg)   SpO2 97%   BMI 41.03 kg/m    Left arm blood pressure 130/78;  he has a right upper arm fistula.  Wt Readings from Last 3 Encounters:  08/01/21 294 lb 3.2 oz (133.4 kg)  07/11/21 292 lb (132.5 kg)  07/10/21 294 lb 3.2 oz (133.4 kg)   General: Alert, oriented, no distress.  Skin: normal turgor, no rashes, warm and dry HEENT: Normocephalic, atraumatic. Pupils equal round and reactive to light; sclera anicteric; extraocular muscles intact; Nose without nasal septal hypertrophy Mouth/Parynx benign; Mallinpatti scale 4 Neck: Thick neck; no JVD, no carotid bruits; normal carotid upstroke Lungs: clear to ausculatation and percussion; no wheezing or rales Chest wall: without tenderness to palpitation Heart: PMI not displaced, RRR, s1 s2 normal, 1/6 systolic murmur, no diastolic murmur, no rubs, gallops, thrills, or heaves Abdomen: soft, nontender; no hepatosplenomehaly, BS+; abdominal aorta nontender and not dilated by palpation. Back: no CVA tenderness Pulses 2+ Musculoskeletal: full range of motion, normal strength, no joint deformities Extremities: no clubbing cyanosis or edema, Homan's sign negative  Neurologic: grossly nonfocal;  Cranial nerves grossly wnl Psychologic: Normal mood and affect   August 01, 2021 ECG (independently read by me):  NSR at 66, STT changes inferolaterally  July 11, 2021 ECG (independently read by me):  NSR at 69, LVH with repolarization, no ectopy, normal intervals    May 16, 2021 ECG (independently read by me):  Sinus rhythm at 61 with sinus arrythmia; LVH, T wave abnormality I, aVL, normal intervals  February 2022 ECG (independently read by me): NSR at 70; QS anteroseptally, inferolateral T wave abnormality  September 14, 2020 ECG (independently read by me): Normal sinus rhythm at 73 bpm.  Poor anterior R wave progression V1 through V3.  Mild ST changes laterally.  Normal intervals.  No ectopy.  November 20, 2019 ECG (independently read by me): Normal sinus rhythm at 68 bpm.  Q-wave lead III.  Mild inferior T wave abnormality.  Normal intervals.  QTc interval 440 ms.  No ectopy  May 2019 ECG (independently read by me): NSR at 64; normal intervals.  No ectopy  November 2018 ECG (independently read by me): normal sinus rhythm at 67 bpm.  Normal intervals.  No ectopy.  September 2018 ECG (independently read by me): normal sinus rhythm at 71 bpm.  No ST segment changes.  Normal intervals.  May 2018 ECG (independently read by me): Normal sinus rhythm at 89 bpm.  No STT changes .  PR interval 126 ms.  QTc interval 457 ms. Normal ECG.  October 2017 ECG (independently read by me): Sinus tachycardia 101 bpm.  April 2017 ECG (independently read by me): Sinus tachycardia 100 bpm.  2.  TC interval 45 ms.  November 2014 ECG: Normal sinus rhythm;  PR interval 138 ms. QT interval 448 ms.  LABS: BMP Latest Ref Rng & Units 07/04/2021 03/23/2021 12/13/2020  Glucose 70 - 99 mg/dL 95 131(H) 240(H)  BUN 6 - 23 mg/dL 31(H) 27(H) 21  Creatinine 0.40 - 1.50 mg/dL 1.93(H) 1.77(H) 1.69(H)  BUN/Creat Ratio 9 - 20 - - -  Sodium 135 - 145 mEq/L 140 138 138  Potassium 3.5 - 5.1 mEq/L 3.7 3.9 4.4  Chloride 96  - 112 mEq/L 106 105 104  CO2 19 - 32 mEq/L $Remove'23 25 26  'LhZpRjj$ Calcium 8.4 - 10.5 mg/dL 9.4 9.9 9.4   Hepatic Function Latest Ref Rng & Units 05/31/2020 03/03/2020 02/12/2020  Total Protein 6.0 - 8.3 g/dL 6.3 5.9(L) 6.5  Albumin 3.5 - 5.2 g/dL 4.1 3.8 4.4  AST 0 - 37 U/L $Remo'14 21 15  'iHgKh$ ALT 0 - 53 U/L $Remo'22 23 20  'msvPv$ Alk Phosphatase 39 - 117 U/L 67 73 87  Total Bilirubin 0.2 - 1.2 mg/dL 1.0 0.9 0.8    CBC Latest Ref Rng & Units 12/13/2020 05/31/2020 03/03/2020  WBC 4.0 - 10.5 K/uL 7.6 8.8 9.6  Hemoglobin 13.0 - 17.0 g/dL 14.2 13.8 14.2  Hematocrit 39.0 - 52.0 % 42.7 41.6 44.4  Platelets 150.0 - 400.0 K/uL 232.0 267.0 216    Lab Results  Component Value Date   MCV 83.9 12/13/2020   MCV 85.1 05/31/2020   MCV 86.4 03/03/2020   Lab Results  Component Value Date   TSH 1.15 07/04/2021   Lab Results  Component Value Date   HGBA1C 8.1 (A) 07/10/2021   Lipid Panel     Component Value Date/Time   CHOL 148 07/04/2021 0905   TRIG 54.0 07/04/2021 0905   HDL 69.70 07/04/2021 0905   CHOLHDL 2 07/04/2021 0905   VLDL 10.8 07/04/2021 0905   LDLCALC 67 07/04/2021 0905    RADIOLOGY: No results found.  ECHO: 05/02/2021 IMPRESSIONS   1. Left ventricular ejection fraction, by estimation, is 60 to 65%. The  left ventricle has normal function. The left ventricle has no regional  wall motion abnormalities. There is moderate left ventricular hypertrophy.  Left ventricular diastolic  parameters are consistent with Grade II diastolic dysfunction  (pseudonormalization). Elevated left ventricular end-diastolic pressure.   2. Right ventricular systolic function is normal. The right ventricular  size is normal. There is moderately elevated pulmonary artery systolic  pressure.   3. Left atrial size was moderately dilated.   4. The mitral valve is abnormal. Mild mitral valve regurgitation.  Moderate to severe mitral annular calcification.   5. The aortic valve is tricuspid. There is moderate calcification of the   aortic valve. Aortic valve regurgitation is trivial. Moderate to severe  aortic valve stenosis. Aortic valve area, by VTI measures 1.09 cm. Aortic  valve mean gradient measures 44.4   mmHg. Aortic valve Vmax measures 3.99 m/s. DI is 0.29 - based on LVOT  diameter of 2.2 cm.   6. Aortic dilatation noted. There  is mild dilatation of the ascending  aorta, measuring 42 mm.   7. The inferior vena cava is dilated in size with >50% respiratory  variability, suggesting right atrial pressure of 8 mmHg.   Comparison(s): Changes from prior study are noted. 11/16/2020: LVEF 65-70%,  moderate AS -mean gradient 34.6 mmHg.   IMPRESSION:  1. Aortic valve stenosis, etiology of cardiac valve disease unspecified   2. History of renal transplantation   3. OSA on BiPAP   4. Stage 3b chronic kidney disease ([place])   5. Type 2 diabetes mellitus with complication, with long-term current use of insulin ([place])   6. Hyperlipidemia with target LDL less than 70   7. Morbid obesity ([place])     ASSESSMENT AND PLAN: Robert [name] is a 60 year old Caucasian male who has a history of morbid obesity, hypertension, diabetes mellitus , obstructive sleep apnea on BiPAP, hyperlipidemia, and end-stage kidney disease.  He is status post remote nephrectomy in1974 and on 02/25/2017 after having been on hemodialysis for 3 years he underwent successful renal transplantation from a living donor done at [facility].  He developed a postoperative inguinal hernia, which subsequently was repaired.  There also was a history of possible diverticular bleeding.  He has a history of SVT, but he has not had any recurrence.  He is followed closely by the transplantation team at [facility].  I reviewed recent [place] records.  On exam he has a murmur of aortic stenosis.  An echo Doppler study in March 2021 showed an EF at 65 to 50%, grade 1 diastolic dysfunction.  He had moderate to severe aortic valve stenosis with a mean gradient of 39 and  peak gradient of 58.7.  Aortic valve area is 1.18 cm.  His blood pressure when seen in December 2021 was excellent at he was on amlodipine 10 mg, furosemide as needed for swelling, lisinopril 20 mg twice a day, metoprolol tartrate 50 mg twice a day.  He is on rosuvastatin 20 mg daily for hyperlipidemia.  LDL cholesterol on November 24 was 75.  He is diabetic on insulin.  He continues to be on levothyroxine for hypothyroidism.  He is now on Nulojix in addition to CellCept for his immunosuppressive therapy.  He has monthly infusions with Nulojix and is scheduled to undergo his next infusion in several weeks.  His echo Doppler study from February 2022 was essentially  unchanged.  He had hyperdynamic LV function with EF 65 to 70%, moderate LVH, grade 2 diastolic dysfunction, and on her most recent echo aortic mean gradient was 34.6 with peak gradient of 57 and calculated valve area of 1.05 cm.  Earlier this year he had begun to notice some exertional shortness of breath with activity and also some leg swelling.  His leg swelling typically is worse on the side of his treatment [place] which is right lower extremity.  His most recent echo Doppler study from May 02, 2021 continue to show excellent LV function with grade 2 diastolic dysfunction but his aortic stenosis has progressed and is now in the severe range with a mean gradient of 44 and peak instantaneous gradient of 64 mm.  He had been scheduled to undergo a virtual office visit at [facility] in October. He had developed a productive cough and a chest x-ray suggested possible early bilateral pneumonia for which he was started on antibiotic therapy.  His most recent laboratory has shown progressive increasing serum creatinine which had risen to 1.93 on September 27 increased from 1.77  on March 23, 2021.  I had recommended a [facility] in person evaluation and apparently when seen his most recent laboratory was not yet available.  On that lab work from July 18, 2021,  creatinine had further increased to 2.2 GFR 33.  Will be undergoing an infusion over the next several weeks and repeat laboratory will be obtained.  I have recommended he contact his [place] particularly if function continues to worsen.  Presently, I will defer doing his heart catheterization pending improved renal stability so as not to potentially further exacerbate his rising creatinine.  Presently he is without anginal symptoms but does admit to fatigability and exertional dyspnea.  He denies any presyncope or syncope.  He continues to use BiPAP with excellent tolerance a download obtained in the office today at 18/14 pressure revealed an AHI of 1.8.  I will see him in 6 to 8 weeks for reevaluation or sooner as needed.   Troy Sine, MD, [facility]  08/07/2021 6:37 PM

## 2021-08-07 ENCOUNTER — Encounter: Payer: Self-pay | Admitting: Cardiovascular Disease

## 2021-08-08 ENCOUNTER — Encounter: Payer: Self-pay | Admitting: Gastroenterology

## 2021-08-08 MED ORDER — FREESTYLE LIBRE 3 SENSOR MISC: 1.0000 | 3 refills | Status: DC

## 2021-08-08 NOTE — Telephone Encounter (Signed)
Rx sent to preferred pharmacy. We have not received labs

## 2021-08-08 NOTE — Addendum Note (Signed)
Addended by: Cinda Quest on: 08/08/2021 11:13 AM   Modules accepted: Orders

## 2021-08-09 ENCOUNTER — Telehealth: Payer: Self-pay | Admitting: Cardiovascular Disease

## 2021-08-09 NOTE — Telephone Encounter (Signed)
Called pt he states that that he has severe Aortic stenosis and it "has progressed to severe" he states that he is having intermittent chest pain and heaviness that radiates from mid-chest up to the clavicle on the left side also perfuse sweating and nausea upon exertion ("worsens at about 30 feet and cannot make it to 60 feet without having to sit down) and heaviness both when inactive and when inactive. He has taken all of his medications today. He cannot take his BP at this time. He does not have nitro on his medication list. Informed pt that he needs to go directly to the ER! He states that he cannot go to the ER right now he is in the [place] at an appointment with his daughter. He did not say if he would go after. He says that his "most important thing" is to make sure Dr Claiborne Billings gets this message. I informed pt the need to go directly to the ER. And he re-iterated to send message to Dr Claiborne Billings. Informed pt that Dr Claiborne Billings is seeing pt's currently but I will send message to him.

## 2021-08-09 NOTE — Telephone Encounter (Signed)
Patient called and said that he wanted to talk with Dr. Claiborne Billings or nurse. Please call back.

## 2021-08-11 ENCOUNTER — Other Ambulatory Visit: Payer: Self-pay | Admitting: Endocrinology

## 2021-08-16 ENCOUNTER — Ambulatory Visit: Payer: 59 | Admitting: Cardiovascular Disease

## 2021-08-17 ENCOUNTER — Telehealth: Payer: Self-pay | Admitting: Medical

## 2021-08-17 NOTE — Telephone Encounter (Signed)
New Message:       Patient says he have an appointment with Robert [name] tomorrow(08-18-21). He wants her to be aware that his Aortic Stenosis is getting worse. Also his last Creatine on 08-13-21 was 1-81. He also wants her to consider having a Cath before the end of the year,.

## 2021-08-17 NOTE — Telephone Encounter (Signed)
Routed to provider to make aware to discuss at appt tomorrow

## 2021-08-18 ENCOUNTER — Ambulatory Visit (INDEPENDENT_AMBULATORY_CARE_PROVIDER_SITE_OTHER): Payer: 59 | Admitting: Medical

## 2021-08-18 ENCOUNTER — Other Ambulatory Visit: Payer: Self-pay

## 2021-08-18 ENCOUNTER — Encounter: Payer: Self-pay | Admitting: Medical

## 2021-08-18 VITALS — BP 116/70 | HR 79 | Ht 71.0 in | Wt 289.2 lb

## 2021-08-18 DIAGNOSIS — E118 Type 2 diabetes mellitus with unspecified complications: Secondary | ICD-10-CM

## 2021-08-18 DIAGNOSIS — Z9989 Dependence on other enabling machines and devices: Secondary | ICD-10-CM

## 2021-08-18 DIAGNOSIS — I10 Essential (primary) hypertension: Secondary | ICD-10-CM

## 2021-08-18 DIAGNOSIS — I350 Nonrheumatic aortic (valve) stenosis: Secondary | ICD-10-CM

## 2021-08-18 DIAGNOSIS — Z01818 Encounter for other preprocedural examination: Secondary | ICD-10-CM | POA: Diagnosis not present

## 2021-08-18 DIAGNOSIS — G4733 Obstructive sleep apnea (adult) (pediatric): Secondary | ICD-10-CM

## 2021-08-18 DIAGNOSIS — E785 Hyperlipidemia, unspecified: Secondary | ICD-10-CM

## 2021-08-18 DIAGNOSIS — Z01810 Encounter for preprocedural cardiovascular examination: Secondary | ICD-10-CM

## 2021-08-18 DIAGNOSIS — Z794 Long term (current) use of insulin: Secondary | ICD-10-CM

## 2021-08-18 DIAGNOSIS — Z940 Kidney transplant status: Secondary | ICD-10-CM

## 2021-08-18 DIAGNOSIS — N1832 Chronic kidney disease, stage 3b: Secondary | ICD-10-CM

## 2021-08-18 LAB — CBC
Hematocrit: 45.8 % (ref 37.5–51.0)
Hemoglobin: 15.1 g/dL (ref 13.0–17.7)
MCH: 27.7 pg (ref 26.6–33.0)
MCHC: 33 g/dL (ref 31.5–35.7)
MCV: 84 fL (ref 79–97)
Platelets: 279 10*3/uL (ref 150–450)
RBC: 5.46 x10E6/uL (ref 4.14–5.80)
RDW: 14.3 % (ref 11.6–15.4)
WBC: 11.3 10*3/uL — ABNORMAL HIGH (ref 3.4–10.8)

## 2021-08-18 LAB — BASIC METABOLIC PANEL
BUN/Creatinine Ratio: 16 (ref 10–24)
BUN: 30 mg/dL — ABNORMAL HIGH (ref 8–27)
CO2: 23 mmol/L (ref 20–29)
Calcium: 9.9 mg/dL (ref 8.6–10.2)
Chloride: 100 mmol/L (ref 96–106)
Creatinine, Ser: 1.87 mg/dL — ABNORMAL HIGH (ref 0.76–1.27)
Glucose: 175 mg/dL — ABNORMAL HIGH (ref 70–99)
Potassium: 5.2 mmol/L (ref 3.5–5.2)
Sodium: 137 mmol/L (ref 134–144)
eGFR: 40 mL/min/{1.73_m2} — ABNORMAL LOW (ref >59)

## 2021-08-18 NOTE — Patient Instructions (Addendum)
Medication Instructions:  HOLD Lisinopril the morning of heart cath HOLD Lasix the morning heart cath  TAKE half of insulin the morning of heart cath  *If you need a refill on your cardiac medications before your next appointment, please call your pharmacy*  Lab Work: Your physician recommends that you return for lab work TODAY:  BMET CBC  If you have labs (blood work) drawn today and your tests are completely normal, you will receive your results only by: [employer] (if you have MyChart) OR A paper copy in the mail If you have any lab test that is abnormal or we need to change your treatment, we will call you to review the results.  Testing/Procedures: Your physician has requested that you have a cardiac catheterization. Cardiac catheterization is used to diagnose and/or treat various heart conditions. Doctors may recommend this procedure for a number of different reasons. The most common reason is to evaluate chest pain. Chest pain can be a symptom of coronary artery disease (CAD), and cardiac catheterization can show whether plaque is narrowing or blocking your heart's arteries. This procedure is also used to evaluate the valves, as well as measure the blood flow and oxygen levels in different parts of your heart. For further information please visit [email or web address]. Please follow instruction sheet, as given.  Follow-Up: At [facility], you and your health needs are our priority.  As part of our continuing mission to provide you with exceptional heart care, we have created designated Provider Care Teams.  These Care Teams include your primary Cardiologist (physician) and Advanced Practice Providers (APPs -  Physician Assistants and Nurse Practitioners) who all work together to provide you with the care you need, when you need it.  Your next appointment:   2 week(s)  The format for your next appointment:   In Person  Provider:   Shelva Majestic, MD  or  APP        Other  Instructions   [place] [address] Dept: [phone] Loc: [place]  08/18/2021  You are scheduled for a Cardiac Catheterization on Tuesday, November 15 with Dr. Shelva Majestic.  1. Please arrive at the [facility] (Main Entrance A) at [facility]: [address] [place], [place] [address] at 8:00 AM . Free valet parking service is available.   Special note: Every effort is made to have your procedure done on time. Please understand that emergencies sometimes delay scheduled procedures.  2. Diet: Do not eat solid foods after midnight.  The patient may have clear liquids until 5am upon the day of the procedure.  3. Labs: You will need to have blood drawn on Friday, November 11 at [place]  Open: [place] (Lunch 12:30 - 1:30)   Phone: [phone]. You do not need to be fasting.  4. Medication instructions in preparation for your procedure:   Contrast Allergy: No  Stop taking, Lasix (Furosemide)  and Lisinopril  Tuesday, November 15,  Take 1/2 dose of insulins the night before heart cath   On the morning of your procedure, take your Aspirin and any morning medicines NOT listed above.  You may use sips of water.  5. Plan for one night stay--bring personal belongings. 6. Bring a current list of your medications and current insurance cards. 7. You MUST have a responsible person to drive you home. 8. Someone MUST  be with you the first 24 hours after you arrive home or your discharge will be delayed. 9. Please wear clothes that are easy to get on and off and wear slip-on shoes.  Thank you for allowing [place] to care for you!   -- [facility]

## 2021-08-18 NOTE — Progress Notes (Signed)
Cardiology Office Note   Date:  08/18/2021   ID:  Robert [name], DOB Mar 05, 1961, MRN [id number]  PCP:  Haywood Pao, MD  Cardiologist:  Shelva Majestic, MD EP: None  Chief Complaint  Patient presents with   Dyspnea on exertion       History of Present Illness: Robert [name] is a 60 y.o. male aortic stenosis, PSVT s/p ablation in 2018, HTN, HLD, DM type II, OSA obesity, and CKD stage IIIb s/p nephrectomy at age 89 for congenital nonfunctioning left kidney and subsequent renal transplant 2018, who presents for evaluation of progressive DOE.  He was last evaluated by cardiology and an outpatient visit with Dr. Claiborne Billings 07/12/2021 at which time he was without complaints of chest pain.  He was encouraged to discuss his progressive increasing creatinine and potential risk associated with future heart catheterization if necessary with his nephrologist.  Otherwise no medication changes occurred and he was recommended to follow-up in 6 weeks.  His last echocardiogram 04/2021 showed EF 60-65%, no R WMA, moderate LVH, G2 DD, moderate LAE, mild MR with moderate to severe MAC, moderate to severe aortic stenosis with mean gradient 44.4 mmHg and peak gradient 63.6 mmHg, and mild dilation of the ascending aorta (42 mm).  His last ischemic evaluation was an echo stress test in 2017 which showed no evidence of ischemia the EKG did show some changes in recovery.  He contacted our office 08/09/2021 to report intermittent chest with activity with associated diaphoresis and nausea.  He was advised to go to the ER however patient insisted that Dr. Claiborne Billings be notified of his complaints.  He was scheduled for this visit as a result.  He presents today for follow-up of his progressive DOE.  Since he was seen 08/01/2021 he has noticed shortness of breath occurring with less and less activity.  He has had several occasions where he has had drenching sweats and nausea.  He does not note any frank chest discomfort  but has some tightness in his neck.  He has also been dealing with vertigo after fall off a ladder though he did not have any head strike or loss of consciousness with this.  He did note an injury to his right leg and left shoulder.  He had a skin abrasion to his [name] which resulted in significant swelling.  He reports receiving antibiotics from his PCP for possible cellulitis.  He has not had any palpitations, syncope, orthopnea, PND.  He reports compliance with his CPAP.   Past Medical History:  Diagnosis Date   Anemia    low hgb at present   Arthritis    FEET   Blood transfusion without reported diagnosis 1974   kidney removed - L   Cancer ([place])    skin ca right shoulder, plastic dsyplasia, pre-Ca polpys removed on Colonoscopy- 07/2014   Colon polyps 07/23/2014   Tubular adenomas x 5   Constipation    Diabetes mellitus without complication ([place])    Type 2   Difficult intubation    unsure of actual problem but it was during the January 28, 2015 procedure.   Dysrhythmia    Eczema    ESRD (end stage renal disease) (HCC)    hEMO mwf   Headache(784.0)    slight dull h/a due kidney failure   Hyperlipidemia    Hypertension    SVT h/o , followed by Dr. Claiborne Billings   Hypothyroidism    Macular degeneration    Neuropathy  right hand and both feet   PAT (paroxysmal atrial tachycardia) (HCC)    PSVT (paroxysmal supraventricular tachycardia) (HCC)    SBO (small bowel obstruction) (HCC)    Shortness of breath    with exertion   Sleep apnea 06/18/11   split-night sleep study- [facility]., BiPAP- 13-16   Thyroid disease    Umbilical hernia    will repair with thi surgery    Past Surgical History:  Procedure Laterality Date   AV FISTULA PLACEMENT Right 02/25/2015   Procedure: RIGHT ARTERIOVENOUS (AV) FISTULA CREATION;  Surgeon: Serafina Mitchell, MD;  Location: [place];  Service: Vascular;  Laterality: Right;   AV FISTULA PLACEMENT Right 09/15/2015   Procedure:  ARTERIOVENOUS (AV) FISTULA CREATION- RIGHT ARM;  Surgeon: Serafina Mitchell, MD;  Location: [place];  Service: Vascular;  Laterality: Right;   CAPD INSERTION N/A 05/18/2014   Procedure: LAPAROSCOPIC INSERTION CONTINUOUS AMBULATORY PERITONEAL DIALYSIS  (CAPD) CATHETER;  Surgeon: Ralene Ok, MD;  Location: [place];  Service: General;  Laterality: N/A;   COLONOSCOPY     COLONOSCOPY WITH PROPOFOL N/A 07/12/2016   Procedure: COLONOSCOPY WITH PROPOFOL;  Surgeon: [name], MD;  Location: [facility];  Service: Endoscopy;  Laterality: N/A;   declotting of fistula  08/2015   ESOPHAGOGASTRODUODENOSCOPY (EGD) WITH PROPOFOL N/A 07/12/2016   Procedure: ESOPHAGOGASTRODUODENOSCOPY (EGD) WITH PROPOFOL;  Surgeon: [name], MD;  Location: [facility];  Service: Endoscopy;  Laterality: N/A;   FISTULA SUPERFICIALIZATION Right 02/07/2016   Procedure: RIIGHT UPPER ARM FISTULA SUPERFICIALIZATION;  Surgeon: Elam Dutch, MD;  Location: [place];  Service: Vascular;  Laterality: Right;   IJ catheter insertion     INSERTION OF DIALYSIS CATHETER Right 02/25/2015   Procedure: INSERTION OF RIGHT INTERNAL JUGULAR DIALYSIS CATHETER;  Surgeon: Serafina Mitchell, MD;  Location: [place] [facility];  Service: Vascular;  Laterality: Right;   KIDNEY TRANSPLANT     LAPAROSCOPIC REPOSITIONING CAPD CATHETER N/A 06/16/2014   Procedure: LAPAROSCOPIC REPOSITIONING CAPD CATHETER;  Surgeon: Ralene Ok, MD;  Location: [place];  Service: General;  Laterality: N/A;   LAPAROSCOPY N/A 05/04/2015   Procedure: LAPAROSCOPY DIAGNOSTIC LYSIS OF ADHESIONS;  Surgeon: Coralie Keens, MD;  Location: [place];  Service: General;  Laterality: N/A;   MINOR REMOVAL OF PERITONEAL DIALYSIS CATHETER N/A 04/28/2015   Procedure:  REMOVAL OF PERITONEAL DIALYSIS CATHETER;  Surgeon: Coralie Keens, MD;  Location: [place];  Service: General;  Laterality: N/A;   NEPHRECTOMY Left 1974   RENAL BIOPSY Right 2012   REVISON OF ARTERIOVENOUS FISTULA Right 05/26/2015    Procedure: SUPERFICIALIZATION OF ARTERIOVENOUS FISTULA WITH SIDE BRANCH LIGATIONS;  Surgeon: Serafina Mitchell, MD;  Location: [place];  Service: Vascular;  Laterality: Right;   SKIN CANCER EXCISION     right shoulder   SVT ABLATION N/A 11/23/2016   Procedure: SVT Ablation;  Surgeon: Will Meredith Leeds, MD;  Location: [facility] [facility];  Service: Cardiovascular;  Laterality: N/A;   UMBILICAL HERNIA REPAIR N/A 05/18/2014   Procedure: HERNIA REPAIR UMBILICAL ADULT;  Surgeon: Ralene Ok, MD;  Location: [place];  Service: General;  Laterality: N/A;   UMBILICAL HERNIA REPAIR N/A 04/28/2015   Procedure: UMBILICAL HERNIA REPAIR WITH MESH;  Surgeon: Coralie Keens, MD;  Location: [place];  Service: General;  Laterality: N/A;     Current Outpatient Medications  Medication Sig Dispense Refill   ACCU-CHEK FASTCLIX LANCETS MISC Use lancets to check blood sugar 5 times daily. 500 each 3   amLODipine (  NORVASC) 10 MG tablet TAKE 1 TABLET BY MOUTH EVERY DAY 90 tablet 1   amoxicillin (AMOXIL) 500 MG tablet Take 1,000 mg by mouth 2 (two) times daily. Before dental visits     aspirin EC 81 MG tablet Take 81 mg by mouth daily.     B-D UF III MINI PEN NEEDLES 31G X 5 MM MISC USE 4 PER DAY TO INJECT INSULIN. 100 each 5   BD INSULIN SYRINGE U/F 31G X 5/16" 1 ML MISC USE 3 TIMES A DAY WITH HUMULIN R 90 each 2   belatacept (NULOJIX) 250 MG SOLR injection Inject 24.5 mLs into the vein every 28 (twenty-eight) days.      Blood Glucose Monitoring Suppl (ACCU-CHEK GUIDE) w/Device KIT 1 each by Does not apply route daily. Use meter to check blood sugar 5 times daily. 1 kit 0   Blood Glucose Monitoring Suppl (FIFTY50 GLUCOSE METER 2.0) w/Device KIT Use as directed     Continuous Blood Gluc Sensor (FREESTYLE LIBRE 3 SENSOR) MISC 1 each by Does not apply route every 14 (fourteen) days. Place 1 sensor on the skin every 14 days. Use to check glucose continuously 6 each 3   DULoxetine (CYMBALTA) 60 MG capsule Take 60 mg by mouth  daily.     fluocinonide cream (LIDEX) 9.62 % Apply 1 application topically daily as needed (Skin inflammation (hand, thigh, back)).      furosemide (LASIX) 20 MG tablet Take 20 mg by mouth daily. Takes As Needed     gabapentin (NEURONTIN) 100 MG capsule Take 200-300 mg by mouth See admin instructions. 200 mg in the morning and 300 mg at bedtime     glucose blood (ACCU-CHEK GUIDE) test strip 1 each by Other route as needed for other. Use as instructed to check blood sugar 5 times daily. 500 each 3   HUMULIN R 500 UNIT/ML injection USE MAX 200 UNITS IN PUMP EVERY THREE DAYS 60 mL 2   insulin aspart (NOVOLOG FLEXPEN) 100 UNIT/ML FlexPen      insulin degludec (TRESIBA) 100 UNIT/ML FlexTouch Pen Inject into the skin.     Insulin Syringe-Needle U-100 (BD INSULIN SYRINGE ULTRAFINE) 31G X 5/16" 0.3 ML MISC Use to inject insulin 2-3 times per day 300 each 1   lactulose (CHRONULAC) 10 GM/15ML solution Take 10 g by mouth daily as needed.      Lancets Misc. (ACCU-CHEK FASTCLIX LANCET) KIT 1 each by Does not apply route daily. 1 kit 0   levothyroxine (SYNTHROID) 100 MCG tablet TAKE 1 TABLET BY MOUTH EVERY DAY 90 tablet 3   lisinopril (ZESTRIL) 20 MG tablet Take 20 mg by mouth 2 (two) times daily.     metoprolol tartrate (LOPRESSOR) 50 MG tablet Take 1 tablet (50 mg total) by mouth 2 (two) times daily. 180 tablet 3   mycophenolate (CELLCEPT) 250 MG capsule Take 1,000 mg by mouth every 12 (twelve) hours.      NEEDLE, DISP, 25 G (B-D DISP NEEDLE 25GX1") 25G X 1" MISC USE TO INJECT TESTOSTERONE (EXCHANGE NEEDLE FROM 22G 3ML SYRINGE) 200 each 2   predniSONE (DELTASONE) 5 MG tablet Take 5 mg by mouth daily with breakfast.     rosuvastatin (CRESTOR) 20 MG tablet Take 20 mg by mouth at bedtime.     senna-docusate (SENOKOT-S) 8.6-50 MG tablet Take 1 tablet by mouth as needed for mild constipation.     Syringe, Disposable, (SYRINGE LUER LOCK) 3 ML MISC Use 1 syringe once weekly to inject Testosterone medication.  100  each 1   tamsulosin (FLOMAX) 0.4 MG CAPS capsule Take 0.8 mg by mouth at bedtime.      testosterone cypionate (DEPOTESTOSTERONE CYPIONATE) 200 MG/ML injection Inject 0.6 mLs (120 mg total) into the muscle every 7 (seven) days. 10 mL 1   traMADol (ULTRAM) 50 MG tablet Take 1 tablet (50 mg total) by mouth every 6 (six) hours as needed. (Patient taking differently: Take 50 mg by mouth every 6 (six) hours as needed. Takes 1 to 2 tabs every 6 hours) 15 tablet 0   traZODone (DESYREL) 50 MG tablet Take 50-100 mg by mouth at bedtime as needed for sleep.      UNABLE TO FIND BiPAP: At bedtime     zolpidem (AMBIEN) 10 MG tablet Take 10-20 mg by mouth at bedtime as needed for sleep.      No current facility-administered medications for this visit.    Allergies:   Patient has no known allergies.    Social History:  The patient  reports that he quit smoking about 8 years ago. His smoking use included cigars. He has never used smokeless tobacco. He reports current alcohol use. He reports that he does not use drugs.   Family History:  The patient's family history includes Colon polyps in his father.    ROS:  Please see the history of present illness.   Otherwise, review of systems are positive for none.   All other systems are reviewed and negative.    PHYSICAL EXAM: VS:  BP 116/70   Pulse 79   Ht _0  (1.803 m)   Wt 289 lb 3.2 oz (131.2 kg)   SpO2 97%   BMI 40.34 kg/m  , BMI Body mass index is 40.34 kg/m. GEN: Well nourished, well developed, in no acute distress HEENT: Sclera anicteric Neck: no JVD, carotid bruits, or masses Cardiac: RRR; no murmurs, rubs, or gallops, 2+ [name] edema with mild erythema Respiratory:  clear to auscultation bilaterally, normal work of breathing GI: soft, obese, nontender, nondistended, + BS MS: no deformity or atrophy Skin: warm and dry, no rash Neuro:  Strength and sensation are intact Psych: euthymic mood, full affect   EKG:  EKG is ordered today. The ekg  ordered today demonstrates sinus rhythm, rate 79 bpm, inferior lateral T wave inversions, no ST E/D; no significant change from previous   Recent Labs: 12/13/2020: Hemoglobin 14.2; Platelets 232.0 07/04/2021: BUN 31; Creatinine, Ser 1.93; Potassium 3.7; Sodium 140; TSH 1.15    Lipid Panel    Component Value Date/Time   CHOL 148 07/04/2021 0905   TRIG 54.0 07/04/2021 0905   HDL 69.70 07/04/2021 0905   CHOLHDL 2 07/04/2021 0905   VLDL 10.8 07/04/2021 0905   LDLCALC 67 07/04/2021 0905      Wt Readings from Last 3 Encounters:  08/18/21 289 lb 3.2 oz (131.2 kg)  08/01/21 294 lb 3.2 oz (133.4 kg)  07/11/21 292 lb (132.5 kg)      Other studies Reviewed: Additional studies/ records that were reviewed today include:   Echocardiogram 04/2021: 1. Left ventricular ejection fraction, by estimation, is 60 to 65%. The  left ventricle has normal function. The left ventricle has no regional  wall motion abnormalities. There is moderate left ventricular hypertrophy.  Left ventricular diastolic  parameters are consistent with Grade II diastolic dysfunction  (pseudonormalization). Elevated left ventricular end-diastolic pressure.   2. Right ventricular systolic function is normal. The right ventricular  size is normal. There is moderately elevated pulmonary  artery systolic  pressure.   3. Left atrial size was moderately dilated.   4. The mitral valve is abnormal. Mild mitral valve regurgitation.  Moderate to severe mitral annular calcification.   5. The aortic valve is tricuspid. There is moderate calcification of the  aortic valve. Aortic valve regurgitation is trivial. Moderate to severe  aortic valve stenosis. Aortic valve area, by VTI measures 1.09 cm. Aortic  valve mean gradient measures 44.4   mmHg. Aortic valve Vmax measures 3.99 m/s. DI is 0.29 - based on LVOT  diameter of 2.2 cm.   6. Aortic dilatation noted. There is mild dilatation of the ascending  aorta, measuring 42 mm.   7.  The inferior vena cava is dilated in size with >50% respiratory  variability, suggesting right atrial pressure of 8 mmHg.   Stress echocardiogram 2017: Study Conclusions   - Stress ECG conclusions: There were no stress arrhythmias or    conduction abnormalities. 1 mm ST segment depression with    inverted [place] in recovery.  - Staged echo: There was no echocardiographic evidence for    stress-induced ischemia.  - Impressions: [place] study with normal echo images and positive    ECG in recovery Consider f/u cardiac CT or other imaging   Impressions:   - [place] study with normal echo images and positive ECG in    recovery Consider f/u cardiac CT or other imaging   ASSESSMENT AND PLAN:  1.  Aortic stenosis: Moderate to severe on echo 04/2021 with mean gradient 44.4 mmHg and peak gradient 63.6 mmHg.  Had complaints of DOE and exertional fatigue at visit with Dr. Claiborne Billings 08/01/2021, however in the interim has started experiencing chest pain with activity with associated diaphoresis and nausea.  He was recommended to discuss with his nephrologist risk for cardiac cath given progression of CKD towards stage IV.  Since his last visit he has had progressive DOE and neck discomfort with activity.  He has not had any definitive ischemic testing.  Difficult to say whether symptoms are due to angina versus aortic stenosis.  He is currently pain-free. -We will plan for [facility] 08/22/2021 with Dr. Claiborne Billings to rule out ischemia and evaluate severity of his aortic stenosis. - Shared Decision Making/Informed Consent{ The risks [stroke (1 in 1000), death (1 in 1000), kidney failure [usually temporary] (1 in 500), bleeding (1 in 200), allergic reaction [possibly serious] (1 in 200)], benefits (diagnostic support and management of coronary artery disease) and alternatives of a cardiac catheterization were discussed in detail with Mr. Ginger and he is willing to proceed.  -We will have him present several hours  early to receive IV fluids prior to his cath given history of CKD stage IIIb and renal transplant -He will hold his Lasix and lisinopril on the day of his cath and reduce his insulin the night before -Continue aspirin -Continue Lasix  2.  HTN: BP 116/70 today -Continue metoprolol, amlodipine, lisinopril, and Lasix  3.  HLD: LDL 67 on labs 06/2021 -Continue Crestor  4.  DM type II: A1c 8.1 07/10/2021; goal less than 7 -Continue insulin per PCP  5.  CKD stage IIIb inpatient with history of left nephrectomy for congenital nonfunctioning kidney s/p renal transplant (living donor) in 2018: Creatinine 1.8 on last check which was performed after his visit with Dr. Claiborne Billings 08/01/2021.  He saw his nephrologist at [facility] who gave his blessing for heart catheterization with recommendations to administer hydration prior to.   - Continue Nulojix -Continue CellCept  6.  OSA: Compliant with CPAP -Continue CPAP use  7.  [name] abrasion: He does have some ongoing swelling and erythema but no tenderness to palpation or increased warmth.   -Encourage close PCP follow-up with completion of antibiotics.  8.  Vertigo: Symptoms seem to have started with his fall from the ladder.  Suspect inner ear etiology.  Symptoms worse with looking up, closing his eyes, position changes. -Encourage PCP follow-up if this persists at which time he may benefit from ENT evaluation. -Could consider carotid Dopplers if persistent symptoms at follow-up.   Current medicines are reviewed at length with the patient today.  The patient does not have concerns regarding medicines.  The following changes have been made: As above  Labs/ tests ordered today include:   Orders Placed This Encounter  Procedures   CBC   Basic metabolic panel   EKG 07-DPBA     Disposition:   FU with Dr. Claiborne Billings as scheduled 09/18/2021  Signed, Abigail Butts, PA-C  08/18/2021 10:31 AM

## 2021-08-18 NOTE — H&P (View-Only) (Signed)
Cardiology Office Note   Date:  08/18/2021   ID:  Robert [name], DOB Mar 05, 1961, MRN [id number]  PCP:  Haywood Pao, MD  Cardiologist:  Shelva Majestic, MD EP: None  Chief Complaint  Patient presents with   Dyspnea on exertion       History of Present Illness: Robert [name] is a 60 y.o. male aortic stenosis, PSVT s/p ablation in 2018, HTN, HLD, DM type II, OSA obesity, and CKD stage IIIb s/p nephrectomy at age 89 for congenital nonfunctioning left kidney and subsequent renal transplant 2018, who presents for evaluation of progressive DOE.  He was last evaluated by cardiology and an outpatient visit with Dr. Claiborne Billings 07/12/2021 at which time he was without complaints of chest pain.  He was encouraged to discuss his progressive increasing creatinine and potential risk associated with future heart catheterization if necessary with his nephrologist.  Otherwise no medication changes occurred and he was recommended to follow-up in 6 weeks.  His last echocardiogram 04/2021 showed EF 60-65%, no R WMA, moderate LVH, G2 DD, moderate LAE, mild MR with moderate to severe MAC, moderate to severe aortic stenosis with mean gradient 44.4 mmHg and peak gradient 63.6 mmHg, and mild dilation of the ascending aorta (42 mm).  His last ischemic evaluation was an echo stress test in 2017 which showed no evidence of ischemia the EKG did show some changes in recovery.  He contacted our office 08/09/2021 to report intermittent chest with activity with associated diaphoresis and nausea.  He was advised to go to the ER however patient insisted that Dr. Claiborne Billings be notified of his complaints.  He was scheduled for this visit as a result.  He presents today for follow-up of his progressive DOE.  Since he was seen 08/01/2021 he has noticed shortness of breath occurring with less and less activity.  He has had several occasions where he has had drenching sweats and nausea.  He does not note any frank chest discomfort  but has some tightness in his neck.  He has also been dealing with vertigo after fall off a ladder though he did not have any head strike or loss of consciousness with this.  He did note an injury to his right leg and left shoulder.  He had a skin abrasion to his [name] which resulted in significant swelling.  He reports receiving antibiotics from his PCP for possible cellulitis.  He has not had any palpitations, syncope, orthopnea, PND.  He reports compliance with his CPAP.   Past Medical History:  Diagnosis Date   Anemia    low hgb at present   Arthritis    FEET   Blood transfusion without reported diagnosis 1974   kidney removed - L   Cancer ([place])    skin ca right shoulder, plastic dsyplasia, pre-Ca polpys removed on Colonoscopy- 07/2014   Colon polyps 07/23/2014   Tubular adenomas x 5   Constipation    Diabetes mellitus without complication ([place])    Type 2   Difficult intubation    unsure of actual problem but it was during the January 28, 2015 procedure.   Dysrhythmia    Eczema    ESRD (end stage renal disease) (HCC)    hEMO mwf   Headache(784.0)    slight dull h/a due kidney failure   Hyperlipidemia    Hypertension    SVT h/o , followed by Dr. Claiborne Billings   Hypothyroidism    Macular degeneration    Neuropathy  right hand and both feet   PAT (paroxysmal atrial tachycardia) (HCC)    PSVT (paroxysmal supraventricular tachycardia) (HCC)    SBO (small bowel obstruction) (HCC)    Shortness of breath    with exertion   Sleep apnea 06/18/11   split-night sleep study- [facility]., BiPAP- 13-16   Thyroid disease    Umbilical hernia    will repair with thi surgery    Past Surgical History:  Procedure Laterality Date   AV FISTULA PLACEMENT Right 02/25/2015   Procedure: RIGHT ARTERIOVENOUS (AV) FISTULA CREATION;  Surgeon: Serafina Mitchell, MD;  Location: [place];  Service: Vascular;  Laterality: Right;   AV FISTULA PLACEMENT Right 09/15/2015   Procedure:  ARTERIOVENOUS (AV) FISTULA CREATION- RIGHT ARM;  Surgeon: Serafina Mitchell, MD;  Location: [place];  Service: Vascular;  Laterality: Right;   CAPD INSERTION N/A 05/18/2014   Procedure: LAPAROSCOPIC INSERTION CONTINUOUS AMBULATORY PERITONEAL DIALYSIS  (CAPD) CATHETER;  Surgeon: Ralene Ok, MD;  Location: [place];  Service: General;  Laterality: N/A;   COLONOSCOPY     COLONOSCOPY WITH PROPOFOL N/A 07/12/2016   Procedure: COLONOSCOPY WITH PROPOFOL;  Surgeon: [name], MD;  Location: [facility];  Service: Endoscopy;  Laterality: N/A;   declotting of fistula  08/2015   ESOPHAGOGASTRODUODENOSCOPY (EGD) WITH PROPOFOL N/A 07/12/2016   Procedure: ESOPHAGOGASTRODUODENOSCOPY (EGD) WITH PROPOFOL;  Surgeon: [name], MD;  [facility]: [facility];  Service: Endoscopy;  Laterality: N/A;   FISTULA SUPERFICIALIZATION Right 02/07/2016   Procedure: RIIGHT UPPER ARM FISTULA SUPERFICIALIZATION;  Surgeon: Elam Dutch, MD;  Location: [place];  Service: Vascular;  Laterality: Right;   IJ catheter insertion     INSERTION OF DIALYSIS CATHETER Right 02/25/2015   Procedure: INSERTION OF RIGHT INTERNAL JUGULAR DIALYSIS CATHETER;  Surgeon: Serafina Mitchell, MD;  Location: [place] [facility];  Service: Vascular;  Laterality: Right;   KIDNEY TRANSPLANT     LAPAROSCOPIC REPOSITIONING CAPD CATHETER N/A 06/16/2014   Procedure: LAPAROSCOPIC REPOSITIONING CAPD CATHETER;  Surgeon: Ralene Ok, MD;  Location: [place];  Service: General;  Laterality: N/A;   LAPAROSCOPY N/A 05/04/2015   Procedure: LAPAROSCOPY DIAGNOSTIC LYSIS OF ADHESIONS;  Surgeon: Coralie Keens, MD;  Location: [place];  Service: General;  Laterality: N/A;   MINOR REMOVAL OF PERITONEAL DIALYSIS CATHETER N/A 04/28/2015   Procedure:  REMOVAL OF PERITONEAL DIALYSIS CATHETER;  Surgeon: Coralie Keens, MD;  Location: [place];  Service: General;  Laterality: N/A;   NEPHRECTOMY Left 1974   RENAL BIOPSY Right 2012   REVISON OF ARTERIOVENOUS FISTULA Right 05/26/2015    Procedure: SUPERFICIALIZATION OF ARTERIOVENOUS FISTULA WITH SIDE BRANCH LIGATIONS;  Surgeon: Serafina Mitchell, MD;  Location: [place];  Service: Vascular;  Laterality: Right;   SKIN CANCER EXCISION     right shoulder   SVT ABLATION N/A 11/23/2016   Procedure: SVT Ablation;  Surgeon: Will Meredith Leeds, MD;  Location: [place] [facility];  Service: Cardiovascular;  Laterality: N/A;   UMBILICAL HERNIA REPAIR N/A 05/18/2014   Procedure: HERNIA REPAIR UMBILICAL ADULT;  Surgeon: Ralene Ok, MD;  Location: [place];  Service: General;  Laterality: N/A;   UMBILICAL HERNIA REPAIR N/A 04/28/2015   Procedure: UMBILICAL HERNIA REPAIR WITH MESH;  Surgeon: Coralie Keens, MD;  Location: [place];  Service: General;  Laterality: N/A;     Current Outpatient Medications  Medication Sig Dispense Refill   ACCU-CHEK FASTCLIX LANCETS MISC Use lancets to check blood sugar 5 times daily. 500 each 3   amLODipine (  NORVASC) 10 MG tablet TAKE 1 TABLET BY MOUTH EVERY DAY 90 tablet 1   amoxicillin (AMOXIL) 500 MG tablet Take 1,000 mg by mouth 2 (two) times daily. Before dental visits     aspirin EC 81 MG tablet Take 81 mg by mouth daily.     B-D UF III MINI PEN NEEDLES 31G X 5 MM MISC USE 4 PER DAY TO INJECT INSULIN. 100 each 5   BD INSULIN SYRINGE U/F 31G X 5/16" 1 ML MISC USE 3 TIMES A DAY WITH HUMULIN R 90 each 2   belatacept (NULOJIX) 250 MG SOLR injection Inject 24.5 mLs into the vein every 28 (twenty-eight) days.      Blood Glucose Monitoring Suppl (ACCU-CHEK GUIDE) w/Device KIT 1 each by Does not apply route daily. Use meter to check blood sugar 5 times daily. 1 kit 0   Blood Glucose Monitoring Suppl (FIFTY50 GLUCOSE METER 2.0) w/Device KIT Use as directed     Continuous Blood Gluc Sensor (FREESTYLE LIBRE 3 SENSOR) MISC 1 each by Does not apply route every 14 (fourteen) days. Place 1 sensor on the skin every 14 days. Use to check glucose continuously 6 each 3   DULoxetine (CYMBALTA) 60 MG capsule Take 60 mg by mouth  daily.     fluocinonide cream (LIDEX) 9.62 % Apply 1 application topically daily as needed (Skin inflammation (hand, thigh, back)).      furosemide (LASIX) 20 MG tablet Take 20 mg by mouth daily. Takes As Needed     gabapentin (NEURONTIN) 100 MG capsule Take 200-300 mg by mouth See admin instructions. 200 mg in the morning and 300 mg at bedtime     glucose blood (ACCU-CHEK GUIDE) test strip 1 each by Other route as needed for other. Use as instructed to check blood sugar 5 times daily. 500 each 3   HUMULIN R 500 UNIT/ML injection USE MAX 200 UNITS IN PUMP EVERY THREE DAYS 60 mL 2   insulin aspart (NOVOLOG FLEXPEN) 100 UNIT/ML FlexPen      insulin degludec (TRESIBA) 100 UNIT/ML FlexTouch Pen Inject into the skin.     Insulin Syringe-Needle U-100 (BD INSULIN SYRINGE ULTRAFINE) 31G X 5/16" 0.3 ML MISC Use to inject insulin 2-3 times per day 300 each 1   lactulose (CHRONULAC) 10 GM/15ML solution Take 10 g by mouth daily as needed.      Lancets Misc. (ACCU-CHEK FASTCLIX LANCET) KIT 1 each by Does not apply route daily. 1 kit 0   levothyroxine (SYNTHROID) 100 MCG tablet TAKE 1 TABLET BY MOUTH EVERY DAY 90 tablet 3   lisinopril (ZESTRIL) 20 MG tablet Take 20 mg by mouth 2 (two) times daily.     metoprolol tartrate (LOPRESSOR) 50 MG tablet Take 1 tablet (50 mg total) by mouth 2 (two) times daily. 180 tablet 3   mycophenolate (CELLCEPT) 250 MG capsule Take 1,000 mg by mouth every 12 (twelve) hours.      NEEDLE, DISP, 25 G (B-D DISP NEEDLE 25GX1") 25G X 1" MISC USE TO INJECT TESTOSTERONE (EXCHANGE NEEDLE FROM 22G 3ML SYRINGE) 200 each 2   predniSONE (DELTASONE) 5 MG tablet Take 5 mg by mouth daily with breakfast.     rosuvastatin (CRESTOR) 20 MG tablet Take 20 mg by mouth at bedtime.     senna-docusate (SENOKOT-S) 8.6-50 MG tablet Take 1 tablet by mouth as needed for mild constipation.     Syringe, Disposable, (SYRINGE LUER LOCK) 3 ML MISC Use 1 syringe once weekly to inject Testosterone medication.  100  each 1   tamsulosin (FLOMAX) 0.4 MG CAPS capsule Take 0.8 mg by mouth at bedtime.      testosterone cypionate (DEPOTESTOSTERONE CYPIONATE) 200 MG/ML injection Inject 0.6 mLs (120 mg total) into the muscle every 7 (seven) days. 10 mL 1   traMADol (ULTRAM) 50 MG tablet Take 1 tablet (50 mg total) by mouth every 6 (six) hours as needed. (Patient taking differently: Take 50 mg by mouth every 6 (six) hours as needed. Takes 1 to 2 tabs every 6 hours) 15 tablet 0   traZODone (DESYREL) 50 MG tablet Take 50-100 mg by mouth at bedtime as needed for sleep.      UNABLE TO FIND BiPAP: At bedtime     zolpidem (AMBIEN) 10 MG tablet Take 10-20 mg by mouth at bedtime as needed for sleep.      No current facility-administered medications for this visit.    Allergies:   Patient has no known allergies.    Social History:  The patient  reports that he quit smoking about 8 years ago. His smoking use included cigars. He has never used smokeless tobacco. He reports current alcohol use. He reports that he does not use drugs.   Family History:  The patient's family history includes Colon polyps in his father.    ROS:  Please see the history of present illness.   Otherwise, review of systems are positive for none.   All other systems are reviewed and negative.    PHYSICAL EXAM: VS:  BP 116/70   Pulse 79   Ht _0  (1.803 m)   Wt 289 lb 3.2 oz (131.2 kg)   SpO2 97%   BMI 40.34 kg/m  , BMI Body mass index is 40.34 kg/m. GEN: Well nourished, well developed, in no acute distress HEENT: Sclera anicteric Neck: no JVD, carotid bruits, or masses Cardiac: RRR; no murmurs, rubs, or gallops, 2+ [name] edema with mild erythema Respiratory:  clear to auscultation bilaterally, normal work of breathing GI: soft, obese, nontender, nondistended, + BS MS: no deformity or atrophy Skin: warm and dry, no rash Neuro:  Strength and sensation are intact Psych: euthymic mood, full affect   EKG:  EKG is ordered today. The ekg  ordered today demonstrates sinus rhythm, rate 79 bpm, inferior lateral T wave inversions, no ST E/D; no significant change from previous   Recent Labs: 12/13/2020: Hemoglobin 14.2; Platelets 232.0 07/04/2021: BUN 31; Creatinine, Ser 1.93; Potassium 3.7; Sodium 140; TSH 1.15    Lipid Panel    Component Value Date/Time   CHOL 148 07/04/2021 0905   TRIG 54.0 07/04/2021 0905   HDL 69.70 07/04/2021 0905   CHOLHDL 2 07/04/2021 0905   VLDL 10.8 07/04/2021 0905   LDLCALC 67 07/04/2021 0905      Wt Readings from Last 3 Encounters:  08/18/21 289 lb 3.2 oz (131.2 kg)  08/01/21 294 lb 3.2 oz (133.4 kg)  07/11/21 292 lb (132.5 kg)      Other studies Reviewed: Additional studies/ records that were reviewed today include:   Echocardiogram 04/2021: 1. Left ventricular ejection fraction, by estimation, is 60 to 65%. The  left ventricle has normal function. The left ventricle has no regional  wall motion abnormalities. There is moderate left ventricular hypertrophy.  Left ventricular diastolic  parameters are consistent with Grade II diastolic dysfunction  (pseudonormalization). Elevated left ventricular end-diastolic pressure.   2. Right ventricular systolic function is normal. The right ventricular  size is normal. There is moderately elevated pulmonary  artery systolic  pressure.   3. Left atrial size was moderately dilated.   4. The mitral valve is abnormal. Mild mitral valve regurgitation.  Moderate to severe mitral annular calcification.   5. The aortic valve is tricuspid. There is moderate calcification of the  aortic valve. Aortic valve regurgitation is trivial. Moderate to severe  aortic valve stenosis. Aortic valve area, by VTI measures 1.09 cm. Aortic  valve mean gradient measures 44.4   mmHg. Aortic valve Vmax measures 3.99 m/s. DI is 0.29 - based on LVOT  diameter of 2.2 cm.   6. Aortic dilatation noted. There is mild dilatation of the ascending  aorta, measuring 42 mm.   7.  The inferior vena cava is dilated in size with >50% respiratory  variability, suggesting right atrial pressure of 8 mmHg.   Stress echocardiogram 2017: Study Conclusions   - Stress ECG conclusions: There were no stress arrhythmias or    conduction abnormalities. 1 mm ST segment depression with    inverted [place] in recovery.  - Staged echo: There was no echocardiographic evidence for    stress-induced ischemia.  - Impressions: [place] study with normal echo images and positive    ECG in recovery Consider f/u cardiac CT or other imaging   Impressions:   - [place] study with normal echo images and positive ECG in    recovery Consider f/u cardiac CT or other imaging   ASSESSMENT AND PLAN:  1.  Aortic stenosis: Moderate to severe on echo 04/2021 with mean gradient 44.4 mmHg and peak gradient 63.6 mmHg.  Had complaints of DOE and exertional fatigue at visit with Dr. Claiborne Billings 08/01/2021, however in the interim has started experiencing chest pain with activity with associated diaphoresis and nausea.  He was recommended to discuss with his nephrologist risk for cardiac cath given progression of CKD towards stage IV.  Since his last visit he has had progressive DOE and neck discomfort with activity.  He has not had any definitive ischemic testing.  Difficult to say whether symptoms are due to angina versus aortic stenosis.  He is currently pain-free. -We will plan for [facility] 08/22/2021 with Dr. Claiborne Billings to rule out ischemia and evaluate severity of his aortic stenosis. - Shared Decision Making/Informed Consent{ The risks [stroke (1 in 1000), death (1 in 1000), kidney failure [usually temporary] (1 in 500), bleeding (1 in 200), allergic reaction [possibly serious] (1 in 200)], benefits (diagnostic support and management of coronary artery disease) and alternatives of a cardiac catheterization were discussed in detail with Mr. Ginger and he is willing to proceed.  -We will have him present several hours  early to receive IV fluids prior to his cath given history of CKD stage IIIb and renal transplant -He will hold his Lasix and lisinopril on the day of his cath and reduce his insulin the night before -Continue aspirin -Continue Lasix  2.  HTN: BP 116/70 today -Continue metoprolol, amlodipine, lisinopril, and Lasix  3.  HLD: LDL 67 on labs 06/2021 -Continue Crestor  4.  DM type II: A1c 8.1 07/10/2021; goal less than 7 -Continue insulin per PCP  5.  CKD stage IIIb inpatient with history of left nephrectomy for congenital nonfunctioning kidney s/p renal transplant (living donor) in 2018: Creatinine 1.8 on last check which was performed after his visit with Dr. Claiborne Billings 08/01/2021.  He saw his nephrologist at [facility] who gave his blessing for heart catheterization with recommendations to administer hydration prior to.   - Continue Nulojix -Continue CellCept  6.  OSA: Compliant with CPAP -Continue CPAP use  7.  [name] abrasion: He does have some ongoing swelling and erythema but no tenderness to palpation or increased warmth.   -Encourage close PCP follow-up with completion of antibiotics.  8.  Vertigo: Symptoms seem to have started with his fall from the ladder.  Suspect inner ear etiology.  Symptoms worse with looking up, closing his eyes, position changes. -Encourage PCP follow-up if this persists at which time he may benefit from ENT evaluation. -Could consider carotid Dopplers if persistent symptoms at follow-up.   Current medicines are reviewed at length with the patient today.  The patient does not have concerns regarding medicines.  The following changes have been made: As above  Labs/ tests ordered today include:   Orders Placed This Encounter  Procedures   CBC   Basic metabolic panel   EKG 28-VTVN     Disposition:   FU with Dr. Claiborne Billings as scheduled 09/18/2021  Signed, Abigail Butts, PA-C  08/18/2021 10:31 AM

## 2021-08-21 ENCOUNTER — Telehealth: Payer: Self-pay | Admitting: *Deleted

## 2021-08-21 NOTE — Telephone Encounter (Signed)
Cardiac catheterization scheduled at [facility] for: Tuesday August 22, 2021 [facility]) at: 8 AM-pre-procedure hydration Patient will try to get there closer to 7 AM,  but thinks that may be difficult.   No solid food after midnight prior to cath, clear liquids until 5 AM day of procedure.  Medication instructions: Hold: Lasix-day before and day of procedure-GFR 40-per protocol-pt takes prn Lisinopril-day before and day of procedure-GFR 40-per protocol Insulin pump-pt will manage 1/2 HS prior/none AM of procedure  Except hold medications usual morning medications can be taken pre-cath with sips of water including aspirin 81 mg.    Confirmed patient has responsible adult to drive home post procedure and be with patient first 24 hours after arriving home.  [facility] does allow one visitor to accompany you and wait in the hospital waiting room while you are there for your procedure. You and your visitor will be asked to wear a mask once you enter the hospital.   Patient reports does not currently have any new symptoms concerning for COVID-19 and no household members with COVID-19 like illness.   Reviewed procedure/mask/visitor instructions with patient.

## 2021-08-22 ENCOUNTER — Other Ambulatory Visit: Payer: Self-pay

## 2021-08-22 ENCOUNTER — Ambulatory Visit (HOSPITAL_COMMUNITY)
Admission: RE | Admit: 2021-08-22 | Discharge: 2021-08-22 | Disposition: A | Discharge status: Home / Self Care | Payer: 59 | Attending: Cardiovascular Disease | Admitting: Cardiovascular Disease

## 2021-08-22 ENCOUNTER — Encounter (HOSPITAL_COMMUNITY)
Admission: RE | Disposition: A | Discharge status: Home / Self Care | Payer: Self-pay | Source: Home / Self Care | Attending: Cardiovascular Disease

## 2021-08-22 DIAGNOSIS — E785 Hyperlipidemia, unspecified: Secondary | ICD-10-CM | POA: Insufficient documentation

## 2021-08-22 DIAGNOSIS — R0609 Other forms of dyspnea: Secondary | ICD-10-CM

## 2021-08-22 DIAGNOSIS — I2510 Atherosclerotic heart disease of native coronary artery without angina pectoris: Secondary | ICD-10-CM | POA: Diagnosis not present

## 2021-08-22 DIAGNOSIS — G4733 Obstructive sleep apnea (adult) (pediatric): Secondary | ICD-10-CM | POA: Diagnosis not present

## 2021-08-22 DIAGNOSIS — I129 Hypertensive chronic kidney disease with stage 1 through stage 4 chronic kidney disease, or unspecified chronic kidney disease: Secondary | ICD-10-CM | POA: Diagnosis not present

## 2021-08-22 DIAGNOSIS — N1832 Chronic kidney disease, stage 3b: Secondary | ICD-10-CM | POA: Diagnosis not present

## 2021-08-22 DIAGNOSIS — Z940 Kidney transplant status: Secondary | ICD-10-CM | POA: Insufficient documentation

## 2021-08-22 DIAGNOSIS — I350 Nonrheumatic aortic (valve) stenosis: Secondary | ICD-10-CM

## 2021-08-22 DIAGNOSIS — E1122 Type 2 diabetes mellitus with diabetic chronic kidney disease: Secondary | ICD-10-CM | POA: Insufficient documentation

## 2021-08-22 DIAGNOSIS — R42 Dizziness and giddiness: Secondary | ICD-10-CM | POA: Insufficient documentation

## 2021-08-22 HISTORY — PX: RIGHT/LEFT HEART CATH AND CORONARY ANGIOGRAPHY: CATH118266

## 2021-08-22 LAB — GLUCOSE, CAPILLARY
Glucose-Capillary: 122 mg/dL — ABNORMAL HIGH (ref 70–99)
Glucose-Capillary: 160 mg/dL — ABNORMAL HIGH (ref 70–99)
Glucose-Capillary: 139 mg/dL — ABNORMAL HIGH (ref 70–99)

## 2021-08-22 SURGERY — RIGHT/LEFT HEART CATH AND CORONARY ANGIOGRAPHY
Anesthesia: LOCAL

## 2021-08-22 MED ORDER — SODIUM CHLORIDE 0.9 % IV SOLN: INTRAVENOUS | Status: DC

## 2021-08-22 MED ORDER — HYDRALAZINE HCL 20 MG/ML IJ SOLN: 10.0000 mg | INTRAMUSCULAR | Status: DC | PRN

## 2021-08-22 MED ORDER — LIDOCAINE HCL (PF) 1 % IJ SOLN
INTRAMUSCULAR | Status: AC
  Filled 2021-08-22: qty 30

## 2021-08-22 MED ORDER — SODIUM CHLORIDE 0.9% FLUSH: 3.0000 mL | Freq: Two times a day (BID) | INTRAVENOUS | Status: DC

## 2021-08-22 MED ORDER — MIDAZOLAM HCL 2 MG/2ML IJ SOLN
INTRAMUSCULAR | Status: DC | PRN
  Administered 2021-08-22: 1 mg via INTRAVENOUS
  Administered 2021-08-22: 2 mg via INTRAVENOUS

## 2021-08-22 MED ORDER — MIDAZOLAM HCL 2 MG/2ML IJ SOLN
INTRAMUSCULAR | Status: AC
  Filled 2021-08-22: qty 2

## 2021-08-22 MED ORDER — ASPIRIN 81 MG PO CHEW: 81.0000 mg | CHEWABLE_TABLET | Freq: Every day | ORAL | Status: DC

## 2021-08-22 MED ORDER — ACETAMINOPHEN 325 MG PO TABS: 650.0000 mg | ORAL_TABLET | ORAL | Status: DC | PRN

## 2021-08-22 MED ORDER — HEPARIN (PORCINE) IN NACL 1000-0.9 UT/500ML-% IV SOLN
INTRAVENOUS | Status: AC
  Filled 2021-08-22: qty 500

## 2021-08-22 MED ORDER — IOHEXOL 350 MG/ML SOLN
INTRAVENOUS | Status: DC | PRN
  Administered 2021-08-22: 80 mL via INTRA_ARTERIAL

## 2021-08-22 MED ORDER — SODIUM CHLORIDE 0.9 % WEIGHT BASED INFUSION
3.0000 mL/kg/h | INTRAVENOUS | Status: AC
  Administered 2021-08-22: 3 mL/kg/h via INTRAVENOUS

## 2021-08-22 MED ORDER — DIAZEPAM 5 MG PO TABS: 5.0000 mg | ORAL_TABLET | Freq: Four times a day (QID) | ORAL | Status: DC | PRN

## 2021-08-22 MED ORDER — SODIUM CHLORIDE 0.9 % IV SOLN: 250.0000 mL | INTRAVENOUS | Status: DC | PRN

## 2021-08-22 MED ORDER — SODIUM CHLORIDE 0.9 % WEIGHT BASED INFUSION: 1.0000 mL/kg/h | INTRAVENOUS | Status: DC

## 2021-08-22 MED ORDER — LABETALOL HCL 5 MG/ML IV SOLN: 10.0000 mg | INTRAVENOUS | Status: DC | PRN

## 2021-08-22 MED ORDER — ONDANSETRON HCL 4 MG/2ML IJ SOLN: 4.0000 mg | Freq: Four times a day (QID) | INTRAMUSCULAR | Status: DC | PRN

## 2021-08-22 MED ORDER — HEPARIN (PORCINE) IN NACL 1000-0.9 UT/500ML-% IV SOLN
INTRAVENOUS | Status: DC | PRN
  Administered 2021-08-22 (×2): 500 mL

## 2021-08-22 MED ORDER — FENTANYL CITRATE (PF) 100 MCG/2ML IJ SOLN
INTRAMUSCULAR | Status: DC | PRN
  Administered 2021-08-22 (×3): 25 ug via INTRAVENOUS

## 2021-08-22 MED ORDER — FENTANYL CITRATE (PF) 100 MCG/2ML IJ SOLN
INTRAMUSCULAR | Status: AC
  Filled 2021-08-22: qty 2

## 2021-08-22 MED ORDER — LIDOCAINE HCL (PF) 1 % IJ SOLN
INTRAMUSCULAR | Status: DC | PRN
  Administered 2021-08-22: 15 mL via SUBCUTANEOUS

## 2021-08-22 MED ORDER — SODIUM CHLORIDE 0.9% FLUSH: 3.0000 mL | INTRAVENOUS | Status: DC | PRN

## 2021-08-22 SURGICAL SUPPLY — 12 items
CATH INFINITI 5FR AL1 (CATHETERS) ×2 IMPLANT
CATH INFINITI 5FR MULTPACK ANG (CATHETERS) ×2 IMPLANT
CATH SWAN GANZ 7F STRAIGHT (CATHETERS) ×2 IMPLANT
KIT HEART LEFT (KITS) ×3 IMPLANT
PACK CARDIAC CATHETERIZATION (CUSTOM PROCEDURE TRAY) ×3 IMPLANT
SHEATH PINNACLE 5F 10CM (SHEATH) ×2 IMPLANT
SHEATH PINNACLE 7F 10CM (SHEATH) ×2 IMPLANT
SYR MEDRAD MARK 7 150ML (SYRINGE) ×3 IMPLANT
TRANSDUCER W/STOPCOCK (MISCELLANEOUS) ×3 IMPLANT
TUBING CIL FLEX 10 FLL-RA (TUBING) ×3 IMPLANT
WIRE EMERALD 3MM-J .035X150CM (WIRE) ×2 IMPLANT
WIRE EMERALD ST .035X150CM (WIRE) ×2 IMPLANT

## 2021-08-22 NOTE — Progress Notes (Signed)
Site area: Rt fem art and venous sheaths Site Prior to Removal:  Level 0 Pressure Applied For: 20 min Manual:   yes Patient Status During Pull:  A/O Post Pull Site:  Level 0 Post Pull Instructions Given:  Post instructions given and pt understands Post Pull Pulses Present: 2+ rt Dp Dressing Applied:  Tegaderm applied and 4x4 Bedrest begins @ 1920 Comments:Pt leaves cath lab holding in stable condition. Rt groin is unremarkable.

## 2021-08-22 NOTE — Interval H&P Note (Signed)
History and Physical Interval Note:  08/22/2021 2:15 PM  Robert [name]  has presented today for surgery, with the diagnosis of stenosis.  The various methods of treatment have been discussed with the patient and family. After consideration of risks, benefits and other options for treatment, the patient has consented to  Procedure(s): RIGHT/LEFT HEART CATH AND CORONARY ANGIOGRAPHY (N/A) as a surgical intervention.  The patient's history has been reviewed, patient examined, no change in status, stable for surgery.  I have reviewed the patient's chart and labs.  Questions were answered to the patient's satisfaction.     Shelva Majestic

## 2021-08-23 ENCOUNTER — Encounter (HOSPITAL_COMMUNITY): Payer: Self-pay | Admitting: Cardiovascular Disease

## 2021-08-23 LAB — POCT I-STAT EG7
Acid-base deficit: 1 mmol/L (ref 0.0–2.0)
Acid-base deficit: 2 mmol/L (ref 0.0–2.0)
Bicarbonate: 24.7 mmol/L (ref 20.0–28.0)
Bicarbonate: 25.1 mmol/L (ref 20.0–28.0)
Calcium, Ion: 1.36 mmol/L (ref 1.15–1.40)
Calcium, Ion: 1.36 mmol/L (ref 1.15–1.40)
HCT: 42 % (ref 39.0–52.0)
HCT: 42 % (ref 39.0–52.0)
Hemoglobin: 14.3 g/dL (ref 13.0–17.0)
Hemoglobin: 14.3 g/dL (ref 13.0–17.0)
O2 Saturation: 75 %
O2 Saturation: 77 %
Potassium: 4.8 mmol/L (ref 3.5–5.1)
Potassium: 4.8 mmol/L (ref 3.5–5.1)
Sodium: 137 mmol/L (ref 135–145)
Sodium: 137 mmol/L (ref 135–145)
TCO2: 26 mmol/L (ref 22–32)
TCO2: 27 mmol/L (ref 22–32)
pCO2, Ven: 47.2 mmHg (ref 44.0–60.0)
pCO2, Ven: 48 mmHg (ref 44.0–60.0)
pH, Ven: 7.327 (ref 7.250–7.430)
pH, Ven: 7.327 (ref 7.250–7.430)
pO2, Ven: 43 mmHg (ref 32.0–45.0)
pO2, Ven: 45 mmHg (ref 32.0–45.0)

## 2021-08-23 LAB — POCT I-STAT 7, (LYTES, BLD GAS, ICA,H+H)
Acid-base deficit: 2 mmol/L (ref 0.0–2.0)
Bicarbonate: 23.2 mmol/L (ref 20.0–28.0)
Calcium, Ion: 1.33 mmol/L (ref 1.15–1.40)
HCT: 40 % (ref 39.0–52.0)
Hemoglobin: 13.6 g/dL (ref 13.0–17.0)
O2 Saturation: 98 %
Potassium: 4.8 mmol/L (ref 3.5–5.1)
Sodium: 136 mmol/L (ref 135–145)
TCO2: 24 mmol/L (ref 22–32)
pCO2 arterial: 40.9 mmHg (ref 32.0–48.0)
pH, Arterial: 7.362 (ref 7.350–7.450)
pO2, Arterial: 112 mmHg — ABNORMAL HIGH (ref 83.0–108.0)

## 2021-08-25 ENCOUNTER — Encounter: Payer: Self-pay | Admitting: Podiatry

## 2021-08-25 ENCOUNTER — Ambulatory Visit: Payer: 59 | Admitting: Podiatry

## 2021-08-25 ENCOUNTER — Other Ambulatory Visit: Payer: Self-pay

## 2021-08-25 DIAGNOSIS — B351 Tinea unguium: Secondary | ICD-10-CM

## 2021-08-25 DIAGNOSIS — R234 Changes in skin texture: Secondary | ICD-10-CM | POA: Diagnosis not present

## 2021-08-25 DIAGNOSIS — E669 Obesity, unspecified: Secondary | ICD-10-CM

## 2021-08-25 DIAGNOSIS — M79674 Pain in right toe(s): Secondary | ICD-10-CM

## 2021-08-25 DIAGNOSIS — E1149 Type 2 diabetes mellitus with other diabetic neurological complication: Secondary | ICD-10-CM | POA: Diagnosis not present

## 2021-08-25 DIAGNOSIS — N189 Chronic kidney disease, unspecified: Secondary | ICD-10-CM | POA: Diagnosis not present

## 2021-08-25 DIAGNOSIS — M79675 Pain in left toe(s): Secondary | ICD-10-CM

## 2021-08-25 DIAGNOSIS — E1169 Type 2 diabetes mellitus with other specified complication: Secondary | ICD-10-CM

## 2021-08-25 NOTE — Progress Notes (Signed)
This patient returns to my office for at risk foot care.  This patient requires this care by a professional since this patient will be at risk due to having uncontrolled diabetes and kidney transplant.  This patient is unable to cut nails himself since the patient cannot reach his nails.These nails are painful walking and wearing shoes.  Resolved heel fissure.  Ulcer right hallux being treated by Dr.  Amalia Hailey. This patient presents for at risk foot care today.  General Appearance  Alert, conversant and in no acute stress.  Vascular  Dorsalis pedis and posterior tibial  pulses are palpable  bilaterally.  Capillary return is within normal limits  bilaterally. Temperature is within normal limits  bilaterally.  Neurologic  Senn-Weinstein monofilament wire test diminished   bilaterally. Muscle power within normal limits bilaterally.  Nails Thick disfigured discolored nails with subungual debris  from hallux to fifth toes bilaterally. No evidence of bacterial infection or drainage bilaterally.  Orthopedic  No limitations of motion  feet .  No crepitus or effusions noted.  No bony pathology or digital deformities noted. HAV  B/L.  Skin  normotropic skin with no porokeratosis noted bilaterally.  No signs of infections or ulcers noted.   Fissures left heel.  Onychomycosis  Pain in right toes  Pain in left toes  Fissures left heel.  Consent was obtained for treatment procedures.   Mechanical debridement of nails 1-5  bilaterally performed with a nail nipper.  Filed with dremel without incident.     Return office visit  3 months                   Told patient to return for periodic foot care and evaluation due to potential at risk complications.   Gardiner Barefoot DPM

## 2021-09-04 ENCOUNTER — Encounter: Payer: Self-pay | Admitting: Endocrinology

## 2021-09-04 ENCOUNTER — Ambulatory Visit: Payer: 59 | Admitting: Cardiovascular Disease

## 2021-09-05 NOTE — Telephone Encounter (Signed)
I called [employer]) they state that a new Rx has to be sent in for patient to change pods every 48 hours. Informed that patient insurance will not cover since patient is not injecting that much insulin for pods to be changed every 48 hours. Informed that patient is needing more because he states that fall off in the shower and  patient stated that he had reached out to company and they were aware of issue. I spoke with patient by phone to inform him of all this. He will reach back out to [facility].

## 2021-09-10 ENCOUNTER — Other Ambulatory Visit: Payer: Self-pay | Admitting: Cardiovascular Disease

## 2021-09-10 DIAGNOSIS — I350 Nonrheumatic aortic (valve) stenosis: Secondary | ICD-10-CM

## 2021-09-10 DIAGNOSIS — I10 Essential (primary) hypertension: Secondary | ICD-10-CM

## 2021-09-11 ENCOUNTER — Institutional Professional Consult (permissible substitution): Payer: 59 | Admitting: Surgery

## 2021-09-11 ENCOUNTER — Encounter: Payer: Self-pay | Admitting: Surgery

## 2021-09-11 ENCOUNTER — Other Ambulatory Visit: Payer: Self-pay

## 2021-09-11 VITALS — BP 171/80 | HR 98 | Resp 20 | Ht 71.0 in | Wt 295.0 lb

## 2021-09-11 DIAGNOSIS — I350 Nonrheumatic aortic (valve) stenosis: Secondary | ICD-10-CM | POA: Diagnosis not present

## 2021-09-11 DIAGNOSIS — I2510 Atherosclerotic heart disease of native coronary artery without angina pectoris: Secondary | ICD-10-CM | POA: Diagnosis not present

## 2021-09-11 NOTE — Progress Notes (Signed)
Cardiothoracic Surgery Admission History and Physical   PCP is [name], [name], MD Referring Provider is Troy Sine, MD  Chief Complaint  Patient presents with   Coronary Artery Disease    Initial surgical consult, Cath 11/15, ECHO 7/26   Aortic Stenosis    HPI:  The patient is a 60 gentleman with history of hypertension, hyperlipidemia, type 2 diabetes, stage IIIb chronic kidney disease status post nephrectomy at age 60 for congenital nonfunctioning left kidney and subsequent renal transplant in 2018, OSA on CPAP, morbid obesity, PSVT status post ablation in 2018, severe aortic stenosis, and coronary artery disease who was referred for consideration of surgical treatment.  He is here today with his wife.  He reports a several month history of progressive exertional shortness of breath.  He has also been having intermittent upper chest discomfort radiating into his neck and shoulders associated with diaphoresis and nausea.  He denies any dizziness although he did have an episode of vertigo and fell off a ladder.  He did not have any loss of consciousness or head trauma but did injure his right leg and left shoulder.  He had a skin abrasion of his right lower extremity which developed some cellulitis and he was treated with antibiotics.  He denies orthopnea and PND.  He has had some lower extremity edema.  His last echocardiogram on 05/02/2021 showed a trileaflet aortic valve with moderate calcification and thickening with restricted leaflet mobility.  The mean gradient was 44 mmHg with trivial insufficiency.  There was mild enlargement of the ascending aorta at 4.2 cm.  There was moderate to severe mitral annular calcification with mild mitral regurgitation.  Pulmonary pressure was moderately elevated with normal right ventricular function.  Left ventricular ejection fraction was 60 to 65% with moderate LVH and grade 2 diastolic dysfunction.  His previous echocardiogram February 2022  showed a mean gradient of 35 mm.  Cardiac catheterization on 08/22/2021 showed an 80% ostial proximal LAD stenosis followed by a 90% proximal to mid LAD stenosis.  Left circumflex had a large second marginal that 50% stenosis.  The RCA had about 80% mid PDA stenosis which was a moderate size vessel and an 80% lesion in the AV groove portion before a few small posterolateral branches.  Right heart catheterization showed a PA pressure of 41/11 with a mean right atrial pressure of 4.  Mean wedge pressure was 15 2015.  Cardiac index 2.9.  The mean aortic valve gradient was 40 mmHg peak to peak gradient of 42 mmHg.  Aortic valve area was 0.95 cm.  LVEDP was 22.  The patient is married and lives at home with his wife.  He works from home.  He has never smoked cigarettes. Past Medical History:  Diagnosis Date   Anemia    low hgb at present   Arthritis    FEET   Blood transfusion without reported diagnosis 1974   kidney removed - L   Cancer ([place])    skin ca right shoulder, plastic dsyplasia, pre-Ca polpys removed on Colonoscopy- 07/2014   Colon polyps 07/23/2014   Tubular adenomas x 5   Constipation    Diabetes mellitus without complication ([place])    Type 2   Difficult intubation    unsure of actual problem but it was during the January 28, 2015 procedure.   Dysrhythmia    Eczema    ESRD (end stage renal disease) (HCC)    hEMO mwf   Headache(784.0)    slight  dull h/a due kidney failure   Hyperlipidemia    Hypertension    SVT h/o , followed by Dr. Claiborne Billings   Hypothyroidism    Macular degeneration    Neuropathy    right hand and both feet   PAT (paroxysmal atrial tachycardia) (HCC)    PSVT (paroxysmal supraventricular tachycardia) (HCC)    SBO (small bowel obstruction) (HCC)    Shortness of breath    with exertion   Sleep apnea 06/18/11   split-night sleep study- [facility]., BiPAP- 13-16   Thyroid disease    Umbilical hernia    will repair with thi surgery     Past Surgical History:  Procedure Laterality Date   AV FISTULA PLACEMENT Right 02/25/2015   Procedure: RIGHT ARTERIOVENOUS (AV) FISTULA CREATION;  Surgeon: Serafina Mitchell, MD;  Location: [place];  Service: Vascular;  Laterality: Right;   AV FISTULA PLACEMENT Right 09/15/2015   Procedure: ARTERIOVENOUS (AV) FISTULA CREATION- RIGHT ARM;  Surgeon: Serafina Mitchell, MD;  Location: [place];  Service: Vascular;  Laterality: Right;   CAPD INSERTION N/A 05/18/2014   Procedure: LAPAROSCOPIC INSERTION CONTINUOUS AMBULATORY PERITONEAL DIALYSIS  (CAPD) CATHETER;  Surgeon: Ralene Ok, MD;  Location: [place];  Service: General;  Laterality: N/A;   COLONOSCOPY     COLONOSCOPY WITH PROPOFOL N/A 07/12/2016   Procedure: COLONOSCOPY WITH PROPOFOL;  Surgeon: [name], MD;  Location: [facility];  Service: Endoscopy;  Laterality: N/A;   declotting of fistula  08/2015   ESOPHAGOGASTRODUODENOSCOPY (EGD) WITH PROPOFOL N/A 07/12/2016   Procedure: ESOPHAGOGASTRODUODENOSCOPY (EGD) WITH PROPOFOL;  Surgeon: [name], MD;  [facility];  Service: Endoscopy;  Laterality: N/A;   FISTULA SUPERFICIALIZATION Right 02/07/2016   Procedure: RIIGHT UPPER ARM FISTULA SUPERFICIALIZATION;  Surgeon: Elam Dutch, MD;  Location: [place];  Service: Vascular;  Laterality: Right;   IJ catheter insertion     INSERTION OF DIALYSIS CATHETER Right 02/25/2015   Procedure: INSERTION OF RIGHT INTERNAL JUGULAR DIALYSIS CATHETER;  Surgeon: Serafina Mitchell, MD;  Location: [place] [facility];  Service: Vascular;  Laterality: Right;   KIDNEY TRANSPLANT     LAPAROSCOPIC REPOSITIONING CAPD CATHETER N/A 06/16/2014   Procedure: LAPAROSCOPIC REPOSITIONING CAPD CATHETER;  Surgeon: Ralene Ok, MD;  Location: [place];  Service: General;  Laterality: N/A;   LAPAROSCOPY N/A 05/04/2015   Procedure: LAPAROSCOPY DIAGNOSTIC LYSIS OF ADHESIONS;  Surgeon: Coralie Keens, MD;  Location: [place];  Service: General;  Laterality: N/A;   MINOR REMOVAL OF  PERITONEAL DIALYSIS CATHETER N/A 04/28/2015   Procedure:  REMOVAL OF PERITONEAL DIALYSIS CATHETER;  Surgeon: Coralie Keens, MD;  Location: [place];  Service: General;  Laterality: N/A;   NEPHRECTOMY Left 1974   RENAL BIOPSY Right 2012   REVISON OF ARTERIOVENOUS FISTULA Right 05/26/2015   Procedure: SUPERFICIALIZATION OF ARTERIOVENOUS FISTULA WITH SIDE BRANCH LIGATIONS;  Surgeon: Serafina Mitchell, MD;  Location[facility];  Service: Vascular;  Laterality: Right;   RIGHT/LEFT HEART CATH AND CORONARY ANGIOGRAPHY N/A 08/22/2021   Procedure: RIGHT/LEFT HEART CATH AND CORONARY ANGIOGRAPHY;  Surgeon: Troy Sine, MD;  Location: [place] [facility];  Service: Cardiovascular;  Laterality: N/A;   SKIN CANCER EXCISION     right shoulder   SVT ABLATION N/A 11/23/2016   Procedure: SVT Ablation;  Surgeon: Will Meredith Leeds, MD;  Location: [place] [facility];  Service: Cardiovascular;  Laterality: N/A;   UMBILICAL HERNIA REPAIR N/A 05/18/2014   Procedure: HERNIA REPAIR UMBILICAL ADULT;  Surgeon: Ralene Ok,  MD;  Location: [place];  Service: General;  Laterality: N/A;   UMBILICAL HERNIA REPAIR N/A 04/28/2015   Procedure: UMBILICAL HERNIA REPAIR WITH MESH;  Surgeon: Coralie Keens, MD;  Location: [place];  Service: General;  Laterality: N/A;    Family History  Problem Relation Age of Onset   Colon polyps Father    Colon cancer Neg Hx    Stomach cancer Neg Hx     Social History Social History   Tobacco Use   Smoking status: Former    Types: Cigars    Quit date: 12/06/2012    Years since quitting: 8.7   Smokeless tobacco: Never  Substance Use Topics   Alcohol use: Yes    Comment: once/month   Drug use: No    Current Outpatient Medications  Medication Sig Dispense Refill   ACCU-CHEK FASTCLIX LANCETS MISC Use lancets to check blood sugar 5 times daily. 500 each 3   amoxicillin (AMOXIL) 500 MG capsule Take 2,000 mg by mouth See admin instructions. Take 4 capsules (2000 mg) by mouth 1 hour prior to  dental appointments.     aspirin EC 81 MG tablet Take 81 mg by mouth daily.     B-D UF III MINI PEN NEEDLES 31G X 5 MM MISC USE 4 PER DAY TO INJECT INSULIN. 100 each 5   BD INSULIN SYRINGE U/F 31G X 5/16" 1 ML MISC USE 3 TIMES A DAY WITH HUMULIN R 90 each 2   belatacept (NULOJIX) 250 MG SOLR injection Inject 24.5 mLs into the vein every 28 (twenty-eight) days.      Blood Glucose Monitoring Suppl (ACCU-CHEK GUIDE) w/Device KIT 1 each by Does not apply route daily. Use meter to check blood sugar 5 times daily. 1 kit 0   Blood Glucose Monitoring Suppl (FIFTY50 GLUCOSE METER 2.0) w/Device KIT Use as directed     cholecalciferol (VITAMIN D3) 25 MCG (1000 UNIT) tablet Take 1,000 Units by mouth in the morning and at bedtime.     Continuous Blood Gluc Sensor (FREESTYLE LIBRE 3 SENSOR) MISC 1 each by Does not apply route every 14 (fourteen) days. Place 1 sensor on the skin every 14 days. Use to check glucose continuously 6 each 3   DULoxetine (CYMBALTA) 60 MG capsule Take 60 mg by mouth daily.     fluocinonide cream (LIDEX) 4.00 % Apply 1 application topically daily as needed (Skin inflammation (hand, thigh, back)).      furosemide (LASIX) 20 MG tablet Take 20 mg by mouth daily as needed for fluid.     gabapentin (NEURONTIN) 100 MG capsule Take 200-300 mg by mouth See admin instructions. Take 2 capsules (200 mg) by mouth in the morning and take 3 capsules (300 mg) by mouth at bedtime     glucose blood (ACCU-CHEK GUIDE) test strip 1 each by Other route as needed for other. Use as instructed to check blood sugar 5 times daily. 500 each 3   HUMULIN R 500 UNIT/ML injection USE MAX 200 UNITS IN PUMP EVERY THREE DAYS 60 mL 2   insulin aspart (NOVOLOG FLEXPEN) 100 UNIT/ML FlexPen Inject 5-20 Units into the skin 3 (three) times daily before meals.     Insulin Syringe-Needle U-100 (BD INSULIN SYRINGE ULTRAFINE) 31G X 5/16" 0.3 ML MISC Use to inject insulin 2-3 times per day 300 each 1   lactulose (CHRONULAC) 10  GM/15ML solution Take 10-20 g by mouth daily as needed (constipation).     Lancets Misc. (ACCU-CHEK FASTCLIX LANCET) KIT 1  each by Does not apply route daily. 1 kit 0   levothyroxine (SYNTHROID) 100 MCG tablet TAKE 1 TABLET BY MOUTH EVERY DAY 90 tablet 3   lisinopril (ZESTRIL) 20 MG tablet Take 20 mg by mouth 2 (two) times daily.     metoprolol tartrate (LOPRESSOR) 50 MG tablet Take 1 tablet (50 mg total) by mouth 2 (two) times daily. 180 tablet 3   mycophenolate (CELLCEPT) 250 MG capsule Take 1,000 mg by mouth every 12 (twelve) hours.      NEEDLE, DISP, 25 G (B-D DISP NEEDLE 25GX1") 25G X 1" MISC USE TO INJECT TESTOSTERONE (EXCHANGE NEEDLE FROM 22G 3ML SYRINGE) 200 each 2   predniSONE (DELTASONE) 5 MG tablet Take 5 mg by mouth daily with breakfast.     rosuvastatin (CRESTOR) 20 MG tablet Take 20 mg by mouth at bedtime.     senna-docusate (SENOKOT-S) 8.6-50 MG tablet Take 1 tablet by mouth as needed for mild constipation.     Syringe, Disposable, (SYRINGE LUER LOCK) 3 ML MISC Use 1 syringe once weekly to inject Testosterone medication. 100 each 1   tamsulosin (FLOMAX) 0.4 MG CAPS capsule Take 0.8 mg by mouth at bedtime.      testosterone cypionate (DEPOTESTOSTERONE CYPIONATE) 200 MG/ML injection Inject 0.6 mLs (120 mg total) into the muscle every 7 (seven) days. 10 mL 1   traMADol (ULTRAM) 50 MG tablet Take 1 tablet (50 mg total) by mouth every 6 (six) hours as needed. (Patient taking differently: Take 150 mg by mouth in the morning and at bedtime.) 15 tablet 0   traZODone (DESYREL) 50 MG tablet Take 50-100 mg by mouth at bedtime as needed for sleep.      UNABLE TO FIND BiPAP: At bedtime     zolpidem (AMBIEN) 10 MG tablet Take 10 mg by mouth at bedtime as needed for sleep.     amLODipine (NORVASC) 10 MG tablet TAKE 1 TABLET BY MOUTH EVERY DAY 90 tablet 1   No current facility-administered medications for this visit.    No Known Allergies  Review of Systems  Constitutional:  Positive for  activity change, diaphoresis and fatigue. Negative for appetite change, chills and fever.  HENT:  Negative for dental problem.        Sees his dentist regularly  Eyes: Negative.   Respiratory:  Positive for apnea and shortness of breath.   Cardiovascular:  Positive for leg swelling. Negative for chest pain and palpitations.  Endocrine: Negative.   Genitourinary:        Kidney transplant  Musculoskeletal: Negative.   Skin: Negative.   Allergic/Immunologic: Negative.   Neurological:  Positive for numbness. Negative for dizziness and syncope.       In hands and feet  Hematological: Negative.   Psychiatric/Behavioral:         Depression and anxiety   BP (!) 171/80 (BP Location: Left Arm, Patient Position: Sitting)   Pulse 98   Resp 20   Ht _0  (1.803 m)   Wt 295 lb (133.8 kg)   SpO2 94% Comment: RA  BMI 41.14 kg/m  Physical Exam Constitutional:      Appearance: Normal appearance. He is obese.  HENT:     Head: Normocephalic and atraumatic.  Eyes:     Extraocular Movements: Extraocular movements intact.     Conjunctiva/sclera: Conjunctivae normal.     Pupils: Pupils are equal, round, and reactive to light.  Neck:     Comments: Transmitted murmur or bruit both sides of the  neck Cardiovascular:     Rate and Rhythm: Normal rate and regular rhythm.     Heart sounds: Murmur heard.     Comments: 3/6 systolic murmur loudest along the right sternal border Pulmonary:     Effort: Pulmonary effort is normal.     Breath sounds: Normal breath sounds.  Abdominal:     Tenderness: There is no abdominal tenderness.  Musculoskeletal:        General: No swelling or tenderness.     Cervical back: Normal range of motion and neck supple.  Skin:    General: Skin is warm and dry.  Neurological:     General: No focal deficit present.     Mental Status: He is alert and oriented to person, place, and time.  Psychiatric:        Mood and Affect: Mood normal.        Behavior: Behavior normal.      Diagnostic Tests:      ECHOCARDIOGRAM REPORT         Patient Name:   Robert [name] Date of Exam: 05/02/2021  Medical Rec #:  [id number]        Height:       71.0 in  Accession #:    [id number]       Weight:       299.6 lb  Date of Birth:  September 12, 1961        BSA:          2.504 m  Patient Age:    24 years         BP:           148/94 mmHg  Patient Gender: M                HR:           73 bpm.  Exam Location:  [place]   Procedure: 2D Echo, Cardiac Doppler and Color Doppler   Indications:    I35.0 Aortic Stenosis     History:        Patient has prior history of Echocardiogram examinations,  most                  recent 11/16/2020. Risk Factors:Diabetes, Hypertension and                  Dyslipidemia. Sleep apnea. Hypothyroidism. PSVT. Shortness  of                  breath. End stage renal disease.     Sonographer:    Wilford Sports Rodgers-Jones RDCS  Referring Phys: [place]     1. Left ventricular ejection fraction, by estimation, is 60 to 65%. The  left ventricle has normal function. The left ventricle has no regional  wall motion abnormalities. There is moderate left ventricular hypertrophy.  Left ventricular diastolic  parameters are consistent with Grade II diastolic dysfunction  (pseudonormalization). Elevated left ventricular end-diastolic pressure.   2. Right ventricular systolic function is normal. The right ventricular  size is normal. There is moderately elevated pulmonary artery systolic  pressure.   3. Left atrial size was moderately dilated.   4. The mitral valve is abnormal. Mild mitral valve regurgitation.  Moderate to severe mitral annular calcification.   5. The aortic valve is tricuspid. There is moderate calcification of the  aortic valve. Aortic valve regurgitation is trivial. Moderate to severe  aortic valve stenosis. Aortic valve area, by  VTI measures 1.09 cm. Aortic  valve mean gradient measures 44.4   mmHg. Aortic  valve Vmax measures 3.99 m/s. DI is 0.29 - based on LVOT  diameter of 2.2 cm.   6. Aortic dilatation noted. There is mild dilatation of the ascending  aorta, measuring 42 mm.   7. The inferior vena cava is dilated in size with >50% respiratory  variability, suggesting right atrial pressure of 8 mmHg.   Comparison(s): Changes from prior study are noted. 11/16/2020: LVEF 65-70%,  moderate AS -mean gradient 34.6 mmHg.   FINDINGS   Left Ventricle: Left ventricular ejection fraction, by estimation, is 60  to 65%. The left ventricle has normal function. The left ventricle has no  regional wall motion abnormalities. The left ventricular internal cavity  size was normal in size. There is   moderate left ventricular hypertrophy. Left ventricular diastolic  parameters are consistent with Grade II diastolic dysfunction  (pseudonormalization). Elevated left ventricular end-diastolic pressure.   Right Ventricle: The right ventricular size is normal. No increase in  right ventricular wall thickness. Right ventricular systolic function is  normal. There is moderately elevated pulmonary artery systolic pressure.  The tricuspid regurgitant velocity is  3.06 m/s, and with an assumed right atrial pressure of 8 mmHg, the  estimated right ventricular systolic pressure is 32.9 mmHg.   Left Atrium: Left atrial size was moderately dilated.   Right Atrium: Right atrial size was normal in size.   Pericardium: There is no evidence of pericardial effusion.   Mitral Valve: The mitral valve is abnormal. There is mild thickening of  the mitral valve leaflet(s). There is mild calcification of the posterior  mitral valve leaflet(s). Moderate to severe mitral annular calcification.  Mild mitral valve regurgitation.   Tricuspid Valve: The tricuspid valve is grossly normal. Tricuspid valve  regurgitation is trivial.   Aortic Valve: The aortic valve is tricuspid. There is moderate  calcification of the aortic  valve. Aortic valve regurgitation is trivial.  Moderate to severe aortic stenosis is present. Aortic valve mean gradient  measures 44.4 mmHg. Aortic valve peak  gradient measures 63.6 mmHg. Aortic valve area, by VTI measures 0.81 cm.   Pulmonic Valve: The pulmonic valve was normal in structure. Pulmonic valve  regurgitation is not visualized.   Aorta: Aortic dilatation noted. There is mild dilatation of the ascending  aorta, measuring 42 mm.   Venous: The inferior vena cava is dilated in size with greater than 50%  respiratory variability, suggesting right atrial pressure of 8 mmHg.   IAS/Shunts: No atrial level shunt detected by color flow Doppler.      LEFT VENTRICLE  PLAX 2D  LVIDd:         5.60 cm  Diastology  LVIDs:         3.90 cm  LV e' medial:    6.96 cm/s  LV PW:         1.30 cm  LV E/e' medial:  18.2  LV IVS:        1.30 cm  LV e' lateral:   11.40 cm/s  LVOT diam:     1.90 cm  LV E/e' lateral: 11.1  LV SV:         74  LV SV Index:   30  LVOT Area:     2.84 cm      RIGHT VENTRICLE             IVC  RV Basal diam:  5.40 cm  IVC diam: 2.30 cm  RV S prime:     16.40 cm/s  TAPSE (M-mode): 2.7 cm   LEFT ATRIUM             Index       RIGHT ATRIUM           Index  LA diam:        6.30 cm 2.52 cm/m  RA Area:     20.80 cm  LA Vol (A2C):   99.4 ml 39.70 ml/m RA Volume:   63.20 ml  25.24 ml/m  LA Vol (A4C):   97.8 ml 39.06 ml/m  LA Biplane Vol: 98.5 ml 39.34 ml/m   AORTIC VALVE  AV Area (Vmax):    0.85 cm  AV Area (Vmean):   0.75 cm  AV Area (VTI):     0.81 cm  AV Vmax:           398.80 cm/s  AV Vmean:          315.600 cm/s  AV VTI:            0.918 m  AV Peak Grad:      63.6 mmHg  AV Mean Grad:      44.4 mmHg  LVOT Vmax:         120.00 cm/s  LVOT Vmean:        83.850 cm/s  LVOT VTI:          0.262 m  LVOT/AV VTI ratio: 0.29     AORTA  Ao Root diam: 3.70 cm  Ao Asc diam:  4.20 cm   MITRAL VALVE                TRICUSPID VALVE  MV Area (PHT): 4.60  cm     TR Peak grad:   37.5 mmHg  MV Decel Time: 165 msec     TR Vmax:        306.00 cm/s  MV E velocity: 127.00 cm/s  MV A velocity: 73.40 cm/s   SHUNTS  MV E/A ratio:  1.73         Systemic VTI:  0.26 m                              Systemic Diam: 1.90 cm   Lyman Bishop MD  Electronically signed by Lyman Bishop MD  Signature Date/Time: 05/02/2021/3:22:28 PM         Final     Physicians  Panel Physicians Referring Physician Case Authorizing Physician  Troy Sine, MD (Primary)     Procedures  RIGHT/LEFT HEART CATH AND CORONARY ANGIOGRAPHY   Conclusion      2nd Mrg lesion is 50% stenosed.   Lat 2nd Mrg lesion is 20% stenosed.   RPAV lesion is 80% stenosed.   Dist LM to Prox LAD lesion is 80% stenosed.   Prox LAD to Mid LAD lesion is 90% stenosed.   LV end diastolic pressure is mildly elevated.       Recommendations  Antiplatelet/Anticoag Recommend Aspirin 75m daily for moderate CAD.   Indications  Aortic valve stenosis, etiology of cardiac valve disease unspecified [I35.0 (ICD-10-CM)]   Procedural Details  Technical Details Mr. AYannis Broceis a 60year old gentleman who has a history of hypertension, hyperlipidemia, diabetes mellitus,  s/p nephrectomy at age [age]for congenital nonfunctioning left kidney and subsequent end-stage renal disease previously on dialysis with subsequent renal transplant 2018.over the years  he has developed progressive aortic valve stenosis and his most recent echocardiogram in 04/2021 showed EF 60-65%, no R WMA, moderate LVH, G2 DD, moderate LAE, mild MR with moderate to severe MAC, moderate to severe aortic stenosis with mean gradient 44.4 mmHg and peak gradient 63.6 mmHg, and mild dilation of the ascending aorta (42 mm).  The patient has been followed at [facility] his renal transplant with recent creatinines elevated from 1.69-2.2. He has developed increasing exertional dyspnea.  His most recent creatinine improved to 1.85.  Due to  worsening exertional symptomatology he is now referred for right and left heart cardiac catheterization.  Robert [name] presented to the Cath Lab in the fasting state.  His right femoral region was prepped and draped in sterile fashion.  His right femoral artery was punctured anteriorly and a 5 French sheath was inserted.  His right femoral vein was punctured anteriorly and a 7 French sheath was inserted.  A 7 French Swan-Ganz catheter was advanced through the femoral sheath to the RA, RV, PA, pulmonary capillary wedge positions for hemodynamic monitoring.  Oxygen saturation was obtained x2 in the pulmonary artery.  Diagnostic cardiac catheterization was done with 5 French Judkins 4 left and right catheters.  Oxygen saturation was obtained in the central aorta.  Ultimately the aortic valve was successfully crossed with an AL-1 catheter and straight wire for left ventricular pressure recording.  Left ventriculography was not done. A careful LV to AO pullback was performed.  All catheters were removed from the patient.  Hemostasis was obtained by direct manual pressure.  The patient tolerated the procedure well and will return to short stay with plans for discharge later today. Estimated blood loss <50 mL.   During this procedure medications were administered to achieve and maintain moderate conscious sedation while the patient's heart rate, blood pressure, and oxygen saturation were continuously monitored and I was present face-to-face 100% of this time.   Medications (Filter: Administrations occurring from 1354 to 1526 on 08/22/21) fentaNYL (SUBLIMAZE) injection (mcg) Total dose:  75 mcg Date/Time Rate/Dose/Volume Action   08/22/21 1420 25 mcg Given   1426 25 mcg Given   1441 25 mcg Given    midazolam (VERSED) injection (mg) Total dose:  3 mg Date/Time Rate/Dose/Volume Action   08/22/21 1421 2 mg Given   1426 1 mg Given    lidocaine (PF) (XYLOCAINE) 1 % injection (mL) Total volume:  15  mL Date/Time Rate/Dose/Volume Action   08/22/21 1425 15 mL Given    iohexol (OMNIPAQUE) 350 MG/ML injection (mL) Total volume:  80 mL Date/Time Rate/Dose/Volume Action   08/22/21 1525 80 mL Given    Heparin (Porcine) in NaCl 1000-0.9 UT/500ML-% SOLN (mL) Total volume:  1,000 mL Date/Time Rate/Dose/Volume Action   08/22/21 1524 500 mL Given   1524 500 mL Given    Sedation Time  Sedation Time Physician-1:  Radiation/Fluoro  Fluoro time: 13.6 (min) DAP: 70026 (mGycm2) Cumulative Air Kerma: 873 (mGy) Coronary Findings  Diagnostic Dominance: Right Left Main  Dist LM to Prox LAD lesion is 80% stenosed.    Left Anterior Descending  Prox LAD to Mid LAD lesion is 90% stenosed.    Left Circumflex    First Obtuse Marginal Branch  Vessel is small in size.    Second Obtuse Marginal Branch  2nd Mrg lesion is 50% stenosed.    Lateral Second Obtuse Marginal Branch  Lat 2nd Mrg lesion is 20% stenosed.    Right Coronary Artery    Right Posterior  Atrioventricular Artery  RPAV lesion is 80% stenosed.    Intervention   No interventions have been documented.   Right Heart  Right Atrium RA: A-wave 7, V wave 4; mean 4 RV: 41/6 PA: 41/11 PW: A-wave 17, V wave 15; mean 15.  Initial AO: 114/60          LV: 161/22  Pullback: LV: 162/23 AO: 120/62  By the Fick method, cardiac output 7.3 L/min with cardiac index 2.9 L/min/m.  PVR: 1.8WU  Mean aortic gradient 40 mmHg Peak to peak aortic gradient 42 mmHg AVA: 0.95 cm2   Left Heart  Left Ventricle LV end diastolic pressure is mildly elevated.   Coronary Diagrams  Diagnostic Dominance: Right Intervention  Implants     No implant documentation for this case.   [employer] Images   Show images for CARDIAC CATHETERIZATION Images on [employer] Term Storage   Show images for [place], [place] to Procedure Log  Procedure Log    Hemo Data  Flowsheet Row Most Recent Value  Fick Cardiac Output 7.26 L/min  Fick  Cardiac Output Index 2.94 (L/min)/BSA  Aortic Mean Gradient 40.1 mmHg  Aortic Peak Gradient 42 mmHg  Aortic Valve Area 0.95  Aortic Value Area Index 0.39 cm2/BSA  RA A Wave 7 mmHg  RA V Wave 4 mmHg  RA Mean 4 mmHg  RV Systolic Pressure 41 mmHg  RV Diastolic Pressure 0 mmHg  RV EDP 6 mmHg  PA Systolic Pressure 41 mmHg  PA Diastolic Pressure 11 mmHg  PA Mean 28 mmHg  PW A Wave 17 mmHg  PW V Wave 15 mmHg  PW Mean 15 mmHg  AO Systolic Pressure 295 mmHg  AO Diastolic Pressure 60 mmHg  AO Mean 80 mmHg  LV Systolic Pressure 621 mmHg  LV Diastolic Pressure -7 mmHg  LV EDP 22 mmHg  AOp Systolic Pressure 308 mmHg  AOp Diastolic Pressure 64 mmHg  AOp Mean Pressure 88 mmHg  LVp Systolic Pressure 657 mmHg  LVp Diastolic Pressure 0 mmHg  LVp EDP Pressure 23 mmHg  QP/QS 1  TPVR Index 9.51 HRUI  TSVR Index 27.17 HRUI  PVR SVR Ratio 0.17  TPVR/TSVR Ratio 0.35     Impression:  Is 60 year old gentleman has stage D, severe, symptomatic aortic stenosis with New York Heart Association class III symptoms due to shortness of breath consistent with chronic diastolic congestive heart failure.  His cardiac catheterization shows severe three-vessel coronary disease 80% of the 90% proximal LAD stenosis, 50% marginal stenosis, and 80% PDA and PL AV groove stenoses.  He has had some exertional discomfort of the chest radiating to the neck and shoulders on both sides.  I agree that aortic valve replacement using a bioprosthetic valve and coronary artery bypass graft surgery is going to be the best treatment to this patient.  His coronary disease is not ideal for PCI especially with calcified ostial and proximal LAD stenoses.  He is also relatively young for a transcatheter aortic valve replacement.  I think his symptoms are likely related to the combination of severe aortic stenosis and significant coronary disease and both problems that are to be treated.  His operative risk is certainly increased due to  his morbid obesity and multiple comorbidities including poorly controlled diabetes and stage IIIb chronic kidney disease status post renal transplantation.he certainly could have worsening of his kidney function.  I discussed the operative procedure with the patient and his wife including alternatives, benefits and risks; including but not limited to  bleeding, blood transfusion, infection, stroke, myocardial infarction, graft failure, heart block requiring a permanent pacemaker, organ dysfunction, and death.  Robert [name] understands and agrees to proceed.     Plan:  He will call us back and schedule aortic valve replacement using a bioprosthetic valve and coronary bypass graft surgery.  I spent 60 minutes performing this consultation and > 50% of this time was spent face to face counseling and coordinating the care of this patient's severe symptomatic aortic stenosis and multivessel coronary disease.   [name], MD [facility] [phone]

## 2021-09-13 ENCOUNTER — Encounter: Payer: Self-pay | Admitting: Endocrinology

## 2021-09-13 ENCOUNTER — Encounter: Payer: 59 | Admitting: Surgery

## 2021-09-14 ENCOUNTER — Encounter: Payer: 59 | Admitting: Surgery

## 2021-09-18 ENCOUNTER — Other Ambulatory Visit: Payer: Self-pay

## 2021-09-18 ENCOUNTER — Ambulatory Visit (INDEPENDENT_AMBULATORY_CARE_PROVIDER_SITE_OTHER): Payer: 59 | Admitting: Cardiovascular Disease

## 2021-09-18 ENCOUNTER — Encounter: Payer: Self-pay | Admitting: Cardiovascular Disease

## 2021-09-18 VITALS — BP 140/78 | HR 81 | Ht 71.0 in | Wt 295.4 lb

## 2021-09-18 DIAGNOSIS — G4733 Obstructive sleep apnea (adult) (pediatric): Secondary | ICD-10-CM

## 2021-09-18 DIAGNOSIS — Z940 Kidney transplant status: Secondary | ICD-10-CM

## 2021-09-18 DIAGNOSIS — I350 Nonrheumatic aortic (valve) stenosis: Secondary | ICD-10-CM

## 2021-09-18 DIAGNOSIS — Z794 Long term (current) use of insulin: Secondary | ICD-10-CM

## 2021-09-18 DIAGNOSIS — E785 Hyperlipidemia, unspecified: Secondary | ICD-10-CM

## 2021-09-18 DIAGNOSIS — Z01810 Encounter for preprocedural cardiovascular examination: Secondary | ICD-10-CM

## 2021-09-18 DIAGNOSIS — E039 Hypothyroidism, unspecified: Secondary | ICD-10-CM

## 2021-09-18 DIAGNOSIS — N1832 Chronic kidney disease, stage 3b: Secondary | ICD-10-CM

## 2021-09-18 DIAGNOSIS — I25119 Atherosclerotic heart disease of native coronary artery with unspecified angina pectoris: Secondary | ICD-10-CM | POA: Diagnosis not present

## 2021-09-18 DIAGNOSIS — Z9989 Dependence on other enabling machines and devices: Secondary | ICD-10-CM | POA: Diagnosis not present

## 2021-09-18 DIAGNOSIS — E118 Type 2 diabetes mellitus with unspecified complications: Secondary | ICD-10-CM

## 2021-09-18 NOTE — Progress Notes (Signed)
Patient ID: Robert [name], male   DOB: 1961-04-11, 60 y.o.   MRN: [id number]     HPI: Robert [name], is a 60 y.o. male who is a former patient of Dr. Terance Ice.  He presents for follow-up evaluation following his recent right and left heart cardiac catheterization and surgical evaluation for aortic stenosis and CAD.  Robert [name]  has a complex medical history that includes a congenital nonfunctioning left kidney for which he underwent left nephrectomy at age 36. He has a history of obesity, diabetes mellitus, and has been demonstrated to have proteinuria of his remaining single kidney, being status post open renal biopsy in [name] in November 2010. This revealed focal segmental glomerular sclerosis. At that time he was seen by Dr. Venita Sheffield in the nephrology department at [facility]. He sees Dr. Moshe Cipro in [place] for his kidney insufficiency and has been on ARB therapy in the past in an attempt to reduce proteinuria. He sees Dr. Dwyane Dee for his diabetes mellitus. He also has a history of hypothyroidism, hyperlipidemia, anxiety and depression. He has a history of prior supraventricular tachycardia requiring hospitalization in 2007 as was felt to be possibly AV nodal reentrant rhythm.  He has a history of complex sleep apnea initially diagnosed in 2007 with an AHI at that time of 56.7 per hour and during REM sleep this increased to 65.7 per hour. At that time he dropped his oxygen 84% and had loud snoring. He had been on BiPAP therapy. He underwent a 5 year followup split-night study in 2012 which again confirmed severe sleep apnea with an AHI of 60.7 on the baseline portion of the study. He was unable to achieve REM sleep at that time. Oxygen saturation during non-REM sleep at 83%. He was titrated utilizing BiPAP up to a setting of 16/12 cm water pressure. He does admit to continued residual daytime sleepiness despite using his BiPAP therapy.  An  echo Doppler study in August 2012 showed mild concentric LVH with normal systolic function and grade 1 diastolic dysfunction. He had trace mitral and tricuspid insufficiency as well as pulmonic insufficiency. A nuclear perfusion study in August 2012 showed normal perfusion.  Mr. Gatta was on dialysis Monday, [name].  He is on the [place] transplant list.  In January 2017 he had an stress echo at [facility] as part of his transplant evaluation.  I do not have the results of this evaluation.  He has had some difficulty with a steal syndrome in his right hand since he had AV fistulas inserted in the forearm and most recently now in the upper arm.  He continues to use BiPAP with 100% compliance.  He had a [name].  He obtains his CPAP supplies online because they are cheaper, but had seen [place] patient.  He is using a Art therapist full face mask.  A download in the office from 05/28/2016 through 07/26/2016.  He is meeting compliance standards with usage at 85% usage stays greater than 4 hours at 77%.  He is averaging 6 hours and 43 minutes per night.  He is set at an IPAP pressure of 16 and EPAP pressure of 12.  His AHI was mildly increased at 6.5 with 5.2 per hour of apneic events and 1.3 of hypopnea events.  When I last saw him he had stopped taken Bystolic since his blood pressure had become low at the end of dialysis.  He had been on 5  mg on nondialysis days.  He tells me his aspirin was discontinued because he was continuously having issues with bleeding following his dialysis when the catheter was removed.   He had an episode of SVT in December 2017, which was successfully aborted with vagal maneuvers.  In the emergency room, he had 2-3 more recurrences of SVT, of which, again, all aborted with vagal maneuvers.  He was started on verapamil and discharge from the hospital.  He has seen Dr. Curt Bears and had an EP study with attempted ablation, but apparently he was not able to  be induced and ablation was not performed.   He denies any recurrent episodes of SVT.  Remotely, he may of had some mild nonexertional musculoskeletal discomfort but has not experienced any of this recently.  Reportedly, his remote stress echo was negative for ischemia, although mild ST changes were noted in recovery.  He continues to be asymptomatic.  He underwent a kidney transplant from a living donor on 02/25/2017 at [facility].  He tolerated surgery well but has developed a postoperative inguinal hernia, which will ultimately need to be surgically repaired.  He received a new BiPAP machine from [facility] 2 weeks ago.  He has a full face mask, but admits to increased leak.    Since I saw him in September 2018, he had 2 hospital stays at [facility].  He developed intestinal bleeding due to diverticular disease and also had some dehydration issues.  He also underwent inguinal hernia surgery.  He denies any recent chest pain or palpitations.  He continues to use BiPAP with 100% compliance.  However, he did not have his machine with him for the days he was in the hospital.  He brought his machine with him today for interrogation.  Over the past month, usage hours and 6 hours and 32 minutes per night with an HI of 0.9.  He used to machine 27 of 30 nights nights that were not used was because he was in the hospital.  Usage greater than 4 hours was 26 of 30.  He is on a BiPAP pressure of 18/13 0.9.  Recently he has noticed a mild leak.  AHI was 5.6 over the past 7 days.  He has a ResMed fullface mask, Mirage Quatro mask.    When I saw him in November 2018, he was feeling well. He is seeing Dr. Azzie Glatter at [facility] from a nephrology standpoint.  He denies any episodes of chest pain or awareness of recurrent arrhythmia.  He admits to 100% use with CPAP.  [name] is his DME company.  He has a ResMed air 10 V auto unit with an EPAP pressure 14.  An IPAP pressure set at 18.  A download from 01/14/2017 through  02/12/2018 shows 100% compliance.  He is averaging 8 hours and 7 minutes of sleep.  AHI is excellent at 2.6.    I saw him in May 2019 and since that time, he has continued to follow at [facility]. He was last evaluated by me in a telemedicine visit in April 2020.  At that time he had noticed that over the past year  his blood pressure had increased and his lisinopril dose was increased to 20 mg and amlodipine reduced to 5 mg.  His weight had fluctuated and has increased from 293 to 303.  He has continued to use BiPAP with 100% compliance.  I was able to obtain a download today from March 23 through January 27, 2019.  He  is 100% compliant with average BiPAP use of 7 hours and 10 minutes.  At his 18/14 pressure, AHI remains excellent at 2.6/h.  Over the past 6 months he has changed jobs twice.  He is been staying home particularly with his significant comorbidities which can be affected by the coronavirus including hypertension, diabetes mellitus, obesity, renal disease, and immunosuppression.  He walks approximately 2 times a day for short duration.  He receives immunosuppressant infusion with Nulojix and is scheduled for his next infusion: On Feb 12, 2019.  His last lipid panel was in October 2019 with total cholesterol 137 and LDL cholesterol 73.  He is scheduled to see his nephrologist in August.    I saw him in the office in February 2021 and since his  telemedicine evaluation in April 2020, he continued to be followed closely at [facility]  Creatinine had increased to 1.74.  He has noticed blood pressure elevation.  He has not been successful with weight loss.  He has been without anginal symptoms or awareness of recurrent SVT.  He has continued to use BiPAP therapy.  A download from 10/22/2019 through 11/20/1999 shows 93% of usage days.  Average use is 8 hours and 10 minutes.  At his pressure of 18/14, AHI is excellent at 1.5.  There is no mask leak.  During his February 2021 evaluation he had a 2/6 murmur consistent  with aortic stenosis and I recommended follow-up echo Doppler evaluation.   He underwent an echo Doppler study on December 08, 2019 which showed an EF of 65 to 70%, moderate left ventricular hypertrophy and grade 1 diastolic dysfunction.  There was mild mitral valve regurgitation.  There was moderate to severe aortic stenosis with a mean gradient of 39 peak gradient of 58.   He was evaluated in a telemedicine visit in June 2021.  At that time he denied any chest pain, presyncope or syncope.  He continues to go to [facility] for nephrology follow-up.  He states his blood pressure is stable every morning upon awakening but as a day progresses it may go up slightly.  He denies any exertional shortness of breath.  At times he has noticed some mild lower extremity edema.  He was evaluated at [facility] in July 2021 and was hospitalized overnight with right lower quadrant pain around his renal allograft.  A CT scan demonstrated normal-appearing transplant kidney without any acute abdominal/pelvic abnormalities however, significant stool burden was noted on CT suggesting moderate to severe constipation.  I last saw him in December 2021 at which time he denied any chest pain but had experienced some mild shortness of breath particularly with walking up steps.Marland Kitchen He is now on insulin pump.  He has noticed some lower extremity edema.  He continued to use BiPAP.  A download was obtained in the office today from November 7 through September 12, 2020 which shows excellent compliance.  There were only 2 days of nonuse when he never went to bed and it stayed in a recliner.  He is on BiPAP with a pressure of 18/14.  He is averaging 8 hours and 54 minutes of BiPAP use per night.  AHI is excellent at 2.0.  He does have mask leak and has been noticing a leak in the region of his chin.    I recommended he have a follow-up echo Doppler study which was done on November 16, 2020.  This showed continued hyperdynamic LV function with EF at 65 to 70%  and moderate LVH with  grade 2 diastolic dysfunction.  RV size was normal.  Moderately elevated pulmonary artery systolic pressure at 45 mm.  There was mild mitral stenosis with a mean valve gradient of 3.2 mm with moderately severe mitral annular calcification.  There was moderate calcification of his trileaflet aortic valve.  He was felt to have moderate aortic stenosis with a mean gradient of 34.6 and peak gradient of 57 with calculated valve area 1.05 cm.  I saw him on December 01, 2020 at which time he continued to feel well but was experiencing some some leg edema and  shortness of breath with activity.  He is followed closely at [facility] for his renal transplant.  Most recent creatinine on October 27, 2020 was further increased at 1.89.  Hemoglobin A1c was 6.9. He denies palpitations.  He continues to use BiPAP.  I obtained a new download in the office today from January 25 through November 30, 2020.  Compliance is excellent with average use at 10 hours and 16 minutes per night.  At his BiPAP pressure of 18/14, AHI is 2.3.  There are nights with significant mask leak.  During that evaluation, I recommended that he undergo a follow-up echo Doppler study for reassessment of his aortic valve which may be progressing and it stenosis.  When I saw him on August 9,2022 he had recently noticed some slight increase shortness of breath with exertion.  He denied any presyncope or syncope.  However at times when his blood pressure gets too low he does note transient dizziness.  He admitted to swelling in his lower extremities right leg greater than left and has taken as needed furosemide but has not taken this recently.  He wass scheduled to undergo a renal transplant reevaluation in October at [facility].  He denied any chest pain.  He denies any palpitations.  He underwent an echo Doppler study on May 02, 2021.  EF remained stable at 60 to 65%.  There was moderate left ventricular hypertrophy and there was grade 2 diastolic  dysfunction.  There was moderate left atrial dilatation.  There was moderate calcification of a trileaflet aortic valve with moderately severe aortic stenosis.  The mean gradient had now increased to 44.4 and the peak instantaneous gradient to 64 mmHg.  The aortic valve area (VTI) was 0.81 cm.  There was mild dilation of the ascending aorta at 42 mm.  He continues to use BiPAP with 100% compliance.  A new download was obtained from July 10 through May 15, 2021 and average use is 8 hours and 15 minutes per night.  His BiPAP settings are 18/14 and AHI is excellent at 1.6/h.  During that evaluation, I discussed with him that with his progressive aortic valve stenosis he may ultimately require definitive cardiac catheterization and will need extensive CT imaging for potential candidacy for TAVR.  At that time I discussed with him the importance of being seen by his [facility] transplant team.  I also suggested he wear support stockings at least 20 to 30 mg and as needed to take furosemide 20 mg possibly daily or every other 2 to 3 days but depending upon his leg swelling.  I last saw him on July 11, 2021 and since his prior evaluation he had contacted [facility] verbally since I saw him, he had apparently contacted [facility] verbally and was told that if catheterization was necessary he would need to undergo go adequate hydration.  Apparently, he subsequently developed a cough on September 24 through 25 that was  productive with some purulent sputum.  He was seen by Dr. Wylene Simmer.  A chest x-ray reportedly suggested bilateral pneumonia.  He tested negative for COVID and flu.  He was empirically started on cefdinir yesterday for a 10-day course of antibiotics.  He underwent follow-up laboratory on July 04, 2021.  His creatinine had risen to 1.93 on July 04, 2021 which had increased from 1.77 on March 23, 2021 and from 1.69 on December 13, 2020.  During that evaluation, I stressed to him that I wanted to be seen in person at [facility]  rather than a virtual visit.  He underwent follow-up laboratory at [facility] on July 18, 2021 and his creatinine now had increased to 2.2 with estimated GFR of 33 consistent with stage IIIb CKD, gradually progressing towards stage IV.  He was evaluated at the time by Veronda Prude, PA and his October 11 creatinine was not yet available  and it was felt that his creatinine was stable at 1.7.   Presently, Mr. Kohan denies any recent chest tightness or pressure.  He does have fatigability and low stamina.  He is to undergo a monthly antirejection infusion with Nulojix at the [facility] infusion center and for that he will have a follow-up bmet.  I have also asked that he personally contact his [facility] physician following this regarding his progressive increase in creatinine and potential risk associated with future catheterization if necessary.  He continues to use BiPAP and a download was obtained in the office  from September 25 through July 31, 2021.  Compliance is excellent with average use at 8 hours and 38 minutes.  At his 18/14 centimeters water pressure AHI is excellent at 1.8.  With stabilization of his renal function, I performed right and left heart catheterization on August 22, 2021.  He was found to have 80% ostial proximal LAD stenosis followed by 90% proximal to mid LAD stenosis.  The circumflex vessel had a large second marginal vessel with 50% stenosis in the RCA had 80% mid PDA stenosis with 80% lesion in the AV groove portion before few small posterolateral branches.  Right heart catheterization revealed PA pressure 41/11 with a mean right atrial pressure 4.  Mean aortic gradient was 40 mm with a peak to peak gradient at 42 mm and aortic valve area of 0.95 cm.  Following his cardiac catheterization, he was seen by Judy Pimple, PA in the office.  I scheduled him for surgical consultation with Dr. Lenord Carbo who saw him on September 11, 2021.  He was felt to have stage D, severe,  symptomatic aortic stenosis with New York Heart Association class III symptoms with shortness of breath consistent with chronic diastolic congestive heart failure.  With his severe three-vessel CAD and aortic stenosis he concurred that aortic valve replacement using a bioprosthetic valve and coronary artery bypass graft surgery will be his best treatment options.  He felt the symptoms are most likely related to the combination of severe aortic stenosis and significant CAD.  He was given tentative dates to schedule surgery after Christmas in late December and in early January.  Presently he admits to increasing shortness of breath.  He denies recent chest pain, presyncope or syncope.  He presents for reevaluation.   Past Medical History:  Diagnosis Date   Anemia    low hgb at present   Arthritis    FEET   Blood transfusion without reported diagnosis 1974   kidney removed - L   Cancer (HCC)  skin ca right shoulder, plastic dsyplasia, pre-Ca polpys removed on Colonoscopy- 07/2014   Colon polyps 07/23/2014   Tubular adenomas x 5   Constipation    Diabetes mellitus without complication ([place])    Type 2   Difficult intubation    unsure of actual problem but it was during the January 28, 2015 procedure.   Dysrhythmia    Eczema    ESRD (end stage renal disease) (HCC)    hEMO mwf   Headache(784.0)    slight dull h/a due kidney failure   Hyperlipidemia    Hypertension    SVT h/o , followed by Dr. Claiborne Billings   Hypothyroidism    Macular degeneration    Neuropathy    right hand and both feet   PAT (paroxysmal atrial tachycardia) (HCC)    PSVT (paroxysmal supraventricular tachycardia) (HCC)    SBO (small bowel obstruction) (HCC)    Shortness of breath    with exertion   Sleep apnea 06/18/11   split-night sleep study- [facility]., BiPAP- 13-16   Thyroid disease    Umbilical hernia    will repair with thi surgery    Past Surgical History:  Procedure Laterality Date    AV FISTULA PLACEMENT Right 02/25/2015   Procedure: RIGHT ARTERIOVENOUS (AV) FISTULA CREATION;  Surgeon: Serafina Mitchell, MD;  Location: [place];  Service: Vascular;  Laterality: Right;   AV FISTULA PLACEMENT Right 09/15/2015   Procedure: ARTERIOVENOUS (AV) FISTULA CREATION- RIGHT ARM;  Surgeon: Serafina Mitchell, MD;  Location: [place];  Service: Vascular;  Laterality: Right;   CAPD INSERTION N/A 05/18/2014   Procedure: LAPAROSCOPIC INSERTION CONTINUOUS AMBULATORY PERITONEAL DIALYSIS  (CAPD) CATHETER;  Surgeon: Ralene Ok, MD;  Location: [place];  Service: General;  Laterality: N/A;   COLONOSCOPY     COLONOSCOPY WITH PROPOFOL N/A 07/12/2016   Procedure: COLONOSCOPY WITH PROPOFOL;  Surgeon: [name], MD;  Location: [facility];  Service: Endoscopy;  Laterality: N/A;   declotting of fistula  08/2015   ESOPHAGOGASTRODUODENOSCOPY (EGD) WITH PROPOFOL N/A 07/12/2016   Procedure: ESOPHAGOGASTRODUODENOSCOPY (EGD) WITH PROPOFOL;  Surgeon: [name], MD;  Location: [facility];  Service: Endoscopy;  Laterality: N/A;   FISTULA SUPERFICIALIZATION Right 02/07/2016   Procedure: RIIGHT UPPER ARM FISTULA SUPERFICIALIZATION;  Surgeon: Elam Dutch, MD;  Location: [place];  Service: Vascular;  Laterality: Right;   IJ catheter insertion     INSERTION OF DIALYSIS CATHETER Right 02/25/2015   Procedure: INSERTION OF RIGHT INTERNAL JUGULAR DIALYSIS CATHETER;  Surgeon: Serafina Mitchell, MD;  Location: [place] [facility];  Service: Vascular;  Laterality: Right;   KIDNEY TRANSPLANT     LAPAROSCOPIC REPOSITIONING CAPD CATHETER N/A 06/16/2014   Procedure: LAPAROSCOPIC REPOSITIONING CAPD CATHETER;  Surgeon: Ralene Ok, MD;  Location: [place];  Service: General;  Laterality: N/A;   LAPAROSCOPY N/A 05/04/2015   Procedure: LAPAROSCOPY DIAGNOSTIC LYSIS OF ADHESIONS;  Surgeon: Coralie Keens, MD;  Location: [place];  Service: General;  Laterality: N/A;   MINOR REMOVAL OF PERITONEAL DIALYSIS CATHETER N/A 04/28/2015   Procedure:  REMOVAL  OF PERITONEAL DIALYSIS CATHETER;  Surgeon: Coralie Keens, MD;  Location: [place];  Service: General;  Laterality: N/A;   NEPHRECTOMY Left 1974   RENAL BIOPSY Right 2012   REVISON OF ARTERIOVENOUS FISTULA Right 05/26/2015   Procedure: SUPERFICIALIZATION OF ARTERIOVENOUS FISTULA WITH SIDE BRANCH LIGATIONS;  Surgeon: Serafina Mitchell, MD;  Location: [facility];  Service: Vascular;  Laterality: Right;   RIGHT/LEFT HEART CATH AND CORONARY ANGIOGRAPHY  N/A 08/22/2021   Procedure: RIGHT/LEFT HEART CATH AND CORONARY ANGIOGRAPHY;  Surgeon: Troy Sine, MD;  Location: [place] [facility];  Service: Cardiovascular;  Laterality: N/A;   SKIN CANCER EXCISION     right shoulder   SVT ABLATION N/A 11/23/2016   Procedure: SVT Ablation;  Surgeon: Will Meredith Leeds, MD;  Location: [place] [facility];  Service: Cardiovascular;  Laterality: N/A;   UMBILICAL HERNIA REPAIR N/A 05/18/2014   Procedure: HERNIA REPAIR UMBILICAL ADULT;  Surgeon: Ralene Ok, MD;  Location: [place];  Service: General;  Laterality: N/A;   UMBILICAL HERNIA REPAIR N/A 04/28/2015   Procedure: UMBILICAL HERNIA REPAIR WITH MESH;  Surgeon: Coralie Keens, MD;  Location: [place];  Service: General;  Laterality: N/A;    Left nephrectomy in 1974.  No Known Allergies  Current Outpatient Medications  Medication Sig Dispense Refill   ACCU-CHEK FASTCLIX LANCETS MISC Use lancets to check blood sugar 5 times daily. 500 each 3   amLODipine (NORVASC) 10 MG tablet TAKE 1 TABLET BY MOUTH EVERY DAY 90 tablet 1   amoxicillin (AMOXIL) 500 MG capsule Take 2,000 mg by mouth See admin instructions. Take 4 capsules (2000 mg) by mouth 1 hour prior to dental appointments.     aspirin EC 81 MG tablet Take 81 mg by mouth daily.     B-D UF III MINI PEN NEEDLES 31G X 5 MM MISC USE 4 PER DAY TO INJECT INSULIN. 100 each 5   BD INSULIN SYRINGE U/F 31G X 5/16" 1 ML MISC USE 3 TIMES A DAY WITH HUMULIN R 90 each 2   belatacept (NULOJIX) 250 MG SOLR injection Inject 24.5 mLs  into the vein every 28 (twenty-eight) days.      Blood Glucose Monitoring Suppl (ACCU-CHEK GUIDE) w/Device KIT 1 each by Does not apply route daily. Use meter to check blood sugar 5 times daily. 1 kit 0   Blood Glucose Monitoring Suppl (FIFTY50 GLUCOSE METER 2.0) w/Device KIT Use as directed     cholecalciferol (VITAMIN D3) 25 MCG (1000 UNIT) tablet Take 1,000 Units by mouth in the morning and at bedtime.     Continuous Blood Gluc Sensor (FREESTYLE LIBRE 3 SENSOR) MISC 1 each by Does not apply route every 14 (fourteen) days. Place 1 sensor on the skin every 14 days. Use to check glucose continuously 6 each 3   DULoxetine (CYMBALTA) 60 MG capsule Take 60 mg by mouth daily.     fluocinonide cream (LIDEX) 2.69 % Apply 1 application topically daily as needed (Skin inflammation (hand, thigh, back)).      furosemide (LASIX) 20 MG tablet Take 20 mg by mouth daily as needed for fluid.     gabapentin (NEURONTIN) 100 MG capsule Take 200-300 mg by mouth See admin instructions. Take 2 capsules (200 mg) by mouth in the morning and take 3 capsules (300 mg) by mouth at bedtime     glucose blood (ACCU-CHEK GUIDE) test strip 1 each by Other route as needed for other. Use as instructed to check blood sugar 5 times daily. 500 each 3   HUMULIN R 500 UNIT/ML injection USE MAX 200 UNITS IN PUMP EVERY THREE DAYS 60 mL 2   insulin aspart (NOVOLOG FLEXPEN) 100 UNIT/ML FlexPen Inject 5-20 Units into the skin 3 (three) times daily before meals.     Insulin Syringe-Needle U-100 (BD INSULIN SYRINGE ULTRAFINE) 31G X 5/16" 0.3 ML MISC Use to inject insulin 2-3 times per day 300 each 1   lactulose (CHRONULAC)  10 GM/15ML solution Take 10-20 g by mouth daily as needed (constipation).     Lancets Misc. (ACCU-CHEK FASTCLIX LANCET) KIT 1 each by Does not apply route daily. 1 kit 0   levothyroxine (SYNTHROID) 100 MCG tablet TAKE 1 TABLET BY MOUTH EVERY DAY 90 tablet 3   lisinopril (ZESTRIL) 20 MG tablet Take 20 mg by mouth 2 (two) times  daily.     metoprolol tartrate (LOPRESSOR) 50 MG tablet Take 1 tablet (50 mg total) by mouth 2 (two) times daily. 180 tablet 3   mycophenolate (CELLCEPT) 250 MG capsule Take 1,000 mg by mouth every 12 (twelve) hours.      NEEDLE, DISP, 25 G (B-D DISP NEEDLE 25GX1") 25G X 1" MISC USE TO INJECT TESTOSTERONE (EXCHANGE NEEDLE FROM 22G 3ML SYRINGE) 200 each 2   predniSONE (DELTASONE) 5 MG tablet Take 5 mg by mouth daily with breakfast.     rosuvastatin (CRESTOR) 20 MG tablet Take 20 mg by mouth at bedtime.     senna-docusate (SENOKOT-S) 8.6-50 MG tablet Take 1 tablet by mouth as needed for mild constipation.     Syringe, Disposable, (SYRINGE LUER LOCK) 3 ML MISC Use 1 syringe once weekly to inject Testosterone medication. 100 each 1   tamsulosin (FLOMAX) 0.4 MG CAPS capsule Take 0.8 mg by mouth at bedtime.      testosterone cypionate (DEPOTESTOSTERONE CYPIONATE) 200 MG/ML injection Inject 0.6 mLs (120 mg total) into the muscle every 7 (seven) days. 10 mL 1   traMADol (ULTRAM) 50 MG tablet Take 1 tablet (50 mg total) by mouth every 6 (six) hours as needed. (Patient taking differently: Take 150 mg by mouth in the morning and at bedtime.) 15 tablet 0   traZODone (DESYREL) 50 MG tablet Take 50-100 mg by mouth at bedtime as needed for sleep.      UNABLE TO FIND BiPAP: At bedtime     zolpidem (AMBIEN) 10 MG tablet Take 10 mg by mouth at bedtime as needed for sleep.     No current facility-administered medications for this visit.   Social history is notable in that he is married for over 30 years. His 3 children. He has a degree in chemistry. 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 He does drink occasional alcohol. He has smoked cigars.  ROS General: Negative; No fevers, chills, or night sweats; positive for morbid obesity HEENT: Negative; No changes in vision or hearing, sinus congestion, difficulty swallowing Pulmonary: Recent cough with some purulent sputum, without fever, treated for bilateral  pneumonia by Dr. Osborne Casco noted on chest x-ray Cardiovascular: See HPI GI: Negative; No nausea, vomiting, diarrhea, or abdominal pain GU: Negative; No dysuria, hematuria, or difficulty voiding Renal: no longer on dialysis, status post renal transplant Musculoskeletal: Negative; no myalgias, joint pain, or weakness Hematologic/Oncology: Negative; no easy bruising, bleeding Endocrine: Positive for diabetes and hypothyroidism Neuro: Negative; no changes in balance, headaches Skin: Negative; No rashes or skin lesions Psychiatric: Negative; No behavioral problems, depression Sleep: Positive for OSA on BiPAP; no daytime sleepiness, hypersomnolence, bruxism, restless legs, hypnogognic hallucinations, no cataplexy Other comprehensive 14 point system review is negative.  PE BP 140/78 (BP Location: Left Arm)   Pulse 81   Ht $R'5\' 11"'Vf$  (1.803 m)   Wt 295 lb 6.4 oz (134 kg)   SpO2 95%   BMI 41.20 kg/m    Left arm blood pressure 130/78;  he has a right upper arm fistula.  Wt Readings from Last 3 Encounters:  09/18/21 295 lb 6.4  oz (134 kg)  09/11/21 295 lb (133.8 kg)  08/18/21 289 lb 3.2 oz (131.2 kg)   General: Alert, oriented, no distress.  Skin: normal turgor, no rashes, warm and dry HEENT: Normocephalic, atraumatic. Pupils equal round and reactive to light; sclera anicteric; extraocular muscles intact; Nose without nasal septal hypertrophy Mouth/Parynx benign; Mallinpatti scale 4 Neck: No JVD, no carotid bruits; normal carotid upstroke Lungs: clear to ausculatation and percussion; no wheezing or rales Chest wall: without tenderness to palpitation Heart: PMI not displaced, RRR, s1 s2 normal, 4-0/9 systolic murmur, no diastolic murmur, no rubs, gallops, thrills, or heaves Abdomen: soft, nontender; no hepatosplenomehaly, BS+; abdominal aorta nontender and not dilated by palpation. Back: no CVA tenderness Pulses 2+ right arm fistula; right groin cath site stable Musculoskeletal: full range of  motion, normal strength, no joint deformities Extremities: no clubbing cyanosis or edema, Homan's sign negative  Neurologic: grossly nonfocal; Cranial nerves grossly wnl Psychologic: Normal mood and affect   September 18, 2021 ECG (independently read by me): Normal sinus rhythm at 81 bpm, QS complex V1 through V3.  Inferior and lateral ST-T changes.  August 01, 2021 ECG (independently read by me):  NSR at 66, STT changes inferolaterally  July 11, 2021 ECG (independently read by me):  NSR at 69, LVH with repolarization, no ectopy, normal intervals    May 16, 2021 ECG (independently read by me):  Sinus rhythm at 61 with sinus arrythmia; LVH, T wave abnormality I, aVL, normal intervals  February 2022 ECG (independently read by me): NSR at 70; QS anteroseptally, inferolateral T wave abnormality  September 14, 2020 ECG (independently read by me): Normal sinus rhythm at 73 bpm.  Poor anterior R wave progression V1 through V3.  Mild ST changes laterally.  Normal intervals.  No ectopy.  November 20, 2019 ECG (independently read by me): Normal sinus rhythm at 68 bpm.  Q-wave lead III.  Mild inferior T wave abnormality.  Normal intervals.  QTc interval 440 ms.  No ectopy  May 2019 ECG (independently read by me): NSR at 64; normal intervals.  No ectopy  November 2018 ECG (independently read by me): normal sinus rhythm at 67 bpm.  Normal intervals.  No ectopy.  September 2018 ECG (independently read by me): normal sinus rhythm at 71 bpm.  No ST segment changes.  Normal intervals.  May 2018 ECG (independently read by me): Normal sinus rhythm at 89 bpm.  No STT changes .  PR interval 126 ms.  QTc interval 457 ms. Normal ECG.  October 2017 ECG (independently read by me): Sinus tachycardia 101 bpm.  April 2017 ECG (independently read by me): Sinus tachycardia 100 bpm.  2.  TC interval 45 ms.  November 2014 ECG: Normal sinus rhythm;  PR interval 138 ms. QT interval 448 ms.  LABS: BMP Latest Ref  Rng & Units 08/22/2021 08/22/2021 08/22/2021  Glucose 70 - 99 mg/dL - - -  BUN 8 - 27 mg/dL - - -  Creatinine 0.76 - 1.27 mg/dL - - -  BUN/Creat Ratio 10 - 24 - - -  Sodium 135 - 145 mmol/L 136 137 137  Potassium 3.5 - 5.1 mmol/L 4.8 4.8 4.8  Chloride 96 - 106 mmol/L - - -  CO2 20 - 29 mmol/L - - -  Calcium 8.6 - 10.2 mg/dL - - -   Hepatic Function Latest Ref Rng & Units 05/31/2020 03/03/2020 02/12/2020  Total Protein 6.0 - 8.3 g/dL 6.3 5.9(L) 6.5  Albumin 3.5 - 5.2 g/dL 4.1  3.8 4.4  AST 0 - 37 U/L $Remo'14 21 15  'Wredw$ ALT 0 - 53 U/L $Remo'22 23 20  'PkLdM$ Alk Phosphatase 39 - 117 U/L 67 73 87  Total Bilirubin 0.2 - 1.2 mg/dL 1.0 0.9 0.8    CBC Latest Ref Rng & Units 08/22/2021 08/22/2021 08/22/2021  WBC 3.4 - 10.8 x10E3/uL - - -  Hemoglobin 13.0 - 17.0 g/dL 13.6 14.3 14.3  Hematocrit 39.0 - 52.0 % 40.0 42.0 42.0  Platelets 150 - 450 x10E3/uL - - -    Lab Results  Component Value Date   MCV 84 08/18/2021   MCV 83.9 12/13/2020   MCV 85.1 05/31/2020   Lab Results  Component Value Date   TSH 1.15 07/04/2021   Lab Results  Component Value Date   HGBA1C 8.1 (A) 07/10/2021   Lipid Panel     Component Value Date/Time   CHOL 148 07/04/2021 0905   TRIG 54.0 07/04/2021 0905   HDL 69.70 07/04/2021 0905   CHOLHDL 2 07/04/2021 0905   VLDL 10.8 07/04/2021 0905   LDLCALC 67 07/04/2021 0905    RADIOLOGY: No results found.  ECHO: 05/02/2021 IMPRESSIONS   1. Left ventricular ejection fraction, by estimation, is 60 to 65%. The  left ventricle has normal function. The left ventricle has no regional  wall motion abnormalities. There is moderate left ventricular hypertrophy.  Left ventricular diastolic  parameters are consistent with Grade II diastolic dysfunction  (pseudonormalization). Elevated left ventricular end-diastolic pressure.   2. Right ventricular systolic function is normal. The right ventricular  size is normal. There is moderately elevated pulmonary artery systolic  pressure.   3. Left  atrial size was moderately dilated.   4. The mitral valve is abnormal. Mild mitral valve regurgitation.  Moderate to severe mitral annular calcification.   5. The aortic valve is tricuspid. There is moderate calcification of the  aortic valve. Aortic valve regurgitation is trivial. Moderate to severe  aortic valve stenosis. Aortic valve area, by VTI measures 1.09 cm. Aortic  valve mean gradient measures 44.4   mmHg. Aortic valve Vmax measures 3.99 m/s. DI is 0.29 - based on LVOT  diameter of 2.2 cm.   6. Aortic dilatation noted. There is mild dilatation of the ascending  aorta, measuring 42 mm.   7. The inferior vena cava is dilated in size with >50% respiratory  variability, suggesting right atrial pressure of 8 mmHg.   Comparison(s): Changes from prior study are noted. 11/16/2020: LVEF 65-70%,  moderate AS -mean gradient 34.6 mmHg.     R/L HEART CATH: 08/22/2021    2nd Mrg lesion is 50% stenosed.   Lat 2nd Mrg lesion is 20% stenosed.   RPAV lesion is 80% stenosed.   Dist LM to Prox LAD lesion is 80% stenosed.   Prox LAD to Mid LAD lesion is 90% stenosed.   LV end diastolic pressure is mildly elevated.    Right Atrium RA: A-wave 7, V wave 4; mean 4 RV: 41/6 PA: 41/11 PW: A-wave 17, V wave 15; mean 15.  Initial AO: 114/60          LV: 161/22  Pullback: LV: 162/23 AO: 120/62  By the Fick method, cardiac output 7.3 L/min with cardiac index 2.9 L/min/m.  PVR: 1.8WU  Mean aortic gradient 40 mmHg Peak to peak aortic gradient 42 mmHg AVA: 0.95 cm2     IMPRESSION:  1. Stage D, severe, symptomatic aortic stenosis with New York Heart Association class III symptoms   2. Coronary  artery disease involving native coronary artery of native heart with angina pectoris ([place])   3. History of renal transplantation   4. Hyperlipidemia with target LDL less than 70   5. OSA on CPAP   6. Stage 3b chronic kidney disease ([place])   7. Type 2 diabetes mellitus with complication, with  long-term current use of insulin ([place])   8. Hypothyroidism, unspecified type   9. Preop cardiovascular exam     ASSESSMENT AND PLAN: Mr. Britten is a 60 year old Caucasian male who has a history of morbid obesity, hypertension, diabetes mellitus, obstructive sleep apnea on BiPAP, hyperlipidemia, and end-stage kidney disease.  He is status post remote nephrectomy in1974 and on 02/25/2017 after having been on hemodialysis for 3 years he underwent successful renal transplantation from a living donor done at [facility].  He developed a postoperative inguinal hernia, which subsequently was repaired.  There also was a history of possible diverticular bleeding.  He has a history of SVT, but he has not had any recurrence.  He is followed closely by the transplantation team at [facility].  I reviewed recent [place] records.  On exam he has a murmur of aortic stenosis.  An echo Doppler study in March 2021 showed an EF at 65 to 80%, grade 1 diastolic dysfunction.  He had moderate to severe aortic valve stenosis with a mean gradient of 39 and peak gradient of 58.7.  Aortic valve area is 1.18 cm.  His blood pressure when seen in December 2021 was excellent at he was on amlodipine 10 mg, furosemide as needed for swelling, lisinopril 20 mg twice a day, metoprolol tartrate 50 mg twice a day.  He is on rosuvastatin 20 mg daily for hyperlipidemia.  LDL cholesterol on November 24 was 75.  He is diabetic on insulin.  He continues to be on levothyroxine for hypothyroidism.  He is now on Nulojix in addition to CellCept for his immunosuppressive therapy.  He has monthly infusions with Nulojix and is scheduled to undergo his next infusion in several weeks.  His echo Doppler study from February 2022 was essentially  unchanged.  He had hyperdynamic LV function with EF 65 to 70%, moderate LVH, grade 2 diastolic dysfunction, and on her most recent echo aortic mean gradient was 34.6 with peak gradient of 57 and calculated  valve area of 1.05 cm.  Earlier this year he had begun to notice some exertional shortness of breath with activity and also some leg swelling.  His leg swelling typically is worse on the side of his treatment [place] which is right lower extremity.  His most recent echo Doppler study from May 02, 2021 continue to show excellent LV function with grade 2 diastolic dysfunction but his aortic stenosis has progressed and is now in the severe range with a mean gradient of 44 and peak instantaneous gradient of 64 mm.   Due to progressive symptoms of exertional shortness of breath, he ultimately underwent right and left heart cardiac catheterization on August 22, 2021.  This revealed significant three-vessel CAD with calcified 80% ostial LAD stenosis not felt amenable to PCI.  He has stage D, severe symptomatic aortic stenosis and New York Heart Association class III symptoms with shortness of breath and chronic diastolic congestive heart failure.  He has seen Dr. Cyndia Bent and CABG revascularization with AVR replacement with a bioprosthetic valve was discussed in detail.  Tentatively, this will be scheduled after Christmas and either late December or early January.  Mr. Voeltz will be  contacting Dr. Vivi Martens office within the next day definitive scheduling.  Presently, his renal function remains fairly stable and he continues to be followed closely at [facility] and undergoes monthly antirejection infusion therapy with Nulojix at the [facility] infusion center.  Most recent creatinines have been in the 1.8 range.  His blood pressure today is stable and on repeat by me was 130/72.  He continues to be on amlodipine 10 mg, lisinopril 20 mg twice a day, metoprolol tartrate 50 mg twice a day.  He is on levothyroxine 100 mcg for hypothyroidism.  He is diabetic on insulin.  He continues to be on rosuvastatin for hyperlipidemia.  He continues to use BiPAP with excellent compliance.  I obtained a download from November 10 through  September 15, 2021.  On that download he was averaging 8 hours and 32 minutes of BiPAP use per night.  At 18/14 pressure AHI was increased at 5 most likely due to significant mask leak on a daily basis.  He is sleeping well.  I will see him in 2 months in follow-up of his surgery including AVR and CABG revascularization.   Troy Sine, MD, [facility]  09/19/2021 7:47 PM

## 2021-09-18 NOTE — Patient Instructions (Signed)
Medication Instructions:  NO CHANGES   *If you need a refill on your cardiac medications before your next appointment, please call your pharmacy*   Lab Work:  NOT NEEDED   Testing/Procedures:  NOT NEEDED  Follow-Up: At [facility], you and your health needs are our priority.  As part of our continuing mission to provide you with exceptional heart care, we have created designated Provider Care Teams.  These Care Teams include your primary Cardiologist (physician) and Advanced Practice Providers (APPs -  Physician Assistants and Nurse Practitioners) who all work together to provide you with the care you need, when you need it.     Your next appointment:   2 month(s)  The format for your next appointment:   In Person  Provider:   Shelva Majestic, MD    Other Instructions   NO CHANGES WITH C-PAP  BEST WISHES WITH UPCOMING  SURGERY

## 2021-09-19 ENCOUNTER — Encounter: Payer: Self-pay | Admitting: Cardiovascular Disease

## 2021-09-29 NOTE — Pre-Procedure Instructions (Signed)
Surgical Instructions    Your procedure is scheduled on Thursday, October 05, 2021 at 7:30 AM.  Report to [facility] Main Entrance "A" at 5:30 A.M., then check in with the Admitting office.  Call this number if you have problems the morning of surgery:  [phone]   If you have any questions prior to your surgery date call [phone]: Open Monday-Friday 8am-4pm    Remember:  Do not eat or drink after midnight the night before your surgery    Take these medicines the morning of surgery with A SIP OF WATER:  amLODipine (NORVASC) DULoxetine (CYMBALTA)  gabapentin (NEURONTIN) levothyroxine (SYNTHROID)  metoprolol tartrate (LOPRESSOR) predniSONE (DELTASONE) rosuvastatin (CRESTOR) traMADol (ULTRAM) - if needed   Follow your surgeon's instructions on when to stop Aspirin and mycophenolate (CELLCEPT).  If no instructions were given by your surgeon then you will need to call the office to get those instructions.     As of today, STOP taking any Aleve, Naproxen, Ibuprofen, Motrin, Advil, Goody's, BC's, all herbal medications, fish oil, and all vitamins.  WHAT DO I DO ABOUT MY DIABETES MEDICATION?   Do not take your HUMULIN R  the night before surgery or the morning of surgery.  If your CBG is greater than 220 mg/dL, you may take  of your sliding scale insulin aspart (NOVOLOG FLEXPEN) insulin.   HOW TO MANAGE YOUR DIABETES BEFORE AND AFTER SURGERY  Why is it important to control my blood sugar before and after surgery? Improving blood sugar levels before and after surgery helps healing and can limit problems. A way of improving blood sugar control is eating a healthy diet by:  Eating less sugar and carbohydrates  Increasing activity/exercise  Talking with your doctor about reaching your blood sugar goals High blood sugars (greater than 180 mg/dL) can raise your risk of infections and slow your recovery, so you will need to focus on controlling your diabetes during the weeks  before surgery. Make sure that the doctor who takes care of your diabetes knows about your planned surgery including the date and location.  How do I manage my blood sugar before surgery? Check your blood sugar at least 4 times a day, starting 2 days before surgery, to make sure that the level is not too high or low.  Check your blood sugar the morning of your surgery when you wake up and every 2 hours until you get to the Short Stay unit.  If your blood sugar is less than 70 mg/dL, you will need to treat for low blood sugar: Do not take insulin. Treat a low blood sugar (less than 70 mg/dL) with  cup of clear juice (cranberry or apple), 4 glucose tablets, OR glucose gel. Recheck blood sugar in 15 minutes after treatment (to make sure it is greater than 70 mg/dL). If your blood sugar is not greater than 70 mg/dL on recheck, call [phone] for further instructions. Report your blood sugar to the short stay nurse when you get to Short Stay.  If you are admitted to the hospital after surgery: Your blood sugar will be checked by the staff and you will probably be given insulin after surgery (instead of oral diabetes medicines) to make sure you have good blood sugar levels. The goal for blood sugar control after surgery is 80-180 mg/dL.                      Do NOT Smoke (Tobacco/Vaping) or drink Alcohol 24 hours prior to  your procedure.  If you use a CPAP at night, you may bring all equipment for your overnight stay.   Contacts, glasses, piercing's, hearing aid's, dentures or partials may not be worn into surgery, please bring cases for these belongings.    For patients admitted to the hospital, discharge time will be determined by your treatment team.   Patients discharged the day of surgery will not be allowed to drive home, and someone needs to stay with them for 24 hours.  NO VISITORS WILL BE ALLOWED IN PRE-OP WHERE PATIENTS GET READY FOR SURGERY.  ONLY 1 SUPPORT PERSON MAY BE PRESENT  IN THE WAITING ROOM WHILE YOU ARE IN SURGERY.  IF YOU ARE TO BE ADMITTED, ONCE YOU ARE IN YOUR ROOM YOU WILL BE ALLOWED TWO (2) VISITORS.  Minor children may have two parents present. Special consideration for safety and communication needs will be reviewed on a case by case basis.   Special instructions:   [place]- Preparing For Surgery  Before surgery, you can play an important role. Because skin is not sterile, your skin needs to be as free of germs as possible. You can reduce the number of germs on your skin by washing with CHG (chlorahexidine gluconate) Soap before surgery.  CHG is an antiseptic cleaner which kills germs and bonds with the skin to continue killing germs even after washing.    Oral Hygiene is also important to reduce your risk of infection.  Remember - BRUSH YOUR TEETH THE MORNING OF SURGERY WITH YOUR REGULAR TOOTHPASTE  Please do not use if you have an allergy to CHG or antibacterial soaps. If your skin becomes reddened/irritated stop using the CHG.  Do not shave (including legs and underarms) for at least 48 hours prior to first CHG shower. It is OK to shave your face.  Please follow these instructions carefully.   Shower the NIGHT BEFORE SURGERY and the MORNING OF SURGERY  If you chose to wash your hair, wash your hair first as usual with your normal shampoo.  After you shampoo, rinse your hair and body thoroughly to remove the shampoo.  Use CHG Soap as you would any other liquid soap. You can apply CHG directly to the skin and wash gently with a scrungie or a clean washcloth.   Apply the CHG Soap to your body ONLY FROM THE NECK DOWN.  Do not use on open wounds or open sores. Avoid contact with your eyes, ears, mouth and genitals (private parts). Wash Face and genitals (private parts)  with your normal soap.   Wash thoroughly, paying special attention to the area where your surgery will be performed.  Thoroughly rinse your body with warm water from the neck  down.  DO NOT shower/wash with your normal soap after using and rinsing off the CHG Soap.  Pat yourself dry with a CLEAN TOWEL.  Wear CLEAN PAJAMAS to bed the night before surgery  Place CLEAN SHEETS on your bed the night before your surgery  DO NOT SLEEP WITH PETS.   Day of Surgery: Shower with CHG soap. Do not wear jewelry. Do not wear lotions, powders, colognes, or deodorant. Do not shave 48 hours prior to surgery. Do not bring valuables to the hospital. [facility] is not responsible for any belongings or valuables. Wear Clean/Comfortable clothing the morning of surgery Remember to brush your teeth WITH YOUR REGULAR TOOTHPASTE.   Please read over the following fact sheets that you were given.   3 days prior to  your procedure or After your COVID test   You are not required to quarantine however you are required to wear a well-fitting mask when you are out and around people not in your household. If your mask becomes wet or soiled, replace with a new one.   Wash your hands often with soap and water for 20 seconds or clean your hands with an alcohol-based hand sanitizer that contains at least 60% alcohol.   Do not share personal items.   Notify your provider:  o if you are in close contact with someone who has COVID  o or if you develop a fever of 100.4 or greater, sneezing, cough, sore throat, shortness of breath or body aches.

## 2021-10-03 ENCOUNTER — Ambulatory Visit (HOSPITAL_COMMUNITY): Admission: RE | Admit: 2021-10-03 | Payer: 59 | Source: Ambulatory Visit

## 2021-10-03 ENCOUNTER — Other Ambulatory Visit: Payer: Self-pay

## 2021-10-03 ENCOUNTER — Ambulatory Visit (HOSPITAL_COMMUNITY)
Admission: RE | Admit: 2021-10-03 | Discharge: 2021-10-03 | Disposition: A | Discharge status: Home / Self Care | Payer: 59 | Source: Ambulatory Visit | Attending: Surgery | Admitting: Surgery

## 2021-10-03 ENCOUNTER — Encounter (HOSPITAL_COMMUNITY): Payer: Self-pay

## 2021-10-03 ENCOUNTER — Ambulatory Visit (HOSPITAL_BASED_OUTPATIENT_CLINIC_OR_DEPARTMENT_OTHER)
Admission: RE | Admit: 2021-10-03 | Discharge: 2021-10-03 | Disposition: A | Discharge status: Home / Self Care | Payer: 59 | Source: Ambulatory Visit | Attending: Surgery | Admitting: Surgery

## 2021-10-03 ENCOUNTER — Encounter (HOSPITAL_COMMUNITY)
Admission: RE | Admit: 2021-10-03 | Discharge: 2021-10-03 | Disposition: A | Discharge status: Home / Self Care | Payer: 59 | Source: Ambulatory Visit | Attending: Surgery | Admitting: Surgery

## 2021-10-03 ENCOUNTER — Other Ambulatory Visit (HOSPITAL_COMMUNITY): Payer: 59

## 2021-10-03 ENCOUNTER — Telehealth: Payer: Self-pay

## 2021-10-03 VITALS — BP 151/86 | HR 81 | Temp 98.5°F | Resp 19 | Ht 71.0 in | Wt 293.5 lb

## 2021-10-03 DIAGNOSIS — Z20822 Contact with and (suspected) exposure to covid-19: Secondary | ICD-10-CM | POA: Insufficient documentation

## 2021-10-03 DIAGNOSIS — D649 Anemia, unspecified: Secondary | ICD-10-CM | POA: Insufficient documentation

## 2021-10-03 DIAGNOSIS — E785 Hyperlipidemia, unspecified: Secondary | ICD-10-CM | POA: Insufficient documentation

## 2021-10-03 DIAGNOSIS — E039 Hypothyroidism, unspecified: Secondary | ICD-10-CM | POA: Insufficient documentation

## 2021-10-03 DIAGNOSIS — I350 Nonrheumatic aortic (valve) stenosis: Secondary | ICD-10-CM | POA: Insufficient documentation

## 2021-10-03 DIAGNOSIS — I25119 Atherosclerotic heart disease of native coronary artery with unspecified angina pectoris: Secondary | ICD-10-CM | POA: Insufficient documentation

## 2021-10-03 DIAGNOSIS — E1122 Type 2 diabetes mellitus with diabetic chronic kidney disease: Secondary | ICD-10-CM | POA: Insufficient documentation

## 2021-10-03 DIAGNOSIS — J811 Chronic pulmonary edema: Secondary | ICD-10-CM | POA: Insufficient documentation

## 2021-10-03 DIAGNOSIS — Z87891 Personal history of nicotine dependence: Secondary | ICD-10-CM | POA: Insufficient documentation

## 2021-10-03 DIAGNOSIS — Z951 Presence of aortocoronary bypass graft: Secondary | ICD-10-CM | POA: Insufficient documentation

## 2021-10-03 DIAGNOSIS — R0600 Dyspnea, unspecified: Secondary | ICD-10-CM | POA: Insufficient documentation

## 2021-10-03 DIAGNOSIS — I1311 Hypertensive heart and chronic kidney disease without heart failure, with stage 5 chronic kidney disease, or end stage renal disease: Secondary | ICD-10-CM | POA: Insufficient documentation

## 2021-10-03 DIAGNOSIS — Z01818 Encounter for other preprocedural examination: Secondary | ICD-10-CM | POA: Insufficient documentation

## 2021-10-03 DIAGNOSIS — Z940 Kidney transplant status: Secondary | ICD-10-CM | POA: Insufficient documentation

## 2021-10-03 DIAGNOSIS — G4733 Obstructive sleep apnea (adult) (pediatric): Secondary | ICD-10-CM | POA: Insufficient documentation

## 2021-10-03 DIAGNOSIS — N186 End stage renal disease: Secondary | ICD-10-CM | POA: Insufficient documentation

## 2021-10-03 HISTORY — DX: Cardiac murmur, unspecified: R01.1

## 2021-10-03 HISTORY — DX: Atherosclerotic heart disease of native coronary artery without angina pectoris: I25.10

## 2021-10-03 LAB — URINALYSIS, ROUTINE W REFLEX MICROSCOPIC
Bilirubin Urine: NEGATIVE
Glucose, UA: >=500 mg/dL — AB
Ketones, ur: NEGATIVE mg/dL
Leukocytes,Ua: NEGATIVE
Nitrite: NEGATIVE
Protein, ur: >300 mg/dL — AB
Specific Gravity, Urine: 1.025 (ref 1.005–1.030)
pH: 6.5 (ref 5.0–8.0)

## 2021-10-03 LAB — BLOOD GAS, ARTERIAL
Acid-Base Excess: 0.1 mmol/L (ref 0.0–2.0)
Bicarbonate: 24.2 mmol/L (ref 20.0–28.0)
Drawn by: 60286
FIO2: 21
O2 Saturation: 94.7 %
Patient temperature: 37
pCO2 arterial: 38.9 mmHg (ref 32.0–48.0)
pH, Arterial: 7.41 (ref 7.350–7.450)
pO2, Arterial: 72.1 mmHg — ABNORMAL LOW (ref 83.0–108.0)

## 2021-10-03 LAB — GLUCOSE, CAPILLARY: Glucose-Capillary: 367 mg/dL — ABNORMAL HIGH (ref 70–99)

## 2021-10-03 LAB — COMPREHENSIVE METABOLIC PANEL
ALT: 22 U/L (ref 0–44)
AST: 22 U/L (ref 15–41)
Albumin: 3.6 g/dL (ref 3.5–5.0)
Alkaline Phosphatase: 59 U/L (ref 38–126)
Anion gap: 9 (ref 5–15)
BUN: 34 mg/dL — ABNORMAL HIGH (ref 8–23)
CO2: 20 mmol/L — ABNORMAL LOW (ref 22–32)
Calcium: 9.6 mg/dL (ref 8.9–10.3)
Chloride: 104 mmol/L (ref 98–111)
Creatinine, Ser: 2.3 mg/dL — ABNORMAL HIGH (ref 0.61–1.24)
GFR, Estimated: 31 mL/min — ABNORMAL LOW (ref >60)
Glucose, Bld: 325 mg/dL — ABNORMAL HIGH (ref 70–99)
Potassium: 5.1 mmol/L (ref 3.5–5.1)
Sodium: 133 mmol/L — ABNORMAL LOW (ref 135–145)
Total Bilirubin: 2.8 mg/dL — ABNORMAL HIGH (ref 0.3–1.2)
Total Protein: 5.7 g/dL — ABNORMAL LOW (ref 6.5–8.1)

## 2021-10-03 LAB — SURGICAL PCR SCREEN
MRSA, PCR: NEGATIVE
Staphylococcus aureus: NEGATIVE

## 2021-10-03 LAB — TYPE AND SCREEN
ABO/RH(D): A POS
Antibody Screen: NEGATIVE

## 2021-10-03 LAB — CBC
HCT: 49.2 % (ref 39.0–52.0)
Hemoglobin: 15.7 g/dL (ref 13.0–17.0)
MCH: 29.5 pg (ref 26.0–34.0)
MCHC: 31.9 g/dL (ref 30.0–36.0)
MCV: 92.5 fL (ref 80.0–100.0)
Platelets: 215 10*3/uL (ref 150–400)
RBC: 5.32 MIL/uL (ref 4.22–5.81)
RDW: 15.7 % — ABNORMAL HIGH (ref 11.5–15.5)
WBC: 13.9 10*3/uL — ABNORMAL HIGH (ref 4.0–10.5)
nRBC: 0 % (ref 0.0–0.2)

## 2021-10-03 LAB — URINALYSIS, MICROSCOPIC (REFLEX): Squamous Epithelial / HPF: NONE SEEN (ref 0–5)

## 2021-10-03 LAB — PROTIME-INR
INR: 1 (ref 0.8–1.2)
Prothrombin Time: 13.3 seconds (ref 11.4–15.2)

## 2021-10-03 LAB — APTT: aPTT: 29 seconds (ref 24–36)

## 2021-10-03 LAB — SARS CORONAVIRUS 2 (TAT 6-24 HRS): SARS Coronavirus 2: NEGATIVE

## 2021-10-03 LAB — HEMOGLOBIN A1C
Hgb A1c MFr Bld: 7.1 % — ABNORMAL HIGH (ref 4.8–5.6)
Mean Plasma Glucose: 157.07 mg/dL

## 2021-10-03 NOTE — Telephone Encounter (Signed)
Patient contacted the office today concerned about a "persistent" sore throat that started Friday, 12/23. He states that he did a at home COVID test that day and it was negative. He went to an Urgent Care yesterday, 12/26 and was tested for Flu/ COVID/Strep and was negative for all. He states that he does have post-nasal drip a lot and the urgent care felt this is the case this time. He just wanted to make the office aware as he is scheduled for CABG with Dr. Cyndia Bent 10/05/21 and has pre-op testing today. Advised to keep appointment with testing due today, but to give the office a call if any of his symptoms changes, has a fever, or begins to feel unwell. He acknowledged receipt.

## 2021-10-03 NOTE — Progress Notes (Addendum)
PCP: Domenick Gong, MD Cardiologist: Shelva Majestic, MD Endocrinologist: Elayne Snare, MD Nephrologist: [facility] kidney transplant team. No locol nephrologist.  EKG: 09/18/21 CXR: 10/03/21 ECHO: 05/02/21 Stress Test: 10/19/15 Cardiac Cath: 08/22/21  Fasting Blood Sugar- 130-150 Checks Blood Sugar_1-4__ times a day Wears [place] with Diabetic coordinator as BG was 367 at PAT.  Per patient, had problem with Omni Pod last night and has not had his insulin. Ebony Hail, [place] aware.   ASA: Continue Blood Thinner: No  OSA/BIPAP: Yes, wears nightly  Covid test 10/03/21.  Patient states has sore throat. States was covid tested yesterday at urgent care and was negative.   Anesthesia Review: Yes, cardiac history.  Difficult intubation. Abnormal labs  Patient denies shortness of breath, fever, cough, and chest pain at PAT appointment.  Patient verbalized understanding of instructions provided today at the PAT appointment.  Patient asked to review instructions at home and day of surgery.

## 2021-10-03 NOTE — Progress Notes (Signed)
Pre cabg has been completed.   Preliminary results in CV Proc.   Robert [name] 10/03/2021 3:02 PM

## 2021-10-03 NOTE — Progress Notes (Addendum)
Anesthesia Chart Review:  Case: [id number] Date/Time: 10/05/21 0715   Procedures:      CORONARY ARTERY BYPASS GRAFTING (CABG) (Chest)     AORTIC VALVE REPLACEMENT (AVR) (Chest)     TRANSESOPHAGEAL ECHOCARDIOGRAM (TEE)   Anesthesia type: General   Pre-op diagnosis:      CAD     AS   Location: [facility] OR ROOM 65 / [place] [facility]   Surgeons: [name], MD       DISCUSSION: Patient is a 60 year old male scheduled for the above procedure.  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  Anesthesia records [facility] 06/24/19 TURP (see Care Everywhere): Rapid Sequence Induction: No Preoxygenated: yes Patient position: .sniffing Endotracheal/SGA details: ETT Cuffed: yes  Successful intubation technique: video laryngoscopy Endotracheal tube insertion site: oral Blade: McGrath Blade size: #3 ETT size (mm): 7.5 Placement verified by: chest auscultation and capnometry  Initial leak: No Measured from: lips ETT to lips (cm): 23 Number of attempts at approach: 1   His CBG at PAT was 367, but reported he had to remove his OmniPod the night prior due to issues with the pump. He will be able to replace the Omnipod once at home following PAT visit. A1c 7.1%. He is on Humulin R 500 Unit/mL via pump, as well as Novolog 100 Unit/mL as needed  hyperglycemia.   Creatinine 2.30, up from ~ 1.7-1.9 since 05/2021. WBC 13.9 and total bilirubin 2.8 with normal AST/ALT. This was communicated to TCTS RN. This will be reviewed with Dr. Cyndia Bent. I've asked his staff to let me know if he prefers patient get any repeat labs on arrival for surgery (versus following post-operatively). He is on anti-rejection medication including daily prednisone.   He reported persistent sore throat that started 09/29/21, otherwise denied cough, fever, change in taste/smell and otherwise did not feel unwell. He went to urgent care on 10/02/21 to be tested and was negative for flu, Covid, and Strep. He had another COVID-19 test on 10/03/21 during his PAT visit which was also negative. He was advised if he does develop any worsening or new symptoms to contact Dr. Earlene Plater' office. He has communicated with Dr. Vivi Martens office as well.   Anesthesia team to evaluate on the day of surgery. (UPDATE 10/04/21 3:45 PM: Repeat BMET on arrival per Dr. Cyndia Bent given increase in Creatinine with underlying CKD/renal transplant. STAT BMET for the day of surgery entered.)   VS: BP (!) 151/86    Pulse 81    Temp 36.9 C (Oral)    Resp 19    Ht $R'5\' 11"'MJ$  (1.803 m)    Wt 133.1 kg    SpO2 95%    BMI 40.93 kg/m    PROVIDERS: [name], [name], MD is PCP  Shelva Majestic, MD is cardiologist Elayne Snare, MD is endocrinologist Nephrologist is with the [facility] kidney transplant team. He is no longer being followed by Corliss Parish, MD since his transplant in 2018.        LABS:  Preoperative labs noted. See DISCUSSION. (all labs ordered are listed, but only abnormal results are displayed)  Labs Reviewed  GLUCOSE, CAPILLARY - Abnormal; Notable for the following components:      Result Value   Glucose-Capillary 367 (*)    All other components within normal limits  BLOOD GAS, ARTERIAL - Abnormal; Notable for the following components:   pO2, Arterial 72.1 (*)    Allens test (pass/fail)  BRACHIAL ARTERY (*)    All other components within normal limits  CBC - Abnormal; Notable for the following components:   WBC 13.9 (*)    RDW 15.7 (*)    All other components within normal limits  COMPREHENSIVE METABOLIC PANEL - Abnormal; Notable for the following components:   Sodium 133 (*)    CO2 20 (*)    Glucose, Bld 325 (*)    BUN 34 (*)    Creatinine, Ser 2.30 (*)    Total Protein 5.7 (*)    Total Bilirubin 2.8 (*)    GFR, Estimated 31 (*)    All other components within normal limits  HEMOGLOBIN A1C - Abnormal; Notable for the following components:   Hgb A1c MFr Bld 7.1 (*)    All other components within normal limits  URINALYSIS, ROUTINE W REFLEX MICROSCOPIC - Abnormal; Notable for the following components:   Glucose, UA >=500 (*)    Hgb urine dipstick SMALL (*)    Protein, ur >300 (*)    All other components within normal limits  URINALYSIS, MICROSCOPIC (REFLEX) - Abnormal; Notable for the following components:   Bacteria, UA RARE (*)    All other components within normal limits  SARS CORONAVIRUS 2 (TAT 6-24 HRS)  SURGICAL PCR SCREEN  APTT  PROTIME-INR  TYPE AND SCREEN     IMAGES: CXR 10/03/21: FINDINGS: The heart size and mediastinal contours are within normal limits. Pulmonary vascular congestion with mild upper lobe predominant interstitial thickening. Chronic blunting/scarring of the left costophrenic angle. No focal consolidation, pleural effusion, or pneumothorax. No acute osseous abnormality. Unchanged right subclavian stent. IMPRESSION: 1. Mild pulmonary vascular congestion and interstitial edema.   EKG: 09/18/21: Normal sinus rhythm Possible left atrial enlargement Cannot rule out anterior infarct, age undetermined ST and T wave abnormality, consider inferolateral ischemia   CV: US Carotid 10/03/21: Summary:  Right Carotid: Velocities in the right ICA are consistent with a 1-39%  stenosis.  Left Carotid: Velocities in the left ICA are  consistent with a 1-39%  stenosis.  Vertebrals: Bilateral vertebral arteries demonstrate antegrade flow.    RHC/LHC 08/22/21:   2nd Mrg lesion is 50% stenosed.   Lat 2nd Mrg lesion is 20% stenosed.   RPAV lesion is 80% stenosed.   Dist LM to Prox LAD lesion is 80% stenosed.   Prox LAD to Mid LAD lesion is 90% stenosed.   LV end diastolic pressure is mildly elevated.    Echo 05/02/21: IMPRESSIONS   1. Left ventricular ejection fraction, by estimation, is 60 to 65%. The  left ventricle has normal function. The left ventricle has no regional  wall motion abnormalities. There is moderate left ventricular hypertrophy.  Left ventricular diastolic  parameters are consistent with Grade II diastolic dysfunction  (pseudonormalization). Elevated left ventricular end-diastolic pressure.   2. Right ventricular systolic function is normal. The right ventricular  size is normal. There is moderately elevated pulmonary artery systolic  pressure.   3. Left atrial size was moderately dilated.   4. The mitral valve is abnormal. Mild mitral valve  regurgitation.  Moderate to severe mitral annular calcification.   5. The aortic valve is tricuspid. There is moderate calcification of the  aortic valve. Aortic valve regurgitation is trivial. Moderate to severe  aortic valve stenosis. Aortic valve area, by VTI measures 1.09 cm. Aortic  valve mean gradient measures 44.4   mmHg. Aortic valve Vmax measures 3.99 m/s. DI is 0.29 - based on LVOT  diameter of 2.2 cm.   6. Aortic dilatation noted. There is mild dilatation of the ascending  aorta, measuring 42 mm.   7. The inferior vena cava is dilated in size with >50% respiratory  variability, suggesting right atrial pressure of 8 mmHg.  - Comparison(s): Changes from prior study are noted. 11/16/2020: LVEF 65-70%,  moderate AS -mean gradient 34.6 mmHg.    Past Medical History:  Diagnosis Date   Anemia    low hgb at present   Arthritis    FEET   Blood  transfusion without reported diagnosis 1974   kidney removed - L   Cancer (HCC)    skin ca right shoulder, plastic dsyplasia, pre-Ca polpys removed on Colonoscopy- 07/2014   Colon polyps 07/23/2014   Tubular adenomas x 5   Constipation    Coronary artery disease    Diabetes mellitus without complication (HCC)    Type 2   Difficult intubation    unsure of actual problem but it was during the January 28, 2015 procedure.   Dysrhythmia    Eczema    ESRD (end stage renal disease) (HCC)    hEMO mwf   Headache(784.0)    slight dull h/a due kidney failure   Heart murmur    Hyperlipidemia    Hypertension    SVT h/o , followed by Dr. Tresa Endo   Hypothyroidism    Macular degeneration    Neuropathy    right hand and both feet   PAT (paroxysmal atrial tachycardia) (HCC)    PSVT (paroxysmal supraventricular tachycardia) (HCC)    SBO (small bowel obstruction) (HCC)    Shortness of breath    with exertion   Sleep apnea 06/18/2011   split-night sleep study- [facility]., BiPAP- 13-16   Thyroid disease    Umbilical hernia    will repair with thi surgery    Past Surgical History:  Procedure Laterality Date   AV FISTULA PLACEMENT Right 02/25/2015   Procedure: RIGHT ARTERIOVENOUS (AV) FISTULA CREATION;  Surgeon: Nada Libman, MD;  Location: [facility];  Service: Vascular;  Laterality: Right;   AV FISTULA PLACEMENT Right 09/15/2015   Procedure: ARTERIOVENOUS (AV) FISTULA CREATION- RIGHT ARM;  Surgeon: Nada Libman, MD;  Location: [facility];  Service: Vascular;  Laterality: Right;   CAPD INSERTION N/A 05/18/2014   Procedure: LAPAROSCOPIC INSERTION CONTINUOUS AMBULATORY PERITONEAL DIALYSIS  (CAPD) CATHETER;  Surgeon: Axel Filler, MD;  Location: [facility];  Service: General;  Laterality: N/A;   COLONOSCOPY     COLONOSCOPY WITH PROPOFOL N/A 07/12/2016   Procedure: COLONOSCOPY WITH PROPOFOL;  Surgeon: Rachael Fee, MD;  Location: [facility];  Service: Endoscopy;  Laterality: N/A;    declotting of fistula  08/2015   ESOPHAGOGASTRODUODENOSCOPY (EGD) WITH PROPOFOL N/A 07/12/2016   Procedure: ESOPHAGOGASTRODUODENOSCOPY (EGD) WITH PROPOFOL;  Surgeon: Rachael Fee, MD;  Location: [facility];  Service: Endoscopy;  Laterality: N/A;   FISTULA SUPERFICIALIZATION Right 02/07/2016   Procedure: RIIGHT UPPER ARM FISTULA SUPERFICIALIZATION;  Surgeon: Sherren Kerns, MD;  Location: [facility] OR;  Service: Vascular;  Laterality: Right;  IJ catheter insertion     INSERTION OF DIALYSIS CATHETER Right 02/25/2015   Procedure: INSERTION OF RIGHT INTERNAL JUGULAR DIALYSIS CATHETER;  Surgeon: Serafina Mitchell, MD;  Location: [place];  Service: Vascular;  Laterality: Right;   KIDNEY TRANSPLANT     LAPAROSCOPIC REPOSITIONING CAPD CATHETER N/A 06/16/2014   Procedure: LAPAROSCOPIC REPOSITIONING CAPD CATHETER;  Surgeon: Ralene Ok, MD;  Location: [place];  Service: General;  Laterality: N/A;   LAPAROSCOPY N/A 05/04/2015   Procedure: LAPAROSCOPY DIAGNOSTIC LYSIS OF ADHESIONS;  Surgeon: Coralie Keens, MD;  Location: [place];  Service: General;  Laterality: N/A;   MINOR REMOVAL OF PERITONEAL DIALYSIS CATHETER N/A 04/28/2015   Procedure:  REMOVAL OF PERITONEAL DIALYSIS CATHETER;  Surgeon: Coralie Keens, MD;  Location: [place];  Service: General;  Laterality: N/A;   NEPHRECTOMY Left 1974   RENAL BIOPSY Right 2012   REVISON OF ARTERIOVENOUS FISTULA Right 05/26/2015   Procedure: SUPERFICIALIZATION OF ARTERIOVENOUS FISTULA WITH SIDE BRANCH LIGATIONS;  Surgeon: Serafina Mitchell, MD;  [facility];  Service: Vascular;  Laterality: Right;   RIGHT/LEFT HEART CATH AND CORONARY ANGIOGRAPHY N/A 08/22/2021   Procedure: RIGHT/LEFT HEART CATH AND CORONARY ANGIOGRAPHY;  Surgeon: Troy Sine, MD;  Location: [place] [facility];  Service: Cardiovascular;  Laterality: N/A;   SKIN CANCER EXCISION     right shoulder   SVT ABLATION N/A 11/23/2016   Procedure: SVT Ablation;  Surgeon: Will Meredith Leeds, MD;  Location: [place] [facility];  Service: Cardiovascular;  Laterality: N/A;   UMBILICAL HERNIA REPAIR N/A 05/18/2014   Procedure: HERNIA REPAIR UMBILICAL ADULT;  Surgeon: Ralene Ok, MD;  Location: [place];  Service: General;  Laterality: N/A;   UMBILICAL HERNIA REPAIR N/A 04/28/2015   Procedure: UMBILICAL HERNIA REPAIR WITH MESH;  Surgeon: Coralie Keens, MD;  Location: [place];  Service: General;  Laterality: N/A;    MEDICATIONS:  ACCU-CHEK FASTCLIX LANCETS MISC   amLODipine (NORVASC) 10 MG tablet   amoxicillin (AMOXIL) 500 MG capsule   aspirin EC 81 MG tablet   B-D UF III MINI PEN NEEDLES 31G X 5 MM MISC   BD INSULIN SYRINGE U/F 31G X 5/16" 1 ML MISC   belatacept (NULOJIX) 250 MG SOLR injection   Blood Glucose Monitoring Suppl (ACCU-CHEK GUIDE) w/Device KIT   Blood Glucose Monitoring Suppl (FIFTY50 GLUCOSE METER 2.0) w/Device KIT   cholecalciferol (VITAMIN D3) 25 MCG (1000 UNIT) tablet   Continuous Blood Gluc Sensor (FREESTYLE LIBRE 3 SENSOR) MISC   DULoxetine (CYMBALTA) 60 MG capsule   fluocinonide cream (LIDEX) 0.05 %   furosemide (LASIX) 20 MG tablet   gabapentin (NEURONTIN) 100 MG capsule   glucose blood (ACCU-CHEK GUIDE) test strip   HUMULIN R 500 UNIT/ML injection   insulin aspart (NOVOLOG FLEXPEN) 100 UNIT/ML FlexPen   Insulin Syringe-Needle U-100 (BD INSULIN SYRINGE ULTRAFINE) 31G X 5/16" 0.3 ML MISC   lactulose (CHRONULAC) 10 GM/15ML solution   Lancets Misc. (ACCU-CHEK FASTCLIX LANCET) KIT   levothyroxine (SYNTHROID) 100 MCG tablet   lisinopril (ZESTRIL) 20 MG tablet   metoprolol tartrate (LOPRESSOR) 50 MG tablet   mycophenolate (CELLCEPT) 250 MG capsule   NEEDLE, DISP, 25 G (B-D DISP NEEDLE 25GX1") 25G X 1" MISC   predniSONE (DELTASONE) 5 MG tablet   rosuvastatin (CRESTOR) 20 MG tablet   senna-docusate (SENOKOT-S) 8.6-50 MG tablet   Syringe, Disposable, (SYRINGE LUER LOCK) 3 ML MISC   tamsulosin (FLOMAX) 0.4 MG CAPS capsule   testosterone cypionate (DEPOTESTOSTERONE CYPIONATE)  200 MG/ML injection  traMADol (ULTRAM) 50 MG tablet   traZODone (DESYREL) 50 MG tablet   UNABLE TO FIND   zolpidem (AMBIEN) 10 MG tablet   No current facility-administered medications for this encounter.    [START ON 10/05/2021] ceFAZolin (ANCEF) IVPB 2g/100 mL premix   [START ON 10/05/2021] ceFAZolin (ANCEF) IVPB 3g/100 mL premix   [START ON 10/05/2021] dexmedetomidine (PRECEDEX) 400 MCG/100ML (4 mcg/mL) infusion   [START ON 10/05/2021] EPINEPHrine (ADRENALIN) 5 mg in NS 250 mL (0.02 mg/mL) premix infusion   [START ON 10/05/2021] heparin 30,000 units/NS 1000 mL solution for CELLSAVER   [START ON 10/05/2021] heparin sodium (porcine) 5,000 Units, papaverine 60 mg in electrolyte-A (PLASMALYTE-A PH 7.4) 1,000 mL irrigation   [START ON 10/05/2021] insulin regular, human (MYXREDLIN) 100 units/ 100 mL infusion   [START ON 10/05/2021] [place] Blood Cardioplegia vial (lidocaine/magnesium/mannitol 0.26g-4g-6.4g)   [START ON 10/05/2021] milrinone (PRIMACOR) 20 MG/100 ML (0.2 mg/mL) infusion   [START ON 10/05/2021] nitroGLYCERIN 50 mg in dextrose 5 % 250 mL (0.2 mg/mL) infusion   [START ON 10/05/2021] norepinephrine (LEVOPHED) 4mg  in 270mL (0.016 mg/mL) premix infusion   [START ON 10/05/2021] phenylephrine (NEO-SYNEPHRINE) 20mg /NS 231mL premix infusion   [START ON 10/05/2021] potassium chloride injection 80 mEq   [START ON 10/05/2021] tranexamic acid (CYKLOKAPRON) 2,500 mg in sodium chloride 0.9 % 250 mL (10 mg/mL) infusion   [START ON 10/05/2021] tranexamic acid (CYKLOKAPRON) bolus via infusion - over 30 minutes 1,129.5 mg   [START ON 10/05/2021] tranexamic acid (CYKLOKAPRON) pump prime solution 266 mg   [START ON 10/05/2021] vancomycin (VANCOREADY) IVPB 1500 mg/300 mL    Myra Gianotti, PA-C Surgical Short Stay/Anesthesiology [facility] Phone [phone] [facility] Phone [phone] 10/04/2021 11:34 AM

## 2021-10-04 ENCOUNTER — Encounter (HOSPITAL_COMMUNITY): Payer: Self-pay

## 2021-10-04 MED ORDER — EPINEPHRINE HCL 5 MG/250ML IV SOLN IN NS
0.0000 ug/min | INTRAVENOUS | Status: AC
  Administered 2021-10-05: 5 ug/min via INTRAVENOUS
  Filled 2021-10-04: qty 250

## 2021-10-04 MED ORDER — DEXMEDETOMIDINE HCL IN NACL 400 MCG/100ML IV SOLN
0.1000 ug/kg/h | INTRAVENOUS | Status: AC
  Administered 2021-10-05: 09:00:00 .3 ug/kg/h via INTRAVENOUS
  Filled 2021-10-04 (×2): qty 100

## 2021-10-04 MED ORDER — VANCOMYCIN HCL 1500 MG/300ML IV SOLN
1500.0000 mg | INTRAVENOUS | Status: AC
  Administered 2021-10-05: 08:00:00 1500 mg via INTRAVENOUS
  Filled 2021-10-04: qty 300

## 2021-10-04 MED ORDER — CEFAZOLIN IN SODIUM CHLORIDE 3-0.9 GM/100ML-% IV SOLN
3.0000 g | INTRAVENOUS | Status: AC
  Administered 2021-10-05 (×2): 3 g via INTRAVENOUS
  Filled 2021-10-04 (×2): qty 100

## 2021-10-04 MED ORDER — TRANEXAMIC ACID 1000 MG/10ML IV SOLN
1.5000 mg/kg/h | INTRAVENOUS | Status: AC
  Administered 2021-10-05: 09:00:00 1.5 mg/kg/h via INTRAVENOUS
  Filled 2021-10-04: qty 25

## 2021-10-04 MED ORDER — MILRINONE LACTATE IN DEXTROSE 20-5 MG/100ML-% IV SOLN
0.3000 ug/kg/min | INTRAVENOUS | Status: AC
  Administered 2021-10-05: 13:00:00 .25 ug/kg/min via INTRAVENOUS
  Filled 2021-10-04: qty 100

## 2021-10-04 MED ORDER — NITROGLYCERIN IN D5W 200-5 MCG/ML-% IV SOLN
2.0000 ug/min | INTRAVENOUS | Status: DC
Start: 2021-10-05 — End: 2021-10-05
  Filled 2021-10-04: qty 250

## 2021-10-04 MED ORDER — PLASMA-LYTE A IV SOLN
INTRAVENOUS | Status: DC
  Filled 2021-10-04: qty 5

## 2021-10-04 MED ORDER — TRANEXAMIC ACID (OHS) BOLUS VIA INFUSION
15.0000 mg/kg | INTRAVENOUS | Status: AC
  Administered 2021-10-05: 08:00:00 1129.5 mg via INTRAVENOUS
  Filled 2021-10-04: qty 1130

## 2021-10-04 MED ORDER — NOREPINEPHRINE 4 MG/250ML-% IV SOLN
0.0000 ug/min | INTRAVENOUS | Status: AC
  Administered 2021-10-05: 10:00:00 2 ug/min via INTRAVENOUS
  Administered 2021-10-05: 11:00:00 5 ug/min via INTRAVENOUS
  Filled 2021-10-04: qty 250

## 2021-10-04 MED ORDER — MANNITOL 20 % IV SOLN
INTRAVENOUS | Status: DC
  Filled 2021-10-04: qty 13

## 2021-10-04 MED ORDER — HEPARIN 30,000 UNITS/1000 ML (OHS) CELLSAVER SOLUTION
Status: DC
  Filled 2021-10-04: qty 1000

## 2021-10-04 MED ORDER — CEFAZOLIN SODIUM-DEXTROSE 2-4 GM/100ML-% IV SOLN
2.0000 g | INTRAVENOUS | Status: DC
  Filled 2021-10-04: qty 100

## 2021-10-04 MED ORDER — POTASSIUM CHLORIDE 2 MEQ/ML IV SOLN
80.0000 meq | INTRAVENOUS | Status: DC
  Filled 2021-10-04: qty 40

## 2021-10-04 MED ORDER — INSULIN REGULAR(HUMAN) IN NACL 100-0.9 UT/100ML-% IV SOLN
INTRAVENOUS | Status: AC
  Administered 2021-10-05: 09:00:00 3.6 [IU]/h via INTRAVENOUS
  Filled 2021-10-04: qty 100

## 2021-10-04 MED ORDER — PHENYLEPHRINE HCL-NACL 20-0.9 MG/250ML-% IV SOLN
30.0000 ug/min | INTRAVENOUS | Status: DC
  Filled 2021-10-04: qty 250

## 2021-10-04 MED ORDER — TRANEXAMIC ACID (OHS) PUMP PRIME SOLUTION
2.0000 mg/kg | INTRAVENOUS | Status: DC
  Filled 2021-10-04: qty 2.66

## 2021-10-04 NOTE — Anesthesia Preprocedure Evaluation (Addendum)
Anesthesia Evaluation  Patient identified by MRN, date of birth, ID band Patient awake    Reviewed: Allergy & Precautions, NPO status , Patient's Chart, lab work & pertinent test results, reviewed documented beta blocker date and time   History of Anesthesia Complications (+) DIFFICULT AIRWAY and history of anesthetic complications  Airway Mallampati: IV  TM Distance: >3 FB Neck ROM: Full    Dental  (+) Teeth Intact, Dental Advisory Given   Pulmonary sleep apnea , former smoker,    Pulmonary exam normal breath sounds clear to auscultation       Cardiovascular hypertension, Pt. on medications and Pt. on home beta blockers + CAD  + dysrhythmias Supra Ventricular Tachycardia + Valvular Problems/Murmurs AS  Rhythm:Regular Rate:Normal + Systolic murmurs Echo 04/02/93: 1. Left ventricular ejection fraction, by estimation, is 60 to 65%. The  left ventricle has normal function. The left ventricle has no regional  wall motion abnormalities. There is moderate left ventricular hypertrophy.  Left ventricular diastolic  parameters are consistent with Grade II diastolic dysfunction  (pseudonormalization). Elevated left ventricular end-diastolic pressure.  2. Right ventricular systolic function is normal. The right ventricular  size is normal. There is moderately elevated pulmonary artery systolic  pressure.  3. Left atrial size was moderately dilated.  4. The mitral valve is abnormal. Mild mitral valve regurgitation.  Moderate to severe mitral annular calcification.  5. The aortic valve is tricuspid. There is moderate calcification of the  aortic valve. Aortic valve regurgitation is trivial. Moderate to severe  aortic valve stenosis. Aortic valve area, by VTI measures 1.09 cm. Aortic  valve mean gradient measures 44.4  mmHg. Aortic valve Vmax measures 3.99 m/s. DI is 0.29 - based on LVOT  diameter of 2.2 cm.  6. Aortic dilatation  noted. There is mild dilatation of the ascending  aorta, measuring 42 mm.  7. The inferior vena cava is dilated in size with >50% respiratory  variability, suggesting right atrial pressure of 8 mmHg.   Neuro/Psych  Headaches,  Neuromuscular disease    GI/Hepatic negative GI ROS, Neg liver ROS,   Endo/Other  diabetes, Type 2, Insulin DependentHypothyroidism   Renal/GU ESRFRenal disease (s/p kidney transplant)     Musculoskeletal negative musculoskeletal ROS (+)   Abdominal   Peds  Hematology negative hematology ROS (+)   Anesthesia Other Findings   Reproductive/Obstetrics                          Anesthesia Physical Anesthesia Plan  ASA: 4  Anesthesia Plan: General   Post-op Pain Management:    Induction: Intravenous  PONV Risk Score and Plan: 2 and Midazolam and Treatment may vary due to age or medical condition  Airway Management Planned: Oral ETT and Video Laryngoscope Planned  Additional Equipment: Arterial line, CVP, PA Cath, TEE, 3D TEE and Ultrasound Guidance Line Placement  Intra-op Plan:   Post-operative Plan: Post-operative intubation/ventilation  Informed Consent: I have reviewed the patients History and Physical, chart, labs and discussed the procedure including the risks, benefits and alternatives for the proposed anesthesia with the patient or authorized representative who has indicated his/her understanding and acceptance.     Dental advisory given  Plan Discussed with: CRNA  Anesthesia Plan Comments: (PAT note written 10/04/2021 by Myra Gianotti, PA-C. )      Anesthesia Quick Evaluation

## 2021-10-04 NOTE — H&P (Signed)
[address]       [place] [address]             [phone]      Cardiothoracic Surgery Admission History and Physical   PCP is [name], [name], MD Referring Provider is Troy Sine, MD       Chief Complaint  Patient presents with   Coronary Artery Disease        Aortic Stenosis      HPI:   The patient is a 60 gentleman with history of hypertension, hyperlipidemia, type 2 diabetes, stage IIIb chronic kidney disease status post nephrectomy at age 44 for congenital nonfunctioning left kidney and subsequent renal transplant in 2018, OSA on CPAP, morbid obesity, PSVT status post ablation in 2018, severe aortic stenosis, and coronary artery disease who was referred for consideration of surgical treatment.  He is here today with his wife.  He reports a several month history of progressive exertional shortness of breath.  He has also been having intermittent upper chest discomfort radiating into his neck and shoulders associated with diaphoresis and nausea.  He denies any dizziness although he did have an episode of vertigo and fell off a ladder.  He did not have any loss of consciousness or head trauma but did injure his right leg and left shoulder.  He had a skin abrasion of his right lower extremity which developed some cellulitis and he was treated with antibiotics.  He denies orthopnea and PND.  He has had some lower extremity edema.   His last echocardiogram on 05/02/2021 showed a trileaflet aortic valve with moderate calcification and thickening with restricted leaflet mobility.  The mean gradient was 44 mmHg with trivial insufficiency.  There was mild enlargement of the ascending aorta at 4.2 cm.  There was moderate to severe mitral annular calcification with mild mitral regurgitation.  Pulmonary pressure was moderately elevated with normal right ventricular function.  Left ventricular ejection fraction was 60 to 65% with moderate LVH and grade 2 diastolic  dysfunction.  His previous echocardiogram February 2022 showed a mean gradient of 35 mm.  Cardiac catheterization on 08/22/2021 showed an 80% ostial proximal LAD stenosis followed by a 90% proximal to mid LAD stenosis.  Left circumflex had a large second marginal that 50% stenosis.  The RCA had about 80% mid PDA stenosis which was a moderate size vessel and an 80% lesion in the AV groove portion before a few small posterolateral branches.  Right heart catheterization showed a PA pressure of 41/11 with a mean right atrial pressure of 4.  Mean wedge pressure was 15 2015.  Cardiac index 2.9.  The mean aortic valve gradient was 40 mmHg peak to peak gradient of 42 mmHg.  Aortic valve area was 0.95 cm.  LVEDP was 22.   The patient is married and lives at home with his wife.  He works from home.  He has never smoked cigarettes.     Past Medical History:  Diagnosis Date   Anemia      low hgb at present   Arthritis      FEET   Blood transfusion without reported diagnosis 1974    kidney removed - L   Cancer ([facility])      skin ca right shoulder, plastic dsyplasia, pre-Ca polpys removed on Colonoscopy- 07/2014   Colon polyps 07/23/2014    Tubular adenomas x 5   Constipation     Diabetes mellitus without complication ([facility])  Type 2   Difficult intubation      unsure of actual problem but it was during the January 28, 2015 procedure.   Dysrhythmia     Eczema     ESRD (end stage renal disease) (HCC)      hEMO mwf   Headache(784.0)      slight dull h/a due kidney failure   Hyperlipidemia     Hypertension      SVT h/o , followed by Dr. Claiborne Billings   Hypothyroidism     Macular degeneration     Neuropathy      right hand and both feet   PAT (paroxysmal atrial tachycardia) (HCC)     PSVT (paroxysmal supraventricular tachycardia) (HCC)     SBO (small bowel obstruction) (HCC)     Shortness of breath      with exertion   Sleep apnea 06/18/11    split-night sleep study- [facility]  BiPAP- 13-16   Thyroid disease     Umbilical hernia      will repair with thi surgery           Past Surgical History:  Procedure Laterality Date   AV FISTULA PLACEMENT Right 02/25/2015    Procedure: RIGHT ARTERIOVENOUS (AV) FISTULA CREATION;  Surgeon: Serafina Mitchell, MD;  Location: [place];  Service: Vascular;  Laterality: Right;   AV FISTULA PLACEMENT Right 09/15/2015    Procedure: ARTERIOVENOUS (AV) FISTULA CREATION- RIGHT ARM;  Surgeon: Serafina Mitchell, MD;  Location: [place];  Service: Vascular;  Laterality: Right;   CAPD INSERTION N/A 05/18/2014    Procedure: LAPAROSCOPIC INSERTION CONTINUOUS AMBULATORY PERITONEAL DIALYSIS  (CAPD) CATHETER;  Surgeon: Ralene Ok, MD;  Location: [place];  Service: General;  Laterality: N/A;   COLONOSCOPY       COLONOSCOPY WITH PROPOFOL N/A 07/12/2016    Procedure: COLONOSCOPY WITH PROPOFOL;  Surgeon: [name], MD;  [facility];  Service: Endoscopy;  Laterality: N/A;   declotting of fistula   08/2015   ESOPHAGOGASTRODUODENOSCOPY (EGD) WITH PROPOFOL N/A 07/12/2016    Procedure: ESOPHAGOGASTRODUODENOSCOPY (EGD) WITH PROPOFOL;  Surgeon: [name], MD;  [facility];  Service: Endoscopy;  Laterality: N/A;   FISTULA SUPERFICIALIZATION Right 02/07/2016    Procedure: RIIGHT UPPER ARM FISTULA SUPERFICIALIZATION;  Surgeon: Elam Dutch, MD;  Location: [place];  Service: Vascular;  Laterality: Right;   IJ catheter insertion       INSERTION OF DIALYSIS CATHETER Right 02/25/2015    Procedure: INSERTION OF RIGHT INTERNAL JUGULAR DIALYSIS CATHETER;  Surgeon: Serafina Mitchell, MD;  Location: [facility];  Service: Vascular;  Laterality: Right;   KIDNEY TRANSPLANT       LAPAROSCOPIC REPOSITIONING CAPD CATHETER N/A 06/16/2014    Procedure: LAPAROSCOPIC REPOSITIONING CAPD CATHETER;  Surgeon: Ralene Ok, MD;  Location: [place];  Service: General;  Laterality: N/A;   LAPAROSCOPY N/A 05/04/2015    Procedure: LAPAROSCOPY DIAGNOSTIC LYSIS OF ADHESIONS;   Surgeon: Coralie Keens, MD;  Location: [facility];  Service: General;  Laterality: N/A;   MINOR REMOVAL OF PERITONEAL DIALYSIS CATHETER N/A 04/28/2015    Procedure:  REMOVAL OF PERITONEAL DIALYSIS CATHETER;  Surgeon: Coralie Keens, MD;  Location: [facility];  Service: General;  Laterality: N/A;   NEPHRECTOMY Left 1974   RENAL BIOPSY Right 2012   REVISON OF ARTERIOVENOUS FISTULA Right 05/26/2015    Procedure: SUPERFICIALIZATION OF ARTERIOVENOUS FISTULA WITH SIDE BRANCH LIGATIONS;  Surgeon: Serafina Mitchell, MD;  Location: [place];  Service:  Vascular;  Laterality: Right;   RIGHT/LEFT HEART CATH AND CORONARY ANGIOGRAPHY N/A 08/22/2021    Procedure: RIGHT/LEFT HEART CATH AND CORONARY ANGIOGRAPHY;  Surgeon: Troy Sine, MD;  Location: [facility];  Service: Cardiovascular;  Laterality: N/A;   SKIN CANCER EXCISION        right shoulder   SVT ABLATION N/A 11/23/2016    Procedure: SVT Ablation;  Surgeon: Will Meredith Leeds, MD;  Location: [facility];  Service: Cardiovascular;  Laterality: N/A;   UMBILICAL HERNIA REPAIR N/A 05/18/2014    Procedure: HERNIA REPAIR UMBILICAL ADULT;  Surgeon: Ralene Ok, MD;  Location: [place];  Service: General;  Laterality: N/A;   UMBILICAL HERNIA REPAIR N/A 04/28/2015    Procedure: UMBILICAL HERNIA REPAIR WITH MESH;  Surgeon: Coralie Keens, MD;  Location: [place];  Service: General;  Laterality: N/A;           Family History  Problem Relation Age of Onset   Colon polyps Father     Colon cancer Neg Hx     Stomach cancer Neg Hx        Social History Social History         Tobacco Use   Smoking status: Former      Types: Cigars      Quit date: 12/06/2012      Years since quitting: 8.7   Smokeless tobacco: Never  Substance Use Topics   Alcohol use: Yes      Comment: once/month   Drug use: No            Current Outpatient Medications  Medication Sig Dispense Refill   ACCU-CHEK FASTCLIX LANCETS MISC Use lancets to check blood sugar 5 times  daily. 500 each 3   amoxicillin (AMOXIL) 500 MG capsule Take 2,000 mg by mouth See admin instructions. Take 4 capsules (2000 mg) by mouth 1 hour prior to dental appointments.       aspirin EC 81 MG tablet Take 81 mg by mouth daily.       B-D UF III MINI PEN NEEDLES 31G X 5 MM MISC USE 4 PER DAY TO INJECT INSULIN. 100 each 5   BD INSULIN SYRINGE U/F 31G X 5/16" 1 ML MISC USE 3 TIMES A DAY WITH HUMULIN R 90 each 2   belatacept (NULOJIX) 250 MG SOLR injection Inject 24.5 mLs into the vein every 28 (twenty-eight) days.        Blood Glucose Monitoring Suppl (ACCU-CHEK GUIDE) w/Device KIT 1 each by Does not apply route daily. Use meter to check blood sugar 5 times daily. 1 kit 0   Blood Glucose Monitoring Suppl (FIFTY50 GLUCOSE METER 2.0) w/Device KIT Use as directed       cholecalciferol (VITAMIN D3) 25 MCG (1000 UNIT) tablet Take 1,000 Units by mouth in the morning and at bedtime.       Continuous Blood Gluc Sensor (FREESTYLE LIBRE 3 SENSOR) MISC 1 each by Does not apply route every 14 (fourteen) days. Place 1 sensor on the skin every 14 days. Use to check glucose continuously 6 each 3   DULoxetine (CYMBALTA) 60 MG capsule Take 60 mg by mouth daily.       fluocinonide cream (LIDEX) 6.81 % Apply 1 application topically daily as needed (Skin inflammation (hand, thigh, back)).        furosemide (LASIX) 20 MG tablet Take 20 mg by mouth daily as needed for fluid.       gabapentin (NEURONTIN) 100 MG capsule  Take 200-300 mg by mouth See admin instructions. Take 2 capsules (200 mg) by mouth in the morning and take 3 capsules (300 mg) by mouth at bedtime       glucose blood (ACCU-CHEK GUIDE) test strip 1 each by Other route as needed for other. Use as instructed to check blood sugar 5 times daily. 500 each 3   HUMULIN R 500 UNIT/ML injection USE MAX 200 UNITS IN PUMP EVERY THREE DAYS 60 mL 2   insulin aspart (NOVOLOG FLEXPEN) 100 UNIT/ML FlexPen Inject 5-20 Units into the skin 3 (three) times daily before meals.        Insulin Syringe-Needle U-100 (BD INSULIN SYRINGE ULTRAFINE) 31G X 5/16" 0.3 ML MISC Use to inject insulin 2-3 times per day 300 each 1   lactulose (CHRONULAC) 10 GM/15ML solution Take 10-20 g by mouth daily as needed (constipation).       Lancets Misc. (ACCU-CHEK FASTCLIX LANCET) KIT 1 each by Does not apply route daily. 1 kit 0   levothyroxine (SYNTHROID) 100 MCG tablet TAKE 1 TABLET BY MOUTH EVERY DAY 90 tablet 3   lisinopril (ZESTRIL) 20 MG tablet Take 20 mg by mouth 2 (two) times daily.       metoprolol tartrate (LOPRESSOR) 50 MG tablet Take 1 tablet (50 mg total) by mouth 2 (two) times daily. 180 tablet 3   mycophenolate (CELLCEPT) 250 MG capsule Take 1,000 mg by mouth every 12 (twelve) hours.        NEEDLE, DISP, 25 G (B-D DISP NEEDLE 25GX1") 25G X 1" MISC USE TO INJECT TESTOSTERONE (EXCHANGE NEEDLE FROM 22G 3ML SYRINGE) 200 each 2   predniSONE (DELTASONE) 5 MG tablet Take 5 mg by mouth daily with breakfast.       rosuvastatin (CRESTOR) 20 MG tablet Take 20 mg by mouth at bedtime.       senna-docusate (SENOKOT-S) 8.6-50 MG tablet Take 1 tablet by mouth as needed for mild constipation.       Syringe, Disposable, (SYRINGE LUER LOCK) 3 ML MISC Use 1 syringe once weekly to inject Testosterone medication. 100 each 1   tamsulosin (FLOMAX) 0.4 MG CAPS capsule Take 0.8 mg by mouth at bedtime.        testosterone cypionate (DEPOTESTOSTERONE CYPIONATE) 200 MG/ML injection Inject 0.6 mLs (120 mg total) into the muscle every 7 (seven) days. 10 mL 1   traMADol (ULTRAM) 50 MG tablet Take 1 tablet (50 mg total) by mouth every 6 (six) hours as needed. (Patient taking differently: Take 150 mg by mouth in the morning and at bedtime.) 15 tablet 0   traZODone (DESYREL) 50 MG tablet Take 50-100 mg by mouth at bedtime as needed for sleep.        UNABLE TO FIND BiPAP: At bedtime       zolpidem (AMBIEN) 10 MG tablet Take 10 mg by mouth at bedtime as needed for sleep.       amLODipine (NORVASC) 10 MG tablet  TAKE 1 TABLET BY MOUTH EVERY DAY 90 tablet 1    No current facility-administered medications for this visit.      No Known Allergies   Review of Systems  Constitutional:  Positive for activity change, diaphoresis and fatigue. Negative for appetite change, chills and fever.  HENT:  Negative for dental problem.        Sees his dentist regularly  Eyes: Negative.   Respiratory:  Positive for apnea and shortness of breath.   Cardiovascular:  Positive for leg swelling. Negative for  chest pain and palpitations.  Endocrine: Negative.   Genitourinary:        Kidney transplant  Musculoskeletal: Negative.   Skin: Negative.   Allergic/Immunologic: Negative.   Neurological:  Positive for numbness. Negative for dizziness and syncope.       In hands and feet  Hematological: Negative.   Psychiatric/Behavioral:         Depression and anxiety    BP (!) 171/80 (BP Location: Left Arm, Patient Position: Sitting)    Pulse 98    Resp 20    Ht _0  (1.803 m)    Wt 295 lb (133.8 kg)    SpO2 94% Comment: RA   BMI 41.14 kg/m  Physical Exam Constitutional:      Appearance: Normal appearance. He is obese.  HENT:     Head: Normocephalic and atraumatic.  Eyes:     Extraocular Movements: Extraocular movements intact.     Conjunctiva/sclera: Conjunctivae normal.     Pupils: Pupils are equal, round, and reactive to light.  Neck:     Comments: Transmitted murmur or bruit both sides of the neck Cardiovascular:     Rate and Rhythm: Normal rate and regular rhythm.     Heart sounds: Murmur heard.     Comments: 3/6 systolic murmur loudest along the right sternal border Pulmonary:     Effort: Pulmonary effort is normal.     Breath sounds: Normal breath sounds.  Abdominal:     Tenderness: There is no abdominal tenderness.  Musculoskeletal:        General: No swelling or tenderness.     Cervical back: Normal range of motion and neck supple.  Skin:    General: Skin is warm and dry.  Neurological:      General: No focal deficit present.     Mental Status: He is alert and oriented to person, place, and time.  Psychiatric:        Mood and Affect: Mood normal.        Behavior: Behavior normal.        Diagnostic Tests:       ECHOCARDIOGRAM REPORT         Patient Name:   Robert [name] Date of Exam: 05/02/2021  Medical Rec #:  [id number]        Height:       71.0 in  Accession #:    [id number]       Weight:       299.6 lb  Date of Birth:  17-Aug-1961        BSA:          2.504 m  Patient Age:    69 years         BP:           148/94 mmHg  Patient Gender: M                HR:           73 bpm.  Exam Location:  [facility]   Procedure: 2D Echo, Cardiac Doppler and Color Doppler   Indications:    I35.0 Aortic Stenosis     History:        Patient has prior history of Echocardiogram examinations,  most                  recent 11/16/2020. Risk Factors:Diabetes, Hypertension and  Dyslipidemia. Sleep apnea. Hypothyroidism. PSVT. Shortness  of                  breath. End stage renal disease.     Sonographer:    Wilford Sports Rodgers-Jones RDCS  Referring Phys: [place]     1. Left ventricular ejection fraction, by estimation, is 60 to 65%. The  left ventricle has normal function. The left ventricle has no regional  wall motion abnormalities. There is moderate left ventricular hypertrophy.  Left ventricular diastolic  parameters are consistent with Grade II diastolic dysfunction  (pseudonormalization). Elevated left ventricular end-diastolic pressure.   2. Right ventricular systolic function is normal. The right ventricular  size is normal. There is moderately elevated pulmonary artery systolic  pressure.   3. Left atrial size was moderately dilated.   4. The mitral valve is abnormal. Mild mitral valve regurgitation.  Moderate to severe mitral annular calcification.   5. The aortic valve is tricuspid. There is moderate calcification of the   aortic valve. Aortic valve regurgitation is trivial. Moderate to severe  aortic valve stenosis. Aortic valve area, by VTI measures 1.09 cm. Aortic  valve mean gradient measures 44.4   mmHg. Aortic valve Vmax measures 3.99 m/s. DI is 0.29 - based on LVOT  diameter of 2.2 cm.   6. Aortic dilatation noted. There is mild dilatation of the ascending  aorta, measuring 42 mm.   7. The inferior vena cava is dilated in size with >50% respiratory  variability, suggesting right atrial pressure of 8 mmHg.   Comparison(s): Changes from prior study are noted. 11/16/2020: LVEF 65-70%,  moderate AS -mean gradient 34.6 mmHg.   FINDINGS   Left Ventricle: Left ventricular ejection fraction, by estimation, is 60  to 65%. The left ventricle has normal function. The left ventricle has no  regional wall motion abnormalities. The left ventricular internal cavity  size was normal in size. There is   moderate left ventricular hypertrophy. Left ventricular diastolic  parameters are consistent with Grade II diastolic dysfunction  (pseudonormalization). Elevated left ventricular end-diastolic pressure.   Right Ventricle: The right ventricular size is normal. No increase in  right ventricular wall thickness. Right ventricular systolic function is  normal. There is moderately elevated pulmonary artery systolic pressure.  The tricuspid regurgitant velocity is  3.06 m/s, and with an assumed right atrial pressure of 8 mmHg, the  estimated right ventricular systolic pressure is 26.7 mmHg.   Left Atrium: Left atrial size was moderately dilated.   Right Atrium: Right atrial size was normal in size.   Pericardium: There is no evidence of pericardial effusion.   Mitral Valve: The mitral valve is abnormal. There is mild thickening of  the mitral valve leaflet(s). There is mild calcification of the posterior  mitral valve leaflet(s). Moderate to severe mitral annular calcification.  Mild mitral valve regurgitation.    Tricuspid Valve: The tricuspid valve is grossly normal. Tricuspid valve  regurgitation is trivial.   Aortic Valve: The aortic valve is tricuspid. There is moderate  calcification of the aortic valve. Aortic valve regurgitation is trivial.  Moderate to severe aortic stenosis is present. Aortic valve mean gradient  measures 44.4 mmHg. Aortic valve peak  gradient measures 63.6 mmHg. Aortic valve area, by VTI measures 0.81 cm.   Pulmonic Valve: The pulmonic valve was normal in structure. Pulmonic valve  regurgitation is not visualized.   Aorta: Aortic dilatation noted. There is mild dilatation of the ascending  aorta, measuring  42 mm.   Venous: The inferior vena cava is dilated in size with greater than 50%  respiratory variability, suggesting right atrial pressure of 8 mmHg.   IAS/Shunts: No atrial level shunt detected by color flow Doppler.      LEFT VENTRICLE  PLAX 2D  LVIDd:         5.60 cm  Diastology  LVIDs:         3.90 cm  LV e' medial:    6.96 cm/s  LV PW:         1.30 cm  LV E/e' medial:  18.2  LV IVS:        1.30 cm  LV e' lateral:   11.40 cm/s  LVOT diam:     1.90 cm  LV E/e' lateral: 11.1  LV SV:         74  LV SV Index:   30  LVOT Area:     2.84 cm      RIGHT VENTRICLE             IVC  RV Basal diam:  5.40 cm     IVC diam: 2.30 cm  RV S prime:     16.40 cm/s  TAPSE (M-mode): 2.7 cm   LEFT ATRIUM             Index       RIGHT ATRIUM           Index  LA diam:        6.30 cm 2.52 cm/m  RA Area:     20.80 cm  LA Vol (A2C):   99.4 ml 39.70 ml/m RA Volume:   63.20 ml  25.24 ml/m  LA Vol (A4C):   97.8 ml 39.06 ml/m  LA Biplane Vol: 98.5 ml 39.34 ml/m   AORTIC VALVE  AV Area (Vmax):    0.85 cm  AV Area (Vmean):   0.75 cm  AV Area (VTI):     0.81 cm  AV Vmax:           398.80 cm/s  AV Vmean:          315.600 cm/s  AV VTI:            0.918 m  AV Peak Grad:      63.6 mmHg  AV Mean Grad:      44.4 mmHg  LVOT Vmax:         120.00 cm/s  LVOT Vmean:         83.850 cm/s  LVOT VTI:          0.262 m  LVOT/AV VTI ratio: 0.29     AORTA  Ao Root diam: 3.70 cm  Ao Asc diam:  4.20 cm   MITRAL VALVE                TRICUSPID VALVE  MV Area (PHT): 4.60 cm     TR Peak grad:   37.5 mmHg  MV Decel Time: 165 msec     TR Vmax:        306.00 cm/s  MV E velocity: 127.00 cm/s  MV A velocity: 73.40 cm/s   SHUNTS  MV E/A ratio:  1.73         Systemic VTI:  0.26 m                              Systemic Diam: 1.90 cm  Lyman Bishop MD  Electronically signed by Lyman Bishop MD  Signature Date/Time: 05/02/2021/3:22:28 PM         Final       Physicians   Panel Physicians Referring Physician Case Authorizing Physician  Troy Sine, MD (Primary)        Procedures   RIGHT/LEFT HEART CATH AND CORONARY ANGIOGRAPHY    Conclusion       2nd Mrg lesion is 50% stenosed.   Lat 2nd Mrg lesion is 20% stenosed.   RPAV lesion is 80% stenosed.   Dist LM to Prox LAD lesion is 80% stenosed.   Prox LAD to Mid LAD lesion is 90% stenosed.   LV end diastolic pressure is mildly elevated.       Recommendations   Antiplatelet/Anticoag Recommend Aspirin 18m daily for moderate CAD.    Indications   Aortic valve stenosis, etiology of cardiac valve disease unspecified [I35.0 (ICD-10-CM)]    Procedural Details   Technical Details Mr. AKire Fergis a 60year old gentleman who has a history of hypertension, hyperlipidemia, diabetes mellitus,  s/p nephrectomy at age [age]for congenital nonfunctioning left kidney and subsequent end-stage renal disease previously on dialysis with subsequent renal transplant 2018.over the years he has developed progressive aortic valve stenosis and his most recent echocardiogram in 04/2021 showed EF 60-65%, no R WMA, moderate LVH, G2 DD, moderate LAE, mild MR with moderate to severe MAC, moderate to severe aortic stenosis with mean gradient 44.4 mmHg and peak gradient 63.6 mmHg, and mild dilation of the ascending aorta (42 mm).  [facility]ines elevated from 1.69-2.2. He has developed increasing exertional dyspnea.  His most recent creatinine improved to 1.85.  Due to worsening exertional symptomatology he is now referred for right and left heart cardiac catheterization.  Mr. MReamypresented to the Cath Lab in the fasting state.  His right femoral region was prepped and draped in sterile fashion.  His right femoral artery was punctured anteriorly and a 5 French sheath was inserted.  His right femoral vein was punctured anteriorly and a 7 French sheath was inserted.  A 7 French Swan-Ganz catheter was advanced through the femoral sheath to the RA, RV, PA, pulmonary capillary wedge positions for hemodynamic monitoring.  Oxygen saturation was obtained x2 in the pulmonary artery.  Diagnostic cardiac catheterization was done with 5 French Judkins 4 left and right catheters.  Oxygen saturation was obtained in the central aorta.  Ultimately the aortic valve was successfully crossed with an AL-1 catheter and straight wire for left ventricular pressure recording.  Left ventriculography was not done. A careful LV to AO pullback was performed.  All catheters were removed from the patient.  Hemostasis was obtained by direct manual pressure.  The patient tolerated the procedure well and will return to short stay with plans for discharge later today. Estimated blood loss <50 mL.   During this procedure medications were administered to achieve and maintain moderate conscious sedation while the patient's heart rate, blood pressure, and oxygen saturation were continuously monitored and I was present face-to-face 100% of this time.    Medications (Filter: Administrations occurring from 1354 to 1526 on 08/22/21) fentaNYL (SUBLIMAZE) injection (mcg) Total dose:  75 mcg Date/Time Rate/Dose/Volume Action    08/22/21 1420 25 mcg Given    1426 25 mcg Given    1441 25 mcg Given       midazolam (VERSED) injection (mg) Total dose:  3 mg Date/Time Rate/Dose/Volume Action    08/22/21 1421 2 mg Given    1426 1 mg Given      lidocaine (PF) (XYLOCAINE) 1 % injection (mL) Total volume:  15 mL Date/Time Rate/Dose/Volume Action    08/22/21 1425 15 mL Given      iohexol (OMNIPAQUE) 350 MG/ML injection (mL) Total volume:  80 mL Date/Time Rate/Dose/Volume Action    08/22/21 1525 80 mL Given      Heparin (Porcine) in NaCl 1000-0.9 UT/500ML-% SOLN (mL) Total volume:  1,000 mL Date/Time Rate/Dose/Volume Action    08/22/21 1524 500 mL Given    1524 500 mL Given      Sedation Time   Sedation Time Physician-1:  Radiation/Fluoro   Fluoro time: 13.6 (min) DAP: 70026 (mGycm2) Cumulative Air Kerma: 873 (mGy) Coronary Findings   Diagnostic Dominance: Right Left Main  Dist LM to Prox LAD lesion is 80% stenosed.    Left Anterior Descending  Prox LAD to Mid LAD lesion is 90% stenosed.    Left Circumflex    First Obtuse Marginal Branch  Vessel is small in size.    Second Obtuse Marginal Branch  2nd Mrg lesion is 50% stenosed.    Lateral Second Obtuse Marginal Branch  Lat 2nd Mrg lesion is 20% stenosed.    Right Coronary Artery    Right Posterior Atrioventricular Artery  RPAV lesion is 80% stenosed.    Intervention    No interventions have been documented.    Right Heart   Right Atrium RA: A-wave 7, V wave 4; mean 4 RV: 41/6 PA: 41/11 PW: A-wave 17, V wave 15; mean 15.  Initial AO: 114/60          LV: 161/22  Pullback: LV: 162/23 AO: 120/62  By the Fick method, cardiac output 7.3 L/min with cardiac index 2.9 L/min/m.  PVR: 1.8WU  Mean aortic gradient 40 mmHg Peak to peak aortic gradient 42 mmHg AVA: 0.95 cm2    Left Heart   Left Ventricle LV end diastolic pressure is mildly elevated.    Coronary Diagrams   Diagnostic Dominance: Right Intervention   Implants      No implant documentation for this case.    Syngo Images     Show images for CARDIAC CATHETERIZATION Images on Long Term Storage    Show images for Holdyn, Poyser to Procedure Log   Procedure Log    Hemo Data   Flowsheet Row Most Recent Value  Fick Cardiac Output 7.26 L/min  Fick Cardiac Output Index 2.94 (L/min)/BSA  Aortic Mean Gradient 40.1 mmHg  Aortic Peak Gradient 42 mmHg  Aortic Valve Area 0.95  Aortic Value Area Index 0.39 cm2/BSA  RA A Wave 7 mmHg  RA V Wave 4 mmHg  RA Mean 4 mmHg  RV Systolic Pressure 41 mmHg  RV Diastolic Pressure 0 mmHg  RV EDP 6 mmHg  PA Systolic Pressure 41 mmHg  PA Diastolic Pressure 11 mmHg  PA Mean 28 mmHg  PW A Wave 17 mmHg  PW V Wave 15 mmHg  PW Mean 15 mmHg  AO Systolic Pressure 817 mmHg  AO Diastolic Pressure 60 mmHg  AO Mean 80 mmHg  LV Systolic Pressure 711 mmHg  LV Diastolic Pressure -7 mmHg  LV EDP 22 mmHg  AOp Systolic Pressure 657 mmHg  AOp Diastolic Pressure 64 mmHg  AOp Mean Pressure 88 mmHg  LVp Systolic Pressure 903 mmHg  LVp Diastolic Pressure 0 mmHg  LVp EDP Pressure 23 mmHg  QP/QS 1  TPVR Index 9.51 HRUI  TSVR Index 27.17 HRUI  PVR SVR Ratio 0.17  TPVR/TSVR Ratio 0.35        Impression:   Is 60 year old gentleman has stage D, severe, symptomatic aortic stenosis with New York Heart Association class III symptoms due to shortness of breath consistent with chronic diastolic congestive heart failure.  His cardiac catheterization shows severe three-vessel coronary disease 80% of the 90% proximal LAD stenosis, 50% marginal stenosis, and 80% PDA and PL AV groove stenoses.  He has had some exertional discomfort of the chest radiating to the neck and shoulders on both sides.  I agree that aortic valve replacement using a bioprosthetic valve and coronary artery bypass graft surgery is going to be the best treatment to this patient.  His coronary disease is not ideal for PCI especially with calcified ostial and proximal LAD stenoses.  He is also relatively young for a  transcatheter aortic valve replacement.  I think his symptoms are likely related to the combination of severe aortic stenosis and significant coronary disease and both problems that are to be treated.  His operative risk is certainly increased due to his morbid obesity and multiple comorbidities including poorly controlled diabetes and stage IIIb chronic kidney disease status post renal transplantation.he certainly could have worsening of his kidney function.  I discussed the operative procedure with the patient and his wife including alternatives, benefits and risks; including but not limited to bleeding, blood transfusion, infection, stroke, myocardial infarction, graft failure, heart block requiring a permanent pacemaker, organ dysfunction, and death.  Matilde Bash understands and agrees to proceed.       Plan:   Aortic valve replacement using a bioprosthetic valve and coronary bypass graft surgery.     [name], MD [facility] [phone]

## 2021-10-05 ENCOUNTER — Inpatient Hospital Stay (HOSPITAL_COMMUNITY): Payer: 59

## 2021-10-05 ENCOUNTER — Inpatient Hospital Stay (HOSPITAL_COMMUNITY)
Admission: RE | Admit: 2021-10-05 | Discharge: 2021-10-18 | DRG: 219 | Disposition: A | Discharge status: Skilled Nursing Facility | Payer: 59 | Attending: Surgery | Admitting: Surgery

## 2021-10-05 ENCOUNTER — Inpatient Hospital Stay (HOSPITAL_COMMUNITY): Payer: 59 | Admitting: Anesthesiology

## 2021-10-05 ENCOUNTER — Inpatient Hospital Stay (HOSPITAL_COMMUNITY): Payer: 59 | Admitting: Vascular Surgery

## 2021-10-05 ENCOUNTER — Inpatient Hospital Stay (HOSPITAL_COMMUNITY)
Admission: RE | Disposition: A | Discharge status: Skilled Nursing Facility | Payer: Self-pay | Source: Home / Self Care | Attending: Surgery

## 2021-10-05 ENCOUNTER — Other Ambulatory Visit: Payer: Self-pay

## 2021-10-05 ENCOUNTER — Encounter (HOSPITAL_COMMUNITY): Payer: Self-pay | Admitting: Surgery

## 2021-10-05 DIAGNOSIS — E1140 Type 2 diabetes mellitus with diabetic neuropathy, unspecified: Secondary | ICD-10-CM | POA: Diagnosis present

## 2021-10-05 DIAGNOSIS — Z952 Presence of prosthetic heart valve: Secondary | ICD-10-CM

## 2021-10-05 DIAGNOSIS — M19071 Primary osteoarthritis, right ankle and foot: Secondary | ICD-10-CM | POA: Diagnosis present

## 2021-10-05 DIAGNOSIS — I6611 Occlusion and stenosis of right anterior cerebral artery: Secondary | ICD-10-CM | POA: Diagnosis present

## 2021-10-05 DIAGNOSIS — Y830 Surgical operation with transplant of whole organ as the cause of abnormal reaction of the patient, or of later complication, without mention of misadventure at the time of the procedure: Secondary | ICD-10-CM | POA: Diagnosis present

## 2021-10-05 DIAGNOSIS — D62 Acute posthemorrhagic anemia: Secondary | ICD-10-CM | POA: Diagnosis not present

## 2021-10-05 DIAGNOSIS — Z8673 Personal history of transient ischemic attack (TIA), and cerebral infarction without residual deficits: Secondary | ICD-10-CM

## 2021-10-05 DIAGNOSIS — E039 Hypothyroidism, unspecified: Secondary | ICD-10-CM | POA: Diagnosis present

## 2021-10-05 DIAGNOSIS — Z940 Kidney transplant status: Secondary | ICD-10-CM

## 2021-10-05 DIAGNOSIS — E1122 Type 2 diabetes mellitus with diabetic chronic kidney disease: Secondary | ICD-10-CM | POA: Diagnosis present

## 2021-10-05 DIAGNOSIS — Z7982 Long term (current) use of aspirin: Secondary | ICD-10-CM

## 2021-10-05 DIAGNOSIS — Z20822 Contact with and (suspected) exposure to covid-19: Secondary | ICD-10-CM | POA: Diagnosis present

## 2021-10-05 DIAGNOSIS — I6501 Occlusion and stenosis of right vertebral artery: Secondary | ICD-10-CM | POA: Diagnosis present

## 2021-10-05 DIAGNOSIS — E876 Hypokalemia: Secondary | ICD-10-CM | POA: Diagnosis not present

## 2021-10-05 DIAGNOSIS — Z794 Long term (current) use of insulin: Secondary | ICD-10-CM

## 2021-10-05 DIAGNOSIS — I350 Nonrheumatic aortic (valve) stenosis: Principal | ICD-10-CM | POA: Diagnosis present

## 2021-10-05 DIAGNOSIS — T8119XA Other postprocedural shock, initial encounter: Secondary | ICD-10-CM | POA: Diagnosis not present

## 2021-10-05 DIAGNOSIS — I129 Hypertensive chronic kidney disease with stage 1 through stage 4 chronic kidney disease, or unspecified chronic kidney disease: Secondary | ICD-10-CM | POA: Diagnosis present

## 2021-10-05 DIAGNOSIS — J939 Pneumothorax, unspecified: Secondary | ICD-10-CM

## 2021-10-05 DIAGNOSIS — T8619 Other complication of kidney transplant: Secondary | ICD-10-CM | POA: Diagnosis present

## 2021-10-05 DIAGNOSIS — I4891 Unspecified atrial fibrillation: Secondary | ICD-10-CM | POA: Diagnosis not present

## 2021-10-05 DIAGNOSIS — I5032 Chronic diastolic (congestive) heart failure: Secondary | ICD-10-CM | POA: Diagnosis present

## 2021-10-05 DIAGNOSIS — Z905 Acquired absence of kidney: Secondary | ICD-10-CM

## 2021-10-05 DIAGNOSIS — N1832 Chronic kidney disease, stage 3b: Secondary | ICD-10-CM | POA: Diagnosis present

## 2021-10-05 DIAGNOSIS — I25119 Atherosclerotic heart disease of native coronary artery with unspecified angina pectoris: Secondary | ICD-10-CM

## 2021-10-05 DIAGNOSIS — I6310 Cerebral infarction due to embolism of unspecified precerebral artery: Secondary | ICD-10-CM | POA: Diagnosis not present

## 2021-10-05 DIAGNOSIS — E785 Hyperlipidemia, unspecified: Secondary | ICD-10-CM | POA: Diagnosis present

## 2021-10-05 DIAGNOSIS — Z6841 Body Mass Index (BMI) 40.0 and over, adult: Secondary | ICD-10-CM | POA: Diagnosis not present

## 2021-10-05 DIAGNOSIS — Z79890 Hormone replacement therapy (postmenopausal): Secondary | ICD-10-CM

## 2021-10-05 DIAGNOSIS — F419 Anxiety disorder, unspecified: Secondary | ICD-10-CM | POA: Diagnosis present

## 2021-10-05 DIAGNOSIS — M19072 Primary osteoarthritis, left ankle and foot: Secondary | ICD-10-CM | POA: Diagnosis present

## 2021-10-05 DIAGNOSIS — I2510 Atherosclerotic heart disease of native coronary artery without angina pectoris: Secondary | ICD-10-CM | POA: Diagnosis present

## 2021-10-05 DIAGNOSIS — Z7952 Long term (current) use of systemic steroids: Secondary | ICD-10-CM

## 2021-10-05 DIAGNOSIS — I77810 Thoracic aortic ectasia: Secondary | ICD-10-CM | POA: Diagnosis present

## 2021-10-05 DIAGNOSIS — Z09 Encounter for follow-up examination after completed treatment for conditions other than malignant neoplasm: Secondary | ICD-10-CM

## 2021-10-05 DIAGNOSIS — G4733 Obstructive sleep apnea (adult) (pediatric): Secondary | ICD-10-CM | POA: Diagnosis present

## 2021-10-05 DIAGNOSIS — Z85828 Personal history of other malignant neoplasm of skin: Secondary | ICD-10-CM

## 2021-10-05 DIAGNOSIS — Z87891 Personal history of nicotine dependence: Secondary | ICD-10-CM

## 2021-10-05 DIAGNOSIS — Z951 Presence of aortocoronary bypass graft: Secondary | ICD-10-CM

## 2021-10-05 DIAGNOSIS — K5900 Constipation, unspecified: Secondary | ICD-10-CM | POA: Diagnosis not present

## 2021-10-05 DIAGNOSIS — M25551 Pain in right hip: Secondary | ICD-10-CM | POA: Diagnosis not present

## 2021-10-05 DIAGNOSIS — I639 Cerebral infarction, unspecified: Secondary | ICD-10-CM | POA: Diagnosis not present

## 2021-10-05 DIAGNOSIS — Z79899 Other long term (current) drug therapy: Secondary | ICD-10-CM

## 2021-10-05 DIAGNOSIS — N189 Chronic kidney disease, unspecified: Secondary | ICD-10-CM

## 2021-10-05 DIAGNOSIS — Z0189 Encounter for other specified special examinations: Secondary | ICD-10-CM

## 2021-10-05 DIAGNOSIS — Z978 Presence of other specified devices: Secondary | ICD-10-CM

## 2021-10-05 DIAGNOSIS — H3530 Unspecified macular degeneration: Secondary | ICD-10-CM | POA: Diagnosis present

## 2021-10-05 DIAGNOSIS — E1165 Type 2 diabetes mellitus with hyperglycemia: Secondary | ICD-10-CM | POA: Diagnosis present

## 2021-10-05 DIAGNOSIS — N170 Acute kidney failure with tubular necrosis: Secondary | ICD-10-CM | POA: Diagnosis present

## 2021-10-05 HISTORY — DX: Presence of prosthetic heart valve: Z95.2

## 2021-10-05 HISTORY — PX: CORONARY ARTERY BYPASS GRAFT: SHX141

## 2021-10-05 HISTORY — PX: TEE WITHOUT CARDIOVERSION: SHX5443

## 2021-10-05 HISTORY — PX: AORTIC VALVE REPLACEMENT: SHX41

## 2021-10-05 LAB — GLUCOSE, CAPILLARY
Glucose-Capillary: 149 mg/dL — ABNORMAL HIGH (ref 70–99)
Glucose-Capillary: 151 mg/dL — ABNORMAL HIGH (ref 70–99)
Glucose-Capillary: 153 mg/dL — ABNORMAL HIGH (ref 70–99)
Glucose-Capillary: 154 mg/dL — ABNORMAL HIGH (ref 70–99)
Glucose-Capillary: 165 mg/dL — ABNORMAL HIGH (ref 70–99)
Glucose-Capillary: 167 mg/dL — ABNORMAL HIGH (ref 70–99)
Glucose-Capillary: 168 mg/dL — ABNORMAL HIGH (ref 70–99)
Glucose-Capillary: 169 mg/dL — ABNORMAL HIGH (ref 70–99)
Glucose-Capillary: 171 mg/dL — ABNORMAL HIGH (ref 70–99)
Glucose-Capillary: 177 mg/dL — ABNORMAL HIGH (ref 70–99)

## 2021-10-05 LAB — POCT I-STAT 7, (LYTES, BLD GAS, ICA,H+H)
Acid-base deficit: 3 mmol/L — ABNORMAL HIGH (ref 0.0–2.0)
Acid-base deficit: 1 mmol/L (ref 0.0–2.0)
Acid-base deficit: 2 mmol/L (ref 0.0–2.0)
Acid-base deficit: 3 mmol/L — ABNORMAL HIGH (ref 0.0–2.0)
Acid-base deficit: 3 mmol/L — ABNORMAL HIGH (ref 0.0–2.0)
Acid-base deficit: 7 mmol/L — ABNORMAL HIGH (ref 0.0–2.0)
Acid-base deficit: 7 mmol/L — ABNORMAL HIGH (ref 0.0–2.0)
Acid-base deficit: 6 mmol/L — ABNORMAL HIGH (ref 0.0–2.0)
Acid-base deficit: 7 mmol/L — ABNORMAL HIGH (ref 0.0–2.0)
Acid-base deficit: 4 mmol/L — ABNORMAL HIGH (ref 0.0–2.0)
Bicarbonate: 23.3 mmol/L (ref 20.0–28.0)
Bicarbonate: 25.8 mmol/L (ref 20.0–28.0)
Bicarbonate: 23.2 mmol/L (ref 20.0–28.0)
Bicarbonate: 22.6 mmol/L (ref 20.0–28.0)
Bicarbonate: 22.1 mmol/L (ref 20.0–28.0)
Bicarbonate: 19.9 mmol/L — ABNORMAL LOW (ref 20.0–28.0)
Bicarbonate: 20.3 mmol/L (ref 20.0–28.0)
Bicarbonate: 20.1 mmol/L (ref 20.0–28.0)
Bicarbonate: 18.8 mmol/L — ABNORMAL LOW (ref 20.0–28.0)
Bicarbonate: 21.3 mmol/L (ref 20.0–28.0)
Calcium, Ion: 1.29 mmol/L (ref 1.15–1.40)
Calcium, Ion: 1.16 mmol/L (ref 1.15–1.40)
Calcium, Ion: 1.16 mmol/L (ref 1.15–1.40)
Calcium, Ion: 1.15 mmol/L (ref 1.15–1.40)
Calcium, Ion: 1.13 mmol/L — ABNORMAL LOW (ref 1.15–1.40)
Calcium, Ion: 1.17 mmol/L (ref 1.15–1.40)
Calcium, Ion: 1.16 mmol/L (ref 1.15–1.40)
Calcium, Ion: 1.12 mmol/L — ABNORMAL LOW (ref 1.15–1.40)
Calcium, Ion: 1.11 mmol/L — ABNORMAL LOW (ref 1.15–1.40)
Calcium, Ion: 1.12 mmol/L — ABNORMAL LOW (ref 1.15–1.40)
HCT: 42 % (ref 39.0–52.0)
HCT: 33 % — ABNORMAL LOW (ref 39.0–52.0)
HCT: 33 % — ABNORMAL LOW (ref 39.0–52.0)
HCT: 32 % — ABNORMAL LOW (ref 39.0–52.0)
HCT: 34 % — ABNORMAL LOW (ref 39.0–52.0)
HCT: 39 % (ref 39.0–52.0)
HCT: 38 % — ABNORMAL LOW (ref 39.0–52.0)
HCT: 36 % — ABNORMAL LOW (ref 39.0–52.0)
HCT: 36 % — ABNORMAL LOW (ref 39.0–52.0)
HCT: 36 % — ABNORMAL LOW (ref 39.0–52.0)
Hemoglobin: 14.3 g/dL (ref 13.0–17.0)
Hemoglobin: 11.2 g/dL — ABNORMAL LOW (ref 13.0–17.0)
Hemoglobin: 11.2 g/dL — ABNORMAL LOW (ref 13.0–17.0)
Hemoglobin: 10.9 g/dL — ABNORMAL LOW (ref 13.0–17.0)
Hemoglobin: 11.6 g/dL — ABNORMAL LOW (ref 13.0–17.0)
Hemoglobin: 13.3 g/dL (ref 13.0–17.0)
Hemoglobin: 12.9 g/dL — ABNORMAL LOW (ref 13.0–17.0)
Hemoglobin: 12.2 g/dL — ABNORMAL LOW (ref 13.0–17.0)
Hemoglobin: 12.2 g/dL — ABNORMAL LOW (ref 13.0–17.0)
Hemoglobin: 12.2 g/dL — ABNORMAL LOW (ref 13.0–17.0)
O2 Saturation: 96 %
O2 Saturation: 100 %
O2 Saturation: 100 %
O2 Saturation: 100 %
O2 Saturation: 95 %
O2 Saturation: 91 %
O2 Saturation: 98 %
O2 Saturation: 100 %
O2 Saturation: 100 %
O2 Saturation: 100 %
Patient temperature: 36.2
Patient temperature: 36.1
Patient temperature: 36
Patient temperature: 36.2
Patient temperature: 36.9
Potassium: 4.1 mmol/L (ref 3.5–5.1)
Potassium: 4.9 mmol/L (ref 3.5–5.1)
Potassium: 5.4 mmol/L — ABNORMAL HIGH (ref 3.5–5.1)
Potassium: 6.2 mmol/L — ABNORMAL HIGH (ref 3.5–5.1)
Potassium: 5.7 mmol/L — ABNORMAL HIGH (ref 3.5–5.1)
Potassium: 4.2 mmol/L (ref 3.5–5.1)
Potassium: 3.9 mmol/L (ref 3.5–5.1)
Potassium: 3.9 mmol/L (ref 3.5–5.1)
Potassium: 3.9 mmol/L (ref 3.5–5.1)
Potassium: 4 mmol/L (ref 3.5–5.1)
Sodium: 138 mmol/L (ref 135–145)
Sodium: 137 mmol/L (ref 135–145)
Sodium: 135 mmol/L (ref 135–145)
Sodium: 134 mmol/L — ABNORMAL LOW (ref 135–145)
Sodium: 136 mmol/L (ref 135–145)
Sodium: 139 mmol/L (ref 135–145)
Sodium: 138 mmol/L (ref 135–145)
Sodium: 138 mmol/L (ref 135–145)
Sodium: 137 mmol/L (ref 135–145)
Sodium: 139 mmol/L (ref 135–145)
TCO2: 25 mmol/L (ref 22–32)
TCO2: 27 mmol/L (ref 22–32)
TCO2: 24 mmol/L (ref 22–32)
TCO2: 24 mmol/L (ref 22–32)
TCO2: 23 mmol/L (ref 22–32)
TCO2: 21 mmol/L — ABNORMAL LOW (ref 22–32)
TCO2: 22 mmol/L (ref 22–32)
TCO2: 21 mmol/L — ABNORMAL LOW (ref 22–32)
TCO2: 20 mmol/L — ABNORMAL LOW (ref 22–32)
TCO2: 23 mmol/L (ref 22–32)
pCO2 arterial: 46.1 mmHg (ref 32.0–48.0)
pCO2 arterial: 51.1 mmHg — ABNORMAL HIGH (ref 32.0–48.0)
pCO2 arterial: 41.4 mmHg (ref 32.0–48.0)
pCO2 arterial: 40.5 mmHg (ref 32.0–48.0)
pCO2 arterial: 40.2 mmHg (ref 32.0–48.0)
pCO2 arterial: 41.5 mmHg (ref 32.0–48.0)
pCO2 arterial: 43.6 mmHg (ref 32.0–48.0)
pCO2 arterial: 39.8 mmHg (ref 32.0–48.0)
pCO2 arterial: 35.2 mmHg (ref 32.0–48.0)
pCO2 arterial: 39.8 mmHg (ref 32.0–48.0)
pH, Arterial: 7.312 — ABNORMAL LOW (ref 7.350–7.450)
pH, Arterial: 7.31 — ABNORMAL LOW (ref 7.350–7.450)
pH, Arterial: 7.357 (ref 7.350–7.450)
pH, Arterial: 7.355 (ref 7.350–7.450)
pH, Arterial: 7.348 — ABNORMAL LOW (ref 7.350–7.450)
pH, Arterial: 7.285 — ABNORMAL LOW (ref 7.350–7.450)
pH, Arterial: 7.271 — ABNORMAL LOW (ref 7.350–7.450)
pH, Arterial: 7.307 — ABNORMAL LOW (ref 7.350–7.450)
pH, Arterial: 7.332 — ABNORMAL LOW (ref 7.350–7.450)
pH, Arterial: 7.336 — ABNORMAL LOW (ref 7.350–7.450)
pO2, Arterial: 91 mmHg (ref 83.0–108.0)
pO2, Arterial: 316 mmHg — ABNORMAL HIGH (ref 83.0–108.0)
pO2, Arterial: 251 mmHg — ABNORMAL HIGH (ref 83.0–108.0)
pO2, Arterial: 225 mmHg — ABNORMAL HIGH (ref 83.0–108.0)
pO2, Arterial: 79 mmHg — ABNORMAL LOW (ref 83.0–108.0)
pO2, Arterial: 65 mmHg — ABNORMAL LOW (ref 83.0–108.0)
pO2, Arterial: 112 mmHg — ABNORMAL HIGH (ref 83.0–108.0)
pO2, Arterial: 465 mmHg — ABNORMAL HIGH (ref 83.0–108.0)
pO2, Arterial: 182 mmHg — ABNORMAL HIGH (ref 83.0–108.0)
pO2, Arterial: 266 mmHg — ABNORMAL HIGH (ref 83.0–108.0)

## 2021-10-05 LAB — ECHO INTRAOPERATIVE TEE
AR max vel: 1.05 cm2
AV Area VTI: 0.99 cm2
AV Area mean vel: 1.16 cm2
AV Mean grad: 35 mmHg
AV Peak grad: 70.6 mmHg
Ao pk vel: 4.2 m/s
Height: 71 in
MV VTI: 2.58 cm2
S' Lateral: 3.85 cm
Weight: 4696.01 oz

## 2021-10-05 LAB — BASIC METABOLIC PANEL WITH GFR
Anion gap: 11 (ref 5–15)
BUN: 44 mg/dL — ABNORMAL HIGH (ref 8–23)
CO2: 21 mmol/L — ABNORMAL LOW (ref 22–32)
Calcium: 7.5 mg/dL — ABNORMAL LOW (ref 8.9–10.3)
Chloride: 104 mmol/L (ref 98–111)
Creatinine, Ser: 2.61 mg/dL — ABNORMAL HIGH (ref 0.61–1.24)
GFR, Estimated: 27 mL/min — ABNORMAL LOW
Glucose, Bld: 173 mg/dL — ABNORMAL HIGH (ref 70–99)
Potassium: 3.9 mmol/L (ref 3.5–5.1)
Sodium: 136 mmol/L (ref 135–145)

## 2021-10-05 LAB — POCT I-STAT, CHEM 8
BUN: 37 mg/dL — ABNORMAL HIGH (ref 8–23)
BUN: 40 mg/dL — ABNORMAL HIGH (ref 8–23)
BUN: 37 mg/dL — ABNORMAL HIGH (ref 8–23)
BUN: 39 mg/dL — ABNORMAL HIGH (ref 8–23)
BUN: 41 mg/dL — ABNORMAL HIGH (ref 8–23)
Calcium, Ion: 1.28 mmol/L (ref 1.15–1.40)
Calcium, Ion: 1.24 mmol/L (ref 1.15–1.40)
Calcium, Ion: 1.15 mmol/L (ref 1.15–1.40)
Calcium, Ion: 1.16 mmol/L (ref 1.15–1.40)
Calcium, Ion: 1.09 mmol/L — ABNORMAL LOW (ref 1.15–1.40)
Chloride: 104 mmol/L (ref 98–111)
Chloride: 105 mmol/L (ref 98–111)
Chloride: 107 mmol/L (ref 98–111)
Chloride: 106 mmol/L (ref 98–111)
Chloride: 106 mmol/L (ref 98–111)
Creatinine, Ser: 2 mg/dL — ABNORMAL HIGH (ref 0.61–1.24)
Creatinine, Ser: 2.1 mg/dL — ABNORMAL HIGH (ref 0.61–1.24)
Creatinine, Ser: 2.2 mg/dL — ABNORMAL HIGH (ref 0.61–1.24)
Creatinine, Ser: 2.2 mg/dL — ABNORMAL HIGH (ref 0.61–1.24)
Creatinine, Ser: 2.2 mg/dL — ABNORMAL HIGH (ref 0.61–1.24)
Glucose, Bld: 144 mg/dL — ABNORMAL HIGH (ref 70–99)
Glucose, Bld: 117 mg/dL — ABNORMAL HIGH (ref 70–99)
Glucose, Bld: 119 mg/dL — ABNORMAL HIGH (ref 70–99)
Glucose, Bld: 123 mg/dL — ABNORMAL HIGH (ref 70–99)
Glucose, Bld: 154 mg/dL — ABNORMAL HIGH (ref 70–99)
HCT: 41 % (ref 39.0–52.0)
HCT: 39 % (ref 39.0–52.0)
HCT: 33 % — ABNORMAL LOW (ref 39.0–52.0)
HCT: 33 % — ABNORMAL LOW (ref 39.0–52.0)
HCT: 33 % — ABNORMAL LOW (ref 39.0–52.0)
Hemoglobin: 13.9 g/dL (ref 13.0–17.0)
Hemoglobin: 13.3 g/dL (ref 13.0–17.0)
Hemoglobin: 11.2 g/dL — ABNORMAL LOW (ref 13.0–17.0)
Hemoglobin: 11.2 g/dL — ABNORMAL LOW (ref 13.0–17.0)
Hemoglobin: 11.2 g/dL — ABNORMAL LOW (ref 13.0–17.0)
Potassium: 4.1 mmol/L (ref 3.5–5.1)
Potassium: 4.8 mmol/L (ref 3.5–5.1)
Potassium: 5.2 mmol/L — ABNORMAL HIGH (ref 3.5–5.1)
Potassium: 6.1 mmol/L — ABNORMAL HIGH (ref 3.5–5.1)
Potassium: 4.9 mmol/L (ref 3.5–5.1)
Sodium: 138 mmol/L (ref 135–145)
Sodium: 136 mmol/L (ref 135–145)
Sodium: 135 mmol/L (ref 135–145)
Sodium: 135 mmol/L (ref 135–145)
Sodium: 137 mmol/L (ref 135–145)
TCO2: 25 mmol/L (ref 22–32)
TCO2: 24 mmol/L (ref 22–32)
TCO2: 23 mmol/L (ref 22–32)
TCO2: 22 mmol/L (ref 22–32)
TCO2: 20 mmol/L — ABNORMAL LOW (ref 22–32)

## 2021-10-05 LAB — POCT I-STAT EG7
Acid-base deficit: 2 mmol/L (ref 0.0–2.0)
Bicarbonate: 24.4 mmol/L (ref 20.0–28.0)
Calcium, Ion: 1.18 mmol/L (ref 1.15–1.40)
HCT: 33 % — ABNORMAL LOW (ref 39.0–52.0)
Hemoglobin: 11.2 g/dL — ABNORMAL LOW (ref 13.0–17.0)
O2 Saturation: 83 %
Potassium: 5.8 mmol/L — ABNORMAL HIGH (ref 3.5–5.1)
Sodium: 136 mmol/L (ref 135–145)
TCO2: 26 mmol/L (ref 22–32)
pCO2, Ven: 50.2 mmHg (ref 44.0–60.0)
pH, Ven: 7.294 (ref 7.250–7.430)
pO2, Ven: 53 mmHg — ABNORMAL HIGH (ref 32.0–45.0)

## 2021-10-05 LAB — BASIC METABOLIC PANEL
Anion gap: 8 (ref 5–15)
BUN: 41 mg/dL — ABNORMAL HIGH (ref 8–23)
CO2: 23 mmol/L (ref 22–32)
Calcium: 8.7 mg/dL — ABNORMAL LOW (ref 8.9–10.3)
Chloride: 100 mmol/L (ref 98–111)
Creatinine, Ser: 2.1 mg/dL — ABNORMAL HIGH (ref 0.61–1.24)
GFR, Estimated: 35 mL/min — ABNORMAL LOW (ref >60)
Glucose, Bld: 159 mg/dL — ABNORMAL HIGH (ref 70–99)
Potassium: 3.7 mmol/L (ref 3.5–5.1)
Sodium: 131 mmol/L — ABNORMAL LOW (ref 135–145)

## 2021-10-05 LAB — MAGNESIUM
Magnesium: 2.5 mg/dL — ABNORMAL HIGH (ref 1.7–2.4)
Magnesium: 2.3 mg/dL (ref 1.7–2.4)

## 2021-10-05 LAB — PROCALCITONIN: Procalcitonin: 0.42 ng/mL

## 2021-10-05 LAB — CBC
HCT: 41.1 % (ref 39.0–52.0)
HCT: 36.8 % — ABNORMAL LOW (ref 39.0–52.0)
Hemoglobin: 13.2 g/dL (ref 13.0–17.0)
Hemoglobin: 12 g/dL — ABNORMAL LOW (ref 13.0–17.0)
MCH: 29.3 pg (ref 26.0–34.0)
MCH: 29.3 pg (ref 26.0–34.0)
MCHC: 32.1 g/dL (ref 30.0–36.0)
MCHC: 32.6 g/dL (ref 30.0–36.0)
MCV: 91.3 fL (ref 80.0–100.0)
MCV: 90 fL (ref 80.0–100.0)
Platelets: 223 10*3/uL (ref 150–400)
Platelets: 228 K/uL (ref 150–400)
RBC: 4.5 MIL/uL (ref 4.22–5.81)
RBC: 4.09 MIL/uL — ABNORMAL LOW (ref 4.22–5.81)
RDW: 15 % (ref 11.5–15.5)
RDW: 14.9 % (ref 11.5–15.5)
WBC: 22.3 10*3/uL — ABNORMAL HIGH (ref 4.0–10.5)
WBC: 20.4 K/uL — ABNORMAL HIGH (ref 4.0–10.5)
nRBC: 0 % (ref 0.0–0.2)
nRBC: 0 % (ref 0.0–0.2)

## 2021-10-05 LAB — PROTIME-INR
INR: 1.4 — ABNORMAL HIGH (ref 0.8–1.2)
Prothrombin Time: 17.2 seconds — ABNORMAL HIGH (ref 11.4–15.2)

## 2021-10-05 LAB — APTT: aPTT: 33 seconds (ref 24–36)

## 2021-10-05 LAB — HEMOGLOBIN AND HEMATOCRIT, BLOOD
HCT: 33.6 % — ABNORMAL LOW (ref 39.0–52.0)
Hemoglobin: 11.1 g/dL — ABNORMAL LOW (ref 13.0–17.0)

## 2021-10-05 LAB — PLATELET COUNT: Platelets: 180 10*3/uL (ref 150–400)

## 2021-10-05 SURGERY — CORONARY ARTERY BYPASS GRAFTING (CABG)
Anesthesia: General | Site: Chest

## 2021-10-05 MED ORDER — LACTATED RINGERS IV SOLN: INTRAVENOUS | Status: DC | PRN

## 2021-10-05 MED ORDER — METOPROLOL TARTRATE 25 MG/10 ML ORAL SUSPENSION: 12.5000 mg | Freq: Two times a day (BID) | ORAL | Status: DC

## 2021-10-05 MED ORDER — TRAMADOL HCL 50 MG PO TABS: 50.0000 mg | ORAL_TABLET | ORAL | Status: DC | PRN

## 2021-10-05 MED ORDER — FENTANYL CITRATE (PF) 250 MCG/5ML IJ SOLN
INTRAMUSCULAR | Status: AC
  Filled 2021-10-05: qty 5

## 2021-10-05 MED ORDER — NOREPINEPHRINE 4 MG/250ML-% IV SOLN
0.0000 ug/min | INTRAVENOUS | Status: DC
  Administered 2021-10-05: 23:00:00 18 ug/min via INTRAVENOUS
  Administered 2021-10-05: 20:00:00 20 ug/min via INTRAVENOUS
  Administered 2021-10-05: 16:00:00 26 ug/min via INTRAVENOUS
  Administered 2021-10-06: 04:00:00 8 ug/min via INTRAVENOUS
  Administered 2021-10-06: 13:00:00 14 ug/min via INTRAVENOUS
  Administered 2021-10-06: 21:00:00 6 ug/min via INTRAVENOUS
  Filled 2021-10-05 (×7): qty 250

## 2021-10-05 MED ORDER — METOPROLOL TARTRATE 5 MG/5ML IV SOLN
2.5000 mg | INTRAVENOUS | Status: DC | PRN
  Administered 2021-10-07 – 2021-10-09 (×8): 5 mg via INTRAVENOUS
  Administered 2021-10-09: 2.5 mg via INTRAVENOUS
  Administered 2021-10-09 – 2021-10-11 (×2): 5 mg via INTRAVENOUS
  Filled 2021-10-05 (×11): qty 5

## 2021-10-05 MED ORDER — FENTANYL CITRATE PF 50 MCG/ML IJ SOSY: 50.0000 ug | PREFILLED_SYRINGE | INTRAMUSCULAR | Status: DC | PRN

## 2021-10-05 MED ORDER — MYCOPHENOLATE MOFETIL 250 MG PO CAPS
1000.0000 mg | ORAL_CAPSULE | Freq: Two times a day (BID) | ORAL | Status: DC
  Filled 2021-10-05: qty 4

## 2021-10-05 MED ORDER — PROPOFOL 10 MG/ML IV BOLUS
INTRAVENOUS | Status: DC | PRN
  Administered 2021-10-05 (×2): 50 mg via INTRAVENOUS

## 2021-10-05 MED ORDER — ACETAMINOPHEN 500 MG PO TABS
1000.0000 mg | ORAL_TABLET | Freq: Four times a day (QID) | ORAL | Status: AC
  Administered 2021-10-06 – 2021-10-10 (×17): 1000 mg via ORAL
  Filled 2021-10-05 (×17): qty 2

## 2021-10-05 MED ORDER — ACETAMINOPHEN 160 MG/5ML PO SOLN: 650.0000 mg | Freq: Once | ORAL | Status: AC

## 2021-10-05 MED ORDER — PROTAMINE SULFATE 10 MG/ML IV SOLN
INTRAVENOUS | Status: DC | PRN
  Administered 2021-10-05: 20 mg via INTRAVENOUS
  Administered 2021-10-05: 380 mg via INTRAVENOUS

## 2021-10-05 MED ORDER — PROPOFOL 10 MG/ML IV BOLUS
INTRAVENOUS | Status: AC
  Filled 2021-10-05: qty 20

## 2021-10-05 MED ORDER — PHENYLEPHRINE HCL-NACL 20-0.9 MG/250ML-% IV SOLN
INTRAVENOUS | Status: DC | PRN
  Administered 2021-10-05: 25 ug/min via INTRAVENOUS

## 2021-10-05 MED ORDER — MIDAZOLAM HCL (PF) 10 MG/2ML IJ SOLN
INTRAMUSCULAR | Status: AC
  Filled 2021-10-05: qty 2

## 2021-10-05 MED ORDER — MIDAZOLAM HCL (PF) 5 MG/ML IJ SOLN
INTRAMUSCULAR | Status: DC | PRN
  Administered 2021-10-05: 2 mg via INTRAVENOUS
  Administered 2021-10-05: 3 mg via INTRAVENOUS
  Administered 2021-10-05 (×3): 1 mg via INTRAVENOUS

## 2021-10-05 MED ORDER — MYCOPHENOLATE 200 MG/ML ORAL SUSPENSION
1000.0000 mg | Freq: Two times a day (BID) | ORAL | Status: DC
  Filled 2021-10-05: qty 20

## 2021-10-05 MED ORDER — ALBUMIN HUMAN 5 % IV SOLN: INTRAVENOUS | Status: DC | PRN

## 2021-10-05 MED ORDER — PANTOPRAZOLE SODIUM 40 MG IV SOLR
40.0000 mg | Freq: Every day | INTRAVENOUS | Status: DC
  Administered 2021-10-05 – 2021-10-10 (×6): 40 mg via INTRAVENOUS
  Filled 2021-10-05 (×6): qty 40

## 2021-10-05 MED ORDER — EPHEDRINE 5 MG/ML INJ
INTRAVENOUS | Status: AC
  Filled 2021-10-05: qty 5

## 2021-10-05 MED ORDER — PHENYLEPHRINE 40 MCG/ML (10ML) SYRINGE FOR IV PUSH (FOR BLOOD PRESSURE SUPPORT)
PREFILLED_SYRINGE | INTRAVENOUS | Status: DC | PRN
  Administered 2021-10-05 (×4): 80 ug via INTRAVENOUS

## 2021-10-05 MED ORDER — PANTOPRAZOLE SODIUM 40 MG PO TBEC: 40.0000 mg | DELAYED_RELEASE_TABLET | Freq: Every day | ORAL | Status: DC

## 2021-10-05 MED ORDER — MIDAZOLAM HCL 2 MG/2ML IJ SOLN: 2.0000 mg | INTRAMUSCULAR | Status: DC | PRN

## 2021-10-05 MED ORDER — ORAL CARE MOUTH RINSE: 15.0000 mL | OROMUCOSAL | Status: DC

## 2021-10-05 MED ORDER — VASOPRESSIN 20 UNIT/ML IV SOLN
INTRAVENOUS | Status: AC
  Filled 2021-10-05: qty 1

## 2021-10-05 MED ORDER — ~~LOC~~ CARDIAC SURGERY, PATIENT & FAMILY EDUCATION
Freq: Once | Status: DC
  Filled 2021-10-05: qty 1

## 2021-10-05 MED ORDER — CHLORHEXIDINE GLUCONATE 0.12% ORAL RINSE (MEDLINE KIT)
15.0000 mL | Freq: Two times a day (BID) | OROMUCOSAL | Status: DC
  Administered 2021-10-05 – 2021-10-06 (×2): 15 mL via OROMUCOSAL

## 2021-10-05 MED ORDER — SODIUM CHLORIDE 0.9% FLUSH
10.0000 mL | Freq: Two times a day (BID) | INTRAVENOUS | Status: DC
  Administered 2021-10-05 – 2021-10-10 (×10): 10 mL

## 2021-10-05 MED ORDER — CEFAZOLIN SODIUM-DEXTROSE 2-4 GM/100ML-% IV SOLN
2.0000 g | Freq: Three times a day (TID) | INTRAVENOUS | Status: DC
  Filled 2021-10-05: qty 100

## 2021-10-05 MED ORDER — 0.9 % SODIUM CHLORIDE (POUR BTL) OPTIME
TOPICAL | Status: DC | PRN
  Administered 2021-10-05: 07:00:00 5000 mL

## 2021-10-05 MED ORDER — SODIUM CHLORIDE 0.9% FLUSH
3.0000 mL | Freq: Two times a day (BID) | INTRAVENOUS | Status: DC
  Administered 2021-10-06 – 2021-10-11 (×9): 3 mL via INTRAVENOUS

## 2021-10-05 MED ORDER — SODIUM CHLORIDE 0.9% FLUSH: 3.0000 mL | INTRAVENOUS | Status: DC | PRN

## 2021-10-05 MED ORDER — POTASSIUM CHLORIDE 10 MEQ/50ML IV SOLN: 10.0000 meq | INTRAVENOUS | Status: AC

## 2021-10-05 MED ORDER — EPINEPHRINE HCL 5 MG/250ML IV SOLN IN NS
0.5000 ug/min | INTRAVENOUS | Status: DC
  Administered 2021-10-05: 7 ug/min via INTRAVENOUS
  Filled 2021-10-05: qty 250

## 2021-10-05 MED ORDER — SODIUM BICARBONATE 8.4 % IV SOLN
100.0000 meq | Freq: Once | INTRAVENOUS | Status: AC
  Administered 2021-10-05: 20:00:00 100 meq via INTRAVENOUS

## 2021-10-05 MED ORDER — ASPIRIN EC 325 MG PO TBEC
325.0000 mg | DELAYED_RELEASE_TABLET | Freq: Every day | ORAL | Status: DC
  Administered 2021-10-06 – 2021-10-10 (×5): 325 mg via ORAL
  Filled 2021-10-05 (×5): qty 1

## 2021-10-05 MED ORDER — DOCUSATE SODIUM 100 MG PO CAPS: 200.0000 mg | ORAL_CAPSULE | Freq: Every day | ORAL | Status: DC

## 2021-10-05 MED ORDER — MAGNESIUM SULFATE 4 GM/100ML IV SOLN: 4.0000 g | Freq: Once | INTRAVENOUS | Status: DC

## 2021-10-05 MED ORDER — SODIUM CHLORIDE 0.9% FLUSH: 10.0000 mL | INTRAVENOUS | Status: DC | PRN

## 2021-10-05 MED ORDER — PHENYLEPHRINE HCL-NACL 20-0.9 MG/250ML-% IV SOLN: 0.0000 ug/min | INTRAVENOUS | Status: DC

## 2021-10-05 MED ORDER — BISACODYL 5 MG PO TBEC
10.0000 mg | DELAYED_RELEASE_TABLET | Freq: Every day | ORAL | Status: DC
  Administered 2021-10-06 – 2021-10-11 (×6): 10 mg via ORAL
  Filled 2021-10-05 (×6): qty 2

## 2021-10-05 MED ORDER — VASOPRESSIN 20 UNIT/ML IV SOLN
INTRAVENOUS | Status: DC | PRN
  Administered 2021-10-05 (×4): 1 [IU] via INTRAVENOUS

## 2021-10-05 MED ORDER — CHLORHEXIDINE GLUCONATE 0.12 % MT SOLN
15.0000 mL | OROMUCOSAL | Status: AC
  Administered 2021-10-05: 16:00:00 15 mL via OROMUCOSAL

## 2021-10-05 MED ORDER — THROMBIN 20000 UNITS EX SOLR
OROMUCOSAL | Status: DC | PRN
  Administered 2021-10-05 (×3): 4 mL via TOPICAL

## 2021-10-05 MED ORDER — DOCUSATE SODIUM 50 MG/5ML PO LIQD
100.0000 mg | Freq: Two times a day (BID) | ORAL | Status: DC
  Administered 2021-10-05 – 2021-10-10 (×7): 100 mg
  Filled 2021-10-05 (×8): qty 10

## 2021-10-05 MED ORDER — DULOXETINE HCL 60 MG PO CPEP
60.0000 mg | ORAL_CAPSULE | Freq: Every day | ORAL | Status: DC
  Administered 2021-10-06 – 2021-10-18 (×13): 60 mg via ORAL
  Filled 2021-10-05 (×13): qty 1

## 2021-10-05 MED ORDER — SODIUM CHLORIDE 0.9 % IV SOLN
2.0000 g | Freq: Two times a day (BID) | INTRAVENOUS | Status: DC
  Administered 2021-10-05: 17:00:00 2 g via INTRAVENOUS
  Filled 2021-10-05: qty 2

## 2021-10-05 MED ORDER — DEXTROSE 50 % IV SOLN: 0.0000 mL | INTRAVENOUS | Status: DC | PRN

## 2021-10-05 MED ORDER — POLYETHYLENE GLYCOL 3350 17 G PO PACK
17.0000 g | PACK | Freq: Every day | ORAL | Status: DC
  Administered 2021-10-05 – 2021-10-11 (×6): 17 g
  Filled 2021-10-05 (×5): qty 1

## 2021-10-05 MED ORDER — ALBUTEROL SULFATE HFA 108 (90 BASE) MCG/ACT IN AERS
INHALATION_SPRAY | RESPIRATORY_TRACT | Status: DC | PRN
  Administered 2021-10-05 (×2): 2 via RESPIRATORY_TRACT

## 2021-10-05 MED ORDER — CHLORHEXIDINE GLUCONATE 0.12 % MT SOLN
15.0000 mL | Freq: Once | OROMUCOSAL | Status: AC
  Administered 2021-10-05: 06:00:00 15 mL via OROMUCOSAL
  Filled 2021-10-05: qty 15

## 2021-10-05 MED ORDER — INSULIN REGULAR(HUMAN) IN NACL 100-0.9 UT/100ML-% IV SOLN
INTRAVENOUS | Status: DC
  Administered 2021-10-05: 15:00:00 3.4 [IU]/h via INTRAVENOUS
  Administered 2021-10-06: 04:00:00 5.5 [IU]/h via INTRAVENOUS
  Filled 2021-10-05 (×2): qty 100

## 2021-10-05 MED ORDER — SODIUM CHLORIDE (PF) 0.9 % IJ SOLN
INTRAMUSCULAR | Status: AC
  Filled 2021-10-05: qty 10

## 2021-10-05 MED ORDER — VANCOMYCIN HCL IN DEXTROSE 1-5 GM/200ML-% IV SOLN
1000.0000 mg | Freq: Once | INTRAVENOUS | Status: AC
  Administered 2021-10-05: 21:00:00 1000 mg via INTRAVENOUS
  Filled 2021-10-05: qty 200

## 2021-10-05 MED ORDER — THROMBIN 20000 UNITS EX SOLR
CUTANEOUS | Status: DC | PRN
  Administered 2021-10-05: 07:00:00 20000 [IU] via TOPICAL

## 2021-10-05 MED ORDER — ROSUVASTATIN CALCIUM 20 MG PO TABS
20.0000 mg | ORAL_TABLET | Freq: Every day | ORAL | Status: DC
  Administered 2021-10-06 – 2021-10-12 (×7): 20 mg via ORAL
  Filled 2021-10-05 (×7): qty 1

## 2021-10-05 MED ORDER — LACTATED RINGERS IV SOLN: INTRAVENOUS | Status: DC

## 2021-10-05 MED ORDER — FAMOTIDINE IN NACL 20-0.9 MG/50ML-% IV SOLN
20.0000 mg | Freq: Two times a day (BID) | INTRAVENOUS | Status: AC
  Administered 2021-10-05 (×2): 20 mg via INTRAVENOUS
  Filled 2021-10-05 (×2): qty 50

## 2021-10-05 MED ORDER — ASPIRIN 81 MG PO CHEW: 324.0000 mg | CHEWABLE_TABLET | Freq: Every day | ORAL | Status: DC

## 2021-10-05 MED ORDER — LACTATED RINGERS IV SOLN
500.0000 mL | Freq: Once | INTRAVENOUS | Status: AC | PRN
  Administered 2021-10-05: 17:00:00 500 mL via INTRAVENOUS

## 2021-10-05 MED ORDER — SODIUM BICARBONATE 8.4 % IV SOLN
50.0000 meq | Freq: Once | INTRAVENOUS | Status: AC
  Administered 2021-10-05: 21:00:00 50 meq via INTRAVENOUS
  Filled 2021-10-05: qty 50

## 2021-10-05 MED ORDER — BISACODYL 10 MG RE SUPP: 10.0000 mg | Freq: Every day | RECTAL | Status: DC

## 2021-10-05 MED ORDER — ROCURONIUM BROMIDE 10 MG/ML (PF) SYRINGE
PREFILLED_SYRINGE | INTRAVENOUS | Status: AC
  Filled 2021-10-05: qty 20

## 2021-10-05 MED ORDER — ONDANSETRON HCL 4 MG/2ML IJ SOLN: 4.0000 mg | Freq: Four times a day (QID) | INTRAMUSCULAR | Status: DC | PRN

## 2021-10-05 MED ORDER — ACETAMINOPHEN 160 MG/5ML PO SOLN
1000.0000 mg | Freq: Four times a day (QID) | ORAL | Status: AC
  Administered 2021-10-05 – 2021-10-06 (×2): 1000 mg
  Filled 2021-10-05 (×3): qty 40.6

## 2021-10-05 MED ORDER — THROMBIN (RECOMBINANT) 20000 UNITS EX SOLR
CUTANEOUS | Status: AC
  Filled 2021-10-05: qty 20000

## 2021-10-05 MED ORDER — METOPROLOL TARTRATE 12.5 MG HALF TABLET: 12.5000 mg | ORAL_TABLET | Freq: Two times a day (BID) | ORAL | Status: DC

## 2021-10-05 MED ORDER — PHENYLEPHRINE 40 MCG/ML (10ML) SYRINGE FOR IV PUSH (FOR BLOOD PRESSURE SUPPORT)
PREFILLED_SYRINGE | INTRAVENOUS | Status: AC
  Filled 2021-10-05: qty 10

## 2021-10-05 MED ORDER — CHLORHEXIDINE GLUCONATE 4 % EX LIQD: 30.0000 mL | CUTANEOUS | Status: DC

## 2021-10-05 MED ORDER — ALBUMIN HUMAN 5 % IV SOLN
250.0000 mL | INTRAVENOUS | Status: DC | PRN
  Administered 2021-10-05 (×3): 12.5 g via INTRAVENOUS
  Filled 2021-10-05: qty 250

## 2021-10-05 MED ORDER — CHLORHEXIDINE GLUCONATE CLOTH 2 % EX PADS
6.0000 | MEDICATED_PAD | Freq: Every day | CUTANEOUS | Status: DC
  Administered 2021-10-05 – 2021-10-11 (×7): 6 via TOPICAL

## 2021-10-05 MED ORDER — EPHEDRINE SULFATE 50 MG/ML IJ SOLN
INTRAMUSCULAR | Status: DC | PRN
  Administered 2021-10-05 (×3): 5 mg via INTRAVENOUS

## 2021-10-05 MED ORDER — MORPHINE SULFATE (PF) 2 MG/ML IV SOLN
1.0000 mg | INTRAVENOUS | Status: DC | PRN
  Administered 2021-10-05: 16:00:00 4 mg via INTRAVENOUS
  Administered 2021-10-05 – 2021-10-06 (×2): 2 mg via INTRAVENOUS
  Administered 2021-10-06: 4 mg via INTRAVENOUS
  Filled 2021-10-05: qty 2
  Filled 2021-10-05 (×2): qty 1
  Filled 2021-10-05: qty 2
  Filled 2021-10-05: qty 1

## 2021-10-05 MED ORDER — SODIUM CHLORIDE 0.9 % IV SOLN: INTRAVENOUS | Status: DC

## 2021-10-05 MED ORDER — OXYCODONE HCL 5 MG PO TABS
5.0000 mg | ORAL_TABLET | ORAL | Status: DC | PRN
  Administered 2021-10-06 (×2): 10 mg via ORAL
  Administered 2021-10-07: 02:00:00 5 mg via ORAL
  Administered 2021-10-09 – 2021-10-14 (×7): 10 mg via ORAL
  Administered 2021-10-14: 5 mg via ORAL
  Administered 2021-10-14: 10 mg via ORAL
  Administered 2021-10-14: 5 mg via ORAL
  Administered 2021-10-15 (×3): 10 mg via ORAL
  Administered 2021-10-16 – 2021-10-18 (×3): 5 mg via ORAL
  Filled 2021-10-05 (×4): qty 2
  Filled 2021-10-05: qty 1
  Filled 2021-10-05 (×2): qty 2
  Filled 2021-10-05 (×2): qty 1
  Filled 2021-10-05: qty 2
  Filled 2021-10-05: qty 1
  Filled 2021-10-05 (×3): qty 2
  Filled 2021-10-05: qty 1
  Filled 2021-10-05 (×2): qty 2
  Filled 2021-10-05: qty 1
  Filled 2021-10-05 (×2): qty 2

## 2021-10-05 MED ORDER — MILRINONE LACTATE IN DEXTROSE 20-5 MG/100ML-% IV SOLN
0.1250 ug/kg/min | INTRAVENOUS | Status: DC
  Administered 2021-10-05 – 2021-10-06 (×2): 0.25 ug/kg/min via INTRAVENOUS
  Filled 2021-10-05 (×3): qty 100

## 2021-10-05 MED ORDER — ROCURONIUM BROMIDE 10 MG/ML (PF) SYRINGE
PREFILLED_SYRINGE | INTRAVENOUS | Status: DC | PRN
  Administered 2021-10-05: 100 mg via INTRAVENOUS
  Administered 2021-10-05: 50 mg via INTRAVENOUS
  Administered 2021-10-05: 20 mg via INTRAVENOUS
  Administered 2021-10-05 (×2): 50 mg via INTRAVENOUS

## 2021-10-05 MED ORDER — HEPARIN SODIUM (PORCINE) 1000 UNIT/ML IJ SOLN
INTRAMUSCULAR | Status: AC
  Filled 2021-10-05: qty 2

## 2021-10-05 MED ORDER — ACETAMINOPHEN 650 MG RE SUPP
650.0000 mg | Freq: Once | RECTAL | Status: AC
  Administered 2021-10-05: 16:00:00 650 mg via RECTAL

## 2021-10-05 MED ORDER — FENTANYL CITRATE (PF) 250 MCG/5ML IJ SOLN
INTRAMUSCULAR | Status: DC | PRN
  Administered 2021-10-05 (×2): 50 ug via INTRAVENOUS
  Administered 2021-10-05: 200 ug via INTRAVENOUS
  Administered 2021-10-05 (×2): 50 ug via INTRAVENOUS

## 2021-10-05 MED ORDER — HEMOSTATIC AGENTS (NO CHARGE) OPTIME
TOPICAL | Status: DC | PRN
  Administered 2021-10-05: 1 via TOPICAL

## 2021-10-05 MED ORDER — MYCOPHENOLATE MOFETIL 250 MG PO CAPS
1000.0000 mg | ORAL_CAPSULE | Freq: Two times a day (BID) | ORAL | Status: DC
  Administered 2021-10-06 – 2021-10-18 (×25): 1000 mg via ORAL
  Filled 2021-10-05 (×28): qty 4

## 2021-10-05 MED ORDER — PROTAMINE SULFATE 10 MG/ML IV SOLN
INTRAVENOUS | Status: AC
  Filled 2021-10-05: qty 50

## 2021-10-05 MED ORDER — HEPARIN SODIUM (PORCINE) 1000 UNIT/ML IJ SOLN
INTRAMUSCULAR | Status: DC | PRN
  Administered 2021-10-05: 44000 [IU] via INTRAVENOUS

## 2021-10-05 MED ORDER — ORAL CARE MOUTH RINSE
15.0000 mL | OROMUCOSAL | Status: DC
  Administered 2021-10-05 – 2021-10-06 (×7): 15 mL via OROMUCOSAL

## 2021-10-05 MED ORDER — SODIUM CHLORIDE 0.9 % IV SOLN: 250.0000 mL | INTRAVENOUS | Status: DC

## 2021-10-05 MED ORDER — NITROGLYCERIN IN D5W 200-5 MCG/ML-% IV SOLN: 0.0000 ug/min | INTRAVENOUS | Status: DC

## 2021-10-05 MED ORDER — DEXMEDETOMIDINE HCL IN NACL 400 MCG/100ML IV SOLN
0.0000 ug/kg/h | INTRAVENOUS | Status: DC
  Administered 2021-10-05 – 2021-10-06 (×4): 0.7 ug/kg/h via INTRAVENOUS
  Filled 2021-10-05 (×4): qty 100

## 2021-10-05 MED ORDER — PLASMA-LYTE A IV SOLN
INTRAVENOUS | Status: DC | PRN
  Administered 2021-10-05: 07:00:00 1000 mL

## 2021-10-05 MED ORDER — PREDNISONE 5 MG PO TABS
5.0000 mg | ORAL_TABLET | Freq: Every day | ORAL | Status: DC
  Administered 2021-10-06 – 2021-10-18 (×13): 5 mg via ORAL
  Filled 2021-10-05 (×14): qty 1

## 2021-10-05 MED ORDER — METOPROLOL TARTRATE 12.5 MG HALF TABLET: 12.5000 mg | ORAL_TABLET | Freq: Once | ORAL | Status: DC

## 2021-10-05 MED ORDER — SODIUM CHLORIDE 0.45 % IV SOLN: INTRAVENOUS | Status: DC | PRN

## 2021-10-05 SURGICAL SUPPLY — 131 items
ADAPTER CARDIO PERF ANTE/RETRO (ADAPTER) ×5 IMPLANT
ADPR PRFSN 84XANTGRD RTRGD (ADAPTER) ×3
BAG DECANTER FOR FLEXI CONT (MISCELLANEOUS) ×5 IMPLANT
BLADE CLIPPER SURG (BLADE) ×5 IMPLANT
BLADE STERNUM SYSTEM 6 (BLADE) ×5 IMPLANT
BLADE SURG 15 STRL LF DISP TIS (BLADE) ×3 IMPLANT
BLADE SURG 15 STRL SS (BLADE) ×5
BNDG ELASTIC 4X5.8 VLCR STR LF (GAUZE/BANDAGES/DRESSINGS) ×3 IMPLANT
BNDG ELASTIC 6X5.8 VLCR STR LF (GAUZE/BANDAGES/DRESSINGS) ×3 IMPLANT
BNDG GAUZE ELAST 4 BULKY (GAUZE/BANDAGES/DRESSINGS) ×3 IMPLANT
CANISTER SUCT 3000ML PPV (MISCELLANEOUS) ×5 IMPLANT
CANNULA ARTERIAL NVNT 3/8 22FR (MISCELLANEOUS) ×2 IMPLANT
CANNULA GUNDRY RCSP 15FR (MISCELLANEOUS) ×5 IMPLANT
CANNULA SUMP PERICARDIAL (CANNULA) ×2 IMPLANT
CATH HEART VENT LEFT (CATHETERS) ×3 IMPLANT
CATH ROBINSON RED A/P 18FR (CATHETERS) ×15 IMPLANT
CATH THORACIC 28FR (CATHETERS) ×5 IMPLANT
CATH THORACIC 36FR (CATHETERS) ×5 IMPLANT
CATH THORACIC 36FR RT ANG (CATHETERS) ×5 IMPLANT
CK QUICK LOAD INDIVIDUAL WRAP (Prosthesis & Implant Heart) ×2 IMPLANT
CLIP TI WIDE RED SMALL 24 (CLIP) ×2 IMPLANT
CLIP VESOCCLUDE MED 24/CT (CLIP) IMPLANT
CLIP VESOCCLUDE SM WIDE 24/CT (CLIP) IMPLANT
CNTNR URN SCR LID CUP LEK RST (MISCELLANEOUS) ×3 IMPLANT
CONN 3/8X3/8 GISH STERILE (MISCELLANEOUS) ×2 IMPLANT
CONT SPEC 4OZ STRL OR WHT (MISCELLANEOUS) ×5
CONTAINER PROTECT SURGISLUSH (MISCELLANEOUS) ×10 IMPLANT
COVER SURGICAL LIGHT HANDLE (MISCELLANEOUS) ×3 IMPLANT
DEVICE SUT CK QUICK LOAD INDV (Prosthesis & Implant Heart) ×2 IMPLANT
DEVICE SUT CK QUICK LOAD MINI (Prosthesis & Implant Heart) ×1 IMPLANT
DRAPE CARDIOVASCULAR INCISE (DRAPES) ×5
DRAPE SRG 135X102X78XABS (DRAPES) ×3 IMPLANT
DRAPE WARM FLUID 44X44 (DRAPES) ×5 IMPLANT
DRSG COVADERM 4X14 (GAUZE/BANDAGES/DRESSINGS) ×5 IMPLANT
ELECT BLADE 4.0 EZ CLEAN MEGAD (MISCELLANEOUS) ×5
ELECT CAUTERY BLADE 6.4 (BLADE) ×5 IMPLANT
ELECT REM PT RETURN 9FT ADLT (ELECTROSURGICAL) ×10
ELECTRODE BLDE 4.0 EZ CLN MEGD (MISCELLANEOUS) IMPLANT
ELECTRODE REM PT RTRN 9FT ADLT (ELECTROSURGICAL) ×6 IMPLANT
FELT TEFLON 1X6 (MISCELLANEOUS) ×10 IMPLANT
GAUZE 4X4 16PLY ~~LOC~~+RFID DBL (SPONGE) ×2 IMPLANT
GAUZE SPONGE 4X4 12PLY STRL (GAUZE/BANDAGES/DRESSINGS) ×10 IMPLANT
GAUZE SPONGE 4X4 12PLY STRL LF (GAUZE/BANDAGES/DRESSINGS) ×2 IMPLANT
GLOVE SURG ENC MOIS LTX SZ6 (GLOVE) IMPLANT
GLOVE SURG ENC MOIS LTX SZ6.5 (GLOVE) IMPLANT
GLOVE SURG ENC MOIS LTX SZ7 (GLOVE) IMPLANT
GLOVE SURG ENC MOIS LTX SZ7.5 (GLOVE) IMPLANT
GLOVE SURG MICRO LTX SZ6.5 (GLOVE) ×20 IMPLANT
GLOVE SURG MICRO LTX SZ7 (GLOVE) ×10 IMPLANT
GLOVE SURG MICRO LTX SZ7.5 (GLOVE) ×4 IMPLANT
GLOVE SURG ORTHO LTX SZ7.5 (GLOVE) IMPLANT
GLOVE SURG UNDER POLY LF SZ6 (GLOVE) IMPLANT
GLOVE SURG UNDER POLY LF SZ6.5 (GLOVE) IMPLANT
GLOVE SURG UNDER POLY LF SZ7 (GLOVE) IMPLANT
GOWN STRL REUS W/ TWL LRG LVL3 (GOWN DISPOSABLE) ×12 IMPLANT
GOWN STRL REUS W/ TWL XL LVL3 (GOWN DISPOSABLE) ×3 IMPLANT
GOWN STRL REUS W/TWL LRG LVL3 (GOWN DISPOSABLE) ×20
GOWN STRL REUS W/TWL XL LVL3 (GOWN DISPOSABLE) ×5
HEMOSTAT POWDER SURGIFOAM 1G (HEMOSTASIS) ×15 IMPLANT
HEMOSTAT SURGICEL 2X14 (HEMOSTASIS) ×5 IMPLANT
INSERT FOGARTY 61MM (MISCELLANEOUS) IMPLANT
INSERT FOGARTY XLG (MISCELLANEOUS) ×2 IMPLANT
KIT BASIN OR (CUSTOM PROCEDURE TRAY) ×5 IMPLANT
KIT CATH CPB BARTLE (MISCELLANEOUS) ×5 IMPLANT
KIT COMBO MINI 4X17COR-KNOT (Prosthesis & Implant Heart) ×1 IMPLANT
KIT SUCTION CATH 14FR (SUCTIONS) ×5 IMPLANT
KIT SUT CK MINI COMBO 4X17 (Prosthesis & Implant Heart) ×1 IMPLANT
KIT TURNOVER KIT B (KITS) ×5 IMPLANT
KIT VASOVIEW HEMOPRO 2 VH 4000 (KITS) ×3 IMPLANT
LINE VENT (MISCELLANEOUS) ×2 IMPLANT
LOAD QUICK .035MM CK MINI (Prosthesis & Implant Heart) ×1 IMPLANT
MAT PREVALON FULL STRYKER (MISCELLANEOUS) ×2 IMPLANT
NS IRRIG 1000ML POUR BTL (IV SOLUTION) ×30 IMPLANT
PACK E OPEN HEART (SUTURE) ×5 IMPLANT
PACK OPEN HEART (CUSTOM PROCEDURE TRAY) ×5 IMPLANT
PAD ARMBOARD 7.5X6 YLW CONV (MISCELLANEOUS) ×10 IMPLANT
PAD ELECT DEFIB RADIOL ZOLL (MISCELLANEOUS) ×5 IMPLANT
PENCIL BUTTON HOLSTER BLD 10FT (ELECTRODE) ×5 IMPLANT
POSITIONER HEAD DONUT 9IN (MISCELLANEOUS) ×5 IMPLANT
PUNCH AORTIC ROTATE 4.0MM (MISCELLANEOUS) IMPLANT
PUNCH AORTIC ROTATE 4.5MM 8IN (MISCELLANEOUS) ×3 IMPLANT
PUNCH AORTIC ROTATE 5MM 8IN (MISCELLANEOUS) IMPLANT
SET MPS 3-ND DEL (MISCELLANEOUS) ×2 IMPLANT
SOL PREP POV-IOD 4OZ 10% (MISCELLANEOUS) ×4 IMPLANT
SOL PREP PROV IODINE SCRUB 4OZ (MISCELLANEOUS) ×4 IMPLANT
SPONGE INTESTINAL PEANUT (DISPOSABLE) IMPLANT
SPONGE T-LAP 18X18 ~~LOC~~+RFID (SPONGE) ×10 IMPLANT
SPONGE T-LAP 4X18 ~~LOC~~+RFID (SPONGE) ×7 IMPLANT
SUPPORT HEART JANKE-BARRON (MISCELLANEOUS) ×5 IMPLANT
SUT BONE WAX W31G (SUTURE) ×5 IMPLANT
SUT EB EXC GRN/WHT 2-0 V-5 (SUTURE) ×10 IMPLANT
SUT ETHIBON EXCEL 2-0 V-5 (SUTURE) ×4 IMPLANT
SUT ETHIBOND V-5 VALVE (SUTURE) ×4 IMPLANT
SUT MNCRL AB 4-0 PS2 18 (SUTURE) IMPLANT
SUT PROLENE 3 0 SH DA (SUTURE) ×2 IMPLANT
SUT PROLENE 3 0 SH1 36 (SUTURE) ×7 IMPLANT
SUT PROLENE 4 0 RB 1 (SUTURE) ×15
SUT PROLENE 4 0 SH DA (SUTURE) IMPLANT
SUT PROLENE 4-0 RB1 .5 CRCL 36 (SUTURE) ×9 IMPLANT
SUT PROLENE 5 0 C 1 36 (SUTURE) ×4 IMPLANT
SUT PROLENE 6 0 C 1 30 (SUTURE) IMPLANT
SUT PROLENE 7 0 BV 1 (SUTURE) IMPLANT
SUT PROLENE 7 0 BV1 MDA (SUTURE) ×5 IMPLANT
SUT PROLENE 8 0 BV175 6 (SUTURE) IMPLANT
SUT SILK  1 MH (SUTURE)
SUT SILK 1 MH (SUTURE) IMPLANT
SUT SILK 2 0 SH (SUTURE) IMPLANT
SUT SILK 2 0 SH CR/8 (SUTURE) ×2 IMPLANT
SUT STEEL 6MS V (SUTURE) IMPLANT
SUT STEEL STERNAL CCS#1 18IN (SUTURE) IMPLANT
SUT STEEL SZ 6 DBL 3X14 BALL (SUTURE) IMPLANT
SUT VIC AB 1 CTX 36 (SUTURE) ×10
SUT VIC AB 1 CTX36XBRD ANBCTR (SUTURE) ×6 IMPLANT
SUT VIC AB 2-0 CT1 27 (SUTURE) ×5
SUT VIC AB 2-0 CT1 TAPERPNT 27 (SUTURE) IMPLANT
SUT VIC AB 2-0 CTX 27 (SUTURE) IMPLANT
SUT VIC AB 3-0 SH 27 (SUTURE)
SUT VIC AB 3-0 SH 27X BRD (SUTURE) IMPLANT
SUT VIC AB 3-0 X1 27 (SUTURE) IMPLANT
SUT VICRYL 4-0 PS2 18IN ABS (SUTURE) IMPLANT
SYSTEM SAHARA CHEST DRAIN ATS (WOUND CARE) ×5 IMPLANT
TAPE CLOTH SURG 4X10 WHT LF (GAUZE/BANDAGES/DRESSINGS) ×2 IMPLANT
TAPE PAPER 2X10 WHT MICROPORE (GAUZE/BANDAGES/DRESSINGS) ×2 IMPLANT
TOWEL GREEN STERILE (TOWEL DISPOSABLE) ×5 IMPLANT
TOWEL GREEN STERILE FF (TOWEL DISPOSABLE) ×5 IMPLANT
TRAY FOLEY SLVR 16FR TEMP STAT (SET/KITS/TRAYS/PACK) ×5 IMPLANT
TUBING LAP HI FLOW INSUFFLATIO (TUBING) ×3 IMPLANT
UNDERPAD 30X36 HEAVY ABSORB (UNDERPADS AND DIAPERS) ×5 IMPLANT
VALVE AORTIC SZ27 INSP/RESIL (Valve) ×2 IMPLANT
VENT LEFT HEART 12002 (CATHETERS) ×5
WATER STERILE IRR 1000ML POUR (IV SOLUTION) ×10 IMPLANT

## 2021-10-05 NOTE — Consult Note (Signed)
NAME:  Robert [name], MRN:  [id number], DOB:  September 12, 1961, LOS: 0 ADMISSION DATE:  10/05/2021, CONSULTATION DATE:  10/05/21 REFERRING MD:  Cyndia Bent - CVTS, CHIEF COMPLAINT: ventilator management  s/p AVR and LAD bypass   History of Present Illness:  59 yo M PMH morbid obesity, CAD, DM2 CKD IIIb, s/p nephrectomy and subsequent renal transplant in 2018, pHTN, severe AS presented 12/29 for AVR and LAD bypass. Admitted to CVTS service. Intra op -- 147 min on pump, 111 min clamp. About 500cc EBL, got about 826ml cell saver. Made 450 ml UOP Remains intubated postoperatively, PCCM consulted in this setting   Pertinent  Medical History  Morbid obesity CKD III DM2 CAD AS S/p kidney transplant  pHTN  Significant Hospital Events: Including procedures, antibiotic start and stop dates in addition to other pertinent events   12/29 AVR, LAD bypass. ICU intubated postop   Interim History / Subjective:  Arrives to ICU intubated, hypoxic  with SpO2 85 147 pump 111 clamp   Objective   Blood pressure (!) 152/79, pulse 76, temperature 98.4 F (36.9 C), temperature source Oral, resp. rate 17, height $RemoveBe'5\' 11"'PUpxYrHDk$  (1.803 m), weight 133.1 kg, SpO2 97 %.        Intake/Output Summary (Last 24 hours) at 10/05/2021 1433 Last data filed at 10/05/2021 1339 Gross per 24 hour  Intake 3480 ml  Output 934 ml  Net 2546 ml   Filed Weights   10/05/21 0605  Weight: 133.1 kg    Examination: General: Critically ill morbidly obese M intubated sedated  HENT: NCAT ETT secure anicteric sclera  Lungs: Symmetrical chest expansion. Tan respiratory secretions. Diminished basilar sounds anteriorly Cardiovascular: rr with occasional pvc. cap refill < 3 sec. Chest tubes secure  Abdomen: Obese soft ndnt  Extremities: No acute deformity. No cyanosis or clubbing  Neuro: Sedated. Does not follow commands. PERRL  GU: foley, yellow urine   Resolved Hospital Problem list     Assessment & Plan:   Post operative ventilator  management  Possible CAP in immunocompromised host -post op CXR with infiltrates (PNA vs asymmetric edema), suctioning tan secretions  P -ABG again in 1 hr -think he will maintain a pretty high PEEP requirement. Driving pressures ok on 16 PEEP -check CBC in AM -check PCT  -start empiric cefepime -send a tracheal aspirate  -VAP, PAD (RASS 0 to -1)  Cardiogenic shock -likely vasoplegia after pump run -possible slight hypovolemic component  P -NE, Epi, O.25 milrinone for MAP > 65 -albumin, IVF   Severe AS s/p AVR CAD s/p LAD bypass -POD0 12/29 P -post op per CVTS -chest tube per CVTS  -txa per pathway  CKD IIIB  S/p Renal transplant  -home cellcept, pred -intra op made about 456ml urine  P -trend renal indices -foley, strict UOP  -resume home cellcept, pred   DM P -insulin gtt as per CVTS pathway   Hypothyroidism P -will defer initiation of IV synthroid in hopes of extubation in coming day(s) (have a few days before this needs to be started inpt)   Best Practice (right click and "Reselect all SmartList Selections" daily)   Diet/type: NPO DVT prophylaxis: not indicated GI prophylaxis: PPI Lines: Central line arterial line Foley:  Yes, and it is still needed Code Status:  full code Last date of multidisciplinary goals of care discussion [per primary]  Labs   CBC: Recent Labs  Lab 10/03/21 1120 10/05/21 0756 10/05/21 1145 10/05/21 1148 10/05/21 1218 10/05/21 1252 10/05/21 1307  WBC 13.9*  --   --   --   --   --   --  HGB 15.7   < > 11.1* 11.2* 10.9* 11.6* 11.2*  HCT 49.2   < > 33.6* 33.0* 32.0* 34.0* 33.0*  MCV 92.5  --   --   --   --   --   --   PLT 215  --  180  --   --   --   --    < > = values in this interval not displayed.    Basic Metabolic Panel: Recent Labs  Lab 10/03/21 1120 10/05/21 0550 10/05/21 0756 10/05/21 0800 10/05/21 0952 10/05/21 1022 10/05/21 1048 10/05/21 1122 10/05/21 1148 10/05/21 1218 10/05/21 1252 10/05/21 1307   NA 133* 131* 138   < > 136   < > 135 135 135 134* 136 137  K 5.1 3.7 4.1   < > 4.8   < > 5.2* 5.4* 6.1* 6.2* 5.7* 4.9  CL 104 100 104  --  105  --  107  --  106  --   --  106  CO2 20* 23  --   --   --   --   --   --   --   --   --   --   GLUCOSE 325* 159* 144*  --  117*  --  119*  --  123*  --   --  154*  BUN 34* 41* 37*  --  40*  --  37*  --  39*  --   --  41*  CREATININE 2.30* 2.10* 2.00*  --  2.10*  --  2.20*  --  2.20*  --   --  2.20*  CALCIUM 9.6 8.7*  --   --   --   --   --   --   --   --   --   --    < > = values in this interval not displayed.   GFR: Estimated Creatinine Clearance: 48.5 mL/min (A) (by C-G formula based on SCr of 2.2 mg/dL (H)). Recent Labs  Lab 10/03/21 1120  WBC 13.9*    Liver Function Tests: Recent Labs  Lab 10/03/21 1120  AST 22  ALT 22  ALKPHOS 59  BILITOT 2.8*  PROT 5.7*  ALBUMIN 3.6   No results for input(s): LIPASE, AMYLASE in the last 168 hours. No results for input(s): AMMONIA in the last 168 hours.  ABG    Component Value Date/Time   PHART 7.348 (L) 10/05/2021 1252   PCO2ART 40.2 10/05/2021 1252   PO2ART 79 (L) 10/05/2021 1252   HCO3 22.1 10/05/2021 1252   TCO2 20 (L) 10/05/2021 1307   ACIDBASEDEF 3.0 (H) 10/05/2021 1252   O2SAT 95.0 10/05/2021 1252     Coagulation Profile: Recent Labs  Lab 10/03/21 1120  INR 1.0    Cardiac Enzymes: No results for input(s): CKTOTAL, CKMB, CKMBINDEX, TROPONINI in the last 168 hours.  HbA1C: Hemoglobin A1C  Date/Time Value Ref Range Status  07/10/2021 01:49 PM 8.1 (A) 4.0 - 5.6 % Final  07/07/2019 12:00 AM 6.4  Final   Hgb A1c MFr Bld  Date/Time Value Ref Range Status  10/03/2021 11:20 AM 7.1 (H) 4.8 - 5.6 % Final    Comment:    (NOTE) Pre diabetes:          5.7%-6.4%  Diabetes:              >6.4%  Glycemic control for   <7.0% adults with diabetes   03/23/2021 08:53 AM 7.5 (H) 4.6 - 6.5 %  Final    Comment:    Glycemic Control Guidelines for People with Diabetes:Non Diabetic:   <6%Goal of Therapy: <7%Additional Action Suggested:  >8%     CBG: Recent Labs  Lab 10/03/21 1054 10/05/21 0602  GLUCAP 367* 149*    Review of Systems:   Unable to obtain, intubated   Past Medical History:  He,  has a past medical history of Anemia, Arthritis, Blood transfusion without reported diagnosis (1974), Cancer (HCC), Colon polyps (07/23/2014), Constipation, Coronary artery disease, Diabetes mellitus without complication (HCC), Difficult intubation, Dysrhythmia, Eczema, ESRD (end stage renal disease) (HCC), Headache(784.0), Heart murmur, Hyperlipidemia, Hypertension, Hypothyroidism, Macular degeneration, Neuropathy, PAT (paroxysmal atrial tachycardia) (HCC), PSVT (paroxysmal supraventricular tachycardia) (HCC), SBO (small bowel obstruction) (HCC) (05/2015), Shortness of breath, Sleep apnea (06/18/2011), Thyroid disease, and Umbilical hernia.   Surgical History:   Past Surgical History:  Procedure Laterality Date   AV FISTULA PLACEMENT Right 02/25/2015   Procedure: RIGHT ARTERIOVENOUS (AV) FISTULA CREATION;  Surgeon: Nada Libman, MD;  Location: [facility] OR;  Service: Vascular;  Laterality: Right;   AV FISTULA PLACEMENT Right 09/15/2015   Procedure: ARTERIOVENOUS (AV) FISTULA CREATION- RIGHT ARM;  Surgeon: Nada Libman, MD;  Location: [facility] OR;  Service: Vascular;  Laterality: Right;   CAPD INSERTION N/A 05/18/2014   Procedure: LAPAROSCOPIC INSERTION CONTINUOUS AMBULATORY PERITONEAL DIALYSIS  (CAPD) CATHETER;  Surgeon: Axel Filler, MD;  Location: [facility] OR;  Service: General;  Laterality: N/A;   COLONOSCOPY     COLONOSCOPY WITH PROPOFOL N/A 07/12/2016   Procedure: COLONOSCOPY WITH PROPOFOL;  Surgeon: Rachael Fee, MD;  Location: [facility] END[facility];  Service: Endoscopy;  Laterality: N/A;   declotting of fistula  08/2015   ESOPHAGOGASTRODUODENOSCOPY (EGD) WITH PROPOFOL N/A 07/12/2016   Procedure: ESOPHAGOGASTRODUODENOSCOPY (EGD) WITH PROPOFOL;  Surgeon: Rachael Fee, MD;  Location: [facility]  ENDOSCOPY;  Service: Endoscopy;  Laterality: N/A;   FISTULA SUPERFICIALIZATION Right 02/07/2016   Procedure: RIIGHT UPPER ARM FISTULA SUPERFICIALIZATION;  Surgeon: Sherren Kerns, MD;  Location: [facility] OR;  Service: Vascular;  Laterality: Right;   IJ catheter insertion     INSERTION OF DIALYSIS CATHETER Right 02/25/2015   Procedure: INSERTION OF RIGHT INTERNAL JUGULAR DIALYSIS CATHETER;  Surgeon: Nada Libman, MD;  [facility];  Service: Vascular;  Laterality: Right;   KIDNEY TRANSPLANT     LAPAROSCOPIC REPOSITIONING CAPD CATHETER N/A 06/16/2014   Procedure: LAPAROSCOPIC REPOSITIONING CAPD CATHETER;  Surgeon: Axel Filler, MD;  [facility]: [facility];  Service: General;  Laterality: N/A;   LAPAROSCOPY N/A 05/04/2015   Procedure: LAPAROSCOPY DIAGNOSTIC LYSIS OF ADHESIONS;  Surgeon: Abigail Miyamoto, MD;  [facility];  Service: General;  Laterality: N/A;   MINOR REMOVAL OF PERITONEAL DIALYSIS CATHETER N/A 04/28/2015   Procedure:  REMOVAL OF PERITONEAL DIALYSIS CATHETER;  Surgeon: Abigail Miyamoto, MD;  [facility];  Service: General;  Laterality: N/A;   NEPHRECTOMY Left 1974   RENAL BIOPSY Right 2012   REVISON OF ARTERIOVENOUS FISTULA Right 05/26/2015   Procedure: SUPERFICIALIZATION OF ARTERIOVENOUS FISTULA WITH SIDE BRANCH LIGATIONS;  Surgeon: Nada Libman, MD;  Location: [facility];  Service: Vascular;  Laterality: Right;   RIGHT/LEFT HEART CATH AND CORONARY ANGIOGRAPHY N/A 08/22/2021   Procedure: RIGHT/LEFT HEART CATH AND CORONARY ANGIOGRAPHY;  Surgeon: Lennette Bihari, MD;  [facility];  Service: Cardiovascular;  Laterality: N/A;   SKIN CANCER EXCISION     right shoulder   SVT ABLATION N/A 11/23/2016   Procedure: SVT Ablation;  Surgeon: Will Jorja Loa, MD;  Location: [place] CV LAB;  Service: Cardiovascular;  Laterality: N/A;   UMBILICAL HERNIA REPAIR N/A 05/18/2014   Procedure: HERNIA REPAIR UMBILICAL ADULT;  Surgeon: Ralene Ok, MD;  Location: [place];  Service:  General;  Laterality: N/A;   UMBILICAL HERNIA REPAIR N/A 04/28/2015   Procedure: UMBILICAL HERNIA REPAIR WITH MESH;  Surgeon: Coralie Keens, MD;  Location: [place];  Service: General;  Laterality: N/A;     Social History:   reports that he quit smoking about 8 years ago. His smoking use included cigars. He has never used smokeless tobacco. He reports current alcohol use. He reports that he does not use drugs.   Family History:  His family history includes Colon polyps in his father. There is no history of Colon cancer or Stomach cancer.   Allergies Not on File   Home Medications  Prior to Admission medications   Medication Sig Start Date End Date Taking? Authorizing Provider  amLODipine (NORVASC) 10 MG tablet TAKE 1 TABLET BY MOUTH EVERY DAY 09/11/21  Yes Troy Sine, MD  amoxicillin (AMOXIL) 500 MG capsule Take 2,000 mg by mouth See admin instructions. Take 4 capsules (2000 mg) by mouth 1 hour prior to dental appointments. 08/13/21  Yes [provider]  aspirin EC 81 MG tablet Take 81 mg by mouth daily.   Yes [provider]  belatacept (NULOJIX) 250 MG SOLR injection Inject 24.5 mLs into the vein every 28 (twenty-eight) days.  08/30/17  Yes [provider]  cholecalciferol (VITAMIN D3) 25 MCG (1000 UNIT) tablet Take 1,000 Units by mouth in the morning and at bedtime.   Yes [provider]  DULoxetine (CYMBALTA) 60 MG capsule Take 60 mg by mouth daily. 07/19/20  Yes [provider]  gabapentin (NEURONTIN) 100 MG capsule Take 200-300 mg by mouth See admin instructions. Take 2 capsules (200 mg) by mouth in the morning and take 3 capsules (300 mg) by mouth at bedtime 06/02/16  Yes [provider]  HUMULIN R 500 UNIT/ML injection USE MAX 200 UNITS IN PUMP EVERY THREE DAYS 08/14/21  Yes Elayne Snare, MD  insulin aspart (NOVOLOG FLEXPEN) 100 UNIT/ML FlexPen Inject 5-20 Units into the skin 3 (three) times daily as needed for high blood sugar.  Sliding scale 04/19/21  Yes [provider]  levothyroxine (SYNTHROID) 100 MCG tablet TAKE 1 TABLET BY MOUTH EVERY DAY 06/13/21  Yes Elayne Snare, MD  lisinopril (ZESTRIL) 20 MG tablet Take 20 mg by mouth 2 (two) times daily.   Yes [provider]  metoprolol tartrate (LOPRESSOR) 50 MG tablet Take 1 tablet (50 mg total) by mouth 2 (two) times daily. 06/16/21  Yes Troy Sine, MD  mycophenolate (CELLCEPT) 250 MG capsule Take 1,000 mg by mouth every 12 (twelve) hours.    Yes [provider]  predniSONE (DELTASONE) 5 MG tablet Take 5 mg by mouth daily with breakfast.   Yes [provider]  rosuvastatin (CRESTOR) 20 MG tablet Take 20 mg by mouth daily. 07/14/20  Yes [provider]  tamsulosin (FLOMAX) 0.4 MG CAPS capsule Take 0.8 mg by mouth at bedtime.    Yes [provider]  testosterone cypionate (DEPOTESTOSTERONE CYPIONATE) 200 MG/ML injection Inject 0.6 mLs (120 mg total) into the muscle every 7 (seven) days. 07/10/21  Yes Elayne Snare, MD  traMADol (ULTRAM) 50 MG tablet Take 1 tablet (50 mg total) by mouth every 6 (six) hours as needed. Patient taking differently: Take 50 mg by mouth 3 (three) times  daily. 02/07/16  Yes Ulyses Amor, PA-C  traZODone (DESYREL) 50 MG tablet Take 50-100 mg by mouth at bedtime as needed for sleep.  08/18/16  Yes [provider]  [place] at bedtime. BiPAP: At bedtime   Yes [provider]  ACCU-CHEK FASTCLIX LANCETS MISC Use lancets to check blood sugar 5 times daily. 09/19/18   Elayne Snare, MD  B-D UF III MINI PEN NEEDLES 31G X 5 MM MISC USE 4 PER DAY TO INJECT INSULIN. 01/19/21   Elayne Snare, MD  BD INSULIN SYRINGE U/F 31G X 5/16" 1 ML MISC USE 3 TIMES A DAY WITH HUMULIN R 09/09/18   Elayne Snare, MD  Blood Glucose Monitoring Suppl (ACCU-CHEK GUIDE) w/Device KIT 1 each by Does not apply route daily. Use meter to check blood sugar 5 times daily. 09/19/18   Elayne Snare, MD  Blood Glucose  Monitoring Suppl (FIFTY50 GLUCOSE METER 2.0) w/Device KIT Use as directed 12/23/17   [provider]  Continuous Blood Gluc Sensor (FREESTYLE LIBRE 3 SENSOR) MISC 1 each by Does not apply route every 14 (fourteen) days. Place 1 sensor on the skin every 14 days. Use to check glucose continuously 08/08/21   Elayne Snare, MD  fluocinonide cream (LIDEX) 2.24 % Apply 1 application topically daily as needed (Skin inflammation (hand, thigh, back)).     [provider]  furosemide (LASIX) 20 MG tablet Take 20 mg by mouth daily as needed for fluid.    [provider]  glucose blood (ACCU-CHEK GUIDE) test strip 1 each by Other route as needed for other. Use as instructed to check blood sugar 5 times daily. 09/19/18   Elayne Snare, MD  Insulin Syringe-Needle U-100 (BD INSULIN SYRINGE ULTRAFINE) 31G X 5/16" 0.3 ML MISC Use to inject insulin 2-3 times per day 04/23/17   Elayne Snare, MD  lactulose (CHRONULAC) 10 GM/15ML solution Take 10-20 g by mouth daily as needed (constipation). 02/23/15   [provider]  Lancets Misc. (ACCU-CHEK FASTCLIX LANCET) KIT 1 each by Does not apply route daily. 09/19/18   Elayne Snare, MD  NEEDLE, DISP, 25 G (B-D DISP NEEDLE 25GX1") 25G X 1" MISC USE TO INJECT TESTOSTERONE (EXCHANGE NEEDLE FROM 22G 3ML SYRINGE) 12/22/20   Elayne Snare, MD  senna-docusate (SENOKOT-S) 8.6-50 MG tablet Take 1 tablet by mouth as needed for mild constipation.    [provider]  Syringe, Disposable, (SYRINGE LUER LOCK) 3 ML MISC Use 1 syringe once weekly to inject Testosterone medication. 04/08/20   Elayne Snare, MD  zolpidem (AMBIEN) 10 MG tablet Take 10 mg by mouth at bedtime as needed for sleep.    [provider]     Critical care time: 48 minutes    CRITICAL CARE Performed by: Cristal Generous   Total critical care time: 48 minutes  Critical care time was exclusive of separately billable procedures and treating other patients.  Critical care was  necessary to treat or prevent imminent or life-threatening deterioration.  Critical care was time spent personally by me on the following activities: development of treatment plan with patient and/or surrogate as well as nursing, discussions with consultants, evaluation of patient's response to treatment, examination of patient, obtaining history from patient or surrogate, ordering and performing treatments and interventions, ordering and review of laboratory studies, ordering and review of radiographic studies, pulse oximetry and re-evaluation of patient's condition.  Eliseo Gum MSN, AGACNP-BC [place] for pager  10/05/2021, 3:10 PM

## 2021-10-05 NOTE — Anesthesia Procedure Notes (Addendum)
Procedure Name: Intubation Date/Time: 10/05/2021 7:49 AM Performed by: Glynda Jaeger, CRNA Pre-anesthesia Checklist: Patient identified, Patient being monitored, Timeout performed, Emergency Drugs available and Suction available Patient Re-evaluated:Patient Re-evaluated prior to induction Oxygen Delivery Method: Circle System Utilized Preoxygenation: Pre-oxygenation with 100% oxygen Induction Type: IV induction Ventilation: Two handed mask ventilation required Laryngoscope Size: 3 and Glidescope Grade View: Grade I Tube type: Oral Tube size: 8.0 mm Number of attempts: 1 Airway Equipment and Method: Video-laryngoscopy Placement Confirmation: ETT inserted through vocal cords under direct vision, positive ETCO2 and breath sounds checked- equal and bilateral Secured at: 22 cm Tube secured with: Tape Dental Injury: Teeth and Oropharynx as per pre-operative assessment

## 2021-10-05 NOTE — Progress Notes (Signed)
Called by nursing to assess persistent cuff leak.  Upon arrival to room patient had minimal expiratory volumes and severe, audible leak that did not resolve with added air to cuff.  ETT placement did not change from 24 at any point, however patient did have a period of strong coughing.  Positive color change on easy cap.  Dr. Elsworth Soho assisted via camera, and suggested deflating cuff and repositioning of the ETT in case of tube coiling. Tube now at 25.  This seemed to resolve the leak.  Vent changes were made as well, RR to 24, Peep/FiO2 to 10+/50%. CXR obtained, results pending. Sats 100% throughout.  RN at bedside.

## 2021-10-05 NOTE — Progress Notes (Signed)
Pharmacy Antibiotic Note  Robert [name] is a 60 y.o. male admitted on 10/05/2021 for CABG. Pharmacy has been consulted for cefepime dosing given pt is immunocompromised.  Plan: Cefepime 2g IV q12h   Height: 5\' 11"  (180.3 cm) Weight: 133.1 kg (293 lb 8 oz) IBW/kg (Calculated) : 75.3  Temp (24hrs), Avg:97.8 F (36.6 C), Min:97.2 F (36.2 C), Max:98.4 F (36.9 C)  Recent Labs  Lab 10/03/21 1120 10/05/21 0550 10/05/21 0756 10/05/21 0952 10/05/21 1048 10/05/21 1148 10/05/21 1307  WBC 13.9*  --   --   --   --   --   --   CREATININE 2.30*   < > 2.00* 2.10* 2.20* 2.20* 2.20*   < > = values in this interval not displayed.    Estimated Creatinine Clearance: 48.5 mL/min (A) (by C-G formula based on SCr of 2.2 mg/dL (H)).    Not on File    Arrie Senate, PharmD, [place], [facility] Clinical Pharmacist [phone] Please check AMION for all [place] numbers 10/05/2021

## 2021-10-05 NOTE — Anesthesia Procedure Notes (Signed)
Arterial Line Insertion Start/End12/29/2022 6:45 AM, 10/05/2021 6:50 AM Performed by: Glynda Jaeger, CRNA, CRNA  Preanesthetic checklist: patient identified, IV checked, site marked, risks and benefits discussed, surgical consent, monitors and equipment checked, pre-op evaluation, timeout performed and anesthesia consent Patient sedated Left, radial was placed Catheter size: 20 G Hand hygiene performed  and maximum sterile barriers used  Allen's test indicative of satisfactory collateral circulation Attempts: 1 Procedure performed without using ultrasound guided technique. Following insertion, dressing applied and Biopatch. Post procedure assessment: normal  Patient tolerated the procedure well with no immediate complications.

## 2021-10-05 NOTE — Interval H&P Note (Signed)
History and Physical Interval Note:  10/05/2021 7:06 AM  Robert [name]  has presented today for surgery, with the diagnosis of CAD AS.  The various methods of treatment have been discussed with the patient and family. After consideration of risks, benefits and other options for treatment, the patient has consented to  Procedure(s): CORONARY ARTERY BYPASS GRAFTING (CABG) (N/A) AORTIC VALVE REPLACEMENT (AVR) (N/A) TRANSESOPHAGEAL ECHOCARDIOGRAM (TEE) (N/A) as a surgical intervention.  The patient's history has been reviewed, patient examined, no change in status, stable for surgery.  I have reviewed the patient's chart and labs.  Questions were answered to the patient's satisfaction.     [name]

## 2021-10-05 NOTE — Anesthesia Procedure Notes (Signed)
Central Venous Catheter Insertion Performed by: Catalina Gravel, MD, anesthesiologist Start/End12/29/2022 6:45 AM, 10/05/2021 7:05 AM Patient location: Pre-op. Preanesthetic checklist: patient identified, IV checked, site marked, risks and benefits discussed, surgical consent, monitors and equipment checked, pre-op evaluation, timeout performed and anesthesia consent Position: Trendelenburg Lidocaine 1% used for infiltration and patient sedated Hand hygiene performed , maximum sterile barriers used  and Seldinger technique used Catheter size: 9 Fr Central line was placed.MAC introducer Procedure performed using ultrasound guided technique. Ultrasound Notes:anatomy identified, needle tip was noted to be adjacent to the nerve/plexus identified, no ultrasound evidence of intravascular and/or intraneural injection and image(s) printed for medical record Attempts: 1 Following insertion, line sutured, dressing applied and Biopatch. Post procedure assessment: free fluid flow, blood return through all ports and no air  Patient tolerated the procedure well with no immediate complications. Additional procedure comments: Attempt x1 on right side, unable to advance wire past 15cm.  Attempt x1 on left with success.Marland Kitchen

## 2021-10-05 NOTE — Anesthesia Procedure Notes (Signed)
Central Venous Catheter Insertion Performed by: Catalina Gravel, MD, anesthesiologist Start/End12/29/2022 7:05 AM, 10/05/2021 7:15 AM Patient location: Pre-op. Preanesthetic checklist: patient identified, IV checked, site marked, risks and benefits discussed, surgical consent, monitors and equipment checked, pre-op evaluation and timeout performed Position: Trendelenburg Hand hygiene performed  and maximum sterile barriers used  Total catheter length 100. PA cath was placed.Swan type:thermodilution PA Cath depth:58 Procedure performed without using ultrasound guided technique. Attempts: 1 Patient tolerated the procedure well with no immediate complications.

## 2021-10-05 NOTE — Progress Notes (Signed)
Patient was transported on the ventilator from the OR to room 2H08 with no problems.

## 2021-10-05 NOTE — Progress Notes (Signed)
Patient ID: Robert [name], male   DOB: 06-Nov-1960, 60 y.o.   MRN: [id number]  TCTS Evening Rounds:   Hemodynamically labile on milrinone 0.25, epi 7, NE 20.  CI = 3.5, MAP 60's   Sedated on vent  Urine output 20/hr CT output low  CBC    Component Value Date/Time   WBC 22.3 (H) 10/05/2021 1455   RBC 4.50 10/05/2021 1455   HGB 12.2 (L) 10/05/2021 1701   HGB 15.1 08/18/2021 0953   HCT 36.0 (L) 10/05/2021 1701   HCT 45.8 08/18/2021 0953   PLT 223 10/05/2021 1455   PLT 279 08/18/2021 0953   MCV 91.3 10/05/2021 1455   MCV 84 08/18/2021 0953   MCH 29.3 10/05/2021 1455   MCHC 32.1 10/05/2021 1455   RDW 15.0 10/05/2021 1455   RDW 14.3 08/18/2021 0953   LYMPHSABS 1.1 08/07/2017 1320   LYMPHSABS 0.9 11/13/2016 0909   MONOABS 0.6 08/07/2017 1320   EOSABS 0.1 08/07/2017 1320   EOSABS 0.2 11/13/2016 0909   BASOSABS 0.0 08/07/2017 1320   BASOSABS 0.0 11/13/2016 0909     BMET    Component Value Date/Time   NA 138 10/05/2021 1701   NA 137 08/18/2021 0953   K 3.9 10/05/2021 1701   CL 106 10/05/2021 1307   CO2 23 10/05/2021 0550   GLUCOSE 154 (H) 10/05/2021 1307   BUN 41 (H) 10/05/2021 1307   BUN 30 (H) 08/18/2021 0953   CREATININE 2.20 (H) 10/05/2021 1307   CALCIUM 8.7 (L) 10/05/2021 0550   CALCIUM 8.5 04/07/2014 1057   EGFR 40 (L) 08/18/2021 0953   GFRNONAA 35 (L) 10/05/2021 0550     A/P:  He has been labile requiring high dose vasopressors to maintain MAP. LVEF normal but he has a hypertrophied and non-compliant ventricle. Sats were difficult to keep up postop and CXR showed left lung asymmetric edema vs atelectasis vs infiltrate. Plan to keep sedated on vent overnight. 6 hr labs pending this evening.

## 2021-10-05 NOTE — Transfer of Care (Signed)
Immediate Anesthesia Transfer of Care Note  Patient: Robert [name]  Procedure(s) Performed: CORONARY ARTERY BYPASS GRAFTING (CABG) X 1, ON PUMP, USING LEFT INTERNAL MAMMARY ARTERY CONDUIT (Chest) AORTIC VALVE REPLACEMENT (AVR) WITH INSPIRIS RESILIA AORTIC VALVE SIZE 27MM (Chest) TRANSESOPHAGEAL ECHOCARDIOGRAM (TEE) APPLICATION OF CELL SAVER  Patient Location: ICU  Anesthesia Type:General  Level of Consciousness: Patient remains intubated per anesthesia plan  Airway & Oxygen Therapy: Patient remains intubated per anesthesia plan and Patient placed on Ventilator (see vital sign flow sheet for setting)  Post-op Assessment: Report given to RN and Post -op Vital signs reviewed and stable  Post vital signs: Reviewed and stable  Last Vitals:  Vitals Value Taken Time  BP    Temp    Pulse 80 10/05/21 1435  Resp 18 10/05/21 1435  SpO2 84 % 10/05/21 1435  Vitals shown include unvalidated device data.  Last Pain:  Vitals:   10/05/21 0605  TempSrc: Oral  PainSc: 0-No pain         Complications: No notable events documented.

## 2021-10-05 NOTE — Progress Notes (Signed)
ETT advanced to 27 per CXR and MD.

## 2021-10-05 NOTE — Plan of Care (Signed)
°  Problem: Pain Managment: Goal: General experience of comfort will improve Outcome: Progressing   Problem: Role Relationship: Goal: Method of communication will improve Outcome: Progressing   Problem: Cardiac: Goal: Will achieve and/or maintain hemodynamic stability Outcome: Progressing

## 2021-10-05 NOTE — Anesthesia Postprocedure Evaluation (Signed)
Anesthesia Post Note  Patient: Robert [name]  Procedure(s) Performed: CORONARY ARTERY BYPASS GRAFTING (CABG) X 1, ON PUMP, USING LEFT INTERNAL MAMMARY ARTERY CONDUIT (Chest) AORTIC VALVE REPLACEMENT (AVR) WITH INSPIRIS RESILIA AORTIC VALVE SIZE 27MM (Chest) TRANSESOPHAGEAL ECHOCARDIOGRAM (TEE) APPLICATION OF CELL SAVER     Patient location during evaluation: SICU Anesthesia Type: General Level of consciousness: sedated Pain management: pain level controlled Vital Signs Assessment: post-procedure vital signs reviewed and stable Respiratory status: patient remains intubated per anesthesia plan Cardiovascular status: unstable (Requiring multiple vasoactive infusions for BP support) Postop Assessment: no apparent nausea or vomiting Anesthetic complications: no   No notable events documented.  Last Vitals:  Vitals:   10/05/21 1600 10/05/21 1700  BP: (!) 149/66 (!) 128/51  Pulse: 86 79  Resp: 17 (!) 30  Temp: (!) 36.1 C (!) 36 C  SpO2: 98% 100%    Last Pain:  Vitals:   10/05/21 1500  TempSrc: Core  PainSc:                  Catalina Gravel

## 2021-10-05 NOTE — Op Note (Signed)
CARDIOVASCULAR SURGERY OPERATIVE NOTE  10/05/2021  Surgeon:  [name], MD  First Assistant: Jadene Pierini,  PA-C:   An experienced assistant was required given the complexity of this surgery and the standard of surgical care. The assistant was needed for exposure, dissection, suctioning, retraction of delicate tissues and sutures, instrument exchange and for overall help during this procedure.   Preoperative Diagnosis:  Severe multi-vessel coronary artery disease and severe aortic stenosis.   Postoperative Diagnosis:  Same   Procedure:  Median Sternotomy Extracorporeal circulation 3.   Coronary artery bypass grafting x 1  Left internal mammary artery graft to the LAD  4.   Aortic valve replacement using a 27 mm Edwards INSPIRIS RESILIA pericardial valve    Anesthesia:  General Endotracheal   Clinical History/Surgical Indication:  The patient is a 60 year old gentleman has stage D, severe, symptomatic aortic stenosis with New York Heart Association class III symptoms due to shortness of breath consistent with chronic diastolic congestive heart failure.  His cardiac catheterization shows severe three-vessel coronary disease 80% of the 90% proximal LAD stenosis, 50% marginal stenosis, and 80% PDA and PL AV groove stenoses.  He has had some exertional discomfort of the chest radiating to the neck and shoulders on both sides.  I agree that aortic valve replacement using a bioprosthetic valve and coronary artery bypass graft surgery is going to be the best treatment to this patient.  His coronary disease is not ideal for PCI especially with calcified ostial and proximal LAD stenoses.  He is also relatively young for a transcatheter aortic valve replacement.  I think his symptoms are likely related to the combination of severe aortic stenosis and significant coronary disease and both problems  that are to be treated.  His operative risk is certainly increased due to his morbid obesity and multiple comorbidities including poorly controlled diabetes and stage IIIb chronic kidney disease status post renal transplantation.he certainly could have worsening of his kidney function.  I discussed the operative procedure with the patient and his wife including alternatives, benefits and risks; including but not limited to bleeding, blood transfusion, infection, stroke, myocardial infarction, graft failure, heart block requiring a permanent pacemaker, organ dysfunction, and death.  Matilde Bash understands and agrees to proceed.    Preparation:  The patient was seen in the preoperative holding area and the correct patient, correct operation were confirmed with the patient after reviewing the medical record and catheterization. The consent was signed by me. Preoperative antibiotics were given. A pulmonary arterial line and radial arterial line were placed by the anesthesia team. The patient was taken back to the operating room and positioned supine on the operating room table. After being placed under general endotracheal anesthesia by the anesthesia team a foley catheter was placed. The neck, chest, abdomen, and both legs were prepped with betadine soap and solution and draped in the usual sterile manner. A surgical time-out was taken and the correct patient and operative procedure were confirmed with the nursing and anesthesia staff.   Cardiopulmonary Bypass:  A median sternotomy was performed. There was a large amount of mediastinal fat. The pericardium was opened in the midline. Right ventricular function appeared normal. The ascending aorta was of normal size and had no palpable plaque. There were no contraindications to aortic cannulation or cross-clamping. The patient was fully systemically heparinized and the ACT was maintained > 400 sec. The proximal aortic arch was cannulated with a 40 F aortic  cannula for arterial inflow. Venous cannulation  was performed via the right atrial appendage using a two-staged venous cannula. An antegrade cardioplegia/vent cannula was inserted into the mid-ascending aorta. Aortic occlusion was performed with a single cross-clamp. Systemic cooling to 32 degrees Centigrade and topical cooling of the heart with iced saline were used. Cold KBC cardioplegia was used to induce diastolic arrest and was then given at about 60 minute intervals throughout the period of arrest to maintain myocardial temperature at or below 10 degrees centigrade. A temperature probe was inserted into the interventricular septum and an insulating pad was placed in the pericardium.   Left internal mammary artery harvest:  The left side of the sternum was retracted using the Rultract retractor. The left internal mammary artery was harvested as a pedicle graft. All side branches were clipped. It was a medium-sized vessel of good quality with excellent blood flow. It was ligated distally and divided. It was sprayed with topical papaverine solution to prevent vasospasm.    Coronary arteries:  The coronary arteries were examined. The heart was encased in a large amount of epicardial fat.  LAD:  medium caliber vessel that was intramyocardial in its proximal and mid portions but visible in the epicardial fat distally. There was no distal disease. LCX:  OM branches were intramyocardial. RCA:  diffusely diseased with small PDA and PL branches that were not graftable.   Grafts:  LIMA to the LAD: 1.75 mm. It was sewn end to side using 8-0 prolene continuous suture.    Aortic Valve Replacement:   A transverse aortotomy was performed 1 cm above the take-off of the right coronary artery. The native valve was trileaflet with calcified leaflets and severe annular calcification. The ostia of the coronary arteries were in normal position and were not obstructed. The native valve leaflets were excised  and the annulus was decalcified with rongeurs. Care was taken to remove all particulate debris. The left ventricle was directly inspected for debris and then irrigated with ice saline solution. The annulus was sized and a size 27 mm Edwards INSPIRIS RESILIA pericardial valve was chosen. The model number was [id number] and the serial number was [id number].  While the valve was being prepared 2-0 Ethibond pledgeted horizontal mattress sutures were placed around the annulus with the pledgets in a sub-annular position. The sutures were placed through the sewing ring and the valve lowered into place. The sutures were tied using CorKnots. The valve seated nicely and the coronary ostia were not obstructed. The prosthetic valve leaflets moved normally and there was no sub-valvular obstruction. The aortotomy was closed using 4-0 Prolene suture in 2 layers with felt strips to reinforce the closure.   Completion:  The patient was rewarmed to 37 degrees Centigrade. A dose of warm reanimation cardioplegia was given retrograde. Deairing maneuvers were performed and the head placed in Trendelenburg position. The clamp was removed from the LIMA pedicle and there was rapid warming of the septum and return of ventricular fibrillation. The crossclamp was removed with a time of 112 minutes. There was spontaneous return of sinus rhythm. The distal anastomosis was checked for hemostasis. The position of the graft was satisfactory. Two temporary epicardial pacing wires were placed on the right atrium and two on the right ventricle. The patient was weaned from CPB on milrinone 0.25 and epi 5 with NE for BP support. CPB time was 147 minutes. Cardiac output was 5 LPM. TEE showed normal function of the aortic valve prosthesis with no AI or paravalvular leak. There was trivial MR. LV systolic  function remained normal. PA pressures remained elevated. We had some difficulty with maintaining oxygen sats and he required recruitment maneuvers. Heparin  was fully reversed with protamine and the aortic and venous cannulas removed. Hemostasis was achieved. Mediastinal and left pleural drainage tubes were placed. The sternum was closed with double #6 stainless steel wires. The fascia was closed with continuous # 1 vicryl suture. The subcutaneous tissue was closed with 2-0 vicryl continuous suture. The skin was closed with 3-0 vicryl subcuticular suture. All sponge, needle, and instrument counts were reported correct at the end of the case. Dry sterile dressings were placed over the incisions and around the chest tubes which were connected to pleurevac suction. The patient was then transported to the surgical intensive care unit on the ventilator in critical but stable condition.

## 2021-10-05 NOTE — Progress Notes (Addendum)
eLink Physician-Brief Progress Note Patient Name: Robert [name] DOB: June 06, 1961 MRN: [id number]   Date of Service  10/05/2021  HPI/Events of Note  Audible leak per RT  eICU Interventions  High autoPEEP, reviewed ABG ,dropped rate to 24 -ETT advanced to 25 @ lip, leak resolved , await CXR -drop PEEP to 10, drop FIO2 if tolerates     Intervention Category Major Interventions: Respiratory failure - evaluation and management  Jossiah Smoak V. Jerlene Rockers 10/05/2021, 10:10 PM   CXR - ETT at thoracic inlet Advance by 2 cm to 27 at lip  10:37 PM

## 2021-10-05 NOTE — OR Nursing (Signed)
Pt bilateral arms tucked with eggcrate and gauze padding with bilateral thumbs pointing toward ceiling.  Right arm has a AV fistula present with pulsation in the fistula.

## 2021-10-05 NOTE — Progress Notes (Signed)
°  Echocardiogram Echocardiogram Transesophageal has been performed.  Darlina Sicilian M 10/05/2021, 8:24 AM

## 2021-10-05 NOTE — Brief Op Note (Signed)
10/05/2021  9:32 AM  PATIENT:  Robert [name]  60 y.o. male  PRE-OPERATIVE DIAGNOSIS:  Coronary Artery Disease Aortic Stenosis  POST-OPERATIVE DIAGNOSIS:  Coronary Artery Disease Aortic Stenosis  PROCEDURE:  Procedure(s): CORONARY ARTERY BYPASS GRAFTING (CABG) X 1, ON PUMP, USING LEFT INTERNAL MAMMARY ARTERY CONDUIT (N/A) AORTIC VALVE REPLACEMENT (AVR) WITH INSPIRIS RESILIA AORTIC VALVE SIZE 27MM (N/A) TRANSESOPHAGEAL ECHOCARDIOGRAM (TEE) (N/A) APPLICATION OF CELL SAVER  SURGEON:  Surgeon(s) and Role:    * Bartle, Fernande Boyden, MD - Primary  PHYSICIAN ASSISTANT: Deamber Buckhalter PA-C  ASSISTANTS: STAFF   ANESTHESIA:   general  EBL:  494 mL   BLOOD ADMINISTERED:none  DRAINS:  LEFT PLEURAL AND MEDIASTINAL CHEST DRAINS    LOCAL MEDICATIONS USED:  NONE  SPECIMEN:  Source of Specimen:   AORTIC VALVE LEAFLETS   DISPOSITION OF SPECIMEN:  PATHOLOGY  COUNTS:  YES  TOURNIQUET:  * No tourniquets in log *  DICTATION: .Dragon Dictation  PLAN OF CARE: Admit to inpatient   PATIENT DISPOSITION:  ICU - intubated and hemodynamically stable.   Delay start of Pharmacological VTE agent (>24hrs) due to surgical blood loss or risk of bleeding: yes  COMPLICATIONS: NO KNOWN

## 2021-10-06 ENCOUNTER — Encounter (HOSPITAL_COMMUNITY): Payer: Self-pay | Admitting: Surgery

## 2021-10-06 ENCOUNTER — Inpatient Hospital Stay (HOSPITAL_COMMUNITY): Payer: 59

## 2021-10-06 DIAGNOSIS — Z952 Presence of prosthetic heart valve: Secondary | ICD-10-CM | POA: Diagnosis not present

## 2021-10-06 LAB — CBC
HCT: 36.5 % — ABNORMAL LOW (ref 39.0–52.0)
HCT: 36.1 % — ABNORMAL LOW (ref 39.0–52.0)
Hemoglobin: 12 g/dL — ABNORMAL LOW (ref 13.0–17.0)
Hemoglobin: 11.8 g/dL — ABNORMAL LOW (ref 13.0–17.0)
MCH: 29.5 pg (ref 26.0–34.0)
MCH: 29.4 pg (ref 26.0–34.0)
MCHC: 32.9 g/dL (ref 30.0–36.0)
MCHC: 32.7 g/dL (ref 30.0–36.0)
MCV: 89.7 fL (ref 80.0–100.0)
MCV: 89.8 fL (ref 80.0–100.0)
Platelets: 213 10*3/uL (ref 150–400)
Platelets: 185 10*3/uL (ref 150–400)
RBC: 4.07 MIL/uL — ABNORMAL LOW (ref 4.22–5.81)
RBC: 4.02 MIL/uL — ABNORMAL LOW (ref 4.22–5.81)
RDW: 15 % (ref 11.5–15.5)
RDW: 15 % (ref 11.5–15.5)
WBC: 16.9 10*3/uL — ABNORMAL HIGH (ref 4.0–10.5)
WBC: 17.8 10*3/uL — ABNORMAL HIGH (ref 4.0–10.5)
nRBC: 0 % (ref 0.0–0.2)
nRBC: 0 % (ref 0.0–0.2)

## 2021-10-06 LAB — POCT I-STAT 7, (LYTES, BLD GAS, ICA,H+H)
Acid-base deficit: 2 mmol/L (ref 0.0–2.0)
Acid-base deficit: 6 mmol/L — ABNORMAL HIGH (ref 0.0–2.0)
Bicarbonate: 21.4 mmol/L (ref 20.0–28.0)
Bicarbonate: 18 mmol/L — ABNORMAL LOW (ref 20.0–28.0)
Calcium, Ion: 1.08 mmol/L — ABNORMAL LOW (ref 1.15–1.40)
Calcium, Ion: 1.04 mmol/L — ABNORMAL LOW (ref 1.15–1.40)
HCT: 33 % — ABNORMAL LOW (ref 39.0–52.0)
HCT: 29 % — ABNORMAL LOW (ref 39.0–52.0)
Hemoglobin: 11.2 g/dL — ABNORMAL LOW (ref 13.0–17.0)
Hemoglobin: 9.9 g/dL — ABNORMAL LOW (ref 13.0–17.0)
O2 Saturation: 98 %
O2 Saturation: 92 %
Patient temperature: 38
Patient temperature: 37.3
Potassium: 3.5 mmol/L (ref 3.5–5.1)
Potassium: 3.1 mmol/L — ABNORMAL LOW (ref 3.5–5.1)
Sodium: 140 mmol/L (ref 135–145)
Sodium: 141 mmol/L (ref 135–145)
TCO2: 22 mmol/L (ref 22–32)
TCO2: 19 mmol/L — ABNORMAL LOW (ref 22–32)
pCO2 arterial: 34 mmHg (ref 32.0–48.0)
pCO2 arterial: 28.7 mmHg — ABNORMAL LOW (ref 32.0–48.0)
pH, Arterial: 7.411 (ref 7.350–7.450)
pH, Arterial: 7.407 (ref 7.350–7.450)
pO2, Arterial: 111 mmHg — ABNORMAL HIGH (ref 83.0–108.0)
pO2, Arterial: 63 mmHg — ABNORMAL LOW (ref 83.0–108.0)

## 2021-10-06 LAB — BASIC METABOLIC PANEL
Anion gap: 8 (ref 5–15)
Anion gap: 12 (ref 5–15)
BUN: 46 mg/dL — ABNORMAL HIGH (ref 8–23)
BUN: 43 mg/dL — ABNORMAL HIGH (ref 8–23)
CO2: 22 mmol/L (ref 22–32)
CO2: 21 mmol/L — ABNORMAL LOW (ref 22–32)
Calcium: 7.7 mg/dL — ABNORMAL LOW (ref 8.9–10.3)
Calcium: 7.5 mg/dL — ABNORMAL LOW (ref 8.9–10.3)
Chloride: 107 mmol/L (ref 98–111)
Chloride: 102 mmol/L (ref 98–111)
Creatinine, Ser: 2.77 mg/dL — ABNORMAL HIGH (ref 0.61–1.24)
Creatinine, Ser: 2.99 mg/dL — ABNORMAL HIGH (ref 0.61–1.24)
GFR, Estimated: 25 mL/min — ABNORMAL LOW (ref >60)
GFR, Estimated: 23 mL/min — ABNORMAL LOW (ref >60)
Glucose, Bld: 119 mg/dL — ABNORMAL HIGH (ref 70–99)
Glucose, Bld: 126 mg/dL — ABNORMAL HIGH (ref 70–99)
Potassium: 3.8 mmol/L (ref 3.5–5.1)
Potassium: 4.1 mmol/L (ref 3.5–5.1)
Sodium: 137 mmol/L (ref 135–145)
Sodium: 135 mmol/L (ref 135–145)

## 2021-10-06 LAB — GLUCOSE, CAPILLARY
Glucose-Capillary: 102 mg/dL — ABNORMAL HIGH (ref 70–99)
Glucose-Capillary: 110 mg/dL — ABNORMAL HIGH (ref 70–99)
Glucose-Capillary: 112 mg/dL — ABNORMAL HIGH (ref 70–99)
Glucose-Capillary: 115 mg/dL — ABNORMAL HIGH (ref 70–99)
Glucose-Capillary: 117 mg/dL — ABNORMAL HIGH (ref 70–99)
Glucose-Capillary: 118 mg/dL — ABNORMAL HIGH (ref 70–99)
Glucose-Capillary: 123 mg/dL — ABNORMAL HIGH (ref 70–99)
Glucose-Capillary: 124 mg/dL — ABNORMAL HIGH (ref 70–99)
Glucose-Capillary: 131 mg/dL — ABNORMAL HIGH (ref 70–99)
Glucose-Capillary: 135 mg/dL — ABNORMAL HIGH (ref 70–99)
Glucose-Capillary: 142 mg/dL — ABNORMAL HIGH (ref 70–99)
Glucose-Capillary: 184 mg/dL — ABNORMAL HIGH (ref 70–99)
Glucose-Capillary: 74 mg/dL (ref 70–99)
Glucose-Capillary: 97 mg/dL (ref 70–99)

## 2021-10-06 LAB — URINALYSIS, ROUTINE W REFLEX MICROSCOPIC
Bilirubin Urine: NEGATIVE
Glucose, UA: NEGATIVE mg/dL
Ketones, ur: NEGATIVE mg/dL
Nitrite: NEGATIVE
Protein, ur: 100 mg/dL — AB
RBC / HPF: >50 RBC/hpf — ABNORMAL HIGH (ref 0–5)
Specific Gravity, Urine: 1.019 (ref 1.005–1.030)
pH: 5 (ref 5.0–8.0)

## 2021-10-06 LAB — SURGICAL PATHOLOGY

## 2021-10-06 LAB — MAGNESIUM
Magnesium: 2.3 mg/dL (ref 1.7–2.4)
Magnesium: 2.1 mg/dL (ref 1.7–2.4)

## 2021-10-06 MED ORDER — TRAMADOL HCL 50 MG PO TABS
50.0000 mg | ORAL_TABLET | Freq: Four times a day (QID) | ORAL | Status: DC | PRN
  Administered 2021-10-07 – 2021-10-17 (×2): 50 mg via ORAL
  Filled 2021-10-06 (×2): qty 1

## 2021-10-06 MED ORDER — GABAPENTIN 100 MG PO CAPS
200.0000 mg | ORAL_CAPSULE | Freq: Every day | ORAL | Status: DC
  Administered 2021-10-07 – 2021-10-18 (×12): 200 mg via ORAL
  Filled 2021-10-06 (×12): qty 2

## 2021-10-06 MED ORDER — POTASSIUM CHLORIDE CRYS ER 20 MEQ PO TBCR
40.0000 meq | EXTENDED_RELEASE_TABLET | Freq: Once | ORAL | Status: AC
  Administered 2021-10-06: 12:00:00 40 meq via ORAL
  Filled 2021-10-06: qty 2

## 2021-10-06 MED ORDER — ENOXAPARIN SODIUM 40 MG/0.4ML IJ SOSY
40.0000 mg | PREFILLED_SYRINGE | Freq: Every day | INTRAMUSCULAR | Status: DC
  Administered 2021-10-06 – 2021-10-13 (×8): 40 mg via SUBCUTANEOUS
  Filled 2021-10-06 (×8): qty 0.4

## 2021-10-06 MED ORDER — SODIUM CHLORIDE 0.9 % IV SOLN
2.0000 g | Freq: Two times a day (BID) | INTRAVENOUS | Status: AC
  Administered 2021-10-06 (×2): 2 g via INTRAVENOUS
  Filled 2021-10-06 (×2): qty 2

## 2021-10-06 MED ORDER — ORAL CARE MOUTH RINSE
15.0000 mL | Freq: Two times a day (BID) | OROMUCOSAL | Status: DC
  Administered 2021-10-06 – 2021-10-18 (×25): 15 mL via OROMUCOSAL

## 2021-10-06 MED ORDER — ENOXAPARIN SODIUM 40 MG/0.4ML IJ SOSY: 40.0000 mg | PREFILLED_SYRINGE | Freq: Every day | INTRAMUSCULAR | Status: DC

## 2021-10-06 MED ORDER — INSULIN DETEMIR 100 UNIT/ML ~~LOC~~ SOLN
4.0000 [IU] | Freq: Once | SUBCUTANEOUS | Status: DC
  Filled 2021-10-06: qty 0.04

## 2021-10-06 MED ORDER — INSULIN DETEMIR 100 UNIT/ML ~~LOC~~ SOLN
30.0000 [IU] | Freq: Two times a day (BID) | SUBCUTANEOUS | Status: DC
  Administered 2021-10-06 – 2021-10-14 (×17): 30 [IU] via SUBCUTANEOUS
  Filled 2021-10-06 (×19): qty 0.3

## 2021-10-06 MED ORDER — GABAPENTIN 300 MG PO CAPS
300.0000 mg | ORAL_CAPSULE | Freq: Every day | ORAL | Status: DC
  Administered 2021-10-06 – 2021-10-17 (×12): 300 mg via ORAL
  Filled 2021-10-06 (×13): qty 1

## 2021-10-06 MED ORDER — INSULIN DETEMIR 100 UNIT/ML ~~LOC~~ SOLN: 8.0000 [IU] | Freq: Every day | SUBCUTANEOUS | Status: DC

## 2021-10-06 MED ORDER — SODIUM BICARBONATE 8.4 % IV SOLN: 50.0000 meq | Freq: Once | INTRAVENOUS | Status: AC

## 2021-10-06 MED ORDER — INSULIN ASPART 100 UNIT/ML IJ SOLN
0.0000 [IU] | INTRAMUSCULAR | Status: DC
  Administered 2021-10-06 (×2): 2 [IU] via SUBCUTANEOUS
  Administered 2021-10-06: 20:00:00 4 [IU] via SUBCUTANEOUS

## 2021-10-06 MED ORDER — SODIUM BICARBONATE 8.4 % IV SOLN
INTRAVENOUS | Status: AC
  Administered 2021-10-06: 11:00:00 50 meq via INTRAVENOUS
  Filled 2021-10-06: qty 50

## 2021-10-06 MED ORDER — LEVOTHYROXINE SODIUM 100 MCG PO TABS
100.0000 ug | ORAL_TABLET | Freq: Every day | ORAL | Status: DC
  Administered 2021-10-07: 100 ug
  Filled 2021-10-06: qty 1

## 2021-10-06 MED FILL — Thrombin (Recombinant) For Soln 20000 Unit: CUTANEOUS | Qty: 1 | Status: AC

## 2021-10-06 NOTE — Progress Notes (Signed)
NAME:  Robert [name], MRN:  [id number], DOB:  01-06-1961, LOS: 1 ADMISSION DATE:  10/05/2021, CONSULTATION DATE:  10/05/21 REFERRING MD:  Cyndia Bent - CVTS, CHIEF COMPLAINT: ventilator management  s/p AVR and LAD bypass   History of Present Illness:  60 yo M PMH morbid obesity, CAD, DM2 CKD IIIb, s/p nephrectomy and subsequent renal transplant in 2018, pHTN, severe AS presented 12/29 for AVR and LAD bypass. Admitted to CVTS service. Intra op -- 147 min on pump, 111 min clamp. About 500cc EBL, got about 826ml cell saver. Made 450 ml UOP Remains intubated postoperatively, PCCM consulted in this setting   Pertinent  Medical History  Morbid obesity CKD III DM2 CAD AS S/p kidney transplant  pHTN  Significant Hospital Events: Including procedures, antibiotic start and stop dates in addition to other pertinent events   12/29 AVR, LAD bypass. ICU intubated postop. Asymmetric ASD on CXR -- edema vs PNA. Started on cefepime  12/30 decr vent support   Interim History / Subjective:  ETT adv 1 cm overnight 2/2 cuff leak  This morning have been able to wean vent support -- FiO2 45%   Weaning Epi  and levo gtt. Remains on stable milrinone   Making about 0.48ml/hg/hr UOP  Objective   Blood pressure (!) 138/57, pulse 93, temperature (!) 100.4 F (38 C), temperature source Core (Comment), resp. rate (!) 23, height 5\' 11"  (1.803 m), weight (!) 140.9 kg, SpO2 100 %. PAP: (36-51)/(20-31) 40/21 CO:  [6.2 L/min-14.2 L/min] 8.8 L/min CI:  [2.5 L/min/m2-5.7 L/min/m2] 3.6 L/min/m2  Vent Mode: Stand-by FiO2 (%):  [40 %-100 %] 45 % Set Rate:  [18 bmp-30 bmp] 23 bmp Vt Set:  [600 mL] 600 mL PEEP:  [8 cmH20-16 cmH20] 8 cmH20 Pressure Support:  [10 cmH20] 10 cmH20 Plateau Pressure:  [19 cmH20-30 cmH20] 21 cmH20   Intake/Output Summary (Last 24 hours) at 10/06/2021 0857 Last data filed at 10/06/2021 0800 Gross per 24 hour  Intake 7277.26 ml  Output 1730 ml  Net 5547.26 ml   Filed Weights    10/05/21 0605 10/06/21 0516  Weight: 133.1 kg (!) 140.9 kg    Examination: General: Chronically and critically ill middle aged M intubated lighted sedated NAD  HENT: NCAT pink mm anicteric sclera  Lungs: CTAb even and unlabored  Cardiovascular: rr. Chest tubes. Cap refill brisk  Abdomen: Obese soft ndnt  Extremities: No acute joint deformity no cyanosis or clubbing  Neuro: Awakens to voice following commands no focal deficit  GU: foley, yellow urine   Resolved Hospital Problem list     Assessment & Plan:   Post operative ventilator management Hx OSA on CPAP -post op CXR with infiltrates (PNA vs asymmetric edema), suctioning tan secretions. PCT < 0.5 -- favor against PNA  -CXR improved 12/30 compared to initial post op imaging  P -SBT. Hopefully extubate this morning  -Will need noct CPAP  -dc cefepime   Cardiogenic shock  -likely vasoplegia after pump run -possible slight hypovolemic component  P -weaning pressors -- wean epi first  -cont milrinone 0.25  Severe AS s/p AVR CAD s/p CABG x1 (LAD bypass)  -op date 12/29  P -post op per CVTS -chest tube per CVTS  -mobility pathway per CVTS  AKI on CKD IIIb S/p renal transplant -home cellcept, pred P -trend renal indices -foley, strict UOP  -resume home cellcept, pred   DM P -insulin gtt as per CVTS pathway   Hypothyroidism P -start synthroid 12/31 AM   Best  Practice (right click and "Reselect all SmartList Selections" daily)   Diet/type: NPO DVT prophylaxis: not indicated GI prophylaxis: PPI Lines: Central line arterial line Foley:  Yes, and it is still needed Code Status:  full code Last date of multidisciplinary goals of care discussion [per primary]  Labs   CBC: Recent Labs  Lab 10/03/21 1120 10/05/21 0756 10/05/21 1145 10/05/21 1148 10/05/21 1455 10/05/21 1500 10/05/21 1801 10/05/21 2032 10/05/21 2107 10/06/21 0330 10/06/21 0345  WBC 13.9*  --   --   --  22.3*  --   --   --  20.4*  16.9*  --   HGB 15.7   < > 11.1*   < > 13.2   < > 12.2* 12.2* 12.0* 12.0* 11.2*  HCT 49.2   < > 33.6*   < > 41.1   < > 36.0* 36.0* 36.8* 36.5* 33.0*  MCV 92.5  --   --   --  91.3  --   --   --  90.0 89.7  --   PLT 215  --  180  --  223  --   --   --  228 213  --    < > = values in this interval not displayed.    Basic Metabolic Panel: Recent Labs  Lab 10/03/21 1120 10/05/21 0538 10/05/21 0550 10/05/21 0756 10/05/21 1048 10/05/21 1122 10/05/21 1148 10/05/21 1218 10/05/21 1307 10/05/21 1500 10/05/21 1801 10/05/21 2032 10/05/21 2107 10/06/21 0330 10/06/21 0345  NA 133*  --  131*   < > 135   < > 135   < > 137   < > 137 139 136 137 140  K 5.1  --  3.7   < > 5.2*   < > 6.1*   < > 4.9   < > 3.9 4.0 3.9 3.8 3.5  CL 104  --  100   < > 107  --  106  --  106  --   --   --  104 107  --   CO2 20*  --  23  --   --   --   --   --   --   --   --   --  21* 22  --   GLUCOSE 325*  --  159*   < > 119*  --  123*  --  154*  --   --   --  173* 119*  --   BUN 34*  --  41*   < > 37*  --  39*  --  41*  --   --   --  44* 46*  --   CREATININE 2.30*  --  2.10*   < > 2.20*  --  2.20*  --  2.20*  --   --   --  2.61* 2.77*  --   CALCIUM 9.6  --  8.7*  --   --   --   --   --   --   --   --   --  7.5* 7.7*  --   MG  --  2.5*  --   --   --   --   --   --   --   --   --   --  2.3 2.3  --    < > = values in this interval not displayed.   GFR: Estimated Creatinine Clearance: 39.7 mL/min (A) (by C-G formula based on SCr of 2.77 mg/dL (  H)). Recent Labs  Lab 10/03/21 1120 10/05/21 0538 10/05/21 1455 10/05/21 2107 10/06/21 0330  PROCALCITON  --  0.42  --   --   --   WBC 13.9*  --  22.3* 20.4* 16.9*    Liver Function Tests: Recent Labs  Lab 10/03/21 1120  AST 22  ALT 22  ALKPHOS 59  BILITOT 2.8*  PROT 5.7*  ALBUMIN 3.6   No results for input(s): LIPASE, AMYLASE in the last 168 hours. No results for input(s): AMMONIA in the last 168 hours.  ABG    Component Value Date/Time   PHART 7.411  10/06/2021 0345   PCO2ART 34.0 10/06/2021 0345   PO2ART 111 (H) 10/06/2021 0345   HCO3 21.4 10/06/2021 0345   TCO2 22 10/06/2021 0345   ACIDBASEDEF 2.0 10/06/2021 0345   O2SAT 98.0 10/06/2021 0345     Coagulation Profile: Recent Labs  Lab 10/03/21 1120 10/05/21 1455  INR 1.0 1.4*    Cardiac Enzymes: No results for input(s): CKTOTAL, CKMB, CKMBINDEX, TROPONINI in the last 168 hours.  HbA1C: Hemoglobin A1C  Date/Time Value Ref Range Status  07/10/2021 01:49 PM 8.1 (A) 4.0 - 5.6 % Final  07/07/2019 12:00 AM 6.4  Final   Hgb A1c MFr Bld  Date/Time Value Ref Range Status  10/03/2021 11:20 AM 7.1 (H) 4.8 - 5.6 % Final    Comment:    (NOTE) Pre diabetes:          5.7%-6.4%  Diabetes:              >6.4%  Glycemic control for   <7.0% adults with diabetes   03/23/2021 08:53 AM 7.5 (H) 4.6 - 6.5 % Final    Comment:    Glycemic Control Guidelines for People with Diabetes:Non Diabetic:  <6%Goal of Therapy: <7%Additional Action Suggested:  >8%     CBG: Recent Labs  Lab 10/06/21 0258 10/06/21 0357 10/06/21 0551 10/06/21 0642 10/06/21 0801  GLUCAP 115* 118* 97 102* 110*   CRITICAL CARE Performed by: Cristal Generous   Total critical care time: 48 minutes  Critical care time was exclusive of separately billable procedures and treating other patients. Critical care was necessary to treat or prevent imminent or life-threatening deterioration.  Critical care was time spent personally by me on the following activities: development of treatment plan with patient and/or surrogate as well as nursing, discussions with consultants, evaluation of patient's response to treatment, examination of patient, obtaining history from patient or surrogate, ordering and performing treatments and interventions, ordering and review of laboratory studies, ordering and review of radiographic studies, pulse oximetry and re-evaluation of patient's condition.  Eliseo Gum MSN, AGACNP-BC [place] for pager  10/06/2021, 8:57 AM

## 2021-10-06 NOTE — Care Management (Signed)
°  Transition of Care [facility]) Screening Note   Patient Details  Name: Robert [name] Date of Birth: 1961/09/11   Transition of Care [facility]) CM/SW Contact:    Bethena Roys, RN Phone Number: 10/06/2021, 1:10 PM    Transition of Care Department [facility]) has reviewed patient and no TOC needs have been identified at this time. We will continue to monitor patient advancement through interdisciplinary progression rounds. If new patient transition needs arise, please place a TOC consult.

## 2021-10-06 NOTE — Progress Notes (Addendum)
TCTS DAILY ICU PROGRESS NOTE                   [address]            [place] [address]          [phone]   1 Day Post-Op Procedure(s) (LRB): CORONARY ARTERY BYPASS GRAFTING (CABG) X 1, ON PUMP, USING LEFT INTERNAL MAMMARY ARTERY CONDUIT (N/A) AORTIC VALVE REPLACEMENT (AVR) WITH INSPIRIS RESILIA AORTIC VALVE SIZE 27MM (N/A) TRANSESOPHAGEAL ECHOCARDIOGRAM (TEE) (N/A) APPLICATION OF CELL SAVER  Total Length of Stay:  LOS: 1 day   Subjective: Patient remains intubated, less sedated. Weaning vent per pulmonary/CCM as able  Objective: Vital signs in last 24 hours: Temp:  [96.8 F (36 C)-100.8 F (38.2 C)] 100.6 F (38.1 C) (12/30 0715) Pulse Rate:  [79-90] 87 (12/30 0715) Cardiac Rhythm: Normal sinus rhythm (12/30 0040) Resp:  [17-30] 23 (12/30 0715) BP: (109-149)/(43-66) 109/50 (12/30 0700) SpO2:  [90 %-100 %] 100 % (12/30 0715) Arterial Line BP: (106-161)/(42-73) 158/46 (12/30 0715) FiO2 (%):  [40 %-100 %] 45 % (12/30 0712) Weight:  [140.9 kg] 140.9 kg (12/30 0516)  Filed Weights   10/05/21 0605 10/06/21 0516  Weight: 133.1 kg (!) 140.9 kg    Weight change: 7.769 kg   Hemodynamic parameters for last 24 hours: PAP: (36-51)/(20-31) 43/23 CO:  [6.2 L/min-14.2 L/min] 7.6 L/min CI:  [2.5 L/min/m2-5.7 L/min/m2] 3.1 L/min/m2  Intake/Output from previous day: 12/29 0701 - 12/30 0700 In: 7548.9 [I.V.:4608.6; Blood:780; NG/GT:110; IV Piggyback:2050.3] Out: 1915 [Urine:1001; Blood:494; Chest Tube:420]  Intake/Output this shift: No intake/output data recorded.  Current Meds: Scheduled Meds:  acetaminophen  1,000 mg Oral Q6H   Or   acetaminophen (TYLENOL) oral liquid 160 mg/5 mL  1,000 mg Per Tube Q6H   aspirin EC  325 mg Oral Daily   Or   aspirin  324 mg Per Tube Daily   bisacodyl  10 mg Oral Daily   Or   bisacodyl  10 mg Rectal Daily   chlorhexidine gluconate (MEDLINE KIT)  15 mL Mouth Rinse BID   Chlorhexidine Gluconate Cloth  6 each Topical  Daily   docusate  100 mg Per Tube BID   DULoxetine  60 mg Oral Daily   mouth rinse  15 mL Mouth Rinse 10 times per day   metoprolol tartrate  12.5 mg Oral BID   Or   metoprolol tartrate  12.5 mg Per Tube BID   mycophenolate  1,000 mg Oral Q12H   pantoprazole (PROTONIX) IV  40 mg Intravenous QHS   polyethylene glycol  17 g Per Tube Daily   predniSONE  5 mg Oral Q breakfast   rosuvastatin  20 mg Oral Daily   sodium chloride flush  10-40 mL Intracatheter Q12H   sodium chloride flush  3 mL Intravenous Q12H   Continuous Infusions:  sodium chloride 20 mL/hr at 10/06/21 0700   sodium chloride     sodium chloride     albumin human Stopped (10/05/21 1728)   ceFEPime (MAXIPIME) IV Stopped (10/05/21 1709)   dexmedetomidine (PRECEDEX) IV infusion 0.7 mcg/kg/hr (10/06/21 0700)   epinephrine 5 mcg/min (10/06/21 0700)   insulin 3.4 Units/hr (10/06/21 0700)   lactated ringers     lactated ringers 20 mL/hr at 10/06/21 0700   magnesium sulfate     milrinone 0.25 mcg/kg/min (10/06/21 0700)   nitroGLYCERIN Stopped (10/05/21 1510)   norepinephrine (LEVOPHED) Adult infusion 8 mcg/min (10/06/21 0700)   phenylephrine (NEO-SYNEPHRINE) Adult  infusion Stopped (10/05/21 1511)   PRN Meds:.sodium chloride, albumin human, dextrose, fentaNYL (SUBLIMAZE) injection, fentaNYL (SUBLIMAZE) injection, metoprolol tartrate, midazolam, morphine injection, ondansetron (ZOFRAN) IV, oxyCODONE, sodium chloride flush, sodium chloride flush, traMADol  General appearance: no distress Neurologic: Able to follow commands Heart: RRR, no murmur Lungs: Diminished bibasilar breath sounds Abdomen: Soft, obese, sporadic bowel sounds Extremities: SCDs in place Wound: Sternal dressing is clean and dry  Lab Results: CBC: Recent Labs    10/05/21 2107 10/06/21 0330 10/06/21 0345  WBC 20.4* 16.9*  --   HGB 12.0* 12.0* 11.2*  HCT 36.8* 36.5* 33.0*  PLT 228 213  --    BMET:  Recent Labs    10/05/21 2107 10/06/21 0330  10/06/21 0345  NA 136 137 140  K 3.9 3.8 3.5  CL 104 107  --   CO2 21* 22  --   GLUCOSE 173* 119*  --   BUN 44* 46*  --   CREATININE 2.61* 2.77*  --   CALCIUM 7.5* 7.7*  --     CMET: Lab Results  Component Value Date   WBC 16.9 (H) 10/06/2021   HGB 11.2 (L) 10/06/2021   HCT 33.0 (L) 10/06/2021   PLT 213 10/06/2021   GLUCOSE 119 (H) 10/06/2021   CHOL 148 07/04/2021   TRIG 54.0 07/04/2021   HDL 69.70 07/04/2021   LDLCALC 67 07/04/2021   ALT 22 10/03/2021   AST 22 10/03/2021   NA 140 10/06/2021   K 3.5 10/06/2021   CL 107 10/06/2021   CREATININE 2.77 (H) 10/06/2021   BUN 46 (H) 10/06/2021   CO2 22 10/06/2021   TSH 1.15 07/04/2021   INR 1.4 (H) 10/05/2021   HGBA1C 7.1 (H) 10/03/2021   MICROALBUR 146.3 (H) 08/03/2019      PT/INR:  Recent Labs    10/05/21 1455  LABPROT 17.2*  INR 1.4*   Radiology: [facility] Chest Port 1 View  Result Date: 10/06/2021 CLINICAL DATA:  Recent AVR and single-vessel CABG. EXAM: PORTABLE CHEST 1 VIEW COMPARISON:  Portable chest yesterday at 10:13 p.m. FINDINGS: 3:24 a.m., 10/06/2021. The swan Ganz catheter has been pulled back into the pulmonary outflow tract. Left chest tube and mediastinal drain positioning are stable. No pneumothorax is seen. NGT enters the stomach but the proximal side hole is just below the hiatus and could be advanced further in 10 cm for better positioning. ETT tube is 3.4 cm from the carina. Sternotomy sutures and aortic valve prosthesis are again shown. The heart is enlarged. Mild central vascular prominence continues to be seen, mild interstitial edema in the bases. There are small pleural effusions, with basilar interstitial haziness which could be atelectasis, pneumonitis or ground-glass edema. The mid and upper lungs show no focal opacities. Overall aeration is unchanged. IMPRESSION: The only noteworthy change is that the Swan-Ganz catheter has been pulled back into the pulmonary outflow tract. There is no interval change in  the lung fields as described above or in other support device placements. Electronically Signed   By: Telford Nab M.D.   On: 10/06/2021 04:55   DG CHEST PORT 1 VIEW  Result Date: 10/05/2021 CLINICAL DATA:  Endotracheal tube placement EXAM: PORTABLE CHEST 1 VIEW COMPARISON:  10/05/2021 FINDINGS: Postoperative changes in the mediastinum. Endotracheal tube is present at the thoracic inlet with tip measuring 8.8 cm above the carina. Enteric tube tip is off the field of view but below the left hemidiaphragm. Left Swan-Ganz catheter with tip over the right pulmonary artery. Mediastinal drains. Left  chest tube. Cardiac enlargement with pulmonary vascular congestion. Mild perihilar edema appears to be improving since previous study. Small bilateral pleural effusions. No pneumothorax. IMPRESSION: Endotracheal tube tip is at the thoracic inlet, measuring 8.8 cm above the carina. Cardiac enlargement with pulmonary vascular congestion. Improving perihilar edema. Small pleural effusions. Electronically Signed   By: Lucienne Capers M.D.   On: 10/05/2021 22:24   DG Chest Port 1 View  Result Date: 10/05/2021 CLINICAL DATA:  Status post aortic valve repair and CABG. EXAM: PORTABLE CHEST 1 VIEW COMPARISON:  10/03/2021 FINDINGS: Postoperative changes from median sternotomy, aortic valve repair and CABG procedure identified. Mediastinal drain and left chest tube are in place. No pneumothorax visualized. Swan-Ganz catheter tip projects over the proximal right main pulmonary artery. There is a nasogastric tube in place with tip and side port below the level of the GE junction. Heart size appears within normal limits. Hazy opacification within the left upper lobe is identified which is favored to represent an area of asymmetric pulmonary edema. IMPRESSION: 1. Satisfactory position of support apparatus. 2. Left upper lobe hazy opacification, favored to represent area of asymmetric pulmonary edema. Electronically Signed   By:  Kerby Moors M.D.   On: 10/05/2021 15:02   DG Abd Portable 1V  Result Date: 10/06/2021 CLINICAL DATA:  Check enteric tube positioning. EXAM: PORTABLE ABDOMEN - 1 VIEW COMPARISON:  None. FINDINGS: Limited abdomen film, 3:21 a.m. on 10/06/2021 is compared to the contemporaneous chest film. This film only includes the left upper abdomen and left mid to lower lung field. NGT is in place with the proximal side-hole just below the hiatus and could be advanced further in 10 cm for better placement. Intrathoracic support devices and tubes are as described on the prior chest film report. No dilated bowel or gastric dilatation is seen in the visualized portion of the left upper abdomen. There is no supine evidence of free air. No interval change in the visualized lung fields. IMPRESSION: Enteric tube positioning as described above. Electronically Signed   By: Telford Nab M.D.   On: 10/06/2021 05:48     Assessment/Plan: S/P Procedure(s) (LRB): CORONARY ARTERY BYPASS GRAFTING (CABG) X 1, ON PUMP, USING LEFT INTERNAL MAMMARY ARTERY CONDUIT (N/A) AORTIC VALVE REPLACEMENT (AVR) WITH INSPIRIS RESILIA AORTIC VALVE SIZE 27MM (N/A) TRANSESOPHAGEAL ECHOCARDIOGRAM (TEE) (N/A) APPLICATION OF CELL SAVER CV-CO/CI 7.6/3.1. SR with HR in the 80-90's. On Epi 5 mcg/min, Milrinone 0.25 mcg/kg/min, and Nor epineprhine 8 mcg/min drips. Trying to wean Epi this am. Pulmonary-remains intubated. ABG results from earlier noted. Chest tubes with 420 cc since surgery. Chest tubes to remain for now. CXR this am shows no pneumothorax, interstitial edema . History of OSA-CPAP. Pulmonary/CCM following. Volume overload-will discuss management with surgeon DM-CBGs 118/97/102. Transition off Insulin drip, start scheduled Insulin. Pre op HGA1C 7.1 Expected post op blood loss anemia-H and H this am stable at 12 and 36.5 Gently supplement potassium 7. ID-started on Cefepime for ? PNA 8. ESRD-Had previous left nephrectomy for congential  non functioning kidney, renal transplant 2018. Creatinine this am slightly increased to 2.77. Creatinine upon admission 2.3 9. GI-KUB earlier this am showed no dilated bowel or gastric dilation, NGT could be advanced further 10. Fever to 100.8. Likely combinations-SIRS, possible PNA  Donielle Liston Alba PA-C 10/06/2021 7:31 AM    Chart reviewed, patient examined, agree with above. He has been hemodynamically stable. Wean off milrinone and vasopressors as tolerated to keep mean >65. Extubated this am and says he feels ok. Breathing  comfortably. Will need to work on [place]. CXR looked much better this am. DC chest tubes today. Creat up to 2.77 from 2.3. Hold off on diuresis until it stabilizes. I will call nephrology. Discussed status and plans with wife and daughter.

## 2021-10-06 NOTE — Discharge Instructions (Addendum)
We ask the SNF to please do the following: 1. Please obtain vital signs at least one time daily 2.Please weigh the patient daily. If he or she continues to gain weight or develops lower extremity edema, contact the office at [phone]. 3. Ambulate patient at least three times daily and please use sternal precautions.   Discharge Instructions:  1. You may shower, please wash incisions daily with soap and water and keep dry.  If you wish to cover wounds with dressing you may do so but please keep clean and change daily.  No tub baths or swimming until incisions have completely healed.  If your incisions become red or develop any drainage please call our office at [phone]  2. No Driving until cleared by Dr. Vivi Martens office and you are no longer using narcotic pain medications  3. Monitor your weight daily.. Please use the same scale and weigh at same time... If you gain 5-10 lbs in 48 hours with associated lower extremity swelling, please contact our office at [phone]  4. Fever of 101.5 for at least 24 hours with no source, please contact our office at [phone]  5. Activity- up as tolerated, please walk at least 3 times per day.  Avoid strenuous activity, no lifting, pushing, or pulling with your arms over 8-10 lbs for a minimum of 6 weeks  6. If any questions or concerns arise, please do not hesitate to contact our office at [phone]   Information on my medicine - Coumadin   (Warfarin)  This medication education was reviewed with me or my healthcare representative as part of my discharge preparation.  The pharmacist that spoke with me during my hospital stay was:  Einar Grad, [facility]  Why was Coumadin prescribed for you? Coumadin was prescribed for you because you have a blood clot or a medical condition that can cause an increased risk of forming blood clots. Blood clots can cause serious health problems by blocking the flow of blood to the heart, lung, or brain.  Coumadin can prevent harmful blood clots from forming. As a reminder your indication for Coumadin is:  Stroke Prevention because of Atrial Fibrillation  What test will check on my response to Coumadin? While on Coumadin (warfarin) you will need to have an INR test regularly to ensure that your dose is keeping you in the desired range. The INR (international normalized ratio) number is calculated from the result of the laboratory test called prothrombin time (PT).  If an INR APPOINTMENT HAS NOT ALREADY BEEN MADE FOR YOU please schedule an appointment to have this lab work done by your health care provider within 7 days. Your INR goal is usually a number between:  2 to 3 or your provider may give you a more narrow range like 2-2.5.  Ask your health care provider during an office visit what your goal INR is.  What  do you need to  know  About  COUMADIN? Take Coumadin (warfarin) exactly as prescribed by your healthcare provider about the same time each day.  DO NOT stop taking without talking to the doctor who prescribed the medication.  Stopping without other blood clot prevention medication to take the place of Coumadin may increase your risk of developing a new clot or stroke.  Get refills before you run out.  What do you do if you miss a dose? If you miss a dose, take it as soon as you remember on the same day then continue your regularly scheduled regimen  the next day.  Do not take two doses of Coumadin at the same time.  Important Safety Information A possible side effect of Coumadin (Warfarin) is an increased risk of bleeding. You should call your healthcare provider right away if you experience any of the following: Bleeding from an injury or your nose that does not stop. Unusual colored urine (red or dark brown) or unusual colored stools (red or black). Unusual bruising for unknown reasons. A serious fall or if you hit your head (even if there is no bleeding).  Some foods or medicines  interact with Coumadin (warfarin) and might alter your response to warfarin. To help avoid this: Eat a balanced diet, maintaining a consistent amount of Vitamin K. Notify your provider about major diet changes you plan to make. Avoid alcohol or limit your intake to 1 drink for women and 2 drinks for men per day. (1 drink is 5 oz. wine, 12 oz. beer, or 1.5 oz. liquor.)  Make sure that ANY health care provider who prescribes medication for you knows that you are taking Coumadin (warfarin).  Also make sure the healthcare provider who is monitoring your Coumadin knows when you have started a new medication including herbals and non-prescription products.  Coumadin (Warfarin)  Major Drug Interactions  Increased Warfarin Effect Decreased Warfarin Effect  Alcohol (large quantities) Antibiotics (esp. Septra/Bactrim, Flagyl, Cipro) Amiodarone (Cordarone) Aspirin (ASA) Cimetidine (Tagamet) Megestrol (Megace) NSAIDs (ibuprofen, naproxen, etc.) Piroxicam (Feldene) Propafenone (Rythmol SR) Propranolol (Inderal) Isoniazid (INH) Posaconazole (Noxafil) Barbiturates (Phenobarbital) Carbamazepine (Tegretol) Chlordiazepoxide (Librium) Cholestyramine (Questran) Griseofulvin Oral Contraceptives Rifampin Sucralfate (Carafate) Vitamin K   Coumadin (Warfarin) Major Herbal Interactions  Increased Warfarin Effect Decreased Warfarin Effect  Garlic Ginseng Ginkgo biloba Coenzyme Q10 Green tea St. Johns wort    Coumadin (Warfarin) FOOD Interactions  Eat a consistent number of servings per week of foods HIGH in Vitamin K (1 serving =  cup)  Collards (cooked, or boiled & drained) Kale (cooked, or boiled & drained) Mustard greens (cooked, or boiled & drained) Parsley *serving size only =  cup Spinach (cooked, or boiled & drained) Swiss chard (cooked, or boiled & drained) Turnip greens (cooked, or boiled & drained)  Eat a consistent number of servings per week of foods MEDIUM-HIGH in Vitamin  K (1 serving = 1 cup)  Asparagus (cooked, or boiled & drained) Broccoli (cooked, boiled & drained, or raw & chopped) Brussel sprouts (cooked, or boiled & drained) *serving size only =  cup Lettuce, raw (green leaf, endive, romaine) Spinach, raw Turnip greens, raw & chopped   These websites have more information on Coumadin (warfarin):  FailFactory.se; VeganReport.com.au;

## 2021-10-06 NOTE — Progress Notes (Signed)
° °   °  [address]       [place] [address]             [phone]    POD # 1 AVR CABG  No complaints  BP 134/60    Pulse 99    Temp 99 F (37.2 C)    Resp (!) 35    Ht 5\' 11"  (1.803 m)    Wt (!) 140.9 kg    SpO2 95%    BMI 43.32 kg/m  6L  95 % sat  Intake/Output Summary (Last 24 hours) at 10/06/2021 1801 Last data filed at 10/06/2021 1700 Gross per 24 hour  Intake 4697.27 ml  Output 1526 ml  Net 3171.27 ml   Milrinone 0.125 Epi 3 Norepi 13  Creatinine 2.99, K= 4.1 Hct 36  Renal following  Remo Lipps C. Roxan Hockey, MD [facility] [phone]

## 2021-10-06 NOTE — Consult Note (Addendum)
Nephrology Consult   Requesting provider: [facility] requesting consult: CT surgery Reason for consult: AKI on CKD 3b   Assessment/Recommendations: Robert [name] is a/an 60 y.o. male with a past medical history CAD, obesity, DM2, ESRD s/p renal tx in 2018 now w/ CKD3b (BL Crt 1.9-2.3), pHTN who presents for AVR and LAD bypass on 12/29 c/b AKI  Non-Oliguric AKI on CKD 3B: Likely secondary to hemodynamic insult secondary to surgery.  Likely some degree of ATN.  Cholesterol emboli is possible but given minimal change in creatinine this is less likely a major concern.  Urine output remains fairly good and hemodynamics are reassuring. -Continue supportive care -Continue to monitor daily Cr, Dose meds for GFR -Monitor Daily I/Os, Daily weight  -Maintain MAP>65 for optimal renal perfusion.  -Avoid nephrotoxic medications including NSAIDs and Vanc/Zosyn combo -We will obtain urinalysis as well as C3  History of kidney transplant/CKD 3B: 2018 at [facility].  Follows with Dr. Lissa Merlin.  No history of rejection.  Recent creatinine has been around 1.9-2.3 with fluctuations.  Continue home CellCept and prednisone.  Received belatacept on 10/04/2021.  Volume Status: Appears volume overloaded.  Can use diuretics as needed based on invasive hemodynamics.  Hopefully will improve as renal function recovers  Hypertension/hypotension: Holding home blood pressure medications.  MAP fairly good.  Wean pressors as tolerated.  Management per primary team  Anemia: Likely multifactorial with some blood loss related to surgery.  Hemoglobin 9.9.  Management per primary  DM2: Management per primary team.  Currently on insulin infusion  S/p AVR and LAD bypass: seems to be recovering well. Mgmt per surgery  Hypokalemia: Low potassium this morning.  Replete with oral potassium.   Recommendations conveyed to primary service.    [facility] 10/06/2021 12:45  PM   _____________________________________________________________________________________ CC: AKI on CKD3b  History of Present Illness: Robert [name] is a/an 60 y.o. male with a past medical history of CAD, obesity, DM2, ESRD s/p renal tx in 2018 now w/ CKD3b (BL Crt 1.9-2.3), pHTN who presents for AVR and LAD bypass on 12/29.  Patient was admitted on 12/29 for aortic valve repair due to his history of severe aortic stenosis.  Also underwent LAD bypass.  Surgery seem to go fairly well with 147 minutes on pump, 111 min clamp. ~500cc EBL.  Patient intubated postoperatively but was extubated this morning.  Has continued to make urine after the surgery.  He currently is on epinephrine, milrinone, norepinephrine with favorable hemodynamics.  He underwent kidney transplant in 2018.  He follows with Dr. Lissa Merlin at [name].  Does not have local nephrologist.  Current immunosuppression includes prednisone 5 mg daily, CellCept 1000 mg twice daily, belatacept monthly.  Last bela infusion was 10/04/21.  He states that his kidney function has fluctuated since his kidney transplant and somewhat been worse over the past 6 months.  No history of rejection.  Has not had a kidney biopsy recently but did have allosure checked which was reassuring.   Currently feels very tired.  Denies definitive chest pain.  Some ongoing shortness of breath.  Does have peripheral edema and states this is not normal for him.  Denies significant nausea or vomiting at this time.   Medications:  Current Facility-Administered Medications  Medication Dose Route Frequency Provider Last Rate Last Admin   0.45 % sodium chloride infusion   Intravenous Continuous PRN Gold, Wayne E, PA-C 20 mL/hr at 10/06/21 1200 Infusion Verify at 10/06/21 1200   0.9 %  sodium chloride infusion  250 mL Intravenous Continuous Gold, Wayne E, PA-C       0.9 %  sodium chloride infusion   Intravenous Continuous Gold, Wayne E, PA-C       acetaminophen (TYLENOL)  tablet 1,000 mg  1,000 mg Oral Q6H Gold, Wayne E, PA-C   1,000 mg at 10/06/21 1118   Or   acetaminophen (TYLENOL) 160 MG/5ML solution 1,000 mg  1,000 mg Per Tube Q6H Gold, Wayne E, PA-C   1,000 mg at 10/06/21 0510   aspirin EC tablet 325 mg  325 mg Oral Daily John Giovanni, PA-C   325 mg at 10/06/21 1010   Or   aspirin chewable tablet 324 mg  324 mg Per Tube Daily Gold, Wayne E, PA-C       bisacodyl (DULCOLAX) EC tablet 10 mg  10 mg Oral Daily Gold, Wayne E, PA-C   10 mg at 10/06/21 1010   Or   bisacodyl (DULCOLAX) suppository 10 mg  10 mg Rectal Daily Gold, Wayne E, PA-C       Chlorhexidine Gluconate Cloth 2 % PADS 6 each  6 each Topical Daily [name], MD   6 each at 10/05/21 2202   dextrose 50 % solution 0-50 mL  0-50 mL Intravenous PRN Gold, Wayne E, PA-C       docusate (COLACE) 50 MG/5ML liquid 100 mg  100 mg Per Tube BID Cristal Generous, NP   100 mg at 10/05/21 2118   DULoxetine (CYMBALTA) DR capsule 60 mg  60 mg Oral Daily Gold, Wayne E, PA-C   60 mg at 10/06/21 1010   enoxaparin (LOVENOX) injection 40 mg  40 mg Subcutaneous QHS Zimmerman, Donielle M, PA-C       EPINEPHrine (ADRENALIN) 5 mg in NS 250 mL (0.02 mg/mL) premix infusion  0.5-10 mcg/min Intravenous Titrated [name], MD 9 mL/hr at 10/06/21 1200 3 mcg/min at 10/06/21 1200   insulin aspart (novoLOG) injection 0-24 Units  0-24 Units Subcutaneous Q4H Lars Pinks M, PA-C       insulin detemir (LEVEMIR) injection 30 Units  30 Units Subcutaneous BID [name], MD   30 Units at 10/06/21 1215   lactated ringers infusion   Intravenous Continuous Gold, Wayne E, PA-C       lactated ringers infusion   Intravenous Continuous Jadene Pierini E, PA-C 20 mL/hr at 10/06/21 1200 Infusion Verify at 10/06/21 1200   [START ON 10/07/2021] levothyroxine (SYNTHROID) tablet 100 mcg  100 mcg Per Tube Q0600 Bowser, Laurel Dimmer, NP       magnesium sulfate IVPB 4 g 100 mL  4 g Intravenous Once Gold, Wayne E, PA-C       MEDLINE mouth  rinse  15 mL Mouth Rinse BID [name], MD   15 mL at 10/06/21 1011   metoprolol tartrate (LOPRESSOR) injection 2.5-5 mg  2.5-5 mg Intravenous Q2H PRN Gold, Wayne E, PA-C       milrinone (PRIMACOR) 20 MG/100 ML (0.2 mg/mL) infusion  0.25 mcg/kg/min Intravenous Continuous Einar Grad, RPH 9.98 mL/hr at 10/06/21 1200 0.25 mcg/kg/min at 10/06/21 1200   morphine 2 MG/ML injection 1-4 mg  1-4 mg Intravenous Q1H PRN Jadene Pierini E, PA-C   2 mg at 10/06/21 3382   mycophenolate (CELLCEPT) capsule 1,000 mg  1,000 mg Oral Q12H [name], MD   1,000 mg at 10/06/21 1018   nitroGLYCERIN 50 mg in dextrose 5 % 250 mL (0.2 mg/mL) infusion  0-100 mcg/min Intravenous Titrated John Giovanni, PA-C   Held at 10/05/21 1510   norepinephrine (LEVOPHED) 4mg  in 263mL (0.016 mg/mL) premix infusion  0-40 mcg/min Intravenous Titrated Einar Grad, RPH 52.5 mL/hr at 10/06/21 1200 14 mcg/min at 10/06/21 1200   ondansetron (ZOFRAN) injection 4 mg  4 mg Intravenous Q6H PRN Jadene Pierini E, PA-C       oxyCODONE (Oxy IR/ROXICODONE) immediate release tablet 5-10 mg  5-10 mg Oral Q3H PRN Jadene Pierini E, PA-C   10 mg at 10/06/21 1009   pantoprazole (PROTONIX) injection 40 mg  40 mg Intravenous QHS Cristal Generous, NP   40 mg at 10/05/21 2115   phenylephrine (NEO-SYNEPHRINE) 20mg /NS 242mL premix infusion  0-400 mcg/min Intravenous Titrated John Giovanni, PA-C   Held at 10/05/21 1511   polyethylene glycol (MIRALAX / GLYCOLAX) packet 17 g  17 g Per Tube Daily Bowser, Laurel Dimmer, NP   17 g at 10/05/21 1708   predniSONE (DELTASONE) tablet 5 mg  5 mg Oral Q breakfast Gold, Wayne E, PA-C   5 mg at 10/06/21 1010   rosuvastatin (CRESTOR) tablet 20 mg  20 mg Oral Daily Gold, Wayne E, PA-C   20 mg at 10/06/21 1010   sodium chloride flush (NS) 0.9 % injection 10-40 mL  10-40 mL Intracatheter Q12H [name], MD   10 mL at 10/06/21 0931   sodium chloride flush (NS) 0.9 % injection 10-40 mL  10-40 mL Intracatheter PRN [name], MD       sodium chloride flush (NS) 0.9 % injection 3 mL  3 mL Intravenous Q12H Gold, Wayne E, PA-C   3 mL at 10/06/21 0931   sodium chloride flush (NS) 0.9 % injection 3 mL  3 mL Intravenous PRN Gold, Wayne E, PA-C       traMADol (ULTRAM) tablet 50 mg  50 mg Oral Q6H PRN [name], MD         ALLERGIES Patient has no allergy information on record.  MEDICAL HISTORY Past Medical History:  Diagnosis Date   Anemia    low hgb at present   Arthritis    FEET   Blood transfusion without reported diagnosis 1974   kidney removed - L   Cancer ([place])    skin ca right shoulder, plastic dsyplasia, pre-Ca polpys removed on Colonoscopy- 07/2014   Colon polyps 07/23/2014   Tubular adenomas x 5   Constipation    Coronary artery disease    Diabetes mellitus without complication ([place])    Type 2   Difficult intubation    unsure of actual problem but it was during the January 28, 2015 procedure.   Dysrhythmia    Eczema    ESRD (end stage renal disease) ([place])    hemodialysis 06/2014-02/2017, s/p living unrelated kidney transplant 02/26/17   Headache(784.0)    slight dull h/a due kidney failure   Heart murmur    aortic stenosis (moderate-severe 04/2021)   Hyperlipidemia    Hypertension    SVT h/o , followed by Dr. Claiborne Billings   Hypothyroidism    Macular degeneration    Neuropathy    right hand and both feet   PAT (paroxysmal atrial tachycardia) (H[facility])    PSVT (paroxysmal supraventricular tachycardia) (HCC)    SBO (small bowel obstruction) ([place]) 05/2015   Shortness of breath    with exertion   Sleep apnea 06/18/2011   split-night sleep study- [facility]., BiPAP- 13-16  Thyroid disease    Umbilical hernia    s/p repair 04/28/15     SOCIAL HISTORY Social History   Socioeconomic History   Marital status: Married    Spouse name: Not on file   Number of children: 3   Years of education: Not on file   Highest education level: Not on file  Occupational History    Occupation: Training and development officer: [employer]  Tobacco Use   Smoking status: Former    Types: Cigars    Quit date: 12/06/2012    Years since quitting: 8.8   Smokeless tobacco: Never  Vaping Use   Vaping Use: Never used  Substance and Sexual Activity   Alcohol use: Yes    Comment: once/month   Drug use: No   Sexual activity: Not on file  Other Topics Concern   Not on file  Social History Narrative   Not on file   Social Determinants of Health   Financial Resource Strain: Not on file  Food Insecurity: Not on file  Transportation Needs: Not on file  Physical Activity: Not on file  Stress: Not on file  Social Connections: Not on file  Intimate Partner Violence: Not on file     FAMILY HISTORY Family History  Problem Relation Age of Onset   Colon polyps Father    Colon cancer Neg Hx    Stomach cancer Neg Hx       Review of Systems: 12 systems reviewed Otherwise as per HPI, all other systems reviewed and negative  Physical Exam: Vitals:   10/06/21 1145 10/06/21 1200  BP: (!) 160/51 (!) 129/52  Pulse: 95 96  Resp: (!) 28 19  Temp: 99.1 F (37.3 C) 99.1 F (37.3 C)  SpO2: 92% 92%   Total I/O In: 605.1 [I.V.:605.1] Out: 520 [Urine:340; Emesis/NG output:10; Chest Tube:170]  Intake/Output Summary (Last 24 hours) at 10/06/2021 1245 Last data filed at 10/06/2021 1200 Gross per 24 hour  Intake 6504 ml  Output 2025 ml  Net 4479 ml   General: Obese, lying in bed, no distress HEENT: anicteric sclera, oropharynx clear without lesions CV: chest wound covered, Normal rate, difficult to auscultate any murmurs, 1+ pitting edema in all 4 extremities Lungs: clear to auscultation bilaterally, mild increased work of breathing Abd: soft, non-tender, non-distended Skin: no visible lesions or rashes Psych: alert, engaged, appropriate mood and affect Musculoskeletal: no obvious deformities Neuro: normal speech, no gross focal deficits   Test Results Reviewed Lab Results   Component Value Date   NA 141 10/06/2021   K 3.1 (L) 10/06/2021   CL 107 10/06/2021   CO2 22 10/06/2021   BUN 46 (H) 10/06/2021   CREATININE 2.77 (H) 10/06/2021   GFR 36.74 (L) 07/04/2021   GLU 203 07/07/2019   CALCIUM 7.7 (L) 10/06/2021   ALBUMIN 3.6 10/03/2021   PHOS 4.4 05/13/2015     I have reviewed all relevant outside healthcare records related to the patient's current hospitalization

## 2021-10-06 NOTE — Discharge Summary (Signed)
Physician Discharge Summary       [id number]       [place] [address]             [phone]    Patient ID: Robert [name] MRN: [id number] DOB/AGE: December 06, 1960 60 y.o.  Admit date: 10/05/2021 Discharge date: 10/18/2021  Admission Diagnoses: Single vessel coronary artery disease 2. Severe aortic stenosis  Discharge Diagnoses:  1.  S/P CABG x 1,  AVR (aortic valve replacement) 2. Expected post op blood loss anemia 3. History of the following: Anemia       low hgb at present   Arthritis      FEET   Blood transfusion without reported diagnosis 1974    kidney removed - L   Cancer ([place])      skin ca right shoulder, plastic dsyplasia, pre-Ca polpys removed on Colonoscopy- 07/2014   Colon polyps 07/23/2014    Tubular adenomas x 5   Constipation     Diabetes mellitus without complication ([place])      Type 2   Difficult intubation      unsure of actual problem but it was during the January 28, 2015 procedure.   Dysrhythmia     Eczema     ESRD (end stage renal disease) (HCC)      hEMO mwf   Headache(784.0)      slight dull h/a due kidney failure   Hyperlipidemia     Hypertension      SVT h/o , followed by Dr. Claiborne Billings   Hypothyroidism     Macular degeneration     Neuropathy      right hand and both feet   PAT (paroxysmal atrial tachycardia) (HCC)     PSVT (paroxysmal supraventricular tachycardia) (HCC)     SBO (small bowel obstruction) (HCC)     Shortness of breath      with exertion   Sleep apnea 06/18/11    split-night sleep study- [facility]., BiPAP- 13-16   Thyroid disease     Umbilical hernia      will repair with thi surgery     Consults: nephrology and neurology  Procedure (s):  Median Sternotomy Extracorporeal circulation 3.   Coronary artery bypass grafting x 1   Left internal mammary artery graft to the LAD   4.   Aortic valve replacement using a 27 mm Edwards INSPIRIS RESILIA pericardial valve by Dr. Cyndia Bent on  10/05/2021. HPI:   The patient is a 66 gentleman with history of hypertension, hyperlipidemia, type 2 diabetes, stage IIIb chronic kidney disease status post nephrectomy at age 66 for congenital nonfunctioning left kidney and subsequent renal transplant in 2018, OSA on CPAP, morbid obesity, PSVT status post ablation in 2018, severe aortic stenosis, and coronary artery disease who was referred for consideration of surgical treatment.  He is here today with his wife.  He reports a several month history of progressive exertional shortness of breath.  He has also been having intermittent upper chest discomfort radiating into his neck and shoulders associated with diaphoresis and nausea.  He denies any dizziness although he did have an episode of vertigo and fell off a ladder.  He did not have any loss of consciousness or head trauma but did injure his right leg and left shoulder.  He had a skin abrasion of his right lower extremity which developed some cellulitis and he was treated with antibiotics.  He denies orthopnea and PND.  He has had some  lower extremity edema.   His last echocardiogram on 05/02/2021 showed a trileaflet aortic valve with moderate calcification and thickening with restricted leaflet mobility.  The mean gradient was 44 mmHg with trivial insufficiency.  There was mild enlargement of the ascending aorta at 4.2 cm.  There was moderate to severe mitral annular calcification with mild mitral regurgitation.  Pulmonary pressure was moderately elevated with normal right ventricular function.  Left ventricular ejection fraction was 60 to 65% with moderate LVH and grade 2 diastolic dysfunction.  His previous echocardiogram February 2022 showed a mean gradient of 35 mm.  Cardiac catheterization on 08/22/2021 showed an 80% ostial proximal LAD stenosis followed by a 90% proximal to mid LAD stenosis.  Left circumflex had a large second marginal that 50% stenosis.  The RCA had about 80% mid PDA stenosis which  was a moderate size vessel and an 80% lesion in the AV groove portion before a few small posterolateral branches.  Right heart catheterization showed a PA pressure of 41/11 with a mean right atrial pressure of 4.  Mean wedge pressure was 15 2015.  Cardiac index 2.9.  The mean aortic valve gradient was 40 mmHg peak to peak gradient of 42 mmHg.  Aortic valve area was 0.95 cm.  LVEDP was 22.   The patient is married and lives at home with his wife.  He works from home.  He has never smoked cigarettes.  Dr. Cyndia Bent evaluated the patient and all relevant studies have recommended proceeding with CABG x1 and aortic valve replacement.  Hospital course:  The patient was admitted electively and on 10/05/2021 taken the operating room and underwent coronary artery bypass grafting x1 with a left internal mammary artery graft to the LAD as well as aortic valve replacement using a 27 mm Edwards Inspira's Resilia pericardial tissue valve.  He tolerated the procedure well was taken to the surgical intensive care unit in critical but stable condition.  Postoperative hospital course:  The patient initially was hemodynamically stable but did require milrinone, epinephrine and Levophed for hemodynamic support.  His cardiac indices have shown steady improvement and they have been weaned off over time.Marland Kitchen  He was extubated on postop day 1.  Chest tubes were removed on postop period.  Postop day 1 creatinine was increased to 2.77 and due to his significant renal history including transplant nephrology consultation was obtained to assist.  He was kept on his CellCept and prednisone.  Volume overload was treated with diuretics. The CCM service was consulted to assist with ICU management.  He was noted to have an expected acute blood loss anemia which stabilized.  He has developed postoperative atrial fibrillation and is being treated with beta-blocker and amiodarone.  He developed some right lower extremity weakness and on postop  day 2 neurology consultation was obtained.  Exam findings were concerning for new CVA and patient will be scheduled for an MRI and MRA of the brain after pacer leads are removed. No hemorrhage or hematoma was identified on the CT obtained on 10/08/21.  He had recurrence of the atrial fibrillation and was given an additional IV amiodarone bolus on 10/10/21. Epicardial pacer wires were removed.  The [name] weakness and right hip pain persisted. MRI of the brain and lumbar spine were obtained per neurology recommendations. There were no major obstructive vascular lesions identified but he did have multiple R>L embolic lesions in the brain.  Neurology was re-consulted for assessment and recommended continued anticoagulation with Coumadin, PT/OT, and outpatient neurology follow up.  He did gain some strength in his right upper and lower extremities over time but needs continued rehab modalities. The PT and OT teams recommended continued rehab in a SNF following discharge.  The transition of care team assisted with securing a bed in a facility.  By post-op day 8, the INR was I.8 on a daily Coumadin dose of $Remov'5mg'gwGTTu$ . INR went up to 2.1 on 01/07 and to 2.6 the following day. Coumadin was decreased to 2.5 mg daily.Patient was started on Hydralazine for better BP control.  This was increased over time.  Blood pressure control has improved.  His blood sugars have been stable on current regimen.  Hopefully, when he is able to return home he can resume his insulin pump.  Incisions are noted to be healing well without evidence of infection.  He is tolerating diet.  Oxygen has been weaned and maintains good saturations on room air and continues to wear CPAP at night.  His creatinine has remained stable and is, in fact, below preoperative level.  Most recent value was 2.14 with BUN of 51.  He is making excellent urine.  At the time of discharge the patient is felt to be quite stable to be his transferred to the skilled facility for ongoing  rehabilitation.    Latest Vital Signs: Blood pressure (!) 160/68, pulse 75, temperature 98.4 F (36.9 C), temperature source Oral, resp. rate 19, height $RemoveBe'5\' 11"'OSpjGrJvc$  (1.803 m), weight 130.5 kg, SpO2 94 %.  Physical Exam: General appearance: alert, cooperative, and no distress Heart: regular rate and rhythm Lungs: mildly dim in left >right base Abdomen: benign Extremities: + pedal edema Wound: incis healing well Discharge Condition: Stable and discharged to SNF.  Recent laboratory studies:  Lab Results  Component Value Date   WBC 9.1 10/18/2021   HGB 12.4 (L) 10/18/2021   HCT 40.1 10/18/2021   MCV 90.7 10/18/2021   PLT 408 (H) 10/18/2021   Lab Results  Component Value Date   NA 134 (L) 10/15/2021   K 4.5 10/15/2021   CL 107 10/15/2021   CO2 21 (L) 10/15/2021   CREATININE 2.14 (H) 10/15/2021   GLUCOSE 167 (H) 10/15/2021      Diagnostic Studies: CT ABDOMEN PELVIS WO CONTRAST  Result Date: 10/09/2021 CLINICAL DATA:  Recent kidney transplant surgery. Evaluate for possible retroperitoneal hemorrhage. EXAM: CT ABDOMEN AND PELVIS WITHOUT CONTRAST TECHNIQUE: Multidetector CT imaging of the abdomen and pelvis was performed following the standard protocol without IV contrast. COMPARISON:  04/23/2019 FINDINGS: Lower chest: Small bilateral pleural effusions. Dependent lung base opacities noted consistent with atelectasis. Hepatobiliary: No focal liver abnormality is seen. No gallstones, gallbladder wall thickening, or biliary dilatation. Pancreas: Unremarkable. No pancreatic ductal dilatation or surrounding inflammatory changes. Spleen: Normal in size without focal abnormality. Adrenals/Urinary Tract: No adrenal mass. Marked right renal atrophy. No normal left kidney visualized. Small round focus of low attenuation soft tissue in the left renal fossa is stable. Ureters normal in course and in caliber. Bladder decompressed by a Foley catheter. Right iliac fossa renal transplant kidney is normal in  size. No mass. No adjacent fluid collection. No hydronephrosis. Stomach/Bowel: Normal stomach. Small bowel and colon are normal in caliber. No wall thickening. No inflammation. No evidence of appendicitis. Vascular/Lymphatic: Aortic atherosclerosis. No aneurysm. No enlarged lymph nodes. Reproductive: Unremarkable. Other: No retroperitoneal or intraperitoneal hemorrhage. No ascites. Fluid attenuation tracks along the right flank within the deep subcutaneous soft tissues. No abdominal wall hernia. Musculoskeletal: No fracture or acute finding.  No bone  lesion IMPRESSION: 1. No acute findings within the abdomen or pelvis. No evidence of a retroperitoneal hemorrhage or other complication from renal transplant surgery. 2. Small pleural effusions with associated dependent lung base atelectasis. 3. Aortic atherosclerosis. Electronically Signed   By: Lajean Manes M.D.   On: 10/09/2021 09:29   DG Chest 2 View  Result Date: 10/03/2021 CLINICAL DATA:  Chronic shortness of breath. Preoperative study for CABG/AVR. EXAM: CHEST - 2 VIEW COMPARISON:  Chest x-ray dated July 23, 2017. FINDINGS: The heart size and mediastinal contours are within normal limits. Pulmonary vascular congestion with mild upper lobe predominant interstitial thickening. Chronic blunting/scarring of the left costophrenic angle. No focal consolidation, pleural effusion, or pneumothorax. No acute osseous abnormality. Unchanged right subclavian stent. IMPRESSION: 1. Mild pulmonary vascular congestion and interstitial edema. Electronically Signed   By: Titus Dubin M.D.   On: 10/03/2021 12:00   CT HEAD WO CONTRAST (5MM)  Result Date: 10/07/2021 CLINICAL DATA:  Neuro deficit, acute, stroke suspected. Right leg weakness. EXAM: CT HEAD WITHOUT CONTRAST TECHNIQUE: Contiguous axial images were obtained from the base of the skull through the vertex without intravenous contrast. COMPARISON:  None. FINDINGS: Brain: No acute infarct, hemorrhage, or mass  lesion is present. Mild scattered subcortical white matter hypoattenuation is present bilaterally. Basal ganglia are intact. Insular ribbon is normal. No acute or focal cortical abnormalities are present. Insert normal brainstem Vascular: Dense calcifications are present within the cavernous internal carotid arteries and at the left greater than right vertebral artery at the dural margin. No hyperdense vessels are associated. Skull: Calvarium is intact. No focal lytic or blastic lesions are present. No significant extracranial soft tissue lesion is present. Sinuses/Orbits: Fluid level is present in the left frontal sinus. Mild mucosal thickening is present within anterior ethmoid air cells in the inferior right frontal sinus. No additional fluid levels are present. Minimal mucosal thickening is present in the right sphenoid sinus. The paranasal sinuses and mastoid air cells are otherwise clear. The globes and orbits are within normal limits. IMPRESSION: 1. No acute intracranial abnormality. 2. Mild scattered subcortical white matter hypoattenuation bilaterally likely reflects the sequela of chronic microvascular ischemia. 3. Mild anterior sinus disease. These results were called by telephone at the time of interpretation on 10/07/2021 at 12:11 pm to provider [facility] , who verbally acknowledged these results. Electronically Signed   By: San Morelle M.D.   On: 10/07/2021 12:19   MR ANGIO HEAD WO CONTRAST  Result Date: 10/11/2021 CLINICAL DATA:  60 year old male with hypertension, diabetes, chronic kidney disease. Neurologic deficit with right leg weakness. EXAM: MRI HEAD WITHOUT CONTRAST MRA HEAD WITHOUT CONTRAST MRA NECK WITHOUT CONTRAST TECHNIQUE: Multiplanar, multiecho pulse sequences of the brain and surrounding structures were obtained without intravenous contrast. Angiographic images of the Circle of Willis were obtained using MRA technique without intravenous contrast. Angiographic images  of the neck were obtained using MRA technique without intravenous contrast. Carotid stenosis measurements (when applicable) are obtained utilizing NASCET criteria, using the distal internal carotid diameter as the denominator. COMPARISON:  Head CT 10/07/2021 FINDINGS: MRI HEAD FINDINGS Brain: Patchy restricted diffusion. Most intensely restricted areas are in the left centrum semiovale posterior left frontal lobe (series 5, image 99). But there are also scattered small cortical infarcts in the left superior perirolandic area (series 5, image 106), left lateral cortex at the junction of the temporal and occipital lobes on image 84), but also involving some right parietal and occipital lobe cortex (series 7, image 49, series 5,  image 88) and the left lateral cerebellum on series 5, image 73. No brainstem or deep gray matter nuclei involvement identified. T2 and FLAIR hyperintensity compatible with cytotoxic edema in the acutely affected areas. No hemorrhage or mass effect. No definite chronic cerebral blood products. Outside of the acute diffusion abnormality there is mild for age scattered bilateral white matter T2 and FLAIR hyperintensity, with some chronic involvement of the left caudate. No midline shift, mass effect, evidence of mass lesion, ventriculomegaly, extra-axial collection or acute intracranial hemorrhage. Cervicomedullary junction and pituitary are within normal limits. Vascular: Major intracranial vascular flow voids appear preserved with dominant left vertebral artery suspected. See MRA findings below. Tortuous cavernous ICAs. Skull and upper cervical spine: Negative visible cervical spine. Visualized bone marrow signal is within normal limits. Sinuses/Orbits: Negative orbits. Scattered mild paranasal sinus mucosal thickening. Other: Mastoids are clear. Visible internal auditory structures appear normal. Negative visible scalp and face. MRA NECK FINDINGS Two and 3D time-of-flight imaging. Three vessel  arch configuration. Antegrade flow in the bilateral cervical carotid and vertebral arteries to the skull base. Common carotids, carotid bifurcations, and cervical ICAs appear within normal limits. No evidence of hemodynamically significant cervical carotid stenosis. The left vertebral artery appears dominant throughout the neck. And the right vertebral artery appears non dominant throughout. No evidence of hemodynamically significant stenosis. There is a partially retropharyngeal course of the right carotid. MRA HEAD FINDINGS Antegrade flow in the posterior circulation with dominant left vertebral artery supplying the basilar. On the neck MRA images the non dominant right vertebral artery may functionally terminates in muscular branches outside of the skull. However, axial T2 brain appearance suggests there is an occluded right vertebral artery V4 segment. Normal left PICA origin. No basilar stenosis. Patent SCA and right PCA origins. Fetal type left PCA origin. Bilateral PCA branches are within normal limits. Next trache that Antegrade flow in both ICA siphons. Tortuous cavernous ICAs. No siphon stenosis identified. Ophthalmic and left posterior communicating artery origins appear normal. Patent carotid termini, MCA and ACA origins. Diminutive or absent anterior communicating artery. There is a moderate ACA A2 segment stenosis on the right (series 1017, image 12). But otherwise the visible ACA branches are within normal limits. MCA M1 segments and MCA bi/trifurcations appear patent without stenosis. No MCA branch occlusion or stenosis identified. IMPRESSION: 1. Positive for acute to subacute scattered small infarcts in multiple vascular territories (left cerebellum, left MCA and bilateral PCA) raising the possibility of a recent embolic event. No associated hemorrhage or mass effect. The most acute appearing ischemia is in the left centrum semiovale. 2. 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 But no other occlusion or large vessel stenosis is identified on noncontrast MRA. There is a moderate stenosis of the right ACA A2. The left vertebral artery is dominant and there is a fetal type left PCA origin. 3. Otherwise mild to moderate for age chronic small vessel disease in the brain, most pronounced in the left caudate. Electronically Signed   By: Genevie Ann M.D.   On: 10/11/2021 06:34   MR ANGIO NECK WO CONTRAST  Result Date: 10/11/2021 CLINICAL DATA:  60 year old male with hypertension, diabetes, chronic kidney disease. Neurologic deficit with right leg weakness. EXAM: MRI HEAD WITHOUT CONTRAST MRA HEAD WITHOUT CONTRAST MRA NECK WITHOUT CONTRAST TECHNIQUE: Multiplanar, multiecho pulse sequences of the brain and surrounding structures were obtained without intravenous contrast. Angiographic images of the Circle of Willis were obtained using MRA technique without intravenous  contrast. Angiographic images of the neck were obtained using MRA technique without intravenous contrast. Carotid stenosis measurements (when applicable) are obtained utilizing NASCET criteria, using the distal internal carotid diameter as the denominator. COMPARISON:  Head CT 10/07/2021 FINDINGS: MRI HEAD FINDINGS Brain: Patchy restricted diffusion. Most intensely restricted areas are in the left centrum semiovale posterior left frontal lobe (series 5, image 99). But there are also scattered small cortical infarcts in the left superior perirolandic area (series 5, image 106), left lateral cortex at the junction of the temporal and occipital lobes on image 84), but also involving some right parietal and occipital lobe cortex (series 7, image 49, series 5, image 88) and the left lateral cerebellum on series 5, image 73. No brainstem or deep gray matter nuclei involvement identified. T2 and FLAIR hyperintensity compatible with cytotoxic edema in the acutely affected areas. No hemorrhage or mass effect. No  definite chronic cerebral blood products. Outside of the acute diffusion abnormality there is mild for age scattered bilateral white matter T2 and FLAIR hyperintensity, with some chronic involvement of the left caudate. No midline shift, mass effect, evidence of mass lesion, ventriculomegaly, extra-axial collection or acute intracranial hemorrhage. Cervicomedullary junction and pituitary are within normal limits. Vascular: Major intracranial vascular flow voids appear preserved with dominant left vertebral artery suspected. See MRA findings below. Tortuous cavernous ICAs. Skull and upper cervical spine: Negative visible cervical spine. Visualized bone marrow signal is within normal limits. Sinuses/Orbits: Negative orbits. Scattered mild paranasal sinus mucosal thickening. Other: Mastoids are clear. Visible internal auditory structures appear normal. Negative visible scalp and face. MRA NECK FINDINGS Two and 3D time-of-flight imaging. Three vessel arch configuration. Antegrade flow in the bilateral cervical carotid and vertebral arteries to the skull base. Common carotids, carotid bifurcations, and cervical ICAs appear within normal limits. No evidence of hemodynamically significant cervical carotid stenosis. The left vertebral artery appears dominant throughout the neck. And the right vertebral artery appears non dominant throughout. No evidence of hemodynamically significant stenosis. There is a partially retropharyngeal course of the right carotid. MRA HEAD FINDINGS Antegrade flow in the posterior circulation with dominant left vertebral artery supplying the basilar. On the neck MRA images the non dominant right vertebral artery may functionally terminates in muscular branches outside of the skull. However, axial T2 brain appearance suggests there is an occluded right vertebral artery V4 segment. Normal left PICA origin. No basilar stenosis. Patent SCA and right PCA origins. Fetal type left PCA origin. Bilateral  PCA branches are within normal limits. Next trache that Antegrade flow in both ICA siphons. Tortuous cavernous ICAs. No siphon stenosis identified. Ophthalmic and left posterior communicating artery origins appear normal. Patent carotid termini, MCA and ACA origins. Diminutive or absent anterior communicating artery. There is a moderate ACA A2 segment stenosis on the right (series 1017, image 12). But otherwise the visible ACA branches are within normal limits. MCA M1 segments and MCA bi/trifurcations appear patent without stenosis. No MCA branch occlusion or stenosis identified. IMPRESSION: 1. Positive for acute to subacute scattered small infarcts in multiple vascular territories (left cerebellum, left MCA and bilateral PCA) raising the possibility of a recent embolic event. No associated hemorrhage or mass effect. The most acute appearing ischemia is in the left centrum semiovale. 2. 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 But no other occlusion or large vessel stenosis is identified on noncontrast MRA. There is a moderate stenosis of the right ACA A2. The left vertebral artery is dominant and  there is a fetal type left PCA origin. 3. Otherwise mild to moderate for age chronic small vessel disease in the brain, most pronounced in the left caudate. Electronically Signed   By: Genevie Ann M.D.   On: 10/11/2021 06:34   MR BRAIN WO CONTRAST  Result Date: 10/11/2021 CLINICAL DATA:  60 year old male with hypertension, diabetes, chronic kidney disease. Neurologic deficit with right leg weakness. EXAM: MRI HEAD WITHOUT CONTRAST MRA HEAD WITHOUT CONTRAST MRA NECK WITHOUT CONTRAST TECHNIQUE: Multiplanar, multiecho pulse sequences of the brain and surrounding structures were obtained without intravenous contrast. Angiographic images of the Circle of Willis were obtained using MRA technique without intravenous contrast. Angiographic images of the neck were obtained using MRA  technique without intravenous contrast. Carotid stenosis measurements (when applicable) are obtained utilizing NASCET criteria, using the distal internal carotid diameter as the denominator. COMPARISON:  Head CT 10/07/2021 FINDINGS: MRI HEAD FINDINGS Brain: Patchy restricted diffusion. Most intensely restricted areas are in the left centrum semiovale posterior left frontal lobe (series 5, image 99). But there are also scattered small cortical infarcts in the left superior perirolandic area (series 5, image 106), left lateral cortex at the junction of the temporal and occipital lobes on image 84), but also involving some right parietal and occipital lobe cortex (series 7, image 49, series 5, image 88) and the left lateral cerebellum on series 5, image 73. No brainstem or deep gray matter nuclei involvement identified. T2 and FLAIR hyperintensity compatible with cytotoxic edema in the acutely affected areas. No hemorrhage or mass effect. No definite chronic cerebral blood products. Outside of the acute diffusion abnormality there is mild for age scattered bilateral white matter T2 and FLAIR hyperintensity, with some chronic involvement of the left caudate. No midline shift, mass effect, evidence of mass lesion, ventriculomegaly, extra-axial collection or acute intracranial hemorrhage. Cervicomedullary junction and pituitary are within normal limits. Vascular: Major intracranial vascular flow voids appear preserved with dominant left vertebral artery suspected. See MRA findings below. Tortuous cavernous ICAs. Skull and upper cervical spine: Negative visible cervical spine. Visualized bone marrow signal is within normal limits. Sinuses/Orbits: Negative orbits. Scattered mild paranasal sinus mucosal thickening. Other: Mastoids are clear. Visible internal auditory structures appear normal. Negative visible scalp and face. MRA NECK FINDINGS Two and 3D time-of-flight imaging. Three vessel arch configuration. Antegrade flow  in the bilateral cervical carotid and vertebral arteries to the skull base. Common carotids, carotid bifurcations, and cervical ICAs appear within normal limits. No evidence of hemodynamically significant cervical carotid stenosis. The left vertebral artery appears dominant throughout the neck. And the right vertebral artery appears non dominant throughout. No evidence of hemodynamically significant stenosis. There is a partially retropharyngeal course of the right carotid. MRA HEAD FINDINGS Antegrade flow in the posterior circulation with dominant left vertebral artery supplying the basilar. On the neck MRA images the non dominant right vertebral artery may functionally terminates in muscular branches outside of the skull. However, axial T2 brain appearance suggests there is an occluded right vertebral artery V4 segment. Normal left PICA origin. No basilar stenosis. Patent SCA and right PCA origins. Fetal type left PCA origin. Bilateral PCA branches are within normal limits. Next trache that Antegrade flow in both ICA siphons. Tortuous cavernous ICAs. No siphon stenosis identified. Ophthalmic and left posterior communicating artery origins appear normal. Patent carotid termini, MCA and ACA origins. Diminutive or absent anterior communicating artery. There is a moderate ACA A2 segment stenosis on the right (series 1017, image 12). But otherwise the visible ACA  branches are within normal limits. MCA M1 segments and MCA bi/trifurcations appear patent without stenosis. No MCA branch occlusion or stenosis identified. IMPRESSION: 1. Positive for acute to subacute scattered small infarcts in multiple vascular territories (left cerebellum, left MCA and bilateral PCA) raising the possibility of a recent embolic event. No associated hemorrhage or mass effect. The most acute appearing ischemia is in the left centrum semiovale. 2. 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 But no other  occlusion or large vessel stenosis is identified on noncontrast MRA. There is a moderate stenosis of the right ACA A2. The left vertebral artery is dominant and there is a fetal type left PCA origin. 3. Otherwise mild to moderate for age chronic small vessel disease in the brain, most pronounced in the left caudate. Electronically Signed   By: Genevie Ann M.D.   On: 10/11/2021 06:34   MR LUMBAR SPINE WO CONTRAST  Result Date: 10/11/2021 CLINICAL DATA:  60 year old male with right lower extremity pain and weakness. Chronic kidney disease, renal transplant. EXAM: MRI LUMBAR SPINE WITHOUT CONTRAST TECHNIQUE: Multiplanar, multisequence MR imaging of the lumbar spine was performed. No intravenous contrast was administered. COMPARISON:  Brain MRI today reported separately. CT Abdomen and Pelvis 10/09/2021. Abdominal radiographs 06/24/2014. FINDINGS: Segmentation:  Normal on the recent CT. Alignment: Stable lumbar lordosis. Mild straightening with no significant spondylolisthesis. Vertebrae: Abnormal heterogeneous signal in the L1-L2 disc space, where pronounced vacuum disc was present on the recent CT. STIR and T2 hyperintensity in addition to gas within the disc space now (series 14, image 9). But mostly chronic appearing degenerative endplate marrow signal changes there. No convincing marrow edema. Mildly heterogeneous signal also in the L3-L4 disc with vacuum phenomena there on the recent CT also. Background bone marrow signal within normal limits. No acute osseous abnormality identified. Conus medullaris and cauda equina: Conus extends to the T12-L1 level. No lower spinal cord or conus signal abnormality. Normal spinal canal patency at most levels. Unremarkable noncontrast cauda equina nerve root appearance. Paraspinal and other soft tissues: Stable visible abdominal viscera from the recent CT. Negative paraspinal soft tissues. Disc levels: T11-T12: Negative. T12-L1:  Negative. L1-L2: Disc space loss with  circumferential disc osteophyte complex eccentric to the left. Mild spinal and left lateral recess stenosis (left L2 nerve level). Mild left L1 foraminal stenosis. L2-L3: Mild circumferential disc bulge and endplate spurring. No significant stenosis. L3-L4: Mild disc space loss with circumferential disc bulge and endplate spurring. Bulky foraminal involvement greater on the right. No spinal or lateral recess stenosis. Mild to moderate right L3 foraminal stenosis. L4-L5: Circumferential disc bulge with broad-based posterior and foraminal involvement. Mild facet and ligament flavum hypertrophy. No spinal or convincing lateral recess stenosis. Moderate bilateral L4 foraminal stenosis. L5-S1: Disc space loss. Circumferential disc bulge with endplate spurring eccentric to the right. Mild facet hypertrophy. No spinal or convincing lateral recess stenosis. Moderate to severe bilateral L5 foraminal stenosis. IMPRESSION: 1. Abnormal signal in the L1-L2 disc appears to be degenerative in nature. No convincing discitis osteomyelitis at this time. But if there is progressive back pain or bacteremia than repeat MRI would be recommended in 2-3 weeks to exclude infection. 2. Multilevel lumbar disc and endplate degeneration. Isolated mild spinal and left lateral recess stenosis at L1-L2. Neural foraminal stenosis is moderate at the right L3, bilateral L4 nerve levels, and moderate to severe at the bilateral L5 nerve levels. Electronically Signed   By: Genevie Ann M.D.   On:  10/11/2021 06:41   DG Chest Port 1 View  Result Date: 10/09/2021 CLINICAL DATA:  Status post AVR and CABG. EXAM: PORTABLE CHEST 1 VIEW COMPARISON:  10/08/2021 FINDINGS: Postop change from median sternotomy, CABG procedure and aortic valve repair. Left IJ Cordis is again noted. Stable cardiomediastinal contours. No pneumothorax identified. No signs of pleural effusion or airspace consolidation. Atelectasis is noted within the left base. IMPRESSION: 1. No  pneumothorax. 2. Left base atelectasis. Electronically Signed   By: Kerby Moors M.D.   On: 10/09/2021 07:20   DG Chest Port 1 View  Result Date: 10/08/2021 CLINICAL DATA:  Follow-up exam. Status post CABG surgery on 10/06/2021. EXAM: PORTABLE CHEST 1 VIEW COMPARISON:  10/07/2021 and older exams. FINDINGS: Left greater than right lung base opacities consistent with a combination of atelectasis and pleural fluid. No convincing pulmonary edema. No pneumothorax. No mediastinal widening. Left internal jugular introducer sheath is stable. IMPRESSION: 1. No significant change from the previous day's exam. No acute findings and no evidence of an operative complication. 2. Left greater than right lung base opacities consistent with a combination small effusions and atelectasis. Electronically Signed   By: Lajean Manes M.D.   On: 10/08/2021 09:21   DG CHEST PORT 1 VIEW  Result Date: 10/07/2021 CLINICAL DATA:  Evaluate for pneumothorax. EXAM: PORTABLE CHEST 1 VIEW COMPARISON:  10/06/2021 FINDINGS: Interval removal of left chest tube. No appreciable pneumothorax identified. Previous median sternotomy, aortic valve repair and CABG procedure. Stable cardiomediastinal contours. Small left pleural effusion and pulmonary vascular congestion is unchanged. Mild atelectasis noted in the left base. IMPRESSION: 1. No pneumothorax after removal of left chest tube. 2. Stable small left pleural effusion and pulmonary vascular congestion. 3. Left lung base atelectasis. Electronically Signed   By: Kerby Moors M.D.   On: 10/07/2021 09:52   DG Chest Port 1 View  Result Date: 10/06/2021 CLINICAL DATA:  Recent AVR and single-vessel CABG. EXAM: PORTABLE CHEST 1 VIEW COMPARISON:  Portable chest yesterday at 10:13 p.m. FINDINGS: 3:24 a.m., 10/06/2021. The swan Ganz catheter has been pulled back into the pulmonary outflow tract. Left chest tube and mediastinal drain positioning are stable. No pneumothorax is seen. NGT enters the  stomach but the proximal side hole is just below the hiatus and could be advanced further in 10 cm for better positioning. ETT tube is 3.4 cm from the carina. Sternotomy sutures and aortic valve prosthesis are again shown. The heart is enlarged. Mild central vascular prominence continues to be seen, mild interstitial edema in the bases. There are small pleural effusions, with basilar interstitial haziness which could be atelectasis, pneumonitis or ground-glass edema. The mid and upper lungs show no focal opacities. Overall aeration is unchanged. IMPRESSION: The only noteworthy change is that the Swan-Ganz catheter has been pulled back into the pulmonary outflow tract. There is no interval change in the lung fields as described above or in other support device placements. Electronically Signed   By: Telford Nab M.D.   On: 10/06/2021 04:55   DG CHEST PORT 1 VIEW  Result Date: 10/05/2021 CLINICAL DATA:  Endotracheal tube placement EXAM: PORTABLE CHEST 1 VIEW COMPARISON:  10/05/2021 FINDINGS: Postoperative changes in the mediastinum. Endotracheal tube is present at the thoracic inlet with tip measuring 8.8 cm above the carina. Enteric tube tip is off the field of view but below the left hemidiaphragm. Left Swan-Ganz catheter with tip over the right pulmonary artery. Mediastinal drains. Left chest tube. Cardiac enlargement with pulmonary vascular congestion. Mild perihilar edema  appears to be improving since previous study. Small bilateral pleural effusions. No pneumothorax. IMPRESSION: Endotracheal tube tip is at the thoracic inlet, measuring 8.8 cm above the carina. Cardiac enlargement with pulmonary vascular congestion. Improving perihilar edema. Small pleural effusions. Electronically Signed   By: Lucienne Capers M.D.   On: 10/05/2021 22:24   DG Chest Port 1 View  Result Date: 10/05/2021 CLINICAL DATA:  Status post aortic valve repair and CABG. EXAM: PORTABLE CHEST 1 VIEW COMPARISON:  10/03/2021  FINDINGS: Postoperative changes from median sternotomy, aortic valve repair and CABG procedure identified. Mediastinal drain and left chest tube are in place. No pneumothorax visualized. Swan-Ganz catheter tip projects over the proximal right main pulmonary artery. There is a nasogastric tube in place with tip and side port below the level of the GE junction. Heart size appears within normal limits. Hazy opacification within the left upper lobe is identified which is favored to represent an area of asymmetric pulmonary edema. IMPRESSION: 1. Satisfactory position of support apparatus. 2. Left upper lobe hazy opacification, favored to represent area of asymmetric pulmonary edema. Electronically Signed   By: Kerby Moors M.D.   On: 10/05/2021 15:02   DG Abd Portable 1V  Result Date: 10/06/2021 CLINICAL DATA:  Check enteric tube positioning. EXAM: PORTABLE ABDOMEN - 1 VIEW COMPARISON:  None. FINDINGS: Limited abdomen film, 3:21 a.m. on 10/06/2021 is compared to the contemporaneous chest film. This film only includes the left upper abdomen and left mid to lower lung field. NGT is in place with the proximal side-hole just below the hiatus and could be advanced further in 10 cm for better placement. Intrathoracic support devices and tubes are as described on the prior chest film report. No dilated bowel or gastric dilatation is seen in the visualized portion of the left upper abdomen. There is no supine evidence of free air. No interval change in the visualized lung fields. IMPRESSION: Enteric tube positioning as described above. Electronically Signed   By: Telford Nab M.D.   On: 10/06/2021 05:48   ECHO INTRAOPERATIVE TEE  Result Date: 10/05/2021  *INTRAOPERATIVE TRANSESOPHAGEAL REPORT *  Patient Name:   HAIDYN CHADDERDON Date of Exam: 10/05/2021 Medical Rec #:  [id number]        Height:       71.0 in Accession #:    [id number]       Weight:       293.5 lb Date of Birth:  Jan 08, 1961        BSA:          2.48 m  Patient Age:    49 years         BP:           152/79 mmHg Patient Gender: M                HR:           72 bpm. Exam Location:  Anesthesiology Transesophogeal exam was perform intraoperatively during surgical procedure. Patient was closely monitored under general anesthesia during the entirety of examination. Indications:     Coronary artery disease involving native coronary artery of                  native heart with angina pectoris ([place]) [I25.119 (ICD-10-CM)];                  Aortic valve stenosis, etiology of cardiac valve disease  unspecified [I35.0 (ICD-10-CM)] Performing Phys: [id number] BARTLE Diagnosing Phys: Hoy Morn MD Complications: No known complications during this procedure. POST-OP IMPRESSIONS _ Left Ventricle: has mildly reduced systolic function, with an ejection fraction of 50%. _ Right Ventricle: The right ventricle appears unchanged from pre-bypass. _ Aorta: The aorta appears unchanged from pre-bypass. _ Left Atrial Appendage: The left atrial appendage appears unchanged from pre-bypass. _ Aortic Valve: A bioprosthetic bioprosthetic valve was placed, leaflets are freely mobile and leaflets thin Manufactured by; [employer] Size; 30mm. _ Mitral Valve: The mitral valve appears unchanged from pre-bypass. There is mild regurgitation. _ Tricuspid Valve: There is mild regurgitation. _ Pulmonic Valve: The pulmonic valve appears unchanged from pre-bypass. _ Interatrial Septum: The interatrial septum appears unchanged from pre-bypass. _ Pericardium: The pericardium appears unchanged from pre-bypass. _ Comments: Post-bypass images reviewed with surgeon. PRE-OP FINDINGS  Left Ventricle: The left ventricle has low normal systolic function, with an ejection fraction of 50-55%. The cavity size was normal. There is moderate concentric left ventricular hypertrophy. Right Ventricle: The right ventricle has mildly reduced systolic function. The cavity was normal. There is no increase in right  ventricular wall thickness. Left Atrium: Left atrial size was dilated. No left atrial/left atrial appendage thrombus was detected. Right Atrium: Right atrial size was normal in size. Interatrial Septum: Evidence of atrial level shunting detected by color flow Doppler. A small patent foramen ovale is detected with predominantly left to right shunting across the atrial septum. Pericardium: There is no evidence of pericardial effusion. Mitral Valve: Abnormal. Mitral valve regurgitation is mild by color flow Doppler. There is mild thickening and mild calcification present. Tricuspid Valve: The tricuspid valve was normal in structure. Tricuspid valve regurgitation is trivial by color flow Doppler. Aortic Valve: The aortic valve is tricuspid Aortic valve regurgitation was not visualized by color flow Doppler. There is severe stenosis of the aortic valve, with a calculated valve area of 0.99 cm. There is moderate aortic annular calcification noted. There is moderate thickening and moderate calcification present with moderately decreased mobility. Pulmonic Valve: The pulmonic valve was normal in structure. Pulmonic valve regurgitation is not visualized by color flow Doppler. Aorta: The aortic root and ascending aorta are normal in size and structure. There is evidence of plaque in the descending aorta. Pulmonary Artery: The pulmonary artery is of normal size. +--------------+--------++  LEFT VENTRICLE            +--------------+--------++  PLAX 2D                   +--------------+--------++  LVIDd:         5.40 cm    +--------------+--------++  LVIDs:         3.85 cm    +--------------+--------++  LVOT diam:     2.40 cm    +--------------+--------++  LV SV:         77 ml      +--------------+--------++  LV SV Index:   29.31      +--------------+--------++  LVOT Area:     4.52 cm   +--------------+--------++                            +--------------+--------++ +------------------+------------++  AORTIC VALVE                       +------------------+------------++  AV Area (Vmax):    1.05 cm       +------------------+------------++  AV  Area (Vmean):   1.16 cm       +------------------+------------++  AV Area (VTI):     0.99 cm       +------------------+------------++  AV Vmax:           420.00 cm/s    +------------------+------------++  AV Vmean:          273.000 cm/s   +------------------+------------++  AV VTI:            0.968 m        +------------------+------------++  AV Peak Grad:      70.6 mmHg      +------------------+------------++  AV Mean Grad:      35.0 mmHg      +------------------+------------++  LVOT Vmax:         97.90 cm/s     +------------------+------------++  LVOT Vmean:        69.900 cm/s    +------------------+------------++  LVOT VTI:          0.212 m        +------------------+------------++  LVOT/AV VTI ratio: 0.22           +------------------+------------++  +-------------+-------++  AORTA                   +-------------+-------++  Ao Root diam: 3.30 cm   +-------------+-------++  Ao Asc diam:  3.40 cm   +-------------+-------++ +-------------+---------++  MITRAL VALVE              +--------------+-------+ +-------------+---------++  SHUNTS                   MV Peak grad: 6.6 mmHg    +--------------+-------+ +-------------+---------++  Systemic VTI:  0.21 m    MV Mean grad: 2.0 mmHg    +--------------+-------+ +-------------+---------++  Systemic Diam: 2.40 cm   MV Vmax:      1.28 m/s    +--------------+-------+ +-------------+---------++  MV Vmean:     59.1 cm/s   +-------------+---------++  MV VTI:       0.37 m      +-------------+---------++  Hoy Morn MD Electronically signed by Hoy Morn MD Signature Date/Time: 10/05/2021/5:52:06 PM    Final    VAS US DOPPLER PRE CABG  Result Date: 10/03/2021 PREOPERATIVE VASCULAR EVALUATION Patient Name:  DIO GILLER  Date of Exam:   10/03/2021 Medical Rec #: [id number]         Accession #:    [id number] Date of Birth: 06/05/1961         Patient  Gender: M Patient Age:   43 years Exam Location:  [facility] Procedure:      VAS US DOPPLER PRE CABG Referring Phys: Gilford Raid --------------------------------------------------------------------------------  Indications:            Pre-CABG. Risk Factors:           Hypertension, hyperlipidemia, Diabetes. Vascular Interventions: Right arm fistula. Performing Technologist: Archie Patten RVS  Examination Guidelines: A complete evaluation includes B-mode imaging, spectral Doppler, color Doppler, and power Doppler as needed of all accessible portions of each vessel. Bilateral testing is considered an integral part of a complete examination. Limited examinations for reoccurring indications may be performed as noted.  Right Carotid Findings: +----------+--------+--------+--------+------------+--------+             PSV cm/s EDV cm/s Stenosis Describe     Comments  +----------+--------+--------+--------+------------+--------+  CCA Prox   76       12  heterogenous           +----------+--------+--------+--------+------------+--------+  CCA Distal 61       11                heterogenous           +----------+--------+--------+--------+------------+--------+  ICA Prox   46       10       1-39%    heterogenous           +----------+--------+--------+--------+------------+--------+  ICA Distal 61       24                                       +----------+--------+--------+--------+------------+--------+  ECA        84                                                +----------+--------+--------+--------+------------+--------+ +----------+--------+-------+--------+------------+             PSV cm/s EDV cms Describe Arm Pressure  +----------+--------+-------+--------+------------+  Subclavian 112                                     +----------+--------+-------+--------+------------+ +---------+--------+--+--------+-+---------+  Vertebral PSV cm/s 38 EDV cm/s 5 Antegrade   +---------+--------+--+--------+-+---------+ Left Carotid Findings: +----------+--------+--------+--------+------------+--------+             PSV cm/s EDV cm/s Stenosis Describe     Comments  +----------+--------+--------+--------+------------+--------+  CCA Prox   82       15                heterogenous           +----------+--------+--------+--------+------------+--------+  CCA Distal 72       10                heterogenous           +----------+--------+--------+--------+------------+--------+  ICA Prox   36       11       1-39%    heterogenous           +----------+--------+--------+--------+------------+--------+  ICA Distal 79       26                                       +----------+--------+--------+--------+------------+--------+  ECA        86                                                +----------+--------+--------+--------+------------+--------+ +----------+--------+--------+--------+------------+  Subclavian PSV cm/s EDV cm/s Describe Arm Pressure  +----------+--------+--------+--------+------------+             74                                       +----------+--------+--------+--------+------------+ +---------+--------+--+--------+--+---------+  Vertebral PSV cm/s 44 EDV cm/s 11 Antegrade  +---------+--------+--+--------+--+---------+  ABI Findings: +--------+------------------+-----+---------+--------+  Right  Rt Pressure (mmHg) Index Waveform  Comment   +--------+------------------+-----+---------+--------+  Brachial                                    fistula   +--------+------------------+-----+---------+--------+  ATA                               triphasic           +--------+------------------+-----+---------+--------+  PTA                               triphasic           +--------+------------------+-----+---------+--------+ +--------+------------------+-----+---------+-------+  Left     Lt Pressure (mmHg) Index Waveform  Comment  +--------+------------------+-----+---------+-------+   Brachial                          triphasic          +--------+------------------+-----+---------+-------+  ATA                               triphasic          +--------+------------------+-----+---------+-------+  PTA                               triphasic          +--------+------------------+-----+---------+-------+  Right Doppler Findings: +--------+--------+-----+--------+--------+  Site     Pressure Index Doppler  Comments  +--------+--------+-----+--------+--------+  Brachial                         fistula   +--------+--------+-----+--------+--------+  Radial                  biphasic           +--------+--------+-----+--------+--------+  Ulnar                   biphasic           +--------+--------+-----+--------+--------+  Left Doppler Findings: +--------+--------+-----+---------+--------+  Site     Pressure Index Doppler   Comments  +--------+--------+-----+---------+--------+  Brachial                triphasic           +--------+--------+-----+---------+--------+  Radial                  triphasic           +--------+--------+-----+---------+--------+  Ulnar                   triphasic           +--------+--------+-----+---------+--------+  Summary: Right Carotid: Velocities in the right ICA are consistent with a 1-39% stenosis. Left Carotid: Velocities in the left ICA are consistent with a 1-39% stenosis. Vertebrals: Bilateral vertebral arteries demonstrate antegrade flow. Right Upper Extremity: Doppler waveforms remain within normal limits with right radial compression. Doppler waveforms remain within normal limits with right ulnar compression. Left Upper Extremity: Doppler waveforms remain within normal limits with left radial compression. Doppler waveforms remain within normal limits with left ulnar compression.  Electronically signed by Coral Else MD on 10/03/2021 at 6:41:47 PM.    Final  Discharge Instructions     Discharge patient   Complete by: As directed    Discharge disposition:  03-Skilled Nursing Facility   Discharge patient date: 10/18/2021       Discharge Medications: Allergies as of 10/18/2021   Not on File      Medication List     STOP taking these medications    Accu-Chek FastClix Lancet Kit   Accu-Chek FastClix Lancets Misc   Accu-Chek Guide w/Device Kit   aspirin EC 81 MG tablet   B-D DISP NEEDLE 25GX1" 25G X 1" Misc Generic drug: NEEDLE (DISP) 25 G   B-D UF III MINI PEN NEEDLES 31G X 5 MM Misc Generic drug: Insulin Pen Needle   BD Insulin Syringe U/F 31G X 5/16" 1 ML Misc Generic drug: Insulin Syringe-Needle U-100   Fifty50 Glucose Meter 2.0 w/Device Kit   FreeStyle Libre 3 Sensor Misc   furosemide 20 MG tablet Commonly known as: LASIX   glucose blood test strip Commonly known as: Accu-Chek Guide   HUMULIN R 500 UNIT/ML injection Generic drug: insulin regular human CONCENTRATED   Insulin Syringe-Needle U-100 31G X 5/16" 0.3 ML Misc Commonly known as: BD Insulin Syringe U/F   NovoLOG FlexPen 100 UNIT/ML FlexPen Generic drug: insulin aspart Replaced by: insulin aspart 100 UNIT/ML injection   rosuvastatin 20 MG tablet Commonly known as: Horticulturist, commercial Lock 3 ML Misc   traMADol 50 MG tablet Commonly known as: ULTRAM   UNABLE TO FIND       TAKE these medications    acetaminophen 325 MG tablet Commonly known as: TYLENOL Take 2 tablets (650 mg total) by mouth every 4 (four) hours as needed for moderate pain.   amiodarone 200 MG tablet Commonly known as: PACERONE Take 1 tablet (200 mg total) by mouth 2 (two) times daily.   amLODipine 10 MG tablet Commonly known as: NORVASC TAKE 1 TABLET BY MOUTH EVERY DAY   amoxicillin 500 MG capsule Commonly known as: AMOXIL Take 2,000 mg by mouth See admin instructions. Take 4 capsules (2000 mg) by mouth 1 hour prior to dental appointments.   atorvastatin 80 MG tablet Commonly known as: LIPITOR Take 1 tablet (80 mg total) by mouth daily. Start taking on: October 19, 2021   cholecalciferol 25 MCG (1000 UNIT) tablet Commonly known as: VITAMIN D3 Take 1,000 Units by mouth in the morning and at bedtime.   DULoxetine 60 MG capsule Commonly known as: CYMBALTA Take 60 mg by mouth daily.   fluocinonide cream 0.05 % Commonly known as: LIDEX Apply 1 application topically daily as needed (Skin inflammation (hand, thigh, back)).   gabapentin 100 MG capsule Commonly known as: NEURONTIN Take 200-300 mg by mouth See admin instructions. Take 2 capsules (200 mg) by mouth in the morning and take 3 capsules (300 mg) by mouth at bedtime   hydrALAZINE 25 MG tablet Commonly known as: APRESOLINE Take 1 tablet (25 mg total) by mouth 3 (three) times daily.   insulin aspart 100 UNIT/ML injection Commonly known as: novoLOG Inject 0-9 Units into the skin 3 (three) times daily with meals. Replaces: NovoLOG FlexPen 100 UNIT/ML FlexPen   insulin detemir 100 UNIT/ML injection Commonly known as: LEVEMIR Inject 0.38 mLs (38 Units total) into the skin 2 (two) times daily.   lactulose 10 GM/15ML solution Commonly known as: CHRONULAC Take 10-20 g by mouth daily as needed (constipation).   levothyroxine 100 MCG tablet Commonly known as: SYNTHROID TAKE 1 TABLET BY MOUTH EVERY DAY  lisinopril 40 MG tablet Commonly known as: ZESTRIL Take 1 tablet (40 mg total) by mouth daily. Start taking on: October 19, 2021 What changed:  medication strength how much to take when to take this   metoprolol tartrate 50 MG tablet Commonly known as: LOPRESSOR Take 1 tablet (50 mg total) by mouth 2 (two) times daily.   mycophenolate 250 MG capsule Commonly known as: CELLCEPT Take 1,000 mg by mouth every 12 (twelve) hours.   Nulojix 250 MG Solr injection Generic drug: belatacept Inject 24.5 mLs into the vein every 28 (twenty-eight) days.   oxyCODONE 5 MG immediate release tablet Commonly known as: Oxy IR/ROXICODONE Take 1 tablet (5 mg total) by mouth every 6 (six) hours as  needed for up to 7 days for severe pain.   predniSONE 5 MG tablet Commonly known as: DELTASONE Take 5 mg by mouth daily with breakfast.   senna-docusate 8.6-50 MG tablet Commonly known as: Senokot-S Take 1 tablet by mouth as needed for mild constipation.   tamsulosin 0.4 MG Caps capsule Commonly known as: FLOMAX Take 0.8 mg by mouth at bedtime.   testosterone cypionate 200 MG/ML injection Commonly known as: DEPOTESTOSTERONE CYPIONATE Inject 0.6 mLs (120 mg total) into the muscle every 7 (seven) days.   traZODone 50 MG tablet Commonly known as: DESYREL Take 50-100 mg by mouth at bedtime as needed for sleep.   warfarin 2.5 MG tablet Commonly known as: COUMADIN Take 1 tablet (2.5 mg total) by mouth daily at 4 PM.   zolpidem 10 MG tablet Commonly known as: AMBIEN Take 10 mg by mouth at bedtime as needed for sleep.       The patient has been discharged on:   1.Beta Blocker:  Yes [  y ]                              No   [   ]                              If No, reason:  2.Ace Inhibitor/ARB: Yes [  y ]                                     No  [    ]                                     If No, reason:  3.Statin:   Yes Blue.Reese   ]                  No  [   ]                  If No, reason:  4.Ecasa:  Yes  [   ]                  No   [   n]                  If No, reason:on coumadin  Patient had ACS upon admission:No  Plavix/P2Y12 inhibitor: Yes [   ]  No  [  x ]   Follow Up Appointments:  Follow-up Information     [name], MD. Go on 11/08/2021.   Specialty: Cardiothoracic Surgery Why: PA/LAT CXR to be taken (at [facility] which is in the same building as Dr. Vivi Martens office) on 02/01 at 12:00 pm;Appointment time is at 12:30 pm Contact information: [address] [place] [address] [phone]         [facility] . Go on 10/20/2021.   Specialty:  Cardiothoracic Surgery Why: Appointment is with nurse only to have chest tube sutures removed. Appointment time is at 11:00 am Contact information: [place], [place]        Troy Sine, MD. Go on 11/08/2021.   Specialty: Cardiology Why: Appointment time is at 9:40 am Contact information: [place] [address] [facility]. Go on 11/27/2021.   Specialty: Cardiology Why: Appointment time is at 1:05 pm Contact information: [address] [id number] [place] [address] [place], [place], DO Follow up.   Specialty: Neurology Why: You will receive an appointment for outpatient follow-up with neurologist. Please contact office if you have not heard from them withon 2 weeks. Contact information: [place] [phone]                 Signed: Gaspar Bidding 10/18/2021, 2:31 PM

## 2021-10-06 NOTE — Procedures (Signed)
Extubation Procedure Note  Patient Details:   Name: GIANNY SABINO DOB: 1960-11-26 MRN: [id number]   Airway Documentation:    Vent end date: 10/06/21 Vent end time: 0855   Evaluation  O2 sats: stable throughout Complications: No apparent complications Patient did tolerate procedure well. Bilateral Breath Sounds: Clear, Diminished   Yes  Jerin Franzel 10/06/2021, 8:56 AM

## 2021-10-07 ENCOUNTER — Inpatient Hospital Stay (HOSPITAL_COMMUNITY): Payer: 59

## 2021-10-07 DIAGNOSIS — I639 Cerebral infarction, unspecified: Secondary | ICD-10-CM

## 2021-10-07 LAB — BASIC METABOLIC PANEL
Anion gap: 11 (ref 5–15)
BUN: 43 mg/dL — ABNORMAL HIGH (ref 8–23)
CO2: 21 mmol/L — ABNORMAL LOW (ref 22–32)
Calcium: 7.7 mg/dL — ABNORMAL LOW (ref 8.9–10.3)
Chloride: 102 mmol/L (ref 98–111)
Creatinine, Ser: 2.91 mg/dL — ABNORMAL HIGH (ref 0.61–1.24)
GFR, Estimated: 24 mL/min — ABNORMAL LOW (ref >60)
Glucose, Bld: 107 mg/dL — ABNORMAL HIGH (ref 70–99)
Potassium: 5.1 mmol/L (ref 3.5–5.1)
Sodium: 134 mmol/L — ABNORMAL LOW (ref 135–145)

## 2021-10-07 LAB — COOXEMETRY PANEL
Carboxyhemoglobin: 1.6 % — ABNORMAL HIGH (ref 0.5–1.5)
Methemoglobin: 0.8 % (ref 0.0–1.5)
O2 Saturation: 79.6 %
Total hemoglobin: 12.3 g/dL (ref 12.0–16.0)

## 2021-10-07 LAB — C3 COMPLEMENT: C3 Complement: 78 mg/dL — ABNORMAL LOW (ref 82–167)

## 2021-10-07 LAB — CBC
HCT: 37.3 % — ABNORMAL LOW (ref 39.0–52.0)
Hemoglobin: 12 g/dL — ABNORMAL LOW (ref 13.0–17.0)
MCH: 29.4 pg (ref 26.0–34.0)
MCHC: 32.2 g/dL (ref 30.0–36.0)
MCV: 91.4 fL (ref 80.0–100.0)
Platelets: 164 10*3/uL (ref 150–400)
RBC: 4.08 MIL/uL — ABNORMAL LOW (ref 4.22–5.81)
RDW: 15.5 % (ref 11.5–15.5)
WBC: 14.2 10*3/uL — ABNORMAL HIGH (ref 4.0–10.5)
nRBC: 0 % (ref 0.0–0.2)

## 2021-10-07 LAB — GLUCOSE, CAPILLARY
Glucose-Capillary: 109 mg/dL — ABNORMAL HIGH (ref 70–99)
Glucose-Capillary: 108 mg/dL — ABNORMAL HIGH (ref 70–99)
Glucose-Capillary: 155 mg/dL — ABNORMAL HIGH (ref 70–99)
Glucose-Capillary: 146 mg/dL — ABNORMAL HIGH (ref 70–99)
Glucose-Capillary: 180 mg/dL — ABNORMAL HIGH (ref 70–99)

## 2021-10-07 MED ORDER — AMIODARONE HCL IN DEXTROSE 360-4.14 MG/200ML-% IV SOLN
60.0000 mg/h | INTRAVENOUS | Status: AC
  Administered 2021-10-07 (×2): 60 mg/h via INTRAVENOUS
  Filled 2021-10-07 (×2): qty 200

## 2021-10-07 MED ORDER — LEVOTHYROXINE SODIUM 100 MCG PO TABS
100.0000 ug | ORAL_TABLET | Freq: Every day | ORAL | Status: DC
  Administered 2021-10-07 – 2021-10-18 (×12): 100 ug via ORAL
  Filled 2021-10-07 (×12): qty 1

## 2021-10-07 MED ORDER — INSULIN ASPART 100 UNIT/ML IJ SOLN
0.0000 [IU] | Freq: Three times a day (TID) | INTRAMUSCULAR | Status: DC
  Administered 2021-10-07: 2 [IU] via SUBCUTANEOUS
  Administered 2021-10-07: 17:00:00 1 [IU] via SUBCUTANEOUS
  Administered 2021-10-08: 17:00:00 2 [IU] via SUBCUTANEOUS
  Administered 2021-10-08: 1 [IU] via SUBCUTANEOUS
  Administered 2021-10-09: 2 [IU] via SUBCUTANEOUS
  Administered 2021-10-09 – 2021-10-10 (×2): 1 [IU] via SUBCUTANEOUS
  Administered 2021-10-10 – 2021-10-11 (×3): 3 [IU] via SUBCUTANEOUS
  Administered 2021-10-11: 2 [IU] via SUBCUTANEOUS
  Administered 2021-10-11 – 2021-10-12 (×3): 1 [IU] via SUBCUTANEOUS
  Administered 2021-10-12 – 2021-10-13 (×2): 2 [IU] via SUBCUTANEOUS
  Administered 2021-10-13: 3 [IU] via SUBCUTANEOUS
  Administered 2021-10-14: 1 [IU] via SUBCUTANEOUS
  Administered 2021-10-14 (×2): 2 [IU] via SUBCUTANEOUS
  Administered 2021-10-15: 3 [IU] via SUBCUTANEOUS
  Administered 2021-10-15: 2 [IU] via SUBCUTANEOUS
  Administered 2021-10-15 – 2021-10-17 (×4): 1 [IU] via SUBCUTANEOUS
  Administered 2021-10-17 – 2021-10-18 (×3): 2 [IU] via SUBCUTANEOUS

## 2021-10-07 MED ORDER — TAMSULOSIN HCL 0.4 MG PO CAPS
0.8000 mg | ORAL_CAPSULE | Freq: Every day | ORAL | Status: DC
  Administered 2021-10-07 – 2021-10-17 (×11): 0.8 mg via ORAL
  Filled 2021-10-07 (×12): qty 2

## 2021-10-07 MED ORDER — ALPRAZOLAM 0.25 MG PO TABS
0.2500 mg | ORAL_TABLET | Freq: Two times a day (BID) | ORAL | Status: DC
  Administered 2021-10-07 – 2021-10-18 (×22): 0.25 mg via ORAL
  Filled 2021-10-07 (×23): qty 1

## 2021-10-07 MED ORDER — AMIODARONE HCL IN DEXTROSE 360-4.14 MG/200ML-% IV SOLN
30.0000 mg/h | INTRAVENOUS | Status: DC
  Administered 2021-10-08 – 2021-10-09 (×4): 30 mg/h via INTRAVENOUS
  Filled 2021-10-07 (×3): qty 200

## 2021-10-07 MED ORDER — AMLODIPINE BESYLATE 10 MG PO TABS
10.0000 mg | ORAL_TABLET | Freq: Every day | ORAL | Status: DC
  Administered 2021-10-07 – 2021-10-18 (×12): 10 mg via ORAL
  Filled 2021-10-07 (×12): qty 1

## 2021-10-07 MED ORDER — TRAZODONE HCL 50 MG PO TABS
50.0000 mg | ORAL_TABLET | Freq: Every evening | ORAL | Status: DC | PRN
  Administered 2021-10-07 – 2021-10-11 (×3): 50 mg via ORAL
  Filled 2021-10-07 (×3): qty 1

## 2021-10-07 MED ORDER — AMIODARONE LOAD VIA INFUSION
150.0000 mg | Freq: Once | INTRAVENOUS | Status: AC
  Administered 2021-10-07: 150 mg via INTRAVENOUS
  Filled 2021-10-07: qty 83.34

## 2021-10-07 MED ORDER — ZOLPIDEM TARTRATE 5 MG PO TABS
10.0000 mg | ORAL_TABLET | Freq: Every evening | ORAL | Status: DC | PRN
  Administered 2021-10-07 – 2021-10-15 (×4): 10 mg via ORAL
  Filled 2021-10-07 (×5): qty 2

## 2021-10-07 NOTE — Progress Notes (Signed)
Pt. Became extremely anxious and took his bipap mask off. Pt. States that he was not getting any air. Pt. Requested to try his home settings which he states is 18/3. Pt. Was on 12/6 and was tolerating well til his anxiety came about. Pt. Was placed on his home settings and tried the bipap for about 5 minutes until he became anxious again. RN is at the bedside trying to assist the pt. With his anxiety.

## 2021-10-07 NOTE — Progress Notes (Signed)
OT Evaluation  Patient is s/p CABG surgery resulting in functional limitations due to the deficits listed below (see OT problem list). Pt with R side weakness R UE and LE that is concerning at this time. MD notified. Pt with decreased coordination and strength noted. Pt requires hoyer lift to chair and pillow wedge for midline posture. Pt could benefit from R prevalon boot due to decreased sensation and movement ( high risk for heel break down.  Patient will benefit from skilled OT acutely to increase independence and safety with ADLS to allow discharge CIR.    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,  --   Restrictions  Weight Bearing Restrictions Yes  --   Home Living  Family/patient expects to be discharged to: Private residence  --   Living Arrangements Spouse/significant other  --   Available Help at Discharge Family  --   Type of [facility] to enter  --   Entrance Stairs-Number of Steps 3  --   Entrance Stairs-Rails None  --   Home Layout Two level;Able to live on main level with bedroom/bathroom  --   Alternate Level Stairs-Number of Steps 2 flights of steps  --   Alternate Level Stairs-Rails Right  --   Bathroom Shower/Tub Walk-in shower  --   Designer, jewellery  --   Home Equipment Shower seat - built in;Hand held shower head;Grab bars - tub/shower (suction grab bars in  bathroom)  --   Additional Comments animals- 12lb yogi the yorkie  --   Prior Function  Prior Level of Function  Independent/Modified Independent;Working/employed;Driving  --   Mobility Comments decreased sensation in feet at baseline. wife reports pt walking out of his shoe and no awareness to it coming off  --   ADLs Comments indep per pt report  --   Communication  Communication No difficulties  --   Pain Assessment  Pain Assessment Faces  --   Faces Pain Scale 6  --   Pain Location chest with movement  --   Pain Descriptors / Indicators Discomfort;Operative site guarding  --   Pain Intervention(s) Monitored during session;Repositioned;Limited activity within patient's tolerance  --   Cognition  Arousal/Alertness Awake/alert  --   Behavior During Therapy WFL for tasks assessed/performed  --   Overall Cognitive Status Within Functional Limits for tasks assessed  --   General Comments needs more executive level testings - med cog next session  --   Upper Extremity Assessment  Upper Extremity Assessment RUE deficits/detail  --   RUE Deficits / Details ataxic movement decreases shoulder flexion ~45 degrees against gravity. pt with decreased finger to nose. pt is L handed but R hand is very "clumpsy" per patient  --   RUE Coordination decreased fine motor;decreased gross motor  --   Lower Extremity Assessment  Lower Extremity  Assessment [name] deficits/detail  --   [name] Deficits / Details able to complete knee extension but no knee flexion. pt requires extensive input at the foot to initiate toe wiggle without ability to to repeat. no dorisflexion or plantar flexion noted,  --   [name] Sensation history of peripheral neuropathy;decreased light touch;decreased proprioception  --   Cervical / Trunk Assessment  Cervical / Trunk Assessment Other exceptions (rounded shoulders)  --   Vision- History  Baseline Vision/History 1 Wears glasses  --   Vision- Assessment  Additional Comments wears them  all the time  --   ADL  Overall ADL's  Needs assistance/impaired  --   Eating/Feeding Moderate assistance  --   Grooming Moderate assistance  --   Upper Body Bathing Maximal assistance  --   Lower Body Bathing Total assistance  --   Upper Body Dressing  Maximal assistance  --   Lower Body Dressing Total assistance  --   General ADL Comments bed level only due to R side weakness  --   Bed Mobility  Overal bed mobility Needs Assistance  --   Bed Mobility Supine to Sit;Sit to Supine  --   Supine to sit +2 for physical assistance;Max assist;HOB elevated  --   Sit to supine +2 for physical assistance;Max assist;HOB elevated  --   General bed mobility comments pt progressed to EOB with max cues and pad used to help pivot helicopter to eob sitting. pt static sitting with strong R lean. pt unable to static sit without max (A). pt return to supine and maxisky lifted to chair total (A)  --   Transfers  General transfer comment maxisky required  --   Balance  Overall balance assessment Needs assistance  --   Sitting-balance support Bilateral upper extremity supported;Feet supported  --   Sitting balance-Leahy Scale Zero  --   Sitting balance - Comments dependent on therapist support  --   General Comments  General comments (skin integrity, edema, etc.) VSS, RN present for session. pt entire R side weakness compared to the L side  --   Exercises  Exercises Other exercises  --   Other Exercises  Other Exercises edema noted throughout R arm and fistula in R UE. Pt able to complete fist and digit extension. pt with R UE elevated for edema management and encouraged to open/ close hand to help with edema as well  --   Other Exercises R Le risk for pressure wound. recommend Pravalon boot  --   OT - End of Session  Equipment Utilized During Treatment Oxygen  --   Activity Tolerance Patient tolerated treatment well  --   Patient left in chair;with call bell/phone within reach;with family/visitor  present;with nursing/sitter in room;with chair alarm set  --   Nurse Communication Mobility status;Precautions;Need for lift equipment  --   OT Assessment  OT Recommendation/Assessment Patient needs continued OT Services  --   OT Visit Diagnosis Unsteadiness on feet (R26.81);Muscle weakness (generalized) (M62.81)  --   OT Problem List Decreased strength;Decreased range of motion;Decreased activity tolerance;Impaired balance (sitting and/or standing);Decreased coordination;Decreased safety awareness;Decreased knowledge of use of DME or AE;Decreased knowledge of precautions;Cardiopulmonary status limiting activity;Impaired sensation;Obesity;Impaired UE functional use;Increased edema  --   OT Plan  OT Frequency (ACUTE ONLY) Min 2X/week  --   OT Treatment/Interventions (ACUTE ONLY) Self-care/ADL training;Therapeutic exercise;Neuromuscular education;Energy conservation;DME and/or AE instruction;Manual therapy;Modalities;Therapeutic activities;Patient/family education;Balance training  --   AM-PAC OT "6 Clicks" Daily Activity Outcome Measure (Version 2)  Help from another person eating meals? 2 2  Help from another person taking care of personal grooming? 2 2  Help from another person toileting, which includes using toliet, bedpan, or urinal? 1 1  Help from another person bathing (including washing, rinsing, drying)? 1 1  Help from another person to put on and taking off regular upper body clothing? 2 2  Help from another person to put on and taking off regular lower body clothing? 1 1  6  Click Score 9 9  Progressive Mobility  What is the highest level of mobility based on the progressive mobility assessment? Level 2 (Chairfast) - Balance while sitting on edge of bed and cannot stand  --   Mobility Sit up in bed/chair position for meals  --   OT Recommendation  Recommendations for Other Services Rehab consult;Speech consult  --   Follow Up Recommendations Acute inpatient rehab (3hours/day)  --    Assistance recommended at discharge Intermittent Supervision/Assistance  --   Functional Status Assessent Patient has had a recent decline in their functional status and demonstrates the ability to make significant improvements in function in a reasonable and predictable amount of time.  --   OT Equipment BSC/3in1;Wheelchair (measurements OT);Wheelchair cushion (measurements OT);Other (comment) (lift)  --   Individuals Consulted  Consulted and Agree with Results and Recommendations Patient;Family member/caregiver  --   Family Member Consulted wife  --   Acute Rehab OT Goals  Patient Stated Goal to sit up  --   OT Goal Formulation With patient/family  --   Time For Goal Achievement 10/21/21  --   Potential to Achieve Goals Good  --   OT Time Calculation  OT Start Time (ACUTE ONLY) 0855  --   OT Stop Time (ACUTE ONLY) 0926  --   OT Time Calculation (min) 31 min  --   OT General Charges  $OT Visit 1 Visit  --   OT Evaluation  $OT Eval High Complexity 1 High  --   Written Expression  Dominant Hand Left  --     Fleeta Emmer, OTR/L  Acute Rehabilitation Services Pager: [phone] Office: [phone] .

## 2021-10-07 NOTE — Progress Notes (Signed)
Inpatient Rehab Admissions Coordinator Note:   Per PT/OT patient was screened for CIR candidacy by Tiena Manansala Danford Bad, CCC-SLP. At this time, pt is not able to attempt out of bed mobility except with a mechanical lift. Pt may have potential to progress to becoming a potential CIR candidate, so CIR admissions team will follow and monitor progress with therapies from a distance.    Gayland Curry, MS, CCC-SLP Admissions Coordinator [phone] 10/07/21 1:50 PM

## 2021-10-07 NOTE — Consult Note (Signed)
NEURO HOSPITALIST CONSULT NOTE   Requestig physician: Dr. Cyndia Bent  Reason for Consult: New onset of [name] weakness following aortic valve replacement and LAD bypass on 12/29  History obtained from:  Patient and Chart     HPI:                                                                                                                                          Robert [name] is an 60 y.o. male with a PMHx of anemia, CAD, HLD, HTN, DM2, obesity, ESRD s/p renal transplant in 2018 now w/ CKD3b, hypothyroidism, neuropathy involving bilateral feet and right hand and sleep apnea who presented to the hospital for AVR and LAD bypass on 16/60 complicated by AKI. Today, PT noted the patient to have weakness in his [name] complicated by pain. A CT head was obtained and Neurology was consulted.  The patient states that his [name] has been weak and with painful movement ever since his renal transplantation procedure in 2018. However, the current right sided weakness is worse than his baseline, and was first noticed after he regained consciousness following heart surgery on 12/29. Prior to the surgery he was able to ambulate, whereas currently he cannot lift his [name] or bear weight on it.   CT head: No acute intracranial abnormality. Mild scattered subcortical white matter hypoattenuation bilaterally likely reflects the sequela of chronic microvascular ischemia.  Past Medical History:  Diagnosis Date   Anemia    low hgb at present   Arthritis    FEET   Blood transfusion without reported diagnosis 1974   kidney removed - L   Cancer ([place])    skin ca right shoulder, plastic dsyplasia, pre-Ca polpys removed on Colonoscopy- 07/2014   Colon polyps 07/23/2014   Tubular adenomas x 5   Constipation    Coronary artery disease    Diabetes mellitus without complication ([place])    Type 2   Difficult intubation    unsure of actual problem but it was during the January 28, 2015 procedure.   Dysrhythmia     Eczema    ESRD (end stage renal disease) ([place])    hemodialysis 06/2014-02/2017, s/p living unrelated kidney transplant 02/26/17   Headache(784.0)    slight dull h/a due kidney failure   Heart murmur    aortic stenosis (moderate-severe 04/2021)   Hyperlipidemia    Hypertension    SVT h/o , followed by Dr. Claiborne Billings   Hypothyroidism    Macular degeneration    Neuropathy    right hand and both feet   PAT (paroxysmal atrial tachycardia) ([facility])    PSVT (paroxysmal supraventricular tachycardia) ([facility])    SBO (small bowel obstruction) ([place]) 05/2015   Shortness of breath    with exertion   Sleep apnea  06/18/2011   split-night sleep study- [facility]., BiPAP- 13-16   Thyroid disease    Umbilical hernia    s/p repair 04/28/15    Past Surgical History:  Procedure Laterality Date   AORTIC VALVE REPLACEMENT N/A 10/05/2021   Procedure: AORTIC VALVE REPLACEMENT (AVR) WITH INSPIRIS RESILIA AORTIC VALVE SIZE 27MM;  Surgeon: [name], MD;  Location: [facility];  Service: Open Heart Surgery;  Laterality: N/A;   AV FISTULA PLACEMENT Right 02/25/2015   Procedure: RIGHT ARTERIOVENOUS (AV) FISTULA CREATION;  Surgeon: Serafina Mitchell, MD;  Location: [facility];  Service: Vascular;  Laterality: Right;   AV FISTULA PLACEMENT Right 09/15/2015   Procedure: ARTERIOVENOUS (AV) FISTULA CREATION- RIGHT ARM;  Surgeon: Serafina Mitchell, MD;  Location: [facility];  Service: Vascular;  Laterality: Right;   CAPD INSERTION N/A 05/18/2014   Procedure: LAPAROSCOPIC INSERTION CONTINUOUS AMBULATORY PERITONEAL DIALYSIS  (CAPD) CATHETER;  Surgeon: Ralene Ok, MD;  Location: [facility];  Service: General;  Laterality: N/A;   COLONOSCOPY     COLONOSCOPY WITH PROPOFOL N/A 07/12/2016   Procedure: COLONOSCOPY WITH PROPOFOL;  Surgeon: [name], MD;  [facility];  Service: Endoscopy;  Laterality: N/A;   CORONARY ARTERY BYPASS GRAFT N/A 10/05/2021   Procedure: CORONARY ARTERY BYPASS GRAFTING (CABG) X 1, ON  PUMP, USING LEFT INTERNAL MAMMARY ARTERY CONDUIT;  Surgeon: [name], MD;  Location: [facility];  Service: Open Heart Surgery;  Laterality: N/A;   declotting of fistula  08/2015   ESOPHAGOGASTRODUODENOSCOPY (EGD) WITH PROPOFOL N/A 07/12/2016   Procedure: ESOPHAGOGASTRODUODENOSCOPY (EGD) WITH PROPOFOL;  Surgeon: [name], MD;  [facility];  Service: Endoscopy;  Laterality: N/A;   FISTULA SUPERFICIALIZATION Right 02/07/2016   Procedure: RIIGHT UPPER ARM FISTULA SUPERFICIALIZATION;  Surgeon: Elam Dutch, MD;  Location: [facility];  Service: Vascular;  Laterality: Right;   IJ catheter insertion     INSERTION OF DIALYSIS CATHETER Right 02/25/2015   Procedure: INSERTION OF RIGHT INTERNAL JUGULAR DIALYSIS CATHETER;  Surgeon: Serafina Mitchell, MD;  Location: [facility];  Service: Vascular;  Laterality: Right;   KIDNEY TRANSPLANT     LAPAROSCOPIC REPOSITIONING CAPD CATHETER N/A 06/16/2014   Procedure: LAPAROSCOPIC REPOSITIONING CAPD CATHETER;  Surgeon: Ralene Ok, MD;  Location: [facility];  Service: General;  Laterality: N/A;   LAPAROSCOPY N/A 05/04/2015   Procedure: LAPAROSCOPY DIAGNOSTIC LYSIS OF ADHESIONS;  Surgeon: Coralie Keens, MD;  Location: [place] [facility];  Service: General;  Laterality: N/A;   MINOR REMOVAL OF PERITONEAL DIALYSIS CATHETER N/A 04/28/2015   Procedure:  REMOVAL OF PERITONEAL DIALYSIS CATHETER;  Surgeon: Coralie Keens, MD;  Location: [facility];  Service: General;  Laterality: N/A;   NEPHRECTOMY Left 1974   RENAL BIOPSY Right 2012   REVISON OF ARTERIOVENOUS FISTULA Right 05/26/2015   Procedure: SUPERFICIALIZATION OF ARTERIOVENOUS FISTULA WITH SIDE BRANCH LIGATIONS;  Surgeon: Serafina Mitchell, MD;  [facility];  Service: Vascular;  Laterality: Right;   RIGHT/LEFT HEART CATH AND CORONARY ANGIOGRAPHY N/A 08/22/2021   Procedure: RIGHT/LEFT HEART CATH AND CORONARY ANGIOGRAPHY;  Surgeon: Troy Sine, MD;  Location: [facility];  Service: Cardiovascular;  Laterality: N/A;    SKIN CANCER EXCISION     right shoulder   SVT ABLATION N/A 11/23/2016   Procedure: SVT Ablation;  Surgeon: Will Meredith Leeds, MD;  Location: [facility];  Service: Cardiovascular;  Laterality: N/A;   TEE WITHOUT CARDIOVERSION N/A 10/05/2021   Procedure: TRANSESOPHAGEAL ECHOCARDIOGRAM (TEE);  Surgeon: Gilford Raid  K, MD;  Location: [facility];  Service: Open Heart Surgery;  Laterality: N/A;   UMBILICAL HERNIA REPAIR N/A 05/18/2014   Procedure: HERNIA REPAIR UMBILICAL ADULT;  Surgeon: Ralene Ok, MD;  Location: [facility];  Service: General;  Laterality: N/A;   UMBILICAL HERNIA REPAIR N/A 04/28/2015   Procedure: UMBILICAL HERNIA REPAIR WITH MESH;  Surgeon: Coralie Keens, MD;  Location: [facility];  Service: General;  Laterality: N/A;    Family History  Problem Relation Age of Onset   Colon polyps Father    Colon cancer Neg Hx    Stomach cancer Neg Hx              Social History:  reports that he quit smoking about 8 years ago. His smoking use included cigars. He has never used smokeless tobacco. He reports current alcohol use. He reports that he does not use drugs.  Not on File  MEDICATIONS:                                                                                                                     Scheduled:  acetaminophen  1,000 mg Oral Q6H   Or   acetaminophen (TYLENOL) oral liquid 160 mg/5 mL  1,000 mg Per Tube Q6H   ALPRAZolam  0.25 mg Oral BID   amLODipine  10 mg Oral Daily   aspirin EC  325 mg Oral Daily   Or   aspirin  324 mg Per Tube Daily   bisacodyl  10 mg Oral Daily   Or   bisacodyl  10 mg Rectal Daily   Chlorhexidine Gluconate Cloth  6 each Topical Daily   docusate  100 mg Per Tube BID   DULoxetine  60 mg Oral Daily   enoxaparin (LOVENOX) injection  40 mg Subcutaneous QHS   gabapentin  200 mg Oral Q0600   gabapentin  300 mg Oral QHS   insulin aspart  0-9 Units Subcutaneous TID WC   insulin detemir  30 Units Subcutaneous BID   levothyroxine  100 mcg Oral  Daily   mouth rinse  15 mL Mouth Rinse BID   mycophenolate  1,000 mg Oral Q12H   pantoprazole (PROTONIX) IV  40 mg Intravenous QHS   polyethylene glycol  17 g Per Tube Daily   predniSONE  5 mg Oral Q breakfast   rosuvastatin  20 mg Oral Daily   sodium chloride flush  10-40 mL Intracatheter Q12H   sodium chloride flush  3 mL Intravenous Q12H   tamsulosin  0.8 mg Oral QHS   Continuous:  sodium chloride Stopped (10/07/21 0043)   sodium chloride     sodium chloride     amiodarone 30 mg/hr (10/08/21 0200)   epinephrine Stopped (10/06/21 1858)   lactated ringers     lactated ringers 20 mL/hr at 10/08/21 0200   magnesium sulfate     nitroGLYCERIN Stopped (10/05/21 1510)   norepinephrine (LEVOPHED) Adult infusion Stopped (10/06/21 2117)   phenylephrine (NEO-SYNEPHRINE) Adult infusion Stopped (10/05/21 1511)  ROS:                                                                                                                                       The patient is experiencing dyspnea with exertion and has post-surgery incisional pain. Due to fatigue, he is unable to provide a comprehensive ROS.    Blood pressure (!) 150/69, pulse 81, temperature 98.2 F (36.8 C), resp. rate (!) 30, height 5\' 11"  (1.803 m), weight (!) 142.7 kg, SpO2 97 %.   General Examination:                                                                                                       Physical Exam  HEENT-  [place]/AT.   Cardiovascular- Dressing to chest incision appears C/D/I  Lungs- Tachypneic with wheezing provoked by minimal exertion Extremities- RUE edema. BLE edema, worse on the right  Neurological Examination Mental Status: Drowsy. Awakens to an alert state but frequently falls back asleep. Speech is fluent with intact naming and comprehension. Oriented x 5. Good insight.  Cranial Nerves: II: Temporal visual fields intact with no extinction to DSS. PERRL  III,IV, VI: EOMI. No nystagmus. No ptosis. V:  Temp sensation equal bilaterally VII: Subtly decreased NL fold on the right.  VIII: Hearing intact to voice IX,X: No hoarseness or hypophonia XI: Head is midline XII: Midline tongue extension Motor: LUE 4+/5 proximally and distally RUE 4-/5 proximally and distally [name] 0/5 HF, 1/5 hip extension, 1/5 KE and knee flexion, 0/5 ADF and APF LLE 5/5 proximally and distally  Sensory: Temp intact proximally in all 4 limbs. FT intact proximally and distally in all 4 limbs, but feels dull to patient when touching feet and hands. Absent proprioception to toes bilaterally.  Deep Tendon Reflexes:  1+ left brachioradialis, 0 right brachioradialis (edema on the right) 2+ bilateral patellae. 1+ bilateral achilles.  Plantars: Mute bilaterally Cerebellar: No ataxia with FNF bilaterally Gait: Unable to assess   Lab Results: Basic Metabolic Panel: Recent Labs  Lab 10/05/21 0538 10/05/21 0550 10/05/21 0756 10/05/21 1307 10/05/21 1500 10/05/21 2107 10/06/21 0330 10/06/21 0345 10/06/21 1053 10/06/21 1537 10/07/21 0339  NA  --  131*   < > 137   < > 136 137 140 141 135 134*  K  --  3.7   < > 4.9   < > 3.9 3.8 3.5 3.1* 4.1 5.1  CL  --  100   < > 106  --  104  107  --   --  102 102  CO2  --  23  --   --   --  21* 22  --   --  21* 21*  GLUCOSE  --  159*   < > 154*  --  173* 119*  --   --  126* 107*  BUN  --  41*   < > 41*  --  44* 46*  --   --  43* 43*  CREATININE  --  2.10*   < > 2.20*  --  2.61* 2.77*  --   --  2.99* 2.91*  CALCIUM  --  8.7*  --   --   --  7.5* 7.7*  --   --  7.5* 7.7*  MG 2.5*  --   --   --   --  2.3 2.3  --   --  2.1  --    < > = values in this interval not displayed.    CBC: Recent Labs  Lab 10/05/21 1455 10/05/21 1500 10/05/21 2107 10/06/21 0330 10/06/21 0345 10/06/21 1053 10/06/21 1537 10/07/21 0339  WBC 22.3*  --  20.4* 16.9*  --   --  17.8* 14.2*  HGB 13.2   < > 12.0* 12.0* 11.2* 9.9* 11.8* 12.0*  HCT 41.1   < > 36.8* 36.5* 33.0* 29.0* 36.1* 37.3*  MCV 91.3  --   90.0 89.7  --   --  89.8 91.4  PLT 223  --  228 213  --   --  185 164   < > = values in this interval not displayed.    Cardiac Enzymes: No results for input(s): CKTOTAL, CKMB, CKMBINDEX, TROPONINI in the last 168 hours.  Lipid Panel: No results for input(s): CHOL, TRIG, HDL, CHOLHDL, VLDL, LDLCALC in the last 168 hours.  Imaging: CT HEAD WO CONTRAST (5MM)  Result Date: 10/07/2021 CLINICAL DATA:  Neuro deficit, acute, stroke suspected. Right leg weakness. EXAM: CT HEAD WITHOUT CONTRAST TECHNIQUE: Contiguous axial images were obtained from the base of the skull through the vertex without intravenous contrast. COMPARISON:  None. FINDINGS: Brain: No acute infarct, hemorrhage, or mass lesion is present. Mild scattered subcortical white matter hypoattenuation is present bilaterally. Basal ganglia are intact. Insular ribbon is normal. No acute or focal cortical abnormalities are present. Insert normal brainstem Vascular: Dense calcifications are present within the cavernous internal carotid arteries and at the left greater than right vertebral artery at the dural margin. No hyperdense vessels are associated. Skull: Calvarium is intact. No focal lytic or blastic lesions are present. No significant extracranial soft tissue lesion is present. Sinuses/Orbits: Fluid level is present in the left frontal sinus. Mild mucosal thickening is present within anterior ethmoid air cells in the inferior right frontal sinus. No additional fluid levels are present. Minimal mucosal thickening is present in the right sphenoid sinus. The paranasal sinuses and mastoid air cells are otherwise clear. The globes and orbits are within normal limits. IMPRESSION: 1. No acute intracranial abnormality. 2. Mild scattered subcortical white matter hypoattenuation bilaterally likely reflects the sequela of chronic microvascular ischemia. 3. Mild anterior sinus disease. These results were called by telephone at the time of interpretation on  10/07/2021 at 12:11 pm to provider [facility] , who verbally acknowledged these results. Electronically Signed   By: San Morelle M.D.   On: 10/07/2021 12:19   DG CHEST PORT 1 VIEW  Result Date: 10/07/2021 CLINICAL DATA:  Evaluate for pneumothorax. EXAM: PORTABLE CHEST 1  VIEW COMPARISON:  10/06/2021 FINDINGS: Interval removal of left chest tube. No appreciable pneumothorax identified. Previous median sternotomy, aortic valve repair and CABG procedure. Stable cardiomediastinal contours. Small left pleural effusion and pulmonary vascular congestion is unchanged. Mild atelectasis noted in the left base. IMPRESSION: 1. No pneumothorax after removal of left chest tube. 2. Stable small left pleural effusion and pulmonary vascular congestion. 3. Left lung base atelectasis. Electronically Signed   By: Kerby Moors M.D.   On: 10/07/2021 09:52   DG Chest Port 1 View  Result Date: 10/06/2021 CLINICAL DATA:  Recent AVR and single-vessel CABG. EXAM: PORTABLE CHEST 1 VIEW COMPARISON:  Portable chest yesterday at 10:13 p.m. FINDINGS: 3:24 a.m., 10/06/2021. The swan Ganz catheter has been pulled back into the pulmonary outflow tract. Left chest tube and mediastinal drain positioning are stable. No pneumothorax is seen. NGT enters the stomach but the proximal side hole is just below the hiatus and could be advanced further in 10 cm for better positioning. ETT tube is 3.4 cm from the carina. Sternotomy sutures and aortic valve prosthesis are again shown. The heart is enlarged. Mild central vascular prominence continues to be seen, mild interstitial edema in the bases. There are small pleural effusions, with basilar interstitial haziness which could be atelectasis, pneumonitis or ground-glass edema. The mid and upper lungs show no focal opacities. Overall aeration is unchanged. IMPRESSION: The only noteworthy change is that the Swan-Ganz catheter has been pulled back into the pulmonary outflow tract. There  is no interval change in the lung fields as described above or in other support device placements. Electronically Signed   By: Telford Nab M.D.   On: 10/06/2021 04:55   DG CHEST PORT 1 VIEW  Result Date: 10/05/2021 CLINICAL DATA:  Endotracheal tube placement EXAM: PORTABLE CHEST 1 VIEW COMPARISON:  10/05/2021 FINDINGS: Postoperative changes in the mediastinum. Endotracheal tube is present at the thoracic inlet with tip measuring 8.8 cm above the carina. Enteric tube tip is off the field of view but below the left hemidiaphragm. Left Swan-Ganz catheter with tip over the right pulmonary artery. Mediastinal drains. Left chest tube. Cardiac enlargement with pulmonary vascular congestion. Mild perihilar edema appears to be improving since previous study. Small bilateral pleural effusions. No pneumothorax. IMPRESSION: Endotracheal tube tip is at the thoracic inlet, measuring 8.8 cm above the carina. Cardiac enlargement with pulmonary vascular congestion. Improving perihilar edema. Small pleural effusions. Electronically Signed   By: Lucienne Capers M.D.   On: 10/05/2021 22:24   DG Abd Portable 1V  Result Date: 10/06/2021 CLINICAL DATA:  Check enteric tube positioning. EXAM: PORTABLE ABDOMEN - 1 VIEW COMPARISON:  None. FINDINGS: Limited abdomen film, 3:21 a.m. on 10/06/2021 is compared to the contemporaneous chest film. This film only includes the left upper abdomen and left mid to lower lung field. NGT is in place with the proximal side-hole just below the hiatus and could be advanced further in 10 cm for better placement. Intrathoracic support devices and tubes are as described on the prior chest film report. No dilated bowel or gastric dilatation is seen in the visualized portion of the left upper abdomen. There is no supine evidence of free air. No interval change in the visualized lung fields. IMPRESSION: Enteric tube positioning as described above. Electronically Signed   By: Telford Nab M.D.   On:  10/06/2021 05:48     Assessment: 60 year old male with worsened [name] weakness relative to baseline weakness, noted after he regained consciousness following cardiac surgery on  12/29 1. Exam reveals subtle flattening of right NL fold and [name] worse than RUE weakness.  2. Exam findings suggest a new stroke involving the left internal capsule, basal ganglia, thalamus or pons.  3. CT head: No acute intracranial abnormality. Mild scattered subcortical white matter hypoattenuation bilaterally likely reflects the sequela of chronic microvascular ischemia.  Recommendations: 1. MRI and MRA brain after pacer leads are removed.  2. MRI lumbar spine 3. Carotid ultrasound.  4. Cardiac structural imaging follow ups per Cardiology 5. On ASA 6. BP management. Out of the permissive HTN time window.  7. Continue PT and OT.  8. Speech consult 9. CT of abdomen and pelvis to rule out psoas hemorrhage or other mass lesion that could potentially compress the lumbosacral plexus.    Electronically signed: Dr. Kerney Elbe 10/07/2021, 8:46 PM

## 2021-10-07 NOTE — Progress Notes (Addendum)
° °   °  [address]       [place] [address]             [phone]      Feels poorly this PM after going into AF with RVR  BP 100/74    Pulse 93    Temp 99.1 F (37.3 C)    Resp (!) 22    Ht 5\' 11"  (1.803 m)    Wt (!) 142.7 kg    SpO2 92%    BMI 43.88 kg/m  Currently A fib rate 100-130   Intake/Output Summary (Last 24 hours) at 10/07/2021 1806 Last data filed at 10/07/2021 1500 Gross per 24 hour  Intake 1115.93 ml  Output 805 ml  Net 310.93 ml   CBG well controlled  Amiodarone protocol for A fib  Iya Hamed C. Roxan Hockey, MD [facility] [phone]  Addendum CT HEAD WITHOUT CONTRAST   TECHNIQUE: Contiguous axial images were obtained from the base of the skull through the vertex without intravenous contrast.   COMPARISON:  None.   FINDINGS: Brain: No acute infarct, hemorrhage, or mass lesion is present. Mild scattered subcortical white matter hypoattenuation is present bilaterally. Basal ganglia are intact. Insular ribbon is normal. No acute or focal cortical abnormalities are present. Insert normal brainstem   Vascular: Dense calcifications are present within the cavernous internal carotid arteries and at the left greater than right vertebral artery at the dural margin. No hyperdense vessels are associated.   Skull: Calvarium is intact. No focal lytic or blastic lesions are present. No significant extracranial soft tissue lesion is present.   Sinuses/Orbits: Fluid level is present in the left frontal sinus. Mild mucosal thickening is present within anterior ethmoid air cells in the inferior right frontal sinus. No additional fluid levels are present. Minimal mucosal thickening is present in the right sphenoid sinus. The paranasal sinuses and mastoid air cells are otherwise clear. The globes and orbits are within normal limits.   IMPRESSION: 1. No acute intracranial abnormality. 2. Mild scattered subcortical  white matter hypoattenuation bilaterally likely reflects the sequela of chronic microvascular ischemia. 3. Mild anterior sinus disease.   These results were called by telephone at the time of interpretation on 10/07/2021 at 12:11 pm to provider [facility] , who verbally acknowledged these results.     Electronically Signed   By: San Morelle M.D.   On: 10/07/2021 12:19  Neuro was called earlier but no note yet. However, given head CT findings no change in management currently  Remo Lipps C. Roxan Hockey, MD [facility] [phone]

## 2021-10-07 NOTE — Progress Notes (Signed)
RT placed patient on BIPAP at this time. Pt tolerating settings well. RN is aware. RT will monitor as needed.

## 2021-10-07 NOTE — Evaluation (Addendum)
Physical Therapy Evaluation Patient Details Name: Robert [name] MRN: [id number] DOB: December 11, 1960 Today's Date: 10/07/2021  History of Present Illness  60 yo male s/p CABG 12/29 extubated 12/30  PMH CAD obese DM2 ESRD s/p renal tx kidney transplant in 2018 now with CKD3 HTN  Clinical Impression  Pt presents to PT with decr mobility due to rt sided weakness, poor balance, and decr sensation. Pt reports some history of rt leg weakness but not like this and he was ambulatory without any assistive device and driving. Currently unable to even attempt mobility OOB other than with mechanical lift due to above factors as well as pt's morbid obesity. Expect progress will be slow. Also feel pt will have difficulty with sternal precautions since wife reports prior to surgery pt was heavily reliant on UE's for any sit to stand transfers. Notified MD's of significant rt sided weakness and poor balance.        Recommendations for follow up therapy are one component of a multi-disciplinary discharge planning process, led by the attending physician.  Recommendations may be updated based on patient status, additional functional criteria and insurance authorization.  Follow Up Recommendations Acute inpatient rehab (3hours/day)    Assistance Recommended at Discharge Frequent or constant Supervision/Assistance  Functional Status Assessment Patient has had a recent decline in their functional status and demonstrates the ability to make significant improvements in function in a reasonable and predictable amount of time.  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 pt static sitting with strong R lean. pt unable to static sit without max (A). pt return to supine and maxisky lifted to chair total (A)    Transfers                   General transfer comment: maxisky required    Ambulation/Gait                  Stairs            Wheelchair Mobility    Modified Rankin (Stroke Patients Only)       Balance Overall balance assessment: Needs assistance Sitting-balance support: Bilateral upper extremity supported;Feet supported Sitting balance-Leahy Scale: Zero Sitting balance - Comments: dependent on therapist support Postural control: Right lateral lean                                   Pertinent Vitals/Pain Pain Assessment: Faces Faces Pain Scale: Hurts even more Pain Location: chest with movement Pain Descriptors / Indicators: Discomfort;Operative site guarding Pain Intervention(s): Monitored during session;Repositioned;Limited activity within patient's tolerance    Home Living Family/patient expects to be discharged to:: Private residence Living Arrangements: Spouse/significant other Available Help at Discharge: Family Type of Home: House Home Access: Stairs to enter Entrance Stairs-Rails: None Entrance Stairs-Number of Steps: 3 Alternate Level Stairs-Number of Steps: 2 flights of steps  Home Layout: Two level;Able to live on main level with bedroom/bathroom Home Equipment: Shower seat - built in;Hand held shower head;Grab bars - tub/shower (suction grab bars in bathroom) Additional Comments: animals- 12lb yogi the yorkie    Prior Function Prior Level of Function : Independent/Modified  Independent;Working/employed;Driving             Mobility Comments: decreased sensation in feet at baseline. wife reports pt walking out of his shoe and no awareness to it coming off. Wife also reports heavy use of UE's for sit to stand and to get out of car. ADLs Comments: indep per pt report     Hand Dominance   Dominant Hand: Left    Extremity/Trunk Assessment   Upper Extremity Assessment Upper Extremity Assessment: Defer to OT evaluation    Lower Extremity Assessment Lower Extremity Assessment: [name] deficits/detail [name] Deficits / Details: Pt with trace knee extension. Toe wiggle x 1. No ankle or hip activation noted. [name] Sensation: history of peripheral neuropathy;decreased light touch;decreased proprioception    Cervical / Trunk Assessment Cervical / Trunk Assessment: Other exceptions (rounded shoulders, obesity)  Communication   Communication: No difficulties  Cognition Arousal/Alertness: Awake/alert Behavior During Therapy: WFL for tasks assessed/performed;Anxious Overall Cognitive Status: Within Functional Limits for tasks assessed                                         General Comments General comments (skin integrity, edema, etc.): VSS    Exercises    Assessment/Plan    PT Assessment Patient needs continued PT services  PT Problem List Decreased strength;Decreased activity tolerance;Decreased balance;Decreased mobility;Impaired sensation;Obesity;Pain       PT Treatment Interventions DME instruction;Functional mobility training;Therapeutic activities;Therapeutic exercise;Balance training;Patient/family education;Wheelchair mobility training    PT Goals (Current goals can be found in the Care Plan section)  Acute Rehab PT Goals PT Goal Formulation: With patient/family Time For Goal Achievement: 10/21/21 Potential to Achieve Goals: Fair    Frequency Min 3X/week   Barriers to discharge Inaccessible home environment stairs to enter     Co-evaluation PT/OT/SLP Co-Evaluation/Treatment: Yes Reason for Co-Treatment: Complexity of the patient's impairments (multi-system involvement);For patient/therapist safety PT goals addressed during session: Mobility/safety with mobility;Balance         AM-PAC PT "6 Clicks" Mobility  Outcome Measure Help needed turning from your back to your side while in a flat bed without using bedrails?: Total Help needed moving from lying on your back to sitting on the side of a flat bed without using bedrails?: Total Help needed moving to and from a bed to a chair (including a wheelchair)?: Total Help needed standing up from a chair using your arms (e.g., wheelchair or bedside chair)?: Total Help needed to walk in hospital room?: Total Help needed climbing 3-5 steps with a railing? : Total 6 Click Score: 6    End of Session   Activity Tolerance: Patient tolerated treatment well Patient left: in chair;with call bell/phone within reach;with family/visitor present Nurse Communication: Mobility status;Need for lift equipment (Nurse present for transfer) PT Visit Diagnosis: Other abnormalities of gait and mobility (R26.89);Muscle weakness (generalized) (M62.81);Other symptoms and signs involving the nervous system (R29.898)    Time: 3151-7616 PT Time Calculation (min) (ACUTE ONLY): 29 min   Charges:   PT Evaluation $PT Eval High Complexity: 1 High          Avelina Mcclurkin PT Acute Rehabilitation Services Pager  [phone] Office [place] 10/07/2021, 12:50 PM

## 2021-10-07 NOTE — Progress Notes (Signed)
RT changed BIPAP settings to patient's home settings of 18 IPAP over 3 EPAP per patient request. Patient is tolerating settings well at this time. RT will monitor as needed.

## 2021-10-07 NOTE — Plan of Care (Signed)

## 2021-10-07 NOTE — Progress Notes (Signed)
Nephrology Follow-Up Consult note   Assessment/Recommendations: Robert [name] is a/an 60 y.o. male with a past medical history significant for CAD, obesity, DM2, ESRD s/p renal tx in 2018 now w/ CKD3b (BL Crt 1.9-2.3), pHTN who presents for AVR and LAD bypass on 12/29 c/b AKI   Non-Oliguric AKI on CKD 3B: Likely secondary to hemodynamic insult secondary to surgery.  Possible mild ATN.  Cholesterol emboli is possible but given minimal change in creatinine this is less likely a major concern.  Urine output remains fairly good and hemodynamics are reassuring. -Crt seems to have peaked and hopefully will trend down -He definitely has excess volume; defer diuretics to primary team but could be used if needed; particularly if becomes hypoxic -Continue supportive care -Continue to monitor daily Cr, Dose meds for GFR -Monitor Daily I/Os, Daily weight  -Maintain MAP>65 for optimal renal perfusion.  -Avoid nephrotoxic medications including NSAIDs -UA w/ blood likely 2/2 foley, C3 pending   History of kidney transplant/CKD 3B: 2018 at [facility].  Follows with Dr. Lissa Merlin.  No history of rejection.  Recent creatinine has been around 1.9-2.3 with fluctuations.  Continue home CellCept and prednisone.  Received belatacept on 10/04/2021.   Volume Status: Appears volume overloaded.  Can use diuretics as needed.  Hopefully will improve as renal function recovers   Hypertension/hypotension: Holding home blood pressure medications.  MAP fairly good.  Wean pressors as tolerated.  Management per primary team   Anemia: Likely multifactorial with some blood loss related to surgery.  Improved to 12 today.   DM2: Management per primary team.    S/p AVR and LAD bypass: seems to be recovering well. Mgmt per surgery   Hypokalemia: improved today. 5.1 Consider lasix if K rises   Recommendations conveyed to primary service.    [facility] 10/07/2021 8:52  AM  ___________________________________________________________  CC: AKI, renal transplant  Interval History/Subjective: Patient had some trouble breathing last night.  Felt to be largely related to anxiety.  Patient otherwise feels well today.  Good urine output over the past 24 hours and creatinine slightly lower this morning than it was yesterday evening.   Medications:  Current Facility-Administered Medications  Medication Dose Route Frequency Provider Last Rate Last Admin   0.45 % sodium chloride infusion   Intravenous Continuous PRN John Giovanni, PA-C   Stopped at 10/07/21 0043   0.9 %  sodium chloride infusion  250 mL Intravenous Continuous Gold, Wayne E, PA-C       0.9 %  sodium chloride infusion   Intravenous Continuous Gold, Wayne E, PA-C       acetaminophen (TYLENOL) tablet 1,000 mg  1,000 mg Oral Q6H Gold, Wayne E, PA-C   1,000 mg at 10/07/21 1610   Or   acetaminophen (TYLENOL) 160 MG/5ML solution 1,000 mg  1,000 mg Per Tube Q6H Gold, Wayne E, PA-C   1,000 mg at 10/06/21 0510   amLODipine (NORVASC) tablet 10 mg  10 mg Oral Daily Melrose Nakayama, MD       aspirin EC tablet 325 mg  325 mg Oral Daily Gold, Wayne E, PA-C   325 mg at 10/06/21 1010   Or   aspirin chewable tablet 324 mg  324 mg Per Tube Daily Gold, Wayne E, PA-C       bisacodyl (DULCOLAX) EC tablet 10 mg  10 mg Oral Daily Gold, Wayne E, PA-C   10 mg at 10/06/21 1010   Or   bisacodyl (DULCOLAX) suppository  10 mg  10 mg Rectal Daily Gold, Wayne E, PA-C       Chlorhexidine Gluconate Cloth 2 % PADS 6 each  6 each Topical Daily [name], MD   6 each at 10/05/21 2202   dextrose 50 % solution 0-50 mL  0-50 mL Intravenous PRN Gold, Wayne E, PA-C       docusate (COLACE) 50 MG/5ML liquid 100 mg  100 mg Per Tube BID Cristal Generous, NP   100 mg at 10/06/21 2122   DULoxetine (CYMBALTA) DR capsule 60 mg  60 mg Oral Daily Gold, Wayne E, PA-C   60 mg at 10/06/21 1010   enoxaparin (LOVENOX) injection 40 mg  40 mg  Subcutaneous QHS Lars Pinks M, PA-C   40 mg at 10/06/21 2125   EPINEPHrine (ADRENALIN) 5 mg in NS 250 mL (0.02 mg/mL) premix infusion  0.5-10 mcg/min Intravenous Titrated [name], MD   Stopped at 10/06/21 1858   gabapentin (NEURONTIN) capsule 200 mg  200 mg Oral Q0600 Candee Furbish, MD   200 mg at 10/07/21 0535   gabapentin (NEURONTIN) capsule 300 mg  300 mg Oral QHS Candee Furbish, MD   300 mg at 10/06/21 2120   insulin aspart (novoLOG) injection 0-9 Units  0-9 Units Subcutaneous TID WC Melrose Nakayama, MD       insulin detemir (LEVEMIR) injection 30 Units  30 Units Subcutaneous BID [name], MD   30 Units at 10/06/21 2135   lactated ringers infusion   Intravenous Continuous Gold, Wayne E, PA-C       lactated ringers infusion   Intravenous Continuous Gold, Wayne E, PA-C 20 mL/hr at 10/07/21 0700 Infusion Verify at 10/07/21 0700   levothyroxine (SYNTHROID) tablet 100 mcg  100 mcg Oral Daily Melrose Nakayama, MD       magnesium sulfate IVPB 4 g 100 mL  4 g Intravenous Once Gold, Wayne E, PA-C       MEDLINE mouth rinse  15 mL Mouth Rinse BID [name], MD   15 mL at 10/06/21 2138   metoprolol tartrate (LOPRESSOR) injection 2.5-5 mg  2.5-5 mg Intravenous Q2H PRN Jadene Pierini E, PA-C   5 mg at 10/07/21 0641   morphine 2 MG/ML injection 1-4 mg  1-4 mg Intravenous Q1H PRN Jadene Pierini E, PA-C   2 mg at 10/06/21 0211   mycophenolate (CELLCEPT) capsule 1,000 mg  1,000 mg Oral Q12H [name], MD   1,000 mg at 10/06/21 2119   nitroGLYCERIN 50 mg in dextrose 5 % 250 mL (0.2 mg/mL) infusion  0-100 mcg/min Intravenous Titrated John Giovanni, PA-C   Held at 10/05/21 1510   norepinephrine (LEVOPHED) 4mg  in 248mL (0.016 mg/mL) premix infusion  0-40 mcg/min Intravenous Titrated Einar Grad, [facility]   Stopped at 10/06/21 2117   ondansetron (ZOFRAN) injection 4 mg  4 mg Intravenous Q6H PRN Jadene Pierini E, PA-C       oxyCODONE (Oxy IR/ROXICODONE) immediate release  tablet 5-10 mg  5-10 mg Oral Q3H PRN Jadene Pierini E, PA-C   5 mg at 10/07/21 0137   pantoprazole (PROTONIX) injection 40 mg  40 mg Intravenous QHS Cristal Generous, NP   40 mg at 10/06/21 2131   phenylephrine (NEO-SYNEPHRINE) 20mg /NS 232mL premix infusion  0-400 mcg/min Intravenous Titrated Jadene Pierini E, PA-C   Held at 10/05/21 1511   polyethylene glycol (MIRALAX / GLYCOLAX) packet 17 g  17 g Per Tube Daily  Cristal Generous, NP   17 g at 10/05/21 1708   predniSONE (DELTASONE) tablet 5 mg  5 mg Oral Q breakfast Gold, Wayne E, PA-C   5 mg at 10/06/21 1010   rosuvastatin (CRESTOR) tablet 20 mg  20 mg Oral Daily Gold, Wayne E, PA-C   20 mg at 10/06/21 1010   sodium chloride flush (NS) 0.9 % injection 10-40 mL  10-40 mL Intracatheter Q12H [name], MD   10 mL at 10/06/21 2136   sodium chloride flush (NS) 0.9 % injection 10-40 mL  10-40 mL Intracatheter PRN [name], MD       sodium chloride flush (NS) 0.9 % injection 3 mL  3 mL Intravenous Q12H Gold, Wayne E, PA-C   3 mL at 10/06/21 2136   sodium chloride flush (NS) 0.9 % injection 3 mL  3 mL Intravenous PRN Gold, Wayne E, PA-C       tamsulosin (FLOMAX) capsule 0.8 mg  0.8 mg Oral QHS Melrose Nakayama, MD       traMADol Veatrice Bourbon) tablet 50 mg  50 mg Oral Q6H PRN [name], MD       traZODone (DESYREL) tablet 50 mg  50 mg Oral QHS PRN Melrose Nakayama, MD       zolpidem (AMBIEN) tablet 10 mg  10 mg Oral QHS PRN Melrose Nakayama, MD          Review of Systems: 10 systems reviewed and negative except per interval history/subjective  Physical Exam: Vitals:   10/07/21 0645 10/07/21 0700  BP:  (!) 152/74  Pulse:  81  Resp:  (!) 23  Temp: 97.9 F (36.6 C)   SpO2:  98%   No intake/output data recorded.  Intake/Output Summary (Last 24 hours) at 10/07/2021 0254 Last data filed at 10/07/2021 0700 Gross per 24 hour  Intake 1851.57 ml  Output 1585 ml  Net 266.57 ml   Constitutional: Obese, well-appearing, no acute  distress ENMT: ears and nose without scars or lesions, MMM CV: normal rate, 1+ pitting edema in all 4 extremities Respiratory: Bilateral chest rise, normal work of breathing Gastrointestinal: soft, non-tender, no palpable masses or hernias Skin: no visible lesions or rashes Psych: alert, judgement/insight appropriate, appropriate mood and affect   Test Results I personally reviewed new and old clinical labs and radiology tests Lab Results  Component Value Date   NA 134 (L) 10/07/2021   K 5.1 10/07/2021   CL 102 10/07/2021   CO2 21 (L) 10/07/2021   BUN 43 (H) 10/07/2021   CREATININE 2.91 (H) 10/07/2021   GFR 36.74 (L) 07/04/2021   GLU 203 07/07/2019   CALCIUM 7.7 (L) 10/07/2021   ALBUMIN 3.6 10/03/2021   PHOS 4.4 05/13/2015

## 2021-10-07 NOTE — Progress Notes (Addendum)
2 Days Post-Op Procedure(s) (LRB): CORONARY ARTERY BYPASS GRAFTING (CABG) X 1, ON PUMP, USING LEFT INTERNAL MAMMARY ARTERY CONDUIT (N/A) AORTIC VALVE REPLACEMENT (AVR) WITH INSPIRIS RESILIA AORTIC VALVE SIZE 27MM (N/A) TRANSESOPHAGEAL ECHOCARDIOGRAM (TEE) (N/A) APPLICATION OF CELL SAVER Subjective: Anxious, c/o right leg pain and weakness  Objective: Vital signs in last 24 hours: Temp:  [97.9 F (36.6 C)-100 F (37.8 C)] 97.9 F (36.6 C) (12/31 0645) Pulse Rate:  [81-104] 81 (12/31 0700) Cardiac Rhythm: Normal sinus rhythm (12/31 0600) Resp:  [14-37] 23 (12/31 0700) BP: (89-181)/(46-81) 152/74 (12/31 0700) SpO2:  [86 %-99 %] 98 % (12/31 0700) Arterial Line BP: (88-185)/(38-92) 149/62 (12/31 0500) FiO2 (%):  [40 %] 40 % (12/31 0200) Weight:  [142.7 kg] 142.7 kg (12/31 0500)  Hemodynamic parameters for last 24 hours: PAP: (33-59)/(15-30) 54/26 CO:  [8 L/min] 8 L/min CI:  [3.2 L/min/m2] 3.2 L/min/m2  Intake/Output from previous day: 12/30 0701 - 12/31 0700 In: 1980 [I.V.:1779.8; IV Piggyback:200.1] Out: 7482 [Urine:1495; Emesis/NG output:10; Chest Tube:170] Intake/Output this shift: No intake/output data recorded.  General appearance: alert, mild distress, and anxious Neurologic: see below Heart: regular rate and rhythm Lungs: diminished breath sounds bibasilar Abdomen: normal findings: soft, non-tender NEURO- A and O x 3, decreased sensation both feet, weakness right leg and also limited by pain   Lab Results: Recent Labs    10/06/21 1537 10/07/21 0339  WBC 17.8* 14.2*  HGB 11.8* 12.0*  HCT 36.1* 37.3*  PLT 185 164   BMET:  Recent Labs    10/06/21 1537 10/07/21 0339  NA 135 134*  K 4.1 5.1  CL 102 102  CO2 21* 21*  GLUCOSE 126* 107*  BUN 43* 43*  CREATININE 2.99* 2.91*  CALCIUM 7.5* 7.7*    PT/INR:  Recent Labs    10/05/21 1455  LABPROT 17.2*  INR 1.4*   ABG    Component Value Date/Time   PHART 7.407 10/06/2021 1053   HCO3 18.0 (L) 10/06/2021  1053   TCO2 19 (L) 10/06/2021 1053   ACIDBASEDEF 6.0 (H) 10/06/2021 1053   O2SAT 79.6 10/07/2021 0339   CBG (last 3)  Recent Labs    10/06/21 2318 10/07/21 0357 10/07/21 0647  GLUCAP 135* 109* 108*    Assessment/Plan: S/P Procedure(s) (LRB): CORONARY ARTERY BYPASS GRAFTING (CABG) X 1, ON PUMP, USING LEFT INTERNAL MAMMARY ARTERY CONDUIT (N/A) AORTIC VALVE REPLACEMENT (AVR) WITH INSPIRIS RESILIA AORTIC VALVE SIZE 27MM (N/A) TRANSESOPHAGEAL ECHOCARDIOGRAM (TEE) (N/A) APPLICATION OF CELL SAVER POD # 2 NEURO- has right leg weakness that predates surgery, c/o now being worse,  Right foot with 2+ pulses, brisk cap refill PT consult On gabapentin Anxious and requesting medication  CV- in SR, co-ox 80  RESP- continue IS, on Maxipime  RENAL- creatinine 2.91 (2.99 last PM)  Good UO  Will defer diuretics to Nephrology  On Cell-cept and prednisone  ENDO- CBG well controlled   GI- tolerating PO  SCD + enoxaparin    LOS: 2 days    Melrose Nakayama 10/07/2021  Assessed by PT, does have significant weakness Will consult Neuro and obtain head Ct  Remo Lipps C. Roxan Hockey, MD [facility] [phone]

## 2021-10-08 ENCOUNTER — Inpatient Hospital Stay (HOSPITAL_COMMUNITY): Payer: 59

## 2021-10-08 DIAGNOSIS — Z8616 Personal history of COVID-19: Secondary | ICD-10-CM | POA: Insufficient documentation

## 2021-10-08 HISTORY — DX: Personal history of COVID-19: Z86.16

## 2021-10-08 LAB — CULTURE, RESPIRATORY W GRAM STAIN
Culture: NO GROWTH
Gram Stain: NONE SEEN

## 2021-10-08 LAB — RENAL FUNCTION PANEL
Albumin: 2.4 g/dL — ABNORMAL LOW (ref 3.5–5.0)
Anion gap: 8 (ref 5–15)
BUN: 44 mg/dL — ABNORMAL HIGH (ref 8–23)
CO2: 21 mmol/L — ABNORMAL LOW (ref 22–32)
Calcium: 8.2 mg/dL — ABNORMAL LOW (ref 8.9–10.3)
Chloride: 105 mmol/L (ref 98–111)
Creatinine, Ser: 2.45 mg/dL — ABNORMAL HIGH (ref 0.61–1.24)
GFR, Estimated: 29 mL/min — ABNORMAL LOW (ref >60)
Glucose, Bld: 148 mg/dL — ABNORMAL HIGH (ref 70–99)
Phosphorus: 3.3 mg/dL (ref 2.5–4.6)
Potassium: 4.7 mmol/L (ref 3.5–5.1)
Sodium: 134 mmol/L — ABNORMAL LOW (ref 135–145)

## 2021-10-08 LAB — CBC
HCT: 37.4 % — ABNORMAL LOW (ref 39.0–52.0)
Hemoglobin: 11.5 g/dL — ABNORMAL LOW (ref 13.0–17.0)
MCH: 28.7 pg (ref 26.0–34.0)
MCHC: 30.7 g/dL (ref 30.0–36.0)
MCV: 93.3 fL (ref 80.0–100.0)
Platelets: 146 10*3/uL — ABNORMAL LOW (ref 150–400)
RBC: 4.01 MIL/uL — ABNORMAL LOW (ref 4.22–5.81)
RDW: 15.6 % — ABNORMAL HIGH (ref 11.5–15.5)
WBC: 11.1 10*3/uL — ABNORMAL HIGH (ref 4.0–10.5)
nRBC: 0 % (ref 0.0–0.2)

## 2021-10-08 LAB — GLUCOSE, CAPILLARY
Glucose-Capillary: 95 mg/dL (ref 70–99)
Glucose-Capillary: 91 mg/dL (ref 70–99)
Glucose-Capillary: 132 mg/dL — ABNORMAL HIGH (ref 70–99)
Glucose-Capillary: 165 mg/dL — ABNORMAL HIGH (ref 70–99)
Glucose-Capillary: 151 mg/dL — ABNORMAL HIGH (ref 70–99)
Glucose-Capillary: 194 mg/dL — ABNORMAL HIGH (ref 70–99)

## 2021-10-08 LAB — MAGNESIUM: Magnesium: 2.1 mg/dL (ref 1.7–2.4)

## 2021-10-08 LAB — COOXEMETRY PANEL
Carboxyhemoglobin: 1.2 % (ref 0.5–1.5)
Methemoglobin: 0.7 % (ref 0.0–1.5)
O2 Saturation: 74.2 %
Total hemoglobin: 12.1 g/dL (ref 12.0–16.0)

## 2021-10-08 LAB — POTASSIUM: Potassium: 4.5 mmol/L (ref 3.5–5.1)

## 2021-10-08 MED ORDER — METOPROLOL TARTRATE 25 MG PO TABS
25.0000 mg | ORAL_TABLET | Freq: Two times a day (BID) | ORAL | Status: DC
  Administered 2021-10-08 – 2021-10-09 (×3): 25 mg via ORAL
  Filled 2021-10-08 (×3): qty 1

## 2021-10-08 MED ORDER — FUROSEMIDE 10 MG/ML IJ SOLN
40.0000 mg | Freq: Two times a day (BID) | INTRAMUSCULAR | Status: AC
  Administered 2021-10-08 (×2): 40 mg via INTRAVENOUS
  Filled 2021-10-08 (×2): qty 4

## 2021-10-08 NOTE — Progress Notes (Addendum)
Nephrology Follow-Up Consult note   Assessment/Recommendations: Robert [name] is a/an 61 y.o. male with a past medical history significant for CAD, obesity, DM2, ESRD s/p renal tx in 2018 now w/ CKD3b (BL Crt 1.9-2.3), pHTN who presents for AVR and LAD bypass on 12/29 c/b AKI   Non-Oliguric AKI on CKD 3B: Likely secondary to hemodynamic insult secondary to surgery.  Possible mild ATN.  Cholesterol emboli is possible but given minimal change in creatinine this is less likely a major concern.  Urine output remains fairly good and hemodynamics are reassuring. -Creatinine now improving down to 2.5 with baseline of 1.9-2.3. -He definitely has excess volume; defer diuretics to primary team but could be used if needed; particularly if becomes hypoxic -Continue supportive care -Continue to monitor daily Cr, Dose meds for GFR -Monitor Daily I/Os, Daily weight  -Maintain MAP>65 for optimal renal perfusion.  -Avoid nephrotoxic medications including NSAIDs -UA w/ blood likely 2/2 foley  Given his significant improvement in kidney function nearing his baseline we will sign off at this time.  If further assistance is needed from nephrology please do not hesitate to consult [place] again.   History of kidney transplant/CKD 3B: 2018 at [facility].  Follows with Dr. Lissa Merlin.  No history of rejection.  Recent creatinine has been around 1.9-2.3 with fluctuations.  Continue home CellCept and prednisone.  Received belatacept on 10/04/2021.   Volume Status: Appears volume overloaded.  Can use diuretics as needed.  Hopefully will improve as renal function recovers   Hypertension/hypotension: Holding home blood pressure medications.  MAP fairly good. Off pressors. Mgmt per primary   Anemia: Likely multifactorial with some blood loss related to surgery.  I overall improved to 11.5.   DM2: Management per primary team.    S/p AVR and LAD bypass: seems to be recovering well. Mgmt per surgery     Recommendations conveyed to  primary service.    [facility] 10/08/2021 8:57 AM  ___________________________________________________________  CC: AKI, renal transplant  Interval History/Subjective: Patient having some right lower extremity weakness.  CT head okay.  Evaluation per neurology.  Urine output improving up to 2 L and creatinine going down to 2.5.   Medications:  Current Facility-Administered Medications  Medication Dose Route Frequency Provider Last Rate Last Admin   0.45 % sodium chloride infusion   Intravenous Continuous PRN John Giovanni, PA-C   Stopped at 10/07/21 0043   0.9 %  sodium chloride infusion  250 mL Intravenous Continuous Gold, Wayne E, PA-C       0.9 %  sodium chloride infusion   Intravenous Continuous Gold, Wayne E, PA-C       acetaminophen (TYLENOL) tablet 1,000 mg  1,000 mg Oral Q6H Gold, Wayne E, PA-C   1,000 mg at 10/08/21 4287   Or   acetaminophen (TYLENOL) 160 MG/5ML solution 1,000 mg  1,000 mg Per Tube Q6H Gold, Wayne E, PA-C   1,000 mg at 10/06/21 0510   ALPRAZolam Duanne Moron) tablet 0.25 mg  0.25 mg Oral BID Melrose Nakayama, MD   0.25 mg at 10/07/21 2125   amiodarone (NEXTERONE PREMIX) 360-4.14 MG/200ML-% (1.8 mg/mL) IV infusion  30 mg/hr Intravenous Continuous Melrose Nakayama, MD 16.67 mL/hr at 10/08/21 0800 30 mg/hr at 10/08/21 0800   amLODipine (NORVASC) tablet 10 mg  10 mg Oral Daily Melrose Nakayama, MD   10 mg at 10/07/21 6811   aspirin EC tablet 325 mg  325 mg Oral Daily John Giovanni, PA-C  325 mg at 10/07/21 4196   Or   aspirin chewable tablet 324 mg  324 mg Per Tube Daily Gold, Wayne E, PA-C       bisacodyl (DULCOLAX) EC tablet 10 mg  10 mg Oral Daily Gold, Wayne E, PA-C   10 mg at 10/07/21 2229   Or   bisacodyl (DULCOLAX) suppository 10 mg  10 mg Rectal Daily Gold, Wayne E, PA-C       Chlorhexidine Gluconate Cloth 2 % PADS 6 each  6 each Topical Daily [name], MD   6 each at 10/07/21 0953   dextrose 50 % solution  0-50 mL  0-50 mL Intravenous PRN Gold, Wayne E, PA-C       docusate (COLACE) 50 MG/5ML liquid 100 mg  100 mg Per Tube BID Cristal Generous, NP   100 mg at 10/07/21 7989   DULoxetine (CYMBALTA) DR capsule 60 mg  60 mg Oral Daily Gold, Wayne E, PA-C   60 mg at 10/07/21 0953   enoxaparin (LOVENOX) injection 40 mg  40 mg Subcutaneous QHS Lars Pinks M, PA-C   40 mg at 10/07/21 2114   EPINEPHrine (ADRENALIN) 5 mg in NS 250 mL (0.02 mg/mL) premix infusion  0.5-10 mcg/min Intravenous Titrated [name], MD   Stopped at 10/06/21 1858   furosemide (LASIX) injection 40 mg  40 mg Intravenous BID Melrose Nakayama, MD   40 mg at 10/08/21 0857   gabapentin (NEURONTIN) capsule 200 mg  200 mg Oral Q0600 Candee Furbish, MD   200 mg at 10/08/21 0550   gabapentin (NEURONTIN) capsule 300 mg  300 mg Oral QHS Candee Furbish, MD   300 mg at 10/07/21 2125   insulin aspart (novoLOG) injection 0-9 Units  0-9 Units Subcutaneous TID WC Melrose Nakayama, MD   1 Units at 10/07/21 1636   insulin detemir (LEVEMIR) injection 30 Units  30 Units Subcutaneous BID [name], MD   30 Units at 10/07/21 2113   lactated ringers infusion   Intravenous Continuous Gold, Wayne E, PA-C       lactated ringers infusion   Intravenous Continuous Gold, Wayne E, PA-C 20 mL/hr at 10/08/21 0800 Infusion Verify at 10/08/21 0800   levothyroxine (SYNTHROID) tablet 100 mcg  100 mcg Oral Daily Melrose Nakayama, MD   100 mcg at 10/08/21 0551   magnesium sulfate IVPB 4 g 100 mL  4 g Intravenous Once Jadene Pierini E, PA-C       MEDLINE mouth rinse  15 mL Mouth Rinse BID [name], MD   15 mL at 10/07/21 2133   metoprolol tartrate (LOPRESSOR) injection 2.5-5 mg  2.5-5 mg Intravenous Q2H PRN Jadene Pierini E, PA-C   5 mg at 10/08/21 0215   metoprolol tartrate (LOPRESSOR) tablet 25 mg  25 mg Oral BID Melrose Nakayama, MD       morphine 2 MG/ML injection 1-4 mg  1-4 mg Intravenous Q1H PRN Jadene Pierini E, PA-C   2 mg at  10/06/21 0211   mycophenolate (CELLCEPT) capsule 1,000 mg  1,000 mg Oral Q12H [name], MD   1,000 mg at 10/07/21 2116   nitroGLYCERIN 50 mg in dextrose 5 % 250 mL (0.2 mg/mL) infusion  0-100 mcg/min Intravenous Titrated John Giovanni, PA-C   Held at 10/05/21 1510   norepinephrine (LEVOPHED) 4mg  in 220mL (0.016 mg/mL) premix infusion  0-40 mcg/min Intravenous Titrated Einar Grad, [place] at 10/06/21  2117   ondansetron (ZOFRAN) injection 4 mg  4 mg Intravenous Q6H PRN Gold, Wayne E, PA-C       oxyCODONE (Oxy IR/ROXICODONE) immediate release tablet 5-10 mg  5-10 mg Oral Q3H PRN Jadene Pierini E, PA-C   5 mg at 10/07/21 0137   pantoprazole (PROTONIX) injection 40 mg  40 mg Intravenous QHS Cristal Generous, NP   40 mg at 10/07/21 2127   phenylephrine (NEO-SYNEPHRINE) 20mg /NS 26mL premix infusion  0-400 mcg/min Intravenous Titrated John Giovanni, PA-C   Held at 10/05/21 1511   polyethylene glycol (MIRALAX / GLYCOLAX) packet 17 g  17 g Per Tube Daily Cristal Generous, NP   17 g at 10/07/21 5188   predniSONE (DELTASONE) tablet 5 mg  5 mg Oral Q breakfast Gold, Wayne E, PA-C   5 mg at 10/08/21 0802   rosuvastatin (CRESTOR) tablet 20 mg  20 mg Oral Daily Gold, Wayne E, PA-C   20 mg at 10/07/21 4166   sodium chloride flush (NS) 0.9 % injection 10-40 mL  10-40 mL Intracatheter Q12H [name], MD   10 mL at 10/07/21 2132   sodium chloride flush (NS) 0.9 % injection 10-40 mL  10-40 mL Intracatheter PRN [name], MD       sodium chloride flush (NS) 0.9 % injection 3 mL  3 mL Intravenous Q12H Gold, Wayne E, PA-C   3 mL at 10/07/21 2133   sodium chloride flush (NS) 0.9 % injection 3 mL  3 mL Intravenous PRN Gold, Wayne E, PA-C       tamsulosin (FLOMAX) capsule 0.8 mg  0.8 mg Oral QHS Melrose Nakayama, MD   0.8 mg at 10/07/21 2125   traMADol (ULTRAM) tablet 50 mg  50 mg Oral Q6H PRN [name], MD   50 mg at 10/07/21 1425   traZODone (DESYREL) tablet 50 mg  50 mg Oral QHS PRN  Melrose Nakayama, MD   50 mg at 10/07/21 2125   zolpidem (AMBIEN) tablet 10 mg  10 mg Oral QHS PRN Melrose Nakayama, MD   10 mg at 10/07/21 2235      Review of Systems: 10 systems reviewed and negative except per interval history/subjective  Physical Exam: Vitals:   10/08/21 0700 10/08/21 0800  BP: (!) 141/72 138/68  Pulse: 82 88  Resp: (!) 39 (!) 23  Temp: 98.6 F (37 C)   SpO2: 100% 98%   Total I/O In: 290 [P.O.:240; I.V.:50] Out: 35 [Urine:35]  Intake/Output Summary (Last 24 hours) at 10/08/2021 0857 Last data filed at 10/08/2021 0800 Gross per 24 hour  Intake 2130.97 ml  Output 1675 ml  Net 455.97 ml   Constitutional: Obese, well-appearing, no acute distress ENMT: ears and nose without scars or lesions, MMM CV: normal rate, 1+ pitting edema in all 4 extremities Respiratory: Bilateral chest rise, normal work of breathing Gastrointestinal: soft, non-tender, no palpable masses or hernias Skin: no visible lesions or rashes Psych: alert, judgement/insight appropriate, appropriate mood and affect   Test Results I personally reviewed new and old clinical labs and radiology tests Lab Results  Component Value Date   NA 134 (L) 10/08/2021   K 4.7 10/08/2021   CL 105 10/08/2021   CO2 21 (L) 10/08/2021   BUN 44 (H) 10/08/2021   CREATININE 2.45 (H) 10/08/2021   GFR 36.74 (L) 07/04/2021   GLU 203 07/07/2019   CALCIUM 8.2 (L) 10/08/2021   ALBUMIN 2.4 (L) 10/08/2021   PHOS  3.3 10/08/2021  ° ° ° ° ° °

## 2021-10-08 NOTE — Progress Notes (Signed)
° °   °  [address]       [place] [address]             [phone]      Uneventful day, no new issues  BP (!) 176/69 (BP Location: Left Arm)    Pulse 78    Temp 98 F (36.7 C) (Oral)    Resp (!) 24    Ht 5\' 11"  (1.803 m)    Wt (!) 142.7 kg    SpO2 96%    BMI 43.88 kg/m  In SR  Intake/Output Summary (Last 24 hours) at 10/08/2021 1737 Last data filed at 10/08/2021 1700 Gross per 24 hour  Intake 2636.89 ml  Output 4025 ml  Net -1388.11 ml   Diuresing well  Remo Lipps C. Roxan Hockey, MD [facility] [phone]

## 2021-10-08 NOTE — Progress Notes (Signed)
3 Days Post-Op Procedure(s) (LRB): CORONARY ARTERY BYPASS GRAFTING (CABG) X 1, ON PUMP, USING LEFT INTERNAL MAMMARY ARTERY CONDUIT (N/A) AORTIC VALVE REPLACEMENT (AVR) WITH INSPIRIS RESILIA AORTIC VALVE SIZE 27MM (N/A) TRANSESOPHAGEAL ECHOCARDIOGRAM (TEE) (N/A) APPLICATION OF CELL SAVER Subjective: Feels a little better this AM  Objective: Vital signs in last 24 hours: Temp:  [96.6 F (35.9 C)-99.3 F (37.4 C)] 98.6 F (37 C) (01/01 0700) Pulse Rate:  [67-114] 88 (01/01 0800) Cardiac Rhythm: Atrial fibrillation (01/01 0800) Resp:  [18-39] 23 (01/01 0800) BP: (100-191)/(65-109) 138/68 (01/01 0800) SpO2:  [92 %-100 %] 98 % (01/01 0800) FiO2 (%):  [40 %] 40 % (01/01 0000)  Hemodynamic parameters for last 24 hours:    Intake/Output from previous day: 12/31 0701 - 01/01 0700 In: 2041 [P.O.:750; I.V.:691] Out: 1990 [Urine:1990] Intake/Output this shift: Total I/O In: 290 [P.O.:240; I.V.:50] Out: 35 [Urine:35]  General appearance: alert, cooperative, and no distress Neurologic: unchanged Heart: regular rate and rhythm Lungs: diminished breath sounds bibasilar Abdomen: normal findings: soft, non-tender Extremities: edema 2-3+  Lab Results: Recent Labs    10/07/21 0339 10/08/21 0313  WBC 14.2* 11.1*  HGB 12.0* 11.5*  HCT 37.3* 37.4*  PLT 164 146*   BMET:  Recent Labs    10/07/21 0339 10/08/21 0313  NA 134* 134*  K 5.1 4.7  CL 102 105  CO2 21* 21*  GLUCOSE 107* 148*  BUN 43* 44*  CREATININE 2.91* 2.45*  CALCIUM 7.7* 8.2*    PT/INR:  Recent Labs    10/05/21 1455  LABPROT 17.2*  INR 1.4*   ABG    Component Value Date/Time   PHART 7.407 10/06/2021 1053   HCO3 18.0 (L) 10/06/2021 1053   TCO2 19 (L) 10/06/2021 1053   ACIDBASEDEF 6.0 (H) 10/06/2021 1053   O2SAT 74.2 10/08/2021 0313   CBG (last 3)  Recent Labs    10/07/21 2113 10/08/21 0712 10/08/21 0756  GLUCAP 180* 95 91    Assessment/Plan: S/P Procedure(s) (LRB): CORONARY ARTERY BYPASS  GRAFTING (CABG) X 1, ON PUMP, USING LEFT INTERNAL MAMMARY ARTERY CONDUIT (N/A) AORTIC VALVE REPLACEMENT (AVR) WITH INSPIRIS RESILIA AORTIC VALVE SIZE 27MM (N/A) TRANSESOPHAGEAL ECHOCARDIOGRAM (TEE) (N/A) APPLICATION OF CELL SAVER POD # 3  AVR, CABG NEURO- exam unchanged, head Ct negative for stroke  Continue PT/OT CV- in and out of A fib- rate now controlled with IV amiodarone  Start Lopressor PO  Hypertension - continue Norvasc RESP- Still on 4L [place]- continue IS  Diurese RENAL- creatinine down to 2.4  Will begin diuresis with IV Lasix  Cell cept/ Prednisone GI- tolerating PO ENDO- continue levemir + SSI Mobilize as tolerated   LOS: 3 days    Melrose Nakayama 10/08/2021

## 2021-10-09 ENCOUNTER — Inpatient Hospital Stay (HOSPITAL_COMMUNITY): Payer: 59

## 2021-10-09 LAB — POCT I-STAT, CHEM 8
BUN: 46 mg/dL — ABNORMAL HIGH (ref 8–23)
Calcium, Ion: 1.14 mmol/L — ABNORMAL LOW (ref 1.15–1.40)
Chloride: 103 mmol/L (ref 98–111)
Creatinine, Ser: 2.4 mg/dL — ABNORMAL HIGH (ref 0.61–1.24)
Glucose, Bld: 180 mg/dL — ABNORMAL HIGH (ref 70–99)
HCT: 38 % — ABNORMAL LOW (ref 39.0–52.0)
Hemoglobin: 12.9 g/dL — ABNORMAL LOW (ref 13.0–17.0)
Potassium: 4.7 mmol/L (ref 3.5–5.1)
Sodium: 134 mmol/L — ABNORMAL LOW (ref 135–145)
TCO2: 24 mmol/L (ref 22–32)

## 2021-10-09 LAB — RENAL FUNCTION PANEL
Albumin: 1.8 g/dL — ABNORMAL LOW (ref 3.5–5.0)
Anion gap: 6 (ref 5–15)
BUN: 42 mg/dL — ABNORMAL HIGH (ref 8–23)
CO2: 20 mmol/L — ABNORMAL LOW (ref 22–32)
Calcium: 6.9 mg/dL — ABNORMAL LOW (ref 8.9–10.3)
Chloride: 111 mmol/L (ref 98–111)
Creatinine, Ser: 2.47 mg/dL — ABNORMAL HIGH (ref 0.61–1.24)
GFR, Estimated: 29 mL/min — ABNORMAL LOW (ref >60)
Glucose, Bld: 131 mg/dL — ABNORMAL HIGH (ref 70–99)
Phosphorus: 2.9 mg/dL (ref 2.5–4.6)
Potassium: 3.2 mmol/L — ABNORMAL LOW (ref 3.5–5.1)
Sodium: 137 mmol/L (ref 135–145)

## 2021-10-09 LAB — COOXEMETRY PANEL
Carboxyhemoglobin: 1.3 % (ref 0.5–1.5)
Methemoglobin: 0.8 % (ref 0.0–1.5)
O2 Saturation: 74.7 %
Total hemoglobin: 11.6 g/dL — ABNORMAL LOW (ref 12.0–16.0)

## 2021-10-09 LAB — CBC
HCT: 35.4 % — ABNORMAL LOW (ref 39.0–52.0)
Hemoglobin: 11.2 g/dL — ABNORMAL LOW (ref 13.0–17.0)
MCH: 29.1 pg (ref 26.0–34.0)
MCHC: 31.6 g/dL (ref 30.0–36.0)
MCV: 91.9 fL (ref 80.0–100.0)
Platelets: 128 10*3/uL — ABNORMAL LOW (ref 150–400)
RBC: 3.85 MIL/uL — ABNORMAL LOW (ref 4.22–5.81)
RDW: 15.6 % — ABNORMAL HIGH (ref 11.5–15.5)
WBC: 7.7 10*3/uL (ref 4.0–10.5)
nRBC: 0 % (ref 0.0–0.2)

## 2021-10-09 LAB — GLUCOSE, CAPILLARY
Glucose-Capillary: 112 mg/dL — ABNORMAL HIGH (ref 70–99)
Glucose-Capillary: 128 mg/dL — ABNORMAL HIGH (ref 70–99)
Glucose-Capillary: 186 mg/dL — ABNORMAL HIGH (ref 70–99)
Glucose-Capillary: 187 mg/dL — ABNORMAL HIGH (ref 70–99)

## 2021-10-09 LAB — MAGNESIUM: Magnesium: 1.7 mg/dL (ref 1.7–2.4)

## 2021-10-09 MED ORDER — MAGNESIUM OXIDE -MG SUPPLEMENT 400 (240 MG) MG PO TABS
400.0000 mg | ORAL_TABLET | Freq: Once | ORAL | Status: AC
  Administered 2021-10-09: 400 mg via ORAL
  Filled 2021-10-09: qty 1

## 2021-10-09 MED ORDER — METOPROLOL TARTRATE 50 MG PO TABS
50.0000 mg | ORAL_TABLET | Freq: Two times a day (BID) | ORAL | Status: DC
  Administered 2021-10-09 – 2021-10-18 (×18): 50 mg via ORAL
  Filled 2021-10-09 (×18): qty 1

## 2021-10-09 MED ORDER — POTASSIUM CHLORIDE CRYS ER 20 MEQ PO TBCR
40.0000 meq | EXTENDED_RELEASE_TABLET | Freq: Once | ORAL | Status: AC
  Administered 2021-10-09: 40 meq via ORAL
  Filled 2021-10-09: qty 2

## 2021-10-09 MED ORDER — FUROSEMIDE 10 MG/ML IJ SOLN
40.0000 mg | Freq: Every day | INTRAMUSCULAR | Status: DC
  Administered 2021-10-09: 40 mg via INTRAVENOUS
  Filled 2021-10-09: qty 4

## 2021-10-09 MED ORDER — AMIODARONE HCL 200 MG PO TABS
400.0000 mg | ORAL_TABLET | Freq: Two times a day (BID) | ORAL | Status: DC
  Administered 2021-10-09 – 2021-10-14 (×12): 400 mg via ORAL
  Filled 2021-10-09 (×13): qty 2

## 2021-10-09 MED ORDER — LISINOPRIL 20 MG PO TABS
20.0000 mg | ORAL_TABLET | Freq: Every day | ORAL | Status: DC
  Administered 2021-10-09 – 2021-10-10 (×2): 20 mg via ORAL
  Filled 2021-10-09 (×2): qty 1

## 2021-10-09 MED ORDER — HYDRALAZINE HCL 20 MG/ML IJ SOLN
10.0000 mg | Freq: Four times a day (QID) | INTRAMUSCULAR | Status: DC | PRN
  Administered 2021-10-09 (×2): 10 mg via INTRAVENOUS
  Filled 2021-10-09 (×2): qty 1

## 2021-10-09 MED ORDER — LACTULOSE 10 GM/15ML PO SOLN: 20.0000 g | Freq: Every day | ORAL | Status: DC | PRN

## 2021-10-09 NOTE — Progress Notes (Signed)
Physical Therapy Treatment Patient Details Name: Robert [name] MRN: [id number] DOB: [birth year] Today's Date: 10/09/2021   History of Present Illness 61 yo male s/p CABG 12/29 extubated 12/30. Pt with post op rt weakness LE>UE when evaluated by PT/OT. CT negative. Neuro consulted "Exam findings suggest a new stroke involving the left internal capsule, basal ganglia, thalamus or pons." MRI ordered for after pacing wires removed.   PMH CAD obese DM2 ESRD s/p renal tx kidney transplant in 2018 now with CKD3 HTN    PT Comments    Pt now with improving [name] strength and balance. Ankle DF/PF 2-/5, knee 2+/5, hip 2/5. Functionally pt able to sit EOB unsupported and was able to stand with 2 person assistance using Stedy and then walker. Attempted to step back with walker but only able to move 3-4" due to poor [name] control due to weakness. Pt encouraged by improvement and continues to be very motivated to work toward improving independence. Continue to recommend inpatient rehab for further rehab.    Recommendations for follow up therapy are one component of a multi-disciplinary discharge planning process, led by the attending physician.  Recommendations may be updated based on patient status, additional functional criteria and insurance authorization.  Follow Up Recommendations  Acute inpatient rehab (3hours/day)     Assistance Recommended at Discharge Frequent or constant Supervision/Assistance  Equipment Recommendations  Wheelchair (measurements PT);Wheelchair cushion (measurements PT);Rolling walker (2 wheels)    Recommendations for Other Services Rehab consult     Precautions / Restrictions Precautions Precautions: Fall;Sternal Precaution Comments: R arm restriction due to fistula,     Mobility  Bed Mobility Overal bed mobility: Needs Assistance Bed Mobility: Supine to Sit;Sit to Sidelying     Supine to sit: +2 for physical assistance;HOB elevated;Mod assist   Sit to sidelying: +2 for  physical assistance;Mod assist General bed mobility comments: Assist to bring [name] off of bed, elevate trunk into sitting, and bring hips to EOB. Assist to lower trunk and bring legs back up into bed    Transfers Overall transfer level: Needs assistance Equipment used: Ambulation equipment used;Rolling walker (2 wheels) Transfers: Sit to/from Stand Sit to Stand: +2 physical assistance;Mod assist           General transfer comment: +2 mod assist to stand from bed using bed pad under hips to bring hips up with Stedy or walker. Able to stand from elevated seat of Stedy with +2 min assist. Performed sit to stand x 4 with Stedy and x 1 with rolling waker    Ambulation/Gait Ambulation/Gait assistance: +2 physical assistance;Mod assist Gait Distance (Feet): 0.3 Feet Assistive device: Rolling walker (2 wheels) Gait Pattern/deviations: Step-to pattern;Knee flexed in stance - right;Knees buckling;Decreased step length - right;Decreased step length - left Gait velocity: decr Gait velocity interpretation: <1.31 ft/sec, indicative of household ambulator Pre-gait activities: Stood in [place] x 4 for 1-4 minutes with +2 min assist and worked on wt shift s Programme researcher, broadcasting/film/video Details: After standing at bedside with walker pt required +2 mod assist to slide rt and lt feet back ~3-4". Pt needed assist to move [name] back and for support at rt knee and hip as well as trunk support for balance   Stairs             Wheelchair Mobility    Modified Rankin (Stroke Patients Only) Modified Rankin (Stroke Patients Only) Pre-Morbid Rankin Score: No significant disability Modified Rankin: Severe disability     Balance Overall balance assessment: Needs assistance Sitting-balance support:  Feet supported;No upper extremity supported Sitting balance-Leahy Scale: Fair     Standing balance support: Bilateral upper extremity supported Standing balance-Leahy Scale: Poor Standing balance comment: walker with +2  mod assist for static stand. Stedy with +2 min assist for static stand                            Cognition Arousal/Alertness: Awake/alert Behavior During Therapy: WFL for tasks assessed/performed;Anxious Overall Cognitive Status: Within Functional Limits for tasks assessed                                          Exercises General Exercises - Lower Extremity Quad Sets: Strengthening;Right;5 reps;Supine Heel Slides: AAROM;10 reps;Right;Supine Hip ABduction/ADduction: AAROM;Right;10 reps;Supine    General Comments General comments (skin integrity, edema, etc.): VSS on O2      Pertinent Vitals/Pain Pain Assessment: Faces Faces Pain Scale: Hurts little more Pain Location: chest with return to supine Pain Descriptors / Indicators: Discomfort;Operative site guarding    Home Living                          Prior Function            PT Goals (current goals can now be found in the care plan section) Progress towards PT goals: Progressing toward goals (goals added)    Frequency    Min 4X/week      PT Plan Current plan remains appropriate;Frequency needs to be updated;Equipment recommendations need to be updated    Co-evaluation PT/OT/SLP Co-Evaluation/Treatment: Yes            AM-PAC PT "6 Clicks" Mobility   Outcome Measure  Help needed turning from your back to your side while in a flat bed without using bedrails?: Total Help needed moving from lying on your back to sitting on the side of a flat bed without using bedrails?: Total Help needed moving to and from a bed to a chair (including a wheelchair)?: Total Help needed standing up from a chair using your arms (e.g., wheelchair or bedside chair)?: Total Help needed to walk in hospital room?: Total Help needed climbing 3-5 steps with a railing? : Total 6 Click Score: 6    End of Session Equipment Utilized During Treatment: Oxygen Activity Tolerance: Patient tolerated  treatment well Patient left: with call bell/phone within reach;in bed;with bed alarm set Nurse Communication: Mobility status;Need for lift equipment PT Visit Diagnosis: Other abnormalities of gait and mobility (R26.89);Muscle weakness (generalized) (M62.81);Other symptoms and signs involving the nervous system (R29.898)     Time: 3244-0102 PT Time Calculation (min) (ACUTE ONLY): 45 min  Charges:  $Therapeutic Exercise: 8-22 mins $Therapeutic Activity: 23-37 mins                     [place] Pager [phone] Office [place] 10/09/2021, 2:44 PM

## 2021-10-09 NOTE — Progress Notes (Signed)
°  Transition of Care [facility]) Screening Note   Patient Details  Name: Robert [name] Date of Birth: 11-25-1960   Transition of Care [facility]) CM/SW Contact:    Milas Gain, [place] Phone Number: 10/09/2021, 5:16 PM    Transition of Care Department [facility]) has reviewed patient and no TOC needs have been identified at this time. We will continue to monitor patient advancement through interdisciplinary progression rounds. If new patient transition needs arise, please place a TOC consult.

## 2021-10-09 NOTE — Progress Notes (Signed)
Inpatient Rehab Admissions Coordinator:   I will place a CIR consult per our protocol.    Shann Medal, PT, DPT Admissions Coordinator [phone] 10/09/21  2:47 PM

## 2021-10-09 NOTE — Progress Notes (Signed)
Patients home BiPAP set up and plugged into red outlet, with 2L O2 bled in. Placed patient on his home unit at this time.

## 2021-10-09 NOTE — Plan of Care (Signed)
Neurology plan of care  CT a/p showed no mass lesion that could potentially compress the lumbrosacral plexus. MRI brain, MRA H&N, and MRI L spine pending removal of pacer wires. We will follow up when those studies are available. Please contact us for any neurologic concerns in the interim.  Su Monks, MD [facility] [phone]  If 7pm- 7am, please page neurology on call as listed in [place].

## 2021-10-09 NOTE — Progress Notes (Signed)
Pt states he wants to wait til later to go on BIPAP.

## 2021-10-09 NOTE — Progress Notes (Addendum)
4 Days Post-Op Procedure(s) (LRB): CORONARY ARTERY BYPASS GRAFTING (CABG) X 1, ON PUMP, USING LEFT INTERNAL MAMMARY ARTERY CONDUIT (N/A) AORTIC VALVE REPLACEMENT (AVR) WITH INSPIRIS RESILIA AORTIC VALVE SIZE 27MM (N/A) TRANSESOPHAGEAL ECHOCARDIOGRAM (TEE) (N/A) APPLICATION OF CELL SAVER Subjective: C/o right hip pain  Objective: Vital signs in last 24 hours: Temp:  [97.9 F (36.6 C)-98.6 F (37 C)] 98 F (36.7 C) (01/02 0300) Pulse Rate:  [66-88] 73 (01/02 0700) Cardiac Rhythm: Atrial fibrillation (01/02 0600) Resp:  [15-32] 20 (01/02 0700) BP: (138-178)/(60-85) 159/77 (01/02 0400) SpO2:  [92 %-100 %] 97 % (01/02 0700) FiO2 (%):  [30 %-40 %] 30 % (01/01 2300) Weight:  [137.2 kg] 137.2 kg (01/02 0500)  Hemodynamic parameters for last 24 hours:    Intake/Output from previous day: 01/01 0701 - 01/02 0700 In: 1335.9 [P.O.:840; I.V.:495.9] Out: 5300 [Urine:5300] Intake/Output this shift: No intake/output data recorded.  General appearance: alert, cooperative, and mild distress Neurologic: [name] unchanged Heart: regular rate and rhythm Lungs: diminished breath sounds bibasilar Abdomen: normal findings: soft, non-tender Wound: clean and dry  Lab Results: Recent Labs    10/08/21 0313 10/09/21 0309  WBC 11.1* 7.7  HGB 11.5* 11.2*  HCT 37.4* 35.4*  PLT 146* 128*   BMET:  Recent Labs    10/08/21 0313 10/08/21 1840 10/09/21 0309  NA 134*  --  137  K 4.7 4.5 3.2*  CL 105  --  111  CO2 21*  --  20*  GLUCOSE 148*  --  131*  BUN 44*  --  42*  CREATININE 2.45*  --  2.47*  CALCIUM 8.2*  --  6.9*    PT/INR: No results for input(s): LABPROT, INR in the last 72 hours. ABG    Component Value Date/Time   PHART 7.407 10/06/2021 1053   HCO3 18.0 (L) 10/06/2021 1053   TCO2 19 (L) 10/06/2021 1053   ACIDBASEDEF 6.0 (H) 10/06/2021 1053   O2SAT 74.7 10/09/2021 0309   CBG (last 3)  Recent Labs    10/08/21 1712 10/08/21 2111 10/09/21 0605  GLUCAP 151* 194* 112*     Assessment/Plan: S/P Procedure(s) (LRB): CORONARY ARTERY BYPASS GRAFTING (CABG) X 1, ON PUMP, USING LEFT INTERNAL MAMMARY ARTERY CONDUIT (N/A) AORTIC VALVE REPLACEMENT (AVR) WITH INSPIRIS RESILIA AORTIC VALVE SIZE 27MM (N/A) TRANSESOPHAGEAL ECHOCARDIOGRAM (TEE) (N/A) APPLICATION OF CELL SAVER - CV- in Sr this AM, in and out of A fib overnight  Change amiodarone to PO, continue metoprolol  ASA  If continues to have A fib may need anticoagulation  Restart lisinopril 20 mg daily (BID PTA) RESP- IS for atelectasis RENAL- creatinine stable at 2.47  Diuresed well yesterday  Supplement K  Will Lasix x 1 today GI- tolerating PO, + BM ENDO- on levemir + SSI Deconditioning- activity limited by right leg.  Continue PT/OT   LOS: 4 days    Melrose Nakayama 10/09/2021  CT abdomen/ pelvis recommended by Neurology in yesterday's note but not ordered- will order this AM to r/o retroperitoneal/ psoas hematoma  Remo Lipps C. Roxan Hockey, MD [facility] [phone]

## 2021-10-09 NOTE — Progress Notes (Signed)
° °   °  [address]       [place] [address]             [phone]      Stable day  Was able to do a little more with right leg but still very limited  BP (!) 151/71 (BP Location: Left Arm)    Pulse 71    Temp 98.3 F (36.8 C) (Oral)    Resp (!) 29    Ht 5\' 11"  (1.803 m)    Wt (!) 137.2 kg    SpO2 97%    BMI 42.19 kg/m   Intake/Output Summary (Last 24 hours) at 10/09/2021 1715 Last data filed at 10/09/2021 1700 Gross per 24 hour  Intake 1192.61 ml  Output 4665 ml  Net -3472.39 ml   Hypertension has been an issue today Lisinopril restarted at 20 mg daily(was on BID at home) Lopressor at 25 mg BID- will increase to preop dose of 50 mg BID On Norvasc PRN hydralazine  Remo Lipps C. Roxan Hockey, MD [facility] [phone]

## 2021-10-09 NOTE — Hospital Course (Addendum)
HPI:   The patient is a 64 gentleman with history of hypertension, hyperlipidemia, type 2 diabetes, stage IIIb chronic kidney disease status post nephrectomy at age 61 for congenital nonfunctioning left kidney and subsequent renal transplant in 2018, OSA on CPAP, morbid obesity, PSVT status post ablation in 2018, severe aortic stenosis, and coronary artery disease who was referred for consideration of surgical treatment.  He is here today with his wife.  He reports a several month history of progressive exertional shortness of breath.  He has also been having intermittent upper chest discomfort radiating into his neck and shoulders associated with diaphoresis and nausea.  He denies any dizziness although he did have an episode of vertigo and fell off a ladder.  He did not have any loss of consciousness or head trauma but did injure his right leg and left shoulder.  He had a skin abrasion of his right lower extremity which developed some cellulitis and he was treated with antibiotics.  He denies orthopnea and PND.  He has had some lower extremity edema.   His last echocardiogram on 05/02/2021 showed a trileaflet aortic valve with moderate calcification and thickening with restricted leaflet mobility.  The mean gradient was 44 mmHg with trivial insufficiency.  There was mild enlargement of the ascending aorta at 4.2 cm.  There was moderate to severe mitral annular calcification with mild mitral regurgitation.  Pulmonary pressure was moderately elevated with normal right ventricular function.  Left ventricular ejection fraction was 60 to 65% with moderate LVH and grade 2 diastolic dysfunction.  His previous echocardiogram February 2022 showed a mean gradient of 35 mm.  Cardiac catheterization on 08/22/2021 showed an 80% ostial proximal LAD stenosis followed by a 90% proximal to mid LAD stenosis.  Left circumflex had a large second marginal that 50% stenosis.  The RCA had about 80% mid PDA stenosis which was a  moderate size vessel and an 80% lesion in the AV groove portion before a few small posterolateral branches.  Right heart catheterization showed a PA pressure of 41/11 with a mean right atrial pressure of 4.  Mean wedge pressure was 15 2015.  Cardiac index 2.9.  The mean aortic valve gradient was 40 mmHg peak to peak gradient of 42 mmHg.  Aortic valve area was 0.95 cm.  LVEDP was 22.   The patient is married and lives at home with his wife.  He works from home.  He has never smoked cigarettes.  Dr. Cyndia Bent evaluated the patient and all relevant studies have recommended proceeding with CABG x1 and aortic valve replacement.  Hospital course:  The patient was admitted electively and on 10/05/2021 taken the operating room and underwent coronary artery bypass grafting x1 with a left internal mammary artery graft to the LAD as well as aortic valve replacement using a 27 mm Edwards Inspira's Resilia pericardial tissue valve.  He tolerated the procedure well was taken to the surgical intensive care unit in critical but stable condition.  Postoperative hospital course:  The patient initially was hemodynamically stable but did require milrinone, epinephrine and Levophed for hemodynamic support.  His cardiac indices have shown steady improvement and they have been weaned off over time.Marland Kitchen  He was extubated on postop day 1.  Chest tubes were removed on postop period.  Postop day 1 creatinine was increased to 2.77 and due to his significant renal history including transplant nephrology consultation was obtained to assist.  He was kept on his CellCept and prednisone.  Volume overload was treated  with diuretics. The CCM service was consulted to assist with ICU management.  He was noted to have an expected acute blood loss anemia which stabilized.  He has developed postoperative atrial fibrillation and is being treated with beta-blocker and amiodarone.  He developed some right lower extremity weakness and on postop day 2  neurology consultation was obtained.  Exam findings were concerning for new CVA and patient will be scheduled for an MRI and MRA of the brain after pacer leads are removed.  He will also require other testing and a CT of the abdomen and pelvis was also ordered to rule out psoas hemorrhage or other mass lesion that could potentially compress the lumbosacral plexus.No hemorrhage or hematoma was identified on the CT obtained on 10/08/21.  He had recurrence of th atrial fibrillation and was given an additional IV amiodarone bolus on 10/10/21. Epicardial pacer wires were removed.  The [name] weakness and right hip pain persisted. MRI of the brain and lumbar spine were obtained per neurology recommendations. There were no major obstructive vascular lesions identified but he did have multiple R>L embolic lesions in the brain.  Neurology was re-consulted for assessment and recommended continued anticoagulation with Coumadin, PT/OT, and outpatient neurology follow up.  He did gain some strength in his right upper and lower extremities.  The PT and OT teams recommended continued rehab in a SNF following discharge.  The transition of care team assisted with securing a bed in a facility.  By post-op day 8, the INR was I.8 on a daily Coumadin dose of 5mg . INR went up to 2.1 on 01/07 and to 2.6 the following day. Coumadin was decreased to 2.5 mg daily.Patient was started on Hydralazine for better BP control.  This was increased over time.  Blood pressure control has improved.  His blood sugars have been stable on current regimen.  Hopefully, when he is able to return home he can resume his insulin pump.  Incisions are noted to be healing well without evidence of infection.  He is tolerating diet.  Oxygen has been weaned and maintains good saturations on room air and continues to wear CPAP at night.  His creatinine has remained stable and is, in fact, below preoperative level.  Most recent value was 2.14 with BUN of 51.  He is making  excellent urine.  At the time of discharge the patient is felt to be quite stable to be his transferred to the skilled facility for ongoing rehabilitation.

## 2021-10-10 ENCOUNTER — Other Ambulatory Visit: Payer: Self-pay | Admitting: Cardiology

## 2021-10-10 DIAGNOSIS — I350 Nonrheumatic aortic (valve) stenosis: Secondary | ICD-10-CM

## 2021-10-10 LAB — RENAL FUNCTION PANEL
Albumin: 2.4 g/dL — ABNORMAL LOW (ref 3.5–5.0)
Anion gap: 9 (ref 5–15)
BUN: 48 mg/dL — ABNORMAL HIGH (ref 8–23)
CO2: 23 mmol/L (ref 22–32)
Calcium: 8.9 mg/dL (ref 8.9–10.3)
Chloride: 104 mmol/L (ref 98–111)
Creatinine, Ser: 2.52 mg/dL — ABNORMAL HIGH (ref 0.61–1.24)
GFR, Estimated: 28 mL/min — ABNORMAL LOW (ref >60)
Glucose, Bld: 174 mg/dL — ABNORMAL HIGH (ref 70–99)
Phosphorus: 3.1 mg/dL (ref 2.5–4.6)
Potassium: 4.3 mmol/L (ref 3.5–5.1)
Sodium: 136 mmol/L (ref 135–145)

## 2021-10-10 LAB — CBC
HCT: 37.6 % — ABNORMAL LOW (ref 39.0–52.0)
Hemoglobin: 12 g/dL — ABNORMAL LOW (ref 13.0–17.0)
MCH: 29 pg (ref 26.0–34.0)
MCHC: 31.9 g/dL (ref 30.0–36.0)
MCV: 90.8 fL (ref 80.0–100.0)
Platelets: 197 10*3/uL (ref 150–400)
RBC: 4.14 MIL/uL — ABNORMAL LOW (ref 4.22–5.81)
RDW: 15.8 % — ABNORMAL HIGH (ref 11.5–15.5)
WBC: 7.2 10*3/uL (ref 4.0–10.5)
nRBC: 0 % (ref 0.0–0.2)

## 2021-10-10 LAB — GLUCOSE, CAPILLARY
Glucose-Capillary: 150 mg/dL — ABNORMAL HIGH (ref 70–99)
Glucose-Capillary: 221 mg/dL — ABNORMAL HIGH (ref 70–99)
Glucose-Capillary: 231 mg/dL — ABNORMAL HIGH (ref 70–99)

## 2021-10-10 LAB — COOXEMETRY PANEL
Carboxyhemoglobin: 1.7 % — ABNORMAL HIGH (ref 0.5–1.5)
Methemoglobin: 0.6 % (ref 0.0–1.5)
O2 Saturation: 73.2 %
Total hemoglobin: 12.1 g/dL (ref 12.0–16.0)

## 2021-10-10 LAB — MAGNESIUM: Magnesium: 2.1 mg/dL (ref 1.7–2.4)

## 2021-10-10 MED ORDER — WARFARIN SODIUM 5 MG PO TABS
5.0000 mg | ORAL_TABLET | Freq: Once | ORAL | Status: AC
  Administered 2021-10-10: 5 mg via ORAL
  Filled 2021-10-10: qty 1

## 2021-10-10 MED ORDER — WARFARIN SODIUM 5 MG PO TABS
5.0000 mg | ORAL_TABLET | Freq: Once | ORAL | Status: DC
Start: 2021-10-11 — End: 2021-10-10

## 2021-10-10 MED ORDER — WARFARIN - PHYSICIAN DOSING INPATIENT: Freq: Every day | Status: DC

## 2021-10-10 MED ORDER — ASPIRIN EC 81 MG PO TBEC: 81.0000 mg | DELAYED_RELEASE_TABLET | Freq: Every day | ORAL | Status: DC

## 2021-10-10 MED ORDER — AMIODARONE IV BOLUS ONLY 150 MG/100ML
150.0000 mg | Freq: Once | INTRAVENOUS | Status: AC
  Administered 2021-10-10: 150 mg via INTRAVENOUS
  Filled 2021-10-10: qty 100

## 2021-10-10 MED ORDER — ASPIRIN 81 MG PO CHEW: 81.0000 mg | CHEWABLE_TABLET | Freq: Every day | ORAL | Status: DC

## 2021-10-10 NOTE — Progress Notes (Addendum)
Physical Therapy Treatment Patient Details Name: Robert [name] MRN: [id number] DOB: 1960-12-30 Today's Date: 10/10/2021   History of Present Illness 61 yo male s/p CABG 12/29 extubated 12/30. Pt with post op rt weakness LE>UE when evaluated by PT/OT. CT negative. Neuro consulted "Exam findings suggest a new stroke involving the left internal capsule, basal ganglia, thalamus or pons." MRI ordered for after pacing wires removed.   PMH CAD obese DM2 ESRD s/p renal tx kidney transplant in 2018 now with CKD3 HTN    PT Comments    Today like on Saturday with very little trace movement of [name] and much weaker with RUE. Sitting balance very poor with heavy rt lateral lean. Unable to attempt any mobility past EOB. Yesterday pt had shown significant progression with strength and balance before regression today. Pacing wires out and MRI pending.  Wife unable to provide assist at DC and pt prefers to try slower pace of SNF so DC recommendations updated.   Recommendations for follow up therapy are one component of a multi-disciplinary discharge planning process, led by the attending physician.  Recommendations may be updated based on patient status, additional functional criteria and insurance authorization.  Follow Up Recommendations  Skilled nursing-short term rehab (<3 hours/day)     Assistance Recommended at Discharge Frequent or constant Supervision/Assistance  Patient can return home with the following Two people to help with walking and/or transfers;Other (comment) (Use of mechanical lift)   Equipment Recommendations  Wheelchair (measurements PT);Wheelchair cushion (measurements PT);Hospital bed;Other (comment) (hoyer lift)    Recommendations for Other Services       Precautions / Restrictions Precautions Precautions: Fall;Sternal Restrictions Weight Bearing Restrictions: No     Mobility  Bed Mobility Overal bed mobility: Needs Assistance Bed Mobility: Supine to Sit;Sit to  Sidelying;Rolling Rolling: Total assist;+2 for safety/equipment;+2 for physical assistance   Supine to sit: Total assist;+2 for physical assistance;+2 for safety/equipment;HOB elevated   Sit to sidelying: Total assist;+2 for physical assistance;+2 for safety/equipment General bed mobility comments: significant assist for bed mobility due to weakness, pt able to assist in initiating L LE off of bed and lifting trunk    Transfers                   General transfer comment: unable    Ambulation/Gait                   Stairs             Wheelchair Mobility    Modified Rankin (Stroke Patients Only) Modified Rankin (Stroke Patients Only) Pre-Morbid Rankin Score: No significant disability Modified Rankin: Severe disability     Balance Overall balance assessment: Needs assistance Sitting-balance support: Feet supported;No upper extremity supported Sitting balance-Leahy Scale: Poor Sitting balance - Comments: Pt sat EOB x 12-15 minutes with mod to max assist. Pt with rt lateral and anterior lean. Pt able to tell he is leaning but unable to correct Postural control: Right lateral lean                                  Cognition Arousal/Alertness: Awake/alert Behavior During Therapy: [facility] for tasks assessed/performed;Anxious Overall Cognitive Status: Within Functional Limits for tasks assessed  Exercises General Exercises - Lower Extremity Ankle Circles/Pumps: Right;5 reps;Supine (Poor contraction) Heel Slides: PROM;Right;5 reps;Supine Hip ABduction/ADduction: PROM;Right;5 reps;Supine     General Comments General comments (skin integrity, edema, etc.): VSS      Pertinent Vitals/Pain Pain Assessment: Faces Faces Pain Scale: Hurts little more Pain Location: chest with return to supine Pain Descriptors / Indicators: Discomfort;Operative site guarding Pain Intervention(s): Monitored  during session;Repositioned    Home Living                          Prior Function            PT Goals (current goals can now be found in the care plan section) Progress towards PT goals: Not progressing toward goals - comment    Frequency    Min 3X/week      PT Plan Discharge plan needs to be updated;Frequency needs to be updated    Co-evaluation PT/OT/SLP Co-Evaluation/Treatment: Yes Reason for Co-Treatment: For patient/therapist safety PT goals addressed during session: Mobility/safety with mobility;Balance OT goals addressed during session: ADL's and self-care;Strengthening/ROM      AM-PAC PT "6 Clicks" Mobility   Outcome Measure  Help needed turning from your back to your side while in a flat bed without using bedrails?: Total Help needed moving from lying on your back to sitting on the side of a flat bed without using bedrails?: Total Help needed moving to and from a bed to a chair (including a wheelchair)?: Total Help needed standing up from a chair using your arms (e.g., wheelchair or bedside chair)?: Total Help needed to walk in hospital room?: Total Help needed climbing 3-5 steps with a railing? : Total 6 Click Score: 6    End of Session   Activity Tolerance: Patient tolerated treatment well Patient left: in bed;with call bell/phone within reach Nurse Communication: Mobility status;Need for lift equipment PT Visit Diagnosis: Other abnormalities of gait and mobility (R26.89);Other symptoms and signs involving the nervous system (R29.898)     Time: 2947-6546 PT Time Calculation (min) (ACUTE ONLY): 28 min  Charges:  $Therapeutic Activity: 8-22 mins                     [place] Pager [phone] Office [place] 10/10/2021, 4:28 PM

## 2021-10-10 NOTE — Progress Notes (Signed)
OT Cancellation Note  Patient Details Name: Robert [name] MRN: [id number] DOB: 05-02-1961   Cancelled Treatment:    Reason Eval/Treat Not Completed: Other (comment) per RN, plan to remove pacing wires with subsequent bedrest, as well as plan for MRI today. Plan to check back in PM as schedule permits.  Layla Maw 10/10/2021, 9:56 AM

## 2021-10-10 NOTE — Progress Notes (Signed)
Inpatient Rehab Admissions Coordinator:   I spoke with Pt. Regarding potential CIR admit. He states he is interested in rehab but that his wife has to return to work and cannot care for him afterwards. He also thinks that a slower pace may make sense for him. I called and left a voicemail for his wife to discuss, but he is likely to need SNF.   Clemens Catholic, [place], [place] Admissions Coordinator  [phone] ([place]) [phone] (office)

## 2021-10-10 NOTE — Plan of Care (Signed)
°  Problem: Respiratory: Goal: Ability to maintain a clear airway and adequate ventilation will improve Outcome: Progressing   Problem: Activity: Goal: Risk for activity intolerance will decrease Outcome: Progressing   Problem: Cardiac: Goal: Will achieve and/or maintain hemodynamic stability Outcome: Progressing   Problem: Clinical Measurements: Goal: Diagnostic test results will improve Outcome: Progressing

## 2021-10-10 NOTE — Progress Notes (Signed)
Inpatient Rehab Admissions Coordinator:   I spoke with Pt's wife and she states she is not able to provide 24/7 support after CIR and would prefer SNF. CIR will sign off. I will notify TOC.  Clemens Catholic, [place], [place] Admissions Coordinator  [phone] ([place]) [phone] (office)

## 2021-10-10 NOTE — Progress Notes (Signed)
EVENING ROUNDS NOTE :     [address]       [place] [address]             [phone]                 5 Days Post-Op Procedure(s) (LRB): CORONARY ARTERY BYPASS GRAFTING (CABG) X 1, ON PUMP, USING LEFT INTERNAL MAMMARY ARTERY CONDUIT (N/A) AORTIC VALVE REPLACEMENT (AVR) WITH INSPIRIS RESILIA AORTIC VALVE SIZE 27MM (N/A) TRANSESOPHAGEAL ECHOCARDIOGRAM (TEE) (N/A) APPLICATION OF CELL SAVER   Total Length of Stay:  LOS: 5 days  Events:   No events Resting comfortably Remains in sinus On room air     BP (!) 143/59 (BP Location: Left Arm)    Pulse 78    Temp 98.3 F (36.8 C) (Oral)    Resp (!) 22    Ht 5\' 11"  (1.803 m)    Wt 132 kg    SpO2 97%    BMI 40.59 kg/m          lactated ringers     lactated ringers Stopped (10/08/21 1819)   magnesium sulfate      I/O last 3 completed shifts: In: 1051.8 [P.O.:840; I.V.:211.8] Out: 6750 [Urine:6750]   CBC Latest Ref Rng & Units 10/10/2021 10/09/2021 10/09/2021  WBC 4.0 - 10.5 K/uL 7.2 - 7.7  Hemoglobin 13.0 - 17.0 g/dL 12.0(L) 12.9(L) 11.2(L)  Hematocrit 39.0 - 52.0 % 37.6(L) 38.0(L) 35.4(L)  Platelets 150 - 400 K/uL 197 - 128(L)    BMP Latest Ref Rng & Units 10/10/2021 10/09/2021 10/09/2021  Glucose 70 - 99 mg/dL 174(H) 180(H) 131(H)  BUN 8 - 23 mg/dL 48(H) 46(H) 42(H)  Creatinine 0.61 - 1.24 mg/dL 2.52(H) 2.40(H) 2.47(H)  BUN/Creat Ratio 10 - 24 - - -  Sodium 135 - 145 mmol/L 136 134(L) 137  Potassium 3.5 - 5.1 mmol/L 4.3 4.7 3.2(L)  Chloride 98 - 111 mmol/L 104 103 111  CO2 22 - 32 mmol/L 23 - 20(L)  Calcium 8.9 - 10.3 mg/dL 8.9 - 6.9(L)    ABG    Component Value Date/Time   PHART 7.407 10/06/2021 1053   PCO2ART 28.7 (L) 10/06/2021 1053   PO2ART 63 (L) 10/06/2021 1053   HCO3 18.0 (L) 10/06/2021 1053   TCO2 24 10/09/2021 1651   ACIDBASEDEF 6.0 (H) 10/06/2021 1053   O2SAT 73.2 10/10/2021 0526       Melodie Bouillon, MD 10/10/2021 7:03 PM

## 2021-10-10 NOTE — Progress Notes (Addendum)
TCTS DAILY ICU PROGRESS NOTE                   [address]            [place] [address]          [phone]   5 Days Post-Op Procedure(s) (LRB): CORONARY ARTERY BYPASS GRAFTING (CABG) X 1, ON PUMP, USING LEFT INTERNAL MAMMARY ARTERY CONDUIT (N/A) AORTIC VALVE REPLACEMENT (AVR) WITH INSPIRIS RESILIA AORTIC VALVE SIZE 27MM (N/A) TRANSESOPHAGEAL ECHOCARDIOGRAM (TEE) (N/A) APPLICATION OF CELL SAVER  Total Length of Stay:  LOS: 5 days   Subjective: Up in the bedside chair for past 2 hours. Says he is very uncomfortable due to right hip pain.  Tolerating PO's but not much appetite this morning.  BM x 2 since surgery.   Objective: Vital signs in last 24 hours: Temp:  [96.6 F (35.9 C)-98.3 F (36.8 C)] 98 F (36.7 C) (01/03 0753) Pulse Rate:  [66-93] 93 (01/03 0700) Cardiac Rhythm: Normal sinus rhythm (01/03 0000) Resp:  [15-29] 27 (01/03 0700) BP: (123-202)/(58-101) 140/66 (01/03 0700) SpO2:  [92 %-100 %] 97 % (01/03 0700) Weight:  [132 kg] 132 kg (01/03 0500)  Filed Weights   10/07/21 0500 10/09/21 0500 10/10/21 0500  Weight: (!) 142.7 kg (!) 137.2 kg 132 kg    Weight change: -5.2 kg   Hemodynamic parameters for last 24 hours:    Intake/Output from previous day: 01/02 0701 - 01/03 0700 In: 953.5 [P.O.:840; I.V.:113.5] Out: 5000 [Urine:5000]  Intake/Output this shift: No intake/output data recorded.  Current Meds: Scheduled Meds:  acetaminophen  1,000 mg Oral Q6H   Or   acetaminophen (TYLENOL) oral liquid 160 mg/5 mL  1,000 mg Per Tube Q6H   ALPRAZolam  0.25 mg Oral BID   amiodarone  400 mg Oral BID   amLODipine  10 mg Oral Daily   aspirin EC  325 mg Oral Daily   Or   aspirin  324 mg Per Tube Daily   bisacodyl  10 mg Oral Daily   Or   bisacodyl  10 mg Rectal Daily   Chlorhexidine Gluconate Cloth  6 each Topical Daily   docusate  100 mg Per Tube BID   DULoxetine  60 mg Oral Daily   enoxaparin (LOVENOX) injection  40 mg Subcutaneous QHS    furosemide  40 mg Intravenous Daily   gabapentin  200 mg Oral Q0600   gabapentin  300 mg Oral QHS   insulin aspart  0-9 Units Subcutaneous TID WC   insulin detemir  30 Units Subcutaneous BID   levothyroxine  100 mcg Oral Daily   lisinopril  20 mg Oral Daily   mouth rinse  15 mL Mouth Rinse BID   metoprolol tartrate  50 mg Oral BID   mycophenolate  1,000 mg Oral Q12H   pantoprazole (PROTONIX) IV  40 mg Intravenous QHS   polyethylene glycol  17 g Per Tube Daily   predniSONE  5 mg Oral Q breakfast   rosuvastatin  20 mg Oral Daily   sodium chloride flush  10-40 mL Intracatheter Q12H   sodium chloride flush  3 mL Intravenous Q12H   tamsulosin  0.8 mg Oral QHS   Continuous Infusions:  lactated ringers     lactated ringers Stopped (10/08/21 1819)   magnesium sulfate     nitroGLYCERIN Stopped (10/05/21 1510)   phenylephrine (NEO-SYNEPHRINE) Adult infusion Stopped (10/05/21 1511)   PRN Meds:.dextrose, hydrALAZINE, lactulose, metoprolol tartrate, morphine injection, ondansetron (  ZOFRAN) IV, oxyCODONE, sodium chloride flush, sodium chloride flush, traMADol, traZODone, zolpidem  General appearance: alert, cooperative, and moderate distress Neurologic: MS appropriate, persistent weakness in RUE and [name] Heart: back in a-fib with VR 100-120. Lungs: tachypneic, mild exp wheezes Abdomen: firm, mild distension (he says this is normal for him), no tendreness, hypoactive bowel sounds Extremities: chronic swelling right arm and hand. All exts are well perfused with palpable distal pulses.  Wound: the sternotomy incision is well approximated and dry.  Lab Results: CBC: Recent Labs    10/09/21 0309 10/09/21 1651 10/10/21 0526  WBC 7.7  --  7.2  HGB 11.2* 12.9* 12.0*  HCT 35.4* 38.0* 37.6*  PLT 128*  --  197   BMET:  Recent Labs    10/09/21 0309 10/09/21 1651 10/10/21 0526  NA 137 134* 136  K 3.2* 4.7 4.3  CL 111 103 104  CO2 20*  --  23  GLUCOSE 131* 180* 174*  BUN 42* 46* 48*   CREATININE 2.47* 2.40* 2.52*  CALCIUM 6.9*  --  8.9    CMET: Lab Results  Component Value Date   WBC 7.2 10/10/2021   HGB 12.0 (L) 10/10/2021   HCT 37.6 (L) 10/10/2021   PLT 197 10/10/2021   GLUCOSE 174 (H) 10/10/2021   CHOL 148 07/04/2021   TRIG 54.0 07/04/2021   HDL 69.70 07/04/2021   LDLCALC 67 07/04/2021   ALT 22 10/03/2021   AST 22 10/03/2021   NA 136 10/10/2021   K 4.3 10/10/2021   CL 104 10/10/2021   CREATININE 2.52 (H) 10/10/2021   BUN 48 (H) 10/10/2021   CO2 23 10/10/2021   TSH 1.15 07/04/2021   INR 1.4 (H) 10/05/2021   HGBA1C 7.1 (H) 10/03/2021   MICROALBUR 146.3 (H) 08/03/2019      PT/INR: No results for input(s): LABPROT, INR in the last 72 hours. Radiology: CT ABDOMEN PELVIS WO CONTRAST  Result Date: 10/09/2021 CLINICAL DATA:  Recent kidney transplant surgery. Evaluate for possible retroperitoneal hemorrhage. EXAM: CT ABDOMEN AND PELVIS WITHOUT CONTRAST TECHNIQUE: Multidetector CT imaging of the abdomen and pelvis was performed following the standard protocol without IV contrast. COMPARISON:  04/23/2019 FINDINGS: Lower chest: Small bilateral pleural effusions. Dependent lung base opacities noted consistent with atelectasis. Hepatobiliary: No focal liver abnormality is seen. No gallstones, gallbladder wall thickening, or biliary dilatation. Pancreas: Unremarkable. No pancreatic ductal dilatation or surrounding inflammatory changes. Spleen: Normal in size without focal abnormality. Adrenals/Urinary Tract: No adrenal mass. Marked right renal atrophy. No normal left kidney visualized. Small round focus of low attenuation soft tissue in the left renal fossa is stable. Ureters normal in course and in caliber. Bladder decompressed by a Foley catheter. Right iliac fossa renal transplant kidney is normal in size. No mass. No adjacent fluid collection. No hydronephrosis. Stomach/Bowel: Normal stomach. Small bowel and colon are normal in caliber. No wall thickening. No  inflammation. No evidence of appendicitis. Vascular/Lymphatic: Aortic atherosclerosis. No aneurysm. No enlarged lymph nodes. Reproductive: Unremarkable. Other: No retroperitoneal or intraperitoneal hemorrhage. No ascites. Fluid attenuation tracks along the right flank within the deep subcutaneous soft tissues. No abdominal wall hernia. Musculoskeletal: No fracture or acute finding.  No bone lesion IMPRESSION: 1. No acute findings within the abdomen or pelvis. No evidence of a retroperitoneal hemorrhage or other complication from renal transplant surgery. 2. Small pleural effusions with associated dependent lung base atelectasis. 3. Aortic atherosclerosis. Electronically Signed   By: Lajean Manes M.D.   On: 10/09/2021 09:29  Assessment/Plan: S/P Procedure(s) (LRB): CORONARY ARTERY BYPASS GRAFTING (CABG) X 1, ON PUMP, USING LEFT INTERNAL MAMMARY ARTERY CONDUIT (N/A) AORTIC VALVE REPLACEMENT (AVR) WITH INSPIRIS RESILIA AORTIC VALVE SIZE 27MM (N/A) TRANSESOPHAGEAL ECHOCARDIOGRAM (TEE) (N/A) APPLICATION OF CELL SAVER  -POD5 CABG x 1 and bioprosthetic AVR for severe proximal LAD stenosis and aortic stenosis. Normal EF. Perfusing well.  SBP 140's - 150's, on metoprolol 50mg  BID, amlodipine 10mg  daily, and lisinopril 20mg  daily (he was taking BID prior to admission).  -Post-op atrial fibrillation- converted to SR after IV amiodarone load but now back is AF.  K+ 4.3, Mg++ 1.7 yesterday and he was given oral supplement. Will re-check Mg++ this am, repeat the amiodarone bolus 150mg  x 1.  Plan to remove the pacer wires after amio bolus complete.   -Endo- H/O type 2 DM, A1C 7.1 pre-op.  Glucose control adequate on BID Levemir and SSI. H/O hypothyroidism, back on home dose of levothyroxine.   -RENAL- CKD, s/p renal transplant.  Creat stable, slightly above baseline. Good urine output. On daily oral prednisone and Belatacept (anti-rejection Rx) was given 12/28.  Currently 1kg below pre-op Wt of 133kg.    -NEURO- still having significant weakness in right upper and lower exts that is limiting mobility. Neurology consulted, w/u in progress. CT abd and pelvis showed no evidence of retroperitoneal hematoma.  Proceed with MRA head and neck, MRI brain, and MRI of lumbar spine after pacer wire removal.   -PULM- maintaining sats on 2L/[place] O2. History of OSA, using BiPAP at night. CXR stable with trace pleural effusion on left.   -DVT PPX- on daily enoxaparin.     Antony Odea, PA-C [phone] 10/10/2021 8:05 AM   Chart reviewed, patient examined, agree with above. Has had recurrent atrial fib with RVR overnight and now back in sinus on oral amio. Pacing wires are out. Will plan to start Coumadin tonight for AF with new aortic valve in place.   He has diuresed well and is below preop wt now. No further diuretic needed. Renal function stable.   Right LE weakness of unclear etiology. According to his wife he had significant weakness preop and had to use upper body to pull himself up out of a chair. He has had shuffling gate for a long time due  to inability to move right leg very well. Will get MRI scans as recommended by neurology for further workup. This may all  be preop disability exacerbated by surgery in this morbidly obese gentleman.

## 2021-10-10 NOTE — Progress Notes (Signed)
Occupational Therapy Treatment Patient Details Name: Robert [name] MRN: [id number] DOB: Sep 02, 1961 Today's Date: 10/10/2021   History of present illness 61 yo male s/p CABG 12/29 extubated 12/30. Pt with post op rt weakness LE>UE when evaluated by PT/OT. CT negative. Neuro consulted "Exam findings suggest a new stroke involving the left internal capsule, basal ganglia, thalamus or pons." MRI ordered for after pacing wires removed.   PMH CAD obese DM2 ESRD s/p renal tx kidney transplant in 2018 now with CKD3 HTN   OT comments  Pt limited by significant R sided weakness today, requiring extensive +2 physical assist for bed mobility and to maintain sitting balance EOB. Unable to safely progress standing today. Guided pt in ROM activities sitting EOB with noted weakness in shoulders, biceps and triceps as pt unable to bring R hand to face to assist with basic ADLs (L hand dominant). Continue to rec CIR as pt significantly below functional baseline, motivated to improve and demos good rehab potential. Will continue to follow acutely.    Recommendations for follow up therapy are one component of a multi-disciplinary discharge planning process, led by the attending physician.  Recommendations may be updated based on patient status, additional functional criteria and insurance authorization.    Follow Up Recommendations  Acute inpatient rehab (3hours/day)    Assistance Recommended at Discharge Frequent or constant Supervision/Assistance  Patient can return home with the following  Two people to help with walking and/or transfers;Two people to help with bathing/dressing/bathroom   Equipment Recommendations  BSC/3in1;Wheelchair (measurements OT);Wheelchair cushion (measurements OT);Hospital bed;Other (comment) (hoyer lift)    Recommendations for Other Services Rehab consult    Precautions / Restrictions Precautions Precautions: Fall;Sternal Restrictions Weight Bearing Restrictions: No        Mobility Bed Mobility Overal bed mobility: Needs Assistance Bed Mobility: Supine to Sit;Sit to Sidelying;Rolling Rolling: Total assist;+2 for safety/equipment;+2 for physical assistance   Supine to sit: Total assist;+2 for physical assistance;+2 for safety/equipment;HOB elevated   Sit to sidelying: Total assist;+2 for physical assistance;+2 for safety/equipment General bed mobility comments: significant assist for bed mobility due to weakness, pt able to assist in initiating L LE off of bed and lifting trunk    Transfers                   General transfer comment: unable     Balance Overall balance assessment: Needs assistance Sitting-balance support: Feet supported;No upper extremity supported Sitting balance-Leahy Scale: Poor Sitting balance - Comments: anterior/R lateral LOB if external support not provided. can feel LOB but unable to correct; Mod A - Max A to maintain balance EOB                                   ADL either performed or assessed with clinical judgement   ADL Overall ADL's : Needs assistance/impaired     Grooming: Moderate assistance Grooming Details (indicate cue type and reason): unable to reach face with RUE to assist with tasks. poor balance to attempt  ADL tasks sitting EOB                               General ADL Comments: Session focused on progression of EOB/OOB activity with significant R sided weakness and inability to maintain posture sitting EOB    Extremity/Trunk Assessment Upper Extremity Assessment Upper Extremity Assessment: RUE deficits/detail RUE Deficits /  Details: R UE edema throughout. able to make light fist, requires assist to bend elbow, unable to reach face with R hand. trace movements felt with gravity eliminated, unable to shrug shoulder RUE Coordination: decreased fine motor;decreased gross motor   Lower Extremity Assessment Lower Extremity Assessment: Defer to PT evaluation         Vision   Vision Assessment?: No apparent visual deficits   Perception     Praxis      Cognition Arousal/Alertness: Awake/alert Behavior During Therapy: WFL for tasks assessed/performed;Anxious Overall Cognitive Status: No family/caregiver present to determine baseline cognitive functioning                                 General Comments: appears WFL, flat affect but appropriate responses and follows commands consistently; will need further cog assessment.          Exercises Exercises: Other exercises Other Exercises Other Exercises: shoulder shrugs x 5 sitting EOB (L >R)   Shoulder Instructions       General Comments VSS on supplemental O2    Pertinent Vitals/ Pain       Pain Assessment: Faces Faces Pain Scale: Hurts little more Pain Location: chest with return to supine Pain Descriptors / Indicators: Discomfort;Operative site guarding Pain Intervention(s): Monitored during session;Limited activity within patient's tolerance  Home Living                                          Prior Functioning/Environment              Frequency  Min 2X/week        Progress Toward Goals  OT Goals(current goals can now be found in the care plan section)  Progress towards OT goals: OT to reassess next treatment  Acute Rehab OT Goals Patient Stated Goal: recover OT Goal Formulation: With patient Time For Goal Achievement: 10/21/21 Potential to Achieve Goals: Good ADL Goals Pt Will Perform Grooming: with set-up;sitting Additional ADL Goal #1: pt will complete bed mobility mod (A) with HOB 35 degrees as precursor to adls. Additional ADL Goal #2: pt will verbalize sternal precautsions mod I without cues  Plan Discharge plan remains appropriate    Co-evaluation    PT/OT/SLP Co-Evaluation/Treatment: Yes Reason for Co-Treatment: For patient/therapist safety;To address functional/ADL transfers;Complexity of the patient's impairments  (multi-system involvement)   OT goals addressed during session: ADL's and self-care;Strengthening/ROM      AM-PAC OT "6 Clicks" Daily Activity     Outcome Measure   Help from another person eating meals?: A Lot Help from another person taking care of personal grooming?: A Lot Help from another person toileting, which includes using toliet, bedpan, or urinal?: Total Help from another person bathing (including washing, rinsing, drying)?: Total Help from another person to put on and taking off regular upper body clothing?: A Lot Help from another person to put on and taking off regular lower body clothing?: Total 6 Click Score: 9    End of Session Equipment Utilized During Treatment: Oxygen  OT Visit Diagnosis: Unsteadiness on feet (R26.81);Muscle weakness (generalized) (M62.81)   Activity Tolerance Patient limited by fatigue   Patient Left in bed;with call bell/phone within reach   Nurse Communication Mobility status        Time: 7408-1448 OT Time Calculation (min): 23 min  Charges: OT General  Charges $OT Visit: 1 Visit OT Treatments $Therapeutic Activity: 8-22 mins  Malachy Chamber, OTR/L Acute Rehab Services Office: [phone]   Robert [name] 10/10/2021, 2:20 PM

## 2021-10-10 NOTE — Progress Notes (Signed)
Placed patient on home bipap for the night with oxygen set at 2lpm.

## 2021-10-11 ENCOUNTER — Inpatient Hospital Stay (HOSPITAL_COMMUNITY): Payer: 59

## 2021-10-11 LAB — GLUCOSE, CAPILLARY
Glucose-Capillary: 135 mg/dL — ABNORMAL HIGH (ref 70–99)
Glucose-Capillary: 217 mg/dL — ABNORMAL HIGH (ref 70–99)
Glucose-Capillary: 196 mg/dL — ABNORMAL HIGH (ref 70–99)
Glucose-Capillary: 206 mg/dL — ABNORMAL HIGH (ref 70–99)

## 2021-10-11 LAB — CBC
HCT: 39.2 % (ref 39.0–52.0)
Hemoglobin: 12.1 g/dL — ABNORMAL LOW (ref 13.0–17.0)
MCH: 28.1 pg (ref 26.0–34.0)
MCHC: 30.9 g/dL (ref 30.0–36.0)
MCV: 91 fL (ref 80.0–100.0)
Platelets: 221 10*3/uL (ref 150–400)
RBC: 4.31 MIL/uL (ref 4.22–5.81)
RDW: 15.7 % — ABNORMAL HIGH (ref 11.5–15.5)
WBC: 6.4 10*3/uL (ref 4.0–10.5)
nRBC: 0 % (ref 0.0–0.2)

## 2021-10-11 LAB — BASIC METABOLIC PANEL
Anion gap: 8 (ref 5–15)
BUN: 45 mg/dL — ABNORMAL HIGH (ref 8–23)
CO2: 21 mmol/L — ABNORMAL LOW (ref 22–32)
Calcium: 9.3 mg/dL (ref 8.9–10.3)
Chloride: 106 mmol/L (ref 98–111)
Creatinine, Ser: 2.12 mg/dL — ABNORMAL HIGH (ref 0.61–1.24)
GFR, Estimated: 35 mL/min — ABNORMAL LOW (ref >60)
Glucose, Bld: 145 mg/dL — ABNORMAL HIGH (ref 70–99)
Potassium: 4.6 mmol/L (ref 3.5–5.1)
Sodium: 135 mmol/L (ref 135–145)

## 2021-10-11 LAB — PROTIME-INR
INR: 1 (ref 0.8–1.2)
Prothrombin Time: 13.6 seconds (ref 11.4–15.2)

## 2021-10-11 MED ORDER — SODIUM CHLORIDE 0.9 % IV SOLN: 250.0000 mL | INTRAVENOUS | Status: DC | PRN

## 2021-10-11 MED ORDER — CHLORHEXIDINE GLUCONATE CLOTH 2 % EX PADS: 6.0000 | MEDICATED_PAD | Freq: Every day | CUTANEOUS | Status: DC

## 2021-10-11 MED ORDER — LISINOPRIL 20 MG PO TABS
20.0000 mg | ORAL_TABLET | Freq: Two times a day (BID) | ORAL | Status: DC
  Administered 2021-10-11 – 2021-10-14 (×8): 20 mg via ORAL
  Filled 2021-10-11 (×8): qty 1

## 2021-10-11 MED ORDER — SODIUM CHLORIDE 0.9% FLUSH: 3.0000 mL | INTRAVENOUS | Status: DC | PRN

## 2021-10-11 MED ORDER — WARFARIN SODIUM 5 MG PO TABS
5.0000 mg | ORAL_TABLET | Freq: Every day | ORAL | Status: DC
  Administered 2021-10-11 – 2021-10-14 (×4): 5 mg via ORAL
  Filled 2021-10-11 (×4): qty 1

## 2021-10-11 MED ORDER — PANTOPRAZOLE SODIUM 40 MG PO TBEC
40.0000 mg | DELAYED_RELEASE_TABLET | Freq: Every day | ORAL | Status: DC
  Administered 2021-10-12 – 2021-10-18 (×7): 40 mg via ORAL
  Filled 2021-10-11 (×8): qty 1

## 2021-10-11 MED ORDER — ASPIRIN EC 81 MG PO TBEC
81.0000 mg | DELAYED_RELEASE_TABLET | Freq: Every day | ORAL | Status: DC
  Administered 2021-10-11 – 2021-10-14 (×4): 81 mg via ORAL
  Filled 2021-10-11 (×4): qty 1

## 2021-10-11 MED ORDER — SODIUM CHLORIDE 0.9% FLUSH
3.0000 mL | Freq: Two times a day (BID) | INTRAVENOUS | Status: DC
  Administered 2021-10-11 – 2021-10-18 (×14): 3 mL via INTRAVENOUS

## 2021-10-11 MED ORDER — ~~LOC~~ CARDIAC SURGERY, PATIENT & FAMILY EDUCATION: Freq: Once | Status: AC

## 2021-10-11 NOTE — Progress Notes (Addendum)
Occupational Therapy Treatment Patient Details Name: Robert [name] MRN: [id number] DOB: 1961-04-02 Today's Date: 10/11/2021   History of present illness 61 yo male s/p CABG 12/29 extubated 12/30. Pt with post op rt weakness LE>UE when evaluated by PT/OT. CT negative. MRI completed after pacing wires removed on 1/3. MRI showed acute to subacute scattered small infarcts in  multiple vascular territories (left cerebellum, left MCA and  bilateral PCA) raising the possibility of a recent embolic event, as well as chronic occlusion of R vertebral artery. PMH CAD obese DM2 ESRD s/p renal tx kidney transplant in 2018 now with CKD3 HTN   OT comments  Session focused on strengthening and coordination exercises for R UE/LE with improved function noted in comparison to yesterday's session though significant weakness still present. Pt able to progress rolling in bed Mod-Max A (to L > to R side) and return demonstrate improved coordination with bimanual tasks. Encouraged completion of various UE exercises against gravity and with gravity minimized, as well as bed level LE exercises to further improve strength in prep for EOB/OOB attempts. Noted family feel unable to provide the support needed after a CIR stay and opting for SNF, so updated DC recs to reflect. If pt/family feel progressing well enough to provide the support needed, still feel that pt would be a good CIR candidate.   Recommendations for follow up therapy are one component of a multi-disciplinary discharge planning process, led by the attending physician.  Recommendations may be updated based on patient status, additional functional criteria and insurance authorization.    Follow Up Recommendations  Skilled nursing-short term rehab (<3 hours/day)    Assistance Recommended at Discharge Frequent or constant Supervision/Assistance  Patient can return home with the following  Two people to help with walking and/or transfers;Two people to help with  bathing/dressing/bathroom   Equipment Recommendations  BSC/3in1;Wheelchair (measurements OT);Wheelchair cushion (measurements OT);Hospital bed;Other (comment) (hoyer lift)    Recommendations for Other Services      Precautions / Restrictions Precautions Precautions: Fall;Sternal Precaution Comments: R arm restriction due to fistula Restrictions Weight Bearing Restrictions: No       Mobility Bed Mobility Overal bed mobility: Needs Assistance Bed Mobility: Rolling Rolling: Max assist         General bed mobility comments: able to demo rolling to L side more easily than R side. Mod A to L and Max A to R (improved initiation of bending knee with assist for task). Pt actually able to assist in scooting up towards [facility] with use of L LE as well    Transfers Overall transfer level: Needs assistance                 General transfer comment: did not attempt with 1 person assist     Balance                                           ADL either performed or assessed with clinical judgement   ADL Overall ADL's : Needs assistance/impaired     Grooming: Minimal assistance;Bed level Grooming Details (indicate cue type and reason): simulated with hand to mouth, improved independence with R UE propped on pillows                               General ADL Comments: Guided pt in strengthening  tasks with gravity minimized and against gravity, fine motor tasks with pt able to open straw, turn on fan with small switch    Extremity/Trunk Assessment Upper Extremity Assessment Upper Extremity Assessment: RUE deficits/detail RUE Deficits / Details: chronic edema from kidney replacement though improved from yesterday. improved UE function, especially with gravity minimized though weakness still noted when attempting to lift UE to face from bed level, able to do so from elevation on 2 pillows. Able to grasp squeeze ball effectively and good strength noted in  biceps/triceps with modified MMT. able to shrug shoulders, muscle contraction felt with shoulder to 90* flexion RUE Sensation: WNL RUE Coordination: decreased fine motor;decreased gross motor   Lower Extremity Assessment Lower Extremity Assessment: Defer to PT evaluation        Vision   Vision Assessment?: No apparent visual deficits Additional Comments: blurriness but wears glasses at baseline, denies any new issues. able to look to therapist on B sides without difficulty   Perception     Praxis      Cognition Arousal/Alertness: Awake/alert Behavior During Therapy: [facility] for tasks assessed/performed;Anxious Overall Cognitive Status: Within Functional Limits for tasks assessed                                 General Comments: appears WFL, able to recall this therapist's name and humor from previous session          Exercises Exercises: Other exercises Other Exercises Other Exercises: shoulder shrugs x 5 Other Exercises: light isometric exercise for shoulder flexion, elbow extension/flexion x 5 each Other Exercises: squeeze ball x 5 R hand Other Exercises: gravity minimized R LE abduction/adduction x 10 (with use of maxislide pad) Other Exercises: finger to nose x 5   Shoulder Instructions       General Comments VSS on RA, 2/4 DOE with activities    Pertinent Vitals/ Pain       Pain Assessment: Faces Faces Pain Scale: Hurts little more Pain Location: chest Pain Descriptors / Indicators: Discomfort;Operative site guarding Pain Intervention(s): Monitored during session;Limited activity within patient's tolerance  Home Living                                          Prior Functioning/Environment              Frequency  Min 2X/week        Progress Toward Goals  OT Goals(current goals can now be found in the care plan section)  Progress towards OT goals: Progressing toward goals  Acute Rehab OT Goals Patient Stated Goal:  regain function OT Goal Formulation: With patient Time For Goal Achievement: 10/21/21 Potential to Achieve Goals: Good ADL Goals Pt Will Perform Grooming: with set-up;sitting Additional ADL Goal #1: pt will complete bed mobility mod (A) with HOB 35 degrees as precursor to adls. Additional ADL Goal #2: pt will verbalize sternal precautsions mod I without cues  Plan Discharge plan needs to be updated    Co-evaluation                 AM-PAC OT "6 Clicks" Daily Activity     Outcome Measure   Help from another person eating meals?: A Little Help from another person taking care of personal grooming?: A Little Help from another person toileting, which includes using toliet, bedpan, or urinal?:  Total Help from another person bathing (including washing, rinsing, drying)?: Total Help from another person to put on and taking off regular upper body clothing?: A Lot Help from another person to put on and taking off regular lower body clothing?: Total 6 Click Score: 11    End of Session Equipment Utilized During Treatment: Oxygen  OT Visit Diagnosis: Unsteadiness on feet (R26.81);Muscle weakness (generalized) (M62.81)   Activity Tolerance Patient tolerated treatment well   Patient Left in bed;with call bell/phone within reach   Nurse Communication Mobility status        Time: 1343-1416 OT Time Calculation (min): 33 min  Charges: OT General Charges $OT Visit: 1 Visit OT Treatments $Therapeutic Activity: 8-22 mins $Therapeutic Exercise: 8-22 mins  Malachy Chamber, OTR/L Acute Rehab Services Office: [phone]   Layla Maw 10/11/2021, 2:37 PM

## 2021-10-11 NOTE — NC FL2 (Signed)
[place] LEVEL OF CARE SCREENING TOOL     IDENTIFICATION  Patient Name: Robert [name] Birthdate: 18-Apr-1961 Sex: male Admission Date (Current Location): 10/05/2021  [facility] and [place] Number:  Herbalist and Address:  [facility] [address] [place], [place] [address]      Provider Number: [id number]  Attending Physician Name and Address:  [name], MD  Relative Name and Phone Number:  Verdis Frederickson [phone]    Current Level of Care: Hospital Recommended Level of Care: College Prior Approval Number:    Date Approved/Denied:   PASRR Number: [id number]  Discharge Plan: SNF    Current Diagnoses: Patient Active Problem List   Diagnosis Date Noted   S/P AVR (aortic valve replacement) 10/05/2021   Exertional dyspnea    Aortic valve stenosis    Fissure of skin 03/08/2020   AKI (acute kidney injury) ([place]) 08/08/2017   Diarrhea of presumed infectious origin 08/08/2017   GI bleed 07/26/2017   Kidney transplant status 07/26/2017   Inguinal hernia without obstruction or gangrene 06/30/2017   Tremor of both hands 06/29/2017   Diarrhea 06/28/2017   Hallucinations 06/28/2017   Need for prophylactic immunotherapy 05/20/2017   Hydrocele, acquired 04/07/2017   Scrotal edema 03/14/2017   H/O diabetic neuropathy 03/01/2017   Immunosuppressed status ([place]) 02/26/2017   Prophylactic antibiotic 02/26/2017   Benign neoplasm of transverse colon    Diverticulosis of colon without hemorrhage    Heme positive stool    Morbid obesity ([place]) 02/03/2016   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, with long-term current use of insulin 07/14/2015   Nausea & vomiting    HTN (hypertension) 05/11/2015   Chronic renal failure    SBO (small bowel obstruction) ([place]) 05/04/2015   Peritoneal dialysis catheter in place ([place]) 02/24/2015   Protein-calorie malnutrition, severe  ([place]) 06/15/2014   Uremia of renal origin 06/12/2014   End stage renal disease on dialysis ([place]) 03/22/2014   Fatigue 01/08/2014   H/O unilateral nephrectomy 09/06/2013   Diabetes mellitus type 2 in obese ([place]) 09/06/2013   SVT (supraventricular tachycardia) ([place]) 09/06/2013   Lower extremity edema 09/06/2013   Severe obstructive sleep apnea 09/06/2013   Hypothyroidism 09/06/2013   Hyperlipidemia with target LDL less than 70 09/06/2013    Orientation RESPIRATION BLADDER Height & Weight     Self, Time, Situation, Place  Normal Continent Weight: 296 lb 15.4 oz (134.7 kg) Height:  5\' 11"  (180.3 cm)  BEHAVIORAL SYMPTOMS/MOOD NEUROLOGICAL BOWEL NUTRITION STATUS      Continent Diet (Please see discharge summary)  AMBULATORY STATUS COMMUNICATION OF NEEDS Skin   Extensive Assist Verbally Other (Comment) (Appropriate for ethnicity,dry,Incision closed,chest,other,clean,dry)                       Personal Care Assistance Level of Assistance  Bathing, Feeding, Dressing Bathing Assistance: Maximum assistance Feeding assistance: Limited assistance (able to feed self;Needs assist sitting up) Dressing Assistance: Maximum assistance     Functional Limitations Info  Sight, Hearing, Speech   Hearing Info: Adequate Speech Info: Adequate    SPECIAL CARE FACTORS FREQUENCY  PT (By licensed PT), OT (By licensed OT)     PT Frequency: 5x min weekly OT Frequency: 5x min weekly            Contractures Contractures Info: Not present    Additional Factors Info  Code Status, Psychotropic, Insulin Sliding Scale Code  Status Info: FULL   Psychotropic Info: ALPRAZolam (XANAX) tablet 0.25 mg 2 times daily, Insulin Sliding Scale Info: insulin aspart (novoLOG) injection 0-9 Units 3 times daily with meals,insulin detemir (LEVEMIR) injection 30 Units 2 times daily       Current Medications (10/11/2021):  This is the current hospital active medication list Current Facility-Administered  Medications  Medication Dose Route Frequency Provider Last Rate Last Admin   0.9 %  sodium chloride infusion  250 mL Intravenous PRN [name], MD       ALPRAZolam Duanne Moron) tablet 0.25 mg  0.25 mg Oral BID [name], MD   0.25 mg at 10/11/21 1023   amiodarone (PACERONE) tablet 400 mg  400 mg Oral BID [name], MD   400 mg at 10/11/21 1023   amLODipine (NORVASC) tablet 10 mg  10 mg Oral Daily [name], MD   10 mg at 10/11/21 0703   aspirin EC tablet 81 mg  81 mg Oral Daily [name], MD   81 mg at 10/11/21 1511   Chlorhexidine Gluconate Cloth 2 % PADS 6 each  6 each Topical Daily [name], MD       [facility], Patient & Family Education   Does not apply Once [name], MD       DULoxetine (CYMBALTA) DR capsule 60 mg  60 mg Oral Daily [name], MD   60 mg at 10/11/21 1023   enoxaparin (LOVENOX) injection 40 mg  40 mg Subcutaneous QHS [name], MD   40 mg at 10/10/21 2328   gabapentin (NEURONTIN) capsule 200 mg  200 mg Oral Q0600 [name], MD   200 mg at 10/11/21 0529   gabapentin (NEURONTIN) capsule 300 mg  300 mg Oral QHS [name], MD   300 mg at 10/10/21 2330   insulin aspart (novoLOG) injection 0-9 Units  0-9 Units Subcutaneous TID WC [name], MD   3 Units at 10/11/21 1125   insulin detemir (LEVEMIR) injection 30 Units  30 Units Subcutaneous BID [name], MD   30 Units at 10/11/21 1021   lactulose (CHRONULAC) 10 GM/15ML solution 20 g  20 g Oral Daily PRN [name], MD       levothyroxine (SYNTHROID) tablet 100 mcg  100 mcg Oral Daily [name], MD   100 mcg at 10/11/21 0529   lisinopril (ZESTRIL) tablet 20 mg  20 mg Oral BID [name], MD   20 mg at 10/11/21 1023   MEDLINE mouth rinse  15 mL Mouth Rinse BID [name], MD   15 mL at 10/11/21 1052   metoprolol tartrate (LOPRESSOR) tablet 50 mg  50 mg Oral BID [name], MD   50 mg at 10/11/21 3716   mycophenolate  (CELLCEPT) capsule 1,000 mg  1,000 mg Oral Q12H [name], MD   1,000 mg at 10/11/21 1023   ondansetron (ZOFRAN) injection 4 mg  4 mg Intravenous Q6H PRN [name], MD       oxyCODONE (Oxy IR/ROXICODONE) immediate release tablet 5-10 mg  5-10 mg Oral Q3H PRN [name], MD   10 mg at 10/10/21 2025   [START ON 10/12/2021] pantoprazole (PROTONIX) EC tablet 40 mg  40 mg Oral QAC breakfast [name], MD       predniSONE (DELTASONE) tablet 5 mg  5 mg Oral Q breakfast [name],  MD   5 mg at 10/11/21 0801   rosuvastatin (CRESTOR) tablet 20 mg  20 mg Oral Daily [name], MD   20 mg at 10/11/21 1023   sodium chloride flush (NS) 0.9 % injection 3 mL  3 mL Intravenous Q12H Bartle, Fernande Boyden, MD       sodium chloride flush (NS) 0.9 % injection 3 mL  3 mL Intravenous PRN [name], MD       tamsulosin (FLOMAX) capsule 0.8 mg  0.8 mg Oral QHS [name], MD   0.8 mg at 10/10/21 2331   traMADol (ULTRAM) tablet 50 mg  50 mg Oral Q6H PRN [name], MD   50 mg at 10/07/21 1425   traZODone (DESYREL) tablet 50 mg  50 mg Oral QHS PRN [name], MD   50 mg at 10/08/21 2122   warfarin (COUMADIN) tablet 5 mg  5 mg Oral q1600 [name], MD   5 mg at 10/11/21 1511   Warfarin - Physician Dosing Inpatient   Does not apply q1600 [name], MD   Given at 10/11/21 1513   zolpidem (AMBIEN) tablet 10 mg  10 mg Oral QHS PRN [name], MD   10 mg at 10/07/21 2235     Discharge Medications: Please see discharge summary for a list of discharge medications.  Relevant Imaging Results:  Relevant Lab Results:   Additional Information [phone], Both Covid Vaccines and 1 Booster  Milas Gain, LCSWA

## 2021-10-11 NOTE — TOC Initial Note (Signed)
Transition of Care [facility]) - Initial/Assessment Note    Patient Details  Name: Robert [name] MRN: [id number] Date of Birth: 02/21/1961  Transition of Care [facility]) CM/SW Contact:    Milas Gain, [place] Phone Number: 10/11/2021, 2:40 PM  Clinical Narrative:                  CSW received consult for possible SNF placement at time of discharge. CSW spoke with patient regarding PT recommendation of SNF placement at time of discharge. Patient reports he comes from home with spouse.Patient reported that patient's spouse is currently unable to care for patient at their home given patients current physical needs and fall risk. Patient expressed understanding of PT recommendation and is agreeable to SNF placement at time of discharge. Patient gave CSW permission to fax out initial referral near the [place] area. CSW discussed insurance authorization process and informed patient CSW will provide patient with Medicare compare ratings list of accepted SNF bed offers when available.Patient reports he has received the COVID vaccines as well as one booster. Patient confirmed he has Bipap from home. Patient requested resource for medicaid and disability. CSW provided resource for medicaid and disability to patient. Patient accepted. All questions answered. No further questions reported at this time.Patient expressed being hopeful for rehab and to feel better soon. No further questions reported at this time. CSW to continue to follow and assist with discharge planning needs.   Expected Discharge Plan: Skilled Nursing Facility Barriers to Discharge: Continued Medical Work up   Patient Goals and CMS Choice Patient states their goals for this hospitalization and ongoing recovery are:: SNF CMS Medicare.gov Compare Post Acute Care list provided to:: Patient Choice offered to / list presented to : Patient  Expected Discharge Plan and Services Expected Discharge Plan:  [place] In-house Referral: Clinical Social Work     Living arrangements for the past 2 months: [place]                                      Prior Living Arrangements/Services Living arrangements for the past 2 months: Single Family Home Lives with:: Self, Spouse Patient language and need for interpreter reviewed:: Yes Do you feel safe going back to the place where you live?: No   SNF  Need for Family Participation in Patient Care: Yes (Comment) Care giver support system in place?: Yes (comment)   Criminal Activity/Legal Involvement Pertinent to Current Situation/Hospitalization: No - Comment as needed  Activities of Daily Living Home Assistive Devices/Equipment: Eyeglasses, Blood pressure cuff, CBG Meter, BIPAP ADL Screening (condition at time of admission) Patient's cognitive ability adequate to safely complete daily activities?: Yes Is the patient deaf or have difficulty hearing?: No Does the patient have difficulty seeing, even when wearing glasses/contacts?: No Does the patient have difficulty concentrating, remembering, or making decisions?: No Patient able to express need for assistance with ADLs?: Yes Does the patient have difficulty dressing or bathing?: No Independently performs ADLs?: Yes (appropriate for developmental age) Does the patient have difficulty walking or climbing stairs?: Yes Weakness of Legs: None Weakness of Arms/Hands: Both  Permission Sought/Granted Permission sought to share information with : Case Manager, Family Supports, Chartered certified accountant granted to share information with : Yes, Verbal Permission Granted  Share Information with NAME: Verdis Frederickson  Permission granted to share info w AGENCY: SNF  Permission granted to share info w  Relationship: spouse  Permission granted to share info w Contact Information: Verdis Frederickson [phone]  Emotional Assessment Appearance:: Appears stated  age Attitude/Demeanor/Rapport: Gracious Affect (typically observed): Calm Orientation: : Oriented to Self, Oriented to Place, Oriented to  Time, Oriented to Situation Alcohol / Substance Use: Not Applicable Psych Involvement: No (comment)  Admission diagnosis:  S/P AVR (aortic valve replacement) [Z95.2] Patient Active Problem List   Diagnosis Date Noted   S/P AVR (aortic valve replacement) 10/05/2021   Exertional dyspnea    Aortic valve stenosis    Fissure of skin 03/08/2020   AKI (acute kidney injury) ([place]) 08/08/2017   Diarrhea of presumed infectious origin 08/08/2017   GI bleed 07/26/2017   Kidney transplant status 07/26/2017   Inguinal hernia without obstruction or gangrene 06/30/2017   Tremor of both hands 06/29/2017   Diarrhea 06/28/2017   Hallucinations 06/28/2017   Need for prophylactic immunotherapy 05/20/2017   Hydrocele, acquired 04/07/2017   Scrotal edema 03/14/2017   H/O diabetic neuropathy 03/01/2017   Immunosuppressed status ([place]) 02/26/2017   Prophylactic antibiotic 02/26/2017   Benign neoplasm of transverse colon    Diverticulosis of colon without hemorrhage    Heme positive stool    Morbid obesity ([place]) 02/03/2016   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, with long-term current use of insulin 07/14/2015   Nausea & vomiting    HTN (hypertension) 05/11/2015   Chronic renal failure    SBO (small bowel obstruction) ([place]) 05/04/2015   Peritoneal dialysis catheter in place ([place]) 02/24/2015   Protein-calorie malnutrition, severe ([place]) 06/15/2014   Uremia of renal origin 06/12/2014   End stage renal disease on dialysis ([place]) 03/22/2014   Fatigue 01/08/2014   H/O unilateral nephrectomy 09/06/2013   Diabetes mellitus type 2 in obese ([place]) 09/06/2013   SVT (supraventricular tachycardia) ([place]) 09/06/2013   Lower extremity edema 09/06/2013   Severe obstructive sleep apnea 09/06/2013   Hypothyroidism 09/06/2013    Hyperlipidemia with target LDL less than 70 09/06/2013   PCP:  [name], [name], MD Pharmacy:   [id number] - [place], [place] - [address] AT [place] [place] [address] Phone: [phone] Fax: [phone]     Social Determinants of Health (SDOH) Interventions    Readmission Risk Interventions No flowsheet data found.

## 2021-10-11 NOTE — Progress Notes (Signed)
6 Days Post-Op Procedure(s) (LRB): CORONARY ARTERY BYPASS GRAFTING (CABG) X 1, ON PUMP, USING LEFT INTERNAL MAMMARY ARTERY CONDUIT (N/A) AORTIC VALVE REPLACEMENT (AVR) WITH INSPIRIS RESILIA AORTIC VALVE SIZE 27MM (N/A) TRANSESOPHAGEAL ECHOCARDIOGRAM (TEE) (N/A) APPLICATION OF CELL SAVER Subjective: Had a rough night with MRI since it took several hrs. Did not sleep much. Otherwise feels like right arm strength improving.  MRI brain showed acute to subacute scattered small infarcts in multiple vascular territories (left cerebellum, left MCA and bilateral PCA) raising the possibility of a recent embolic event. No associated hemorrhage or mass effect. The most acute appearing ischemia is in the left centrum semiovale.   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 But no other occlusion or large vessel stenosis is identified on noncontrast MRA. There is a moderate stenosis of the right ACA A2. The left vertebral artery is dominant and there is a fetal type left PCA origin.  Objective: Vital signs in last 24 hours: Temp:  [98.2 F (36.8 C)-98.7 F (37.1 C)] 98.2 F (36.8 C) (01/04 0510) Pulse Rate:  [68-83] 74 (01/04 0715) Cardiac Rhythm: Normal sinus rhythm (01/03 1900) Resp:  [16-30] 16 (01/04 0715) BP: (143-180)/(59-85) 153/70 (01/04 0715) SpO2:  [92 %-97 %] 96 % (01/04 0715) Weight:  [134.7 kg] 134.7 kg (01/04 0500)  Hemodynamic parameters for last 24 hours:    Intake/Output from previous day: 01/03 0701 - 01/04 0700 In: 278.4 [P.O.:180; I.V.:98.4] Out: 3025 [Urine:3025] Intake/Output this shift: No intake/output data recorded.  General appearance: alert and cooperative Neurologic: Improved right arm movement and grip strength. Right leg still weak but able to flex and extend in bed. Heart: regular rate and rhythm, S1, S2 normal, no murmur, Lungs: clear to auscultation bilaterally Abdomen: soft, non-tender; bowel sounds normal Extremities: edema  moderate in RUE. Wound: incision healing well.  Lab Results: Recent Labs    10/10/21 0526 10/11/21 0550  WBC 7.2 6.4  HGB 12.0* 12.1*  HCT 37.6* 39.2  PLT 197 221   BMET:  Recent Labs    10/10/21 0526 10/11/21 0550  NA 136 135  K 4.3 4.6  CL 104 106  CO2 23 21*  GLUCOSE 174* 145*  BUN 48* 45*  CREATININE 2.52* 2.12*  CALCIUM 8.9 9.3    PT/INR:  Recent Labs    10/11/21 0550  LABPROT 13.6  INR 1.0   ABG    Component Value Date/Time   PHART 7.407 10/06/2021 1053   HCO3 18.0 (L) 10/06/2021 1053   TCO2 24 10/09/2021 1651   ACIDBASEDEF 6.0 (H) 10/06/2021 1053   O2SAT 73.2 10/10/2021 0526   CBG (last 3)  Recent Labs    10/10/21 1127 10/10/21 1525 10/11/21 0739  GLUCAP 221* 231* 135*    Assessment/Plan: S/P Procedure(s) (LRB): CORONARY ARTERY BYPASS GRAFTING (CABG) X 1, ON PUMP, USING LEFT INTERNAL MAMMARY ARTERY CONDUIT (N/A) AORTIC VALVE REPLACEMENT (AVR) WITH INSPIRIS RESILIA AORTIC VALVE SIZE 27MM (N/A) TRANSESOPHAGEAL ECHOCARDIOGRAM (TEE) (N/A) APPLICATION OF CELL SAVER  POD 6 Hemodynamically stable with BP rising. Will increase Lisinopril back to 20 bid.  S/P Kidney transplant. Creat back to baseline 2.1 with good urine output. -2700 cc yesterday without diuretic. Wt is about at baseline.  Postop atrial fib, back in sinus on oral amio. Coumadin started last night. Will continue 5 mg daily.  Scattered embolic left brain strokes. He had some debris in his distal aortic arch and descending aorta on TEE. Continue PT/OT.  I think he would benefit from CIR but  apparently his wife does not think she can care for him at home after that so planning on SNF for rehab.  DM: glucose under adequate control on Levemir and SSI.   Will transfer to 4E and continue mobilization.   LOS: 6 days    Robert [name] 10/11/2021

## 2021-10-11 NOTE — Progress Notes (Signed)
Pt arrived to 4E from [place]. VSS. CHG bath done. Tele applied and CCMD called. Pt A&O x4. Oriented to room and call light in reach.   Raelyn Number, RN

## 2021-10-12 DIAGNOSIS — I6310 Cerebral infarction due to embolism of unspecified precerebral artery: Secondary | ICD-10-CM

## 2021-10-12 LAB — GLUCOSE, CAPILLARY
Glucose-Capillary: 132 mg/dL — ABNORMAL HIGH (ref 70–99)
Glucose-Capillary: 130 mg/dL — ABNORMAL HIGH (ref 70–99)
Glucose-Capillary: 157 mg/dL — ABNORMAL HIGH (ref 70–99)
Glucose-Capillary: 163 mg/dL — ABNORMAL HIGH (ref 70–99)

## 2021-10-12 LAB — PROTIME-INR
INR: 1.4 — ABNORMAL HIGH (ref 0.8–1.2)
Prothrombin Time: 17.4 seconds — ABNORMAL HIGH (ref 11.4–15.2)

## 2021-10-12 MED ORDER — LACTULOSE 10 GM/15ML PO SOLN
30.0000 g | Freq: Once | ORAL | Status: AC
Start: 2021-10-12 — End: 2021-10-12
  Administered 2021-10-12: 30 g via ORAL
  Filled 2021-10-12: qty 45

## 2021-10-12 MED ORDER — ROSUVASTATIN CALCIUM 20 MG PO TABS: 40.0000 mg | ORAL_TABLET | Freq: Every day | ORAL | Status: DC

## 2021-10-12 MED ORDER — ACETAMINOPHEN 325 MG PO TABS
650.0000 mg | ORAL_TABLET | ORAL | Status: DC | PRN
  Administered 2021-10-12 – 2021-10-18 (×4): 650 mg via ORAL
  Filled 2021-10-12 (×4): qty 2

## 2021-10-12 MED FILL — Sodium Chloride IV Soln 0.9%: INTRAVENOUS | Qty: 2000 | Status: AC

## 2021-10-12 MED FILL — Potassium Chloride Inj 2 mEq/ML: INTRAVENOUS | Qty: 40 | Status: AC

## 2021-10-12 MED FILL — Heparin Sodium (Porcine) Inj 1000 Unit/ML: INTRAMUSCULAR | Qty: 30 | Status: AC

## 2021-10-12 MED FILL — Sodium Bicarbonate IV Soln 8.4%: INTRAVENOUS | Qty: 50 | Status: AC

## 2021-10-12 MED FILL — Heparin Sodium (Porcine) Inj 1000 Unit/ML: Qty: 1000 | Status: AC

## 2021-10-12 MED FILL — Electrolyte-R (PH 7.4) Solution: INTRAVENOUS | Qty: 3000 | Status: AC

## 2021-10-12 MED FILL — Lidocaine HCl Local Preservative Free (PF) Inj 2%: INTRAMUSCULAR | Qty: 15 | Status: AC

## 2021-10-12 NOTE — Progress Notes (Addendum)
TCTS DAILY ICU PROGRESS NOTE                   [address]            [place] [address]          [phone]   7 Days Post-Op Procedure(s) (LRB): CORONARY ARTERY BYPASS GRAFTING (CABG) X 1, ON PUMP, USING LEFT INTERNAL MAMMARY ARTERY CONDUIT (N/A) AORTIC VALVE REPLACEMENT (AVR) WITH INSPIRIS RESILIA AORTIC VALVE SIZE 27MM (N/A) TRANSESOPHAGEAL ECHOCARDIOGRAM (TEE) (N/A) APPLICATION OF CELL SAVER  Total Length of Stay:  LOS: 7 days   Subjective: Transferred to 4E last evening.  Says he is feeling stronger and notes less swelling in his right hand.  Appetite good. BM x 2 early post-op but none for several days.    Objective: Vital signs in last 24 hours: Temp:  [98 F (36.7 C)-98.6 F (37 C)] 98.4 F (36.9 C) (01/05 0403) Pulse Rate:  [73-89] 73 (01/05 0403) Cardiac Rhythm: Normal sinus rhythm;Bundle branch block (01/04 2044) Resp:  [15-32] 20 (01/05 0403) BP: (125-186)/(61-100) 138/63 (01/05 0403) SpO2:  [87 %-97 %] 96 % (01/05 0403) Weight:  [134.6 kg] 134.6 kg (01/05 0600)  Filed Weights   10/10/21 0500 10/11/21 0500 10/12/21 0600  Weight: 132 kg 134.7 kg 134.6 kg    Weight change: -0.1 kg     Intake/Output from previous day: 01/04 0701 - 01/05 0700 In: -  Out: 3100 [Urine:3100]  Intake/Output this shift: No intake/output data recorded.  Current Meds: Scheduled Meds:  ALPRAZolam  0.25 mg Oral BID   amiodarone  400 mg Oral BID   amLODipine  10 mg Oral Daily   aspirin EC  81 mg Oral Daily   Chlorhexidine Gluconate Cloth  6 each Topical Daily   DULoxetine  60 mg Oral Daily   enoxaparin (LOVENOX) injection  40 mg Subcutaneous QHS   gabapentin  200 mg Oral Q0600   gabapentin  300 mg Oral QHS   insulin aspart  0-9 Units Subcutaneous TID WC   insulin detemir  30 Units Subcutaneous BID   levothyroxine  100 mcg Oral Daily   lisinopril  20 mg Oral BID   mouth rinse  15 mL Mouth Rinse BID   metoprolol tartrate  50 mg Oral BID   mycophenolate   1,000 mg Oral Q12H   pantoprazole  40 mg Oral QAC breakfast   predniSONE  5 mg Oral Q breakfast   rosuvastatin  20 mg Oral Daily   sodium chloride flush  3 mL Intravenous Q12H   tamsulosin  0.8 mg Oral QHS   warfarin  5 mg Oral q1600   Warfarin - Physician Dosing Inpatient   Does not apply q1600   Continuous Infusions:  sodium chloride     PRN Meds:.sodium chloride, lactulose, ondansetron (ZOFRAN) IV, oxyCODONE, sodium chloride flush, traMADol, traZODone, zolpidem  General appearance: alert, cooperative, and moderate distress Neurologic: MS appropriate. Right hand grip strength iproving, also now able to lift his right knee against resistance.  Heart: remains in SR  Lungs: Breath sounds are CTA bilat. Sats ok on RA.  Abdomen: firm, mild distension (he says this is normal for him), no tendreness. Extremities: chronic swelling right arm and hand but improved over past 2 days. All exts are well perfused with palpable distal pulses.  Wound: the sternotomy incision is well approximated and dry.  Lab Results: CBC: Recent Labs    10/10/21 0526 10/11/21 0550  WBC 7.2 6.4  HGB 12.0* 12.1*  HCT 37.6* 39.2  PLT 197 221    BMET:  Recent Labs    10/10/21 0526 10/11/21 0550  NA 136 135  K 4.3 4.6  CL 104 106  CO2 23 21*  GLUCOSE 174* 145*  BUN 48* 45*  CREATININE 2.52* 2.12*  CALCIUM 8.9 9.3     CMET: Lab Results  Component Value Date   WBC 6.4 10/11/2021   HGB 12.1 (L) 10/11/2021   HCT 39.2 10/11/2021   PLT 221 10/11/2021   GLUCOSE 145 (H) 10/11/2021   CHOL 148 07/04/2021   TRIG 54.0 07/04/2021   HDL 69.70 07/04/2021   LDLCALC 67 07/04/2021   ALT 22 10/03/2021   AST 22 10/03/2021   NA 135 10/11/2021   K 4.6 10/11/2021   CL 106 10/11/2021   CREATININE 2.12 (H) 10/11/2021   BUN 45 (H) 10/11/2021   CO2 21 (L) 10/11/2021   TSH 1.15 07/04/2021   INR 1.4 (H) 10/12/2021   HGBA1C 7.1 (H) 10/03/2021   MICROALBUR 146.3 (H) 08/03/2019      PT/INR:  Recent Labs     10/12/21 0220  LABPROT 17.4*  INR 1.4*   Radiology: No results found.   Assessment/Plan: S/P Procedure(s) (LRB): CORONARY ARTERY BYPASS GRAFTING (CABG) X 1, ON PUMP, USING LEFT INTERNAL MAMMARY ARTERY CONDUIT (N/A) AORTIC VALVE REPLACEMENT (AVR) WITH INSPIRIS RESILIA AORTIC VALVE SIZE 27MM (N/A) TRANSESOPHAGEAL ECHOCARDIOGRAM (TEE) (N/A) APPLICATION OF CELL SAVER  -POD7 CABG x 1 and bioprosthetic AVR for severe proximal LAD stenosis and aortic stenosis. Normal EF.  SBP ~140, on metoprolol 50mg  BID, amlodipine 10mg  daily, and lisinopril 20mg   BID  -Post-op atrial fibrillation- maintaining SR on oral amiodarone.  INR 1.5 after Coumadin 5mg  daily x 2 doses. Monitor  -Endo- H/O type 2 DM, A1C 7.1 pre-op.  Glucose control is fair on BID Levemir and SSI. H/O hypothyroidism, back on home dose of levothyroxine.   -RENAL- CKD, s/p renal transplant.  Creat stable. Good urine output. On daily oral prednisone and Belatacept (anti-rejection Rx) was given 12/28.    -NEURO- still having  weakness in right upper and lower exts that is limiting mobility. Seems to be improving.  Findings of imaging as recommended by neurology noted (MRA head and neck, MRI brain, and MRI of lumbar spine). Re-consult neurology for follow up.    -PULM- maintaining sats on RA. History of OSA, using BiPAP at night. CXR stable with trace pleural effusion on left. Coontinue eucouraging pulmonary hygiene.   -GI- no BM for several days. Lactulose today.  -DVT PPX- on daily enoxaparin, will stop when INR therapeutic. Ambulate.  -Disposition- planning for eventual discharge to SNF.     Antony Odea, PA-C [phone] 10/12/2021 7:53 AM    Chart reviewed, patient examined, agree with above. He is making some progress with PT/OT. [name] seems to be the limiting factor with mobilization at this point. Will be able to go to SNF once INR therapeutic.

## 2021-10-12 NOTE — Consult Note (Addendum)
Neurology Progress Note  Brief HPI: Shaman is a 61 year old male with a PMHx of CAD, HLD, T2DM, hypothyroidism, sleep apnea and ESRD s/p renal transplant (2018), who was admitted for AVR and LAD bypass and consulted to the neurology service for [name] weakness.  Subjective: Patient reports improvements in his mobility with PT/OT today. He is able to "step on the gas" with his R foot and lift his R knee, whereas he was unable to do so previously. He endorses that since his procedure, he has had blurry vision R<L, [name] weakness, and difficulty with coordination. He has concerns about his placement upon discharge and had questions about his imaging and anticoagulation, all of which were answered, and he was appreciative.   Exam: Vitals:   10/12/21 0840 10/12/21 1215  BP: (!) 152/59 138/76  Pulse: 82 88  Resp: 19 20  Temp: 98.3 F (36.8 C) 98.2 F (36.8 C)  SpO2: 98% 99%   Physical Exam  Gen: In bed, NAD  HEENT-  /AT.  Cardiovascular- Dressing to chest incision appears C/D/I  Lungs- Tachypneic with wheezing provoked by minimal exertion Extremities- RUE edema, improved today. No BLE edema appreciated   Neurological Examination Mental Status: Alert. Speech is fluent with intact naming and comprehension. Oriented x 5. Good insight.  Cranial Nerves: II: Temporal visual fields intact with no extinction to DSS. PERRL  III,IV, VI: EOMI. No nystagmus. No ptosis. V: Light touch sensation equal bilaterally VII: Facial symmetry resting and smiling. VIII: Hearing intact to voice IX,X: No hoarseness or hypophonia XI: Head is midline XII: Midline tongue extension Motor: LUE 4+/5 proximally and distally RUE 4-/5 proximally and distally [name] 0/5 HF, 1/5 hip extension, 1/5 KE and knee flexion, 0/5 ADF and APF LLE 5/5 proximally and distally  Sensory: Temp intact proximally in all 4 limbs. FT intact proximally and distally in all 4 limbs, but feels dull to patient when touching feet and hands. Absent  proprioception to toes bilaterally.  Deep Tendon Reflexes:  1+ left brachioradialis, 0 right brachioradialis (edema on the right) 2+ bilateral patellae. 1+ bilateral achilles.  Plantars: Mute bilaterally Cerebellar: No ataxia with FNF bilaterally Gait: Unable to assess  Pertinent Labs: Creatinine 2.12, GFR 35. A1c 7.1% (09/2021)  Imaging Reviewed: CT Head: No acute intracranial abnormality. Mild scattered subcortical white matter hypoattenuation bilaterally likely reflects the sequela of chronic microvascular ischemia in the L centrum semiovale.  MRI Head: Patchy restricted diffusion with small scattered cortical infarcts in L superior periolandic area, junction of temporal and occipital lobes, and L lateral cerebellum. Preserved vasculature with tortuous cavernous ICAs. Likely chronic occlusion of distal R Vertebral artery.  MRA Neck: Non-stenotic vasculature.   MRA Head: No stenotic vasculature. Tortuous cavernous ICAs.    Assessment: Davante is a 61 year old male with worsened [name] weakness relative to baseline weakness, as well as worsening vision bilaterally, noted after he regained consciousness following cardiac surgery on 12/29.  Recommendations: Obtain fasting lipid panel, as goal for patient is <70 (increase Rosuvastatin dose to 40 mg). LDL 67 in 06/2021. 2.  Per Cardiology- -Post-op atrial fibrillation- maintaining SR on oral amiodarone.  INR 1.5 after Coumadin 5mg  daily x 2 doses. 3. Continue BP management with amlodpinie 10 mg, lisinopril 20 mg BID, Metoprolol 50 mg BID . Out of the permissive HTN time window, so need to have stricter control.  4. Continue PT and OT.  5. Defer to stroke team for further work-up and recommendations.     Rosezetta Schlatter, MD  PGY-1 10/12/2021

## 2021-10-12 NOTE — Progress Notes (Signed)
Occupational Therapy Treatment Patient Details Name: Robert [name] MRN: [id number] DOB: 10-03-1961 Today's Date: 10/12/2021   History of present illness 61 yo male s/p CABG 12/29 extubated 12/30. Pt with post op rt weakness LE>UE when evaluated by PT/OT. CT negative. MRI completed after pacing wires removed on 1/3. MRI showed acute to subacute scattered small infarcts in  multiple vascular territories (left cerebellum, left MCA and  bilateral PCA) raising the possibility of a recent embolic event, as well as chronic occlusion of R vertebral artery. PMH CAD obese DM2 ESRD s/p renal tx kidney transplant in 2018 now with CKD3 HTN   OT comments  Pt making excellent progress towards OT goals, able to progress standing and transfer to recliner today with decreased physical assist (+2 still helpful for safety due to R knee buckling). Guided pt in various functional reaching and balance tasks seated EOB and in standing. Continue to recommend postacute rehab.   Recommendations for follow up therapy are one component of a multi-disciplinary discharge planning process, led by the attending physician.  Recommendations may be updated based on patient status, additional functional criteria and insurance authorization.    Follow Up Recommendations  Skilled nursing-short term rehab (<3 hours/day)    Assistance Recommended at Discharge Frequent or constant Supervision/Assistance  Patient can return home with the following  Two people to help with walking and/or transfers;A lot of help with bathing/dressing/bathroom   Equipment Recommendations  BSC/3in1;Wheelchair (measurements OT);Wheelchair cushion (measurements OT);Hospital bed    Recommendations for Other Services      Precautions / Restrictions Precautions Precautions: Fall;Sternal Precaution Comments: R arm restriction due to fistula Restrictions Weight Bearing Restrictions: No       Mobility Bed Mobility Overal bed mobility: Needs  Assistance Bed Mobility: Rolling;Sidelying to Sit Rolling: Mod assist;+2 for physical assistance;+2 for safety/equipment Sidelying to sit: Mod assist;+2 for physical assistance       General bed mobility comments: pt followed cues for normal movement well, but needed truncal and LE assist.  2 person truncal assist up from L side.  Once up pt able to scoot symetrically after demo.    Transfers Overall transfer level: Needs assistance Equipment used: Rolling walker (2 wheels) (chair back, tray table.) Transfers: Sit to/from Stand;Bed to chair/wheelchair/BSC Sit to Stand: Min assist;+2 physical assistance (x4)     Squat pivot transfers: Min assist;+2 physical assistance (to mod of 1 person likely)     General transfer comment: pt able to laterally scoot with cues to prepare for squat pivot.  pt needed minimal forward, boost and holding forward assist while pt pivoted.     Balance Overall balance assessment: Needs assistance   Sitting balance-Leahy Scale: Fair Sitting balance - Comments: EOB >15 min supervision.  Pt able to shift weight with reaching minimally outside BOS to complete reaching task   Standing balance support: Single extremity supported;Bilateral upper extremity supported;During functional activity Standing balance-Leahy Scale: Poor Standing balance comment: pt stood x4, worked on pregait activity incl w/shifting, unweighting and control of R knee, plus a function activity in standing picking up small condiment packets, palming them and then placing in a cup.                           ADL either performed or assessed with clinical judgement   ADL Overall ADL's : Needs assistance/impaired     Grooming: Set up;Sitting  Lower Body Dressing: Maximal assistance;Sitting/lateral leans;Bed level Lower Body Dressing Details (indicate cue type and reason): sock mgmt               General ADL Comments: Progression of standing abilities  with improved sitting balance noted as well. Guided pt in weight shifting, fine motor tasks with reaching and standing balance aspects    Extremity/Trunk Assessment Upper Extremity Assessment Upper Extremity Assessment: RUE deficits/detail RUE Deficits / Details: able to perform finger to nose against gravity though coordination deficits still evident, improving edema RUE Sensation: WNL RUE Coordination: decreased fine motor;decreased gross motor   Lower Extremity Assessment Lower Extremity Assessment: Defer to PT evaluation        Vision   Vision Assessment?: No apparent visual deficits   Perception     Praxis      Cognition Arousal/Alertness: Awake/alert Behavior During Therapy: WFL for tasks assessed/performed;Anxious Overall Cognitive Status: Within Functional Limits for tasks assessed                                            Exercises     Shoulder Instructions       General Comments DOE 2/4 VSS on RA    Pertinent Vitals/ Pain       Pain Assessment: Faces Faces Pain Scale: Hurts little more Pain Location: chest Pain Descriptors / Indicators: Discomfort;Operative site guarding Pain Intervention(s): Monitored during session  Home Living                                          Prior Functioning/Environment              Frequency  Min 2X/week        Progress Toward Goals  OT Goals(current goals can now be found in the care plan section)  Progress towards OT goals: Progressing toward goals  Acute Rehab OT Goals Patient Stated Goal: regain strength OT Goal Formulation: With patient Time For Goal Achievement: 10/21/21 Potential to Achieve Goals: Good ADL Goals Pt Will Perform Grooming: with set-up;sitting Additional ADL Goal #1: pt will complete bed mobility mod (A) with HOB 35 degrees as precursor to adls. Additional ADL Goal #2: pt will verbalize sternal precautsions mod I without cues  Plan Discharge plan  remains appropriate    Co-evaluation    PT/OT/SLP Co-Evaluation/Treatment: Yes Reason for Co-Treatment: Complexity of the patient's impairments (multi-system involvement);For patient/therapist safety;To address functional/ADL transfers PT goals addressed during session: Mobility/safety with mobility OT goals addressed during session: Strengthening/ROM;ADL's and self-care      AM-PAC OT "6 Clicks" Daily Activity     Outcome Measure   Help from another person eating meals?: A Little Help from another person taking care of personal grooming?: A Little Help from another person toileting, which includes using toliet, bedpan, or urinal?: Total Help from another person bathing (including washing, rinsing, drying)?: Total Help from another person to put on and taking off regular upper body clothing?: A Lot Help from another person to put on and taking off regular lower body clothing?: A Lot 6 Click Score: 12    End of Session Equipment Utilized During Treatment: Gait belt;Rolling walker (2 wheels)  OT Visit Diagnosis: Unsteadiness on feet (R26.81);Muscle weakness (generalized) (M62.81)   Activity Tolerance Patient tolerated treatment well  Patient Left in chair;with call bell/phone within reach;with chair alarm set;with nursing/sitter in room   Nurse Communication Mobility status        Time: 0413-6438 OT Time Calculation (min): 43 min  Charges: OT General Charges $OT Visit: 1 Visit OT Treatments $Self Care/Home Management : 8-22 mins  Malachy Chamber, OTR/L Acute Rehab Services Office: [phone]   Layla Maw 10/12/2021, 1:02 PM

## 2021-10-12 NOTE — TOC Progression Note (Signed)
Transition of Care [facility]) - Progression Note    Patient Details  Name: Robert [name] MRN: [id number] Date of Birth: 21-Jan-1961  Transition of Care [facility]) CM/SW [place], [place] Phone Number: 10/12/2021, 1:05 PM  Clinical Narrative:     CSW informed wife of bed offers and star ratings. She will review and let CSW know of choice. She is aware SNF choice is needed as soon as possible since he has [employer], commercial.  Thurmond Butts, MSW, LCSW Clinical Social Worker      Expected Discharge Plan: Skilled Nursing Facility Barriers to Discharge: Continued Medical Work up  Expected Discharge Plan and Services Expected Discharge Plan: [place] In-house Referral: Clinical Social Work     Living arrangements for the past 2 months: Single Family Home                                       Social Determinants of Health (SDOH) Interventions    Readmission Risk Interventions No flowsheet data found.

## 2021-10-12 NOTE — TOC Progression Note (Signed)
Transition of Care [facility]) - Progression Note    Patient Details  Name: ESSIE GEHRET MRN: [id number] Date of Birth: 03-12-1961  Transition of Care [facility]) CM/SW [place], [place] Phone Number: 10/12/2021, 11:32 AM  Clinical Narrative:     CSW met with pt bedside and provided bed offers. Pt had many questions regarding insurance and SNF process. CSW explained SNF process and answered questions about insurance (commercial vs medicaid vs medicare). TOC will follow up for choice. Pt requests that his wife receives update.   Expected Discharge Plan: [place] Barriers to Discharge: Continued Medical Work up  Expected Discharge Plan and Services Expected Discharge Plan: [place] In-house Referral: Clinical Social Work     Living arrangements for the past 2 months: Single Family Home                                       Social Determinants of Health (SDOH) Interventions    Readmission Risk Interventions No flowsheet data found.

## 2021-10-12 NOTE — Progress Notes (Signed)
Physical Therapy Treatment Patient Details Name: Robert [name] MRN: [id number] DOB: 1961-05-31 Today's Date: 10/12/2021   History of Present Illness 61 yo male s/p CABG 12/29 extubated 12/30. Pt with post op rt weakness LE>UE when evaluated by PT/OT. CT negative. MRI completed after pacing wires removed on 1/3. MRI showed acute to subacute scattered small infarcts in  multiple vascular territories (left cerebellum, left MCA and  bilateral PCA) raising the possibility of a recent embolic event, as well as chronic occlusion of R vertebral artery. PMH CAD obese DM2 ESRD s/p renal tx kidney transplant in 2018 now with CKD3 HTN    PT Comments    Pt has made great progress in function this session.  Emphasis on rolling, transitions to sitting, scooting symetrically, sitting balance, sit to stands with/without AD's, pre-gait activity and squat-pivot transfer to the recliner, mostly with improving 2 person assist.    Recommendations for follow up therapy are one component of a multi-disciplinary discharge planning process, led by the attending physician.  Recommendations may be updated based on patient status, additional functional criteria and insurance authorization.  Follow Up Recommendations  Skilled nursing-short term rehab (<3 hours/day)     Assistance Recommended at Discharge Frequent or constant Supervision/Assistance  Patient can return home with the following Two people to help with walking and/or transfers   Equipment Recommendations  Wheelchair (measurements PT);Wheelchair cushion (measurements PT);Hospital bed;Other (comment)    Recommendations for Other Services       Precautions / Restrictions Precautions Precautions: Fall;Sternal Precaution Comments: R arm restriction due to fistula     Mobility  Bed Mobility Overal bed mobility: Needs Assistance Bed Mobility: Rolling;Sidelying to Sit Rolling: Mod assist;+2 for physical assistance;+2 for safety/equipment Sidelying to  sit: Mod assist;+2 for physical assistance       General bed mobility comments: pt followed cues for normal movement well, but needed truncal and LE assist.  2 person truncal assist up from L side.  Once up pt able to scoot symetrically after demo.    Transfers Overall transfer level: Needs assistance Equipment used: Rolling walker (2 wheels) (chair back, tray table.) Transfers: Sit to/from Stand;Bed to chair/wheelchair/BSC Sit to Stand: Min assist;+2 physical assistance (x4)   Squat pivot transfers: Min assist;+2 physical assistance (to mod of 1 person likely)       General transfer comment: pt able to laterally scoot with cues to prepare for squat pivot.  pt needed minimal forward, boost and holding forward assist while pt pivoted.    Ambulation/Gait               General Gait Details: NT yet able   Stairs             Wheelchair Mobility    Modified Rankin (Stroke Patients Only) Modified Rankin (Stroke Patients Only) Pre-Morbid Rankin Score: No significant disability Modified Rankin: Severe disability     Balance Overall balance assessment: Needs assistance   Sitting balance-Leahy Scale: Fair Sitting balance - Comments: EOB >15 min supervision.  Pt able to shift weight with reaching minimally outside BOS to complete reaching task   Standing balance support: Single extremity supported;Bilateral upper extremity supported;During functional activity Standing balance-Leahy Scale: Poor Standing balance comment: pt stood x4, worked on pregait activity incl w/shifting, unweighting and control of R knee, plus a function activity in standing picking up small condiment packets, palming them and then placing in a cup.  Cognition Arousal/Alertness: Awake/alert Behavior During Therapy: WFL for tasks assessed/performed;Anxious Overall Cognitive Status: Within Functional Limits for tasks assessed                                           Exercises      General Comments General comments (skin integrity, edema, etc.): vss on RA DOE 1-2/4      Pertinent Vitals/Pain Pain Assessment: Faces Faces Pain Scale: Hurts little more Pain Location: chest Pain Descriptors / Indicators: Discomfort;Operative site guarding Pain Intervention(s): Monitored during session    Home Living                          Prior Function            PT Goals (current goals can now be found in the care plan section) Acute Rehab PT Goals PT Goal Formulation: With patient Time For Goal Achievement: 10/21/21 Potential to Achieve Goals: Fair Progress towards PT goals: Progressing toward goals    Frequency    Min 3X/week      PT Plan Current plan remains appropriate    Co-evaluation PT/OT/SLP Co-Evaluation/Treatment: Yes Reason for Co-Treatment: For patient/therapist safety;Complexity of the patient's impairments (multi-system involvement) PT goals addressed during session: Mobility/safety with mobility OT goals addressed during session: Strengthening/ROM;ADL's and self-care      AM-PAC PT "6 Clicks" Mobility   Outcome Measure  Help needed turning from your back to your side while in a flat bed without using bedrails?: A Lot Help needed moving from lying on your back to sitting on the side of a flat bed without using bedrails?: Total Help needed moving to and from a bed to a chair (including a wheelchair)?: Total Help needed standing up from a chair using your arms (e.g., wheelchair or bedside chair)?: Total Help needed to walk in hospital room?: Total Help needed climbing 3-5 steps with a railing? : Total 6 Click Score: 7    End of Session   Activity Tolerance: Patient tolerated treatment well Patient left: in chair;with call bell/phone within reach;with chair alarm set Nurse Communication: Mobility status PT Visit Diagnosis: Other abnormalities of gait and mobility (R26.89);Other symptoms  and signs involving the nervous system (R29.898)     Time: 1610-9604 PT Time Calculation (min) (ACUTE ONLY): 48 min  Charges:  $Therapeutic Activity: 8-22 mins $Neuromuscular Re-education: 8-22 mins                     10/12/2021  Ginger Carne., PT Acute Rehabilitation Services [phone]  (pager) [phone]  (office)   Tessie Fass Dniya Neuhaus 10/12/2021, 12:53 PM

## 2021-10-13 DIAGNOSIS — I6310 Cerebral infarction due to embolism of unspecified precerebral artery: Secondary | ICD-10-CM

## 2021-10-13 LAB — LIPID PANEL
Cholesterol: 109 mg/dL (ref 0–200)
HDL: 31 mg/dL — ABNORMAL LOW (ref >40)
LDL Cholesterol: 65 mg/dL (ref 0–99)
Total CHOL/HDL Ratio: 3.5 RATIO
Triglycerides: 66 mg/dL (ref <150)
VLDL: 13 mg/dL (ref 0–40)

## 2021-10-13 LAB — BASIC METABOLIC PANEL
Anion gap: 9 (ref 5–15)
BUN: 44 mg/dL — ABNORMAL HIGH (ref 8–23)
CO2: 20 mmol/L — ABNORMAL LOW (ref 22–32)
Calcium: 9.6 mg/dL (ref 8.9–10.3)
Chloride: 105 mmol/L (ref 98–111)
Creatinine, Ser: 2.28 mg/dL — ABNORMAL HIGH (ref 0.61–1.24)
GFR, Estimated: 32 mL/min — ABNORMAL LOW (ref >60)
Glucose, Bld: 98 mg/dL (ref 70–99)
Potassium: 4.6 mmol/L (ref 3.5–5.1)
Sodium: 134 mmol/L — ABNORMAL LOW (ref 135–145)

## 2021-10-13 LAB — GLUCOSE, CAPILLARY
Glucose-Capillary: 119 mg/dL — ABNORMAL HIGH (ref 70–99)
Glucose-Capillary: 184 mg/dL — ABNORMAL HIGH (ref 70–99)
Glucose-Capillary: 218 mg/dL — ABNORMAL HIGH (ref 70–99)
Glucose-Capillary: 273 mg/dL — ABNORMAL HIGH (ref 70–99)

## 2021-10-13 LAB — PROTIME-INR
INR: 1.8 — ABNORMAL HIGH (ref 0.8–1.2)
Prothrombin Time: 20.7 seconds — ABNORMAL HIGH (ref 11.4–15.2)

## 2021-10-13 MED ORDER — ATORVASTATIN CALCIUM 80 MG PO TABS
80.0000 mg | ORAL_TABLET | Freq: Every day | ORAL | Status: DC
  Administered 2021-10-13 – 2021-10-18 (×6): 80 mg via ORAL
  Filled 2021-10-13 (×6): qty 1

## 2021-10-13 NOTE — TOC Progression Note (Signed)
Transition of Care [facility]) - Progression Note    Patient Details  Name: Robert [name] MRN: [id number] Date of Birth: Feb 01, 1961  Transition of Care [facility]) CM/SW [place], [place] Phone Number: 10/13/2021, 3:58 PM  Clinical Narrative:     Received call from spouse- informed [place] - declined  [facility] not in [place]- not sure if they can meet patient's needs- reviewing    CSW encourage family to review and consider bed offers provided as a back up plan if no other offers.  CSW will continue to follow and assist with discharge planning.   Thurmond Butts, MSW, LCSW Clinical Social Worker    Expected Discharge Plan: Skilled Nursing Facility Barriers to Discharge: Continued Medical Work up  Expected Discharge Plan and Services Expected Discharge Plan: [place] In-house Referral: Clinical Social Work     Living arrangements for the past 2 months: Single Family Home                                       Social Determinants of Health (SDOH) Interventions    Readmission Risk Interventions No flowsheet data found.

## 2021-10-13 NOTE — TOC Progression Note (Signed)
Transition of Care [facility] - Progression Note    Patient Details  Name: Robert [name] MRN: [id number] Date of Birth: 1961/03/25  Transition of Care [facility]) CM/SW [place], [place] Phone Number: 10/13/2021, 11:53 AM  Clinical Narrative:     Received call from spouse- requested to expand SNF search- additional referrals were sent -  [facility] - left voice message  [facility]- waiting on response [facility] voice message   Informed : [place] rehab/or private pay [facility]- no bed available   CSW will continue to follow and assist with discharge planning.  Thurmond Butts, MSW, LCSW Clinical Social Worker    Expected Discharge Plan: Skilled Nursing Facility Barriers to Discharge: Continued Medical Work up  Expected Discharge Plan and Services Expected Discharge Plan: [place] In-house Referral: Clinical Social Work     Living arrangements for the past 2 months: Single Family Home                                       Social Determinants of Health (SDOH) Interventions    Readmission Risk Interventions No flowsheet data found.

## 2021-10-13 NOTE — Progress Notes (Addendum)
[address]       [employer] [id number]             [phone]      8 Days Post-Op Procedure(s) (LRB): CORONARY ARTERY BYPASS GRAFTING (CABG) X 1, ON PUMP, USING LEFT INTERNAL MAMMARY ARTERY CONDUIT (N/A) AORTIC VALVE REPLACEMENT (AVR) WITH INSPIRIS RESILIA AORTIC VALVE SIZE 27MM (N/A) TRANSESOPHAGEAL ECHOCARDIOGRAM (TEE) (N/A) APPLICATION OF CELL SAVER Subjective: Some frustration with progress of [name]  Objective: Vital signs in last 24 hours: Temp:  [97.5 F (36.4 C)-98.7 F (37.1 C)] 98.4 F (36.9 C) (01/06 1245) Pulse Rate:  [65-86] 76 (01/06 1245) Cardiac Rhythm: Normal sinus rhythm (01/06 0700) Resp:  [17-26] 20 (01/06 1245) BP: (122-167)/(55-77) 142/65 (01/06 1245) SpO2:  [93 %-97 %] 97 % (01/06 1245)  Hemodynamic parameters for last 24 hours:    Intake/Output from previous day: 01/05 0701 - 01/06 0700 In: 723 [P.O.:720; I.V.:3] Out: 2200 [Urine:2200] Intake/Output this shift: Total I/O In: 453 [P.O.:450; I.V.:3] Out: 700 [Urine:700]  General appearance: alert, cooperative, and no distress Neurologic: no change in [name] Heart: regular rate and rhythm Lungs: clear to auscultation bilaterally Abdomen: some distension, + BS, hyperactive, non tender Extremities: no edema Wound: incis healing well  Lab Results: Recent Labs    10/11/21 0550  WBC 6.4  HGB 12.1*  HCT 39.2  PLT 221   BMET:  Recent Labs    10/11/21 0550 10/13/21 0655  NA 135 134*  K 4.6 4.6  CL 106 105  CO2 21* 20*  GLUCOSE 145* 98  BUN 45* 44*  CREATININE 2.12* 2.28*  CALCIUM 9.3 9.6    PT/INR:  Recent Labs    10/13/21 0655  LABPROT 20.7*  INR 1.8*   ABG    Component Value Date/Time   PHART 7.407 10/06/2021 1053   HCO3 18.0 (L) 10/06/2021 1053   TCO2 24 10/09/2021 1651   ACIDBASEDEF 6.0 (H) 10/06/2021 1053   O2SAT 73.2 10/10/2021 0526   CBG (last 3)  Recent Labs    10/12/21 2043 10/13/21 0605 10/13/21 1106  GLUCAP 163* 119* 184*     Meds Scheduled Meds:  ALPRAZolam  0.25 mg Oral BID   amiodarone  400 mg Oral BID   amLODipine  10 mg Oral Daily   aspirin EC  81 mg Oral Daily   atorvastatin  80 mg Oral Daily   DULoxetine  60 mg Oral Daily   enoxaparin (LOVENOX) injection  40 mg Subcutaneous QHS   gabapentin  200 mg Oral Q0600   gabapentin  300 mg Oral QHS   insulin aspart  0-9 Units Subcutaneous TID WC   insulin detemir  30 Units Subcutaneous BID   levothyroxine  100 mcg Oral Daily   lisinopril  20 mg Oral BID   mouth rinse  15 mL Mouth Rinse BID   metoprolol tartrate  50 mg Oral BID   mycophenolate  1,000 mg Oral Q12H   pantoprazole  40 mg Oral QAC breakfast   predniSONE  5 mg Oral Q breakfast   sodium chloride flush  3 mL Intravenous Q12H   tamsulosin  0.8 mg Oral QHS   warfarin  5 mg Oral q1600   Warfarin - Physician Dosing Inpatient   Does not apply q1600   Continuous Infusions:  sodium chloride     PRN Meds:.sodium chloride, acetaminophen, lactulose, ondansetron (ZOFRAN) IV, oxyCODONE, sodium chloride flush, traMADol, traZODone, zolpidem  Xrays MR ANGIO HEAD WO CONTRAST  Result Date:  10/11/2021 CLINICAL DATA:  61 year old male with hypertension, diabetes, chronic kidney disease. Neurologic deficit with right leg weakness. EXAM: MRI HEAD WITHOUT CONTRAST MRA HEAD WITHOUT CONTRAST MRA NECK WITHOUT CONTRAST TECHNIQUE: Multiplanar, multiecho pulse sequences of the brain and surrounding structures were obtained without intravenous contrast. Angiographic images of the Circle of Willis were obtained using MRA technique without intravenous contrast. Angiographic images of the neck were obtained using MRA technique without intravenous contrast. Carotid stenosis measurements (when applicable) are obtained utilizing NASCET criteria, using the distal internal carotid diameter as the denominator. COMPARISON:  Head CT 10/07/2021 FINDINGS: MRI HEAD FINDINGS Brain: Patchy restricted diffusion. Most intensely restricted  areas are in the left centrum semiovale posterior left frontal lobe (series 5, image 99). But there are also scattered small cortical infarcts in the left superior perirolandic area (series 5, image 106), left lateral cortex at the junction of the temporal and occipital lobes on image 84), but also involving some right parietal and occipital lobe cortex (series 7, image 49, series 5, image 88) and the left lateral cerebellum on series 5, image 73. No brainstem or deep gray matter nuclei involvement identified. T2 and FLAIR hyperintensity compatible with cytotoxic edema in the acutely affected areas. No hemorrhage or mass effect. No definite chronic cerebral blood products. Outside of the acute diffusion abnormality there is mild for age scattered bilateral white matter T2 and FLAIR hyperintensity, with some chronic involvement of the left caudate. No midline shift, mass effect, evidence of mass lesion, ventriculomegaly, extra-axial collection or acute intracranial hemorrhage. Cervicomedullary junction and pituitary are within normal limits. Vascular: Major intracranial vascular flow voids appear preserved with dominant left vertebral artery suspected. See MRA findings below. Tortuous cavernous ICAs. Skull and upper cervical spine: Negative visible cervical spine. Visualized bone marrow signal is within normal limits. Sinuses/Orbits: Negative orbits. Scattered mild paranasal sinus mucosal thickening. Other: Mastoids are clear. Visible internal auditory structures appear normal. Negative visible scalp and face. MRA NECK FINDINGS Two and 3D time-of-flight imaging. Three vessel arch configuration. Antegrade flow in the bilateral cervical carotid and vertebral arteries to the skull base. Common carotids, carotid bifurcations, and cervical ICAs appear within normal limits. No evidence of hemodynamically significant cervical carotid stenosis. The left vertebral artery appears dominant throughout the neck. And the right  vertebral artery appears non dominant throughout. No evidence of hemodynamically significant stenosis. There is a partially retropharyngeal course of the right carotid. MRA HEAD FINDINGS Antegrade flow in the posterior circulation with dominant left vertebral artery supplying the basilar. On the neck MRA images the non dominant right vertebral artery may functionally terminates in muscular branches outside of the skull. However, axial T2 brain appearance suggests there is an occluded right vertebral artery V4 segment. Normal left PICA origin. No basilar stenosis. Patent SCA and right PCA origins. Fetal type left PCA origin. Bilateral PCA branches are within normal limits. Next trache that Antegrade flow in both ICA siphons. Tortuous cavernous ICAs. No siphon stenosis identified. Ophthalmic and left posterior communicating artery origins appear normal. Patent carotid termini, MCA and ACA origins. Diminutive or absent anterior communicating artery. There is a moderate ACA A2 segment stenosis on the right (series 1017, image 12). But otherwise the visible ACA branches are within normal limits. MCA M1 segments and MCA bi/trifurcations appear patent without stenosis. No MCA branch occlusion or stenosis identified. IMPRESSION: 1. Positive for acute to subacute scattered small infarcts in multiple vascular territories (left cerebellum, left MCA and bilateral PCA) raising the possibility of a recent embolic event.  No associated hemorrhage or mass effect. The most acute appearing ischemia is in the left centrum semiovale. 2. 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 But no other occlusion or large vessel stenosis is identified on noncontrast MRA. There is a moderate stenosis of the right ACA A2. The left vertebral artery is dominant and there is a fetal type left PCA origin. 3. Otherwise mild to moderate for age chronic small vessel disease in the brain, most pronounced in the left  caudate. Electronically Signed   By: Genevie Ann M.D.   On: 10/11/2021 06:34   MR ANGIO NECK WO CONTRAST  Result Date: 10/11/2021 CLINICAL DATA:  61 year old male with hypertension, diabetes, chronic kidney disease. Neurologic deficit with right leg weakness. EXAM: MRI HEAD WITHOUT CONTRAST MRA HEAD WITHOUT CONTRAST MRA NECK WITHOUT CONTRAST TECHNIQUE: Multiplanar, multiecho pulse sequences of the brain and surrounding structures were obtained without intravenous contrast. Angiographic images of the Circle of Willis were obtained using MRA technique without intravenous contrast. Angiographic images of the neck were obtained using MRA technique without intravenous contrast. Carotid stenosis measurements (when applicable) are obtained utilizing NASCET criteria, using the distal internal carotid diameter as the denominator. COMPARISON:  Head CT 10/07/2021 FINDINGS: MRI HEAD FINDINGS Brain: Patchy restricted diffusion. Most intensely restricted areas are in the left centrum semiovale posterior left frontal lobe (series 5, image 99). But there are also scattered small cortical infarcts in the left superior perirolandic area (series 5, image 106), left lateral cortex at the junction of the temporal and occipital lobes on image 84), but also involving some right parietal and occipital lobe cortex (series 7, image 49, series 5, image 88) and the left lateral cerebellum on series 5, image 73. No brainstem or deep gray matter nuclei involvement identified. T2 and FLAIR hyperintensity compatible with cytotoxic edema in the acutely affected areas. No hemorrhage or mass effect. No definite chronic cerebral blood products. Outside of the acute diffusion abnormality there is mild for age scattered bilateral white matter T2 and FLAIR hyperintensity, with some chronic involvement of the left caudate. No midline shift, mass effect, evidence of mass lesion, ventriculomegaly, extra-axial collection or acute intracranial hemorrhage.  Cervicomedullary junction and pituitary are within normal limits. Vascular: Major intracranial vascular flow voids appear preserved with dominant left vertebral artery suspected. See MRA findings below. Tortuous cavernous ICAs. Skull and upper cervical spine: Negative visible cervical spine. Visualized bone marrow signal is within normal limits. Sinuses/Orbits: Negative orbits. Scattered mild paranasal sinus mucosal thickening. Other: Mastoids are clear. Visible internal auditory structures appear normal. Negative visible scalp and face. MRA NECK FINDINGS Two and 3D time-of-flight imaging. Three vessel arch configuration. Antegrade flow in the bilateral cervical carotid and vertebral arteries to the skull base. Common carotids, carotid bifurcations, and cervical ICAs appear within normal limits. No evidence of hemodynamically significant cervical carotid stenosis. The left vertebral artery appears dominant throughout the neck. And the right vertebral artery appears non dominant throughout. No evidence of hemodynamically significant stenosis. There is a partially retropharyngeal course of the right carotid. MRA HEAD FINDINGS Antegrade flow in the posterior circulation with dominant left vertebral artery supplying the basilar. On the neck MRA images the non dominant right vertebral artery may functionally terminates in muscular branches outside of the skull. However, axial T2 brain appearance suggests there is an occluded right vertebral artery V4 segment. Normal left PICA origin. No basilar stenosis. Patent SCA and right PCA origins. Fetal type left PCA origin. Bilateral PCA branches  are within normal limits. Next trache that Antegrade flow in both ICA siphons. Tortuous cavernous ICAs. No siphon stenosis identified. Ophthalmic and left posterior communicating artery origins appear normal. Patent carotid termini, MCA and ACA origins. Diminutive or absent anterior communicating artery. There is a moderate ACA A2  segment stenosis on the right (series 1017, image 12). But otherwise the visible ACA branches are within normal limits. MCA M1 segments and MCA bi/trifurcations appear patent without stenosis. No MCA branch occlusion or stenosis identified. IMPRESSION: 1. Positive for acute to subacute scattered small infarcts in multiple vascular territories (left cerebellum, left MCA and bilateral PCA) raising the possibility of a recent embolic event. No associated hemorrhage or mass effect. The most acute appearing ischemia is in the left centrum semiovale. 2. 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 But no other occlusion or large vessel stenosis is identified on noncontrast MRA. There is a moderate stenosis of the right ACA A2. The left vertebral artery is dominant and there is a fetal type left PCA origin. 3. Otherwise mild to moderate for age chronic small vessel disease in the brain, most pronounced in the left caudate. Electronically Signed   By: Genevie Ann M.D.   On: 10/11/2021 06:34   MR BRAIN WO CONTRAST  Result Date: 10/11/2021 CLINICAL DATA:  61 year old male with hypertension, diabetes, chronic kidney disease. Neurologic deficit with right leg weakness. EXAM: MRI HEAD WITHOUT CONTRAST MRA HEAD WITHOUT CONTRAST MRA NECK WITHOUT CONTRAST TECHNIQUE: Multiplanar, multiecho pulse sequences of the brain and surrounding structures were obtained without intravenous contrast. Angiographic images of the Circle of Willis were obtained using MRA technique without intravenous contrast. Angiographic images of the neck were obtained using MRA technique without intravenous contrast. Carotid stenosis measurements (when applicable) are obtained utilizing NASCET criteria, using the distal internal carotid diameter as the denominator. COMPARISON:  Head CT 10/07/2021 FINDINGS: MRI HEAD FINDINGS Brain: Patchy restricted diffusion. Most intensely restricted areas are in the left centrum semiovale  posterior left frontal lobe (series 5, image 99). But there are also scattered small cortical infarcts in the left superior perirolandic area (series 5, image 106), left lateral cortex at the junction of the temporal and occipital lobes on image 84), but also involving some right parietal and occipital lobe cortex (series 7, image 49, series 5, image 88) and the left lateral cerebellum on series 5, image 73. No brainstem or deep gray matter nuclei involvement identified. T2 and FLAIR hyperintensity compatible with cytotoxic edema in the acutely affected areas. No hemorrhage or mass effect. No definite chronic cerebral blood products. Outside of the acute diffusion abnormality there is mild for age scattered bilateral white matter T2 and FLAIR hyperintensity, with some chronic involvement of the left caudate. No midline shift, mass effect, evidence of mass lesion, ventriculomegaly, extra-axial collection or acute intracranial hemorrhage. Cervicomedullary junction and pituitary are within normal limits. Vascular: Major intracranial vascular flow voids appear preserved with dominant left vertebral artery suspected. See MRA findings below. Tortuous cavernous ICAs. Skull and upper cervical spine: Negative visible cervical spine. Visualized bone marrow signal is within normal limits. Sinuses/Orbits: Negative orbits. Scattered mild paranasal sinus mucosal thickening. Other: Mastoids are clear. Visible internal auditory structures appear normal. Negative visible scalp and face. MRA NECK FINDINGS Two and 3D time-of-flight imaging. Three vessel arch configuration. Antegrade flow in the bilateral cervical carotid and vertebral arteries to the skull base. Common carotids, carotid bifurcations, and cervical ICAs appear within normal limits. No evidence of hemodynamically  significant cervical carotid stenosis. The left vertebral artery appears dominant throughout the neck. And the right vertebral artery appears non dominant  throughout. No evidence of hemodynamically significant stenosis. There is a partially retropharyngeal course of the right carotid. MRA HEAD FINDINGS Antegrade flow in the posterior circulation with dominant left vertebral artery supplying the basilar. On the neck MRA images the non dominant right vertebral artery may functionally terminates in muscular branches outside of the skull. However, axial T2 brain appearance suggests there is an occluded right vertebral artery V4 segment. Normal left PICA origin. No basilar stenosis. Patent SCA and right PCA origins. Fetal type left PCA origin. Bilateral PCA branches are within normal limits. Next trache that Antegrade flow in both ICA siphons. Tortuous cavernous ICAs. No siphon stenosis identified. Ophthalmic and left posterior communicating artery origins appear normal. Patent carotid termini, MCA and ACA origins. Diminutive or absent anterior communicating artery. There is a moderate ACA A2 segment stenosis on the right (series 1017, image 12). But otherwise the visible ACA branches are within normal limits. MCA M1 segments and MCA bi/trifurcations appear patent without stenosis. No MCA branch occlusion or stenosis identified. IMPRESSION: 1. Positive for acute to subacute scattered small infarcts in multiple vascular territories (left cerebellum, left MCA and bilateral PCA) raising the possibility of a recent embolic event. No associated hemorrhage or mass effect. The most acute appearing ischemia is in the left centrum semiovale. 2. 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 But no other occlusion or large vessel stenosis is identified on noncontrast MRA. There is a moderate stenosis of the right ACA A2. The left vertebral artery is dominant and there is a fetal type left PCA origin. 3. Otherwise mild to moderate for age chronic small vessel disease in the brain, most pronounced in the left caudate. Electronically Signed   By: Genevie Ann M.D.   On: 10/11/2021 06:34   MR LUMBAR SPINE WO CONTRAST  Result Date: 10/11/2021 CLINICAL DATA:  61 year old male with right lower extremity pain and weakness. Chronic kidney disease, renal transplant. EXAM: MRI LUMBAR SPINE WITHOUT CONTRAST TECHNIQUE: Multiplanar, multisequence MR imaging of the lumbar spine was performed. No intravenous contrast was administered. COMPARISON:  Brain MRI today reported separately. CT Abdomen and Pelvis 10/09/2021. Abdominal radiographs 06/24/2014. FINDINGS: Segmentation:  Normal on the recent CT. Alignment: Stable lumbar lordosis. Mild straightening with no significant spondylolisthesis. Vertebrae: Abnormal heterogeneous signal in the L1-L2 disc space, where pronounced vacuum disc was present on the recent CT. STIR and T2 hyperintensity in addition to gas within the disc space now (series 14, image 9). But mostly chronic appearing degenerative endplate marrow signal changes there. No convincing marrow edema. Mildly heterogeneous signal also in the L3-L4 disc with vacuum phenomena there on the recent CT also. Background bone marrow signal within normal limits. No acute osseous abnormality identified. Conus medullaris and cauda equina: Conus extends to the T12-L1 level. No lower spinal cord or conus signal abnormality. Normal spinal canal patency at most levels. Unremarkable noncontrast cauda equina nerve root appearance. Paraspinal and other soft tissues: Stable visible abdominal viscera from the recent CT. Negative paraspinal soft tissues. Disc levels: T11-T12: Negative. T12-L1:  Negative. L1-L2: Disc space loss with circumferential disc osteophyte complex eccentric to the left. Mild spinal and left lateral recess stenosis (left L2 nerve level). Mild left L1 foraminal stenosis. L2-L3: Mild circumferential disc bulge and endplate spurring. No significant stenosis. L3-L4: Mild disc space loss with circumferential disc bulge and endplate  spurring. Bulky foraminal involvement  greater on the right. No spinal or lateral recess stenosis. Mild to moderate right L3 foraminal stenosis. L4-L5: Circumferential disc bulge with broad-based posterior and foraminal involvement. Mild facet and ligament flavum hypertrophy. No spinal or convincing lateral recess stenosis. Moderate bilateral L4 foraminal stenosis. L5-S1: Disc space loss. Circumferential disc bulge with endplate spurring eccentric to the right. Mild facet hypertrophy. No spinal or convincing lateral recess stenosis. Moderate to severe bilateral L5 foraminal stenosis. IMPRESSION: 1. Abnormal signal in the L1-L2 disc appears to be degenerative in nature. No convincing discitis osteomyelitis at this time. But if there is progressive back pain or bacteremia than repeat MRI would be recommended in 2-3 weeks to exclude infection. 2. Multilevel lumbar disc and endplate degeneration. Isolated mild spinal and left lateral recess stenosis at L1-L2. Neural foraminal stenosis is moderate at the right L3, bilateral L4 nerve levels, and moderate to severe at the bilateral L5 nerve levels. Electronically Signed   By: Genevie Ann M.D.   On: 10/11/2021 06:41     Assessment/Plan: S/P Procedure(s) (LRB): CORONARY ARTERY BYPASS GRAFTING (CABG) X 1, ON PUMP, USING LEFT INTERNAL MAMMARY ARTERY CONDUIT (N/A) AORTIC VALVE REPLACEMENT (AVR) WITH INSPIRIS RESILIA AORTIC VALVE SIZE 27MM (N/A) TRANSESOPHAGEAL ECHOCARDIOGRAM (TEE) (N/A) APPLICATION OF CELL SAVER POD#8  1 afeb, VSS sBP 130's-160's, HR 65-88, sinus rhythm, cont current cardiac meds 2 sats ok on RA, CPAP @night  3 good UOP, voiding, not all measured 4 INR 1.8 cont 5 of coumadin. On lovenox 5 CBG's good control, was on insulin pump at home 6 neuro , results noted, neuro saw again yesterday0recs noted 7 SNF being worked on by [facility] 8 conts therapies     LOS: 8 days    John Giovanni PA-C Pager [phone] 10/13/2021    Chart reviewed, patient examined, agree with above. He is slowly  making progress.  Maintaining sinus rhythm and INR is increasing on 5 mg Coumadin daily. It is 1.8 today and if it increases tomorrow we can stop Lovenox and ASA. Goal INR 2-2.5. Hopefully can go to SNF early next week.

## 2021-10-14 LAB — PROTIME-INR
INR: 2.1 — ABNORMAL HIGH (ref 0.8–1.2)
Prothrombin Time: 23.8 seconds — ABNORMAL HIGH (ref 11.4–15.2)

## 2021-10-14 LAB — GLUCOSE, CAPILLARY
Glucose-Capillary: 138 mg/dL — ABNORMAL HIGH (ref 70–99)
Glucose-Capillary: 164 mg/dL — ABNORMAL HIGH (ref 70–99)
Glucose-Capillary: 136 mg/dL — ABNORMAL HIGH (ref 70–99)
Glucose-Capillary: 199 mg/dL — ABNORMAL HIGH (ref 70–99)
Glucose-Capillary: 220 mg/dL — ABNORMAL HIGH (ref 70–99)

## 2021-10-14 MED ORDER — INSULIN DETEMIR 100 UNIT/ML ~~LOC~~ SOLN
34.0000 [IU] | Freq: Two times a day (BID) | SUBCUTANEOUS | Status: DC
  Administered 2021-10-14: 34 [IU] via SUBCUTANEOUS
  Filled 2021-10-14 (×3): qty 0.34

## 2021-10-14 MED ORDER — HYDRALAZINE HCL 10 MG PO TABS
10.0000 mg | ORAL_TABLET | Freq: Two times a day (BID) | ORAL | Status: DC
  Administered 2021-10-14 (×2): 10 mg via ORAL
  Filled 2021-10-14 (×2): qty 1

## 2021-10-14 NOTE — TOC Progression Note (Signed)
Transition of Care [facility]) - Progression Note    Patient Details  Name: Robert [name] MRN: [id number] Date of Birth: [birth year]  [facility] CM/SW [place], [place] Phone Number: 10/14/2021, 12:13 PM  Clinical Narrative:     Received call from patient's wife-advised [facility] is reviewing - not sure if they accommodate bariatric needs- [place]/with admission advised she will follow up with CSW once a decision has been made. Again, CSW encouraged spouse to consider bed offers provided as second choice. She states understanding.  Thurmond Butts, MSW, LCSW Clinical Social Worker    Expected Discharge Plan: Skilled Nursing Facility Barriers to Discharge: Continued Medical Work up  Expected Discharge Plan and Services Expected Discharge Plan: [place] In-house Referral: Clinical Social Work     Living arrangements for the past 2 months: Single Family Home                                       Social Determinants of Health (SDOH) Interventions    Readmission Risk Interventions No flowsheet data found.

## 2021-10-14 NOTE — Progress Notes (Signed)
Mobility Specialist Progress Note:   10/14/21 1333  Mobility  Activity Transferred:  Bed to chair  Level of Assistance Minimal assist, patient does 75% or more  Assistive Device Stedy  Mobility Out of bed to chair with meals  Mobility Response Tolerated well  Mobility performed by Mobility specialist;Nurse  Bed Position Chair  $Mobility charge 1 Mobility   Pt received in bed wanting to sit in chair. MinA to stand from elevated bed into stedy. No assistance to stand in stedy. Left in chair with call bell in reach and all needs met.   [facility] Public librarian Phone [phone] Secondary Phone [phone]

## 2021-10-14 NOTE — Progress Notes (Addendum)
° °   °  [address]       [name] [address]             [phone]        9 Days Post-Op Procedure(s) (LRB): CORONARY ARTERY BYPASS GRAFTING (CABG) X 1, ON PUMP, USING LEFT INTERNAL MAMMARY ARTERY CONDUIT (N/A) AORTIC VALVE REPLACEMENT (AVR) WITH INSPIRIS RESILIA AORTIC VALVE SIZE 27MM (N/A) TRANSESOPHAGEAL ECHOCARDIOGRAM (TEE) (N/A) APPLICATION OF CELL SAVER  Subjective: Patient still with [name] weakness. His right hand strength has improved.  Objective: Vital signs in last 24 hours: Temp:  [97.5 F (36.4 C)-98.4 F (36.9 C)] 98.2 F (36.8 C) (01/07 0824) Pulse Rate:  [68-80] 78 (01/07 0824) Cardiac Rhythm: Normal sinus rhythm (01/06 1939) Resp:  [19-20] 20 (01/07 0824) BP: (122-159)/(55-73) 148/63 (01/07 0824) SpO2:  [92 %-98 %] 95 % (01/07 0824)  Pre op weight 133.1 kg Current Weight  10/12/21 134.6 kg      Intake/Output from previous day: 01/06 0701 - 01/07 0700 In: 553 [P.O.:550; I.V.:3] Out: 2380 [Urine:2380]   Physical Exam:  Cardiovascular: RRR, no murmur Pulmonary: Clear to auscultation bilaterally Abdomen: Soft, non tender, bowel sounds present. Extremities: SCDs in place Wound: Clean and dry.  No erythema or signs of infection.  Lab Results: CBC:No results for input(s): WBC, HGB, HCT, PLT in the last 72 hours. BMET:  Recent Labs    10/13/21 0655  NA 134*  K 4.6  CL 105  CO2 20*  GLUCOSE 98  BUN 44*  CREATININE 2.28*  CALCIUM 9.6    PT/INR:  Lab Results  Component Value Date   INR 2.1 (H) 10/14/2021   INR 1.8 (H) 10/13/2021   INR 1.4 (H) 10/12/2021   ABG:  INR: Will add last result for INR, ABG once components are confirmed Will add last 4 CBG results once components are confirmed  Assessment/Plan:  1. CV - Had post op a fib. Maintaining SR. SBP remains in 140-150's. On Lopressor 50 mg bid, Lisinopril 20 mg bid, Amlodipine 10 mg daily, Coumadin, and Amiodarone 400 mg bid. INR this am with slight increase to  2.1-has been given 3 doses of 5 mg. Will decrease Coumadin dose and start Hydralazine for better BP control. Will stop Lovenox and ec asa 2.  Pulmonary - On room air. He has OSA and uses bi pap at night. He states he is sleeping better as has oxygen in addition to bi pap;requesting if that can be arranged for home. Encourage incentive spirometer. 3. History of CKD (stage III)-Last creatinine 2.28. Creatinine prior to surgery 2.3. He has had renal transplant 2018-continue Prednisone and Cellcept 4.  Expected post op acute blood loss anemia - Last H and H stable at 12.1 and 39.2 5. History of DM-CBGs 273/138/164. On Insulin but will increase for better glucose control. Pre op HGA1C 7.1 6. Neuro-Scattered embolic left brain strokes. He had some debris in his distal aortic arch and descending aorta on TEE. He has had [name] weakness. Continue PT/OT. Neurology following 7. History of hypothyroidism-continue Levothyroxine 100 mcg daily 8. Will need SNF at discharge  [place] 10/14/2021,8:43 AM    Doing well Decreasing coumadin to 2.5  Robert [name] Robert [name]

## 2021-10-15 LAB — BASIC METABOLIC PANEL
Anion gap: 6 (ref 5–15)
BUN: 51 mg/dL — ABNORMAL HIGH (ref 8–23)
CO2: 21 mmol/L — ABNORMAL LOW (ref 22–32)
Calcium: 9.2 mg/dL (ref 8.9–10.3)
Chloride: 107 mmol/L (ref 98–111)
Creatinine, Ser: 2.14 mg/dL — ABNORMAL HIGH (ref 0.61–1.24)
GFR, Estimated: 34 mL/min — ABNORMAL LOW (ref >60)
Glucose, Bld: 167 mg/dL — ABNORMAL HIGH (ref 70–99)
Potassium: 4.5 mmol/L (ref 3.5–5.1)
Sodium: 134 mmol/L — ABNORMAL LOW (ref 135–145)

## 2021-10-15 LAB — CBC
HCT: 37.6 % — ABNORMAL LOW (ref 39.0–52.0)
Hemoglobin: 12.2 g/dL — ABNORMAL LOW (ref 13.0–17.0)
MCH: 28.9 pg (ref 26.0–34.0)
MCHC: 32.4 g/dL (ref 30.0–36.0)
MCV: 89.1 fL (ref 80.0–100.0)
Platelets: 341 10*3/uL (ref 150–400)
RBC: 4.22 MIL/uL (ref 4.22–5.81)
RDW: 15.2 % (ref 11.5–15.5)
WBC: 8.8 10*3/uL (ref 4.0–10.5)
nRBC: 0 % (ref 0.0–0.2)

## 2021-10-15 LAB — GLUCOSE, CAPILLARY
Glucose-Capillary: 123 mg/dL — ABNORMAL HIGH (ref 70–99)
Glucose-Capillary: 195 mg/dL — ABNORMAL HIGH (ref 70–99)
Glucose-Capillary: 200 mg/dL — ABNORMAL HIGH (ref 70–99)
Glucose-Capillary: 208 mg/dL — ABNORMAL HIGH (ref 70–99)
Glucose-Capillary: 216 mg/dL — ABNORMAL HIGH (ref 70–99)

## 2021-10-15 LAB — PROTIME-INR
INR: 2.6 — ABNORMAL HIGH (ref 0.8–1.2)
Prothrombin Time: 27.6 seconds — ABNORMAL HIGH (ref 11.4–15.2)

## 2021-10-15 MED ORDER — ACETAMINOPHEN 325 MG PO TABS: 650.0000 mg | ORAL_TABLET | ORAL | Status: AC | PRN

## 2021-10-15 MED ORDER — WARFARIN SODIUM 2.5 MG PO TABS
2.5000 mg | ORAL_TABLET | Freq: Every day | ORAL | Status: DC
  Administered 2021-10-15: 2.5 mg via ORAL
  Filled 2021-10-15: qty 1

## 2021-10-15 MED ORDER — DOCUSATE SODIUM 100 MG PO CAPS
200.0000 mg | ORAL_CAPSULE | Freq: Two times a day (BID) | ORAL | Status: DC
  Administered 2021-10-15 – 2021-10-18 (×7): 200 mg via ORAL
  Filled 2021-10-15 (×7): qty 2

## 2021-10-15 MED ORDER — INSULIN DETEMIR 100 UNIT/ML ~~LOC~~ SOLN
38.0000 [IU] | Freq: Two times a day (BID) | SUBCUTANEOUS | Status: DC
  Administered 2021-10-15 – 2021-10-18 (×7): 38 [IU] via SUBCUTANEOUS
  Filled 2021-10-15 (×8): qty 0.38

## 2021-10-15 MED ORDER — HYDRALAZINE HCL 10 MG PO TABS
10.0000 mg | ORAL_TABLET | Freq: Three times a day (TID) | ORAL | Status: DC
  Administered 2021-10-15 – 2021-10-17 (×8): 10 mg via ORAL
  Filled 2021-10-15 (×8): qty 1

## 2021-10-15 MED ORDER — AMIODARONE HCL 200 MG PO TABS
200.0000 mg | ORAL_TABLET | Freq: Two times a day (BID) | ORAL | Status: DC
  Administered 2021-10-15 – 2021-10-18 (×7): 200 mg via ORAL
  Filled 2021-10-15 (×6): qty 1

## 2021-10-15 MED ORDER — SORBITOL 70 % SOLN
30.0000 mL | Freq: Once | Status: AC
  Administered 2021-10-16: 30 mL via ORAL
  Filled 2021-10-15: qty 30

## 2021-10-15 MED ORDER — LISINOPRIL 20 MG PO TABS
40.0000 mg | ORAL_TABLET | Freq: Every day | ORAL | Status: DC
  Administered 2021-10-15 – 2021-10-18 (×4): 40 mg via ORAL
  Filled 2021-10-15 (×4): qty 2

## 2021-10-15 NOTE — Progress Notes (Signed)
Pt eager to exercise. Independently stood up with walker and took 2 sets of 10 marches in place. Returned to seated position, incisions clean dry and intact. VSS.   Chrisandra Carota, RN 10/15/2021 6:36 PM

## 2021-10-15 NOTE — Progress Notes (Addendum)
Pt experienced changes in vision "squiggle lines" and headache,EKG performed NSR. VSS and back to bed.   Chrisandra Carota, RN 10/15/2021 1:44 PM

## 2021-10-15 NOTE — Progress Notes (Addendum)
[address]       [place] [address]             [phone]        10 Days Post-Op Procedure(s) (LRB): CORONARY ARTERY BYPASS GRAFTING (CABG) X 1, ON PUMP, USING LEFT INTERNAL MAMMARY ARTERY CONDUIT (N/A) AORTIC VALVE REPLACEMENT (AVR) WITH INSPIRIS RESILIA AORTIC VALVE SIZE 27MM (N/A) TRANSESOPHAGEAL ECHOCARDIOGRAM (TEE) (N/A) APPLICATION OF CELL SAVER  Subjective: Patient still with [name] weakness. He states his use of incentive spirometer this am not as good as other days.  Objective: Vital signs in last 24 hours: Temp:  [97.5 F (36.4 C)-98.1 F (36.7 C)] 97.8 F (36.6 C) (01/08 0407) Pulse Rate:  [69-77] 74 (01/08 0407) Cardiac Rhythm: Normal sinus rhythm (01/07 2017) Resp:  [15-25] 18 (01/08 0407) BP: (135-155)/(55-76) 151/68 (01/08 0407) SpO2:  [93 %-97 %] 95 % (01/08 0407)  Pre op weight 133.1 kg Current Weight  10/12/21 134.6 kg      Intake/Output from previous day: 01/07 0701 - 01/08 0700 In: 720 [P.O.:720] Out: 2300 [Urine:2300]   Physical Exam:  Cardiovascular: RRR, no murmur Pulmonary: Clear to auscultation bilaterally Abdomen: Soft, non tender, bowel sounds present. Extremities: SCDs in place Wound: Clean and dry.  No erythema or signs of infection.  Lab Results: CBC: Recent Labs    10/15/21 0201  WBC 8.8  HGB 12.2*  HCT 37.6*  PLT 341   BMET:  Recent Labs    10/13/21 0655 10/15/21 0201  NA 134* 134*  K 4.6 4.5  CL 105 107  CO2 20* 21*  GLUCOSE 98 167*  BUN 44* 51*  CREATININE 2.28* 2.14*  CALCIUM 9.6 9.2     PT/INR:  Lab Results  Component Value Date   INR 2.6 (H) 10/15/2021   INR 2.1 (H) 10/14/2021   INR 1.8 (H) 10/13/2021   ABG:  INR: Will add last result for INR, ABG once components are confirmed Will add last 4 CBG results once components are confirmed  Assessment/Plan:  1. CV - Had post op a fib. Maintaining SR. Still hypertensive despite starting Hydralazine so will will increase it.  On Lopressor 50 mg bid, Lisinopril 20 mg bid, Amlodipine 10 mg daily, Coumadin, and Amiodarone 400 mg bid, Hydralazine 10 mg bid.  Will decrease Amiodarone to 200 mg bid. INR this am with slight increase to 2.6. Coumadin decreased to 2.5 mg daily.  2.  Pulmonary - On room air. He has OSA and uses bi pap at night. He states he is sleeping better as has oxygen in addition to bi pap;requesting if that can be arranged for home. Encourage incentive spirometer. 3. History of CKD (stage III)-Creatinine this am decreased to 2.14. Creatinine prior to surgery 2.3. He has had renal transplant 2018-continue Prednisone and Cellcept 4.  Expected post op acute blood loss anemia - H and H this am stable at 12.2 and 37.6 5. History of DM-CBGs 199/220/123. On Insulin but will increase for better glucose control. He has been on Prednisone long term for renal transplant. Pre op HGA1C 7.1 6. Neuro-Scattered embolic left brain strokes. He had some debris in his distal aortic arch and descending aorta on TEE. He has had [name] weakness. Continue PT/OT. Neurology following 7. History of hypothyroidism-continue Levothyroxine 100 mcg daily 8. Will schedule colace bid to help with bowel movement;Lactulose "too strong" 9. Will need SNF at discharge  [place] 10/15/2021,8:52 AM   Agree with above. Dispo planning.  Robert [name]

## 2021-10-16 LAB — CBC
HCT: 38 % — ABNORMAL LOW (ref 39.0–52.0)
Hemoglobin: 11.9 g/dL — ABNORMAL LOW (ref 13.0–17.0)
MCH: 28.3 pg (ref 26.0–34.0)
MCHC: 31.3 g/dL (ref 30.0–36.0)
MCV: 90.5 fL (ref 80.0–100.0)
Platelets: 377 10*3/uL (ref 150–400)
RBC: 4.2 MIL/uL — ABNORMAL LOW (ref 4.22–5.81)
RDW: 15 % (ref 11.5–15.5)
WBC: 10 10*3/uL (ref 4.0–10.5)
nRBC: 0 % (ref 0.0–0.2)

## 2021-10-16 LAB — GLUCOSE, CAPILLARY
Glucose-Capillary: 101 mg/dL — ABNORMAL HIGH (ref 70–99)
Glucose-Capillary: 145 mg/dL — ABNORMAL HIGH (ref 70–99)
Glucose-Capillary: 213 mg/dL — ABNORMAL HIGH (ref 70–99)

## 2021-10-16 LAB — PROTIME-INR
INR: 2.8 — ABNORMAL HIGH (ref 0.8–1.2)
Prothrombin Time: 29.8 seconds — ABNORMAL HIGH (ref 11.4–15.2)

## 2021-10-16 MED ORDER — LACTULOSE 10 GM/15ML PO SOLN
30.0000 g | Freq: Every day | ORAL | Status: DC | PRN
  Administered 2021-10-16: 30 g via ORAL
  Filled 2021-10-16: qty 45

## 2021-10-16 MED ORDER — WARFARIN SODIUM 2 MG PO TABS
2.0000 mg | ORAL_TABLET | Freq: Every day | ORAL | Status: DC
  Administered 2021-10-16 – 2021-10-17 (×2): 2 mg via ORAL
  Filled 2021-10-16 (×2): qty 1

## 2021-10-16 NOTE — Progress Notes (Addendum)
[address]       [place], [address]             [phone]      11 Days Post-Op Procedure(s) (LRB): CORONARY ARTERY BYPASS GRAFTING (CABG) X 1, ON PUMP, USING LEFT INTERNAL MAMMARY ARTERY CONDUIT (N/A) AORTIC VALVE REPLACEMENT (AVR) WITH INSPIRIS RESILIA AORTIC VALVE SIZE 27MM (N/A) TRANSESOPHAGEAL ECHOCARDIOGRAM (TEE) (N/A) APPLICATION OF CELL SAVER Subjective: C/o constipation  Objective: Vital signs in last 24 hours: Temp:  [97.6 F (36.4 C)-98.9 F (37.2 C)] 98.2 F (36.8 C) (01/09 0400) Pulse Rate:  [69-82] 73 (01/09 0400) Cardiac Rhythm: Normal sinus rhythm (01/08 1900) Resp:  [17-31] 18 (01/09 0400) BP: (123-165)/(61-80) 135/72 (01/09 0400) SpO2:  [91 %-97 %] 95 % (01/09 0400)  Hemodynamic parameters for last 24 hours:    Intake/Output from previous day: 01/08 0701 - 01/09 0700 In: 477 [P.O.:477] Out: 2425 [Urine:2425] Intake/Output this shift: No intake/output data recorded.  General appearance: alert, cooperative, and no distress Heart: regular rate and rhythm Lungs: min dim in bases Abdomen: + BS, obese, no tender Extremities: min edema Wound: incis healing well  Lab Results: Recent Labs    10/15/21 0201 10/16/21 0228  WBC 8.8 10.0  HGB 12.2* 11.9*  HCT 37.6* 38.0*  PLT 341 377   BMET:  Recent Labs    10/15/21 0201  NA 134*  K 4.5  CL 107  CO2 21*  GLUCOSE 167*  BUN 51*  CREATININE 2.14*  CALCIUM 9.2    PT/INR:  Recent Labs    10/16/21 0228  LABPROT 29.8*  INR 2.8*   ABG    Component Value Date/Time   PHART 7.407 10/06/2021 1053   HCO3 18.0 (L) 10/06/2021 1053   TCO2 24 10/09/2021 1651   ACIDBASEDEF 6.0 (H) 10/06/2021 1053   O2SAT 73.2 10/10/2021 0526   CBG (last 3)  Recent Labs    10/15/21 1628 10/15/21 2109 10/16/21 0609  GLUCAP 208* 216* 101*    Meds Scheduled Meds:  ALPRAZolam  0.25 mg Oral BID   amiodarone  200 mg Oral BID   amLODipine  10 mg Oral Daily   atorvastatin  80 mg Oral  Daily   docusate sodium  200 mg Oral BID   DULoxetine  60 mg Oral Daily   gabapentin  200 mg Oral Q0600   gabapentin  300 mg Oral QHS   hydrALAZINE  10 mg Oral TID   insulin aspart  0-9 Units Subcutaneous TID WC   insulin detemir  38 Units Subcutaneous BID   levothyroxine  100 mcg Oral Daily   lisinopril  40 mg Oral Daily   mouth rinse  15 mL Mouth Rinse BID   metoprolol tartrate  50 mg Oral BID   mycophenolate  1,000 mg Oral Q12H   pantoprazole  40 mg Oral QAC breakfast   predniSONE  5 mg Oral Q breakfast   sodium chloride flush  3 mL Intravenous Q12H   tamsulosin  0.8 mg Oral QHS   warfarin  2.5 mg Oral q1600   Warfarin - Physician Dosing Inpatient   Does not apply q1600   Continuous Infusions:  sodium chloride     PRN Meds:.sodium chloride, acetaminophen, ondansetron (ZOFRAN) IV, oxyCODONE, sodium chloride flush, traMADol, traZODone, zolpidem  Xrays No results found.  Assessment/Plan: S/P Procedure(s) (LRB): CORONARY ARTERY BYPASS GRAFTING (CABG) X 1, ON PUMP, USING LEFT INTERNAL MAMMARY ARTERY CONDUIT (N/A) AORTIC VALVE REPLACEMENT (AVR) WITH INSPIRIS RESILIA AORTIC  VALVE SIZE 27MM (N/A) TRANSESOPHAGEAL ECHOCARDIOGRAM (TEE) (N/A) APPLICATION OF CELL SAVER POD#11  1 afeb, VSS s BP 120's-160's, sinus rhythm 2 sats good on RA 3 good UOP 4 creat 2.14- better than preop, conts current management for transplant 5 conts rehab modalities post CVA 6 CBG's some readings over 200 follow-insulin  dose adjusted yesterday of  7 INR 2.8- reduce to 2 mg coumadin 8 TOC working on SNF bed  9 needs more aggressive PT/OT rehab 10 got sorbitol for constipation     LOS: 11 days    John Giovanni PA-C Pager [phone] 10/16/2021    Chart reviewed, patient examined, agree with above. He says that he walked to the door and bathroom and right leg strength is improving. Awaiting placement in SNF.

## 2021-10-16 NOTE — Progress Notes (Signed)
Patient resting comfortably on home BIPAP. 21% Fio2.

## 2021-10-16 NOTE — Progress Notes (Signed)
Mobility Specialist: Progress Note   10/16/21 1221  Mobility  Activity Ambulated in room  Level of Assistance Moderate assist, patient does 50-74%  Assistive Device Front wheel walker  Distance Ambulated (ft) 24 ft  Mobility Ambulated with assistance in room  Mobility Response Tolerated well  Mobility performed by Mobility specialist  $Mobility charge 1 Mobility   Pre-Mobility: 79 HR During Mobility: 82 HR, 171/79 BP, 96% SpO2 Post-Mobility: 94 HR, 180/87 BP, 95% SpO2  Pt required minA to sit EOB and modA to stand with bed elevation. Pt with slow gait and DOE but able to ambulate to BR door and back to the bed. Minimal chest pain pt rated 1-2/10 after walk. Pt back to bed after walk with call bell and phone in reach.   [facility] Kethan Papadopoulos Mobility Specialist Mobility Specialist 4 [place]: [phone] Mobility Specialist 2 [place] and [place]: [phone]

## 2021-10-16 NOTE — Progress Notes (Signed)
Physical Therapy Treatment Patient Details Name: Robert [name] MRN: [id number] DOB: 1961/03/29 Today's Date: 10/16/2021   History of Present Illness 61 yo male s/p CABG 12/29 extubated 12/30. Pt with post op rt weakness LE>UE when evaluated by PT/OT. CT negative. MRI completed after pacing wires removed on 1/3. MRI showed acute to subacute scattered small infarcts in  multiple vascular territories (left cerebellum, left MCA and  bilateral PCA) raising the possibility of a recent embolic event, as well as chronic occlusion of R vertebral artery. PMH CAD obese DM2 ESRD s/p renal tx kidney transplant in 2018 now with CKD3 HTN    PT Comments    Pt making excellent progress as rt sided weakness improving. Remains very motivated to mobilize and work toward return to independence.    Recommendations for follow up therapy are one component of a multi-disciplinary discharge planning process, led by the attending physician.  Recommendations may be updated based on patient status, additional functional criteria and insurance authorization.  Follow Up Recommendations  Skilled nursing-short term rehab (<3 hours/day)     Assistance Recommended at Discharge Frequent or constant Supervision/Assistance  Patient can return home with the following Two people to help with walking and/or transfers;Help with stairs or ramp for entrance   Equipment Recommendations  Rolling walker (2 wheels);Wheelchair (measurements PT);Wheelchair cushion (measurements PT);BSC/3in1    Recommendations for Other Services       Precautions / Restrictions Precautions Precautions: Fall;Sternal     Mobility  Bed Mobility Overal bed mobility: Needs Assistance Bed Mobility: Rolling;Sidelying to Sit;Sit to Sidelying Rolling: Mod assist Sidelying to sit: Mod assist;HOB elevated     Sit to sidelying: Min assist General bed mobility comments: Assist to bring hips and shoulders over rolling without using UE's to pull/push.  Assist to elevate trunk into sitting. Assist to bring legs back into bed returning to sidelying    Transfers Overall transfer level: Needs assistance Equipment used: Rolling walker (2 wheels) Transfers: Sit to/from Stand;Bed to chair/wheelchair/BSC Sit to Stand: Min assist;Mod assist;From elevated surface     Step pivot transfers: Min assist;+2 safety/equipment     General transfer comment: Min assist to bring hips up from mildly elevated bed. Mod assist to bring hips up from lower chait. Pt using hands to knees and rocking momentum to facilitate coming to stand. Chair to bed with pivotal steps and walker    Ambulation/Gait Ambulation/Gait assistance: Min assist;+2 safety/equipment Gait Distance (Feet): 25 Feet Assistive device: Rolling walker (2 wheels) Gait Pattern/deviations: Step-through pattern;Decreased dorsiflexion - right;Decreased step length - left;Decreased stance time - right Gait velocity: decr Gait velocity interpretation: <1.31 ft/sec, indicative of household ambulator   General Gait Details: Assist for balance and support. Pt with heavy support on UE's. Pt using trunk and momentum to facilitate swing of [name] and decr control of placement of rt foot. Circumduction of [name] with foot ending in narrower stance position. As pt fatigued clearance of rt foot decreased and at times sliding along floor instead of lifting off of floor. Chair follow to incr amb distance.   Stairs             Wheelchair Mobility    Modified Rankin (Stroke Patients Only)       Balance Overall balance assessment: Needs assistance Sitting-balance support: Feet supported;No upper extremity supported Sitting balance-Leahy Scale: Fair     Standing balance support: Bilateral upper extremity supported;Reliant on assistive device for balance Standing balance-Leahy Scale: Poor Standing balance comment: Walker and min assist for static  standing                             Cognition Arousal/Alertness: Awake/alert Behavior During Therapy: WFL for tasks assessed/performed;Anxious Overall Cognitive Status: Within Functional Limits for tasks assessed                                          Exercises      General Comments General comments (skin integrity, edema, etc.): VSS on RA      Pertinent Vitals/Pain Pain Assessment: No/denies pain    Home Living                          Prior Function            PT Goals (current goals can now be found in the care plan section) Acute Rehab PT Goals PT Goal Formulation: With patient Time For Goal Achievement: 10/30/21 Potential to Achieve Goals: Good Progress towards PT goals: Goals met and updated - see care plan    Frequency    Min 3X/week      PT Plan Current plan remains appropriate    Co-evaluation              AM-PAC PT "6 Clicks" Mobility   Outcome Measure  Help needed turning from your back to your side while in a flat bed without using bedrails?: A Lot Help needed moving from lying on your back to sitting on the side of a flat bed without using bedrails?: A Lot Help needed moving to and from a bed to a chair (including a wheelchair)?: A Lot Help needed standing up from a chair using your arms (e.g., wheelchair or bedside chair)?: A Lot Help needed to walk in hospital room?: Total Help needed climbing 3-5 steps with a railing? : Total 6 Click Score: 10    End of Session Equipment Utilized During Treatment: Gait belt Activity Tolerance: Patient tolerated treatment well Patient left: in bed;with call bell/phone within reach;with bed alarm set Nurse Communication: Mobility status PT Visit Diagnosis: Other abnormalities of gait and mobility (R26.89);Other symptoms and signs involving the nervous system (R29.898)     Time: 3546-5681 PT Time Calculation (min) (ACUTE ONLY): 22 min  Charges:  $Gait Training: 8-22 mins                     [place] Pager [phone] Office [place] 10/16/2021, 5:42 PM

## 2021-10-17 ENCOUNTER — Other Ambulatory Visit: Payer: 59

## 2021-10-17 ENCOUNTER — Telehealth: Payer: Self-pay

## 2021-10-17 LAB — GLUCOSE, CAPILLARY
Glucose-Capillary: 108 mg/dL — ABNORMAL HIGH (ref 70–99)
Glucose-Capillary: 142 mg/dL — ABNORMAL HIGH (ref 70–99)
Glucose-Capillary: 188 mg/dL — ABNORMAL HIGH (ref 70–99)
Glucose-Capillary: 185 mg/dL — ABNORMAL HIGH (ref 70–99)

## 2021-10-17 LAB — PROTIME-INR
INR: 2.3 — ABNORMAL HIGH (ref 0.8–1.2)
Prothrombin Time: 25.6 seconds — ABNORMAL HIGH (ref 11.4–15.2)

## 2021-10-17 LAB — CBC
HCT: 42.5 % (ref 39.0–52.0)
Hemoglobin: 13.5 g/dL (ref 13.0–17.0)
MCH: 28.4 pg (ref 26.0–34.0)
MCHC: 31.8 g/dL (ref 30.0–36.0)
MCV: 89.3 fL (ref 80.0–100.0)
Platelets: 409 10*3/uL — ABNORMAL HIGH (ref 150–400)
RBC: 4.76 MIL/uL (ref 4.22–5.81)
RDW: 15 % (ref 11.5–15.5)
WBC: 9.4 10*3/uL (ref 4.0–10.5)
nRBC: 0 % (ref 0.0–0.2)

## 2021-10-17 MED ORDER — WARFARIN SODIUM 2.5 MG PO TABS
2.5000 mg | ORAL_TABLET | Freq: Every day | ORAL | Status: DC
  Administered 2021-10-18: 2.5 mg via ORAL
  Filled 2021-10-17: qty 1

## 2021-10-17 NOTE — Progress Notes (Signed)
Physical Therapy Treatment Patient Details Name: Robert [name] MRN: [id number] DOB: 1961-04-16 Today's Date: 10/17/2021   History of Present Illness 61 yo male s/p CABG 12/29 extubated 12/30. Pt with post op rt weakness LE>UE when evaluated by PT/OT. CT negative. MRI completed after pacing wires removed on 1/3. MRI showed acute to subacute scattered small infarcts in  multiple vascular territories (left cerebellum, left MCA and  bilateral PCA) raising the possibility of a recent embolic event, as well as chronic occlusion of R vertebral artery. PMH CAD obese DM2 ESRD s/p renal tx kidney transplant in 2018 now with CKD3 HTN    PT Comments    Pt making steady progress with mobility. Needs continued work with control of [name] with swing and stance phase of amb. Pt remains motivated to work toward independence.    Recommendations for follow up therapy are one component of a multi-disciplinary discharge planning process, led by the attending physician.  Recommendations may be updated based on patient status, additional functional criteria and insurance authorization.  Follow Up Recommendations  Skilled nursing-short term rehab (<3 hours/day)     Assistance Recommended at Discharge Frequent or constant Supervision/Assistance  Patient can return home with the following Two people to help with walking and/or transfers;Help with stairs or ramp for entrance   Equipment Recommendations  Rolling walker (2 wheels);Wheelchair (measurements PT);Wheelchair cushion (measurements PT);BSC/3in1    Recommendations for Other Services       Precautions / Restrictions Precautions Precautions: Fall;Sternal     Mobility  Bed Mobility               General bed mobility comments: Pt up in chair    Transfers Overall transfer level: Needs assistance Equipment used: Rolling walker (2 wheels) Transfers: Sit to/from Stand Sit to Stand: Mod assist           General transfer comment: Assist to  bring hips up from low chair.    Ambulation/Gait Ambulation/Gait assistance: Min assist;+2 safety/equipment Gait Distance (Feet): 100 Feet Assistive device: Rolling walker (2 wheels) Gait Pattern/deviations: Step-through pattern;Decreased dorsiflexion - right;Decreased step length - left;Decreased stance time - right;Knee flexed in stance - right Gait velocity: decr Gait velocity interpretation: <1.31 ft/sec, indicative of household ambulator   General Gait Details: Assist for balance and support. Pt with heavy support on UE's. Pt using trunk and momentum to facilitate swing of [name] and decr control of placement of rt foot. Circumduction of [name] with foot ending in narrower stance position. As pt fatigued clearance of rt foot decreased and at times sliding along floor instead of lifting off of floor. Chair follow to incr amb distance.   Stairs             Wheelchair Mobility    Modified Rankin (Stroke Patients Only)       Balance Overall balance assessment: Needs assistance Sitting-balance support: Feet supported;No upper extremity supported Sitting balance-Leahy Scale: Fair     Standing balance support: Bilateral upper extremity supported;Reliant on assistive device for balance   Standing balance comment: Walker and min guard assist for static standing                            Cognition Arousal/Alertness: Awake/alert Behavior During Therapy: WFL for tasks assessed/performed;Anxious Overall Cognitive Status: Within Functional Limits for tasks assessed  Exercises      General Comments        Pertinent Vitals/Pain Pain Assessment: No/denies pain    Home Living                          Prior Function            PT Goals (current goals can now be found in the care plan section) Progress towards PT goals: Progressing toward goals    Frequency    Min 3X/week      PT Plan  Current plan remains appropriate    Co-evaluation              AM-PAC PT "6 Clicks" Mobility   Outcome Measure  Help needed turning from your back to your side while in a flat bed without using bedrails?: A Lot Help needed moving from lying on your back to sitting on the side of a flat bed without using bedrails?: A Lot Help needed moving to and from a bed to a chair (including a wheelchair)?: A Lot Help needed standing up from a chair using your arms (e.g., wheelchair or bedside chair)?: A Lot Help needed to walk in hospital room?: A Little Help needed climbing 3-5 steps with a railing? : Total 6 Click Score: 12    End of Session Equipment Utilized During Treatment: Gait belt Activity Tolerance: Patient tolerated treatment well Patient left: in chair;with call bell/phone within reach   PT Visit Diagnosis: Other abnormalities of gait and mobility (R26.89);Other symptoms and signs involving the nervous system (R29.898)     Time: 2956-2130 PT Time Calculation (min) (ACUTE ONLY): 17 min  Charges:  $Gait Training: 8-22 mins                     [place] Pager [phone] Office [place] 10/17/2021, 2:26 PM

## 2021-10-17 NOTE — Progress Notes (Addendum)
[address]       [place] [address]             [phone]      12 Days Post-Op Procedure(s) (LRB): CORONARY ARTERY BYPASS GRAFTING (CABG) X 1, ON PUMP, USING LEFT INTERNAL MAMMARY ARTERY CONDUIT (N/A) AORTIC VALVE REPLACEMENT (AVR) WITH INSPIRIS RESILIA AORTIC VALVE SIZE 27MM (N/A) TRANSESOPHAGEAL ECHOCARDIOGRAM (TEE) (N/A) APPLICATION OF CELL SAVER Subjective: Steady overall improvement   Objective: Vital signs in last 24 hours: Temp:  [97.3 F (36.3 C)-98.7 F (37.1 C)] 97.3 F (36.3 C) (01/10 0555) Pulse Rate:  [60-79] 60 (01/10 0555) Cardiac Rhythm: Normal sinus rhythm (01/09 2200) Resp:  [15-20] 20 (01/10 0555) BP: (118-180)/(46-87) 118/46 (01/10 0555) SpO2:  [93 %-99 %] 99 % (01/10 0555) Weight:  [132.5 kg] 132.5 kg (01/10 0620)  Hemodynamic parameters for last 24 hours:    Intake/Output from previous day: 01/09 0701 - 01/10 0700 In: 560 [P.O.:560] Out: 950 [Urine:950] Intake/Output this shift: No intake/output data recorded.  General appearance: alert, cooperative, and no distress Neurologic: [name] slowly improving strength/mobility Heart: regular rate and rhythm Lungs: dim in bases Abdomen: hyperactive BS, soft, non tender, obese Extremities: min edema Wound: incis healing well  Lab Results: Recent Labs    10/16/21 0228 10/17/21 0341  WBC 10.0 9.4  HGB 11.9* 13.5  HCT 38.0* 42.5  PLT 377 409*   BMET:  Recent Labs    10/15/21 0201  NA 134*  K 4.5  CL 107  CO2 21*  GLUCOSE 167*  BUN 51*  CREATININE 2.14*  CALCIUM 9.2    PT/INR:  Recent Labs    10/17/21 0341  LABPROT 25.6*  INR 2.3*   ABG    Component Value Date/Time   PHART 7.407 10/06/2021 1053   HCO3 18.0 (L) 10/06/2021 1053   TCO2 24 10/09/2021 1651   ACIDBASEDEF 6.0 (H) 10/06/2021 1053   O2SAT 73.2 10/10/2021 0526   CBG (last 3)  Recent Labs    10/16/21 1225 10/16/21 2200 10/17/21 0617  GLUCAP 145* 213* 108*    Meds Scheduled Meds:   ALPRAZolam  0.25 mg Oral BID   amiodarone  200 mg Oral BID   amLODipine  10 mg Oral Daily   atorvastatin  80 mg Oral Daily   docusate sodium  200 mg Oral BID   DULoxetine  60 mg Oral Daily   gabapentin  200 mg Oral Q0600   gabapentin  300 mg Oral QHS   hydrALAZINE  10 mg Oral TID   insulin aspart  0-9 Units Subcutaneous TID WC   insulin detemir  38 Units Subcutaneous BID   levothyroxine  100 mcg Oral Daily   lisinopril  40 mg Oral Daily   mouth rinse  15 mL Mouth Rinse BID   metoprolol tartrate  50 mg Oral BID   mycophenolate  1,000 mg Oral Q12H   pantoprazole  40 mg Oral QAC breakfast   predniSONE  5 mg Oral Q breakfast   sodium chloride flush  3 mL Intravenous Q12H   tamsulosin  0.8 mg Oral QHS   warfarin  2 mg Oral q1600   Warfarin - Physician Dosing Inpatient   Does not apply q1600   Continuous Infusions:  sodium chloride     PRN Meds:.sodium chloride, acetaminophen, lactulose, ondansetron (ZOFRAN) IV, oxyCODONE, sodium chloride flush, traMADol, traZODone, zolpidem  Xrays No results found.  Assessment/Plan: S/P Procedure(s) (LRB): CORONARY ARTERY BYPASS GRAFTING (CABG) X 1, ON  PUMP, USING LEFT INTERNAL MAMMARY ARTERY CONDUIT (N/A) AORTIC VALVE REPLACEMENT (AVR) WITH INSPIRIS RESILIA AORTIC VALVE SIZE 27MM (N/A) TRANSESOPHAGEAL ECHOCARDIOGRAM (TEE) (N/A) APPLICATION OF CELL SAVER POD#12   1 afeb, S BP 118-180 range, mostly on higher range, sinus rhythm- may need to increase hydralazine in next day or so 2 sats good on RA, CPAP at night 3 good UOP, not all measured 4 INR 2.3, will go back to 2.5 of coumadin 5 not anemic 6 conts rehab modalities  7 awaits SNF bed 8 BS 101-213 range yesterday- was on pump at home, transition at discharge home , cont same management  for now 9 improving with rehab but limited- push as able      LOS: 12 days    John Giovanni PA-C Pager [phone] 10/17/2021    Chart reviewed, patient examined, agree with above. He feels  better every day and says he walked 100 ft today. He thinks right leg strength is improving. He has been in SNF/Rehab and may go later today or tomorrow. INR 2.3. Agree he needs Coumadin 2.5 mg daily. He only got 2 mg today.

## 2021-10-17 NOTE — Telephone Encounter (Signed)
FMLA/STD forms completed and faxed to [facility] [employer] @ [phone] and [phone]. LOA 12/29.22 through approx 01/01/22.

## 2021-10-17 NOTE — Progress Notes (Signed)
Pt currently on his home CPAP tolerating well.

## 2021-10-17 NOTE — Progress Notes (Signed)
Mobility Specialist: Progress Note   10/17/21 1752  Mobility  Activity Refused mobility   Pt refused mobility d/t chest pain he rated 4/10. Pt requesting pain medication, RN notified.   [facility] [place] Bertha Lokken Mobility Specialist Mobility Specialist 4 [place]: [phone] Mobility Specialist 2 [place] and [place]: [phone]

## 2021-10-17 NOTE — Progress Notes (Signed)
Occupational Therapy Treatment Patient Details Name: Robert [name] MRN: [id number] DOB: April 17, 1961 Today's Date: 10/17/2021   History of present illness 61 yo male s/p CABG 12/29 extubated 12/30. Pt with post op rt weakness LE>UE when evaluated by PT/OT. CT negative. MRI completed after pacing wires removed on 1/3. MRI showed acute to subacute scattered small infarcts in  multiple vascular territories (left cerebellum, left MCA and  bilateral PCA) raising the possibility of a recent embolic event, as well as chronic occlusion of R vertebral artery. PMH CAD obese DM2 ESRD s/p renal tx kidney transplant in 2018 now with CKD3 HTN   OT comments  Pt making excellent progress towards OT goals. Session focused on standing balance during ADLs, as well as compensatory strategies with LB dressing tasks with emphasis on maintaining sternal precautions. Pt able to complete these ADLs with Min A at most today. Pt continues to benefit from hands on assist and tactile cues to facilitate R knee extension in standing to decrease fall risk. Continue to recommend ST rehab prior to DC home.   SpO2 94% on RA, 2/4 DOE, HR 70s-80s   Recommendations for follow up therapy are one component of a multi-disciplinary discharge planning process, led by the attending physician.  Recommendations may be updated based on patient status, additional functional criteria and insurance authorization.    Follow Up Recommendations  Skilled nursing-short term rehab (<3 hours/day)    Assistance Recommended at Discharge Intermittent Supervision/Assistance  Patient can return home with the following  A lot of help with bathing/dressing/bathroom;Help with stairs or ramp for entrance   Equipment Recommendations  BSC/3in1    Recommendations for Other Services      Precautions / Restrictions Precautions Precautions: Fall;Sternal Restrictions Weight Bearing Restrictions: No       Mobility Bed Mobility Overal bed mobility:  Needs Assistance Bed Mobility: Sit to Supine       Sit to supine: Min assist   General bed mobility comments: light MIn A to get R LE back into bed    Transfers Overall transfer level: Needs assistance Equipment used: Rolling walker (2 wheels) Transfers: Sit to/from Stand Sit to Stand: Min assist           General transfer comment: varied Min A progressing to min guard with trials from recliner, good recall of precautions - light assist to correct posterior lean     Balance Overall balance assessment: Needs assistance Sitting-balance support: Feet supported;No upper extremity supported Sitting balance-Leahy Scale: Good     Standing balance support: No upper extremity supported;Bilateral upper extremity supported;During functional activity Standing balance-Leahy Scale: Poor Standing balance comment: reliant on at least one UE support in standing                           ADL either performed or assessed with clinical judgement   ADL Overall ADL's : Needs assistance/impaired     Grooming: Min guard;Standing;Oral care Grooming Details (indicate cue type and reason): use of compensatory strategies to complete task with L dominant UE while holding to RW with R UE. tactile cues to correct R knee flexion             Lower Body Dressing: Minimal assistance;Sit to/from stand;Sitting/lateral leans Lower Body Dressing Details (indicate cue type and reason): guided pt in LB dressing tasks to don paper scrub pants around feet (bending to feet with increased time, cueing for breathing and to don affected LE first), donning socks  with figure four position with light assist to pull LEs to self (emphasis to avoid breaking lifting restrictions) and donning pants around waist with gait belt to simulate with alternating one hand assist               General ADL Comments: Pt progressing well towards goals with emphasis on problem solving AE, compensatory strategies for LB  ADLs, correcting posture with standing ADLs and balance improvements    Extremity/Trunk Assessment Upper Extremity Assessment Upper Extremity Assessment: Overall WFL for tasks assessed RUE Deficits / Details: improving coordination/strength, edema at baseline   Lower Extremity Assessment Lower Extremity Assessment: Defer to PT evaluation        Vision   Vision Assessment?: No apparent visual deficits   Perception     Praxis      Cognition Arousal/Alertness: Awake/alert Behavior During Therapy: WFL for tasks assessed/performed;Anxious Overall Cognitive Status: Within Functional Limits for tasks assessed                                            Exercises     Shoulder Instructions       General Comments SpO2 94% on RA though 2/4 DOE, HR 70-80s    Pertinent Vitals/ Pain       Pain Assessment: No/denies pain  Home Living                                          Prior Functioning/Environment              Frequency  Min 2X/week        Progress Toward Goals  OT Goals(current goals can now be found in the care plan section)  Progress towards OT goals: Progressing toward goals  Acute Rehab OT Goals Patient Stated Goal: regain independence OT Goal Formulation: With patient Time For Goal Achievement: 10/21/21 Potential to Achieve Goals: Good ADL Goals Pt Will Perform Grooming: with set-up;sitting Additional ADL Goal #1: pt will complete bed mobility mod (A) with HOB 35 degrees as precursor to adls. Additional ADL Goal #2: pt will verbalize sternal precautsions mod I without cues  Plan Discharge plan remains appropriate    Co-evaluation                 AM-PAC OT "6 Clicks" Daily Activity     Outcome Measure   Help from another person eating meals?: A Little Help from another person taking care of personal grooming?: A Little Help from another person toileting, which includes using toliet, bedpan, or urinal?:  A Lot Help from another person bathing (including washing, rinsing, drying)?: A Lot Help from another person to put on and taking off regular upper body clothing?: A Little Help from another person to put on and taking off regular lower body clothing?: A Little 6 Click Score: 16    End of Session Equipment Utilized During Treatment: Gait belt;Rolling walker (2 wheels)  OT Visit Diagnosis: Unsteadiness on feet (R26.81);Muscle weakness (generalized) (M62.81)   Activity Tolerance Patient tolerated treatment well   Patient Left in bed;with call bell/phone within reach   Nurse Communication Mobility status        Time: 1356-1440 OT Time Calculation (min): 44 min  Charges: OT General Charges $OT Visit: 1 Visit OT Treatments $Self  Care/Home Management : 23-37 mins $Therapeutic Activity: 8-22 mins  Malachy Chamber, OTR/L Acute Rehab Services Office: [phone]   Layla Maw 10/17/2021, 2:52 PM

## 2021-10-17 NOTE — TOC Progression Note (Signed)
Transition of Care [facility]) - Progression Note    Patient Details  Name: Robert [name] MRN: [id number] Date of Birth: 11-Jan-1961  Transition of Care [facility]) CM/SW [place], [place] Phone Number: 10/17/2021, 4:41 PM  Clinical Narrative:     Patient and spouse accepted bed offer with [facility] care- SNF swill start insurance today.   TOC will continue to follow and assist with discharge planning.  Thurmond Butts, MSW, LCSW Clinical Social Worker    Expected Discharge Plan: Skilled Nursing Facility Barriers to Discharge: Continued Medical Work up  Expected Discharge Plan and Services Expected Discharge Plan: [place] In-house Referral: Clinical Social Work     Living arrangements for the past 2 months: Single Family Home                                       Social Determinants of Health (SDOH) Interventions    Readmission Risk Interventions No flowsheet data found.

## 2021-10-18 LAB — RESP PANEL BY RT-PCR (FLU A&B, COVID) ARPGX2
Influenza A by PCR: NEGATIVE
Influenza B by PCR: NEGATIVE
SARS Coronavirus 2 by RT PCR: NEGATIVE

## 2021-10-18 LAB — CBC
HCT: 40.1 % (ref 39.0–52.0)
Hemoglobin: 12.4 g/dL — ABNORMAL LOW (ref 13.0–17.0)
MCH: 28.1 pg (ref 26.0–34.0)
MCHC: 30.9 g/dL (ref 30.0–36.0)
MCV: 90.7 fL (ref 80.0–100.0)
Platelets: 408 10*3/uL — ABNORMAL HIGH (ref 150–400)
RBC: 4.42 MIL/uL (ref 4.22–5.81)
RDW: 15.1 % (ref 11.5–15.5)
WBC: 9.1 10*3/uL (ref 4.0–10.5)
nRBC: 0 % (ref 0.0–0.2)

## 2021-10-18 LAB — PROTIME-INR
INR: 1.9 — ABNORMAL HIGH (ref 0.8–1.2)
Prothrombin Time: 21.9 seconds — ABNORMAL HIGH (ref 11.4–15.2)

## 2021-10-18 LAB — GLUCOSE, CAPILLARY
Glucose-Capillary: 101 mg/dL — ABNORMAL HIGH (ref 70–99)
Glucose-Capillary: 175 mg/dL — ABNORMAL HIGH (ref 70–99)
Glucose-Capillary: 154 mg/dL — ABNORMAL HIGH (ref 70–99)

## 2021-10-18 MED ORDER — INSULIN ASPART 100 UNIT/ML IJ SOLN: 0.0000 [IU] | Freq: Three times a day (TID) | INTRAMUSCULAR | 11 refills | Status: DC

## 2021-10-18 MED ORDER — LISINOPRIL 40 MG PO TABS: 40.0000 mg | ORAL_TABLET | Freq: Every day | ORAL | Status: DC

## 2021-10-18 MED ORDER — HYDRALAZINE HCL 25 MG PO TABS: 25.0000 mg | ORAL_TABLET | Freq: Three times a day (TID) | ORAL | Status: DC

## 2021-10-18 MED ORDER — ATORVASTATIN CALCIUM 80 MG PO TABS: 80.0000 mg | ORAL_TABLET | Freq: Every day | ORAL | Status: DC

## 2021-10-18 MED ORDER — HYDRALAZINE HCL 25 MG PO TABS
25.0000 mg | ORAL_TABLET | Freq: Three times a day (TID) | ORAL | Status: DC
  Administered 2021-10-18 (×2): 25 mg via ORAL
  Filled 2021-10-18 (×2): qty 1

## 2021-10-18 MED ORDER — OXYCODONE HCL 5 MG PO TABS: 5.0000 mg | ORAL_TABLET | Freq: Four times a day (QID) | ORAL | 0 refills | Status: DC | PRN

## 2021-10-18 MED ORDER — WARFARIN SODIUM 2.5 MG PO TABS: 2.5000 mg | ORAL_TABLET | Freq: Every day | ORAL | Status: DC

## 2021-10-18 MED ORDER — AMIODARONE HCL 200 MG PO TABS: 200.0000 mg | ORAL_TABLET | Freq: Two times a day (BID) | ORAL | Status: DC

## 2021-10-18 MED ORDER — INSULIN DETEMIR 100 UNIT/ML ~~LOC~~ SOLN: 38.0000 [IU] | Freq: Two times a day (BID) | SUBCUTANEOUS | 11 refills | Status: DC

## 2021-10-18 NOTE — Progress Notes (Signed)
DIscharge to [facility] via [place].

## 2021-10-18 NOTE — Progress Notes (Addendum)
[address]       [place] [address]             [phone]      13 Days Post-Op Procedure(s) (LRB): CORONARY ARTERY BYPASS GRAFTING (CABG) X 1, ON PUMP, USING LEFT INTERNAL MAMMARY ARTERY CONDUIT (N/A) AORTIC VALVE REPLACEMENT (AVR) WITH INSPIRIS RESILIA AORTIC VALVE SIZE 27MM (N/A) TRANSESOPHAGEAL ECHOCARDIOGRAM (TEE) (N/A) APPLICATION OF CELL SAVER Subjective: Conts to feel a bit stronger each day  Objective: Vital signs in last 24 hours: Temp:  [97.6 F (36.4 C)-98.6 F (37 C)] 97.6 F (36.4 C) (01/11 0346) Pulse Rate:  [61-76] 61 (01/11 0346) Cardiac Rhythm: Normal sinus rhythm (01/10 2100) Resp:  [18-24] 18 (01/11 0346) BP: (119-164)/(58-76) 119/58 (01/11 0346) SpO2:  [91 %-98 %] 94 % (01/11 0346) Weight:  [130.5 kg] 130.5 kg (01/11 0553)  Hemodynamic parameters for last 24 hours:    Intake/Output from previous day: 01/10 0701 - 01/11 0700 In: 700 [P.O.:700] Out: 2500 [Urine:2500] Intake/Output this shift: No intake/output data recorded.  General appearance: alert, cooperative, and no distress Heart: regular rate and rhythm Lungs: mildly dim in left >right base Abdomen: benign Extremities: + pedal edema Wound: incis healing well  Lab Results: Recent Labs    10/16/21 0228 10/17/21 0341  WBC 10.0 9.4  HGB 11.9* 13.5  HCT 38.0* 42.5  PLT 377 409*   BMET: No results for input(s): NA, K, CL, CO2, GLUCOSE, BUN, CREATININE, CALCIUM in the last 72 hours.  PT/INR:  Recent Labs    10/17/21 0341  LABPROT 25.6*  INR 2.3*   ABG    Component Value Date/Time   PHART 7.407 10/06/2021 1053   HCO3 18.0 (L) 10/06/2021 1053   TCO2 24 10/09/2021 1651   ACIDBASEDEF 6.0 (H) 10/06/2021 1053   O2SAT 73.2 10/10/2021 0526   CBG (last 3)  Recent Labs    10/17/21 1554 10/17/21 2146 10/18/21 0633  GLUCAP 188* 185* 101*    Meds Scheduled Meds:  ALPRAZolam  0.25 mg Oral BID   amiodarone  200 mg Oral BID   amLODipine  10 mg Oral Daily    atorvastatin  80 mg Oral Daily   docusate sodium  200 mg Oral BID   DULoxetine  60 mg Oral Daily   gabapentin  200 mg Oral Q0600   gabapentin  300 mg Oral QHS   hydrALAZINE  10 mg Oral TID   insulin aspart  0-9 Units Subcutaneous TID WC   insulin detemir  38 Units Subcutaneous BID   levothyroxine  100 mcg Oral Daily   lisinopril  40 mg Oral Daily   mouth rinse  15 mL Mouth Rinse BID   metoprolol tartrate  50 mg Oral BID   mycophenolate  1,000 mg Oral Q12H   pantoprazole  40 mg Oral QAC breakfast   predniSONE  5 mg Oral Q breakfast   sodium chloride flush  3 mL Intravenous Q12H   tamsulosin  0.8 mg Oral QHS   warfarin  2.5 mg Oral q1600   Warfarin - Physician Dosing Inpatient   Does not apply q1600   Continuous Infusions:  sodium chloride     PRN Meds:.sodium chloride, acetaminophen, lactulose, ondansetron (ZOFRAN) IV, oxyCODONE, sodium chloride flush, traMADol, traZODone, zolpidem  Xrays No results found.  Assessment/Plan: S/P Procedure(s) (LRB): CORONARY ARTERY BYPASS GRAFTING (CABG) X 1, ON PUMP, USING LEFT INTERNAL MAMMARY ARTERY CONDUIT (N/A) AORTIC VALVE REPLACEMENT (AVR) WITH INSPIRIS RESILIA AORTIC VALVE SIZE 27MM (  N/A) TRANSESOPHAGEAL ECHOCARDIOGRAM (TEE) (N/A) APPLICATION OF CELL SAVER POD#13  1 afeb, VSS s BP 110's-160's, mostly higher end, will increase hydralazine 2 sats good on RA, CPAP at night 3 excellent UOP 4 INR pending 5 H/H fairly stable, borderline anemic 6 BS control is pretty good , cont current management for now 7 insurance auth pending for SNF 8 cont rehab      LOS: 13 days    Odis Luster Pager [phone] 10/18/2021    Chart reviewed, patient examined, agree with above. INR 1.9 this am but only got 2 mg Coumadin last night. Increased to 2.5 mg daily for tonight's dose. Can go to SNF when approved.

## 2021-10-18 NOTE — Progress Notes (Signed)
CARDIAC REHAB PHASE I   Pt continues to make progress after surgery. Plans to d/c to SNF for rehab. Not appropriate for CRP II at this time.  Rufina Falco, RN BSN 10/18/2021 2:55 PM

## 2021-10-18 NOTE — Progress Notes (Addendum)
Pt being d/c, , education complete. Chrisandra Carota, RN 10/18/2021

## 2021-10-18 NOTE — TOC Transition Note (Signed)
Transition of Care [facility]) - CM/SW Discharge Note   Patient Details  Name: Robert [name] MRN: [id number] Date of Birth: [birth year]  Transition of Care [facility]) CM/SW Contact:  Bethann Berkshire, [place] Phone Number: 10/18/2021, 3:40 PM   Clinical Narrative:     Patient will DC to: [facility] Anticipated DC date: 10/18/21 Family notified: Torrence, Hammack (Spouse)  [phone] (Home Phone) Transport by: Corey Harold   Per MD patient ready for DC to [facility]. RN, patient, patient's family, and facility notified of DC. Discharge Summary and FL2 sent to facility. RN to call report prior to discharge ([phone] Room 114). DC packet on chart. Ambulance transport requested for patient.   CSW will sign off for now as social work intervention is no longer needed. Please consult [place] again if new needs arise.   Final next level of care: Skilled Nursing Facility Barriers to Discharge: No Barriers Identified   Patient Goals and CMS Choice Patient states their goals for this hospitalization and ongoing recovery are:: SNF CMS Medicare.gov Compare Post Acute Care list provided to:: Patient Choice offered to / list presented to : Patient  Discharge Placement              Patient chooses bed at: [facility] Patient to be transferred to facility by: [facility] Name of family member notified: Nathanael, Krist (Spouse)   [phone] [facility] Phone) Patient and family notified of of transfer: 10/18/21  Discharge Plan and Services In-house Referral: Clinical Social Work                                   Social Determinants of Health ([place]) Interventions     Readmission Risk Interventions No flowsheet data found.

## 2021-10-18 NOTE — Progress Notes (Signed)
Mobility Specialist: Progress Note   10/18/21 1255  Mobility  Activity Ambulated in hall  Level of Assistance Minimal assist, patient does 75% or more  Assistive Device Front wheel walker  Distance Ambulated (ft) 124 ft (62'x2)  Mobility Ambulated with assistance in hallway  Mobility Response Tolerated well  Mobility performed by Mobility specialist  $Mobility charge 1 Mobility   Pre-Mobility: 77 HR Post-Mobility: 78 HR, 160/68 BP, 94% SpO2  Pt received in recliner and agreeable to mobility session. Pt stopped x1 for seated break lasting 2 minutes, then able to return to room. Pt back to bed after walk per request, all needs met.   [facility] Suzzanne Brunkhorst Mobility Specialist Mobility Specialist 4 [place]: [phone] Mobility Specialist 2 [place] and [place]: [phone]

## 2021-10-18 NOTE — Progress Notes (Signed)
Chest tube sutures removed, pt tolerated well, steri strips applied.   Chrisandra Carota, RN 10/18/2021 12:22 PM

## 2021-10-18 NOTE — Progress Notes (Signed)
Report called to shanda @[facility].   Chrisandra Carota, RN 10/18/2021 3:22 PM

## 2021-10-18 NOTE — Progress Notes (Signed)
Inpatient Rehab Admissions Coordinator:   CIR consult received. Pt. Has already accepted SNF bed offer at [facility]. I will not pursue CIR for this Pt. (Additionally, Pt. Was worked up for [facility] last week and CIR signed off due to lack of sufficient support at home).  Clemens Catholic, [place], [place] Admissions Coordinator  [phone] ([place]) [phone] (office)

## 2021-10-19 ENCOUNTER — Ambulatory Visit: Payer: 59 | Admitting: Endocrinology

## 2021-10-21 ENCOUNTER — Inpatient Hospital Stay (HOSPITAL_COMMUNITY)
Admission: EM | Admit: 2021-10-21 | Discharge: 2021-10-26 | DRG: 304 | Disposition: A | Discharge status: Skilled Nursing Facility | Payer: 59 | Source: Skilled Nursing Facility | Attending: Internal Medicine | Admitting: Internal Medicine

## 2021-10-21 ENCOUNTER — Other Ambulatory Visit: Payer: Self-pay

## 2021-10-21 ENCOUNTER — Emergency Department (HOSPITAL_COMMUNITY): Payer: 59

## 2021-10-21 ENCOUNTER — Encounter (HOSPITAL_COMMUNITY): Payer: Self-pay

## 2021-10-21 DIAGNOSIS — I350 Nonrheumatic aortic (valve) stenosis: Secondary | ICD-10-CM | POA: Diagnosis not present

## 2021-10-21 DIAGNOSIS — Z8371 Family history of colonic polyps: Secondary | ICD-10-CM

## 2021-10-21 DIAGNOSIS — E872 Acidosis, unspecified: Secondary | ICD-10-CM | POA: Diagnosis present

## 2021-10-21 DIAGNOSIS — Z794 Long term (current) use of insulin: Secondary | ICD-10-CM

## 2021-10-21 DIAGNOSIS — Z905 Acquired absence of kidney: Secondary | ICD-10-CM

## 2021-10-21 DIAGNOSIS — Z87891 Personal history of nicotine dependence: Secondary | ICD-10-CM

## 2021-10-21 DIAGNOSIS — Z952 Presence of prosthetic heart valve: Secondary | ICD-10-CM

## 2021-10-21 DIAGNOSIS — I160 Hypertensive urgency: Principal | ICD-10-CM | POA: Diagnosis present

## 2021-10-21 DIAGNOSIS — G4733 Obstructive sleep apnea (adult) (pediatric): Secondary | ICD-10-CM | POA: Diagnosis present

## 2021-10-21 DIAGNOSIS — D849 Immunodeficiency, unspecified: Secondary | ICD-10-CM | POA: Diagnosis present

## 2021-10-21 DIAGNOSIS — I2510 Atherosclerotic heart disease of native coronary artery without angina pectoris: Secondary | ICD-10-CM | POA: Diagnosis present

## 2021-10-21 DIAGNOSIS — E1122 Type 2 diabetes mellitus with diabetic chronic kidney disease: Secondary | ICD-10-CM | POA: Diagnosis present

## 2021-10-21 DIAGNOSIS — H3530 Unspecified macular degeneration: Secondary | ICD-10-CM | POA: Diagnosis present

## 2021-10-21 DIAGNOSIS — E1169 Type 2 diabetes mellitus with other specified complication: Secondary | ICD-10-CM | POA: Diagnosis not present

## 2021-10-21 DIAGNOSIS — I480 Paroxysmal atrial fibrillation: Secondary | ICD-10-CM | POA: Diagnosis present

## 2021-10-21 DIAGNOSIS — Q256 Stenosis of pulmonary artery: Secondary | ICD-10-CM

## 2021-10-21 DIAGNOSIS — I77810 Thoracic aortic ectasia: Secondary | ICD-10-CM | POA: Diagnosis present

## 2021-10-21 DIAGNOSIS — G629 Polyneuropathy, unspecified: Secondary | ICD-10-CM | POA: Diagnosis present

## 2021-10-21 DIAGNOSIS — Z953 Presence of xenogenic heart valve: Secondary | ICD-10-CM

## 2021-10-21 DIAGNOSIS — R0602 Shortness of breath: Secondary | ICD-10-CM | POA: Diagnosis not present

## 2021-10-21 DIAGNOSIS — E785 Hyperlipidemia, unspecified: Secondary | ICD-10-CM | POA: Diagnosis present

## 2021-10-21 DIAGNOSIS — Z940 Kidney transplant status: Secondary | ICD-10-CM

## 2021-10-21 DIAGNOSIS — Z7960 Long term (current) use of unspecified immunomodulators and immunosuppressants: Secondary | ICD-10-CM

## 2021-10-21 DIAGNOSIS — Z7952 Long term (current) use of systemic steroids: Secondary | ICD-10-CM

## 2021-10-21 DIAGNOSIS — D631 Anemia in chronic kidney disease: Secondary | ICD-10-CM | POA: Diagnosis present

## 2021-10-21 DIAGNOSIS — Z951 Presence of aortocoronary bypass graft: Secondary | ICD-10-CM

## 2021-10-21 DIAGNOSIS — N1832 Chronic kidney disease, stage 3b: Secondary | ICD-10-CM | POA: Diagnosis present

## 2021-10-21 DIAGNOSIS — E669 Obesity, unspecified: Secondary | ICD-10-CM | POA: Diagnosis present

## 2021-10-21 DIAGNOSIS — D84821 Immunodeficiency due to drugs: Secondary | ICD-10-CM | POA: Diagnosis present

## 2021-10-21 DIAGNOSIS — R0981 Nasal congestion: Secondary | ICD-10-CM | POA: Diagnosis present

## 2021-10-21 DIAGNOSIS — Z8719 Personal history of other diseases of the digestive system: Secondary | ICD-10-CM

## 2021-10-21 DIAGNOSIS — I959 Hypotension, unspecified: Secondary | ICD-10-CM | POA: Diagnosis present

## 2021-10-21 DIAGNOSIS — Z79890 Hormone replacement therapy (postmenopausal): Secondary | ICD-10-CM

## 2021-10-21 DIAGNOSIS — R778 Other specified abnormalities of plasma proteins: Secondary | ICD-10-CM

## 2021-10-21 DIAGNOSIS — I248 Other forms of acute ischemic heart disease: Secondary | ICD-10-CM | POA: Diagnosis present

## 2021-10-21 DIAGNOSIS — E039 Hypothyroidism, unspecified: Secondary | ICD-10-CM | POA: Diagnosis present

## 2021-10-21 DIAGNOSIS — I6310 Cerebral infarction due to embolism of unspecified precerebral artery: Secondary | ICD-10-CM | POA: Diagnosis present

## 2021-10-21 DIAGNOSIS — N186 End stage renal disease: Secondary | ICD-10-CM | POA: Diagnosis present

## 2021-10-21 DIAGNOSIS — Z79899 Other long term (current) drug therapy: Secondary | ICD-10-CM

## 2021-10-21 DIAGNOSIS — Z8673 Personal history of transient ischemic attack (TIA), and cerebral infarction without residual deficits: Secondary | ICD-10-CM

## 2021-10-21 DIAGNOSIS — F410 Panic disorder [episodic paroxysmal anxiety] without agoraphobia: Secondary | ICD-10-CM | POA: Diagnosis present

## 2021-10-21 DIAGNOSIS — Z7901 Long term (current) use of anticoagulants: Secondary | ICD-10-CM

## 2021-10-21 DIAGNOSIS — Z20822 Contact with and (suspected) exposure to covid-19: Secondary | ICD-10-CM | POA: Diagnosis present

## 2021-10-21 DIAGNOSIS — I132 Hypertensive heart and chronic kidney disease with heart failure and with stage 5 chronic kidney disease, or end stage renal disease: Secondary | ICD-10-CM | POA: Diagnosis present

## 2021-10-21 HISTORY — DX: Hypertensive urgency: I16.0

## 2021-10-21 LAB — RESP PANEL BY RT-PCR (FLU A&B, COVID) ARPGX2
Influenza A by PCR: NEGATIVE
Influenza B by PCR: NEGATIVE
SARS Coronavirus 2 by RT PCR: NEGATIVE

## 2021-10-21 LAB — COMPREHENSIVE METABOLIC PANEL
ALT: 23 U/L (ref 0–44)
AST: 18 U/L (ref 15–41)
Albumin: 3.3 g/dL — ABNORMAL LOW (ref 3.5–5.0)
Alkaline Phosphatase: 102 U/L (ref 38–126)
Anion gap: 7 (ref 5–15)
BUN: 33 mg/dL — ABNORMAL HIGH (ref 8–23)
CO2: 21 mmol/L — ABNORMAL LOW (ref 22–32)
Calcium: 9.1 mg/dL (ref 8.9–10.3)
Chloride: 105 mmol/L (ref 98–111)
Creatinine, Ser: 2.03 mg/dL — ABNORMAL HIGH (ref 0.61–1.24)
GFR, Estimated: 36 mL/min — ABNORMAL LOW (ref >60)
Glucose, Bld: 214 mg/dL — ABNORMAL HIGH (ref 70–99)
Potassium: 3.9 mmol/L (ref 3.5–5.1)
Sodium: 133 mmol/L — ABNORMAL LOW (ref 135–145)
Total Bilirubin: 0.8 mg/dL (ref 0.3–1.2)
Total Protein: 5.6 g/dL — ABNORMAL LOW (ref 6.5–8.1)

## 2021-10-21 LAB — CBC WITH DIFFERENTIAL/PLATELET
Abs Immature Granulocytes: 0.05 10*3/uL (ref 0.00–0.07)
Basophils Absolute: 0.1 10*3/uL (ref 0.0–0.1)
Basophils Relative: 1 %
Eosinophils Absolute: 0.2 10*3/uL (ref 0.0–0.5)
Eosinophils Relative: 3 %
HCT: 38.7 % — ABNORMAL LOW (ref 39.0–52.0)
Hemoglobin: 12.3 g/dL — ABNORMAL LOW (ref 13.0–17.0)
Immature Granulocytes: 1 %
Lymphocytes Relative: 8 %
Lymphs Abs: 0.7 10*3/uL (ref 0.7–4.0)
MCH: 28.1 pg (ref 26.0–34.0)
MCHC: 31.8 g/dL (ref 30.0–36.0)
MCV: 88.4 fL (ref 80.0–100.0)
Monocytes Absolute: 0.7 10*3/uL (ref 0.1–1.0)
Monocytes Relative: 8 %
Neutro Abs: 6.7 10*3/uL (ref 1.7–7.7)
Neutrophils Relative %: 79 %
Platelets: 376 10*3/uL (ref 150–400)
RBC: 4.38 MIL/uL (ref 4.22–5.81)
RDW: 14.5 % (ref 11.5–15.5)
WBC: 8.4 10*3/uL (ref 4.0–10.5)
nRBC: 0 % (ref 0.0–0.2)

## 2021-10-21 LAB — PROTIME-INR
INR: 1.3 — ABNORMAL HIGH (ref 0.8–1.2)
Prothrombin Time: 15.9 seconds — ABNORMAL HIGH (ref 11.4–15.2)

## 2021-10-21 LAB — CBG MONITORING, ED: Glucose-Capillary: 204 mg/dL — ABNORMAL HIGH (ref 70–99)

## 2021-10-21 LAB — BRAIN NATRIURETIC PEPTIDE: B Natriuretic Peptide: 324 pg/mL — ABNORMAL HIGH (ref 0.0–100.0)

## 2021-10-21 LAB — TROPONIN I (HIGH SENSITIVITY)
Troponin I (High Sensitivity): 144 ng/L (ref <18)
Troponin I (High Sensitivity): 154 ng/L (ref <18)

## 2021-10-21 MED ORDER — MYCOPHENOLATE MOFETIL 250 MG PO CAPS
750.0000 mg | ORAL_CAPSULE | Freq: Once | ORAL | Status: DC
  Filled 2021-10-21: qty 3

## 2021-10-21 MED ORDER — ONDANSETRON HCL 4 MG PO TABS
4.0000 mg | ORAL_TABLET | Freq: Four times a day (QID) | ORAL | Status: DC | PRN
  Filled 2021-10-21: qty 1

## 2021-10-21 MED ORDER — ACETAMINOPHEN 325 MG PO TABS
650.0000 mg | ORAL_TABLET | Freq: Once | ORAL | Status: AC
  Administered 2021-10-21: 650 mg via ORAL
  Filled 2021-10-21: qty 2

## 2021-10-21 MED ORDER — LEVOTHYROXINE SODIUM 100 MCG PO TABS
100.0000 ug | ORAL_TABLET | Freq: Every day | ORAL | Status: DC
  Administered 2021-10-22 – 2021-10-26 (×5): 100 ug via ORAL
  Filled 2021-10-21 (×5): qty 1

## 2021-10-21 MED ORDER — ONDANSETRON HCL 4 MG/2ML IJ SOLN
4.0000 mg | Freq: Four times a day (QID) | INTRAMUSCULAR | Status: DC | PRN
  Filled 2021-10-21: qty 2

## 2021-10-21 MED ORDER — INSULIN ASPART 100 UNIT/ML IJ SOLN
0.0000 [IU] | Freq: Every day | INTRAMUSCULAR | Status: DC
  Administered 2021-10-21 – 2021-10-25 (×4): 2 [IU] via SUBCUTANEOUS

## 2021-10-21 MED ORDER — SENNOSIDES-DOCUSATE SODIUM 8.6-50 MG PO TABS: 1.0000 | ORAL_TABLET | Freq: Every evening | ORAL | Status: DC | PRN

## 2021-10-21 MED ORDER — SODIUM CHLORIDE 0.9 % IV SOLN: Freq: Once | INTRAVENOUS | Status: DC

## 2021-10-21 MED ORDER — LABETALOL HCL 5 MG/ML IV SOLN
10.0000 mg | Freq: Once | INTRAVENOUS | Status: AC
Start: 2021-10-21 — End: 2021-10-21
  Administered 2021-10-21: 10 mg via INTRAVENOUS
  Filled 2021-10-21: qty 4

## 2021-10-21 MED ORDER — PREDNISONE 5 MG PO TABS
5.0000 mg | ORAL_TABLET | Freq: Every day | ORAL | Status: DC
  Administered 2021-10-22 – 2021-10-26 (×5): 5 mg via ORAL
  Filled 2021-10-21 (×6): qty 1

## 2021-10-21 MED ORDER — ALPRAZOLAM 0.25 MG PO TABS
0.2500 mg | ORAL_TABLET | Freq: Three times a day (TID) | ORAL | Status: DC | PRN
  Filled 2021-10-21: qty 1

## 2021-10-21 MED ORDER — METOPROLOL TARTRATE 50 MG PO TABS
50.0000 mg | ORAL_TABLET | Freq: Two times a day (BID) | ORAL | Status: DC
  Administered 2021-10-21 – 2021-10-23 (×4): 50 mg via ORAL
  Filled 2021-10-21 (×2): qty 2
  Filled 2021-10-21 (×2): qty 1

## 2021-10-21 MED ORDER — GABAPENTIN 100 MG PO CAPS
200.0000 mg | ORAL_CAPSULE | Freq: Every day | ORAL | Status: DC
  Administered 2021-10-22 – 2021-10-26 (×5): 200 mg via ORAL
  Filled 2021-10-21 (×5): qty 2

## 2021-10-21 MED ORDER — MYCOPHENOLATE MOFETIL 250 MG PO CAPS: 1000.0000 mg | ORAL_CAPSULE | Freq: Two times a day (BID) | ORAL | Status: DC

## 2021-10-21 MED ORDER — HYDRALAZINE HCL 25 MG PO TABS: 25.0000 mg | ORAL_TABLET | Freq: Four times a day (QID) | ORAL | Status: DC | PRN

## 2021-10-21 MED ORDER — WARFARIN SODIUM 4 MG PO TABS
4.0000 mg | ORAL_TABLET | Freq: Once | ORAL | Status: AC
  Administered 2021-10-21: 4 mg via ORAL
  Filled 2021-10-21: qty 1

## 2021-10-21 MED ORDER — HYDRALAZINE HCL 25 MG PO TABS
25.0000 mg | ORAL_TABLET | Freq: Three times a day (TID) | ORAL | Status: DC
  Administered 2021-10-21 – 2021-10-23 (×5): 25 mg via ORAL
  Filled 2021-10-21 (×5): qty 1

## 2021-10-21 MED ORDER — DULOXETINE HCL 60 MG PO CPEP
60.0000 mg | ORAL_CAPSULE | Freq: Every day | ORAL | Status: DC
  Administered 2021-10-21 – 2021-10-26 (×6): 60 mg via ORAL
  Filled 2021-10-21 (×6): qty 1

## 2021-10-21 MED ORDER — AMLODIPINE BESYLATE 10 MG PO TABS
10.0000 mg | ORAL_TABLET | Freq: Every day | ORAL | Status: DC
  Administered 2021-10-21 – 2021-10-26 (×6): 10 mg via ORAL
  Filled 2021-10-21 (×3): qty 1
  Filled 2021-10-21: qty 2
  Filled 2021-10-21: qty 1
  Filled 2021-10-21: qty 2

## 2021-10-21 MED ORDER — ACETAMINOPHEN 650 MG RE SUPP: 650.0000 mg | Freq: Four times a day (QID) | RECTAL | Status: DC | PRN

## 2021-10-21 MED ORDER — WARFARIN - PHARMACIST DOSING INPATIENT: Freq: Every day | Status: DC

## 2021-10-21 MED ORDER — AMIODARONE HCL 200 MG PO TABS
200.0000 mg | ORAL_TABLET | Freq: Two times a day (BID) | ORAL | Status: DC
  Administered 2021-10-21 – 2021-10-26 (×10): 200 mg via ORAL
  Filled 2021-10-21 (×10): qty 1

## 2021-10-21 MED ORDER — TAMSULOSIN HCL 0.4 MG PO CAPS
0.8000 mg | ORAL_CAPSULE | Freq: Every day | ORAL | Status: DC
  Administered 2021-10-21 – 2021-10-25 (×5): 0.8 mg via ORAL
  Filled 2021-10-21 (×5): qty 2

## 2021-10-21 MED ORDER — ATORVASTATIN CALCIUM 80 MG PO TABS
80.0000 mg | ORAL_TABLET | Freq: Every day | ORAL | Status: DC
  Administered 2021-10-21 – 2021-10-26 (×6): 80 mg via ORAL
  Filled 2021-10-21: qty 2
  Filled 2021-10-21 (×2): qty 1
  Filled 2021-10-21: qty 2
  Filled 2021-10-21 (×2): qty 1

## 2021-10-21 MED ORDER — INSULIN ASPART 100 UNIT/ML IJ SOLN
0.0000 [IU] | Freq: Three times a day (TID) | INTRAMUSCULAR | Status: DC
  Administered 2021-10-22 (×2): 2 [IU] via SUBCUTANEOUS
  Administered 2021-10-22: 5 [IU] via SUBCUTANEOUS
  Administered 2021-10-23 – 2021-10-24 (×4): 3 [IU] via SUBCUTANEOUS
  Administered 2021-10-24 – 2021-10-26 (×6): 2 [IU] via SUBCUTANEOUS
  Administered 2021-10-26: 3 [IU] via SUBCUTANEOUS

## 2021-10-21 MED ORDER — MYCOPHENOLATE MOFETIL 250 MG PO CAPS
1000.0000 mg | ORAL_CAPSULE | Freq: Two times a day (BID) | ORAL | Status: DC
  Administered 2021-10-21 – 2021-10-26 (×10): 1000 mg via ORAL
  Filled 2021-10-21 (×11): qty 4

## 2021-10-21 MED ORDER — MYCOPHENOLATE MOFETIL 250 MG PO CAPS
250.0000 mg | ORAL_CAPSULE | Freq: Two times a day (BID) | ORAL | Status: DC
  Filled 2021-10-21: qty 1

## 2021-10-21 MED ORDER — LISINOPRIL 20 MG PO TABS
40.0000 mg | ORAL_TABLET | Freq: Every day | ORAL | Status: DC
  Administered 2021-10-21 – 2021-10-26 (×6): 40 mg via ORAL
  Filled 2021-10-21 (×6): qty 2

## 2021-10-21 MED ORDER — SODIUM CHLORIDE 0.9% FLUSH
3.0000 mL | Freq: Two times a day (BID) | INTRAVENOUS | Status: DC
  Administered 2021-10-22 – 2021-10-26 (×9): 3 mL via INTRAVENOUS

## 2021-10-21 MED ORDER — ACETAMINOPHEN 325 MG PO TABS
650.0000 mg | ORAL_TABLET | Freq: Four times a day (QID) | ORAL | Status: DC | PRN
  Administered 2021-10-22 – 2021-10-24 (×5): 650 mg via ORAL
  Filled 2021-10-21 (×6): qty 2

## 2021-10-21 NOTE — Progress Notes (Signed)
ANTICOAGULATION CONSULT NOTE - Initial Consult  Pharmacy Consult for Warfarin Indication: atrial fibrillation  No Known Allergies  Patient Measurements: Height: 5\' 11"  (180.3 cm) Weight: 122.5 kg (270 lb) IBW/kg (Calculated) : 75.3  Vital Signs: Temp: 98.2 F (36.8 C) (01/14 1036) Temp Source: Oral (01/14 1036) BP: 161/71 (01/14 2000) Pulse Rate: 69 (01/14 2000)  Labs: Recent Labs    10/21/21 1634 10/21/21 1811  HGB 12.3*  --   HCT 38.7*  --   PLT 376  --   LABPROT 15.9*  --   INR 1.3*  --   CREATININE 2.03*  --   TROPONINIHS 144* 154*    Estimated Creatinine Clearance: 50.3 mL/min (A) (by C-G formula based on SCr of 2.03 mg/dL (H)).   Medical History: Past Medical History:  Diagnosis Date   Anemia    low hgb at present   Arthritis    FEET   Blood transfusion without reported diagnosis 1974   kidney removed - L   Cancer ([place])    skin ca right shoulder, plastic dsyplasia, pre-Ca polpys removed on Colonoscopy- 07/2014   Colon polyps 07/23/2014   Tubular adenomas x 5   Constipation    Coronary artery disease    Diabetes mellitus without complication ([place])    Type 2   Difficult intubation    unsure of actual problem but it was during the January 28, 2015 procedure.   Dysrhythmia    Eczema    ESRD (end stage renal disease) ([place])    hemodialysis 06/2014-02/2017, s/p living unrelated kidney transplant 02/26/17   Headache(784.0)    slight dull h/a due kidney failure   Heart murmur    aortic stenosis (moderate-severe 04/2021)   Hyperlipidemia    Hypertension    SVT h/o , followed by Dr. Claiborne Billings   Hypothyroidism    Macular degeneration    Neuropathy    right hand and both feet   PAT (paroxysmal atrial tachycardia) (HCC)    PSVT (paroxysmal supraventricular tachycardia) (HCC)    SBO (small bowel obstruction) ([place]) 05/2015   Shortness of breath    with exertion   Sleep apnea 06/18/2011   split-night sleep study- [facility]., BiPAP- 13-16    Thyroid disease    Umbilical hernia    s/p repair 04/28/15   Medications:  (Not in a hospital admission)  Scheduled:   amiodarone  200 mg Oral BID   amLODipine  10 mg Oral Daily   atorvastatin  80 mg Oral Daily   DULoxetine  60 mg Oral Daily   [START ON 10/22/2021] gabapentin  200 mg Oral Daily   hydrALAZINE  25 mg Oral TID   insulin aspart  0-5 Units Subcutaneous QHS   [START ON 10/22/2021] insulin aspart  0-9 Units Subcutaneous TID WC   [START ON 10/22/2021] levothyroxine  100 mcg Oral QAC breakfast   lisinopril  40 mg Oral Daily   metoprolol tartrate  50 mg Oral BID   mycophenolate  1,000 mg Oral Q12H   [START ON 10/22/2021] predniSONE  5 mg Oral Q breakfast   sodium chloride flush  3 mL Intravenous Q12H   tamsulosin  0.8 mg Oral QHS   Infusions:   Assessment: 64 yom s/p CABG and AVR 2 weeks ago. Warfarin per pharmacy consult placed for AF.  Home dose of warfarin is 2.5 mg daily. Patient last taken 1/13 at 1800.  PT/INR today is 15.9/1.3 Hgb 12.3; plt 376  Goal of Therapy:  INR 2-2.5 (  per 1/6 note by MD Bartle) Monitor platelets by anticoagulation protocol: Yes   Plan:  Give 4 mg warfarin tonight, subsequent dosing per INR Monitor for DDIs Monitor CBC, Daily INR  Lorelei Pont, PharmD, BCPS 10/21/2021 8:24 PM ED Clinical Pharmacist -  [phone]

## 2021-10-21 NOTE — ED Triage Notes (Signed)
Pt BIB EMS from rehab when pt experienced dizziness and weakness, pt is 3 weeks post CABG, at 0600 this AM he had dizzy/weakness denies chest pain and LOC but does endorse nausea and vomiting yesterday

## 2021-10-21 NOTE — ED Provider Notes (Signed)
I assumed care of patient at shift change from previous team, please see their note for full H&P. Briefly patient had a CABG and aortic valve replacement 2 weeks ago, is status post renal transplant on immunosuppressive therapy who presents today for dizziness, lightheadedness, and shortness of breath that occurred this morning at 6 AM when he tried to get up.  He has reportedly been having issues with hypertension since his blood pressure medications were changed after his surgery.  Plan is to follow-up on labs.  As patient is a very difficult stick he is currently awaiting IV team.   Labs are obtained and reviewed, creatinine and GFR appear consistent with his baseline.  He is hyperglycemic at 214. He is not significantly anemic.  His INR is only 1.3, he reports his goal is 2-3. BNP is slightly elevated at 324.  Initial troponin is elevated at 144 with repeat at 154. While in the emergency room patient was significantly hypertensive.  I spoke with cardiology regarding patient's elevated troponin, cardiology feels this is most likely related to his recent CABG state and valve replacement rather than evidence of ACS.  Chest x-ray does show bibasilar atelectasis/consolidation and small bilateral pleural effusions.  EKG without significant ischemia.  Patient reported to me that he had a headache.  Given his significant hypertension along with findings that he recently had a embolic scattered stroke he is at a increased risk of intracranial hemorrhage especially as he is anticoagulated. CT head is obtained without acute abnormality or cause for his headache. He is treated with Tylenol. While in my care he was given a dose of labetalol to try and help improve his blood pressure with mild temporary results.  Dr. Mohammed Kindle, on-call for cardiothoracic surgery who did patient's surgery, is aware he is here.  I spoke with Dr. Myna Hidalgo, hospitalist, who will see patient for admission. Given his multiple  risk factors and complicated medical conditions including recent CABG, status post transplanted kidney, recent stroke and the degree of his hypertension I feel it is safest for patient to be admitted for better blood pressure control.  The patient appears reasonably stabilized for admission considering the current resources, flow, and capabilities available in the ED at this time, and I doubt any other [facility] requiring further screening and/or treatment in the ED prior to admission.  Note: Portions of this report may have been transcribed using voice recognition software. Every effort was made to ensure accuracy; however, inadvertent computerized transcription errors may be present   Labs Reviewed  COMPREHENSIVE METABOLIC PANEL - Abnormal; Notable for the following components:      Result Value   Sodium 133 (*)    CO2 21 (*)    Glucose, Bld 214 (*)    BUN 33 (*)    Creatinine, Ser 2.03 (*)    Total Protein 5.6 (*)    Albumin 3.3 (*)    GFR, Estimated 36 (*)    All other components within normal limits  CBC WITH DIFFERENTIAL/PLATELET - Abnormal; Notable for the following components:   Hemoglobin 12.3 (*)    HCT 38.7 (*)    All other components within normal limits  PROTIME-INR - Abnormal; Notable for the following components:   Prothrombin Time 15.9 (*)    INR 1.3 (*)    All other components within normal limits  BRAIN NATRIURETIC PEPTIDE - Abnormal; Notable for the following components:   B Natriuretic Peptide 324.0 (*)    All other components within normal limits  TROPONIN  I (HIGH SENSITIVITY) - Abnormal; Notable for the following components:   Troponin I (High Sensitivity) 144 (*)    All other components within normal limits  TROPONIN I (HIGH SENSITIVITY) - Abnormal; Notable for the following components:   Troponin I (High Sensitivity) 154 (*)    All other components within normal limits  RESP PANEL BY RT-PCR (FLU A&B, COVID) ARPGX2  HIV ANTIBODY (ROUTINE TESTING W REFLEX)  BASIC  METABOLIC PANEL  MAGNESIUM  CBC  PROTIME-INR    CT HEAD WO CONTRAST (5MM)  Result Date: 10/21/2021 CLINICAL DATA:  Headache, dizziness, weakness EXAM: CT HEAD WITHOUT CONTRAST TECHNIQUE: Contiguous axial images were obtained from the base of the skull through the vertex without intravenous contrast. RADIATION DOSE REDUCTION: This exam was performed according to the departmental dose-optimization program which includes automated exposure control, adjustment of the mA and/or kV according to patient size and/or use of iterative reconstruction technique. COMPARISON:  10/07/2021, 10/11/2021 FINDINGS: Brain: Hypodensities within the left frontal parietal periventricular white matter consistent with areas of acute infarct seen on previous MRI. No new infarct or hemorrhage. Lateral ventricles and midline structures are unremarkable. No acute extra-axial fluid collections. No mass effect. Vascular: No hyperdense vessel or unexpected calcification. Stable atherosclerosis. Skull: Normal. Negative for fracture or focal lesion. Sinuses/Orbits: No acute finding. Other: None. IMPRESSION: 1. Subacute small vessel ischemic changes within the left frontal parietal white matter, corresponding to previous MRI findings. 2. No acute infarct or hemorrhage. Electronically Signed   By: Randa Ngo M.D.   On: 10/21/2021 19:11   DG Chest Portable 1 View  Result Date: 10/21/2021 CLINICAL DATA:  61 year old male with history of shortness of breath, dizziness and weakness. EXAM: PORTABLE CHEST 1 VIEW COMPARISON:  Chest x-ray 10/09/2021. FINDINGS: Bibasilar opacities may reflect areas of atelectasis and/or consolidation, with superimposed small bilateral pleural effusions. No pneumothorax. No evidence of pulmonary edema. Heart size is mildly enlarged. Upper mediastinal contours are within normal limits. Status post median sternotomy for aortic valve replacement with what appears to be a stented bioprosthesis. IMPRESSION: 1.  Bibasilar areas of atelectasis and/or consolidation with superimposed small bilateral pleural effusions. Electronically Signed   By: Vinnie Langton M.D.   On: 10/21/2021 12:09       Ollen Gross 10/21/21 2209    Dorie Rank, MD 10/22/21 [phone]

## 2021-10-21 NOTE — ED Notes (Signed)
Patient transported to CT 

## 2021-10-21 NOTE — H&P (Signed)
History and Physical    Robert [name] KDT:[id number] DOB: May 02, 1961 DOA: 10/21/2021  PCP: Haywood Pao, MD   Patient coming from: SNF  Chief Complaint: Dizzy, nausea, high BP   HPI: Robert [name] is a very pleasant 61 y.o. 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  The patient reports that he was having episodes of severe hypertension during the recent hospitalization and would experience dizzy sensation, nausea, and mild headaches with some of these episodes.  Blood pressure medications were adjusted during the hospitalization but he has continued to have these episodes at the rehab facility.  He was treated with clonidine during 1 recent episode but otherwise reports that there has not been changes to his medications since discharge.  He had a particularly severe episode of this this morning and was sent to the ED.  He had not taken any of his antihypertensives yet today.  He denies any chest pain or change in chronic dyspnea.  His chronic leg swelling is currently better than usual.  He denies any new focal numbness or weakness.  Denies fevers or chills.  ED Course: Upon arrival to the ED, patient is found to be afebrile, saturating well on room air, and with blood pressure as high as 204/119.  EKG features sinus rhythm with LVH and repolarization abnormality.  Chest x-ray with bibasilar atelectasis and/or consolidation with small bilateral pleural effusions.  Head CT notable for subacute small vessel ischemic changes consistent with prior MRI but without any acute findings.  Chemistry panel notable for glucose 214 and creatinine 2.04.  Troponin was 744 and then  154.  BNP elevated to 324.  ED physician discussed case with cardiology and the patient was given IV labetalol and acetaminophen.  Review of Systems:  All other systems reviewed and apart from HPI, are negative.  Past Medical History:  Diagnosis Date   Anemia    low hgb at present   Arthritis    FEET   Blood transfusion without reported diagnosis 1974   kidney removed - L   Cancer ([place])    skin ca right shoulder, plastic dsyplasia, pre-Ca polpys removed on Colonoscopy- 07/2014   Colon polyps 07/23/2014   Tubular adenomas x 5   Constipation    Coronary artery disease    Diabetes mellitus without complication ([place])    Type 2   Difficult intubation    unsure of actual problem but it was during the January 28, 2015 procedure.   Dysrhythmia    Eczema    ESRD (end stage renal disease) ([place])    hemodialysis 06/2014-02/2017, s/p living unrelated kidney transplant 02/26/17   Headache(784.0)    slight dull h/a due kidney failure   Heart murmur    aortic stenosis (moderate-severe 04/2021)   Hyperlipidemia    Hypertension    SVT h/o , followed by Dr. Claiborne Billings   Hypothyroidism    Macular degeneration    Neuropathy    right hand and both feet   PAT (paroxysmal atrial tachycardia) ([facility])    PSVT (paroxysmal supraventricular tachycardia) ([facility])    SBO (small bowel obstruction) ([place]) 05/2015   Shortness of breath    with exertion   Sleep apnea 06/18/2011   split-night sleep study-  [facility]., BiPAP- 13-16   Thyroid disease    Umbilical hernia    s/p repair 04/28/15    Past Surgical History:  Procedure Laterality Date   AORTIC VALVE REPLACEMENT N/A 10/05/2021   Procedure: AORTIC VALVE REPLACEMENT (AVR) WITH INSPIRIS RESILIA AORTIC VALVE SIZE 27MM;  Surgeon: [name], MD;  Location: [facility];  Service: Open Heart Surgery;  Laterality: N/A;   AV FISTULA PLACEMENT Right 02/25/2015   Procedure: RIGHT ARTERIOVENOUS (AV) FISTULA CREATION;  Surgeon: Serafina Mitchell, MD;   Location: [place] [place];  Service: Vascular;  Laterality: Right;   AV FISTULA PLACEMENT Right 09/15/2015   Procedure: ARTERIOVENOUS (AV) FISTULA CREATION- RIGHT ARM;  Surgeon: Serafina Mitchell, MD;  Location: [place];  Service: Vascular;  Laterality: Right;   CAPD INSERTION N/A 05/18/2014   Procedure: LAPAROSCOPIC INSERTION CONTINUOUS AMBULATORY PERITONEAL DIALYSIS  (CAPD) CATHETER;  Surgeon: Ralene Ok, MD;  Location: [place];  Service: General;  Laterality: N/A;   COLONOSCOPY     COLONOSCOPY WITH PROPOFOL N/A 07/12/2016   Procedure: COLONOSCOPY WITH PROPOFOL;  Surgeon: [name], MD;  [facility];  Service: Endoscopy;  Laterality: N/A;   CORONARY ARTERY BYPASS GRAFT N/A 10/05/2021   Procedure: CORONARY ARTERY BYPASS GRAFTING (CABG) X 1, ON PUMP, USING LEFT INTERNAL MAMMARY ARTERY CONDUIT;  Surgeon: [name], MD;  Location: [place];  Service: Open Heart Surgery;  Laterality: N/A;   declotting of fistula  08/2015   ESOPHAGOGASTRODUODENOSCOPY (EGD) WITH PROPOFOL N/A 07/12/2016   Procedure: ESOPHAGOGASTRODUODENOSCOPY (EGD) WITH PROPOFOL;  Surgeon: [name], MD;  [facility];  Service: Endoscopy;  Laterality: N/A;   FISTULA SUPERFICIALIZATION Right 02/07/2016   Procedure: RIIGHT UPPER ARM FISTULA SUPERFICIALIZATION;  Surgeon: Elam Dutch, MD;  Location: [address];  Service: Vascular;  Laterality: Right;   IJ catheter insertion     INSERTION OF DIALYSIS CATHETER Right 02/25/2015   Procedure: INSERTION OF RIGHT INTERNAL JUGULAR DIALYSIS CATHETER;  Surgeon: Serafina Mitchell, MD;  Location: [facility];  Service: Vascular;  Laterality: Right;   KIDNEY TRANSPLANT     LAPAROSCOPIC REPOSITIONING CAPD CATHETER N/A 06/16/2014   Procedure: LAPAROSCOPIC REPOSITIONING CAPD CATHETER;  Surgeon: Ralene Ok, MD;  Location: [place];  Service: General;  Laterality: N/A;   LAPAROSCOPY N/A 05/04/2015   Procedure: LAPAROSCOPY DIAGNOSTIC LYSIS OF ADHESIONS;  Surgeon: Coralie Keens, MD;  Location:  [place];  Service: General;  Laterality: N/A;   MINOR REMOVAL OF PERITONEAL DIALYSIS CATHETER N/A 04/28/2015   Procedure:  REMOVAL OF PERITONEAL DIALYSIS CATHETER;  Surgeon: Coralie Keens, MD;  Location: [place];  Service: General;  Laterality: N/A;   NEPHRECTOMY Left 1974   RENAL BIOPSY Right 2012   REVISON OF ARTERIOVENOUS FISTULA Right 05/26/2015   Procedure: SUPERFICIALIZATION OF ARTERIOVENOUS FISTULA WITH SIDE BRANCH LIGATIONS;  Surgeon: Serafina Mitchell, MD;  [facility];  Service: Vascular;  Laterality: Right;   RIGHT/LEFT HEART CATH AND CORONARY ANGIOGRAPHY N/A 08/22/2021   Procedure: RIGHT/LEFT HEART CATH AND CORONARY ANGIOGRAPHY;  Surgeon: Troy Sine, MD;  Location: [facility];  Service: Cardiovascular;  Laterality: N/A;   SKIN CANCER EXCISION     right shoulder   SVT ABLATION N/A 11/23/2016   Procedure: SVT Ablation;  Surgeon: Will Meredith Leeds, MD;  Location: [facility];  Service: Cardiovascular;  Laterality: N/A;   TEE WITHOUT CARDIOVERSION N/A 10/05/2021   Procedure: TRANSESOPHAGEAL ECHOCARDIOGRAM (TEE);  Surgeon: [name], MD;  Location: [place];  Service: Open Heart Surgery;  Laterality: N/A;   UMBILICAL HERNIA REPAIR N/A 05/18/2014   Procedure: HERNIA REPAIR UMBILICAL ADULT;  Surgeon: Ralene Ok, MD;  Location: [facility];  Service: General;  Laterality: N/A;   UMBILICAL HERNIA REPAIR N/A 04/28/2015   Procedure: UMBILICAL HERNIA REPAIR WITH MESH;  Surgeon: Coralie Keens, MD;  Location: [facility];  Service: General;  Laterality: N/A;    Social History:   reports that he quit smoking about 8 years ago. His smoking use included cigars. He has never used smokeless tobacco. He reports current alcohol use. He reports that he does not use drugs.  No Known Allergies  Family History  Problem Relation Age of Onset   Colon polyps Father    Colon cancer Neg Hx    Stomach cancer Neg Hx      Prior to Admission medications   Medication Sig Start Date End  Date Taking? Authorizing Provider  acetaminophen (TYLENOL) 325 MG tablet Take 2 tablets (650 mg total) by mouth every 4 (four) hours as needed for moderate pain. 10/15/21  Yes Lars Pinks M, PA-C  amiodarone (PACERONE) 200 MG tablet Take 1 tablet (200 mg total) by mouth 2 (two) times daily. 10/18/21  Yes Gold, Wayne E, PA-C  amLODipine (NORVASC) 10 MG tablet TAKE 1 TABLET BY MOUTH EVERY DAY Patient taking differently: Take 10 mg by mouth daily. 09/11/21  Yes Troy Sine, MD  atorvastatin (LIPITOR) 80 MG tablet Take 1 tablet (80 mg total) by mouth daily. 10/19/21  Yes Gold, Wayne E, PA-C  belatacept (NULOJIX) 250 MG SOLR injection Inject 24.5 mLs into the vein every 28 (twenty-eight) days.  08/30/17  Yes [provider]  cholecalciferol (VITAMIN D3) 25 MCG (1000 UNIT) tablet Take 1,000 Units by mouth in the morning and at bedtime.   Yes [provider]  DULoxetine (CYMBALTA) 60 MG capsule Take 60 mg by mouth daily. 07/19/20  Yes [provider]  gabapentin (NEURONTIN) 100 MG capsule Take 200 mg by mouth daily. 06/02/16  Yes [provider]  hydrALAZINE (APRESOLINE) 25 MG tablet Take 1 tablet (25 mg total) by mouth 3 (three) times daily. Patient taking differently: Take 50 mg by mouth 3 (three) times daily. 10/18/21  Yes Gold, Wayne E, PA-C  insulin aspart (NOVOLOG) 100 UNIT/ML injection Inject 0-9 Units into the skin 3 (three) times daily with meals. Patient taking differently: Inject 0-9 Units into the skin 3 (three) times daily with meals. 70-120=0, 121-150=1 units, 151-200=2 units, 201-250=3 units, 251-300= 5units, 301-350= 7units, 351-400= 9 units, 401 NOTIFY MD/NP IF CBG IS > 400 10/18/21  Yes Gold, Wayne E, PA-C  insulin glargine (LANTUS) 100 unit/mL SOPN Inject 38 Units into the skin 2 (two) times daily.   Yes [provider]  levothyroxine (SYNTHROID) 100 MCG tablet TAKE 1 TABLET BY MOUTH EVERY DAY Patient taking differently: Take 100 mcg by  mouth daily before breakfast. 06/13/21  Yes Elayne Snare, MD  lisinopril (ZESTRIL) 40 MG tablet Take 1 tablet (40 mg total) by mouth daily. 10/19/21  Yes Gold, Wayne E, PA-C  metoprolol tartrate (LOPRESSOR) 50 MG tablet Take 1 tablet (50 mg total) by mouth 2 (two) times daily. 06/16/21  Yes Troy Sine, MD  mycophenolate (CELLCEPT) 250 MG capsule Take 1,000 mg by mouth every 12 (twelve) hours.    Yes [provider]  predniSONE (DELTASONE) 5 MG tablet Take 5 mg by mouth daily with breakfast.   Yes [provider]  tamsulosin (FLOMAX) 0.4 MG  CAPS capsule Take 0.8 mg by mouth at bedtime.    Yes [provider]  testosterone cypionate (DEPOTESTOSTERONE CYPIONATE) 200 MG/ML injection Inject 0.6 mLs (120 mg total) into the muscle every 7 (seven) days. 07/10/21  Yes Elayne Snare, MD  warfarin (COUMADIN) 2.5 MG tablet Take 1 tablet (2.5 mg total) by mouth daily at 4 PM. 10/18/21  Yes Gold, Patrick Jupiter E, PA-C  insulin detemir (LEVEMIR) 100 UNIT/ML injection Inject 0.38 mLs (38 Units total) into the skin 2 (two) times daily. Patient not taking: Reported on 10/21/2021 10/18/21   John Giovanni, PA-C  lactulose (CHRONULAC) 10 GM/15ML solution Take 10-20 g by mouth daily as needed (constipation). Patient not taking: Reported on 10/21/2021 02/23/15   [provider]  oxyCODONE (OXY IR/ROXICODONE) 5 MG immediate release tablet Take 1 tablet (5 mg total) by mouth every 6 (six) hours as needed for up to 7 days for severe pain. Patient not taking: Reported on 10/21/2021 10/18/21 10/25/21  John Giovanni, PA-C    Physical Exam: Vitals:   10/21/21 1830 10/21/21 1904 10/21/21 2000 10/21/21 2045  BP: (!) 194/74 (!) 167/78 (!) 161/71 (!) 184/74  Pulse: 77 72 69 73  Resp: 20 20 (!) 21 (!) 22  Temp:      TempSrc:      SpO2: 96% 96% 95% 97%  Weight:      Height:        Constitutional: NAD, calm  Eyes: PERTLA, lids and conjunctivae normal ENMT: Mucous membranes are moist. Posterior pharynx  clear of any exudate or lesions.   Neck: supple, no masses  Respiratory:  no wheezing, no crackles. No accessory muscle use.  Cardiovascular: S1 & S2 heard, regular rate and rhythm. Pretibial pitting edema bilaterally. Abdomen: No distension, no tenderness, soft. Bowel sounds active.  Musculoskeletal: no clubbing / cyanosis. No joint deformity upper and lower extremities.   Skin: no significant rashes, lesions, ulcers. Warm, dry, well-perfused. Neurologic: CN 2-12 grossly intact. Moving all extremities. Alert and oriented.  Psychiatric: Very pleasant. Cooperative.    Labs and Imaging on Admission: I have personally reviewed following labs and imaging studies  CBC: Recent Labs  Lab 10/15/21 0201 10/16/21 0228 10/17/21 0341 10/18/21 0643 10/21/21 1634  WBC 8.8 10.0 9.4 9.1 8.4  NEUTROABS  --   --   --   --  6.7  HGB 12.2* 11.9* 13.5 12.4* 12.3*  HCT 37.6* 38.0* 42.5 40.1 38.7*  MCV 89.1 90.5 89.3 90.7 88.4  PLT 341 377 409* 408* 347   Basic Metabolic Panel: Recent Labs  Lab 10/15/21 0201 10/21/21 1634  NA 134* 133*  K 4.5 3.9  CL 107 105  CO2 21* 21*  GLUCOSE 167* 214*  BUN 51* 33*  CREATININE 2.14* 2.03*  CALCIUM 9.2 9.1   GFR: Estimated Creatinine Clearance: 50.3 mL/min (A) (by C-G formula based on SCr of 2.03 mg/dL (H)). Liver Function Tests: Recent Labs  Lab 10/21/21 1634  AST 18  ALT 23  ALKPHOS 102  BILITOT 0.8  PROT 5.6*  ALBUMIN 3.3*   No results for input(s): LIPASE, AMYLASE in the last 168 hours. No results for input(s): AMMONIA in the last 168 hours. Coagulation Profile: Recent Labs  Lab 10/15/21 0201 10/16/21 0228 10/17/21 0341 10/18/21 0643 10/21/21 1634  INR 2.6* 2.8* 2.3* 1.9* 1.3*   Cardiac Enzymes: No results for input(s): CKTOTAL, CKMB, CKMBINDEX, TROPONINI in the last 168 hours. BNP (last 3 results) No results for input(s): PROBNP in the last 8760 hours.  HbA1C: No results for input(s): HGBA1C in the last 72 hours. CBG: Recent  Labs  Lab 10/17/21 1554 10/17/21 2146 10/18/21 0633 10/18/21 1141 10/18/21 1610  GLUCAP 188* 185* 101* 175* 154*   Lipid Profile: No results for input(s): CHOL, HDL, LDLCALC, TRIG, CHOLHDL, LDLDIRECT in the last 72 hours. Thyroid Function Tests: No results for input(s): TSH, T4TOTAL, FREET4, T3FREE, THYROIDAB in the last 72 hours. Anemia Panel: No results for input(s): VITAMINB12, FOLATE, FERRITIN, TIBC, IRON, RETICCTPCT in the last 72 hours. Urine analysis:    Component Value Date/Time   COLORURINE AMBER (A) 10/06/2021 1523   APPEARANCEUR HAZY (A) 10/06/2021 1523   LABSPEC 1.019 10/06/2021 1523   PHURINE 5.0 10/06/2021 1523   GLUCOSEU NEGATIVE 10/06/2021 1523   GLUCOSEU NEGATIVE 08/03/2019 1006   HGBUR LARGE (A) 10/06/2021 1523   BILIRUBINUR NEGATIVE 10/06/2021 1523   KETONESUR NEGATIVE 10/06/2021 1523   PROTEINUR 100 (A) 10/06/2021 1523   UROBILINOGEN 0.2 08/03/2019 1006   NITRITE NEGATIVE 10/06/2021 1523   LEUKOCYTESUR TRACE (A) 10/06/2021 1523   Sepsis Labs: @LABRCNTIP (procalcitonin:4,lacticidven:4) ) Recent Results (from the past 240 hour(s))  Resp Panel by RT-PCR (Flu A&B, Covid) Nasopharyngeal Swab     Status: None   Collection Time: 10/18/21 12:00 PM   Specimen: Nasopharyngeal Swab; Nasopharyngeal(NP) swabs in vial transport medium  Result Value Ref Range Status   SARS Coronavirus 2 by RT PCR NEGATIVE NEGATIVE Final    Comment: (NOTE) SARS-CoV-2 target nucleic acids are NOT DETECTED.  The SARS-CoV-2 RNA is generally detectable in upper respiratory specimens during the acute phase of infection. The lowest concentration of SARS-CoV-2 viral copies this assay can detect is 138 copies/mL. A negative result does not preclude SARS-Cov-2 infection and should not be used as the sole basis for treatment or other patient management decisions. A negative result may occur with  improper specimen collection/handling, submission of specimen other than nasopharyngeal swab,  presence of viral mutation(s) within the areas targeted by this assay, and inadequate number of viral copies(<138 copies/mL). A negative result must be combined with clinical observations, patient history, and epidemiological information. The expected result is Negative.  Fact Sheet for Patients:  EntrepreneurPulse.com.au  Fact Sheet for Healthcare Providers:  IncredibleEmployment.be  This test is no t yet approved or cleared by the [place] FDA and  has been authorized for detection and/or diagnosis of SARS-CoV-2 by FDA under an Emergency Use Authorization (EUA). This EUA will remain  in effect (meaning this test can be used) for the duration of the COVID-19 declaration under Section 564(b)(1) of the Act, 21 U.S.C.section 360bbb-3(b)(1), unless the authorization is terminated  or revoked sooner.       Influenza A by PCR NEGATIVE NEGATIVE Final   Influenza B by PCR NEGATIVE NEGATIVE Final    Comment: (NOTE) The Xpert Xpress SARS-CoV-2/FLU/RSV plus assay is intended as an aid in the diagnosis of influenza from Nasopharyngeal swab specimens and should not be used as a sole basis for treatment. Nasal washings and aspirates are unacceptable for Xpert Xpress SARS-CoV-2/FLU/RSV testing.  Fact Sheet for Patients: EntrepreneurPulse.com.au  Fact Sheet for Healthcare Providers: IncredibleEmployment.be  This test is not yet approved or cleared by the [place] FDA and has been authorized for detection and/or diagnosis of SARS-CoV-2 by FDA under an Emergency Use Authorization (EUA). This EUA will remain in effect (meaning this test can be used) for the duration of the COVID-19 declaration under Section 564(b)(1) of the Act, 21 U.S.C. section 360bbb-3(b)(1), unless the authorization is terminated or  revoked.  Performed at [facility], [place] [address], [place], [place] [address]      Radiological  Exams on Admission: CT HEAD WO CONTRAST (5MM)  Result Date: 10/21/2021 CLINICAL DATA:  Headache, dizziness, weakness EXAM: CT HEAD WITHOUT CONTRAST TECHNIQUE: Contiguous axial images were obtained from the base of the skull through the vertex without intravenous contrast. RADIATION DOSE REDUCTION: This exam was performed according to the departmental dose-optimization program which includes automated exposure control, adjustment of the mA and/or kV according to patient size and/or use of iterative reconstruction technique. COMPARISON:  10/07/2021, 10/11/2021 FINDINGS: Brain: Hypodensities within the left frontal parietal periventricular white matter consistent with areas of acute infarct seen on previous MRI. No new infarct or hemorrhage. Lateral ventricles and midline structures are unremarkable. No acute extra-axial fluid collections. No mass effect. Vascular: No hyperdense vessel or unexpected calcification. Stable atherosclerosis. Skull: Normal. Negative for fracture or focal lesion. Sinuses/Orbits: No acute finding. Other: None. IMPRESSION: 1. Subacute small vessel ischemic changes within the left frontal parietal white matter, corresponding to previous MRI findings. 2. No acute infarct or hemorrhage. Electronically Signed   By: Randa Ngo M.D.   On: 10/21/2021 19:11   DG Chest Portable 1 View  Result Date: 10/21/2021 CLINICAL DATA:  61 year old male with history of shortness of breath, dizziness and weakness. EXAM: PORTABLE CHEST 1 VIEW COMPARISON:  Chest x-ray 10/09/2021. FINDINGS: Bibasilar opacities may reflect areas of atelectasis and/or consolidation, with superimposed small bilateral pleural effusions. No pneumothorax. No evidence of pulmonary edema. Heart size is mildly enlarged. Upper mediastinal contours are within normal limits. Status post median sternotomy for aortic valve replacement with what appears to be a stented bioprosthesis. IMPRESSION: 1. Bibasilar areas of atelectasis and/or  consolidation with superimposed small bilateral pleural effusions. Electronically Signed   By: Vinnie Langton M.D.   On: 10/21/2021 12:09    EKG: Independently reviewed. Sinus rhythm, LVH with repolarization abnormality.   Assessment/Plan   1. Hypertensive urgency  - Presents with intermittent dizziness and nausea that patient attributes to severely elevated BPs and found to have BP as high as 204/119 in ED  - He had not taken any of his antihypertensives yet today  - No evidence for end-organ damage; improved with IV labetalol x1 in ED - Resume his oral hypertensives now (lisinopril, amlodipine, metoprolol, hydralazine), give additional oral hydralazine as-needed, and consider increasing scheduled hydralazine or metoprolol dose based on BP and HR trend    2. CAD s/p CABG; AS s/p AVR  - S/p LIMA to LAD and AVR on 10/05/21  - Mild flat elevation in troponin and elevated BNP in ED without angina or overt CHF; ED discussed with cardiology and enzyme elevations attributed to recent surgery    3. Hx of CVA  - Suffered embolic CVA during recent admission in setting of post-operative atrial fibrillation and was seen by neurology who recommended warfarin and increasing statin intensity   - Continue warfarin, Lipitor 80 mg   4. Atrial fibrillation  - Started on warfarin for post-operative a fib with embolic stroke during recent admission - Continue warfarin with pharmacy assistance, continue metoprolol    5. CKD IIIb; renal transplant recipient  - SCr is 2.04 on admission, improved from recent admission  - Renally-dose medications, continue Cellcept and prednisone 5 mg qD   6. Insulin-dependent DM  - A1c was 7.1% in Dec 2022  - Continue CBG checks and insulin    7. OSA  - Continue BiPAP qHS  8. Hypothyroidism  - Continue Synthroid   9. Anxiety  - Pt complains of anxiety since recent hospitalization, likely contributing to elevated BP  - He reports taking Xanax during recent  hospitalization but has been trying to manage without medications  - Continue non-pharmacologic interventions, use low-dose Xanax as needed    DVT prophylaxis: warfarin  Code Status: Full  Level of Care: Level of care: Telemetry Cardiac Family Communication: Wife at bedside  Disposition Plan:  Patient is from: SNF  Anticipated d/c is to: SNF  Anticipated d/c date is: 10/22/21  Patient currently: Pending BP-control  Consults called: none  Admission status: Observation     Vianne Bulls, MD [facility]  10/21/2021, 9:12 PM

## 2021-10-21 NOTE — ED Provider Notes (Signed)
[facility] EMERGENCY DEPARTMENT Provider Note   CSN: [id number] Arrival date & time: 10/21/21  1027     History Chief Complaint  Patient presents with   Weakness    ANGUEL DELAPENA is a 61 y.o. male who is status post CABG and aortic valve replacement 2 weeks ago, renal transplant on immunosuppressive therapy who presents to the emergency department today for dizziness, lightheadedness, and shortness of breath that occurred this morning.  Patient states he woke up around 6 AM and upon getting out of bed he became profoundly dizzy.  He tried contacting the nurses station but was unable to get through to them.  He was brought in by EMS for further evaluation.  Patient does state he has been having trouble with his blood pressures since the surgery and this does feel similar to when his blood pressures are high.  He denies any chest pain, abdominal pain, nausea, vomiting, diarrhea, leg swelling.  Currently he states that shortness of breath and dizziness has improved since being in the department.   Weakness     Home Medications Prior to Admission medications   Medication Sig Start Date End Date Taking? Authorizing Provider  acetaminophen (TYLENOL) 325 MG tablet Take 2 tablets (650 mg total) by mouth every 4 (four) hours as needed for moderate pain. 10/15/21   Nani Skillern, PA-C  amiodarone (PACERONE) 200 MG tablet Take 1 tablet (200 mg total) by mouth 2 (two) times daily. 10/18/21   Gold, Wayne E, PA-C  amLODipine (NORVASC) 10 MG tablet TAKE 1 TABLET BY MOUTH EVERY DAY 09/11/21   Troy Sine, MD  amoxicillin (AMOXIL) 500 MG capsule Take 2,000 mg by mouth See admin instructions. Take 4 capsules (2000 mg) by mouth 1 hour prior to dental appointments. 08/13/21   [provider]  atorvastatin (LIPITOR) 80 MG tablet Take 1 tablet (80 mg total) by mouth daily. 10/19/21   Gold, Wilder Glade, PA-C  belatacept (NULOJIX) 250 MG SOLR injection Inject 24.5 mLs into the  vein every 28 (twenty-eight) days.  08/30/17   [provider]  cholecalciferol (VITAMIN D3) 25 MCG (1000 UNIT) tablet Take 1,000 Units by mouth in the morning and at bedtime.    [provider]  DULoxetine (CYMBALTA) 60 MG capsule Take 60 mg by mouth daily. 07/19/20   [provider]  fluocinonide cream (LIDEX) 2.42 % Apply 1 application topically daily as needed (Skin inflammation (hand, thigh, back)).     [provider]  gabapentin (NEURONTIN) 100 MG capsule Take 200-300 mg by mouth See admin instructions. Take 2 capsules (200 mg) by mouth in the morning and take 3 capsules (300 mg) by mouth at bedtime 06/02/16   [provider]  hydrALAZINE (APRESOLINE) 25 MG tablet Take 1 tablet (25 mg total) by mouth 3 (three) times daily. 10/18/21   Gold, Wayne E, PA-C  insulin aspart (NOVOLOG) 100 UNIT/ML injection Inject 0-9 Units into the skin 3 (three) times daily with meals. 10/18/21   Gold, Wayne E, PA-C  insulin detemir (LEVEMIR) 100 UNIT/ML injection Inject 0.38 mLs (38 Units total) into the skin 2 (two) times daily. 10/18/21   Gold, Wilder Glade, PA-C  lactulose (CHRONULAC) 10 GM/15ML solution Take 10-20 g by mouth daily as needed (constipation). 02/23/15   [provider]  levothyroxine (SYNTHROID) 100 MCG tablet TAKE 1 TABLET BY MOUTH EVERY DAY 06/13/21   Elayne Snare, MD  lisinopril (ZESTRIL) 40 MG tablet Take 1 tablet (40 mg total) by  mouth daily. 10/19/21   Gold, Patrick Jupiter E, PA-C  metoprolol tartrate (LOPRESSOR) 50 MG tablet Take 1 tablet (50 mg total) by mouth 2 (two) times daily. 06/16/21   Troy Sine, MD  mycophenolate (CELLCEPT) 250 MG capsule Take 1,000 mg by mouth every 12 (twelve) hours.     [provider]  oxyCODONE (OXY IR/ROXICODONE) 5 MG immediate release tablet Take 1 tablet (5 mg total) by mouth every 6 (six) hours as needed for up to 7 days for severe pain. 10/18/21 10/25/21  Gold, Wilder Glade, PA-C  predniSONE (DELTASONE) 5 MG tablet Take  5 mg by mouth daily with breakfast.    [provider]  senna-docusate (SENOKOT-S) 8.6-50 MG tablet Take 1 tablet by mouth as needed for mild constipation.    [provider]  tamsulosin (FLOMAX) 0.4 MG CAPS capsule Take 0.8 mg by mouth at bedtime.     [provider]  testosterone cypionate (DEPOTESTOSTERONE CYPIONATE) 200 MG/ML injection Inject 0.6 mLs (120 mg total) into the muscle every 7 (seven) days. 07/10/21   Elayne Snare, MD  traZODone (DESYREL) 50 MG tablet Take 50-100 mg by mouth at bedtime as needed for sleep.  08/18/16   [provider]  warfarin (COUMADIN) 2.5 MG tablet Take 1 tablet (2.5 mg total) by mouth daily at 4 PM. 10/18/21   Gold, Patrick Jupiter E, PA-C  zolpidem (AMBIEN) 10 MG tablet Take 10 mg by mouth at bedtime as needed for sleep.    [provider]      Allergies    Patient has no allergy information on record.    Review of Systems   Review of Systems  Neurological:  Positive for weakness.  All other systems reviewed and are negative.  Physical Exam Updated Vital Signs BP (!) 185/72 (BP Location: Left Arm)    Pulse 65    Temp 98.2 F (36.8 C) (Oral)    Resp 17    Ht 5\' 11"  (1.803 m)    Wt 122.5 kg    SpO2 98%    BMI 37.66 kg/m  Physical Exam Vitals and nursing note reviewed.  Constitutional:      General: He is not in acute distress.    Appearance: Normal appearance.  HENT:     Head: Normocephalic and atraumatic.  Eyes:     General:        Right eye: No discharge.        Left eye: No discharge.  Cardiovascular:     Comments: Regular rate and rhythm.  S1/S2 are distinct without any evidence of murmur, rubs, or gallops.  Radial pulses are 2+ bilaterally.  Dorsalis pedis pulses are 2+ bilaterally.  No evidence of pedal edema. Pulmonary:     Comments: Clear to auscultation bilaterally.  Normal effort.  No respiratory distress.  No evidence of wheezes, rales, or rhonchi heard throughout. Abdominal:     General: Abdomen is  flat. Bowel sounds are normal. There is no distension.     Tenderness: There is no abdominal tenderness. There is no guarding or rebound.  Musculoskeletal:        General: Normal range of motion.     Cervical back: Neck supple.  Skin:    General: Skin is warm and dry.     Findings: No rash.  Neurological:     General: No focal deficit present.     Mental Status: He is alert.  Psychiatric:        Mood and Affect: Mood normal.  Behavior: Behavior normal.    ED Results / Procedures / Treatments   Labs (all labs ordered are listed, but only abnormal results are displayed) Labs Reviewed  COMPREHENSIVE METABOLIC PANEL  CBC WITH DIFFERENTIAL/PLATELET  PROTIME-INR  TROPONIN I (HIGH SENSITIVITY)    EKG EKG Interpretation  Date/Time:  Saturday October 21 2021 10:36:44 EST Ventricular Rate:  66 PR Interval:  162 QRS Duration: 116 QT Interval:  442 QTC Calculation: 464 R Axis:   16 Text Interpretation: Sinus rhythm LVH with secondary repolarization abnormality Anterior Q waves, possibly due to LVH No significant change since last tracing Confirmed by Isla Pence [phone]) on 10/21/2021 11:06:43 AM  Radiology No results found.  Procedures Procedures  Cardiac monitoring did show normal sinus rhythm and he was oxygenating well on room air with good respirations.  Did show hypertension.  Medications Ordered in ED Medications - No data to display  ED Course/ Medical Decision Making/ A&P                           Medical Decision Making  VIN YONKE is a 61 y.o. male with significant morbidities affecting his care to include recent CABG and aortic valve replacement, renal transplant on immunosuppression who presents the emergency department with dizziness and shortness of breath.  Given his cardiac history, ACS is of concern.  I did consider however believe less likely pulmonary embolism patient is currently anticoagulated on Coumadin.  He has not missed any doses.  I  will check labs, EKG, and chest x-ray.  Percent of the chest x-ray we did show possible bilateral effusions.  I do agree with the radiologist interpretation.  We have been unable to get an IV started.  IV team was consulted and is still waiting for access.  This time, it is unclear exactly what is going on.  Rest of his care is transferred to [facility].  Final Clinical Impression(s) / ED Diagnoses Final diagnoses:  None    Rx / DC Orders ED Discharge Orders     None         Hendricks Limes, [place] 10/21/21 1615    Isla Pence, MD 10/22/21 1230

## 2021-10-22 DIAGNOSIS — I6310 Cerebral infarction due to embolism of unspecified precerebral artery: Secondary | ICD-10-CM | POA: Diagnosis not present

## 2021-10-22 DIAGNOSIS — E1169 Type 2 diabetes mellitus with other specified complication: Secondary | ICD-10-CM | POA: Diagnosis not present

## 2021-10-22 DIAGNOSIS — I350 Nonrheumatic aortic (valve) stenosis: Secondary | ICD-10-CM | POA: Diagnosis not present

## 2021-10-22 DIAGNOSIS — I160 Hypertensive urgency: Secondary | ICD-10-CM | POA: Diagnosis not present

## 2021-10-22 LAB — CBC
HCT: 38.3 % — ABNORMAL LOW (ref 39.0–52.0)
Hemoglobin: 12.5 g/dL — ABNORMAL LOW (ref 13.0–17.0)
MCH: 28.8 pg (ref 26.0–34.0)
MCHC: 32.6 g/dL (ref 30.0–36.0)
MCV: 88.2 fL (ref 80.0–100.0)
Platelets: 338 10*3/uL (ref 150–400)
RBC: 4.34 MIL/uL (ref 4.22–5.81)
RDW: 14.6 % (ref 11.5–15.5)
WBC: 9.6 10*3/uL (ref 4.0–10.5)
nRBC: 0 % (ref 0.0–0.2)

## 2021-10-22 LAB — CBG MONITORING, ED
Glucose-Capillary: 169 mg/dL — ABNORMAL HIGH (ref 70–99)
Glucose-Capillary: 199 mg/dL — ABNORMAL HIGH (ref 70–99)
Glucose-Capillary: 287 mg/dL — ABNORMAL HIGH (ref 70–99)

## 2021-10-22 LAB — BASIC METABOLIC PANEL
Anion gap: 9 (ref 5–15)
BUN: 31 mg/dL — ABNORMAL HIGH (ref 8–23)
CO2: 23 mmol/L (ref 22–32)
Calcium: 9.5 mg/dL (ref 8.9–10.3)
Chloride: 105 mmol/L (ref 98–111)
Creatinine, Ser: 2.06 mg/dL — ABNORMAL HIGH (ref 0.61–1.24)
GFR, Estimated: 36 mL/min — ABNORMAL LOW (ref >60)
Glucose, Bld: 168 mg/dL — ABNORMAL HIGH (ref 70–99)
Potassium: 4.2 mmol/L (ref 3.5–5.1)
Sodium: 137 mmol/L (ref 135–145)

## 2021-10-22 LAB — GLUCOSE, CAPILLARY
Glucose-Capillary: 246 mg/dL — ABNORMAL HIGH (ref 70–99)
Glucose-Capillary: 246 mg/dL — ABNORMAL HIGH (ref 70–99)

## 2021-10-22 LAB — HIV ANTIBODY (ROUTINE TESTING W REFLEX): HIV Screen 4th Generation wRfx: NONREACTIVE

## 2021-10-22 LAB — PROTIME-INR
INR: 1.3 — ABNORMAL HIGH (ref 0.8–1.2)
Prothrombin Time: 15.8 seconds — ABNORMAL HIGH (ref 11.4–15.2)

## 2021-10-22 LAB — MAGNESIUM: Magnesium: 1.8 mg/dL (ref 1.7–2.4)

## 2021-10-22 MED ORDER — ALPRAZOLAM 0.25 MG PO TABS
0.2500 mg | ORAL_TABLET | Freq: Three times a day (TID) | ORAL | Status: DC | PRN
  Administered 2021-10-22 – 2021-10-25 (×9): 0.25 mg via ORAL
  Filled 2021-10-22 (×10): qty 1

## 2021-10-22 MED ORDER — ORAL CARE MOUTH RINSE
15.0000 mL | Freq: Two times a day (BID) | OROMUCOSAL | Status: DC
  Administered 2021-10-22 – 2021-10-26 (×7): 15 mL via OROMUCOSAL

## 2021-10-22 MED ORDER — ENOXAPARIN SODIUM 120 MG/0.8ML IJ SOSY
120.0000 mg | PREFILLED_SYRINGE | Freq: Two times a day (BID) | INTRAMUSCULAR | Status: DC
  Administered 2021-10-22 – 2021-10-26 (×8): 120 mg via SUBCUTANEOUS
  Filled 2021-10-22 (×8): qty 0.8

## 2021-10-22 MED ORDER — ALPRAZOLAM 0.25 MG PO TABS
0.2500 mg | ORAL_TABLET | Freq: Once | ORAL | Status: AC
  Administered 2021-10-22: 0.25 mg via ORAL
  Filled 2021-10-22: qty 1

## 2021-10-22 MED ORDER — WARFARIN SODIUM 2 MG PO TABS
4.0000 mg | ORAL_TABLET | Freq: Once | ORAL | Status: AC
  Administered 2021-10-22: 4 mg via ORAL
  Filled 2021-10-22: qty 1
  Filled 2021-10-22: qty 2

## 2021-10-22 NOTE — Progress Notes (Signed)
PROGRESS NOTE    Robert [name]  IRJ:[id number] DOB: 1961/07/28 DOA: 10/21/2021 PCP: Haywood Pao, MD  Brief Narrative:  The patient is a pleasant 61 year old obese Caucasian male with a past medical history significant for but not limited to insulin-dependent diabetes mellitus type 2, history of renal transplant recipient with CKD stage IIIb, hypertension, hypothyroidism, with recent CAD and severe aortic stenosis status post CABG and AVR on 10/05/2021, postoperative atrial fibrillation and embolic CVA now on warfarin who presents from his SNF for evaluation of dizziness, nausea and elevated blood pressure.  The patient had reported that he was having severe episodes of hypotension during his recent hospitalization and would experience dizzy sensation, nausea and mild headaches with some of these episodes.  His blood pressure medications were adjusted during the hospitalization but he continued these at the rehab facility initiated clonidine during 1 recent episodes but otherwise reports that he has not been any changes to his medications at discharge.  In particular episode yesterday morning and was sent to the ED and not taking any of his antihypertensives.  His chronic leg swelling but is currently getting better.  Upon arrival to ED he is found to have a blood pressure as high of 204/119.  EKG showed sinus rhythm with LVH and repolarization abnormalities.  Chest x-ray showed bibasilar atelectasis and consolidation with small bilateral pleural effusions.  Head CT was notable for a subacute small vessel ischemic changes consistent with prior MRI but without any acute findings.  The EDP discussed with cardiology and the patient was given IV labetalol and acetaminophen.  Currently his blood pressure getting better but his INR remains subtherapeutic.  Assessment & Plan:   Principal Problem:   Hypertensive urgency Active Problems:   Diabetes mellitus type 2 in obese (HCC)   Severe obstructive  sleep apnea   Hypothyroidism   Stage 3b chronic kidney disease (CKD) (HCC)   Immunosuppressed status (HCC)   Aortic valve stenosis   S/P AVR (aortic valve replacement)   Cerebrovascular accident (CVA) due to embolism of precerebral artery (HCC)  Hypertensive urgency  - Presents with intermittent dizziness and nausea that patient attributes to severely elevated BPs and found to have BP as high as 204/119 in ED  - He had not taken any of his antihypertensives yet today  - No evidence for end-organ damage; improved with IV labetalol x1 in ED - Resume his oral hypertensives now (lisinopril, amlodipine, metoprolol, hydralazine), give additional oral hydralazine as-needed, and consider increasing scheduled hydralazine or metoprolol dose based on BP and HR trend   -Continue monitor blood pressures per protocol and if necessary will discuss with cardiology -Last blood pressure reading was 172/76   CAD s/p CABG; AS s/p AVR  - S/p LIMA to LAD and AVR on 10/05/21  - Mild flat elevation in troponin and elevated BNP in ED without angina or overt CHF; ED discussed with cardiology and enzyme elevations attributed to recent surgery   -Continue to monitor and trend and follow-up   Hx of recent CVA  - Suffered embolic CVA during recent admission in setting of post-operative atrial fibrillation and was seen by neurology who recommended warfarin and increasing statin intensity   - Continue warfarin, Lipitor 80 mg  -We will likely bridge with a Lovenox bridge given his subtherapeutic INR  Atrial fibrillation  - Started on warfarin for post-operative a fib with embolic stroke during recent admission - Continue warfarin with pharmacy assistance, continue metoprolol     CKD IIIb; renal  transplant recipient  - SCr is 2.04 on admission, improved from recent admission; BUN/creatinine is now 31/2.06 - Renally-dose medications, continue Cellcept and prednisone 5 mg qD  -Avoid further nephrotoxic medications,  contrast dyes, hypotension renally dose medications   Insulin-dependent DM  - A1c was 7.1% in Dec 2022  -Continue CBG checks and insulin   -CBG's ranging from 169-287   OSA  - Continue BiPAP qHS     Hypothyroidism  - Continue Synthroid    Anxiety  - Pt complains of anxiety since recent hospitalization, likely contributing to elevated BP  - He reports taking Xanax during recent hospitalization but has been trying to manage without medications  - Continue non-pharmacologic interventions, use low-dose Xanax as needed    DVT prophylaxis: Anticoagulated with Coumadin and will initiate Lovenox bridge Code Status: FULL CODE Family Communication: No family currently present at bedside Disposition Plan: Anticipating discharging to SNF  Status is: Observation  The patient will require care spanning > 2 midnights and should be moved to inpatient because: His BP needs to improve and will need an INR Closer to becoming Therapeutic    Consultants:  EDP discussed with Cardiology  Procedures: None  Antimicrobials:  Anti-infectives (From admission, onward)    None        Subjective: Seen and examined at bedside and the patient was feeling better today.  Wearing his CPAP this morning.  No nausea or vomiting.  Denies any lightheadedness or dizziness this AM.  No other concerns or complaints at that time  Objective: Vitals:   10/22/21 1300 10/22/21 1400 10/22/21 1607 10/22/21 1700  BP: (!) 165/88 (!) 146/70 (!) 164/78 (!) 172/76  Pulse: 66 65  66  Resp: 16 19  (!) 23  Temp:      TempSrc:      SpO2: 96% 97%  98%  Weight:      Height:        Intake/Output Summary (Last 24 hours) at 10/22/2021 1814 Last data filed at 10/22/2021 1430 Gross per 24 hour  Intake --  Output 1300 ml  Net -1300 ml   Filed Weights   10/21/21 1036  Weight: 122.5 kg   Examination: Physical Exam:  Constitutional: WN/WD obese Caucasian male currently in NAD and appears calm  Eyes: Lids and  conjunctivae normal, sclerae anicteric  ENMT: External Ears, Nose appear normal. Grossly normal hearing. Mucous membranes are moist.  Neck: Appears normal, supple, no cervical masses, normal ROM, no appreciable thyromegaly; no appreciable JVD Respiratory: Diminished to auscultation bilaterally, no wheezing, rales, rhonchi or crackles. Normal respiratory effort and patient is not tachypenic. No accessory muscle use.  Not wearing any supplemental oxygen via nasal cannula Cardiovascular: RRR, no murmurs / rubs / gallops. S1 and S2 auscultated.  Trace extremity edema; his midsternal incision appears clean dry and intact and healing quite well Abdomen: Soft, non-tender, distended secondary body habitus.  Bowel sounds positive.  GU: Deferred. Musculoskeletal: No clubbing / cyanosis of digits/nails. No joint deformity upper and lower extremities. Skin: No rashes, lesions, ulcers on limited skin evaluation. No induration; Warm and dry.  Neurologic: CN 2-12 grossly intact with no focal deficits. Romberg sign and cerebellar reflexes not assessed.  Psychiatric: Normal judgment and insight. Alert and oriented x 3. Normal mood and appropriate affect.   Data Reviewed: I have personally reviewed following labs and imaging studies  CBC: Recent Labs  Lab 10/16/21 0228 10/17/21 0341 10/18/21 0643 10/21/21 1634 10/22/21 0411  WBC 10.0 9.4 9.1 8.4 9.6  NEUTROABS  --   --   --  6.7  --   HGB 11.9* 13.5 12.4* 12.3* 12.5*  HCT 38.0* 42.5 40.1 38.7* 38.3*  MCV 90.5 89.3 90.7 88.4 88.2  PLT 377 409* 408* 376 387   Basic Metabolic Panel: Recent Labs  Lab 10/21/21 1634 10/22/21 0411  NA 133* 137  K 3.9 4.2  CL 105 105  CO2 21* 23  GLUCOSE 214* 168*  BUN 33* 31*  CREATININE 2.03* 2.06*  CALCIUM 9.1 9.5  MG  --  1.8   GFR: Estimated Creatinine Clearance: 49.5 mL/min (A) (by C-G formula based on SCr of 2.06 mg/dL (H)). Liver Function Tests: Recent Labs  Lab 10/21/21 1634  AST 18  ALT 23   ALKPHOS 102  BILITOT 0.8  PROT 5.6*  ALBUMIN 3.3*   No results for input(s): LIPASE, AMYLASE in the last 168 hours. No results for input(s): AMMONIA in the last 168 hours. Coagulation Profile: Recent Labs  Lab 10/16/21 0228 10/17/21 0341 10/18/21 0643 10/21/21 1634 10/22/21 0411  INR 2.8* 2.3* 1.9* 1.3* 1.3*   Cardiac Enzymes: No results for input(s): CKTOTAL, CKMB, CKMBINDEX, TROPONINI in the last 168 hours. BNP (last 3 results) No results for input(s): PROBNP in the last 8760 hours. HbA1C: No results for input(s): HGBA1C in the last 72 hours. CBG: Recent Labs  Lab 10/18/21 1610 10/21/21 2228 10/22/21 0722 10/22/21 1212 10/22/21 1613  GLUCAP 154* 204* 169* 199* 287*   Lipid Profile: No results for input(s): CHOL, HDL, LDLCALC, TRIG, CHOLHDL, LDLDIRECT in the last 72 hours. Thyroid Function Tests: No results for input(s): TSH, T4TOTAL, FREET4, T3FREE, THYROIDAB in the last 72 hours. Anemia Panel: No results for input(s): VITAMINB12, FOLATE, FERRITIN, TIBC, IRON, RETICCTPCT in the last 72 hours. Sepsis Labs: No results for input(s): PROCALCITON, LATICACIDVEN in the last 168 hours.  Recent Results (from the past 240 hour(s))  Resp Panel by RT-PCR (Flu A&B, Covid) Nasopharyngeal Swab     Status: None   Collection Time: 10/18/21 12:00 PM   Specimen: Nasopharyngeal Swab; Nasopharyngeal(NP) swabs in vial transport medium  Result Value Ref Range Status   SARS Coronavirus 2 by RT PCR NEGATIVE NEGATIVE Final    Comment: (NOTE) SARS-CoV-2 target nucleic acids are NOT DETECTED.  The SARS-CoV-2 RNA is generally detectable in upper respiratory specimens during the acute phase of infection. The lowest concentration of SARS-CoV-2 viral copies this assay can detect is 138 copies/mL. A negative result does not preclude SARS-Cov-2 infection and should not be used as the sole basis for treatment or other patient management decisions. A negative result may occur with  improper  specimen collection/handling, submission of specimen other than nasopharyngeal swab, presence of viral mutation(s) within the areas targeted by this assay, and inadequate number of viral copies(<138 copies/mL). A negative result must be combined with clinical observations, patient history, and epidemiological information. The expected result is Negative.  Fact Sheet for Patients:  EntrepreneurPulse.com.au  Fact Sheet for Healthcare Providers:  IncredibleEmployment.be  This test is no t yet approved or cleared by the [place] FDA and  has been authorized for detection and/or diagnosis of SARS-CoV-2 by FDA under an Emergency Use Authorization (EUA). This EUA will remain  in effect (meaning this test can be used) for the duration of the COVID-19 declaration under Section 564(b)(1) of the Act, 21 U.S.C.section 360bbb-3(b)(1), unless the authorization is terminated  or revoked sooner.       Influenza A by PCR NEGATIVE NEGATIVE Final   Influenza  B by PCR NEGATIVE NEGATIVE Final    Comment: (NOTE) The Xpert Xpress SARS-CoV-2/FLU/RSV plus assay is intended as an aid in the diagnosis of influenza from Nasopharyngeal swab specimens and should not be used as a sole basis for treatment. Nasal washings and aspirates are unacceptable for Xpert Xpress SARS-CoV-2/FLU/RSV testing.  Fact Sheet for Patients: EntrepreneurPulse.com.au  Fact Sheet for Healthcare Providers: IncredibleEmployment.be  This test is not yet approved or cleared by the [place] FDA and has been authorized for detection and/or diagnosis of SARS-CoV-2 by FDA under an Emergency Use Authorization (EUA). This EUA will remain in effect (meaning this test can be used) for the duration of the COVID-19 declaration under Section 564(b)(1) of the Act, 21 U.S.C. section 360bbb-3(b)(1), unless the authorization is terminated or revoked.  Performed at  [facility], [place] [address], [place], [place] [address]   Resp Panel by RT-PCR (Flu A&B, Covid) Nasopharyngeal Swab     Status: None   Collection Time: 10/21/21 10:39 PM   Specimen: Nasopharyngeal Swab; Nasopharyngeal(NP) swabs in vial transport medium  Result Value Ref Range Status   SARS Coronavirus 2 by RT PCR NEGATIVE NEGATIVE Final    Comment: (NOTE) SARS-CoV-2 target nucleic acids are NOT DETECTED.  The SARS-CoV-2 RNA is generally detectable in upper respiratory specimens during the acute phase of infection. The lowest concentration of SARS-CoV-2 viral copies this assay can detect is 138 copies/mL. A negative result does not preclude SARS-Cov-2 infection and should not be used as the sole basis for treatment or other patient management decisions. A negative result may occur with  improper specimen collection/handling, submission of specimen other than nasopharyngeal swab, presence of viral mutation(s) within the areas targeted by this assay, and inadequate number of viral copies(<138 copies/mL). A negative result must be combined with clinical observations, patient history, and epidemiological information. The expected result is Negative.  Fact Sheet for Patients:  EntrepreneurPulse.com.au  Fact Sheet for Healthcare Providers:  IncredibleEmployment.be  This test is no t yet approved or cleared by the [place] FDA and  has been authorized for detection and/or diagnosis of SARS-CoV-2 by FDA under an Emergency Use Authorization (EUA). This EUA will remain  in effect (meaning this test can be used) for the duration of the COVID-19 declaration under Section 564(b)(1) of the Act, 21 U.S.C.section 360bbb-3(b)(1), unless the authorization is terminated  or revoked sooner.       Influenza A by PCR NEGATIVE NEGATIVE Final   Influenza B by PCR NEGATIVE NEGATIVE Final    Comment: (NOTE) The Xpert Xpress SARS-CoV-2/FLU/RSV plus assay  is intended as an aid in the diagnosis of influenza from Nasopharyngeal swab specimens and should not be used as a sole basis for treatment. Nasal washings and aspirates are unacceptable for Xpert Xpress SARS-CoV-2/FLU/RSV testing.  Fact Sheet for Patients: EntrepreneurPulse.com.au  Fact Sheet for Healthcare Providers: IncredibleEmployment.be  This test is not yet approved or cleared by the [place] FDA and has been authorized for detection and/or diagnosis of SARS-CoV-2 by FDA under an Emergency Use Authorization (EUA). This EUA will remain in effect (meaning this test can be used) for the duration of the COVID-19 declaration under Section 564(b)(1) of the Act, 21 U.S.C. section 360bbb-3(b)(1), unless the authorization is terminated or revoked.  Performed at [facility], [place] [address], [place], [place] [address]     RN Pressure Injury Documentation:     Estimated body mass index is 37.66 kg/m as calculated from the following:  Height as of this encounter: 5\' 11"  (1.803 m).   Weight as of this encounter: 122.5 kg.  Malnutrition Type:   Malnutrition Characteristics:   Nutrition Interventions:   Radiology Studies: CT HEAD WO CONTRAST (5MM)  Result Date: 10/21/2021 CLINICAL DATA:  Headache, dizziness, weakness EXAM: CT HEAD WITHOUT CONTRAST TECHNIQUE: Contiguous axial images were obtained from the base of the skull through the vertex without intravenous contrast. RADIATION DOSE REDUCTION: This exam was performed according to the departmental dose-optimization program which includes automated exposure control, adjustment of the mA and/or kV according to patient size and/or use of iterative reconstruction technique. COMPARISON:  10/07/2021, 10/11/2021 FINDINGS: Brain: Hypodensities within the left frontal parietal periventricular white matter consistent with areas of acute infarct seen on previous MRI. No new infarct or hemorrhage.  Lateral ventricles and midline structures are unremarkable. No acute extra-axial fluid collections. No mass effect. Vascular: No hyperdense vessel or unexpected calcification. Stable atherosclerosis. Skull: Normal. Negative for fracture or focal lesion. Sinuses/Orbits: No acute finding. Other: None. IMPRESSION: 1. Subacute small vessel ischemic changes within the left frontal parietal white matter, corresponding to previous MRI findings. 2. No acute infarct or hemorrhage. Electronically Signed   By: Randa Ngo M.D.   On: 10/21/2021 19:11   DG Chest Portable 1 View  Result Date: 10/21/2021 CLINICAL DATA:  61 year old male with history of shortness of breath, dizziness and weakness. EXAM: PORTABLE CHEST 1 VIEW COMPARISON:  Chest x-ray 10/09/2021. FINDINGS: Bibasilar opacities may reflect areas of atelectasis and/or consolidation, with superimposed small bilateral pleural effusions. No pneumothorax. No evidence of pulmonary edema. Heart size is mildly enlarged. Upper mediastinal contours are within normal limits. Status post median sternotomy for aortic valve replacement with what appears to be a stented bioprosthesis. IMPRESSION: 1. Bibasilar areas of atelectasis and/or consolidation with superimposed small bilateral pleural effusions. Electronically Signed   By: Vinnie Langton M.D.   On: 10/21/2021 12:09    Scheduled Meds:  amiodarone  200 mg Oral BID   amLODipine  10 mg Oral Daily   atorvastatin  80 mg Oral Daily   DULoxetine  60 mg Oral Daily   gabapentin  200 mg Oral Daily   hydrALAZINE  25 mg Oral TID   insulin aspart  0-5 Units Subcutaneous QHS   insulin aspart  0-9 Units Subcutaneous TID WC   levothyroxine  100 mcg Oral QAC breakfast   lisinopril  40 mg Oral Daily   metoprolol tartrate  50 mg Oral BID   mycophenolate  1,000 mg Oral Q12H   predniSONE  5 mg Oral Q breakfast   sodium chloride flush  3 mL Intravenous Q12H   tamsulosin  0.8 mg Oral QHS   warfarin  4 mg Oral ONCE-1600    Warfarin - Pharmacist Dosing Inpatient   Does not apply q1600   Continuous Infusions:   LOS: 0 days   Kerney Elbe, DO [facility] PAGER is on AMION  If 7PM-7AM, please contact night-coverage [email or web address]

## 2021-10-22 NOTE — Care Management Obs Status (Signed)
[place] NOTIFICATION   Patient Details  Name: Robert [name] MRN: [id number] Date of Birth: May 20, 1961   Medicare Observation Status Notification Given:  Ernesta Amble, RN 10/22/2021, 4:55 PM

## 2021-10-22 NOTE — ED Notes (Signed)
Pt eating lunch at the bedside.

## 2021-10-22 NOTE — ED Notes (Addendum)
Pt concerned about INR 1.3. Notified attending MD.

## 2021-10-22 NOTE — Progress Notes (Signed)
Patient has home CPAP on bedside table and states that he will place mask on himself when ready. RT instructed patient to have RT called if he needs assistance. RT will monitor as needed.

## 2021-10-22 NOTE — ED Notes (Signed)
Pt states he has anxiety and wearing his cpap masks helps give some relief.

## 2021-10-22 NOTE — Evaluation (Signed)
Physical Therapy Evaluation Patient Details Name: Robert [name] MRN: [id number] DOB: October 18, 1960 Today's Date: 10/22/2021  History of Present Illness  61 y.o. male presents to [facility] hospital on 10/21/2021 with dizziness, nausea, and hypertension. PMH includes IDDM, CKD s/p renal transplant, HRTN, CAD s/p CABG 09/2022.  Clinical Impression  Pt presents to PT with deficits in activity tolerance, gait, balance, functional mobility, endurance. Pt is limited greatly by reports of dizziness and hypertension. PT notes anxiety with mobility due to dizziness and also pt reports of R knee instability. PT suggests use of breathing techniques at next session to limit anxiety with mobility. Pt will benefit from aggressive mobilization to improve activity tolerance and restore independence. PT recommends SNF placement at this time due to poor activity tolerance related to dizziness.       Recommendations for follow up therapy are one component of a multi-disciplinary discharge planning process, led by the attending physician.  Recommendations may be updated based on patient status, additional functional criteria and insurance authorization.  Follow Up Recommendations Skilled nursing-short term rehab (<3 hours/day)    Assistance Recommended at Discharge Frequent or constant Supervision/Assistance  Patient can return home with the following  A lot of help with walking and/or transfers;A lot of help with bathing/dressing/bathroom    Equipment Recommendations Rolling walker (2 wheels);Wheelchair (measurements PT);Wheelchair cushion (measurements PT);BSC/3in1  Recommendations for Other Services       Functional Status Assessment Patient has had a recent decline in their functional status and demonstrates the ability to make significant improvements in function in a reasonable and predictable amount of time.     Precautions / Restrictions Precautions Precautions: Fall;Sternal Precaution Booklet Issued:  No Precaution Comments: R arm restriction due to fistula Restrictions Weight Bearing Restrictions: No Other Position/Activity Restrictions: sternal precautions      Mobility  Bed Mobility Overal bed mobility: Needs Assistance Bed Mobility: Rolling;Sidelying to Sit;Sit to Supine Rolling: Min assist Sidelying to sit: Mod assist;HOB elevated   Sit to supine: Mod assist   General bed mobility comments: modA to elevated trunk into sitting and to elevate LEs onto bed when returning to supine    Transfers Overall transfer level: Needs assistance Equipment used: Rolling walker (2 wheels) Transfers: Sit to/from Stand Sit to Stand: Min guard           General transfer comment: 4 sit to stands during session, cues for technique to maintain sternal precautions    Ambulation/Gait Ambulation/Gait assistance: Min guard Gait Distance (Feet): 10 Feet Assistive device: Rolling walker (2 wheels) Gait Pattern/deviations: Step-through pattern Gait velocity: decr Gait velocity interpretation: <1.31 ft/sec, indicative of household ambulator   General Gait Details: pt with slowed step-through gait, reduced stride length  Stairs            Wheelchair Mobility    Modified Rankin (Stroke Patients Only)       Balance Overall balance assessment: Needs assistance Sitting-balance support: No upper extremity supported;Feet supported Sitting balance-Leahy Scale: Fair     Standing balance support: Bilateral upper extremity supported;Reliant on assistive device for balance Standing balance-Leahy Scale: Poor                               Pertinent Vitals/Pain Pain Assessment: Faces Faces Pain Scale: Hurts little more Pain Location: head Pain Descriptors / Indicators: Headache Pain Intervention(s): Monitored during session    Home Living Family/patient expects to be discharged to:: Private residence Living Arrangements: Spouse/significant other  Available Help at  Discharge: Family Type of Home: House Home Access: Stairs to enter Entrance Stairs-Rails: None Entrance Stairs-Number of Steps: 3 Alternate Level Stairs-Number of Steps: 2 flights of steps Home Layout: Two level;Able to live on main level with bedroom/bathroom Home Equipment: Shower seat - built in;Hand held shower head;Grab bars - tub/shower Additional Comments: animals- 12lb yogi the yorkie    Prior Function Prior Level of Function : Independent/Modified Independent;Working/employed;Driving             Mobility Comments: decreased sensation in feet at baseline. wife reports pt walking out of his shoe and no awareness to it coming off. Wife also reports heavy use of UE's for sit to stand and to get out of car.       Hand Dominance   Dominant Hand: Left    Extremity/Trunk Assessment   Upper Extremity Assessment Upper Extremity Assessment: Overall WFL for tasks assessed (within limits of sternal precautions)    Lower Extremity Assessment Lower Extremity Assessment: Generalized weakness;[name] deficits/detail [name] Deficits / Details: pt reports recent history of  R knee buckling [name] Sensation: history of peripheral neuropathy    Cervical / Trunk Assessment Cervical / Trunk Assessment: Other exceptions Cervical / Trunk Exceptions: sternal incision  Communication   Communication: No difficulties  Cognition Arousal/Alertness: Awake/alert Behavior During Therapy: WFL for tasks assessed/performed;Anxious Overall Cognitive Status: Within Functional Limits for tasks assessed                                          General Comments General comments (skin integrity, edema, etc.): pt with significant hypertension during session. BP 152/70 at start of session in supine. BP peak at 195/104 (126) with sit to stand and limited mobility at edge of bed. Pt reports symptoms of dizziness and later headache during session    Exercises     Assessment/Plan    PT  Assessment Patient needs continued PT services  PT Problem List Decreased strength;Decreased activity tolerance;Decreased mobility;Decreased balance;Cardiopulmonary status limiting activity;Impaired sensation       PT Treatment Interventions DME instruction;Gait training;Stair training;Functional mobility training;Therapeutic activities;Balance training;Therapeutic exercise;Neuromuscular re-education;Patient/family education    PT Goals (Current goals can be found in the Care Plan section)  Acute Rehab PT Goals Patient Stated Goal: to return to independence PT Goal Formulation: With patient Time For Goal Achievement: 11/05/21 Potential to Achieve Goals: Good    Frequency Min 3X/week     Co-evaluation               AM-PAC PT "6 Clicks" Mobility  Outcome Measure Help needed turning from your back to your side while in a flat bed without using bedrails?: A Little Help needed moving from lying on your back to sitting on the side of a flat bed without using bedrails?: A Lot Help needed moving to and from a bed to a chair (including a wheelchair)?: A Little Help needed standing up from a chair using your arms (e.g., wheelchair or bedside chair)?: A Little Help needed to walk in hospital room?: Total Help needed climbing 3-5 steps with a railing? : Total 6 Click Score: 13    End of Session   Activity Tolerance: Treatment limited secondary to medical complications (Comment) (dizziness) Patient left: in bed;with call bell/phone within reach Nurse Communication: Mobility status PT Visit Diagnosis: Other abnormalities of gait and mobility (R26.89);Dizziness and giddiness (R42)  Time: 1445-1510 PT Time Calculation (min) (ACUTE ONLY): 25 min   Charges:   PT Evaluation $PT Eval Moderate Complexity: 1 Mod PT Treatments $Therapeutic Activity: 8-22 mins        Zenaida Niece, PT, DPT Acute Rehabilitation Pager: [phone] Office [place] 10/22/2021, 4:16  PM

## 2021-10-22 NOTE — Progress Notes (Signed)
PT has own CPAP and is compliant with wearing it nightly

## 2021-10-22 NOTE — Progress Notes (Signed)
[facility] for Warfarin Indication: atrial fibrillation  No Known Allergies  Patient Measurements: Height: 5\' 11"  (180.3 cm) Weight: 122.5 kg (270 lb) IBW/kg (Calculated) : 75.3  Vital Signs: BP: 141/75 (01/15 0700) Pulse Rate: 62 (01/15 0700)  Labs: Recent Labs    10/21/21 1634 10/21/21 1811 10/22/21 0411  HGB 12.3*  --  12.5*  HCT 38.7*  --  38.3*  PLT 376  --  338  LABPROT 15.9*  --  15.8*  INR 1.3*  --  1.3*  CREATININE 2.03*  --  2.06*  TROPONINIHS 144* 154*  --      Estimated Creatinine Clearance: 49.5 mL/min (A) (by C-G formula based on SCr of 2.06 mg/dL (H)).   Medical History: Past Medical History:  Diagnosis Date   Anemia    low hgb at present   Arthritis    FEET   Blood transfusion without reported diagnosis 1974   kidney removed - L   Cancer ([place])    skin ca right shoulder, plastic dsyplasia, pre-Ca polpys removed on Colonoscopy- 07/2014   Colon polyps 07/23/2014   Tubular adenomas x 5   Constipation    Coronary artery disease    Diabetes mellitus without complication ([place])    Type 2   Difficult intubation    unsure of actual problem but it was during the January 28, 2015 procedure.   Dysrhythmia    Eczema    ESRD (end stage renal disease) ([place])    hemodialysis 06/2014-02/2017, s/p living unrelated kidney transplant 02/26/17   Headache(784.0)    slight dull h/a due kidney failure   Heart murmur    aortic stenosis (moderate-severe 04/2021)   Hyperlipidemia    Hypertension    SVT h/o , followed by Dr. Claiborne Billings   Hypothyroidism    Macular degeneration    Neuropathy    right hand and both feet   PAT (paroxysmal atrial tachycardia) ([facility])    PSVT (paroxysmal supraventricular tachycardia) ([facility])    SBO (small bowel obstruction) ([place]) 05/2015   Shortness of breath    with exertion   Sleep apnea 06/18/2011   split-night sleep study- [facility]., BiPAP- 13-16   Thyroid disease    Umbilical  hernia    s/p repair 04/28/15   Medications:  (Not in a hospital admission) Scheduled:   amiodarone  200 mg Oral BID   amLODipine  10 mg Oral Daily   atorvastatin  80 mg Oral Daily   DULoxetine  60 mg Oral Daily   gabapentin  200 mg Oral Daily   hydrALAZINE  25 mg Oral TID   insulin aspart  0-5 Units Subcutaneous QHS   insulin aspart  0-9 Units Subcutaneous TID WC   levothyroxine  100 mcg Oral QAC breakfast   lisinopril  40 mg Oral Daily   metoprolol tartrate  50 mg Oral BID   mycophenolate  1,000 mg Oral Q12H   predniSONE  5 mg Oral Q breakfast   sodium chloride flush  3 mL Intravenous Q12H   tamsulosin  0.8 mg Oral QHS   Warfarin - Pharmacist Dosing Inpatient   Does not apply q1600   Infusions:   Assessment: 10 yom s/p CABG and AVR 2 weeks ago. Warfarin per pharmacy consult placed for AF.  Home dose of warfarin is 2.5 mg daily. Patient last taken 1/13 at 1800.  PT/INR today is still 1.3  Goal of Therapy:  INR 2-2.5 (per 1/6 note by MD Bartle)  Monitor platelets by anticoagulation protocol: Yes   Plan:  Repeat  4 mg warfarin tonight, subsequent dosing per INR Monitor for DDIs Monitor CBC, Daily INR  Thank you Anette Guarneri, PharmD 10/22/2021 8:41 AM ED Clinical Pharmacist -  [phone]

## 2021-10-23 ENCOUNTER — Encounter: Payer: Self-pay | Admitting: Neurology

## 2021-10-23 DIAGNOSIS — R778 Other specified abnormalities of plasma proteins: Secondary | ICD-10-CM | POA: Diagnosis not present

## 2021-10-23 DIAGNOSIS — D849 Immunodeficiency, unspecified: Secondary | ICD-10-CM | POA: Diagnosis present

## 2021-10-23 DIAGNOSIS — I350 Nonrheumatic aortic (valve) stenosis: Secondary | ICD-10-CM

## 2021-10-23 DIAGNOSIS — Z953 Presence of xenogenic heart valve: Secondary | ICD-10-CM | POA: Diagnosis not present

## 2021-10-23 DIAGNOSIS — I248 Other forms of acute ischemic heart disease: Secondary | ICD-10-CM | POA: Diagnosis present

## 2021-10-23 DIAGNOSIS — I2510 Atherosclerotic heart disease of native coronary artery without angina pectoris: Secondary | ICD-10-CM | POA: Diagnosis present

## 2021-10-23 DIAGNOSIS — Z20822 Contact with and (suspected) exposure to covid-19: Secondary | ICD-10-CM | POA: Diagnosis present

## 2021-10-23 DIAGNOSIS — Z794 Long term (current) use of insulin: Secondary | ICD-10-CM | POA: Diagnosis not present

## 2021-10-23 DIAGNOSIS — I6310 Cerebral infarction due to embolism of unspecified precerebral artery: Secondary | ICD-10-CM | POA: Diagnosis not present

## 2021-10-23 DIAGNOSIS — I132 Hypertensive heart and chronic kidney disease with heart failure and with stage 5 chronic kidney disease, or end stage renal disease: Secondary | ICD-10-CM | POA: Diagnosis present

## 2021-10-23 DIAGNOSIS — R0602 Shortness of breath: Secondary | ICD-10-CM | POA: Diagnosis present

## 2021-10-23 DIAGNOSIS — Z7960 Long term (current) use of unspecified immunomodulators and immunosuppressants: Secondary | ICD-10-CM | POA: Diagnosis not present

## 2021-10-23 DIAGNOSIS — Q256 Stenosis of pulmonary artery: Secondary | ICD-10-CM | POA: Diagnosis not present

## 2021-10-23 DIAGNOSIS — N1832 Chronic kidney disease, stage 3b: Secondary | ICD-10-CM

## 2021-10-23 DIAGNOSIS — D84821 Immunodeficiency due to drugs: Secondary | ICD-10-CM | POA: Diagnosis present

## 2021-10-23 DIAGNOSIS — I77810 Thoracic aortic ectasia: Secondary | ICD-10-CM | POA: Diagnosis present

## 2021-10-23 DIAGNOSIS — I480 Paroxysmal atrial fibrillation: Secondary | ICD-10-CM | POA: Diagnosis present

## 2021-10-23 DIAGNOSIS — E1169 Type 2 diabetes mellitus with other specified complication: Secondary | ICD-10-CM | POA: Diagnosis not present

## 2021-10-23 DIAGNOSIS — I160 Hypertensive urgency: Principal | ICD-10-CM

## 2021-10-23 DIAGNOSIS — F410 Panic disorder [episodic paroxysmal anxiety] without agoraphobia: Secondary | ICD-10-CM | POA: Diagnosis present

## 2021-10-23 DIAGNOSIS — Z7901 Long term (current) use of anticoagulants: Secondary | ICD-10-CM | POA: Diagnosis not present

## 2021-10-23 DIAGNOSIS — Z951 Presence of aortocoronary bypass graft: Secondary | ICD-10-CM | POA: Diagnosis not present

## 2021-10-23 DIAGNOSIS — D631 Anemia in chronic kidney disease: Secondary | ICD-10-CM | POA: Diagnosis present

## 2021-10-23 DIAGNOSIS — E785 Hyperlipidemia, unspecified: Secondary | ICD-10-CM | POA: Diagnosis present

## 2021-10-23 DIAGNOSIS — N186 End stage renal disease: Secondary | ICD-10-CM | POA: Diagnosis present

## 2021-10-23 DIAGNOSIS — E039 Hypothyroidism, unspecified: Secondary | ICD-10-CM | POA: Diagnosis present

## 2021-10-23 DIAGNOSIS — Z952 Presence of prosthetic heart valve: Secondary | ICD-10-CM

## 2021-10-23 DIAGNOSIS — E669 Obesity, unspecified: Secondary | ICD-10-CM | POA: Diagnosis present

## 2021-10-23 DIAGNOSIS — R0609 Other forms of dyspnea: Secondary | ICD-10-CM | POA: Diagnosis not present

## 2021-10-23 DIAGNOSIS — E872 Acidosis, unspecified: Secondary | ICD-10-CM | POA: Diagnosis present

## 2021-10-23 DIAGNOSIS — Z940 Kidney transplant status: Secondary | ICD-10-CM | POA: Diagnosis not present

## 2021-10-23 DIAGNOSIS — G4733 Obstructive sleep apnea (adult) (pediatric): Secondary | ICD-10-CM | POA: Diagnosis present

## 2021-10-23 DIAGNOSIS — E1122 Type 2 diabetes mellitus with diabetic chronic kidney disease: Secondary | ICD-10-CM | POA: Diagnosis present

## 2021-10-23 LAB — COMPREHENSIVE METABOLIC PANEL
ALT: 19 U/L (ref 0–44)
AST: 14 U/L — ABNORMAL LOW (ref 15–41)
Albumin: 3 g/dL — ABNORMAL LOW (ref 3.5–5.0)
Alkaline Phosphatase: 88 U/L (ref 38–126)
Anion gap: 9 (ref 5–15)
BUN: 36 mg/dL — ABNORMAL HIGH (ref 8–23)
CO2: 19 mmol/L — ABNORMAL LOW (ref 22–32)
Calcium: 9.4 mg/dL (ref 8.9–10.3)
Chloride: 106 mmol/L (ref 98–111)
Creatinine, Ser: 2.17 mg/dL — ABNORMAL HIGH (ref 0.61–1.24)
GFR, Estimated: 34 mL/min — ABNORMAL LOW (ref >60)
Glucose, Bld: 224 mg/dL — ABNORMAL HIGH (ref 70–99)
Potassium: 4.1 mmol/L (ref 3.5–5.1)
Sodium: 134 mmol/L — ABNORMAL LOW (ref 135–145)
Total Bilirubin: 0.9 mg/dL (ref 0.3–1.2)
Total Protein: 5.3 g/dL — ABNORMAL LOW (ref 6.5–8.1)

## 2021-10-23 LAB — CBC WITH DIFFERENTIAL/PLATELET
Abs Immature Granulocytes: 0.06 10*3/uL (ref 0.00–0.07)
Basophils Absolute: 0 10*3/uL (ref 0.0–0.1)
Basophils Relative: 1 %
Eosinophils Absolute: 0.2 10*3/uL (ref 0.0–0.5)
Eosinophils Relative: 3 %
HCT: 37.5 % — ABNORMAL LOW (ref 39.0–52.0)
Hemoglobin: 12.1 g/dL — ABNORMAL LOW (ref 13.0–17.0)
Immature Granulocytes: 1 %
Lymphocytes Relative: 10 %
Lymphs Abs: 0.8 10*3/uL (ref 0.7–4.0)
MCH: 28.2 pg (ref 26.0–34.0)
MCHC: 32.3 g/dL (ref 30.0–36.0)
MCV: 87.4 fL (ref 80.0–100.0)
Monocytes Absolute: 0.7 10*3/uL (ref 0.1–1.0)
Monocytes Relative: 9 %
Neutro Abs: 6.4 10*3/uL (ref 1.7–7.7)
Neutrophils Relative %: 76 %
Platelets: 284 10*3/uL (ref 150–400)
RBC: 4.29 MIL/uL (ref 4.22–5.81)
RDW: 14.6 % (ref 11.5–15.5)
WBC: 8.2 10*3/uL (ref 4.0–10.5)
nRBC: 0 % (ref 0.0–0.2)

## 2021-10-23 LAB — PROTIME-INR
INR: 1.5 — ABNORMAL HIGH (ref 0.8–1.2)
Prothrombin Time: 17.6 seconds — ABNORMAL HIGH (ref 11.4–15.2)

## 2021-10-23 LAB — GLUCOSE, CAPILLARY
Glucose-Capillary: 216 mg/dL — ABNORMAL HIGH (ref 70–99)
Glucose-Capillary: 207 mg/dL — ABNORMAL HIGH (ref 70–99)
Glucose-Capillary: 244 mg/dL — ABNORMAL HIGH (ref 70–99)
Glucose-Capillary: 216 mg/dL — ABNORMAL HIGH (ref 70–99)

## 2021-10-23 LAB — MAGNESIUM: Magnesium: 1.8 mg/dL (ref 1.7–2.4)

## 2021-10-23 LAB — PHOSPHORUS: Phosphorus: 3.1 mg/dL (ref 2.5–4.6)

## 2021-10-23 MED ORDER — LABETALOL HCL 5 MG/ML IV SOLN
10.0000 mg | INTRAVENOUS | Status: DC | PRN
  Administered 2021-10-23 (×2): 10 mg via INTRAVENOUS
  Filled 2021-10-23 (×2): qty 4

## 2021-10-23 MED ORDER — HYDRALAZINE HCL 50 MG PO TABS
100.0000 mg | ORAL_TABLET | Freq: Three times a day (TID) | ORAL | Status: DC
  Administered 2021-10-23 – 2021-10-26 (×9): 100 mg via ORAL
  Filled 2021-10-23 (×9): qty 2

## 2021-10-23 MED ORDER — HYDRALAZINE HCL 20 MG/ML IJ SOLN
10.0000 mg | Freq: Four times a day (QID) | INTRAMUSCULAR | Status: DC | PRN
  Administered 2021-10-23 – 2021-10-24 (×2): 10 mg via INTRAVENOUS
  Filled 2021-10-23 (×2): qty 1

## 2021-10-23 MED ORDER — INSULIN ASPART 100 UNIT/ML IJ SOLN
2.0000 [IU] | Freq: Three times a day (TID) | INTRAMUSCULAR | Status: DC
  Administered 2021-10-23 – 2021-10-26 (×9): 2 [IU] via SUBCUTANEOUS

## 2021-10-23 MED ORDER — CARVEDILOL 12.5 MG PO TABS
12.5000 mg | ORAL_TABLET | Freq: Two times a day (BID) | ORAL | Status: DC
  Administered 2021-10-23 – 2021-10-24 (×2): 12.5 mg via ORAL
  Filled 2021-10-23 (×2): qty 1

## 2021-10-23 MED ORDER — WARFARIN SODIUM 5 MG PO TABS
5.0000 mg | ORAL_TABLET | Freq: Once | ORAL | Status: AC
  Administered 2021-10-23: 5 mg via ORAL
  Filled 2021-10-23: qty 1

## 2021-10-23 MED ORDER — HYDRALAZINE HCL 50 MG PO TABS: 50.0000 mg | ORAL_TABLET | Freq: Three times a day (TID) | ORAL | Status: DC

## 2021-10-23 NOTE — Progress Notes (Signed)
Patient resting on home CPAP. No assistance needed from RT at this time.

## 2021-10-23 NOTE — Progress Notes (Signed)
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  The patient had reported that he was having severe episodes of hypotension during his recent hospitalization and would experience dizzy sensation, nausea and mild headaches with some of these episodes.  His blood pressure medications were adjusted during the hospitalization but he continued these at the rehab facility initiated clonidine during 1 recent episodes but otherwise reports that he has not been any changes to his medications at discharge.  In particular episode yesterday morning and was sent to the ED and not taking any of his antihypertensives.  His chronic leg swelling but is currently getting better.  Upon arrival to ED he is found to have a blood pressure as high of 204/119.  EKG showed sinus rhythm with LVH and repolarization abnormalities.  Chest x-ray showed bibasilar atelectasis and consolidation with small bilateral pleural effusions.  Head CT was notable for a subacute small vessel ischemic changes consistent with prior MRI but without any acute findings.  The EDP discussed with cardiology and the patient was given IV labetalol and acetaminophen.  Currently his blood pressure remains elevated and INR remains subtherapeutic but it is improving.  Because of his uncontrolled hypertension not improving with medication adjustments cardiology been consulted for further evaluation  recommendations  Assessment & Plan:   Principal Problem:   Hypertensive urgency Active Problems:   Diabetes mellitus type 2 in obese (HCC)   Severe obstructive sleep apnea   Hypothyroidism   Stage 3b chronic kidney disease (CKD) (HCC)   Immunosuppressed status (HCC)   Aortic valve stenosis   S/P AVR (aortic valve replacement)   Cerebrovascular accident (CVA) due to embolism of precerebral artery ([place])  Hypertensive urgency  - Presents with intermittent dizziness and nausea that patient attributes to severely elevated BPs and found to have BP as high as 204/119 in ED; blood pressures have been uncontrolled and has been as high as 228/111 and he has been having dizziness and headaches  - No evidence for end-organ damage; improved with IV labetalol x1 in ED and will resume labetalol and IV hydralazine - Resume his oral hypertensives now (lisinopril, amlodipine, metoprolol, hydralazine); we increased his hydralazine today and he is on max dose Amlodipine -Continue monitor blood pressures per protocol and if necessary will discuss with cardiology -Last blood pressure reading was 196/89   CAD s/p CABG; AS s/p AVR  - S/p LIMA to LAD and AVR on 10/05/21  - Mild flat elevation in troponin and elevated BNP in ED without angina or overt CHF; ED discussed with cardiology and enzyme elevations attributed to recent surgery   -Continue to monitor and trend and follow-up -Complain of any chest pain but cardiology consulted   Hx of recent CVA  - Suffered embolic CVA during recent admission in setting of post-operative atrial fibrillation and was seen by neurology who recommended warfarin and increasing statin intensity   - Continue warfarin, Lipitor 80 mg  -We will likely bridge with a Lovenox bridge given his  subtherapeutic INR  Atrial fibrillation  - Started on warfarin for post-operative a fib with embolic stroke during recent admission - Continue warfarin with pharmacy assistance, continue  metoprolol   -Patient's last INR was 1.5   CKD IIIb; renal transplant recipient  Metabolic Acidosis - SCr is 2.04 on admission, improved from recent admission; BUN/creatinine is now 31/2.06 and is now slightly worsened to 36/2.17 -Patient's CO2 is 19, AG is 9, and Chloride Level is 106 -Renally-dose medications, continue Cellcept and prednisone 5 mg qD  -Avoid further nephrotoxic medications, contrast dyes, hypotension renally dose medications   Insulin-dependent DM  - A1c was 7.1% in Dec 2022  -Continue CBG checks and Sensitive Novolog SSI AC/HS -Added 2 units TIDwm -CBG's ranging from 207-287   OSA  -Continue BiPAP qHS     Hypothyroidism  -Continue Levothyroxine 100 mcg po Daily    Anxiety  -C/w Alprazolam 0.25 mg po TIDprn Anxiety    DVT prophylaxis: Anticoagulated with Coumadin and will initiate Lovenox bridge Code Status: FULL CODE Family Communication: No family currently present at bedside Disposition Plan: Anticipating discharging to SNF  Status is: Inpatient   The patient will require care spanning > 2 midnights and should be moved to inpatient because: His BP needs to improve and will need an INR Closer to becoming Therapeutic    Consultants:  Cardiology  Procedures: None  Antimicrobials:  Anti-infectives (From admission, onward)    None        Subjective: Seen and examined at bedside and he is still feeling dizzy this morning and had a headache.  Blood pressure still remains significantly elevated despite medication changes.  Because of this cardiology has been consulted for further evaluation recommendations.  Objective: Vitals:   10/23/21 1044 10/23/21 1216 10/23/21 1243 10/23/21 1355  BP: (!) 168/78 (!) 173/85 (!) 196/89   Pulse: 67 65 67   Resp: 18 18 20 20   Temp:  (!) 97.3 F (36.3 C)    TempSrc:  Oral    SpO2:      Weight:      Height:        Intake/Output Summary (Last 24 hours) at 10/23/2021 1416 Last data filed at 10/23/2021  0507 Gross per 24 hour  Intake 480 ml  Output 800 ml  Net -320 ml    Filed Weights   10/21/21 1036  Weight: 122.5 kg   Examination: Physical Exam:  Constitutional: WN/WD obese Caucasian male in NAD appears slightly uncomfortable  Eyes: Lids and conjunctivae normal, sclerae anicteric  ENMT: External Ears, Nose appear normal. Grossly normal hearing. Mucous membranes are moist.  Neck: Appears normal, supple, no cervical masses, normal ROM, no appreciable thyromegaly; no JVD Respiratory: Diminished to auscultation bilaterally, no wheezing, rales, rhonchi or crackles. Normal respiratory effort and patient is not tachypenic. No accessory muscle use. Wearing CPAP Cardiovascular: RRR, no murmurs / rubs / gallops. S1 and S2 auscultated.Trace Extremity edema Abdomen: Soft, non-tender, Distended 2/2 body habitus. Bowel sounds positive.  GU: Deferred. Musculoskeletal: No clubbing / cyanosis of digits/nails. No joint deformity upper and lower extremities.  Skin: No rashes, lesions, ulcers. No induration; Warm and dry.  Neurologic: CN 2-12 grossly intact with no focal deficits. Romberg sign cerebellar reflexes not assessed.  Psychiatric: Normal judgment and insight. Alert and oriented x 3. Normal mood and appropriate affect.   Data Reviewed: I have personally reviewed following labs and imaging studies  CBC: Recent Labs  Lab 10/17/21 0341 10/18/21 3016 10/21/21 1634 10/22/21 0411 10/23/21 0109  WBC 9.4 9.1 8.4 9.6 8.2  NEUTROABS  --   --  6.7  --  6.4  HGB 13.5 12.4* 12.3* 12.5* 12.1*  HCT 42.5 40.1 38.7* 38.3* 37.5*  MCV 89.3 90.7 88.4 88.2 87.4  PLT 409* 408* 376 338 502    Basic Metabolic Panel: Recent Labs  Lab 10/21/21 1634 10/22/21 0411 10/23/21 0227  NA 133* 137 134*  K 3.9 4.2 4.1  CL 105 105 106  CO2 21* 23 19*  GLUCOSE 214* 168* 224*  BUN 33* 31* 36*  CREATININE 2.03* 2.06* 2.17*  CALCIUM 9.1 9.5 9.4  MG  --  1.8 1.8  PHOS  --   --  3.1    GFR: Estimated  Creatinine Clearance: 47 mL/min (A) (by C-G formula based on SCr of 2.17 mg/dL (H)). Liver Function Tests: Recent Labs  Lab 10/21/21 1634 10/23/21 0227  AST 18 14*  ALT 23 19  ALKPHOS 102 88  BILITOT 0.8 0.9  PROT 5.6* 5.3*  ALBUMIN 3.3* 3.0*    No results for input(s): LIPASE, AMYLASE in the last 168 hours. No results for input(s): AMMONIA in the last 168 hours. Coagulation Profile: Recent Labs  Lab 10/17/21 0341 10/18/21 0643 10/21/21 1634 10/22/21 0411 10/23/21 0227  INR 2.3* 1.9* 1.3* 1.3* 1.5*    Cardiac Enzymes: No results for input(s): CKTOTAL, CKMB, CKMBINDEX, TROPONINI in the last 168 hours. BNP (last 3 results) No results for input(s): PROBNP in the last 8760 hours. HbA1C: No results for input(s): HGBA1C in the last 72 hours. CBG: Recent Labs  Lab 10/22/21 1613 10/22/21 1819 10/22/21 2215 10/23/21 0813 10/23/21 1140  GLUCAP 287* 246* 246* 216* 207*    Lipid Profile: No results for input(s): CHOL, HDL, LDLCALC, TRIG, CHOLHDL, LDLDIRECT in the last 72 hours. Thyroid Function Tests: No results for input(s): TSH, T4TOTAL, FREET4, T3FREE, THYROIDAB in the last 72 hours. Anemia Panel: No results for input(s): VITAMINB12, FOLATE, FERRITIN, TIBC, IRON, RETICCTPCT in the last 72 hours. Sepsis Labs: No results for input(s): PROCALCITON, LATICACIDVEN in the last 168 hours.  Recent Results (from the past 240 hour(s))  Resp Panel by RT-PCR (Flu A&B, Covid) Nasopharyngeal Swab     Status: None   Collection Time: 10/18/21 12:00 PM   Specimen: Nasopharyngeal Swab; Nasopharyngeal(NP) swabs in vial transport medium  Result Value Ref Range Status   SARS Coronavirus 2 by RT PCR NEGATIVE NEGATIVE Final    Comment: (NOTE) SARS-CoV-2 target nucleic acids are NOT DETECTED.  The SARS-CoV-2 RNA is generally detectable in upper respiratory specimens during the acute phase of infection. The lowest concentration of SARS-CoV-2 viral copies this assay can detect is 138  copies/mL. A negative result does not preclude SARS-Cov-2 infection and should not be used as the sole basis for treatment or other patient management decisions. A negative result may occur with  improper specimen collection/handling, submission of specimen other than nasopharyngeal swab, presence of viral mutation(s) within the areas targeted by this assay, and inadequate number of viral copies(<138 copies/mL). A negative result must be combined with clinical observations, patient history, and epidemiological information. The expected result is Negative.  Fact Sheet for Patients:  EntrepreneurPulse.com.au  Fact Sheet for Healthcare Providers:  IncredibleEmployment.be  This test is no t yet approved or cleared by the [place] FDA and  has been authorized for detection and/or diagnosis of SARS-CoV-2 by FDA under an Emergency Use Authorization (EUA). This EUA will remain  in effect (meaning this test can be used) for the duration  of the COVID-19 declaration under Section 564(b)(1) of the Act, 21 U.S.C.section 360bbb-3(b)(1), unless the authorization is terminated  or revoked sooner.       Influenza A by PCR NEGATIVE NEGATIVE Final   Influenza B by PCR NEGATIVE NEGATIVE Final    Comment: (NOTE) The Xpert Xpress SARS-CoV-2/FLU/RSV plus assay is intended as an aid in the diagnosis of influenza from Nasopharyngeal swab specimens and should not be used as a sole basis for treatment. Nasal washings and aspirates are unacceptable for Xpert Xpress SARS-CoV-2/FLU/RSV testing.  Fact Sheet for Patients: EntrepreneurPulse.com.au  Fact Sheet for Healthcare Providers: IncredibleEmployment.be  This test is not yet approved or cleared by the [place] FDA and has been authorized for detection and/or diagnosis of SARS-CoV-2 by FDA under an Emergency Use Authorization (EUA). This EUA will remain in effect (meaning  this test can be used) for the duration of the COVID-19 declaration under Section 564(b)(1) of the Act, 21 U.S.C. section 360bbb-3(b)(1), unless the authorization is terminated or revoked.  Performed at [facility], [place] [address] [place], [place] [address]   Resp Panel by RT-PCR (Flu A&B, Covid) Nasopharyngeal Swab     Status: None   Collection Time: 10/21/21 10:39 PM   Specimen: Nasopharyngeal Swab; Nasopharyngeal(NP) swabs in vial transport medium  Result Value Ref Range Status   SARS Coronavirus 2 by RT PCR NEGATIVE NEGATIVE Final    Comment: (NOTE) SARS-CoV-2 target nucleic acids are NOT DETECTED.  The SARS-CoV-2 RNA is generally detectable in upper respiratory specimens during the acute phase of infection. The lowest concentration of SARS-CoV-2 viral copies this assay can detect is 138 copies/mL. A negative result does not preclude SARS-Cov-2 infection and should not be used as the sole basis for treatment or other patient management decisions. A negative result may occur with  improper specimen collection/handling, submission of specimen other than nasopharyngeal swab, presence of viral mutation(s) within the areas targeted by this assay, and inadequate number of viral copies(<138 copies/mL). A negative result must be combined with clinical observations, patient history, and epidemiological information. The expected result is Negative.  Fact Sheet for Patients:  EntrepreneurPulse.com.au  Fact Sheet for Healthcare Providers:  IncredibleEmployment.be  This test is no t yet approved or cleared by the [place] FDA and  has been authorized for detection and/or diagnosis of SARS-CoV-2 by FDA under an Emergency Use Authorization (EUA). This EUA will remain  in effect (meaning this test can be used) for the duration of the COVID-19 declaration under Section 564(b)(1) of the Act, 21 U.S.C.section 360bbb-3(b)(1), unless the  authorization is terminated  or revoked sooner.       Influenza A by PCR NEGATIVE NEGATIVE Final   Influenza B by PCR NEGATIVE NEGATIVE Final    Comment: (NOTE) The Xpert Xpress SARS-CoV-2/FLU/RSV plus assay is intended as an aid in the diagnosis of influenza from Nasopharyngeal swab specimens and should not be used as a sole basis for treatment. Nasal washings and aspirates are unacceptable for Xpert Xpress SARS-CoV-2/FLU/RSV testing.  Fact Sheet for Patients: EntrepreneurPulse.com.au  Fact Sheet for Healthcare Providers: IncredibleEmployment.be  This test is not yet approved or cleared by the [place] FDA and has been authorized for detection and/or diagnosis of SARS-CoV-2 by FDA under an Emergency Use Authorization (EUA). This EUA will remain in effect (meaning this test can be used) for the duration of the COVID-19 declaration under Section 564(b)(1) of the Act, 21 U.S.C. section 360bbb-3(b)(1), unless the authorization is terminated or revoked.  Performed  at [facility], [place] [address] [place], [place] [address]      RN Pressure Injury Documentation:     Estimated body mass index is 37.66 kg/m as calculated from the following:   Height as of this encounter: 5\' 11"  (1.803 m).   Weight as of this encounter: 122.5 kg.  Malnutrition Type:   Malnutrition Characteristics:   Nutrition Interventions:   Radiology Studies: CT HEAD WO CONTRAST (5MM)  Result Date: 10/21/2021 CLINICAL DATA:  Headache, dizziness, weakness EXAM: CT HEAD WITHOUT CONTRAST TECHNIQUE: Contiguous axial images were obtained from the base of the skull through the vertex without intravenous contrast. RADIATION DOSE REDUCTION: This exam was performed according to the departmental dose-optimization program which includes automated exposure control, adjustment of the mA and/or kV according to patient size and/or use of iterative reconstruction technique.  COMPARISON:  10/07/2021, 10/11/2021 FINDINGS: Brain: Hypodensities within the left frontal parietal periventricular white matter consistent with areas of acute infarct seen on previous MRI. No new infarct or hemorrhage. Lateral ventricles and midline structures are unremarkable. No acute extra-axial fluid collections. No mass effect. Vascular: No hyperdense vessel or unexpected calcification. Stable atherosclerosis. Skull: Normal. Negative for fracture or focal lesion. Sinuses/Orbits: No acute finding. Other: None. IMPRESSION: 1. Subacute small vessel ischemic changes within the left frontal parietal white matter, corresponding to previous MRI findings. 2. No acute infarct or hemorrhage. Electronically Signed   By: Randa Ngo M.D.   On: 10/21/2021 19:11    Scheduled Meds:  amiodarone  200 mg Oral BID   amLODipine  10 mg Oral Daily   atorvastatin  80 mg Oral Daily   DULoxetine  60 mg Oral Daily   enoxaparin (LOVENOX) injection  120 mg Subcutaneous Q12H   gabapentin  200 mg Oral Daily   hydrALAZINE  50 mg Oral TID   insulin aspart  0-5 Units Subcutaneous QHS   insulin aspart  0-9 Units Subcutaneous TID WC   insulin aspart  2 Units Subcutaneous TID WC   levothyroxine  100 mcg Oral QAC breakfast   lisinopril  40 mg Oral Daily   mouth rinse  15 mL Mouth Rinse BID   metoprolol tartrate  50 mg Oral BID   mycophenolate  1,000 mg Oral Q12H   predniSONE  5 mg Oral Q breakfast   sodium chloride flush  3 mL Intravenous Q12H   tamsulosin  0.8 mg Oral QHS   warfarin  5 mg Oral ONCE-1600   Warfarin - Pharmacist Dosing Inpatient   Does not apply q1600   Continuous Infusions:   LOS: 0 days   Kerney Elbe, DO [facility] PAGER is on AMION  If 7PM-7AM, please contact night-coverage [email or web address]

## 2021-10-23 NOTE — Progress Notes (Signed)
BP at 1044hrs 168/78, HR 67. Labetolol 10mg  IV given per PRN order for SBP >125mmhg. BP 196/89 at 1243hrs, HR 67.  Hydralazine 10mg  IV given per prn order for SBP >129mmhg.  Will continue to monitor.

## 2021-10-23 NOTE — TOC Progression Note (Signed)
Transition of Care [facility]) - Progression Note    Patient Details  Name: Robert [name] MRN: [id number] Date of Birth: [birth year]  Transition of Care [facility]) CM/SW [place], [place] Phone Number: 10/23/2021, 12:39 PM  Clinical Narrative:     CSW spoke with pt, pt ok to return back to [facility] when medically ready. Pt concerned if bed will still be available, CSW advised to reach out to [facility] to be sure, CSW Ebony Hail will text Juliann Pulse @[facility]  to ensure pt can return.     Barriers to Discharge: Continued Medical Work up, Orthoptist and Services                                                 Social Determinants of Health (SDOH) Interventions    Readmission Risk Interventions No flowsheet data found.

## 2021-10-23 NOTE — Evaluation (Signed)
Occupational Therapy Evaluation Patient Details Name: Robert [name] MRN: [id number] DOB: 1961/05/25 Today's Date: 10/23/2021   History of Present Illness 61 y.o. male presents to [facility] from SNF on 10/21/2021 with dizziness, nausea, and hypertension. PMH includes IDDM, CKD s/p renal transplant, HRTN, CAD s/p CABG 09/2021.   Clinical Impression   Pt with recent hospitalization for CABG in 09/2021 presenting from SNF, currently limited by dizziness and hypertension at this time. Pt mod-max A for ADLs, min A for bed mobility, and min A for transfers. Pt able to verbalize and adhere to sternal precautions throughout session. Per tele monitor, pt with high BP throughout the night, reporting dizziness upon sitting EOB. BP taken throughout session: supine 182/87, seated EOB 208/105, in standing after chair transfer 228/111, and after sitting in chair ~2 mins post transfer 198/88, RN immediately notified. Pt anxious during session and understandably concerned about hypertension. Pt presenting with impairments listed below, will continue to follow acutely. Recommend SNF at d/c.     Recommendations for follow up therapy are one component of a multi-disciplinary discharge planning process, led by the attending physician.  Recommendations may be updated based on patient status, additional functional criteria and insurance authorization.   Follow Up Recommendations  Skilled nursing-short term rehab (<3 hours/day)    Assistance Recommended at Discharge Intermittent Supervision/Assistance  Patient can return home with the following A lot of help with walking and/or transfers;A lot of help with bathing/dressing/bathroom;Direct supervision/assist for medications management;Assistance with cooking/housework;Assist for transportation;Help with stairs or ramp for entrance    Functional Status Assessment  Patient has had a recent decline in their functional status and demonstrates the ability to make  significant improvements in function in a reasonable and predictable amount of time.  Equipment Recommendations  BSC/3in1    Recommendations for Other Services PT consult     Precautions / Restrictions Precautions Precautions: Fall;Sternal Precaution Booklet Issued: No Precaution Comments: R arm restriction due to fistula Restrictions Weight Bearing Restrictions: No Other Position/Activity Restrictions: sternal precautions      Mobility Bed Mobility Overal bed mobility: Needs Assistance Bed Mobility: Rolling;Sidelying to Sit Rolling: Min assist Sidelying to sit: HOB elevated;Mod assist       General bed mobility comments: mod A to elevate trunk from sidelying postion into sitting, able to adhere to sternal precautions throughout    Transfers Overall transfer level: Needs assistance Equipment used: Rolling walker (2 wheels) Transfers: Sit to/from Stand Sit to Stand: Min assist Stand pivot transfers: Min assist                Balance Overall balance assessment: Needs assistance Sitting-balance support: No upper extremity supported;Feet supported Sitting balance-Leahy Scale: Fair     Standing balance support: Bilateral upper extremity supported;Reliant on assistive device for balance Standing balance-Leahy Scale: Poor                             ADL either performed or assessed with clinical judgement   ADL Overall ADL's : Needs assistance/impaired Eating/Feeding: Set up;Sitting   Grooming: Set up;Sitting   Upper Body Bathing: Moderate assistance;Sitting   Lower Body Bathing: Maximal assistance;Sitting/lateral leans   Upper Body Dressing : Moderate assistance;Sitting   Lower Body Dressing: Maximal assistance;Sitting/lateral leans Lower Body Dressing Details (indicate cue type and reason): don socks sitting EOB Toilet Transfer: Minimal assistance;Stand-pivot;BSC/3in1;Rolling walker (2 wheels) Toilet Transfer Details (indicate cue type and  reason): simulated to chair Toileting- Clothing Manipulation and Hygiene:  Maximal assistance;Sitting/lateral lean Toileting - Clothing Manipulation Details (indicate cue type and reason): to manage gown prior to sitting     Functional mobility during ADLs: Minimal assistance;Rolling walker (2 wheels)       Vision   Vision Assessment?: No apparent visual deficits     Perception     Praxis      Pertinent Vitals/Pain Pain Assessment: No/denies pain     Hand Dominance     Extremity/Trunk Assessment Upper Extremity Assessment Upper Extremity Assessment: Generalized weakness RUE Deficits / Details: unable to fully assess due to sternal precautions   Lower Extremity Assessment Lower Extremity Assessment: Defer to PT evaluation   Cervical / Trunk Assessment Cervical / Trunk Assessment: Other exceptions Cervical / Trunk Exceptions: sternal incision   Communication Communication Communication: No difficulties   Cognition Arousal/Alertness: Awake/alert Behavior During Therapy: WFL for tasks assessed/performed;Anxious Overall Cognitive Status: Within Functional Limits for tasks assessed                                       General Comments  Pt reporting dizziness and hypertensive during session, per telemetry pt BP 160's/170's overnight, BP taken in supine (182/87), sitting EOB (208/105), in standing after chair transfer (228/111), and after seated for 1 minute post transfer (198/88). RN notified immediatly    Exercises     Shoulder Instructions      Home Living Family/patient expects to be discharged to:: Private residence Living Arrangements: Spouse/significant other Available Help at Discharge: Family Type of Home: House Home Access: Stairs to enter [employer] of Steps: 3 Entrance Stairs-Rails: None Home Layout: Two level;Able to live on main level with bedroom/bathroom Alternate Level Stairs-Number of Steps: 2 flights of  steps Alternate Level Stairs-Rails: Right Bathroom Shower/Tub: Occupational psychologist: Standard     Home Equipment: Shower seat - built in;Hand held shower head;Grab bars - tub/shower          Prior Functioning/Environment Prior Level of Function : Independent/Modified Independent;Working/employed;Driving               ADLs Comments: reports doing minimal IADLs        OT Problem List: Decreased strength;Decreased range of motion;Decreased activity tolerance;Impaired balance (sitting and/or standing);Decreased coordination;Decreased knowledge of use of DME or AE;Decreased knowledge of precautions;Cardiopulmonary status limiting activity;Impaired UE functional use;Increased edema      OT Treatment/Interventions: Self-care/ADL training;Therapeutic exercise;Neuromuscular education;Energy conservation;DME and/or AE instruction;Manual therapy;Modalities;Therapeutic activities;Patient/family education;Balance training    OT Goals(Current goals can be found in the care plan section) Acute Rehab OT Goals Patient Stated Goal: to get BP under control OT Goal Formulation: With patient Time For Goal Achievement: 11/06/21 Potential to Achieve Goals: Good ADL Goals Pt Will Perform Upper Body Dressing: with min assist;sitting Pt Will Perform Lower Body Dressing: with mod assist;with adaptive equipment;sit to/from stand Pt Will Transfer to Toilet: with min guard assist;stand pivot transfer;bedside commode  OT Frequency: Min 2X/week    Co-evaluation       OT goals addressed during session: ADL's and self-care      AM-PAC OT "6 Clicks" Daily Activity     Outcome Measure Help from another person eating meals?: None Help from another person taking care of personal grooming?: A Little Help from another person toileting, which includes using toliet, bedpan, or urinal?: A Lot Help from another person bathing (including washing, rinsing, drying)?: A Lot Help from another  person to put on and taking off regular upper body clothing?: A Little Help from another person to put on and taking off regular lower body clothing?: A Lot 6 Click Score: 16   End of Session Equipment Utilized During Treatment: Gait belt;Rolling walker (2 wheels) Nurse Communication: Mobility status;Patient requests pain meds;Other (comment) (RN notified of increased BP)  Activity Tolerance: Patient limited by fatigue Patient left: in chair;with call bell/phone within reach;with nursing/sitter in room  OT Visit Diagnosis: Unsteadiness on feet (R26.81);Muscle weakness (generalized) (M62.81)                Time: 5465-0354 OT Time Calculation (min): 28 min Charges:  OT General Charges $OT Visit: 1 Visit OT Evaluation $OT Eval Moderate Complexity: 1 Mod OT Treatments $Self Care/Home Management : 8-22 mins  Lynnda Child, OTD, OTR/L Acute Rehab [phone] - West 10/23/2021, 10:09 AM

## 2021-10-23 NOTE — Progress Notes (Signed)
Patient BP 168/73, HR 72.  Labetolol IV 10mg  given per prn order for SBP>110mmHG. Will continue to monitor.

## 2021-10-23 NOTE — TOC Progression Note (Signed)
Transition of Care [facility]) - Progression Note    Patient Details  Name: Robert [name] MRN: [id number] Date of Birth: 1961/04/05  Transition of Care [facility]) CM/SW [place], [place] Phone Number: 10/23/2021, 12:12 PM  Clinical Narrative:     CSW tried to contact pt via phone, no answer, tried to call pt on room phone, no answer. Waiting on the RN to assist. CSW spoke with spouse, stated as far as she knows pt is ok to return to [facility]. CSW advised will try to reach out to pt again to confirm but did advise that pt is not medically ready to dc at this time. Spouse had question about [facility] holding pt's bed, CSW advised spouse to reach out to [facility] to confirm process.        Expected Discharge Plan and Services                                                 Social Determinants of Health (SDOH) Interventions    Readmission Risk Interventions No flowsheet data found.

## 2021-10-23 NOTE — Progress Notes (Signed)
ANTICOAGULATION CONSULT NOTE  Pharmacy Consult for enoxaparin>warfarin Indication: atrial fibrillation  No Known Allergies  Patient Measurements: Height: 5\' 11"  (180.3 cm) Weight: 122.5 kg (270 lb) IBW/kg (Calculated) : 75.3  Vital Signs: Temp: 97.8 F (36.6 C) (01/16 0838) Temp Source: Oral (01/16 0838) BP: 168/78 (01/16 1044) Pulse Rate: 67 (01/16 1044)  Labs: Recent Labs    10/21/21 1634 10/21/21 1811 10/22/21 0411 10/23/21 0227  HGB 12.3*  --  12.5* 12.1*  HCT 38.7*  --  38.3* 37.5*  PLT 376  --  338 284  LABPROT 15.9*  --  15.8* 17.6*  INR 1.3*  --  1.3* 1.5*  CREATININE 2.03*  --  2.06* 2.17*  TROPONINIHS 144* 154*  --   --      Estimated Creatinine Clearance: 47 mL/min (A) (by C-G formula based on SCr of 2.17 mg/dL (H)).   Medical History: Past Medical History:  Diagnosis Date   Anemia    low hgb at present   Arthritis    FEET   Blood transfusion without reported diagnosis 1974   kidney removed - L   Cancer ([place])    skin ca right shoulder, plastic dsyplasia, pre-Ca polpys removed on Colonoscopy- 07/2014   Colon polyps 07/23/2014   Tubular adenomas x 5   Constipation    Coronary artery disease    Diabetes mellitus without complication ([place])    Type 2   Difficult intubation    unsure of actual problem but it was during the January 28, 2015 procedure.   Dysrhythmia    Eczema    ESRD (end stage renal disease) ([place])    hemodialysis 06/2014-02/2017, s/p living unrelated kidney transplant 02/26/17   Headache(784.0)    slight dull h/a due kidney failure   Heart murmur    aortic stenosis (moderate-severe 04/2021)   Hyperlipidemia    Hypertension    SVT h/o , followed by Dr. Claiborne Billings   Hypothyroidism    Macular degeneration    Neuropathy    right hand and both feet   PAT (paroxysmal atrial tachycardia) ([facility])    PSVT (paroxysmal supraventricular tachycardia) ([facility])    SBO (small bowel obstruction) ([place]) 05/2015   Shortness of breath    with exertion    Sleep apnea 06/18/2011   split-night sleep study- [facility]., BiPAP- 13-16   Thyroid disease    Umbilical hernia    s/p repair 04/28/15   Assessment: 80 yom s/p CABG and AVR 2 weeks ago. Warfarin per pharmacy consult placed for AFib.  Home dose of warfarin is 2.5 mg daily. Patient last taken 1/13 at 1800.  INR still low today at 1.5. CBC stable. No bleeding issues noted. Lovenox bridge started overnight d/t patient's history of embolic stroke.   Goal of Therapy:  INR 2-2.5 (per 1/6 note by MD Bartle) Monitor platelets by anticoagulation protocol: Yes   Plan:  Warfarin 5mg  tonight, subsequent dosing per INR Continue lovenox 120mg  q12 hours Monitor for DDIs Monitor CBC, Daily INR  Thank you, Erin Hearing PharmD., BCPS Clinical Pharmacist 10/23/2021 11:56 AM

## 2021-10-23 NOTE — Progress Notes (Incomplete)
Progress Note  Patient Name: Robert [name] Date of Encounter: 10/23/2021  [facility] Cardiologist: Shelva Majestic, MD ***  Subjective   ***  Inpatient Medications    Scheduled Meds:  amiodarone  200 mg Oral BID   amLODipine  10 mg Oral Daily   atorvastatin  80 mg Oral Daily   carvedilol  12.5 mg Oral BID WC   DULoxetine  60 mg Oral Daily   enoxaparin (LOVENOX) injection  120 mg Subcutaneous Q12H   gabapentin  200 mg Oral Daily   hydrALAZINE  100 mg Oral TID   insulin aspart  0-5 Units Subcutaneous QHS   insulin aspart  0-9 Units Subcutaneous TID WC   insulin aspart  2 Units Subcutaneous TID WC   levothyroxine  100 mcg Oral QAC breakfast   lisinopril  40 mg Oral Daily   mouth rinse  15 mL Mouth Rinse BID   mycophenolate  1,000 mg Oral Q12H   predniSONE  5 mg Oral Q breakfast   sodium chloride flush  3 mL Intravenous Q12H   tamsulosin  0.8 mg Oral QHS   Warfarin - Pharmacist Dosing Inpatient   Does not apply q1600   Continuous Infusions:  PRN Meds: acetaminophen **OR** acetaminophen, ALPRAZolam, hydrALAZINE, labetalol, ondansetron **OR** ondansetron (ZOFRAN) IV, senna-docusate   Vital Signs    Vitals:   10/23/21 1443 10/23/21 1630 10/23/21 1746 10/23/21 1844  BP: (!) 184/77 (!) 168/76 (!) 168/73 (!) 172/80  Pulse: 72 67 71 72  Resp: 18 20 20    Temp:      TempSrc:      SpO2:  96% 96% 95%  Weight:      Height:        Intake/Output Summary (Last 24 hours) at 10/23/2021 2006 Last data filed at 10/23/2021 1900 Gross per 24 hour  Intake 1440 ml  Output 1150 ml  Net 290 ml   Last 3 Weights 10/21/2021 10/18/2021 10/17/2021  Weight (lbs) 270 lb 287 lb 11.2 oz 292 lb 1.8 oz  Weight (kg) 122.471 kg 130.5 kg 132.5 kg      Telemetry    *** - Personally Reviewed  ECG    *** - Personally Reviewed  Physical Exam  *** GEN: No acute distress.   Neck: No JVD Cardiac: RRR, no murmurs, rubs, or gallops.  Respiratory: Clear to auscultation bilaterally. GI:  Soft, nontender, non-distended  MS: No edema; No deformity. Neuro:  Nonfocal  Psych: Normal affect   Labs    High Sensitivity Troponin:   Recent Labs  Lab 10/21/21 1634 10/21/21 1811  TROPONINIHS 144* 154*     Chemistry Recent Labs  Lab 10/21/21 1634 10/22/21 0411 10/23/21 0227  NA 133* 137 134*  K 3.9 4.2 4.1  CL 105 105 106  CO2 21* 23 19*  GLUCOSE 214* 168* 224*  BUN 33* 31* 36*  CREATININE 2.03* 2.06* 2.17*  CALCIUM 9.1 9.5 9.4  MG  --  1.8 1.8  PROT 5.6*  --  5.3*  ALBUMIN 3.3*  --  3.0*  AST 18  --  14*  ALT 23  --  19  ALKPHOS 102  --  88  BILITOT 0.8  --  0.9  GFRNONAA 36* 36* 34*  ANIONGAP 7 9 9     Lipids No results for input(s): CHOL, TRIG, HDL, LABVLDL, LDLCALC, CHOLHDL in the last 168 hours.  Hematology Recent Labs  Lab 10/21/21 1634 10/22/21 0411 10/23/21 0227  WBC 8.4 9.6 8.2  RBC 4.38 4.34  4.29  HGB 12.3* 12.5* 12.1*  HCT 38.7* 38.3* 37.5*  MCV 88.4 88.2 87.4  MCH 28.1 28.8 28.2  MCHC 31.8 32.6 32.3  RDW 14.5 14.6 14.6  PLT 376 338 284   Thyroid No results for input(s): TSH, FREET4 in the last 168 hours.  BNP Recent Labs  Lab 10/21/21 1634  BNP 324.0*    DDimer No results for input(s): DDIMER in the last 168 hours.   Radiology    No results found.  Cardiac Studies   Echocardiogram 05/02/2021: Impressions: 1. Left ventricular ejection fraction, by estimation, is 60 to 65%. The  left ventricle has normal function. The left ventricle has no regional  wall motion abnormalities. There is moderate left ventricular hypertrophy.  Left ventricular diastolic  parameters are consistent with Grade II diastolic dysfunction  (pseudonormalization). Elevated left ventricular end-diastolic pressure.   2. Right ventricular systolic function is normal. The right ventricular  size is normal. There is moderately elevated pulmonary artery systolic  pressure.   3. Left atrial size was moderately dilated.   4. The mitral valve is abnormal. Mild  mitral valve regurgitation.  Moderate to severe mitral annular calcification.   5. The aortic valve is tricuspid. There is moderate calcification of the  aortic valve. Aortic valve regurgitation is trivial. Moderate to severe  aortic valve stenosis. Aortic valve area, by VTI measures 1.09 cm. Aortic  valve mean gradient measures 44.4   mmHg. Aortic valve Vmax measures 3.99 m/s. DI is 0.29 - based on LVOT  diameter of 2.2 cm.   6. Aortic dilatation noted. There is mild dilatation of the ascending  aorta, measuring 42 mm.   7. The inferior vena cava is dilated in size with >50% respiratory  variability, suggesting right atrial pressure of 8 mmHg.   Comparison(s): Changes from prior study are noted. 11/16/2020: LVEF 65-70%,  moderate AS -mean gradient 34.6 mmHg.  _______________   Right/Left Cardiac Catheterization 08/22/2021:   2nd Mrg lesion is 50% stenosed.   Lat 2nd Mrg lesion is 20% stenosed.   RPAV lesion is 80% stenosed.   Dist LM to Prox LAD lesion is 80% stenosed.   Prox LAD to Mid LAD lesion is 90% stenosed.   LV end diastolic pressure is mildly elevated.   Diagnostic Dominance: Right    Patient Profile     61 y.o. male with a history of CAD s/p recent CABG x1 (LIMA to LAD) on 10/05/2021, severe aortic stenosis s/p recent AVR on 10/05/2021, paroxysmal SVT, hypertension, hyperlipidemia, type 2 diabetes mellitus, s/p nephrectomy at age 14 for congential nonfunctional left kidney and subsequent ESRD previously on dialysis and then renal transplant in 2018, obstructive sleep apnea, and obesity who presented to the ER with dizziness and weakness found to have severely elevated for which Cardiology has been consulted.  Assessment & Plan    Syncope Hypertensive Urgency Patient presented with dizziness followed by syncope in the setting of hypertensive urgency. BP as high as the 220s/110s this admission. He was gradually restarted on his home BP medications but BP still markedly  elevated. - Current home regimen: Amlodipine 10mg  daily, Lisinopril 40mg  daily, Lopressor 50mg  daily, Hydralazine 50mg  three times daily. This is what he is currently on here as well with PRN Hydralazine and PRN Labetalol ordered as well. - Will stop Lopressor and switch to Coreg 12.5mg  twice daily. If heart rate will allows, can go up to 25mg  twice daily. - Will increase Hydralazine to 100mg  three times daily. - May end  up needing to add Imdur. - In regards to syncopal episode, thinks this was secondary to marked hypertension. No concerning arrhythmias noted on telemetry. Will continue to monitor on telemetry while here.   Dyspnea Patient is very noticeably short of breath. He is not hypoxic and does not appear grossly volume overloaded. Chest x-ray showed bibasilar areas of atelectasis and/or consolidation with superimposed small bilateral pleural effusion. BNP mildly elevated in the 300s. - Will get limited Echo. - Will also order V/Q scan to make sure he does not have a PE given syncopal episode and degree of dyspnea   Elevated Troponin High-sensitivity troponin 144 >> 154. EKG shows no acute changes. Patient denies any chest pain.  - Troponin elevation consistent with demand ischemia in the setting of hypertensive urgency and recent surgery.  - No additional ischemic work-up necessary at this time.   CAD s/p Recent CABG S/p recent CABG with LIMA to LAD on 10/05/2021. - No angina. - No aspirin due to need for Coumadin. - Continue beta-blocker and high-intensity statin.   Severe Aortic Stenosis s/p Recent AVR S/p bioprosthetic AVR on 10/05/2021. - On Coumadin given post-op atrial fibrillation and CVA.   Post-Op Atrial Fibrillation Following recent CABG/AVR. Converted with Amiodarone. - Maintaining sinus rhythm. - Continue Amiodarone 200mg  twice daily.  - Continue beta-blocker as above. - Continue Coumadin. INR 1.5 today (goal 2.0-2.5). Dosing per Pharmacy.   Recent CVA Following  CABG/AVR, had acute CVA. MRI showed multiple acute to subacute small infarcts in multiple vascular territories raising the possibility of recent embolic event. Repeat head CT this admission showed subacute small vessel ischemic changes corresponding to recent MR findings but no acute infarct or hemorrhage. - Continue Coumadin as above.   Hyperlipidemia - Continue home Lipitor 80mg  daily.   Type 2 Diabetes Mellitus - Management per primary team.   CKD Stage IIIb S/p Renal Transplant Patient has a history of a nephrectomy at age 61 for congential nonfunctional left kidney and subsequent ESRD previously on dialysis and then renal transplant in 2018. Baseline creatinine around 2.0 to 2.3. - Creatinine stable at 2.17 today. - On Cellcept, Belatacept, and Prednisone. - Followed at [facility] as an outpatient.    Obstructive Sleep Apnea - Continue BiPAP at night.   {Are we signing off today?:[id number]}  For questions or updates, please contact [place] Please consult [email or web address] for contact info under        Signed, Freada Bergeron, MD  10/23/2021, 8:06 PM

## 2021-10-23 NOTE — NC FL2 (Signed)
[place] LEVEL OF CARE SCREENING TOOL     IDENTIFICATION  Patient Name: Robert [name] Birthdate: 06/15/1961 Sex: male Admission Date (Current Location): 10/21/2021  [facility] [place] Number:      Facility and Address:         Provider Number: [id number]  Attending Physician Name and Address:  Robert Elbe, DO  Relative Name and Phone Number:       Current Level of Care:   Recommended Level of Care:   Prior Approval Number:    Date Approved/Denied:   PASRR Number:    Discharge Plan:      Current Diagnoses: Patient Active Problem List   Diagnosis Date Noted   Hypertensive urgency 10/21/2021   Cerebrovascular accident (CVA) due to embolism of precerebral artery ([name])    S/P AVR (aortic valve replacement) 10/05/2021   Exertional dyspnea    Aortic valve stenosis    Fissure of skin 03/08/2020   AKI (acute kidney injury) ([place]) 08/08/2017   Diarrhea of presumed infectious origin 08/08/2017   GI bleed 07/26/2017   Kidney transplant status 07/26/2017   Inguinal hernia without obstruction or gangrene 06/30/2017   Tremor of both hands 06/29/2017   Diarrhea 06/28/2017   Hallucinations 06/28/2017   Need for prophylactic immunotherapy 05/20/2017   Hydrocele, acquired 04/07/2017   Scrotal edema 03/14/2017   H/O diabetic neuropathy 03/01/2017   Immunosuppressed status ([place]) 02/26/2017   Prophylactic antibiotic 02/26/2017   Benign neoplasm of transverse colon    Diverticulosis of colon without hemorrhage    Heme positive stool    Morbid obesity ([place]) 02/03/2016   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, with long-term current use of insulin 07/14/2015   Nausea & vomiting    HTN (hypertension) 05/11/2015   Chronic renal failure    Peritoneal dialysis catheter in place ([place]) 02/24/2015   Protein-calorie malnutrition, severe ([place]) 06/15/2014   Uremia of renal origin 06/12/2014   Stage 3b chronic  kidney disease (CKD) ([place]) 03/22/2014   Fatigue 01/08/2014   H/O unilateral nephrectomy 09/06/2013   Diabetes mellitus type 2 in obese ([place]) 09/06/2013   SVT (supraventricular tachycardia) ([place]) 09/06/2013   Lower extremity edema 09/06/2013   Severe obstructive sleep apnea 09/06/2013   Hypothyroidism 09/06/2013   Hyperlipidemia with target LDL less than 70 09/06/2013    Orientation RESPIRATION BLADDER Height & Weight            Weight: 270 lb (122.5 kg) Height:  5\' 11"  (180.3 cm)  BEHAVIORAL SYMPTOMS/MOOD NEUROLOGICAL BOWEL NUTRITION STATUS           AMBULATORY STATUS COMMUNICATION OF NEEDS Skin                               Personal Care Assistance Level of Assistance              Functional Limitations Info             SPECIAL CARE FACTORS FREQUENCY                       Contractures      Additional Factors Info                  Current Medications (10/23/2021):  This is the current hospital active medication list Current Facility-Administered Medications  Medication Dose Route Frequency Provider Last Rate Last Admin  acetaminophen (TYLENOL) tablet 650 mg  650 mg Oral Q6H PRN Opyd, Robert Qua, MD   650 mg at 10/22/21 1607   Or   acetaminophen (TYLENOL) suppository 650 mg  650 mg Rectal Q6H PRN Opyd, Robert Qua, MD       ALPRAZolam Duanne Moron) tablet 0.25 mg  0.25 mg Oral TID PRN Opyd, Robert Qua, MD   0.25 mg at 10/23/21 0201   amiodarone (PACERONE) tablet 200 mg  200 mg Oral BID Opyd, Robert Qua, MD   200 mg at 10/23/21 0851   amLODipine (NORVASC) tablet 10 mg  10 mg Oral Daily Opyd, Robert Qua, MD   10 mg at 10/23/21 0852   atorvastatin (LIPITOR) tablet 80 mg  80 mg Oral Daily Opyd, Robert Qua, MD   80 mg at 10/23/21 0852   DULoxetine (CYMBALTA) DR capsule 60 mg  60 mg Oral Daily Opyd, Robert Qua, MD   60 mg at 10/23/21 0851   enoxaparin (LOVENOX) injection 120 mg  120 mg Subcutaneous Q12H Robert Noble [place], DO   120 mg at 10/23/21 0841    gabapentin (NEURONTIN) capsule 200 mg  200 mg Oral Daily Opyd, Robert Qua, MD   200 mg at 10/23/21 0851   hydrALAZINE (APRESOLINE) injection 10 mg  10 mg Intravenous Q6H PRN Sheikh, Robert Latif, DO       hydrALAZINE (APRESOLINE) tablet 50 mg  50 mg Oral TID Sheikh, Robert Latif, DO       insulin aspart (novoLOG) injection 0-5 Units  0-5 Units Subcutaneous QHS Robert Bulls, MD   2 Units at 10/22/21 2218   insulin aspart (novoLOG) injection 0-9 Units  0-9 Units Subcutaneous TID WC Opyd, Robert Qua, MD   3 Units at 10/23/21 1216   labetalol (NORMODYNE) injection 10 mg  10 mg Intravenous Q2H PRN Sheikh, Robert Latif, DO   10 mg at 10/23/21 1048   levothyroxine (SYNTHROID) tablet 100 mcg  100 mcg Oral QAC breakfast Opyd, Robert Qua, MD   100 mcg at 10/23/21 0533   lisinopril (ZESTRIL) tablet 40 mg  40 mg Oral Daily Opyd, Robert Qua, MD   40 mg at 10/23/21 0851   MEDLINE mouth rinse  15 mL Mouth Rinse BID Sheikh, Robert Latif, DO   15 mL at 10/23/21 1050   metoprolol tartrate (LOPRESSOR) tablet 50 mg  50 mg Oral BID Robert Bulls, MD   50 mg at 10/23/21 2694   mycophenolate (CELLCEPT) capsule 1,000 mg  1,000 mg Oral Q12H Opyd, Robert Qua, MD   1,000 mg at 10/23/21 0851   ondansetron (ZOFRAN) tablet 4 mg  4 mg Oral Q6H PRN Opyd, Robert Qua, MD       Or   ondansetron (ZOFRAN) injection 4 mg  4 mg Intravenous Q6H PRN Opyd, Robert Qua, MD       predniSONE (DELTASONE) tablet 5 mg  5 mg Oral Q breakfast Opyd, Robert Qua, MD   5 mg at 10/23/21 0851   senna-docusate (Senokot-S) tablet 1 tablet  1 tablet Oral QHS PRN Opyd, Robert Qua, MD       sodium chloride flush (NS) 0.9 % injection 3 mL  3 mL Intravenous Q12H Opyd, Robert Qua, MD   3 mL at 10/23/21 1050   tamsulosin (FLOMAX) capsule 0.8 mg  0.8 mg Oral QHS Opyd, Robert Qua, MD   0.8 mg at 10/22/21 2202   warfarin (COUMADIN) tablet 5 mg  5 mg Oral ONCE-1600 Robert [name], [facility]  Warfarin - Pharmacist Dosing Inpatient   Does not apply q1600 Robert [name] [facility]          Discharge Medications: Please see discharge summary for a list of discharge medications.  Relevant Imaging Results:  Relevant Lab Results:   Additional Information    Robert [name] Telesha Deguzman, LCSWA

## 2021-10-23 NOTE — Progress Notes (Signed)
Inpatient Diabetes Program Recommendations  AACE/ADA: New Consensus Statement on Inpatient Glycemic Control (2015)  Target Ranges:  Prepandial:   less than 140 mg/dL      Peak postprandial:   less than 180 mg/dL (1-2 hours)      Critically ill patients:  140 - 180 mg/dL   Lab Results  Component Value Date   GLUCAP 216 (H) 10/23/2021   HGBA1C 7.1 (H) 10/03/2021   Review of Glycemic Control  Latest Reference Range & Units 10/22/21 07:22 10/22/21 12:12 10/22/21 16:13 10/22/21 18:19 10/22/21 22:15 10/23/21 08:13  Glucose-Capillary 70 - 99 mg/dL 169 (H) 199 (H) 287 (H) 246 (H) 246 (H) 216 (H)   Diabetes history: DM 2 Outpatient Diabetes medications: Novolog 0-9 units tid, Lantus 38 units bid Current orders for Inpatient glycemic control:  Novolog 0-9 units tid + hs scale  A1c 7.1% on 10/03/21 PO prednisone 5 mg Daily  Inpatient Diabetes Program Recommendations:    - Consider Novolog 2-3 units tid meal coverage if eating >50% of meals  Thanks,  Tama Headings RN, MSN, BC-ADM Inpatient Diabetes Coordinator Team Pager [phone] (8a-5p)

## 2021-10-23 NOTE — Consult Note (Addendum)
Cardiology Consultation:   Patient ID: Robert [name] MRN: [id number]; DOB: 04-Mar-1961  Admit date: 10/21/2021 Date of Consult: 10/23/2021  PCP:  Haywood Pao, MD   [facility] Providers Cardiologist:  Shelva Majestic, MD   Patient Profile:   Robert [name] is a 61 y.o. male with a history of CAD s/p recent CABG x1 (LIMA to LAD) on 10/05/2021, severe aortic stenosis s/p recent AVR on 10/05/2021, paroxysmal SVT, hypertension, hyperlipidemia, type 2 diabetes mellitus, s/p nephrectomy at age 65 for congential nonfunctional left kidney and subsequent ESRD previously on dialysis and then renal transplant in 2018, obstructive sleep apnea, and obesity who is being seen for the evaluation of hypertension at the request of Robert [name].  History of Present Illness:   Robert [name] is a 61 year old male with the above history who is followed by Robert [name].  Patient recently underwent CABG and AVR for severe aortic stenosis. Echo in 04/26/2021 showed LVEF of 60 to 65% with moderate LVH, grade 2 diastolic dysfunction, normal RV, moderate to severe AS with mean gradient of 44.4 mmHg, and mild MR.  He noted progressive dyspnea on exertion and office visit in 11/22.  Therefore, right/left cardiac catheterization was arranged and showed 80% stenosis of distal left main to proximal LAD followed by 90% stenosis of proximal to mid LAD, 80% stenosis of RPA V, 50% stenosis of OM2, and 20% stenosis of a lateral branch off the OM2.  Patient was referred to CT surgery and ultimately underwent a single-vessel CABG with LIMA to LAD and AVR with a pericardial valve on 10/05/2021 with Dr. Cyndia Bent.  Hospital course was complicated by postop atrial fibrillation requiring Amiodarone, worsening renal function, and perioperative CVA with multiple embolic lesions noted on MRI of the brain.  He was started on Coumadin and discharge on 10/18/2021.  Patient presented back to the ED on 10/21/2021 with dizziness and weakness.  He was  noted to be markedly hyper tensive on admission with BP as high as 204/119.  High-sensitivity troponin mildly elevated and flat consistent with demand ischemia.  He was given a dose of IV Labetalol in the ED with improvement and then admitted for further management of hypertensive urgency.  Home medications have been reintroduced but BP is still markedly elevated.  Therefore, Cardiology consulted for assistance.  Patient reports that each day following discharge to SNF he would have an episode of lightheadedness/dizziness and would find his BP was markedly elevated with systolic BP in the 254Y. On the day of admission, this happened again. He became lightheaded/dizziness. He asked SNF staff to come and check his BP but before they could do this, he "passed out" in his bed. He denies any other prodromal symptoms. He states that before most recent admission BP was elevated but not this high. BP was normally in the 130s/80s-90s in the morning and then increased to 140s-160s/90s-100s while at work. However, he states his BP was this high post-surgery. He denies any chest pain. He does sounds very winded while talking to me and he states he has been this way since surgery. He has not been ambulating much at SNF since he left but does state his breathing his worse with exertion. However, he denies any orthopnea, PND, or new/worsening lower extremity edema. No palpitations. He denies any other syncopal episodes other than the one described above. No recent fevers or illnesses. No abnormal bleeding on Coumadin.  Past Medical History:  Diagnosis Date   Anemia    low hgb at  present   Arthritis    FEET   Blood transfusion without reported diagnosis 1974   kidney removed - L   Cancer ([place])    skin ca right shoulder, plastic dsyplasia, pre-Ca polpys removed on Colonoscopy- 07/2014   Colon polyps 07/23/2014   Tubular adenomas x 5   Constipation    Coronary artery disease    Diabetes mellitus without complication  ([place])    Type 2   Difficult intubation    unsure of actual problem but it was during the January 28, 2015 procedure.   Dysrhythmia    Eczema    ESRD (end stage renal disease) ([place])    hemodialysis 06/2014-02/2017, s/p living unrelated kidney transplant 02/26/17   Headache(784.0)    slight dull h/a due kidney failure   Heart murmur    aortic stenosis (moderate-severe 04/2021)   Hyperlipidemia    Hypertension    SVT h/o , followed by Robert [name]   Hypothyroidism    Macular degeneration    Neuropathy    right hand and both feet   PAT (paroxysmal atrial tachycardia) (H[facility])    PSVT (paroxysmal supraventricular tachycardia) (H[facility])    SBO (small bowel obstruction) ([place]) 05/2015   Shortness of breath    with exertion   Sleep apnea 06/18/2011   split-night sleep study- [facility]., BiPAP- 13-16   Thyroid disease    Umbilical hernia    s/p repair 04/28/15    Past Surgical History:  Procedure Laterality Date   AORTIC VALVE REPLACEMENT N/A 10/05/2021   Procedure: AORTIC VALVE REPLACEMENT (AVR) WITH INSPIRIS RESILIA AORTIC VALVE SIZE 27MM;  Surgeon: [name], MD;  Location: [place];  Service: Open Heart Surgery;  Laterality: N/A;   AV FISTULA PLACEMENT Right 02/25/2015   Procedure: RIGHT ARTERIOVENOUS (AV) FISTULA CREATION;  Surgeon: Serafina Mitchell, MD;  Location: [place];  Service: Vascular;  Laterality: Right;   AV FISTULA PLACEMENT Right 09/15/2015   Procedure: ARTERIOVENOUS (AV) FISTULA CREATION- RIGHT ARM;  Surgeon: Serafina Mitchell, MD;  Location: [place];  Service: Vascular;  Laterality: Right;   CAPD INSERTION N/A 05/18/2014   Procedure: LAPAROSCOPIC INSERTION CONTINUOUS AMBULATORY PERITONEAL DIALYSIS  (CAPD) CATHETER;  Surgeon: Ralene Ok, MD;  Location: [place];  Service: General;  Laterality: N/A;   COLONOSCOPY     COLONOSCOPY WITH PROPOFOL N/A 07/12/2016   Procedure: COLONOSCOPY WITH PROPOFOL;  Surgeon: [name], MD;  Location: [facility];  Service:  Endoscopy;  Laterality: N/A;   CORONARY ARTERY BYPASS GRAFT N/A 10/05/2021   Procedure: CORONARY ARTERY BYPASS GRAFTING (CABG) X 1, ON PUMP, USING LEFT INTERNAL MAMMARY ARTERY CONDUIT;  Surgeon: [name], MD;  Location: [place];  Service: Open Heart Surgery;  Laterality: N/A;   declotting of fistula  08/2015   ESOPHAGOGASTRODUODENOSCOPY (EGD) WITH PROPOFOL N/A 07/12/2016   Procedure: ESOPHAGOGASTRODUODENOSCOPY (EGD) WITH PROPOFOL;  Surgeon: [name], MD;  [facility];  Service: Endoscopy;  Laterality: N/A;   FISTULA SUPERFICIALIZATION Right 02/07/2016   Procedure: RIIGHT UPPER ARM FISTULA SUPERFICIALIZATION;  Surgeon: Elam Dutch, MD;  Location: [place];  Service: Vascular;  Laterality: Right;   IJ catheter insertion     INSERTION OF DIALYSIS CATHETER Right 02/25/2015   Procedure: INSERTION OF RIGHT INTERNAL JUGULAR DIALYSIS CATHETER;  Surgeon: Serafina Mitchell, MD;  Location: [place];  Service: Vascular;  Laterality: Right;   KIDNEY TRANSPLANT     LAPAROSCOPIC REPOSITIONING CAPD CATHETER N/A 06/16/2014   Procedure: LAPAROSCOPIC REPOSITIONING  CAPD CATHETER;  Surgeon: Ralene Ok, MD;  Location: [place];  Service: General;  Laterality: N/A;   LAPAROSCOPY N/A 05/04/2015   Procedure: LAPAROSCOPY DIAGNOSTIC LYSIS OF ADHESIONS;  Surgeon: Coralie Keens, MD;  Location: [place];  Service: General;  Laterality: N/A;   MINOR REMOVAL OF PERITONEAL DIALYSIS CATHETER N/A 04/28/2015   Procedure:  REMOVAL OF PERITONEAL DIALYSIS CATHETER;  Surgeon: Coralie Keens, MD;  Location: [place];  Service: General;  Laterality: N/A;   NEPHRECTOMY Left 1974   RENAL BIOPSY Right 2012   REVISON OF ARTERIOVENOUS FISTULA Right 05/26/2015   Procedure: SUPERFICIALIZATION OF ARTERIOVENOUS FISTULA WITH SIDE BRANCH LIGATIONS;  Surgeon: Serafina Mitchell, MD;  Location: [place];  Service: Vascular;  Laterality: Right;   RIGHT/LEFT HEART CATH AND CORONARY ANGIOGRAPHY N/A 08/22/2021   Procedure: RIGHT/LEFT HEART CATH  AND CORONARY ANGIOGRAPHY;  Surgeon: Troy Sine, MD;  Location: [place] [facility];  Service: Cardiovascular;  Laterality: N/A;   SKIN CANCER EXCISION     right shoulder   SVT ABLATION N/A 11/23/2016   Procedure: SVT Ablation;  Surgeon: Will Meredith Leeds, MD;  Location: [place] [facility];  Service: Cardiovascular;  Laterality: N/A;   TEE WITHOUT CARDIOVERSION N/A 10/05/2021   Procedure: TRANSESOPHAGEAL ECHOCARDIOGRAM (TEE);  Surgeon: [name], MD;  Location: [place];  Service: Open Heart Surgery;  Laterality: N/A;   UMBILICAL HERNIA REPAIR N/A 05/18/2014   Procedure: HERNIA REPAIR UMBILICAL ADULT;  Surgeon: Ralene Ok, MD;  Location: [place];  Service: General;  Laterality: N/A;   UMBILICAL HERNIA REPAIR N/A 04/28/2015   Procedure: UMBILICAL HERNIA REPAIR WITH MESH;  Surgeon: Coralie Keens, MD;  Location: [place];  Service: General;  Laterality: N/A;     Home Medications:  Prior to Admission medications   Medication Sig Start Date End Date Taking? Authorizing Provider  acetaminophen (TYLENOL) 325 MG tablet Take 2 tablets (650 mg total) by mouth every 4 (four) hours as needed for moderate pain. 10/15/21  Yes Lars Pinks M, PA-C  amiodarone (PACERONE) 200 MG tablet Take 1 tablet (200 mg total) by mouth 2 (two) times daily. 10/18/21  Yes Gold, Wayne E, PA-C  amLODipine (NORVASC) 10 MG tablet TAKE 1 TABLET BY MOUTH EVERY DAY Patient taking differently: Take 10 mg by mouth daily. 09/11/21  Yes Troy Sine, MD  atorvastatin (LIPITOR) 80 MG tablet Take 1 tablet (80 mg total) by mouth daily. 10/19/21  Yes Gold, Wayne E, PA-C  belatacept (NULOJIX) 250 MG SOLR injection Inject 24.5 mLs into the vein every 28 (twenty-eight) days.  08/30/17  Yes [provider]  cholecalciferol (VITAMIN D3) 25 MCG (1000 UNIT) tablet Take 1,000 Units by mouth in the morning and at bedtime.   Yes [provider]  DULoxetine (CYMBALTA) 60 MG capsule Take 60 mg by mouth daily. 07/19/20   Yes [provider]  gabapentin (NEURONTIN) 100 MG capsule Take 200 mg by mouth daily. 06/02/16  Yes [provider]  hydrALAZINE (APRESOLINE) 25 MG tablet Take 1 tablet (25 mg total) by mouth 3 (three) times daily. Patient taking differently: Take 50 mg by mouth 3 (three) times daily. 10/18/21  Yes Gold, Wayne E, PA-C  insulin aspart (NOVOLOG) 100 UNIT/ML injection Inject 0-9 Units into the skin 3 (three) times daily with meals. Patient taking differently: Inject 0-9 Units into the skin 3 (three) times daily with meals. 70-120=0, 121-150=1 units, 151-200=2 units, 201-250=3 units, 251-300= 5units, 301-350= 7units, 351-400= 9 units, 401 NOTIFY MD/NP IF CBG IS >  400 10/18/21  Yes Gold, Wayne E, PA-C  insulin glargine (LANTUS) 100 unit/mL SOPN Inject 38 Units into the skin 2 (two) times daily.   Yes [provider]  levothyroxine (SYNTHROID) 100 MCG tablet TAKE 1 TABLET BY MOUTH EVERY DAY Patient taking differently: Take 100 mcg by mouth daily before breakfast. 06/13/21  Yes Elayne Snare, MD  lisinopril (ZESTRIL) 40 MG tablet Take 1 tablet (40 mg total) by mouth daily. 10/19/21  Yes Gold, Wayne E, PA-C  metoprolol tartrate (LOPRESSOR) 50 MG tablet Take 1 tablet (50 mg total) by mouth 2 (two) times daily. 06/16/21  Yes Troy Sine, MD  mycophenolate (CELLCEPT) 250 MG capsule Take 1,000 mg by mouth every 12 (twelve) hours.    Yes [provider]  predniSONE (DELTASONE) 5 MG tablet Take 5 mg by mouth daily with breakfast.   Yes [provider]  tamsulosin (FLOMAX) 0.4 MG CAPS capsule Take 0.8 mg by mouth at bedtime.    Yes [provider]  testosterone cypionate (DEPOTESTOSTERONE CYPIONATE) 200 MG/ML injection Inject 0.6 mLs (120 mg total) into the muscle every 7 (seven) days. 07/10/21  Yes Elayne Snare, MD  warfarin (COUMADIN) 2.5 MG tablet Take 1 tablet (2.5 mg total) by mouth daily at 4 PM. 10/18/21  Yes Gold, Patrick Jupiter E, PA-C  insulin detemir (LEVEMIR) 100  UNIT/ML injection Inject 0.38 mLs (38 Units total) into the skin 2 (two) times daily. Patient not taking: Reported on 10/21/2021 10/18/21   John Giovanni, PA-C  lactulose (CHRONULAC) 10 GM/15ML solution Take 10-20 g by mouth daily as needed (constipation). Patient not taking: Reported on 10/21/2021 02/23/15   [provider]  oxyCODONE (OXY IR/ROXICODONE) 5 MG immediate release tablet Take 1 tablet (5 mg total) by mouth every 6 (six) hours as needed for up to 7 days for severe pain. Patient not taking: Reported on 10/21/2021 10/18/21 10/25/21  John Giovanni, PA-C    Inpatient Medications: Scheduled Meds:  amiodarone  200 mg Oral BID   amLODipine  10 mg Oral Daily   atorvastatin  80 mg Oral Daily   DULoxetine  60 mg Oral Daily   enoxaparin (LOVENOX) injection  120 mg Subcutaneous Q12H   gabapentin  200 mg Oral Daily   hydrALAZINE  50 mg Oral TID   insulin aspart  0-5 Units Subcutaneous QHS   insulin aspart  0-9 Units Subcutaneous TID WC   insulin aspart  2 Units Subcutaneous TID WC   levothyroxine  100 mcg Oral QAC breakfast   lisinopril  40 mg Oral Daily   mouth rinse  15 mL Mouth Rinse BID   metoprolol tartrate  50 mg Oral BID   mycophenolate  1,000 mg Oral Q12H   predniSONE  5 mg Oral Q breakfast   sodium chloride flush  3 mL Intravenous Q12H   tamsulosin  0.8 mg Oral QHS   warfarin  5 mg Oral ONCE-1600   Warfarin - Pharmacist Dosing Inpatient   Does not apply q1600   Continuous Infusions:  PRN Meds: acetaminophen **OR** acetaminophen, ALPRAZolam, hydrALAZINE, labetalol, ondansetron **OR** ondansetron (ZOFRAN) IV, senna-docusate  Allergies:   No Known Allergies  Social History:   Social History   Socioeconomic History   Marital status: Married    Spouse name: Not on file   Number of children: 3   Years of education: Not on file   Highest education level: Not on file  Occupational History   Occupation: Training and development officer: [employer]  Tobacco  Use   Smoking status:  Former    Types: Cigars    Quit date: 12/06/2012    Years since quitting: 8.8   Smokeless tobacco: Never  Vaping Use   Vaping Use: Never used  Substance and Sexual Activity   Alcohol use: Yes    Comment: once/month   Drug use: No   Sexual activity: Not on file  Other Topics Concern   Not on file  Social History Narrative   Not on file   Social Determinants of Health   Financial Resource Strain: Not on file  Food Insecurity: Not on file  Transportation Needs: Not on file  Physical Activity: Not on file  Stress: Not on file  Social Connections: Not on file  Intimate Partner Violence: Not on file    Family History:   Family History  Problem Relation Age of Onset   Colon polyps Father    Colon cancer Neg Hx    Stomach cancer Neg Hx      ROS:  Please see the history of present illness.  Review of Systems  Constitutional:  Negative for fever.  HENT:  Negative for congestion.   Respiratory:  Positive for cough (chronic) and shortness of breath.   Cardiovascular:  Positive for leg swelling (chronic and stable). Negative for chest pain, palpitations, orthopnea and PND.  Gastrointestinal:  Negative for blood in stool and melena.  Genitourinary:  Negative for hematuria.  Musculoskeletal:  Negative for falls.  Neurological:  Positive for dizziness, loss of consciousness and weakness (general weakness post surgery).  Endo/Heme/Allergies:  Does not bruise/bleed easily.  Psychiatric/Behavioral:  The patient is nervous/anxious.     Physical Exam/Data:   Vitals:   10/23/21 1044 10/23/21 1216 10/23/21 1243 10/23/21 1355  BP: (!) 168/78 (!) 173/85 (!) 196/89   Pulse: 67 65 67   Resp: 18 18 20 20   Temp:  (!) 97.3 F (36.3 C)    TempSrc:  Oral    SpO2:      Weight:      Height:        Intake/Output Summary (Last 24 hours) at 10/23/2021 1430 Last data filed at 10/23/2021 0507 Gross per 24 hour  Intake 480 ml  Output 450 ml  Net 30 ml   Last 3 Weights 10/21/2021 10/18/2021  10/17/2021  Weight (lbs) 270 lb 287 lb 11.2 oz 292 lb 1.8 oz  Weight (kg) 122.471 kg 130.5 kg 132.5 kg     Body mass index is 37.66 kg/m.  General: 61 y.o. morbidly obese Caucasian male resting comfortably in no acute distress. HEENT: Normocephalic and atraumatic. Sclera clear.  Neck: Supple. JVD difficult to assess due to body habitus. Heart: RRR. Distinct S1 and S2. Possible soft systolic murmur. Radial and distal pedal pulses 2+ and equal bilaterally. Lungs: Mild increased work of breathing. Labored breathing while talking. Clear to ausculation bilaterally. No wheezes, rhonchi, or rales.  Abdomen: Soft, non-distended, and non-tender to palpation.  MSK: Normal strength and tone for age. Extremities: No significant lower extremity edema.    Skin: Warm and dry. Neuro: Alert and oriented x3. No focal deficits. Psych: Normal affect. Responds appropriately.  EKG:  The EKG was personally reviewed and demonstrates: Normal sinus rhythm, rate 66 bpm, with LVH and repolarization changes but no significant changes compared to prior tracing.  Telemetry:  Telemetry was personally reviewed and demonstrates:  Normal sinus rhythm with rates in the 60s to 70s.  Relevant CV Studies:  Echocardiogram 05/02/2021: Impressions: 1. Left ventricular ejection  fraction, by estimation, is 60 to 65%. The  left ventricle has normal function. The left ventricle has no regional  wall motion abnormalities. There is moderate left ventricular hypertrophy.  Left ventricular diastolic  parameters are consistent with Grade II diastolic dysfunction  (pseudonormalization). Elevated left ventricular end-diastolic pressure.   2. Right ventricular systolic function is normal. The right ventricular  size is normal. There is moderately elevated pulmonary artery systolic  pressure.   3. Left atrial size was moderately dilated.   4. The mitral valve is abnormal. Mild mitral valve regurgitation.  Moderate to severe mitral  annular calcification.   5. The aortic valve is tricuspid. There is moderate calcification of the  aortic valve. Aortic valve regurgitation is trivial. Moderate to severe  aortic valve stenosis. Aortic valve area, by VTI measures 1.09 cm. Aortic  valve mean gradient measures 44.4   mmHg. Aortic valve Vmax measures 3.99 m/s. DI is 0.29 - based on LVOT  diameter of 2.2 cm.   6. Aortic dilatation noted. There is mild dilatation of the ascending  aorta, measuring 42 mm.   7. The inferior vena cava is dilated in size with >50% respiratory  variability, suggesting right atrial pressure of 8 mmHg.   Comparison(s): Changes from prior study are noted. 11/16/2020: LVEF 65-70%,  moderate AS -mean gradient 34.6 mmHg.  _______________  Right/Left Cardiac Catheterization 08/22/2021:   2nd Mrg lesion is 50% stenosed.   Lat 2nd Mrg lesion is 20% stenosed.   RPAV lesion is 80% stenosed.   Dist LM to Prox LAD lesion is 80% stenosed.   Prox LAD to Mid LAD lesion is 90% stenosed.   LV end diastolic pressure is mildly elevated.  Diagnostic Dominance: Right   Laboratory Data:  High Sensitivity Troponin:   Recent Labs  Lab 10/21/21 1634 10/21/21 1811  TROPONINIHS 144* 154*     Chemistry Recent Labs  Lab 10/21/21 1634 10/22/21 0411 10/23/21 0227  NA 133* 137 134*  K 3.9 4.2 4.1  CL 105 105 106  CO2 21* 23 19*  GLUCOSE 214* 168* 224*  BUN 33* 31* 36*  CREATININE 2.03* 2.06* 2.17*  CALCIUM 9.1 9.5 9.4  MG  --  1.8 1.8  GFRNONAA 36* 36* 34*  ANIONGAP 7 9 9     Recent Labs  Lab 10/21/21 1634 10/23/21 0227  PROT 5.6* 5.3*  ALBUMIN 3.3* 3.0*  AST 18 14*  ALT 23 19  ALKPHOS 102 88  BILITOT 0.8 0.9   Lipids No results for input(s): CHOL, TRIG, HDL, LABVLDL, LDLCALC, CHOLHDL in the last 168 hours.  Hematology Recent Labs  Lab 10/21/21 1634 10/22/21 0411 10/23/21 0227  WBC 8.4 9.6 8.2  RBC 4.38 4.34 4.29  HGB 12.3* 12.5* 12.1*  HCT 38.7* 38.3* 37.5*  MCV 88.4 88.2 87.4  MCH  28.1 28.8 28.2  MCHC 31.8 32.6 32.3  RDW 14.5 14.6 14.6  PLT 376 338 284   Thyroid No results for input(s): TSH, FREET4 in the last 168 hours.  BNP Recent Labs  Lab 10/21/21 1634  BNP 324.0*    DDimer No results for input(s): DDIMER in the last 168 hours.   Radiology/Studies:  CT HEAD WO CONTRAST (5MM)  Result Date: 10/21/2021 CLINICAL DATA:  Headache, dizziness, weakness EXAM: CT HEAD WITHOUT CONTRAST TECHNIQUE: Contiguous axial images were obtained from the base of the skull through the vertex without intravenous contrast. RADIATION DOSE REDUCTION: This exam was performed according to the departmental dose-optimization program which includes automated exposure control, adjustment  of the mA and/or kV according to patient size and/or use of iterative reconstruction technique. COMPARISON:  10/07/2021, 10/11/2021 FINDINGS: Brain: Hypodensities within the left frontal parietal periventricular white matter consistent with areas of acute infarct seen on previous MRI. No new infarct or hemorrhage. Lateral ventricles and midline structures are unremarkable. No acute extra-axial fluid collections. No mass effect. Vascular: No hyperdense vessel or unexpected calcification. Stable atherosclerosis. Skull: Normal. Negative for fracture or focal lesion. Sinuses/Orbits: No acute finding. Other: None. IMPRESSION: 1. Subacute small vessel ischemic changes within the left frontal parietal white matter, corresponding to previous MRI findings. 2. No acute infarct or hemorrhage. Electronically Signed   By: Randa Ngo M.D.   On: 10/21/2021 19:11   DG Chest Portable 1 View  Result Date: 10/21/2021 CLINICAL DATA:  61 year old male with history of shortness of breath, dizziness and weakness. EXAM: PORTABLE CHEST 1 VIEW COMPARISON:  Chest x-ray 10/09/2021. FINDINGS: Bibasilar opacities may reflect areas of atelectasis and/or consolidation, with superimposed small bilateral pleural effusions. No pneumothorax. No  evidence of pulmonary edema. Heart size is mildly enlarged. Upper mediastinal contours are within normal limits. Status post median sternotomy for aortic valve replacement with what appears to be a stented bioprosthesis. IMPRESSION: 1. Bibasilar areas of atelectasis and/or consolidation with superimposed small bilateral pleural effusions. Electronically Signed   By: Vinnie Langton M.D.   On: 10/21/2021 12:09     Assessment and Plan:   Syncope Hypertensive Urgency Patient presented with dizziness followed by syncope in the setting of hypertensive urgency. BP as high as the 220s/110s this admission. He was gradually restarted on his home BP medications but BP still markedly elevated. - Current home regimen: Amlodipine 10mg  daily, Lisinopril 40mg  daily, Lopressor 50mg  daily, Hydralazine 50mg  three times daily. This is what he is currently on here as well with PRN Hydralazine and PRN Labetalol ordered as well. - Will stop Lopressor and switch to Coreg 12.5mg  twice daily. If heart rate will allows, can go up to 25mg  twice daily. - Will increase Hydralazine to 100mg  three times daily. - May end up needing to add Imdur. - In regards to syncopal episode, thinks this was secondary to marked hypertension. No concerning arrhythmias noted on telemetry. Will continue to monitor on telemetry while here.  Dyspnea Patient is very noticeably short of breath. He is not hypoxic and does not appear grossly volume overloaded. Chest x-ray showed bibasilar areas of atelectasis and/or consolidation with superimposed small bilateral pleural effusion. BNP mildly elevated in the 300s. - Will get limited Echo. - Will also order V/Q scan to make sure he does not have a PE given syncopal episode and degree of dyspnea  Elevated Troponin High-sensitivity troponin 144 >> 154. EKG shows no acute changes. Patient denies any chest pain.  - Troponin elevation consistent with demand ischemia in the setting of hypertensive urgency  and recent surgery.  - No additional ischemic work-up necessary at this time.  CAD s/p Recent CABG S/p recent CABG with LIMA to LAD on 10/05/2021. - No angina. - No aspirin due to need for Coumadin. - Continue beta-blocker and high-intensity statin.  Severe Aortic Stenosis s/p Recent AVR S/p bioprosthetic AVR on 10/05/2021. - On Coumadin given post-op atrial fibrillation and CVA.  Post-Op Atrial Fibrillation Following recent CABG/AVR. Converted with Amiodarone. - Maintaining sinus rhythm. - Continue Amiodarone 200mg  twice daily.  - Continue beta-blocker as above. - Continue Coumadin. INR 1.5 today (goal 2.0-2.5). Dosing per Pharmacy.  Recent CVA Following CABG/AVR, had acute CVA. MRI  showed multiple acute to subacute small infarcts in multiple vascular territories raising the possibility of recent embolic event. Repeat head CT this admission showed subacute small vessel ischemic changes corresponding to recent MR findings but no acute infarct or hemorrhage. - Continue Coumadin as above.  Hyperlipidemia - Continue home Lipitor 80mg  daily.  Type 2 Diabetes Mellitus - Management per primary team.  CKD Stage IIIb S/p Renal Transplant Patient has a history of a nephrectomy at age 24 for congential nonfunctional left kidney and subsequent ESRD previously on dialysis and then renal transplant in 2018. Baseline creatinine around 2.0 to 2.3. - Creatinine stable at 2.17 today. - On Cellcept, Belatacept, and Prednisone. - Followed at [facility] as an outpatient.   Obstructive Sleep Apnea - Continue BiPAP at night.   Risk Assessment/Risk Scores:   CHA2DS2-VASc Score = 5  This indicates a 7.2% annual risk of stroke. The patient's score is based upon: CHF History: 0 HTN History: 1 Diabetes History: 1 Stroke History: 2 Vascular Disease History: 1 Age Score: 0 Gender Score: 0  For questions or updates, please contact [facility] Please consult [email or web address] for contact info under     Signed, Robert Mclean, PA-C  10/23/2021 2:30 PM  Patient seen and examined and agree with Sande Rives, PA-C as detailed above.  In brief, the patient is a 61 y.o. male with a history of CAD s/p recent CABG x1 (LIMA to LAD) on 10/05/2021, severe aortic stenosis s/p recent AVR on 10/05/2021, paroxysmal SVT, hypertension, hyperlipidemia, type 2 diabetes mellitus, s/p nephrectomy at age 15 for congential nonfunctional left kidney and subsequent ESRD previously on dialysis and then renal transplant in 2018, obstructive sleep apnea, and obesity who presented to the ER with dizziness and weakness found to have severely elevated for which Cardiology has been consulted.  Patient had recent CABG AVR on 10/05/21 with post-op course complicated by Afib requiring amiodarone, AKI on CKD, and perioperative CVA with multiple embolic lesions on MRI. He was initiated on warfarin and discharged to SNF. At rehab, he has been having episodes of dizziness/lightheadedness where his BP is markedly elevated to the 200s. Has had a history of HTN prior to surgery but has never run this high. He denies chest pain but has been having ongoing short windedness. Recent INRs subtherapeutic.   Here, his SBPs continue to be elevated. He is notably dyspneic on exam but is not hypoxic or tachycardic. No orthopnea, PND or significant LE edema. Unclear etiology of SOB, however, given degree of dyspnea on exam as well as episodes of low INR, will check V/Q to ensure no PE and TTE. Will also adjust BP medications as detailed above and below.  GEN: Speaking in 1-2 word sentences but not hypoxic Neck: JVD difficult to assess due to body habitus Cardiac: RRR, 2/6 systolic murmur Respiratory: Diminished at the bases but otherwise clear GI: Obese, soft, ND MS: Minimal edema; warm Neuro:  Nonfocal  Psych: Normal affect    Plan: -Patient is not hypoxic or tachycardic but has visible dyspnea on exam and an episode of syncope;  will check V/Q and TTE to work-up further -Continue amlodipine 10mg  daily -Continue lisinopril 40mg  daily -Change metoprolol to coreg 12.5mg  BID and up-titrate as able -Increase hydralazine to 100mg  TID -Can add imdur pending blood  pressures -If BP fails to improve, will check renal doppler -Warfarin per pharmacy -Continue amiodarone 200mg  BID -Renal function stable post-discharge; continue immunosuppressants  Gwyndolyn Kaufman, MD

## 2021-10-24 ENCOUNTER — Inpatient Hospital Stay (HOSPITAL_COMMUNITY): Payer: 59

## 2021-10-24 DIAGNOSIS — R0609 Other forms of dyspnea: Secondary | ICD-10-CM

## 2021-10-24 LAB — CBC WITH DIFFERENTIAL/PLATELET
Abs Immature Granulocytes: 0.04 10*3/uL (ref 0.00–0.07)
Basophils Absolute: 0 10*3/uL (ref 0.0–0.1)
Basophils Relative: 1 %
Eosinophils Absolute: 0.2 10*3/uL (ref 0.0–0.5)
Eosinophils Relative: 3 %
HCT: 36.9 % — ABNORMAL LOW (ref 39.0–52.0)
Hemoglobin: 11.9 g/dL — ABNORMAL LOW (ref 13.0–17.0)
Immature Granulocytes: 1 %
Lymphocytes Relative: 12 %
Lymphs Abs: 0.8 10*3/uL (ref 0.7–4.0)
MCH: 28.2 pg (ref 26.0–34.0)
MCHC: 32.2 g/dL (ref 30.0–36.0)
MCV: 87.4 fL (ref 80.0–100.0)
Monocytes Absolute: 0.7 10*3/uL (ref 0.1–1.0)
Monocytes Relative: 10 %
Neutro Abs: 4.9 10*3/uL (ref 1.7–7.7)
Neutrophils Relative %: 73 %
Platelets: 268 10*3/uL (ref 150–400)
RBC: 4.22 MIL/uL (ref 4.22–5.81)
RDW: 14.9 % (ref 11.5–15.5)
WBC: 6.6 10*3/uL (ref 4.0–10.5)
nRBC: 0 % (ref 0.0–0.2)

## 2021-10-24 LAB — ECHOCARDIOGRAM LIMITED
AR max vel: 2.14 cm2
AV Area VTI: 2.14 cm2
AV Area mean vel: 2.3 cm2
AV Mean grad: 12 mmHg
AV Peak grad: 21.7 mmHg
Ao pk vel: 2.33 m/s
Area-P 1/2: 2.61 cm2
Height: 71 in
S' Lateral: 3.3 cm
Weight: 4320 oz

## 2021-10-24 LAB — COMPREHENSIVE METABOLIC PANEL
ALT: 17 U/L (ref 0–44)
AST: 13 U/L — ABNORMAL LOW (ref 15–41)
Albumin: 3.1 g/dL — ABNORMAL LOW (ref 3.5–5.0)
Alkaline Phosphatase: 90 U/L (ref 38–126)
Anion gap: 9 (ref 5–15)
BUN: 37 mg/dL — ABNORMAL HIGH (ref 8–23)
CO2: 20 mmol/L — ABNORMAL LOW (ref 22–32)
Calcium: 9.6 mg/dL (ref 8.9–10.3)
Chloride: 106 mmol/L (ref 98–111)
Creatinine, Ser: 2.12 mg/dL — ABNORMAL HIGH (ref 0.61–1.24)
GFR, Estimated: 35 mL/min — ABNORMAL LOW (ref >60)
Glucose, Bld: 174 mg/dL — ABNORMAL HIGH (ref 70–99)
Potassium: 4.2 mmol/L (ref 3.5–5.1)
Sodium: 135 mmol/L (ref 135–145)
Total Bilirubin: 0.8 mg/dL (ref 0.3–1.2)
Total Protein: 5.2 g/dL — ABNORMAL LOW (ref 6.5–8.1)

## 2021-10-24 LAB — BASIC METABOLIC PANEL
Anion gap: 8 (ref 5–15)
BUN: 34 mg/dL — ABNORMAL HIGH (ref 8–23)
CO2: 20 mmol/L — ABNORMAL LOW (ref 22–32)
Calcium: 9.7 mg/dL (ref 8.9–10.3)
Chloride: 105 mmol/L (ref 98–111)
Creatinine, Ser: 2.04 mg/dL — ABNORMAL HIGH (ref 0.61–1.24)
GFR, Estimated: 36 mL/min — ABNORMAL LOW (ref >60)
Glucose, Bld: 175 mg/dL — ABNORMAL HIGH (ref 70–99)
Potassium: 4.2 mmol/L (ref 3.5–5.1)
Sodium: 133 mmol/L — ABNORMAL LOW (ref 135–145)

## 2021-10-24 LAB — PROTIME-INR
INR: 1.6 — ABNORMAL HIGH (ref 0.8–1.2)
Prothrombin Time: 18.7 seconds — ABNORMAL HIGH (ref 11.4–15.2)

## 2021-10-24 LAB — GLUCOSE, CAPILLARY
Glucose-Capillary: 184 mg/dL — ABNORMAL HIGH (ref 70–99)
Glucose-Capillary: 207 mg/dL — ABNORMAL HIGH (ref 70–99)
Glucose-Capillary: 177 mg/dL — ABNORMAL HIGH (ref 70–99)
Glucose-Capillary: 197 mg/dL — ABNORMAL HIGH (ref 70–99)

## 2021-10-24 LAB — MAGNESIUM: Magnesium: 1.8 mg/dL (ref 1.7–2.4)

## 2021-10-24 LAB — PHOSPHORUS: Phosphorus: 3.2 mg/dL (ref 2.5–4.6)

## 2021-10-24 MED ORDER — TECHNETIUM TO 99M ALBUMIN AGGREGATED
4.1000 | Freq: Once | INTRAVENOUS | Status: AC | PRN
  Administered 2021-10-24: 4.1 via INTRAVENOUS

## 2021-10-24 MED ORDER — WARFARIN SODIUM 5 MG PO TABS
6.0000 mg | ORAL_TABLET | Freq: Once | ORAL | Status: AC
  Administered 2021-10-24: 6 mg via ORAL
  Filled 2021-10-24: qty 1

## 2021-10-24 MED ORDER — CLONAZEPAM 0.125 MG PO TBDP
0.1250 mg | ORAL_TABLET | Freq: Two times a day (BID) | ORAL | Status: DC
  Administered 2021-10-24 – 2021-10-25 (×2): 0.125 mg via ORAL
  Filled 2021-10-24 (×2): qty 1

## 2021-10-24 MED ORDER — ISOSORBIDE MONONITRATE ER 30 MG PO TB24
30.0000 mg | ORAL_TABLET | Freq: Every day | ORAL | Status: DC
  Administered 2021-10-24 – 2021-10-25 (×2): 30 mg via ORAL
  Filled 2021-10-24 (×2): qty 1

## 2021-10-24 MED ORDER — CARVEDILOL 25 MG PO TABS
25.0000 mg | ORAL_TABLET | Freq: Two times a day (BID) | ORAL | Status: DC
  Administered 2021-10-24 – 2021-10-26 (×4): 25 mg via ORAL
  Filled 2021-10-24 (×4): qty 1

## 2021-10-24 NOTE — Progress Notes (Signed)
PT Cancellation Note  Patient Details Name: Robert [name] MRN: [id number] DOB: 02/26/1961   Cancelled Treatment:    Reason Eval/Treat Not Completed: Other (comment) Checked on pt in am and he reports "on verge of panic attack."  Pt was shaking legs, increased resp rate, red in face, all VSS.  Spoke with RN who reports aware and pt medicated.  Checked back in pm and pt with similar presentation.  Not able to participate with therapy.  Will f/u at later date.  [name], PT Acute Rehab Services Pager [phone] [facility] [place] 10/24/2021, 2:32 PM

## 2021-10-24 NOTE — Progress Notes (Signed)
Pt resting comfortably on home cpap at this time.  RT will cont to monitor.

## 2021-10-24 NOTE — Progress Notes (Addendum)
ANTICOAGULATION CONSULT NOTE  Pharmacy Consult for enoxaparin>warfarin Indication: atrial fibrillation  No Known Allergies  Patient Measurements: Height: 5\' 11"  (180.3 cm) Weight: 122.5 kg (270 lb) IBW/kg (Calculated) : 75.3  Vital Signs: Temp: 96.4 F (35.8 C) (01/17 0532) Temp Source: Oral (01/17 0532) BP: 174/82 (01/17 0532) Pulse Rate: 68 (01/17 0532)  Labs: Recent Labs    10/21/21 1634 10/21/21 1811 10/22/21 0411 10/23/21 0227 10/24/21 0305  HGB 12.3*  --  12.5* 12.1* 11.9*  HCT 38.7*  --  38.3* 37.5* 36.9*  PLT 376  --  338 284 268  LABPROT 15.9*  --  15.8* 17.6*  --   INR 1.3*  --  1.3* 1.5*  --   CREATININE 2.03*  --  2.06* 2.17* 2.12*  TROPONINIHS 144* 154*  --   --   --      Estimated Creatinine Clearance: 48.1 mL/min (A) (by C-G formula based on SCr of 2.12 mg/dL (H)).   Medical History: Past Medical History:  Diagnosis Date   Anemia    low hgb at present   Arthritis    FEET   Blood transfusion without reported diagnosis 1974   kidney removed - L   Cancer ([place])    skin ca right shoulder, plastic dsyplasia, pre-Ca polpys removed on Colonoscopy- 07/2014   Colon polyps 07/23/2014   Tubular adenomas x 5   Constipation    Coronary artery disease    Diabetes mellitus without complication ([place])    Type 2   Difficult intubation    unsure of actual problem but it was during the January 28, 2015 procedure.   Dysrhythmia    Eczema    ESRD (end stage renal disease) ([place])    hemodialysis 06/2014-02/2017, s/p living unrelated kidney transplant 02/26/17   Headache(784.0)    slight dull h/a due kidney failure   Heart murmur    aortic stenosis (moderate-severe 04/2021)   Hyperlipidemia    Hypertension    SVT h/o , followed by Dr. Claiborne Billings   Hypothyroidism    Macular degeneration    Neuropathy    right hand and both feet   PAT (paroxysmal atrial tachycardia) ([facility])    PSVT (paroxysmal supraventricular tachycardia) ([facility])    SBO (small bowel obstruction) ([place])  05/2015   Shortness of breath    with exertion   Sleep apnea 06/18/2011   split-night sleep study- [facility]., BiPAP- 13-16   Thyroid disease    Umbilical hernia    s/p repair 04/28/15   Assessment: 17 yom s/p CABG and AVR 2 weeks ago. Warfarin per pharmacy consult placed for AFib.  Home dose of warfarin is 2.5 mg daily. Patient last taken 1/13 at 1800.  INR still low today at 1.6. CBC stable. No bleeding issues noted. Lovenox bridge started 1/15 d/t patient's history of embolic stroke.   Goal of Therapy:  INR 2-2.5 (per 1/6 note by MD Bartle) Monitor platelets by anticoagulation protocol: Yes   Plan:  Warfarin 6mg  tonight  Continue Lovenox 120 mg q12 hours Monitor for DDIs Monitor CBC, Daily INR  Thank you, Erin Hearing PharmD., BCPS Clinical Pharmacist 10/24/2021 8:00 AM

## 2021-10-24 NOTE — Progress Notes (Signed)
Orthopedic Tech Progress Note Patient Details:  THAILAN SAVA January 15, 1961 [id number]  RN called requesting a [place] for patient but she wasn't sure which room stated room 8 or 9. Checking orders now   Patient ID: TASHON CAPP, male   DOB: October 18, 1960, 61 y.o.   MRN: [id number]  Janit Pagan 10/24/2021, 2:24 PM

## 2021-10-24 NOTE — Progress Notes (Addendum)
Cardiology Consultation:   Patient ID: Robert [name] MRN: [id number]; DOB: [birth year]  Admit date: 10/21/2021 Date of Consult: 10/23/2021  PCP:  Haywood Pao, MD   [facility] Providers Cardiologist:  Shelva Majestic, MD   Patient Profile:   Robert [name] is a 61 y.o. male with a history of CAD s/p recent CABG x1 (LIMA to LAD) on 10/05/2021, severe aortic stenosis s/p recent AVR on 10/05/2021, paroxysmal SVT, hypertension, hyperlipidemia, type 2 diabetes mellitus, s/p nephrectomy at age 55 for congential nonfunctional left kidney and subsequent ESRD previously on dialysis and then renal transplant in 2018, obstructive sleep apnea, and obesity who is being seen for the evaluation of hypertension at the request of Dr. Alfredia Ferguson.  History of Present Illness:   Robert [name] he developed chest pain this morning but it was preceded by feeling of anxiousness with dyspnea. He feels it may have been an anxiety attack. CP has self-resolved. He notes that he has been experiencing increasing anxiety since his last hospitalization. He was started on Xanax during this hospitalization but does not feel it lasts long enough. He would be willing to take an SSRI.   Otherwise, he notes his dyspnea has improved somewhat but still present. He endorses nasal congestion. NO other complaints at this time.   Overnight, patient required PRN Hydralazine for BP of 174/82.   Inpatient Medications: Scheduled Meds:  amiodarone  200 mg Oral BID   amLODipine  10 mg Oral Daily   atorvastatin  80 mg Oral Daily   DULoxetine  60 mg Oral Daily   enoxaparin (LOVENOX) injection  120 mg Subcutaneous Q12H   gabapentin  200 mg Oral Daily   hydrALAZINE  50 mg Oral TID   insulin aspart  0-5 Units Subcutaneous QHS   insulin aspart  0-9 Units Subcutaneous TID WC   insulin aspart  2 Units Subcutaneous TID WC   levothyroxine  100 mcg Oral QAC breakfast   lisinopril  40 mg Oral Daily   mouth rinse  15 mL Mouth  Rinse BID   metoprolol tartrate  50 mg Oral BID   mycophenolate  1,000 mg Oral Q12H   predniSONE  5 mg Oral Q breakfast   sodium chloride flush  3 mL Intravenous Q12H   tamsulosin  0.8 mg Oral QHS   warfarin  5 mg Oral ONCE-1600   Warfarin - Pharmacist Dosing Inpatient   Does not apply q1600   Continuous Infusions:  PRN Meds: acetaminophen **OR** acetaminophen, ALPRAZolam, hydrALAZINE, labetalol, ondansetron **OR** ondansetron (ZOFRAN) IV, senna-docusate  Allergies:   No Known Allergies  Social History:   Social History   Socioeconomic History   Marital status: Married    Spouse name: Not on file   Number of children: 3   Years of education: Not on file   Highest education level: Not on file  Occupational History   Occupation: Training and development officer: [employer]  Tobacco Use   Smoking status: Former    Types: Cigars    Quit date: 12/06/2012    Years since quitting: 8.8   Smokeless tobacco: Never  Vaping Use   Vaping Use: Never used  Substance and Sexual Activity   Alcohol use: Yes    Comment: once/month   Drug use: No   Sexual activity: Not on file  Other Topics Concern   Not on file  Social History Narrative   Not on file   Social Determinants of Health   Financial Resource Strain: Not on  file  Food Insecurity: Not on file  Transportation Needs: Not on file  Physical Activity: Not on file  Stress: Not on file  Social Connections: Not on file  Intimate Partner Violence: Not on file    Family History:   Family History  Problem Relation Age of Onset   Colon polyps Father    Colon cancer Neg Hx    Stomach cancer Neg Hx      ROS:  Negative except as noted above.   Physical Exam/Data:   Vitals:   10/23/21 1044 10/23/21 1216 10/23/21 1243 10/23/21 1355  BP: (!) 168/78 (!) 173/85 (!) 196/89   Pulse: 67 65 67   Resp: 18 18 20 20   Temp:  (!) 97.3 F (36.3 C)    TempSrc:  Oral    SpO2:      Weight:      Height:        Intake/Output Summary (Last 24  hours) at 10/23/2021 1430 Last data filed at 10/23/2021 0507 Gross per 24 hour  Intake 480 ml  Output 450 ml  Net 30 ml   Last 3 Weights 10/21/2021 10/18/2021 10/17/2021  Weight (lbs) 270 lb 287 lb 11.2 oz 292 lb 1.8 oz  Weight (kg) 122.471 kg 130.5 kg 132.5 kg     Body mass index is 37.66 kg/m.  General: 61 y.o. morbidly obese Caucasian male resting comfortably in no acute distress. HEENT: Normocephalic and atraumatic. Sclera clear.  Neck: Supple. No JVD.  Heart: RRR. Normal S1 and S2. 2/6 systolic murmur heard best in the RUSB.  Lungs: Minimal tachypnea noted, however no accessory muscle use. Saturating >95% on room air. Fine crackles in the bibasilar and left middle lung fields. No wheezing or rhonchi.  Abdomen: Soft, non-distended, and non-tender to palpation.  MSK: Normal strength and tone for age. Extremities: Trace pitting edema of the bilateral lower extremities.    Skin: Warm and dry. Neuro: Alert and oriented x3. No focal deficits. Psych: Normal affect. Responds appropriately.  EKG:  Sinus rhythm with no acute ischemic changes noted. J point waves present.   Telemetry:  Telemetry was personally reviewed and demonstrates:  Normal sinus rhythm. No PVCs or PACs. Rates in the 70-80s.   Relevant CV Studies:  Echocardiogram 05/02/2021: Impressions: 1. Left ventricular ejection fraction, by estimation, is 60 to 65%. The  left ventricle has normal function. The left ventricle has no regional  wall motion abnormalities. There is moderate left ventricular hypertrophy.  Left ventricular diastolic  parameters are consistent with Grade II diastolic dysfunction  (pseudonormalization). Elevated left ventricular end-diastolic pressure.   2. Right ventricular systolic function is normal. The right ventricular  size is normal. There is moderately elevated pulmonary artery systolic  pressure.   3. Left atrial size was moderately dilated.   4. The mitral valve is abnormal. Mild mitral  valve regurgitation.  Moderate to severe mitral annular calcification.   5. The aortic valve is tricuspid. There is moderate calcification of the  aortic valve. Aortic valve regurgitation is trivial. Moderate to severe  aortic valve stenosis. Aortic valve area, by VTI measures 1.09 cm. Aortic  valve mean gradient measures 44.4   mmHg. Aortic valve Vmax measures 3.99 m/s. DI is 0.29 - based on LVOT  diameter of 2.2 cm.   6. Aortic dilatation noted. There is mild dilatation of the ascending  aorta, measuring 42 mm.   7. The inferior vena cava is dilated in size with >50% respiratory  variability, suggesting right atrial pressure  of 8 mmHg.   Comparison(s): Changes from prior study are noted. 11/16/2020: LVEF 65-70%,  moderate AS -mean gradient 34.6 mmHg.  _______________  Right/Left Cardiac Catheterization 08/22/2021:   2nd Mrg lesion is 50% stenosed.   Lat 2nd Mrg lesion is 20% stenosed.   RPAV lesion is 80% stenosed.   Dist LM to Prox LAD lesion is 80% stenosed.   Prox LAD to Mid LAD lesion is 90% stenosed.   LV end diastolic pressure is mildly elevated.  Diagnostic Dominance: Right   Laboratory Data:  High Sensitivity Troponin:   Recent Labs  Lab 10/21/21 1634 10/21/21 1811  TROPONINIHS 144* 154*     Chemistry Recent Labs  Lab 10/21/21 1634 10/22/21 0411 10/23/21 0227  NA 133* 137 134*  K 3.9 4.2 4.1  CL 105 105 106  CO2 21* 23 19*  GLUCOSE 214* 168* 224*  BUN 33* 31* 36*  CREATININE 2.03* 2.06* 2.17*  CALCIUM 9.1 9.5 9.4  MG  --  1.8 1.8  GFRNONAA 36* 36* 34*  ANIONGAP 7 9 9     Recent Labs  Lab 10/21/21 1634 10/23/21 0227  PROT 5.6* 5.3*  ALBUMIN 3.3* 3.0*  AST 18 14*  ALT 23 19  ALKPHOS 102 88  BILITOT 0.8 0.9   Lipids No results for input(s): CHOL, TRIG, HDL, LABVLDL, LDLCALC, CHOLHDL in the last 168 hours.  Hematology Recent Labs  Lab 10/21/21 1634 10/22/21 0411 10/23/21 0227  WBC 8.4 9.6 8.2  RBC 4.38 4.34 4.29  HGB 12.3* 12.5* 12.1*   HCT 38.7* 38.3* 37.5*  MCV 88.4 88.2 87.4  MCH 28.1 28.8 28.2  MCHC 31.8 32.6 32.3  RDW 14.5 14.6 14.6  PLT 376 338 284   Thyroid No results for input(s): TSH, FREET4 in the last 168 hours.  BNP Recent Labs  Lab 10/21/21 1634  BNP 324.0*    DDimer No results for input(s): DDIMER in the last 168 hours.   Radiology/Studies:  CT HEAD WO CONTRAST (5MM)  Result Date: 10/21/2021 CLINICAL DATA:  Headache, dizziness, weakness EXAM: CT HEAD WITHOUT CONTRAST TECHNIQUE: Contiguous axial images were obtained from the base of the skull through the vertex without intravenous contrast. RADIATION DOSE REDUCTION: This exam was performed according to the departmental dose-optimization program which includes automated exposure control, adjustment of the mA and/or kV according to patient size and/or use of iterative reconstruction technique. COMPARISON:  10/07/2021, 10/11/2021 FINDINGS: Brain: Hypodensities within the left frontal parietal periventricular white matter consistent with areas of acute infarct seen on previous MRI. No new infarct or hemorrhage. Lateral ventricles and midline structures are unremarkable. No acute extra-axial fluid collections. No mass effect. Vascular: No hyperdense vessel or unexpected calcification. Stable atherosclerosis. Skull: Normal. Negative for fracture or focal lesion. Sinuses/Orbits: No acute finding. Other: None. IMPRESSION: 1. Subacute small vessel ischemic changes within the left frontal parietal white matter, corresponding to previous MRI findings. 2. No acute infarct or hemorrhage. Electronically Signed   By: Randa Ngo M.D.   On: 10/21/2021 19:11   DG Chest Portable 1 View  Result Date: 10/21/2021 CLINICAL DATA:  61 year old male with history of shortness of breath, dizziness and weakness. EXAM: PORTABLE CHEST 1 VIEW COMPARISON:  Chest x-ray 10/09/2021. FINDINGS: Bibasilar opacities may reflect areas of atelectasis and/or consolidation, with superimposed small  bilateral pleural effusions. No pneumothorax. No evidence of pulmonary edema. Heart size is mildly enlarged. Upper mediastinal contours are within normal limits. Status post median sternotomy for aortic valve replacement with what appears to be a  stented bioprosthesis. IMPRESSION: 1. Bibasilar areas of atelectasis and/or consolidation with superimposed small bilateral pleural effusions. Electronically Signed   By: Vinnie Langton M.D.   On: 10/21/2021 12:09     Assessment and Plan:   # Syncope # Hypertensive Urgency Home medications restarted, however patient remains hypertensive requiring PRNs. Will maximize Coreg and start Imdur today.   - Continue Amlodipine 10mg  daily, Lisinopril 40mg  daily, Hydralazine 100 mg TID - Increased Coreg to 25 mg BID - Start Imdur 30 mg daily  - Continue telemetry monitoring  # Dyspnea No hypoxia noted during visit. Chest x-ray showed bibasilar areas of atelectasis and/or consolidation with superimposed small bilateral pleural effusion. Low suspicion for pneumonia given chronicity of dyspnea. Given history of prolonged hospitalization and recent syncopal episode, there is concern for PE - will obtain V/Q study. Will check echo to rule out aortic valve dysfunction as contributory. If these studies are negative, will plan to check CT chest to rule out interstitial lung disease.   - Continuous pulse oximetry.  - Recommend supplemental oxygen only for O2 saturation below 92%.  - Echo pending - V/Q scan pending  #Demand Ischemia  High-sensitivity troponin 144 >> 154. In the setting of hypertensive urgency.  - No additional ischemic work-up necessary at this time.  #CAD s/p Recent CABG S/p recent CABG with LIMA to LAD on 10/05/2021. Sternotomy incision healing well.  - No aspirin due to need for Coumadin. - Continue beta-blocker and high-intensity statin  #Severe Aortic Stenosis s/p Recent AVR S/p bioprosthetic AVR on 10/05/2021. - On Coumadin given post-op  atrial fibrillation and CVA.  #Post-Op Atrial Fibrillation Following recent CABG/AVR. Converted with Amiodarone. Remains in sinus rhythm at this time.  - Continue Amiodarone 200mg  twice daily.  - Continue Coreg  - Continue Coumadin per pharmacy dosing. INR 1.6 today (goal 2.0-2.5)  #Recent CVA Following CABG/AVR, had acute CVA. MRI showed multiple acute to subacute small infarcts in multiple vascular territories raising the possibility of recent embolic event. Repeat head CT this admission showed subacute small vessel ischemic changes corresponding to recent MR findings but no acute infarct or hemorrhage. - Continue Coumadin as above.  #Hyperlipidemia - Continue home Lipitor 80mg  daily.  #Type 2 Diabetes Mellitus - Management per primary team.  #CKD Stage IIIb #S/p Renal Transplant Patient has a history of a nephrectomy at age 89 for congential nonfunctional left kidney and subsequent ESRD previously on dialysis and then renal transplant in 2018. Baseline creatinine around 2.0 to 2.3. Creatinine with baseline at this time.  - On Cellcept, Belatacept, and Prednisone. - [facility]ed at [facility] as an outpatient.   #Obstructive Sleep Apnea - Continue BiPAP at night.   # Anxiety Disorder Patient endorses worsening anxiety with possible anxiety attacks, as recent as this AM. He is on Xanax TID PRN. Would recommend switching to a longer acting benzo for short term relief and adding an SSRI for long term relief. Will message primary team regarding these recommendations.   Risk Assessment/Risk Scores:   CHA2DS2-VASc Score = 5  This indicates a 7.2% annual risk of stroke. The patient's score is based upon: CHF History: 0 HTN History: 1 Diabetes History: 1 Stroke History: 2 Vascular Disease History: 1 Age Score: 0 Gender Score: 0  For questions or updates, please contact [facility] Please consult [email or web address] for contact info under    Signed, Dr. Jose Persia Internal Medicine  PGY-3  10/24/2021, 10:37 AM  Patient seen and examined and agree with with Jose Persia, MD as  detailed above.  In brief, the patient is a 61 y.o. male with a history of CAD s/p recent CABG x1 (LIMA to LAD) on 10/05/2021, severe aortic stenosis s/p recent AVR on 10/05/2021, paroxysmal SVT, hypertension, hyperlipidemia, type 2 diabetes mellitus, s/p nephrectomy at age 41 for congential nonfunctional left kidney and subsequent ESRD previously on dialysis and then renal transplant in 2018, obstructive sleep apnea, and obesity who presented to the ER with dizziness and weakness found to have severe HTN for which Cardiology has been consulted.   Patient had recent CABG AVR on 10/05/21 with post-op course complicated by Afib requiring amiodarone, AKI on CKD, and perioperative CVA with multiple embolic lesions on MRI. He was initiated on warfarin and discharged to SNF. At rehab, he has been having episodes of dizziness/lightheadedness where his BP is markedly elevated to the 200s. Has had a history of HTN prior to surgery but has never run this high. He denies chest pain but has been having ongoing short windedness. Recent INRs subtherapeutic.   Today, blood pressures remain elevated. He remains dyspneic today but is not hypoxic or tachycardic. Very anxious on exam this morning. Was having chest pain in this setting that resolved with xanax. ECG with NSR, LVH with strain. TTE on my review with normal EF, normal functioning aortic valve prosthesis, normal RV. V/Q scan pending.     GEN: Anxious, appears dyspneic Neck: JVD difficult to assess due to body habitus Cardiac: RRR, 2/6 systolic murmur Respiratory: Diminished at the bases but otherwise clear GI: Obese, soft, ND MS: Minimal edema; warm Neuro:  Nonfocal  Psych: anxious   Plan: -TTE reassuring on my review with normal EF, normal RV, normal functioning aortic valve prosthesis -V/Q pending -Continue amlodipine 10mg  daily -Continue lisinopril 40mg   daily -Increase coreg to 25mg  BID -Continue hydralazine to 100mg  TID -Add imdur 30mg  daily -If BP fails to improve, will check renal doppler -Warfarin per pharmacy -Continue amiodarone 200mg  BID -S/p renal transplant; on immunosuppresion -Management of anxiety per primary   Gwyndolyn Kaufman, MD

## 2021-10-24 NOTE — Progress Notes (Signed)
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  The patient had reported that he was having severe episodes of hypotension during his recent hospitalization and would experience dizzy sensation, nausea and mild headaches with some of these episodes.  His blood pressure medications were adjusted during the hospitalization but he continued these at the rehab facility initiated clonidine during 1 recent episodes but otherwise reports that he has not been any changes to his medications at discharge.  In particular episode yesterday morning and was sent to the ED and not taking any of his antihypertensives.  His chronic leg swelling but is currently getting better.  Upon arrival to ED he is found to have a blood pressure as high of 204/119.  EKG showed sinus rhythm with LVH and repolarization abnormalities.  Chest x-ray showed bibasilar atelectasis and consolidation with small bilateral pleural effusions.  Head CT was notable for a subacute small vessel ischemic changes consistent with prior MRI but without any acute findings.  The EDP discussed with cardiology and the patient was given IV labetalol and acetaminophen.  Currently his blood pressure remains elevated and INR remains subtherapeutic but it is improving.  Because of his uncontrolled hypertension not improving with medication adjustments cardiology been consulted for further evaluation  recommendations  Assessment & Plan:   Principal Problem:   Hypertensive urgency Active Problems:   Diabetes mellitus type 2 in obese (HCC)   Severe obstructive sleep apnea   Hypothyroidism   Stage 3b chronic kidney disease (CKD) (HCC)   Immunosuppressed status (HCC)   Aortic valve stenosis   S/P AVR (aortic valve replacement)   Cerebrovascular accident (CVA) due to embolism of precerebral artery ([place])  Hypertensive urgency  - Presents with intermittent dizziness and nausea that patient attributes to severely elevated BPs and found to have BP as high as 204/119 in ED; blood pressures have been uncontrolled and has been as high as 228/111 and he has been having dizziness and headaches  - No evidence for end-organ damage; improved with IV labetalol x1 in ED and will resume labetalol and IV hydralazine -Continue monitor blood pressures per protocol because his blood pressure remain elevated still cardiology was consulted and they are going to maximize his Coreg as he was switched from metoprolol and start Imdur today -Last blood pressure reading was 147/57 -Per cardiology if blood pressure continues to fail to improve then we will check renal Doppler -Medications have been adjusted by cardiology and they are recommending continuing amlodipine 10 mg daily, lisinopril 40 mils p.o. daily and have switched metoprolol to Coreg and increase to 25 mg p.o. twice daily and added hydralazine 100 mg p.o. 3 times daily and today they will be adding Imdur 30 mg p.o. daily   CAD s/p CABG; AS s/p AVR  - S/p LIMA to LAD and AVR on 10/05/21  - Mild flat elevation in troponin and elevated BNP in ED without angina or overt CHF; ED discussed with cardiology and enzyme elevations attributed to recent  surgery   -Continue to monitor and trend and follow-up -He was complaining of some chest pain this morning but likely related to anxiety -Cardiology recommends no ischemic work-up at this time but has ordered a TTE  which was reviewed by Dr. Johney Frame and showed a normal EF and normal right ventricular function as well as a normal aortic valve prosthesis  Dyspnea -Currently not hypoxic but chest x-ray did show bibasilar atelectasis or consolidation with superimposed small bilateral effusions -His dyspnea is chronic but given his prolonged hospitalization and recent syncopal episode with concern for PE so he will be getting a VQ scan which showed no evidence of pulm embolus but did show bilateral pleural effusions -Cardiology is ordering an echo to evaluate any aortic valve dysfunction as contributory -If these tests are negative plan is to get a CT of the chest to rule out interstitial lung disease -We will defer to cardiology to initiate diuresis   Hx of recent CVA  - Suffered embolic CVA during recent admission in setting of post-operative atrial fibrillation and was seen by neurology who recommended warfarin and increasing statin intensity   - Continue warfarin, Lipitor 80 mg  -We will likely bridge with a Lovenox bridge given his subtherapeutic INR -CT head this admission showed small subacute vessel ischemic changes corresponding to recent MRI findings but no acute infarct or hemorrhage  Atrial fibrillation  - Started on warfarin for post-operative a fib with embolic stroke during recent admission - Continue warfarin with pharmacy assistance, continue metoprolol   -Patient's last INR was 1.6   CKD IIIb; renal transplant recipient  Metabolic Acidosis - SCr is 2.04 on admission, improved from recent admission; BUN/creatinine is now 31/2.06 and is now slightly worsened to 36/2.17 yesterday but today it is 34/2.04 -Patient's CO2 is now 20, anion gap is 8, chloride level is 105 -Renally-dose medications, continue Cellcept and prednisone 5 mg qD  -Avoid further nephrotoxic medications, contrast dyes, hypotension renally dose medications -Repeat CMP in a.m.   Insulin-dependent DM  - A1c was 7.1% in  Dec 2022  -Continue CBG checks and Sensitive Novolog SSI AC/HS -Added 2 units TIDwm -CBG's ranging from 207-287   OSA  -Continue BiPAP qHS     Hypothyroidism  -Continue Levothyroxine 100 mcg po Daily    Anxiety with panic attacks -C/w Alprazolam 0.25 mg po TIDprn Anxiety and will add scheduled clonazepam 0.125 mg twice daily -Continue with his SNRI with duloxetine   DVT prophylaxis: Anticoagulated with Coumadin and will initiate Lovenox bridge Code Status: FULL CODE Family Communication: No family currently present at bedside Disposition Plan: Anticipating discharging to SNF  Status is: Inpatient   The patient will require care spanning > 2 midnights and should be moved to inpatient because: His BP needs to improve and will need an INR Closer to becoming Therapeutic    Consultants:  Cardiology  Procedures: None  Antimicrobials:  Anti-infectives (From admission, onward)    None        Subjective: Seen and examined at bedside and he had a rough morning and became short of breath and had some chest pain and was very anxious.  Blood pressure was still elevated.  He is undergoing a VQ scan and echo.  Denies any lightheadedness or dizziness.  Had a headache.  No other concerns or complaints at this time.  Objective: Vitals:   10/24/21 0532 10/24/21 0836 10/24/21 1336 10/24/21 1718  BP: (!) 174/82 (!) 159/80 (!) 150/69 (!) 147/57  Pulse: 68 77 71  70  Resp: 20  20   Temp: (!) 96.4 F (35.8 C)     TempSrc: Oral     SpO2: 99%  96%   Weight:      Height:        Intake/Output Summary (Last 24 hours) at 10/24/2021 1847 Last data filed at 10/24/2021 1550 Gross per 24 hour  Intake 943 ml  Output 550 ml  Net 393 ml    Filed Weights   10/21/21 1036  Weight: 122.5 kg   Examination: Physical Exam:  Constitutional: WN/WD obese Caucasian male currently in no acute distress appears slightly uncomfortable laying in bed  Eyes: Lids and conjunctivae normal, sclerae  anicteric  ENMT: External Ears, Nose appear normal. Grossly normal hearing. Mucous membranes are moist.  Neck: Appears normal, supple, no cervical masses, normal ROM, no appreciable thyromegaly; no appreciable JVD Respiratory: Slightly diminished to auscultation bilaterally, no wheezing, rales, rhonchi or crackles. Normal respiratory effort and patient is not tachypenic. No accessory muscle use.  Unlabored breathing and no longer wearing CPAP Cardiovascular: RRR, has a slight murmur. S1 and S2 auscultated.  Mild lower extremity edema Abdomen: Soft, non-tender, distended secondary body habitus.  Bowel sounds positive.  GU: Deferred. Musculoskeletal: No clubbing / cyanosis of digits/nails. No joint deformity upper and lower extremities Skin: No rashes, lesions, ulcers on limited skin evaluation. No induration; Warm and dry.  Neurologic: CN 2-12 grossly intact with no focal deficits. Romberg sign and cerebellar reflexes not assessed.  Psychiatric: Normal judgment and insight. Alert and oriented x 3. Normal mood and appropriate affect.   Data Reviewed: I have personally reviewed following labs and imaging studies  CBC: Recent Labs  Lab 10/18/21 0643 10/21/21 1634 10/22/21 0411 10/23/21 0227 10/24/21 0305  WBC 9.1 8.4 9.6 8.2 6.6  NEUTROABS  --  6.7  --  6.4 4.9  HGB 12.4* 12.3* 12.5* 12.1* 11.9*  HCT 40.1 38.7* 38.3* 37.5* 36.9*  MCV 90.7 88.4 88.2 87.4 87.4  PLT 408* 376 338 284 712    Basic Metabolic Panel: Recent Labs  Lab 10/21/21 1634 10/22/21 0411 10/23/21 0227 10/24/21 0305 10/24/21 0811  NA 133* 137 134* 135 133*  K 3.9 4.2 4.1 4.2 4.2  CL 105 105 106 106 105  CO2 21* 23 19* 20* 20*  GLUCOSE 214* 168* 224* 174* 175*  BUN 33* 31* 36* 37* 34*  CREATININE 2.03* 2.06* 2.17* 2.12* 2.04*  CALCIUM 9.1 9.5 9.4 9.6 9.7  MG  --  1.8 1.8 1.8  --   PHOS  --   --  3.1 3.2  --     GFR: Estimated Creatinine Clearance: 50 mL/min (A) (by C-G formula based on SCr of 2.04 mg/dL  (H)). Liver Function Tests: Recent Labs  Lab 10/21/21 1634 10/23/21 0227 10/24/21 0305  AST 18 14* 13*  ALT 23 19 17   ALKPHOS 102 88 90  BILITOT 0.8 0.9 0.8  PROT 5.6* 5.3* 5.2*  ALBUMIN 3.3* 3.0* 3.1*    No results for input(s): LIPASE, AMYLASE in the last 168 hours. No results for input(s): AMMONIA in the last 168 hours. Coagulation Profile: Recent Labs  Lab 10/18/21 0643 10/21/21 1634 10/22/21 0411 10/23/21 0227 10/24/21 0811  INR 1.9* 1.3* 1.3* 1.5* 1.6*    Cardiac Enzymes: No results for input(s): CKTOTAL, CKMB, CKMBINDEX, TROPONINI in the last 168 hours. BNP (last 3 results) No results for input(s): PROBNP in the last 8760 hours. HbA1C: No results for input(s): HGBA1C in the last 72 hours. CBG:  Recent Labs  Lab 10/23/21 1555 10/23/21 2141 10/24/21 0747 10/24/21 1147 10/24/21 1552  GLUCAP 244* 216* 184* 207* 177*    Lipid Profile: No results for input(s): CHOL, HDL, LDLCALC, TRIG, CHOLHDL, LDLDIRECT in the last 72 hours. Thyroid Function Tests: No results for input(s): TSH, T4TOTAL, FREET4, T3FREE, THYROIDAB in the last 72 hours. Anemia Panel: No results for input(s): VITAMINB12, FOLATE, FERRITIN, TIBC, IRON, RETICCTPCT in the last 72 hours. Sepsis Labs: No results for input(s): PROCALCITON, LATICACIDVEN in the last 168 hours.  Recent Results (from the past 240 hour(s))  Resp Panel by RT-PCR (Flu A&B, Covid) Nasopharyngeal Swab     Status: None   Collection Time: 10/18/21 12:00 PM   Specimen: Nasopharyngeal Swab; Nasopharyngeal(NP) swabs in vial transport medium  Result Value Ref Range Status   SARS Coronavirus 2 by RT PCR NEGATIVE NEGATIVE Final    Comment: (NOTE) SARS-CoV-2 target nucleic acids are NOT DETECTED.  The SARS-CoV-2 RNA is generally detectable in upper respiratory specimens during the acute phase of infection. The lowest concentration of SARS-CoV-2 viral copies this assay can detect is 138 copies/mL. A negative result does not  preclude SARS-Cov-2 infection and should not be used as the sole basis for treatment or other patient management decisions. A negative result may occur with  improper specimen collection/handling, submission of specimen other than nasopharyngeal swab, presence of viral mutation(s) within the areas targeted by this assay, and inadequate number of viral copies(<138 copies/mL). A negative result must be combined with clinical observations, patient history, and epidemiological information. The expected result is Negative.  Fact Sheet for Patients:  EntrepreneurPulse.com.au  Fact Sheet for Healthcare Providers:  IncredibleEmployment.be  This test is no t yet approved or cleared by the [place] FDA and  has been authorized for detection and/or diagnosis of SARS-CoV-2 by FDA under an Emergency Use Authorization (EUA). This EUA will remain  in effect (meaning this test can be used) for the duration of the COVID-19 declaration under Section 564(b)(1) of the Act, 21 U.S.C.section 360bbb-3(b)(1), unless the authorization is terminated  or revoked sooner.       Influenza A by PCR NEGATIVE NEGATIVE Final   Influenza B by PCR NEGATIVE NEGATIVE Final    Comment: (NOTE) The Xpert Xpress SARS-CoV-2/FLU/RSV plus assay is intended as an aid in the diagnosis of influenza from Nasopharyngeal swab specimens and should not be used as a sole basis for treatment. Nasal washings and aspirates are unacceptable for Xpert Xpress SARS-CoV-2/FLU/RSV testing.  Fact Sheet for Patients: EntrepreneurPulse.com.au  Fact Sheet for Healthcare Providers: IncredibleEmployment.be  This test is not yet approved or cleared by the [place] FDA and has been authorized for detection and/or diagnosis of SARS-CoV-2 by FDA under an Emergency Use Authorization (EUA). This EUA will remain in effect (meaning this test can be used) for the  duration of the COVID-19 declaration under Section 564(b)(1) of the Act, 21 U.S.C. section 360bbb-3(b)(1), unless the authorization is terminated or revoked.  Performed at [facility], [place] [address] [place], [place] [address]   Resp Panel by RT-PCR (Flu A&B, Covid) Nasopharyngeal Swab     Status: None   Collection Time: 10/21/21 10:39 PM   Specimen: Nasopharyngeal Swab; Nasopharyngeal(NP) swabs in vial transport medium  Result Value Ref Range Status   SARS Coronavirus 2 by RT PCR NEGATIVE NEGATIVE Final    Comment: (NOTE) SARS-CoV-2 target nucleic acids are NOT DETECTED.  The SARS-CoV-2 RNA is generally detectable in upper respiratory specimens during the acute phase  of infection. The lowest concentration of SARS-CoV-2 viral copies this assay can detect is 138 copies/mL. A negative result does not preclude SARS-Cov-2 infection and should not be used as the sole basis for treatment or other patient management decisions. A negative result may occur with  improper specimen collection/handling, submission of specimen other than nasopharyngeal swab, presence of viral mutation(s) within the areas targeted by this assay, and inadequate number of viral copies(<138 copies/mL). A negative result must be combined with clinical observations, patient history, and epidemiological information. The expected result is Negative.  Fact Sheet for Patients:  EntrepreneurPulse.com.au  Fact Sheet for Healthcare Providers:  IncredibleEmployment.be  This test is no t yet approved or cleared by the [place] FDA and  has been authorized for detection and/or diagnosis of SARS-CoV-2 by FDA under an Emergency Use Authorization (EUA). This EUA will remain  in effect (meaning this test can be used) for the duration of the COVID-19 declaration under Section 564(b)(1) of the Act, 21 U.S.C.section 360bbb-3(b)(1), unless the authorization is terminated  or  revoked sooner.       Influenza A by PCR NEGATIVE NEGATIVE Final   Influenza B by PCR NEGATIVE NEGATIVE Final    Comment: (NOTE) The Xpert Xpress SARS-CoV-2/FLU/RSV plus assay is intended as an aid in the diagnosis of influenza from Nasopharyngeal swab specimens and should not be used as a sole basis for treatment. Nasal washings and aspirates are unacceptable for Xpert Xpress SARS-CoV-2/FLU/RSV testing.  Fact Sheet for Patients: EntrepreneurPulse.com.au  Fact Sheet for Healthcare Providers: IncredibleEmployment.be  This test is not yet approved or cleared by the [place] FDA and has been authorized for detection and/or diagnosis of SARS-CoV-2 by FDA under an Emergency Use Authorization (EUA). This EUA will remain in effect (meaning this test can be used) for the duration of the COVID-19 declaration under Section 564(b)(1) of the Act, 21 U.S.C. section 360bbb-3(b)(1), unless the authorization is terminated or revoked.  Performed at [facility], [place] [address], [place], [place] [address]      RN Pressure Injury Documentation:     Estimated body mass index is 37.66 kg/m as calculated from the following:   Height as of this encounter: 5\' 11"  (1.803 m).   Weight as of this encounter: 122.5 kg.  Malnutrition Type:   Malnutrition Characteristics:   Nutrition Interventions:   Radiology Studies: NM Pulmonary Perfusion  Result Date: 10/24/2021 CLINICAL DATA:  Onset of chest pain this morning, anxiousness, dyspnea, history of CABG 10/05/2021, AVR 10/05/2021, hypertension, type II diabetes mellitus, paroxysmal SVT EXAM: NUCLEAR MEDICINE PERFUSION LUNG SCAN TECHNIQUE: Perfusion images were obtained in multiple projections after intravenous injection of radiopharmaceutical. Ventilation scans intentionally deferred if perfusion scan and chest x-ray adequate for interpretation during COVID 19 epidemic. RADIOPHARMACEUTICALS:  4.1 mCi  Tc-80m MAA IV COMPARISON:  Chest radiograph 10/24/2021 FINDINGS: Flattening of posterior lungs consistent with layered pleural effusions. No segmental or subsegmental perfusion defects. IMPRESSION: BILATERAL pleural effusions. Pulmonary embolism absent. Electronically Signed   By: Lavonia Dana M.D.   On: 10/24/2021 13:47   DG Chest Port 1 View  Result Date: 10/24/2021 CLINICAL DATA:  Bypass surgery. EXAM: PORTABLE CHEST 1 VIEW COMPARISON:  10/21/2021 FINDINGS: The cardiac silhouette, mediastinal and hilar contours are within normal limits and stable. Stable surgical changes bypass surgery. There are persistent bilateral pleural effusions and bibasilar atelectasis but overall slight improved aeration. No pneumothorax. IMPRESSION: Persistent bilateral pleural effusions and bibasilar atelectasis. No edema or pneumothorax. Electronically Signed   By: Mamie Nick.  Gallerani M.D.   On: 10/24/2021 08:03   ECHOCARDIOGRAM LIMITED  Result Date: 10/24/2021    ECHOCARDIOGRAM LIMITED REPORT   Patient Name:   DAIVEON MARKMAN Date of Exam: 10/24/2021 Medical Rec #:  [id number]        Height:       71.0 in Accession #:    [id number]       Weight:       270.0 lb Date of Birth:  November 10, 1960        BSA:          2.395 m Patient Age:    73 years         BP:           159/80 mmHg Patient Gender: M                HR:           76 bpm. Exam Location:  Inpatient Procedure: Limited Echo, Cardiac Doppler and Color Doppler Indications:    Dyspnea  History:        Patient has prior history of Echocardiogram examinations, most                 recent 05/02/2021. Risk Factors:Diabetes and Hypertension.                 Aortic Valve: 27 mm Edwards INSPIRIS RESILIA pericardial valve                 valve is present in the aortic position. Procedure Date:                 10/05/2021.  Sonographer:    Glo Herring Referring Phys: [id number] [place]  1. Left ventricular ejection fraction, by estimation, is 60 to 65%. The left ventricle  has normal function. The left ventricle has no regional wall motion abnormalities. There is moderate concentric left ventricular hypertrophy.  2. Right ventricular systolic function is normal. The right ventricular size is normal.  3. The mitral valve is degenerative.  4. Normal 27 mm Edwards Inspiris Resilia aortic prosthesis. Vmax 2.3 m/s, MG 12 mmHG, EOA 2.14 cm2, DI 0.26. The aortic valve has been repaired/replaced. Aortic valve regurgitation is not visualized. There is a 27 mm Edwards INSPIRIS RESILIA pericardial  valve valve present in the aortic position. Procedure Date: 10/05/2021.  5. The inferior vena cava is dilated in size with >50% respiratory variability, suggesting right atrial pressure of 8 mmHg. Comparison(s): Changes from prior study are noted. AoV now replaced. FINDINGS  Left Ventricle: Left ventricular ejection fraction, by estimation, is 60 to 65%. The left ventricle has normal function. The left ventricle has no regional wall motion abnormalities. The left ventricular internal cavity size was normal in size. There is  moderate concentric left ventricular hypertrophy. Abnormal (paradoxical) septal motion consistent with post-operative status. Right Ventricle: The right ventricular size is normal. No increase in right ventricular wall thickness. Right ventricular systolic function is normal. Left Atrium: Left atrial size was normal in size. Right Atrium: Right atrial size was normal in size. Pericardium: Trivial pericardial effusion is present. Mitral Valve: The mitral valve is degenerative in appearance. Mild to moderate mitral annular calcification. Tricuspid Valve: The tricuspid valve is grossly normal. Tricuspid valve regurgitation is trivial. No evidence of tricuspid stenosis. Aortic Valve: Normal 27 mm Edwards Inspiris Resilia aortic prosthesis. Vmax 2.3 m/s, MG 12 mmHG, EOA 2.14 cm2, DI 0.26. The aortic valve has been repaired/replaced. Aortic valve regurgitation is  not visualized. Aortic  valve mean gradient measures 12.0 mmHg. Aortic valve peak gradient measures 21.7 mmHg. Aortic valve area, by VTI measures 2.14 cm. There is a 27 mm Edwards INSPIRIS RESILIA pericardial valve valve present in the aortic position. Procedure Date: 10/05/2021. Venous: The inferior vena cava is dilated in size with greater than 50% respiratory variability, suggesting right atrial pressure of 8 mmHg. LEFT VENTRICLE PLAX 2D LVIDd:         4.70 cm   Diastology LVIDs:         3.30 cm   LV e' medial:    7.13 cm/s LV PW:         1.25 cm   LV E/e' medial:  17.5 LV IVS:        1.49 cm   LV e' lateral:   6.45 cm/s LVOT diam:     2.10 cm   LV E/e' lateral: 19.4 LV SV:         92 LV SV Index:   38 LVOT Area:     3.46 cm  IVC IVC diam: 2.30 cm LEFT ATRIUM         Index LA diam:    5.30 cm 2.21 cm/m  AORTIC VALVE AV Area (Vmax):    2.14 cm AV Area (Vmean):   2.30 cm AV Area (VTI):     2.14 cm AV Vmax:           233.00 cm/s AV Vmean:          161.000 cm/s AV VTI:            0.428 m AV Peak Grad:      21.7 mmHg AV Mean Grad:      12.0 mmHg LVOT Vmax:         144.00 cm/s LVOT Vmean:        107.000 cm/s LVOT VTI:          0.265 m LVOT/AV VTI ratio: 0.62 MITRAL VALVE MV Area (PHT): 2.61 cm     SHUNTS MV Decel Time: 291 msec     Systemic VTI:  0.26 m MV E velocity: 125.00 cm/s  Systemic Diam: 2.10 cm MV A velocity: 112.00 cm/s MV E/A ratio:  1.12 Eleonore Chiquito MD Electronically signed by Eleonore Chiquito MD Signature Date/Time: 10/24/2021/12:42:32 PM    Final     Scheduled Meds:  amiodarone  200 mg Oral BID   amLODipine  10 mg Oral Daily   atorvastatin  80 mg Oral Daily   carvedilol  25 mg Oral BID WC   DULoxetine  60 mg Oral Daily   enoxaparin (LOVENOX) injection  120 mg Subcutaneous Q12H   gabapentin  200 mg Oral Daily   hydrALAZINE  100 mg Oral TID   insulin aspart  0-5 Units Subcutaneous QHS   insulin aspart  0-9 Units Subcutaneous TID WC   insulin aspart  2 Units Subcutaneous TID WC   isosorbide mononitrate  30 mg  Oral Daily   levothyroxine  100 mcg Oral QAC breakfast   lisinopril  40 mg Oral Daily   mouth rinse  15 mL Mouth Rinse BID   mycophenolate  1,000 mg Oral Q12H   predniSONE  5 mg Oral Q breakfast   sodium chloride flush  3 mL Intravenous Q12H   tamsulosin  0.8 mg Oral QHS   Warfarin - Pharmacist Dosing Inpatient   Does not apply q1600   Continuous Infusions:   LOS: 1 day   Bertram Savin  Alfredia Ferguson, DO [facility] PAGER is on AMION  If 7PM-7AM, please contact night-coverage [email or web address]

## 2021-10-25 ENCOUNTER — Inpatient Hospital Stay (HOSPITAL_COMMUNITY): Payer: 59

## 2021-10-25 DIAGNOSIS — G4733 Obstructive sleep apnea (adult) (pediatric): Secondary | ICD-10-CM

## 2021-10-25 DIAGNOSIS — E669 Obesity, unspecified: Secondary | ICD-10-CM

## 2021-10-25 DIAGNOSIS — E1169 Type 2 diabetes mellitus with other specified complication: Secondary | ICD-10-CM

## 2021-10-25 DIAGNOSIS — D849 Immunodeficiency, unspecified: Secondary | ICD-10-CM

## 2021-10-25 LAB — CBC WITH DIFFERENTIAL/PLATELET
Abs Immature Granulocytes: 0.04 10*3/uL (ref 0.00–0.07)
Basophils Absolute: 0 10*3/uL (ref 0.0–0.1)
Basophils Relative: 1 %
Eosinophils Absolute: 0.2 10*3/uL (ref 0.0–0.5)
Eosinophils Relative: 3 %
HCT: 36.6 % — ABNORMAL LOW (ref 39.0–52.0)
Hemoglobin: 11.7 g/dL — ABNORMAL LOW (ref 13.0–17.0)
Immature Granulocytes: 1 %
Lymphocytes Relative: 12 %
Lymphs Abs: 0.8 10*3/uL (ref 0.7–4.0)
MCH: 28.5 pg (ref 26.0–34.0)
MCHC: 32 g/dL (ref 30.0–36.0)
MCV: 89.1 fL (ref 80.0–100.0)
Monocytes Absolute: 0.6 10*3/uL (ref 0.1–1.0)
Monocytes Relative: 9 %
Neutro Abs: 5.1 10*3/uL (ref 1.7–7.7)
Neutrophils Relative %: 74 %
Platelets: 262 10*3/uL (ref 150–400)
RBC: 4.11 MIL/uL — ABNORMAL LOW (ref 4.22–5.81)
RDW: 14.9 % (ref 11.5–15.5)
WBC: 6.8 10*3/uL (ref 4.0–10.5)
nRBC: 0 % (ref 0.0–0.2)

## 2021-10-25 LAB — COMPREHENSIVE METABOLIC PANEL
ALT: 17 U/L (ref 0–44)
AST: 11 U/L — ABNORMAL LOW (ref 15–41)
Albumin: 3.1 g/dL — ABNORMAL LOW (ref 3.5–5.0)
Alkaline Phosphatase: 85 U/L (ref 38–126)
Anion gap: 9 (ref 5–15)
BUN: 40 mg/dL — ABNORMAL HIGH (ref 8–23)
CO2: 20 mmol/L — ABNORMAL LOW (ref 22–32)
Calcium: 9.5 mg/dL (ref 8.9–10.3)
Chloride: 106 mmol/L (ref 98–111)
Creatinine, Ser: 2.46 mg/dL — ABNORMAL HIGH (ref 0.61–1.24)
GFR, Estimated: 29 mL/min — ABNORMAL LOW (ref >60)
Glucose, Bld: 172 mg/dL — ABNORMAL HIGH (ref 70–99)
Potassium: 4.6 mmol/L (ref 3.5–5.1)
Sodium: 135 mmol/L (ref 135–145)
Total Bilirubin: 0.6 mg/dL (ref 0.3–1.2)
Total Protein: 5.1 g/dL — ABNORMAL LOW (ref 6.5–8.1)

## 2021-10-25 LAB — RESP PANEL BY RT-PCR (FLU A&B, COVID) ARPGX2
Influenza A by PCR: NEGATIVE
Influenza B by PCR: NEGATIVE
SARS Coronavirus 2 by RT PCR: NEGATIVE

## 2021-10-25 LAB — GLUCOSE, CAPILLARY
Glucose-Capillary: 163 mg/dL — ABNORMAL HIGH (ref 70–99)
Glucose-Capillary: 174 mg/dL — ABNORMAL HIGH (ref 70–99)
Glucose-Capillary: 198 mg/dL — ABNORMAL HIGH (ref 70–99)
Glucose-Capillary: 192 mg/dL — ABNORMAL HIGH (ref 70–99)
Glucose-Capillary: 211 mg/dL — ABNORMAL HIGH (ref 70–99)

## 2021-10-25 LAB — MAGNESIUM: Magnesium: 1.9 mg/dL (ref 1.7–2.4)

## 2021-10-25 LAB — PROTIME-INR
INR: 1.8 — ABNORMAL HIGH (ref 0.8–1.2)
Prothrombin Time: 21.1 seconds — ABNORMAL HIGH (ref 11.4–15.2)

## 2021-10-25 LAB — PHOSPHORUS: Phosphorus: 3.5 mg/dL (ref 2.5–4.6)

## 2021-10-25 MED ORDER — ACETAMINOPHEN 325 MG PO TABS
325.0000 mg | ORAL_TABLET | ORAL | Status: DC | PRN
  Administered 2021-10-25: 325 mg via ORAL
  Filled 2021-10-25 (×3): qty 1

## 2021-10-25 MED ORDER — ACETAMINOPHEN 325 MG PO TABS: 650.0000 mg | ORAL_TABLET | ORAL | Status: DC | PRN

## 2021-10-25 MED ORDER — ISOSORBIDE MONONITRATE ER 60 MG PO TB24: 90.0000 mg | ORAL_TABLET | Freq: Every day | ORAL | Status: DC

## 2021-10-25 MED ORDER — ACETAMINOPHEN 650 MG RE SUPP: 650.0000 mg | RECTAL | Status: DC | PRN

## 2021-10-25 MED ORDER — ISOSORBIDE MONONITRATE ER 60 MG PO TB24
60.0000 mg | ORAL_TABLET | Freq: Once | ORAL | Status: AC
  Administered 2021-10-25: 60 mg via ORAL
  Filled 2021-10-25: qty 1

## 2021-10-25 MED ORDER — WARFARIN SODIUM 7.5 MG PO TABS
7.5000 mg | ORAL_TABLET | Freq: Once | ORAL | Status: AC
  Administered 2021-10-25: 7.5 mg via ORAL
  Filled 2021-10-25: qty 1

## 2021-10-25 NOTE — TOC Progression Note (Addendum)
Transition of Care [facility]) - Progression Note    Patient Details  Name: Robert [name] MRN: [id number] Date of Birth: 09/13/1961  Transition of Care [facility]) CM/SW [place], [place] Phone Number: 10/25/2021, 1:06 PM  Clinical Narrative:     Patient not managed by [employer]. Juliann Pulse with [facility] confirmed they will start insurance authorization for patient. CSW will continue to follow and assist with patients dc planning needs.  CSW spoke with Juliann Pulse from [facility] who confirmed patient can return back to SNF tomorrow if medically ready and pending insurance approval. CSW awaiting current PT note to start insurance authorization for patient. CSW will continue to follow and assist with patients dc planing needs.    Barriers to Discharge: Continued Medical Work up, Orthoptist and Services                                                 Social Determinants of Health (SDOH) Interventions    Readmission Risk Interventions No flowsheet data found.

## 2021-10-25 NOTE — Progress Notes (Addendum)
Cardiology Consultation:   Patient ID: Robert [name] MRN: [id number]; DOB: 02-22-1961  Admit date: 10/21/2021 Date of Consult: 10/23/2021  PCP:  Haywood Pao, MD   [facility] Providers Cardiologist:  Shelva Majestic, MD   Patient Profile:   Robert [name] is a 61 y.o. male with a history of CAD s/p recent CABG x1 (LIMA to LAD) on 10/05/2021, severe aortic stenosis s/p recent AVR on 10/05/2021, paroxysmal SVT, hypertension, hyperlipidemia, type 2 diabetes mellitus, s/p nephrectomy at age 1 for congential nonfunctional left kidney and subsequent ESRD previously on dialysis and then renal transplant in 2018, obstructive sleep apnea, and obesity who is being seen for the evaluation of hypertension at the request of Dr. Alfredia Ferguson.  History of Present Illness:   Mr. Glymph states he is feeling good at this time. His anxiety is significantly improved. He denies any additional chest pain episodes. His breathing seems improved overall but he did experience some dyspnea that resolved with wearing his BiPAP. He feels a headache is coming on again and is requesting dosage change in Tylenol.  Mr. Carvalho also states his urine output has not changed but he has noticed darkening of his urine. He has not been drinking much water.   Overnight, patient noted he had a headache that improved with Tylenol. No other acute overnight events. Blood pressure improved this AM to 156/72.   Inpatient Medications: Scheduled Meds:  amiodarone  200 mg Oral BID   amLODipine  10 mg Oral Daily   atorvastatin  80 mg Oral Daily   DULoxetine  60 mg Oral Daily   enoxaparin (LOVENOX) injection  120 mg Subcutaneous Q12H   gabapentin  200 mg Oral Daily   hydrALAZINE  50 mg Oral TID   insulin aspart  0-5 Units Subcutaneous QHS   insulin aspart  0-9 Units Subcutaneous TID WC   insulin aspart  2 Units Subcutaneous TID WC   levothyroxine  100 mcg Oral QAC breakfast   lisinopril  40 mg Oral Daily   mouth rinse  15 mL  Mouth Rinse BID   metoprolol tartrate  50 mg Oral BID   mycophenolate  1,000 mg Oral Q12H   predniSONE  5 mg Oral Q breakfast   sodium chloride flush  3 mL Intravenous Q12H   tamsulosin  0.8 mg Oral QHS   warfarin  5 mg Oral ONCE-1600   Warfarin - Pharmacist Dosing Inpatient   Does not apply q1600   Continuous Infusions:  PRN Meds: acetaminophen **OR** acetaminophen, ALPRAZolam, hydrALAZINE, labetalol, ondansetron **OR** ondansetron (ZOFRAN) IV, senna-docusate  Allergies:   No Known Allergies  Social History:   Social History   Socioeconomic History   Marital status: Married    Spouse name: Not on file   Number of children: 3   Years of education: Not on file   Highest education level: Not on file  Occupational History   Occupation: Training and development officer: [employer]  Tobacco Use   Smoking status: Former    Types: Cigars    Quit date: 12/06/2012    Years since quitting: 8.8   Smokeless tobacco: Never  Vaping Use   Vaping Use: Never used  Substance and Sexual Activity   Alcohol use: Yes    Comment: once/month   Drug use: No   Sexual activity: Not on file  Other Topics Concern   Not on file  Social History Narrative   Not on file   Social Determinants of Radio broadcast assistant  Strain: Not on file  Food Insecurity: Not on file  Transportation Needs: Not on file  Physical Activity: Not on file  Stress: Not on file  Social Connections: Not on file  Intimate Partner Violence: Not on file    Family History:   Family History  Problem Relation Age of Onset   Colon polyps Father    Colon cancer Neg Hx    Stomach cancer Neg Hx      ROS:  Negative except as noted above.   Physical Exam/Data:   Vitals:   10/23/21 1044 10/23/21 1216 10/23/21 1243 10/23/21 1355  BP: (!) 168/78 (!) 173/85 (!) 196/89   Pulse: 67 65 67   Resp: 18 18 20 20   Temp:  (!) 97.3 F (36.3 C)    TempSrc:  Oral    SpO2:      Weight:      Height:        Intake/Output Summary (Last 24  hours) at 10/23/2021 1430 Last data filed at 10/23/2021 0507 Gross per 24 hour  Intake 480 ml  Output 450 ml  Net 30 ml   Last 3 Weights 10/21/2021 10/18/2021 10/17/2021  Weight (lbs) 270 lb 287 lb 11.2 oz 292 lb 1.8 oz  Weight (kg) 122.471 kg 130.5 kg 132.5 kg     Body mass index is 37.66 kg/m.  General: 61 y.o. morbidly obese Caucasian male resting comfortably in no acute distress. HEENT: Normocephalic and atraumatic. Sclera clear.  Neck: Supple. No JVD.  Heart: RRR. Normal S1 and S2. Holosystolic murmur  Lungs: Minimal tachypnea noted, however no accessory muscle use. Saturating >95% on room air. Fine crackles in the bibasilar and left middle lung fields. No wheezing or rhonchi.  Abdomen: Soft, non-distended, and non-tender to palpation.  MSK: Normal strength and tone for age. Extremities: Trace pitting edema of the bilateral lower extremities.    Skin: Warm and dry. Neuro: Alert and oriented x3. No focal deficits. Psych: Normal affect. Responds appropriately.  EKG: No new ECGs  Telemetry:  Telemetry was personally reviewed and demonstrates: Sinus rhythm with no PVCs. Rates in the 80s.   Relevant CV Studies:  Echocardiogram 10/24/2021: Impressions: 1. Left ventricular ejection fraction, by estimation, is 60 to 65%. The  left ventricle has normal function. The left ventricle has no regional  wall motion abnormalities. There is moderate concentric left ventricular  hypertrophy.   2. Right ventricular systolic function is normal. The right ventricular  size is normal.   3. The mitral valve is degenerative.   4. Normal 27 mm Edwards Inspiris Resilia aortic prosthesis. Vmax 2.3 m/s,  MG 12 mmHG, EOA 2.14 cm2, DI 0.26. The aortic valve has been  repaired/replaced. Aortic valve regurgitation is not visualized. There is  a 27 mm Edwards INSPIRIS RESILIA pericardial   valve valve present in the aortic position. Procedure Date: 10/05/2021.   5. The inferior vena cava is dilated in  size with >50% respiratory  variability, suggesting right atrial pressure of 8 mmHg.   Comparison(s): Changes from prior study are noted. AoV now replaced.  _______________  Right/Left Cardiac Catheterization 08/22/2021:   2nd Mrg lesion is 50% stenosed.   Lat 2nd Mrg lesion is 20% stenosed.   RPAV lesion is 80% stenosed.   Dist LM to Prox LAD lesion is 80% stenosed.   Prox LAD to Mid LAD lesion is 90% stenosed.   LV end diastolic pressure is mildly elevated.  Diagnostic Dominance: Right   Laboratory Data:  High Sensitivity Troponin:  Recent Labs  Lab 10/21/21 1634 10/21/21 1811  TROPONINIHS 144* 154*     Chemistry Recent Labs  Lab 10/21/21 1634 10/22/21 0411 10/23/21 0227  NA 133* 137 134*  K 3.9 4.2 4.1  CL 105 105 106  CO2 21* 23 19*  GLUCOSE 214* 168* 224*  BUN 33* 31* 36*  CREATININE 2.03* 2.06* 2.17*  CALCIUM 9.1 9.5 9.4  MG  --  1.8 1.8  GFRNONAA 36* 36* 34*  ANIONGAP 7 9 9     Recent Labs  Lab 10/21/21 1634 10/23/21 0227  PROT 5.6* 5.3*  ALBUMIN 3.3* 3.0*  AST 18 14*  ALT 23 19  ALKPHOS 102 88  BILITOT 0.8 0.9   Lipids No results for input(s): CHOL, TRIG, HDL, LABVLDL, LDLCALC, CHOLHDL in the last 168 hours.  Hematology Recent Labs  Lab 10/21/21 1634 10/22/21 0411 10/23/21 0227  WBC 8.4 9.6 8.2  RBC 4.38 4.34 4.29  HGB 12.3* 12.5* 12.1*  HCT 38.7* 38.3* 37.5*  MCV 88.4 88.2 87.4  MCH 28.1 28.8 28.2  MCHC 31.8 32.6 32.3  RDW 14.5 14.6 14.6  PLT 376 338 284   Thyroid No results for input(s): TSH, FREET4 in the last 168 hours.  BNP Recent Labs  Lab 10/21/21 1634  BNP 324.0*    DDimer No results for input(s): DDIMER in the last 168 hours.  Radiology/Studies:  CT HEAD WO CONTRAST (5MM)  Result Date: 10/21/2021 CLINICAL DATA:  Headache, dizziness, weakness EXAM: CT HEAD WITHOUT CONTRAST TECHNIQUE: Contiguous axial images were obtained from the base of the skull through the vertex without intravenous contrast. RADIATION DOSE  REDUCTION: This exam was performed according to the departmental dose-optimization program which includes automated exposure control, adjustment of the mA and/or kV according to patient size and/or use of iterative reconstruction technique. COMPARISON:  10/07/2021, 10/11/2021 FINDINGS: Brain: Hypodensities within the left frontal parietal periventricular white matter consistent with areas of acute infarct seen on previous MRI. No new infarct or hemorrhage. Lateral ventricles and midline structures are unremarkable. No acute extra-axial fluid collections. No mass effect. Vascular: No hyperdense vessel or unexpected calcification. Stable atherosclerosis. Skull: Normal. Negative for fracture or focal lesion. Sinuses/Orbits: No acute finding. Other: None. IMPRESSION: 1. Subacute small vessel ischemic changes within the left frontal parietal white matter, corresponding to previous MRI findings. 2. No acute infarct or hemorrhage. Electronically Signed   By: Randa Ngo M.D.   On: 10/21/2021 19:11   DG Chest Portable 1 View  Result Date: 10/21/2021 CLINICAL DATA:  61 year old male with history of shortness of breath, dizziness and weakness. EXAM: PORTABLE CHEST 1 VIEW COMPARISON:  Chest x-ray 10/09/2021. FINDINGS: Bibasilar opacities may reflect areas of atelectasis and/or consolidation, with superimposed small bilateral pleural effusions. No pneumothorax. No evidence of pulmonary edema. Heart size is mildly enlarged. Upper mediastinal contours are within normal limits. Status post median sternotomy for aortic valve replacement with what appears to be a stented bioprosthesis. IMPRESSION: 1. Bibasilar areas of atelectasis and/or consolidation with superimposed small bilateral pleural effusions. Electronically Signed   By: Vinnie Langton M.D.   On: 10/21/2021 12:09    Assessment and Plan:   #Syncope #Hypertensive Urgency Blood pressure has improved since increasing Coreg and starting Imdur yesterday. Remains  above goal, so will require further titration.   - Continue Amlodipine 10mg  daily, Lisinopril 40mg  daily, Hydralazine 100 mg TID, Coreg 25 mg BID - Increase Imdur 30 mg to 90 mg daily - Continue telemetry monitoring  #Dyspnea No hypoxia noted during visit. Chest  x-ray showed bibasilar areas of atelectasis and/or consolidation with superimposed small bilateral pleural effusion. Low suspicion for pneumonia given chronicity of dyspnea. TTE obtained yesterday with no evidence of aortic valve dysfunction. V/Q scan negative for PE. Dyspnea significantly improved today that patient's anxiety is under better control - question if it was predominantly anxiety-related.   - Continuous pulse oximetry.  - Recommend supplemental oxygen only for O2 saturation below 92% - No diuresis planned at this time given creatinine increase and improvement in respiratory status.   #Demand Ischemia  High-sensitivity troponin 144 >> 154. TTE stable with no RWMA. In the setting of hypertensive urgency.  - No additional ischemic work-up necessary at this time.  #CAD s/p Recent CABG S/p recent CABG with LIMA to LAD on 10/05/2021. Sternotomy incision healing well.  - No aspirin due to need for Coumadin. - Continue beta-blocker and high-intensity statin  #Severe Aortic Stenosis s/p Recent AVR S/p bioprosthetic AVR on 10/05/2021. - On Coumadin given post-op atrial fibrillation and CVA.  #Post-Op Atrial Fibrillation Following recent CABG/AVR. Converted with Amiodarone. Remains in sinus rhythm at this time.  - Continue Amiodarone 200mg  twice daily.  - Continue Coreg 25 mg BID - Continue Coumadin per pharmacy dosing.   #CKD Stage IIIb #S/p Renal Transplant Patient has a history of a nephrectomy at age 65 for congential nonfunctional left kidney and subsequent ESRD previously on dialysis and then renal transplant in 2018. Baseline creatinine around 2.0 to 2.3. Unfortunately creatinine increased overnight from 2.04 to  2.46 with no clear trigger. He is on Lisinopril but has been chronically, so low suspicion this is ACEi induced. Given difficult to control BP, would be high risk to hold. He has not received any diuretics or other nephrotoxic agents. After discussion with patient, this may be pre-renal in the setting of poor PO intake.   - Encouraged increased PO water intake today - Recheck BMP in the AM - On Cellcept, Belatacept, and Prednisone. - Followed at [facility] as an outpatient.   #Obstructive Sleep Apnea - Continue BiPAP at night.   # Anxiety Disorder - Management per primary   #Embolic CVA - Continue Coumadin as above.  #Hyperlipidemia - Continue home Lipitor 80mg  daily.  #Type 2 Diabetes Mellitus - Management per primary team.  Risk Assessment/Risk Scores:   CHA2DS2-VASc Score = 5  This indicates a 7.2% annual risk of stroke. The patient's score is based upon: CHF History: 0 HTN History: 1 Diabetes History: 1 Stroke History: 2 Vascular Disease History: 1 Age Score: 0 Gender Score: 0  For questions or updates, please contact [facility] Please consult [email or web address] for contact info under   Signed, Dr. Jose Persia Internal Medicine PGY-3  10/25/2021, 9:14 AM  Patient seen and examined and agree with with Jose Persia, MD as detailed above.   In brief, the patient is a 61 y.o. male with a history of CAD s/p recent CABG x1 (LIMA to LAD) on 10/05/2021, severe aortic stenosis s/p recent AVR on 10/05/2021, paroxysmal SVT, hypertension, hyperlipidemia, type 2 diabetes mellitus, s/p nephrectomy at age 66 for congential nonfunctional left kidney and subsequent ESRD previously on dialysis and then renal transplant in 2018, obstructive sleep apnea, and obesity who presented to the ER with dizziness and weakness found to have severe HTN for which Cardiology has been consulted.   Patient had recent CABG AVR on 10/05/21 with post-op course complicated by Afib requiring amiodarone, AKI  on CKD, and perioperative CVA with multiple embolic lesions on MRI. He was initiated  on warfarin and discharged to SNF. At rehab, he has been having episodes of dizziness/lightheadedness where his BP is markedly elevated to the 200s. Has had a history of HTN prior to surgery but has never run this high. He denies chest pain but has been having ongoing short windedness. Recent INRs subtherapeutic.    Currently, the patient feels much better. TTE 10/24/21 with EF 60-65%, normal functioning AoV prosthesis with MG 54mmHg, normal RV, RAP 12mmHg. V/Q scan negative for PE. His anti-anxiety medications have been increased and he is feeling much better. BP still remains elevated in 150s but better than before.    GEN: Comfortable Neck: JVD difficult to assess due to body habitus Cardiac: RRR, 2/6 systolic murmur Respiratory: Diminished at the bases but otherwise clear GI: Obese, soft, ND MS: Minimal edema; warm Neuro:  Nonfocal  Psych: Calm    Plan: -TTE with normal functioning AoV, normal EF, normal RV -V/Q scan negative for PE -Continue amlodipine 10mg  daily -Continue lisinopril 40mg  daily (may need to hold pending Cr) -Continue coreg 25mg  BID -Continue hydralazine 100mg  TID -Increase imdur to 90mg  daily; if unable to tolerate due to HA, will need to stop -Warfarin per pharmacy -Continue amiodarone 200mg  BID -S/p renal transplant; on immunosuppresion -Management of anxiety per primary   Gwyndolyn Kaufman, MD

## 2021-10-25 NOTE — Progress Notes (Signed)
PT Cancellation Note  Patient Details Name: Robert [name] MRN: [id number] DOB: Nov 22, 1960   Cancelled Treatment:    Reason Eval/Treat Not Completed: (P) Medical issues which prohibited therapy Pt had an adverse reaction to one of his new medications and RN requested that PT follow back for treatment tomorrow.   Kelda Azad B. Migdalia Dk PT, DPT Acute Rehabilitation Services Pager [phone] Office [phone]    [place] 10/25/2021, 4:32 PM

## 2021-10-25 NOTE — Progress Notes (Addendum)
ANTICOAGULATION CONSULT NOTE  Pharmacy Consult for enoxaparin>warfarin Indication: atrial fibrillation  No Known Allergies  Patient Measurements: Height: 5\' 11"  (180.3 cm) Weight: 130 kg (286 lb 9.6 oz) IBW/kg (Calculated) : 75.3  Vital Signs: Temp: 97 F (36.1 C) (01/18 0842) Temp Source: Oral (01/18 0842) BP: 156/72 (01/18 0842) Pulse Rate: 73 (01/18 0842)  Labs: Recent Labs    10/23/21 0227 10/24/21 0305 10/24/21 0811 10/25/21 0309  HGB 12.1* 11.9*  --  11.7*  HCT 37.5* 36.9*  --  36.6*  PLT 284 268  --  262  LABPROT 17.6*  --  18.7* 21.1*  INR 1.5*  --  1.6* 1.8*  CREATININE 2.17* 2.12* 2.04* 2.46*     Estimated Creatinine Clearance: 42.8 mL/min (A) (by C-G formula based on SCr of 2.46 mg/dL (H)).   Medical History: Past Medical History:  Diagnosis Date   Anemia    low hgb at present   Arthritis    FEET   Blood transfusion without reported diagnosis 1974   kidney removed - L   Cancer ([place])    skin ca right shoulder, plastic dsyplasia, pre-Ca polpys removed on Colonoscopy- 07/2014   Colon polyps 07/23/2014   Tubular adenomas x 5   Constipation    Coronary artery disease    Diabetes mellitus without complication ([place])    Type 2   Difficult intubation    unsure of actual problem but it was during the January 28, 2015 procedure.   Dysrhythmia    Eczema    ESRD (end stage renal disease) ([place])    hemodialysis 06/2014-02/2017, s/p living unrelated kidney transplant 02/26/17   Headache(784.0)    slight dull h/a due kidney failure   Heart murmur    aortic stenosis (moderate-severe 04/2021)   Hyperlipidemia    Hypertension    SVT h/o , followed by Dr. Claiborne Billings   Hypothyroidism    Macular degeneration    Neuropathy    right hand and both feet   PAT (paroxysmal atrial tachycardia) ([facility])    PSVT (paroxysmal supraventricular tachycardia) ([facility])    SBO (small bowel obstruction) ([place]) 05/2015   Shortness of breath    with exertion   Sleep apnea 06/18/2011    split-night sleep study- [facility]., BiPAP- 13-16   Thyroid disease    Umbilical hernia    s/p repair 04/28/15   Assessment: 52 yom s/p CABG and AVR 2 weeks ago. Warfarin per pharmacy consult placed for AFib.  Home dose of warfarin is 2.5 mg daily. Patient last taken 1/13 at 1800.  INR still low today at 1.8, slowly trending up as doses are escalated. CBC stable. No bleeding issues noted. Lovenox bridge started 1/15 d/t patient's history of embolic stroke.   Noted that his scr is up to 2.4 today. Crcl still >30 so will continue with bid dosing.   Goal of Therapy:  INR 2-2.5 (per 1/6 note by MD Bartle) Monitor platelets by anticoagulation protocol: Yes   Plan:  Warfarin 7.5 mg tonight  Continue Lovenox 120 mg q12 hours Monitor for DDIs Monitor CBC, Daily INR  Thank you, Erin Hearing PharmD., BCPS Clinical Pharmacist 10/25/2021 2:11 PM

## 2021-10-25 NOTE — Progress Notes (Signed)
PROGRESS NOTE    Robert [name]  FXT:[id number] DOB: [birth year] DOA: 10/21/2021 PCP: Haywood Pao, MD    Brief Narrative:  61 year old with multiple medical issues including insulin-dependent diabetes type 2, history of renal transplant with CKD stage IIIb, hypertension, hypothyroidism, recent coronary artery disease and severe aortic stenosis is status post CABG and AVR on 10/05/21, postoperative A. fib and embolic CVA currently on warfarin presented from SNF with dizziness, nausea and elevated blood pressures.  Upon arrival to the emergency room blood pressure was 204/119, chest x-ray with bibasilar atelectasis.  Head CT subacute to small vessel ischemic changes.  Admitted to the hospital for following conditions.   Assessment & Plan:   Hypertensive urgency: Presented with multiple intermittent dizziness and nausea in the context of severely elevated blood pressures. Currently blood pressure is stabilizing on amlodipine 10 mg daily, lisinopril 40 mg daily, carvedilol 25 twice daily, hydralazine 100 mg 3 times daily, Imdur 90 mg daily, increased dose today.  Followed by cardiology.  Blood pressure acceptable today. Patient complains of episode of sharp throbbing headache, unlikely from nitrates.  Will monitor.  CAD status post CABG, aortic stenosis status post AVR: Fairly stable.  Normal ejection fraction.  INR 1.8, pharmacy dosing Coumadin.  Bridging with Lovenox.  Recent stroke: Suffered embolic a stroke in the setting of postop A. fib, currently on warfarin and Lipitor.  Paroxysmal A. fib: Currently sinus rhythm.  On warfarin.  Metoprolol and amiodarone.  CKD stage IIIb/status post renal transplant: Serum creatinine at about baseline.  2.04-2.3.  Continue CellCept and prednisone.  Close monitoring.  Insulin-dependent type 2 diabetes: Known A1c 7.1.  Currently on insulin regimen with better control.  Anxiety with panic attacks: Improved with alprazolam.  Short-term use.  He is  already on duloxetine.  Hypothyroidism: On levothyroxine.  Continue.  Obstructive sleep apnea: Using CPAP at night.  Continue.   DVT prophylaxis:   Lovenox.  Coumadin.   Code Status: Full code Family Communication: Mother at the bedside Disposition Plan: Status is: Inpatient  Remains inpatient appropriate because: Persistent symptoms         Consultants:  Cardiology  Procedures:  None  Antimicrobials:  None   Subjective: Patient seen and examined in the morning rounds.  His mother was at the bedside.  Told me that he got slight better sleep tonight.  He is less shaky after using Xanax.  Wants to work with therapist today.  Look forward to go back to rehab.  Telemetry with sinus rhythm.  Patient complained of episode of severe headache bitemporal without dizziness or lightheadedness.  Improved with Tylenol.  Objective: Vitals:   10/24/21 1935 10/24/21 2345 10/25/21 0500 10/25/21 0842  BP: (!) 142/63 (!) 154/72 (!) 147/61 (!) 156/72  Pulse: 68  70 73  Resp:      Temp: (!) 97 F (36.1 C)  (!) 97.4 F (36.3 C) (!) 97 F (36.1 C)  TempSrc: Oral  Oral Oral  SpO2: 96%  97% 95%  Weight:   130 kg   Height:        Intake/Output Summary (Last 24 hours) at 10/25/2021 1233 Last data filed at 10/25/2021 1000 Gross per 24 hour  Intake 360 ml  Output 975 ml  Net -615 ml   Filed Weights   10/21/21 1036 10/25/21 0500  Weight: 122.5 kg 130 kg    Examination:  General exam: Appears calm and comfortable  Respiratory system: Clear to auscultation. Respiratory effort normal. Cardiovascular system: S1 & S2 heard,  RRR.  Holosystolic murmur at the aortic area.  No edema.  Midline surgical incision clean and dry. Gastrointestinal system: Abdomen is nondistended, soft and nontender. No organomegaly or masses felt. Normal bowel sounds heard. Central nervous system: Alert and oriented. No focal neurological deficits. Extremities: Symmetric 5 x 5 power. Skin: No rashes, lesions  or ulcers Psychiatry: Judgement and insight appear normal. Mood & affect appropriate.     Data Reviewed: I have personally reviewed following labs and imaging studies  CBC: Recent Labs  Lab 10/21/21 1634 10/22/21 0411 10/23/21 0227 10/24/21 0305 10/25/21 0309  WBC 8.4 9.6 8.2 6.6 6.8  NEUTROABS 6.7  --  6.4 4.9 5.1  HGB 12.3* 12.5* 12.1* 11.9* 11.7*  HCT 38.7* 38.3* 37.5* 36.9* 36.6*  MCV 88.4 88.2 87.4 87.4 89.1  PLT 376 338 284 268 789   Basic Metabolic Panel: Recent Labs  Lab 10/22/21 0411 10/23/21 0227 10/24/21 0305 10/24/21 0811 10/25/21 0309  NA 137 134* 135 133* 135  K 4.2 4.1 4.2 4.2 4.6  CL 105 106 106 105 106  CO2 23 19* 20* 20* 20*  GLUCOSE 168* 224* 174* 175* 172*  BUN 31* 36* 37* 34* 40*  CREATININE 2.06* 2.17* 2.12* 2.04* 2.46*  CALCIUM 9.5 9.4 9.6 9.7 9.5  MG 1.8 1.8 1.8  --  1.9  PHOS  --  3.1 3.2  --  3.5   GFR: Estimated Creatinine Clearance: 42.8 mL/min (A) (by C-G formula based on SCr of 2.46 mg/dL (H)). Liver Function Tests: Recent Labs  Lab 10/21/21 1634 10/23/21 0227 10/24/21 0305 10/25/21 0309  AST 18 14* 13* 11*  ALT 23 19 17 17   ALKPHOS 102 88 90 85  BILITOT 0.8 0.9 0.8 0.6  PROT 5.6* 5.3* 5.2* 5.1*  ALBUMIN 3.3* 3.0* 3.1* 3.1*   No results for input(s): LIPASE, AMYLASE in the last 168 hours. No results for input(s): AMMONIA in the last 168 hours. Coagulation Profile: Recent Labs  Lab 10/21/21 1634 10/22/21 0411 10/23/21 0227 10/24/21 0811 10/25/21 0309  INR 1.3* 1.3* 1.5* 1.6* 1.8*   Cardiac Enzymes: No results for input(s): CKTOTAL, CKMB, CKMBINDEX, TROPONINI in the last 168 hours. BNP (last 3 results) No results for input(s): PROBNP in the last 8760 hours. HbA1C: No results for input(s): HGBA1C in the last 72 hours. CBG: Recent Labs  Lab 10/24/21 1147 10/24/21 1552 10/24/21 2107 10/25/21 0737 10/25/21 1109  GLUCAP 207* 177* 197* 163* 174*   Lipid Profile: No results for input(s): CHOL, HDL, LDLCALC,  TRIG, CHOLHDL, LDLDIRECT in the last 72 hours. Thyroid Function Tests: No results for input(s): TSH, T4TOTAL, FREET4, T3FREE, THYROIDAB in the last 72 hours. Anemia Panel: No results for input(s): VITAMINB12, FOLATE, FERRITIN, TIBC, IRON, RETICCTPCT in the last 72 hours. Sepsis Labs: No results for input(s): PROCALCITON, LATICACIDVEN in the last 168 hours.  Recent Results (from the past 240 hour(s))  Resp Panel by RT-PCR (Flu A&B, Covid) Nasopharyngeal Swab     Status: None   Collection Time: 10/18/21 12:00 PM   Specimen: Nasopharyngeal Swab; Nasopharyngeal(NP) swabs in vial transport medium  Result Value Ref Range Status   SARS Coronavirus 2 by RT PCR NEGATIVE NEGATIVE Final    Comment: (NOTE) SARS-CoV-2 target nucleic acids are NOT DETECTED.  The SARS-CoV-2 RNA is generally detectable in upper respiratory specimens during the acute phase of infection. The lowest concentration of SARS-CoV-2 viral copies this assay can detect is 138 copies/mL. A negative result does not preclude SARS-Cov-2 infection and should not be used  as the sole basis for treatment or other patient management decisions. A negative result may occur with  improper specimen collection/handling, submission of specimen other than nasopharyngeal swab, presence of viral mutation(s) within the areas targeted by this assay, and inadequate number of viral copies(<138 copies/mL). A negative result must be combined with clinical observations, patient history, and epidemiological information. The expected result is Negative.  Fact Sheet for Patients:  EntrepreneurPulse.com.au  Fact Sheet for Healthcare Providers:  IncredibleEmployment.be  This test is no t yet approved or cleared by the [place] FDA and  has been authorized for detection and/or diagnosis of SARS-CoV-2 by FDA under an Emergency Use Authorization (EUA). This EUA will remain  in effect (meaning this test can be  used) for the duration of the COVID-19 declaration under Section 564(b)(1) of the Act, 21 U.S.C.section 360bbb-3(b)(1), unless the authorization is terminated  or revoked sooner.       Influenza A by PCR NEGATIVE NEGATIVE Final   Influenza B by PCR NEGATIVE NEGATIVE Final    Comment: (NOTE) The Xpert Xpress SARS-CoV-2/FLU/RSV plus assay is intended as an aid in the diagnosis of influenza from Nasopharyngeal swab specimens and should not be used as a sole basis for treatment. Nasal washings and aspirates are unacceptable for Xpert Xpress SARS-CoV-2/FLU/RSV testing.  Fact Sheet for Patients: EntrepreneurPulse.com.au  Fact Sheet for Healthcare Providers: IncredibleEmployment.be  This test is not yet approved or cleared by the [place] FDA and has been authorized for detection and/or diagnosis of SARS-CoV-2 by FDA under an Emergency Use Authorization (EUA). This EUA will remain in effect (meaning this test can be used) for the duration of the COVID-19 declaration under Section 564(b)(1) of the Act, 21 U.S.C. section 360bbb-3(b)(1), unless the authorization is terminated or revoked.  Performed at [facility], [place] [address], [place], [place] [address]   Resp Panel by RT-PCR (Flu A&B, Covid) Nasopharyngeal Swab     Status: None   Collection Time: 10/21/21 10:39 PM   Specimen: Nasopharyngeal Swab; Nasopharyngeal(NP) swabs in vial transport medium  Result Value Ref Range Status   SARS Coronavirus 2 by RT PCR NEGATIVE NEGATIVE Final    Comment: (NOTE) SARS-CoV-2 target nucleic acids are NOT DETECTED.  The SARS-CoV-2 RNA is generally detectable in upper respiratory specimens during the acute phase of infection. The lowest concentration of SARS-CoV-2 viral copies this assay can detect is 138 copies/mL. A negative result does not preclude SARS-Cov-2 infection and should not be used as the sole basis for treatment or other patient  management decisions. A negative result may occur with  improper specimen collection/handling, submission of specimen other than nasopharyngeal swab, presence of viral mutation(s) within the areas targeted by this assay, and inadequate number of viral copies(<138 copies/mL). A negative result must be combined with clinical observations, patient history, and epidemiological information. The expected result is Negative.  Fact Sheet for Patients:  EntrepreneurPulse.com.au  Fact Sheet for Healthcare Providers:  IncredibleEmployment.be  This test is no t yet approved or cleared by the [place] FDA and  has been authorized for detection and/or diagnosis of SARS-CoV-2 by FDA under an Emergency Use Authorization (EUA). This EUA will remain  in effect (meaning this test can be used) for the duration of the COVID-19 declaration under Section 564(b)(1) of the Act, 21 U.S.C.section 360bbb-3(b)(1), unless the authorization is terminated  or revoked sooner.       Influenza A by PCR NEGATIVE NEGATIVE Final   Influenza B by PCR NEGATIVE NEGATIVE Final  Comment: (NOTE) The Xpert Xpress SARS-CoV-2/FLU/RSV plus assay is intended as an aid in the diagnosis of influenza from Nasopharyngeal swab specimens and should not be used as a sole basis for treatment. Nasal washings and aspirates are unacceptable for Xpert Xpress SARS-CoV-2/FLU/RSV testing.  Fact Sheet for Patients: EntrepreneurPulse.com.au  Fact Sheet for Healthcare Providers: IncredibleEmployment.be  This test is not yet approved or cleared by the [place] FDA and has been authorized for detection and/or diagnosis of SARS-CoV-2 by FDA under an Emergency Use Authorization (EUA). This EUA will remain in effect (meaning this test can be used) for the duration of the COVID-19 declaration under Section 564(b)(1) of the Act, 21 U.S.C. section 360bbb-3(b)(1),  unless the authorization is terminated or revoked.  Performed at [facility], [place] [address] [place], [place] [address]          Radiology Studies: NM Pulmonary Perfusion  Result Date: 10/24/2021 CLINICAL DATA:  Onset of chest pain this morning, anxiousness, dyspnea, history of CABG 10/05/2021, AVR 10/05/2021, hypertension, type II diabetes mellitus, paroxysmal SVT EXAM: NUCLEAR MEDICINE PERFUSION LUNG SCAN TECHNIQUE: Perfusion images were obtained in multiple projections after intravenous injection of radiopharmaceutical. Ventilation scans intentionally deferred if perfusion scan and chest x-ray adequate for interpretation during COVID 19 epidemic. RADIOPHARMACEUTICALS:  4.1 mCi Tc-40m MAA IV COMPARISON:  Chest radiograph 10/24/2021 FINDINGS: Flattening of posterior lungs consistent with layered pleural effusions. No segmental or subsegmental perfusion defects. IMPRESSION: BILATERAL pleural effusions. Pulmonary embolism absent. Electronically Signed   By: Lavonia Dana M.D.   On: 10/24/2021 13:47   DG CHEST PORT 1 VIEW  Result Date: 10/25/2021 CLINICAL DATA:  Shortness of breath, CABG EXAM: PORTABLE CHEST 1 VIEW COMPARISON:  Multiple prior chest radiographs including most recent dated October 24, 2021 FINDINGS: The heart is normal in size. Evidence of prior CABG. Pulmonary vascular congestion with small bilateral pleural effusions, unchanged. No pneumothorax. Osseous structures are unremarkable. IMPRESSION: Mild pulmonary vascular congestion and small bilateral pleural effusions, unchanged. Electronically Signed   By: Keane Police D.O.   On: 10/25/2021 09:10   DG Chest Port 1 View  Result Date: 10/24/2021 CLINICAL DATA:  Bypass surgery. EXAM: PORTABLE CHEST 1 VIEW COMPARISON:  10/21/2021 FINDINGS: The cardiac silhouette, mediastinal and hilar contours are within normal limits and stable. Stable surgical changes bypass surgery. There are persistent bilateral pleural effusions and  bibasilar atelectasis but overall slight improved aeration. No pneumothorax. IMPRESSION: Persistent bilateral pleural effusions and bibasilar atelectasis. No edema or pneumothorax. Electronically Signed   By: Marijo Sanes M.D.   On: 10/24/2021 08:03   ECHOCARDIOGRAM LIMITED  Result Date: 10/24/2021    ECHOCARDIOGRAM LIMITED REPORT   Patient Name:   Robert [name] Date of Exam: 10/24/2021 Medical Rec #:  [id number]        Height:       71.0 in Accession #:    [id number]       Weight:       270.0 lb Date of Birth:  27-Aug-1961        BSA:          2.395 m Patient Age:    78 years         BP:           159/80 mmHg Patient Gender: M                HR:           76 bpm. Exam Location:  Inpatient Procedure: Limited Echo, Cardiac  Doppler and Color Doppler Indications:    Dyspnea  History:        Patient has prior history of Echocardiogram examinations, most                 recent 05/02/2021. Risk Factors:Diabetes and Hypertension.                 Aortic Valve: 27 mm Edwards INSPIRIS RESILIA pericardial valve                 valve is present in the aortic position. Procedure Date:                 10/05/2021.  Sonographer:    Glo Herring Referring Phys: [id number] [place]  1. Left ventricular ejection fraction, by estimation, is 60 to 65%. The left ventricle has normal function. The left ventricle has no regional wall motion abnormalities. There is moderate concentric left ventricular hypertrophy.  2. Right ventricular systolic function is normal. The right ventricular size is normal.  3. The mitral valve is degenerative.  4. Normal 27 mm Edwards Inspiris Resilia aortic prosthesis. Vmax 2.3 m/s, MG 12 mmHG, EOA 2.14 cm2, DI 0.26. The aortic valve has been repaired/replaced. Aortic valve regurgitation is not visualized. There is a 27 mm Edwards INSPIRIS RESILIA pericardial  valve valve present in the aortic position. Procedure Date: 10/05/2021.  5. The inferior vena cava is dilated in size with >50%  respiratory variability, suggesting right atrial pressure of 8 mmHg. Comparison(s): Changes from prior study are noted. AoV now replaced. FINDINGS  Left Ventricle: Left ventricular ejection fraction, by estimation, is 60 to 65%. The left ventricle has normal function. The left ventricle has no regional wall motion abnormalities. The left ventricular internal cavity size was normal in size. There is  moderate concentric left ventricular hypertrophy. Abnormal (paradoxical) septal motion consistent with post-operative status. Right Ventricle: The right ventricular size is normal. No increase in right ventricular wall thickness. Right ventricular systolic function is normal. Left Atrium: Left atrial size was normal in size. Right Atrium: Right atrial size was normal in size. Pericardium: Trivial pericardial effusion is present. Mitral Valve: The mitral valve is degenerative in appearance. Mild to moderate mitral annular calcification. Tricuspid Valve: The tricuspid valve is grossly normal. Tricuspid valve regurgitation is trivial. No evidence of tricuspid stenosis. Aortic Valve: Normal 27 mm Edwards Inspiris Resilia aortic prosthesis. Vmax 2.3 m/s, MG 12 mmHG, EOA 2.14 cm2, DI 0.26. The aortic valve has been repaired/replaced. Aortic valve regurgitation is not visualized. Aortic valve mean gradient measures 12.0 mmHg. Aortic valve peak gradient measures 21.7 mmHg. Aortic valve area, by VTI measures 2.14 cm. There is a 27 mm Edwards INSPIRIS RESILIA pericardial valve valve present in the aortic position. Procedure Date: 10/05/2021. Venous: The inferior vena cava is dilated in size with greater than 50% respiratory variability, suggesting right atrial pressure of 8 mmHg. LEFT VENTRICLE PLAX 2D LVIDd:         4.70 cm   Diastology LVIDs:         3.30 cm   LV e' medial:    7.13 cm/s LV PW:         1.25 cm   LV E/e' medial:  17.5 LV IVS:        1.49 cm   LV e' lateral:   6.45 cm/s LVOT diam:     2.10 cm   LV E/e' lateral:  19.4 LV SV:  92 LV SV Index:   38 LVOT Area:     3.46 cm  IVC IVC diam: 2.30 cm LEFT ATRIUM         Index LA diam:    5.30 cm 2.21 cm/m  AORTIC VALVE AV Area (Vmax):    2.14 cm AV Area (Vmean):   2.30 cm AV Area (VTI):     2.14 cm AV Vmax:           233.00 cm/s AV Vmean:          161.000 cm/s AV VTI:            0.428 m AV Peak Grad:      21.7 mmHg AV Mean Grad:      12.0 mmHg LVOT Vmax:         144.00 cm/s LVOT Vmean:        107.000 cm/s LVOT VTI:          0.265 m LVOT/AV VTI ratio: 0.62 MITRAL VALVE MV Area (PHT): 2.61 cm     SHUNTS MV Decel Time: 291 msec     Systemic VTI:  0.26 m MV E velocity: 125.00 cm/s  Systemic Diam: 2.10 cm MV A velocity: 112.00 cm/s MV E/A ratio:  1.12 Eleonore Chiquito MD Electronically signed by Eleonore Chiquito MD Signature Date/Time: 10/24/2021/12:42:32 PM    Final         Scheduled Meds:  amiodarone  200 mg Oral BID   amLODipine  10 mg Oral Daily   atorvastatin  80 mg Oral Daily   carvedilol  25 mg Oral BID WC   clonazepam  0.125 mg Oral BID   DULoxetine  60 mg Oral Daily   enoxaparin (LOVENOX) injection  120 mg Subcutaneous Q12H   gabapentin  200 mg Oral Daily   hydrALAZINE  100 mg Oral TID   insulin aspart  0-5 Units Subcutaneous QHS   insulin aspart  0-9 Units Subcutaneous TID WC   insulin aspart  2 Units Subcutaneous TID WC   [START ON 10/26/2021] isosorbide mononitrate  90 mg Oral Daily   levothyroxine  100 mcg Oral QAC breakfast   lisinopril  40 mg Oral Daily   mouth rinse  15 mL Mouth Rinse BID   mycophenolate  1,000 mg Oral Q12H   predniSONE  5 mg Oral Q breakfast   sodium chloride flush  3 mL Intravenous Q12H   tamsulosin  0.8 mg Oral QHS   Warfarin - Pharmacist Dosing Inpatient   Does not apply q1600   Continuous Infusions:   LOS: 2 days    Time spent: 35 minutes    Barb Merino, MD [facility] Pager [phone]

## 2021-10-25 NOTE — Progress Notes (Signed)
HOSPITAL MEDICINE OVERNIGHT EVENT NOTE    Notified by nursing that patient has been complaining of ongoing headache for at least the past 4 to 6 hours.  Nursing reports there is no change in mentation and no focal neurologic deficit.  Headaches seem to be persisting despite being given Tylenol earlier in the evening.  We will repeat a dose of Tylenol now.  If headache continues to persist headaches may be a side effect due to newly initiated Imdur on 1/17 and this drug may need to be discontinued.  Robert Emerald  MD [facility]

## 2021-10-25 NOTE — Progress Notes (Addendum)
Occupational Therapy Treatment Patient Details Name: Robert [name] MRN: [id number] DOB: Mar 23, 1961 Today's Date: 10/25/2021   History of present illness 61 y.o. male presents to [facility] hospital on 10/21/2021 with dizziness, nausea, and hypertension. PMH includes IDDM, CKD s/p renal transplant, HRTN, CAD s/p CABG 09/2022.   OT comments  Pt progressing towards goals this session, eager to move, however limited by dizziness. Pt takes increased time (~7 mins) sitting EOB for dizziness to resolve. Pt max A for LB ADL, min A for transfers with RW. Min A for bed mobility. Requires increased cuing to follow sternal precautions. Pt BP taken in supine 158/66, seated EOB 172/99, standing 176/104, and seated after transfer 144/132. Pt's mother at bedside throughout session and is very supportive, MD in room at end of session. Pt presenting with impairments listed below, will continue to follow acutely. Recommend SNF at d/c.   Recommendations for follow up therapy are one component of a multi-disciplinary discharge planning process, led by the attending physician.  Recommendations may be updated based on patient status, additional functional criteria and insurance authorization.    Follow Up Recommendations  Skilled nursing-short term rehab (<3 hours/day)    Assistance Recommended at Discharge Intermittent Supervision/Assistance  Patient can return home with the following  A lot of help with walking and/or transfers;A lot of help with bathing/dressing/bathroom;Direct supervision/assist for medications management;Assistance with cooking/housework;Assist for transportation;Help with stairs or ramp for entrance   Equipment Recommendations  BSC/3in1    Recommendations for Other Services PT consult    Precautions / Restrictions Precautions Precautions: Fall;Sternal Precaution Booklet Issued: No Precaution Comments: R arm restriction due to fistula Restrictions Weight Bearing Restrictions: No Other  Position/Activity Restrictions: sternal precautions       Mobility Bed Mobility Overal bed mobility: Needs Assistance Bed Mobility: Rolling, Sidelying to Sit Rolling: Min assist Sidelying to sit: HOB elevated, Mod assist Supine to sit: Min assist, HOB elevated     General bed mobility comments: assist for trunk elevation    Transfers Overall transfer level: Needs assistance Equipment used: Rolling walker (2 wheels) Transfers: Sit to/from Stand, Bed to chair/wheelchair/BSC Sit to Stand: Min assist Stand pivot transfers: Min assist               Balance Overall balance assessment: Needs assistance Sitting-balance support: No upper extremity supported, Feet supported Sitting balance-Leahy Scale: Fair     Standing balance support: Bilateral upper extremity supported, Reliant on assistive device for balance, During functional activity Standing balance-Leahy Scale: Poor                             ADL either performed or assessed with clinical judgement   ADL                       Lower Body Dressing: Maximal assistance;Sitting/lateral leans Lower Body Dressing Details (indicate cue type and reason): don socks sitting EOB Toilet Transfer: Minimal assistance;Stand-pivot;BSC/3in1;Rolling walker (2 wheels) Toilet Transfer Details (indicate cue type and reason): increased time needed due to dizziness                Extremity/Trunk Assessment Upper Extremity Assessment Upper Extremity Assessment: Generalized weakness   Lower Extremity Assessment Lower Extremity Assessment: Defer to PT evaluation        Vision   Vision Assessment?: No apparent visual deficits   Perception Perception Perception: Not tested   Praxis Praxis Praxis: Not tested    Cognition  Arousal/Alertness: Awake/alert Behavior During Therapy: WFL for tasks assessed/performed, Anxious Overall Cognitive Status: Within Functional Limits for tasks assessed                                           Exercises      Shoulder Instructions       General Comments Pt reporting continued dizziness during session, BP taken in supine (158/66) seated EOB 172/99, standing 176/104 seated after transfer 144/132    Pertinent Vitals/ Pain       Pain Assessment Pain Assessment: Faces Pain Score: 3  Pain Location: head Pain Descriptors / Indicators: Headache Pain Intervention(s): Limited activity within patient's tolerance, Monitored during session, Repositioned  Home Living                                          Prior Functioning/Environment              Frequency  Min 2X/week        Progress Toward Goals  OT Goals(current goals can now be found in the care plan section)  Progress towards OT goals: Progressing toward goals  Acute Rehab OT Goals Patient Stated Goal: none stated OT Goal Formulation: With patient Time For Goal Achievement: 11/06/21 Potential to Achieve Goals: Good ADL Goals Pt Will Perform Grooming: with set-up;sitting Pt Will Perform Upper Body Dressing: with min assist;sitting Pt Will Perform Lower Body Dressing: with mod assist;with adaptive equipment;sit to/from stand Pt Will Transfer to Toilet: with min guard assist;stand pivot transfer;bedside commode Additional ADL Goal #1: pt will complete bed mobility mod (A) with HOB 35 degrees as precursor to adls. Additional ADL Goal #2: pt will verbalize sternal precautsions mod I without cues  Plan Discharge plan remains appropriate;Frequency remains appropriate    Co-evaluation                 AM-PAC OT "6 Clicks" Daily Activity     Outcome Measure   Help from another person eating meals?: None Help from another person taking care of personal grooming?: A Little Help from another person toileting, which includes using toliet, bedpan, or urinal?: A Lot Help from another person bathing (including washing, rinsing, drying)?: A Lot Help  from another person to put on and taking off regular upper body clothing?: A Little Help from another person to put on and taking off regular lower body clothing?: A Lot 6 Click Score: 16    End of Session Equipment Utilized During Treatment: Gait belt;Rolling walker (2 wheels)  OT Visit Diagnosis: Unsteadiness on feet (R26.81);Muscle weakness (generalized) (M62.81)   Activity Tolerance Patient limited by fatigue   Patient Left in chair;with call bell/phone within reach;Other (comment) (MD in room)   Nurse Communication Mobility status;Patient requests pain meds;Other (comment)        Time: 4332-9518 OT Time Calculation (min): 27 min  Charges: OT General Charges $OT Visit: 1 Visit OT Treatments $Self Care/Home Management : 8-22 mins $Therapeutic Activity: 8-22 mins  Lynnda Child, OTD, OTR/L Acute Rehab [phone]) [phone] - [place] 10/25/2021, 4:06 PM

## 2021-10-25 NOTE — NC FL2 (Signed)
[place] LEVEL OF CARE SCREENING TOOL     IDENTIFICATION  Patient Name: Robert [name] Birthdate: 1961/03/10 Sex: male Admission Date (Current Location): 10/21/2021  [facility] and [place] Number:  Herbalist and Address:  [facility] [place], [place] [address], [place], [place] [address]      Provider Number: [id number]  Attending Physician Name and Address:  Barb Merino, MD  Relative Name and Phone Number:  Verdis Frederickson [phone]    Current Level of Care: Hospital Recommended Level of Care: [place] Prior Approval Number:    Date Approved/Denied:   PASRR Number: [id number]  Discharge Plan: SNF    Current Diagnoses: Patient Active Problem List   Diagnosis Date Noted   Hypertensive urgency 10/21/2021   Cerebrovascular accident (CVA) due to embolism of precerebral artery (HCC)    S/P AVR (aortic valve replacement) 10/05/2021   Exertional dyspnea    Aortic valve stenosis    Fissure of skin 03/08/2020   AKI (acute kidney injury) ([place]) 08/08/2017   Diarrhea of presumed infectious origin 08/08/2017   GI bleed 07/26/2017   Kidney transplant status 07/26/2017   Inguinal hernia without obstruction or gangrene 06/30/2017   Tremor of both hands 06/29/2017   Diarrhea 06/28/2017   Hallucinations 06/28/2017   Need for prophylactic immunotherapy 05/20/2017   Hydrocele, acquired 04/07/2017   Scrotal edema 03/14/2017   H/O diabetic neuropathy 03/01/2017   Immunosuppressed status ([place]) 02/26/2017   Prophylactic antibiotic 02/26/2017   Benign neoplasm of transverse colon    Diverticulosis of colon without hemorrhage    Heme positive stool    Morbid obesity ([place]) 02/03/2016   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, with long-term current use of insulin 07/14/2015   Nausea & vomiting    HTN (hypertension) 05/11/2015   Chronic renal failure    Peritoneal dialysis catheter in place  [facility]) 02/24/2015   Protein-calorie malnutrition, severe ([place]) 06/15/2014   Uremia of renal origin 06/12/2014   Stage 3b chronic kidney disease (CKD) ([place]) 03/22/2014   Fatigue 01/08/2014   H/O unilateral nephrectomy 09/06/2013   Diabetes mellitus type 2 in obese ([place]) 09/06/2013   SVT (supraventricular tachycardia) ([place]) 09/06/2013   Lower extremity edema 09/06/2013   Severe obstructive sleep apnea 09/06/2013   Hypothyroidism 09/06/2013   Hyperlipidemia with target LDL less than 70 09/06/2013    Orientation RESPIRATION BLADDER Height & Weight     Self, Time, Situation, Place  Normal Continent Weight: 286 lb 9.6 oz (130 kg) Height:  5\' 11"  (976.7 cm)  BEHAVIORAL SYMPTOMS/MOOD NEUROLOGICAL BOWEL NUTRITION STATUS      Continent Diet (SEE DC SUMMARY)  AMBULATORY STATUS COMMUNICATION OF NEEDS Skin   Extensive Assist   Normal                       Personal Care Assistance Level of Assistance  Bathing, Feeding, Dressing Bathing Assistance: Maximum assistance Feeding assistance: Limited assistance Dressing Assistance: Maximum assistance     Functional Limitations Info  Sight, Hearing, Speech Sight Info: Adequate Hearing Info: Adequate Speech Info: Adequate    SPECIAL CARE FACTORS FREQUENCY  PT (By licensed PT), OT (By licensed OT)     PT Frequency: 5X OT Frequency: 5X            Contractures Contractures Info: Not present    Additional Factors Info  Code Status, Psychotropic, Insulin Sliding Scale Code Status Info: Full   Psychotropic  Info: ALPRAZOLAM 2X DAILY Insulin Sliding Scale Info: insulin aspart (novoLOG) injection 0-9 Units 3 times daily with meals,insulin detemir (LEVEMIR) injection 30 Units 2 times daily       Current Medications (10/25/2021):  This is the current hospital active medication list Current Facility-Administered Medications  Medication Dose Route Frequency Provider Last Rate Last Admin   acetaminophen (TYLENOL) tablet 325 mg  325 mg  Oral Q4H PRN Jose Persia, MD       ALPRAZolam Duanne Moron) tablet 0.25 mg  0.25 mg Oral TID PRN Opyd, Ilene Qua, MD   0.25 mg at 10/25/21 1149   amiodarone (PACERONE) tablet 200 mg  200 mg Oral BID Opyd, Ilene Qua, MD   200 mg at 10/25/21 0849   amLODipine (NORVASC) tablet 10 mg  10 mg Oral Daily Opyd, Ilene Qua, MD   10 mg at 10/25/21 0849   atorvastatin (LIPITOR) tablet 80 mg  80 mg Oral Daily Opyd, Ilene Qua, MD   80 mg at 10/25/21 0850   carvedilol (COREG) tablet 25 mg  25 mg Oral BID WC Jose Persia, MD   25 mg at 10/25/21 0850   DULoxetine (CYMBALTA) DR capsule 60 mg  60 mg Oral Daily Opyd, Ilene Qua, MD   60 mg at 10/25/21 0850   enoxaparin (LOVENOX) injection 120 mg  120 mg Subcutaneous Q12H Sheikh, Georgina Quint [place], DO   120 mg at 10/25/21 6073   gabapentin (NEURONTIN) capsule 200 mg  200 mg Oral Daily Opyd, Ilene Qua, MD   200 mg at 10/25/21 0848   hydrALAZINE (APRESOLINE) injection 10 mg  10 mg Intravenous Q6H PRN Raiford Noble Latif, DO   10 mg at 10/24/21 7106   hydrALAZINE (APRESOLINE) tablet 100 mg  100 mg Oral TID Sande Rives E, PA-C   100 mg at 10/25/21 0849   insulin aspart (novoLOG) injection 0-5 Units  0-5 Units Subcutaneous QHS Vianne Bulls, MD   2 Units at 10/23/21 2157   insulin aspart (novoLOG) injection 0-9 Units  0-9 Units Subcutaneous TID WC Opyd, Ilene Qua, MD   2 Units at 10/25/21 1149   insulin aspart (novoLOG) injection 2 Units  2 Units Subcutaneous TID WC Raiford Noble [place], DO   2 Units at 10/25/21 1148   labetalol (NORMODYNE) injection 10 mg  10 mg Intravenous Q2H PRN Raiford Noble Latif, DO   10 mg at 10/23/21 1751   levothyroxine (SYNTHROID) tablet 100 mcg  100 mcg Oral QAC breakfast Opyd, Ilene Qua, MD   100 mcg at 10/25/21 0616   lisinopril (ZESTRIL) tablet 40 mg  40 mg Oral Daily Opyd, Ilene Qua, MD   40 mg at 10/25/21 0848   MEDLINE mouth rinse  15 mL Mouth Rinse BID Raiford Noble [place], DO   15 mL at 10/25/21 2694   mycophenolate (CELLCEPT) capsule  1,000 mg  1,000 mg Oral Q12H Opyd, Ilene Qua, MD   1,000 mg at 10/25/21 0848   ondansetron (ZOFRAN) tablet 4 mg  4 mg Oral Q6H PRN Opyd, Ilene Qua, MD       Or   ondansetron (ZOFRAN) injection 4 mg  4 mg Intravenous Q6H PRN Opyd, Ilene Qua, MD       predniSONE (DELTASONE) tablet 5 mg  5 mg Oral Q breakfast Opyd, Ilene Qua, MD   5 mg at 10/25/21 0849   senna-docusate (Senokot-S) tablet 1 tablet  1 tablet Oral QHS PRN Opyd, Ilene Qua, MD       sodium chloride flush (NS) 0.9 %  injection 3 mL  3 mL Intravenous Q12H Opyd, Ilene Qua, MD   3 mL at 10/25/21 0850   tamsulosin (FLOMAX) capsule 0.8 mg  0.8 mg Oral QHS Opyd, Ilene Qua, MD   0.8 mg at 10/24/21 2211   warfarin (COUMADIN) tablet 7.5 mg  7.5 mg Oral ONCE-1600 Lyndee Leo, [facility]       Warfarin - Pharmacist Dosing Inpatient   Does not apply q1600 Heloise Purpura [facility]   Given at 10/24/21 1719     Discharge Medications: Please see discharge summary for a list of discharge medications.  Relevant Imaging Results:  Relevant Lab Results:   Additional Information [phone], Both Covid Vaccines and 1 Booster  Milas Gain, LCSWA

## 2021-10-25 NOTE — Plan of Care (Signed)
  Problem: Education: Goal: Knowledge of General Education information will improve Description Including pain rating scale, medication(s)/side effects and non-pharmacologic comfort measures Outcome: Progressing   Problem: Health Behavior/Discharge Planning: Goal: Ability to manage health-related needs will improve Outcome: Progressing   

## 2021-10-25 NOTE — Progress Notes (Signed)
PT Cancellation Note  Patient Details Name: Robert [name] MRN: [id number] DOB: 09/27/1961   Cancelled Treatment:    Reason Eval/Treat Not Completed: (P) Other (comment) Pt is up in chair eating lunch after working with OT. PT will follow back this afternoon for treatment as able.   Debhora Titus B. Migdalia Dk PT, DPT Acute Rehabilitation Services Pager [phone] Office [phone]  [place] 10/25/2021, 12:16 PM

## 2021-10-25 NOTE — Progress Notes (Signed)
Pt has home CPAP at bedside and can place himself when ready for bed. Advised pt to notify for RT if any further assistance is needed.

## 2021-10-26 DIAGNOSIS — Z951 Presence of aortocoronary bypass graft: Secondary | ICD-10-CM

## 2021-10-26 DIAGNOSIS — R778 Other specified abnormalities of plasma proteins: Secondary | ICD-10-CM

## 2021-10-26 LAB — PROTIME-INR
INR: 2.4 — ABNORMAL HIGH (ref 0.8–1.2)
Prothrombin Time: 25.8 seconds — ABNORMAL HIGH (ref 11.4–15.2)

## 2021-10-26 LAB — BASIC METABOLIC PANEL
Anion gap: 8 (ref 5–15)
BUN: 47 mg/dL — ABNORMAL HIGH (ref 8–23)
CO2: 20 mmol/L — ABNORMAL LOW (ref 22–32)
Calcium: 9.6 mg/dL (ref 8.9–10.3)
Chloride: 105 mmol/L (ref 98–111)
Creatinine, Ser: 2.39 mg/dL — ABNORMAL HIGH (ref 0.61–1.24)
GFR, Estimated: 30 mL/min — ABNORMAL LOW (ref >60)
Glucose, Bld: 174 mg/dL — ABNORMAL HIGH (ref 70–99)
Potassium: 4.3 mmol/L (ref 3.5–5.1)
Sodium: 133 mmol/L — ABNORMAL LOW (ref 135–145)

## 2021-10-26 LAB — GLUCOSE, CAPILLARY
Glucose-Capillary: 170 mg/dL — ABNORMAL HIGH (ref 70–99)
Glucose-Capillary: 206 mg/dL — ABNORMAL HIGH (ref 70–99)

## 2021-10-26 MED ORDER — LORAZEPAM 0.5 MG PO TABS
0.5000 mg | ORAL_TABLET | Freq: Once | ORAL | Status: AC
  Administered 2021-10-26: 0.5 mg via ORAL
  Filled 2021-10-26: qty 1

## 2021-10-26 MED ORDER — DOXAZOSIN MESYLATE 2 MG PO TABS
2.0000 mg | ORAL_TABLET | Freq: Every day | ORAL | Status: DC
  Administered 2021-10-26: 2 mg via ORAL
  Filled 2021-10-26: qty 1

## 2021-10-26 MED ORDER — CARVEDILOL 25 MG PO TABS: 25.0000 mg | ORAL_TABLET | Freq: Two times a day (BID) | ORAL | Status: AC

## 2021-10-26 MED ORDER — INSULIN GLARGINE 100 UNITS/ML SOLOSTAR PEN: 10.0000 [IU] | PEN_INJECTOR | Freq: Every day | SUBCUTANEOUS | 11 refills | Status: DC

## 2021-10-26 MED ORDER — DOXAZOSIN MESYLATE 2 MG PO TABS: 2.0000 mg | ORAL_TABLET | Freq: Every day | ORAL | Status: DC

## 2021-10-26 MED ORDER — ALPRAZOLAM 0.25 MG PO TABS: 0.2500 mg | ORAL_TABLET | Freq: Three times a day (TID) | ORAL | 0 refills | Status: AC | PRN

## 2021-10-26 MED ORDER — SENNOSIDES-DOCUSATE SODIUM 8.6-50 MG PO TABS: 1.0000 | ORAL_TABLET | Freq: Every evening | ORAL | Status: AC | PRN

## 2021-10-26 NOTE — Progress Notes (Addendum)
Cardiology Consultation:   Patient ID: Robert [name] MRN: [id number]; DOB: 1960/11/29  Admit date: 10/21/2021 Date of Consult: 10/23/2021  PCP:  Haywood Pao, MD   [facility] Providers Cardiologist:  Shelva Majestic, MD   Patient Profile:   Robert [name] is a 61 y.o. male with a history of CAD s/p recent CABG x1 (LIMA to LAD) on 10/05/2021, severe aortic stenosis s/p recent AVR on 10/05/2021, paroxysmal SVT, hypertension, hyperlipidemia, type 2 diabetes mellitus, s/p nephrectomy at age 57 for congential nonfunctional left kidney and subsequent ESRD previously on dialysis and then renal transplant in 2018, obstructive sleep apnea, and obesity who is being seen for the evaluation of hypertension at the request of Dr. Alfredia Ferguson.  History of Present Illness:   [name] states he is feeling good today. He notes he has chronic headaches for several months now that are generally peri-occipital or tension-like. His headache seemed to worsen with Imdur.   Yesterday afternoon, Imdur discontinued by primary team. No overnight events noted. Blood pressure improved today to 143/65.   Inpatient Medications: Scheduled Meds:  amiodarone  200 mg Oral BID   amLODipine  10 mg Oral Daily   atorvastatin  80 mg Oral Daily   DULoxetine  60 mg Oral Daily   enoxaparin (LOVENOX) injection  120 mg Subcutaneous Q12H   gabapentin  200 mg Oral Daily   hydrALAZINE  50 mg Oral TID   insulin aspart  0-5 Units Subcutaneous QHS   insulin aspart  0-9 Units Subcutaneous TID WC   insulin aspart  2 Units Subcutaneous TID WC   levothyroxine  100 mcg Oral QAC breakfast   lisinopril  40 mg Oral Daily   mouth rinse  15 mL Mouth Rinse BID   metoprolol tartrate  50 mg Oral BID   mycophenolate  1,000 mg Oral Q12H   predniSONE  5 mg Oral Q breakfast   sodium chloride flush  3 mL Intravenous Q12H   tamsulosin  0.8 mg Oral QHS   warfarin  5 mg Oral ONCE-1600   Warfarin - Pharmacist Dosing Inpatient   Does not  apply q1600   Continuous Infusions:  PRN Meds: acetaminophen **OR** acetaminophen, ALPRAZolam, hydrALAZINE, labetalol, ondansetron **OR** ondansetron (ZOFRAN) IV, senna-docusate  Allergies:   No Known Allergies  Social History:   Social History   Socioeconomic History   Marital status: Married    Spouse name: Not on file   Number of children: 3   Years of education: Not on file   Highest education level: Not on file  Occupational History   Occupation: Training and development officer: [employer]  Tobacco Use   Smoking status: Former    Types: Cigars    Quit date: 12/06/2012    Years since quitting: 8.8   Smokeless tobacco: Never  Vaping Use   Vaping Use: Never used  Substance and Sexual Activity   Alcohol use: Yes    Comment: once/month   Drug use: No   Sexual activity: Not on file  Other Topics Concern   Not on file  Social History Narrative   Not on file   Social Determinants of Health   Financial Resource Strain: Not on file  Food Insecurity: Not on file  Transportation Needs: Not on file  Physical Activity: Not on file  Stress: Not on file  Social Connections: Not on file  Intimate Partner Violence: Not on file    Family History:   Family History  Problem Relation Age of Onset  Colon polyps Father    Colon cancer Neg Hx    Stomach cancer Neg Hx      ROS:  Negative except as noted above.   Physical Exam/Data:   Vitals:   10/23/21 1044 10/23/21 1216 10/23/21 1243 10/23/21 1355  BP: (!) 168/78 (!) 173/85 (!) 196/89   Pulse: 67 65 67   Resp: 18 18 20 20   Temp:  (!) 97.3 F (36.3 C)    TempSrc:  Oral    SpO2:      Weight:      Height:        Intake/Output Summary (Last 24 hours) at 10/23/2021 1430 Last data filed at 10/23/2021 0507 Gross per 24 hour  Intake 480 ml  Output 450 ml  Net 30 ml   Last 3 Weights 10/21/2021 10/18/2021 10/17/2021  Weight (lbs) 270 lb 287 lb 11.2 oz 292 lb 1.8 oz  Weight (kg) 122.471 kg 130.5 kg 132.5 kg     Body mass index is  37.66 kg/m.  General: 61 y.o. morbidly obese Caucasian male resting comfortably in no acute distress. HEENT: Normocephalic and atraumatic. Sclera clear.  Neck: Supple. No JVD.  Heart: RRR. Normal S1 and S2. Holosystolic murmur, 2/6.  Lungs: Minimal tachypnea noted, however no accessory muscle use. Saturating >95% on room air. Minimal fine crackles in the bases.   Abdomen: Soft, non-distended, and non-tender to palpation.  MSK: Normal strength and tone for age. Extremities: No edema. Skin: Warm and dry. Neuro: Alert and oriented x3. No focal deficits. Psych: Amxious. Responds appropriately.  EKG: No new ECGs  Telemetry:  Telemetry was personally reviewed and demonstrates: Sinus rhythm with no PVCs. Rates in the 80s.   Relevant CV Studies:  Echocardiogram 10/24/2021: Impressions: 1. Left ventricular ejection fraction, by estimation, is 60 to 65%. The  left ventricle has normal function. The left ventricle has no regional  wall motion abnormalities. There is moderate concentric left ventricular  hypertrophy.   2. Right ventricular systolic function is normal. The right ventricular  size is normal.   3. The mitral valve is degenerative.   4. Normal 27 mm Edwards Inspiris Resilia aortic prosthesis. Vmax 2.3 m/s,  MG 12 mmHG, EOA 2.14 cm2, DI 0.26. The aortic valve has been  repaired/replaced. Aortic valve regurgitation is not visualized. There is  a 27 mm Edwards INSPIRIS RESILIA pericardial   valve valve present in the aortic position. Procedure Date: 10/05/2021.   5. The inferior vena cava is dilated in size with >50% respiratory  variability, suggesting right atrial pressure of 8 mmHg.   Comparison(s): Changes from prior study are noted. AoV now replaced.  _______________  Right/Left Cardiac Catheterization 08/22/2021:   2nd Mrg lesion is 50% stenosed.   Lat 2nd Mrg lesion is 20% stenosed.   RPAV lesion is 80% stenosed.   Dist LM to Prox LAD lesion is 80% stenosed.   Prox  LAD to Mid LAD lesion is 90% stenosed.   LV end diastolic pressure is mildly elevated.  Diagnostic Dominance: Right   Laboratory Data:  High Sensitivity Troponin:   Recent Labs  Lab 10/21/21 1634 10/21/21 1811  TROPONINIHS 144* 154*     Chemistry Recent Labs  Lab 10/21/21 1634 10/22/21 0411 10/23/21 0227  NA 133* 137 134*  K 3.9 4.2 4.1  CL 105 105 106  CO2 21* 23 19*  GLUCOSE 214* 168* 224*  BUN 33* 31* 36*  CREATININE 2.03* 2.06* 2.17*  CALCIUM 9.1 9.5 9.4  MG  --  1.8 1.8  GFRNONAA 36* 36* 34*  ANIONGAP 7 9 9     Recent Labs  Lab 10/21/21 1634 10/23/21 0227  PROT 5.6* 5.3*  ALBUMIN 3.3* 3.0*  AST 18 14*  ALT 23 19  ALKPHOS 102 88  BILITOT 0.8 0.9   Lipids No results for input(s): CHOL, TRIG, HDL, LABVLDL, LDLCALC, CHOLHDL in the last 168 hours.  Hematology Recent Labs  Lab 10/21/21 1634 10/22/21 0411 10/23/21 0227  WBC 8.4 9.6 8.2  RBC 4.38 4.34 4.29  HGB 12.3* 12.5* 12.1*  HCT 38.7* 38.3* 37.5*  MCV 88.4 88.2 87.4  MCH 28.1 28.8 28.2  MCHC 31.8 32.6 32.3  RDW 14.5 14.6 14.6  PLT 376 338 284   Thyroid No results for input(s): TSH, FREET4 in the last 168 hours.  BNP Recent Labs  Lab 10/21/21 1634  BNP 324.0*    DDimer No results for input(s): DDIMER in the last 168 hours.  Radiology/Studies:  CT HEAD WO CONTRAST (5MM)  Result Date: 10/21/2021 CLINICAL DATA:  Headache, dizziness, weakness EXAM: CT HEAD WITHOUT CONTRAST TECHNIQUE: Contiguous axial images were obtained from the base of the skull through the vertex without intravenous contrast. RADIATION DOSE REDUCTION: This exam was performed according to the departmental dose-optimization program which includes automated exposure control, adjustment of the mA and/or kV according to patient size and/or use of iterative reconstruction technique. COMPARISON:  10/07/2021, 10/11/2021 FINDINGS: Brain: Hypodensities within the left frontal parietal periventricular white matter consistent with areas of  acute infarct seen on previous MRI. No new infarct or hemorrhage. Lateral ventricles and midline structures are unremarkable. No acute extra-axial fluid collections. No mass effect. Vascular: No hyperdense vessel or unexpected calcification. Stable atherosclerosis. Skull: Normal. Negative for fracture or focal lesion. Sinuses/Orbits: No acute finding. Other: None. IMPRESSION: 1. Subacute small vessel ischemic changes within the left frontal parietal white matter, corresponding to previous MRI findings. 2. No acute infarct or hemorrhage. Electronically Signed   By: Randa Ngo M.D.   On: 10/21/2021 19:11   DG Chest Portable 1 View  Result Date: 10/21/2021 CLINICAL DATA:  61 year old male with history of shortness of breath, dizziness and weakness. EXAM: PORTABLE CHEST 1 VIEW COMPARISON:  Chest x-ray 10/09/2021. FINDINGS: Bibasilar opacities may reflect areas of atelectasis and/or consolidation, with superimposed small bilateral pleural effusions. No pneumothorax. No evidence of pulmonary edema. Heart size is mildly enlarged. Upper mediastinal contours are within normal limits. Status post median sternotomy for aortic valve replacement with what appears to be a stented bioprosthesis. IMPRESSION: 1. Bibasilar areas of atelectasis and/or consolidation with superimposed small bilateral pleural effusions. Electronically Signed   By: Vinnie Langton M.D.   On: 10/21/2021 12:09    Assessment and Plan:   #Syncope #Hypertensive Urgency Blood pressure improved today and closer to goal.  - Continue Amlodipine 10mg  daily, Lisinopril 40mg  daily, Hydralazine 100 mg TID, Coreg 25 mg BID - Discontinue Imdur 2/2 headache - Start Doxazosin 2 mg daily  - Recommend outpatient follow up with Dr. Claiborne Billings for continued titration of anti-hypertensives.   #Dyspnea Improved and stable at this time. Suspect anxiety versus deconditioning.  - No further intervention indicated.   #Demand Ischemia  High-sensitivity troponin  144 >> 154. TTE stable with no RWMA. In the setting of hypertensive urgency.  - No additional ischemic work-up necessary at this time.  #CAD s/p Recent CABG S/p recent CABG with LIMA to LAD on 10/05/2021. Sternotomy incision healing well.  - No aspirin due to need for Coumadin. - Continue beta-blocker  and high-intensity statin  #Severe Aortic Stenosis s/p Recent AVR S/p bioprosthetic AVR on 10/05/2021. - On Coumadin given post-op atrial fibrillation and CVA.  #Post-Op Atrial Fibrillation Following recent CABG/AVR. Converted with Amiodarone. Remains in sinus rhythm at this time.  - Continue Amiodarone 200mg  twice daily.  - Continue Coreg 25 mg BID - Continue Coumadin per pharmacy dosing.   #CKD Stage IIIb #S/p Renal Transplant Patient has a history of a nephrectomy at age 59 for congential nonfunctional left kidney and subsequent ESRD previously on dialysis and then renal transplant in 2018. Baseline creatinine around 2.0 to 2.3. Creatinine improved today to 2.39.   - Encourage regular PO water intake  - On Cellcept, Belatacept, and Prednisone. - Followed at [facility] as an outpatient.   #Obstructive Sleep Apnea - Continue BiPAP at night.   # Anxiety Disorder - Management per primary   #Embolic CVA - Continue Coumadin as above.  #Hyperlipidemia - Continue home Lipitor 80mg  daily.  #Type 2 Diabetes Mellitus - Management per primary team.  Risk Assessment/Risk Scores:   CHA2DS2-VASc Score = 5  This indicates a 7.2% annual risk of stroke. The patient's score is based upon: CHF History: 0 HTN History: 1 Diabetes History: 1 Stroke History: 2 Vascular Disease History: 1 Age Score: 0 Gender Score: 0  For questions or updates, please contact [facility] Please consult [email or web address] for contact info under   Signed, Dr. Jose Persia Internal Medicine PGY-3  10/26/2021, 9:39 AM  Patient seen and examined and agree with with Jose Persia, MD as detailed above.   In  brief, the patient is a 61 y.o. male with a history of CAD s/p recent CABG x1 (LIMA to LAD) on 10/05/2021, severe aortic stenosis s/p recent AVR on 10/05/2021, paroxysmal SVT, hypertension, hyperlipidemia, type 2 diabetes mellitus, s/p nephrectomy at age 46 for congential nonfunctional left kidney and subsequent ESRD previously on dialysis and then renal transplant in 2018, obstructive sleep apnea, and obesity who presented to the ER with dizziness and weakness found to have severe HTN for which Cardiology has been consulted.   Patient had recent CABG AVR on 10/05/21 with post-op course complicated by Afib requiring amiodarone, AKI on CKD, and perioperative CVA with multiple embolic lesions on MRI. He was initiated on warfarin and discharged to SNF. At rehab, he has been having episodes of dizziness/lightheadedness where his BP is markedly elevated to the 200s. Has had a history of HTN prior to surgery but has never run this high. He denies chest pain but has been having ongoing short windedness. Recent INRs subtherapeutic.    Here, TTE 10/24/21 with EF 60-65%, normal functioning AoV prosthesis with MG 31mmHg, normal RV, RAP 64mmHg. V/Q scan negative for PE. Blood pressures improving but not at goal. Imdur stopped due to HA.   GEN: Comfortable Neck: JVD difficult to assess due to body habitus Cardiac: RRR, 2/6 systolic murmur Respiratory: Diminished at the bases but otherwise clear GI: Obese, soft, ND MS: Minimal edema; warm Neuro:  Nonfocal  Psych: Calm    Plan: -TTE with normal functioning AoV, normal EF, normal RV -V/Q scan negative for PE -Continue amlodipine 10mg  daily -Continue lisinopril 40mg  daily (may need to hold pending Cr) -Continue coreg 25mg  BID -Continue hydralazine 100mg  TID -Did not tolerate imdur due to HA -Start doxazosin 2mg  daily -Warfarin per pharmacy -Continue amiodarone 200mg  BID -S/p renal transplant; on immunosuppresion -Management of anxiety per primary -Would  benefit from following in HTN clinic  Cardiology will sign-off . Will arrange  for CV follow-up.   Gwyndolyn Kaufman, MD

## 2021-10-26 NOTE — Progress Notes (Signed)
ANTICOAGULATION CONSULT NOTE  Pharmacy Consult for enoxaparin>warfarin Indication: atrial fibrillation  No Known Allergies  Patient Measurements: Height: 5\' 11"  (180.3 cm) Weight: 128.1 kg (282 lb 6.6 oz) IBW/kg (Calculated) : 75.3  Vital Signs: Temp: 97.6 F (36.4 C) (01/19 0500) Temp Source: Oral (01/19 0500) BP: 143/65 (01/19 0500) Pulse Rate: 72 (01/19 0500)  Labs: Recent Labs    10/24/21 0305 10/24/21 0811 10/25/21 0309 10/26/21 0208  HGB 11.9*  --  11.7*  --   HCT 36.9*  --  36.6*  --   PLT 268  --  262  --   LABPROT  --  18.7* 21.1* 25.8*  INR  --  1.6* 1.8* 2.4*  CREATININE 2.12* 2.04* 2.46* 2.39*     Estimated Creatinine Clearance: 43.7 mL/min (A) (by C-G formula based on SCr of 2.39 mg/dL (H)).   Medical History: Past Medical History:  Diagnosis Date   Anemia    low hgb at present   Arthritis    FEET   Blood transfusion without reported diagnosis 1974   kidney removed - L   Cancer ([place])    skin ca right shoulder, plastic dsyplasia, pre-Ca polpys removed on Colonoscopy- 07/2014   Colon polyps 07/23/2014   Tubular adenomas x 5   Constipation    Coronary artery disease    Diabetes mellitus without complication ([place])    Type 2   Difficult intubation    unsure of actual problem but it was during the January 28, 2015 procedure.   Dysrhythmia    Eczema    ESRD (end stage renal disease) ([place])    hemodialysis 06/2014-02/2017, s/p living unrelated kidney transplant 02/26/17   Headache(784.0)    slight dull h/a due kidney failure   Heart murmur    aortic stenosis (moderate-severe 04/2021)   Hyperlipidemia    Hypertension    SVT h/o , followed by Dr. Claiborne Billings   Hypothyroidism    Macular degeneration    Neuropathy    right hand and both feet   PAT (paroxysmal atrial tachycardia) ([facility])    PSVT (paroxysmal supraventricular tachycardia) ([facility])    SBO (small bowel obstruction) ([place]) 05/2015   Shortness of breath    with exertion   Sleep apnea 06/18/2011    split-night sleep study- [facility]., BiPAP- 13-16   Thyroid disease    Umbilical hernia    s/p repair 04/28/15   Assessment: 10 yom s/p CABG and AVR 2 weeks ago. Warfarin per pharmacy consult placed for AFib.  Home dose of warfarin is 2.5 mg daily. Patient last taken 1/13 at 1800.  INR up today to 2.4. CBC stable. No bleeding issues noted. Lovenox bridge started 1/15 d/t patient's history of embolic stroke can stop now   Goal of Therapy:  INR 2-2.5 (per 1/6 note by MD Bartle) Monitor platelets by anticoagulation protocol: Yes   Plan:  Home today on warfarin 2.5 mg   Thank you, Erin Hearing PharmD., BCPS Clinical Pharmacist 10/26/2021 11:07 AM

## 2021-10-26 NOTE — TOC Transition Note (Signed)
Transition of Care [facility]) - CM/SW Discharge Note   Patient Details  Name: ARMONTE TORTORELLA MRN: [id number] Date of Birth: 08-16-1961  [facility] CM/SW Contact:  Bethann Berkshire, [place] Phone Number: 10/26/2021, 11:34 AM   Clinical Narrative:     [place] notified TOC that auth was approved. Pt will use CPAP from home when at SNF.  Patient will DC to: [facility] Anticipated DC date: 10/26/21 Family notified: Hershel, Corkery (Spouse)  [phone] [facility] Phone) Transport by: Corey Harold   Per MD patient ready for DC to [facility]. RN, patient, patient's family, and facility notified of DC. Discharge Summary and FL2 sent to facility. RN to call report prior to discharge ([phone] Room 122). DC packet on chart. Ambulance transport requested for patient.   CSW will sign off for now as social work intervention is no longer needed. Please consult [place] again if new needs arise.   Final next level of care: Skilled Nursing Facility Barriers to Discharge: No Barriers Identified   Patient Goals and CMS Choice Patient states their goals for this hospitalization and ongoing recovery are:: To get stronger      Discharge Placement              Patient chooses bed at: [facility] Patient to be transferred to facility by: [place] Name of family member notified: Axtyn, Woehler (Spouse)   [phone] [facility] Phone) Patient and family notified of of transfer: 10/26/21  Discharge Plan and Services                                     Social Determinants of Health (SDOH) Interventions     Readmission Risk Interventions No flowsheet data found.

## 2021-10-26 NOTE — Discharge Summary (Signed)
Physician Discharge Summary  Robert [name] HKV:[id number] DOB: September 16, 1961 DOA: 10/21/2021  PCP: Haywood Pao, MD  Admit date: 10/21/2021 Discharge date: 10/26/2021  Admitted From: [facility] Discharge disposition: Back to [facility]   Code Status: Full Code   Discharge Diagnosis:   Principal Problem:   Hypertensive urgency Active Problems:   Diabetes mellitus type 2 in obese ([place])   Severe obstructive sleep apnea   Hypothyroidism   Stage 3b chronic kidney disease (CKD) ([place])   Immunosuppressed status ([place])   Aortic valve stenosis   S/P AVR (aortic valve replacement)   Cerebrovascular accident (CVA) due to embolism of precerebral artery [facility])    Chief Complaint  Patient presents with   Weakness   Brief narrative: Robert [name] is a 61 y.o. male with PMH significant for renal transplant status, CKD 3B, obesity, sleep apnea, DM2, HTN, HLD, CAD, fever aortic stenosis status post CABG and AVR on 10/05/2021, postoperative A. fib, embolic CVA currently on Coumadin. Patient was brought to the ED from SNF on 10/21/2021 for dizziness, nausea, elevated blood pressure. In the ED blood pressure was significantly elevated to 204/119. Chest x-ray with bibasilar disease. CT head with subacute dismissal ischemic changes Admitted to hospitalist service  Subjective: Patient was seen and examined this morning.  Pleasant middle-aged obese patient.  Lying on bed.  His biggest complaint is intermittent episodes of anxiety.  He has felt benefit with as needed Xanax in the hospital. Chart reviewed Blood pressure improving and at 143/65 this morning  Assessment/Plan: Hypertensive urgency -Presented with multiple intermittent dizziness and nausea in the context of severely elevated blood pressures.  Cardiology consult appreciated. -Currently blood pressure is controlled on amlodipine 10 mg daily, lisinopril 40 mg daily, carvedilol 25 twice daily, hydralazine 100 mg 3  times daily, Imdur 90 mg daily, Cardura 2 mg daily.  Discharge on the same regimen.  CAD status post CABG Aortic stenosis status post AVR -Fairly stable.  Normal ejection fraction.    Paroxysmal A. Fib -Currently sinus rhythm on carvedilol and amiodarone.   -Continue anticoagulation with Coumadin.   -INR 2.4 today, pharmacy dosing Coumadin.  Bridging with Lovenox. Recent Labs  Lab 10/22/21 0411 10/23/21 0227 10/24/21 0811 10/25/21 0309 10/26/21 0208  INR 1.3* 1.5* 1.6* 1.8* 2.4*   Recent stroke -Suffered embolic a stroke in the setting of postop A. fib,  -currently on warfarin and Lipitor.   Renal transplant status  CKD stage IIIb transplanted kidney  -Serum creatinine baseline.  Continue to monitor -Continue CellCept and prednisone. Recent Labs    10/11/21 0550 10/13/21 0655 10/15/21 0201 10/21/21 1634 10/22/21 0411 10/23/21 0227 10/24/21 0305 10/24/21 0811 10/25/21 0309 10/26/21 0208  BUN 45* 44* 51* 33* 31* 36* 37* 34* 40* 47*  CREATININE 2.12* 2.28* 2.14* 2.03* 2.06* 2.17* 2.12* 2.04* 2.46* 2.39*   Type 2 diabetes mellitus -A1c 7.1 on 09/2021 -Prior to admission, patient was on Lantus 38 units twice daily and Premeal sliding scale insulin. -For the last 5 days in the hospital, patient is not requiring any long-acting insulin and is only on sliding scale insulin with blood sugar level mostly controlled under 200. -At discharge, I would resume him on Lantus at a lower dose of 10 units and continue sliding scale insulin.  Adjust insulin at the facility based on his requirement. Recent Labs  Lab 10/25/21 1109 10/25/21 1322 10/25/21 1718 10/25/21 2034 10/26/21 0820  GLUCAP 174* 198* 192* 211* 170*   Anxiety with panic attacks -Continue  duloxetine, alprazolam   Hypothyroidism -Continue Synthroid.    Obesity -Body mass index is 39.39 kg/m. Patient has been advised to make an attempt to improve diet and exercise patterns to aid in weight loss.    Obstructive sleep apnea -Nightly CPAP  BPH -Flomax   Discharge Medications:   Allergies as of 10/26/2021   No Known Allergies      Medication List     STOP taking these medications    hydrALAZINE 25 MG tablet Commonly known as: APRESOLINE   insulin detemir 100 UNIT/ML injection Commonly known as: LEVEMIR   lactulose 10 GM/15ML solution Commonly known as: CHRONULAC   metoprolol tartrate 50 MG tablet Commonly known as: LOPRESSOR   oxyCODONE 5 MG immediate release tablet Commonly known as: Oxy IR/ROXICODONE       TAKE these medications    acetaminophen 325 MG tablet Commonly known as: TYLENOL Take 2 tablets (650 mg total) by mouth every 4 (four) hours as needed for moderate pain.   ALPRAZolam 0.25 MG tablet Commonly known as: XANAX Take 1 tablet (0.25 mg total) by mouth 3 (three) times daily as needed for up to 5 days for anxiety or sleep.   amiodarone 200 MG tablet Commonly known as: PACERONE Take 1 tablet (200 mg total) by mouth 2 (two) times daily.   amLODipine 10 MG tablet Commonly known as: NORVASC TAKE 1 TABLET BY MOUTH EVERY DAY   atorvastatin 80 MG tablet Commonly known as: LIPITOR Take 1 tablet (80 mg total) by mouth daily.   carvedilol 25 MG tablet Commonly known as: COREG Take 1 tablet (25 mg total) by mouth 2 (two) times daily with a meal.   cholecalciferol 25 MCG (1000 UNIT) tablet Commonly known as: VITAMIN D3 Take 1,000 Units by mouth in the morning and at bedtime.   doxazosin 2 MG tablet Commonly known as: CARDURA Take 1 tablet (2 mg total) by mouth daily.   DULoxetine 60 MG capsule Commonly known as: CYMBALTA Take 60 mg by mouth daily.   gabapentin 100 MG capsule Commonly known as: NEURONTIN Take 200 mg by mouth daily.   insulin aspart 100 UNIT/ML injection Commonly known as: novoLOG Inject 0-9 Units into the skin 3 (three) times daily with meals. What changed: additional instructions   insulin glargine 100 unit/mL  Sopn Commonly known as: LANTUS Inject 10 Units into the skin daily. What changed:  how much to take when to take this   levothyroxine 100 MCG tablet Commonly known as: SYNTHROID TAKE 1 TABLET BY MOUTH EVERY DAY What changed: when to take this   lisinopril 40 MG tablet Commonly known as: ZESTRIL Take 1 tablet (40 mg total) by mouth daily.   mycophenolate 250 MG capsule Commonly known as: CELLCEPT Take 1,000 mg by mouth every 12 (twelve) hours.   Nulojix 250 MG Solr injection Generic drug: belatacept Inject 24.5 mLs into the vein every 28 (twenty-eight) days.   predniSONE 5 MG tablet Commonly known as: DELTASONE Take 5 mg by mouth daily with breakfast.   senna-docusate 8.6-50 MG tablet Commonly known as: Senokot-S Take 1 tablet by mouth at bedtime as needed for mild constipation.   tamsulosin 0.4 MG Caps capsule Commonly known as: FLOMAX Take 0.8 mg by mouth at bedtime.   testosterone cypionate 200 MG/ML injection Commonly known as: DEPOTESTOSTERONE CYPIONATE Inject 0.6 mLs (120 mg total) into the muscle every 7 (seven) days.   warfarin 2.5 MG tablet Commonly known as: COUMADIN Take 1 tablet (2.5 mg total) by mouth  daily at 4 PM.               Discharge Care Instructions  (From admission, onward)           Start     Ordered   10/26/21 0000  Discharge wound care:        10/26/21 1032            Wound care:   Incision (Closed) 10/05/21 Chest Other (Comment) (Active)  Date First Assessed/Time First Assessed: 10/05/21 0748   Location: Chest  Location Orientation: Other (Comment)    Assessments 10/05/2021  3:00 PM 10/26/2021  8:55 AM  Dressing Type Gauze (Comment) None  Dressing Clean;Dry;Intact --  Dressing Change Frequency Daily --  Site / Wound Assessment Dressing in place / Unable to assess Clean;Dry  Margins -- Attached edges (approximated)     No Linked orders to display    Discharge Instructions:   Discharge Instructions     Call  MD for:   Complete by: As directed    Call MD for:  difficulty breathing, headache or visual disturbances   Complete by: As directed    Call MD for:  extreme fatigue   Complete by: As directed    Call MD for:  hives   Complete by: As directed    Call MD for:  persistant dizziness or light-headedness   Complete by: As directed    Call MD for:  persistant nausea and vomiting   Complete by: As directed    Call MD for:  severe uncontrolled pain   Complete by: As directed    Call MD for:  temperature >100.4   Complete by: As directed    Diet general   Complete by: As directed    Discharge instructions   Complete by: As directed    Discharge instructions for diabetes mellitus: Check blood sugar 3 times a day and bedtime at home. If blood sugar running above 200 or less than 70 please call your MD to adjust insulin. If you notice signs and symptoms of hypoglycemia (low blood sugar) like jitteriness, confusion, thirst, tremor and sweating, please check blood sugar, drink sugary drink/biscuits/sweets to increase sugar level and call MD or return to ER.    Discharge instructions for CHF Check weight daily -preferably same time every day. Restrict fluid intake to 1200 ml daily Restrict salt intake to less than 2 g daily. Call MD if you have one of the following symptoms 1) 3 pound weight gain in 24 hours or 5 pounds in 1 week  2) swelling in the hands, feet or stomach  3) progressive shortness of breath 4) if you have to sleep on extra pillows at night in order to breathe     General discharge instructions:  Follow with Primary MD [name], [name], MD in 7 days   Get CBC/BMP checked in next visit within 1 week by PCP or SNF MD. (We routinely change or add medications that can affect your baseline labs and fluid status, therefore we recommend that you get the mentioned basic workup next visit with your PCP, your PCP may decide not to get them or add new tests based on their  clinical decision)  On your next visit with your PCP, please get your medicines reviewed and adjusted.  Please request your PCP  to go over all hospital tests, procedures, radiology results at the follow up, please get all Hospital records sent to your PCP by signing hospital release before you  go home.  Activity: As tolerated with Full fall precautions use walker/cane & assistance as needed  Avoid using any recreational substances like cigarette, tobacco, alcohol, or non-prescribed drug.  If you experience worsening of your admission symptoms, develop shortness of breath, life threatening emergency, suicidal or homicidal thoughts you must seek medical attention immediately by calling 911 or calling your MD immediately  if symptoms less severe.  You must read complete instructions/literature along with all the possible adverse reactions/side effects for all the medicines you take and that have been prescribed to you. Take any new medicine only after you have completely understood and accepted all the possible adverse reactions/side effects.   Do not drive, operate heavy machinery, perform activities at heights, swimming or participation in water activities or provide baby sitting services if your were admitted for syncope or siezures until you have seen by Primary MD or a Neurologist and advised to do so again.  Do not drive when taking Pain medications.  Do not take more than prescribed Pain, Sleep and Anxiety Medications  Wear Seat belts while driving.  Please note You were cared for by a hospitalist during your hospital stay. If you have any questions about your discharge medications or the care you received while you were in the hospital after you are discharged, you can call the unit and asked to speak with the hospitalist on call if the hospitalist that took care of you is not available. Once you are discharged, your primary care physician will handle any further medical issues. Please  note that NO REFILLS for any discharge medications will be authorized once you are discharged, as it is imperative that you return to your primary care physician (or establish a relationship with a primary care physician if you do not have one) for your aftercare needs so that they can reassess your need for medications and monitor your lab values.   Discharge wound care:   Complete by: As directed    Increase activity slowly   Complete by: As directed        Follow ups:    Follow-up Information     [name], [name], MD Follow up.   Specialty: Internal Medicine Contact information: [address] [place] [address] [phone]         Troy Sine, MD .   Specialty: Cardiology Contact information: [address] [place] [address] [phone]                 Discharge Exam:   Vitals:   10/25/21 1500 10/25/21 1735 10/25/21 1948 10/26/21 0500  BP: (!) 143/74 (!) 148/66 (!) 124/53 (!) 143/65  Pulse: 69 74 71 72  Resp:   20 18  Temp:   98.5 F (36.9 C) 97.6 F (36.4 C)  TempSrc:   Axillary Oral  SpO2: 95%  93% 95%  Weight:    128.1 kg  Height:        Body mass index is 39.39 kg/m.  General exam: Pleasant, obese, middle-aged Caucasian male.  Not in physical distress, Skin: No rashes, lesions or ulcers. HEENT: Atraumatic, normocephalic, no obvious bleeding Lungs: Clear to auscultation bilaterally CVS: Regular rate and rhythm, no murmur GI/Abd soft, nontender, nondistended, bowel sound present CNS: Alert, awake, oriented x3 Psychiatry: Anxious Extremities: No pedal edema, no calf tenderness  Time coordinating discharge: 35 minutes   The results of significant diagnostics from this hospitalization (including imaging, microbiology, ancillary and laboratory) are listed below for reference.  Procedures and Diagnostic Studies:   CT HEAD WO CONTRAST (5MM)  Result Date: 10/21/2021 CLINICAL DATA:  Headache, dizziness,  weakness EXAM: CT HEAD WITHOUT CONTRAST TECHNIQUE: Contiguous axial images were obtained from the base of the skull through the vertex without intravenous contrast. RADIATION DOSE REDUCTION: This exam was performed according to the departmental dose-optimization program which includes automated exposure control, adjustment of the mA and/or kV according to patient size and/or use of iterative reconstruction technique. COMPARISON:  10/07/2021, 10/11/2021 FINDINGS: Brain: Hypodensities within the left frontal parietal periventricular white matter consistent with areas of acute infarct seen on previous MRI. No new infarct or hemorrhage. Lateral ventricles and midline structures are unremarkable. No acute extra-axial fluid collections. No mass effect. Vascular: No hyperdense vessel or unexpected calcification. Stable atherosclerosis. Skull: Normal. Negative for fracture or focal lesion. Sinuses/Orbits: No acute finding. Other: None. IMPRESSION: 1. Subacute small vessel ischemic changes within the left frontal parietal white matter, corresponding to previous MRI findings. 2. No acute infarct or hemorrhage. Electronically Signed   By: Randa Ngo M.D.   On: 10/21/2021 19:11   DG Chest Portable 1 View  Result Date: 10/21/2021 CLINICAL DATA:  61 year old male with history of shortness of breath, dizziness and weakness. EXAM: PORTABLE CHEST 1 VIEW COMPARISON:  Chest x-ray 10/09/2021. FINDINGS: Bibasilar opacities may reflect areas of atelectasis and/or consolidation, with superimposed small bilateral pleural effusions. No pneumothorax. No evidence of pulmonary edema. Heart size is mildly enlarged. Upper mediastinal contours are within normal limits. Status post median sternotomy for aortic valve replacement with what appears to be a stented bioprosthesis. IMPRESSION: 1. Bibasilar areas of atelectasis and/or consolidation with superimposed small bilateral pleural effusions. Electronically Signed   By: Vinnie Langton  M.D.   On: 10/21/2021 12:09     Labs:   Basic Metabolic Panel: Recent Labs  Lab 10/22/21 0411 10/23/21 0227 10/24/21 0305 10/24/21 0811 10/25/21 0309 10/26/21 0208  NA 137 134* 135 133* 135 133*  K 4.2 4.1 4.2 4.2 4.6 4.3  CL 105 106 106 105 106 105  CO2 23 19* 20* 20* 20* 20*  GLUCOSE 168* 224* 174* 175* 172* 174*  BUN 31* 36* 37* 34* 40* 47*  CREATININE 2.06* 2.17* 2.12* 2.04* 2.46* 2.39*  CALCIUM 9.5 9.4 9.6 9.7 9.5 9.6  MG 1.8 1.8 1.8  --  1.9  --   PHOS  --  3.1 3.2  --  3.5  --    GFR Estimated Creatinine Clearance: 43.7 mL/min (A) (by C-G formula based on SCr of 2.39 mg/dL (H)). Liver Function Tests: Recent Labs  Lab 10/21/21 1634 10/23/21 0227 10/24/21 0305 10/25/21 0309  AST 18 14* 13* 11*  ALT 23 19 17 17   ALKPHOS 102 88 90 85  BILITOT 0.8 0.9 0.8 0.6  PROT 5.6* 5.3* 5.2* 5.1*  ALBUMIN 3.3* 3.0* 3.1* 3.1*   No results for input(s): LIPASE, AMYLASE in the last 168 hours. No results for input(s): AMMONIA in the last 168 hours. Coagulation profile Recent Labs  Lab 10/22/21 0411 10/23/21 0227 10/24/21 0811 10/25/21 0309 10/26/21 0208  INR 1.3* 1.5* 1.6* 1.8* 2.4*    CBC: Recent Labs  Lab 10/21/21 1634 10/22/21 0411 10/23/21 0227 10/24/21 0305 10/25/21 0309  WBC 8.4 9.6 8.2 6.6 6.8  NEUTROABS 6.7  --  6.4 4.9 5.1  HGB 12.3* 12.5* 12.1* 11.9* 11.7*  HCT 38.7* 38.3* 37.5* 36.9* 36.6*  MCV 88.4 88.2 87.4 87.4 89.1  PLT 376 338 284 268 262   Cardiac Enzymes: No  results for input(s): CKTOTAL, CKMB, CKMBINDEX, TROPONINI in the last 168 hours. BNP: Invalid input(s): POCBNP CBG: Recent Labs  Lab 10/25/21 1109 10/25/21 1322 10/25/21 1718 10/25/21 2034 10/26/21 0820  GLUCAP 174* 198* 192* 211* 170*   D-Dimer No results for input(s): DDIMER in the last 72 hours. Hgb A1c No results for input(s): HGBA1C in the last 72 hours. Lipid Profile No results for input(s): CHOL, HDL, LDLCALC, TRIG, CHOLHDL, LDLDIRECT in the last 72  hours. Thyroid function studies No results for input(s): TSH, T4TOTAL, T3FREE, THYROIDAB in the last 72 hours.  Invalid input(s): FREET3 Anemia work up No results for input(s): VITAMINB12, FOLATE, FERRITIN, TIBC, IRON, RETICCTPCT in the last 72 hours. Microbiology Recent Results (from the past 240 hour(s))  Resp Panel by RT-PCR (Flu A&B, Covid) Nasopharyngeal Swab     Status: None   Collection Time: 10/18/21 12:00 PM   Specimen: Nasopharyngeal Swab; Nasopharyngeal(NP) swabs in vial transport medium  Result Value Ref Range Status   SARS Coronavirus 2 by RT PCR NEGATIVE NEGATIVE Final    Comment: (NOTE) SARS-CoV-2 target nucleic acids are NOT DETECTED.  The SARS-CoV-2 RNA is generally detectable in upper respiratory specimens during the acute phase of infection. The lowest concentration of SARS-CoV-2 viral copies this assay can detect is 138 copies/mL. A negative result does not preclude SARS-Cov-2 infection and should not be used as the sole basis for treatment or other patient management decisions. A negative result may occur with  improper specimen collection/handling, submission of specimen other than nasopharyngeal swab, presence of viral mutation(s) within the areas targeted by this assay, and inadequate number of viral copies(<138 copies/mL). A negative result must be combined with clinical observations, patient history, and epidemiological information. The expected result is Negative.  Fact Sheet for Patients:  EntrepreneurPulse.com.au  Fact Sheet for Healthcare Providers:  IncredibleEmployment.be  This test is no t yet approved or cleared by the [place] FDA and  has been authorized for detection and/or diagnosis of SARS-CoV-2 by FDA under an Emergency Use Authorization (EUA). This EUA will remain  in effect (meaning this test can be used) for the duration of the COVID-19 declaration under Section 564(b)(1) of the Act,  21 U.S.C.section 360bbb-3(b)(1), unless the authorization is terminated  or revoked sooner.       Influenza A by PCR NEGATIVE NEGATIVE Final   Influenza B by PCR NEGATIVE NEGATIVE Final    Comment: (NOTE) The Xpert Xpress SARS-CoV-2/FLU/RSV plus assay is intended as an aid in the diagnosis of influenza from Nasopharyngeal swab specimens and should not be used as a sole basis for treatment. Nasal washings and aspirates are unacceptable for Xpert Xpress SARS-CoV-2/FLU/RSV testing.  Fact Sheet for Patients: EntrepreneurPulse.com.au  Fact Sheet for Healthcare Providers: IncredibleEmployment.be  This test is not yet approved or cleared by the [place] FDA and has been authorized for detection and/or diagnosis of SARS-CoV-2 by FDA under an Emergency Use Authorization (EUA). This EUA will remain in effect (meaning this test can be used) for the duration of the COVID-19 declaration under Section 564(b)(1) of the Act, 21 U.S.C. section 360bbb-3(b)(1), unless the authorization is terminated or revoked.  Performed at [facility], [place] [address], [place], [place] [address]   Resp Panel by RT-PCR (Flu A&B, Covid) Nasopharyngeal Swab     Status: None   Collection Time: 10/21/21 10:39 PM   Specimen: Nasopharyngeal Swab; Nasopharyngeal(NP) swabs in vial transport medium  Result Value Ref Range Status   SARS Coronavirus 2 by RT PCR  NEGATIVE NEGATIVE Final    Comment: (NOTE) SARS-CoV-2 target nucleic acids are NOT DETECTED.  The SARS-CoV-2 RNA is generally detectable in upper respiratory specimens during the acute phase of infection. The lowest concentration of SARS-CoV-2 viral copies this assay can detect is 138 copies/mL. A negative result does not preclude SARS-Cov-2 infection and should not be used as the sole basis for treatment or other patient management decisions. A negative result may occur with  improper specimen  collection/handling, submission of specimen other than nasopharyngeal swab, presence of viral mutation(s) within the areas targeted by this assay, and inadequate number of viral copies(<138 copies/mL). A negative result must be combined with clinical observations, patient history, and epidemiological information. The expected result is Negative.  Fact Sheet for Patients:  EntrepreneurPulse.com.au  Fact Sheet for Healthcare Providers:  IncredibleEmployment.be  This test is no t yet approved or cleared by the [place] FDA and  has been authorized for detection and/or diagnosis of SARS-CoV-2 by FDA under an Emergency Use Authorization (EUA). This EUA will remain  in effect (meaning this test can be used) for the duration of the COVID-19 declaration under Section 564(b)(1) of the Act, 21 U.S.C.section 360bbb-3(b)(1), unless the authorization is terminated  or revoked sooner.       Influenza A by PCR NEGATIVE NEGATIVE Final   Influenza B by PCR NEGATIVE NEGATIVE Final    Comment: (NOTE) The Xpert Xpress SARS-CoV-2/FLU/RSV plus assay is intended as an aid in the diagnosis of influenza from Nasopharyngeal swab specimens and should not be used as a sole basis for treatment. Nasal washings and aspirates are unacceptable for Xpert Xpress SARS-CoV-2/FLU/RSV testing.  Fact Sheet for Patients: EntrepreneurPulse.com.au  Fact Sheet for Healthcare Providers: IncredibleEmployment.be  This test is not yet approved or cleared by the [place] FDA and has been authorized for detection and/or diagnosis of SARS-CoV-2 by FDA under an Emergency Use Authorization (EUA). This EUA will remain in effect (meaning this test can be used) for the duration of the COVID-19 declaration under Section 564(b)(1) of the Act, 21 U.S.C. section 360bbb-3(b)(1), unless the authorization is terminated or revoked.  Performed at [facility], [place] [address], [place], [place] [address]   Resp Panel by RT-PCR (Flu A&B, Covid) Nasopharyngeal Swab     Status: None   Collection Time: 10/25/21  1:11 PM   Specimen: Nasopharyngeal Swab; Nasopharyngeal(NP) swabs in vial transport medium  Result Value Ref Range Status   SARS Coronavirus 2 by RT PCR NEGATIVE NEGATIVE Final    Comment: (NOTE) SARS-CoV-2 target nucleic acids are NOT DETECTED.  The SARS-CoV-2 RNA is generally detectable in upper respiratory specimens during the acute phase of infection. The lowest concentration of SARS-CoV-2 viral copies this assay can detect is 138 copies/mL. A negative result does not preclude SARS-Cov-2 infection and should not be used as the sole basis for treatment or other patient management decisions. A negative result may occur with  improper specimen collection/handling, submission of specimen other than nasopharyngeal swab, presence of viral mutation(s) within the areas targeted by this assay, and inadequate number of viral copies(<138 copies/mL). A negative result must be combined with clinical observations, patient history, and epidemiological information. The expected result is Negative.  Fact Sheet for Patients:  EntrepreneurPulse.com.au  Fact Sheet for Healthcare Providers:  IncredibleEmployment.be  This test is no t yet approved or cleared by the [place] FDA and  has been authorized for detection and/or diagnosis of SARS-CoV-2 by FDA under an Emergency Use Authorization (EUA).  This EUA will remain  in effect (meaning this test can be used) for the duration of the COVID-19 declaration under Section 564(b)(1) of the Act, 21 U.S.C.section 360bbb-3(b)(1), unless the authorization is terminated  or revoked sooner.       Influenza A by PCR NEGATIVE NEGATIVE Final   Influenza B by PCR NEGATIVE NEGATIVE Final    Comment: (NOTE) The Xpert Xpress SARS-CoV-2/FLU/RSV plus assay is  intended as an aid in the diagnosis of influenza from Nasopharyngeal swab specimens and should not be used as a sole basis for treatment. Nasal washings and aspirates are unacceptable for Xpert Xpress SARS-CoV-2/FLU/RSV testing.  Fact Sheet for Patients: EntrepreneurPulse.com.au  Fact Sheet for Healthcare Providers: IncredibleEmployment.be  This test is not yet approved or cleared by the [place] FDA and has been authorized for detection and/or diagnosis of SARS-CoV-2 by FDA under an Emergency Use Authorization (EUA). This EUA will remain in effect (meaning this test can be used) for the duration of the COVID-19 declaration under Section 564(b)(1) of the Act, 21 U.S.C. section 360bbb-3(b)(1), unless the authorization is terminated or revoked.  Performed at [facility], [place] [address] [place], [place] [address]      Signed: Terrilee Croak  [facility] 10/26/2021, 10:32 AM

## 2021-10-30 ENCOUNTER — Telehealth: Payer: Self-pay | Admitting: Cardiovascular Disease

## 2021-10-30 DIAGNOSIS — Z736 Limitation of activities due to disability: Secondary | ICD-10-CM

## 2021-10-30 NOTE — Telephone Encounter (Signed)
Patient just recently in hospital for hypertensive urgency   Need to review medications with NP  Is patient taking Hydralazine ?  Have blood pressures been running low, per patient he had some lower blood pressures Follow up appointment 2/8   Tried to call NP, no answer

## 2021-10-30 NOTE — Telephone Encounter (Signed)
Spoke with patient regarding appointment  Discussed with Dr Claiborne Billings and will reschedule for 2/8 Advised patient of date and time

## 2021-10-30 NOTE — Telephone Encounter (Signed)
[place], NP states the patient has been with them at [facility] for rehab. She states he tested positive for COVID, but he is stable. He has mild dizziness, but no fever or additional symptoms. She assumes dizziness may be partially due to anti-rejection medication. They plan on doing antiviral treatments and she is request further recommendations from Dr. Claiborne Billings. She states patient is also hypertensive.  STAT if patient feels like he/she is going to faint   Are you dizzy now?  No   Do you feel faint or have you passed out?  No   Do you have any other symptoms?  No   Have you checked your HR and BP (record if available)?  01/23: BP - 150's/90"s

## 2021-10-30 NOTE — Telephone Encounter (Signed)
Patient would like to know if his appointment next week can be be virtual, because he has covid. He says he tested positive today and the appointment will be the 10th day of covid.

## 2021-11-01 ENCOUNTER — Ambulatory Visit: Payer: 59 | Admitting: Neurology

## 2021-11-06 ENCOUNTER — Telehealth: Payer: Self-pay | Admitting: Cardiovascular Disease

## 2021-11-06 ENCOUNTER — Encounter: Payer: Self-pay | Admitting: Cardiovascular Disease

## 2021-11-06 NOTE — Telephone Encounter (Signed)
Patient called he is in a rehab center and his heart meds have been changed several times. After rest for two hours his BP is 172/62. He has a the names of the medication he is on, he wants a number that he can send a picture to, to send this list.  I asked him if he has Elizabeth he said he wasn't going to send it that way. If Dr. Claiborne Billings can please call Dr. Reesa Chew at [facility].  He is also going to call his kidney doctor and tell them the same.

## 2021-11-06 NOTE — Telephone Encounter (Signed)
[id number]

## 2021-11-08 ENCOUNTER — Ambulatory Visit: Payer: 59 | Admitting: Cardiovascular Disease

## 2021-11-08 ENCOUNTER — Emergency Department (HOSPITAL_COMMUNITY): Payer: 59

## 2021-11-08 ENCOUNTER — Inpatient Hospital Stay (HOSPITAL_COMMUNITY)
Admission: EM | Admit: 2021-11-08 | Discharge: 2021-11-16 | DRG: 699 | Disposition: A | Discharge status: Home Health | Payer: 59 | Attending: Internal Medicine | Admitting: Internal Medicine

## 2021-11-08 ENCOUNTER — Ambulatory Visit: Payer: 59 | Admitting: Surgery

## 2021-11-08 DIAGNOSIS — I129 Hypertensive chronic kidney disease with stage 1 through stage 4 chronic kidney disease, or unspecified chronic kidney disease: Secondary | ICD-10-CM | POA: Diagnosis present

## 2021-11-08 DIAGNOSIS — Z8616 Personal history of COVID-19: Secondary | ICD-10-CM

## 2021-11-08 DIAGNOSIS — Z85828 Personal history of other malignant neoplasm of skin: Secondary | ICD-10-CM

## 2021-11-08 DIAGNOSIS — Z79899 Other long term (current) drug therapy: Secondary | ICD-10-CM

## 2021-11-08 DIAGNOSIS — N3000 Acute cystitis without hematuria: Secondary | ICD-10-CM | POA: Diagnosis not present

## 2021-11-08 DIAGNOSIS — D631 Anemia in chronic kidney disease: Secondary | ICD-10-CM | POA: Diagnosis present

## 2021-11-08 DIAGNOSIS — E1165 Type 2 diabetes mellitus with hyperglycemia: Secondary | ICD-10-CM | POA: Diagnosis present

## 2021-11-08 DIAGNOSIS — N1832 Chronic kidney disease, stage 3b: Secondary | ICD-10-CM | POA: Diagnosis present

## 2021-11-08 DIAGNOSIS — Z7901 Long term (current) use of anticoagulants: Secondary | ICD-10-CM

## 2021-11-08 DIAGNOSIS — E039 Hypothyroidism, unspecified: Secondary | ICD-10-CM | POA: Diagnosis present

## 2021-11-08 DIAGNOSIS — Z794 Long term (current) use of insulin: Secondary | ICD-10-CM

## 2021-11-08 DIAGNOSIS — Z8673 Personal history of transient ischemic attack (TIA), and cerebral infarction without residual deficits: Secondary | ICD-10-CM

## 2021-11-08 DIAGNOSIS — E871 Hypo-osmolality and hyponatremia: Secondary | ICD-10-CM | POA: Diagnosis present

## 2021-11-08 DIAGNOSIS — Z6837 Body mass index (BMI) 37.0-37.9, adult: Secondary | ICD-10-CM

## 2021-11-08 DIAGNOSIS — Z951 Presence of aortocoronary bypass graft: Secondary | ICD-10-CM

## 2021-11-08 DIAGNOSIS — I10 Essential (primary) hypertension: Secondary | ICD-10-CM | POA: Diagnosis present

## 2021-11-08 DIAGNOSIS — N10 Acute tubulo-interstitial nephritis: Secondary | ICD-10-CM | POA: Diagnosis present

## 2021-11-08 DIAGNOSIS — Z8371 Family history of colonic polyps: Secondary | ICD-10-CM

## 2021-11-08 DIAGNOSIS — E860 Dehydration: Secondary | ICD-10-CM | POA: Diagnosis present

## 2021-11-08 DIAGNOSIS — E785 Hyperlipidemia, unspecified: Secondary | ICD-10-CM | POA: Diagnosis present

## 2021-11-08 DIAGNOSIS — N179 Acute kidney failure, unspecified: Secondary | ICD-10-CM | POA: Diagnosis present

## 2021-11-08 DIAGNOSIS — E1122 Type 2 diabetes mellitus with diabetic chronic kidney disease: Secondary | ICD-10-CM | POA: Diagnosis present

## 2021-11-08 DIAGNOSIS — B961 Klebsiella pneumoniae [K. pneumoniae] as the cause of diseases classified elsewhere: Secondary | ICD-10-CM | POA: Diagnosis present

## 2021-11-08 DIAGNOSIS — N189 Chronic kidney disease, unspecified: Secondary | ICD-10-CM | POA: Diagnosis present

## 2021-11-08 DIAGNOSIS — I6310 Cerebral infarction due to embolism of unspecified precerebral artery: Secondary | ICD-10-CM | POA: Diagnosis present

## 2021-11-08 DIAGNOSIS — Y830 Surgical operation with transplant of whole organ as the cause of abnormal reaction of the patient, or of later complication, without mention of misadventure at the time of the procedure: Secondary | ICD-10-CM | POA: Diagnosis present

## 2021-11-08 DIAGNOSIS — T8619 Other complication of kidney transplant: Secondary | ICD-10-CM | POA: Diagnosis not present

## 2021-11-08 DIAGNOSIS — Z952 Presence of prosthetic heart valve: Secondary | ICD-10-CM

## 2021-11-08 DIAGNOSIS — Z940 Kidney transplant status: Secondary | ICD-10-CM

## 2021-11-08 DIAGNOSIS — N390 Urinary tract infection, site not specified: Secondary | ICD-10-CM

## 2021-11-08 DIAGNOSIS — I2510 Atherosclerotic heart disease of native coronary artery without angina pectoris: Secondary | ICD-10-CM | POA: Diagnosis present

## 2021-11-08 DIAGNOSIS — G4733 Obstructive sleep apnea (adult) (pediatric): Secondary | ICD-10-CM | POA: Diagnosis present

## 2021-11-08 HISTORY — DX: Nonrheumatic aortic (valve) stenosis: I35.0

## 2021-11-08 LAB — CBG MONITORING, ED: Glucose-Capillary: 254 mg/dL — ABNORMAL HIGH (ref 70–99)

## 2021-11-08 MED ORDER — VANCOMYCIN HCL 2000 MG/400ML IV SOLN
2000.0000 mg | Freq: Once | INTRAVENOUS | Status: DC
  Filled 2021-11-08: qty 400

## 2021-11-08 MED ORDER — LACTATED RINGERS IV BOLUS: 1000.0000 mL | Freq: Once | INTRAVENOUS | Status: DC

## 2021-11-08 MED ORDER — PIPERACILLIN-TAZOBACTAM 3.375 G IVPB
3.3750 g | Freq: Once | INTRAVENOUS | Status: AC
  Administered 2021-11-09: 3.375 g via INTRAVENOUS
  Filled 2021-11-08: qty 50

## 2021-11-08 MED ORDER — HYDROMORPHONE HCL 1 MG/ML IJ SOLN
1.0000 mg | Freq: Once | INTRAMUSCULAR | Status: AC
  Administered 2021-11-09: 1 mg via INTRAVENOUS
  Filled 2021-11-08: qty 1

## 2021-11-08 MED ORDER — VANCOMYCIN HCL IN DEXTROSE 1-5 GM/200ML-% IV SOLN: 1000.0000 mg | Freq: Once | INTRAVENOUS | Status: DC

## 2021-11-08 NOTE — ED Provider Notes (Signed)
[facility] EMERGENCY DEPARTMENT Provider Note   CSN: [id number] Arrival date & time: 11/08/21  2307     History  No chief complaint on file.   Robert [name] is a 61 y.o. 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  Presents to the emergency department with a chief plaint of right lower quadrant abdominal pain.  Patient states that pain started approximately 9:30 PM.  Pain has been constant since then.  Pain does not radiate.  Patient rates pain 6/10 on a pain scale.  Patient also endorses nausea and fever.  Patient reports that he had temperature of 102 F orally earlier today at his skilled nursing facility.  Patient did not receive any Tylenol for this fever.  Patient states that he has not received his nightly dose of CellCept.  Denies any rhinorrhea, nasal congestion, sore throat, cough, dysuria, hematuria, urinary urgency, constipation, diarrhea, blood in stool, melena, discharge, swelling or tenderness to genitals.  HPI     Home Medications Prior to Admission medications   Medication Sig Start Date End Date Taking? Authorizing Provider  acetaminophen (TYLENOL) 325 MG tablet Take 2 tablets (650 mg total) by mouth every 4 (four) hours as needed for moderate pain. 10/15/21   Nani Skillern, PA-C  amiodarone (PACERONE) 200 MG tablet Take 1 tablet (200 mg total) by mouth 2 (two) times daily. 10/18/21   Gold, Wayne E, PA-C  amLODipine (NORVASC) 10 MG tablet TAKE 1 TABLET BY MOUTH EVERY DAY Patient taking differently: Take 10 mg by mouth daily. 09/11/21   Troy Sine, MD  atorvastatin (LIPITOR) 80 MG tablet Take 1 tablet (80 mg total) by mouth daily. 10/19/21   Gold, Wilder Glade, PA-C  belatacept (NULOJIX) 250 MG SOLR injection Inject 24.5 mLs into the vein every 28 (twenty-eight) days.  08/30/17   [provider]  carvedilol (COREG) 25 MG tablet Take 1 tablet (25 mg total) by mouth 2 (two) times daily with a meal. 10/26/21   Dahal, Marlowe Aschoff, MD  cholecalciferol (VITAMIN D3) 25 MCG (1000 UNIT) tablet Take 1,000 Units by mouth in the morning and at bedtime.    [provider]  doxazosin (CARDURA) 2 MG tablet Take 1 tablet (2 mg total) by mouth daily. 10/26/21   Terrilee Croak, MD  DULoxetine (CYMBALTA) 60 MG capsule Take 60 mg by mouth daily. 07/19/20   [provider]  gabapentin (NEURONTIN) 100 MG capsule Take 200 mg by mouth daily. 06/02/16   [provider]  insulin aspart (NOVOLOG) 100 UNIT/ML injection Inject 0-9 Units into the skin 3 (three) times daily with meals. Patient taking differently: Inject 0-9 Units into the skin 3 (three) times daily with meals. 70-120=0, 121-150=1 units, 151-200=2 units, 201-250=3 units, 251-300= 5units, 301-350= 7units, 351-400= 9 units, 401 NOTIFY MD/NP IF CBG IS > 400 10/18/21   Gold, Wayne E, PA-C  insulin glargine (LANTUS) 100 unit/mL SOPN Inject 10 Units into the skin daily. 10/26/21   Terrilee Croak, MD  levothyroxine (SYNTHROID) 100 MCG tablet TAKE 1 TABLET BY MOUTH EVERY DAY Patient taking differently: Take 100 mcg by mouth daily before breakfast. 06/13/21   Elayne Snare, MD  lisinopril (ZESTRIL) 40 MG tablet Take 1 tablet (40 mg total) by mouth daily. 10/19/21   Gold, Wilder Glade, PA-C  mycophenolate (CELLCEPT) 250 MG capsule Take 1,000 mg by mouth every 12 (twelve) hours.  [provider]  predniSONE (DELTASONE) 5 MG tablet Take 5 mg by mouth daily with breakfast.    [provider]  senna-docusate (SENOKOT-S) 8.6-50 MG tablet Take 1 tablet by mouth at bedtime as needed for mild constipation. 10/26/21   Terrilee Croak, MD  tamsulosin (FLOMAX) 0.4 MG CAPS capsule Take 0.8 mg by mouth at bedtime.     [provider]  testosterone cypionate (DEPOTESTOSTERONE CYPIONATE) 200 MG/ML injection Inject 0.6 mLs (120 mg  total) into the muscle every 7 (seven) days. 07/10/21   Elayne Snare, MD  warfarin (COUMADIN) 2.5 MG tablet Take 1 tablet (2.5 mg total) by mouth daily at 4 PM. 10/18/21   Gold, Wilder Glade, PA-C      Allergies    Patient has no known allergies.    Review of Systems   Review of Systems  Constitutional:  Positive for fever. Negative for chills.  HENT:  Negative for congestion, rhinorrhea and sore throat.   Eyes:  Negative for visual disturbance.  Respiratory:  Negative for shortness of breath.   Cardiovascular:  Negative for chest pain.  Gastrointestinal:  Positive for abdominal pain and nausea. Negative for abdominal distention, anal bleeding, blood in stool, constipation, diarrhea, rectal pain and vomiting.  Genitourinary:  Negative for difficulty urinating, dysuria, flank pain, frequency, genital sores, hematuria, penile discharge, penile pain, penile swelling, scrotal swelling, testicular pain and urgency.  Musculoskeletal:  Negative for back pain and neck pain.  Skin:  Negative for color change and rash.  Neurological:  Negative for dizziness, syncope, light-headedness and headaches.  Psychiatric/Behavioral:  Negative for confusion.    Physical Exam Updated Vital Signs BP (!) 172/71 (BP Location: Left Wrist)    Pulse 78    Temp (!) 101.1 F (38.4 C) (Rectal)    Resp (!) 21    SpO2 95%   Physical Exam Vitals and nursing note reviewed.  Constitutional:      General: He is not in acute distress.    Appearance: He is morbidly obese. He is not ill-appearing, toxic-appearing or diaphoretic.  HENT:     Head: Normocephalic.  Eyes:     General: No scleral icterus.       Right eye: No discharge.        Left eye: No discharge.  Cardiovascular:     Rate and Rhythm: Normal rate.  Pulmonary:     Effort: Pulmonary effort is normal. No tachypnea or bradypnea.     Breath sounds: Normal breath sounds. No stridor.  Abdominal:     General: Abdomen is protuberant. Bowel sounds are normal. There is  no distension. There are no signs of injury.     Palpations: Abdomen is soft. There is no mass or pulsatile mass.     Tenderness: There is abdominal tenderness in the right lower quadrant and suprapubic area. There is no guarding or rebound.     Hernia: There is no hernia in the umbilical area or ventral area.  Skin:    General: Skin is warm and dry.  Neurological:     General: No focal deficit present.     Mental Status: He is alert.  Psychiatric:        Behavior: Behavior is cooperative.    ED Results / Procedures / Treatments   Labs (all labs ordered are listed, but only abnormal results are displayed) Labs Reviewed  COMPREHENSIVE METABOLIC PANEL - Abnormal; Notable for the following components:      Result Value   Sodium 128 (*)  CO2 18 (*)    Glucose, Bld 277 (*)    BUN 45 (*)    Creatinine, Ser 2.89 (*)    Total Protein 5.7 (*)    Albumin 2.9 (*)    ALT 47 (*)    GFR, Estimated 24 (*)    All other components within normal limits  CBC WITH DIFFERENTIAL/PLATELET - Abnormal; Notable for the following components:   Hemoglobin 11.8 (*)    HCT 36.6 (*)    Neutro Abs 7.9 (*)    Lymphs Abs 0.3 (*)    Abs Immature Granulocytes 0.12 (*)    All other components within normal limits  PROTIME-INR - Abnormal; Notable for the following components:   Prothrombin Time 16.3 (*)    INR 1.3 (*)    All other components within normal limits  URINALYSIS, ROUTINE W REFLEX MICROSCOPIC - Abnormal; Notable for the following components:   APPearance CLOUDY (*)    Glucose, UA 250 (*)    Hgb urine dipstick LARGE (*)    Protein, ur 100 (*)    Leukocytes,Ua MODERATE (*)    All other components within normal limits  URINALYSIS, MICROSCOPIC (REFLEX) - Abnormal; Notable for the following components:   Bacteria, UA MANY (*)    All other components within normal limits  CBG MONITORING, ED - Abnormal; Notable for the following components:   Glucose-Capillary 254 (*)    All other components  within normal limits  CULTURE, BLOOD (ROUTINE X 2)  CULTURE, BLOOD (ROUTINE X 2)  URINE CULTURE  RESP PANEL BY RT-PCR (FLU A&B, COVID) ARPGX2  LACTIC ACID, PLASMA  APTT  LACTIC ACID, PLASMA    EKG None  Radiology CT ABDOMEN PELVIS WO CONTRAST  Result Date: 11/09/2021 CLINICAL DATA:  Right lower quadrant abdominal pain, renal transplant EXAM: CT ABDOMEN AND PELVIS WITHOUT CONTRAST TECHNIQUE: Multidetector CT imaging of the abdomen and pelvis was performed following the standard protocol without IV contrast. RADIATION DOSE REDUCTION: This exam was performed according to the departmental dose-optimization program which includes automated exposure control, adjustment of the mA and/or kV according to patient size and/or use of iterative reconstruction technique. COMPARISON:  10/09/2021 FINDINGS: Lower chest: Small bilateral pleural effusions. Associated lower lobe atelectasis. This is grossly unchanged. Hepatobiliary: Unenhanced liver is unremarkable. Layering gallstones and/or gallbladder sludge (series 3/image 31), without associated laboratory changes. No intrahepatic or extrahepatic duct dilatation. Pancreas: Within normal limits. Spleen: Within normal limits. Adrenals/Urinary Tract: Adrenal glands are within normal limits. Status post left nephrectomy. Mild right renal atrophy. No hydronephrosis. Right lower quadrant renal transplant without hydronephrosis. Bladder is mildly thick-walled although underdistended. Stomach/Bowel: Stomach is within normal limits. No evidence of bowel obstruction. Normal appendix (series 3/image 87). Left colonic diverticulosis, without evidence of diverticulitis. Vascular/Lymphatic: No evidence of abdominal aortic aneurysm. Atherosclerotic calcifications of the abdominal aorta and branch vessels. No suspicious abdominopelvic lymphadenopathy. Reproductive: Prostate is unremarkable. Other: No abdominopelvic ascites. Musculoskeletal: Degenerative changes of the visualized  thoracolumbar spine, most prominent L1-2. IMPRESSION: Right lower quadrant renal transplant without hydronephrosis. No evidence of bowel obstruction. Normal appendix. Left colonic diverticulosis, without evidence of diverticulitis. Cholelithiasis, without associated inflammatory changes. Small bilateral pleural effusions, unchanged. Electronically Signed   By: Julian Hy M.D.   On: 11/09/2021 01:36   DG Chest Port 1 View  Result Date: 11/09/2021 CLINICAL DATA:  Questionable sepsis. EXAM: PORTABLE CHEST 1 VIEW COMPARISON:  Chest x-ray 10/25/2021. FINDINGS: Sternotomy wires and prosthetic heart valve are again seen. The cardiomediastinal silhouette is enlarged unchanged. There is  central pulmonary vascular congestion. There is no lung consolidation, pleural effusion or pneumothorax identified. No acute fractures are seen. IMPRESSION: 1. Cardiomegaly with central pulmonary vascular congestion. Electronically Signed   By: Ronney Asters M.D.   On: 11/09/2021 00:24    Procedures Procedures    Medications Ordered in ED Medications  piperacillin-tazobactam (ZOSYN) IVPB 3.375 g (3.375 g Intravenous New Bag/Given 11/09/21 0051)  vancomycin (VANCOREADY) IVPB 2000 mg/400 mL (has no administration in time range)  HYDROmorphone (DILAUDID) injection 1 mg (1 mg Intravenous Given 11/09/21 0034)  mycophenolate (CELLCEPT) capsule 250 mg (250 mg Oral Given 11/09/21 0056)  acetaminophen (TYLENOL) tablet 1,000 mg (1,000 mg Oral Given 11/09/21 0057)  sodium chloride 0.9 % bolus 1,000 mL (1,000 mLs Intravenous New Bag/Given 11/09/21 0058)  mycophenolate (CELLCEPT) capsule 750 mg (750 mg Oral Given 11/09/21 0242)    ED Course/ Medical Decision Making/ A&P Clinical Course as of 11/09/21 0304  Thu Nov 09, 2021  0303 Spoke to hospitalist Dr. Bridgett Larsson who will see the patient for admission [PB]    Clinical Course User Index [PB] Loni Beckwith, PA-C                           Medical Decision Making Amount and/or  Complexity of Data Reviewed Labs: ordered. Radiology: ordered. ECG/medicine tests: ordered.  Risk OTC drugs. Prescription drug management. Decision regarding hospitalization.   Alert 61 year old male no acute distress, nontoxic-appearing.  Presents emergency department with a chief complaint of right lower quadrant pain.  Patient has past medical history of CKD s/p renal transplant on immunosuppression, CAD, hypertension, hyperlipidemia, diabetes mellitus that complicates care.  Information was obtained from patient and patient's brother at bedside.  Medical records were obtained and reviewed including previous lab results, provider notes, and imaging.  Patient is febrile with tenderness to right lower quadrant.  Due to patient's renal transplant and immunosuppression concern for possible pyelonephritis.  Will initiate ED sepsis protocol.  We will start patient on Vanco and Zosyn empirically.  Given Tylenol for fever.  Patient given Dilaudid for pain management.  Labs were ordered and independently reviewed myself.  Pertinent findings include: -Na 128 -CO2 18, appears baseline for patient -BUN 45 and creatinine 2.9, increased from patient's baseline. -Lactic acid within normal limits -Hemoglobin 11.8, hematocrit 36.6, baseline for patient. -Urinalysis shows bacteria many, leukocyte moderate, nitrite negative.  X-ray imaging was ordered and personally reviewed myself.  Imaging shows no acute infectious process.  Cardiomegaly is noted.  CT abdomen pelvis was ordered to evaluate for patient's abdominal pain.  Scan was obtained without contrast due to patient's GFR.  On serial reexamination abdomen remains soft, nondistended, improvement in tenderness.  Patient had improvement in pain after receiving Dilaudid.  Due to patient having urinary tract infection while immunosuppressed and with a history of renal transplant will consult hospitalist for admission.  Patient does not meet sepsis  criteria at this time.  Spoke to Dr. Bridgett Larsson who will see the patient for admission.    Patient care was discussed with attending physician Dr. Sedonia Small        Final Clinical Impression(s) / ED Diagnoses Final diagnoses:  Urinary tract infection with hematuria, site unspecified    Rx / DC Orders ED Discharge Orders     None         Loni Beckwith, PA-C 11/09/21 0305    Maudie Flakes, MD 11/09/21 [phone]

## 2021-11-08 NOTE — Telephone Encounter (Signed)
See previous phone note as spoke with patient today.   Tried to contact [place], NP and no answer.

## 2021-11-08 NOTE — Telephone Encounter (Signed)
Spoke with patient as he was upset that his Blood pressure readings are not doing well and he thinks it needs to be addressed by our office. Robert [name] was unable to provide any of his readings or a list of his medications that he takes. Patient was informed that he has an appointment on 2/8 with Dr. Claiborne Billings and his facility will send all required information at that time with him. The patient is very worried that they will not do that and his visit will be worthless. Patient was informed that I would contact his facility on his behalf to get what information they have at this point but a lot could change between now and his appointment.   I spoke with Charge nurse, Robert [name], and she was unable to send me his medications as she stated things could change before his visit. I informed her of patients concerns and asked if she could at least send [place] a copy of the patients BP readings for the past 2 weeks. She agreed and the reading have been faxed and added to the patients file for Dr. Claiborne Billings to review during visit.   Contacted patient and made aware of records received. Appreciative of the efforts and call, no additional questions at this time.

## 2021-11-08 NOTE — Telephone Encounter (Signed)
Patient is calling back in regards to this matter upset that he has not spoken with anyone in regards to this. I advised him based on our documentation that Sharyn Lull attempted to return his call on 01/30 leaving a voicemail and he was also responded to by another RN on 01/30 via MyChart. He stated he tried to reach our office, but was unable to get through. Patient then requested to speak with Sharyn Lull, and I advised him she is not in office today. I informed the patient I would send a message and have a triage nurse return his call. He then requested to speak with Dr. Johney Frame stating that he was advised they would be getting a care team together for him. I told the patient I was unable to do this since he is not a patient of her. Patient then requested to speak with office manager, so the call was transferred to [facility] VM per team lead advising to transfer to her.

## 2021-11-09 ENCOUNTER — Other Ambulatory Visit: Payer: Self-pay

## 2021-11-09 ENCOUNTER — Encounter (HOSPITAL_COMMUNITY): Payer: Self-pay | Admitting: Internal Medicine

## 2021-11-09 ENCOUNTER — Emergency Department (HOSPITAL_COMMUNITY): Payer: 59

## 2021-11-09 DIAGNOSIS — E1165 Type 2 diabetes mellitus with hyperglycemia: Secondary | ICD-10-CM | POA: Diagnosis present

## 2021-11-09 DIAGNOSIS — D631 Anemia in chronic kidney disease: Secondary | ICD-10-CM | POA: Diagnosis present

## 2021-11-09 DIAGNOSIS — N179 Acute kidney failure, unspecified: Secondary | ICD-10-CM | POA: Diagnosis present

## 2021-11-09 DIAGNOSIS — Z8673 Personal history of transient ischemic attack (TIA), and cerebral infarction without residual deficits: Secondary | ICD-10-CM | POA: Diagnosis not present

## 2021-11-09 DIAGNOSIS — I6310 Cerebral infarction due to embolism of unspecified precerebral artery: Secondary | ICD-10-CM

## 2021-11-09 DIAGNOSIS — Z85828 Personal history of other malignant neoplasm of skin: Secondary | ICD-10-CM | POA: Diagnosis not present

## 2021-11-09 DIAGNOSIS — N3000 Acute cystitis without hematuria: Secondary | ICD-10-CM

## 2021-11-09 DIAGNOSIS — Z6837 Body mass index (BMI) 37.0-37.9, adult: Secondary | ICD-10-CM | POA: Diagnosis not present

## 2021-11-09 DIAGNOSIS — E1122 Type 2 diabetes mellitus with diabetic chronic kidney disease: Secondary | ICD-10-CM | POA: Diagnosis present

## 2021-11-09 DIAGNOSIS — E785 Hyperlipidemia, unspecified: Secondary | ICD-10-CM | POA: Diagnosis present

## 2021-11-09 DIAGNOSIS — Z8616 Personal history of COVID-19: Secondary | ICD-10-CM | POA: Diagnosis not present

## 2021-11-09 DIAGNOSIS — Y830 Surgical operation with transplant of whole organ as the cause of abnormal reaction of the patient, or of later complication, without mention of misadventure at the time of the procedure: Secondary | ICD-10-CM | POA: Diagnosis present

## 2021-11-09 DIAGNOSIS — Z7901 Long term (current) use of anticoagulants: Secondary | ICD-10-CM

## 2021-11-09 DIAGNOSIS — T8619 Other complication of kidney transplant: Secondary | ICD-10-CM | POA: Diagnosis present

## 2021-11-09 DIAGNOSIS — Z940 Kidney transplant status: Secondary | ICD-10-CM | POA: Diagnosis not present

## 2021-11-09 DIAGNOSIS — E871 Hypo-osmolality and hyponatremia: Secondary | ICD-10-CM

## 2021-11-09 DIAGNOSIS — I129 Hypertensive chronic kidney disease with stage 1 through stage 4 chronic kidney disease, or unspecified chronic kidney disease: Secondary | ICD-10-CM | POA: Diagnosis present

## 2021-11-09 DIAGNOSIS — N10 Acute tubulo-interstitial nephritis: Secondary | ICD-10-CM | POA: Diagnosis present

## 2021-11-09 DIAGNOSIS — E860 Dehydration: Secondary | ICD-10-CM | POA: Diagnosis present

## 2021-11-09 DIAGNOSIS — E039 Hypothyroidism, unspecified: Secondary | ICD-10-CM | POA: Diagnosis present

## 2021-11-09 DIAGNOSIS — I2510 Atherosclerotic heart disease of native coronary artery without angina pectoris: Secondary | ICD-10-CM | POA: Diagnosis present

## 2021-11-09 DIAGNOSIS — I10 Essential (primary) hypertension: Secondary | ICD-10-CM

## 2021-11-09 DIAGNOSIS — Z952 Presence of prosthetic heart valve: Secondary | ICD-10-CM | POA: Diagnosis not present

## 2021-11-09 DIAGNOSIS — Z794 Long term (current) use of insulin: Secondary | ICD-10-CM | POA: Diagnosis not present

## 2021-11-09 DIAGNOSIS — B961 Klebsiella pneumoniae [K. pneumoniae] as the cause of diseases classified elsewhere: Secondary | ICD-10-CM | POA: Diagnosis present

## 2021-11-09 DIAGNOSIS — Z951 Presence of aortocoronary bypass graft: Secondary | ICD-10-CM | POA: Diagnosis not present

## 2021-11-09 DIAGNOSIS — N1832 Chronic kidney disease, stage 3b: Secondary | ICD-10-CM

## 2021-11-09 DIAGNOSIS — G4733 Obstructive sleep apnea (adult) (pediatric): Secondary | ICD-10-CM | POA: Diagnosis present

## 2021-11-09 HISTORY — DX: Hypo-osmolality and hyponatremia: E87.1

## 2021-11-09 HISTORY — DX: Acute cystitis without hematuria: N30.00

## 2021-11-09 HISTORY — DX: Long term (current) use of anticoagulants: Z79.01

## 2021-11-09 LAB — COMPREHENSIVE METABOLIC PANEL
ALT: 43 U/L (ref 0–44)
ALT: 47 U/L — ABNORMAL HIGH (ref 0–44)
AST: 27 U/L (ref 15–41)
AST: 34 U/L (ref 15–41)
Albumin: 2.6 g/dL — ABNORMAL LOW (ref 3.5–5.0)
Albumin: 2.9 g/dL — ABNORMAL LOW (ref 3.5–5.0)
Alkaline Phosphatase: 93 U/L (ref 38–126)
Alkaline Phosphatase: 99 U/L (ref 38–126)
Anion gap: 11 (ref 5–15)
Anion gap: 9 (ref 5–15)
BUN: 43 mg/dL — ABNORMAL HIGH (ref 8–23)
BUN: 45 mg/dL — ABNORMAL HIGH (ref 8–23)
CO2: 18 mmol/L — ABNORMAL LOW (ref 22–32)
CO2: 20 mmol/L — ABNORMAL LOW (ref 22–32)
Calcium: 9.5 mg/dL (ref 8.9–10.3)
Calcium: 9.8 mg/dL (ref 8.9–10.3)
Chloride: 102 mmol/L (ref 98–111)
Chloride: 99 mmol/L (ref 98–111)
Creatinine, Ser: 2.78 mg/dL — ABNORMAL HIGH (ref 0.61–1.24)
Creatinine, Ser: 2.89 mg/dL — ABNORMAL HIGH (ref 0.61–1.24)
GFR, Estimated: 24 mL/min — ABNORMAL LOW (ref >60)
GFR, Estimated: 25 mL/min — ABNORMAL LOW (ref >60)
Glucose, Bld: 241 mg/dL — ABNORMAL HIGH (ref 70–99)
Glucose, Bld: 277 mg/dL — ABNORMAL HIGH (ref 70–99)
Potassium: 4 mmol/L (ref 3.5–5.1)
Potassium: 4.1 mmol/L (ref 3.5–5.1)
Sodium: 128 mmol/L — ABNORMAL LOW (ref 135–145)
Sodium: 131 mmol/L — ABNORMAL LOW (ref 135–145)
Total Bilirubin: 0.8 mg/dL (ref 0.3–1.2)
Total Bilirubin: 1 mg/dL (ref 0.3–1.2)
Total Protein: 5.4 g/dL — ABNORMAL LOW (ref 6.5–8.1)
Total Protein: 5.7 g/dL — ABNORMAL LOW (ref 6.5–8.1)

## 2021-11-09 LAB — RESP PANEL BY RT-PCR (FLU A&B, COVID) ARPGX2
Influenza A by PCR: NEGATIVE
Influenza B by PCR: NEGATIVE
SARS Coronavirus 2 by RT PCR: POSITIVE — AB

## 2021-11-09 LAB — CBC WITH DIFFERENTIAL/PLATELET
Abs Immature Granulocytes: 0.12 10*3/uL — ABNORMAL HIGH (ref 0.00–0.07)
Abs Immature Granulocytes: 0.16 10*3/uL — ABNORMAL HIGH (ref 0.00–0.07)
Basophils Absolute: 0 10*3/uL (ref 0.0–0.1)
Basophils Absolute: 0 10*3/uL (ref 0.0–0.1)
Basophils Relative: 0 %
Basophils Relative: 0 %
Eosinophils Absolute: 0.1 10*3/uL (ref 0.0–0.5)
Eosinophils Absolute: 0.1 10*3/uL (ref 0.0–0.5)
Eosinophils Relative: 1 %
Eosinophils Relative: 1 %
HCT: 35 % — ABNORMAL LOW (ref 39.0–52.0)
HCT: 36.6 % — ABNORMAL LOW (ref 39.0–52.0)
Hemoglobin: 11.1 g/dL — ABNORMAL LOW (ref 13.0–17.0)
Hemoglobin: 11.8 g/dL — ABNORMAL LOW (ref 13.0–17.0)
Immature Granulocytes: 1 %
Immature Granulocytes: 2 %
Lymphocytes Relative: 3 %
Lymphocytes Relative: 4 %
Lymphs Abs: 0.3 10*3/uL — ABNORMAL LOW (ref 0.7–4.0)
Lymphs Abs: 0.4 10*3/uL — ABNORMAL LOW (ref 0.7–4.0)
MCH: 27.1 pg (ref 26.0–34.0)
MCH: 27.8 pg (ref 26.0–34.0)
MCHC: 31.7 g/dL (ref 30.0–36.0)
MCHC: 32.2 g/dL (ref 30.0–36.0)
MCV: 85.6 fL (ref 80.0–100.0)
MCV: 86.3 fL (ref 80.0–100.0)
Monocytes Absolute: 0.7 10*3/uL (ref 0.1–1.0)
Monocytes Absolute: 0.8 10*3/uL (ref 0.1–1.0)
Monocytes Relative: 8 %
Monocytes Relative: 9 %
Neutro Abs: 7.6 10*3/uL (ref 1.7–7.7)
Neutro Abs: 7.9 10*3/uL — ABNORMAL HIGH (ref 1.7–7.7)
Neutrophils Relative %: 84 %
Neutrophils Relative %: 87 %
Platelets: 211 10*3/uL (ref 150–400)
Platelets: 213 10*3/uL (ref 150–400)
RBC: 4.09 MIL/uL — ABNORMAL LOW (ref 4.22–5.81)
RBC: 4.24 MIL/uL (ref 4.22–5.81)
RDW: 14.6 % (ref 11.5–15.5)
RDW: 14.6 % (ref 11.5–15.5)
WBC: 9 10*3/uL (ref 4.0–10.5)
WBC: 9.1 10*3/uL (ref 4.0–10.5)
nRBC: 0 % (ref 0.0–0.2)
nRBC: 0 % (ref 0.0–0.2)

## 2021-11-09 LAB — OSMOLALITY: Osmolality: 292 mOsm/kg (ref 275–295)

## 2021-11-09 LAB — URINALYSIS, ROUTINE W REFLEX MICROSCOPIC
Bilirubin Urine: NEGATIVE
Glucose, UA: 250 mg/dL — AB
Ketones, ur: NEGATIVE mg/dL
Nitrite: NEGATIVE
Protein, ur: 100 mg/dL — AB
Specific Gravity, Urine: 1.02 (ref 1.005–1.030)
pH: 5.5 (ref 5.0–8.0)

## 2021-11-09 LAB — GLUCOSE, CAPILLARY
Glucose-Capillary: 276 mg/dL — ABNORMAL HIGH (ref 70–99)
Glucose-Capillary: 216 mg/dL — ABNORMAL HIGH (ref 70–99)

## 2021-11-09 LAB — URINALYSIS, MICROSCOPIC (REFLEX)
RBC / HPF: >50 RBC/hpf (ref 0–5)
WBC, UA: >50 WBC/hpf (ref 0–5)

## 2021-11-09 LAB — CBG MONITORING, ED
Glucose-Capillary: 224 mg/dL — ABNORMAL HIGH (ref 70–99)
Glucose-Capillary: 244 mg/dL — ABNORMAL HIGH (ref 70–99)

## 2021-11-09 LAB — MAGNESIUM: Magnesium: 1.8 mg/dL (ref 1.7–2.4)

## 2021-11-09 LAB — MRSA NEXT GEN BY PCR, NASAL: MRSA by PCR Next Gen: NOT DETECTED

## 2021-11-09 LAB — APTT: aPTT: 33 seconds (ref 24–36)

## 2021-11-09 LAB — CREATININE, URINE, RANDOM: Creatinine, Urine: 73.29 mg/dL

## 2021-11-09 LAB — PROCALCITONIN: Procalcitonin: 0.91 ng/mL

## 2021-11-09 LAB — PROTIME-INR
INR: 1.3 — ABNORMAL HIGH (ref 0.8–1.2)
Prothrombin Time: 16.3 seconds — ABNORMAL HIGH (ref 11.4–15.2)

## 2021-11-09 LAB — SODIUM, URINE, RANDOM: Sodium, Ur: 67 mmol/L

## 2021-11-09 LAB — LACTIC ACID, PLASMA: Lactic Acid, Venous: 0.9 mmol/L (ref 0.5–1.9)

## 2021-11-09 LAB — URIC ACID: Uric Acid, Serum: 6.6 mg/dL (ref 3.7–8.6)

## 2021-11-09 LAB — OSMOLALITY, URINE: Osmolality, Ur: 396 mOsm/kg (ref 300–900)

## 2021-11-09 MED ORDER — DULOXETINE HCL 60 MG PO CPEP
60.0000 mg | ORAL_CAPSULE | Freq: Every day | ORAL | Status: DC
  Administered 2021-11-09 – 2021-11-16 (×8): 60 mg via ORAL
  Filled 2021-11-09 (×8): qty 1

## 2021-11-09 MED ORDER — WARFARIN - PHARMACIST DOSING INPATIENT: Freq: Every day | Status: DC

## 2021-11-09 MED ORDER — SODIUM CHLORIDE 0.9 % IV BOLUS
1000.0000 mL | Freq: Once | INTRAVENOUS | Status: AC
Start: 2021-11-09 — End: 2021-11-09
  Administered 2021-11-09: 1000 mL via INTRAVENOUS

## 2021-11-09 MED ORDER — MYCOPHENOLATE MOFETIL 250 MG PO CAPS
750.0000 mg | ORAL_CAPSULE | Freq: Once | ORAL | Status: AC
  Administered 2021-11-09: 750 mg via ORAL
  Filled 2021-11-09: qty 3

## 2021-11-09 MED ORDER — ROSUVASTATIN CALCIUM 20 MG PO TABS: 20.0000 mg | ORAL_TABLET | Freq: Every day | ORAL | Status: DC

## 2021-11-09 MED ORDER — AMLODIPINE BESYLATE 10 MG PO TABS
10.0000 mg | ORAL_TABLET | Freq: Every day | ORAL | Status: DC
Start: 2021-11-09 — End: 2021-11-16
  Administered 2021-11-09 – 2021-11-15 (×7): 10 mg via ORAL
  Filled 2021-11-09 (×7): qty 1

## 2021-11-09 MED ORDER — AMIODARONE HCL 200 MG PO TABS
200.0000 mg | ORAL_TABLET | Freq: Two times a day (BID) | ORAL | Status: DC
  Administered 2021-11-09 – 2021-11-16 (×15): 200 mg via ORAL
  Filled 2021-11-09 (×15): qty 1

## 2021-11-09 MED ORDER — WARFARIN SODIUM 7.5 MG PO TABS
7.5000 mg | ORAL_TABLET | Freq: Once | ORAL | Status: AC
  Administered 2021-11-09: 7.5 mg via ORAL
  Filled 2021-11-09 (×2): qty 1

## 2021-11-09 MED ORDER — MYCOPHENOLATE MOFETIL 250 MG PO CAPS
1000.0000 mg | ORAL_CAPSULE | Freq: Two times a day (BID) | ORAL | Status: DC
  Administered 2021-11-09 – 2021-11-16 (×15): 1000 mg via ORAL
  Filled 2021-11-09 (×16): qty 4

## 2021-11-09 MED ORDER — CARVEDILOL 25 MG PO TABS
25.0000 mg | ORAL_TABLET | Freq: Two times a day (BID) | ORAL | Status: DC
  Administered 2021-11-09 – 2021-11-16 (×15): 25 mg via ORAL
  Filled 2021-11-09 (×5): qty 1
  Filled 2021-11-09: qty 2
  Filled 2021-11-09 (×9): qty 1

## 2021-11-09 MED ORDER — ALPRAZOLAM 0.25 MG PO TABS
0.2500 mg | ORAL_TABLET | Freq: Two times a day (BID) | ORAL | Status: DC | PRN
  Administered 2021-11-11 – 2021-11-13 (×3): 0.25 mg via ORAL
  Filled 2021-11-09 (×3): qty 1

## 2021-11-09 MED ORDER — ROSUVASTATIN CALCIUM 20 MG PO TABS
40.0000 mg | ORAL_TABLET | Freq: Every day | ORAL | Status: DC
  Administered 2021-11-09 – 2021-11-16 (×8): 40 mg via ORAL
  Filled 2021-11-09 (×9): qty 2

## 2021-11-09 MED ORDER — MYCOPHENOLATE MOFETIL 250 MG PO CAPS
250.0000 mg | ORAL_CAPSULE | Freq: Once | ORAL | Status: AC
  Administered 2021-11-09: 250 mg via ORAL
  Filled 2021-11-09: qty 1

## 2021-11-09 MED ORDER — FAMOTIDINE 20 MG PO TABS
20.0000 mg | ORAL_TABLET | Freq: Every day | ORAL | Status: DC
  Administered 2021-11-09 – 2021-11-16 (×8): 20 mg via ORAL
  Filled 2021-11-09 (×9): qty 1

## 2021-11-09 MED ORDER — PIPERACILLIN-TAZOBACTAM 3.375 G IVPB
3.3750 g | Freq: Three times a day (TID) | INTRAVENOUS | Status: DC
  Administered 2021-11-09 – 2021-11-11 (×6): 3.375 g via INTRAVENOUS
  Filled 2021-11-09 (×7): qty 50

## 2021-11-09 MED ORDER — SODIUM CHLORIDE 0.9 % IV SOLN: INTRAVENOUS | Status: AC

## 2021-11-09 MED ORDER — INSULIN ASPART 100 UNIT/ML IJ SOLN
0.0000 [IU] | Freq: Three times a day (TID) | INTRAMUSCULAR | Status: DC
  Administered 2021-11-09: 5 [IU] via SUBCUTANEOUS
  Administered 2021-11-09: 8 [IU] via SUBCUTANEOUS
  Administered 2021-11-09 – 2021-11-10 (×2): 5 [IU] via SUBCUTANEOUS
  Administered 2021-11-10: 3 [IU] via SUBCUTANEOUS
  Administered 2021-11-10 – 2021-11-11 (×3): 5 [IU] via SUBCUTANEOUS
  Administered 2021-11-11 – 2021-11-12 (×2): 3 [IU] via SUBCUTANEOUS
  Administered 2021-11-12: 5 [IU] via SUBCUTANEOUS
  Administered 2021-11-12: 3 [IU] via SUBCUTANEOUS
  Administered 2021-11-13: 5 [IU] via SUBCUTANEOUS
  Administered 2021-11-13: 3 [IU] via SUBCUTANEOUS
  Administered 2021-11-13: 5 [IU] via SUBCUTANEOUS
  Administered 2021-11-14: 3 [IU] via SUBCUTANEOUS
  Administered 2021-11-14: 11 [IU] via SUBCUTANEOUS
  Administered 2021-11-14 – 2021-11-15 (×2): 8 [IU] via SUBCUTANEOUS
  Administered 2021-11-15: 5 [IU] via SUBCUTANEOUS
  Administered 2021-11-15: 3 [IU] via SUBCUTANEOUS
  Administered 2021-11-16: 5 [IU] via SUBCUTANEOUS
  Administered 2021-11-16: 3 [IU] via SUBCUTANEOUS

## 2021-11-09 MED ORDER — ENOXAPARIN SODIUM 150 MG/ML IJ SOSY
130.0000 mg | PREFILLED_SYRINGE | Freq: Two times a day (BID) | INTRAMUSCULAR | Status: DC
  Administered 2021-11-09 – 2021-11-11 (×5): 130 mg via SUBCUTANEOUS
  Filled 2021-11-09 (×6): qty 0.86

## 2021-11-09 MED ORDER — INSULIN ASPART 100 UNIT/ML IJ SOLN
0.0000 [IU] | Freq: Every day | INTRAMUSCULAR | Status: DC
  Administered 2021-11-09 – 2021-11-12 (×2): 2 [IU] via SUBCUTANEOUS
  Administered 2021-11-13 – 2021-11-15 (×2): 3 [IU] via SUBCUTANEOUS

## 2021-11-09 MED ORDER — PREDNISONE 5 MG PO TABS
5.0000 mg | ORAL_TABLET | Freq: Every day | ORAL | Status: DC
  Administered 2021-11-09 – 2021-11-16 (×8): 5 mg via ORAL
  Filled 2021-11-09 (×8): qty 1

## 2021-11-09 MED ORDER — ACETAMINOPHEN 325 MG PO TABS: 650.0000 mg | ORAL_TABLET | Freq: Four times a day (QID) | ORAL | Status: DC | PRN

## 2021-11-09 MED ORDER — TAMSULOSIN HCL 0.4 MG PO CAPS
0.8000 mg | ORAL_CAPSULE | Freq: Every day | ORAL | Status: DC
  Administered 2021-11-09 – 2021-11-15 (×7): 0.8 mg via ORAL
  Filled 2021-11-09 (×7): qty 2

## 2021-11-09 MED ORDER — INSULIN GLARGINE-YFGN 100 UNIT/ML ~~LOC~~ SOLN
15.0000 [IU] | Freq: Every day | SUBCUTANEOUS | Status: DC
  Administered 2021-11-09 – 2021-11-16 (×8): 15 [IU] via SUBCUTANEOUS
  Filled 2021-11-09 (×8): qty 0.15

## 2021-11-09 MED ORDER — ACETAMINOPHEN 650 MG RE SUPP: 650.0000 mg | Freq: Four times a day (QID) | RECTAL | Status: DC | PRN

## 2021-11-09 MED ORDER — HYDRALAZINE HCL 20 MG/ML IJ SOLN: 10.0000 mg | Freq: Four times a day (QID) | INTRAMUSCULAR | Status: DC | PRN

## 2021-11-09 MED ORDER — INSULIN ASPART 100 UNIT/ML IJ SOLN
4.0000 [IU] | Freq: Three times a day (TID) | INTRAMUSCULAR | Status: DC
Start: 2021-11-09 — End: 2021-11-16
  Administered 2021-11-09 – 2021-11-16 (×20): 4 [IU] via SUBCUTANEOUS

## 2021-11-09 MED ORDER — ALBUTEROL SULFATE (2.5 MG/3ML) 0.083% IN NEBU: 2.5000 mg | INHALATION_SOLUTION | RESPIRATORY_TRACT | Status: DC | PRN

## 2021-11-09 MED ORDER — ONDANSETRON HCL 4 MG PO TABS: 4.0000 mg | ORAL_TABLET | Freq: Four times a day (QID) | ORAL | Status: DC | PRN

## 2021-11-09 MED ORDER — GABAPENTIN 100 MG PO CAPS
100.0000 mg | ORAL_CAPSULE | Freq: Two times a day (BID) | ORAL | Status: DC
  Administered 2021-11-09 – 2021-11-16 (×15): 100 mg via ORAL
  Filled 2021-11-09 (×16): qty 1

## 2021-11-09 MED ORDER — DOXAZOSIN MESYLATE 2 MG PO TABS
2.0000 mg | ORAL_TABLET | Freq: Two times a day (BID) | ORAL | Status: DC
  Administered 2021-11-09 – 2021-11-16 (×15): 2 mg via ORAL
  Filled 2021-11-09 (×16): qty 1

## 2021-11-09 MED ORDER — ACETAMINOPHEN 500 MG PO TABS
1000.0000 mg | ORAL_TABLET | Freq: Once | ORAL | Status: AC
  Administered 2021-11-09: 1000 mg via ORAL
  Filled 2021-11-09: qty 2

## 2021-11-09 MED ORDER — SODIUM CHLORIDE 0.9 % IV SOLN: INTRAVENOUS | Status: DC

## 2021-11-09 MED ORDER — VITAMIN D 25 MCG (1000 UNIT) PO TABS
1000.0000 [IU] | ORAL_TABLET | Freq: Every day | ORAL | Status: DC
  Administered 2021-11-09 – 2021-11-16 (×8): 1000 [IU] via ORAL
  Filled 2021-11-09 (×8): qty 1

## 2021-11-09 MED ORDER — LEVOTHYROXINE SODIUM 100 MCG PO TABS
100.0000 ug | ORAL_TABLET | Freq: Every day | ORAL | Status: DC
  Administered 2021-11-09 – 2021-11-16 (×8): 100 ug via ORAL
  Filled 2021-11-09 (×8): qty 1

## 2021-11-09 MED ORDER — TRAZODONE HCL 50 MG PO TABS
50.0000 mg | ORAL_TABLET | Freq: Every evening | ORAL | Status: DC | PRN
  Administered 2021-11-11 – 2021-11-15 (×3): 50 mg via ORAL
  Filled 2021-11-09 (×3): qty 1

## 2021-11-09 MED ORDER — ONDANSETRON HCL 4 MG/2ML IJ SOLN: 4.0000 mg | Freq: Four times a day (QID) | INTRAMUSCULAR | Status: DC | PRN

## 2021-11-09 NOTE — ED Notes (Signed)
Unable to obtain second set of blood cultures prior to abx admin.

## 2021-11-09 NOTE — Plan of Care (Signed)
  Problem: Education: Goal: Knowledge of General Education information will improve Description Including pain rating scale, medication(s)/side effects and non-pharmacologic comfort measures Outcome: Progressing   

## 2021-11-09 NOTE — Assessment & Plan Note (Signed)
Stable

## 2021-11-09 NOTE — Assessment & Plan Note (Addendum)
Check TSH. Start gentle 0.9% infusion. Repeat BMP in AM. Hold HCTZ

## 2021-11-09 NOTE — Assessment & Plan Note (Signed)
Continue Synthroid °

## 2021-11-09 NOTE — Progress Notes (Signed)
PROGRESS NOTE                                                                                                                                                                                                             Patient Demographics:    Robert [name], is a 61 y.o. male, DOB - Sep 26, 1961, ZOX:[id number]  Outpatient Primary MD for the patient is [name], [name], MD    LOS - 0  Admit date - 11/08/2021    No chief complaint on file.      Brief Narrative (HPI from H&P) - 61 year old male with a history of renal transplant at [facility], severe obstructive sleep apnea, status post AVR in December 2022, status post CABG in December 2022, hypertension, type 2 diabetes on insulin, recent stroke requiring systemic anticoagulation with Coumadin due to concern for thrombus, presents to the ER from the nursing home today.  Patient started having severe right lower quadrant pain, he was admitted for pyelonephritis and a transplant kidney.   Subjective:    Robert [name] today has, No headache, No chest pain, much improved right lower quadrant abdominal pain - No Nausea, No new weakness tingling or numbness, no SOB.   Assessment  & Plan :    Acute pyelonephritis with right lower quadrant abdominal pain at the site of transplant kidney.  In a patient with immunocompromise, currently placed on Zosyn empirically in the ER which will be continued, follow cultures and adjust antibiotics accordingly, he does not appear toxic, will trend WBC count procalcitonin and blood cultures closely.  2.  History of kidney transplant at [facility].  For now home dose prednisone should suffice, does not appear toxic blood pressure stable, have consulted nephrology to monitor what transplant medications he needs here.  3.  AKI on CKD 3B.  Due to acute infection along with dehydration, also has hyponatremia.  Baseline creatinine around 2.5.  Hydrate, hold HCTZ and  ACE inhibitor and monitor with IV fluids, nephrology to consult.  4.  Hx of CABG and AVR in December 2022 by Dr. Caffie Pinto.  He is stable continue combination of high intensity statin and beta-blocker for secondary prevention also on Coumadin.  5.  HX of CVA.  Thought to be embolic.  On Coumadin, INR subtherapeutic for now Lovenox bridging pharmacy monitoring.  6.  Dyslipidemia.  On high intensity  statin.  7.  Hypothyroidism.  Continue Synthroid.  8. Obesity with OSA.  BMI of 37, follow with PCP for weight loss.  Nighttime BiPAP.  9. DM type II.  On Lantus and sliding scale monitor and adjust  Lab Results  Component Value Date   HGBA1C 7.1 (H) 10/03/2021   CBG (last 3)  Recent Labs    11/08/21 2319 11/09/21 0748  GLUCAP 254* 224*         Condition - Fair  Family Communication  :  None present  Code Status :  Full  Consults  :  Renal  PUD Prophylaxis : Pepcid   Procedures  :     CT - Right lower quadrant renal transplant without hydronephrosis. No evidence of bowel obstruction. Normal appendix. Left colonic diverticulosis, without evidence of diverticulitis. Cholelithiasis, without associated inflammatory changes. Small bilateral pleural effusions, unchanged.       Disposition Plan  :    Status is: Inpatient - pyelonephritis and transplant kidney  DVT Prophylaxis  :    SCDs Start: 11/09/21 0741 warfarin (COUMADIN) tablet 7.5 mg     Lab Results  Component Value Date   PLT 213 11/09/2021    Diet :  Diet Order             Diet renal/carb modified with fluid restriction Diet-HS Snack? Nothing; Fluid restriction: 2000 mL Fluid; Room service appropriate? Yes; Fluid consistency: Thin  Diet effective now                    Inpatient Medications  Scheduled Meds:  amiodarone  200 mg Oral BID   amLODipine  10 mg Oral QHS   carvedilol  25 mg Oral BID WC   doxazosin  2 mg Oral BID   DULoxetine  60 mg Oral Daily   enoxaparin (LOVENOX) injection  130 mg  Subcutaneous Q12H   insulin aspart  0-15 Units Subcutaneous TID WC   insulin aspart  0-5 Units Subcutaneous QHS   insulin aspart  4 Units Subcutaneous TID WC   insulin glargine-yfgn  15 Units Subcutaneous Daily   levothyroxine  100 mcg Oral QAC breakfast   mycophenolate  1,000 mg Oral Q12H   predniSONE  5 mg Oral Q breakfast   rosuvastatin  40 mg Oral Daily   warfarin  7.5 mg Oral ONCE-1600   Warfarin - Pharmacist Dosing Inpatient   Does not apply q1600   Continuous Infusions:  sodium chloride 50 mL/hr at 11/09/21 0758   piperacillin-tazobactam (ZOSYN)  IV 3.375 g (11/09/21 0759)   PRN Meds:.acetaminophen **OR** acetaminophen, albuterol, ALPRAZolam, ondansetron **OR** ondansetron (ZOFRAN) IV, traZODone  Antibiotics  :    Anti-infectives (From admission, onward)    Start     Dose/Rate Route Frequency Ordered Stop   11/09/21 0800  piperacillin-tazobactam (ZOSYN) IVPB 3.375 g        3.375 g 12.5 mL/hr over 240 Minutes Intravenous Every 8 hours 11/09/21 0441     11/09/21 0000  vancomycin (VANCOCIN) IVPB 1000 mg/200 mL premix  Status:  Discontinued        1,000 mg 200 mL/hr over 60 Minutes Intravenous  Once 11/08/21 2347 11/08/21 2350   11/09/21 0000  piperacillin-tazobactam (ZOSYN) IVPB 3.375 g        3.375 g 12.5 mL/hr over 240 Minutes Intravenous  Once 11/08/21 2348 11/09/21 0529   11/09/21 0000  vancomycin (VANCOREADY) IVPB 2000 mg/400 mL  Status:  Discontinued        2,000 mg  200 mL/hr over 120 Minutes Intravenous  Once 11/08/21 2351 11/09/21 0428        Time Spent in minutes  30   Lala Lund M.D on 11/09/2021 at 9:52 AM  To page go to [email or web address]   [facility] -  Office  [phone]  See all Orders from today for further details    Objective:   Vitals:   11/09/21 0600 11/09/21 0759 11/09/21 0800 11/09/21 0807  BP: (!) 142/60  (!) 149/65   Pulse: (!) 59  60   Resp: 15  17   Temp:  99 F (37.2 C)    TempSrc:  Oral    SpO2: 95%  97%   Weight:     121.8 kg  Height:    5\' 11"  (1.803 m)    Wt Readings from Last 3 Encounters:  11/09/21 121.8 kg  10/26/21 128.1 kg  10/18/21 130.5 kg    No intake or output data in the 24 hours ending 11/09/21 2505   Physical Exam  Awake Alert, No new F.N deficits, Normal affect [place].AT,PERRAL Supple Neck, No JVD,   Symmetrical Chest wall movement, Good air movement bilaterally, CTAB RRR,No Gallops, +ve systolic murmur +ve B.Sounds, Abd Soft, Mild RLQ tenderness No Cyanosis, Clubbing or edema      Data Review:    CBC Recent Labs  Lab 11/09/21 0000 11/09/21 0810  WBC 9.1 9.0  HGB 11.8* 11.1*  HCT 36.6* 35.0*  PLT 211 213  MCV 86.3 85.6  MCH 27.8 27.1  MCHC 32.2 31.7  RDW 14.6 14.6  LYMPHSABS 0.3* 0.4*  MONOABS 0.7 0.8  EOSABS 0.1 0.1  BASOSABS 0.0 0.0    Electrolytes Recent Labs  Lab 11/09/21 0000 11/09/21 0810  NA 128* 131*  K 4.1 4.0  CL 99 102  CO2 18* 20*  GLUCOSE 277* 241*  BUN 45* 43*  CREATININE 2.89* 2.78*  CALCIUM 9.8 9.5  AST 34 27  ALT 47* 43  ALKPHOS 99 93  BILITOT 1.0 0.8  ALBUMIN 2.9* 2.6*  MG  --  1.8  LATICACIDVEN 0.9  --   INR 1.3*  --     ------------------------------------------------------------------------------------------------------------------ No results for input(s): CHOL, HDL, LDLCALC, TRIG, CHOLHDL, LDLDIRECT in the last 72 hours.  Lab Results  Component Value Date   HGBA1C 7.1 (H) 10/03/2021    No results for input(s): TSH, T4TOTAL, T3FREE, THYROIDAB in the last 72 hours.  Invalid input(s): FREET3 ------------------------------------------------------------------------------------------------------------------ ID Labs Recent Labs  Lab 11/09/21 0000 11/09/21 0810  WBC 9.1 9.0  PLT 211 213  LATICACIDVEN 0.9  --   CREATININE 2.89* 2.78*   Cardiac Enzymes No results for input(s): CKMB, TROPONINI, MYOGLOBIN in the last 168 hours.  Invalid input(s): CK  Radiology Reports CT ABDOMEN PELVIS WO CONTRAST  Result Date:  11/09/2021 CLINICAL DATA:  Right lower quadrant abdominal pain, renal transplant EXAM: CT ABDOMEN AND PELVIS WITHOUT CONTRAST TECHNIQUE: Multidetector CT imaging of the abdomen and pelvis was performed following the standard protocol without IV contrast. RADIATION DOSE REDUCTION: This exam was performed according to the departmental dose-optimization program which includes automated exposure control, adjustment of the mA and/or kV according to patient size and/or use of iterative reconstruction technique. COMPARISON:  10/09/2021 FINDINGS: Lower chest: Small bilateral pleural effusions. Associated lower lobe atelectasis. This is grossly unchanged. Hepatobiliary: Unenhanced liver is unremarkable. Layering gallstones and/or gallbladder sludge (series 3/image 31), without associated laboratory changes. No intrahepatic or extrahepatic duct dilatation. Pancreas: Within normal limits. Spleen: Within normal limits. Adrenals/Urinary Tract:  Adrenal glands are within normal limits. Status post left nephrectomy. Mild right renal atrophy. No hydronephrosis. Right lower quadrant renal transplant without hydronephrosis. Bladder is mildly thick-walled although underdistended. Stomach/Bowel: Stomach is within normal limits. No evidence of bowel obstruction. Normal appendix (series 3/image 87). Left colonic diverticulosis, without evidence of diverticulitis. Vascular/Lymphatic: No evidence of abdominal aortic aneurysm. Atherosclerotic calcifications of the abdominal aorta and branch vessels. No suspicious abdominopelvic lymphadenopathy. Reproductive: Prostate is unremarkable. Other: No abdominopelvic ascites. Musculoskeletal: Degenerative changes of the visualized thoracolumbar spine, most prominent L1-2. IMPRESSION: Right lower quadrant renal transplant without hydronephrosis. No evidence of bowel obstruction. Normal appendix. Left colonic diverticulosis, without evidence of diverticulitis. Cholelithiasis, without associated  inflammatory changes. Small bilateral pleural effusions, unchanged. Electronically Signed   By: Julian Hy M.D.   On: 11/09/2021 01:36   DG Chest Port 1 View  Result Date: 11/09/2021 CLINICAL DATA:  Questionable sepsis. EXAM: PORTABLE CHEST 1 VIEW COMPARISON:  Chest x-ray 10/25/2021. FINDINGS: Sternotomy wires and prosthetic heart valve are again seen. The cardiomediastinal silhouette is enlarged unchanged. There is central pulmonary vascular congestion. There is no lung consolidation, pleural effusion or pneumothorax identified. No acute fractures are seen. IMPRESSION: 1. Cardiomegaly with central pulmonary vascular congestion. Electronically Signed   By: Ronney Asters M.D.   On: 11/09/2021 00:24

## 2021-11-09 NOTE — Assessment & Plan Note (Signed)
Patient states he has been having increasing blood pressures at the nursing home.  May need blood pressure medication adjustments.

## 2021-11-09 NOTE — Assessment & Plan Note (Signed)
Admit to medical telemetry bed. Continue IV zosyn. No need to vancomycin. Follow cultures.

## 2021-11-09 NOTE — Assessment & Plan Note (Signed)
Continue BiPAP.  

## 2021-11-09 NOTE — Assessment & Plan Note (Signed)
Continue prednisone, CellCept.  Patient states that he is due for his belatacept injection for his renal transplant.  We will need to ask pharmacy if this is available for him.

## 2021-11-09 NOTE — Assessment & Plan Note (Signed)
Continue Lantus.  Check A1c.  May need mealtime insulin in addition to sliding scale.

## 2021-11-09 NOTE — Assessment & Plan Note (Addendum)
Scr slightly worse than baseline. Hold HCTZ and lisinopril.

## 2021-11-09 NOTE — TOC Progression Note (Signed)
Transition of Care [facility]) - Initial/Assessment Note    Patient Details  Name: Robert [name] MRN: [id number] Date of Birth: 1960-11-27  Transition of Care [facility]) CM/SW Contact:    Milinda Antis, [place] Phone Number: 11/09/2021, 4:06 PM  Clinical Narrative:                  TOC following patient for any d/c planning needs once medically stable.  SNF consult acknowledged.  Pending PT/OT assessment and recommendations.     Lind Covert, MSW, LCSWA        Patient Goals and CMS Choice        Expected Discharge Plan and Services                                                Prior Living Arrangements/Services                       Activities of Daily Living      Permission Sought/Granted                  Emotional Assessment              Admission diagnosis:  Acute cystitis without hematuria [N30.00] Urinary tract infection with hematuria, site unspecified [N39.0, R31.9] Patient Active Problem List   Diagnosis Date Noted   Acute cystitis without hematuria 11/09/2021   Anticoagulated on Coumadin 11/09/2021   Hyponatremia 11/09/2021   Cerebrovascular accident (CVA) due to embolism of precerebral artery (HCC)    S/P AVR (aortic valve replacement) 10/05/2021   Exertional dyspnea    Fissure of skin 03/08/2020   Kidney transplant status 07/26/2017   Inguinal hernia without obstruction or gangrene 06/30/2017   Tremor of both hands 06/29/2017   Need for prophylactic immunotherapy 05/20/2017   Hydrocele, acquired 04/07/2017   Scrotal edema 03/14/2017   H/O diabetic neuropathy 03/01/2017   Immunosuppressed status ([place]) 02/26/2017   Prophylactic antibiotic 02/26/2017   Benign neoplasm of transverse colon    Diverticulosis of colon without hemorrhage    Morbid obesity ([place]) 02/03/2016   Type 2 diabetes mellitus with hyperglycemia, with long-term current use of insulin ([place]) 07/14/2015   HTN (hypertension) 05/11/2015   Chronic renal  failure    Anemia in chronic kidney disease 03/30/2015   Long term (current) use of insulin ([place]) 03/30/2015   Benign essential HTN 07/30/2014   Protein-calorie malnutrition, severe ([place]) 06/15/2014   Stage 3b chronic kidney disease (CKD) ([place]) 03/22/2014   Fatigue 01/08/2014   H/O unilateral nephrectomy 09/06/2013   Diabetes mellitus type 2 in obese ([place]) 09/06/2013   SVT (supraventricular tachycardia) ([place]) 09/06/2013   Lower extremity edema 09/06/2013   Severe obstructive sleep apnea 09/06/2013   Hypothyroidism 09/06/2013   Hyperlipidemia with target LDL less than 70 09/06/2013   PCP:  [name], [name], MD Pharmacy:   [id number] - [place], [place] - [address] AT [address]OSS [address] [place] [address] Phone: [phone] Fax: [phone]     Social Determinants of Health (SDOH) Interventions    Readmission Risk Interventions No flowsheet data found.

## 2021-11-09 NOTE — Consult Note (Signed)
Robert [name] Admit Date: 11/08/2021 11/09/2021 Robert [name]  Requesting Physician:  Dr. Candiss Norse Reason for Consult:  UTI in patient with Kidney Transplant HPI:  61 y.o. male with a PMH of renal transplant in 2018 at [facility], OSA, s/p AVR and CABG on 09/2021, HTN, HLD, TIIDM with insulin, hypothyroidism, and recent stroke on coumadin, presented to the ED from his SNF after experiencing 8-9/10 right lower abdominal pain the evening prior to presentation. It was additionally noted that the patient experienced a fever of 102 F. He endorsed nausea, only in the setting of receiving dilaudid, and RLQ pain, but denies any vomiting, headaches, congestions, chest pain, orthopnea, diarrhea, constipation, dysuria, or urinary frequency.  This morning, he states that his pain is "barely a 1" and was able to sleep through the night with his CPAP machine. He denies any overnight febrile events.    Creatinine, Ser (mg/dL)  Date Value  11/09/2021 2.78 (H)  11/09/2021 2.89 (H)  10/26/2021 2.39 (H)  10/25/2021 2.46 (H)  10/24/2021 2.04 (H)  10/24/2021 2.12 (H)  10/23/2021 2.17 (H)  10/22/2021 2.06 (H)  10/21/2021 2.03 (H)  10/15/2021 2.14 (H)  ] I/Os: No intake/output data recorded. Total I/O In: 48.2 [IV Piggyback:48.2] Out: -   Approximately 400 mL of yellow urine at bedside collected from condom cath.  PMH  Past Medical History:  Diagnosis Date   Anemia    low hgb at present   Aortic valve stenosis    Arthritis    FEET   Blood transfusion without reported diagnosis 1974   kidney removed - L   Cancer ([place])    skin ca right shoulder, plastic dsyplasia, pre-Ca polpys removed on Colonoscopy- 07/2014   Colon polyps 07/23/2014   Tubular adenomas x 5   Constipation    Coronary artery disease    Diabetes mellitus without complication ([place])    Type 2   Difficult intubation    unsure of actual problem but it was during the January 28, 2015 procedure.   Dysrhythmia    Eczema    ESRD (end stage  renal disease) ([place])    hemodialysis 06/2014-02/2017, s/p living unrelated kidney transplant 02/26/17   GI bleed 07/26/2017   Headache(784.0)    slight dull h/a due kidney failure   Heart murmur    aortic stenosis (moderate-severe 04/2021)   Hyperlipidemia    Hypertension    SVT h/o , followed by Dr. Claiborne Billings   Hypertensive urgency 10/21/2021   Hypothyroidism    Macular degeneration    Neuropathy    right hand and both feet   PAT (paroxysmal atrial tachycardia) ([place])    Peritoneal dialysis catheter in place [facility]) 02/24/2015   PSVT (paroxysmal supraventricular tachycardia) ([facility])    SBO (small bowel obstruction) ([place]) 05/2015   Shortness of breath    with exertion   Sleep apnea 06/18/2011   split-night sleep study- [facility]., BiPAP- 13-16   Thyroid disease    Umbilical hernia    s/p repair 04/28/15   PSH  Past Surgical History:  Procedure Laterality Date   AORTIC VALVE REPLACEMENT N/A 10/05/2021   Procedure: AORTIC VALVE REPLACEMENT (AVR) WITH INSPIRIS RESILIA AORTIC VALVE SIZE 27MM;  Surgeon: [name], MD;  Location: [place];  Service: Open Heart Surgery;  Laterality: N/A;   AV FISTULA PLACEMENT Right 02/25/2015   Procedure: RIGHT ARTERIOVENOUS (AV) FISTULA CREATION;  Surgeon: Serafina Mitchell, MD;  Location: [place];  Service: Vascular;  Laterality: Right;  AV FISTULA PLACEMENT Right 09/15/2015   Procedure: ARTERIOVENOUS (AV) FISTULA CREATION- RIGHT ARM;  Surgeon: Serafina Mitchell, MD;  Location: [place] OR;  Service: Vascular;  Laterality: Right;   CAPD INSERTION N/A 05/18/2014   Procedure: LAPAROSCOPIC INSERTION CONTINUOUS AMBULATORY PERITONEAL DIALYSIS  (CAPD) CATHETER;  Surgeon: Ralene Ok, MD;  Location: [place];  Service: General;  Laterality: N/A;   COLONOSCOPY     COLONOSCOPY WITH PROPOFOL N/A 07/12/2016   Procedure: COLONOSCOPY WITH PROPOFOL;  Surgeon: [name], MD;  Location: [facility];  Service: Endoscopy;  Laterality: N/A;   CORONARY ARTERY  BYPASS GRAFT N/A 10/05/2021   Procedure: CORONARY ARTERY BYPASS GRAFTING (CABG) X 1, ON PUMP, USING LEFT INTERNAL MAMMARY ARTERY CONDUIT;  Surgeon: [name], MD;  Location: [place];  Service: Open Heart Surgery;  Laterality: N/A;   declotting of fistula  08/2015   ESOPHAGOGASTRODUODENOSCOPY (EGD) WITH PROPOFOL N/A 07/12/2016   Procedure: ESOPHAGOGASTRODUODENOSCOPY (EGD) WITH PROPOFOL;  Surgeon: [name], MD;  [facility];  Service: Endoscopy;  Laterality: N/A;   FISTULA SUPERFICIALIZATION Right 02/07/2016   Procedure: RIIGHT UPPER ARM FISTULA SUPERFICIALIZATION;  Surgeon: Elam Dutch, MD;  Location: [place];  Service: Vascular;  Laterality: Right;   IJ catheter insertion     INSERTION OF DIALYSIS CATHETER Right 02/25/2015   Procedure: INSERTION OF RIGHT INTERNAL JUGULAR DIALYSIS CATHETER;  Surgeon: Serafina Mitchell, MD;  Location: [place] [facility];  Service: Vascular;  Laterality: Right;   KIDNEY TRANSPLANT     LAPAROSCOPIC REPOSITIONING CAPD CATHETER N/A 06/16/2014   Procedure: LAPAROSCOPIC REPOSITIONING CAPD CATHETER;  Surgeon: Ralene Ok, MD;  Location: [place];  Service: General;  Laterality: N/A;   LAPAROSCOPY N/A 05/04/2015   Procedure: LAPAROSCOPY DIAGNOSTIC LYSIS OF ADHESIONS;  Surgeon: Coralie Keens, MD;  Location: [place];  Service: General;  Laterality: N/A;   MINOR REMOVAL OF PERITONEAL DIALYSIS CATHETER N/A 04/28/2015   Procedure:  REMOVAL OF PERITONEAL DIALYSIS CATHETER;  Surgeon: Coralie Keens, MD;  Location: [place];  Service: General;  Laterality: N/A;   NEPHRECTOMY Left 1974   RENAL BIOPSY Right 2012   REVISON OF ARTERIOVENOUS FISTULA Right 05/26/2015   Procedure: SUPERFICIALIZATION OF ARTERIOVENOUS FISTULA WITH SIDE BRANCH LIGATIONS;  Surgeon: Serafina Mitchell, MD;  [facility];  Service: Vascular;  Laterality: Right;   RIGHT/LEFT HEART CATH AND CORONARY ANGIOGRAPHY N/A 08/22/2021   Procedure: RIGHT/LEFT HEART CATH AND CORONARY ANGIOGRAPHY;  Surgeon: Troy Sine, MD;  Location: [place] [facility];  Service: Cardiovascular;  Laterality: N/A;   SKIN CANCER EXCISION     right shoulder   SVT ABLATION N/A 11/23/2016   Procedure: SVT Ablation;  Surgeon: Will Meredith Leeds, MD;  Location: [place] [facility];  Service: Cardiovascular;  Laterality: N/A;   TEE WITHOUT CARDIOVERSION N/A 10/05/2021   Procedure: TRANSESOPHAGEAL ECHOCARDIOGRAM (TEE);  Surgeon: [name], MD;  Location: [place];  Service: Open Heart Surgery;  Laterality: N/A;   UMBILICAL HERNIA REPAIR N/A 05/18/2014   Procedure: HERNIA REPAIR UMBILICAL ADULT;  Surgeon: Ralene Ok, MD;  Location: [place];  Service: General;  Laterality: N/A;   UMBILICAL HERNIA REPAIR N/A 04/28/2015   Procedure: UMBILICAL HERNIA REPAIR WITH MESH;  Surgeon: Coralie Keens, MD;  Location: [place];  Service: General;  Laterality: N/A;   FH  Family History  Problem Relation Age of Onset   Colon polyps Father    Colon cancer Neg Hx    Stomach cancer Neg Hx    SH  reports  that he quit smoking about 8 years ago. His smoking use included cigars. He has never used smokeless tobacco. He reports current alcohol use. He reports that he does not use drugs. Allergies No Known Allergies Home medications Prior to Admission medications   Medication Sig Start Date End Date Taking? Authorizing Provider  acetaminophen (TYLENOL) 325 MG tablet Take 2 tablets (650 mg total) by mouth every 4 (four) hours as needed for moderate pain. 10/15/21  Yes Tacy Dura, Donielle M, PA-C  Albuterol Sulfate (PROAIR RESPICLICK IN) Inhale 2 puffs into the lungs See admin instructions. Tid x 3 days   Yes [provider]  ALPRAZolam (XANAX) 0.25 MG tablet Take 0.25 mg by mouth See admin instructions. Every 8 hours as needed for anxiety x 2 weeks   Yes [provider]  amiodarone (PACERONE) 200 MG tablet Take 1 tablet (200 mg total) by mouth 2 (two) times daily. 10/18/21  Yes Gold, Wayne E, PA-C  amLODipine (NORVASC) 10 MG  tablet TAKE 1 TABLET BY MOUTH EVERY DAY Patient taking differently: Take 10 mg by mouth daily. 09/11/21  Yes Troy Sine, MD  atorvastatin (LIPITOR) 80 MG tablet Take 1 tablet (80 mg total) by mouth daily. Patient taking differently: Take 80 mg by mouth every evening. 10/19/21  Yes Gold, Wayne E, PA-C  carvedilol (COREG) 25 MG tablet Take 1 tablet (25 mg total) by mouth 2 (two) times daily with a meal. 10/26/21  Yes Dahal, Marlowe Aschoff, MD  cholecalciferol (VITAMIN D3) 25 MCG (1000 UNIT) tablet Take 1,000 Units by mouth in the morning and at bedtime.   Yes [provider]  cloNIDine (CATAPRES) 0.1 MG tablet Take 0.1 mg by mouth every 8 (eight) hours as needed (SBP >160).   Yes [provider]  doxazosin (CARDURA) 2 MG tablet Take 1 tablet (2 mg total) by mouth daily. Patient taking differently: Take 2 mg by mouth 2 (two) times daily. 10/26/21  Yes Dahal, Marlowe Aschoff, MD  DULoxetine (CYMBALTA) 60 MG capsule Take 60 mg by mouth daily. 07/19/20  Yes [provider]  gabapentin (NEURONTIN) 100 MG capsule Take 100 mg by mouth 2 (two) times daily. 06/02/16  Yes [provider]  hydrochlorothiazide (MICROZIDE) 12.5 MG capsule Take 12.5 mg by mouth daily.   Yes [provider]  insulin aspart (NOVOLOG) 100 UNIT/ML injection Inject 0-9 Units into the skin 3 (three) times daily with meals. Patient taking differently: Inject 0-10 Units into the skin See admin instructions. 3 times daily before meals per sliding scale 0-150 = 0 units 151-199 = 2 units 200-249 = 4 units 250-299 = 6 units 300-349 = 8 units 350-399= 10 units Over 400 = call md 10/18/21  Yes Gold, Wayne E, PA-C  insulin glargine (LANTUS) 100 unit/mL SOPN Inject 10 Units into the skin daily. 10/26/21  Yes Dahal, Marlowe Aschoff, MD  levothyroxine (SYNTHROID) 100 MCG tablet TAKE 1 TABLET BY MOUTH EVERY DAY Patient taking differently: Take 100 mcg by mouth daily before breakfast. 06/13/21  Yes Elayne Snare, MD  lisinopril  (ZESTRIL) 40 MG tablet Take 1 tablet (40 mg total) by mouth daily. 10/19/21  Yes Gold, Wayne E, PA-C  mycophenolate (CELLCEPT) 250 MG capsule Take 1,000 mg by mouth every 12 (twelve) hours.    Yes [provider]  NON FORMULARY CPAP/BIPAP  at bedtime   Yes [provider]  predniSONE (DELTASONE) 5 MG tablet Take 5 mg by mouth daily with breakfast. Continuously   Yes [provider]  senna-docusate (SENOKOT-S) 8.6-50  MG tablet Take 1 tablet by mouth at bedtime as needed for mild constipation. 10/26/21  Yes Dahal, Marlowe Aschoff, MD  tamsulosin (FLOMAX) 0.4 MG CAPS capsule Take 0.8 mg by mouth at bedtime.    Yes [provider]  testosterone cypionate (DEPOTESTOSTERONE CYPIONATE) 200 MG/ML injection Inject 0.6 mLs (120 mg total) into the muscle every 7 (seven) days. Patient taking differently: Inject 120 mg into the muscle every Friday. 07/10/21  Yes Elayne Snare, MD  traZODone (DESYREL) 50 MG tablet Take 50 mg by mouth See admin instructions. As needed at bedtime for insomnia x 14 days   Yes [provider]  warfarin (COUMADIN) 5 MG tablet Take 5 mg by mouth daily at 6 PM.   Yes [provider]  belatacept (NULOJIX) 250 MG SOLR injection Inject 24.5 mLs into the vein every 28 (twenty-eight) days.  08/30/17   [provider]  enoxaparin (LOVENOX) 40 MG/0.4ML injection Inject 40 mg into the skin once. Patient not taking: Reported on 11/09/2021    [provider]  warfarin (COUMADIN) 2.5 MG tablet Take 1 tablet (2.5 mg total) by mouth daily at 4 PM. Patient not taking: Reported on 11/09/2021 10/18/21   John Giovanni, PA-C    Current Medications Scheduled Meds:  amiodarone  200 mg Oral BID   amLODipine  10 mg Oral QHS   carvedilol  25 mg Oral BID WC   cholecalciferol  1,000 Units Oral Daily   doxazosin  2 mg Oral BID   DULoxetine  60 mg Oral Daily   enoxaparin (LOVENOX) injection  130 mg Subcutaneous Q12H   famotidine  20 mg Oral Daily    gabapentin  100 mg Oral BID   insulin aspart  0-15 Units Subcutaneous TID WC   insulin aspart  0-5 Units Subcutaneous QHS   insulin aspart  4 Units Subcutaneous TID WC   insulin glargine-yfgn  15 Units Subcutaneous Daily   levothyroxine  100 mcg Oral QAC breakfast   mycophenolate  1,000 mg Oral Q12H   predniSONE  5 mg Oral Q breakfast   rosuvastatin  40 mg Oral Daily   tamsulosin  0.8 mg Oral QHS   warfarin  7.5 mg Oral ONCE-1600   Warfarin - Pharmacist Dosing Inpatient   Does not apply q1600   Continuous Infusions:  sodium chloride 75 mL/hr at 11/09/21 1101   piperacillin-tazobactam (ZOSYN)  IV Stopped (11/09/21 1159)   PRN Meds:.acetaminophen **OR** acetaminophen, albuterol, ALPRAZolam, hydrALAZINE, ondansetron **OR** ondansetron (ZOFRAN) IV, traZODone  CBC Recent Labs  Lab 11/09/21 0000 11/09/21 0810  WBC 9.1 9.0  NEUTROABS 7.9* 7.6  HGB 11.8* 11.1*  HCT 36.6* 35.0*  MCV 86.3 85.6  PLT 211 209   Basic Metabolic Panel Recent Labs  Lab 11/09/21 0000 11/09/21 0810  NA 128* 131*  K 4.1 4.0  CL 99 102  CO2 18* 20*  GLUCOSE 277* 241*  BUN 45* 43*  CREATININE 2.89* 2.78*  CALCIUM 9.8 9.5    Physical Exam   Blood pressure 135/66, pulse (!) 53, temperature 99 F (37.2 C), temperature source Oral, resp. rate 15, height 5\' 11"  (1.803 m), weight 121.8 kg, SpO2 97 %. Constitutional:      General: He is not in acute distress.    Appearance: He is not ill-appearing, toxic-appearing or diaphoretic.     Comments: Resting comfortably in bed on CPAP, awakens to vocal stimuli, fully conversant  Cardiovascular:     Rate and Rhythm: Normal rate and regular rhythm.  Pulses: Normal pulses.     Heart sounds: Murmur heard.    No friction rub. No gallop.     Comments: Systolic murmur present Pulmonary:     Effort: Pulmonary effort is normal.     Breath sounds: Normal breath sounds. No wheezing, rhonchi or rales.  Abdominal:     General: Abdomen is flat. Bowel sounds are  normal. There is distension.     Palpations: Abdomen is soft.     Tenderness: There is no abdominal tenderness. There is no guarding or rebound.     Comments: Distension secondary to habitus, BS+, no tenderness to palpation in all quadrants   Assessment # Acute Cystitis in a Transplanted Kidney:  Patient presenting with acute RLQ pain and fever of 102 F at facility with a Tmax of 101.37F in the ED. Since starting empiric zosyn, patient has had no fevering events. On examination, there was no tenderness to palpation over the site of the transplanted kidney. Would continue empiric therapy until culture result and treat for a total of 14 days. Continue with Cellcept and prednisone. Patient is due for Nulojix today, appreciate pharmacy reaching out to see if this is available. Patient appears to be improving. - Continue empiric therapy until cultures result, complete antibiotic course for 14 days.  - Continue Prednisone 5 mg daily  - Continue Cellcept 1,000 mg twice daily - Continue to monitor renal function  # AKI on CKD Stage 3b:  Patient with baseline of 2.39 with presenting with an AKI of 2.89, likely in the setting of dehydration and infection. BUN/Cr ration of 15.47. FeNa of 2.1% suggestive of intrinsic etiology, but patient is on HCTZ so this is likely inaccurate. urine urea pending.Fluids were administered overnight with mild improvement to 2.78. Patient had ~400 mL of yellow urine from his condom cath. Will continue with fluid resuscitation.  - Continue IV and PO fluid resuscitation  - Monitor renal function - Avoid nephrotoxic agents - Continue to hold home lisinopril and HCTZ  # Hyponatremia:  Presented with a serum Na of 128 which corrects to 132 due to glucose of 277. Sosm of 292, urine osm of 396, Urine Na of 67, and uric acid of 6.6. Patient appears euvolemic on physical examination, improvement to 134 in the presence of glucose of 241. Patient is currently asymptomatic. Continue to  hold HCTZ and fluid replete.  - Continue IV 75 mL/hr 0.9 saline.  - Continue to trend electrolytes.   # Hx of CABG and AVR 09/2021:  - Pharmacy managing Lovenox and warfarin - Continue Crestor 40 mg  - Continue Carvedilol 25 mg twice daily   # Hx of CVA:  On warfarin but subtherapeutic INR of 1.3, pharmacy managing Lovenox and warfarin.   # HLD:  - Continue Crestor 40 mg   # Hypothyroidism:  - Continue Synthroid 100 mcg  # Obesity with OSA:  - Continue CPAP  # T2DM:  - Continue Lantus and SSI  Robert Mercury, MD IMTS, PGY-3 11/09/2021, 1:11 PM

## 2021-11-09 NOTE — Assessment & Plan Note (Addendum)
Continue anticoagulation.  Patient and his brother Rodman Key state that they are unhappy with her current nursing home.  They desire to switch to a different facility.  We will need to involve TOC and Education officer, museum.

## 2021-11-09 NOTE — Progress Notes (Signed)
New Admission Note:   Arrival Method: stretcher  Mental Orientation: Alert and oriented  Telemetry:box 11  Assessment: Completed Skin: Intact  IV: Right forearm  Pain: Tubes: Safety Measures: Safety Fall Prevention Plan has been given, discussed and signed Admission: Completed [facility] Orientation: Patient has been orientated to the room, unit and staff.  Family:  Orders have been reviewed and implemented. Will continue to monitor the patient. Call light has been placed within reach and bed alarm has been activated.   Spyros Winch RN [facility] Phone: [phone]

## 2021-11-09 NOTE — Assessment & Plan Note (Signed)
Chronic. 

## 2021-11-09 NOTE — Assessment & Plan Note (Signed)
INR subtherapeutic.  Patient states that he is on Lovenox bridging at the nursing home.  We will ask pharmacy to dose his Lovenox and his Coumadin.  Patient was on Coumadin after his stroke.

## 2021-11-09 NOTE — Progress Notes (Signed)
ANTICOAGULATION/ANTIMICROBIAL  CONSULT NOTE - Initial Consult  Pharmacy Consult for Lovenox/Coumadin and Zosyn Indication:  AVR/CVA and cystitis  No Known Allergies  Vital Signs: Temp: 101.1 F (38.4 C) (02/02 0033) Temp Source: Rectal (02/02 0033) BP: 144/61 (02/02 0300) Pulse Rate: 65 (02/02 0300)  Labs: Recent Labs    11/09/21 0000  HGB 11.8*  HCT 36.6*  PLT 211  APTT 33  LABPROT 16.3*  INR 1.3*  CREATININE 2.89*    Estimated Creatinine Clearance: 36.1 mL/min (A) (by C-G formula based on SCr of 2.89 mg/dL (H)).   Medical History: Past Medical History:  Diagnosis Date   Anemia    low hgb at present   Aortic valve stenosis    Arthritis    FEET   Blood transfusion without reported diagnosis 1974   kidney removed - L   Cancer ([place])    skin ca right shoulder, plastic dsyplasia, pre-Ca polpys removed on Colonoscopy- 07/2014   Colon polyps 07/23/2014   Tubular adenomas x 5   Constipation    Coronary artery disease    Diabetes mellitus without complication ([place])    Type 2   Difficult intubation    unsure of actual problem but it was during the January 28, 2015 procedure.   Dysrhythmia    Eczema    ESRD (end stage renal disease) ([place])    hemodialysis 06/2014-02/2017, s/p living unrelated kidney transplant 02/26/17   GI bleed 07/26/2017   Headache(784.0)    slight dull h/a due kidney failure   Heart murmur    aortic stenosis (moderate-severe 04/2021)   Hyperlipidemia    Hypertension    SVT h/o , followed by Dr. Claiborne Billings   Hypertensive urgency 10/21/2021   Hypothyroidism    Macular degeneration    Neuropathy    right hand and both feet   PAT (paroxysmal atrial tachycardia) ([place])    Peritoneal dialysis catheter in place [facility]) 02/24/2015   PSVT (paroxysmal supraventricular tachycardia) ([facility])    SBO (small bowel obstruction) ([place]) 05/2015   Shortness of breath    with exertion   Sleep apnea 06/18/2011   split-night sleep study- [facility].,  BiPAP- 13-16   Thyroid disease    Umbilical hernia    s/p repair 04/28/15    Assessment: 61yo male admitted for IV ABX for cystitis in setting of immunosuppression (h/o kidney transplant), also to continue Coumadin for AVR and h/o stroke with Lovenox bridge; of note pt was reportedly on Lovenox "bridge" at [facility] but per SNF MAR pt rec'd only one dose of Lovenox 40mg ; last dose of Coumadin taken 2/1.  Goal of Therapy:  INR 2-2.5 Anti-Xa level 0.6-1 units/ml 4hrs after LMWH dose given Monitor platelets by anticoagulation protocol: Yes   Plan:  Lovenox 130mg  SQ Q12H. Coumadin 7.5mg  PO x1 today and monitor INR for dose adjustments. Zosyn 3.375g IV Q8H (extended infusion). Monitor CBC, Cx.  Wynona Neat, PharmD, BCPS  11/09/2021,4:23 AM

## 2021-11-09 NOTE — H&P (Addendum)
History and Physical    Robert [name] DPO:[id number] DOB: 1961/08/24 DOA: 11/08/2021  DOS: the patient was seen and examined on 11/08/2021  PCP: Haywood Pao, MD   Patient coming from: SNF [facility]  I have personally briefly reviewed patient's old medical records in [place]  CC: abd pain HPI: 61 year old male with a history of renal transplant, severe obstructive sleep apnea, status post AVR in December 2022, status post CABG in December 2022, hypertension, type 2 diabetes on insulin, recent stroke requiring systemic anticoagulation with Coumadin due to concern for thrombus, presents to the ER from the nursing home today.  Patient started having severe right lower quadrant pain.  Pain started approximately 9:30 PM.  Patient also had a fever of 102 earlier today at the nursing home.  Positive nausea but no vomiting.  He denies any dysuria.  On arrival to the ER, he was febrile to one 1.1.  Blood pressure initially 172/71.  Sats 95% on room air.  Labs: White count 9.1, hemoglobin 11.9, platelets 211 Chemistry sodium 128, potassium 4.1, bicarb 18, BUN 45, creatinine 2.89.  INR 1.3  UA demonstrated many bacteria, positive leukocyte Estrace  CT abdomen without contrast showed right lower quadrant renal transplant without hydronephrosis.  Normal appendix.  Cholelithiasis without any associated inflammatory changes.  Due to the patient's UTI, fever and immunosuppressed status, [facility] contacted for admission.  During the interview, the patient and his brother Rodman Key both stated that they were unhappy with their current nursing home in the care they have been receiving there.  They desire to switch to a different facility.   ED Course: UA positive for UTI, CT abd negative. CXR negative  Review of Systems:  Review of Systems  Constitutional:  Positive for fever and malaise/fatigue.  HENT: Negative.    Eyes: Negative.   Respiratory:  Negative.    Cardiovascular: Negative.   Gastrointestinal:  Positive for abdominal pain and nausea. Negative for vomiting.  Genitourinary:  Negative for dysuria and urgency.  Musculoskeletal: Negative.   Skin: Negative.   Neurological:  Positive for weakness.  Endo/Heme/Allergies: Negative.   Psychiatric/Behavioral: Negative.    All other systems reviewed and are negative.  Past Medical History:  Diagnosis Date   Anemia    low hgb at present   Aortic valve stenosis    Arthritis    FEET   Blood transfusion without reported diagnosis 1974   kidney removed - L   Cancer ([place])    skin ca right shoulder, plastic dsyplasia, pre-Ca polpys removed on Colonoscopy- 07/2014   Colon polyps 07/23/2014   Tubular adenomas x 5   Constipation    Coronary artery disease    Diabetes mellitus without complication ([place])    Type 2   Difficult intubation    unsure of actual problem but it was during the January 28, 2015 procedure.   Dysrhythmia    Eczema    ESRD (end stage renal disease) ([place])    hemodialysis 06/2014-02/2017, s/p living unrelated kidney transplant 02/26/17   GI bleed 07/26/2017   Headache(784.0)    slight dull h/a due kidney failure   Heart murmur    aortic stenosis (moderate-severe 04/2021)   Hyperlipidemia    Hypertension    SVT h/o , followed by Dr. Claiborne Billings   Hypertensive urgency 10/21/2021   Hypothyroidism    Macular degeneration    Neuropathy    right hand and both feet   PAT (paroxysmal atrial tachycardia) ([place])  Peritoneal dialysis catheter in place [facility] 02/24/2015   PSVT (paroxysmal supraventricular tachycardia) (H[facility])    SBO (small bowel obstruction) ([place]) 05/2015   Shortness of breath    with exertion   Sleep apnea 06/18/2011   split-night sleep study- [facility]., BiPAP- 13-16   Thyroid disease    Umbilical hernia    s/p repair 04/28/15    Past Surgical History:  Procedure Laterality Date   AORTIC VALVE REPLACEMENT N/A 10/05/2021    Procedure: AORTIC VALVE REPLACEMENT (AVR) WITH INSPIRIS RESILIA AORTIC VALVE SIZE 27MM;  Surgeon: [name], MD;  Location: [place];  Service: Open Heart Surgery;  Laterality: N/A;   AV FISTULA PLACEMENT Right 02/25/2015   Procedure: RIGHT ARTERIOVENOUS (AV) FISTULA CREATION;  Surgeon: Serafina Mitchell, MD;  Location: [place];  Service: Vascular;  Laterality: Right;   AV FISTULA PLACEMENT Right 09/15/2015   Procedure: ARTERIOVENOUS (AV) FISTULA CREATION- RIGHT ARM;  Surgeon: Serafina Mitchell, MD;  Location: [place];  Service: Vascular;  Laterality: Right;   CAPD INSERTION N/A 05/18/2014   Procedure: LAPAROSCOPIC INSERTION CONTINUOUS AMBULATORY PERITONEAL DIALYSIS  (CAPD) CATHETER;  Surgeon: Ralene Ok, MD;  Location: [place];  Service: General;  Laterality: N/A;   COLONOSCOPY     COLONOSCOPY WITH PROPOFOL N/A 07/12/2016   Procedure: COLONOSCOPY WITH PROPOFOL;  Surgeon: [name], MD;  Location: [facility];  Service: Endoscopy;  Laterality: N/A;   CORONARY ARTERY BYPASS GRAFT N/A 10/05/2021   Procedure: CORONARY ARTERY BYPASS GRAFTING (CABG) X 1, ON PUMP, USING LEFT INTERNAL MAMMARY ARTERY CONDUIT;  Surgeon: [name], MD;  Location: [place];  Service: Open Heart Surgery;  Laterality: N/A;   declotting of fistula  08/2015   ESOPHAGOGASTRODUODENOSCOPY (EGD) WITH PROPOFOL N/A 07/12/2016   Procedure: ESOPHAGOGASTRODUODENOSCOPY (EGD) WITH PROPOFOL;  Surgeon: [name], MD;  [facility];  Service: Endoscopy;  Laterality: N/A;   FISTULA SUPERFICIALIZATION Right 02/07/2016   Procedure: RIIGHT UPPER ARM FISTULA SUPERFICIALIZATION;  Surgeon: Elam Dutch, MD;  Location: [place];  Service: Vascular;  Laterality: Right;   IJ catheter insertion     INSERTION OF DIALYSIS CATHETER Right 02/25/2015   Procedure: INSERTION OF RIGHT INTERNAL JUGULAR DIALYSIS CATHETER;  Surgeon: Serafina Mitchell, MD;  Location: [place] [facility];  Service: Vascular;  Laterality: Right;   KIDNEY TRANSPLANT      LAPAROSCOPIC REPOSITIONING CAPD CATHETER N/A 06/16/2014   Procedure: LAPAROSCOPIC REPOSITIONING CAPD CATHETER;  Surgeon: Ralene Ok, MD;  Location: [place];  Service: General;  Laterality: N/A;   LAPAROSCOPY N/A 05/04/2015   Procedure: LAPAROSCOPY DIAGNOSTIC LYSIS OF ADHESIONS;  Surgeon: Coralie Keens, MD;  Location: [place];  Service: General;  Laterality: N/A;   MINOR REMOVAL OF PERITONEAL DIALYSIS CATHETER N/A 04/28/2015   Procedure:  REMOVAL OF PERITONEAL DIALYSIS CATHETER;  Surgeon: Coralie Keens, MD;  Location: [place];  Service: General;  Laterality: N/A;   NEPHRECTOMY Left 1974   RENAL BIOPSY Right 2012   REVISON OF ARTERIOVENOUS FISTULA Right 05/26/2015   Procedure: SUPERFICIALIZATION OF ARTERIOVENOUS FISTULA WITH SIDE BRANCH LIGATIONS;  Surgeon: Serafina Mitchell, MD;  [facility];  Service: Vascular;  Laterality: Right;   RIGHT/LEFT HEART CATH AND CORONARY ANGIOGRAPHY N/A 08/22/2021   Procedure: RIGHT/LEFT HEART CATH AND CORONARY ANGIOGRAPHY;  Surgeon: Troy Sine, MD;  Location: [place] [facility];  Service: Cardiovascular;  Laterality: N/A;   SKIN CANCER EXCISION     right shoulder   SVT ABLATION N/A 11/23/2016   Procedure:  SVT Ablation;  Surgeon: Will Meredith Leeds, MD;  Location: [name] [facility];  Service: Cardiovascular;  Laterality: N/A;   TEE WITHOUT CARDIOVERSION N/A 10/05/2021   Procedure: TRANSESOPHAGEAL ECHOCARDIOGRAM (TEE);  Surgeon: [name], MD;  Location: [place];  Service: Open Heart Surgery;  Laterality: N/A;   UMBILICAL HERNIA REPAIR N/A 05/18/2014   Procedure: HERNIA REPAIR UMBILICAL ADULT;  Surgeon: Ralene Ok, MD;  Location: [place];  Service: General;  Laterality: N/A;   UMBILICAL HERNIA REPAIR N/A 04/28/2015   Procedure: UMBILICAL HERNIA REPAIR WITH MESH;  Surgeon: Coralie Keens, MD;  Location: [place];  Service: General;  Laterality: N/A;     reports that he quit smoking about 8 years ago. His smoking use included cigars. He has never used  smokeless tobacco. He reports current alcohol use. He reports that he does not use drugs.  No Known Allergies  Family History  Problem Relation Age of Onset   Colon polyps Father    Colon cancer Neg Hx    Stomach cancer Neg Hx     Prior to Admission medications   Medication Sig Start Date End Date Taking? Authorizing Provider  acetaminophen (TYLENOL) 325 MG tablet Take 2 tablets (650 mg total) by mouth every 4 (four) hours as needed for moderate pain. 10/15/21  Yes Tacy Dura, Donielle M, PA-C  Albuterol Sulfate (PROAIR RESPICLICK IN) Inhale 2 puffs into the lungs See admin instructions. Tid x 3 days   Yes [provider]  ALPRAZolam (XANAX) 0.25 MG tablet Take 0.25 mg by mouth See admin instructions. Every 8 hours as needed for anxiety x 2 weeks   Yes [provider]  amiodarone (PACERONE) 200 MG tablet Take 1 tablet (200 mg total) by mouth 2 (two) times daily. 10/18/21  Yes Gold, Wayne E, PA-C  amLODipine (NORVASC) 10 MG tablet TAKE 1 TABLET BY MOUTH EVERY DAY Patient taking differently: Take 10 mg by mouth daily. 09/11/21  Yes Troy Sine, MD  atorvastatin (LIPITOR) 80 MG tablet Take 1 tablet (80 mg total) by mouth daily. Patient taking differently: Take 80 mg by mouth every evening. 10/19/21  Yes Gold, Wayne E, PA-C  carvedilol (COREG) 25 MG tablet Take 1 tablet (25 mg total) by mouth 2 (two) times daily with a meal. 10/26/21  Yes Dahal, Marlowe Aschoff, MD  cholecalciferol (VITAMIN D3) 25 MCG (1000 UNIT) tablet Take 1,000 Units by mouth in the morning and at bedtime.   Yes [provider]  cloNIDine (CATAPRES) 0.1 MG tablet Take 0.1 mg by mouth every 8 (eight) hours as needed (SBP >160).   Yes [provider]  doxazosin (CARDURA) 2 MG tablet Take 1 tablet (2 mg total) by mouth daily. Patient taking differently: Take 2 mg by mouth 2 (two) times daily. 10/26/21  Yes Dahal, Marlowe Aschoff, MD  DULoxetine (CYMBALTA) 60 MG capsule Take 60 mg by mouth daily. 07/19/20  Yes  [provider]  gabapentin (NEURONTIN) 100 MG capsule Take 100 mg by mouth 2 (two) times daily. 06/02/16  Yes [provider]  hydrochlorothiazide (MICROZIDE) 12.5 MG capsule Take 12.5 mg by mouth daily.   Yes [provider]  insulin aspart (NOVOLOG) 100 UNIT/ML injection Inject 0-9 Units into the skin 3 (three) times daily with meals. Patient taking differently: Inject 0-10 Units into the skin See admin instructions. 3 times daily before meals per sliding scale 0-150 = 0 units 151-199 = 2 units 200-249 = 4 units 250-299 = 6 units 300-349 = 8 units 350-399= 10  units Over 400 = call md 10/18/21  Yes Gold, Wayne E, PA-C  insulin glargine (LANTUS) 100 unit/mL SOPN Inject 10 Units into the skin daily. 10/26/21  Yes Dahal, Marlowe Aschoff, MD  levothyroxine (SYNTHROID) 100 MCG tablet TAKE 1 TABLET BY MOUTH EVERY DAY Patient taking differently: Take 100 mcg by mouth daily before breakfast. 06/13/21  Yes Elayne Snare, MD  lisinopril (ZESTRIL) 40 MG tablet Take 1 tablet (40 mg total) by mouth daily. 10/19/21  Yes Gold, Wayne E, PA-C  mycophenolate (CELLCEPT) 250 MG capsule Take 1,000 mg by mouth every 12 (twelve) hours.    Yes [provider]  NON FORMULARY CPAP/BIPAP  at bedtime   Yes [provider]  predniSONE (DELTASONE) 5 MG tablet Take 5 mg by mouth daily with breakfast. Continuously   Yes [provider]  senna-docusate (SENOKOT-S) 8.6-50 MG tablet Take 1 tablet by mouth at bedtime as needed for mild constipation. 10/26/21  Yes Dahal, Marlowe Aschoff, MD  tamsulosin (FLOMAX) 0.4 MG CAPS capsule Take 0.8 mg by mouth at bedtime.    Yes [provider]  testosterone cypionate (DEPOTESTOSTERONE CYPIONATE) 200 MG/ML injection Inject 0.6 mLs (120 mg total) into the muscle every 7 (seven) days. Patient taking differently: Inject 120 mg into the muscle every Friday. 07/10/21  Yes Elayne Snare, MD  traZODone (DESYREL) 50 MG tablet Take 50 mg by mouth See admin  instructions. As needed at bedtime for insomnia x 14 days   Yes [provider]  warfarin (COUMADIN) 5 MG tablet Take 5 mg by mouth daily at 6 PM.   Yes [provider]  belatacept (NULOJIX) 250 MG SOLR injection Inject 24.5 mLs into the vein every 28 (twenty-eight) days.  08/30/17   [provider]  enoxaparin (LOVENOX) 40 MG/0.4ML injection Inject 40 mg into the skin once. Patient not taking: Reported on 11/09/2021    [provider]  warfarin (COUMADIN) 2.5 MG tablet Take 1 tablet (2.5 mg total) by mouth daily at 4 PM. Patient not taking: Reported on 11/09/2021 10/18/21   John Giovanni, PA-C    Physical Exam: Vitals:   11/09/21 0045 11/09/21 0100 11/09/21 0245 11/09/21 0300  BP: (!) 181/69 (!) 180/70 (!) 146/59 (!) 144/61  Pulse: 73 73 67 65  Resp: 14 20 14 14   Temp:      TempSrc:      SpO2: 97% 95% 95% 96%    Physical Exam Vitals and nursing note reviewed.  Constitutional:      General: He is not in acute distress.    Appearance: He is obese. He is not ill-appearing, toxic-appearing or diaphoretic.  HENT:     Head: Normocephalic and atraumatic.     Nose: Nose normal.  Eyes:     General: No scleral icterus. Cardiovascular:     Rate and Rhythm: Normal rate and regular rhythm.     Pulses: Normal pulses.     Heart sounds: Murmur heard.  Pulmonary:     Effort: Pulmonary effort is normal. No respiratory distress.     Breath sounds: Normal breath sounds. No wheezing or rales.  Abdominal:     General: Abdomen is protuberant. Bowel sounds are normal. There is no distension.     Palpations: Abdomen is soft.     Tenderness: There is no abdominal tenderness. There is no guarding or rebound.  Musculoskeletal:     Right lower leg: 1+ Edema present.  Skin:    General: Skin is warm and dry.  Capillary Refill: Capillary refill takes less than 2 seconds.  Neurological:     General: No focal deficit present.     Mental Status: He is alert and  oriented to person, place, and time.     Labs on Admission: I have personally reviewed following labs and imaging studies  CBC: Recent Labs  Lab 11/09/21 0000  WBC 9.1  NEUTROABS 7.9*  HGB 11.8*  HCT 36.6*  MCV 86.3  PLT 073   Basic Metabolic Panel: Recent Labs  Lab 11/09/21 0000  NA 128*  K 4.1  CL 99  CO2 18*  GLUCOSE 277*  BUN 45*  CREATININE 2.89*  CALCIUM 9.8   GFR: Estimated Creatinine Clearance: 36.1 mL/min (A) (by C-G formula based on SCr of 2.89 mg/dL (H)). Liver Function Tests: Recent Labs  Lab 11/09/21 0000  AST 34  ALT 47*  ALKPHOS 99  BILITOT 1.0  PROT 5.7*  ALBUMIN 2.9*   No results for input(s): LIPASE, AMYLASE in the last 168 hours. No results for input(s): AMMONIA in the last 168 hours. Coagulation Profile: Recent Labs  Lab 11/09/21 0000  INR 1.3*   Cardiac Enzymes: No results for input(s): CKTOTAL, CKMB, CKMBINDEX, TROPONINI in the last 168 hours. BNP (last 3 results) No results for input(s): PROBNP in the last 8760 hours. HbA1C: No results for input(s): HGBA1C in the last 72 hours. CBG: Recent Labs  Lab 11/08/21 2319  GLUCAP 254*   Lipid Profile: No results for input(s): CHOL, HDL, LDLCALC, TRIG, CHOLHDL, LDLDIRECT in the last 72 hours. Thyroid Function Tests: No results for input(s): TSH, T4TOTAL, FREET4, T3FREE, THYROIDAB in the last 72 hours. Anemia Panel: No results for input(s): VITAMINB12, FOLATE, FERRITIN, TIBC, IRON, RETICCTPCT in the last 72 hours. Urine analysis:    Component Value Date/Time   COLORURINE YELLOW 11/09/2021 0210   APPEARANCEUR CLOUDY (A) 11/09/2021 0210   LABSPEC 1.020 11/09/2021 0210   PHURINE 5.5 11/09/2021 0210   GLUCOSEU 250 (A) 11/09/2021 0210   GLUCOSEU NEGATIVE 08/03/2019 1006   HGBUR LARGE (A) 11/09/2021 0210   BILIRUBINUR NEGATIVE 11/09/2021 0210   KETONESUR NEGATIVE 11/09/2021 0210   PROTEINUR 100 (A) 11/09/2021 0210   UROBILINOGEN 0.2 08/03/2019 1006   NITRITE NEGATIVE 11/09/2021  0210   LEUKOCYTESUR MODERATE (A) 11/09/2021 0210    Radiological Exams on Admission: I have personally reviewed images CT ABDOMEN PELVIS WO CONTRAST  Result Date: 11/09/2021 CLINICAL DATA:  Right lower quadrant abdominal pain, renal transplant EXAM: CT ABDOMEN AND PELVIS WITHOUT CONTRAST TECHNIQUE: Multidetector CT imaging of the abdomen and pelvis was performed following the standard protocol without IV contrast. RADIATION DOSE REDUCTION: This exam was performed according to the departmental dose-optimization program which includes automated exposure control, adjustment of the mA and/or kV according to patient size and/or use of iterative reconstruction technique. COMPARISON:  10/09/2021 FINDINGS: Lower chest: Small bilateral pleural effusions. Associated lower lobe atelectasis. This is grossly unchanged. Hepatobiliary: Unenhanced liver is unremarkable. Layering gallstones and/or gallbladder sludge (series 3/image 31), without associated laboratory changes. No intrahepatic or extrahepatic duct dilatation. Pancreas: Within normal limits. Spleen: Within normal limits. Adrenals/Urinary Tract: Adrenal glands are within normal limits. Status post left nephrectomy. Mild right renal atrophy. No hydronephrosis. Right lower quadrant renal transplant without hydronephrosis. Bladder is mildly thick-walled although underdistended. Stomach/Bowel: Stomach is within normal limits. No evidence of bowel obstruction. Normal appendix (series 3/image 87). Left colonic diverticulosis, without evidence of diverticulitis. Vascular/Lymphatic: No evidence of abdominal aortic aneurysm. Atherosclerotic calcifications of the abdominal aorta and branch vessels.  No suspicious abdominopelvic lymphadenopathy. Reproductive: Prostate is unremarkable. Other: No abdominopelvic ascites. Musculoskeletal: Degenerative changes of the visualized thoracolumbar spine, most prominent L1-2. IMPRESSION: Right lower quadrant renal transplant without  hydronephrosis. No evidence of bowel obstruction. Normal appendix. Left colonic diverticulosis, without evidence of diverticulitis. Cholelithiasis, without associated inflammatory changes. Small bilateral pleural effusions, unchanged. Electronically Signed   By: Julian Hy M.D.   On: 11/09/2021 01:36   DG Chest Port 1 View  Result Date: 11/09/2021 CLINICAL DATA:  Questionable sepsis. EXAM: PORTABLE CHEST 1 VIEW COMPARISON:  Chest x-ray 10/25/2021. FINDINGS: Sternotomy wires and prosthetic heart valve are again seen. The cardiomediastinal silhouette is enlarged unchanged. There is central pulmonary vascular congestion. There is no lung consolidation, pleural effusion or pneumothorax identified. No acute fractures are seen. IMPRESSION: 1. Cardiomegaly with central pulmonary vascular congestion. Electronically Signed   By: Ronney Asters M.D.   On: 11/09/2021 00:24    EKG: I have personally reviewed EKG: NSR   Assessment/Plan Principal Problem:   Acute cystitis without hematuria Active Problems:   Kidney transplant status   Anticoagulated on Coumadin   Severe obstructive sleep apnea   Hypothyroidism   Stage 3b chronic kidney disease (CKD) (HCC)   Type 2 diabetes mellitus with hyperglycemia, with long-term current use of insulin (HCC)   Morbid obesity (HCC)   S/P AVR (aortic valve replacement)   Cerebrovascular accident (CVA) due to embolism of precerebral artery (HCC)   Anemia in chronic kidney disease   Benign essential HTN   Hyponatremia    Assessment and Plan: * Acute cystitis without hematuria- (present on admission) Admit to medical telemetry bed. Continue IV zosyn. No need to vancomycin. Follow cultures.  Anticoagulated on Coumadin INR subtherapeutic.  Patient states that he is on Lovenox bridging at the nursing home.  We will ask pharmacy to dose his Lovenox and his Coumadin.  Patient was on Coumadin after his stroke.  Kidney transplant status Continue prednisone,  CellCept.  Patient states that he is due for his belatacept injection for his renal transplant.  We will need to ask pharmacy if this is available for him.  Hyponatremia Check TSH. Start gentle 0.9% infusion. Repeat BMP in AM. Hold HCTZ  Benign essential HTN- (present on admission) Patient states he has been having increasing blood pressures at the nursing home.  May need blood pressure medication adjustments.  Anemia in chronic kidney disease- (present on admission) Stable.  Cerebrovascular accident (CVA) due to embolism of precerebral artery ([place])- (present on admission) Continue anticoagulation.  Patient and his brother Rodman Key state that they are unhappy with her current nursing home.  They desire to switch to a different facility.  We will need to involve TOC and Education officer, museum.  S/P AVR (aortic valve replacement) Stable.  Morbid obesity ([place])- (present on admission) Chronic.  Type 2 diabetes mellitus with hyperglycemia, with long-term current use of insulin (HCC) Continue Lantus.  Check A1c.  May need mealtime insulin in addition to sliding scale.  Stage 3b chronic kidney disease (CKD) ([place])- (present on admission) Scr slightly worse than baseline. Hold HCTZ and lisinopril.  Hypothyroidism- (present on admission) Continue Synthroid.  Severe obstructive sleep apnea- (present on admission) Continue BiPAP.    DVT prophylaxis: Lovenox, coumadin Code Status: Full Code Family Communication: discussed with pt and pt's brother matthew at bedside  Disposition Plan: return to SNF. Maybe different one at discharge  Consults called: none  Admission status: Inpatient, Telemetry bed   Kristopher Oppenheim, DO [facility] 11/09/2021, 3:55 AM

## 2021-11-09 NOTE — Progress Notes (Signed)
Patient on home CPAP machine.  RT assistance not needed at this time. °

## 2021-11-09 NOTE — Subjective & Objective (Signed)
CC: abd pain HPI: 61 year old male with a history of renal transplant, severe obstructive sleep apnea, status post AVR in December 2022, status post CABG in December 2022, hypertension, type 2 diabetes on insulin, recent stroke requiring systemic anticoagulation with Coumadin due to concern for thrombus, presents to the ER from the nursing home today.  Patient started having severe right lower quadrant pain.  Pain started approximately 9:30 PM.  Patient also had a fever of 102 earlier today at the nursing home.  Positive nausea but no vomiting.  He denies any dysuria.  On arrival to the ER, he was febrile to one 1.1.  Blood pressure initially 172/71.  Sats 95% on room air.  Labs: White count 9.1, hemoglobin 11.9, platelets 211 Chemistry sodium 128, potassium 4.1, bicarb 18, BUN 45, creatinine 2.89.  INR 1.3  UA demonstrated many bacteria, positive leukocyte Estrace  CT abdomen without contrast showed right lower quadrant renal transplant without hydronephrosis.  Normal appendix.  Cholelithiasis without any associated inflammatory changes.  Due to the patient's UTI, fever and immunosuppressed status, [facility] hospitalist contacted for admission.  During the interview, the patient and his brother Rodman Key both stated that they were unhappy with their current nursing home in the care they have been receiving there.  They desire to switch to a different facility.

## 2021-11-10 LAB — COMPREHENSIVE METABOLIC PANEL
ALT: 44 U/L (ref 0–44)
AST: 27 U/L (ref 15–41)
Albumin: 2.8 g/dL — ABNORMAL LOW (ref 3.5–5.0)
Alkaline Phosphatase: 90 U/L (ref 38–126)
Anion gap: 9 (ref 5–15)
BUN: 45 mg/dL — ABNORMAL HIGH (ref 8–23)
CO2: 20 mmol/L — ABNORMAL LOW (ref 22–32)
Calcium: 9.8 mg/dL (ref 8.9–10.3)
Chloride: 103 mmol/L (ref 98–111)
Creatinine, Ser: 3.09 mg/dL — ABNORMAL HIGH (ref 0.61–1.24)
GFR, Estimated: 22 mL/min — ABNORMAL LOW (ref >60)
Glucose, Bld: 165 mg/dL — ABNORMAL HIGH (ref 70–99)
Potassium: 4.3 mmol/L (ref 3.5–5.1)
Sodium: 132 mmol/L — ABNORMAL LOW (ref 135–145)
Total Bilirubin: 0.8 mg/dL (ref 0.3–1.2)
Total Protein: 5.7 g/dL — ABNORMAL LOW (ref 6.5–8.1)

## 2021-11-10 LAB — MAGNESIUM: Magnesium: 1.9 mg/dL (ref 1.7–2.4)

## 2021-11-10 LAB — PROTIME-INR
INR: 1.7 — ABNORMAL HIGH (ref 0.8–1.2)
Prothrombin Time: 19.8 seconds — ABNORMAL HIGH (ref 11.4–15.2)

## 2021-11-10 LAB — CBC WITH DIFFERENTIAL/PLATELET
Abs Immature Granulocytes: 0.28 10*3/uL — ABNORMAL HIGH (ref 0.00–0.07)
Basophils Absolute: 0.1 10*3/uL (ref 0.0–0.1)
Basophils Relative: 1 %
Eosinophils Absolute: 0.2 10*3/uL (ref 0.0–0.5)
Eosinophils Relative: 2 %
HCT: 34.4 % — ABNORMAL LOW (ref 39.0–52.0)
Hemoglobin: 11.4 g/dL — ABNORMAL LOW (ref 13.0–17.0)
Immature Granulocytes: 3 %
Lymphocytes Relative: 6 %
Lymphs Abs: 0.6 10*3/uL — ABNORMAL LOW (ref 0.7–4.0)
MCH: 28.1 pg (ref 26.0–34.0)
MCHC: 33.1 g/dL (ref 30.0–36.0)
MCV: 84.7 fL (ref 80.0–100.0)
Monocytes Absolute: 1 10*3/uL (ref 0.1–1.0)
Monocytes Relative: 10 %
Neutro Abs: 8 10*3/uL — ABNORMAL HIGH (ref 1.7–7.7)
Neutrophils Relative %: 78 %
Platelets: 210 10*3/uL (ref 150–400)
RBC: 4.06 MIL/uL — ABNORMAL LOW (ref 4.22–5.81)
RDW: 14.6 % (ref 11.5–15.5)
WBC: 10 10*3/uL (ref 4.0–10.5)
nRBC: 0 % (ref 0.0–0.2)

## 2021-11-10 LAB — PROCALCITONIN: Procalcitonin: 0.95 ng/mL

## 2021-11-10 LAB — GLUCOSE, CAPILLARY
Glucose-Capillary: 182 mg/dL — ABNORMAL HIGH (ref 70–99)
Glucose-Capillary: 234 mg/dL — ABNORMAL HIGH (ref 70–99)
Glucose-Capillary: 229 mg/dL — ABNORMAL HIGH (ref 70–99)
Glucose-Capillary: 196 mg/dL — ABNORMAL HIGH (ref 70–99)

## 2021-11-10 LAB — C-REACTIVE PROTEIN: CRP: 15.4 mg/dL — ABNORMAL HIGH (ref <1.0)

## 2021-11-10 LAB — BRAIN NATRIURETIC PEPTIDE: B Natriuretic Peptide: 625.6 pg/mL — ABNORMAL HIGH (ref 0.0–100.0)

## 2021-11-10 LAB — UREA NITROGEN, URINE: Urea Nitrogen, Ur: 532 mg/dL

## 2021-11-10 MED ORDER — SODIUM CHLORIDE 0.9 % IV SOLN
620.0000 mg | INTRAVENOUS | Status: DC
  Administered 2021-11-10: 620 mg via INTRAVENOUS
  Filled 2021-11-10: qty 620

## 2021-11-10 MED ORDER — SODIUM CHLORIDE 0.9 % IV SOLN: INTRAVENOUS | Status: AC

## 2021-11-10 MED ORDER — WARFARIN SODIUM 7.5 MG PO TABS
7.5000 mg | ORAL_TABLET | Freq: Once | ORAL | Status: AC
  Administered 2021-11-10: 7.5 mg via ORAL
  Filled 2021-11-10: qty 1

## 2021-11-10 NOTE — Evaluation (Signed)
Physical Therapy Evaluation Patient Details Name: Robert [name] MRN: [id number] DOB: December 16, 1960 Today's Date: 11/10/2021  History of Present Illness  61 y/o male presented to ED on 11/08/21 for R lower quadrant pain. CT abdomen negative. CXR negative. Found to have UTI. Tested positive for COVID-19. Recently admitted 1/14-1/19 for HTN urgency and d/c'ed to SNF. PMH includes IDDM, CKD s/p renal transplant, HTN, CAD s/p CABG 25/0539, embolic CVA  Clinical Impression  Patient admitted with above diagnosis. Patient presents with generalized weakness (R>L), impaired balance, decreased activity tolerance, impaired sensation, and impaired coordination. Patient requires modA for bed mobility with HOB elevated, modA+2 to stand from EOB, and minA+2 to take pivotal steps towards recliner this date. Educated patient on seated exercises to improve LE and core strengthening while maintaining sternal precautions, patient verbalized understanding. Encouraged patient to get OOB for meals and to participate with care with nursing staff. Educated patient PT frequency while admitted into hospital, patient verbalized understanding. Patient will benefit from skilled PT services during acute stay to address listed deficits. Recommend patient to return to SNF at discharge to maximize functional independence and safety prior to returning home.      Recommendations for follow up therapy are one component of a multi-disciplinary discharge planning process, led by the attending physician.  Recommendations may be updated based on patient status, additional functional criteria and insurance authorization.  Follow Up Recommendations Skilled nursing-short term rehab (<3 hours/day)    Assistance Recommended at Discharge Frequent or constant Supervision/Assistance  Patient can return home with the following  A lot of help with walking and/or transfers;A lot of help with bathing/dressing/bathroom    Equipment Recommendations  Rolling Caroline Longie (2 wheels);Wheelchair (measurements PT);Wheelchair cushion (measurements PT);BSC/3in1  Recommendations for Other Services       Functional Status Assessment Patient has had a recent decline in their functional status and/or demonstrates limited ability to make significant improvements in function in a reasonable and predictable amount of time     Precautions / Restrictions Precautions Precautions: Fall;Sternal Precaution Booklet Issued: No Restrictions Weight Bearing Restrictions: No      Mobility  Bed Mobility Overal bed mobility: Needs Assistance Bed Mobility: Rolling, Sidelying to Sit Rolling: Min guard Sidelying to sit: Mod assist, HOB elevated       General bed mobility comments: modA for trunk elevation with HOB max elevated    Transfers Overall transfer level: Needs assistance Equipment used: Rolling Tionne Dayhoff (2 wheels) Transfers: Sit to/from Stand, Bed to chair/wheelchair/BSC Sit to Stand: Mod assist, +2 physical assistance, +2 safety/equipment, From elevated surface Stand pivot transfers: Min assist, +2 safety/equipment, +2 physical assistance         General transfer comment: modA+2 to stand from EOB with good recall of sternal precautions to stand. MinA+2 for step pivot transfer to recliner, cues for R knee extension as patient keeps it in flexed position with transfer    Ambulation/Gait                  Stairs            Wheelchair Mobility    Modified Rankin (Stroke Patients Only)       Balance Overall balance assessment: Needs assistance Sitting-balance support: No upper extremity supported, Feet supported Sitting balance-Leahy Scale: Fair     Standing balance support: Bilateral upper extremity supported, Reliant on assistive device for balance, During functional activity Standing balance-Leahy Scale: Poor Standing balance comment: reliant on RW  Pertinent Vitals/Pain Pain  Assessment Pain Assessment: No/denies pain    Home Living Family/patient expects to be discharged to:: Skilled nursing facility Living Arrangements: Spouse/significant other Available Help at Discharge: Family Type of Home: House Home Access: Stairs to enter Entrance Stairs-Rails: Right;Left;Can reach both Entrance Stairs-Number of Steps: 3 Alternate Level Stairs-Number of Steps: 2 flights of steps Home Layout: Two level;Able to live on main level with bedroom/bathroom Home Equipment: Shower seat;Grab bars - tub/shower;Hand held shower head      Prior Function Prior Level of Function : Independent/Modified Independent;Working/employed;Driving             Mobility Comments: recently at SNF, patient limited to chair transfers and max 20' with RW one time. Requires assistance from staff to mobilize ADLs Comments: requires assistance from staff at SNF to perform ADLs     Hand Dominance   Dominant Hand: Left    Extremity/Trunk Assessment   Upper Extremity Assessment Upper Extremity Assessment: Defer to OT evaluation    Lower Extremity Assessment Lower Extremity Assessment: [name] deficits/detail [name] Deficits / Details: functionally 3+/5 [name] Sensation: history of peripheral neuropathy    Cervical / Trunk Assessment Cervical / Trunk Assessment: Other exceptions Cervical / Trunk Exceptions: sternal incision/precautions  Communication   Communication: Expressive difficulties  Cognition Arousal/Alertness: Awake/alert Behavior During Therapy: WFL for tasks assessed/performed Overall Cognitive Status: Within Functional Limits for tasks assessed                                 General Comments: appears WFL. Very particular with care        General Comments      Exercises Other Exercises Other Exercises: instructed patient on LAQs, seated marching, and heel/toe raises   Assessment/Plan    PT Assessment Patient needs continued PT services  PT Problem  List Decreased strength;Decreased activity tolerance;Decreased mobility;Decreased balance;Cardiopulmonary status limiting activity;Impaired sensation       PT Treatment Interventions DME instruction;Gait training;Stair training;Functional mobility training;Therapeutic activities;Therapeutic exercise;Balance training;Neuromuscular re-education;Patient/family education    PT Goals (Current goals can be found in the Care Plan section)  Acute Rehab PT Goals Patient Stated Goal: to return home once independent PT Goal Formulation: With patient Time For Goal Achievement: 11/24/21 Potential to Achieve Goals: Good    Frequency Min 2X/week     Co-evaluation PT/OT/SLP Co-Evaluation/Treatment: Yes Reason for Co-Treatment: Necessary to address cognition/behavior during functional activity;For patient/therapist safety;To address functional/ADL transfers PT goals addressed during session: Mobility/safety with mobility;Balance;Strengthening/ROM         AM-PAC PT "6 Clicks" Mobility  Outcome Measure Help needed turning from your back to your side while in a flat bed without using bedrails?: A Lot Help needed moving from lying on your back to sitting on the side of a flat bed without using bedrails?: A Lot Help needed moving to and from a bed to a chair (including a wheelchair)?: Total Help needed standing up from a chair using your arms (e.g., wheelchair or bedside chair)?: Total Help needed to walk in hospital room?: Total Help needed climbing 3-5 steps with a railing? : Total 6 Click Score: 8    End of Session Equipment Utilized During Treatment: Gait belt Activity Tolerance: Patient tolerated treatment well Patient left: in chair;with call bell/phone within reach;with chair alarm set Nurse Communication: Mobility status PT Visit Diagnosis: Other abnormalities of gait and mobility (R26.89);Dizziness and giddiness (R42)    Time: 7322-0254 PT Time Calculation (min) (ACUTE  ONLY): 40  min   Charges:   PT Evaluation $PT Eval Moderate Complexity: 1 Mod PT Treatments $Therapeutic Activity: 8-22 mins        Jaydalynn Olivero A. Gilford Rile PT, DPT Acute Rehabilitation Services Pager [phone] Office [phone]   Linna Hoff 11/10/2021, 2:40 PM

## 2021-11-10 NOTE — Evaluation (Signed)
Occupational Therapy Evaluation Patient Details Name: Robert [name] MRN: [id number] DOB: 01-15-1961 Today's Date: 11/10/2021   History of Present Illness 61 y/o male presented to ED on 11/08/21 for R lower quadrant pain. CT abdomen negative. CXR negative. Found to have UTI. Tested positive for COVID-19. Recently admitted 1/14-1/19 for HTN urgency and d/c'ed to SNF. PMH includes IDDM, CKD s/p renal transplant, HTN, CAD s/p CABG 15/4008, embolic CVA   Clinical Impression   Pt presents with decline in function and safety with ADLs and ADL mobility with impaired strength, balance and endurance. Pt was at [facility] for [place] rehab; prior to [facility] pt lived at home with his wife and was Ind with ADLs/selfcare, mobility, was driving. Pt currently requires assist with LB ADLs, toileting and all mobility.  Educated patient on seated exercises tto improve endurance and core strengthening while maintaining sternal precautions, patient verbalized understanding. Encouraged patient to get OOB for meals and to participate with his own selfcare with nursing staff. Educated patient acute stay therapy frequency while admitted into hospital, patient verbalized understanding. Pt would benefit from acute OT services to maximize level of function and safety     Recommendations for follow up therapy are one component of a multi-disciplinary discharge planning process, led by the attending physician.  Recommendations may be updated based on patient status, additional functional criteria and insurance authorization.   Follow Up Recommendations  Skilled nursing-short term rehab (<3 hours/day)    Assistance Recommended at Discharge    Patient can return home with the following A lot of help with walking and/or transfers;A lot of help with bathing/dressing/bathroom;Assist for transportation;Help with stairs or ramp for entrance    Functional Status Assessment  Patient has had a recent decline in their functional status and  demonstrates the ability to make significant improvements in function in a reasonable and predictable amount of time.  Equipment Recommendations  BSC/3in1;Wheelchair (measurements OT);Wheelchair cushion (measurements OT) (RW)    Recommendations for Other Services       Precautions / Restrictions Precautions Precautions: Fall;Sternal Precaution Booklet Issued: No Restrictions Weight Bearing Restrictions: No Other Position/Activity Restrictions: sternal precautions      Mobility Bed Mobility Overal bed mobility: Needs Assistance Bed Mobility: Rolling, Sidelying to Sit Rolling: Min guard Sidelying to sit: Mod assist, HOB elevated       General bed mobility comments: modA for trunk elevation with HOB max elevated    Transfers Overall transfer level: Needs assistance Equipment used: Rolling walker (2 wheels) Transfers: Sit to/from Stand, Bed to chair/wheelchair/BSC Sit to Stand: Mod assist, +2 physical assistance, +2 safety/equipment, From elevated surface Stand pivot transfers: Min assist, +2 safety/equipment, +2 physical assistance         General transfer comment: mod A+2 to stand from EOB with good recall of sternal precautions to stand. Min A+2 for step pivot transfer to recliner, cues for R knee extension as patient keeps it in flexed position with transfer      Balance Overall balance assessment: Needs assistance Sitting-balance support: No upper extremity supported, Feet supported Sitting balance-Leahy Scale: Fair     Standing balance support: Bilateral upper extremity supported, Reliant on assistive device for balance, During functional activity Standing balance-Leahy Scale: Poor                             ADL either performed or assessed with clinical judgement   ADL Overall ADL's : Needs assistance/impaired Eating/Feeding: Set up;Independent;Sitting  Grooming: Wash/dry hands;Wash/dry face;Set up;Sitting       Lower Body Bathing:  Sitting/lateral leans;Maximal assistance;Moderate assistance Lower Body Bathing Details (indicate cue type and reason): simulated seated in recliner     Lower Body Dressing: Total assistance   Toilet Transfer: Minimal assistance;+2 for safety/equipment;Stand-pivot;Ambulation;Rolling walker (2 wheels) Toilet Transfer Details (indicate cue type and reason): simulated to recliner Toileting- Clothing Manipulation and Hygiene: Maximal assistance;Sitting/lateral lean Toileting - Clothing Manipulation Details (indicate cue type and reason): simulated seated in recliner     Functional mobility during ADLs: Minimal assistance;+2 for physical assistance;Cueing for safety;Rolling walker (2 wheels)       Vision Baseline Vision/History: 1 Wears glasses Ability to See in Adequate Light: 0 Adequate Patient Visual Report: No change from baseline       Perception     Praxis      Pertinent Vitals/Pain Pain Assessment Pain Assessment: No/denies pain Pain Score: 0-No pain Pain Intervention(s): Monitored during session, Repositioned     Hand Dominance Left   Extremity/Trunk Assessment Upper Extremity Assessment Upper Extremity Assessment: Generalized weakness RUE Deficits / Details: unable to fully assess due to sternal precautions   Lower Extremity Assessment Lower Extremity Assessment: Defer to PT evaluation [name] Deficits / Details: functionally 3+/5 [name] Sensation: history of peripheral neuropathy   Cervical / Trunk Assessment Cervical / Trunk Assessment: Other exceptions Cervical / Trunk Exceptions: sternal incision/precautions   Communication Communication Communication: Expressive difficulties (mild)   Cognition Arousal/Alertness: Awake/alert Behavior During Therapy: WFL for tasks assessed/performed Overall Cognitive Status: Within Functional Limits for tasks assessed                                 General Comments: appears WFL. Very particular with care      General Comments       Exercises     Shoulder Instructions      Home Living Family/patient expects to be discharged to:: Skilled nursing facility Living Arrangements: Spouse/significant other Available Help at Discharge: Family Type of Home: House Home Access: Stairs to enter [employer] of Steps: 3 Entrance Stairs-Rails: Right;Left;Can reach both Home Layout: Two level;Able to live on main level with bedroom/bathroom Alternate Level Stairs-Number of Steps: 2 flights of steps Alternate Level Stairs-Rails: Right Bathroom Shower/Tub: Occupational psychologist: Standard     Home Equipment: Shower seat;Grab bars - tub/shower;Hand held shower head;Adaptive equipment Adaptive Equipment: Reacher;Sock aid        Prior Functioning/Environment Prior Level of Function : Independent/Modified Independent;Working/employed;Driving             Mobility Comments: recently at SNF, patient limited to chair transfers and max 20' with RW one time. Requires assistance from staff to mobilize ADLs Comments: requires assistance from staff at SNF to perform LB ADLs        OT Problem List: Decreased strength;Decreased activity tolerance;Impaired balance (sitting and/or standing);Decreased knowledge of use of DME or AE;Cardiopulmonary status limiting activity;Impaired UE functional use;Increased edema      OT Treatment/Interventions: Self-care/ADL training;Energy conservation;DME and/or AE instruction;Manual therapy;Therapeutic activities;Patient/family education;Balance training    OT Goals(Current goals can be found in the care plan section) Acute Rehab OT Goals Patient Stated Goal: go back to rehab, then home OT Goal Formulation: With patient Time For Goal Achievement: 11/24/21 Potential to Achieve Goals: Good ADL Goals Pt Will Perform Grooming: with min assist;with min guard assist;standing Pt Will Perform Upper Body Bathing: with set-up;with supervision;sitting Pt  Will Perform Lower Body Bathing: with min assist;sitting/lateral leans;with adaptive equipment Pt Will Transfer to Toilet: with min assist;with min guard assist;ambulating Pt Will Perform Toileting - Clothing Manipulation and hygiene: with min assist;sit to/from stand  OT Frequency: Min 2X/week    Co-evaluation PT/OT/SLP Co-Evaluation/Treatment: Yes Reason for Co-Treatment: Complexity of the patient's impairments (multi-system involvement);For patient/therapist safety;To address functional/ADL transfers PT goals addressed during session: Mobility/safety with mobility;Balance;Strengthening/ROM OT goals addressed during session: ADL's and self-care;Proper use of Adaptive equipment and DME      AM-PAC OT "6 Clicks" Daily Activity     Outcome Measure Help from another person eating meals?: None Help from another person taking care of personal grooming?: A Little Help from another person toileting, which includes using toliet, bedpan, or urinal?: A Lot Help from another person bathing (including washing, rinsing, drying)?: A Lot Help from another person to put on and taking off regular upper body clothing?: A Little Help from another person to put on and taking off regular lower body clothing?: A Lot 6 Click Score: 16   End of Session Equipment Utilized During Treatment: Gait belt;Rolling walker (2 wheels) Nurse Communication: Mobility status  Activity Tolerance: Patient tolerated treatment well Patient left: in chair;with call bell/phone within reach  OT Visit Diagnosis: Unsteadiness on feet (R26.81);Muscle weakness (generalized) (M62.81)                Time: 6195-0932 OT Time Calculation (min): 39 min Charges:  OT General Charges $OT Visit: 1 Visit OT Evaluation $OT Eval Moderate Complexity: 1 Mod    [name] 11/10/2021, 3:26 PM

## 2021-11-10 NOTE — NC FL2 (Signed)
[place] LEVEL OF CARE SCREENING TOOL     IDENTIFICATION  Patient Name: Robert [name] Birthdate: 1961-04-13 Sex: male Admission Date (Current Location): 11/08/2021  [facility] and [place] Number:  Herbalist and Address:  [facility], [place] [address], [place], [place] [address]      Provider Number: [id number]  Attending Physician Name and Address:  Thurnell Lose, MD  Relative Name and Phone Number:  Atanacio, Melnyk (Spouse)   [phone]    Current Level of Care: Hospital Recommended Level of Care: [place] Prior Approval Number:    Date Approved/Denied:   PASRR Number: [id number]  Discharge Plan: SNF    Current Diagnoses: Patient Active Problem List   Diagnosis Date Noted   Acute cystitis without hematuria 11/09/2021   Anticoagulated on Coumadin 11/09/2021   Hyponatremia 11/09/2021   Cerebrovascular accident (CVA) due to embolism of precerebral artery (HCC)    S/P AVR (aortic valve replacement) 10/05/2021   Exertional dyspnea    Fissure of skin 03/08/2020   Kidney transplant status 07/26/2017   Inguinal hernia without obstruction or gangrene 06/30/2017   Tremor of both hands 06/29/2017   Need for prophylactic immunotherapy 05/20/2017   Hydrocele, acquired 04/07/2017   Scrotal edema 03/14/2017   H/O diabetic neuropathy 03/01/2017   Immunosuppressed status ([place]) 02/26/2017   Prophylactic antibiotic 02/26/2017   Benign neoplasm of transverse colon    Diverticulosis of colon without hemorrhage    Morbid obesity ([place]) 02/03/2016   Type 2 diabetes mellitus with hyperglycemia, with long-term current use of insulin ([place]) 07/14/2015   HTN (hypertension) 05/11/2015   Chronic renal failure    Anemia in chronic kidney disease 03/30/2015   Long term (current) use of insulin ([place]) 03/30/2015   Benign essential HTN 07/30/2014   Protein-calorie malnutrition, severe ([place]) 06/15/2014   Stage 3b chronic kidney  disease (CKD) ([place]) 03/22/2014   Fatigue 01/08/2014   H/O unilateral nephrectomy 09/06/2013   Diabetes mellitus type 2 in obese ([place]) 09/06/2013   SVT (supraventricular tachycardia) ([place]) 09/06/2013   Lower extremity edema 09/06/2013   Severe obstructive sleep apnea 09/06/2013   Hypothyroidism 09/06/2013   Hyperlipidemia with target LDL less than 70 09/06/2013    Orientation RESPIRATION BLADDER Height & Weight     Self, Time, Situation, Place  Normal Continent Weight: 274 lb 11.1 oz (124.6 kg) Height:  5\' 11"  (180.3 cm)  BEHAVIORAL SYMPTOMS/MOOD NEUROLOGICAL BOWEL NUTRITION STATUS      Continent Diet (see d/c summary)  AMBULATORY STATUS COMMUNICATION OF NEEDS Skin   Extensive Assist Verbally Normal                       Personal Care Assistance Level of Assistance  Bathing, Feeding, Dressing Bathing Assistance: Maximum assistance Feeding assistance: Limited assistance Dressing Assistance: Maximum assistance     Functional Limitations Info  Sight, Hearing, Speech Sight Info: Adequate Hearing Info: Adequate Speech Info: Adequate    SPECIAL CARE FACTORS FREQUENCY  PT (By licensed PT), OT (By licensed OT)     PT Frequency: 5x/ week OT Frequency: 5x/ week            Contractures Contractures Info: Not present    Additional Factors Info  Code Status, Psychotropic, Insulin Sliding Scale, Allergies Code Status Info: Full Allergies Info: NKA Psychotropic Info: Alprazolam Insulin Sliding Scale Info: See d/c med list       Current Medications (11/10/2021):  This is the current hospital  active medication list Current Facility-Administered Medications  Medication Dose Route Frequency Provider Last Rate Last Admin   0.9 %  sodium chloride infusion   Intravenous Continuous Maudie Mercury, MD 125 mL/hr at 11/10/21 1306 Rate Change at 11/10/21 1306   acetaminophen (TYLENOL) tablet 650 mg  650 mg Oral Q6H PRN Kristopher Oppenheim, DO       Or   acetaminophen (TYLENOL)  suppository 650 mg  650 mg Rectal Q6H PRN Kristopher Oppenheim, DO       albuterol (PROVENTIL) (2.5 MG/3ML) 0.083% nebulizer solution 2.5 mg  2.5 mg Nebulization Q2H PRN Kristopher Oppenheim, DO       ALPRAZolam Duanne Moron) tablet 0.25 mg  0.25 mg Oral BID PRN Kristopher Oppenheim, DO       amiodarone (PACERONE) tablet 200 mg  200 mg Oral BID Kristopher Oppenheim, DO   200 mg at 11/10/21 0844   amLODipine (NORVASC) tablet 10 mg  10 mg Oral QHS Kristopher Oppenheim, DO   10 mg at 11/09/21 2149   belatacept (NULOJIX) 620 mg in sodium chloride 0.9 % 75.2 mL IVPB  620 mg Intravenous Q28 days Pham, Minh Q, RPH-CPP 200 mL/hr at 11/10/21 1051 620 mg at 11/10/21 1051   carvedilol (COREG) tablet 25 mg  25 mg Oral BID WC Kristopher Oppenheim, DO   25 mg at 11/10/21 1605   cholecalciferol (VITAMIN D3) tablet 1,000 Units  1,000 Units Oral Daily Thurnell Lose, MD   1,000 Units at 11/10/21 0844   doxazosin (CARDURA) tablet 2 mg  2 mg Oral BID Kristopher Oppenheim, DO   2 mg at 11/10/21 0844   DULoxetine (CYMBALTA) DR capsule 60 mg  60 mg Oral Daily Kristopher Oppenheim, DO   60 mg at 11/10/21 0844   enoxaparin (LOVENOX) injection 130 mg  130 mg Subcutaneous Q12H Laren Everts, RPH   130 mg at 11/10/21 0526   famotidine (PEPCID) tablet 20 mg  20 mg Oral Daily Thurnell Lose, MD   20 mg at 11/10/21 0844   gabapentin (NEURONTIN) capsule 100 mg  100 mg Oral BID Thurnell Lose, MD   100 mg at 11/10/21 0844   hydrALAZINE (APRESOLINE) injection 10 mg  10 mg Intravenous Q6H PRN Thurnell Lose, MD       insulin aspart (novoLOG) injection 0-15 Units  0-15 Units Subcutaneous TID WC Kristopher Oppenheim, DO   5 Units at 11/10/21 1206   insulin aspart (novoLOG) injection 0-5 Units  0-5 Units Subcutaneous QHS Kristopher Oppenheim, DO   2 Units at 11/09/21 2149   insulin aspart (novoLOG) injection 4 Units  4 Units Subcutaneous TID WC Kristopher Oppenheim, DO   4 Units at 11/10/21 0851   insulin glargine-yfgn [facility]) injection 15 Units  15 Units Subcutaneous Daily Kristopher Oppenheim, DO   15 Units at 11/10/21 0845   levothyroxine  (SYNTHROID) tablet 100 mcg  100 mcg Oral QAC breakfast Kristopher Oppenheim, DO   100 mcg at 11/10/21 9509   mycophenolate (CELLCEPT) capsule 1,000 mg  1,000 mg Oral Q12H Kristopher Oppenheim, DO   1,000 mg at 11/10/21 0845   ondansetron (ZOFRAN) tablet 4 mg  4 mg Oral Q6H PRN Kristopher Oppenheim, DO       Or   ondansetron [facility]) injection 4 mg  4 mg Intravenous Q6H PRN Kristopher Oppenheim, DO       piperacillin-tazobactam (ZOSYN) IVPB 3.375 g  3.375 g Intravenous Q8H Wynona Neat P, RPH 12.5 mL/hr at 11/10/21 1608 3.375 g at 11/10/21 1608  predniSONE (DELTASONE) tablet 5 mg  5 mg Oral Q breakfast Kristopher Oppenheim, DO   5 mg at 11/10/21 0844   rosuvastatin (CRESTOR) tablet 40 mg  40 mg Oral Daily Thurnell Lose, MD   40 mg at 11/10/21 0843   tamsulosin (FLOMAX) capsule 0.8 mg  0.8 mg Oral QHS Thurnell Lose, MD   0.8 mg at 11/09/21 2148   traZODone (DESYREL) tablet 50 mg  50 mg Oral QHS PRN Kristopher Oppenheim, DO       Warfarin - Pharmacist Dosing Inpatient   Does not apply q1600 Laren Everts, [facility]   Given at 11/09/21 1708     Discharge Medications: Please see discharge summary for a list of discharge medications.  Relevant Imaging Results:  Relevant Lab Results:   Additional Information ([phone], Moderna COVID-19 Vaccine 01/24/2020 , 12/18/2019, 5'11" 274lbs  Paulene Floor Krystalle Pilkington, LCSWA

## 2021-11-10 NOTE — Progress Notes (Signed)
Spoke with [facility] pharmacy to confirm wt and dose of belatacept.   Currrent Weight (Kg) 124.6 kg (274 lb 11.1 oz)   Transplant Weight (Kg) 123.7    Dose = 620mg   We will give dose today  Onnie Boer, PharmD, BCIDP, AAHIVP, CPP Infectious Disease Pharmacist 11/10/2021 8:52 AM

## 2021-11-10 NOTE — Progress Notes (Signed)
RT Note:  Patient has home BIPAP unit at bedside. No assistance needed from RT at this time. Patient aware to call for assistance if needed.

## 2021-11-10 NOTE — TOC Progression Note (Addendum)
Transition of Care [facility]) - Initial/Assessment Note    Patient Details  Name: Robert [name] MRN: [id number] Date of Birth: 09/16/1961  Transition of Care [facility]) CM/SW Contact:    Milinda Antis, [place] Phone Number: 11/10/2021, 11:17 AM  Clinical Narrative:                 CSW received and acknowledged another consult for SNF placement.  PT and OT assessments and recommendations need to be completed in order for the SNF process to begin.    CSW has attempted to contact the patient via room phone and cell phone and there was no answer (busy signal).  CSW notified the charge RN, assigned RN, and assigned Nurse tech of attempts to speak with the patient.    11:29-  CSW received a returned call from the patient.  The patient expressed concerns with not speaking to a Education officer, museum or being seen by PT or OT while in the ED.  The patient requested the CSW send FL2 to "[facility]" SNF in [place].  CSW explained the SNF process and that PT and OT recommendations are needed prior to completing an FL2.  Pt then stated that he did not know if PT/Ot were really coming.  CSW explained that PT/OT has been ordered.  CSW offered to give the patient the number to patient experiences because of the patient's list of concerns, but the patient declined stating that he has been directed to call to many people.  CSW informed the patient that should PT/OT recommend SNF, CSW would make attempts to send the [facility] to his facility of choice.   14:28-  CSW was contacted by the patient and informed that PT and OT have worked with the patient and the patient was requesting that his FL2 be sent to the facility.  CSW explained that the disciplines had to enter their assessments into the patient's chart for CSW to review and that CSW would complete referral after this was completed.    15:50-  CSW reviewed chart and observed that PT and OT are recommending SNF.  CSW contacted the patient facility of choice [facility] [phone]) and spoke with [facility] in admissions.  CSW was informed that the facility has spoken with the patient's family and informed them that the facility does not have a bed available now, but anything can change.  The facility is not accepting patients over the weekend.  CSW was asked to send a referral for the patient for the facility to review.    16:35-  CSW received a call from the patient inquiring about PT/OT recommendations and the status of the FL2 being sent to [facility].  CSW informed the patient of the information above.    Patient Goals and CMS Choice        Expected Discharge Plan and Services                                                Prior Living Arrangements/Services                       Activities of Daily Living Home Assistive Devices/Equipment: BIPAP, Eyeglasses ADL Screening (condition at time of admission) Patient's cognitive ability adequate to safely complete daily activities?: Yes Is the patient deaf or have difficulty hearing?: No Does  the patient have difficulty seeing, even when wearing glasses/contacts?: No Does the patient have difficulty concentrating, remembering, or making decisions?: Yes Patient able to express need for assistance with ADLs?: Yes Does the patient have difficulty dressing or bathing?: Yes Independently performs ADLs?: No Communication: Independent Dressing (OT): Needs assistance Is this a change from baseline?: Pre-admission baseline Grooming: Independent Is this a change from baseline?: Pre-admission baseline Feeding: Independent Bathing: Needs assistance Is this a change from baseline?: Pre-admission baseline Toileting: Needs assistance Is this a change from baseline?: Pre-admission baseline In/Out Bed: Needs assistance Is this a change from baseline?: Pre-admission baseline Walks in Home: Needs assistance Is this a change from baseline?: Pre-admission  baseline Does the patient have difficulty walking or climbing stairs?: Yes Weakness of Legs: Both Weakness of Arms/Hands: None  Permission Sought/Granted                  Emotional Assessment              Admission diagnosis:  Acute cystitis without hematuria [N30.00] Urinary tract infection with hematuria, site unspecified [N39.0, R31.9] Patient Active Problem List   Diagnosis Date Noted   Acute cystitis without hematuria 11/09/2021   Anticoagulated on Coumadin 11/09/2021   Hyponatremia 11/09/2021   Cerebrovascular accident (CVA) due to embolism of precerebral artery ([place])    S/P AVR (aortic valve replacement) 10/05/2021   Exertional dyspnea    Fissure of skin 03/08/2020   Kidney transplant status 07/26/2017   Inguinal hernia without obstruction or gangrene 06/30/2017   Tremor of both hands 06/29/2017   Need for prophylactic immunotherapy 05/20/2017   Hydrocele, acquired 04/07/2017   Scrotal edema 03/14/2017   H/O diabetic neuropathy 03/01/2017   Immunosuppressed status ([place]) 02/26/2017   Prophylactic antibiotic 02/26/2017   Benign neoplasm of transverse colon    Diverticulosis of colon without hemorrhage    Morbid obesity ([place]) 02/03/2016   Type 2 diabetes mellitus with hyperglycemia, with long-term current use of insulin ([place]) 07/14/2015   HTN (hypertension) 05/11/2015   Chronic renal failure    Anemia in chronic kidney disease 03/30/2015   Long term (current) use of insulin ([place]) 03/30/2015   Benign essential HTN 07/30/2014   Protein-calorie malnutrition, severe ([place]) 06/15/2014   Stage 3b chronic kidney disease (CKD) ([place]) 03/22/2014   Fatigue 01/08/2014   H/O unilateral nephrectomy 09/06/2013   Diabetes mellitus type 2 in obese ([place]) 09/06/2013   SVT (supraventricular tachycardia) ([place]) 09/06/2013   Lower extremity edema 09/06/2013   Severe obstructive sleep apnea 09/06/2013   Hypothyroidism 09/06/2013   Hyperlipidemia with target LDL less than 70  09/06/2013   PCP:  [name], [name], MD Pharmacy:   [id number] - [place], [place] - [address] AT ACROSS FROM [address] [place] [address] Phone: [phone] Fax: [phone]     Social Determinants of Health (SDOH) Interventions    Readmission Risk Interventions No flowsheet data found.

## 2021-11-10 NOTE — Progress Notes (Signed)
Admit: 11/08/2021 LOS: 1  Brief Hx:  61 y.o. male with a PMH of renal transplant in 2018 at [facility], OSA, s/p AVR and CABG on 09/2021, HTN, HLD, TIIDM with insulin, hypothyroidism, and recent stroke on coumadin, presented to the ED from his SNF after experiencing 8-9/10 right lower abdominal pain the evening prior to presentation. It was additionally noted that the patient experienced a fever of 102 F. Is being treated for acute cystitis in a transplanted kidney. Nephrology was consulted for assistance with his medications and AKI.   Subjective:  Robert [name] was seen at bedside this morning. He reiterates this morning that he would like to be placed at a different home when he is ready for discharge and would like to speak with SW in aiding him with his request. He is otherwise well, and has no complaints of fevers, nausea, vomiting, chest pain, SHOB, or abdominal pain.   02/02 0701 - 02/03 0700 In: 1532.3 [P.O.:420; I.V.:1048.8; IV Piggyback:63.5] Out: 2000 [Urine:2000]  Filed Weights   11/09/21 0807 11/09/21 1558 11/10/21 0520  Weight: 121.8 kg 124.7 kg 124.6 kg    Scheduled Meds:  amiodarone  200 mg Oral BID   amLODipine  10 mg Oral QHS   carvedilol  25 mg Oral BID WC   cholecalciferol  1,000 Units Oral Daily   doxazosin  2 mg Oral BID   DULoxetine  60 mg Oral Daily   enoxaparin (LOVENOX) injection  130 mg Subcutaneous Q12H   famotidine  20 mg Oral Daily   gabapentin  100 mg Oral BID   insulin aspart  0-15 Units Subcutaneous TID WC   insulin aspart  0-5 Units Subcutaneous QHS   insulin aspart  4 Units Subcutaneous TID WC   insulin glargine-yfgn  15 Units Subcutaneous Daily   levothyroxine  100 mcg Oral QAC breakfast   mycophenolate  1,000 mg Oral Q12H   predniSONE  5 mg Oral Q breakfast   rosuvastatin  40 mg Oral Daily   tamsulosin  0.8 mg Oral QHS   Warfarin - Pharmacist Dosing Inpatient   Does not apply q1600   Continuous Infusions:  sodium chloride 75 mL/hr at 11/10/21 0611    piperacillin-tazobactam (ZOSYN)  IV 3.375 g (11/09/21 2335)   PRN Meds:.acetaminophen **OR** acetaminophen, albuterol, ALPRAZolam, hydrALAZINE, ondansetron **OR** ondansetron (ZOFRAN) IV, traZODone  Current Labs: reviewed    Physical Exam:  Blood pressure (!) 148/69, pulse 61, temperature 98.4 F (36.9 C), resp. rate 18, height 5\' 11"  (1.803 m), weight 124.6 kg, SpO2 94 %. Physical Exam Constitutional:      General: He is not in acute distress.    Appearance: Normal appearance.  Cardiovascular:     Rate and Rhythm: Normal rate and regular rhythm.     Pulses: Normal pulses.     Heart sounds: Murmur heard.    No friction rub. No gallop.     Comments: + systolic murmur  Pulmonary:     Effort: Pulmonary effort is normal.     Breath sounds: Normal breath sounds. No wheezing, rhonchi or rales.  Abdominal:     General: Abdomen is flat. Bowel sounds are normal.     Palpations: Abdomen is soft.     Tenderness: There is no abdominal tenderness.  Neurological:     Mental Status: He is alert.     # Acute Cystitis in a Transplanted Kidney:  Patient presenting with acute RLQ pain and fever of 102 F at facility with a Tmax of 101.67F in  the ED. No febrile events since admission, Urine culture showing 100,000 gram negative rods, awaiting sensitivities. Nulojox approved for administration today.  - Continue Zosyn until sensitives come back, complete antibiotic course for 14 days.  - Nulojix today - Continue Prednisone 5 mg daily  - Continue Cellcept 1,000 mg twice daily - Continue to monitor renal function   # AKI on CKD Stage 3b:  Patient with baseline of 2.39 with presenting with an AKI of 2.89, likely in the setting of dehydration and infection. BUN/Cr ration of 15.47. FeNa of 2.1% suggestive of intrinsic etiology, but patient is on HCTZ so this is likely inaccurate. urine urea pending. His sCr has increased to 3.09 today and His urine at bedside is concentrated today, He has trace pitting  edema in the lower extremities bilaterally, but the rest of his physical examination is reassuring this moring. We will increase fluid rate, and observe for signs of fluid overload.  - Increase IV fluids to 0.9% NS at 125 mL/hr - Trend renal function - Avoid nephrotoxic agents - Hold home lisinopril and HCTZ   # Hyponatremia:  Presented with a serum Na of 128 which corrects to 132 due to glucose of 277. Sosm of 292, urine osm of 396, Urine Na of 67, and uric acid of 6.6. Patient appears euvolemic on physical examination, Na continuing to improve to 132 and corrected to 133 with glucose of 165.  - Continue NS - Continue to trend electrolytes.    # Hx of CABG and AVR 09/2021:  - Pharmacy managing Lovenox  - Continue Crestor 40 mg  - Continue Carvedilol 25 mg twice daily    # Hx of CVA:  On warfarin but subtherapeutic INR of 1.3, pharmacy managing Lovenox and warfarin.    # HLD:  - Continue Crestor 40 mg    # Hypothyroidism:  - Continue Synthroid 100 mcg   # Obesity with OSA:  - Continue CPAP   # T2DM:  - Continue Lantus and SSI   Medication Issues; Preferred narcotic agents for pain control are hydromorphone, fentanyl, and methadone. Morphine should not be used.  Baclofen should be avoided Avoid oral sodium phosphate and magnesium citrate based laxatives / bowel preps   Recent Labs  Lab 11/09/21 0000 11/09/21 0810 11/10/21 0330  NA 128* 131* 132*  K 4.1 4.0 4.3  CL 99 102 103  CO2 18* 20* 20*  GLUCOSE 277* 241* 165*  BUN 45* 43* 45*  CREATININE 2.89* 2.78* 3.09*  CALCIUM 9.8 9.5 9.8   Recent Labs  Lab 11/09/21 0000 11/09/21 0810 11/10/21 0330  WBC 9.1 9.0 10.0  NEUTROABS 7.9* 7.6 8.0*  HGB 11.8* 11.1* 11.4*  HCT 36.6* 35.0* 34.4*  MCV 86.3 85.6 84.7  PLT 211 213 210    Maudie Mercury, MD IMTS, PGY-3 11/10/2021,8:09 AM

## 2021-11-10 NOTE — Progress Notes (Signed)
PROGRESS NOTE                                                                                                                                                                                                             Patient Demographics:    Robert [name], is a 61 y.o. male, DOB - [birth year], WYO:[id number]  Outpatient Primary MD for the patient is [name], [name], MD    LOS - 1  Admit date - 11/08/2021    No chief complaint on file.      Brief Narrative (HPI from H&P) - 61 year old male with a history of renal transplant at [facility], severe obstructive sleep apnea, status post AVR in December 2022, status post CABG in December 2022, hypertension, type 2 diabetes on insulin, recent stroke requiring systemic anticoagulation with Coumadin due to concern for thrombus, presents to the ER from the nursing home today.  Patient started having severe right lower quadrant pain, he was admitted for pyelonephritis and a transplant kidney.   Subjective:   Patient in bed, appears comfortable, denies any headache, no fever, no chest pain or pressure, no shortness of breath , no abdominal pain. No new focal weakness.   Assessment  & Plan :    Acute pyelonephritis with right lower quadrant abdominal pain at the site of transplant kidney.  In a patient with immunocompromise, currently placed on Zosyn empirically in the ER which will be continued, urine culture seems to be growing E. coli will follow, he does not appear toxic and is now pain-free, will trend WBC count procalcitonin and blood cultures closely.  2.  History of kidney transplant at [facility].  For now home dose prednisone should suffice, does not appear toxic blood pressure stable, have consulted nephrology to monitor what transplant medications he needs here.  Discussed with nephrology on 11/10/2021.  3.  AKI on CKD 3B.  Due to acute infection along with dehydration, also has  hyponatremia.  Baseline creatinine around 2.5.  Hydrate, hold HCTZ and ACE inhibitor , continue gentle IV fluids on 11/10/2021 urine still looks dark, nephrology on board.  4.  Hx of CABG and AVR in December 2022 by Dr. Caffie Pinto.  He is stable continue combination of high intensity statin and beta-blocker for secondary prevention also on Coumadin.  5.  HX of CVA.  Thought to be embolic.  On Coumadin, INR subtherapeutic for now Lovenox bridging pharmacy monitoring.  6.  Dyslipidemia.  On high intensity statin.  7.  Hypothyroidism.  Continue Synthroid.  8. Obesity with OSA.  BMI of 37, follow with PCP for weight loss.  Nighttime BiPAP.  9.  Recent history of COVID-19 infection.  Around 2 weeks ago. CT here is 28, continue isolation while here.  10. DM type II.  On Lantus and sliding scale monitor and adjust  Lab Results  Component Value Date   HGBA1C 7.1 (H) 10/03/2021   CBG (last 3)  Recent Labs    11/09/21 1626 11/09/21 2140 11/10/21 0740  GLUCAP 276* 216* 182*   Lab Results  Component Value Date   INR 1.7 (H) 11/10/2021   INR 1.3 (H) 11/09/2021   INR 2.4 (H) 10/26/2021         Condition - Fair  Family Communication  :  None present  Code Status :  Full  Consults  :  Renal  PUD Prophylaxis : Pepcid   Procedures  :     CT - Right lower quadrant renal transplant without hydronephrosis. No evidence of bowel obstruction. Normal appendix. Left colonic diverticulosis, without evidence of diverticulitis. Cholelithiasis, without associated inflammatory changes. Small bilateral pleural effusions, unchanged.       Disposition Plan  :    Status is: Inpatient - pyelonephritis and transplant kidney  DVT Prophylaxis  :    SCDs Start: 11/09/21 0741     Lab Results  Component Value Date   PLT 210 11/10/2021    Diet :  Diet Order             Diet renal/carb modified with fluid restriction Diet-HS Snack? Nothing; Fluid restriction: 2000 mL Fluid; Room service  appropriate? Yes; Fluid consistency: Thin  Diet effective now                    Inpatient Medications  Scheduled Meds:  amiodarone  200 mg Oral BID   amLODipine  10 mg Oral QHS   carvedilol  25 mg Oral BID WC   cholecalciferol  1,000 Units Oral Daily   doxazosin  2 mg Oral BID   DULoxetine  60 mg Oral Daily   enoxaparin (LOVENOX) injection  130 mg Subcutaneous Q12H   famotidine  20 mg Oral Daily   gabapentin  100 mg Oral BID   insulin aspart  0-15 Units Subcutaneous TID WC   insulin aspart  0-5 Units Subcutaneous QHS   insulin aspart  4 Units Subcutaneous TID WC   insulin glargine-yfgn  15 Units Subcutaneous Daily   levothyroxine  100 mcg Oral QAC breakfast   mycophenolate  1,000 mg Oral Q12H   predniSONE  5 mg Oral Q breakfast   rosuvastatin  40 mg Oral Daily   tamsulosin  0.8 mg Oral QHS   Warfarin - Pharmacist Dosing Inpatient   Does not apply q1600   Continuous Infusions:  sodium chloride 75 mL/hr at 11/10/21 5465   belatacept     piperacillin-tazobactam (ZOSYN)  IV 3.375 g (11/10/21 0843)   PRN Meds:.acetaminophen **OR** acetaminophen, albuterol, ALPRAZolam, hydrALAZINE, ondansetron **OR** ondansetron (ZOFRAN) IV, traZODone  Antibiotics  :    Anti-infectives (From admission, onward)    Start     Dose/Rate Route Frequency Ordered Stop   11/09/21 0800  piperacillin-tazobactam (ZOSYN) IVPB 3.375 g        3.375 g 12.5 mL/hr over 240 Minutes Intravenous Every 8 hours 11/09/21 0441  11/09/21 0000  vancomycin (VANCOCIN) IVPB 1000 mg/200 mL premix  Status:  Discontinued        1,000 mg 200 mL/hr over 60 Minutes Intravenous  Once 11/08/21 2347 11/08/21 2350   11/09/21 0000  piperacillin-tazobactam (ZOSYN) IVPB 3.375 g        3.375 g 12.5 mL/hr over 240 Minutes Intravenous  Once 11/08/21 2348 11/09/21 0529   11/09/21 0000  vancomycin (VANCOREADY) IVPB 2000 mg/400 mL  Status:  Discontinued        2,000 mg 200 mL/hr over 120 Minutes Intravenous  Once 11/08/21 2351  11/09/21 0428        Time Spent in minutes  30   Lala Lund M.D on 11/10/2021 at 9:09 AM  To page go to [email or web address]   [facility] -  Office  [phone]  See all Orders from today for further details    Objective:   Vitals:   11/09/21 1529 11/09/21 1558 11/09/21 2139 11/10/21 0520  BP: (!) 159/74 (!) 154/75 (!) 151/67 (!) 148/69  Pulse: (!) 59 60 64 61  Resp: 15 17 19 18   Temp: 98.7 F (37.1 C) 98.5 F (36.9 C) 98.7 F (37.1 C) 98.4 F (36.9 C)  TempSrc: Oral Oral Oral   SpO2: 95% 98% 98% 94%  Weight:  124.7 kg  124.6 kg  Height:  5\' 11"  (1.803 m)      Wt Readings from Last 3 Encounters:  11/10/21 124.6 kg  10/26/21 128.1 kg  10/18/21 130.5 kg     Intake/Output Summary (Last 24 hours) at 11/10/2021 0981 Last data filed at 11/10/2021 0800 Gross per 24 hour  Intake 1832.26 ml  Output 2000 ml  Net -167.74 ml     Physical Exam  Awake Alert, No new F.N deficits, Normal affect [place].AT,PERRAL Supple Neck, No JVD,   Symmetrical Chest wall movement, Good air movement bilaterally, CTAB RRR,No Gallops, +ve systolic murmur +ve B.Sounds, Abd Soft, No tenderness,   No Cyanosis, Clubbing or edema       Data Review:    CBC Recent Labs  Lab 11/09/21 0000 11/09/21 0810 11/10/21 0330  WBC 9.1 9.0 10.0  HGB 11.8* 11.1* 11.4*  HCT 36.6* 35.0* 34.4*  PLT 211 213 210  MCV 86.3 85.6 84.7  MCH 27.8 27.1 28.1  MCHC 32.2 31.7 33.1  RDW 14.6 14.6 14.6  LYMPHSABS 0.3* 0.4* 0.6*  MONOABS 0.7 0.8 1.0  EOSABS 0.1 0.1 0.2  BASOSABS 0.0 0.0 0.1    Electrolytes Recent Labs  Lab 11/09/21 0000 11/09/21 0810 11/10/21 0330  NA 128* 131* 132*  K 4.1 4.0 4.3  CL 99 102 103  CO2 18* 20* 20*  GLUCOSE 277* 241* 165*  BUN 45* 43* 45*  CREATININE 2.89* 2.78* 3.09*  CALCIUM 9.8 9.5 9.8  AST 34 27 27  ALT 47* 43 44  ALKPHOS 99 93 90  BILITOT 1.0 0.8 0.8  ALBUMIN 2.9* 2.6* 2.8*  MG  --  1.8 1.9  CRP  --   --  15.4*  PROCALCITON  --  0.91 0.95   LATICACIDVEN 0.9  --   --   INR 1.3*  --  1.7*  BNP  --   --  625.6*    ------------------------------------------------------------------------------------------------------------------ No results for input(s): CHOL, HDL, LDLCALC, TRIG, CHOLHDL, LDLDIRECT in the last 72 hours.  Lab Results  Component Value Date   HGBA1C 7.1 (H) 10/03/2021    No results for input(s): TSH, T4TOTAL, T3FREE, THYROIDAB in the last 72 hours.  Invalid input(s): FREET3 ------------------------------------------------------------------------------------------------------------------ ID Labs Recent Labs  Lab 11/09/21 0000 11/09/21 0810 11/10/21 0330  WBC 9.1 9.0 10.0  PLT 211 213 210  CRP  --   --  15.4*  PROCALCITON  --  0.91 0.95  LATICACIDVEN 0.9  --   --   CREATININE 2.89* 2.78* 3.09*   Cardiac Enzymes No results for input(s): CKMB, TROPONINI, MYOGLOBIN in the last 168 hours.  Invalid input(s): CK  Radiology Reports CT ABDOMEN PELVIS WO CONTRAST  Result Date: 11/09/2021 CLINICAL DATA:  Right lower quadrant abdominal pain, renal transplant EXAM: CT ABDOMEN AND PELVIS WITHOUT CONTRAST TECHNIQUE: Multidetector CT imaging of the abdomen and pelvis was performed following the standard protocol without IV contrast. RADIATION DOSE REDUCTION: This exam was performed according to the departmental dose-optimization program which includes automated exposure control, adjustment of the mA and/or kV according to patient size and/or use of iterative reconstruction technique. COMPARISON:  10/09/2021 FINDINGS: Lower chest: Small bilateral pleural effusions. Associated lower lobe atelectasis. This is grossly unchanged. Hepatobiliary: Unenhanced liver is unremarkable. Layering gallstones and/or gallbladder sludge (series 3/image 31), without associated laboratory changes. No intrahepatic or extrahepatic duct dilatation. Pancreas: Within normal limits. Spleen: Within normal limits. Adrenals/Urinary Tract: Adrenal  glands are within normal limits. Status post left nephrectomy. Mild right renal atrophy. No hydronephrosis. Right lower quadrant renal transplant without hydronephrosis. Bladder is mildly thick-walled although underdistended. Stomach/Bowel: Stomach is within normal limits. No evidence of bowel obstruction. Normal appendix (series 3/image 87). Left colonic diverticulosis, without evidence of diverticulitis. Vascular/Lymphatic: No evidence of abdominal aortic aneurysm. Atherosclerotic calcifications of the abdominal aorta and branch vessels. No suspicious abdominopelvic lymphadenopathy. Reproductive: Prostate is unremarkable. Other: No abdominopelvic ascites. Musculoskeletal: Degenerative changes of the visualized thoracolumbar spine, most prominent L1-2. IMPRESSION: Right lower quadrant renal transplant without hydronephrosis. No evidence of bowel obstruction. Normal appendix. Left colonic diverticulosis, without evidence of diverticulitis. Cholelithiasis, without associated inflammatory changes. Small bilateral pleural effusions, unchanged. Electronically Signed   By: Julian Hy M.D.   On: 11/09/2021 01:36   DG Chest Port 1 View  Result Date: 11/09/2021 CLINICAL DATA:  Questionable sepsis. EXAM: PORTABLE CHEST 1 VIEW COMPARISON:  Chest x-ray 10/25/2021. FINDINGS: Sternotomy wires and prosthetic heart valve are again seen. The cardiomediastinal silhouette is enlarged unchanged. There is central pulmonary vascular congestion. There is no lung consolidation, pleural effusion or pneumothorax identified. No acute fractures are seen. IMPRESSION: 1. Cardiomegaly with central pulmonary vascular congestion. Electronically Signed   By: Ronney Asters M.D.   On: 11/09/2021 00:24

## 2021-11-10 NOTE — Progress Notes (Signed)
ANTICOAGULATION/ANTIMICROBIAL  CONSULT NOTE - Initial Consult  Pharmacy Consult for Lovenox/Coumadin and Zosyn Indication:  AVR/CVA and cystitis  No Known Allergies  Vital Signs: Temp: 98.4 F (36.9 C) (02/03 0520) BP: 148/69 (02/03 0520) Pulse Rate: 61 (02/03 0520)  Labs: Recent Labs    11/09/21 0000 11/09/21 0810 11/10/21 0330  HGB 11.8* 11.1* 11.4*  HCT 36.6* 35.0* 34.4*  PLT 211 213 210  APTT 33  --   --   LABPROT 16.3*  --  19.8*  INR 1.3*  --  1.7*  CREATININE 2.89* 2.78* 3.09*     Estimated Creatinine Clearance: 33.3 mL/min (A) (by C-G formula based on SCr of 3.09 mg/dL (H)).   Medical History: Past Medical History:  Diagnosis Date   Anemia    low hgb at present   Aortic valve stenosis    Arthritis    FEET   Blood transfusion without reported diagnosis 1974   kidney removed - L   Cancer ([place])    skin ca right shoulder, plastic dsyplasia, pre-Ca polpys removed on Colonoscopy- 07/2014   Colon polyps 07/23/2014   Tubular adenomas x 5   Constipation    Coronary artery disease    Diabetes mellitus without complication ([place])    Type 2   Difficult intubation    unsure of actual problem but it was during the January 28, 2015 procedure.   Dysrhythmia    Eczema    ESRD (end stage renal disease) ([place])    hemodialysis 06/2014-02/2017, s/p living unrelated kidney transplant 02/26/17   GI bleed 07/26/2017   Headache(784.0)    slight dull h/a due kidney failure   Heart murmur    aortic stenosis (moderate-severe 04/2021)   Hyperlipidemia    Hypertension    SVT h/o , followed by Dr. Claiborne Billings   Hypertensive urgency 10/21/2021   Hypothyroidism    Macular degeneration    Neuropathy    right hand and both feet   PAT (paroxysmal atrial tachycardia) ([place])    Peritoneal dialysis catheter in place [facility]) 02/24/2015   PSVT (paroxysmal supraventricular tachycardia) ([facility])    SBO (small bowel obstruction) ([place]) 05/2015   Shortness of breath    with exertion   Sleep apnea  06/18/2011   split-night sleep study- [facility]., BiPAP- 13-16   Thyroid disease    Umbilical hernia    s/p repair 04/28/15    Assessment: 61yo male admitted for IV ABX for cystitis in setting of immunosuppression (h/o kidney transplant), also to continue Coumadin for AVR and h/o stroke with Lovenox bridge; of note pt was reportedly on Lovenox "bridge" at [facility] but per SNF MAR pt rec'd only one dose of Lovenox 40mg ; last dose of Coumadin taken 2/1.  INR up to 1.7 today. We will continue to bridge with lovenox. We will repeat the higher dose one more time today.   Urine culture is growing GNR.   Goal of Therapy:  INR 2-2.5 Anti-Xa level 0.6-1 units/ml 4hrs after LMWH dose given Monitor platelets by anticoagulation protocol: Yes   Plan:  Lovenox 130mg  SQ Q12H Coumadin 7.5mg  PO x1  Daily INR Zosyn 3.375g IV Q8H (extended infusion). Monitor CBC, Cx.  Onnie Boer, PharmD, BCIDP, AAHIVP, CPP Infectious Disease Pharmacist 11/10/2021 9:45 AM

## 2021-11-10 NOTE — Progress Notes (Signed)
Went in to speak with pt to address concerns from the time he came into the Ed to now. Pt upset that certain things didn't occur while he was in the Ed as far as speaking with Education officer, museum and therapy evaluations. He is upset that he is told by the Dr he is to continue contact/airborne precautions for 21 and not 10 even though he was already positive. Spoke with case worker and expressed pt concerns she tried to call the pt 2x not successful she will call him again at 1215. I am gathering form for pt to request information from [facility]. Pt bed remote to move bed wasn't working facilities came and looked new bed was offered to the pt. Pt didn't want bed switched out prior to me seeing him or when I went to see him. He will let me know when he wants to switch beds. Therapy evaluation will occur after pm he was  1 told by someone.

## 2021-11-11 LAB — PROTIME-INR
INR: 2.1 — ABNORMAL HIGH (ref 0.8–1.2)
Prothrombin Time: 23.5 seconds — ABNORMAL HIGH (ref 11.4–15.2)

## 2021-11-11 LAB — COMPREHENSIVE METABOLIC PANEL
ALT: 44 U/L (ref 0–44)
AST: 31 U/L (ref 15–41)
Albumin: 2.7 g/dL — ABNORMAL LOW (ref 3.5–5.0)
Alkaline Phosphatase: 89 U/L (ref 38–126)
Anion gap: 11 (ref 5–15)
BUN: 41 mg/dL — ABNORMAL HIGH (ref 8–23)
CO2: 18 mmol/L — ABNORMAL LOW (ref 22–32)
Calcium: 9.7 mg/dL (ref 8.9–10.3)
Chloride: 104 mmol/L (ref 98–111)
Creatinine, Ser: 2.9 mg/dL — ABNORMAL HIGH (ref 0.61–1.24)
GFR, Estimated: 24 mL/min — ABNORMAL LOW (ref >60)
Glucose, Bld: 172 mg/dL — ABNORMAL HIGH (ref 70–99)
Potassium: 4.2 mmol/L (ref 3.5–5.1)
Sodium: 133 mmol/L — ABNORMAL LOW (ref 135–145)
Total Bilirubin: 0.8 mg/dL (ref 0.3–1.2)
Total Protein: 5.5 g/dL — ABNORMAL LOW (ref 6.5–8.1)

## 2021-11-11 LAB — URINE CULTURE: Culture: >=100000 — AB

## 2021-11-11 LAB — CBC WITH DIFFERENTIAL/PLATELET
Abs Immature Granulocytes: 0.35 10*3/uL — ABNORMAL HIGH (ref 0.00–0.07)
Basophils Absolute: 0 10*3/uL (ref 0.0–0.1)
Basophils Relative: 0 %
Eosinophils Absolute: 0.2 10*3/uL (ref 0.0–0.5)
Eosinophils Relative: 2 %
HCT: 32.5 % — ABNORMAL LOW (ref 39.0–52.0)
Hemoglobin: 10.8 g/dL — ABNORMAL LOW (ref 13.0–17.0)
Immature Granulocytes: 4 %
Lymphocytes Relative: 7 %
Lymphs Abs: 0.6 10*3/uL — ABNORMAL LOW (ref 0.7–4.0)
MCH: 27.8 pg (ref 26.0–34.0)
MCHC: 33.2 g/dL (ref 30.0–36.0)
MCV: 83.8 fL (ref 80.0–100.0)
Monocytes Absolute: 0.8 10*3/uL (ref 0.1–1.0)
Monocytes Relative: 8 %
Neutro Abs: 7.5 10*3/uL (ref 1.7–7.7)
Neutrophils Relative %: 79 %
Platelets: 231 10*3/uL (ref 150–400)
RBC: 3.88 MIL/uL — ABNORMAL LOW (ref 4.22–5.81)
RDW: 14.5 % (ref 11.5–15.5)
WBC: 9.5 10*3/uL (ref 4.0–10.5)
nRBC: 0 % (ref 0.0–0.2)

## 2021-11-11 LAB — GLUCOSE, CAPILLARY
Glucose-Capillary: 153 mg/dL — ABNORMAL HIGH (ref 70–99)
Glucose-Capillary: 209 mg/dL — ABNORMAL HIGH (ref 70–99)
Glucose-Capillary: 210 mg/dL — ABNORMAL HIGH (ref 70–99)
Glucose-Capillary: 180 mg/dL — ABNORMAL HIGH (ref 70–99)

## 2021-11-11 LAB — MAGNESIUM: Magnesium: 1.8 mg/dL (ref 1.7–2.4)

## 2021-11-11 LAB — C-REACTIVE PROTEIN: CRP: 10.2 mg/dL — ABNORMAL HIGH (ref <1.0)

## 2021-11-11 LAB — BRAIN NATRIURETIC PEPTIDE: B Natriuretic Peptide: 667.8 pg/mL — ABNORMAL HIGH (ref 0.0–100.0)

## 2021-11-11 LAB — PROCALCITONIN: Procalcitonin: 0.65 ng/mL

## 2021-11-11 MED ORDER — SODIUM CHLORIDE 0.9 % IV SOLN
3.0000 g | Freq: Three times a day (TID) | INTRAVENOUS | Status: DC
  Administered 2021-11-11: 3 g via INTRAVENOUS
  Filled 2021-11-11: qty 8

## 2021-11-11 MED ORDER — LACTATED RINGERS IV SOLN: INTRAVENOUS | Status: AC

## 2021-11-11 MED ORDER — SODIUM CHLORIDE 0.9 % IV SOLN
1.0000 g | Freq: Two times a day (BID) | INTRAVENOUS | Status: DC
  Administered 2021-11-11 – 2021-11-16 (×10): 1 g via INTRAVENOUS
  Filled 2021-11-11 (×11): qty 1

## 2021-11-11 MED ORDER — WARFARIN SODIUM 5 MG PO TABS
5.0000 mg | ORAL_TABLET | Freq: Once | ORAL | Status: AC
  Administered 2021-11-11: 5 mg via ORAL
  Filled 2021-11-11: qty 1

## 2021-11-11 NOTE — Progress Notes (Signed)
Pt has home device and places self on/off when needed. RT will cont to monitor. ?

## 2021-11-11 NOTE — Progress Notes (Signed)
PROGRESS NOTE                                                                                                                                                                                                             Patient Demographics:    Robert [name], is a 61 y.o. male, DOB - Jul 07, 1961, KGM:[id number]  Outpatient Primary MD for the patient is [name], [name], MD    LOS - 2  Admit date - 11/08/2021    No chief complaint on file.      Brief Narrative (HPI from H&P) - 61 year old male with a history of renal transplant at [facility], severe obstructive sleep apnea, status post AVR in December 2022, status post CABG in December 2022, hypertension, type 2 diabetes on insulin, recent stroke requiring systemic anticoagulation with Coumadin due to concern for thrombus, presents to the ER from the nursing home today.  Patient started having severe right lower quadrant pain, he was admitted for pyelonephritis and a transplant kidney.   Subjective:   Patient in bed, appears comfortable, denies any headache, no fever, no chest pain or pressure, no shortness of breath , no abdominal pain. No new focal weakness.   Assessment  & Plan :    Acute pyelonephritis with right lower quadrant abdominal pain at the site of transplant kidney.  In a patient with immunocompromise, currently placed on Zosyn empirically in the ER which will be continued, urine culture seems to be growing ESBL Klebsiella culture and sensitivity noted, he does not appear toxic and is now pain-free, will trend WBC count procalcitonin and blood cultures closely.  Niccoli he has responded well to Zosyn Case discussed with ID will transition him to Carbapenem for a few days and thereafter few days of oral fosfomycin.  2.  History of kidney transplant at [facility].  For now home dose prednisone should suffice, does not appear toxic blood pressure stable, have consulted nephrology  to monitor what transplant medications he needs here.  Discussed with nephrology on 11/10/2021 and 11/11/2021, gentle hydration for another 500 cc today thereafter monitor.  3.  AKI on CKD 3B.  Due to acute infection along with dehydration, also has hyponatremia.  Baseline creatinine around 2.5.  Hydrated thus far about 3-1/2 L of fluid total, 500 cc more of fluid on 11/11/2021, hold HCTZ and ACE inhibitor, his  urine looks much lighter in color on 11/11/2021, case discussed with nephrologist Dr. Osborne Casco on 11/11/2021.    4.  Hx of CABG and AVR in December 2022 by Dr. Caffie Pinto.  He is stable continue combination of high intensity statin and beta-blocker for secondary prevention also on Coumadin.  5.  HX of CVA.  Thought to be embolic.  On Coumadin, INR not therapeutic stop Lovenox.  6.  Dyslipidemia.  On high intensity statin.  7.  Hypothyroidism.  Continue Synthroid.  8. Obesity with OSA.  BMI of 37, follow with PCP for weight loss.  Nighttime BiPAP.  9.  Recent history of COVID-19 infection.  Around 2 weeks ago. CT here is 28, continue isolation while here.  10. DM type II.  On Lantus and sliding scale monitor and adjust  Lab Results  Component Value Date   HGBA1C 7.1 (H) 10/03/2021   CBG (last 3)  Recent Labs    11/10/21 1624 11/10/21 2105 11/11/21 0758  GLUCAP 229* 196* 153*   Lab Results  Component Value Date   INR 2.1 (H) 11/11/2021   INR 1.7 (H) 11/10/2021   INR 1.3 (H) 11/09/2021         Condition - Fair  Family Communication  :  None present  Code Status :  Full  Consults  :  Renal  PUD Prophylaxis : Pepcid   Procedures  :     CT - Right lower quadrant renal transplant without hydronephrosis. No evidence of bowel obstruction. Normal appendix. Left colonic diverticulosis, without evidence of diverticulitis. Cholelithiasis, without associated inflammatory changes. Small bilateral pleural effusions, unchanged.       Disposition Plan  :    Status is: Inpatient -  pyelonephritis and transplant kidney  DVT Prophylaxis  :    SCDs Start: 11/09/21 0741 warfarin (COUMADIN) tablet 5 mg     Lab Results  Component Value Date   PLT 231 11/11/2021    Diet :  Diet Order             Diet renal/carb modified with fluid restriction Diet-HS Snack? Nothing; Fluid restriction: 2000 mL Fluid; Room service appropriate? Yes; Fluid consistency: Thin  Diet effective now                    Inpatient Medications  Scheduled Meds:  amiodarone  200 mg Oral BID   amLODipine  10 mg Oral QHS   carvedilol  25 mg Oral BID WC   cholecalciferol  1,000 Units Oral Daily   doxazosin  2 mg Oral BID   DULoxetine  60 mg Oral Daily   famotidine  20 mg Oral Daily   gabapentin  100 mg Oral BID   insulin aspart  0-15 Units Subcutaneous TID WC   insulin aspart  0-5 Units Subcutaneous QHS   insulin aspart  4 Units Subcutaneous TID WC   insulin glargine-yfgn  15 Units Subcutaneous Daily   levothyroxine  100 mcg Oral QAC breakfast   mycophenolate  1,000 mg Oral Q12H   predniSONE  5 mg Oral Q breakfast   rosuvastatin  40 mg Oral Daily   tamsulosin  0.8 mg Oral QHS   warfarin  5 mg Oral ONCE-1600   Warfarin - Pharmacist Dosing Inpatient   Does not apply q1600   Continuous Infusions:  ampicillin-sulbactam (UNASYN) IV     belatacept Stopped (11/10/21 1125)   PRN Meds:.acetaminophen **OR** acetaminophen, albuterol, ALPRAZolam, hydrALAZINE, ondansetron **OR** ondansetron (ZOFRAN) IV, traZODone  Antibiotics  :  Anti-infectives (From admission, onward)    Start     Dose/Rate Route Frequency Ordered Stop   11/11/21 0900  Ampicillin-Sulbactam (UNASYN) 3 g in sodium chloride 0.9 % 100 mL IVPB        3 g 200 mL/hr over 30 Minutes Intravenous Every 8 hours 11/11/21 0818     11/09/21 0800  piperacillin-tazobactam (ZOSYN) IVPB 3.375 g  Status:  Discontinued        3.375 g 12.5 mL/hr over 240 Minutes Intravenous Every 8 hours 11/09/21 0441 11/11/21 0818   11/09/21 0000   vancomycin (VANCOCIN) IVPB 1000 mg/200 mL premix  Status:  Discontinued        1,000 mg 200 mL/hr over 60 Minutes Intravenous  Once 11/08/21 2347 11/08/21 2350   11/09/21 0000  piperacillin-tazobactam (ZOSYN) IVPB 3.375 g        3.375 g 12.5 mL/hr over 240 Minutes Intravenous  Once 11/08/21 2348 11/09/21 0529   11/09/21 0000  vancomycin (VANCOREADY) IVPB 2000 mg/400 mL  Status:  Discontinued        2,000 mg 200 mL/hr over 120 Minutes Intravenous  Once 11/08/21 2351 11/09/21 0428        Time Spent in minutes  30   Lala Lund M.D on 11/11/2021 at 9:09 AM  To page go to [email or web address]   [facility] -  Office  [phone]  See all Orders from today for further details    Objective:   Vitals:   11/10/21 1624 11/10/21 2013 11/11/21 0242 11/11/21 0802  BP: (!) 146/76 137/63 (!) 155/64 (!) 146/65  Pulse: 63 62 64 (!) 59  Resp: 19  18 18   Temp: 98.5 F (36.9 C) 98.7 F (37.1 C) 98.6 F (37 C) 97.6 F (36.4 C)  TempSrc:  Oral Oral Oral  SpO2: 98% 98% 97% 96%  Weight:      Height:        Wt Readings from Last 3 Encounters:  11/10/21 124.6 kg  10/26/21 128.1 kg  10/18/21 130.5 kg     Intake/Output Summary (Last 24 hours) at 11/11/2021 0909 Last data filed at 11/11/2021 0900 Gross per 24 hour  Intake 1591.87 ml  Output 2300 ml  Net -708.13 ml     Physical Exam  Awake Alert, No new F.N deficits, Normal affect [place].AT,PERRAL Supple Neck, No JVD,   Symmetrical Chest wall movement, Good air movement bilaterally, CTAB RRR, positive systolic murmur +ve B.Sounds, Abd Soft, No tenderness,   No Cyanosis, Clubbing or edema      Data Review:    CBC Recent Labs  Lab 11/09/21 0000 11/09/21 0810 11/10/21 0330 11/11/21 0104  WBC 9.1 9.0 10.0 9.5  HGB 11.8* 11.1* 11.4* 10.8*  HCT 36.6* 35.0* 34.4* 32.5*  PLT 211 213 210 231  MCV 86.3 85.6 84.7 83.8  MCH 27.8 27.1 28.1 27.8  MCHC 32.2 31.7 33.1 33.2  RDW 14.6 14.6 14.6 14.5  LYMPHSABS 0.3* 0.4* 0.6* 0.6*   MONOABS 0.7 0.8 1.0 0.8  EOSABS 0.1 0.1 0.2 0.2  BASOSABS 0.0 0.0 0.1 0.0    Electrolytes Recent Labs  Lab 11/09/21 0000 11/09/21 0810 11/10/21 0330 11/11/21 0104  NA 128* 131* 132* 133*  K 4.1 4.0 4.3 4.2  CL 99 102 103 104  CO2 18* 20* 20* 18*  GLUCOSE 277* 241* 165* 172*  BUN 45* 43* 45* 41*  CREATININE 2.89* 2.78* 3.09* 2.90*  CALCIUM 9.8 9.5 9.8 9.7  AST 34 27 27 31   ALT 47* 43 44 44  ALKPHOS 99 93 90 89  BILITOT 1.0 0.8 0.8 0.8  ALBUMIN 2.9* 2.6* 2.8* 2.7*  MG  --  1.8 1.9 1.8  CRP  --   --  15.4* 10.2*  PROCALCITON  --  0.91 0.95 0.65  LATICACIDVEN 0.9  --   --   --   INR 1.3*  --  1.7* 2.1*  BNP  --   --  625.6* 667.8*    ------------------------------------------------------------------------------------------------------------------ No results for input(s): CHOL, HDL, LDLCALC, TRIG, CHOLHDL, LDLDIRECT in the last 72 hours.  Lab Results  Component Value Date   HGBA1C 7.1 (H) 10/03/2021    No results for input(s): TSH, T4TOTAL, T3FREE, THYROIDAB in the last 72 hours.  Invalid input(s): FREET3 ------------------------------------------------------------------------------------------------------------------ ID Labs Recent Labs  Lab 11/09/21 0000 11/09/21 0810 11/10/21 0330 11/11/21 0104  WBC 9.1 9.0 10.0 9.5  PLT 211 213 210 231  CRP  --   --  15.4* 10.2*  PROCALCITON  --  0.91 0.95 0.65  LATICACIDVEN 0.9  --   --   --   CREATININE 2.89* 2.78* 3.09* 2.90*   Cardiac Enzymes No results for input(s): CKMB, TROPONINI, MYOGLOBIN in the last 168 hours.  Invalid input(s): CK  Radiology Reports CT ABDOMEN PELVIS WO CONTRAST  Result Date: 11/09/2021 CLINICAL DATA:  Right lower quadrant abdominal pain, renal transplant EXAM: CT ABDOMEN AND PELVIS WITHOUT CONTRAST TECHNIQUE: Multidetector CT imaging of the abdomen and pelvis was performed following the standard protocol without IV contrast. RADIATION DOSE REDUCTION: This exam was performed according to  the departmental dose-optimization program which includes automated exposure control, adjustment of the mA and/or kV according to patient size and/or use of iterative reconstruction technique. COMPARISON:  10/09/2021 FINDINGS: Lower chest: Small bilateral pleural effusions. Associated lower lobe atelectasis. This is grossly unchanged. Hepatobiliary: Unenhanced liver is unremarkable. Layering gallstones and/or gallbladder sludge (series 3/image 31), without associated laboratory changes. No intrahepatic or extrahepatic duct dilatation. Pancreas: Within normal limits. Spleen: Within normal limits. Adrenals/Urinary Tract: Adrenal glands are within normal limits. Status post left nephrectomy. Mild right renal atrophy. No hydronephrosis. Right lower quadrant renal transplant without hydronephrosis. Bladder is mildly thick-walled although underdistended. Stomach/Bowel: Stomach is within normal limits. No evidence of bowel obstruction. Normal appendix (series 3/image 87). Left colonic diverticulosis, without evidence of diverticulitis. Vascular/Lymphatic: No evidence of abdominal aortic aneurysm. Atherosclerotic calcifications of the abdominal aorta and branch vessels. No suspicious abdominopelvic lymphadenopathy. Reproductive: Prostate is unremarkable. Other: No abdominopelvic ascites. Musculoskeletal: Degenerative changes of the visualized thoracolumbar spine, most prominent L1-2. IMPRESSION: Right lower quadrant renal transplant without hydronephrosis. No evidence of bowel obstruction. Normal appendix. Left colonic diverticulosis, without evidence of diverticulitis. Cholelithiasis, without associated inflammatory changes. Small bilateral pleural effusions, unchanged. Electronically Signed   By: Julian Hy M.D.   On: 11/09/2021 01:36   DG Chest Port 1 View  Result Date: 11/09/2021 CLINICAL DATA:  Questionable sepsis. EXAM: PORTABLE CHEST 1 VIEW COMPARISON:  Chest x-ray 10/25/2021. FINDINGS: Sternotomy wires and  prosthetic heart valve are again seen. The cardiomediastinal silhouette is enlarged unchanged. There is central pulmonary vascular congestion. There is no lung consolidation, pleural effusion or pneumothorax identified. No acute fractures are seen. IMPRESSION: 1. Cardiomegaly with central pulmonary vascular congestion. Electronically Signed   By: Ronney Asters M.D.   On: 11/09/2021 00:24

## 2021-11-11 NOTE — Progress Notes (Signed)
Nephrology Update Note  Brief note. Not planning to see patient today to limit COVID exposure. Crt improving to 2.9. Patient feels like baseline closer to 1.5-2 but may have a higher baseline after his surgery last month now 2-2.5. Patient feeling better today; uop really good. No SOB.  Awaiting culture sensitives. We will sign off at this time. Defer antibiotic narrowing to primary team based on sensitivities. Would plan for 14 day course.

## 2021-11-11 NOTE — Progress Notes (Addendum)
ANTICOAGULATION/ANTIMICROBIAL  CONSULT NOTE - Initial Consult  Pharmacy Consult for Lovenox/Coumadin Indication:  AVR/CVA and cystitis  No Known Allergies  Vital Signs: Temp: 97.6 F (36.4 C) (02/04 0802) Temp Source: Oral (02/04 0802) BP: 146/65 (02/04 0802) Pulse Rate: 59 (02/04 0802)  Labs: Recent Labs    11/09/21 0000 11/09/21 0810 11/10/21 0330 11/11/21 0104  HGB 11.8* 11.1* 11.4* 10.8*  HCT 36.6* 35.0* 34.4* 32.5*  PLT 211 213 210 231  APTT 33  --   --   --   LABPROT 16.3*  --  19.8* 23.5*  INR 1.3*  --  1.7* 2.1*  CREATININE 2.89* 2.78* 3.09* 2.90*     Estimated Creatinine Clearance: 35.5 mL/min (A) (by C-G formula based on SCr of 2.9 mg/dL (H)).   Medical History: Past Medical History:  Diagnosis Date   Anemia    low hgb at present   Aortic valve stenosis    Arthritis    FEET   Blood transfusion without reported diagnosis 1974   kidney removed - L   Cancer ([place])    skin ca right shoulder, plastic dsyplasia, pre-Ca polpys removed on Colonoscopy- 07/2014   Colon polyps 07/23/2014   Tubular adenomas x 5   Constipation    Coronary artery disease    Diabetes mellitus without complication ([place])    Type 2   Difficult intubation    unsure of actual problem but it was during the January 28, 2015 procedure.   Dysrhythmia    Eczema    ESRD (end stage renal disease) ([place])    hemodialysis 06/2014-02/2017, s/p living unrelated kidney transplant 02/26/17   GI bleed 07/26/2017   Headache(784.0)    slight dull h/a due kidney failure   Heart murmur    aortic stenosis (moderate-severe 04/2021)   Hyperlipidemia    Hypertension    SVT h/o , followed by Dr. Claiborne Billings   Hypertensive urgency 10/21/2021   Hypothyroidism    Macular degeneration    Neuropathy    right hand and both feet   PAT (paroxysmal atrial tachycardia) ([place])    Peritoneal dialysis catheter in place [facility]) 02/24/2015   PSVT (paroxysmal supraventricular tachycardia) ([facility])    SBO (small bowel obstruction)  ([place]) 05/2015   Shortness of breath    with exertion   Sleep apnea 06/18/2011   split-night sleep study- [facility]., BiPAP- 13-16   Thyroid disease    Umbilical hernia    s/p repair 04/28/15    Assessment: 61yo male admitted for IV ABX for cystitis in setting of immunosuppression (h/o kidney transplant), also to continue Coumadin for AVR and h/o stroke with Lovenox bridge; of note pt was reportedly on Lovenox "bridge" at [facility] but per SNF MAR pt rec'd only one dose of Lovenox 40mg ; last dose of Coumadin taken 2/1.  INR up to 2.1 today. Ok to stop lovenox bridging per Dr. Candiss Norse.   Urine culture grew out ESBL klebsiella but it's sens to unasyn and zosyn. Dr Candiss Norse would like to use merropenem before possible narrowing to Unasyn in a few days. Nephrology recommending 14d for pyelo.   Goal of Therapy:  INR 2-2.5 Anti-Xa level 0.6-1 units/ml 4hrs after LMWH dose given Monitor platelets by anticoagulation protocol: Yes   Plan:  Dc Lovenox Coumadin 5mg  PO x1  Daily INR Merrem 1g IV q12  Onnie Boer, PharmD, [place], AAHIVP, CPP Infectious Disease Pharmacist 11/11/2021 8:08 AM

## 2021-11-11 NOTE — Progress Notes (Signed)
While rounding on patient, he voiced concerns about the way things were not happening, I asked him to explain and if he wanted to speak with the charge nurse, he said no he would be speaking with patient relations, he goes on to say that we must not have any techs, I told him we did but I was there to assist him since I was already in the room. He insisted that I send the tech in. She later went in and assisted the patient with the things he wanted and needed. Patient asked for a xanax and his trazodone to help him calm down so that he could sleep because he has had a time since being admitted.

## 2021-11-12 LAB — CBC WITH DIFFERENTIAL/PLATELET
Abs Immature Granulocytes: 0.2 10*3/uL — ABNORMAL HIGH (ref 0.00–0.07)
Basophils Absolute: 0 10*3/uL (ref 0.0–0.1)
Basophils Relative: 0 %
Eosinophils Absolute: 0.1 10*3/uL (ref 0.0–0.5)
Eosinophils Relative: 1 %
HCT: 33 % — ABNORMAL LOW (ref 39.0–52.0)
Hemoglobin: 11 g/dL — ABNORMAL LOW (ref 13.0–17.0)
Lymphocytes Relative: 6 %
Lymphs Abs: 0.5 10*3/uL — ABNORMAL LOW (ref 0.7–4.0)
MCH: 28.1 pg (ref 26.0–34.0)
MCHC: 33.3 g/dL (ref 30.0–36.0)
MCV: 84.4 fL (ref 80.0–100.0)
Metamyelocytes Relative: 1 %
Monocytes Absolute: 0.6 10*3/uL (ref 0.1–1.0)
Monocytes Relative: 7 %
Myelocytes: 1 %
Neutro Abs: 6.6 10*3/uL (ref 1.7–7.7)
Neutrophils Relative %: 84 %
Platelets: 279 10*3/uL (ref 150–400)
RBC: 3.91 MIL/uL — ABNORMAL LOW (ref 4.22–5.81)
RDW: 14.5 % (ref 11.5–15.5)
WBC: 7.9 10*3/uL (ref 4.0–10.5)
nRBC: 0 % (ref 0.0–0.2)
nRBC: 0 /100 WBC

## 2021-11-12 LAB — COMPREHENSIVE METABOLIC PANEL
ALT: 40 U/L (ref 0–44)
AST: 20 U/L (ref 15–41)
Albumin: 2.6 g/dL — ABNORMAL LOW (ref 3.5–5.0)
Alkaline Phosphatase: 86 U/L (ref 38–126)
Anion gap: 10 (ref 5–15)
BUN: 34 mg/dL — ABNORMAL HIGH (ref 8–23)
CO2: 20 mmol/L — ABNORMAL LOW (ref 22–32)
Calcium: 9.7 mg/dL (ref 8.9–10.3)
Chloride: 105 mmol/L (ref 98–111)
Creatinine, Ser: 2.57 mg/dL — ABNORMAL HIGH (ref 0.61–1.24)
GFR, Estimated: 27 mL/min — ABNORMAL LOW (ref >60)
Glucose, Bld: 185 mg/dL — ABNORMAL HIGH (ref 70–99)
Potassium: 4.1 mmol/L (ref 3.5–5.1)
Sodium: 135 mmol/L (ref 135–145)
Total Bilirubin: 0.5 mg/dL (ref 0.3–1.2)
Total Protein: 5.4 g/dL — ABNORMAL LOW (ref 6.5–8.1)

## 2021-11-12 LAB — GLUCOSE, CAPILLARY
Glucose-Capillary: 156 mg/dL — ABNORMAL HIGH (ref 70–99)
Glucose-Capillary: 193 mg/dL — ABNORMAL HIGH (ref 70–99)
Glucose-Capillary: 220 mg/dL — ABNORMAL HIGH (ref 70–99)
Glucose-Capillary: 227 mg/dL — ABNORMAL HIGH (ref 70–99)

## 2021-11-12 LAB — PROCALCITONIN: Procalcitonin: 0.37 ng/mL

## 2021-11-12 LAB — BRAIN NATRIURETIC PEPTIDE: B Natriuretic Peptide: 629.2 pg/mL — ABNORMAL HIGH (ref 0.0–100.0)

## 2021-11-12 LAB — MAGNESIUM: Magnesium: 1.9 mg/dL (ref 1.7–2.4)

## 2021-11-12 LAB — C-REACTIVE PROTEIN: CRP: 5.9 mg/dL — ABNORMAL HIGH (ref <1.0)

## 2021-11-12 LAB — PROTIME-INR
INR: 2.5 — ABNORMAL HIGH (ref 0.8–1.2)
Prothrombin Time: 27 seconds — ABNORMAL HIGH (ref 11.4–15.2)

## 2021-11-12 MED ORDER — WARFARIN SODIUM 3 MG PO TABS
3.0000 mg | ORAL_TABLET | Freq: Once | ORAL | Status: AC
  Administered 2021-11-12: 3 mg via ORAL
  Filled 2021-11-12: qty 1

## 2021-11-12 MED ORDER — HYDRALAZINE HCL 25 MG PO TABS
25.0000 mg | ORAL_TABLET | Freq: Three times a day (TID) | ORAL | Status: DC
  Administered 2021-11-12 – 2021-11-13 (×3): 25 mg via ORAL
  Filled 2021-11-12 (×3): qty 1

## 2021-11-12 NOTE — Progress Notes (Addendum)
ANTICOAGULATION/ANTIMICROBIAL CONSULT NOTE - Initial Consult  Pharmacy Consult for Coumadin Indication:  AVR/CVA and cystitis  No Known Allergies  Vital Signs: Temp: 98.3 F (36.8 C) (02/05 0600) Temp Source: Oral (02/05 0600) BP: 148/91 (02/05 0600) Pulse Rate: 65 (02/04 2130)  Labs: Recent Labs    11/10/21 0330 11/11/21 0104 11/12/21 0151  HGB 11.4* 10.8* 11.0*  HCT 34.4* 32.5* 33.0*  PLT 210 231 279  LABPROT 19.8* 23.5* 27.0*  INR 1.7* 2.1* 2.5*  CREATININE 3.09* 2.90* 2.57*     Estimated Creatinine Clearance: 40 mL/min (A) (by C-G formula based on SCr of 2.57 mg/dL (H)).   Medical History: Past Medical History:  Diagnosis Date   Anemia    low hgb at present   Aortic valve stenosis    Arthritis    FEET   Blood transfusion without reported diagnosis 1974   kidney removed - L   Cancer ([place])    skin ca right shoulder, plastic dsyplasia, pre-Ca polpys removed on Colonoscopy- 07/2014   Colon polyps 07/23/2014   Tubular adenomas x 5   Constipation    Coronary artery disease    Diabetes mellitus without complication ([place])    Type 2   Difficult intubation    unsure of actual problem but it was during the January 28, 2015 procedure.   Dysrhythmia    Eczema    ESRD (end stage renal disease) ([place])    hemodialysis 06/2014-02/2017, s/p living unrelated kidney transplant 02/26/17   GI bleed 07/26/2017   Headache(784.0)    slight dull h/a due kidney failure   Heart murmur    aortic stenosis (moderate-severe 04/2021)   Hyperlipidemia    Hypertension    SVT h/o , followed by Dr. Claiborne Billings   Hypertensive urgency 10/21/2021   Hypothyroidism    Macular degeneration    Neuropathy    right hand and both feet   PAT (paroxysmal atrial tachycardia) ([place])    Peritoneal dialysis catheter in place [facility]) 02/24/2015   PSVT (paroxysmal supraventricular tachycardia) ([facility])    SBO (small bowel obstruction) ([place]) 05/2015   Shortness of breath    with exertion   Sleep apnea 06/18/2011    split-night sleep study- [facility]., BiPAP- 13-16   Thyroid disease    Umbilical hernia    s/p repair 04/28/15    Assessment: 61yo male admitted for IV ABX for cystitis in setting of immunosuppression (h/o kidney transplant), also to continue Coumadin for AVR and h/o stroke with Lovenox bridge; of note pt was reportedly on Lovenox "bridge" at [facility] but per SNF MAR pt rec'd only one dose of Lovenox 40mg ; last dose of Coumadin taken 2/1.  INR up to 2.5 today. Likely discharge tomorrow. Prob can resume home dose.    Urine culture grew out ESBL klebsiella but it's sens to unasyn and zosyn. Dr Candiss Norse would like to use merropenem before possible narrowing to Unasyn/Augmentin in a few days. Nephrology recommending 14d for pyelo.   Goal of Therapy:  INR 2-2.5 Anti-Xa level 0.6-1 units/ml 4hrs after LMWH dose given Monitor platelets by anticoagulation protocol: Yes   Plan:  Coumadin 3mg  PO x1  Daily INR Merrem 1g IV q12  Onnie Boer, PharmD, [place], AAHIVP, CPP Infectious Disease Pharmacist 11/12/2021 8:42 AM

## 2021-11-12 NOTE — TOC Progression Note (Signed)
Transition of Care [facility]) - Initial/Assessment Note    Patient Details  Name: Robert [name] MRN: [id number] Date of Birth: [birth year]  [facility]) CM/SW Contact:    Milinda Antis, LCSWA Phone Number: 11/12/2021, 1:10 PM  Clinical Narrative:                  CSW sent referral to [facility] for review.  The social worker will be back in the office tomorrow.         Patient Goals and CMS Choice        Expected Discharge Plan and Services                                                Prior Living Arrangements/Services                       Activities of Daily Living Home Assistive Devices/Equipment: BIPAP, Eyeglasses ADL Screening (condition at time of admission) Patient's cognitive ability adequate to safely complete daily activities?: Yes Is the patient deaf or have difficulty hearing?: No Does the patient have difficulty seeing, even when wearing glasses/contacts?: No Does the patient have difficulty concentrating, remembering, or making decisions?: Yes Patient able to express need for assistance with ADLs?: Yes Does the patient have difficulty dressing or bathing?: Yes Independently performs ADLs?: No Communication: Independent Dressing (OT): Needs assistance Is this a change from baseline?: Pre-admission baseline Grooming: Independent Is this a change from baseline?: Pre-admission baseline Feeding: Independent Bathing: Needs assistance Is this a change from baseline?: Pre-admission baseline Toileting: Needs assistance Is this a change from baseline?: Pre-admission baseline In/Out Bed: Needs assistance Is this a change from baseline?: Pre-admission baseline Walks in Home: Needs assistance Is this a change from baseline?: Pre-admission baseline Does the patient have difficulty walking or climbing stairs?: Yes Weakness of Legs: Both Weakness of Arms/Hands: None  Permission Sought/Granted                   Emotional Assessment              Admission diagnosis:  Acute cystitis without hematuria [N30.00] Urinary tract infection with hematuria, site unspecified [N39.0, R31.9] Patient Active Problem List   Diagnosis Date Noted   Acute cystitis without hematuria 11/09/2021   Anticoagulated on Coumadin 11/09/2021   Hyponatremia 11/09/2021   Cerebrovascular accident (CVA) due to embolism of precerebral artery ([place])    S/P AVR (aortic valve replacement) 10/05/2021   Exertional dyspnea    Fissure of skin 03/08/2020   Kidney transplant status 07/26/2017   Inguinal hernia without obstruction or gangrene 06/30/2017   Tremor of both hands 06/29/2017   Need for prophylactic immunotherapy 05/20/2017   Hydrocele, acquired 04/07/2017   Scrotal edema 03/14/2017   H/O diabetic neuropathy 03/01/2017   Immunosuppressed status ([place]) 02/26/2017   Prophylactic antibiotic 02/26/2017   Benign neoplasm of transverse colon    Diverticulosis of colon without hemorrhage    Morbid obesity ([place]) 02/03/2016   Type 2 diabetes mellitus with hyperglycemia, with long-term current use of insulin ([place]) 07/14/2015   HTN (hypertension) 05/11/2015   Chronic renal failure    Anemia in chronic kidney disease 03/30/2015   Long term (current) use of insulin ([place]) 03/30/2015   Benign essential HTN 07/30/2014   Protein-calorie malnutrition, severe ([place]) 06/15/2014   Stage  3b chronic kidney disease (CKD) ([place]) 03/22/2014   Fatigue 01/08/2014   H/O unilateral nephrectomy 09/06/2013   Diabetes mellitus type 2 in obese ([place]) 09/06/2013   SVT (supraventricular tachycardia) ([place]) 09/06/2013   Lower extremity edema 09/06/2013   Severe obstructive sleep apnea 09/06/2013   Hypothyroidism 09/06/2013   Hyperlipidemia with target LDL less than 70 09/06/2013   PCP:  [name], [name], MD Pharmacy:   [id number] - [place], [place] - [address] AT [address] [place]  [place] [address] Phone: [phone] Fax: [phone]     Social Determinants of Health (SDOH) Interventions    Readmission Risk Interventions Readmission Risk Prevention Plan 11/10/2021  Transportation Screening Complete  Medication Review Press photographer) Referral to Pharmacy  PCP or Specialist appointment within 3-5 days of discharge Not Complete  PCP/Specialist Appt Not Complete comments Patient not ready for discharge  [place] or [place] Not Complete  HRI or Home Care Consult Pt Refusal Comments possible SNF placement  SW Recovery Care/Counseling Consult Complete  Palliative Care Screening Not Applicable  Skilled Nursing Facility Not Complete  SNF Comments pending PT/ OT orders  Some recent data might be hidden

## 2021-11-12 NOTE — Progress Notes (Signed)
PROGRESS NOTE                                                                                                                                                                                                             Patient Demographics:    Robert [name], is a 61 y.o. male, DOB - August 12, 1961, IZT:[id number]  Outpatient Primary MD for the patient is [name], [name], MD    LOS - 3  Admit date - 11/08/2021    No chief complaint on file.      Brief Narrative (HPI from H&P) - 61 year old male with a history of renal transplant at [facility], severe obstructive sleep apnea, status post AVR in December 2022, status post CABG in December 2022, hypertension, type 2 diabetes on insulin, recent stroke requiring systemic anticoagulation with Coumadin due to concern for thrombus, presents to the ER from the nursing home today.  Patient started having severe right lower quadrant pain, he was admitted for pyelonephritis and a transplant kidney.   Subjective:   Patient in bed, appears comfortable, denies any headache, no fever, no chest pain or pressure, no shortness of breath , no abdominal pain. No new focal weakness.   Assessment  & Plan :    Acute pyelonephritis with right lower quadrant abdominal pain at the site of transplant kidney.  In a patient with immunocompromise, currently placed on Zosyn empirically in the ER which will be continued, urine culture seems to be growing ESBL Klebsiella culture and sensitivity noted, he does not appear toxic and is now pain-free, will trend WBC count procalcitonin and blood cultures closely.  Clinically he has responded well to Zosyn Case discussed with ID will transition him to Carbapenem for a few days and thereafter few days of oral fosfomycin or Augmentin.  2.  History of kidney transplant at [facility].  For now home dose prednisone should suffice, does not appear toxic blood pressure stable, have  consulted nephrology to monitor what transplant medications he needs here.  Discussed with nephrology on 11/10/2021 and 11/11/2021, gentle hydration for another 500 cc today thereafter monitor.  3.  AKI on CKD 3B.  Due to acute infection along with dehydration, also has hyponatremia.  Baseline creatinine around 2.5.  He has quickly hydrated and renal function now at baseline, hold HCTZ and ACE inhibitor, his urine looks much lighter  in color on 11/11/2021, case discussed with nephrologist Dr. Osborne Casco on 11/11/2021.    4.  Hx of CABG and AVR in December 2022 by Dr. Caffie Pinto.  He is stable continue combination of high intensity statin and beta-blocker for secondary prevention also on Coumadin.  5.  HX of CVA.  Thought to be embolic.  On Coumadin, INR not therapeutic stop Lovenox.  6.  Dyslipidemia.  On high intensity statin.  7.  Hypothyroidism.  Continue Synthroid.  8. Obesity with OSA.  BMI of 37, follow with PCP for weight loss.  Nighttime BiPAP.  9.  Recent history of COVID-19 infection.  Around 2 weeks ago. CT here is 28, continue isolation while here.  10.HTN - on combination of Coreg, Norvasc continuing to hold ACE inhibitor due to recent AKI, low-dose hydralazine added in place.  Will monitor   11. DM type II.  On Lantus and sliding scale monitor and adjust  Lab Results  Component Value Date   HGBA1C 7.1 (H) 10/03/2021   CBG (last 3)  Recent Labs    11/11/21 1656 11/11/21 2058 11/12/21 0739  GLUCAP 210* 180* 156*   Lab Results  Component Value Date   INR 2.5 (H) 11/12/2021   INR 2.1 (H) 11/11/2021   INR 1.7 (H) 11/10/2021        Condition - Fair  Family Communication  :  None present  Code Status :  Full  Consults  :  Renal  PUD Prophylaxis : Pepcid   Procedures  :     CT - Right lower quadrant renal transplant without hydronephrosis. No evidence of bowel obstruction. Normal appendix. Left colonic diverticulosis, without evidence of diverticulitis. Cholelithiasis,  without associated inflammatory changes. Small bilateral pleural effusions, unchanged.       Disposition Plan  :    Status is: Inpatient - pyelonephritis and transplant kidney  DVT Prophylaxis  :    SCDs Start: 11/09/21 0741     Lab Results  Component Value Date   PLT 279 11/12/2021    Diet :  Diet Order             Diet renal/carb modified with fluid restriction Diet-HS Snack? Nothing; Fluid restriction: 2000 mL Fluid; Room service appropriate? Yes; Fluid consistency: Thin  Diet effective now                    Inpatient Medications  Scheduled Meds:  amiodarone  200 mg Oral BID   amLODipine  10 mg Oral QHS   carvedilol  25 mg Oral BID WC   cholecalciferol  1,000 Units Oral Daily   doxazosin  2 mg Oral BID   DULoxetine  60 mg Oral Daily   famotidine  20 mg Oral Daily   gabapentin  100 mg Oral BID   hydrALAZINE  25 mg Oral Q8H   insulin aspart  0-15 Units Subcutaneous TID WC   insulin aspart  0-5 Units Subcutaneous QHS   insulin aspart  4 Units Subcutaneous TID WC   insulin glargine-yfgn  15 Units Subcutaneous Daily   levothyroxine  100 mcg Oral QAC breakfast   mycophenolate  1,000 mg Oral Q12H   predniSONE  5 mg Oral Q breakfast   rosuvastatin  40 mg Oral Daily   tamsulosin  0.8 mg Oral QHS   Warfarin - Pharmacist Dosing Inpatient   Does not apply q1600   Continuous Infusions:  belatacept Stopped (11/10/21 1125)   meropenem (MERREM) IV 1 g (11/12/21 0243)  PRN Meds:.acetaminophen **OR** acetaminophen, albuterol, ALPRAZolam, hydrALAZINE, ondansetron **OR** ondansetron (ZOFRAN) IV, traZODone  Antibiotics  :    Anti-infectives (From admission, onward)    Start     Dose/Rate Route Frequency Ordered Stop   11/11/21 1600  meropenem (MERREM) 1 g in sodium chloride 0.9 % 100 mL IVPB        1 g 200 mL/hr over 30 Minutes Intravenous Every 12 hours 11/11/21 0941     11/11/21 0900  Ampicillin-Sulbactam (UNASYN) 3 g in sodium chloride 0.9 % 100 mL IVPB   Status:  Discontinued        3 g 200 mL/hr over 30 Minutes Intravenous Every 8 hours 11/11/21 0818 11/11/21 0941   11/09/21 0800  piperacillin-tazobactam (ZOSYN) IVPB 3.375 g  Status:  Discontinued        3.375 g 12.5 mL/hr over 240 Minutes Intravenous Every 8 hours 11/09/21 0441 11/11/21 0818   11/09/21 0000  vancomycin (VANCOCIN) IVPB 1000 mg/200 mL premix  Status:  Discontinued        1,000 mg 200 mL/hr over 60 Minutes Intravenous  Once 11/08/21 2347 11/08/21 2350   11/09/21 0000  piperacillin-tazobactam (ZOSYN) IVPB 3.375 g        3.375 g 12.5 mL/hr over 240 Minutes Intravenous  Once 11/08/21 2348 11/09/21 0529   11/09/21 0000  vancomycin (VANCOREADY) IVPB 2000 mg/400 mL  Status:  Discontinued        2,000 mg 200 mL/hr over 120 Minutes Intravenous  Once 11/08/21 2351 11/09/21 0428        Time Spent in minutes  30   Lala Lund M.D on 11/12/2021 at 8:27 AM  To page go to [email or web address]   [facility] -  Office  [phone]  See all Orders from today for further details    Objective:   Vitals:   11/11/21 1658 11/11/21 2055 11/11/21 2130 11/12/21 0600  BP: (!) 146/71 (!) 147/60  (!) 148/91  Pulse: (!) 58 60 65   Resp: 16 19 16 18   Temp: 98.1 F (36.7 C) 98 F (36.7 C)  98.3 F (36.8 C)  TempSrc: Oral Oral  Oral  SpO2: 99% 96% 95% 97%  Weight:      Height:        Wt Readings from Last 3 Encounters:  11/10/21 124.6 kg  10/26/21 128.1 kg  10/18/21 130.5 kg     Intake/Output Summary (Last 24 hours) at 11/12/2021 0827 Last data filed at 11/12/2021 0600 Gross per 24 hour  Intake 1280 ml  Output 1000 ml  Net 280 ml     Physical Exam  Awake Alert, No new F.N deficits, Normal affect [place].AT,PERRAL Supple Neck, No JVD,   Symmetrical Chest wall movement, Good air movement bilaterally, CTAB RRR,No Gallops, Rubs or new Murmurs,  +ve B.Sounds, Abd Soft, No tenderness,   No Cyanosis, Clubbing or edema     Data Review:    CBC Recent Labs  Lab  11/09/21 0000 11/09/21 0810 11/10/21 0330 11/11/21 0104 11/12/21 0151  WBC 9.1 9.0 10.0 9.5 7.9  HGB 11.8* 11.1* 11.4* 10.8* 11.0*  HCT 36.6* 35.0* 34.4* 32.5* 33.0*  PLT 211 213 210 231 279  MCV 86.3 85.6 84.7 83.8 84.4  MCH 27.8 27.1 28.1 27.8 28.1  MCHC 32.2 31.7 33.1 33.2 33.3  RDW 14.6 14.6 14.6 14.5 14.5  LYMPHSABS 0.3* 0.4* 0.6* 0.6* 0.5*  MONOABS 0.7 0.8 1.0 0.8 0.6  EOSABS 0.1 0.1 0.2 0.2 0.1  BASOSABS 0.0 0.0 0.1 0.0 0.0  Electrolytes Recent Labs  Lab 11/09/21 0000 11/09/21 0810 11/10/21 0330 11/11/21 0104 11/12/21 0151  NA 128* 131* 132* 133* 135  K 4.1 4.0 4.3 4.2 4.1  CL 99 102 103 104 105  CO2 18* 20* 20* 18* 20*  GLUCOSE 277* 241* 165* 172* 185*  BUN 45* 43* 45* 41* 34*  CREATININE 2.89* 2.78* 3.09* 2.90* 2.57*  CALCIUM 9.8 9.5 9.8 9.7 9.7  AST 34 27 27 31 20   ALT 47* 43 44 44 40  ALKPHOS 99 93 90 89 86  BILITOT 1.0 0.8 0.8 0.8 0.5  ALBUMIN 2.9* 2.6* 2.8* 2.7* 2.6*  MG  --  1.8 1.9 1.8 1.9  CRP  --   --  15.4* 10.2* 5.9*  PROCALCITON  --  0.91 0.95 0.65 0.37  LATICACIDVEN 0.9  --   --   --   --   INR 1.3*  --  1.7* 2.1* 2.5*  BNP  --   --  625.6* 667.8* 629.2*    ------------------------------------------------------------------------------------------------------------------ No results for input(s): CHOL, HDL, LDLCALC, TRIG, CHOLHDL, LDLDIRECT in the last 72 hours.  Lab Results  Component Value Date   HGBA1C 7.1 (H) 10/03/2021    No results for input(s): TSH, T4TOTAL, T3FREE, THYROIDAB in the last 72 hours.  Invalid input(s): FREET3 ------------------------------------------------------------------------------------------------------------------ ID Labs Recent Labs  Lab 11/09/21 0000 11/09/21 0810 11/10/21 0330 11/11/21 0104 11/12/21 0151  WBC 9.1 9.0 10.0 9.5 7.9  PLT 211 213 210 231 279  CRP  --   --  15.4* 10.2* 5.9*  PROCALCITON  --  0.91 0.95 0.65 0.37  LATICACIDVEN 0.9  --   --   --   --   CREATININE 2.89* 2.78* 3.09*  2.90* 2.57*   Cardiac Enzymes No results for input(s): CKMB, TROPONINI, MYOGLOBIN in the last 168 hours.  Invalid input(s): CK  Radiology Reports CT ABDOMEN PELVIS WO CONTRAST  Result Date: 11/09/2021 CLINICAL DATA:  Right lower quadrant abdominal pain, renal transplant EXAM: CT ABDOMEN AND PELVIS WITHOUT CONTRAST TECHNIQUE: Multidetector CT imaging of the abdomen and pelvis was performed following the standard protocol without IV contrast. RADIATION DOSE REDUCTION: This exam was performed according to the departmental dose-optimization program which includes automated exposure control, adjustment of the mA and/or kV according to patient size and/or use of iterative reconstruction technique. COMPARISON:  10/09/2021 FINDINGS: Lower chest: Small bilateral pleural effusions. Associated lower lobe atelectasis. This is grossly unchanged. Hepatobiliary: Unenhanced liver is unremarkable. Layering gallstones and/or gallbladder sludge (series 3/image 31), without associated laboratory changes. No intrahepatic or extrahepatic duct dilatation. Pancreas: Within normal limits. Spleen: Within normal limits. Adrenals/Urinary Tract: Adrenal glands are within normal limits. Status post left nephrectomy. Mild right renal atrophy. No hydronephrosis. Right lower quadrant renal transplant without hydronephrosis. Bladder is mildly thick-walled although underdistended. Stomach/Bowel: Stomach is within normal limits. No evidence of bowel obstruction. Normal appendix (series 3/image 87). Left colonic diverticulosis, without evidence of diverticulitis. Vascular/Lymphatic: No evidence of abdominal aortic aneurysm. Atherosclerotic calcifications of the abdominal aorta and branch vessels. No suspicious abdominopelvic lymphadenopathy. Reproductive: Prostate is unremarkable. Other: No abdominopelvic ascites. Musculoskeletal: Degenerative changes of the visualized thoracolumbar spine, most prominent L1-2. IMPRESSION: Right lower quadrant  renal transplant without hydronephrosis. No evidence of bowel obstruction. Normal appendix. Left colonic diverticulosis, without evidence of diverticulitis. Cholelithiasis, without associated inflammatory changes. Small bilateral pleural effusions, unchanged. Electronically Signed   By: Julian Hy M.D.   On: 11/09/2021 01:36   DG Chest Port 1 View  Result Date: 11/09/2021 CLINICAL DATA:  Questionable sepsis. EXAM: PORTABLE CHEST 1 VIEW COMPARISON:  Chest x-ray 10/25/2021. FINDINGS: Sternotomy wires and prosthetic heart valve are again seen. The cardiomediastinal silhouette is enlarged unchanged. There is central pulmonary vascular congestion. There is no lung consolidation, pleural effusion or pneumothorax identified. No acute fractures are seen. IMPRESSION: 1. Cardiomegaly with central pulmonary vascular congestion. Electronically Signed   By: Ronney Asters M.D.   On: 11/09/2021 00:24

## 2021-11-13 LAB — CBC WITH DIFFERENTIAL/PLATELET
Abs Immature Granulocytes: 0.71 10*3/uL — ABNORMAL HIGH (ref 0.00–0.07)
Basophils Absolute: 0.1 10*3/uL (ref 0.0–0.1)
Basophils Relative: 1 %
Eosinophils Absolute: 0.3 10*3/uL (ref 0.0–0.5)
Eosinophils Relative: 4 %
HCT: 34.7 % — ABNORMAL LOW (ref 39.0–52.0)
Hemoglobin: 11.5 g/dL — ABNORMAL LOW (ref 13.0–17.0)
Immature Granulocytes: 9 %
Lymphocytes Relative: 9 %
Lymphs Abs: 0.7 10*3/uL (ref 0.7–4.0)
MCH: 27.5 pg (ref 26.0–34.0)
MCHC: 33.1 g/dL (ref 30.0–36.0)
MCV: 83 fL (ref 80.0–100.0)
Monocytes Absolute: 0.5 10*3/uL (ref 0.1–1.0)
Monocytes Relative: 7 %
Neutro Abs: 5.7 10*3/uL (ref 1.7–7.7)
Neutrophils Relative %: 70 %
Platelets: 247 10*3/uL (ref 150–400)
RBC: 4.18 MIL/uL — ABNORMAL LOW (ref 4.22–5.81)
RDW: 14.4 % (ref 11.5–15.5)
Smear Review: NORMAL
WBC: 8.1 10*3/uL (ref 4.0–10.5)
nRBC: 0 % (ref 0.0–0.2)

## 2021-11-13 LAB — GLUCOSE, CAPILLARY
Glucose-Capillary: 190 mg/dL — ABNORMAL HIGH (ref 70–99)
Glucose-Capillary: 224 mg/dL — ABNORMAL HIGH (ref 70–99)
Glucose-Capillary: 233 mg/dL — ABNORMAL HIGH (ref 70–99)
Glucose-Capillary: 266 mg/dL — ABNORMAL HIGH (ref 70–99)

## 2021-11-13 LAB — COMPREHENSIVE METABOLIC PANEL
ALT: 40 U/L (ref 0–44)
AST: 24 U/L (ref 15–41)
Albumin: 2.8 g/dL — ABNORMAL LOW (ref 3.5–5.0)
Alkaline Phosphatase: 83 U/L (ref 38–126)
Anion gap: 10 (ref 5–15)
BUN: 28 mg/dL — ABNORMAL HIGH (ref 8–23)
CO2: 20 mmol/L — ABNORMAL LOW (ref 22–32)
Calcium: 9.7 mg/dL (ref 8.9–10.3)
Chloride: 105 mmol/L (ref 98–111)
Creatinine, Ser: 2.34 mg/dL — ABNORMAL HIGH (ref 0.61–1.24)
GFR, Estimated: 31 mL/min — ABNORMAL LOW (ref >60)
Glucose, Bld: 192 mg/dL — ABNORMAL HIGH (ref 70–99)
Potassium: 3.7 mmol/L (ref 3.5–5.1)
Sodium: 135 mmol/L (ref 135–145)
Total Bilirubin: 0.5 mg/dL (ref 0.3–1.2)
Total Protein: 5.5 g/dL — ABNORMAL LOW (ref 6.5–8.1)

## 2021-11-13 LAB — BRAIN NATRIURETIC PEPTIDE: B Natriuretic Peptide: 571 pg/mL — ABNORMAL HIGH (ref 0.0–100.0)

## 2021-11-13 LAB — MAGNESIUM: Magnesium: 1.7 mg/dL (ref 1.7–2.4)

## 2021-11-13 LAB — PROTIME-INR
INR: 2.4 — ABNORMAL HIGH (ref 0.8–1.2)
Prothrombin Time: 25.9 seconds — ABNORMAL HIGH (ref 11.4–15.2)

## 2021-11-13 LAB — PROCALCITONIN: Procalcitonin: 0.27 ng/mL

## 2021-11-13 LAB — C-REACTIVE PROTEIN: CRP: 2.9 mg/dL — ABNORMAL HIGH (ref <1.0)

## 2021-11-13 MED ORDER — HYDRALAZINE HCL 50 MG PO TABS
50.0000 mg | ORAL_TABLET | Freq: Three times a day (TID) | ORAL | Status: DC
  Administered 2021-11-13 (×2): 50 mg via ORAL
  Filled 2021-11-13 (×3): qty 1

## 2021-11-13 MED ORDER — ALPRAZOLAM 0.25 MG PO TABS: 0.2500 mg | ORAL_TABLET | ORAL | 0 refills | Status: DC

## 2021-11-13 MED ORDER — HYDRALAZINE HCL 50 MG PO TABS: 50.0000 mg | ORAL_TABLET | Freq: Three times a day (TID) | ORAL | Status: DC

## 2021-11-13 MED ORDER — AMOXICILLIN-POT CLAVULANATE 500-125 MG PO TABS: 1.0000 | ORAL_TABLET | Freq: Two times a day (BID) | ORAL | 0 refills | Status: DC

## 2021-11-13 MED ORDER — WARFARIN SODIUM 5 MG PO TABS
5.0000 mg | ORAL_TABLET | Freq: Once | ORAL | Status: AC
  Administered 2021-11-13: 5 mg via ORAL
  Filled 2021-11-13: qty 1

## 2021-11-13 MED ORDER — ROSUVASTATIN CALCIUM 40 MG PO TABS: 40.0000 mg | ORAL_TABLET | Freq: Every day | ORAL | Status: DC

## 2021-11-13 NOTE — Progress Notes (Signed)
Patient went off the wall when asked if he was having any shortness of breath as I was doing my assessment. Informed the patient, I was just asking him as this was my first encounter with this patient. This patient started telling me of his other encounters with different floor and different staffs. I told him that I don't have to know what transpired and he mentioned about having the administration people will be coming to talk to him. Patient noted to be very anxious. Checked medications if any was due.

## 2021-11-13 NOTE — Progress Notes (Signed)
Physical Therapy Treatment Patient Details Name: Robert [name] MRN: [id number] DOB: [birth year] Today's Date: 11/13/2021   History of Present Illness 61 y/o male presented to ED on 11/08/21 for R lower quadrant pain. CT abdomen negative. CXR negative. Found to have UTI. Tested positive for COVID-19. Recently admitted 1/14-1/19 for HTN urgency and d/c'ed to SNF. PMH includes IDDM, CKD s/p renal transplant, HTN, CAD s/p CABG 40/9811, embolic CVA    PT Comments    The patient showed good understanding of sternal precautions once reminded and was able to perform supine<>sit with min A for trunk control. His sitting balance shows improvement and he was able to [employer] shorts with min guard for sitting balance while using a reacher. Pt shows continued improvement in overall transfer, sit<>stand, and gait ability. He still requires min cuing for sequencing and remains visually reliant for foot placement. Plan to d/c to SNF. PT to follow up acutely as able.     Recommendations for follow up therapy are one component of a multi-disciplinary discharge planning process, led by the attending physician.  Recommendations may be updated based on patient status, additional functional criteria and insurance authorization.  Follow Up Recommendations  Skilled nursing-short term rehab (<3 hours/day)     Assistance Recommended at Discharge Frequent or constant Supervision/Assistance  Patient can return home with the following A little help with walking and/or transfers;A little help with bathing/dressing/bathroom;Assist for transportation   Equipment Recommendations       Recommendations for Other Services       Precautions / Restrictions       Mobility  Bed Mobility Overal bed mobility: Needs Assistance Bed Mobility: Rolling, Sidelying to Sit Rolling: Min guard Sidelying to sit: HOB elevated Supine to sit: Min assist, HOB elevated Sit to supine: Min assist, HOB elevated Sit to sidelying: Min  assist, HOB elevated General bed mobility comments: Min A for truck support during transfer and cuing to maintain sternal precautions.    Transfers   Equipment used: Rolling walker (2 wheels) Transfers: Sit to/from Stand Sit to Stand: Min guard           General transfer comment: Min guard for STS, cuing for proper hand placement during transfer to maintatin sternal precautions. STS x2 during session.    Ambulation/Gait Ambulation/Gait assistance: Min guard Gait Distance (Feet): 60 Feet (walk aroud bed, then additional 60 ft in one bout) Assistive device: Rolling walker (2 wheels) Gait Pattern/deviations: Step-to pattern, Decreased stride length, Trunk flexed, Narrow base of support Gait velocity: decr     General Gait Details: Pt. was visually reliant for foot placement, cuing to increase BOS for improved stability, for exagerate R LE movement, and to look forward between steps.   Stairs             Wheelchair Mobility    Modified Rankin (Stroke Patients Only)       Balance Overall balance assessment: Needs assistance Sitting-balance support: No upper extremity supported, Feet supported Sitting balance-Leahy Scale: Fair Sitting balance - Comments: EOB to put on shorts   Standing balance support: Bilateral upper extremity supported, Reliant on assistive device for balance, During functional activity Standing balance-Leahy Scale: Poor Standing balance comment: reliant on RW                            Cognition Arousal/Alertness: Awake/alert Behavior During Therapy: WFL for tasks assessed/performed Overall Cognitive Status: Within Functional Limits for tasks assessed  General Comments: appears WFL. Pt. vocal with all concerns and very communicative.        Exercises      General Comments General comments (skin integrity, edema, etc.): No symptomatic complaints during transfers and ambulation       Pertinent Vitals/Pain      Home Living                          Prior Function            PT Goals (current goals can now be found in the care plan section) Acute Rehab PT Goals Patient Stated Goal: to return home once independent PT Goal Formulation: With patient Time For Goal Achievement: 11/24/21 Potential to Achieve Goals: Good Progress towards PT goals: Progressing toward goals    Frequency    Min 2X/week      PT Plan Current plan remains appropriate    Co-evaluation              AM-PAC PT "6 Clicks" Mobility   Outcome Measure  Help needed turning from your back to your side while in a flat bed without using bedrails?: A Little Help needed moving from lying on your back to sitting on the side of a flat bed without using bedrails?: A Little Help needed moving to and from a bed to a chair (including a wheelchair)?: A Lot Help needed standing up from a chair using your arms (e.g., wheelchair or bedside chair)?: A Little Help needed to walk in hospital room?: A Little Help needed climbing 3-5 steps with a railing? : Total 6 Click Score: 15    End of Session Equipment Utilized During Treatment: Gait belt Activity Tolerance: Patient tolerated treatment well Patient left: in chair;with call bell/phone within reach;with chair alarm set Nurse Communication: Mobility status PT Visit Diagnosis: Other abnormalities of gait and mobility (R26.89);Dizziness and giddiness (R42)     Time: 1610-9604 PT Time Calculation (min) (ACUTE ONLY): 41 min  Charges:  $Gait Training: 8-22 mins $Therapeutic Activity: 8-22 mins                     Thermon Leyland, SPT    Thermon Leyland 11/13/2021, 2:09 PM

## 2021-11-13 NOTE — Plan of Care (Signed)
°  Problem: Education: Goal: Knowledge of General Education information will improve Description: Including pain rating scale, medication(s)/side effects and non-pharmacologic comfort measures 11/13/2021 1138 by Dolores Hoose, RN Outcome: Adequate for Discharge 11/13/2021 0818 by Dolores Hoose, RN Outcome: Progressing   Problem: Health Behavior/Discharge Planning: Goal: Ability to manage health-related needs will improve Outcome: Adequate for Discharge   Problem: Clinical Measurements: Goal: Ability to maintain clinical measurements within normal limits will improve 11/13/2021 1138 by Dolores Hoose, RN Outcome: Adequate for Discharge 11/13/2021 0818 by Dolores Hoose, RN Outcome: Progressing Goal: Will remain free from infection Outcome: Adequate for Discharge Goal: Diagnostic test results will improve Outcome: Adequate for Discharge Goal: Respiratory complications will improve 11/13/2021 1138 by Dolores Hoose, RN Outcome: Adequate for Discharge 11/13/2021 0818 by Dolores Hoose, RN Outcome: Progressing Goal: Cardiovascular complication will be avoided Outcome: Adequate for Discharge   Problem: Activity: Goal: Risk for activity intolerance will decrease 11/13/2021 1138 by Dolores Hoose, RN Outcome: Adequate for Discharge 11/13/2021 0818 by Dolores Hoose, RN Outcome: Progressing   Problem: Nutrition: Goal: Adequate nutrition will be maintained Outcome: Adequate for Discharge   Problem: Coping: Goal: Level of anxiety will decrease 11/13/2021 1138 by Dolores Hoose, RN Outcome: Adequate for Discharge 11/13/2021 0818 by Dolores Hoose, RN Outcome: Progressing   Problem: Elimination: Goal: Will not experience complications related to bowel motility Outcome: Adequate for Discharge Goal: Will not experience complications related to urinary retention Outcome: Adequate for Discharge   Problem: Pain Managment: Goal: General experience of comfort will  improve Outcome: Adequate for Discharge   Problem: Safety: Goal: Ability to remain free from injury will improve Outcome: Adequate for Discharge   Problem: Skin Integrity: Goal: Risk for impaired skin integrity will decrease Outcome: Adequate for Discharge   Problem: Acute Rehab PT Goals(only PT should resolve) Goal: Pt Will Go Supine/Side To Sit Outcome: Adequate for Discharge Goal: Patient Will Transfer Sit To/From Stand Outcome: Adequate for Discharge Goal: Pt Will Transfer Bed To Chair/Chair To Bed Outcome: Adequate for Discharge Goal: Pt Will Ambulate Outcome: Adequate for Discharge Goal: Pt/caregiver will Perform Home Exercise Program Outcome: Adequate for Discharge   Problem: Acute Rehab OT Goals (only OT should resolve) Goal: Pt. Will Perform Grooming Outcome: Adequate for Discharge Goal: Pt. Will Perform Upper Body Bathing Outcome: Adequate for Discharge Goal: Pt. Will Perform Lower Body Bathing Outcome: Adequate for Discharge Goal: Pt. Will Transfer To Toilet Outcome: Adequate for Discharge Goal: Pt. Will Perform Toileting-Clothing Manipulation Outcome: Adequate for Discharge   Problem: Acute Rehab OT Goals (only OT should resolve) Goal: Pt. Will Perform Upper Body Dressing Outcome: Adequate for Discharge

## 2021-11-13 NOTE — Discharge Instructions (Addendum)
Follow with Primary MD [name], [name], MD in 7 days   Get CBC, CMP, INR-  checked next visit within 2 days by SNF MD   Activity: As tolerated with Full fall precautions use walker/cane & assistance as needed  Disposition SNF  Diet: Renal low carbohydrate diet with 1.5 L fluid restriction.  Of note patient is on Coumadin.  Check CBGs q. ACH S at SNF.  Check your Weight same time everyday, if you gain over 2 pounds, or you develop in leg swelling, experience more shortness of breath or chest pain, call your Primary MD immediately. Follow Cardiac Low Salt Diet and 1.5 lit/day fluid restriction.  Special Instructions: If you have smoked or chewed Tobacco  in the last 2 yrs please stop smoking, stop any regular Alcohol  and or any Recreational drug use.  On your next visit with your primary care physician please Get Medicines reviewed and adjusted.  Please request your Prim.MD to go over all Hospital Tests and Procedure/Radiological results at the follow up, please get all Hospital records sent to your Prim MD by signing hospital release before you go home.  If you experience worsening of your admission symptoms, develop shortness of breath, life threatening emergency, suicidal or homicidal thoughts you must seek medical attention immediately by calling 911 or calling your MD immediately  if symptoms less severe.  You Must read complete instructions/literature along with all the possible adverse reactions/side effects for all the Medicines you take and that have been prescribed to you. Take any new Medicines after you have completely understood and accpet all the possible adverse reactions/side effects.

## 2021-11-13 NOTE — Progress Notes (Signed)
Patient has home BIPAP and places himself on without assistance. RT will monitor as needed.

## 2021-11-13 NOTE — Plan of Care (Signed)
  Problem: Education: Goal: Knowledge of General Education information will improve Description: Including pain rating scale, medication(s)/side effects and non-pharmacologic comfort measures Outcome: Progressing   Problem: Clinical Measurements: Goal: Ability to maintain clinical measurements within normal limits will improve Outcome: Progressing Goal: Respiratory complications will improve Outcome: Progressing   Problem: Activity: Goal: Risk for activity intolerance will decrease Outcome: Progressing   Problem: Coping: Goal: Level of anxiety will decrease Outcome: Progressing   

## 2021-11-13 NOTE — Progress Notes (Signed)
Inpatient Diabetes Program Recommendations  AACE/ADA: New Consensus Statement on Inpatient Glycemic Control (2015)  Target Ranges:  Prepandial:   less than 140 mg/dL      Peak postprandial:   less than 180 mg/dL (1-2 hours)      Critically ill patients:  140 - 180 mg/dL   Lab Results  Component Value Date   GLUCAP 190 (H) 11/13/2021   HGBA1C 7.1 (H) 10/03/2021    Review of Glycemic Control  Latest Reference Range & Units 11/12/21 07:39 11/12/21 11:37 11/12/21 16:56 11/12/21 22:18 11/13/21 07:37  Glucose-Capillary 70 - 99 mg/dL 156 (H) 193 (H) 220 (H) 227 (H) 190 (H)   Diabetes history: DM 2 Outpatient Diabetes medications: Lantus 10 units daily, Novolog 0-9 units tid  Current orders for Inpatient glycemic control:  Novolog moderate tid with meals and HS Novolog 4 units tid with meals Semglee 15 units daily  Inpatient Diabetes Program Recommendations:    Please consider increasing Semglee to 20 units daily.   Thanks,  Adah Perl, RN, BC-ADM Inpatient Diabetes Coordinator Pager [phone]  (8a-5p)

## 2021-11-13 NOTE — Discharge Summary (Addendum)
Robert [name]:[id number] DOB: Sep 02, 1961 DOA: 11/08/2021  PCP: Haywood Pao, MD  Admit date: 11/08/2021  Discharge date: 11/14/2021  Admitted From: SNF   Disposition:  SNF   Recommendations for Outpatient Follow-up:   Follow up with PCP in 1-2 weeks  PCP Please obtain BMP/CBC, 2 view CXR in 1week,  (see Discharge instructions)   PCP Please follow up on the following pending results: Monitor INR closely along with CBC and CMP   Home Health: None Equipment/Devices: None  Consultations: Nephrology, ID -phone Discharge Condition: Stable    CODE STATUS: Full    Diet Recommendation: Renal-low carbohydrate diet with 1.5 L fluid restriction per day.  Check CBGs q. ACH S.   No chief complaint on file.    Brief history of present illness from the day of admission and additional interim summary    61 year old male with a history of renal transplant at [facility], severe obstructive sleep apnea, status post AVR in December 2022, status post CABG in December 2022, hypertension, type 2 diabetes on insulin, recent stroke requiring systemic anticoagulation with Coumadin due to concern for thrombus, presents to the ER from the nursing home today.  Patient started having severe right lower quadrant pain, he was admitted for pyelonephritis and a transplant kidney.   Patient [name] 11/13/21 - await Bed.                                                                 Hospital Course   Acute pyelonephritis with right lower quadrant abdominal pain at the site of transplant kidney.  In a patient with immunocompromise, due to ESBL Klebsiella infection in the urine.  Blood cultures were negative.  He initially responded very well to Zosyn there after few days of imipenem, 3 his symptoms have completely resolved.  He has so far received  5 days of appropriate IV antibiotic treatment with defervescent's office infection clinically.  Will be switched to Augmentin and this was discussed with ID Dr. Graylon Good in detail.  Continue Augmentin for another 9 days and then stop.  He must follow-up with his nephrologist within 7 days of discharge.  Please monitor CMP closely at SNF.      2.  History of kidney transplant at [facility].  For now home dose prednisone should suffice, does not appear toxic blood pressure stable, was seen by nephrology his transplant medications were continued and he was hydrated.  Renal function is close to his recent base.   3.  AKI on CKD 3B.  Due to acute infection along with dehydration, also has hyponatremia.  Baseline creatinine around 2.5.  He has quickly hydrated and renal function now at baseline, we had held his HCTZ and ACE inhibitor, his urine looks much lighter in color on 11/11/2021, will function close to baseline  we will resume HCTZ, have replaced ACE inhibitor with hydralazine.  Once he follows up with his primary nephrologist and renal function remained stable he can be switched back to ACE inhibitor at that time by his primary nephrologist  4.  Hx of CABG and AVR in December 2022 by Dr. Caffie Pinto.  He is stable continue combination of high intensity statin and beta-blocker for secondary prevention also on Coumadin.   5.  HX of CVA.  Thought to be embolic.  On Coumadin with therapeutic INR of 2.4 today.  Monitor closely at SNF.   6.  Dyslipidemia.  On high intensity statin.   7.  Hypothyroidism.  Continue Synthroid.   8. Obesity with OSA.  BMI of 37, follow with PCP for weight loss.  Nighttime BiPAP.   9.  Recent history of COVID-19 infection.  Around 2 weeks ago. CT here is 28, continue isolation while here.   10.HTN - on combination of Coreg, Norvasc continuing to hold ACE inhibitor due to recent AKI, low-dose hydralazine added in place due to AKI.  Monitor at SNF.   11. DM type II.  On insulin continue  home regimen.  Check CBGs q. [place].   Discharge diagnosis     Principal Problem:   Acute cystitis without hematuria Active Problems:   Severe obstructive sleep apnea   Hypothyroidism   Stage 3b chronic kidney disease (CKD) (HCC)   Type 2 diabetes mellitus with hyperglycemia, with long-term current use of insulin (HCC)   Morbid obesity (HCC)   Kidney transplant status   S/P AVR (aortic valve replacement)   Cerebrovascular accident (CVA) due to embolism of precerebral artery (HCC)   Anemia in chronic kidney disease   Benign essential HTN   Anticoagulated on Coumadin   Hyponatremia    Discharge instructions    Discharge Instructions     Discharge instructions   Complete by: As directed    Follow with Primary MD [name], [name], MD in 7 days   Get CBC, CMP, INR-  checked next visit within 2 days by SNF MD   Activity: As tolerated with Full fall precautions use walker/cane & assistance as needed  Disposition SNF  Diet: Renal low carbohydrate diet with 1.5 L fluid restriction.  Of note patient is on Coumadin.  Check CBGs q. ACHS at SNF.  Check your Weight same time everyday, if you gain over 2 pounds, or you develop in leg swelling, experience more shortness of breath or chest pain, call your Primary MD immediately. Follow Cardiac Low Salt Diet and 1.5 lit/day fluid restriction.  Special Instructions: If you have smoked or chewed Tobacco  in the last 2 yrs please stop smoking, stop any regular Alcohol  and or any Recreational drug use.  On your next visit with your primary care physician please Get Medicines reviewed and adjusted.  Please request your Prim.MD to go over all Hospital Tests and Procedure/Radiological results at the follow up, please get all Hospital records sent to your Prim MD by signing hospital release before you go home.  If you experience worsening of your admission symptoms, develop shortness of breath, life threatening emergency, suicidal or  homicidal thoughts you must seek medical attention immediately by calling 911 or calling your MD immediately  if symptoms less severe.  You Must read complete instructions/literature along with all the possible adverse reactions/side effects for all the Medicines you take and that have been prescribed to you. Take any new Medicines after you have completely understood  and accpet all the possible adverse reactions/side effects.   Increase activity slowly   Complete by: As directed        Discharge Medications   Allergies as of 11/14/2021   No Known Allergies      Medication List     STOP taking these medications    atorvastatin 80 MG tablet Commonly known as: LIPITOR   lisinopril 40 MG tablet Commonly known as: ZESTRIL       TAKE these medications    acetaminophen 325 MG tablet Commonly known as: TYLENOL Take 2 tablets (650 mg total) by mouth every 4 (four) hours as needed for moderate pain.   ALPRAZolam 0.25 MG tablet Commonly known as: XANAX Take 1 tablet (0.25 mg total) by mouth See admin instructions. Every 8 hours as needed for anxiety x 2 weeks   amiodarone 200 MG tablet Commonly known as: PACERONE Take 1 tablet (200 mg total) by mouth 2 (two) times daily.   amLODipine 10 MG tablet Commonly known as: NORVASC TAKE 1 TABLET BY MOUTH EVERY DAY   amoxicillin-clavulanate 500-125 MG tablet Commonly known as: Augmentin Take 1 tablet (500 mg total) by mouth 2 (two) times daily for 9 days.   carvedilol 25 MG tablet Commonly known as: COREG Take 1 tablet (25 mg total) by mouth 2 (two) times daily with a meal.   cholecalciferol 25 MCG (1000 UNIT) tablet Commonly known as: VITAMIN D3 Take 1,000 Units by mouth in the morning and at bedtime.   cloNIDine 0.1 MG tablet Commonly known as: CATAPRES Take 0.1 mg by mouth every 8 (eight) hours as needed (SBP >160).   doxazosin 2 MG tablet Commonly known as: CARDURA Take 1 tablet (2 mg total) by mouth daily. What  changed: when to take this   DULoxetine 60 MG capsule Commonly known as: CYMBALTA Take 60 mg by mouth daily.   enoxaparin 40 MG/0.4ML injection Commonly known as: LOVENOX Inject 40 mg into the skin once.   gabapentin 100 MG capsule Commonly known as: NEURONTIN Take 100 mg by mouth 2 (two) times daily.   hydrALAZINE 100 MG tablet Commonly known as: APRESOLINE Take 1 tablet (100 mg total) by mouth every 8 (eight) hours.   hydrochlorothiazide 12.5 MG capsule Commonly known as: MICROZIDE Take 12.5 mg by mouth daily.   insulin aspart 100 UNIT/ML injection Commonly known as: novoLOG Inject 0-9 Units into the skin 3 (three) times daily with meals. What changed:  how much to take when to take this additional instructions   insulin glargine 100 unit/mL Sopn Commonly known as: LANTUS Inject 10 Units into the skin daily.   levothyroxine 100 MCG tablet Commonly known as: SYNTHROID TAKE 1 TABLET BY MOUTH EVERY DAY What changed: when to take this   mycophenolate 250 MG capsule Commonly known as: CELLCEPT Take 1,000 mg by mouth every 12 (twelve) hours.   NON FORMULARY CPAP/BIPAP  at bedtime   Nulojix 250 MG Solr injection Generic drug: belatacept Inject 24.5 mLs into the vein every 28 (twenty-eight) days.   predniSONE 5 MG tablet Commonly known as: DELTASONE Take 5 mg by mouth daily with breakfast. Continuously   PROAIR RESPICLICK IN Inhale 2 puffs into the lungs See admin instructions. Tid x 3 days   rosuvastatin 40 MG tablet Commonly known as: CRESTOR Take 1 tablet (40 mg total) by mouth daily.   senna-docusate 8.6-50 MG tablet Commonly known as: Senokot-S Take 1 tablet by mouth at bedtime as needed for mild constipation.   tamsulosin  0.4 MG Caps capsule Commonly known as: FLOMAX Take 0.8 mg by mouth at bedtime.   testosterone cypionate 200 MG/ML injection Commonly known as: DEPOTESTOSTERONE CYPIONATE Inject 0.6 mLs (120 mg total) into the muscle every 7  (seven) days. What changed: when to take this   traZODone 50 MG tablet Commonly known as: DESYREL Take 50 mg by mouth See admin instructions. As needed at bedtime for insomnia x 14 days   warfarin 5 MG tablet Commonly known as: COUMADIN Take 5 mg by mouth daily at 6 PM.   warfarin 2.5 MG tablet Commonly known as: COUMADIN Take 1 tablet (2.5 mg total) by mouth daily at 4 PM.         Follow-up Information     [name], [name], MD. Schedule an appointment as soon as possible for a visit in 1 week(s).   Specialty: Internal Medicine Why: Along with your nephrologist and cardiologist within 7 to 10 days of discharge. Contact information: [address] [place] [address] [phone]         Troy Sine, MD .   Specialty: Cardiology Contact information: [address] [place] [address] [phone]                 Major procedures and Radiology Reports - PLEASE review detailed and final reports thoroughly  -      CT ABDOMEN PELVIS WO CONTRAST  Result Date: 11/09/2021 CLINICAL DATA:  Right lower quadrant abdominal pain, renal transplant EXAM: CT ABDOMEN AND PELVIS WITHOUT CONTRAST TECHNIQUE: Multidetector CT imaging of the abdomen and pelvis was performed following the standard protocol without IV contrast. RADIATION DOSE REDUCTION: This exam was performed according to the departmental dose-optimization program which includes automated exposure control, adjustment of the mA and/or kV according to patient size and/or use of iterative reconstruction technique. COMPARISON:  10/09/2021 FINDINGS: Lower chest: Small bilateral pleural effusions. Associated lower lobe atelectasis. This is grossly unchanged. Hepatobiliary: Unenhanced liver is unremarkable. Layering gallstones and/or gallbladder sludge (series 3/image 31), without associated laboratory changes. No intrahepatic or extrahepatic duct dilatation. Pancreas: Within normal limits. Spleen:  Within normal limits. Adrenals/Urinary Tract: Adrenal glands are within normal limits. Status post left nephrectomy. Mild right renal atrophy. No hydronephrosis. Right lower quadrant renal transplant without hydronephrosis. Bladder is mildly thick-walled although underdistended. Stomach/Bowel: Stomach is within normal limits. No evidence of bowel obstruction. Normal appendix (series 3/image 87). Left colonic diverticulosis, without evidence of diverticulitis. Vascular/Lymphatic: No evidence of abdominal aortic aneurysm. Atherosclerotic calcifications of the abdominal aorta and branch vessels. No suspicious abdominopelvic lymphadenopathy. Reproductive: Prostate is unremarkable. Other: No abdominopelvic ascites. Musculoskeletal: Degenerative changes of the visualized thoracolumbar spine, most prominent L1-2. IMPRESSION: Right lower quadrant renal transplant without hydronephrosis. No evidence of bowel obstruction. Normal appendix. Left colonic diverticulosis, without evidence of diverticulitis. Cholelithiasis, without associated inflammatory changes. Small bilateral pleural effusions, unchanged. Electronically Signed   By: Julian Hy M.D.   On: 11/09/2021 01:36   CT HEAD WO CONTRAST (5MM)  Result Date: 10/21/2021 CLINICAL DATA:  Headache, dizziness, weakness EXAM: CT HEAD WITHOUT CONTRAST TECHNIQUE: Contiguous axial images were obtained from the base of the skull through the vertex without intravenous contrast. RADIATION DOSE REDUCTION: This exam was performed according to the departmental dose-optimization program which includes automated exposure control, adjustment of the mA and/or kV according to patient size and/or use of iterative reconstruction technique. COMPARISON:  10/07/2021, 10/11/2021 FINDINGS: Brain: Hypodensities within the left frontal parietal periventricular white matter consistent with areas of acute infarct  seen on previous MRI. No new infarct or hemorrhage. Lateral ventricles and  midline structures are unremarkable. No acute extra-axial fluid collections. No mass effect. Vascular: No hyperdense vessel or unexpected calcification. Stable atherosclerosis. Skull: Normal. Negative for fracture or focal lesion. Sinuses/Orbits: No acute finding. Other: None. IMPRESSION: 1. Subacute small vessel ischemic changes within the left frontal parietal white matter, corresponding to previous MRI findings. 2. No acute infarct or hemorrhage. Electronically Signed   By: Randa Ngo M.D.   On: 10/21/2021 19:11   NM Pulmonary Perfusion  Result Date: 10/24/2021 CLINICAL DATA:  Onset of chest pain this morning, anxiousness, dyspnea, history of CABG 10/05/2021, AVR 10/05/2021, hypertension, type II diabetes mellitus, paroxysmal SVT EXAM: NUCLEAR MEDICINE PERFUSION LUNG SCAN TECHNIQUE: Perfusion images were obtained in multiple projections after intravenous injection of radiopharmaceutical. Ventilation scans intentionally deferred if perfusion scan and chest x-ray adequate for interpretation during COVID 19 epidemic. RADIOPHARMACEUTICALS:  4.1 mCi Tc-19m MAA IV COMPARISON:  Chest radiograph 10/24/2021 FINDINGS: Flattening of posterior lungs consistent with layered pleural effusions. No segmental or subsegmental perfusion defects. IMPRESSION: BILATERAL pleural effusions. Pulmonary embolism absent. Electronically Signed   By: Lavonia Dana M.D.   On: 10/24/2021 13:47   DG Chest Port 1 View  Result Date: 11/09/2021 CLINICAL DATA:  Questionable sepsis. EXAM: PORTABLE CHEST 1 VIEW COMPARISON:  Chest x-ray 10/25/2021. FINDINGS: Sternotomy wires and prosthetic heart valve are again seen. The cardiomediastinal silhouette is enlarged unchanged. There is central pulmonary vascular congestion. There is no lung consolidation, pleural effusion or pneumothorax identified. No acute fractures are seen. IMPRESSION: 1. Cardiomegaly with central pulmonary vascular congestion. Electronically Signed   By: Ronney Asters M.D.    On: 11/09/2021 00:24   DG CHEST PORT 1 VIEW  Result Date: 10/25/2021 CLINICAL DATA:  Shortness of breath, CABG EXAM: PORTABLE CHEST 1 VIEW COMPARISON:  Multiple prior chest radiographs including most recent dated October 24, 2021 FINDINGS: The heart is normal in size. Evidence of prior CABG. Pulmonary vascular congestion with small bilateral pleural effusions, unchanged. No pneumothorax. Osseous structures are unremarkable. IMPRESSION: Mild pulmonary vascular congestion and small bilateral pleural effusions, unchanged. Electronically Signed   By: Keane Police D.O.   On: 10/25/2021 09:10   DG Chest Port 1 View  Result Date: 10/24/2021 CLINICAL DATA:  Bypass surgery. EXAM: PORTABLE CHEST 1 VIEW COMPARISON:  10/21/2021 FINDINGS: The cardiac silhouette, mediastinal and hilar contours are within normal limits and stable. Stable surgical changes bypass surgery. There are persistent bilateral pleural effusions and bibasilar atelectasis but overall slight improved aeration. No pneumothorax. IMPRESSION: Persistent bilateral pleural effusions and bibasilar atelectasis. No edema or pneumothorax. Electronically Signed   By: Marijo Sanes M.D.   On: 10/24/2021 08:03   DG Chest Portable 1 View  Result Date: 10/21/2021 CLINICAL DATA:  61 year old male with history of shortness of breath, dizziness and weakness. EXAM: PORTABLE CHEST 1 VIEW COMPARISON:  Chest x-ray 10/09/2021. FINDINGS: Bibasilar opacities may reflect areas of atelectasis and/or consolidation, with superimposed small bilateral pleural effusions. No pneumothorax. No evidence of pulmonary edema. Heart size is mildly enlarged. Upper mediastinal contours are within normal limits. Status post median sternotomy for aortic valve replacement with what appears to be a stented bioprosthesis. IMPRESSION: 1. Bibasilar areas of atelectasis and/or consolidation with superimposed small bilateral pleural effusions. Electronically Signed   By: Vinnie Langton M.D.   On:  10/21/2021 12:09   ECHOCARDIOGRAM LIMITED  Result Date: 10/24/2021    ECHOCARDIOGRAM LIMITED REPORT   Patient Name:   Robert [name]  Prout Date of Exam: 10/24/2021 Medical Rec #:  [id number]        Height:       71.0 in Accession #:    [id number]       Weight:       270.0 lb Date of Birth:  1961-03-16        BSA:          2.395 m Patient Age:    61 years         BP:           159/80 mmHg Patient Gender: M                HR:           76 bpm. Exam Location:  Inpatient Procedure: Limited Echo, Cardiac Doppler and Color Doppler Indications:    Dyspnea  History:        Patient has prior history of Echocardiogram examinations, most                 recent 05/02/2021. Risk Factors:Diabetes and Hypertension.                 Aortic Valve: 27 mm Edwards INSPIRIS RESILIA pericardial valve                 valve is present in the aortic position. Procedure Date:                 10/05/2021.  Sonographer:    Glo Herring Referring Phys: [id number] [place]  1. Left ventricular ejection fraction, by estimation, is 60 to 65%. The left ventricle has normal function. The left ventricle has no regional wall motion abnormalities. There is moderate concentric left ventricular hypertrophy.  2. Right ventricular systolic function is normal. The right ventricular size is normal.  3. The mitral valve is degenerative.  4. Normal 27 mm Edwards Inspiris Resilia aortic prosthesis. Vmax 2.3 m/s, MG 12 mmHG, EOA 2.14 cm2, DI 0.26. The aortic valve has been repaired/replaced. Aortic valve regurgitation is not visualized. There is a 27 mm Edwards INSPIRIS RESILIA pericardial  valve valve present in the aortic position. Procedure Date: 10/05/2021.  5. The inferior vena cava is dilated in size with >50% respiratory variability, suggesting right atrial pressure of 8 mmHg. Comparison(s): Changes from prior study are noted. AoV now replaced. FINDINGS  Left Ventricle: Left ventricular ejection fraction, by estimation, is 60 to 65%. The  left ventricle has normal function. The left ventricle has no regional wall motion abnormalities. The left ventricular internal cavity size was normal in size. There is  moderate concentric left ventricular hypertrophy. Abnormal (paradoxical) septal motion consistent with post-operative status. Right Ventricle: The right ventricular size is normal. No increase in right ventricular wall thickness. Right ventricular systolic function is normal. Left Atrium: Left atrial size was normal in size. Right Atrium: Right atrial size was normal in size. Pericardium: Trivial pericardial effusion is present. Mitral Valve: The mitral valve is degenerative in appearance. Mild to moderate mitral annular calcification. Tricuspid Valve: The tricuspid valve is grossly normal. Tricuspid valve regurgitation is trivial. No evidence of tricuspid stenosis. Aortic Valve: Normal 27 mm Edwards Inspiris Resilia aortic prosthesis. Vmax 2.3 m/s, MG 12 mmHG, EOA 2.14 cm2, DI 0.26. The aortic valve has been repaired/replaced. Aortic valve regurgitation is not visualized. Aortic valve mean gradient measures 12.0 mmHg. Aortic valve peak gradient measures 21.7 mmHg. Aortic valve area, by VTI measures 2.14 cm. There is a 27 mm  Edwards INSPIRIS RESILIA pericardial valve valve present in the aortic position. Procedure Date: 10/05/2021. Venous: The inferior vena cava is dilated in size with greater than 50% respiratory variability, suggesting right atrial pressure of 8 mmHg. LEFT VENTRICLE PLAX 2D LVIDd:         4.70 cm   Diastology LVIDs:         3.30 cm   LV e' medial:    7.13 cm/s LV PW:         1.25 cm   LV E/e' medial:  17.5 LV IVS:        1.49 cm   LV e' lateral:   6.45 cm/s LVOT diam:     2.10 cm   LV E/e' lateral: 19.4 LV SV:         92 LV SV Index:   38 LVOT Area:     3.46 cm  IVC IVC diam: 2.30 cm LEFT ATRIUM         Index LA diam:    5.30 cm 2.21 cm/m  AORTIC VALVE AV Area (Vmax):    2.14 cm AV Area (Vmean):   2.30 cm AV Area (VTI):      2.14 cm AV Vmax:           233.00 cm/s AV Vmean:          161.000 cm/s AV VTI:            0.428 m AV Peak Grad:      21.7 mmHg AV Mean Grad:      12.0 mmHg LVOT Vmax:         144.00 cm/s LVOT Vmean:        107.000 cm/s LVOT VTI:          0.265 m LVOT/AV VTI ratio: 0.62 MITRAL VALVE MV Area (PHT): 2.61 cm     SHUNTS MV Decel Time: 291 msec     Systemic VTI:  0.26 m MV E velocity: 125.00 cm/s  Systemic Diam: 2.10 cm MV A velocity: 112.00 cm/s MV E/A ratio:  1.12 Eleonore Chiquito MD Electronically signed by Eleonore Chiquito MD Signature Date/Time: 10/24/2021/12:42:32 PM    Final       Today   Subjective    [name] today has no headache,no chest abdominal pain,no new weakness tingling or numbness, feels much better   Objective   Blood pressure (!) 151/71, pulse 61, temperature (!) 97.4 F (36.3 C), temperature source Oral, resp. rate 18, height 5\' 11"  (1.803 m), weight 125.5 kg, SpO2 95 %.   Intake/Output Summary (Last 24 hours) at 11/14/2021 0725 Last data filed at 11/14/2021 0400 Gross per 24 hour  Intake 1280 ml  Output 1850 ml  Net -570 ml    Exam  Awake Alert, No new F.N deficits,    [place].AT,PERRAL Supple Neck,   Symmetrical Chest wall movement, Good air movement bilaterally, CTAB RRR,No Gallops,   +ve B.Sounds, Abd Soft, Non tender,  No Cyanosis, Clubbing or edema    Data Review   CBC w Diff:  Lab Results  Component Value Date   WBC 8.1 11/13/2021   HGB 11.5 (L) 11/13/2021   HGB 15.1 08/18/2021   HCT 34.7 (L) 11/13/2021   HCT 45.8 08/18/2021   PLT 247 11/13/2021   PLT 279 08/18/2021   LYMPHOPCT 9 11/13/2021   MONOPCT 7 11/13/2021   EOSPCT 4 11/13/2021   BASOPCT 1 11/13/2021    CMP:  Lab Results  Component Value Date   NA  135 11/13/2021   NA 137 08/18/2021   K 3.7 11/13/2021   CL 105 11/13/2021   CO2 20 (L) 11/13/2021   BUN 28 (H) 11/13/2021   BUN 30 (H) 08/18/2021   CREATININE 2.34 (H) 11/13/2021   GLU 203 07/07/2019   PROT 5.5 (L) 11/13/2021   ALBUMIN  2.8 (L) 11/13/2021   BILITOT 0.5 11/13/2021   ALKPHOS 83 11/13/2021   AST 24 11/13/2021   ALT 40 11/13/2021  .  Lab Results  Component Value Date   HGBA1C 7.1 (H) 10/03/2021    Total Time in preparing paper work, data evaluation and todays exam - 69 minutes  Lala Lund M.D on 11/14/2021 at 7:25 AM  [facility]

## 2021-11-13 NOTE — TOC Progression Note (Addendum)
Transition of Care [facility]) - Initial/Assessment Note    Patient Details  Name: Robert [name] MRN: [id number] Date of Birth: [birth year]  Transition of Care [facility]) CM/SW Contact:    Milinda Antis, [place] Phone Number: 11/13/2021, 9:59 AM  Clinical Narrative:                 CSW returned a call received from patient.  The patient inquired about being accepted to the facility this week as he was "not accepted" last week.  CSW explained to that no determination was made last week.  The facility did not have a bed available last week and admissions does not work over the weekend to review referrals.  The patient reported that he knows an Therapist, sports that works at the facility that informed him that he could be accepted.  CSW encouraged the patient to speak with the facility directly in reference to this.    CSW requested permission to send referral out to other agencies.  The patient agreed, but did not want the referral sent to [facility] or [facility].  CSW called [facility] and spoke with Whitney [phone].  CSW was informed that the referral was received and that the facility is reviewing.     13:47-  CSW contacted the facility to inquire about whether they can accept the patient.  There was no answer.  CSW left a VM requesting a returned call.   14:20-  CSW received a call from the patient inquiring about the status of SNF placement and was informed that CSW is still waiting to hear from the facility.    16:15-  CSW attempted to contact Whitney with admissions at [facility] again after not receiving a response in reference to the patient's acceptance or denial to the facility.  There was no answer.        Patient Goals and CMS Choice        Expected Discharge Plan and Services                                                Prior Living Arrangements/Services                       Activities of Daily Living Home Assistive Devices/Equipment: BIPAP,  Eyeglasses ADL Screening (condition at time of admission) Patient's cognitive ability adequate to safely complete daily activities?: Yes Is the patient deaf or have difficulty hearing?: No Does the patient have difficulty seeing, even when wearing glasses/contacts?: No Does the patient have difficulty concentrating, remembering, or making decisions?: Yes Patient able to express need for assistance with ADLs?: Yes Does the patient have difficulty dressing or bathing?: Yes Independently performs ADLs?: No Communication: Independent Dressing (OT): Needs assistance Is this a change from baseline?: Pre-admission baseline Grooming: Independent Is this a change from baseline?: Pre-admission baseline Feeding: Independent Bathing: Needs assistance Is this a change from baseline?: Pre-admission baseline Toileting: Needs assistance Is this a change from baseline?: Pre-admission baseline In/Out Bed: Needs assistance Is this a change from baseline?: Pre-admission baseline Walks in Home: Needs assistance Is this a change from baseline?: Pre-admission baseline Does the patient have difficulty walking or climbing stairs?: Yes Weakness of Legs: Both Weakness of Arms/Hands: None  Permission Sought/Granted  Emotional Assessment              Admission diagnosis:  Acute cystitis without hematuria [N30.00] Urinary tract infection with hematuria, site unspecified [N39.0, R31.9] Patient Active Problem List   Diagnosis Date Noted   Acute cystitis without hematuria 11/09/2021   Anticoagulated on Coumadin 11/09/2021   Hyponatremia 11/09/2021   Cerebrovascular accident (CVA) due to embolism of precerebral artery (HCC)    S/P AVR (aortic valve replacement) 10/05/2021   Exertional dyspnea    Fissure of skin 03/08/2020   Kidney transplant status 07/26/2017   Inguinal hernia without obstruction or gangrene 06/30/2017   Tremor of both hands 06/29/2017   Need for prophylactic  immunotherapy 05/20/2017   Hydrocele, acquired 04/07/2017   Scrotal edema 03/14/2017   H/O diabetic neuropathy 03/01/2017   Immunosuppressed status ([place]) 02/26/2017   Prophylactic antibiotic 02/26/2017   Benign neoplasm of transverse colon    Diverticulosis of colon without hemorrhage    Morbid obesity ([place]) 02/03/2016   Type 2 diabetes mellitus with hyperglycemia, with long-term current use of insulin ([place]) 07/14/2015   HTN (hypertension) 05/11/2015   Chronic renal failure    Anemia in chronic kidney disease 03/30/2015   Long term (current) use of insulin ([place]) 03/30/2015   Benign essential HTN 07/30/2014   Protein-calorie malnutrition, severe ([place]) 06/15/2014   Stage 3b chronic kidney disease (CKD) ([place]) 03/22/2014   Fatigue 01/08/2014   H/O unilateral nephrectomy 09/06/2013   Diabetes mellitus type 2 in obese ([place]) 09/06/2013   SVT (supraventricular tachycardia) ([place]) 09/06/2013   Lower extremity edema 09/06/2013   Severe obstructive sleep apnea 09/06/2013   Hypothyroidism 09/06/2013   Hyperlipidemia with target LDL less than 70 09/06/2013   PCP:  [name], [name], MD Pharmacy:   [id number] [place], [place] - [address] [place] [address] Phone: [phone] Fax: [phone]     Social Determinants of Health (SDOH) Interventions    Readmission Risk Interventions Readmission Risk Prevention Plan 11/10/2021  Transportation Screening Complete  Medication Review Press photographer) Referral to Pharmacy  PCP or Specialist appointment within 3-5 days of discharge Not Complete  PCP/Specialist Appt Not Complete comments Patient not ready for discharge  [place] or [place] Not Complete  HRI or Home Care Consult Pt Refusal Comments possible SNF placement  SW Recovery Care/Counseling Consult Complete  Palliative Care Screening Not Applicable  Skilled Nursing Facility Not Complete  SNF Comments  pending PT/ OT orders  Some recent data might be hidden

## 2021-11-13 NOTE — Progress Notes (Signed)
ANTICOAGULATION/ANTIMICROBIAL CONSULT NOTE - Initial Consult  Pharmacy Consult for Coumadin Indication:  AVR/CVA and cystitis  No Known Allergies  Vital Signs: Temp: 97.4 F (36.3 C) (02/06 0542) Temp Source: Oral (02/06 0542) BP: 165/74 (02/06 0543) Pulse Rate: 68 (02/06 0542)  Labs: Recent Labs    11/11/21 0104 11/12/21 0151 11/13/21 0449  HGB 10.8* 11.0* 11.5*  HCT 32.5* 33.0* 34.7*  PLT 231 279 247  LABPROT 23.5* 27.0* 25.9*  INR 2.1* 2.5* 2.4*  CREATININE 2.90* 2.57* 2.34*     Estimated Creatinine Clearance: 44.2 mL/min (A) (by C-G formula based on SCr of 2.34 mg/dL (H)).   Medical History: Past Medical History:  Diagnosis Date   Anemia    low hgb at present   Aortic valve stenosis    Arthritis    FEET   Blood transfusion without reported diagnosis 1974   kidney removed - L   Cancer ([place])    skin ca right shoulder, plastic dsyplasia, pre-Ca polpys removed on Colonoscopy- 07/2014   Colon polyps 07/23/2014   Tubular adenomas x 5   Constipation    Coronary artery disease    Diabetes mellitus without complication ([place])    Type 2   Difficult intubation    unsure of actual problem but it was during the January 28, 2015 procedure.   Dysrhythmia    Eczema    ESRD (end stage renal disease) ([place])    hemodialysis 06/2014-02/2017, s/p living unrelated kidney transplant 02/26/17   GI bleed 07/26/2017   Headache(784.0)    slight dull h/a due kidney failure   Heart murmur    aortic stenosis (moderate-severe 04/2021)   Hyperlipidemia    Hypertension    SVT h/o , followed by Dr. Claiborne Billings   Hypertensive urgency 10/21/2021   Hypothyroidism    Macular degeneration    Neuropathy    right hand and both feet   PAT (paroxysmal atrial tachycardia) ([place])    Peritoneal dialysis catheter in place [facility]) 02/24/2015   PSVT (paroxysmal supraventricular tachycardia) (HCC)    SBO (small bowel obstruction) ([place]) 05/2015   Shortness of breath    with exertion   Sleep apnea 06/18/2011    split-night sleep study- [facility] BiPAP- 13-16   Thyroid disease    Umbilical hernia    s/p repair 04/28/15    Assessment: 61yo male admitted for IV ABX for cystitis in setting of immunosuppression (h/o kidney transplant), also to continue Coumadin for AVR and h/o stroke with Lovenox bridge; of note pt was reportedly on Lovenox "bridge" at [facility] but per SNF MAR pt rec'd only one dose of Lovenox 40mg ; last dose of Coumadin taken 2/1.  INR up to 2.4 today. Likely discharge tomorrow. Prob can resume home dose.    Goal of Therapy:  INR 2-2.5 Anti-Xa level 0.6-1 units/ml 4hrs after LMWH dose given Monitor platelets by anticoagulation protocol: Yes   Plan:  Coumadin 5mg  PO x1  Daily INR  Alanda Slim, PharmD, [facility] Clinical Pharmacist Please see AMION for all Pharmacists' Contact Phone Numbers 11/13/2021, 8:45 AM

## 2021-11-14 LAB — CULTURE, BLOOD (ROUTINE X 2)
Culture: NO GROWTH
Culture: NO GROWTH
Special Requests: ADEQUATE
Special Requests: ADEQUATE

## 2021-11-14 LAB — GLUCOSE, CAPILLARY
Glucose-Capillary: 166 mg/dL — ABNORMAL HIGH (ref 70–99)
Glucose-Capillary: 390 mg/dL — ABNORMAL HIGH (ref 70–99)
Glucose-Capillary: 276 mg/dL — ABNORMAL HIGH (ref 70–99)
Glucose-Capillary: 172 mg/dL — ABNORMAL HIGH (ref 70–99)

## 2021-11-14 LAB — PROTIME-INR
INR: 2.2 — ABNORMAL HIGH (ref 0.8–1.2)
Prothrombin Time: 24.2 seconds — ABNORMAL HIGH (ref 11.4–15.2)

## 2021-11-14 LAB — PROCALCITONIN: Procalcitonin: 0.28 ng/mL

## 2021-11-14 MED ORDER — HYDRALAZINE HCL 100 MG PO TABS: 100.0000 mg | ORAL_TABLET | Freq: Three times a day (TID) | ORAL | Status: DC

## 2021-11-14 MED ORDER — HYDRALAZINE HCL 50 MG PO TABS
100.0000 mg | ORAL_TABLET | Freq: Three times a day (TID) | ORAL | Status: DC
  Administered 2021-11-14 – 2021-11-16 (×8): 100 mg via ORAL
  Filled 2021-11-14 (×8): qty 2

## 2021-11-14 MED ORDER — WARFARIN SODIUM 5 MG PO TABS
5.0000 mg | ORAL_TABLET | Freq: Once | ORAL | Status: AC
Start: 2021-11-14 — End: 2021-11-14
  Administered 2021-11-14: 5 mg via ORAL
  Filled 2021-11-14: qty 1

## 2021-11-14 NOTE — Progress Notes (Signed)
Set up patients home CPAP at bedside. Patient declined use of distilled H20. Patient states he can place himself on/off as needed.

## 2021-11-14 NOTE — Progress Notes (Signed)
Occupational Therapy Treatment Patient Details Name: Robert [name] MRN: [id number] DOB: Jul 15, 1961 Today's Date: 11/14/2021   History of present illness 61 y/o male presented to ED on 11/08/21 for R lower quadrant pain. CT abdomen negative. CXR negative. Found to have UTI. Tested positive for COVID-19. Recently admitted 1/14-1/19 for HTN urgency and d/c'ed to SNF. PMH includes IDDM, CKD s/p renal transplant, HTN, CAD s/p CABG 33/8250, embolic CVA   OT comments  Pt making progress with functional goals. Session focused on sitting EOB, sit - stand with RW with cuing required for hand placement and sternal precautions, ADL mobility with RW, transfers to chair and LB ADL techniques/safety trg. Pt eager to d/c to post acute rehab setting.    Recommendations for follow up therapy are one component of a multi-disciplinary discharge planning process, led by the attending physician.  Recommendations may be updated based on patient status, additional functional criteria and insurance authorization.    Follow Up Recommendations  Skilled nursing-short term rehab (<3 hours/day)    Assistance Recommended at Discharge Intermittent Supervision/Assistance  Patient can return home with the following  A lot of help with bathing/dressing/bathroom;Assist for transportation;Help with stairs or ramp for entrance;A little help with walking and/or transfers   Equipment Recommendations  BSC/3in1;Wheelchair (measurements OT);Wheelchair cushion (measurements OT)    Recommendations for Other Services      Precautions / Restrictions Precautions Precautions: Fall;Sternal Precaution Booklet Issued: No Restrictions Weight Bearing Restrictions: No Other Position/Activity Restrictions: sternal precautions       Mobility Bed Mobility Overal bed mobility: Needs Assistance Bed Mobility: Rolling, Sidelying to Sit Rolling: Supervision Sidelying to sit: HOB elevated, Min guard       General bed mobility comments:  cuing to maintain sternal precautions.    Transfers Overall transfer level: Needs assistance Equipment used: Rolling walker (2 wheels) Transfers: Sit to/from Stand Sit to Stand: Min assist, Min guard     Step pivot transfers: Min guard     General transfer comment: cuing for proper hand placement during transfer to maintatin sternal precautions.     Balance Overall balance assessment: Needs assistance Sitting-balance support: No upper extremity supported, Feet supported Sitting balance-Leahy Scale: Fair     Standing balance support: Bilateral upper extremity supported, Reliant on assistive device for balance, During functional activity Standing balance-Leahy Scale: Poor                             ADL either performed or assessed with clinical judgement   ADL Overall ADL's : Needs assistance/impaired                                       General ADL Comments: Emphasis on problem solving with A/E, compensatory strategies for LB ADLs, correcting posture with standing ADLs and balance for ADL and ADL mobility safety    Extremity/Trunk Assessment Upper Extremity Assessment Upper Extremity Assessment: Generalized weakness   Lower Extremity Assessment Lower Extremity Assessment: Defer to PT evaluation   Cervical / Trunk Assessment Cervical / Trunk Assessment: Other exceptions Cervical / Trunk Exceptions: sternal incision/precautions    Vision Baseline Vision/History: 1 Wears glasses Ability to See in Adequate Light: 0 Adequate Patient Visual Report: No change from baseline     Perception     Praxis      Cognition Arousal/Alertness: Awake/alert Behavior During Therapy: WFL for tasks assessed/performed Overall  Cognitive Status: Within Functional Limits for tasks assessed                                          Exercises      Shoulder Instructions       General Comments      Pertinent Vitals/ Pain       Pain  Assessment Pain Assessment: No/denies pain Pain Score: 0-No pain Pain Intervention(s): Monitored during session, Repositioned  Home Living                                          Prior Functioning/Environment              Frequency  Min 2X/week        Progress Toward Goals  OT Goals(current goals can now be found in the care plan section)  Progress towards OT goals: Progressing toward goals     Plan Discharge plan remains appropriate;Frequency remains appropriate    Co-evaluation                 AM-PAC OT "6 Clicks" Daily Activity     Outcome Measure   Help from another person eating meals?: None Help from another person taking care of personal grooming?: A Little Help from another person toileting, which includes using toliet, bedpan, or urinal?: A Little Help from another person bathing (including washing, rinsing, drying)?: A Lot Help from another person to put on and taking off regular upper body clothing?: A Little Help from another person to put on and taking off regular lower body clothing?: A Lot 6 Click Score: 17    End of Session Equipment Utilized During Treatment: Gait belt;Rolling walker (2 wheels)  OT Visit Diagnosis: Unsteadiness on feet (R26.81);Muscle weakness (generalized) (M62.81)   Activity Tolerance Patient tolerated treatment well   Patient Left in chair;with call bell/phone within reach   Nurse Communication          Time: 7681-1572 OT Time Calculation (min): 34 min  Charges: OT General Charges $OT Visit: 1 Visit OT Treatments $Self Care/Home Management : 8-22 mins $Therapeutic Activity: 8-22 mins    [name] 11/14/2021, 3:37 PM

## 2021-11-14 NOTE — TOC Progression Note (Addendum)
Transition of Care [facility]) - Initial/Assessment Note    Patient Details  Name: Robert [name] MRN: [id number] Date of Birth: 06-08-1961  Transition of Care [facility]) CM/SW Contact:    Robert [name], [place] Phone Number: 11/14/2021, 11:53 AM  Clinical Narrative:                 [name] is unable to accept the patient due to the insurance being out of network.  CSW informed the patient of the determination and presented other bed offers again.  Patient wants to speak with wife and then inform CSW of bed choice.  Due to the patient having commercial insurance, once a facility is chose, they will need to begin insurance auth.  15:04-  Patient informed CSW that the bed choice is [facility].  CSW contacted the facility and is awaiting a response.    16:00-  CSW received a call from [facility]. The patient's insurance is not in network.    CSW then called [name] to verify that they accept the patient's insurance since they extended a bed offer.  CSW was informed that they do not accept the patient's insurance and resent bed offer.    CSW attempted to contact [name] to verify that they accepted the patient's insurance since they also extended a bed offer.  There was no answer.  CSW is awaiting a response.    Patient informed of barriers.  Patient Goals and CMS Choice        Expected Discharge Plan and Services           Expected Discharge Date: 11/13/21                                    Prior Living Arrangements/Services                       Activities of Daily Living Home Assistive Devices/Equipment: BIPAP, Eyeglasses ADL Screening (condition at time of admission) Patient's cognitive ability adequate to safely complete daily activities?: Yes Is the patient deaf or have difficulty hearing?: No Does the patient have difficulty seeing, even when wearing glasses/contacts?: No Does the patient have difficulty concentrating, remembering, or making  decisions?: Yes Patient able to express need for assistance with ADLs?: Yes Does the patient have difficulty dressing or bathing?: Yes Independently performs ADLs?: No Communication: Independent Dressing (OT): Needs assistance Is this a change from baseline?: Pre-admission baseline Grooming: Independent Is this a change from baseline?: Pre-admission baseline Feeding: Independent Bathing: Needs assistance Is this a change from baseline?: Pre-admission baseline Toileting: Needs assistance Is this a change from baseline?: Pre-admission baseline In/Out Bed: Needs assistance Is this a change from baseline?: Pre-admission baseline Walks in Home: Needs assistance Is this a change from baseline?: Pre-admission baseline Does the patient have difficulty walking or climbing stairs?: Yes Weakness of Legs: Both Weakness of Arms/Hands: None  Permission Sought/Granted                  Emotional Assessment              Admission diagnosis:  Acute cystitis without hematuria [N30.00] Urinary tract infection with hematuria, site unspecified [N39.0, R31.9] Patient Active Problem List   Diagnosis Date Noted   Acute cystitis without hematuria 11/09/2021   Anticoagulated on Coumadin 11/09/2021   Hyponatremia 11/09/2021   Cerebrovascular accident (CVA) due to embolism of precerebral artery ([name])  S/P AVR (aortic valve replacement) 10/05/2021   Exertional dyspnea    Fissure of skin 03/08/2020   Kidney transplant status 07/26/2017   Inguinal hernia without obstruction or gangrene 06/30/2017   Tremor of both hands 06/29/2017   Need for prophylactic immunotherapy 05/20/2017   Hydrocele, acquired 04/07/2017   Scrotal edema 03/14/2017   H/O diabetic neuropathy 03/01/2017   Immunosuppressed status ([place]) 02/26/2017   Prophylactic antibiotic 02/26/2017   Benign neoplasm of transverse colon    Diverticulosis of colon without hemorrhage    Morbid obesity ([name]) 02/03/2016   Type 2 diabetes  mellitus with hyperglycemia, with long-term current use of insulin ([place]) 07/14/2015   HTN (hypertension) 05/11/2015   Chronic renal failure    Anemia in chronic kidney disease 03/30/2015   Long term (current) use of insulin ([place]) 03/30/2015   Benign essential HTN 07/30/2014   Protein-calorie malnutrition, severe ([place]) 06/15/2014   Stage 3b chronic kidney disease (CKD) ([place]) 03/22/2014   Fatigue 01/08/2014   H/O unilateral nephrectomy 09/06/2013   Diabetes mellitus type 2 in obese ([place]) 09/06/2013   SVT (supraventricular tachycardia) ([place]) 09/06/2013   Lower extremity edema 09/06/2013   Severe obstructive sleep apnea 09/06/2013   Hypothyroidism 09/06/2013   Hyperlipidemia with target LDL less than 70 09/06/2013   PCP:  [name], [name], MD Pharmacy:   [id number] [place], [place] - [address] AT [address] [place] [address] Phone: [phone] Fax: [phone]     Social Determinants of Health (SDOH) Interventions    Readmission Risk Interventions Readmission Risk Prevention Plan 11/10/2021  Transportation Screening Complete  Medication Review Press photographer) Referral to Pharmacy  PCP or Specialist appointment within 3-5 days of discharge Not Complete  PCP/Specialist Appt Not Complete comments Patient not ready for discharge  [place] or [place] Not Complete  HRI or Home Care Consult Pt Refusal Comments possible SNF placement  SW Recovery Care/Counseling Consult Complete  Palliative Care Screening Not Applicable  Skilled Nursing Facility Not Complete  SNF Comments pending PT/ OT orders  Some recent data might be hidden

## 2021-11-14 NOTE — Progress Notes (Incomplete)
Inpatient Diabetes Program Recommendations  AACE/ADA: New Consensus Statement on Inpatient Glycemic Control (2015)  Target Ranges:  Prepandial:   less than 140 mg/dL      Peak postprandial:   less than 180 mg/dL (1-2 hours)      Critically ill patients:  140 - 180 mg/dL   Lab Results  Component Value Date   GLUCAP 390 (H) 11/14/2021   HGBA1C 7.1 (H) 10/03/2021    Review of Glycemic Control  Diabetes history: *** Outpatient Diabetes medications: *** Current orders for Inpatient glycemic control: ***  Inpatient Diabetes Program Recommendations:

## 2021-11-14 NOTE — Progress Notes (Signed)
ANTICOAGULATION/ANTIMICROBIAL CONSULT NOTE - Initial Consult  Pharmacy Consult for Coumadin Indication:  AVR/CVA and cystitis  No Known Allergies  Vital Signs: Temp: 97.4 F (36.3 C) (02/07 0609) Temp Source: Oral (02/07 0609) BP: 151/71 (02/07 0609) Pulse Rate: 61 (02/07 0609)  Labs: Recent Labs    11/12/21 0151 11/13/21 0449 11/14/21 0620  HGB 11.0* 11.5*  --   HCT 33.0* 34.7*  --   PLT 279 247  --   LABPROT 27.0* 25.9* 24.2*  INR 2.5* 2.4* 2.2*  CREATININE 2.57* 2.34*  --      Estimated Creatinine Clearance: 44.2 mL/min (A) (by C-G formula based on SCr of 2.34 mg/dL (H)).   Medical History: Past Medical History:  Diagnosis Date   Anemia    low hgb at present   Aortic valve stenosis    Arthritis    FEET   Blood transfusion without reported diagnosis 1974   kidney removed - L   Cancer ([place])    skin ca right shoulder, plastic dsyplasia, pre-Ca polpys removed on Colonoscopy- 07/2014   Colon polyps 07/23/2014   Tubular adenomas x 5   Constipation    Coronary artery disease    Diabetes mellitus without complication ([place])    Type 2   Difficult intubation    unsure of actual problem but it was during the January 28, 2015 procedure.   Dysrhythmia    Eczema    ESRD (end stage renal disease) ([place])    hemodialysis 06/2014-02/2017, s/p living unrelated kidney transplant 02/26/17   GI bleed 07/26/2017   Headache(784.0)    slight dull h/a due kidney failure   Heart murmur    aortic stenosis (moderate-severe 04/2021)   Hyperlipidemia    Hypertension    SVT h/o , followed by Dr. Claiborne Billings   Hypertensive urgency 10/21/2021   Hypothyroidism    Macular degeneration    Neuropathy    right hand and both feet   PAT (paroxysmal atrial tachycardia) ([place])    Peritoneal dialysis catheter in place [facility]) 02/24/2015   PSVT (paroxysmal supraventricular tachycardia) (HCC)    SBO (small bowel obstruction) ([place]) 05/2015   Shortness of breath    with exertion   Sleep apnea 06/18/2011    split-night sleep study- [facility]., BiPAP- 13-16   Thyroid disease    Umbilical hernia    s/p repair 04/28/15    Assessment: 61yo male admitted for IV ABX for cystitis in setting of immunosuppression (h/o kidney transplant), also to continue Coumadin for AVR and h/o stroke with Lovenox bridge; of note pt was reportedly on Lovenox "bridge" at [facility] but per SNF MAR pt rec'd only one dose of Lovenox 40mg ; last dose of Coumadin taken 2/1.  INR 2.2 today. Likely discharge today. Resume home dose.    PTA Coumadin 5mg  qday  Goal of Therapy:  INR 2-2.5 Anti-Xa level 0.6-1 units/ml 4hrs after LMWH dose given Monitor platelets by anticoagulation protocol: Yes   Plan:  Coumadin 5mg  PO x1  Daily INR  Alanda Slim, PharmD, [facility] Clinical Pharmacist Please see AMION for all Pharmacists' Contact Phone Numbers 11/14/2021, 7:47 AM

## 2021-11-14 NOTE — Progress Notes (Signed)
[facility]                                                                                                                                                                         Patient Demographics  Robert [name], is a 61 y.o. male  ZWC:[id number]  MPN:[id number]  DOB - 01-22-1961  Admit date - 11/08/2021  Admitting Physician Kristopher Oppenheim, DO  Outpatient Primary MD for the patient is [name], [name], MD  LOS - 5   No chief complaint on file.       Assessment & Plan    Patient seen briefly today due for discharge soon per Discharge done yesterday by me, Hydralazine dose adjusted for better BP control, await SNF bed, patient feels fine.      Medications  Scheduled Meds:  amiodarone  200 mg Oral BID   amLODipine  10 mg Oral QHS   carvedilol  25 mg Oral BID WC   cholecalciferol  1,000 Units Oral Daily   doxazosin  2 mg Oral BID   DULoxetine  60 mg Oral Daily   famotidine  20 mg Oral Daily   gabapentin  100 mg Oral BID   hydrALAZINE  100 mg Oral Q8H   insulin aspart  0-15 Units Subcutaneous TID WC   insulin aspart  0-5 Units Subcutaneous QHS   insulin aspart  4 Units Subcutaneous TID WC   insulin glargine-yfgn  15 Units Subcutaneous Daily   levothyroxine  100 mcg Oral QAC breakfast   mycophenolate  1,000 mg Oral Q12H   predniSONE  5 mg Oral Q breakfast   rosuvastatin  40 mg Oral Daily   tamsulosin  0.8 mg Oral QHS   warfarin  5 mg Oral ONCE-1600   Warfarin - Pharmacist Dosing Inpatient   Does not apply q1600   Continuous Infusions:  belatacept Stopped (11/10/21 1125)   meropenem (MERREM) IV 1 g (11/14/21 0313)   PRN Meds:.acetaminophen **OR** acetaminophen, albuterol, ALPRAZolam, hydrALAZINE, ondansetron **OR** ondansetron (ZOFRAN) IV, traZODone    Time Spent in minutes   10 minutes   Lala Lund M.D on 11/14/2021 at 8:28 AM  Between 7am to 7pm - Pager - [phone]  After  7pm go to [email or web address] - password [email or web address]  And look for the night coverage person covering for me after hours  [facility] Office  [phone]    Subjective:   Robert [name] today has, No headache, No chest pain, No abdominal pain - No Nausea, No new weakness tingling or numbness, No Cough - SOB.    Objective:   Vitals:   11/13/21 1701 11/13/21 2134 11/14/21 0609 11/14/21 0749  BP: (!) 148/61 (!) 162/81 (!) 151/71 (!) 150/65  Pulse: 63 65 61  62  Resp: 19 18 18 20   Temp: 98.1 F (36.7 C) 97.8 F (36.6 C) (!) 97.4 F (36.3 C) 97.7 F (36.5 C)  TempSrc:  Oral Oral Oral  SpO2: 98% 99% 95% 98%  Weight:      Height:        Wt Readings from Last 3 Encounters:  11/13/21 125.5 kg  10/26/21 128.1 kg  10/18/21 130.5 kg     Intake/Output Summary (Last 24 hours) at 11/14/2021 0828 Last data filed at 11/14/2021 0751 Gross per 24 hour  Intake 1300 ml  Output 2550 ml  Net -1250 ml    Exam Awake Alert, Oriented X 3, No new F.N deficits, Normal affect [place].AT,PERRAL Symmetrical Chest wall movement, Good air movement bilaterally, CTAB RRR  +ve B.Sounds, Abd Soft, Non tender,   No Cyanosis, Clubbing or edema,     Data Review

## 2021-11-15 ENCOUNTER — Ambulatory Visit: Payer: 59 | Admitting: Cardiovascular Disease

## 2021-11-15 LAB — GLUCOSE, CAPILLARY
Glucose-Capillary: 173 mg/dL — ABNORMAL HIGH (ref 70–99)
Glucose-Capillary: 265 mg/dL — ABNORMAL HIGH (ref 70–99)
Glucose-Capillary: 202 mg/dL — ABNORMAL HIGH (ref 70–99)
Glucose-Capillary: 280 mg/dL — ABNORMAL HIGH (ref 70–99)

## 2021-11-15 LAB — PROTIME-INR
INR: 2.2 — ABNORMAL HIGH (ref 0.8–1.2)
Prothrombin Time: 24.4 seconds — ABNORMAL HIGH (ref 11.4–15.2)

## 2021-11-15 MED ORDER — WARFARIN SODIUM 5 MG PO TABS
5.0000 mg | ORAL_TABLET | Freq: Every day | ORAL | Status: DC
  Administered 2021-11-15: 5 mg via ORAL
  Filled 2021-11-15: qty 1

## 2021-11-15 NOTE — Discharge Summary (Signed)
Robert [name] DDU:[id number] DOB: 03/28/1961 DOA: 11/08/2021  PCP: Haywood Pao, MD  Admit date: 11/08/2021  Discharge date: 11/15/2021  Admitted From: SNF   Disposition:  SNF   Recommendations for Outpatient Follow-up:   Follow up with PCP in 1-2 weeks  PCP Please obtain BMP/CBC, 2 view CXR in 1week,  (see Discharge instructions)   PCP Please follow up on the following pending results: Monitor INR closely along with CBC and CMP   Home Health: None Equipment/Devices: None  Consultations: Nephrology, ID -phone Discharge Condition: Stable    CODE STATUS: Full    Diet Recommendation: Renal-low carbohydrate diet with 1.5 L fluid restriction per day.  Check CBGs q. ACH S.   No chief complaint on file.    Brief history of present illness from the day of admission and additional interim summary    61 year old male with a history of renal transplant at [facility], severe obstructive sleep apnea, status post AVR in December 2022, status post CABG in December 2022, hypertension, type 2 diabetes on insulin, recent stroke requiring systemic anticoagulation with Coumadin due to concern for thrombus, presents to the ER from the nursing home today.  Patient started having severe right lower quadrant pain, he was admitted for pyelonephritis and a transplant kidney.   Patient [place] 11/13/21 - awaiting bed, remains physically stable for discharge.                                                                 Hospital Course   Acute pyelonephritis with right lower quadrant abdominal pain at the site of transplant kidney.  In a patient with immunocompromise, due to ESBL Klebsiella infection in the urine.  Blood cultures were negative.  He initially responded very well to Zosyn there after few days of imipenem, 3 his symptoms have  completely resolved.  He has so far received 5 days of appropriate IV antibiotic treatment with defervescent's office infection clinically.  Will be switched to Augmentin and this was discussed with ID Dr. Graylon Good in detail.  Continue Augmentin; stop date 11/21/21.  He must follow-up with his nephrologist within 7 days of discharge.  Please monitor CMP closely at SNF.      2.  History of kidney transplant at [facility].  For now home dose prednisone should suffice, does not appear toxic blood pressure stable, was seen by nephrology his transplant medications were continued and he was hydrated.  Renal function is close to his recent base.   3.  AKI on CKD 3B.  Due to acute infection along with dehydration, also has hyponatremia.  Baseline creatinine around 2.5.  He has quickly hydrated and renal function now at baseline, we had held his HCTZ and ACE inhibitor, his urine looks much lighter in color on 11/11/2021, will function close to  baseline we will resume HCTZ, have replaced ACE inhibitor with hydralazine.  Once he follows up with his primary nephrologist and renal function remained stable he can be switched back to ACE inhibitor at that time by his primary nephrologist  4.  Hx of CABG and AVR in December 2022 by Dr. Caffie Pinto.  He is stable continue combination of high intensity statin and beta-blocker for secondary prevention also on Coumadin.   5.  HX of CVA.  Thought to be embolic.  On Coumadin with therapeutic INR of 2.4 today.  Monitor closely at SNF.   6.  Dyslipidemia.  On high intensity statin.   7.  Hypothyroidism.  Continue Synthroid.   8. Obesity with OSA.  BMI of 37, follow with PCP for weight loss.  Nighttime BiPAP.   9.  Recent history of COVID-19 infection.  Around 2 weeks ago. CT here is 28, continue isolation while here.   10.HTN - on combination of Coreg, Norvasc continuing to hold ACE inhibitor due to recent AKI, low-dose hydralazine added in place due to AKI.  Monitor at SNF.   11. DM  type II.  On insulin continue home regimen.  Check CBGs q. [place].   Discharge diagnosis     Principal Problem:   Acute cystitis without hematuria Active Problems:   Severe obstructive sleep apnea   Hypothyroidism   Stage 3b chronic kidney disease (CKD) (HCC)   Type 2 diabetes mellitus with hyperglycemia, with long-term current use of insulin (HCC)   Morbid obesity (HCC)   Kidney transplant status   S/P AVR (aortic valve replacement)   Cerebrovascular accident (CVA) due to embolism of precerebral artery (HCC)   Anemia in chronic kidney disease   Benign essential HTN   Anticoagulated on Coumadin   Hyponatremia    Discharge instructions    Discharge Instructions     Discharge instructions   Complete by: As directed    Follow with Primary MD [name], [name], MD in 7 days   Get CBC, CMP, INR-  checked next visit within 2 days by SNF MD   Activity: As tolerated with Full fall precautions use walker/cane & assistance as needed  Disposition SNF  Diet: Renal low carbohydrate diet with 1.5 L fluid restriction.  Of note patient is on Coumadin.  Check CBGs q. ACHS at SNF.  Check your Weight same time everyday, if you gain over 2 pounds, or you develop in leg swelling, experience more shortness of breath or chest pain, call your Primary MD immediately. Follow Cardiac Low Salt Diet and 1.5 lit/day fluid restriction.  Special Instructions: If you have smoked or chewed Tobacco  in the last 2 yrs please stop smoking, stop any regular Alcohol  and or any Recreational drug use.  On your next visit with your primary care physician please Get Medicines reviewed and adjusted.  Please request your Prim.MD to go over all Hospital Tests and Procedure/Radiological results at the follow up, please get all Hospital records sent to your Prim MD by signing hospital release before you go home.  If you experience worsening of your admission symptoms, develop shortness of breath, life threatening  emergency, suicidal or homicidal thoughts you must seek medical attention immediately by calling 911 or calling your MD immediately  if symptoms less severe.  You Must read complete instructions/literature along with all the possible adverse reactions/side effects for all the Medicines you take and that have been prescribed to you. Take any new Medicines after you have completely  understood and accpet all the possible adverse reactions/side effects.   Increase activity slowly   Complete by: As directed        Discharge Medications   Allergies as of 11/15/2021   No Known Allergies      Medication List     STOP taking these medications    atorvastatin 80 MG tablet Commonly known as: LIPITOR   lisinopril 40 MG tablet Commonly known as: ZESTRIL       TAKE these medications    acetaminophen 325 MG tablet Commonly known as: TYLENOL Take 2 tablets (650 mg total) by mouth every 4 (four) hours as needed for moderate pain.   ALPRAZolam 0.25 MG tablet Commonly known as: XANAX Take 1 tablet (0.25 mg total) by mouth See admin instructions. Every 8 hours as needed for anxiety x 2 weeks   amiodarone 200 MG tablet Commonly known as: PACERONE Take 1 tablet (200 mg total) by mouth 2 (two) times daily.   amLODipine 10 MG tablet Commonly known as: NORVASC TAKE 1 TABLET BY MOUTH EVERY DAY   amoxicillin-clavulanate 500-125 MG tablet Commonly known as: Augmentin Take 1 tablet (500 mg total) by mouth 2 (two) times daily for 9 days.   carvedilol 25 MG tablet Commonly known as: COREG Take 1 tablet (25 mg total) by mouth 2 (two) times daily with a meal.   cholecalciferol 25 MCG (1000 UNIT) tablet Commonly known as: VITAMIN D3 Take 1,000 Units by mouth in the morning and at bedtime.   cloNIDine 0.1 MG tablet Commonly known as: CATAPRES Take 0.1 mg by mouth every 8 (eight) hours as needed (SBP >160).   doxazosin 2 MG tablet Commonly known as: CARDURA Take 1 tablet (2 mg total) by  mouth daily. What changed: when to take this   DULoxetine 60 MG capsule Commonly known as: CYMBALTA Take 60 mg by mouth daily.   enoxaparin 40 MG/0.4ML injection Commonly known as: LOVENOX Inject 40 mg into the skin once.   gabapentin 100 MG capsule Commonly known as: NEURONTIN Take 100 mg by mouth 2 (two) times daily.   hydrALAZINE 100 MG tablet Commonly known as: APRESOLINE Take 1 tablet (100 mg total) by mouth every 8 (eight) hours.   hydrochlorothiazide 12.5 MG capsule Commonly known as: MICROZIDE Take 12.5 mg by mouth daily.   insulin aspart 100 UNIT/ML injection Commonly known as: novoLOG Inject 0-9 Units into the skin 3 (three) times daily with meals. What changed:  how much to take when to take this additional instructions   insulin glargine 100 unit/mL Sopn Commonly known as: LANTUS Inject 10 Units into the skin daily.   levothyroxine 100 MCG tablet Commonly known as: SYNTHROID TAKE 1 TABLET BY MOUTH EVERY DAY What changed: when to take this   mycophenolate 250 MG capsule Commonly known as: CELLCEPT Take 1,000 mg by mouth every 12 (twelve) hours.   NON FORMULARY CPAP/BIPAP  at bedtime   Nulojix 250 MG Solr injection Generic drug: belatacept Inject 24.5 mLs into the vein every 28 (twenty-eight) days.   predniSONE 5 MG tablet Commonly known as: DELTASONE Take 5 mg by mouth daily with breakfast. Continuously   PROAIR RESPICLICK IN Inhale 2 puffs into the lungs See admin instructions. Tid x 3 days   rosuvastatin 40 MG tablet Commonly known as: CRESTOR Take 1 tablet (40 mg total) by mouth daily.   senna-docusate 8.6-50 MG tablet Commonly known as: Senokot-S Take 1 tablet by mouth at bedtime as needed for mild constipation.  tamsulosin 0.4 MG Caps capsule Commonly known as: FLOMAX Take 0.8 mg by mouth at bedtime.   testosterone cypionate 200 MG/ML injection Commonly known as: DEPOTESTOSTERONE CYPIONATE Inject 0.6 mLs (120 mg total) into the  muscle every 7 (seven) days. What changed: when to take this   traZODone 50 MG tablet Commonly known as: DESYREL Take 50 mg by mouth See admin instructions. As needed at bedtime for insomnia x 14 days   warfarin 5 MG tablet Commonly known as: COUMADIN Take 5 mg by mouth daily at 6 PM.   warfarin 2.5 MG tablet Commonly known as: COUMADIN Take 1 tablet (2.5 mg total) by mouth daily at 4 PM.         Follow-up Information     [name], [name], MD. Schedule an appointment as soon as possible for a visit in 1 week(s).   Specialty: Internal Medicine Why: Along with your nephrologist and cardiologist within 7 to 10 days of discharge. Contact information: [address] [place] [address] [phone]         Troy Sine, MD .   Specialty: Cardiology Contact information: [address] [place]  [address] [phone]                 Major procedures and Radiology Reports - PLEASE review detailed and final reports thoroughly  -      CT ABDOMEN PELVIS WO CONTRAST  Result Date: 11/09/2021 CLINICAL DATA:  Right lower quadrant abdominal pain, renal transplant EXAM: CT ABDOMEN AND PELVIS WITHOUT CONTRAST TECHNIQUE: Multidetector CT imaging of the abdomen and pelvis was performed following the standard protocol without IV contrast. RADIATION DOSE REDUCTION: This exam was performed according to the departmental dose-optimization program which includes automated exposure control, adjustment of the mA and/or kV according to patient size and/or use of iterative reconstruction technique. COMPARISON:  10/09/2021 FINDINGS: Lower chest: Small bilateral pleural effusions. Associated lower lobe atelectasis. This is grossly unchanged. Hepatobiliary: Unenhanced liver is unremarkable. Layering gallstones and/or gallbladder sludge (series 3/image 31), without associated laboratory changes. No intrahepatic or extrahepatic duct dilatation. Pancreas: Within normal  limits. Spleen: Within normal limits. Adrenals/Urinary Tract: Adrenal glands are within normal limits. Status post left nephrectomy. Mild right renal atrophy. No hydronephrosis. Right lower quadrant renal transplant without hydronephrosis. Bladder is mildly thick-walled although underdistended. Stomach/Bowel: Stomach is within normal limits. No evidence of bowel obstruction. Normal appendix (series 3/image 87). Left colonic diverticulosis, without evidence of diverticulitis. Vascular/Lymphatic: No evidence of abdominal aortic aneurysm. Atherosclerotic calcifications of the abdominal aorta and branch vessels. No suspicious abdominopelvic lymphadenopathy. Reproductive: Prostate is unremarkable. Other: No abdominopelvic ascites. Musculoskeletal: Degenerative changes of the visualized thoracolumbar spine, most prominent L1-2. IMPRESSION: Right lower quadrant renal transplant without hydronephrosis. No evidence of bowel obstruction. Normal appendix. Left colonic diverticulosis, without evidence of diverticulitis. Cholelithiasis, without associated inflammatory changes. Small bilateral pleural effusions, unchanged. Electronically Signed   By: Julian Hy M.D.   On: 11/09/2021 01:36   CT HEAD WO CONTRAST (5MM)  Result Date: 10/21/2021 CLINICAL DATA:  Headache, dizziness, weakness EXAM: CT HEAD WITHOUT CONTRAST TECHNIQUE: Contiguous axial images were obtained from the base of the skull through the vertex without intravenous contrast. RADIATION DOSE REDUCTION: This exam was performed according to the departmental dose-optimization program which includes automated exposure control, adjustment of the mA and/or kV according to patient size and/or use of iterative reconstruction technique. COMPARISON:  10/07/2021, 10/11/2021 FINDINGS: Brain: Hypodensities within the left frontal parietal periventricular white matter consistent with areas of acute  infarct seen on previous MRI. No new infarct or hemorrhage. Lateral  ventricles and midline structures are unremarkable. No acute extra-axial fluid collections. No mass effect. Vascular: No hyperdense vessel or unexpected calcification. Stable atherosclerosis. Skull: Normal. Negative for fracture or focal lesion. Sinuses/Orbits: No acute finding. Other: None. IMPRESSION: 1. Subacute small vessel ischemic changes within the left frontal parietal white matter, corresponding to previous MRI findings. 2. No acute infarct or hemorrhage. Electronically Signed   By: Randa Ngo M.D.   On: 10/21/2021 19:11   NM Pulmonary Perfusion  Result Date: 10/24/2021 CLINICAL DATA:  Onset of chest pain this morning, anxiousness, dyspnea, history of CABG 10/05/2021, AVR 10/05/2021, hypertension, type II diabetes mellitus, paroxysmal SVT EXAM: NUCLEAR MEDICINE PERFUSION LUNG SCAN TECHNIQUE: Perfusion images were obtained in multiple projections after intravenous injection of radiopharmaceutical. Ventilation scans intentionally deferred if perfusion scan and chest x-ray adequate for interpretation during COVID 19 epidemic. RADIOPHARMACEUTICALS:  4.1 mCi Tc-81m MAA IV COMPARISON:  Chest radiograph 10/24/2021 FINDINGS: Flattening of posterior lungs consistent with layered pleural effusions. No segmental or subsegmental perfusion defects. IMPRESSION: BILATERAL pleural effusions. Pulmonary embolism absent. Electronically Signed   By: Lavonia Dana M.D.   On: 10/24/2021 13:47   DG Chest Port 1 View  Result Date: 11/09/2021 CLINICAL DATA:  Questionable sepsis. EXAM: PORTABLE CHEST 1 VIEW COMPARISON:  Chest x-ray 10/25/2021. FINDINGS: Sternotomy wires and prosthetic heart valve are again seen. The cardiomediastinal silhouette is enlarged unchanged. There is central pulmonary vascular congestion. There is no lung consolidation, pleural effusion or pneumothorax identified. No acute fractures are seen. IMPRESSION: 1. Cardiomegaly with central pulmonary vascular congestion. Electronically Signed   By: Ronney Asters M.D.   On: 11/09/2021 00:24   DG CHEST PORT 1 VIEW  Result Date: 10/25/2021 CLINICAL DATA:  Shortness of breath, CABG EXAM: PORTABLE CHEST 1 VIEW COMPARISON:  Multiple prior chest radiographs including most recent dated October 24, 2021 FINDINGS: The heart is normal in size. Evidence of prior CABG. Pulmonary vascular congestion with small bilateral pleural effusions, unchanged. No pneumothorax. Osseous structures are unremarkable. IMPRESSION: Mild pulmonary vascular congestion and small bilateral pleural effusions, unchanged. Electronically Signed   By: Keane Police D.O.   On: 10/25/2021 09:10   DG Chest Port 1 View  Result Date: 10/24/2021 CLINICAL DATA:  Bypass surgery. EXAM: PORTABLE CHEST 1 VIEW COMPARISON:  10/21/2021 FINDINGS: The cardiac silhouette, mediastinal and hilar contours are within normal limits and stable. Stable surgical changes bypass surgery. There are persistent bilateral pleural effusions and bibasilar atelectasis but overall slight improved aeration. No pneumothorax. IMPRESSION: Persistent bilateral pleural effusions and bibasilar atelectasis. No edema or pneumothorax. Electronically Signed   By: Marijo Sanes M.D.   On: 10/24/2021 08:03   DG Chest Portable 1 View  Result Date: 10/21/2021 CLINICAL DATA:  61 year old male with history of shortness of breath, dizziness and weakness. EXAM: PORTABLE CHEST 1 VIEW COMPARISON:  Chest x-ray 10/09/2021. FINDINGS: Bibasilar opacities may reflect areas of atelectasis and/or consolidation, with superimposed small bilateral pleural effusions. No pneumothorax. No evidence of pulmonary edema. Heart size is mildly enlarged. Upper mediastinal contours are within normal limits. Status post median sternotomy for aortic valve replacement with what appears to be a stented bioprosthesis. IMPRESSION: 1. Bibasilar areas of atelectasis and/or consolidation with superimposed small bilateral pleural effusions. Electronically Signed   By: Vinnie Langton M.D.   On: 10/21/2021 12:09   ECHOCARDIOGRAM LIMITED  Result Date: 10/24/2021    ECHOCARDIOGRAM LIMITED REPORT   Patient Name:   Robert [name] Date of Exam: 10/24/2021 Medical Rec #:  [id number]        Height:       71.0 in Accession #:    [id number]       Weight:       270.0 lb Date of Birth:  08/07/1961        BSA:          2.395 m Patient Age:    4 years         BP:           159/80 mmHg Patient Gender: M                HR:           76 bpm. Exam Location:  Inpatient Procedure: Limited Echo, Cardiac Doppler and Color Doppler Indications:    Dyspnea  History:        Patient has prior history of Echocardiogram examinations, most                 recent 05/02/2021. Risk Factors:Diabetes and Hypertension.                 Aortic Valve: 27 mm Edwards INSPIRIS RESILIA pericardial valve                 valve is present in the aortic position. Procedure Date:                 10/05/2021.  Sonographer:    Glo Herring Referring Phys: [id number] [place]  1. Left ventricular ejection fraction, by estimation, is 60 to 65%. The left ventricle has normal function. The left ventricle has no regional wall motion abnormalities. There is moderate concentric left ventricular hypertrophy.  2. Right ventricular systolic function is normal. The right ventricular size is normal.  3. The mitral valve is degenerative.  4. Normal 27 mm Edwards Inspiris Resilia aortic prosthesis. Vmax 2.3 m/s, MG 12 mmHG, EOA 2.14 cm2, DI 0.26. The aortic valve has been repaired/replaced. Aortic valve regurgitation is not visualized. There is a 27 mm Edwards INSPIRIS RESILIA pericardial  valve valve present in the aortic position. Procedure Date: 10/05/2021.  5. The inferior vena cava is dilated in size with >50% respiratory variability, suggesting right atrial pressure of 8 mmHg. Comparison(s): Changes from prior study are noted. AoV now replaced. FINDINGS  Left Ventricle: Left ventricular ejection fraction, by estimation,  is 60 to 65%. The left ventricle has normal function. The left ventricle has no regional wall motion abnormalities. The left ventricular internal cavity size was normal in size. There is  moderate concentric left ventricular hypertrophy. Abnormal (paradoxical) septal motion consistent with post-operative status. Right Ventricle: The right ventricular size is normal. No increase in right ventricular wall thickness. Right ventricular systolic function is normal. Left Atrium: Left atrial size was normal in size. Right Atrium: Right atrial size was normal in size. Pericardium: Trivial pericardial effusion is present. Mitral Valve: The mitral valve is degenerative in appearance. Mild to moderate mitral annular calcification. Tricuspid Valve: The tricuspid valve is grossly normal. Tricuspid valve regurgitation is trivial. No evidence of tricuspid stenosis. Aortic Valve: Normal 27 mm Edwards Inspiris Resilia aortic prosthesis. Vmax 2.3 m/s, MG 12 mmHG, EOA 2.14 cm2, DI 0.26. The aortic valve has been repaired/replaced. Aortic valve regurgitation is not visualized. Aortic valve mean gradient measures 12.0 mmHg. Aortic valve peak gradient measures 21.7 mmHg. Aortic valve area, by VTI measures 2.14 cm. There is a 27  mm Edwards INSPIRIS RESILIA pericardial valve valve present in the aortic position. Procedure Date: 10/05/2021. Venous: The inferior vena cava is dilated in size with greater than 50% respiratory variability, suggesting right atrial pressure of 8 mmHg. LEFT VENTRICLE PLAX 2D LVIDd:         4.70 cm   Diastology LVIDs:         3.30 cm   LV e' medial:    7.13 cm/s LV PW:         1.25 cm   LV E/e' medial:  17.5 LV IVS:        1.49 cm   LV e' lateral:   6.45 cm/s LVOT diam:     2.10 cm   LV E/e' lateral: 19.4 LV SV:         92 LV SV Index:   38 LVOT Area:     3.46 cm  IVC IVC diam: 2.30 cm LEFT ATRIUM         Index LA diam:    5.30 cm 2.21 cm/m  AORTIC VALVE AV Area (Vmax):    2.14 cm AV Area (Vmean):   2.30 cm AV  Area (VTI):     2.14 cm AV Vmax:           233.00 cm/s AV Vmean:          161.000 cm/s AV VTI:            0.428 m AV Peak Grad:      21.7 mmHg AV Mean Grad:      12.0 mmHg LVOT Vmax:         144.00 cm/s LVOT Vmean:        107.000 cm/s LVOT VTI:          0.265 m LVOT/AV VTI ratio: 0.62 MITRAL VALVE MV Area (PHT): 2.61 cm     SHUNTS MV Decel Time: 291 msec     Systemic VTI:  0.26 m MV E velocity: 125.00 cm/s  Systemic Diam: 2.10 cm MV A velocity: 112.00 cm/s MV E/A ratio:  1.12 Eleonore Chiquito MD Electronically signed by Eleonore Chiquito MD Signature Date/Time: 10/24/2021/12:42:32 PM    Final       Today   Subjective    Robert [name] today has no headache,no chest abdominal pain,no new weakness tingling or numbness, feels much better   Objective   Blood pressure (!) 140/57, pulse 67, temperature 98 F (36.7 C), temperature source Oral, resp. rate 20, height 5\' 11"  (1.803 m), weight 125.5 kg, SpO2 100 %.   Intake/Output Summary (Last 24 hours) at 11/15/2021 0729 Last data filed at 11/15/2021 0600 Gross per 24 hour  Intake 800 ml  Output 1200 ml  Net -400 ml     Exam  Awake Alert, No new F.N deficits,    .AT,PERRAL Supple Neck,   Symmetrical Chest wall movement, Good air movement bilaterally, CTAB RRR,No Gallops,   +ve B.Sounds, Abd Soft, Non tender,  No Cyanosis, Clubbing or edema    Data Review   CBC w Diff:  Lab Results  Component Value Date   WBC 8.1 11/13/2021   HGB 11.5 (L) 11/13/2021   HGB 15.1 08/18/2021   HCT 34.7 (L) 11/13/2021   HCT 45.8 08/18/2021   PLT 247 11/13/2021   PLT 279 08/18/2021   LYMPHOPCT 9 11/13/2021   MONOPCT 7 11/13/2021   EOSPCT 4 11/13/2021   BASOPCT 1 11/13/2021    CMP:  Lab Results  Component Value Date  NA 135 11/13/2021   NA 137 08/18/2021   K 3.7 11/13/2021   CL 105 11/13/2021   CO2 20 (L) 11/13/2021   BUN 28 (H) 11/13/2021   BUN 30 (H) 08/18/2021   CREATININE 2.34 (H) 11/13/2021   GLU 203 07/07/2019   PROT 5.5 (L) 11/13/2021    ALBUMIN 2.8 (L) 11/13/2021   BILITOT 0.5 11/13/2021   ALKPHOS 83 11/13/2021   AST 24 11/13/2021   ALT 40 11/13/2021  .  Lab Results  Component Value Date   HGBA1C 7.1 (H) 10/03/2021    Total Time in preparing paper work, data evaluation and todays exam - 15 minutes  Little Ishikawa M.D on 11/15/2021 at 7:29 AM  [facility]

## 2021-11-15 NOTE — TOC Progression Note (Addendum)
Transition of Care [facility]) - Initial/Assessment Note    Patient Details  Name: Robert [name] MRN: [id number] Date of Birth: 1961/02/17  Transition of Care [facility]) CM/SW Contact:    Milinda Antis, LCSWA Phone Number: 11/15/2021, 9:10 AM  Clinical Narrative:                 CSW received VM from patient stating that he "researched" and found that the facilities ([place], [place], and [place]) do accept his insurance.  CSW returned the patient's call and explained that they facilities accept [facility] Medicare Complete, but not commercial [facility] which the patient has.  CSW requested that the patient choose a facility today as he has been medically ready for the past 2 days.  The patient requested that CSW call [name] again to ask if they will accept.  CSW contacted Janie with admissions at [name] to inquire about whether the facility can accept the patient's insurance and is waiting a returned call.  CSW received a returned call from [place] with [name] SNF.  The facility is unable to accept the patient.    CSW informed the patient of the above information. The patient then stated that he would be willing to go back to [facility] if he did not meet the criteria for CIR.  CSW informed the MD and PT of the patient's request.  As a backup plan, CSW also contacted Juliann Pulse with admissions at [facility] and inquired about whether the patient can return. The facility is requesting insurance auth for the patient's possible return.    1600: CSW informed that the patient is not a candidate for CIR.  Pending: insurance auth.  16:45-  CSW informed that PT is recommending home with home health.  CSW spoke with the patient and he and his spouse are in agreement.  The patient reports that he does not have an agency preference and that he does not have any DME at home currently.  RNCM notified.         Patient Goals and CMS Choice        Expected Discharge Plan and Services            Expected Discharge Date: 11/13/21                                    Prior Living Arrangements/Services                       Activities of Daily Living Home Assistive Devices/Equipment: BIPAP, Eyeglasses ADL Screening (condition at time of admission) Patient's cognitive ability adequate to safely complete daily activities?: Yes Is the patient deaf or have difficulty hearing?: No Does the patient have difficulty seeing, even when wearing glasses/contacts?: No Does the patient have difficulty concentrating, remembering, or making decisions?: Yes Patient able to express need for assistance with ADLs?: Yes Does the patient have difficulty dressing or bathing?: Yes Independently performs ADLs?: No Communication: Independent Dressing (OT): Needs assistance Is this a change from baseline?: Pre-admission baseline Grooming: Independent Is this a change from baseline?: Pre-admission baseline Feeding: Independent Bathing: Needs assistance Is this a change from baseline?: Pre-admission baseline Toileting: Needs assistance Is this a change from baseline?: Pre-admission baseline In/Out Bed: Needs assistance Is this a change from baseline?: Pre-admission baseline Walks in Home: Needs assistance Is this a change from baseline?: Pre-admission baseline Does the patient have difficulty walking or climbing stairs?:  Yes Weakness of Legs: Both Weakness of Arms/Hands: None  Permission Sought/Granted                  Emotional Assessment              Admission diagnosis:  Acute cystitis without hematuria [N30.00] Urinary tract infection with hematuria, site unspecified [N39.0, R31.9] Patient Active Problem List   Diagnosis Date Noted   Acute cystitis without hematuria 11/09/2021   Anticoagulated on Coumadin 11/09/2021   Hyponatremia 11/09/2021   Cerebrovascular accident (CVA) due to embolism of precerebral artery (HCC)    S/P AVR (aortic valve replacement)  10/05/2021   Exertional dyspnea    Fissure of skin 03/08/2020   Kidney transplant status 07/26/2017   Inguinal hernia without obstruction or gangrene 06/30/2017   Tremor of both hands 06/29/2017   Need for prophylactic immunotherapy 05/20/2017   Hydrocele, acquired 04/07/2017   Scrotal edema 03/14/2017   H/O diabetic neuropathy 03/01/2017   Immunosuppressed status ([place]) 02/26/2017   Prophylactic antibiotic 02/26/2017   Benign neoplasm of transverse colon    Diverticulosis of colon without hemorrhage    Morbid obesity ([place]) 02/03/2016   Type 2 diabetes mellitus with hyperglycemia, with long-term current use of insulin ([place]) 07/14/2015   HTN (hypertension) 05/11/2015   Chronic renal failure    Anemia in chronic kidney disease 03/30/2015   Long term (current) use of insulin ([place]) 03/30/2015   Benign essential HTN 07/30/2014   Protein-calorie malnutrition, severe ([place]) 06/15/2014   Stage 3b chronic kidney disease (CKD) ([place]) 03/22/2014   Fatigue 01/08/2014   H/O unilateral nephrectomy 09/06/2013   Diabetes mellitus type 2 in obese ([place]) 09/06/2013   SVT (supraventricular tachycardia) ([place]) 09/06/2013   Lower extremity edema 09/06/2013   Severe obstructive sleep apnea 09/06/2013   Hypothyroidism 09/06/2013   Hyperlipidemia with target LDL less than 70 09/06/2013   PCP:  [name], [name], MD Pharmacy:   [id number] - [place], [place] - [address] [place] [address] Phone: [phone] Fax: [phone]     Social Determinants of Health (SDOH) Interventions    Readmission Risk Interventions Readmission Risk Prevention Plan 11/10/2021  Transportation Screening Complete  Medication Review Press photographer) Referral to Pharmacy  PCP or Specialist appointment within 3-5 days of discharge Not Complete  PCP/Specialist Appt Not Complete comments Patient not ready for discharge  [place] or [place] Not  Complete  HRI or Home Care Consult Pt Refusal Comments possible SNF placement  SW Recovery Care/Counseling Consult Complete  Palliative Care Screening Not Applicable  Skilled Nursing Facility Not Complete  SNF Comments pending PT/ OT orders  Some recent data might be hidden

## 2021-11-15 NOTE — Progress Notes (Signed)
°  Inpatient Rehabilitation Admissions Coordinator   Rehab consult received. Patient lacks the caregiver supports after a CIR admit that he is projected to need. Therefore SNF is recommended as we also recommended 10/18/21. Recommend he return to SNF rehab. I contacted Reandra, TOC SW to make aware of our recommendation.  Danne Baxter, RN, MSN Rehab Admissions Coordinator [phone] 11/15/2021 4:07 PM

## 2021-11-15 NOTE — Progress Notes (Signed)
Physical Therapy Treatment Patient Details Name: Robert [name] MRN: [id number] DOB: [birth year] Today's Date: 11/15/2021   History of Present Illness 61 y/o male presented to ED on 11/08/21 for R lower quadrant pain. CT abdomen negative. CXR negative. Found to have UTI. Tested positive for COVID-19. Recently admitted 1/14-1/19 for HTN urgency and d/c'ed to SNF. PMH includes IDDM, CKD s/p renal transplant, HTN, CAD s/p CABG 85/2778, embolic CVA    PT Comments    Pt motivated to progress mobility, eager to practice stair training. Pt overall requiring close guard to supervision level for bed mobility, transfers, gait, and stair training. Pt progressing well and activity tolerance is improving. PT feels pt can go home, and pt eager to d/c home. PT spoke with pt about setting up home to live on first floor while he recovers, then work towards making x1 trip upstairs a day with supervision of family as he improves. Pt states his wife works during the day, but could have supervision level assist from brother and mother. Pt called and spoke with wife, both agreeable to home with [facility] services. Will continue to follow.    Recommendations for follow up therapy are one component of a multi-disciplinary discharge planning process, led by the attending physician.  Recommendations may be updated based on patient status, additional functional criteria and insurance authorization.  Follow Up Recommendations  Home health PT     Assistance Recommended at Discharge Intermittent Supervision/Assistance  Patient can return home with the following A little help with walking and/or transfers;A little help with bathing/dressing/bathroom;Assist for transportation   Equipment Recommendations  Rolling walker (2 wheels);BSC/3in1    Recommendations for Other Services       Precautions / Restrictions Precautions Precautions: Fall;Sternal Precaution Booklet Issued: No Restrictions Weight Bearing Restrictions:  No Other Position/Activity Restrictions: sternal precautions     Mobility  Bed Mobility Overal bed mobility: Needs Assistance Bed Mobility: Rolling, Sidelying to Sit Rolling: Supervision Sidelying to sit: Supervision       General bed mobility comments: for safety, increased time but no physical assist    Transfers Overall transfer level: Needs assistance Equipment used: Rolling walker (2 wheels) Transfers: Sit to/from Stand Sit to Stand: Min guard           General transfer comment: for safety. cues for safe hand placement. STS x3, from EOB x1 and recliner x2.    Ambulation/Gait Ambulation/Gait assistance: Supervision Gait Distance (Feet): 70 Feet (x2) Assistive device: Rolling walker (2 wheels) Gait Pattern/deviations: Decreased stride length, Trunk flexed, Narrow base of support, Step-through pattern Gait velocity: decr     General Gait Details: for safety, cues for upright posture, increased foot clearance. Sitting rest break after first 70 ft prior to stair training.   Stairs Stairs: Yes Stairs assistance: Min guard Stair Management: One rail Right, Step to pattern, Forwards Number of Stairs: 10 General stair comments: close guard for safety, cues for safe hand placement, sequencing (up with the good leg, down with the bad leg leading), semi-sideways navigation so both UEs can be on railing   Wheelchair Mobility    Modified Rankin (Stroke Patients Only)       Balance Overall balance assessment: Needs assistance Sitting-balance support: No upper extremity supported, Feet supported Sitting balance-Leahy Scale: Fair     Standing balance support: Bilateral upper extremity supported, Reliant on assistive device for balance, During functional activity Standing balance-Leahy Scale: Poor Standing balance comment: reliant on RW  Cognition Arousal/Alertness: Awake/alert Behavior During Therapy: WFL for tasks  assessed/performed Overall Cognitive Status: Within Functional Limits for tasks assessed                                          Exercises      General Comments        Pertinent Vitals/Pain Pain Assessment Pain Assessment: No/denies pain Pain Intervention(s): Monitored during session    Home Living                          Prior Function            PT Goals (current goals can now be found in the care plan section) Acute Rehab PT Goals Patient Stated Goal: to return home once independent PT Goal Formulation: With patient Time For Goal Achievement: 11/24/21 Potential to Achieve Goals: Good Progress towards PT goals: Progressing toward goals    Frequency    Min 3X/week      PT Plan Discharge plan needs to be updated    Co-evaluation              AM-PAC PT "6 Clicks" Mobility   Outcome Measure  Help needed turning from your back to your side while in a flat bed without using bedrails?: A Little Help needed moving from lying on your back to sitting on the side of a flat bed without using bedrails?: A Little Help needed moving to and from a bed to a chair (including a wheelchair)?: A Little Help needed standing up from a chair using your arms (e.g., wheelchair or bedside chair)?: A Little Help needed to walk in hospital room?: A Little Help needed climbing 3-5 steps with a railing? : A Little 6 Click Score: 18    End of Session Equipment Utilized During Treatment: Gait belt Activity Tolerance: Patient tolerated treatment well Patient left: in chair;with call bell/phone within reach Nurse Communication: Mobility status PT Visit Diagnosis: Other abnormalities of gait and mobility (R26.89);Dizziness and giddiness (R42)     Time: 6073-7106 PT Time Calculation (min) (ACUTE ONLY): 38 min  Charges:  $Gait Training: 23-37 mins $Therapeutic Activity: 8-22 mins                     Stacie Glaze, PT DPT Acute Rehabilitation  Services Pager [phone]  Office [phone]   [place] 11/15/2021, 4:58 PM

## 2021-11-15 NOTE — Progress Notes (Signed)
ANTICOAGULATION/ANTIMICROBIAL CONSULT NOTE - Initial Consult  Pharmacy Consult for Coumadin Indication:  AVR/CVA and cystitis  No Known Allergies  Vital Signs: Temp: 98.2 F (36.8 C) (02/08 0751) Temp Source: Oral (02/08 0751) BP: 142/58 (02/08 0751) Pulse Rate: 72 (02/08 0751)  Labs: Recent Labs    11/13/21 0449 11/14/21 0620 11/15/21 0338  HGB 11.5*  --   --   HCT 34.7*  --   --   PLT 247  --   --   LABPROT 25.9* 24.2* 24.4*  INR 2.4* 2.2* 2.2*  CREATININE 2.34*  --   --      Estimated Creatinine Clearance: 44.2 mL/min (A) (by C-G formula based on SCr of 2.34 mg/dL (H)).   Medical History: Past Medical History:  Diagnosis Date   Anemia    low hgb at present   Aortic valve stenosis    Arthritis    FEET   Blood transfusion without reported diagnosis 1974   kidney removed - L   Cancer ([place])    skin ca right shoulder, plastic dsyplasia, pre-Ca polpys removed on Colonoscopy- 07/2014   Colon polyps 07/23/2014   Tubular adenomas x 5   Constipation    Coronary artery disease    Diabetes mellitus without complication ([place])    Type 2   Difficult intubation    unsure of actual problem but it was during the January 28, 2015 procedure.   Dysrhythmia    Eczema    ESRD (end stage renal disease) ([place])    hemodialysis 06/2014-02/2017, s/p living unrelated kidney transplant 02/26/17   GI bleed 07/26/2017   Headache(784.0)    slight dull h/a due kidney failure   Heart murmur    aortic stenosis (moderate-severe 04/2021)   Hyperlipidemia    Hypertension    SVT h/o , followed by Dr. Claiborne Billings   Hypertensive urgency 10/21/2021   Hypothyroidism    Macular degeneration    Neuropathy    right hand and both feet   PAT (paroxysmal atrial tachycardia) ([place])    Peritoneal dialysis catheter in place [facility]) 02/24/2015   PSVT (paroxysmal supraventricular tachycardia) (HCC)    SBO (small bowel obstruction) ([place]) 05/2015   Shortness of breath    with exertion   Sleep apnea 06/18/2011    split-night sleep study- [facility]., BiPAP- 13-16   Thyroid disease    Umbilical hernia    s/p repair 04/28/15    Assessment: 61yo male admitted for IV ABX for cystitis in setting of immunosuppression (h/o kidney transplant), also to continue Coumadin for AVR and h/o stroke with Lovenox bridge; of note pt was reportedly on Lovenox "bridge" at [name] but per SNF MAR pt rec'd only one dose of Lovenox 40mg ; last dose of Coumadin taken 2/1.  INR 2.2 today. Planning to dc to SNF. We will continue with home dose.   PTA Coumadin 5mg  qday  Goal of Therapy:  INR 2-2.5 Anti-Xa level 0.6-1 units/ml 4hrs after LMWH dose given Monitor platelets by anticoagulation protocol: Yes   Plan:  Coumadin 5mg  PO qday Daily INR  Onnie Boer, PharmD, BCIDP, AAHIVP, CPP Infectious Disease Pharmacist 11/15/2021 9:23 AM

## 2021-11-16 LAB — PROTIME-INR
INR: 2.2 — ABNORMAL HIGH (ref 0.8–1.2)
Prothrombin Time: 24 seconds — ABNORMAL HIGH (ref 11.4–15.2)

## 2021-11-16 LAB — GLUCOSE, CAPILLARY
Glucose-Capillary: 152 mg/dL — ABNORMAL HIGH (ref 70–99)
Glucose-Capillary: 248 mg/dL — ABNORMAL HIGH (ref 70–99)

## 2021-11-16 MED ORDER — AMOXICILLIN-POT CLAVULANATE 500-125 MG PO TABS: 1.0000 | ORAL_TABLET | Freq: Two times a day (BID) | ORAL | 0 refills | Status: AC

## 2021-11-16 MED ORDER — HYDRALAZINE HCL 100 MG PO TABS: 100.0000 mg | ORAL_TABLET | Freq: Three times a day (TID) | ORAL | 0 refills | Status: DC

## 2021-11-16 MED ORDER — ROSUVASTATIN CALCIUM 40 MG PO TABS: 40.0000 mg | ORAL_TABLET | Freq: Every day | ORAL | 0 refills | Status: DC

## 2021-11-16 NOTE — Progress Notes (Signed)
ANTICOAGULATION CONSULT NOTE -follow-up  Pharmacy Consult for Coumadin Indication: AVR/CVA  No Known Allergies  Vital Signs: Temp: 98.6 F (37 C) (02/09 0732) Temp Source: Oral (02/09 0732) BP: 132/57 (02/09 0732) Pulse Rate: 81 (02/09 0732)  Labs: Recent Labs    11/14/21 0620 11/15/21 0338 11/16/21 0131  LABPROT 24.2* 24.4* 24.0*  INR 2.2* 2.2* 2.2*     Estimated Creatinine Clearance: 43.6 mL/min (A) (by C-G formula based on SCr of 2.34 mg/dL (H)).   Medical History: Past Medical History:  Diagnosis Date   Anemia    low hgb at present   Aortic valve stenosis    Arthritis    FEET   Blood transfusion without reported diagnosis 1974   kidney removed - L   Cancer ([place])    skin ca right shoulder, plastic dsyplasia, pre-Ca polpys removed on Colonoscopy- 07/2014   Colon polyps 07/23/2014   Tubular adenomas x 5   Constipation    Coronary artery disease    Diabetes mellitus without complication ([place])    Type 2   Difficult intubation    unsure of actual problem but it was during the January 28, 2015 procedure.   Dysrhythmia    Eczema    ESRD (end stage renal disease) ([place])    hemodialysis 06/2014-02/2017, s/p living unrelated kidney transplant 02/26/17   GI bleed 07/26/2017   Headache(784.0)    slight dull h/a due kidney failure   Heart murmur    aortic stenosis (moderate-severe 04/2021)   Hyperlipidemia    Hypertension    SVT h/o , followed by Dr. Claiborne Billings   Hypertensive urgency 10/21/2021   Hypothyroidism    Macular degeneration    Neuropathy    right hand and both feet   PAT (paroxysmal atrial tachycardia) ([place])    Peritoneal dialysis catheter in place [facility]) 02/24/2015   PSVT (paroxysmal supraventricular tachycardia) (HCC)    SBO (small bowel obstruction) ([place]) 05/2015   Shortness of breath    with exertion   Sleep apnea 06/18/2011   split-night sleep study- [facility]., BiPAP- 13-16   Thyroid disease    Umbilical hernia    s/p repair  04/28/15    Assessment: 61yo male admitted for IV ABX for cystitis in setting of immunosuppression (h/o kidney transplant), also to continue Coumadin for AVR and h/o stroke.  INR 2.2 today. Planning to dc to SNF. We will continue with home dose.   PTA Coumadin 5mg  qday  Goal of Therapy:  INR 2-2.5 Monitor platelets by anticoagulation protocol: Yes   Plan:  Coumadin 5mg  PO qday Daily INR  Jafar Poffenberger BS, PharmD, BCPS Clinical Pharmacist 11/16/2021 9:12 AM

## 2021-11-16 NOTE — TOC Transition Note (Signed)
Transition of Care [facility]) - CM/SW Discharge Note   Patient Details  Name: Robert [name] MRN: [id number] Date of Birth: 01/25/1961  Transition of Care [facility]) CM/SW Contact:  Tom-Johnson, Renea Ee, RN Phone Number: 11/16/2021, 10:02 AM   Clinical Narrative:     Patient is scheduled for discharge today. Admitted for Acute Cystitis. From [employer] but declines returning back. Requests to go home with home health PT/OT. States he has no agency preference. CM made referral to [facility] and Tommi Rumps [phone]) noted acceptance. Rolling walker ordered from [place] [phone]) to deliver at bedside. States his wife or brother will transport at discharge. No further TOC needs noted.  Final next level of care: [place] Barriers to Discharge: Barriers Resolved   Patient Goals and CMS Choice Patient states their goals for this hospitalization and ongoing recovery are:: To return home CMS Medicare.gov Compare Post Acute Care list provided to:: Patient Choice offered to / list presented to : Patient  Discharge Placement                Patient to be transferred to facility by: Wife or Brother      Discharge Plan and Services                DME Arranged: Walker rolling DME Agency: [employer] Date DME Agency Contacted: 11/16/21 Time DME Agency Contacted: 1000 Representative spoke with at DME Agency: Freda Munro HH Arranged: PT, OT [place] Agency: [place] Date [place]: 11/15/21 Time [place]: [place] Representative spoke with at [place]: [place] ([place]) Interventions     Readmission Risk Interventions Readmission Risk Prevention Plan 11/10/2021  Transportation Screening Complete  Medication Review Press photographer) Referral to Pharmacy  PCP or Specialist appointment within 3-5 days of discharge Not Complete  PCP/Specialist Appt Not Complete comments Patient not ready for discharge   [place] or [place] Not Complete  HRI or Home Care Consult Pt Refusal Comments possible SNF placement  SW Recovery Care/Counseling Consult Complete  Palliative Care Screening Not [place] Not Complete  SNF Comments pending PT/ OT orders  Some recent data might be hidden

## 2021-11-16 NOTE — Discharge Summary (Signed)
DUJUAN STANKOWSKI JGO:[id number] DOB: 08/18/1961 DOA: 11/08/2021  PCP: Haywood Pao, MD  Admit date: 11/08/2021  Discharge date: 11/16/2021  Admitted From: SNF   Disposition:  SNF   Recommendations for Outpatient Follow-up:   Follow up with PCP in 1-2 weeks  PCP Please obtain BMP/CBC, 2 view CXR in 1week,  (see Discharge instructions)   PCP Please follow up on the following pending results: Monitor INR closely along with CBC and CMP   Home Health: None Equipment/Devices: None  Consultations: Nephrology, ID -phone Discharge Condition: Stable    CODE STATUS: Full    Diet Recommendation: Renal-low carbohydrate diet with 1.5 L fluid restriction per day.  Check CBGs q. ACH S.   No chief complaint on file.    Brief history of present illness from the day of admission and additional interim summary    61 year old male with a history of renal transplant at [facility], severe obstructive sleep apnea, status post AVR in December 2022, status post CABG in December 2022, hypertension, type 2 diabetes on insulin, recent stroke requiring systemic anticoagulation with Coumadin due to concern for thrombus, presents to the ER from the nursing home today.  Patient started having severe right lower quadrant pain, he was admitted for pyelonephritis and a transplant kidney.   Patient initially [place] 11/13/21 - awaiting bed, remains physically stable for discharge.                                                                 Hospital Course   Acute pyelonephritis with right lower quadrant abdominal pain at the site of transplant kidney.  In a patient with immunocompromise, due to ESBL Klebsiella infection in the urine.  Blood cultures were negative.  He initially responded very well to Zosyn there after few days of imipenem, 3 his  symptoms have completely resolved.  He has so far received 5 days of appropriate IV antibiotic treatment with defervescent's office infection clinically.  Will be switched to Augmentin and this was discussed with ID Dr. Graylon Good in detail.  Continue Augmentin; stop date 11/21/21.  He must follow-up with his nephrologist within 7 days of discharge.  Please monitor CMP closely at SNF.      2.  History of kidney transplant at [facility]  For now home dose prednisone should suffice, does not appear toxic blood pressure stable, was seen by nephrology his transplant medications were continued and he was hydrated.  Renal function is close to his recent base.   3.  AKI on CKD 3B.  Due to acute infection along with dehydration, also has hyponatremia.  Baseline creatinine around 2.5.  He has quickly hydrated and renal function now at baseline, we had held his HCTZ and ACE inhibitor, his urine looks much lighter in color on 11/11/2021, will function close  to baseline we will resume HCTZ, have replaced ACE inhibitor with hydralazine.  Once he follows up with his primary nephrologist and renal function remained stable he can be switched back to ACE inhibitor at that time by his primary nephrologist  4.  Hx of CABG and AVR in December 2022 by Dr. Caffie Pinto.  He is stable continue combination of high intensity statin and beta-blocker for secondary prevention also on Coumadin.   5.  HX of CVA.  Thought to be embolic.  On Coumadin with therapeutic INR of 2.4 today.  Monitor closely at SNF.   6.  Dyslipidemia.  On high intensity statin.   7.  Hypothyroidism.  Continue Synthroid.   8. Obesity with OSA.  BMI of 37, follow with PCP for weight loss.  Nighttime BiPAP.   9.  Recent history of COVID-19 infection.  Around 2 weeks ago. CT here is 28, continue isolation while here.   10.HTN - on combination of Coreg, Norvasc continuing to hold ACE inhibitor due to recent AKI, low-dose hydralazine added in place due to AKI.  Monitor at  SNF.   11. DM type II.  On insulin continue home regimen.  Check CBGs q. [place].   Discharge diagnosis     Principal Problem:   Acute cystitis without hematuria Active Problems:   Severe obstructive sleep apnea   Hypothyroidism   Stage 3b chronic kidney disease (CKD) (HCC)   Type 2 diabetes mellitus with hyperglycemia, with long-term current use of insulin (HCC)   Morbid obesity (HCC)   Kidney transplant status   S/P AVR (aortic valve replacement)   Cerebrovascular accident (CVA) due to embolism of precerebral artery (HCC)   Anemia in chronic kidney disease   Benign essential HTN   Anticoagulated on Coumadin   Hyponatremia    Discharge instructions    Discharge Instructions     Discharge instructions   Complete by: As directed    Follow with Primary MD [name], [name], MD in 7 days   Get CBC, CMP, INR-  checked next visit within 2 days by SNF MD   Activity: As tolerated with Full fall precautions use walker/cane & assistance as needed  Disposition SNF  Diet: Renal low carbohydrate diet with 1.5 L fluid restriction.  Of note patient is on Coumadin.  Check CBGs q. ACHS at SNF.  Check your Weight same time everyday, if you gain over 2 pounds, or you develop in leg swelling, experience more shortness of breath or chest pain, call your Primary MD immediately. Follow Cardiac Low Salt Diet and 1.5 lit/day fluid restriction.  Special Instructions: If you have smoked or chewed Tobacco  in the last 2 yrs please stop smoking, stop any regular Alcohol  and or any Recreational drug use.  On your next visit with your primary care physician please Get Medicines reviewed and adjusted.  Please request your Prim.MD to go over all Hospital Tests and Procedure/Radiological results at the follow up, please get all Hospital records sent to your Prim MD by signing hospital release before you go home.  If you experience worsening of your admission symptoms, develop shortness of breath,  life threatening emergency, suicidal or homicidal thoughts you must seek medical attention immediately by calling 911 or calling your MD immediately  if symptoms less severe.  You Must read complete instructions/literature along with all the possible adverse reactions/side effects for all the Medicines you take and that have been prescribed to you. Take any new Medicines after you have  completely understood and accpet all the possible adverse reactions/side effects.   Increase activity slowly   Complete by: As directed        Discharge Medications   Allergies as of 11/16/2021   No Known Allergies      Medication List     STOP taking these medications    atorvastatin 80 MG tablet Commonly known as: LIPITOR   lisinopril 40 MG tablet Commonly known as: ZESTRIL       TAKE these medications    acetaminophen 325 MG tablet Commonly known as: TYLENOL Take 2 tablets (650 mg total) by mouth every 4 (four) hours as needed for moderate pain.   ALPRAZolam 0.25 MG tablet Commonly known as: XANAX Take 1 tablet (0.25 mg total) by mouth See admin instructions. Every 8 hours as needed for anxiety x 2 weeks   amiodarone 200 MG tablet Commonly known as: PACERONE Take 1 tablet (200 mg total) by mouth 2 (two) times daily.   amLODipine 10 MG tablet Commonly known as: NORVASC TAKE 1 TABLET BY MOUTH EVERY DAY   amoxicillin-clavulanate 500-125 MG tablet Commonly known as: Augmentin Take 1 tablet (500 mg total) by mouth 2 (two) times daily for 9 days.   carvedilol 25 MG tablet Commonly known as: COREG Take 1 tablet (25 mg total) by mouth 2 (two) times daily with a meal.   cholecalciferol 25 MCG (1000 UNIT) tablet Commonly known as: VITAMIN D3 Take 1,000 Units by mouth in the morning and at bedtime.   cloNIDine 0.1 MG tablet Commonly known as: CATAPRES Take 0.1 mg by mouth every 8 (eight) hours as needed (SBP >160).   doxazosin 2 MG tablet Commonly known as: CARDURA Take 1 tablet  (2 mg total) by mouth daily. What changed: when to take this   DULoxetine 60 MG capsule Commonly known as: CYMBALTA Take 60 mg by mouth daily.   enoxaparin 40 MG/0.4ML injection Commonly known as: LOVENOX Inject 40 mg into the skin once.   gabapentin 100 MG capsule Commonly known as: NEURONTIN Take 100 mg by mouth 2 (two) times daily.   hydrALAZINE 100 MG tablet Commonly known as: APRESOLINE Take 1 tablet (100 mg total) by mouth every 8 (eight) hours.   hydrochlorothiazide 12.5 MG capsule Commonly known as: MICROZIDE Take 12.5 mg by mouth daily.   insulin aspart 100 UNIT/ML injection Commonly known as: novoLOG Inject 0-9 Units into the skin 3 (three) times daily with meals. What changed:  how much to take when to take this additional instructions   insulin glargine 100 unit/mL Sopn Commonly known as: LANTUS Inject 10 Units into the skin daily.   levothyroxine 100 MCG tablet Commonly known as: SYNTHROID TAKE 1 TABLET BY MOUTH EVERY DAY What changed: when to take this   mycophenolate 250 MG capsule Commonly known as: CELLCEPT Take 1,000 mg by mouth every 12 (twelve) hours.   NON FORMULARY CPAP/BIPAP  at bedtime   Nulojix 250 MG Solr injection Generic drug: belatacept Inject 24.5 mLs into the vein every 28 (twenty-eight) days.   predniSONE 5 MG tablet Commonly known as: DELTASONE Take 5 mg by mouth daily with breakfast. Continuously   PROAIR RESPICLICK IN Inhale 2 puffs into the lungs See admin instructions. Tid x 3 days   rosuvastatin 40 MG tablet Commonly known as: CRESTOR Take 1 tablet (40 mg total) by mouth daily.   senna-docusate 8.6-50 MG tablet Commonly known as: Senokot-S Take 1 tablet by mouth at bedtime as needed for mild constipation.  tamsulosin 0.4 MG Caps capsule Commonly known as: FLOMAX Take 0.8 mg by mouth at bedtime.   testosterone cypionate 200 MG/ML injection Commonly known as: DEPOTESTOSTERONE CYPIONATE Inject 0.6 mLs (120 mg  total) into the muscle every 7 (seven) days. What changed: when to take this   traZODone 50 MG tablet Commonly known as: DESYREL Take 50 mg by mouth See admin instructions. As needed at bedtime for insomnia x 14 days   warfarin 5 MG tablet Commonly known as: COUMADIN Take 5 mg by mouth daily at 6 PM. What changed: Another medication with the same name was removed. Continue taking this medication, and follow the directions you see here.         Follow-up Information     [name], [name], MD. Schedule an appointment as soon as possible for a visit in 1 week(s).   Specialty: Internal Medicine Why: Along with your nephrologist and cardiologist within 7 to 10 days of discharge. Contact information: [address] [place] [address] [phone]         Troy Sine, MD .   Specialty: Cardiology Contact information: [address] [place] [address] [phone]                 Major procedures and Radiology Reports - PLEASE review detailed and final reports thoroughly  -      CT ABDOMEN PELVIS WO CONTRAST  Result Date: 11/09/2021 CLINICAL DATA:  Right lower quadrant abdominal pain, renal transplant EXAM: CT ABDOMEN AND PELVIS WITHOUT CONTRAST TECHNIQUE: Multidetector CT imaging of the abdomen and pelvis was performed following the standard protocol without IV contrast. RADIATION DOSE REDUCTION: This exam was performed according to the departmental dose-optimization program which includes automated exposure control, adjustment of the mA and/or kV according to patient size and/or use of iterative reconstruction technique. COMPARISON:  10/09/2021 FINDINGS: Lower chest: Small bilateral pleural effusions. Associated lower lobe atelectasis. This is grossly unchanged. Hepatobiliary: Unenhanced liver is unremarkable. Layering gallstones and/or gallbladder sludge (series 3/image 31), without associated laboratory changes. No intrahepatic or extrahepatic  duct dilatation. Pancreas: Within normal limits. Spleen: Within normal limits. Adrenals/Urinary Tract: Adrenal glands are within normal limits. Status post left nephrectomy. Mild right renal atrophy. No hydronephrosis. Right lower quadrant renal transplant without hydronephrosis. Bladder is mildly thick-walled although underdistended. Stomach/Bowel: Stomach is within normal limits. No evidence of bowel obstruction. Normal appendix (series 3/image 87). Left colonic diverticulosis, without evidence of diverticulitis. Vascular/Lymphatic: No evidence of abdominal aortic aneurysm. Atherosclerotic calcifications of the abdominal aorta and branch vessels. No suspicious abdominopelvic lymphadenopathy. Reproductive: Prostate is unremarkable. Other: No abdominopelvic ascites. Musculoskeletal: Degenerative changes of the visualized thoracolumbar spine, most prominent L1-2. IMPRESSION: Right lower quadrant renal transplant without hydronephrosis. No evidence of bowel obstruction. Normal appendix. Left colonic diverticulosis, without evidence of diverticulitis. Cholelithiasis, without associated inflammatory changes. Small bilateral pleural effusions, unchanged. Electronically Signed   By: Julian Hy M.D.   On: 11/09/2021 01:36   CT HEAD WO CONTRAST (5MM)  Result Date: 10/21/2021 CLINICAL DATA:  Headache, dizziness, weakness EXAM: CT HEAD WITHOUT CONTRAST TECHNIQUE: Contiguous axial images were obtained from the base of the skull through the vertex without intravenous contrast. RADIATION DOSE REDUCTION: This exam was performed according to the departmental dose-optimization program which includes automated exposure control, adjustment of the mA and/or kV according to patient size and/or use of iterative reconstruction technique. COMPARISON:  10/07/2021, 10/11/2021 FINDINGS: Brain: Hypodensities within the left frontal parietal periventricular white matter consistent with areas of acute infarct  seen on previous MRI. No  new infarct or hemorrhage. Lateral ventricles and midline structures are unremarkable. No acute extra-axial fluid collections. No mass effect. Vascular: No hyperdense vessel or unexpected calcification. Stable atherosclerosis. Skull: Normal. Negative for fracture or focal lesion. Sinuses/Orbits: No acute finding. Other: None. IMPRESSION: 1. Subacute small vessel ischemic changes within the left frontal parietal white matter, corresponding to previous MRI findings. 2. No acute infarct or hemorrhage. Electronically Signed   By: Randa Ngo M.D.   On: 10/21/2021 19:11   NM Pulmonary Perfusion  Result Date: 10/24/2021 CLINICAL DATA:  Onset of chest pain this morning, anxiousness, dyspnea, history of CABG 10/05/2021, AVR 10/05/2021, hypertension, type II diabetes mellitus, paroxysmal SVT EXAM: NUCLEAR MEDICINE PERFUSION LUNG SCAN TECHNIQUE: Perfusion images were obtained in multiple projections after intravenous injection of radiopharmaceutical. Ventilation scans intentionally deferred if perfusion scan and chest x-ray adequate for interpretation during COVID 19 epidemic. RADIOPHARMACEUTICALS:  4.1 mCi Tc-53m MAA IV COMPARISON:  Chest radiograph 10/24/2021 FINDINGS: Flattening of posterior lungs consistent with layered pleural effusions. No segmental or subsegmental perfusion defects. IMPRESSION: BILATERAL pleural effusions. Pulmonary embolism absent. Electronically Signed   By: Lavonia Dana M.D.   On: 10/24/2021 13:47   DG Chest Port 1 View  Result Date: 11/09/2021 CLINICAL DATA:  Questionable sepsis. EXAM: PORTABLE CHEST 1 VIEW COMPARISON:  Chest x-ray 10/25/2021. FINDINGS: Sternotomy wires and prosthetic heart valve are again seen. The cardiomediastinal silhouette is enlarged unchanged. There is central pulmonary vascular congestion. There is no lung consolidation, pleural effusion or pneumothorax identified. No acute fractures are seen. IMPRESSION: 1. Cardiomegaly with central pulmonary vascular congestion.  Electronically Signed   By: Ronney Asters M.D.   On: 11/09/2021 00:24   DG CHEST PORT 1 VIEW  Result Date: 10/25/2021 CLINICAL DATA:  Shortness of breath, CABG EXAM: PORTABLE CHEST 1 VIEW COMPARISON:  Multiple prior chest radiographs including most recent dated October 24, 2021 FINDINGS: The heart is normal in size. Evidence of prior CABG. Pulmonary vascular congestion with small bilateral pleural effusions, unchanged. No pneumothorax. Osseous structures are unremarkable. IMPRESSION: Mild pulmonary vascular congestion and small bilateral pleural effusions, unchanged. Electronically Signed   By: Keane Police D.O.   On: 10/25/2021 09:10   DG Chest Port 1 View  Result Date: 10/24/2021 CLINICAL DATA:  Bypass surgery. EXAM: PORTABLE CHEST 1 VIEW COMPARISON:  10/21/2021 FINDINGS: The cardiac silhouette, mediastinal and hilar contours are within normal limits and stable. Stable surgical changes bypass surgery. There are persistent bilateral pleural effusions and bibasilar atelectasis but overall slight improved aeration. No pneumothorax. IMPRESSION: Persistent bilateral pleural effusions and bibasilar atelectasis. No edema or pneumothorax. Electronically Signed   By: Marijo Sanes M.D.   On: 10/24/2021 08:03   DG Chest Portable 1 View  Result Date: 10/21/2021 CLINICAL DATA:  61 year old male with history of shortness of breath, dizziness and weakness. EXAM: PORTABLE CHEST 1 VIEW COMPARISON:  Chest x-ray 10/09/2021. FINDINGS: Bibasilar opacities may reflect areas of atelectasis and/or consolidation, with superimposed small bilateral pleural effusions. No pneumothorax. No evidence of pulmonary edema. Heart size is mildly enlarged. Upper mediastinal contours are within normal limits. Status post median sternotomy for aortic valve replacement with what appears to be a stented bioprosthesis. IMPRESSION: 1. Bibasilar areas of atelectasis and/or consolidation with superimposed small bilateral pleural effusions.  Electronically Signed   By: Vinnie Langton M.D.   On: 10/21/2021 12:09   ECHOCARDIOGRAM LIMITED  Result Date: 10/24/2021    ECHOCARDIOGRAM LIMITED REPORT   Patient Name:   Robert [name]  Ressel Date of Exam: 10/24/2021 Medical Rec #:  [id number]        Height:       71.0 in Accession #:    [id number]       Weight:       270.0 lb Date of Birth:  12-07-1960        BSA:          2.395 m Patient Age:    61 years         BP:           159/80 mmHg Patient Gender: M                HR:           76 bpm. Exam Location:  Inpatient Procedure: Limited Echo, Cardiac Doppler and Color Doppler Indications:    Dyspnea  History:        Patient has prior history of Echocardiogram examinations, most                 recent 05/02/2021. Risk Factors:Diabetes and Hypertension.                 Aortic Valve: 27 mm Edwards INSPIRIS RESILIA pericardial valve                 valve is present in the aortic position. Procedure Date:                 10/05/2021.  Sonographer:    Glo Herring Referring Phys: [id number] [place]  1. Left ventricular ejection fraction, by estimation, is 60 to 65%. The left ventricle has normal function. The left ventricle has no regional wall motion abnormalities. There is moderate concentric left ventricular hypertrophy.  2. Right ventricular systolic function is normal. The right ventricular size is normal.  3. The mitral valve is degenerative.  4. Normal 27 mm Edwards Inspiris Resilia aortic prosthesis. Vmax 2.3 m/s, MG 12 mmHG, EOA 2.14 cm2, DI 0.26. The aortic valve has been repaired/replaced. Aortic valve regurgitation is not visualized. There is a 27 mm Edwards INSPIRIS RESILIA pericardial  valve valve present in the aortic position. Procedure Date: 10/05/2021.  5. The inferior vena cava is dilated in size with >50% respiratory variability, suggesting right atrial pressure of 8 mmHg. Comparison(s): Changes from prior study are noted. AoV now replaced. FINDINGS  Left Ventricle: Left  ventricular ejection fraction, by estimation, is 60 to 65%. The left ventricle has normal function. The left ventricle has no regional wall motion abnormalities. The left ventricular internal cavity size was normal in size. There is  moderate concentric left ventricular hypertrophy. Abnormal (paradoxical) septal motion consistent with post-operative status. Right Ventricle: The right ventricular size is normal. No increase in right ventricular wall thickness. Right ventricular systolic function is normal. Left Atrium: Left atrial size was normal in size. Right Atrium: Right atrial size was normal in size. Pericardium: Trivial pericardial effusion is present. Mitral Valve: The mitral valve is degenerative in appearance. Mild to moderate mitral annular calcification. Tricuspid Valve: The tricuspid valve is grossly normal. Tricuspid valve regurgitation is trivial. No evidence of tricuspid stenosis. Aortic Valve: Normal 27 mm Edwards Inspiris Resilia aortic prosthesis. Vmax 2.3 m/s, MG 12 mmHG, EOA 2.14 cm2, DI 0.26. The aortic valve has been repaired/replaced. Aortic valve regurgitation is not visualized. Aortic valve mean gradient measures 12.0 mmHg. Aortic valve peak gradient measures 21.7 mmHg. Aortic valve area, by VTI measures 2.14 cm. There is a 27 mm  Edwards INSPIRIS RESILIA pericardial valve valve present in the aortic position. Procedure Date: 10/05/2021. Venous: The inferior vena cava is dilated in size with greater than 50% respiratory variability, suggesting right atrial pressure of 8 mmHg. LEFT VENTRICLE PLAX 2D LVIDd:         4.70 cm   Diastology LVIDs:         3.30 cm   LV e' medial:    7.13 cm/s LV PW:         1.25 cm   LV E/e' medial:  17.5 LV IVS:        1.49 cm   LV e' lateral:   6.45 cm/s LVOT diam:     2.10 cm   LV E/e' lateral: 19.4 LV SV:         92 LV SV Index:   38 LVOT Area:     3.46 cm  IVC IVC diam: 2.30 cm LEFT ATRIUM         Index LA diam:    5.30 cm 2.21 cm/m  AORTIC VALVE AV Area  (Vmax):    2.14 cm AV Area (Vmean):   2.30 cm AV Area (VTI):     2.14 cm AV Vmax:           233.00 cm/s AV Vmean:          161.000 cm/s AV VTI:            0.428 m AV Peak Grad:      21.7 mmHg AV Mean Grad:      12.0 mmHg LVOT Vmax:         144.00 cm/s LVOT Vmean:        107.000 cm/s LVOT VTI:          0.265 m LVOT/AV VTI ratio: 0.62 MITRAL VALVE MV Area (PHT): 2.61 cm     SHUNTS MV Decel Time: 291 msec     Systemic VTI:  0.26 m MV E velocity: 125.00 cm/s  Systemic Diam: 2.10 cm MV A velocity: 112.00 cm/s MV E/A ratio:  1.12 Eleonore Chiquito MD Electronically signed by Eleonore Chiquito MD Signature Date/Time: 10/24/2021/12:42:32 PM    Final       Today   Subjective    [name] today has no headache,no chest abdominal pain,no new weakness tingling or numbness, feels much better   Objective   Blood pressure (!) 132/57, pulse 81, temperature 98.6 F (37 C), temperature source Oral, resp. rate 18, height 5\' 11"  (1.803 m), weight 122.3 kg, SpO2 95 %.   Intake/Output Summary (Last 24 hours) at 11/16/2021 0755 Last data filed at 11/16/2021 0600 Gross per 24 hour  Intake 919.87 ml  Output 1650 ml  Net -730.13 ml     Exam  Awake Alert, No new F.N deficits,    [place].AT,PERRAL Supple Neck,   Symmetrical Chest wall movement, Good air movement bilaterally, CTAB RRR,No Gallops,   +ve B.Sounds, Abd Soft, Non tender,  No Cyanosis, Clubbing or edema    Data Review   CBC w Diff:  Lab Results  Component Value Date   WBC 8.1 11/13/2021   HGB 11.5 (L) 11/13/2021   HGB 15.1 08/18/2021   HCT 34.7 (L) 11/13/2021   HCT 45.8 08/18/2021   PLT 247 11/13/2021   PLT 279 08/18/2021   LYMPHOPCT 9 11/13/2021   MONOPCT 7 11/13/2021   EOSPCT 4 11/13/2021   BASOPCT 1 11/13/2021    CMP:  Lab Results  Component Value Date   NA  135 11/13/2021   NA 137 08/18/2021   K 3.7 11/13/2021   CL 105 11/13/2021   CO2 20 (L) 11/13/2021   BUN 28 (H) 11/13/2021   BUN 30 (H) 08/18/2021   CREATININE 2.34 (H)  11/13/2021   GLU 203 07/07/2019   PROT 5.5 (L) 11/13/2021   ALBUMIN 2.8 (L) 11/13/2021   BILITOT 0.5 11/13/2021   ALKPHOS 83 11/13/2021   AST 24 11/13/2021   ALT 40 11/13/2021  .  Lab Results  Component Value Date   HGBA1C 7.1 (H) 10/03/2021    Total Time in preparing paper work, data evaluation and todays exam - 7 minutes  Little Ishikawa M.D on 11/16/2021 at 7:55 AM  [facility]

## 2021-11-16 NOTE — Progress Notes (Signed)
Patient ID: DEXTON ZWILLING, male   DOB: Sep 11, 1961, 61 y.o.   MRN: [id number]  Discharge instructions on AVS are for SNF/Rehab. MD notified and given Patients preferred pharmacy to send medications. Awaiting changes to be made to paperwork before discharge.  Haydee Salter, RN'

## 2021-11-17 ENCOUNTER — Telehealth: Payer: Self-pay | Admitting: Cardiovascular Disease

## 2021-11-17 NOTE — Telephone Encounter (Signed)
New Message:   Patient said he was admitted to [facility] on 11-09-21 and discharged on 11-16-21. He wants Dr Claiborne Billings to review his notes. He said they stopped his Metoprolol and Lisinopril. He is concerned about. what they stopped and also what they put him on.. They put him on Coreg, Pacerone, Clonidine, Cardura, Xanax,  Hydralazine, Hydrochlorothiazide and Warfarin.

## 2021-11-17 NOTE — Telephone Encounter (Addendum)
Spoke with patient who reports being admitted to the hospital this past week for kidney issues. His cardiac medications were changed and he wants Dr. Claiborne Billings to review his inpatient records, especially medications. He was placed on coumadin and asked if he should still take 81 mg of aspirin. I told him not to take the aspirin. He also told me the record shows he is on lovenox, but he said that is an error, he is not taking lovenox. He stated his pharmacy never received an order for clonidine 0.1 mg every 8 hr prn for sbp greater than 160. Please review patient's hospitalization and discharge notes and reassure him of the medications he should be taking. He has an appointment with Caron Presume in March.

## 2021-11-20 ENCOUNTER — Telehealth: Payer: Self-pay | Admitting: Cardiovascular Disease

## 2021-11-20 ENCOUNTER — Telehealth: Payer: Self-pay

## 2021-11-20 ENCOUNTER — Other Ambulatory Visit: Payer: Self-pay

## 2021-11-20 ENCOUNTER — Ambulatory Visit: Payer: 59 | Admitting: Podiatry

## 2021-11-20 MED ORDER — AMIODARONE HCL 200 MG PO TABS: 200.0000 mg | ORAL_TABLET | Freq: Two times a day (BID) | ORAL | 2 refills | Status: DC

## 2021-11-20 NOTE — Telephone Encounter (Signed)
Returned  call to patient regarding coumadin management. Left detailed message to advise can discuss with provider at next follow up visit.

## 2021-11-20 NOTE — Telephone Encounter (Signed)
°*  STAT* If patient is at the pharmacy, call can be transferred to refill team.   1. Which medications need to be refilled? (please list name of each medication and dose if known)  amiodarone (PACERONE) 200 MG tablet  2. Which pharmacy/location (including street and city if local pharmacy) is medication to be sent to? [id number] - [place], [place] - [address]  3. Do they need a 30 day or 90 day supply?   90 day supply   Original prescription received advised to take 1 tablet daily instead of two.

## 2021-11-20 NOTE — Telephone Encounter (Signed)
Called patient to advise refill sent to pharmacy. °

## 2021-11-21 ENCOUNTER — Telehealth: Payer: Self-pay | Admitting: Cardiovascular Disease

## 2021-11-21 ENCOUNTER — Other Ambulatory Visit: Payer: Self-pay | Admitting: Surgery

## 2021-11-21 ENCOUNTER — Telehealth: Payer: Self-pay

## 2021-11-21 DIAGNOSIS — Z952 Presence of prosthetic heart valve: Secondary | ICD-10-CM

## 2021-11-21 DIAGNOSIS — Z794 Long term (current) use of insulin: Secondary | ICD-10-CM

## 2021-11-21 DIAGNOSIS — E1165 Type 2 diabetes mellitus with hyperglycemia: Secondary | ICD-10-CM

## 2021-11-21 MED ORDER — HYDRALAZINE HCL 100 MG PO TABS: 100.0000 mg | ORAL_TABLET | Freq: Three times a day (TID) | ORAL | 0 refills | Status: DC

## 2021-11-21 MED ORDER — BD PEN NEEDLE MINI U/F 31G X 5 MM MISC: 2 refills | Status: DC

## 2021-11-21 MED ORDER — DOXAZOSIN MESYLATE 2 MG PO TABS: 2.0000 mg | ORAL_TABLET | Freq: Two times a day (BID) | ORAL | 0 refills | Status: DC

## 2021-11-21 NOTE — Telephone Encounter (Signed)
Patient called in needing BD pen needles 32mm x 31G 90 day supply. Sent Rx in to [facility]

## 2021-11-21 NOTE — Telephone Encounter (Signed)
Returned call to pt he states that he needs a refill for 90 days. He has appt scheduled for a little over a month with jennifer, PA. Pt had a 30 day refill from the ER, I will provide another 30 days to make sure that he has enough to make it to his follow up appointment. Rx sent to requested pharmacy.  Also, pt would like to have our coumadin clinic "take over" his coumadin dosing. He states that he will have PCP's office continue to dose until he/they get the ok from [place] that we are going to take over. Pt states that it is ok to wait until appt unless, he can start sooner. Please advise

## 2021-11-21 NOTE — Telephone Encounter (Signed)
Pt c/o medication issue:  1. Name of Medication:  hydrALAZINE (APRESOLINE) 100 MG tablet doxazosin (CARDURA) 2 MG tablet  2. How are you currently taking this medication (dosage and times per day)? Hydralazine 1 tablet every 8 hrs, doxazosin 1 tablet 2x a day  3. Are you having a reaction (difficulty breathing--STAT)? no  4. What is your medication issue? Patient states he has a prescription for the hydralazine 100 mg every 8 hrs. He says it is supposed to be a 90 day supply. He says it also has 0 refills. He states by his calculations it should be 270 tablets for a 90 day supply, but he only received 90 tablets. He would like a new prescription sent with refills. He says he does have the 90 tablets now but needs the additional 180. He also says he is supposed to have doxazosin 2 mg 1 tablet daily, but he never received it. [facility] but him and his daughter who is a pharmacist have searched and did not find it. He would like this medication also sent in for a 90 day supply. He says he would like a call back.

## 2021-11-22 ENCOUNTER — Telehealth: Payer: Self-pay

## 2021-11-22 ENCOUNTER — Encounter: Payer: Self-pay | Admitting: Surgery

## 2021-11-22 ENCOUNTER — Ambulatory Visit
Admission: RE | Admit: 2021-11-22 | Discharge: 2021-11-22 | Disposition: A | Discharge status: Home / Self Care | Payer: 59 | Source: Ambulatory Visit | Attending: Surgery | Admitting: Surgery

## 2021-11-22 ENCOUNTER — Ambulatory Visit (INDEPENDENT_AMBULATORY_CARE_PROVIDER_SITE_OTHER): Payer: Self-pay | Admitting: Surgery

## 2021-11-22 ENCOUNTER — Other Ambulatory Visit: Payer: Self-pay

## 2021-11-22 VITALS — BP 137/73 | HR 80 | Resp 20 | Ht 71.0 in | Wt 268.0 lb

## 2021-11-22 DIAGNOSIS — Z952 Presence of prosthetic heart valve: Secondary | ICD-10-CM

## 2021-11-22 DIAGNOSIS — Z951 Presence of aortocoronary bypass graft: Secondary | ICD-10-CM

## 2021-11-22 NOTE — Telephone Encounter (Signed)
I spoke to the patient and scheduled an INR/New Coumadin to [place] appointment on 2/17 @ 2:30.  He verbalized understanding

## 2021-11-22 NOTE — Progress Notes (Signed)
HPI: Patient returns for routine postoperative follow-up having undergone coronary bypass graft surgery x1 and aortic valve replacement using a 27 mm pericardial valve on 10/05/2021. The patient's early postoperative recovery while in the hospital was notable for worsening of right leg weakness that was present preoperatively.  MRA of the head showed acute to subacute scattered small infarcts in multiple vascular territories including the left cerebellum, left MCA, and bilateral PCA suggesting the possibility of a recent embolic event.  He improved with physical therapy and was walking with a walker.  He was discharged to a skilled nursing facility.  He is now back home with his wife.  She reports that he is not walking very much.  He is using a walker.  He said that he had a fall in his closet yesterday when he reached down to pick up the medication off the floor.  He has been getting some physical therapy at home but his wife does not feel like that is enough.  He denies any chest pain or shortness of breath.    Current Outpatient Medications  Medication Sig Dispense Refill   acetaminophen (TYLENOL) 325 MG tablet Take 2 tablets (650 mg total) by mouth every 4 (four) hours as needed for moderate pain.     Albuterol Sulfate (PROAIR RESPICLICK IN) Inhale 2 puffs into the lungs See admin instructions. Tid x 3 days     ALPRAZolam (XANAX) 0.25 MG tablet Take 1 tablet (0.25 mg total) by mouth See admin instructions. Every 8 hours as needed for anxiety x 2 weeks 5 tablet 0   amiodarone (PACERONE) 200 MG tablet Take 1 tablet (200 mg total) by mouth 2 (two) times daily. 60 tablet 2   amLODipine (NORVASC) 10 MG tablet TAKE 1 TABLET BY MOUTH EVERY DAY (Patient taking differently: Take 10 mg by mouth daily.) 90 tablet 1   amoxicillin-clavulanate (AUGMENTIN) 500-125 MG tablet Take 1 tablet (500 mg total) by mouth 2 (two) times daily for 9 days. 18 tablet 0   belatacept (NULOJIX) 250 MG SOLR injection Inject  24.5 mLs into the vein every 28 (twenty-eight) days.      carvedilol (COREG) 25 MG tablet Take 1 tablet (25 mg total) by mouth 2 (two) times daily with a meal.     cholecalciferol (VITAMIN D3) 25 MCG (1000 UNIT) tablet Take 1,000 Units by mouth in the morning and at bedtime.     cloNIDine (CATAPRES) 0.1 MG tablet Take 0.1 mg by mouth every 8 (eight) hours as needed (SBP >160).     doxazosin (CARDURA) 2 MG tablet Take 1 tablet (2 mg total) by mouth 2 (two) times daily. 60 tablet 0   DULoxetine (CYMBALTA) 60 MG capsule Take 60 mg by mouth daily.     gabapentin (NEURONTIN) 100 MG capsule Take 100 mg by mouth 2 (two) times daily.     hydrALAZINE (APRESOLINE) 100 MG tablet Take 1 tablet (100 mg total) by mouth every 8 (eight) hours. 90 tablet 0   hydrochlorothiazide (MICROZIDE) 12.5 MG capsule Take 12.5 mg by mouth daily.     insulin aspart (NOVOLOG) 100 UNIT/ML injection Inject 0-9 Units into the skin 3 (three) times daily with meals. (Patient taking differently: Inject 0-10 Units into the skin See admin instructions. 3 times daily before meals per sliding scale 0-150 = 0 units 151-199 = 2 units 200-249 = 4 units 250-299 = 6 units 300-349 = 8 units 350-399= 10 units Over 400 = call md) 10 mL 11  insulin glargine (LANTUS) 100 unit/mL SOPN Inject 10 Units into the skin daily. 15 mL 11   Insulin Pen Needle (B-D UF III MINI PEN NEEDLES) 31G X 5 MM MISC Use to inject insulin 4 times a day 300 each 2   levothyroxine (SYNTHROID) 100 MCG tablet TAKE 1 TABLET BY MOUTH EVERY DAY (Patient taking differently: Take 100 mcg by mouth daily before breakfast.) 90 tablet 3   mycophenolate (CELLCEPT) 250 MG capsule Take 1,000 mg by mouth every 12 (twelve) hours.      NON FORMULARY CPAP/BIPAP  at bedtime     predniSONE (DELTASONE) 5 MG tablet Take 5 mg by mouth daily with breakfast. Continuously     rosuvastatin (CRESTOR) 40 MG tablet Take 1 tablet (40 mg total) by mouth daily. 30 tablet 0   senna-docusate  (SENOKOT-S) 8.6-50 MG tablet Take 1 tablet by mouth at bedtime as needed for mild constipation.     tamsulosin (FLOMAX) 0.4 MG CAPS capsule Take 0.8 mg by mouth at bedtime.      testosterone cypionate (DEPOTESTOSTERONE CYPIONATE) 200 MG/ML injection Inject 0.6 mLs (120 mg total) into the muscle every 7 (seven) days. (Patient taking differently: Inject 120 mg into the muscle every Friday.) 10 mL 1   traZODone (DESYREL) 50 MG tablet Take 50 mg by mouth See admin instructions. As needed at bedtime for insomnia x 14 days     warfarin (COUMADIN) 5 MG tablet Take 5 mg by mouth daily at 6 PM.     No current facility-administered medications for this visit.    Physical Exam: BP 137/73 (BP Location: Left Arm, Patient Position: Sitting)    Pulse 80    Resp 20    Ht 5\' 11"  (1.803 m)    Wt 268 lb (121.6 kg)    SpO2 95% Comment: RA   BMI 37.38 kg/m  He is a morbidly obese gentleman in no distress Cardiac exam shows a regular rate and rhythm with normal heart sounds.  There is no murmur. Lungs are clear. The chest incision is healing well and the sternum is stable. There is mild bilateral lower extremity edema that is worse in the right ankle than the left.  Diagnostic Tests:  Narrative & Impression  CLINICAL DATA:  Status post aortic valve replacement   EXAM: CHEST - 2 VIEW   COMPARISON:  11/08/2021   FINDINGS: Postop aortic valve replacement.  Heart size and vascularity normal.   Small bilateral pleural effusions. Negative for infiltrate or edema.   IMPRESSION: Small bilateral pleural effusions.  Negative for edema.     Electronically Signed   By: Franchot Gallo M.D.   On: 11/22/2021 13:46      Impression:  Overall I think he is slowly making progress at 6 weeks following surgery.  He needs to continue walking with a walker is much as possible to build up stamina and muscle tone.  I told him to decrease the amiodarone to 200 mg daily.  I will plan to stop that in about 6 more weeks.   He will need to stay on Coumadin until we are sure that he is not having recurrent atrial fibrillation.  I asked him not to do any heavy lifting of more than 10 pounds for 3 months postoperatively.  Plan:  I will see him back in the middle of March and plan to discontinue the amiodarone at that time if he is maintaining sinus rhythm.   [name], MD [facility] (  [phone] ° °

## 2021-11-22 NOTE — Telephone Encounter (Signed)
Left message for Dr Loren Racer office to call and further discuss pt's INR status.  He will now be checked at our [facility] office.

## 2021-11-23 ENCOUNTER — Other Ambulatory Visit: Payer: Self-pay

## 2021-11-23 ENCOUNTER — Telehealth: Payer: Self-pay

## 2021-11-23 DIAGNOSIS — Z952 Presence of prosthetic heart valve: Secondary | ICD-10-CM

## 2021-11-23 DIAGNOSIS — Z7901 Long term (current) use of anticoagulants: Secondary | ICD-10-CM

## 2021-11-23 NOTE — Telephone Encounter (Signed)
Left Dr Loren Racer office message to fax INR records prior to patient's visit on 2/17

## 2021-11-24 ENCOUNTER — Emergency Department (HOSPITAL_COMMUNITY): Payer: 59

## 2021-11-24 ENCOUNTER — Emergency Department (HOSPITAL_COMMUNITY)
Admission: EM | Admit: 2021-11-24 | Discharge: 2021-11-24 | Disposition: A | Discharge status: Home / Self Care | Payer: 59 | Attending: Emergency Medicine | Admitting: Emergency Medicine

## 2021-11-24 ENCOUNTER — Encounter: Payer: Self-pay | Admitting: Cardiovascular Disease

## 2021-11-24 DIAGNOSIS — E162 Hypoglycemia, unspecified: Secondary | ICD-10-CM | POA: Diagnosis not present

## 2021-11-24 DIAGNOSIS — H538 Other visual disturbances: Secondary | ICD-10-CM

## 2021-11-24 DIAGNOSIS — R531 Weakness: Secondary | ICD-10-CM | POA: Insufficient documentation

## 2021-11-24 DIAGNOSIS — I4891 Unspecified atrial fibrillation: Secondary | ICD-10-CM | POA: Insufficient documentation

## 2021-11-24 DIAGNOSIS — Z7901 Long term (current) use of anticoagulants: Secondary | ICD-10-CM | POA: Insufficient documentation

## 2021-11-24 DIAGNOSIS — Z794 Long term (current) use of insulin: Secondary | ICD-10-CM | POA: Insufficient documentation

## 2021-11-24 LAB — CBC WITH DIFFERENTIAL/PLATELET
Abs Immature Granulocytes: 0.07 10*3/uL (ref 0.00–0.07)
Basophils Absolute: 0.1 10*3/uL (ref 0.0–0.1)
Basophils Relative: 0 %
Eosinophils Absolute: 0.1 10*3/uL (ref 0.0–0.5)
Eosinophils Relative: 1 %
HCT: 38.1 % — ABNORMAL LOW (ref 39.0–52.0)
Hemoglobin: 11.6 g/dL — ABNORMAL LOW (ref 13.0–17.0)
Immature Granulocytes: 1 %
Lymphocytes Relative: 4 %
Lymphs Abs: 0.5 10*3/uL — ABNORMAL LOW (ref 0.7–4.0)
MCH: 27 pg (ref 26.0–34.0)
MCHC: 30.4 g/dL (ref 30.0–36.0)
MCV: 88.6 fL (ref 80.0–100.0)
Monocytes Absolute: 1.3 10*3/uL — ABNORMAL HIGH (ref 0.1–1.0)
Monocytes Relative: 10 %
Neutro Abs: 11.4 10*3/uL — ABNORMAL HIGH (ref 1.7–7.7)
Neutrophils Relative %: 84 %
Platelets: 222 10*3/uL (ref 150–400)
RBC: 4.3 MIL/uL (ref 4.22–5.81)
RDW: 15.5 % (ref 11.5–15.5)
WBC: 13.4 10*3/uL — ABNORMAL HIGH (ref 4.0–10.5)
nRBC: 0 % (ref 0.0–0.2)

## 2021-11-24 LAB — BASIC METABOLIC PANEL
Anion gap: 8 (ref 5–15)
BUN: 53 mg/dL — ABNORMAL HIGH (ref 8–23)
CO2: 22 mmol/L (ref 22–32)
Calcium: 10 mg/dL (ref 8.9–10.3)
Chloride: 109 mmol/L (ref 98–111)
Creatinine, Ser: 2.57 mg/dL — ABNORMAL HIGH (ref 0.61–1.24)
GFR, Estimated: 27 mL/min — ABNORMAL LOW (ref >60)
Glucose, Bld: 62 mg/dL — ABNORMAL LOW (ref 70–99)
Potassium: 4.4 mmol/L (ref 3.5–5.1)
Sodium: 139 mmol/L (ref 135–145)

## 2021-11-24 LAB — HEPATIC FUNCTION PANEL
ALT: 41 U/L (ref 0–44)
AST: 25 U/L (ref 15–41)
Albumin: 3.7 g/dL (ref 3.5–5.0)
Alkaline Phosphatase: 89 U/L (ref 38–126)
Bilirubin, Direct: 0.2 mg/dL (ref 0.0–0.2)
Indirect Bilirubin: 0.5 mg/dL (ref 0.3–0.9)
Total Bilirubin: 0.7 mg/dL (ref 0.3–1.2)
Total Protein: 6.1 g/dL — ABNORMAL LOW (ref 6.5–8.1)

## 2021-11-24 LAB — I-STAT VENOUS BLOOD GAS, ED
Acid-base deficit: 1 mmol/L (ref 0.0–2.0)
Bicarbonate: 22.7 mmol/L (ref 20.0–28.0)
Calcium, Ion: 1.31 mmol/L (ref 1.15–1.40)
HCT: 35 % — ABNORMAL LOW (ref 39.0–52.0)
Hemoglobin: 11.9 g/dL — ABNORMAL LOW (ref 13.0–17.0)
O2 Saturation: 77 %
Potassium: 4.4 mmol/L (ref 3.5–5.1)
Sodium: 139 mmol/L (ref 135–145)
TCO2: 24 mmol/L (ref 22–32)
pCO2, Ven: 34.3 mmHg — ABNORMAL LOW (ref 44–60)
pH, Ven: 7.428 (ref 7.25–7.43)
pO2, Ven: 40 mmHg (ref 32–45)

## 2021-11-24 LAB — PROTIME-INR
INR: 4.9 (ref 0.8–1.2)
Prothrombin Time: 45.7 seconds — ABNORMAL HIGH (ref 11.4–15.2)

## 2021-11-24 LAB — CBG MONITORING, ED
Glucose-Capillary: 61 mg/dL — ABNORMAL LOW (ref 70–99)
Glucose-Capillary: 86 mg/dL (ref 70–99)
Glucose-Capillary: 40 mg/dL — CL (ref 70–99)
Glucose-Capillary: 72 mg/dL (ref 70–99)
Glucose-Capillary: 108 mg/dL — ABNORMAL HIGH (ref 70–99)

## 2021-11-24 LAB — T4, FREE: Free T4: 1.06 ng/dL (ref 0.61–1.12)

## 2021-11-24 LAB — TSH: TSH: 5.174 u[IU]/mL — ABNORMAL HIGH (ref 0.350–4.500)

## 2021-11-24 MED ORDER — GLUCOSE 40 % PO GEL
1.0000 | Freq: Once | ORAL | Status: AC
  Administered 2021-11-24: 31 g via ORAL
  Filled 2021-11-24: qty 1

## 2021-11-24 MED ORDER — DIAZEPAM 5 MG/ML IJ SOLN
2.5000 mg | Freq: Once | INTRAMUSCULAR | Status: AC
  Administered 2021-11-24: 2.5 mg via INTRAVENOUS
  Filled 2021-11-24: qty 2

## 2021-11-24 NOTE — ED Notes (Signed)
MD aware of INR

## 2021-11-24 NOTE — Discharge Instructions (Addendum)
Hold your Coumadin for the next 2 days.  Follow-up in Coumadin clinic on Monday for recheck of your INR.  Follow-up with eye doctor.

## 2021-11-24 NOTE — ED Notes (Addendum)
Pt CBG 40, 3 OJs and soda given to pt. RN notified. Per RN CBG check in 30 mins @ 10:30 first CBG

## 2021-11-24 NOTE — ED Triage Notes (Signed)
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 Hx TIA, ischemic tissue. R sided residual weakness, double vision at baseline. Today, working w OT, getting in shower, 11min episode of intensified double vision, weaker on R side. EMS unable to discern difference from baseline. Pt reports generalized weakness, fatigue, CBG 77. Pt has insulin pump, meds recently changed (Wednesday).  VSS, NAD, EKG unremarkable Fistula on R side 20g L hand

## 2021-11-24 NOTE — ED Notes (Signed)
Patient transported to MRI 

## 2021-11-24 NOTE — ED Provider Notes (Addendum)
[name] EMERGENCY DEPARTMENT Provider Note   CSN: [id number] Arrival date & time: 11/24/21  1502     History  Chief Complaint  Patient presents with   Diplopia   Weakness    Robert [name] is a 61 y.o. male.  Patient with history of atrial fibrillation on Coumadin, history of stroke with right-sided weakness who presents to the ED after weakness episode, blurred vision episode.  He states that since his stroke he is having some vision issues with double vision at times.  He continues to work with occupational therapy.  He was working with them today when he states that he thought maybe his weakness got worse his vision issues got worse but they have now resolved.  He seems to be at his baseline neurologically he thinks.  He denies any chest pain or shortness of breath.  Blood sugar was 77 with EMS.  He has an insulin pump he states.  His heart medications were changed recently as well.  He denies any numbness or new weakness.  Overall he is feeling better.  He states that he was standing at Occupational Therapy when symptoms started.  The history is provided by the patient.  Illness Associated symptoms: no abdominal pain, no chest pain, no congestion, no cough, no diarrhea, no ear pain, no fatigue, no fever, no loss of consciousness, no rash, no rhinorrhea, no shortness of breath, no sore throat and no vomiting       Home Medications Prior to Admission medications   Medication Sig Start Date End Date Taking? Authorizing Provider  acetaminophen (TYLENOL) 325 MG tablet Take 2 tablets (650 mg total) by mouth every 4 (four) hours as needed for moderate pain. 10/15/21   Nani Skillern, PA-C  Albuterol Sulfate (PROAIR RESPICLICK IN) Inhale 2 puffs into the lungs See admin instructions. Tid x 3 days Patient not taking: Reported on 11/22/2021    [provider]  ALPRAZolam Duanne Moron) 0.25 MG tablet Take 1 tablet (0.25 mg total) by mouth See admin  instructions. Every 8 hours as needed for anxiety x 2 weeks 11/13/21   Thurnell Lose, MD  amiodarone (PACERONE) 200 MG tablet Take 1 tablet (200 mg total) by mouth 2 (two) times daily. 11/20/21   Troy Sine, MD  amLODipine (NORVASC) 10 MG tablet TAKE 1 TABLET BY MOUTH EVERY DAY Patient taking differently: Take 10 mg by mouth daily. 09/11/21   Troy Sine, MD  amoxicillin-clavulanate (AUGMENTIN) 500-125 MG tablet Take 1 tablet (500 mg total) by mouth 2 (two) times daily for 9 days. 11/16/21 11/25/21  Little Ishikawa, MD  belatacept (NULOJIX) 250 MG SOLR injection Inject 24.5 mLs into the vein every 28 (twenty-eight) days.  08/30/17   [provider]  carvedilol (COREG) 25 MG tablet Take 1 tablet (25 mg total) by mouth 2 (two) times daily with a meal. 10/26/21   Dahal, Marlowe Aschoff, MD  cholecalciferol (VITAMIN D3) 25 MCG (1000 UNIT) tablet Take 1,000 Units by mouth in the morning and at bedtime.    [provider]  cloNIDine (CATAPRES) 0.1 MG tablet Take 0.1 mg by mouth every 8 (eight) hours as needed (SBP >160).    [provider]  doxazosin (CARDURA) 2 MG tablet Take 1 tablet (2 mg total) by mouth 2 (two) times daily. 11/21/21   Troy Sine, MD  DULoxetine (CYMBALTA) 60 MG capsule Take 60 mg by mouth daily. 07/19/20   [provider]  gabapentin (NEURONTIN) 100 MG  capsule Take 100 mg by mouth 2 (two) times daily. In am and at night 06/02/16   [provider]  hydrALAZINE (APRESOLINE) 100 MG tablet Take 1 tablet (100 mg total) by mouth every 8 (eight) hours. 11/21/21   Troy Sine, MD  hydrochlorothiazide (MICROZIDE) 12.5 MG capsule Take 12.5 mg by mouth daily.    [provider]  insulin aspart (NOVOLOG) 100 UNIT/ML injection Inject 0-9 Units into the skin 3 (three) times daily with meals. Patient taking differently: Inject 0-10 Units into the skin See admin instructions. 3 times daily before meals per sliding scale 0-150 = 0  units 151-199 = 2 units 200-249 = 4 units 250-299 = 6 units 300-349 = 8 units 350-399= 10 units Over 400 = call md 10/18/21   Jadene Pierini E, PA-C  insulin glargine (LANTUS) 100 unit/mL SOPN Inject 10 Units into the skin daily. 10/26/21   Dahal, Marlowe Aschoff, MD  Insulin Pen Needle (B-D UF III MINI PEN NEEDLES) 31G X 5 MM MISC Use to inject insulin 4 times a day 11/21/21   Elayne Snare, MD  levothyroxine (SYNTHROID) 100 MCG tablet TAKE 1 TABLET BY MOUTH EVERY DAY Patient taking differently: Take 100 mcg by mouth daily before breakfast. 06/13/21   Elayne Snare, MD  mycophenolate (CELLCEPT) 250 MG capsule Take 1,000 mg by mouth every 12 (twelve) hours.     [provider]  NON FORMULARY CPAP/BIPAP  at bedtime  BIPAP per pt    [provider]  predniSONE (DELTASONE) 5 MG tablet Take 5 mg by mouth daily with breakfast. Continuously    [provider]  rosuvastatin (CRESTOR) 40 MG tablet Take 1 tablet (40 mg total) by mouth daily. Patient not taking: Reported on 11/22/2021 11/16/21   Little Ishikawa, MD  senna-docusate (SENOKOT-S) 8.6-50 MG tablet Take 1 tablet by mouth at bedtime as needed for mild constipation. 10/26/21   Terrilee Croak, MD  tamsulosin (FLOMAX) 0.4 MG CAPS capsule Take 0.8 mg by mouth at bedtime.     [provider]  testosterone cypionate (DEPOTESTOSTERONE CYPIONATE) 200 MG/ML injection Inject 0.6 mLs (120 mg total) into the muscle every 7 (seven) days. Patient taking differently: Inject 120 mg into the muscle every Friday. 07/10/21   Elayne Snare, MD  traZODone (DESYREL) 50 MG tablet Take 50 mg by mouth See admin instructions. As needed at bedtime for insomnia x 14 days    [provider]  warfarin (COUMADIN) 5 MG tablet Take 5 mg by mouth daily at 6 PM.    [provider]      Allergies    Patient has no known allergies.    Review of Systems   Review of Systems  Constitutional:  Negative for fatigue and fever.  HENT:  Negative for  congestion, ear pain, rhinorrhea and sore throat.   Respiratory:  Negative for cough and shortness of breath.   Cardiovascular:  Negative for chest pain.  Gastrointestinal:  Negative for abdominal pain, diarrhea and vomiting.  Skin:  Negative for rash.  Neurological:  Negative for loss of consciousness.   Physical Exam Updated Vital Signs BP 131/67 (BP Location: Left Arm)    Pulse 60    Temp (!) 97.5 F (36.4 C) (Oral)    Resp 17 Comment: Simultaneous filing. User may not have seen previous data.   Ht 5\' 11"  (1.803 m)    Wt 120.2 kg    SpO2 94%    BMI 36.96 kg/m  Physical Exam Vitals and  nursing note reviewed.  Constitutional:      General: He is not in acute distress.    Appearance: He is well-developed. He is not ill-appearing.  HENT:     Head: Normocephalic and atraumatic.     Right Ear: Tympanic membrane normal.     Left Ear: Tympanic membrane normal.     Nose: Nose normal.     Mouth/Throat:     Mouth: Mucous membranes are moist.  Eyes:     Extraocular Movements: Extraocular movements intact.     Conjunctiva/sclera: Conjunctivae normal.     Pupils: Pupils are equal, round, and reactive to light.     Comments: Extraocular movements are intact, normal visual fields, when testing for double vision sometimes hes 4 fingers versus 2 fingers not all the time  Cardiovascular:     Rate and Rhythm: Normal rate and regular rhythm.     Heart sounds: No murmur heard. Pulmonary:     Effort: Pulmonary effort is normal. No respiratory distress.     Breath sounds: Normal breath sounds.  Abdominal:     General: There is no distension.     Palpations: Abdomen is soft.     Tenderness: There is no abdominal tenderness.  Musculoskeletal:        General: No swelling.     Cervical back: Normal range of motion and neck supple.  Skin:    General: Skin is warm and dry.     Capillary Refill: Capillary refill takes less than 2 seconds.  Neurological:     General: No focal deficit present.      Mental Status: He is alert and oriented to person, place, and time.     Cranial Nerves: No cranial nerve deficit.     Comments: Trace weakness in his right upper extremity, 4+ out of 5 weakness in the right lower extremity but otherwise strength is intact, sensation unremarkable, normal visual fields, no nystagmus  Psychiatric:        Mood and Affect: Mood normal.    ED Results / Procedures / Treatments   Labs (all labs ordered are listed, but only abnormal results are displayed) Labs Reviewed  CBC WITH DIFFERENTIAL/PLATELET - Abnormal; Notable for the following components:      Result Value   WBC 13.4 (*)    Hemoglobin 11.6 (*)    HCT 38.1 (*)    Neutro Abs 11.4 (*)    Lymphs Abs 0.5 (*)    Monocytes Absolute 1.3 (*)    All other components within normal limits  BASIC METABOLIC PANEL - Abnormal; Notable for the following components:   Glucose, Bld 62 (*)    BUN 53 (*)    Creatinine, Ser 2.57 (*)    GFR, Estimated 27 (*)    All other components within normal limits  HEPATIC FUNCTION PANEL - Abnormal; Notable for the following components:   Total Protein 6.1 (*)    All other components within normal limits  PROTIME-INR - Abnormal; Notable for the following components:   Prothrombin Time 45.7 (*)    INR 4.9 (*)    All other components within normal limits  TSH - Abnormal; Notable for the following components:   TSH 5.174 (*)    All other components within normal limits  CBG MONITORING, ED - Abnormal; Notable for the following components:   Glucose-Capillary 61 (*)    All other components within normal limits  I-STAT VENOUS BLOOD GAS, ED - Abnormal; Notable for the following components:  pCO2, Ven 34.3 (*)    HCT 35.0 (*)    Hemoglobin 11.9 (*)    All other components within normal limits  T4, FREE  CBG MONITORING, ED  CBG MONITORING, ED    EKG EKG Interpretation  Date/Time:  Friday November 24 2021 15:34:40 EST Ventricular Rate:  59 PR Interval:  203 QRS  Duration: 109 QT Interval:  460 QTC Calculation: 456 R Axis:   9 Text Interpretation: Sinus rhythm Nonspecific T abnormalities, lateral leads Confirmed by Lennice Sites ([id number]) on 11/24/2021 4:16:58 PM  Radiology CT HEAD WO CONTRAST (5MM)  Result Date: 11/24/2021 CLINICAL DATA:  Provided history: Neuro deficit, acute, stroke suspected. Additional history provided: Prior strokes in December 2022 with residual right-sided weakness and double vision. Patient reports a 5 minute episode earlier today with worsened double vision and increased weakness on the right side. Patient also reports generalized weakness and fatigue today. EXAM: CT HEAD WITHOUT CONTRAST TECHNIQUE: Contiguous axial images were obtained from the base of the skull through the vertex without intravenous contrast. RADIATION DOSE REDUCTION: This exam was performed according to the departmental dose-optimization program which includes automated exposure control, adjustment of the mA and/or kV according to patient size and/or use of iterative reconstruction technique. COMPARISON:  Prior head CT examinations 10/21/2021 and earlier. Brain MRI 10/11/2021. FINDINGS: Brain: Cerebral volume appears normal for age. Two known small chronic infarcts within the posterior left frontal lobe white matter. Additional small chronic infarcts within the bilateral cerebral hemispheres were better appreciated on the prior brain MRI of 10/11/2021 (acute at time of this prior examination). Background mild patchy and ill-defined hypoattenuation within the cerebral white matter, nonspecific but compatible with chronic small vessel ischemic disease. Redemonstrated known chronic infarct within the left caudate head and adjacent white matter. There is no acute intracranial hemorrhage. No acute demarcated cortical infarct. No extra-axial fluid collection. No evidence of an intracranial mass. No midline shift. Vascular: No hyperdense vessel.  Atherosclerotic calcifications.  Skull: Normal. Negative for fracture or focal lesion. Sinuses/Orbits: Visualized orbits show no acute finding. Bilateral proptosis. Minimal mucosal thickening scattered within the bilateral ethmoid sinuses. IMPRESSION: No evidence of acute intracranial abnormality. Two known small chronic infarcts within the posterior left frontal lobe white matter. Additional known small chronic infarcts within the bilateral cerebral hemispheres were better appreciated on the brain MRI of 10/11/2021 (acute at time of this prior exam). Background mild cerebral white matter chronic small vessel ischemic disease. Redemonstrated chronic lacunar infarct within the left caudate head and adjacent white matter. Minimal bilateral ethmoid sinus mucosal thickening. Bilateral proptosis. Electronically Signed   By: Kellie Simmering D.O.   On: 11/24/2021 16:11   MR BRAIN WO CONTRAST  Result Date: 11/24/2021 CLINICAL DATA:  Initial evaluation for neuro deficit, stroke suspected. Recent stroke. EXAM: MRI HEAD WITHOUT CONTRAST TECHNIQUE: Multiplanar, multiecho pulse sequences of the brain and surrounding structures were obtained without intravenous contrast. COMPARISON:  Comparison made with prior head CT from earlier the same day as well as recent MRI from 10/11/2021. FINDINGS: Brain: Cerebral volume stable, and within normal limits for age. Mild scattered patchy T2/FLAIR hyperintensity involving the periventricular and deep white matter both cerebral hemispheres, most consistent with chronic small vessel ischemic disease, mild for age. There has been interval evolution of recently identified scattered small volume ischemic infarcts. Residual diffusion signal abnormality seen about 1 of these prior foci of ischemia at the posterior left frontal centrum semi ovale (series 5, image 86). Small amount of associated petechial blood products at  the left temporal occipital region noted as well (series 14, image 28). No other new foci of restricted  diffusion to suggest acute or interval infarction. Gray-white matter differentiation otherwise maintained. No other evidence for acute or chronic intracranial hemorrhage. No mass lesion, midline shift or mass effect. No hydrocephalus or extra-axial fluid collection. Pituitary gland and suprasellar region normal. Midline structures intact and normal. Vascular: Major intracranial vascular flow voids are maintained. Skull and upper cervical spine: Craniocervical junction within normal limits. Bone marrow signal intensity normal. No scalp soft tissue abnormality. Sinuses/Orbits: Globes and orbital soft tissues within normal limits. Mild scattered mucosal thickening noted throughout the paranasal sinuses. No air-fluid levels to suggest acute sinusitis. No mastoid effusion. Inner ear structures grossly normal. Other: None. IMPRESSION: 1. Normal expected interval evolution of recently identified scattered small volume ischemic infarcts, with a small amount of residual diffusion signal abnormality at the left posterior frontal centrum semi ovale. 2. No other acute intracranial abnormality. No evidence for new or interval infarction. 3. Underlying mild chronic microvascular ischemic disease for age. Electronically Signed   By: Jeannine Boga M.D.   On: 11/24/2021 20:43   DG Chest Portable 1 View  Result Date: 11/24/2021 CLINICAL DATA:  Generalized weakness, fatigue EXAM: PORTABLE CHEST 1 VIEW COMPARISON:  Previous studies including the examination of 11/22/2021 FINDINGS: Transverse diameter of heart is increased. Metallic sutures are seen in the sternum. There are no signs of pulmonary edema. There is blunting of right lateral CP angle. There is pleural density in the lateral aspect of left lower lung fields. Small new linear densities seen in the right lower lung fields IMPRESSION: Cardiomegaly. Blunting of lateral CP angles may suggest pleural thickening and possibly small effusions. There are no signs of  alveolar pulmonary edema. New small patchy infiltrates in the right lower lung fields may suggest subsegmental atelectasis/pneumonia. Electronically Signed   By: Elmer Picker M.D.   On: 11/24/2021 15:45    Procedures Procedures    Medications Ordered in ED Medications  diazepam (VALIUM) injection 2.5 mg (2.5 mg Intravenous Given 11/24/21 1943)    ED Course/ Medical Decision Making/ A&P                           Medical Decision Making Amount and/or Complexity of Data Reviewed Labs: ordered. Radiology: ordered.  Risk OTC drugs. Prescription drug management.   Robert [name] is here after episode of blurred vision, weakness.  Unremarkable vitals.  EKG per my review and interpretation shows sinus rhythm.  No ischemic changes.  Patient had bypass surgery recently and stroke recently.  He is on Coumadin.  He has been working with occupational therapy.  Sounds like since his stroke he has had some blurred vision and residual right-sided weakness.  Seems like symptoms intensified today but now have resolved.  He has not seen an eye doctor.  He denies any chest pain or shortness of breath.  His blood sugar with EMS was only 7.  Upon arrival here 32.  He has not had anything for lunch.  Neurologically he appears to be at his baseline.  His vision appears to be okay but I have checked several times and sometimes he feels like he can have double vision in both eyes and sometimes he does not feel like he does not.  He does not have any visual field deficit.  Overall given his prior history concern for possible stroke.  We will pursue CBC, BMP, TSH,  CT head, MRI head.  Upon his chart review appears that his stroke would likely affect vision in his right lower legs.  However will pursue this work-up.  I have lower suspicion for acute coronary syndrome, blood clot.  Lab work has been reviewed and interpreted.  There is no significant anemia, electrolyte abnormality, kidney injury.  Blood sugar 86.   Free T4 is normal.  Creatinine is at baseline.  INR supratherapeutic at 4.9.  CT scan of the head per my review and interpretation shows no head bleed.  MRI per radiology report shows no acute findings. Jusr sequelae of his recent stroke.  Chest x-ray with no evidence of infection per my review and interpretation.  I did talk on the phone with Dr. Lorrin Goodell with neurology who states that vision issues are likely sequela of his recent stroke but did recommend that he follow-up with an ophthalmologist as well.  Patient will hold his Coumadin for 2 days and then follow-up with INR clinic.    Prior to discharge patient had his blood sugar rechecked and was 45.  Patient has had his insulin device on throughout his stay here, at first I thought device on his abdomen was just the continuous glucose monitor but he states that it is a type of insulin pump, he has only had a cup of orange juice since being here and has not had any food since over 12 hours ago.  At this time we will allow him to eat and drink.  His mental status is normal.  We will repeat some blood sugars after he has significant p.o. intake.  He has removed insulin pump.  Patient is adamant that he can manage this and does not want to stay much longer.  But he is amenable to staying for repeat check of his blood sugar here soon and then we will check again in about 45 minutes to an hour.  Repeat sugar shortly after some PO, improved to 72.   Blood sugar checked checked again and continues to stay stable at 108.  Patient would like to be discharged at this time which I think is reasonable.  He has removed his insulin pump.  We will continue to monitor sugar at home.  He feels comfortable with this.  Discharged in good condition.  This chart was dictated using voice recognition software.  Despite best efforts to proofread,  errors can occur which can change the documentation meaning.    Final Clinical Impression(s) / ED Diagnoses Final diagnoses:   Blurred vision  Weakness  Hypoglycemia    Rx / DC Orders ED Discharge Orders     None         Lennice Sites, DO 11/24/21 2142    Lennice Sites, DO 11/24/21 2159    Lennice Sites, DO 11/24/21 2228    Lennice Sites, DO 11/24/21 2300

## 2021-11-27 ENCOUNTER — Ambulatory Visit (HOSPITAL_COMMUNITY): Payer: 59 | Attending: Cardiovascular Disease

## 2021-11-27 ENCOUNTER — Ambulatory Visit: Payer: 59 | Admitting: Podiatry

## 2021-11-27 ENCOUNTER — Other Ambulatory Visit: Payer: Self-pay

## 2021-11-27 DIAGNOSIS — I350 Nonrheumatic aortic (valve) stenosis: Secondary | ICD-10-CM | POA: Insufficient documentation

## 2021-11-27 LAB — ECHOCARDIOGRAM COMPLETE
AR max vel: 2.01 cm2
AV Area VTI: 2.04 cm2
AV Area mean vel: 1.88 cm2
AV Mean grad: 11 mmHg
AV Peak grad: 19.4 mmHg
Ao pk vel: 2.2 m/s
Area-P 1/2: 3.28 cm2
S' Lateral: 2.6 cm

## 2021-11-28 ENCOUNTER — Other Ambulatory Visit: Payer: Self-pay

## 2021-11-28 ENCOUNTER — Encounter: Payer: Self-pay | Admitting: Endocrinology

## 2021-11-28 ENCOUNTER — Ambulatory Visit: Payer: 59 | Admitting: Endocrinology

## 2021-11-28 VITALS — BP 128/60 | HR 68 | Ht 71.0 in | Wt 277.6 lb

## 2021-11-28 DIAGNOSIS — Z794 Long term (current) use of insulin: Secondary | ICD-10-CM

## 2021-11-28 DIAGNOSIS — E1165 Type 2 diabetes mellitus with hyperglycemia: Secondary | ICD-10-CM | POA: Diagnosis not present

## 2021-11-28 DIAGNOSIS — E039 Hypothyroidism, unspecified: Secondary | ICD-10-CM

## 2021-11-28 DIAGNOSIS — E291 Testicular hypofunction: Secondary | ICD-10-CM | POA: Diagnosis not present

## 2021-11-28 LAB — TESTOSTERONE: Testosterone: 641.61 ng/dL (ref 300.00–890.00)

## 2021-11-28 MED ORDER — HUMULIN R U-500 (CONCENTRATED) 500 UNIT/ML ~~LOC~~ SOLN: SUBCUTANEOUS | 1 refills | Status: DC

## 2021-11-28 NOTE — Progress Notes (Signed)
Since testosterone is now upper normal will need to decrease dosage to 0.5 mL instead of 0.6 mL every week

## 2021-11-28 NOTE — Progress Notes (Signed)
Patient ID: Robert [name], male   DOB: [birth year], 61 y.o.   MRN: [id number]     Reason for Appointment: Endocrinology follow-up  History of Present Illness   DIABETES diagnosis date:  2012  Previous history: His blood sugar has been difficult to control since onset. Initially was taking oral hypoglycemic drugs like glyburide but could not take metformin because of renal dysfunction. Since Victoza did not help his control he was started on Lantus and then U 500 insulin also His A1c has previously ranged from 7.8-9.8, the lowest level in 03/2013 He was on Victoza but because of his markedly decreased appetite and weight loss this was stopped in 9/15  Recent history:   Non-hypoglycemic insulin regimen: was on Victoza 1.8 mg daily  His A1c is last 7.1 Fructosamine is relatively higher also at 296  OMNIPOD pump settings with U-500 insulin:  Basal rate 12 AM = 0.2, 5 AM 12 noon = 0.7, 12 PM-10 PM = = 0.7 and 10 PM = 0.4  Carbohydrate ratio 1:10, correction factor I: 60  Bolus insulin with NovoLog FlexPen variable doses 10-15, currently none  Current blood sugar patterns, management and problems identified: He was last seen in 10/22  He appears to be using his OmniPod insulin pump intermittently and mostly in the last 10 days  However he does not appear to be giving any boluses except occasionally when the blood sugar goes up  Overall he thinks that he is changing his diet and getting more salads to low carbohydrate meals  Overall he has lost weight especially since his cardiac surgery last year  he says that he is now able to afford the freestyle libre system which he started 3 days ago Otherwise blood sugar data is only minimal for the last couple of weeks The last 2 nights he appears to be getting relatively low blood sugars although only mild hypoglycemia early yesterday morning around 6-7 AM POSTPRANDIAL readings appear to be higher after breakfast including today  when he had a sandwich Again today despite eating carbohydrates he did not bolus until blood sugar went up previously had stopped Victoza which he thought was causing constipation   Proper timing of medications in relation to meals: Yes.          Blood sugar data from freestyle libre:  Average blood sugar on 2/19 = 159 Average on 2/20 was 111 over 24 hours          Previous data: Blood Glucose readings from insulin pump monitoring frequently with recently good fasting readings, previously some high readings both morning and couple of high readings late evening AVERAGE 190 with MORNING average 177   Meals: 2-3 meals per day. 7-8 am and Noon and Dinner 5-7 PM .              Weight control:   Wt Readings from Last 3 Encounters:  11/28/21 277 lb 9.6 oz (125.9 kg)  11/24/21 265 lb (120.2 kg)  11/22/21 268 lb (737.1 kg)        Complications:   nephropathy, neuropathy   Diabetes labs:   Lab Results  Component Value Date   HGBA1C 7.1 (H) 10/03/2021   HGBA1C 8.1 (A) 07/10/2021   HGBA1C 7.5 (H) 03/23/2021   Lab Results  Component Value Date   MICROALBUR 146.3 (H) 08/03/2019   LDLCALC 65 10/13/2021   CREATININE 2.57 (H) 11/24/2021    Lab Results  Component Value Date   FRUCTOSAMINE 296 (H) 07/04/2021  FRUCTOSAMINE 276 12/13/2020   FRUCTOSAMINE 375 (H) 03/31/2020   Lab Results  Component Value Date   HGB 11.9 (L) 11/24/2021       OTHER problems: See review of systems    Allergies as of 11/28/2021   No Known Allergies      Medication List        Accurate as of November 28, 2021 10:03 AM. If you have any questions, ask your nurse or doctor.          acetaminophen 325 MG tablet Commonly known as: TYLENOL Take 2 tablets (650 mg total) by mouth every 4 (four) hours as needed for moderate pain.   ALPRAZolam 0.25 MG tablet Commonly known as: XANAX Take 1 tablet (0.25 mg total) by mouth See admin instructions. Every 8 hours as needed for anxiety x 2  weeks What changed: additional instructions   amiodarone 200 MG tablet Commonly known as: PACERONE Take 1 tablet (200 mg total) by mouth 2 (two) times daily.   amLODipine 10 MG tablet Commonly known as: NORVASC TAKE 1 TABLET BY MOUTH EVERY DAY   B-D UF III MINI PEN NEEDLES 31G X 5 MM Misc Generic drug: Insulin Pen Needle Use to inject insulin 4 times a day   carvedilol 25 MG tablet Commonly known as: COREG Take 1 tablet (25 mg total) by mouth 2 (two) times daily with a meal.   cholecalciferol 25 MCG (1000 UNIT) tablet Commonly known as: VITAMIN D3 Take 1,000 Units by mouth in the morning and at bedtime.   cloNIDine 0.1 MG tablet Commonly known as: CATAPRES Take 0.1 mg by mouth every 8 (eight) hours as needed (SBP >160).   doxazosin 2 MG tablet Commonly known as: CARDURA Take 1 tablet (2 mg total) by mouth 2 (two) times daily.   DULoxetine 60 MG capsule Commonly known as: CYMBALTA Take 60 mg by mouth daily.   gabapentin 100 MG capsule Commonly known as: NEURONTIN Take 100 mg by mouth 2 (two) times daily. In am and at night   hydrALAZINE 100 MG tablet Commonly known as: APRESOLINE Take 1 tablet (100 mg total) by mouth every 8 (eight) hours.   hydrochlorothiazide 12.5 MG capsule Commonly known as: MICROZIDE Take 12.5 mg by mouth daily.   insulin aspart 100 UNIT/ML injection Commonly known as: novoLOG Inject 0-9 Units into the skin 3 (three) times daily with meals. What changed:  how much to take when to take this additional instructions   insulin glargine 100 unit/mL Sopn Commonly known as: LANTUS Inject 10 Units into the skin daily.   levothyroxine 100 MCG tablet Commonly known as: SYNTHROID TAKE 1 TABLET BY MOUTH EVERY DAY What changed: when to take this   mycophenolate 250 MG capsule Commonly known as: CELLCEPT Take 1,000 mg by mouth every 12 (twelve) hours.   NON FORMULARY CPAP/BIPAP  at bedtime  BIPAP per pt   Nulojix 250 MG Solr  injection Generic drug: belatacept Inject 24.5 mLs into the vein every 28 (twenty-eight) days.   predniSONE 5 MG tablet Commonly known as: DELTASONE Take 5 mg by mouth daily with breakfast. Continuously   PROAIR RESPICLICK IN Inhale 2 puffs into the lungs See admin instructions. Tid x 3 days   rosuvastatin 40 MG tablet Commonly known as: CRESTOR Take 1 tablet (40 mg total) by mouth daily.   senna-docusate 8.6-50 MG tablet Commonly known as: Senokot-S Take 1 tablet by mouth at bedtime as needed for mild constipation.   tamsulosin 0.4 MG Caps capsule  Commonly known as: FLOMAX Take 0.8 mg by mouth at bedtime.   testosterone cypionate 200 MG/ML injection Commonly known as: DEPOTESTOSTERONE CYPIONATE Inject 0.6 mLs (120 mg total) into the muscle every 7 (seven) days. What changed: when to take this   traZODone 50 MG tablet Commonly known as: DESYREL Take 50 mg by mouth See admin instructions. As needed at bedtime for insomnia x 14 days   warfarin 5 MG tablet Commonly known as: COUMADIN Take 5 mg by mouth daily at 6 PM.        Allergies: No Known Allergies  Past Medical History:  Diagnosis Date   Anemia    low hgb at present   Aortic valve stenosis    Arthritis    FEET   Blood transfusion without reported diagnosis 1974   kidney removed - L   Cancer ([place])    skin ca right shoulder, plastic dsyplasia, pre-Ca polpys removed on Colonoscopy- 07/2014   Colon polyps 07/23/2014   Tubular adenomas x 5   Constipation    Coronary artery disease    Diabetes mellitus without complication ([place])    Type 2   Difficult intubation    unsure of actual problem but it was during the January 28, 2015 procedure.   Dysrhythmia    Eczema    ESRD (end stage renal disease) ([place])    hemodialysis 06/2014-02/2017, s/p living unrelated kidney transplant 02/26/17   GI bleed 07/26/2017   Headache(784.0)    slight dull h/a due kidney failure   Heart murmur    aortic stenosis (moderate-severe  04/2021)   Hyperlipidemia    Hypertension    SVT h/o , followed by Dr. Claiborne Billings   Hypertensive urgency 10/21/2021   Hypothyroidism    Macular degeneration    Neuropathy    right hand and both feet   PAT (paroxysmal atrial tachycardia) ([place])    Peritoneal dialysis catheter in place [facility]) 02/24/2015   PSVT (paroxysmal supraventricular tachycardia) ([facility])    SBO (small bowel obstruction) ([place]) 05/2015   Shortness of breath    with exertion   Sleep apnea 06/18/2011   split-night sleep study- [facility]., BiPAP- 13-16   Thyroid disease    Umbilical hernia    s/p repair 04/28/15    Past Surgical History:  Procedure Laterality Date   AORTIC VALVE REPLACEMENT N/A 10/05/2021   Procedure: AORTIC VALVE REPLACEMENT (AVR) WITH INSPIRIS RESILIA AORTIC VALVE SIZE 27MM;  Surgeon: [name], MD;  Location: [place];  Service: Open Heart Surgery;  Laterality: N/A;   AV FISTULA PLACEMENT Right 02/25/2015   Procedure: RIGHT ARTERIOVENOUS (AV) FISTULA CREATION;  Surgeon: Serafina Mitchell, MD;  Location: [place];  Service: Vascular;  Laterality: Right;   AV FISTULA PLACEMENT Right 09/15/2015   Procedure: ARTERIOVENOUS (AV) FISTULA CREATION- RIGHT ARM;  Surgeon: Serafina Mitchell, MD;  Location: [place];  Service: Vascular;  Laterality: Right;   CAPD INSERTION N/A 05/18/2014   Procedure: LAPAROSCOPIC INSERTION CONTINUOUS AMBULATORY PERITONEAL DIALYSIS  (CAPD) CATHETER;  Surgeon: Ralene Ok, MD;  Location: [place];  Service: General;  Laterality: N/A;   COLONOSCOPY     COLONOSCOPY WITH PROPOFOL N/A 07/12/2016   Procedure: COLONOSCOPY WITH PROPOFOL;  Surgeon: [name], MD;  [facility]: [facility];  Service: Endoscopy;  Laterality: N/A;   CORONARY ARTERY BYPASS GRAFT N/A 10/05/2021   Procedure: CORONARY ARTERY BYPASS GRAFTING (CABG) X 1, ON PUMP, USING LEFT INTERNAL MAMMARY ARTERY CONDUIT;  Surgeon: [name], MD;  [facility];  Service: Open Heart Surgery;  Laterality: N/A;    declotting of fistula  08/2015   ESOPHAGOGASTRODUODENOSCOPY (EGD) WITH PROPOFOL N/A 07/12/2016   Procedure: ESOPHAGOGASTRODUODENOSCOPY (EGD) WITH PROPOFOL;  Surgeon: [name], MD;  [facility];  Service: Endoscopy;  Laterality: N/A;   FISTULA SUPERFICIALIZATION Right 02/07/2016   Procedure: RIIGHT UPPER ARM FISTULA SUPERFICIALIZATION;  Surgeon: Elam Dutch, MD;  Location: [place];  Service: Vascular;  Laterality: Right;   IJ catheter insertion     INSERTION OF DIALYSIS CATHETER Right 02/25/2015   Procedure: INSERTION OF RIGHT INTERNAL JUGULAR DIALYSIS CATHETER;  Surgeon: Serafina Mitchell, MD;  Location: [facility];  Service: Vascular;  Laterality: Right;   KIDNEY TRANSPLANT     LAPAROSCOPIC REPOSITIONING CAPD CATHETER N/A 06/16/2014   Procedure: LAPAROSCOPIC REPOSITIONING CAPD CATHETER;  Surgeon: Ralene Ok, MD;  Location: [place];  Service: General;  Laterality: N/A;   LAPAROSCOPY N/A 05/04/2015   Procedure: LAPAROSCOPY DIAGNOSTIC LYSIS OF ADHESIONS;  Surgeon: Coralie Keens, MD;  Location: [place];  Service: General;  Laterality: N/A;   MINOR REMOVAL OF PERITONEAL DIALYSIS CATHETER N/A 04/28/2015   Procedure:  REMOVAL OF PERITONEAL DIALYSIS CATHETER;  Surgeon: Coralie Keens, MD;  Location: [place];  Service: General;  Laterality: N/A;   NEPHRECTOMY Left 1974   RENAL BIOPSY Right 2012   REVISON OF ARTERIOVENOUS FISTULA Right 05/26/2015   Procedure: SUPERFICIALIZATION OF ARTERIOVENOUS FISTULA WITH SIDE BRANCH LIGATIONS;  Surgeon: Serafina Mitchell, MD;  [facility];  Service: Vascular;  Laterality: Right;   RIGHT/LEFT HEART CATH AND CORONARY ANGIOGRAPHY N/A 08/22/2021   Procedure: RIGHT/LEFT HEART CATH AND CORONARY ANGIOGRAPHY;  Surgeon: Troy Sine, MD;  Location: [place] [facility];  Service: Cardiovascular;  Laterality: N/A;   SKIN CANCER EXCISION     right shoulder   SVT ABLATION N/A 11/23/2016   Procedure: SVT Ablation;  Surgeon: Will Meredith Leeds, MD;  Location: [place] [facility];  Service: Cardiovascular;  Laterality: N/A;   TEE WITHOUT CARDIOVERSION N/A 10/05/2021   Procedure: TRANSESOPHAGEAL ECHOCARDIOGRAM (TEE);  Surgeon: [name], MD;  Location: [place];  Service: Open Heart Surgery;  Laterality: N/A;   UMBILICAL HERNIA REPAIR N/A 05/18/2014   Procedure: HERNIA REPAIR UMBILICAL ADULT;  Surgeon: Ralene Ok, MD;  Location: [place];  Service: General;  Laterality: N/A;   UMBILICAL HERNIA REPAIR N/A 04/28/2015   Procedure: UMBILICAL HERNIA REPAIR WITH MESH;  Surgeon: Coralie Keens, MD;  Location: [place];  Service: General;  Laterality: N/A;    Family History  Problem Relation Age of Onset   Colon polyps Father    Colon cancer Neg Hx    Stomach cancer Neg Hx     Social History:  reports that he quit smoking about 8 years ago. His smoking use included cigars. He has never used smokeless tobacco. He reports current alcohol use. He reports that he does not use drugs.  Review of Systems:  Hypertension:    His blood pressure has been managed by his nephrologist and transplant team monitors BP at home also  BP Readings from Last 3 Encounters:  11/28/21 128/60  11/24/21 138/66  11/22/21 137/73    Lipids:  His LDL is below 70 with 40 mg Crestor Has no evidence of vascular disease     Lab Results  Component Value Date   CHOL 109 10/13/2021   CHOL 148 07/04/2021   CHOL 147 08/31/2020   Lab Results  Component Value Date  HDL 31 (L) 10/13/2021   HDL 69.70 07/04/2021   HDL 53.40 08/31/2020   Lab Results  Component Value Date   LDLCALC 65 10/13/2021   LDLCALC 67 07/04/2021   LDLCALC 75 08/31/2020   Lab Results  Component Value Date   TRIG 66 10/13/2021   TRIG 54.0 07/04/2021   TRIG 97.0 08/31/2020   Lab Results  Component Value Date   CHOLHDL 3.5 10/13/2021   CHOLHDL 2 07/04/2021   CHOLHDL 3 08/31/2020   No results found for: LDLDIRECT   HYPOTHYROIDISM: He has had long-standing hypothyroidism  Has been taking his  100 g levothyroxine before breakfast daily  TSH recently slightly high in the ER He has taken his levothyroxine consistently  Lab Results  Component Value Date   TSH 5.174 (H) 11/24/2021       HYPOGONADISM: 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  When testosterone level was low he was having fatigue and decreased motivation and because of his  he was prescribed He has been using AndroGel since February 2019 but subsequently stopped this because of cost  Current regimen is Depo testosterone 120 mg weekly He thinks he is taking this regularly  Pretreatment free testosterone level low at 3.7     Lab Results  Component Value Date   TESTOSTERONE 488.06 03/23/2021   TESTOSTERONE 643.62 12/13/2020   TESTOSTERONE 366.67 10/27/2020   Lab Results  Component Value Date   HGB 11.9 (L) 11/24/2021    He has depression, taking 30 mg of Cymbalta; followed by PCP  Diabetic neuropathy: Has mild sensory loss on his last exam Followed by podiatrist    Examination:   BP 128/60    Pulse 68    Ht 5\' 11"  (1.803 m)    Wt 277 lb 9.6 oz (125.9 kg)    SpO2 98%    BMI 38.72 kg/m   Body mass index is 38.72 kg/m.    ASSESSMENT/ PLAN:    Diabetes type 2 with obesity, insulin-requiring:   See history of present illness for detailed discussion of his current management, problems identified and sugar patterns on his meter download  Has been on the OmniPod pump with U-500 recently  A1c is 7.1 as of December   His blood sugar control is mostly better because of improved diet However still appears to be needing boluses for any meals that have carbohydrate Blood sugar data is incomplete and only information available from the freestyle libre for the last 2-1/2 days which show excellent control He likely is getting prolonged action of the Humulin R because of low sugars early morning   Start taking BOLUSES before any meal which has carbohydrate especially  breakfast  He can start with 1 unit per 15 g of carbohydrate and increase further if needed to 1:10 ratio Emphasized the need to bolus 30 minutes before eating  He will not need to take NovoLog or Lantus BASAL rate at midnight = 0.05 and at 7 AM = 0.7 He will take prednisone on waking up rather than at 9 AM and this will match his bolus   Check testosterone level  HYPOTHYROIDISM:  TSH was minimally high in the ER and will need to recheck on the next visit before increasing the dose   There are no Patient Instructions on file for this visit.     Elayne Snare 11/28/2021, 10:03 AM

## 2021-11-28 NOTE — Patient Instructions (Signed)
5 am basal rate 0.7  Bolus 1 unit per 15gm carb, 30 min before meals  Prednisone with thyroid

## 2021-11-29 ENCOUNTER — Ambulatory Visit: Payer: 59

## 2021-11-29 ENCOUNTER — Encounter: Payer: Self-pay | Admitting: Cardiovascular Disease

## 2021-11-29 DIAGNOSIS — I4891 Unspecified atrial fibrillation: Secondary | ICD-10-CM

## 2021-11-29 DIAGNOSIS — Z952 Presence of prosthetic heart valve: Secondary | ICD-10-CM | POA: Diagnosis not present

## 2021-11-29 DIAGNOSIS — Z5181 Encounter for therapeutic drug level monitoring: Secondary | ICD-10-CM

## 2021-11-29 DIAGNOSIS — Z7901 Long term (current) use of anticoagulants: Secondary | ICD-10-CM | POA: Insufficient documentation

## 2021-11-29 HISTORY — DX: Unspecified atrial fibrillation: I48.91

## 2021-11-29 LAB — POCT INR: INR: 1.9 — AB (ref 2.0–3.0)

## 2021-11-29 LAB — FRUCTOSAMINE: Fructosamine: 295 umol/L — ABNORMAL HIGH (ref 0–285)

## 2021-11-29 NOTE — Patient Instructions (Addendum)
TAKE 1.5 TABLETS  TODAY and then Take 1 tablet Daily.  INR in 2 weeks.  [phone]  Things that will raise INR: Antibiotics, Steroids (Prednisone), Cranberries and Alcohol.  Also Diarrhea.  Vitamin K lowers INR.  Boost/Ensure

## 2021-12-01 NOTE — Progress Notes (Signed)
NEUROLOGY CONSULTATION NOTE  ODYN TURKO MRN: [id number] DOB: 30-Oct-1960  Referring provider: Gilford Raid, MD Primary care provider: Domenick Gong, MD  Reason for consult:  post-op stroke  Assessment/Plan:   Post-operative left greater than right bilateral embolic infarcts s/p CABG and AVR Post-operative atrial fibrillation Hypertension Type 2 diabetes mellitus ESRD s/p renal transplant, immunocompromised  He is on short-term disability until June.  He is a English as a second language teacher.  He endorses memory problems since the stroke.  Concern for vascular neurocognitive disorder.  I would like a neuropsychological evaluation prior to any release back to work.  1  Neuropsychological evaluation.  Will schedule for May, shortly before his short-term disability ends.   2  Secondary stroke prevention as managed by cardiology/PCP: - Warfarin - Crestor.  LDL goal less than 70 - Hgb A1c goal less than 7 - Normotensive blood pressure Follow up 6 months.   Subjective:  Robert [name] is a 61 year old left-handed male with CAD, DM II, aortic valve stenosis, ESRD s/p renal transplant with CKD3b, hypothyroidism, HTN, sleep apnea and paroxysmal atrial tachycardia and PSVT who presents for post-operative cerebrovascular accident.  History supplemented by hospital records.  CT/MRI brain and MRA head and neck mentioned below personally reviewed  Following CABG x1 and aortic valve replacement  on 10/05/2021, he noted that he was unable to lift his right leg.  He has history of right lower extremity weakness and pain following renal transplant in 2018 but his leg was now almost plegic.  CT head negative for acute abnormality.  MRI of brain on 10/11/2021 revealed scattered acute and subacute small infarcts in multiple vascular territories including left cerebellar hemisphere, left MCA territory and bilateral PCA territory.  MRA of head and neck demonstrated chronic occluded V4 segment of the right vertebral artery  as well as moderate stenosis of the right ACA A2.  The intra-operative TEE on 12/29 revealed no evidence of thrombus.  He did develop post-operative valvular a fib and was started on warfarin.  To evaluate for possible radiculopathy, he also had MRI of lumbar spine which revealed multilevel degenerative disc and endplate disease with mild spinal and left lateral recess stenosis at L1-2, moderate right neural foraminal stenosis at L3 and bilateral L4 nerve levels and moderate to severe neural foraminal stenosis at the bilateral L5 nerve levels.  He was discharged to SNF.    He was readmitted to the hospital on 10/21/2021 for hypertensive urgency presenting as dizziness and nausea.  Blood pressure was 204/119.  CT head again demonstrated known subacute embolic infarcts but no new acute findings.  He had to be hospitalized again on 11/08/2021 for   pyelonephritis and UTI.    Since the stroke, he has had episode of vertical double vision for several seconds.  He had worsening symptoms on 11/24/2021 while working with OT/PT.  Still has right leg weakness.  Arm is improved.  He has trouble ambulating.  He was seen in the ED where etiology was thought to be due to hypoglycemia.  CT head and follow up MRI of brain showed no acute findings.  Since the stroke, he has trouble remembering events that had occurred around that time. Prior to the surgery, he had been on a daily baby ASA.     PAST MEDICAL HISTORY: Past Medical History:  Diagnosis Date   Anemia    low hgb at present   Aortic valve stenosis    Arthritis    FEET   Blood transfusion without  reported diagnosis 1974   kidney removed - L   Cancer ([place])    skin ca right shoulder, plastic dsyplasia, pre-Ca polpys removed on Colonoscopy- 07/2014   Colon polyps 07/23/2014   Tubular adenomas x 5   Constipation    Coronary artery disease    Diabetes mellitus without complication ([place])    Type 2   Difficult intubation    unsure of actual problem but it was  during the January 28, 2015 procedure.   Dysrhythmia    Eczema    ESRD (end stage renal disease) ([place])    hemodialysis 06/2014-02/2017, s/p living unrelated kidney transplant 02/26/17   GI bleed 07/26/2017   Headache(784.0)    slight dull h/a due kidney failure   Heart murmur    aortic stenosis (moderate-severe 04/2021)   Hyperlipidemia    Hypertension    SVT h/o , followed by Dr. Claiborne Billings   Hypertensive urgency 10/21/2021   Hypothyroidism    Macular degeneration    Neuropathy    right hand and both feet   PAT (paroxysmal atrial tachycardia) ([place])    Peritoneal dialysis catheter in place [facility]) 02/24/2015   PSVT (paroxysmal supraventricular tachycardia) ([facility])    SBO (small bowel obstruction) ([place]) 05/2015   Shortness of breath    with exertion   Sleep apnea 06/18/2011   split-night sleep study- [facility]., BiPAP- 13-16   Thyroid disease    Umbilical hernia    s/p repair 04/28/15    PAST SURGICAL HISTORY: Past Surgical History:  Procedure Laterality Date   AORTIC VALVE REPLACEMENT N/A 10/05/2021   Procedure: AORTIC VALVE REPLACEMENT (AVR) WITH INSPIRIS RESILIA AORTIC VALVE SIZE 27MM;  Surgeon: [name], MD;  Location: [place];  Service: Open Heart Surgery;  Laterality: N/A;   AV FISTULA PLACEMENT Right 02/25/2015   Procedure: RIGHT ARTERIOVENOUS (AV) FISTULA CREATION;  Surgeon: Serafina Mitchell, MD;  Location: [place];  Service: Vascular;  Laterality: Right;   AV FISTULA PLACEMENT Right 09/15/2015   Procedure: ARTERIOVENOUS (AV) FISTULA CREATION- RIGHT ARM;  Surgeon: Serafina Mitchell, MD;  Location: [place];  Service: Vascular;  Laterality: Right;   CAPD INSERTION N/A 05/18/2014   Procedure: LAPAROSCOPIC INSERTION CONTINUOUS AMBULATORY PERITONEAL DIALYSIS  (CAPD) CATHETER;  Surgeon: Ralene Ok, MD;  Location: [place];  Service: General;  Laterality: N/A;   COLONOSCOPY     COLONOSCOPY WITH PROPOFOL N/A 07/12/2016   Procedure: COLONOSCOPY WITH PROPOFOL;  Surgeon:  [name], MD;  [facility];  Service: Endoscopy;  Laterality: N/A;   CORONARY ARTERY BYPASS GRAFT N/A 10/05/2021   Procedure: CORONARY ARTERY BYPASS GRAFTING (CABG) X 1, ON PUMP, USING LEFT INTERNAL MAMMARY ARTERY CONDUIT;  Surgeon: [name], MD;  Location: [place];  Service: Open Heart Surgery;  Laterality: N/A;   declotting of fistula  08/2015   ESOPHAGOGASTRODUODENOSCOPY (EGD) WITH PROPOFOL N/A 07/12/2016   Procedure: ESOPHAGOGASTRODUODENOSCOPY (EGD) WITH PROPOFOL;  Surgeon: [name], MD;  [facility];  Service: Endoscopy;  Laterality: N/A;   FISTULA SUPERFICIALIZATION Right 02/07/2016   Procedure: RIIGHT UPPER ARM FISTULA SUPERFICIALIZATION;  Surgeon: Elam Dutch, MD;  Location: [place];  Service: Vascular;  Laterality: Right;   IJ catheter insertion     INSERTION OF DIALYSIS CATHETER Right 02/25/2015   Procedure: INSERTION OF RIGHT INTERNAL JUGULAR DIALYSIS CATHETER;  Surgeon: Serafina Mitchell, MD;  Location: [place];  Service: Vascular;  Laterality: Right;   [place]  CAPD CATHETER N/A 06/16/2014   Procedure: LAPAROSCOPIC REPOSITIONING CAPD CATHETER;  Surgeon: Ralene Ok, MD;  Location: [place];  Service: General;  Laterality: N/A;   LAPAROSCOPY N/A 05/04/2015   Procedure: LAPAROSCOPY DIAGNOSTIC LYSIS OF ADHESIONS;  Surgeon: Coralie Keens, MD;  Location: [place];  Service: General;  Laterality: N/A;   MINOR REMOVAL OF PERITONEAL DIALYSIS CATHETER N/A 04/28/2015   Procedure:  REMOVAL OF PERITONEAL DIALYSIS CATHETER;  Surgeon: Coralie Keens, MD;  Location: [place];  Service: General;  Laterality: N/A;   NEPHRECTOMY Left 1974   RENAL BIOPSY Right 2012   REVISON OF ARTERIOVENOUS FISTULA Right 05/26/2015   Procedure: SUPERFICIALIZATION OF ARTERIOVENOUS FISTULA WITH SIDE BRANCH LIGATIONS;  Surgeon: Serafina Mitchell, MD;  Location: [place];  Service: Vascular;  Laterality: Right;   RIGHT/LEFT HEART CATH AND CORONARY  ANGIOGRAPHY N/A 08/22/2021   Procedure: RIGHT/LEFT HEART CATH AND CORONARY ANGIOGRAPHY;  Surgeon: Troy Sine, MD;  Location: [place] [facility];  Service: Cardiovascular;  Laterality: N/A;   SKIN CANCER EXCISION     right shoulder   SVT ABLATION N/A 11/23/2016   Procedure: SVT Ablation;  Surgeon: Will Meredith Leeds, MD;  Location: [place] [facility];  Service: Cardiovascular;  Laterality: N/A;   TEE WITHOUT CARDIOVERSION N/A 10/05/2021   Procedure: TRANSESOPHAGEAL ECHOCARDIOGRAM (TEE);  Surgeon: [name], MD;  Location: [place];  Service: Open Heart Surgery;  Laterality: N/A;   UMBILICAL HERNIA REPAIR N/A 05/18/2014   Procedure: HERNIA REPAIR UMBILICAL ADULT;  Surgeon: Ralene Ok, MD;  Location: [place];  Service: General;  Laterality: N/A;   UMBILICAL HERNIA REPAIR N/A 04/28/2015   Procedure: UMBILICAL HERNIA REPAIR WITH MESH;  Surgeon: Coralie Keens, MD;  Location: [place];  Service: General;  Laterality: N/A;    MEDICATIONS: Current Outpatient Medications on File Prior to Visit  Medication Sig Dispense Refill   acetaminophen (TYLENOL) 325 MG tablet Take 2 tablets (650 mg total) by mouth every 4 (four) hours as needed for moderate pain.     Albuterol Sulfate (PROAIR RESPICLICK IN) Inhale 2 puffs into the lungs See admin instructions. Tid x 3 days (Patient not taking: Reported on 11/22/2021)     ALPRAZolam (XANAX) 0.25 MG tablet Take 1 tablet (0.25 mg total) by mouth See admin instructions. Every 8 hours as needed for anxiety x 2 weeks (Patient taking differently: Take 0.25 mg by mouth See admin instructions. Twice a day max as needed for anxiety x 2 weeks) 5 tablet 0   amiodarone (PACERONE) 200 MG tablet Take 1 tablet (200 mg total) by mouth 2 (two) times daily. 60 tablet 2   amLODipine (NORVASC) 10 MG tablet TAKE 1 TABLET BY MOUTH EVERY DAY (Patient taking differently: Take 10 mg by mouth daily.) 90 tablet 1   belatacept (NULOJIX) 250 MG SOLR injection Inject 24.5 mLs into the  vein every 28 (twenty-eight) days.      carvedilol (COREG) 25 MG tablet Take 1 tablet (25 mg total) by mouth 2 (two) times daily with a meal.     cholecalciferol (VITAMIN D3) 25 MCG (1000 UNIT) tablet Take 1,000 Units by mouth in the morning and at bedtime.     cloNIDine (CATAPRES) 0.1 MG tablet Take 0.1 mg by mouth every 8 (eight) hours as needed (SBP >160).     Continuous Blood Gluc Sensor (FREESTYLE LIBRE 3 SENSOR) MISC SMARTSIG:1 Topical Every 2 Weeks     doxazosin (CARDURA) 2 MG tablet Take 1 tablet (2 mg total) by mouth 2 (two)  times daily. 60 tablet 0   DULoxetine (CYMBALTA) 60 MG capsule Take 60 mg by mouth daily.     gabapentin (NEURONTIN) 100 MG capsule Take 100 mg by mouth 2 (two) times daily. In am and at night     HUMULIN R 500 UNIT/ML injection Use max 1.5 ml in pump every three days 60 mL 1   hydrALAZINE (APRESOLINE) 100 MG tablet Take 1 tablet (100 mg total) by mouth every 8 (eight) hours. 90 tablet 0   hydrochlorothiazide (MICROZIDE) 12.5 MG capsule Take 12.5 mg by mouth daily.     Insulin Pen Needle (B-D UF III MINI PEN NEEDLES) 31G X 5 MM MISC Use to inject insulin 4 times a day 300 each 2   levothyroxine (SYNTHROID) 100 MCG tablet TAKE 1 TABLET BY MOUTH EVERY DAY (Patient taking differently: Take 100 mcg by mouth daily before breakfast.) 90 tablet 3   mycophenolate (CELLCEPT) 250 MG capsule Take 1,000 mg by mouth every 12 (twelve) hours.      NON FORMULARY CPAP/BIPAP  at bedtime  BIPAP per pt     predniSONE (DELTASONE) 5 MG tablet Take 5 mg by mouth daily with breakfast. Continuously     rosuvastatin (CRESTOR) 40 MG tablet Take 1 tablet (40 mg total) by mouth daily. 30 tablet 0   senna-docusate (SENOKOT-S) 8.6-50 MG tablet Take 1 tablet by mouth at bedtime as needed for mild constipation.     tamsulosin (FLOMAX) 0.4 MG CAPS capsule Take 0.8 mg by mouth at bedtime.      testosterone cypionate (DEPOTESTOSTERONE CYPIONATE) 200 MG/ML injection Inject 0.6 mLs (120 mg total) into  the muscle every 7 (seven) days. (Patient taking differently: Inject 120 mg into the muscle every Friday.) 10 mL 1   traZODone (DESYREL) 50 MG tablet Take 50 mg by mouth See admin instructions. As needed at bedtime for insomnia x 14 days     warfarin (COUMADIN) 5 MG tablet Take 5 mg by mouth daily at 6 PM.     No current facility-administered medications on file prior to visit.    ALLERGIES: No Known Allergies  FAMILY HISTORY: Family History  Problem Relation Age of Onset   Colon polyps Father    Colon cancer Neg Hx    Stomach cancer Neg Hx     Objective:  Blood pressure 120/60, pulse 79, height 5\' 11"  (1.803 m), weight 280 lb (127 kg), SpO2 96 %. General: No acute distress.  Patient appears well-groomed.   Head:  Normocephalic/atraumatic Eyes:  fundi examined but not visualized Neck: supple, no paraspinal tenderness, full range of motion Back: No paraspinal tenderness Heart: regular rate and rhythm Lungs: Clear to auscultation bilaterally. Vascular: No carotid bruits. Neurological Exam: Mental status: alert and oriented to person, place, and time, speech fluent and not dysarthric, language intact. Cranial nerves: CN I: not tested CN II: pupils equal, round and reactive to light, visual fields intact CN III, IV, VI:  full range of motion, no nystagmus, no ptosis CN V: facial sensation intact. CN VII: upper and lower face symmetric CN VIII: hearing intact CN IX, X: gag intact, uvula midline CN XI: sternocleidomastoid and trapezius muscles intact CN XII: tongue midline Bulk & Tone: normal, no fasciculations. Motor:  muscle strength 5-/5 throughout Sensation:  Pinprick, temperature and vibratory sensation reduced in feet. Deep Tendon Reflexes:  2+ throughout,  toes downgoing.   Finger to nose testing:  Without dysmetria.   Heel to shin:  Without dysmetria.   Gait:  Requires  upper body to push up out of chair.  Broad-based unsteady gait.  Unable to ambulate independently  without walker.     Thank you for allowing me to take part in the care of this patient.  Metta Clines, DO  CC: Domenick Gong, MD

## 2021-12-04 ENCOUNTER — Other Ambulatory Visit: Payer: Self-pay

## 2021-12-04 ENCOUNTER — Ambulatory Visit: Payer: 59 | Admitting: Neurology

## 2021-12-04 VITALS — BP 120/60 | HR 79 | Ht 71.0 in | Wt 280.0 lb

## 2021-12-04 DIAGNOSIS — R413 Other amnesia: Secondary | ICD-10-CM

## 2021-12-04 DIAGNOSIS — I639 Cerebral infarction, unspecified: Secondary | ICD-10-CM | POA: Diagnosis not present

## 2021-12-04 NOTE — Patient Instructions (Signed)
Continue warfarin, statin, diabetic medications, blood pressure medications Will schedule you for neuropsychological evaluation Follow up after testing

## 2021-12-05 ENCOUNTER — Encounter: Payer: Self-pay | Admitting: Neurology

## 2021-12-11 ENCOUNTER — Ambulatory Visit (INDEPENDENT_AMBULATORY_CARE_PROVIDER_SITE_OTHER): Payer: 59 | Admitting: Podiatry

## 2021-12-11 ENCOUNTER — Other Ambulatory Visit: Payer: Self-pay

## 2021-12-11 ENCOUNTER — Encounter: Payer: Self-pay | Admitting: Podiatry

## 2021-12-11 DIAGNOSIS — N189 Chronic kidney disease, unspecified: Secondary | ICD-10-CM

## 2021-12-11 DIAGNOSIS — E1149 Type 2 diabetes mellitus with other diabetic neurological complication: Secondary | ICD-10-CM

## 2021-12-11 DIAGNOSIS — B351 Tinea unguium: Secondary | ICD-10-CM

## 2021-12-11 DIAGNOSIS — M79675 Pain in left toe(s): Secondary | ICD-10-CM | POA: Diagnosis not present

## 2021-12-11 DIAGNOSIS — M79674 Pain in right toe(s): Secondary | ICD-10-CM | POA: Diagnosis not present

## 2021-12-11 NOTE — Progress Notes (Signed)
This patient returns to my office for at risk foot care.  This patient requires this care by a professional since this patient will be at risk due to having uncontrolled diabetes and kidney transplant.  This patient is unable to cut nails himself since the patient cannot reach his nails.These nails are painful walking and wearing shoes.  Resolved heel fissure.   This patient presents for at risk foot care today.  General Appearance  Alert, conversant and in no acute stress.  Vascular  Dorsalis pedis and posterior tibial  pulses are palpable  bilaterally.  Capillary return is within normal limits  bilaterally. Temperature is within normal limits  bilaterally.  Neurologic  Senn-Weinstein monofilament wire test diminished   bilaterally. Muscle power within normal limits bilaterally.  Nails Thick disfigured discolored nails with subungual debris  from hallux to fifth toes bilaterally. No evidence of bacterial infection or drainage bilaterally.  Orthopedic  No limitations of motion  feet .  No crepitus or effusions noted.  No bony pathology or digital deformities noted. HAV  B/L.  Skin  normotropic skin with no porokeratosis noted bilaterally.  No signs of infections or ulcers noted.     Onychomycosis  Pain in right toes  Pain in left toes   Consent was obtained for treatment procedures.   Mechanical debridement of nails 1-5  bilaterally performed with a nail nipper.  Filed with dremel without incident.     Return office visit  3 months                   Told patient to return for periodic foot care and evaluation due to potential at risk complications.   Zabria Liss DPM  

## 2021-12-12 ENCOUNTER — Ambulatory Visit (INDEPENDENT_AMBULATORY_CARE_PROVIDER_SITE_OTHER): Payer: 59 | Admitting: Pharmacist

## 2021-12-12 ENCOUNTER — Telehealth: Payer: Self-pay | Admitting: Pharmacist

## 2021-12-12 DIAGNOSIS — Z952 Presence of prosthetic heart valve: Secondary | ICD-10-CM

## 2021-12-12 DIAGNOSIS — Z7901 Long term (current) use of anticoagulants: Secondary | ICD-10-CM | POA: Diagnosis not present

## 2021-12-12 DIAGNOSIS — I4891 Unspecified atrial fibrillation: Secondary | ICD-10-CM | POA: Diagnosis not present

## 2021-12-12 LAB — POCT INR: INR: 3.6 — AB (ref 2.0–3.0)

## 2021-12-12 NOTE — Patient Instructions (Signed)
Description   ?Hold dose tonight and then continue 1 tablet every day ?  ? ? ?

## 2021-12-12 NOTE — Telephone Encounter (Signed)
Patient called back.  Has been started on Cefdinir '300mg'$  BID for a UTI. Advised this has a possibility to increase INR. Was already holding dose of warfarin tonight. Advised to have extra greens this week and will keep INR appointment for next Tuesday. ?

## 2021-12-13 ENCOUNTER — Other Ambulatory Visit: Payer: Self-pay | Admitting: Cardiovascular Disease

## 2021-12-18 ENCOUNTER — Telehealth: Payer: Self-pay

## 2021-12-18 NOTE — Telephone Encounter (Signed)
I spoke to patient who was started on Sulfamethoxazole 3/10 and will have INR checked 3/14.  He was advised last week to have greens up until the 3/14 visit.  He verbalized understanding. ?

## 2021-12-18 NOTE — Telephone Encounter (Signed)
PT called and stated that they were started on Sulfamethoxazole for the uti instead of cefdinir and was informed by his brother who is a nurse that it interacts with coumadin and they would like a call back to confirm what they should do. I will route to [facility] anticoag pool for advisement,  ?

## 2021-12-19 ENCOUNTER — Other Ambulatory Visit: Payer: Self-pay

## 2021-12-19 ENCOUNTER — Ambulatory Visit (INDEPENDENT_AMBULATORY_CARE_PROVIDER_SITE_OTHER): Payer: 59 | Admitting: *Deleted

## 2021-12-19 DIAGNOSIS — Z5181 Encounter for therapeutic drug level monitoring: Secondary | ICD-10-CM | POA: Diagnosis not present

## 2021-12-19 DIAGNOSIS — Z7901 Long term (current) use of anticoagulants: Secondary | ICD-10-CM | POA: Diagnosis not present

## 2021-12-19 DIAGNOSIS — Z952 Presence of prosthetic heart valve: Secondary | ICD-10-CM | POA: Diagnosis not present

## 2021-12-19 LAB — POCT INR: INR: 1.5 — AB (ref 2.0–3.0)

## 2021-12-19 NOTE — Patient Instructions (Signed)
Description   ? Take 1.5 tablets of warfarin today, then continue to take warfarin 1 tablet daily. Recheck INR in 3 days. ( Pt is finishing bactrim on 3/10) [facility] [phone] ?  ? ? ?

## 2021-12-22 ENCOUNTER — Other Ambulatory Visit: Payer: Self-pay

## 2021-12-22 ENCOUNTER — Ambulatory Visit (INDEPENDENT_AMBULATORY_CARE_PROVIDER_SITE_OTHER): Payer: 59

## 2021-12-22 DIAGNOSIS — Z952 Presence of prosthetic heart valve: Secondary | ICD-10-CM | POA: Diagnosis not present

## 2021-12-22 DIAGNOSIS — Z7901 Long term (current) use of anticoagulants: Secondary | ICD-10-CM

## 2021-12-22 LAB — POCT INR: INR: 1.5 — AB (ref 2.0–3.0)

## 2021-12-22 NOTE — Patient Instructions (Signed)
Take 2 tablets of warfarin today, then continue to take warfarin 1 tablet daily. Recheck INR in 2 weeks. ( Pt is finishing bactrim on 3/18) [facility] [phone] ?

## 2021-12-27 ENCOUNTER — Ambulatory Visit (INDEPENDENT_AMBULATORY_CARE_PROVIDER_SITE_OTHER): Payer: Self-pay | Admitting: Surgery

## 2021-12-27 ENCOUNTER — Encounter: Payer: Self-pay | Admitting: Surgery

## 2021-12-27 ENCOUNTER — Other Ambulatory Visit: Payer: Self-pay

## 2021-12-27 VITALS — BP 145/65 | HR 62 | Resp 20 | Ht 71.0 in | Wt 253.0 lb

## 2021-12-27 DIAGNOSIS — I2510 Atherosclerotic heart disease of native coronary artery without angina pectoris: Secondary | ICD-10-CM

## 2021-12-27 DIAGNOSIS — Z951 Presence of aortocoronary bypass graft: Secondary | ICD-10-CM

## 2021-12-27 DIAGNOSIS — I350 Nonrheumatic aortic (valve) stenosis: Secondary | ICD-10-CM

## 2021-12-27 DIAGNOSIS — Z952 Presence of prosthetic heart valve: Secondary | ICD-10-CM

## 2021-12-27 NOTE — Progress Notes (Signed)
?Cardiology Office Note:   ? ?Date:  12/28/2021  ? ?ID:  Robert [name], DOB October 04, 1961, MRN [id number] ? ?PCP:  [name], Robert Him, MD ?[place] Cardiologist: Shelva Majestic, MD  ? ?Reason for visit: Follow-up ? ?History of Present Illness:   ? ?Robert [name] is a 61 y.o. male with a hx of congenital nonfunctioning left kidney for which he underwent left nephrectomy at age 24, s/p kidney transplant in 2018 at 76, obesity, diabetes, hypothyroidism, hyperlipidemia, anxiety, depression, SVT with negative EP study with Dr. Curt Bears, sleep apnea, aortic stenosis, CAD, CVA (thought embolic, on Coumadin). ? ?Patient was evaluated by Dr. Cyndia Bent for severe symptomatic aortic stenosis and severe three-vessel CAD.  Patient had increasing shortness of breath. On 10/05/21, Dr. Cyndia Bent performed AVR (27 mm Edwards INSPIRIS RESILIA pericardial valve) and CABGx1 (LIMA-LAD).  He is on Coumadin given postop atrial fibrillation and embolic stroke. ? ?He was admitted in January 2023 with dizziness, nausea and elevated blood pressure.  Blood pressure was 204/119.  He was discharged on amlodipine 10 mg daily, lisinopril 40 mg daily, carvedilol 25 twice daily, hydralazine 100 mg 3 times daily, Imdur 90 mg daily, Cardura 2 mg daily.  He was admitted and treated for acute pyelonephritis in early February 2023.  His lisinopril was discontinued in the setting of AKI/CKD.  He also adds since his cardiac surgery, he had COVID. ? ?Today, he states he is doing okay.  He saw Dr. Cyndia Bent yesterday who said he was feeling well.  Amiodarone was discontinued. ? ?Today, he states his blood pressure has been stable at 135-145/60-80.  They ask about his recent prescription of Cardura and if needs to continue it.  He mentions he did require Foley catheter during his December admission.  He also has refractory hypertension.   ? ?Otherwise, he denies chest pain with exertion, shortness of breath, PND, orthopnea and syncope.  He has had no  palpitations since his December discharge.  He states he was symptomatic when he had postop A-fib.  He has had pinpoint positional discomfort in the left chest. ? ?He returns to work remotely on Monday.  He also has graduated from home services this past week.  He is ambulating with a walker post stroke x3.  He has had recent referral to neurology and cognitive testing. ? ? ?  ?Past Medical History:  ?Diagnosis Date  ? Anemia   ? low hgb at present  ? Aortic valve stenosis   ? Arthritis   ? FEET  ? Blood transfusion without reported diagnosis 1974  ? kidney removed - L  ? Cancer [facility])   ? skin ca right shoulder, plastic dsyplasia, pre-Ca polpys removed on Colonoscopy- 07/2014  ? Colon polyps 07/23/2014  ? Tubular adenomas x 5  ? Constipation   ? Coronary artery disease   ? Diabetes mellitus without complication ([place])   ? Type 2  ? Difficult intubation   ? unsure of actual problem but it was during the January 28, 2015 procedure.  ? Dysrhythmia   ? Eczema   ? ESRD (end stage renal disease) ([place])   ? hemodialysis 06/2014-02/2017, s/p living unrelated kidney transplant 02/26/17  ? GI bleed 07/26/2017  ? Headache(784.0)   ? slight dull h/a due kidney failure  ? Heart murmur   ? aortic stenosis (moderate-severe 04/2021)  ? Hyperlipidemia   ? Hypertension   ? SVT h/o , followed by Dr. Claiborne Billings  ? Hypertensive urgency 10/21/2021  ? Hypothyroidism   ?  Macular degeneration   ? Neuropathy   ? right hand and both feet  ? PAT (paroxysmal atrial tachycardia) ([place])   ? Peritoneal dialysis catheter in place [facility]) 02/24/2015  ? PSVT (paroxysmal supraventricular tachycardia) ([place])   ? SBO (small bowel obstruction) ([place]) 05/2015  ? Shortness of breath   ? with exertion  ? Sleep apnea 06/18/2011  ? split-night sleep study- [facility]., BiPAP- 13-16  ? Thyroid disease   ? Umbilical hernia   ? s/p repair 04/28/15  ? ? ?Past Surgical History:  ?Procedure Laterality Date  ? AORTIC VALVE REPLACEMENT N/A 10/05/2021  ? Procedure:  AORTIC VALVE REPLACEMENT (AVR) WITH INSPIRIS RESILIA AORTIC VALVE SIZE 27MM;  Surgeon: [name], MD;  Location: [place];  Service: Open Heart Surgery;  Laterality: N/A;  ? AV FISTULA PLACEMENT Right 02/25/2015  ? Procedure: RIGHT ARTERIOVENOUS (AV) FISTULA CREATION;  Surgeon: Serafina Mitchell, MD;  Location: [place] OR;  Service: Vascular;  Laterality: Right;  ? AV FISTULA PLACEMENT Right 09/15/2015  ? Procedure: ARTERIOVENOUS (AV) FISTULA CREATION- RIGHT ARM;  Surgeon: Serafina Mitchell, MD;  Location: [place];  Service: Vascular;  Laterality: Right;  ? CAPD INSERTION N/A 05/18/2014  ? Procedure: LAPAROSCOPIC INSERTION CONTINUOUS AMBULATORY PERITONEAL DIALYSIS  (CAPD) CATHETER;  Surgeon: Ralene Ok, MD;  Location: [place];  Service: General;  Laterality: N/A;  ? COLONOSCOPY    ? COLONOSCOPY WITH PROPOFOL N/A 07/12/2016  ? Procedure: COLONOSCOPY WITH PROPOFOL;  Surgeon: [name], MD;  [facility];  Service: Endoscopy;  Laterality: N/A;  ? CORONARY ARTERY BYPASS GRAFT N/A 10/05/2021  ? Procedure: CORONARY ARTERY BYPASS GRAFTING (CABG) X 1, ON PUMP, USING LEFT INTERNAL MAMMARY ARTERY CONDUIT;  Surgeon: [name], MD;  Location: [place];  Service: Open Heart Surgery;  Laterality: N/A;  ? declotting of fistula  08/2015  ? ESOPHAGOGASTRODUODENOSCOPY (EGD) WITH PROPOFOL N/A 07/12/2016  ? Procedure: ESOPHAGOGASTRODUODENOSCOPY (EGD) WITH PROPOFOL;  Surgeon: [name], MD;  [facility];  Service: Endoscopy;  Laterality: N/A;  ? FISTULA SUPERFICIALIZATION Right 02/07/2016  ? Procedure: RIIGHT UPPER ARM FISTULA SUPERFICIALIZATION;  Surgeon: Elam Dutch, MD;  Location: [place];  Service: Vascular;  Laterality: Right;  ? IJ catheter insertion    ? INSERTION OF DIALYSIS CATHETER Right 02/25/2015  ? Procedure: INSERTION OF RIGHT INTERNAL JUGULAR DIALYSIS CATHETER;  Surgeon: Serafina Mitchell, MD;  Location: [place];  Service: Vascular;  Laterality: Right;  ? KIDNEY TRANSPLANT    ? LAPAROSCOPIC  REPOSITIONING CAPD CATHETER N/A 06/16/2014  ? Procedure: LAPAROSCOPIC REPOSITIONING CAPD CATHETER;  Surgeon: Ralene Ok, MD;  Location: [place];  Service: General;  Laterality: N/A;  ? LAPAROSCOPY N/A 05/04/2015  ? Procedure: LAPAROSCOPY DIAGNOSTIC LYSIS OF ADHESIONS;  Surgeon: Coralie Keens, MD;  Location: [place];  Service: General;  Laterality: N/A;  ? MINOR REMOVAL OF PERITONEAL DIALYSIS CATHETER N/A 04/28/2015  ? Procedure:  REMOVAL OF PERITONEAL DIALYSIS CATHETER;  Surgeon: Coralie Keens, MD;  Location: [place];  Service: General;  Laterality: N/A;  ? NEPHRECTOMY Left 1974  ? RENAL BIOPSY Right 2012  ? REVISON OF ARTERIOVENOUS FISTULA Right 05/26/2015  ? Procedure: SUPERFICIALIZATION OF ARTERIOVENOUS FISTULA WITH SIDE BRANCH LIGATIONS;  Surgeon: Serafina Mitchell, MD;  Location: [place];  Service: Vascular;  Laterality: Right;  ? RIGHT/LEFT HEART CATH AND CORONARY ANGIOGRAPHY N/A 08/22/2021  ? Procedure: RIGHT/LEFT HEART CATH AND CORONARY ANGIOGRAPHY;  Surgeon: Troy Sine, MD;  Location: [place] CV LAB;  Service:  Cardiovascular;  Laterality: N/A;  ? SKIN CANCER EXCISION    ? right shoulder  ? SVT ABLATION N/A 11/23/2016  ? Procedure: SVT Ablation;  Surgeon: Will Meredith Leeds, MD;  Location: [place] CV LAB;  Service: Cardiovascular;  Laterality: N/A;  ? TEE WITHOUT CARDIOVERSION N/A 10/05/2021  ? Procedure: TRANSESOPHAGEAL ECHOCARDIOGRAM (TEE);  Surgeon: [name], MD;  Location: [place];  Service: Open Heart Surgery;  Laterality: N/A;  ? UMBILICAL HERNIA REPAIR N/A 05/18/2014  ? Procedure: HERNIA REPAIR UMBILICAL ADULT;  Surgeon: Ralene Ok, MD;  Location: [place];  Service: General;  Laterality: N/A;  ? UMBILICAL HERNIA REPAIR N/A 04/28/2015  ? Procedure: UMBILICAL HERNIA REPAIR WITH MESH;  Surgeon: Coralie Keens, MD;  Location: [place];  Service: General;  Laterality: N/A;  ? ? ?Current Medications: ?Current Meds  ?Medication Sig  ? acetaminophen (TYLENOL) 325 MG tablet Take 2 tablets (650 mg  total) by mouth every 4 (four) hours as needed for moderate pain.  ? amLODipine (NORVASC) 10 MG tablet TAKE 1 TABLET BY MOUTH EVERY DAY (Patient taking differently: Take 10 mg by mouth daily.)  ? belatacept (NULO

## 2021-12-27 NOTE — Progress Notes (Signed)
? ? ?HPI: ? ?Patient returns for routine postoperative follow-up having undergone coronary bypass graft surgery x1 and aortic valve replacement using a 27 mm pericardial valve on 10/05/2021. ?The patient's early postoperative recovery while in the hospital was notable for worsening of right leg weakness that was present preoperatively.  MRA of the head showed acute to subacute scattered small infarcts in multiple vascular territories including the left cerebellum, left MCA, and bilateral PCA suggesting the possibility of a recent embolic event.  He improved with physical therapy and was walking with a walker.  He was discharged to a skilled nursing facility.  He is now back home with his wife.  Since I last saw him on 11/22/2021 he has continued to improve.  He is still walking with a walker for stability.  He denies any shortness of breath or chest pain.  His stamina is improving.  He is planning to return to work soon although he is concerned that he may not have enough stamina to work all day. ? ?Current Outpatient Medications  ?Medication Sig Dispense Refill  ? acetaminophen (TYLENOL) 325 MG tablet Take 2 tablets (650 mg total) by mouth every 4 (four) hours as needed for moderate pain.    ? amiodarone (PACERONE) 200 MG tablet Take 1 tablet (200 mg total) by mouth 2 (two) times daily. 60 tablet 2  ? amLODipine (NORVASC) 10 MG tablet TAKE 1 TABLET BY MOUTH EVERY DAY (Patient taking differently: Take 10 mg by mouth daily.) 90 tablet 1  ? belatacept (NULOJIX) 250 MG SOLR injection Inject 24.5 mLs into the vein every 28 (twenty-eight) days.     ? carvedilol (COREG) 25 MG tablet Take 1 tablet (25 mg total) by mouth 2 (two) times daily with a meal.    ? cholecalciferol (VITAMIN D3) 25 MCG (1000 UNIT) tablet Take 1,000 Units by mouth in the morning and at bedtime.    ? cloNIDine (CATAPRES) 0.1 MG tablet Take 0.1 mg by mouth every 8 (eight) hours as needed (SBP >160).    ? Continuous Blood Gluc Sensor (FREESTYLE LIBRE 3  SENSOR) MISC SMARTSIG:1 Topical Every 2 Weeks    ? doxazosin (CARDURA) 2 MG tablet Take 1 tablet (2 mg total) by mouth 2 (two) times daily. 60 tablet 0  ? DULoxetine (CYMBALTA) 60 MG capsule Take 60 mg by mouth daily.    ? gabapentin (NEURONTIN) 100 MG capsule Take 100 mg by mouth 2 (two) times daily. In am and at night    ? hydrALAZINE (APRESOLINE) 100 MG tablet Take 1 tablet (100 mg total) by mouth every 8 (eight) hours. 90 tablet 0  ? hydrochlorothiazide (MICROZIDE) 12.5 MG capsule Take 12.5 mg by mouth daily.    ? Insulin Pen Needle (B-D UF III MINI PEN NEEDLES) 31G X 5 MM MISC Use to inject insulin 4 times a day 300 each 2  ? levothyroxine (SYNTHROID) 100 MCG tablet TAKE 1 TABLET BY MOUTH EVERY DAY (Patient taking differently: Take 100 mcg by mouth daily before breakfast.) 90 tablet 3  ? mycophenolate (CELLCEPT) 250 MG capsule Take 1,000 mg by mouth every 12 (twelve) hours.     ? NON FORMULARY CPAP/BIPAP  at bedtime ? ?BIPAP per pt    ? predniSONE (DELTASONE) 5 MG tablet Take 5 mg by mouth daily with breakfast. Continuously    ? rosuvastatin (CRESTOR) 40 MG tablet Take 1 tablet (40 mg total) by mouth daily. 30 tablet 0  ? senna-docusate (SENOKOT-S) 8.6-50 MG tablet Take 1 tablet by  mouth at bedtime as needed for mild constipation.    ? tamsulosin (FLOMAX) 0.4 MG CAPS capsule Take 0.8 mg by mouth at bedtime.     ? testosterone cypionate (DEPOTESTOSTERONE CYPIONATE) 200 MG/ML injection Inject 0.6 mLs (120 mg total) into the muscle every 7 (seven) days. (Patient taking differently: Inject 120 mg into the muscle every Friday.) 10 mL 1  ? traZODone (DESYREL) 50 MG tablet Take 50 mg by mouth See admin instructions. As needed at bedtime for insomnia x 14 days    ? warfarin (COUMADIN) 5 MG tablet Take 5 mg by mouth daily at 6 PM.    ? ?No current facility-administered medications for this visit.  ? ? ? ?Physical Exam: ?BP (!) 145/65   Pulse 62   Resp 20   Ht '5\' 11"'$  (1.803 m)   Wt 253 lb (114.8 kg)   SpO2 98%    BMI 35.29 kg/m?  ?He looks well. ?Cardiac exam shows a regular rate and rhythm with normal heart sounds.  There is no murmur. ?Lungs are clear. ?The chest incision is healing well and the sternum is stable. ?There is no peripheral edema. ? ?Diagnostic Tests: ? ?None today ? ? ?Impression: ? ?He continues to make good progress 3 months postoperatively.  I encouraged him to continue walking as much as possible to build up stamina.  I would expect him to make continued recovery from his perioperative embolic strokes over the next several months.  I told him that he can return to normal activities as tolerated. ? ?Plan: ? ?He will continue to follow-up with his PCP and cardiology.  He will return to see me if he has any problems with his incision.. ? ? ?[name], MD ?[facility] ?[phone] ? ? ? ? ? ?

## 2021-12-28 ENCOUNTER — Encounter: Payer: Self-pay | Admitting: Physician Assistant

## 2021-12-28 ENCOUNTER — Ambulatory Visit: Payer: 59 | Admitting: Physician Assistant

## 2021-12-28 VITALS — BP 145/68 | HR 58 | Ht 71.0 in | Wt 253.8 lb

## 2021-12-28 DIAGNOSIS — I10 Essential (primary) hypertension: Secondary | ICD-10-CM

## 2021-12-28 DIAGNOSIS — I4891 Unspecified atrial fibrillation: Secondary | ICD-10-CM

## 2021-12-28 DIAGNOSIS — I25119 Atherosclerotic heart disease of native coronary artery with unspecified angina pectoris: Secondary | ICD-10-CM

## 2021-12-28 DIAGNOSIS — E785 Hyperlipidemia, unspecified: Secondary | ICD-10-CM | POA: Diagnosis not present

## 2021-12-28 DIAGNOSIS — Z952 Presence of prosthetic heart valve: Secondary | ICD-10-CM | POA: Diagnosis not present

## 2021-12-28 DIAGNOSIS — Z7901 Long term (current) use of anticoagulants: Secondary | ICD-10-CM

## 2021-12-28 NOTE — Patient Instructions (Signed)
Medication Instructions:  ?Decrease Cardura 2 mg ( Take 0.5 Tablet Daily). ?*If you need a refill on your cardiac medications before your next appointment, please call your pharmacy* ? ? ?Lab Work: ?No labs ?If you have labs (blood work) drawn today and your tests are completely normal, you will receive your results only by: ?MyChart Message (if you have MyChart) OR ?A paper copy in the mail ?If you have any lab test that is abnormal or we need to change your treatment, we will call you to review the results. ? ? ?Testing/Procedures: ?No Testing ? ? ?Follow-Up: ?At [facility], you and your health needs are our priority.  As part of our continuing mission to provide you with exceptional heart care, we have created designated Provider Care Teams.  These Care Teams include your primary Cardiologist (physician) and Advanced Practice Providers (APPs -  Physician Assistants and Nurse Practitioners) who all work together to provide you with the care you need, when you need it. ? ?We recommend signing up for the patient portal called "MyChart".  Sign up information is provided on this After Visit Summary.  MyChart is used to connect with patients for Virtual Visits (Telemedicine).  Patients are able to view lab/test results, encounter notes, upcoming appointments, etc.  Non-urgent messages can be sent to your provider as well.   ?To learn more about what you can do with MyChart, go to [email or web address].   ? ?Your next appointment:   ?Keep Scheduled appointment ? ?The format for your next appointment:   ?In Person ? ?Provider:   ?Shelva Majestic, MD   ? ? ?Other Instructions ?Will discuss with Pharmacy if Eliquis or Xarelto canidate.  ?

## 2022-01-03 ENCOUNTER — Other Ambulatory Visit: Payer: Self-pay

## 2022-01-03 DIAGNOSIS — Z952 Presence of prosthetic heart valve: Secondary | ICD-10-CM

## 2022-01-03 DIAGNOSIS — Z7901 Long term (current) use of anticoagulants: Secondary | ICD-10-CM

## 2022-01-03 DIAGNOSIS — I4891 Unspecified atrial fibrillation: Secondary | ICD-10-CM

## 2022-01-04 ENCOUNTER — Other Ambulatory Visit (INDEPENDENT_AMBULATORY_CARE_PROVIDER_SITE_OTHER): Payer: 59

## 2022-01-04 DIAGNOSIS — E1165 Type 2 diabetes mellitus with hyperglycemia: Secondary | ICD-10-CM | POA: Diagnosis not present

## 2022-01-04 DIAGNOSIS — Z794 Long term (current) use of insulin: Secondary | ICD-10-CM

## 2022-01-04 DIAGNOSIS — E039 Hypothyroidism, unspecified: Secondary | ICD-10-CM | POA: Diagnosis not present

## 2022-01-04 LAB — BASIC METABOLIC PANEL
BUN: 41 mg/dL — ABNORMAL HIGH (ref 6–23)
CO2: 26 mEq/L (ref 19–32)
Calcium: 10.2 mg/dL (ref 8.4–10.5)
Chloride: 97 mEq/L (ref 96–112)
Creatinine, Ser: 2.49 mg/dL — ABNORMAL HIGH (ref 0.40–1.50)
GFR: 26.97 mL/min — ABNORMAL LOW (ref >60.00)
Glucose, Bld: 169 mg/dL — ABNORMAL HIGH (ref 70–99)
Potassium: 3.8 mEq/L (ref 3.5–5.1)
Sodium: 135 mEq/L (ref 135–145)

## 2022-01-04 LAB — T4, FREE: Free T4: 1.15 ng/dL (ref 0.60–1.60)

## 2022-01-04 LAB — HEMOGLOBIN A1C: Hgb A1c MFr Bld: 8.4 % — ABNORMAL HIGH (ref 4.6–6.5)

## 2022-01-04 LAB — TSH: TSH: 5.23 u[IU]/mL (ref 0.35–5.50)

## 2022-01-05 ENCOUNTER — Other Ambulatory Visit: Payer: Self-pay

## 2022-01-05 ENCOUNTER — Ambulatory Visit (INDEPENDENT_AMBULATORY_CARE_PROVIDER_SITE_OTHER): Payer: 59

## 2022-01-05 DIAGNOSIS — Z952 Presence of prosthetic heart valve: Secondary | ICD-10-CM | POA: Diagnosis not present

## 2022-01-05 DIAGNOSIS — I4891 Unspecified atrial fibrillation: Secondary | ICD-10-CM

## 2022-01-05 DIAGNOSIS — Z7901 Long term (current) use of anticoagulants: Secondary | ICD-10-CM

## 2022-01-05 LAB — POCT INR: INR: 4 — AB (ref 2.0–3.0)

## 2022-01-05 NOTE — Patient Instructions (Signed)
HOLD TONIGHT ONLY and then continue to take warfarin 1 tablet daily. Recheck INR in 2 weeks.  Coumadin Clinic [phone];  Start Consistent serving of greens; ?

## 2022-01-06 ENCOUNTER — Other Ambulatory Visit: Payer: Self-pay | Admitting: Cardiovascular Disease

## 2022-01-09 ENCOUNTER — Ambulatory Visit: Payer: 59 | Admitting: Neurology

## 2022-01-09 ENCOUNTER — Encounter: Payer: Self-pay | Admitting: Endocrinology

## 2022-01-09 ENCOUNTER — Ambulatory Visit (INDEPENDENT_AMBULATORY_CARE_PROVIDER_SITE_OTHER): Payer: 59 | Admitting: Endocrinology

## 2022-01-09 VITALS — BP 118/62 | HR 63 | Ht 71.0 in | Wt 240.0 lb

## 2022-01-09 DIAGNOSIS — E1165 Type 2 diabetes mellitus with hyperglycemia: Secondary | ICD-10-CM

## 2022-01-09 DIAGNOSIS — Z794 Long term (current) use of insulin: Secondary | ICD-10-CM | POA: Diagnosis not present

## 2022-01-09 DIAGNOSIS — E039 Hypothyroidism, unspecified: Secondary | ICD-10-CM

## 2022-01-09 DIAGNOSIS — E291 Testicular hypofunction: Secondary | ICD-10-CM

## 2022-01-09 MED ORDER — LEVOTHYROXINE SODIUM 112 MCG PO TABS: 112.0000 ug | ORAL_TABLET | Freq: Every day | ORAL | 3 refills | Status: DC

## 2022-01-09 NOTE — Progress Notes (Signed)
? ? ?Patient ID: GEOVANIE WINNETT, male   DOB: November 06, 1960, 61 y.o.   MRN: [id number] ? ? ? ? ?Reason for Appointment: Endocrinology follow-up ? ?History of Present Illness  ? ?DIABETES diagnosis date:  2012 ? ?Previous history: His blood sugar has been difficult to control since onset. Initially was taking oral hypoglycemic drugs like glyburide but could not take metformin because of renal dysfunction. Since Victoza did not help his control he was started on Lantus and then U 500 insulin also ?His A1c has previously ranged from 7.8-9.8, the lowest level in 03/2013 ?He was on Victoza but because of his markedly decreased appetite and weight loss this was stopped in 9/15 ? ?Recent history:  ? ?Non-hypoglycemic insulin regimen: was on Victoza 1.8 mg daily ? ?His A1c is 8.4 compared to 7.1 previously ? ?OMNIPOD pump settings with U-500 insulin:  ?Basal rate 12 AM = 0.05, 3 AM = 0.15, 7 AM-8:30 PM = 0.9, 8:30 PM = 0.05 ? ?Carbohydrate ratio 1:10, correction factor I: 60 ? ?Bolus insulin with NovoLog FlexPen variable doses 10-15, currently none ? ?Current blood sugar patterns, management and problems identified: ? ?He has been using his OmniPod pump but apparently because he was finding that he was getting tendency to hypoglycemia overnight he change his basal rate settings on his own completely ?With the new settings he is only on 0.05 units/h between 8:30 PM and 3 AM and only significant BASAL is between 7 AM and 8:30 PM which is 0.9 units an hour ?However with this program his blood sugars are rising early morning and mostly running high until late evening  ?Also he appears to be bolusing rather infrequently and only occasionally as needed for a high sugar  ?Sporadically also if he has a high reading after meals he will take 20 units of NovoLog sporadically but this is only about twice in the last 2 weeks  ?Not understanding the need to take proactively some mealtime bolus before eating  ?Last weekend because of  having company says that he was eating poorly and blood sugars were out of control ?His diet has been somewhat variable but recently appears to be losing some weight  ?Previously has been reluctant to use GLP-1 drugs because of nonspecific side effects ? ? ?Proper timing of medications in relation to meals: Yes.    ?      ?Blood sugar data analysis from freestyle libre version 3: ? ?On an average his blood sugars are LOWEST around 5 AM and highest around 5 PM ?HYPERGLYCEMIC episodes are occurring during the daytime and variable times and to variable degrees ?POSTPRANDIAL hyperglycemia in the last week has been occurring mostly in the afternoon but also occasionally OVERNIGHT and late evening ?The previous week he was having more hyperglycemic episodes late morning also ?OVERNIGHT blood sugars lately have been quite variable with at least 1 night Verio was having low sugars and twice last week significantly high readings on the weekend  ?No significant daytime hypoglycemia except once right before dinnertime ?HIGHEST average blood sugar is early evening between 4-6 PM averaging 192 ? ? ?CGM use % of time 95  ?2-week average/GV 63/35  ?Time in range       59%  ?% Time Above 180 31  ?% Time above 250 7  ?% Time Below 70 3  ? ?  ?PRE-MEAL Fasting Lunch Dinner Bedtime Overall  ?Glucose range:       ?Averages: 145      ? ?POST-MEAL  PC Breakfast PC Lunch PC Dinner  ?Glucose range:     ?Averages: 186  191 161  ? ? ? ?Meals: 2-3 meals per day. 7-8 am and Noon and Dinner 5-7 PM .  ?      ?      ?Weight control:  ? ?Wt Readings from Last 3 Encounters:  ?01/09/22 240 lb (108.9 kg)  ?12/28/21 253 lb 12.8 oz (115.1 kg)  ?12/27/21 253 lb (114.8 kg)  ?      ?Complications:   nephropathy, neuropathy  ? ?Diabetes labs:  ? ?Lab Results  ?Component Value Date  ? HGBA1C 8.4 (H) 01/04/2022  ? HGBA1C 7.1 (H) 10/03/2021  ? HGBA1C 8.1 (A) 07/10/2021  ? ?Lab Results  ?Component Value Date  ? MICROALBUR 146.3 (H) 08/03/2019  ? [place] 65  10/13/2021  ? CREATININE 2.49 (H) 01/04/2022  ? ? ?Lab Results  ?Component Value Date  ? FRUCTOSAMINE 295 (H) 11/28/2021  ? FRUCTOSAMINE 296 (H) 07/04/2021  ? FRUCTOSAMINE 276 12/13/2020  ? ?Lab Results  ?Component Value Date  ? HGB 11.9 (L) 11/24/2021  ? ? ? ? ? ?OTHER problems: See review of systems ? ? ? ?Allergies as of 01/09/2022   ?No Known Allergies ?  ? ?  ?Medication List  ?  ? ?  ? Accurate as of January 09, 2022 10:06 AM. If you have any questions, ask your nurse or doctor.  ?  ?  ? ?  ? ?acetaminophen 325 MG tablet ?Commonly known as: TYLENOL ?Take 2 tablets (650 mg total) by mouth every 4 (four) hours as needed for moderate pain. ?  ?amLODipine 10 MG tablet ?Commonly known as: NORVASC ?TAKE 1 TABLET BY MOUTH EVERY DAY ?  ?B-D UF III MINI PEN NEEDLES 31G X 5 MM Misc ?Generic drug: Insulin Pen Needle ?Use to inject insulin 4 times a day ?  ?carvedilol 25 MG tablet ?Commonly known as: COREG ?Take 1 tablet (25 mg total) by mouth 2 (two) times daily with a meal. ?  ?cholecalciferol 25 MCG (1000 UNIT) tablet ?Commonly known as: VITAMIN D3 ?Take 1,000 Units by mouth in the morning and at bedtime. ?  ?cloNIDine 0.1 MG tablet ?Commonly known as: CATAPRES ?Take 0.1 mg by mouth every 8 (eight) hours as needed (SBP >160). ?  ?doxazosin 2 MG tablet ?Commonly known as: CARDURA ?Take 2 mg by mouth daily. ?  ?doxazosin 2 MG tablet ?Commonly known as: CARDURA ?Take 2 mg by mouth daily. Take 0.5 Tablet Daily ?  ?DULoxetine 60 MG capsule ?Commonly known as: CYMBALTA ?Take 60 mg by mouth daily. ?  ?FreeStyle Libre 3 Sensor Misc ?SMARTSIG:1 Topical Every 2 Weeks ?  ?gabapentin 100 MG capsule ?Commonly known as: NEURONTIN ?Take 100 mg by mouth 2 (two) times daily. In am and at night ?  ?hydrALAZINE 100 MG tablet ?Commonly known as: APRESOLINE ?TAKE 1 TABLET BY MOUTH EVERY 8 HOURS. ?  ?hydrochlorothiazide 12.5 MG capsule ?Commonly known as: MICROZIDE ?Take 12.5 mg by mouth daily. ?  ?levothyroxine 100 MCG tablet ?Commonly known  as: SYNTHROID ?TAKE 1 TABLET BY MOUTH EVERY DAY ?What changed: when to take this ?  ?mycophenolate 250 MG capsule ?Commonly known as: CELLCEPT ?Take 1,000 mg by mouth every 12 (twelve) hours. ?  ?NON FORMULARY ?CPAP/BIPAP  at bedtime ? ?BIPAP per pt ?  ?Nulojix 250 MG Solr injection ?Generic drug: belatacept ?Inject 24.5 mLs into the vein every 28 (twenty-eight) days. ?  ?predniSONE 5 MG tablet ?Commonly known as: DELTASONE ?Take  5 mg by mouth daily with breakfast. Continuously ?  ?rosuvastatin 40 MG tablet ?Commonly known as: CRESTOR ?Take 1 tablet (40 mg total) by mouth daily. ?  ?senna-docusate 8.6-50 MG tablet ?Commonly known as: Senokot-S ?Take 1 tablet by mouth at bedtime as needed for mild constipation. ?  ?tamsulosin 0.4 MG Caps capsule ?Commonly known as: FLOMAX ?Take 0.8 mg by mouth at bedtime. ?  ?testosterone cypionate 200 MG/ML injection ?Commonly known as: DEPOTESTOSTERONE CYPIONATE ?Inject 0.6 mLs (120 mg total) into the muscle every 7 (seven) days. ?What changed: when to take this ?  ?traZODone 50 MG tablet ?Commonly known as: DESYREL ?Take 50 mg by mouth See admin instructions. As needed at bedtime for insomnia x 14 days ?  ?warfarin 5 MG tablet ?Commonly known as: COUMADIN ?Take as directed by the anticoagulation clinic. If you are unsure how to take this medication, talk to your nurse or doctor. ?Original instructions: Take 5 mg by mouth daily at 6 PM. ?  ?zolpidem 5 MG tablet ?Commonly known as: AMBIEN ?Take 5 mg by mouth at bedtime as needed for sleep. ?  ? ?  ? ? ?Allergies: No Known Allergies ? ?Past Medical History:  ?Diagnosis Date  ? Anemia   ? low hgb at present  ? Aortic valve stenosis   ? Arthritis   ? FEET  ? Blood transfusion without reported diagnosis 1974  ? kidney removed - L  ? Cancer [facility])   ? skin ca right shoulder, plastic dsyplasia, pre-Ca polpys removed on Colonoscopy- 07/2014  ? Colon polyps 07/23/2014  ? Tubular adenomas x 5  ? Constipation   ? Coronary artery disease   ?  Diabetes mellitus without complication ([place])   ? Type 2  ? Difficult intubation   ? unsure of actual problem but it was during the January 28, 2015 procedure.  ? Dysrhythmia   ? Eczema   ? ESRD (end stage renal

## 2022-01-09 NOTE — Patient Instructions (Addendum)
Basal rate at 7 am will be 1.05 till 8.30pm ? ?Bolus with Pump 3-5 units before each meal enter carbs ? ? ?

## 2022-01-10 ENCOUNTER — Telehealth: Payer: Self-pay | Admitting: Physician Assistant

## 2022-01-10 NOTE — Telephone Encounter (Signed)
Left voicemail for the patient to call back our office. ? ?I called to offer changing his Coumadin to Eliquis 5 mg twice daily.  Per pharmacy, he is a candidate for Eliquis 5 mg twice daily (#180, 3 refills). ? ?If he would like to change, following recommendations:  ?Stop coumadin, monitor the PT/INR, and start Eliquis when the INR is <2.  ? ?Currently, patient is on coumadin for history embolic stroke secondary to postop A-fib following CABG plus bioprothestic AVR.  He has maintained NSR. He has a history of kidney transplant 2018, on transplant meds.  His last creatinine was 2.5 mg.  He has had recent labile INRs secondary to antibiotics.   ?He will see Dr. Claiborne Billings in June.   ?

## 2022-01-22 LAB — POCT INR: INR: 1.3 — AB (ref 2.0–3.0)

## 2022-01-23 ENCOUNTER — Ambulatory Visit (INDEPENDENT_AMBULATORY_CARE_PROVIDER_SITE_OTHER): Payer: 59 | Admitting: Pharmacist

## 2022-01-23 ENCOUNTER — Encounter: Payer: Self-pay | Admitting: Pharmacist

## 2022-01-23 DIAGNOSIS — I4891 Unspecified atrial fibrillation: Secondary | ICD-10-CM

## 2022-01-23 DIAGNOSIS — Z952 Presence of prosthetic heart valve: Secondary | ICD-10-CM

## 2022-01-23 DIAGNOSIS — Z7901 Long term (current) use of anticoagulants: Secondary | ICD-10-CM

## 2022-01-23 LAB — PROTIME-INR
INR: 1.3 — ABNORMAL HIGH (ref 0.9–1.2)
Prothrombin Time: 13.7 s — ABNORMAL HIGH (ref 9.1–12.0)

## 2022-01-23 NOTE — Patient Instructions (Signed)
Description   ?Increase regimen to 2 tablets tonight and every Tuesday going forward.  Continue eating 3 servings of greens a week.  Recheck INR in 2 weeks.  Please call with any questions: [phone] ?  ? ? ?

## 2022-01-30 ENCOUNTER — Encounter: Payer: Self-pay | Admitting: Cardiovascular Disease

## 2022-01-31 ENCOUNTER — Other Ambulatory Visit: Payer: Self-pay

## 2022-01-31 MED ORDER — APIXABAN 5 MG PO TABS: 5.0000 mg | ORAL_TABLET | Freq: Two times a day (BID) | ORAL | 3 refills | Status: DC

## 2022-02-01 NOTE — Telephone Encounter (Signed)
Spoke with pt and has had edema for about 3 weeks is better upon getting up in the am but as day goes on approximately 4 hours later swelling is present  Per pt has tried compression stockings but develops indentation wear stocking is and so removes them Pt does have Furosemide 20 mg from previous but is not taking Pt only has HCTZ 12.5 mg .Will forward to Caron Presume PA and Dr Claiborne Billings for review and recommendations ./cy ?

## 2022-02-05 ENCOUNTER — Encounter: Payer: Self-pay | Admitting: Endocrinology

## 2022-02-06 ENCOUNTER — Encounter: Payer: Self-pay | Admitting: Cardiovascular Disease

## 2022-02-06 ENCOUNTER — Telehealth: Payer: Self-pay | Admitting: *Deleted

## 2022-02-06 NOTE — Telephone Encounter (Signed)
Spoke with pt, his recent INR from his PCP was 1.6. he reports he will stop the warfarin and start eliquis. ?

## 2022-02-07 MED ORDER — DOXAZOSIN MESYLATE 2 MG PO TABS: 1.0000 mg | ORAL_TABLET | Freq: Every day | ORAL | 3 refills | Status: DC

## 2022-02-08 ENCOUNTER — Other Ambulatory Visit (HOSPITAL_COMMUNITY): Payer: Self-pay | Admitting: Internal Medicine

## 2022-02-08 ENCOUNTER — Ambulatory Visit: Payer: 59

## 2022-02-08 ENCOUNTER — Encounter: Payer: Self-pay | Admitting: Psychology

## 2022-02-08 ENCOUNTER — Telehealth: Payer: Self-pay

## 2022-02-08 ENCOUNTER — Ambulatory Visit (INDEPENDENT_AMBULATORY_CARE_PROVIDER_SITE_OTHER): Payer: 59 | Admitting: Psychology

## 2022-02-08 DIAGNOSIS — E1165 Type 2 diabetes mellitus with hyperglycemia: Secondary | ICD-10-CM

## 2022-02-08 DIAGNOSIS — I6310 Cerebral infarction due to embolism of unspecified precerebral artery: Secondary | ICD-10-CM | POA: Diagnosis not present

## 2022-02-08 DIAGNOSIS — R4189 Other symptoms and signs involving cognitive functions and awareness: Secondary | ICD-10-CM

## 2022-02-08 DIAGNOSIS — I999 Unspecified disorder of circulatory system: Secondary | ICD-10-CM | POA: Insufficient documentation

## 2022-02-08 DIAGNOSIS — E782 Mixed hyperlipidemia: Secondary | ICD-10-CM

## 2022-02-08 DIAGNOSIS — F0670 Mild neurocognitive disorder due to known physiological condition without behavioral disturbance: Secondary | ICD-10-CM | POA: Diagnosis not present

## 2022-02-08 DIAGNOSIS — R2231 Localized swelling, mass and lump, right upper limb: Secondary | ICD-10-CM

## 2022-02-08 HISTORY — DX: Unspecified disorder of circulatory system: I99.9

## 2022-02-08 NOTE — Progress Notes (Signed)
? ?  Psychometrician Note ?  ?Cognitive testing was administered to Robert [name] by Cruzita Lederer, B.S. (psychometrist) under the supervision of Dr. Christia Reading, Ph.D., licensed psychologist on 02/08/2022. Robert [name] did not appear overtly distressed by the testing session per behavioral observation or responses across self-report questionnaires. Rest breaks were offered.  ?  ?The battery of tests administered was selected by Dr. Christia Reading, Ph.D. with consideration to Robert [name]'s current level of functioning, the nature of his symptoms, emotional and behavioral responses during interview, level of literacy, observed level of motivation/effort, and the nature of the referral question. This battery was communicated to the psychometrist. Communication between Dr. Christia Reading, Ph.D. and the psychometrist was ongoing throughout the evaluation and Dr. Christia Reading, Ph.D. was immediately accessible at all times. Dr. Christia Reading, Ph.D. provided supervision to the psychometrist on the date of this service to the extent necessary to assure the quality of all services provided.  ?  ?Robert [name] will return within approximately 1-2 weeks for an interactive feedback session with Dr. Melvyn Novas at which time his test performances, clinical impressions, and treatment recommendations will be reviewed in detail. Robert [name] understands he can contact our office should he require our assistance before this time. ? ?A total of 135 minutes of billable time were spent face-to-face with Robert [name] by the psychometrist. This includes both test administration and scoring time. Billing for these services is reflected in the clinical report generated by Dr. Christia Reading, Ph.D. ? ?This note reflects time spent with the psychometrician and does not include test scores or any clinical interpretations made by Dr. Melvyn Novas. The full report will follow in a separate note.  ?

## 2022-02-08 NOTE — Telephone Encounter (Signed)
Patient called in about Rosuvastatin states that hospital had him on '80mg'$ . Wants to know if you want him on '40mg'$  or '80mg'$  please advise he wants 90 day supply sent to pharmacy. ?

## 2022-02-08 NOTE — Progress Notes (Signed)
? ?NEUROPSYCHOLOGICAL EVALUATION ?[place] ?[place] Department of Neurology ? ?Date of Evaluation: Feb 08, 2022 ? ?Reason for Referral:  ? ?Robert [name] is a 61 y.o. 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  ? ?Assessment and Plan:  ? ?Clinical Impression(s): ?Robert [name]'s pattern of performance is suggestive of a primary deficit in attention/concentration, as well as further performance variability across both processing speed and executive functioning. While he had mild difficulties learning a list of words, his retention percentage was 120% after a delay and he performed strongly across all other memory tasks, thus not suggesting objective memory impairment. Performances were also appropriate relative to age-matched peers across receptive and expressive language and visuospatial abilities. Robert [name] denied difficulties completing instrumental activities of daily living (ADLs) independently. As such, given evidence for cognitive dysfunction described above, he meets criteria for a Mild Neurocognitive Disorder ("mild cognitive impairment"). However, the mild nature of this diagnosis should likely be emphasized at the present time.  ? ?Regarding etiology, prime consideration should be given to a primary vascular etiology (i.e., mild vascular neurocognitive disorder). Per Robert [name]'s timeline, cognitive dysfunction was first noticeable after his bypass procedure and subsequent neuroimaging suggesting small embolic strokes in the left cerebellar hemisphere, left MCA territory, and bilateral PCA territory. In addition to his stroke history, longstanding medical ailments affecting vascular functioning (i.e., hypertension, tachycardia, atrial fibrillation, type II diabetes,  hyperlipidemia, obstructive sleep apnea) can also create and worsen cerebrovascular functioning. Patterns across testing suggesting difficulties with processing speed, attention/concentration, and executive functioning represent the most common findings in primary vascular etiologies. As such, this certainly seems the most likely cause of mild weaknesses.  ? ?Additionally, Robert [name] reported acute symptoms of mild anxiety and moderate depression ongoing during the past 1-2 weeks, as well as moderate sleep dysfunction. During interview, he also reported ongoing chronic pain stemming from upper and lower extremity neuropathy. The combination of psychiatric distress, chronic pain, and sleep dysfunction will also negatively influence cognitive abilities, especially in the areas where Robert [name]'s exhibited weakness across testing. While I believe there to be an underlying primary vascular etiology, these factors are very likely exacerbating ongoing deficits.  ? ?Memory patterns are not concerning for early-onset Alzheimer's disease. Behavioral and cognitive patterns are not concerning for Lewy body disease, frontotemporal lobar degeneration, or a more rare parkinsonian syndrome. Continued medical monitoring will be important moving forward. ? ?Recommendations: ?A repeat neuropsychological evaluation in 24-36 months (or sooner if functional decline is noted) is recommended to assess the trajectory of future cognitive decline should it occur. This will also aid in future efforts towards improved diagnostic clarity. ? ?A combination of medication and psychotherapy has been shown to be most effective at treating symptoms of anxiety and depression. As such, Robert [name] is encouraged to speak with his prescribing physician regarding medication adjustments to optimally manage these symptoms.  ? ?Likewise, Robert [name] is encouraged to consider engaging in short-term psychotherapy to address symptoms of psychiatric distress.  He would benefit from an active and collaborative therapeutic environment, rather than one purely supportive in nature. Recommended treatment modalities include Cognitive Behavioral Therapy (CBT) or Acceptance and Commitment Therapy (ACT). ? ?I would also advise that he discuss ways to improve his sleep with his PCP.  ? ?Robert [name] is encouraged to attend to lifestyle factors for  brain health (e.g., regular physical exercise, good nutrition habits, regular participation in cognitively-stimulating activities, and general stress management techniques), which are likely to have benefits for both emotional adjustment and cognition. In fact, in addition to promoting good general health, regular exercise incorporating aerobic activities (e.g., brisk walking, jogging, cycling, etc.) has been demonstrated to be a very effective treatment for depression and stress, with similar efficacy rates to both antidepressant medication and psychotherapy. Optimal control of vascular risk factors (including safe cardiovascular exercise and adherence to dietary recommendations) is encouraged. Likewise, continued compliance with his BiPAP machine will also be important. Continued participation in activities which provide mental stimulation and social interaction is also recommended.  ? ?When learning new information, he would benefit from information being broken up into small, manageable pieces. He may also find it helpful to articulate the material in his own words and in a context to promote encoding at the onset of a new task. This material may need to be repeated multiple times to promote encoding. ? ?Memory can be improved using internal strategies such as rehearsal, repetition, chunking, mnemonics, association, and imagery. External strategies such as written notes in a consistently used memory journal, visual and nonverbal auditory cues such as a calendar on the refrigerator or appointments with alarm, such as on a cell phone,  can also help maximize recall.   ? ?To address problems with processing speed, he may wish to consider: ?  -Ensuring that he is alerted when essential material or instructions are being presented ?  -Adjusting the speed at which new information is presented ?  -Allowing for more time in comprehending, processing, and responding in conversation ? ?To address problems with fluctuating attention, he may wish to consider: ?  -Avoiding external distractions when needing to concentrate ?  -Limiting exposure to fast paced environments with multiple sensory demands ?  -Writing down complicated information and using checklists ?  -Attempting and completing one task at a time (i.e., no multi-tasking) ?  -Verbalizing aloud each step of a task to maintain focus ?  -Reducing the amount of information considered at one time ? ?Review of Records:  ? ?Robert [name] was seen by [facility] Metta Clines, D.O.) on 12/04/2021 for an evaluation. Briefly, following CABG x1 and aortic valve replacement on 10/05/2021, Robert [name] noted being unable to lift his right leg. There was a history of right lower extremity weakness and pain following renal transplant in 2018 but his leg was now almost plegic. Brain MRI on 10/11/2021 revealed scattered acute and subacute small infarcts in multiple vascular territories including left cerebellar hemisphere, left MCA territory, and bilateral PCA territory. 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 The intra-operative TEE on 10/05/2021 revealed no evidence of thrombus. He did develop post-operative valvular atrial fibrillation and was started on warfarin. To evaluate for possible radiculopathy, he also had MRI of the lumbar spine which revealed multilevel degenerative disc and endplate disease with mild spinal and left lateral recess stenosis at L1-2, moderate right neural foraminal stenosis at L3 and bilateral  L4 nerve levels and moderate to severe neural foraminal stenosis at the bilateral L5 nerve levels. He was discharged to a SNF.   ?  ?He was readmitted to the hospital on 10/21/2021 for hypertensive urg

## 2022-02-09 ENCOUNTER — Ambulatory Visit (HOSPITAL_COMMUNITY)
Admission: RE | Admit: 2022-02-09 | Discharge: 2022-02-09 | Disposition: A | Discharge status: Home / Self Care | Payer: 59 | Source: Ambulatory Visit | Attending: Internal Medicine | Admitting: Internal Medicine

## 2022-02-09 DIAGNOSIS — R2231 Localized swelling, mass and lump, right upper limb: Secondary | ICD-10-CM | POA: Insufficient documentation

## 2022-02-09 MED ORDER — ROSUVASTATIN CALCIUM 20 MG PO TABS: 20.0000 mg | ORAL_TABLET | Freq: Every day | ORAL | 3 refills | Status: DC

## 2022-02-09 NOTE — Telephone Encounter (Signed)
90 day '20mg'$  has been sent to pharmacy. ?

## 2022-02-09 NOTE — Telephone Encounter (Signed)
Mychart message sent to patient as well.

## 2022-02-15 ENCOUNTER — Ambulatory Visit (INDEPENDENT_AMBULATORY_CARE_PROVIDER_SITE_OTHER): Payer: 59 | Admitting: Psychology

## 2022-02-15 DIAGNOSIS — I6310 Cerebral infarction due to embolism of unspecified precerebral artery: Secondary | ICD-10-CM

## 2022-02-15 DIAGNOSIS — I999 Unspecified disorder of circulatory system: Secondary | ICD-10-CM

## 2022-02-15 DIAGNOSIS — F0670 Mild neurocognitive disorder due to known physiological condition without behavioral disturbance: Secondary | ICD-10-CM | POA: Diagnosis not present

## 2022-02-15 NOTE — Progress Notes (Signed)
? ?  Neuropsychology Feedback Session ?[place] ?[place] Department of Neurology ? ?Reason for Referral:  ? ?Robert [name] is a 61 y.o. 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  ? ?Feedback:  ? ?Robert [name] completed a comprehensive neuropsychological evaluation on 02/08/2022. Please refer to that encounter for the full report and recommendations. Briefly, results suggested a primary deficit in attention/concentration, as well as further performance variability across both processing speed and executive functioning. While he had mild difficulties learning a list of words, his retention percentage was 120% after a delay and he performed strongly across all other memory tasks, thus not suggesting objective memory impairment. Regarding etiology, prime consideration should be given to a primary vascular etiology (i.e., mild vascular neurocognitive disorder). Per Robert [name]'s timeline, cognitive dysfunction was first noticeable after his bypass procedure and subsequent neuroimaging suggesting small embolic strokes in the left cerebellar hemisphere, left MCA territory, and bilateral PCA territory. In addition to his stroke history, longstanding medical ailments affecting vascular functioning (i.e., hypertension, tachycardia, atrial fibrillation, type II diabetes, hyperlipidemia, obstructive sleep apnea) can also create and worsen cerebrovascular functioning. Patterns across testing suggesting difficulties with processing speed, attention/concentration, and executive functioning represent the most common findings in primary vascular etiologies. As such, this certainly seems the most likely cause of mild weaknesses.  ? ?Robert [name] was accompanied by his wife during the current  feedback session. Content of the current session focused on the results of his neuropsychological evaluation. Robert [name] was given the opportunity to ask questions and his questions were answered. He was encouraged to reach out should additional questions arise. A copy of his report was provided at the conclusion of the visit.  ? ?  ? ?25 minutes were spent conducting the current feedback session with Robert [name], billed as one unit [phone].  ?

## 2022-03-01 ENCOUNTER — Encounter: Payer: Self-pay | Admitting: Endocrinology

## 2022-03-01 DIAGNOSIS — E1165 Type 2 diabetes mellitus with hyperglycemia: Secondary | ICD-10-CM

## 2022-03-01 DIAGNOSIS — E291 Testicular hypofunction: Secondary | ICD-10-CM

## 2022-03-02 ENCOUNTER — Other Ambulatory Visit: Payer: Self-pay | Admitting: Endocrinology

## 2022-03-02 DIAGNOSIS — E291 Testicular hypofunction: Secondary | ICD-10-CM

## 2022-03-02 MED ORDER — "NEEDLE (DISP) 22G X 1"" MISC": 1 refills | Status: DC

## 2022-03-02 MED ORDER — "SYRINGE 18G X 1-1/2"" 3 ML MISC": 0 refills | Status: DC

## 2022-03-02 MED ORDER — BD PEN NEEDLE MINI U/F 31G X 5 MM MISC: 2 refills | Status: DC

## 2022-03-12 ENCOUNTER — Ambulatory Visit (INDEPENDENT_AMBULATORY_CARE_PROVIDER_SITE_OTHER): Payer: 59 | Admitting: Cardiovascular Disease

## 2022-03-12 ENCOUNTER — Encounter: Payer: Self-pay | Admitting: Cardiovascular Disease

## 2022-03-12 DIAGNOSIS — Z951 Presence of aortocoronary bypass graft: Secondary | ICD-10-CM | POA: Diagnosis not present

## 2022-03-12 DIAGNOSIS — E785 Hyperlipidemia, unspecified: Secondary | ICD-10-CM

## 2022-03-12 DIAGNOSIS — Z9989 Dependence on other enabling machines and devices: Secondary | ICD-10-CM

## 2022-03-12 DIAGNOSIS — Z7901 Long term (current) use of anticoagulants: Secondary | ICD-10-CM

## 2022-03-12 DIAGNOSIS — I10 Essential (primary) hypertension: Secondary | ICD-10-CM | POA: Diagnosis not present

## 2022-03-12 DIAGNOSIS — Z952 Presence of prosthetic heart valve: Secondary | ICD-10-CM | POA: Diagnosis not present

## 2022-03-12 DIAGNOSIS — Z940 Kidney transplant status: Secondary | ICD-10-CM

## 2022-03-12 DIAGNOSIS — I639 Cerebral infarction, unspecified: Secondary | ICD-10-CM

## 2022-03-12 DIAGNOSIS — E118 Type 2 diabetes mellitus with unspecified complications: Secondary | ICD-10-CM

## 2022-03-12 DIAGNOSIS — G4733 Obstructive sleep apnea (adult) (pediatric): Secondary | ICD-10-CM

## 2022-03-12 DIAGNOSIS — Z794 Long term (current) use of insulin: Secondary | ICD-10-CM

## 2022-03-12 MED ORDER — AMLODIPINE BESYLATE 5 MG PO TABS: 5.0000 mg | ORAL_TABLET | Freq: Every day | ORAL | 3 refills | Status: DC

## 2022-03-12 NOTE — Patient Instructions (Signed)
Medication Instructions:  RESUME amlodipine --take 2.5 mg (1/2 tablet) daily for the next 2 weeks. --if blood pressure > 001 systolic (top number), increase to 5 mg daily  *If you need a refill on your cardiac medications before your next appointment, please call your pharmacy*  Follow-Up: At [facility], you and your health needs are our priority.  As part of our continuing mission to provide you with exceptional heart care, we have created designated Provider Care Teams.  These Care Teams include your primary Cardiologist (physician) and Advanced Practice Providers (APPs -  Physician Assistants and Nurse Practitioners) who all work together to provide you with the care you need, when you need it.  We recommend signing up for the patient portal called "MyChart".  Sign up information is provided on this After Visit Summary.  MyChart is used to connect with patients for Virtual Visits (Telemedicine).  Patients are able to view lab/test results, encounter notes, upcoming appointments, etc.  Non-urgent messages can be sent to your provider as well.   To learn more about what you can do with MyChart, go to [email or web address].    Your next appointment:   4 month(s)  The format for your next appointment:   In Person  Provider:   Shelva Majestic, MD {    Other Instructions You have been referred to: clinical social work   Important Information About Sugar

## 2022-03-12 NOTE — Progress Notes (Signed)
Patient ID: Robert [name], male   DOB: 19-Apr-1961, 61 y.o.   MRN: [id number]     HPI: Robert [name], is a 61 y.o. male who is a former patient of Dr. Terance Ice.  He presents for follow-up evaluation with me following his AVR/CABG on October 05, 2021 and subsequent January 2023 hospitalization.  Robert [name]  has a complex medical history that includes a congenital nonfunctioning left kidney for which he underwent left nephrectomy at age 32. He has a history of obesity, diabetes mellitus, and has been demonstrated to have proteinuria of his remaining single kidney, being status post open renal biopsy in [name] in November 2010. This revealed focal segmental glomerular sclerosis. At that time he was seen by Dr. Venita Sheffield in the nephrology department at [facility]. He sees Dr. Moshe Cipro in [place] for his kidney insufficiency and has been on ARB therapy in the past in an attempt to reduce proteinuria. He sees Dr. Dwyane Dee for his diabetes mellitus. He also has a history of hypothyroidism, hyperlipidemia, anxiety and depression. He has a history of prior supraventricular tachycardia requiring hospitalization in 2007 as was felt to be possibly AV nodal reentrant rhythm.  He has a history of complex sleep apnea initially diagnosed in 2007 with an AHI at that time of 56.7 per hour and during REM sleep this increased to 65.7 per hour. At that time he dropped his oxygen 84% and had loud snoring. He had been on BiPAP therapy. He underwent a 5 year followup split-night study in 2012 which again confirmed severe sleep apnea with an AHI of 60.7 on the baseline portion of the study. He was unable to achieve REM sleep at that time. Oxygen saturation during non-REM sleep at 83%. He was titrated utilizing BiPAP up to a setting of 16/12 cm water pressure. He does admit to continued residual daytime sleepiness despite using his BiPAP therapy.  An echo Doppler study in  August 2012 showed mild concentric LVH with normal systolic function and grade 1 diastolic dysfunction. He had trace mitral and tricuspid insufficiency as well as pulmonic insufficiency. A nuclear perfusion study in August 2012 showed normal perfusion.  Robert [name] was on dialysis Monday, [name].  He is on the [place] transplant list.  In January 2017 he had an stress echo at [facility] as part of his transplant evaluation.  I do not have the results of this evaluation.  He has had some difficulty with a steal syndrome in his right hand since he had AV fistulas inserted in the forearm and most recently now in the upper arm.  He continues to use BiPAP with 100% compliance.  He had a [name].  He obtains his CPAP supplies online because they are cheaper, but had seen [facility].  He is using a Art therapist full face mask.  A download in the office from 05/28/2016 through 07/26/2016.  He is meeting compliance standards with usage at 85% usage stays greater than 4 hours at 77%.  He is averaging 6 hours and 43 minutes per night.  He is set at an IPAP pressure of 16 and EPAP pressure of 12.  His AHI was mildly increased at 6.5 with 5.2 per hour of apneic events and 1.3 of hypopnea events.  When I last saw him he had stopped taken Bystolic since his blood pressure had become low at the end of dialysis.  He had been on 5 mg on nondialysis  days.  He tells me his aspirin was discontinued because he was continuously having issues with bleeding following his dialysis when the catheter was removed.   He had an episode of SVT in December 2017, which was successfully aborted with vagal maneuvers.  In the emergency room, he had 2-3 more recurrences of SVT, of which, again, all aborted with vagal maneuvers.  He was started on verapamil and discharge from the hospital.  He has seen Dr. Curt Bears and had an EP study with attempted ablation, but apparently he was not able to be induced and  ablation was not performed.   He denies any recurrent episodes of SVT.  Remotely, he may of had some mild nonexertional musculoskeletal discomfort but has not experienced any of this recently.  Reportedly, his remote stress echo was negative for ischemia, although mild ST changes were noted in recovery.  He continues to be asymptomatic.  He underwent a kidney transplant from a living donor on 02/25/2017 at [facility].  He tolerated surgery well but has developed a postoperative inguinal hernia, which will ultimately need to be surgically repaired.  He received a new BiPAP machine from [facility] 2 weeks ago.  He has a full face mask, but admits to increased leak.    Since I saw him in September 2018, he had 2 hospital stays at [facility].  He developed intestinal bleeding due to diverticular disease and also had some dehydration issues.  He also underwent inguinal hernia surgery.  He denies any recent chest pain or palpitations.  He continues to use BiPAP with 100% compliance.  However, he did not have his machine with him for the days he was in the hospital.  He brought his machine with him today for interrogation.  Over the past month, usage hours and 6 hours and 32 minutes per night with an HI of 0.9.  He used to machine 27 of 30 nights nights that were not used was because he was in the hospital.  Usage greater than 4 hours was 26 of 30.  He is on a BiPAP pressure of 18/13 0.9.  Recently he has noticed a mild leak.  AHI was 5.6 over the past 7 days.  He has a ResMed fullface mask, Mirage Quatro mask.    When I saw him in November 2018, he was feeling well. He is seeing Dr. Azzie Glatter at [facility] from a nephrology standpoint.  He denies any episodes of chest pain or awareness of recurrent arrhythmia.  He admits to 100% use with CPAP.  [name] is his DME company.  He has a ResMed air 10 V auto unit with an EPAP pressure 14.  An IPAP pressure set at 18.  A download from 01/14/2017 through 02/12/2018 shows  100% compliance.  He is averaging 8 hours and 7 minutes of sleep.  AHI is excellent at 2.6.    I saw him in May 2019 and since that time, he has continued to follow at [facility]. He was last evaluated by me in a telemedicine visit in April 2020.  At that time he had noticed that over the past year  his blood pressure had increased and his lisinopril dose was increased to 20 mg and amlodipine reduced to 5 mg.  His weight had fluctuated and has increased from 293 to 303.  He has continued to use BiPAP with 100% compliance.  I was able to obtain a download today from March 23 through January 27, 2019.  He is 100% compliant  with average BiPAP use of 7 hours and 10 minutes.  At his 18/14 pressure, AHI remains excellent at 2.6/h.  Over the past 6 months he has changed jobs twice.  He is been staying home particularly with his significant comorbidities which can be affected by the coronavirus including hypertension, diabetes mellitus, obesity, renal disease, and immunosuppression.  He walks approximately 2 times a day for short duration.  He receives immunosuppressant infusion with [employer] and is scheduled for his next infusion: On Feb 12, 2019.  His last lipid panel was in October 2019 with total cholesterol 137 and LDL cholesterol 73.  He is scheduled to see his nephrologist in August.    I saw him in the office in February 2021 and since his  telemedicine evaluation in April 2020, he continued to be followed closely at [facility].  Creatinine had increased to 1.74.  He has noticed blood pressure elevation.  He has not been successful with weight loss.  He has been without anginal symptoms or awareness of recurrent SVT.  He has continued to use BiPAP therapy.  A download from 10/22/2019 through 11/20/1999 shows 93% of usage days.  Average use is 8 hours and 10 minutes.  At his pressure of 18/14, AHI is excellent at 1.5.  There is no mask leak.  During his February 2021 evaluation he had a 2/6 murmur consistent with aortic stenosis  and I recommended follow-up echo Doppler evaluation.   He underwent an echo Doppler study on December 08, 2019 which showed an EF of 65 to 70%, moderate left ventricular hypertrophy and grade 1 diastolic dysfunction.  There was mild mitral valve regurgitation.  There was moderate to severe aortic stenosis with a mean gradient of 39 peak gradient of 58.   He was evaluated in a telemedicine visit in June 2021.  At that time he denied any chest pain, presyncope or syncope.  He continues to go to [facility] for nephrology follow-up.  He states his blood pressure is stable every morning upon awakening but as a day progresses it may go up slightly.  He denies any exertional shortness of breath.  At times he has noticed some mild lower extremity edema.  He was evaluated at [facility] in July 2021 and was hospitalized overnight with right lower quadrant pain around his renal allograft.  A CT scan demonstrated normal-appearing transplant kidney without any acute abdominal/pelvic abnormalities however, significant stool burden was noted on CT suggesting moderate to severe constipation.  I last saw him in December 2021 at which time he denied any chest pain but had experienced some mild shortness of breath particularly with walking up steps.Marland Kitchen He is now on insulin pump.  He has noticed some lower extremity edema.  He continued to use BiPAP.  A download was obtained in the office today from November 7 through September 12, 2020 which shows excellent compliance.  There were only 2 days of nonuse when he never went to bed and it stayed in a recliner.  He is on BiPAP with a pressure of 18/14.  He is averaging 8 hours and 54 minutes of BiPAP use per night.  AHI is excellent at 2.0.  He does have mask leak and has been noticing a leak in the region of his chin.    I recommended he have a follow-up echo Doppler study which was done on November 16, 2020.  This showed continued hyperdynamic LV function with EF at 65 to 70% and moderate LVH with  grade 2 diastolic  dysfunction.  RV size was normal.  Moderately elevated pulmonary artery systolic pressure at 45 mm.  There was mild mitral stenosis with a mean valve gradient of 3.2 mm with moderately severe mitral annular calcification.  There was moderate calcification of his trileaflet aortic valve.  He was felt to have moderate aortic stenosis with a mean gradient of 34.6 and peak gradient of 57 with calculated valve area 1.05 cm.  I saw him on December 01, 2020 at which time he continued to feel well but was experiencing some some leg edema and  shortness of breath with activity.  He is followed closely at [facility] for his renal transplant.  Most recent creatinine on October 27, 2020 was further increased at 1.89.  Hemoglobin A1c was 6.9. He denies palpitations.  He continues to use BiPAP.  I obtained a new download in the office today from January 25 through November 30, 2020.  Compliance is excellent with average use at 10 hours and 16 minutes per night.  At his BiPAP pressure of 18/14, AHI is 2.3.  There are nights with significant mask leak.  During that evaluation, I recommended that he undergo a follow-up echo Doppler study for reassessment of his aortic valve which may be progressing and it stenosis.  When I saw him on August 9,2022 he had recently noticed some slight increase shortness of breath with exertion.  He denied any presyncope or syncope.  However at times when his blood pressure gets too low he does note transient dizziness.  He admitted to swelling in his lower extremities right leg greater than left and has taken as needed furosemide but has not taken this recently.  He wass scheduled to undergo a renal transplant reevaluation in October at [facility].  He denied any chest pain.  He denies any palpitations.  He underwent an echo Doppler study on May 02, 2021.  EF remained stable at 60 to 65%.  There was moderate left ventricular hypertrophy and there was grade 2 diastolic dysfunction.  There  was moderate left atrial dilatation.  There was moderate calcification of a trileaflet aortic valve with moderately severe aortic stenosis.  The mean gradient had now increased to 44.4 and the peak instantaneous gradient to 64 mmHg.  The aortic valve area (VTI) was 0.81 cm.  There was mild dilation of the ascending aorta at 42 mm.  He continues to use BiPAP with 100% compliance.  A new download was obtained from July 10 through May 15, 2021 and average use is 8 hours and 15 minutes per night.  His BiPAP settings are 18/14 and AHI is excellent at 1.6/h.  During that evaluation, I discussed with him that with his progressive aortic valve stenosis he may ultimately require definitive cardiac catheterization and will need extensive CT imaging for potential candidacy for TAVR.  At that time I discussed with him the importance of being seen by his [facility] transplant team.  I also suggested he wear support stockings at least 20 to 30 mg and as needed to take furosemide 20 mg possibly daily or every other 2 to 3 days but depending upon his leg swelling.  I  saw him on July 11, 2021 and since his prior evaluation he had contacted [facility] verbally since I saw him, he had apparently contacted [facility] verbally and was told that if catheterization was necessary he would need to undergo go adequate hydration.  Apparently, he subsequently developed a cough on September 24 through 25 that was productive with some  purulent sputum.  He was seen by Dr. Osborne Casco.  A chest x-ray reportedly suggested bilateral pneumonia.  He tested negative for COVID and flu.  He was empirically started on cefdinir yesterday for a 10-day course of antibiotics.  He underwent follow-up laboratory on July 04, 2021.  His creatinine had risen to 1.93 on July 04, 2021 which had increased from 1.77 on March 23, 2021 and from 1.69 on December 13, 2020.  During that evaluation, I stressed to him that I wanted to be seen in person at  [facility] rather than a virtual  visit.  He underwent follow-up laboratory at [facility] on July 18, 2021 and his creatinine now had increased to 2.2 with estimated GFR of 33 consistent with stage IIIb CKD, gradually progressing towards stage IV.  He was evaluated at the time by Adrienne Mocha, PA and his October 11 creatinine was not yet available  and it was felt that his creatinine was stable at 1.7.   I saw him on August 01, 2021 at which time he denied any chest tightness or pressure.  He does have fatigability and low stamina.  He is to undergo a monthly antirejection infusion with [facility] at the [facility] infusion center and for that he will have a follow-up bmet.  I have also asked that he personally contact his [facility] following this regarding his progressive increase in creatinine and potential risk associated with future catheterization if necessary.  He continues to use BiPAP and a download was obtained in the office  from September 25 through July 31, 2021.  Compliance is excellent with average use at 8 hours and 38 minutes.  At his 18/14 centimeters water pressure AHI is excellent at 1.8.  With stabilization of his renal function, I performed right and left heart catheterization on August 22, 2021.  He was found to have 80% ostial proximal LAD stenosis followed by 90% proximal to mid LAD stenosis.  The circumflex vessel had a large second marginal vessel with 50% stenosis in the RCA had 80% mid PDA stenosis with 80% lesion in the AV groove portion before few small posterolateral branches.  Right heart catheterization revealed PA pressure 41/11 with a mean right atrial pressure 4.  Mean aortic gradient was 40 mm with a peak to peak gradient at 42 mm and aortic valve area of 0.95 cm.  Following his cardiac catheterization, he was seen by Roby Lofts, PA in the office.  I scheduled him for surgical consultation with Dr. Thurnell Lose who saw him on September 11, 2021.  He was felt to have stage D, severe,  symptomatic aortic stenosis with New York Heart Association class III symptoms with shortness of breath consistent with chronic diastolic congestive heart failure.  With his severe three-vessel CAD and aortic stenosis he concurred that aortic valve replacement using a bioprosthetic valve and coronary artery bypass graft surgery will be his best treatment options.  He felt the symptoms are most likely related to the combination of severe aortic stenosis and significant CAD.  He was given tentative dates to schedule surgery after Christmas in late December and in early January.    He underwent successful aortic valve replacement using a 27 mm Edwards Inspiris Resilia pericardial valve and CABG x1 with a LIMA graft placed to his LAD by Dr. Cyndia Bent on October 05, 2021.  He was on Coumadin given postoperative atrial fibrillation and possible embolic stroke.  He was rehospitalized in January 2023 with dizziness, nausea and elevated blood pressure increasing  to 204/119.  Medications were adjusted.  He was readmitted and treated for acute pyelonephritis in early February 2023.  At that time lisinopril was discontinued.  He was evaluated by Caron Presume, PA-C on December 28, 2021 at which time he denied chest pain, shortness of breath PND orthopnea and was not having any palpitations since his December discharge.  Presently, he is back at work virtually.  At times he has experienced occasional double vision.  He denies recurrent chest pain, presyncope or syncope.  He is on Eliquis for anticoagulation and is on carvedilol 25 mg twice a day, doxazosin 1 mg daily, hydralazine 100 mg every 8 hours, HCTZ 12.5 mg, in addition to his insulin, levothyroxine, and he continues to take prednisone 5 mg and rosuvastatin 20 mg in addition to Cymbalta 60 mg daily.  He continues to use BiPAP with 100% compliance. He presents for evaluation.  Past Medical History:  Diagnosis Date   Acute cystitis without hematuria 11/09/2021   AKI  (acute kidney injury) 08/08/2017   Elevated sCr to 2.5 (baseline ~1.7) noted at OSH - Subsequent transfer to [facility]  - Currently on IVMF in the setting of diarrhea and AKI - sCr at baseline at time of discharge   Anemia in chronic kidney disease 03/30/2015   Anticoagulated on Coumadin 11/09/2021   Aortic valve stenosis    Arthritis    Atrial fibrillation 11/29/2021   Benign essential hypertension 07/30/2014   prior to transplant pt took Metorpolol tartrate 50mg  qd on off HD days   Benign neoplasm of transverse colon    Cancer    skin ca right shoulder, plastic dsyplasia, pre-Ca polpys removed on Colonoscopy- 07/2014   Cerebrovascular accident (CVA) due to embolism of precerebral artery    Chronic renal failure    Colon polyps 07/23/2014   Tubular adenomas x 5   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   Diverticulosis of colon without hemorrhage    Eczema    ESRD (end stage renal disease)    hemodialysis 06/2014-02/2017, s/p living unrelated kidney transplant 02/26/17   Exertional dyspnea    Fatigue 01/08/2014   Fissure of skin 03/08/2020   GI bleed 07/26/2017   Headache    Heart murmur    aortic stenosis (moderate-severe 04/2021)   History of COVID-19 2023   History of unilateral nephrectomy 09/06/2013   Hydrocele, acquired 04/07/2017   Hyperlipidemia 09/06/2013   Hypertensive urgency 10/21/2021   Hyponatremia 11/09/2021   Hypothyroidism    Immunosuppressed status 02/26/2017   Induction agent: Solumedrol - Envarsus 3mg  daily  - Home dose decreased from 5mg  to 3mg  in the setting of FK 15.7 while inpatient   - Previously on Prograf, though d/c'ed during previous admission; suspected this may be causing hallucinations  - Cellcept 1000mg  Q12H  - Prednisone 5mg   daily   Inguinal hernia without obstruction or gangrene 06/30/2017   Right inguinal wall defect appreciated by CT imaging [place] and on exam Dr. Paulla Fore today Dr. Paulla Fore has discussed hernia repair with mesh at some point in near future  Last Assessment & Plan:  Formatting of this note might be different from the original. Right inguinal wall defect appreciated by CT imaging [place] and on exam Dr. Paulla Fore today Dr. Paulla Fore has discussed hern  Kidney transplanted 02/26/2017   Date of Transplant = 02/26/17  ABO (Donor/Recipient) = compatible Blood type: O+/A+ DonorType: Living donor, unrelated Allograft type:Left kidney Donor anatomy: 2 arteries, 1 veins and single ureter  Pants-to-skirt anastomosis of dual renal arteries on the backtable. Donor Kidney Bx: Not applicable Allograft injury/complications: None Ne   Lower extremity edema 09/06/2013   Macular degeneration    Mild vascular neurocognitive disorder 02/08/2022   OAB (overactive bladder) 12/28/2019   Obstructive sleep apnea 09/06/2013   On Bi-PAP  Last Assessment & Plan:  Formatting of this note might be different from the original. - History of OSA, continue CPAP at night   Peritoneal dialysis catheter in place 02/24/2015   RLQ abdominal pain 04/19/2020   S/P AVR (aortic valve replacement) 10/05/2021   SBO (small bowel obstruction) 05/2015   Scrotal edema 03/14/2017   Testicular ultrasound did show hydrocele  Will refer to urology regarding ongoign swelling, hydrocele.   Last Assessment & Plan:  Formatting of this note might be different from the original.   Testicular ultrasound did show hydrocele  Will refer to urology regarding ongoign swelling, hydrocele.   Shortness of breath    with exertion   Stage 3b chronic kidney disease (CKD) 03/22/2014   SVT (supraventricular tachycardia) 09/06/2013   Tremor of both hands 06/29/2017   Last Assessment & Plan:  Present since transplant, stable and unchanged Query from tacrolimus -Improved  tremors noted and verbalized by pt - Prograf doses titrated per FK levels  Last Assessment & Plan:  Formatting of this note might be different from the original. Present since transplant, stable and unchanged Query from tacrolimus -Improved tremors noted and verbalized by pt - Prograf doses titr   Type 2 diabetes mellitus with hyperglycemia, with long-term current use of insulin 49/44/9675   Umbilical hernia    s/p repair 04/28/15    Past Surgical History:  Procedure Laterality Date   AORTIC VALVE REPLACEMENT N/A 10/05/2021   Procedure: AORTIC VALVE REPLACEMENT (AVR) WITH INSPIRIS RESILIA AORTIC VALVE SIZE 27MM;  Surgeon: [name], MD;  Location: [place];  Service: Open Heart Surgery;  Laterality: N/A;   AV FISTULA PLACEMENT Right 02/25/2015   Procedure: RIGHT ARTERIOVENOUS (AV) FISTULA CREATION;  Surgeon: Serafina Mitchell, MD;  Location: [place];  Service: Vascular;  Laterality: Right;   AV FISTULA PLACEMENT Right 09/15/2015   Procedure: ARTERIOVENOUS (AV) FISTULA CREATION- RIGHT ARM;  Surgeon: Serafina Mitchell, MD;  Location: [place];  Service: Vascular;  Laterality: Right;   CAPD INSERTION N/A 05/18/2014   Procedure: LAPAROSCOPIC INSERTION CONTINUOUS AMBULATORY PERITONEAL DIALYSIS  (CAPD) CATHETER;  Surgeon: Ralene Ok, MD;  Location: [place];  Service: General;  Laterality: N/A;   COLONOSCOPY     COLONOSCOPY WITH PROPOFOL N/A 07/12/2016   Procedure: COLONOSCOPY WITH PROPOFOL;  Surgeon: [name], MD;  Location: [facility];  Service: Endoscopy;  Laterality: N/A;   CORONARY ARTERY BYPASS GRAFT N/A 10/05/2021   Procedure: CORONARY ARTERY BYPASS GRAFTING (CABG) X 1, ON PUMP, USING LEFT INTERNAL MAMMARY ARTERY CONDUIT;  Surgeon: [name], MD;  Location: [place];  Service: Open Heart Surgery;  Laterality: N/A;   declotting of fistula  08/2015   ESOPHAGOGASTRODUODENOSCOPY (EGD) WITH PROPOFOL N/A 07/12/2016   Procedure: ESOPHAGOGASTRODUODENOSCOPY (EGD) WITH PROPOFOL;  Surgeon: [name], MD;  Location: [facility];  Service: Endoscopy;  Laterality: N/A;   FISTULA SUPERFICIALIZATION Right 02/07/2016   Procedure: RIIGHT UPPER ARM FISTULA SUPERFICIALIZATION;  Surgeon: Elam Dutch, MD;  Location: [facility];  Service: Vascular;  Laterality: Right;   IJ catheter insertion     INSERTION OF DIALYSIS CATHETER Right 02/25/2015   Procedure: INSERTION OF RIGHT INTERNAL JUGULAR DIALYSIS CATHETER;  Surgeon: Serafina Mitchell, MD;  Location: [place];  Service: Vascular;  Laterality: Right;   KIDNEY TRANSPLANT     LAPAROSCOPIC REPOSITIONING CAPD CATHETER N/A 06/16/2014   Procedure: LAPAROSCOPIC REPOSITIONING CAPD CATHETER;  Surgeon: Ralene Ok, MD;  Location: [place];  Service: General;  Laterality: N/A;   LAPAROSCOPY N/A 05/04/2015   Procedure: LAPAROSCOPY DIAGNOSTIC LYSIS OF ADHESIONS;  Surgeon: Coralie Keens, MD;  Location: [place];  Service: General;  Laterality: N/A;   MINOR REMOVAL OF PERITONEAL DIALYSIS CATHETER N/A 04/28/2015   Procedure:  REMOVAL OF PERITONEAL DIALYSIS CATHETER;  Surgeon: Coralie Keens, MD;  Location: [place];  Service: General;  Laterality: N/A;   NEPHRECTOMY Left 1974   RENAL BIOPSY Right 2012   REVISON OF ARTERIOVENOUS FISTULA Right 05/26/2015   Procedure: SUPERFICIALIZATION OF ARTERIOVENOUS FISTULA WITH SIDE BRANCH LIGATIONS;  Surgeon: Serafina Mitchell, MD;  [facility];  Service: Vascular;  Laterality: Right;   RIGHT/LEFT HEART CATH AND CORONARY ANGIOGRAPHY N/A 08/22/2021   Procedure: RIGHT/LEFT HEART CATH AND CORONARY ANGIOGRAPHY;  Surgeon: Troy Sine, MD;  Location: [place] [facility];  Service: Cardiovascular;  Laterality: N/A;   SKIN CANCER EXCISION     right shoulder   SVT ABLATION N/A 11/23/2016   Procedure: SVT Ablation;  Surgeon: Will Meredith Leeds, MD;  Location: [place] [facility];  Service: Cardiovascular;  Laterality: N/A;   TEE WITHOUT CARDIOVERSION N/A 10/05/2021   Procedure: TRANSESOPHAGEAL ECHOCARDIOGRAM (TEE);  Surgeon: [name], MD;  Location: [place];  Service: Open Heart Surgery;  Laterality: N/A;   UMBILICAL HERNIA REPAIR N/A 05/18/2014   Procedure: HERNIA REPAIR UMBILICAL ADULT;  Surgeon: Ralene Ok, MD;  Location: [place];  Service: General;  Laterality: N/A;   UMBILICAL HERNIA REPAIR N/A 04/28/2015   Procedure: UMBILICAL HERNIA REPAIR WITH MESH;  Surgeon: Coralie Keens, MD;  Location: [place];  Service: General;  Laterality: N/A;    Left nephrectomy in 1974.  No Known Allergies  Current Outpatient Medications  Medication Sig Dispense Refill   acetaminophen (TYLENOL) 325 MG tablet Take 2 tablets (650 mg total) by mouth every 4 (four) hours as needed for moderate pain. (Patient not taking: Reported on 03/15/2022)     amLODipine (NORVASC) 5 MG tablet Take 1 tablet (5 mg total) by mouth daily. (Patient not taking: Reported on 03/15/2022) 90 tablet 3   apixaban (ELIQUIS) 5 MG TABS tablet Take 1 tablet (5 mg total) by mouth 2 (two) times daily. Stop coumadin, monitor the PT/INR, and start Eliquis when the INR is <2. 180 tablet 3   BD DISP NEEDLE 23G X 1" MISC USE TO INJECT TESTOSTERONE MEDICATION 100 each 1   belatacept (NULOJIX) 250 MG SOLR injection Inject 24.5 mLs into the vein every 28 (twenty-eight) days.      carvedilol (COREG) 25 MG tablet Take 1 tablet (25 mg total) by mouth 2 (two) times daily with a meal.     cloNIDine (CATAPRES) 0.1 MG tablet Take 0.1 mg by mouth every 8 (eight) hours as needed (SBP >160). (Patient not taking: Reported on 03/15/2022)     Continuous Blood Gluc Sensor (FREESTYLE LIBRE 3 SENSOR) MISC SMARTSIG:1 Topical Every 2 Weeks     doxazosin (CARDURA) 2 MG tablet Take 0.5 tablets (1 mg total) by mouth daily. Take  0.5 Tablet Daily 45 tablet 3   DULoxetine (CYMBALTA) 60 MG capsule Take 60 mg by mouth daily.     furosemide (LASIX) 20 MG tablet Take 20 mg by mouth as needed. (Patient not taking: Reported on 03/15/2022)     hydrALAZINE (APRESOLINE) 100 MG tablet TAKE 1 TABLET BY MOUTH EVERY 8  HOURS. 90 tablet 3   hydrochlorothiazide (MICROZIDE) 12.5 MG capsule Take 12.5 mg by mouth daily.     insulin aspart (NOVOLOG) 100 UNIT/ML injection Inject into the skin 3 (three) times daily before meals.     Insulin Pen Needle (B-D UF III MINI PEN NEEDLES) 31G X 5 MM MISC Use to inject insulin 4 times a day 300 each 2   levothyroxine (SYNTHROID) 112 MCG tablet Take 1 tablet (112 mcg total) by mouth daily. 90 tablet 3   mycophenolate (CELLCEPT) 250 MG capsule Take 1,000 mg by mouth every 12 (twelve) hours.      NON FORMULARY CPAP/BIPAP  at bedtime  BIPAP per pt     predniSONE (DELTASONE) 5 MG tablet Take 5 mg by mouth daily with breakfast. Continuously     rosuvastatin (CRESTOR) 20 MG tablet Take 1 tablet (20 mg total) by mouth daily. 90 tablet 3   senna-docusate (SENOKOT-S) 8.6-50 MG tablet Take 1 tablet by mouth at bedtime as needed for mild constipation.     traZODone (DESYREL) 50 MG tablet Take 50 mg by mouth See admin instructions. As needed at bedtime for insomnia x 14 days     zolpidem (AMBIEN) 5 MG tablet Take 5 mg by mouth at bedtime as needed for sleep. (Patient not taking: Reported on 03/15/2022)     nitroGLYCERIN (NITROSTAT) 0.4 MG SL tablet Place 1 tablet (0.4 mg total) under the tongue every 5 (five) minutes as needed for chest pain. 90 tablet 3   traMADol (ULTRAM) 50 MG tablet Take 50 mg by mouth every 4 (four) hours as needed. (Patient not taking: Reported on 03/15/2022)     No current facility-administered medications for this visit.   Social history is notable in that he is married for over 30 years. His 3 children. He has a degree in chemistry. 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 He does drink occasional alcohol. He has smoked cigars.  ROS General: Negative; No fevers, chills, or night sweats; positive for morbid obesity HEENT: Negative; No changes in vision or hearing, sinus congestion, difficulty swallowing Pulmonary: Recent cough with some purulent sputum, without  fever, treated for bilateral pneumonia by Dr. 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  PE BP (!) 160/70 (BP Location: Left Arm)   Pulse 73   Ht $R'5\' 11"'ol$  (1.803 m)   Wt 268 lb 6.4 oz (121.7 kg)   SpO2 96%   BMI 37.43 kg/m    Left arm blood pressure 138/77;  he has a right upper arm fistula.  Wt Readings from Last 3 Encounters:  03/15/22 263 lb 3.2 oz (119.4 kg)  03/13/22  269 lb (122 kg)  03/12/22 268 lb 6.4 oz (121.7 kg)   General: Alert, oriented, no distress.  Obesity Skin: normal turgor, no rashes, warm and dry HEENT: Normocephalic, atraumatic. Pupils equal round and reactive to light; sclera anicteric; extraocular muscles intact;  Nose without nasal septal hypertrophy Mouth/Parynx benign; Mallinpatti scale 4 Neck: No JVD, no carotid bruits; normal carotid upstroke Lungs: clear to ausculatation and percussion; no wheezing or rales Chest wall: without tenderness to palpitation Heart: PMI not displaced, RRR, s1 s2 normal, 1/6 systolic murmur, no diastolic murmur, no rubs, gallops, thrills, or heaves Abdomen: soft, nontender; no hepatosplenomehaly, BS+; abdominal aorta nontender and not dilated by palpation. Back: no CVA tenderness Pulses 2+ Musculoskeletal: full range of motion, normal strength, no  joint deformities Extremities: Right arm AV fistula; no clubbing cyanosis or edema, Homan's sign negative  Neurologic: grossly nonfocal; Cranial nerves grossly wnl Psychologic: Normal mood and affect     March 12, 2022 ECG (independently read by me): NSR at 73, LVH with repolarization changes; QTc prolongation at 511  September 18, 2021 ECG (independently read by me): Normal sinus rhythm at 81 bpm, QS complex V1 through V3.  Inferior and lateral ST-T changes.  August 01, 2021 ECG (independently read by me):  NSR at 66, STT changes inferolaterally  July 11, 2021 ECG (independently read by me):  NSR at 69, LVH with repolarization, no ectopy, normal intervals    May 16, 2021 ECG (independently read by me):  Sinus rhythm at 61 with sinus arrythmia; LVH, T wave abnormality I, aVL, normal intervals  February 2022 ECG (independently read by me): NSR at 70; QS anteroseptally, inferolateral T wave abnormality  September 14, 2020 ECG (independently read by me): Normal sinus rhythm at 73 bpm.  Poor anterior R wave progression V1 through V3.  Mild ST changes laterally.  Normal intervals.  No ectopy.  November 20, 2019 ECG (independently read by me): Normal sinus rhythm at 68 bpm.  Q-wave lead III.  Mild inferior T wave abnormality.  Normal intervals.  QTc interval 440 ms.  No ectopy  May 2019 ECG (independently read by me): NSR at 64; normal intervals.  No ectopy  November 2018 ECG (independently read by me): normal sinus rhythm at 67 bpm.  Normal intervals.  No ectopy.  September 2018 ECG (independently read by me): normal sinus rhythm at 71 bpm.  No ST segment changes.  Normal intervals.  May 2018 ECG (independently read by me): Normal sinus rhythm at 89 bpm.  No STT changes .  PR interval 126 ms.  QTc interval 457 ms. Normal ECG.  October 2017 ECG (independently read by me): Sinus tachycardia 101 bpm.  April 2017 ECG (independently read by me): Sinus tachycardia 100 bpm.  2.  TC interval 45  ms.  November 2014 ECG: Normal sinus rhythm;  PR interval 138 ms. QT interval 448 ms.  LABS:    Latest Ref Rng & Units 01/04/2022   11:26 AM 11/24/2021    3:33 PM 11/24/2021    3:02 PM  BMP  Glucose 70 - 99 mg/dL 700   62   BUN 6 - 23 mg/dL 41   53   Creatinine 1.74 - 1.50 mg/dL 9.44   9.67   Sodium 591 - 145 mEq/L 135  139  139   Potassium 3.5 - 5.1 mEq/L 3.8  4.4  4.4   Chloride 96 - 112 mEq/L 97   109   CO2 19 - 32 mEq/L 26  22   Calcium 8.4 - 10.5 mg/dL 10.2   10.0       Latest Ref Rng & Units 11/24/2021    3:02 PM 11/13/2021    4:49 AM 11/12/2021    1:51 AM  Hepatic Function  Total Protein 6.5 - 8.1 g/dL 6.1  5.5  5.4   Albumin 3.5 - 5.0 g/dL 3.7  2.8  2.6   AST 15 - 41 U/L $Remo'25  24  20   'mhsOr$ ALT 0 - 44 U/L 41  40  40   Alk Phosphatase 38 - 126 U/L 89  83  86   Total Bilirubin 0.3 - 1.2 mg/dL 0.7  0.5  0.5   Bilirubin, Direct 0.0 - 0.2 mg/dL 0.2          Latest Ref Rng & Units 11/24/2021    3:33 PM 11/24/2021    3:02 PM 11/13/2021    4:49 AM  CBC  WBC 4.0 - 10.5 K/uL  13.4  8.1   Hemoglobin 13.0 - 17.0 g/dL 11.9  11.6  11.5   Hematocrit 39.0 - 52.0 % 35.0  38.1  34.7   Platelets 150 - 400 K/uL  222  247     Lab Results  Component Value Date   MCV 88.6 11/24/2021   MCV 83.0 11/13/2021   MCV 84.4 11/12/2021   Lab Results  Component Value Date   TSH 5.23 01/04/2022   Lab Results  Component Value Date   HGBA1C 8.4 (H) 01/04/2022   Lipid Panel     Component Value Date/Time   CHOL 109 10/13/2021 0655   TRIG 66 10/13/2021 0655   HDL 31 (L) 10/13/2021 0655   CHOLHDL 3.5 10/13/2021 0655   VLDL 13 10/13/2021 0655   LDLCALC 65 10/13/2021 0655    RADIOLOGY: No results found.  ECHO: 05/02/2021 IMPRESSIONS   1. Left ventricular ejection fraction, by estimation, is 60 to 65%. The  left ventricle has normal function. The left ventricle has no regional  wall motion abnormalities. There is moderate left ventricular hypertrophy.  Left ventricular diastolic  parameters  are consistent with Grade II diastolic dysfunction  (pseudonormalization). Elevated left ventricular end-diastolic pressure.   2. Right ventricular systolic function is normal. The right ventricular  size is normal. There is moderately elevated pulmonary artery systolic  pressure.   3. Left atrial size was moderately dilated.   4. The mitral valve is abnormal. Mild mitral valve regurgitation.  Moderate to severe mitral annular calcification.   5. The aortic valve is tricuspid. There is moderate calcification of the  aortic valve. Aortic valve regurgitation is trivial. Moderate to severe  aortic valve stenosis. Aortic valve area, by VTI measures 1.09 cm. Aortic  valve mean gradient measures 44.4   mmHg. Aortic valve Vmax measures 3.99 m/s. DI is 0.29 - based on LVOT  diameter of 2.2 cm.   6. Aortic dilatation noted. There is mild dilatation of the ascending  aorta, measuring 42 mm.   7. The inferior vena cava is dilated in size with >50% respiratory  variability, suggesting right atrial pressure of 8 mmHg.   Comparison(s): Changes from prior study are noted. 11/16/2020: LVEF 65-70%,  moderate AS -mean gradient 34.6 mmHg.     R/L HEART CATH: 08/22/2021    2nd Mrg lesion is 50% stenosed.   Lat 2nd Mrg lesion is 20% stenosed.   RPAV lesion is 80% stenosed.   Dist LM to Prox LAD lesion is 80% stenosed.   Prox LAD  to Mid LAD lesion is 90% stenosed.   LV end diastolic pressure is mildly elevated.    Right Atrium RA: A-wave 7, V wave 4; mean 4 RV: 41/6 PA: 41/11 PW: A-wave 17, V wave 15; mean 15.  Initial AO: 114/60          LV: 161/22  Pullback: LV: 162/23 AO: 120/62  By the Fick method, cardiac output 7.3 L/min with cardiac index 2.9 L/min/m.  PVR: 1.8WU  Mean aortic gradient 40 mmHg Peak to peak aortic gradient 42 mmHg AVA: 0.95 cm2     IMPRESSION:  1. S/P AVR (aortic valve replacement)   2. S/P CABG (coronary artery bypass graft)   3. Cerebrovascular accident  (CVA), unspecified mechanism ([place])   4. Essential hypertension   5. Hyperlipidemia with target LDL less than 70   6. History of renal transplantation   7. OSA on BiPAP   8. Type 2 diabetes mellitus with complication, with long-term current use of insulin ([place])   9. Long term (current) use of anticoagulants      ASSESSMENT AND PLAN: Mr. Cerveny is a 61 year old Caucasian male who has a history of morbid obesity, hypertension, diabetes mellitus, obstructive sleep apnea on BiPAP, hyperlipidemia, and end-stage kidney disease.  He is status post remote nephrectomy in1974 and on 02/25/2017 after having been on hemodialysis for 3 years he underwent successful renal transplantation from a living donor done at [facility].  He developed a postoperative inguinal hernia, which subsequently was repaired.  There also was a history of possible diverticular bleeding.  He has a history of SVT, but he has not had any recurrence.  He is followed closely by the transplantation team at [facility].  I reviewed recent [facility] records.  Over the years, he developed progressive aortic valve stenosis and developed symptoms of exertional dyspnea leading to his right and left heart cardiac catheterization.  He was felt to have stage D severe aortic valve stenosis with class III symptoms with shortness of breath and chronic diastolic heart failure.  Ultimately he underwent successful AVR with a number 27 mm Edwards Inspiris Resilia pericardial valve and single-vessel CABG with a LIMA to his LAD.  He was felt to have CVA postoperatively due to embolism of precerebral artery and initially was anticoagulated with warfarin.  He has had subsequent rehospitalization's which I have reviewed where his blood pressure was markedly elevated and he also developed an episode of acute polynephritis and also had an episode of COVID.  Presently, upon presentation today blood pressure was elevated at 160/70 but on repeat by me was 138/74.   He is on carvedilol 25 mg twice a day, doxazosin 1 mg, hydralazine 100 mg every 8 hours HCTZ 12.5 mg for blood pressure control.  With his blood pressure elevation I have suggested initiation of amlodipine to start at 2.5 mg daily and if his systolic blood pressure is 140 or greater he will increase this to 5 mg daily.  He continues to be followed at [facility] for his renal transplant.  He is on rosuvastatin 20 mg for hyperlipidemia and continues to be on prednisone 5 mg daily and Nulojix.  He is on insulin for diabetes mellitus and levothyroxine for hypothyroidism.  He continues to use BiPAP with 100% compliance.  BMI is consistent with moderate obesity.  He has neurology follow-up this week with endocrinology follow-up in July and he sees Dr. Osborne Casco for primary care.  I will see him in 4 months for follow-up evaluation.  Troy Sine, MD, [facility]  03/26/2022 12:26 PM

## 2022-03-12 NOTE — Progress Notes (Unsigned)
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  He is a English as a second language teacher.  He endorses memory problems since the stroke.  Concern for vascular neurocognitive disorder.  I would like a neuropsychological evaluation prior to any release back to work.   1  Neuropsychological evaluation.  Will schedule for May, shortly before his short-term disability ends.   2  Secondary stroke prevention as managed by cardiology/PCP: - Warfarin - Crestor.  LDL goal less than 70 - Hgb A1c goal less than 7 - Normotensive blood pressure Follow up 6 months.     Subjective:  Robert [name] is a 61 year old left-handed male with CAD, DM II, aortic valve stenosis, ESRD s/p renal transplant with CKD3b, hypothyroidism, HTN, sleep apnea and paroxysmal atrial tachycardia and PSVT who follows up for stroke.  UPDATE: Current medications:  warfarin, rosuvastatin, ***  Underwent neuropsychological evaluation on 02/08/2022 consistent with primarily mild neurocognitive disorder in conjunction with underlying anxiety, chronic pain (neuropathy) and sleep dysfunction.  Marland Kitchen     HISTORY: Following CABG x1 and aortic valve replacement  on 10/05/2021, he noted that he was unable to lift his right leg.  He has history of right lower extremity weakness and pain following renal transplant in 2018 but his leg was now almost plegic.  CT head negative for acute abnormality.  MRI of brain on 10/11/2021 revealed scattered acute and subacute small infarcts in multiple vascular territories including left cerebellar hemisphere, left MCA territory and bilateral PCA territory.  MRA of head and neck demonstrated chronic occluded V4 segment of the right vertebral artery as well  as moderate stenosis of the right ACA A2.  The intra-operative TEE on 12/29 revealed no evidence of thrombus.  He did develop post-operative valvular a fib and was started on warfarin.  To evaluate for possible radiculopathy, he also had MRI of lumbar spine which revealed multilevel degenerative disc and endplate disease with mild spinal and left lateral recess stenosis at L1-2, moderate right neural foraminal stenosis at L3 and bilateral L4 nerve levels and moderate to severe neural foraminal stenosis at the bilateral L5 nerve levels.  He was discharged to SNF.     He was readmitted to the hospital on 10/21/2021 for hypertensive urgency presenting as dizziness and nausea.  Blood pressure was 204/119.  CT head again demonstrated known subacute embolic infarcts but no new acute findings.  He had to be hospitalized again on 11/08/2021 for   pyelonephritis and UTI.     Since the stroke, he has had episode of vertical double vision for several seconds.  He had worsening symptoms on 11/24/2021 while working with OT/PT.  Still has right leg weakness.  Arm is improved.  He has trouble ambulating.  He was seen in the ED where etiology was thought to be due to hypoglycemia.  CT head and follow up MRI of brain showed no acute findings.  Since the stroke, he has trouble remembering events that had occurred around that time. Prior to the surgery, he had been on a daily baby ASA.    PAST MEDICAL HISTORY: Past Medical History:  Diagnosis Date   Acute cystitis without hematuria 11/09/2021   AKI (acute kidney injury) 08/08/2017   Elevated sCr to 2.5 (baseline ~1.7) noted at OSH -  Subsequent transfer to [facility]  - Currently on IVMF in the setting of diarrhea and AKI - sCr at baseline at time of discharge   Anemia in chronic kidney disease 03/30/2015   Anticoagulated on Coumadin 11/09/2021   Aortic valve stenosis    Arthritis    Atrial fibrillation 11/29/2021   Benign essential hypertension 07/30/2014   prior to transplant  pt took Metorpolol tartrate '50mg'$  qd on off HD days   Benign neoplasm of transverse colon    Cancer    skin ca right shoulder, plastic dsyplasia, pre-Ca polpys removed on Colonoscopy- 07/2014   Cerebrovascular accident (CVA) due to embolism of precerebral artery    Chronic renal failure    Colon polyps 07/23/2014   Tubular adenomas x 5   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   Diverticulosis of colon without hemorrhage    Eczema    ESRD (end stage renal disease)    hemodialysis 06/2014-02/2017, s/p living unrelated kidney transplant 02/26/17   Exertional dyspnea    Fatigue 01/08/2014   Fissure of skin 03/08/2020   GI bleed 07/26/2017   Headache    Heart murmur    aortic stenosis (moderate-severe 04/2021)   History of COVID-19 2023   History of unilateral nephrectomy 09/06/2013   Hydrocele, acquired 04/07/2017   Hyperlipidemia 09/06/2013   Hypertensive urgency 10/21/2021   Hyponatremia 11/09/2021   Hypothyroidism    Immunosuppressed status 02/26/2017   Induction agent: Solumedrol - Envarsus '3mg'$  daily  - Home dose decreased from '5mg'$  to '3mg'$  in the setting of FK 15.7 while inpatient   - Previously on Prograf, though d/c'ed during previous admission; suspected this may be causing hallucinations  - Cellcept '1000mg'$  Q12H  - Prednisone '5mg'$  daily   Inguinal hernia without obstruction or gangrene 06/30/2017   Right inguinal wall defect appreciated by CT imaging [place] and on exam Dr. Paulla Fore today Dr. Paulla Fore has discussed hernia repair with mesh at some point in near future  Last Assessment & Plan:  Formatting of this note might be different from the original. Right inguinal wall defect appreciated by CT imaging [place] and on exam Dr. Paulla Fore today Dr. Paulla Fore has  discussed hern   Kidney transplanted 02/26/2017   Date of Transplant = 02/26/17  ABO (Donor/Recipient) = compatible Blood type: O+/A+ DonorType: Living donor, unrelated Allograft type:Left kidney Donor anatomy: 2 arteries, 1 veins and single ureter  Pants-to-skirt anastomosis of dual renal arteries on the backtable. Donor Kidney Bx: Not applicable Allograft injury/complications: None Ne   Lower extremity edema 09/06/2013   Macular degeneration    Mild vascular neurocognitive disorder 02/08/2022   OAB (overactive bladder) 12/28/2019   Obstructive sleep apnea 09/06/2013   On Bi-PAP  Last Assessment & Plan:  Formatting of this note might be different from the original. - History of OSA, continue CPAP at night   Peritoneal dialysis catheter in place 02/24/2015   RLQ abdominal pain 04/19/2020   S/P AVR (aortic valve replacement) 10/05/2021   SBO (small bowel obstruction) 05/2015   Scrotal edema 03/14/2017   Testicular ultrasound did show hydrocele  Will refer to urology regarding ongoign swelling, hydrocele.   Last Assessment & Plan:  Formatting of this note might be different from the original.   Testicular ultrasound did show hydrocele  Will refer to urology regarding ongoign swelling, hydrocele.   Shortness of breath    with exertion   Stage 3b chronic kidney disease (CKD) 03/22/2014   SVT (supraventricular tachycardia) 09/06/2013   Tremor of both hands 06/29/2017   Last Assessment & Plan:  Present since transplant, stable and unchanged Query from tacrolimus -Improved tremors noted and verbalized by pt - Prograf doses titrated per FK levels  Last Assessment & Plan:  Formatting of this note might be different from the original. Present since transplant, stable and unchanged Query from tacrolimus -Improved tremors noted and verbalized by pt - Prograf doses titr   Type 2 diabetes mellitus with hyperglycemia, with long-term current use of insulin 11/94/1740   Umbilical hernia    s/p repair 04/28/15     MEDICATIONS: Current Outpatient Medications on File Prior to Visit  Medication Sig Dispense Refill   acetaminophen (TYLENOL) 325 MG tablet Take 2 tablets (650 mg total) by mouth every 4 (four) hours as needed for moderate pain.     amLODipine (NORVASC) 10 MG tablet TAKE 1 TABLET BY MOUTH EVERY DAY (Patient not taking: Reported on 01/09/2022) 90 tablet 1   apixaban (ELIQUIS) 5 MG TABS tablet Take 1 tablet (5 mg total) by mouth 2 (two) times daily. Stop coumadin, monitor the PT/INR, and start Eliquis when the INR is <2. 180 tablet 3   BD DISP NEEDLE 23G X 1" MISC USE TO INJECT TESTOSTERONE MEDICATION 100 each 1   belatacept (NULOJIX) 250 MG SOLR injection Inject 24.5 mLs into the vein every 28 (twenty-eight) days.      carvedilol (COREG) 25 MG tablet Take 1 tablet (25 mg total) by mouth 2 (two) times daily with a meal.     cholecalciferol (VITAMIN D3) 25 MCG (1000 UNIT) tablet Take 1,000 Units by mouth in the morning and at bedtime. (Patient not taking: Reported on 01/09/2022)     cloNIDine (CATAPRES) 0.1 MG tablet Take 0.1 mg by mouth every 8 (eight) hours as needed (SBP >160).     Continuous Blood Gluc Sensor (FREESTYLE LIBRE 3 SENSOR) MISC SMARTSIG:1 Topical Every 2 Weeks     doxazosin (CARDURA) 2 MG tablet Take 0.5 tablets (1 mg total) by mouth daily. Take 0.5 Tablet Daily 45 tablet 3   DULoxetine (CYMBALTA) 60 MG capsule Take 60 mg by mouth daily.     gabapentin (NEURONTIN) 100 MG capsule Take 100 mg by mouth 2 (two) times daily. In am and at night     hydrALAZINE (APRESOLINE) 100 MG tablet TAKE 1 TABLET BY MOUTH EVERY 8 HOURS. 90 tablet 3   hydrochlorothiazide (MICROZIDE) 12.5 MG capsule Take 12.5 mg by mouth daily.     Insulin Pen Needle (B-D UF III MINI PEN NEEDLES) 31G X 5 MM MISC Use to inject insulin 4 times a day 300 each 2   levothyroxine (SYNTHROID) 112 MCG tablet Take 1 tablet (112 mcg total) by mouth daily. 90 tablet 3   mycophenolate (CELLCEPT) 250 MG capsule Take 1,000 mg by  mouth every 12 (twelve) hours.      NON FORMULARY CPAP/BIPAP  at bedtime  BIPAP per pt     predniSONE (DELTASONE) 5 MG tablet Take 5 mg by mouth daily with breakfast. Continuously     rosuvastatin (CRESTOR) 20 MG tablet Take 1 tablet (20 mg total) by mouth daily. 90 tablet 3   senna-docusate (SENOKOT-S) 8.6-50 MG tablet Take 1 tablet by mouth at bedtime as needed for mild constipation.     Syringe,  Disposable, 1 ML MISC Use to draw up 0.56m of testosterone medication 25 each 0   tamsulosin (FLOMAX) 0.4 MG CAPS capsule Take 0.8 mg by mouth at bedtime.      testosterone cypionate (DEPOTESTOSTERONE CYPIONATE) 200 MG/ML injection Inject 0.6 mLs (120 mg total) into the muscle every 7 (seven) days. (Patient taking differently: Inject 120 mg into the muscle every Friday.) 10 mL 1   traZODone (DESYREL) 50 MG tablet Take 50 mg by mouth See admin instructions. As needed at bedtime for insomnia x 14 days (Patient not taking: Reported on 01/09/2022)     zolpidem (AMBIEN) 5 MG tablet Take 5 mg by mouth at bedtime as needed for sleep.     No current facility-administered medications on file prior to visit.    ALLERGIES: No Known Allergies  FAMILY HISTORY: Family History  Problem Relation Age of Onset   Colon polyps Father    Stroke Father    Dementia Father        vascular   Colon cancer Neg Hx    Stomach cancer Neg Hx       Objective:  *** General: No acute distress.  Patient appears well-groomed.   Head:  Normocephalic/atraumatic Eyes:  Fundi examined but not visualized Neck: supple, no paraspinal tenderness, full range of motion Heart:  Regular rate and rhythm Lungs:  Clear to auscultation bilaterally Back: No paraspinal tenderness Neurological Exam: alert and oriented to person, place, and time.  Speech fluent and not dysarthric, language intact.  CN II-XII intact. Bulk and tone normal, muscle strength 5/5 throughout.  Sensation to light touch intact.  Deep tendon reflexes 2+ throughout,  toes downgoing.  Finger to nose testing intact.  Gait normal, Romberg negative.   AMetta Clines DO  CC: RDomenick Gong MD

## 2022-03-13 ENCOUNTER — Ambulatory Visit: Payer: 59 | Admitting: Neurology

## 2022-03-13 ENCOUNTER — Encounter: Payer: Self-pay | Admitting: Neurology

## 2022-03-13 VITALS — BP 120/60 | HR 76 | Ht 71.0 in | Wt 269.0 lb

## 2022-03-13 DIAGNOSIS — I999 Unspecified disorder of circulatory system: Secondary | ICD-10-CM

## 2022-03-13 DIAGNOSIS — E1142 Type 2 diabetes mellitus with diabetic polyneuropathy: Secondary | ICD-10-CM | POA: Diagnosis not present

## 2022-03-13 DIAGNOSIS — R42 Dizziness and giddiness: Secondary | ICD-10-CM

## 2022-03-13 DIAGNOSIS — I6310 Cerebral infarction due to embolism of unspecified precerebral artery: Secondary | ICD-10-CM

## 2022-03-13 DIAGNOSIS — M5416 Radiculopathy, lumbar region: Secondary | ICD-10-CM

## 2022-03-13 DIAGNOSIS — G4440 Drug-induced headache, not elsewhere classified, not intractable: Secondary | ICD-10-CM

## 2022-03-13 DIAGNOSIS — H1131 Conjunctival hemorrhage, right eye: Secondary | ICD-10-CM

## 2022-03-13 DIAGNOSIS — F0670 Mild neurocognitive disorder due to known physiological condition without behavioral disturbance: Secondary | ICD-10-CM

## 2022-03-13 NOTE — Patient Instructions (Addendum)
I will refer you to ophthalmology for evaluation of subconjunctival hemorrhage. Try to work on treating the anxiety

## 2022-03-14 ENCOUNTER — Encounter: Payer: Self-pay | Admitting: Podiatry

## 2022-03-14 ENCOUNTER — Ambulatory Visit (INDEPENDENT_AMBULATORY_CARE_PROVIDER_SITE_OTHER): Payer: 59 | Admitting: Podiatry

## 2022-03-14 DIAGNOSIS — M79674 Pain in right toe(s): Secondary | ICD-10-CM

## 2022-03-14 DIAGNOSIS — E1149 Type 2 diabetes mellitus with other diabetic neurological complication: Secondary | ICD-10-CM | POA: Diagnosis not present

## 2022-03-14 DIAGNOSIS — M79675 Pain in left toe(s): Secondary | ICD-10-CM | POA: Diagnosis not present

## 2022-03-14 DIAGNOSIS — B351 Tinea unguium: Secondary | ICD-10-CM

## 2022-03-14 DIAGNOSIS — N189 Chronic kidney disease, unspecified: Secondary | ICD-10-CM | POA: Diagnosis not present

## 2022-03-14 NOTE — Progress Notes (Signed)
This patient returns to my office for at risk foot care.  This patient requires this care by a professional since this patient will be at risk due to having uncontrolled diabetes and kidney transplant.  This patient is unable to cut nails himself since the patient cannot reach his nails.These nails are painful walking and wearing shoes.  Resolved heel fissure.   This patient presents for at risk foot care today.  General Appearance  Alert, conversant and in no acute stress.  Vascular  Dorsalis pedis and posterior tibial  pulses are palpable  bilaterally.  Capillary return is within normal limits  bilaterally. Temperature is within normal limits  bilaterally.  Neurologic  Senn-Weinstein monofilament wire test diminished   bilaterally. Muscle power within normal limits bilaterally.  Nails Thick disfigured discolored nails with subungual debris  from hallux to fifth toes bilaterally. No evidence of bacterial infection or drainage bilaterally.  Orthopedic  No limitations of motion  feet .  No crepitus or effusions noted.  No bony pathology or digital deformities noted. HAV  B/L.  Skin  normotropic skin with no porokeratosis noted bilaterally.  No signs of infections or ulcers noted.     Onychomycosis  Pain in right toes  Pain in left toes   Consent was obtained for treatment procedures.   Mechanical debridement of nails 1-5  bilaterally performed with a nail nipper.  Filed with dremel without incident.     Return office visit  3 months                   Told patient to return for periodic foot care and evaluation due to potential at risk complications.   Bev Drennen DPM  

## 2022-03-15 ENCOUNTER — Encounter: Payer: Self-pay | Admitting: Cardiovascular Disease

## 2022-03-15 ENCOUNTER — Telehealth: Payer: Self-pay | Admitting: Cardiovascular Disease

## 2022-03-15 ENCOUNTER — Ambulatory Visit: Payer: 59 | Admitting: Cardiovascular Disease

## 2022-03-15 VITALS — BP 142/60 | HR 69 | Ht 71.0 in | Wt 263.2 lb

## 2022-03-15 DIAGNOSIS — I209 Angina pectoris, unspecified: Secondary | ICD-10-CM

## 2022-03-15 MED ORDER — NITROGLYCERIN 0.4 MG SL SUBL: 0.4000 mg | SUBLINGUAL_TABLET | SUBLINGUAL | 3 refills | Status: DC | PRN

## 2022-03-15 NOTE — Telephone Encounter (Signed)
Pt c/o of Chest Pain: STAT if CP now or developed within 24 hours  1. Are you having CP right now? Yes   2. Are you experiencing any other symptoms (ex. SOB, nausea, vomiting, sweating)?   3. How long have you been experiencing CP?   4. Is your CP continuous or coming and going?   5. Have you taken Nitroglycerin?  ? t

## 2022-03-15 NOTE — Patient Instructions (Signed)
Medication Instructions:  Take Nitroglycerin as needed for chest pain: Place one tablet under the tongue as needed every 5 minutes for chest pain. If you have to use three tablets with no relief, please call 911 or go to your closest Emergency Department.  *If you need a refill on your cardiac medications before your next appointment, please call your pharmacy*   Lab Work: None ordered If you have labs (blood work) drawn today and your tests are completely normal, you will receive your results only by: [place] (if you have MyChart) OR A paper copy in the mail If you have any lab test that is abnormal or we need to change your treatment, we will call you to review the results.   Testing/Procedures: None ordered   Follow-Up: At [facility], you and your health needs are our priority.  As part of our continuing mission to provide you with exceptional heart care, we have created designated Provider Care Teams.  These Care Teams include your primary Cardiologist (physician) and Advanced Practice Providers (APPs -  Physician Assistants and Nurse Practitioners) who all work together to provide you with the care you need, when you need it.  We recommend signing up for the patient portal called "MyChart".  Sign up information is provided on this After Visit Summary.  MyChart is used to connect with patients for Virtual Visits (Telemedicine).  Patients are able to view lab/test results, encounter notes, upcoming appointments, etc.  Non-urgent messages can be sent to your provider as well.   To learn more about what you can do with MyChart, go to [email or web address].    Your next appointment:   Keep follow up with Dr. Claiborne Billings as scheduled.   Important Information About Sugar

## 2022-03-15 NOTE — Telephone Encounter (Signed)
Spoke with pt, he reports a "hard" conversation with his supervisor today and he then developed angina. He then reports his arms, feet and legs started tingling. He reports he rested and developed a dull headache.his bp was 180/90 pulse 72. He reports the angina is completely gone now and so is the tingling. He is now having dizziness and blurred vision. His current bp is 158/91 pulse 70. Discussed with dr croitoru (dod), the patient will get a ride and come to the office this afternoon.

## 2022-03-15 NOTE — Progress Notes (Signed)
Cardiology Office Note:    Date:  03/15/2022   ID:  Robert [name], DOB 1961/08/10, MRN [id number]  PCP:  Haywood Pao, MD   Robert [name] Cardiologist:  Shelva Majestic, MD     Referring MD: Haywood Pao, MD   No chief complaint on file.   History of Present Illness:    Robert [name] is a 61 y.o. male with a hx of extensive and complex cardiac and medical issues that are well summarized in Dr. Evette [name] note from 03/12/2022.  He presents today with chest pain that occurred during an intense phone conversation/argument.  He was having a discussion with his immediate superior and human resources regarding his ability to continue working from home.  He is afraid that they are trying to fire him.  He works 100% from home, on the phone, doing zoom calls etc.  He became very emotional during the conversation and began experiencing his usual angina radiating down from his chest to his right arm.  He became short of breath and developed a headache and some nausea.  He had some diaphoresis.  After finishing the conversation his symptoms very slowly began to improve.  He checked his blood pressure about 10 to 15 minutes later and was very high at 180/high 90s.  He believes it must of been even higher earlier.  He called our office to arrange his appointment, it is now been about 4 hours since these events and he feels better.  The angina has resolved.  He still has some tingling sensation in his right hand and has a very mild headache (1/10 in intensity).  He is electrocardiogram shows normal sinus rhythm with nonspecific ST-T changes.  He has scooping of the ST segments most obvious in leads I and aVL, also in V5 and V6.  The QTc is mildly prolonged at 490 ms.  It looks very similar to old ECG tracings (the ECG performed on Monday, June 5th does not show prominent changes in V5 and V6 since the leads are clearly placed more medially than they were today).  He has some nausea  last night, but this was not associated with chest pain.  He has episcleral hemorrhage of his right eye following a coughing spell within the last week or 2.  He underwent multivessel bypass surgery and AVR with a biological prosthesis roughly 6 months ago (10/05/2021, Dr. Cyndia [name]).  He is on chronic anticoagulation with Eliquis for paroxysmal atrial fibrillation and history of embolic stroke. He has normal left ventricular systolic function but does have pulmonary hypertension. 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  He had SVT in the past that responded to vagal maneuvers.  This was felt to be due to an accessory pathway, but the arrhythmia could not be induced during EP study so an ablation could not be performed.  Palpitations have not bothered him recently.  He has obstructive sleep apnea and uses CPAP.  Diabetes mellitus has usually been well controlled with a hemoglobin A1c of 6.9% last year, but with recent deterioration to 8.4% in March of this year.    Past Medical History:  Diagnosis Date   Acute cystitis without hematuria 11/09/2021   AKI (acute kidney injury) 08/08/2017   Elevated sCr to 2.5 (baseline ~1.7) noted at OSH - Subsequent transfer to  [facility]  - Currently on IVMF in the setting of diarrhea and AKI - sCr at baseline at time of discharge   Anemia in chronic kidney disease 03/30/2015   Anticoagulated on Coumadin 11/09/2021   Aortic valve stenosis    Arthritis    Atrial fibrillation 11/29/2021   Benign essential hypertension 07/30/2014   prior to transplant pt took Metorpolol tartrate '50mg'$  qd on off HD days   Benign neoplasm of transverse colon    Cancer    skin ca right shoulder, plastic dsyplasia, pre-Ca polpys removed on Colonoscopy- 07/2014   Cerebrovascular accident (CVA) due to embolism of  precerebral artery    Chronic renal failure    Colon polyps 07/23/2014   Tubular adenomas x 5   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   Diverticulosis of colon without hemorrhage    Eczema    ESRD (end stage renal disease)    hemodialysis 06/2014-02/2017, s/p living unrelated kidney transplant 02/26/17   Exertional dyspnea    Fatigue 01/08/2014   Fissure of skin 03/08/2020   GI bleed 07/26/2017   Headache    Heart murmur    aortic stenosis (moderate-severe 04/2021)   History of COVID-19 2023   History of unilateral nephrectomy 09/06/2013   Hydrocele, acquired 04/07/2017   Hyperlipidemia 09/06/2013   Hypertensive urgency 10/21/2021   Hyponatremia 11/09/2021   Hypothyroidism    Immunosuppressed status 02/26/2017   Induction agent: Solumedrol - Envarsus '3mg'$  daily  - Home dose decreased from '5mg'$  to '3mg'$  in the setting of FK 15.7 while inpatient   - Previously on Prograf, though d/c'ed during previous admission; suspected this may be causing hallucinations  - Cellcept '1000mg'$  Q12H  - Prednisone '5mg'$  daily   Inguinal hernia without obstruction or gangrene 06/30/2017   Right inguinal wall defect appreciated by CT imaging [place] and on exam Dr. Paulla Fore today Dr. Paulla Fore has discussed hernia repair with mesh at some point in near future  Last Assessment & Plan:  Formatting of this note might be different from the original. Right inguinal wall defect appreciated by CT imaging [place] and on exam Dr. Paulla Fore today Dr. Paulla Fore has discussed hern   Kidney transplanted 02/26/2017   Date of Transplant = 02/26/17  ABO (Donor/Recipient) = compatible Blood type: O+/A+ DonorType: Living donor, unrelated Allograft type:Left kidney Donor anatomy: 2 arteries, 1 veins and single ureter   Pants-to-skirt anastomosis of dual renal arteries on the backtable. Donor Kidney Bx: Not applicable Allograft injury/complications: None Ne   Lower extremity edema 09/06/2013   Macular degeneration    Mild vascular neurocognitive disorder 02/08/2022   OAB (overactive bladder) 12/28/2019   Obstructive sleep apnea 09/06/2013   On Bi-PAP  Last Assessment & Plan:  Formatting of this note might be different from the original. - History of OSA, continue CPAP at night   Peritoneal dialysis catheter in place 02/24/2015   RLQ abdominal pain 04/19/2020   S/P AVR (aortic valve replacement) 10/05/2021   SBO (small bowel obstruction) 05/2015   Scrotal edema 03/14/2017   Testicular ultrasound did show hydrocele  Will refer to urology regarding ongoign swelling, hydrocele.   Last Assessment & Plan:  Formatting of this note might be different from the original.   Testicular ultrasound did show hydrocele  Will refer  to urology regarding ongoign swelling, hydrocele.   Shortness of breath    with exertion   Stage 3b chronic kidney disease (CKD) 03/22/2014   SVT (supraventricular tachycardia) 09/06/2013   Tremor of both hands 06/29/2017   Last Assessment & Plan:  Present since transplant, stable and unchanged Query from tacrolimus -Improved tremors noted and verbalized by pt - Prograf doses titrated per FK levels  Last Assessment & Plan:  Formatting of this note might be different from the original. Present since transplant, stable and unchanged Query from tacrolimus -Improved tremors noted and verbalized by pt - Prograf doses titr   Type 2 diabetes mellitus with hyperglycemia, with long-term current use of insulin 52/84/1324   Umbilical hernia    s/p repair 04/28/15    Past Surgical History:  Procedure Laterality Date   AORTIC VALVE REPLACEMENT N/A 10/05/2021   Procedure: AORTIC VALVE REPLACEMENT (AVR) WITH INSPIRIS RESILIA AORTIC VALVE SIZE 27MM;  Surgeon: [name], MD;  Location: [place];  Service:  Open Heart Surgery;  Laterality: N/A;   AV FISTULA PLACEMENT Right 02/25/2015   Procedure: RIGHT ARTERIOVENOUS (AV) FISTULA CREATION;  Surgeon: Serafina Mitchell, MD;  Location: [place];  Service: Vascular;  Laterality: Right;   AV FISTULA PLACEMENT Right 09/15/2015   Procedure: ARTERIOVENOUS (AV) FISTULA CREATION- RIGHT ARM;  Surgeon: Serafina Mitchell, MD;  Location: [place];  Service: Vascular;  Laterality: Right;   CAPD INSERTION N/A 05/18/2014   Procedure: LAPAROSCOPIC INSERTION CONTINUOUS AMBULATORY PERITONEAL DIALYSIS  (CAPD) CATHETER;  Surgeon: Ralene Ok, MD;  Location: [place];  Service: General;  Laterality: N/A;   COLONOSCOPY     COLONOSCOPY WITH PROPOFOL N/A 07/12/2016   Procedure: COLONOSCOPY WITH PROPOFOL;  Surgeon: [name], MD;  Location: [facility];  Service: Endoscopy;  Laterality: N/A;   CORONARY ARTERY BYPASS GRAFT N/A 10/05/2021   Procedure: CORONARY ARTERY BYPASS GRAFTING (CABG) X 1, ON PUMP, USING LEFT INTERNAL MAMMARY ARTERY CONDUIT;  Surgeon: [name], MD;  Location: [place];  Service: Open Heart Surgery;  Laterality: N/A;   declotting of fistula  08/2015   ESOPHAGOGASTRODUODENOSCOPY (EGD) WITH PROPOFOL N/A 07/12/2016   Procedure: ESOPHAGOGASTRODUODENOSCOPY (EGD) WITH PROPOFOL;  Surgeon: [name], MD;  Location: [facility];  Service: Endoscopy;  Laterality: N/A;   FISTULA SUPERFICIALIZATION Right 02/07/2016   Procedure: RIIGHT UPPER ARM FISTULA SUPERFICIALIZATION;  Surgeon: Elam Dutch, MD;  Location: [place];  Service: Vascular;  Laterality: Right;   IJ catheter insertion     INSERTION OF DIALYSIS CATHETER Right 02/25/2015   Procedure: INSERTION OF RIGHT INTERNAL JUGULAR DIALYSIS CATHETER;  Surgeon: Serafina Mitchell, MD;  Location: [place] [facility];  Service: Vascular;  Laterality: Right;   KIDNEY TRANSPLANT     LAPAROSCOPIC REPOSITIONING CAPD CATHETER N/A 06/16/2014   Procedure: LAPAROSCOPIC REPOSITIONING CAPD CATHETER;  Surgeon: Ralene Ok, MD;  Location: [place];   Service: General;  Laterality: N/A;   LAPAROSCOPY N/A 05/04/2015   Procedure: LAPAROSCOPY DIAGNOSTIC LYSIS OF ADHESIONS;  Surgeon: Coralie Keens, MD;  Location: [place];  Service: General;  Laterality: N/A;   MINOR REMOVAL OF PERITONEAL DIALYSIS CATHETER N/A 04/28/2015   Procedure:  REMOVAL OF PERITONEAL DIALYSIS CATHETER;  Surgeon: Coralie Keens, MD;  Location: [place];  Service: General;  Laterality: N/A;   NEPHRECTOMY Left 1974   RENAL BIOPSY Right 2012   REVISON OF ARTERIOVENOUS FISTULA Right 05/26/2015   Procedure: SUPERFICIALIZATION OF ARTERIOVENOUS FISTULA WITH SIDE BRANCH LIGATIONS;  Surgeon: Serafina Mitchell, MD;  [facility];  Service: Vascular;  Laterality: Right;   RIGHT/LEFT HEART CATH AND CORONARY ANGIOGRAPHY N/A 08/22/2021   Procedure: RIGHT/LEFT HEART CATH AND CORONARY ANGIOGRAPHY;  Surgeon: Troy Sine, MD;  Location: [place] [facility];  Service: Cardiovascular;  Laterality: N/A;   SKIN CANCER EXCISION     right shoulder   SVT ABLATION N/A 11/23/2016   Procedure: SVT Ablation;  Surgeon: Will Meredith Leeds, MD;  Location: [place] [facility];  Service: Cardiovascular;  Laterality: N/A;   TEE WITHOUT CARDIOVERSION N/A 10/05/2021   Procedure: TRANSESOPHAGEAL ECHOCARDIOGRAM (TEE);  Surgeon: [name], MD;  Location: [place];  Service: Open Heart Surgery;  Laterality: N/A;   UMBILICAL HERNIA REPAIR N/A 05/18/2014   Procedure: HERNIA REPAIR UMBILICAL ADULT;  Surgeon: Ralene Ok, MD;  Location: [place];  Service: General;  Laterality: N/A;   UMBILICAL HERNIA REPAIR N/A 04/28/2015   Procedure: UMBILICAL HERNIA REPAIR WITH MESH;  Surgeon: Coralie Keens, MD;  Location: [place];  Service: General;  Laterality: N/A;    Current Medications: Current Meds  Medication Sig   apixaban (ELIQUIS) 5 MG TABS tablet Take 1 tablet (5 mg total) by mouth 2 (two) times daily. Stop coumadin, monitor the PT/INR, and start Eliquis when the INR is <2.   BD DISP NEEDLE 23G X 1" MISC USE TO  INJECT TESTOSTERONE MEDICATION   belatacept (NULOJIX) 250 MG SOLR injection Inject 24.5 mLs into the vein every 28 (twenty-eight) days.    carvedilol (COREG) 25 MG tablet Take 1 tablet (25 mg total) by mouth 2 (two) times daily with a meal.   Continuous Blood Gluc Sensor (FREESTYLE LIBRE 3 SENSOR) MISC SMARTSIG:1 Topical Every 2 Weeks   doxazosin (CARDURA) 2 MG tablet Take 0.5 tablets (1 mg total) by mouth daily. Take 0.5 Tablet Daily   DULoxetine (CYMBALTA) 60 MG capsule Take 60 mg by mouth daily.   hydrALAZINE (APRESOLINE) 100 MG tablet TAKE 1 TABLET BY MOUTH EVERY 8 HOURS.   hydrochlorothiazide (MICROZIDE) 12.5 MG capsule Take 12.5 mg by mouth daily.   insulin aspart (NOVOLOG) 100 UNIT/ML injection Inject into the skin 3 (three) times daily before meals.   Insulin Pen Needle (B-D UF III MINI PEN NEEDLES) 31G X 5 MM MISC Use to inject insulin 4 times a day   levothyroxine (SYNTHROID) 112 MCG tablet Take 1 tablet (112 mcg total) by mouth daily.   mycophenolate (CELLCEPT) 250 MG capsule Take 1,000 mg by mouth every 12 (twelve) hours.    nitroGLYCERIN (NITROSTAT) 0.4 MG SL tablet Place 1 tablet (0.4 mg total) under the tongue every 5 (five) minutes as needed for chest pain.   NON FORMULARY CPAP/BIPAP  at bedtime  BIPAP per pt   predniSONE (DELTASONE) 5 MG tablet Take 5 mg by mouth daily with breakfast. Continuously   rosuvastatin (CRESTOR) 20 MG tablet Take 1 tablet (20 mg total) by mouth daily.   senna-docusate (SENOKOT-S) 8.6-50 MG tablet Take 1 tablet by mouth at bedtime as needed for mild constipation.   traZODone (DESYREL) 50 MG tablet Take 50 mg by mouth See admin instructions. As needed at bedtime for insomnia x 14 days     Allergies:   Patient has no known allergies.   Social History   Socioeconomic History   Marital status: Married    Spouse name: Not on file   Number of children: 3   Years of education: 16   Highest education level: Bachelor's degree (e.g., BA, AB, BS)   Occupational History  Occupation: Training and development officer: [employer]  Tobacco Use   Smoking status: Former    Types: Cigars    Quit date: 12/06/2012    Years since quitting: 9.2   Smokeless tobacco: Never  Vaping Use   Vaping Use: Never used  Substance and Sexual Activity   Alcohol use: Not Currently   Drug use: No   Sexual activity: Not on file  Other Topics Concern   Not on file  Social History Narrative   Not on file   Social Determinants of Health   Financial Resource Strain: Not on file  Food Insecurity: Not on file  Transportation Needs: Not on file  Physical Activity: Not on file  Stress: Not on file  Social Connections: Not on file     Family History: The patient's family history includes Colon polyps in his father; Dementia in his father; Stroke in his father. There is no history of Colon cancer or Stomach cancer.  ROS:   Please see the history of present illness.     All other systems reviewed and are negative.  EKGs/Labs/Other Studies Reviewed:    The following studies were reviewed today: Echocardiogram 11/27/2021 He has normal left ventricular systolic function but does have pulmonary hypertension.  EKG:  EKG is  ordered today.  The ekg ordered today demonstrates normal sinus rhythm, nonspecific ST segment downsloping scooping depression in leads I, aVL, V5, V6, prolonged QTc 490 ms, similar to previous tracings.  Recent Labs: 11/13/2021: B Natriuretic Peptide 571.0; Magnesium 1.7 11/24/2021: ALT 41; Hemoglobin 11.9; Platelets 222 01/04/2022: BUN 41; Creatinine, Ser 2.49; Potassium 3.8; Sodium 135; TSH 5.23  Recent Lipid Panel    Component Value Date/Time   CHOL 109 10/13/2021 0655   TRIG 66 10/13/2021 0655   HDL 31 (L) 10/13/2021 0655   CHOLHDL 3.5 10/13/2021 0655   VLDL 13 10/13/2021 0655   LDLCALC 65 10/13/2021 0655     Risk Assessment/Calculations:    CHA2DS2-VASc Score = 5   This indicates a 7.2% annual risk of stroke. The patient's score is  based upon: CHF History: 0 HTN History: 1 Diabetes History: 1 Stroke History: 2 Vascular Disease History: 1 Age Score: 0 Gender Score: 0          Physical Exam:    VS:  BP (!) 142/60 (BP Location: Left Arm, Patient Position: Sitting, Cuff Size: Large)   Pulse 69   Ht '5\' 11"'$  (1.803 m)   Wt 263 lb 3.2 oz (119.4 kg)   SpO2 96%   BMI 36.71 kg/m     Wt Readings from Last 3 Encounters:  03/15/22 263 lb 3.2 oz (119.4 kg)  03/13/22 269 lb (122 kg)  03/12/22 268 lb 6.4 oz (121.7 kg)     GEN: Obese, well nourished, well developed in no acute distress HEENT: Normal except for scleral hemorrhage right eye NECK: He has prominent unilateral jugular venous distention on the right side which seems to be related to the very large AV fistula in his right upper extremity.  The bruit and thrill from the right forearm AV fistula can be perceived all the way up to his neck.  No carotid bruits LYMPHATICS: No lymphadenopathy CARDIAC: Regular rate and rhythm with normal S1 and S2, 1-2/6 early peaking aortic ejection murmur, no diastolic murmurs, RRR, no diastolic murmurs, rubs, gallops RESPIRATORY:  Clear to auscultation without rales, wheezing or rhonchi  ABDOMEN: Soft, non-tender, non-distended MUSCULOSKELETAL:  No edema; No deformity very large pulsating AV fistula right forearm's  snaking all the way up his arm to his jugular area SKIN: Warm and dry NEUROLOGIC:  Alert and oriented x 3 PSYCHIATRIC:  Normal affect   ASSESSMENT:    1. Chest pain of uncertain etiology    PLAN:    In order of problems listed above:  Angina pectoris: I think he very clearly had an episode of provoked angina, likely due to extreme elevation in heart rate and/or blood pressure related to emotional distress.  The symptoms have resolved as he has calm down.  He is currently essentially asymptomatic.  His ECG does not show new changes.  I gave him a prescription for sublingual nitroglycerin to take as needed.  He is  on a beta-blocker in a high dose.  He does not take aspirin since he is fully anticoagulated with Eliquis.  We spent quite a while discussing the distinction between stable unstable angina and when he should seek prompt medical attention (symptoms lasting for more than 30 minutes not relieved by 3 sublingual nitroglycerin given consecutively every 5 minutes. Unfortunately sounds like his problems at work may continue.  He is exploring options for short and long-term disability.          Medication Adjustments/Labs and Tests Ordered: Current medicines are reviewed at length with the patient today.  Concerns regarding medicines are outlined above.  Orders Placed This Encounter  Procedures   EKG 12-Lead   Meds ordered this encounter  Medications   nitroGLYCERIN (NITROSTAT) 0.4 MG SL tablet    Sig: Place 1 tablet (0.4 mg total) under the tongue every 5 (five) minutes as needed for chest pain.    Dispense:  90 tablet    Refill:  3    Patient Instructions  Medication Instructions:  Take Nitroglycerin as needed for chest pain: Place one tablet under the tongue as needed every 5 minutes for chest pain. If you have to use three tablets with no relief, please call 911 or go to your closest Emergency Department.  *If you need a refill on your cardiac medications before your next appointment, please call your pharmacy*   Lab Work: None ordered If you have labs (blood work) drawn today and your tests are completely normal, you will receive your results only by: [place] (if you have MyChart) OR A paper copy in the mail If you have any lab test that is abnormal or we need to change your treatment, we will call you to review the results.   Testing/Procedures: None ordered   Follow-Up: At [facility], you and your health needs are our priority.  As part of our continuing mission to provide you with exceptional heart care, we have created designated Provider Care Teams.  These Care  Teams include your primary Cardiologist (physician) and [name] (APPs -  Physician Assistants and Nurse Practitioners) who all work together to provide you with the care you need, when you need it.  We recommend signing up for the patient portal called "MyChart".  Sign up information is provided on this After Visit Summary.  MyChart is used to connect with patients for Virtual Visits (Telemedicine).  Patients are able to view lab/test results, encounter notes, upcoming appointments, etc.  Non-urgent messages can be sent to your provider as well.   To learn more about what you can do with MyChart, go to [email or web address].    Your next appointment:   Keep follow up with Dr. Claiborne Billings as scheduled.   Important Information About Sugar  Signed, Sanda Klein, MD  03/15/2022 7:13 PM    [facility]

## 2022-03-16 ENCOUNTER — Telehealth: Payer: Self-pay | Admitting: Neurology

## 2022-03-16 NOTE — Telephone Encounter (Signed)
Pt called in and left a message. He says his back and hip are really starting to hurt a good amount. He was wondering if there is any type of pain medication that Dr. Tomi Likens might be able to prescribe?

## 2022-03-16 NOTE — Telephone Encounter (Signed)
Called pateint and gave him Dr. Tomi Likens recommendations to reach out to PCP for a referral to pain management

## 2022-03-19 ENCOUNTER — Telehealth: Payer: Self-pay | Admitting: Licensed Clinical Social Worker

## 2022-03-21 NOTE — Progress Notes (Signed)
Heart and Vascular Care Navigation  03/21/2022  Robert [name] Oct 13, 1960 [id number]  Reason for Referral:  Patient is participating in a Managed Medicaid Plan: No Pt referred by MD team regarding applying for disability.  Engaged with patient by telephone for initial visit for Heart and Vascular Care Coordination.                                                                                                   Assessment:                                     LCSW reached out to pt regarding referral from Dr. Evette Georges team for disability assistance.  I reached him via telephone at [phone]; introduced self, role, reason for visit. Pt confirmed home address, PCP, and current coverage and emergency contacts. Pt has multiple care needs that involve coordination including cardiology and nephrology (hx of transplant). Pt is married, it is just them and he is concerned should he leave his job due to multiple care needs that the insurance his wife  may be able to get through her insurance wouldn't be good enough to meet what he needs. At this time does not have any financial concerns but should he have to leave his job he is concerned about managing all bills while waiting for potential disability determination. Pt interested in applying for disability, is not very tech savvy so is considering hiring an attorney to help him apply and monitor any applications.   LCSW shared options for additional disability support including Legal Aid, [place] should he not be able to get support through hired legal help. Pt encouraged to f/u with me regarding any additional questions/concerns that may arise.  HRT/VAS Care Coordination     Patients Home Cardiology Office [place] Team Social Worker   Social Worker Name: Valeda Malm, [place] [facility] [phone]   Living arrangements for the past 2 months Single Family Home   Lives  with: Spouse   Patient Current Insurance Sports coach   Patient Has Concern With Paying Medical Bills No   Does Patient Have Prescription Coverage? Yes   Home Assistive Devices/Equipment BIPAP; Eyeglasses   DME Agency [employer]   [place]       Social History:                                                                             SDOH Screenings   Alcohol Screen: Not on file  Depression (PHQ2-9): Not on file  Financial Resource Strain: Low Risk  (03/21/2022)   Overall Financial Resource Strain (CARDIA)    Difficulty of Paying Living Expenses:  Not very hard  Food Insecurity: No Food Insecurity (03/21/2022)   Hunger Vital Sign    Worried About Running Out of Food in the Last Year: Never true    Ran Out of Food in the Last Year: Never true  Housing: Low Risk  (03/21/2022)   Housing    Last Housing Risk Score: 0  Physical Activity: Not on file  Social Connections: Not on file  Stress: Not on file  Tobacco Use: Medium Risk (03/15/2022)   Patient History    Smoking Tobacco Use: Former    Smokeless Tobacco Use: Never    Passive Exposure: Not on file  Transportation Needs: No Transportation Needs (03/21/2022)   PRAPARE - Hydrologist (Medical): No    Lack of Transportation (Non-Medical): No    SDOH Interventions: Financial Resources:  Financial Strain Interventions: Other (Comment) (pt plans to start disability process, information on how to do so and community resources have been sent to pt.) Social Security for Licensed conveyancer Insecurity:  Food Insecurity Interventions: Intervention Not Indicated  Housing Insecurity:  Housing Interventions: Intervention Not Indicated  Transportation:   Transportation Interventions: Intervention Not Indicated     Other Care Navigation Interventions:     Provided Pharmacy assistance resources  Denied any current concerns.  Patient expressed  Mental Health concerns No. General stress regarding health, impact things would have on finances   Follow-up plan:   LCSW mailed pt the following: my card, [place], Network engineer, [employer]. Would need referral from this writer if [facility] needed. I remain available for pt support as needed, will f/u as able to ensure resources received.

## 2022-03-22 ENCOUNTER — Telehealth: Payer: Self-pay | Admitting: Neurology

## 2022-03-22 NOTE — Telephone Encounter (Signed)
Pt called in stating he is very dizzy and would like to find out which eye doctor Dr. Tomi Likens would like for him to see.   He also stated his back and hip are weak and in pain. He knows Dr. Tomi Likens won't prescribe him pain medication, but would like to know if there is something else he can do?

## 2022-03-23 NOTE — Telephone Encounter (Signed)
Dr. Tomi Likens I called patient back and was speaking to him about a referral and I did find this in your notes but see no referral unless I have missed it. I  will refer you to ophthalmology for evaluation of subconjunctival hemorrhage

## 2022-03-26 ENCOUNTER — Encounter: Payer: Self-pay | Admitting: Cardiovascular Disease

## 2022-03-28 ENCOUNTER — Other Ambulatory Visit: Payer: Self-pay

## 2022-03-28 DIAGNOSIS — H1130 Conjunctival hemorrhage, unspecified eye: Secondary | ICD-10-CM

## 2022-03-28 NOTE — Telephone Encounter (Signed)
Sent referral to [facility] Phone [phone] FaX# [phone]

## 2022-04-04 ENCOUNTER — Telehealth: Payer: Self-pay | Admitting: Cardiovascular Disease

## 2022-04-04 NOTE — Telephone Encounter (Signed)
Pt c/o BP issue: STAT if pt c/o blurred vision, one-sided weakness or slurred speech  1. What are your last 5 BP readings?   2 am  1706/94           171/90  8:30 am  145/70  2. Are you having any other symptoms (ex. Dizziness, headache, blurred vision, passed out)?    Has been having dizziness for months.  3. What is your BP issue?    Patient stated after he took the cloNIDine (CATAPRES) 0.1 MG tablet at 8:30 am his BP went down.  Patient stated he has had blurred vision but went to the ophthalmologist and believes that is related to the medication.  Please advise.

## 2022-04-04 NOTE — Telephone Encounter (Signed)
Pt informed of PHARMD recommendations. Pt verbalized understanding. Appt scheduled for 04-19-22 as requested as he will be out of town

## 2022-04-04 NOTE — Telephone Encounter (Signed)
Any of his blood pressure medications can cause dizziness. However high blood pressure can also cause dizziness. I would recommend he be made an apt in the PharmD clinic to review medications/blood pressure

## 2022-04-04 NOTE — Telephone Encounter (Signed)
Returned call to Pt.  Pt's question is:  Is there any medications that he is taking that could be causing dizziness.  He states he has had dizziness for months, and his opthmalogist advised he contact our office to review his meds.  Advised Pt would have pharmacy review his meds and advise.

## 2022-04-05 ENCOUNTER — Telehealth: Payer: Self-pay | Admitting: Licensed Clinical Social Worker

## 2022-04-05 NOTE — Telephone Encounter (Signed)
H&V Care Navigation CSW Progress Note  Clinical Social Worker contacted patient by phone to f/u on resources sent for care planning and disability and provide general check in. Pt answered at [phone]. He shares it has been a tough couple of weeks and his job has requested he take Short Term Disability. He has benefits through [employer] and has some questions about how to complete forms, I encouraged him to f/u with HR office regarding specifics of forms. Pt will be out of town next week but amenable to me checking in in a few weeks to see how he is doing any answer any additional questions.    Patient is participating in a Managed Medicaid Plan:  No, UHC commercial plan only  SDOH Screenings   Alcohol Screen: Not on file  Depression (YOM6-0): Not on file  Financial Resource Strain: Low Risk  (03/21/2022)   Overall Financial Resource Strain (CARDIA)    Difficulty of Paying Living Expenses: Not very hard  Food Insecurity: No Food Insecurity (03/21/2022)   Hunger Vital Sign    Worried About Running Out of Food in the Last Year: Never true    [place] in the Last Year: Never true  Housing: Low Risk  (03/21/2022)   Housing    Last Housing Risk Score: 0  Physical Activity: Not on file  Social Connections: Not on file  Stress: Not on file  Tobacco Use: Medium Risk (03/26/2022)   Patient History    Smoking Tobacco Use: Former    Smokeless Tobacco Use: Never    Passive Exposure: Not on file  Transportation Needs: No Transportation Needs (03/21/2022)   PRAPARE - Transportation    Lack of Transportation (Medical): No    Lack of Transportation (Non-Medical): No    Westley Hummer, MSW, [place]  [phone]- work cell phone (preferred) [phone]- desk phone

## 2022-04-06 ENCOUNTER — Other Ambulatory Visit: Payer: Self-pay | Admitting: Cardiovascular Disease

## 2022-04-16 ENCOUNTER — Other Ambulatory Visit: Payer: Self-pay | Admitting: Endocrinology

## 2022-04-16 ENCOUNTER — Other Ambulatory Visit (INDEPENDENT_AMBULATORY_CARE_PROVIDER_SITE_OTHER): Payer: 59

## 2022-04-16 DIAGNOSIS — E291 Testicular hypofunction: Secondary | ICD-10-CM

## 2022-04-16 DIAGNOSIS — E1165 Type 2 diabetes mellitus with hyperglycemia: Secondary | ICD-10-CM | POA: Diagnosis not present

## 2022-04-16 DIAGNOSIS — Z794 Long term (current) use of insulin: Secondary | ICD-10-CM | POA: Diagnosis not present

## 2022-04-16 LAB — TESTOSTERONE: Testosterone: 209.75 ng/dL — ABNORMAL LOW (ref 300.00–890.00)

## 2022-04-16 LAB — HEMOGLOBIN A1C: Hgb A1c MFr Bld: 7.4 % — ABNORMAL HIGH (ref 4.6–6.5)

## 2022-04-16 LAB — GLUCOSE, RANDOM: Glucose, Bld: 182 mg/dL — ABNORMAL HIGH (ref 70–99)

## 2022-04-17 ENCOUNTER — Ambulatory Visit: Payer: 59 | Admitting: Endocrinology

## 2022-04-19 ENCOUNTER — Encounter: Payer: Self-pay | Admitting: Pharmacist

## 2022-04-19 ENCOUNTER — Other Ambulatory Visit: Payer: Self-pay

## 2022-04-19 ENCOUNTER — Ambulatory Visit (INDEPENDENT_AMBULATORY_CARE_PROVIDER_SITE_OTHER): Payer: 59 | Admitting: Pharmacist

## 2022-04-19 VITALS — BP 136/76 | HR 88

## 2022-04-19 DIAGNOSIS — E1165 Type 2 diabetes mellitus with hyperglycemia: Secondary | ICD-10-CM

## 2022-04-19 DIAGNOSIS — Z794 Long term (current) use of insulin: Secondary | ICD-10-CM

## 2022-04-19 DIAGNOSIS — I10 Essential (primary) hypertension: Secondary | ICD-10-CM

## 2022-04-19 DIAGNOSIS — N1832 Chronic kidney disease, stage 3b: Secondary | ICD-10-CM

## 2022-04-19 DIAGNOSIS — Z952 Presence of prosthetic heart valve: Secondary | ICD-10-CM

## 2022-04-19 DIAGNOSIS — Z7901 Long term (current) use of anticoagulants: Secondary | ICD-10-CM

## 2022-04-19 DIAGNOSIS — I4891 Unspecified atrial fibrillation: Secondary | ICD-10-CM

## 2022-04-19 NOTE — Patient Instructions (Addendum)
Since you have been taking both chlorthalidone and HCTZ, we will discontinue HCTZ for now  We will also hold doxazosin for now  We will check your labs to see your liver and kidney function  Karren Cobble, PharmD, [place], [place], [place], [place], [place], [address] Phone: [phone], Fax: [phone]

## 2022-04-19 NOTE — Progress Notes (Signed)
Patient ID: Robert [name]                 DOB: [birth year]                      MRN: [id number]     HPI: Robert [name] is a 61 y.o. male referred by Dr. Claiborne Billings to HTN clinic. Patient has complicated PMH. Has one kidney and is post transplant. Followed by [facility]. Other PMH includes HTN, SVT, CVA, A fib, OSA, CKD, and T2DM.    Patient called clinic on 6/28 reporting dizziness.  Blood pressure was elevated.  Patient presents today feeling poorly.  Walks very slowly with cane. Reports dizziness and SOB. Denies chest pain. Does not feel like he is in A fib or having SVTs.  Has no swelling or edema.  Reports home systolic BP between 270-350.  Home blood sugars between 54-300.    During med rec, patient reported he is taking HCTZ '25mg'$  (doubled by [facility]) and chlorthalidone '25mg'$ .  Checked last records from [facility] and notes instructed to d/c HCTZ when chlorthalidone was started.  Patient misunderstood.  Current HTN meds:  Amlodipine '5mg'$  daily Carvedilol '25mg'$  BID Dozazosin '1mg'$  daily Hydralazine '100mg'$  TID HCTZ '25mg'$  daily Chlorthalidone '25mg'$   Furosemide '20mg'$  daily   Home BP readings:   Wt Readings from Last 3 Encounters:  03/15/22 263 lb 3.2 oz (119.4 kg)  03/13/22 269 lb (122 kg)  03/12/22 268 lb 6.4 oz (121.7 kg)   BP Readings from Last 3 Encounters:  03/15/22 (!) 142/60  03/13/22 120/60  03/12/22 (!) 160/70   Pulse Readings from Last 3 Encounters:  03/15/22 69  03/13/22 76  03/12/22 73    Renal function: CrCl cannot be calculated (Patient's most recent lab result is older than the maximum 21 days allowed.).  Past Medical History:  Diagnosis Date   Acute cystitis without hematuria 11/09/2021   AKI (acute kidney injury) 08/08/2017   Elevated sCr to 2.5 (baseline ~1.7) noted at OSH - Subsequent transfer to [facility]  - Currently on IVMF in the setting of diarrhea and AKI - sCr at baseline at time of discharge   Anemia in chronic kidney disease 03/30/2015   Anticoagulated on Coumadin  11/09/2021   Aortic valve stenosis    Arthritis    Atrial fibrillation 11/29/2021   Benign essential hypertension 07/30/2014   prior to transplant pt took Metorpolol tartrate '50mg'$  qd on off HD days   Benign neoplasm of transverse colon    Cancer    skin ca right shoulder, plastic dsyplasia, pre-Ca polpys removed on Colonoscopy- 07/2014   Cerebrovascular accident (CVA) due to embolism of precerebral artery    Chronic renal failure    Colon polyps 07/23/2014   Tubular adenomas x 5   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   Diverticulosis of colon without hemorrhage    Eczema    ESRD (end stage renal disease)    hemodialysis 06/2014-02/2017, s/p living unrelated kidney transplant 02/26/17   Exertional dyspnea    Fatigue 01/08/2014   Fissure of skin 03/08/2020   GI bleed 07/26/2017   Headache    Heart murmur    aortic stenosis (moderate-severe 04/2021)   History of COVID-19 2023   History of unilateral nephrectomy  09/06/2013   Hydrocele, acquired 04/07/2017   Hyperlipidemia 09/06/2013   Hypertensive urgency 10/21/2021   Hyponatremia 11/09/2021   Hypothyroidism    Immunosuppressed status 02/26/2017   Induction agent: Solumedrol - Envarsus '3mg'$  daily  - Home dose decreased from '5mg'$  to '3mg'$  in the setting of FK 15.7 while inpatient   - Previously on Prograf, though d/c'ed during previous admission; suspected this may be causing hallucinations  - Cellcept '1000mg'$  Q12H  - Prednisone '5mg'$  daily   Inguinal hernia without obstruction or gangrene 06/30/2017   Right inguinal wall defect appreciated by CT imaging [place] and on exam Dr. Paulla Fore today Dr. Paulla Fore has discussed hernia repair with mesh at some point in near future  Last Assessment & Plan:  Formatting of this note  might be different from the original. Right inguinal wall defect appreciated by CT imaging [place] and on exam Dr. Paulla Fore today Dr. Paulla Fore has discussed hern   Kidney transplanted 02/26/2017   Date of Transplant = 02/26/17  ABO (Donor/Recipient) = compatible Blood type: O+/A+ DonorType: Living donor, unrelated Allograft type:Left kidney Donor anatomy: 2 arteries, 1 veins and single ureter  Pants-to-skirt anastomosis of dual renal arteries on the backtable. Donor Kidney Bx: Not applicable Allograft injury/complications: None Ne   Lower extremity edema 09/06/2013   Macular degeneration    Mild vascular neurocognitive disorder 02/08/2022   OAB (overactive bladder) 12/28/2019   Obstructive sleep apnea 09/06/2013   On Bi-PAP  Last Assessment & Plan:  Formatting of this note might be different from the original. - History of OSA, continue CPAP at night   Peritoneal dialysis catheter in place 02/24/2015   RLQ abdominal pain 04/19/2020   S/P AVR (aortic valve replacement) 10/05/2021   SBO (small bowel obstruction) 05/2015   Scrotal edema 03/14/2017   Testicular ultrasound did show hydrocele  Will refer to urology regarding ongoign swelling, hydrocele.   Last Assessment & Plan:  Formatting of this note might be different from the original.   Testicular ultrasound did show hydrocele  Will refer to urology regarding ongoign swelling, hydrocele.   Shortness of breath    with exertion   Stage 3b chronic kidney disease (CKD) 03/22/2014   SVT (supraventricular tachycardia) 09/06/2013   Tremor of both hands 06/29/2017   Last Assessment & Plan:  Present since transplant, stable and unchanged Query from tacrolimus -Improved tremors noted and verbalized by pt - Prograf doses titrated per FK levels  Last Assessment & Plan:  Formatting of this note might be different from the original. Present since transplant, stable and unchanged Query from tacrolimus -Improved tremors noted and verbalized by pt - Prograf doses  titr   Type 2 diabetes mellitus with hyperglycemia, with long-term current use of insulin 30/86/5784   Umbilical hernia    s/p repair 04/28/15    Current Outpatient Medications on File Prior to Visit  Medication Sig Dispense Refill   acetaminophen (TYLENOL) 325 MG tablet Take 2 tablets (650 mg total) by mouth every 4 (four) hours as needed for moderate pain. (Patient not taking: Reported on 03/15/2022)     amLODipine (NORVASC) 5 MG tablet Take 1 tablet (5 mg total) by mouth daily. (Patient not taking: Reported on 03/15/2022) 90 tablet 3   apixaban (ELIQUIS) 5 MG TABS tablet Take 1 tablet (5 mg total) by mouth 2 (two) times daily. Stop coumadin, monitor the PT/INR, and start Eliquis when the INR is <2. 180 tablet 3   BD DISP NEEDLE 23G X 1" MISC USE TO  INJECT TESTOSTERONE MEDICATION 100 each 1   belatacept (NULOJIX) 250 MG SOLR injection Inject 24.5 mLs into the vein every 28 (twenty-eight) days.      carvedilol (COREG) 25 MG tablet Take 1 tablet (25 mg total) by mouth 2 (two) times daily with a meal.     cloNIDine (CATAPRES) 0.1 MG tablet Take 0.1 mg by mouth every 8 (eight) hours as needed (SBP >160). (Patient not taking: Reported on 03/15/2022)     Continuous Blood Gluc Sensor (FREESTYLE LIBRE 3 SENSOR) MISC SMARTSIG:1 Topical Every 2 Weeks     doxazosin (CARDURA) 2 MG tablet Take 0.5 tablets (1 mg total) by mouth daily. Take 0.5 Tablet Daily 45 tablet 3   DULoxetine (CYMBALTA) 60 MG capsule Take 60 mg by mouth daily.     furosemide (LASIX) 20 MG tablet Take 20 mg by mouth as needed. (Patient not taking: Reported on 03/15/2022)     hydrALAZINE (APRESOLINE) 100 MG tablet TAKE 1 TABLET BY MOUTH EVERY 8 HOURS 270 tablet 1   hydrochlorothiazide (MICROZIDE) 12.5 MG capsule Take 12.5 mg by mouth daily.     insulin aspart (NOVOLOG) 100 UNIT/ML injection Inject into the skin 3 (three) times daily before meals.     Insulin Pen Needle (B-D UF III MINI PEN NEEDLES) 31G X 5 MM MISC Use to inject insulin 4 times a  day 300 each 2   levothyroxine (SYNTHROID) 112 MCG tablet Take 1 tablet (112 mcg total) by mouth daily. 90 tablet 3   mycophenolate (CELLCEPT) 250 MG capsule Take 1,000 mg by mouth every 12 (twelve) hours.      nitroGLYCERIN (NITROSTAT) 0.4 MG SL tablet Place 1 tablet (0.4 mg total) under the tongue every 5 (five) minutes as needed for chest pain. 90 tablet 3   NON FORMULARY CPAP/BIPAP  at bedtime  BIPAP per pt     predniSONE (DELTASONE) 5 MG tablet Take 5 mg by mouth daily with breakfast. Continuously     rosuvastatin (CRESTOR) 20 MG tablet Take 1 tablet (20 mg total) by mouth daily. 90 tablet 3   senna-docusate (SENOKOT-S) 8.6-50 MG tablet Take 1 tablet by mouth at bedtime as needed for mild constipation.     traMADol (ULTRAM) 50 MG tablet Take 50 mg by mouth every 4 (four) hours as needed. (Patient not taking: Reported on 03/15/2022)     traZODone (DESYREL) 50 MG tablet Take 50 mg by mouth See admin instructions. As needed at bedtime for insomnia x 14 days     zolpidem (AMBIEN) 5 MG tablet Take 5 mg by mouth at bedtime as needed for sleep. (Patient not taking: Reported on 03/15/2022)     No current facility-administered medications on file prior to visit.    No Known Allergies   Assessment/Plan:  1. Hypertension -  Patient BP in room 136/76 which is reassuring however patient feels poorly. Instructed patient to d/c HCTZ as he does not need 2 thiazide diuretics.  Will check CMP to check renal function, electrolytes and hepatic function.   Will hold doxazosin at this time to see if that helps with reducing dizziness.  Patient voiced understanding.   Continue: Amlodipine '5mg'$  daily Carvedilol '25mg'$  BID Hydralazine '100mg'$  TID Chlorthalidone '25mg'$   Furosemide '20mg'$  daily as needed D/C HCTZ D/C doxazosin at this time Check CMP  Karren Cobble, PharmD, [place], [place], [place], [place], [place], [address] Phone: [phone], Fax: [phone]

## 2022-04-20 ENCOUNTER — Encounter: Payer: Self-pay | Admitting: Endocrinology

## 2022-04-20 ENCOUNTER — Ambulatory Visit: Payer: 59 | Admitting: Endocrinology

## 2022-04-20 ENCOUNTER — Telehealth: Payer: Self-pay | Admitting: Licensed Clinical Social Worker

## 2022-04-20 VITALS — BP 122/70 | HR 76 | Ht 71.0 in | Wt 265.0 lb

## 2022-04-20 DIAGNOSIS — E1165 Type 2 diabetes mellitus with hyperglycemia: Secondary | ICD-10-CM | POA: Diagnosis not present

## 2022-04-20 DIAGNOSIS — Z794 Long term (current) use of insulin: Secondary | ICD-10-CM

## 2022-04-20 DIAGNOSIS — E039 Hypothyroidism, unspecified: Secondary | ICD-10-CM

## 2022-04-20 DIAGNOSIS — E291 Testicular hypofunction: Secondary | ICD-10-CM

## 2022-04-20 DIAGNOSIS — Z6835 Body mass index (BMI) 35.0-35.9, adult: Secondary | ICD-10-CM

## 2022-04-20 LAB — COMPREHENSIVE METABOLIC PANEL
ALT: 11 IU/L (ref 0–44)
AST: 14 IU/L (ref 0–40)
Albumin/Globulin Ratio: 2.4 — ABNORMAL HIGH (ref 1.2–2.2)
Albumin: 4.8 g/dL (ref 3.9–4.9)
Alkaline Phosphatase: 88 IU/L (ref 44–121)
BUN/Creatinine Ratio: 19 (ref 10–24)
BUN: 43 mg/dL — ABNORMAL HIGH (ref 8–27)
Bilirubin Total: 0.4 mg/dL (ref 0.0–1.2)
CO2: 19 mmol/L — ABNORMAL LOW (ref 20–29)
Calcium: 10.9 mg/dL — ABNORMAL HIGH (ref 8.6–10.2)
Chloride: 99 mmol/L (ref 96–106)
Creatinine, Ser: 2.3 mg/dL — ABNORMAL HIGH (ref 0.76–1.27)
Globulin, Total: 2 g/dL (ref 1.5–4.5)
Glucose: 227 mg/dL — ABNORMAL HIGH (ref 70–99)
Potassium: 4.2 mmol/L (ref 3.5–5.2)
Sodium: 139 mmol/L (ref 134–144)
Total Protein: 6.8 g/dL (ref 6.0–8.5)
eGFR: 31 mL/min/{1.73_m2} — ABNORMAL LOW (ref >59)

## 2022-04-20 MED ORDER — "BD DISP NEEDLE 23G X 1"" MISC": 1 refills | Status: DC

## 2022-04-20 MED ORDER — BD HYPODERMIC NEEDLE 18G X 1" MISC
2.0000 | 1 refills | Status: DC
Start: 2022-04-20 — End: 2022-07-11

## 2022-04-20 MED ORDER — SYRINGE (DISPOSABLE) 1 ML MISC
0 refills | Status: DC
Start: 2022-04-20 — End: 2022-07-11

## 2022-04-20 NOTE — Patient Instructions (Signed)
Bolus with carb entry and sugar level 30 min before meals and large snacks

## 2022-04-20 NOTE — Telephone Encounter (Signed)
H&V Care Navigation CSW Progress Note  Clinical Social Worker contacted patient by phone to f/u on resources regarding disability and general check in. Pt shares vacation was only "okay", he is frustrated bc he was told his short term disability was denied. I shared that pt should contact his HR/benefits department to discuss his options. Pt inquires if paperwork ever got to Dr. Evette Georges office- I do not see any paperwork on the chart and let pt know I would check with front desk staff who completes paperwork for the office to see if anything received.   I messaged Marolyn Hammock, Patient [employer], who shares that she had not received any paperwork for pt. I called pt back and relayed this information, again encouraging him to speak with benefits department to ensure things are being sent correctly and completed fully. Pt and I also discussed speaking with attorney if he would like guidance with completing social security disability- I am not able to advise on a particular lawyer but shared that if financially feasible some patients find this to be a big help in managing applications and f/u. Encouraged him to call me if any additional questions/concerns, I will not actively follow but remain available.   Patient is participating in a Managed Medicaid Plan:  No, UHC commercial plan only  SDOH Screenings   Alcohol Screen: Not on file  Depression (FMB8-4): Not on file  Financial Resource Strain: Low Risk  (03/21/2022)   Overall Financial Resource Strain (CARDIA)    Difficulty of Paying Living Expenses: Not very hard  Food Insecurity: No Food Insecurity (03/21/2022)   Hunger Vital Sign    Worried About Running Out of Food in the Last Year: Never true    [place] in the Last Year: Never true  Housing: Low Risk  (03/21/2022)   Housing    Last Housing Risk Score: 0  Physical Activity: Not on file  Social Connections: Not on file  Stress: Not on file  Tobacco Use: Medium Risk (04/19/2022)    Patient History    Smoking Tobacco Use: Former    Smokeless Tobacco Use: Never    Passive Exposure: Not on file  Transportation Needs: No Transportation Needs (03/21/2022)   PRAPARE - Transportation    Lack of Transportation (Medical): No    Lack of Transportation (Non-Medical): No    Westley Hummer, MSW, [employer]  [phone]- work cell phone (preferred) [phone]- desk phone

## 2022-04-20 NOTE — Progress Notes (Unsigned)
Patient ID: Robert [name], male   DOB: 1961/08/01, 61 y.o.   MRN: [id number]     Reason for Appointment: Endocrinology follow-up  History of Present Illness   DIABETES diagnosis date:  2012  Previous history: His blood sugar has been difficult to control since onset. Initially was taking oral hypoglycemic drugs like glyburide but could not take metformin because of renal dysfunction. Since Victoza did not help his control he was started on Lantus and then U 500 insulin also His A1c has previously ranged from 7.8-9.8, the lowest level in 03/2013 He was on Victoza but because of his markedly decreased appetite and weight loss this was stopped in 9/15  Recent history:   Non-hypoglycemic insulin regimen: was on Victoza 1.8 mg daily  His A1c is 7.4, was 8.4 Fructosamine previously 295  OMNIPOD pump settings with U-500 insulin:  Basal rate 12 AM = 0.05, 3 AM = 0.15, 7 AM-8:30 PM = 0.9, 8:30 PM = 0.05  Carbohydrate ratio 1:10, correction factor I: 60  Bolus insulin with NovoLog FlexPen variable doses 10-15, currently none  Current blood sugar patterns, management and problems identified:  He has been using his OmniPod pump but still will take extra boluses of about 15 units when blood sugars are over about 250 He is not getting enough boluses and infrequent boluses as before Also appears to be requiring more BASAL insulin than before Does not appear to respond as quickly to U-500 insulin with boluses and these are inadequate also when used, mostly getting about 2 units for boluses He realized that he is not properly using the bolus calculator for his PDM  However even with 15 units of NovoLog his blood sugars come down fairly quickly and occasionally overshoot when he has high readings He does not want to use the Dexcom because of high cost but is using the [place] 3 as before giving him much better coverage with 97% active time Previously has been reluctant to use GLP-1  drugs because of nonspecific side effects and cost   Proper timing of medications in relation to meals: Yes.          Blood sugar data analysis from freestyle libre version 3 for the last 2 weeks:  His blood sugars are highly inconsistent from day-to-day but generally averaging about 200 or more on a daily basis  HYPERGLYCEMIA episodes are occurring erratically at different times with at least 3 significant episodes recently  Also at times hypoglycemia is persisting for several hours OVERNIGHT and otherwise may sporadically go up after mealtimes to different extents  Hyperglycemia may occasionally exceed 350 again at different times except overnight  HIGHEST blood sugars overall are late morning averaging 229  POSTPRANDIAL hyperglycemia in the last 3 to 4 days has occurred mostly after breakfast or dinner  Premeal readings are averaging about 170-190 HYPOGLYCEMIA infrequent, precipitated after high blood sugar episode hypoglycemia   CGM use % of time 97  Average 189/32  Time in range    42    %  % Time Above 180 42  % Time above 250 14  % Time Below 70 2     PRE-MEAL Fasting Lunch Dinner Bedtime Overall  Glucose range:       Averages: 184       POST-MEAL PC Breakfast PC Lunch PC Dinner  Glucose range:     Averages: 229 192 186     Previously  CGM use % of time 95  2-week average/GV 63/35  Time in range       59%  % Time Above 180 31  % Time above 250 7  % Time Below 70 3     PRE-MEAL Fasting Lunch Dinner Bedtime Overall  Glucose range:       Averages: 145       POST-MEAL PC Breakfast PC Lunch PC Dinner  Glucose range:     Averages: 186  191 161     Meals: 2-3 meals per day. 7-8 am and Noon and Dinner 5-7 PM .              Weight control:   Wt Readings from Last 3 Encounters:  04/20/22 265 lb (120.2 kg)  03/15/22 263 lb 3.2 oz (119.4 kg)  03/13/22 269 lb (914 kg)        Complications:   nephropathy, neuropathy   Diabetes labs:   Lab Results   Component Value Date   HGBA1C 7.4 (H) 04/16/2022   HGBA1C 8.4 (H) 01/04/2022   HGBA1C 7.1 (H) 10/03/2021   Lab Results  Component Value Date   MICROALBUR 146.3 (H) 08/03/2019   LDLCALC 65 10/13/2021   CREATININE 2.30 (H) 04/19/2022    Lab Results  Component Value Date   FRUCTOSAMINE 295 (H) 11/28/2021   FRUCTOSAMINE 296 (H) 07/04/2021   FRUCTOSAMINE 276 12/13/2020   Lab Results  Component Value Date   HGB 11.9 (L) 11/24/2021       OTHER problems: See review of systems    Allergies as of 04/20/2022   No Known Allergies      Medication List        Accurate as of April 20, 2022 11:59 PM. If you have any questions, ask your nurse or doctor.          acetaminophen 325 MG tablet Commonly known as: TYLENOL Take 2 tablets (650 mg total) by mouth every 4 (four) hours as needed for moderate pain.   albuterol 108 (90 Base) MCG/ACT inhaler Commonly known as: VENTOLIN HFA SMARTSIG:1 Puff(s) Via Inhaler Every 4 Hours PRN   amLODipine 5 MG tablet Commonly known as: NORVASC Take 1 tablet (5 mg total) by mouth daily.   apixaban 5 MG Tabs tablet Commonly known as: ELIQUIS Take 1 tablet (5 mg total) by mouth 2 (two) times daily. Stop coumadin, monitor the PT/INR, and start Eliquis when the INR is <2.   B-D UF III MINI PEN NEEDLES 31G X 5 MM Misc Generic drug: Insulin Pen Needle Use to inject insulin 4 times a day   BD Disp Needle 23G X 1" Misc Generic drug: NEEDLE (DISP) 23 G USE TO INJECT TESTOSTERONE MEDICATION   BD Hypodermic Needle 18G X 1" Misc Generic drug: NEEDLE (DISP) 18 G Inject 2 each into the muscle every 7 (seven) days. Use 2 needles once weekly for Testosterone medication. Use 1 needle to draw up medication and 2'nd needle to inject into muscle. Started by: Elayne Snare, MD   carvedilol 25 MG tablet Commonly known as: COREG Take 1 tablet (25 mg total) by mouth 2 (two) times daily with a meal.   chlorthalidone 25 MG tablet Commonly known as:  HYGROTON Take 1 tablet by mouth daily.   cloNIDine 0.1 MG tablet Commonly known as: CATAPRES Take 0.1 mg by mouth every 8 (eight) hours as needed (SBP >160).   doxazosin 2 MG tablet Commonly known as: CARDURA Take 0.5 tablets (1 mg total) by mouth daily. Take 0.5 Tablet Daily   DULoxetine 60 MG  capsule Commonly known as: CYMBALTA Take 60 mg by mouth daily.   FreeStyle Libre 3 Sensor Misc SMARTSIG:1 Topical Every 2 Weeks   furosemide 20 MG tablet Commonly known as: LASIX Take 20 mg by mouth as needed.   HUMULIN R 500 UNIT/ML injection Generic drug: insulin regular human CONCENTRATED Inject 125 Units into the skin every 3 (three) days.   hydrALAZINE 100 MG tablet Commonly known as: APRESOLINE TAKE 1 TABLET BY MOUTH EVERY 8 HOURS   insulin aspart 100 UNIT/ML injection Commonly known as: novoLOG Inject into the skin 3 (three) times daily before meals.   levothyroxine 112 MCG tablet Commonly known as: SYNTHROID Take 1 tablet (112 mcg total) by mouth daily.   mycophenolate 250 MG capsule Commonly known as: CELLCEPT Take 1,000 mg by mouth every 12 (twelve) hours.   nitroGLYCERIN 0.3 MG SL tablet Commonly known as: NITROSTAT Place under the tongue.   NON FORMULARY CPAP/BIPAP  at bedtime  BIPAP per pt   Nulojix 250 MG Solr injection Generic drug: belatacept Inject 24.5 mLs into the vein every 28 (twenty-eight) days.   predniSONE 5 MG tablet Commonly known as: DELTASONE Take 5 mg by mouth daily with breakfast. Continuously   rosuvastatin 20 MG tablet Commonly known as: Crestor Take 1 tablet (20 mg total) by mouth daily.   senna-docusate 8.6-50 MG tablet Commonly known as: Senokot-S Take 1 tablet by mouth at bedtime as needed for mild constipation.   Syringe (Disposable) 1 ML Misc Use to draw up 0.74m of testosterone medication Started by: AElayne Snare MD   Testosterone Cypionate 200 MG/ML Soln Inject as directed.   traMADol 50 MG tablet Commonly known  as: ULTRAM Take 50 mg by mouth every 4 (four) hours as needed.   traZODone 50 MG tablet Commonly known as: DESYREL Take 50 mg by mouth See admin instructions. As needed at bedtime for insomnia x 14 days   zolpidem 5 MG tablet Commonly known as: AMBIEN Take 5 mg by mouth at bedtime as needed for sleep.        Allergies: No Known Allergies  Past Medical History:  Diagnosis Date   Acute cystitis without hematuria 11/09/2021   AKI (acute kidney injury) 08/08/2017   Elevated sCr to 2.5 (baseline ~1.7) noted at OSH - [facility] Currently on IVMF in the setting of diarrhea and AKI - sCr at baseline at time of discharge   Anemia in chronic kidney disease 03/30/2015   Anticoagulated on Coumadin 11/09/2021   Aortic valve stenosis    Arthritis    Atrial fibrillation 11/29/2021   Benign essential hypertension 07/30/2014   prior to transplant pt took Metorpolol tartrate '50mg'$  qd on off HD days   Benign neoplasm of transverse colon    Cancer    skin ca right shoulder, plastic dsyplasia, pre-Ca polpys removed on Colonoscopy- 07/2014   Cerebrovascular accident (CVA) due to embolism of precerebral artery    Chronic renal failure    Colon polyps 07/23/2014   Tubular adenomas x 5   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   Diverticulosis of colon without hemorrhage    Eczema    ESRD (end stage renal disease)    hemodialysis 06/2014-02/2017, s/p living unrelated kidney transplant 02/26/17   Exertional dyspnea  Fatigue 01/08/2014   Fissure of skin 03/08/2020   GI bleed 07/26/2017   Headache    Heart murmur    aortic stenosis (moderate-severe 04/2021)   History of COVID-19 2023   History of unilateral nephrectomy 09/06/2013   Hydrocele,  acquired 04/07/2017   Hyperlipidemia 09/06/2013   Hypertensive urgency 10/21/2021   Hyponatremia 11/09/2021   Hypothyroidism    Immunosuppressed status 02/26/2017   Induction agent: Solumedrol - Envarsus '3mg'$  daily  - Home dose decreased from '5mg'$  to '3mg'$  in the setting of FK 15.7 while inpatient   - Previously on Prograf, though d/c'ed during previous admission; suspected this may be causing hallucinations  - Cellcept '1000mg'$  Q12H  - Prednisone '5mg'$  daily   Inguinal hernia without obstruction or gangrene 06/30/2017   Right inguinal wall defect appreciated by CT imaging [place] and on exam Dr. Paulla Fore today Dr. Paulla Fore has discussed hernia repair with mesh at some point in near future  Last Assessment & Plan:  Formatting of this note might be different from the original. Right inguinal wall defect appreciated by CT imaging [place] and on exam Dr. Paulla Fore today Dr. Paulla Fore has discussed hern   Kidney transplanted 02/26/2017   Date of Transplant = 02/26/17  ABO (Donor/Recipient) = compatible Blood type: O+/A+ DonorType: Living donor, unrelated Allograft type:Left kidney Donor anatomy: 2 arteries, 1 veins and single ureter  Pants-to-skirt anastomosis of dual renal arteries on the backtable. Donor Kidney Bx: Not applicable Allograft injury/complications: None Ne   Lower extremity edema 09/06/2013   Macular degeneration    Mild vascular neurocognitive disorder 02/08/2022   OAB (overactive bladder) 12/28/2019   Obstructive sleep apnea 09/06/2013   On Bi-PAP  Last Assessment & Plan:  Formatting of this note might be different from the original. - History of OSA, continue CPAP at night   Peritoneal dialysis catheter in place 02/24/2015   RLQ abdominal pain 04/19/2020   S/P AVR (aortic valve replacement) 10/05/2021   SBO (small bowel obstruction) 05/2015   Scrotal edema 03/14/2017   Testicular ultrasound did show hydrocele  Will refer to urology regarding ongoign swelling, hydrocele.   Last Assessment & Plan:   Formatting of this note might be different from the original.   Testicular ultrasound did show hydrocele  Will refer to urology regarding ongoign swelling, hydrocele.   Shortness of breath    with exertion   Stage 3b chronic kidney disease (CKD) 03/22/2014   SVT (supraventricular tachycardia) 09/06/2013   Tremor of both hands 06/29/2017   Last Assessment & Plan:  Present since transplant, stable and unchanged Query from tacrolimus -Improved tremors noted and verbalized by pt - Prograf doses titrated per FK levels  Last Assessment & Plan:  Formatting of this note might be different from the original. Present since transplant, stable and unchanged Query from tacrolimus -Improved tremors noted and verbalized by pt - Prograf doses titr   Type 2 diabetes mellitus with hyperglycemia, with long-term current use of insulin 04/06/1600   Umbilical hernia    s/p repair 04/28/15    Past Surgical History:  Procedure Laterality Date   AORTIC VALVE REPLACEMENT N/A 10/05/2021   Procedure: AORTIC VALVE REPLACEMENT (AVR) WITH INSPIRIS RESILIA AORTIC VALVE SIZE 27MM;  Surgeon: [name], MD;  Location: [place];  Service: Open Heart Surgery;  Laterality: N/A;   AV FISTULA PLACEMENT Right 02/25/2015   Procedure: RIGHT ARTERIOVENOUS (AV) FISTULA CREATION;  Surgeon: Serafina Mitchell, MD;  Location: [place];  Service: Vascular;  Laterality: Right;  AV FISTULA PLACEMENT Right 09/15/2015   Procedure: ARTERIOVENOUS (AV) FISTULA CREATION- RIGHT ARM;  Surgeon: Serafina Mitchell, MD;  Location: [facility];  Service: Vascular;  Laterality: Right;   CAPD INSERTION N/A 05/18/2014   Procedure: LAPAROSCOPIC INSERTION CONTINUOUS AMBULATORY PERITONEAL DIALYSIS  (CAPD) CATHETER;  Surgeon: Ralene Ok, MD;  Location: [facility];  Service: General;  Laterality: N/A;   COLONOSCOPY     COLONOSCOPY WITH PROPOFOL N/A 07/12/2016   Procedure: COLONOSCOPY WITH PROPOFOL;  Surgeon: [name], MD;  Location: [facility];  Service: Endoscopy;   Laterality: N/A;   CORONARY ARTERY BYPASS GRAFT N/A 10/05/2021   Procedure: CORONARY ARTERY BYPASS GRAFTING (CABG) X 1, ON PUMP, USING LEFT INTERNAL MAMMARY ARTERY CONDUIT;  Surgeon: [name], MD;  Location: [facility];  Service: Open Heart Surgery;  Laterality: N/A;   declotting of fistula  08/2015   ESOPHAGOGASTRODUODENOSCOPY (EGD) WITH PROPOFOL N/A 07/12/2016   Procedure: ESOPHAGOGASTRODUODENOSCOPY (EGD) WITH PROPOFOL;  Surgeon: [name], MD;  Location: [facility];  Service: Endoscopy;  Laterality: N/A;   FISTULA SUPERFICIALIZATION Right 02/07/2016   Procedure: RIIGHT UPPER ARM FISTULA SUPERFICIALIZATION;  Surgeon: Elam Dutch, MD;  Location: [facility];  Service: Vascular;  Laterality: Right;   IJ catheter insertion     INSERTION OF DIALYSIS CATHETER Right 02/25/2015   Procedure: INSERTION OF RIGHT INTERNAL JUGULAR DIALYSIS CATHETER;  Surgeon: Serafina Mitchell, MD;  Location: [facility];  Service: Vascular;  Laterality: Right;   KIDNEY TRANSPLANT     LAPAROSCOPIC REPOSITIONING CAPD CATHETER N/A 06/16/2014   Procedure: LAPAROSCOPIC REPOSITIONING CAPD CATHETER;  Surgeon: Ralene Ok, MD;  Location: [facility];  Service: General;  Laterality: N/A;   LAPAROSCOPY N/A 05/04/2015   Procedure: LAPAROSCOPY DIAGNOSTIC LYSIS OF ADHESIONS;  Surgeon: Coralie Keens, MD;  Location: [facility];  Service: General;  Laterality: N/A;   MINOR REMOVAL OF PERITONEAL DIALYSIS CATHETER N/A 04/28/2015   Procedure:  REMOVAL OF PERITONEAL DIALYSIS CATHETER;  Surgeon: Coralie Keens, MD;  Location: [place];  Service: General;  Laterality: N/A;   NEPHRECTOMY Left 1974   RENAL BIOPSY Right 2012   REVISON OF ARTERIOVENOUS FISTULA Right 05/26/2015   Procedure: SUPERFICIALIZATION OF ARTERIOVENOUS FISTULA WITH SIDE BRANCH LIGATIONS;  Surgeon: Serafina Mitchell, MD;  Location: [facility];  Service: Vascular;  Laterality: Right;   RIGHT/LEFT HEART CATH AND CORONARY ANGIOGRAPHY N/A 08/22/2021   Procedure: RIGHT/LEFT HEART CATH AND CORONARY  ANGIOGRAPHY;  Surgeon: Troy Sine, MD;  Location: [place] [facility];  Service: Cardiovascular;  Laterality: N/A;   SKIN CANCER EXCISION     right shoulder   SVT ABLATION N/A 11/23/2016   Procedure: SVT Ablation;  Surgeon: Will Meredith Leeds, MD;  Location: [place] [facility];  Service: Cardiovascular;  Laterality: N/A;   TEE WITHOUT CARDIOVERSION N/A 10/05/2021   Procedure: TRANSESOPHAGEAL ECHOCARDIOGRAM (TEE);  Surgeon: [name], MD;  Location: [place];  Service: Open Heart Surgery;  Laterality: N/A;   UMBILICAL HERNIA REPAIR N/A 05/18/2014   Procedure: HERNIA REPAIR UMBILICAL ADULT;  Surgeon: Ralene Ok, MD;  Location: [facility];  Service: General;  Laterality: N/A;   UMBILICAL HERNIA REPAIR N/A 04/28/2015   Procedure: UMBILICAL HERNIA REPAIR WITH MESH;  Surgeon: Coralie Keens, MD;  Location: [facility];  Service: General;  Laterality: N/A;    Family History  Problem Relation Age of Onset   Colon polyps Father    Stroke Father    Dementia Father        vascular   Colon  cancer Neg Hx    Stomach cancer Neg Hx     Social History:  reports that he quit smoking about 9 years ago. His smoking use included cigars. He has never used smokeless tobacco. He reports that he does not currently use alcohol. He reports that he does not use drugs.  Review of Systems:  Hypertension:    His blood pressure has been managed by his nephrologist and transplant team monitors BP at home periodically  BP Readings from Last 3 Encounters:  04/20/22 122/70  04/19/22 136/76  03/15/22 (!) 142/60    Lipids:  His LDL is below 70 with 40 mg Crestor Has no evidence of vascular disease     Lab Results  Component Value Date   CHOL 109 10/13/2021   CHOL 148 07/04/2021   CHOL 147 08/31/2020   Lab Results  Component Value Date   HDL 31 (L) 10/13/2021   HDL 69.70 07/04/2021   HDL 53.40 08/31/2020   Lab Results  Component Value Date   LDLCALC 65 10/13/2021   LDLCALC 67 07/04/2021   LDLCALC  75 08/31/2020   Lab Results  Component Value Date   TRIG 66 10/13/2021   TRIG 54.0 07/04/2021   TRIG 97.0 08/31/2020   Lab Results  Component Value Date   CHOLHDL 3.5 10/13/2021   CHOLHDL 2 07/04/2021   CHOLHDL 3 08/31/2020   No results found for: "LDLDIRECT"   HYPOTHYROIDISM: He has had long-standing hypothyroidism  Has been taking his 100 g levothyroxine every morning However complaining of significant fatigue  He has taken his levothyroxine daily  Lab Results  Component Value Date   TSH 5.23 01/04/2022       HYPOGONADISM: 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  When testosterone level was low he was having fatigue and decreased motivation and because of his  he was prescribed He has been using AndroGel since February 2019 but subsequently stopped this because of cost  Current regimen is Depo testosterone 100 mg weekly since 11/2021 However he says that he has difficulty getting refills for his syringes and needles and has not taken testosterone  Pretreatment free testosterone level low at 3.7     Lab Results  Component Value Date   TESTOSTERONE 209.75 (L) 04/16/2022   TESTOSTERONE 641.61 11/28/2021   TESTOSTERONE 488.06 03/23/2021   Lab Results  Component Value Date   HGB 11.9 (L) 11/24/2021    He has depression, taking 30 mg of Cymbalta; followed by PCP  Diabetic neuropathy: Has mild sensory loss on his last exam Followed by podiatrist    Examination:   BP 122/70   Pulse 76   Ht '5\' 11"'$  (1.803 m)   Wt 265 lb (120.2 kg)   SpO2 95%   BMI 36.96 kg/m   Body mass index is 36.96 kg/m.    ASSESSMENT/ PLAN:    Diabetes type 2 with obesity, insulin-requiring:   See history of present illness for detailed discussion of his current management, problems identified and glycemic patterns on his freestyle libre sensor download  Has been on the OmniPod pump with U-500 recently  A1c is 7.4 Recent GMI  7.8  Management is still suboptimal Also appears to be requiring more insulin than before Does not appear to respond as quickly to U-500 insulin with boluses and these are inadequate also  However even with 15 units of NovoLog his blood sugars come down fairly quickly and occasionally overshoot when he has high readings  Generally  he has daytime hyperglycemia from inadequate insulin both basal and bolus  Recommendations: Start taking BOLUSES before any meal or large snack which has carbohydrate  Enter blood sugar and carbohydrates at the time of boluses Emphasized the need to bolus 30 minutes before eating  He will avoid taking more than 10 to 12 units of NovoLog to avoid overcorrection for high readings postprandially BASAL rate changes as below, MyChart message sent with adjustments Once he finishes his supply of Humulin R likely can benefit from putting NovoLog in his insulin pump for better onset of action  Midnight = 0.15, 6 AM = 2.2, 11 AM = 1 point 4 and 5 PM = 0.20 Bolus settings: Insulin to carb ratio 1:15 Sensitivity or ISF 45 instead of 60  He will follow-up with the diabetes educator to review his management and short falls and try to improve his pump bolusing, meal planning and Reducing Carbohydrate Intake and Snacks  New prescriptions for syringes and needles given and he will restart his injections of 0.6 mg weekly  Check testosterone level before the next visit  HYPOTHYROIDISM: Follow-up labs on the next visit   Patient Instructions  Bolus with carb entry and sugar level 30 min before meals and large snacks     Shya Kovatch 04/21/2022, 3:10 PM    Note: This office note was prepared with Dragon voice recognition system technology. Any transcriptional errors that result from this process are unintentional.

## 2022-04-21 ENCOUNTER — Encounter: Payer: Self-pay | Admitting: Endocrinology

## 2022-04-25 ENCOUNTER — Telehealth: Payer: Self-pay | Admitting: Cardiovascular Disease

## 2022-04-25 ENCOUNTER — Encounter: Payer: Self-pay | Admitting: Endocrinology

## 2022-04-25 NOTE — Telephone Encounter (Signed)
Called [employer]-advised forms not received or release of information.   Verbalized understanding

## 2022-04-25 NOTE — Telephone Encounter (Signed)
Ref # [id number] Ronalee Belts - [employer] calling to get information on medication and treatment plan. Requesting call back.

## 2022-04-30 ENCOUNTER — Emergency Department (HOSPITAL_COMMUNITY): Payer: 59

## 2022-04-30 ENCOUNTER — Inpatient Hospital Stay (HOSPITAL_COMMUNITY)
Admission: EM | Admit: 2022-04-30 | Discharge: 2022-05-03 | DRG: 690 | Disposition: A | Discharge status: Home Health | Payer: 59 | Attending: Internal Medicine | Admitting: Internal Medicine

## 2022-04-30 ENCOUNTER — Encounter (HOSPITAL_COMMUNITY): Payer: Self-pay

## 2022-04-30 ENCOUNTER — Telehealth: Payer: Self-pay | Admitting: Cardiovascular Disease

## 2022-04-30 ENCOUNTER — Other Ambulatory Visit: Payer: Self-pay

## 2022-04-30 DIAGNOSIS — N10 Acute tubulo-interstitial nephritis: Secondary | ICD-10-CM | POA: Diagnosis not present

## 2022-04-30 DIAGNOSIS — Z952 Presence of prosthetic heart valve: Secondary | ICD-10-CM | POA: Diagnosis not present

## 2022-04-30 DIAGNOSIS — Z940 Kidney transplant status: Secondary | ICD-10-CM | POA: Diagnosis not present

## 2022-04-30 DIAGNOSIS — Z953 Presence of xenogenic heart valve: Secondary | ICD-10-CM

## 2022-04-30 DIAGNOSIS — Z8371 Family history of colonic polyps: Secondary | ICD-10-CM | POA: Diagnosis not present

## 2022-04-30 DIAGNOSIS — N390 Urinary tract infection, site not specified: Principal | ICD-10-CM

## 2022-04-30 DIAGNOSIS — Z66 Do not resuscitate: Secondary | ICD-10-CM | POA: Diagnosis present

## 2022-04-30 DIAGNOSIS — G4733 Obstructive sleep apnea (adult) (pediatric): Secondary | ICD-10-CM | POA: Diagnosis present

## 2022-04-30 DIAGNOSIS — D84821 Immunodeficiency due to drugs: Secondary | ICD-10-CM | POA: Diagnosis present

## 2022-04-30 DIAGNOSIS — Z951 Presence of aortocoronary bypass graft: Secondary | ICD-10-CM

## 2022-04-30 DIAGNOSIS — D631 Anemia in chronic kidney disease: Secondary | ICD-10-CM | POA: Diagnosis present

## 2022-04-30 DIAGNOSIS — E039 Hypothyroidism, unspecified: Secondary | ICD-10-CM

## 2022-04-30 DIAGNOSIS — Z794 Long term (current) use of insulin: Secondary | ICD-10-CM | POA: Diagnosis not present

## 2022-04-30 DIAGNOSIS — Z8619 Personal history of other infectious and parasitic diseases: Secondary | ICD-10-CM

## 2022-04-30 DIAGNOSIS — Z823 Family history of stroke: Secondary | ICD-10-CM

## 2022-04-30 DIAGNOSIS — G459 Transient cerebral ischemic attack, unspecified: Secondary | ICD-10-CM

## 2022-04-30 DIAGNOSIS — Z87891 Personal history of nicotine dependence: Secondary | ICD-10-CM

## 2022-04-30 DIAGNOSIS — I2510 Atherosclerotic heart disease of native coronary artery without angina pectoris: Secondary | ICD-10-CM | POA: Diagnosis present

## 2022-04-30 DIAGNOSIS — I480 Paroxysmal atrial fibrillation: Secondary | ICD-10-CM | POA: Diagnosis present

## 2022-04-30 DIAGNOSIS — R200 Anesthesia of skin: Secondary | ICD-10-CM | POA: Diagnosis not present

## 2022-04-30 DIAGNOSIS — D849 Immunodeficiency, unspecified: Secondary | ICD-10-CM | POA: Diagnosis not present

## 2022-04-30 DIAGNOSIS — I129 Hypertensive chronic kidney disease with stage 1 through stage 4 chronic kidney disease, or unspecified chronic kidney disease: Secondary | ICD-10-CM | POA: Diagnosis present

## 2022-04-30 DIAGNOSIS — E1165 Type 2 diabetes mellitus with hyperglycemia: Secondary | ICD-10-CM | POA: Diagnosis present

## 2022-04-30 DIAGNOSIS — Z86010 Personal history of colonic polyps: Secondary | ICD-10-CM | POA: Diagnosis not present

## 2022-04-30 DIAGNOSIS — E1140 Type 2 diabetes mellitus with diabetic neuropathy, unspecified: Secondary | ICD-10-CM | POA: Diagnosis present

## 2022-04-30 DIAGNOSIS — N1832 Chronic kidney disease, stage 3b: Secondary | ICD-10-CM | POA: Diagnosis present

## 2022-04-30 DIAGNOSIS — E1142 Type 2 diabetes mellitus with diabetic polyneuropathy: Secondary | ICD-10-CM

## 2022-04-30 DIAGNOSIS — I10 Essential (primary) hypertension: Secondary | ICD-10-CM

## 2022-04-30 DIAGNOSIS — Z7901 Long term (current) use of anticoagulants: Secondary | ICD-10-CM

## 2022-04-30 DIAGNOSIS — E669 Obesity, unspecified: Secondary | ICD-10-CM | POA: Diagnosis present

## 2022-04-30 DIAGNOSIS — Z79890 Hormone replacement therapy (postmenopausal): Secondary | ICD-10-CM

## 2022-04-30 DIAGNOSIS — Z6836 Body mass index (BMI) 36.0-36.9, adult: Secondary | ICD-10-CM | POA: Diagnosis not present

## 2022-04-30 DIAGNOSIS — Z7952 Long term (current) use of systemic steroids: Secondary | ICD-10-CM

## 2022-04-30 DIAGNOSIS — Z8616 Personal history of COVID-19: Secondary | ICD-10-CM

## 2022-04-30 DIAGNOSIS — E1122 Type 2 diabetes mellitus with diabetic chronic kidney disease: Secondary | ICD-10-CM | POA: Diagnosis present

## 2022-04-30 DIAGNOSIS — I4891 Unspecified atrial fibrillation: Secondary | ICD-10-CM | POA: Diagnosis present

## 2022-04-30 DIAGNOSIS — R29703 NIHSS score 3: Secondary | ICD-10-CM | POA: Diagnosis not present

## 2022-04-30 DIAGNOSIS — R531 Weakness: Secondary | ICD-10-CM | POA: Diagnosis not present

## 2022-04-30 DIAGNOSIS — N12 Tubulo-interstitial nephritis, not specified as acute or chronic: Secondary | ICD-10-CM | POA: Diagnosis present

## 2022-04-30 DIAGNOSIS — Z905 Acquired absence of kidney: Secondary | ICD-10-CM

## 2022-04-30 DIAGNOSIS — R29898 Other symptoms and signs involving the musculoskeletal system: Secondary | ICD-10-CM | POA: Diagnosis not present

## 2022-04-30 DIAGNOSIS — R310 Gross hematuria: Secondary | ICD-10-CM | POA: Diagnosis present

## 2022-04-30 DIAGNOSIS — Z8719 Personal history of other diseases of the digestive system: Secondary | ICD-10-CM

## 2022-04-30 LAB — BASIC METABOLIC PANEL
Anion gap: 10 (ref 5–15)
BUN: 38 mg/dL — ABNORMAL HIGH (ref 8–23)
CO2: 25 mmol/L (ref 22–32)
Calcium: 10.4 mg/dL — ABNORMAL HIGH (ref 8.9–10.3)
Chloride: 103 mmol/L (ref 98–111)
Creatinine, Ser: 2.21 mg/dL — ABNORMAL HIGH (ref 0.61–1.24)
GFR, Estimated: 33 mL/min — ABNORMAL LOW (ref >60)
Glucose, Bld: 81 mg/dL (ref 70–99)
Potassium: 3.5 mmol/L (ref 3.5–5.1)
Sodium: 138 mmol/L (ref 135–145)

## 2022-04-30 LAB — URINALYSIS, ROUTINE W REFLEX MICROSCOPIC
Bilirubin Urine: NEGATIVE
Glucose, UA: NEGATIVE mg/dL
Ketones, ur: NEGATIVE mg/dL
Nitrite: NEGATIVE
Protein, ur: 100 mg/dL — AB
RBC / HPF: >50 RBC/hpf — ABNORMAL HIGH (ref 0–5)
Specific Gravity, Urine: 1.01 (ref 1.005–1.030)
pH: 6 (ref 5.0–8.0)

## 2022-04-30 LAB — CBC
HCT: 39.3 % (ref 39.0–52.0)
Hemoglobin: 12.7 g/dL — ABNORMAL LOW (ref 13.0–17.0)
MCH: 28.2 pg (ref 26.0–34.0)
MCHC: 32.3 g/dL (ref 30.0–36.0)
MCV: 87.1 fL (ref 80.0–100.0)
Platelets: 247 10*3/uL (ref 150–400)
RBC: 4.51 MIL/uL (ref 4.22–5.81)
RDW: 14.2 % (ref 11.5–15.5)
WBC: 8.4 10*3/uL (ref 4.0–10.5)
nRBC: 0 % (ref 0.0–0.2)

## 2022-04-30 LAB — LACTIC ACID, PLASMA: Lactic Acid, Venous: 1.1 mmol/L (ref 0.5–1.9)

## 2022-04-30 MED ORDER — LACTATED RINGERS IV BOLUS
1000.0000 mL | Freq: Once | INTRAVENOUS | Status: AC
  Administered 2022-04-30: 1000 mL via INTRAVENOUS

## 2022-04-30 MED ORDER — MYCOPHENOLATE MOFETIL 250 MG PO CAPS
1000.0000 mg | ORAL_CAPSULE | Freq: Two times a day (BID) | ORAL | Status: DC
  Administered 2022-05-01 – 2022-05-03 (×5): 1000 mg via ORAL
  Filled 2022-04-30 (×6): qty 4

## 2022-04-30 MED ORDER — SODIUM CHLORIDE 0.9 % IV SOLN
1.0000 g | Freq: Two times a day (BID) | INTRAVENOUS | Status: DC
  Administered 2022-04-30 – 2022-05-03 (×6): 1 g via INTRAVENOUS
  Filled 2022-04-30 (×6): qty 20

## 2022-04-30 MED ORDER — MYCOPHENOLATE MOFETIL 250 MG PO CAPS: 1000.0000 mg | ORAL_CAPSULE | Freq: Two times a day (BID) | ORAL | Status: DC

## 2022-04-30 NOTE — Assessment & Plan Note (Signed)
Stable

## 2022-04-30 NOTE — Assessment & Plan Note (Addendum)
Chronic. bmi 36.9

## 2022-04-30 NOTE — Progress Notes (Signed)
Pharmacy Antibiotic Note  Robert [name] is a 61 y.o. male for which pharmacy has been consulted for meropenem dosing for  hx of ESBL infection .  Patient with a history of HTN, SVT, CVA, A fib, OSA, CKD, kidney transplant, and T2DM. Patient presenting with dizziness, flank pain, hematuria.  SCr 2.21 - enar baseline WBC 8.4; afebrile; HR 68; RR 18  11/08/21 UCx w/ Klebsiella pneumoniae (AMP/SUL S; Cefepime S; Gent S; Imipenem S; Zosyn S; Bactrim S)  Plan: Meropenem 1g q12h Trend WBC, Fever, Renal function, & Clinical course F/u cultures, clinical course, WBC, fever De-escalate when able  Height: '5\' 11"'$  (180.3 cm) Weight: 120 kg (264 lb 8.8 oz) IBW/kg (Calculated) : 75.3  Temp (24hrs), Avg:98.6 F (37 C), Min:98.6 F (37 C), Max:98.6 F (37 C)  Recent Labs  Lab 04/30/22 1414  WBC 8.4  CREATININE 2.21*    Estimated Creatinine Clearance: 45.7 mL/min (A) (by C-G formula based on SCr of 2.21 mg/dL (H)).    No Known Allergies  Antimicrobials this admission: meropenem 7/24 >>  Microbiology results: Pending  Thank you for allowing pharmacy to be a part of this patient's care.  Lorelei Pont, PharmD, BCPS 04/30/2022 10:46 PM ED Clinical Pharmacist -  [phone]

## 2022-04-30 NOTE — Assessment & Plan Note (Signed)
Continue coreg and hydralazine.

## 2022-04-30 NOTE — Assessment & Plan Note (Signed)
Chronic Stable 

## 2022-04-30 NOTE — Assessment & Plan Note (Signed)
Last hospital admission he had esbl klebsiella.

## 2022-04-30 NOTE — Telephone Encounter (Signed)
Spoke to patient stated he wanted Dr.Kelly to know he will be going to [facility] ED today.Stated he has been urinating bright red blood all morning.I will make Dr.Kelly aware.

## 2022-04-30 NOTE — ED Provider Triage Note (Signed)
Emergency Medicine Provider Triage Evaluation Note  Robert [name] , a 61 y.o. male  was evaluated in triage.  Pt complains of hematuria.  Patient has a history of renal transplant he is followed at [facility].  Yesterday he had orange urine that became pink and then this morning had gross hematuria.  Last urine clear.  Talk to his renal transplant team who recommended he come here and have complete work-up including urinalysis with culture.  He has no flank pain or dysuria..  Review of Systems  Positive: Gross hematuria Negative: Fever chills  Physical Exam  BP 140/81 (BP Location: Left Arm)   Pulse 64   Temp 98.6 F (37 C) (Oral)   Resp 16   Ht '5\' 11"'$  (1.803 m)   Wt 120 kg   SpO2 96%   BMI 36.90 kg/m  Gen:   Awake, no distress   Resp:  Normal effort  MSK:   Moves extremities without difficulty  Other:  No CVA tenderness  Medical Decision Making  Medically screening exam initiated at 2:18 PM.  Appropriate orders placed.  Robert [name] was informed that the remainder of the evaluation will be completed by another provider, this initial triage assessment does not replace that evaluation, and the importance of remaining in the ED until their evaluation is complete.  Work-up initiated   Margarita Mail, PA-C 04/30/22 1419

## 2022-04-30 NOTE — Assessment & Plan Note (Signed)
Add SSI. Change to lantus during hospital stay. Is on u-500 insulin at home.

## 2022-04-30 NOTE — Assessment & Plan Note (Signed)
Chronic. Pt state he cut his 1st toe on right foot yesterday. Will have wound care address this and proper wound dressing. Does not appear infected at this time. POA.

## 2022-04-30 NOTE — ED Triage Notes (Signed)
Pt arrives POV for eval of R sided flank pain x 1 week. Dull in nature, reports hematuria x 24 hx. Reports crimson red urine this AM. Hx of R sided renal transplant. States has had straw yellow urine since that time. Reports also nicked the R great toe and has hx of neuropathy.

## 2022-04-30 NOTE — Assessment & Plan Note (Signed)
Continue CPAP.  

## 2022-04-30 NOTE — ED Provider Notes (Signed)
[facility] EMERGENCY DEPARTMENT Provider Note   CSN: [id number] Arrival date & time: 04/30/22  1150     History  Chief Complaint  Patient presents with   Dizziness   Flank Pain   Hematuria    Robert [name] is a 61 y.o. male.  Patient with a history of renal transplant 2018, previous stroke, previous CABG, Eliquis use with aortic valve replacement presenting with right-sided flank pain and lower abdominal pain for the past 1 week.  He noticed some hematuria this morning noting orange-colored urine yesterday, pink-colored urine today and then red-colored urine but now the urine is clear yellow.  Does have pain with urination, urgency and frequency.  Denies any fevers, chills, nausea or vomiting.  No chest pain or shortness of breath.  Does feel "dizzy" which is a ongoing issue for him but became more severe after he saw the blood in the urine.  Describes the dizziness as room spinning and lightheadedness.  His wife states he is anxious and becomes dizzy when he is anxious.  Has had previous strokes in the past during his CABG surgery in December.  Denies any new focal weakness, numbness or tingling.  No chest pain or shortness of breath.   Dizziness Associated symptoms: no nausea, no shortness of breath and no vomiting   Flank Pain Associated symptoms include abdominal pain. Pertinent negatives include no shortness of breath.  Hematuria Associated symptoms include abdominal pain. Pertinent negatives include no shortness of breath.       Home Medications Prior to Admission medications   Medication Sig Start Date End Date Taking? Authorizing Provider  acetaminophen (TYLENOL) 325 MG tablet Take 2 tablets (650 mg total) by mouth every 4 (four) hours as needed for moderate pain. 10/15/21   Nani Skillern, PA-C  albuterol (VENTOLIN HFA) 108 (90 Base) MCG/ACT inhaler SMARTSIG:1 Puff(s) Via Inhaler Every 4 Hours PRN Patient not taking: Reported on 04/20/2022  03/18/22   [provider]  amLODipine (NORVASC) 5 MG tablet Take 1 tablet (5 mg total) by mouth daily. 03/12/22 03/07/23  Troy Sine, MD  apixaban (ELIQUIS) 5 MG TABS tablet Take 1 tablet (5 mg total) by mouth 2 (two) times daily. Stop coumadin, monitor the PT/INR, and start Eliquis when the INR is <2. 01/31/22   Warren Lacy, PA-C  belatacept (NULOJIX) 250 MG SOLR injection Inject 24.5 mLs into the vein every 28 (twenty-eight) days.  08/30/17   [provider]  carvedilol (COREG) 25 MG tablet Take 1 tablet (25 mg total) by mouth 2 (two) times daily with a meal. 10/26/21   Dahal, Marlowe Aschoff, MD  chlorthalidone (HYGROTON) 25 MG tablet Take 1 tablet by mouth daily. 04/06/22 04/06/23  [provider]  cloNIDine (CATAPRES) 0.1 MG tablet Take 0.1 mg by mouth every 8 (eight) hours as needed (SBP >160). Patient not taking: Reported on 04/20/2022    [provider]  Continuous Blood Gluc Sensor (FREESTYLE LIBRE 3 SENSOR) MISC SMARTSIG:1 Topical Every 2 Weeks 11/21/21   [provider]  doxazosin (CARDURA) 2 MG tablet Take 0.5 tablets (1 mg total) by mouth daily. Take 0.5 Tablet Daily Patient not taking: Reported on 04/20/2022 02/07/22   Warren Lacy, PA-C  DULoxetine (CYMBALTA) 60 MG capsule Take 60 mg by mouth daily. Patient not taking: Reported on 04/20/2022 07/19/20   [provider]  furosemide (LASIX) 20 MG tablet Take 20 mg by mouth as needed. Patient not taking: Reported on 04/20/2022    [provider]  hydrALAZINE (APRESOLINE) 100 MG tablet TAKE 1 TABLET BY MOUTH EVERY 8 HOURS 04/06/22   Croitoru, Mihai, MD  insulin aspart (NOVOLOG) 100 UNIT/ML injection Inject into the skin 3 (three) times daily before meals.    [provider]  Insulin Pen Needle (B-D UF III MINI PEN NEEDLES) 31G X 5 MM MISC Use to inject insulin 4 times a day 03/02/22   Elayne Snare, MD  insulin regular human CONCENTRATED (HUMULIN R) 500 UNIT/ML injection  Inject 125 Units into the skin every 3 (three) days.    [provider]  levothyroxine (SYNTHROID) 112 MCG tablet Take 1 tablet (112 mcg total) by mouth daily. 01/09/22   Elayne Snare, MD  mycophenolate (CELLCEPT) 250 MG capsule Take 1,000 mg by mouth every 12 (twelve) hours.     [provider]  NEEDLE, DISP, 18 G (BD HYPODERMIC NEEDLE) 18G X 1" MISC Inject 2 each into the muscle every 7 (seven) days. Use 2 needles once weekly for Testosterone medication. Use 1 needle to draw up medication and 2'nd needle to inject into muscle. 04/20/22   Elayne Snare, MD  NEEDLE, DISP, 23 G (BD DISP NEEDLE) 23G X 1" MISC USE TO INJECT TESTOSTERONE MEDICATION 04/20/22   Elayne Snare, MD  nitroGLYCERIN (NITROSTAT) 0.3 MG SL tablet Place under the tongue.    [provider]  NON FORMULARY CPAP/BIPAP  at bedtime  BIPAP per pt    [provider]  predniSONE (DELTASONE) 5 MG tablet Take 5 mg by mouth daily with breakfast. Continuously    [provider]  rosuvastatin (CRESTOR) 20 MG tablet Take 1 tablet (20 mg total) by mouth daily. 02/09/22   Elayne Snare, MD  senna-docusate (SENOKOT-S) 8.6-50 MG tablet Take 1 tablet by mouth at bedtime as needed for mild constipation. 10/26/21   Terrilee Croak, MD  Syringe, Disposable, 1 ML MISC Use to draw up 0.81m of testosterone medication 04/20/22   KElayne Snare MD  Testosterone Cypionate 200 MG/ML SOLN Inject as directed.    [provider]  traMADol (ULTRAM) 50 MG tablet Take 50 mg by mouth every 4 (four) hours as needed. 03/11/22   [provider]  traZODone (DESYREL) 50 MG tablet Take 50 mg by mouth See admin instructions. As needed at bedtime for insomnia x 14 days    [provider]  zolpidem (AMBIEN) 5 MG tablet Take 5 mg by mouth at bedtime as needed for sleep.    [provider]      Allergies    Patient has no known allergies.    Review of Systems   Review of Systems  Constitutional:  Negative for  activity change, appetite change and fever.  Respiratory:  Negative for cough, chest tightness and shortness of breath.   Gastrointestinal:  Positive for abdominal pain. Negative for nausea and vomiting.  Genitourinary:  Positive for dysuria, flank pain, hematuria and urgency.  Musculoskeletal:  Positive for back pain. Negative for arthralgias and myalgias.  Skin:  Positive for rash.  Neurological:  Positive for dizziness.   all other systems are negative except as noted in the HPI and PMH.    Physical Exam Updated Vital Signs BP 140/68   Pulse 68   Temp 98.6 F (37 C)   Resp 14   Ht '5\' 11"'$  (1.803 m)   Wt 120 kg   SpO2 99%   BMI 36.90 kg/m  Physical Exam Vitals and nursing note reviewed.  Constitutional:  General: He is not in acute distress.    Appearance: He is well-developed.  HENT:     Head: Normocephalic and atraumatic.     Mouth/Throat:     Pharynx: No oropharyngeal exudate.  Eyes:     Conjunctiva/sclera: Conjunctivae normal.     Pupils: Pupils are equal, round, and reactive to light.  Neck:     Comments: No meningismus. Cardiovascular:     Rate and Rhythm: Normal rate and regular rhythm.     Heart sounds: Normal heart sounds. No murmur heard. Pulmonary:     Effort: Pulmonary effort is normal. No respiratory distress.     Breath sounds: Normal breath sounds.  Abdominal:     Palpations: Abdomen is soft.     Tenderness: There is abdominal tenderness. There is no guarding or rebound.     Comments: Right lower abdominal tenderness, no guarding or rebound  Musculoskeletal:        General: No tenderness. Normal range of motion.     Cervical back: Normal range of motion and neck supple.     Comments: No CVAT  Skin:    General: Skin is warm.  Neurological:     Mental Status: He is alert and oriented to person, place, and time.     Cranial Nerves: No cranial nerve deficit.     Motor: No abnormal muscle tone.     Coordination: Coordination normal.      Comments:  5/5 strength throughout. CN 2-12 intact.Equal grip strength.   Psychiatric:        Behavior: Behavior normal.     ED Results / Procedures / Treatments   Labs (all labs ordered are listed, but only abnormal results are displayed) Labs Reviewed  URINALYSIS, ROUTINE W REFLEX MICROSCOPIC - Abnormal; Notable for the following components:      Result Value   APPearance HAZY (*)    Hgb urine dipstick MODERATE (*)    Protein, ur 100 (*)    Leukocytes,Ua SMALL (*)    RBC / HPF >50 (*)    Bacteria, UA MANY (*)    All other components within normal limits  BASIC METABOLIC PANEL - Abnormal; Notable for the following components:   BUN 38 (*)    Creatinine, Ser 2.21 (*)    Calcium 10.4 (*)    GFR, Estimated 33 (*)    All other components within normal limits  CBC - Abnormal; Notable for the following components:   Hemoglobin 12.7 (*)    All other components within normal limits  URINE CULTURE  CULTURE, BLOOD (ROUTINE X 2)  CULTURE, BLOOD (ROUTINE X 2)  LACTIC ACID, PLASMA  LACTIC ACID, PLASMA    EKG EKG Interpretation  Date/Time:  Monday April 30 2022 22:32:28 EDT Ventricular Rate:  68 PR Interval:  171 QRS Duration: 105 QT Interval:  444 QTC Calculation: 473 R Axis:   22 Text Interpretation: Sinus rhythm Probable anteroseptal infarct, old When compared with ECG of EARLIER SAME DATE No significant change was found Confirmed by Delora Fuel ([id number]) on 04/30/2022 11:25:32 PM  Radiology CT Renal Stone Study  Result Date: 04/30/2022 CLINICAL DATA:  Flank pain, hematuria EXAM: CT ABDOMEN AND PELVIS WITHOUT CONTRAST TECHNIQUE: Multidetector CT imaging of the abdomen and pelvis was performed following the standard protocol without IV contrast. RADIATION DOSE REDUCTION: This exam was performed according to the departmental dose-optimization program which includes automated exposure control, adjustment of the mA and/or kV according to patient size and/or use of iterative  reconstruction  technique. COMPARISON:  11/09/2021 FINDINGS: Lower chest: Lung bases are clear. Hepatobiliary: Unenhanced liver is unremarkable. Layering gallbladder sludge. No intrahepatic or extrahepatic duct dilatation. Pancreas: Within normal limits. Spleen: Within normal limits. Adrenals/Urinary Tract: Adrenal glands are within normal limits. Severe atrophy of the native left kidney. Mild atrophy of the native right kidney. No renal calculi or hydronephrosis. Right lower quadrant renal transplant with gas in right renal collecting system. No hydronephrosis. Nondependent gas in the bladder. Stomach/Bowel: Stomach is within normal limits. No evidence of bowel obstruction. Normal appendix (series 3/image 54). Mild left colonic diverticulosis, without evidence of diverticulitis. Vascular/Lymphatic: No evidence of abdominal aortic aneurysm. Atherosclerotic calcifications of the abdominal aorta and branch vessels. No suspicious abdominopelvic lymphadenopathy. Reproductive: Prostate is unremarkable. Other: Small fat containing left inguinal hernia (series 3/image 85). Musculoskeletal: Degenerative changes of the visualized thoracolumbar spine. IMPRESSION: Right lower quadrant renal transplant with gas in the right renal collecting system and bladder. Correlate for recent intervention/catheterization. Otherwise, this appearance suggests cystitis with ascending infection. No renal, ureteral, or bladder calculi. No hydronephrosis. Additional ancillary findings as above. Electronically Signed   By: Julian Hy M.D.   On: 04/30/2022 20:02    Procedures Procedures    Medications Ordered in ED Medications  lactated ringers bolus 1,000 mL (has no administration in time range)    ED Course/ Medical Decision Making/ A&P                           Medical Decision Making Amount and/or Complexity of Data Reviewed Labs: ordered. Decision-making details documented in ED Course. Radiology: ordered and  independent interpretation performed. Decision-making details documented in ED Course. ECG/medicine tests: ordered and independent interpretation performed. Decision-making details documented in ED Course.  Risk Decision regarding hospitalization.  Renal transplant patient with hematuria, urgency, frequency and dysuria.  No fever.  Vital stable, no distress.  Urinalysis concerning for infection.  Creatinine appears to be near baseline.  History of ESBL Klebsiella in February.  CT scan today shows no kidney stone but does show gas within the transplant kidney concerning for infection.  Patient with no history of recent manipulation  Patient started on meropenem given his history of ESBL Klebsiella.  Vitals are stable with no fever or evidence of sepsis.  Lactate and blood cultures are pending.  Recurrent dizziness which is acute and chronic problem for him.  Will obtain CT head imaging.  Low suspicion for acute CVA however.  Admission for IV antibiotics and hydration discussed with Dr. Bridgett Larsson.       Final Clinical Impression(s) / ED Diagnoses Final diagnoses:  Complicated UTI (urinary tract infection)    Rx / DC Orders ED Discharge Orders     None         Tanyla Stege, Annie Main, MD 04/30/22 2355

## 2022-04-30 NOTE — Telephone Encounter (Signed)
Patient is going to the ED due to patient urinating blood. Patient's BP is 146/74. [facility] told him to go to the hospital.

## 2022-04-30 NOTE — Subjective & Objective (Signed)
CC: hematuria, right flank pain, dysuria HPI:  61 year old white male history of hypertension, history of renal transplant, history of aortic valve replacement, paroxysmal A-fib, type 2 diabetes, hypertension, morbid obesity, CKD stage IIIb, presents to the hospital with 24-hour history of increasing right flank pain, dysuria, hematuria.  Patient states that yesterday he developed a low blood cloudy urine.  Overnight he had developed gross hematuria along with flank pain.  Denies any fever or chills.  No nausea no vomiting.  He has been having progressive pain in his right side.  He was admitted to the hospital in early 2023 with a ESBL Klebsiella pneumonia UTI.  No chest pain.  Nausea, vomiting.  On arrival temp 90.6 heart rate 64 blood pressure 140/81.  Lactic acid normal is 1.1.  Urinalysis showed small leukocyte esterase, greater than 50 RBCs, 21-50 WBCs, bacteria many.  Chemistry showed BUN of 38, creatinine 2.2  White count 8.4  CT urogram showed gas in the right renal collecting system and bladder.  [facility] hospitalist contacted for admission.

## 2022-04-30 NOTE — Assessment & Plan Note (Signed)
Admit to med/surg bed. IV meropenem given last urine cx in 11-2021 was ESBL Klebsiella. Will hold eliquis for 24 hours given gross hematuria today. So signs of sepsis at this time.

## 2022-04-30 NOTE — Assessment & Plan Note (Signed)
Stable Scr.

## 2022-04-30 NOTE — H&P (Signed)
History and Physical    Robert [name] NWG:[id number] DOB: 1961/04/10 DOA: 04/30/2022  DOS: the patient was seen and examined on 04/30/2022  PCP: Haywood Pao, MD   Patient coming from: Home  I have personally briefly reviewed patient's old medical records in [place]  CC: hematuria, right flank pain, dysuria HPI:  61 year old white male history of hypertension, history of renal transplant, history of aortic valve replacement, paroxysmal A-fib, type 2 diabetes, hypertension, morbid obesity, CKD stage IIIb, presents to the hospital with 24-hour history of increasing right flank pain, dysuria, hematuria.  Patient states that yesterday he developed a low blood cloudy urine.  Overnight he had developed gross hematuria along with flank pain.  Denies any fever or chills.  No nausea no vomiting.  He has been having progressive pain in his right side.  He was admitted to the hospital in early 2023 with a ESBL Klebsiella pneumonia UTI.  No chest pain.  Nausea, vomiting.  On arrival temp 90.6 heart rate 64 blood pressure 140/81.  Lactic acid normal is 1.1.  Urinalysis showed small leukocyte esterase, greater than 50 RBCs, 21-50 WBCs, bacteria many.  Chemistry showed BUN of 38, creatinine 2.2  White count 8.4  CT urogram showed gas in the right renal collecting system and bladder.  [facility] hospital[facility] contacted for admission.   ED Course: CT urogram showed some gas in the right renal collecting system along with bladder.  UA shows pyuria and bacteria.  Normal white count.  Review of Systems:  Review of Systems  Constitutional: Negative.   HENT: Negative.    Eyes: Negative.   Respiratory: Negative.    Cardiovascular: Negative.   Gastrointestinal: Negative.   Genitourinary:  Positive for dysuria, flank pain and hematuria.  Skin: Negative.   Neurological: Negative.   Endo/Heme/Allergies: Negative.   Psychiatric/Behavioral: Negative.    All other systems reviewed and are  negative.   Past Medical History:  Diagnosis Date   Acute cystitis without hematuria 11/09/2021   AKI (acute kidney injury) 08/08/2017   Elevated sCr to 2.5 (baseline ~1.7) noted at OSH - Subsequent transfer to [facility]  - Currently on IVMF in the setting of diarrhea and AKI - sCr at baseline at time of discharge   Anemia in chronic kidney disease 03/30/2015   Anticoagulated on Coumadin 11/09/2021   Aortic valve stenosis    Arthritis    Atrial fibrillation 11/29/2021   Benign essential hypertension 07/30/2014   prior to transplant pt took Metorpolol tartrate '50mg'$  qd on off HD days   Benign neoplasm of transverse colon    Cancer    skin ca right shoulder, plastic dsyplasia, pre-Ca polpys removed on Colonoscopy- 07/2014   Cerebrovascular accident (CVA) due to embolism of precerebral artery    Chronic renal failure    Colon polyps 07/23/2014   Tubular adenomas x 5   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   Diverticulosis of colon without hemorrhage    Eczema    ESRD (end stage renal disease)    hemodialysis 06/2014-02/2017, s/p living unrelated kidney transplant 02/26/17   Exertional dyspnea    Fatigue 01/08/2014   Fissure of skin 03/08/2020   GI bleed 07/26/2017   Headache    Heart murmur    aortic stenosis (moderate-severe  04/2021)   History of COVID-19 2023   History of unilateral nephrectomy 09/06/2013   Hydrocele, acquired 04/07/2017   Hyperlipidemia 09/06/2013   Hypertensive urgency 10/21/2021   Hyponatremia 11/09/2021   Hypothyroidism    Immunosuppressed status 02/26/2017   Induction agent: Solumedrol - Envarsus '3mg'$  daily  - Home dose decreased from '5mg'$  to '3mg'$  in the setting of FK 15.7 while inpatient   - Previously on Prograf, though d/c'ed during  previous admission; suspected this may be causing hallucinations  - Cellcept '1000mg'$  Q12H  - Prednisone '5mg'$  daily   Inguinal hernia without obstruction or gangrene 06/30/2017   Right inguinal wall defect appreciated by CT imaging [place] and on exam Dr. Paulla Fore today Dr. Paulla Fore has discussed hernia repair with mesh at some point in near future  Last Assessment & Plan:  Formatting of this note might be different from the original. Right inguinal wall defect appreciated by CT imaging [place] and on exam Dr. Paulla Fore today Dr. Paulla Fore has discussed hern   Kidney transplanted 02/26/2017   Date of Transplant = 02/26/17  ABO (Donor/Recipient) = compatible Blood type: O+/A+ DonorType: Living donor, unrelated Allograft type:Left kidney Donor anatomy: 2 arteries, 1 veins and single ureter  Pants-to-skirt anastomosis of dual renal arteries on the backtable. Donor Kidney Bx: Not applicable Allograft injury/complications: None Ne   Lower extremity edema 09/06/2013   Macular degeneration    Mild vascular neurocognitive disorder 02/08/2022   OAB (overactive bladder) 12/28/2019   Obstructive sleep apnea 09/06/2013   On Bi-PAP  Last Assessment & Plan:  Formatting of this note might be different from the original. - History of OSA, continue CPAP at night   Peritoneal dialysis catheter in place 02/24/2015   RLQ abdominal pain 04/19/2020   S/P AVR (aortic valve replacement) 10/05/2021   SBO (small bowel obstruction) 05/2015   Scrotal edema 03/14/2017   Testicular ultrasound did show hydrocele  Will refer to urology regarding ongoign swelling, hydrocele.   Last Assessment & Plan:  Formatting of this note might be different from the original.   Testicular ultrasound did show hydrocele  Will refer to urology regarding ongoign swelling, hydrocele.   Shortness of breath    with exertion   Stage 3b chronic kidney disease (CKD) 03/22/2014   SVT (supraventricular tachycardia) 09/06/2013   Tremor of both hands 06/29/2017    Last Assessment & Plan:  Present since transplant, stable and unchanged Query from tacrolimus -Improved tremors noted and verbalized by pt - Prograf doses titrated per FK levels  Last Assessment & Plan:  Formatting of this note might be different from the original. Present since transplant, stable and unchanged Query from tacrolimus -Improved tremors noted and verbalized by pt - Prograf doses titr   Type 2 diabetes mellitus with hyperglycemia, with long-term current use of insulin 20/25/4270   Umbilical hernia    s/p repair 04/28/15    Past Surgical History:  Procedure Laterality Date   AORTIC VALVE REPLACEMENT N/A 10/05/2021   Procedure: AORTIC VALVE REPLACEMENT (AVR) WITH INSPIRIS RESILIA AORTIC VALVE SIZE 27MM;  Surgeon: [name], MD;  Location: [place];  Service: Open Heart Surgery;  Laterality: N/A;   AV FISTULA PLACEMENT Right 02/25/2015   Procedure: RIGHT ARTERIOVENOUS (AV) FISTULA CREATION;  Surgeon: Serafina Mitchell, MD;  Location: [place];  Service: Vascular;  Laterality: Right;   AV FISTULA PLACEMENT Right 09/15/2015   Procedure: ARTERIOVENOUS (AV) FISTULA CREATION- RIGHT ARM;  Surgeon: Serafina Mitchell, MD;  Location: [place];  Service: Vascular;  Laterality: Right;   CAPD INSERTION N/A 05/18/2014   Procedure: LAPAROSCOPIC INSERTION CONTINUOUS AMBULATORY PERITONEAL DIALYSIS  (CAPD) CATHETER;  Surgeon: Ralene Ok, MD;  Location: [place];  Service: General;  Laterality: N/A;   COLONOSCOPY     COLONOSCOPY WITH PROPOFOL N/A 07/12/2016   Procedure: COLONOSCOPY WITH PROPOFOL;  Surgeon: [name], MD;  Location: [facility];  Service: Endoscopy;  Laterality: N/A;   CORONARY ARTERY BYPASS GRAFT N/A 10/05/2021   Procedure: CORONARY ARTERY BYPASS GRAFTING (CABG) X 1, ON PUMP, USING LEFT INTERNAL MAMMARY ARTERY CONDUIT;  Surgeon: [name], MD;  Location: [place];  Service: Open Heart Surgery;  Laterality: N/A;   declotting of fistula  08/2015   ESOPHAGOGASTRODUODENOSCOPY (EGD) WITH  PROPOFOL N/A 07/12/2016   Procedure: ESOPHAGOGASTRODUODENOSCOPY (EGD) WITH PROPOFOL;  Surgeon: [name], MD;  Location: [facility];  Service: Endoscopy;  Laterality: N/A;   FISTULA SUPERFICIALIZATION Right 02/07/2016   Procedure: RIIGHT UPPER ARM FISTULA SUPERFICIALIZATION;  Surgeon: Elam Dutch, MD;  Location: [place];  Service: Vascular;  Laterality: Right;   IJ catheter insertion     INSERTION OF DIALYSIS CATHETER Right 02/25/2015   Procedure: INSERTION OF RIGHT INTERNAL JUGULAR DIALYSIS CATHETER;  Surgeon: Serafina Mitchell, MD;  Location: [place] [facility];  Service: Vascular;  Laterality: Right;   KIDNEY TRANSPLANT     LAPAROSCOPIC REPOSITIONING CAPD CATHETER N/A 06/16/2014   Procedure: LAPAROSCOPIC REPOSITIONING CAPD CATHETER;  Surgeon: Ralene Ok, MD;  Location: [place];  Service: General;  Laterality: N/A;   LAPAROSCOPY N/A 05/04/2015   Procedure: LAPAROSCOPY DIAGNOSTIC LYSIS OF ADHESIONS;  Surgeon: Coralie Keens, MD;  Location: [place];  Service: General;  Laterality: N/A;   MINOR REMOVAL OF PERITONEAL DIALYSIS CATHETER N/A 04/28/2015   Procedure:  REMOVAL OF PERITONEAL DIALYSIS CATHETER;  Surgeon: Coralie Keens, MD;  Location: [place];  Service: General;  Laterality: N/A;   NEPHRECTOMY Left 1974   RENAL BIOPSY Right 2012   REVISON OF ARTERIOVENOUS FISTULA Right 05/26/2015   Procedure: SUPERFICIALIZATION OF ARTERIOVENOUS FISTULA WITH SIDE BRANCH LIGATIONS;  Surgeon: Serafina Mitchell, MD;  Location: [facility];  Service: Vascular;  Laterality: Right;   RIGHT/LEFT HEART CATH AND CORONARY ANGIOGRAPHY N/A 08/22/2021   Procedure: RIGHT/LEFT HEART CATH AND CORONARY ANGIOGRAPHY;  Surgeon: Troy Sine, MD;  Location: [place] [facility];  Service: Cardiovascular;  Laterality: N/A;   SKIN CANCER EXCISION     right shoulder   SVT ABLATION N/A 11/23/2016   Procedure: SVT Ablation;  Surgeon: Will Meredith Leeds, MD;  Location: [place] [facility];  Service: Cardiovascular;  Laterality: N/A;   TEE WITHOUT  CARDIOVERSION N/A 10/05/2021   Procedure: TRANSESOPHAGEAL ECHOCARDIOGRAM (TEE);  Surgeon: [name], MD;  Location: [place];  Service: Open Heart Surgery;  Laterality: N/A;   UMBILICAL HERNIA REPAIR N/A 05/18/2014   Procedure: HERNIA REPAIR UMBILICAL ADULT;  Surgeon: Ralene Ok, MD;  Location: [place];  Service: General;  Laterality: N/A;   UMBILICAL HERNIA REPAIR N/A 04/28/2015   Procedure: UMBILICAL HERNIA REPAIR WITH MESH;  Surgeon: Coralie Keens, MD;  Location: [place];  Service: General;  Laterality: N/A;     reports that he quit smoking about 9 years ago. His smoking use included cigars. He has never used smokeless tobacco. He reports that he does not currently use alcohol. He reports that he does not use drugs.  No Known Allergies  Family History  Problem Relation Age of Onset   Colon polyps Father  Stroke Father    Dementia Father        vascular   Colon cancer Neg Hx    Stomach cancer Neg Hx     Prior to Admission medications   Medication Sig Start Date End Date Taking? Authorizing Provider  acetaminophen (TYLENOL) 325 MG tablet Take 2 tablets (650 mg total) by mouth every 4 (four) hours as needed for moderate pain. 10/15/21   Nani Skillern, PA-C  albuterol (VENTOLIN HFA) 108 (90 Base) MCG/ACT inhaler SMARTSIG:1 Puff(s) Via Inhaler Every 4 Hours PRN Patient not taking: Reported on 04/20/2022 03/18/22   [provider]  amLODipine (NORVASC) 5 MG tablet Take 1 tablet (5 mg total) by mouth daily. 03/12/22 03/07/23  Troy Sine, MD  apixaban (ELIQUIS) 5 MG TABS tablet Take 1 tablet (5 mg total) by mouth 2 (two) times daily. Stop coumadin, monitor the PT/INR, and start Eliquis when the INR is <2. 01/31/22   Warren Lacy, PA-C  belatacept (NULOJIX) 250 MG SOLR injection Inject 24.5 mLs into the vein every 28 (twenty-eight) days.  08/30/17   [provider]  carvedilol (COREG) 25 MG tablet Take 1 tablet (25 mg total) by mouth 2 (two) times daily  with a meal. 10/26/21   Dahal, Marlowe Aschoff, MD  chlorthalidone (HYGROTON) 25 MG tablet Take 1 tablet by mouth daily. 04/06/22 04/06/23  [provider]  cloNIDine (CATAPRES) 0.1 MG tablet Take 0.1 mg by mouth every 8 (eight) hours as needed (SBP >160). Patient not taking: Reported on 04/20/2022    [provider]  Continuous Blood Gluc Sensor (FREESTYLE LIBRE 3 SENSOR) MISC SMARTSIG:1 Topical Every 2 Weeks 11/21/21   [provider]  doxazosin (CARDURA) 2 MG tablet Take 0.5 tablets (1 mg total) by mouth daily. Take 0.5 Tablet Daily Patient not taking: Reported on 04/20/2022 02/07/22   Warren Lacy, PA-C  DULoxetine (CYMBALTA) 60 MG capsule Take 60 mg by mouth daily. Patient not taking: Reported on 04/20/2022 07/19/20   [provider]  furosemide (LASIX) 20 MG tablet Take 20 mg by mouth as needed. Patient not taking: Reported on 04/20/2022    [provider]  hydrALAZINE (APRESOLINE) 100 MG tablet TAKE 1 TABLET BY MOUTH EVERY 8 HOURS 04/06/22   Croitoru, Mihai, MD  insulin aspart (NOVOLOG) 100 UNIT/ML injection Inject into the skin 3 (three) times daily before meals.    [provider]  Insulin Pen Needle (B-D UF III MINI PEN NEEDLES) 31G X 5 MM MISC Use to inject insulin 4 times a day 03/02/22   Elayne Snare, MD  insulin regular human CONCENTRATED (HUMULIN R) 500 UNIT/ML injection Inject 125 Units into the skin every 3 (three) days.    [provider]  levothyroxine (SYNTHROID) 112 MCG tablet Take 1 tablet (112 mcg total) by mouth daily. 01/09/22   Elayne Snare, MD  mycophenolate (CELLCEPT) 250 MG capsule Take 1,000 mg by mouth every 12 (twelve) hours.     [provider]  NEEDLE, DISP, 18 G (BD HYPODERMIC NEEDLE) 18G X 1" MISC Inject 2 each into the muscle every 7 (seven) days. Use 2 needles once weekly for Testosterone medication. Use 1 needle to draw up medication and 2'nd needle to inject into muscle. 04/20/22   Elayne Snare, MD  NEEDLE,  DISP, 23 G (BD DISP NEEDLE) 23G X 1" MISC USE TO INJECT TESTOSTERONE MEDICATION 04/20/22   Elayne Snare, MD  nitroGLYCERIN (NITROSTAT) 0.3 MG SL tablet Place under the tongue.    [provider]  NON FORMULARY CPAP/BIPAP  at bedtime  BIPAP per pt    [provider]  predniSONE (DELTASONE) 5 MG tablet Take 5 mg by mouth daily with breakfast. Continuously    [provider]  rosuvastatin (CRESTOR) 20 MG tablet Take 1 tablet (20 mg total) by mouth daily. 02/09/22   Elayne Snare, MD  senna-docusate (SENOKOT-S) 8.6-50 MG tablet Take 1 tablet by mouth at bedtime as needed for mild constipation. 10/26/21   Terrilee Croak, MD  Syringe, Disposable, 1 ML MISC Use to draw up 0.63m of testosterone medication 04/20/22   KElayne Snare MD  Testosterone Cypionate 200 MG/ML SOLN Inject as directed.    [provider]  traMADol (ULTRAM) 50 MG tablet Take 50 mg by mouth every 4 (four) hours as needed. 03/11/22   [provider]  traZODone (DESYREL) 50 MG tablet Take 50 mg by mouth See admin instructions. As needed at bedtime for insomnia x 14 days    [provider]  zolpidem (AMBIEN) 5 MG tablet Take 5 mg by mouth at bedtime as needed for sleep.    [provider]    Physical Exam: Vitals:   04/30/22 1814 04/30/22 2230 04/30/22 2235 04/30/22 2253  BP: 140/68  (!) 125/50   Pulse: 72 68 68   Resp: '17 14 12   '$ Temp:    97.8 F (36.6 C)  TempSrc:    Oral  SpO2: 97% 99% 100%   Weight:      Height:        Physical Exam Vitals and nursing note reviewed.  Constitutional:      General: He is not in acute distress.    Appearance: Normal appearance. He is obese. He is not ill-appearing, toxic-appearing or diaphoretic.  HENT:     Head: Normocephalic and atraumatic.     Nose: Nose normal. No rhinorrhea.  Eyes:     General: No scleral icterus. Cardiovascular:     Rate and Rhythm: Normal rate and regular rhythm.     Pulses: Normal pulses.     Heart sounds:  Murmur heard.  Pulmonary:     Effort: Pulmonary effort is normal. No respiratory distress.     Breath sounds: No wheezing or rales.  Abdominal:     General: Bowel sounds are normal. There is no distension.     Tenderness: There is no abdominal tenderness. There is no guarding or rebound.  Musculoskeletal:     Right lower leg: No edema.     Left lower leg: No edema.  Skin:    General: Skin is warm and dry.     Capillary Refill: Capillary refill takes less than 2 seconds.     Comments: Small blister and laceration to right 1st toe. No active bleeding.  Neurological:     Mental Status: He is alert and oriented to person, place, and time.      Labs on Admission: I have personally reviewed following labs and imaging studies  CBC: Recent Labs  Lab 04/30/22 1414  WBC 8.4  HGB 12.7*  HCT 39.3  MCV 87.1  PLT 2009  Basic Metabolic Panel: Recent Labs  Lab 04/30/22 1414  NA 138  K 3.5  CL 103  CO2 25  GLUCOSE 81  BUN 38*  CREATININE 2.21*  CALCIUM 10.4*   GFR: Estimated Creatinine Clearance: 45.7 mL/min (A) (by C-G formula based on SCr of 2.21 mg/dL (H)). Liver Function Tests: No results for input(s): "AST", "ALT", "ALKPHOS", "BILITOT", "PROT", "  ALBUMIN" in the last 168 hours. No results for input(s): "LIPASE", "AMYLASE" in the last 168 hours. No results for input(s): "AMMONIA" in the last 168 hours. Coagulation Profile: No results for input(s): "INR", "PROTIME" in the last 168 hours. Cardiac Enzymes: No results for input(s): "CKTOTAL", "CKMB", "CKMBINDEX", "TROPONINI", "TROPONINIHS" in the last 168 hours. BNP (last 3 results) No results for input(s): "PROBNP" in the last 8760 hours. HbA1C: No results for input(s): "HGBA1C" in the last 72 hours. CBG: No results for input(s): "GLUCAP" in the last 168 hours. Lipid Profile: No results for input(s): "CHOL", "HDL", "LDLCALC", "TRIG", "CHOLHDL", "LDLDIRECT" in the last 72 hours. Thyroid Function Tests: No results for  input(s): "TSH", "T4TOTAL", "FREET4", "T3FREE", "THYROIDAB" in the last 72 hours. Anemia Panel: No results for input(s): "VITAMINB12", "FOLATE", "FERRITIN", "TIBC", "IRON", "RETICCTPCT" in the last 72 hours. Urine analysis:    Component Value Date/Time   COLORURINE YELLOW 04/30/2022 1243   APPEARANCEUR HAZY (A) 04/30/2022 1243   LABSPEC 1.010 04/30/2022 1243   PHURINE 6.0 04/30/2022 1243   GLUCOSEU NEGATIVE 04/30/2022 1243   GLUCOSEU NEGATIVE 08/03/2019 1006   HGBUR MODERATE (A) 04/30/2022 1243   BILIRUBINUR NEGATIVE 04/30/2022 1243   KETONESUR NEGATIVE 04/30/2022 1243   PROTEINUR 100 (A) 04/30/2022 1243   UROBILINOGEN 0.2 08/03/2019 1006   NITRITE NEGATIVE 04/30/2022 1243   LEUKOCYTESUR SMALL (A) 04/30/2022 1243    Radiological Exams on Admission: I have personally reviewed images CT Renal Stone Study  Result Date: 04/30/2022 CLINICAL DATA:  Flank pain, hematuria EXAM: CT ABDOMEN AND PELVIS WITHOUT CONTRAST TECHNIQUE: Multidetector CT imaging of the abdomen and pelvis was performed following the standard protocol without IV contrast. RADIATION DOSE REDUCTION: This exam was performed according to the departmental dose-optimization program which includes automated exposure control, adjustment of the mA and/or kV according to patient size and/or use of iterative reconstruction technique. COMPARISON:  11/09/2021 FINDINGS: Lower chest: Lung bases are clear. Hepatobiliary: Unenhanced liver is unremarkable. Layering gallbladder sludge. No intrahepatic or extrahepatic duct dilatation. Pancreas: Within normal limits. Spleen: Within normal limits. Adrenals/Urinary Tract: Adrenal glands are within normal limits. Severe atrophy of the native left kidney. Mild atrophy of the native right kidney. No renal calculi or hydronephrosis. Right lower quadrant renal transplant with gas in right renal collecting system. No hydronephrosis. Nondependent gas in the bladder. Stomach/Bowel: Stomach is within normal  limits. No evidence of bowel obstruction. Normal appendix (series 3/image 54). Mild left colonic diverticulosis, without evidence of diverticulitis. Vascular/Lymphatic: No evidence of abdominal aortic aneurysm. Atherosclerotic calcifications of the abdominal aorta and branch vessels. No suspicious abdominopelvic lymphadenopathy. Reproductive: Prostate is unremarkable. Other: Small fat containing left inguinal hernia (series 3/image 85). Musculoskeletal: Degenerative changes of the visualized thoracolumbar spine. IMPRESSION: Right lower quadrant renal transplant with gas in the right renal collecting system and bladder. Correlate for recent intervention/catheterization. Otherwise, this appearance suggests cystitis with ascending infection. No renal, ureteral, or bladder calculi. No hydronephrosis. Additional ancillary findings as above. Electronically Signed   By: Julian Hy M.D.   On: 04/30/2022 20:02    EKG: My personal interpretation of EKG shows: NSR    Assessment/Plan Principal Problem:   Pyelonephritis Active Problems:   History of ESBL Klebsiella pneumoniae infection   Obstructive sleep apnea   Hypothyroidism   Stage 3b chronic kidney disease (CKD)   Type 2 diabetes mellitus with hyperglycemia, with long-term current use of insulin   Morbid obesity ([place])   Kidney transplanted   Diabetic neuropathy   Immunosuppressed status   S/P AVR (  aortic valve replacement)   Benign essential hypertension   Atrial fibrillation   DNR (do not resuscitate)/DNI(Do Not Intubate)    Assessment and Plan: * Pyelonephritis Admit to med/surg bed. IV meropenem given last urine cx in 11-2021 was ESBL Klebsiella. Will hold eliquis for 24 hours given gross hematuria today. So signs of sepsis at this time.  History of ESBL Klebsiella pneumoniae infection Last hospital admission he had esbl klebsiella.  Benign essential hypertension Continue coreg and hydralazine.  S/P AVR (aortic valve  replacement) Stable.  Immunosuppressed status Chronic. Stable.  Diabetic neuropathy Chronic. Pt state he cut his 1st toe on right foot yesterday. Will have wound care address this and proper wound dressing. Does not appear infected at this time. POA.  Kidney transplanted Chronic. Continue prednisone 5 mg and cellcept 1000 mg bid.  Morbid obesity ([place]) Chronic. bmi 36.9  Type 2 diabetes mellitus with hyperglycemia, with long-term current use of insulin Add SSI. Change to lantus during hospital stay. Is on u-500 insulin at home.  Stage 3b chronic kidney disease (CKD) Stable Scr.  Hypothyroidism Stable. Continue synthroid 112 mcg daily.  Obstructive sleep apnea Continue CPAP.  DNR (do not resuscitate)/DNI(Do Not Intubate) Verified with pt that he wants to be a DNR/DNI. He states his wife and his brother are both aware of his decision.  Atrial fibrillation Currently in NSR. On eliquis at home. Will hold eliquis for 24 hours given gross hematuria today. Continue coreg 25 mg bid.       DVT prophylaxis: Eliquis Code Status: DNR/DNI(Do NOT Intubate)verified with pt Family Communication: no family at bedside  Disposition Plan: return home  Consults called: none  Admission status: Inpatient, Med-Surg   Kristopher Oppenheim, DO [facility] 04/30/2022, 11:40 PM

## 2022-04-30 NOTE — Assessment & Plan Note (Signed)
Stable. Continue synthroid 112 mcg daily.

## 2022-04-30 NOTE — Assessment & Plan Note (Signed)
Currently in NSR. On eliquis at home. Will hold eliquis for 24 hours given gross hematuria today. Continue coreg 25 mg bid.

## 2022-04-30 NOTE — Assessment & Plan Note (Signed)
Chronic. Continue prednisone 5 mg and cellcept 1000 mg bid.

## 2022-04-30 NOTE — Assessment & Plan Note (Signed)
Verified with pt that he wants to be a DNR/DNI. He states his wife and his brother are both aware of his decision.

## 2022-05-01 ENCOUNTER — Inpatient Hospital Stay (HOSPITAL_COMMUNITY): Payer: 59

## 2022-05-01 ENCOUNTER — Other Ambulatory Visit: Payer: Self-pay

## 2022-05-01 DIAGNOSIS — R29898 Other symptoms and signs involving the musculoskeletal system: Secondary | ICD-10-CM

## 2022-05-01 DIAGNOSIS — Z794 Long term (current) use of insulin: Secondary | ICD-10-CM

## 2022-05-01 DIAGNOSIS — G459 Transient cerebral ischemic attack, unspecified: Secondary | ICD-10-CM

## 2022-05-01 DIAGNOSIS — I480 Paroxysmal atrial fibrillation: Secondary | ICD-10-CM | POA: Diagnosis not present

## 2022-05-01 DIAGNOSIS — E1165 Type 2 diabetes mellitus with hyperglycemia: Secondary | ICD-10-CM

## 2022-05-01 DIAGNOSIS — E1142 Type 2 diabetes mellitus with diabetic polyneuropathy: Secondary | ICD-10-CM | POA: Diagnosis not present

## 2022-05-01 DIAGNOSIS — N12 Tubulo-interstitial nephritis, not specified as acute or chronic: Secondary | ICD-10-CM | POA: Diagnosis not present

## 2022-05-01 LAB — CBC WITH DIFFERENTIAL/PLATELET
Abs Immature Granulocytes: 0.07 10*3/uL (ref 0.00–0.07)
Basophils Absolute: 0.1 10*3/uL (ref 0.0–0.1)
Basophils Relative: 1 %
Eosinophils Absolute: 0.6 10*3/uL — ABNORMAL HIGH (ref 0.0–0.5)
Eosinophils Relative: 8 %
HCT: 36.4 % — ABNORMAL LOW (ref 39.0–52.0)
Hemoglobin: 11.9 g/dL — ABNORMAL LOW (ref 13.0–17.0)
Immature Granulocytes: 1 %
Lymphocytes Relative: 14 %
Lymphs Abs: 1 10*3/uL (ref 0.7–4.0)
MCH: 28.1 pg (ref 26.0–34.0)
MCHC: 32.7 g/dL (ref 30.0–36.0)
MCV: 86.1 fL (ref 80.0–100.0)
Monocytes Absolute: 0.7 10*3/uL (ref 0.1–1.0)
Monocytes Relative: 10 %
Neutro Abs: 5 10*3/uL (ref 1.7–7.7)
Neutrophils Relative %: 66 %
Platelets: 222 10*3/uL (ref 150–400)
RBC: 4.23 MIL/uL (ref 4.22–5.81)
RDW: 14.4 % (ref 11.5–15.5)
WBC: 7.5 10*3/uL (ref 4.0–10.5)
nRBC: 0 % (ref 0.0–0.2)

## 2022-05-01 LAB — ECHOCARDIOGRAM COMPLETE
AR max vel: 2.6 cm2
AV Area VTI: 2.73 cm2
AV Area mean vel: 2.51 cm2
AV Mean grad: 11 mmHg
AV Peak grad: 22.2 mmHg
Ao pk vel: 2.36 m/s
Area-P 1/2: 2.97 cm2
Calc EF: 74.3 %
Height: 71 in
MV VTI: 2.26 cm2
S' Lateral: 3.2 cm
Single Plane A2C EF: 76.6 %
Single Plane A4C EF: 74.4 %
Weight: 4232.83 oz

## 2022-05-01 LAB — COMPREHENSIVE METABOLIC PANEL
ALT: 13 U/L (ref 0–44)
AST: 15 U/L (ref 15–41)
Albumin: 3.5 g/dL (ref 3.5–5.0)
Alkaline Phosphatase: 59 U/L (ref 38–126)
Anion gap: 9 (ref 5–15)
BUN: 41 mg/dL — ABNORMAL HIGH (ref 8–23)
CO2: 25 mmol/L (ref 22–32)
Calcium: 9.6 mg/dL (ref 8.9–10.3)
Chloride: 103 mmol/L (ref 98–111)
Creatinine, Ser: 2.16 mg/dL — ABNORMAL HIGH (ref 0.61–1.24)
GFR, Estimated: 34 mL/min — ABNORMAL LOW (ref >60)
Glucose, Bld: 119 mg/dL — ABNORMAL HIGH (ref 70–99)
Potassium: 3.4 mmol/L — ABNORMAL LOW (ref 3.5–5.1)
Sodium: 137 mmol/L (ref 135–145)
Total Bilirubin: 0.5 mg/dL (ref 0.3–1.2)
Total Protein: 5.8 g/dL — ABNORMAL LOW (ref 6.5–8.1)

## 2022-05-01 LAB — GLUCOSE, CAPILLARY
Glucose-Capillary: 82 mg/dL (ref 70–99)
Glucose-Capillary: 200 mg/dL — ABNORMAL HIGH (ref 70–99)
Glucose-Capillary: 301 mg/dL — ABNORMAL HIGH (ref 70–99)
Glucose-Capillary: 167 mg/dL — ABNORMAL HIGH (ref 70–99)
Glucose-Capillary: 51 mg/dL — ABNORMAL LOW (ref 70–99)
Glucose-Capillary: 67 mg/dL — ABNORMAL LOW (ref 70–99)
Glucose-Capillary: 68 mg/dL — ABNORMAL LOW (ref 70–99)
Glucose-Capillary: 74 mg/dL (ref 70–99)
Glucose-Capillary: 62 mg/dL — ABNORMAL LOW (ref 70–99)
Glucose-Capillary: 63 mg/dL — ABNORMAL LOW (ref 70–99)
Glucose-Capillary: 91 mg/dL (ref 70–99)

## 2022-05-01 LAB — MAGNESIUM: Magnesium: 2.3 mg/dL (ref 1.7–2.4)

## 2022-05-01 LAB — LACTIC ACID, PLASMA: Lactic Acid, Venous: 0.8 mmol/L (ref 0.5–1.9)

## 2022-05-01 MED ORDER — ROSUVASTATIN CALCIUM 20 MG PO TABS
20.0000 mg | ORAL_TABLET | Freq: Every day | ORAL | Status: DC
  Administered 2022-05-02 – 2022-05-03 (×2): 20 mg via ORAL
  Filled 2022-05-01 (×3): qty 1

## 2022-05-01 MED ORDER — INSULIN ASPART 100 UNIT/ML IJ SOLN: 0.0000 [IU] | Freq: Every day | INTRAMUSCULAR | Status: DC

## 2022-05-01 MED ORDER — SODIUM CHLORIDE 0.9 % IV SOLN: INTRAVENOUS | Status: DC

## 2022-05-01 MED ORDER — APIXABAN 5 MG PO TABS: 5.0000 mg | ORAL_TABLET | Freq: Two times a day (BID) | ORAL | Status: DC

## 2022-05-01 MED ORDER — POVIDONE-IODINE 10 % EX OINT
TOPICAL_OINTMENT | Freq: Two times a day (BID) | CUTANEOUS | Status: DC
  Filled 2022-05-01 (×2): qty 28.35

## 2022-05-01 MED ORDER — INSULIN ASPART 100 UNIT/ML IJ SOLN
0.0000 [IU] | Freq: Three times a day (TID) | INTRAMUSCULAR | Status: DC
  Administered 2022-05-01 (×2): 4 [IU] via SUBCUTANEOUS

## 2022-05-01 MED ORDER — ACETAMINOPHEN 650 MG RE SUPP: 650.0000 mg | Freq: Four times a day (QID) | RECTAL | Status: DC | PRN

## 2022-05-01 MED ORDER — CARVEDILOL 12.5 MG PO TABS
25.0000 mg | ORAL_TABLET | Freq: Two times a day (BID) | ORAL | Status: DC
  Administered 2022-05-01 – 2022-05-03 (×4): 25 mg via ORAL
  Filled 2022-05-01: qty 1
  Filled 2022-05-01 (×4): qty 2

## 2022-05-01 MED ORDER — ONDANSETRON HCL 4 MG/2ML IJ SOLN: 4.0000 mg | Freq: Four times a day (QID) | INTRAMUSCULAR | Status: DC | PRN

## 2022-05-01 MED ORDER — TRAMADOL HCL 50 MG PO TABS
50.0000 mg | ORAL_TABLET | ORAL | Status: DC | PRN
  Administered 2022-05-01 – 2022-05-03 (×4): 50 mg via ORAL
  Filled 2022-05-01 (×4): qty 1

## 2022-05-01 MED ORDER — ASPIRIN 300 MG RE SUPP: 300.0000 mg | Freq: Every day | RECTAL | Status: DC

## 2022-05-01 MED ORDER — AMLODIPINE BESYLATE 5 MG PO TABS
5.0000 mg | ORAL_TABLET | Freq: Every day | ORAL | Status: DC
  Filled 2022-05-01: qty 1

## 2022-05-01 MED ORDER — ASPIRIN 325 MG PO TABS
325.0000 mg | ORAL_TABLET | Freq: Every day | ORAL | Status: DC
  Administered 2022-05-01 – 2022-05-02 (×2): 325 mg via ORAL
  Filled 2022-05-01 (×2): qty 1

## 2022-05-01 MED ORDER — HYDRALAZINE HCL 50 MG PO TABS
100.0000 mg | ORAL_TABLET | Freq: Three times a day (TID) | ORAL | Status: DC
  Administered 2022-05-01: 100 mg via ORAL
  Filled 2022-05-01: qty 2

## 2022-05-01 MED ORDER — LEVOTHYROXINE SODIUM 112 MCG PO TABS
112.0000 ug | ORAL_TABLET | Freq: Every day | ORAL | Status: DC
  Administered 2022-05-01 – 2022-05-03 (×3): 112 ug via ORAL
  Filled 2022-05-01 (×3): qty 1

## 2022-05-01 MED ORDER — INSULIN GLARGINE-YFGN 100 UNIT/ML ~~LOC~~ SOLN
20.0000 [IU] | Freq: Every day | SUBCUTANEOUS | Status: DC
  Filled 2022-05-01: qty 0.2

## 2022-05-01 MED ORDER — ZOLPIDEM TARTRATE 5 MG PO TABS
5.0000 mg | ORAL_TABLET | Freq: Every evening | ORAL | Status: DC | PRN
Start: 2022-05-01 — End: 2022-05-03

## 2022-05-01 MED ORDER — ONDANSETRON HCL 4 MG PO TABS: 4.0000 mg | ORAL_TABLET | Freq: Four times a day (QID) | ORAL | Status: DC | PRN

## 2022-05-01 MED ORDER — PREDNISONE 5 MG PO TABS
5.0000 mg | ORAL_TABLET | Freq: Every day | ORAL | Status: DC
  Administered 2022-05-02 – 2022-05-03 (×2): 5 mg via ORAL
  Filled 2022-05-01 (×3): qty 1

## 2022-05-01 MED ORDER — ACETAMINOPHEN 325 MG PO TABS
650.0000 mg | ORAL_TABLET | Freq: Four times a day (QID) | ORAL | Status: DC | PRN
  Administered 2022-05-01: 650 mg via ORAL
  Filled 2022-05-01: qty 2

## 2022-05-01 MED ORDER — LORAZEPAM 2 MG/ML IJ SOLN
0.2500 mg | Freq: Once | INTRAMUSCULAR | Status: AC
  Administered 2022-05-02: 0.25 mg via INTRAVENOUS
  Filled 2022-05-01: qty 1

## 2022-05-01 MED ORDER — STROKE: EARLY STAGES OF RECOVERY BOOK
Freq: Once | Status: AC
Start: 2022-05-02 — End: 2022-05-02

## 2022-05-01 MED ORDER — CHLORTHALIDONE 25 MG PO TABS
25.0000 mg | ORAL_TABLET | Freq: Every day | ORAL | Status: DC
  Filled 2022-05-01: qty 1

## 2022-05-01 NOTE — Progress Notes (Signed)
Progress Note   Patient: Robert [name] TKP:[id number] DOB: 1961-09-27 DOA: 04/30/2022     1 DOS: the patient was seen and examined on 05/01/2022   Brief hospital course: 61 year old male PMH renal transplant, diabetes mellitus type 2, presented with flank pain, dysuria and hematuria. Admitted for acute pyelonephritis.  7/25 developed bilateral facial numbness and right arm numbness and weakness, code stroke called, neurology involved.  Assessment and Plan: * Acute pyelonephritis --follow-up culture data, continue empiric abx, h/o ESBL Klebsiella  Right arm weakness/numbness and bilateral facial numbness, acute, acute stroke vs TIA --sudden onset 7/25, called code stroke, neurology involved, workup in process --apixaban   Atrial fibrillation --continue Coreg for rate control, resume apixaban (held on admission)  Benign essential hypertension --Hold hydralazine for suspected stroke for permissive hypertension  S/P bioprosthetic AVR (aortic valve replacement)  Immunosuppressed status secondary to renal transplant --Stable. --Continue prednisone 5 mg and cellcept 1000 mg bid.  Stage 3b chronic kidney disease (CKD) --stable  Type 2 diabetes mellitus with hyperglycemia, with long-term current use of insulin, diabetic neuropathy --SSI, long-acting insulin. CBG labile  Morbid obesity (HCC) Body mass index is 36.9 kg/m. --dietician consult  Hypothyroidism --Stable. Continue synthroid 112 mcg daily.  Obstructive sleep apnea -- CPAP.          Subjective:  Called by RN to see patient for "facial numbness". I arrived within 10 minutes  Pt with sudden onset bilateral facial numbness minutes before I arrived. Also w/ right arm weakness and numbness.  Some flank pain.  Also w/ headache and black spots before vision (1 week) and wavy (duration unclear).  Physical Exam: Vitals:   05/01/22 0939 05/01/22 1019 05/01/22 1050 05/01/22 1113  BP: (!) 124/45 (!) 141/71 (!)  147/61 (!) 122/59  Pulse: 67 68 66 63  Resp:   12 16  Temp:  99.2 F (37.3 C) 98.3 F (36.8 C) 97.7 F (36.5 C)  TempSrc:  Oral Oral Oral  SpO2: 100% 100% 97% 100%  Weight:      Height:       Physical Exam Vitals reviewed.  Constitutional:      General: He is not in acute distress.    Appearance: He is not ill-appearing or toxic-appearing.  Cardiovascular:     Rate and Rhythm: Normal rate and regular rhythm.     Heart sounds: No murmur heard. Pulmonary:     Effort: Pulmonary effort is normal. No respiratory distress.     Breath sounds: No wheezing, rhonchi or rales.  Musculoskeletal:     Right lower leg: No edema.     Left lower leg: No edema.  Neurological:     Mental Status: He is alert.     Cranial Nerves: No cranial nerve deficit.     Sensory: Sensory deficit (subjective decreased face bilaterally and right arm) present.     Motor: Weakness (perhaps slight right arm weakness; 4/5 right leg weakness) present.     Coordination: Coordination abnormal.     Comments: Pronator drift on right  Psychiatric:        Mood and Affect: Mood normal.        Behavior: Behavior normal.     Data Reviewed:  CBG labile Creatinine stable 2.16 K+ 3.4 CBC noted U/A positive CT nonacute  Family Communication:   Disposition: Status is: Inpatient Remains inpatient appropriate because: pyelo, acute stroke  Planned Discharge Destination:  TBD    Time spent: 35 minutes  Author: Murray Hodgkins, MD 05/01/2022 12:16 PM  For on call review [email or web address].

## 2022-05-01 NOTE — Progress Notes (Addendum)
0930- Writer at bedside performing full shift assessment with morning medication administration. Patient began to c/o of facial numbness, headache, and dizziness. NIHSS stroke scale performed, score of 0. No nausea, no chest pain, no diaphoresis. VSS.   Patient unable to take PO morning medications. Only PO medication the patient was able to take was PRN tramadol, given for headache.   Notified Dr. Sarajane Jews VIA secure chat, who will come bedside to assess patient.   1000- Dr. Sarajane Jews at bedside to assess patient, code stroke activated due to patient previous stroke history in December.   1010- Neurology and stroke team at bedside, patient transported to CT. Pending bed transfer to higher acuity   1040- Patient returned from CT, bed assigned to 3W13. Attempted to call report, patient had not been assigned. Will call again in 10 minutes   1052- Called report to Ginger, Therapist, sports

## 2022-05-01 NOTE — Progress Notes (Signed)
Pt arrived to unit, assessed, informed of POC,all needs addressed at this time 

## 2022-05-01 NOTE — Consult Note (Signed)
[facility] Nurse Consult Note: Reason for Consult:right great toe with  Wound type: trauma. Patient endorses scratching toe with toenail of contralateral foot. Pressure Injury POA: N/A Measurement: Distal tip of RGT:  0.4cm round, dry, partial thickness Lateral RGT: 0.5cm round blood filled blister Wound bed: As described above Drainage (amount, consistency, odor) None Periwound: intact. No erythema, no edema Dressing procedure/placement/frequency: I will provide Nursing with guidance for the topical care of these wounds using a NS cleanse and twice daily application of a thin layer of betadine ointment topped with a dry gauze 2x2 and secured with conform bandaging for 7 days.  Recommend patient see podiatric medicine for periodic nail debridement as he has several risk factors for delayed wound healing with skin injury (type 2 diabetes, CKD stage lll b, has had a renal transplant and an aortic valve replacement) in addition to reporting neuropathy.  If you agree, please refer post discharge.  [place] nursing team will not follow, but will remain available to this patient, the nursing and medical teams.  Please re-consult if needed.  Thank you for inviting [place] to participate in this patient's Plan of Care.  Maudie Flakes, MSN, RN, CNS, [place], Serita Grammes, [employer], [employer] phone:  [phone]

## 2022-05-01 NOTE — Progress Notes (Signed)
Occupational Therapy Evaluation Patient Details Name: TIGE MEAS MRN: [id number] DOB: [birth year] Today's Date: 05/01/2022   History of Present Illness 61 y/o male presented to ED on 05/01/2022 for R flank pain x 1 week, and hematuria for 24hr, R face and arm numbness and R arm weakness. CT negative for acute abnormality 05/01/2022. 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   Clinical Impression   PTA, pt was mod I for ADL and lived with his wife who assisted with various home management tasks. Pt reports he is currently on short term disability from work. Currently, pt performing sit<>stand transfers and LB dressing with min guard A. Performing functional mobility using RW for short distances with min guard A. Pt requiring increased time to process light and firm touch to RUE; and reporting this is new onset (different from residual deficits following prior CVA). Pt reporting being dizzy during positional changes, but VSS (see below). Pt reporting episodic double vision during finger to nose testing. Continue to assess. Pt presents with decreased balance, strength, sensation, and cognitive processing speed. Will continue to follow acutely to optimize safety and independence with ADL, however, do not anticipate pt will require OT follow up.      Recommendations for follow up therapy are one component of a multi-disciplinary discharge planning process, led by the attending physician.  Recommendations may be updated based on patient status, additional functional criteria and insurance authorization.   Follow Up Recommendations  No OT follow up    Assistance Recommended at Discharge Intermittent Supervision/Assistance  Patient can return home with the following A little help with walking and/or transfers;A little help with bathing/dressing/bathroom;Assistance with cooking/housework;Assist for  transportation;Direct supervision/assist for medications management;Direct supervision/assist for financial management    Functional Status Assessment  Patient has had a recent decline in their functional status and demonstrates the ability to make significant improvements in function in a reasonable and predictable amount of time.  Equipment Recommendations  None recommended by OT    Recommendations for Other Services PT consult     Precautions / Restrictions Precautions Precautions: Fall Restrictions Weight Bearing Restrictions: No      Mobility Bed Mobility Overal bed mobility: Needs Assistance Bed Mobility: Rolling, Sidelying to Sit, Sit to Sidelying Rolling: Supervision Sidelying to sit: Supervision     Sit to sidelying: Supervision General bed mobility comments: supervision for safety. Pt reporting dizziness upon sitting EOB.    Transfers Overall transfer level: Needs assistance Equipment used: Rolling walker (2 wheels) Transfers: Sit to/from Stand Sit to Stand: Min guard           General transfer comment: Min Guard A for safety. Pt with knee buckling upon standing, relying on RW to maintain balance/upright position. Pt educated on staying in place to get balance prior to attempting ambulation      Balance Overall balance assessment: Needs assistance Sitting-balance support: No upper extremity supported Sitting balance-Leahy Scale: Fair Sitting balance - Comments: doning socks sitting EOB; observed to support self while bringing each LE up into figure 4 position. When performing MMT, observed with decreased core strength, as indicated by trunk flexion/extension during MMT of shoulders/arms   Standing balance support: Bilateral upper extremity supported Standing balance-Leahy Scale: Poor Standing balance comment: reliant on RW. Providing min guard A for safety  ADL either performed or assessed with clinical judgement    ADL Overall ADL's : Needs assistance/impaired Eating/Feeding: Set up;Sitting   Grooming: Min guard;Standing   Upper Body Bathing: Set up;Sitting   Lower Body Bathing: Min guard;Sit to/from stand   Upper Body Dressing : Set up;Sitting   Lower Body Dressing: Min guard;Sit to/from stand Lower Body Dressing Details (indicate cue type and reason): Pt requiring min guard A to pull up pants upon standing. Donning socks as well during session without A. Toilet Transfer: Min guard;Rolling walker (2 wheels);Ambulation;Regular Glass blower/designer Details (indicate cue type and reason): Simulated toilet transfer with Min Guard A         Functional mobility during ADLs: Min guard;Rolling walker (2 wheels) General ADL Comments: Pt performing LB dressing with min Guard A and short distance ~36f functional mobility with min guard A.     Vision Baseline Vision/History: 0 No visual deficits Patient Visual Report: No change from baseline Vision Assessment?: Yes Additional Comments: Pt reporting double vision during finger to nose testing. Pt reporting however, that this is episodic and reporting he only sees one therapist, computer, etc. Pt reports this happens sometime when he gets dizzy since aortic valve replacement in Dec 2022. Otherwise no changes in vision. Pt requiring increased time to perform finger to nose. Continue to assess.     Perception     Praxis      Pertinent Vitals/Pain Pain Assessment Pain Assessment: Faces Faces Pain Scale: Hurts little more Pain Location: R flank pain Pain Descriptors / Indicators: Discomfort, Aching Pain Intervention(s): Limited activity within patient's tolerance, Monitored during session     Hand Dominance Left   Extremity/Trunk Assessment Upper Extremity Assessment Upper Extremity Assessment: Generalized weakness;RUE deficits/detail;LUE deficits/detail RUE Deficits / Details: 4-/5 grip strength, shoulder flexion/ext/ Pt observed with poor  core strength as indicated by easily being pushed/pulled forward/back during MMT. Pt with decreased light touch sensation, requiring increased time to process sensations on hands, namely palmar surface of hands and fingers. Performing finger opposition WFL BUE. RUE Sensation: decreased light touch (Periperal neuropathy reported in feet, however, reporting that change in sensation is new in hands) RUE Coordination:  (Increased time) LUE Deficits / Details: 4+/5 shoulder flexion, with decreased core strength notable during testing. LUE Sensation:  (slightly increased time, however, R>L)   Lower Extremity Assessment Lower Extremity Assessment: Defer to PT evaluation       Communication Communication Communication: No difficulties (Increased time for processing)   Cognition Arousal/Alertness: Awake/alert Behavior During Therapy: WFL for tasks assessed/performed Overall Cognitive Status: No family/caregiver present to determine baseline cognitive functioning Area of Impairment: Attention, Following commands, Problem solving                   Current Attention Level: Selective   Following Commands: Follows one step commands consistently, Follows one step commands with increased time, Follows multi-step commands with increased time     Problem Solving: Slow processing, Requires verbal cues General Comments: Pt pleasant and conversational throughout session. Pt hyperaware of current deficits/changes in health. Pt with skill to perform delayed memory recall with no verbal cues this session. Pt requires incresaed time for processing verbal commands. Pt following 1-2 step commands consistently, however, increased time for processing and initiation of tasks. Demonstrating selective attention as indicated by focus on therapist with other healthcare provider in room at end of session.     General Comments  VSS, however, pt reporting BO taken during session is low for him and  that his BP is  typically around 160/90. During session sitting EOB: 119/67(82); standing 122/60 (80); sitting EOB: 139/76(95)    Exercises     Shoulder Instructions      Home Living Family/patient expects to be discharged to:: Private residence Living Arrangements: Spouse/significant other Available Help at Discharge: Family Type of Home: House Home Access: Stairs to enter Technical brewer of Steps: 3 Entrance Stairs-Rails: Right Home Layout: Two level;Able to live on main level with bedroom/bathroom Alternate Level Stairs-Number of Steps: 2 flights of steps Alternate Level Stairs-Rails: Right Bathroom Shower/Tub: Occupational psychologist: Standard     Home Equipment: Shower seat;Grab bars - tub/shower;Hand held shower head;Adaptive equipment;Cane - single point Adaptive Equipment: Reacher;Sock aid Additional Comments: Pt reporting that wife is available to help after work      Prior Functioning/Environment Prior Level of Function : Needs assist       Physical Assist :  (IADL)     Mobility Comments: cane ADLs Comments: Requires assistance for home management; provided by wife. Pt reports that he has difficulty with time management and thus, often has difficulty being able to cook and clean. Pt reports he is currently on short term disability from work        OT Problem List: Decreased strength;Decreased activity tolerance;Impaired balance (sitting and/or standing);Decreased cognition;Decreased safety awareness;Impaired sensation;Pain      OT Treatment/Interventions:      OT Goals(Current goals can be found in the care plan section) Acute Rehab OT Goals Patient Stated Goal: Get better OT Goal Formulation: With patient Time For Goal Achievement: 05/15/22 Potential to Achieve Goals: Good ADL Goals Pt Will Perform Grooming: with modified independence;standing Pt Will Perform Lower Body Dressing: with modified independence;sit to/from stand Pt Will Transfer to Toilet:  with modified independence;ambulating;regular height toilet  OT Frequency:      Co-evaluation              AM-PAC OT "6 Clicks" Daily Activity     Outcome Measure Help from another person eating meals?: A Little Help from another person taking care of personal grooming?: A Little Help from another person toileting, which includes using toliet, bedpan, or urinal?: A Little Help from another person bathing (including washing, rinsing, drying)?: A Little Help from another person to put on and taking off regular upper body clothing?: A Little Help from another person to put on and taking off regular lower body clothing?: A Little 6 Click Score: 18   End of Session Equipment Utilized During Treatment: Gait belt;Rolling walker (2 wheels) Nurse Communication: Mobility status  Activity Tolerance: Patient tolerated treatment well Patient left: in bed;Other (comment) (with ECHO in room)  OT Visit Diagnosis: Unsteadiness on feet (R26.81);Muscle weakness (generalized) (M62.81);Other abnormalities of gait and mobility (R26.89);Dizziness and giddiness (R42)                Time: 9233-0076 OT Time Calculation (min): 30 min Charges:  OT General Charges $OT Visit: 1 Visit OT Evaluation $OT Eval Moderate Complexity: 1 Mod OT Treatments $Self Care/Home Management : 8-22 mins  Shanda Howells, OTR/L [facility] Acute Rehabilitation Office: [phone]   [id number] H [id number] 05/01/2022, 4:00 PM

## 2022-05-01 NOTE — Code Documentation (Signed)
Stroke Response Nurse Documentation Code Documentation  OAK DOREY is a 61 y.o. male with past medical hx of stroke, coronary artery disease, Afib on eliquis, hypertension, hypothyroidism, diabetes, obstructive sleep apnea, ESRD with history of HD and obesity. Code stroke activated 05/01/2022 on 2W where he was LKW at 0930 and now complaining of R face and arm numbness and right arm weakness.   Stroke team met patient at bedside and patient to CT with team. NIHSS 3, see documentation for details and code stroke times. Patient with right arm weakness, right leg weakness, and right decreased sensation on exam. Baseline right leg weakness. The following imaging was completed:  CT Head. Patient is not a candidate for IV Thrombolytic due to too mild to treat. Patient is not a candidate for IR due to no LVO.   Care Plan: Q30 assessments until out of window at 1500. Transferred to 3W for closer monitoring. CTA if worsening of symptoms before 1500.    Bedside handoff with Emilio Aspen RN.   Efrain Clauson RN Stroke Response RN

## 2022-05-01 NOTE — Consult Note (Addendum)
Neurology Consultation  Reason for Consult: Code stroke  Referring Physician: Dr. Sarajane Jews  CC: facial numbness and right arm numbness and weakness  History is obtained from:patient and medical record   HPI: Robert [name] is a 61 y.o. male with past medical history of prior CVA, CAD, PAF on Eliquis, CAD, HTN, HLD, CKD, s/p renal transplant on immunosuppressants, Anemia, DM, Diabetic neuropathy, hypothyroidism, OSA on BIPAP, obesity who presented to the hospital on 7/24 for right flank pain, dysuria and hematuria. This am on 7/25, LKW 0930 when he developed sudden onset of bilateral facial numbness and right arm numbness with weakness. He c/o headache and dizziness as well which are not new symptoms. Code Stroke was activated. Neurology team arrived at the patients bedside. NIHSS 3. Transported to CT for STAT head with no acute abnormality. Symptoms to mild to treat, will continue with close monitoring Q30 minutes neuro checks/VS until 1300   LKW: 0930 IV thrombolysis given?: no, symptoms to mild Premorbid modified Rankin scale (mRS):  0-Completely asymptomatic and back to baseline post-stroke  ROS: Full ROS was performed and is negative except as noted in the HPI.    Past Medical History:  Diagnosis Date   Acute cystitis without hematuria 11/09/2021   AKI (acute kidney injury) 08/08/2017   Elevated sCr to 2.5 (baseline ~1.7) noted at OSH - Subsequent transfer to [facility]  - Currently on IVMF in the setting of diarrhea and AKI - sCr at baseline at time of discharge   Anemia in chronic kidney disease 03/30/2015   Anticoagulated on Coumadin 11/09/2021   Aortic valve stenosis    Arthritis    Atrial fibrillation 11/29/2021   Benign essential hypertension 07/30/2014   prior to transplant pt took Metorpolol tartrate '50mg'$  qd on off HD days   Benign neoplasm of transverse colon    Cancer    skin ca right shoulder, plastic dsyplasia, pre-Ca polpys removed on Colonoscopy- 07/2014    Cerebrovascular accident (CVA) due to embolism of precerebral artery    Chronic renal failure    Colon polyps 07/23/2014   Tubular adenomas x 5   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   Diverticulosis of colon without hemorrhage    Eczema    ESRD (end stage renal disease)    hemodialysis 06/2014-02/2017, s/p living unrelated kidney transplant 02/26/17   Exertional dyspnea    Fatigue 01/08/2014   Fissure of skin 03/08/2020   GI bleed 07/26/2017   Headache    Heart murmur    aortic stenosis (moderate-severe 04/2021)   History of COVID-19 2023   History of unilateral nephrectomy 09/06/2013   Hydrocele, acquired 04/07/2017   Hyperlipidemia 09/06/2013   Hypertensive urgency 10/21/2021   Hyponatremia 11/09/2021   Hypothyroidism    Immunosuppressed status 02/26/2017   Induction agent: Solumedrol - Envarsus '3mg'$  daily  - Home dose decreased from '5mg'$  to '3mg'$  in the setting of FK 15.7 while inpatient   - Previously on Prograf, though d/c'ed during previous admission; suspected this may be causing hallucinations  - Cellcept '1000mg'$  Q12H  - Prednisone '5mg'$  daily   Inguinal hernia without obstruction or gangrene 06/30/2017   Right inguinal wall defect appreciated by CT imaging [place] and on exam Dr. Paulla Fore today Dr. Paulla Fore has discussed hernia repair with mesh  at some point in near future  Last Assessment & Plan:  Formatting of this note might be different from the original. Right inguinal wall defect appreciated by CT imaging [place] and on exam Dr. Paulla Fore today Dr. Paulla Fore has discussed hern   Kidney transplanted 02/26/2017   Date of Transplant = 02/26/17  ABO (Donor/Recipient) = compatible Blood type: O+/A+ DonorType: Living donor, unrelated Allograft type:Left kidney Donor  anatomy: 2 arteries, 1 veins and single ureter  Pants-to-skirt anastomosis of dual renal arteries on the backtable. Donor Kidney Bx: Not applicable Allograft injury/complications: None Ne   Lower extremity edema 09/06/2013   Macular degeneration    Mild vascular neurocognitive disorder 02/08/2022   OAB (overactive bladder) 12/28/2019   Obstructive sleep apnea 09/06/2013   On Bi-PAP  Last Assessment & Plan:  Formatting of this note might be different from the original. - History of OSA, continue CPAP at night   Peritoneal dialysis catheter in place 02/24/2015   RLQ abdominal pain 04/19/2020   S/P AVR (aortic valve replacement) 10/05/2021   SBO (small bowel obstruction) 05/2015   Scrotal edema 03/14/2017   Testicular ultrasound did show hydrocele  Will refer to urology regarding ongoign swelling, hydrocele.   Last Assessment & Plan:  Formatting of this note might be different from the original.   Testicular ultrasound did show hydrocele  Will refer to urology regarding ongoign swelling, hydrocele.   Shortness of breath    with exertion   Stage 3b chronic kidney disease (CKD) 03/22/2014   SVT (supraventricular tachycardia) 09/06/2013   Tremor of both hands 06/29/2017   Last Assessment & Plan:  Present since transplant, stable and unchanged Query from tacrolimus -Improved tremors noted and verbalized by pt - Prograf doses titrated per FK levels  Last Assessment & Plan:  Formatting of this note might be different from the original. Present since transplant, stable and unchanged Query from tacrolimus -Improved tremors noted and verbalized by pt - Prograf doses titr   Type 2 diabetes mellitus with hyperglycemia, with long-term current use of insulin 67/20/9470   Umbilical hernia    s/p repair 04/28/15     Family History  Problem Relation Age of Onset   Colon polyps Father    Stroke Father    Dementia Father        vascular   Colon cancer Neg Hx    Stomach cancer Neg Hx      Social  History:   reports that he quit smoking about 9 years ago. His smoking use included cigars. He has never used smokeless tobacco. He reports that he does not currently use alcohol. He reports that he does not use drugs.  Medications  Current Facility-Administered Medications:    0.9 %  sodium chloride infusion, , Intravenous, Continuous, Samuella Cota, MD   acetaminophen (TYLENOL) tablet 650 mg, 650 mg, Oral, Q6H PRN **OR** acetaminophen (TYLENOL) suppository 650 mg, 650 mg, Rectal, Q6H PRN, Kristopher Oppenheim, DO   amLODipine (NORVASC) tablet 5 mg, 5 mg, Oral, Daily, Kristopher Oppenheim, DO, 5 mg at 05/01/22 9628   apixaban (ELIQUIS) tablet 5 mg, 5 mg, Oral, BID, Kristopher Oppenheim, DO   carvedilol (COREG) tablet 25 mg, 25 mg, Oral, BID WC, Kristopher Oppenheim, DO   chlorthalidone (HYGROTON) tablet 25 mg, 25 mg, Oral, Daily, Kristopher Oppenheim, DO   hydrALAZINE (APRESOLINE) tablet 100 mg, 100 mg, Oral, Q8H, Chen, Eric, DO, 100 mg at 05/01/22 3662   insulin aspart (novoLOG) injection 0-20 Units, 0-20 Units, Subcutaneous, TID WC,  Kristopher Oppenheim, DO, 4 Units at 05/01/22 8413   insulin aspart (novoLOG) injection 0-5 Units, 0-5 Units, Subcutaneous, QHS, Kristopher Oppenheim, DO   insulin glargine-yfgn [facility]) injection 20 Units, 20 Units, Subcutaneous, QHS, Kristopher Oppenheim, DO   levothyroxine (SYNTHROID) tablet 112 mcg, 112 mcg, Oral, Q0600, Kristopher Oppenheim, DO, 112 mcg at 05/01/22 0625   meropenem (MERREM) 1 g in sodium chloride 0.9 % 100 mL IVPB, 1 g, Intravenous, Q12H, Kristopher Oppenheim, DO, Last Rate: 200 mL/hr at 05/01/22 0924, 1 g at 05/01/22 2440   mycophenolate (CELLCEPT) capsule 1,000 mg, 1,000 mg, Oral, BID, Kristopher Oppenheim, DO   ondansetron [facility]) tablet 4 mg, 4 mg, Oral, Q6H PRN **OR** ondansetron (ZOFRAN) injection 4 mg, 4 mg, Intravenous, Q6H PRN, Kristopher Oppenheim, DO   povidone-iodine (BETADINE) 10 % ointment, , Topical, BID, Samuella Cota, MD, Given at 05/01/22 1027   predniSONE (DELTASONE) tablet 5 mg, 5 mg, Oral, Q breakfast, Kristopher Oppenheim, DO    rosuvastatin (CRESTOR) tablet 20 mg, 20 mg, Oral, Daily, Kristopher Oppenheim, DO, 20 mg at 05/01/22 2536   traMADol (ULTRAM) tablet 50 mg, 50 mg, Oral, Q4H PRN, Kristopher Oppenheim, DO, 50 mg at 05/01/22 0931   zolpidem (AMBIEN) tablet 5 mg, 5 mg, Oral, QHS PRN, Kristopher Oppenheim, DO   Exam: Current vital signs: BP (!) 141/71 (BP Location: Left Arm)   Pulse 68   Temp 99.2 F (37.3 C) (Oral)   Resp 19   Ht '5\' 11"'$  (1.803 m)   Wt 120 kg   SpO2 100%   BMI 36.90 kg/m  Vital signs in last 24 hours: Temp:  [97.6 F (36.4 C)-99.2 F (37.3 C)] 99.2 F (37.3 C) (07/25 1019) Pulse Rate:  [64-72] 68 (07/25 1019) Resp:  [12-19] 19 (07/25 0811) BP: (112-154)/(45-81) 141/71 (07/25 1019) SpO2:  [96 %-100 %] 100 % (07/25 1019) Weight:  [120 kg] 120 kg (07/24 1242)  GENERAL: Awake, alert in NAD HEENT: - Normocephalic and atraumatic, dry mm LUNGS - Clear to auscultation bilaterally with no wheezes CV - S1S2 RRR, no m/r/g, equal pulses bilaterally. ABDOMEN - Soft, nontender, nondistended with normoactive BS Ext: warm, well perfused, intact peripheral pulses, no edema. AV graft right arm  NEURO:  Mental Status: AA&Ox4 Language: speech is clear.  Naming, repetition, fluency, and comprehension intact. Cranial Nerves: PERRL, EOMI, visual fields full, no facial asymmetry, facial sensation intact, hearing intact, tongue/uvula/soft palate midline, normal sternocleidomastoid and trapezius muscle strength. No evidence of tongue atrophy or fibrillations Motor: 5/5 in bilateral uppers, left lower 5/5, right lower 4/5 (not new) Tone: is normal and bulk is normal Sensation- decreased on right Coordination: FTN intact bilaterally, no ataxia in BLE. Gait- deferred  NIHSS 1a Level of Conscious.: 0 1b LOC Questions: 0 1c LOC Commands: 0 2 Best Gaze: 0 3 Visual: 0 4 Facial Palsy: 0 5a Motor Arm - left: 0 5b Motor Arm - Right: 1 6a Motor Leg - Left: 0 6b Motor Leg - Right: 1 7 Limb Ataxia: 0 8 Sensory: 1 9 Best Language:  0 10 Dysarthria: 0 11 Extinct. and Inatten.: 0 TOTAL: 3   Labs I have reviewed labs in epic and the results pertinent to this consultation are:  CBC    Component Value Date/Time   WBC 7.5 05/01/2022 0354   RBC 4.23 05/01/2022 0354   HGB 11.9 (L) 05/01/2022 0354   HGB 15.1 08/18/2021 0953   HCT 36.4 (L) 05/01/2022 0354   HCT 45.8 08/18/2021 0953   PLT 222 05/01/2022 0354  PLT 279 08/18/2021 0953   MCV 86.1 05/01/2022 0354   MCV 84 08/18/2021 0953   MCH 28.1 05/01/2022 0354   MCHC 32.7 05/01/2022 0354   RDW 14.4 05/01/2022 0354   RDW 14.3 08/18/2021 0953   LYMPHSABS 1.0 05/01/2022 0354   LYMPHSABS 0.9 11/13/2016 0909   MONOABS 0.7 05/01/2022 0354   EOSABS 0.6 (H) 05/01/2022 0354   EOSABS 0.2 11/13/2016 0909   BASOSABS 0.1 05/01/2022 0354   BASOSABS 0.0 11/13/2016 0909    CMP     Component Value Date/Time   NA 137 05/01/2022 0354   NA 139 04/19/2022 1517   K 3.4 (L) 05/01/2022 0354   CL 103 05/01/2022 0354   CO2 25 05/01/2022 0354   GLUCOSE 119 (H) 05/01/2022 0354   BUN 41 (H) 05/01/2022 0354   BUN 43 (H) 04/19/2022 1517   CREATININE 2.16 (H) 05/01/2022 0354   CALCIUM 9.6 05/01/2022 0354   CALCIUM 8.5 04/07/2014 1057   PROT 5.8 (L) 05/01/2022 0354   PROT 6.8 04/19/2022 1517   ALBUMIN 3.5 05/01/2022 0354   ALBUMIN 4.8 04/19/2022 1517   AST 15 05/01/2022 0354   ALT 13 05/01/2022 0354   ALKPHOS 59 05/01/2022 0354   BILITOT 0.5 05/01/2022 0354   BILITOT 0.4 04/19/2022 1517   GFRNONAA 34 (L) 05/01/2022 0354   GFRAA 39 (L) 03/03/2020 0936    Lipid Panel     Component Value Date/Time   CHOL 109 10/13/2021 0655   TRIG 66 10/13/2021 0655   HDL 31 (L) 10/13/2021 0655   CHOLHDL 3.5 10/13/2021 0655   VLDL 13 10/13/2021 0655   LDLCALC 65 10/13/2021 0655     Imaging I have reviewed the images obtained:  Code Stroke CT-head 1. No acute intracranial abnormality. No significant change from prior CT. 2. ASPECTS is 10.  Assessment:  Robert [name] is a  61 y.o. male with past medical history of prior CVA, CAD, PAF on Eliquis, CAD, HTN, HLD, CKD, s/p renal transplant on immunosuppressants, Anemia, DM, Diabetic neuropathy, hypothyroidism, OSA on BIPAP, obesity who presented to the hospital on 7/24 for right flank pain, dysuria and hematuria. This am on 7/25, LKW 0930 when he developed sudden onset of bilateral facial numbness and right arm numbness with weakness. He c/o headache and dizziness as well which are not new symptoms. Code Stroke was activated.   Acute left hemisphere ischemic stroke vs TIA  Recommendations: - fasting lipid panel - MRI /MRA of the brain without contrast - Frequent neuro checks - bedside swallow eval - Prophylactic therapy-Antiplatelet med: Aspirin - dose '81mg'$   - Risk factor modification - Telemetry monitoring - PT consult, OT consult, Speech consult - Stroke team to follow   Beulah Gandy DNP, ACNPC-AG   Attending attestation  I have seen the patient reviewed the above note.  He has right face and arm numbness, with right pronator drift that started abruptly.  He is not a candidate for lytic therapy due to anticoagulation.  I would favor holding anticoagulation for the time being until MRI is performed.  He will need a stroke work-up  Roland Rack, MD [facility] [phone]  If 7pm- 7am, please page neurology on call as listed in [place].

## 2022-05-01 NOTE — Hospital Course (Signed)
61 year old male PMH renal transplant, diabetes mellitus type 2, presented with flank pain, dysuria and hematuria. Admitted for acute pyelonephritis.  7/25 developed bilateral facial numbness and right arm numbness and weakness, code stroke called, neurology involved.

## 2022-05-01 NOTE — Evaluation (Signed)
Physical Therapy Evaluation Patient Details Name: Robert [name] MRN: [id number] DOB: June 10, 1961 Today's Date: 05/01/2022  History of Present Illness  61 y/o male presented to ED on 05/01/2022 for R flank pain x 1 week, and hematuria for 24hr, R face and arm numbness and R arm weakness. CT negative for acute abnormality 05/01/2022. PMH includes IDDM, CKD s/p renal transplant, HTN, CAD s/p CABG 38/9373, embolic CVA subcortical L frontal lobe with [name] residual weakness.  Clinical Impression  Patient presents with decreased mobility due to deficits listed in PT problem list. Currently minguard A for ambulation to doorway of his room and limited by weakness and dizziness and follow up CBG noted to be 51.  Normally mobilizes with his cane independently and able to manage while his wife is at work.  Feel he will benefit from skilled PT in the acute setting and from follow up [place] at d/c.        Recommendations for follow up therapy are one component of a multi-disciplinary discharge planning process, led by the attending physician.  Recommendations may be updated based on patient status, additional functional criteria and insurance authorization.  Follow Up Recommendations Home health PT      Assistance Recommended at Discharge Intermittent Supervision/Assistance  Patient can return home with the following  A little help with bathing/dressing/bathroom;Assistance with cooking/housework;Help with stairs or ramp for entrance;Assist for transportation    Equipment Recommendations Rolling walker (2 wheels)  Recommendations for Other Services       Functional Status Assessment Patient has had a recent decline in their functional status and demonstrates the ability to make significant improvements in function in a reasonable and predictable amount of time.     Precautions / Restrictions Precautions Precautions: Fall Restrictions Weight Bearing Restrictions: No      Mobility  Bed  Mobility Overal bed mobility: Needs Assistance Bed Mobility: Supine to Sit, Sit to Supine     Supine to sit: Supervision, HOB elevated Sit to supine: Supervision   General bed mobility comments: increased time, monitoring for IV and due to dizziness    Transfers Overall transfer level: Needs assistance Equipment used: Straight cane Transfers: Sit to/from Stand Sit to Stand: Min guard, Supervision           General transfer comment: assist for balance once upright    Ambulation/Gait Ambulation/Gait assistance: Min guard Gait Distance (Feet): 20 Feet Assistive device: Rolling walker (2 wheels), Straight cane Gait Pattern/deviations: Step-to pattern, Decreased stride length, Step-through pattern, Shuffle       General Gait Details: assist for balance, using cane and very slow to the door of the room, switched to RW as pt felt imbalance then pt reported more dizziness and weakness so returned to bed with RW and minguard for balance.  Stairs            Wheelchair Mobility    Modified Rankin (Stroke Patients Only) Modified Rankin (Stroke Patients Only) Pre-Morbid Rankin Score: Moderate disability Modified Rankin: Moderately severe disability     Balance Overall balance assessment: Needs assistance Sitting-balance support: No upper extremity supported Sitting balance-Leahy Scale: Good     Standing balance support: Single extremity supported Standing balance-Leahy Scale: Poor Standing balance comment: using cane initially, but felt unstable so switched to RW                             Pertinent Vitals/Pain Pain Assessment Pain Assessment: Faces Faces Pain Scale: Hurts little  more Pain Location: R flank pain and headache Pain Descriptors / Indicators: Discomfort, Aching Pain Intervention(s): Monitored during session, Limited activity within patient's tolerance    Home Living Family/patient expects to be discharged to:: Private  residence Living Arrangements: Spouse/significant other Available Help at Discharge: Family Type of Home: House Home Access: Stairs to enter Entrance Stairs-Rails: Right Entrance Stairs-Number of Steps: 3 Alternate Level Stairs-Number of Steps: 2 flights of steps Home Layout: Two level;Able to live on main level with bedroom/bathroom Home Equipment: Shower seat;Grab bars - tub/shower;Hand held shower head;Adaptive equipment;Cane - single point Additional Comments: Pt reporting that wife is available to help after work    Prior Function Prior Level of Function : Needs assist       Physical Assist :  (IADL)     Mobility Comments: cane ADLs Comments: Requires assistance for home management; provided by wife. Pt reports that he has difficulty with time management and thus, often has difficulty being able to cook and clean. Pt reports he is currently on short term disability from work     Hand Dominance   Dominant Hand: Left    Extremity/Trunk Assessment   Upper Extremity Assessment Upper Extremity Assessment: Defer to OT evaluation RUE Deficits / Details: 4-/5 grip strength, shoulder flexion/ext/ Pt observed with poor core strength as indicated by easily being pushed/pulled forward/back during MMT. Pt with decreased light touch sensation, requiring increased time to process sensations on hands, namely palmar surface of hands and fingers. Performing finger opposition WFL BUE. RUE Sensation: decreased light touch (Periperal neuropathy reported in feet, however, reporting that change in sensation is new in hands) RUE Coordination:  (Increased time) LUE Deficits / Details: 4+/5 shoulder flexion, with decreased core strength notable during testing. LUE Sensation:  (slightly increased time, however, R>L)    Lower Extremity Assessment Lower Extremity Assessment: [name] deficits/detail [name] Deficits / Details: AROM WFL, strength hip flexion 3+/5, knee extension 4-/5, ankle DF 3-/5 [name]  Sensation: decreased light touch [name] Coordination: decreased gross motor       Communication   Communication: No difficulties  Cognition Arousal/Alertness: Awake/alert Behavior During Therapy: WFL for tasks assessed/performed Overall Cognitive Status: Within Functional Limits for tasks assessed                                          General Comments General comments (skin integrity, edema, etc.): BP 141/75 after ambulation, but NT checked CBG and was 51 after ambulation    Exercises     Assessment/Plan    PT Assessment Patient needs continued PT services  PT Problem List Decreased strength;Decreased mobility;Decreased activity tolerance;Decreased balance;Impaired sensation       PT Treatment Interventions DME instruction;Therapeutic exercise;Gait training;Balance training;Stair training;Functional mobility training;Therapeutic activities;Patient/family education    PT Goals (Current goals can be found in the Care Plan section)  Acute Rehab PT Goals Patient Stated Goal: to feel better PT Goal Formulation: With patient Time For Goal Achievement: 05/15/22 Potential to Achieve Goals: Good    Frequency Min 4X/week     Co-evaluation               AM-PAC PT "6 Clicks" Mobility  Outcome Measure Help needed turning from your back to your side while in a flat bed without using bedrails?: None Help needed moving from lying on your back to sitting on the side of a flat bed without using bedrails?: None  Help needed moving to and from a bed to a chair (including a wheelchair)?: A Little Help needed standing up from a chair using your arms (e.g., wheelchair or bedside chair)?: A Little Help needed to walk in hospital room?: A Little Help needed climbing 3-5 steps with a railing? : Total 6 Click Score: 18    End of Session Equipment Utilized During Treatment: Gait belt Activity Tolerance: Treatment limited secondary to medical complications (Comment) (low  CBG) Patient left: in bed;with nursing/sitter in room   PT Visit Diagnosis: Other abnormalities of gait and mobility (R26.89);Muscle weakness (generalized) (M62.81);Hemiplegia and hemiparesis Hemiplegia - Right/Left: Right Hemiplegia - dominant/non-dominant: Non-dominant Hemiplegia - caused by: Unspecified (MRI pending)    Time: 4765-4650 PT Time Calculation (min) (ACUTE ONLY): 25 min   Charges:   PT Evaluation $PT Eval Moderate Complexity: 1 Mod PT Treatments $Gait Training: 8-22 mins        Magda Kiel, PT Acute Rehabilitation Services Office:[phone] 05/01/2022   Robert [name] 05/01/2022, 5:46 PM

## 2022-05-01 NOTE — Progress Notes (Addendum)
Hypoglycemic Event  CBG: 51  Treatment: 8 oz juice/soda  Symptoms:  dizzy  Follow-up CBG: Time: 1708    CBG Result: 67 2nd follow up CBG time - 1750 CBG result - 74  Possible Reasons for Event: Other: pt has an internal insulin delivery system; notified MD and sliding scale and symglee were discontinued.  Orders in for insulin pump usage now.  Comments/MD notified:Dr. Sarajane Jews notified (see above)    Robert [name]

## 2022-05-01 NOTE — Progress Notes (Addendum)
Hypoglycemic Event  CBG: 63  Treatment: 8 oz juice/soda  Symptoms: None  Follow-up CBG: Time:2226 CBG Result:91  Possible Reasons for Event: Medication regimen:    Comments/MD notified: Dr. Harrington Challenger

## 2022-05-02 ENCOUNTER — Encounter: Payer: Self-pay | Admitting: Cardiovascular Disease

## 2022-05-02 ENCOUNTER — Ambulatory Visit: Payer: 59 | Admitting: Nutrition

## 2022-05-02 DIAGNOSIS — N390 Urinary tract infection, site not specified: Secondary | ICD-10-CM | POA: Diagnosis not present

## 2022-05-02 DIAGNOSIS — I480 Paroxysmal atrial fibrillation: Secondary | ICD-10-CM | POA: Diagnosis not present

## 2022-05-02 DIAGNOSIS — N12 Tubulo-interstitial nephritis, not specified as acute or chronic: Secondary | ICD-10-CM | POA: Diagnosis not present

## 2022-05-02 DIAGNOSIS — I10 Essential (primary) hypertension: Secondary | ICD-10-CM | POA: Diagnosis not present

## 2022-05-02 LAB — URINE CULTURE: Culture: >=100000 — AB

## 2022-05-02 LAB — CBC
HCT: 36 % — ABNORMAL LOW (ref 39.0–52.0)
Hemoglobin: 11.8 g/dL — ABNORMAL LOW (ref 13.0–17.0)
MCH: 28.3 pg (ref 26.0–34.0)
MCHC: 32.8 g/dL (ref 30.0–36.0)
MCV: 86.3 fL (ref 80.0–100.0)
Platelets: 213 10*3/uL (ref 150–400)
RBC: 4.17 MIL/uL — ABNORMAL LOW (ref 4.22–5.81)
RDW: 14.4 % (ref 11.5–15.5)
WBC: 7.6 10*3/uL (ref 4.0–10.5)
nRBC: 0 % (ref 0.0–0.2)

## 2022-05-02 LAB — LIPID PANEL
Cholesterol: 116 mg/dL (ref 0–200)
HDL: 52 mg/dL (ref >40)
LDL Cholesterol: 55 mg/dL (ref 0–99)
Total CHOL/HDL Ratio: 2.2 RATIO
Triglycerides: 45 mg/dL (ref <150)
VLDL: 9 mg/dL (ref 0–40)

## 2022-05-02 LAB — GLUCOSE, CAPILLARY
Glucose-Capillary: 104 mg/dL — ABNORMAL HIGH (ref 70–99)
Glucose-Capillary: 148 mg/dL — ABNORMAL HIGH (ref 70–99)
Glucose-Capillary: 82 mg/dL (ref 70–99)
Glucose-Capillary: 61 mg/dL — ABNORMAL LOW (ref 70–99)

## 2022-05-02 LAB — PROTIME-INR
INR: 1.1 (ref 0.8–1.2)
Prothrombin Time: 13.6 seconds (ref 11.4–15.2)

## 2022-05-02 LAB — HEMOGLOBIN A1C
Hgb A1c MFr Bld: 7.4 % — ABNORMAL HIGH (ref 4.8–5.6)
Mean Plasma Glucose: 165.68 mg/dL

## 2022-05-02 MED ORDER — CHLORTHALIDONE 25 MG PO TABS
25.0000 mg | ORAL_TABLET | Freq: Every day | ORAL | Status: DC
  Administered 2022-05-02 – 2022-05-03 (×2): 25 mg via ORAL
  Filled 2022-05-02 (×2): qty 1

## 2022-05-02 MED ORDER — MECLIZINE HCL 25 MG PO TABS
25.0000 mg | ORAL_TABLET | Freq: Three times a day (TID) | ORAL | Status: DC | PRN
Start: 2022-05-02 — End: 2022-05-03

## 2022-05-02 MED ORDER — AMLODIPINE BESYLATE 5 MG PO TABS
5.0000 mg | ORAL_TABLET | Freq: Every day | ORAL | Status: DC
  Administered 2022-05-02 – 2022-05-03 (×2): 5 mg via ORAL
  Filled 2022-05-02 (×2): qty 1

## 2022-05-02 MED ORDER — APIXABAN 5 MG PO TABS
5.0000 mg | ORAL_TABLET | Freq: Two times a day (BID) | ORAL | Status: DC
  Administered 2022-05-02 – 2022-05-03 (×3): 5 mg via ORAL
  Filled 2022-05-02 (×3): qty 1

## 2022-05-02 NOTE — Progress Notes (Signed)
Physical Therapy Treatment Patient Details Name: Robert [name] MRN: [id number] DOB: 11-25-1960 Today's Date: 05/02/2022   History of Present Illness 61 y/o male presented to ED on 05/01/2022 for R flank pain x 1 week, and hematuria for 24hr, R face and arm numbness and R arm weakness. CT negative for acute abnormality 05/01/2022. MRI negative for acute abnormaility. 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    PT Comments    Pt making progress with mobility. Continues to have dizziness which he reports has been a problem for a while. Has been trying to see an ENT for this. Reports spinning sensation and reports worsens with head turns. Next session will have PT perform vestibular assessment.    Recommendations for follow up therapy are one component of a multi-disciplinary discharge planning process, led by the attending physician.  Recommendations may be updated based on patient status, additional functional criteria and insurance authorization.  Follow Up Recommendations  Home health PT     Assistance Recommended at Discharge Intermittent Supervision/Assistance  Patient can return home with the following Help with stairs or ramp for entrance   Equipment Recommendations  None recommended by PT    Recommendations for Other Services       Precautions / Restrictions Precautions Precautions: Fall Restrictions Weight Bearing Restrictions: No     Mobility  Bed Mobility               General bed mobility comments: Pt up on EOB    Transfers Overall transfer level: Needs assistance Equipment used: Rolling walker (2 wheels), Straight cane Transfers: Sit to/from Stand Sit to Stand: Min guard           General transfer comment: Assist for safety and verbal cues for hand placement. Incr time to rise    Ambulation/Gait Ambulation/Gait assistance: Min guard Gait Distance  (Feet): 150 Feet Assistive device: Rolling walker (2 wheels), Straight cane Gait Pattern/deviations: Step-through pattern, Decreased dorsiflexion - right Gait velocity: decr Gait velocity interpretation: 1.31 - 2.62 ft/sec, indicative of limited community ambulator   General Gait Details: Assist for safety. Pt with improved stability with use of walker vs cane. Pt with baseline decr control of [name]   Stairs             Wheelchair Mobility    Modified Rankin (Stroke Patients Only)       Balance Overall balance assessment: Needs assistance Sitting-balance support: No upper extremity supported, Feet supported Sitting balance-Leahy Scale: Good     Standing balance support: Single extremity supported, Bilateral upper extremity supported, No upper extremity supported Standing balance-Leahy Scale: Fair                              Cognition Arousal/Alertness: Awake/alert Behavior During Therapy: WFL for tasks assessed/performed Overall Cognitive Status: Within Functional Limits for tasks assessed                                          Exercises      General Comments General comments (skin integrity, edema, etc.): Pt reports history of dizziness x several months. Reports spinning sensation including while lying in bed.      Pertinent Vitals/Pain Pain Assessment Pain Assessment: 0-10 Pain Score: 1  Pain Location: R  flank pain and headache Pain Descriptors / Indicators: Discomfort, Aching Pain Intervention(s): Monitored during session    Home Living                          Prior Function            PT Goals (current goals can now be found in the care plan section) Progress towards PT goals: Progressing toward goals    Frequency    Min 3X/week      PT Plan Current plan remains appropriate;Frequency needs to be updated    Co-evaluation              AM-PAC PT "6 Clicks" Mobility   Outcome Measure  Help  needed turning from your back to your side while in a flat bed without using bedrails?: None Help needed moving from lying on your back to sitting on the side of a flat bed without using bedrails?: None Help needed moving to and from a bed to a chair (including a wheelchair)?: A Little Help needed standing up from a chair using your arms (e.g., wheelchair or bedside chair)?: A Little Help needed to walk in hospital room?: A Little Help needed climbing 3-5 steps with a railing? : A Lot 6 Click Score: 19    End of Session Equipment Utilized During Treatment: Gait belt Activity Tolerance: Patient tolerated treatment well Patient left: in chair;with call bell/phone within reach;with chair alarm set   PT Visit Diagnosis: Other abnormalities of gait and mobility (R26.89);Muscle weakness (generalized) (M62.81);Hemiplegia and hemiparesis Hemiplegia - Right/Left: Right Hemiplegia - dominant/non-dominant: Non-dominant Hemiplegia - caused by: Cerebral infarction (from prior CVA)     Time: 7106-2694 PT Time Calculation (min) (ACUTE ONLY): 18 min  Charges:  $Gait Training: 8-22 mins                     [place] [facility] [place] 05/02/2022, 1:44 PM

## 2022-05-02 NOTE — Progress Notes (Addendum)
Nutrition Brief Note  RD consulted for diet education due to hx of obesity.   Obesity is a complex, chronic medical condition that is optimally managed by a multidisciplinary care team. Weight loss is not an ideal goal for an acute inpatient hospitalization. However, if further work-up for obesity is warranted, consider outpatient referral to outpatient bariatric service and/or [place]'s [facility].   Wt Readings from Last 10 Encounters:  05/02/22 128.9 kg  04/20/22 120.2 kg  03/15/22 119.4 kg  03/13/22 122 kg  03/12/22 121.7 kg  01/09/22 108.9 kg  12/28/21 115.1 kg  12/27/21 114.8 kg  12/04/21 127 kg  11/28/21 125.9 kg   Current diet order is Carb Modified, patient is consuming approximately 100% of meals at this time. Labs and medications reviewed.   No acute nutrition interventions warranted at this time. If acute nutrition issues arise, please consult RD.   Ranell Patrick, RD, LDN Clinical Dietitian RD pager # available in [place]  After hours/weekend pager # available in [facility]

## 2022-05-02 NOTE — TOC Initial Note (Signed)
Transition of Care [facility]) - Initial/Assessment Note    Patient Details  Name: Robert [name] MRN: [id number] Date of Birth: 03-04-1961  Transition of Care [facility]) CM/SW Contact:    Pollie Friar, RN Phone Number: 05/02/2022, 2:39 PM  Clinical Narrative:                 Patient is from home with his spouse that works during the daytime. Pt has been driving self for short trips. He manages his own medications and denies any issues.  Pt has recommendations for home health services. Pt has used [facility] in the past and would like to use their PT again. He states he doesn't want any OT even if recommended. Cory with [facility] accepted the referral.  TOC following.  Expected Discharge Plan: [place] Barriers to Discharge: Continued Medical Work up   Patient Goals and CMS Choice   CMS Medicare.gov Compare Post Acute Care list provided to:: Patient Choice offered to / list presented to : Patient  Expected Discharge Plan and Services Expected Discharge Plan: [place]   Discharge Planning Services: CM Consult Post Acute Care Choice: [place] arrangements for the past 2 months: [place]: PT [place]: [place] Date [place]: 05/02/22   Representative spoke with at [place]: Tommi Rumps  Prior Living Arrangements/Services Living arrangements for the past 2 months: [place] Lives with:: Spouse Patient language and need for interpreter reviewed:: Yes Do you feel safe going back to the place where you live?: Yes      Need for Family Participation in Patient Care: Yes (Comment)   Current home services: DME (walker/ cane/ shower seat/ bars) Criminal Activity/Legal Involvement Pertinent to Current Situation/Hospitalization: No - Comment as needed  Activities of Daily Living Home Assistive Devices/Equipment: Environmental consultant (specify type), Cane (specify quad or  straight) ADL Screening (condition at time of admission) Patient's cognitive ability adequate to safely complete daily activities?: Yes Is the patient deaf or have difficulty hearing?: No Does the patient have difficulty seeing, even when wearing glasses/contacts?: No Does the patient have difficulty concentrating, remembering, or making decisions?: No Patient able to express need for assistance with ADLs?: No Does the patient have difficulty dressing or bathing?: No Independently performs ADLs?: Yes (appropriate for developmental age) Does the patient have difficulty walking or climbing stairs?: Yes Weakness of Legs: Both Weakness of Arms/Hands: None  Permission Sought/Granted                  Emotional Assessment Appearance:: Appears stated age Attitude/Demeanor/Rapport: Engaged Affect (typically observed): Accepting Orientation: : Oriented to Self, Oriented to  Time, Oriented to Place, Oriented to Situation   Psych Involvement: No (comment)  Admission diagnosis:  Pyelonephritis [A21] Complicated UTI (urinary tract infection) [N39.0] Patient Active Problem List   Diagnosis Date Noted   TIA (transient ischemic attack) 05/01/2022   Pyelonephritis 04/30/2022   History of ESBL Klebsiella pneumoniae infection 04/30/2022   DNR (do not resuscitate)/DNI(Do Not Intubate) 04/30/2022   Mild vascular neurocognitive disorder 02/08/2022   Atrial fibrillation 11/29/2021   Acute cystitis without hematuria 11/09/2021   Hyponatremia 11/09/2021   Cerebrovascular accident (CVA) due to embolism of precerebral artery    History of COVID-19 2023   S/P AVR (aortic valve replacement) 10/05/2021  Exertional dyspnea    Combined arterial insufficiency and corporo-venous occlusive erectile dysfunction 12/28/2019   OAB (overactive bladder) 12/28/2019   Inguinal hernia without obstruction or gangrene 06/30/2017   Tremor of both hands 06/29/2017   Hydrocele, acquired 04/07/2017   Scrotal edema  03/14/2017   Diabetic neuropathy 03/01/2017   Kidney transplanted 02/26/2017   Immunosuppressed status 02/26/2017   Diverticulosis of colon without hemorrhage    Morbid obesity ([place]) 02/03/2016   Type 2 diabetes mellitus with hyperglycemia, with long-term current use of insulin 07/14/2015   Chronic renal failure    Anemia in chronic kidney disease 03/30/2015   Benign essential hypertension 07/30/2014   Stage 3b chronic kidney disease (CKD) 03/22/2014   Fatigue 01/08/2014   History of unilateral nephrectomy 09/06/2013   SVT (supraventricular tachycardia) ([place]) 09/06/2013   Lower extremity edema 09/06/2013   Obstructive sleep apnea 09/06/2013   Hypothyroidism 09/06/2013   Hyperlipidemia 09/06/2013   PCP:  Haywood Pao, MD Pharmacy:   [id number] [place], [place] - [address] [place] [address]Phone: [phone]: [phone]    Social Determinants of Health (SDOH) Interventions    Readmission Risk Interventions    11/10/2021   12:06 PM  Readmission Risk Prevention Plan  Transportation Screening Complete  Medication Review (RN Care Manager) Referral to Pharmacy  PCP or Specialist appointment within 3-5 days of discharge Not Complete  PCP/Specialist Appt Not Complete comments Patient not ready for discharge  [place]  HRI or Home Care Consult Pt Refusal Comments possible SNF placement  SW Recovery Care/Counseling Consult Complete  Palliative Care Screening Not Applicable  Skilled Nursing Facility Not Complete  SNF Comments pending PT/ OT orders

## 2022-05-02 NOTE — Progress Notes (Signed)
[facility] PROGRESS NOTE    Progress Note  Robert [name]  JQB:[id number] DOB: 02-May-1961 DOA: 04/30/2022 PCP: Haywood Pao, MD     Brief Narrative:   Robert [name] is an 61 y.o. male past medical history of renal transplant, diabetes mellitus type 2 presents with flank pain dysuria and hematuria admitted for acute pyelonephritis, on 05/01/2022 developed bilateral facial numbness and right arm numbness and weakness code stroke was called.    Assessment/Plan:   Acute Pyelonephritis With history of Klebsiella ESBL. Started empirically on IV meropenem, urine culture grew more than 100,000 colonies of gram-negative rods. Blood cultures remain negative till date.  New right arm weakness/numbness and bilateral facial numbness: Sudden onset concern for TIA versus stroke. He has had 2 CT scan which showed old lacunar infarcts no evidence of acute infarction. MRI of the brain showed no acute intracranial abnormalities but microvascular ischemic changes and remote lacunar infarcts. MRA of the head showed grossly negative MRI no large vessel occlusion.  Some irregularity in the left ICA terminus which is favor artifact Neurology was consulted who recommended a stroke work-up.  Holding anticoagulation until MRI is performed which is negative. HgbA1c, fasting lipid panel HDL greater than 40 LDL less than 60 Start patient on ASA '81mg'$  daily. Atorvastatin 80  Physical therapy evaluated the patient recommended home with PT, no PT follow-up. Telemetry monitoring Management per neurology.  He is complaining of dizziness that sound like vertigo, which she has been having since December, will start him on Antivert as needed, he will relates he would like to talk to the neurologist about this.  Chronic atrial fibrillation: Rate controlled on Coreg apixaban held on admission he was continued to be on hold due to suspected CVA. We will discuss with neurology when to start  Eliquis.  Essential hypertension: Antihypertensive medications were held on admission, except for Coreg was held due to suspected stroke.  History of bioprosthetic aortic valve: Noted.  History of renal transplant currently on immunosuppressive therapy. Currently on CellCept and prednisone continue current regimen.  Chronic kidney disease stage IIIb/renal transplant: Noted stable.  Diabetes mellitus type 2 with hyperglycemia with long-term use of insulin/diabetic neuropathy: Continue CBGs before meals and at bedtime, he is on steroids.  His blood glucose currently is stable. Continue long-acting insulin plus sliding scale.  Morbid obesity: Noted, consult dietitian.  Hypothyroidism: Continue Synthroid.  Struct of sleep apnea: Continue CPAP.    DVT prophylaxis: lovenox Family Communication:none Status is: Inpatient Remains inpatient appropriate because: Acute pyelonephritis    Code Status:     Code Status Orders  (From admission, onward)           Start     Ordered   05/01/22 0131  Do not attempt resuscitation (DNR)  Continuous       Question Answer Comment  In the event of cardiac or respiratory ARREST Do not call a "code blue"   In the event of cardiac or respiratory ARREST Do not perform Intubation, CPR, defibrillation or ACLS   In the event of cardiac or respiratory ARREST Use medication by any route, position, wound care, and other measures to relive pain and suffering. May use oxygen, suction and manual treatment of airway obstruction as needed for comfort.   Comments verified with patient.      05/01/22 0130           Code Status History     Date Active Date Inactive Code Status Order ID Comments User Context  04/30/2022 2320 05/01/2022 0131 DNR [id number]  Kristopher Oppenheim, DO ED   11/09/2021 0740 11/16/2021 2157 Full Code [id number]  Kristopher Oppenheim, DO ED   11/09/2021 0330 11/09/2021 0740 Full Code [id number]  Kristopher Oppenheim, DO ED   10/21/2021 2007 10/26/2021 1836 Full  Code [id number]  Vianne Bulls, MD ED   10/05/2021 1423 10/19/2021 0210 Full Code [id number]  John Giovanni, PA-C Inpatient   08/22/2021 1641 08/23/2021 0137 Full Code [id number]  Troy Sine, MD Inpatient   07/26/2017 2322 07/28/2017 1600 Full Code [id number]  Etta Quill, DO ED   11/23/2016 1105 11/23/2016 1910 Full Code [id number]  Constance Haw, MD Inpatient   09/15/2016 0209 09/16/2016 1632 Full Code [id number]  Rise Patience, MD Inpatient   05/11/2015 0430 05/14/2015 0024 Full Code [id number]  Ivor Costa, MD Inpatient   05/04/2015 1042 05/08/2015 1610 Full Code [id number]  Truett Mainland, DO Inpatient   06/17/2014 1637 06/21/2014 2010 Full Code [id number]  Azzie Roup, MD Inpatient   06/12/2014 1302 06/17/2014 1637 Full Code [id number]  Foye Clock, PA-C Inpatient         IV Access:   Peripheral IV   Procedures and diagnostic studies:   MR BRAIN WO CONTRAST  Result Date: 05/02/2022 CLINICAL DATA:  Follow-up examination for stroke. EXAM: MRI HEAD WITHOUT CONTRAST MRA HEAD WITHOUT CONTRAST MRA NECK WITHOUT CONTRAST TECHNIQUE: Multiplanar, multiecho pulse sequences of the brain and surrounding structures were obtained without intravenous contrast. Angiographic images of the Circle of Willis were obtained using MRA technique without intravenous contrast. Angiographic images of the neck were obtained using MRA technique without intravenous contrast. Carotid stenosis measurements (when applicable) are obtained utilizing NASCET criteria, using the distal internal carotid diameter as the denominator. COMPARISON:  Prior CT from 05/01/2022. FINDINGS: MRI HEAD FINDINGS Brain: Examination degraded by motion artifact. Cerebral volume within normal limits. Patchy T2/FLAIR hyperintensity involving the periventricular and deep white matter both cerebral hemispheres, most consistent with chronic small vessel ischemic disease, mild to moderate in nature. Few scattered superimposed remote  lacunar infarcts noted at the left frontal centrum semi ovale. No evidence for acute or subacute ischemia. Gray-white matter differentiation maintained. No areas of chronic cortical infarction. No acute or chronic intracranial blood products. No mass lesion or midline shift. No hydrocephalus or extra-axial fluid collection. Pituitary gland and suprasellar region normal. Vascular: Major intracranial vascular flow voids are maintained. Skull and upper cervical spine: Craniocervical junction within normal limits. Bone marrow signal intensity normal. No scalp soft tissue abnormality. Sinuses/Orbits: Globes and orbital soft tissues within normal limits. Paranasal sinuses are largely clear. No mastoid effusion. Other: None. MRA HEAD FINDINGS ANTERIOR CIRCULATION: Examination moderately to severely degraded by motion artifact. Visualized distal cervical segments of the internal carotid arteries are widely patent bilaterally. Petrous, cavernous, and supraclinoid segments patent without obvious hemodynamically significant stenosis. Irregularity with possible 8 mm outpouching extending inferiorly from the left ICA terminus (series 1, image 88). While this is favored to be artifactual, a possible aneurysm is difficult to exclude. A1 segments patent. Grossly normal anterior communicating artery complex. Anterior cerebral arteries grossly patent to their distal aspects. No obvious M1 stenosis. No visible proximal MCA branch occlusion. Distal MCA branches grossly perfused and symmetric. POSTERIOR CIRCULATION: Left vertebral artery dominant and patent to the vertebrobasilar junction without stenosis. Left PICA patent. Right vertebral artery hypoplastic and likely terminates in PICA, partially visualized on time-of-flight sequence. Basilar patent to its distal aspect without stenosis. Superior cerebellar arteries patent bilaterally.  Both PCAs are patent and well perfused or distal aspects without appreciable stenosis. MRA NECK  FINDINGS AORTIC ARCH: Examination severely degraded by motion artifact and lack of IV contrast. Aortic arch and origin the great vessels not visualized or assessed on this exam. RIGHT CAROTID SYSTEM: Visualized right common and internal carotid arteries patent with antegrade flow. No obvious hemodynamically significant stenosis about the right carotid bulb. LEFT CAROTID SYSTEM: Visualized left common and internal carotid arteries patent with antegrade flow. No obvious hemodynamically significant stenosis about the left carotid bulb. VERTEBRAL ARTERIES: Neither vertebral artery origin is visualized. Left vertebral artery dominant. Vertebral arteries grossly patent within the neck with antegrade flow. No visible stenosis. IMPRESSION: MRI HEAD IMPRESSION: 1. Motion degraded exam. 2. No acute intracranial abnormality. 3. Mild-to-moderate chronic microvascular ischemic disease with a few scattered remote lacunar infarcts involving the left hemispheric cerebral white matter. MRA HEAD IMPRESSION: 1. Severely motion degraded exam. 2. Grossly negative intracranial MRA. No large vessel occlusion. No obvious hemodynamically significant or correctable stenosis. 3. Irregularity with possible 8 mm outpouching extending inferiorly from the left ICA terminus. While this finding is favored to be artifactual, a possible aneurysm is difficult to exclude. Correlation with dedicated CTA and/or repeat MRA when the patient is able to tolerate the exam suggested for further evaluation. MRA NECK IMPRESSION: 1. Severely motion degraded exam. 2. Grossly patent carotid artery systems and vertebral arteries within the neck. No obvious hemodynamically significant stenosis. 3. Dominant left vertebral artery. Hypoplastic right vertebral artery likely terminates in PICA. Electronically Signed   By: Jeannine Boga M.D.   On: 05/02/2022 05:13   MR ANGIO HEAD WO CONTRAST  Result Date: 05/02/2022 CLINICAL DATA:  Follow-up examination for  stroke. EXAM: MRI HEAD WITHOUT CONTRAST MRA HEAD WITHOUT CONTRAST MRA NECK WITHOUT CONTRAST TECHNIQUE: Multiplanar, multiecho pulse sequences of the brain and surrounding structures were obtained without intravenous contrast. Angiographic images of the Circle of Willis were obtained using MRA technique without intravenous contrast. Angiographic images of the neck were obtained using MRA technique without intravenous contrast. Carotid stenosis measurements (when applicable) are obtained utilizing NASCET criteria, using the distal internal carotid diameter as the denominator. COMPARISON:  Prior CT from 05/01/2022. FINDINGS: MRI HEAD FINDINGS Brain: Examination degraded by motion artifact. Cerebral volume within normal limits. Patchy T2/FLAIR hyperintensity involving the periventricular and deep white matter both cerebral hemispheres, most consistent with chronic small vessel ischemic disease, mild to moderate in nature. Few scattered superimposed remote lacunar infarcts noted at the left frontal centrum semi ovale. No evidence for acute or subacute ischemia. Gray-white matter differentiation maintained. No areas of chronic cortical infarction. No acute or chronic intracranial blood products. No mass lesion or midline shift. No hydrocephalus or extra-axial fluid collection. Pituitary gland and suprasellar region normal. Vascular: Major intracranial vascular flow voids are maintained. Skull and upper cervical spine: Craniocervical junction within normal limits. Bone marrow signal intensity normal. No scalp soft tissue abnormality. Sinuses/Orbits: Globes and orbital soft tissues within normal limits. Paranasal sinuses are largely clear. No mastoid effusion. Other: None. MRA HEAD FINDINGS ANTERIOR CIRCULATION: Examination moderately to severely degraded by motion artifact. Visualized distal cervical segments of the internal carotid arteries are widely patent bilaterally. Petrous, cavernous, and supraclinoid segments  patent without obvious hemodynamically significant stenosis. Irregularity with possible 8 mm outpouching extending inferiorly from the left ICA terminus (series 1, image 88). While this is favored to be artifactual, a possible aneurysm is difficult to exclude. A1 segments patent. Grossly normal anterior communicating artery complex. Anterior cerebral  arteries grossly patent to their distal aspects. No obvious M1 stenosis. No visible proximal MCA branch occlusion. Distal MCA branches grossly perfused and symmetric. POSTERIOR CIRCULATION: Left vertebral artery dominant and patent to the vertebrobasilar junction without stenosis. Left PICA patent. Right vertebral artery hypoplastic and likely terminates in PICA, partially visualized on time-of-flight sequence. Basilar patent to its distal aspect without stenosis. Superior cerebellar arteries patent bilaterally. Both PCAs are patent and well perfused or distal aspects without appreciable stenosis. MRA NECK FINDINGS AORTIC ARCH: Examination severely degraded by motion artifact and lack of IV contrast. Aortic arch and origin the great vessels not visualized or assessed on this exam. RIGHT CAROTID SYSTEM: Visualized right common and internal carotid arteries patent with antegrade flow. No obvious hemodynamically significant stenosis about the right carotid bulb. LEFT CAROTID SYSTEM: Visualized left common and internal carotid arteries patent with antegrade flow. No obvious hemodynamically significant stenosis about the left carotid bulb. VERTEBRAL ARTERIES: Neither vertebral artery origin is visualized. Left vertebral artery dominant. Vertebral arteries grossly patent within the neck with antegrade flow. No visible stenosis. IMPRESSION: MRI HEAD IMPRESSION: 1. Motion degraded exam. 2. No acute intracranial abnormality. 3. Mild-to-moderate chronic microvascular ischemic disease with a few scattered remote lacunar infarcts involving the left hemispheric cerebral white  matter. MRA HEAD IMPRESSION: 1. Severely motion degraded exam. 2. Grossly negative intracranial MRA. No large vessel occlusion. No obvious hemodynamically significant or correctable stenosis. 3. Irregularity with possible 8 mm outpouching extending inferiorly from the left ICA terminus. While this finding is favored to be artifactual, a possible aneurysm is difficult to exclude. Correlation with dedicated CTA and/or repeat MRA when the patient is able to tolerate the exam suggested for further evaluation. MRA NECK IMPRESSION: 1. Severely motion degraded exam. 2. Grossly patent carotid artery systems and vertebral arteries within the neck. No obvious hemodynamically significant stenosis. 3. Dominant left vertebral artery. Hypoplastic right vertebral artery likely terminates in PICA. Electronically Signed   By: Jeannine Boga M.D.   On: 05/02/2022 05:13   MR ANGIO NECK WO CONTRAST  Result Date: 05/02/2022 CLINICAL DATA:  Follow-up examination for stroke. EXAM: MRI HEAD WITHOUT CONTRAST MRA HEAD WITHOUT CONTRAST MRA NECK WITHOUT CONTRAST TECHNIQUE: Multiplanar, multiecho pulse sequences of the brain and surrounding structures were obtained without intravenous contrast. Angiographic images of the Circle of Willis were obtained using MRA technique without intravenous contrast. Angiographic images of the neck were obtained using MRA technique without intravenous contrast. Carotid stenosis measurements (when applicable) are obtained utilizing NASCET criteria, using the distal internal carotid diameter as the denominator. COMPARISON:  Prior CT from 05/01/2022. FINDINGS: MRI HEAD FINDINGS Brain: Examination degraded by motion artifact. Cerebral volume within normal limits. Patchy T2/FLAIR hyperintensity involving the periventricular and deep white matter both cerebral hemispheres, most consistent with chronic small vessel ischemic disease, mild to moderate in nature. Few scattered superimposed remote lacunar  infarcts noted at the left frontal centrum semi ovale. No evidence for acute or subacute ischemia. Gray-white matter differentiation maintained. No areas of chronic cortical infarction. No acute or chronic intracranial blood products. No mass lesion or midline shift. No hydrocephalus or extra-axial fluid collection. Pituitary gland and suprasellar region normal. Vascular: Major intracranial vascular flow voids are maintained. Skull and upper cervical spine: Craniocervical junction within normal limits. Bone marrow signal intensity normal. No scalp soft tissue abnormality. Sinuses/Orbits: Globes and orbital soft tissues within normal limits. Paranasal sinuses are largely clear. No mastoid effusion. Other: None. MRA HEAD FINDINGS ANTERIOR CIRCULATION: Examination moderately to severely degraded by motion  artifact. Visualized distal cervical segments of the internal carotid arteries are widely patent bilaterally. Petrous, cavernous, and supraclinoid segments patent without obvious hemodynamically significant stenosis. Irregularity with possible 8 mm outpouching extending inferiorly from the left ICA terminus (series 1, image 88). While this is favored to be artifactual, a possible aneurysm is difficult to exclude. A1 segments patent. Grossly normal anterior communicating artery complex. Anterior cerebral arteries grossly patent to their distal aspects. No obvious M1 stenosis. No visible proximal MCA branch occlusion. Distal MCA branches grossly perfused and symmetric. POSTERIOR CIRCULATION: Left vertebral artery dominant and patent to the vertebrobasilar junction without stenosis. Left PICA patent. Right vertebral artery hypoplastic and likely terminates in PICA, partially visualized on time-of-flight sequence. Basilar patent to its distal aspect without stenosis. Superior cerebellar arteries patent bilaterally. Both PCAs are patent and well perfused or distal aspects without appreciable stenosis. MRA NECK FINDINGS  AORTIC ARCH: Examination severely degraded by motion artifact and lack of IV contrast. Aortic arch and origin the great vessels not visualized or assessed on this exam. RIGHT CAROTID SYSTEM: Visualized right common and internal carotid arteries patent with antegrade flow. No obvious hemodynamically significant stenosis about the right carotid bulb. LEFT CAROTID SYSTEM: Visualized left common and internal carotid arteries patent with antegrade flow. No obvious hemodynamically significant stenosis about the left carotid bulb. VERTEBRAL ARTERIES: Neither vertebral artery origin is visualized. Left vertebral artery dominant. Vertebral arteries grossly patent within the neck with antegrade flow. No visible stenosis. IMPRESSION: MRI HEAD IMPRESSION: 1. Motion degraded exam. 2. No acute intracranial abnormality. 3. Mild-to-moderate chronic microvascular ischemic disease with a few scattered remote lacunar infarcts involving the left hemispheric cerebral white matter. MRA HEAD IMPRESSION: 1. Severely motion degraded exam. 2. Grossly negative intracranial MRA. No large vessel occlusion. No obvious hemodynamically significant or correctable stenosis. 3. Irregularity with possible 8 mm outpouching extending inferiorly from the left ICA terminus. While this finding is favored to be artifactual, a possible aneurysm is difficult to exclude. Correlation with dedicated CTA and/or repeat MRA when the patient is able to tolerate the exam suggested for further evaluation. MRA NECK IMPRESSION: 1. Severely motion degraded exam. 2. Grossly patent carotid artery systems and vertebral arteries within the neck. No obvious hemodynamically significant stenosis. 3. Dominant left vertebral artery. Hypoplastic right vertebral artery likely terminates in PICA. Electronically Signed   By: Jeannine Boga M.D.   On: 05/02/2022 05:13   ECHOCARDIOGRAM COMPLETE  Result Date: 05/01/2022    ECHOCARDIOGRAM REPORT   Patient Name:   Robert [name] Date of Exam: 05/01/2022 Medical Rec #:  [id number]        Height:       71.0 in Accession #:    [id number]       Weight:       264.6 lb Date of Birth:  March 09, 1961        BSA:          2.375 m Patient Age:    14 years         BP:           99/60 mmHg Patient Gender: M                HR:           62 bpm. Exam Location:  Inpatient Procedure: 2D Echo, Cardiac Doppler and Color Doppler Indications:    Stroke  History:        Patient has prior history of Echocardiogram examinations, most  recent 11/27/2021. TIA, Arrythmias:Atrial Fibrillation and                 Tachycardia, Signs/Symptoms:Edema, Fatigue and Dyspnea; Risk                 Factors:Hypertension, Diabetes and Dyslipidemia. Kidney                 Transplant, S/P AVR with a 62m Edwards inspiris resilia                 prosthetic.  Sonographer:    DWenda LowReferring Phys: [email or web address]: Patient is morbidly obese. IMPRESSIONS  1. Left ventricular ejection fraction, by estimation, is 60 to 65%. The left ventricle has normal function. The left ventricle has no regional wall motion abnormalities. There is moderate left ventricular hypertrophy. Left ventricular diastolic parameters are consistent with Grade II diastolic dysfunction (pseudonormalization).  2. Right ventricular systolic function is normal. The right ventricular size is normal. There is normal pulmonary artery systolic pressure.  3. Left atrial size was moderately dilated.  4. Right atrial size was mild to moderately dilated.  5. No evidence of mitral valve regurgitation. Moderate mitral annular calcification.  6. S/p AVR 27 mm Edwards Inspiris Resilia Prosthesis. No significant PVL. Well seated. AV max 2.3 m/s. Mean gradient 11 mmHg. Normal prosthesis. Aortic valve regurgitation is not visualized.  7. There is mild dilatation of the ascending aorta, measuring 39 mm.  8. The inferior vena cava is normal in size with greater than 50%  respiratory variability, suggesting right atrial pressure of 3 mmHg. Comparison(s): No significant change from prior study. FINDINGS  Left Ventricle: Left ventricular ejection fraction, by estimation, is 60 to 65%. The left ventricle has normal function. The left ventricle has no regional wall motion abnormalities. The left ventricular internal cavity size was normal in size. There is  moderate left ventricular hypertrophy. Abnormal (paradoxical) septal motion consistent with post-operative status. Left ventricular diastolic parameters are consistent with Grade II diastolic dysfunction (pseudonormalization). Right Ventricle: The right ventricular size is normal. Right vetricular wall thickness was not well visualized. Right ventricular systolic function is normal. There is normal pulmonary artery systolic pressure. The tricuspid regurgitant velocity is 2.62 m/s, and with an assumed right atrial pressure of 3 mmHg, the estimated right ventricular systolic pressure is 383.1mmHg. Left Atrium: Left atrial size was moderately dilated. Right Atrium: Right atrial size was mild to moderately dilated. Pericardium: There is no evidence of pericardial effusion. Mitral Valve: Moderate mitral annular calcification. No evidence of mitral valve regurgitation. MV peak gradient, 9.2 mmHg. The mean mitral valve gradient is 4.0 mmHg. Tricuspid Valve: The tricuspid valve is normal in structure. Tricuspid valve regurgitation is trivial. Aortic Valve: S/p AVR 27 mm Edwards Inspiris Resilia Prosthesis. No significant PVL. Well seated. AV max 2.3 m/s. Mean gradient 11 mmHg. Normal prosthesis. Aortic valve regurgitation is not visualized. Aortic valve mean gradient measures 11.0 mmHg. Aortic valve peak gradient measures 22.2 mmHg. Aortic valve area, by VTI measures 2.73 cm. Pulmonic Valve: The pulmonic valve was not well visualized. Pulmonic valve regurgitation is not visualized. Aorta: There is mild dilatation of the ascending aorta,  measuring 39 mm. Venous: The inferior vena cava is normal in size with greater than 50% respiratory variability, suggesting right atrial pressure of 3 mmHg.  LEFT VENTRICLE PLAX 2D LVIDd:         5.05 cm     Diastology LVIDs:  3.20 cm     LV e' medial:    5.55 cm/s LV PW:         1.65 cm     LV E/e' medial:  24.5 LV IVS:        1.55 cm     LV e' lateral:   8.38 cm/s LVOT diam:     2.10 cm     LV E/e' lateral: 16.2 LV SV:         137 LV SV Index:   58 LVOT Area:     3.46 cm  LV Volumes (MOD) LV vol d, MOD A2C: 59.5 ml LV vol d, MOD A4C: 83.5 ml LV vol s, MOD A2C: 13.9 ml LV vol s, MOD A4C: 21.4 ml LV SV MOD A2C:     45.6 ml LV SV MOD A4C:     83.5 ml LV SV MOD BP:      53.3 ml RIGHT VENTRICLE RV Basal diam:  5.60 cm RV Mid diam:    4.30 cm RV S prime:     14.10 cm/s TAPSE (M-mode): 3.2 cm LEFT ATRIUM              Index        RIGHT ATRIUM           Index LA diam:        4.90 cm  2.06 cm/m   RA Area:     32.00 cm LA Vol (A2C):   135.0 ml 56.85 ml/m  RA Volume:   126.00 ml 53.06 ml/m LA Vol (A4C):   131.0 ml 55.16 ml/m LA Biplane Vol: 135.0 ml 56.85 ml/m  AORTIC VALVE                     PULMONIC VALVE AV Area (Vmax):    2.60 cm      PV Vmax:       0.93 m/s AV Area (Vmean):   2.51 cm      PV Peak grad:  3.5 mmHg AV Area (VTI):     2.73 cm AV Vmax:           235.50 cm/s AV Vmean:          149.000 cm/s AV VTI:            0.500 m AV Peak Grad:      22.2 mmHg AV Mean Grad:      11.0 mmHg LVOT Vmax:         177.00 cm/s LVOT Vmean:        108.000 cm/s LVOT VTI:          0.395 m LVOT/AV VTI ratio: 0.79  AORTA Ao Root diam: 3.80 cm Ao Asc diam:  3.90 cm MITRAL VALVE                TRICUSPID VALVE MV Area (PHT): 2.97 cm     TR Peak grad:   27.5 mmHg MV Area VTI:   2.26 cm     TR Vmax:        262.00 cm/s MV Peak grad:  9.2 mmHg MV Mean grad:  4.0 mmHg     SHUNTS MV Vmax:       1.52 m/s     Systemic VTI:  0.40 m MV Vmean:      90.7 cm/s    Systemic Diam: 2.10 cm MV Decel Time: 255 msec MV E velocity: 136.00  cm/s MV A velocity: 133.00 cm/s MV  E/A ratio:  1.02 Phineas Inches Electronically signed by Phineas Inches Signature Date/Time: 05/01/2022/4:15:36 PM    Final    CT HEAD CODE STROKE WO CONTRAST  Result Date: 05/01/2022 CLINICAL DATA:  Code stroke. Neuro deficit, acute, stroke suspected. EXAM: CT HEAD WITHOUT CONTRAST TECHNIQUE: Contiguous axial images were obtained from the base of the skull through the vertex without intravenous contrast. RADIATION DOSE REDUCTION: This exam was performed according to the departmental dose-optimization program which includes automated exposure control, adjustment of the mA and/or kV according to patient size and/or use of iterative reconstruction technique. COMPARISON:  Head CT May 01, 2022 at 12:25 a.m. FINDINGS: Brain: No evidence of acute infarction, hemorrhage, hydrocephalus, extra-axial collection or mass lesion/mass effect. Remote lacunar infarcts in the left centrum semiovale and deep white matter of the left frontal lobe. Vascular: Calcified plaques in the bilateral vertebral arteries and carotid siphons. Skull: Normal. Negative for fracture or focal lesion. Sinuses/Orbits: No acute finding. Other: None. ASPECTS [facility] Stroke Program Early CT Score) - Ganglionic level infarction (caudate, lentiform nuclei, internal capsule, insula, M1-M3 cortex): 7 - Supraganglionic infarction (M4-M6 cortex): 3 Total score (0-10 with 10 being normal): 10 IMPRESSION: 1. No acute intracranial abnormality. No significant change from prior CT. 2. ASPECTS is 10. These results were transmitted via [employer] page system at the time of interpretation on 05/01/2022 at 10:46 am to provider Dr. Leonel Ramsay. Electronically Signed   By: Pedro Earls M.D.   On: 05/01/2022 10:47   CT Head Wo Contrast  Result Date: 05/01/2022 CLINICAL DATA:  Neuro deficit, prior stroke EXAM: CT HEAD WITHOUT CONTRAST TECHNIQUE: Contiguous axial images were obtained from the base of the skull through the  vertex without intravenous contrast. RADIATION DOSE REDUCTION: This exam was performed according to the departmental dose-optimization program which includes automated exposure control, adjustment of the mA and/or kV according to patient size and/or use of iterative reconstruction technique. COMPARISON:  11/24/2021 FINDINGS: Brain: No evidence of acute infarction, hemorrhage, hydrocephalus, extra-axial collection or mass lesion/mass effect. Old lacunar infarcts in the subcortical left frontal lobe. Vascular: Intracranial atherosclerosis. Skull: Normal. Negative for fracture or focal lesion. Sinuses/Orbits: The visualized paranasal sinuses are essentially clear. The mastoid air cells are unopacified. Other: None. IMPRESSION: No acute intracranial abnormality. Old lacunar infarcts in the subcortical left frontal lobe. Electronically Signed   By: Julian Hy M.D.   On: 05/01/2022 00:43   CT Renal Stone Study  Result Date: 04/30/2022 CLINICAL DATA:  Flank pain, hematuria EXAM: CT ABDOMEN AND PELVIS WITHOUT CONTRAST TECHNIQUE: Multidetector CT imaging of the abdomen and pelvis was performed following the standard protocol without IV contrast. RADIATION DOSE REDUCTION: This exam was performed according to the departmental dose-optimization program which includes automated exposure control, adjustment of the mA and/or kV according to patient size and/or use of iterative reconstruction technique. COMPARISON:  11/09/2021 FINDINGS: Lower chest: Lung bases are clear. Hepatobiliary: Unenhanced liver is unremarkable. Layering gallbladder sludge. No intrahepatic or extrahepatic duct dilatation. Pancreas: Within normal limits. Spleen: Within normal limits. Adrenals/Urinary Tract: Adrenal glands are within normal limits. Severe atrophy of the native left kidney. Mild atrophy of the native right kidney. No renal calculi or hydronephrosis. Right lower quadrant renal transplant with gas in right renal collecting system. No  hydronephrosis. Nondependent gas in the bladder. Stomach/Bowel: Stomach is within normal limits. No evidence of bowel obstruction. Normal appendix (series 3/image 54). Mild left colonic diverticulosis, without evidence of diverticulitis. Vascular/Lymphatic: No evidence of abdominal aortic aneurysm. Atherosclerotic calcifications of the  abdominal aorta and branch vessels. No suspicious abdominopelvic lymphadenopathy. Reproductive: Prostate is unremarkable. Other: Small fat containing left inguinal hernia (series 3/image 85). Musculoskeletal: Degenerative changes of the visualized thoracolumbar spine. IMPRESSION: Right lower quadrant renal transplant with gas in the right renal collecting system and bladder. Correlate for recent intervention/catheterization. Otherwise, this appearance suggests cystitis with ascending infection. No renal, ureteral, or bladder calculi. No hydronephrosis. Additional ancillary findings as above. Electronically Signed   By: Julian Hy M.D.   On: 04/30/2022 20:02     Medical Consultants:   None.   Subjective:    Robert [name] complaining of dizziness, when he turns his head.  Objective:    Vitals:   05/01/22 2300 05/02/22 0150 05/02/22 0500 05/02/22 0503  BP:  (!) 141/69  (!) 148/85  Pulse: 66 65  62  Resp: '18 18  20  '$ Temp:  97.6 F (36.4 C)  97.9 F (36.6 C)  TempSrc:  Oral  Oral  SpO2: 96% 99%  100%  Weight:   128.9 kg   Height:       SpO2: 100 %   Intake/Output Summary (Last 24 hours) at 05/02/2022 0836 Last data filed at 05/02/2022 0109 Gross per 24 hour  Intake 120 ml  Output 2150 ml  Net -2030 ml   Filed Weights   04/30/22 1242 05/02/22 0500  Weight: 120 kg 128.9 kg    Exam: General exam: In no acute distress. Respiratory system: Good air movement and clear to auscultation. Cardiovascular system: S1 & S2 heard, RRR. No JVD. Gastrointestinal system: Abdomen is nondistended, soft and nontender.  Extremities: No pedal edema. Skin:  No rashes, lesions or ulcers Psychiatry: Judgement and insight appear normal. Mood & affect appropriate.    Data Reviewed:    Labs: Basic Metabolic Panel: Recent Labs  Lab 04/30/22 1414 05/01/22 0354  NA 138 137  K 3.5 3.4*  CL 103 103  CO2 25 25  GLUCOSE 81 119*  BUN 38* 41*  CREATININE 2.21* 2.16*  CALCIUM 10.4* 9.6  MG  --  2.3   GFR Estimated Creatinine Clearance: 48.5 mL/min (A) (by C-G formula based on SCr of 2.16 mg/dL (H)). Liver Function Tests: Recent Labs  Lab 05/01/22 0354  AST 15  ALT 13  ALKPHOS 59  BILITOT 0.5  PROT 5.8*  ALBUMIN 3.5   No results for input(s): "LIPASE", "AMYLASE" in the last 168 hours. No results for input(s): "AMMONIA" in the last 168 hours. Coagulation profile No results for input(s): "INR", "PROTIME" in the last 168 hours. COVID-19 Labs  No results for input(s): "DDIMER", "FERRITIN", "LDH", "CRP" in the last 72 hours.  Lab Results  Component Value Date   SARSCOV2NAA POSITIVE (A) 11/09/2021   SARSCOV2NAA NEGATIVE 10/25/2021   [place] NEGATIVE 10/21/2021   [place] NEGATIVE 10/18/2021    CBC: Recent Labs  Lab 04/30/22 1414 05/01/22 0354  WBC 8.4 7.5  NEUTROABS  --  5.0  HGB 12.7* 11.9*  HCT 39.3 36.4*  MCV 87.1 86.1  PLT 247 222   Cardiac Enzymes: No results for input(s): "CKTOTAL", "CKMB", "CKMBINDEX", "TROPONINI" in the last 168 hours. BNP (last 3 results) No results for input(s): "PROBNP" in the last 8760 hours. CBG: Recent Labs  Lab 05/01/22 1749 05/01/22 2147 05/01/22 2150 05/01/22 2226 05/02/22 0608  GLUCAP 74 62* 63* 91 104*   D-Dimer: No results for input(s): "DDIMER" in the last 72 hours. Hgb A1c: No results for input(s): "HGBA1C" in the last 72 hours. Lipid Profile: Recent Labs  05/02/22 0334  CHOL 116  HDL 52  LDLCALC 55  TRIG 45  CHOLHDL 2.2   Thyroid function studies: No results for input(s): "TSH", "T4TOTAL", "T3FREE", "THYROIDAB" in the last 72 hours.  Invalid  input(s): "FREET3" Anemia work up: No results for input(s): "VITAMINB12", "FOLATE", "FERRITIN", "TIBC", "IRON", "RETICCTPCT" in the last 72 hours. Sepsis Labs: Recent Labs  Lab 04/30/22 1414 04/30/22 2253 05/01/22 0354  WBC 8.4  --  7.5  LATICACIDVEN  --  1.1 0.8   Microbiology Recent Results (from the past 240 hour(s))  Urine Culture     Status: Abnormal (Preliminary result)   Collection Time: 04/30/22 12:43 PM   Specimen: Urine, Clean Catch  Result Value Ref Range Status   Specimen Description URINE, CLEAN CATCH  Final   Special Requests NONE  Final   Culture (A)  Final    >=100,000 COLONIES/mL GRAM NEGATIVE RODS IDENTIFICATION AND SUSCEPTIBILITIES TO FOLLOW Performed at [facility], [address] [place], [place] [address]    Report Status PENDING  Incomplete  Blood culture (routine x 2)     Status: None (Preliminary result)   Collection Time: 04/30/22 10:36 PM   Specimen: BLOOD  Result Value Ref Range Status   Specimen Description BLOOD LEFT ANTECUBITAL  Final   Special Requests   Final    BOTTLES DRAWN AEROBIC AND ANAEROBIC Blood Culture adequate volume   Culture   Final    NO GROWTH 2 DAYS Performed at [facility], [place] [address] [place], [place] [address]    Report Status PENDING  Incomplete  Blood culture (routine x 2)     Status: None (Preliminary result)   Collection Time: 05/01/22  3:54 AM   Specimen: BLOOD LEFT HAND  Result Value Ref Range Status   Specimen Description BLOOD LEFT HAND  Final   Special Requests   Final    BOTTLES DRAWN AEROBIC AND ANAEROBIC Blood Culture results may not be optimal due to an excessive volume of blood received in culture bottles   Culture   Final    NO GROWTH 1 DAY Performed at [facility], [place] [address] [place], [place] [address]    Report Status PENDING  Incomplete     Medications:     stroke: early stages of recovery book   Does not apply Once   aspirin  300 mg Rectal Daily   Or   aspirin   325 mg Oral Daily   carvedilol  25 mg Oral BID WC   levothyroxine  112 mcg Oral Q0600   mycophenolate  1,000 mg Oral BID   povidone-iodine   Topical BID   predniSONE  5 mg Oral Q breakfast   rosuvastatin  20 mg Oral Daily   Continuous Infusions:  sodium chloride 100 mL/hr at 05/01/22 2133   meropenem (MERREM) IV 1 g (05/01/22 2138)      LOS: 2 days   Charlynne Cousins  [facility]  05/02/2022, 8:36 AM

## 2022-05-02 NOTE — Progress Notes (Signed)
STROKE TEAM PROGRESS NOTE   SUBJECTIVE (INTERVAL HISTORY) His PT/OT is at the bedside.  Patient walked with PT/OT in the hallway using walker, doing well, later he sat in chair after working with PT/OT.  He stated that he had chronic dizziness spells at home, once every other day, mostly happened during sitting position, lasting 5 to 10 minutes and resolved.  Never happened during motion.  Close eyes feels better during the episode.  Dizziness was not typically associate with headache.    Yesterday again he had dizziness/vertigo while lying in bed, had a movement does not have much  effect on dizziness, but this time associate with mild headache.  Currently dizziness resolved.  Patient also complaining of whole face tingling, right arm funny feeling, which has been persistent since yesterday, no change.  Overnight CT/MRI/MRA head and neck all negative.  OBJECTIVE Temp:  [97.6 F (36.4 C)-98.5 F (36.9 C)] 98.3 F (36.8 C) (07/26 1500) Pulse Rate:  [62-78] 78 (07/26 1500) Cardiac Rhythm: Normal sinus rhythm (07/26 0753) Resp:  [18-20] 18 (07/26 1500) BP: (141-159)/(58-85) 154/62 (07/26 1500) SpO2:  [96 %-100 %] 96 % (07/26 1500) Weight:  [128.9 kg] 128.9 kg (07/26 0500)  Recent Labs  Lab 05/01/22 2147 05/01/22 2150 05/01/22 2226 05/02/22 0608 05/02/22 1144  GLUCAP 62* 63* 91 104* 148*   Recent Labs  Lab 04/30/22 1414 05/01/22 0354  NA 138 137  K 3.5 3.4*  CL 103 103  CO2 25 25  GLUCOSE 81 119*  BUN 38* 41*  CREATININE 2.21* 2.16*  CALCIUM 10.4* 9.6  MG  --  2.3   Recent Labs  Lab 05/01/22 0354  AST 15  ALT 13  ALKPHOS 59  BILITOT 0.5  PROT 5.8*  ALBUMIN 3.5   Recent Labs  Lab 04/30/22 1414 05/01/22 0354 05/02/22 0947  WBC 8.4 7.5 7.6  NEUTROABS  --  5.0  --   HGB 12.7* 11.9* 11.8*  HCT 39.3 36.4* 36.0*  MCV 87.1 86.1 86.3  PLT 247 222 213   No results for input(s): "CKTOTAL", "CKMB", "CKMBINDEX", "TROPONINI" in the last 168 hours. Recent Labs     05/02/22 0947  LABPROT 13.6  INR 1.1   Recent Labs    04/30/22 1243  COLORURINE YELLOW  LABSPEC 1.010  PHURINE 6.0  GLUCOSEU NEGATIVE  HGBUR MODERATE*  BILIRUBINUR NEGATIVE  KETONESUR NEGATIVE  PROTEINUR 100*  NITRITE NEGATIVE  LEUKOCYTESUR SMALL*       Component Value Date/Time   CHOL 116 05/02/2022 0334   TRIG 45 05/02/2022 0334   HDL 52 05/02/2022 0334   CHOLHDL 2.2 05/02/2022 0334   VLDL 9 05/02/2022 0334   LDLCALC 55 05/02/2022 0334   Lab Results  Component Value Date   HGBA1C 7.4 (H) 05/02/2022   No results found for: "LABOPIA", "COCAINSCRNUR", "LABBENZ", "AMPHETMU", "THCU", "LABBARB"  No results for input(s): "ETH" in the last 168 hours.  I have personally reviewed the radiological images below and agree with the radiology interpretations.  MR BRAIN WO CONTRAST  Result Date: 05/02/2022 CLINICAL DATA:  Follow-up examination for stroke. EXAM: MRI HEAD WITHOUT CONTRAST MRA HEAD WITHOUT CONTRAST MRA NECK WITHOUT CONTRAST TECHNIQUE: Multiplanar, multiecho pulse sequences of the brain and surrounding structures were obtained without intravenous contrast. Angiographic images of the Circle of Willis were obtained using MRA technique without intravenous contrast. Angiographic images of the neck were obtained using MRA technique without intravenous contrast. Carotid stenosis measurements (when applicable) are obtained utilizing NASCET criteria, using the distal internal carotid  diameter as the denominator. COMPARISON:  Prior CT from 05/01/2022. FINDINGS: MRI HEAD FINDINGS Brain: Examination degraded by motion artifact. Cerebral volume within normal limits. Patchy T2/FLAIR hyperintensity involving the periventricular and deep white matter both cerebral hemispheres, most consistent with chronic small vessel ischemic disease, mild to moderate in nature. Few scattered superimposed remote lacunar infarcts noted at the left frontal centrum semi ovale. No evidence for acute or subacute  ischemia. Gray-white matter differentiation maintained. No areas of chronic cortical infarction. No acute or chronic intracranial blood products. No mass lesion or midline shift. No hydrocephalus or extra-axial fluid collection. Pituitary gland and suprasellar region normal. Vascular: Major intracranial vascular flow voids are maintained. Skull and upper cervical spine: Craniocervical junction within normal limits. Bone marrow signal intensity normal. No scalp soft tissue abnormality. Sinuses/Orbits: Globes and orbital soft tissues within normal limits. Paranasal sinuses are largely clear. No mastoid effusion. Other: None. MRA HEAD FINDINGS ANTERIOR CIRCULATION: Examination moderately to severely degraded by motion artifact. Visualized distal cervical segments of the internal carotid arteries are widely patent bilaterally. Petrous, cavernous, and supraclinoid segments patent without obvious hemodynamically significant stenosis. Irregularity with possible 8 mm outpouching extending inferiorly from the left ICA terminus (series 1, image 88). While this is favored to be artifactual, a possible aneurysm is difficult to exclude. A1 segments patent. Grossly normal anterior communicating artery complex. Anterior cerebral arteries grossly patent to their distal aspects. No obvious M1 stenosis. No visible proximal MCA branch occlusion. Distal MCA branches grossly perfused and symmetric. POSTERIOR CIRCULATION: Left vertebral artery dominant and patent to the vertebrobasilar junction without stenosis. Left PICA patent. Right vertebral artery hypoplastic and likely terminates in PICA, partially visualized on time-of-flight sequence. Basilar patent to its distal aspect without stenosis. Superior cerebellar arteries patent bilaterally. Both PCAs are patent and well perfused or distal aspects without appreciable stenosis. MRA NECK FINDINGS AORTIC ARCH: Examination severely degraded by motion artifact and lack of IV contrast.  Aortic arch and origin the great vessels not visualized or assessed on this exam. RIGHT CAROTID SYSTEM: Visualized right common and internal carotid arteries patent with antegrade flow. No obvious hemodynamically significant stenosis about the right carotid bulb. LEFT CAROTID SYSTEM: Visualized left common and internal carotid arteries patent with antegrade flow. No obvious hemodynamically significant stenosis about the left carotid bulb. VERTEBRAL ARTERIES: Neither vertebral artery origin is visualized. Left vertebral artery dominant. Vertebral arteries grossly patent within the neck with antegrade flow. No visible stenosis. IMPRESSION: MRI HEAD IMPRESSION: 1. Motion degraded exam. 2. No acute intracranial abnormality. 3. Mild-to-moderate chronic microvascular ischemic disease with a few scattered remote lacunar infarcts involving the left hemispheric cerebral white matter. MRA HEAD IMPRESSION: 1. Severely motion degraded exam. 2. Grossly negative intracranial MRA. No large vessel occlusion. No obvious hemodynamically significant or correctable stenosis. 3. Irregularity with possible 8 mm outpouching extending inferiorly from the left ICA terminus. While this finding is favored to be artifactual, a possible aneurysm is difficult to exclude. Correlation with dedicated CTA and/or repeat MRA when the patient is able to tolerate the exam suggested for further evaluation. MRA NECK IMPRESSION: 1. Severely motion degraded exam. 2. Grossly patent carotid artery systems and vertebral arteries within the neck. No obvious hemodynamically significant stenosis. 3. Dominant left vertebral artery. Hypoplastic right vertebral artery likely terminates in PICA. Electronically Signed   By: Jeannine Boga M.D.   On: 05/02/2022 05:13   MR ANGIO HEAD WO CONTRAST  Result Date: 05/02/2022 CLINICAL DATA:  Follow-up examination for stroke. EXAM: MRI HEAD WITHOUT CONTRAST  MRA HEAD WITHOUT CONTRAST MRA NECK WITHOUT CONTRAST  TECHNIQUE: Multiplanar, multiecho pulse sequences of the brain and surrounding structures were obtained without intravenous contrast. Angiographic images of the Circle of Willis were obtained using MRA technique without intravenous contrast. Angiographic images of the neck were obtained using MRA technique without intravenous contrast. Carotid stenosis measurements (when applicable) are obtained utilizing NASCET criteria, using the distal internal carotid diameter as the denominator. COMPARISON:  Prior CT from 05/01/2022. FINDINGS: MRI HEAD FINDINGS Brain: Examination degraded by motion artifact. Cerebral volume within normal limits. Patchy T2/FLAIR hyperintensity involving the periventricular and deep white matter both cerebral hemispheres, most consistent with chronic small vessel ischemic disease, mild to moderate in nature. Few scattered superimposed remote lacunar infarcts noted at the left frontal centrum semi ovale. No evidence for acute or subacute ischemia. Gray-white matter differentiation maintained. No areas of chronic cortical infarction. No acute or chronic intracranial blood products. No mass lesion or midline shift. No hydrocephalus or extra-axial fluid collection. Pituitary gland and suprasellar region normal. Vascular: Major intracranial vascular flow voids are maintained. Skull and upper cervical spine: Craniocervical junction within normal limits. Bone marrow signal intensity normal. No scalp soft tissue abnormality. Sinuses/Orbits: Globes and orbital soft tissues within normal limits. Paranasal sinuses are largely clear. No mastoid effusion. Other: None. MRA HEAD FINDINGS ANTERIOR CIRCULATION: Examination moderately to severely degraded by motion artifact. Visualized distal cervical segments of the internal carotid arteries are widely patent bilaterally. Petrous, cavernous, and supraclinoid segments patent without obvious hemodynamically significant stenosis. Irregularity with possible 8 mm  outpouching extending inferiorly from the left ICA terminus (series 1, image 88). While this is favored to be artifactual, a possible aneurysm is difficult to exclude. A1 segments patent. Grossly normal anterior communicating artery complex. Anterior cerebral arteries grossly patent to their distal aspects. No obvious M1 stenosis. No visible proximal MCA branch occlusion. Distal MCA branches grossly perfused and symmetric. POSTERIOR CIRCULATION: Left vertebral artery dominant and patent to the vertebrobasilar junction without stenosis. Left PICA patent. Right vertebral artery hypoplastic and likely terminates in PICA, partially visualized on time-of-flight sequence. Basilar patent to its distal aspect without stenosis. Superior cerebellar arteries patent bilaterally. Both PCAs are patent and well perfused or distal aspects without appreciable stenosis. MRA NECK FINDINGS AORTIC ARCH: Examination severely degraded by motion artifact and lack of IV contrast. Aortic arch and origin the great vessels not visualized or assessed on this exam. RIGHT CAROTID SYSTEM: Visualized right common and internal carotid arteries patent with antegrade flow. No obvious hemodynamically significant stenosis about the right carotid bulb. LEFT CAROTID SYSTEM: Visualized left common and internal carotid arteries patent with antegrade flow. No obvious hemodynamically significant stenosis about the left carotid bulb. VERTEBRAL ARTERIES: Neither vertebral artery origin is visualized. Left vertebral artery dominant. Vertebral arteries grossly patent within the neck with antegrade flow. No visible stenosis. IMPRESSION: MRI HEAD IMPRESSION: 1. Motion degraded exam. 2. No acute intracranial abnormality. 3. Mild-to-moderate chronic microvascular ischemic disease with a few scattered remote lacunar infarcts involving the left hemispheric cerebral white matter. MRA HEAD IMPRESSION: 1. Severely motion degraded exam. 2. Grossly negative intracranial  MRA. No large vessel occlusion. No obvious hemodynamically significant or correctable stenosis. 3. Irregularity with possible 8 mm outpouching extending inferiorly from the left ICA terminus. While this finding is favored to be artifactual, a possible aneurysm is difficult to exclude. Correlation with dedicated CTA and/or repeat MRA when the patient is able to tolerate the exam suggested for further evaluation. MRA NECK IMPRESSION: 1. Severely motion degraded  exam. 2. Grossly patent carotid artery systems and vertebral arteries within the neck. No obvious hemodynamically significant stenosis. 3. Dominant left vertebral artery. Hypoplastic right vertebral artery likely terminates in PICA. Electronically Signed   By: Jeannine Boga M.D.   On: 05/02/2022 05:13   MR ANGIO NECK WO CONTRAST  Result Date: 05/02/2022 CLINICAL DATA:  Follow-up examination for stroke. EXAM: MRI HEAD WITHOUT CONTRAST MRA HEAD WITHOUT CONTRAST MRA NECK WITHOUT CONTRAST TECHNIQUE: Multiplanar, multiecho pulse sequences of the brain and surrounding structures were obtained without intravenous contrast. Angiographic images of the Circle of Willis were obtained using MRA technique without intravenous contrast. Angiographic images of the neck were obtained using MRA technique without intravenous contrast. Carotid stenosis measurements (when applicable) are obtained utilizing NASCET criteria, using the distal internal carotid diameter as the denominator. COMPARISON:  Prior CT from 05/01/2022. FINDINGS: MRI HEAD FINDINGS Brain: Examination degraded by motion artifact. Cerebral volume within normal limits. Patchy T2/FLAIR hyperintensity involving the periventricular and deep white matter both cerebral hemispheres, most consistent with chronic small vessel ischemic disease, mild to moderate in nature. Few scattered superimposed remote lacunar infarcts noted at the left frontal centrum semi ovale. No evidence for acute or subacute ischemia.  Gray-white matter differentiation maintained. No areas of chronic cortical infarction. No acute or chronic intracranial blood products. No mass lesion or midline shift. No hydrocephalus or extra-axial fluid collection. Pituitary gland and suprasellar region normal. Vascular: Major intracranial vascular flow voids are maintained. Skull and upper cervical spine: Craniocervical junction within normal limits. Bone marrow signal intensity normal. No scalp soft tissue abnormality. Sinuses/Orbits: Globes and orbital soft tissues within normal limits. Paranasal sinuses are largely clear. No mastoid effusion. Other: None. MRA HEAD FINDINGS ANTERIOR CIRCULATION: Examination moderately to severely degraded by motion artifact. Visualized distal cervical segments of the internal carotid arteries are widely patent bilaterally. Petrous, cavernous, and supraclinoid segments patent without obvious hemodynamically significant stenosis. Irregularity with possible 8 mm outpouching extending inferiorly from the left ICA terminus (series 1, image 88). While this is favored to be artifactual, a possible aneurysm is difficult to exclude. A1 segments patent. Grossly normal anterior communicating artery complex. Anterior cerebral arteries grossly patent to their distal aspects. No obvious M1 stenosis. No visible proximal MCA branch occlusion. Distal MCA branches grossly perfused and symmetric. POSTERIOR CIRCULATION: Left vertebral artery dominant and patent to the vertebrobasilar junction without stenosis. Left PICA patent. Right vertebral artery hypoplastic and likely terminates in PICA, partially visualized on time-of-flight sequence. Basilar patent to its distal aspect without stenosis. Superior cerebellar arteries patent bilaterally. Both PCAs are patent and well perfused or distal aspects without appreciable stenosis. MRA NECK FINDINGS AORTIC ARCH: Examination severely degraded by motion artifact and lack of IV contrast. Aortic arch and  origin the great vessels not visualized or assessed on this exam. RIGHT CAROTID SYSTEM: Visualized right common and internal carotid arteries patent with antegrade flow. No obvious hemodynamically significant stenosis about the right carotid bulb. LEFT CAROTID SYSTEM: Visualized left common and internal carotid arteries patent with antegrade flow. No obvious hemodynamically significant stenosis about the left carotid bulb. VERTEBRAL ARTERIES: Neither vertebral artery origin is visualized. Left vertebral artery dominant. Vertebral arteries grossly patent within the neck with antegrade flow. No visible stenosis. IMPRESSION: MRI HEAD IMPRESSION: 1. Motion degraded exam. 2. No acute intracranial abnormality. 3. Mild-to-moderate chronic microvascular ischemic disease with a few scattered remote lacunar infarcts involving the left hemispheric cerebral white matter. MRA HEAD IMPRESSION: 1. Severely motion degraded exam. 2. Grossly negative intracranial MRA.  No large vessel occlusion. No obvious hemodynamically significant or correctable stenosis. 3. Irregularity with possible 8 mm outpouching extending inferiorly from the left ICA terminus. While this finding is favored to be artifactual, a possible aneurysm is difficult to exclude. Correlation with dedicated CTA and/or repeat MRA when the patient is able to tolerate the exam suggested for further evaluation. MRA NECK IMPRESSION: 1. Severely motion degraded exam. 2. Grossly patent carotid artery systems and vertebral arteries within the neck. No obvious hemodynamically significant stenosis. 3. Dominant left vertebral artery. Hypoplastic right vertebral artery likely terminates in PICA. Electronically Signed   By: Jeannine Boga M.D.   On: 05/02/2022 05:13   ECHOCARDIOGRAM COMPLETE  Result Date: 05/01/2022    ECHOCARDIOGRAM REPORT   Patient Name:   Robert [name] Date of Exam: 05/01/2022 Medical Rec #:  [id number]        Height:       71.0 in Accession #:     [id number]       Weight:       264.6 lb Date of Birth:  August 06, 1961        BSA:          2.375 m Patient Age:    19 years         BP:           99/60 mmHg Patient Gender: M                HR:           62 bpm. Exam Location:  Inpatient Procedure: 2D Echo, Cardiac Doppler and Color Doppler Indications:    Stroke  History:        Patient has prior history of Echocardiogram examinations, most                 recent 11/27/2021. TIA, Arrythmias:Atrial Fibrillation and                 Tachycardia, Signs/Symptoms:Edema, Fatigue and Dyspnea; Risk                 Factors:Hypertension, Diabetes and Dyslipidemia. Kidney                 Transplant, S/P AVR with a 77m Edwards inspiris resilia                 prosthetic.  Sonographer:    DWenda LowReferring Phys: [id number]: Patient is morbidly obese. IMPRESSIONS  1. Left ventricular ejection fraction, by estimation, is 60 to 65%. The left ventricle has normal function. The left ventricle has no regional wall motion abnormalities. There is moderate left ventricular hypertrophy. Left ventricular diastolic parameters are consistent with Grade II diastolic dysfunction (pseudonormalization).  2. Right ventricular systolic function is normal. The right ventricular size is normal. There is normal pulmonary artery systolic pressure.  3. Left atrial size was moderately dilated.  4. Right atrial size was mild to moderately dilated.  5. No evidence of mitral valve regurgitation. Moderate mitral annular calcification.  6. S/p AVR 27 mm Edwards Inspiris Resilia Prosthesis. No significant PVL. Well seated. AV max 2.3 m/s. Mean gradient 11 mmHg. Normal prosthesis. Aortic valve regurgitation is not visualized.  7. There is mild dilatation of the ascending aorta, measuring 39 mm.  8. The inferior vena cava is normal in size with greater than 50% respiratory variability, suggesting right atrial pressure of 3 mmHg. Comparison(s): No significant change  from prior study. FINDINGS  Left  Ventricle: Left ventricular ejection fraction, by estimation, is 60 to 65%. The left ventricle has normal function. The left ventricle has no regional wall motion abnormalities. The left ventricular internal cavity size was normal in size. There is  moderate left ventricular hypertrophy. Abnormal (paradoxical) septal motion consistent with post-operative status. Left ventricular diastolic parameters are consistent with Grade II diastolic dysfunction (pseudonormalization). Right Ventricle: The right ventricular size is normal. Right vetricular wall thickness was not well visualized. Right ventricular systolic function is normal. There is normal pulmonary artery systolic pressure. The tricuspid regurgitant velocity is 2.62 m/s, and with an assumed right atrial pressure of 3 mmHg, the estimated right ventricular systolic pressure is 29.9 mmHg. Left Atrium: Left atrial size was moderately dilated. Right Atrium: Right atrial size was mild to moderately dilated. Pericardium: There is no evidence of pericardial effusion. Mitral Valve: Moderate mitral annular calcification. No evidence of mitral valve regurgitation. MV peak gradient, 9.2 mmHg. The mean mitral valve gradient is 4.0 mmHg. Tricuspid Valve: The tricuspid valve is normal in structure. Tricuspid valve regurgitation is trivial. Aortic Valve: S/p AVR 27 mm Edwards Inspiris Resilia Prosthesis. No significant PVL. Well seated. AV max 2.3 m/s. Mean gradient 11 mmHg. Normal prosthesis. Aortic valve regurgitation is not visualized. Aortic valve mean gradient measures 11.0 mmHg. Aortic valve peak gradient measures 22.2 mmHg. Aortic valve area, by VTI measures 2.73 cm. Pulmonic Valve: The pulmonic valve was not well visualized. Pulmonic valve regurgitation is not visualized. Aorta: There is mild dilatation of the ascending aorta, measuring 39 mm. Venous: The inferior vena cava is normal in size with greater than 50% respiratory  variability, suggesting right atrial pressure of 3 mmHg.  LEFT VENTRICLE PLAX 2D LVIDd:         5.05 cm     Diastology LVIDs:         3.20 cm     LV e' medial:    5.55 cm/s LV PW:         1.65 cm     LV E/e' medial:  24.5 LV IVS:        1.55 cm     LV e' lateral:   8.38 cm/s LVOT diam:     2.10 cm     LV E/e' lateral: 16.2 LV SV:         137 LV SV Index:   58 LVOT Area:     3.46 cm  LV Volumes (MOD) LV vol d, MOD A2C: 59.5 ml LV vol d, MOD A4C: 83.5 ml LV vol s, MOD A2C: 13.9 ml LV vol s, MOD A4C: 21.4 ml LV SV MOD A2C:     45.6 ml LV SV MOD A4C:     83.5 ml LV SV MOD BP:      53.3 ml RIGHT VENTRICLE RV Basal diam:  5.60 cm RV Mid diam:    4.30 cm RV S prime:     14.10 cm/s TAPSE (M-mode): 3.2 cm LEFT ATRIUM              Index        RIGHT ATRIUM           Index LA diam:        4.90 cm  2.06 cm/m   RA Area:     32.00 cm LA Vol (A2C):   135.0 ml 56.85 ml/m  RA Volume:   126.00 ml 53.06 ml/m LA Vol (A4C):   131.0 ml 55.16 ml/m LA Biplane Vol: 135.0 ml 56.85 ml/m  AORTIC VALVE                     PULMONIC VALVE AV Area (Vmax):    2.60 cm      PV Vmax:       0.93 m/s AV Area (Vmean):   2.51 cm      PV Peak grad:  3.5 mmHg AV Area (VTI):     2.73 cm AV Vmax:           235.50 cm/s AV Vmean:          149.000 cm/s AV VTI:            0.500 m AV Peak Grad:      22.2 mmHg AV Mean Grad:      11.0 mmHg LVOT Vmax:         177.00 cm/s LVOT Vmean:        108.000 cm/s LVOT VTI:          0.395 m LVOT/AV VTI ratio: 0.79  AORTA Ao Root diam: 3.80 cm Ao Asc diam:  3.90 cm MITRAL VALVE                TRICUSPID VALVE MV Area (PHT): 2.97 cm     TR Peak grad:   27.5 mmHg MV Area VTI:   2.26 cm     TR Vmax:        262.00 cm/s MV Peak grad:  9.2 mmHg MV Mean grad:  4.0 mmHg     SHUNTS MV Vmax:       1.52 m/s     Systemic VTI:  0.40 m MV Vmean:      90.7 cm/s    Systemic Diam: 2.10 cm MV Decel Time: 255 msec MV E velocity: 136.00 cm/s MV A velocity: 133.00 cm/s MV E/A ratio:  1.02 Placido Sou signed by Phineas Inches  Signature Date/Time: 05/01/2022/4:15:36 PM    Final    CT HEAD CODE STROKE WO CONTRAST  Result Date: 05/01/2022 CLINICAL DATA:  Code stroke. Neuro deficit, acute, stroke suspected. EXAM: CT HEAD WITHOUT CONTRAST TECHNIQUE: Contiguous axial images were obtained from the base of the skull through the vertex without intravenous contrast. RADIATION DOSE REDUCTION: This exam was performed according to the departmental dose-optimization program which includes automated exposure control, adjustment of the mA and/or kV according to patient size and/or use of iterative reconstruction technique. COMPARISON:  Head CT May 01, 2022 at 12:25 a.m. FINDINGS: Brain: No evidence of acute infarction, hemorrhage, hydrocephalus, extra-axial collection or mass lesion/mass effect. Remote lacunar infarcts in the left centrum semiovale and deep white matter of the left frontal lobe. Vascular: Calcified plaques in the bilateral vertebral arteries and carotid siphons. Skull: Normal. Negative for fracture or focal lesion. Sinuses/Orbits: No acute finding. Other: None. ASPECTS [facility] Stroke Program Early CT Score) - Ganglionic level infarction (caudate, lentiform nuclei, internal capsule, insula, M1-M3 cortex): 7 - Supraganglionic infarction (M4-M6 cortex): 3 Total score (0-10 with 10 being normal): 10 IMPRESSION: 1. No acute intracranial abnormality. No significant change from prior CT. 2. ASPECTS is 10. These results were transmitted via [facility] page system at the time of interpretation on 05/01/2022 at 10:46 am to provider Dr. Leonel Ramsay. Electronically Signed   By: Pedro Earls M.D.   On: 05/01/2022 10:47   CT Head Wo Contrast  Result Date: 05/01/2022 CLINICAL DATA:  Neuro deficit, prior stroke EXAM: CT HEAD WITHOUT CONTRAST TECHNIQUE: Contiguous axial images were obtained from the base  of the skull through the vertex without intravenous contrast. RADIATION DOSE REDUCTION: This exam was performed according to the  departmental dose-optimization program which includes automated exposure control, adjustment of the mA and/or kV according to patient size and/or use of iterative reconstruction technique. COMPARISON:  11/24/2021 FINDINGS: Brain: No evidence of acute infarction, hemorrhage, hydrocephalus, extra-axial collection or mass lesion/mass effect. Old lacunar infarcts in the subcortical left frontal lobe. Vascular: Intracranial atherosclerosis. Skull: Normal. Negative for fracture or focal lesion. Sinuses/Orbits: The visualized paranasal sinuses are essentially clear. The mastoid air cells are unopacified. Other: None. IMPRESSION: No acute intracranial abnormality. Old lacunar infarcts in the subcortical left frontal lobe. Electronically Signed   By: Julian Hy M.D.   On: 05/01/2022 00:43   CT Renal Stone Study  Result Date: 04/30/2022 CLINICAL DATA:  Flank pain, hematuria EXAM: CT ABDOMEN AND PELVIS WITHOUT CONTRAST TECHNIQUE: Multidetector CT imaging of the abdomen and pelvis was performed following the standard protocol without IV contrast. RADIATION DOSE REDUCTION: This exam was performed according to the departmental dose-optimization program which includes automated exposure control, adjustment of the mA and/or kV according to patient size and/or use of iterative reconstruction technique. COMPARISON:  11/09/2021 FINDINGS: Lower chest: Lung bases are clear. Hepatobiliary: Unenhanced liver is unremarkable. Layering gallbladder sludge. No intrahepatic or extrahepatic duct dilatation. Pancreas: Within normal limits. Spleen: Within normal limits. Adrenals/Urinary Tract: Adrenal glands are within normal limits. Severe atrophy of the native left kidney. Mild atrophy of the native right kidney. No renal calculi or hydronephrosis. Right lower quadrant renal transplant with gas in right renal collecting system. No hydronephrosis. Nondependent gas in the bladder. Stomach/Bowel: Stomach is within normal limits. No  evidence of bowel obstruction. Normal appendix (series 3/image 54). Mild left colonic diverticulosis, without evidence of diverticulitis. Vascular/Lymphatic: No evidence of abdominal aortic aneurysm. Atherosclerotic calcifications of the abdominal aorta and branch vessels. No suspicious abdominopelvic lymphadenopathy. Reproductive: Prostate is unremarkable. Other: Small fat containing left inguinal hernia (series 3/image 85). Musculoskeletal: Degenerative changes of the visualized thoracolumbar spine. IMPRESSION: Right lower quadrant renal transplant with gas in the right renal collecting system and bladder. Correlate for recent intervention/catheterization. Otherwise, this appearance suggests cystitis with ascending infection. No renal, ureteral, or bladder calculi. No hydronephrosis. Additional ancillary findings as above. Electronically Signed   By: Julian Hy M.D.   On: 04/30/2022 20:02     PHYSICAL EXAM  Temp:  [97.6 F (36.4 C)-98.5 F (36.9 C)] 98.3 F (36.8 C) (07/26 1500) Pulse Rate:  [62-78] 78 (07/26 1500) Resp:  [18-20] 18 (07/26 1500) BP: (141-159)/(58-85) 154/62 (07/26 1500) SpO2:  [96 %-100 %] 96 % (07/26 1500) Weight:  [128.9 kg] 128.9 kg (07/26 0500)  General - morbid obesity, well developed, in no apparent distress.  Ophthalmologic - fundi not visualized due to noncooperation.  Cardiovascular - Regular rhythm and rate, not in A-fib.  Mental Status -  Level of arousal and orientation to time, place, and person were intact. Language including expression, naming, repetition, comprehension was assessed and found intact. Fund of Knowledge was assessed and was intact.  Cranial Nerves II - XII - II - Visual field intact OU. III, IV, VI - Extraocular movements intact. V - Facial sensation decreased bilaterally. VII - Facial movement intact bilaterally. VIII - Hearing & vestibular intact bilaterally.  No nystagmus X - Palate elevates symmetrically. XI - Chin turning &  shoulder shrug intact bilaterally. XII - Tongue protrusion intact.  Motor Strength - The patient's strength was normal in all extremities and pronator  drift was absent.  Bulk was normal and fasciculations were absent.   Motor Tone - Muscle tone was assessed at the neck and appendages and was normal.  Reflexes - The patient's reflexes were symmetrical in all extremities and he had no pathological reflexes.  Sensory - Light touch, temperature/pinprick were assessed and were symmetrical, subjective funny feeling on the right arm but improved from yesterday.    Coordination - The patient had normal movements in the hands with no ataxia or dysmetria.  Tremor was absent.  Gait and Station - deferred.   ASSESSMENT/PLAN [name] is a 61 y.o. male with history of CAD, PAF on Eliquis, hypertension, HLD, ESRD status post renal transplant, diabetes, OSA on BiPAP, obesity, status post AVR and CABG in 09/2021 with subsequent procedure related embolic strokes admitted for right flank pain, hematuria.  Developed headache, dizziness, right arm weakness numbness in the bilateral facial numbness.  No tPA given due to none disabling symptoms.    Subjective bilateral facial numbness and right arm funny feeling CT no acute abnormality MRI no acute infarct MRA head and neck unremarkable.  Reported 8 mm left ICA terminal outpouching likely artifact due to motion.  Patient MRA head and neck in 10/2021 no such finding. 2D Echo EF 60 to 65% LDL 65 HgbA1c 7.4 Eliquis for VTE prophylaxis Eliquis prior to admission, now on Eliquis.  Continue Eliquis on discharge Patient counseled to be compliant with his antithrombotic medications Ongoing aggressive stroke risk factor management Therapy recommendations: Home health PT Disposition: Pending  Chronic headache and dizziness chronic dizziness spells at home, once every other day, mostly happened during sitting position, lasting 5 to 10 minutes and resolved.   Never happened during motion.  Close eyes feels better during the episode.  Dizziness was not typically associate with headache.   Yesterday again he had dizziness/vertigo while lying in bed, had a movement does not have much  effect on dizziness, but this time associate with mild headache.  Currently dizziness resolved. Description not consistent with BPPV.  However recommend PT for vestibular evaluation in a.m. Continue outpatient follow-up with Dr. Tomi Likens at [facility] On Eliquis PTA Currently not in A-fib Continue Eliquis on discharge  Diabetes HgbA1c 7.4 goal < 7.0 Uncontrolled CBG monitoring SSI DM education and close PCP follow up  Hypertension Stable Long term BP goal normotensive  Hyperlipidemia Home meds: Crestor 20 LDL 65, goal < 70 Continue Crestor 20 Continue statin at discharge  Other Stroke Risk Factors Obesity, Body mass index is 39.63 kg/m.  Hx stroke in 09/2021 post CABG procedure Coronary artery disease status post CABG in 09/2021 AS status post AVR in 09/2021 Obstructive sleep apnea, on BiPAP at home  Other Active Problems ESRD status post renal transplant, on CellCept and prednisone  Hospital day # 2  Neurology will sign off. Please call with questions. Pt will follow up with Dr. Tomi Likens at [facility] in about 4 weeks. Thanks for the consult.   Rosalin Hawking, MD PhD Stroke Neurology 05/02/2022 6:04 PM    To contact Stroke Continuity provider, please refer to [email or web address] After hours, contact General Neurology

## 2022-05-02 NOTE — Progress Notes (Signed)
Occupational Therapy Treatment Patient Details Name: Robert [name] MRN: [id number] DOB: Jul 07, 1961 Today's Date: 05/02/2022   History of present illness 61 y/o male presented to ED on 05/01/2022 for R flank pain x 1 week, and hematuria for 24hr, R face and arm numbness and R arm weakness. CT negative for acute abnormality 05/01/2022. PMH includes IDDM, CKD s/p renal transplant, HTN, CAD s/p CABG 38/2505, embolic CVA subcortical L frontal lobe with [name] residual weakness.   OT comments  Pt making steady progress towards OT goals this session. Pt continues to present with dizziness seeming to be related to positional changes ( suspect vestibular deficits?), decreased strength, and generalized deconditioning. Session focus on increasing overall activity tolerance, dynamic balance and BADL reeducation. Pt currently requires supervision for LB ADLS ( via figure method to decrease dizziness) and min guard for ADL transfers with Rw. Pt would continue to benefit from skilled occupational therapy while admitted to address the below listed limitations in order to improve overall functional mobility and facilitate independence with BADL participation. DC plan remains appropriate, will follow acutely per POC.      Recommendations for follow up therapy are one component of a multi-disciplinary discharge planning process, led by the attending physician.  Recommendations may be updated based on patient status, additional functional criteria and insurance authorization.    Follow Up Recommendations  No OT follow up    Assistance Recommended at Discharge Intermittent Supervision/Assistance  Patient can return home with the following  A little help with walking and/or transfers;A little help with bathing/dressing/bathroom;Assistance with cooking/housework;Assist for transportation;Direct supervision/assist for medications management;Direct supervision/assist for financial management   Equipment Recommendations   None recommended by OT    Recommendations for Other Services      Precautions / Restrictions Precautions Precautions: Fall Restrictions Weight Bearing Restrictions: No       Mobility Bed Mobility Overal bed mobility: Needs Assistance Bed Mobility: Supine to Sit     Supine to sit: Supervision, HOB elevated     General bed mobility comments: supervision for safety, line mgmt, initial close supervision d/t reports of dizziness    Transfers Overall transfer level: Needs assistance Equipment used: Rolling walker (2 wheels) Transfers: Sit to/from Stand Sit to Stand: Min guard           General transfer comment: minguard to rise from EOB, cues for hand placement, initial steadying assist d/t reports of dizziness     Balance Overall balance assessment: Needs assistance Sitting-balance support: No upper extremity supported, Feet supported Sitting balance-Leahy Scale: Good Sitting balance - Comments: able to reach of out BOS to don socks with no LOB   Standing balance support: Bilateral upper extremity supported, During functional activity Standing balance-Leahy Scale: Fair                             ADL either performed or assessed with clinical judgement   ADL Overall ADL's : Needs assistance/impaired             Lower Body Bathing: Set up;Supervison/ safety;Sitting/lateral leans Lower Body Bathing Details (indicate cue type and reason): simulated via LB dressing     Lower Body Dressing: Supervision/safety;Set up;Sitting/lateral leans Lower Body Dressing Details (indicate cue type and reason): to don socks from EOB via figure four method Toilet Transfer: Min guard;Ambulation;Rolling walker (2 wheels) Toilet Transfer Details (indicate cue type and reason): simulated via functiona mobility with RW  Functional mobility during ADLs: Min guard;Rolling walker (2 wheels) General ADL Comments: limited by decreased strength during ADLS     Extremity/Trunk Assessment Upper Extremity Assessment Upper Extremity Assessment: RUE deficits/detail RUE Deficits / Details: impaired muscle strength grossly 4/5 mmt, pt noted to have functional grip strength however pt continues to report weakness in RUE RUE Sensation: decreased light touch   Lower Extremity Assessment Lower Extremity Assessment: Defer to PT evaluation        Vision Baseline Vision/History: 1 Wears glasses Patient Visual Report: No change from baseline Additional Comments: no reports of diplopia this session   Perception Perception Perception: Not tested   Praxis Praxis Praxis: Not tested    Cognition Arousal/Alertness: Awake/alert Behavior During Therapy: WFL for tasks assessed/performed Overall Cognitive Status: Within Functional Limits for tasks assessed                                          Exercises      Shoulder Instructions       General Comments reports dizziness during transitions ( supine>sit, sit>stand) education provided on gaze stabilization strategies, would benefit from furhter vestibular testing    Pertinent Vitals/ Pain       Pain Assessment Pain Assessment: 0-10 Pain Score: 1  Pain Location: R flank pain and headache Pain Descriptors / Indicators: Discomfort, Aching Pain Intervention(s): Monitored during session  Home Living                                          Prior Functioning/Environment              Frequency           Progress Toward Goals  OT Goals(current goals can now be found in the care plan section)  Progress towards OT goals: Progressing toward goals  Acute Rehab OT Goals Patient Stated Goal: none stated Time For Goal Achievement: 05/15/22 Potential to Achieve Goals: Good  Plan Discharge plan remains appropriate;Frequency remains appropriate    Co-evaluation                 AM-PAC OT "6 Clicks" Daily Activity     Outcome Measure   Help  from another person eating meals?: None Help from another person taking care of personal grooming?: A Little Help from another person toileting, which includes using toliet, bedpan, or urinal?: A Little Help from another person bathing (including washing, rinsing, drying)?: A Little Help from another person to put on and taking off regular upper body clothing?: None Help from another person to put on and taking off regular lower body clothing?: None 6 Click Score: 21    End of Session Equipment Utilized During Treatment: Gait belt;Rolling walker (2 wheels)  OT Visit Diagnosis: Unsteadiness on feet (R26.81);Muscle weakness (generalized) (M62.81);Other abnormalities of gait and mobility (R26.89);Dizziness and giddiness (R42)   Activity Tolerance Patient tolerated treatment well   Patient Left Other (comment) (handed off to PT in hallway)   Nurse Communication          Time: 2841-3244 OT Time Calculation (min): 10 min  Charges: OT General Charges $OT Visit: 1 Visit OT Treatments $Self Care/Home Management : 8-22 mins  Harley Alto., COTA/L Acute Rehabilitation Services [phone]   Precious Haws 05/02/2022, 1:18 PM

## 2022-05-02 NOTE — Inpatient Diabetes Management (Signed)
Inpatient Diabetes Program Recommendations  AACE/ADA: New Consensus Statement on Inpatient Glycemic Control (2015)  Target Ranges:  Prepandial:   less than 140 mg/dL      Peak postprandial:   less than 180 mg/dL (1-2 hours)      Critically ill patients:  140 - 180 mg/dL   Lab Results  Component Value Date   GLUCAP 104 (H) 05/02/2022   HGBA1C 7.4 (H) 05/02/2022    Review of Glycemic Control  Diabetes history: DM 2 Outpatient Diabetes medications: Omnipod insulin pump with concentrated U-500 insulin Insulin pump settings Midnight = 0.15, 6 AM = 2.2, 11 AM = 1 point 4 and 5 PM = 0.20 Bolus settings: Insulin to carb ratio 1:15 Sensitivity or ISF 45 instead of 60  Current orders for Inpatient glycemic control:  Insulin pump order set  Spoke with pt at bedside regarding insulin pump settings and hypoglycemia episodes. Pt reports guessing at the amount of carbs and bolusing with his pump. Pt uses Humulin R U-500 insulin which has short acting and long acting insulin properties. Together with the insulin and guessing the amount of carbohydrates with his meal, I feel this contributed to his hypoglycemia. Pt has close follow up and communication with Endocrinologist, Dr. Dwyane Dee. Pt had an appointment today 7/26 with CDE Leonia Reader that he will reschedule.  Discussed pt with Dr. Olevia Bowens.  Thanks,  Tama Headings RN, MSN, BC-ADM Inpatient Diabetes Coordinator Team Pager [phone] (8a-5p)

## 2022-05-03 ENCOUNTER — Other Ambulatory Visit (HOSPITAL_COMMUNITY): Payer: Self-pay

## 2022-05-03 ENCOUNTER — Inpatient Hospital Stay: Payer: Self-pay

## 2022-05-03 DIAGNOSIS — N390 Urinary tract infection, site not specified: Secondary | ICD-10-CM | POA: Diagnosis not present

## 2022-05-03 DIAGNOSIS — I480 Paroxysmal atrial fibrillation: Secondary | ICD-10-CM | POA: Diagnosis not present

## 2022-05-03 DIAGNOSIS — I10 Essential (primary) hypertension: Secondary | ICD-10-CM | POA: Diagnosis not present

## 2022-05-03 DIAGNOSIS — N12 Tubulo-interstitial nephritis, not specified as acute or chronic: Secondary | ICD-10-CM | POA: Diagnosis not present

## 2022-05-03 LAB — GLUCOSE, CAPILLARY
Glucose-Capillary: 144 mg/dL — ABNORMAL HIGH (ref 70–99)
Glucose-Capillary: 93 mg/dL (ref 70–99)

## 2022-05-03 MED ORDER — SULFAMETHOXAZOLE-TRIMETHOPRIM 800-160 MG PO TABS
1.0000 | ORAL_TABLET | Freq: Two times a day (BID) | ORAL | Status: DC
Start: 2022-05-03 — End: 2022-05-03

## 2022-05-03 MED ORDER — SULFAMETHOXAZOLE-TRIMETHOPRIM 800-160 MG PO TABS
1.0000 | ORAL_TABLET | Freq: Two times a day (BID) | ORAL | 0 refills | Status: DC
  Filled 2022-05-03: qty 8, 4d supply, fill #0

## 2022-05-03 MED ORDER — SULFAMETHOXAZOLE-TRIMETHOPRIM 800-160 MG PO TABS
1.0000 | ORAL_TABLET | Freq: Two times a day (BID) | ORAL | 0 refills | Status: AC
Start: 2022-05-03 — End: 2022-05-09
  Filled 2022-05-03: qty 12, 6d supply, fill #0

## 2022-05-03 MED ORDER — INSULIN PUMP
Freq: Three times a day (TID) | SUBCUTANEOUS | Status: DC
  Filled 2022-05-03: qty 1

## 2022-05-03 NOTE — TOC Transition Note (Signed)
Transition of Care [facility]) - CM/SW Discharge Note   Patient Details  Name: ARIEL WINGROVE MRN: [id number] Date of Birth: 1961/02/08  [facility]) CM/SW Contact:  Pollie Friar, RN Phone Number: 05/03/2022, 12:02 PM   Clinical Narrative:    Pt is discharging home with home health services through [place]. Information on the AVS.  Pt has transportation home.    Final next level of care: Home w Home Health Services Barriers to Discharge: No Barriers Identified   Patient Goals and CMS Choice   CMS Medicare.gov Compare Post Acute Care list provided to:: Patient Choice offered to / list presented to : Patient  Discharge Placement                       Discharge Plan and Services   Discharge Planning Services: CM Consult Post Acute Care Choice: Home Health                    HH Arranged: PT [place]: [place] Date [facility] Agency Contacted: 05/02/22   Representative spoke with at [place]: [place] ([place]) Interventions     Readmission Risk Interventions    11/10/2021   12:06 PM  Readmission Risk Prevention Plan  Transportation Screening Complete  Medication Review [employer]) Referral to Pharmacy  PCP or Specialist appointment within 3-5 days of discharge Not Complete  PCP/Specialist Appt Not Complete comments Patient not ready for discharge  [place] or [place] Not Complete  HRI or Home Care Consult Pt Refusal Comments possible SNF placement  SW Recovery Care/Counseling Consult Complete  Palliative Care Screening Not Between Not Complete  SNF Comments pending PT/ OT orders

## 2022-05-03 NOTE — Discharge Instructions (Signed)

## 2022-05-03 NOTE — Progress Notes (Signed)
Pharmacy Antibiotic Note  Robert [name] is a 61 y.o. male for which pharmacy has been consulted for meropenem dosing for ESBL pyelonephritis. Patient presented with dizziness, flank pain and hematuria. Pt has CKD IIIb, Scr at baseline, WBCs are wnl and pt is afebrile.   Plan: Continue Meropenem IV 1g q12h  through 7/31  Monitor CBC, fever curve, renal function, & clinical course  Height: '5\' 11"'$  (180.3 cm) Weight: 130 kg (286 lb 9.6 oz) IBW/kg (Calculated) : 75.3  Temp (24hrs), Avg:98.2 F (36.8 C), Min:97.8 F (36.6 C), Max:98.5 F (36.9 C)  Recent Labs  Lab 04/30/22 1414 04/30/22 2253 05/01/22 0354 05/02/22 0947  WBC 8.4  --  7.5 7.6  CREATININE 2.21*  --  2.16*  --   LATICACIDVEN  --  1.1 0.8  --      Estimated Creatinine Clearance: 48.8 mL/min (A) (by C-G formula based on SCr of 2.16 mg/dL (H)).    No Known Allergies  Antimicrobials this admission: meropenem 7/24 >> (7/31)  Microbiology results: 7/24- >/=100,000 colonies/mL ESBL Klebsiella Pneumoniae   Thank you for allowing pharmacy to be a part of this patient's care.  Eliseo Gum, PharmD PGY1 Pharmacy Resident   05/03/2022  10:29 AM

## 2022-05-03 NOTE — Progress Notes (Addendum)
Physical Therapy Treatment Patient Details Name: Robert [name] MRN: [id number] DOB: 15-Oct-1960 Today's Date: 05/03/2022   History of Present Illness 61 y/o male presented to ED on 05/01/2022 for R flank pain x 1 week, and hematuria for 24hr, R face and arm numbness and R arm weakness. CT negative for acute abnormality 05/01/2022. MRI negative for acute abnormaility. 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    PT Comments    Patient presents with symptoms consistent with bilateral vestibular hypofunction.  He has concern for balance issues as well with vestibular impairment, neuropathy and diplopia and reliant on glasses.  He was educated in visual adaptation exercises with seated gaze stabilization.  Feel he should progress with HHPT for vestibular progression and balance therapy.  Reports wife working so transportation to outpatient would be difficult.  PT will continue to follow acutely.     Vestibular Assessment - 05/03/22 0001       Symptom Behavior   Subjective history of current problem Reports had dizziness since heart surgery in Dec 2022.  States has episodic spinning that lasts for minutes about every other day.  States has had neurology evaluation with issues related to ischemia in the brain from vascular issues.  Double vision which his ophthalmologist isn't wanting to prescribe prisms for.  States has had a few falls ever since 2015, but not passing out, usually trips over grass or carpet and has struck his head before without sequellae.  States dizziness is usually disorientation but has episodic spinning.    Type of Dizziness  Imbalance;Spinning;Lightheadedness    Frequency of Dizziness intermittent    Duration of Dizziness minutes    Symptom Nature Spontaneous;Variable;Intermittent    Aggravating Factors No known aggravating factors;Turning head quickly;Supine to sit    Relieving  Factors Closing eyes    Progression of Symptoms Worse      Oculomotor Exam   Oculomotor Alignment Abnormal   R eye lower in orbit and slightly more nasal than L; does have some diplopia more when looking to L   Ocular ROM WFL    Spontaneous Absent    Gaze-induced  Absent    Smooth Pursuits Intact    Saccades Intact      Oculomotor Exam-Fixation Suppressed    Left Head Impulse positive for refixation saccade    Right Head Impulse positive for refixation saccade      Vestibulo-Ocular Reflex   VOR 1 Head Only (x 1 viewing) performed with horizontal and vertical head movements x 10 head turns with dizziness 2-3/10 (baseline dizziness 2/10)      Positional Testing   Sidelying Test Sidelying Right;Sidelying Left    Horizontal Canal Testing Horizontal Canal Right;Horizontal Canal Left      Sidelying Right   Sidelying Right Duration 60 sec    Sidelying Right Symptoms No nystagmus      Sidelying Left   Sidelying Left Duration 60 sec    Sidelying Left Symptoms No nystagmus      Horizontal Canal Right   Horizontal Canal Right Duration 60 sec    Horizontal Canal Right Symptoms Normal      Horizontal Canal Left   Horizontal Canal Left Duration 60 sec    Horizontal Canal Left Symptoms Normal              Recommendations for follow up therapy are one component of a multi-disciplinary discharge planning process, led by the  attending physician.  Recommendations may be updated based on patient status, additional functional criteria and insurance authorization.  Follow Up Recommendations  Home health PT     Assistance Recommended at Discharge Intermittent Supervision/Assistance  Patient can return home with the following Help with stairs or ramp for entrance;Assist for transportation   Equipment Recommendations  None recommended by PT    Recommendations for Other Services       Precautions / Restrictions Precautions Precautions: Fall     Mobility  Bed Mobility Overal bed  mobility: Modified Independent             General bed mobility comments: increased time, but no physical help except for positioning for testing for BPPV    Transfers                        Ambulation/Gait                   Stairs             Wheelchair Mobility    Modified Rankin (Stroke Patients Only)       Balance     Sitting balance-Leahy Scale: Good                                      Cognition Arousal/Alertness: Awake/alert Behavior During Therapy: WFL for tasks assessed/performed Overall Cognitive Status: Within Functional Limits for tasks assessed                                          Exercises      General Comments General comments (skin integrity, edema, etc.): Completed gaze stabilization exercises x 1 with education and handout given,      Pertinent Vitals/Pain Pain Assessment Faces Pain Scale: No hurt    Home Living                          Prior Function            PT Goals (current goals can now be found in the care plan section) Progress towards PT goals: Progressing toward goals    Frequency    Min 3X/week      PT Plan Current plan remains appropriate    Co-evaluation              AM-PAC PT "6 Clicks" Mobility   Outcome Measure  Help needed turning from your back to your side while in a flat bed without using bedrails?: None Help needed moving from lying on your back to sitting on the side of a flat bed without using bedrails?: None Help needed moving to and from a bed to a chair (including a wheelchair)?: A Little Help needed standing up from a chair using your arms (e.g., wheelchair or bedside chair)?: A Little Help needed to walk in hospital room?: A Little Help needed climbing 3-5 steps with a railing? : A Lot 6 Click Score: 19    End of Session   Activity Tolerance: Patient tolerated treatment well Patient left: in bed;with call  bell/phone within reach   PT Visit Diagnosis: Other abnormalities of gait and mobility (R26.89);Muscle weakness (generalized) (M62.81);Hemiplegia and hemiparesis Hemiplegia - Right/Left: Right Hemiplegia - dominant/non-dominant: Non-dominant Hemiplegia - caused  by: Cerebral infarction     Time: 0340-3524 PT Time Calculation (min) (ACUTE ONLY): 45 min  Charges:  $Therapeutic Activity: 8-22 mins $Neuromuscular Re-education: 8-22 mins $Self Care/Home Management: 8-22                     Magda Kiel, PT Acute Rehabilitation Services [phone] 05/03/2022    Reginia Naas 05/03/2022, 12:36 PM

## 2022-05-03 NOTE — Discharge Summary (Addendum)
Physician Discharge Summary  Robert [name] [facility]:[id number] DOB: August 28, 1961 DOA: 04/30/2022  PCP: Robert Pao, MD  Admit date: 04/30/2022 Discharge date: 05/03/2022  Admitted From: Home Disposition:  home  Recommendations for Outpatient Follow-up:  Follow up with PCP in 1-2 weeks Please obtain BMP/CBC in one week   Home Health:Yes Equipment/Devices:None  Discharge Condition:Stable CODE STATUS:Full Diet recommendation: Heart Healthy  Brief/Interim Summary: 61 y.o. male past medical history of renal transplant, diabetes mellitus type 2 presents with flank pain dysuria and hematuria admitted for acute pyelonephritis, on 05/01/2022 developed bilateral facial numbness and right arm numbness and weakness code stroke was called.  Discharge Diagnoses:  Principal Problem:   Pyelonephritis Active Problems:   History of ESBL Klebsiella pneumoniae infection   Obstructive sleep apnea   Hypothyroidism   Stage 3b chronic kidney disease (CKD)   Type 2 diabetes mellitus with hyperglycemia, with long-term current use of insulin   Morbid obesity (HCC)   Kidney transplanted   Diabetic neuropathy   Immunosuppressed status   S/P AVR (aortic valve replacement)   Benign essential hypertension   Atrial fibrillation   DNR (do not resuscitate)/DNI(Do Not Intubate)   TIA (transient ischemic attack)  Acute pyelonephritis: With a history of ESBL who started empirically on IV meropenem urine culture grew 100,000 colonies of Klebsiella ESBL blood cultures remain negative till date. He was sensitive to Bactrim we will change to oral Bactrim she will continue as an outpatient for a total of 10 days.  Right arm weakness/numbness and bilateral facial numbness: Sudden onset. To CT scan showed an old lacunar infarcts no evidence of acute CVA. MRI of the brain showed no acute intracranial abnormalities MR a of the head was grossly negative. Neurology was consulted who agree with stroke work-up they  recommended to hold anticoagulation until work-up was done it was completed he will resume his Eliquis as an outpatient. He was started on high-dose statins and will continue aspirin as an outpatient. Physical therapy evaluated the patient recommended home health PT.  Chronic atrial fibrillation: No changes made continue Coreg and apixaban.  Essential hypertension: Resume antihypertensive medications no changes made.  History of bioprosthetic valve: Noted.  History of renal cell transplant on immunosuppressive therapy: Continue CellCept and prednisone no changes made.  Chronic kidney disease stage IIIb/renal transplant: Noted.  Diabetes mellitus type 2 with hyperglycemia with long-term insulin/diabetic neuropathy: He was continue on his insulin pump no changes made.  Morbid obesity: Noted.  Hypothyroidism: Continue Synthroid.  Obstructive sleep apnea: Continue CPAP.  Discharge Instructions  Discharge Instructions     Ambulatory referral to Neurology   Complete by: As directed    Follow up with Robert [name] at [facility] in 4 weeks.  Patient is Dr. Talbert [name] patient.  Thanks.   Diet - low sodium heart healthy   Complete by: As directed    Increase activity slowly   Complete by: As directed    No wound care   Complete by: As directed       Allergies as of 05/03/2022   No Known Allergies      Medication List     TAKE these medications    acetaminophen 325 MG tablet Commonly known as: TYLENOL Take 2 tablets (650 mg total) by mouth every 4 (four) hours as needed for moderate pain.   albuterol 108 (90 Base) MCG/ACT inhaler Commonly known as: VENTOLIN HFA Inhale 2 puffs into the lungs every 6 (six) hours as needed for wheezing or shortness of breath.  amLODipine 5 MG tablet Commonly known as: NORVASC Take 1 tablet (5 mg total) by mouth daily. What changed: how much to take   apixaban 5 MG Tabs tablet Commonly known as: ELIQUIS Take 1 tablet (5 mg total) by mouth 2  (two) times daily. Stop coumadin, monitor the PT/INR, and start Eliquis when the INR is <2.   B-D UF III MINI PEN NEEDLES 31G X 5 MM Misc Generic drug: Insulin Pen Needle Use to inject insulin 4 times a day   BD Disp Needle 23G X 1" Misc Generic drug: NEEDLE (DISP) 23 G USE TO INJECT TESTOSTERONE MEDICATION   BD Hypodermic Needle 18G X 1" Misc Generic drug: NEEDLE (DISP) 18 G Inject 2 each into the muscle every 7 (seven) days. Use 2 needles once weekly for Testosterone medication. Use 1 needle to draw up medication and 2'nd needle to inject into muscle.   carvedilol 25 MG tablet Commonly known as: COREG Take 1 tablet (25 mg total) by mouth 2 (two) times daily with a meal.   chlorthalidone 25 MG tablet Commonly known as: HYGROTON Take 1 tablet by mouth daily.   doxazosin 2 MG tablet Commonly known as: CARDURA Take 0.5 tablets (1 mg total) by mouth daily. Take 0.5 Tablet Daily   DULoxetine HCl 30 MG Csdr Take 30 mg by mouth daily.   FreeStyle Libre 3 Sensor Misc SMARTSIG:1 Topical Every 2 Weeks   gabapentin 100 MG capsule Commonly known as: NEURONTIN Take 200-300 mg by mouth See admin instructions. Taking 200 mg in the AM and 300 mg in the evening   HUMULIN R 500 UNIT/ML injection Generic drug: insulin regular human CONCENTRATED Inject 125 Units into the skin every 3 (three) days. Omnipod   hydrALAZINE 100 MG tablet Commonly known as: APRESOLINE TAKE 1 TABLET BY MOUTH EVERY 8 HOURS What changed:  when to take this additional instructions   insulin aspart 100 UNIT/ML injection Commonly known as: novoLOG Inject 5-20 Units into the skin daily as needed for high blood sugar.   levothyroxine 112 MCG tablet Commonly known as: SYNTHROID Take 1 tablet (112 mcg total) by mouth daily.   mycophenolate 250 MG capsule Commonly known as: CELLCEPT Take 1,000 mg by mouth every 12 (twelve) hours.   nitroGLYCERIN 0.4 MG SL tablet Commonly known as: NITROSTAT Place 0.4 mg  under the tongue every 5 (five) minutes as needed for chest pain.   NON FORMULARY CPAP/BIPAP  at bedtime  BIPAP per pt   Nulojix 250 MG Solr injection Generic drug: belatacept Inject 24.5 mLs into the vein every 28 (twenty-eight) days.   predniSONE 5 MG tablet Commonly known as: DELTASONE Take 5 mg by mouth daily with breakfast. Continuously   rosuvastatin 20 MG tablet Commonly known as: Crestor Take 1 tablet (20 mg total) by mouth daily.   senna-docusate 8.6-50 MG tablet Commonly known as: Senokot-S Take 1 tablet by mouth at bedtime as needed for mild constipation. What changed: when to take this   sulfamethoxazole-trimethoprim 800-160 MG tablet Commonly known as: BACTRIM DS Take 1 tablet by mouth every 12 (twelve) hours for 4 days.   Syringe (Disposable) 1 ML Misc Use to draw up 0.80m of testosterone medication   tamsulosin 0.4 MG Caps capsule Commonly known as: FLOMAX Take 0.8 mg by mouth daily after supper.   Testosterone Cypionate 200 MG/ML Soln Inject 120 mg as directed once a week. Saturday   traMADol 50 MG tablet Commonly known as: ULTRAM Take 50 mg by mouth every 4 (  four) hours as needed for moderate pain.   traZODone 50 MG tablet Commonly known as: DESYREL Take 50 mg by mouth at bedtime as needed for sleep.   zolpidem 10 MG tablet Commonly known as: AMBIEN Take 10 mg by mouth at bedtime as needed for sleep.        Follow-up Information     [facility] Follow up.   Specialty: Home Health Services Why: The home health agency will contact you for the first home visit. Contact information: [address] [place] [address] [phone]         Pieter Partridge, DO. Schedule an appointment as soon as possible for a visit in 1 month(s).   Specialty: Neurology Contact information: [place] [phone]                No Known  Allergies  Consultations: Neurology   Procedures/Studies: [place] EKG SITE RITE  Result Date: 05/03/2022 If Site Rite image not attached, placement could not be confirmed due to current cardiac rhythm.  MR BRAIN WO CONTRAST  Result Date: 05/02/2022 CLINICAL DATA:  Follow-up examination for stroke. EXAM: MRI HEAD WITHOUT CONTRAST MRA HEAD WITHOUT CONTRAST MRA NECK WITHOUT CONTRAST TECHNIQUE: Multiplanar, multiecho pulse sequences of the brain and surrounding structures were obtained without intravenous contrast. Angiographic images of the Circle of Willis were obtained using MRA technique without intravenous contrast. Angiographic images of the neck were obtained using MRA technique without intravenous contrast. Carotid stenosis measurements (when applicable) are obtained utilizing NASCET criteria, using the distal internal carotid diameter as the denominator. COMPARISON:  Prior CT from 05/01/2022. FINDINGS: MRI HEAD FINDINGS Brain: Examination degraded by motion artifact. Cerebral volume within normal limits. Patchy T2/FLAIR hyperintensity involving the periventricular and deep white matter both cerebral hemispheres, most consistent with chronic small vessel ischemic disease, mild to moderate in nature. Few scattered superimposed remote lacunar infarcts noted at the left frontal centrum semi ovale. No evidence for acute or subacute ischemia. Gray-white matter differentiation maintained. No areas of chronic cortical infarction. No acute or chronic intracranial blood products. No mass lesion or midline shift. No hydrocephalus or extra-axial fluid collection. Pituitary gland and suprasellar region normal. Vascular: Major intracranial vascular flow voids are maintained. Skull and upper cervical spine: Craniocervical junction within normal limits. Bone marrow signal intensity normal. No scalp soft tissue abnormality. Sinuses/Orbits: Globes and orbital soft tissues within normal limits. Paranasal sinuses are  largely clear. No mastoid effusion. Other: None. MRA HEAD FINDINGS ANTERIOR CIRCULATION: Examination moderately to severely degraded by motion artifact. Visualized distal cervical segments of the internal carotid arteries are widely patent bilaterally. Petrous, cavernous, and supraclinoid segments patent without obvious hemodynamically significant stenosis. Irregularity with possible 8 mm outpouching extending inferiorly from the left ICA terminus (series 1, image 88). While this is favored to be artifactual, a possible aneurysm is difficult to exclude. A1 segments patent. Grossly normal anterior communicating artery complex. Anterior cerebral arteries grossly patent to their distal aspects. No obvious M1 stenosis. No visible proximal MCA branch occlusion. Distal MCA branches grossly perfused and symmetric. POSTERIOR CIRCULATION: Left vertebral artery dominant and patent to the vertebrobasilar junction without stenosis. Left PICA patent. Right vertebral artery hypoplastic and likely terminates in PICA, partially visualized on time-of-flight sequence. Basilar patent to its distal aspect without stenosis. Superior cerebellar arteries patent bilaterally. Both PCAs are patent and well perfused or distal aspects without appreciable stenosis. MRA NECK FINDINGS AORTIC ARCH: Examination severely degraded  by motion artifact and lack of IV contrast. Aortic arch and origin the great vessels not visualized or assessed on this exam. RIGHT CAROTID SYSTEM: Visualized right common and internal carotid arteries patent with antegrade flow. No obvious hemodynamically significant stenosis about the right carotid bulb. LEFT CAROTID SYSTEM: Visualized left common and internal carotid arteries patent with antegrade flow. No obvious hemodynamically significant stenosis about the left carotid bulb. VERTEBRAL ARTERIES: Neither vertebral artery origin is visualized. Left vertebral artery dominant. Vertebral arteries grossly patent within the  neck with antegrade flow. No visible stenosis. IMPRESSION: MRI HEAD IMPRESSION: 1. Motion degraded exam. 2. No acute intracranial abnormality. 3. Mild-to-moderate chronic microvascular ischemic disease with a few scattered remote lacunar infarcts involving the left hemispheric cerebral white matter. MRA HEAD IMPRESSION: 1. Severely motion degraded exam. 2. Grossly negative intracranial MRA. No large vessel occlusion. No obvious hemodynamically significant or correctable stenosis. 3. Irregularity with possible 8 mm outpouching extending inferiorly from the left ICA terminus. While this finding is favored to be artifactual, a possible aneurysm is difficult to exclude. Correlation with dedicated CTA and/or repeat MRA when the patient is able to tolerate the exam suggested for further evaluation. MRA NECK IMPRESSION: 1. Severely motion degraded exam. 2. Grossly patent carotid artery systems and vertebral arteries within the neck. No obvious hemodynamically significant stenosis. 3. Dominant left vertebral artery. Hypoplastic right vertebral artery likely terminates in PICA. Electronically Signed   By: Jeannine Boga M.D.   On: 05/02/2022 05:13   MR ANGIO HEAD WO CONTRAST  Result Date: 05/02/2022 CLINICAL DATA:  Follow-up examination for stroke. EXAM: MRI HEAD WITHOUT CONTRAST MRA HEAD WITHOUT CONTRAST MRA NECK WITHOUT CONTRAST TECHNIQUE: Multiplanar, multiecho pulse sequences of the brain and surrounding structures were obtained without intravenous contrast. Angiographic images of the Circle of Willis were obtained using MRA technique without intravenous contrast. Angiographic images of the neck were obtained using MRA technique without intravenous contrast. Carotid stenosis measurements (when applicable) are obtained utilizing NASCET criteria, using the distal internal carotid diameter as the denominator. COMPARISON:  Prior CT from 05/01/2022. FINDINGS: MRI HEAD FINDINGS Brain: Examination degraded by motion  artifact. Cerebral volume within normal limits. Patchy T2/FLAIR hyperintensity involving the periventricular and deep white matter both cerebral hemispheres, most consistent with chronic small vessel ischemic disease, mild to moderate in nature. Few scattered superimposed remote lacunar infarcts noted at the left frontal centrum semi ovale. No evidence for acute or subacute ischemia. Gray-white matter differentiation maintained. No areas of chronic cortical infarction. No acute or chronic intracranial blood products. No mass lesion or midline shift. No hydrocephalus or extra-axial fluid collection. Pituitary gland and suprasellar region normal. Vascular: Major intracranial vascular flow voids are maintained. Skull and upper cervical spine: Craniocervical junction within normal limits. Bone marrow signal intensity normal. No scalp soft tissue abnormality. Sinuses/Orbits: Globes and orbital soft tissues within normal limits. Paranasal sinuses are largely clear. No mastoid effusion. Other: None. MRA HEAD FINDINGS ANTERIOR CIRCULATION: Examination moderately to severely degraded by motion artifact. Visualized distal cervical segments of the internal carotid arteries are widely patent bilaterally. Petrous, cavernous, and supraclinoid segments patent without obvious hemodynamically significant stenosis. Irregularity with possible 8 mm outpouching extending inferiorly from the left ICA terminus (series 1, image 88). While this is favored to be artifactual, a possible aneurysm is difficult to exclude. A1 segments patent. Grossly normal anterior communicating artery complex. Anterior cerebral arteries grossly patent to their distal aspects. No obvious M1 stenosis. No visible proximal MCA branch occlusion. Distal MCA branches grossly perfused  and symmetric. POSTERIOR CIRCULATION: Left vertebral artery dominant and patent to the vertebrobasilar junction without stenosis. Left PICA patent. Right vertebral artery hypoplastic and  likely terminates in PICA, partially visualized on time-of-flight sequence. Basilar patent to its distal aspect without stenosis. Superior cerebellar arteries patent bilaterally. Both PCAs are patent and well perfused or distal aspects without appreciable stenosis. MRA NECK FINDINGS AORTIC ARCH: Examination severely degraded by motion artifact and lack of IV contrast. Aortic arch and origin the great vessels not visualized or assessed on this exam. RIGHT CAROTID SYSTEM: Visualized right common and internal carotid arteries patent with antegrade flow. No obvious hemodynamically significant stenosis about the right carotid bulb. LEFT CAROTID SYSTEM: Visualized left common and internal carotid arteries patent with antegrade flow. No obvious hemodynamically significant stenosis about the left carotid bulb. VERTEBRAL ARTERIES: Neither vertebral artery origin is visualized. Left vertebral artery dominant. Vertebral arteries grossly patent within the neck with antegrade flow. No visible stenosis. IMPRESSION: MRI HEAD IMPRESSION: 1. Motion degraded exam. 2. No acute intracranial abnormality. 3. Mild-to-moderate chronic microvascular ischemic disease with a few scattered remote lacunar infarcts involving the left hemispheric cerebral white matter. MRA HEAD IMPRESSION: 1. Severely motion degraded exam. 2. Grossly negative intracranial MRA. No large vessel occlusion. No obvious hemodynamically significant or correctable stenosis. 3. Irregularity with possible 8 mm outpouching extending inferiorly from the left ICA terminus. While this finding is favored to be artifactual, a possible aneurysm is difficult to exclude. Correlation with dedicated CTA and/or repeat MRA when the patient is able to tolerate the exam suggested for further evaluation. MRA NECK IMPRESSION: 1. Severely motion degraded exam. 2. Grossly patent carotid artery systems and vertebral arteries within the neck. No obvious hemodynamically significant stenosis. 3.  Dominant left vertebral artery. Hypoplastic right vertebral artery likely terminates in PICA. Electronically Signed   By: Jeannine Boga M.D.   On: 05/02/2022 05:13   MR ANGIO NECK WO CONTRAST  Result Date: 05/02/2022 CLINICAL DATA:  Follow-up examination for stroke. EXAM: MRI HEAD WITHOUT CONTRAST MRA HEAD WITHOUT CONTRAST MRA NECK WITHOUT CONTRAST TECHNIQUE: Multiplanar, multiecho pulse sequences of the brain and surrounding structures were obtained without intravenous contrast. Angiographic images of the Circle of Willis were obtained using MRA technique without intravenous contrast. Angiographic images of the neck were obtained using MRA technique without intravenous contrast. Carotid stenosis measurements (when applicable) are obtained utilizing NASCET criteria, using the distal internal carotid diameter as the denominator. COMPARISON:  Prior CT from 05/01/2022. FINDINGS: MRI HEAD FINDINGS Brain: Examination degraded by motion artifact. Cerebral volume within normal limits. Patchy T2/FLAIR hyperintensity involving the periventricular and deep white matter both cerebral hemispheres, most consistent with chronic small vessel ischemic disease, mild to moderate in nature. Few scattered superimposed remote lacunar infarcts noted at the left frontal centrum semi ovale. No evidence for acute or subacute ischemia. Gray-white matter differentiation maintained. No areas of chronic cortical infarction. No acute or chronic intracranial blood products. No mass lesion or midline shift. No hydrocephalus or extra-axial fluid collection. Pituitary gland and suprasellar region normal. Vascular: Major intracranial vascular flow voids are maintained. Skull and upper cervical spine: Craniocervical junction within normal limits. Bone marrow signal intensity normal. No scalp soft tissue abnormality. Sinuses/Orbits: Globes and orbital soft tissues within normal limits. Paranasal sinuses are largely clear. No mastoid  effusion. Other: None. MRA HEAD FINDINGS ANTERIOR CIRCULATION: Examination moderately to severely degraded by motion artifact. Visualized distal cervical segments of the internal carotid arteries are widely patent bilaterally. Petrous, cavernous, and supraclinoid segments patent without  obvious hemodynamically significant stenosis. Irregularity with possible 8 mm outpouching extending inferiorly from the left ICA terminus (series 1, image 88). While this is favored to be artifactual, a possible aneurysm is difficult to exclude. A1 segments patent. Grossly normal anterior communicating artery complex. Anterior cerebral arteries grossly patent to their distal aspects. No obvious M1 stenosis. No visible proximal MCA branch occlusion. Distal MCA branches grossly perfused and symmetric. POSTERIOR CIRCULATION: Left vertebral artery dominant and patent to the vertebrobasilar junction without stenosis. Left PICA patent. Right vertebral artery hypoplastic and likely terminates in PICA, partially visualized on time-of-flight sequence. Basilar patent to its distal aspect without stenosis. Superior cerebellar arteries patent bilaterally. Both PCAs are patent and well perfused or distal aspects without appreciable stenosis. MRA NECK FINDINGS AORTIC ARCH: Examination severely degraded by motion artifact and lack of IV contrast. Aortic arch and origin the great vessels not visualized or assessed on this exam. RIGHT CAROTID SYSTEM: Visualized right common and internal carotid arteries patent with antegrade flow. No obvious hemodynamically significant stenosis about the right carotid bulb. LEFT CAROTID SYSTEM: Visualized left common and internal carotid arteries patent with antegrade flow. No obvious hemodynamically significant stenosis about the left carotid bulb. VERTEBRAL ARTERIES: Neither vertebral artery origin is visualized. Left vertebral artery dominant. Vertebral arteries grossly patent within the neck with antegrade flow.  No visible stenosis. IMPRESSION: MRI HEAD IMPRESSION: 1. Motion degraded exam. 2. No acute intracranial abnormality. 3. Mild-to-moderate chronic microvascular ischemic disease with a few scattered remote lacunar infarcts involving the left hemispheric cerebral white matter. MRA HEAD IMPRESSION: 1. Severely motion degraded exam. 2. Grossly negative intracranial MRA. No large vessel occlusion. No obvious hemodynamically significant or correctable stenosis. 3. Irregularity with possible 8 mm outpouching extending inferiorly from the left ICA terminus. While this finding is favored to be artifactual, a possible aneurysm is difficult to exclude. Correlation with dedicated CTA and/or repeat MRA when the patient is able to tolerate the exam suggested for further evaluation. MRA NECK IMPRESSION: 1. Severely motion degraded exam. 2. Grossly patent carotid artery systems and vertebral arteries within the neck. No obvious hemodynamically significant stenosis. 3. Dominant left vertebral artery. Hypoplastic right vertebral artery likely terminates in PICA. Electronically Signed   By: Jeannine Boga M.D.   On: 05/02/2022 05:13   ECHOCARDIOGRAM COMPLETE  Result Date: 05/01/2022    ECHOCARDIOGRAM REPORT   Patient Name:   Robert [name] Date of Exam: 05/01/2022 Medical Rec #:  [id number]        Height:       71.0 in Accession #:    [id number]       Weight:       264.6 lb Date of Birth:  1961/05/01        BSA:          2.375 m Patient Age:    61 years         BP:           99/60 mmHg Patient Gender: M                HR:           62 bpm. Exam Location:  Inpatient Procedure: 2D Echo, Cardiac Doppler and Color Doppler Indications:    Stroke  History:        Patient has prior history of Echocardiogram examinations, most                 recent 11/27/2021. TIA, Arrythmias:Atrial Fibrillation and  Tachycardia, Signs/Symptoms:Edema, Fatigue and Dyspnea; Risk                 Factors:Hypertension, Diabetes and  Dyslipidemia. Kidney                 Transplant, S/P AVR with a 70m Edwards inspiris resilia                 prosthetic.  Sonographer:    DWenda LowReferring Phys: 4Cross PlainsComments: Patient is morbidly obese. IMPRESSIONS  1. Left ventricular ejection fraction, by estimation, is 60 to 65%. The left ventricle has normal function. The left ventricle has no regional wall motion abnormalities. There is moderate left ventricular hypertrophy. Left ventricular diastolic parameters are consistent with Grade II diastolic dysfunction (pseudonormalization).  2. Right ventricular systolic function is normal. The right ventricular size is normal. There is normal pulmonary artery systolic pressure.  3. Left atrial size was moderately dilated.  4. Right atrial size was mild to moderately dilated.  5. No evidence of mitral valve regurgitation. Moderate mitral annular calcification.  6. S/p AVR 27 mm Edwards Inspiris Resilia Prosthesis. No significant PVL. Well seated. AV max 2.3 m/s. Mean gradient 11 mmHg. Normal prosthesis. Aortic valve regurgitation is not visualized.  7. There is mild dilatation of the ascending aorta, measuring 39 mm.  8. The inferior vena cava is normal in size with greater than 50% respiratory variability, suggesting right atrial pressure of 3 mmHg. Comparison(s): No significant change from prior study. FINDINGS  Left Ventricle: Left ventricular ejection fraction, by estimation, is 60 to 65%. The left ventricle has normal function. The left ventricle has no regional wall motion abnormalities. The left ventricular internal cavity size was normal in size. There is  moderate left ventricular hypertrophy. Abnormal (paradoxical) septal motion consistent with post-operative status. Left ventricular diastolic parameters are consistent with Grade II diastolic dysfunction (pseudonormalization). Right Ventricle: The right ventricular size is normal. Right vetricular wall thickness  was not well visualized. Right ventricular systolic function is normal. There is normal pulmonary artery systolic pressure. The tricuspid regurgitant velocity is 2.62 m/s, and with an assumed right atrial pressure of 3 mmHg, the estimated right ventricular systolic pressure is 362.0mmHg. Left Atrium: Left atrial size was moderately dilated. Right Atrium: Right atrial size was mild to moderately dilated. Pericardium: There is no evidence of pericardial effusion. Mitral Valve: Moderate mitral annular calcification. No evidence of mitral valve regurgitation. MV peak gradient, 9.2 mmHg. The mean mitral valve gradient is 4.0 mmHg. Tricuspid Valve: The tricuspid valve is normal in structure. Tricuspid valve regurgitation is trivial. Aortic Valve: S/p AVR 27 mm Edwards Inspiris Resilia Prosthesis. No significant PVL. Well seated. AV max 2.3 m/s. Mean gradient 11 mmHg. Normal prosthesis. Aortic valve regurgitation is not visualized. Aortic valve mean gradient measures 11.0 mmHg. Aortic valve peak gradient measures 22.2 mmHg. Aortic valve area, by VTI measures 2.73 cm. Pulmonic Valve: The pulmonic valve was not well visualized. Pulmonic valve regurgitation is not visualized. Aorta: There is mild dilatation of the ascending aorta, measuring 39 mm. Venous: The inferior vena cava is normal in size with greater than 50% respiratory variability, suggesting right atrial pressure of 3 mmHg.  LEFT VENTRICLE PLAX 2D LVIDd:         5.05 cm     Diastology LVIDs:         3.20 cm     LV e' medial:    5.55 cm/s LV PW:  1.65 cm     LV E/e' medial:  24.5 LV IVS:        1.55 cm     LV e' lateral:   8.38 cm/s LVOT diam:     2.10 cm     LV E/e' lateral: 16.2 LV SV:         137 LV SV Index:   58 LVOT Area:     3.46 cm  LV Volumes (MOD) LV vol d, MOD A2C: 59.5 ml LV vol d, MOD A4C: 83.5 ml LV vol s, MOD A2C: 13.9 ml LV vol s, MOD A4C: 21.4 ml LV SV MOD A2C:     45.6 ml LV SV MOD A4C:     83.5 ml LV SV MOD BP:      53.3 ml RIGHT  VENTRICLE RV Basal diam:  5.60 cm RV Mid diam:    4.30 cm RV S prime:     14.10 cm/s TAPSE (M-mode): 3.2 cm LEFT ATRIUM              Index        RIGHT ATRIUM           Index LA diam:        4.90 cm  2.06 cm/m   RA Area:     32.00 cm LA Vol (A2C):   135.0 ml 56.85 ml/m  RA Volume:   126.00 ml 53.06 ml/m LA Vol (A4C):   131.0 ml 55.16 ml/m LA Biplane Vol: 135.0 ml 56.85 ml/m  AORTIC VALVE                     PULMONIC VALVE AV Area (Vmax):    2.60 cm      PV Vmax:       0.93 m/s AV Area (Vmean):   2.51 cm      PV Peak grad:  3.5 mmHg AV Area (VTI):     2.73 cm AV Vmax:           235.50 cm/s AV Vmean:          149.000 cm/s AV VTI:            0.500 m AV Peak Grad:      22.2 mmHg AV Mean Grad:      11.0 mmHg LVOT Vmax:         177.00 cm/s LVOT Vmean:        108.000 cm/s LVOT VTI:          0.395 m LVOT/AV VTI ratio: 0.79  AORTA Ao Root diam: 3.80 cm Ao Asc diam:  3.90 cm MITRAL VALVE                TRICUSPID VALVE MV Area (PHT): 2.97 cm     TR Peak grad:   27.5 mmHg MV Area VTI:   2.26 cm     TR Vmax:        262.00 cm/s MV Peak grad:  9.2 mmHg MV Mean grad:  4.0 mmHg     SHUNTS MV Vmax:       1.52 m/s     Systemic VTI:  0.40 m MV Vmean:      90.7 cm/s    Systemic Diam: 2.10 cm MV Decel Time: 255 msec MV E velocity: 136.00 cm/s MV A velocity: 133.00 cm/s MV E/A ratio:  1.02 Placido Sou signed by Phineas Inches Signature Date/Time: 05/01/2022/4:15:36 PM    Final    CT HEAD  CODE STROKE WO CONTRAST  Result Date: 05/01/2022 CLINICAL DATA:  Code stroke. Neuro deficit, acute, stroke suspected. EXAM: CT HEAD WITHOUT CONTRAST TECHNIQUE: Contiguous axial images were obtained from the base of the skull through the vertex without intravenous contrast. RADIATION DOSE REDUCTION: This exam was performed according to the departmental dose-optimization program which includes automated exposure control, adjustment of the mA and/or kV according to patient size and/or use of iterative reconstruction technique.  COMPARISON:  Head CT May 01, 2022 at 12:25 a.m. FINDINGS: Brain: No evidence of acute infarction, hemorrhage, hydrocephalus, extra-axial collection or mass lesion/mass effect. Remote lacunar infarcts in the left centrum semiovale and deep white matter of the left frontal lobe. Vascular: Calcified plaques in the bilateral vertebral arteries and carotid siphons. Skull: Normal. Negative for fracture or focal lesion. Sinuses/Orbits: No acute finding. Other: None. ASPECTS [facility] Stroke Program Early CT Score) - Ganglionic level infarction (caudate, lentiform nuclei, internal capsule, insula, M1-M3 cortex): 7 - Supraganglionic infarction (M4-M6 cortex): 3 Total score (0-10 with 10 being normal): 10 IMPRESSION: 1. No acute intracranial abnormality. No significant change from prior CT. 2. ASPECTS is 10. These results were transmitted via [employer] page system at the time of interpretation on 05/01/2022 at 10:46 am to provider Dr. Leonel Ramsay. Electronically Signed   By: Pedro Earls M.D.   On: 05/01/2022 10:47   CT Head Wo Contrast  Result Date: 05/01/2022 CLINICAL DATA:  Neuro deficit, prior stroke EXAM: CT HEAD WITHOUT CONTRAST TECHNIQUE: Contiguous axial images were obtained from the base of the skull through the vertex without intravenous contrast. RADIATION DOSE REDUCTION: This exam was performed according to the departmental dose-optimization program which includes automated exposure control, adjustment of the mA and/or kV according to patient size and/or use of iterative reconstruction technique. COMPARISON:  11/24/2021 FINDINGS: Brain: No evidence of acute infarction, hemorrhage, hydrocephalus, extra-axial collection or mass lesion/mass effect. Old lacunar infarcts in the subcortical left frontal lobe. Vascular: Intracranial atherosclerosis. Skull: Normal. Negative for fracture or focal lesion. Sinuses/Orbits: The visualized paranasal sinuses are essentially clear. The mastoid air cells are  unopacified. Other: None. IMPRESSION: No acute intracranial abnormality. Old lacunar infarcts in the subcortical left frontal lobe. Electronically Signed   By: Julian Hy M.D.   On: 05/01/2022 00:43   CT Renal Stone Study  Result Date: 04/30/2022 CLINICAL DATA:  Flank pain, hematuria EXAM: CT ABDOMEN AND PELVIS WITHOUT CONTRAST TECHNIQUE: Multidetector CT imaging of the abdomen and pelvis was performed following the standard protocol without IV contrast. RADIATION DOSE REDUCTION: This exam was performed according to the departmental dose-optimization program which includes automated exposure control, adjustment of the mA and/or kV according to patient size and/or use of iterative reconstruction technique. COMPARISON:  11/09/2021 FINDINGS: Lower chest: Lung bases are clear. Hepatobiliary: Unenhanced liver is unremarkable. Layering gallbladder sludge. No intrahepatic or extrahepatic duct dilatation. Pancreas: Within normal limits. Spleen: Within normal limits. Adrenals/Urinary Tract: Adrenal glands are within normal limits. Severe atrophy of the native left kidney. Mild atrophy of the native right kidney. No renal calculi or hydronephrosis. Right lower quadrant renal transplant with gas in right renal collecting system. No hydronephrosis. Nondependent gas in the bladder. Stomach/Bowel: Stomach is within normal limits. No evidence of bowel obstruction. Normal appendix (series 3/image 54). Mild left colonic diverticulosis, without evidence of diverticulitis. Vascular/Lymphatic: No evidence of abdominal aortic aneurysm. Atherosclerotic calcifications of the abdominal aorta and branch vessels. No suspicious abdominopelvic lymphadenopathy. Reproductive: Prostate is unremarkable. Other: Small fat containing left inguinal hernia (series 3/image 85). Musculoskeletal:  Degenerative changes of the visualized thoracolumbar spine. IMPRESSION: Right lower quadrant renal transplant with gas in the right renal collecting  system and bladder. Correlate for recent intervention/catheterization. Otherwise, this appearance suggests cystitis with ascending infection. No renal, ureteral, or bladder calculi. No hydronephrosis. Additional ancillary findings as above. Electronically Signed   By: Julian Hy M.D.   On: 04/30/2022 20:02   (Echo, Carotid, EGD, Colonoscopy, ERCP)    Subjective: No complaints  Discharge Exam: Vitals:   05/03/22 0354 05/03/22 0806  BP: (!) 154/65 (!) 156/70  Pulse: 61 62  Resp: 20 18  Temp: 97.8 F (36.6 C) 98.4 F (36.9 C)  SpO2: 99% 100%   Vitals:   05/02/22 2013 05/03/22 0354 05/03/22 0356 05/03/22 0806  BP: (!) 158/65 (!) 154/65  (!) 156/70  Pulse: 71 61  62  Resp: '18 20  18  '$ Temp: 98.2 F (36.8 C) 97.8 F (36.6 C)  98.4 F (36.9 C)  TempSrc: Oral Axillary  Oral  SpO2: 99% 99%  100%  Weight:   130 kg   Height:        General: Pt is alert, awake, not in acute distress Cardiovascular: RRR, S1/S2 +, no rubs, no gallops Respiratory: CTA bilaterally, no wheezing, no rhonchi Abdominal: Soft, NT, ND, bowel sounds + Extremities: no edema, no cyanosis    The results of significant diagnostics from this hospitalization (including imaging, microbiology, ancillary and laboratory) are listed below for reference.     Microbiology: Recent Results (from the past 240 hour(s))  Urine Culture     Status: Abnormal   Collection Time: 04/30/22 12:43 PM   Specimen: Urine, Clean Catch  Result Value Ref Range Status   Specimen Description URINE, CLEAN CATCH  Final   Special Requests   Final    NONE Performed at [facility], [address] [place], [place] [address]    Culture (A)  Final    >=100,000 COLONIES/mL KLEBSIELLA PNEUMONIAE Confirmed Extended Spectrum Beta-Lactamase Producer (ESBL).  In bloodstream infections from ESBL organisms, carbapenems are preferred over piperacillin/tazobactam. They are shown to have a lower risk of mortality.    Report Status  05/02/2022 FINAL  Final   Organism ID, Bacteria KLEBSIELLA PNEUMONIAE (A)  Final      Susceptibility   Klebsiella pneumoniae - MIC*    AMPICILLIN >=32 RESISTANT Resistant     CEFAZOLIN >=64 RESISTANT Resistant     CEFEPIME 1 SENSITIVE Sensitive     CEFTRIAXONE 4 RESISTANT Resistant     CIPROFLOXACIN 1 RESISTANT Resistant     GENTAMICIN <=1 SENSITIVE Sensitive     IMIPENEM <=0.25 SENSITIVE Sensitive     NITROFURANTOIN 64 INTERMEDIATE Intermediate     TRIMETH/SULFA <=20 SENSITIVE Sensitive     AMPICILLIN/SULBACTAM 8 SENSITIVE Sensitive     PIP/TAZO <=4 SENSITIVE Sensitive     * >=100,000 COLONIES/mL KLEBSIELLA PNEUMONIAE  Blood culture (routine x 2)     Status: None (Preliminary result)   Collection Time: 04/30/22 10:36 PM   Specimen: BLOOD  Result Value Ref Range Status   Specimen Description BLOOD LEFT ANTECUBITAL  Final   Special Requests   Final    BOTTLES DRAWN AEROBIC AND ANAEROBIC Blood Culture adequate volume   Culture   Final    NO GROWTH 3 DAYS Performed at [facility], [address] [place], [place] [address]    Report Status PENDING  Incomplete  Blood culture (routine x 2)     Status: None (Preliminary result)  Collection Time: 05/01/22  3:54 AM   Specimen: BLOOD LEFT HAND  Result Value Ref Range Status   Specimen Description BLOOD LEFT HAND  Final   Special Requests   Final    BOTTLES DRAWN AEROBIC AND ANAEROBIC Blood Culture results may not be optimal due to an excessive volume of blood received in culture bottles   Culture   Final    NO GROWTH 2 DAYS Performed at [facility], [place] [address] [place], [place] [address]    Report Status PENDING  Incomplete     Labs: BNP (last 3 results) Recent Labs    11/11/21 0104 11/12/21 0151 11/13/21 0449  BNP 667.8* 629.2* 329.5*   Basic Metabolic Panel: Recent Labs  Lab 04/30/22 1414 05/01/22 0354  NA 138 137  K 3.5 3.4*  CL 103 103  CO2 25 25  GLUCOSE 81 119*  BUN 38* 41*  CREATININE  2.21* 2.16*  CALCIUM 10.4* 9.6  MG  --  2.3   Liver Function Tests: Recent Labs  Lab 05/01/22 0354  AST 15  ALT 13  ALKPHOS 59  BILITOT 0.5  PROT 5.8*  ALBUMIN 3.5   No results for input(s): "LIPASE", "AMYLASE" in the last 168 hours. No results for input(s): "AMMONIA" in the last 168 hours. CBC: Recent Labs  Lab 04/30/22 1414 05/01/22 0354 05/02/22 0947  WBC 8.4 7.5 7.6  NEUTROABS  --  5.0  --   HGB 12.7* 11.9* 11.8*  HCT 39.3 36.4* 36.0*  MCV 87.1 86.1 86.3  PLT 247 222 213   Cardiac Enzymes: No results for input(s): "CKTOTAL", "CKMB", "CKMBINDEX", "TROPONINI" in the last 168 hours. BNP: Invalid input(s): "POCBNP" CBG: Recent Labs  Lab 05/02/22 1144 05/02/22 2125 05/02/22 2328 05/03/22 0018 05/03/22 0628  GLUCAP 148* 82 61* 93 144*   D-Dimer No results for input(s): "DDIMER" in the last 72 hours. Hgb A1c Recent Labs    05/02/22 0947  HGBA1C 7.4*   Lipid Profile Recent Labs    05/02/22 0334  CHOL 116  HDL 52  LDLCALC 55  TRIG 45  CHOLHDL 2.2   Thyroid function studies No results for input(s): "TSH", "T4TOTAL", "T3FREE", "THYROIDAB" in the last 72 hours.  Invalid input(s): "FREET3" Anemia work up No results for input(s): "VITAMINB12", "FOLATE", "FERRITIN", "TIBC", "IRON", "RETICCTPCT" in the last 72 hours. Urinalysis    Component Value Date/Time   COLORURINE YELLOW 04/30/2022 1243   APPEARANCEUR HAZY (A) 04/30/2022 1243   LABSPEC 1.010 04/30/2022 1243   PHURINE 6.0 04/30/2022 1243   GLUCOSEU NEGATIVE 04/30/2022 1243   GLUCOSEU NEGATIVE 08/03/2019 1006   HGBUR MODERATE (A) 04/30/2022 1243   BILIRUBINUR NEGATIVE 04/30/2022 1243   KETONESUR NEGATIVE 04/30/2022 1243   PROTEINUR 100 (A) 04/30/2022 1243   UROBILINOGEN 0.2 08/03/2019 1006   NITRITE NEGATIVE 04/30/2022 1243   LEUKOCYTESUR SMALL (A) 04/30/2022 1243   Sepsis Labs Recent Labs  Lab 04/30/22 1414 05/01/22 0354 05/02/22 0947  WBC 8.4 7.5 7.6   Microbiology Recent Results  (from the past 240 hour(s))  Urine Culture     Status: Abnormal   Collection Time: 04/30/22 12:43 PM   Specimen: Urine, Clean Catch  Result Value Ref Range Status   Specimen Description URINE, CLEAN CATCH  Final   Special Requests   Final    NONE Performed at [facility], [place] [address] [place], [place] [address]    Culture (A)  Final    >=100,000 COLONIES/mL KLEBSIELLA PNEUMONIAE Confirmed Extended Spectrum Beta-Lactamase  Producer (ESBL).  In bloodstream infections from ESBL organisms, carbapenems are preferred over piperacillin/tazobactam. They are shown to have a lower risk of mortality.    Report Status 05/02/2022 FINAL  Final   Organism ID, Bacteria KLEBSIELLA PNEUMONIAE (A)  Final      Susceptibility   Klebsiella pneumoniae - MIC*    AMPICILLIN >=32 RESISTANT Resistant     CEFAZOLIN >=64 RESISTANT Resistant     CEFEPIME 1 SENSITIVE Sensitive     CEFTRIAXONE 4 RESISTANT Resistant     CIPROFLOXACIN 1 RESISTANT Resistant     GENTAMICIN <=1 SENSITIVE Sensitive     IMIPENEM <=0.25 SENSITIVE Sensitive     NITROFURANTOIN 64 INTERMEDIATE Intermediate     TRIMETH/SULFA <=20 SENSITIVE Sensitive     AMPICILLIN/SULBACTAM 8 SENSITIVE Sensitive     PIP/TAZO <=4 SENSITIVE Sensitive     * >=100,000 COLONIES/mL KLEBSIELLA PNEUMONIAE  Blood culture (routine x 2)     Status: None (Preliminary result)   Collection Time: 04/30/22 10:36 PM   Specimen: BLOOD  Result Value Ref Range Status   Specimen Description BLOOD LEFT ANTECUBITAL  Final   Special Requests   Final    BOTTLES DRAWN AEROBIC AND ANAEROBIC Blood Culture adequate volume   Culture   Final    NO GROWTH 3 DAYS Performed at [facility], [address] [place], [place] [address]    Report Status PENDING  Incomplete  Blood culture (routine x 2)     Status: None (Preliminary result)   Collection Time: 05/01/22  3:54 AM   Specimen: BLOOD LEFT HAND  Result Value Ref Range Status   Specimen Description BLOOD LEFT  HAND  Final   Special Requests   Final    BOTTLES DRAWN AEROBIC AND ANAEROBIC Blood Culture results may not be optimal due to an excessive volume of blood received in culture bottles   Culture   Final    NO GROWTH 2 DAYS Performed at [facility], [place] [address] [place], [place] [address]    Report Status PENDING  Incomplete     Time coordinating discharge: Over 30 minutes  SIGNED:   Charlynne Cousins, MD  [facility] 05/03/2022, 11:32 AM Pager   If 7PM-7AM, please contact night-coverage [email or web address] Password [email or web address]

## 2022-05-03 NOTE — Progress Notes (Signed)
SLP Cancellation Note  Patient Details Name: ABHIJOT STRAUGHTER MRN: [id number] DOB: 20-Apr-1961   Cancelled treatment:       Reason Eval/Treat Not Completed: SLP screened, no needs identified, will sign off. MRI is negative   Myleen Brailsford, Katherene Ponto 05/03/2022, 9:15 AM

## 2022-05-05 LAB — CULTURE, BLOOD (ROUTINE X 2)
Culture: NO GROWTH
Special Requests: ADEQUATE

## 2022-05-06 LAB — CULTURE, BLOOD (ROUTINE X 2): Culture: NO GROWTH

## 2022-05-09 ENCOUNTER — Encounter: Payer: 59 | Admitting: Nutrition

## 2022-05-15 ENCOUNTER — Ambulatory Visit: Payer: 59 | Attending: Family Medicine | Admitting: Physical Therapy

## 2022-05-15 DIAGNOSIS — R2681 Unsteadiness on feet: Secondary | ICD-10-CM | POA: Insufficient documentation

## 2022-05-15 DIAGNOSIS — R42 Dizziness and giddiness: Secondary | ICD-10-CM | POA: Insufficient documentation

## 2022-05-15 DIAGNOSIS — R2689 Other abnormalities of gait and mobility: Secondary | ICD-10-CM | POA: Insufficient documentation

## 2022-05-16 ENCOUNTER — Other Ambulatory Visit: Payer: Self-pay

## 2022-05-16 ENCOUNTER — Encounter: Payer: Self-pay | Admitting: Physical Therapy

## 2022-05-16 ENCOUNTER — Ambulatory Visit: Payer: 59 | Admitting: Physical Therapy

## 2022-05-16 DIAGNOSIS — R2689 Other abnormalities of gait and mobility: Secondary | ICD-10-CM

## 2022-05-16 DIAGNOSIS — R42 Dizziness and giddiness: Secondary | ICD-10-CM | POA: Diagnosis present

## 2022-05-16 DIAGNOSIS — R2681 Unsteadiness on feet: Secondary | ICD-10-CM

## 2022-05-16 NOTE — Therapy (Signed)
OUTPATIENT PHYSICAL THERAPY VESTIBULAR EVALUATION     Patient Name: Robert [name] MRN: [id number] DOB:[birth year], 61 y.o., male Today's Date: 05/16/2022  PCP: Haywood Pao, MD REFERRING PROVIDER: Geoffery Lyons, NP   PT End of Session - 05/16/22 1322     Visit Number 1    Number of Visits 17    Date for PT Re-Evaluation 07/11/22    Authorization Type [employer]    PT Start Time 1229    PT Stop Time 1320    PT Time Calculation (min) 51 min    Equipment Utilized During Treatment Gait belt    Activity Tolerance Patient tolerated treatment well    Behavior During Therapy WFL for tasks assessed/performed             Past Medical History:  Diagnosis Date   Acute cystitis without hematuria 11/09/2021   AKI (acute kidney injury) 08/08/2017   Elevated sCr to 2.5 (baseline ~1.7) noted at OSH - Subsequent transfer to [facility]  - Currently on IVMF in the setting of diarrhea and AKI - sCr at baseline at time of discharge   Anemia in chronic kidney disease 03/30/2015   Anticoagulated on Coumadin 11/09/2021   Aortic valve stenosis    Arthritis    Atrial fibrillation 11/29/2021   Benign essential hypertension 07/30/2014   prior to transplant pt took Metorpolol tartrate '50mg'$  qd on off HD days   Benign neoplasm of transverse colon    Cancer    skin ca right shoulder, plastic dsyplasia, pre-Ca polpys removed on Colonoscopy- 07/2014   Cerebrovascular accident (CVA) due to embolism of precerebral artery    Chronic renal failure    Colon polyps 07/23/2014   Tubular adenomas x 5   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   Diverticulosis of colon without hemorrhage    Eczema    ESRD (end stage renal disease)    hemodialysis 06/2014-02/2017, s/p living unrelated  kidney transplant 02/26/17   Exertional dyspnea    Fatigue 01/08/2014   Fissure of skin 03/08/2020   GI bleed 07/26/2017   Headache    Heart murmur    aortic stenosis (moderate-severe 04/2021)   History of COVID-19 2023   History of unilateral nephrectomy 09/06/2013   Hydrocele, acquired 04/07/2017   Hyperlipidemia 09/06/2013   Hypertensive urgency 10/21/2021   Hyponatremia 11/09/2021   Hypothyroidism    Immunosuppressed status 02/26/2017   Induction agent: Solumedrol - Envarsus '3mg'$  daily  - Home dose decreased from '5mg'$  to '3mg'$  in the setting of FK 15.7 while inpatient   - Previously on Prograf, though d/c'ed during previous admission; suspected this may be causing hallucinations  - Cellcept '1000mg'$  Q12H  - Prednisone '5mg'$  daily   Inguinal hernia without obstruction or gangrene 06/30/2017   Right inguinal wall defect appreciated by CT imaging [place] and on exam Dr. Paulla Fore today Dr. Paulla Fore has discussed hernia repair with mesh at some point in near future  Last Assessment & Plan:  Formatting of this note might be different from the original. Right inguinal wall defect appreciated by CT imaging [place] and on exam Dr. Paulla Fore today Dr. Paulla Fore has discussed hern   Kidney transplanted 02/26/2017   Date of Transplant = 02/26/17  ABO (Donor/Recipient) = compatible Blood type: O+/A+ DonorType:  Living donor, unrelated Allograft type:Left kidney Donor anatomy: 2 arteries, 1 veins and single ureter  Pants-to-skirt anastomosis of dual renal arteries on the backtable. Donor Kidney Bx: Not applicable Allograft injury/complications: None Ne   Lower extremity edema 09/06/2013   Macular degeneration    Mild vascular neurocognitive disorder 02/08/2022   OAB (overactive bladder) 12/28/2019   Obstructive sleep apnea 09/06/2013   On Bi-PAP  Last Assessment & Plan:  Formatting of this note might be different from the original. - History of OSA, continue CPAP at night   Peritoneal dialysis catheter in place 02/24/2015    RLQ abdominal pain 04/19/2020   S/P AVR (aortic valve replacement) 10/05/2021   SBO (small bowel obstruction) 05/2015   Scrotal edema 03/14/2017   Testicular ultrasound did show hydrocele  Will refer to urology regarding ongoign swelling, hydrocele.   Last Assessment & Plan:  Formatting of this note might be different from the original.   Testicular ultrasound did show hydrocele  Will refer to urology regarding ongoign swelling, hydrocele.   Shortness of breath    with exertion   Stage 3b chronic kidney disease (CKD) 03/22/2014   SVT (supraventricular tachycardia) 09/06/2013   Tremor of both hands 06/29/2017   Last Assessment & Plan:  Present since transplant, stable and unchanged Query from tacrolimus -Improved tremors noted and verbalized by pt - Prograf doses titrated per FK levels  Last Assessment & Plan:  Formatting of this note might be different from the original. Present since transplant, stable and unchanged Query from tacrolimus -Improved tremors noted and verbalized by pt - Prograf doses titr   Type 2 diabetes mellitus with hyperglycemia, with long-term current use of insulin 18/56/3149   Umbilical hernia    s/p repair 04/28/15   Past Surgical History:  Procedure Laterality Date   AORTIC VALVE REPLACEMENT N/A 10/05/2021   Procedure: AORTIC VALVE REPLACEMENT (AVR) WITH INSPIRIS RESILIA AORTIC VALVE SIZE 27MM;  Surgeon: [name], MD;  Location: [place];  Service: Open Heart Surgery;  Laterality: N/A;   AV FISTULA PLACEMENT Right 02/25/2015   Procedure: RIGHT ARTERIOVENOUS (AV) FISTULA CREATION;  Surgeon: Serafina Mitchell, MD;  Location: [place];  Service: Vascular;  Laterality: Right;   AV FISTULA PLACEMENT Right 09/15/2015   Procedure: ARTERIOVENOUS (AV) FISTULA CREATION- RIGHT ARM;  Surgeon: Serafina Mitchell, MD;  Location: [place];  Service: Vascular;  Laterality: Right;   CAPD INSERTION N/A 05/18/2014   Procedure: LAPAROSCOPIC INSERTION CONTINUOUS AMBULATORY PERITONEAL DIALYSIS   (CAPD) CATHETER;  Surgeon: Ralene Ok, MD;  Location: [place];  Service: General;  Laterality: N/A;   COLONOSCOPY     COLONOSCOPY WITH PROPOFOL N/A 07/12/2016   Procedure: COLONOSCOPY WITH PROPOFOL;  Surgeon: [name], MD;  Location: [facility];  Service: Endoscopy;  Laterality: N/A;   CORONARY ARTERY BYPASS GRAFT N/A 10/05/2021   Procedure: CORONARY ARTERY BYPASS GRAFTING (CABG) X 1, ON PUMP, USING LEFT INTERNAL MAMMARY ARTERY CONDUIT;  Surgeon: [name], MD;  Location: [facility];  Service: Open Heart Surgery;  Laterality: N/A;   declotting of fistula  08/2015   ESOPHAGOGASTRODUODENOSCOPY (EGD) WITH PROPOFOL N/A 07/12/2016   Procedure: ESOPHAGOGASTRODUODENOSCOPY (EGD) WITH PROPOFOL;  Surgeon: [name], MD;  Location: [facility];  Service: Endoscopy;  Laterality: N/A;   FISTULA SUPERFICIALIZATION Right 02/07/2016   Procedure: RIIGHT UPPER ARM FISTULA SUPERFICIALIZATION;  Surgeon: Elam Dutch, MD;  Location: [place];  Service: Vascular;  Laterality: Right;   IJ catheter insertion     INSERTION  OF DIALYSIS CATHETER Right 02/25/2015   Procedure: INSERTION OF RIGHT INTERNAL JUGULAR DIALYSIS CATHETER;  Surgeon: Serafina Mitchell, MD;  Location: [place];  Service: Vascular;  Laterality: Right;   KIDNEY TRANSPLANT     LAPAROSCOPIC REPOSITIONING CAPD CATHETER N/A 06/16/2014   Procedure: LAPAROSCOPIC REPOSITIONING CAPD CATHETER;  Surgeon: Ralene Ok, MD;  Location: [place];  Service: General;  Laterality: N/A;   LAPAROSCOPY N/A 05/04/2015   Procedure: LAPAROSCOPY DIAGNOSTIC LYSIS OF ADHESIONS;  Surgeon: Coralie Keens, MD;  Location: [place];  Service: General;  Laterality: N/A;   MINOR REMOVAL OF PERITONEAL DIALYSIS CATHETER N/A 04/28/2015   Procedure:  REMOVAL OF PERITONEAL DIALYSIS CATHETER;  Surgeon: Coralie Keens, MD;  Location: [place];  Service: General;  Laterality: N/A;   NEPHRECTOMY Left 1974   RENAL BIOPSY Right 2012   REVISON OF ARTERIOVENOUS FISTULA Right 05/26/2015    Procedure: SUPERFICIALIZATION OF ARTERIOVENOUS FISTULA WITH SIDE BRANCH LIGATIONS;  Surgeon: Serafina Mitchell, MD;  Location: [facility];  Service: Vascular;  Laterality: Right;   RIGHT/LEFT HEART CATH AND CORONARY ANGIOGRAPHY N/A 08/22/2021   Procedure: RIGHT/LEFT HEART CATH AND CORONARY ANGIOGRAPHY;  Surgeon: Troy Sine, MD;  Location: [place] [facility];  Service: Cardiovascular;  Laterality: N/A;   SKIN CANCER EXCISION     right shoulder   SVT ABLATION N/A 11/23/2016   Procedure: SVT Ablation;  Surgeon: Will Meredith Leeds, MD;  Location: [place] [facility];  Service: Cardiovascular;  Laterality: N/A;   TEE WITHOUT CARDIOVERSION N/A 10/05/2021   Procedure: TRANSESOPHAGEAL ECHOCARDIOGRAM (TEE);  Surgeon: [name], MD;  Location: [place];  Service: Open Heart Surgery;  Laterality: N/A;   UMBILICAL HERNIA REPAIR N/A 05/18/2014   Procedure: HERNIA REPAIR UMBILICAL ADULT;  Surgeon: Ralene Ok, MD;  Location: [place];  Service: General;  Laterality: N/A;   UMBILICAL HERNIA REPAIR N/A 04/28/2015   Procedure: UMBILICAL HERNIA REPAIR WITH MESH;  Surgeon: Coralie Keens, MD;  Location: [place];  Service: General;  Laterality: N/A;   Patient Active Problem List   Diagnosis Date Noted   TIA (transient ischemic attack) 05/01/2022   Pyelonephritis 04/30/2022   History of ESBL Klebsiella pneumoniae infection 04/30/2022   DNR (do not resuscitate)/DNI(Do Not Intubate) 04/30/2022   Mild vascular neurocognitive disorder 02/08/2022   Atrial fibrillation 11/29/2021   Acute cystitis without hematuria 11/09/2021   Hyponatremia 11/09/2021   Cerebrovascular accident (CVA) due to embolism of precerebral artery    History of COVID-19 2023   S/P AVR (aortic valve replacement) 10/05/2021   Exertional dyspnea    Combined arterial insufficiency and corporo-venous occlusive erectile dysfunction 12/28/2019   OAB (overactive bladder) 12/28/2019   Inguinal hernia without obstruction or gangrene 06/30/2017    Tremor of both hands 06/29/2017   Hydrocele, acquired 04/07/2017   Scrotal edema 03/14/2017   Diabetic neuropathy 03/01/2017   Kidney transplanted 02/26/2017   Immunosuppressed status 02/26/2017   Diverticulosis of colon without hemorrhage    Morbid obesity ([place]) 02/03/2016   Type 2 diabetes mellitus with hyperglycemia, with long-term current use of insulin 07/14/2015   Chronic renal failure    Anemia in chronic kidney disease 03/30/2015   Benign essential hypertension 07/30/2014   Stage 3b chronic kidney disease (CKD) 03/22/2014   Fatigue 01/08/2014   History of unilateral nephrectomy 09/06/2013   SVT (supraventricular tachycardia) ([place]) 09/06/2013   Lower extremity edema 09/06/2013   Obstructive sleep apnea 09/06/2013   Hypothyroidism 09/06/2013   Hyperlipidemia 09/06/2013    ONSET DATE: 05/01/22  REFERRING  DIAG: R42 (ICD-10-CM) - Dizziness and giddiness  THERAPY DIAG:  Dizziness and giddiness  Unsteadiness on feet  Other abnormalities of gait and mobility  Rationale for Evaluation and Treatment Rehabilitation  SUBJECTIVE:   SUBJECTIVE STATEMENT: Patient reports dizziness first started after he experienced a stroke in Dec 2022. Reports that symptoms are unchanged since then. Has episodes that last 10-15 minutes and believes they are not provoked by any event or stimulus. Describes episodes as "off balance, spinning." Denies hearing loss, photo/phonophobia, migraines. Reports that he has a lot of falls and trips on rugs and in the yard. Estimates that last fall with head trauma occurred a month ago. Reports hospitalization recently for a UTI. Reports blurred vision, double vision and HA when he reads. Reports tinnitus "as long as I can remember" B. Reports that he has been in and out of the hospital since June with UTIs and has had more falls since then, but no change in dizziness. Notes that he now ambulates with a SPC; did use RW after his last surgery but his wife did not  like to be seen with him using the walker.   Pt accompanied by: self  PERTINENT HISTORY: a-fib, HTN (hx HTN urgency), CVA, CAD, diapetic neuropathy, ESRD, HLD, kidney transplant 2018, macular degeneration, mild vascular neurocognitive disorder, SVT, DMII, AV fistula, CABG 9833, embolic CVA subcortical L frontal lobe with [name] residual weakness.   PAIN:  Are you having pain? Yes: NPRS scale: 2/10 Pain location: R knee Pain description: dull Aggravating factors: walking, standing Relieving factors: rest  PRECAUTIONS: Fall  WEIGHT BEARING RESTRICTIONS No  FALLS: Has patient fallen in last 6 months? Yes. Number of falls reports 1 fall every week or so since June   LIVING ENVIRONMENT: Lives with: lives with their spouse Lives in: House/apartment Stairs:  3 with 1 handrail; lives on 1st floor Has following equipment at home: Single point cane, Environmental consultant - 2 wheeled, Electronics engineer, and Grab bars  PLOF: Independent  PATIENT GOALS improve dizziness  OBJECTIVE:   Vitals: 142/78 mmHg 98%, 71bpm  DIAGNOSTIC FINDINGS:  05/01/22 head MRI: Mild-to-moderate chronic microvascular ischemic disease with a few scattered remote lacunar infarcts involving the left hemispheric cerebral white matter.  05/01/22 head MRA: Irregularity with possible 8 mm outpouching extending inferiorly from the left ICA terminus. While this finding is favored to be artifactual, a possible aneurysm is difficult to exclude  05/01/22 neck MRA: Grossly patent carotid artery systems and vertebral arteries within the neck. No obvious hemodynamically significant stenosis.   COGNITION: Overall cognitive status: Within functional limits for tasks assessed   SENSATION: Reports limited sensation in B hands/feet  POSTURE: rounded shoulders, forward head, and frequently bending forward/readjusting in seat; slightly SOB which patient reports is normal for him  GAIT: Gait pattern:  Gait very slow and cautious with L toe  out Assistive device utilized: Single point cane  PATIENT SURVEYS:  FOTO [phone]   VESTIBULAR ASSESSMENT   GENERAL OBSERVATION: patient is wearing progressive lenses which he wears most time      OCULOMOTOR EXAM:   Ocular Alignment: normal; L orbit slightly elevated    Ocular ROM: No Limitations   Spontaneous Nystagmus: absent   Gaze-Induced Nystagmus: absent   Smooth Pursuits: saccades and superiorly 1-2    Saccades: hypometric/undershoots and slow more evident vertically than horizontally    Convergence/Divergence: reports diplopia at any distance     VESTIBULAR - OCULAR REFLEX:    Slow VOR: Comment: very slow and guarded horizontal; slightly  better vertical   VOR Cancellation: Normal; c/o slight increase in dizziness    Head-Impulse Test: HIT Right: positive HIT Left: covert positive        POSITIONAL TESTING:  Right Roll Test: negative Left Roll Test: c/o increased dizziness; Duration: 10 sec Right Sidelying: negative except for c/o woozing upon sitting up Left Sidelying: negative except for c/o woozing upon sitting up    VESTIBULAR TREATMENT:  PATIENT EDUCATION: Education details: prognosis, POC, HEP; educated on using RW rather than SPC d/t frequency of falls with hx of head trauma  Person educated: Patient Education method: Explanation, Demonstration, Tactile cues, Verbal cues, and Handouts Education comprehension: verbalized understanding and returned demonstration   GOALS: Goals reviewed with patient? Yes  SHORT TERM GOALS: Target date: 06/06/2022  Patient to be independent with initial HEP. Baseline: HEP initiated Goal status: INITIAL    LONG TERM GOALS: Target date: 07/11/2022  Patient to be independent with advanced HEP. Baseline: Not yet initiated  Goal status: INITIAL  Patient to report 0/10 dizziness with standing vertical and horizontal VOR for 30 seconds. Baseline: Unable Goal status: INITIAL  Patient will report 0/10 dizziness with bed  mobility.  Baseline: Symptomatic  Goal status: INITIAL  Patient to demonstrate mild-moderate sway with M-CTSIB condition with eyes closed/firm surface in order to improve safety in environments with dim lighting. Baseline: NT Goal status: INITIAL  Patient to score at least 43/56 on Berg in order to decrease risk of falls. Baseline: NT Goal status: INITIAL    ASSESSMENT:  CLINICAL IMPRESSION:   Patient is a 60 y/o M presenting to OPPT with c/o chronic dizziness since CVA in December 2022. Patient with recent hospitalization from 05/01/22-05/03/22 for R flank pain x  and hematuria. Hospital course was complicated by onset of Right arm weakness/numbness and bilateral facial numbness but imaging was negative. Acute PT exam findings were suggestive of B vestibular hypofunction. Dizziness is described as "off balance, spinning." Denies hearing loss, photo/phonophobia, migraines. Does endorse falls with head trauma, blurred vision, double vision and HA when he reads, tinnitus. Patient today presenting with rounded shoulders, forward head posture, decreased gait speed, gait deviations, increased time for transfers, and imbalance. Oculomotor exam revealed L orbit slightly elevated, saccades with superior smooth pursuits, undershooting with saccadic testing, convergence limited d/t baseline diplopia, slow and guarded VOR, dizziness with VOR cancellation, and positive R>L HIT. Positional testing was negative with exception of motion sensitivity. Patient was educated on gentle VOR and habituation HEP and reported understanding. Would benefit from skilled PT services 2x/week for 8 weeks to address aforementioned impairments in order to optimize level of function.    OBJECTIVE IMPAIRMENTS Abnormal gait, decreased balance, decreased endurance, decreased mobility, difficulty walking, dizziness, postural dysfunction, and pain.   ACTIVITY LIMITATIONS carrying, lifting, bending, sitting, standing, squatting,  sleeping, stairs, transfers, bed mobility, bathing, toileting, dressing, reach over head, hygiene/grooming, and locomotion level  PARTICIPATION LIMITATIONS: meal prep, cleaning, laundry, driving, shopping, community activity, and church  PERSONAL FACTORS Age, Fitness, Past/current experiences, Time since onset of injury/illness/exacerbation, and 3+ comorbidities: a-fib, HTN (hx HTN urgency), CVA, CAD, diapetic neuropathy, ESRD, HLD, kidney transplant 2018, macular degeneration, mild vascular neurocognitive disorder, SVT, DMII, AV fistula, CABG 5638, embolic CVA subcortical L frontal lobe with [name] residual weakness.  are also affecting patient's functional outcome.   REHAB POTENTIAL: Good  CLINICAL DECISION MAKING: Evolving/moderate complexity  EVALUATION COMPLEXITY: Moderate   PLAN: PT FREQUENCY: 2x/week  PT DURATION: 8 weeks  PLANNED INTERVENTIONS: Therapeutic exercises, Therapeutic activity, Neuromuscular  re-education, Balance training, Gait training, Patient/Family education, Self Care, Joint mobilization, Stair training, Vestibular training, Canalith repositioning, Dry Needling, Electrical stimulation, Cryotherapy, Moist heat, Manual therapy, and Re-evaluation  PLAN FOR NEXT SESSION: reassess HEP; assess Berg, MCTSIB, progress VOR and habituation    Janene Harvey, PT, DPT 05/16/22 1:49 PM  [facility] [address] [place], [place] [address] Phone # [phone] Fax # [phone]

## 2022-05-17 ENCOUNTER — Other Ambulatory Visit: Payer: Self-pay | Admitting: Family Medicine

## 2022-05-17 DIAGNOSIS — I671 Cerebral aneurysm, nonruptured: Secondary | ICD-10-CM

## 2022-05-21 NOTE — Progress Notes (Unsigned)
NEUROLOGY FOLLOW UP OFFICE NOTE  Robert [name] [id number]  Assessment/Plan:   Unclear etiology of subjective numbness and weakness - I do not think it was stroke or TIA Chronic daily headaches - medication overuse Bilateral vestibular hypofunction History of post-operative left greater than right bilateral embolic infarcts s/p CABG and AVR Lumbar radiculopathy, right-sided Hypertension Type 2 diabetes mellitus ESRD s/p renal transplant, immunocompromised    He is also on multiple medications that may be contributing to symptoms of polypharmacy.    I will have him start nortriptyline '10mg'$  at bedtime to address chronic headaches but also may help with neuropathic pain and sleep. Therefore I will have him discontinue duloxetine and trazodone.  He has Ambien to help with sleep if nortriptyline ineffective alone. I have advised him to discontinue tramadol.  May use acetaminophen for acute headache but instructed him to limit use to no more than 2 days out of the week. Once he finishes current physical therapy and dizziness has not resolved, he will start vestibular rehab Secondary stroke prevention as managed by cardiology and PCP: Eliquis Statin.  LDL goal less than 70 Normotensive blood pressure Hgb A1c goal less than 7 Follow up in 4 months.    Subjective:  Robert [name] is a 61 year old left-handed male with CAD, DM II, aortic valve stenosis, ESRD s/p renal transplant with CKD3b, hypothyroidism, HTN, sleep apnea and paroxysmal atrial tachycardia and PSVT who follows up for stroke.   UPDATE: Current medications:  Eliquis, rosuvastatin, duloxetine '30mg'$  daily, gabapentin '200mg'$  AM and '300mg'$  PM, amlodipine, carvedilol, doxazosin, clonidine, furosemide, HCTZ, hydralazine, duloxetine, prednisone, Cellcept, tramadol   Beginning in March, when he tried to return to work after discharge from hospital, he started experiencing chronic dizziness and headaches.  Headaches are severe  throbbing pain from left eye radiating to both temples.  Sometimes nausea but no photophobia, phonophobia or visual disturbance.  Occurs every other day.  Treats with tramadol and Tylenol.  Lasts 30 minutes with treatment, otherwise 1.5 hours.  Takes tramadol and Tylenol daily.  Dizziness is persistent but will have episodes of increased severity lasting 15 minutes every 2 days.  It is not persistent.    Admitted to hospital on 7/24 for UTI when he developed sudden onset of subjective bilateral facial numbness and right arm numbness and weakness.  Ongoing chronic dizziness and headache which is not new.  Other than known right leg weakness, no new objective focal deficits noted.  Stroke code initiated.  CT and subsequent MRI of head personally reviewed showed no acute stroke.  MRA head and neck personally reviewed revealed no LVO or hemodynamically significant stenosis.  Reported 8 mm left ICA terminus outpouching likely artifact due to motion.  2D echo showed EF 60-65%.  LDL 65.  Hgb A1c 7.4.  No change in secondary stroke prevention made in regards to antithrombotic therapy and statin- -advised to continue Eliquis and Crestor.    He underwent vestibular evaluation by physical therapy last week.  Oculomotor exam revealed saccades with superior smooth pursuits, undershooting with saccadic testing, slow and guarded VOR, positive HIT right greater than left, and negative positional testing.  Vestibular rehab recommended by his insurance will not allow him to start until he finishes current physical therapy for gait.       HISTORY: Following CABG x1 and aortic valve replacement  on 10/05/2021, he noted that he was unable to lift his right leg.  He has history of right lower extremity weakness and pain  following renal transplant in 2018 but his leg was now almost plegic.  CT head negative for acute abnormality.  MRI of brain on 10/11/2021 revealed scattered acute and subacute small infarcts in multiple vascular  territories including left cerebellar hemisphere, left MCA territory and bilateral PCA territory.  MRA of head and neck demonstrated chronic occluded V4 segment of the right vertebral artery as well as moderate stenosis of the right ACA A2.  The intra-operative TEE on 12/29 revealed no evidence of thrombus.  He did develop post-operative valvular a fib and was started on warfarin.  To evaluate for possible radiculopathy, he also had MRI of lumbar spine which revealed multilevel degenerative disc and endplate disease with mild spinal and left lateral recess stenosis at L1-2, moderate right neural foraminal stenosis at L3 and bilateral L4 nerve levels and moderate to severe neural foraminal stenosis at the bilateral L5 nerve levels.  He was discharged to SNF.     He was readmitted to the hospital on 10/21/2021 for hypertensive urgency presenting as dizziness and nausea.  Blood pressure was 204/119.  CT head again demonstrated known subacute embolic infarcts but no new acute findings.  He had to be hospitalized again on 11/08/2021 for   pyelonephritis and UTI.     Since the stroke, he has had episode of vertical double vision for several seconds.  He had worsening symptoms on 11/24/2021 while working with OT/PT.  Still has right leg weakness.  Arm is improved.  He has trouble ambulating.  He was seen in the ED where etiology was thought to be due to hypoglycemia.  CT head and follow up MRI of brain showed no acute findings.  Since the stroke, he has trouble remembering events that had occurred around that time.  Underwent neuropsychological evaluation on 02/08/2022 consistent with primarily mild neurocognitive disorder in conjunction with underlying anxiety, chronic pain (neuropathy) and sleep dysfunction.  Prior to the surgery, he had been on a daily baby ASA.    PAST MEDICAL HISTORY: Past Medical History:  Diagnosis Date   Acute cystitis without hematuria 11/09/2021   AKI (acute kidney injury) 08/08/2017    Elevated sCr to 2.5 (baseline ~1.7) noted at OSH - Subsequent transfer to [facility]  - Currently on IVMF in the setting of diarrhea and AKI - sCr at baseline at time of discharge   Anemia in chronic kidney disease 03/30/2015   Anticoagulated on Coumadin 11/09/2021   Aortic valve stenosis    Arthritis    Atrial fibrillation 11/29/2021   Benign essential hypertension 07/30/2014   prior to transplant pt took Metorpolol tartrate '50mg'$  qd on off HD days   Benign neoplasm of transverse colon    Cancer    skin ca right shoulder, plastic dsyplasia, pre-Ca polpys removed on Colonoscopy- 07/2014   Cerebrovascular accident (CVA) due to embolism of precerebral artery    Chronic renal failure    Colon polyps 07/23/2014   Tubular adenomas x 5   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   Diverticulosis of colon without hemorrhage    Eczema    ESRD (end stage renal disease)    hemodialysis 06/2014-02/2017, s/p living unrelated kidney transplant 02/26/17   Exertional dyspnea    Fatigue 01/08/2014   Fissure of skin 03/08/2020   GI  bleed 07/26/2017   Headache    Heart murmur    aortic stenosis (moderate-severe 04/2021)   History of COVID-19 2023   History of unilateral nephrectomy 09/06/2013   Hydrocele, acquired 04/07/2017   Hyperlipidemia 09/06/2013   Hypertensive urgency 10/21/2021   Hyponatremia 11/09/2021   Hypothyroidism    Immunosuppressed status 02/26/2017   Induction agent: Solumedrol - Envarsus '3mg'$  daily  - Home dose decreased from '5mg'$  to '3mg'$  in the setting of FK 15.7 while inpatient   - Previously on Prograf, though d/c'ed during previous admission; suspected this may be causing hallucinations  - Cellcept '1000mg'$  Q12H  - Prednisone '5mg'$  daily   Inguinal hernia without  obstruction or gangrene 06/30/2017   Right inguinal wall defect appreciated by CT imaging [place] and on exam Dr. Paulla Fore today Dr. Paulla Fore has discussed hernia repair with mesh at some point in near future  Last Assessment & Plan:  Formatting of this note might be different from the original. Right inguinal wall defect appreciated by CT imaging [place] and on exam Dr. Paulla Fore today Dr. Paulla Fore has discussed hern   Kidney transplanted 02/26/2017   Date of Transplant = 02/26/17  ABO (Donor/Recipient) = compatible Blood type: O+/A+ DonorType: Living donor, unrelated Allograft type:Left kidney Donor anatomy: 2 arteries, 1 veins and single ureter  Pants-to-skirt anastomosis of dual renal arteries on the backtable. Donor Kidney Bx: Not applicable Allograft injury/complications: None Ne   Lower extremity edema 09/06/2013   Macular degeneration    Mild vascular neurocognitive disorder 02/08/2022   OAB (overactive bladder) 12/28/2019   Obstructive sleep apnea 09/06/2013   On Bi-PAP  Last Assessment & Plan:  Formatting of this note might be different from the original. - History of OSA, continue CPAP at night   Peritoneal dialysis catheter in place 02/24/2015   RLQ abdominal pain 04/19/2020   S/P AVR (aortic valve replacement) 10/05/2021   SBO (small bowel obstruction) 05/2015   Scrotal edema 03/14/2017   Testicular ultrasound did show hydrocele  Will refer to urology regarding ongoign swelling, hydrocele.   Last Assessment & Plan:  Formatting of this note might be different from the original.   Testicular ultrasound did show hydrocele  Will refer to urology regarding ongoign swelling, hydrocele.   Shortness of breath    with exertion   Stage 3b chronic kidney disease (CKD) 03/22/2014   SVT (supraventricular tachycardia) 09/06/2013   Tremor of both hands 06/29/2017   Last Assessment & Plan:  Present since transplant, stable and unchanged Query from tacrolimus -Improved tremors noted and verbalized by pt -  Prograf doses titrated per FK levels  Last Assessment & Plan:  Formatting of this note might be different from the original. Present since transplant, stable and unchanged Query from tacrolimus -Improved tremors noted and verbalized by pt - Prograf doses titr   Type 2 diabetes mellitus with hyperglycemia, with long-term current use of insulin 45/40/9811   Umbilical hernia    s/p repair 04/28/15    MEDICATIONS: Current Outpatient Medications on File Prior to Visit  Medication Sig Dispense Refill   acetaminophen (TYLENOL) 325 MG tablet Take 2 tablets (650 mg total) by mouth every 4 (four) hours as needed for moderate pain.     albuterol (VENTOLIN HFA) 108 (90 Base) MCG/ACT inhaler Inhale 2 puffs into the lungs every 6 (six) hours as needed for wheezing or shortness of breath.     amLODipine (NORVASC) 5 MG tablet Take 1 tablet (5 mg total) by mouth daily. (Patient taking  differently: Take 2.5 mg by mouth daily.) 90 tablet 3   apixaban (ELIQUIS) 5 MG TABS tablet Take 1 tablet (5 mg total) by mouth 2 (two) times daily. Stop coumadin, monitor the PT/INR, and start Eliquis when the INR is <2. 180 tablet 3   belatacept (NULOJIX) 250 MG SOLR injection Inject 24.5 mLs into the vein every 28 (twenty-eight) days.      carvedilol (COREG) 25 MG tablet Take 1 tablet (25 mg total) by mouth 2 (two) times daily with a meal.     chlorthalidone (HYGROTON) 25 MG tablet Take 1 tablet by mouth daily.     Continuous Blood Gluc Sensor (FREESTYLE LIBRE 3 SENSOR) MISC SMARTSIG:1 Topical Every 2 Weeks     doxazosin (CARDURA) 2 MG tablet Take 0.5 tablets (1 mg total) by mouth daily. Take 0.5 Tablet Daily (Patient not taking: Reported on 04/20/2022) 45 tablet 3   DULoxetine HCl 30 MG CSDR Take 30 mg by mouth daily.     gabapentin (NEURONTIN) 100 MG capsule Take 200-300 mg by mouth See admin instructions. Taking 200 mg in the AM and 300 mg in the evening     hydrALAZINE (APRESOLINE) 100 MG tablet TAKE 1 TABLET BY MOUTH EVERY 8  HOURS (Patient taking differently: Take 100 mg by mouth 2 (two) times daily. May take a noon dose if Systolic is > 532) 992 tablet 1   insulin aspart (NOVOLOG) 100 UNIT/ML injection Inject 5-20 Units into the skin daily as needed for high blood sugar.     Insulin Pen Needle (B-D UF III MINI PEN NEEDLES) 31G X 5 MM MISC Use to inject insulin 4 times a day 300 each 2   insulin regular human CONCENTRATED (HUMULIN R) 500 UNIT/ML injection Inject 125 Units into the skin every 3 (three) days. Omnipod     levothyroxine (SYNTHROID) 112 MCG tablet Take 1 tablet (112 mcg total) by mouth daily. 90 tablet 3   mycophenolate (CELLCEPT) 250 MG capsule Take 1,000 mg by mouth every 12 (twelve) hours.      NEEDLE, DISP, 18 G (BD HYPODERMIC NEEDLE) 18G X 1" MISC Inject 2 each into the muscle every 7 (seven) days. Use 2 needles once weekly for Testosterone medication. Use 1 needle to draw up medication and 2'nd needle to inject into muscle. 50 each 1   NEEDLE, DISP, 23 G (BD DISP NEEDLE) 23G X 1" MISC USE TO INJECT TESTOSTERONE MEDICATION 50 each 1   nitroGLYCERIN (NITROSTAT) 0.4 MG SL tablet Place 0.4 mg under the tongue every 5 (five) minutes as needed for chest pain.     NON FORMULARY CPAP/BIPAP  at bedtime  BIPAP per pt     predniSONE (DELTASONE) 5 MG tablet Take 5 mg by mouth daily with breakfast. Continuously     rosuvastatin (CRESTOR) 20 MG tablet Take 1 tablet (20 mg total) by mouth daily. 90 tablet 3   senna-docusate (SENOKOT-S) 8.6-50 MG tablet Take 1 tablet by mouth at bedtime as needed for mild constipation. (Patient taking differently: Take 1 tablet by mouth every other day.)     Syringe, Disposable, 1 ML MISC Use to draw up 0.7m of testosterone medication 25 each 0   tamsulosin (FLOMAX) 0.4 MG CAPS capsule Take 0.8 mg by mouth daily after supper.     Testosterone Cypionate 200 MG/ML SOLN Inject 120 mg as directed once a week. Saturday     traMADol (ULTRAM) 50 MG tablet Take 50 mg by mouth every 4 (four)  hours as needed  for moderate pain.     traZODone (DESYREL) 50 MG tablet Take 50 mg by mouth at bedtime as needed for sleep.     zolpidem (AMBIEN) 10 MG tablet Take 10 mg by mouth at bedtime as needed for sleep.     No current facility-administered medications on file prior to visit.    ALLERGIES: No Known Allergies  FAMILY HISTORY: Family History  Problem Relation Age of Onset   Colon polyps Father    Stroke Father    Dementia Father        vascular   Colon cancer Neg Hx    Stomach cancer Neg Hx       Objective:  Blood pressure 136/60, pulse 77, height '5\' 11"'$  (1.803 m), weight 286 lb (129.7 kg), SpO2 95 %. General: No acute distress.  Patient appears well-groomed.   Head:  Normocephalic/atraumatic Eyes:  Fundi examined but not visualized Neck: supple, no paraspinal tenderness, full range of motion Heart:  Regular rate and rhythm Lungs:  Clear to auscultation bilaterally Back: No paraspinal tenderness Neurological Exam: alert and oriented to person, place, and time.  Speech fluent and not dysarthric, language intact.  CN II-XII intact. Nystagmus absent.  Bulk and tone normal, muscle strength 5/5 throughout.  Sensation to light touch intact.  Deep tendon reflexes 2+ throughout, toes downgoing.  Finger to nose testing intact.  Broad-based cautious gait.  Uses cane.  Romberg with sway.     Metta Clines, DO  CC: Domenick Gong, MD

## 2022-05-22 ENCOUNTER — Ambulatory Visit: Payer: 59 | Admitting: Neurology

## 2022-05-22 ENCOUNTER — Other Ambulatory Visit: Payer: Self-pay | Admitting: Endocrinology

## 2022-05-22 ENCOUNTER — Ambulatory Visit: Payer: 59 | Admitting: Physical Therapy

## 2022-05-22 VITALS — BP 136/60 | HR 77 | Ht 71.0 in | Wt 286.0 lb

## 2022-05-22 DIAGNOSIS — R519 Headache, unspecified: Secondary | ICD-10-CM | POA: Diagnosis not present

## 2022-05-22 DIAGNOSIS — H832X3 Labyrinthine dysfunction, bilateral: Secondary | ICD-10-CM

## 2022-05-22 DIAGNOSIS — G8929 Other chronic pain: Secondary | ICD-10-CM

## 2022-05-22 DIAGNOSIS — I639 Cerebral infarction, unspecified: Secondary | ICD-10-CM

## 2022-05-22 DIAGNOSIS — E1165 Type 2 diabetes mellitus with hyperglycemia: Secondary | ICD-10-CM

## 2022-05-22 DIAGNOSIS — E1142 Type 2 diabetes mellitus with diabetic polyneuropathy: Secondary | ICD-10-CM | POA: Diagnosis not present

## 2022-05-22 MED ORDER — NORTRIPTYLINE HCL 10 MG PO CAPS: 10.0000 mg | ORAL_CAPSULE | Freq: Every day | ORAL | 5 refills | Status: DC

## 2022-05-22 NOTE — Patient Instructions (Signed)
START NORTRIPTYLINE '10MG'$  AT BEDTIME (HELPS WITH HEADACHE, NEUROPATHIC PAIN AND SLEEP).  STOP TRAZODONE, DULOXETINE STOP TRAMADOL. MAY CONTINUE TYLENOL BUT LIMIT TO NO MORE THAN 2 DAYS OUT OF WEEK TO PREVENT REBOUND HEADACHE IF DIZZINESS NOT IMPROVED BY END OF CURRENT PHYSICAL THERAPY, START THE VESTIBULAR NEURO REHAB FOLLOW UP 4 MONTHS.

## 2022-05-23 ENCOUNTER — Encounter: Payer: Self-pay | Admitting: Neurology

## 2022-05-23 ENCOUNTER — Ambulatory Visit: Payer: 59 | Admitting: Physician Assistant

## 2022-05-23 ENCOUNTER — Telehealth: Payer: Self-pay | Admitting: Physician Assistant

## 2022-05-23 NOTE — Telephone Encounter (Signed)
Good Morning Robert [name],  Patient called stating that he over slept for his appointment at 9:00 and needed to reschedule his appointment.  Patient was rescheduled for 9/14 at 211:00 with Amy [place].

## 2022-05-24 ENCOUNTER — Encounter: Payer: 59 | Admitting: Physical Therapy

## 2022-05-24 NOTE — Progress Notes (Signed)
Cardiology Clinic Note   Patient Name: Robert [name] Date of Encounter: 05/25/2022  Primary Care Provider:  Haywood Pao, MD Primary Cardiologist:  Shelva Majestic, MD  Patient Profile    Robert [name] presents to the clinic for posthospital follow-up.  He was admitted on 05/01/2022 and discharged on 05/03/2022.  Past Medical History    Past Medical History:  Diagnosis Date   Acute cystitis without hematuria 11/09/2021   AKI (acute kidney injury) 08/08/2017   Elevated sCr to 2.5 (baseline ~1.7) noted at OSH - Subsequent transfer to [facility]  - Currently on IVMF in the setting of diarrhea and AKI - sCr at baseline at time of discharge   Anemia in chronic kidney disease 03/30/2015   Anticoagulated on Coumadin 11/09/2021   Aortic valve stenosis    Arthritis    Atrial fibrillation 11/29/2021   Benign essential hypertension 07/30/2014   prior to transplant pt took Metorpolol tartrate '50mg'$  qd on off HD days   Benign neoplasm of transverse colon    Cancer    skin ca right shoulder, plastic dsyplasia, pre-Ca polpys removed on Colonoscopy- 07/2014   Cerebrovascular accident (CVA) due to embolism of precerebral artery    Chronic renal failure    Colon polyps 07/23/2014   Tubular adenomas x 5   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   Diverticulosis of colon without hemorrhage    Eczema    ESRD (end stage renal disease)    hemodialysis 06/2014-02/2017, s/p living unrelated kidney transplant 02/26/17   Exertional dyspnea    Fatigue 01/08/2014   Fissure of skin 03/08/2020   GI bleed 07/26/2017   Headache    Heart murmur    aortic stenosis (moderate-severe 04/2021)   History of COVID-19 2023   History of unilateral nephrectomy 09/06/2013    Hydrocele, acquired 04/07/2017   Hyperlipidemia 09/06/2013   Hypertensive urgency 10/21/2021   Hyponatremia 11/09/2021   Hypothyroidism    Immunosuppressed status 02/26/2017   Induction agent: Solumedrol - Envarsus '3mg'$  daily  - Home dose decreased from '5mg'$  to '3mg'$  in the setting of FK 15.7 while inpatient   - Previously on Prograf, though d/c'ed during previous admission; suspected this may be causing hallucinations  - Cellcept '1000mg'$  Q12H  - Prednisone '5mg'$  daily   Inguinal hernia without obstruction or gangrene 06/30/2017   Right inguinal wall defect appreciated by CT imaging [place] and on exam Dr. Paulla Fore today Dr. Paulla Fore has discussed hernia repair with mesh at some point in near future  Last Assessment & Plan:  Formatting of this note might be different from the original. Right inguinal wall defect appreciated by CT imaging [place] and on exam Dr. Paulla Fore today Dr. Paulla Fore has discussed hern   Kidney transplanted 02/26/2017   Date of Transplant = 02/26/17  ABO (Donor/Recipient) = compatible Blood type: O+/A+ DonorType: Living donor, unrelated Allograft type:Left kidney Donor anatomy: 2 arteries, 1 veins and single ureter  Pants-to-skirt anastomosis of dual renal arteries on the backtable. Donor Kidney Bx: Not applicable Allograft injury/complications: None Ne   Lower extremity edema 09/06/2013   Macular degeneration    Mild vascular neurocognitive disorder 02/08/2022   OAB (overactive bladder) 12/28/2019   Obstructive sleep apnea 09/06/2013   On Bi-PAP  Last  Assessment & Plan:  Formatting of this note might be different from the original. - History of OSA, continue CPAP at night   Peritoneal dialysis catheter in place 02/24/2015   RLQ abdominal pain 04/19/2020   S/P AVR (aortic valve replacement) 10/05/2021   SBO (small bowel obstruction) 05/2015   Scrotal edema 03/14/2017   Testicular ultrasound did show hydrocele  Will refer to urology regarding ongoign swelling, hydrocele.   Last Assessment &  Plan:  Formatting of this note might be different from the original.   Testicular ultrasound did show hydrocele  Will refer to urology regarding ongoign swelling, hydrocele.   Shortness of breath    with exertion   Stage 3b chronic kidney disease (CKD) 03/22/2014   SVT (supraventricular tachycardia) 09/06/2013   Tremor of both hands 06/29/2017   Last Assessment & Plan:  Present since transplant, stable and unchanged Query from tacrolimus -Improved tremors noted and verbalized by pt - Prograf doses titrated per FK levels  Last Assessment & Plan:  Formatting of this note might be different from the original. Present since transplant, stable and unchanged Query from tacrolimus -Improved tremors noted and verbalized by pt - Prograf doses titr   Type 2 diabetes mellitus with hyperglycemia, with long-term current use of insulin 65/78/4696   Umbilical hernia    s/p repair 04/28/15   Past Surgical History:  Procedure Laterality Date   AORTIC VALVE REPLACEMENT N/A 10/05/2021   Procedure: AORTIC VALVE REPLACEMENT (AVR) WITH INSPIRIS RESILIA AORTIC VALVE SIZE 27MM;  Surgeon: [name], MD;  Location: [place];  Service: Open Heart Surgery;  Laterality: N/A;   AV FISTULA PLACEMENT Right 02/25/2015   Procedure: RIGHT ARTERIOVENOUS (AV) FISTULA CREATION;  Surgeon: Serafina Mitchell, MD;  Location: [place];  Service: Vascular;  Laterality: Right;   AV FISTULA PLACEMENT Right 09/15/2015   Procedure: ARTERIOVENOUS (AV) FISTULA CREATION- RIGHT ARM;  Surgeon: Serafina Mitchell, MD;  Location: [place];  Service: Vascular;  Laterality: Right;   CAPD INSERTION N/A 05/18/2014   Procedure: LAPAROSCOPIC INSERTION CONTINUOUS AMBULATORY PERITONEAL DIALYSIS  (CAPD) CATHETER;  Surgeon: Ralene Ok, MD;  Location: [place];  Service: General;  Laterality: N/A;   COLONOSCOPY     COLONOSCOPY WITH PROPOFOL N/A 07/12/2016   Procedure: COLONOSCOPY WITH PROPOFOL;  Surgeon: [name], MD;  Location: [facility];  Service:  Endoscopy;  Laterality: N/A;   CORONARY ARTERY BYPASS GRAFT N/A 10/05/2021   Procedure: CORONARY ARTERY BYPASS GRAFTING (CABG) X 1, ON PUMP, USING LEFT INTERNAL MAMMARY ARTERY CONDUIT;  Surgeon: [name], MD;  Location: [place];  Service: Open Heart Surgery;  Laterality: N/A;   declotting of fistula  08/2015   ESOPHAGOGASTRODUODENOSCOPY (EGD) WITH PROPOFOL N/A 07/12/2016   Procedure: ESOPHAGOGASTRODUODENOSCOPY (EGD) WITH PROPOFOL;  Surgeon: [name], MD;  Location: [facility];  Service: Endoscopy;  Laterality: N/A;   FISTULA SUPERFICIALIZATION Right 02/07/2016   Procedure: RIIGHT UPPER ARM FISTULA SUPERFICIALIZATION;  Surgeon: Elam Dutch, MD;  Location: [place];  Service: Vascular;  Laterality: Right;   IJ catheter insertion     INSERTION OF DIALYSIS CATHETER Right 02/25/2015   Procedure: INSERTION OF RIGHT INTERNAL JUGULAR DIALYSIS CATHETER;  Surgeon: Serafina Mitchell, MD;  Location: [place];  Service: Vascular;  Laterality: Right;   KIDNEY TRANSPLANT     LAPAROSCOPIC REPOSITIONING CAPD CATHETER N/A 06/16/2014   Procedure: LAPAROSCOPIC REPOSITIONING CAPD CATHETER;  Surgeon: Ralene Ok, MD;  Location: [place];  Service: General;  Laterality: N/A;   LAPAROSCOPY  N/A 05/04/2015   Procedure: LAPAROSCOPY DIAGNOSTIC LYSIS OF ADHESIONS;  Surgeon: Coralie Keens, MD;  Location: [place];  Service: General;  Laterality: N/A;   MINOR REMOVAL OF PERITONEAL DIALYSIS CATHETER N/A 04/28/2015   Procedure:  REMOVAL OF PERITONEAL DIALYSIS CATHETER;  Surgeon: Coralie Keens, MD;  Location: [place];  Service: General;  Laterality: N/A;   NEPHRECTOMY Left 1974   RENAL BIOPSY Right 2012   REVISON OF ARTERIOVENOUS FISTULA Right 05/26/2015   Procedure: SUPERFICIALIZATION OF ARTERIOVENOUS FISTULA WITH SIDE BRANCH LIGATIONS;  Surgeon: Serafina Mitchell, MD;  Location: [place];  Service: Vascular;  Laterality: Right;   RIGHT/LEFT HEART CATH AND CORONARY ANGIOGRAPHY N/A 08/22/2021   Procedure: RIGHT/LEFT HEART CATH  AND CORONARY ANGIOGRAPHY;  Surgeon: Troy Sine, MD;  Location: [place] [facility];  Service: Cardiovascular;  Laterality: N/A;   SKIN CANCER EXCISION     right shoulder   SVT ABLATION N/A 11/23/2016   Procedure: SVT Ablation;  Surgeon: Will Meredith Leeds, MD;  Location: [place] [facility];  Service: Cardiovascular;  Laterality: N/A;   TEE WITHOUT CARDIOVERSION N/A 10/05/2021   Procedure: TRANSESOPHAGEAL ECHOCARDIOGRAM (TEE);  Surgeon: [name], MD;  Location: [place];  Service: Open Heart Surgery;  Laterality: N/A;   UMBILICAL HERNIA REPAIR N/A 05/18/2014   Procedure: HERNIA REPAIR UMBILICAL ADULT;  Surgeon: Ralene Ok, MD;  Location: [place];  Service: General;  Laterality: N/A;   UMBILICAL HERNIA REPAIR N/A 04/28/2015   Procedure: UMBILICAL HERNIA REPAIR WITH MESH;  Surgeon: Coralie Keens, MD;  Location: [place];  Service: General;  Laterality: N/A;    Allergies  No Known Allergies  History of Present Illness    MAKARI PORTMAN has a PMH of renal transplant, diabetes mellitus type 2, atrial fibrillation, TIA, morbid obesity, CKD stage III, OSA, and hypertension.  He is status post bypass surgery and AVR with biological prosthesis 10/05/2021 by Dr. Cyndia Bent.  He remains on anticoagulation for paroxysmal atrial fibrillation and history of embolic stroke.  He was on hemodialysis for short period of time and then received a kidney transplant in 2018.  He remains on immunosuppressive therapy.  His PMH also includes SVT which has responded to vagal maneuvers.  He was seen by EP who evaluated by felt an ablation could not be performed.  He was seen by Dr. Sallyanne Kuster on 03/15/2022.  He presented with chest discomfort that occurred during an intense phone conversation/argument.  He was discussing with his immediate supervisor and human resources regarding continuing to work from home.  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  He reported that he became  emotional during conversation and began to notice chest discomfort that radiated to his right arm.  He noted that his blood pressure was 180/90's (10-15 minutes after conversation) and after finishing the conversation his symptoms slowly began to improve.  He contacted the cardiology office and schedule an appointment.  He was seen approximately 4 hours later and he was feeling better.  His angina had resolved.  He did note some tingling in his right hand and a mild headache.  He was admitted to the hospital on 04/30/2022 and discharged on 05/03/2022.  He was diagnosed with acute pyelonephritis.  On 05/01/2022 he developed bilateral facial numbness and right arm numbness with weakness.  A code stroke was called.  His head CT scan showed old infarct with no evidence of acute CVA.  MRI of his brain showed no acute  intracranial abnormalities and was grossly negative.  Neurology was consulted and agreed with stroke work-up.  They recommended a hold of his anticoagulation during hospitalization.  He was started on high-dose statin and his aspirin was continued as outpatient.  Physical therapy evaluated and recommended home PT.  He followed up with Dr. Tomi Likens neurology on 05/22/2022.  It was not felt that he had a stroke or TIA.  He complained of chronic headaches and dizziness.  He was started on nortriptyline 10 mg at bedtime.  His duloxetine and trazodone were discontinued.  He was instructed to use acetaminophen for his headaches.  Once he finishes physical therapy if his dizziness has not resolved it was planned that he will start vestibular rehab.  Follow-up was planned for 4 months.  He presents to the clinic today for follow-up evaluation states he continues to feel dizzy.  We reviewed his recent visit to the hospital and his follow-up appointment with neurology.  We reviewed dizziness as it relates to the vestibular system.  He expressed understanding.  We reviewed his lab work from the hospital.  He reports  that his blood pressure at home has been somewhat labile.  Initially in clinic today his blood pressure is 166/86 and on recheck it is 134/82.  I recommended that he continue his physical therapy and proceed to vestibular therapy.  I will increase his amlodipine to 7.5 mg daily, given salty 6 diet sheet, and plan follow-up in 3 to 4 months.   Today he denies chest pain, shortness of breath, lower extremity edema, fatigue, palpitations, melena, hematuria, hemoptysis, diaphoresis, weakness, syncope, orthopnea, and PND.   Home Medications    Prior to Admission medications   Medication Sig Start Date End Date Taking? Authorizing Provider  acetaminophen (TYLENOL) 325 MG tablet Take 2 tablets (650 mg total) by mouth every 4 (four) hours as needed for moderate pain. 10/15/21   Nani Skillern, PA-C  amLODipine (NORVASC) 5 MG tablet Take 1 tablet (5 mg total) by mouth daily. Patient taking differently: Take 2.5 mg by mouth daily. 03/12/22 03/07/23  Troy Sine, MD  apixaban (ELIQUIS) 5 MG TABS tablet Take 1 tablet (5 mg total) by mouth 2 (two) times daily. Stop coumadin, monitor the PT/INR, and start Eliquis when the INR is <2. 01/31/22   Warren Lacy, PA-C  B-D UF III MINI PEN NEEDLES 31G X 5 MM MISC USE TO INJECT INSULIN 4 TIMES A DAY 05/23/22   Elayne Snare, MD  belatacept (NULOJIX) 250 MG SOLR injection Inject 24.5 mLs into the vein every 28 (twenty-eight) days.  08/30/17   [provider]  carvedilol (COREG) 25 MG tablet Take 1 tablet (25 mg total) by mouth 2 (two) times daily with a meal. 10/26/21   Dahal, Marlowe Aschoff, MD  chlorthalidone (HYGROTON) 25 MG tablet Take 1 tablet by mouth daily. 04/06/22 04/06/23  [provider]  Continuous Blood Gluc Sensor (FREESTYLE LIBRE 3 SENSOR) MISC SMARTSIG:1 Topical Every 2 Weeks 11/21/21   [provider]  doxazosin (CARDURA) 2 MG tablet Take 0.5 tablets (1 mg total) by mouth daily. Take 0.5 Tablet Daily Patient not taking: Reported  on 05/22/2022 02/07/22   Warren Lacy, PA-C  gabapentin (NEURONTIN) 100 MG capsule Take 200-300 mg by mouth See admin instructions. Taking 200 mg in the AM and 300 mg in the evening    [provider]  hydrALAZINE (APRESOLINE) 100 MG tablet TAKE 1 TABLET BY MOUTH EVERY 8 HOURS Patient taking differently: Take 100  mg by mouth 2 (two) times daily. May take a noon dose if Systolic is > 462 04/09/49   Croitoru, Mihai, MD  insulin aspart (NOVOLOG) 100 UNIT/ML injection Inject 5-20 Units into the skin daily as needed for high blood sugar.    [provider]  insulin regular human CONCENTRATED (HUMULIN R) 500 UNIT/ML injection Inject 125 Units into the skin every 3 (three) days. Omnipod    [provider]  levothyroxine (SYNTHROID) 112 MCG tablet Take 1 tablet (112 mcg total) by mouth daily. 01/09/22   Elayne Snare, MD  mycophenolate (CELLCEPT) 250 MG capsule Take 1,000 mg by mouth every 12 (twelve) hours.     [provider]  NEEDLE, DISP, 18 G (BD HYPODERMIC NEEDLE) 18G X 1" MISC Inject 2 each into the muscle every 7 (seven) days. Use 2 needles once weekly for Testosterone medication. Use 1 needle to draw up medication and 2'nd needle to inject into muscle. 04/20/22   Elayne Snare, MD  NEEDLE, DISP, 23 G (BD DISP NEEDLE) 23G X 1" MISC USE TO INJECT TESTOSTERONE MEDICATION 04/20/22   Elayne Snare, MD  nitroGLYCERIN (NITROSTAT) 0.4 MG SL tablet Place 0.4 mg under the tongue every 5 (five) minutes as needed for chest pain.    [provider]  NON FORMULARY CPAP/BIPAP  at bedtime  BIPAP per pt    [provider]  nortriptyline (PAMELOR) 10 MG capsule Take 1 capsule (10 mg total) by mouth at bedtime. 05/22/22   Pieter Partridge, DO  predniSONE (DELTASONE) 5 MG tablet Take 5 mg by mouth daily with breakfast. Continuously    [provider]  rosuvastatin (CRESTOR) 20 MG tablet Take 1 tablet (20 mg total) by mouth daily. 02/09/22   Elayne Snare, MD   senna-docusate (SENOKOT-S) 8.6-50 MG tablet Take 1 tablet by mouth at bedtime as needed for mild constipation. Patient taking differently: Take 1 tablet by mouth every other day. 10/26/21   Terrilee Croak, MD  Syringe, Disposable, 1 ML MISC Use to draw up 0.41m of testosterone medication 04/20/22   KElayne Snare MD  tamsulosin (FLOMAX) 0.4 MG CAPS capsule Take 0.8 mg by mouth daily after supper.    [provider]  Testosterone Cypionate 200 MG/ML SOLN Inject 120 mg as directed once a week. Saturday    [provider]  zolpidem (AMBIEN) 10 MG tablet Take 10 mg by mouth at bedtime as needed for sleep.    [provider]    Family History    Family History  Problem Relation Age of Onset   Colon polyps Father    Stroke Father    Dementia Father        vascular   Colon cancer Neg Hx    Stomach cancer Neg Hx    He indicated that his mother is deceased. He indicated that his father is deceased. He indicated that all of his three sisters are alive. He indicated that both of his brothers are alive. He indicated that his maternal grandmother is deceased. He indicated that his maternal grandfather is deceased. He indicated that his paternal grandmother is deceased. He indicated that his paternal grandfather is deceased. He indicated that the status of his neg hx is unknown.  Social History    Social History   Socioeconomic History   Marital status: Married    Spouse name: Not on file   Number of children: 3   Years of education: 16   Highest education level: Bachelor's degree (e.g., BA,  AB, BS)  Occupational History   Occupation: Training and development officer: [employer]  Tobacco Use   Smoking status: Former    Types: Cigars    Quit date: 12/06/2012    Years since quitting: 9.4   Smokeless tobacco: Never  Vaping Use   Vaping Use: Never used  Substance and Sexual Activity   Alcohol use: Not Currently   Drug use: No   Sexual activity: Not on file  Other Topics Concern    Not on file  Social History Narrative   Not on file   Social Determinants of Health   Financial Resource Strain: Low Risk  (03/21/2022)   Overall Financial Resource Strain (CARDIA)    Difficulty of Paying Living Expenses: Not very hard  Food Insecurity: No Food Insecurity (03/21/2022)   Hunger Vital Sign    Worried About Running Out of Food in the Last Year: Never true    Ran Out of Food in the Last Year: Never true  Transportation Needs: No Transportation Needs (03/21/2022)   PRAPARE - Hydrologist (Medical): No    Lack of Transportation (Non-Medical): No  Physical Activity: Not on file  Stress: Not on file  Social Connections: Not on file  Intimate Partner Violence: Not on file     Review of Systems    General:  No chills, fever, night sweats or weight changes.  Cardiovascular:  No chest pain, dyspnea on exertion, edema, orthopnea, palpitations, paroxysmal nocturnal dyspnea. Dermatological: No rash, lesions/masses Respiratory: No cough, dyspnea Urologic: No hematuria, dysuria Abdominal:   No nausea, vomiting, diarrhea, bright red blood per rectum, melena, or hematemesis Neurologic:  No visual changes, wkns, changes in mental status. All other systems reviewed and are otherwise negative except as noted above.  Physical Exam    VS:  BP 134/82   Ht '5\' 11"'$  (1.803 m)   Wt 277 lb (125.6 kg)   SpO2 96%   BMI 38.63 kg/m  , BMI Body mass index is 38.63 kg/m. GEN: Well nourished, well developed, in no acute distress. HEENT: normal. Neck: Supple, no JVD, carotid bruits, or masses. Cardiac: RRR, no murmurs, rubs, or gallops. No clubbing, cyanosis, edema.  Radials/DP/PT 2+ and equal bilaterally.  Respiratory:  Respirations regular and unlabored, clear to auscultation bilaterally. GI: Soft, nontender, nondistended, BS + x 4. MS: no deformity or atrophy. Skin: warm and dry, no rash. Neuro:  Strength and sensation are intact. Psych: Normal  affect.  Accessory Clinical Findings    Recent Labs: 11/13/2021: B Natriuretic Peptide 571.0 01/04/2022: TSH 5.23 05/01/2022: ALT 13; BUN 41; Creatinine, Ser 2.16; Magnesium 2.3; Potassium 3.4; Sodium 137 05/02/2022: Hemoglobin 11.8; Platelets 213   Recent Lipid Panel    Component Value Date/Time   CHOL 116 05/02/2022 0334   TRIG 45 05/02/2022 0334   HDL 52 05/02/2022 0334   CHOLHDL 2.2 05/02/2022 0334   VLDL 9 05/02/2022 0334   LDLCALC 55 05/02/2022 0334    ECG personally reviewed by me today-none today.    Echocardiogram 05/01/2022  IMPRESSIONS     1. Left ventricular ejection fraction, by estimation, is 60 to 65%. The  left ventricle has normal function. The left ventricle has no regional  wall motion abnormalities. There is moderate left ventricular hypertrophy.  Left ventricular diastolic  parameters are consistent with Grade II diastolic dysfunction  (pseudonormalization).   2. Right ventricular systolic function is normal. The right ventricular  size is normal. There is normal pulmonary artery systolic pressure.  3. Left atrial size was moderately dilated.   4. Right atrial size was mild to moderately dilated.   5. No evidence of mitral valve regurgitation. Moderate mitral annular  calcification.   6. S/p AVR 27 mm Edwards Inspiris Resilia Prosthesis. No significant PVL.  Well seated. AV max 2.3 m/s. Mean gradient 11 mmHg. Normal prosthesis.  Aortic valve regurgitation is not visualized.   7. There is mild dilatation of the ascending aorta, measuring 39 mm.   8. The inferior vena cava is normal in size with greater than 50%  respiratory variability, suggesting right atrial pressure of 3 mmHg.   Comparison(s): No significant change from prior study. Assessment & Plan   1.  Dizziness-recently admitted to the hospital with acute pyelonephritis.  He developed stroke symptoms the next day.  His imaging was negative for acute abnormalities.  He followed up with  neurology who felt that his dizziness is related to vestibular hypofunction. Continues physical therapy , proceed to vestibular rehab Maintain p.o. hydration Change positions slowly  Essential hypertension-BP today 134/82.  Somewhat labile at home. Continue carvedilol, hydralazine Amlodipine increase to 7.5 Heart healthy low-sodium diet-salty 6 given Increase physical activity as tolerated  Coronary artery disease-status post CABG 10/05/2021 with AVR Continue amlodipine, carvedilol, doxazosin, rosuvastatin Heart healthy low-sodium high-fiber diet Continue physical therapy  Paroxysmal atrial fibrillation-heart rate today 88.  Reports compliance with apixaban and denies bleeding issues. Continue apixaban, carvedilol Heart healthy low-sodium diet Increase physical activity as tolerated  Angina pectoris-no further episodes of chest discomfort .  It was felt that his discomfort was related to elevated blood pressure, elevated heart rate, and emotional distress.  His EKG at that time showed no changes. Mindfulness stress reduction Sublingual nitroglycerin  Disposition: Follow-up with Dr. Claiborne Billings in 3-4 months.   Jossie Ng. Scottlyn Mchaney NP-C     05/25/2022, [address] [place] [address] Office [phone] Fax [phone]  Notice: This dictation was prepared with Dragon dictation along with smaller phrase technology. Any transcriptional errors that result from this process are unintentional and may not be corrected upon review.  I spent 14 minutes examining this patient, reviewing medications, and using patient centered shared decision making involving her cardiac care.  Prior to her visit I spent greater than 20 minutes reviewing her past medical history,  medications, and prior cardiac tests.

## 2022-05-25 ENCOUNTER — Ambulatory Visit (INDEPENDENT_AMBULATORY_CARE_PROVIDER_SITE_OTHER): Payer: 59 | Admitting: General Practice

## 2022-05-25 ENCOUNTER — Encounter: Payer: Self-pay | Admitting: General Practice

## 2022-05-25 VITALS — BP 134/82 | Ht 71.0 in | Wt 277.0 lb

## 2022-05-25 DIAGNOSIS — R42 Dizziness and giddiness: Secondary | ICD-10-CM

## 2022-05-25 DIAGNOSIS — I209 Angina pectoris, unspecified: Secondary | ICD-10-CM

## 2022-05-25 DIAGNOSIS — I25119 Atherosclerotic heart disease of native coronary artery with unspecified angina pectoris: Secondary | ICD-10-CM

## 2022-05-25 DIAGNOSIS — I10 Essential (primary) hypertension: Secondary | ICD-10-CM | POA: Diagnosis not present

## 2022-05-25 DIAGNOSIS — Z951 Presence of aortocoronary bypass graft: Secondary | ICD-10-CM

## 2022-05-25 DIAGNOSIS — I4891 Unspecified atrial fibrillation: Secondary | ICD-10-CM

## 2022-05-25 MED ORDER — AMLODIPINE BESYLATE 5 MG PO TABS: 7.5000 mg | ORAL_TABLET | Freq: Every day | ORAL | 6 refills | Status: DC

## 2022-05-25 NOTE — Patient Instructions (Signed)
Medication Instructions:  INCREASE AMLODIPINE 7.'5MG'$  DAILY (1-1/2 TAB)  *If you need a refill on your cardiac medications before your next appointment, please call your pharmacy*  Lab Work:   Testing/Procedures:  NONE    NONE  If you have labs (blood work) drawn today and your tests are completely normal, you will receive your results only by:  1-MyChart Message (if you have MyChart) OR 2- A paper copy in the mail.  If you have any lab test that is abnormal or we need to change your treatment, we will call you to review the results.  Special Instructions PLEASE READ AND FOLLOW SALTY 6-ATTACHED-1,'800mg'$  daily  TAKE AND LOG YOUR BLOOD PRESSURE  FINISH PHYSICAL THERAPY THEN PROCEED TO VESTIBULAR THERAPY   Follow-Up: Your next appointment:  3-4 month(s) In Person with Shelva Majestic, MD    At [facility], you and your health needs are our priority.  As part of our continuing mission to provide you with exceptional heart care, we have created designated Provider Care Teams.  These Care Teams include your primary Cardiologist (physician) and Advanced Practice Providers (APPs -  Physician Assistants and Nurse Practitioners) who all work together to provide you with the care you need, when you need it.  Important Information About Sugar             6 SALTY THINGS TO AVOID     1,'800MG'$  DAILY

## 2022-05-27 ENCOUNTER — Encounter: Payer: Self-pay | Admitting: Neurology

## 2022-05-31 ENCOUNTER — Encounter: Payer: 59 | Admitting: Physical Therapy

## 2022-05-31 ENCOUNTER — Ambulatory Visit
Admission: RE | Admit: 2022-05-31 | Discharge: 2022-05-31 | Disposition: A | Discharge status: Home / Self Care | Payer: 59 | Source: Ambulatory Visit | Attending: Family Medicine | Admitting: Family Medicine

## 2022-05-31 DIAGNOSIS — I671 Cerebral aneurysm, nonruptured: Secondary | ICD-10-CM

## 2022-06-07 ENCOUNTER — Encounter: Payer: 59 | Admitting: Physical Therapy

## 2022-06-08 ENCOUNTER — Encounter: Payer: Self-pay | Admitting: Cardiovascular Disease

## 2022-06-11 NOTE — Telephone Encounter (Signed)
Sounds like he needs to be seen in the office and have his diuretics adjusted

## 2022-06-12 ENCOUNTER — Encounter: Payer: Self-pay | Admitting: Physical Therapy

## 2022-06-12 ENCOUNTER — Encounter: Payer: 59 | Admitting: Physical Therapy

## 2022-06-12 ENCOUNTER — Ambulatory Visit: Payer: 59 | Attending: Family Medicine | Admitting: Physical Therapy

## 2022-06-12 DIAGNOSIS — R42 Dizziness and giddiness: Secondary | ICD-10-CM | POA: Diagnosis present

## 2022-06-12 DIAGNOSIS — R2689 Other abnormalities of gait and mobility: Secondary | ICD-10-CM

## 2022-06-12 DIAGNOSIS — R2681 Unsteadiness on feet: Secondary | ICD-10-CM

## 2022-06-12 NOTE — Therapy (Signed)
OUTPATIENT PHYSICAL THERAPY VESTIBULAR TREATMENT     Patient Name: Robert [name] MRN: [id number] DOB:19-Sep-1961, 61 y.o., male Today's Date: 06/12/2022  PCP: Haywood Pao, MD REFERRING PROVIDER: Geoffery Lyons, NP   PT End of Session - 06/12/22 1528     Visit Number 2    Number of Visits 17    Date for PT Re-Evaluation 07/11/22    Authorization Type UHC    PT Start Time 4098    PT Stop Time 1526    PT Time Calculation (min) 39 min    Activity Tolerance Patient tolerated treatment well    Behavior During Therapy [facility] for tasks assessed/performed              Past Medical History:  Diagnosis Date   Acute cystitis without hematuria 11/09/2021   AKI (acute kidney injury) 08/08/2017   Elevated sCr to 2.5 (baseline ~1.7) noted at OSH - Subsequent transfer to [facility]  - Currently on IVMF in the setting of diarrhea and AKI - sCr at baseline at time of discharge   Anemia in chronic kidney disease 03/30/2015   Anticoagulated on Coumadin 11/09/2021   Aortic valve stenosis    Arthritis    Atrial fibrillation 11/29/2021   Benign essential hypertension 07/30/2014   prior to transplant pt took Metorpolol tartrate '50mg'$  qd on off HD days   Benign neoplasm of transverse colon    Cancer    skin ca right shoulder, plastic dsyplasia, pre-Ca polpys removed on Colonoscopy- 07/2014   Cerebrovascular accident (CVA) due to embolism of precerebral artery    Chronic renal failure    Colon polyps 07/23/2014   Tubular adenomas x 5   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   Diverticulosis of colon without hemorrhage    Eczema    ESRD (end stage renal disease)    hemodialysis 06/2014-02/2017, s/p living unrelated kidney transplant 02/26/17   Exertional dyspnea     Fatigue 01/08/2014   Fissure of skin 03/08/2020   GI bleed 07/26/2017   Headache    Heart murmur    aortic stenosis (moderate-severe 04/2021)   History of COVID-19 2023   History of unilateral nephrectomy 09/06/2013   Hydrocele, acquired 04/07/2017   Hyperlipidemia 09/06/2013   Hypertensive urgency 10/21/2021   Hyponatremia 11/09/2021   Hypothyroidism    Immunosuppressed status 02/26/2017   Induction agent: Solumedrol - Envarsus '3mg'$  daily  - Home dose decreased from '5mg'$  to '3mg'$  in the setting of FK 15.7 while inpatient   - Previously on Prograf, though d/c'ed during previous admission; suspected this may be causing hallucinations  - Cellcept '1000mg'$  Q12H  - Prednisone '5mg'$  daily   Inguinal hernia without obstruction or gangrene 06/30/2017   Right inguinal wall defect appreciated by CT imaging [place] and on exam Dr. Paulla Fore today Dr. Paulla Fore has discussed hernia repair with mesh at some point in near future  Last Assessment & Plan:  Formatting of this note might be different from the original. Right inguinal wall defect appreciated by CT imaging [place] and on exam Dr. Paulla Fore today Dr. Paulla Fore has discussed hern   Kidney transplanted 02/26/2017   Date of Transplant = 02/26/17  ABO (Donor/Recipient) = compatible Blood type: O+/A+ DonorType: Living donor, unrelated Allograft type:Left kidney Donor anatomy:  2 arteries, 1 veins and single ureter  Pants-to-skirt anastomosis of dual renal arteries on the backtable. Donor Kidney Bx: Not applicable Allograft injury/complications: None Ne   Lower extremity edema 09/06/2013   Macular degeneration    Mild vascular neurocognitive disorder 02/08/2022   OAB (overactive bladder) 12/28/2019   Obstructive sleep apnea 09/06/2013   On Bi-PAP  Last Assessment & Plan:  Formatting of this note might be different from the original. - History of OSA, continue CPAP at night   Peritoneal dialysis catheter in place 02/24/2015   RLQ abdominal pain 04/19/2020   S/P AVR (aortic  valve replacement) 10/05/2021   SBO (small bowel obstruction) 05/2015   Scrotal edema 03/14/2017   Testicular ultrasound did show hydrocele  Will refer to urology regarding ongoign swelling, hydrocele.   Last Assessment & Plan:  Formatting of this note might be different from the original.   Testicular ultrasound did show hydrocele  Will refer to urology regarding ongoign swelling, hydrocele.   Shortness of breath    with exertion   Stage 3b chronic kidney disease (CKD) 03/22/2014   SVT (supraventricular tachycardia) 09/06/2013   Tremor of both hands 06/29/2017   Last Assessment & Plan:  Present since transplant, stable and unchanged Query from tacrolimus -Improved tremors noted and verbalized by pt - Prograf doses titrated per FK levels  Last Assessment & Plan:  Formatting of this note might be different from the original. Present since transplant, stable and unchanged Query from tacrolimus -Improved tremors noted and verbalized by pt - Prograf doses titr   Type 2 diabetes mellitus with hyperglycemia, with long-term current use of insulin 70/17/7939   Umbilical hernia    s/p repair 04/28/15   Past Surgical History:  Procedure Laterality Date   AORTIC VALVE REPLACEMENT N/A 10/05/2021   Procedure: AORTIC VALVE REPLACEMENT (AVR) WITH INSPIRIS RESILIA AORTIC VALVE SIZE 27MM;  Surgeon: [name], MD;  Location: [place];  Service: Open Heart Surgery;  Laterality: N/A;   AV FISTULA PLACEMENT Right 02/25/2015   Procedure: RIGHT ARTERIOVENOUS (AV) FISTULA CREATION;  Surgeon: Serafina Mitchell, MD;  Location: [place];  Service: Vascular;  Laterality: Right;   AV FISTULA PLACEMENT Right 09/15/2015   Procedure: ARTERIOVENOUS (AV) FISTULA CREATION- RIGHT ARM;  Surgeon: Serafina Mitchell, MD;  Location: [place];  Service: Vascular;  Laterality: Right;   CAPD INSERTION N/A 05/18/2014   Procedure: LAPAROSCOPIC INSERTION CONTINUOUS AMBULATORY PERITONEAL DIALYSIS  (CAPD) CATHETER;  Surgeon: Ralene Ok, MD;   Location: [place];  Service: General;  Laterality: N/A;   COLONOSCOPY     COLONOSCOPY WITH PROPOFOL N/A 07/12/2016   Procedure: COLONOSCOPY WITH PROPOFOL;  Surgeon: [name], MD;  Location: [facility];  Service: Endoscopy;  Laterality: N/A;   CORONARY ARTERY BYPASS GRAFT N/A 10/05/2021   Procedure: CORONARY ARTERY BYPASS GRAFTING (CABG) X 1, ON PUMP, USING LEFT INTERNAL MAMMARY ARTERY CONDUIT;  Surgeon: [name], MD;  Location: [place];  Service: Open Heart Surgery;  Laterality: N/A;   declotting of fistula  08/2015   ESOPHAGOGASTRODUODENOSCOPY (EGD) WITH PROPOFOL N/A 07/12/2016   Procedure: ESOPHAGOGASTRODUODENOSCOPY (EGD) WITH PROPOFOL;  Surgeon: [name], MD;  Location: [facility];  Service: Endoscopy;  Laterality: N/A;   FISTULA SUPERFICIALIZATION Right 02/07/2016   Procedure: RIIGHT UPPER ARM FISTULA SUPERFICIALIZATION;  Surgeon: Elam Dutch, MD;  Location: [place];  Service: Vascular;  Laterality: Right;   IJ catheter insertion     INSERTION OF DIALYSIS CATHETER Right 02/25/2015   Procedure:  INSERTION OF RIGHT INTERNAL JUGULAR DIALYSIS CATHETER;  Surgeon: Serafina Mitchell, MD;  Location: [place] [facility];  Service: Vascular;  Laterality: Right;   KIDNEY TRANSPLANT     LAPAROSCOPIC REPOSITIONING CAPD CATHETER N/A 06/16/2014   Procedure: LAPAROSCOPIC REPOSITIONING CAPD CATHETER;  Surgeon: Ralene Ok, MD;  Location: [place];  Service: General;  Laterality: N/A;   LAPAROSCOPY N/A 05/04/2015   Procedure: LAPAROSCOPY DIAGNOSTIC LYSIS OF ADHESIONS;  Surgeon: Coralie Keens, MD;  Location: [place];  Service: General;  Laterality: N/A;   MINOR REMOVAL OF PERITONEAL DIALYSIS CATHETER N/A 04/28/2015   Procedure:  REMOVAL OF PERITONEAL DIALYSIS CATHETER;  Surgeon: Coralie Keens, MD;  Location: [place];  Service: General;  Laterality: N/A;   NEPHRECTOMY Left 1974   RENAL BIOPSY Right 2012   REVISON OF ARTERIOVENOUS FISTULA Right 05/26/2015   Procedure: SUPERFICIALIZATION OF ARTERIOVENOUS  FISTULA WITH SIDE BRANCH LIGATIONS;  Surgeon: Serafina Mitchell, MD;  Location: [place];  Service: Vascular;  Laterality: Right;   RIGHT/LEFT HEART CATH AND CORONARY ANGIOGRAPHY N/A 08/22/2021   Procedure: RIGHT/LEFT HEART CATH AND CORONARY ANGIOGRAPHY;  Surgeon: Troy Sine, MD;  Location: [place] [facility];  Service: Cardiovascular;  Laterality: N/A;   SKIN CANCER EXCISION     right shoulder   SVT ABLATION N/A 11/23/2016   Procedure: SVT Ablation;  Surgeon: Will Meredith Leeds, MD;  Location: [place] [facility];  Service: Cardiovascular;  Laterality: N/A;   TEE WITHOUT CARDIOVERSION N/A 10/05/2021   Procedure: TRANSESOPHAGEAL ECHOCARDIOGRAM (TEE);  Surgeon: [name], MD;  Location: [place];  Service: Open Heart Surgery;  Laterality: N/A;   UMBILICAL HERNIA REPAIR N/A 05/18/2014   Procedure: HERNIA REPAIR UMBILICAL ADULT;  Surgeon: Ralene Ok, MD;  Location: [place];  Service: General;  Laterality: N/A;   UMBILICAL HERNIA REPAIR N/A 04/28/2015   Procedure: UMBILICAL HERNIA REPAIR WITH MESH;  Surgeon: Coralie Keens, MD;  Location: [place];  Service: General;  Laterality: N/A;   Patient Active Problem List   Diagnosis Date Noted   TIA (transient ischemic attack) 05/01/2022   Pyelonephritis 04/30/2022   History of ESBL Klebsiella pneumoniae infection 04/30/2022   DNR (do not resuscitate)/DNI(Do Not Intubate) 04/30/2022   Mild vascular neurocognitive disorder 02/08/2022   Atrial fibrillation 11/29/2021   Acute cystitis without hematuria 11/09/2021   Hyponatremia 11/09/2021   Cerebrovascular accident (CVA) due to embolism of precerebral artery    History of COVID-19 2023   S/P AVR (aortic valve replacement) 10/05/2021   Exertional dyspnea    Combined arterial insufficiency and corporo-venous occlusive erectile dysfunction 12/28/2019   OAB (overactive bladder) 12/28/2019   Inguinal hernia without obstruction or gangrene 06/30/2017   Tremor of both hands 06/29/2017   Hydrocele,  acquired 04/07/2017   Scrotal edema 03/14/2017   Diabetic neuropathy 03/01/2017   Kidney transplanted 02/26/2017   Immunosuppressed status 02/26/2017   Diverticulosis of colon without hemorrhage    Morbid obesity ([place]) 02/03/2016   Type 2 diabetes mellitus with hyperglycemia, with long-term current use of insulin 07/14/2015   Chronic renal failure    Anemia in chronic kidney disease 03/30/2015   Benign essential hypertension 07/30/2014   Stage 3b chronic kidney disease (CKD) 03/22/2014   Fatigue 01/08/2014   History of unilateral nephrectomy 09/06/2013   SVT (supraventricular tachycardia) ([place]) 09/06/2013   Lower extremity edema 09/06/2013   Obstructive sleep apnea 09/06/2013   Hypothyroidism 09/06/2013   Hyperlipidemia 09/06/2013    ONSET DATE: 05/01/22  REFERRING DIAG: R42 (ICD-10-CM) - Dizziness and giddiness  THERAPY DIAG:  Dizziness and giddiness  Unsteadiness on feet  Other abnormalities of gait and mobility  Rationale for Evaluation and Treatment Rehabilitation  SUBJECTIVE:   SUBJECTIVE STATEMENT: Had to finish up his HHPT before starting back with vestibular rehab. Could not find his vestibular HEP handout. Reports that one of his medications makes him "loopy stupid." Reports that his doctors "don't care, I've tried." Reports that he feels that his walking is more steady since finishing up PT. Unsure if he gets dizzy when getting out of bed d/t LBP.   Pt accompanied by: self  PERTINENT HISTORY: a-fib, HTN (hx HTN urgency), CVA, CAD, diapetic neuropathy, ESRD, HLD, kidney transplant 2018, macular degeneration, mild vascular neurocognitive disorder, SVT, DMII, AV fistula, CABG 6160, embolic CVA subcortical L frontal lobe with [name] residual weakness.   PAIN:  Are you having pain? Yes: NPRS scale: 3/10 Pain location: forehead Pain description: headache Aggravating factors: "being ill as a hornet" Relieving factors: get somewhere silent  PRECAUTIONS:  Fall  PATIENT GOALS improve dizziness  OBJECTIVE:     TODAY'S TREATMENT: 06/12/22 Activity Comments  Sitting horizontal VOR 2x30" C/o 2/10 wooziness; cues for small but quick head movements  Sitting vertical VOR 2x30"  C/o 2/10 increased wooziness; cues for small but quick head movements  STS + eyes on target 5x, STS with EC 5x  Chair in front for comfort; wide BOS but fairly good stability   Standing with EC 2x30" Moderate sway with chair in front; tendency to keep R knee flexed; Cues for "toes down" to reverse posterior LOB/wt shift  Romberg 2x30" Cues for "toes down" to reverse posterior LOB/wt shift  1/2 turns to targets in II bars 2/10 dizziness; able to increase speed with use of UE support on II bars    HOME EXERCISE PROGRAM Last updated: 06/12/22 Access Code: [email or web address] URL: [email or web address] Date: 06/12/2022 Prepared by: [facility]  Exercises - Seated Gaze Stabilization with Head Rotation  - 1 x daily - 5 x weekly - 2-3 sets - 30 sec hold - Seated Gaze Stabilization with Head Nod  - 1 x daily - 5 x weekly - 2-3 sets - 30 sec hold - Standing Balance in Corner with Eyes Closed  - 1 x daily - 5 x weekly - 2-3 sets - 30 sec hold - Standing Near Stance in Corner  - 1 x daily - 5 x weekly - 2-3 sets - 30 sec hold    PATIENT EDUCATION: Education details: edu on HEP and appropriate level of dizziness with HEP Person educated: Patient Education method: Explanation, Demonstration, Tactile cues, Verbal cues, and Handouts Education comprehension: verbalized understanding and returned demonstration   Below measures were taken at time of initial evaluation unless otherwise specified:   Vitals: 142/78 mmHg 98%, 71bpm  DIAGNOSTIC FINDINGS:  05/01/22 head MRI: Mild-to-moderate chronic microvascular ischemic disease with a few scattered remote lacunar infarcts involving the left hemispheric cerebral white matter.  05/01/22  head MRA: Irregularity with possible 8 mm outpouching extending inferiorly from the left ICA terminus. While this finding is favored to be artifactual, a possible aneurysm is difficult to exclude  05/01/22 neck MRA: Grossly patent carotid artery systems and vertebral arteries within the neck. No obvious hemodynamically significant stenosis.   COGNITION: Overall cognitive status: Within functional limits for tasks assessed   SENSATION: Reports limited sensation in B hands/feet  POSTURE: rounded shoulders, forward head, and frequently bending forward/readjusting in seat; slightly SOB which patient reports  is normal for him  GAIT: Gait pattern:  Gait very slow and cautious with L toe out Assistive device utilized: Single point cane  PATIENT SURVEYS:  FOTO [phone]   VESTIBULAR ASSESSMENT   GENERAL OBSERVATION: patient is wearing progressive lenses which he wears most time      OCULOMOTOR EXAM:   Ocular Alignment: normal; L orbit slightly elevated    Ocular ROM: No Limitations   Spontaneous Nystagmus: absent   Gaze-Induced Nystagmus: absent   Smooth Pursuits: saccades and superiorly 1-2    Saccades: hypometric/undershoots and slow more evident vertically than horizontally    Convergence/Divergence: reports diplopia at any distance     VESTIBULAR - OCULAR REFLEX:    Slow VOR: Comment: very slow and guarded horizontal; slightly better vertical   VOR Cancellation: Normal; c/o slight increase in dizziness    Head-Impulse Test: HIT Right: positive HIT Left: covert positive        POSITIONAL TESTING:  Right Roll Test: negative Left Roll Test: c/o increased dizziness; Duration: 10 sec Right Sidelying: negative except for c/o woozing upon sitting up Left Sidelying: negative except for c/o woozing upon sitting up    VESTIBULAR TREATMENT:  PATIENT EDUCATION: Education details: prognosis, POC, HEP; educated on using RW rather than SPC d/t frequency of falls with hx of head  trauma  Person educated: Patient Education method: Explanation, Demonstration, Tactile cues, Verbal cues, and Handouts Education comprehension: verbalized understanding and returned demonstration   GOALS: Goals reviewed with patient? Yes  SHORT TERM GOALS: Target date: 06/06/2022  Patient to be independent with initial HEP. Baseline: HEP initiated Goal status: IN PROGRESS    LONG TERM GOALS: Target date: 07/11/2022  Patient to be independent with advanced HEP. Baseline: Not yet initiated  Goal status: IN PROGRESS  Patient to report 0/10 dizziness with standing vertical and horizontal VOR for 30 seconds. Baseline: Unable Goal status: IN PROGRESS  Patient will report 0/10 dizziness with bed mobility.  Baseline: Symptomatic  Goal status: IN PROGRESS  Patient to demonstrate mild-moderate sway with M-CTSIB condition with eyes closed/firm surface in order to improve safety in environments with dim lighting. Baseline: NT Goal status: IN PROGRESS  Patient to score at least 43/56 on Berg in order to decrease risk of falls. Baseline: NT Goal status: IN PROGRESS    ASSESSMENT:  CLINICAL IMPRESSION:  Patient arrived to session with report of completing HHPT for his gait; notes this was the reason for his hiatus from vestibular rehab. Upon observation, patient appears slightly steadier with gait with SPC than before. Reports feeling loopy from Nortriptyline and notes that he has notified his MD about this side effect. Worked on seated VOR activities with cueing for increase pace which brought on mild dizziness. Patient hesitant with small balance challenges; will require increased practice to build confidence. Opted not to try habituation activities today d/t report of recently hurting his back and pain with bed mobility. Provided patient with updated HEP. Patient reported understanding of all edu provided and without complaints at end of session.    OBJECTIVE IMPAIRMENTS Abnormal  gait, decreased balance, decreased endurance, decreased mobility, difficulty walking, dizziness, postural dysfunction, and pain.   ACTIVITY LIMITATIONS carrying, lifting, bending, sitting, standing, squatting, sleeping, stairs, transfers, bed mobility, bathing, toileting, dressing, reach over head, hygiene/grooming, and locomotion level  PARTICIPATION LIMITATIONS: meal prep, cleaning, laundry, driving, shopping, community activity, and church  PERSONAL FACTORS Age, Fitness, Past/current experiences, Time since onset of injury/illness/exacerbation, and 3+ comorbidities: a-fib, HTN (hx HTN urgency), CVA, CAD,  diapetic neuropathy, ESRD, HLD, kidney transplant 2018, macular degeneration, mild vascular neurocognitive disorder, SVT, DMII, AV fistula, CABG 0601, embolic CVA subcortical L frontal lobe with [name] residual weakness.  are also affecting patient's functional outcome.   REHAB POTENTIAL: Good  CLINICAL DECISION MAKING: Evolving/moderate complexity  EVALUATION COMPLEXITY: Moderate   PLAN: PT FREQUENCY: 2x/week  PT DURATION: 8 weeks  PLANNED INTERVENTIONS: Therapeutic exercises, Therapeutic activity, Neuromuscular re-education, Balance training, Gait training, Patient/Family education, Self Care, Joint mobilization, Stair training, Vestibular training, Canalith repositioning, Dry Needling, Electrical stimulation, Cryotherapy, Moist heat, Manual therapy, and Re-evaluation  PLAN FOR NEXT SESSION: reassess HEP; assess Berg, MCTSIB, progress VOR and habituation    Janene Harvey, PT, DPT 06/12/22 3:32 PM  [facility] at [facility] [address], [place], [place] [address] Phone # [phone] Fax # [phone]

## 2022-06-13 ENCOUNTER — Other Ambulatory Visit: Payer: Self-pay | Admitting: Neurology

## 2022-06-14 ENCOUNTER — Encounter: Payer: Self-pay | Admitting: Physical Therapy

## 2022-06-14 ENCOUNTER — Encounter: Payer: 59 | Admitting: Physical Therapy

## 2022-06-14 ENCOUNTER — Ambulatory Visit: Payer: 59 | Admitting: Physical Therapy

## 2022-06-14 ENCOUNTER — Encounter: Payer: Self-pay | Admitting: Neurology

## 2022-06-14 DIAGNOSIS — R42 Dizziness and giddiness: Secondary | ICD-10-CM

## 2022-06-14 DIAGNOSIS — R2689 Other abnormalities of gait and mobility: Secondary | ICD-10-CM

## 2022-06-14 DIAGNOSIS — R2681 Unsteadiness on feet: Secondary | ICD-10-CM

## 2022-06-14 NOTE — Therapy (Signed)
OUTPATIENT PHYSICAL THERAPY VESTIBULAR TREATMENT     Patient Name: Robert [name] MRN: [id number] DOB:Jan 31, 1961, 61 y.o., male Today's Date: 06/14/2022  PCP: Haywood Pao, MD REFERRING PROVIDER: Geoffery Lyons, NP   PT End of Session - 06/14/22 1404     Visit Number 3    Number of Visits 17    Date for PT Re-Evaluation 07/11/22    Authorization Type [employer]    PT Start Time 5573    PT Stop Time 2202    PT Time Calculation (min) 40 min    Activity Tolerance Patient tolerated treatment well    Behavior During Therapy [facility] for tasks assessed/performed               Past Medical History:  Diagnosis Date   Acute cystitis without hematuria 11/09/2021   AKI (acute kidney injury) 08/08/2017   Elevated sCr to 2.5 (baseline ~1.7) noted at OSH - Subsequent transfer to [facility]  - Currently on IVMF in the setting of diarrhea and AKI - sCr at baseline at time of discharge   Anemia in chronic kidney disease 03/30/2015   Anticoagulated on Coumadin 11/09/2021   Aortic valve stenosis    Arthritis    Atrial fibrillation 11/29/2021   Benign essential hypertension 07/30/2014   prior to transplant pt took Metorpolol tartrate 82m qd on off HD days   Benign neoplasm of transverse colon    Cancer    skin ca right shoulder, plastic dsyplasia, pre-Ca polpys removed on Colonoscopy- 07/2014   Cerebrovascular accident (CVA) due to embolism of precerebral artery    Chronic renal failure    Colon polyps 07/23/2014   Tubular adenomas x 5   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   Diverticulosis of colon without hemorrhage    Eczema    ESRD (end stage renal disease)    hemodialysis 06/2014-02/2017, s/p living unrelated kidney transplant 02/26/17   Exertional dyspnea     Fatigue 01/08/2014   Fissure of skin 03/08/2020   GI bleed 07/26/2017   Headache    Heart murmur    aortic stenosis (moderate-severe 04/2021)   History of COVID-19 2023   History of unilateral nephrectomy 09/06/2013   Hydrocele, acquired 04/07/2017   Hyperlipidemia 09/06/2013   Hypertensive urgency 10/21/2021   Hyponatremia 11/09/2021   Hypothyroidism    Immunosuppressed status 02/26/2017   Induction agent: Solumedrol - Envarsus 332mdaily  - Home dose decreased from 67m39mo 3mg83m the setting of FK 15.7 while inpatient   - Previously on Prograf, though d/c'ed during previous admission; suspected this may be causing hallucinations  - Cellcept 1000mg84mH  - Prednisone 67mg d54my   Inguinal hernia without obstruction or gangrene 06/30/2017   Right inguinal wall defect appreciated by CT imaging [place] and on exam Dr. Rege tPaulla Fore Dr. Rege hPaulla Foreiscussed hernia repair with mesh at some point in near future  Last Assessment & Plan:  Formatting of this note might be different from the original. Right inguinal wall defect appreciated by CT imaging [place] and on exam Dr. Rege tPaulla Fore Dr. Rege hPaulla Foreiscussed hern   Kidney transplanted 02/26/2017   Date of Transplant = 02/26/17  ABO (Donor/Recipient) = compatible Blood type: O+/A+ DonorType: Living donor, unrelated Allograft type:Left kidney Donor  anatomy: 2 arteries, 1 veins and single ureter  Pants-to-skirt anastomosis of dual renal arteries on the backtable. Donor Kidney Bx: Not applicable Allograft injury/complications: None Ne   Lower extremity edema 09/06/2013   Macular degeneration    Mild vascular neurocognitive disorder 02/08/2022   OAB (overactive bladder) 12/28/2019   Obstructive sleep apnea 09/06/2013   On Bi-PAP  Last Assessment & Plan:  Formatting of this note might be different from the original. - History of OSA, continue CPAP at night   Peritoneal dialysis catheter in place 02/24/2015   RLQ abdominal pain 04/19/2020   S/P AVR  (aortic valve replacement) 10/05/2021   SBO (small bowel obstruction) 05/2015   Scrotal edema 03/14/2017   Testicular ultrasound did show hydrocele  Will refer to urology regarding ongoign swelling, hydrocele.   Last Assessment & Plan:  Formatting of this note might be different from the original.   Testicular ultrasound did show hydrocele  Will refer to urology regarding ongoign swelling, hydrocele.   Shortness of breath    with exertion   Stage 3b chronic kidney disease (CKD) 03/22/2014   SVT (supraventricular tachycardia) 09/06/2013   Tremor of both hands 06/29/2017   Last Assessment & Plan:  Present since transplant, stable and unchanged Query from tacrolimus -Improved tremors noted and verbalized by pt - Prograf doses titrated per FK levels  Last Assessment & Plan:  Formatting of this note might be different from the original. Present since transplant, stable and unchanged Query from tacrolimus -Improved tremors noted and verbalized by pt - Prograf doses titr   Type 2 diabetes mellitus with hyperglycemia, with long-term current use of insulin 87/56/4332   Umbilical hernia    s/p repair 04/28/15   Past Surgical History:  Procedure Laterality Date   AORTIC VALVE REPLACEMENT N/A 10/05/2021   Procedure: AORTIC VALVE REPLACEMENT (AVR) WITH INSPIRIS RESILIA AORTIC VALVE SIZE 27MM;  Surgeon: [name], MD;  Location: [place];  Service: Open Heart Surgery;  Laterality: N/A;   AV FISTULA PLACEMENT Right 02/25/2015   Procedure: RIGHT ARTERIOVENOUS (AV) FISTULA CREATION;  Surgeon: Serafina Mitchell, MD;  Location: [place];  Service: Vascular;  Laterality: Right;   AV FISTULA PLACEMENT Right 09/15/2015   Procedure: ARTERIOVENOUS (AV) FISTULA CREATION- RIGHT ARM;  Surgeon: Serafina Mitchell, MD;  Location: [place];  Service: Vascular;  Laterality: Right;   CAPD INSERTION N/A 05/18/2014   Procedure: LAPAROSCOPIC INSERTION CONTINUOUS AMBULATORY PERITONEAL DIALYSIS  (CAPD) CATHETER;  Surgeon: Ralene Ok,  MD;  Location: [place];  Service: General;  Laterality: N/A;   COLONOSCOPY     COLONOSCOPY WITH PROPOFOL N/A 07/12/2016   Procedure: COLONOSCOPY WITH PROPOFOL;  Surgeon: [name], MD;  Location: [facility];  Service: Endoscopy;  Laterality: N/A;   CORONARY ARTERY BYPASS GRAFT N/A 10/05/2021   Procedure: CORONARY ARTERY BYPASS GRAFTING (CABG) X 1, ON PUMP, USING LEFT INTERNAL MAMMARY ARTERY CONDUIT;  Surgeon: [name], MD;  Location: [place];  Service: Open Heart Surgery;  Laterality: N/A;   declotting of fistula  08/2015   ESOPHAGOGASTRODUODENOSCOPY (EGD) WITH PROPOFOL N/A 07/12/2016   Procedure: ESOPHAGOGASTRODUODENOSCOPY (EGD) WITH PROPOFOL;  Surgeon: [name], MD;  Location: [facility];  Service: Endoscopy;  Laterality: N/A;   FISTULA SUPERFICIALIZATION Right 02/07/2016   Procedure: RIIGHT UPPER ARM FISTULA SUPERFICIALIZATION;  Surgeon: Elam Dutch, MD;  Location: [place];  Service: Vascular;  Laterality: Right;   IJ catheter insertion     INSERTION OF DIALYSIS CATHETER Right 02/25/2015  Procedure: INSERTION OF RIGHT INTERNAL JUGULAR DIALYSIS CATHETER;  Surgeon: Serafina Mitchell, MD;  Location: [place];  Service: Vascular;  Laterality: Right;   KIDNEY TRANSPLANT     LAPAROSCOPIC REPOSITIONING CAPD CATHETER N/A 06/16/2014   Procedure: LAPAROSCOPIC REPOSITIONING CAPD CATHETER;  Surgeon: Ralene Ok, MD;  Location: [place];  Service: General;  Laterality: N/A;   LAPAROSCOPY N/A 05/04/2015   Procedure: LAPAROSCOPY DIAGNOSTIC LYSIS OF ADHESIONS;  Surgeon: Coralie Keens, MD;  Location: [place];  Service: General;  Laterality: N/A;   MINOR REMOVAL OF PERITONEAL DIALYSIS CATHETER N/A 04/28/2015   Procedure:  REMOVAL OF PERITONEAL DIALYSIS CATHETER;  Surgeon: Coralie Keens, MD;  Location: [place];  Service: General;  Laterality: N/A;   NEPHRECTOMY Left 1974   RENAL BIOPSY Right 2012   REVISON OF ARTERIOVENOUS FISTULA Right 05/26/2015   Procedure: SUPERFICIALIZATION OF ARTERIOVENOUS  FISTULA WITH SIDE BRANCH LIGATIONS;  Surgeon: Serafina Mitchell, MD;  Location: [place];  Service: Vascular;  Laterality: Right;   RIGHT/LEFT HEART CATH AND CORONARY ANGIOGRAPHY N/A 08/22/2021   Procedure: RIGHT/LEFT HEART CATH AND CORONARY ANGIOGRAPHY;  Surgeon: Troy Sine, MD;  Location: [place] [facility];  Service: Cardiovascular;  Laterality: N/A;   SKIN CANCER EXCISION     right shoulder   SVT ABLATION N/A 11/23/2016   Procedure: SVT Ablation;  Surgeon: Will Meredith Leeds, MD;  Location: [place] [facility];  Service: Cardiovascular;  Laterality: N/A;   TEE WITHOUT CARDIOVERSION N/A 10/05/2021   Procedure: TRANSESOPHAGEAL ECHOCARDIOGRAM (TEE);  Surgeon: [name], MD;  Location: [place];  Service: Open Heart Surgery;  Laterality: N/A;   UMBILICAL HERNIA REPAIR N/A 05/18/2014   Procedure: HERNIA REPAIR UMBILICAL ADULT;  Surgeon: Ralene Ok, MD;  Location: [place];  Service: General;  Laterality: N/A;   UMBILICAL HERNIA REPAIR N/A 04/28/2015   Procedure: UMBILICAL HERNIA REPAIR WITH MESH;  Surgeon: Coralie Keens, MD;  Location: [place];  Service: General;  Laterality: N/A;   Patient Active Problem List   Diagnosis Date Noted   TIA (transient ischemic attack) 05/01/2022   Pyelonephritis 04/30/2022   History of ESBL Klebsiella pneumoniae infection 04/30/2022   DNR (do not resuscitate)/DNI(Do Not Intubate) 04/30/2022   Mild vascular neurocognitive disorder 02/08/2022   Atrial fibrillation 11/29/2021   Acute cystitis without hematuria 11/09/2021   Hyponatremia 11/09/2021   Cerebrovascular accident (CVA) due to embolism of precerebral artery    History of COVID-19 2023   S/P AVR (aortic valve replacement) 10/05/2021   Exertional dyspnea    Combined arterial insufficiency and corporo-venous occlusive erectile dysfunction 12/28/2019   OAB (overactive bladder) 12/28/2019   Inguinal hernia without obstruction or gangrene 06/30/2017   Tremor of both hands 06/29/2017   Hydrocele,  acquired 04/07/2017   Scrotal edema 03/14/2017   Diabetic neuropathy 03/01/2017   Kidney transplanted 02/26/2017   Immunosuppressed status 02/26/2017   Diverticulosis of colon without hemorrhage    Morbid obesity ([place]) 02/03/2016   Type 2 diabetes mellitus with hyperglycemia, with long-term current use of insulin 07/14/2015   Chronic renal failure    Anemia in chronic kidney disease 03/30/2015   Benign essential hypertension 07/30/2014   Stage 3b chronic kidney disease (CKD) 03/22/2014   Fatigue 01/08/2014   History of unilateral nephrectomy 09/06/2013   SVT (supraventricular tachycardia) ([place]) 09/06/2013   Lower extremity edema 09/06/2013   Obstructive sleep apnea 09/06/2013   Hypothyroidism 09/06/2013   Hyperlipidemia 09/06/2013    ONSET DATE: 05/01/22  REFERRING DIAG: R42 (ICD-10-CM) - Dizziness and giddiness  THERAPY DIAG:  Dizziness and giddiness  Unsteadiness on feet  Other abnormalities of gait and mobility  Rationale for Evaluation and Treatment Rehabilitation  SUBJECTIVE:   SUBJECTIVE STATEMENT: Not a good day.  Been doing the exercises, and then my vision has "split".  The right eye "dropped" whenever I looked at things earlier today.  So I didn't do the exercises today.  No spinning, just anxious. Pt accompanied by: self  PERTINENT HISTORY: a-fib, HTN (hx HTN urgency), CVA, CAD, diapetic neuropathy, ESRD, HLD, kidney transplant 2018, macular degeneration, mild vascular neurocognitive disorder, SVT, DMII, AV fistula, CABG 7416, embolic CVA subcortical L frontal lobe with [name] residual weakness.   PAIN:  Are you having pain? Yes: NPRS scale: 3/10 Pain location: forehead Pain description: headache Aggravating factors: "being ill as a hornet" Relieving factors: get somewhere silent  PRECAUTIONS: Fall  PATIENT GOALS improve dizziness  OBJECTIVE:    TODAY'S TREATMENT: 06/14/2022 Activity Comments  Spent time reviewing pt's HEP from previous visit.   Performed with modifications-see below General tips to have a larger target and be about 3 ft away from target  Sitting horizontal VOR 2x30" Rates 1-2/10 wooziness, cues for small, quick head movements  Sitting vertical VOR 2x30"  Rates 2/10 wooziness, decreased coordination of R eye-better with slowed, smaller head movements  Standing with EC 2x30" Performs well  Romberg 2x30" Performs well  1/2 turns to targets in II bars 2/10 dizziness;does not hold to bars  Quarter turns in bars with head turns to targets 2/10 dizziness, slowed pace      HOME EXERCISE PROGRAM Last updated: 06/12/22 Access Code: [email or web address] URL: [email or web address] Date: 06/12/2022 Prepared by: [facility]  Exercises - Seated Gaze Stabilization with Head Rotation  - 1 x daily - 5 x weekly - 2-3 sets - 30 sec hold - Seated Gaze Stabilization with Head Nod  - 1 x daily - 5 x weekly - 2-3 sets - 30 sec hold - Standing Balance in Corner with Eyes Closed  - 1 x daily - 5 x weekly - 2-3 sets - 30 sec hold - Standing Near Stance in Corner  - 1 x daily - 5 x weekly - 2-3 sets - 30 sec hold    PATIENT EDUCATION: Education details: Review/edu on HEP and appropriate level of dizziness with HEP Person educated: Patient Education method: Explanation, Demonstration, Tactile cues, Verbal cues, and Handouts Education comprehension: verbalized understanding and returned demonstration   Below measures were taken at time of initial evaluation unless otherwise specified:   Vitals: 142/78 mmHg 98%, 71bpm  DIAGNOSTIC FINDINGS:  05/01/22 head MRI: Mild-to-moderate chronic microvascular ischemic disease with a few scattered remote lacunar infarcts involving the left hemispheric cerebral white matter.  05/01/22 head MRA: Irregularity with possible 8 mm outpouching extending inferiorly from the left ICA terminus. While this finding is favored to be artifactual, a possible aneurysm  is difficult to exclude  05/01/22 neck MRA: Grossly patent carotid artery systems and vertebral arteries within the neck. No obvious hemodynamically significant stenosis.   COGNITION: Overall cognitive status: Within functional limits for tasks assessed   SENSATION: Reports limited sensation in B hands/feet  POSTURE: rounded shoulders, forward head, and frequently bending forward/readjusting in seat; slightly SOB which patient reports is normal for him  GAIT: Gait pattern:  Gait very slow and cautious with L toe out Assistive device utilized: Single point cane  PATIENT SURVEYS:  FOTO [phone]   VESTIBULAR ASSESSMENT   GENERAL OBSERVATION:  patient is wearing progressive lenses which he wears most time      OCULOMOTOR EXAM:   Ocular Alignment: normal; L orbit slightly elevated    Ocular ROM: No Limitations   Spontaneous Nystagmus: absent   Gaze-Induced Nystagmus: absent   Smooth Pursuits: saccades and superiorly 1-2    Saccades: hypometric/undershoots and slow more evident vertically than horizontally    Convergence/Divergence: reports diplopia at any distance     VESTIBULAR - OCULAR REFLEX:    Slow VOR: Comment: very slow and guarded horizontal; slightly better vertical   VOR Cancellation: Normal; c/o slight increase in dizziness    Head-Impulse Test: HIT Right: positive HIT Left: covert positive        POSITIONAL TESTING:  Right Roll Test: negative Left Roll Test: c/o increased dizziness; Duration: 10 sec Right Sidelying: negative except for c/o woozing upon sitting up Left Sidelying: negative except for c/o woozing upon sitting up    VESTIBULAR TREATMENT:  PATIENT EDUCATION: Education details: prognosis, POC, HEP; educated on using RW rather than SPC d/t frequency of falls with hx of head trauma  Person educated: Patient Education method: Explanation, Demonstration, Tactile cues, Verbal cues, and Handouts Education comprehension: verbalized understanding and  returned demonstration   GOALS: Goals reviewed with patient? Yes  SHORT TERM GOALS: Target date: 06/06/2022  Patient to be independent with initial HEP. Baseline: HEP initiated Goal status:PARTIALLY MET-needs cues, but is performing at home 06/14/2022    LONG TERM GOALS: Target date: 07/11/2022  Patient to be independent with advanced HEP. Baseline: Not yet initiated  Goal status: IN PROGRESS  Patient to report 0/10 dizziness with standing vertical and horizontal VOR for 30 seconds. Baseline: Unable Goal status: IN PROGRESS  Patient will report 0/10 dizziness with bed mobility.  Baseline: Symptomatic  Goal status: IN PROGRESS  Patient to demonstrate mild-moderate sway with M-CTSIB condition with eyes closed/firm surface in order to improve safety in environments with dim lighting. Baseline: NT Goal status: IN PROGRESS  Patient to score at least 43/56 on Berg in order to decrease risk of falls. Baseline: NT Goal status: IN PROGRESS    ASSESSMENT:  CLINICAL IMPRESSION: Reviewed HEP given last visit; pt reports having some vertical diplopia this morning; not present at session today.  Of note, he has history of diploplia at far distances and does report target for VOR exercises being >34f away; he did well today with exercises at closer distance. Did provide additional education/reinforced optimal technique for performing exercises.  R eye does have some slow tracking with vertical VOR.  Pt's dizziness does not go above 2/10 throughout session.  He performs turns slowly in parallel bars, not using UE support.  He will continue to benefit from skilled PT towards goals for overall improved functional mobility and decreased dizziness.  OBJECTIVE IMPAIRMENTS Abnormal gait, decreased balance, decreased endurance, decreased mobility, difficulty walking, dizziness, postural dysfunction, and pain.   ACTIVITY LIMITATIONS carrying, lifting, bending, sitting, standing, squatting, sleeping,  stairs, transfers, bed mobility, bathing, toileting, dressing, reach over head, hygiene/grooming, and locomotion level  PARTICIPATION LIMITATIONS: meal prep, cleaning, laundry, driving, shopping, community activity, and church  PERSONAL FACTORS Age, Fitness, Past/current experiences, Time since onset of injury/illness/exacerbation, and 3+ comorbidities: a-fib, HTN (hx HTN urgency), CVA, CAD, diapetic neuropathy, ESRD, HLD, kidney transplant 2018, macular degeneration, mild vascular neurocognitive disorder, SVT, DMII, AV fistula, CABG 25809 embolic CVA subcortical L frontal lobe with [name] residual weakness.  are also affecting patient's functional outcome.   REHAB POTENTIAL: Good  CLINICAL DECISION  MAKING: Evolving/moderate complexity  EVALUATION COMPLEXITY: Moderate   PLAN: PT FREQUENCY: 2x/week  PT DURATION: 8 weeks  PLANNED INTERVENTIONS: Therapeutic exercises, Therapeutic activity, Neuromuscular re-education, Balance training, Gait training, Patient/Family education, Self Care, Joint mobilization, Stair training, Vestibular training, Canalith repositioning, Dry Needling, Electrical stimulation, Cryotherapy, Moist heat, Manual therapy, and Re-evaluation  PLAN FOR NEXT SESSION: Turns, assess Berg, MCTSIB, progress VOR and habituation, compliant surfaces, ?convergence exercises?   Mady Haagensen, PT 06/14/22 5:20 PM Phone: [phone] Fax: [phone]   [facility], [address], [place] [address] Phone # [phone] Fax # [phone]

## 2022-06-18 ENCOUNTER — Ambulatory Visit: Payer: 59 | Admitting: Physical Therapy

## 2022-06-18 ENCOUNTER — Encounter: Payer: Self-pay | Admitting: Physical Therapy

## 2022-06-18 DIAGNOSIS — R2689 Other abnormalities of gait and mobility: Secondary | ICD-10-CM

## 2022-06-18 DIAGNOSIS — R42 Dizziness and giddiness: Secondary | ICD-10-CM | POA: Diagnosis not present

## 2022-06-18 DIAGNOSIS — R2681 Unsteadiness on feet: Secondary | ICD-10-CM

## 2022-06-18 NOTE — Therapy (Signed)
OUTPATIENT PHYSICAL THERAPY VESTIBULAR TREATMENT     Patient Name: Robert [name] MRN: [id number] DOB:09/14/1961, 61 y.o., male Today's Date: 06/19/2022  PCP: Haywood Pao, MD REFERRING PROVIDER: Geoffery Lyons, NP   PT End of Session - 06/18/22 1017     Visit Number 4    Number of Visits 17    Date for PT Re-Evaluation 07/11/22    Authorization Type UHC    PT Start Time 6381    PT Stop Time 1058    PT Time Calculation (min) 40 min    Equipment Utilized During Treatment Gait belt    Activity Tolerance Patient limited by lethargy;Patient limited by fatigue    Behavior During Therapy [facility] for tasks assessed/performed                Past Medical History:  Diagnosis Date   Acute cystitis without hematuria 11/09/2021   AKI (acute kidney injury) 08/08/2017   Elevated sCr to 2.5 (baseline ~1.7) noted at OSH - Subsequent transfer to [facility]  - Currently on IVMF in the setting of diarrhea and AKI - sCr at baseline at time of discharge   Anemia in chronic kidney disease 03/30/2015   Anticoagulated on Coumadin 11/09/2021   Aortic valve stenosis    Arthritis    Atrial fibrillation 11/29/2021   Benign essential hypertension 07/30/2014   prior to transplant pt took Metorpolol tartrate 84m qd on off HD days   Benign neoplasm of transverse colon    Cancer    skin ca right shoulder, plastic dsyplasia, pre-Ca polpys removed on Colonoscopy- 07/2014   Cerebrovascular accident (CVA) due to embolism of precerebral artery    Chronic renal failure    Colon polyps 07/23/2014   Tubular adenomas x 5   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   Diverticulosis of colon without hemorrhage    Eczema    ESRD (end stage renal disease)    hemodialysis  06/2014-02/2017, s/p living unrelated kidney transplant 02/26/17   Exertional dyspnea    Fatigue 01/08/2014   Fissure of skin 03/08/2020   GI bleed 07/26/2017   Headache    Heart murmur    aortic stenosis (moderate-severe 04/2021)   History of COVID-19 2023   History of unilateral nephrectomy 09/06/2013   Hydrocele, acquired 04/07/2017   Hyperlipidemia 09/06/2013   Hypertensive urgency 10/21/2021   Hyponatremia 11/09/2021   Hypothyroidism    Immunosuppressed status 02/26/2017   Induction agent: Solumedrol - Envarsus 336mdaily  - Home dose decreased from 5m24mo 3mg72m the setting of FK 15.7 while inpatient   - Previously on Prograf, though d/c'ed during previous admission; suspected this may be causing hallucinations  - Cellcept 1000mg51mH  - Prednisone 5mg d42my   Inguinal hernia without obstruction or gangrene 06/30/2017   Right inguinal wall defect appreciated by CT imaging [place] and on exam Dr. Rege tPaulla Fore Dr. Rege hPaulla Foreiscussed hernia repair with mesh at some point in near future  Last Assessment & Plan:  Formatting of this note might be different from the original. Right inguinal wall defect appreciated by CT imaging [place] and on exam Dr. Rege tPaulla Fore Dr. Rege hPaulla Foreiscussed hern   Kidney transplanted 02/26/2017   Date of Transplant = 02/26/17  ABO (Donor/Recipient) =  compatible Blood type: O+/A+ DonorType: Living donor, unrelated Allograft type:Left kidney Donor anatomy: 2 arteries, 1 veins and single ureter  Pants-to-skirt anastomosis of dual renal arteries on the backtable. Donor Kidney Bx: Not applicable Allograft injury/complications: None Ne   Lower extremity edema 09/06/2013   Macular degeneration    Mild vascular neurocognitive disorder 02/08/2022   OAB (overactive bladder) 12/28/2019   Obstructive sleep apnea 09/06/2013   On Bi-PAP  Last Assessment & Plan:  Formatting of this note might be different from the original. - History of OSA, continue CPAP at night   Peritoneal  dialysis catheter in place 02/24/2015   RLQ abdominal pain 04/19/2020   S/P AVR (aortic valve replacement) 10/05/2021   SBO (small bowel obstruction) 05/2015   Scrotal edema 03/14/2017   Testicular ultrasound did show hydrocele  Will refer to urology regarding ongoign swelling, hydrocele.   Last Assessment & Plan:  Formatting of this note might be different from the original.   Testicular ultrasound did show hydrocele  Will refer to urology regarding ongoign swelling, hydrocele.   Shortness of breath    with exertion   Stage 3b chronic kidney disease (CKD) 03/22/2014   SVT (supraventricular tachycardia) 09/06/2013   Tremor of both hands 06/29/2017   Last Assessment & Plan:  Present since transplant, stable and unchanged Query from tacrolimus -Improved tremors noted and verbalized by pt - Prograf doses titrated per FK levels  Last Assessment & Plan:  Formatting of this note might be different from the original. Present since transplant, stable and unchanged Query from tacrolimus -Improved tremors noted and verbalized by pt - Prograf doses titr   Type 2 diabetes mellitus with hyperglycemia, with long-term current use of insulin 04/02/9484   Umbilical hernia    s/p repair 04/28/15   Past Surgical History:  Procedure Laterality Date   AORTIC VALVE REPLACEMENT N/A 10/05/2021   Procedure: AORTIC VALVE REPLACEMENT (AVR) WITH INSPIRIS RESILIA AORTIC VALVE SIZE 27MM;  Surgeon: [name], MD;  Location: [place];  Service: Open Heart Surgery;  Laterality: N/A;   AV FISTULA PLACEMENT Right 02/25/2015   Procedure: RIGHT ARTERIOVENOUS (AV) FISTULA CREATION;  Surgeon: Serafina Mitchell, MD;  Location: [place];  Service: Vascular;  Laterality: Right;   AV FISTULA PLACEMENT Right 09/15/2015   Procedure: ARTERIOVENOUS (AV) FISTULA CREATION- RIGHT ARM;  Surgeon: Serafina Mitchell, MD;  Location: [place];  Service: Vascular;  Laterality: Right;   CAPD INSERTION N/A 05/18/2014   Procedure: LAPAROSCOPIC INSERTION  CONTINUOUS AMBULATORY PERITONEAL DIALYSIS  (CAPD) CATHETER;  Surgeon: Ralene Ok, MD;  Location: [place];  Service: General;  Laterality: N/A;   COLONOSCOPY     COLONOSCOPY WITH PROPOFOL N/A 07/12/2016   Procedure: COLONOSCOPY WITH PROPOFOL;  Surgeon: [name], MD;  Location: [facility];  Service: Endoscopy;  Laterality: N/A;   CORONARY ARTERY BYPASS GRAFT N/A 10/05/2021   Procedure: CORONARY ARTERY BYPASS GRAFTING (CABG) X 1, ON PUMP, USING LEFT INTERNAL MAMMARY ARTERY CONDUIT;  Surgeon: [name], MD;  Location: [place];  Service: Open Heart Surgery;  Laterality: N/A;   declotting of fistula  08/2015   ESOPHAGOGASTRODUODENOSCOPY (EGD) WITH PROPOFOL N/A 07/12/2016   Procedure: ESOPHAGOGASTRODUODENOSCOPY (EGD) WITH PROPOFOL;  Surgeon: [name], MD;  Location: [facility];  Service: Endoscopy;  Laterality: N/A;   FISTULA SUPERFICIALIZATION Right 02/07/2016   Procedure: RIIGHT UPPER ARM FISTULA SUPERFICIALIZATION;  Surgeon: Elam Dutch, MD;  Location: [place];  Service: Vascular;  Laterality: Right;   IJ catheter insertion  INSERTION OF DIALYSIS CATHETER Right 02/25/2015   Procedure: INSERTION OF RIGHT INTERNAL JUGULAR DIALYSIS CATHETER;  Surgeon: Serafina Mitchell, MD;  Location: [place];  Service: Vascular;  Laterality: Right;   KIDNEY TRANSPLANT     LAPAROSCOPIC REPOSITIONING CAPD CATHETER N/A 06/16/2014   Procedure: LAPAROSCOPIC REPOSITIONING CAPD CATHETER;  Surgeon: Ralene Ok, MD;  Location: [place];  Service: General;  Laterality: N/A;   LAPAROSCOPY N/A 05/04/2015   Procedure: LAPAROSCOPY DIAGNOSTIC LYSIS OF ADHESIONS;  Surgeon: Coralie Keens, MD;  Location: [place];  Service: General;  Laterality: N/A;   MINOR REMOVAL OF PERITONEAL DIALYSIS CATHETER N/A 04/28/2015   Procedure:  REMOVAL OF PERITONEAL DIALYSIS CATHETER;  Surgeon: Coralie Keens, MD;  Location: [place];  Service: General;  Laterality: N/A;   NEPHRECTOMY Left 1974   RENAL BIOPSY Right 2012   REVISON OF  ARTERIOVENOUS FISTULA Right 05/26/2015   Procedure: SUPERFICIALIZATION OF ARTERIOVENOUS FISTULA WITH SIDE BRANCH LIGATIONS;  Surgeon: Serafina Mitchell, MD;  Location: [facility];  Service: Vascular;  Laterality: Right;   RIGHT/LEFT HEART CATH AND CORONARY ANGIOGRAPHY N/A 08/22/2021   Procedure: RIGHT/LEFT HEART CATH AND CORONARY ANGIOGRAPHY;  Surgeon: Troy Sine, MD;  Location: [place] [facility];  Service: Cardiovascular;  Laterality: N/A;   SKIN CANCER EXCISION     right shoulder   SVT ABLATION N/A 11/23/2016   Procedure: SVT Ablation;  Surgeon: Will Meredith Leeds, MD;  Location: [place] [facility];  Service: Cardiovascular;  Laterality: N/A;   TEE WITHOUT CARDIOVERSION N/A 10/05/2021   Procedure: TRANSESOPHAGEAL ECHOCARDIOGRAM (TEE);  Surgeon: [name], MD;  Location: [place];  Service: Open Heart Surgery;  Laterality: N/A;   UMBILICAL HERNIA REPAIR N/A 05/18/2014   Procedure: HERNIA REPAIR UMBILICAL ADULT;  Surgeon: Ralene Ok, MD;  Location: [place];  Service: General;  Laterality: N/A;   UMBILICAL HERNIA REPAIR N/A 04/28/2015   Procedure: UMBILICAL HERNIA REPAIR WITH MESH;  Surgeon: Coralie Keens, MD;  Location: [place];  Service: General;  Laterality: N/A;   Patient Active Problem List   Diagnosis Date Noted   TIA (transient ischemic attack) 05/01/2022   Pyelonephritis 04/30/2022   History of ESBL Klebsiella pneumoniae infection 04/30/2022   DNR (do not resuscitate)/DNI(Do Not Intubate) 04/30/2022   Mild vascular neurocognitive disorder 02/08/2022   Atrial fibrillation 11/29/2021   Acute cystitis without hematuria 11/09/2021   Hyponatremia 11/09/2021   Cerebrovascular accident (CVA) due to embolism of precerebral artery    History of COVID-19 2023   S/P AVR (aortic valve replacement) 10/05/2021   Exertional dyspnea    Combined arterial insufficiency and corporo-venous occlusive erectile dysfunction 12/28/2019   OAB (overactive bladder) 12/28/2019   Inguinal hernia without  obstruction or gangrene 06/30/2017   Tremor of both hands 06/29/2017   Hydrocele, acquired 04/07/2017   Scrotal edema 03/14/2017   Diabetic neuropathy 03/01/2017   Kidney transplanted 02/26/2017   Immunosuppressed status 02/26/2017   Diverticulosis of colon without hemorrhage    Morbid obesity ([place]) 02/03/2016   Type 2 diabetes mellitus with hyperglycemia, with long-term current use of insulin 07/14/2015   Chronic renal failure    Anemia in chronic kidney disease 03/30/2015   Benign essential hypertension 07/30/2014   Stage 3b chronic kidney disease (CKD) 03/22/2014   Fatigue 01/08/2014   History of unilateral nephrectomy 09/06/2013   SVT (supraventricular tachycardia) ([place]) 09/06/2013   Lower extremity edema 09/06/2013   Obstructive sleep apnea 09/06/2013   Hypothyroidism 09/06/2013   Hyperlipidemia 09/06/2013    ONSET DATE: 05/01/22  REFERRING DIAG: R42 (ICD-10-CM) - Dizziness and giddiness  THERAPY DIAG:  Dizziness and giddiness  Unsteadiness on feet  Other abnormalities of gait and mobility  Rationale for Evaluation and Treatment Rehabilitation  SUBJECTIVE:   SUBJECTIVE STATEMENT: Can't sleep.  "Spinning" today, about a 3/10. Pt accompanied by: self  PERTINENT HISTORY: a-fib, HTN (hx HTN urgency), CVA, CAD, diapetic neuropathy, ESRD, HLD, kidney transplant 2018, macular degeneration, mild vascular neurocognitive disorder, SVT, DMII, AV fistula, CABG 5597, embolic CVA subcortical L frontal lobe with [name] residual weakness.   PAIN:  Are you having pain? Yes: NPRS scale: 4/10 Pain location: forehead Pain description: headache Aggravating factors: "being ill as a hornet" Relieving factors: get somewhere silent  PRECAUTIONS: Fall  PATIENT GOALS improve dizziness  OBJECTIVE:    TODAY'S TREATMENT: 06/18/2022 Activity Comments  Attempted Berg Balance test today, did not complete due to pt's lethargy.  Pt stands approx 1:30 prior to needing to sit. Throughout  session, pt holds head in flexion and hold UE support at edge of mat, dizziness unchanged at 3/10  160/90 HR 88   Discussed using walker due to unsteadiness, and pt reports "don't want to talk about it" Denies wanting to use walker  Seated gaze stabilization exercises edge of mat, with PT holding target approx 3 feet away Pt performs very slowly and c/o same/slightly increasing symptoms of dizziness.  Pt needs extra time and cues to hold up to neutral position, as he is tending to hold in significant neck flexion, looking at ground or keeping eyes closed.  Short distance gait near counter, using cane, cues to look at ground about 10 ft ahead of feet To try to lessen significant forward head posture       M-CTSIB  Condition 1: Firm Surface, EO 30 Sec, Mild Sway  Condition 2: Firm Surface, EC 5.47 Sec, Severe Sway  Condition 3: Foam Surface, EO Did not proceed Sec,  did not proceed  Sway  Condition 4: Foam Surface, EC Did not proceed Sec,  did not proceed  Sway   Pt with increased sway in ant/posterior direction with EC on solid surface.  Did not proceed to standing on foam.  PATIENT EDUCATION: Education details: Discussed follow up with MD in regards to lethargy, fatigue, continued nausea, especially in relation to contributing to decreased overall functional mobility.  Educated patient in general activity tolerance and trying to avoid sitting/lying down for majority of the day-to include exercises and to include walking at counter (as given by previous home PT) Person educated: Patient Education method: Explanation Education comprehension: verbalized understanding and Pt very lethargic, dizzy with any activity today.  He questions most of PT's recommendations today.        HOME EXERCISE PROGRAM Last updated: 06/12/22 Access Code: [email or web address] URL: [email or web address] Date: 06/12/2022 Prepared by: [facility]  Exercises - Seated Gaze  Stabilization with Head Rotation  - 1 x daily - 5 x weekly - 2-3 sets - 30 sec hold - Seated Gaze Stabilization with Head Nod  - 1 x daily - 5 x weekly - 2-3 sets - 30 sec hold - Standing Balance in Corner with Eyes Closed  - 1 x daily - 5 x weekly - 2-3 sets - 30 sec hold - Standing Near Stance in Corner  - 1 x daily - 5 x weekly - 2-3 sets - 30 sec hold    Below measures were taken at time of initial evaluation unless otherwise specified:  Vitals: 142/78 mmHg 98%, 71bpm  DIAGNOSTIC FINDINGS:  05/01/22 head MRI: Mild-to-moderate chronic microvascular ischemic disease with a few scattered remote lacunar infarcts involving the left hemispheric cerebral white matter.  05/01/22 head MRA: Irregularity with possible 8 mm outpouching extending inferiorly from the left ICA terminus. While this finding is favored to be artifactual, a possible aneurysm is difficult to exclude  05/01/22 neck MRA: Grossly patent carotid artery systems and vertebral arteries within the neck. No obvious hemodynamically significant stenosis.   COGNITION: Overall cognitive status: Within functional limits for tasks assessed   SENSATION: Reports limited sensation in B hands/feet  POSTURE: rounded shoulders, forward head, and frequently bending forward/readjusting in seat; slightly SOB which patient reports is normal for him  GAIT: Gait pattern:  Gait very slow and cautious with L toe out Assistive device utilized: Single point cane  PATIENT SURVEYS:  FOTO [phone]   VESTIBULAR ASSESSMENT   GENERAL OBSERVATION: patient is wearing progressive lenses which he wears most time      OCULOMOTOR EXAM:   Ocular Alignment: normal; L orbit slightly elevated    Ocular ROM: No Limitations   Spontaneous Nystagmus: absent   Gaze-Induced Nystagmus: absent   Smooth Pursuits: saccades and superiorly 1-2    Saccades: hypometric/undershoots and slow more evident vertically than horizontally    Convergence/Divergence:  reports diplopia at any distance     VESTIBULAR - OCULAR REFLEX:    Slow VOR: Comment: very slow and guarded horizontal; slightly better vertical   VOR Cancellation: Normal; c/o slight increase in dizziness    Head-Impulse Test: HIT Right: positive HIT Left: covert positive        POSITIONAL TESTING:  Right Roll Test: negative Left Roll Test: c/o increased dizziness; Duration: 10 sec Right Sidelying: negative except for c/o woozing upon sitting up Left Sidelying: negative except for c/o woozing upon sitting up    VESTIBULAR TREATMENT:  PATIENT EDUCATION: Education details: prognosis, POC, HEP; educated on using RW rather than SPC d/t frequency of falls with hx of head trauma  Person educated: Patient Education method: Explanation, Demonstration, Tactile cues, Verbal cues, and Handouts Education comprehension: verbalized understanding and returned demonstration   GOALS: Goals reviewed with patient? Yes  SHORT TERM GOALS: Target date: 06/06/2022  Patient to be independent with initial HEP. Baseline: HEP initiated Goal status:PARTIALLY MET-needs cues, but is performing at home 06/14/2022    LONG TERM GOALS: Target date: 07/11/2022  Patient to be independent with advanced HEP. Baseline: Not yet initiated  Goal status: IN PROGRESS  Patient to report 0/10 dizziness with standing vertical and horizontal VOR for 30 seconds. Baseline: Unable Goal status: IN PROGRESS  Patient will report 0/10 dizziness with bed mobility.  Baseline: Symptomatic  Goal status: IN PROGRESS  Patient to demonstrate mild-moderate sway with M-CTSIB condition with eyes closed/firm surface in order to improve safety in environments with dim lighting. Baseline: NT Goal status: IN PROGRESS  Patient to score at least 43/56 on Berg in order to decrease risk of falls. Baseline: NT Goal status: IN PROGRESS    ASSESSMENT:  CLINICAL IMPRESSION: Pt arrives approximately 45 min early for session and  appears that he was napping in lobby, reporting he has not been sleeping at night, will sleep for almost 24 hours.  For majority of PT session today, pt hangs head in significant forward neck/head flexion either looking at floor or keeping eyes closed.  He is responsive and awake during session, but he maintains his baseline 3-4/10 dizziness level throughout and is  reluctant to utilize visual target more at eye level in sitting or standing/with short gait.  He is very unsteady in and out of session and unable to complete Berg and MCTSIB testing today due to fatigue, lethargy, increased LOB and trunk sway.  Encouraged pt to reach out to physician if fatigue, lethargy are negatively impacting pt's ability to perform HEP and functional mobility tasks throughout the day.  Will continue to progress as pt tolerates, towards goals.   OBJECTIVE IMPAIRMENTS Abnormal gait, decreased balance, decreased endurance, decreased mobility, difficulty walking, dizziness, postural dysfunction, and pain.   ACTIVITY LIMITATIONS carrying, lifting, bending, sitting, standing, squatting, sleeping, stairs, transfers, bed mobility, bathing, toileting, dressing, reach over head, hygiene/grooming, and locomotion level  PARTICIPATION LIMITATIONS: meal prep, cleaning, laundry, driving, shopping, community activity, and church  PERSONAL FACTORS Age, Fitness, Past/current experiences, Time since onset of injury/illness/exacerbation, and 3+ comorbidities: a-fib, HTN (hx HTN urgency), CVA, CAD, diapetic neuropathy, ESRD, HLD, kidney transplant 2018, macular degeneration, mild vascular neurocognitive disorder, SVT, DMII, AV fistula, CABG 1884, embolic CVA subcortical L frontal lobe with [name] residual weakness.  are also affecting patient's functional outcome.   REHAB POTENTIAL: Good  CLINICAL DECISION MAKING: Evolving/moderate complexity  EVALUATION COMPLEXITY: Moderate   PLAN: PT FREQUENCY: 2x/week  PT DURATION: 8 weeks  PLANNED  INTERVENTIONS: Therapeutic exercises, Therapeutic activity, Neuromuscular re-education, Balance training, Gait training, Patient/Family education, Self Care, Joint mobilization, Stair training, Vestibular training, Canalith repositioning, Dry Needling, Electrical stimulation, Cryotherapy, Moist heat, Manual therapy, and Re-evaluation  PLAN FOR NEXT SESSION: Turns, assess Berg, MCTSIB, progress VOR and habituation, compliant surfaces, ?convergence exercises?, general activity progression for habituation of dizziness symptoms   Mady Haagensen, PT 06/19/22 7:34 AM Phone: [phone] Fax: [phone]   [place] [facility] [address] [place], [place] [address] Phone # [phone] Fax # [phone]

## 2022-06-19 ENCOUNTER — Encounter: Payer: 59 | Admitting: Physical Therapy

## 2022-06-19 ENCOUNTER — Ambulatory Visit (INDEPENDENT_AMBULATORY_CARE_PROVIDER_SITE_OTHER): Payer: 59 | Admitting: Podiatry

## 2022-06-19 ENCOUNTER — Encounter: Payer: Self-pay | Admitting: Podiatry

## 2022-06-19 DIAGNOSIS — E669 Obesity, unspecified: Secondary | ICD-10-CM

## 2022-06-19 DIAGNOSIS — M79675 Pain in left toe(s): Secondary | ICD-10-CM | POA: Diagnosis not present

## 2022-06-19 DIAGNOSIS — B351 Tinea unguium: Secondary | ICD-10-CM

## 2022-06-19 DIAGNOSIS — E1169 Type 2 diabetes mellitus with other specified complication: Secondary | ICD-10-CM

## 2022-06-19 DIAGNOSIS — E1149 Type 2 diabetes mellitus with other diabetic neurological complication: Secondary | ICD-10-CM | POA: Diagnosis not present

## 2022-06-19 DIAGNOSIS — M79674 Pain in right toe(s): Secondary | ICD-10-CM | POA: Diagnosis not present

## 2022-06-19 DIAGNOSIS — N189 Chronic kidney disease, unspecified: Secondary | ICD-10-CM

## 2022-06-19 NOTE — Progress Notes (Signed)
This patient returns to my office for at risk foot care.  This patient requires this care by a professional since this patient will be at risk due to having uncontrolled diabetes and kidney transplant.  This patient is unable to cut nails himself since the patient cannot reach his nails.These nails are painful walking and wearing shoes.  Resolved heel fissure.   This patient presents for at risk foot care today.  General Appearance  Alert, conversant and in no acute stress.  Vascular  Dorsalis pedis and posterior tibial  pulses are palpable  bilaterally.  Capillary return is within normal limits  bilaterally. Temperature is within normal limits  bilaterally.  Neurologic  Senn-Weinstein monofilament wire test diminished   bilaterally. Muscle power within normal limits bilaterally.  Nails Thick disfigured discolored nails with subungual debris  from hallux to fifth toes bilaterally. No evidence of bacterial infection or drainage bilaterally.  Orthopedic  No limitations of motion  feet .  No crepitus or effusions noted.  No bony pathology or digital deformities noted. HAV  B/L.  Skin  normotropic skin with no porokeratosis noted bilaterally.  No signs of infections or ulcers noted.     Onychomycosis  Pain in right toes  Pain in left toes   Consent was obtained for treatment procedures.   Mechanical debridement of nails 1-5  bilaterally performed with a nail nipper.  Filed with dremel without incident.     Return office visit  3 months                   Told patient to return for periodic foot care and evaluation due to potential at risk complications.   Arthor Gorter DPM  

## 2022-06-19 NOTE — Therapy (Signed)
OUTPATIENT PHYSICAL THERAPY VESTIBULAR TREATMENT     Patient Name: Robert [name] MRN: [id number] DOB:[birth year], 61 y.o., male Today's Date: 06/20/2022  PCP: Haywood Pao, MD REFERRING PROVIDER: Geoffery Lyons, NP   PT End of Session - 06/20/22 1357     Visit Number 5    Number of Visits 17    Date for PT Re-Evaluation 07/11/22    Authorization Type UHC    PT Start Time 1315    PT Stop Time 4680    PT Time Calculation (min) 40 min    Activity Tolerance Patient limited by lethargy    Behavior During Therapy [facility] for tasks assessed/performed                 Past Medical History:  Diagnosis Date   Acute cystitis without hematuria 11/09/2021   AKI (acute kidney injury) 08/08/2017   Elevated sCr to 2.5 (baseline ~1.7) noted at OSH - Subsequent transfer to [facility]  - Currently on IVMF in the setting of diarrhea and AKI - sCr at baseline at time of discharge   Anemia in chronic kidney disease 03/30/2015   Anticoagulated on Coumadin 11/09/2021   Aortic valve stenosis    Arthritis    Atrial fibrillation 11/29/2021   Benign essential hypertension 07/30/2014   prior to transplant pt took Metorpolol tartrate 50mg  qd on off HD days   Benign neoplasm of transverse colon    Cancer    skin ca right shoulder, plastic dsyplasia, pre-Ca polpys removed on Colonoscopy- 07/2014   Cerebrovascular accident (CVA) due to embolism of precerebral artery    Chronic renal failure    Colon polyps 07/23/2014   Tubular adenomas x 5   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   Diverticulosis of colon without hemorrhage    Eczema    ESRD (end stage renal disease)    hemodialysis 06/2014-02/2017, s/p living unrelated kidney transplant 02/26/17   Exertional dyspnea     Fatigue 01/08/2014   Fissure of skin 03/08/2020   GI bleed 07/26/2017   Headache    Heart murmur    aortic stenosis (moderate-severe 04/2021)   History of COVID-19 2023   History of unilateral nephrectomy 09/06/2013   Hydrocele, acquired 04/07/2017   Hyperlipidemia 09/06/2013   Hypertensive urgency 10/21/2021   Hyponatremia 11/09/2021   Hypothyroidism    Immunosuppressed status 02/26/2017   Induction agent: Solumedrol - Envarsus 3mg  daily  - Home dose decreased from 5mg  to 3mg  in the setting of FK 15.7 while inpatient   - Previously on Prograf, though d/c'ed during previous admission; suspected this may be causing hallucinations  - Cellcept 1000mg  Q12H  - Prednisone 5mg  daily   Inguinal hernia without obstruction or gangrene 06/30/2017   Right inguinal wall defect appreciated by CT imaging [place] and on exam Dr. Paulla Fore today Dr. Paulla Fore has discussed hernia repair with mesh at some point in near future  Last Assessment & Plan:  Formatting of this note might be different from the original. Right inguinal wall defect appreciated by CT imaging [place] and on exam Dr. Paulla Fore today Dr. Paulla Fore has discussed hern   Kidney transplanted 02/26/2017   Date of Transplant = 02/26/17  ABO (Donor/Recipient) = compatible Blood type: O+/A+ DonorType: Living donor, unrelated Allograft type:Left  kidney Donor anatomy: 2 arteries, 1 veins and single ureter  Pants-to-skirt anastomosis of dual renal arteries on the backtable. Donor Kidney Bx: Not applicable Allograft injury/complications: None Ne   Lower extremity edema 09/06/2013   Macular degeneration    Mild vascular neurocognitive disorder 02/08/2022   OAB (overactive bladder) 12/28/2019   Obstructive sleep apnea 09/06/2013   On Bi-PAP  Last Assessment & Plan:  Formatting of this note might be different from the original. - History of OSA, continue CPAP at night   Peritoneal dialysis catheter in place 02/24/2015   RLQ abdominal pain 04/19/2020   S/P AVR  (aortic valve replacement) 10/05/2021   SBO (small bowel obstruction) 05/2015   Scrotal edema 03/14/2017   Testicular ultrasound did show hydrocele  Will refer to urology regarding ongoign swelling, hydrocele.   Last Assessment & Plan:  Formatting of this note might be different from the original.   Testicular ultrasound did show hydrocele  Will refer to urology regarding ongoign swelling, hydrocele.   Shortness of breath    with exertion   Stage 3b chronic kidney disease (CKD) 03/22/2014   SVT (supraventricular tachycardia) 09/06/2013   Tremor of both hands 06/29/2017   Last Assessment & Plan:  Present since transplant, stable and unchanged Query from tacrolimus -Improved tremors noted and verbalized by pt - Prograf doses titrated per FK levels  Last Assessment & Plan:  Formatting of this note might be different from the original. Present since transplant, stable and unchanged Query from tacrolimus -Improved tremors noted and verbalized by pt - Prograf doses titr   Type 2 diabetes mellitus with hyperglycemia, with long-term current use of insulin 16/07/9603   Umbilical hernia    s/p repair 04/28/15   Past Surgical History:  Procedure Laterality Date   AORTIC VALVE REPLACEMENT N/A 10/05/2021   Procedure: AORTIC VALVE REPLACEMENT (AVR) WITH INSPIRIS RESILIA AORTIC VALVE SIZE 27MM;  Surgeon: [name], MD;  Location: [place];  Service: Open Heart Surgery;  Laterality: N/A;   AV FISTULA PLACEMENT Right 02/25/2015   Procedure: RIGHT ARTERIOVENOUS (AV) FISTULA CREATION;  Surgeon: Serafina Mitchell, MD;  Location: [place];  Service: Vascular;  Laterality: Right;   AV FISTULA PLACEMENT Right 09/15/2015   Procedure: ARTERIOVENOUS (AV) FISTULA CREATION- RIGHT ARM;  Surgeon: Serafina Mitchell, MD;  Location: [place];  Service: Vascular;  Laterality: Right;   CAPD INSERTION N/A 05/18/2014   Procedure: LAPAROSCOPIC INSERTION CONTINUOUS AMBULATORY PERITONEAL DIALYSIS  (CAPD) CATHETER;  Surgeon: Ralene Ok,  MD;  Location: [place];  Service: General;  Laterality: N/A;   COLONOSCOPY     COLONOSCOPY WITH PROPOFOL N/A 07/12/2016   Procedure: COLONOSCOPY WITH PROPOFOL;  Surgeon: [name], MD;  Location: [facility];  Service: Endoscopy;  Laterality: N/A;   CORONARY ARTERY BYPASS GRAFT N/A 10/05/2021   Procedure: CORONARY ARTERY BYPASS GRAFTING (CABG) X 1, ON PUMP, USING LEFT INTERNAL MAMMARY ARTERY CONDUIT;  Surgeon: [name], MD;  Location: [place];  Service: Open Heart Surgery;  Laterality: N/A;   declotting of fistula  08/2015   ESOPHAGOGASTRODUODENOSCOPY (EGD) WITH PROPOFOL N/A 07/12/2016   Procedure: ESOPHAGOGASTRODUODENOSCOPY (EGD) WITH PROPOFOL;  Surgeon: [name], MD;  Location: [facility];  Service: Endoscopy;  Laterality: N/A;   FISTULA SUPERFICIALIZATION Right 02/07/2016   Procedure: RIIGHT UPPER ARM FISTULA SUPERFICIALIZATION;  Surgeon: Elam Dutch, MD;  Location: [place];  Service: Vascular;  Laterality: Right;   IJ catheter insertion     INSERTION OF DIALYSIS CATHETER Right 02/25/2015  Procedure: INSERTION OF RIGHT INTERNAL JUGULAR DIALYSIS CATHETER;  Surgeon: Serafina Mitchell, MD;  Location: [place];  Service: Vascular;  Laterality: Right;   KIDNEY TRANSPLANT     LAPAROSCOPIC REPOSITIONING CAPD CATHETER N/A 06/16/2014   Procedure: LAPAROSCOPIC REPOSITIONING CAPD CATHETER;  Surgeon: Ralene Ok, MD;  Location: [place];  Service: General;  Laterality: N/A;   LAPAROSCOPY N/A 05/04/2015   Procedure: LAPAROSCOPY DIAGNOSTIC LYSIS OF ADHESIONS;  Surgeon: Coralie Keens, MD;  Location: [place];  Service: General;  Laterality: N/A;   MINOR REMOVAL OF PERITONEAL DIALYSIS CATHETER N/A 04/28/2015   Procedure:  REMOVAL OF PERITONEAL DIALYSIS CATHETER;  Surgeon: Coralie Keens, MD;  Location: [place];  Service: General;  Laterality: N/A;   NEPHRECTOMY Left 1974   RENAL BIOPSY Right 2012   REVISON OF ARTERIOVENOUS FISTULA Right 05/26/2015   Procedure: SUPERFICIALIZATION OF ARTERIOVENOUS  FISTULA WITH SIDE BRANCH LIGATIONS;  Surgeon: Serafina Mitchell, MD;  Location: [place];  Service: Vascular;  Laterality: Right;   RIGHT/LEFT HEART CATH AND CORONARY ANGIOGRAPHY N/A 08/22/2021   Procedure: RIGHT/LEFT HEART CATH AND CORONARY ANGIOGRAPHY;  Surgeon: Troy Sine, MD;  Location: [place] [facility];  Service: Cardiovascular;  Laterality: N/A;   SKIN CANCER EXCISION     right shoulder   SVT ABLATION N/A 11/23/2016   Procedure: SVT Ablation;  Surgeon: Will Meredith Leeds, MD;  Location: [place] [facility];  Service: Cardiovascular;  Laterality: N/A;   TEE WITHOUT CARDIOVERSION N/A 10/05/2021   Procedure: TRANSESOPHAGEAL ECHOCARDIOGRAM (TEE);  Surgeon: [name], MD;  Location: [place];  Service: Open Heart Surgery;  Laterality: N/A;   UMBILICAL HERNIA REPAIR N/A 05/18/2014   Procedure: HERNIA REPAIR UMBILICAL ADULT;  Surgeon: Ralene Ok, MD;  Location: [place];  Service: General;  Laterality: N/A;   UMBILICAL HERNIA REPAIR N/A 04/28/2015   Procedure: UMBILICAL HERNIA REPAIR WITH MESH;  Surgeon: Coralie Keens, MD;  Location: [place];  Service: General;  Laterality: N/A;   Patient Active Problem List   Diagnosis Date Noted   TIA (transient ischemic attack) 05/01/2022   Pyelonephritis 04/30/2022   History of ESBL Klebsiella pneumoniae infection 04/30/2022   DNR (do not resuscitate)/DNI(Do Not Intubate) 04/30/2022   Mild vascular neurocognitive disorder 02/08/2022   Atrial fibrillation 11/29/2021   Acute cystitis without hematuria 11/09/2021   Hyponatremia 11/09/2021   Cerebrovascular accident (CVA) due to embolism of precerebral artery    History of COVID-19 2023   S/P AVR (aortic valve replacement) 10/05/2021   Exertional dyspnea    Combined arterial insufficiency and corporo-venous occlusive erectile dysfunction 12/28/2019   OAB (overactive bladder) 12/28/2019   Inguinal hernia without obstruction or gangrene 06/30/2017   Tremor of both hands 06/29/2017   Hydrocele,  acquired 04/07/2017   Scrotal edema 03/14/2017   Diabetic neuropathy 03/01/2017   Kidney transplanted 02/26/2017   Immunosuppressed status 02/26/2017   Diverticulosis of colon without hemorrhage    Morbid obesity ([place]) 02/03/2016   Type 2 diabetes mellitus with hyperglycemia, with long-term current use of insulin 07/14/2015   Chronic renal failure    Anemia in chronic kidney disease 03/30/2015   Benign essential hypertension 07/30/2014   Stage 3b chronic kidney disease (CKD) 03/22/2014   Fatigue 01/08/2014   History of unilateral nephrectomy 09/06/2013   SVT (supraventricular tachycardia) ([place]) 09/06/2013   Lower extremity edema 09/06/2013   Obstructive sleep apnea 09/06/2013   Hypothyroidism 09/06/2013   Hyperlipidemia 09/06/2013    ONSET DATE: 05/01/22  REFERRING DIAG: R42 (ICD-10-CM) - Dizziness and giddiness  THERAPY DIAG:  Dizziness and giddiness  Unsteadiness on feet  Other abnormalities of gait and mobility  Rationale for Evaluation and Treatment Rehabilitation  SUBJECTIVE:   SUBJECTIVE STATEMENT: Had a root canal today and is in a bit of pain. The Nortriptyline is making me "loopid stupid." Saw an ENT last Tuesday and was given a shot of Celestone- denies changes in blood sugar as a result. Reports that he is not sure why this was given to him as he was told that he does not have vertigo.  Pt accompanied by: self  PERTINENT HISTORY: a-fib, HTN (hx HTN urgency), CVA, CAD, diapetic neuropathy, ESRD, HLD, kidney transplant 2018, macular degeneration, mild vascular neurocognitive disorder, SVT, DMII, AV fistula, CABG 8832, embolic CVA subcortical L frontal lobe with [name] residual weakness.   PAIN:  Are you having pain? Yes: NPRS scale: 4/10 Pain location: mouth Pain description: ache Aggravating factors: root canal Relieving factors: nothing  PRECAUTIONS: Fall  PATIENT GOALS improve dizziness  OBJECTIVE:     TODAY'S TREATMENT: 06/20/22 Activity Comments   Vitals at start of session 148/76 mmHg, 93 bpm, 96% spO2  STS + gaze on target 5x, 5x with EC and chair in front Cues to reach back for seat  sitting VOR horizontal 2x30"   Good gaze stability but small amplitude; able to increase speed and amplitude when prompted with 2/10 dizziness   standing VOR horizontal 30"   2/10 dizziness   sitting VOR vertical 30"  3/10 dizziness   standing VOR vertical 30"  2/10 dizziness  narrow stance balance 30", with EC 30" Mild- mod sway     [facility] PT Assessment - 06/20/22 0001       Berg Balance Test   Sit to Stand Able to stand without using hands and stabilize independently    Standing Unsupported Able to stand safely 2 minutes    Sitting with Back Unsupported but Feet Supported on Floor or Stool Able to sit 2 minutes under supervision    Stand to Sit Sits safely with minimal use of hands    Transfers Able to transfer safely, minor use of hands    Standing Unsupported with Eyes Closed Able to stand 10 seconds with supervision    Standing Unsupported with Feet Together Able to place feet together independently and stand for 1 minute with supervision    From Standing, Reach Forward with Outstretched Arm Can reach forward >12 cm safely (5")    From Standing Position, Pick up Object from Floor Able to pick up shoe, needs supervision    From Standing Position, Turn to Look Behind Over each Shoulder Turn sideways only but maintains balance    Turn 360 Degrees Able to turn 360 degrees safely but slowly    Standing Unsupported, Alternately Place Feet on Step/Stool Able to stand independently and safely and complete 8 steps in 20 seconds    Standing Unsupported, One Foot in Front Able to take small step independently and hold 30 seconds    Standing on One Leg Tries to lift leg/unable to hold 3 seconds but remains standing independently    Total Score 42               HOME EXERCISE PROGRAM Last updated: 06/20/22 Access Code: [email or web address] URL:  [email or web address] Date: 06/20/2022 Prepared by: [facility]  Program Notes perform in corner for safety  Exercises - Standing Near Stance in Corner  - 1 x daily - 5 x weekly -  2-3 sets - 30 sec hold - Romberg Stance with Eyes Closed  - 1 x daily - 5 x weekly - 2-3 sets - 30 sec hold - Standing Tandem Balance with Counter Support  - 1 x daily - 5 x weekly - 2 sets - 30 sec hold - Standing Toe Taps  - 1 x daily - 5 x weekly - 2 sets - 10 reps - Standing Gaze Stabilization with Head Rotation  - 1 x daily - 5 x weekly - 2-3 sets - 30 sec hold - Standing Gaze Stabilization with Head Nod  - 1 x daily - 5 x weekly - 2-3 sets - 30 sec hold    PATIENT EDUCATION: Education details: HEP update, exam findings Person educated: Patient Education method: Explanation, Demonstration, Tactile cues, Verbal cues, and Handouts Education comprehension: verbalized understanding and returned demonstration    Below measures were taken at time of initial evaluation unless otherwise specified:   Vitals: 142/78 mmHg 98%, 71bpm  DIAGNOSTIC FINDINGS:  05/01/22 head MRI: Mild-to-moderate chronic microvascular ischemic disease with a few scattered remote lacunar infarcts involving the left hemispheric cerebral white matter.  05/01/22 head MRA: Irregularity with possible 8 mm outpouching extending inferiorly from the left ICA terminus. While this finding is favored to be artifactual, a possible aneurysm is difficult to exclude  05/01/22 neck MRA: Grossly patent carotid artery systems and vertebral arteries within the neck. No obvious hemodynamically significant stenosis.   COGNITION: Overall cognitive status: Within functional limits for tasks assessed   SENSATION: Reports limited sensation in B hands/feet  POSTURE: rounded shoulders, forward head, and frequently bending forward/readjusting in seat; slightly SOB which patient reports is normal for  him  GAIT: Gait pattern:  Gait very slow and cautious with L toe out Assistive device utilized: Single point cane  PATIENT SURVEYS:  FOTO [phone]   VESTIBULAR ASSESSMENT   GENERAL OBSERVATION: patient is wearing progressive lenses which he wears most time      OCULOMOTOR EXAM:   Ocular Alignment: normal; L orbit slightly elevated    Ocular ROM: No Limitations   Spontaneous Nystagmus: absent   Gaze-Induced Nystagmus: absent   Smooth Pursuits: saccades and superiorly 1-2    Saccades: hypometric/undershoots and slow more evident vertically than horizontally    Convergence/Divergence: reports diplopia at any distance     VESTIBULAR - OCULAR REFLEX:    Slow VOR: Comment: very slow and guarded horizontal; slightly better vertical   VOR Cancellation: Normal; c/o slight increase in dizziness    Head-Impulse Test: HIT Right: positive HIT Left: covert positive        POSITIONAL TESTING:  Right Roll Test: negative Left Roll Test: c/o increased dizziness; Duration: 10 sec Right Sidelying: negative except for c/o woozing upon sitting up Left Sidelying: negative except for c/o woozing upon sitting up    VESTIBULAR TREATMENT:  PATIENT EDUCATION: Education details: prognosis, POC, HEP; educated on using RW rather than SPC d/t frequency of falls with hx of head trauma  Person educated: Patient Education method: Explanation, Demonstration, Tactile cues, Verbal cues, and Handouts Education comprehension: verbalized understanding and returned demonstration   GOALS: Goals reviewed with patient? Yes  SHORT TERM GOALS: Target date: 06/06/2022  Patient to be independent with initial HEP. Baseline: HEP initiated Goal status:PARTIALLY MET-needs cues, but is performing at home 06/14/2022    LONG TERM GOALS: Target date: 07/11/2022  Patient to be independent with advanced HEP. Baseline: Not yet initiated  Goal status: IN PROGRESS  Patient to report 0/10  dizziness with standing  vertical and horizontal VOR for 30 seconds. Baseline: Unable Goal status: IN PROGRESS  Patient will report 0/10 dizziness with bed mobility.  Baseline: Symptomatic  Goal status: IN PROGRESS  Patient to demonstrate mild-moderate sway with M-CTSIB condition with eyes closed/firm surface in order to improve safety in environments with dim lighting. Baseline: NT Goal status: IN PROGRESS  Patient to score at least 43/56 on Berg in order to decrease risk of falls. Baseline: NT Goal status: IN PROGRESS    ASSESSMENT:  CLINICAL IMPRESSION: Patient arrived to session ambulating at slow speed and noting mouth pain for a root canal earlier today. Patient reports that he was given a shot of Celestone at the ENT last week. Offered to reach out to Dr. Tomi Likens about patient's continued c/o side effects from Nortriptyline however patient declined. Worked on low reps of mobility exercises with gaze stabilization with good tolerance. VOR activities in sitting revealed improved tolerance, thus able to increase pace and transition to standing. Patient scored 42/56 on Berg, indicating increased risk of falls. Most difficulty evident with narrow BOS. Patient reported understanding of all edu provided and without complaints at end of session.    OBJECTIVE IMPAIRMENTS Abnormal gait, decreased balance, decreased endurance, decreased mobility, difficulty walking, dizziness, postural dysfunction, and pain.   ACTIVITY LIMITATIONS carrying, lifting, bending, sitting, standing, squatting, sleeping, stairs, transfers, bed mobility, bathing, toileting, dressing, reach over head, hygiene/grooming, and locomotion level  PARTICIPATION LIMITATIONS: meal prep, cleaning, laundry, driving, shopping, community activity, and church  PERSONAL FACTORS Age, Fitness, Past/current experiences, Time since onset of injury/illness/exacerbation, and 3+ comorbidities: a-fib, HTN (hx HTN urgency), CVA, CAD, diapetic neuropathy, ESRD, HLD,  kidney transplant 2018, macular degeneration, mild vascular neurocognitive disorder, SVT, DMII, AV fistula, CABG 4628, embolic CVA subcortical L frontal lobe with [name] residual weakness.  are also affecting patient's functional outcome.   REHAB POTENTIAL: Good  CLINICAL DECISION MAKING: Evolving/moderate complexity  EVALUATION COMPLEXITY: Moderate   PLAN: PT FREQUENCY: 2x/week  PT DURATION: 8 weeks  PLANNED INTERVENTIONS: Therapeutic exercises, Therapeutic activity, Neuromuscular re-education, Balance training, Gait training, Patient/Family education, Self Care, Joint mobilization, Stair training, Vestibular training, Canalith repositioning, Dry Needling, Electrical stimulation, Cryotherapy, Moist heat, Manual therapy, and Re-evaluation  PLAN FOR NEXT SESSION: Turns, MCTSIB, progress VOR and habituation, compliant surfaces, ?convergence exercises?, general activity progression for habituation of dizziness symptoms   Janene Harvey, PT, DPT 06/20/22 1:59 PM  [facility] at [facility] [address] [place], [place] [address] Phone # [phone] Fax # [phone]

## 2022-06-20 ENCOUNTER — Encounter: Payer: Self-pay | Admitting: Physical Therapy

## 2022-06-20 ENCOUNTER — Ambulatory Visit: Payer: 59 | Admitting: Physical Therapy

## 2022-06-20 DIAGNOSIS — R2689 Other abnormalities of gait and mobility: Secondary | ICD-10-CM

## 2022-06-20 DIAGNOSIS — R42 Dizziness and giddiness: Secondary | ICD-10-CM | POA: Diagnosis not present

## 2022-06-20 DIAGNOSIS — R2681 Unsteadiness on feet: Secondary | ICD-10-CM

## 2022-06-21 ENCOUNTER — Ambulatory Visit: Payer: 59 | Admitting: Physician Assistant

## 2022-06-21 ENCOUNTER — Encounter: Payer: Self-pay | Admitting: Neurology

## 2022-06-21 ENCOUNTER — Telehealth: Payer: Self-pay

## 2022-06-21 ENCOUNTER — Encounter: Payer: 59 | Admitting: Physical Therapy

## 2022-06-21 ENCOUNTER — Encounter: Payer: Self-pay | Admitting: Physician Assistant

## 2022-06-21 VITALS — BP 140/78 | HR 71 | Wt 287.5 lb

## 2022-06-21 DIAGNOSIS — Z86010 Personal history of colonic polyps: Secondary | ICD-10-CM | POA: Diagnosis not present

## 2022-06-21 DIAGNOSIS — Z7901 Long term (current) use of anticoagulants: Secondary | ICD-10-CM

## 2022-06-21 NOTE — Telephone Encounter (Signed)
Will route to pharm for anticoag input Last OV 05/2022 reviewed - Denyse Amass outlined recent w/u for dizziness, CP but felt to be clinically stable at that visit - may benefit from non VV call to ensure no new changes

## 2022-06-21 NOTE — Progress Notes (Signed)
Subjective:    Patient ID: Robert [name], male    DOB: 25-Dec-1960, 61 y.o.   MRN: [id number]  HPI  Robert [name] is a 61 year old white male, established with Dr. Ardis Hughs who comes in today to discuss recall colonoscopy. He last had colonoscopy and EGD in 2017.  Endoscopy was unremarkable.  At colonoscopy done for follow-up of adenomatous colon polyps he was found to have one 4 mm polyp in the transverse colon which was a tubular adenoma and multiple diverticuli in the left colon.  He was indicated for 5-year interval follow-up. Patient has multiple comorbidities including hypertension, prior history of CVA post CABG in 2022 at which time he also underwent aortic valve replacement with tissue valve.  He has history of atrial fibrillation for which she is on Eliquis, sleep apnea with CPAP use no oxygen, obesity with BMI of 40, chronic kidney disease stage III.  He is status post prior kidney transplant.  He has adult onset diabetes mellitus, insulin-dependent. Most recent echo July 2023 with EF of 60 to 65%-status post aortic valve replacement/Edward's inspiris.  Patient says he has not felt well over the past year, he has had right sided weakness post CABG in 2022.  Had been diagnosed with left greater than right bilateral embolic infarcts post CABG and AVR.  He is followed by Dr. Jaffe/neurology.  He has also had problems with chronic daily headaches, now having headaches primarily brought on by attempts to read.  He is also been having issues with dizziness per neurology notes felt secondary to bilateral vestibular hypofunction. He had an admission in July 2023 with headache and dizziness, some facial numbness.  Work-up with CT and MRI did not reveal any acute infarct-and symptoms felt secondary to vestibular dysfunction..  Patient says he is currently out on short-term disability, wants to get colonoscopy done while he is out of work.  He says his chronic issues with dizziness headache etc. are not  expected to clear in the near future. He has no current GI complaints, specifically no complaints of constipation or diarrhea, changes in bowel habits, no melena or hematochezia.  Review of Systems Pertinent positive and negative review of systems were noted in the above HPI section.  All other review of systems was otherwise negative.   Outpatient Encounter Medications as of 06/21/2022  Medication Sig   acetaminophen (TYLENOL) 325 MG tablet Take 2 tablets (650 mg total) by mouth every 4 (four) hours as needed for moderate pain.   amLODipine (NORVASC) 5 MG tablet Take 1.5 tablets (7.5 mg total) by mouth daily.   apixaban (ELIQUIS) 5 MG TABS tablet Take 1 tablet (5 mg total) by mouth 2 (two) times daily. Stop coumadin, monitor the PT/INR, and start Eliquis when the INR is <2.   B-D UF III MINI PEN NEEDLES 31G X 5 MM MISC USE TO INJECT INSULIN 4 TIMES A DAY   belatacept (NULOJIX) 250 MG SOLR injection Inject 24.5 mLs into the vein every 28 (twenty-eight) days.    carvedilol (COREG) 25 MG tablet Take 1 tablet (25 mg total) by mouth 2 (two) times daily with a meal.   chlorthalidone (HYGROTON) 25 MG tablet Take 1 tablet by mouth daily.   Continuous Blood Gluc Sensor (FREESTYLE LIBRE 3 SENSOR) MISC SMARTSIG:1 Topical Every 2 Weeks   doxazosin (CARDURA) 2 MG tablet Take 0.5 tablets (1 mg total) by mouth daily. Take 0.5 Tablet Daily   gabapentin (NEURONTIN) 100 MG capsule Take 200-300 mg by mouth See admin instructions.  Taking 200 mg in the AM and 300 mg in the evening   hydrALAZINE (APRESOLINE) 100 MG tablet TAKE 1 TABLET BY MOUTH EVERY 8 HOURS (Patient taking differently: Take 100 mg by mouth 2 (two) times daily. May take a noon dose if Systolic is > 419)   insulin aspart (NOVOLOG) 100 UNIT/ML injection Inject 5-20 Units into the skin daily as needed for high blood sugar.   insulin regular human CONCENTRATED (HUMULIN R) 500 UNIT/ML injection Inject 125 Units into the skin every 3 (three) days. Omnipod    levothyroxine (SYNTHROID) 112 MCG tablet Take 1 tablet (112 mcg total) by mouth daily.   mycophenolate (CELLCEPT) 250 MG capsule Take 1,000 mg by mouth every 12 (twelve) hours.    NEEDLE, DISP, 18 G (BD HYPODERMIC NEEDLE) 18G X 1" MISC Inject 2 each into the muscle every 7 (seven) days. Use 2 needles once weekly for Testosterone medication. Use 1 needle to draw up medication and 2'nd needle to inject into muscle.   NEEDLE, DISP, 23 G (BD DISP NEEDLE) 23G X 1" MISC USE TO INJECT TESTOSTERONE MEDICATION   nitroGLYCERIN (NITROSTAT) 0.4 MG SL tablet Place 0.4 mg under the tongue every 5 (five) minutes as needed for chest pain.   NON FORMULARY CPAP/BIPAP  at bedtime  BIPAP per pt   nortriptyline (PAMELOR) 10 MG capsule TAKE 1 CAPSULE BY MOUTH AT BEDTIME.   predniSONE (DELTASONE) 5 MG tablet Take 5 mg by mouth daily with breakfast. Continuously   rosuvastatin (CRESTOR) 20 MG tablet Take 1 tablet (20 mg total) by mouth daily.   senna-docusate (SENOKOT-S) 8.6-50 MG tablet Take 1 tablet by mouth at bedtime as needed for mild constipation. (Patient taking differently: Take 1 tablet by mouth every other day.)   Syringe, Disposable, 1 ML MISC Use to draw up 0.51m of testosterone medication   tamsulosin (FLOMAX) 0.4 MG CAPS capsule Take 0.8 mg by mouth daily after supper.   Testosterone Cypionate 200 MG/ML SOLN Inject 120 mg as directed once a week. Saturday   zolpidem (AMBIEN) 10 MG tablet Take 10 mg by mouth at bedtime as needed for sleep.   No facility-administered encounter medications on file as of 06/21/2022.   No Known Allergies Patient Active Problem List   Diagnosis Date Noted   TIA (transient ischemic attack) 05/01/2022   Pyelonephritis 04/30/2022   History of ESBL Klebsiella pneumoniae infection 04/30/2022   DNR (do not resuscitate)/DNI(Do Not Intubate) 04/30/2022   Mild vascular neurocognitive disorder 02/08/2022   Atrial fibrillation 11/29/2021   Acute cystitis without hematuria 11/09/2021    Hyponatremia 11/09/2021   Cerebrovascular accident (CVA) due to embolism of precerebral artery    History of COVID-19 2023   S/P AVR (aortic valve replacement) 10/05/2021   Exertional dyspnea    Combined arterial insufficiency and corporo-venous occlusive erectile dysfunction 12/28/2019   OAB (overactive bladder) 12/28/2019   Inguinal hernia without obstruction or gangrene 06/30/2017   Tremor of both hands 06/29/2017   Hydrocele, acquired 04/07/2017   Scrotal edema 03/14/2017   Diabetic neuropathy 03/01/2017   Kidney transplanted 02/26/2017   Immunosuppressed status 02/26/2017   Diverticulosis of colon without hemorrhage    [place] 02/03/2016   Type 2 diabetes mellitus with hyperglycemia, with long-term current use of insulin 07/14/2015   Chronic renal failure    Anemia in chronic kidney disease 03/30/2015   Benign essential hypertension 07/30/2014   Stage 3b chronic kidney disease (CKD) 03/22/2014   Fatigue 01/08/2014   History of unilateral nephrectomy  09/06/2013   SVT (supraventricular tachycardia) ([place]) 09/06/2013   Lower extremity edema 09/06/2013   Obstructive sleep apnea 09/06/2013   Hypothyroidism 09/06/2013   Hyperlipidemia 09/06/2013   Social History   Socioeconomic History   Marital status: Married    Spouse name: Not on file   Number of children: 3   Years of education: 16   Highest education level: Bachelor's degree (e.g., BA, AB, BS)  Occupational History   Occupation: Training and development officer: [employer]  Tobacco Use   Smoking status: Former    Types: Cigars    Quit date: 12/06/2012    Years since quitting: 9.5   Smokeless tobacco: Never  Vaping Use   Vaping Use: Never used  Substance and Sexual Activity   Alcohol use: Not Currently   Drug use: No   Sexual activity: Not on file  Other Topics Concern   Not on file  Social History Narrative   Not on file   Social Determinants of Health   Financial Resource Strain: Low Risk  (03/21/2022)    Overall Financial Resource Strain (CARDIA)    Difficulty of Paying Living Expenses: Not very hard  Food Insecurity: No Food Insecurity (03/21/2022)   Hunger Vital Sign    Worried About Running Out of Food in the Last Year: Never true    Ran Out of Food in the Last Year: Never true  Transportation Needs: No Transportation Needs (03/21/2022)   PRAPARE - Hydrologist (Medical): No    Lack of Transportation (Non-Medical): No  Physical Activity: Not on file  Stress: Not on file  Social Connections: Not on file  Intimate Partner Violence: Not on file    Mr. Sublette's family history includes Colon polyps in his father; Dementia in his father; Stroke in his father.      Objective:    Vitals:   06/21/22 1100  BP: (!) 140/78  Pulse: 71    Physical Exam Well-developed well-nourished older WM  in no acute distress.  Height, Weight,287  BMI 40.10 ambulates with a cane  HEENT; nontraumatic normocephalic, EOMI, PE R LA, sclera anicteric. Oropharynx; not examined today Neck; supple, no JVD Cardiovascular; regular rate and rhythm with S1-S2, sternal incisional scar Pulmonary; Clear bilaterally, somewhat decreased breath sounds bilaterally Abdomen; obese, soft, nontender, nondistended, no palpable mass or hepatosplenomegaly, bowel sounds are active, Rectal; not done today Skin; benign exam, no jaundice rash or appreciable lesions Extremities; no clubbing cyanosis or edema skin warm and dry Neuro/Psych; alert and oriented x4, grossly nonfocal mood and affect appropriate        Assessment & Plan:   #16 61 year old white male with history of adenomatous colon polyps-due for follow-up colonoscopy.  Last colonoscopy 2017 with one 4 mm polyp removed which was a tubular adenoma and indicated for 5-year interval follow-up.  No current GI symptoms  #2 chronic anticoagulation-on Eliquis #3 atrial fibrillation 4.  History of CVA embolic post CABG #5 status post CABG  and aortic valve replacement 2022/tissue valve #6 chronic kidney disease stage III #7.  Prior kidney transplant #8.  Adult onset diabetes mellitus -insulin-dependent #9.  Sleep apnea with CPAP use no oxygen #10 hypertension #11 dizziness and headaches felt secondary to vestibular dysfunction-in admission July 2023, work-up with CT and MRI with no evidence for acute infarct.  Plan; patient will be scheduled for colonoscopy with Dr. Havery Moros in the [facility].  Procedure was discussed in detail with the patient including indications risk and benefits and  he is agreeable to proceed  Eliquis will need to be held for 48 hours prior to procedure.  We will communicate with his cardiologist/Dr. Kelly/Dr. Genella Rife to assure this is reasonable for this patient. We will also request neuro clearance /Dr. Tomi Likens -however after review of records and last neuro note it appears that he is not felt to have had a recent TIA or CVA.  Stasia Somero Genia Harold PA-C 06/21/2022   Cc: [name], [name], MD

## 2022-06-21 NOTE — Telephone Encounter (Signed)
Patient with diagnosis of A Fib on Eliquis for anticoagulation.    Procedure: colonoscopy Date of procedure: 07/13/22   CHA2DS2-VASc Score = 5  This indicates a 7.2% annual risk of stroke. The patient's score is based upon: CHF History: 0 HTN History: 1 Diabetes History: 1 Stroke History: 2 Vascular Disease History: 1 Age Score: 0 Gender Score: 0   CrCl 48 mL/min using adjusted body weight Platelet count 213K    Per office protocol, patient can hold Eliquis for 1 day prior to procedure.     **This guidance is not considered finalized until pre-operative APP has relayed final recommendations.**

## 2022-06-21 NOTE — Patient Instructions (Addendum)
If you are age 63 or younger, your body mass index should be between 19-25. Your Body mass index is 40.1 kg/m. If this is out of the aformentioned range listed, please consider follow up with your Primary Care Provider.   __________________________________________________________  The [facility] providers would like to encourage you to use [facility] to communicate with providers for non-urgent requests or questions.  Due to long hold times on the telephone, sending your provider a message by [facility] may be a faster and more efficient way to get a response.  Please allow 48 business hours for a response.  Please remember that this is for non-urgent requests.   Due to recent changes in healthcare laws, you may see the results of your imaging and laboratory studies on MyChart before your provider has had a chance to review them.  We understand that in some cases there may be results that are confusing or concerning to you. Not all laboratory results come back in the same time frame and the provider may be waiting for multiple results in order to interpret others.  Please give [place] 48 hours in order for your provider to thoroughly review all the results before contacting the office for clarification of your results.   You have been scheduled for a colonoscopy. Please follow written instructions given to you at your visit today.  Please pick up your prep supplies at the pharmacy within the next 1-3 days. If you use inhalers (even only as needed), please bring them with you on the day of your procedure.   You will be contaced by our office prior to your procedure for directions on holding your Eliquis.  If you do not hear from our office 1 week prior to your scheduled procedure, please call [phone] to discuss.  Thank you for choosing me and [facility].

## 2022-06-21 NOTE — Telephone Encounter (Signed)
[facility] Pre-operative Risk Assessment     Request for surgical clearance:     Endoscopy Procedure  What type of surgery is being performed?     Colonoscopy  When is this surgery scheduled?     07/13/22  What type of clearance is required ?   Pharmacy  Are there any medications that need to be held prior to surgery and how long? Eliquis for 2 days  Practice name and name of physician performing surgery?  Dr. Havery Moros,    [facility]  What is your office phone and fax number?      Phone- [phone]  [phone]  Anesthesia type (None, local, MAC, general) ?       MAC

## 2022-06-21 NOTE — Progress Notes (Signed)
Agree with assessment with the following thoughts:  Robert [name] in looking through this record, he had 1 pretty small adenoma removed in 2017.  Based on changes in surveillance guidelines in recent years, I actually think okay to extend him to 7 years from his last exam, so he would not be due until October 2024.  Can you let him know I think it is okay to do his exam at that time in 07/2023, he is higher risk coming off Eliquis, I think that is reasonable unless he feels otherwise. Thanks

## 2022-06-22 ENCOUNTER — Telehealth: Payer: Self-pay

## 2022-06-22 NOTE — Telephone Encounter (Signed)
Received fax from Dr. Tomi Likens stating that the patient is cleared for a colonoscopy from a neurologic standpoint.

## 2022-06-22 NOTE — Telephone Encounter (Signed)
Left message for patient to call back to discuss holding Eliquis only 1 day prior to procedure and to ensure no further symptoms of chest pain or dizziness

## 2022-06-25 ENCOUNTER — Encounter: Payer: Self-pay | Admitting: Cardiovascular Disease

## 2022-06-26 ENCOUNTER — Encounter: Payer: 59 | Admitting: Physical Therapy

## 2022-06-26 ENCOUNTER — Ambulatory Visit: Payer: 59

## 2022-06-26 DIAGNOSIS — R2681 Unsteadiness on feet: Secondary | ICD-10-CM

## 2022-06-26 DIAGNOSIS — R42 Dizziness and giddiness: Secondary | ICD-10-CM

## 2022-06-26 DIAGNOSIS — R2689 Other abnormalities of gait and mobility: Secondary | ICD-10-CM

## 2022-06-26 NOTE — Therapy (Signed)
OUTPATIENT PHYSICAL THERAPY VESTIBULAR TREATMENT     Patient Name: Robert [name] MRN: [id number] DOB:September 17, 1961, 61 y.o., male Today's Date: 06/26/2022  PCP: Haywood Pao, MD REFERRING PROVIDER: Geoffery Lyons, NP   PT End of Session - 06/26/22 1313     Visit Number 6    Number of Visits 17    Date for PT Re-Evaluation 07/11/22    Authorization Type U[employer]    PT Start Time 1315    PT Stop Time 1400    PT Time Calculation (min) 45 min    Activity Tolerance Patient limited by lethargy    Behavior During Therapy [facility] for tasks assessed/performed                 Past Medical History:  Diagnosis Date   Acute cystitis without hematuria 11/09/2021   AKI (acute kidney injury) 08/08/2017   Elevated sCr to 2.5 (baseline ~1.7) noted at OSH - Subsequent transfer to [facility]  - Currently on IVMF in the setting of diarrhea and AKI - sCr at baseline at time of discharge   Anemia in chronic kidney disease 03/30/2015   Anticoagulated on Coumadin 11/09/2021   Aortic valve stenosis    Arthritis    Atrial fibrillation 11/29/2021   Benign essential hypertension 07/30/2014   prior to transplant pt took Metorpolol tartrate 20m qd on off HD days   Benign neoplasm of transverse colon    Cancer    skin ca right shoulder, plastic dsyplasia, pre-Ca polpys removed on Colonoscopy- 07/2014   Cerebrovascular accident (CVA) due to embolism of precerebral artery    Chronic renal failure    Colon polyps 07/23/2014   Tubular adenomas x 5   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   Diverticulosis of colon without hemorrhage    Eczema    ESRD (end stage renal disease)    hemodialysis 06/2014-02/2017, s/p living unrelated kidney transplant 02/26/17   Exertional dyspnea     Fatigue 01/08/2014   Fissure of skin 03/08/2020   GI bleed 07/26/2017   Headache    Heart murmur    aortic stenosis (moderate-severe 04/2021)   History of COVID-19 2023   History of unilateral nephrectomy 09/06/2013   Hydrocele, acquired 04/07/2017   Hyperlipidemia 09/06/2013   Hypertensive urgency 10/21/2021   Hyponatremia 11/09/2021   Hypothyroidism    Immunosuppressed status 02/26/2017   Induction agent: Solumedrol - Envarsus 349mdaily  - Home dose decreased from 71m88mo 3mg43m the setting of FK 15.7 while inpatient   - Previously on Prograf, though d/c'ed during previous admission; suspected this may be causing hallucinations  - Cellcept 1000mg66mH  - Prednisone 71mg d39my   Inguinal hernia without obstruction or gangrene 06/30/2017   Right inguinal wall defect appreciated by CT imaging [place] and on exam Dr. Rege tPaulla Fore Dr. Rege hPaulla Foreiscussed hernia repair with mesh at some point in near future  Last Assessment & Plan:  Formatting of this note might be different from the original. Right inguinal wall defect appreciated by CT imaging [place] and on exam Dr. Rege tPaulla Fore Dr. Rege hPaulla Foreiscussed hern   Kidney transplanted 02/26/2017   Date of Transplant = 02/26/17  ABO (Donor/Recipient) = compatible Blood type: O+/A+ DonorType: Living donor, unrelated Allograft type:Left  kidney Donor anatomy: 2 arteries, 1 veins and single ureter  Pants-to-skirt anastomosis of dual renal arteries on the backtable. Donor Kidney Bx: Not applicable Allograft injury/complications: None Ne   Lower extremity edema 09/06/2013   Macular degeneration    Mild vascular neurocognitive disorder 02/08/2022   OAB (overactive bladder) 12/28/2019   Obstructive sleep apnea 09/06/2013   On Bi-PAP  Last Assessment & Plan:  Formatting of this note might be different from the original. - History of OSA, continue CPAP at night   Peritoneal dialysis catheter in place 02/24/2015   RLQ abdominal pain 04/19/2020   S/P AVR  (aortic valve replacement) 10/05/2021   SBO (small bowel obstruction) 05/2015   Scrotal edema 03/14/2017   Testicular ultrasound did show hydrocele  Will refer to urology regarding ongoign swelling, hydrocele.   Last Assessment & Plan:  Formatting of this note might be different from the original.   Testicular ultrasound did show hydrocele  Will refer to urology regarding ongoign swelling, hydrocele.   Shortness of breath    with exertion   Stage 3b chronic kidney disease (CKD) 03/22/2014   SVT (supraventricular tachycardia) 09/06/2013   Tremor of both hands 06/29/2017   Last Assessment & Plan:  Present since transplant, stable and unchanged Query from tacrolimus -Improved tremors noted and verbalized by pt - Prograf doses titrated per FK levels  Last Assessment & Plan:  Formatting of this note might be different from the original. Present since transplant, stable and unchanged Query from tacrolimus -Improved tremors noted and verbalized by pt - Prograf doses titr   Type 2 diabetes mellitus with hyperglycemia, with long-term current use of insulin 16/07/9603   Umbilical hernia    s/p repair 04/28/15   Past Surgical History:  Procedure Laterality Date   AORTIC VALVE REPLACEMENT N/A 10/05/2021   Procedure: AORTIC VALVE REPLACEMENT (AVR) WITH INSPIRIS RESILIA AORTIC VALVE SIZE 27MM;  Surgeon: [name], MD;  Location: [place];  Service: Open Heart Surgery;  Laterality: N/A;   AV FISTULA PLACEMENT Right 02/25/2015   Procedure: RIGHT ARTERIOVENOUS (AV) FISTULA CREATION;  Surgeon: Serafina Mitchell, MD;  Location: [place];  Service: Vascular;  Laterality: Right;   AV FISTULA PLACEMENT Right 09/15/2015   Procedure: ARTERIOVENOUS (AV) FISTULA CREATION- RIGHT ARM;  Surgeon: Serafina Mitchell, MD;  Location: [place];  Service: Vascular;  Laterality: Right;   CAPD INSERTION N/A 05/18/2014   Procedure: LAPAROSCOPIC INSERTION CONTINUOUS AMBULATORY PERITONEAL DIALYSIS  (CAPD) CATHETER;  Surgeon: Ralene Ok,  MD;  Location: [place];  Service: General;  Laterality: N/A;   COLONOSCOPY     COLONOSCOPY WITH PROPOFOL N/A 07/12/2016   Procedure: COLONOSCOPY WITH PROPOFOL;  Surgeon: [name], MD;  Location: [facility];  Service: Endoscopy;  Laterality: N/A;   CORONARY ARTERY BYPASS GRAFT N/A 10/05/2021   Procedure: CORONARY ARTERY BYPASS GRAFTING (CABG) X 1, ON PUMP, USING LEFT INTERNAL MAMMARY ARTERY CONDUIT;  Surgeon: [name], MD;  Location: [place];  Service: Open Heart Surgery;  Laterality: N/A;   declotting of fistula  08/2015   ESOPHAGOGASTRODUODENOSCOPY (EGD) WITH PROPOFOL N/A 07/12/2016   Procedure: ESOPHAGOGASTRODUODENOSCOPY (EGD) WITH PROPOFOL;  Surgeon: [name], MD;  [facility]: [facility];  Service: Endoscopy;  Laterality: N/A;   FISTULA SUPERFICIALIZATION Right 02/07/2016   Procedure: RIIGHT UPPER ARM FISTULA SUPERFICIALIZATION;  Surgeon: Elam Dutch, MD;  Location: [place];  Service: Vascular;  Laterality: Right;   IJ catheter insertion     INSERTION OF DIALYSIS CATHETER Right 02/25/2015  Procedure: INSERTION OF RIGHT INTERNAL JUGULAR DIALYSIS CATHETER;  Surgeon: Serafina Mitchell, MD;  Location: [place];  Service: Vascular;  Laterality: Right;   KIDNEY TRANSPLANT     LAPAROSCOPIC REPOSITIONING CAPD CATHETER N/A 06/16/2014   Procedure: LAPAROSCOPIC REPOSITIONING CAPD CATHETER;  Surgeon: Ralene Ok, MD;  Location: [place];  Service: General;  Laterality: N/A;   LAPAROSCOPY N/A 05/04/2015   Procedure: LAPAROSCOPY DIAGNOSTIC LYSIS OF ADHESIONS;  Surgeon: Coralie Keens, MD;  Location: [place];  Service: General;  Laterality: N/A;   MINOR REMOVAL OF PERITONEAL DIALYSIS CATHETER N/A 04/28/2015   Procedure:  REMOVAL OF PERITONEAL DIALYSIS CATHETER;  Surgeon: Coralie Keens, MD;  Location: [place];  Service: General;  Laterality: N/A;   NEPHRECTOMY Left 1974   RENAL BIOPSY Right 2012   REVISON OF ARTERIOVENOUS FISTULA Right 05/26/2015   Procedure: SUPERFICIALIZATION OF ARTERIOVENOUS  FISTULA WITH SIDE BRANCH LIGATIONS;  Surgeon: Serafina Mitchell, MD;  Location: [place];  Service: Vascular;  Laterality: Right;   RIGHT/LEFT HEART CATH AND CORONARY ANGIOGRAPHY N/A 08/22/2021   Procedure: RIGHT/LEFT HEART CATH AND CORONARY ANGIOGRAPHY;  Surgeon: Troy Sine, MD;  Location: [place] [facility];  Service: Cardiovascular;  Laterality: N/A;   SKIN CANCER EXCISION     right shoulder   SVT ABLATION N/A 11/23/2016   Procedure: SVT Ablation;  Surgeon: Will Meredith Leeds, MD;  Location: [place] [facility];  Service: Cardiovascular;  Laterality: N/A;   TEE WITHOUT CARDIOVERSION N/A 10/05/2021   Procedure: TRANSESOPHAGEAL ECHOCARDIOGRAM (TEE);  Surgeon: [name], MD;  Location: [place];  Service: Open Heart Surgery;  Laterality: N/A;   UMBILICAL HERNIA REPAIR N/A 05/18/2014   Procedure: HERNIA REPAIR UMBILICAL ADULT;  Surgeon: Ralene Ok, MD;  Location: [place];  Service: General;  Laterality: N/A;   UMBILICAL HERNIA REPAIR N/A 04/28/2015   Procedure: UMBILICAL HERNIA REPAIR WITH MESH;  Surgeon: Coralie Keens, MD;  Location: [place];  Service: General;  Laterality: N/A;   Patient Active Problem List   Diagnosis Date Noted   TIA (transient ischemic attack) 05/01/2022   Pyelonephritis 04/30/2022   History of ESBL Klebsiella pneumoniae infection 04/30/2022   DNR (do not resuscitate)/DNI(Do Not Intubate) 04/30/2022   Mild vascular neurocognitive disorder 02/08/2022   Atrial fibrillation 11/29/2021   Acute cystitis without hematuria 11/09/2021   Hyponatremia 11/09/2021   Cerebrovascular accident (CVA) due to embolism of precerebral artery    History of COVID-19 2023   S/P AVR (aortic valve replacement) 10/05/2021   Exertional dyspnea    Combined arterial insufficiency and corporo-venous occlusive erectile dysfunction 12/28/2019   OAB (overactive bladder) 12/28/2019   Inguinal hernia without obstruction or gangrene 06/30/2017   Tremor of both hands 06/29/2017   Hydrocele,  acquired 04/07/2017   Scrotal edema 03/14/2017   Diabetic neuropathy 03/01/2017   Kidney transplanted 02/26/2017   Immunosuppressed status 02/26/2017   Diverticulosis of colon without hemorrhage    Morbid obesity ([place]) 02/03/2016   Type 2 diabetes mellitus with hyperglycemia, with long-term current use of insulin 07/14/2015   Chronic renal failure    Anemia in chronic kidney disease 03/30/2015   Benign essential hypertension 07/30/2014   Stage 3b chronic kidney disease (CKD) 03/22/2014   Fatigue 01/08/2014   History of unilateral nephrectomy 09/06/2013   SVT (supraventricular tachycardia) ([place]) 09/06/2013   Lower extremity edema 09/06/2013   Obstructive sleep apnea 09/06/2013   Hypothyroidism 09/06/2013   Hyperlipidemia 09/06/2013    ONSET DATE: 05/01/22  REFERRING DIAG: R42 (ICD-10-CM) - Dizziness and giddiness  THERAPY DIAG:  Dizziness and giddiness  Unsteadiness on feet  Other abnormalities of gait and mobility  Rationale for Evaluation and Treatment Rehabilitation  SUBJECTIVE:   SUBJECTIVE STATEMENT: Floor is spinning, has a headache which has intensified the vestibular symptoms  Pt accompanied by: self  PERTINENT HISTORY: a-fib, HTN (hx HTN urgency), CVA, CAD, diapetic neuropathy, ESRD, HLD, kidney transplant 2018, macular degeneration, mild vascular neurocognitive disorder, SVT, DMII, AV fistula, CABG 7341, embolic CVA subcortical L frontal lobe with [name] residual weakness.   PAIN:  Are you having pain? Yes: NPRS scale: 4/10 Pain location: mouth Pain description: ache Aggravating factors: root canal Relieving factors: nothing  PRECAUTIONS: Fall  PATIENT GOALS improve dizziness  OBJECTIVE:   TODAY'S TREATMENT: 06/26/22 Activity Comments  STS + gaze on target 5x, 5x with EC and chair in front Wooziness w/ eyes closed condition  Seated VOR x1 x  30 sec Vertical/horizontal, notes worse double vision w/ vertical. Vertical symptoms 4/10, horizontal 1/10    Standing VOR x 1 x 30 sec Vertical/horizontal, 4/10 w/ symptoms  Standing balance -Feet together EO/EC x 30 sec -semi-tandem EC x 30 sec  Standing on foam -feet apart head turns 5x--LOB with vertical head movements -5xSTS  Stair taps 3x10     TODAY'S TREATMENT: 06/20/22 Activity Comments  Vitals at start of session 148/76 mmHg, 93 bpm, 96% spO2  STS + gaze on target 5x, 5x with EC and chair in front Cues to reach back for seat  sitting VOR horizontal 2x30"   Good gaze stability but small amplitude; able to increase speed and amplitude when prompted with 2/10 dizziness   standing VOR horizontal 30"   2/10 dizziness   sitting VOR vertical 30"  3/10 dizziness   standing VOR vertical 30"  2/10 dizziness  narrow stance balance 30", with EC 30" Mild- mod sway         HOME EXERCISE PROGRAM Last updated: 06/20/22 Access Code: [email or web address] URL: [email or web address] Date: 06/20/2022 Prepared by: [facility]  Program Notes perform in corner for safety  Exercises - Standing Near Stance in Corner  - 1 x daily - 5 x weekly - 2-3 sets - 30 sec hold - Romberg Stance with Eyes Closed  - 1 x daily - 5 x weekly - 2-3 sets - 30 sec hold - Standing Tandem Balance with Counter Support  - 1 x daily - 5 x weekly - 2 sets - 30 sec hold - Standing Toe Taps  - 1 x daily - 5 x weekly - 2 sets - 10 reps - Standing Gaze Stabilization with Head Rotation  - 1 x daily - 5 x weekly - 2-3 sets - 30 sec hold - Standing Gaze Stabilization with Head Nod  - 1 x daily - 5 x weekly - 2-3 sets - 30 sec hold    PATIENT EDUCATION: Education details: HEP update, exam findings Person educated: Patient Education method: Explanation, Demonstration, Tactile cues, Verbal cues, and Handouts Education comprehension: verbalized understanding and returned demonstration    Below measures were taken at time of initial evaluation unless otherwise specified:   Vitals:  142/78 mmHg 98%, 71bpm  DIAGNOSTIC FINDINGS:  05/01/22 head MRI: Mild-to-moderate chronic microvascular ischemic disease with a few scattered remote lacunar infarcts involving the left hemispheric cerebral white matter.  05/01/22 head MRA: Irregularity with possible 8 mm outpouching extending inferiorly from the left ICA terminus. While this finding is favored to be artifactual, a possible aneurysm is  difficult to exclude  05/01/22 neck MRA: Grossly patent carotid artery systems and vertebral arteries within the neck. No obvious hemodynamically significant stenosis.   COGNITION: Overall cognitive status: Within functional limits for tasks assessed   SENSATION: Reports limited sensation in B hands/feet  POSTURE: rounded shoulders, forward head, and frequently bending forward/readjusting in seat; slightly SOB which patient reports is normal for him  GAIT: Gait pattern:  Gait very slow and cautious with L toe out Assistive device utilized: Single point cane  PATIENT SURVEYS:  FOTO [phone]   VESTIBULAR ASSESSMENT   GENERAL OBSERVATION: patient is wearing progressive lenses which he wears most time      OCULOMOTOR EXAM:   Ocular Alignment: normal; L orbit slightly elevated    Ocular ROM: No Limitations   Spontaneous Nystagmus: absent   Gaze-Induced Nystagmus: absent   Smooth Pursuits: saccades and superiorly 1-2    Saccades: hypometric/undershoots and slow more evident vertically than horizontally    Convergence/Divergence: reports diplopia at any distance     VESTIBULAR - OCULAR REFLEX:    Slow VOR: Comment: very slow and guarded horizontal; slightly better vertical   VOR Cancellation: Normal; c/o slight increase in dizziness    Head-Impulse Test: HIT Right: positive HIT Left: covert positive        POSITIONAL TESTING:  Right Roll Test: negative Left Roll Test: c/o increased dizziness; Duration: 10 sec Right Sidelying: negative except for c/o woozing upon sitting  up Left Sidelying: negative except for c/o woozing upon sitting up    VESTIBULAR TREATMENT:  PATIENT EDUCATION: Education details: prognosis, POC, HEP; educated on using RW rather than SPC d/t frequency of falls with hx of head trauma  Person educated: Patient Education method: Explanation, Demonstration, Tactile cues, Verbal cues, and Handouts Education comprehension: verbalized understanding and returned demonstration   GOALS: Goals reviewed with patient? Yes  SHORT TERM GOALS: Target date: 06/06/2022  Patient to be independent with initial HEP. Baseline: HEP initiated Goal status:PARTIALLY MET-needs cues, but is performing at home 06/14/2022    LONG TERM GOALS: Target date: 07/11/2022  Patient to be independent with advanced HEP. Baseline: Not yet initiated  Goal status: IN PROGRESS  Patient to report 0/10 dizziness with standing vertical and horizontal VOR for 30 seconds. Baseline: Unable Goal status: IN PROGRESS  Patient will report 0/10 dizziness with bed mobility.  Baseline: Symptomatic  Goal status: IN PROGRESS  Patient to demonstrate mild-moderate sway with M-CTSIB condition with eyes closed/firm surface in order to improve safety in environments with dim lighting. Baseline: NT Goal status: IN PROGRESS  Patient to score at least 43/56 on Berg in order to decrease risk of falls. Baseline: NT Goal status: IN PROGRESS    ASSESSMENT:  CLINICAL IMPRESSION: Pt notes some improvement in activity tolerance and notes worse symptoms with VOR in standing vs sitting. Progressed to standing on compliant surface maintining feet apart position and eyes open condition but coupled with head movements for challenge and stimulation, experiences two episodes of retro-LOB under these conditions.  Pt notes pain origin of headache eminating from base of neck, top of head, and into his supraorbital area. Possible need for investigating cervicogenic origin?  OBJECTIVE IMPAIRMENTS  Abnormal gait, decreased balance, decreased endurance, decreased mobility, difficulty walking, dizziness, postural dysfunction, and pain.   ACTIVITY LIMITATIONS carrying, lifting, bending, sitting, standing, squatting, sleeping, stairs, transfers, bed mobility, bathing, toileting, dressing, reach over head, hygiene/grooming, and locomotion level  PARTICIPATION LIMITATIONS: meal prep, cleaning, laundry, driving, shopping, community activity, and church  PERSONAL  FACTORS Age, Fitness, Past/current experiences, Time since onset of injury/illness/exacerbation, and 3+ comorbidities: a-fib, HTN (hx HTN urgency), CVA, CAD, diapetic neuropathy, ESRD, HLD, kidney transplant 2018, macular degeneration, mild vascular neurocognitive disorder, SVT, DMII, AV fistula, CABG 3475, embolic CVA subcortical L frontal lobe with [name] residual weakness.  are also affecting patient's functional outcome.   REHAB POTENTIAL: Good  CLINICAL DECISION MAKING: Evolving/moderate complexity  EVALUATION COMPLEXITY: Moderate   PLAN: PT FREQUENCY: 2x/week  PT DURATION: 8 weeks  PLANNED INTERVENTIONS: Therapeutic exercises, Therapeutic activity, Neuromuscular re-education, Balance training, Gait training, Patient/Family education, Self Care, Joint mobilization, Stair training, Vestibular training, Canalith repositioning, Dry Needling, Electrical stimulation, Cryotherapy, Moist heat, Manual therapy, and Re-evaluation  PLAN FOR NEXT SESSION: Turns, MCTSIB, progress VOR and habituation, compliant surfaces, ?convergence exercises?, general activity progression for habituation of dizziness symptoms   1:57 PM, 06/26/22 M. Sherlyn Lees, PT, DPT Physical Therapist- [place] Office Number: [phone]   [place] at [facility] [address], [place], [place] [address] Phone # [phone] Fax # [phone]

## 2022-06-27 NOTE — Telephone Encounter (Signed)
Chart reviewed. Preop team has not received a call back from this patient yet. I reached out. Got no answer, LMTCB. Dr. Doyne Keel addendum to 06/21/22 notes states " Amy in looking through this record, he had 1 pretty small adenoma removed in 2017.  Based on changes in surveillance guidelines in recent years, I actually think okay to extend him to 7 years from his last exam, so he would not be due until October 2024.  Can you let him know I think it is okay to do his exam at that time in 07/2023, he is higher risk coming off Eliquis, I think that is reasonable unless he feels otherwise. Thanks."  I will reach out to Amy, PA to clarify their plans for procedure since we have otherwise not heard back from patient yet. If colonoscopy is deferred, we will cancel out request from our box until we receive another one next year. Amy - Please route response to [facility] PREOP (the pre-op pool). Thank you.

## 2022-06-28 ENCOUNTER — Telehealth: Payer: Self-pay

## 2022-06-28 ENCOUNTER — Encounter: Payer: 59 | Admitting: Physical Therapy

## 2022-06-28 ENCOUNTER — Encounter: Payer: Self-pay | Admitting: Cardiovascular Disease

## 2022-06-28 ENCOUNTER — Encounter: Payer: Self-pay | Admitting: Neurology

## 2022-06-28 ENCOUNTER — Ambulatory Visit: Payer: 59

## 2022-06-28 DIAGNOSIS — R42 Dizziness and giddiness: Secondary | ICD-10-CM

## 2022-06-28 DIAGNOSIS — R2681 Unsteadiness on feet: Secondary | ICD-10-CM

## 2022-06-28 DIAGNOSIS — R2689 Other abnormalities of gait and mobility: Secondary | ICD-10-CM

## 2022-06-28 NOTE — Therapy (Signed)
OUTPATIENT PHYSICAL THERAPY VESTIBULAR TREATMENT     Patient Name: Robert [name] MRN: [id number] DOB:[birth year], 61 y.o., male Today's Date: 06/28/2022  PCP: Haywood Pao, MD REFERRING PROVIDER: Geoffery Lyons, NP   PT End of Session - 06/28/22 1015     Visit Number 7    Number of Visits 17    Date for PT Re-Evaluation 07/11/22    Authorization Type U[employer]    PT Start Time 1638    PT Stop Time 1100    PT Time Calculation (min) 45 min    Activity Tolerance Patient limited by lethargy    Behavior During Therapy [facility] for tasks assessed/performed                 Past Medical History:  Diagnosis Date   Acute cystitis without hematuria 11/09/2021   AKI (acute kidney injury) 08/08/2017   Elevated sCr to 2.5 (baseline ~1.7) noted at OSH - Subsequent transfer to [facility]  - Currently on IVMF in the setting of diarrhea and AKI - sCr at baseline at time of discharge   Anemia in chronic kidney disease 03/30/2015   Anticoagulated on Coumadin 11/09/2021   Aortic valve stenosis    Arthritis    Atrial fibrillation 11/29/2021   Benign essential hypertension 07/30/2014   prior to transplant pt took Metorpolol tartrate 50mg  qd on off HD days   Benign neoplasm of transverse colon    Cancer    skin ca right shoulder, plastic dsyplasia, pre-Ca polpys removed on Colonoscopy- 07/2014   Cerebrovascular accident (CVA) due to embolism of precerebral artery    Chronic renal failure    Colon polyps 07/23/2014   Tubular adenomas x 5   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   Diverticulosis of colon without hemorrhage    Eczema    ESRD (end stage renal disease)    hemodialysis 06/2014-02/2017, s/p living unrelated kidney transplant 02/26/17   Exertional dyspnea     Fatigue 01/08/2014   Fissure of skin 03/08/2020   GI bleed 07/26/2017   Headache    Heart murmur    aortic stenosis (moderate-severe 04/2021)   History of COVID-19 2023   History of unilateral nephrectomy 09/06/2013   Hydrocele, acquired 04/07/2017   Hyperlipidemia 09/06/2013   Hypertensive urgency 10/21/2021   Hyponatremia 11/09/2021   Hypothyroidism    Immunosuppressed status 02/26/2017   Induction agent: Solumedrol - Envarsus 3mg  daily  - Home dose decreased from 5mg  to 3mg  in the setting of FK 15.7 while inpatient   - Previously on Prograf, though d/c'ed during previous admission; suspected this may be causing hallucinations  - Cellcept 1000mg  Q12H  - Prednisone 5mg  daily   Inguinal hernia without obstruction or gangrene 06/30/2017   Right inguinal wall defect appreciated by CT imaging [place] and on exam Dr. Paulla Fore today Dr. Paulla Fore has discussed hernia repair with mesh at some point in near future  Last Assessment & Plan:  Formatting of this note might be different from the original. Right inguinal wall defect appreciated by CT imaging [place] and on exam Dr. Paulla Fore today Dr. Paulla Fore has discussed hern   Kidney transplanted 02/26/2017   Date of Transplant = 02/26/17  ABO (Donor/Recipient) = compatible Blood type: O+/A+ DonorType: Living donor, unrelated Allograft type:Left  kidney Donor anatomy: 2 arteries, 1 veins and single ureter  Pants-to-skirt anastomosis of dual renal arteries on the backtable. Donor Kidney Bx: Not applicable Allograft injury/complications: None Ne   Lower extremity edema 09/06/2013   Macular degeneration    Mild vascular neurocognitive disorder 02/08/2022   OAB (overactive bladder) 12/28/2019   Obstructive sleep apnea 09/06/2013   On Bi-PAP  Last Assessment & Plan:  Formatting of this note might be different from the original. - History of OSA, continue CPAP at night   Peritoneal dialysis catheter in place 02/24/2015   RLQ abdominal pain 04/19/2020   S/P AVR  (aortic valve replacement) 10/05/2021   SBO (small bowel obstruction) 05/2015   Scrotal edema 03/14/2017   Testicular ultrasound did show hydrocele  Will refer to urology regarding ongoign swelling, hydrocele.   Last Assessment & Plan:  Formatting of this note might be different from the original.   Testicular ultrasound did show hydrocele  Will refer to urology regarding ongoign swelling, hydrocele.   Shortness of breath    with exertion   Stage 3b chronic kidney disease (CKD) 03/22/2014   SVT (supraventricular tachycardia) 09/06/2013   Tremor of both hands 06/29/2017   Last Assessment & Plan:  Present since transplant, stable and unchanged Query from tacrolimus -Improved tremors noted and verbalized by pt - Prograf doses titrated per FK levels  Last Assessment & Plan:  Formatting of this note might be different from the original. Present since transplant, stable and unchanged Query from tacrolimus -Improved tremors noted and verbalized by pt - Prograf doses titr   Type 2 diabetes mellitus with hyperglycemia, with long-term current use of insulin 16/07/9603   Umbilical hernia    s/p repair 04/28/15   Past Surgical History:  Procedure Laterality Date   AORTIC VALVE REPLACEMENT N/A 10/05/2021   Procedure: AORTIC VALVE REPLACEMENT (AVR) WITH INSPIRIS RESILIA AORTIC VALVE SIZE 27MM;  Surgeon: [name], MD;  Location: [place];  Service: Open Heart Surgery;  Laterality: N/A;   AV FISTULA PLACEMENT Right 02/25/2015   Procedure: RIGHT ARTERIOVENOUS (AV) FISTULA CREATION;  Surgeon: Serafina Mitchell, MD;  Location: [place];  Service: Vascular;  Laterality: Right;   AV FISTULA PLACEMENT Right 09/15/2015   Procedure: ARTERIOVENOUS (AV) FISTULA CREATION- RIGHT ARM;  Surgeon: Serafina Mitchell, MD;  Location: [place];  Service: Vascular;  Laterality: Right;   CAPD INSERTION N/A 05/18/2014   Procedure: LAPAROSCOPIC INSERTION CONTINUOUS AMBULATORY PERITONEAL DIALYSIS  (CAPD) CATHETER;  Surgeon: Ralene Ok,  MD;  Location: [place];  Service: General;  Laterality: N/A;   COLONOSCOPY     COLONOSCOPY WITH PROPOFOL N/A 07/12/2016   Procedure: COLONOSCOPY WITH PROPOFOL;  Surgeon: [name], MD;  Location: [facility];  Service: Endoscopy;  Laterality: N/A;   CORONARY ARTERY BYPASS GRAFT N/A 10/05/2021   Procedure: CORONARY ARTERY BYPASS GRAFTING (CABG) X 1, ON PUMP, USING LEFT INTERNAL MAMMARY ARTERY CONDUIT;  Surgeon: [name], MD;  Location: [place];  Service: Open Heart Surgery;  Laterality: N/A;   declotting of fistula  08/2015   ESOPHAGOGASTRODUODENOSCOPY (EGD) WITH PROPOFOL N/A 07/12/2016   Procedure: ESOPHAGOGASTRODUODENOSCOPY (EGD) WITH PROPOFOL;  Surgeon: [name], MD;  Location: [facility];  Service: Endoscopy;  Laterality: N/A;   FISTULA SUPERFICIALIZATION Right 02/07/2016   Procedure: RIIGHT UPPER ARM FISTULA SUPERFICIALIZATION;  Surgeon: Elam Dutch, MD;  Location: [place];  Service: Vascular;  Laterality: Right;   IJ catheter insertion     INSERTION OF DIALYSIS CATHETER Right 02/25/2015  Procedure: INSERTION OF RIGHT INTERNAL JUGULAR DIALYSIS CATHETER;  Surgeon: Serafina Mitchell, MD;  Location: [place];  Service: Vascular;  Laterality: Right;   KIDNEY TRANSPLANT     LAPAROSCOPIC REPOSITIONING CAPD CATHETER N/A 06/16/2014   Procedure: LAPAROSCOPIC REPOSITIONING CAPD CATHETER;  Surgeon: Ralene Ok, MD;  Location: [place];  Service: General;  Laterality: N/A;   LAPAROSCOPY N/A 05/04/2015   Procedure: LAPAROSCOPY DIAGNOSTIC LYSIS OF ADHESIONS;  Surgeon: Coralie Keens, MD;  Location: [place];  Service: General;  Laterality: N/A;   MINOR REMOVAL OF PERITONEAL DIALYSIS CATHETER N/A 04/28/2015   Procedure:  REMOVAL OF PERITONEAL DIALYSIS CATHETER;  Surgeon: Coralie Keens, MD;  Location: [place];  Service: General;  Laterality: N/A;   NEPHRECTOMY Left 1974   RENAL BIOPSY Right 2012   REVISON OF ARTERIOVENOUS FISTULA Right 05/26/2015   Procedure: SUPERFICIALIZATION OF ARTERIOVENOUS  FISTULA WITH SIDE BRANCH LIGATIONS;  Surgeon: Serafina Mitchell, MD;  Location: [place];  Service: Vascular;  Laterality: Right;   RIGHT/LEFT HEART CATH AND CORONARY ANGIOGRAPHY N/A 08/22/2021   Procedure: RIGHT/LEFT HEART CATH AND CORONARY ANGIOGRAPHY;  Surgeon: Troy Sine, MD;  Location: [place] [facility];  Service: Cardiovascular;  Laterality: N/A;   SKIN CANCER EXCISION     right shoulder   SVT ABLATION N/A 11/23/2016   Procedure: SVT Ablation;  Surgeon: Will Meredith Leeds, MD;  Location: [place] [facility];  Service: Cardiovascular;  Laterality: N/A;   TEE WITHOUT CARDIOVERSION N/A 10/05/2021   Procedure: TRANSESOPHAGEAL ECHOCARDIOGRAM (TEE);  Surgeon: [name], MD;  Location: [place];  Service: Open Heart Surgery;  Laterality: N/A;   UMBILICAL HERNIA REPAIR N/A 05/18/2014   Procedure: HERNIA REPAIR UMBILICAL ADULT;  Surgeon: Ralene Ok, MD;  Location: [place];  Service: General;  Laterality: N/A;   UMBILICAL HERNIA REPAIR N/A 04/28/2015   Procedure: UMBILICAL HERNIA REPAIR WITH MESH;  Surgeon: Coralie Keens, MD;  Location: [place];  Service: General;  Laterality: N/A;   Patient Active Problem List   Diagnosis Date Noted   TIA (transient ischemic attack) 05/01/2022   Pyelonephritis 04/30/2022   History of ESBL Klebsiella pneumoniae infection 04/30/2022   DNR (do not resuscitate)/DNI(Do Not Intubate) 04/30/2022   Mild vascular neurocognitive disorder 02/08/2022   Atrial fibrillation 11/29/2021   Acute cystitis without hematuria 11/09/2021   Hyponatremia 11/09/2021   Cerebrovascular accident (CVA) due to embolism of precerebral artery    History of COVID-19 2023   S/P AVR (aortic valve replacement) 10/05/2021   Exertional dyspnea    Combined arterial insufficiency and corporo-venous occlusive erectile dysfunction 12/28/2019   OAB (overactive bladder) 12/28/2019   Inguinal hernia without obstruction or gangrene 06/30/2017   Tremor of both hands 06/29/2017   Hydrocele,  acquired 04/07/2017   Scrotal edema 03/14/2017   Diabetic neuropathy 03/01/2017   Kidney transplanted 02/26/2017   Immunosuppressed status 02/26/2017   Diverticulosis of colon without hemorrhage    Morbid obesity ([place]) 02/03/2016   Type 2 diabetes mellitus with hyperglycemia, with long-term current use of insulin 07/14/2015   Chronic renal failure    Anemia in chronic kidney disease 03/30/2015   Benign essential hypertension 07/30/2014   Stage 3b chronic kidney disease (CKD) 03/22/2014   Fatigue 01/08/2014   History of unilateral nephrectomy 09/06/2013   SVT (supraventricular tachycardia) ([place]) 09/06/2013   Lower extremity edema 09/06/2013   Obstructive sleep apnea 09/06/2013   Hypothyroidism 09/06/2013   Hyperlipidemia 09/06/2013    ONSET DATE: 05/01/22  REFERRING DIAG: R42 (ICD-10-CM) - Dizziness and giddiness  THERAPY DIAG:  Dizziness and giddiness  Unsteadiness on feet  Other abnormalities of gait and mobility  Rationale for Evaluation and Treatment Rehabilitation  SUBJECTIVE:   SUBJECTIVE STATEMENT: Had a fall in the kitchen last night when tripped over rug/mat and reports increase in low back pain, denies striking head. Reports that his spinning floor and dizziness have not been made worse by this.  Pt accompanied by: self  PERTINENT HISTORY: a-fib, HTN (hx HTN urgency), CVA, CAD, diapetic neuropathy, ESRD, HLD, kidney transplant 2018, macular degeneration, mild vascular neurocognitive disorder, SVT, DMII, AV fistula, CABG 8453, embolic CVA subcortical L frontal lobe with [name] residual weakness.   PAIN:  Are you having pain? Yes: NPRS scale: 6/10 Pain location: low back Pain description: sore/pain Aggravating factors: activity/walking Relieving factors: unable to take medication for it Offered ice pack but declines  PRECAUTIONS: Fall  PATIENT GOALS improve dizziness  OBJECTIVE:   TODAY'S TREATMENT: 06/28/22 Activity Comments  Seated VOR x 1  Vertical/horizontal 3x45 sec 3/10 vertical/horizontal symptoms  Self Head Impulse drill 5x left/right, difficulty in maintaining gaze stability with turning away from target  Standing VOR x 1 1x30 sec, LOB increase sway  Brandt-Daroff  2-3/10 symptoms on right, feeling of nausea with left x 2 trials. Repeated with wedge bolster to limit amount of sidelying/head extension which reduced symptoms          TODAY'S TREATMENT: 06/26/22 Activity Comments  STS + gaze on target 5x, 5x with EC and chair in front Wooziness w/ eyes closed condition  Seated VOR x1 x  30 sec Vertical/horizontal, notes worse double vision w/ vertical. Vertical symptoms 4/10, horizontal 1/10   Standing VOR x 1 x 30 sec Vertical/horizontal, 4/10 w/ symptoms  Standing balance -Feet together EO/EC x 30 sec -semi-tandem EC x 30 sec  Standing on foam -feet apart head turns 5x--LOB with vertical head movements -5xSTS  Stair taps 3x10     TODAY'S TREATMENT: 06/20/22 Activity Comments  Vitals at start of session 148/76 mmHg, 93 bpm, 96% spO2  STS + gaze on target 5x, 5x with EC and chair in front Cues to reach back for seat  sitting VOR horizontal 2x30"   Good gaze stability but small amplitude; able to increase speed and amplitude when prompted with 2/10 dizziness   standing VOR horizontal 30"   2/10 dizziness   sitting VOR vertical 30"  3/10 dizziness   standing VOR vertical 30"  2/10 dizziness  narrow stance balance 30", with EC 30" Mild- mod sway         HOME EXERCISE PROGRAM Last updated: 06/20/22 Access Code: [email or web address] URL: [email or web address] Date: 06/20/2022 Prepared by: [facility]  Program Notes perform in corner for safety  Exercises - Standing Near Stance in Corner  - 1 x daily - 5 x weekly - 2-3 sets - 30 sec hold - Romberg Stance with Eyes Closed  - 1 x daily - 5 x weekly - 2-3 sets - 30 sec hold - Standing Tandem Balance with Counter Support   - 1 x daily - 5 x weekly - 2 sets - 30 sec hold - Standing Toe Taps  - 1 x daily - 5 x weekly - 2 sets - 10 reps - Standing Gaze Stabilization with Head Rotation  - 1 x daily - 5 x weekly - 2-3 sets - 30 sec hold - Standing Gaze Stabilization with Head Nod  - 1 x daily - 5 x weekly -  2-3 sets - 30 sec hold - Brandt-Daroff Vestibular Exercise  - 1 x daily - 7 x weekly - 5-10 reps -Self-induced Head Impulse activity 5x each side   PATIENT EDUCATION: Education details: HEP update, exam findings Person educated: Patient Education method: Explanation, Demonstration, Tactile cues, Verbal cues, and Handouts Education comprehension: verbalized understanding and returned demonstration    Below measures were taken at time of initial evaluation unless otherwise specified:   Vitals: 142/78 mmHg 98%, 71bpm  DIAGNOSTIC FINDINGS:  05/01/22 head MRI: Mild-to-moderate chronic microvascular ischemic disease with a few scattered remote lacunar infarcts involving the left hemispheric cerebral white matter.  05/01/22 head MRA: Irregularity with possible 8 mm outpouching extending inferiorly from the left ICA terminus. While this finding is favored to be artifactual, a possible aneurysm is difficult to exclude  05/01/22 neck MRA: Grossly patent carotid artery systems and vertebral arteries within the neck. No obvious hemodynamically significant stenosis.   COGNITION: Overall cognitive status: Within functional limits for tasks assessed   SENSATION: Reports limited sensation in B hands/feet  POSTURE: rounded shoulders, forward head, and frequently bending forward/readjusting in seat; slightly SOB which patient reports is normal for him  GAIT: Gait pattern:  Gait very slow and cautious with L toe out Assistive device utilized: Single point cane  PATIENT SURVEYS:  FOTO [phone]   VESTIBULAR ASSESSMENT   GENERAL OBSERVATION: patient is wearing progressive lenses which he wears most time       OCULOMOTOR EXAM:   Ocular Alignment: normal; L orbit slightly elevated    Ocular ROM: No Limitations   Spontaneous Nystagmus: absent   Gaze-Induced Nystagmus: absent   Smooth Pursuits: saccades and superiorly 1-2    Saccades: hypometric/undershoots and slow more evident vertically than horizontally    Convergence/Divergence: reports diplopia at any distance     VESTIBULAR - OCULAR REFLEX:    Slow VOR: Comment: very slow and guarded horizontal; slightly better vertical   VOR Cancellation: Normal; c/o slight increase in dizziness    Head-Impulse Test: HIT Right: positive HIT Left: covert positive        POSITIONAL TESTING:  Right Roll Test: negative Left Roll Test: c/o increased dizziness; Duration: 10 sec Right Sidelying: negative except for c/o woozing upon sitting up Left Sidelying: negative except for c/o woozing upon sitting up    VESTIBULAR TREATMENT:  PATIENT EDUCATION: Education details: prognosis, POC, HEP; educated on using RW rather than SPC d/t frequency of falls with hx of head trauma  Person educated: Patient Education method: Explanation, Demonstration, Tactile cues, Verbal cues, and Handouts Education comprehension: verbalized understanding and returned demonstration   GOALS: Goals reviewed with patient? Yes  SHORT TERM GOALS: Target date: 06/06/2022  Patient to be independent with initial HEP. Baseline: HEP initiated Goal status:PARTIALLY MET-needs cues, but is performing at home 06/14/2022    LONG TERM GOALS: Target date: 07/11/2022  Patient to be independent with advanced HEP. Baseline: Not yet initiated  Goal status: IN PROGRESS  Patient to report 0/10 dizziness with standing vertical and horizontal VOR for 30 seconds. Baseline: Unable Goal status: IN PROGRESS  Patient will report 0/10 dizziness with bed mobility.  Baseline: Symptomatic  Goal status: IN PROGRESS  Patient to demonstrate mild-moderate sway with M-CTSIB condition with eyes  closed/firm surface in order to improve safety in environments with dim lighting. Baseline: NT Goal status: IN PROGRESS  Patient to score at least 43/56 on Berg in order to decrease risk of falls. Baseline: NT Goal status: IN PROGRESS  ASSESSMENT:  CLINICAL IMPRESSION: Pt reports falling in kitchen last night causing low back soreness, no red flag symptoms reported following questioning.  Continued with vestibular rehabilitation activities with emphasis on gaze stabilization.  Attempted several trials in standing but back pain and LOB with increase in postural sway noted/reported; thus, majority of activities performed in sitting today. Initiated habituation with Brandt-Daroff and pt exhibits dramatic response to left sidelying vs right. Demonstrates pronounced postural sway and need for wide BOS in standing/walking. Advised to use RW due to recent fall and his unsteadiness in gait but declines its use at this time. Continued sessions to progress vesitbular rehabilation and balance activities.   OBJECTIVE IMPAIRMENTS Abnormal gait, decreased balance, decreased endurance, decreased mobility, difficulty walking, dizziness, postural dysfunction, and pain.   ACTIVITY LIMITATIONS carrying, lifting, bending, sitting, standing, squatting, sleeping, stairs, transfers, bed mobility, bathing, toileting, dressing, reach over head, hygiene/grooming, and locomotion level  PARTICIPATION LIMITATIONS: meal prep, cleaning, laundry, driving, shopping, community activity, and church  PERSONAL FACTORS Age, Fitness, Past/current experiences, Time since onset of injury/illness/exacerbation, and 3+ comorbidities: a-fib, HTN (hx HTN urgency), CVA, CAD, diapetic neuropathy, ESRD, HLD, kidney transplant 2018, macular degeneration, mild vascular neurocognitive disorder, SVT, DMII, AV fistula, CABG 6599, embolic CVA subcortical L frontal lobe with [name] residual weakness.  are also affecting patient's functional outcome.    REHAB POTENTIAL: Good  CLINICAL DECISION MAKING: Evolving/moderate complexity  EVALUATION COMPLEXITY: Moderate   PLAN: PT FREQUENCY: 2x/week  PT DURATION: 8 weeks  PLANNED INTERVENTIONS: Therapeutic exercises, Therapeutic activity, Neuromuscular re-education, Balance training, Gait training, Patient/Family education, Self Care, Joint mobilization, Stair training, Vestibular training, Canalith repositioning, Dry Needling, Electrical stimulation, Cryotherapy, Moist heat, Manual therapy, and Re-evaluation  PLAN FOR NEXT SESSION: Turns, MCTSIB, progress VOR and habituation, compliant surfaces, ?convergence exercises?, general activity progression for habituation of dizziness symptoms   10:16 AM, 06/28/22 M. Sherlyn Lees, PT, DPT Physical Therapist- [place] Office Number: [phone]   [facility] [address] [place], [place] [address] Phone # [phone] Fax # [phone]

## 2022-06-28 NOTE — Telephone Encounter (Signed)
Pt called he answer the of WHO IS THIS? Pt told this is Syniah Berne from [facility] with Dr Tomi Likens office I was calling him in regards to his my chart message. Dr Tomi Likens recommended that he increase his nortriptyline from '10mg'$  to 25 mg. Pt started cussing state "OH HELL NO IM NOT HELL NO " " that nortriptyline makes me stupid HELL NO' pt informed that he does not need to cuss at me I am just letting him know what Dr Tomi Likens was reccommended and for him to have a nice day

## 2022-06-29 ENCOUNTER — Other Ambulatory Visit: Payer: Self-pay

## 2022-06-29 ENCOUNTER — Telehealth: Payer: Self-pay | Admitting: Neurology

## 2022-06-29 MED ORDER — ZONISAMIDE 25 MG PO CAPS: ORAL_CAPSULE | ORAL | 1 refills | Status: DC

## 2022-06-29 NOTE — Telephone Encounter (Signed)
Pt wife called advised pt needs to go to the ER for treatment she stated that he will not go he will not even talk to her. She stated that he has told her his headaches are better, she asked about the change in medication I told her about pt going off when we tried to increase nortriptyline and so Dr Tomi Likens changed the medication. Pt wife stated he is trying to get on disability as well. She is asking for Dr Tomi Likens to be the one to call her back,

## 2022-06-29 NOTE — Telephone Encounter (Signed)
Pt's wife called in stating his neighbor noticed the pt was passed out in his car with the music blaring. They had to bang on the windows to get him to wake up. He denied he took any sedatives. The neighbor thinks he may be suicidal. The pt's wife says he has bad behavior a lot and she was almost afraid to sleep in the house with him last night.

## 2022-06-29 NOTE — Telephone Encounter (Signed)
Pt told in [place] message

## 2022-07-02 ENCOUNTER — Telehealth: Payer: Self-pay | Admitting: Cardiovascular Disease

## 2022-07-02 ENCOUNTER — Telehealth: Payer: Self-pay | Admitting: Neurology

## 2022-07-02 NOTE — Therapy (Signed)
OUTPATIENT PHYSICAL THERAPY VESTIBULAR TREATMENT     Patient Name: Robert [name] MRN: [id number] DOB:1960/11/19, 61 y.o., male Today's Date: 07/03/2022  PCP: Haywood Pao, MD REFERRING PROVIDER: Geoffery Lyons, NP   PT End of Session - 07/03/22 1101     Visit Number 8    Number of Visits 17    Date for PT Re-Evaluation 07/11/22    Authorization Type UHC    PT Start Time 1028   pt late   PT Stop Time 1101    PT Time Calculation (min) 33 min    Equipment Utilized During Treatment Gait belt    Activity Tolerance Patient tolerated treatment well;Patient limited by pain    Behavior During Therapy WFL for tasks assessed/performed                  Past Medical History:  Diagnosis Date   Acute cystitis without hematuria 11/09/2021   AKI (acute kidney injury) 08/08/2017   Elevated sCr to 2.5 (baseline ~1.7) noted at OSH - Subsequent transfer to [name]  - Currently on IVMF in the setting of diarrhea and AKI - sCr at baseline at time of discharge   Anemia in chronic kidney disease 03/30/2015   Anticoagulated on Coumadin 11/09/2021   Aortic valve stenosis    Arthritis    Atrial fibrillation 11/29/2021   Benign essential hypertension 07/30/2014   prior to transplant pt took Metorpolol tartrate 50mg  qd on off HD days   Benign neoplasm of transverse colon    Cancer    skin ca right shoulder, plastic dsyplasia, pre-Ca polpys removed on Colonoscopy- 07/2014   Cerebrovascular accident (CVA) due to embolism of precerebral artery    Chronic renal failure    Colon polyps 07/23/2014   Tubular adenomas x 5   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   Diverticulosis of colon without hemorrhage    Eczema    ESRD (end stage renal disease)     hemodialysis 06/2014-02/2017, s/p living unrelated kidney transplant 02/26/17   Exertional dyspnea    Fatigue 01/08/2014   Fissure of skin 03/08/2020   GI bleed 07/26/2017   Headache    Heart murmur    aortic stenosis (moderate-severe 04/2021)   History of COVID-19 2023   History of unilateral nephrectomy 09/06/2013   Hydrocele, acquired 04/07/2017   Hyperlipidemia 09/06/2013   Hypertensive urgency 10/21/2021   Hyponatremia 11/09/2021   Hypothyroidism    Immunosuppressed status 02/26/2017   Induction agent: Solumedrol - Envarsus 3mg  daily  - Home dose decreased from 5mg  to 3mg  in the setting of FK 15.7 while inpatient   - Previously on Prograf, though d/c'ed during previous admission; suspected this may be causing hallucinations  - Cellcept 1000mg  Q12H  - Prednisone 5mg  daily   Inguinal hernia without obstruction or gangrene 06/30/2017   Right inguinal wall defect appreciated by CT imaging [place] and on exam Dr. Paulla Fore today Dr. Paulla Fore has discussed hernia repair with mesh at some point in near future  Last Assessment & Plan:  Formatting of this note might be different from the original. Right inguinal wall defect appreciated by CT imaging [place] and on exam Dr. Paulla Fore today Dr. Paulla Fore has discussed hern   Kidney transplanted 02/26/2017   Date of Transplant =  02/26/17  ABO (Donor/Recipient) = compatible Blood type: O+/A+ DonorType: Living donor, unrelated Allograft type:Left kidney Donor anatomy: 2 arteries, 1 veins and single ureter  Pants-to-skirt anastomosis of dual renal arteries on the backtable. Donor Kidney Bx: Not applicable Allograft injury/complications: None Ne   Lower extremity edema 09/06/2013   Macular degeneration    Mild vascular neurocognitive disorder 02/08/2022   OAB (overactive bladder) 12/28/2019   Obstructive sleep apnea 09/06/2013   On Bi-PAP  Last Assessment & Plan:  Formatting of this note might be different from the original. - History of OSA, continue CPAP at night    Peritoneal dialysis catheter in place 02/24/2015   RLQ abdominal pain 04/19/2020   S/P AVR (aortic valve replacement) 10/05/2021   SBO (small bowel obstruction) 05/2015   Scrotal edema 03/14/2017   Testicular ultrasound did show hydrocele  Will refer to urology regarding ongoign swelling, hydrocele.   Last Assessment & Plan:  Formatting of this note might be different from the original.   Testicular ultrasound did show hydrocele  Will refer to urology regarding ongoign swelling, hydrocele.   Shortness of breath    with exertion   Stage 3b chronic kidney disease (CKD) 03/22/2014   SVT (supraventricular tachycardia) 09/06/2013   Tremor of both hands 06/29/2017   Last Assessment & Plan:  Present since transplant, stable and unchanged Query from tacrolimus -Improved tremors noted and verbalized by pt - Prograf doses titrated per FK levels  Last Assessment & Plan:  Formatting of this note might be different from the original. Present since transplant, stable and unchanged Query from tacrolimus -Improved tremors noted and verbalized by pt - Prograf doses titr   Type 2 diabetes mellitus with hyperglycemia, with long-term current use of insulin 19/62/2297   Umbilical hernia    s/p repair 04/28/15   Past Surgical History:  Procedure Laterality Date   AORTIC VALVE REPLACEMENT N/A 10/05/2021   Procedure: AORTIC VALVE REPLACEMENT (AVR) WITH INSPIRIS RESILIA AORTIC VALVE SIZE 27MM;  Surgeon: [name], MD;  Location: [place];  Service: Open Heart Surgery;  Laterality: N/A;   AV FISTULA PLACEMENT Right 02/25/2015   Procedure: RIGHT ARTERIOVENOUS (AV) FISTULA CREATION;  Surgeon: Serafina Mitchell, MD;  Location: [place];  Service: Vascular;  Laterality: Right;   AV FISTULA PLACEMENT Right 09/15/2015   Procedure: ARTERIOVENOUS (AV) FISTULA CREATION- RIGHT ARM;  Surgeon: Serafina Mitchell, MD;  Location: [place];  Service: Vascular;  Laterality: Right;   CAPD INSERTION N/A 05/18/2014   Procedure: LAPAROSCOPIC  INSERTION CONTINUOUS AMBULATORY PERITONEAL DIALYSIS  (CAPD) CATHETER;  Surgeon: Ralene Ok, MD;  Location: [place];  Service: General;  Laterality: N/A;   COLONOSCOPY     COLONOSCOPY WITH PROPOFOL N/A 07/12/2016   Procedure: COLONOSCOPY WITH PROPOFOL;  Surgeon: [name], MD;  Location: [facility];  Service: Endoscopy;  Laterality: N/A;   CORONARY ARTERY BYPASS GRAFT N/A 10/05/2021   Procedure: CORONARY ARTERY BYPASS GRAFTING (CABG) X 1, ON PUMP, USING LEFT INTERNAL MAMMARY ARTERY CONDUIT;  Surgeon: [name], MD;  Location: [place];  Service: Open Heart Surgery;  Laterality: N/A;   declotting of fistula  08/2015   ESOPHAGOGASTRODUODENOSCOPY (EGD) WITH PROPOFOL N/A 07/12/2016   Procedure: ESOPHAGOGASTRODUODENOSCOPY (EGD) WITH PROPOFOL;  Surgeon: [name], MD;  [facility];  Service: Endoscopy;  Laterality: N/A;   FISTULA SUPERFICIALIZATION Right 02/07/2016   Procedure: RIIGHT UPPER ARM FISTULA SUPERFICIALIZATION;  Surgeon: Elam Dutch, MD;  Location: [place];  Service: Vascular;  Laterality: Right;  IJ catheter insertion     INSERTION OF DIALYSIS CATHETER Right 02/25/2015   Procedure: INSERTION OF RIGHT INTERNAL JUGULAR DIALYSIS CATHETER;  Surgeon: Serafina Mitchell, MD;  Location: [place];  Service: Vascular;  Laterality: Right;   KIDNEY TRANSPLANT     LAPAROSCOPIC REPOSITIONING CAPD CATHETER N/A 06/16/2014   Procedure: LAPAROSCOPIC REPOSITIONING CAPD CATHETER;  Surgeon: Ralene Ok, MD;  Location: [place];  Service: General;  Laterality: N/A;   LAPAROSCOPY N/A 05/04/2015   Procedure: LAPAROSCOPY DIAGNOSTIC LYSIS OF ADHESIONS;  Surgeon: Coralie Keens, MD;  Location: [place];  Service: General;  Laterality: N/A;   MINOR REMOVAL OF PERITONEAL DIALYSIS CATHETER N/A 04/28/2015   Procedure:  REMOVAL OF PERITONEAL DIALYSIS CATHETER;  Surgeon: Coralie Keens, MD;  Location: [place];  Service: General;  Laterality: N/A;   NEPHRECTOMY Left 1974   RENAL BIOPSY Right 2012    REVISON OF ARTERIOVENOUS FISTULA Right 05/26/2015   Procedure: SUPERFICIALIZATION OF ARTERIOVENOUS FISTULA WITH SIDE BRANCH LIGATIONS;  Surgeon: Serafina Mitchell, MD;  [facility];  Service: Vascular;  Laterality: Right;   RIGHT/LEFT HEART CATH AND CORONARY ANGIOGRAPHY N/A 08/22/2021   Procedure: RIGHT/LEFT HEART CATH AND CORONARY ANGIOGRAPHY;  Surgeon: Troy Sine, MD;  Location: [place] [facility];  Service: Cardiovascular;  Laterality: N/A;   SKIN CANCER EXCISION     right shoulder   SVT ABLATION N/A 11/23/2016   Procedure: SVT Ablation;  Surgeon: Will Meredith Leeds, MD;  Location: [place] [facility];  Service: Cardiovascular;  Laterality: N/A;   TEE WITHOUT CARDIOVERSION N/A 10/05/2021   Procedure: TRANSESOPHAGEAL ECHOCARDIOGRAM (TEE);  Surgeon: [name], MD;  Location: [facility];  Service: Open Heart Surgery;  Laterality: N/A;   UMBILICAL HERNIA REPAIR N/A 05/18/2014   Procedure: HERNIA REPAIR UMBILICAL ADULT;  Surgeon: Ralene Ok, MD;  Location: [place];  Service: General;  Laterality: N/A;   UMBILICAL HERNIA REPAIR N/A 04/28/2015   Procedure: UMBILICAL HERNIA REPAIR WITH MESH;  Surgeon: Coralie Keens, MD;  Location: [place];  Service: General;  Laterality: N/A;   Patient Active Problem List   Diagnosis Date Noted   TIA (transient ischemic attack) 05/01/2022   Pyelonephritis 04/30/2022   History of ESBL Klebsiella pneumoniae infection 04/30/2022   DNR (do not resuscitate)/DNI(Do Not Intubate) 04/30/2022   Mild vascular neurocognitive disorder 02/08/2022   Atrial fibrillation 11/29/2021   Acute cystitis without hematuria 11/09/2021   Hyponatremia 11/09/2021   Cerebrovascular accident (CVA) due to embolism of precerebral artery    History of COVID-19 2023   S/P AVR (aortic valve replacement) 10/05/2021   Exertional dyspnea    Combined arterial insufficiency and corporo-venous occlusive erectile dysfunction 12/28/2019   OAB (overactive bladder) 12/28/2019   Inguinal  hernia without obstruction or gangrene 06/30/2017   Tremor of both hands 06/29/2017   Hydrocele, acquired 04/07/2017   Scrotal edema 03/14/2017   Diabetic neuropathy 03/01/2017   Kidney transplanted 02/26/2017   Immunosuppressed status 02/26/2017   Diverticulosis of colon without hemorrhage    Morbid obesity ([place]) 02/03/2016   Type 2 diabetes mellitus with hyperglycemia, with long-term current use of insulin 07/14/2015   Chronic renal failure    Anemia in chronic kidney disease 03/30/2015   Benign essential hypertension 07/30/2014   Stage 3b chronic kidney disease (CKD) 03/22/2014   Fatigue 01/08/2014   History of unilateral nephrectomy 09/06/2013   SVT (supraventricular tachycardia) ([place]) 09/06/2013   Lower extremity edema 09/06/2013   Obstructive sleep apnea 09/06/2013   Hypothyroidism 09/06/2013   Hyperlipidemia 09/06/2013  ONSET DATE: 05/01/22  REFERRING DIAG: R42 (ICD-10-CM) - Dizziness and giddiness  THERAPY DIAG:  Dizziness and giddiness  Unsteadiness on feet  Other abnormalities of gait and mobility  Rationale for Evaluation and Treatment Rehabilitation  SUBJECTIVE:   SUBJECTIVE STATEMENT: On a new medication Zonisimide instead of Nortriptyline. Dizziness is still constant- "the floor is still spinning." Denies new falls. But still has some LBP from his last fall. Reports that he lost 5 lbs in the past week d/t poor appetite.  Pt accompanied by: self  PERTINENT HISTORY: a-fib, HTN (hx HTN urgency), CVA, CAD, diapetic neuropathy, ESRD, HLD, kidney transplant 2018, macular degeneration, mild vascular neurocognitive disorder, SVT, DMII, AV fistula, CABG 3235, embolic CVA subcortical L frontal lobe with [name] residual weakness.   PAIN:  Are you having pain? Yes: NPRS scale: 8/10 Pain location: low back Pain description: sore/pain Aggravating factors: activity/walking Relieving factors: unable to take medication for it Offered ice pack but  declines  PRECAUTIONS: Fall  PATIENT GOALS improve dizziness  OBJECTIVE:     TODAY'S TREATMENT: 07/03/22   M-CTSIB  Condition 1: Firm Surface, EO 30 Sec, Mild Sway  Condition 2: Firm Surface, EC 30 Sec, Moderate Sway *cues to maintain knees straight  Condition 3: Foam Surface, EO 30 Sec, Moderate Sway *cues to maintain knees straight  Condition 4: Foam Surface, EC 20 Sec, Moderate and Severe Sway      Activity Comments  1/2 turns to targets with [facility] Initially looking down at feet with 2/10 dizziness, able to progress to looking straight ahead with decreased speed and slightly increased dizziness   gait with SPC searching for cones with SPC CGA; c/o dizziness rose to 3/10 from baseline of 2/10  wide stance EC + head turns/nods 2x30" CGA; cues to reduce speed d/t sway; no dizziness     PATIENT EDUCATION: Education details: review and readministered HEP Person educated: Patient Education method: Explanation, Demonstration, Tactile cues, Verbal cues, and Handouts Education comprehension: verbalized understanding    HOME EXERCISE PROGRAM Last updated: 06/20/22 Access Code: [email or web address] URL: [email or web address] Date: 06/20/2022 Prepared by: [facility]  Program Notes perform in corner for safety  Exercises - Standing Near Stance in Corner  - 1 x daily - 5 x weekly - 2-3 sets - 30 sec hold - Romberg Stance with Eyes Closed  - 1 x daily - 5 x weekly - 2-3 sets - 30 sec hold - Standing Tandem Balance with Counter Support  - 1 x daily - 5 x weekly - 2 sets - 30 sec hold - Standing Toe Taps  - 1 x daily - 5 x weekly - 2 sets - 10 reps - Standing Gaze Stabilization with Head Rotation  - 1 x daily - 5 x weekly - 2-3 sets - 30 sec hold - Standing Gaze Stabilization with Head Nod  - 1 x daily - 5 x weekly - 2-3 sets - 30 sec hold - Brandt-Daroff Vestibular Exercise  - 1 x daily - 7 x weekly - 5-10 reps -Self-induced Head Impulse  activity 5x each side   Below measures were taken at time of initial evaluation unless otherwise specified:   Vitals: 142/78 mmHg 98%, 71bpm  DIAGNOSTIC FINDINGS:  05/01/22 head MRI: Mild-to-moderate chronic microvascular ischemic disease with a few scattered remote lacunar infarcts involving the left hemispheric cerebral white matter.  05/01/22 head MRA: Irregularity with possible 8 mm outpouching extending inferiorly from the left ICA terminus. While this finding is  favored to be artifactual, a possible aneurysm is difficult to exclude  05/01/22 neck MRA: Grossly patent carotid artery systems and vertebral arteries within the neck. No obvious hemodynamically significant stenosis.   COGNITION: Overall cognitive status: Within functional limits for tasks assessed   SENSATION: Reports limited sensation in B hands/feet  POSTURE: rounded shoulders, forward head, and frequently bending forward/readjusting in seat; slightly SOB which patient reports is normal for him  GAIT: Gait pattern:  Gait very slow and cautious with L toe out Assistive device utilized: Single point cane  PATIENT SURVEYS:  FOTO [phone]   VESTIBULAR ASSESSMENT   GENERAL OBSERVATION: patient is wearing progressive lenses which he wears most time      OCULOMOTOR EXAM:   Ocular Alignment: normal; L orbit slightly elevated    Ocular ROM: No Limitations   Spontaneous Nystagmus: absent   Gaze-Induced Nystagmus: absent   Smooth Pursuits: saccades and superiorly 1-2    Saccades: hypometric/undershoots and slow more evident vertically than horizontally    Convergence/Divergence: reports diplopia at any distance     VESTIBULAR - OCULAR REFLEX:    Slow VOR: Comment: very slow and guarded horizontal; slightly better vertical   VOR Cancellation: Normal; c/o slight increase in dizziness    Head-Impulse Test: HIT Right: positive HIT Left: covert positive        POSITIONAL TESTING:  Right Roll Test:  negative Left Roll Test: c/o increased dizziness; Duration: 10 sec Right Sidelying: negative except for c/o woozing upon sitting up Left Sidelying: negative except for c/o woozing upon sitting up    VESTIBULAR TREATMENT:  PATIENT EDUCATION: Education details: prognosis, POC, HEP; educated on using RW rather than SPC d/t frequency of falls with hx of head trauma  Person educated: Patient Education method: Explanation, Demonstration, Tactile cues, Verbal cues, and Handouts Education comprehension: verbalized understanding and returned demonstration   GOALS: Goals reviewed with patient? Yes  SHORT TERM GOALS: Target date: 06/06/2022  Patient to be independent with initial HEP. Baseline: HEP initiated Goal status:PARTIALLY MET-needs cues, but is performing at home 06/14/2022    LONG TERM GOALS: Target date: 07/11/2022  Patient to be independent with advanced HEP. Baseline: Not yet initiated  Goal status: IN PROGRESS  Patient to report 0/10 dizziness with standing vertical and horizontal VOR for 30 seconds. Baseline: Unable Goal status: IN PROGRESS  Patient will report 0/10 dizziness with bed mobility.  Baseline: Symptomatic  Goal status: IN PROGRESS  Patient to demonstrate mild-moderate sway with M-CTSIB condition with eyes closed/firm surface in order to improve safety in environments with dim lighting. Baseline: moderate-severe sway 07/03/22 Goal status: IN PROGRESS  Patient to score at least 43/56 on Berg in order to decrease risk of falls. Baseline: NT Goal status: IN PROGRESS    ASSESSMENT:  CLINICAL IMPRESSION: Patient arrived to session with report of being on a new medication and noting increased LBP from a fall last week. Denies recent falls. Balance testing revealed increased difficulty using vestibular system for balance. Able to initiate more challenging standing gaze stability and balance exercises today with intermittent brief sitting rest breaks d/t LBP.  Patient also reported only small increase in symptoms with these activities, however increased sway and imbalance as main limitation. Patient reported understanding of all edu provided and without complaints at end of session.    OBJECTIVE IMPAIRMENTS Abnormal gait, decreased balance, decreased endurance, decreased mobility, difficulty walking, dizziness, postural dysfunction, and pain.   ACTIVITY LIMITATIONS carrying, lifting, bending, sitting, standing, squatting, sleeping, stairs, transfers, bed mobility,  bathing, toileting, dressing, reach over head, hygiene/grooming, and locomotion level  PARTICIPATION LIMITATIONS: meal prep, cleaning, laundry, driving, shopping, community activity, and church  PERSONAL FACTORS Age, Fitness, Past/current experiences, Time since onset of injury/illness/exacerbation, and 3+ comorbidities: a-fib, HTN (hx HTN urgency), CVA, CAD, diapetic neuropathy, ESRD, HLD, kidney transplant 2018, macular degeneration, mild vascular neurocognitive disorder, SVT, DMII, AV fistula, CABG 5102, embolic CVA subcortical L frontal lobe with [name] residual weakness.  are also affecting patient's functional outcome.   REHAB POTENTIAL: Good  CLINICAL DECISION MAKING: Evolving/moderate complexity  EVALUATION COMPLEXITY: Moderate   PLAN: PT FREQUENCY: 2x/week  PT DURATION: 8 weeks  PLANNED INTERVENTIONS: Therapeutic exercises, Therapeutic activity, Neuromuscular re-education, Balance training, Gait training, Patient/Family education, Self Care, Joint mobilization, Stair training, Vestibular training, Canalith repositioning, Dry Needling, Electrical stimulation, Cryotherapy, Moist heat, Manual therapy, and Re-evaluation  PLAN FOR NEXT SESSION: Turns, progress VOR and habituation, compliant surfaces, ?convergence exercises?, general activity progression for habituation of dizziness symptoms   Janene Harvey, PT, DPT 07/03/22 12:39 PM  [facility] at  [facility] [address] [place], [place] [address] Phone # [phone] Fax # [phone]

## 2022-07-02 NOTE — Telephone Encounter (Signed)
Pt called in stating he is not tolerating the Nortryptiline. He would like to see if he can resume his duloxetne, tarazodone, or tramadol?  He also wants to know if something was found on his recent MRI?

## 2022-07-02 NOTE — Telephone Encounter (Signed)
Pt states that he is returning a call from Pre Op. Please advise

## 2022-07-03 ENCOUNTER — Encounter: Payer: 59 | Admitting: Physical Therapy

## 2022-07-03 ENCOUNTER — Encounter: Payer: Self-pay | Admitting: Physical Therapy

## 2022-07-03 ENCOUNTER — Ambulatory Visit: Payer: 59 | Admitting: Physical Therapy

## 2022-07-03 DIAGNOSIS — R2689 Other abnormalities of gait and mobility: Secondary | ICD-10-CM

## 2022-07-03 DIAGNOSIS — R42 Dizziness and giddiness: Secondary | ICD-10-CM

## 2022-07-03 DIAGNOSIS — R2681 Unsteadiness on feet: Secondary | ICD-10-CM

## 2022-07-03 NOTE — Telephone Encounter (Signed)
   Patient Name: Robert [name]  DOB: 1960/11/12 MRN: [id number]  Primary Cardiologist: Shelva Majestic, MD  Chart reviewed as part of pre-operative protocol coverage. Per pharmacy team recommendations and office protocols,  Robert [name] may hold Eliquis one day prior to planned procedure. Clearance received for pharmacy clearance only.   I will route this recommendation to the requesting party via Epic fax function and remove from pre-op pool.  Please call with questions.  Loel Dubonnet, NP 07/03/2022, 8:23 AM

## 2022-07-03 NOTE — Telephone Encounter (Signed)
I spoke with patient and he stated that he wasn't aware that [facility] had suggested he put off the Colonoscopy until 07/2023. He will reach out to that office and have them send clearance request if this needs to be done now.

## 2022-07-03 NOTE — Telephone Encounter (Signed)
Pt called that Dr Tomi Likens stated that As directed last week,  he was supposed to discontinue nortriptyline and start zonisamide.  Once he stops nortriptyline, he may resume the duloxetine, trazodone and tramadol but that will have to be filled by the provider that was prescribing those medications (DR jaffe wasn't prescribing those medications).  The last MRI of the blood vessels in the head, which was performed a month ago and not ordered by Dr Tomi Likens, showed nothing new compared to what was found when he had a stroke earlier this year

## 2022-07-05 ENCOUNTER — Encounter: Payer: 59 | Admitting: Physical Therapy

## 2022-07-05 NOTE — Therapy (Signed)
OUTPATIENT PHYSICAL THERAPY VESTIBULAR TREATMENT     Patient Name: Robert [name] MRN: [id number] DOB:05/15/1961, 61 y.o., male Today's Date: 07/06/2022  PCP: Haywood Pao, MD REFERRING PROVIDER: Geoffery Lyons, NP   PT End of Session - 07/06/22 0928     Visit Number 9    Number of Visits 17    Date for PT Re-Evaluation 07/11/22    Authorization Type U[employer]    PT Start Time 0846    PT Stop Time 0927    PT Time Calculation (min) 41 min    Equipment Utilized During Treatment Gait belt    Activity Tolerance Patient tolerated treatment well;Patient limited by pain    Behavior During Therapy [facility] for tasks assessed/performed                   Past Medical History:  Diagnosis Date   Acute cystitis without hematuria 11/09/2021   AKI (acute kidney injury) 08/08/2017   Elevated sCr to 2.5 (baseline ~1.7) noted at OSH - Subsequent transfer to [facility]  - Currently on IVMF in the setting of diarrhea and AKI - sCr at baseline at time of discharge   Anemia in chronic kidney disease 03/30/2015   Anticoagulated on Coumadin 11/09/2021   Aortic valve stenosis    Arthritis    Atrial fibrillation 11/29/2021   Benign essential hypertension 07/30/2014   prior to transplant pt took Metorpolol tartrate 84m qd on off HD days   Benign neoplasm of transverse colon    Cancer    skin ca right shoulder, plastic dsyplasia, pre-Ca polpys removed on Colonoscopy- 07/2014   Cerebrovascular accident (CVA) due to embolism of precerebral artery    Chronic renal failure    Colon polyps 07/23/2014   Tubular adenomas x 5   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   Diverticulosis of colon without hemorrhage    Eczema    ESRD (end stage renal disease)    hemodialysis  06/2014-02/2017, s/p living unrelated kidney transplant 02/26/17   Exertional dyspnea    Fatigue 01/08/2014   Fissure of skin 03/08/2020   GI bleed 07/26/2017   Headache    Heart murmur    aortic stenosis (moderate-severe 04/2021)   History of COVID-19 2023   History of unilateral nephrectomy 09/06/2013   Hydrocele, acquired 04/07/2017   Hyperlipidemia 09/06/2013   Hypertensive urgency 10/21/2021   Hyponatremia 11/09/2021   Hypothyroidism    Immunosuppressed status 02/26/2017   Induction agent: Solumedrol - Envarsus 336mdaily  - Home dose decreased from 6m33mo 3mg26m the setting of FK 15.7 while inpatient   - Previously on Prograf, though d/c'ed during previous admission; suspected this may be causing hallucinations  - Cellcept 1000mg64mH  - Prednisone 6mg d53my   Inguinal hernia without obstruction or gangrene 06/30/2017   Right inguinal wall defect appreciated by CT imaging [place] and on exam Dr. Rege tPaulla Fore Dr. Rege hPaulla Foreiscussed hernia repair with mesh at some point in near future  Last Assessment & Plan:  Formatting of this note might be different from the original. Right inguinal wall defect appreciated by CT imaging [place] and on exam Dr. Rege tPaulla Fore Dr. Rege hPaulla Foreiscussed hern   Kidney transplanted 02/26/2017   Date of Transplant = 02/26/17  ABO (Donor/Recipient) = compatible Blood type: O+/A+ DonorType: Living donor, unrelated Allograft type:Left kidney Donor anatomy: 2 arteries, 1 veins and single ureter  Pants-to-skirt anastomosis of dual renal arteries on the backtable. Donor Kidney Bx: Not applicable Allograft injury/complications: None Ne   Lower extremity edema 09/06/2013   Macular degeneration    Mild vascular neurocognitive disorder 02/08/2022   OAB (overactive bladder) 12/28/2019   Obstructive sleep apnea 09/06/2013   On Bi-PAP  Last Assessment & Plan:  Formatting of this note might be different from the original. - History of OSA, continue CPAP at night   Peritoneal  dialysis catheter in place 02/24/2015   RLQ abdominal pain 04/19/2020   S/P AVR (aortic valve replacement) 10/05/2021   SBO (small bowel obstruction) 05/2015   Scrotal edema 03/14/2017   Testicular ultrasound did show hydrocele  Will refer to urology regarding ongoign swelling, hydrocele.   Last Assessment & Plan:  Formatting of this note might be different from the original.   Testicular ultrasound did show hydrocele  Will refer to urology regarding ongoign swelling, hydrocele.   Shortness of breath    with exertion   Stage 3b chronic kidney disease (CKD) 03/22/2014   SVT (supraventricular tachycardia) 09/06/2013   Tremor of both hands 06/29/2017   Last Assessment & Plan:  Present since transplant, stable and unchanged Query from tacrolimus -Improved tremors noted and verbalized by pt - Prograf doses titrated per FK levels  Last Assessment & Plan:  Formatting of this note might be different from the original. Present since transplant, stable and unchanged Query from tacrolimus -Improved tremors noted and verbalized by pt - Prograf doses titr   Type 2 diabetes mellitus with hyperglycemia, with long-term current use of insulin 84/69/6295   Umbilical hernia    s/p repair 04/28/15   Past Surgical History:  Procedure Laterality Date   AORTIC VALVE REPLACEMENT N/A 10/05/2021   Procedure: AORTIC VALVE REPLACEMENT (AVR) WITH INSPIRIS RESILIA AORTIC VALVE SIZE 27MM;  Surgeon: [name], MD;  Location: [place];  Service: Open Heart Surgery;  Laterality: N/A;   AV FISTULA PLACEMENT Right 02/25/2015   Procedure: RIGHT ARTERIOVENOUS (AV) FISTULA CREATION;  Surgeon: Serafina Mitchell, MD;  Location: [place];  Service: Vascular;  Laterality: Right;   AV FISTULA PLACEMENT Right 09/15/2015   Procedure: ARTERIOVENOUS (AV) FISTULA CREATION- RIGHT ARM;  Surgeon: Serafina Mitchell, MD;  Location: [place];  Service: Vascular;  Laterality: Right;   CAPD INSERTION N/A 05/18/2014   Procedure: LAPAROSCOPIC INSERTION  CONTINUOUS AMBULATORY PERITONEAL DIALYSIS  (CAPD) CATHETER;  Surgeon: Ralene Ok, MD;  Location: [place];  Service: General;  Laterality: N/A;   COLONOSCOPY     COLONOSCOPY WITH PROPOFOL N/A 07/12/2016   Procedure: COLONOSCOPY WITH PROPOFOL;  Surgeon: [name], MD;  Location: [facility];  Service: Endoscopy;  Laterality: N/A;   CORONARY ARTERY BYPASS GRAFT N/A 10/05/2021   Procedure: CORONARY ARTERY BYPASS GRAFTING (CABG) X 1, ON PUMP, USING LEFT INTERNAL MAMMARY ARTERY CONDUIT;  Surgeon: [name], MD;  Location: [place];  Service: Open Heart Surgery;  Laterality: N/A;   declotting of fistula  08/2015   ESOPHAGOGASTRODUODENOSCOPY (EGD) WITH PROPOFOL N/A 07/12/2016   Procedure: ESOPHAGOGASTRODUODENOSCOPY (EGD) WITH PROPOFOL;  Surgeon: [name], MD;  Location: [facility];  Service: Endoscopy;  Laterality: N/A;   FISTULA SUPERFICIALIZATION Right 02/07/2016   Procedure: RIIGHT UPPER ARM FISTULA SUPERFICIALIZATION;  Surgeon: Elam Dutch, MD;  Location: [place];  Service: Vascular;  Laterality: Right;  IJ catheter insertion     INSERTION OF DIALYSIS CATHETER Right 02/25/2015   Procedure: INSERTION OF RIGHT INTERNAL JUGULAR DIALYSIS CATHETER;  Surgeon: Serafina Mitchell, MD;  Location: [place];  Service: Vascular;  Laterality: Right;   KIDNEY TRANSPLANT     LAPAROSCOPIC REPOSITIONING CAPD CATHETER N/A 06/16/2014   Procedure: LAPAROSCOPIC REPOSITIONING CAPD CATHETER;  Surgeon: Ralene Ok, MD;  Location: [place];  Service: General;  Laterality: N/A;   LAPAROSCOPY N/A 05/04/2015   Procedure: LAPAROSCOPY DIAGNOSTIC LYSIS OF ADHESIONS;  Surgeon: Coralie Keens, MD;  Location: [place];  Service: General;  Laterality: N/A;   MINOR REMOVAL OF PERITONEAL DIALYSIS CATHETER N/A 04/28/2015   Procedure:  REMOVAL OF PERITONEAL DIALYSIS CATHETER;  Surgeon: Coralie Keens, MD;  Location: [place];  Service: General;  Laterality: N/A;   NEPHRECTOMY Left 1974   RENAL BIOPSY Right 2012   REVISON OF  ARTERIOVENOUS FISTULA Right 05/26/2015   Procedure: SUPERFICIALIZATION OF ARTERIOVENOUS FISTULA WITH SIDE BRANCH LIGATIONS;  Surgeon: Serafina Mitchell, MD;  Location: [facility];  Service: Vascular;  Laterality: Right;   RIGHT/LEFT HEART CATH AND CORONARY ANGIOGRAPHY N/A 08/22/2021   Procedure: RIGHT/LEFT HEART CATH AND CORONARY ANGIOGRAPHY;  Surgeon: Troy Sine, MD;  Location: [place] [facility];  Service: Cardiovascular;  Laterality: N/A;   SKIN CANCER EXCISION     right shoulder   SVT ABLATION N/A 11/23/2016   Procedure: SVT Ablation;  Surgeon: Will Meredith Leeds, MD;  Location: [place] [facility];  Service: Cardiovascular;  Laterality: N/A;   TEE WITHOUT CARDIOVERSION N/A 10/05/2021   Procedure: TRANSESOPHAGEAL ECHOCARDIOGRAM (TEE);  Surgeon: [name], MD;  Location: [place];  Service: Open Heart Surgery;  Laterality: N/A;   UMBILICAL HERNIA REPAIR N/A 05/18/2014   Procedure: HERNIA REPAIR UMBILICAL ADULT;  Surgeon: Ralene Ok, MD;  Location: [place];  Service: General;  Laterality: N/A;   UMBILICAL HERNIA REPAIR N/A 04/28/2015   Procedure: UMBILICAL HERNIA REPAIR WITH MESH;  Surgeon: Coralie Keens, MD;  Location: [place];  Service: General;  Laterality: N/A;   Patient Active Problem List   Diagnosis Date Noted   TIA (transient ischemic attack) 05/01/2022   Pyelonephritis 04/30/2022   History of ESBL Klebsiella pneumoniae infection 04/30/2022   DNR (do not resuscitate)/DNI(Do Not Intubate) 04/30/2022   Mild vascular neurocognitive disorder 02/08/2022   Atrial fibrillation 11/29/2021   Acute cystitis without hematuria 11/09/2021   Hyponatremia 11/09/2021   Cerebrovascular accident (CVA) due to embolism of precerebral artery    History of COVID-19 2023   S/P AVR (aortic valve replacement) 10/05/2021   Exertional dyspnea    Combined arterial insufficiency and corporo-venous occlusive erectile dysfunction 12/28/2019   OAB (overactive bladder) 12/28/2019   Inguinal hernia without  obstruction or gangrene 06/30/2017   Tremor of both hands 06/29/2017   Hydrocele, acquired 04/07/2017   Scrotal edema 03/14/2017   Diabetic neuropathy 03/01/2017   Kidney transplanted 02/26/2017   Immunosuppressed status 02/26/2017   Diverticulosis of colon without hemorrhage    Morbid obesity ([place]) 02/03/2016   Type 2 diabetes mellitus with hyperglycemia, with long-term current use of insulin 07/14/2015   Chronic renal failure    Anemia in chronic kidney disease 03/30/2015   Benign essential hypertension 07/30/2014   Stage 3b chronic kidney disease (CKD) 03/22/2014   Fatigue 01/08/2014   History of unilateral nephrectomy 09/06/2013   SVT (supraventricular tachycardia) ([place]) 09/06/2013   Lower extremity edema 09/06/2013   Obstructive sleep apnea 09/06/2013   Hypothyroidism 09/06/2013   Hyperlipidemia 09/06/2013  ONSET DATE: 05/01/22  REFERRING DIAG: R42 (ICD-10-CM) - Dizziness and giddiness  THERAPY DIAG:  Dizziness and giddiness  Unsteadiness on feet  Other abnormalities of gait and mobility  Rationale for Evaluation and Treatment Rehabilitation  SUBJECTIVE:   SUBJECTIVE STATEMENT: "I feel like the new drug is finally kicking in." Reports that the floor is spinning, HA, and "feeling a little disconnected." Reports that his urologist believes he might be developing a UTI. Reports some SOB but denies chest pain. Reports benefit from therapy but unsure if he wishes to continue.  Pt accompanied by: self  PERTINENT HISTORY: a-fib, HTN (hx HTN urgency), CVA, CAD, diapetic neuropathy, ESRD, HLD, kidney transplant 2018, macular degeneration, mild vascular neurocognitive disorder, SVT, DMII, AV fistula, CABG 6962, embolic CVA subcortical L frontal lobe with [name] residual weakness.   PAIN:  Are you having pain? Yes: NPRS scale: 1/10 Pain location: low back Pain description: sore/pain Aggravating factors: activity/walking Relieving factors: unable to take medication for  it Offered ice pack but declines  Are you having pain? Yes: NPRS scale: 3/10 Pain location: R sided frontal headache Pain description: headache Aggravating factors: medication Relieving factors: close my eyes and shake my head back and forth   PRECAUTIONS: Fall  PATIENT GOALS improve dizziness  OBJECTIVE:     TODAY'S TREATMENT: 07/06/22 Activity Comments  Vitals at start of session 141/66mHg, 93% spO2, 63 bpm  L brandt daroff  C/o short duration of severe dizziness; min-mod A upon sitting; c/o LBP  R brandt daroff  C/o short duration of severe dizziness; min A upon sitting; c/o LBP  Side to side elbow prop EO 3x30" Very slow; c/o LBP but tolerable; cues to look straight ahead rather than down  STS EC 5x Chair on front; c/o LBP  STS on foam 5x Chair on front; c/o LBP; CGA  Seated D2 flexion to cone elevated on 4" step 5x each side Slow; cues to increase pace; c/o 4/10 dizziness and nausea  alt toe tap on cone R knee instability/poor balance ; c/o R knee pain     HOME EXERCISE PROGRAM Last updated: 07/06/22 Access Code: [email or web address] URL: [email or web address] Date: 07/06/2022 Prepared by: [facility] Program Notes perform in corner for safety  Exercises - Standing Near Stance in Corner  - 1 x daily - 5 x weekly - 2-3 sets - 30 sec hold - Romberg Stance with Eyes Closed  - 1 x daily - 5 x weekly - 2-3 sets - 30 sec hold - Standing Tandem Balance with Counter Support  - 1 x daily - 5 x weekly - 2 sets - 30 sec hold - Standing Toe Taps  - 1 x daily - 5 x weekly - 2 sets - 10 reps - Standing Gaze Stabilization with Head Rotation  - 1 x daily - 5 x weekly - 2-3 sets - 30 sec hold - Standing Gaze Stabilization with Head Nod  - 1 x daily - 5 x weekly - 2-3 sets - 30 sec hold - Sidebending to Elbow Short Sit  - 1 x daily - 5 x weekly - 2 sets - 5 reps   PATIENT EDUCATION: Education details: HEP update Person educated:  Patient Education method: Explanation, Demonstration, Tactile cues, Verbal cues, and Handouts Education comprehension: verbalized understanding and returned demonstration    Below measures were taken at time of initial evaluation unless otherwise specified:   Vitals: 142/78 mmHg 98%, 71bpm  DIAGNOSTIC FINDINGS:  05/01/22 head  MRI: Mild-to-moderate chronic microvascular ischemic disease with a few scattered remote lacunar infarcts involving the left hemispheric cerebral white matter.  05/01/22 head MRA: Irregularity with possible 8 mm outpouching extending inferiorly from the left ICA terminus. While this finding is favored to be artifactual, a possible aneurysm is difficult to exclude  05/01/22 neck MRA: Grossly patent carotid artery systems and vertebral arteries within the neck. No obvious hemodynamically significant stenosis.   COGNITION: Overall cognitive status: Within functional limits for tasks assessed   SENSATION: Reports limited sensation in B hands/feet  POSTURE: rounded shoulders, forward head, and frequently bending forward/readjusting in seat; slightly SOB which patient reports is normal for him  GAIT: Gait pattern:  Gait very slow and cautious with L toe out Assistive device utilized: Single point cane  PATIENT SURVEYS:  FOTO [phone]   VESTIBULAR ASSESSMENT   GENERAL OBSERVATION: patient is wearing progressive lenses which he wears most time      OCULOMOTOR EXAM:   Ocular Alignment: normal; L orbit slightly elevated    Ocular ROM: No Limitations   Spontaneous Nystagmus: absent   Gaze-Induced Nystagmus: absent   Smooth Pursuits: saccades and superiorly 1-2    Saccades: hypometric/undershoots and slow more evident vertically than horizontally    Convergence/Divergence: reports diplopia at any distance     VESTIBULAR - OCULAR REFLEX:    Slow VOR: Comment: very slow and guarded horizontal; slightly better vertical   VOR Cancellation: Normal; c/o slight  increase in dizziness    Head-Impulse Test: HIT Right: positive HIT Left: covert positive        POSITIONAL TESTING:  Right Roll Test: negative Left Roll Test: c/o increased dizziness; Duration: 10 sec Right Sidelying: negative except for c/o woozing upon sitting up Left Sidelying: negative except for c/o woozing upon sitting up    VESTIBULAR TREATMENT:  PATIENT EDUCATION: Education details: prognosis, POC, HEP; educated on using RW rather than SPC d/t frequency of falls with hx of head trauma  Person educated: Patient Education method: Explanation, Demonstration, Tactile cues, Verbal cues, and Handouts Education comprehension: verbalized understanding and returned demonstration   GOALS: Goals reviewed with patient? Yes  SHORT TERM GOALS: Target date: 06/06/2022  Patient to be independent with initial HEP. Baseline: HEP initiated Goal status:PARTIALLY MET-needs cues, but is performing at home 06/14/2022    LONG TERM GOALS: Target date: 07/11/2022  Patient to be independent with advanced HEP. Baseline: Not yet initiated  Goal status: IN PROGRESS  Patient to report 0/10 dizziness with standing vertical and horizontal VOR for 30 seconds. Baseline: Unable Goal status: IN PROGRESS  Patient will report 0/10 dizziness with bed mobility.  Baseline: Symptomatic  Goal status: IN PROGRESS  Patient to demonstrate mild-moderate sway with M-CTSIB condition with eyes closed/firm surface in order to improve safety in environments with dim lighting. Baseline: moderate-severe sway 07/03/22 Goal status: IN PROGRESS  Patient to score at least 43/56 on Berg in order to decrease risk of falls. Baseline: NT Goal status: IN PROGRESS    ASSESSMENT:  CLINICAL IMPRESSION: Patient arrived to session with slower gait speed compared to baseline, noting sensation of the floor spinning, HA, and "feeling a little disconnected." Reports some SOB but denies chest pain. Vitals WFL at start of  session, thus proceeded with session to tolerance. Patient noted LBP with brandt daroff, trialed modified version which still brought on pain but patient reported that this was better tolerated. STS transfers revealed fairly good stability but limited speed d/t LBP; patient noted no dizziness with these.  Sitting bending activities required cues for pacing and rest break in between sets d/t c/o dizziness and nausea. Patient tolerated session well despite multiple complaints. Symptoms no worse at end of session.    OBJECTIVE IMPAIRMENTS Abnormal gait, decreased balance, decreased endurance, decreased mobility, difficulty walking, dizziness, postural dysfunction, and pain.   ACTIVITY LIMITATIONS carrying, lifting, bending, sitting, standing, squatting, sleeping, stairs, transfers, bed mobility, bathing, toileting, dressing, reach over head, hygiene/grooming, and locomotion level  PARTICIPATION LIMITATIONS: meal prep, cleaning, laundry, driving, shopping, community activity, and church  PERSONAL FACTORS Age, Fitness, Past/current experiences, Time since onset of injury/illness/exacerbation, and 3+ comorbidities: a-fib, HTN (hx HTN urgency), CVA, CAD, diapetic neuropathy, ESRD, HLD, kidney transplant 2018, macular degeneration, mild vascular neurocognitive disorder, SVT, DMII, AV fistula, CABG 2158, embolic CVA subcortical L frontal lobe with [name] residual weakness.  are also affecting patient's functional outcome.   REHAB POTENTIAL: Good  CLINICAL DECISION MAKING: Evolving/moderate complexity  EVALUATION COMPLEXITY: Moderate   PLAN: PT FREQUENCY: 2x/week  PT DURATION: 8 weeks  PLANNED INTERVENTIONS: Therapeutic exercises, Therapeutic activity, Neuromuscular re-education, Balance training, Gait training, Patient/Family education, Self Care, Joint mobilization, Stair training, Vestibular training, Canalith repositioning, Dry Needling, Electrical stimulation, Cryotherapy, Moist heat, Manual therapy,  and Re-evaluation  PLAN FOR NEXT SESSION: PN; Turns, progress VOR and habituation, compliant surfaces, ?convergence exercises?, general activity progression for habituation of dizziness symptoms   Janene Harvey, PT, DPT 07/06/22 9:28 AM  [facility] at [facility] [address] [place], [place] [address] Phone # [phone] Fax # [phone]

## 2022-07-06 ENCOUNTER — Encounter: Payer: Self-pay | Admitting: Physical Therapy

## 2022-07-06 ENCOUNTER — Ambulatory Visit: Payer: 59 | Admitting: Physical Therapy

## 2022-07-06 ENCOUNTER — Other Ambulatory Visit: Payer: 59

## 2022-07-06 DIAGNOSIS — R2689 Other abnormalities of gait and mobility: Secondary | ICD-10-CM

## 2022-07-06 DIAGNOSIS — R42 Dizziness and giddiness: Secondary | ICD-10-CM | POA: Diagnosis not present

## 2022-07-06 DIAGNOSIS — R2681 Unsteadiness on feet: Secondary | ICD-10-CM

## 2022-07-10 ENCOUNTER — Ambulatory Visit: Payer: 59 | Attending: Family Medicine

## 2022-07-10 ENCOUNTER — Other Ambulatory Visit (INDEPENDENT_AMBULATORY_CARE_PROVIDER_SITE_OTHER): Payer: 59

## 2022-07-10 DIAGNOSIS — R2681 Unsteadiness on feet: Secondary | ICD-10-CM | POA: Diagnosis present

## 2022-07-10 DIAGNOSIS — E1165 Type 2 diabetes mellitus with hyperglycemia: Secondary | ICD-10-CM | POA: Diagnosis not present

## 2022-07-10 DIAGNOSIS — E039 Hypothyroidism, unspecified: Secondary | ICD-10-CM

## 2022-07-10 DIAGNOSIS — R2689 Other abnormalities of gait and mobility: Secondary | ICD-10-CM | POA: Diagnosis present

## 2022-07-10 DIAGNOSIS — R42 Dizziness and giddiness: Secondary | ICD-10-CM | POA: Diagnosis present

## 2022-07-10 DIAGNOSIS — E291 Testicular hypofunction: Secondary | ICD-10-CM

## 2022-07-10 DIAGNOSIS — Z794 Long term (current) use of insulin: Secondary | ICD-10-CM | POA: Diagnosis not present

## 2022-07-10 LAB — TSH: TSH: 3.33 u[IU]/mL (ref 0.35–5.50)

## 2022-07-10 LAB — HEMOGLOBIN A1C: Hgb A1c MFr Bld: 7.1 % — ABNORMAL HIGH (ref 4.6–6.5)

## 2022-07-10 LAB — TESTOSTERONE: Testosterone: 184.05 ng/dL — ABNORMAL LOW (ref 300.00–890.00)

## 2022-07-10 LAB — GLUCOSE, RANDOM: Glucose, Bld: 140 mg/dL — ABNORMAL HIGH (ref 70–99)

## 2022-07-10 NOTE — Therapy (Signed)
OUTPATIENT PHYSICAL THERAPY VESTIBULAR TREATMENT/Progress Note/Recertification     Patient Name: Robert [name] MRN: [id number] DOB:1961/09/09, 61 y.o., male Today's Date: 07/10/2022  PCP: Haywood Pao, MD REFERRING PROVIDER: Geoffery Lyons, NP  Progress Note Reporting Period 05/16/22 to 07/10/22  See note below for Objective Data and Assessment of Progress/Goals.       PT End of Session - 07/10/22 1016     Visit Number 10    Number of Visits 17    Date for PT Re-Evaluation 07/24/22   additional 2x/wk x 2 wks   Authorization Type [employer]    Progress Note Due on Visit 20    PT Start Time 1015    PT Stop Time 1100    PT Time Calculation (min) 45 min    Equipment Utilized During Treatment Gait belt    Activity Tolerance Patient tolerated treatment well;Patient limited by pain    Behavior During Therapy WFL for tasks assessed/performed                   Past Medical History:  Diagnosis Date   Acute cystitis without hematuria 11/09/2021   AKI (acute kidney injury) 08/08/2017   Elevated sCr to 2.5 (baseline ~1.7) noted at OSH - Subsequent transfer to [facility]  - Currently on IVMF in the setting of diarrhea and AKI - sCr at baseline at time of discharge   Anemia in chronic kidney disease 03/30/2015   Anticoagulated on Coumadin 11/09/2021   Aortic valve stenosis    Arthritis    Atrial fibrillation 11/29/2021   Benign essential hypertension 07/30/2014   prior to transplant pt took Metorpolol tartrate 69m qd on off HD days   Benign neoplasm of transverse colon    Cancer    skin ca right shoulder, plastic dsyplasia, pre-Ca polpys removed on Colonoscopy- 07/2014   Cerebrovascular accident (CVA) due to embolism of precerebral artery    Chronic renal failure    Colon polyps 07/23/2014   Tubular adenomas x 5   Combined arterial insufficiency and corporo-venous occlusive erectile dysfunction 12/28/2019   Constipation    Coronary artery disease    Diabetic neuropathy  03/01/2017   Difficult intubation    unsure of actual problem but it was during the January 28, 2015 procedure.   Diverticulosis of colon without hemorrhage    Eczema    ESRD (end stage renal disease)    hemodialysis 06/2014-02/2017, s/p living unrelated kidney transplant 02/26/17   Exertional dyspnea    Fatigue 01/08/2014   Fissure of skin 03/08/2020   GI bleed 07/26/2017   Headache    Heart murmur    aortic stenosis (moderate-severe 04/2021)   History of COVID-19 2023   History of unilateral nephrectomy 09/06/2013   Hydrocele, acquired 04/07/2017   Hyperlipidemia 09/06/2013   Hypertensive urgency 10/21/2021   Hyponatremia 11/09/2021   Hypothyroidism    Immunosuppressed status 02/26/2017   Induction agent: Solumedrol - Envarsus 367mdaily  - Home dose decreased from 5m32mo 3mg79m the setting of FK 15.7 while inpatient   - Previously on Prograf, though d/c'ed during previous admission; suspected this may be causing hallucinations  - Cellcept 1000mg78mH  - Prednisone 5mg d59my   Inguinal hernia without obstruction or gangrene 06/30/2017   Right inguinal wall defect appreciated by CT imaging [place] and on exam Dr. Rege tPaulla Fore Dr. Rege hPaulla Foreiscussed hernia repair with mesh at some point in near future  Last Assessment & Plan:  Formatting of this note  might be different from the original. Right inguinal wall defect appreciated by CT imaging [place] and on exam Dr. Paulla Fore today Dr. Paulla Fore has discussed hern   Kidney transplanted 02/26/2017   Date of Transplant = 02/26/17  ABO (Donor/Recipient) = compatible Blood type: O+/A+ DonorType: Living donor, unrelated Allograft type:Left kidney Donor anatomy: 2 arteries, 1 veins and single ureter  Pants-to-skirt anastomosis of dual renal arteries on the backtable. Donor Kidney Bx: Not applicable Allograft injury/complications: None Ne   Lower extremity edema 09/06/2013   Macular degeneration    Mild vascular neurocognitive disorder 02/08/2022   OAB  (overactive bladder) 12/28/2019   Obstructive sleep apnea 09/06/2013   On Bi-PAP  Last Assessment & Plan:  Formatting of this note might be different from the original. - History of OSA, continue CPAP at night   Peritoneal dialysis catheter in place 02/24/2015   RLQ abdominal pain 04/19/2020   S/P AVR (aortic valve replacement) 10/05/2021   SBO (small bowel obstruction) 05/2015   Scrotal edema 03/14/2017   Testicular ultrasound did show hydrocele  Will refer to urology regarding ongoign swelling, hydrocele.   Last Assessment & Plan:  Formatting of this note might be different from the original.   Testicular ultrasound did show hydrocele  Will refer to urology regarding ongoign swelling, hydrocele.   Shortness of breath    with exertion   Stage 3b chronic kidney disease (CKD) 03/22/2014   SVT (supraventricular tachycardia) 09/06/2013   Tremor of both hands 06/29/2017   Last Assessment & Plan:  Present since transplant, stable and unchanged Query from tacrolimus -Improved tremors noted and verbalized by pt - Prograf doses titrated per FK levels  Last Assessment & Plan:  Formatting of this note might be different from the original. Present since transplant, stable and unchanged Query from tacrolimus -Improved tremors noted and verbalized by pt - Prograf doses titr   Type 2 diabetes mellitus with hyperglycemia, with long-term current use of insulin 37/90/2409   Umbilical hernia    s/p repair 04/28/15   Past Surgical History:  Procedure Laterality Date   AORTIC VALVE REPLACEMENT N/A 10/05/2021   Procedure: AORTIC VALVE REPLACEMENT (AVR) WITH INSPIRIS RESILIA AORTIC VALVE SIZE 27MM;  Surgeon: [name], MD;  Location: [place];  Service: Open Heart Surgery;  Laterality: N/A;   AV FISTULA PLACEMENT Right 02/25/2015   Procedure: RIGHT ARTERIOVENOUS (AV) FISTULA CREATION;  Surgeon: Serafina Mitchell, MD;  Location: [place];  Service: Vascular;  Laterality: Right;   AV FISTULA PLACEMENT Right  09/15/2015   Procedure: ARTERIOVENOUS (AV) FISTULA CREATION- RIGHT ARM;  Surgeon: Serafina Mitchell, MD;  Location: [place];  Service: Vascular;  Laterality: Right;   CAPD INSERTION N/A 05/18/2014   Procedure: LAPAROSCOPIC INSERTION CONTINUOUS AMBULATORY PERITONEAL DIALYSIS  (CAPD) CATHETER;  Surgeon: Ralene Ok, MD;  Location: [place];  Service: General;  Laterality: N/A;   COLONOSCOPY     COLONOSCOPY WITH PROPOFOL N/A 07/12/2016   Procedure: COLONOSCOPY WITH PROPOFOL;  Surgeon: [name], MD;  Location: [facility];  Service: Endoscopy;  Laterality: N/A;   CORONARY ARTERY BYPASS GRAFT N/A 10/05/2021   Procedure: CORONARY ARTERY BYPASS GRAFTING (CABG) X 1, ON PUMP, USING LEFT INTERNAL MAMMARY ARTERY CONDUIT;  Surgeon: [name], MD;  Location: [place];  Service: Open Heart Surgery;  Laterality: N/A;   declotting of fistula  08/2015   ESOPHAGOGASTRODUODENOSCOPY (EGD) WITH PROPOFOL N/A 07/12/2016   Procedure: ESOPHAGOGASTRODUODENOSCOPY (EGD) WITH PROPOFOL;  Surgeon: [name], MD;  Location:  [facility];  Service: Endoscopy;  Laterality: N/A;   FISTULA SUPERFICIALIZATION Right 02/07/2016   Procedure: RIIGHT UPPER ARM FISTULA SUPERFICIALIZATION;  Surgeon: Elam Dutch, MD;  Location: [place];  Service: Vascular;  Laterality: Right;   IJ catheter insertion     INSERTION OF DIALYSIS CATHETER Right 02/25/2015   Procedure: INSERTION OF RIGHT INTERNAL JUGULAR DIALYSIS CATHETER;  Surgeon: Serafina Mitchell, MD;  Location: [name] [facility];  Service: Vascular;  Laterality: Right;   KIDNEY TRANSPLANT     LAPAROSCOPIC REPOSITIONING CAPD CATHETER N/A 06/16/2014   Procedure: LAPAROSCOPIC REPOSITIONING CAPD CATHETER;  Surgeon: Ralene Ok, MD;  Location: [place];  Service: General;  Laterality: N/A;   LAPAROSCOPY N/A 05/04/2015   Procedure: LAPAROSCOPY DIAGNOSTIC LYSIS OF ADHESIONS;  Surgeon: Coralie Keens, MD;  Location: [place];  Service: General;  Laterality: N/A;   MINOR REMOVAL OF PERITONEAL DIALYSIS  CATHETER N/A 04/28/2015   Procedure:  REMOVAL OF PERITONEAL DIALYSIS CATHETER;  Surgeon: Coralie Keens, MD;  Location: [place];  Service: General;  Laterality: N/A;   NEPHRECTOMY Left 1974   RENAL BIOPSY Right 2012   REVISON OF ARTERIOVENOUS FISTULA Right 05/26/2015   Procedure: SUPERFICIALIZATION OF ARTERIOVENOUS FISTULA WITH SIDE BRANCH LIGATIONS;  Surgeon: Serafina Mitchell, MD;  Location: [facility];  Service: Vascular;  Laterality: Right;   RIGHT/LEFT HEART CATH AND CORONARY ANGIOGRAPHY N/A 08/22/2021   Procedure: RIGHT/LEFT HEART CATH AND CORONARY ANGIOGRAPHY;  Surgeon: Troy Sine, MD;  Location: [place] [facility];  Service: Cardiovascular;  Laterality: N/A;   SKIN CANCER EXCISION     right shoulder   SVT ABLATION N/A 11/23/2016   Procedure: SVT Ablation;  Surgeon: Will Meredith Leeds, MD;  Location: [place] [facility];  Service: Cardiovascular;  Laterality: N/A;   TEE WITHOUT CARDIOVERSION N/A 10/05/2021   Procedure: TRANSESOPHAGEAL ECHOCARDIOGRAM (TEE);  Surgeon: [name], MD;  Location: [place];  Service: Open Heart Surgery;  Laterality: N/A;   UMBILICAL HERNIA REPAIR N/A 05/18/2014   Procedure: HERNIA REPAIR UMBILICAL ADULT;  Surgeon: Ralene Ok, MD;  Location: [place];  Service: General;  Laterality: N/A;   UMBILICAL HERNIA REPAIR N/A 04/28/2015   Procedure: UMBILICAL HERNIA REPAIR WITH MESH;  Surgeon: Coralie Keens, MD;  Location: [facility];  Service: General;  Laterality: N/A;   Patient Active Problem List   Diagnosis Date Noted   TIA (transient ischemic attack) 05/01/2022   Pyelonephritis 04/30/2022   History of ESBL Klebsiella pneumoniae infection 04/30/2022   DNR (do not resuscitate)/DNI(Do Not Intubate) 04/30/2022   Mild vascular neurocognitive disorder 02/08/2022   Atrial fibrillation 11/29/2021   Acute cystitis without hematuria 11/09/2021   Hyponatremia 11/09/2021   Cerebrovascular accident (CVA) due to embolism of precerebral artery    History of COVID-19 2023    S/P AVR (aortic valve replacement) 10/05/2021   Exertional dyspnea    Combined arterial insufficiency and corporo-venous occlusive erectile dysfunction 12/28/2019   OAB (overactive bladder) 12/28/2019   Inguinal hernia without obstruction or gangrene 06/30/2017   Tremor of both hands 06/29/2017   Hydrocele, acquired 04/07/2017   Scrotal edema 03/14/2017   Diabetic neuropathy 03/01/2017   Kidney transplanted 02/26/2017   Immunosuppressed status 02/26/2017   Diverticulosis of colon without hemorrhage    Morbid obesity ([place]) 02/03/2016   Type 2 diabetes mellitus with hyperglycemia, with long-term current use of insulin 07/14/2015   Chronic renal failure    Anemia in chronic kidney disease 03/30/2015   Benign essential hypertension 07/30/2014   Stage 3b chronic kidney disease (  CKD) 03/22/2014   Fatigue 01/08/2014   History of unilateral nephrectomy 09/06/2013   SVT (supraventricular tachycardia) 09/06/2013   Lower extremity edema 09/06/2013   Obstructive sleep apnea 09/06/2013   Hypothyroidism 09/06/2013   Hyperlipidemia 09/06/2013    ONSET DATE: 05/01/22  REFERRING DIAG: R42 (ICD-10-CM) - Dizziness and giddiness  THERAPY DIAG:  Dizziness and giddiness  Unsteadiness on feet  Other abnormalities of gait and mobility  Rationale for Evaluation and Treatment Rehabilitation  SUBJECTIVE:   SUBJECTIVE STATEMENT: Being treated for a UTI on 2nd day now. Also on different medication vs noritryptyline. Still experiencing floor spinning sensation. PNF with ball causes increased dizziness symptoms when looking up Pt accompanied by: self  PERTINENT HISTORY: a-fib, HTN (hx HTN urgency), CVA, CAD, diapetic neuropathy, ESRD, HLD, kidney transplant 2018, macular degeneration, mild vascular neurocognitive disorder, SVT, DMII, AV fistula, CABG 4540, embolic CVA subcortical L frontal lobe with [name] residual weakness.   PAIN:  Are you having pain? Yes: NPRS scale: 1/10 Pain location: low  back Pain description: sore/pain Aggravating factors: activity/walking Relieving factors: unable to take medication for it Offered ice pack but declines  Are you having pain? Yes: NPRS scale: 3/10 Pain location: R sided frontal headache Pain description: headache Aggravating factors: medication Relieving factors: close my eyes and shake my head back and forth   PRECAUTIONS: Fall  PATIENT GOALS improve dizziness  OBJECTIVE:    TODAY'S TREATMENT: 07/10/22 Activity Comments  Reports 30% improvement since start of care   Standing VOR x 1 x 30 sec Horizontal: 3/10 symptoms Vertical: 3/10 symptoms  Bed mobility 1/10 symptoms  M-CTSIB Condition 2: mild-mod  Berg Balance Test 48/56  Seated D2 flexion to yoga block  5x each side Notes worse symptoms with looking up w/ movement vs down  Corner balance activity review -standing VOR -Romberg EC -toe taps (right knee pain/instability limiting) -self Head Impulse drill     TODAY'S TREATMENT: 07/06/22 Activity Comments  Vitals at start of session 141/72mHg, 93% spO2, 63 bpm  L brandt daroff  C/o short duration of severe dizziness; min-mod A upon sitting; c/o LBP  R brandt daroff  C/o short duration of severe dizziness; min A upon sitting; c/o LBP  Side to side elbow prop EO 3x30" Very slow; c/o LBP but tolerable; cues to look straight ahead rather than down  STS EC 5x Chair on front; c/o LBP  STS on foam 5x Chair on front; c/o LBP; CGA  Seated D2 flexion to cone elevated on 4" step 5x each side Slow; cues to increase pace; c/o 4/10 dizziness and nausea  alt toe tap on cone R knee instability/poor balance ; c/o R knee pain     HOME EXERCISE PROGRAM Last updated: 07/06/22 Access Code: [email or web address]: [email or web address] Date: 07/06/2022 Prepared by: [facility] Program Notes perform in corner for safety  Exercises - Standing Near Stance in Corner  - 1 x daily - 5 x weekly -  2-3 sets - 30 sec hold - Romberg Stance with Eyes Closed  - 1 x daily - 5 x weekly - 2-3 sets - 30 sec hold - Standing Tandem Balance with Counter Support  - 1 x daily - 5 x weekly - 2 sets - 30 sec hold - Standing Toe Taps  - 1 x daily - 5 x weekly - 2 sets - 10 reps - Standing Gaze Stabilization with Head Rotation  - 1 x daily - 5 x weekly -  2-3 sets - 30 sec hold - Standing Gaze Stabilization with Head Nod  - 1 x daily - 5 x weekly - 2-3 sets - 30 sec hold - Sidebending to Elbow Short Sit  - 1 x daily - 5 x weekly - 2 sets - 5 reps   PATIENT EDUCATION: Education details: HEP update Person educated: Patient Education method: Explanation, Demonstration, Tactile cues, Verbal cues, and Handouts Education comprehension: verbalized understanding and returned demonstration    Below measures were taken at time of initial evaluation unless otherwise specified:   Vitals: 142/78 mmHg 98%, 71bpm  DIAGNOSTIC FINDINGS:  05/01/22 head MRI: Mild-to-moderate chronic microvascular ischemic disease with a few scattered remote lacunar infarcts involving the left hemispheric cerebral white matter.  05/01/22 head MRA: Irregularity with possible 8 mm outpouching extending inferiorly from the left ICA terminus. While this finding is favored to be artifactual, a possible aneurysm is difficult to exclude  05/01/22 neck MRA: Grossly patent carotid artery systems and vertebral arteries within the neck. No obvious hemodynamically significant stenosis.   COGNITION: Overall cognitive status: Within functional limits for tasks assessed   SENSATION: Reports limited sensation in B hands/feet  POSTURE: rounded shoulders, forward head, and frequently bending forward/readjusting in seat; slightly SOB which patient reports is normal for him  GAIT: Gait pattern:  Gait very slow and cautious with L toe out Assistive device utilized: Single point cane  PATIENT SURVEYS:  FOTO [phone]   VESTIBULAR  ASSESSMENT   GENERAL OBSERVATION: patient is wearing progressive lenses which he wears most time      OCULOMOTOR EXAM:   Ocular Alignment: normal; L orbit slightly elevated    Ocular ROM: No Limitations   Spontaneous Nystagmus: absent   Gaze-Induced Nystagmus: absent   Smooth Pursuits: saccades and superiorly 1-2    Saccades: hypometric/undershoots and slow more evident vertically than horizontally    Convergence/Divergence: reports diplopia at any distance     VESTIBULAR - OCULAR REFLEX:    Slow VOR: Comment: very slow and guarded horizontal; slightly better vertical   VOR Cancellation: Normal; c/o slight increase in dizziness    Head-Impulse Test: HIT Right: positive HIT Left: covert positive        POSITIONAL TESTING:  Right Roll Test: negative Left Roll Test: c/o increased dizziness; Duration: 10 sec Right Sidelying: negative except for c/o woozing upon sitting up Left Sidelying: negative except for c/o woozing upon sitting up    VESTIBULAR TREATMENT:  PATIENT EDUCATION: Education details: prognosis, POC, HEP; educated on using RW rather than SPC d/t frequency of falls with hx of head trauma  Person educated: Patient Education method: Explanation, Demonstration, Tactile cues, Verbal cues, and Handouts Education comprehension: verbalized understanding and returned demonstration   GOALS: Goals reviewed with patient? Yes  SHORT TERM GOALS: Target date: 06/06/2022  Patient to be independent with initial HEP. Baseline: HEP initiated Goal status:MET-needs cues, but is performing at home 06/14/2022; (07/10/22) indep    LONG TERM GOALS: Target date: 07/24/2022  Patient to be independent with advanced HEP. Baseline: Not yet initiated  Goal status: In development  Patient to report 0/10 dizziness with standing vertical and horizontal VOR for 30 seconds. Baseline: Unable; (07/10/22) 3/10 symptoms Goal status: In progress  Patient will report 0/10 dizziness with bed  mobility.  Baseline: Symptomatic; (07/10/22) reports 1/10 symptoms Goal status: In progress  Patient to demonstrate mild-moderate sway with M-CTSIB condition with eyes closed/firm surface in order to improve safety in environments with dim lighting. Baseline: moderate-severe sway 07/03/22; (07/10/22)  mild-mod Goal status: MET  Patient to score at least 43/56 on Berg in order to decrease risk of falls. Baseline: NT; 48/56 Goal status: MET    ASSESSMENT:  CLINICAL IMPRESSION: Patient reports feeling 30% improvement in symptoms and function since start of care and notes that the insulting positions/movements that were once problematic have become less disruptive, e.g. bed mobility and bending over.  Pt able to meet 1/1 STG and 2/5 LTG with others in progress.  Most notably demonstrates significant improvement in [employer] and able to manifest low risk for falls per score 48/56.  Continuing to progress with vestibular activities and demo improved performance and recall of VOR drills and developing more complex habituation maneuvers/movements to further reduce symptoms.  Will continued services to address remaining deficits and limitations and anticipate impending D/C to HEP thereafter.    OBJECTIVE IMPAIRMENTS Abnormal gait, decreased balance, decreased endurance, decreased mobility, difficulty walking, dizziness, postural dysfunction, and pain.   ACTIVITY LIMITATIONS carrying, lifting, bending, sitting, standing, squatting, sleeping, stairs, transfers, bed mobility, bathing, toileting, dressing, reach over head, hygiene/grooming, and locomotion level  PARTICIPATION LIMITATIONS: meal prep, cleaning, laundry, driving, shopping, community activity, and church  PERSONAL FACTORS Age, Fitness, Past/current experiences, Time since onset of injury/illness/exacerbation, and 3+ comorbidities: a-fib, HTN (hx HTN urgency), CVA, CAD, diapetic neuropathy, ESRD, HLD, kidney transplant 2018, macular  degeneration, mild vascular neurocognitive disorder, SVT, DMII, AV fistula, CABG 1594, embolic CVA subcortical L frontal lobe with [name] residual weakness.  are also affecting patient's functional outcome.   REHAB POTENTIAL: Good  CLINICAL DECISION MAKING: Evolving/moderate complexity  EVALUATION COMPLEXITY: Moderate   PLAN: PT FREQUENCY: 2x/week  PT DURATION: 8 weeks  PLANNED INTERVENTIONS: Therapeutic exercises, Therapeutic activity, Neuromuscular re-education, Balance training, Gait training, Patient/Family education, Self Care, Joint mobilization, Stair training, Vestibular training, Canalith repositioning, Dry Needling, Electrical stimulation, Cryotherapy, Moist heat, Manual therapy, and Re-evaluation  PLAN FOR NEXT SESSION: Further habituation (looking up movement provokes symptoms)  12:46 PM, 07/10/22 M. Sherlyn Lees, PT, DPT Physical Therapist- [place] Office Number: [phone]   [facility] [address], [place], [place] [address] Phone # [phone] Fax # [phone]

## 2022-07-11 ENCOUNTER — Encounter: Payer: Self-pay | Admitting: Endocrinology

## 2022-07-11 ENCOUNTER — Other Ambulatory Visit: Payer: Self-pay

## 2022-07-11 ENCOUNTER — Ambulatory Visit: Payer: 59 | Admitting: Endocrinology

## 2022-07-11 VITALS — BP 118/62 | HR 70 | Ht 71.0 in | Wt 275.0 lb

## 2022-07-11 DIAGNOSIS — E039 Hypothyroidism, unspecified: Secondary | ICD-10-CM

## 2022-07-11 DIAGNOSIS — E1165 Type 2 diabetes mellitus with hyperglycemia: Secondary | ICD-10-CM | POA: Diagnosis not present

## 2022-07-11 DIAGNOSIS — Z794 Long term (current) use of insulin: Secondary | ICD-10-CM | POA: Diagnosis not present

## 2022-07-11 DIAGNOSIS — E291 Testicular hypofunction: Secondary | ICD-10-CM | POA: Diagnosis not present

## 2022-07-11 MED ORDER — SYRINGE (DISPOSABLE) 1 ML MISC
0 refills | Status: AC
  Filled 2022-07-11: qty 25, fill #0

## 2022-07-11 MED ORDER — "BD PLASTIPAK SYRINGE 21G X 1"" 3 ML MISC"
1 refills | Status: DC
  Filled 2022-07-11: qty 100, 28d supply, fill #0
  Filled 2022-07-11: qty 50, fill #0
  Filled 2022-07-18: qty 100, 25d supply, fill #0

## 2022-07-11 MED ORDER — "BD HYPODERMIC NEEDLE 18G X 1"" MISC"
2.0000 | 1 refills | Status: AC
  Filled 2022-07-11: qty 100, 28d supply, fill #0
  Filled 2022-07-18: qty 100, 25d supply, fill #0

## 2022-07-11 NOTE — Progress Notes (Signed)
Patient ID: Robert [name], male   DOB: Feb 10, 1961, 61 y.o.   MRN: [id number]     Reason for Appointment: Endocrinology follow-up  History of Present Illness   DIABETES diagnosis date:  2012  Previous history: His blood sugar has been difficult to control since onset. Initially was taking oral hypoglycemic drugs like glyburide but could not take metformin because of renal dysfunction. Since Victoza did not help his control he was started on Lantus and then U 500 insulin also His A1c has previously ranged from 7.8-9.8, the lowest level in 03/2013 He was on Victoza but because of his markedly decreased appetite and weight loss this was stopped in 9/15  Recent history:   Non-hypoglycemic insulin regimen: was on Victoza 1.8 mg daily  His A1c is 7.1, improved Fructosamine previously 295  OMNIPOD pump settings with U-500 insulin:   Midnight = 0.15, 6 AM = 2.2, 11 AM = 1.4 and 5 PM = 0.20 Bolus settings: Insulin to carb ratio 1:15 Sensitivity or ISF 45 instead of 60  Carbohydrate ratio 1:10, correction factor I: 60  Bolus insulin with NovoLog FlexPen variable doses 10-15  Current blood sugar patterns, management and problems identified:  He has been continuing to use NovoLog for mealtime coverage However appears to be taking much less coverage at breakfast than needed or likely skipping his mealtime short in the morning for breakfast or earlier if with periodic spikes Overall however his blood sugars are very inconsistent in the afternoons and evenings  Although his OmniPod pump download indicates he is frequently getting a 0.05 basal throughout the day review of the history on the pump itself indicates he is getting about 18 units basal per day on most days  He says the last couple of days because of UTI he has very little appetite and blood sugars are generally normal Hypoglycemia has occurred at various times but generally more between noon and 2 AM  He says he has  been using a 1: 15 carbohydrate ratio but appears to be taking mostly 10 to 15 units boluses empirically Basal rates during the mornings were increased on the last visit   Proper timing of medications in relation to meals: Yes.          Blood sugar data analysis from freestyle libre version 3 for the last 2 weeks:  His blood sugars are showing very variable patterns from day-to-day He has episodes of significant hyperglycemia frequently before or around noon time for variable duration Also occasional hyperglycemic episodes have occurred following hypoglycemia Months of blood sugars in the last 2 days have been generally much lower especially on 10/2  HYPOGLYCEMIC episodes have been noted at all different times in the afternoons and evenings and early part of the night with no consistent pattern  Average blood sugar is within the target range but higher between about 11 AM and 2 PM when it is averaging about 180-190 Otherwise average blood sugar is mostly between 125-140 As above postprandial hyperglycemia is mostly seen after breakfast or early lunch  Overall time in range is however better than the previous visit More hypoglycemia seen compared to the last visit  CGM use % of time   2-week average/GV 144/43  Time in range      67%  Over 180/250 70/7     % Time Below 70 9   Previously:  CGM use % of time 97  Average 189/32  Time in range    42    %  %  Time Above 180 42  % Time above 250 14  % Time Below 70 2     PRE-MEAL Fasting Lunch Dinner Bedtime Overall  Glucose range:       Averages: 184       POST-MEAL PC Breakfast PC Lunch PC Dinner  Glucose range:     Averages: 229 192 186     Meals: 2-3 meals per day. 7-8 am and Noon and Dinner 5-7 PM .              Weight control:   Wt Readings from Last 3 Encounters:  07/11/22 275 lb (124.7 kg)  06/21/22 287 lb 8 oz (130.4 kg)  05/25/22 277 lb (154.0 kg)        Complications:   nephropathy, neuropathy   Diabetes  labs:   Lab Results  Component Value Date   HGBA1C 7.1 (H) 07/10/2022   HGBA1C 7.4 (H) 05/02/2022   HGBA1C 7.4 (H) 04/16/2022   Lab Results  Component Value Date   MICROALBUR 146.3 (H) 08/03/2019   LDLCALC 55 05/02/2022   CREATININE 2.16 (H) 05/01/2022    Lab Results  Component Value Date   FRUCTOSAMINE 295 (H) 11/28/2021   FRUCTOSAMINE 296 (H) 07/04/2021   FRUCTOSAMINE 276 12/13/2020   Lab Results  Component Value Date   HGB 11.8 (L) 05/02/2022       OTHER problems: See review of systems    Allergies as of 07/11/2022   No Known Allergies      Medication List        Accurate as of July 11, 2022  2:13 PM. If you have any questions, ask your nurse or doctor.          STOP taking these medications    BD Disp Needle 23G X 1" Misc Generic drug: NEEDLE (DISP) 23 G Replaced by: BD Plastipak Syringe 21G X 1" 3 ML Misc Stopped by: Elayne Snare, MD       TAKE these medications    acetaminophen 325 MG tablet Commonly known as: TYLENOL Take 2 tablets (650 mg total) by mouth every 4 (four) hours as needed for moderate pain.   amLODipine 5 MG tablet Commonly known as: NORVASC Take 1.5 tablets (7.5 mg total) by mouth daily.   apixaban 5 MG Tabs tablet Commonly known as: ELIQUIS Take 1 tablet (5 mg total) by mouth 2 (two) times daily. Stop coumadin, monitor the PT/INR, and start Eliquis when the INR is <2.   B-D UF III MINI PEN NEEDLES 31G X 5 MM Misc Generic drug: Insulin Pen Needle USE TO INJECT INSULIN 4 TIMES A DAY   BD Hypodermic Needle 18G X 1" Misc Generic drug: NEEDLE (DISP) 18 G USE AS DIRECTED TO INJECT 0.6MLS OF TESTOSTERONE EVERY 7 DAYS (DRAW UP) What changed: additional instructions Changed by: Elayne Snare, MD   BD Plastipak Syringe 21G X 1" 3 ML Misc Generic drug: SYRINGE-NEEDLE (DISP) 3 ML USE AS DIRECTED TO INJECT 0.6MLS OF TESTOSTERONE EVERY 7 DAYS (INJECT) Replaces: BD Disp Needle 23G X 1" Misc Started by: Elayne Snare, MD    carvedilol 25 MG tablet Commonly known as: COREG Take 1 tablet (25 mg total) by mouth 2 (two) times daily with a meal.   chlorthalidone 25 MG tablet Commonly known as: HYGROTON Take 1 tablet by mouth daily.   doxazosin 2 MG tablet Commonly known as: CARDURA Take 0.5 tablets (1 mg total) by mouth daily. Take 0.5 Tablet Daily   FreeStyle Libre 3 Sensor  Misc SMARTSIG:1 Topical Every 2 Weeks   gabapentin 100 MG capsule Commonly known as: NEURONTIN Take 200-300 mg by mouth See admin instructions. Taking 200 mg in the AM and 300 mg in the evening   HUMULIN R 500 UNIT/ML injection Generic drug: insulin regular human CONCENTRATED Inject 125 Units into the skin every 3 (three) days. Omnipod   hydrALAZINE 100 MG tablet Commonly known as: APRESOLINE TAKE 1 TABLET BY MOUTH EVERY 8 HOURS What changed:  when to take this additional instructions   insulin aspart 100 UNIT/ML injection Commonly known as: novoLOG Inject 5-20 Units into the skin daily as needed for high blood sugar.   levothyroxine 112 MCG tablet Commonly known as: SYNTHROID Take 1 tablet (112 mcg total) by mouth daily.   mycophenolate 250 MG capsule Commonly known as: CELLCEPT Take 1,000 mg by mouth every 12 (twelve) hours.   nitroGLYCERIN 0.4 MG SL tablet Commonly known as: NITROSTAT Place 0.4 mg under the tongue every 5 (five) minutes as needed for chest pain.   NON FORMULARY CPAP/BIPAP  at bedtime  BIPAP per pt   nortriptyline 10 MG capsule Commonly known as: PAMELOR TAKE 1 CAPSULE BY MOUTH AT BEDTIME.   Nulojix 250 MG Solr injection Generic drug: belatacept Inject 24.5 mLs into the vein every 28 (twenty-eight) days.   predniSONE 5 MG tablet Commonly known as: DELTASONE Take 5 mg by mouth daily with breakfast. Continuously   rosuvastatin 20 MG tablet Commonly known as: Crestor Take 1 tablet (20 mg total) by mouth daily.   senna-docusate 8.6-50 MG tablet Commonly known as: Senokot-S Take 1  tablet by mouth at bedtime as needed for mild constipation. What changed: when to take this   Syringe (Disposable) 1 ML Misc Use to draw up 0.37m of testosterone medication   tamsulosin 0.4 MG Caps capsule Commonly known as: FLOMAX Take 0.8 mg by mouth daily after supper.   Testosterone Cypionate 200 MG/ML Soln Inject 120 mg as directed once a week. Saturday   zolpidem 10 MG tablet Commonly known as: AMBIEN Take 10 mg by mouth at bedtime as needed for sleep.   zonisamide 25 MG capsule Commonly known as: ZONEGRAN '25mg'$  daily for one week, then '50mg'$  daily        Allergies: No Known Allergies  Past Medical History:  Diagnosis Date   Acute cystitis without hematuria 11/09/2021   AKI (acute kidney injury) 08/08/2017   Elevated sCr to 2.5 (baseline ~1.7) noted at OSH - [facility] - Currently on IVMF in the setting of diarrhea and AKI - sCr at baseline at time of discharge   Anemia in chronic kidney disease 03/30/2015   Anticoagulated on Coumadin 11/09/2021   Aortic valve stenosis    Arthritis    Atrial fibrillation 11/29/2021   Benign essential hypertension 07/30/2014   prior to transplant pt took Metorpolol tartrate '50mg'$  qd on off HD days   Benign neoplasm of transverse colon    Cancer    skin ca right shoulder, plastic dsyplasia, pre-Ca polpys removed on Colonoscopy- 07/2014   Cerebrovascular accident (CVA) due to embolism of precerebral artery    Chronic renal failure    Colon polyps 07/23/2014   Tubular adenomas x 5   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  Diverticulosis of colon without hemorrhage    Eczema    ESRD (end stage renal disease)    hemodialysis 06/2014-02/2017, s/p living unrelated kidney transplant 02/26/17    Exertional dyspnea    Fatigue 01/08/2014   Fissure of skin 03/08/2020   GI bleed 07/26/2017   Headache    Heart murmur    aortic stenosis (moderate-severe 04/2021)   History of COVID-19 2023   History of unilateral nephrectomy 09/06/2013   Hydrocele, acquired 04/07/2017   Hyperlipidemia 09/06/2013   Hypertensive urgency 10/21/2021   Hyponatremia 11/09/2021   Hypothyroidism    Immunosuppressed status 02/26/2017   Induction agent: Solumedrol - Envarsus '3mg'$  daily  - Home dose decreased from '5mg'$  to '3mg'$  in the setting of FK 15.7 while inpatient   - Previously on Prograf, though d/c'ed during previous admission; suspected this may be causing hallucinations  - Cellcept '1000mg'$  Q12H  - Prednisone '5mg'$  daily   Inguinal hernia without obstruction or gangrene 06/30/2017   Right inguinal wall defect appreciated by CT imaging [place] and on exam Dr. Paulla Fore today Dr. Paulla Fore has discussed hernia repair with mesh at some point in near future  Last Assessment & Plan:  Formatting of this note might be different from the original. Right inguinal wall defect appreciated by CT imaging [place] and on exam Dr. Paulla Fore today Dr. Paulla Fore has discussed hern   Kidney transplanted 02/26/2017   Date of Transplant = 02/26/17  ABO (Donor/Recipient) = compatible Blood type: O+/A+ DonorType: Living donor, unrelated Allograft type:Left kidney Donor anatomy: 2 arteries, 1 veins and single ureter  Pants-to-skirt anastomosis of dual renal arteries on the backtable. Donor Kidney Bx: Not applicable Allograft injury/complications: None Ne   Lower extremity edema 09/06/2013   Macular degeneration    Mild vascular neurocognitive disorder 02/08/2022   OAB (overactive bladder) 12/28/2019   Obstructive sleep apnea 09/06/2013   On Bi-PAP  Last Assessment & Plan:  Formatting of this note might be different from the original. - History of OSA, continue CPAP at night   Peritoneal dialysis catheter in place 02/24/2015   RLQ abdominal pain  04/19/2020   S/P AVR (aortic valve replacement) 10/05/2021   SBO (small bowel obstruction) 05/2015   Scrotal edema 03/14/2017   Testicular ultrasound did show hydrocele  Will refer to urology regarding ongoign swelling, hydrocele.   Last Assessment & Plan:  Formatting of this note might be different from the original.   Testicular ultrasound did show hydrocele  Will refer to urology regarding ongoign swelling, hydrocele.   Shortness of breath    with exertion   Stage 3b chronic kidney disease (CKD) 03/22/2014   SVT (supraventricular tachycardia) 09/06/2013   Tremor of both hands 06/29/2017   Last Assessment & Plan:  Present since transplant, stable and unchanged Query from tacrolimus -Improved tremors noted and verbalized by pt - Prograf doses titrated per FK levels  Last Assessment & Plan:  Formatting of this note might be different from the original. Present since transplant, stable and unchanged Query from tacrolimus -Improved tremors noted and verbalized by pt - Prograf doses titr   Type 2 diabetes mellitus with hyperglycemia, with long-term current use of insulin 71/03/2693   Umbilical hernia    s/p repair 04/28/15    Past Surgical History:  Procedure Laterality Date   AORTIC VALVE REPLACEMENT N/A 10/05/2021   Procedure: AORTIC VALVE REPLACEMENT (AVR) WITH INSPIRIS RESILIA AORTIC VALVE SIZE 27MM;  Surgeon: [name], MD;  Location: [place];  Service: Open Heart  Surgery;  Laterality: N/A;   AV FISTULA PLACEMENT Right 02/25/2015   Procedure: RIGHT ARTERIOVENOUS (AV) FISTULA CREATION;  Surgeon: Serafina Mitchell, MD;  Location: [place];  Service: Vascular;  Laterality: Right;   AV FISTULA PLACEMENT Right 09/15/2015   Procedure: ARTERIOVENOUS (AV) FISTULA CREATION- RIGHT ARM;  Surgeon: Serafina Mitchell, MD;  Location: [place];  Service: Vascular;  Laterality: Right;   CAPD INSERTION N/A 05/18/2014   Procedure: LAPAROSCOPIC INSERTION CONTINUOUS AMBULATORY PERITONEAL DIALYSIS  (CAPD) CATHETER;   Surgeon: Ralene Ok, MD;  Location: [place];  Service: General;  Laterality: N/A;   COLONOSCOPY     COLONOSCOPY WITH PROPOFOL N/A 07/12/2016   Procedure: COLONOSCOPY WITH PROPOFOL;  Surgeon: [name], MD;  Location: [facility];  Service: Endoscopy;  Laterality: N/A;   CORONARY ARTERY BYPASS GRAFT N/A 10/05/2021   Procedure: CORONARY ARTERY BYPASS GRAFTING (CABG) X 1, ON PUMP, USING LEFT INTERNAL MAMMARY ARTERY CONDUIT;  Surgeon: [name], MD;  Location: [place];  Service: Open Heart Surgery;  Laterality: N/A;   declotting of fistula  08/2015   ESOPHAGOGASTRODUODENOSCOPY (EGD) WITH PROPOFOL N/A 07/12/2016   Procedure: ESOPHAGOGASTRODUODENOSCOPY (EGD) WITH PROPOFOL;  Surgeon: [name], MD;  Location: [facility];  Service: Endoscopy;  Laterality: N/A;   FISTULA SUPERFICIALIZATION Right 02/07/2016   Procedure: RIIGHT UPPER ARM FISTULA SUPERFICIALIZATION;  Surgeon: Elam Dutch, MD;  Location: [place];  Service: Vascular;  Laterality: Right;   IJ catheter insertion     INSERTION OF DIALYSIS CATHETER Right 02/25/2015   Procedure: INSERTION OF RIGHT INTERNAL JUGULAR DIALYSIS CATHETER;  Surgeon: Serafina Mitchell, MD;  Location: [place] [facility];  Service: Vascular;  Laterality: Right;   KIDNEY TRANSPLANT     LAPAROSCOPIC REPOSITIONING CAPD CATHETER N/A 06/16/2014   Procedure: LAPAROSCOPIC REPOSITIONING CAPD CATHETER;  Surgeon: Ralene Ok, MD;  Location: [place];  Service: General;  Laterality: N/A;   LAPAROSCOPY N/A 05/04/2015   Procedure: LAPAROSCOPY DIAGNOSTIC LYSIS OF ADHESIONS;  Surgeon: Coralie Keens, MD;  Location: [place];  Service: General;  Laterality: N/A;   MINOR REMOVAL OF PERITONEAL DIALYSIS CATHETER N/A 04/28/2015   Procedure:  REMOVAL OF PERITONEAL DIALYSIS CATHETER;  Surgeon: Coralie Keens, MD;  Location: [place];  Service: General;  Laterality: N/A;   NEPHRECTOMY Left 1974   RENAL BIOPSY Right 2012   REVISON OF ARTERIOVENOUS FISTULA Right 05/26/2015   Procedure:  SUPERFICIALIZATION OF ARTERIOVENOUS FISTULA WITH SIDE BRANCH LIGATIONS;  Surgeon: Serafina Mitchell, MD;  Location: [facility];  Service: Vascular;  Laterality: Right;   RIGHT/LEFT HEART CATH AND CORONARY ANGIOGRAPHY N/A 08/22/2021   Procedure: RIGHT/LEFT HEART CATH AND CORONARY ANGIOGRAPHY;  Surgeon: Troy Sine, MD;  Location: [place] [facility];  Service: Cardiovascular;  Laterality: N/A;   SKIN CANCER EXCISION     right shoulder   SVT ABLATION N/A 11/23/2016   Procedure: SVT Ablation;  Surgeon: Will Meredith Leeds, MD;  Location: [place] [facility];  Service: Cardiovascular;  Laterality: N/A;   TEE WITHOUT CARDIOVERSION N/A 10/05/2021   Procedure: TRANSESOPHAGEAL ECHOCARDIOGRAM (TEE);  Surgeon: [name], MD;  Location: [place];  Service: Open Heart Surgery;  Laterality: N/A;   UMBILICAL HERNIA REPAIR N/A 05/18/2014   Procedure: HERNIA REPAIR UMBILICAL ADULT;  Surgeon: Ralene Ok, MD;  Location: [place];  Service: General;  Laterality: N/A;   UMBILICAL HERNIA REPAIR N/A 04/28/2015   Procedure: UMBILICAL HERNIA REPAIR WITH MESH;  Surgeon: Coralie Keens, MD;  Location: [place];  Service: General;  Laterality: N/A;  Family History  Problem Relation Age of Onset   Colon polyps Father    Stroke Father    Dementia Father        vascular   Colon cancer Neg Hx    Stomach cancer Neg Hx     Social History:  reports that he quit smoking about 9 years ago. His smoking use included cigars. He has never used smokeless tobacco. He reports that he does not currently use alcohol. He reports that he does not use drugs.  Review of Systems:  Hypertension:    His blood pressure has been managed by his nephrologist and transplant team monitors BP at home periodically  BP Readings from Last 3 Encounters:  07/11/22 118/62  06/21/22 (!) 140/78  05/25/22 134/82    Lipids:  His LDL is below 70 with 40 mg Crestor Has no evidence of vascular disease     Lab Results  Component Value Date    CHOL 116 05/02/2022   CHOL 109 10/13/2021   CHOL 148 07/04/2021   Lab Results  Component Value Date   HDL 52 05/02/2022   HDL 31 (L) 10/13/2021   HDL 69.70 07/04/2021   Lab Results  Component Value Date   LDLCALC 55 05/02/2022   LDLCALC 65 10/13/2021   LDLCALC 67 07/04/2021   Lab Results  Component Value Date   TRIG 45 05/02/2022   TRIG 66 10/13/2021   TRIG 54.0 07/04/2021   Lab Results  Component Value Date   CHOLHDL 2.2 05/02/2022   CHOLHDL 3.5 10/13/2021   CHOLHDL 2 07/04/2021   No results found for: "LDLDIRECT"   HYPOTHYROIDISM: He has had long-standing hypothyroidism  Has been taking his 100 g levothyroxine every morning However complaining of significant fatigue  He has taken his levothyroxine daily  Lab Results  Component Value Date   TSH 3.33 07/10/2022       HYPOGONADISM: 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  When testosterone level was low he was having fatigue and decreased motivation and because of his  he was prescribed He has been using AndroGel since February 2019 but subsequently stopped this because of cost  Current regimen is Depo testosterone 100 mg weekly since 11/2021 Again he says that he has difficulty getting refills for his syringes and needles and has not taken testosterone injections  Pretreatment free testosterone level low at 3.7     Lab Results  Component Value Date   TESTOSTERONE 184.05 (L) 07/10/2022   TESTOSTERONE 209.75 (L) 04/16/2022   TESTOSTERONE 641.61 11/28/2021   Lab Results  Component Value Date   HGB 11.8 (L) 05/02/2022    He has depression, taking 30 mg of Cymbalta; followed by PCP  Diabetic neuropathy: Has mild sensory loss on his last exam Followed by podiatrist    Examination:   BP 118/62   Pulse 70   Ht '5\' 11"'$  (1.803 m)   Wt 275 lb (124.7 kg)   SpO2 96%   BMI 38.35 kg/m   Body mass index is 38.35 kg/m.    ASSESSMENT/ PLAN:    Diabetes type 2 with  obesity, insulin-requiring:   See history of present illness for detailed discussion of his current management, problems identified and glycemic patterns on his freestyle libre sensor download  Has been on the OmniPod pump with U-500 recently  A1c is 7.4 Recent GMI 7.8  As above his blood sugar patterns are highly inconsistent Mealtime coverage for especially for breakfast is inadequate but occasionally  has unexpected low sugars also possibly from overestimating his boluses for dinner  Recommendations: Consider switching to NovoLog only for the pump, he wants to wait to the next visit Basal rate to be reduced at 11 AM down to 1.0 instead of 1.25 He will change his carb ratio to 1: 5 and try to be more consistent with estimating his carbohydrate coverage Call if having low sugars frequently  Hypogonadism: He again claims active pharmacy does not have the proper syringes and needles for the injection; new prescriptions for syringes and needles have been sent to the community pharmacy downstairs  HYPOTHYROIDISM: Continue same levothyroxine dose Follow-up labs on the next visit   Patient Instructions  Bolus settings: Insulin to carb ratio 1:5  Basal at 11 am 1.0     Elayne Snare 07/11/2022, 2:13 PM    Note: This office note was prepared with Dragon voice recognition system technology. Any transcriptional errors that result from this process are unintentional.

## 2022-07-11 NOTE — Patient Instructions (Addendum)
Bolus settings: Insulin to carb ratio 1:5  Basal at 11 am 1.0

## 2022-07-13 ENCOUNTER — Encounter: Payer: Self-pay | Admitting: Endocrinology

## 2022-07-13 ENCOUNTER — Encounter: Payer: 59 | Admitting: Gastroenterology

## 2022-07-18 ENCOUNTER — Ambulatory Visit: Payer: 59

## 2022-07-18 ENCOUNTER — Other Ambulatory Visit: Payer: Self-pay

## 2022-07-18 NOTE — Telephone Encounter (Signed)
Please see previous message

## 2022-07-20 ENCOUNTER — Ambulatory Visit: Payer: 59

## 2022-07-22 ENCOUNTER — Telehealth: Payer: Self-pay | Admitting: Nurse Practitioner

## 2022-07-22 ENCOUNTER — Other Ambulatory Visit: Payer: Self-pay | Admitting: Neurology

## 2022-07-22 NOTE — Telephone Encounter (Signed)
Patient with history of kidney transplant. He called answering service with complaints of a recent fecal impaction after taking "double strength" antibiotics. He manually disimpacted himself and subsequently passed a lot of blood from rectum. Since then he hasn't had another BM. He has taken a couple of Dulcolax tablets without relief but he is passing flatus. He has been having abdominal discomfort and wants to know what to do.   Recommended he try a dulcolax suppository. If doesn't help then can add a capsule of miralax in 8 oz water and take then up to 3 times a day until bowels are moving. If this doesn't help he can try a mineral enema  I told patient that I would ask Dr. Doyne Keel nurse to check on him in a day or two.

## 2022-07-23 NOTE — Telephone Encounter (Signed)
Thanks for your help with this Robert [name]. Hopefully he is doing better now.  [place] can you touch base with the patient in the next 1-2 days to see how he is doing? Thanks

## 2022-07-25 ENCOUNTER — Ambulatory Visit: Payer: 59

## 2022-07-25 DIAGNOSIS — R42 Dizziness and giddiness: Secondary | ICD-10-CM | POA: Diagnosis not present

## 2022-07-25 DIAGNOSIS — R2681 Unsteadiness on feet: Secondary | ICD-10-CM

## 2022-07-25 DIAGNOSIS — R2689 Other abnormalities of gait and mobility: Secondary | ICD-10-CM

## 2022-07-25 NOTE — Telephone Encounter (Signed)
Lm on vm for patient to call back to discuss how his symptoms are doing now.

## 2022-07-25 NOTE — Therapy (Signed)
OUTPATIENT PHYSICAL THERAPY VESTIBULAR TREATMENT and D/C [place]     Patient Name: Robert [name] MRN: [id number] DOB:1961-07-13, 61 y.o., male Today's Date: 07/25/2022  PCP: Haywood Pao, MD REFERRING PROVIDER: Geoffery Lyons, NP  PHYSICAL THERAPY DISCHARGE SUMMARY  Visits from Start of Care: 11  Current functional level related to goals / functional outcomes: Goals partially met   Remaining deficits: Continues to have vestibular symptoms and balance deficits   Education / Equipment: HEP   Patient agrees to discharge. Patient goals were partially met. Patient is being discharged due to lack of progress.    PT End of Session - 07/25/22 1313     Visit Number 11    Number of Visits 17    Date for PT Re-Evaluation 07/24/22   additional 2x/wk x 2 wks   Authorization Type [employer]    Progress Note Due on Visit 20    PT Start Time 1315    PT Stop Time 1400    PT Time Calculation (min) 45 min    Equipment Utilized During Treatment Gait belt    Activity Tolerance Patient tolerated treatment well;Patient limited by pain    Behavior During Therapy WFL for tasks assessed/performed                   Past Medical History:  Diagnosis Date   Acute cystitis without hematuria 11/09/2021   AKI (acute kidney injury) 08/08/2017   Elevated sCr to 2.5 (baseline ~1.7) noted at OSH - Subsequent transfer to [facility]  - Currently on IVMF in the setting of diarrhea and AKI - sCr at baseline at time of discharge   Anemia in chronic kidney disease 03/30/2015   Anticoagulated on Coumadin 11/09/2021   Aortic valve stenosis    Arthritis    Atrial fibrillation 11/29/2021   Benign essential hypertension 07/30/2014   prior to transplant pt took Metorpolol tartrate 50mg  qd on off HD days   Benign neoplasm of transverse colon    Cancer    skin ca right shoulder, plastic dsyplasia, pre-Ca polpys removed on Colonoscopy- 07/2014   Cerebrovascular accident (CVA) due to embolism of  precerebral artery    Chronic renal failure    Colon polyps 07/23/2014   Tubular adenomas x 5   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   Diverticulosis of colon without hemorrhage    Eczema    ESRD (end stage renal disease)    hemodialysis 06/2014-02/2017, s/p living unrelated kidney transplant 02/26/17   Exertional dyspnea    Fatigue 01/08/2014   Fissure of skin 03/08/2020   GI bleed 07/26/2017   Headache    Heart murmur    aortic stenosis (moderate-severe 04/2021)   History of COVID-19 2023   History of unilateral nephrectomy 09/06/2013   Hydrocele, acquired 04/07/2017   Hyperlipidemia 09/06/2013   Hypertensive urgency 10/21/2021   Hyponatremia 11/09/2021   Hypothyroidism    Immunosuppressed status 02/26/2017   Induction agent: Solumedrol - Envarsus 3mg  daily  - Home dose decreased from 5mg  to 3mg  in the setting of FK 15.7 while inpatient   - Previously on Prograf, though d/c'ed during previous admission; suspected this may be causing hallucinations  - Cellcept 1000mg  Q12H  - Prednisone 5mg  daily   Inguinal hernia without obstruction or gangrene  06/30/2017   Right inguinal wall defect appreciated by CT imaging [place] and on exam Dr. Paulla Fore today Dr. Paulla Fore has discussed hernia repair with mesh at some point in near future  Last Assessment & Plan:  Formatting of this note might be different from the original. Right inguinal wall defect appreciated by CT imaging [place] and on exam Dr. Paulla Fore today Dr. Paulla Fore has discussed hern   Kidney transplanted 02/26/2017   Date of Transplant = 02/26/17  ABO (Donor/Recipient) = compatible Blood type: O+/A+ DonorType: Living donor, unrelated Allograft type:Left kidney Donor anatomy: 2 arteries, 1 veins and single ureter   Pants-to-skirt anastomosis of dual renal arteries on the backtable. Donor Kidney Bx: Not applicable Allograft injury/complications: None Ne   Lower extremity edema 09/06/2013   Macular degeneration    Mild vascular neurocognitive disorder 02/08/2022   OAB (overactive bladder) 12/28/2019   Obstructive sleep apnea 09/06/2013   On Bi-PAP  Last Assessment & Plan:  Formatting of this note might be different from the original. - History of OSA, continue CPAP at night   Peritoneal dialysis catheter in place 02/24/2015   RLQ abdominal pain 04/19/2020   S/P AVR (aortic valve replacement) 10/05/2021   SBO (small bowel obstruction) 05/2015   Scrotal edema 03/14/2017   Testicular ultrasound did show hydrocele  Will refer to urology regarding ongoign swelling, hydrocele.   Last Assessment & Plan:  Formatting of this note might be different from the original.   Testicular ultrasound did show hydrocele  Will refer to urology regarding ongoign swelling, hydrocele.   Shortness of breath    with exertion   Stage 3b chronic kidney disease (CKD) 03/22/2014   SVT (supraventricular tachycardia) 09/06/2013   Tremor of both hands 06/29/2017   Last Assessment & Plan:  Present since transplant, stable and unchanged Query from tacrolimus -Improved tremors noted and verbalized by pt - Prograf doses titrated per FK levels  Last Assessment & Plan:  Formatting of this note might be different from the original. Present since transplant, stable and unchanged Query from tacrolimus -Improved tremors noted and verbalized by pt - Prograf doses titr   Type 2 diabetes mellitus with hyperglycemia, with long-term current use of insulin 81/44/8185   Umbilical hernia    s/p repair 04/28/15   Past Surgical History:  Procedure Laterality Date   AORTIC VALVE REPLACEMENT N/A 10/05/2021   Procedure: AORTIC VALVE REPLACEMENT (AVR) WITH INSPIRIS RESILIA AORTIC VALVE SIZE 27MM;  Surgeon: [name], MD;  Location: [place];  Service: Open  Heart Surgery;  Laterality: N/A;   AV FISTULA PLACEMENT Right 02/25/2015   Procedure: RIGHT ARTERIOVENOUS (AV) FISTULA CREATION;  Surgeon: Serafina Mitchell, MD;  Location: [place];  Service: Vascular;  Laterality: Right;   AV FISTULA PLACEMENT Right 09/15/2015   Procedure: ARTERIOVENOUS (AV) FISTULA CREATION- RIGHT ARM;  Surgeon: Serafina Mitchell, MD;  Location: [place];  Service: Vascular;  Laterality: Right;   CAPD INSERTION N/A 05/18/2014   Procedure: LAPAROSCOPIC INSERTION CONTINUOUS AMBULATORY PERITONEAL DIALYSIS  (CAPD) CATHETER;  Surgeon: Ralene Ok, MD;  Location: [place];  Service: General;  Laterality: N/A;   COLONOSCOPY     COLONOSCOPY WITH PROPOFOL N/A 07/12/2016   Procedure: COLONOSCOPY WITH PROPOFOL;  Surgeon: [name], MD;  [facility];  Service: Endoscopy;  Laterality: N/A;   CORONARY ARTERY BYPASS GRAFT N/A 10/05/2021   Procedure: CORONARY ARTERY BYPASS GRAFTING (CABG) X 1, ON PUMP, USING LEFT INTERNAL MAMMARY ARTERY CONDUIT;  Surgeon: [name],  MD;  [facility];  Service: Open Heart Surgery;  Laterality: N/A;   declotting of fistula  08/2015   ESOPHAGOGASTRODUODENOSCOPY (EGD) WITH PROPOFOL N/A 07/12/2016   Procedure: ESOPHAGOGASTRODUODENOSCOPY (EGD) WITH PROPOFOL;  Surgeon: [name], MD;  [facility];  Service: Endoscopy;  Laterality: N/A;   FISTULA SUPERFICIALIZATION Right 02/07/2016   Procedure: RIIGHT UPPER ARM FISTULA SUPERFICIALIZATION;  Surgeon: Elam Dutch, MD;  Location: [place];  Service: Vascular;  Laterality: Right;   IJ catheter insertion     INSERTION OF DIALYSIS CATHETER Right 02/25/2015   Procedure: INSERTION OF RIGHT INTERNAL JUGULAR DIALYSIS CATHETER;  Surgeon: Serafina Mitchell, MD;  Location: [place] [facility];  Service: Vascular;  Laterality: Right;   KIDNEY TRANSPLANT     LAPAROSCOPIC REPOSITIONING CAPD CATHETER N/A 06/16/2014   Procedure: LAPAROSCOPIC REPOSITIONING CAPD CATHETER;  Surgeon: Ralene Ok, MD;  Location: [place];   Service: General;  Laterality: N/A;   LAPAROSCOPY N/A 05/04/2015   Procedure: LAPAROSCOPY DIAGNOSTIC LYSIS OF ADHESIONS;  Surgeon: Coralie Keens, MD;  Location: [place];  Service: General;  Laterality: N/A;   MINOR REMOVAL OF PERITONEAL DIALYSIS CATHETER N/A 04/28/2015   Procedure:  REMOVAL OF PERITONEAL DIALYSIS CATHETER;  Surgeon: Coralie Keens, MD;  Location: [place];  Service: General;  Laterality: N/A;   NEPHRECTOMY Left 1974   RENAL BIOPSY Right 2012   REVISON OF ARTERIOVENOUS FISTULA Right 05/26/2015   Procedure: SUPERFICIALIZATION OF ARTERIOVENOUS FISTULA WITH SIDE BRANCH LIGATIONS;  Surgeon: Serafina Mitchell, MD;  [facility];  Service: Vascular;  Laterality: Right;   RIGHT/LEFT HEART CATH AND CORONARY ANGIOGRAPHY N/A 08/22/2021   Procedure: RIGHT/LEFT HEART CATH AND CORONARY ANGIOGRAPHY;  Surgeon: Troy Sine, MD;  Location: [place] [facility];  Service: Cardiovascular;  Laterality: N/A;   SKIN CANCER EXCISION     right shoulder   SVT ABLATION N/A 11/23/2016   Procedure: SVT Ablation;  Surgeon: Will Meredith Leeds, MD;  Location: [place] [facility];  Service: Cardiovascular;  Laterality: N/A;   TEE WITHOUT CARDIOVERSION N/A 10/05/2021   Procedure: TRANSESOPHAGEAL ECHOCARDIOGRAM (TEE);  Surgeon: [name], MD;  Location: [place];  Service: Open Heart Surgery;  Laterality: N/A;   UMBILICAL HERNIA REPAIR N/A 05/18/2014   Procedure: HERNIA REPAIR UMBILICAL ADULT;  Surgeon: Ralene Ok, MD;  Location: [place];  Service: General;  Laterality: N/A;   UMBILICAL HERNIA REPAIR N/A 04/28/2015   Procedure: UMBILICAL HERNIA REPAIR WITH MESH;  Surgeon: Coralie Keens, MD;  [facility];  Service: General;  Laterality: N/A;   Patient Active Problem List   Diagnosis Date Noted   TIA (transient ischemic attack) 05/01/2022   Pyelonephritis 04/30/2022   History of ESBL Klebsiella pneumoniae infection 04/30/2022   DNR (do not resuscitate)/DNI(Do Not Intubate) 04/30/2022   Mild  vascular neurocognitive disorder 02/08/2022   Atrial fibrillation 11/29/2021   Acute cystitis without hematuria 11/09/2021   Hyponatremia 11/09/2021   Cerebrovascular accident (CVA) due to embolism of precerebral artery    History of COVID-19 2023   S/P AVR (aortic valve replacement) 10/05/2021   Exertional dyspnea    Combined arterial insufficiency and corporo-venous occlusive erectile dysfunction 12/28/2019   OAB (overactive bladder) 12/28/2019   Inguinal hernia without obstruction or gangrene 06/30/2017   Tremor of both hands 06/29/2017   Hydrocele, acquired 04/07/2017   Scrotal edema 03/14/2017   Diabetic neuropathy 03/01/2017   Kidney transplanted 02/26/2017   Immunosuppressed status 02/26/2017   Diverticulosis of colon without hemorrhage    Morbid obesity ([place])  02/03/2016   Type 2 diabetes mellitus with hyperglycemia, with long-term current use of insulin 07/14/2015   Chronic renal failure    Anemia in chronic kidney disease 03/30/2015   Benign essential hypertension 07/30/2014   Stage 3b chronic kidney disease (CKD) 03/22/2014   Fatigue 01/08/2014   History of unilateral nephrectomy 09/06/2013   SVT (supraventricular tachycardia) 09/06/2013   Lower extremity edema 09/06/2013   Obstructive sleep apnea 09/06/2013   Hypothyroidism 09/06/2013   Hyperlipidemia 09/06/2013    ONSET DATE: 05/01/22  REFERRING DIAG: R42 (ICD-10-CM) - Dizziness and giddiness  THERAPY DIAG:  Dizziness and giddiness  Unsteadiness on feet  Other abnormalities of gait and mobility  Rationale for Evaluation and Treatment Rehabilitation  SUBJECTIVE:   SUBJECTIVE STATEMENT: "Been constipated past week". Had additional fall last week which injured left shoulder. Pt reports feeling an instance of his right eye feeling that it fell out of alignment with the left and was only resolved with lying down and moving eyes around with them being closed Pt accompanied by: self  PERTINENT HISTORY: a-fib,  HTN (hx HTN urgency), CVA, CAD, diapetic neuropathy, ESRD, HLD, kidney transplant 2018, macular degeneration, mild vascular neurocognitive disorder, SVT, DMII, AV fistula, CABG 4174, embolic CVA subcortical L frontal lobe with [name] residual weakness.   PAIN:  Are you having pain? Yes: NPRS scale: 3/10 Pain location: left shoulder  Pain description: sore/pain Aggravating factors: activity/walking Relieving factors: unable to take medication for it Offered ice pack but declines  Are you having pain? Yes: NPRS scale: 3/10 Pain location: R sided frontal headache Pain description: headache Aggravating factors: medication Relieving factors: close my eyes and shake my head back and forth   PRECAUTIONS: Fall  PATIENT GOALS improve dizziness  OBJECTIVE:   TODAY'S TREATMENT: 07/25/22 Activity Comments  Vestibular re-assessment See below                      OCULOMOTOR EXAM:   Ocular Alignment: left eye slighly elevated   Ocular ROM: No Limitations   Spontaneous Nystagmus: absent   Gaze-Induced Nystagmus: absent   Smooth Pursuits: intact and some saccadic intrusions with vertical vs horizontal   Saccades: slow   Convergence/Divergence: 9 cm     VESTIBULAR - OCULAR REFLEX:    Slow VOR: Normal and Comment: some intrusions with vertical   VOR Cancellation: Normal   Head-Impulse Test: HIT Right: positive--inconsistent HIT Left: positive--- intermittently positive, instances of very latent response/correction   Dynamic Visual Acuity: Not able to be assessed  TODAY'S TREATMENT: 07/10/22 Activity Comments  Reports 30% improvement since start of care   Standing VOR x 1 x 30 sec Horizontal: 3/10 symptoms Vertical: 3/10 symptoms  Bed mobility 1/10 symptoms  M-CTSIB Condition 2: mild-mod  Berg Balance Test 48/56  Seated D2 flexion to yoga block  5x each side Notes worse symptoms with looking up w/ movement vs down  Corner balance activity review -standing VOR -Romberg EC -toe  taps (right knee pain/instability limiting) -self Head Impulse drill     TODAY'S TREATMENT: 07/06/22 Activity Comments  Vitals at start of session 141/50mmHg, 93% spO2, 63 bpm  L brandt daroff  C/o short duration of severe dizziness; min-mod A upon sitting; c/o LBP  R brandt daroff  C/o short duration of severe dizziness; min A upon sitting; c/o LBP  Side to side elbow prop EO 3x30" Very slow; c/o LBP but tolerable; cues to look straight ahead rather than down  STS EC 5x Chair on front;  c/o LBP  STS on foam 5x Chair on front; c/o LBP; CGA  Seated D2 flexion to cone elevated on 4" step 5x each side Slow; cues to increase pace; c/o 4/10 dizziness and nausea  alt toe tap on cone R knee instability/poor balance ; c/o R knee pain     HOME EXERCISE PROGRAM Last updated: 07/06/22 Access Code: [email or web address] URL: [email or web address] Date: 07/06/2022 Prepared by: [facility]  Program Notes perform in corner for safety  Exercises - Standing Near Stance in Corner  - 1 x daily - 5 x weekly - 2-3 sets - 30 sec hold - Romberg Stance with Eyes Closed  - 1 x daily - 5 x weekly - 2-3 sets - 30 sec hold - Standing Tandem Balance with Counter Support  - 1 x daily - 5 x weekly - 2 sets - 30 sec hold - Standing Toe Taps  - 1 x daily - 5 x weekly - 2 sets - 10 reps - Standing Gaze Stabilization with Head Rotation  - 1 x daily - 5 x weekly - 2-3 sets - 30 sec hold - Standing Gaze Stabilization with Head Nod  - 1 x daily - 5 x weekly - 2-3 sets - 30 sec hold - Sidebending to Elbow Short Sit  - 1 x daily - 5 x weekly - 2 sets - 5 reps   PATIENT EDUCATION: Education details: HEP update Person educated: Patient Education method: Explanation, Demonstration, Tactile cues, Verbal cues, and Handouts Education comprehension: verbalized understanding and returned demonstration    Below measures were taken at time of initial evaluation unless otherwise  specified:   Vitals: 142/78 mmHg 98%, 71bpm  DIAGNOSTIC FINDINGS:  05/01/22 head MRI: Mild-to-moderate chronic microvascular ischemic disease with a few scattered remote lacunar infarcts involving the left hemispheric cerebral white matter.  05/01/22 head MRA: Irregularity with possible 8 mm outpouching extending inferiorly from the left ICA terminus. While this finding is favored to be artifactual, a possible aneurysm is difficult to exclude  05/01/22 neck MRA: Grossly patent carotid artery systems and vertebral arteries within the neck. No obvious hemodynamically significant stenosis.   COGNITION: Overall cognitive status: Within functional limits for tasks assessed   SENSATION: Reports limited sensation in B hands/feet  POSTURE: rounded shoulders, forward head, and frequently bending forward/readjusting in seat; slightly SOB which patient reports is normal for him  GAIT: Gait pattern:  Gait very slow and cautious with L toe out Assistive device utilized: Single point cane  PATIENT SURVEYS:  FOTO [phone]   VESTIBULAR ASSESSMENT   GENERAL OBSERVATION: patient is wearing progressive lenses which he wears most time      OCULOMOTOR EXAM:   Ocular Alignment: normal; L orbit slightly elevated    Ocular ROM: No Limitations   Spontaneous Nystagmus: absent   Gaze-Induced Nystagmus: absent   Smooth Pursuits: saccades and superiorly 1-2    Saccades: hypometric/undershoots and slow more evident vertically than horizontally    Convergence/Divergence: reports diplopia at any distance     VESTIBULAR - OCULAR REFLEX:    Slow VOR: Comment: very slow and guarded horizontal; slightly better vertical   VOR Cancellation: Normal; c/o slight increase in dizziness    Head-Impulse Test: HIT Right: positive HIT Left: covert positive        POSITIONAL TESTING:  Right Roll Test: negative Left Roll Test: c/o increased dizziness; Duration: 10 sec Right Sidelying: negative except for c/o  woozing upon sitting up Left Sidelying: negative  except for c/o woozing upon sitting up    VESTIBULAR TREATMENT:  PATIENT EDUCATION: Education details: prognosis, POC, HEP; educated on using RW rather than SPC d/t frequency of falls with hx of head trauma  Person educated: Patient Education method: Explanation, Demonstration, Tactile cues, Verbal cues, and Handouts Education comprehension: verbalized understanding and returned demonstration   GOALS: Goals reviewed with patient? Yes  SHORT TERM GOALS: Target date: 06/06/2022  Patient to be independent with initial HEP. Baseline: HEP initiated Goal status:MET-needs cues, but is performing at home 06/14/2022; (07/10/22) indep    LONG TERM GOALS: Target date: 07/24/2022  Patient to be independent with advanced HEP. Baseline: Not yet initiated  Goal status: MET  Patient to report 0/10 dizziness with standing vertical and horizontal VOR for 30 seconds. Baseline: Unable; (07/10/22) 3/10 symptoms Goal status: NOT MET  Patient will report 0/10 dizziness with bed mobility.  Baseline: Symptomatic; (07/10/22) reports 1/10 symptoms Goal status: NOT MET  Patient to demonstrate mild-moderate sway with M-CTSIB condition with eyes closed/firm surface in order to improve safety in environments with dim lighting. Baseline: moderate-severe sway 07/03/22; (07/10/22) mild-mod Goal status: MET  Patient to score at least 43/56 on Berg in order to decrease risk of falls. Baseline: NT; 48/56 Goal status: MET    ASSESSMENT:  CLINICAL IMPRESSION: Pt notes some improvement in his experience of floor spinning sensation since change of recent medication.  Notes continued instances of dizziness and imbalance with certain positions and activities such as stepping into the shower and with bending over to pick up items from ground.  Patient demonstrates issues with VOR assessment and inconsistent correction with Head Impulse Test.  Pt notes symptoms seem to  have plateau and notes that diplopia is not as omnipresent as it once was.  Recommend D/C from PT services at this time and f/u with MD as indicated.     OBJECTIVE IMPAIRMENTS Abnormal gait, decreased balance, decreased endurance, decreased mobility, difficulty walking, dizziness, postural dysfunction, and pain.   ACTIVITY LIMITATIONS carrying, lifting, bending, sitting, standing, squatting, sleeping, stairs, transfers, bed mobility, bathing, toileting, dressing, reach over head, hygiene/grooming, and locomotion level  PARTICIPATION LIMITATIONS: meal prep, cleaning, laundry, driving, shopping, community activity, and church  PERSONAL FACTORS Age, Fitness, Past/current experiences, Time since onset of injury/illness/exacerbation, and 3+ comorbidities: a-fib, HTN (hx HTN urgency), CVA, CAD, diapetic neuropathy, ESRD, HLD, kidney transplant 2018, macular degeneration, mild vascular neurocognitive disorder, SVT, DMII, AV fistula, CABG 2993, embolic CVA subcortical L frontal lobe with [name] residual weakness.  are also affecting patient's functional outcome.   REHAB POTENTIAL: Good  CLINICAL DECISION MAKING: Evolving/moderate complexity  EVALUATION COMPLEXITY: Moderate   PLAN: PT FREQUENCY: 2x/week  PT DURATION: 8 weeks  PLANNED INTERVENTIONS: Therapeutic exercises, Therapeutic activity, Neuromuscular re-education, Balance training, Gait training, Patient/Family education, Self Care, Joint mobilization, Stair training, Vestibular training, Canalith repositioning, Dry Needling, Electrical stimulation, Cryotherapy, Moist heat, Manual therapy, and Re-evaluation  PLAN FOR NEXT SESSION: D/C  1:14 PM, 07/25/22 M. Sherlyn Lees, PT, DPT Physical Therapist- [place] Office Number: [phone]   [place] [facility] [address], [place], [place] [address] Phone # [phone] Fax # [phone]

## 2022-07-26 ENCOUNTER — Ambulatory Visit: Payer: 59 | Attending: Cardiovascular Disease | Admitting: Cardiovascular Disease

## 2022-07-26 ENCOUNTER — Encounter: Payer: Self-pay | Admitting: Cardiovascular Disease

## 2022-07-26 VITALS — BP 126/72 | HR 69 | Ht 71.0 in | Wt 267.4 lb

## 2022-07-26 DIAGNOSIS — Z951 Presence of aortocoronary bypass graft: Secondary | ICD-10-CM | POA: Diagnosis not present

## 2022-07-26 DIAGNOSIS — G4733 Obstructive sleep apnea (adult) (pediatric): Secondary | ICD-10-CM | POA: Diagnosis not present

## 2022-07-26 DIAGNOSIS — I10 Essential (primary) hypertension: Secondary | ICD-10-CM

## 2022-07-26 DIAGNOSIS — Z952 Presence of prosthetic heart valve: Secondary | ICD-10-CM

## 2022-07-26 DIAGNOSIS — E118 Type 2 diabetes mellitus with unspecified complications: Secondary | ICD-10-CM

## 2022-07-26 DIAGNOSIS — E039 Hypothyroidism, unspecified: Secondary | ICD-10-CM

## 2022-07-26 DIAGNOSIS — E785 Hyperlipidemia, unspecified: Secondary | ICD-10-CM

## 2022-07-26 DIAGNOSIS — Z7901 Long term (current) use of anticoagulants: Secondary | ICD-10-CM

## 2022-07-26 DIAGNOSIS — Z940 Kidney transplant status: Secondary | ICD-10-CM

## 2022-07-26 DIAGNOSIS — Z794 Long term (current) use of insulin: Secondary | ICD-10-CM

## 2022-07-26 DIAGNOSIS — N1832 Chronic kidney disease, stage 3b: Secondary | ICD-10-CM

## 2022-07-26 NOTE — Progress Notes (Signed)
Patient ID: Robert [name], male   DOB: 1960-11-23, 61 y.o.   MRN: [id number]      HPI: Robert [name], is a 61 y.o. male who is a former patient of Dr. Terance [name].  He underwent AVR/CABG on October 05, 2021 and subsequent January 2023 hospitalization. I last saw him on March 15, 2022. He presents for f/u evaluatiuon.  Robert [name]  has a complex medical history that includes a congenital nonfunctioning left kidney for which he underwent left nephrectomy at age 35. He has a history of obesity, diabetes mellitus, and has been demonstrated to have proteinuria of his remaining single kidney, being status post open renal biopsy in [name] in November 2010. This revealed focal segmental glomerular sclerosis. At that time he was seen by Dr. Venita [name] in the nephrology department at [name]. He sees Dr. Moshe [name] in [name] for his kidney insufficiency and has been on ARB therapy in the past in an attempt to reduce proteinuria. He sees Dr. Dwyane [name] for his diabetes mellitus. He also has a history of hypothyroidism, hyperlipidemia, anxiety and depression. He has a history of prior supraventricular tachycardia requiring hospitalization in 2007 as was felt to be possibly AV nodal reentrant rhythm.  He has a history of complex sleep apnea initially diagnosed in 2007 with an AHI at that time of 56.7 per hour and during REM sleep this increased to 65.7 per hour. At that time he dropped his oxygen 84% and had loud snoring. He had been on BiPAP therapy. He underwent a 5 year followup split-night study in 2012 which again confirmed severe sleep apnea with an AHI of 60.7 on the baseline portion of the study. He was unable to achieve REM sleep at that time. Oxygen saturation during non-REM sleep at 83%. He was titrated utilizing BiPAP up to a setting of 16/12 cm water pressure. He does admit to continued residual daytime sleepiness despite using his BiPAP therapy.  An  echo Doppler study in August 2012 showed mild concentric LVH with normal systolic function and grade 1 diastolic dysfunction. He had trace mitral and tricuspid insufficiency as well as pulmonic insufficiency. A nuclear perfusion study in August 2012 showed normal perfusion.  Robert [name] was on dialysis Monday, [name].  He is on the [name].  In January 2017 he had an stress echo at [name] as part of his transplant evaluation.  I do not have the results of this evaluation.  He has had some difficulty with a steal syndrome in his right hand since he had AV fistulas inserted in the forearm and most recently now in the upper arm.  He continues to use BiPAP with 100% compliance.  He had a [name].  He obtains his CPAP supplies online because they are cheaper, but had seen [name].  He is using a Art therapist full face mask.  A download in the office from 05/28/2016 through 07/26/2016.  He is meeting compliance standards with usage at 85% usage stays greater than 4 hours at 77%.  He is averaging 6 hours and 43 minutes per night.  He is set at an IPAP pressure of 16 and EPAP pressure of 12.  His AHI was mildly increased at 6.5 with 5.2 per hour of apneic events and 1.3 of hypopnea events.  When I last saw him he had stopped taken Bystolic since his blood pressure had become low at the end of dialysis.  He  had been on 5 mg on nondialysis days.  He tells me his aspirin was discontinued because he was continuously having issues with bleeding following his dialysis when the catheter was removed.   He had an episode of SVT in December 2017, which was successfully aborted with vagal maneuvers.  In the emergency room, he had 2-3 more recurrences of SVT, of which, again, all aborted with vagal maneuvers.  He was started on verapamil and discharge from the hospital.  He has seen Dr. Curt [name] and had an EP study with attempted ablation, but apparently he was not able to  be induced and ablation was not performed.   He denies any recurrent episodes of SVT.  Remotely, he may of had some mild nonexertional musculoskeletal discomfort but has not experienced any of this recently.  Reportedly, his remote stress echo was negative for ischemia, although mild ST changes were noted in recovery.  He continues to be asymptomatic.  He underwent a kidney transplant from a living donor on 02/25/2017 at [name].  He tolerated surgery well but has developed a postoperative inguinal hernia, which will ultimately need to be surgically repaired.  He received a new BiPAP machine from [facility] 2 weeks ago.  He has a full face mask, but admits to increased leak.    Since I saw him in September 2018, he had 2 hospital stays at [name].  He developed intestinal bleeding due to diverticular disease and also had some dehydration issues.  He also underwent inguinal hernia surgery.  He denies any recent chest pain or palpitations.  He continues to use BiPAP with 100% compliance.  However, he did not have his machine with him for the days he was in the hospital.  He brought his machine with him today for interrogation.  Over the past month, usage hours and 6 hours and 32 minutes per night with an HI of 0.9.  He used to machine 27 of 30 nights nights that were not used was because he was in the hospital.  Usage greater than 4 hours was 26 of 30.  He is on a BiPAP pressure of 18/13 0.9.  Recently he has noticed a mild leak.  AHI was 5.6 over the past 7 days.  He has a ResMed fullface mask, Mirage Quatro mask.    When I saw him in November 2018, he was feeling well. He is seeing Dr. Azzie [name] at [name] from a nephrology standpoint.  He denies any episodes of chest pain or awareness of recurrent arrhythmia.  He admits to 100% use with CPAP.  [name] is his DME company.  He has a ResMed air 10 V auto unit with an EPAP pressure 14.  An IPAP pressure set at 18.  A download from 01/14/2017 through  02/12/2018 shows 100% compliance.  He is averaging 8 hours and 7 minutes of sleep.  AHI is excellent at 2.6.    I saw him in May 2019 and since that time, he has continued to follow at [facility]. He was last evaluated by me in a telemedicine visit in April 2020.  At that time he had noticed that over the past year  his blood pressure had increased and his lisinopril dose was increased to 20 mg and amlodipine reduced to 5 mg.  His weight had fluctuated and has increased from 293 to 303.  He has continued to use BiPAP with 100% compliance.  I was able to obtain a download today from March 23 through January 27, 2019.  He is 100% compliant with average BiPAP use of 7 hours and 10 minutes.  At his 18/14 pressure, AHI remains excellent at 2.6/h.  Over the past 6 months he has changed jobs twice.  He is been staying home particularly with his significant comorbidities which can be affected by the coronavirus including hypertension, diabetes mellitus, obesity, renal disease, and immunosuppression.  He walks approximately 2 times a day for short duration.  He receives immunosuppressant infusion with Nulojix and is scheduled for his next infusion: On Feb 12, 2019.  His last lipid panel was in October 2019 with total cholesterol 137 and LDL cholesterol 73.  He is scheduled to see his nephrologist in August.    I saw him in the office in February 2021 and since his  telemedicine evaluation in April 2020, he continued to be followed closely at [facility].  Creatinine had increased to 1.74.  He has noticed blood pressure elevation.  He has not been successful with weight loss.  He has been without anginal symptoms or awareness of recurrent SVT.  He has continued to use BiPAP therapy.  A download from 10/22/2019 through 11/20/1999 shows 93% of usage days.  Average use is 8 hours and 10 minutes.  At his pressure of 18/14, AHI is excellent at 1.5.  There is no mask leak.  During his February 2021 evaluation he had a 2/6 murmur consistent  with aortic stenosis and I recommended follow-up echo Doppler evaluation.   He underwent an echo Doppler study on December 08, 2019 which showed an EF of 65 to 70%, moderate left ventricular hypertrophy and grade 1 diastolic dysfunction.  There was mild mitral valve regurgitation.  There was moderate to severe aortic stenosis with a mean gradient of 39 peak gradient of 58.   He was evaluated in a telemedicine visit in June 2021.  At that time he denied any chest pain, presyncope or syncope.  He continues to go to [facility] for nephrology follow-up.  He states his blood pressure is stable every morning upon awakening but as a day progresses it may go up slightly.  He denies any exertional shortness of breath.  At times he has noticed some mild lower extremity edema.  He was evaluated at [facility] in July 2021 and was hospitalized overnight with right lower quadrant pain around his renal allograft.  A CT scan demonstrated normal-appearing transplant kidney without any acute abdominal/pelvic abnormalities however, significant stool burden was noted on CT suggesting moderate to severe constipation.  I last saw him in December 2021 at which time he denied any chest pain but had experienced some mild shortness of breath particularly with walking up steps.Marland Kitchen He is now on insulin pump.  He has noticed some lower extremity edema.  He continued to use BiPAP.  A download was obtained in the office today from November 7 through September 12, 2020 which shows excellent compliance.  There were only 2 days of nonuse when he never went to bed and it stayed in a recliner.  He is on BiPAP with a pressure of 18/14.  He is averaging 8 hours and 54 minutes of BiPAP use per night.  AHI is excellent at 2.0.  He does have mask leak and has been noticing a leak in the region of his chin.    I recommended he have a follow-up echo Doppler study which was done on November 16, 2020.  This showed continued hyperdynamic LV function with EF at 65 to 70%  and moderate LVH with grade 2 diastolic dysfunction.  RV size was normal.  Moderately elevated pulmonary artery systolic pressure at 45 mm.  There was mild mitral stenosis with a mean valve gradient of 3.2 mm with moderately severe mitral annular calcification.  There was moderate calcification of his trileaflet aortic valve.  He was felt to have moderate aortic stenosis with a mean gradient of 34.6 and peak gradient of 57 with calculated valve area 1.05 cm.  I saw him on December 01, 2020 at which time he continued to feel well but was experiencing some some leg edema and  shortness of breath with activity.  He is followed closely at [facility] for his renal transplant.  Most recent creatinine on October 27, 2020 was further increased at 1.89.  Hemoglobin A1c was 6.9. He denies palpitations.  He continues to use BiPAP.  I obtained a new download in the office today from January 25 through November 30, 2020.  Compliance is excellent with average use at 10 hours and 16 minutes per night.  At his BiPAP pressure of 18/14, AHI is 2.3.  There are nights with significant mask leak.  During that evaluation, I recommended that he undergo a follow-up echo Doppler study for reassessment of his aortic valve which may be progressing and it stenosis.  When I saw him on August 9,2022 he had recently noticed some slight increase shortness of breath with exertion.  He denied any presyncope or syncope.  However at times when his blood pressure gets too low he does note transient dizziness.  He admitted to swelling in his lower extremities right leg greater than left and has taken as needed furosemide but has not taken this recently.  He wass scheduled to undergo a renal transplant reevaluation in October at [facility].  He denied any chest pain.  He denies any palpitations.  He underwent an echo Doppler study on May 02, 2021.  EF remained stable at 60 to 65%.  There was moderate left ventricular hypertrophy and there was grade 2 diastolic  dysfunction.  There was moderate left atrial dilatation.  There was moderate calcification of a trileaflet aortic valve with moderately severe aortic stenosis.  The mean gradient had now increased to 44.4 and the peak instantaneous gradient to 64 mmHg.  The aortic valve area (VTI) was 0.81 cm.  There was mild dilation of the ascending aorta at 42 mm.  He continues to use BiPAP with 100% compliance.  A new download was obtained from July 10 through May 15, 2021 and average use is 8 hours and 15 minutes per night.  His BiPAP settings are 18/14 and AHI is excellent at 1.6/h.  During that evaluation, I discussed with him that with his progressive aortic valve stenosis he may ultimately require definitive cardiac catheterization and will need extensive CT imaging for potential candidacy for TAVR.  At that time I discussed with him the importance of being seen by his [facility] transplant team.  I also suggested he wear support stockings at least 20 to 30 mg and as needed to take furosemide 20 mg possibly daily or every other 2 to 3 days but depending upon his leg swelling.  I saw him on July 11, 2021 and since his prior evaluation he had contacted [facility] verbally since I saw him, he had apparently contacted [facility] verbally and was told that if catheterization was necessary he would need to undergo go adequate hydration.  Apparently, he subsequently developed a cough on September 24 through  25 that was productive with some purulent sputum.  He was seen by Dr. Osborne Casco.  A chest x-ray reportedly suggested bilateral pneumonia.  He tested negative for COVID and flu.  He was empirically started on cefdinir yesterday for a 10-day course of antibiotics.  He underwent follow-up laboratory on July 04, 2021.  His creatinine had risen to 1.93 on July 04, 2021 which had increased from 1.77 on March 23, 2021 and from 1.69 on December 13, 2020.  During that evaluation, I stressed to him that I wanted to be seen in person at [facility]  rather than a virtual visit.  He underwent follow-up laboratory at [facility] on July 18, 2021 and his creatinine now had increased to 2.2 with estimated GFR of 33 consistent with stage IIIb CKD, gradually progressing towards stage IV.  He was evaluated at the time by Adrienne Mocha, PA and his October 11 creatinine was not yet available  and it was felt that his creatinine was stable at 1.7.   I saw him on August 01, 2021 at which time he denied any chest tightness or pressure.  He does have fatigability and low stamina.  He is to undergo a monthly antirejection infusion with Nulojix at the [facility] infusion center and for that he will have a follow-up bmet.  I have also asked that he personally contact his [place] following this regarding his progressive increase in creatinine and potential risk associated with future catheterization if necessary.  He continues to use BiPAP and a download was obtained in the office  from September 25 through July 31, 2021.  Compliance is excellent with average use at 8 hours and 38 minutes.  At his 18/14 centimeters water pressure AHI is excellent at 1.8.  With stabilization of his renal function, I performed right and left heart catheterization on August 22, 2021.  He was found to have 80% ostial proximal LAD stenosis followed by 90% proximal to mid LAD stenosis.  The circumflex vessel had a large second marginal vessel with 50% stenosis in the RCA had 80% mid PDA stenosis with 80% lesion in the AV groove portion before few small posterolateral branches.  Right heart catheterization revealed PA pressure 41/11 with a mean right atrial pressure 4.  Mean aortic gradient was 40 mm with a peak to peak gradient at 42 mm and aortic valve area of 0.95 cm.  Following his cardiac catheterization, he was seen by Roby Lofts, PA in the office.  I scheduled him for surgical consultation with Dr. Thurnell Lose who saw him on September 11, 2021.  He was felt to have  stage D, severe, symptomatic aortic stenosis with New York Heart Association class III symptoms with shortness of breath consistent with chronic diastolic congestive heart failure.  With his severe three-vessel CAD and aortic stenosis he concurred that aortic valve replacement using a bioprosthetic valve and coronary artery bypass graft surgery will be his best treatment options.  He felt the symptoms are most likely related to the combination of severe aortic stenosis and significant CAD.  He was given tentative dates to schedule surgery after Christmas in late December and in early January.    He underwent successful aortic valve replacement using a 27 mm Edwards Inspiris Resilia pericardial valve and CABG x1 with a LIMA graft placed to his LAD by Dr. Cyndia Bent on October 05, 2021.  He was on Coumadin given postoperative atrial fibrillation and possible embolic stroke.  He was rehospitalized in January 2023 with dizziness,  nausea and elevated blood pressure increasing to 204/119.  Medications were adjusted.  He was readmitted and treated for acute pyelonephritis in early February 2023.  At that time lisinopril was discontinued.  He was evaluated by Juanda Crumble, PA-C on December 28, 2021 at which time he denied chest pain, shortness of breath PND orthopnea and was not having any palpitations since his December discharge.  I last saw him on March 12, 2022.  He was back at work virtually.  At times he was experiencing occasional double vision.  He denied any ps recurrent chest pain, presyncope or syncope.  He is on Eliquis for anticoagulation and is on carvedilol 25 mg twice a day, doxazosin 1 mg daily, hydralazine 100 mg every 8 hours, HCTZ 12.5 mg, in addition to his insulin, levothyroxine, and he continues to take prednisone 5 mg and rosuvastatin 20 mg in addition to Cymbalta 60 mg daily.  He continues to use BiPAP with 100% compliance.  He was to undergo neurology follow-up as well as endocrinology  follow-up.  Since I saw him, he was evaluated by Dr. Royann Shivers on March 15, 2022 after he had experienced chest pain that occurred during intense phone conversation/argument with human resources concerning his ability to continue working from home.  He was felt to have angina resulting from the extreme elevation of his heart rate or blood pressure related to emotional stress.  His ECG did not show any acute change.  He was fully anticoagulated on Eliquis.  He was given sublingual nitroglycerin renewal  He was subsequently evaluated by Laural Golden, Pharm.D. for reassessment of blood pressure on April 19, 2022 and blood pressure was stable.  It was recommended that he discontinue hydrochlorothiazide since apparently he was also taking as needed furosemide.  It was also recommended that he discontinue doxazosin.  He was last seen in the office by Edd Fabian, NP on May 25, 2022 with complaints of dizziness.  Prior to that he had seen Dr. Everlena Cooper of neurology and had been started on nortriptyline 10 mg at bedtime and duloxetine and trazodone were discontinued.  During his evaluation on August 18, blood pressure was labile and amlodipine was increased to 7.5 mg from 5 mg.  Presently, Robert [name] admits to significant fatigue.  He is not sleeping well.  He may not be allowed to continue working virtually.  He is now more febrile walking with a cane and will try to investigate potential disability.  He continues to be followed at [facility] for his renal transplant and CKD with most recent creatinine 2.16 on May 01, 2022.  He he continues to be on amlodipine 7.5 mg daily, carvedilol 25 mg twice a day, chlorthalidone 25 mg daily, hydralazine 100 mg twice a day, for blood pressure control.  He is on Humulin insulin.  He is on levothyroxine 112 mcg for hypothyroidism.  He continues to be anticoagulated on Eliquis 5 mg twice a day and he continues to be 8 on Nulojix and prednisone following his transplant.  He continues to  use BiPAP therapy and his current settings are 18 over 14 cm of water.  A download from September 19 through July 25, 2022 shows AHI at 7.1.  There were 2 days of no use when he felt like he needed to sleep in a recliner rather than in bed.  He presents for reevaluation.   Past Medical History:  Diagnosis Date   Acute cystitis without hematuria 11/09/2021   AKI (acute kidney injury) 08/08/2017  Elevated sCr to 2.5 (baseline ~1.7) noted at OSH - Subsequent transfer to [facility]  - Currently on IVMF in the setting of diarrhea and AKI - sCr at baseline at time of discharge   Anemia in chronic kidney disease 03/30/2015   Anticoagulated on Coumadin 11/09/2021   Aortic valve stenosis    Arthritis    Atrial fibrillation 11/29/2021   Benign essential hypertension 07/30/2014   prior to transplant pt took Metorpolol tartrate 50mg  qd on off HD days   Benign neoplasm of transverse colon    Cancer    skin ca right shoulder, plastic dsyplasia, pre-Ca polpys removed on Colonoscopy- 07/2014   Cerebrovascular accident (CVA) due to embolism of precerebral artery    Chronic renal failure    Colon polyps 07/23/2014   Tubular adenomas x 5   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   Diverticulosis of colon without hemorrhage    Eczema    ESRD (end stage renal disease)    hemodialysis 06/2014-02/2017, s/p living unrelated kidney transplant 02/26/17   Exertional dyspnea    Fatigue 01/08/2014   Fissure of skin 03/08/2020   GI bleed 07/26/2017   Headache    Heart murmur    aortic stenosis (moderate-severe 04/2021)   History of COVID-19 2023   History of unilateral nephrectomy 09/06/2013   Hydrocele, acquired 04/07/2017   Hyperlipidemia 09/06/2013   Hypertensive urgency 10/21/2021    Hyponatremia 11/09/2021   Hypothyroidism    Immunosuppressed status 02/26/2017   Induction agent: Solumedrol - Envarsus 3mg  daily  - Home dose decreased from 5mg  to 3mg  in the setting of FK 15.7 while inpatient   - Previously on Prograf, though d/c'ed during previous admission; suspected this may be causing hallucinations  - Cellcept 1000mg  Q12H  - Prednisone 5mg  daily   Inguinal hernia without obstruction or gangrene 06/30/2017   Right inguinal wall defect appreciated by CT imaging [place] and on exam Dr. Paulla Fore today Dr. Paulla Fore has discussed hernia repair with mesh at some point in near future  Last Assessment & Plan:  Formatting of this note might be different from the original. Right inguinal wall defect appreciated by CT imaging [place] and on exam Dr. Paulla Fore today Dr. Paulla Fore has discussed hern   Kidney transplanted 02/26/2017   Date of Transplant = 02/26/17  ABO (Donor/Recipient) = compatible Blood type: O+/A+ DonorType: Living donor, unrelated Allograft type:Left kidney Donor anatomy: 2 arteries, 1 veins and single ureter  Pants-to-skirt anastomosis of dual renal arteries on the backtable. Donor Kidney Bx: Not applicable Allograft injury/complications: None Ne   Lower extremity edema 09/06/2013   Macular degeneration    Mild vascular neurocognitive disorder 02/08/2022   OAB (overactive bladder) 12/28/2019   Obstructive sleep apnea 09/06/2013   On Bi-PAP  Last Assessment & Plan:  Formatting of this note might be different from the original. - History of OSA, continue CPAP at night   Peritoneal dialysis catheter in place 02/24/2015   RLQ abdominal pain 04/19/2020   S/P AVR (aortic valve replacement) 10/05/2021   SBO (small bowel obstruction) 05/2015   Scrotal edema 03/14/2017   Testicular ultrasound did show hydrocele  Will refer to urology regarding ongoign swelling, hydrocele.   Last Assessment & Plan:  Formatting of this note might be different from  the original.   Testicular ultrasound  did show hydrocele  Will refer to urology regarding ongoign swelling, hydrocele.   Shortness of breath    with exertion   Stage 3b chronic kidney disease (CKD) 03/22/2014   SVT (supraventricular tachycardia) 09/06/2013   Tremor of both hands 06/29/2017   Last Assessment & Plan:  Present since transplant, stable and unchanged Query from tacrolimus -Improved tremors noted and verbalized by pt - Prograf doses titrated per FK levels  Last Assessment & Plan:  Formatting of this note might be different from the original. Present since transplant, stable and unchanged Query from tacrolimus -Improved tremors noted and verbalized by pt - Prograf doses titr   Type 2 diabetes mellitus with hyperglycemia, with long-term current use of insulin 53/61/4431   Umbilical hernia    s/p repair 04/28/15    Past Surgical History:  Procedure Laterality Date   AORTIC VALVE REPLACEMENT N/A 10/05/2021   Procedure: AORTIC VALVE REPLACEMENT (AVR) WITH INSPIRIS RESILIA AORTIC VALVE SIZE 27MM;  Surgeon: [name], MD;  Location: [place];  Service: Open Heart Surgery;  Laterality: N/A;   AV FISTULA PLACEMENT Right 02/25/2015   Procedure: RIGHT ARTERIOVENOUS (AV) FISTULA CREATION;  Surgeon: Serafina Mitchell, MD;  Location: [place];  Service: Vascular;  Laterality: Right;   AV FISTULA PLACEMENT Right 09/15/2015   Procedure: ARTERIOVENOUS (AV) FISTULA CREATION- RIGHT ARM;  Surgeon: Serafina Mitchell, MD;  Location: [place];  Service: Vascular;  Laterality: Right;   CAPD INSERTION N/A 05/18/2014   Procedure: LAPAROSCOPIC INSERTION CONTINUOUS AMBULATORY PERITONEAL DIALYSIS  (CAPD) CATHETER;  Surgeon: Ralene Ok, MD;  Location: [place];  Service: General;  Laterality: N/A;   COLONOSCOPY     COLONOSCOPY WITH PROPOFOL N/A 07/12/2016   Procedure: COLONOSCOPY WITH PROPOFOL;  Surgeon: [name], MD;  Location: [facility];  Service: Endoscopy;  Laterality: N/A;   CORONARY ARTERY BYPASS GRAFT N/A 10/05/2021   Procedure: CORONARY  ARTERY BYPASS GRAFTING (CABG) X 1, ON PUMP, USING LEFT INTERNAL MAMMARY ARTERY CONDUIT;  Surgeon: [name], MD;  Location: [place];  Service: Open Heart Surgery;  Laterality: N/A;   declotting of fistula  08/2015   ESOPHAGOGASTRODUODENOSCOPY (EGD) WITH PROPOFOL N/A 07/12/2016   Procedure: ESOPHAGOGASTRODUODENOSCOPY (EGD) WITH PROPOFOL;  Surgeon: [name], MD;  [facility];  Service: Endoscopy;  Laterality: N/A;   FISTULA SUPERFICIALIZATION Right 02/07/2016   Procedure: RIIGHT UPPER ARM FISTULA SUPERFICIALIZATION;  Surgeon: Elam Dutch, MD;  Location: [place];  Service: Vascular;  Laterality: Right;   IJ catheter insertion     INSERTION OF DIALYSIS CATHETER Right 02/25/2015   Procedure: INSERTION OF RIGHT INTERNAL JUGULAR DIALYSIS CATHETER;  Surgeon: Serafina Mitchell, MD;  Location: [place] [facility];  Service: Vascular;  Laterality: Right;   KIDNEY TRANSPLANT     LAPAROSCOPIC REPOSITIONING CAPD CATHETER N/A 06/16/2014   Procedure: LAPAROSCOPIC REPOSITIONING CAPD CATHETER;  Surgeon: Ralene Ok, MD;  Location: [place];  Service: General;  Laterality: N/A;   LAPAROSCOPY N/A 05/04/2015   Procedure: LAPAROSCOPY DIAGNOSTIC LYSIS OF ADHESIONS;  Surgeon: Coralie Keens, MD;  Location: [place];  Service: General;  Laterality: N/A;   MINOR REMOVAL OF PERITONEAL DIALYSIS CATHETER N/A 04/28/2015   Procedure:  REMOVAL OF PERITONEAL DIALYSIS CATHETER;  Surgeon: Coralie Keens, MD;  Location: [place];  Service: General;  Laterality: N/A;   NEPHRECTOMY Left 1974   RENAL BIOPSY Right 2012   REVISON OF ARTERIOVENOUS FISTULA Right 05/26/2015   Procedure: SUPERFICIALIZATION OF ARTERIOVENOUS FISTULA WITH  SIDE BRANCH LIGATIONS;  Surgeon: Serafina Mitchell, MD;  Location: [facility];  Service: Vascular;  Laterality: Right;   RIGHT/LEFT HEART CATH AND CORONARY ANGIOGRAPHY N/A 08/22/2021   Procedure: RIGHT/LEFT HEART CATH AND CORONARY ANGIOGRAPHY;  Surgeon: Troy Sine, MD;  Location: [place] [facility];  Service:  Cardiovascular;  Laterality: N/A;   SKIN CANCER EXCISION     right shoulder   SVT ABLATION N/A 11/23/2016   Procedure: SVT Ablation;  Surgeon: Will Meredith Leeds, MD;  Location: [place] [facility];  Service: Cardiovascular;  Laterality: N/A;   TEE WITHOUT CARDIOVERSION N/A 10/05/2021   Procedure: TRANSESOPHAGEAL ECHOCARDIOGRAM (TEE);  Surgeon: [name], MD;  Location: [place];  Service: Open Heart Surgery;  Laterality: N/A;   UMBILICAL HERNIA REPAIR N/A 05/18/2014   Procedure: HERNIA REPAIR UMBILICAL ADULT;  Surgeon: Ralene Ok, MD;  Location: [place];  Service: General;  Laterality: N/A;   UMBILICAL HERNIA REPAIR N/A 04/28/2015   Procedure: UMBILICAL HERNIA REPAIR WITH MESH;  Surgeon: Coralie Keens, MD;  Location: [place];  Service: General;  Laterality: N/A;    Left nephrectomy in 1974.  No Known Allergies  Current Outpatient Medications  Medication Sig Dispense Refill   acetaminophen (TYLENOL) 325 MG tablet Take 2 tablets (650 mg total) by mouth every 4 (four) hours as needed for moderate pain.     amLODipine (NORVASC) 5 MG tablet Take 1.5 tablets (7.5 mg total) by mouth daily. 45 tablet 6   apixaban (ELIQUIS) 5 MG TABS tablet Take 1 tablet (5 mg total) by mouth 2 (two) times daily. Stop coumadin, monitor the PT/INR, and start Eliquis when the INR is <2. 180 tablet 3   B-D UF III MINI PEN NEEDLES 31G X 5 MM MISC USE TO INJECT INSULIN 4 TIMES A DAY 360 each 2   belatacept (NULOJIX) 250 MG SOLR injection Inject 24.5 mLs into the vein every 28 (twenty-eight) days.      carvedilol (COREG) 25 MG tablet Take 1 tablet (25 mg total) by mouth 2 (two) times daily with a meal.     chlorthalidone (HYGROTON) 25 MG tablet Take 1 tablet by mouth daily.     Continuous Blood Gluc Sensor (FREESTYLE LIBRE 3 SENSOR) MISC SMARTSIG:1 Topical Every 2 Weeks     doxazosin (CARDURA) 2 MG tablet Take 0.5 tablets (1 mg total) by mouth daily. Take 0.5 Tablet Daily 45 tablet 3   hydrALAZINE (APRESOLINE) 100  MG tablet TAKE 1 TABLET BY MOUTH EVERY 8 HOURS (Patient taking differently: Take 100 mg by mouth 2 (two) times daily. May take a noon dose if Systolic is > 962) 952 tablet 1   insulin aspart (NOVOLOG) 100 UNIT/ML injection Inject 5-20 Units into the skin daily as needed for high blood sugar.     insulin regular human CONCENTRATED (HUMULIN R) 500 UNIT/ML injection Inject 125 Units into the skin every 3 (three) days. Omnipod     levothyroxine (SYNTHROID) 112 MCG tablet Take 1 tablet (112 mcg total) by mouth daily. 90 tablet 3   mycophenolate (CELLCEPT) 250 MG capsule Take 1,000 mg by mouth every 12 (twelve) hours.      NEEDLE, DISP, 18 G (BD HYPODERMIC NEEDLE) 18G X 1" MISC USE AS DIRECTED TO INJECT 0.6MLS OF TESTOSTERONE EVERY 7 DAYS (DRAW UP) 100 each 1   nitroGLYCERIN (NITROSTAT) 0.4 MG SL tablet Place 0.4 mg under the tongue every 5 (five) minutes as needed for chest pain.     NON FORMULARY CPAP/BIPAP  at  bedtime  BIPAP per pt     predniSONE (DELTASONE) 5 MG tablet Take 5 mg by mouth daily with breakfast. Continuously     rosuvastatin (CRESTOR) 20 MG tablet Take 1 tablet (20 mg total) by mouth daily. 90 tablet 3   senna-docusate (SENOKOT-S) 8.6-50 MG tablet Take 1 tablet by mouth at bedtime as needed for mild constipation. (Patient taking differently: Take 1 tablet by mouth every other day.)     Syringe, Disposable, 1 ML MISC Use to draw up 0.60ml of testosterone medication 25 each 0   SYRINGE-NEEDLE, DISP, 3 ML (BD PLASTIPAK SYRINGE) 21G X 1" 3 ML MISC USE AS DIRECTED TO INJECT 0.6MLS OF TESTOSTERONE EVERY 7 DAYS (INJECT) 100 each 1   Testosterone Cypionate 200 MG/ML SOLN Inject 120 mg as directed once a week. Saturday     zolpidem (AMBIEN) 10 MG tablet Take 10 mg by mouth at bedtime as needed for sleep.     zonisamide (ZONEGRAN) 25 MG capsule $RemoveBe'25MG'cRDfDouDr$  DAILY FOR ONE WEEK, THEN $RemoveBe'50MG'XjtVALRpi$  DAILY 180 capsule 1   gabapentin (NEURONTIN) 100 MG capsule Take 200-300 mg by mouth See admin instructions. Taking 200  mg in the AM and 300 mg in the evening (Patient not taking: Reported on 07/26/2022)     tamsulosin (FLOMAX) 0.4 MG CAPS capsule Take 0.8 mg by mouth daily after supper. (Patient not taking: Reported on 07/26/2022)     No current facility-administered medications for this visit.   Social history is notable in that he is married for over 30 years. His 3 children. He has a degree in chemistry. 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 He does drink occasional alcohol. He has smoked cigars.  ROS General: Negative; No fevers, chills, or night sweats; positive for morbid obesity HEENT: Negative; No changes in vision or hearing, sinus congestion, difficulty swallowing Pulmonary: Recent cough with some purulent sputum, without fever, treated for bilateral pneumonia by Dr. 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  PE BP 126/72   Pulse 69   Ht $R'5\' 11"'qy$  (1.803 m)   Wt 267 lb 6.4 oz (121.3 kg)   SpO2 97%   BMI 37.29 kg/m    Left arm blood pressure 124/80;  he has a right upper arm fistula.  Wt Readings from Last 3 Encounters:  07/26/22 267 lb 6.4 oz (121.3 kg)  07/11/22 275 lb (124.7 kg)  06/21/22 287 lb 8 oz (130.4 kg)   General: Alert, oriented, no distress.  Fatigued Skin: normal turgor, no rashes, warm and  dry HEENT: Normocephalic, atraumatic. Pupils equal round and reactive to light; sclera anicteric; extraocular muscles intact;  Nose without nasal septal hypertrophy Mouth/Parynx benign; Mallinpatti scale 4 Neck: Thick neck no JVD, no carotid bruits; normal carotid upstroke Lungs: clear to ausculatation and percussion; no wheezing or rales Chest wall: without tenderness to palpitation Heart: PMI not displaced, RRR, s1 s2 normal, 1/6 systolic murmur, no diastolic murmur, no rubs, gallops, thrills, or heaves Abdomen: soft, nontender; no  hepatosplenomehaly, BS+; abdominal aorta nontender and not dilated by palpation. Back: no CVA tenderness Pulses 2+ Musculoskeletal: full range of motion, normal strength, no joint deformities Extremities: no clubbing cyanosis or edema, Homan's sign negative  Neurologic: grossly nonfocal; Cranial nerves grossly wnl Psychologic: Tired all the time   July 26, 2022  ECG (independently read by me): NSR at 69, QS V1-2; QTc 497 msec  March 12, 2022 ECG (independently read by me): NSR at 73, LVH with repolarization changes; QTc prolongation at 511  September 18, 2021 ECG (independently read by me): Normal sinus rhythm at 81 bpm, QS complex V1 through V3.  Inferior and lateral ST-T changes.  August 01, 2021 ECG (independently read by me):  NSR at 66, STT changes inferolaterally  July 11, 2021 ECG (independently read by me):  NSR at 69, LVH with repolarization, no ectopy, normal intervals    May 16, 2021 ECG (independently read by me):  Sinus rhythm at 61 with sinus arrythmia; LVH, T wave abnormality I, aVL, normal intervals  February 2022 ECG (independently read by me): NSR at 70; QS anteroseptally, inferolateral T wave abnormality  September 14, 2020 ECG (independently read by me): Normal sinus rhythm at 73 bpm.  Poor anterior R wave progression V1 through V3.  Mild ST changes laterally.  Normal intervals.  No ectopy.  November 20, 2019 ECG (independently  read by me): Normal sinus rhythm at 68 bpm.  Q-wave lead III.  Mild inferior T wave abnormality.  Normal intervals.  QTc interval 440 ms.  No ectopy  May 2019 ECG (independently read by me): NSR at 64; normal intervals.  No ectopy  November 2018 ECG (independently read by me): normal sinus rhythm at 67 bpm.  Normal intervals.  No ectopy.  September 2018 ECG (independently read by me): normal sinus rhythm at 71 bpm.  No ST segment changes.  Normal intervals.  May 2018 ECG (independently read by me): Normal sinus rhythm at 89 bpm.  No STT changes .  PR interval 126 ms.  QTc interval 457 ms. Normal ECG.  October 2017 ECG (independently read by me): Sinus tachycardia 101 bpm.  April 2017 ECG (independently read by me): Sinus tachycardia 100 bpm.  2.  TC interval 45 ms.  November 2014 ECG: Normal sinus rhythm;  PR interval 138 ms. QT interval 448 ms.  LABS:    Latest Ref Rng & Units 07/10/2022   11:41 AM 05/01/2022    3:54 AM 04/30/2022    2:14 PM  BMP  Glucose 70 - 99 mg/dL 140  119  81   BUN 8 - 23 mg/dL  41  38   Creatinine 0.61 - 1.24 mg/dL  2.16  2.21   Sodium 135 - 145 mmol/L  137  138   Potassium 3.5 - 5.1 mmol/L  3.4  3.5   Chloride 98 - 111 mmol/L  103  103   CO2 22 - 32 mmol/L  25  25   Calcium 8.9 - 10.3 mg/dL  9.6  10.4       Latest Ref Rng & Units 05/01/2022    3:54 AM 04/19/2022    3:17 PM 11/24/2021    3:02 PM  Hepatic Function  Total Protein 6.5 - 8.1 g/dL 5.8  6.8  6.1   Albumin 3.5 - 5.0 g/dL 3.5  4.8  3.7   AST 15 - 41 U/L $Remo'15  14  25   'MMZbQ$ ALT 0 - 44 U/L 13  11  41  Alk Phosphatase 38 - 126 U/L 59  88  89   Total Bilirubin 0.3 - 1.2 mg/dL 0.5  0.4  0.7   Bilirubin, Direct 0.0 - 0.2 mg/dL   0.2        Latest Ref Rng & Units 05/02/2022    9:47 AM 05/01/2022    3:54 AM 04/30/2022    2:14 PM  CBC  WBC 4.0 - 10.5 K/uL 7.6  7.5  8.4   Hemoglobin 13.0 - 17.0 g/dL 11.8  11.9  12.7   Hematocrit 39.0 - 52.0 % 36.0  36.4  39.3   Platelets 150 - 400 K/uL 213  222  247      Lab Results  Component Value Date   MCV 86.3 05/02/2022   MCV 86.1 05/01/2022   MCV 87.1 04/30/2022   Lab Results  Component Value Date   TSH 3.33 07/10/2022   Lab Results  Component Value Date   HGBA1C 7.1 (H) 07/10/2022   Lipid Panel     Component Value Date/Time   CHOL 116 05/02/2022 0334   TRIG 45 05/02/2022 0334   HDL 52 05/02/2022 0334   CHOLHDL 2.2 05/02/2022 0334   VLDL 9 05/02/2022 0334   LDLCALC 55 05/02/2022 0334    RADIOLOGY: No results found.  ECHO: 05/02/2021 IMPRESSIONS   1. Left ventricular ejection fraction, by estimation, is 60 to 65%. The  left ventricle has normal function. The left ventricle has no regional  wall motion abnormalities. There is moderate left ventricular hypertrophy.  Left ventricular diastolic  parameters are consistent with Grade II diastolic dysfunction  (pseudonormalization). Elevated left ventricular end-diastolic pressure.   2. Right ventricular systolic function is normal. The right ventricular  size is normal. There is moderately elevated pulmonary artery systolic  pressure.   3. Left atrial size was moderately dilated.   4. The mitral valve is abnormal. Mild mitral valve regurgitation.  Moderate to severe mitral annular calcification.   5. The aortic valve is tricuspid. There is moderate calcification of the  aortic valve. Aortic valve regurgitation is trivial. Moderate to severe  aortic valve stenosis. Aortic valve area, by VTI measures 1.09 cm. Aortic  valve mean gradient measures 44.4   mmHg. Aortic valve Vmax measures 3.99 m/s. DI is 0.29 - based on LVOT  diameter of 2.2 cm.   6. Aortic dilatation noted. There is mild dilatation of the ascending  aorta, measuring 42 mm.   7. The inferior vena cava is dilated in size with >50% respiratory  variability, suggesting right atrial pressure of 8 mmHg.   Comparison(s): Changes from prior study are noted. 11/16/2020: LVEF 65-70%,  moderate AS -mean gradient 34.6 mmHg.      R/L HEART CATH: 08/22/2021    2nd Mrg lesion is 50% stenosed.   Lat 2nd Mrg lesion is 20% stenosed.   RPAV lesion is 80% stenosed.   Dist LM to Prox LAD lesion is 80% stenosed.   Prox LAD to Mid LAD lesion is 90% stenosed.   LV end diastolic pressure is mildly elevated.    Right Atrium RA: A-wave 7, V wave 4; mean 4 RV: 41/6 PA: 41/11 PW: A-wave 17, V wave 15; mean 15.  Initial AO: 114/60          LV: 161/22  Pullback: LV: 162/23 AO: 120/62  By the Fick method, cardiac output 7.3 L/min with cardiac index 2.9 L/min/m.  PVR: 1.8WU  Mean aortic gradient 40 mmHg Peak to peak aortic gradient 42 mmHg  AVA: 0.95 cm2     IMPRESSION:  1. Essential hypertension   2. S/P AVR (aortic valve replacement)   3. S/P CABG (coronary artery bypass graft)   4. OSA on BiPAP   5. Hypothyroidism, unspecified type   6. Long term (current) use of anticoagulants   7. Hyperlipidemia with target LDL less than 70   8. History of renal transplantation   9. Stage 3b chronic kidney disease ([place])   10. Type 2 diabetes mellitus with complication, with long-term current use of insulin ([place])   11. Morbid obesity ([place])     ASSESSMENT AND PLAN: Robert [name] is a 61 -year-old male who has a history of morbid obesity, hypertension, diabetes mellitus, obstructive sleep apnea on BiPAP, aortic valve disease, hyperlipidemia, and end-stage kidney disease.  He is status post remote nephrectomy in1974 and on 02/25/2017 after having been on hemodialysis for 3 years he underwent successful renal transplantation from a living donor done at [facility].  He developed a postoperative inguinal hernia, which subsequently was repaired.  There also was a history of possible diverticular bleeding.  He has a history of SVT, but he has not had any recurrence.  He is followed closely by the transplantation team at [facility].   Over the years, he developed progressive aortic valve stenosis and developed  symptoms of exertional dyspnea leading to his right and left heart cardiac catheterization.  He was felt to have stage D severe aortic valve stenosis with class III symptoms with shortness of breath and chronic diastolic heart failure.  Ultimately he underwent successful AVR with a number 27 mm Edwards Inspiris Resilia pericardial valve and single-vessel CABG with a LIMA to his LAD.  He was felt to have CVA postoperatively due to embolism of precerebral artery and initially was anticoagulated with warfarin.  He has had subsequent rehospitalization with significant blood pressure elevation, polynephritis and also had an episode of COVID.  I reviewed his evaluation since my last encounter with him on March 12, 2022.  His blood pressure today is stable and on repeat by me was 124/80 on his regimen now consisting of amlodipine 7.5 mg daily, carvedilol 25 mg twice a day, chlorthalidone 25 mg daily, and hydralazine 100 mg twice a day.  He had serum creatinine of 2.16 in July 2023 and will be having a follow-up evaluation at [facility] at the end of the month.  He underwent AVR using an Edwards Inspiris Resilia pericardial valve and CABG x1 with a LIMA graft placed to his LAD by Dr. Cyndia Bent on October 05, 2021 with postoperative course complicated by postoperative atrial fibrillation and possible embolic stroke.  He has had issues with pyelonephritis.  Presently he admits to being tired all the time.  His most recent download with his BiPAP device shows elevated AHI at 7.1.  His BiPAP pressure has previously been set at 18/14.  I will change him to a auto mode with EPAP min of 14, pressure support of 4, and potential increase IPAP pressure to a maximum of 25 if necessary.  I provided him samples of Eliquis today.  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  He will be investigating possible disability which I would support.  He will be following up with his physicians at [facility] this month and has a  follow-up with Dr. Tomi Likens of neurology in December and Dr. Dwyane [name] his endocrinologist in January.  I will see him in 6 months for cardiology reevaluation  or sooner as needed.   Troy Sine, MD, [facility]  07/28/2022 2:20 PM

## 2022-07-26 NOTE — Patient Instructions (Signed)
Medication Instructions:  Your physician recommends that you continue on your current medications as directed. Please refer to the Current Medication list given to you today.  *If you need a refill on your cardiac medications before your next appointment, please call your pharmacy*   Follow-Up: At [facility], you and your health needs are our priority.  As part of our continuing mission to provide you with exceptional heart care, we have created designated Provider Care Teams.  These Care Teams include your primary Cardiologist (physician) and Advanced Practice Providers (APPs -  Physician Assistants and Nurse Practitioners) who all work together to provide you with the care you need, when you need it.  We recommend signing up for the patient portal called "MyChart".  Sign up information is provided on this After Visit Summary.  MyChart is used to connect with patients for Virtual Visits (Telemedicine).  Patients are able to view lab/test results, encounter notes, upcoming appointments, etc.  Non-urgent messages can be sent to your provider as well.   To learn more about what you can do with MyChart, go to [email or web address].    Your next appointment:   6 month(s)  The format for your next appointment:   In Person  Provider:   Thomas Kelly, MD    

## 2022-07-27 ENCOUNTER — Ambulatory Visit: Payer: 59

## 2022-07-27 ENCOUNTER — Telehealth: Payer: Self-pay | Admitting: Neurology

## 2022-07-27 NOTE — Telephone Encounter (Signed)
Pt wants to let dr.jaffe know they stopped the rehab b/c they said his balance was not going to get any better. Neuro rehab said there isnt anything else that can be done.

## 2022-07-28 ENCOUNTER — Encounter: Payer: Self-pay | Admitting: Cardiovascular Disease

## 2022-07-30 NOTE — Telephone Encounter (Signed)
Called and spoke with patient to follow up and see how he is feeling. Pt states that he is doing ok. Pt states that he has had a couple of BM's but nothing regular. Pt states that he is only taking 1 capful of Miralax daily. I told pt that he can increase to 3 doses daily until he achieves more normal bowel habits, he is aware that he can decrease PRN. Pt verbalized understanding and had no concerns at the end of the call.

## 2022-08-06 NOTE — Progress Notes (Unsigned)
NEUROLOGY FOLLOW UP OFFICE NOTE  Robert [name] [id number]  Assessment/Plan:   Unclear etiology of subjective numbness and weakness - I do not think it was stroke or TIA Chronic daily headaches - medication overuse Bilateral vestibular hypofunction History of post-operative left greater than right bilateral embolic infarcts s/p CABG and AVR Lumbar radiculopathy, right-sided Hypertension Type 2 diabetes mellitus ESRD s/p renal transplant, immunocompromised    He is also on multiple medications that may be contributing to symptoms of polypharmacy.     I will have him start nortriptyline '10mg'$  at bedtime to address chronic headaches but also may help with neuropathic pain and sleep. Therefore I will have him discontinue duloxetine and trazodone.  He has Ambien to help with sleep if nortriptyline ineffective alone. I have advised him to discontinue tramadol.  May use acetaminophen for acute headache but instructed him to limit use to no more than 2 days out of the week. Once he finishes current physical therapy and dizziness has not resolved, he will start vestibular rehab Secondary stroke prevention as managed by cardiology and PCP: Eliquis Statin.  LDL goal less than 70 Normotensive blood pressure Hgb A1c goal less than 7 Follow up in 4 months.    Subjective:  Robert [name] is a 61 year old left-handed male with CAD, DM II, aortic valve stenosis, ESRD s/p renal transplant with CKD3b, hypothyroidism, HTN, sleep apnea and paroxysmal atrial tachycardia and PSVT who follows up for stroke.   UPDATE: Current medications:  Nortriptyline ***, Eliquis, rosuvastatin, duloxetine '30mg'$  daily, gabapentin '200mg'$  AM and '300mg'$  PM, amlodipine, carvedilol, doxazosin, clonidine, furosemide, HCTZ, hydralazine, duloxetine, prednisone, Cellcept, tramadol     Started nortriptyline to address headaches and discontinued duloxetine and trazodone.  He reported side effects to nortriptyline so he was  restarted on duloxetine and trazodone and was instead started on zonisamide.  ***   HISTORY: Following CABG x1 and aortic valve replacement  on 10/05/2021, he noted that he was unable to lift his right leg.  He has history of right lower extremity weakness and pain following renal transplant in 2018 but his leg was now almost plegic.  CT head negative for acute abnormality.  MRI of brain on 10/11/2021 revealed scattered acute and subacute small infarcts in multiple vascular territories including left cerebellar hemisphere, left MCA territory and bilateral PCA territory.  MRA of head and neck demonstrated chronic occluded V4 segment of the right vertebral artery as well as moderate stenosis of the right ACA A2.  The intra-operative TEE on 12/29 revealed no evidence of thrombus.  He did develop post-operative valvular a fib and was started on warfarin.  To evaluate for possible radiculopathy, he also had MRI of lumbar spine which revealed multilevel degenerative disc and endplate disease with mild spinal and left lateral recess stenosis at L1-2, moderate right neural foraminal stenosis at L3 and bilateral L4 nerve levels and moderate to severe neural foraminal stenosis at the bilateral L5 nerve levels.  He was discharged to SNF.  He was readmitted to the hospital on 10/21/2021 for hypertensive urgency presenting as dizziness and nausea.  Blood pressure was 204/119.  CT head again demonstrated known subacute embolic infarcts but no new acute findings.  He had to be hospitalized again on 11/08/2021 for   pyelonephritis and UTI.    Since the stroke, he had been experiencing headaches and dizziness.  Headaches are severe throbbing pain from left eye radiating to both temples.  Sometimes nausea but no photophobia, phonophobia or visual  disturbance.  Occurs every other day.  Treats with tramadol and Tylenol.  Lasts 30 minutes with treatment, otherwise 1.5 hours.  Takes tramadol and Tylenol daily.  Dizziness is persistent  but will have episodes of increased severity lasting 15 minutes every 2 days.  He underwent vestibular evaluation by physical therapy.  Oculomotor exam revealed saccades with superior smooth pursuits, undershooting with saccadic testing, slow and guarded VOR, positive HIT right greater than left, and negative positional testing.    Since the stroke, he has had episode of vertical double vision for several seconds.  He had worsening symptoms on 11/24/2021 while working with OT/PT.  Still has right leg weakness.  Arm is improved.  He has trouble ambulating.  He was seen in the ED where etiology was thought to be due to hypoglycemia.  CT head and follow up MRI of brain showed no acute findings.  Since the stroke, he has trouble remembering events that had occurred around that time.  Underwent neuropsychological evaluation on 02/08/2022 consistent with primarily mild neurocognitive disorder in conjunction with underlying anxiety, chronic pain (neuropathy) and sleep dysfunction.    Admitted to hospital on 7/24 for UTI when he developed sudden onset of subjective bilateral facial numbness and right arm numbness and weakness.  Ongoing chronic dizziness and headache which is not new.  Other than known right leg weakness, no new objective focal deficits noted.  Stroke code initiated.  CT and subsequent MRI of head showed no acute stroke.  MRA head and neck revealed no LVO or hemodynamically significant stenosis.  Reported 8 mm left ICA terminus outpouching likely artifact due to motion.  2D echo showed EF 60-65%.  LDL 65.  Hgb A1c 7.4.  No change in secondary stroke prevention made in regards to antithrombotic therapy and statin- -advised to continue Eliquis and Crestor.    Past medications: nortriptyline (side effects)  PAST MEDICAL HISTORY: Past Medical History:  Diagnosis Date   Acute cystitis without hematuria 11/09/2021   AKI (acute kidney injury) 08/08/2017   Elevated sCr to 2.5 (baseline ~1.7) noted at OSH -  Subsequent transfer to [facility]  - Currently on IVMF in the setting of diarrhea and AKI - sCr at baseline at time of discharge   Anemia in chronic kidney disease 03/30/2015   Anticoagulated on Coumadin 11/09/2021   Aortic valve stenosis    Arthritis    Atrial fibrillation 11/29/2021   Benign essential hypertension 07/30/2014   prior to transplant pt took Metorpolol tartrate '50mg'$  qd on off HD days   Benign neoplasm of transverse colon    Cancer    skin ca right shoulder, plastic dsyplasia, pre-Ca polpys removed on Colonoscopy- 07/2014   Cerebrovascular accident (CVA) due to embolism of precerebral artery    Chronic renal failure    Colon polyps 07/23/2014   Tubular adenomas x 5   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   Diverticulosis of colon without hemorrhage    Eczema    ESRD (end stage renal disease)    hemodialysis 06/2014-02/2017, s/p living unrelated kidney transplant 02/26/17   Exertional dyspnea    Fatigue 01/08/2014   Fissure of skin 03/08/2020   GI bleed 07/26/2017   Headache    Heart murmur    aortic stenosis (moderate-severe 04/2021)  History of COVID-19 2023   History of unilateral nephrectomy 09/06/2013   Hydrocele, acquired 04/07/2017   Hyperlipidemia 09/06/2013   Hypertensive urgency 10/21/2021   Hyponatremia 11/09/2021   Hypothyroidism    Immunosuppressed status 02/26/2017   Induction agent: Solumedrol - Envarsus '3mg'$  daily  - Home dose decreased from '5mg'$  to '3mg'$  in the setting of FK 15.7 while inpatient   - Previously on Prograf, though d/c'ed during previous admission; suspected this may be causing hallucinations  - Cellcept '1000mg'$  Q12H  - Prednisone '5mg'$  daily   Inguinal hernia without obstruction or gangrene 06/30/2017   Right inguinal  wall defect appreciated by CT imaging Center and on exam Dr. Paulla Fore today Dr. Paulla Fore has discussed hernia repair with mesh at some point in near future  Last Assessment & Plan:  Formatting of this note might be different from the original. Right inguinal wall defect appreciated by CT imaging [facility] and on exam Dr. Paulla Fore today Dr. Paulla Fore has discussed hern   Kidney transplanted 02/26/2017   Date of Transplant = 02/26/17  ABO (Donor/Recipient) = compatible Blood type: O+/A+ DonorType: Living donor, unrelated Allograft type:Left kidney Donor anatomy: 2 arteries, 1 veins and single ureter  Pants-to-skirt anastomosis of dual renal arteries on the backtable. Donor Kidney Bx: Not applicable Allograft injury/complications: None Ne   Lower extremity edema 09/06/2013   Macular degeneration    Mild vascular neurocognitive disorder 02/08/2022   OAB (overactive bladder) 12/28/2019   Obstructive sleep apnea 09/06/2013   On Bi-PAP  Last Assessment & Plan:  Formatting of this note might be different from the original. - History of OSA, continue CPAP at night   Peritoneal dialysis catheter in place 02/24/2015   RLQ abdominal pain 04/19/2020   S/P AVR (aortic valve replacement) 10/05/2021   SBO (small bowel obstruction) 05/2015   Scrotal edema 03/14/2017   Testicular ultrasound did show hydrocele  Will refer to urology regarding ongoign swelling, hydrocele.   Last Assessment & Plan:  Formatting of this note might be different from the original.   Testicular ultrasound did show hydrocele  Will refer to urology regarding ongoign swelling, hydrocele.   Shortness of breath    with exertion   Stage 3b chronic kidney disease (CKD) 03/22/2014   SVT (supraventricular tachycardia) 09/06/2013   Tremor of both hands 06/29/2017   Last Assessment & Plan:  Present since transplant, stable and unchanged Query from tacrolimus -Improved tremors noted and verbalized by pt - Prograf doses titrated per FK levels  Last Assessment &  Plan:  Formatting of this note might be different from the original. Present since transplant, stable and unchanged Query from tacrolimus -Improved tremors noted and verbalized by pt - Prograf doses titr   Type 2 diabetes mellitus with hyperglycemia, with long-term current use of insulin 62/95/2841   Umbilical hernia    s/p repair 04/28/15    MEDICATIONS: Current Outpatient Medications on File Prior to Visit  Medication Sig Dispense Refill   acetaminophen (TYLENOL) 325 MG tablet Take 2 tablets (650 mg total) by mouth every 4 (four) hours as needed for moderate pain.     amLODipine (NORVASC) 5 MG tablet Take 1.5 tablets (7.5 mg total) by mouth daily. 45 tablet 6   apixaban (ELIQUIS) 5 MG TABS tablet Take 1 tablet (5 mg total) by mouth 2 (two) times daily. Stop coumadin, monitor the PT/INR, and start Eliquis when the INR is <2. 180 tablet 3   B-D UF III MINI PEN NEEDLES 31G X 5 MM  MISC USE TO INJECT INSULIN 4 TIMES A DAY 360 each 2   belatacept (NULOJIX) 250 MG SOLR injection Inject 24.5 mLs into the vein every 28 (twenty-eight) days.      carvedilol (COREG) 25 MG tablet Take 1 tablet (25 mg total) by mouth 2 (two) times daily with a meal.     chlorthalidone (HYGROTON) 25 MG tablet Take 1 tablet by mouth daily.     Continuous Blood Gluc Sensor (FREESTYLE LIBRE 3 SENSOR) MISC SMARTSIG:1 Topical Every 2 Weeks     doxazosin (CARDURA) 2 MG tablet Take 0.5 tablets (1 mg total) by mouth daily. Take 0.5 Tablet Daily 45 tablet 3   gabapentin (NEURONTIN) 100 MG capsule Take 200-300 mg by mouth See admin instructions. Taking 200 mg in the AM and 300 mg in the evening (Patient not taking: Reported on 07/26/2022)     hydrALAZINE (APRESOLINE) 100 MG tablet TAKE 1 TABLET BY MOUTH EVERY 8 HOURS (Patient taking differently: Take 100 mg by mouth 2 (two) times daily. May take a noon dose if Systolic is > 938) 182 tablet 1   insulin aspart (NOVOLOG) 100 UNIT/ML injection Inject 5-20 Units into the skin daily as needed  for high blood sugar.     insulin regular human CONCENTRATED (HUMULIN R) 500 UNIT/ML injection Inject 125 Units into the skin every 3 (three) days. Omnipod     levothyroxine (SYNTHROID) 112 MCG tablet Take 1 tablet (112 mcg total) by mouth daily. 90 tablet 3   mycophenolate (CELLCEPT) 250 MG capsule Take 1,000 mg by mouth every 12 (twelve) hours.      NEEDLE, DISP, 18 G (BD HYPODERMIC NEEDLE) 18G X 1" MISC USE AS DIRECTED TO INJECT 0.6MLS OF TESTOSTERONE EVERY 7 DAYS (DRAW UP) 100 each 1   nitroGLYCERIN (NITROSTAT) 0.4 MG SL tablet Place 0.4 mg under the tongue every 5 (five) minutes as needed for chest pain.     NON FORMULARY CPAP/BIPAP  at bedtime  BIPAP per pt     predniSONE (DELTASONE) 5 MG tablet Take 5 mg by mouth daily with breakfast. Continuously     rosuvastatin (CRESTOR) 20 MG tablet Take 1 tablet (20 mg total) by mouth daily. 90 tablet 3   senna-docusate (SENOKOT-S) 8.6-50 MG tablet Take 1 tablet by mouth at bedtime as needed for mild constipation. (Patient taking differently: Take 1 tablet by mouth every other day.)     Syringe, Disposable, 1 ML MISC Use to draw up 0.7m of testosterone medication 25 each 0   SYRINGE-NEEDLE, DISP, 3 ML (BD PLASTIPAK SYRINGE) 21G X 1" 3 ML MISC USE AS DIRECTED TO INJECT 0.6MLS OF TESTOSTERONE EVERY 7 DAYS (INJECT) 100 each 1   tamsulosin (FLOMAX) 0.4 MG CAPS capsule Take 0.8 mg by mouth daily after supper. (Patient not taking: Reported on 07/26/2022)     Testosterone Cypionate 200 MG/ML SOLN Inject 120 mg as directed once a week. Saturday     zolpidem (AMBIEN) 10 MG tablet Take 10 mg by mouth at bedtime as needed for sleep.     zonisamide (ZONEGRAN) 25 MG capsule '25MG'$  DAILY FOR ONE WEEK, THEN '50MG'$  DAILY 180 capsule 1   No current facility-administered medications on file prior to visit.    ALLERGIES: No Known Allergies  FAMILY HISTORY: Family History  Problem Relation Age of Onset   Colon polyps Father    Stroke Father    Dementia Father         vascular   Colon cancer Neg Hx  Stomach cancer Neg Hx       Objective:  *** General: No acute distress.  Patient appears ***-groomed.   Head:  Normocephalic/atraumatic Eyes:  Fundi examined but not visualized Neck: supple, no paraspinal tenderness, full range of motion Heart:  Regular rate and rhythm Lungs:  Clear to auscultation bilaterally Back: No paraspinal tenderness Neurological Exam: alert and oriented to person, place, and time.  Speech fluent and not dysarthric, language intact.  CN II-XII intact. Bulk and tone normal, muscle strength 5/5 throughout.  Sensation to light touch intact.  Deep tendon reflexes 2+ throughout, toes downgoing.  Finger to nose testing intact.  Gait normal, Romberg negative.   Metta Clines, DO  CC: ***

## 2022-08-08 ENCOUNTER — Encounter: Payer: Self-pay | Admitting: Neurology

## 2022-08-08 ENCOUNTER — Ambulatory Visit (INDEPENDENT_AMBULATORY_CARE_PROVIDER_SITE_OTHER): Payer: 59 | Admitting: Neurology

## 2022-08-08 VITALS — BP 100/50 | Ht 72.0 in | Wt 276.0 lb

## 2022-08-08 DIAGNOSIS — H832X3 Labyrinthine dysfunction, bilateral: Secondary | ICD-10-CM

## 2022-08-08 DIAGNOSIS — R519 Headache, unspecified: Secondary | ICD-10-CM

## 2022-08-08 DIAGNOSIS — F0670 Mild neurocognitive disorder due to known physiological condition without behavioral disturbance: Secondary | ICD-10-CM

## 2022-08-08 DIAGNOSIS — G8929 Other chronic pain: Secondary | ICD-10-CM | POA: Diagnosis not present

## 2022-08-08 DIAGNOSIS — I999 Unspecified disorder of circulatory system: Secondary | ICD-10-CM | POA: Diagnosis not present

## 2022-08-08 NOTE — Patient Instructions (Signed)
Continue zonisamide '50mg'$  at bedtime

## 2022-08-09 ENCOUNTER — Other Ambulatory Visit: Payer: Self-pay | Admitting: Physician Assistant

## 2022-08-13 ENCOUNTER — Other Ambulatory Visit: Payer: Self-pay

## 2022-08-13 ENCOUNTER — Telehealth: Payer: Self-pay

## 2022-08-13 DIAGNOSIS — E291 Testicular hypofunction: Secondary | ICD-10-CM

## 2022-08-13 MED ORDER — "BD PLASTIPAK SYRINGE 21G X 1"" 3 ML MISC": 1 refills | Status: AC

## 2022-08-13 NOTE — Telephone Encounter (Signed)
Patient called in about CGM. States you wanted him to switch to [place]. He is currently paying $244 for 90 day supply for libre. Also, states he has not gotten testosterone syringes and has not been able to take his testosterone. I have informed patient that we can supply him with some as you requested. He will stop by tomorrow.

## 2022-08-17 ENCOUNTER — Other Ambulatory Visit: Payer: Self-pay

## 2022-08-17 DIAGNOSIS — E1165 Type 2 diabetes mellitus with hyperglycemia: Secondary | ICD-10-CM

## 2022-08-17 MED ORDER — FREESTYLE LIBRE 3 SENSOR MISC: 2 refills | Status: DC

## 2022-08-17 NOTE — Telephone Encounter (Signed)
Patient picked up needles for testosterone.

## 2022-09-06 ENCOUNTER — Encounter: Payer: Self-pay | Admitting: Neurology

## 2022-09-14 ENCOUNTER — Ambulatory Visit: Payer: 59 | Admitting: Neurology

## 2022-09-18 ENCOUNTER — Encounter: Payer: Self-pay | Admitting: Podiatry

## 2022-09-18 ENCOUNTER — Ambulatory Visit (INDEPENDENT_AMBULATORY_CARE_PROVIDER_SITE_OTHER): Payer: 59 | Admitting: Podiatry

## 2022-09-18 DIAGNOSIS — N189 Chronic kidney disease, unspecified: Secondary | ICD-10-CM

## 2022-09-18 DIAGNOSIS — M79675 Pain in left toe(s): Secondary | ICD-10-CM

## 2022-09-18 DIAGNOSIS — E1149 Type 2 diabetes mellitus with other diabetic neurological complication: Secondary | ICD-10-CM

## 2022-09-18 DIAGNOSIS — B351 Tinea unguium: Secondary | ICD-10-CM

## 2022-09-18 DIAGNOSIS — M79674 Pain in right toe(s): Secondary | ICD-10-CM | POA: Diagnosis not present

## 2022-09-18 LAB — HM DIABETES FOOT EXAM

## 2022-09-18 NOTE — Progress Notes (Signed)
This patient returns to my office for at risk foot care.  This patient requires this care by a professional since this patient will be at risk due to having uncontrolled diabetes and kidney transplant.  This patient is unable to cut nails himself since the patient cannot reach his nails.These nails are painful walking and wearing shoes.  Resolved heel fissure.   This patient presents for at risk foot care today.  General Appearance  Alert, conversant and in no acute stress.  Vascular  Dorsalis pedis and posterior tibial  pulses are palpable  bilaterally.  Capillary return is within normal limits  bilaterally. Temperature is within normal limits  bilaterally.  Neurologic  Senn-Weinstein monofilament wire test diminished   bilaterally. Muscle power within normal limits bilaterally.  Nails Thick disfigured discolored nails with subungual debris  from hallux to fifth toes bilaterally. No evidence of bacterial infection or drainage bilaterally.  Orthopedic  No limitations of motion  feet .  No crepitus or effusions noted.  No bony pathology or digital deformities noted. HAV  B/L.  Skin  normotropic skin with no porokeratosis noted bilaterally.  No signs of infections or ulcers noted.     Onychomycosis  Pain in right toes  Pain in left toes   Consent was obtained for treatment procedures.   Mechanical debridement of nails 1-5  bilaterally performed with a nail nipper.  Filed with dremel without incident.     Return office visit  3 months                   Told patient to return for periodic foot care and evaluation due to potential at risk complications.   Gardiner Barefoot DPM

## 2022-09-20 ENCOUNTER — Telehealth: Payer: Self-pay | Admitting: Endocrinology

## 2022-09-20 NOTE — Telephone Encounter (Signed)
Patient is calling and is extremely irate saying that he knows that we have received faxes from [facility] requesting information concerning the [place].  Patient states that he wants the information faxed to [facility] at [phone] and he wants it done today.  Patient states he only has 1 day of supply lest.

## 2022-09-24 ENCOUNTER — Ambulatory Visit: Payer: 59 | Admitting: Neurology

## 2022-09-24 NOTE — Telephone Encounter (Signed)
F/u   Patient calling back following up on Omnipod Dash.   Patient is aware of CMA documentation verbalized understand.    Asking for a call back once information is faxed over to [facility].

## 2022-09-27 ENCOUNTER — Encounter: Payer: Self-pay | Admitting: Neurology

## 2022-09-28 ENCOUNTER — Other Ambulatory Visit: Payer: Self-pay | Admitting: Neurology

## 2022-09-28 MED ORDER — ZONISAMIDE 25 MG PO CAPS: 75.0000 mg | ORAL_CAPSULE | Freq: Every day | ORAL | 5 refills | Status: DC

## 2022-10-11 ENCOUNTER — Ambulatory Visit: Payer: 59 | Admitting: Endocrinology

## 2022-10-15 ENCOUNTER — Encounter (HOSPITAL_COMMUNITY): Payer: Self-pay

## 2022-10-15 ENCOUNTER — Other Ambulatory Visit: Payer: Self-pay

## 2022-10-15 ENCOUNTER — Emergency Department (HOSPITAL_COMMUNITY): Payer: 59

## 2022-10-15 ENCOUNTER — Encounter: Payer: Self-pay | Admitting: Neurology

## 2022-10-15 ENCOUNTER — Emergency Department (HOSPITAL_COMMUNITY)
Admission: EM | Admit: 2022-10-15 | Discharge: 2022-10-15 | Disposition: A | Discharge status: Home / Self Care | Payer: 59 | Attending: Emergency Medicine | Admitting: Emergency Medicine

## 2022-10-15 ENCOUNTER — Encounter: Payer: Self-pay | Admitting: Cardiovascular Disease

## 2022-10-15 DIAGNOSIS — Z7901 Long term (current) use of anticoagulants: Secondary | ICD-10-CM | POA: Insufficient documentation

## 2022-10-15 DIAGNOSIS — Z940 Kidney transplant status: Secondary | ICD-10-CM | POA: Insufficient documentation

## 2022-10-15 DIAGNOSIS — R202 Paresthesia of skin: Secondary | ICD-10-CM | POA: Diagnosis not present

## 2022-10-15 DIAGNOSIS — E1122 Type 2 diabetes mellitus with diabetic chronic kidney disease: Secondary | ICD-10-CM | POA: Insufficient documentation

## 2022-10-15 DIAGNOSIS — E162 Hypoglycemia, unspecified: Secondary | ICD-10-CM

## 2022-10-15 DIAGNOSIS — E11649 Type 2 diabetes mellitus with hypoglycemia without coma: Secondary | ICD-10-CM | POA: Diagnosis not present

## 2022-10-15 DIAGNOSIS — Z79899 Other long term (current) drug therapy: Secondary | ICD-10-CM | POA: Diagnosis not present

## 2022-10-15 DIAGNOSIS — Z8673 Personal history of transient ischemic attack (TIA), and cerebral infarction without residual deficits: Secondary | ICD-10-CM | POA: Insufficient documentation

## 2022-10-15 DIAGNOSIS — R29818 Other symptoms and signs involving the nervous system: Secondary | ICD-10-CM | POA: Diagnosis not present

## 2022-10-15 DIAGNOSIS — R42 Dizziness and giddiness: Secondary | ICD-10-CM | POA: Diagnosis present

## 2022-10-15 DIAGNOSIS — Z794 Long term (current) use of insulin: Secondary | ICD-10-CM | POA: Diagnosis not present

## 2022-10-15 DIAGNOSIS — N186 End stage renal disease: Secondary | ICD-10-CM | POA: Insufficient documentation

## 2022-10-15 DIAGNOSIS — N3000 Acute cystitis without hematuria: Secondary | ICD-10-CM | POA: Diagnosis not present

## 2022-10-15 DIAGNOSIS — I120 Hypertensive chronic kidney disease with stage 5 chronic kidney disease or end stage renal disease: Secondary | ICD-10-CM | POA: Insufficient documentation

## 2022-10-15 LAB — RAPID URINE DRUG SCREEN, HOSP PERFORMED
Amphetamines: NOT DETECTED
Barbiturates: NOT DETECTED
Benzodiazepines: NOT DETECTED
Cocaine: NOT DETECTED
Opiates: NOT DETECTED
Tetrahydrocannabinol: NOT DETECTED

## 2022-10-15 LAB — I-STAT CHEM 8, ED
BUN: 32 mg/dL — ABNORMAL HIGH (ref 8–23)
Calcium, Ion: 1.22 mmol/L (ref 1.15–1.40)
Chloride: 106 mmol/L (ref 98–111)
Creatinine, Ser: 1.9 mg/dL — ABNORMAL HIGH (ref 0.61–1.24)
Glucose, Bld: 102 mg/dL — ABNORMAL HIGH (ref 70–99)
HCT: 38 % — ABNORMAL LOW (ref 39.0–52.0)
Hemoglobin: 12.9 g/dL — ABNORMAL LOW (ref 13.0–17.0)
Potassium: 3.8 mmol/L (ref 3.5–5.1)
Sodium: 138 mmol/L (ref 135–145)
TCO2: 21 mmol/L — ABNORMAL LOW (ref 22–32)

## 2022-10-15 LAB — CBC
HCT: 41.4 % (ref 39.0–52.0)
Hemoglobin: 13.4 g/dL (ref 13.0–17.0)
MCH: 28.3 pg (ref 26.0–34.0)
MCHC: 32.4 g/dL (ref 30.0–36.0)
MCV: 87.5 fL (ref 80.0–100.0)
Platelets: 208 10*3/uL (ref 150–400)
RBC: 4.73 MIL/uL (ref 4.22–5.81)
RDW: 14.3 % (ref 11.5–15.5)
WBC: 6.9 10*3/uL (ref 4.0–10.5)
nRBC: 0 % (ref 0.0–0.2)

## 2022-10-15 LAB — URINALYSIS, ROUTINE W REFLEX MICROSCOPIC
Bilirubin Urine: NEGATIVE
Glucose, UA: NEGATIVE mg/dL
Ketones, ur: NEGATIVE mg/dL
Nitrite: NEGATIVE
Protein, ur: 100 mg/dL — AB
Specific Gravity, Urine: 1.011 (ref 1.005–1.030)
pH: 6 (ref 5.0–8.0)

## 2022-10-15 LAB — COMPREHENSIVE METABOLIC PANEL
ALT: 13 U/L (ref 0–44)
AST: 19 U/L (ref 15–41)
Albumin: 3.9 g/dL (ref 3.5–5.0)
Alkaline Phosphatase: 50 U/L (ref 38–126)
Anion gap: 10 (ref 5–15)
BUN: 32 mg/dL — ABNORMAL HIGH (ref 8–23)
CO2: 20 mmol/L — ABNORMAL LOW (ref 22–32)
Calcium: 9.4 mg/dL (ref 8.9–10.3)
Chloride: 106 mmol/L (ref 98–111)
Creatinine, Ser: 1.92 mg/dL — ABNORMAL HIGH (ref 0.61–1.24)
GFR, Estimated: 39 mL/min — ABNORMAL LOW (ref >60)
Glucose, Bld: 106 mg/dL — ABNORMAL HIGH (ref 70–99)
Potassium: 3.8 mmol/L (ref 3.5–5.1)
Sodium: 136 mmol/L (ref 135–145)
Total Bilirubin: 1 mg/dL (ref 0.3–1.2)
Total Protein: 6.2 g/dL — ABNORMAL LOW (ref 6.5–8.1)

## 2022-10-15 LAB — DIFFERENTIAL
Abs Immature Granulocytes: 0.08 10*3/uL — ABNORMAL HIGH (ref 0.00–0.07)
Basophils Absolute: 0 10*3/uL (ref 0.0–0.1)
Basophils Relative: 1 %
Eosinophils Absolute: 0.1 10*3/uL (ref 0.0–0.5)
Eosinophils Relative: 2 %
Immature Granulocytes: 1 %
Lymphocytes Relative: 9 %
Lymphs Abs: 0.6 10*3/uL — ABNORMAL LOW (ref 0.7–4.0)
Monocytes Absolute: 0.6 10*3/uL (ref 0.1–1.0)
Monocytes Relative: 9 %
Neutro Abs: 5.5 10*3/uL (ref 1.7–7.7)
Neutrophils Relative %: 78 %

## 2022-10-15 LAB — PROTIME-INR
INR: 1.1 (ref 0.8–1.2)
Prothrombin Time: 14.5 seconds (ref 11.4–15.2)

## 2022-10-15 LAB — APTT: aPTT: 31 seconds (ref 24–36)

## 2022-10-15 LAB — CBG MONITORING, ED
Glucose-Capillary: 115 mg/dL — ABNORMAL HIGH (ref 70–99)
Glucose-Capillary: 59 mg/dL — ABNORMAL LOW (ref 70–99)
Glucose-Capillary: 68 mg/dL — ABNORMAL LOW (ref 70–99)
Glucose-Capillary: 78 mg/dL (ref 70–99)

## 2022-10-15 LAB — ETHANOL: Alcohol, Ethyl (B): <10 mg/dL (ref <10)

## 2022-10-15 MED ORDER — ACETAMINOPHEN 325 MG PO TABS
650.0000 mg | ORAL_TABLET | Freq: Once | ORAL | Status: AC
  Administered 2022-10-15: 650 mg via ORAL
  Filled 2022-10-15: qty 2

## 2022-10-15 MED ORDER — SULFAMETHOXAZOLE-TRIMETHOPRIM 800-160 MG PO TABS: 1.0000 | ORAL_TABLET | Freq: Two times a day (BID) | ORAL | 0 refills | Status: AC

## 2022-10-15 MED ORDER — SULFAMETHOXAZOLE-TRIMETHOPRIM 800-160 MG PO TABS
1.0000 | ORAL_TABLET | Freq: Once | ORAL | Status: AC
  Administered 2022-10-15: 1 via ORAL
  Filled 2022-10-15: qty 1

## 2022-10-15 NOTE — ED Provider Notes (Signed)
[facility] EMERGENCY DEPARTMENT Provider Note   CSN: [id number] Arrival date & time: 10/15/22  1558  An emergency department physician performed an initial assessment on this suspected stroke patient at 1625.  History  Chief Complaint  Patient presents with   Dizziness    Robert [name] is a 62 y.o. male.  Is a 62 year old male with a past medical history of hypertension, diabetes, aortic valve replacement, CVA, CKD status post kidney transplant presenting to the emergency department with dizziness.  The patient states that he was diagnosed with the flu last weekend and his flu symptoms have improved.  He states that however the last few days he has had intermittent increase in his neuropathy and at 930 this morning his numbness on his right side became significantly worse and he started to develop dizziness.  He states it feels both lightheaded type of dizziness like he might pass out and also feels as though the room is spinning.  He states that he has had previous TIAs in the past and will occasionally have mild symptoms but states that this is significantly more severe.  He states that he is also had a right-sided sensation of a "basketball on his head" but he states that this does not feel like pain for the last 3 days.  The history is provided by the patient.  Dizziness      Home Medications Prior to Admission medications   Medication Sig Start Date End Date Taking? Authorizing Provider  sulfamethoxazole-trimethoprim (BACTRIM DS) 800-160 MG tablet Take 1 tablet by mouth 2 (two) times daily for 7 days. 10/15/22 10/22/22 Yes Leanord Asal K, DO  acetaminophen (TYLENOL) 325 MG tablet Take 2 tablets (650 mg total) by mouth every 4 (four) hours as needed for moderate pain. 10/15/21   Nani Skillern, PA-C  amLODipine (NORVASC) 5 MG tablet Take 1.5 tablets (7.5 mg total) by mouth daily. 05/25/22 05/20/23  Deberah Pelton, NP  apixaban (ELIQUIS) 5 MG TABS tablet  Take 1 tablet (5 mg total) by mouth 2 (two) times daily. Stop coumadin, monitor the PT/INR, and start Eliquis when the INR is <2. 01/31/22   Warren Lacy, PA-C  B-D UF III MINI PEN NEEDLES 31G X 5 MM MISC USE TO INJECT INSULIN 4 TIMES A DAY 05/23/22   Elayne Snare, MD  belatacept (NULOJIX) 250 MG SOLR injection Inject 24.5 mLs into the vein every 28 (twenty-eight) days.  08/30/17   [provider]  carvedilol (COREG) 25 MG tablet Take 1 tablet (25 mg total) by mouth 2 (two) times daily with a meal. 10/26/21   Dahal, Marlowe Aschoff, MD  chlorthalidone (HYGROTON) 25 MG tablet Take 1 tablet by mouth daily. 04/06/22 04/06/23  [provider]  Continuous Blood Gluc Sensor (FREESTYLE LIBRE 3 SENSOR) MISC SMARTSIG:1 Topical Every 2 Weeks 08/17/22   Elayne Snare, MD  doxazosin (CARDURA) 2 MG tablet TAKE 0.5 TABLETS (1 MG TOTAL) BY MOUTH DAILY. TAKE 0.5 TABLET DAILY 08/09/22   Troy Sine, MD  gabapentin (NEURONTIN) 100 MG capsule Take 200-300 mg by mouth See admin instructions. Taking 200 mg in the AM and 300 mg in the evening    [provider]  hydrALAZINE (APRESOLINE) 100 MG tablet TAKE 1 TABLET BY MOUTH EVERY 8 HOURS Patient taking differently: Take 100 mg by mouth 2 (two) times daily. May take a noon dose if Systolic is > 623 7/62/83   Croitoru, Mihai, MD  insulin aspart (NOVOLOG) 100 UNIT/ML injection Inject 5-20  Units into the skin daily as needed for high blood sugar.    [provider]  insulin regular human CONCENTRATED (HUMULIN R) 500 UNIT/ML injection Inject 125 Units into the skin every 3 (three) days. Omnipod    [provider]  levothyroxine (SYNTHROID) 112 MCG tablet Take 1 tablet (112 mcg total) by mouth daily. 01/09/22   Elayne Snare, MD  mycophenolate (CELLCEPT) 250 MG capsule Take 1,000 mg by mouth every 12 (twelve) hours.     [provider]  NEEDLE, DISP, 18 G (BD HYPODERMIC NEEDLE) 18G X 1" MISC USE AS DIRECTED TO INJECT 0.6MLS OF  TESTOSTERONE EVERY 7 DAYS (DRAW UP) 07/11/22   Elayne Snare, MD  nitroGLYCERIN (NITROSTAT) 0.4 MG SL tablet Place 0.4 mg under the tongue every 5 (five) minutes as needed for chest pain.    [provider]  NON FORMULARY CPAP/BIPAP  at bedtime  BIPAP per pt    [provider]  predniSONE (DELTASONE) 5 MG tablet Take 5 mg by mouth daily with breakfast. Continuously    [provider]  rosuvastatin (CRESTOR) 20 MG tablet Take 1 tablet (20 mg total) by mouth daily. 02/09/22   Elayne Snare, MD  senna-docusate (SENOKOT-S) 8.6-50 MG tablet Take 1 tablet by mouth at bedtime as needed for mild constipation. Patient taking differently: Take 1 tablet by mouth every other day. 10/26/21   Terrilee Croak, MD  Syringe, Disposable, 1 ML MISC Use to draw up 0.3m of testosterone medication 07/11/22   KElayne Snare MD  SYRINGE-NEEDLE, DISP, 3 ML (BD PLASTIPAK SYRINGE) 21G X 1" 3 ML MISC USE AS DIRECTED TO INJECT 0.6MLS OF TESTOSTERONE EVERY 7 DAYS (INJECT) 08/13/22   KElayne Snare MD  tamsulosin (FLOMAX) 0.4 MG CAPS capsule Take 0.8 mg by mouth daily after supper.    [provider]  Testosterone Cypionate 200 MG/ML SOLN Inject 120 mg as directed once a week. Saturday    [provider]  zolpidem (AMBIEN) 10 MG tablet Take 10 mg by mouth at bedtime as needed for sleep.    [provider]  zonisamide (ZONEGRAN) 25 MG capsule Take 3 capsules (75 mg total) by mouth daily. 09/28/22   JPieter Partridge DO      Allergies    Patient has no known allergies.    Review of Systems   Review of Systems  Neurological:  Positive for dizziness.    Physical Exam Updated Vital Signs BP (!) 157/72   Pulse 61   Temp 98.1 F (36.7 C)   Resp (!) 21   Ht '5\' 11"'$  (1.803 m)   Wt 127 kg   SpO2 98%   BMI 39.05 kg/m  Physical Exam Vitals and nursing note reviewed.  Constitutional:      General: He is not in acute distress.    Appearance: Normal appearance. He is obese.  HENT:      Head: Normocephalic and atraumatic.     Nose: Nose normal.     Mouth/Throat:     Mouth: Mucous membranes are moist.     Pharynx: Oropharynx is clear.  Eyes:     Extraocular Movements: Extraocular movements intact.     Conjunctiva/sclera: Conjunctivae normal.     Pupils: Pupils are equal, round, and reactive to light.     Comments: No nystagmus  Cardiovascular:     Rate and Rhythm: Normal rate and regular rhythm.  Pulmonary:     Effort: Pulmonary effort is normal.     Breath sounds: Normal  breath sounds.  Abdominal:     General: Abdomen is flat.     Palpations: Abdomen is soft.     Tenderness: There is no abdominal tenderness.  Musculoskeletal:        General: Normal range of motion.     Cervical back: Normal range of motion and neck supple.     Right lower leg: No edema.     Left lower leg: No edema.  Skin:    General: Skin is warm and dry.  Neurological:     Mental Status: He is alert and oriented to person, place, and time.     Comments: No facial asymmetry     ED Results / Procedures / Treatments   Labs (all labs ordered are listed, but only abnormal results are displayed) Labs Reviewed  DIFFERENTIAL - Abnormal; Notable for the following components:      Result Value   Lymphs Abs 0.6 (*)    Abs Immature Granulocytes 0.08 (*)    All other components within normal limits  COMPREHENSIVE METABOLIC PANEL - Abnormal; Notable for the following components:   CO2 20 (*)    Glucose, Bld 106 (*)    BUN 32 (*)    Creatinine, Ser 1.92 (*)    Total Protein 6.2 (*)    GFR, Estimated 39 (*)    All other components within normal limits  URINALYSIS, ROUTINE W REFLEX MICROSCOPIC - Abnormal; Notable for the following components:   APPearance HAZY (*)    Hgb urine dipstick SMALL (*)    Protein, ur 100 (*)    Leukocytes,Ua TRACE (*)    Bacteria, UA MANY (*)    All other components within normal limits  CBG MONITORING, ED - Abnormal; Notable for the following components:    Glucose-Capillary 115 (*)    All other components within normal limits  I-STAT CHEM 8, ED - Abnormal; Notable for the following components:   BUN 32 (*)    Creatinine, Ser 1.90 (*)    Glucose, Bld 102 (*)    TCO2 21 (*)    Hemoglobin 12.9 (*)    HCT 38.0 (*)    All other components within normal limits  CBG MONITORING, ED - Abnormal; Notable for the following components:   Glucose-Capillary 59 (*)    All other components within normal limits  CBG MONITORING, ED - Abnormal; Notable for the following components:   Glucose-Capillary 68 (*)    All other components within normal limits  ETHANOL  PROTIME-INR  APTT  CBC  RAPID URINE DRUG SCREEN, HOSP PERFORMED  CBG MONITORING, ED    EKG EKG Interpretation  Date/Time:  Monday October 15 2022 16:12:56 EST Ventricular Rate:  71 PR Interval:  170 QRS Duration: 99 QT Interval:  434 QTC Calculation: 472 R Axis:   23 Text Interpretation: Sinus rhythm Anterior infarct, old Nonspecific T abnormalities, lateral leads Baseline wander in lead(s) V2 No significant change since last tracing Confirmed by Leanord Asal ([id number]) on 10/15/2022 4:30:54 PM  Radiology MR BRAIN WO CONTRAST  Result Date: 10/15/2022 CLINICAL DATA:  Initial evaluation for neuro deficit, stroke suspected. EXAM: MRI HEAD WITHOUT CONTRAST MRA HEAD WITHOUT CONTRAST MRA NECK WITHOUT CONTRAST TECHNIQUE: Multiplanar, multiecho pulse sequences of the brain and surrounding structures were obtained without intravenous contrast. Angiographic images of the Circle of Willis were obtained using MRA technique without intravenous contrast. Angiographic images of the neck were obtained using MRA technique without intravenous contrast. Carotid stenosis measurements (when applicable) are obtained utilizing NASCET  criteria, using the distal internal carotid diameter as the denominator. COMPARISON:  Prior CT from earlier the same day as well as previous studies. FINDINGS: MRI HEAD FINDINGS Brain:  Cerebral volume within normal limits. Scattered patchy T2/FLAIR hyperintensity involving the periventricular deep white matter both cerebral hemispheres as well as the pons, most consistent with chronic small vessel ischemic disease. Few scattered superimposed remote lacunar infarcts present about the hemispheric cerebral white matter. No evidence for acute or subacute ischemia. Gray-white matter differentiation maintained. No acute intracranial hemorrhage. Chronic petechial blood products noted at the left temporal occipital junction and about a few of the remote lacunar infarcts, stable. No other acute or chronic intracranial blood products. No mass lesion, midline shift or mass effect. No hydrocephalus or extra-axial fluid collection. Pituitary gland and suprasellar region within normal limits. Vascular: Right vertebral artery hypoplastic and not well seen. Major intracranial vascular flow voids are otherwise maintained. Skull and upper cervical spine: Craniocervical junction within normal limits. Bone marrow signal intensity normal. No scalp soft tissue abnormality. Sinuses/Orbits: Globes orbital soft tissues within normal limits. Mild scattered mucosal thickening noted about the ethmoidal air cells and maxillary sinuses. No mastoid effusion. Other: None. MRA HEAD FINDINGS ANTERIOR CIRCULATION: Both internal carotid arteries patent to the termini without stenosis or other abnormality. A1 segments patent bilaterally. Normal anterior communicating complex. Anterior cerebral arteries patent without stenosis. No M1 stenosis or occlusion. No proximal MCA branch occlusion or high-grade stenosis. Distal MCA branches perfused and symmetric. POSTERIOR CIRCULATION: Left vertebral artery dominant and widely patent. Left PICA patent. Right vertebral artery diminutive and largely terminates in PICA. Right V4 segment is occluded beyond the takeoff of the right PICA, stable, and consistent with a chronic finding. Basilar  patent without stenosis. Superior cerebellar and posterior cerebral arteries patent bilaterally. No intracranial aneurysm. MRA NECK FINDINGS AORTIC ARCH: Examination technically limited by extensive motion artifact and lack of IV contrast. Visualized aortic arch normal in caliber with standard 3 vessel morphology. No visible stenosis about the origin the great vessels. RIGHT CAROTID SYSTEM: Right common and internal carotid arteries are patent with antegrade flow. No evidence for dissection or occlusion. Suspected mild atheromatous irregularity about the right carotid bulb, but no visible hemodynamically significant greater than 50% stenosis. LEFT CAROTID SYSTEM: Left common and internal carotid arteries are patent with antegrade flow. No evidence for dissection. Probable mild atheromatous irregularity about the left carotid bulb, but no visible hemodynamically significant greater than 50% stenosis. VERTEBRAL ARTERIES: Both vertebral arteries arise from subclavian arteries. Left vertebral artery dominant. Neither vertebral artery origin well visualized. Dominant left vertebral artery widely patent without visible stenosis or dissection. Right vertebral artery hypoplastic with somewhat attenuated flow, but appears to be grossly patent within the neck as well. No visible stenosis or dissection IMPRESSION: MRI HEAD IMPRESSION: 1. No acute intracranial abnormality. 2. Mild chronic microvascular ischemic disease with a few scattered remote lacunar infarcts involving the hemispheric cerebral white matter. MRA HEAD IMPRESSION: 1. Negative intracranial MRA for acute large vessel occlusion or other emergent finding. No hemodynamically significant or correctable stenosis. 2. Chronic distal right V4 occlusion, stable. Dominant left vertebral artery widely patent. MRA NECK IMPRESSION: 1. Technically limited exam due to motion artifact and lack of IV contrast. 2. Mild atheromatous irregularity about the carotid bifurcations, but  no hemodynamically significant greater than 50% stenosis. 3. Gross patency of both vertebral arteries within the neck without visible stenosis. Electronically Signed   By: Jeannine Boga M.D.   On: 10/15/2022 19:27  MR ANGIO HEAD WO CONTRAST  Result Date: 10/15/2022 CLINICAL DATA:  Initial evaluation for neuro deficit, stroke suspected. EXAM: MRI HEAD WITHOUT CONTRAST MRA HEAD WITHOUT CONTRAST MRA NECK WITHOUT CONTRAST TECHNIQUE: Multiplanar, multiecho pulse sequences of the brain and surrounding structures were obtained without intravenous contrast. Angiographic images of the Circle of Willis were obtained using MRA technique without intravenous contrast. Angiographic images of the neck were obtained using MRA technique without intravenous contrast. Carotid stenosis measurements (when applicable) are obtained utilizing NASCET criteria, using the distal internal carotid diameter as the denominator. COMPARISON:  Prior CT from earlier the same day as well as previous studies. FINDINGS: MRI HEAD FINDINGS Brain: Cerebral volume within normal limits. Scattered patchy T2/FLAIR hyperintensity involving the periventricular deep white matter both cerebral hemispheres as well as the pons, most consistent with chronic small vessel ischemic disease. Few scattered superimposed remote lacunar infarcts present about the hemispheric cerebral white matter. No evidence for acute or subacute ischemia. Gray-white matter differentiation maintained. No acute intracranial hemorrhage. Chronic petechial blood products noted at the left temporal occipital junction and about a few of the remote lacunar infarcts, stable. No other acute or chronic intracranial blood products. No mass lesion, midline shift or mass effect. No hydrocephalus or extra-axial fluid collection. Pituitary gland and suprasellar region within normal limits. Vascular: Right vertebral artery hypoplastic and not well seen. Major intracranial vascular flow voids are  otherwise maintained. Skull and upper cervical spine: Craniocervical junction within normal limits. Bone marrow signal intensity normal. No scalp soft tissue abnormality. Sinuses/Orbits: Globes orbital soft tissues within normal limits. Mild scattered mucosal thickening noted about the ethmoidal air cells and maxillary sinuses. No mastoid effusion. Other: None. MRA HEAD FINDINGS ANTERIOR CIRCULATION: Both internal carotid arteries patent to the termini without stenosis or other abnormality. A1 segments patent bilaterally. Normal anterior communicating complex. Anterior cerebral arteries patent without stenosis. No M1 stenosis or occlusion. No proximal MCA branch occlusion or high-grade stenosis. Distal MCA branches perfused and symmetric. POSTERIOR CIRCULATION: Left vertebral artery dominant and widely patent. Left PICA patent. Right vertebral artery diminutive and largely terminates in PICA. Right V4 segment is occluded beyond the takeoff of the right PICA, stable, and consistent with a chronic finding. Basilar patent without stenosis. Superior cerebellar and posterior cerebral arteries patent bilaterally. No intracranial aneurysm. MRA NECK FINDINGS AORTIC ARCH: Examination technically limited by extensive motion artifact and lack of IV contrast. Visualized aortic arch normal in caliber with standard 3 vessel morphology. No visible stenosis about the origin the great vessels. RIGHT CAROTID SYSTEM: Right common and internal carotid arteries are patent with antegrade flow. No evidence for dissection or occlusion. Suspected mild atheromatous irregularity about the right carotid bulb, but no visible hemodynamically significant greater than 50% stenosis. LEFT CAROTID SYSTEM: Left common and internal carotid arteries are patent with antegrade flow. No evidence for dissection. Probable mild atheromatous irregularity about the left carotid bulb, but no visible hemodynamically significant greater than 50% stenosis.  VERTEBRAL ARTERIES: Both vertebral arteries arise from subclavian arteries. Left vertebral artery dominant. Neither vertebral artery origin well visualized. Dominant left vertebral artery widely patent without visible stenosis or dissection. Right vertebral artery hypoplastic with somewhat attenuated flow, but appears to be grossly patent within the neck as well. No visible stenosis or dissection IMPRESSION: MRI HEAD IMPRESSION: 1. No acute intracranial abnormality. 2. Mild chronic microvascular ischemic disease with a few scattered remote lacunar infarcts involving the hemispheric cerebral white matter. MRA HEAD IMPRESSION: 1. Negative intracranial MRA for acute large vessel occlusion  or other emergent finding. No hemodynamically significant or correctable stenosis. 2. Chronic distal right V4 occlusion, stable. Dominant left vertebral artery widely patent. MRA NECK IMPRESSION: 1. Technically limited exam due to motion artifact and lack of IV contrast. 2. Mild atheromatous irregularity about the carotid bifurcations, but no hemodynamically significant greater than 50% stenosis. 3. Gross patency of both vertebral arteries within the neck without visible stenosis. Electronically Signed   By: Jeannine Boga M.D.   On: 10/15/2022 19:27   MR ANGIO NECK WO CONTRAST  Result Date: 10/15/2022 CLINICAL DATA:  Initial evaluation for neuro deficit, stroke suspected. EXAM: MRI HEAD WITHOUT CONTRAST MRA HEAD WITHOUT CONTRAST MRA NECK WITHOUT CONTRAST TECHNIQUE: Multiplanar, multiecho pulse sequences of the brain and surrounding structures were obtained without intravenous contrast. Angiographic images of the Circle of Willis were obtained using MRA technique without intravenous contrast. Angiographic images of the neck were obtained using MRA technique without intravenous contrast. Carotid stenosis measurements (when applicable) are obtained utilizing NASCET criteria, using the distal internal carotid diameter as the  denominator. COMPARISON:  Prior CT from earlier the same day as well as previous studies. FINDINGS: MRI HEAD FINDINGS Brain: Cerebral volume within normal limits. Scattered patchy T2/FLAIR hyperintensity involving the periventricular deep white matter both cerebral hemispheres as well as the pons, most consistent with chronic small vessel ischemic disease. Few scattered superimposed remote lacunar infarcts present about the hemispheric cerebral white matter. No evidence for acute or subacute ischemia. Gray-white matter differentiation maintained. No acute intracranial hemorrhage. Chronic petechial blood products noted at the left temporal occipital junction and about a few of the remote lacunar infarcts, stable. No other acute or chronic intracranial blood products. No mass lesion, midline shift or mass effect. No hydrocephalus or extra-axial fluid collection. Pituitary gland and suprasellar region within normal limits. Vascular: Right vertebral artery hypoplastic and not well seen. Major intracranial vascular flow voids are otherwise maintained. Skull and upper cervical spine: Craniocervical junction within normal limits. Bone marrow signal intensity normal. No scalp soft tissue abnormality. Sinuses/Orbits: Globes orbital soft tissues within normal limits. Mild scattered mucosal thickening noted about the ethmoidal air cells and maxillary sinuses. No mastoid effusion. Other: None. MRA HEAD FINDINGS ANTERIOR CIRCULATION: Both internal carotid arteries patent to the termini without stenosis or other abnormality. A1 segments patent bilaterally. Normal anterior communicating complex. Anterior cerebral arteries patent without stenosis. No M1 stenosis or occlusion. No proximal MCA branch occlusion or high-grade stenosis. Distal MCA branches perfused and symmetric. POSTERIOR CIRCULATION: Left vertebral artery dominant and widely patent. Left PICA patent. Right vertebral artery diminutive and largely terminates in PICA.  Right V4 segment is occluded beyond the takeoff of the right PICA, stable, and consistent with a chronic finding. Basilar patent without stenosis. Superior cerebellar and posterior cerebral arteries patent bilaterally. No intracranial aneurysm. MRA NECK FINDINGS AORTIC ARCH: Examination technically limited by extensive motion artifact and lack of IV contrast. Visualized aortic arch normal in caliber with standard 3 vessel morphology. No visible stenosis about the origin the great vessels. RIGHT CAROTID SYSTEM: Right common and internal carotid arteries are patent with antegrade flow. No evidence for dissection or occlusion. Suspected mild atheromatous irregularity about the right carotid bulb, but no visible hemodynamically significant greater than 50% stenosis. LEFT CAROTID SYSTEM: Left common and internal carotid arteries are patent with antegrade flow. No evidence for dissection. Probable mild atheromatous irregularity about the left carotid bulb, but no visible hemodynamically significant greater than 50% stenosis. VERTEBRAL ARTERIES: Both vertebral arteries arise from subclavian arteries. Left  vertebral artery dominant. Neither vertebral artery origin well visualized. Dominant left vertebral artery widely patent without visible stenosis or dissection. Right vertebral artery hypoplastic with somewhat attenuated flow, but appears to be grossly patent within the neck as well. No visible stenosis or dissection IMPRESSION: MRI HEAD IMPRESSION: 1. No acute intracranial abnormality. 2. Mild chronic microvascular ischemic disease with a few scattered remote lacunar infarcts involving the hemispheric cerebral white matter. MRA HEAD IMPRESSION: 1. Negative intracranial MRA for acute large vessel occlusion or other emergent finding. No hemodynamically significant or correctable stenosis. 2. Chronic distal right V4 occlusion, stable. Dominant left vertebral artery widely patent. MRA NECK IMPRESSION: 1. Technically limited  exam due to motion artifact and lack of IV contrast. 2. Mild atheromatous irregularity about the carotid bifurcations, but no hemodynamically significant greater than 50% stenosis. 3. Gross patency of both vertebral arteries within the neck without visible stenosis. Electronically Signed   By: Jeannine Boga M.D.   On: 10/15/2022 19:27   CT HEAD CODE STROKE WO CONTRAST  Result Date: 10/15/2022 CLINICAL DATA:  Code stroke.  Neuro deficit, acute, stroke suspected EXAM: CT HEAD WITHOUT CONTRAST TECHNIQUE: Contiguous axial images were obtained from the base of the skull through the vertex without intravenous contrast. RADIATION DOSE REDUCTION: This exam was performed according to the departmental dose-optimization program which includes automated exposure control, adjustment of the mA and/or kV according to patient size and/or use of iterative reconstruction technique. COMPARISON:  CT head 05/02/2019. FINDINGS: Brain: No evidence of acute large vascular territory infarction, hemorrhage, hydrocephalus, extra-axial collection or mass lesion/mass effect. Patchy white matter hypodensities, nonspecific but compatible with chronic microvascular ischemic disease. Vascular: No hyperdense vessel identified. Calcific atherosclerosis. Skull: No acute fracture. Sinuses/Orbits: Clear sinuses.  No acute orbital findings. Other: No mastoid effusions. ASPECTS [facility] Stroke Program Early CT Score) a t a otal score (0-10 with 10 being normal): 10. IMPRESSION: 1. No evidence of acute intracranial abnormality. 2. ASPECTS is 10. Code stroke imaging results were communicated on 10/15/2022 at 4:42 pm to provider Dr. Leonel Ramsay via secure text paging. Electronically Signed   By: Margaretha Sheffield M.D.   On: 10/15/2022 16:42    Procedures Procedures    Medications Ordered in ED Medications  acetaminophen (TYLENOL) tablet 650 mg (650 mg Oral Given 10/15/22 2046)  sulfamethoxazole-trimethoprim (BACTRIM DS) 800-160 MG per tablet 1  tablet (1 tablet Oral Given 10/15/22 2241)    ED Course/ Medical Decision Making/ A&P Clinical Course as of 10/15/22 2251  Mon Oct 15, 2022  1719 Neurology recommends MRI. If negative can be discharged with outpatient follow up. [VK]  2240 Glucose has improved to 78 and patient reports significant improvement of his symptoms. He does have a UTI and will be treated with bactrim. Patient prefers discharge home. [VK]    Clinical Course User Index [VK] Kemper Durie, DO                           Medical Decision Making This patient presents to the ED with chief complaint(s) of R-sided numbness, dizziness with pertinent past medical history of TIA/CVA, HTN, ESRD s/p kidney transplant, DM which further complicates the presenting complaint. The complaint involves an extensive differential diagnosis and also carries with it a high risk of complications and morbidity.    The differential diagnosis includes hypo or hyperglycemia, CVA, TIA, electrolyte abnormality, dehydration, infection  Additional history obtained: Additional history obtained from N/A Records reviewed outpatient neurology records  ED  Course and Reassessment: Upon patient's arrival to the emergency department he was found to have decreased sensation on his right side with a drift in his right upper extremity and right lower extremity.  His last known well was 9:30 AM.  Patient was made a stroke alert and was immediately transferred to [place].  He was evaluated by neurology at bedside who states that due to his low NIH score and onset of symptoms is not a tPA or thrombectomy candidate.  He recommended MRI/MRA and if negative unlikely acute neurologic event to cause his symptoms.  Patient did to have hypoglycemia at home and will continue to have workup for cause of his hypoglycemia that may be contributing to his symptoms.  Independent labs interpretation:  The following labs were independently interpreted: Hypoglycemia, UA  positive for UTI  Independent visualization of imaging: - I independently visualized the following imaging with scope of interpretation limited to determining acute life threatening conditions related to emergency care: CT head/MRI brain, which revealed no acute disease  Consultation: - Consulted or discussed management/test interpretation w/ external professional: Neurology  Consideration for admission or further workup: Considered admission for hypoglycemia in the setting of UTI, however using shared decision making with the patient, he would prefer discharge home and has close follow-up Social Determinants of health: N/A    Amount and/or Complexity of Data Reviewed Labs: ordered. Radiology: ordered.  Risk OTC drugs. Prescription drug management.          Final Clinical Impression(s) / ED Diagnoses Final diagnoses:  Acute cystitis without hematuria  Hypoglycemia  Paresthesias    Rx / DC Orders ED Discharge Orders          Ordered    sulfamethoxazole-trimethoprim (BACTRIM DS) 800-160 MG tablet  2 times daily        10/15/22 2246              Kemper Durie, DO 10/15/22 2251

## 2022-10-15 NOTE — ED Notes (Signed)
Pt reports some weakness to the R side and some decreased sensation in his R face, states that this was a sudden onset at 930 am, states that the broken speech happens off and on and has been going on for a week.

## 2022-10-15 NOTE — ED Notes (Signed)
Pt remains in MRI 

## 2022-10-15 NOTE — ED Triage Notes (Signed)
Pt to er via ems, per ems pt is here for some dizziness and low blood sugar, states that he has had two episodes when his sugar was in the 50s, states that he had some dr pepper and his sugar is now in the 90s,  states that pt also had some tingling in his L face, pt states that he had a similar feeling after his open heart.  Pt awake and oriented times three. Pt reports some decreased sensation in his R face, states that he has had this for the past year.

## 2022-10-15 NOTE — ED Notes (Signed)
Pt to MRI

## 2022-10-15 NOTE — Discharge Instructions (Addendum)
You were seen in the emergency department for your right-sided numbness and weakness.  Your workup showed no signs of a new stroke, however you did have a urinary tract infection and your blood sugars were running low likely due to the infection.  This likely worsened your previous stroke symptoms.  This will be treated with antibiotics and you should complete this as prescribed.  You should make sure that you are eating regularly and checking your blood sugar regularly over the next few days to make sure that it does not continue to drop with your infection.  You should follow-up with your primary doctor to have your symptoms rechecked and can follow-up with your urologist and neurologist as needed as well.  You should return to the emergency department if you are having recurrent episodes of hypoglycemia, worsening abdominal or back pain, fevers despite the antibiotics, numbness or weakness worsening on one side of the body compared to the other or if you have any other new or concerning symptoms.

## 2022-10-15 NOTE — ED Notes (Signed)
Code stroke called

## 2022-10-15 NOTE — Code Documentation (Signed)
Patient from home via [facility] with "word-finding" difficulties that come and go for the past several months as well as dizziness. Today at 0930 he had new onset of tingling to right side of face and limbs in addition to these symptoms. Pt with NIHSS 3 for drift in upper and lower right side as well as sensory deficit. Pt outside window for TNK and no LVO suspected. Pt is AAOx4, follows commands. Care Plan discussed with Darnelle Maffucci, RN. Q2x12 NIHSS/vitals, MRI, permissive HTN.   Margarette Asal, RN  [phone]

## 2022-10-15 NOTE — Consult Note (Signed)
Neurology Consultation Reason for Consult: Right facial numbness Referring Physician: Sharman Crate  CC: Right facial numbness  History is obtained from: Patient  HPI: Robert [name] is a 62 y.o. male with a history of headaches, stroke with right-sided numbness and weakness that he has since rehabbed from, who presents with right facial numbness that started at 930 this morning.  He also has difficulty with speech which is a waxing and waning problem and he attributes it to his previous stroke coupled with slight worsening in the setting of increasing zonisamide dosing.  This problem he states has been there for years, but the numbness is new.  Given concerns for speech and focal numbness/weakness, he was considered van positive and a code stroke was activated.   Of note, he had the "flu" this past weekend and has been slightly nervous because he is retiring tomorrow.  LKW: 9:30 AM tnk given?: no, anticoagulated   Past Medical History:  Diagnosis Date   Acute cystitis without hematuria 11/09/2021   AKI (acute kidney injury) 08/08/2017   Elevated sCr to 2.5 (baseline ~1.7) noted at OSH - Subsequent transfer to [facility]  - Currently on IVMF in the setting of diarrhea and AKI - sCr at baseline at time of discharge   Anemia in chronic kidney disease 03/30/2015   Anticoagulated on Coumadin 11/09/2021   Aortic valve stenosis    Arthritis    Atrial fibrillation 11/29/2021   Benign essential hypertension 07/30/2014   prior to transplant pt took Metorpolol tartrate '50mg'$  qd on off HD days   Benign neoplasm of transverse colon    Cancer    skin ca right shoulder, plastic dsyplasia, pre-Ca polpys removed on Colonoscopy- 07/2014   Cerebrovascular accident (CVA) due to embolism of precerebral artery    Chronic renal failure    Colon polyps 07/23/2014   Tubular adenomas x 5   Combined arterial insufficiency and corporo-venous occlusive erectile dysfunction 12/28/2019   Constipation    Coronary  artery disease    Diabetic neuropathy 03/01/2017   Difficult intubation    unsure of actual problem but it was during the January 28, 2015 procedure.   Diverticulosis of colon without hemorrhage    Eczema    ESRD (end stage renal disease)    hemodialysis 06/2014-02/2017, s/p living unrelated kidney transplant 02/26/17   Exertional dyspnea    Fatigue 01/08/2014   Fissure of skin 03/08/2020   GI bleed 07/26/2017   Headache    Heart murmur    aortic stenosis (moderate-severe 04/2021)   History of COVID-19 2023   History of unilateral nephrectomy 09/06/2013   Hydrocele, acquired 04/07/2017   Hyperlipidemia 09/06/2013   Hypertensive urgency 10/21/2021   Hyponatremia 11/09/2021   Hypothyroidism    Immunosuppressed status 02/26/2017   Induction agent: Solumedrol - Envarsus '3mg'$  daily  - Home dose decreased from '5mg'$  to '3mg'$  in the setting of FK 15.7 while inpatient   - Previously on Prograf, though d/c'ed during previous admission; suspected this may be causing hallucinations  - Cellcept '1000mg'$  Q12H  - Prednisone '5mg'$  daily   Inguinal hernia without obstruction or gangrene 06/30/2017   Right inguinal wall defect appreciated by CT imaging [place] and on exam Dr. Paulla Fore today Dr. Paulla Fore has discussed hernia repair with mesh at some point in near future  Last Assessment & Plan:  Formatting of this note might be different from the original. Right inguinal wall defect appreciated by CT imaging [place] and on exam Dr. Paulla Fore today Dr.  Rege has discussed hern   Kidney transplanted 02/26/2017   Date of Transplant = 02/26/17  ABO (Donor/Recipient) = compatible Blood type: O+/A+ DonorType: Living donor, unrelated Allograft type:Left kidney Donor anatomy: 2 arteries, 1 veins and single ureter  Pants-to-skirt anastomosis of dual renal arteries on the backtable. Donor Kidney Bx: Not applicable Allograft injury/complications: None Ne   Lower extremity edema 09/06/2013   Macular degeneration    Mild vascular  neurocognitive disorder 02/08/2022   OAB (overactive bladder) 12/28/2019   Obstructive sleep apnea 09/06/2013   On Bi-PAP  Last Assessment & Plan:  Formatting of this note might be different from the original. - History of OSA, continue CPAP at night   Peritoneal dialysis catheter in place 02/24/2015   RLQ abdominal pain 04/19/2020   S/P AVR (aortic valve replacement) 10/05/2021   SBO (small bowel obstruction) 05/2015   Scrotal edema 03/14/2017   Testicular ultrasound did show hydrocele  Will refer to urology regarding ongoign swelling, hydrocele.   Last Assessment & Plan:  Formatting of this note might be different from the original.   Testicular ultrasound did show hydrocele  Will refer to urology regarding ongoign swelling, hydrocele.   Shortness of breath    with exertion   Stage 3b chronic kidney disease (CKD) 03/22/2014   SVT (supraventricular tachycardia) 09/06/2013   Tremor of both hands 06/29/2017   Last Assessment & Plan:  Present since transplant, stable and unchanged Query from tacrolimus -Improved tremors noted and verbalized by pt - Prograf doses titrated per FK levels  Last Assessment & Plan:  Formatting of this note might be different from the original. Present since transplant, stable and unchanged Query from tacrolimus -Improved tremors noted and verbalized by pt - Prograf doses titr   Type 2 diabetes mellitus with hyperglycemia, with long-term current use of insulin 28/41/3244   Umbilical hernia    s/p repair 04/28/15     Family History  Problem Relation Age of Onset   Colon polyps Father    Stroke Father    Dementia Father        vascular   Colon cancer Neg Hx    Stomach cancer Neg Hx      Social History:  reports that he quit smoking about 9 years ago. His smoking use included cigars. He has never used smokeless tobacco. He reports that he does not currently use alcohol. He reports that he does not use drugs.   Exam: Current vital signs: BP (!) 168/76    Pulse 67   Temp 98.4 F (36.9 C) (Oral)   Resp 19   Ht '5\' 11"'$  (1.803 m)   Wt 127 kg   SpO2 98%   BMI 39.05 kg/m  Vital signs in last 24 hours: Temp:  [98.4 F (36.9 C)] 98.4 F (36.9 C) (01/08 1606) Pulse Rate:  [67-77] 67 (01/08 1700) Resp:  [16-19] 19 (01/08 1700) BP: (160-168)/(69-76) 168/76 (01/08 1700) SpO2:  [94 %-98 %] 98 % (01/08 1700) Weight:  [010 kg] 127 kg (01/08 1607)   Physical Exam  Appears well-developed and well-nourished.   Neuro: Mental Status: Patient is awake, alert, oriented to person, place, month, year, and situation. Patient is able to give a clear and coherent history. No signs of aphasia or neglect He has slight increased latency of speech, but no clear aphasia. Cranial Nerves: II: Visual Fields are full. Pupils are equal, round, and reactive to light.   III,IV, VI: EOMI without ptosis or diploplia.  V: Facial sensation is  diminished on the right to touch VII: Facial movement is symmetric.  VIII: hearing is intact to voice X: Uvula elevates symmetrically XI: Shoulder shrug is symmetric. XII: tongue is midline without atrophy or fasciculations.  Motor: Tone is normal. Bulk is normal. 5/5 strength was present on the left, he has 4/5 strength of the right arm and leg Sensory: Sensation diminished on the right to touch Cerebellar: No definite ataxia     I have reviewed labs in epic and the results pertinent to this consultation are: Creatinine 1.92  I have reviewed the images obtained: CT head-negative  Impression: 62 year old male with right facial numbness in the setting of previous stroke with right-sided deficits and recent flu.  This is recrudescence versus new acute stroke, and I would favor an MRI to help differentiate the two.  If he does have a new stroke, then I would favor admission with secondary risk factor modification, but if negative then I do not think he would need further evaluation from a neurological  perspective.  Recommendations: 1) MRI brain 2) stroke workup only if positive.   Roland Rack, MD [facility] [phone]  If 7pm- 7am, please page neurology on call as listed in [place].

## 2022-10-15 NOTE — ED Notes (Signed)
Md notified of pt, md at bedside

## 2022-10-16 ENCOUNTER — Encounter: Payer: Self-pay | Admitting: Endocrinology

## 2022-10-16 ENCOUNTER — Encounter: Payer: Self-pay | Admitting: Neurology

## 2022-10-29 ENCOUNTER — Other Ambulatory Visit: Payer: Self-pay | Admitting: Endocrinology

## 2022-10-29 ENCOUNTER — Other Ambulatory Visit (INDEPENDENT_AMBULATORY_CARE_PROVIDER_SITE_OTHER): Payer: 59

## 2022-10-29 DIAGNOSIS — E039 Hypothyroidism, unspecified: Secondary | ICD-10-CM | POA: Diagnosis not present

## 2022-10-29 DIAGNOSIS — Z794 Long term (current) use of insulin: Secondary | ICD-10-CM

## 2022-10-29 DIAGNOSIS — E291 Testicular hypofunction: Secondary | ICD-10-CM | POA: Diagnosis not present

## 2022-10-29 DIAGNOSIS — E1165 Type 2 diabetes mellitus with hyperglycemia: Secondary | ICD-10-CM

## 2022-10-29 LAB — GLUCOSE, RANDOM: Glucose, Bld: 122 mg/dL — ABNORMAL HIGH (ref 70–99)

## 2022-10-29 LAB — TSH: TSH: 1.86 u[IU]/mL (ref 0.35–5.50)

## 2022-10-29 LAB — TESTOSTERONE: Testosterone: 270.43 ng/dL — ABNORMAL LOW (ref 300.00–890.00)

## 2022-10-30 LAB — HEMOGLOBIN A1C: Hgb A1c MFr Bld: 6.9 % — ABNORMAL HIGH (ref 4.6–6.5)

## 2022-10-30 LAB — FRUCTOSAMINE: Fructosamine: 280 umol/L (ref 0–285)

## 2022-10-31 ENCOUNTER — Encounter: Payer: Self-pay | Admitting: Endocrinology

## 2022-10-31 ENCOUNTER — Ambulatory Visit (INDEPENDENT_AMBULATORY_CARE_PROVIDER_SITE_OTHER): Payer: 59 | Admitting: Endocrinology

## 2022-10-31 VITALS — BP 108/62 | HR 62 | Ht 71.0 in | Wt 278.0 lb

## 2022-10-31 DIAGNOSIS — E039 Hypothyroidism, unspecified: Secondary | ICD-10-CM

## 2022-10-31 DIAGNOSIS — Z794 Long term (current) use of insulin: Secondary | ICD-10-CM

## 2022-10-31 DIAGNOSIS — E1165 Type 2 diabetes mellitus with hyperglycemia: Secondary | ICD-10-CM | POA: Diagnosis not present

## 2022-10-31 DIAGNOSIS — E291 Testicular hypofunction: Secondary | ICD-10-CM | POA: Diagnosis not present

## 2022-10-31 NOTE — Patient Instructions (Signed)
Take max 10-12 Novolog for hi carb meals

## 2022-10-31 NOTE — Progress Notes (Signed)
Patient ID: Robert [name], male   DOB: 07-Jun-1961, 62 y.o.   MRN: [id number]     Reason for Appointment: Endocrinology follow-up  History of Present Illness   DIABETES diagnosis date:  2012  Previous history: His blood sugar has been difficult to control since onset. Initially was taking oral hypoglycemic drugs like glyburide but could not take metformin because of renal dysfunction. Since Victoza did not help his control he was started on Lantus and then U 500 insulin also His A1c has previously ranged from 7.8-9.8, the lowest level in 03/2013 He was on Victoza but because of his markedly decreased appetite and weight loss this was stopped in 9/15  Recent history:   Non-hypoglycemic insulin regimen: None  OMNIPOD pump settings with U-500 insulin:   Midnight = 0.05, 6 AM = 2.2, 11 AM = 1.4 and 5 PM = 0.20 Bolus settings: Insulin to carb ratio 1:15 Sensitivity or ISF 45 instead of 60  Carbohydrate ratio 1:10, correction factor I: 60  Bolus insulin with NovoLog FlexPen variable doses 10-15  His A1c is 6.9, improved Fructosamine previously 295  Current blood sugar patterns, management and problems identified:  He has been only infrequently trying to use NovoLog for mealtime coverage On 1 occasion he had this for his late breakfast but blood sugar subsequently with 15 units was low normal Most of the time he is not taking any boluses except when blood sugars are significantly high He will likely have some high readings at various times based on his carbohydrate intake including late at night Occasionally he is having slightly low blood sugars midday but not in the last week Unable to verify whether some of the low sugars were related to boluses since these are not entered in the pump Again he may not wait at least 30 minutes before the meal to do his bolus with the pump on the Humulin R Overnight blood sugars are mostly high Currently time in range is slightly  better than the last visit   Proper timing of medications in relation to meals: Yes.          Blood sugar data analysis from freestyle libre version 3 for the last 2 weeks:  His blood sugars are on an average fairly stable at any given time ranging from average 140 up to about 160 including overnight Overnight blood sugars are generally average in the 160s but somewhat lower early morning, no hypoglycemia overnight He has inconsistent periods of HYPERGLYCEMIA at all different times including overnight and these may last a few hours occasionally especially in the last 4 days  Hypoglycemia has been mild and only on 3 occasions about 8 to 9 days ago at noon  Has no consistent mealtime patterns with only mild hyperglycemia including after lunch yesterday   CGM use % of time   2-week average/GV 151  Time in range        75%  % Time Above 180 24  % Time above 250   % Time Below 70 1     PRE-MEAL Fasting Lunch Dinner Bedtime Overall  Glucose range:       Averages: 146    151   POST-MEAL PC Breakfast PC Lunch PC Dinner  Glucose range:     Averages:  144 155   Prior:  CGM use % of time   2-week average/GV 144/43  Time in range      67%  Over 180/250 70/7     %  Time Below 70 9      Meals: 2-3 meals per day. 7-8 am and Noon and Dinner 5-7 PM .              Weight control:   Wt Readings from Last 3 Encounters:  10/31/22 278 lb (126.1 kg)  10/15/22 280 lb (127 kg)  08/08/22 276 lb (696.2 kg)        Complications:   nephropathy, neuropathy   Diabetes labs:   Lab Results  Component Value Date   HGBA1C 6.9 (H) 10/29/2022   HGBA1C 7.1 (H) 07/10/2022   HGBA1C 7.4 (H) 05/02/2022   Lab Results  Component Value Date   MICROALBUR 146.3 (H) 08/03/2019   LDLCALC 55 05/02/2022   CREATININE 1.90 (H) 10/15/2022    Lab Results  Component Value Date   FRUCTOSAMINE 280 10/29/2022   FRUCTOSAMINE 295 (H) 11/28/2021   FRUCTOSAMINE 296 (H) 07/04/2021   Lab Results  Component  Value Date   HGB 12.9 (L) 10/15/2022       OTHER problems: See review of systems    Allergies as of 10/31/2022   No Known Allergies      Medication List        Accurate as of October 31, 2022 10:14 AM. If you have any questions, ask your nurse or doctor.          acetaminophen 325 MG tablet Commonly known as: TYLENOL Take 2 tablets (650 mg total) by mouth every 4 (four) hours as needed for moderate pain.   amLODipine 5 MG tablet Commonly known as: NORVASC Take 1.5 tablets (7.5 mg total) by mouth daily.   apixaban 5 MG Tabs tablet Commonly known as: ELIQUIS Take 1 tablet (5 mg total) by mouth 2 (two) times daily. Stop coumadin, monitor the PT/INR, and start Eliquis when the INR is <2.   B-D UF III MINI PEN NEEDLES 31G X 5 MM Misc Generic drug: Insulin Pen Needle USE TO INJECT INSULIN 4 TIMES A DAY   BD Hypodermic Needle 18G X 1" Misc Generic drug: NEEDLE (DISP) 18 G USE AS DIRECTED TO INJECT 0.6MLS OF TESTOSTERONE EVERY 7 DAYS (DRAW UP)   BD Plastipak Syringe 21G X 1" 3 ML Misc Generic drug: SYRINGE-NEEDLE (DISP) 3 ML USE AS DIRECTED TO INJECT 0.6MLS OF TESTOSTERONE EVERY 7 DAYS (INJECT)   carvedilol 25 MG tablet Commonly known as: COREG Take 1 tablet (25 mg total) by mouth 2 (two) times daily with a meal.   chlorthalidone 25 MG tablet Commonly known as: HYGROTON Take 1 tablet by mouth daily.   doxazosin 2 MG tablet Commonly known as: CARDURA TAKE 0.5 TABLETS (1 MG TOTAL) BY MOUTH DAILY. TAKE 0.5 TABLET DAILY   FreeStyle Libre 3 Sensor Misc SMARTSIG:1 Topical Every 2 Weeks   gabapentin 100 MG capsule Commonly known as: NEURONTIN Take 200-300 mg by mouth See admin instructions. Taking 200 mg in the AM and 300 mg in the evening   HUMULIN R 500 UNIT/ML injection Generic drug: insulin regular human CONCENTRATED Inject 125 Units into the skin every 3 (three) days. Omnipod   hydrALAZINE 100 MG tablet Commonly known as: APRESOLINE TAKE 1 TABLET BY  MOUTH EVERY 8 HOURS What changed:  when to take this additional instructions   insulin aspart 100 UNIT/ML injection Commonly known as: novoLOG Inject 5-20 Units into the skin daily as needed for high blood sugar.   levothyroxine 112 MCG tablet Commonly known as: SYNTHROID Take 1 tablet (112 mcg total) by mouth  daily.   mycophenolate 250 MG capsule Commonly known as: CELLCEPT Take 1,000 mg by mouth every 12 (twelve) hours.   nitroGLYCERIN 0.4 MG SL tablet Commonly known as: NITROSTAT Place 0.4 mg under the tongue every 5 (five) minutes as needed for chest pain.   NON FORMULARY CPAP/BIPAP  at bedtime  BIPAP per pt   Nulojix 250 MG Solr injection Generic drug: belatacept Inject 24.5 mLs into the vein every 28 (twenty-eight) days.   predniSONE 5 MG tablet Commonly known as: DELTASONE Take 5 mg by mouth daily with breakfast. Continuously   rosuvastatin 20 MG tablet Commonly known as: Crestor Take 1 tablet (20 mg total) by mouth daily.   senna-docusate 8.6-50 MG tablet Commonly known as: Senokot-S Take 1 tablet by mouth at bedtime as needed for mild constipation. What changed: when to take this   Syringe (Disposable) 1 ML Misc Use to draw up 0.12m of testosterone medication   tamsulosin 0.4 MG Caps capsule Commonly known as: FLOMAX Take 0.8 mg by mouth daily after supper.   Testosterone Cypionate 200 MG/ML Soln Inject 120 mg as directed once a week. Saturday   zolpidem 10 MG tablet Commonly known as: AMBIEN Take 10 mg by mouth at bedtime as needed for sleep.   zonisamide 25 MG capsule Commonly known as: ZONEGRAN Take 3 capsules (75 mg total) by mouth daily.        Allergies: No Known Allergies  Past Medical History:  Diagnosis Date   Acute cystitis without hematuria 11/09/2021   AKI (acute kidney injury) 08/08/2017   Elevated sCr to 2.5 (baseline ~1.7) noted at OSH - [facility] Currently on IVMF in the setting of diarrhea and AKI -  sCr at baseline at time of discharge   Anemia in chronic kidney disease 03/30/2015   Anticoagulated on Coumadin 11/09/2021   Aortic valve stenosis    Arthritis    Atrial fibrillation 11/29/2021   Benign essential hypertension 07/30/2014   prior to transplant pt took Metorpolol tartrate '50mg'$  qd on off HD days   Benign neoplasm of transverse colon    Cancer    skin ca right shoulder, plastic dsyplasia, pre-Ca polpys removed on Colonoscopy- 07/2014   Cerebrovascular accident (CVA) due to embolism of precerebral artery    Chronic renal failure    Colon polyps 07/23/2014   Tubular adenomas x 5   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   Diverticulosis of colon without hemorrhage    Eczema    ESRD (end stage renal disease)    hemodialysis 06/2014-02/2017, s/p living unrelated kidney transplant 02/26/17   Exertional dyspnea    Fatigue 01/08/2014   Fissure of skin 03/08/2020   GI bleed 07/26/2017   Headache    Heart murmur    aortic stenosis (moderate-severe 04/2021)   History of COVID-19 2023   History of unilateral nephrectomy 09/06/2013   Hydrocele, acquired 04/07/2017   Hyperlipidemia 09/06/2013   Hypertensive urgency 10/21/2021   Hyponatremia 11/09/2021   Hypothyroidism    Immunosuppressed status 02/26/2017   Induction agent: Solumedrol - Envarsus '3mg'$  daily  - Home dose decreased from '5mg'$  to '3mg'$  in the setting of FK 15.7 while inpatient   - Previously on Prograf, though d/c'ed during previous admission; suspected this may be  causing hallucinations  - Cellcept '1000mg'$  Q12H  - Prednisone '5mg'$  daily   Inguinal hernia without obstruction or gangrene 06/30/2017   Right inguinal wall defect appreciated by CT imaging [place] and on exam Dr. Paulla Fore today Dr. Paulla Fore  has discussed hernia repair with mesh at some point in near future  Last Assessment & Plan:  Formatting of this note might be different from the original. Right inguinal wall defect appreciated by CT imaging [place] and on exam Dr. Paulla Fore today Dr. Paulla Fore has discussed hern   Kidney transplanted 02/26/2017   Date of Transplant = 02/26/17  ABO (Donor/Recipient) = compatible Blood type: O+/A+ DonorType: Living donor, unrelated Allograft type:Left kidney Donor anatomy: 2 arteries, 1 veins and single ureter  Pants-to-skirt anastomosis of dual renal arteries on the backtable. Donor Kidney Bx: Not applicable Allograft injury/complications: None Ne   Lower extremity edema 09/06/2013   Macular degeneration    Mild vascular neurocognitive disorder 02/08/2022   OAB (overactive bladder) 12/28/2019   Obstructive sleep apnea 09/06/2013   On Bi-PAP  Last Assessment & Plan:  Formatting of this note might be different from the original. - History of OSA, continue CPAP at night   Peritoneal dialysis catheter in place 02/24/2015   RLQ abdominal pain 04/19/2020   S/P AVR (aortic valve replacement) 10/05/2021   SBO (small bowel obstruction) 05/2015   Scrotal edema 03/14/2017   Testicular ultrasound did show hydrocele  Will refer to urology regarding ongoign swelling, hydrocele.   Last Assessment & Plan:  Formatting of this note might be different from the original.   Testicular ultrasound did show hydrocele  Will refer to urology regarding ongoign swelling, hydrocele.   Shortness of breath    with exertion   Stage 3b chronic kidney disease (CKD) 03/22/2014   SVT (supraventricular tachycardia) 09/06/2013   Tremor of both hands 06/29/2017   Last Assessment & Plan:  Present since transplant, stable and unchanged Query from tacrolimus -Improved tremors noted and verbalized by pt - Prograf doses titrated per FK levels  Last Assessment & Plan:  Formatting of this note might be different from the original. Present since  transplant, stable and unchanged Query from tacrolimus -Improved tremors noted and verbalized by pt - Prograf doses titr   Type 2 diabetes mellitus with hyperglycemia, with long-term current use of insulin 25/02/3975   Umbilical hernia    s/p repair 04/28/15    Past Surgical History:  Procedure Laterality Date   AORTIC VALVE REPLACEMENT N/A 10/05/2021   Procedure: AORTIC VALVE REPLACEMENT (AVR) WITH INSPIRIS RESILIA AORTIC VALVE SIZE 27MM;  Surgeon: [name], MD;  Location: [place];  Service: Open Heart Surgery;  Laterality: N/A;   AV FISTULA PLACEMENT Right 02/25/2015   Procedure: RIGHT ARTERIOVENOUS (AV) FISTULA CREATION;  Surgeon: Serafina Mitchell, MD;  Location: [place];  Service: Vascular;  Laterality: Right;   AV FISTULA PLACEMENT Right 09/15/2015   Procedure: ARTERIOVENOUS (AV) FISTULA CREATION- RIGHT ARM;  Surgeon: Serafina Mitchell, MD;  Location: [place];  Service: Vascular;  Laterality: Right;   CAPD INSERTION N/A 05/18/2014   Procedure: LAPAROSCOPIC INSERTION CONTINUOUS AMBULATORY PERITONEAL DIALYSIS  (CAPD) CATHETER;  Surgeon: Ralene Ok, MD;  Location: [place];  Service: General;  Laterality: N/A;   COLONOSCOPY     COLONOSCOPY WITH PROPOFOL N/A 07/12/2016   Procedure: COLONOSCOPY WITH PROPOFOL;  Surgeon: [name], MD;  Location: [facility];  Service: Endoscopy;  Laterality: N/A;   CORONARY ARTERY BYPASS GRAFT N/A 10/05/2021  Procedure: CORONARY ARTERY BYPASS GRAFTING (CABG) X 1, ON PUMP, USING LEFT INTERNAL MAMMARY ARTERY CONDUIT;  Surgeon: [name], MD;  Location: [place];  Service: Open Heart Surgery;  Laterality: N/A;   declotting of fistula  08/2015   ESOPHAGOGASTRODUODENOSCOPY (EGD) WITH PROPOFOL N/A 07/12/2016   Procedure: ESOPHAGOGASTRODUODENOSCOPY (EGD) WITH PROPOFOL;  Surgeon: [name], MD;  Location: [facility];  Service: Endoscopy;  Laterality: N/A;   FISTULA SUPERFICIALIZATION Right 02/07/2016   Procedure: RIIGHT UPPER ARM FISTULA  SUPERFICIALIZATION;  Surgeon: Elam Dutch, MD;  Location: [place];  Service: Vascular;  Laterality: Right;   IJ catheter insertion     INSERTION OF DIALYSIS CATHETER Right 02/25/2015   Procedure: INSERTION OF RIGHT INTERNAL JUGULAR DIALYSIS CATHETER;  Surgeon: Serafina Mitchell, MD;  Location: [place] [facility];  Service: Vascular;  Laterality: Right;   KIDNEY TRANSPLANT     LAPAROSCOPIC REPOSITIONING CAPD CATHETER N/A 06/16/2014   Procedure: LAPAROSCOPIC REPOSITIONING CAPD CATHETER;  Surgeon: Ralene Ok, MD;  Location: [place];  Service: General;  Laterality: N/A;   LAPAROSCOPY N/A 05/04/2015   Procedure: LAPAROSCOPY DIAGNOSTIC LYSIS OF ADHESIONS;  Surgeon: Coralie Keens, MD;  Location: [place];  Service: General;  Laterality: N/A;   MINOR REMOVAL OF PERITONEAL DIALYSIS CATHETER N/A 04/28/2015   Procedure:  REMOVAL OF PERITONEAL DIALYSIS CATHETER;  Surgeon: Coralie Keens, MD;  Location: [place];  Service: General;  Laterality: N/A;   NEPHRECTOMY Left 1974   RENAL BIOPSY Right 2012   REVISON OF ARTERIOVENOUS FISTULA Right 05/26/2015   Procedure: SUPERFICIALIZATION OF ARTERIOVENOUS FISTULA WITH SIDE BRANCH LIGATIONS;  Surgeon: Serafina Mitchell, MD;  Location: [facility];  Service: Vascular;  Laterality: Right;   RIGHT/LEFT HEART CATH AND CORONARY ANGIOGRAPHY N/A 08/22/2021   Procedure: RIGHT/LEFT HEART CATH AND CORONARY ANGIOGRAPHY;  Surgeon: Troy Sine, MD;  Location: [place] [facility];  Service: Cardiovascular;  Laterality: N/A;   SKIN CANCER EXCISION     right shoulder   SVT ABLATION N/A 11/23/2016   Procedure: SVT Ablation;  Surgeon: Will Meredith Leeds, MD;  Location: [place] [facility];  Service: Cardiovascular;  Laterality: N/A;   TEE WITHOUT CARDIOVERSION N/A 10/05/2021   Procedure: TRANSESOPHAGEAL ECHOCARDIOGRAM (TEE);  Surgeon: [name], MD;  Location: [place];  Service: Open Heart Surgery;  Laterality: N/A;   UMBILICAL HERNIA REPAIR N/A 05/18/2014   Procedure: HERNIA REPAIR UMBILICAL ADULT;   Surgeon: Ralene Ok, MD;  Location: [place];  Service: General;  Laterality: N/A;   UMBILICAL HERNIA REPAIR N/A 04/28/2015   Procedure: UMBILICAL HERNIA REPAIR WITH MESH;  Surgeon: Coralie Keens, MD;  Location: [place];  Service: General;  Laterality: N/A;    Family History  Problem Relation Age of Onset   Colon polyps Father    Stroke Father    Dementia Father        vascular   Colon cancer Neg Hx    Stomach cancer Neg Hx     Social History:  reports that he quit smoking about 9 years ago. His smoking use included cigars. He has never used smokeless tobacco. He reports that he does not currently use alcohol. He reports that he does not use drugs.  Review of Systems:  Hypertension:    His blood pressure has been managed by his nephrologist and transplant team monitors BP at home periodically  BP Readings from Last 3 Encounters:  10/31/22 108/62  10/15/22 (!) 159/76  08/08/22 (!) 100/50    Lipids:  His LDL is below  70 with 40 mg Crestor Has no evidence of vascular disease     Lab Results  Component Value Date   CHOL 116 05/02/2022   CHOL 109 10/13/2021   CHOL 148 07/04/2021   Lab Results  Component Value Date   HDL 52 05/02/2022   HDL 31 (L) 10/13/2021   HDL 69.70 07/04/2021   Lab Results  Component Value Date   LDLCALC 55 05/02/2022   LDLCALC 65 10/13/2021   LDLCALC 67 07/04/2021   Lab Results  Component Value Date   TRIG 45 05/02/2022   TRIG 66 10/13/2021   TRIG 54.0 07/04/2021   Lab Results  Component Value Date   CHOLHDL 2.2 05/02/2022   CHOLHDL 3.5 10/13/2021   CHOLHDL 2 07/04/2021   No results found for: "LDLDIRECT"   HYPOTHYROIDISM: He has had long-standing hypothyroidism  Has been taking his 100 g levothyroxine every morning He does have quite consistent fatigue for other reasons  He has taken his levothyroxine consistently  Lab Results  Component Value Date   TSH 1.86 10/29/2022       HYPOGONADISM: He has had hypogonadotropic  hypogonadism related to his metabolic syndrome  When testosterone level was low he was having fatigue and decreased motivation and because of his  he was prescribed He has been using AndroGel since February 2019 but subsequently stopped this because of cost  Current regimen is Depo testosterone 100 mg weekly since 11/2021 Again he says that he has taken irregular testosterone injections even though he did pick up a supply of needles He does feel better when he takes the shots regularly  Pretreatment free testosterone level low at 3.7   Lab Results  Component Value Date   TESTOSTERONE 270.43 (L) 10/29/2022   TESTOSTERONE 184.05 (L) 07/10/2022   TESTOSTERONE 209.75 (L) 04/16/2022   Lab Results  Component Value Date   HGB 12.9 (L) 10/15/2022    He has depression, taking 30 mg of Cymbalta; followed by PCP  Diabetic neuropathy: Has mild sensory loss on his last exam Followed by podiatrist regularly    Examination:   BP 108/62   Pulse 62   Ht '5\' 11"'$  (1.803 m)   Wt 278 lb (126.1 kg)   SpO2 97%   BMI 38.77 kg/m   Body mass index is 38.77 kg/m.    ASSESSMENT/ PLAN:    Diabetes type 2 with obesity, insulin-requiring:   See history of present illness for detailed discussion of his current management, problems identified and glycemic patterns on his freestyle libre sensor download  Has been on the OmniPod pump with U-500 recently  A1c is 6.9 Recent GMI also 6.9  Currently he is on insulin only and appears to be doing relatively well with mostly basal insulin and minimal boluses As above his insulin pump is delivering a basal mostly between 6 AM and 5 PM However now he is starting to get overnight hyperglycemia also and on an average blood sugars are still in the 140-150 range Premeal Likely with cutting back on carbohydrates and high fat intake he is not needing much bolus  Recommendations: If he does need NovoLog for any carbohydrate rich meals he will take 10 units  instead of 50 Also will need to cover large snacks with carbohydrates regardless of when he is eating Since he prefers to use Humulin R in the pump he will continue to do so Otherwise may take boluses with Humulin R 30 to 45-minute before planned meals Basal at midnight will be  increased to 0.15, 11 AM = 1.1 and 5 p.m. = 0.2   HYPOTHYROIDISM: Continue same levothyroxine dose as TSH is consistently normal  Hypogonadism: He needs to take his testosterone injection weekly to better assess his dosage, currently having no issues with supplies   Patient Instructions  Take max 10-12 Novolog for hi carb meals     Shanetta Nicolls 10/31/2022, 10:14 AM    Note: This office note was prepared with Dragon voice recognition system technology. Any transcriptional errors that result from this process are unintentional.

## 2022-11-01 ENCOUNTER — Encounter: Payer: Self-pay | Admitting: Endocrinology

## 2022-11-12 ENCOUNTER — Encounter: Payer: Self-pay | Admitting: Neurology

## 2022-12-04 ENCOUNTER — Ambulatory Visit: Payer: 59 | Admitting: Neurology

## 2022-12-10 ENCOUNTER — Ambulatory Visit: Payer: 59 | Admitting: Neurology

## 2022-12-10 ENCOUNTER — Encounter: Payer: Self-pay | Admitting: Neurology

## 2022-12-10 VITALS — BP 117/71 | HR 76 | Ht 71.0 in | Wt 289.0 lb

## 2022-12-10 DIAGNOSIS — I6310 Cerebral infarction due to embolism of unspecified precerebral artery: Secondary | ICD-10-CM | POA: Diagnosis not present

## 2022-12-10 DIAGNOSIS — H832X3 Labyrinthine dysfunction, bilateral: Secondary | ICD-10-CM

## 2022-12-10 DIAGNOSIS — R519 Headache, unspecified: Secondary | ICD-10-CM

## 2022-12-10 DIAGNOSIS — N186 End stage renal disease: Secondary | ICD-10-CM

## 2022-12-10 DIAGNOSIS — M5416 Radiculopathy, lumbar region: Secondary | ICD-10-CM

## 2022-12-10 DIAGNOSIS — F0670 Mild neurocognitive disorder due to known physiological condition without behavioral disturbance: Secondary | ICD-10-CM

## 2022-12-10 DIAGNOSIS — E1142 Type 2 diabetes mellitus with diabetic polyneuropathy: Secondary | ICD-10-CM

## 2022-12-10 DIAGNOSIS — I999 Unspecified disorder of circulatory system: Secondary | ICD-10-CM

## 2022-12-10 DIAGNOSIS — G8929 Other chronic pain: Secondary | ICD-10-CM

## 2022-12-10 NOTE — Patient Instructions (Signed)
Zonisamide '75mg'$  daily

## 2022-12-10 NOTE — Progress Notes (Unsigned)
NEUROLOGY FOLLOW UP OFFICE NOTE  Robert [name] IM:[id number]  Assessment/Plan:   Chronic daily headaches Mild vascular neurocognitive disorder Bilateral vestibular hypofunction History of post-operative left greater than right bilateral embolic infarcts s/p CABG and AVR Lumbar radiculopathy, right-sided Hypertension Type 2 diabetes mellitus ESRD s/p renal transplant, immunocompromised    He reports that his short term disability runs out on 11/17.  I don't think he will be able to return to work and should consider long-term disability.   Continue zonisamide '75mg'$  at bedtime  Secondary stroke prevention as managed by cardiology and PCP: Eliquis Statin.  LDL goal less than 70 Normotensive blood pressure Hgb A1c goal less than 7 Follow up in 5 months.    Subjective:  Robert [name] is a 62 year old left-handed male with CAD, DM II, aortic valve stenosis, ESRD s/p renal transplant with CKD3b, hypothyroidism, HTN, sleep apnea and paroxysmal atrial tachycardia and PSVT who follows up for stroke.   UPDATE: Current medications:  Zonisamide '75mg'$  daily, Eliquis, rosuvastatin, duloxetine '30mg'$  daily, gabapentin '200mg'$  AM and '300mg'$  PM, amlodipine, carvedilol, doxazosin, clonidine, furosemide, HCTZ, hydralazine, duloxetine, prednisone, Cellcept, tramadol     When he gets stressed out, he will develop double vision.  On 10/15/2022, he developed dizziness, skewed diplopia and right sided numbness.  He was seen in ED where MRI of brain and MRA head and neck personally reviewed revealed no changes.  He was found to have a UTI and treated accordingly.  His blood sugar was low, in the 40s.  Sometimes he still drags his right leg.    Headaches are now infrequent on zonisamide.  They occur every 2 to 3 weeks.  Treats with extra-strength Tylenol or tramadol if more severe.  Usually triggered by stress.  He has significant insomnia.  Takes trazodone or Ambien, which only works for only a couple of  hours.  Sometimes he just can't fall asleep.  Sometimes it is because he has to urinate.  He now has to self-cath.     HISTORY: Following CABG x1 and aortic valve replacement  on 10/05/2021, he noted that he was unable to lift his right leg.  He has history of right lower extremity weakness and pain following renal transplant in 2018 but his leg was now almost plegic.  CT head negative for acute abnormality.  MRI of brain on 10/11/2021 revealed scattered acute and subacute small infarcts in multiple vascular territories including left cerebellar hemisphere, left MCA territory and bilateral PCA territory.  MRA of head and neck demonstrated chronic occluded V4 segment of the right vertebral artery as well as moderate stenosis of the right ACA A2.  The intra-operative TEE on 12/29 revealed no evidence of thrombus.  He did develop post-operative valvular a fib and was started on warfarin.  To evaluate for possible radiculopathy, he also had MRI of lumbar spine which revealed multilevel degenerative disc and endplate disease with mild spinal and left lateral recess stenosis at L1-2, moderate right neural foraminal stenosis at L3 and bilateral L4 nerve levels and moderate to severe neural foraminal stenosis at the bilateral L5 nerve levels.  He was discharged to SNF.  He was readmitted to the hospital on 10/21/2021 for hypertensive urgency presenting as dizziness and nausea.  Blood pressure was 204/119.  CT head again demonstrated known subacute embolic infarcts but no new acute findings.  He had to be hospitalized again on 11/08/2021 for   pyelonephritis and UTI.    Since the stroke, he had been  experiencing headaches and dizziness.  Headaches are severe throbbing pain from left eye radiating to both temples.  Sometimes nausea but no photophobia, phonophobia or visual disturbance.  Occurs every other day.  Treats with tramadol and Tylenol.  Lasts 30 minutes with treatment, otherwise 1.5 hours.  Takes tramadol and  Tylenol daily.  Dizziness is persistent but will have episodes of increased severity lasting 15 minutes every 2 days.  He underwent vestibular evaluation by physical therapy.  Oculomotor exam revealed saccades with superior smooth pursuits, undershooting with saccadic testing, slow and guarded VOR, positive HIT right greater than left, and negative positional testing.    Since the stroke, he has had episode of vertical double vision for several seconds.  He had worsening symptoms on 11/24/2021 while working with OT/PT.  Still has right leg weakness.  Arm is improved.  He has trouble ambulating.  He was seen in the ED where etiology was thought to be due to hypoglycemia.  CT head and follow up MRI of brain showed no acute findings.  Since the stroke, he has trouble remembering events that had occurred around that time.  Underwent neuropsychological evaluation on 02/08/2022 consistent with primarily mild neurocognitive disorder in conjunction with underlying anxiety, chronic pain (neuropathy) and sleep dysfunction.    Admitted to hospital on 04/30/2022 for UTI when he developed sudden onset of subjective bilateral facial numbness and right arm numbness and weakness.  Ongoing chronic dizziness and headache which is not new.  Other than known right leg weakness, no new objective focal deficits noted.  Stroke code initiated.  CT and subsequent MRI of head showed no acute stroke.  MRA head and neck revealed no LVO or hemodynamically significant stenosis.  Reported 8 mm left ICA terminus outpouching likely artifact due to motion.  2D echo showed EF 60-65%.  LDL 65.  Hgb A1c 7.4.  No change in secondary stroke prevention made in regards to antithrombotic therapy and statin- -advised to continue Eliquis and Crestor.  To follow up on possible cerebral aneurysm, he had a repeat MRA of head on 06/01/2022 which again demonstrated chronic occlusion of the right vertebral artery beyond the PICA origin but no aneurysm.    Past  medications: nortriptyline (side effects)  PAST MEDICAL HISTORY: Past Medical History:  Diagnosis Date   Acute cystitis without hematuria 11/09/2021   AKI (acute kidney injury) 08/08/2017   Elevated sCr to 2.5 (baseline ~1.7) noted at OSH - Subsequent transfer to [facility]  - Currently on IVMF in the setting of diarrhea and AKI - sCr at baseline at time of discharge   Anemia in chronic kidney disease 03/30/2015   Anticoagulated on Coumadin 11/09/2021   Aortic valve stenosis    Arthritis    Atrial fibrillation 11/29/2021   Benign essential hypertension 07/30/2014   prior to transplant pt took Metorpolol tartrate '50mg'$  qd on off HD days   Benign neoplasm of transverse colon    Cancer    skin ca right shoulder, plastic dsyplasia, pre-Ca polpys removed on Colonoscopy- 07/2014   Cerebrovascular accident (CVA) due to embolism of precerebral artery    Chronic renal failure    Colon polyps 07/23/2014   Tubular adenomas x 5   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   Diverticulosis of colon without hemorrhage  Eczema    ESRD (end stage renal disease)    hemodialysis 06/2014-02/2017, s/p living unrelated kidney transplant 02/26/17   Exertional dyspnea    Fatigue 01/08/2014   Fissure of skin 03/08/2020   GI bleed 07/26/2017   Headache    Heart murmur    aortic stenosis (moderate-severe 04/2021)   History of COVID-19 2023   History of unilateral nephrectomy 09/06/2013   Hydrocele, acquired 04/07/2017   Hyperlipidemia 09/06/2013   Hypertensive urgency 10/21/2021   Hyponatremia 11/09/2021   Hypothyroidism    Immunosuppressed status 02/26/2017   Induction agent: Solumedrol - Envarsus '3mg'$  daily  - Home dose decreased from '5mg'$  to '3mg'$  in the setting of FK 15.7 while  inpatient   - Previously on Prograf, though d/c'ed during previous admission; suspected this may be causing hallucinations  - Cellcept '1000mg'$  Q12H  - Prednisone '5mg'$  daily   Inguinal hernia without obstruction or gangrene 06/30/2017   Right inguinal wall defect appreciated by CT imaging [place] and on exam Dr. Paulla Fore today Dr. Paulla Fore has discussed hernia repair with mesh at some point in near future  Last Assessment & Plan:  Formatting of this note might be different from the original. Right inguinal wall defect appreciated by CT imaging [place] and on exam Dr. Paulla Fore today Dr. Paulla Fore has discussed hern   Kidney transplanted 02/26/2017   Date of Transplant = 02/26/17  ABO (Donor/Recipient) = compatible Blood type: O+/A+ DonorType: Living donor, unrelated Allograft type:Left kidney Donor anatomy: 2 arteries, 1 veins and single ureter  Pants-to-skirt anastomosis of dual renal arteries on the backtable. Donor Kidney Bx: Not applicable Allograft injury/complications: None Ne   Lower extremity edema 09/06/2013   Macular degeneration    Mild vascular neurocognitive disorder 02/08/2022   OAB (overactive bladder) 12/28/2019   Obstructive sleep apnea 09/06/2013   On Bi-PAP  Last Assessment & Plan:  Formatting of this note might be different from the original. - History of OSA, continue CPAP at night   Peritoneal dialysis catheter in place 02/24/2015   RLQ abdominal pain 04/19/2020   S/P AVR (aortic valve replacement) 10/05/2021   SBO (small bowel obstruction) 05/2015   Scrotal edema 03/14/2017   Testicular ultrasound did show hydrocele  Will refer to urology regarding ongoign swelling, hydrocele.   Last Assessment & Plan:  Formatting of this note might be different from the original.   Testicular ultrasound did show hydrocele  Will refer to urology regarding ongoign swelling, hydrocele.   Shortness of breath    with exertion   Stage 3b chronic kidney disease (CKD) 03/22/2014   SVT (supraventricular  tachycardia) 09/06/2013   Tremor of both hands 06/29/2017   Last Assessment & Plan:  Present since transplant, stable and unchanged Query from tacrolimus -Improved tremors noted and verbalized by pt - Prograf doses titrated per FK levels  Last Assessment & Plan:  Formatting of this note might be different from the original. Present since transplant, stable and unchanged Query from tacrolimus -Improved tremors noted and verbalized by pt - Prograf doses titr   Type 2 diabetes mellitus with hyperglycemia, with long-term current use of insulin [id number]   Umbilical hernia    s/p repair 04/28/15    MEDICATIONS: Current Outpatient Medications on File Prior to Visit  Medication Sig Dispense Refill   acetaminophen (TYLENOL) 325 MG tablet Take 2 tablets (650 mg total) by mouth every 4 (four) hours as needed for moderate pain.     amLODipine (NORVASC) 5 MG tablet Take 1.5 tablets (  7.5 mg total) by mouth daily. 45 tablet 6   apixaban (ELIQUIS) 5 MG TABS tablet Take 1 tablet (5 mg total) by mouth 2 (two) times daily. Stop coumadin, monitor the PT/INR, and start Eliquis when the INR is <2. 180 tablet 3   B-D UF III MINI PEN NEEDLES 31G X 5 MM MISC USE TO INJECT INSULIN 4 TIMES A DAY 360 each 2   belatacept (NULOJIX) 250 MG SOLR injection Inject 24.5 mLs into the vein every 28 (twenty-eight) days.      carvedilol (COREG) 25 MG tablet Take 1 tablet (25 mg total) by mouth 2 (two) times daily with a meal.     chlorthalidone (HYGROTON) 25 MG tablet Take 1 tablet by mouth daily.     Continuous Blood Gluc Sensor (FREESTYLE LIBRE 3 SENSOR) MISC SMARTSIG:1 Topical Every 2 Weeks     doxazosin (CARDURA) 2 MG tablet Take 0.5 tablets (1 mg total) by mouth daily. Take 0.5 Tablet Daily 45 tablet 3   gabapentin (NEURONTIN) 100 MG capsule Take 200-300 mg by mouth See admin instructions. Taking 200 mg in the AM and 300 mg in the evening (Patient not taking: Reported on 07/26/2022)     hydrALAZINE (APRESOLINE) 100 MG tablet  TAKE 1 TABLET BY MOUTH EVERY 8 HOURS (Patient taking differently: Take 100 mg by mouth 2 (two) times daily. May take a noon dose if Systolic is > 0000000) [id number] tablet 1   insulin aspart (NOVOLOG) 100 UNIT/ML injection Inject 5-20 Units into the skin daily as needed for high blood sugar.     insulin regular human CONCENTRATED (HUMULIN R) 500 UNIT/ML injection Inject 125 Units into the skin every 3 (three) days. Omnipod     levothyroxine (SYNTHROID) 112 MCG tablet Take 1 tablet (112 mcg total) by mouth daily. 90 tablet 3   mycophenolate (CELLCEPT) 250 MG capsule Take 1,000 mg by mouth every 12 (twelve) hours.      NEEDLE, DISP, 18 G (BD HYPODERMIC NEEDLE) 18G X 1" MISC USE AS DIRECTED TO INJECT 0.6MLS OF TESTOSTERONE EVERY 7 DAYS (DRAW UP) 100 each 1   nitroGLYCERIN (NITROSTAT) 0.4 MG SL tablet Place 0.4 mg under the tongue every 5 (five) minutes as needed for chest pain.     NON FORMULARY CPAP/BIPAP  at bedtime  BIPAP per pt     predniSONE (DELTASONE) 5 MG tablet Take 5 mg by mouth daily with breakfast. Continuously     rosuvastatin (CRESTOR) 20 MG tablet Take 1 tablet (20 mg total) by mouth daily. 90 tablet 3   senna-docusate (SENOKOT-S) 8.6-50 MG tablet Take 1 tablet by mouth at bedtime as needed for mild constipation. (Patient taking differently: Take 1 tablet by mouth every other day.)     Syringe, Disposable, 1 ML MISC Use to draw up 0.34m of testosterone medication 25 each 0   SYRINGE-NEEDLE, DISP, 3 ML (BD PLASTIPAK SYRINGE) 21G X 1" 3 ML MISC USE AS DIRECTED TO INJECT 0.6MLS OF TESTOSTERONE EVERY 7 DAYS (INJECT) 100 each 1   tamsulosin (FLOMAX) 0.4 MG CAPS capsule Take 0.8 mg by mouth daily after supper. (Patient not taking: Reported on 07/26/2022)     Testosterone Cypionate 200 MG/ML SOLN Inject 120 mg as directed once a week. Saturday     zolpidem (AMBIEN) 10 MG tablet Take 10 mg by mouth at bedtime as needed for sleep.     zonisamide (ZONEGRAN) 25 MG capsule '25MG'$  DAILY FOR ONE WEEK, THEN '50MG'$   DAILY 180 capsule 1   No  current facility-administered medications on file prior to visit.    ALLERGIES: No Known Allergies  FAMILY HISTORY: Family History  Problem Relation Age of Onset   Colon polyps Father    Stroke Father    Dementia Father        vascular   Colon cancer Neg Hx    Stomach cancer Neg Hx       Objective:  Blood pressure 117/71, pulse 76, height '5\' 11"'$  (1.803 m), weight 289 lb (131.1 kg), SpO2 94 %. General: No acute distress.  Patient appears well-groomed.   Head:  Normocephalic/atraumatic Eyes:  Fundi examined but not visualized Neck: supple, no paraspinal tenderness, full range of motion Heart:  Regular rate and rhythm Neurological Exam: alert and oriented to person, place, and time.  Speech fluent and not dysarthric, language intact.  CN II-XII intact. Bulk and tone normal, muscle strength 5-/5 right upper extremity and right hip flexion, otherwise, 5/5 throughout.  Sensation to light touch intact.  Deep tendon reflexes 2+ throughout.  Finger to nose testing intact.  Broad-based cautious gait.  Ambulates with cane.  Romberg positive.     Metta Clines, DO  CC: Domenick Gong, MD

## 2022-12-14 ENCOUNTER — Ambulatory Visit (INDEPENDENT_AMBULATORY_CARE_PROVIDER_SITE_OTHER): Payer: 59 | Admitting: Podiatry

## 2022-12-14 ENCOUNTER — Encounter: Payer: Self-pay | Admitting: Podiatry

## 2022-12-14 DIAGNOSIS — B351 Tinea unguium: Secondary | ICD-10-CM | POA: Diagnosis not present

## 2022-12-14 DIAGNOSIS — M79675 Pain in left toe(s): Secondary | ICD-10-CM

## 2022-12-14 DIAGNOSIS — M79674 Pain in right toe(s): Secondary | ICD-10-CM

## 2022-12-14 DIAGNOSIS — E1149 Type 2 diabetes mellitus with other diabetic neurological complication: Secondary | ICD-10-CM | POA: Diagnosis not present

## 2022-12-14 NOTE — Progress Notes (Signed)
This patient returns to my office for at risk foot care.  This patient requires this care by a professional since this patient will be at risk due to having uncontrolled diabetes and kidney transplant.  This patient is unable to cut nails himself since the patient cannot reach his nails.These nails are painful walking and wearing shoes.  Resolved heel fissure.   This patient presents for at risk foot care today.  General Appearance  Alert, conversant and in no acute stress.  Vascular  Dorsalis pedis and posterior tibial  pulses are palpable  bilaterally.  Capillary return is within normal limits  bilaterally. Temperature is within normal limits  bilaterally.  Neurologic  Senn-Weinstein monofilament wire test diminished   bilaterally. Muscle power within normal limits bilaterally.  Nails Thick disfigured discolored nails with subungual debris  from hallux to fifth toes bilaterally. No evidence of bacterial infection or drainage bilaterally.  Orthopedic  No limitations of motion  feet .  No crepitus or effusions noted.  No bony pathology or digital deformities noted. HAV  B/L.  Skin  normotropic skin with no porokeratosis noted bilaterally.  No signs of infections or ulcers noted.     Onychomycosis  Pain in right toes  Pain in left toes   Consent was obtained for treatment procedures.   Mechanical debridement of nails 1-5  bilaterally performed with a nail nipper.  Filed with dremel without incident.     Return office visit  3 months                   Told patient to return for periodic foot care and evaluation due to potential at risk complications.   Salisa Broz DPM  

## 2022-12-18 ENCOUNTER — Ambulatory Visit: Payer: 59 | Admitting: Podiatry

## 2022-12-29 ENCOUNTER — Other Ambulatory Visit: Payer: Self-pay | Admitting: General Practice

## 2022-12-31 ENCOUNTER — Other Ambulatory Visit: Payer: Self-pay | Admitting: Cardiovascular Disease

## 2023-01-07 ENCOUNTER — Other Ambulatory Visit: Payer: Self-pay | Admitting: Endocrinology

## 2023-01-07 DIAGNOSIS — E782 Mixed hyperlipidemia: Secondary | ICD-10-CM

## 2023-01-29 ENCOUNTER — Other Ambulatory Visit (INDEPENDENT_AMBULATORY_CARE_PROVIDER_SITE_OTHER): Payer: 59

## 2023-01-29 DIAGNOSIS — E1165 Type 2 diabetes mellitus with hyperglycemia: Secondary | ICD-10-CM | POA: Diagnosis not present

## 2023-01-29 DIAGNOSIS — E039 Hypothyroidism, unspecified: Secondary | ICD-10-CM

## 2023-01-29 DIAGNOSIS — Z794 Long term (current) use of insulin: Secondary | ICD-10-CM | POA: Diagnosis not present

## 2023-01-29 DIAGNOSIS — E291 Testicular hypofunction: Secondary | ICD-10-CM

## 2023-01-29 LAB — BASIC METABOLIC PANEL
BUN: 36 mg/dL — ABNORMAL HIGH (ref 6–23)
CO2: 25 mEq/L (ref 19–32)
Calcium: 9.7 mg/dL (ref 8.4–10.5)
Chloride: 102 mEq/L (ref 96–112)
Creatinine, Ser: 2.51 mg/dL — ABNORMAL HIGH (ref 0.40–1.50)
GFR: 26.51 mL/min — ABNORMAL LOW (ref >60.00)
Glucose, Bld: 252 mg/dL — ABNORMAL HIGH (ref 70–99)
Potassium: 3.2 mEq/L — ABNORMAL LOW (ref 3.5–5.1)
Sodium: 138 mEq/L (ref 135–145)

## 2023-01-29 LAB — HEMOGLOBIN A1C: Hgb A1c MFr Bld: 7.6 % — ABNORMAL HIGH (ref 4.6–6.5)

## 2023-01-29 LAB — TESTOSTERONE: Testosterone: 367.79 ng/dL (ref 300.00–890.00)

## 2023-01-29 LAB — TSH: TSH: 1.16 u[IU]/mL (ref 0.35–5.50)

## 2023-02-01 ENCOUNTER — Encounter: Payer: Self-pay | Admitting: Endocrinology

## 2023-02-01 ENCOUNTER — Ambulatory Visit (INDEPENDENT_AMBULATORY_CARE_PROVIDER_SITE_OTHER): Payer: 59 | Admitting: Endocrinology

## 2023-02-01 VITALS — BP 138/84 | HR 75 | Wt 295.2 lb

## 2023-02-01 DIAGNOSIS — Z794 Long term (current) use of insulin: Secondary | ICD-10-CM | POA: Diagnosis not present

## 2023-02-01 DIAGNOSIS — E1165 Type 2 diabetes mellitus with hyperglycemia: Secondary | ICD-10-CM | POA: Diagnosis not present

## 2023-02-01 DIAGNOSIS — E291 Testicular hypofunction: Secondary | ICD-10-CM

## 2023-02-01 DIAGNOSIS — E039 Hypothyroidism, unspecified: Secondary | ICD-10-CM

## 2023-02-01 NOTE — Patient Instructions (Signed)
Meal bolus 30 min before each meal or nite snack

## 2023-02-01 NOTE — Progress Notes (Signed)
Patient ID: Robert [name], male   DOB: 09-Apr-1961, 62 y.o.   MRN: [id number]     Reason for Appointment: Endocrinology follow-up  History of Present Illness   DIABETES diagnosis date:  2012  Previous history: His blood sugar has been difficult to control since onset. Initially was taking oral hypoglycemic drugs like glyburide but could not take metformin because of renal dysfunction. Since Victoza did not help his control he was started on Lantus and then U 500 insulin also His A1c has previously ranged from 7.8-9.8, the lowest level in 03/2013 He was on Victoza but because of his markedly decreased appetite and weight loss this was stopped in 9/15  Recent history:   Non-hypoglycemic insulin regimen: None  OMNIPOD pump settings with U-500 insulin:   Midnight = 0.15, 6 AM = 1.8, 11 AM = 1.1 and 5 PM = 0.20 Bolus settings: Insulin to carb ratio 1:15 Sensitivity or ISF 45 instead of 60  Carbohydrate ratio 1:10, correction factor I: 60  Bolus insulin with NovoLog FlexPen variable doses 10-15  His A1c is 7.6, was 6.9  Fructosamine previously 280  Current blood sugar patterns, management and problems identified:  He has been having marked hyperglycemia overnight and unclear why Blood sugars are generally much better during the day with periodic tendency to low sugars before lunch or dinner Although he said that he is sometimes eating crackers or bread during the night his blood sugars are not spiking up but staying persistently high well over 200 most of the time Although he thinks he is bolusing for carbohydrates using the pump it does not appear that he is taking more than 2 units bolus most of the time despite having high sugars at meals frequently However not seeing many postprandial spikes especially after his lunch, may see some sugars going up progressively after evening meal Usually taking about 2 boluses a day at different times but on some days not at  all Even though blood sugars may be significantly high through the night he does not appear to be taking any boluses on his pump at that time but he says he took NovoLog last night with an injection when the blood sugars were high Hypoglycemia as above related to likely excessive basal on some days during the late afternoons His bolus amounts with the NovoLog injection cannot be assessed as he has no record Has not been on the OmniPod 5 because of the cost   Proper timing of medications in relation to meals: Yes.          Blood sugar data analysis from freestyle libre version 3 for the last 2 weeks through April 26:  His blood sugars are the highest overnight peaking around 4 AM and lowest around 4 PM Overnight blood sugars are mostly significantly high and starting to go up progressively after about 7 PM through 4 AM Only on 1 occasion his blood sugar was low normal overnight otherwise they are rising with variable patterns during the night sometimes well over 300  Pre-meal blood sugars are high at lunch.   His blood sugars do not show any specific mealtime spikes and difficult to know what his mealtimes are Occasionally will start seeing a rise in blood sugar significantly after his evening meal around 10 PM Hypoglycemia has been seen sporadically in the afternoon or before dinner, sometimes preceded by significantly high readings  CGM use % of time   2-week average/GV 185  Time in range  46%  % Time Above 180 36  % Time above 250 16  % Time Below 70 2     PRE-MEAL Fasting Lunch Dinner Bedtime 2 a  Glucose range:       Averages: 211 168 139  224   POST-MEAL PC Breakfast PC Lunch PC Dinner  Glucose range:     Averages: 211 147 155   Previously  CGM use % of time   2-week average/GV 151  Time in range        75%  % Time Above 180 24  % Time above 250   % Time Below 70 1     PRE-MEAL Fasting Lunch Dinner Bedtime Overall  Glucose range:       Averages: 146    151    POST-MEAL PC Breakfast PC Lunch PC Dinner  Glucose range:     Averages:  144 155    Meals: 2-3 meals per day. 7-8 am and Noon and Dinner 5-7 PM .              Weight control:   Wt Readings from Last 3 Encounters:  02/01/23 295 lb 4 oz (133.9 kg)  12/10/22 289 lb (131.1 kg)  10/31/22 278 lb (126.1 kg)        Complications:   nephropathy, neuropathy   Diabetes labs:   Lab Results  Component Value Date   HGBA1C 7.6 (H) 01/29/2023   HGBA1C 6.9 (H) 10/29/2022   HGBA1C 7.1 (H) 07/10/2022   Lab Results  Component Value Date   MICROALBUR 146.3 (H) 08/03/2019   LDLCALC 55 05/02/2022   CREATININE 2.51 (H) 01/29/2023    Lab Results  Component Value Date   FRUCTOSAMINE 280 10/29/2022   FRUCTOSAMINE 295 (H) 11/28/2021   FRUCTOSAMINE 296 (H) 07/04/2021   Lab Results  Component Value Date   HGB 12.9 (L) 10/15/2022       OTHER problems: See review of systems    Allergies as of 02/01/2023   No Known Allergies      Medication List        Accurate as of February 01, 2023 10:11 AM. If you have any questions, ask your nurse or doctor.          acetaminophen 325 MG tablet Commonly known as: TYLENOL Take 2 tablets (650 mg total) by mouth every 4 (four) hours as needed for moderate pain.   amLODipine 5 MG tablet Commonly known as: NORVASC TAKE 1.5 TABLETS BY MOUTH DAILY.   apixaban 5 MG Tabs tablet Commonly known as: ELIQUIS Take 1 tablet (5 mg total) by mouth 2 (two) times daily. Stop coumadin, monitor the PT/INR, and start Eliquis when the INR is <2.   B-D UF III MINI PEN NEEDLES 31G X 5 MM Misc Generic drug: Insulin Pen Needle USE TO INJECT INSULIN 4 TIMES A DAY   BD Hypodermic Needle 18G X 1" Misc Generic drug: NEEDLE (DISP) 18 G USE AS DIRECTED TO INJECT 0.6MLS OF TESTOSTERONE EVERY 7 DAYS (DRAW UP)   BD Plastipak Syringe 21G X 1" 3 ML Misc Generic drug: SYRINGE-NEEDLE (DISP) 3 ML USE AS DIRECTED TO INJECT 0.6MLS OF TESTOSTERONE EVERY 7 DAYS  (INJECT)   carvedilol 25 MG tablet Commonly known as: COREG Take 1 tablet (25 mg total) by mouth 2 (two) times daily with a meal.   chlorthalidone 25 MG tablet Commonly known as: HYGROTON Take 1 tablet by mouth daily.   doxazosin 2 MG tablet Commonly known as: CARDURA TAKE  0.5 TABLETS (1 MG TOTAL) BY MOUTH DAILY. TAKE 0.5 TABLET DAILY   DULoxetine 30 MG capsule Commonly known as: CYMBALTA Take 30 mg by mouth daily.   FreeStyle Libre 3 Sensor Misc SMARTSIG:1 Topical Every 2 Weeks   gabapentin 100 MG capsule Commonly known as: NEURONTIN Take 200-300 mg by mouth See admin instructions. Taking 200 mg in the AM and 300 mg in the evening   HUMULIN R 500 UNIT/ML injection Generic drug: insulin regular human CONCENTRATED Inject 125 Units into the skin every 3 (three) days. Omnipod   hydrALAZINE 100 MG tablet Commonly known as: APRESOLINE TAKE 1 TABLET BY MOUTH EVERY 8 HOURS   insulin aspart 100 UNIT/ML injection Commonly known as: novoLOG Inject 5-20 Units into the skin daily as needed for high blood sugar.   levothyroxine 112 MCG tablet Commonly known as: SYNTHROID Take 1 tablet (112 mcg total) by mouth daily.   mupirocin ointment 2 % Commonly known as: BACTROBAN 1 application Externally Twice a day for 7 days   mycophenolate 250 MG capsule Commonly known as: CELLCEPT Take 1,000 mg by mouth every 12 (twelve) hours.   nitroGLYCERIN 0.4 MG SL tablet Commonly known as: NITROSTAT Place 0.4 mg under the tongue every 5 (five) minutes as needed for chest pain.   NON FORMULARY CPAP/BIPAP  at bedtime  BIPAP per pt   Nulojix 250 MG Solr injection Generic drug: belatacept Inject 24.5 mLs into the vein every 28 (twenty-eight) days.   predniSONE 5 MG tablet Commonly known as: DELTASONE Take 5 mg by mouth daily with breakfast. Continuously   rosuvastatin 20 MG tablet Commonly known as: CRESTOR TAKE 1 TABLET BY MOUTH EVERY DAY   senna-docusate 8.6-50 MG  tablet Commonly known as: Senokot-S Take 1 tablet by mouth at bedtime as needed for mild constipation. What changed: when to take this   Syringe (Disposable) 1 ML Misc Use to draw up 0.9ml of testosterone medication   tamsulosin 0.4 MG Caps capsule Commonly known as: FLOMAX Take 0.8 mg by mouth daily after supper.   Testosterone Cypionate 200 MG/ML Soln Inject 120 mg as directed once a week. Saturday   traMADol 50 MG tablet Commonly known as: ULTRAM 1 tablet Oral Once a day for 90 days   traZODone 50 MG tablet Commonly known as: DESYREL 1 tablet at bedtime as needed Orally Once a day (Do not take if take ambien)   zolpidem 10 MG tablet Commonly known as: AMBIEN Take 10 mg by mouth at bedtime as needed for sleep.   zonisamide 25 MG capsule Commonly known as: ZONEGRAN Take 3 capsules (75 mg total) by mouth daily.        Allergies: No Known Allergies  Past Medical History:  Diagnosis Date   Acute cystitis without hematuria 11/09/2021   AKI (acute kidney injury) 08/08/2017   Elevated sCr to 2.5 (baseline ~1.7) noted at OSH - Subsequent transfer to [facility]  - Currently on IVMF in the setting of diarrhea and AKI - sCr at baseline at time of discharge   Anemia in chronic kidney disease 03/30/2015   Anticoagulated on Coumadin 11/09/2021   Aortic valve stenosis    Arthritis    Atrial fibrillation 11/29/2021   Benign essential hypertension 07/30/2014   prior to transplant pt took Metorpolol tartrate 50mg  qd on off HD days   Benign neoplasm of transverse colon    Cancer    skin ca right shoulder, plastic dsyplasia, pre-Ca polpys removed on Colonoscopy- 07/2014   Cerebrovascular accident (CVA)  due to embolism of precerebral artery    Chronic renal failure    Colon polyps 07/23/2014   Tubular adenomas x 5   Combined arterial insufficiency and corporo-venous occlusive erectile dysfunction 12/28/2019   Constipation    Coronary artery disease    Diabetic neuropathy 03/01/2017    Difficult intubation    unsure of actual problem but it was during the January 28, 2015 procedure.   Diverticulosis of colon without hemorrhage    Eczema    ESRD (end stage renal disease)    hemodialysis 06/2014-02/2017, s/p living unrelated kidney transplant 02/26/17   Exertional dyspnea    Fatigue 01/08/2014   Fissure of skin 03/08/2020   GI bleed 07/26/2017   Headache    Heart murmur    aortic stenosis (moderate-severe 04/2021)   History of COVID-19 2023   History of unilateral nephrectomy 09/06/2013   Hydrocele, acquired 04/07/2017   Hyperlipidemia 09/06/2013   Hypertensive urgency 10/21/2021   Hyponatremia 11/09/2021   Hypothyroidism    Immunosuppressed status 02/26/2017   Induction agent: Solumedrol - Envarsus 3mg  daily  - Home dose decreased from 5mg  to 3mg  in the setting of FK 15.7 while inpatient   - Previously on Prograf, though d/c'ed during previous admission; suspected this may be causing hallucinations  - Cellcept 1000mg  Q12H  - Prednisone 5mg  daily   Inguinal hernia without obstruction or gangrene 06/30/2017   Right inguinal wall defect appreciated by CT imaging [place] and on exam Dr. Roby Lofts today Dr. Roby Lofts has discussed hernia repair with mesh at some point in near future  Last Assessment & Plan:  Formatting of this note might be different from the original. Right inguinal wall defect appreciated by CT imaging [place] and on exam Dr. Roby Lofts today Dr. Roby Lofts has discussed hern   Kidney transplanted 02/26/2017   Date of Transplant = 02/26/17  ABO (Donor/Recipient) = compatible Blood type: O+/A+ DonorType: Living donor, unrelated Allograft type:Left kidney Donor anatomy: 2 arteries, 1 veins and single ureter  Pants-to-skirt anastomosis of dual renal arteries on the backtable. Donor Kidney Bx: Not applicable Allograft injury/complications: None Ne   Lower extremity edema 09/06/2013   Macular degeneration    Mild vascular neurocognitive disorder 02/08/2022   OAB (overactive  bladder) 12/28/2019   Obstructive sleep apnea 09/06/2013   On Bi-PAP  Last Assessment & Plan:  Formatting of this note might be different from the original. - History of OSA, continue CPAP at night   Peritoneal dialysis catheter in place 02/24/2015   RLQ abdominal pain 04/19/2020   S/P AVR (aortic valve replacement) 10/05/2021   SBO (small bowel obstruction) 05/2015   Scrotal edema 03/14/2017   Testicular ultrasound did show hydrocele  Will refer to urology regarding ongoign swelling, hydrocele.   Last Assessment & Plan:  Formatting of this note might be different from the original.   Testicular ultrasound did show hydrocele  Will refer to urology regarding ongoign swelling, hydrocele.   Shortness of breath    with exertion   Stage 3b chronic kidney disease (CKD) 03/22/2014   SVT (supraventricular tachycardia) 09/06/2013   Tremor of both hands 06/29/2017   Last Assessment & Plan:  Present since transplant, stable and unchanged Query from tacrolimus -Improved tremors noted and verbalized by pt - Prograf doses titrated per FK levels  Last Assessment & Plan:  Formatting of this note might be different from the original. Present since transplant, stable and unchanged Query from tacrolimus -Improved tremors noted and verbalized by pt - Prograf  doses titr   Type 2 diabetes mellitus with hyperglycemia, with long-term current use of insulin 07/14/2015   Umbilical hernia    s/p repair 04/28/15    Past Surgical History:  Procedure Laterality Date   AORTIC VALVE REPLACEMENT N/A 10/05/2021   Procedure: AORTIC VALVE REPLACEMENT (AVR) WITH INSPIRIS RESILIA AORTIC VALVE SIZE ;  Surgeon: Alleen Borne, MD;  Location: [facility] OR;  Service: Open Heart Surgery;  Laterality: N/A;   AV FISTULA PLACEMENT Right 02/25/2015   Procedure: RIGHT ARTERIOVENOUS (AV) FISTULA CREATION;  Surgeon: Nada Libman, MD;  Location: [facility] OR;  Service: Vascular;  Laterality: Right;   AV FISTULA PLACEMENT Right 09/15/2015    Procedure: ARTERIOVENOUS (AV) FISTULA CREATION- RIGHT ARM;  Surgeon: Nada Libman, MD;  Location: [facility] OR;  Service: Vascular;  Laterality: Right;   CAPD INSERTION N/A 05/18/2014   Procedure: LAPAROSCOPIC INSERTION CONTINUOUS AMBULATORY PERITONEAL DIALYSIS  (CAPD) CATHETER;  Surgeon: Axel Filler, MD;  Location: [facility] OR;  Service: General;  Laterality: N/A;   COLONOSCOPY     COLONOSCOPY WITH PROPOFOL N/A 07/12/2016   Procedure: COLONOSCOPY WITH PROPOFOL;  Surgeon: Rachael Fee, MD;  Location: [facility] END[facility]COPY;  Service: Endoscopy;  Laterality: N/A;   CORONARY ARTERY BYPASS GRAFT N/A 10/05/2021   Procedure: CORONARY ARTERY BYPASS GRAFTING (CABG) X 1, ON PUMP, USING LEFT INTERNAL MAMMARY ARTERY CONDUIT;  Surgeon: Alleen Borne, MD;  Location: [facility] OR;  Service: Open Heart Surgery;  Laterality: N/A;   declotting of fistula  08/2015   ESOPHAGOGASTRODUODENOSCOPY (EGD) WITH PROPOFOL N/A 07/12/2016   Procedure: ESOPHAGOGASTRODUODENOSCOPY (EGD) WITH PROPOFOL;  Surgeon: Rachael Fee, MD;  Location: [facility];  Service: Endoscopy;  Laterality: N/A;   FISTULA SUPERFICIALIZATION Right 02/07/2016   Procedure: RIIGHT UPPER ARM FISTULA SUPERFICIALIZATION;  Surgeon: Sherren Kerns, MD;  Location[facility] OR;  Service: Vascular;  Laterality: Right;   IJ catheter insertion     INSERTION OF DIALYSIS CATHETER Right 02/25/2015   Procedure: INSERTION OF RIGHT INTERNAL JUGULAR DIALYSIS CATHETER;  Surgeon: Nada Libman, MD;  Location: [facility] OR;  Service: Vascular;  Laterality: Right;   KIDNEY TRANSPLANT     LAPAROSCOPIC REPOSITIONING CAPD CATHETER N/A 06/16/2014   Procedure: LAPAROSCOPIC REPOSITIONING CAPD CATHETER;  Surgeon: Axel Filler, MD;  Location: [facility] OR;  Service: General;  Laterality: N/A;   LAPAROSCOPY N/A 05/04/2015   Procedure: LAPAROSCOPY DIAGNOSTIC LYSIS OF ADHESIONS;  Surgeon: Abigail Miyamoto, MD;  Location: [facility] OR;  Service: General;  Laterality: N/A;   MINOR REMOVAL OF PERITONEAL DIALYSIS CATHETER N/A  04/28/2015   Procedure:  REMOVAL OF PERITONEAL DIALYSIS CATHETER;  Surgeon: Abigail Miyamoto, MD;  Location: [facility] OR;  Service: General;  Laterality: N/A;   NEPHRECTOMY Left 1974   RENAL BIOPSY Right 2012   REVISON OF ARTERIOVENOUS FISTULA Right 05/26/2015   Procedure: SUPERFICIALIZATION OF ARTERIOVENOUS FISTULA WITH SIDE BRANCH LIGATIONS;  Surgeon: Nada Libman, MD;  Location: [facility] OR;  Service: Vascular;  Laterality: Right;   RIGHT/LEFT HEART CATH AND CORONARY ANGIOGRAPHY N/A 08/22/2021   Procedure: RIGHT/LEFT HEART CATH AND CORONARY ANGIOGRAPHY;  Surgeon: Lennette Bihari, MD;  [facility]: [facility];  Service: Cardiovascular;  Laterality: N/A;   SKIN CANCER EXCISION     right shoulder   SVT ABLATION N/A 11/23/2016   Procedure: SVT Ablation;  Surgeon: Will Jorja Loa, MD;  Location: [facility];  Service: Cardiovascular;  Laterality: N/A;   TEE WITHOUT CARDIOVERSION N/A 10/05/2021   Procedure: TRANSESOPHAGEAL ECHOCARDIOGRAM (TEE);  Surgeon: Alleen Borne,  MD;  Location: [facility] OR;  Service: Open Heart Surgery;  Laterality: N/A;   UMBILICAL HERNIA REPAIR N/A 05/18/2014   Procedure: HERNIA REPAIR UMBILICAL ADULT;  Surgeon: Axel Filler, MD;  Location: [facility] OR;  Service: General;  Laterality: N/A;   UMBILICAL HERNIA REPAIR N/A 04/28/2015   Procedure: UMBILICAL HERNIA REPAIR WITH MESH;  Surgeon: Abigail Miyamoto, MD;  Location: [facility] OR;  Service: General;  Laterality: N/A;    Family History  Problem Relation Age of Onset   Colon polyps Father    Stroke Father    Dementia Father        vascular   Colon cancer Neg Hx    Stomach cancer Neg Hx     Social History:  reports that he quit smoking about 10 years ago. His smoking use included cigars. He has never used smokeless tobacco. He reports that he does not currently use alcohol. He reports that he does not use drugs.  Review of Systems:  Hypertension:    His blood pressure has been managed by his nephrologist and transplant  team monitors BP at home periodically  BP Readings from Last 3 Encounters:  02/01/23 138/84  12/10/22 117/71  10/31/22 108/62   Lab Results  Component Value Date   CREATININE 2.51 (H) 01/29/2023   CREATININE 1.90 (H) 10/15/2022   CREATININE 1.92 (H) 10/15/2022    Lipids:  His LDL is below 70 with 20 mg Crestor Has no evidence of vascular disease     Lab Results  Component Value Date   CHOL 116 05/02/2022   CHOL 109 10/13/2021   CHOL 148 07/04/2021   Lab Results  Component Value Date   HDL 52 05/02/2022   HDL 31 (L) 10/13/2021   HDL 69.70 07/04/2021   Lab Results  Component Value Date   LDLCALC 55 05/02/2022   LDLCALC 65 10/13/2021   LDLCALC 67 07/04/2021   Lab Results  Component Value Date   TRIG 45 05/02/2022   TRIG 66 10/13/2021   TRIG 54.0 07/04/2021   Lab Results  Component Value Date   CHOLHDL 2.2 05/02/2022   CHOLHDL 3.5 10/13/2021   CHOLHDL 2 07/04/2021   No results found for: "LDLDIRECT"   HYPOTHYROIDISM: He has had long-standing hypothyroidism  Has been taking his 100 g levothyroxine every morning He does have persistent fatigue for other reasons  He has taken his levothyroxine regularly  Lab Results  Component Value Date   TSH 1.16 01/29/2023       HYPOGONADISM: 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  When testosterone level was low he was having fatigue and decreased motivation and because of his  he was prescribed He has been using AndroGel since February 2019 but subsequently stopped this because of cost  Current regimen is Depo testosterone 100 mg weekly since 11/2021 He does feel better when he takes the shots regularly He has done better since his last visit with taking the injection but forgot the shot last Saturday Despite this his testosterone level is over 300 now  Pretreatment free testosterone level low at 3.7   Lab Results  Component Value Date   TESTOSTERONE 367.79 01/29/2023    TESTOSTERONE 270.43 (L) 10/29/2022   TESTOSTERONE 184.05 (L) 07/10/2022   Lab Results  Component Value Date   HGB 12.9 (L) 10/15/2022    He has depression, taking 30 mg of Cymbalta; followed by PCP  Diabetic neuropathy: Has mild sensory loss on his last exam Followed by podiatrist regularly  Examination:   BP 138/84   Pulse 75   Wt 295 lb 4 oz (133.9 kg)   SpO2 97%   BMI 41.18 kg/m   Body mass index is 41.18 kg/m.    ASSESSMENT/ PLAN:    Diabetes type 2 with obesity, insulin-requiring:   See history of present illness for detailed discussion of his current management, problems identified and glycemic patterns on his freestyle libre sensor download  Has been on the OmniPod pump with U-500 recently  A1c is 7.6  Recent GMI also higher at 7.7  Currently he is on insulin with the pump and occasionally NovoLog injections with the pen but with the pump he is getting only about 18 units basal per day and boluses are adding up to only less than 3 units/day average   As above most of his high readings appear to be overnight and not clear why Some of his snacks and likely carbohydrate during some meals is not covered with maximum bolus generally about 2 units at any given time   Recommendations: He does need to put in actual amounts of carbohydrate coverage with his pump and not just use correction boluses which he appears to be doing Also needs to take boluses consistently for every meal  Also will need boluses at night when he is eating snacks Will look into getting the OmniPod at any point he is able to afford it Considering his current sensitivity on the pump of 1: 60 he likely needs to use NovoLog correction doses of about 1: 15 correction factor Consider follow-up with nutritionist New basal rate profile: 12 AM = 0.5, 6 AM = 1.8, 11 AM = 0 point 9 and 4 PM = 0.3   HYPOTHYROIDISM: Continue same levothyroxine dose as TSH is again normal  Hypogonadism: He needs to  take his testosterone injection consistently weekly but will continue the same dose as his level is in the low end of the range with the 1 missing 1 dose slightly    There are no Patient Instructions on file for this visit.     Reather Littler 02/01/2023, 10:11 AM    Note: This office note was prepared with Dragon voice recognition system technology. Any transcriptional errors that result from this process are unintentional.

## 2023-02-09 ENCOUNTER — Other Ambulatory Visit: Payer: Self-pay | Admitting: Cardiovascular Disease

## 2023-02-09 DIAGNOSIS — I4891 Unspecified atrial fibrillation: Secondary | ICD-10-CM

## 2023-02-11 NOTE — Telephone Encounter (Signed)
Prescription refill request for Eliquis received. Indication: Afib  Last office visit: 07/26/22 Tresa Endo)  Scr: 2.51 (01/29/23)  Age: 62 Weight: 133.9kg  Appropriate dose. Refill sent.

## 2023-02-14 ENCOUNTER — Ambulatory Visit: Payer: 59 | Admitting: Nurse Practitioner

## 2023-02-26 NOTE — Progress Notes (Signed)
Cardiology Clinic Note   Patient Name: EVERRETT DAVIDOVICH Date of Encounter: 03/01/2023  Primary Care Provider:  Gaspar Garbe, MD Primary Cardiologist:  Nicki Guadalajara, MD  Patient Profile    CAYLE BAILEY 62 year old male presents to the clinic for  follow-up of his dizziness, HTN, and coronary disease.  Past Medical History    Past Medical History:  Diagnosis Date   Acute cystitis without hematuria 11/09/2021   AKI (acute kidney injury) 08/08/2017   Elevated sCr to 2.5 (baseline ~1.7) noted at OSH - Subsequent transfer to [facility]  - Currently on IVMF in the setting of diarrhea and AKI - sCr at baseline at time of discharge   Anemia in chronic kidney disease 03/30/2015   Anticoagulated on Coumadin 11/09/2021   Aortic valve stenosis    Arthritis    Atrial fibrillation 11/29/2021   Benign essential hypertension 07/30/2014   prior to transplant pt took Metorpolol tartrate 50mg  qd on off HD days   Benign neoplasm of transverse colon    Cancer    skin ca right shoulder, plastic dsyplasia, pre-Ca polpys removed on Colonoscopy- 07/2014   Cerebrovascular accident (CVA) due to embolism of precerebral artery    Chronic renal failure    Colon polyps 07/23/2014   Tubular adenomas x 5   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   Diverticulosis of colon without hemorrhage    Eczema    ESRD (end stage renal disease)    hemodialysis 06/2014-02/2017, s/p living unrelated kidney transplant 02/26/17   Exertional dyspnea    Fatigue 01/08/2014   Fissure of skin 03/08/2020   GI bleed 07/26/2017   Headache    Heart murmur    aortic stenosis (moderate-severe 04/2021)   History of COVID-19 2023   History of unilateral nephrectomy 09/06/2013   Hydrocele, acquired  04/07/2017   Hyperlipidemia 09/06/2013   Hypertensive urgency 10/21/2021   Hyponatremia 11/09/2021   Hypothyroidism    Immunosuppressed status 02/26/2017   Induction agent: Solumedrol - Envarsus 3mg  daily  - Home dose decreased from 5mg  to 3mg  in the setting of FK 15.7 while inpatient   - Previously on Prograf, though d/c'ed during previous admission; suspected this may be causing hallucinations  - Cellcept 1000mg  Q12H  - Prednisone 5mg  daily   Inguinal hernia without obstruction or gangrene 06/30/2017   Right inguinal wall defect appreciated by CT imaging [place] and on exam Dr. Roby Lofts today Dr. Roby Lofts has discussed hernia repair with mesh at some point in near future  Last Assessment & Plan:  Formatting of this note might be different from the original. Right inguinal wall defect appreciated by CT imaging [place] and on exam Dr. Roby Lofts today Dr. Roby Lofts has discussed hern   Kidney transplanted 02/26/2017   Date of Transplant = 02/26/17  ABO (Donor/Recipient) = compatible Blood type: O+/A+ DonorType: Living donor, unrelated Allograft type:Left kidney Donor anatomy: 2 arteries, 1 veins and single ureter  Pants-to-skirt anastomosis of dual renal arteries on the backtable. Donor Kidney Bx: Not applicable Allograft injury/complications: None Ne   Lower extremity edema 09/06/2013   Macular degeneration    Mild vascular neurocognitive disorder 02/08/2022   OAB (overactive bladder) 12/28/2019   Obstructive sleep apnea 09/06/2013   On Bi-PAP  Last Assessment &  Plan:  Formatting of this note might be different from the original. - History of OSA, continue CPAP at night   Peritoneal dialysis catheter in place 02/24/2015   RLQ abdominal pain 04/19/2020   S/P AVR (aortic valve replacement) 10/05/2021   SBO (small bowel obstruction) 05/2015   Scrotal edema 03/14/2017   Testicular ultrasound did show hydrocele  Will refer to urology regarding ongoign swelling, hydrocele.   Last Assessment & Plan:  Formatting  of this note might be different from the original.   Testicular ultrasound did show hydrocele  Will refer to urology regarding ongoign swelling, hydrocele.   Shortness of breath    with exertion   Stage 3b chronic kidney disease (CKD) 03/22/2014   SVT (supraventricular tachycardia) 09/06/2013   Tremor of both hands 06/29/2017   Last Assessment & Plan:  Present since transplant, stable and unchanged Query from tacrolimus -Improved tremors noted and verbalized by pt - Prograf doses titrated per FK levels  Last Assessment & Plan:  Formatting of this note might be different from the original. Present since transplant, stable and unchanged Query from tacrolimus -Improved tremors noted and verbalized by pt - Prograf doses titr   Type 2 diabetes mellitus with hyperglycemia, with long-term current use of insulin 07/14/2015   Umbilical hernia    s/p repair 04/28/15   Past Surgical History:  Procedure Laterality Date   AORTIC VALVE REPLACEMENT N/A 10/05/2021   Procedure: AORTIC VALVE REPLACEMENT (AVR) WITH INSPIRIS RESILIA AORTIC VALVE SIZE ;  Surgeon: Alleen Borne, MD;  Location: [facility] OR;  Service: Open Heart Surgery;  Laterality: N/A;   AV FISTULA PLACEMENT Right 02/25/2015   Procedure: RIGHT ARTERIOVENOUS (AV) FISTULA CREATION;  Surgeon: Nada Libman, MD;  Location: [facility] OR;  Service: Vascular;  Laterality: Right;   AV FISTULA PLACEMENT Right 09/15/2015   Procedure: ARTERIOVENOUS (AV) FISTULA CREATION- RIGHT ARM;  Surgeon: Nada Libman, MD;  Location: [facility] OR;  Service: Vascular;  Laterality: Right;   CAPD INSERTION N/A 05/18/2014   Procedure: LAPAROSCOPIC INSERTION CONTINUOUS AMBULATORY PERITONEAL DIALYSIS  (CAPD) CATHETER;  Surgeon: Axel Filler, MD;  Location: [facility] OR;  Service: General;  Laterality: N/A;   COLONOSCOPY     COLONOSCOPY WITH PROPOFOL N/A 07/12/2016   Procedure: COLONOSCOPY WITH PROPOFOL;  Surgeon: Rachael Fee, MD;  Location: [facility] ENDOSCOPY;  Service: Endoscopy;  Laterality:  N/A;   CORONARY ARTERY BYPASS GRAFT N/A 10/05/2021   Procedure: CORONARY ARTERY BYPASS GRAFTING (CABG) X 1, ON PUMP, USING LEFT INTERNAL MAMMARY ARTERY CONDUIT;  Surgeon: Alleen Borne, MD;  Location: [facility] OR;  Service: Open Heart Surgery;  Laterality: N/A;   declotting of fistula  08/2015   ESOPHAGOGASTRODUODENOSCOPY (EGD) WITH PROPOFOL N/A 07/12/2016   Procedure: ESOPHAGOGASTRODUODENOSCOPY (EGD) WITH PROPOFOL;  Surgeon: Rachael Fee, MD;  Location: [facility] ENDOSCOPY;  Service: Endoscopy;  Laterality: N/A;   FISTULA SUPERFICIALIZATION Right 02/07/2016   Procedure: RIIGHT UPPER ARM FISTULA SUPERFICIALIZATION;  Surgeon: Sherren Kerns, MD;  Location: [facility] OR;  Service: Vascular;  Laterality: Right;   IJ catheter insertion     INSERTION OF DIALYSIS CATHETER Right 02/25/2015   Procedure: INSERTION OF RIGHT INTERNAL JUGULAR DIALYSIS CATHETER;  Surgeon: Nada Libman, MD;  Location: [facility] OR;  Service: Vascular;  Laterality: Right;   KIDNEY TRANSPLANT     LAPAROSCOPIC REPOSITIONING CAPD CATHETER N/A 06/16/2014   Procedure: LAPAROSCOPIC REPOSITIONING CAPD CATHETER;  Surgeon: Axel Filler, MD;  Location: [facility] OR;  Service: General;  Laterality: N/A;   LAPAROSCOPY N/A 05/04/2015  Procedure: LAPAROSCOPY DIAGNOSTIC LYSIS OF ADHESIONS;  Surgeon: Abigail Miyamoto, MD;  Location: [facility] OR;  Service: General;  Laterality: N/A;   MINOR REMOVAL OF PERITONEAL DIALYSIS CATHETER N/A 04/28/2015   Procedure:  REMOVAL OF PERITONEAL DIALYSIS CATHETER;  Surgeon: Abigail Miyamoto, MD;  Location: [facility] OR;  Service: General;  Laterality: N/A;   NEPHRECTOMY Left 1974   RENAL BIOPSY Right 2012   REVISON OF ARTERIOVENOUS FISTULA Right 05/26/2015   Procedure: SUPERFICIALIZATION OF ARTERIOVENOUS FISTULA WITH SIDE BRANCH LIGATIONS;  Surgeon: Nada Libman, MD;  Location: [facility] OR;  Service: Vascular;  Laterality: Right;   RIGHT/LEFT HEART CATH AND CORONARY ANGIOGRAPHY N/A 08/22/2021   Procedure: RIGHT/LEFT HEART CATH AND CORONARY  ANGIOGRAPHY;  Surgeon: Lennette Bihari, MD;  [facility]: [facility] INV[facility];  Service: Cardiovascular;  Laterality: N/A;   SKIN CANCER EXCISION     right shoulder   SVT ABLATION N/A 11/23/2016   Procedure: SVT Ablation;  Surgeon: Will Jorja Loa, MD;  [facility]: [facility];  Service: Cardiovascular;  Laterality: N/A;   TEE WITHOUT CARDIOVERSION N/A 10/05/2021   Procedure: TRANSESOPHAGEAL ECHOCARDIOGRAM (TEE);  Surgeon: Alleen Borne, MD;  Location: [facility] OR;  Service: Open Heart Surgery;  Laterality: N/A;   UMBILICAL HERNIA REPAIR N/A 05/18/2014   Procedure: HERNIA REPAIR UMBILICAL ADULT;  Surgeon: Axel Filler, MD;  Location: [facility] OR;  Service: General;  Laterality: N/A;   UMBILICAL HERNIA REPAIR N/A 04/28/2015   Procedure: UMBILICAL HERNIA REPAIR WITH MESH;  Surgeon: Abigail Miyamoto, MD;  Location: [facility] OR;  Service: General;  Laterality: N/A;    Allergies  No Known Allergies  History of Present Illness    GEORGY STAPLE has a PMH of renal transplant 5/18, diabetes mellitus type 2, atrial fibrillation, TIA, morbid obesity, CKD stage III, OSA, and hypertension.  He is status post bypass surgery and AVR with biological prosthesis 10/05/2021 by Dr. Laneta Simmers.  He remains on anticoagulation for paroxysmal atrial fibrillation and history of embolic stroke.  He was on hemodialysis for short period of time and then received a kidney transplant in 2018.  He remains on immunosuppressive therapy.  His PMH also includes SVT which has responded to vagal maneuvers.  He was seen by EP who evaluated by felt an ablation could not be performed.  He was seen by Dr. Royann Shivers on 03/15/2022.  He presented with chest discomfort that occurred during an intense phone conversation/argument.  He was discussing with his immediate supervisor and human resources regarding continuing to work from home.  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  He reported that he became emotional  during conversation and began to notice chest discomfort that radiated to his right arm.  He noted that his blood pressure was 180/90's (10-15 minutes after conversation) and after finishing the conversation his symptoms slowly began to improve.  He contacted the cardiology office and schedule an appointment.  He was seen approximately 4 hours later and he was feeling better.  His angina had resolved.  He did note some tingling in his right hand and a mild headache.  He was admitted to the hospital on 04/30/2022 and discharged on 05/03/2022.  He was diagnosed with acute pyelonephritis.  On 05/01/2022 he developed bilateral facial numbness and right arm numbness with weakness.  A code stroke was called.  His head CT scan showed old infarct with no evidence of acute CVA.  MRI of his brain showed no acute intracranial abnormalities and  was grossly negative.  Neurology was consulted and agreed with stroke work-up.  They recommended a hold of his anticoagulation during hospitalization.  He was started on high-dose statin and his aspirin was continued as outpatient.  Physical therapy evaluated and recommended home PT.  He followed up with Dr. Everlena Cooper neurology on 05/22/2022.  It was not felt that he had a stroke or TIA.  He complained of chronic headaches and dizziness.  He was started on nortriptyline 10 mg at bedtime.  His duloxetine and trazodone were discontinued.  He was instructed to use acetaminophen for his headaches.  Once he finishes physical therapy if his dizziness has not resolved it was planned that he will start vestibular rehab.  Follow-up was planned for 4 months.  He presented to the clinic 05/25/22 for follow-up evaluation stated he continued to feel dizzy.  We reviewed his recent visit to the hospital and his follow-up appointment with neurology.  We reviewed dizziness as it related to the vestibular system.  He expressed understanding.  We reviewed his lab work from the hospital.  He reported that his  blood pressure at home had been somewhat labile.  Initially in clinic  his blood pressure was 166/86 and on recheck it was 134/82.  I recommended that he continue his physical therapy and proceed to vestibular therapy.  I will increased his amlodipine to 7.5 mg daily, gave salty 6 diet sheet, and plan follow-up in 3 to 4 months.  He was seen in follow-up by Dr. Tresa Endo on 07/26/2022.  During that time he noted fatigue.  He was not sleeping well.  He was working virtually.  He was walking with a cane.  He was following at [facility] for his renal transplant and CKD.  His creatinine was noted to be 2.16 on 7/23.  His amlodipine, carvedilol, chlorthalidone, hydralazine were continued for blood pressure management.  He reported compliance with his BiPAP.  His EKG showed normal sinus rhythm 69 bpm  He presents to the clinic today for follow-up evaluation and states he has been noticing elevated blood sugars and has been manually coordinating his blood sugar monitoring and insulin delivery.  He continues to follow with [facility] for his renal transplant.  He reports occasional episodes of dizziness which are managed by resting and Epley maneuvers.  He also reports occasional chest discomfort which he feels is muscle related.  It is variable in its location.  He is limited in his physical activity due to his right knee pain.  He continues to ambulate with a cane.  He is able to do yard work but is only able to stand for short periods of time.  I will continue his current medication regimen, have him increase his physical activity as tolerated, repeat his echocardiogram at the end of July/early August and plan follow-up in 6 months.  His EKG today shows normal sinus rhythm 77 bpm  Today he denies chest pain, shortness of breath, lower extremity edema, fatigue,  melena, hematuria, hemoptysis, diaphoresis, weakness, syncope, orthopnea, and PND.   Home Medications    Prior to Admission medications   Medication Sig Start Date  End Date Taking? Authorizing Provider  acetaminophen (TYLENOL) 325 MG tablet Take 2 tablets (650 mg total) by mouth every 4 (four) hours as needed for moderate pain. 10/15/21   Ardelle Balls, PA-C  amLODipine (NORVASC) 5 MG tablet Take 1 tablet (5 mg total) by mouth daily. Patient taking differently: Take 2.5 mg by mouth daily. 03/12/22 03/07/23  Tresa Endo,  Clovis Pu, MD  apixaban (ELIQUIS) 5 MG TABS tablet Take 1 tablet (5 mg total) by mouth 2 (two) times daily. Stop coumadin, monitor the PT/INR, and start Eliquis when the INR is <2. 01/31/22   Cannon Kettle, PA-C  B-D UF III MINI PEN NEEDLES 31G X 5 MM MISC USE TO INJECT INSULIN 4 TIMES A DAY 05/23/22   Reather Littler, MD  belatacept (NULOJIX) 250 MG SOLR injection Inject 24.5 mLs into the vein every 28 (twenty-eight) days.  08/30/17   [provider]  carvedilol (COREG) 25 MG tablet Take 1 tablet (25 mg total) by mouth 2 (two) times daily with a meal. 10/26/21   Dahal, Melina Schools, MD  chlorthalidone (HYGROTON) 25 MG tablet Take 1 tablet by mouth daily. 04/06/22 04/06/23  [provider]  Continuous Blood Gluc Sensor (FREESTYLE LIBRE 3 SENSOR) MISC SMARTSIG:1 Topical Every 2 Weeks 11/21/21   [provider]  doxazosin (CARDURA) 2 MG tablet Take 0.5 tablets (1 mg total) by mouth daily. Take 0.5 Tablet Daily Patient not taking: Reported on 05/22/2022 02/07/22   Cannon Kettle, PA-C  gabapentin (NEURONTIN) 100 MG capsule Take 200-300 mg by mouth See admin instructions. Taking 200 mg in the AM and 300 mg in the evening    [provider]  hydrALAZINE (APRESOLINE) 100 MG tablet TAKE 1 TABLET BY MOUTH EVERY 8 HOURS Patient taking differently: Take 100 mg by mouth 2 (two) times daily. May take a noon dose if Systolic is > 160 04/06/22   Croitoru, Mihai, MD  insulin aspart (NOVOLOG) 100 UNIT/ML injection Inject 5-20 Units into the skin daily as needed for high blood sugar.    [provider]  insulin regular human  CONCENTRATED (HUMULIN R) 500 UNIT/ML injection Inject 125 Units into the skin every 3 (three) days. Omnipod    [provider]  levothyroxine (SYNTHROID) 112 MCG tablet Take 1 tablet (112 mcg total) by mouth daily. 01/09/22   Reather Littler, MD  mycophenolate (CELLCEPT) 250 MG capsule Take 1,000 mg by mouth every 12 (twelve) hours.     [provider]  NEEDLE, DISP, 18 G (BD HYPODERMIC NEEDLE) 18G X 1" MISC Inject 2 each into the muscle every 7 (seven) days. Use 2 needles once weekly for Testosterone medication. Use 1 needle to draw up medication and 2'nd needle to inject into muscle. 04/20/22   Reather Littler, MD  NEEDLE, DISP, 23 G (BD DISP NEEDLE) 23G X 1" MISC USE TO INJECT TESTOSTERONE MEDICATION 04/20/22   Reather Littler, MD  nitroGLYCERIN (NITROSTAT) 0.4 MG SL tablet Place 0.4 mg under the tongue every 5 (five) minutes as needed for chest pain.    [provider]  NON FORMULARY CPAP/BIPAP  at bedtime  BIPAP per pt    [provider]  nortriptyline (PAMELOR) 10 MG capsule Take 1 capsule (10 mg total) by mouth at bedtime. 05/22/22   Drema Dallas, DO  predniSONE (DELTASONE) 5 MG tablet Take 5 mg by mouth daily with breakfast. Continuously    [provider]  rosuvastatin (CRESTOR) 20 MG tablet Take 1 tablet (20 mg total) by mouth daily. 02/09/22   Reather Littler, MD  senna-docusate (SENOKOT-S) 8.6-50 MG tablet Take 1 tablet by mouth at bedtime as needed for mild constipation. Patient taking differently: Take 1 tablet by mouth every other day. 10/26/21   Lorin Glass, MD  Syringe, Disposable, 1 ML MISC Use to draw up 0.54ml of testosterone medication 04/20/22   Reather Littler, MD  tamsulosin (FLOMAX) 0.4 MG CAPS capsule Take 0.8 mg by mouth daily after supper.    [provider]  Testosterone Cypionate 200 MG/ML SOLN Inject 120 mg as directed once a week. Saturday    [provider]  zolpidem (AMBIEN) 10 MG tablet Take 10 mg by mouth at bedtime as needed  for sleep.    [provider]    Family History    Family History  Problem Relation Age of Onset   Colon polyps Father    Stroke Father    Dementia Father        vascular   Colon cancer Neg Hx    Stomach cancer Neg Hx    He indicated that his mother is deceased. He indicated that his father is deceased. He indicated that all of his three sisters are alive. He indicated that both of his brothers are alive. He indicated that his maternal grandmother is deceased. He indicated that his maternal grandfather is deceased. He indicated that his paternal grandmother is deceased. He indicated that his paternal grandfather is deceased. He indicated that the status of his neg hx is unknown.  Social History    Social History   Socioeconomic History   Marital status: Married    Spouse name: Not on file   Number of children: 3   Years of education: 16   Highest education level: Bachelor's degree (e.g., BA, AB, BS)  Occupational History   Occupation: English as a second language teacher: [employer]  Tobacco Use   Smoking status: Former    Types: Cigars    Quit date: 12/06/2012    Years since quitting: 10.2   Smokeless tobacco: Never  Vaping Use   Vaping Use: Never used  Substance and Sexual Activity   Alcohol use: Not Currently   Drug use: No   Sexual activity: Not on file  Other Topics Concern   Not on file  Social History Narrative   Left handed    Lives with wife   Social Determinants of Health   Financial Resource Strain: Low Risk  (03/21/2022)   Overall Financial Resource Strain (CARDIA)    Difficulty of Paying Living Expenses: Not very hard  Food Insecurity: No Food Insecurity (03/21/2022)   Hunger Vital Sign    Worried About Running Out of Food in the Last Year: Never true    Ran Out of Food in the Last Year: Never true  Transportation Needs: No Transportation Needs (03/21/2022)   PRAPARE - Administrator, Civil Service (Medical): No    Lack of Transportation  (Non-Medical): No  Physical Activity: Not on file  Stress: Not on file  Social Connections: Not on file  Intimate Partner Violence: Not on file     Review of Systems    General:  No chills, fever, night sweats or weight changes.  Cardiovascular:  No chest pain, dyspnea on exertion, edema, orthopnea, palpitations, paroxysmal nocturnal dyspnea. Dermatological: No rash, lesions/masses Respiratory: No cough, dyspnea Urologic: No hematuria, dysuria Abdominal:   No nausea, vomiting, diarrhea, bright red blood per rectum, melena, or hematemesis Neurologic:  No visual changes, wkns, changes in mental status. All other systems reviewed and are otherwise negative except as noted above.  Physical Exam    VS:  BP 122/60   Pulse 77   Ht 5\' 11"  (1.803 m)   Wt 296 lb (134.3 kg)   SpO2 98%   BMI 41.28 kg/m  , BMI Body mass index is 41.28 kg/m. GEN: Well  nourished, well developed, in no acute distress. HEENT: normal. Neck: Supple, no JVD, carotid bruits, or masses. Cardiac: RRR, no murmurs, rubs, or gallops. No clubbing, cyanosis, edema.  Radials/DP/PT 2+ and equal bilaterally.  Respiratory:  Respirations regular and unlabored, clear to auscultation bilaterally. GI: Soft, nontender, nondistended, BS + x 4. MS: no deformity or atrophy. Skin: warm and dry, no rash. Neuro:  Strength and sensation are intact. Psych: Normal affect.  Accessory Clinical Findings    Recent Labs: 05/01/2022: Magnesium 2.3 10/15/2022: ALT 13; Hemoglobin 12.9; Platelets 208 01/29/2023: BUN 36; Creatinine, Ser 2.51; Potassium 3.2; Sodium 138; TSH 1.16   Recent Lipid Panel    Component Value Date/Time   CHOL 116 05/02/2022 0334   TRIG 45 05/02/2022 0334   HDL 52 05/02/2022 0334   CHOLHDL 2.2 05/02/2022 0334   VLDL 9 05/02/2022 0334   LDLCALC 55 05/02/2022 0334    ECG personally reviewed by me today-normal sinus rhythm T wave abnormality prolonged QT interval (QTc 477 MS), 77 bpm    Echocardiogram  05/01/2022  IMPRESSIONS     1. Left ventricular ejection fraction, by estimation, is 60 to 65%. The  left ventricle has normal function. The left ventricle has no regional  wall motion abnormalities. There is moderate left ventricular hypertrophy.  Left ventricular diastolic  parameters are consistent with Grade II diastolic dysfunction  (pseudonormalization).   2. Right ventricular systolic function is normal. The right ventricular  size is normal. There is normal pulmonary artery systolic pressure.   3. Left atrial size was moderately dilated.   4. Right atrial size was mild to moderately dilated.   5. No evidence of mitral valve regurgitation. Moderate mitral annular  calcification.   6. S/p AVR 27 mm Edwards Inspiris Resilia Prosthesis. No significant PVL.  Well seated. AV max 2.3 m/s. Mean gradient 11 mmHg. Normal prosthesis.  Aortic valve regurgitation is not visualized.   7. There is mild dilatation of the ascending aorta, measuring 39 mm.   8. The inferior vena cava is normal in size with greater than 50%  respiratory variability, suggesting right atrial pressure of 3 mmHg.   Comparison(s): No significant change from prior study.  Echocardiogram 7/23 IMPRESSIONS     1. Left ventricular ejection fraction, by estimation, is 60 to 65%. The  left ventricle has normal function. The left ventricle has no regional  wall motion abnormalities. There is moderate left ventricular hypertrophy.  Left ventricular diastolic  parameters are consistent with Grade II diastolic dysfunction  (pseudonormalization).   2. Right ventricular systolic function is normal. The right ventricular  size is normal. There is normal pulmonary artery systolic pressure.   3. Left atrial size was moderately dilated.   4. Right atrial size was mild to moderately dilated.   5. No evidence of mitral valve regurgitation. Moderate mitral annular  calcification.   6. S/p AVR 27 mm Edwards Inspiris Resilia  Prosthesis. No significant PVL.  Well seated. AV max 2.3 m/s. Mean gradient 11 mmHg. Normal prosthesis.  Aortic valve regurgitation is not visualized.   7. There is mild dilatation of the ascending aorta, measuring 39 mm.   8. The inferior vena cava is normal in size with greater than 50%  respiratory variability, suggesting right atrial pressure of 3 mmHg.   Comparison(s): No significant change from prior study.   FINDINGS   Left Ventricle: Left ventricular ejection fraction, by estimation, is 60  to 65%. The left ventricle has normal function. The left ventricle has no  regional wall motion abnormalities. The left ventricular internal cavity  size was normal in size. There is   moderate left ventricular hypertrophy. Abnormal (paradoxical) septal  motion consistent with post-operative status. Left ventricular diastolic  parameters are consistent with Grade II diastolic dysfunction  (pseudonormalization).   Right Ventricle: The right ventricular size is normal. Right vetricular  wall thickness was not well visualized. Right ventricular systolic  function is normal. There is normal pulmonary artery systolic pressure.  The tricuspid regurgitant velocity is 2.62  m/s, and with an assumed right atrial pressure of 3 mmHg, the estimated  right ventricular systolic pressure is 30.5 mmHg.   Left Atrium: Left atrial size was moderately dilated.   Right Atrium: Right atrial size was mild to moderately dilated.   Pericardium: There is no evidence of pericardial effusion.   Mitral Valve: Moderate mitral annular calcification. No evidence of mitral  valve regurgitation. MV peak gradient, 9.2 mmHg. The mean mitral valve  gradient is 4.0 mmHg.   Tricuspid Valve: The tricuspid valve is normal in structure. Tricuspid  valve regurgitation is trivial.   Aortic Valve: S/p AVR 27 mm Edwards Inspiris Resilia Prosthesis. No  significant PVL. Well seated. AV max 2.3 m/s. Mean gradient 11 mmHg.   Normal prosthesis. Aortic valve regurgitation is not visualized. Aortic  valve mean gradient measures 11.0 mmHg.  Aortic valve peak gradient measures 22.2 mmHg. Aortic valve area, by VTI  measures 2.73 cm.   Pulmonic Valve: The pulmonic valve was not well visualized. Pulmonic valve  regurgitation is not visualized.   Aorta: There is mild dilatation of the ascending aorta, measuring 39 mm.   Venous: The inferior vena cava is normal in size with greater than 50%  respiratory variability, suggesting right atrial pressure of 3 mmHg.       Assessment & Plan   1.  Essential hypertension-BP today 122/66.   Continue current medical therapy Maintain blood pressure log Increase physical activity as tolerated Low-sodium diet-reviewed  Coronary artery disease-denies recent episodes of chest pain.  Status post CABG 10/05/2021 with AVR.  Does note occasional episodes of musculoskeletal type discomfort. Continue amlodipine, carvedilol, doxazosin, rosuvastatin Heart healthy low-sodium high-fiber diet  Dizziness-stable.  Double vision right eye. Better with rest and Epley maneuvers Maintain p.o. hydration Change positions slowly  Paroxysmal atrial fibrillation-heart rate today 77.  Denies bleeding issues.  Compliant with apixaban. Continue apixaban, carvedilol Avoid triggers caffeine, chocolate, EtOH, dehydration etc.  Aortic stenosis-status post AVR.  Echocardiogram 7/23 showed well-seated normal prosthetic aortic valve. Order Echo 8/25   Renal transplant-was transplanted 5/18.  Creatinine 01/29/2023 was 2.51 Follows with [facility].  Disposition: Follow-up with Dr. Tresa Endo in 6 months.   Thomasene Ripple. Ridley Schewe NP-C     03/01/2023, 10:18 AM [facility] [address] Office [phone] Fax [phone]  Notice: This dictation was prepared with Dragon dictation along with smaller phrase technology. Any transcriptional errors that  result from this process are unintentional and may not be corrected upon review.  I spent 14 minutes examining this patient, reviewing medications, and using patient centered shared decision making involving her cardiac care.  Prior to her visit I spent greater than 20 minutes reviewing her past medical history,  medications, and prior cardiac tests.

## 2023-03-01 ENCOUNTER — Ambulatory Visit: Payer: 59 | Attending: Nurse Practitioner | Admitting: General Practice

## 2023-03-01 ENCOUNTER — Encounter: Payer: Self-pay | Admitting: General Practice

## 2023-03-01 VITALS — BP 122/60 | HR 77 | Ht 71.0 in | Wt 296.0 lb

## 2023-03-01 DIAGNOSIS — Z940 Kidney transplant status: Secondary | ICD-10-CM

## 2023-03-01 DIAGNOSIS — I480 Paroxysmal atrial fibrillation: Secondary | ICD-10-CM | POA: Diagnosis not present

## 2023-03-01 DIAGNOSIS — R42 Dizziness and giddiness: Secondary | ICD-10-CM | POA: Diagnosis not present

## 2023-03-01 DIAGNOSIS — Z952 Presence of prosthetic heart valve: Secondary | ICD-10-CM

## 2023-03-01 DIAGNOSIS — I10 Essential (primary) hypertension: Secondary | ICD-10-CM | POA: Diagnosis not present

## 2023-03-01 DIAGNOSIS — Z951 Presence of aortocoronary bypass graft: Secondary | ICD-10-CM

## 2023-03-01 NOTE — Patient Instructions (Addendum)
Medication Instructions:  No changes  *If you need a refill on your cardiac medications before your next appointment, please call your pharmacy*   Lab Work: Not needed    Testing/Procedures: Will be schedule at [facility] [address]- First part of Aug 2024 Your physician has requested that you have an echocardiogram. Echocardiography is a painless test that uses sound waves to create images of your heart. It provides your doctor with information about the size and shape of your heart and how well your heart's chambers and valves are working. This procedure takes approximately one hour. There are no restrictions for this procedure. Please do NOT wear cologne, perfume, aftershave, or lotions (deodorant is allowed). Please arrive 15 minutes prior to your appointment time.   Follow-Up: At [facility], you and your health needs are our priority.  As part of our continuing mission to provide you with exceptional heart care, we have created designated Provider Care Teams.  These Care Teams include your primary Cardiologist (physician) and Advanced Practice Providers (APPs -  Physician Assistants and Nurse Practitioners) who all work together to provide you with the care you need, when you need it.     Your next appointment:   6 month(s)  The format for your next appointment:   In Person  Provider:   Nicki Guadalajara, MD  or Edd Fabian, FNP       Other Instructions

## 2023-03-19 ENCOUNTER — Ambulatory Visit (INDEPENDENT_AMBULATORY_CARE_PROVIDER_SITE_OTHER): Payer: 59 | Admitting: Podiatry

## 2023-03-19 ENCOUNTER — Encounter: Payer: Self-pay | Admitting: Podiatry

## 2023-03-19 DIAGNOSIS — M79674 Pain in right toe(s): Secondary | ICD-10-CM

## 2023-03-19 DIAGNOSIS — B351 Tinea unguium: Secondary | ICD-10-CM

## 2023-03-19 DIAGNOSIS — E1149 Type 2 diabetes mellitus with other diabetic neurological complication: Secondary | ICD-10-CM

## 2023-03-19 DIAGNOSIS — M79675 Pain in left toe(s): Secondary | ICD-10-CM | POA: Diagnosis not present

## 2023-03-19 DIAGNOSIS — N189 Chronic kidney disease, unspecified: Secondary | ICD-10-CM

## 2023-03-19 NOTE — Progress Notes (Signed)
This patient returns to my office for at risk foot care.  This patient requires this care by a professional since this patient will be at risk due to having uncontrolled diabetes and kidney transplant.  This patient is unable to cut nails himself since the patient cannot reach his nails.These nails are painful walking and wearing shoes.  Resolved heel fissure.   This patient presents for at risk foot care today.  General Appearance  Alert, conversant and in no acute stress.  Vascular  Dorsalis pedis and posterior tibial  pulses are palpable  bilaterally.  Capillary return is within normal limits  bilaterally. Temperature is within normal limits  bilaterally.  Neurologic  Senn-Weinstein monofilament wire test diminished   bilaterally. Muscle power within normal limits bilaterally.  Nails Thick disfigured discolored nails with subungual debris  from hallux to fifth toes bilaterally. No evidence of bacterial infection or drainage bilaterally.  Orthopedic  No limitations of motion  feet .  No crepitus or effusions noted.  No bony pathology or digital deformities noted. HAV  B/L.  Skin  normotropic skin with no porokeratosis noted bilaterally.  No signs of infections or ulcers noted.     Onychomycosis  Pain in right toes  Pain in left toes   Consent was obtained for treatment procedures.   Mechanical debridement of nails 1-5  bilaterally performed with a nail nipper.  Filed with dremel without incident.     Return office visit  3 months                   Told patient to return for periodic foot care and evaluation due to potential at risk complications.   Jimy Gates DPM  

## 2023-03-22 ENCOUNTER — Other Ambulatory Visit: Payer: Self-pay | Admitting: Neurology

## 2023-03-22 ENCOUNTER — Other Ambulatory Visit: Payer: Self-pay | Admitting: Endocrinology

## 2023-03-22 DIAGNOSIS — E782 Mixed hyperlipidemia: Secondary | ICD-10-CM

## 2023-03-27 ENCOUNTER — Other Ambulatory Visit: Payer: Self-pay | Admitting: Cardiovascular Disease

## 2023-04-01 ENCOUNTER — Encounter: Payer: Self-pay | Admitting: Neurology

## 2023-04-16 ENCOUNTER — Telehealth: Payer: Self-pay | Admitting: Cardiovascular Disease

## 2023-04-16 NOTE — Telephone Encounter (Signed)
  Pt c/o swelling: STAT is pt has developed SOB within 24 hours  If swelling, where is the swelling located? Both feet, right leg and right arm   How much weight have you gained and in what time span?   Have you gained 3 pounds in a day or 5 pounds in a week?   Do you have a log of your daily weights (if so, list)?   Are you currently taking a fluid pill? Yes   Are you currently SOB? Yes   Have you traveled recently? No   Pt is calling, she said, a week ago he felt SOB and feel like being choked, while he's sitting down or standing or anything all day and all night. He has some swelling as well, he would like to speak with Dr. Tresa Endo or his nurse. He said, if he didn't answer to leave him a message

## 2023-04-16 NOTE — Telephone Encounter (Signed)
Patient states he feels SOB and choking all day approx 2 weeks . SOB is all day and night.  States if he wears a shirt he feels like he's choking.  Can't sleep and has to se BiPAP but then wakes up and has to sleep in recliner.  Has productive cough of thick clear sputum.  Patient states edema bilateral feet, Right leg and Right Arm. For approx 2 weeks. Patient states weight 294 two weeks ago.  Takes chlorthalidone 25 mg Prednisone 5mg  Daily R/T transplant if Kidney  Patient is set to see DOD in [address] tomorrow for acute issues

## 2023-04-17 ENCOUNTER — Ambulatory Visit: Payer: 59 | Attending: Cardiology | Admitting: Cardiology

## 2023-04-17 ENCOUNTER — Encounter: Payer: Self-pay | Admitting: Cardiology

## 2023-04-17 VITALS — BP 124/76 | HR 63 | Ht 71.0 in | Wt 289.4 lb

## 2023-04-17 DIAGNOSIS — I480 Paroxysmal atrial fibrillation: Secondary | ICD-10-CM

## 2023-04-17 DIAGNOSIS — Z952 Presence of prosthetic heart valve: Secondary | ICD-10-CM

## 2023-04-17 DIAGNOSIS — I5033 Acute on chronic diastolic (congestive) heart failure: Secondary | ICD-10-CM

## 2023-04-17 NOTE — Progress Notes (Unsigned)
  Cardiology Office Note:  .   Date:  04/17/2023  ID:  Robert [name], DOB 01-10-1961, MRN [id number] PCP: Gaspar Garbe, Robert [name]  [facility] Providers Cardiologist:  Robert Guadalajara, Robert [name]    History of Present Illness: .   Robert [name] is a 62 y.o. male here for evaluation of edema and shortness of breath.  Sees Robert [name].  Called in today feeling shortness of breath and choking for approximately 2 weeks.  Has been short of breath all day at night.  If he wears a shirt he feels as though he is choking.  Has trouble sleeping BiPAP.  Has to sleep in recliner.  Thick clear sputum.  Bilateral feet edema. Edema is half of what it was. Cutting back on water.   Weight at home 294 now 289 better.  Chlorthalidone 25 Prednisone 5 mg following kidney transplant  Has not been taking lasix 40mg . PRN only. [facility] says just for acute time periods.   Creat 2.5  SVT-ablation tried Camnitz did not trigger.  AFIB - Eliquis  Aug 21 have ultrasounds scheduled.   Prior aortic valve valve replacement Robert [name].  Echocardiogram 65% 2023 EF.  ROS: No syncope.  Studies Reviewed: .         Risk Assessment/Calculations:            Physical Exam:   VS:  BP 124/76   Pulse 63   Ht 5\' 11"  (1.803 m)   Wt 289 lb 6.4 oz (131.3 kg)   BMI 40.36 kg/m    Wt Readings from Last 3 Encounters:  04/17/23 289 lb 6.4 oz (131.3 kg)  03/01/23 296 lb (134.3 kg)  02/01/23 295 lb 4 oz (133.9 kg)    GEN: Well nourished, well developed in no acute distress NECK: No JVD; No carotid bruits CARDIAC: RRR, soft systolic murmur, rubs, gallops RESPIRATORY:  Clear to auscultation without rales, wheezing or rhonchi  ABDOMEN: Soft, non-tender, non-distended EXTREMITIES: Mild bilateral edema; No deformity   ASSESSMENT AND PLAN: .    Acute on chronic diastolic heart failure - Take Lasix 40 mg twice daily for 2 days.  He will call [facility] transplant center to make sure that this is okay.  I think he is  starting to feel better today.  Continue with fluid restriction. -Continue with carvedilol. -Doubt he would be a good candidate for SGLT2 inhibitor given his transplant state. -If symptoms worsen or become more worrisome, seek further medical care, hospital if necessary.  Aortic valve replacement - Stable 2023 echocardiogram.  Status post renal transplant - [facility].  Hyperlipidemia - Crestor 20.  Hypertension - Continue with chlorthalidone.  Hydralazine.  Amlodipine 7.5 mg.  Paroxysmal atrial fibrillation - On Eliquis 5 mg twice a day.  Most recent ECG shows sinus rhythm.  We will try to get close follow-up with Robert [name] at [place].          Signed, Robert Schultz, Robert [name]

## 2023-04-17 NOTE — Patient Instructions (Signed)
Medication Instructions:  Please take Furosemide 40 mg twice a day for 2 days. Continue all other medications as listed.  *If you need a refill on your cardiac medications before your next appointment, please call your pharmacy*  Follow-Up: At [facility], you and your health needs are our priority.  As part of our continuing mission to provide you with exceptional heart care, we have created designated Provider Care Teams.  These Care Teams include your primary Cardiologist (physician) and Advanced Practice Providers (APPs -  Physician Assistants and Nurse Practitioners) who all work together to provide you with the care you need, when you need it.  We recommend signing up for the patient portal called "MyChart".  Sign up information is provided on this After Visit Summary.  MyChart is used to connect with patients for Virtual Visits (Telemedicine).  Patients are able to view lab/test results, encounter notes, upcoming appointments, etc.  Non-urgent messages can be sent to your provider as well.   To learn more about what you can do with MyChart, go to [email or web address].    Your next appointment:   Next available  Provider:   Edd Fabian, FNP

## 2023-04-26 ENCOUNTER — Other Ambulatory Visit: Payer: Self-pay | Admitting: Endocrinology

## 2023-04-26 DIAGNOSIS — E1165 Type 2 diabetes mellitus with hyperglycemia: Secondary | ICD-10-CM

## 2023-04-29 ENCOUNTER — Other Ambulatory Visit (INDEPENDENT_AMBULATORY_CARE_PROVIDER_SITE_OTHER): Payer: 59

## 2023-04-29 DIAGNOSIS — E291 Testicular hypofunction: Secondary | ICD-10-CM

## 2023-04-29 DIAGNOSIS — Z794 Long term (current) use of insulin: Secondary | ICD-10-CM | POA: Diagnosis not present

## 2023-04-29 DIAGNOSIS — E1165 Type 2 diabetes mellitus with hyperglycemia: Secondary | ICD-10-CM | POA: Diagnosis not present

## 2023-04-29 LAB — BASIC METABOLIC PANEL
BUN: 43 mg/dL — ABNORMAL HIGH (ref 6–23)
CO2: 22 mEq/L (ref 19–32)
Calcium: 9.5 mg/dL (ref 8.4–10.5)
Chloride: 102 mEq/L (ref 96–112)
Creatinine, Ser: 2.6 mg/dL — ABNORMAL HIGH (ref 0.40–1.50)
GFR: 25.37 mL/min — ABNORMAL LOW (ref >60.00)
Glucose, Bld: 282 mg/dL — ABNORMAL HIGH (ref 70–99)
Potassium: 3.3 mEq/L — ABNORMAL LOW (ref 3.5–5.1)
Sodium: 138 mEq/L (ref 135–145)

## 2023-04-29 LAB — HEMOGLOBIN A1C: Hgb A1c MFr Bld: 7.6 % — ABNORMAL HIGH (ref 4.6–6.5)

## 2023-04-29 LAB — TESTOSTERONE: Testosterone: 621.73 ng/dL (ref 300.00–890.00)

## 2023-05-02 ENCOUNTER — Ambulatory Visit (INDEPENDENT_AMBULATORY_CARE_PROVIDER_SITE_OTHER): Payer: 59 | Admitting: Endocrinology

## 2023-05-02 ENCOUNTER — Telehealth: Payer: Self-pay

## 2023-05-02 ENCOUNTER — Encounter: Payer: Self-pay | Admitting: Endocrinology

## 2023-05-02 VITALS — BP 136/84 | HR 70 | Ht 71.0 in | Wt 290.6 lb

## 2023-05-02 DIAGNOSIS — E039 Hypothyroidism, unspecified: Secondary | ICD-10-CM | POA: Diagnosis not present

## 2023-05-02 DIAGNOSIS — Z794 Long term (current) use of insulin: Secondary | ICD-10-CM | POA: Diagnosis not present

## 2023-05-02 DIAGNOSIS — E1165 Type 2 diabetes mellitus with hyperglycemia: Secondary | ICD-10-CM | POA: Diagnosis not present

## 2023-05-02 DIAGNOSIS — E291 Testicular hypofunction: Secondary | ICD-10-CM

## 2023-05-02 NOTE — Patient Instructions (Signed)
Bolus for all meals 

## 2023-05-02 NOTE — Progress Notes (Signed)
Patient ID: Robert [name], male   DOB: 1961/01/25, 62 y.o.   MRN: [id number]     Reason for Appointment: Endocrinology follow-up  History of Present Illness   DIABETES diagnosis date:  2012  Previous history: His blood sugar has been difficult to control since onset. Initially was taking oral hypoglycemic drugs like glyburide but could not take metformin because of renal dysfunction. Since Victoza did not help his control he was started on Lantus and then U 500 insulin also His A1c has previously ranged from 7.8-9.8, the lowest level in 03/2013 He was on Victoza but because of his markedly decreased appetite and weight loss this was stopped in 9/15  Recent history:   Non-hypoglycemic insulin regimen: None  OMNIPOD pump settings with U-500 insulin:   Midnight = 0.15, 6 AM = 2.2, 11 AM = 1.4 and 5 PM = 0.20,  Bolus settings: Insulin to carb ratio 1:5 Sensitivity or ISF 60  Carbohydrate ratio 1:10, correction factor I: 60   His A1c is 7.6, was 7.6  Fructosamine previously 280  Current blood sugar patterns, management and problems identified:  He has reduced his basal rates in the evenings and overnight significantly since his last visit Also did not change his sensitivity has recommended This is causing generally very high readings overnight again and mostly high during the day also He has also gone up on his 6 AM basal rate and with this his blood sugars tend to come down mid to late morning including some hypoglycemia midday or afternoon Overall his blood sugars are fluctuating a lot and during the day has several periods of hyperglycemia also He says he is not watching his diet or snacks He will again not bolus for his carbohydrates as recommended Only has sporadic boluses of 1 to 2 units for correction  Not clear if he is doing any regular boluses for meals with the NovoLog pen, likely not because of not refilling this lately Has not been on the OmniPod 5  because of the cost of the Dexcom   Proper timing of medications in relation to meals: Yes.          Blood sugar data analysis from freestyle libre version 3 for the last 2 weeks through April 26:  His blood sugars are the highest overnight peaking between 1 AM and 4 AM with average 205; lowest blood sugars overall are about 10 AM with average 158 Overnight blood sugars are fairly consistently high with averages in the low 200 range but has moderate variability with occasional readings in the target range also and periodically low normal readings Pre-meal blood sugars are also averaging close to 180 or 200 at lunch and dinner During the day blood sugars show extreme variability with some hypoglycemia midmorning or afternoon and frequently.  2 of prolonged hyperglycemia the rest of the day and generally averaging in the low 200 No clear-cut postprandial spikes are seen but usually having hyperglycemia preceded by hypoglycemia or low normal readings    CGM use % of time 86  2-week average/GV 192/40  Time in range        % 47  % Time Above 180 25  % Time above 250 25  % Time Below 70 3 3     Previously:  CGM use % of time   2-week average/GV 185  Time in range      46%  % Time Above 180 36  % Time above 250 16  %  Time Below 70 2     PRE-MEAL Fasting Lunch Dinner Bedtime 2 a  Glucose range:       Averages: 211 168 139  224   POST-MEAL PC Breakfast PC Lunch PC Dinner  Glucose range:     Averages: 211 147 155   Previously  CGM use % of time   2-week average/GV 151  Time in range        75%  % Time Above 180 24  % Time above 250   % Time Below 70 1     PRE-MEAL Fasting Lunch Dinner Bedtime Overall  Glucose range:       Averages: 146    151   POST-MEAL PC Breakfast PC Lunch PC Dinner  Glucose range:     Averages:  144 155    Meals: 2-3 meals per day. 7-8 am and Noon and Dinner 5-7 PM .              Weight control:   Wt Readings from Last 3 Encounters:   05/02/23 290 lb 9.6 oz (131.8 kg)  04/17/23 289 lb 6.4 oz (131.3 kg)  03/01/23 296 lb (134.3 kg)        Complications:   nephropathy, neuropathy   Diabetes labs:   Lab Results  Component Value Date   HGBA1C 7.6 (H) 04/29/2023   HGBA1C 7.6 (H) 01/29/2023   HGBA1C 6.9 (H) 10/29/2022   Lab Results  Component Value Date   MICROALBUR 146.3 (H) 08/03/2019   LDLCALC 55 05/02/2022   CREATININE 2.60 (H) 04/29/2023    Lab Results  Component Value Date   FRUCTOSAMINE 280 10/29/2022   FRUCTOSAMINE 295 (H) 11/28/2021   FRUCTOSAMINE 296 (H) 07/04/2021   Lab Results  Component Value Date   HGB 12.9 (L) 10/15/2022       OTHER problems: See review of systems    Allergies as of 05/02/2023   No Known Allergies      Medication List        Accurate as of May 02, 2023  4:47 PM. If you have any questions, ask your nurse or doctor.          acetaminophen 325 MG tablet Commonly known as: TYLENOL Take 2 tablets (650 mg total) by mouth every 4 (four) hours as needed for moderate pain.   albuterol 108 (90 Base) MCG/ACT inhaler Commonly known as: VENTOLIN HFA Inhale 1 puff into the lungs. Every hour as needed   amLODipine 5 MG tablet Commonly known as: NORVASC TAKE 1.5 TABLETS BY MOUTH DAILY.   apixaban 5 MG Tabs tablet Commonly known as: Eliquis Take 1 tablet (5 mg total) by mouth 2 (two) times daily.   B-D UF III MINI PEN NEEDLES 31G X 5 MM Misc Generic drug: Insulin Pen Needle USE TO INJECT INSULIN 4 TIMES A DAY   BD Hypodermic Needle 18G X 1" Misc Generic drug: NEEDLE (DISP) 18 G USE AS DIRECTED TO INJECT 0.6MLS OF TESTOSTERONE EVERY 7 DAYS (DRAW UP)   BD Plastipak Syringe 21G X 1" 3 ML Misc Generic drug: SYRINGE-NEEDLE (DISP) 3 ML USE AS DIRECTED TO INJECT 0.6MLS OF TESTOSTERONE EVERY 7 DAYS (INJECT)   carvedilol 25 MG tablet Commonly known as: COREG Take 1 tablet (25 mg total) by mouth 2 (two) times daily with a meal.   chlorthalidone 25 MG  tablet Commonly known as: HYGROTON Take 1 tablet by mouth daily.   doxazosin 2 MG tablet Commonly known as: CARDURA TAKE 0.5 TABLETS (  1 MG TOTAL) BY MOUTH DAILY. TAKE 0.5 TABLET DAILY   DULoxetine 30 MG capsule Commonly known as: CYMBALTA Take 30 mg by mouth daily.   FreeStyle Libre 3 Sensor Misc SMARTSIG:1 TOPICAL EVERY 2 WEEKS   gabapentin 100 MG capsule Commonly known as: NEURONTIN Take 200-300 mg by mouth See admin instructions. Taking 200 mg in the AM and 300 mg in the evening   HUMULIN R 500 UNIT/ML injection Generic drug: insulin regular human CONCENTRATED Inject 125 Units into the skin every 3 (three) days. Omnipod   hydrALAZINE 100 MG tablet Commonly known as: APRESOLINE TAKE 1 TABLET BY MOUTH EVERY 8 HOURS   insulin aspart 100 UNIT/ML injection Commonly known as: novoLOG Inject 5-20 Units into the skin daily as needed for high blood sugar.   levothyroxine 112 MCG tablet Commonly known as: SYNTHROID TAKE 1 TABLET BY MOUTH EVERY DAY   mupirocin ointment 2 % Commonly known as: BACTROBAN 1 application Externally Twice a day for 7 days   mycophenolate 250 MG capsule Commonly known as: CELLCEPT Take 500 mg by mouth every 12 (twelve) hours.   nitroGLYCERIN 0.4 MG SL tablet Commonly known as: NITROSTAT Place 0.4 mg under the tongue every 5 (five) minutes as needed for chest pain.   NON FORMULARY CPAP/BIPAP  at bedtime  BIPAP per pt   Nulojix 250 MG Solr injection Generic drug: belatacept Inject 24.5 mLs into the vein every 28 (twenty-eight) days.   predniSONE 5 MG tablet Commonly known as: DELTASONE Take 5 mg by mouth daily with breakfast. Continuously   rosuvastatin 20 MG tablet Commonly known as: CRESTOR TAKE 1 TABLET BY MOUTH EVERY DAY   senna-docusate 8.6-50 MG tablet Commonly known as: Senokot-S Take 1 tablet by mouth at bedtime as needed for mild constipation. What changed: when to take this   Syringe (Disposable) 1 ML Misc Use to draw up  0.67ml of testosterone medication   tamsulosin 0.4 MG Caps capsule Commonly known as: FLOMAX Take 0.8 mg by mouth daily after supper.   Testosterone Cypionate 200 MG/ML Soln Inject 120 mg as directed once a week. Saturday   traMADol 50 MG tablet Commonly known as: ULTRAM 1 tablet Oral Once a day for 90 days   traZODone 50 MG tablet Commonly known as: DESYREL 1 tablet at bedtime as needed Orally Once a day (Do not take if take ambien)   zolpidem 10 MG tablet Commonly known as: AMBIEN Take 10 mg by mouth at bedtime as needed for sleep.   zonisamide 25 MG capsule Commonly known as: ZONEGRAN TAKE 3 CAPSULES (75 MG TOTAL) BY MOUTH DAILY        Allergies: No Known Allergies  Past Medical History:  Diagnosis Date   Acute cystitis without hematuria 11/09/2021   AKI (acute kidney injury) 08/08/2017   Elevated sCr to 2.5 (baseline ~1.7) noted at OSH - Subsequent transfer to [facility]  - Currently on IVMF in the setting of diarrhea and AKI - sCr at baseline at time of discharge   Anemia in chronic kidney disease 03/30/2015   Anticoagulated on Coumadin 11/09/2021   Aortic valve stenosis    Arthritis    Atrial fibrillation 11/29/2021   Benign essential hypertension 07/30/2014   prior to transplant pt took Metorpolol tartrate 50mg  qd on off HD days   Benign neoplasm of transverse colon    Cancer    skin ca right shoulder, plastic dsyplasia, pre-Ca polpys removed on Colonoscopy- 07/2014   Cerebrovascular accident (CVA) due to embolism of  precerebral artery    Chronic renal failure    Colon polyps 07/23/2014   Tubular adenomas x 5   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   Diverticulosis of colon without hemorrhage    Eczema    ESRD (end stage renal  disease)    hemodialysis 06/2014-02/2017, s/p living unrelated kidney transplant 02/26/17   Exertional dyspnea    Fatigue 01/08/2014   Fissure of skin 03/08/2020   GI bleed 07/26/2017   Headache    Heart murmur    aortic stenosis (moderate-severe 04/2021)   History of COVID-19 2023   History of unilateral nephrectomy 09/06/2013   Hydrocele, acquired 04/07/2017   Hyperlipidemia 09/06/2013   Hypertensive urgency 10/21/2021   Hyponatremia 11/09/2021   Hypothyroidism    Immunosuppressed status 02/26/2017   Induction agent: Solumedrol - Envarsus 3mg  daily  - Home dose decreased from 5mg  to 3mg  in the setting of FK 15.7 while inpatient   - Previously on Prograf, though d/c'ed during previous admission; suspected this may be causing hallucinations  - Cellcept 1000mg  Q12H  - Prednisone 5mg  daily   Inguinal hernia without obstruction or gangrene 06/30/2017   Right inguinal wall defect appreciated by CT imaging [place] and on exam Dr. Roby Lofts today Dr. Roby Lofts has discussed hernia repair with mesh at some point in near future  Last Assessment & Plan:  Formatting of this note might be different from the original. Right inguinal wall defect appreciated by CT imaging [place] and on exam Dr. Roby Lofts today Dr. Roby Lofts has discussed hern   Kidney transplanted 02/26/2017   Date of Transplant = 02/26/17  ABO (Donor/Recipient) = compatible Blood type: O+/A+ DonorType: Living donor, unrelated Allograft type:Left kidney Donor anatomy: 2 arteries, 1 veins and single ureter  Pants-to-skirt anastomosis of dual renal arteries on the backtable. Donor Kidney Bx: Not applicable Allograft injury/complications: None Ne   Lower extremity edema 09/06/2013   Macular degeneration    Mild vascular neurocognitive disorder 02/08/2022   OAB (overactive bladder) 12/28/2019   Obstructive sleep apnea 09/06/2013   On Bi-PAP  Last Assessment & Plan:  Formatting of this note might be different from the original. - History of OSA, continue CPAP  at night   Peritoneal dialysis catheter in place 02/24/2015   RLQ abdominal pain 04/19/2020   S/P AVR (aortic valve replacement) 10/05/2021   SBO (small bowel obstruction) 05/2015   Scrotal edema 03/14/2017   Testicular ultrasound did show hydrocele  Will refer to urology regarding ongoign swelling, hydrocele.   Last Assessment & Plan:  Formatting of this note might be different from the original.   Testicular ultrasound did show hydrocele  Will refer to urology regarding ongoign swelling, hydrocele.   Shortness of breath    with exertion   Stage 3b chronic kidney disease (CKD) 03/22/2014   SVT (supraventricular tachycardia) 09/06/2013   Tremor of both hands 06/29/2017   Last Assessment & Plan:  Present since transplant, stable and unchanged Query from tacrolimus -Improved tremors noted and verbalized by pt - Prograf doses titrated per FK levels  Last Assessment & Plan:  Formatting of this note might be different from the original. Present since transplant, stable and unchanged Query from tacrolimus -Improved tremors noted and verbalized by pt - Prograf doses titr  Type 2 diabetes mellitus with hyperglycemia, with long-term current use of insulin 07/14/2015   Umbilical hernia    s/p repair 04/28/15    Past Surgical History:  Procedure Laterality Date   AORTIC VALVE REPLACEMENT N/A 10/05/2021   Procedure: AORTIC VALVE REPLACEMENT (AVR) WITH INSPIRIS RESILIA AORTIC VALVE SIZE ;  Surgeon: Alleen Borne, MD;  Location: [facility] OR;  Service: Open Heart Surgery;  Laterality: N/A;   AV FISTULA PLACEMENT Right 02/25/2015   Procedure: RIGHT ARTERIOVENOUS (AV) FISTULA CREATION;  Surgeon: Nada Libman, MD;  Location: [facility] OR;  Service: Vascular;  Laterality: Right;   AV FISTULA PLACEMENT Right 09/15/2015   Procedure: ARTERIOVENOUS (AV) FISTULA CREATION- RIGHT ARM;  Surgeon: Nada Libman, MD;  Location: [facility] OR;  Service: Vascular;  Laterality: Right;   CAPD INSERTION N/A 05/18/2014   Procedure:  LAPAROSCOPIC INSERTION CONTINUOUS AMBULATORY PERITONEAL DIALYSIS  (CAPD) CATHETER;  Surgeon: Axel Filler, MD;  Location: [facility] OR;  Service: General;  Laterality: N/A;   COLONOSCOPY     COLONOSCOPY WITH PROPOFOL N/A 07/12/2016   Procedure: COLONOSCOPY WITH PROPOFOL;  Surgeon: Rachael Fee, MD;  Location: [facility] ENDOSCOPY;  Service: Endoscopy;  Laterality: N/A;   CORONARY ARTERY BYPASS GRAFT N/A 10/05/2021   Procedure: CORONARY ARTERY BYPASS GRAFTING (CABG) X 1, ON PUMP, USING LEFT INTERNAL MAMMARY ARTERY CONDUIT;  Surgeon: Alleen Borne, MD;  Location: [facility] OR;  Service: Open Heart Surgery;  Laterality: N/A;   declotting of fistula  08/2015   ESOPHAGOGASTRODUODENOSCOPY (EGD) WITH PROPOFOL N/A 07/12/2016   Procedure: ESOPHAGOGASTRODUODENOSCOPY (EGD) WITH PROPOFOL;  Surgeon: Rachael Fee, MD;  Location: [facility] ENDOSCOPY;  Service: Endoscopy;  Laterality: N/A;   FISTULA SUPERFICIALIZATION Right 02/07/2016   Procedure: RIIGHT UPPER ARM FISTULA SUPERFICIALIZATION;  Surgeon: Sherren Kerns, MD;  Location: [facility] OR;  Service: Vascular;  Laterality: Right;   IJ catheter insertion     INSERTION OF DIALYSIS CATHETER Right 02/25/2015   Procedure: INSERTION OF RIGHT INTERNAL JUGULAR DIALYSIS CATHETER;  Surgeon: Nada Libman, MD;  Location: [facility] OR;  Service: Vascular;  Laterality: Right;   KIDNEY TRANSPLANT     LAPAROSCOPIC REPOSITIONING CAPD CATHETER N/A 06/16/2014   Procedure: LAPAROSCOPIC REPOSITIONING CAPD CATHETER;  Surgeon: Axel Filler, MD;  Location: [facility] OR;  Service: General;  Laterality: N/A;   LAPAROSCOPY N/A 05/04/2015   Procedure: LAPAROSCOPY DIAGNOSTIC LYSIS OF ADHESIONS;  Surgeon: Abigail Miyamoto, MD;  Location: [facility] OR;  Service: General;  Laterality: N/A;   MINOR REMOVAL OF PERITONEAL DIALYSIS CATHETER N/A 04/28/2015   Procedure:  REMOVAL OF PERITONEAL DIALYSIS CATHETER;  Surgeon: Abigail Miyamoto, MD;  Location: [facility] OR;  Service: General;  Laterality: N/A;   NEPHRECTOMY Left 1974   RENAL BIOPSY  Right 2012   REVISON OF ARTERIOVENOUS FISTULA Right 05/26/2015   Procedure: SUPERFICIALIZATION OF ARTERIOVENOUS FISTULA WITH SIDE BRANCH LIGATIONS;  Surgeon: Nada Libman, MD;  Location: [facility] OR;  Service: Vascular;  Laterality: Right;   RIGHT/LEFT HEART CATH AND CORONARY ANGIOGRAPHY N/A 08/22/2021   Procedure: RIGHT/LEFT HEART CATH AND CORONARY ANGIOGRAPHY;  Surgeon: Lennette Bihari, MD;  [facility]: [facility];  Service: Cardiovascular;  Laterality: N/A;   SKIN CANCER EXCISION     right shoulder   SVT ABLATION N/A 11/23/2016   Procedure: SVT Ablation;  Surgeon: Will Jorja Loa, MD;  [facility];  Service: Cardiovascular;  Laterality: N/A;   TEE WITHOUT CARDIOVERSION N/A 10/05/2021   Procedure: TRANSESOPHAGEAL ECHOCARDIOGRAM (TEE);  Surgeon: Alleen Borne, MD;  Location: [facility]  OR;  Service: Open Heart Surgery;  Laterality: N/A;   UMBILICAL HERNIA REPAIR N/A 05/18/2014   Procedure: HERNIA REPAIR UMBILICAL ADULT;  Surgeon: Axel Filler, MD;  Location: [facility] OR;  Service: General;  Laterality: N/A;   UMBILICAL HERNIA REPAIR N/A 04/28/2015   Procedure: UMBILICAL HERNIA REPAIR WITH MESH;  Surgeon: Abigail Miyamoto, MD;  Location: [facility] OR;  Service: General;  Laterality: N/A;    Family History  Problem Relation Age of Onset   Colon polyps Father    Stroke Father    Dementia Father        vascular   Colon cancer Neg Hx    Stomach cancer Neg Hx     Social History:  reports that he quit smoking about 10 years ago. His smoking use included cigars. He has never used smokeless tobacco. He reports that he does not currently use alcohol. He reports that he does not use drugs.  Review of Systems:  Hypertension:    His blood pressure has been managed by his nephrologist and transplant team monitors BP at home periodically  BP Readings from Last 3 Encounters:  05/02/23 136/84  04/17/23 124/76  03/01/23 122/60   Lab Results  Component Value Date   CREATININE 2.60 (H)  04/29/2023   CREATININE 2.51 (H) 01/29/2023   CREATININE 1.90 (H) 10/15/2022    Lipids:  His LDL is below 70 with 20 mg Crestor Has no evidence of vascular disease     Lab Results  Component Value Date   CHOL 116 05/02/2022   CHOL 109 10/13/2021   CHOL 148 07/04/2021   Lab Results  Component Value Date   HDL 52 05/02/2022   HDL 31 (L) 10/13/2021   HDL 69.70 07/04/2021   Lab Results  Component Value Date   LDLCALC 55 05/02/2022   LDLCALC 65 10/13/2021   LDLCALC 67 07/04/2021   Lab Results  Component Value Date   TRIG 45 05/02/2022   TRIG 66 10/13/2021   TRIG 54.0 07/04/2021   Lab Results  Component Value Date   CHOLHDL 2.2 05/02/2022   CHOLHDL 3.5 10/13/2021   CHOLHDL 2 07/04/2021   No results found for: "LDLDIRECT"   HYPOTHYROIDISM: He has had long-standing hypothyroidism  Has been taking his 100 g levothyroxine every morning He does have persistent fatigue for other reasons  He has taken his levothyroxine regularly  Lab Results  Component Value Date   TSH 1.16 01/29/2023       HYPOGONADISM: 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  When testosterone level was low he was having fatigue and decreased motivation and because of his  he was prescribed He has been using AndroGel since February 2019 but subsequently stopped this because of cost  Current regimen is Depo testosterone 120 mg weekly since 11/2021 He does feel better when he takes the shots regularly but still has fatigue related to depression  He has taken his injections fairly regularly now Testosterone level is improved further at 622  Pretreatment free testosterone level low at 3.7   Lab Results  Component Value Date   TESTOSTERONE 621.73 04/29/2023   TESTOSTERONE 367.79 01/29/2023   TESTOSTERONE 270.43 (L) 10/29/2022   Lab Results  Component Value Date   HGB 12.9 (L) 10/15/2022    He has depression, taking 30 mg of Cymbalta; followed by  PCP  Diabetic neuropathy: Has mild sensory loss on his last exam Followed by podiatrist regularly    Examination:   BP 136/84  Pulse 70   Ht 5\' 11"  (1.803 m)   Wt 290 lb 9.6 oz (131.8 kg)   SpO2 96%   BMI 40.53 kg/m   Body mass index is 40.53 kg/m.    ASSESSMENT/ PLAN:    Diabetes type 2 with obesity, insulin-requiring:   See history of present illness for detailed discussion of his current management, problems identified and glycemic patterns on his freestyle libre sensor download  Has been on the OmniPod pump with U-500   A1c is 7.6  Recent GMI higher at 7.9  His blood sugars are poorly controlled and he has adjusted his basal rates with much lower basal rates between 4 PM and 6 AM causing blood sugars to be the highest overnight again Also blood sugars are highly variable but generally higher throughout the day With his increasing basal rate at 6 AM is getting low sugars late morning also He is using the pump to do some correction boluses but no mealtime boluses He has irregular meals and snacks and these are also causing prolonged hyperglycemia including late at night  He was recommended a GLP-1 drug at [facility] although he had anorexia with Victoza previously and this was stopped   Recommendations: Start bolusing consistently for carbohydrate He can change the carbohydrate ratio to 1: 15 Sensitivity will be 50 with target 130 Also needs to take boluses 30 minutes before every meal; if preferring to use NovoLog he can also use injections for mealtime coverage with quicker onset of action Basal rates will be changed as below Will look into getting the OmniPod 5 when the freestyle Josephine Igo is approved for integration Consistent diet and avoid high carbohydrate and high fat meals New basal rate profile: 12 AM = 0.4, 6 AM = 1.8, 11 AM = 1.4 and 4 PM = 0.5  Trial of OZEMPIC He will use a sample of 0.25 mg weekly  Explained to the patient what GLP-1 drugs are, the sites of  actions and the body, reduction of hunger sensation and improved insulin secretion.  Discussed the benefit of weight loss with these medications. Explained possible side effects of OZEMPIC, most commonly nausea that usually improves over time.  Also explained safety information associated with the medication Demonstrated the medication injection device and injection technique to the patient.  To start the injections with the 0.25 mg dosage weekly for the first 4 injections and then go up to 0.5 mg weekly if no continued nausea  Patient brochure on Ozempic and dosing guide given If he has no nausea or anorexia he can call for prescription subsequently  HYPOTHYROIDISM: Check TSH on the next visit  Hypogonadism: He will continue 120 mg of testosterone weekly but again follow-up testosterone level and reduce the dose is rising further, currently no erythrocytosis with this    Patient Instructions  Bolus for all  meals     Reather Littler 05/02/2023, 4:47 PM    Note: This office note was prepared with Dragon voice recognition system technology. Any transcriptional errors that result from this process are unintentional.

## 2023-05-02 NOTE — Telephone Encounter (Signed)
Sample  Medication: Ozempic  Dosage: 0.25/0.5 ZOX:[id number] Exp:08/07/24 Quantity: 1 box

## 2023-05-07 ENCOUNTER — Encounter: Payer: Self-pay | Admitting: Endocrinology

## 2023-05-20 ENCOUNTER — Other Ambulatory Visit: Payer: Self-pay

## 2023-05-20 ENCOUNTER — Encounter: Payer: Self-pay | Admitting: Cardiovascular Disease

## 2023-05-20 MED ORDER — DOXAZOSIN MESYLATE 2 MG PO TABS: 1.0000 mg | ORAL_TABLET | Freq: Every day | ORAL | 0 refills | Status: DC

## 2023-05-20 NOTE — Telephone Encounter (Signed)
Medication sent to pts pharmacy 

## 2023-05-21 ENCOUNTER — Ambulatory Visit (HOSPITAL_COMMUNITY): Payer: 59 | Attending: General Practice

## 2023-05-21 DIAGNOSIS — I480 Paroxysmal atrial fibrillation: Secondary | ICD-10-CM | POA: Diagnosis present

## 2023-05-21 DIAGNOSIS — Z952 Presence of prosthetic heart valve: Secondary | ICD-10-CM | POA: Diagnosis not present

## 2023-05-21 LAB — ECHOCARDIOGRAM COMPLETE
AR max vel: 2.19 cm2
AV Area VTI: 2.39 cm2
AV Area mean vel: 2.18 cm2
AV Mean grad: 11 mmHg
AV Peak grad: 19.9 mmHg
Ao pk vel: 2.23 m/s
MV VTI: 1.95 cm2
S' Lateral: 2.2 cm

## 2023-05-28 NOTE — Progress Notes (Deleted)
NEUROLOGY FOLLOW UP OFFICE NOTE  Robert [name] [id number]  Assessment/Plan:   Chronic daily headaches Mild vascular neurocognitive disorder Bilateral vestibular hypofunction History of post-operative left greater than right bilateral embolic infarcts s/p CABG and AVR Lumbar radiculopathy, right-sided Hypertension Type 2 diabetes mellitus ESRD s/p renal transplant, immunocompromised    He reports that his short term disability runs out on 11/17.  I don't think he will be able to return to work and should consider long-term disability.   Zonisamide 75mg  at bedtime  Secondary stroke prevention as managed by cardiology and PCP: Eliquis Statin.  LDL goal less than 70 Normotensive blood pressure Hgb A1c goal less than 7 Follow up in 5 months.    Subjective:  Robert [name] is a 62 year old left-handed male with CAD, DM II, aortic valve stenosis, ESRD s/p renal transplant with CKD3b, hypothyroidism, HTN, sleep apnea and paroxysmal atrial tachycardia and PSVT who follows up for stroke.   UPDATE: Current medications:  Zonisamide 75mg  daily, Eliquis, rosuvastatin, duloxetine 30mg  daily, gabapentin 200mg  AM and 300mg  PM, amlodipine, carvedilol, doxazosin, clonidine, furosemide, HCTZ, hydralazine, duloxetine, prednisone, Cellcept, tramadol   ***   HISTORY: Following CABG x1 and aortic valve replacement  on 10/05/2021, he noted that he was unable to lift his right leg.  He has history of right lower extremity weakness and pain following renal transplant in 2018 but his leg was now almost plegic.  CT head negative for acute abnormality.  MRI of brain on 10/11/2021 revealed scattered acute and subacute small infarcts in multiple vascular territories including left cerebellar hemisphere, left MCA territory and bilateral PCA territory.  MRA of head and neck demonstrated chronic occluded V4 segment of the right vertebral artery as well as moderate stenosis of the right ACA A2.  The  intra-operative TEE on 12/29 revealed no evidence of thrombus.  He did develop post-operative valvular a fib and was started on warfarin.  To evaluate for possible radiculopathy, he also had MRI of lumbar spine which revealed multilevel degenerative disc and endplate disease with mild spinal and left lateral recess stenosis at L1-2, moderate right neural foraminal stenosis at L3 and bilateral L4 nerve levels and moderate to severe neural foraminal stenosis at the bilateral L5 nerve levels.  He was discharged to SNF.  He was readmitted to the hospital on 10/21/2021 for hypertensive urgency presenting as dizziness and nausea.  Blood pressure was 204/119.  CT head again demonstrated known subacute embolic infarcts but no new acute findings.  He had to be hospitalized again on 11/08/2021 for   pyelonephritis and UTI.    Since the stroke, he had been experiencing headaches and dizziness.  Headaches are severe throbbing pain from left eye radiating to both temples.  Sometimes nausea but no photophobia, phonophobia or visual disturbance.  Occurs every other day.  Treats with tramadol and Tylenol.  Lasts 30 minutes with treatment, otherwise 1.5 hours.  Takes tramadol and Tylenol daily.  Dizziness is persistent but will have episodes of increased severity lasting 15 minutes every 2 days.  He underwent vestibular evaluation by physical therapy.  Oculomotor exam revealed saccades with superior smooth pursuits, undershooting with saccadic testing, slow and guarded VOR, positive HIT right greater than left, and negative positional testing.    Since the stroke, he has had episode of vertical double vision for several seconds.  He had worsening symptoms on 11/24/2021 while working with OT/PT.  Still has right leg weakness.  Arm is improved.  He has trouble  ambulating.  He was seen in the ED where etiology was thought to be due to hypoglycemia.  CT head and follow up MRI of brain showed no acute findings.  Since the stroke, he has  trouble remembering events that had occurred around that time.  Underwent neuropsychological evaluation on 02/08/2022 consistent with primarily mild neurocognitive disorder in conjunction with underlying anxiety, chronic pain (neuropathy) and sleep dysfunction.  When he gets stressed out, he continues to sometimes experience develop double vision.  On 10/15/2022, he developed dizziness, skewed diplopia and right sided numbness.  He was seen in ED where MRI of brain and MRA head and neck personally reviewed revealed no changes.  He was found to have a UTI and treated accordingly.  His blood sugar was low, in the 40s.  Sometimes he still drags his right leg.    Admitted to hospital on 04/30/2022 for UTI when he developed sudden onset of subjective bilateral facial numbness and right arm numbness and weakness.  Ongoing chronic dizziness and headache which is not new.  Other than known right leg weakness, no new objective focal deficits noted.  Stroke code initiated.  CT and subsequent MRI of head showed no acute stroke.  MRA head and neck revealed no LVO or hemodynamically significant stenosis.  Reported 8 mm left ICA terminus outpouching likely artifact due to motion.  2D echo showed EF 60-65%.  LDL 65.  Hgb A1c 7.4.  No change in secondary stroke prevention made in regards to antithrombotic therapy and statin- -advised to continue Eliquis and Crestor.  To follow up on possible cerebral aneurysm, he had a repeat MRA of head on 06/01/2022 which again demonstrated chronic occlusion of the right vertebral artery beyond the PICA origin but no aneurysm.    Past medications: nortriptyline (side effects)  PAST MEDICAL HISTORY: Past Medical History:  Diagnosis Date   Acute cystitis without hematuria 11/09/2021   AKI (acute kidney injury) 08/08/2017   Elevated sCr to 2.5 (baseline ~1.7) noted at OSH - Subsequent transfer to [facility]  - Currently on IVMF in the setting of diarrhea and AKI - sCr at baseline at time of discharge    Anemia in chronic kidney disease 03/30/2015   Anticoagulated on Coumadin 11/09/2021   Aortic valve stenosis    Arthritis    Atrial fibrillation 11/29/2021   Benign essential hypertension 07/30/2014   prior to transplant pt took Metorpolol tartrate 50mg  qd on off HD days   Benign neoplasm of transverse colon    Cancer    skin ca right shoulder, plastic dsyplasia, pre-Ca polpys removed on Colonoscopy- 07/2014   Cerebrovascular accident (CVA) due to embolism of precerebral artery    Chronic renal failure    Colon polyps 07/23/2014   Tubular adenomas x 5   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   Diverticulosis of colon without hemorrhage    Eczema    ESRD (end stage renal disease)    hemodialysis 06/2014-02/2017, s/p living unrelated kidney transplant 02/26/17   Exertional dyspnea    Fatigue 01/08/2014   Fissure of skin 03/08/2020   GI bleed 07/26/2017   Headache    Heart murmur    aortic stenosis (moderate-severe 04/2021)   History of COVID-19 2023   History of unilateral nephrectomy 09/06/2013  Hydrocele, acquired 04/07/2017   Hyperlipidemia 09/06/2013   Hypertensive urgency 10/21/2021   Hyponatremia 11/09/2021   Hypothyroidism    Immunosuppressed status 02/26/2017   Induction agent: Solumedrol - Envarsus 3mg  daily  - Home dose decreased from 5mg  to 3mg  in the setting of FK 15.7 while inpatient   - Previously on Prograf, though d/c'ed during previous admission; suspected this may be causing hallucinations  - Cellcept 1000mg  Q12H  - Prednisone 5mg  daily   Inguinal hernia without obstruction or gangrene 06/30/2017   Right inguinal wall defect appreciated by CT imaging [place] and on exam Dr. Roby Lofts today Dr. Roby Lofts has discussed hernia repair with mesh  at some point in near future  Last Assessment & Plan:  Formatting of this note might be different from the original. Right inguinal wall defect appreciated by CT imaging [place] and on exam Dr. Roby Lofts today Dr. Roby Lofts has discussed hern   Kidney transplanted 02/26/2017   Date of Transplant = 02/26/17  ABO (Donor/Recipient) = compatible Blood type: O+/A+ DonorType: Living donor, unrelated Allograft type:Left kidney Donor anatomy: 2 arteries, 1 veins and single ureter  Pants-to-skirt anastomosis of dual renal arteries on the backtable. Donor Kidney Bx: Not applicable Allograft injury/complications: None Ne   Lower extremity edema 09/06/2013   Macular degeneration    Mild vascular neurocognitive disorder 02/08/2022   OAB (overactive bladder) 12/28/2019   Obstructive sleep apnea 09/06/2013   On Bi-PAP  Last Assessment & Plan:  Formatting of this note might be different from the original. - History of OSA, continue CPAP at night   Peritoneal dialysis catheter in place 02/24/2015   RLQ abdominal pain 04/19/2020   S/P AVR (aortic valve replacement) 10/05/2021   SBO (small bowel obstruction) 05/2015   Scrotal edema 03/14/2017   Testicular ultrasound did show hydrocele  Will refer to urology regarding ongoign swelling, hydrocele.   Last Assessment & Plan:  Formatting of this note might be different from the original.   Testicular ultrasound did show hydrocele  Will refer to urology regarding ongoign swelling, hydrocele.   Shortness of breath    with exertion   Stage 3b chronic kidney disease (CKD) 03/22/2014   SVT (supraventricular tachycardia) 09/06/2013   Tremor of both hands 06/29/2017   Last Assessment & Plan:  Present since transplant, stable and unchanged Query from tacrolimus -Improved tremors noted and verbalized by pt - Prograf doses titrated per FK levels  Last Assessment & Plan:  Formatting of this note might be different from the original. Present since transplant, stable and unchanged Query  from tacrolimus -Improved tremors noted and verbalized by pt - Prograf doses titr   Type 2 diabetes mellitus with hyperglycemia, with long-term current use of insulin 07/14/2015   Umbilical hernia    s/p repair 04/28/15    MEDICATIONS: Current Outpatient Medications on File Prior to Visit  Medication Sig Dispense Refill   acetaminophen (TYLENOL) 325 MG tablet Take 2 tablets (650 mg total) by mouth every 4 (four) hours as needed for moderate pain.     albuterol (VENTOLIN HFA) 108 (90 Base) MCG/ACT inhaler Inhale 1 puff into the lungs. Every hour as needed     amLODipine (NORVASC) 5 MG tablet TAKE 1.5 TABLETS BY MOUTH DAILY. 135 tablet 2   apixaban (ELIQUIS) 5 MG TABS tablet Take 1 tablet (5 mg total) by mouth 2 (two) times daily. 180 tablet 3   B-D UF III MINI PEN NEEDLES 31G X 5 MM MISC USE TO INJECT INSULIN 4 TIMES  A DAY 360 each 2   belatacept (NULOJIX) 250 MG SOLR injection Inject 24.5 mLs into the vein every 28 (twenty-eight) days.      carvedilol (COREG) 25 MG tablet Take 1 tablet (25 mg total) by mouth 2 (two) times daily with a meal.     chlorthalidone (HYGROTON) 25 MG tablet Take 1 tablet by mouth daily.     Continuous Glucose Sensor (FREESTYLE LIBRE 3 SENSOR) MISC SMARTSIG:1 TOPICAL EVERY 2 WEEKS 6 each 2   doxazosin (CARDURA) 2 MG tablet Take 0.5 tablets (1 mg total) by mouth daily. Take 0.5 Tablet Daily 90 tablet 0   DULoxetine (CYMBALTA) 30 MG capsule Take 30 mg by mouth daily.     gabapentin (NEURONTIN) 100 MG capsule Take 200-300 mg by mouth See admin instructions. Taking 200 mg in the AM and 300 mg in the evening     hydrALAZINE (APRESOLINE) 100 MG tablet TAKE 1 TABLET BY MOUTH EVERY 8 HOURS 270 tablet 0   insulin aspart (NOVOLOG) 100 UNIT/ML injection Inject 5-20 Units into the skin daily as needed for high blood sugar.     insulin regular human CONCENTRATED (HUMULIN R) 500 UNIT/ML injection Inject 125 Units into the skin every 3 (three) days. Omnipod     levothyroxine  (SYNTHROID) 112 MCG tablet TAKE 1 TABLET BY MOUTH EVERY DAY 90 tablet 3   mupirocin ointment (BACTROBAN) 2 % 1 application Externally Twice a day for 7 days     mycophenolate (CELLCEPT) 250 MG capsule Take 500 mg by mouth every 12 (twelve) hours.     NEEDLE, DISP, 18 G (BD HYPODERMIC NEEDLE) 18G X 1" MISC USE AS DIRECTED TO INJECT 0.6MLS OF TESTOSTERONE EVERY 7 DAYS (DRAW UP) 100 each 1   nitroGLYCERIN (NITROSTAT) 0.4 MG SL tablet Place 0.4 mg under the tongue every 5 (five) minutes as needed for chest pain.     NON FORMULARY CPAP/BIPAP  at bedtime  BIPAP per pt     predniSONE (DELTASONE) 5 MG tablet Take 5 mg by mouth daily with breakfast. Continuously     rosuvastatin (CRESTOR) 20 MG tablet TAKE 1 TABLET BY MOUTH EVERY DAY 90 tablet 0   senna-docusate (SENOKOT-S) 8.6-50 MG tablet Take 1 tablet by mouth at bedtime as needed for mild constipation. (Patient taking differently: Take 1 tablet by mouth every other day.)     Syringe, Disposable, 1 ML MISC Use to draw up 0.63ml of testosterone medication 25 each 0   SYRINGE-NEEDLE, DISP, 3 ML (BD PLASTIPAK SYRINGE) 21G X 1" 3 ML MISC USE AS DIRECTED TO INJECT 0.6MLS OF TESTOSTERONE EVERY 7 DAYS (INJECT) 100 each 1   tamsulosin (FLOMAX) 0.4 MG CAPS capsule Take 0.8 mg by mouth daily after supper.     Testosterone Cypionate 200 MG/ML SOLN Inject 120 mg as directed once a week. Saturday     traMADol (ULTRAM) 50 MG tablet 1 tablet Oral Once a day for 90 days     traZODone (DESYREL) 50 MG tablet 1 tablet at bedtime as needed Orally Once a day (Do not take if take ambien)     zolpidem (AMBIEN) 10 MG tablet Take 10 mg by mouth at bedtime as needed for sleep.     zonisamide (ZONEGRAN) 25 MG capsule TAKE 3 CAPSULES (75 MG TOTAL) BY MOUTH DAILY 90 capsule 3   No current facility-administered medications on file prior to visit.    ALLERGIES: No Known Allergies  FAMILY HISTORY: Family History  Problem Relation Age of Onset  Colon polyps Father    Stroke  Father    Dementia Father        vascular   Colon cancer Neg Hx    Stomach cancer Neg Hx       Objective:  *** General: No acute distress.  Patient appears well-groomed.   Head:  Normocephalic/atraumatic Eyes:  Fundi examined but not visualized Neck: supple, no paraspinal tenderness, full range of motion Heart:  Regular rate and rhythm Neurological Exam: alert and oriented.  Speech fluent and not dysarthric, language intact.  CN II-XII intact. Bulk and tone normal, muscle strength 5-/5 right upper extremity and right hip flexion, otherwise 5/5 throughout.  Sensation to light touch intact.  Deep tendon reflexes 2+ throughout.  Finger to nose testing intact.  Cautious wide-based gait.  Ambulates with cane.  Romberg positive.   Shon Millet, DO  CC: Guerry Bruin, MD

## 2023-05-29 ENCOUNTER — Encounter: Payer: Self-pay | Admitting: Neurology

## 2023-05-29 ENCOUNTER — Ambulatory Visit: Payer: 59 | Admitting: Neurology

## 2023-06-07 ENCOUNTER — Other Ambulatory Visit: Payer: Self-pay | Admitting: Endocrinology

## 2023-06-07 DIAGNOSIS — E782 Mixed hyperlipidemia: Secondary | ICD-10-CM

## 2023-06-11 ENCOUNTER — Other Ambulatory Visit: Payer: Self-pay | Admitting: Cardiovascular Disease

## 2023-06-11 NOTE — Progress Notes (Deleted)
 NEUROLOGY FOLLOW UP OFFICE NOTE  ISRRAEL REMBERT [id number]  Assessment/Plan:   Chronic daily headaches Mild vascular neurocognitive disorder Bilateral vestibular hypofunction History of post-operative left greater than right bilateral embolic infarcts s/p CABG and AVR Lumbar radiculopathy, right-sided Hypertension Type 2 diabetes mellitus ESRD s/p renal transplant, immunocompromised    He reports that his short term disability runs out on 11/17.  I don't think he will be able to return to work and should consider long-term disability.   Zonisamide 75mg  at bedtime  Secondary stroke prevention as managed by cardiology and PCP: Eliquis Statin.  LDL goal less than 70 Normotensive blood pressure Hgb A1c goal less than 7 Follow up in 5 months.    Subjective:  [name] is a 62 year old left-handed male with CAD, DM II, aortic valve stenosis, ESRD s/p renal transplant with CKD3b, hypothyroidism, HTN, sleep apnea and paroxysmal atrial tachycardia and PSVT who follows up for stroke.   UPDATE: Current medications:  Zonisamide 75mg  daily, Eliquis, rosuvastatin, duloxetine 30mg  daily, gabapentin 200mg  AM and 300mg  PM, amlodipine, carvedilol, doxazosin, clonidine, furosemide, HCTZ, hydralazine, duloxetine, prednisone, Cellcept, tramadol   ***   HISTORY: Following CABG x1 and aortic valve replacement  on 10/05/2021, he noted that he was unable to lift his right leg.  He has history of right lower extremity weakness and pain following renal transplant in 2018 but his leg was now almost plegic.  CT head negative for acute abnormality.  MRI of brain on 10/11/2021 revealed scattered acute and subacute small infarcts in multiple vascular territories including left cerebellar hemisphere, left MCA territory and bilateral PCA territory.  MRA of head and neck demonstrated chronic occluded V4 segment of the right vertebral artery as well as moderate stenosis of the right ACA A2.  The  intra-operative TEE on 12/29 revealed no evidence of thrombus.  He did develop post-operative valvular a fib and was started on warfarin.  To evaluate for possible radiculopathy, he also had MRI of lumbar spine which revealed multilevel degenerative disc and endplate disease with mild spinal and left lateral recess stenosis at L1-2, moderate right neural foraminal stenosis at L3 and bilateral L4 nerve levels and moderate to severe neural foraminal stenosis at the bilateral L5 nerve levels.  He was discharged to SNF.  He was readmitted to the hospital on 10/21/2021 for hypertensive urgency presenting as dizziness and nausea.  Blood pressure was 204/119.  CT head again demonstrated known subacute embolic infarcts but no new acute findings.  He had to be hospitalized again on 11/08/2021 for   pyelonephritis and UTI.    Since the stroke, he had been experiencing headaches and dizziness.  Headaches are severe throbbing pain from left eye radiating to both temples.  Sometimes nausea but no photophobia, phonophobia or visual disturbance.  Occurs every other day.  Treats with tramadol and Tylenol.  Lasts 30 minutes with treatment, otherwise 1.5 hours.  Takes tramadol and Tylenol daily.  Dizziness is persistent but will have episodes of increased severity lasting 15 minutes every 2 days.  He underwent vestibular evaluation by physical therapy.  Oculomotor exam revealed saccades with superior smooth pursuits, undershooting with saccadic testing, slow and guarded VOR, positive HIT right greater than left, and negative positional testing.    Since the stroke, he has had episode of vertical double vision for several seconds.  He had worsening symptoms on 11/24/2021 while working with OT/PT.  Still has right leg weakness.  Arm is improved.  He has trouble  ambulating.  He was seen in the ED where etiology was thought to be due to hypoglycemia.  CT head and follow up MRI of brain showed no acute findings.  Since the stroke, he has  trouble remembering events that had occurred around that time.  Underwent neuropsychological evaluation on 02/08/2022 consistent with primarily mild neurocognitive disorder in conjunction with underlying anxiety, chronic pain (neuropathy) and sleep dysfunction.  When he gets stressed out, he continues to sometimes experience develop double vision.  On 10/15/2022, he developed dizziness, skewed diplopia and right sided numbness.  He was seen in ED where MRI of brain and MRA head and neck personally reviewed revealed no changes.  He was found to have a UTI and treated accordingly.  His blood sugar was low, in the 40s.  Sometimes he still drags his right leg.    Admitted to hospital on 04/30/2022 for UTI when he developed sudden onset of subjective bilateral facial numbness and right arm numbness and weakness.  Ongoing chronic dizziness and headache which is not new.  Other than known right leg weakness, no new objective focal deficits noted.  Stroke code initiated.  CT and subsequent MRI of head showed no acute stroke.  MRA head and neck revealed no LVO or hemodynamically significant stenosis.  Reported 8 mm left ICA terminus outpouching likely artifact due to motion.  2D echo showed EF 60-65%.  LDL 65.  Hgb A1c 7.4.  No change in secondary stroke prevention made in regards to antithrombotic therapy and statin- -advised to continue Eliquis and Crestor.  To follow up on possible cerebral aneurysm, he had a repeat MRA of head on 06/01/2022 which again demonstrated chronic occlusion of the right vertebral artery beyond the PICA origin but no aneurysm.    Past medications: nortriptyline (side effects)  PAST MEDICAL HISTORY: Past Medical History:  Diagnosis Date   Acute cystitis without hematuria 11/09/2021   AKI (acute kidney injury) 08/08/2017   Elevated sCr to 2.5 (baseline ~1.7) noted at OSH - Subsequent transfer to [facility]  - Currently on IVMF in the setting of diarrhea and AKI - sCr at baseline at time of discharge    Anemia in chronic kidney disease 03/30/2015   Anticoagulated on Coumadin 11/09/2021   Aortic valve stenosis    Arthritis    Atrial fibrillation 11/29/2021   Benign essential hypertension 07/30/2014   prior to transplant pt took Metorpolol tartrate 50mg  qd on off HD days   Benign neoplasm of transverse colon    Cancer    skin ca right shoulder, plastic dsyplasia, pre-Ca polpys removed on Colonoscopy- 07/2014   Cerebrovascular accident (CVA) due to embolism of precerebral artery    Chronic renal failure    Colon polyps 07/23/2014   Tubular adenomas x 5   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   Diverticulosis of colon without hemorrhage    Eczema    ESRD (end stage renal disease)    hemodialysis 06/2014-02/2017, s/p living unrelated kidney transplant 02/26/17   Exertional dyspnea    Fatigue 01/08/2014   Fissure of skin 03/08/2020   GI bleed 07/26/2017   Headache    Heart murmur    aortic stenosis (moderate-severe 04/2021)   History of COVID-19 2023   History of unilateral nephrectomy 09/06/2013  Hydrocele, acquired 04/07/2017   Hyperlipidemia 09/06/2013   Hypertensive urgency 10/21/2021   Hyponatremia 11/09/2021   Hypothyroidism    Immunosuppressed status 02/26/2017   Induction agent: Solumedrol - Envarsus 3mg  daily  - Home dose decreased from 5mg  to 3mg  in the setting of FK 15.7 while inpatient   - Previously on Prograf, though d/c'ed during previous admission; suspected this may be causing hallucinations  - Cellcept 1000mg  Q12H  - Prednisone 5mg  daily   Inguinal hernia without obstruction or gangrene 06/30/2017   Right inguinal wall defect appreciated by CT imaging [place] and on exam Dr. Roby Lofts today Dr. Roby Lofts has discussed hernia repair with mesh  at some point in near future  Last Assessment & Plan:  Formatting of this note might be different from the original. Right inguinal wall defect appreciated by CT imaging [place] and on exam Dr. Roby Lofts today Dr. Roby Lofts has discussed hern   Kidney transplanted 02/26/2017   Date of Transplant = 02/26/17  ABO (Donor/Recipient) = compatible Blood type: O+/A+ DonorType: Living donor, unrelated Allograft type:Left kidney Donor anatomy: 2 arteries, 1 veins and single ureter  Pants-to-skirt anastomosis of dual renal arteries on the backtable. Donor Kidney Bx: Not applicable Allograft injury/complications: None Ne   Lower extremity edema 09/06/2013   Macular degeneration    Mild vascular neurocognitive disorder 02/08/2022   OAB (overactive bladder) 12/28/2019   Obstructive sleep apnea 09/06/2013   On Bi-PAP  Last Assessment & Plan:  Formatting of this note might be different from the original. - History of OSA, continue CPAP at night   Peritoneal dialysis catheter in place 02/24/2015   RLQ abdominal pain 04/19/2020   S/P AVR (aortic valve replacement) 10/05/2021   SBO (small bowel obstruction) 05/2015   Scrotal edema 03/14/2017   Testicular ultrasound did show hydrocele  Will refer to urology regarding ongoign swelling, hydrocele.   Last Assessment & Plan:  Formatting of this note might be different from the original.   Testicular ultrasound did show hydrocele  Will refer to urology regarding ongoign swelling, hydrocele.   Shortness of breath    with exertion   Stage 3b chronic kidney disease (CKD) 03/22/2014   SVT (supraventricular tachycardia) 09/06/2013   Tremor of both hands 06/29/2017   Last Assessment & Plan:  Present since transplant, stable and unchanged Query from tacrolimus -Improved tremors noted and verbalized by pt - Prograf doses titrated per FK levels  Last Assessment & Plan:  Formatting of this note might be different from the original. Present since transplant, stable and unchanged Query  from tacrolimus -Improved tremors noted and verbalized by pt - Prograf doses titr   Type 2 diabetes mellitus with hyperglycemia, with long-term current use of insulin 07/14/2015   Umbilical hernia    s/p repair 04/28/15    MEDICATIONS: Current Outpatient Medications on File Prior to Visit  Medication Sig Dispense Refill   acetaminophen (TYLENOL) 325 MG tablet Take 2 tablets (650 mg total) by mouth every 4 (four) hours as needed for moderate pain.     albuterol (VENTOLIN HFA) 108 (90 Base) MCG/ACT inhaler Inhale 1 puff into the lungs. Every hour as needed     amLODipine (NORVASC) 5 MG tablet TAKE 1.5 TABLETS BY MOUTH DAILY. 135 tablet 2   apixaban (ELIQUIS) 5 MG TABS tablet Take 1 tablet (5 mg total) by mouth 2 (two) times daily. 180 tablet 3   B-D UF III MINI PEN NEEDLES 31G X 5 MM MISC USE TO INJECT INSULIN 4 TIMES  A DAY 360 each 2   belatacept (NULOJIX) 250 MG SOLR injection Inject 24.5 mLs into the vein every 28 (twenty-eight) days.      carvedilol (COREG) 25 MG tablet Take 1 tablet (25 mg total) by mouth 2 (two) times daily with a meal.     chlorthalidone (HYGROTON) 25 MG tablet Take 1 tablet by mouth daily.     Continuous Glucose Sensor (FREESTYLE LIBRE 3 SENSOR) MISC SMARTSIG:1 TOPICAL EVERY 2 WEEKS 6 each 2   doxazosin (CARDURA) 2 MG tablet Take 0.5 tablets (1 mg total) by mouth daily. Take 0.5 Tablet Daily 90 tablet 0   DULoxetine (CYMBALTA) 30 MG capsule Take 30 mg by mouth daily.     gabapentin (NEURONTIN) 100 MG capsule Take 200-300 mg by mouth See admin instructions. Taking 200 mg in the AM and 300 mg in the evening     hydrALAZINE (APRESOLINE) 100 MG tablet TAKE 1 TABLET BY MOUTH EVERY 8 HOURS 270 tablet 0   insulin aspart (NOVOLOG) 100 UNIT/ML injection Inject 5-20 Units into the skin daily as needed for high blood sugar.     insulin regular human CONCENTRATED (HUMULIN R) 500 UNIT/ML injection Inject 125 Units into the skin every 3 (three) days. Omnipod     levothyroxine  (SYNTHROID) 112 MCG tablet TAKE 1 TABLET BY MOUTH EVERY DAY 90 tablet 3   mupirocin ointment (BACTROBAN) 2 % 1 application Externally Twice a day for 7 days     mycophenolate (CELLCEPT) 250 MG capsule Take 500 mg by mouth every 12 (twelve) hours.     NEEDLE, DISP, 18 G (BD HYPODERMIC NEEDLE) 18G X 1" MISC USE AS DIRECTED TO INJECT 0.6MLS OF TESTOSTERONE EVERY 7 DAYS (DRAW UP) 100 each 1   nitroGLYCERIN (NITROSTAT) 0.4 MG SL tablet Place 0.4 mg under the tongue every 5 (five) minutes as needed for chest pain.     NON FORMULARY CPAP/BIPAP  at bedtime  BIPAP per pt     predniSONE (DELTASONE) 5 MG tablet Take 5 mg by mouth daily with breakfast. Continuously     rosuvastatin (CRESTOR) 20 MG tablet TAKE 1 TABLET BY MOUTH EVERY DAY 90 tablet 0   senna-docusate (SENOKOT-S) 8.6-50 MG tablet Take 1 tablet by mouth at bedtime as needed for mild constipation. (Patient taking differently: Take 1 tablet by mouth every other day.)     Syringe, Disposable, 1 ML MISC Use to draw up 0.63ml of testosterone medication 25 each 0   SYRINGE-NEEDLE, DISP, 3 ML (BD PLASTIPAK SYRINGE) 21G X 1" 3 ML MISC USE AS DIRECTED TO INJECT 0.6MLS OF TESTOSTERONE EVERY 7 DAYS (INJECT) 100 each 1   tamsulosin (FLOMAX) 0.4 MG CAPS capsule Take 0.8 mg by mouth daily after supper.     Testosterone Cypionate 200 MG/ML SOLN Inject 120 mg as directed once a week. Saturday     traMADol (ULTRAM) 50 MG tablet 1 tablet Oral Once a day for 90 days     traZODone (DESYREL) 50 MG tablet 1 tablet at bedtime as needed Orally Once a day (Do not take if take ambien)     zolpidem (AMBIEN) 10 MG tablet Take 10 mg by mouth at bedtime as needed for sleep.     zonisamide (ZONEGRAN) 25 MG capsule TAKE 3 CAPSULES (75 MG TOTAL) BY MOUTH DAILY 90 capsule 3   No current facility-administered medications on file prior to visit.    ALLERGIES: No Known Allergies  FAMILY HISTORY: Family History  Problem Relation Age of Onset  Colon polyps Father    Stroke  Father    Dementia Father        vascular   Colon cancer Neg Hx    Stomach cancer Neg Hx       Objective:  *** General: No acute distress.  Patient appears well-groomed.   Head:  Normocephalic/atraumatic Eyes:  Fundi examined but not visualized Neck: supple, no paraspinal tenderness, full range of motion Heart:  Regular rate and rhythm Neurological Exam: alert and oriented.  Speech fluent and not dysarthric, language intact.  CN II-XII intact. Bulk and tone normal, muscle strength 5-/5 right upper extremity and right hip flexion, otherwise 5/5 throughout.  Sensation to light touch intact.  Deep tendon reflexes 2+ throughout.  Finger to nose testing intact.  Cautious wide-based gait.  Ambulates with cane.  Romberg positive.   Shon Millet, DO  CC: Guerry Bruin, MD

## 2023-06-12 ENCOUNTER — Other Ambulatory Visit: Payer: Self-pay | Admitting: Neurology

## 2023-06-12 ENCOUNTER — Other Ambulatory Visit: Payer: Self-pay | Admitting: General Practice

## 2023-06-12 ENCOUNTER — Telehealth: Payer: 59 | Admitting: Neurology

## 2023-06-14 NOTE — Progress Notes (Unsigned)
Virtual Visit via Video Note  Consent was obtained for video visit:  Yes.   Answered questions that patient had about telehealth interaction:  Yes.   I discussed the limitations, risks, security and privacy concerns of performing an evaluation and management service by telemedicine. I also discussed with the patient that there may be a patient responsible charge related to this service. The patient expressed understanding and agreed to proceed.  Pt location: Home Physician Location: office Name of referring provider:  [name], Adelfa Koh, MD I connected with Robert [name] at patients initiation/request on 06/17/2023 at  8:30 AM EDT by video enabled telemedicine application and verified that I am speaking with the correct person using two identifiers. 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  I don't think he will be able to return to work and should consider long-term disability.   Zonisamide 75mg  at bedtime  Secondary stroke prevention as managed by cardiology and PCP: Eliquis Statin.  LDL goal less than 70 Normotensive blood pressure Hgb A1c goal less than 7 Follow up in 6-7 months.    Subjective:  Robert [name] is a 62 year old left-handed male with CAD, DM II, aortic valve stenosis, ESRD s/p renal transplant with CKD3b, hypothyroidism, HTN, sleep apnea and paroxysmal atrial tachycardia and PSVT who follows up for headache and stroke.   UPDATE: Current medications:  Zonisamide 75mg  daily, Eliquis, rosuvastatin, duloxetine 30mg  daily, gabapentin  200mg  AM and 300mg  PM, amlodipine, carvedilol, doxazosin, clonidine, furosemide, HCTZ, hydralazine, duloxetine, prednisone, Cellcept, tramadol   No double vision since last time.  No longer with persistent dizziness but has dizzy spells once every 3 days and lasts 15-30 minutes.  Still has headaches.  Takes Tylenol (3 times a week) which usually knocks it out.  Occasionally needs to follow up with a tramadol as second line.  Headaches occur twice a week.  They do not coincide with the dizzy spells.    He has received long-term disability with social security.  Comes with a stipend once a month but not insurance.    HISTORY: Following CABG x1 and aortic valve replacement  on 10/05/2021, he noted that he was unable to lift his right leg.  He has history of right lower extremity weakness and pain following renal transplant in 2018 but his leg was now almost plegic.  CT head negative for acute abnormality.  MRI of brain on 10/11/2021 revealed scattered acute and subacute small infarcts in multiple vascular territories including left cerebellar hemisphere, left MCA territory and bilateral PCA territory.  MRA of head and neck demonstrated chronic occluded V4 segment of the right vertebral artery as well as moderate stenosis of the right ACA A2.  The intra-operative TEE on 12/29 revealed no evidence of thrombus.  He did develop post-operative valvular a fib and was started on warfarin.  To evaluate for possible radiculopathy, he also had MRI of lumbar spine which revealed multilevel degenerative disc and endplate disease with mild spinal and left lateral recess stenosis at L1-2, moderate right neural foraminal stenosis at L3 and bilateral L4 nerve levels and moderate to severe neural foraminal stenosis at  the bilateral L5 nerve levels.  He was discharged to SNF.  He was readmitted to the hospital on 10/21/2021 for hypertensive urgency presenting as dizziness and nausea.  Blood pressure was 204/119.  CT head again  demonstrated known subacute embolic infarcts but no new acute findings.  He had to be hospitalized again on 11/08/2021 for   pyelonephritis and UTI.    Since the stroke, he had been experiencing headaches and dizziness.  Headaches are severe throbbing pain from left eye radiating to both temples.  Sometimes nausea but no photophobia, phonophobia or visual disturbance.  Occurs every other day.  Treats with tramadol and Tylenol.  Lasts 30 minutes with treatment, otherwise 1.5 hours.  Takes tramadol and Tylenol daily.  Dizziness is persistent but will have episodes of increased severity lasting 15 minutes every 2 days.  He underwent vestibular evaluation by physical therapy.  Oculomotor exam revealed saccades with superior smooth pursuits, undershooting with saccadic testing, slow and guarded VOR, positive HIT right greater than left, and negative positional testing.    Since the stroke, he has had episode of vertical double vision for several seconds.  He had worsening symptoms on 11/24/2021 while working with OT/PT.  Still has right leg weakness.  Arm is improved.  He has trouble ambulating.  He was seen in the ED where etiology was thought to be due to hypoglycemia.  CT head and follow up MRI of brain showed no acute findings.  Since the stroke, he has trouble remembering events that had occurred around that time.  Underwent neuropsychological evaluation on 02/08/2022 consistent with primarily mild neurocognitive disorder in conjunction with underlying anxiety, chronic pain (neuropathy) and sleep dysfunction.  When he gets stressed out, he continues to sometimes experience develop double vision.  On 10/15/2022, he developed dizziness, skewed diplopia and right sided numbness.  He was seen in ED where MRI of brain and MRA head and neck personally reviewed revealed no changes.  He was found to have a UTI and treated accordingly.  His blood sugar was low, in the 40s.  Sometimes he still drags his right leg.    Admitted  to hospital on 04/30/2022 for UTI when he developed sudden onset of subjective bilateral facial numbness and right arm numbness and weakness.  Ongoing chronic dizziness and headache which is not new.  Other than known right leg weakness, no new objective focal deficits noted.  Stroke code initiated.  CT and subsequent MRI of head showed no acute stroke.  MRA head and neck revealed no LVO or hemodynamically significant stenosis.  Reported 8 mm left ICA terminus outpouching likely artifact due to motion.  2D echo showed EF 60-65%.  LDL 65.  Hgb A1c 7.4.  No change in secondary stroke prevention made in regards to antithrombotic therapy and statin- -advised to continue Eliquis and Crestor.  To follow up on possible cerebral aneurysm, he had a repeat MRA of head on 06/01/2022 which again demonstrated chronic occlusion of the right vertebral artery beyond the PICA origin but no aneurysm.    Past medications: nortriptyline (side effects)  Past Medical History: Past Medical History:  Diagnosis Date   Acute cystitis without hematuria 11/09/2021   AKI (acute kidney injury) 08/08/2017   Elevated sCr to 2.5 (baseline ~1.7) noted at OSH - Subsequent transfer to [facility]  - Currently on IVMF in the setting of diarrhea and AKI - sCr at baseline at time of discharge   Anemia in chronic kidney disease 03/30/2015   Anticoagulated on Coumadin  11/09/2021   Aortic valve stenosis    Arthritis    Atrial fibrillation 11/29/2021   Benign essential hypertension 07/30/2014   prior to transplant pt took Metorpolol tartrate 50mg  qd on off HD days   Benign neoplasm of transverse colon    Cancer    skin ca right shoulder, plastic dsyplasia, pre-Ca polpys removed on Colonoscopy- 07/2014   Cerebrovascular accident (CVA) due to embolism of precerebral artery    Chronic renal failure    Colon polyps 07/23/2014   Tubular adenomas x 5   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   Diverticulosis of colon without hemorrhage    Eczema    ESRD (end stage renal disease)    hemodialysis 06/2014-02/2017, s/p living unrelated kidney transplant 02/26/17   Exertional dyspnea    Fatigue 01/08/2014   Fissure of skin 03/08/2020   GI bleed 07/26/2017   Headache    Heart murmur    aortic stenosis (moderate-severe 04/2021)   History of COVID-19 2023   History of unilateral nephrectomy 09/06/2013   Hydrocele, acquired 04/07/2017   Hyperlipidemia 09/06/2013   Hypertensive urgency 10/21/2021   Hyponatremia 11/09/2021   Hypothyroidism    Immunosuppressed status 02/26/2017   Induction agent: Solumedrol - Envarsus 3mg  daily  - Home dose decreased from 5mg  to 3mg  in the setting of FK 15.7 while inpatient   - Previously on Prograf, though d/c'ed during previous admission; suspected this may be causing hallucinations  - Cellcept 1000mg  Q12H  - Prednisone 5mg  daily   Inguinal hernia without obstruction or gangrene 06/30/2017   Right inguinal wall defect appreciated by CT imaging [place] and on exam Dr. Roby Lofts today Dr. Roby Lofts has discussed hernia repair with mesh at some point in near future  Last Assessment & Plan:  Formatting of this note might be different from the original. Right inguinal wall defect appreciated by CT imaging [place] and on exam Dr. Roby Lofts today Dr. Roby Lofts has discussed hern   Kidney transplanted 02/26/2017   Date of Transplant = 02/26/17  ABO (Donor/Recipient) = compatible Blood type: O+/A+ DonorType: Living donor, unrelated Allograft type:Left kidney Donor anatomy: 2 arteries, 1 veins and single ureter  Pants-to-skirt anastomosis of dual renal arteries on the backtable. Donor Kidney Bx: Not applicable Allograft injury/complications: None Ne   Lower extremity edema 09/06/2013   Macular degeneration     Mild vascular neurocognitive disorder 02/08/2022   OAB (overactive bladder) 12/28/2019   Obstructive sleep apnea 09/06/2013   On Bi-PAP  Last Assessment & Plan:  Formatting of this note might be different from the original. - History of OSA, continue CPAP at night   Peritoneal dialysis catheter in place 02/24/2015   RLQ abdominal pain 04/19/2020   S/P AVR (aortic valve replacement) 10/05/2021   SBO (small bowel obstruction) 05/2015   Scrotal edema 03/14/2017   Testicular ultrasound did show hydrocele  Will refer to urology regarding ongoign swelling, hydrocele.   Last Assessment & Plan:  Formatting of this note might be different from the original.   Testicular ultrasound did show hydrocele  Will refer to urology regarding ongoign swelling, hydrocele.   Shortness of breath    with exertion   Stage 3b chronic kidney disease (CKD) 03/22/2014   SVT (supraventricular tachycardia) 09/06/2013   Tremor  of both hands 06/29/2017   Last Assessment & Plan:  Present since transplant, stable and unchanged Query from tacrolimus -Improved tremors noted and verbalized by pt - Prograf doses titrated per FK levels  Last Assessment & Plan:  Formatting of this note might be different from the original. Present since transplant, stable and unchanged Query from tacrolimus -Improved tremors noted and verbalized by pt - Prograf doses titr   Type 2 diabetes mellitus with hyperglycemia, with long-term current use of insulin 07/14/2015   Umbilical hernia    s/p repair 04/28/15    Medications: Outpatient Encounter Medications as of 06/17/2023  Medication Sig Note   acetaminophen (TYLENOL) 325 MG tablet Take 2 tablets (650 mg total) by mouth every 4 (four) hours as needed for moderate pain.    albuterol (VENTOLIN HFA) 108 (90 Base) MCG/ACT inhaler Inhale 1 puff into the lungs. Every hour as needed    amLODipine (NORVASC) 5 MG tablet TAKE 1 AND 1/2 TABLETS DAILY BY MOUTH    apixaban (ELIQUIS) 5 MG TABS tablet Take 1  tablet (5 mg total) by mouth 2 (two) times daily.    B-D UF III MINI PEN NEEDLES 31G X 5 MM MISC USE TO INJECT INSULIN 4 TIMES A DAY    belatacept (NULOJIX) 250 MG SOLR injection Inject 24.5 mLs into the vein every 28 (twenty-eight) days.  09/26/2021: Give at [facility]   carvedilol (COREG) 25 MG tablet Take 1 tablet (25 mg total) by mouth 2 (two) times daily with a meal.    Continuous Glucose Sensor (FREESTYLE LIBRE 3 SENSOR) MISC SMARTSIG:1 TOPICAL EVERY 2 WEEKS    doxazosin (CARDURA) 2 MG tablet Take 0.5 tablets (1 mg total) by mouth daily. Take 0.5 Tablet Daily    DULoxetine (CYMBALTA) 30 MG capsule Take 30 mg by mouth daily.    gabapentin (NEURONTIN) 100 MG capsule Take 200-300 mg by mouth See admin instructions. Taking 200 mg in the AM and 300 mg in the evening    hydrALAZINE (APRESOLINE) 100 MG tablet TAKE 1 TABLET BY MOUTH EVERY 8 HOURS    insulin aspart (NOVOLOG) 100 UNIT/ML injection Inject 5-20 Units into the skin daily as needed for high blood sugar. 03/15/2022: Patient uses per sliding scale   insulin regular human CONCENTRATED (HUMULIN R) 500 UNIT/ML injection Inject 125 Units into the skin every 3 (three) days. Omnipod    levothyroxine (SYNTHROID) 112 MCG tablet TAKE 1 TABLET BY MOUTH EVERY DAY    mupirocin ointment (BACTROBAN) 2 % 1 application Externally Twice a day for 7 days    mycophenolate (CELLCEPT) 250 MG capsule Take 500 mg by mouth every 12 (twelve) hours.    NEEDLE, DISP, 18 G (BD HYPODERMIC NEEDLE) 18G X 1" MISC USE AS DIRECTED TO INJECT 0.6MLS OF TESTOSTERONE EVERY 7 DAYS (DRAW UP)    nitroGLYCERIN (NITROSTAT) 0.4 MG SL tablet Place 0.4 mg under the tongue every 5 (five) minutes as needed for chest pain.    NON FORMULARY CPAP/BIPAP  at bedtime  BIPAP per pt    predniSONE (DELTASONE) 5 MG tablet Take 5 mg by mouth daily with breakfast. Continuously    rosuvastatin (CRESTOR) 20 MG tablet TAKE 1 TABLET BY MOUTH EVERY DAY    senna-docusate (SENOKOT-S) 8.6-50  MG tablet Take 1 tablet by mouth at bedtime as needed for mild constipation. (Patient taking differently: Take 1 tablet by mouth every other day.) 06/17/2023: As needed   Syringe, Disposable, 1 ML MISC Use to draw up 0.50ml of  testosterone medication    SYRINGE-NEEDLE, DISP, 3 ML (BD PLASTIPAK SYRINGE) 21G X 1" 3 ML MISC USE AS DIRECTED TO INJECT 0.6MLS OF TESTOSTERONE EVERY 7 DAYS (INJECT)    tamsulosin (FLOMAX) 0.4 MG CAPS capsule Take 0.8 mg by mouth daily after supper.    Testosterone Cypionate 200 MG/ML SOLN Inject 120 mg as directed once a week. Saturday    traMADol (ULTRAM) 50 MG tablet 1 tablet Oral Once a day for 90 days    traZODone (DESYREL) 50 MG tablet 1 tablet at bedtime as needed Orally Once a day (Do not take if take ambien)    zolpidem (AMBIEN) 10 MG tablet Take 10 mg by mouth at bedtime as needed for sleep.    zonisamide (ZONEGRAN) 25 MG capsule TAKE 3 CAPSULES (75 MG TOTAL) BY MOUTH DAILY    chlorthalidone (HYGROTON) 25 MG tablet Take 1 tablet by mouth daily.    No facility-administered encounter medications on file as of 06/17/2023.    Allergies: No Known Allergies  Family History: Family History  Problem Relation Age of Onset   Colon polyps Father    Stroke Father    Dementia Father        vascular   Colon cancer Neg Hx    Stomach cancer Neg Hx     Observations/Objective:   No acute distress.  Alert and oriented.  Speech fluent and not dysarthric.  Language intact.     Follow Up Instructions:    -I discussed the assessment and treatment plan with the patient. The patient was provided an opportunity to ask questions and all were answered. The patient agreed with the plan and demonstrated an understanding of the instructions.   The patient was advised to call back or seek an in-person evaluation if the symptoms worsen or if the condition fails to improve as anticipated.   Cira Servant, DO

## 2023-06-17 ENCOUNTER — Encounter: Payer: Self-pay | Admitting: Neurology

## 2023-06-17 ENCOUNTER — Telehealth (INDEPENDENT_AMBULATORY_CARE_PROVIDER_SITE_OTHER): Payer: 59 | Admitting: Neurology

## 2023-06-17 DIAGNOSIS — H832X3 Labyrinthine dysfunction, bilateral: Secondary | ICD-10-CM

## 2023-06-17 DIAGNOSIS — I999 Unspecified disorder of circulatory system: Secondary | ICD-10-CM

## 2023-06-17 DIAGNOSIS — I6310 Cerebral infarction due to embolism of unspecified precerebral artery: Secondary | ICD-10-CM

## 2023-06-17 DIAGNOSIS — F0670 Mild neurocognitive disorder due to known physiological condition without behavioral disturbance: Secondary | ICD-10-CM

## 2023-06-17 DIAGNOSIS — R519 Headache, unspecified: Secondary | ICD-10-CM | POA: Diagnosis not present

## 2023-06-17 DIAGNOSIS — G8929 Other chronic pain: Secondary | ICD-10-CM

## 2023-06-18 ENCOUNTER — Telehealth (INDEPENDENT_AMBULATORY_CARE_PROVIDER_SITE_OTHER): Payer: 59 | Admitting: Cardiovascular Disease

## 2023-06-18 DIAGNOSIS — M7989 Other specified soft tissue disorders: Secondary | ICD-10-CM

## 2023-06-18 NOTE — Telephone Encounter (Signed)
Returned call to pt with J. Cleaver's recommendation. LVM to return our call   Please prescribe furosemide 20 mg to be taken in the morning x 3 days.    We will also prescribe 10 mill equivalents of potassium x 3 days.    He will need a BMP drawn during his office visit.  Please ask him to follow a low-sodium diet and limit his fluids to less than 50-60 ounces daily.    Please ask him to use his lower extremity compression machine and keep a daily weight log.   Thank you.   Thomasene Ripple. Cleaver NP-C

## 2023-06-18 NOTE — Telephone Encounter (Signed)
Call to patient .  Patient states increased swelling for approx 2 weeks. Bilateral LE Edema and Right arm. States increased SOB, unable to sleep as "feels fluid in his chest".  Sleeping in recliner at this time due to issues. No compression socks being worn and has compression machine he bought online,  but has not used it. Watching salt intake. Hydrating well.  Today's weight is 306. Had been at 294 but has been increasing. Takes Chlorthalidone 25 mg Daily. He states mention in an OV that he was to double dose if needed and if this didn't work, possibly be prescribed Lasix.  He has not doubled the medication due to not realizing his weight had increased.  He is unavailable tomorrow or Friday.  First available visit is Monday and he is set for this.  Please advise if he needs to increase his medication, or start Lasix prior to Monday OV.   Weight 8/28  294 7/25  290 7/10  289 5/24  296

## 2023-06-18 NOTE — Telephone Encounter (Signed)
Please prescribe furosemide 20 mg to be taken in the morning x 3 days.  We will also prescribe 10 mill equivalents of potassium x 3 days.  He will need a BMP drawn during his office visit. Please ask him to follow a low-sodium diet and limit his fluids to less than 50-60 ounces daily.  Please ask him to use his lower extremity compression machine and keep a daily weight log.  Thank you.  Robert [name]. Etheridge Geil NP-C     06/18/2023, 2:11 PM [facility] [address] Office [phone] Fax [phone]  I spent greater than 10 minutes reviewing this patient's past medical history and cardiac medications.  I spent 2 minutes drafting a response to this patient after reviewing his measures.

## 2023-06-18 NOTE — Telephone Encounter (Signed)
Pt c/o swelling: STAT is pt has developed SOB within 24 hours  How much weight have you gained and in what time span? Last few weeks 12 lb increase   If swelling, where is the swelling located? Edema in both feet and right arm   Are you currently taking a fluid pill? Yes   Are you currently SOB? Yes   Do you have a log of your daily weights (if so, list)?   Have you gained 3 pounds in a day or 5 pounds in a week? Yes   Have you traveled recently? No     Patient is requesting call back to discuss swelling and change in medication.

## 2023-06-19 MED ORDER — FUROSEMIDE 20 MG PO TABS
20.0000 mg | ORAL_TABLET | Freq: Every day | ORAL | 0 refills | Status: DC
Start: 2023-06-19 — End: 2023-06-19

## 2023-06-19 MED ORDER — POTASSIUM CHLORIDE CRYS ER 20 MEQ PO TBCR: EXTENDED_RELEASE_TABLET | ORAL | 3 refills | Status: DC

## 2023-06-19 MED ORDER — FUROSEMIDE 20 MG PO TABS: 20.0000 mg | ORAL_TABLET | Freq: Every day | ORAL | 0 refills | Status: DC

## 2023-06-19 MED ORDER — FUROSEMIDE 20 MG PO TABS
20.0000 mg | ORAL_TABLET | Freq: Every day | ORAL | 3 refills | Status: DC
Start: 2023-06-19 — End: 2023-07-04

## 2023-06-19 MED ORDER — POTASSIUM CHLORIDE CRYS ER 20 MEQ PO TBCR: EXTENDED_RELEASE_TABLET | ORAL | 0 refills | Status: DC

## 2023-06-19 NOTE — Telephone Encounter (Signed)
Call to patient and discussed in detail all instructions.  He states understanding. Medication sent to pharmacy, did not go through initially so resent.  Patient understands to take potassium with Lasix for 3 days and then will speak wiith provider at Monday appt.  Lab ordered. He understands all instructions

## 2023-06-20 ENCOUNTER — Encounter: Payer: Self-pay | Admitting: Podiatry

## 2023-06-20 ENCOUNTER — Ambulatory Visit (INDEPENDENT_AMBULATORY_CARE_PROVIDER_SITE_OTHER): Payer: 59 | Admitting: Podiatry

## 2023-06-20 DIAGNOSIS — E1149 Type 2 diabetes mellitus with other diabetic neurological complication: Secondary | ICD-10-CM | POA: Diagnosis not present

## 2023-06-20 DIAGNOSIS — B351 Tinea unguium: Secondary | ICD-10-CM

## 2023-06-20 DIAGNOSIS — M79674 Pain in right toe(s): Secondary | ICD-10-CM | POA: Diagnosis not present

## 2023-06-20 DIAGNOSIS — M79675 Pain in left toe(s): Secondary | ICD-10-CM

## 2023-06-20 NOTE — Progress Notes (Signed)
This patient returns to my office for at risk foot care.  This patient requires this care by a professional since this patient will be at risk due to having uncontrolled diabetes and kidney transplant.  This patient is unable to cut nails himself since the patient cannot reach his nails.These nails are painful walking and wearing shoes.  Resolved heel fissure.   This patient presents for at risk foot care today.  General Appearance  Alert, conversant and in no acute stress.  Vascular  Dorsalis pedis and posterior tibial  pulses are palpable  bilaterally.  Capillary return is within normal limits  bilaterally. Temperature is within normal limits  bilaterally.  Neurologic  Senn-Weinstein monofilament wire test diminished   bilaterally. Muscle power within normal limits bilaterally.  Nails Thick disfigured discolored nails with subungual debris  from hallux to fifth toes bilaterally. No evidence of bacterial infection or drainage bilaterally.  Orthopedic  No limitations of motion  feet .  No crepitus or effusions noted.  No bony pathology or digital deformities noted. HAV  B/L.  Skin  normotropic skin with no porokeratosis noted bilaterally.  No signs of infections or ulcers noted.     Onychomycosis  Pain in right toes  Pain in left toes   Consent was obtained for treatment procedures.   Mechanical debridement of nails 1-5  bilaterally performed with a nail nipper.  Filed with dremel without incident.     Return office visit  3 months                   Told patient to return for periodic foot care and evaluation due to potential at risk complications.   Gregory Mayer DPM  

## 2023-06-21 NOTE — Telephone Encounter (Signed)
F/u scheduled

## 2023-06-23 NOTE — Progress Notes (Unsigned)
Cardiology Clinic Note   Patient Name: Robert [name] Date of Encounter: 06/24/2023  Primary Care Provider:  Gaspar Garbe, MD Primary Cardiologist:  Nicki Guadalajara, MD  Patient Profile    62 year old with hx of acute on chronic diastolic CHF, CAD, s/p renal transplant, Aortic Valve replacement 2023, HTN, HL, PAF on Eliquis.   Past Medical History    Past Medical History:  Diagnosis Date   Acute cystitis without hematuria 11/09/2021   AKI (acute kidney injury) 08/08/2017   Elevated sCr to 2.5 (baseline ~1.7) noted at OSH - Subsequent transfer to [facility]  - Currently on IVMF in the setting of diarrhea and AKI - sCr at baseline at time of discharge   Anemia in chronic kidney disease 03/30/2015   Anticoagulated on Coumadin 11/09/2021   Aortic valve stenosis    Arthritis    Atrial fibrillation 11/29/2021   Benign essential hypertension 07/30/2014   prior to transplant pt took Metorpolol tartrate 50mg  qd on off HD days   Benign neoplasm of transverse colon    Cancer    skin ca right shoulder, plastic dsyplasia, pre-Ca polpys removed on Colonoscopy- 07/2014   Cerebrovascular accident (CVA) due to embolism of precerebral artery    Chronic renal failure    Colon polyps 07/23/2014   Tubular adenomas x 5   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   Diverticulosis of colon without hemorrhage    Eczema    ESRD (end stage renal disease)    hemodialysis 06/2014-02/2017, s/p living unrelated kidney transplant 02/26/17   Exertional dyspnea    Fatigue 01/08/2014   Fissure of skin 03/08/2020   GI bleed 07/26/2017   Headache    Heart murmur    aortic stenosis (moderate-severe 04/2021)   History of COVID-19 2023   History of unilateral nephrectomy 09/06/2013    Hydrocele, acquired 04/07/2017   Hyperlipidemia 09/06/2013   Hypertensive urgency 10/21/2021   Hyponatremia 11/09/2021   Hypothyroidism    Immunosuppressed status 02/26/2017   Induction agent: Solumedrol - Envarsus 3mg  daily  - Home dose decreased from 5mg  to 3mg  in the setting of FK 15.7 while inpatient   - Previously on Prograf, though d/c'ed during previous admission; suspected this may be causing hallucinations  - Cellcept 1000mg  Q12H  - Prednisone 5mg  daily   Inguinal hernia without obstruction or gangrene 06/30/2017   Right inguinal wall defect appreciated by CT imaging [place] and on exam Dr. Roby Lofts today Dr. Roby Lofts has discussed hernia repair with mesh at some point in near future  Last Assessment & Plan:  Formatting of this note might be different from the original. Right inguinal wall defect appreciated by CT imaging [place] and on exam Dr. Roby Lofts today Dr. Roby Lofts has discussed hern   Kidney transplanted 02/26/2017   Date of Transplant = 02/26/17  ABO (Donor/Recipient) = compatible Blood type: O+/A+ DonorType: Living donor, unrelated Allograft type:Left kidney Donor anatomy: 2 arteries, 1 veins and single ureter  Pants-to-skirt anastomosis of dual renal arteries on the backtable. Donor Kidney Bx: Not applicable Allograft injury/complications: None Ne   Lower extremity edema 09/06/2013   Macular degeneration    Mild vascular neurocognitive disorder 02/08/2022   OAB (overactive bladder) 12/28/2019   Obstructive sleep apnea 09/06/2013   On Bi-PAP  Last Assessment & Plan:  Formatting of this note might be different from the original. - History of OSA, continue CPAP at night   Peritoneal dialysis catheter in place 02/24/2015   RLQ abdominal pain 04/19/2020   S/P AVR (aortic valve replacement) 10/05/2021   SBO (small bowel obstruction) 05/2015   Scrotal edema 03/14/2017   Testicular ultrasound did show hydrocele  Will refer to urology regarding ongoign swelling, hydrocele.   Last Assessment &  Plan:  Formatting of this note might be different from the original.   Testicular ultrasound did show hydrocele  Will refer to urology regarding ongoign swelling, hydrocele.   Shortness of breath    with exertion   Stage 3b chronic kidney disease (CKD) 03/22/2014   SVT (supraventricular tachycardia) 09/06/2013   Tremor of both hands 06/29/2017   Last Assessment & Plan:  Present since transplant, stable and unchanged Query from tacrolimus -Improved tremors noted and verbalized by pt - Prograf doses titrated per FK levels  Last Assessment & Plan:  Formatting of this note might be different from the original. Present since transplant, stable and unchanged Query from tacrolimus -Improved tremors noted and verbalized by pt - Prograf doses titr   Type 2 diabetes mellitus with hyperglycemia, with long-term current use of insulin 07/14/2015   Umbilical hernia    s/p repair 04/28/15   Past Surgical History:  Procedure Laterality Date   AORTIC VALVE REPLACEMENT N/A 10/05/2021   Procedure: AORTIC VALVE REPLACEMENT (AVR) WITH INSPIRIS RESILIA AORTIC VALVE SIZE ;  Surgeon: Alleen Borne, MD;  Location: [facility] OR;  Service: Open Heart Surgery;  Laterality: N/A;   AV FISTULA PLACEMENT Right 02/25/2015   Procedure: RIGHT ARTERIOVENOUS (AV) FISTULA CREATION;  Surgeon: Nada Libman, MD;  Location: [facility] OR;  Service: Vascular;  Laterality: Right;   AV FISTULA PLACEMENT Right 09/15/2015   Procedure: ARTERIOVENOUS (AV) FISTULA CREATION- RIGHT ARM;  Surgeon: Nada Libman, MD;  Location: [facility] OR;  Service: Vascular;  Laterality: Right;   CAPD INSERTION N/A 05/18/2014   Procedure: LAPAROSCOPIC INSERTION CONTINUOUS AMBULATORY PERITONEAL DIALYSIS  (CAPD) CATHETER;  Surgeon: Axel Filler, MD;  Location: [facility] OR;  Service: General;  Laterality: N/A;   COLONOSCOPY     COLONOSCOPY WITH PROPOFOL N/A 07/12/2016   Procedure: COLONOSCOPY WITH PROPOFOL;  Surgeon: Rachael Fee, MD;  Location: [facility];  Service:  Endoscopy;  Laterality: N/A;   CORONARY ARTERY BYPASS GRAFT N/A 10/05/2021   Procedure: CORONARY ARTERY BYPASS GRAFTING (CABG) X 1, ON PUMP, USING LEFT INTERNAL MAMMARY ARTERY CONDUIT;  Surgeon: Alleen Borne, MD;  Location: [facility] OR;  Service: Open Heart Surgery;  Laterality: N/A;   declotting of fistula  08/2015   ESOPHAGOGASTRODUODENOSCOPY (EGD) WITH PROPOFOL N/A 07/12/2016   Procedure: ESOPHAGOGASTRODUODENOSCOPY (EGD) WITH PROPOFOL;  Surgeon: Rachael Fee, MD;  Location: [facility];  Service: Endoscopy;  Laterality: N/A;   FISTULA SUPERFICIALIZATION Right 02/07/2016   Procedure: RIIGHT UPPER ARM FISTULA SUPERFICIALIZATION;  Surgeon: Sherren Kerns, MD;  Location: [facility] OR;  Service: Vascular;  Laterality: Right;   IJ catheter insertion     INSERTION OF DIALYSIS CATHETER Right 02/25/2015   Procedure: INSERTION OF RIGHT INTERNAL JUGULAR DIALYSIS CATHETER;  Surgeon: Nada Libman, MD;  Location: [facility] OR;  Service: Vascular;  Laterality: Right;   KIDNEY TRANSPLANT     LAPAROSCOPIC REPOSITIONING CAPD CATHETER N/A 06/16/2014   Procedure: LAPAROSCOPIC REPOSITIONING CAPD CATHETER;  Surgeon: Axel Filler, MD;  Location: [facility] OR;  Service: General;  Laterality: N/A;  LAPAROSCOPY N/A 05/04/2015   Procedure: LAPAROSCOPY DIAGNOSTIC LYSIS OF ADHESIONS;  Surgeon: Abigail Miyamoto, MD;  Location: [facility] OR;  Service: General;  Laterality: N/A;   MINOR REMOVAL OF PERITONEAL DIALYSIS CATHETER N/A 04/28/2015   Procedure:  REMOVAL OF PERITONEAL DIALYSIS CATHETER;  Surgeon: Abigail Miyamoto, MD;  Location: [facility];  Service: General;  Laterality: N/A;   NEPHRECTOMY Left 1974   RENAL BIOPSY Right 2012   REVISON OF ARTERIOVENOUS FISTULA Right 05/26/2015   Procedure: SUPERFICIALIZATION OF ARTERIOVENOUS FISTULA WITH SIDE BRANCH LIGATIONS;  Surgeon: Nada Libman, MD;  Location: [facility] OR;  Service: Vascular;  Laterality: Right;   RIGHT/LEFT HEART CATH AND CORONARY ANGIOGRAPHY N/A 08/22/2021   Procedure: RIGHT/LEFT HEART CATH  AND CORONARY ANGIOGRAPHY;  Surgeon: Lennette Bihari, MD;  [facility];  Service: Cardiovascular;  Laterality: N/A;   SKIN CANCER EXCISION     right shoulder   SVT ABLATION N/A 11/23/2016   Procedure: SVT Ablation;  Surgeon: Will Jorja Loa, MD;  [facility];  Service: Cardiovascular;  Laterality: N/A;   TEE WITHOUT CARDIOVERSION N/A 10/05/2021   Procedure: TRANSESOPHAGEAL ECHOCARDIOGRAM (TEE);  Surgeon: Alleen Borne, MD;  Location: [facility] OR;  Service: Open Heart Surgery;  Laterality: N/A;   UMBILICAL HERNIA REPAIR N/A 05/18/2014   Procedure: HERNIA REPAIR UMBILICAL ADULT;  Surgeon: Axel Filler, MD;  Location: [facility] OR;  Service: General;  Laterality: N/A;   UMBILICAL HERNIA REPAIR N/A 04/28/2015   Procedure: UMBILICAL HERNIA REPAIR WITH MESH;  Surgeon: Abigail Miyamoto, MD;  Location: [facility] OR;  Service: General;  Laterality: N/A;    Allergies  No Known Allergies  History of Present Illness    Mr. Neuhoff comes today very sad, lethargic, tired, complaining of shortness of breath, weight gain, PND, orthopnea, and lower extremity edema.  The patient had been going to have infusions and was told by NP there to follow-up with cardiology as he had gained approximately 27 pounds since December 2023 and 9 pounds since May 21, 2023 with worsening peripheral edema decreased breath sounds and shortness of breath.  The patient was placed on Lasix 20 mg daily with 10 mEq potassium which he started 7 days ago.  He lost 5 pounds and continues to have complaints.  He is able to feel that his heart is out of rhythm.  He is exhausted.  He refuses hospitalization as this was offered to him when he was seen last by nurse practitioner.  Home Medications    Current Outpatient Medications  Medication Sig Dispense Refill   acetaminophen (TYLENOL) 325 MG tablet Take 2 tablets (650 mg total) by mouth every 4 (four) hours as needed for moderate pain.     albuterol (VENTOLIN HFA)  108 (90 Base) MCG/ACT inhaler Inhale 1 puff into the lungs. Every hour as needed     amLODipine (NORVASC) 5 MG tablet TAKE 1 AND 1/2 TABLETS DAILY BY MOUTH 135 tablet 3   apixaban (ELIQUIS) 5 MG TABS tablet Take 1 tablet (5 mg total) by mouth 2 (two) times daily. 180 tablet 3   B-D UF III MINI PEN NEEDLES 31G X 5 MM MISC USE TO INJECT INSULIN 4 TIMES A DAY 360 each 2   belatacept (NULOJIX) 250 MG SOLR injection Inject 24.5 mLs into the vein every 28 (twenty-eight) days.      carvedilol (COREG) 25 MG tablet Take 1 tablet (25 mg total) by mouth 2 (two) times daily with a meal.     Continuous Glucose  Sensor (FREESTYLE LIBRE 3 SENSOR) MISC SMARTSIG:1 TOPICAL EVERY 2 WEEKS 6 each 2   doxazosin (CARDURA) 2 MG tablet Take 0.5 tablets (1 mg total) by mouth daily. Take 0.5 Tablet Daily 90 tablet 0   DULoxetine (CYMBALTA) 30 MG capsule Take 30 mg by mouth daily.     furosemide (LASIX) 20 MG tablet Take 1 tablet (20 mg total) by mouth daily. Take Lasix for 3 days then call office for further directions 90 tablet 3   gabapentin (NEURONTIN) 100 MG capsule Take 200-300 mg by mouth See admin instructions. Taking 200 mg in the AM and 300 mg in the evening     hydrALAZINE (APRESOLINE) 100 MG tablet TAKE 1 TABLET BY MOUTH EVERY 8 HOURS 90 tablet 3   insulin aspart (NOVOLOG) 100 UNIT/ML injection Inject 5-20 Units into the skin daily as needed for high blood sugar.     insulin regular human CONCENTRATED (HUMULIN R) 500 UNIT/ML injection Inject 125 Units into the skin every 3 (three) days. Omnipod     levothyroxine (SYNTHROID) 112 MCG tablet TAKE 1 TABLET BY MOUTH EVERY DAY 90 tablet 3   mupirocin ointment (BACTROBAN) 2 % 1 application Externally Twice a day for 7 days     mycophenolate (CELLCEPT) 250 MG capsule Take 500 mg by mouth every 12 (twelve) hours.     NEEDLE, DISP, 18 G (BD HYPODERMIC NEEDLE) 18G X 1" MISC USE AS DIRECTED TO INJECT 0.6MLS OF TESTOSTERONE EVERY 7 DAYS (DRAW UP) 100 each 1   nitroGLYCERIN  (NITROSTAT) 0.4 MG SL tablet Place 0.4 mg under the tongue every 5 (five) minutes as needed for chest pain.     NON FORMULARY CPAP/BIPAP  at bedtime  BIPAP per pt     potassium chloride SA (KLOR-CON M20) 20 MEQ tablet Take half a tablet , Potassium 10 meq, for 3 days with 20 mg Lasix as prescribed.  Then call office for new instructions. 90 tablet 3   predniSONE (DELTASONE) 5 MG tablet Take 5 mg by mouth daily with breakfast. Continuously     rosuvastatin (CRESTOR) 20 MG tablet TAKE 1 TABLET BY MOUTH EVERY DAY 90 tablet 0   senna-docusate (SENOKOT-S) 8.6-50 MG tablet Take 1 tablet by mouth at bedtime as needed for mild constipation. (Patient taking differently: Take 1 tablet by mouth every other day.)     Syringe, Disposable, 1 ML MISC Use to draw up 0.63ml of testosterone medication 25 each 0   SYRINGE-NEEDLE, DISP, 3 ML (BD PLASTIPAK SYRINGE) 21G X 1" 3 ML MISC USE AS DIRECTED TO INJECT 0.6MLS OF TESTOSTERONE EVERY 7 DAYS (INJECT) 100 each 1   tamsulosin (FLOMAX) 0.4 MG CAPS capsule Take 0.8 mg by mouth daily after supper.     Testosterone Cypionate 200 MG/ML SOLN Inject 120 mg as directed once a week. Saturday     traMADol (ULTRAM) 50 MG tablet 1 tablet Oral Once a day for 90 days     traZODone (DESYREL) 50 MG tablet 1 tablet at bedtime as needed Orally Once a day (Do not take if take ambien)     zolpidem (AMBIEN) 10 MG tablet Take 10 mg by mouth at bedtime as needed for sleep.     zonisamide (ZONEGRAN) 25 MG capsule TAKE 3 CAPSULES (75 MG TOTAL) BY MOUTH DAILY 270 capsule 1   chlorthalidone (HYGROTON) 25 MG tablet Take 1 tablet by mouth daily.     No current facility-administered medications for this visit.     Family History  Family History  Problem Relation Age of Onset   Colon polyps Father    Stroke Father    Dementia Father        vascular   Colon cancer Neg Hx    Stomach cancer Neg Hx    He indicated that his mother is deceased. He indicated that his father is deceased. He  indicated that all of his three sisters are alive. He indicated that both of his brothers are alive. He indicated that his maternal grandmother is deceased. He indicated that his maternal grandfather is deceased. He indicated that his paternal grandmother is deceased. He indicated that his paternal grandfather is deceased. He indicated that the status of his neg hx is unknown.  Social History    Social History   Socioeconomic History   Marital status: Married    Spouse name: Not on file   Number of children: 3   Years of education: 16   Highest education level: Bachelor's degree (e.g., BA, AB, BS)  Occupational History   Occupation: English as a second language teacher: [employer]  Tobacco Use   Smoking status: Former    Types: Cigars    Quit date: 12/06/2012    Years since quitting: 10.5   Smokeless tobacco: Never  Vaping Use   Vaping status: Never Used  Substance and Sexual Activity   Alcohol use: Not Currently   Drug use: No   Sexual activity: Not on file  Other Topics Concern   Not on file  Social History Narrative   Left handed    Lives with wife   Social Determinants of Health   Financial Resource Strain: Low Risk  (03/21/2022)   Overall Financial Resource Strain (CARDIA)    Difficulty of Paying Living Expenses: Not very hard  Food Insecurity: No Food Insecurity (03/21/2022)   Hunger Vital Sign    Worried About Running Out of Food in the Last Year: Never true    Ran Out of Food in the Last Year: Never true  Transportation Needs: No Transportation Needs (03/21/2022)   PRAPARE - Administrator, Civil Service (Medical): No    Lack of Transportation (Non-Medical): No  Physical Activity: Not on file  Stress: Not on file  Social Connections: Unknown (02/17/2022)   Received from [facility], [facility]   Social Network    Social Network: Not on file  Intimate Partner Violence: Unknown (01/09/2022)   Received from [facility], [facility]   HITS    Physically Hurt: Not on  file    Insult or Talk Down To: Not on file    Threaten Physical Harm: Not on file    Scream or Curse: Not on file     Review of Systems    General:  No chills, fever, night sweats or weight changes.  Cardiovascular:  No chest pain, positive for worsening dyspnea on exertion, worsening edema, orthopnea, palpitations, worsening paroxysmal nocturnal dyspnea. Dermatological: No rash, lesions/masses Respiratory: No cough, positive for worsening dyspnea Urologic: No hematuria, dysuria Abdominal:   No nausea, vomiting, diarrhea, bright red blood per rectum, melena, or hematemesis Neurologic:  No visual changes, wkns, changes in mental status.  States that he is dizzy All other systems reviewed and are otherwise negative except as noted above.       Physical Exam    VS:  BP 114/60 (BP Location: Left Arm, Patient Position: Sitting, Cuff Size: Large)   Pulse 80   Ht 5\' 11"  (1.803 m)   Wt Marland Kitchen)  302 lb (137 kg)   SpO2 95%   BMI 42.12 kg/m  , BMI Body mass index is 42.12 kg/m.     GEN: Well nourished, well developed, in no acute distress. HEENT: normal. Neck: Supple, no JVD, carotid bruits, or masses. Cardiac: IRRR, no murmurs, rubs, or gallops. No clubbing, cyanosis, 2+ - 3+ pretibial edema.  Radials/DP/PT 2+ and equal bilaterally.  Respiratory:  Respirations regular and unlabored, clear to auscultation bilaterally poor inspiratory effort with diminished breath sounds bilaterally. GI: Soft, nontender, mildly distended, BS + x 4. MS: no deformity or atrophy. Skin: warm and dry, no rash. Neuro:  Strength and sensation are intact. Psych: Flat affect, lethargic, hard of hearing      Lab Results  Component Value Date   WBC 6.9 10/15/2022   HGB 12.9 (L) 10/15/2022   HCT 38.0 (L) 10/15/2022   MCV 87.5 10/15/2022   PLT 208 10/15/2022   Lab Results  Component Value Date   CREATININE 2.60 (H) 04/29/2023   BUN 43 (H) 04/29/2023   NA 138 04/29/2023   K 3.3 (L) 04/29/2023   CL 102  04/29/2023   CO2 22 04/29/2023   Lab Results  Component Value Date   ALT 13 10/15/2022   AST 19 10/15/2022   ALKPHOS 50 10/15/2022   BILITOT 1.0 10/15/2022   Lab Results  Component Value Date   CHOL 116 05/02/2022   HDL 52 05/02/2022   LDLCALC 55 05/02/2022   TRIG 45 05/02/2022   CHOLHDL 2.2 05/02/2022    Lab Results  Component Value Date   HGBA1C 7.6 (H) 04/29/2023     Review of Prior Studies Echocardiogram 2024     1. Left ventricular ejection fraction, by estimation, is 65 to 70%. The  left ventricle has normal function. The left ventricle has no regional  wall motion abnormalities. There is severe left ventricular hypertrophy.  Left ventricular diastolic parameters   are indeterminate.   2. Right ventricular systolic function is normal. The right ventricular  size is mildly enlarged.   3. Left atrial size was mildly dilated.   4. The mitral valve is normal in structure. Mild mitral valve  regurgitation. No evidence of mitral stenosis. Severe mitral annular  calcification.   5. The aortic valve has been repaired/replaced. Aortic valve  regurgitation is not visualized. Mild aortic valve stenosis. There is a 27  mm Edwards Inspiris Resilia Prosthesis valve present in the aortic  position. Echo findings are consistent with normal   structure and function of the aortic valve prosthesis. Aortic valve mean  gradient measures 11.0 mmHg. Aortic valve Vmax measures 2.23 m/s.   6. The inferior vena cava is dilated in size with >50% respiratory  variability, suggesting right atrial pressure of 8 mmHg.   Assessment & Plan   1.  Acute on chronic diastolic CHF: The patient has gained a minimum of 12 pounds over her dry weight of 289 pounds which was listed when seen by Dr. Anne Fu back in July 2024.  He has gained a total of 27 pounds since December 2023.  He admits to dietary noncompliance with salted foods.  He is symptomatic with PND, orthopnea, dyspnea, and lower extremity  edema.  He had been placed on Lasix 20 mg ,with 10 mill equivalents of potassium daily for 7 days with only a 5 pound weight loss.  He comes today lethargic and short of breath.  O2 sat however is 95%.  I have discussed with him 3 options of #1  hospitalization with IV diuresis, # 2 Furosix infusion over 5 hours, or # 3 increasing dose of p.o. Lasix.  The patient adamantly refuses hospitalization, is uncomfortable with 406 infusion, and therefore I will increase the furosemide to 40 mg daily with 20 mill equivalents of potassium for 3 days, and then reduce the dose to 20 mg of furosemide with 10 mg potassium thereafter.  I want to see him back by the end of the week.  The patient refuses to come back as he states he is going to the beach and will not be home and does not want to change his plans.  Will see him back in approximately 7 days.  [place]  I advised him to go to the hospital if he is unable to breathe, has continued fluid retention, PND or orthopnea despite p.o. Lasix increased.  He is also given a copy of salty 6 and told to weigh himself daily.  Drawing BMET, CBC, and BNP today before he leaves the office.  2.  PAF: Patient is currently in atrial fibrillation heart rate 80 bpm.  He is to continue on apixaban 5 mg twice daily, he is not on any AV nodal blocking agents currently.  3.  Hypertension: He does take hydralazine 100 mg every 8 hours.  Blood pressure is low normal.  He is also on amlodipine 5 mg daily.  He is to keep up with his blood pressure to avoid volume depletion on extra doses of Lasix.  If blood pressure decreases significantly less than 100 systolic or he has significant lightheadedness or dizziness or near syncope, he is to not take the Lasix that day and seek medical attention.         Signed, Bettey Mare. Liborio Nixon, ANP, AACC   06/24/2023 4:05 PM      Office [phone] Fax [phone]  Notice:  This dictation was prepared with Dragon dictation along with smaller phrase technology. Any transcriptional errors that result from this process are unintentional and may not be corrected upon review.

## 2023-06-24 ENCOUNTER — Ambulatory Visit: Payer: 59 | Attending: Adult Health | Admitting: Adult Health

## 2023-06-24 ENCOUNTER — Encounter: Payer: Self-pay | Admitting: Adult Health

## 2023-06-24 VITALS — BP 114/60 | HR 80 | Ht 71.0 in | Wt 302.0 lb

## 2023-06-24 DIAGNOSIS — I5032 Chronic diastolic (congestive) heart failure: Secondary | ICD-10-CM

## 2023-06-24 DIAGNOSIS — I10 Essential (primary) hypertension: Secondary | ICD-10-CM | POA: Diagnosis not present

## 2023-06-24 DIAGNOSIS — I480 Paroxysmal atrial fibrillation: Secondary | ICD-10-CM | POA: Diagnosis not present

## 2023-06-24 NOTE — Patient Instructions (Signed)
Medication Instructions:  Tuesday,Wednesday , Thursday, Take 40 mg of Lasix with 20 meq of Potasium. Friday resume 20 mg of Lasix and 10 meq of Potassium. *If you need a refill on your cardiac medications before your next appointment, please call your pharmacy*   Lab Work: BMET, [facility] Today If you have labs (blood work) drawn today and your tests are completely normal, you will receive your results only by: MyChart Message (if you have MyChart) OR A paper copy in the mail If you have any lab test that is abnormal or we need to change your treatment, we will call you to review the results.   Testing/Procedures: No Testing   Follow-Up: At [facility], you and your health needs are our priority.  As part of our continuing mission to provide you with exceptional heart care, we have created designated Provider Care Teams.  These Care Teams include your primary Cardiologist (physician) and Advanced Practice Providers (APPs -  Physician Assistants and Nurse Practitioners) who all work together to provide you with the care you need, when you need it.  We recommend signing up for the patient portal called "MyChart".  Sign up information is provided on this After Visit Summary.  MyChart is used to connect with patients for Virtual Visits (Telemedicine).  Patients are able to view lab/test results, encounter notes, upcoming appointments, etc.  Non-urgent messages can be sent to your provider as well.   To learn more about what you can do with MyChart, go to [email or web address].    Your next appointment:   1 week(s)  Provider:   Joni Reining, DNP, ANP

## 2023-06-25 ENCOUNTER — Telehealth: Payer: Self-pay

## 2023-06-25 ENCOUNTER — Telehealth: Payer: Self-pay | Admitting: Adult Health

## 2023-06-25 LAB — CBC
Hematocrit: 42.7 % (ref 37.5–51.0)
Hemoglobin: 13.3 g/dL (ref 13.0–17.7)
MCH: 26.8 pg (ref 26.6–33.0)
MCHC: 31.1 g/dL — ABNORMAL LOW (ref 31.5–35.7)
MCV: 86 fL (ref 79–97)
Platelets: 201 10*3/uL (ref 150–450)
RBC: 4.96 x10E6/uL (ref 4.14–5.80)
RDW: 14.7 % (ref 11.6–15.4)
WBC: 11.8 10*3/uL — ABNORMAL HIGH (ref 3.4–10.8)

## 2023-06-25 LAB — BASIC METABOLIC PANEL
BUN/Creatinine Ratio: 14 (ref 10–24)
BUN: 43 mg/dL — ABNORMAL HIGH (ref 8–27)
CO2: 21 mmol/L (ref 20–29)
Calcium: 9.5 mg/dL (ref 8.6–10.2)
Chloride: 100 mmol/L (ref 96–106)
Creatinine, Ser: 3.05 mg/dL — ABNORMAL HIGH (ref 0.76–1.27)
Glucose: 269 mg/dL — ABNORMAL HIGH (ref 70–99)
Potassium: 3.7 mmol/L (ref 3.5–5.2)
Sodium: 139 mmol/L (ref 134–144)
eGFR: 22 mL/min/{1.73_m2} — ABNORMAL LOW (ref >59)

## 2023-06-25 LAB — BRAIN NATRIURETIC PEPTIDE: BNP: 207.7 pg/mL — ABNORMAL HIGH (ref 0.0–100.0)

## 2023-06-25 NOTE — Telephone Encounter (Signed)
Called patient this morning with lab results to inform him of worsening creatinine on labs drawn on 06/24/2023.  Creatinine 3.05.  He is to stop lasix immediately and also stop chlorthalidone. He is to report to ED.  He refuses [place] and prefers to go to [facility] as this is where is kidney transplant team is located. He verbalizes understanding concerning holding lasix and holding chlorthalidone. He is will to be seen in hospital.

## 2023-06-25 NOTE — Telephone Encounter (Addendum)
Patient called by provider Joni Reining . Patient advised to go to hospital . Patient stated will going to [facility] .----- Message from Joni Reining sent at 06/25/2023  9:17 AM EDT ----- Called patient to inform him of labs and advised him to go to ED. He wishes to go to [facility] instead of [facility] hospital as his kidney transplant team is there.  I have made addendum to office note and documented the phone call in Epic

## 2023-07-01 ENCOUNTER — Other Ambulatory Visit (INDEPENDENT_AMBULATORY_CARE_PROVIDER_SITE_OTHER): Payer: 59

## 2023-07-01 DIAGNOSIS — E1165 Type 2 diabetes mellitus with hyperglycemia: Secondary | ICD-10-CM | POA: Diagnosis not present

## 2023-07-01 DIAGNOSIS — Z794 Long term (current) use of insulin: Secondary | ICD-10-CM | POA: Diagnosis not present

## 2023-07-01 DIAGNOSIS — E039 Hypothyroidism, unspecified: Secondary | ICD-10-CM

## 2023-07-01 LAB — BASIC METABOLIC PANEL
BUN: 43 mg/dL — ABNORMAL HIGH (ref 6–23)
CO2: 24 mEq/L (ref 19–32)
Calcium: 9.7 mg/dL (ref 8.4–10.5)
Chloride: 104 mEq/L (ref 96–112)
Creatinine, Ser: 2.47 mg/dL — ABNORMAL HIGH (ref 0.40–1.50)
GFR: 26.95 mL/min — ABNORMAL LOW (ref >60.00)
Glucose, Bld: 144 mg/dL — ABNORMAL HIGH (ref 70–99)
Potassium: 4 mEq/L (ref 3.5–5.1)
Sodium: 140 mEq/L (ref 135–145)

## 2023-07-01 LAB — TESTOSTERONE: Testosterone: 575.11 ng/dL (ref 300.00–890.00)

## 2023-07-01 LAB — TSH: TSH: 2.04 u[IU]/mL (ref 0.35–5.50)

## 2023-07-01 LAB — HEMOGLOBIN A1C: Hgb A1c MFr Bld: 7.9 % — ABNORMAL HIGH (ref 4.6–6.5)

## 2023-07-01 LAB — T4, FREE: Free T4: 1.04 ng/dL (ref 0.60–1.60)

## 2023-07-02 ENCOUNTER — Encounter: Payer: Self-pay | Admitting: Cardiovascular Disease

## 2023-07-02 ENCOUNTER — Encounter: Payer: Self-pay | Admitting: Endocrinology

## 2023-07-02 NOTE — Progress Notes (Unsigned)
Cardiology Clinic Note   Patient Name: Robert [name] Date of Encounter: 07/04/2023  Primary Care Provider:  Gaspar Garbe, MD Primary Cardiologist:  Nicki Guadalajara, MD  Patient Profile    62 year old with hx of acute on chronic diastolic CHF, CAD, s/p renal transplant, Aortic Valve replacement 2023, HTN, HL, PAF on Eliquis.  When last seen in the office on 06/26/2023 the patient had evidence of volume overload and had been followed by primary care NP who gave him Lasix 20 mg daily for 5 days prior to being seen.    He continued to have significant weakness, fatigue, volume overload, and shortness of breath.  I repeated his labs and he was found to have acute kidney injury with creatinine of 3.0.  He was called and advised to be admitted to the hospital.  He refused [facility] admission and chose to go to [facility] as his transplant physicians were located there.  Admitted 06/25/2023-06/29/2023 at [facility]. He was diagnosed with acute on chronic diastolic CHF, septic UTI, treated with antibiotics and IV Lasix.  Lasix was discontinued and he was started on torsemide 20 mg twice daily and spironolactone.  He was to continue chlorthalidone.  He will need follow-up labs and to follow-up with urologist locally in [place].  He was not recommended for GLP inhibitor due to chronic UTI and self-catheterization.   Past Medical History    Past Medical History:  Diagnosis Date   Acute cystitis without hematuria 11/09/2021   AKI (acute kidney injury) 08/08/2017   Elevated sCr to 2.5 (baseline ~1.7) noted at OSH - Subsequent transfer to [facility]  - Currently on IVMF in the setting of diarrhea and AKI - sCr at baseline at time of discharge   Anemia in chronic kidney disease 03/30/2015   Anticoagulated on Coumadin 11/09/2021   Aortic valve stenosis    Arthritis    Atrial fibrillation 11/29/2021   Benign essential hypertension 07/30/2014   prior to transplant pt took Metorpolol tartrate 50mg   qd on off HD days   Benign neoplasm of transverse colon    Cancer    skin ca right shoulder, plastic dsyplasia, pre-Ca polpys removed on Colonoscopy- 07/2014   Cerebrovascular accident (CVA) due to embolism of precerebral artery    Chronic renal failure    Colon polyps 07/23/2014   Tubular adenomas x 5   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   Diverticulosis of colon without hemorrhage    Eczema    ESRD (end stage renal disease)    hemodialysis 06/2014-02/2017, s/p living unrelated kidney transplant 02/26/17   Exertional dyspnea    Fatigue 01/08/2014   Fissure of skin 03/08/2020   GI bleed 07/26/2017   Headache    Heart murmur    aortic stenosis (moderate-severe 04/2021)   History of COVID-19 2023   History of unilateral nephrectomy 09/06/2013   Hydrocele, acquired 04/07/2017   Hyperlipidemia 09/06/2013   Hypertensive urgency 10/21/2021   Hyponatremia 11/09/2021   Hypothyroidism    Immunosuppressed status 02/26/2017   Induction agent: Solumedrol - Envarsus 3mg  daily  - Home dose decreased from 5mg  to 3mg  in the setting of FK 15.7 while inpatient   - Previously on Prograf, though d/c'ed during previous admission; suspected this may be causing hallucinations  -  Cellcept 1000mg  Q12H  - Prednisone 5mg  daily   Inguinal hernia without obstruction or gangrene 06/30/2017   Right inguinal wall defect appreciated by CT imaging [place] and on exam Dr. Roby Lofts today Dr. Roby Lofts has discussed hernia repair with mesh at some point in near future  Last Assessment & Plan:  Formatting of this note might be different from the original. Right inguinal wall defect appreciated by CT imaging [place] and on exam Dr. Roby Lofts today Dr. Roby Lofts has discussed hern   Kidney transplanted  02/26/2017   Date of Transplant = 02/26/17  ABO (Donor/Recipient) = compatible Blood type: O+/A+ DonorType: Living donor, unrelated Allograft type:Left kidney Donor anatomy: 2 arteries, 1 veins and single ureter  Pants-to-skirt anastomosis of dual renal arteries on the backtable. Donor Kidney Bx: Not applicable Allograft injury/complications: None Ne   Lower extremity edema 09/06/2013   Macular degeneration    Mild vascular neurocognitive disorder 02/08/2022   OAB (overactive bladder) 12/28/2019   Obstructive sleep apnea 09/06/2013   On Bi-PAP  Last Assessment & Plan:  Formatting of this note might be different from the original. - History of OSA, continue CPAP at night   Peritoneal dialysis catheter in place 02/24/2015   RLQ abdominal pain 04/19/2020   S/P AVR (aortic valve replacement) 10/05/2021   SBO (small bowel obstruction) 05/2015   Scrotal edema 03/14/2017   Testicular ultrasound did show hydrocele  Will refer to urology regarding ongoign swelling, hydrocele.   Last Assessment & Plan:  Formatting of this note might be different from the original.   Testicular ultrasound did show hydrocele  Will refer to urology regarding ongoign swelling, hydrocele.   Shortness of breath    with exertion   Stage 3b chronic kidney disease (CKD) 03/22/2014   SVT (supraventricular tachycardia) 09/06/2013   Tremor of both hands 06/29/2017   Last Assessment & Plan:  Present since transplant, stable and unchanged Query from tacrolimus -Improved tremors noted and verbalized by pt - Prograf doses titrated per FK levels  Last Assessment & Plan:  Formatting of this note might be different from the original. Present since transplant, stable and unchanged Query from tacrolimus -Improved tremors noted and verbalized by pt - Prograf doses titr   Type 2 diabetes mellitus with hyperglycemia, with long-term current use of insulin 07/14/2015   Umbilical hernia    s/p repair 04/28/15   Past Surgical History:  Procedure  Laterality Date   AORTIC VALVE REPLACEMENT N/A 10/05/2021   Procedure: AORTIC VALVE REPLACEMENT (AVR) WITH INSPIRIS RESILIA AORTIC VALVE SIZE ;  Surgeon: Alleen Borne, MD;  Location: [facility] OR;  Service: Open Heart Surgery;  Laterality: N/A;   AV FISTULA PLACEMENT Right 02/25/2015   Procedure: RIGHT ARTERIOVENOUS (AV) FISTULA CREATION;  Surgeon: Nada Libman, MD;  Location: [facility] OR;  Service: Vascular;  Laterality: Right;   AV FISTULA PLACEMENT Right 09/15/2015   Procedure: ARTERIOVENOUS (AV) FISTULA CREATION- RIGHT ARM;  Surgeon: Nada Libman, MD;  Location: [facility] OR;  Service: Vascular;  Laterality: Right;   CAPD INSERTION N/A 05/18/2014   Procedure: LAPAROSCOPIC INSERTION CONTINUOUS AMBULATORY PERITONEAL DIALYSIS  (CAPD) CATHETER;  Surgeon: Axel Filler, MD;  Location: [facility] OR;  Service: General;  Laterality: N/A;   COLONOSCOPY     COLONOSCOPY WITH PROPOFOL N/A 07/12/2016   Procedure: COLONOSCOPY WITH PROPOFOL;  Surgeon: Rachael Fee, MD;  Location: [facility];  Service: Endoscopy;  Laterality: N/A;   CORONARY ARTERY BYPASS GRAFT N/A 10/05/2021   Procedure: CORONARY ARTERY BYPASS GRAFTING (  CABG) X 1, ON PUMP, USING LEFT INTERNAL MAMMARY ARTERY CONDUIT;  Surgeon: Alleen Borne, MD;  Location: [facility] OR;  Service: Open Heart Surgery;  Laterality: N/A;   declotting of fistula  08/2015   ESOPHAGOGASTRODUODENOSCOPY (EGD) WITH PROPOFOL N/A 07/12/2016   Procedure: ESOPHAGOGASTRODUODENOSCOPY (EGD) WITH PROPOFOL;  Surgeon: Rachael Fee, MD;  Location: [facility];  Service: Endoscopy;  Laterality: N/A;   FISTULA SUPERFICIALIZATION Right 02/07/2016   Procedure: RIIGHT UPPER ARM FISTULA SUPERFICIALIZATION;  Surgeon: Sherren Kerns, MD;  Location: [facility] OR;  Service: Vascular;  Laterality: Right;   IJ catheter insertion     INSERTION OF DIALYSIS CATHETER Right 02/25/2015   Procedure: INSERTION OF RIGHT INTERNAL JUGULAR DIALYSIS CATHETER;  Surgeon: Nada Libman, MD;  Location: [facility] OR;  Service:  Vascular;  Laterality: Right;   KIDNEY TRANSPLANT     LAPAROSCOPIC REPOSITIONING CAPD CATHETER N/A 06/16/2014   Procedure: LAPAROSCOPIC REPOSITIONING CAPD CATHETER;  Surgeon: Axel Filler, MD;  Location: [facility] OR;  Service: General;  Laterality: N/A;   LAPAROSCOPY N/A 05/04/2015   Procedure: LAPAROSCOPY DIAGNOSTIC LYSIS OF ADHESIONS;  Surgeon: Abigail Miyamoto, MD;  Location: [facility] OR;  Service: General;  Laterality: N/A;   MINOR REMOVAL OF PERITONEAL DIALYSIS CATHETER N/A 04/28/2015   Procedure:  REMOVAL OF PERITONEAL DIALYSIS CATHETER;  Surgeon: Abigail Miyamoto, MD;  Location[facility] OR;  Service: General;  Laterality: N/A;   NEPHRECTOMY Left 1974   RENAL BIOPSY Right 2012   REVISON OF ARTERIOVENOUS FISTULA Right 05/26/2015   Procedure: SUPERFICIALIZATION OF ARTERIOVENOUS FISTULA WITH SIDE BRANCH LIGATIONS;  Surgeon: Nada Libman, MD;  Location: [facility] OR;  Service: Vascular;  Laterality: Right;   RIGHT/LEFT HEART CATH AND CORONARY ANGIOGRAPHY N/A 08/22/2021   Procedure: RIGHT/LEFT HEART CATH AND CORONARY ANGIOGRAPHY;  Surgeon: Lennette Bihari, MD;  [facility] INV[facility];  Service: Cardiovascular;  Laterality: N/A;   SKIN CANCER EXCISION     right shoulder   SVT ABLATION N/A 11/23/2016   Procedure: SVT Ablation;  Surgeon: Will Jorja Loa, MD;  [facility]VAS[facility];  Service: Cardiovascular;  Laterality: N/A;   TEE WITHOUT CARDIOVERSION N/A 10/05/2021   Procedure: TRANSESOPHAGEAL ECHOCARDIOGRAM (TEE);  Surgeon: Alleen Borne, MD;  Location: [facility] OR;  Service: Open Heart Surgery;  Laterality: N/A;   UMBILICAL HERNIA REPAIR N/A 05/18/2014   Procedure: HERNIA REPAIR UMBILICAL ADULT;  Surgeon: Axel Filler, MD;  Location: [facility] OR;  Service: General;  Laterality: N/A;   UMBILICAL HERNIA REPAIR N/A 04/28/2015   Procedure: UMBILICAL HERNIA REPAIR WITH MESH;  Surgeon: Abigail Miyamoto, MD;  Location: [facility] OR;  Service: General;  Laterality: N/A;    Allergies  No Known Allergies  History of  Present Illness    Mr. Wamser returns today status post hospitalization at [facility] with history of renal transplant, UTI,  acute on chronic diastolic heart failure.  He self catheterizes and was treated for significant bacteremia.  He was diuresed with IV Lasix.  Creatinine prior to admission was over 3.0.  He was advised admission when being seen last.  He refused [place] admission choosing to go to [facility].  He was treated for 4 days and released after being fully diuresed.  Echocardiogram was completed revealing EF of greater than 60% with increased RV size.  Severe LVH with indeterminate diastolic function in the setting of atrial fibrillation.  He was taken off of Lasix and started on torsemide and spironolactone.  Discharge weight 290 to 295 pounds.  He will need a follow-up BMET today along with proBNP and CBC.  Discharge creatinine 2.6.  He was seen by infectious diseases who did not recommend antibiotic therapy despite Klebsiella found in UA.  They also felt he had a low concern for systemic infection, since his symptoms resolved with hydration did not feel that any additional antibiotic therapy would be helpful in the setting of minimal symptoms.   He comes today tired but breathing better, denies any volume overload symptoms, he has chronic dyspnea on exertion but not severe currently.  His weight has been maintained as he is now 20 pounds lighter since being seen last.  He is tolerating changed to torsemide and addition of spironolactone with discontinuation of Lasix.  Home Medications    Current Outpatient Medications  Medication Sig Dispense Refill   acetaminophen (TYLENOL) 325 MG tablet Take 2 tablets (650 mg total) by mouth every 4 (four) hours as needed for moderate pain.     albuterol (VENTOLIN HFA) 108 (90 Base) MCG/ACT inhaler Inhale 1 puff into the lungs. Every hour as needed     amLODipine (NORVASC) 5 MG tablet TAKE 1 AND 1/2 TABLETS  DAILY BY MOUTH 135 tablet 3   apixaban (ELIQUIS) 5 MG TABS tablet Take 1 tablet (5 mg total) by mouth 2 (two) times daily. 180 tablet 3   B-D UF III MINI PEN NEEDLES 31G X 5 MM MISC USE TO INJECT INSULIN 4 TIMES A DAY 360 each 2   belatacept (NULOJIX) 250 MG SOLR injection Inject 24.5 mLs into the vein every 28 (twenty-eight) days.      carvedilol (COREG) 25 MG tablet Take 1 tablet (25 mg total) by mouth 2 (two) times daily with a meal.     chlorthalidone (HYGROTON) 25 MG tablet Take 1 tablet by mouth daily.     Continuous Glucose Sensor (FREESTYLE LIBRE 3 SENSOR) MISC SMARTSIG:1 TOPICAL EVERY 2 WEEKS 6 each 2   doxazosin (CARDURA) 2 MG tablet Take 0.5 tablets (1 mg total) by mouth daily. Take 0.5 Tablet Daily 90 tablet 0   DULoxetine HCl 20 MG CSDR Take 30 mg by mouth daily.     gabapentin (NEURONTIN) 100 MG capsule Take 200-300 mg by mouth See admin instructions. Taking 200 mg in the AM and 300 mg in the evening     hydrALAZINE (APRESOLINE) 100 MG tablet TAKE 1 TABLET BY MOUTH EVERY 8 HOURS 90 tablet 3   insulin aspart (NOVOLOG) 100 UNIT/ML injection Inject 5-20 Units into the skin daily as needed for high blood sugar.     insulin regular human CONCENTRATED (HUMULIN R) 500 UNIT/ML injection Inject 125 Units into the skin every 3 (three) days. Omnipod     levothyroxine (SYNTHROID) 112 MCG tablet TAKE 1 TABLET BY MOUTH EVERY DAY 90 tablet 3   mycophenolate (CELLCEPT) 250 MG capsule Take 500 mg by mouth every 12 (twelve) hours.     NEEDLE, DISP, 18 G (BD HYPODERMIC NEEDLE) 18G X 1" MISC USE AS DIRECTED TO INJECT 0.6MLS OF TESTOSTERONE EVERY 7 DAYS (DRAW UP) 100 each 1   nitroGLYCERIN (NITROSTAT) 0.4 MG SL tablet Place 0.4 mg under the tongue every 5 (five) minutes as needed for chest pain.     NON FORMULARY CPAP/BIPAP  at bedtime  BIPAP per pt     potassium chloride SA (KLOR-CON M20) 20 MEQ tablet Take half a tablet , Potassium 10 meq, for 3 days with 20 mg Lasix as prescribed.  Then call office  for new  instructions. (Patient taking differently: 20 mEq 2 (two) times daily. Take 1 tablet TWICE DAILY) 90 tablet 3   predniSONE (DELTASONE) 5 MG tablet Take 5 mg by mouth daily with breakfast. Continuously     rosuvastatin (CRESTOR) 20 MG tablet TAKE 1 TABLET BY MOUTH EVERY DAY 90 tablet 0   senna-docusate (SENOKOT-S) 8.6-50 MG tablet Take 1 tablet by mouth at bedtime as needed for mild constipation. (Patient taking differently: Take 1 tablet by mouth every other day.)     Spironolactone 25 MG/5ML SUSP Take 25 mg by mouth daily.     Syringe, Disposable, 1 ML MISC Use to draw up 0.31ml of testosterone medication 25 each 0   SYRINGE-NEEDLE, DISP, 3 ML (BD PLASTIPAK SYRINGE) 21G X 1" 3 ML MISC USE AS DIRECTED TO INJECT 0.6MLS OF TESTOSTERONE EVERY 7 DAYS (INJECT) 100 each 1   tamsulosin (FLOMAX) 0.4 MG CAPS capsule Take 0.8 mg by mouth daily after supper.     Testosterone Cypionate 200 MG/ML SOLN Inject 120 mg as directed once a week. Saturday     torsemide (DEMADEX) 20 MG tablet Take 20 mg by mouth 2 (two) times daily.     traMADol (ULTRAM) 50 MG tablet 1 tablet Oral Once a day for 90 days     traZODone (DESYREL) 50 MG tablet 1 tablet at bedtime as needed Orally Once a day (Do not take if take ambien)     zolpidem (AMBIEN) 10 MG tablet Take 10 mg by mouth at bedtime as needed for sleep.     zonisamide (ZONEGRAN) 25 MG capsule TAKE 3 CAPSULES (75 MG TOTAL) BY MOUTH DAILY 270 capsule 1   No current facility-administered medications for this visit.     Family History    Family History  Problem Relation Age of Onset   Colon polyps Father    Stroke Father    Dementia Father        vascular   Colon cancer Neg Hx    Stomach cancer Neg Hx    He indicated that his mother is deceased. He indicated that his father is deceased. He indicated that all of his three sisters are alive. He indicated that both of his brothers are alive. He indicated that his maternal grandmother is deceased. He indicated  that his maternal grandfather is deceased. He indicated that his paternal grandmother is deceased. He indicated that his paternal grandfather is deceased. He indicated that the status of his neg hx is unknown.  Social History    Social History   Socioeconomic History   Marital status: Married    Spouse name: Not on file   Number of children: 3   Years of education: 16   Highest education level: Bachelor's degree (e.g., BA, AB, BS)  Occupational History   Occupation: English as a second language teacher: [employer]  Tobacco Use   Smoking status: Former    Types: Cigars    Quit date: 12/06/2012    Years since quitting: 10.5   Smokeless tobacco: Never  Vaping Use   Vaping status: Never Used  Substance and Sexual Activity   Alcohol use: Not Currently   Drug use: No   Sexual activity: Not on file  Other Topics Concern   Not on file  Social History Narrative   Left handed    Lives with wife   Social Determinants of Health   Financial Resource Strain: Low Risk  (06/26/2023)   Received from [facility]   Overall Financial Resource Strain (  CARDIA)    Difficulty of Paying Living Expenses: Not hard at all  Food Insecurity: No Food Insecurity (06/26/2023)   Received from [facility]   Hunger Vital Sign    Worried About Running Out of Food in the Last Year: Never true    Ran Out of Food in the Last Year: Never true  Transportation Needs: No Transportation Needs (06/26/2023)   Received from [facility] - Transportation    In the past 12 months, has lack of transportation kept you from medical appointments or from getting medications?: No    Lack of Transportation (Non-Medical): No  Physical Activity: Not on file  Stress: Not on file  Social Connections: Unknown (02/17/2022)   Received from [facility]   Social Network    Social Network: Not on file  Intimate Partner Violence: Unknown (01/09/2022)   Received from [facility]   HITS    Physically Hurt: Not on file    Insult or Talk Down To: Not on file    Threaten Physical Harm: Not on file    Scream or Curse: Not on file     Review of Systems    General:  No chills, fever, night sweats or weight changes.  Cardiovascular:  No chest pain, dyspnea on exertion, edema, orthopnea, palpitations, positive for paroxysmal nocturnal dyspnea sleeps in recliner. Dermatological: No rash, lesions/masses Respiratory: No cough, dyspnea Urologic: No hematuria, dysuria Abdominal:   No nausea, vomiting, diarrhea, bright red blood per rectum, melena, or hematemesis Neurologic:  No visual changes, wkns, changes in mental status. All other systems reviewed and are otherwise negative except as noted above.  EKG Interpretation Date/Time:  Thursday July 04 2023 10:38:56 EDT Ventricular Rate:  93 PR Interval:    QRS Duration:  94 QT Interval:  390 QTC Calculation: 484 R Axis:   -8  Text Interpretation: Atrial fibrillation T wave abnormality, consider lateral ischemia When compared with ECG of 24-Jun-2023 14:23, No significant change was found Confirmed by Joni Reining [phone]) on 07/04/2023 12:50:52 PM    Physical Exam    VS:  Ht 5\' 11"  (1.803 m)   Wt 288 lb (130.6 kg)   SpO2 93%   BMI 40.17 kg/m  , BMI Body mass index is 40.17 kg/m.     GEN: Well nourished, well developed, in no acute distress.  Obese HEENT: normal. Neck: Supple, no JVD, carotid bruits, or masses. Cardiac: IRRR, soft 2/6 systolic murmurs, rubs, or gallops. No clubbing, cyanosis, edema.  Radials/DP/PT 2+ and equal bilaterally.  Respiratory:  Respirations regular and unlabored, clear to auscultation bilaterally. GI: Soft, nontender, nondistended, BS + x 4. MS: no deformity or atrophy. Skin: warm and dry, no rash. Neuro:  Strength and sensation are intact. Psych: Normal affect.  EKG Interpretation Date/Time:  Thursday July 04 2023 10:38:56 EDT Ventricular Rate:   93 PR Interval:    QRS Duration:  94 QT Interval:  390 QTC Calculation: 484 R Axis:   -8  Text Interpretation: Atrial fibrillation T wave abnormality, consider lateral ischemia When compared with ECG of 24-Jun-2023 14:23, No significant change was found Confirmed by Joni Reining [phone]) on 07/04/2023 12:50:52 PM   Lab Results  Component Value Date   WBC 11.8 (H) 06/24/2023   HGB 13.3 06/24/2023   HCT 42.7 06/24/2023   MCV 86 06/24/2023   PLT 201 06/24/2023   Lab Results  Component Value Date   CREATININE 2.47 (H) 07/01/2023  BUN 43 (H) 07/01/2023   NA 140 07/01/2023   K 4.0 07/01/2023   CL 104 07/01/2023   CO2 24 07/01/2023   Lab Results  Component Value Date   ALT 13 10/15/2022   AST 19 10/15/2022   ALKPHOS 50 10/15/2022   BILITOT 1.0 10/15/2022   Lab Results  Component Value Date   CHOL 116 05/02/2022   HDL 52 05/02/2022   LDLCALC 55 05/02/2022   TRIG 45 05/02/2022   CHOLHDL 2.2 05/02/2022    Lab Results  Component Value Date   HGBA1C 7.9 (H) 07/01/2023     Review of Prior Studies EKG [facility] Atrial fibrillation  Ventricular premature complex  Nonspecific ST depression  Nonspecific T wave abnormalities  Abnormal ECG   When compared with ECG of 22-Jun-2019 10:28,  Atrial fibrillation has replaced Sinus rhythm  Nonspecific ST depression is now present  Nonspecific T wave abnormalities are now present   Echocardiogram 06/26/2023 [facility] (Care Everywhere download).  EKG Interpretation Date/Time:  Thursday July 04 2023 10:38:56 EDT Ventricular Rate:  93 PR Interval:    QRS Duration:  94 QT Interval:  390 QTC Calculation: 484 R Axis:   -8  Text Interpretation: Atrial fibrillation T wave abnormality, consider lateral ischemia When compared with ECG of 24-Jun-2023 14:23, No significant change was found Confirmed by Joni Reining [phone]) on 07/04/2023 12:50:52 PM  ECHOCARDIOGRAPHIC MEASUREMENTS -----------------------------------------------  2D  DIMENSIONS  AORTA          Values     Normal RangeMAIN PA      Values     Normal Range       Annulus:  nm*  cm    [2 - 3.2]      PA Main:   1.9 cm    [1.5 - 2.1]     Aorta Sin:  nm*  cm    [2.8 - 4]   RIGHT VENTRICLE   ST Junction:  nm*  cm    [2.3 - 3.5]    RV Base:   5.3 cm    [2.5 - 4.1]     Asc.Aorta:   3.8 cm    [2.2 - 3.8]     RV Mid:   4.2 cm    [1.9 - 3.5]  LEFT VENTRICLE                         RV Length:  nm*  cm    [  ]         LVIDd:   6.2 cm    [4.2 - 5.8] RIGHT ATRIUM         LVIDs:   3.0 cm    [2.4 - 4]      RA Area:  17   cm2   [ <= 20]        LVEDVi:  32.0 ml/m2 [34 - 74]         RAVi:  16   ml/m2 [11 - 39]        LVESVi:  14.0 ml/m2 [11 - 31]   INFERIOR VENA CAVA            FS:  52   %     [ >= 25]       Max.IVC:   3.0 cm    [ <= 2.1]           SWT:   1.3 cm    [0.6 - 1]  Min.IVC:  nm*  cm    [ <= 1.7]           PWT:   0.9 cm    [0.6 - 1]   __________________  LEFT ATRIUM                           nm* - not measured       LA Diam:   4.8 cm    [3 - 4]       LA Area:  34   cm2   [ <= 20]     LA Volume: 117   mL    [18 - 58]          LAVi:  46   ml/m2 [16 - 34]   ECHOCARDIOGRAPHIC DESCRIPTIONS -----------------------------------------------  AORTIC ROOT          Size: Normal    Dissection: INDETERM FOR DISSECTION   AORTIC VALVE      Leaflets: BIOPROSTHETIC         Morphology: Normal      Mobility: Fully Mobile   LEFT VENTRICLE                                      Anterior: Normal          Size: MILDLY ENLARGED                        Lateral: Normal   Contraction: Normal                                  Septal: Normal    Closest EF: >55%(Estimated)  Calc.EF: 55% (3D)      Apical: Normal     LV masses: No Masses                             Inferior: Normal           LVH: MILD LVH ASYMMETRIC                  Posterior: Normal   LV GLS(GE): -14.0% Normal Range [ <= -16]  Dias.FxClass: N/A   MITRAL VALVE      Leaflets: ABNORMAL                Mobility: Fully  mobile    Morphology: THICKENED LEAFLET(S)       MV Note: MAC   LEFT ATRIUM          Size: MODERATELY ENLARGED     LA masses: No masses                Normal IAS   MAIN PA          Size: Normal   PULMONIC VALVE    Morphology: Normal      Mobility: Fully Mobile   RIGHT VENTRICLE          Size: MODERATELY ENLARGED       Free wall: Normal   Contraction: Normal                    RV masses: No Masses         TAPSE:   2.4 cm,  Normal Range [>=  1.6 cm]   TRICUSPID VALVE      Leaflets: Normal                  Mobility: Fully mobile    Morphology: Normal   RIGHT ATRIUM          Size: Normal                     RA Other: None     RA masses: No masses   PERICARDIUM        Fluid: No effusion     Peri Note:  PROMINENT FAT PAD NOTED   INFERIOR VENACAVA          Size: DILATED    Normal respiratory collapse   DOPPLER ECHO and OTHER SPECIAL PROCEDURES ------------------------------------     Aortic: No AR                  BIOPROSTHETIC AoV      2.3 m/s peak vel   20 mmHg peak grad  13 mmHg mean grad 2.1 cm2 by DOPPLER   LVOT Diam: 2.2 cm. Resting LVOT Vel: 1.3 m/s. Dimensionless Index: 0.56      Mitral: TRIVIAL MR             No MS     MV Inflow E Vel.= nm* cm/s  MV Annulus E'Vel.= nm* cm/s  E/E'Ratio= nm*   Tricuspid: MILD TR                No TS             2.4 m/s peak TR vel   32 mmHg peak RV pressure   Pulmonary: No PR                  No PS       Other:   INTERPRETATION ---------------------------------------------------------------    NORMAL LEFT VENTRICULAR SYSTOLIC FUNCTION WITH MILD LVH    NORMAL RIGHT VENTRICULAR SYSTOLIC FUNCTION    VALVULAR REGURGITATION: TRIVIAL MR, MILD TR    PROSTHETIC VALVE(S): BIOPROSTHETIC AoV    3D acquisition and reconstructions were performed as part of this    examination to more accurately quantify the effects of identified    structural abnormalities as part of the exam. (post-processing on an    Independent workstation).      Compared with prior Echo study on 08/27/2014: RV LARGER    Assessment & Plan   1.  Chronic diastolic heart failure: Patient is recently been discharged from [facility] with volume overload.  He was diuresed with IV Lasix and has lost approximately 20 pounds.  He is feeling some better and breathing better.  Weight has been maintained.  Goal weight is 290 to 295 pounds, he states that they are requesting that he get down to about 280 to 285 pounds.  He has been changed from p.o. Lasix to p.o. torsemide and spironolactone.  Will repeat proBNP, c-Met, and CBC today as recommended by [facility] physician on discharge.  He will follow-up with Dr. Tresa Endo on previously scheduled appointment.  He will call us if cyst becomes symptomatic but continue current regimen at this time.  2.  Chronic atrial fibrillation: Tachycardic today.  Remains on Eliquis.  No evidence of bleeding during self-catheterization.  Continue carvedilol 25 mg twice daily with meals.  Heart rate controlled should be better and will follow-up on this to avoid exacerbation of CHF.  3.  Klebsiella UTI: He was to follow-up with urology and will make an appointment sooner than planned appointment in January 2025, status post hospitalization.  4.  AKI: Initially creatinine greater than 3.0, on discharge 2.6.  He has follow-up with nephrology as he has a kidney transplant and will continue to follow at [facility] for this.      5.  Hypertension: Blood pressure is well-controlled today.  No change in medication regimen.  Follow-up labs are being drawn today.  Copies of his discharge summary from [facility] were provided to the patient to take to PCP and urologist.    Signed, Bettey Mare. Liborio Nixon, ANP, AACC   07/04/2023 12:51 PM      Office [phone] Fax [phone]  Notice: This dictation was prepared with Dragon dictation along with smaller phrase technology. Any transcriptional errors that result  from this process are unintentional and may not be corrected upon review.

## 2023-07-04 ENCOUNTER — Ambulatory Visit: Payer: 59 | Attending: Adult Health | Admitting: Adult Health

## 2023-07-04 ENCOUNTER — Ambulatory Visit: Payer: 59 | Admitting: Endocrinology

## 2023-07-04 ENCOUNTER — Encounter: Payer: Self-pay | Admitting: Adult Health

## 2023-07-04 VITALS — Ht 71.0 in | Wt 288.0 lb

## 2023-07-04 DIAGNOSIS — G4733 Obstructive sleep apnea (adult) (pediatric): Secondary | ICD-10-CM | POA: Diagnosis not present

## 2023-07-04 DIAGNOSIS — I5032 Chronic diastolic (congestive) heart failure: Secondary | ICD-10-CM

## 2023-07-04 DIAGNOSIS — I10 Essential (primary) hypertension: Secondary | ICD-10-CM

## 2023-07-04 DIAGNOSIS — I480 Paroxysmal atrial fibrillation: Secondary | ICD-10-CM | POA: Diagnosis not present

## 2023-07-04 DIAGNOSIS — Z952 Presence of prosthetic heart valve: Secondary | ICD-10-CM

## 2023-07-04 LAB — CBC

## 2023-07-04 LAB — PRO B NATRIURETIC PEPTIDE

## 2023-07-04 LAB — BASIC METABOLIC PANEL: Calcium: 9.5 mg/dL (ref 8.6–10.2)

## 2023-07-04 NOTE — Patient Instructions (Addendum)
Medication Instructions:  NO CHANGES   Lab Work: PRO BNP BMET CBC    Testing/Procedures: NONE   Follow-Up: At [facility], you and your health needs are our priority.  As part of our continuing mission to provide you with exceptional heart care, we have created designated Provider Care Teams.  These Care Teams include your primary Cardiologist (physician) and Advanced Practice Providers (APPs -  Physician Assistants and Nurse Practitioners) who all work together to provide you with the care you need, when you need it.    Your next appointment:   KEEP SCHEDULED APPOINTMENT WITH DR. Tresa Endo IN NOVEMBER   Provider:   Nicki Guadalajara, MD

## 2023-07-05 LAB — BASIC METABOLIC PANEL
BUN/Creatinine Ratio: 17 (ref 10–24)
BUN: 51 mg/dL — ABNORMAL HIGH (ref 8–27)
CO2: 25 mmol/L (ref 20–29)
Calcium: 9.5 mg/dL (ref 8.6–10.2)
Chloride: 98 mmol/L (ref 96–106)
Creatinine, Ser: 2.97 mg/dL — ABNORMAL HIGH (ref 0.76–1.27)
Potassium: 4.4 mmol/L (ref 3.5–5.2)
Sodium: 138 mmol/L (ref 134–144)
eGFR: 23 mL/min/{1.73_m2} — ABNORMAL LOW (ref >59)

## 2023-07-08 ENCOUNTER — Telehealth: Payer: Self-pay | Admitting: Cardiovascular Disease

## 2023-07-08 ENCOUNTER — Telehealth: Payer: Self-pay

## 2023-07-08 DIAGNOSIS — R600 Localized edema: Secondary | ICD-10-CM

## 2023-07-08 NOTE — Telephone Encounter (Signed)
Patient is returning LPN's call for results.  Please advise.

## 2023-07-08 NOTE — Telephone Encounter (Signed)
See previous encounter.  Patient is requesting a call back to review results again.

## 2023-07-08 NOTE — Telephone Encounter (Addendum)
Called patient regarding results. Patient had understanding of results. Patient advised to repeat Blood test in 5 Days.----- Message from Joni Reining sent at 07/05/2023  7:13 AM EDT ----- He is still volume overloaded. Increase the torsemide to 40 mg BID for 7 days and then 40 mg in the am and 20 mg in the pm.  He will need a repeat BMET and Pro-BNP in 5 days.   KL

## 2023-07-08 NOTE — Telephone Encounter (Signed)
Left voicemail to return call to office.

## 2023-07-09 ENCOUNTER — Ambulatory Visit: Payer: 59 | Admitting: Endocrinology

## 2023-07-09 ENCOUNTER — Encounter: Payer: Self-pay | Admitting: Endocrinology

## 2023-07-09 VITALS — BP 120/60 | HR 74 | Ht 71.0 in | Wt 294.0 lb

## 2023-07-09 DIAGNOSIS — E039 Hypothyroidism, unspecified: Secondary | ICD-10-CM | POA: Diagnosis not present

## 2023-07-09 DIAGNOSIS — E291 Testicular hypofunction: Secondary | ICD-10-CM

## 2023-07-09 DIAGNOSIS — E1165 Type 2 diabetes mellitus with hyperglycemia: Secondary | ICD-10-CM

## 2023-07-09 DIAGNOSIS — Z794 Long term (current) use of insulin: Secondary | ICD-10-CM | POA: Diagnosis not present

## 2023-07-09 NOTE — Progress Notes (Unsigned)
Outpatient Endocrinology Note [name], MD  07/10/23  Patient's Name: Robert [name]    DOB: 1961/04/10    MRN: [id number]                                                    REASON OF VISIT: Follow up for type 2 diabetes mellitus  PCP: [name], Adelfa Koh, MD  HISTORY OF PRESENT ILLNESS:   [name] is a 62 y.o. old male with past medical history listed below, is here for follow up of type 2 diabetes mellitus.   Pertinent Diabetes History: Patient was diagnosed with type 2 diabetes mellitus in 2012.  He was initially treated with oral hypoglycemic drugs like glyburide but could not take metformin because of renal dysfunction.  He had been on Victoza and later restarted on insulin Lantus and then U-500 insulin.  Later started on insulin pump, OmniPod with U-500 insulin.  Chronic Diabetes Complications : Retinopathy: no. Last ophthalmology exam was done on annually reportedly. Nephropathy: yes, CKD IV, ESRD s/p renal transplant in 2018.  Peripheral neuropathy: yes, on gabapentin , following with podiatry regularly. Coronary artery disease: no Stroke: no  Relevant comorbidities and cardiovascular risk factors: Obesity: yes Body mass index is 41 kg/m.  Hypertension: yes Hyperlipidemia. Yes, on statin.   Current / Home Diabetic regimen includes:  OmniPod Dash with U-500 insulin  Insulin Pump setting:  Basal ( total 21.45) MN- 0.15u/hour 6AM- 1.65  11AM- 1.7 5PM- 0.3  Bolus CHO Ratio (1unit:CHO) MN- 1:15  Correction/Sensitivity: MN- 1:50  Target: 130   Active insulin time: 6 hours  Has not been on the OmniPod 5 because of the cost of the Dexcom  Prior diabetic medications: Tried Ozempic could not tolerate.  CONTINUOUS GLUCOSE MONITORING SYSTEM (CGMS) / INSULIN PUMP INTERPRETATION:                         Omnipod DASH Pump & Sensor Download (Reviewed and summarized below.)  Average total daily insulin:  21.2 units, Basal: 90%%, Bolus:  10%.  Glycemic data:    CONTINUOUS GLUCOSE MONITORING SYSTEM (CGMS) INTERPRETATION: At today's visit, we reviewed CGM downloads. The full report is scanned in the media.  FreeStyle Libre 3 CGM-  Sensor Download (Sensor download was reviewed and summarized below.) Dates: September 18 to July 09, 2023 14 days Sensor Average: 180  Glucose Management Indicator: 7.6% Glucose Variability: %  % data captured: 94%  Glycemic Trends:  <54: 0% 54-70: 3% 71-180: 51% 181-250: 30% 251-400: 16%   Interpretation: -Patient has hyperglycemia with blood sugar up to 300s especially with supper and some time with lunch and rarely with breakfast related with not enough bolus or skipping meal bolus.  He had occasional hypoglycemia around late afternoon before supper on September 25 with blood sugar in 50s, some of the other times he had low blood sugar in the early morning.  Most of the blood sugar overnight acceptable / trending down and most of the blood sugar in between the meals are acceptable.  Hypoglycemia: Patient has  hypoglycemic episodes. Patient has hypoglycemia awareness.    Factors modifying glucose control: 1.  Diabetic diet assessment: 2-3 meals/ day, snacks at times.   2.  Staying active or exercising: no  3.  Medication compliance: compliant most of  the time.  # Hypothyroidism : Longstanding hypothyroidism has been on thyroid hormone replacement levothyroxine every morning, taking 112 mcg daily.  # Hypogonadism : He has hypogonadotropic hypogonadism related to his ? metabolic syndrome.  When testosterone level was low he was having fatigue and decreased motivation and was prescribed with testosterone supplement.  He was initially on AndroGel since February 2019 which was subsequently stopped due to cost.  He is currently on Depo-Testosterone cypionate 120 mg weekly since February 2023.  He does feel better when taking testosterone injections.  Pretreatment free testosterone level  was low at 3.7.  Interval history 07/10/23 Patient reports he was hospitalized at [facility] about 2 weeks ago. He is currently taking levothyroxine 100 mcg daily.  Recent thyroid lab normal.  No hypo and hyperthyroid symptoms.  Recent labs reviewed with normal thyroid function test and normal testosterone level.  Hemoglobin A1c worsening 7.9%.   Latest Reference Range & Units 07/01/23 11:13  Glucose 70 - 99 mg/dL 161 (H)  Hemoglobin W9U 4.6 - 6.5 % 7.9 (H)  Testosterone 300.00 - 890.00 ng/dL 045.40  TSH 9.81 - 1.91 uIU/mL 2.04  T4,Free(Direct) 0.60 - 1.60 ng/dL 4.78  (H): Data is abnormally high  REVIEW OF SYSTEMS As per history of present illness.   PAST MEDICAL HISTORY: Past Medical History:  Diagnosis Date   Acute cystitis without hematuria 11/09/2021   AKI (acute kidney injury) 08/08/2017   Elevated sCr to 2.5 (baseline ~1.7) noted at OSH - Subsequent transfer to [facility]  - Currently on IVMF in the setting of diarrhea and AKI - sCr at baseline at time of discharge   Anemia in chronic kidney disease 03/30/2015   Anticoagulated on Coumadin 11/09/2021   Aortic valve stenosis    Arthritis    Atrial fibrillation 11/29/2021   Benign essential hypertension 07/30/2014   prior to transplant pt took Metorpolol tartrate 50mg  qd on off HD days   Benign neoplasm of transverse colon    Cancer    skin ca right shoulder, plastic dsyplasia, pre-Ca polpys removed on Colonoscopy- 07/2014   Cerebrovascular accident (CVA) due to embolism of precerebral artery    Chronic renal failure    Colon polyps 07/23/2014   Tubular adenomas x 5   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   Diverticulosis of colon without hemorrhage    Eczema    ESRD (end stage renal disease)     hemodialysis 06/2014-02/2017, s/p living unrelated kidney transplant 02/26/17   Exertional dyspnea    Fatigue 01/08/2014   Fissure of skin 03/08/2020   GI bleed 07/26/2017   Headache    Heart murmur    aortic stenosis (moderate-severe 04/2021)   History of COVID-19 2023   History of unilateral nephrectomy 09/06/2013   Hydrocele, acquired 04/07/2017   Hyperlipidemia 09/06/2013   Hypertensive urgency 10/21/2021   Hyponatremia 11/09/2021   Hypothyroidism    Immunosuppressed status 02/26/2017   Induction agent: Solumedrol - Envarsus 3mg  daily  - Home dose decreased from 5mg  to 3mg  in the setting of FK 15.7 while inpatient   - Previously on Prograf, though d/c'ed during previous admission; suspected this may be causing hallucinations  - Cellcept 1000mg  Q12H  - Prednisone 5mg  daily   Inguinal hernia without obstruction or gangrene 06/30/2017  Right inguinal wall defect appreciated by CT imaging [place] and on exam Dr. Roby Lofts today Dr. Roby Lofts has discussed hernia repair with mesh at some point in near future  Last Assessment & Plan:  Formatting of this note might be different from the original. Right inguinal wall defect appreciated by CT imaging [place] and on exam Dr. Roby Lofts today Dr. Roby Lofts has discussed hern   Kidney transplanted 02/26/2017   Date of Transplant = 02/26/17  ABO (Donor/Recipient) = compatible Blood type: O+/A+ DonorType: Living donor, unrelated Allograft type:Left kidney Donor anatomy: 2 arteries, 1 veins and single ureter  Pants-to-skirt anastomosis of dual renal arteries on the backtable. Donor Kidney Bx: Not applicable Allograft injury/complications: None Ne   Lower extremity edema 09/06/2013   Macular degeneration    Mild vascular neurocognitive disorder 02/08/2022   OAB (overactive bladder) 12/28/2019   Obstructive sleep apnea 09/06/2013   On Bi-PAP  Last Assessment & Plan:  Formatting of this note might be different from the original. - History of OSA, continue CPAP at night    Peritoneal dialysis catheter in place 02/24/2015   RLQ abdominal pain 04/19/2020   S/P AVR (aortic valve replacement) 10/05/2021   SBO (small bowel obstruction) 05/2015   Scrotal edema 03/14/2017   Testicular ultrasound did show hydrocele  Will refer to urology regarding ongoign swelling, hydrocele.   Last Assessment & Plan:  Formatting of this note might be different from the original.   Testicular ultrasound did show hydrocele  Will refer to urology regarding ongoign swelling, hydrocele.   Shortness of breath    with exertion   Stage 3b chronic kidney disease (CKD) 03/22/2014   SVT (supraventricular tachycardia) 09/06/2013   Tremor of both hands 06/29/2017   Last Assessment & Plan:  Present since transplant, stable and unchanged Query from tacrolimus -Improved tremors noted and verbalized by pt - Prograf doses titrated per FK levels  Last Assessment & Plan:  Formatting of this note might be different from the original. Present since transplant, stable and unchanged Query from tacrolimus -Improved tremors noted and verbalized by pt - Prograf doses titr   Type 2 diabetes mellitus with hyperglycemia, with long-term current use of insulin 07/14/2015   Umbilical hernia    s/p repair 04/28/15    PAST SURGICAL HISTORY: Past Surgical History:  Procedure Laterality Date   AORTIC VALVE REPLACEMENT N/A 10/05/2021   Procedure: AORTIC VALVE REPLACEMENT (AVR) WITH INSPIRIS RESILIA AORTIC VALVE SIZE ;  Surgeon: Alleen Borne, MD;  Location: [facility] OR;  Service: Open Heart Surgery;  Laterality: N/A;   AV FISTULA PLACEMENT Right 02/25/2015   Procedure: RIGHT ARTERIOVENOUS (AV) FISTULA CREATION;  Surgeon: Nada Libman, MD;  Location: [facility] OR;  Service: Vascular;  Laterality: Right;   AV FISTULA PLACEMENT Right 09/15/2015   Procedure: ARTERIOVENOUS (AV) FISTULA CREATION- RIGHT ARM;  Surgeon: Nada Libman, MD;  Location: [facility] OR;  Service: Vascular;  Laterality: Right;   CAPD INSERTION N/A 05/18/2014    Procedure: LAPAROSCOPIC INSERTION CONTINUOUS AMBULATORY PERITONEAL DIALYSIS  (CAPD) CATHETER;  Surgeon: Axel Filler, MD;  Location: [facility] OR;  Service: General;  Laterality: N/A;   COLONOSCOPY     COLONOSCOPY WITH PROPOFOL N/A 07/12/2016   Procedure: COLONOSCOPY WITH PROPOFOL;  Surgeon: Rachael Fee, MD;  Location: [facility];  Service: Endoscopy;  Laterality: N/A;   CORONARY ARTERY BYPASS GRAFT N/A 10/05/2021   Procedure: CORONARY ARTERY BYPASS GRAFTING (CABG) X 1, ON PUMP, USING LEFT INTERNAL MAMMARY ARTERY CONDUIT;  Surgeon: Evelene Croon  K, MD;  Location: [facility] OR;  Service: Open Heart Surgery;  Laterality: N/A;   declotting of fistula  08/2015   ESOPHAGOGASTRODUODENOSCOPY (EGD) WITH PROPOFOL N/A 07/12/2016   Procedure: ESOPHAGOGASTRODUODENOSCOPY (EGD) WITH PROPOFOL;  Surgeon: Rachael Fee, MD;  Location: [facility];  Service: Endoscopy;  Laterality: N/A;   FISTULA SUPERFICIALIZATION Right 02/07/2016   Procedure: RIIGHT UPPER ARM FISTULA SUPERFICIALIZATION;  Surgeon: Sherren Kerns, MD;  [facility] OR;  Service: Vascular;  Laterality: Right;   IJ catheter insertion     INSERTION OF DIALYSIS CATHETER Right 02/25/2015   Procedure: INSERTION OF RIGHT INTERNAL JUGULAR DIALYSIS CATHETER;  Surgeon: Nada Libman, MD;  Location: [facility] OR;  Service: Vascular;  Laterality: Right;   KIDNEY TRANSPLANT     LAPAROSCOPIC REPOSITIONING CAPD CATHETER N/A 06/16/2014   Procedure: LAPAROSCOPIC REPOSITIONING CAPD CATHETER;  Surgeon: Axel Filler, MD;  Location: [facility] OR;  Service: General;  Laterality: N/A;   LAPAROSCOPY N/A 05/04/2015   Procedure: LAPAROSCOPY DIAGNOSTIC LYSIS OF ADHESIONS;  Surgeon: Abigail Miyamoto, MD;  Location: [facility] OR;  Service: General;  Laterality: N/A;   MINOR REMOVAL OF PERITONEAL DIALYSIS CATHETER N/A 04/28/2015   Procedure:  REMOVAL OF PERITONEAL DIALYSIS CATHETER;  Surgeon: Abigail Miyamoto, MD;  Location: [facility] OR;  Service: General;  Laterality: N/A;   NEPHRECTOMY Left 1974   RENAL  BIOPSY Right 2012   REVISON OF ARTERIOVENOUS FISTULA Right 05/26/2015   Procedure: SUPERFICIALIZATION OF ARTERIOVENOUS FISTULA WITH SIDE BRANCH LIGATIONS;  Surgeon: Nada Libman, MD;  Location: [facility] OR;  Service: Vascular;  Laterality: Right;   RIGHT/LEFT HEART CATH AND CORONARY ANGIOGRAPHY N/A 08/22/2021   Procedure: RIGHT/LEFT HEART CATH AND CORONARY ANGIOGRAPHY;  Surgeon: Lennette Bihari, MD;  [facility]: [facility];  Service: Cardiovascular;  Laterality: N/A;   SKIN CANCER EXCISION     right shoulder   SVT ABLATION N/A 11/23/2016   Procedure: SVT Ablation;  Surgeon: Will Jorja Loa, MD;  [facility];  Service: Cardiovascular;  Laterality: N/A;   TEE WITHOUT CARDIOVERSION N/A 10/05/2021   Procedure: TRANSESOPHAGEAL ECHOCARDIOGRAM (TEE);  Surgeon: Alleen Borne, MD;  Location: [facility] OR;  Service: Open Heart Surgery;  Laterality: N/A;   UMBILICAL HERNIA REPAIR N/A 05/18/2014   Procedure: HERNIA REPAIR UMBILICAL ADULT;  Surgeon: Axel Filler, MD;  Location: [facility] OR;  Service: General;  Laterality: N/A;   UMBILICAL HERNIA REPAIR N/A 04/28/2015   Procedure: UMBILICAL HERNIA REPAIR WITH MESH;  Surgeon: Abigail Miyamoto, MD;  Location: [facility] OR;  Service: General;  Laterality: N/A;    ALLERGIES: No Known Allergies  FAMILY HISTORY:  Family History  Problem Relation Age of Onset   Colon polyps Father    Stroke Father    Dementia Father        vascular   Colon cancer Neg Hx    Stomach cancer Neg Hx     SOCIAL HISTORY: Social History   Socioeconomic History   Marital status: Married    Spouse name: Not on file   Number of children: 3   Years of education: 16   Highest education level: Bachelor's degree (e.g., BA, AB, BS)  Occupational History   Occupation: English as a second language teacher: [employer]  Tobacco Use   Smoking status: Former    Types: Cigars    Quit date: 12/06/2012    Years since quitting: 10.5   Smokeless tobacco: Never  Vaping Use   Vaping status: Never  Used  Substance and Sexual Activity   Alcohol use:  Not Currently   Drug use: No   Sexual activity: Not on file  Other Topics Concern   Not on file  Social History Narrative   Left handed    Lives with wife   Social Determinants of Health   Financial Resource Strain: Low Risk  (06/26/2023)   Received from [facility]   Overall Financial Resource Strain (CARDIA)    Difficulty of Paying Living Expenses: Not hard at all  Food Insecurity: No Food Insecurity (06/26/2023)   Received from [facility]   Hunger Vital Sign    Worried About Running Out of Food in the Last Year: Never true    Ran Out of Food in the Last Year: Never true  Transportation Needs: No Transportation Needs (06/26/2023)   Received from [facility] - Transportation    In the past 12 months, has lack of transportation kept you from medical appointments or from getting medications?: No    Lack of Transportation (Non-Medical): No  Physical Activity: Not on file  Stress: Not on file  Social Connections: Unknown (02/17/2022)   Received from [facility]   Social Network    Social Network: Not on file    MEDICATIONS:  Current Outpatient Medications  Medication Sig Dispense Refill   acetaminophen (TYLENOL) 325 MG tablet Take 2 tablets (650 mg total) by mouth every 4 (four) hours as needed for moderate pain.     albuterol (VENTOLIN HFA) 108 (90 Base) MCG/ACT inhaler Inhale 1 puff into the lungs. Every hour as needed     amLODipine (NORVASC) 5 MG tablet TAKE 1 AND 1/2 TABLETS DAILY BY MOUTH 135 tablet 3   apixaban (ELIQUIS) 5 MG TABS tablet Take 1 tablet (5 mg total) by mouth 2 (two) times daily. 180 tablet 3   B-D UF III MINI PEN NEEDLES 31G X 5 MM MISC USE TO INJECT INSULIN 4 TIMES A DAY 360 each 2   belatacept (NULOJIX) 250 MG SOLR injection Inject 24.5 mLs into the vein every 28 (twenty-eight) days.      carvedilol (COREG) 25 MG tablet Take  1 tablet (25 mg total) by mouth 2 (two) times daily with a meal.     chlorthalidone (HYGROTON) 25 MG tablet Take 1 tablet by mouth daily.     Continuous Glucose Sensor (FREESTYLE LIBRE 3 SENSOR) MISC SMARTSIG:1 TOPICAL EVERY 2 WEEKS 6 each 2   DULoxetine HCl 20 MG CSDR Take 30 mg by mouth daily.     gabapentin (NEURONTIN) 100 MG capsule Take 200-300 mg by mouth See admin instructions. Taking 200 mg in the AM and 300 mg in the evening     hydrALAZINE (APRESOLINE) 100 MG tablet TAKE 1 TABLET BY MOUTH EVERY 8 HOURS 90 tablet 3   insulin aspart (NOVOLOG) 100 UNIT/ML injection Inject 5-20 Units into the skin daily as needed for high blood sugar.     insulin regular human CONCENTRATED (HUMULIN R) 500 UNIT/ML injection Inject 125 Units into the skin every 3 (three) days. Omnipod     levothyroxine (SYNTHROID) 112 MCG tablet TAKE 1 TABLET BY MOUTH EVERY DAY 90 tablet 3   mycophenolate (CELLCEPT) 250 MG capsule Take 500 mg by mouth every 12 (twelve) hours.     NEEDLE, DISP, 18 G (BD HYPODERMIC NEEDLE) 18G X 1" MISC USE AS DIRECTED TO INJECT 0.6MLS OF TESTOSTERONE EVERY 7 DAYS (DRAW UP) 100 each 1   nitroGLYCERIN (NITROSTAT) 0.4 MG SL tablet Place  0.4 mg under the tongue every 5 (five) minutes as needed for chest pain.     NON FORMULARY CPAP/BIPAP  at bedtime  BIPAP per pt     potassium chloride SA (KLOR-CON M20) 20 MEQ tablet Take half a tablet , Potassium 10 meq, for 3 days with 20 mg Lasix as prescribed.  Then call office for new instructions. (Patient taking differently: 20 mEq 2 (two) times daily. Take 1 tablet TWICE DAILY) 90 tablet 3   predniSONE (DELTASONE) 5 MG tablet Take 5 mg by mouth daily with breakfast. Continuously     rosuvastatin (CRESTOR) 20 MG tablet TAKE 1 TABLET BY MOUTH EVERY DAY 90 tablet 0   senna-docusate (SENOKOT-S) 8.6-50 MG tablet Take 1 tablet by mouth at bedtime as needed for mild constipation. (Patient taking differently: Take 1 tablet by mouth every other day.)      Spironolactone 25 MG/5ML SUSP Take 25 mg by mouth daily.     Syringe, Disposable, 1 ML MISC Use to draw up 0.47ml of testosterone medication 25 each 0   SYRINGE-NEEDLE, DISP, 3 ML (BD PLASTIPAK SYRINGE) 21G X 1" 3 ML MISC USE AS DIRECTED TO INJECT 0.6MLS OF TESTOSTERONE EVERY 7 DAYS (INJECT) 100 each 1   tamsulosin (FLOMAX) 0.4 MG CAPS capsule Take 0.8 mg by mouth daily after supper.     Testosterone Cypionate 200 MG/ML SOLN Inject 120 mg as directed once a week. Saturday     torsemide (DEMADEX) 20 MG tablet Take 20 mg by mouth 2 (two) times daily.     traMADol (ULTRAM) 50 MG tablet 1 tablet Oral Once a day for 90 days     traZODone (DESYREL) 50 MG tablet 1 tablet at bedtime as needed Orally Once a day (Do not take if take ambien)     zolpidem (AMBIEN) 10 MG tablet Take 10 mg by mouth at bedtime as needed for sleep.     zonisamide (ZONEGRAN) 25 MG capsule TAKE 3 CAPSULES (75 MG TOTAL) BY MOUTH DAILY 270 capsule 1   doxazosin (CARDURA) 2 MG tablet Take 0.5 tablets (1 mg total) by mouth daily. Take 0.5 Tablet Daily (Patient not taking: Reported on 07/09/2023) 90 tablet 0   No current facility-administered medications for this visit.    PHYSICAL EXAM: Vitals:   07/09/23 1413  BP: 120/60  Pulse: 74  SpO2: 96%  Weight: 294 lb (133.4 kg)  Height: 5\' 11"  (1.803 m)   Body mass index is 41 kg/m.  Wt Readings from Last 3 Encounters:  07/09/23 294 lb (133.4 kg)  07/04/23 288 lb (130.6 kg)  06/24/23 (!) 302 lb (137 kg)    General: Well developed, well nourished male in no apparent distress.  HEENT: AT/[place], no external lesions.  Eyes: Conjunctiva clear and no icterus. Neck: Neck supple  Lungs: Respirations not labored Neurologic: Alert, oriented, normal speech Extremities / Skin: Dry. No sores or rashes noted.  Psychiatric: Does not appear depressed or anxious  Diabetic Foot Exam - Simple   No data filed    LABS Reviewed Lab Results  Component Value Date   HGBA1C 7.9 (H) 07/01/2023    HGBA1C 7.6 (H) 04/29/2023   HGBA1C 7.6 (H) 01/29/2023   Lab Results  Component Value Date   FRUCTOSAMINE 280 10/29/2022   FRUCTOSAMINE 295 (H) 11/28/2021   FRUCTOSAMINE 296 (H) 07/04/2021   Lab Results  Component Value Date   CHOL 116 05/02/2022   HDL 52 05/02/2022   LDLCALC 55 05/02/2022   TRIG 45 05/02/2022  CHOLHDL 2.2 05/02/2022   Lab Results  Component Value Date   MICRALBCREAT 201.8 (H) 08/03/2019   MICRALBCREAT 503.9 (H) 01/04/2014   Lab Results  Component Value Date   CREATININE 2.97 (H) 07/04/2023   Lab Results  Component Value Date   GFR 26.95 (L) 07/01/2023    ASSESSMENT / PLAN  1. Uncontrolled type 2 diabetes mellitus with hyperglycemia, with long-term current use of insulin (HCC)   2. Hypogonadism male   3. Acquired hypothyroidism     Diabetes Mellitus type 2, complicated by diabetic nephropathy and neuropathy. - Diabetic status / severity: Uncontrolled.  Lab Results  Component Value Date   HGBA1C 7.9 (H) 07/01/2023    - Hemoglobin A1c goal <7%   Discussed about compliance with bolusing for all meals.  Discussed about importance of diabetes control.  - Medications:  Insulin pump setting changed as follows: Insulin Pump setting:  Basal ( total 21.45) MN- 0.15u/hour 6AM- 1.65  11AM- 1.7, change to 0.9 5PM- 0.3, changed to 9 PM : 0.3  Bolus CHO Ratio (1unit:CHO) MN- 1:15  Correction/Sensitivity: MN- 1:50  Target: 130   Active insulin time: 6 hours   - Home glucose testing: continue CGM and check blood glucose as needed.  - Discussed/ Gave Hypoglycemia treatment plan.  # Consult : not required at this time.   # CKD 4, ESRD status post renal transplant in 2018, following with nephrology. Last  Lab Results  Component Value Date   MICRALBCREAT 201.8 (H) 08/03/2019    # Foot check nightly / neuropathy, continue gabapentin, following with podiatry/neurology.  # Annual dilated diabetic eye exams.   - Diet: Make healthy  diabetic food choices  2. Blood pressure  -  BP Readings from Last 1 Encounters:  07/09/23 120/60    - Control is in target.  - No change in current plans.  3. Lipid status / Hyperlipidemia - Last  Lab Results  Component Value Date   LDLCALC 55 05/02/2022   - Continue rosuvastatin 20 mg daily.  # Hypothyroidism : Continue levothyroxine 112 mcg daily.  # Hypogonadism : Continue testosterone cypionate 120 mg weekly.  Diagnoses and all orders for this visit:  Uncontrolled type 2 diabetes mellitus with hyperglycemia, with long-term current use of insulin (HCC)  Hypogonadism male  Acquired hypothyroidism    DISPOSITION Follow up in clinic in 3  months suggested.   All questions answered and patient verbalized understanding of the plan.  [place] Ashle Stief, MD [facility] Endocrinology [facility] [address] [place], [address] [place], [place] [address] Phone # [phone]  At least part of this note was generated using voice recognition software. Inadvertent word errors may have occurred, which were not recognized during the proofreading process.

## 2023-07-09 NOTE — Telephone Encounter (Signed)
H&V Care Navigation CSW Progress Note  Clinical Social Worker contacted patient by phone to f/u on request for diabetes supplies. Normally this is handled by specialty provider but wanted to give the courtesy of speaking with pt directly. Was able to reach him at [phone]. Introduced self, role and that my reason for calling was that clinical team had shared challenges with diabetic supplies. I note that pt has an appt with endocrinology team this afternoon and that they will be the best first step to address this concern. Shared they have connections to patient assistance, manufacturers coupons/discounts, and samples. Since we do not manage that medication we would not be the best contact. I inquired if pt still had [facility] and began to share that often times there are preferred medications, pt began to get upset stating that communication has been bad with the "[facility]" and that I "did not get the right message about what he needed." When I attempted to clarify again my role he states he is "terminating the call now," and proceeded to hang up.   Given multiple calls from pt regarding "results," "diabetic supplies," and that staff has been hung up on multiple times. I will route this to clinical team lead to ensure there is understanding about what pt needs and if we are going to be able to address those for him at this time.   Patient is participating in a Managed Medicaid Plan:  No, commercial [employer] only  SDOH Screenings   Food Insecurity: No Food Insecurity (06/26/2023)   Received from [facility]  Housing: Low Risk  (03/21/2022)  Transportation Needs: No Transportation Needs (06/26/2023)   Received from [facility]  Utilities: Not At Risk (06/26/2023)   Received from [facility]  Financial Resource Strain: Low Risk  (06/26/2023)   Received from [facility]  Social Connections: Unknown  (02/17/2022)   Received from [facility], [facility]  Tobacco Use: Medium Risk (07/04/2023)    Octavio Graves, MSW, LCSW Clinical Social Worker II [facility] Heart/Vascular Care Navigation  [phone]- work cell phone (preferred) [phone]- desk phone

## 2023-07-09 NOTE — Telephone Encounter (Signed)
Spoke to patient. He did not want to discuss results. He wanted to speak with a Child psychotherapist regarding getting assistance for dm supplies. Staff message sent to Octavio Graves to assist patient.

## 2023-07-10 ENCOUNTER — Encounter: Payer: Self-pay | Admitting: Endocrinology

## 2023-07-16 ENCOUNTER — Encounter: Payer: Self-pay | Admitting: Endocrinology

## 2023-08-11 ENCOUNTER — Other Ambulatory Visit: Payer: Self-pay | Admitting: Cardiovascular Disease

## 2023-08-27 ENCOUNTER — Ambulatory Visit: Payer: 59 | Attending: Cardiovascular Disease | Admitting: Cardiovascular Disease

## 2023-08-27 ENCOUNTER — Encounter: Payer: Self-pay | Admitting: Cardiovascular Disease

## 2023-08-27 DIAGNOSIS — Z951 Presence of aortocoronary bypass graft: Secondary | ICD-10-CM

## 2023-08-27 DIAGNOSIS — Z7901 Long term (current) use of anticoagulants: Secondary | ICD-10-CM

## 2023-08-27 DIAGNOSIS — Z952 Presence of prosthetic heart valve: Secondary | ICD-10-CM | POA: Diagnosis not present

## 2023-08-27 DIAGNOSIS — I4819 Other persistent atrial fibrillation: Secondary | ICD-10-CM

## 2023-08-27 DIAGNOSIS — Z940 Kidney transplant status: Secondary | ICD-10-CM

## 2023-08-27 DIAGNOSIS — I10 Essential (primary) hypertension: Secondary | ICD-10-CM | POA: Diagnosis not present

## 2023-08-27 DIAGNOSIS — I480 Paroxysmal atrial fibrillation: Secondary | ICD-10-CM

## 2023-08-27 DIAGNOSIS — R600 Localized edema: Secondary | ICD-10-CM

## 2023-08-27 DIAGNOSIS — I471 Supraventricular tachycardia, unspecified: Secondary | ICD-10-CM

## 2023-08-27 DIAGNOSIS — E118 Type 2 diabetes mellitus with unspecified complications: Secondary | ICD-10-CM

## 2023-08-27 DIAGNOSIS — G4733 Obstructive sleep apnea (adult) (pediatric): Secondary | ICD-10-CM

## 2023-08-27 DIAGNOSIS — N184 Chronic kidney disease, stage 4 (severe): Secondary | ICD-10-CM

## 2023-08-27 DIAGNOSIS — Z794 Long term (current) use of insulin: Secondary | ICD-10-CM

## 2023-08-27 NOTE — Progress Notes (Signed)
Patient ID: Robert [name], male   DOB: [birth year], 62 y.o.   MRN: [id number]      HPI: Robert [name], is a 62 y.o. male who is a former patient of Dr. Susa Griffins.  He underwent AVR/CABG on October 05, 2021 and subsequent January 2023 hospitalization. I last saw him on July 26, 2022. He presents for f/u evaluatiuon.  Robert [name]  has a complex medical history that includes a congenital nonfunctioning left kidney for which he underwent left nephrectomy at age 56. He has a history of obesity, diabetes mellitus, and has been demonstrated to have proteinuria of his remaining single kidney, being status post open renal biopsy in [place] in November 2010. This revealed focal segmental glomerular sclerosis. At that time he was seen by Dr. [name] Kinsman in the nephrology department at [facility]. He sees Dr. Kathrene Bongo in [place] for his kidney insufficiency and has been on ARB therapy in the past in an attempt to reduce proteinuria. He sees Dr. Lucianne Muss for his diabetes mellitus. He also has a history of hypothyroidism, hyperlipidemia, anxiety and depression. He has a history of prior supraventricular tachycardia requiring hospitalization in 2007 as was felt to be possibly AV nodal reentrant rhythm.  He has a history of complex sleep apnea initially diagnosed in 2007 with an AHI at that time of 56.7 per hour and during REM sleep this increased to 65.7 per hour. At that time he dropped his oxygen 84% and had loud snoring. He had been on BiPAP therapy. He underwent a 5 year followup split-night study in 2012 which again confirmed severe sleep apnea with an AHI of 60.7 on the baseline portion of the study. He was unable to achieve REM sleep at that time. Oxygen saturation during non-REM sleep at 83%. He was titrated utilizing BiPAP up to a setting of 16/12 cm water pressure. He does admit to continued residual daytime sleepiness despite using his BiPAP  therapy.  An echo Doppler study in August 2012 showed mild concentric LVH with normal systolic function and grade 1 diastolic dysfunction. He had trace mitral and tricuspid insufficiency as well as pulmonic insufficiency. A nuclear perfusion study in August 2012 showed normal perfusion.  Robert [name] was on dialysis Monday, [name].  He is on the [facility] transplant list.  In January 2017 he had an stress echo at [facility] as part of his transplant evaluation.  I do not have the results of this evaluation.  He has had some difficulty with a steal syndrome in his right hand since he had AV fistulas inserted in the forearm and most recently now in the upper arm.  He continues to use BiPAP with 100% compliance.  He had a [employer] unit.  He obtains his CPAP supplies online because they are cheaper, but had seen [facility].  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  A download in the office from 05/28/2016 through 07/26/2016.  He is meeting compliance standards with usage at 85% usage stays greater than 4 hours at 77%.  He is averaging 6 hours and 43 minutes per night.  He is set at an IPAP pressure of 16 and EPAP pressure of 12.  His AHI was mildly increased at 6.5 with 5.2 per hour of apneic events and 1.3 of hypopnea events.  When I last saw him he had stopped taken Bystolic since his blood pressure had become low at the end of dialysis.  He  had been on 5 mg on nondialysis days.  He tells me his aspirin was discontinued because he was continuously having issues with bleeding following his dialysis when the catheter was removed.   He had an episode of SVT in December 2017, which was successfully aborted with vagal maneuvers.  In the emergency room, he had 2-3 more recurrences of SVT, of which, again, all aborted with vagal maneuvers.  He was started on verapamil and discharge from the hospital.  He has seen Dr. Elberta Fortis and had an EP study with attempted ablation, but apparently he  was not able to be induced and ablation was not performed.   He denies any recurrent episodes of SVT.  Remotely, he may of had some mild nonexertional musculoskeletal discomfort but has not experienced any of this recently.  Reportedly, his remote stress echo was negative for ischemia, although mild ST changes were noted in recovery.  He continues to be asymptomatic.  He underwent a kidney transplant from a living donor on 02/25/2017 at [facility].  He tolerated surgery well but has developed a postoperative inguinal hernia, which will ultimately need to be surgically repaired.  He received a new BiPAP machine from [facility] 2 weeks ago.  He has a full face mask, but admits to increased leak.    Since I saw him in September 2018, he had 2 hospital stays at [facility].  He developed intestinal bleeding due to diverticular disease and also had some dehydration issues.  He also underwent inguinal hernia surgery.  He denies any recent chest pain or palpitations.  He continues to use BiPAP with 100% compliance.  However, he did not have his machine with him for the days he was in the hospital.  He brought his machine with him today for interrogation.  Over the past month, usage hours and 6 hours and 32 minutes per night with an HI of 0.9.  He used to machine 27 of 30 nights nights that were not used was because he was in the hospital.  Usage greater than 4 hours was 26 of 30.  He is on a BiPAP pressure of 18/13 0.9.  Recently he has noticed a mild leak.  AHI was 5.6 over the past 7 days.  He has a ResMed fullface mask, Mirage Quatro mask.    When I saw him in November 2018, he was feeling well. He is seeing Dr. Lannie Fields at [facility] from a nephrology standpoint.  He denies any episodes of chest pain or awareness of recurrent arrhythmia.  He admits to 100% use with CPAP.  [name] is his DME company.  He has a ResMed air 10 V auto unit with an EPAP pressure 14.  An IPAP pressure set at 18.  A download from  01/14/2017 through 02/12/2018 shows 100% compliance.  He is averaging 8 hours and 7 minutes of sleep.  AHI is excellent at 2.6.    I saw him in May 2019 and since that time, he has continued to follow at [facility]. He was last evaluated by me in a telemedicine visit in April 2020.  At that time he had noticed that over the past year  his blood pressure had increased and his lisinopril dose was increased to 20 mg and amlodipine reduced to 5 mg.  His weight had fluctuated and has increased from 293 to 303.  He has continued to use BiPAP with 100% compliance.  I was able to obtain a download today from March 23 through January 27, 2019.  He is 100% compliant with average BiPAP use of 7 hours and 10 minutes.  At his 18/14 pressure, AHI remains excellent at 2.6/h.  Over the past 6 months he has changed jobs twice.  He is been staying home particularly with his significant comorbidities which can be affected by the coronavirus including hypertension, diabetes mellitus, obesity, renal disease, and immunosuppression.  He walks approximately 2 times a day for short duration.  He receives immunosuppressant infusion with Nulojix and is scheduled for his next infusion: On Feb 12, 2019.  His last lipid panel was in October 2019 with total cholesterol 137 and LDL cholesterol 73.  He is scheduled to see his nephrologist in August.    I saw him in the office in February 2021 and since his  telemedicine evaluation in April 2020, he continued to be followed closely at [facility].  Creatinine had increased to 1.74.  He has noticed blood pressure elevation.  He has not been successful with weight loss.  He has been without anginal symptoms or awareness of recurrent SVT.  He has continued to use BiPAP therapy.  A download from 10/22/2019 through 11/20/1999 shows 93% of usage days.  Average use is 8 hours and 10 minutes.  At his pressure of 18/14, AHI is excellent at 1.5.  There is no mask leak.  During his February 2021 evaluation he had a 2/6  murmur consistent with aortic stenosis and I recommended follow-up echo Doppler evaluation.   He underwent an echo Doppler study on December 08, 2019 which showed an EF of 65 to 70%, moderate left ventricular hypertrophy and grade 1 diastolic dysfunction.  There was mild mitral valve regurgitation.  There was moderate to severe aortic stenosis with a mean gradient of 39 peak gradient of 58.   He was evaluated in a telemedicine visit in June 2021.  At that time he denied any chest pain, presyncope or syncope.  He continues to go to [facility] for nephrology follow-up.  He states his blood pressure is stable every morning upon awakening but as a day progresses it may go up slightly.  He denies any exertional shortness of breath.  At times he has noticed some mild lower extremity edema.  He was evaluated at [facility] in July 2021 and was hospitalized overnight with right lower quadrant pain around his renal allograft.  A CT scan demonstrated normal-appearing transplant kidney without any acute abdominal/pelvic abnormalities however, significant stool burden was noted on CT suggesting moderate to severe constipation.  I last saw him in December 2021 at which time he denied any chest pain but had experienced some mild shortness of breath particularly with walking up steps.Marland Kitchen He is now on insulin pump.  He has noticed some lower extremity edema.  He continued to use BiPAP.  A download was obtained in the office today from November 7 through September 12, 2020 which shows excellent compliance.  There were only 2 days of nonuse when he never went to bed and it stayed in a recliner.  He is on BiPAP with a pressure of 18/14.  He is averaging 8 hours and 54 minutes of BiPAP use per night.  AHI is excellent at 2.0.  He does have mask leak and has been noticing a leak in the region of his chin.    I recommended he have a follow-up echo Doppler study which was done on November 16, 2020.  This showed continued hyperdynamic LV function with  EF at 65 to 70%  and moderate LVH with grade 2 diastolic dysfunction.  RV size was normal.  Moderately elevated pulmonary artery systolic pressure at 45 mm.  There was mild mitral stenosis with a mean valve gradient of 3.2 mm with moderately severe mitral annular calcification.  There was moderate calcification of his trileaflet aortic valve.  He was felt to have moderate aortic stenosis with a mean gradient of 34.6 and peak gradient of 57 with calculated valve area 1.05 cm.  I saw him on December 01, 2020 at which time he continued to feel well but was experiencing some some leg edema and  shortness of breath with activity.  He is followed closely at [facility] for his renal transplant.  Most recent creatinine on October 27, 2020 was further increased at 1.89.  Hemoglobin A1c was 6.9. He denies palpitations.  He continues to use BiPAP.  I obtained a new download in the office today from January 25 through November 30, 2020.  Compliance is excellent with average use at 10 hours and 16 minutes per night.  At his BiPAP pressure of 18/14, AHI is 2.3.  There are nights with significant mask leak.  During that evaluation, I recommended that he undergo a follow-up echo Doppler study for reassessment of his aortic valve which may be progressing and it stenosis.  When I saw him on August 9,2022 he had recently noticed some slight increase shortness of breath with exertion.  He denied any presyncope or syncope.  However at times when his blood pressure gets too low he does note transient dizziness.  He admitted to swelling in his lower extremities right leg greater than left and has taken as needed furosemide but has not taken this recently.  He wass scheduled to undergo a renal transplant reevaluation in October at [facility].  He denied any chest pain.  He denies any palpitations.  He underwent an echo Doppler study on May 02, 2021.  EF remained stable at 60 to 65%.  There was moderate left ventricular hypertrophy and there was  grade 2 diastolic dysfunction.  There was moderate left atrial dilatation.  There was moderate calcification of a trileaflet aortic valve with moderately severe aortic stenosis.  The mean gradient had now increased to 44.4 and the peak instantaneous gradient to 64 mmHg.  The aortic valve area (VTI) was 0.81 cm.  There was mild dilation of the ascending aorta at 42 mm.  He continues to use BiPAP with 100% compliance.  A new download was obtained from July 10 through May 15, 2021 and average use is 8 hours and 15 minutes per night.  His BiPAP settings are 18/14 and AHI is excellent at 1.6/h.  During that evaluation, I discussed with him that with his progressive aortic valve stenosis he may ultimately require definitive cardiac catheterization and will need extensive CT imaging for potential candidacy for TAVR.  At that time I discussed with him the importance of being seen by his [facility] transplant team.  I also suggested he wear support stockings at least 20 to 30 mg and as needed to take furosemide 20 mg possibly daily or every other 2 to 3 days but depending upon his leg swelling.  I saw him on July 11, 2021 and since his prior evaluation he had contacted [facility] verbally since I saw him, he had apparently contacted [facility] verbally and was told that if catheterization was necessary he would need to undergo go adequate hydration.  Apparently, he subsequently developed a cough on September 24 through  25 that was productive with some purulent sputum.  He was seen by Dr. Wylene Simmer.  A chest x-ray reportedly suggested bilateral pneumonia.  He tested negative for COVID and flu.  He was empirically started on cefdinir yesterday for a 10-day course of antibiotics.  He underwent follow-up laboratory on July 04, 2021.  His creatinine had risen to 1.93 on July 04, 2021 which had increased from 1.77 on March 23, 2021 and from 1.69 on December 13, 2020.  During that evaluation, I stressed to him that I wanted to be seen in  person at [facility] rather than a virtual visit.  He underwent follow-up laboratory at [facility] on July 18, 2021 and his creatinine now had increased to 2.2 with estimated GFR of 33 consistent with stage IIIb CKD, gradually progressing towards stage IV.  He was evaluated at the time by Veronda Prude, PA and his October 11 creatinine was not yet available  and it was felt that his creatinine was stable at 1.7.   I saw him on August 01, 2021 at which time he denied any chest tightness or pressure.  He does have fatigability and low stamina.  He is to undergo a monthly antirejection infusion with Nulojix at the [facility] infusion center and for that he will have a follow-up bmet.  I have also asked that he personally contact his [facility] physician following this regarding his progressive increase in creatinine and potential risk associated with future catheterization if necessary.  He continues to use BiPAP and a download was obtained in the office  from September 25 through July 31, 2021.  Compliance is excellent with average use at 8 hours and 38 minutes.  At his 18/14 centimeters water pressure AHI is excellent at 1.8.  With stabilization of his renal function, I performed right and left heart catheterization on August 22, 2021.  He was found to have 80% ostial proximal LAD stenosis followed by 90% proximal to mid LAD stenosis.  The circumflex vessel had a large second marginal vessel with 50% stenosis in the RCA had 80% mid PDA stenosis with 80% lesion in the AV groove portion before few small posterolateral branches.  Right heart catheterization revealed PA pressure 41/11 with a mean right atrial pressure 4.  Mean aortic gradient was 40 mm with a peak to peak gradient at 42 mm and aortic valve area of 0.95 cm.  Following his cardiac catheterization, he was seen by Judy Pimple, PA in the office.  I scheduled him for surgical consultation with Dr. Lenord Carbo who saw him on September 11, 2021.  He  was felt to have stage D, severe, symptomatic aortic stenosis with New York Heart Association class III symptoms with shortness of breath consistent with chronic diastolic congestive heart failure.  With his severe three-vessel CAD and aortic stenosis he concurred that aortic valve replacement using a bioprosthetic valve and coronary artery bypass graft surgery will be his best treatment options.  He felt the symptoms are most likely related to the combination of severe aortic stenosis and significant CAD.  He was given tentative dates to schedule surgery after Christmas in late December and in early January.    He underwent successful aortic valve replacement using a 27 mm Edwards Inspiris Resilia pericardial valve and CABG x1 with a LIMA graft placed to his LAD by Dr. Laneta Simmers on October 05, 2021.  He was on Coumadin given postoperative atrial fibrillation and possible embolic stroke.  He was rehospitalized in January 2023 with dizziness,  nausea and elevated blood pressure increasing to 204/119.  Medications were adjusted.  He was readmitted and treated for acute pyelonephritis in early February 2023.  At that time lisinopril was discontinued.  He was evaluated by Juanda Crumble, PA-C on December 28, 2021 at which time he denied chest pain, shortness of breath PND orthopnea and was not having any palpitations since his December discharge.  I last saw him on March 12, 2022.  He was back at work virtually.  At times he was experiencing occasional double vision.  He denied any ps recurrent chest pain, presyncope or syncope.  He is on Eliquis for anticoagulation and is on carvedilol 25 mg twice a day, doxazosin 1 mg daily, hydralazine 100 mg every 8 hours, HCTZ 12.5 mg, in addition to his insulin, levothyroxine, and he continues to take prednisone 5 mg and rosuvastatin 20 mg in addition to Cymbalta 60 mg daily.  He continues to use BiPAP with 100% compliance.  He was to undergo neurology follow-up as well as  endocrinology follow-up.  Since I saw him, he was evaluated by Dr. Royann Shivers on March 15, 2022 after he had experienced chest pain that occurred during intense phone conversation/argument with human resources concerning his ability to continue working from home.  He was felt to have angina resulting from the extreme elevation of his heart rate or blood pressure related to emotional stress.  His ECG did not show any acute change.  He was fully anticoagulated on Eliquis.  He was given sublingual nitroglycerin renewal  He was subsequently evaluated by Laural Golden, Pharm.D. for reassessment of blood pressure on April 19, 2022 and blood pressure was stable.  It was recommended that he discontinue hydrochlorothiazide since apparently he was also taking as needed furosemide.  It was also recommended that he discontinue doxazosin.  He was seen by Edd Fabian, NP on May 25, 2022 with complaints of dizziness.  Prior to that he had seen Dr. Everlena Cooper of neurology and had been started on nortriptyline 10 mg at bedtime and duloxetine and trazodone were discontinued.  During his evaluation on August 18, blood pressure was labile and amlodipine was increased to 7.5 mg from 5 mg.  I last saw him in July 26, 2022 at which time he was experiencing significant fatigability.  He was not sleeping well.  He may not be allowed to continue working virtually.  He is now more febrile walking with a cane and will try to investigate potential disability.  He continues to be followed at [facility] for his renal transplant and CKD with most recent creatinine 2.16 on May 01, 2022.  He he continues to be on amlodipine 7.5 mg daily, carvedilol 25 mg twice a day, chlorthalidone 25 mg daily, hydralazine 100 mg twice a day, for blood pressure control.  He is on Humulin insulin.  He is on levothyroxine 112 mcg for hypothyroidism.  He continues to be anticoagulated on Eliquis 5 mg twice a day and he continues to be 8 on Nulojix and prednisone  following his transplant.  He continues to use BiPAP therapy and his current settings are 18 over 14 cm of water.  A download from September 19 through July 25, 2022 shows AHI at 7.1.  There were 2 days of no use when he felt like he needed to sleep in a recliner rather than in bed.  His echo Doppler study on May 21, 2023 showed EF 65 to 70% with severe LVH.  There was mild mitral regurgitation.  27 mm Edwards Inspiris Resilia prosthetic valve was stable although mean gradient was 11 mmHg suggesting very mild aortic valve stenosis.  Since I last saw him, he has been followed at [facility] for his kidney transplant and sees Dr. Royston Cowper.  Recently, creatinine has increased to 2.97 in September 2024 and he has developed more edema.  He has had recurrent issues with Klebsiella in his urinary tract not being cleared with antibiotics.  He has experienced tense lower extremity edema.  He also has followed by the transplant urologist at [facility].  He is unaware of any angina since his CABG surgery by Dr. Laneta Simmers where he had CABG x 1 with a LIMA graft placed to his LAD and 27 mm Edwards Inspiris Resilia pericardial valve for aortic valve replacement.  He is now felt to have chronic atrial fibrillation and when seen by Joni Reining on July 04, 2023 AF rate was increased and he continued to be on carvedilol 25 mg twice a day as well as Eliquis.  Presently, he cannot sleep laying down and has been sleeping in a recliner in the living room and often does not use BiPAP because his wife did not want to have that stay out in the living room.  The limited BiPAP use he had from October 18 through August 24, 2023 showed only 5 of 30 usage days with average use at 3 hours and 25 minutes.  His pressure is set at a minimum EPAP of 14 with maximum IPAP of 25 and pressure support of 4.  He presents for evaluation.   Past Medical History:  Diagnosis Date   Acute cystitis without hematuria 11/09/2021   AKI (acute  kidney injury) 08/08/2017   Elevated sCr to 2.5 (baseline ~1.7) noted at OSH - Subsequent transfer to [facility]  - Currently on IVMF in the setting of diarrhea and AKI - sCr at baseline at time of discharge   Anemia in chronic kidney disease 03/30/2015   Anticoagulated on Coumadin 11/09/2021   Aortic valve stenosis    Arthritis    Atrial fibrillation 11/29/2021   Benign essential hypertension 07/30/2014   prior to transplant pt took Metorpolol tartrate 50mg  qd on off HD days   Benign neoplasm of transverse colon    Cancer    skin ca right shoulder, plastic dsyplasia, pre-Ca polpys removed on Colonoscopy- 07/2014   Cerebrovascular accident (CVA) due to embolism of precerebral artery    Chronic renal failure    Colon polyps 07/23/2014   Tubular adenomas x 5   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   Diverticulosis of colon without hemorrhage    Eczema    ESRD (end stage renal disease)    hemodialysis 06/2014-02/2017, s/p living unrelated kidney transplant 02/26/17   Exertional dyspnea    Fatigue 01/08/2014   Fissure of skin 03/08/2020   GI bleed 07/26/2017   Headache    Heart murmur    aortic stenosis (moderate-severe 04/2021)   History of COVID-19 2023   History of unilateral nephrectomy 09/06/2013   Hydrocele, acquired 04/07/2017   Hyperlipidemia 09/06/2013   Hypertensive urgency 10/21/2021   Hyponatremia 11/09/2021   Hypothyroidism    Immunosuppressed status 02/26/2017   Induction agent: Solumedrol - Envarsus 3mg  daily  - Home dose decreased from 5mg   to 3mg  in the setting of FK 15.7 while inpatient   - Previously on Prograf, though d/c'ed during previous admission; suspected this may be causing hallucinations  - Cellcept 1000mg  Q12H  - Prednisone 5mg  daily    Inguinal hernia without obstruction or gangrene 06/30/2017   Right inguinal wall defect appreciated by CT imaging [place] and on exam Dr. Roby Lofts today Dr. Roby Lofts has discussed hernia repair with mesh at some point in near future  Last Assessment & Plan:  Formatting of this note might be different from the original. Right inguinal wall defect appreciated by CT imaging [place] and on exam Dr. Roby Lofts today Dr. Roby Lofts has discussed hern   Kidney transplanted 02/26/2017   Date of Transplant = 02/26/17  ABO (Donor/Recipient) = compatible Blood type: O+/A+ DonorType: Living donor, unrelated Allograft type:Left kidney Donor anatomy: 2 arteries, 1 veins and single ureter  Pants-to-skirt anastomosis of dual renal arteries on the backtable. Donor Kidney Bx: Not applicable Allograft injury/complications: None Ne   Lower extremity edema 09/06/2013   Macular degeneration    Mild vascular neurocognitive disorder 02/08/2022   OAB (overactive bladder) 12/28/2019   Obstructive sleep apnea 09/06/2013   On Bi-PAP  Last Assessment & Plan:  Formatting of this note might be different from the original. - History of OSA, continue CPAP at night   Peritoneal dialysis catheter in place 02/24/2015   RLQ abdominal pain 04/19/2020   S/P AVR (aortic valve replacement) 10/05/2021   SBO (small bowel obstruction) 05/2015   Scrotal edema 03/14/2017   Testicular ultrasound did show hydrocele  Will refer to urology regarding ongoign swelling, hydrocele.   Last Assessment & Plan:  Formatting of this note might be different from the original.   Testicular ultrasound did show hydrocele  Will refer to urology regarding ongoign swelling, hydrocele.   Shortness of breath    with exertion   Stage 3b chronic kidney disease (CKD) 03/22/2014   SVT (supraventricular tachycardia) 09/06/2013   Tremor of both hands 06/29/2017   Last Assessment & Plan:  Present since transplant, stable and unchanged Query from tacrolimus -Improved tremors noted  and verbalized by pt - Prograf doses titrated per FK levels  Last Assessment & Plan:  Formatting of this note might be different from the original. Present since transplant, stable and unchanged Query from tacrolimus -Improved tremors noted and verbalized by pt - Prograf doses titr   Type 2 diabetes mellitus with hyperglycemia, with long-term current use of insulin 07/14/2015   Umbilical hernia    s/p repair 04/28/15    Past Surgical History:  Procedure Laterality Date   AORTIC VALVE REPLACEMENT N/A 10/05/2021   Procedure: AORTIC VALVE REPLACEMENT (AVR) WITH INSPIRIS RESILIA AORTIC VALVE SIZE ;  Surgeon: Alleen Borne, MD;  Location: [facility] OR;  Service: Open Heart Surgery;  Laterality: N/A;   AV FISTULA PLACEMENT Right 02/25/2015   Procedure: RIGHT ARTERIOVENOUS (AV) FISTULA CREATION;  Surgeon: Nada Libman, MD;  Location: [facility] OR;  Service: Vascular;  Laterality: Right;   AV FISTULA PLACEMENT Right 09/15/2015   Procedure: ARTERIOVENOUS (AV) FISTULA CREATION- RIGHT ARM;  Surgeon: Nada Libman, MD;  Location: [facility] OR;  Service: Vascular;  Laterality: Right;   CAPD INSERTION N/A 05/18/2014   Procedure: LAPAROSCOPIC INSERTION CONTINUOUS AMBULATORY PERITONEAL DIALYSIS  (CAPD) CATHETER;  Surgeon: Axel Filler, MD;  Location: [facility] OR;  Service: General;  Laterality: N/A;   COLONOSCOPY     COLONOSCOPY WITH PROPOFOL N/A 07/12/2016   Procedure: COLONOSCOPY WITH PROPOFOL;  Surgeon: Rachael Fee, MD;  Location: [facility];  Service: Endoscopy;  Laterality: N/A;   CORONARY ARTERY BYPASS GRAFT N/A 10/05/2021   Procedure: CORONARY ARTERY BYPASS GRAFTING (CABG) X 1, ON PUMP, USING LEFT INTERNAL MAMMARY ARTERY CONDUIT;  Surgeon: Alleen Borne, MD;  Location: [facility] OR;  Service: Open Heart Surgery;  Laterality: N/A;   declotting of fistula  08/2015   ESOPHAGOGASTRODUODENOSCOPY (EGD) WITH PROPOFOL N/A 07/12/2016   Procedure: ESOPHAGOGASTRODUODENOSCOPY (EGD) WITH PROPOFOL;  Surgeon: Rachael Fee, MD;   Location: [facility];  Service: Endoscopy;  Laterality: N/A;   FISTULA SUPERFICIALIZATION Right 02/07/2016   Procedure: RIIGHT UPPER ARM FISTULA SUPERFICIALIZATION;  Surgeon: Sherren Kerns, MD;  Location: [facility] OR;  Service: Vascular;  Laterality: Right;   IJ catheter insertion     INSERTION OF DIALYSIS CATHETER Right 02/25/2015   Procedure: INSERTION OF RIGHT INTERNAL JUGULAR DIALYSIS CATHETER;  Surgeon: Nada Libman, MD;  Location[facility];  Service: Vascular;  Laterality: Right;   KIDNEY TRANSPLANT     LAPAROSCOPIC REPOSITIONING CAPD CATHETER N/A 06/16/2014   Procedure: LAPAROSCOPIC REPOSITIONING CAPD CATHETER;  Surgeon: Axel Filler, MD;  [facility];  Service: General;  Laterality: N/A;   LAPAROSCOPY N/A 05/04/2015   Procedure: LAPAROSCOPY DIAGNOSTIC LYSIS OF ADHESIONS;  Surgeon: Abigail Miyamoto, MD;  Location[facility];  Service: General;  Laterality: N/A;   MINOR REMOVAL OF PERITONEAL DIALYSIS CATHETER N/A 04/28/2015   Procedure:  REMOVAL OF PERITONEAL DIALYSIS CATHETER;  Surgeon: Abigail Miyamoto, MD;  Location: [facility] OR;  Service: General;  Laterality: N/A;   NEPHRECTOMY Left 1974   RENAL BIOPSY Right 2012   REVISON OF ARTERIOVENOUS FISTULA Right 05/26/2015   Procedure: SUPERFICIALIZATION OF ARTERIOVENOUS FISTULA WITH SIDE BRANCH LIGATIONS;  Surgeon: Nada Libman, MD;  [facility];  Service: Vascular;  Laterality: Right;   RIGHT/LEFT HEART CATH AND CORONARY ANGIOGRAPHY N/A 08/22/2021   Procedure: RIGHT/LEFT HEART CATH AND CORONARY ANGIOGRAPHY;  Surgeon: Lennette Bihari, MD;  [facility] INV[facility];  Service: Cardiovascular;  Laterality: N/A;   SKIN CANCER EXCISION     right shoulder   SVT ABLATION N/A 11/23/2016   Procedure: SVT Ablation;  Surgeon: Will Jorja Loa, MD;  [facility];  Service: Cardiovascular;  Laterality: N/A;   TEE WITHOUT CARDIOVERSION N/A 10/05/2021   Procedure: TRANSESOPHAGEAL ECHOCARDIOGRAM (TEE);  Surgeon: Alleen Borne, MD;   Location: [facility] OR;  Service: Open Heart Surgery;  Laterality: N/A;   UMBILICAL HERNIA REPAIR N/A 05/18/2014   Procedure: HERNIA REPAIR UMBILICAL ADULT;  Surgeon: Axel Filler, MD;  Location: [facility] OR;  Service: General;  Laterality: N/A;   UMBILICAL HERNIA REPAIR N/A 04/28/2015   Procedure: UMBILICAL HERNIA REPAIR WITH MESH;  Surgeon: Abigail Miyamoto, MD;  Location: [facility] OR;  Service: General;  Laterality: N/A;    Left nephrectomy in 1974.  No Known Allergies  Current Outpatient Medications  Medication Sig Dispense Refill   acetaminophen (TYLENOL) 325 MG tablet Take 2 tablets (650 mg total) by mouth every 4 (four) hours as needed for moderate pain.     albuterol (VENTOLIN HFA) 108 (90 Base) MCG/ACT inhaler Inhale 1 puff into the lungs. Every hour as needed     amLODipine (NORVASC) 5 MG tablet TAKE 1 AND 1/2 TABLETS DAILY BY MOUTH 135 tablet 3   apixaban (ELIQUIS) 5 MG TABS tablet Take 1 tablet (5 mg total) by mouth 2 (two) times daily. 180 tablet 3   B-D UF III MINI PEN NEEDLES 31G X  5 MM MISC USE TO INJECT INSULIN 4 TIMES A DAY 360 each 2   belatacept (NULOJIX) 250 MG SOLR injection Inject 24.5 mLs into the vein every 28 (twenty-eight) days.      carvedilol (COREG) 25 MG tablet Take 1 tablet (25 mg total) by mouth 2 (two) times daily with a meal.     Continuous Glucose Sensor (FREESTYLE LIBRE 3 SENSOR) MISC SMARTSIG:1 TOPICAL EVERY 2 WEEKS 6 each 2   DULoxetine HCl 20 MG CSDR Take 30 mg by mouth daily.     gabapentin (NEURONTIN) 100 MG capsule Take 200-300 mg by mouth See admin instructions. Taking 200 mg in the AM and 300 mg in the evening     hydrALAZINE (APRESOLINE) 100 MG tablet TAKE 1 TABLET BY MOUTH EVERY 8 HOURS 90 tablet 3   insulin aspart (NOVOLOG) 100 UNIT/ML injection Inject 5-20 Units into the skin daily as needed for high blood sugar.     insulin regular human CONCENTRATED (HUMULIN R) 500 UNIT/ML injection Inject 125 Units into the skin every 3 (three) days. Omnipod      levothyroxine (SYNTHROID) 112 MCG tablet TAKE 1 TABLET BY MOUTH EVERY DAY 90 tablet 3   mycophenolate (CELLCEPT) 250 MG capsule Take 500 mg by mouth every 12 (twelve) hours.     NEEDLE, DISP, 18 G (BD HYPODERMIC NEEDLE) 18G X 1" MISC USE AS DIRECTED TO INJECT 0.6MLS OF TESTOSTERONE EVERY 7 DAYS (DRAW UP) 100 each 1   nitroGLYCERIN (NITROSTAT) 0.4 MG SL tablet Place 0.4 mg under the tongue every 5 (five) minutes as needed for chest pain.     NON FORMULARY CPAP/BIPAP  at bedtime  BIPAP per pt     potassium chloride SA (KLOR-CON M20) 20 MEQ tablet Take half a tablet , Potassium 10 meq, for 3 days with 20 mg Lasix as prescribed.  Then call office for new instructions. (Patient taking differently: 20 mEq 2 (two) times daily. Take 1 tablet TWICE DAILY) 90 tablet 3   predniSONE (DELTASONE) 5 MG tablet Take 5 mg by mouth daily with breakfast. Continuously     rosuvastatin (CRESTOR) 20 MG tablet TAKE 1 TABLET BY MOUTH EVERY DAY 90 tablet 0   senna-docusate (SENOKOT-S) 8.6-50 MG tablet Take 1 tablet by mouth at bedtime as needed for mild constipation. (Patient taking differently: Take 1 tablet by mouth every other day.)     Spironolactone 25 MG/5ML SUSP Take 25 mg by mouth daily.     Syringe, Disposable, 1 ML MISC Use to draw up 0.23ml of testosterone medication 25 each 0   SYRINGE-NEEDLE, DISP, 3 ML (BD PLASTIPAK SYRINGE) 21G X 1" 3 ML MISC USE AS DIRECTED TO INJECT 0.6MLS OF TESTOSTERONE EVERY 7 DAYS (INJECT) 100 each 1   tamsulosin (FLOMAX) 0.4 MG CAPS capsule Take 0.8 mg by mouth daily after supper.     Testosterone Cypionate 200 MG/ML SOLN Inject 120 mg as directed once a week. Saturday     torsemide (DEMADEX) 20 MG tablet Take 20 mg by mouth 2 (two) times daily.     traMADol (ULTRAM) 50 MG tablet 1 tablet Oral Once a day for 90 days     traZODone (DESYREL) 50 MG tablet 1 tablet at bedtime as needed Orally Once a day (Do not take if take ambien)     zolpidem (AMBIEN) 10 MG tablet Take 10 mg by mouth at  bedtime as needed for sleep.     zonisamide (ZONEGRAN) 25 MG capsule TAKE 3 CAPSULES (75 MG  TOTAL) BY MOUTH DAILY 270 capsule 1   No current facility-administered medications for this visit.   Social history is notable in that he is married for over 30 years. His 3 children. He has a degree in chemistry. 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 He does drink occasional alcohol. He has smoked cigars.  ROS General: Negative; No fevers, chills, or night sweats; positive for morbid obesity HEENT: Negative; No changes in vision or hearing, sinus congestion, difficulty swallowing Pulmonary: Recent cough with some purulent sputum, without fever, treated for bilateral pneumonia by Dr. 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  PE BP 96/60   Pulse (!) 102   Ht 5\' 11"  (1.803 m)   Wt 297 lb (134.7 kg)   SpO2 96%   BMI 41.42 kg/m    Left arm blood pressure 124/80;  he has a right upper arm fistula.  Wt Readings from Last 3 Encounters:  08/27/23 297 lb (134.7 kg)  07/09/23 294 lb (133.4 kg)  07/04/23 288 lb (130.6 kg)   General: Alert, oriented, no distress.  Skin: normal turgor, no rashes, warm and dry HEENT: Normocephalic, atraumatic. Pupils equal round and reactive to light; sclera anicteric; extraocular  muscles intact;  Nose without nasal septal hypertrophy Mouth/Parynx benign; Mallinpatti scale 4 Neck: Thick neck no JVD, no carotid bruits; normal carotid upstroke Lungs: clear to ausculatation and percussion; no wheezing or rales Chest wall: without tenderness to palpitation Heart: PMI not displaced, irregularly irregular with rate increased at 102, s1 s2 normal, 1/6 systolic murmur, no diastolic murmur, no rubs, gallops, thrills, or heaves Abdomen: soft, nontender; no hepatosplenomehaly, BS+; abdominal aorta nontender and not dilated by palpation. Back: no CVA tenderness Pulses 2+ Musculoskeletal: full range of motion, normal strength, no joint deformities Extremities: Tense lower extremity edema right greater than left;  no clubbing cyanosis or edema, Homan's sign negative  Neurologic: grossly nonfocal; Cranial nerves grossly wnl Psychologic: Normal mood and affect    EKG Interpretation Date/Time:  Tuesday August 27 2023 08:08:39 EST Ventricular Rate:  102 PR Interval:    QRS Duration:  94 QT Interval:  374 QTC Calculation: 487 R Axis:   -9  Text Interpretation: Atrial fibrillation with rapid ventricular response with premature ventricular or aberrantly conducted complexes Septal infarct , age undetermined ST & T wave abnormality, consider lateral ischemia When compared with ECG of 04-Jul-2023 10:38, No significant change was found Confirmed by Robert [name] ([id number]) on 08/27/2023 9:05:03 AM    I personally reviewed his ECG from July 04, 2023 which showed atrial fibrillation with ventricular rate at 93 bpm.  July 26, 2022  ECG (independently read by me): NSR at 69, QS V1-2; QTc 497 msec  March 12, 2022 ECG (independently read by me): NSR at 73, LVH with repolarization changes; QTc prolongation at 511  September 18, 2021 ECG (independently read by me): Normal sinus rhythm at 81 bpm, QS complex V1 through V3.  Inferior and lateral ST-T changes.  August 01, 2021 ECG  (independently read by me):  NSR at 66, STT changes inferolaterally  July 11, 2021 ECG (independently read by me):  NSR at 69, LVH with repolarization, no ectopy, normal intervals    May 16, 2021 ECG (independently read by me):  Sinus rhythm at 61 with sinus arrythmia; LVH, T wave abnormality I, aVL, normal intervals  February 2022 ECG (independently read by me): NSR at 70; QS anteroseptally, inferolateral T wave abnormality  September 14, 2020 ECG (independently read by me): Normal sinus rhythm at 73 bpm.  Poor anterior R wave progression V1 through V3.  Mild ST changes laterally.  Normal intervals.  No ectopy.  November 20, 2019 ECG (independently read by me): Normal sinus rhythm at 68 bpm.  Q-wave lead III.  Mild inferior T wave abnormality.  Normal intervals.  QTc interval 440 ms.  No ectopy  May 2019 ECG (independently read by me): NSR at 64; normal intervals.  No ectopy  November 2018 ECG (independently read by me): normal sinus rhythm at 67 bpm.  Normal intervals.  No ectopy.  September 2018 ECG (independently read by me): normal sinus rhythm at 71 bpm.  No ST segment changes.  Normal intervals.  May 2018 ECG (independently read by me): Normal sinus rhythm at 89 bpm.  No STT changes .  PR interval 126 ms.  QTc interval 457 ms. Normal ECG.  October 2017 ECG (independently read by me): Sinus tachycardia 101 bpm.  April 2017 ECG (independently read by me): Sinus tachycardia 100 bpm.  2.  TC interval 45 ms.  November 2014 ECG: Normal sinus rhythm;  PR interval 138 ms. QT interval 448 ms.  LABS:    Latest Ref Rng & Units 07/04/2023   11:41 AM 07/01/2023   11:13 AM 06/24/2023   12:00 AM  BMP  Glucose 70 - 99 mg/dL 409  811  914   BUN 8 - 27 mg/dL 51  43  43   Creatinine 0.76 - 1.27 mg/dL 7.82  9.56  2.13   BUN/Creat Ratio 10 - 24 17   14    Sodium 134 - 144 mmol/L 138  140  139   Potassium 3.5 - 5.2 mmol/L 4.4  4.0  3.7   Chloride 96 - 106 mmol/L 98  104  100   CO2 20 - 29  mmol/L 25  24  21    Calcium 8.6 - 10.2 mg/dL 9.5  9.7  9.5       Latest Ref Rng & Units 10/15/2022    4:25 PM 05/01/2022    3:54 AM 04/19/2022    3:17 PM  Hepatic Function  Total Protein 6.5 - 8.1 g/dL 6.2  5.8  6.8   Albumin 3.5 - 5.0 g/dL 3.9  3.5  4.8   AST 15 - 41 U/L 19  15  14    ALT 0 - 44 U/L 13  13  11    Alk Phosphatase 38 - 126 U/L 50  59  88   Total Bilirubin 0.3 - 1.2 mg/dL 1.0  0.5  0.4        Latest Ref Rng & Units 07/04/2023   11:41 AM 06/24/2023   12:00 AM 10/15/2022    4:52 PM  CBC  WBC 3.4 - 10.8 x10E3/uL 11.9  11.8    Hemoglobin 13.0 - 17.7 g/dL 08.6  57.8  46.9   Hematocrit 37.5 - 51.0 % 43.8  42.7  38.0   Platelets 150 - 450 x10E3/uL 227  201  Lab Results  Component Value Date   MCV 87 07/04/2023   MCV 86 06/24/2023   MCV 87.5 10/15/2022   Lab Results  Component Value Date   TSH 2.04 07/01/2023   Lab Results  Component Value Date   HGBA1C 7.9 (H) 07/01/2023   Lipid Panel     Component Value Date/Time   CHOL 116 05/02/2022 0334   TRIG 45 05/02/2022 0334   HDL 52 05/02/2022 0334   CHOLHDL 2.2 05/02/2022 0334   VLDL 9 05/02/2022 0334   LDLCALC 55 05/02/2022 0334    RADIOLOGY: No results found.  ECHO: 05/02/2021 IMPRESSIONS   1. Left ventricular ejection fraction, by estimation, is 60 to 65%. The  left ventricle has normal function. The left ventricle has no regional  wall motion abnormalities. There is moderate left ventricular hypertrophy.  Left ventricular diastolic  parameters are consistent with Grade II diastolic dysfunction  (pseudonormalization). Elevated left ventricular end-diastolic pressure.   2. Right ventricular systolic function is normal. The right ventricular  size is normal. There is moderately elevated pulmonary artery systolic  pressure.   3. Left atrial size was moderately dilated.   4. The mitral valve is abnormal. Mild mitral valve regurgitation.  Moderate to severe mitral annular calcification.   5. The aortic  valve is tricuspid. There is moderate calcification of the  aortic valve. Aortic valve regurgitation is trivial. Moderate to severe  aortic valve stenosis. Aortic valve area, by VTI measures 1.09 cm. Aortic  valve mean gradient measures 44.4   mmHg. Aortic valve Vmax measures 3.99 m/s. DI is 0.29 - based on LVOT  diameter of 2.2 cm.   6. Aortic dilatation noted. There is mild dilatation of the ascending  aorta, measuring 42 mm.   7. The inferior vena cava is dilated in size with >50% respiratory  variability, suggesting right atrial pressure of 8 mmHg.   Comparison(s): Changes from prior study are noted. 11/16/2020: LVEF 65-70%,  moderate AS -mean gradient 34.6 mmHg.     R/L HEART CATH: 08/22/2021    2nd Mrg lesion is 50% stenosed.   Lat 2nd Mrg lesion is 20% stenosed.   RPAV lesion is 80% stenosed.   Dist LM to Prox LAD lesion is 80% stenosed.   Prox LAD to Mid LAD lesion is 90% stenosed.   LV end diastolic pressure is mildly elevated.    Right [facility] RA: A-wave 7, V wave 4; mean 4 RV: 41/6 PA: 41/11 PW: A-wave 17, V wave 15; mean 15.  Initial AO: 114/60          LV: 161/22  Pullback: LV: 162/23 AO: 120/62  By the Fick method, cardiac output 7.3 L/min with cardiac index 2.9 L/min/m.  PVR: 1.8WU  Mean aortic gradient 40 mmHg Peak to peak aortic gradient 42 mmHg AVA: 0.95 cm2     IMPRESSION:  1. Essential hypertension   2. S/P AVR (aortic valve replacement)   3. S/P CABG (coronary artery bypass graft)   4. Persistent atrial fibrillation (HCC)   5. SVT (supraventricular tachycardia) (HCC)   6. OSA on BiPAP   7. History of renal transplantation   8. Long term (current) use of anticoagulants   9. Type 2 diabetes mellitus with complication, with long-term current use of insulin (HCC)   10. Morbid obesity (HCC)   11. Lower extremity edema   12. CKD (chronic kidney disease) stage 4, GFR 15-29 ml/min (HCC)     ASSESSMENT AND PLAN: Mr. Boldt is a 62 year-old  male who has a history of morbid obesity, hypertension, diabetes mellitus, obstructive sleep apnea on BiPAP, aortic valve disease, hyperlipidemia, and end-stage kidney disease.  He is status post remote nephrectomy in1974 and on 02/25/2017 after having been on hemodialysis for 3 years he underwent successful renal transplantation from a living donor done at [facility].  He developed a postoperative inguinal hernia, which subsequently was repaired.  There also was a history of possible diverticular bleeding.  He has a history of SVT, but he has not had any recurrence.  He is followed closely by the transplantation team at [facility].   Over the years, he developed progressive aortic valve stenosis and developed symptoms of exertional dyspnea leading to his right and left heart cardiac catheterization.  He was felt to have stage D severe aortic valve stenosis with class III symptoms with shortness of breath and chronic diastolic heart failure.  Ultimately he underwent successful AVR with a number 27 mm Edwards Inspiris Resilia pericardial valve and single-vessel CABG with a LIMA to his LAD.  He was felt to have CVA postoperatively due to embolism of precerebral artery and initially was anticoagulated with warfarin.  He has had subsequent rehospitalization with significant blood pressure elevation, polynephritis and also had an episode of COVID.  I reviewed his evaluation since my encounter with him on March 12, 2022.  Over the past year, he has had continued difficulty.  His creatinine has increased to 2.97 in September 2024.  He has had recurrent urinary tract infections secondary to Klebsiella which has not been able to be cleared with antibiotic therapy.  He has developed atrial fibrillation with heart rate approximately 90-100.  He is significantly fatigued.  Apparently, he has been unable to sleep on his back and therefore has been sleeping in the living room recliner.  While sleeping in a recliner he  is routinely not use BiPAP since his wife did not want the machine out in the living room.  I told him it is important for him to sleep with BiPAP every night and that after he gets up he can put the machine in a drawer so it is not visible.  I discussed the high likelihood of recurrent atrial fibrillation with his significant reduction in BiPAP use.  I stressed the importance of 100% use and with minimum 7 and 9 hours if at all possible.  He has not had any recurrent anginal symptoms.  He is followed very closely at [facility] transplant as well as by urology at [facility] who will be rechecking laboratory in the very near future.  He continues to see Dr. Everlena Cooper very for neurologic care.  Dr. Lucianne Muss has retired and he now sees Dr. Iraq Thapa for endocrinology.  I discussed with him my likelihood of retirement next year and I will transfer his care to Dr. Royann Shivers after my retirement.  Lennette Bihari, MD, [facility]  09/04/2023 3:53 PM

## 2023-08-27 NOTE — Patient Instructions (Signed)
Medication Instructions:  No medication changes *If you need a refill on your cardiac medications before your next appointment, please call your pharmacy*   Lab Work: None If you have labs (blood work) drawn today and your tests are completely normal, you will receive your results only by: MyChart Message (if you have MyChart) OR A paper copy in the mail If you have any lab test that is abnormal or we need to change your treatment, we will call you to review the results.   Testing/Procedures: None   Follow-Up: At [facility], you and your health needs are our priority.  As part of our continuing mission to provide you with exceptional heart care, we have created designated Provider Care Teams.  These Care Teams include your primary Cardiologist (physician) and Advanced Practice Providers (APPs -  Physician Assistants and Nurse Practitioners) who all work together to provide you with the care you need, when you need it.  We recommend signing up for the patient portal called "MyChart".  Sign up information is provided on this After Visit Summary.  MyChart is used to connect with patients for Virtual Visits (Telemedicine).  Patients are able to view lab/test results, encounter notes, upcoming appointments, etc.  Non-urgent messages can be sent to your provider as well.   To learn more about what you can do with MyChart, go to [email or web address].    Your next appointment:   1 year(s)  Provider:   Establish Care with Dr. Thurmon Fair

## 2023-09-04 ENCOUNTER — Encounter: Payer: Self-pay | Admitting: Cardiovascular Disease

## 2023-09-06 ENCOUNTER — Other Ambulatory Visit: Payer: Self-pay | Admitting: Endocrinology

## 2023-09-06 DIAGNOSIS — E782 Mixed hyperlipidemia: Secondary | ICD-10-CM

## 2023-09-16 ENCOUNTER — Telehealth: Payer: Self-pay | Admitting: Cardiovascular Disease

## 2023-09-16 NOTE — Telephone Encounter (Signed)
Pt called in asking to speak with nurse about weight gain and SOB. No SOB currently. Please advise.    Pt c/o swelling/edema: STAT if pt has developed SOB within 24 hours  If swelling, where is the swelling located? Legs and right arm   How much weight have you gained and in what time span?              12/3: 319 (states he gained 23 lbs in a month)             12/9: 311    Have you gained 2 pounds in a day or 5 pounds in a week? No   Do you have a log of your daily weights (if so, list)? See above  Are you currently taking a fluid pill? Yes   Are you currently SOB? No   Have you traveled recently in a car or plane for an extended period of time? No

## 2023-09-16 NOTE — Telephone Encounter (Signed)
Spoke with pt, he reports swelling in feet and ankles that is there all the time. He gets SOB with exertion within the home and he sleeps in a recliner. They have asked for his dry weight to be 280 lb and today he is 311 lb. He does have a cough but reports due to his anti rejection drugs he keeps a cough. He reports taking torsemide 20 mg one tablet twice daily. Offered appointment for him to be seen today but he has a kidney scan this afternoon and will go to [facility] tomorrow to see his kidney doctor. He was given the okay to increase torsemide to 40 mg twice daily for 3 days and then call if his symptoms have not improved. Also offered for patient to go to the ER to get IV fluid medication and he reports he will not do that.

## 2023-09-19 ENCOUNTER — Ambulatory Visit: Payer: 59 | Admitting: Podiatry

## 2023-09-25 ENCOUNTER — Other Ambulatory Visit: Payer: Self-pay

## 2023-09-27 ENCOUNTER — Other Ambulatory Visit: Payer: 59

## 2023-09-27 ENCOUNTER — Other Ambulatory Visit: Payer: Self-pay

## 2023-09-27 DIAGNOSIS — E1165 Type 2 diabetes mellitus with hyperglycemia: Secondary | ICD-10-CM

## 2023-09-28 LAB — BASIC METABOLIC PANEL
BUN/Creatinine Ratio: 14 (calc) (ref 6–22)
BUN: 36 mg/dL — ABNORMAL HIGH (ref 7–25)
CO2: 23 mmol/L (ref 20–32)
Calcium: 9.8 mg/dL (ref 8.6–10.3)
Chloride: 107 mmol/L (ref 98–110)
Creat: 2.57 mg/dL — ABNORMAL HIGH (ref 0.70–1.35)
Glucose, Bld: 108 mg/dL — ABNORMAL HIGH (ref 65–99)
Potassium: 4 mmol/L (ref 3.5–5.3)
Sodium: 141 mmol/L (ref 135–146)

## 2023-09-28 LAB — HEMOGLOBIN A1C
Hgb A1c MFr Bld: 7.7 %{Hb} — ABNORMAL HIGH (ref <5.7)
Mean Plasma Glucose: 174 mg/dL
eAG (mmol/L): 9.7 mmol/L

## 2023-10-08 IMAGING — DX DG CHEST 2V
2 series · 2 of 2 positions shown · non-contrast
Comparison: Chest x-ray dated July 23, 2017.

CLINICAL DATA: Chronic shortness of breath. Preoperative study for
CABG/AVR.

EXAM:
CHEST - 2 VIEW

[w chest pa]
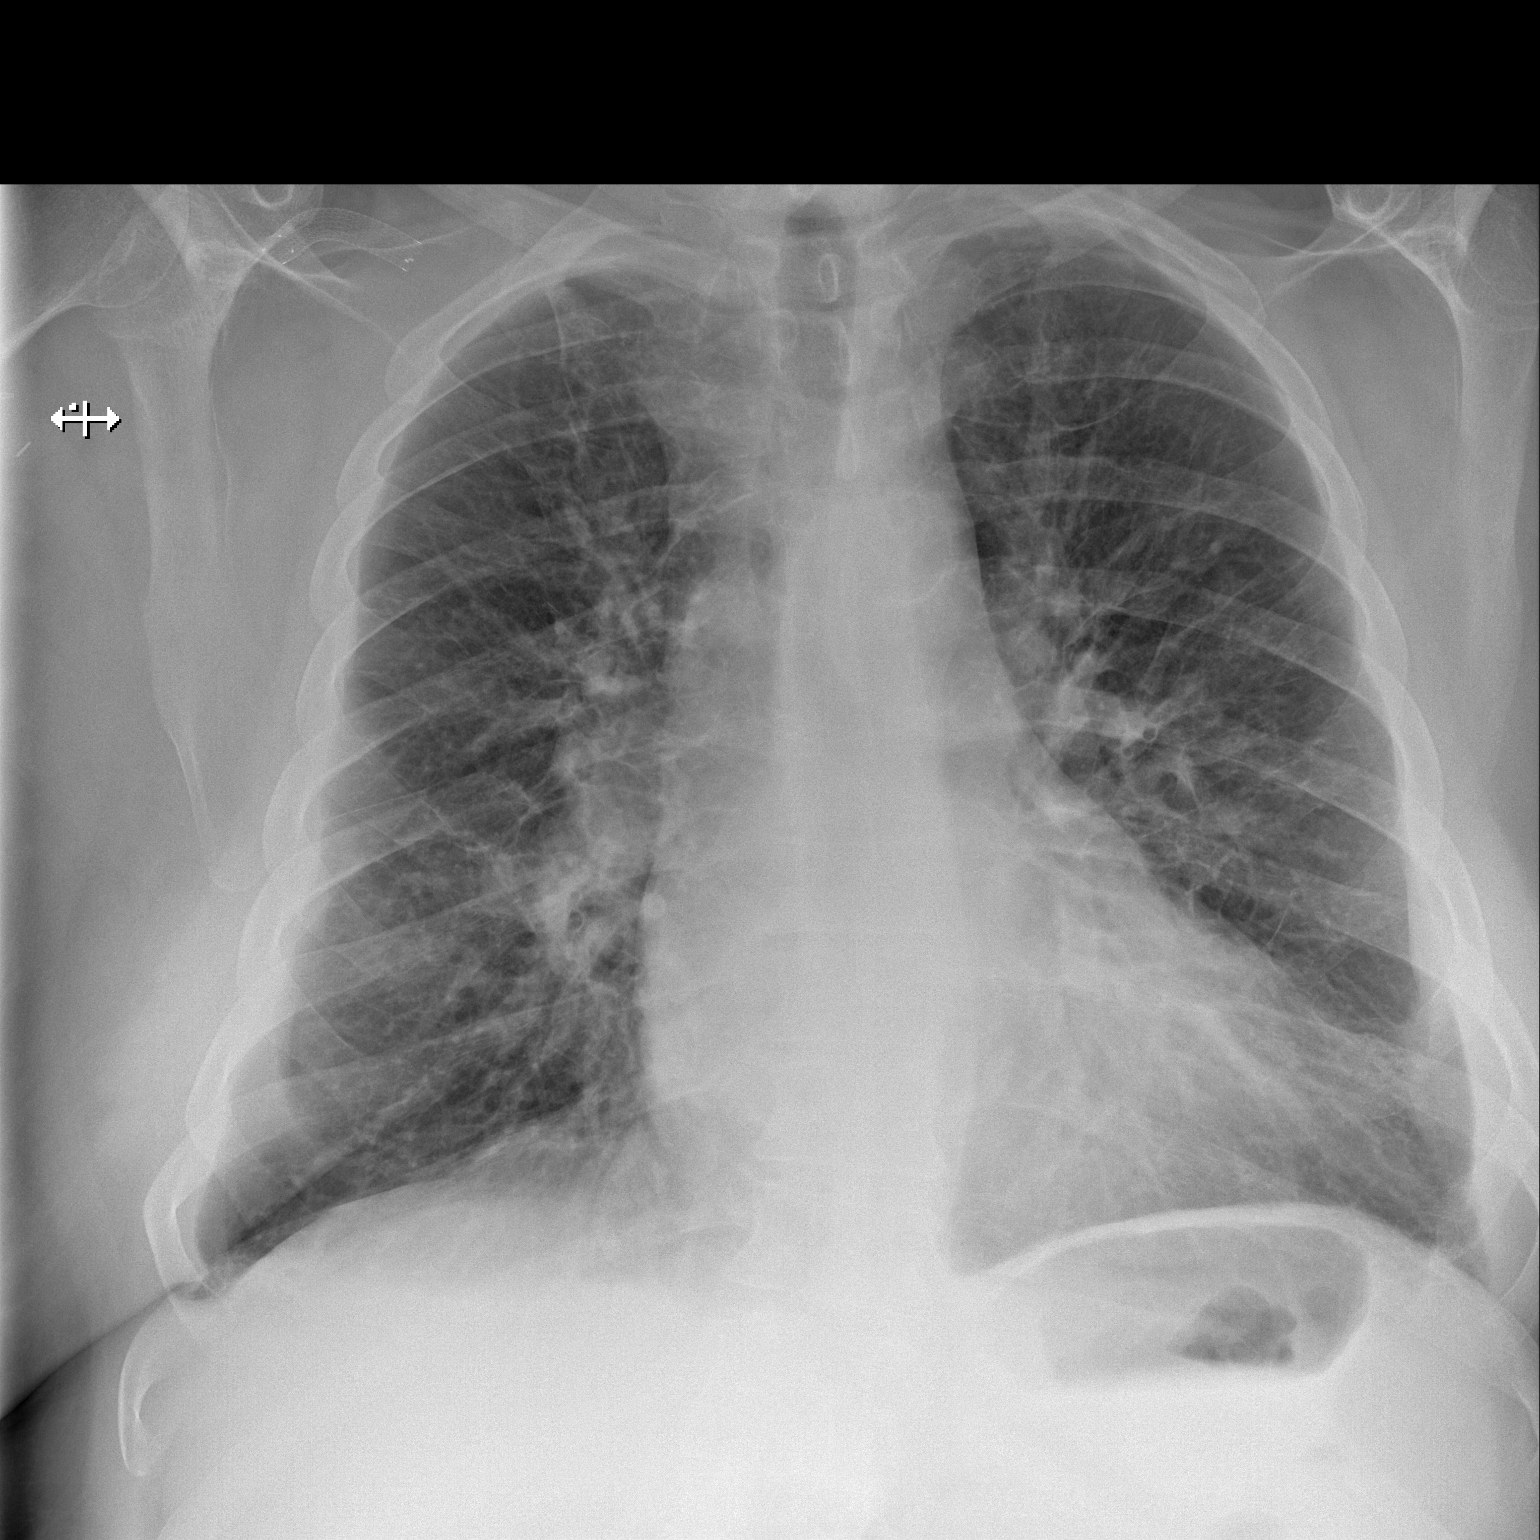

[w chest lat]
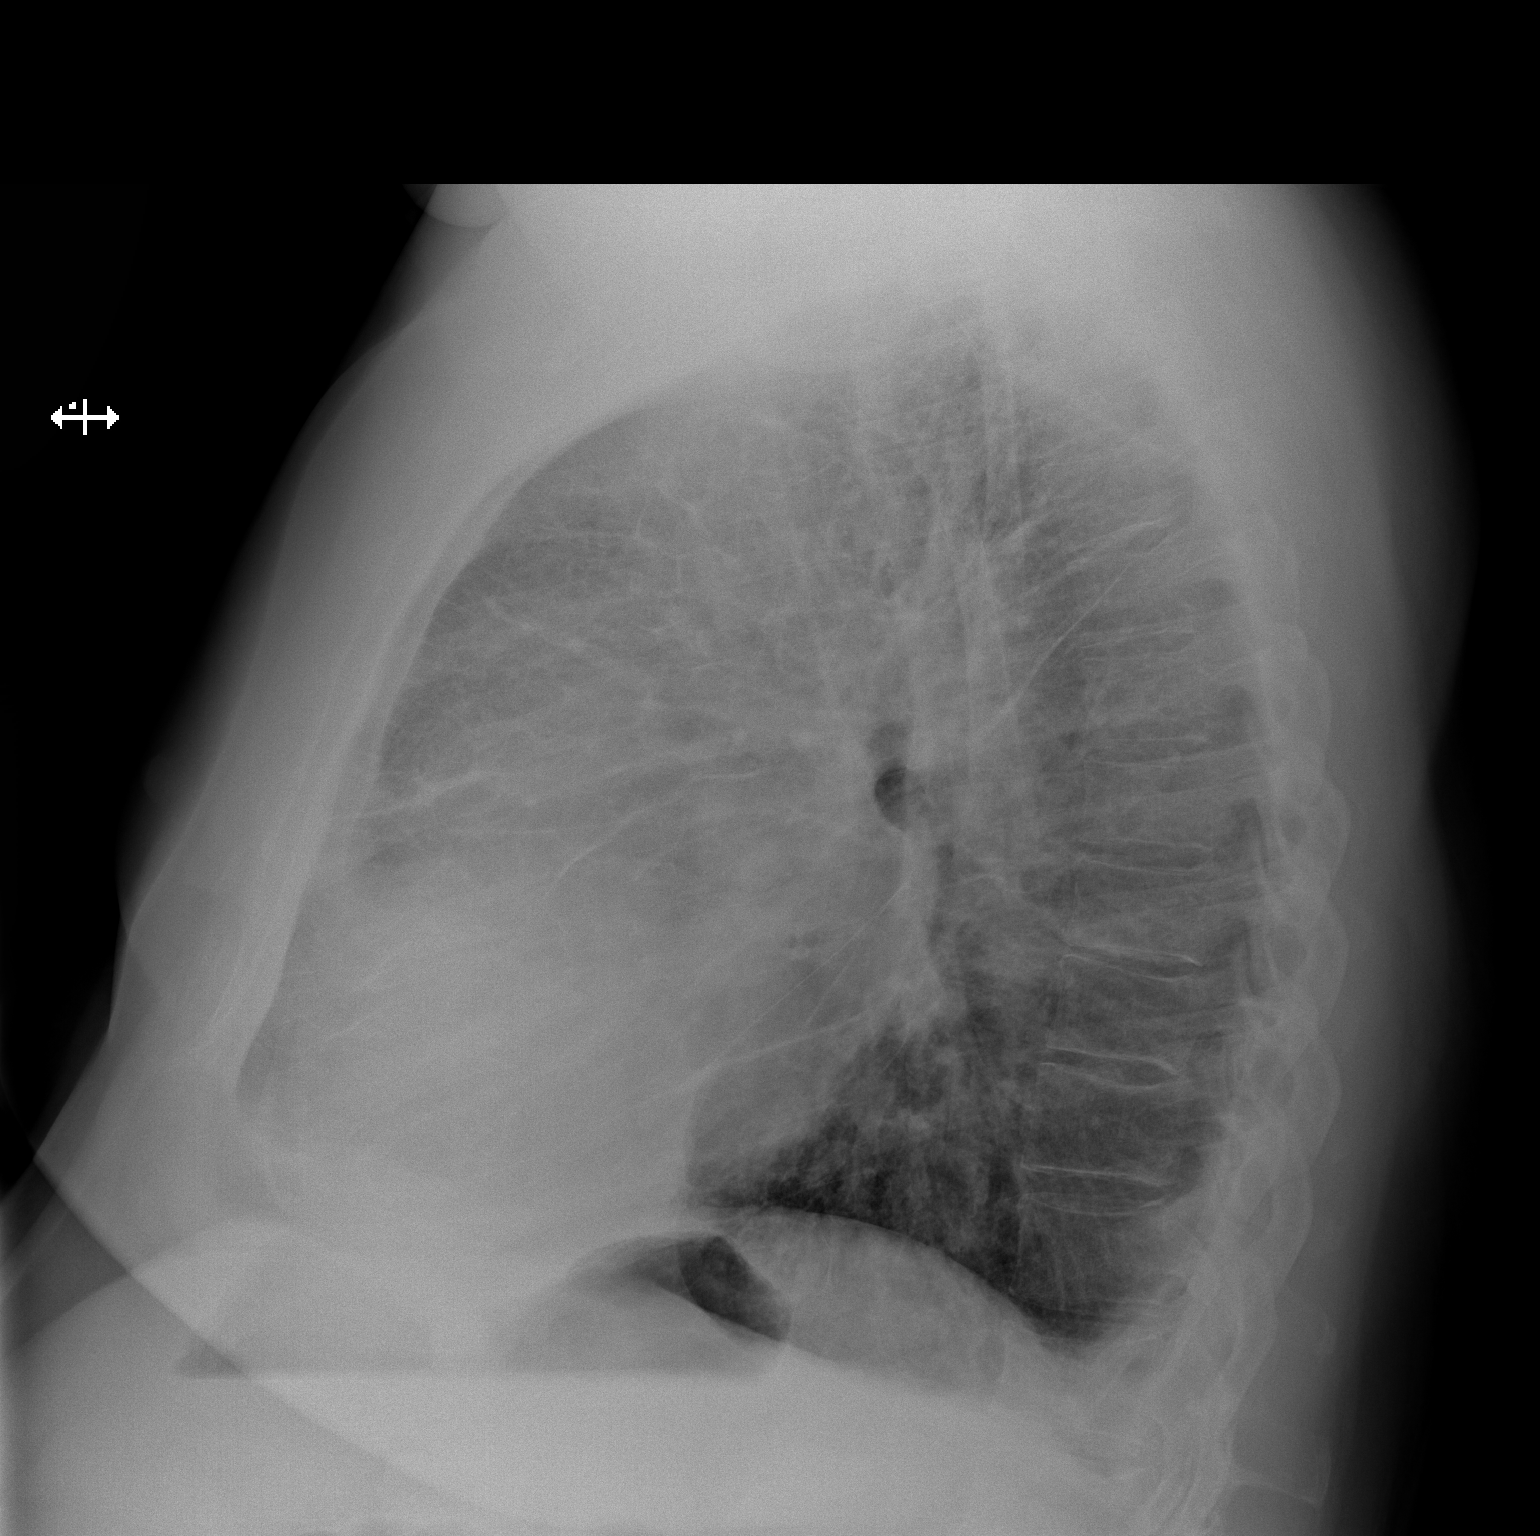

[2 of 2 positions shown; findings below may reference images not displayed]

FINDINGS: The heart size and mediastinal contours are within normal limits.
Pulmonary vascular congestion with mild upper lobe predominant
interstitial thickening. Chronic blunting/scarring of the left
costophrenic angle. No focal consolidation, pleural effusion, or
pneumothorax. No acute osseous abnormality. Unchanged right
subclavian stent.
IMPRESSION: 1. Mild pulmonary vascular congestion and interstitial edema.

## 2023-10-10 ENCOUNTER — Ambulatory Visit: Payer: 59 | Admitting: Podiatry

## 2023-10-10 ENCOUNTER — Encounter: Payer: Self-pay | Admitting: Podiatry

## 2023-10-10 VITALS — Ht 71.0 in | Wt 297.0 lb

## 2023-10-10 DIAGNOSIS — B351 Tinea unguium: Secondary | ICD-10-CM

## 2023-10-10 DIAGNOSIS — E1149 Type 2 diabetes mellitus with other diabetic neurological complication: Secondary | ICD-10-CM

## 2023-10-10 DIAGNOSIS — M79674 Pain in right toe(s): Secondary | ICD-10-CM | POA: Diagnosis not present

## 2023-10-10 DIAGNOSIS — M79675 Pain in left toe(s): Secondary | ICD-10-CM | POA: Diagnosis not present

## 2023-10-10 NOTE — Progress Notes (Signed)
This patient returns to my office for at risk foot care.  This patient requires this care by a professional since this patient will be at risk due to having uncontrolled diabetes and kidney transplant.  This patient is unable to cut nails himself since the patient cannot reach his nails.These nails are painful walking and wearing shoes.  Resolved heel fissure.   This patient presents for at risk foot care today.  General Appearance  Alert, conversant and in no acute stress.  Vascular  Dorsalis pedis and posterior tibial  pulses are palpable  bilaterally.  Capillary return is within normal limits  bilaterally. Temperature is within normal limits  bilaterally.  Neurologic  Senn-Weinstein monofilament wire test diminished   bilaterally. Muscle power within normal limits bilaterally.  Nails Thick disfigured discolored nails with subungual debris  from hallux to fifth toes bilaterally. No evidence of bacterial infection or drainage bilaterally.  Orthopedic  No limitations of motion  feet .  No crepitus or effusions noted.  No bony pathology or digital deformities noted. HAV  B/L.  Skin  normotropic skin with no porokeratosis noted bilaterally.  No signs of infections or ulcers noted.     Onychomycosis  Pain in right toes  Pain in left toes   Consent was obtained for treatment procedures.   Mechanical debridement of nails 1-5  bilaterally performed with a nail nipper.  Filed with dremel without incident.     Return office visit  3 months                   Told patient to return for periodic foot care and evaluation due to potential at risk complications.   Gregory Mayer DPM  

## 2023-10-11 ENCOUNTER — Encounter: Payer: Self-pay | Admitting: Podiatry

## 2023-10-14 ENCOUNTER — Ambulatory Visit (INDEPENDENT_AMBULATORY_CARE_PROVIDER_SITE_OTHER): Payer: 59 | Admitting: Endocrinology

## 2023-10-14 ENCOUNTER — Encounter: Payer: Self-pay | Admitting: Endocrinology

## 2023-10-14 VITALS — BP 124/80 | HR 85 | Resp 20 | Ht 71.0 in | Wt 300.0 lb

## 2023-10-14 DIAGNOSIS — E782 Mixed hyperlipidemia: Secondary | ICD-10-CM

## 2023-10-14 DIAGNOSIS — E039 Hypothyroidism, unspecified: Secondary | ICD-10-CM

## 2023-10-14 DIAGNOSIS — E291 Testicular hypofunction: Secondary | ICD-10-CM

## 2023-10-14 DIAGNOSIS — Z794 Long term (current) use of insulin: Secondary | ICD-10-CM

## 2023-10-14 DIAGNOSIS — E1165 Type 2 diabetes mellitus with hyperglycemia: Secondary | ICD-10-CM

## 2023-10-14 MED ORDER — TESTOSTERONE CYPIONATE 200 MG/ML IJ SOLN: 120.0000 mg | INTRAMUSCULAR | 0 refills | Status: AC

## 2023-10-14 MED ORDER — HUMULIN R U-500 (CONCENTRATED) 500 UNIT/ML ~~LOC~~ SOLN: 125.0000 [IU] | SUBCUTANEOUS | 3 refills | Status: DC

## 2023-10-14 MED ORDER — NOVOLOG FLEXPEN 100 UNIT/ML ~~LOC~~ SOPN: 5.0000 [IU] | PEN_INJECTOR | SUBCUTANEOUS | 11 refills | Status: DC | PRN

## 2023-10-14 MED ORDER — ROSUVASTATIN CALCIUM 20 MG PO TABS: 20.0000 mg | ORAL_TABLET | Freq: Every day | ORAL | 3 refills | Status: DC

## 2023-10-14 NOTE — Progress Notes (Signed)
 Outpatient Endocrinology Note Robert Feria, MD  10/15/23  Patient's Name: Robert [name]    DOB: May 09, 1961    MRN: [id number]                                                    REASON OF VISIT: Follow up for type 2 diabetes mellitus  PCP: [name], Charlie ORN, MD  HISTORY OF PRESENT ILLNESS:   Robert [name] is a 63 y.o. old male with past medical history listed below, is here for follow up of type 2 diabetes mellitus /hypothyroidism /hypogonadism.   Pertinent Diabetes History: Patient was diagnosed with type 2 diabetes mellitus in 2012.  He was initially treated with oral hypoglycemic drugs like glyburide but could not take metformin because of renal dysfunction.  He had been on Victoza  and later started on insulin  Lantus  and then U-500 insulin .  Later started on insulin  pump, OmniPod with U-500 insulin .  Chronic Diabetes Complications : Retinopathy: no. Last ophthalmology exam was done on annually reportedly. Nephropathy: yes, CKD IV, ESRD s/p renal transplant in 2018.  Peripheral neuropathy: yes, on gabapentin  , following with podiatry regularly. Coronary artery disease: no Stroke: no  Relevant comorbidities and cardiovascular risk factors: Obesity: yes Body mass index is 41.84 kg/m.  Hypertension: yes Hyperlipidemia. Yes, on statin.   Current / Home Diabetic regimen includes:  OmniPod Dash with U-500 insulin   Insulin  Pump setting:  Basal ( total 19.05) MN- 0.15u/hour 6AM- 1.65  11AM- 0.9 20M- 0.3  Bolus CHO Ratio (1unit:CHO) MN- 1:15 5PM -   1:13.5 10PM-  1:15  Correction/Sensitivity: MN- 1:50  Target: 130   Active insulin  time: 6 hours  Has not been on the OmniPod 5 because of the cost of the Dexcom  Prior diabetic medications: Tried Ozempic  could not tolerate.  CONTINUOUS GLUCOSE MONITORING SYSTEM (CGMS) / INSULIN  PUMP INTERPRETATION:                         Omnipod DASH Pump & Sensor Download (Reviewed and summarized below.)  Average total  daily insulin :  18.7 units, Basal: 84%%, Bolus: 16%.  Glycemic data:    CONTINUOUS GLUCOSE MONITORING SYSTEM (CGMS) INTERPRETATION: At today's visit, we reviewed CGM downloads. The full report is scanned in the media.  FreeStyle Libre 3 CGM-  Sensor Download [employer] was reviewed and summarized below.) Dates: September 30, 2025, 2025, 14 days  Sensor Average: 218  Glucose Management Indicator: 8.5%    Interpretation: Variable blood sugar with frequent hyperglycemia after blood sugar 300-350s range related with late and no meal bolus from the pump.  He had a rare hypoglycemia especially on December 30 with blood sugar in 60s related with using NovoLog  for correcting hyperglycemia by subcutaneous injection.  Possible other times acceptable blood sugar in between the meals and overnight.  No significant hypoglycemia.  Hypoglycemia: Patient has  hypoglycemic episodes. Patient has hypoglycemia awareness.    Factors modifying glucose control: 1.  Diabetic diet assessment: 2-3 meals/ day, snacks at times.   2.  Staying active or exercising: no  3.  Medication compliance: compliant most of the time.  # Hypothyroidism : Longstanding hypothyroidism has been on thyroid  hormone replacement levothyroxine  every morning, taking 112 mcg daily.  # Hypogonadism : He has hypogonadotropic hypogonadism related to his ? metabolic syndrome.  When testosterone  level was low he was having fatigue and decreased motivation and was prescribed with testosterone  supplement.  He was initially on AndroGel  since February 2019 which was subsequently stopped due to cost.  He is currently on Depo-Testosterone  cypionate 120 mg weekly since February 2023.  He does feel better when taking testosterone  injections.  Pretreatment free testosterone  level was low at 3.7.  Interval history  Pump and CGM data as reviewed above.  Frequent postprandial hyperglycemia.  He has complaints of numbness and ting of the feet.   Denies vision problem.  He has been taking levothyroxine .  He reports he has not been fully compliant with testosterone  injection for about a month.  No other complaints today.  A1c recently 7.7%.  REVIEW OF SYSTEMS As per history of present illness.   PAST MEDICAL HISTORY: Past Medical History:  Diagnosis Date   Acute cystitis without hematuria 11/09/2021   AKI (acute kidney injury) 08/08/2017   Elevated sCr to 2.5 (baseline ~1.7) noted at OSH - Subsequent transfer to [facility]  - Currently on IVMF in the setting of diarrhea and AKI - sCr at baseline at time of discharge   Anemia in chronic kidney disease 03/30/2015   Anticoagulated on Coumadin  11/09/2021   Aortic valve stenosis    Arthritis    Atrial fibrillation 11/29/2021   Benign essential hypertension 07/30/2014   prior to transplant pt took Metorpolol tartrate 50mg  qd on off HD days   Benign neoplasm of transverse colon    Cancer    skin ca right shoulder, plastic dsyplasia, pre-Ca polpys removed on Colonoscopy- 07/2014   Cerebrovascular accident (CVA) due to embolism of precerebral artery    Chronic renal failure    Colon polyps 07/23/2014   Tubular adenomas x 5   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   Diverticulosis of colon without hemorrhage    Eczema    ESRD (end stage renal disease)    hemodialysis 06/2014-02/2017, s/p living unrelated kidney transplant 02/26/17   Exertional dyspnea    Fatigue 01/08/2014   Fissure of skin 03/08/2020   GI bleed 07/26/2017   Headache    Heart murmur    aortic stenosis (moderate-severe 04/2021)   History of COVID-19 2023   History of unilateral nephrectomy 09/06/2013   Hydrocele, acquired 04/07/2017   Hyperlipidemia 09/06/2013   Hypertensive urgency 10/21/2021    Hyponatremia 11/09/2021   Hypothyroidism    Immunosuppressed status 02/26/2017   Induction agent: Solumedrol - Envarsus  3mg  daily  - Home dose decreased from 5mg  to 3mg  in the setting of FK 15.7 while inpatient   - Previously on Prograf , though d/c'ed during previous admission; suspected this may be causing hallucinations  - Cellcept  1000mg  Q12H  - Prednisone  5mg  daily   Inguinal hernia without obstruction or gangrene 06/30/2017   Right inguinal wall defect appreciated by CT imaging [place] and on exam Dr. Jaquelyn today Dr. Jaquelyn has discussed hernia repair with mesh at some point in near future  Last Assessment & Plan:  Formatting of this note might be different from the original. Right inguinal wall defect appreciated by CT imaging [place] and on exam Dr. Jaquelyn today Dr. Jaquelyn has discussed hern   Kidney transplanted 02/26/2017   Date of Transplant = 02/26/17  ABO (  Donor/Recipient) = compatible Blood type: O+/A+ DonorType: Living donor, unrelated Allograft type:Left kidney Donor anatomy: 2 arteries, 1 veins and single ureter  Pants-to-skirt anastomosis of dual renal arteries on the backtable. Donor Kidney Bx: Not applicable Allograft injury/complications: None Ne   Lower extremity edema 09/06/2013   Macular degeneration    Mild vascular neurocognitive disorder 02/08/2022   OAB (overactive bladder) 12/28/2019   Obstructive sleep apnea 09/06/2013   On Bi-PAP  Last Assessment & Plan:  Formatting of this note might be different from the original. - History of OSA, continue CPAP at night   Peritoneal dialysis catheter in place 02/24/2015   RLQ abdominal pain 04/19/2020   S/P AVR (aortic valve replacement) 10/05/2021   SBO (small bowel obstruction) 05/2015   Scrotal edema 03/14/2017   Testicular ultrasound did show hydrocele  Will refer to urology regarding ongoign swelling, hydrocele.   Last Assessment & Plan:  Formatting of this note might be different from the original.   Testicular ultrasound  did show hydrocele  Will refer to urology regarding ongoign swelling, hydrocele.   Shortness of breath    with exertion   Stage 3b chronic kidney disease (CKD) 03/22/2014   SVT (supraventricular tachycardia) 09/06/2013   Tremor of both hands 06/29/2017   Last Assessment & Plan:  Present since transplant, stable and unchanged Query from tacrolimus  -Improved tremors noted and verbalized by pt - Prograf  doses titrated per FK levels  Last Assessment & Plan:  Formatting of this note might be different from the original. Present since transplant, stable and unchanged Query from tacrolimus  -Improved tremors noted and verbalized by pt - Prograf  doses titr   Type 2 diabetes mellitus with hyperglycemia, with long-term current use of insulin  07/14/2015   Umbilical hernia    s/p repair 04/28/15    PAST SURGICAL HISTORY: Past Surgical History:  Procedure Laterality Date   AORTIC VALVE REPLACEMENT N/A 10/05/2021   Procedure: AORTIC VALVE REPLACEMENT (AVR) WITH INSPIRIS RESILIA AORTIC VALVE SIZE ;  Surgeon: Lucas Dorise POUR, MD;  [facility] OR;  Service: Open Heart Surgery;  Laterality: N/A;   AV FISTULA PLACEMENT Right 02/25/2015   Procedure: RIGHT ARTERIOVENOUS (AV) FISTULA CREATION;  Surgeon: Gaile LELON New, MD;  Location: [facility] OR;  Service: Vascular;  Laterality: Right;   AV FISTULA PLACEMENT Right 09/15/2015   Procedure: ARTERIOVENOUS (AV) FISTULA CREATION- RIGHT ARM;  Surgeon: Gaile LELON New, MD;  Location: [facility] OR;  Service: Vascular;  Laterality: Right;   CAPD INSERTION N/A 05/18/2014   Procedure: LAPAROSCOPIC INSERTION CONTINUOUS AMBULATORY PERITONEAL DIALYSIS  (CAPD) CATHETER;  Surgeon: Lynda Leos, MD;  Location: [facility] OR;  Service: General;  Laterality: N/A;   COLONOSCOPY     COLONOSCOPY WITH PROPOFOL  N/A 07/12/2016   Procedure: COLONOSCOPY WITH PROPOFOL ;  Surgeon: Toribio SHAUNNA Cedar, MD;  Location: [facility] ENDOSCOPY;  Service: Endoscopy;  Laterality: N/A;   CORONARY ARTERY BYPASS GRAFT N/A 10/05/2021    Procedure: CORONARY ARTERY BYPASS GRAFTING (CABG) X 1, ON PUMP, USING LEFT INTERNAL MAMMARY ARTERY CONDUIT;  Surgeon: Lucas Dorise POUR, MD;  Location: [facility] OR;  Service: Open Heart Surgery;  Laterality: N/A;   declotting of fistula  08/2015   ESOPHAGOGASTRODUODENOSCOPY (EGD) WITH PROPOFOL  N/A 07/12/2016   Procedure: ESOPHAGOGASTRODUODENOSCOPY (EGD) WITH PROPOFOL ;  Surgeon: Toribio SHAUNNA Cedar, MD;  Location: [facility] ENDOSCOPY;  Service: Endoscopy;  Laterality: N/A;   FISTULA SUPERFICIALIZATION Right 02/07/2016   Procedure: RIIGHT UPPER ARM FISTULA SUPERFICIALIZATION;  Surgeon: Carlin FORBES Haddock, MD;  Location: [facility] OR;  Service: Vascular;  Laterality:  Right;   IJ catheter insertion     INSERTION OF DIALYSIS CATHETER Right 02/25/2015   Procedure: INSERTION OF RIGHT INTERNAL JUGULAR DIALYSIS CATHETER;  Surgeon: Gaile LELON New, MD;  Location: [facility] OR;  Service: Vascular;  Laterality: Right;   KIDNEY TRANSPLANT     LAPAROSCOPIC REPOSITIONING CAPD CATHETER N/A 06/16/2014   Procedure: LAPAROSCOPIC REPOSITIONING CAPD CATHETER;  Surgeon: Lynda Leos, MD;  Location: [facility] OR;  Service: General;  Laterality: N/A;   LAPAROSCOPY N/A 05/04/2015   Procedure: LAPAROSCOPY DIAGNOSTIC LYSIS OF ADHESIONS;  Surgeon: Vicenta Poli, MD;  Location: [facility] OR;  Service: General;  Laterality: N/A;   MINOR REMOVAL OF PERITONEAL DIALYSIS CATHETER N/A 04/28/2015   Procedure:  REMOVAL OF PERITONEAL DIALYSIS CATHETER;  Surgeon: Vicenta Poli, MD;  Location: [facility] OR;  Service: General;  Laterality: N/A;   NEPHRECTOMY Left 1974   RENAL BIOPSY Right 2012   REVISON OF ARTERIOVENOUS FISTULA Right 05/26/2015   Procedure: SUPERFICIALIZATION OF ARTERIOVENOUS FISTULA WITH SIDE BRANCH LIGATIONS;  Surgeon: Gaile LELON New, MD;  Location: [facility] OR;  Service: Vascular;  Laterality: Right;   RIGHT/LEFT HEART CATH AND CORONARY ANGIOGRAPHY N/A 08/22/2021   Procedure: RIGHT/LEFT HEART CATH AND CORONARY ANGIOGRAPHY;  Surgeon: Burnard Debby LABOR, MD;  Location: [facility]  INVASIVE CV LAB;  Service: Cardiovascular;  Laterality: N/A;   SKIN CANCER EXCISION     right shoulder   SVT ABLATION N/A 11/23/2016   Procedure: SVT Ablation;  Surgeon: Will Gladis Norton, MD;  [facility]: [facility] INVASIVE CV LAB;  Service: Cardiovascular;  Laterality: N/A;   TEE WITHOUT CARDIOVERSION N/A 10/05/2021   Procedure: TRANSESOPHAGEAL ECHOCARDIOGRAM (TEE);  Surgeon: Lucas Dorise POUR, MD;  Location: [name] OR;  Service: Open Heart Surgery;  Laterality: N/A;   UMBILICAL HERNIA REPAIR N/A 05/18/2014   Procedure: HERNIA REPAIR UMBILICAL ADULT;  Surgeon: Lynda Leos, MD;  Location: [facility] OR;  Service: General;  Laterality: N/A;   UMBILICAL HERNIA REPAIR N/A 04/28/2015   Procedure: UMBILICAL HERNIA REPAIR WITH MESH;  Surgeon: Vicenta Poli, MD;  Location: [facility] OR;  Service: General;  Laterality: N/A;    ALLERGIES: No Known Allergies  FAMILY HISTORY:  Family History  Problem Relation Age of Onset   Colon polyps Father    Stroke Father    Dementia Father        vascular   Colon cancer Neg Hx    Stomach cancer Neg Hx     SOCIAL HISTORY: Social History   Socioeconomic History   Marital status: Married    Spouse name: Not on file   Number of children: 3   Years of education: 16   Highest education level: Bachelor's degree (e.g., BA, AB, BS)  Occupational History   Occupation: English As A Second Language Teacher: [employer]  Tobacco Use   Smoking status: Former    Types: Cigars    Quit date: 12/06/2012    Years since quitting: 10.8   Smokeless tobacco: Never  Vaping Use   Vaping status: Never Used  Substance and Sexual Activity   Alcohol  use: Not Currently   Drug use: No   Sexual activity: Not on file  Other Topics Concern   Not on file  Social History Narrative   Left handed    Lives with wife   Social Drivers of Health   Financial Resource Strain: Low Risk  (09/18/2023)   Received from [facility]   Overall Financial Resource Strain (CARDIA)    Difficulty of Paying  Living Expenses: Not hard at all  Food  Insecurity: No Food Insecurity (09/18/2023)   Received from [facility]   Hunger Vital Sign    Worried About Running Out of Food in the Last Year: Never true    Ran Out of Food in the Last Year: Never true  Transportation Needs: No Transportation Needs (09/18/2023)   Received from [facility] - Transportation    In the past 12 months, has lack of transportation kept you from medical appointments or from getting medications?: No    Lack of Transportation (Non-Medical): No  Physical Activity: Not on file  Stress: Not on file  Social Connections: Unknown (02/17/2022)   Received from [facility]   Social Network    Social Network: Not on file    MEDICATIONS:  Current Outpatient Medications  Medication Sig Dispense Refill   insulin  aspart (NOVOLOG  FLEXPEN) 100 UNIT/ML FlexPen Inject 5-10 Units into the skin as needed for high blood sugar. For hyperglycemia despite bolus via pump. 15 mL 11   Insulin  Disposable Pump (OMNIPOD DASH INTRO, GEN 4,) KIT by Does not apply route.     acetaminophen  (TYLENOL ) 325 MG tablet Take 2 tablets (650 mg total) by mouth every 4 (four) hours as needed for moderate pain.     albuterol  (VENTOLIN  HFA) 108 (90 Base) MCG/ACT inhaler Inhale 1 puff into the lungs. Every hour as needed     amLODipine  (NORVASC ) 5 MG tablet TAKE 1 AND 1/2 TABLETS DAILY BY MOUTH 135 tablet 3   apixaban  (ELIQUIS ) 5 MG TABS tablet Take 1 tablet (5 mg total) by mouth 2 (two) times daily. 180 tablet 3   B-D UF III MINI PEN NEEDLES 31G X 5 MM MISC USE TO INJECT INSULIN  4 TIMES A DAY 360 each 2   belatacept  (NULOJIX ) 250 MG SOLR injection Inject 24.5 mLs into the vein every 28 (twenty-eight) days.      carvedilol  (COREG ) 25 MG tablet Take 1 tablet (25 mg total) by mouth 2 (two) times daily with a meal.     Continuous Glucose Sensor (FREESTYLE LIBRE 3 SENSOR) MISC SMARTSIG:1 TOPICAL EVERY 2 WEEKS  6 each 2   DULoxetine  HCl 20 MG CSDR Take 30 mg by mouth daily.     gabapentin  (NEURONTIN ) 100 MG capsule Take 200-300 mg by mouth See admin instructions. Taking 200 mg in the AM and 300 mg in the evening     hydrALAZINE  (APRESOLINE ) 100 MG tablet TAKE 1 TABLET BY MOUTH EVERY 8 HOURS 90 tablet 3   insulin  regular human CONCENTRATED (HUMULIN  R) 500 UNIT/ML injection Inject 0.25 mLs (125 Units total) into the skin every 3 (three) days. Omnipod 20 mL 3   levothyroxine  (SYNTHROID ) 112 MCG tablet TAKE 1 TABLET BY MOUTH EVERY DAY 90 tablet 3   mycophenolate  (CELLCEPT ) 250 MG capsule Take 500 mg by mouth every 12 (twelve) hours.     NEEDLE, DISP, 18 G (BD HYPODERMIC NEEDLE) 18G X 1 MISC USE AS DIRECTED TO INJECT 0.6MLS OF TESTOSTERONE  EVERY 7 DAYS (DRAW UP) 100 each 1   nitroGLYCERIN  (NITROSTAT ) 0.4 MG SL tablet Place 0.4 mg under the tongue every 5 (five) minutes as needed for chest pain.     NON FORMULARY CPAP/BIPAP  at bedtime  BIPAP per pt     potassium chloride  SA (KLOR-CON  M20) 20 MEQ tablet Take half a tablet , Potassium 10 meq, for 3 days with 20 mg Lasix  as prescribed.  Then call office for new instructions. (Patient taking differently:  20 mEq 2 (two) times daily. Take 1 tablet TWICE DAILY) 90 tablet 3   predniSONE  (DELTASONE ) 5 MG tablet Take 5 mg by mouth daily with breakfast. Continuously     rosuvastatin  (CRESTOR ) 20 MG tablet Take 1 tablet (20 mg total) by mouth daily. 90 tablet 3   senna-docusate (SENOKOT-S) 8.6-50 MG tablet Take 1 tablet by mouth at bedtime as needed for mild constipation. (Patient taking differently: Take 1 tablet by mouth every other day.)     Spironolactone  25 MG/5ML SUSP Take 25 mg by mouth daily.     Syringe, Disposable, 1 ML MISC Use to draw up 0.1ml of testosterone  medication 25 each 0   SYRINGE-NEEDLE, DISP, 3 ML (BD PLASTIPAK SYRINGE) 21G X 1 3 ML MISC USE AS DIRECTED TO INJECT 0.6MLS OF TESTOSTERONE  EVERY 7 DAYS (INJECT) 100 each 1   tamsulosin  (FLOMAX ) 0.4 MG  CAPS capsule Take 0.8 mg by mouth daily after supper.     Testosterone  Cypionate 200 MG/ML SOLN Inject 120 mg as directed once a week. Saturday 10 mL 0   torsemide (DEMADEX) 20 MG tablet Take 20 mg by mouth 2 (two) times daily.     traMADol  (ULTRAM ) 50 MG tablet 1 tablet Oral Once a day for 90 days     traZODone  (DESYREL ) 50 MG tablet 1 tablet at bedtime as needed Orally Once a day (Do not take if take ambien )     zolpidem  (AMBIEN ) 10 MG tablet Take 10 mg by mouth at bedtime as needed for sleep.     zonisamide  (ZONEGRAN ) 25 MG capsule TAKE 3 CAPSULES (75 MG TOTAL) BY MOUTH DAILY 270 capsule 1   No current facility-administered medications for this visit.    PHYSICAL EXAM: Vitals:   10/14/23 1345  BP: 124/80  Pulse: 85  Resp: 20  SpO2: 97%  Weight: 300 lb (136.1 kg)  Height: 5' 11 (1.803 m)   Body mass index is 41.84 kg/m.  Wt Readings from Last 3 Encounters:  10/14/23 300 lb (136.1 kg)  10/10/23 297 lb (134.7 kg)  08/27/23 297 lb (134.7 kg)    General: Well developed, well nourished male in no apparent distress.  HEENT: AT/[place], no external lesions.  Eyes: Conjunctiva clear and no icterus. Neck: Neck supple  Lungs: Respirations not labored Neurologic: Alert, oriented, normal speech Extremities / Skin: Dry. Psychiatric: Does not appear depressed or anxious  Diabetic Foot Exam - Simple   Simple Foot Form Visual Inspection See comments: Yes Sensation Testing See comments: Yes Pulse Check See comments: Yes Comments DP palpable bilaterally. Callus and dystrophic nails bilaterally. No ulcer.  Monofilament absent bilaterally.     LABS Reviewed Lab Results  Component Value Date   HGBA1C 7.7 (H) 09/27/2023   HGBA1C 7.9 (H) 07/01/2023   HGBA1C 7.6 (H) 04/29/2023   Lab Results  Component Value Date   FRUCTOSAMINE 280 10/29/2022   FRUCTOSAMINE 295 (H) 11/28/2021   FRUCTOSAMINE 296 (H) 07/04/2021   Lab Results  Component Value Date   CHOL 116 05/02/2022   HDL 52  05/02/2022   LDLCALC 55 05/02/2022   TRIG 45 05/02/2022   CHOLHDL 2.2 05/02/2022   Lab Results  Component Value Date   MICRALBCREAT 201.8 (H) 08/03/2019   MICRALBCREAT 503.9 (H) 01/04/2014   Lab Results  Component Value Date   CREATININE 2.57 (H) 09/27/2023   Lab Results  Component Value Date   GFR 26.95 (L) 07/01/2023    ASSESSMENT / PLAN  1. Uncontrolled type 2 diabetes mellitus  with hyperglycemia, with long-term current use of insulin  (HCC)   2. Mixed hyperlipidemia   3. Acquired hypothyroidism   4. Hypogonadism male     Diabetes Mellitus type 2, complicated by diabetic nephropathy and neuropathy. - Diabetic status / severity: Uncontrolled.  Lab Results  Component Value Date   HGBA1C 7.7 (H) 09/27/2023    - Hemoglobin A1c goal <7%   Discussed about compliance with bolusing for all meals.  Discussed about importance of diabetes control.  - Medications: No change in the pump settings today. Insulin  pump setting changed as follows:  - Home glucose testing: continue CGM and check blood glucose as needed.  - Discussed/ Gave Hypoglycemia treatment plan.  # Consult : not required at this time.   # CKD 4, ESRD status post renal transplant in 2018, following with nephrology. Last  Lab Results  Component Value Date   MICRALBCREAT 201.8 (H) 08/03/2019    # Foot check nightly / neuropathy, continue gabapentin , following with podiatry/neurology.  # Annual dilated diabetic eye exams.   - Diet: Make healthy diabetic food choices  2. Blood pressure  -  BP Readings from Last 1 Encounters:  10/14/23 124/80    - Control is in target.  - No change in current plans.  3. Lipid status / Hyperlipidemia - Last  Lab Results  Component Value Date   LDLCALC 55 05/02/2022   - Continue rosuvastatin  20 mg daily.  # Hypothyroidism : Continue levothyroxine  112 mcg daily.  # Hypogonadism : Continue testosterone  cypionate 120 mg weekly.  Diagnoses and all orders for  this visit:  Uncontrolled type 2 diabetes mellitus with hyperglycemia, with long-term current use of insulin  (HCC) -     BASIC METABOLIC PANEL WITH GFR -     Hemoglobin A1c -     Lipid panel  Mixed hyperlipidemia -     rosuvastatin  (CRESTOR ) 20 MG tablet; Take 1 tablet (20 mg total) by mouth daily. -     Lipid panel  Acquired hypothyroidism  Hypogonadism male  Other orders -     Testosterone  Cypionate 200 MG/ML SOLN; Inject 120 mg as directed once a week. Saturday -     insulin  regular human CONCENTRATED (HUMULIN  R) 500 UNIT/ML injection; Inject 0.25 mLs (125 Units total) into the skin every 3 (three) days. Omnipod -     insulin  aspart (NOVOLOG  FLEXPEN) 100 UNIT/ML FlexPen; Inject 5-10 Units into the skin as needed for high blood sugar. For hyperglycemia despite bolus via pump.     DISPOSITION Follow up in clinic in 3  months suggested.  Labs prior to follow-up visit.   All questions answered and patient verbalized understanding of the plan.  Adalina Dopson, MD [facility] Endocr[facility] [address] [place], [address] [place], [place] [address] Phone # [phone]  At least part of this note was generated using voice recognition software. Inadvertent word errors may have occurred, which were not recognized during the proofreading process.

## 2023-11-03 ENCOUNTER — Other Ambulatory Visit: Payer: Self-pay | Admitting: Cardiovascular Disease

## 2023-12-04 ENCOUNTER — Telehealth: Payer: Self-pay | Admitting: Pharmacy Technician

## 2023-12-04 ENCOUNTER — Encounter (HOSPITAL_COMMUNITY): Payer: Self-pay

## 2023-12-04 ENCOUNTER — Other Ambulatory Visit (HOSPITAL_COMMUNITY): Payer: Self-pay

## 2023-12-04 NOTE — Telephone Encounter (Signed)
 Pharmacy Patient Advocate Encounter   Received notification from CoverMyMeds that prior authorization for Insulin Aspart FlexPen 100UNIT/ML pen-injectors is required/requested.   Insurance verification completed.   The patient is insured through [employer] .   Per test claim: Ins covers Novolog brand. Called pharmacy to have them process the claim for Novolog instead of Insulin Aspart.

## 2023-12-13 NOTE — Progress Notes (Signed)
 NEUROLOGY FOLLOW UP OFFICE NOTE  [name] [id number]  Assessment/Plan:   Headaches, improved Mild vascular neurocognitive disorder Bilateral vestibular hypofunction History of post-operative left greater than right bilateral embolic infarcts s/p CABG and AVR Lumbar radiculopathy, right-sided Hypertension Type 2 diabetes mellitus ESRD s/p renal transplant, immunocompromised       Zonisamide 75mg  at bedtime  Secondary stroke prevention as managed by cardiology and PCP: Eliquis Statin.  LDL goal less than 70 Normotensive blood pressure Hgb A1c goal less than 7 Follow up in 6-7 months.    Subjective:  JONNIE TRUXILLO is a 63 year old left-handed male with CAD, DM II, aortic valve stenosis, ESRD s/p renal transplant with CKD3b, hypothyroidism, HTN, sleep apnea and paroxysmal atrial tachycardia and PSVT who follows up for headache and stroke.   UPDATE: Current medications:  Zonisamide 75mg  daily, Eliquis, rosuvastatin, duloxetine 30mg  daily, gabapentin 200mg  AM and 300mg  PM, amlodipine, carvedilol, doxazosin, clonidine, furosemide, HCTZ, hydralazine, duloxetine, prednisone, Cellcept, tramadol  Has not had a recent headache.  He does occasionally get an ocular migraine without headache, described as seeing a semicircular scotoma with checkered pattern in the right periphery lasting 5 minutes.    Still without persistent dizziness but may have brief episodes with double vision lasting 15-30 minutes, responds to self-vestibular exercises.  No double vision since last time.  No longer with persistent dizziness but   Sleeps well with either trazodone, Ambien or Benadryl    HISTORY: Following CABG x1 and aortic valve replacement  on 10/05/2021, he noted that he was unable to lift his right leg.  He has history of right lower extremity weakness and pain following renal transplant in 2018 but his leg was now almost plegic.  CT head negative for acute abnormality.  MRI of  brain on 10/11/2021 revealed scattered acute and subacute small infarcts in multiple vascular territories including left cerebellar hemisphere, left MCA territory and bilateral PCA territory.  MRA of head and neck demonstrated chronic occluded V4 segment of the right vertebral artery as well as moderate stenosis of the right ACA A2.  The intra-operative TEE on 12/29 revealed no evidence of thrombus.  He did develop post-operative valvular a fib and was started on warfarin.  To evaluate for possible radiculopathy, he also had MRI of lumbar spine which revealed multilevel degenerative disc and endplate disease with mild spinal and left lateral recess stenosis at L1-2, moderate right neural foraminal stenosis at L3 and bilateral L4 nerve levels and moderate to severe neural foraminal stenosis at the bilateral L5 nerve levels.  He was discharged to SNF.  He was readmitted to the hospital on 10/21/2021 for hypertensive urgency presenting as dizziness and nausea.  Blood pressure was 204/119.  CT head again demonstrated known subacute embolic infarcts but no new acute findings.  He had to be hospitalized again on 11/08/2021 for   pyelonephritis and UTI.    Since the stroke, he had been experiencing headaches and dizziness.  Headaches are severe throbbing pain from left eye radiating to both temples.  Sometimes nausea but no photophobia, phonophobia or visual disturbance.  Occurs every other day.  Treats with tramadol and Tylenol.  Lasts 30 minutes with treatment, otherwise 1.5 hours.  Takes tramadol and Tylenol daily.  Dizziness is persistent but will have episodes of increased severity lasting 15 minutes every 2 days.  He underwent vestibular evaluation by physical therapy.  Oculomotor exam revealed saccades with superior smooth pursuits, undershooting with saccadic testing, slow and guarded VOR, positive  HIT right greater than left, and negative positional testing.    Since the stroke, he has had episode of vertical  double vision for several seconds.  He had worsening symptoms on 11/24/2021 while working with OT/PT.  Still has right leg weakness.  Arm is improved.  He has trouble ambulating.  He was seen in the ED where etiology was thought to be due to hypoglycemia.  CT head and follow up MRI of brain showed no acute findings.  Since the stroke, he has trouble remembering events that had occurred around that time.  Underwent neuropsychological evaluation on 02/08/2022 consistent with primarily mild neurocognitive disorder in conjunction with underlying anxiety, chronic pain (neuropathy) and sleep dysfunction.  When he gets stressed out, he continues to sometimes experience develop double vision.  On 10/15/2022, he developed dizziness, skewed diplopia and right sided numbness.  He was seen in ED where MRI of brain and MRA head and neck personally reviewed revealed no changes.  He was found to have a UTI and treated accordingly.  His blood sugar was low, in the 40s.  Sometimes he still drags his right leg.    Admitted to hospital on 04/30/2022 for UTI when he developed sudden onset of subjective bilateral facial numbness and right arm numbness and weakness.  Ongoing chronic dizziness and headache which is not new.  Other than known right leg weakness, no new objective focal deficits noted.  Stroke code initiated.  CT and subsequent MRI of head showed no acute stroke.  MRA head and neck revealed no LVO or hemodynamically significant stenosis.  Reported 8 mm left ICA terminus outpouching likely artifact due to motion.  2D echo showed EF 60-65%.  LDL 65.  Hgb A1c 7.4.  No change in secondary stroke prevention made in regards to antithrombotic therapy and statin- -advised to continue Eliquis and Crestor.  To follow up on possible cerebral aneurysm, he had a repeat MRA of head on 06/01/2022 which again demonstrated chronic occlusion of the right vertebral artery beyond the PICA origin but no aneurysm.    Past medications: nortriptyline  (side effects)  PAST MEDICAL HISTORY: Past Medical History:  Diagnosis Date   Acute cystitis without hematuria 11/09/2021   AKI (acute kidney injury) 08/08/2017   Elevated sCr to 2.5 (baseline ~1.7) noted at OSH - Subsequent transfer to [facility]  - Currently on IVMF in the setting of diarrhea and AKI - sCr at baseline at time of discharge   Anemia in chronic kidney disease 03/30/2015   Anticoagulated on Coumadin 11/09/2021   Aortic valve stenosis    Arthritis    Atrial fibrillation 11/29/2021   Benign essential hypertension 07/30/2014   prior to transplant pt took Metorpolol tartrate 50mg  qd on off HD days   Benign neoplasm of transverse colon    Cancer    skin ca right shoulder, plastic dsyplasia, pre-Ca polpys removed on Colonoscopy- 07/2014   Cerebrovascular accident (CVA) due to embolism of precerebral artery    Chronic renal failure    Colon polyps 07/23/2014   Tubular adenomas x 5   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   Diverticulosis of colon without hemorrhage    Eczema    ESRD (end stage renal disease)    hemodialysis 06/2014-02/2017, s/p living unrelated kidney transplant  02/26/17   Exertional dyspnea    Fatigue 01/08/2014   Fissure of skin 03/08/2020   GI bleed 07/26/2017   Headache    Heart murmur    aortic stenosis (moderate-severe 04/2021)   History of COVID-19 2023   History of unilateral nephrectomy 09/06/2013   Hydrocele, acquired 04/07/2017   Hyperlipidemia 09/06/2013   Hypertensive urgency 10/21/2021   Hyponatremia 11/09/2021   Hypothyroidism    Immunosuppressed status 02/26/2017   Induction agent: Solumedrol - Envarsus 3mg  daily  - Home dose decreased from 5mg  to 3mg  in the setting of FK 15.7 while inpatient   - Previously on  Prograf, though d/c'ed during previous admission; suspected this may be causing hallucinations  - Cellcept 1000mg  Q12H  - Prednisone 5mg  daily   Inguinal hernia without obstruction or gangrene 06/30/2017   Right inguinal wall defect appreciated by CT imaging [place] and on exam Dr. Roby Lofts today Dr. Roby Lofts has discussed hernia repair with mesh at some point in near future  Last Assessment & Plan:  Formatting of this note might be different from the original. Right inguinal wall defect appreciated by CT imaging  and on exam Dr. Roby Lofts today Dr. Roby Lofts has discussed hern   Kidney transplanted 02/26/2017   Date of Transplant = 02/26/17  ABO (Donor/Recipient) = compatible Blood type: O+/A+ DonorType: Living donor, unrelated Allograft type:Left kidney Donor anatomy: 2 arteries, 1 veins and single ureter  Pants-to-skirt anastomosis of dual renal arteries on the backtable. Donor Kidney Bx: Not applicable Allograft injury/complications: None Ne   Lower extremity edema 09/06/2013   Macular degeneration    Mild vascular neurocognitive disorder 02/08/2022   OAB (overactive bladder) 12/28/2019   Obstructive sleep apnea 09/06/2013   On Bi-PAP  Last Assessment & Plan:  Formatting of this note might be different from the original. - History of OSA, continue CPAP at night   Peritoneal dialysis catheter in place 02/24/2015   RLQ abdominal pain 04/19/2020   S/P AVR (aortic valve replacement) 10/05/2021   SBO (small bowel obstruction) 05/2015   Scrotal edema 03/14/2017   Testicular ultrasound did show hydrocele  Will refer to urology regarding ongoign swelling, hydrocele.   Last Assessment & Plan:  Formatting of this note might be different from the original.   Testicular ultrasound did show hydrocele  Will refer to urology regarding ongoign swelling, hydrocele.   Shortness of breath    with exertion   Stage 3b chronic kidney disease (CKD) 03/22/2014   SVT (supraventricular tachycardia) 09/06/2013   Tremor  of both hands 06/29/2017   Last Assessment & Plan:  Present since transplant, stable and unchanged Query from tacrolimus -Improved tremors noted and verbalized by pt - Prograf doses titrated per FK levels  Last Assessment & Plan:  Formatting of this note might be different from the original. Present since transplant, stable and unchanged Query from tacrolimus -Improved tremors noted and verbalized by pt - Prograf doses titr   Type 2 diabetes mellitus with hyperglycemia, with long-term current use of insulin 07/14/2015   Umbilical hernia    s/p repair 04/28/15    MEDICATIONS: Current Outpatient Medications on File Prior to Visit  Medication Sig Dispense Refill   acetaminophen (TYLENOL) 325 MG tablet Take 2 tablets (650 mg total) by mouth every 4 (four) hours as needed for moderate pain.     albuterol (VENTOLIN HFA) 108 (90 Base) MCG/ACT inhaler Inhale 1 puff into the lungs. Every hour as needed     amLODipine (NORVASC) 5 MG tablet  TAKE 1 AND 1/2 TABLETS DAILY BY MOUTH 135 tablet 3   apixaban (ELIQUIS) 5 MG TABS tablet Take 1 tablet (5 mg total) by mouth 2 (two) times daily. 180 tablet 3   B-D UF III MINI PEN NEEDLES 31G X 5 MM MISC USE TO INJECT INSULIN 4 TIMES A DAY 360 each 2   belatacept (NULOJIX) 250 MG SOLR injection Inject 24.5 mLs into the vein every 28 (twenty-eight) days.      carvedilol (COREG) 25 MG tablet Take 1 tablet (25 mg total) by mouth 2 (two) times daily with a meal.     Continuous Glucose Sensor (FREESTYLE LIBRE 3 SENSOR) MISC SMARTSIG:1 TOPICAL EVERY 2 WEEKS 6 each 2   DULoxetine HCl 20 MG CSDR Take 30 mg by mouth daily.     gabapentin (NEURONTIN) 100 MG capsule Take 200-300 mg by mouth See admin instructions. Taking 200 mg in the AM and 300 mg in the evening     hydrALAZINE (APRESOLINE) 100 MG tablet TAKE 1 TABLET BY MOUTH EVERY 8 HOURS 270 tablet 1   insulin aspart (NOVOLOG FLEXPEN) 100 UNIT/ML FlexPen Inject 5-10 Units into the skin as needed for high blood sugar. For  hyperglycemia despite bolus via pump. 15 mL 11   Insulin Disposable Pump (OMNIPOD DASH INTRO, GEN 4,) KIT by Does not apply route.     insulin regular human CONCENTRATED (HUMULIN R) 500 UNIT/ML injection Inject 0.25 mLs (125 Units total) into the skin every 3 (three) days. Omnipod 20 mL 3   levothyroxine (SYNTHROID) 112 MCG tablet TAKE 1 TABLET BY MOUTH EVERY DAY 90 tablet 3   mycophenolate (CELLCEPT) 250 MG capsule Take 500 mg by mouth every 12 (twelve) hours.     NEEDLE, DISP, 18 G (BD HYPODERMIC NEEDLE) 18G X 1" MISC USE AS DIRECTED TO INJECT 0.6MLS OF TESTOSTERONE EVERY 7 DAYS (DRAW UP) 100 each 1   nitroGLYCERIN (NITROSTAT) 0.4 MG SL tablet Place 0.4 mg under the tongue every 5 (five) minutes as needed for chest pain.     NON FORMULARY CPAP/BIPAP  at bedtime  BIPAP per pt     potassium chloride SA (KLOR-CON M20) 20 MEQ tablet Take half a tablet , Potassium 10 meq, for 3 days with 20 mg Lasix as prescribed.  Then call office for new instructions. (Patient taking differently: 20 mEq 2 (two) times daily. Take 1 tablet TWICE DAILY) 90 tablet 3   predniSONE (DELTASONE) 5 MG tablet Take 5 mg by mouth daily with breakfast. Continuously     rosuvastatin (CRESTOR) 20 MG tablet Take 1 tablet (20 mg total) by mouth daily. 90 tablet 3   senna-docusate (SENOKOT-S) 8.6-50 MG tablet Take 1 tablet by mouth at bedtime as needed for mild constipation. (Patient taking differently: Take 1 tablet by mouth every other day.)     Spironolactone 25 MG/5ML SUSP Take 25 mg by mouth daily.     Syringe, Disposable, 1 ML MISC Use to draw up 0.16ml of testosterone medication 25 each 0   SYRINGE-NEEDLE, DISP, 3 ML (BD PLASTIPAK SYRINGE) 21G X 1" 3 ML MISC USE AS DIRECTED TO INJECT 0.6MLS OF TESTOSTERONE EVERY 7 DAYS (INJECT) 100 each 1   tamsulosin (FLOMAX) 0.4 MG CAPS capsule Take 0.8 mg by mouth daily after supper.     Testosterone Cypionate 200 MG/ML SOLN Inject 120 mg as directed once a week. Saturday 10 mL 0   torsemide  (DEMADEX) 20 MG tablet Take 20 mg by mouth 2 (two) times daily.  traMADol (ULTRAM) 50 MG tablet 1 tablet Oral Once a day for 90 days     traZODone (DESYREL) 50 MG tablet 1 tablet at bedtime as needed Orally Once a day (Do not take if take ambien)     zolpidem (AMBIEN) 10 MG tablet Take 10 mg by mouth at bedtime as needed for sleep.     zonisamide (ZONEGRAN) 25 MG capsule TAKE 3 CAPSULES (75 MG TOTAL) BY MOUTH DAILY 270 capsule 1   No current facility-administered medications on file prior to visit.    ALLERGIES: No Known Allergies  FAMILY HISTORY: Family History  Problem Relation Age of Onset   Colon polyps Father    Stroke Father    Dementia Father        vascular   Colon cancer Neg Hx    Stomach cancer Neg Hx       Objective:  Blood pressure 100/60, pulse 68, height 5\' 11"  (1.803 m), weight 295 lb (133.8 kg), SpO2 98%. General: No acute distress.  Patient appears well-groomed.   Head:  Normocephalic/atraumatic Eyes:  Fundi examined but not visualized Neck: supple, no paraspinal tenderness, full range of motion Heart:  Regular rate and rhythm Neurological Exam: alert and oriented.  Speech fluent and not dysarthric, language intact.  CN II-XII intact. Bulk and tone normal, muscle strength 5/5 throughout.  Sensation to light touch intact.  Deep tendon reflexes absent throughout, toes downgoing.  Finger to nose testing intact.  Gait normal, Romberg negative.   Shon Millet, DO  CC: Guerry Bruin, MD

## 2023-12-16 ENCOUNTER — Ambulatory Visit: Payer: 59 | Admitting: Neurology

## 2023-12-16 ENCOUNTER — Encounter: Payer: Self-pay | Admitting: Neurology

## 2023-12-16 VITALS — BP 100/60 | HR 68 | Ht 71.0 in | Wt 295.0 lb

## 2023-12-16 DIAGNOSIS — R519 Headache, unspecified: Secondary | ICD-10-CM

## 2023-12-16 DIAGNOSIS — G43109 Migraine with aura, not intractable, without status migrainosus: Secondary | ICD-10-CM

## 2023-12-16 DIAGNOSIS — H832X3 Labyrinthine dysfunction, bilateral: Secondary | ICD-10-CM

## 2023-12-16 DIAGNOSIS — I999 Unspecified disorder of circulatory system: Secondary | ICD-10-CM | POA: Diagnosis not present

## 2023-12-16 DIAGNOSIS — F0670 Mild neurocognitive disorder due to known physiological condition without behavioral disturbance: Secondary | ICD-10-CM

## 2023-12-16 DIAGNOSIS — I6310 Cerebral infarction due to embolism of unspecified precerebral artery: Secondary | ICD-10-CM

## 2023-12-16 MED ORDER — ZONISAMIDE 25 MG PO CAPS: 75.0000 mg | ORAL_CAPSULE | Freq: Every day | ORAL | 1 refills | Status: DC

## 2023-12-16 NOTE — Patient Instructions (Signed)
 Continue zonisamide 75mg  daily

## 2023-12-31 ENCOUNTER — Other Ambulatory Visit: Payer: Self-pay

## 2024-01-08 ENCOUNTER — Encounter: Payer: Self-pay | Admitting: Podiatry

## 2024-01-08 ENCOUNTER — Ambulatory Visit: Payer: 59 | Admitting: Podiatry

## 2024-01-08 ENCOUNTER — Other Ambulatory Visit: Payer: 59

## 2024-01-08 DIAGNOSIS — E1149 Type 2 diabetes mellitus with other diabetic neurological complication: Secondary | ICD-10-CM

## 2024-01-08 DIAGNOSIS — N189 Chronic kidney disease, unspecified: Secondary | ICD-10-CM

## 2024-01-08 DIAGNOSIS — B351 Tinea unguium: Secondary | ICD-10-CM

## 2024-01-08 DIAGNOSIS — M79675 Pain in left toe(s): Secondary | ICD-10-CM

## 2024-01-08 DIAGNOSIS — M79674 Pain in right toe(s): Secondary | ICD-10-CM

## 2024-01-08 NOTE — Progress Notes (Signed)
 This patient returns to my office for at risk foot care.  This patient requires this care by a professional since this patient will be at risk due to having uncontrolled diabetes and kidney transplant.  This patient is unable to cut nails himself since the patient cannot reach his nails.These nails are painful walking and wearing shoes.  Resolved heel fissure.   This patient presents for at risk foot care today.  General Appearance  Alert, conversant and in no acute stress.  Vascular  Dorsalis pedis and posterior tibial  pulses are palpable  bilaterally.  Capillary return is within normal limits  bilaterally. Temperature is within normal limits  bilaterally.  Neurologic  Senn-Weinstein monofilament wire test diminished   bilaterally. Muscle power within normal limits bilaterally.  Nails Thick disfigured discolored nails with subungual debris  from hallux to fifth toes bilaterally. No evidence of bacterial infection or drainage bilaterally.  Orthopedic  No limitations of motion  feet .  No crepitus or effusions noted.  No bony pathology or digital deformities noted. HAV  B/L.  Skin  normotropic skin with no porokeratosis noted bilaterally.  No signs of infections or ulcers noted.     Onychomycosis  Pain in right toes  Pain in left toes   Consent was obtained for treatment procedures.   Mechanical debridement of nails 1-5  bilaterally performed with a nail nipper.  Filed with dremel without incident.  Subungual hematoma fourth toenail left foot.  Bandage/DSD.   Return office visit  3 months                   Told patient to return for periodic foot care and evaluation due to potential at risk complications.   Helane Gunther DPM

## 2024-01-09 ENCOUNTER — Encounter: Payer: Self-pay | Admitting: Endocrinology

## 2024-01-09 LAB — LIPID PANEL
Cholesterol: 114 mg/dL (ref <200)
HDL: 45 mg/dL (ref >=40)
LDL Cholesterol (Calc): 52 mg/dL
Non-HDL Cholesterol (Calc): 69 mg/dL (ref <130)
Total CHOL/HDL Ratio: 2.5 (calc) (ref <5.0)
Triglycerides: 85 mg/dL (ref <150)

## 2024-01-09 LAB — BASIC METABOLIC PANEL WITH GFR
BUN/Creatinine Ratio: 17 (calc) (ref 6–22)
BUN: 42 mg/dL — ABNORMAL HIGH (ref 7–25)
CO2: 23 mmol/L (ref 20–32)
Calcium: 10.2 mg/dL (ref 8.6–10.3)
Chloride: 105 mmol/L (ref 98–110)
Creat: 2.43 mg/dL — ABNORMAL HIGH (ref 0.70–1.35)
Glucose, Bld: 184 mg/dL — ABNORMAL HIGH (ref 65–99)
Potassium: 4.7 mmol/L (ref 3.5–5.3)
Sodium: 138 mmol/L (ref 135–146)
eGFR: 29 mL/min/{1.73_m2} — ABNORMAL LOW (ref >=60)

## 2024-01-09 LAB — HEMOGLOBIN A1C
Hgb A1c MFr Bld: 8.8 %{Hb} — ABNORMAL HIGH (ref <5.7)
Mean Plasma Glucose: 206 mg/dL
eAG (mmol/L): 11.4 mmol/L

## 2024-01-14 ENCOUNTER — Encounter: Payer: Self-pay | Admitting: Endocrinology

## 2024-01-14 ENCOUNTER — Ambulatory Visit: Payer: 59 | Admitting: Endocrinology

## 2024-01-14 VITALS — BP 114/78 | HR 70 | Ht 71.0 in | Wt 295.0 lb

## 2024-01-14 DIAGNOSIS — Z794 Long term (current) use of insulin: Secondary | ICD-10-CM

## 2024-01-14 DIAGNOSIS — E1165 Type 2 diabetes mellitus with hyperglycemia: Secondary | ICD-10-CM | POA: Diagnosis not present

## 2024-01-14 DIAGNOSIS — E039 Hypothyroidism, unspecified: Secondary | ICD-10-CM

## 2024-01-14 DIAGNOSIS — E291 Testicular hypofunction: Secondary | ICD-10-CM

## 2024-01-14 MED ORDER — SYRINGE 23G X 1" 3 ML MISC: 1 refills | Status: DC

## 2024-01-14 MED ORDER — TIRZEPATIDE 5 MG/0.5ML ~~LOC~~ SOAJ: 5.0000 mg | SUBCUTANEOUS | 3 refills | Status: DC

## 2024-01-14 MED ORDER — TIRZEPATIDE 2.5 MG/0.5ML ~~LOC~~ SOAJ: SUBCUTANEOUS | 0 refills | Status: DC

## 2024-01-14 NOTE — Patient Instructions (Signed)
 Mounjaro 2.5 mg weekly for 4 weeks and increase to 5 mg weekly.  Tirzepatide Injection What is this medication? TIRZEPATIDE (tir ZEP a tide) treats type 2 diabetes. It works by increasing insulin levels in your body, which decreases your blood sugar (glucose). It also reduces the amount of sugar released into your blood and slows down your digestion. Changes to diet and exercise are often combined with this medication. This medicine may be used for other purposes; ask your health care provider or pharmacist if you have questions. COMMON BRAND NAME(S): MOUNJARO What should I tell my care team before I take this medication? They need to know if you have any of these conditions: Eye disease caused by diabetes Gallbladder disease Have or have had pancreatitis Having surgery Kidney disease Personal or family history of MEN 2, a condition that causes endocrine gland tumors Personal or family history of thyroid cancer Stomach or intestine problems, such as problems digesting food An unusual or allergic reaction to tirzepatide, other medications, foods, dyes, or preservatives Pregnant or trying to get pregnant Breastfeeding How should I use this medication? This medication is injected under the skin. You will be taught how to prepare and give it. It is given once every week (every 7 days). Keep taking it unless your health care provider tells you to stop. If you use this medication with insulin, you should inject this medication and the insulin separately. Do not mix them together. Do not give the injections right next to each other. Change (rotate) injection sites with each injection. This medication comes with INSTRUCTIONS FOR USE. Ask your pharmacist for directions on how to use this medication. Read the information carefully. Talk to your pharmacist or care team if you have questions. It is important that you put your used needles and syringes in a special sharps container. Do not put them in a  trash can. If you do not have a sharps container, call your pharmacist or care team to get one. A special MedGuide will be given to you by the pharmacist with each prescription and refill. Be sure to read this information carefully each time. Talk to your care team about the use of this medication in children. Special care may be needed. Overdosage: If you think you have taken too much of this medicine contact a poison control center or emergency room at once. NOTE: This medicine is only for you. Do not share this medicine with others. What if I miss a dose? If you miss a dose, take it as soon as you can unless it is more than 4 days (96 hours) late. If it is more than 4 days late, skip the missed dose. Take the next dose at the normal time. Do not take 2 doses within 3 days of each other. What may interact with this medication? Alcohol Antiviral medications for HIV or AIDS Aspirin and aspirin-like medications Beta blockers, such as atenolol, metoprolol, propranolol Certain medications for blood pressure, heart disease, irregular heart beat Chromium Clonidine Diuretics Estrogen or progestin hormones Fenofibrate Gemfibrozil Guanethidine Isoniazid Lanreotide Male hormones or anabolic steroids MAOIs, such as Marplan, Nardil, and Parnate Medications for weight loss Medications for allergies, asthma, cold, or cough Medications for depression, anxiety, or mental health conditions Niacin Nicotine NSAIDs, medications for pain and inflammation, such as ibuprofen or naproxen Octreotide Other medications for diabetes, such as glyburide, glipizide, or glimepiride Pasireotide Pentamidine Phenytoin Probenecid Quinolone antibiotics, such as ciprofloxacin, levofloxacin, ofloxacin Reserpine Some herbal dietary supplements Steroid medications, such as prednisone  or cortisone Sulfamethoxazole; trimethoprim Thyroid hormones Warfarin This list may not describe all possible interactions. Give  your health care provider a list of all the medicines, herbs, non-prescription drugs, or dietary supplements you use. Also tell them if you smoke, drink alcohol, or use illegal drugs. Some items may interact with your medicine. What should I watch for while using this medication? Visit your care team for regular checks on your progress. Tell your care team if your symptoms do not start to get better or if they get worse. You may need blood work done while you are taking this medication. Your care team will monitor your HbA1C (A1C). This test shows what your average blood sugar (glucose) level was over the past 2 to 3 months. Know the symptoms of low blood sugar and know how to treat it. Always carry a source of quick sugar with you. Examples include hard sugar candy or glucose tablets. Make sure others know that you can choke if you eat or drink if your blood sugar is too low and you are unable to care for yourself. Get medical help at once. Tell your care team if you have high blood sugar. Your medication dose may change if your body is under stress. Some types of stress that may affect your blood sugar include fever, infection, and surgery. Do not share pens or cartridges with anyone, even if the needle is changed. Each pen should only be used by one person. Sharing could cause an infection. Wear a medical ID bracelet or chain. Carry a card that describes your condition. List the medications and doses you take on the card. Talk to your care team about your risk of cancer. You may be more at risk for certain types of cancer if you take this medication. Talk to your care team right away if you have a lump or swelling in your neck, hoarseness that does not go away, trouble swallowing, shortness of breath, or trouble breathing. Make sure you stay hydrated while taking this medication. Drink water often. Eat fruits and veggies that have a high water content. Drink more water when it is hot or you are active.  Talk to your care team right away if you have fever, infection, vomiting, diarrhea, or if you sweat a lot while taking this medication. The loss of too much body fluid may make it dangerous for you to take this medication. If you are going to need surgery or a procedure, tell your care team that you are taking this medication. Estrogen and progestin hormones that you take by mouth may not work as well while you are taking this medication. Switch to a non-oral contraceptive or add a barrier contraceptive for 4 weeks after starting this medication and after each dose increase. Talk to your care team about contraceptive options. They can help you find the option that works for you. What side effects may I notice from receiving this medication? Side effects that you should report to your care team as soon as possible: Allergic reactions or angioedema--skin rash, itching or hives, swelling of the face, eyes, lips, tongue, arms, or legs, trouble swallowing or breathing Change in vision Dehydration--increased thirst, dry mouth, feeling faint or lightheaded, headache, dark yellow or brown urine Gallbladder problems--severe stomach pain, nausea, vomiting, fever Kidney injury--decrease in the amount of urine, swelling of the ankles, hands, or feet Pancreatitis--severe stomach pain that spreads to your back or gets worse after eating or when touched, fever, nausea, vomiting Thoughts of suicide or  self-harm, worsening mood, feelings of depression Thyroid cancer--new mass or lump in the neck, pain or trouble swallowing, trouble breathing, hoarseness Side effects that usually do not require medical attention (report these to your care team if they continue or are bothersome): Diarrhea Loss of appetite Nausea Upset stomach This list may not describe all possible side effects. Call your doctor for medical advice about side effects. You may report side effects to FDA at 1-800-FDA-1088. Where should I keep my  medication? Keep out of the reach of children and pets. Refrigeration (preferred): Store in the refrigerator. Keep this medication in the original carton until you are ready to take it. Do not freeze. Protect from light. Get rid of opened vials after use, even if there is medication left. Get rid of any unopened vials or pens after the expiration date. Room Temperature: This medication may be stored at room temperature below 30 degrees C (86 degrees F) for up to 21 days. Keep this medication in the original carton until you are ready to take it. Protect from light. Avoid exposure to extreme heat. Get rid of opened vials after use, even if there is medication left. Get rid of any unopened vials or pens after 21 days, or after they expire, whichever is first. To get rid of medications that are no longer needed or have expired: Take the medication to a medication take-back program. Check with your pharmacy or law enforcement to find a location. If you cannot return the medication, ask your pharmacist or care team how to get rid of this medication safely. NOTE: This sheet is a summary. It may not cover all possible information. If you have questions about this medicine, talk to your doctor, pharmacist, or health care provider.  2024 [employer] (2023-09-06 00:00:00)

## 2024-01-14 NOTE — Progress Notes (Unsigned)
 Outpatient Endocrinology Note [place] Kashay Cavenaugh, MD  01/14/24  Patient's Name: Robert [name]    DOB: 04/30/1961    MRN: [id number]                                                    REASON OF VISIT: Follow up for type 2 diabetes mellitus  PCP: [name], Adelfa Koh, MD  HISTORY OF PRESENT ILLNESS:   Robert [name] is a 63 y.o. old male with past medical history listed below, is here for follow up of type 2 diabetes mellitus /hypothyroidism /hypogonadism.   Pertinent Diabetes History: Patient was diagnosed with type 2 diabetes mellitus in 2012.  He was initially treated with oral hypoglycemic drugs like glyburide but could not take metformin because of renal dysfunction.  He had been on Victoza and later started on insulin Lantus and then U-500 insulin.  Later started on insulin pump, OmniPod with U-500 insulin.  Chronic Diabetes Complications : Retinopathy: no. Last ophthalmology exam was done on annually reportedly. Nephropathy: yes, CKD IV, ESRD s/p renal transplant in 2018.  Peripheral neuropathy: yes, on gabapentin , following with podiatry regularly. Coronary artery disease: no Stroke: no  Relevant comorbidities and cardiovascular risk factors: Obesity: yes Body mass index is 41.14 kg/m.  Hypertension: yes Hyperlipidemia. Yes, on statin.   Current / Home Diabetic regimen includes:  OmniPod Dash with U-500 insulin  Insulin Pump setting: Previous settings. Basal ( total 19.05) MN- 0.15u/hour 6AM- 1.65  11AM- 0.9 27M- 0.3  Bolus CHO Ratio (1unit:CHO) MN- 1:15 5PM -   1:13.5 10PM-  1:15  Correction/Sensitivity: MN- 1:50  Target: 130   Active insulin time: 6 hours  Has not been on the OmniPod 5 because of the cost of the Dexcom  Prior diabetic medications: Tried Ozempic could not tolerate.  CONTINUOUS GLUCOSE MONITORING SYSTEM (CGMS) / INSULIN PUMP INTERPRETATION:                         Omnipod DASH Pump & Sensor Download (Reviewed and summarized  below.)  Not able to download pump in the clinic today, stating reviewed directly from the pump PDM.  Glycemic data:    CONTINUOUS GLUCOSE MONITORING SYSTEM (CGMS) INTERPRETATION: At today's visit, we reviewed CGM downloads. The full report is scanned in the media.  FreeStyle Libre 3 + CGM-  Sensor Download (Sensor download was reviewed and summarized below.) Dates: March 26 to January 14, 2024, 14 days  Sensor Average: 199 Glucose Management Indicator: 8.1%    Interpretation: Frequent hyperglycemia postprandially related with meals with blood sugar in the 300 range especially in the evening and bedtime.  He also has hypoglycemia around lunchtime.  Some of the days acceptable blood sugar.  Episode of hypoglycemia in the afternoon on March 27 after use of correctional insulin.  No other concerning/frequent hypoglycemia.  Hypoglycemia: Patient has  hypoglycemic episodes. Patient has hypoglycemia awareness.    Factors modifying glucose control: 1.  Diabetic diet assessment: 2-3 meals/ day, snacks at times.   2.  Staying active or exercising: no  3.  Medication compliance: compliant most of the time.  # Hypothyroidism : Longstanding hypothyroidism has been on thyroid hormone replacement levothyroxine every morning, taking 112 mcg daily.  # Hypogonadism : He has hypogonadotropic hypogonadism related to his ? metabolic syndrome.  When  testosterone level was low he was having fatigue and decreased motivation and was prescribed with testosterone supplement.  He was initially on AndroGel since February 2019 which was subsequently stopped due to cost.  He is currently on Depo-Testosterone cypionate 120 mg weekly since February 2023.  He does feel better when taking testosterone injections.  Pretreatment free testosterone level was low at 3.7.  Interval history  Pump and CGM data as reviewed above.  He has frequent postprandial hyperglycemia.  Patient has a new PDM, recorded settings as basal  rate 0.4 units/h, carb ratio 1: 13.5 and correction factor I : 70.  These settings are different than the settings in the last visit.  He has been taking levothyroxine.  He reports he has not been taking testosterone for few weeks, due to larger bore of low syringe makes soreness around the injection site.  Hemoglobin A1c 8.1%.  Recent laboratory results reviewed, stable renal function with EGFR 29, acceptable cholesterol level with LDL of 52, normal electrolytes.  REVIEW OF SYSTEMS As per history of present illness.   PAST MEDICAL HISTORY: Past Medical History:  Diagnosis Date   Acute cystitis without hematuria 11/09/2021   AKI (acute kidney injury) 08/08/2017   Elevated sCr to 2.5 (baseline ~1.7) noted at OSH - Subsequent transfer to [facility]  - Currently on IVMF in the setting of diarrhea and AKI - sCr at baseline at time of discharge   Anemia in chronic kidney disease 03/30/2015   Anticoagulated on Coumadin 11/09/2021   Aortic valve stenosis    Arthritis    Atrial fibrillation 11/29/2021   Benign essential hypertension 07/30/2014   prior to transplant pt took Metorpolol tartrate 50mg  qd on off HD days   Benign neoplasm of transverse colon    Cancer    skin ca right shoulder, plastic dsyplasia, pre-Ca polpys removed on Colonoscopy- 07/2014   Cerebrovascular accident (CVA) due to embolism of precerebral artery    Chronic renal failure    Colon polyps 07/23/2014   Tubular adenomas x 5   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   Diverticulosis of colon without hemorrhage    Eczema    ESRD (end stage renal disease)    hemodialysis 06/2014-02/2017, s/p living unrelated kidney transplant 02/26/17   Exertional dyspnea    Fatigue 01/08/2014   Fissure of skin 03/08/2020    GI bleed 07/26/2017   Headache    Heart murmur    aortic stenosis (moderate-severe 04/2021)   History of COVID-19 2023   History of unilateral nephrectomy 09/06/2013   Hydrocele, acquired 04/07/2017   Hyperlipidemia 09/06/2013   Hypertensive urgency 10/21/2021   Hyponatremia 11/09/2021   Hypothyroidism    Immunosuppressed status 02/26/2017   Induction agent: Solumedrol - Envarsus 3mg  daily  - Home dose decreased from 5mg  to 3mg  in the setting of FK 15.7 while inpatient   - Previously on Prograf, though d/c'ed during previous admission; suspected this may be causing hallucinations  - Cellcept 1000mg  Q12H  - Prednisone 5mg  daily   Inguinal hernia without obstruction or gangrene 06/30/2017   Right inguinal wall defect appreciated by CT imaging Raoul and on exam Dr. Roby Lofts today Dr. Roby Lofts has discussed hernia repair with mesh at some point in near future  Last Assessment & Plan:  Formatting of this note might be different from the original. Right inguinal wall defect appreciated by CT imaging Zena and on exam Dr. Roby Lofts today Dr. Roby Lofts has discussed hern   Kidney transplanted 02/26/2017   Date of Transplant = 02/26/17  ABO (Donor/Recipient) = compatible Blood type: O+/A+ DonorType: Living donor, unrelated Allograft type:Left kidney Donor anatomy: 2 arteries, 1 veins and single ureter  Pants-to-skirt anastomosis of dual renal arteries on the backtable. Donor Kidney Bx: Not applicable Allograft injury/complications: None Ne   Lower extremity edema 09/06/2013   Macular degeneration    Mild vascular neurocognitive disorder 02/08/2022   OAB (overactive bladder) 12/28/2019   Obstructive sleep apnea 09/06/2013   On Bi-PAP  Last Assessment & Plan:  Formatting of this note might be different from the original. - History of OSA, continue CPAP at night   Peritoneal dialysis catheter in place 02/24/2015   RLQ abdominal pain 04/19/2020   S/P AVR (aortic valve replacement) 10/05/2021   SBO (small bowel  obstruction) 05/2015   Scrotal edema 03/14/2017   Testicular ultrasound did show hydrocele  Will refer to urology regarding ongoign swelling, hydrocele.   Last Assessment & Plan:  Formatting of this note might be different from the original.   Testicular ultrasound did show hydrocele  Will refer to urology regarding ongoign swelling, hydrocele.   Shortness of breath    with exertion   Stage 3b chronic kidney disease (CKD) 03/22/2014   SVT (supraventricular tachycardia) 09/06/2013   Tremor of both hands 06/29/2017   Last Assessment & Plan:  Present since transplant, stable and unchanged Query from tacrolimus -Improved tremors noted and verbalized by pt - Prograf doses titrated per FK levels  Last Assessment & Plan:  Formatting of this note might be different from the original. Present since transplant, stable and unchanged Query from tacrolimus -Improved tremors noted and verbalized by pt - Prograf doses titr   Type 2 diabetes mellitus with hyperglycemia, with long-term current use of insulin 07/14/2015   Umbilical hernia    s/p repair 04/28/15    PAST SURGICAL HISTORY: Past Surgical History:  Procedure Laterality Date   AORTIC VALVE REPLACEMENT N/A 10/05/2021   Procedure: AORTIC VALVE REPLACEMENT (AVR) WITH INSPIRIS RESILIA AORTIC VALVE SIZE ;  Surgeon: Alleen Borne, MD;  Location: [facility] OR;  Service: Open Heart Surgery;  Laterality: N/A;   AV FISTULA PLACEMENT Right 02/25/2015   Procedure: RIGHT ARTERIOVENOUS (AV) FISTULA CREATION;  Surgeon: Nada Libman, MD;  Location: [facility] OR;  Service: Vascular;  Laterality: Right;   AV FISTULA PLACEMENT Right 09/15/2015   Procedure: ARTERIOVENOUS (AV) FISTULA CREATION- RIGHT ARM;  Surgeon: Nada Libman, MD;  Location: [facility] OR;  Service: Vascular;  Laterality: Right;   CAPD INSERTION N/A 05/18/2014   Procedure: LAPAROSCOPIC INSERTION CONTINUOUS AMBULATORY PERITONEAL DIALYSIS  (CAPD) CATHETER;  Surgeon: Axel Filler, MD;  Location: [facility] OR;  Service:  General;  Laterality: N/A;   COLONOSCOPY     COLONOSCOPY WITH PROPOFOL N/A 07/12/2016   Procedure: COLONOSCOPY WITH PROPOFOL;  Surgeon: Rachael Fee, MD;  Location: [facility];  Service: Endoscopy;  Laterality: N/A;   CORONARY ARTERY BYPASS GRAFT N/A 10/05/2021   Procedure: CORONARY ARTERY BYPASS GRAFTING (CABG) X 1, ON PUMP, USING LEFT INTERNAL MAMMARY ARTERY CONDUIT;  Surgeon: Alleen Borne, MD;  Location: [facility] OR;  Service: Open Heart Surgery;  Laterality: N/A;   declotting of fistula  08/2015   ESOPHAGOGASTRODUODENOSCOPY (EGD) WITH PROPOFOL N/A 07/12/2016   Procedure: ESOPHAGOGASTRODUODENOSCOPY (EGD) WITH PROPOFOL;  Surgeon: Rachael Fee, MD;  Location: [facility];  Service: Endoscopy;  Laterality: N/A;   FISTULA SUPERFICIALIZATION Right 02/07/2016   Procedure: RIIGHT UPPER ARM FISTULA SUPERFICIALIZATION;  Surgeon: Sherren Kerns, MD;  Location: [facility] OR;  Service: Vascular;  Laterality: Right;   IJ catheter insertion     INSERTION OF DIALYSIS CATHETER Right 02/25/2015   Procedure: INSERTION OF RIGHT INTERNAL JUGULAR DIALYSIS CATHETER;  Surgeon: Nada Libman, MD;  Location: [facility] OR;  Service: Vascular;  Laterality: Right;   KIDNEY TRANSPLANT     LAPAROSCOPIC REPOSITIONING CAPD CATHETER N/A 06/16/2014   Procedure: LAPAROSCOPIC REPOSITIONING CAPD CATHETER;  Surgeon: Axel Filler, MD;  Location: [facility] OR;  Service: General;  Laterality: N/A;   LAPAROSCOPY N/A 05/04/2015   Procedure: LAPAROSCOPY DIAGNOSTIC LYSIS OF ADHESIONS;  Surgeon: Abigail Miyamoto, MD;  Location: [facility] OR;  Service: General;  Laterality: N/A;   MINOR REMOVAL OF PERITONEAL DIALYSIS CATHETER N/A 04/28/2015   Procedure:  REMOVAL OF PERITONEAL DIALYSIS CATHETER;  Surgeon: Abigail Miyamoto, MD;  Location: [facility] OR;  Service: General;  Laterality: N/A;   NEPHRECTOMY Left 1974   RENAL BIOPSY Right 2012   REVISON OF ARTERIOVENOUS FISTULA Right 05/26/2015   Procedure: SUPERFICIALIZATION OF ARTERIOVENOUS FISTULA WITH SIDE BRANCH  LIGATIONS;  Surgeon: Nada Libman, MD;  Location: [facility] OR;  Service: Vascular;  Laterality: Right;   RIGHT/LEFT HEART CATH AND CORONARY ANGIOGRAPHY N/A 08/22/2021   Procedure: RIGHT/LEFT HEART CATH AND CORONARY ANGIOGRAPHY;  Surgeon: Lennette Bihari, MD;  [facility]: [facility];  Service: Cardiovascular;  Laterality: N/A;   SKIN CANCER EXCISION     right shoulder   SVT ABLATION N/A 11/23/2016   Procedure: SVT Ablation;  Surgeon: Will Jorja Loa, MD;  [facility];  Service: Cardiovascular;  Laterality: N/A;   TEE WITHOUT CARDIOVERSION N/A 10/05/2021   Procedure: TRANSESOPHAGEAL ECHOCARDIOGRAM (TEE);  Surgeon: Alleen Borne, MD;  Location: [facility] OR;  Service: Open Heart Surgery;  Laterality: N/A;   UMBILICAL HERNIA REPAIR N/A 05/18/2014   Procedure: HERNIA REPAIR UMBILICAL ADULT;  Surgeon: Axel Filler, MD;  Location: [facility] OR;  Service: General;  Laterality: N/A;   UMBILICAL HERNIA REPAIR N/A 04/28/2015   Procedure: UMBILICAL HERNIA REPAIR WITH MESH;  Surgeon: Abigail Miyamoto, MD;  Location: [facility] OR;  Service: General;  Laterality: N/A;    ALLERGIES: No Known Allergies  FAMILY HISTORY:  Family History  Problem Relation Age of Onset   Colon polyps Father    Stroke Father    Dementia Father        vascular   Colon cancer Neg Hx    Stomach cancer Neg Hx     SOCIAL HISTORY: Social History   Socioeconomic History   Marital status: Married    Spouse name: Not on file   Number of children: 3   Years of education: 16   Highest education level: Bachelor's degree (e.g., BA, AB, BS)  Occupational History   Occupation: English as a second language teacher: [employer]  Tobacco Use   Smoking status: Former    Types: Cigars    Quit date: 12/06/2012    Years since quitting: 11.1   Smokeless tobacco: Never  Vaping Use   Vaping status: Never Used  Substance and Sexual Activity   Alcohol use: Not Currently   Drug use: No   Sexual activity: Not on file  Other Topics Concern   Not on  file  Social History Narrative   Left handed    Lives with wife  Social Drivers of Corporate investment banker Strain: Low Risk  (09/18/2023)   Received from [facility]   Overall Financial Resource Strain (CARDIA)    Difficulty of Paying Living Expenses: Not hard at all  Food Insecurity: No Food Insecurity (09/18/2023)   Received from [facility]   Hunger Vital Sign    Worried About Running Out of Food in the Last Year: Never true    Ran Out of Food in the Last Year: Never true  Transportation Needs: No Transportation Needs (09/18/2023)   Received from [facility] - Transportation    In the past 12 months, has lack of transportation kept you from medical appointments or from getting medications?: No    Lack of Transportation (Non-Medical): No  Physical Activity: Not on file  Stress: Not on file  Social Connections: Unknown (02/17/2022)   Received from [facility]   Social Network    Social Network: Not on file    MEDICATIONS:  Current Outpatient Medications  Medication Sig Dispense Refill   acetaminophen (TYLENOL) 325 MG tablet Take 2 tablets (650 mg total) by mouth every 4 (four) hours as needed for moderate pain.     albuterol (VENTOLIN HFA) 108 (90 Base) MCG/ACT inhaler Inhale 1 puff into the lungs. Every hour as needed     amLODipine (NORVASC) 5 MG tablet TAKE 1 AND 1/2 TABLETS DAILY BY MOUTH 135 tablet 3   apixaban (ELIQUIS) 5 MG TABS tablet Take 1 tablet (5 mg total) by mouth 2 (two) times daily. 180 tablet 3   B-D UF III MINI PEN NEEDLES 31G X 5 MM MISC USE TO INJECT INSULIN 4 TIMES A DAY 360 each 2   belatacept (NULOJIX) 250 MG SOLR injection Inject 24.5 mLs into the vein every 28 (twenty-eight) days.      carvedilol (COREG) 25 MG tablet Take 1 tablet (25 mg total) by mouth 2 (two) times daily with a meal.     Continuous Glucose Sensor (FREESTYLE LIBRE 3 SENSOR) MISC SMARTSIG:1 TOPICAL  EVERY 2 WEEKS 6 each 2   DULoxetine HCl 20 MG CSDR Take 30 mg by mouth daily.     gabapentin (NEURONTIN) 100 MG capsule Take 200-300 mg by mouth See admin instructions. Taking 200 mg in the AM and 300 mg in the evening     hydrALAZINE (APRESOLINE) 100 MG tablet TAKE 1 TABLET BY MOUTH EVERY 8 HOURS 270 tablet 1   insulin aspart (NOVOLOG FLEXPEN) 100 UNIT/ML FlexPen Inject 5-10 Units into the skin as needed for high blood sugar. For hyperglycemia despite bolus via pump. 15 mL 11   Insulin Disposable Pump (OMNIPOD DASH INTRO, GEN 4,) KIT by Does not apply route.     insulin regular human CONCENTRATED (HUMULIN R) 500 UNIT/ML injection Inject 0.25 mLs (125 Units total) into the skin every 3 (three) days. Omnipod 20 mL 3   levothyroxine (SYNTHROID) 112 MCG tablet TAKE 1 TABLET BY MOUTH EVERY DAY 90 tablet 3   mycophenolate (CELLCEPT) 250 MG capsule Take 500 mg by mouth every 12 (twelve) hours.     NEEDLE, DISP, 18 G (BD HYPODERMIC NEEDLE) 18G X 1" MISC USE AS DIRECTED TO INJECT 0.6MLS OF TESTOSTERONE EVERY 7 DAYS (DRAW UP) 100 each 1   nitroGLYCERIN (NITROSTAT) 0.4 MG SL tablet Place 0.4 mg under the tongue every 5 (five) minutes as needed for chest pain.     NON FORMULARY CPAP/BIPAP  at bedtime  BIPAP per pt     potassium chloride SA (KLOR-CON M20) 20 MEQ tablet Take half a tablet , Potassium 10 meq, for 3 days with 20 mg Lasix as prescribed.  Then call office for new instructions. (Patient taking differently: 20 mEq 2 (two) times daily. Take 1 tablet TWICE DAILY) 90 tablet 3   predniSONE (DELTASONE) 5 MG tablet Take 5 mg by mouth daily with breakfast. Continuously     rosuvastatin (CRESTOR) 20 MG tablet Take 1 tablet (20 mg total) by mouth daily. 90 tablet 3   senna-docusate (SENOKOT-S) 8.6-50 MG tablet Take 1 tablet by mouth at bedtime as needed for mild constipation. (Patient taking differently: Take 1 tablet by mouth every other day.)     Spironolactone 25 MG/5ML SUSP Take 25 mg by mouth daily.      Syringe, Disposable, 1 ML MISC Use to draw up 0.13ml of testosterone medication 25 each 0   SYRINGE-NEEDLE, DISP, 3 ML (BD PLASTIPAK SYRINGE) 21G X 1" 3 ML MISC USE AS DIRECTED TO INJECT 0.6MLS OF TESTOSTERONE EVERY 7 DAYS (INJECT) 100 each 1   Syringe/Needle, Disp, (SYRINGE 3CC/23GX1") 23G X 1" 3 ML MISC Use once every week to inject testosterone 50 each 1   tamsulosin (FLOMAX) 0.4 MG CAPS capsule Take 0.8 mg by mouth daily after supper.     Testosterone Cypionate 200 MG/ML SOLN Inject 120 mg as directed once a week. Saturday 10 mL 0   tirzepatide (MOUNJARO) 2.5 MG/0.5ML Pen Inject 2.5 mg into the skin once a week for 4 weeks and then increase to 5 mg weekly. 2 mL 0   tirzepatide (MOUNJARO) 5 MG/0.5ML Pen Inject 5 mg into the skin once a week. After 4 weeks of 2.5mg  dose. 6 mL 3   torsemide (DEMADEX) 20 MG tablet Take 20 mg by mouth 2 (two) times daily.     traMADol (ULTRAM) 50 MG tablet 1 tablet Oral Once a day for 90 days     traZODone (DESYREL) 50 MG tablet 1 tablet at bedtime as needed Orally Once a day (Do not take if take ambien)     zolpidem (AMBIEN) 10 MG tablet Take 10 mg by mouth at bedtime as needed for sleep.     zonisamide (ZONEGRAN) 25 MG capsule Take 3 capsules (75 mg total) by mouth daily. 270 capsule 1   No current facility-administered medications for this visit.    PHYSICAL EXAM: Vitals:   01/14/24 1332  BP: 114/78  Pulse: 70  SpO2: 96%  Weight: 295 lb (133.8 kg)  Height: 5\' 11"  (1.803 m)   Body mass index is 41.14 kg/m.  Wt Readings from Last 3 Encounters:  01/14/24 295 lb (133.8 kg)  12/16/23 295 lb (133.8 kg)  10/14/23 300 lb (136.1 kg)    General: Well developed, well nourished male in no apparent distress.  HEENT: AT/[place], no external lesions.  Eyes: Conjunctiva clear and no icterus. Neck: Neck supple  Lungs: Respirations not labored Neurologic: Alert, oriented, normal speech Extremities / Skin: Dry. Psychiatric: Does not appear depressed or  anxious  Diabetic Foot Exam - Simple   No data filed    LABS Reviewed Lab Results  Component Value Date   HGBA1C 8.8 (H) 01/08/2024   HGBA1C 7.7 (H) 09/27/2023   HGBA1C 7.9 (H) 07/01/2023   Lab Results  Component Value Date   FRUCTOSAMINE 280 10/29/2022   FRUCTOSAMINE 295 (H) 11/28/2021   FRUCTOSAMINE 296 (H) 07/04/2021   Lab Results  Component Value Date  CHOL 114 01/08/2024   HDL 45 01/08/2024   LDLCALC 52 01/08/2024   TRIG 85 01/08/2024   CHOLHDL 2.5 01/08/2024   Lab Results  Component Value Date   MICRALBCREAT 201.8 (H) 08/03/2019   MICRALBCREAT 503.9 (H) 01/04/2014   Lab Results  Component Value Date   CREATININE 2.43 (H) 01/08/2024   Lab Results  Component Value Date   GFR 26.95 (L) 07/01/2023    ASSESSMENT / PLAN  1. Uncontrolled type 2 diabetes mellitus with hyperglycemia, with long-term current use of insulin (HCC)   2. Hypogonadism male   3. Acquired hypothyroidism      Diabetes Mellitus type 2, complicated by diabetic nephropathy and neuropathy. - Diabetic status / severity: Uncontrolled.  Lab Results  Component Value Date   HGBA1C 8.8 (H) 01/08/2024    - Hemoglobin A1c goal <7%   Discussed about compliance with bolusing for all meals.  Discussed about importance of diabetes control.  - Medications: Adjusted the pump settings today.  Currently he had quite different settings after a new PDM, mostly adjusted pump setting back to his original pump setting from the last visit. Insulin pump setting changed as follows:  OmniPod Dash with U-500 insulin  Insulin Pump setting: Previous settings. Basal ( total 19.05) MN- 0.15u/hour 6AM- 1.65  11AM- 0.9 30M- 0.3  Bolus CHO Ratio (1unit:CHO) MN- 1:14  Correction/Sensitivity: MN- 1:50  Target: 130   Active insulin time: 6 hours  -No contraindication for GLP-1 receptor agonist, no personal history of pancreatitis.  No family history of medullary cancer/MEN 2 syndrome.  -Start  Mounjaro 2.5 mg weekly for 4 weeks and increase to 5 mg weekly.  - Home glucose testing: continue CGM and check blood glucose as needed.  - Discussed/ Gave Hypoglycemia treatment plan.  # Consult : not required at this time.   # CKD 4, ESRD status post renal transplant in 2018, following with nephrology. Last  Lab Results  Component Value Date   MICRALBCREAT 201.8 (H) 08/03/2019    # Foot check nightly / neuropathy, continue gabapentin, following with podiatry/neurology.  # Annual dilated diabetic eye exams.   - Diet: Make healthy diabetic food choices  2. Blood pressure  -  BP Readings from Last 1 Encounters:  01/14/24 114/78    - Control is in target.  - No change in current plans.  3. Lipid status / Hyperlipidemia - Last  Lab Results  Component Value Date   LDLCALC 52 01/08/2024   - Continue rosuvastatin 20 mg daily.  # Hypothyroidism : Continue levothyroxine 112 mcg daily.  # Hypogonadism : Continue testosterone cypionate 120 mg weekly.  Prescribed new syringe for testosterone injection.  Robert [name] was seen today for diabetes.  Diagnoses and all orders for this visit:  Uncontrolled type 2 diabetes mellitus with hyperglycemia, with long-term current use of insulin (HCC) -     tirzepatide (MOUNJARO) 2.5 MG/0.5ML Pen; Inject 2.5 mg into the skin once a week for 4 weeks and then increase to 5 mg weekly. -     tirzepatide [facility]) 5 MG/0.5ML Pen; Inject 5 mg into the skin once a week. After 4 weeks of 2.5mg  dose. -     Basic Metabolic Panel Without GFR -     Hemoglobin A1c  Hypogonadism male -     Syringe/Needle, Disp, (SYRINGE 3CC/23GX1") 23G X 1" 3 ML MISC; Use once every week to inject testosterone -     Testosterone, Total, LC/MS/MS  Acquired hypothyroidism -     T4,  free -     TSH   DISPOSITION Follow up in clinic in 3  months suggested.  Labs prior to follow-up visit as ordered.   All questions answered and patient verbalized understanding of the  plan.  Iraq Sherell Christoffel, MD [facility] Endocrinology [facility] [address] [place], [address] [place], [place] [address] Phone # [phone]  At least part of this note was generated using voice recognition software. Inadvertent word errors may have occurred, which were not recognized during the proofreading process.

## 2024-01-15 ENCOUNTER — Encounter: Payer: Self-pay | Admitting: Endocrinology

## 2024-02-05 ENCOUNTER — Other Ambulatory Visit: Payer: Self-pay

## 2024-02-05 ENCOUNTER — Telehealth: Payer: Self-pay | Admitting: Endocrinology

## 2024-02-05 DIAGNOSIS — E1165 Type 2 diabetes mellitus with hyperglycemia: Secondary | ICD-10-CM

## 2024-02-05 DIAGNOSIS — E039 Hypothyroidism, unspecified: Secondary | ICD-10-CM

## 2024-02-05 MED ORDER — LEVOTHYROXINE SODIUM 112 MCG PO TABS: 112.0000 ug | ORAL_TABLET | Freq: Every day | ORAL | 3 refills | Status: DC

## 2024-02-05 MED ORDER — FREESTYLE LIBRE 3 SENSOR MISC: 2 refills | Status: DC

## 2024-02-05 NOTE — Telephone Encounter (Signed)
 MEDICATION:  1)   FreeStyle Libre 3 Sensor Continuous Glucose Sensor (FREESTYLE LIBRE 3 SENSOR) MISC  2)   levothyroxine  levothyroxine  (SYNTHROID ) 112 MCG tablet  PHARMACY:    [id number] [place], [place] - [address] AT [address]OSS FROM [address] (Ph: [phone])    HAS THE PATIENT CONTACTED THEIR PHARMACY?  Yes  IS THIS A 90 DAY SUPPLY : Yes  IS PATIENT OUT OF MEDICATION: Yes  IF NOT; HOW MUCH IS LEFT:   LAST APPOINTMENT DATE: @ 10/14/2023  NEXT APPOINTMENT DATE:@8 /03/2024  DO WE HAVE YOUR PERMISSION TO LEAVE A DETAILED MESSAGE?: Yes  OTHER COMMENTS:    **Let patient know to contact pharmacy at the end of the day to make sure medication is ready. **  ** Please notify patient to allow 48-72 hours to process**  **Encourage patient to contact the pharmacy for refills or they can request refills through [facility]**

## 2024-02-18 ENCOUNTER — Other Ambulatory Visit: Payer: Self-pay

## 2024-02-21 ENCOUNTER — Other Ambulatory Visit: Payer: Self-pay

## 2024-02-21 DIAGNOSIS — E1165 Type 2 diabetes mellitus with hyperglycemia: Secondary | ICD-10-CM

## 2024-02-21 MED ORDER — FREESTYLE LIBRE 3 PLUS SENSOR MISC: 1.0000 | 3 refills | Status: DC

## 2024-02-27 ENCOUNTER — Telehealth: Payer: Self-pay

## 2024-02-27 NOTE — Telephone Encounter (Signed)
 Sent to mD

## 2024-02-27 NOTE — Telephone Encounter (Addendum)
 Called patient and talked he has concern about whenever he calls to our office especially most recently for the freestyle libre 3+, took long time to process including prior authorization and [facility].  He has no specific requests to me however he wanted to tell me that he did not have good experience and tired of calling our office.  Robert Melia Hopes, MD [facility] [address] [place], [address] [place], [place] [address] Phone # [phone]

## 2024-02-27 NOTE — Telephone Encounter (Signed)
 Patient called requesting a callback from MD to further discuss his prescriptions and insurance. Per patient they discussed it during visit and needs to further discuss.

## 2024-02-28 ENCOUNTER — Other Ambulatory Visit: Payer: Self-pay | Admitting: Cardiovascular Disease

## 2024-02-28 DIAGNOSIS — I4891 Unspecified atrial fibrillation: Secondary | ICD-10-CM

## 2024-02-28 NOTE — Telephone Encounter (Signed)
 Eliquis  5mg  refill request received. Patient is 63 years old, weight-133.8kg, Crea-2.43 on 01/08/24, Diagnosis-Afib, and last seen by Kathyrn Lawrence on 07/04/23. Dose is appropriate based on dosing criteria. Will send in refill to requested pharmacy.

## 2024-02-28 NOTE — Telephone Encounter (Signed)
 After reviewing chart patient sent message on 4/30 about freestyle libre request refill sent in 6 minutes later. Spoke with MD per MD patient is upset at the length it takes to get the PA done. Will send to administrator as directed by MD.

## 2024-03-05 ENCOUNTER — Other Ambulatory Visit (HOSPITAL_COMMUNITY): Payer: Self-pay

## 2024-03-05 ENCOUNTER — Telehealth: Payer: Self-pay | Admitting: Pharmacy Technician

## 2024-03-05 NOTE — Telephone Encounter (Signed)
 Pharmacy Patient Advocate Encounter   Received notification from Physician's Office that prior authorization for [facility] 3 Sensors is required/requested.   Insurance verification completed.   The patient is insured through [facility] .   Per test claim: PA required; PA submitted to above mentioned insurance via CoverMyMeds Key/confirmation #/EOC (Key: [id number]) Status is pending   Submitted with documentation of patient's trial of Dexcom, and discontinuation due to cost.

## 2024-03-06 ENCOUNTER — Other Ambulatory Visit: Payer: Self-pay

## 2024-03-06 DIAGNOSIS — E1165 Type 2 diabetes mellitus with hyperglycemia: Secondary | ICD-10-CM

## 2024-03-06 MED ORDER — DEXCOM G7 SENSOR MISC: 3 refills | Status: DC

## 2024-03-06 NOTE — Telephone Encounter (Signed)
 Please check with the patient if he is willing to take Dexcom G7 CGM, we can send the prescription.  His medical plan does not cover freestyle libre 3+.

## 2024-03-06 NOTE — Telephone Encounter (Signed)
 Pharmacy Patient Advocate Encounter  Received notification from [facility] that Prior Authorization for [facility] 3 has been DENIED.  Your plan only covers this product when your doctor gives us  a medical reason you cannot use Dexcom Continuous Glucose Monitor.   PA #/Case ID/Reference #: (KeyJeppie Moles) PA Case ID #: [id number]   Per chart, patient switched from Dexcom due to cost. Plan requires medical rationale to not use the preferred CGM. Please advise.

## 2024-03-09 ENCOUNTER — Other Ambulatory Visit (HOSPITAL_COMMUNITY): Payer: Self-pay

## 2024-03-09 ENCOUNTER — Telehealth: Payer: Self-pay

## 2024-03-09 NOTE — Telephone Encounter (Signed)
 Pharmacy Patient Advocate Encounter   Received notification from Pt Calls Messages that prior authorization for Dexcom G7 sensor is required/requested.   Insurance verification completed.   The patient is insured through [facility] .   Per test claim: PA required; PA submitted to above mentioned insurance via CoverMyMeds Key/confirmation #/EOC [id number] Status is pending

## 2024-03-10 NOTE — Telephone Encounter (Signed)
 Your PA request has been approved. Additional information will be provided in the approval communication. (Message 1145) Effective Date: 03/09/2024 Authorization Expiration Date: 03/09/2025

## 2024-03-11 ENCOUNTER — Other Ambulatory Visit: Payer: Self-pay | Admitting: Cardiovascular Disease

## 2024-03-13 ENCOUNTER — Other Ambulatory Visit: Payer: Self-pay | Admitting: Cardiovascular Disease

## 2024-03-24 ENCOUNTER — Telehealth: Payer: Self-pay | Admitting: Cardiovascular Disease

## 2024-03-24 ENCOUNTER — Other Ambulatory Visit: Payer: Self-pay

## 2024-03-24 NOTE — Telephone Encounter (Signed)
 Patient identification verified by 2 forms. Robert Duck, RN     Called and spoke to patient  Patient states:  - SOB has been present for a while now but worsened over the last 1-2 weeks.  - Also experiencing dizziness and no stamina  - He was supposed to establish future care with Dr. Alvis Ba following Dr. Loetta Ringer leaving but Dr. Alvis Ba does not have a soon enough appointment  -  He has a [place] mobile which has been saying he is in Afib. He checked rhythm and rate during call. HR 82 at time of call. Not able to provide blood pressure during call.   Patient denies:    - Other abnormal symptoms other than SOB and fatigue   Interventions/Plan: - Patient scheduled with PEA team before call was transferred with Dr. Renna Cary for tomorrow morning.   Reviewed ED warning signs/precautions  Patient agrees with plan, no questions at this time

## 2024-03-24 NOTE — Telephone Encounter (Signed)
 Pt c/o Shortness Of Breath: STAT if SOB developed within the last 24 hours or pt is noticeably SOB on the phone  1. Are you currently SOB (can you hear that pt is SOB on the phone)? yes  2. How long have you been experiencing SOB? 2 weeks   3. Are you SOB when sitting or when up moving around? both  4. Are you currently experiencing any other symptoms? fatigue

## 2024-03-25 ENCOUNTER — Ambulatory Visit: Attending: Cardiology | Admitting: Cardiology

## 2024-03-25 ENCOUNTER — Encounter: Payer: Self-pay | Admitting: Cardiology

## 2024-03-25 ENCOUNTER — Other Ambulatory Visit: Payer: Self-pay

## 2024-03-25 ENCOUNTER — Telehealth: Payer: Self-pay

## 2024-03-25 ENCOUNTER — Other Ambulatory Visit: Payer: Self-pay | Admitting: *Deleted

## 2024-03-25 VITALS — BP 92/42 | HR 60 | Ht 71.0 in | Wt 311.0 lb

## 2024-03-25 DIAGNOSIS — Z951 Presence of aortocoronary bypass graft: Secondary | ICD-10-CM

## 2024-03-25 DIAGNOSIS — R0602 Shortness of breath: Secondary | ICD-10-CM | POA: Diagnosis not present

## 2024-03-25 DIAGNOSIS — I5033 Acute on chronic diastolic (congestive) heart failure: Secondary | ICD-10-CM

## 2024-03-25 DIAGNOSIS — I10 Essential (primary) hypertension: Secondary | ICD-10-CM

## 2024-03-25 DIAGNOSIS — Z952 Presence of prosthetic heart valve: Secondary | ICD-10-CM

## 2024-03-25 DIAGNOSIS — Z79899 Other long term (current) drug therapy: Secondary | ICD-10-CM

## 2024-03-25 DIAGNOSIS — I4819 Other persistent atrial fibrillation: Secondary | ICD-10-CM

## 2024-03-25 NOTE — Telephone Encounter (Signed)
 Spoke to patient yesterday who stated his pharmacy would not give him the Dexcom G7 sensors. RN called and was told that they currently have them on back order. Message sent to patient to offer sending to another pharmacy and offered to leave a sample at front desk for patient. Waiting to hear back from patient on how to proceed

## 2024-03-25 NOTE — Progress Notes (Signed)
 Cardiology Office Note:  .   Date:  03/25/2024  ID:  Robert [name], DOB [birth year], MRN [id number] PCP: Suzzanne Estrin, MD  [facility] Providers Cardiologist:  Dorothye Gathers, MD     History of Present Illness: .   Robert [name] is a 63 y.o. male Discussed the use of AI scribe software for clinical note transcription with the patient, who gave verbal consent to proceed.  History of Present Illness Robert [name] is a 63 year old male with atrial fibrillation and chronic kidney disease who presents with dyspnea on exertion and hypotension. He was referred by Dr. Loetta Ringer for follow-up regarding his cardiovascular condition.  He experiences significant dyspnea on exertion and hypotension, noting that his blood pressure has never been this low except during a previous hospitalization. He describes persistent shortness of breath that prevents rest and causes fatigue. He uses a BiPAP machine but often abandons it due to discomfort, opting instead for a recliner. Albuterol , which is nearly depleted, is used to aid his breathing.  He has a history of atrial fibrillation and is currently on Eliquis  5 mg twice a day. His recent heart rate was 82 bpm, yet he continues to experience atrial fibrillation without conversion.  He has chronic kidney disease stage 4 with a history of elevated creatinine levels, most recently at 2.4. As a kidney transplant recipient, he is on immunosuppressants including Mycophenolate  and Nulojix . He reports a urinary infection with Klebsiella pneumoniae that persists despite treatment.   His medication regimen includes amlodipine  7.5 mg daily, carvedilol  25 mg twice a day, hydralazine  100 mg every 8 hours, spironolactone 25 mg daily, torsemide 40 mg daily, and rosuvastatin  20 mg daily. He has not taken torsemide or metolazone for the past three days. He reports a prior LDL of 55 and a hemoglobin A1c of 8.8, indicating challenging diabetes control. He also has a  history of hypothyroidism, managed with Synthroid  112 mcg daily.  He mentions significant fluid retention, with a current weight of 311 pounds and a target weight of 285 pounds. He experiences swelling, particularly on the side of his kidney transplant, and notes difficulty laying flat at night due to orthopnea.  No fevers or chills, but he reports coughing, which he attributes to his anti-rejection medications. He notes shivering when the temperature is low, preferring a warmer environment.      Studies Reviewed: Aaron Aas   EKG Interpretation Date/Time:  Wednesday March 25 2024 09:29:34 EDT Ventricular Rate:  60 PR Interval:    QRS Duration:  98 QT Interval:  474 QTC Calculation: 474 R Axis:   25  Text Interpretation: Atrial fibrillation When compared with ECG of 27-Aug-2023 08:08, Vent. rate has decreased BY  42 BPM T wave inversion no longer evident in Lateral leads Confirmed by Dorothye Gathers ([id number]) on 03/25/2024 9:36:22 AM    Results LABS LDL: 52 (07/2023) HbA1c: 8.8 (07/2023) Hb: 12.7 (07/2023) Cr: 2.4 (07/2023) TSH: 2.7 (07/2023) Hb: 13.4 (10/2023) Cr: 2.1  DIAGNOSTIC Echocardiogram: Ejection fraction 65-70%, severe left ventricular hypertrophy, mild mitral regurgitation, bioprosthetic 27mm Edwards Resilia prosthetic valve in aortic position with mean gradient 11 mmHg, Vmax 2.2 m/s (05/2023) Risk Assessment/Calculations:            Physical Exam:   VS:  BP (!) 92/42   Pulse 60   Ht 5' 11 (1.803 m)   Wt (!) 311 lb (141.1 kg)   SpO2 96%   BMI 43.38 kg/m    Wt Readings from Last  3 Encounters:  03/25/24 (!) 311 lb (141.1 kg)  01/14/24 295 lb (133.8 kg)  12/16/23 295 lb (133.8 kg)    GEN: Well nourished, well developed in no acute distress NECK: No JVD; No carotid bruits CARDIAC: RRR, no murmurs, no rubs, no gallops RESPIRATORY:  Clear to auscultation without rales, wheezing or rhonchi  ABDOMEN: Soft, non-tender, non-distended EXTREMITIES:  No edema; No deformity    ASSESSMENT AND PLAN: .    Assessment and Plan Assessment & Plan Acute on chronic diastolic heart failure Acute on chronic diastolic heart failure with significant fluid overload, orthopnea, and dyspnea on exertion. Hypotension and significant peripheral edema are present. Non-compliance with torsemide and metolazone has contributed to fluid retention. Management of fluid overload will be conducted at home per his preference, with adjustments to diuretics and discontinuation of certain antihypertensives to manage blood pressure and fluid status. - Discontinue amlodipine  and hydralazine  to address hypotension. - Administer torsemide 40 mg twice daily for four days, then reduce to 40 mg daily. - Administer metolazone 5 mg on Mondays and Thursdays for three weeks. This mimics instructions from [facility].  - Advise on low-sodium diet. - Monitor for worsening symptoms and advise hospitalization if condition deteriorates.  Paroxysmal atrial fibrillation Paroxysmal atrial fibrillation with good rate control. Heart rate is 82 bpm. He remains in atrial fibrillation, which is a chronic condition for him. No conversion attempts discussed.  CAD post CABG AVR -no changes.   Chronic kidney disease stage 4, renal tx Chronic kidney disease stage 4 with a creatinine level of 2.1, potentially falsely low due to fluid overload. The goal is to manage fluid overload to improve renal function. Monitoring of renal function is essential to assess the impact of diuretic therapy. - Monitor renal function with blood work next week.  Diabetes mellitus, uncontrolled Uncontrolled diabetes mellitus with a hemoglobin A1c of 8.8. He is not currently on GLP-1 receptor agonists, which may be considered in the future for weight management and glycemic control.  Morbid obesity Morbid obesity. He is advised to reduce weight to 285 lbs before starting Mounjaro  for weight management.  Hypothyroidism Hypothyroidism managed with  Synthroid  112 mcg daily.  Urinary tract infection due to Klebsiella pneumoniae Chronic urinary tract infection due to Klebsiella pneumoniae, persistent despite treatment. He is on immunosuppressants post-renal transplant, complicating infection management.  Follow-up Plans to monitor response to adjusted diuretic regimen and blood pressure management. Emphasis on monitoring for any signs of worsening condition due to his complex medical history. - Schedule follow-up blood work next week to assess renal function and electrolytes. - Arrange follow-up appointment in 1-2 weeks, with flexibility to accommodate his schedule.          Signed, Dorothye Gathers, MD

## 2024-03-25 NOTE — Patient Instructions (Signed)
 Medication Instructions:  Please discontinue your Amlodipine  and Hydralazine . Take Torsemide 40 mg twice a day for 4 days then return to regular dose. Take Metolazone on Monday and Thursday for the next 3 weeks. Continue all other medications as listed.  *If you need a refill on your cardiac medications before your next appointment, please call your pharmacy*  Lab Work: Please have blood work in 1 week (BMP)  If you have labs (blood work) drawn today and your tests are completely normal, you will receive your results only by: MyChart Message (if you have MyChart) OR A paper copy in the mail If you have any lab test that is abnormal or we need to change your treatment, we will call you to review the results.  Follow-Up: At [facility], you and your health needs are our priority.  As part of our continuing mission to provide you with exceptional heart care, our providers are all part of one team.  This team includes your primary Cardiologist (physician) and Advanced Practice Providers or APPs (Physician Assistants and Nurse Practitioners) who all work together to provide you with the care you need, when you need it.  Your next appointment:   1 week(s)  Provider:   Dorothye Gathers, MD or any APP.     We recommend signing up for the patient portal called MyChart.  Sign up information is provided on this After Visit Summary.  MyChart is used to connect with patients for Virtual Visits (Telemedicine).  Patients are able to view lab/test results, encounter notes, upcoming appointments, etc.  Non-urgent messages can be sent to your provider as well.   To learn more about what you can do with MyChart, go to [email or web address].

## 2024-04-01 ENCOUNTER — Ambulatory Visit: Attending: Physician Assistant | Admitting: Cardiology

## 2024-04-01 ENCOUNTER — Encounter: Payer: Self-pay | Admitting: Physician Assistant

## 2024-04-01 ENCOUNTER — Telehealth: Payer: Self-pay | Admitting: Cardiology

## 2024-04-01 VITALS — BP 96/69 | HR 90 | Ht 71.0 in | Wt 291.0 lb

## 2024-04-01 DIAGNOSIS — I4819 Other persistent atrial fibrillation: Secondary | ICD-10-CM | POA: Diagnosis not present

## 2024-04-01 DIAGNOSIS — G4733 Obstructive sleep apnea (adult) (pediatric): Secondary | ICD-10-CM

## 2024-04-01 DIAGNOSIS — Z951 Presence of aortocoronary bypass graft: Secondary | ICD-10-CM | POA: Diagnosis not present

## 2024-04-01 DIAGNOSIS — Z952 Presence of prosthetic heart valve: Secondary | ICD-10-CM

## 2024-04-01 DIAGNOSIS — I5033 Acute on chronic diastolic (congestive) heart failure: Secondary | ICD-10-CM | POA: Diagnosis not present

## 2024-04-01 MED ORDER — SPIRONOLACTONE 25 MG PO TABS: 12.5000 mg | ORAL_TABLET | Freq: Every day | ORAL | 3 refills | Status: AC

## 2024-04-01 NOTE — Telephone Encounter (Signed)
 Spoke with patient and he stated he did not get BMET that was ordered last week. He would like to know if he can get it done at today's visit.   Informed patient that he can get labs done after his visit

## 2024-04-01 NOTE — Patient Instructions (Addendum)
 Medication Instructions:    START TAKING :  SPRINOLACTONE 12.5 MG ONCE A DAY   FOR 5 DAYS TAKE TORSEMIDE 40 MG TWICE A DAY   THEN  BASED UPON YOUR SYMPTOMS YOU MAY GO BACK TO : TORSEMIDE 20 MG TWICE A DAY.  *If you need a refill on your cardiac medications before your next appointment, please call your pharmacy*   Lab Work:   PLEASE GO DOWN STAIRS  [facility]  FIRST FLOOR   ( GET OFF ELEVATORS WALK TOWARDS WAITING AREA LAB LOCATED BY PHARMACY): CMET CBC TSH TODAY    If you have labs (blood work) drawn today and your tests are completely normal, you will receive your results only by: MyChart Message (if you have MyChart) OR A paper copy in the mail If you have any lab test that is abnormal or we need to change your treatment, we will call you to review the results.    Testing/Procedures: Your physician has requested that you have an echocardiogram. Echocardiography is a painless test that uses sound waves to create images of your heart. It provides your doctor with information about the size and shape of your heart and how well your heart's chambers and valves are working. This procedure takes approximately one hour. There are no restrictions for this procedure. Please do NOT wear cologne, perfume, aftershave, or lotions (deodorant is allowed). Please arrive 15 minutes prior to your appointment time.  Please note: We ask at that you not bring children with you during ultrasound (echo/ vascular) testing. Due to room size and safety concerns, children are not allowed in the ultrasound rooms during exams. Our front office staff cannot provide observation of children in our lobby area while testing is being conducted. An adult accompanying a patient to their appointment will only be allowed in the ultrasound room at the discretion of the ultrasound technician under special circumstances. We apologize for any inconvenience.     Follow-Up:  At [facility], you and your health needs  are our priority.  As part of our continuing mission to provide you with exceptional heart care, our providers are all part of one team.  This team includes your primary Cardiologist (physician) and Advanced Practice Providers or APPs (Physician Assistants and Nurse Practitioners) who all work together to provide you with the care you need, when you need it.    Your next appointment:   AS SCHEDULED     We recommend signing up for the patient portal called MyChart.  Sign up information is provided on this After Visit Summary.  MyChart is used to connect with patients for Virtual Visits (Telemedicine).  Patients are able to view lab/test results, encounter notes, upcoming appointments, etc.  Non-urgent messages can be sent to your provider as well.   To learn more about what you can do with MyChart, go to [email or web address].    Other Instructions:  BRING TO NEXT APPOINTMENT:  PLEASE KEEP A LOG OF DAILY WEIGHTS  KEEP A LOG OF BLOOD PRESSURE READINGS  KEEP A LOG OF URINE OUT PUT

## 2024-04-01 NOTE — Progress Notes (Signed)
 Cardiology Office Note:  .   Date:  04/01/2024  ID:  Robert [name], DOB 1960/12/28, MRN [id number] PCP: Robert [name] ORN, MD  [facility] Providers Cardiologist:  Robert Parchment, MD {  History of Present Illness: .   Robert [name] is a 63 y.o. male with history of CAD status post CABG x1 (LIMA to LAD) and pericardial AVR 09/2021, chronic HFpEF, persistent atrial fibrillation, nephrectomy age 4 with subsequent ESRD post renal transplant 2018 with subsequent CKD 4, chronic UTI, uncontrolled diabetes, morbid obesity, hypothyroidism, OSA on BiPAP, CVA.     AVR and CABG x 1 Preprocedure right left heart catheterization showing multivessel disease, status post CABG x 1 Postoperative had atrial fibrillation and perioperative CVA with multiple embolic lesions noted on MRI.  He converted after getting amiodarone .   Most recent echocardiogram May 2024 with EF 65 to 70%.  Normal RV.  Stable AV function.  SVT Noted December 2017, aborted with vagal maneuvers Reportedly had EP study and attempted ablation but not able to be induced so no ablation performed. Although on his chart in 2018 shows ablation?  Chronic HFpEF Most recent echocardiogram May 2024, EF 65 to 70% with severe LVH.  Normal RV.  Mild MR.  .  LA mildly dilated.   Persistent atrial fibrillation Postoperative atrial fibrillation after AVR.   Had been maintaining sinus rhythm however, has been in A-fib since September 2024.       Patient with history of AVR in CABG x 1. Patient was seen recently June 18 reporting DOE and hypotension.  Reportedly had increased weight gain.  Normal weight reportedly 285 but was 311.  Amlodipine  and his hydralazine  were discontinued due to hypotension.  He was recommended to take torsemide 40 mg twice daily x 4 days then reduce back to 40 mg daily.  Started on metolazone 5 mg Monday and Thursday for 3 weeks.  Today patient presents for follow-up to assess volume status/diuretic regiment.   He does report very brisk diuresis.  Weight today is 291 pounds down from 311 at his previous office visit.  He thinks his baseline weight is around 285 to 290 pounds. Since he does self-catheterization he estimates that he has had around 3 to 4 L output.  He reports significant improvement in volume status.  He feels like his entire physical condition has declined over the year so unclear exactly what his shortness of breath is but does not note any significant symptoms although visually seems to be slightly short of breath.  He has been compliant with the regimen but had to extend the torsemide 40 mg twice daily to every day rather than stopping at just 4 days.  Reports still responding with diuretics well but urinary output has been slowing down a little bit.   Otherwise he is very diligent about taking all of his medications and compliant and active in his management of his health  ROS: Denies: Chest pain, shortness of breath, orthopnea, peripheral edema, palpitations, decreased exercise intolerance, fatigue, lightheadedness.   Studies Reviewed: .         Risk Assessment/Calculations:             Physical Exam:   VS:  BP 96/69   Pulse 90   Ht 5' 11 (1.803 m)   Wt 291 lb (132 kg)   SpO2 94%   BMI 40.59 kg/m    Wt Readings from Last 3 Encounters:  04/01/24 291 lb (132 kg)  03/25/24 (!) 311 lb (  141.1 kg)  01/14/24 295 lb (133.8 kg)    GEN: Well nourished, well developed in no acute distress NECK: + JVD; No carotid bruits CARDIAC: RRR, 2/6 murmur RESPIRATORY:  Clear to auscultation without rales, wheezing or rhonchi  ABDOMEN: Soft, non-tender, non-distended EXTREMITIES:  1-2+ edema R>L  ASSESSMENT AND PLAN: .    Acute on chronic HFpEF He had brisk diuresis down from 311 pounds to 291 pounds today.  Baseline weight estimated to be around 285 to 290 pounds however I think he still clearly demonstrates signs of congestion with significant JVD, pitting edema and large, distended,  firm abdomen. Continue with torsemide 40 mg twice daily for 5 more days.  He will keep blood pressure log, daily weights, urinary output and bring to upcoming appointment.  He has instructions he can adjust depending duration depending on his signs and symptoms.  Otherwise resume back to torsemide 20 mg twice daily after reaching euvolemia. Continue metolazone 5 mg twice a week (Monday and Thursday).  He has been on spironolactone 12.5 mg, in the chart was on 25.  This has been corrected. Check CMP, CBC today.  Of note he has not been taking potassium.  Will get labs today and then adjust potassium dose as needed. He has been given ER precautions if diuresis slows and/or has worsening symptoms.  Will try to manage outpatient for the time being. Repeating echocardiogram.  Of note he does have history of severe LVH, atrial fibrillation on his prior echocardiogram, no documentation of infiltrative disease however need to consider.  Will review films with MD.  Hypotension Amlodipine  and hydralazine  were stopped last visit but still hypotensive although home readings do vary and are a bit improved. He has had some issues with hypotension although reports he is asymptomatic from this.  Feels warm.  At home BP measurements are in the 130s.  I think it is okay to continue his carvedilol  25 mg twice daily for now.  Reevaluate at follow-up and consider switching or reducing dose if needed.  Persistent atrial fibrillation Initially noted to be postoperative at the time of his AVR however he has been in atrial fibrillation since at least September 2024.  Fortunately he is rate controlled with heart rates generally in the 80s to 90s.  This is also likely contributing to his HFpEF exacerbation. He has not had DCCV, I think he would benefit from restoring sinus rhythm.  Will discuss plan with MD but my preference would be to diurese first and to plan for DCCV once euvolemic. Continue with Eliquis  5 mg twice daily,  carvedilol  25 mg twice daily.  Very diligent about taking his DOAC and never misses.  CAD status post CABG x 1 Hyperlipidemia Does not demonstrate any anginal symptoms.  Seems to be stable.  LDL well-controlled.  Goal less than 55.  Currently 52, 2 months ago. No aspirin  with Eliquis .  He can continue rosuvastatin  20 mg (CrCl>2ml/min), if we need more LDL controlled and would likely need to switch to high atorvastatin  80 mg.  AVR Stable aortic valve function on echocardiogram May 2024.  Mild aortic valve stenosis noted.  Mean gradient 11. Reassess with echo. Post visit, reached out to pharmD regarding SBE ppx in setting of his renal insufficiency. They have recommended doxycycline 100mg  30-60 minutes before dental work. Will relay in result note when labs come back.  Diabetes BMI 40 Followed by endocrinology, A1c uncontrolled 8.8%. Holding off on Mounjaro  until acute issues resolved.  CKD Status post renal transplant  Followed by nephrology at [facility], if we run into renal issues and AKI, likely needs a follow-up with [facility] sooner than later. On Cellcept , Belatacept , and Prednisone . CMP and CBC today.  Chronic UTI Persistent issue due to Klebsiella pneumoniae.  Would not be candidate for SGLT2 inhibitor.  OSA on BiPAP Compliant.    Dispo: 2 weeks, with myself (in office with Callie that day).  Follow-up already scheduled. He will be out of town for 1 week.  Follow-up on volume status and adjust diuretic regimen if needed.  Signed, Thom LITTIE Sluder, PA-C

## 2024-04-01 NOTE — Telephone Encounter (Signed)
 Pt called in asking to speak with nurse Holley, RN. He states he does not want to share what is going on until he gets on the phone with her.

## 2024-04-02 LAB — CBC
Hematocrit: 40.3 % (ref 37.5–51.0)
Hemoglobin: 12.6 g/dL — ABNORMAL LOW (ref 13.0–17.7)
MCH: 27.9 pg (ref 26.6–33.0)
MCHC: 31.3 g/dL — ABNORMAL LOW (ref 31.5–35.7)
MCV: 89 fL (ref 79–97)
Platelets: 249 10*3/uL (ref 150–450)
RBC: 4.52 x10E6/uL (ref 4.14–5.80)
RDW: 14.5 % (ref 11.6–15.4)
WBC: 8.7 10*3/uL (ref 3.4–10.8)

## 2024-04-02 LAB — COMPREHENSIVE METABOLIC PANEL WITH GFR
ALT: 13 IU/L (ref 0–44)
AST: 17 IU/L (ref 0–40)
Albumin: 4.6 g/dL (ref 3.9–4.9)
Alkaline Phosphatase: 107 IU/L (ref 44–121)
BUN/Creatinine Ratio: 14 (ref 10–24)
BUN: 43 mg/dL — ABNORMAL HIGH (ref 8–27)
Bilirubin Total: 1.1 mg/dL (ref 0.0–1.2)
CO2: 22 mmol/L (ref 20–29)
Calcium: 10.2 mg/dL (ref 8.6–10.2)
Chloride: 95 mmol/L — ABNORMAL LOW (ref 96–106)
Creatinine, Ser: 3.13 mg/dL — ABNORMAL HIGH (ref 0.76–1.27)
Globulin, Total: 2.3 g/dL (ref 1.5–4.5)
Glucose: 174 mg/dL — ABNORMAL HIGH (ref 70–99)
Potassium: 3.9 mmol/L (ref 3.5–5.2)
Sodium: 138 mmol/L (ref 134–144)
Total Protein: 6.9 g/dL (ref 6.0–8.5)
eGFR: 21 mL/min/{1.73_m2} — ABNORMAL LOW (ref >59)

## 2024-04-02 LAB — TSH: TSH: 2.36 u[IU]/mL (ref 0.450–4.500)

## 2024-04-03 ENCOUNTER — Ambulatory Visit: Payer: Self-pay | Admitting: Cardiology

## 2024-04-03 MED ORDER — DOXYCYCLINE HYCLATE 100 MG PO CAPS: 100.0000 mg | ORAL_CAPSULE | Freq: Once | ORAL | 0 refills | Status: AC

## 2024-04-05 NOTE — Progress Notes (Deleted)
 Cardiology Office Note:    Date:  04/05/2024   ID:  Robert [name], DOB September 02, 1961, MRN [id number]  PCP:  Vernadine Charlie ORN, MD  Cardiologist:  Oneil Parchment, MD { Click to update primary MD,subspecialty MD or APP then REFRESH:1}    Referring MD: [name], Charlie ORN, MD   Chief Complaint: follow-up of CHF  History of Present Illness:    Robert [name] is a 63 y.o. obese male with a history of CAD s/p CABG x1 (LIMA-LAD) and pericardial AVR for aortic stenosis in 09/2021, chronic HFpEF, persistent atrial fibrillation on Eliquis , SVT with negative EP study in 11/2016 (unable to induce arrhythmias at that time), CVA, hypertension, hyperlipidemia, type 2 diabetes mellitus, hypothyroidism, congenital non-functioning left kidney s/p nephrectomy in 2014 with subsequent ESRD s/p renal transplant in 2018 now with CKD stage IV, anemia of chronic disease, obstructive sleep apnea on BiPAP, and chronic UTI who is followed by Dr. Parchment and presents today for follow-up of CHF.  Patient underwent R/ LHC in 08/2021 after routine Echo in 04/2021 for aortic valve disease showed moderate to severe aortic stenosis. Cath showed 80% stenosis of distal left main to proximal LAD followed by 90% stenosis of proximal to mid LAD as well as 50% stenosis of OM2, 20% stenosis of lateral OM2, and 80% stenosis of RPAV. He was referred to CT surgery and underwent a single vessel CABG with LIMA to LAD as well as pericardial AVR in 09/2021 with Dr. Lucas. Post-op course was complicated by atrial fibrillation and embolic CVA.and he was started on anticoagulation with Coumadin . He was later switched to Eliquis . Last Echo in 05/2023 showed LVEF of 65-70% with severe LVH, mildly enlarged RV with normal RV function, severe MAC with mild MR, and normal structure and function of the aortic valve prosthesis (mean gradient 11.0 mmHg).  Patient was seen by Dr. Parchment on 03/25/2024 at which time he reported significant dyspnea on exertion and  significant fluid retention and weight gain. He had been non-compliant with his Torsemide and Metolazone.He also reported issues with hypotension. He was noted to be in rate controlled atrial fibrillation at that time. Amlodipine  and Hydralazine  were stopped and his Torsemide was increased to 40mg  twice daily for 4 days with plans to then decreased back to once daily. He was also instructed to take Metolazone twice a week (Mondays and Thursdays) for 3 weeks. He was seen back in the office by Thom Sluder, PA-C, on 04/01/2024 at which time he reported very brisk diuresis and improvement in his volume status. His weight was down 20 lbs. Repeat Echo was ordered and is scheduled for 05/2024.  Patient presents today for follow-up. ***  Acute on Chronic Diastolic CHF Last Echo in 05/2023 showed  LVEF of 65-70% with severe LVH as well as mildly enlarged RV with normal RV function. He has been seen multiple times in our office recently for acute diastolic CHF in setting of non-compliance with oral diuretics. He responded well with resumptions of these.  *** - Continue Torsemide 40mg  twice daily. Metolazone *** - Continue Spironolactone  12.5mg  daily.  - No SGLT2 inhibitor given chronic UTI.  - Continue daily weights and sodium/ fluid restrictions.  - Repeat Echo was ordered at last visit and is scheduled for next month.   CAD s/p CABG S/p single vessel CAD with LIMA to LAD in 09/2021.  - No chest pain.  - No aspirin  given need for Eliquis .  - Continue statin.   Persistent Atrial Fibrillation Rate  controlled.  - Continue Coreg  25mg  twice daily.  - Continue Eliquis  5mg  twice daily.  Aortic Stenosis s/p AVR S/p pericardial AVR in 09/2021 at time of CABG. Last Echo in 05/2023 showed normal structure and function of the aortic valve prosthesis (mean gradient 11.0 mmHg). - Continue SBE prophylaxis with Doxycyline prior to dental procedure.  - Can reassess on repeat Echo which is scheduled for next month.    Mild Mitral Regurgitation Last Echo in 05/2023 showed severe PAC with mild MR.  - Can reassess on repeat Echo which is scheduled for next month.  Hypertension History of hypertension but he has had more issues with hypotension lately. - Continue Coreg  and Spironolactone  as above.   Hyperlipidemia Lipid panel in 01/2024: Total Cholesterol 114, Triglycerides 85, HDL 45, LDL 52. LDL goal <55.  - Continue Crestor  20mg  daily. CrCl > 45 so okay to continue current dose.   Type 2 Diabetes Mellitus Hemoglobin A1c 8.8 in 01/2024.  - On Insulin .  - Management per PCP.  Congenital Non-Functioning Left Kidney S/p Nephrectomy in 2014 ESRD s/p Renal Transplant in 2018 with Subsequent CKD Stage IV Baseline creatinine around 2.5 to 3.0. Last creatinine 3.13 on 04/01/2024.  - Will repeat BMET today. ***  Obstructive Sleep Apnea - Continue BiPAP.  EKGs/Labs/Other Studies Reviewed:    The following studies were reviewed:  Right/ Left Cardiac Catheterization 08/22/2021:   2nd Mrg lesion is 50% stenosed.   Lat 2nd Mrg lesion is 20% stenosed.   RPAV lesion is 80% stenosed.   Dist LM to Prox LAD lesion is 80% stenosed.   Prox LAD to Mid LAD lesion is 90% stenosed.   LV end diastolic pressure is mildly elevated.  Diagnostic Dominance: Right  _______________  Echocardiogram 05/21/2023: Impressions: 1. Left ventricular ejection fraction, by estimation, is 65 to 70%. The  left ventricle has normal function. The left ventricle has no regional  wall motion abnormalities. There is severe left ventricular hypertrophy.  Left ventricular diastolic parameters   are indeterminate.   2. Right ventricular systolic function is normal. The right ventricular  size is mildly enlarged.   3. Left atrial size was mildly dilated.   4. The mitral valve is normal in structure. Mild mitral valve  regurgitation. No evidence of mitral stenosis. Severe mitral annular  calcification.   5. The aortic valve has been  repaired/replaced. Aortic valve  regurgitation is not visualized. Mild aortic valve stenosis. There is a 27  mm Edwards Inspiris Resilia Prosthesis valve present in the aortic  position. Echo findings are consistent with normal   structure and function of the aortic valve prosthesis. Aortic valve mean  gradient measures 11.0 mmHg. Aortic valve Vmax measures 2.23 m/s.   6. The inferior vena cava is dilated in size with >50% respiratory  variability, suggesting right atrial pressure of 8 mmHg.   Comparison(s): No significant change from prior study. Prior images  reviewed side by side.     EKG:  EKG *** ordered today. EKG personally reviewed and demonstrates ***.  Recent Labs: 06/24/2023: BNP 207.7 07/04/2023: NT-Pro BNP 2,326 04/01/2024: ALT 13; BUN 43; Creatinine, Ser 3.13; Hemoglobin 12.6; Platelets 249; Potassium 3.9; Sodium 138; TSH 2.360  Recent Lipid Panel    Component Value Date/Time   CHOL 114 01/08/2024 1420   TRIG 85 01/08/2024 1420   HDL 45 01/08/2024 1420   CHOLHDL 2.5 01/08/2024 1420   VLDL 9 05/02/2022 0334   LDLCALC 52 01/08/2024 1420    Physical Exam:  Vital Signs: There were no vitals taken for this visit.    Wt Readings from Last 3 Encounters:  04/01/24 291 lb (132 kg)  03/25/24 (!) 311 lb (141.1 kg)  01/14/24 295 lb (133.8 kg)     General: 63 y.o. male in no acute distress. HEENT: Normocephalic and atraumatic. Sclera clear.  Neck: Supple. No carotid bruits. No JVD. Heart: *** RRR. Distinct S1 and S2. No murmurs, gallops, or rubs.  Lungs: No increased work of breathing. Clear to ausculation bilaterally. No wheezes, rhonchi, or rales.  Abdomen: Soft, non-distended, and non-tender to palpation.  Extremities: No lower extremity edema.  Radial and distal pedal pulses 2+ and equal bilaterally. Skin: Warm and dry. Neuro: No focal deficits. Psych: Normal affect. Responds appropriately.   Assessment:    No diagnosis found.  Plan:     Disposition:  Follow up in ***   Signed, Aline FORBES Door, PA-C  04/05/2024 10:02 PM    [facility]

## 2024-04-09 ENCOUNTER — Ambulatory Visit: Admitting: Cardiology

## 2024-04-09 NOTE — Telephone Encounter (Signed)
-----   Message from Thom LITTIE Sluder sent at 04/03/2024 11:12 AM EDT ----- Creatinine is up but not far away from baseline.  Continue current regimen please.  Can you please check up on him and see how his weights and urinary output is?  Please also let him know that I discussed his case with Dr. Jeffrie.  He would be agreeable to DCCV.  I will see him next week to see if he is euvolemic to proceed with this.  Will also order doxycycline 100 mg Liken to be taken 30 to 60 minutes before any dental work.  This is because of his valve repair. ----- Message ----- From: Interface, [facility] Lab Results In Sent: 04/02/2024   2:36 AM EDT To: Thom LITTIE Sluder, PA-C

## 2024-04-09 NOTE — Telephone Encounter (Signed)
 Called and spoke with patient who states his weight is good, currently 287lb and he's not been measuring his urine output, but he approximates that it's around 1L per day. Pt understands that DCCV will be discussed at upcoming visit. Routing to [place], [place] as an [place].

## 2024-04-13 ENCOUNTER — Other Ambulatory Visit: Payer: Self-pay | Admitting: Cardiology

## 2024-04-13 ENCOUNTER — Ambulatory Visit: Attending: Student | Admitting: Cardiology

## 2024-04-13 VITALS — BP 104/56 | HR 55 | Ht 71.0 in | Wt 295.8 lb

## 2024-04-13 DIAGNOSIS — I4819 Other persistent atrial fibrillation: Secondary | ICD-10-CM | POA: Diagnosis not present

## 2024-04-13 DIAGNOSIS — Z951 Presence of aortocoronary bypass graft: Secondary | ICD-10-CM

## 2024-04-13 DIAGNOSIS — Z952 Presence of prosthetic heart valve: Secondary | ICD-10-CM | POA: Diagnosis not present

## 2024-04-13 DIAGNOSIS — I5033 Acute on chronic diastolic (congestive) heart failure: Secondary | ICD-10-CM

## 2024-04-13 DIAGNOSIS — G4733 Obstructive sleep apnea (adult) (pediatric): Secondary | ICD-10-CM

## 2024-04-13 DIAGNOSIS — Z940 Kidney transplant status: Secondary | ICD-10-CM

## 2024-04-13 NOTE — Progress Notes (Signed)
 Cardiology Office Note:  .   Date:  04/13/2024  ID:  RAVINDER LUKEHART, DOB [birth year], MRN [id number] PCP: Vernadine Charlie ORN, MD  [facility] Providers Cardiologist:  Oneil Parchment, MD  Cardiology APP: Thom Sluder  History of Present Illness: .   JONIEL GRAUMANN is a 63 y.o. male with history of DEVONTE MIGUES is a 63 y.o. male with history of CAD status post CABG x1 (LIMA to LAD) and pericardial AVR 09/2021, chronic HFpEF, persistent atrial fibrillation, nephrectomy age 38 with subsequent ESRD post renal transplant 2018 with subsequent CKD 4, chronic UTI, uncontrolled diabetes, morbid obesity, hypothyroidism, OSA on BiPAP, CVA.     AVR and CABG x 1 Preprocedure right left heart catheterization showing multivessel disease, status post CABG x 1 Postoperative had atrial fibrillation and perioperative CVA with multiple embolic lesions noted on MRI.  He converted after getting amiodarone .   Most recent echocardiogram May 2024 with EF 65 to 70%.  Normal RV.  Stable AV function.   SVT Noted December 2017, aborted with vagal maneuvers Reportedly had EP study and attempted ablation but not able to be induced so no ablation performed. Although on his chart in 2018 shows ablation?   Chronic HFpEF Most recent echocardiogram May 2024, EF 65 to 70% with severe LVH.  Normal RV.  Mild MR.  .  LA mildly dilated.  Persistent atrial fibrillation Postoperative atrial fibrillation after AVR.   Had been maintaining sinus rhythm however, has been in A-fib since September 2024.  Social history Very diligent about his health and tracks metrics. Baseline weight approximately 285 to 290 pounds. Has cardia mobile device  Other  June 2025 amlodipine  and hydralazine  discontinued due to hypotension     Patient with history of AVR in CABG x 1 and persistent atrial fibrillation. For the past month we have been seeing patient for issues of DOE/hypotension with increased weight gain.  I saw him on  04/01/2024 after Dr. Parchment had increased his diuretic regiment, adding on metolazone 5 mg twice a week and torsemide 40 mg twice daily.  He was still volume up at follow-up appointment so I continued this regiment.  Today patient presents for follow-up.  I had asked him to track his weights and urinary output and blood pressures in which he did provide today.  He reports the doubling of the torsemide got him down to 287 pounds however he started developing cramps and completely stopped diuretic management and randomly started his hydralazine  and doxazosin  again.  He supposed to go back to his torsemide 20 mg twice daily for maintenance.  Today has soft BP in the low 100s.  Urine output has slowed in the past 3 days and weight has rebounded up to 295 pounds today.  However reports volume status has improved but still he is starting to reaccumulate but no significant complaints of shortness of breath, orthopnea.  Continues to remain in atrial fibrillation and has cardia mobile device.   ROS: Denies: Chest pain, shortness of breath, orthopnea, palpitations, decreased exercise intolerance, fatigue, lightheadedness.   Studies Reviewed: .         Risk Assessment/Calculations:    CHA2DS2-VASc Score = 6  This indicates a 9.7% annual risk of stroke. The patient's score is based upon: CHF History: 1 HTN History: 1 Diabetes History: 1 Stroke History: 2 Vascular Disease History: 1 Age Score: 0 Gender Score: 0            Physical Exam:   VS:  BP (!) 104/56 (BP Location: Left Arm, Patient Position: Sitting, Cuff Size: Large)   Pulse (!) 55   Ht 5' 11 (1.803 m)   Wt 295 lb 12.8 oz (134.2 kg)   SpO2 99%   BMI 41.26 kg/m    Wt Readings from Last 3 Encounters:  04/13/24 295 lb 12.8 oz (134.2 kg)  04/01/24 291 lb (132 kg)  03/25/24 (!) 311 lb (141.1 kg)    GEN: Well nourished, well developed in no acute distress NECK: + JVD; No carotid bruits CARDIAC: Irregularly irregular 2 out of 6 murmur   RESPIRATORY:  Clear to auscultation without rales, wheezing or rhonchi  ABDOMEN: Soft, non-tender, non-distended EXTREMITIES:  1+ edema worse in the left leg; No deformity   ASSESSMENT AND PLAN: .    Acute on chronic HFpEF Recently for the past month patient has been having issues with DOE/hypertension/weight gain.  Initially 311 pounds and he has had good diuresis on increasing regimen to torsemide 40 mg twice daily along with metolazone twice a week. He got as low as 287 pounds but completely stopped his diuretic regiment due to cramps when he was supposed to go back on torsemide 20 mg twice daily.  Today he is back up to 295 pounds with some mild peripheral edema. Can lie flat for DCCV. Restart torsemide 20 mg twice daily, he will provide us  weights and urinary output but may need to increase back up to 40 mg twice daily.  Continue metolazone 5 mg twice a week (Monday and Wednesday). Continue spironolactone  12.5 mg.  Of note does not take his potassium supplementation and has had stable potassiums even with up titration of diuretics. He will continue to keep blood pressure log, daily weights, urinary output and let me know metrics.  Check BMP today and magnesium .  Would prefer nephrology manages diuretics should his creatinine significantly increase.  Asked him to follow-up with them. Echocardiogram pending.  Of note he does have history of severe LVH, atrial fibrillation on his prior echocardiogram, no documentation of infiltrative disease however need to consider.     Hypotension Randomly restarted his hydralazine  and doxazosin .  BP is soft today in the low 100s. Reviewed intended regiment with him again.  Continue to hold amlodipine , hydralazine , doxazosin .   Continue Coreg  25 mg twice daily, if he needs more BP control in the future would increase his spironolactone  first.    Persistent atrial fibrillation Initially noted to be postoperative at the time of his AVR however he has been in  atrial fibrillation since at least September 2024.  Fortunately he is rate controlled with heart rates in the 50s today, this is also likely contributing to his HFpEF exacerbation.  Discussed with Dr. Jeffrie, we will pursue outpatient DCCV.  He deserves at least 1 attempt to restore NSR Continue with Eliquis  5 mg twice daily, carvedilol  25 mg twice daily.  Very diligent about taking his DOAC and never misses.   CAD status post CABG x 1 Hyperlipidemia Does not demonstrate any anginal symptoms.  Seems to be stable.  LDL well-controlled.  Goal less than 55.  Currently 52, April 2025. No aspirin  with Eliquis .  He can continue rosuvastatin  20 mg (CrCl>20ml/min), if we need more LDL controlled and would likely need to switch to high atorvastatin  80 mg.   AVR Stable aortic valve function on echocardiogram May 2024.  Mild aortic valve stenosis noted.  Mean gradient 11.  SBE ppx in setting of his renal insufficiency. They have recommended doxycycline   100mg  30-60 minutes before dental work.  Echocardiogram is pending and plan for 05/18/2024   Diabetes BMI 40 Followed by endocrinology, A1c uncontrolled 8.8%. Holding off on Mounjaro  until acute issues resolved.   CKD Status post renal transplant Followed by nephrology at [facility], needs to follow-up with nephrology On Cellcept , Belatacept , and Prednisone .   Chronic UTI Persistent issue due to Klebsiella pneumoniae.  Would not be candidate for SGLT2 inhibitor.   OSA on BiPAP Compliant.   Informed Consent   Shared Decision Making/Informed Consent  The risks (stroke, cardiac arrhythmias rarely resulting in the need for a temporary or permanent pacemaker, skin irritation or burns and complications associated with conscious sedation including aspiration, arrhythmia, respiratory failure and death), benefits (restoration of normal sinus rhythm) and alternatives of a direct current cardioversion were explained in detail to Mr. Wyne and he agrees to  proceed.          Dispo: Follow-up after cardioversion with me 07/28 (listed as Callie but I will see)  Signed, Thom LITTIE Sluder, PA-C

## 2024-04-13 NOTE — Patient Instructions (Signed)
 Medication Instructions:  Please restart your normal Torsemide Dose of 20 mg twice per day  *If you need a refill on your cardiac medications before your next appointment, please call your pharmacy*  Lab Work: Today: BMET and Magnesium  If you have labs (blood work) drawn today and your tests are completely normal, you will receive your results only by: MyChart Message (if you have MyChart) OR A paper copy in the mail If you have any lab test that is abnormal or we need to change your treatment, we will call you to review the results.  Testing/Procedures: Your physician has requested that you have a Cardioversion. During a Cardioversion sound waves are used to create images of your heart. It provides your doctor with information about the size and shape of your heart and how well your heart's chambers and valves are working. In this test, a transducer is attached to the end of a flexible tube that is guided down you throat and into your esophagus (the tube leading from your mouth to your stomach) to get a more detailed image of your heart. Once the Cardioversion has determined that a blood clot is not present, the cardioversion begins. Electrical Cardioversion uses a jolt of electricity to your heart either through paddles or wired patches attached to your chest. This is a controlled, usually prescheduled, procedure. This procedure is done at the hospital and you are not awake during the procedure. You usually go home the day of the procedure. Please see the instruction sheet given to you today for more information.   Follow-Up: At [facility], you and your health needs are our priority.  As part of our continuing mission to provide you with exceptional heart care, our providers are all part of one team.  This team includes your primary Cardiologist (physician) and Advanced Practice Providers or APPs (Physician Assistants and Nurse Practitioners) who all work together to provide you with the  care you need, when you need it.  Your next appointment:    1 week after your Cardioversion  Provider:    Thom Sluder, PA   Additional Instructions     Dear Robert [name]  You are scheduled for a Cardioversion on Tuesday, July 15 with Dr. Delford.  Please arrive at the [facility] (Main Entrance A) at [facility]: [address] [place], [place] [address] at 8:00 AM (This time is 1 hour(s) before your procedure to ensure your preparation).   Free valet parking service is available. You will check in at ADMITTING.   *Please Note: You will receive a call the day before your procedure to confirm the appointment time. That time may have changed from the original time based on the schedule for that day.*    DIET:  Nothing to eat or drink after midnight except a sip of water  with medications (see medication instructions below)  MEDICATION INSTRUCTIONS: !!IF ANY NEW MEDICATIONS ARE STARTED AFTER TODAY, PLEASE NOTIFY YOUR PROVIDER AS SOON AS POSSIBLE!!  FYI: Medications such as Semaglutide  (Ozempic , Wegovy), Tirzepatide  (Mounjaro , Zepbound ), Dulaglutide (Trulicity), etc (GLP1 agonists) AND Canagliflozin (Invokana), Dapagliflozin (Farxiga), Empagliflozin (Jardiance), Ertugliflozin (Steglatro), Bexagliflozin [employer]) or any combination with one of these drugs such as Invokamet (Canagliflozin/Metformin), Synjardy (Empagliflozin/Metformin), etc (SGLT2 inhibitors) must be held around the time of a procedure. This is not a comprehensive list of all of these drugs. Please review all of your medications and talk to your provider if you take any one of these. If you are not sure, ask  your provider.  {TIP  The patient is currently taking a GLP-1 agonist for diabetes and or weight loss. All GLP-1 Agonists must be held for procedures using anesthesia. They must be held for as many days as they are dosed. If taken 1 time a week, it must be held for 1 week. If taken once daily it is held for 1  day, etc. This is a current list of the patient's medications for diabetes for reference  Diabetic Medications as of 04/13/2024           insulin  aspart (NOVOLOG  FLEXPEN) 100 UNIT/ML FlexPen (Taking As Needed) Inject 5-10 Units into the skin as needed for high blood sugar. For hyperglycemia despite bolus via pump.   insulin  regular human CONCENTRATED (HUMULIN  R) 500 UNIT/ML injection (Taking Differently) Inject 0.25 mLs (125 Units total) into the skin every 3 (three) days. Omnipod   tirzepatide  (MOUNJARO ) 2.5 MG/0.5ML Pen (Not Taking) Inject 2.5 mg into the skin once a week for 4 weeks and then increase to 5 mg weekly.   tirzepatide  (MOUNJARO ) 5 MG/0.5ML Pen (Not Taking) Inject 5 mg into the skin once a week. After 4 weeks of 2.5mg  dose.             :Continue taking your anticoagulant (blood thinner): Apixaban  (Eliquis ).  You will need to continue this after your procedure until you are told by your provider that it is safe to stop.    LABS: Labs completed today. FYI:  For your safety, and to allow us  to monitor your vital signs accurately during the surgery/procedure we request: If you have artificial nails, gel coating, SNS etc, please have those removed prior to your surgery/procedure. Not having the nail coverings /polish removed may result in cancellation or delay of your surgery/procedure.  Your support person will be asked to wait in the waiting room during your procedure.  It is OK to have someone drop you off and come back when you are ready to be discharged.  You cannot drive after the procedure and will need someone to drive you home.  Bring your insurance cards.  *Special Note: Every effort is made to have your procedure done on time. Occasionally there are emergencies that occur at the hospital that may cause delays. Please be patient if a delay does occur.

## 2024-04-13 NOTE — H&P (View-Only) (Signed)
 Cardiology Office Note:  .   Date:  04/13/2024  ID:  RAVINDER LUKEHART, DOB [birth year], MRN [id number] PCP: Vernadine Charlie ORN, MD  [facility] Providers Cardiologist:  Oneil Parchment, MD  Cardiology APP: Thom Sluder  History of Present Illness: .   JONIEL GRAUMANN is a 63 y.o. male with history of DEVONTE MIGUES is a 63 y.o. male with history of CAD status post CABG x1 (LIMA to LAD) and pericardial AVR 09/2021, chronic HFpEF, persistent atrial fibrillation, nephrectomy age 38 with subsequent ESRD post renal transplant 2018 with subsequent CKD 4, chronic UTI, uncontrolled diabetes, morbid obesity, hypothyroidism, OSA on BiPAP, CVA.     AVR and CABG x 1 Preprocedure right left heart catheterization showing multivessel disease, status post CABG x 1 Postoperative had atrial fibrillation and perioperative CVA with multiple embolic lesions noted on MRI.  He converted after getting amiodarone .   Most recent echocardiogram May 2024 with EF 65 to 70%.  Normal RV.  Stable AV function.   SVT Noted December 2017, aborted with vagal maneuvers Reportedly had EP study and attempted ablation but not able to be induced so no ablation performed. Although on his chart in 2018 shows ablation?   Chronic HFpEF Most recent echocardiogram May 2024, EF 65 to 70% with severe LVH.  Normal RV.  Mild MR.  .  LA mildly dilated.  Persistent atrial fibrillation Postoperative atrial fibrillation after AVR.   Had been maintaining sinus rhythm however, has been in A-fib since September 2024.  Social history Very diligent about his health and tracks metrics. Baseline weight approximately 285 to 290 pounds. Has cardia mobile device  Other  June 2025 amlodipine  and hydralazine  discontinued due to hypotension     Patient with history of AVR in CABG x 1 and persistent atrial fibrillation. For the past month we have been seeing patient for issues of DOE/hypotension with increased weight gain.  I saw him on  04/01/2024 after Dr. Parchment had increased his diuretic regiment, adding on metolazone 5 mg twice a week and torsemide 40 mg twice daily.  He was still volume up at follow-up appointment so I continued this regiment.  Today patient presents for follow-up.  I had asked him to track his weights and urinary output and blood pressures in which he did provide today.  He reports the doubling of the torsemide got him down to 287 pounds however he started developing cramps and completely stopped diuretic management and randomly started his hydralazine  and doxazosin  again.  He supposed to go back to his torsemide 20 mg twice daily for maintenance.  Today has soft BP in the low 100s.  Urine output has slowed in the past 3 days and weight has rebounded up to 295 pounds today.  However reports volume status has improved but still he is starting to reaccumulate but no significant complaints of shortness of breath, orthopnea.  Continues to remain in atrial fibrillation and has cardia mobile device.   ROS: Denies: Chest pain, shortness of breath, orthopnea, palpitations, decreased exercise intolerance, fatigue, lightheadedness.   Studies Reviewed: .         Risk Assessment/Calculations:    CHA2DS2-VASc Score = 6  This indicates a 9.7% annual risk of stroke. The patient's score is based upon: CHF History: 1 HTN History: 1 Diabetes History: 1 Stroke History: 2 Vascular Disease History: 1 Age Score: 0 Gender Score: 0            Physical Exam:   VS:  BP (!) 104/56 (BP Location: Left Arm, Patient Position: Sitting, Cuff Size: Large)   Pulse (!) 55   Ht 5' 11 (1.803 m)   Wt 295 lb 12.8 oz (134.2 kg)   SpO2 99%   BMI 41.26 kg/m    Wt Readings from Last 3 Encounters:  04/13/24 295 lb 12.8 oz (134.2 kg)  04/01/24 291 lb (132 kg)  03/25/24 (!) 311 lb (141.1 kg)    GEN: Well nourished, well developed in no acute distress NECK: + JVD; No carotid bruits CARDIAC: Irregularly irregular 2 out of 6 murmur   RESPIRATORY:  Clear to auscultation without rales, wheezing or rhonchi  ABDOMEN: Soft, non-tender, non-distended EXTREMITIES:  1+ edema worse in the left leg; No deformity   ASSESSMENT AND PLAN: .    Acute on chronic HFpEF Recently for the past month patient has been having issues with DOE/hypertension/weight gain.  Initially 311 pounds and he has had good diuresis on increasing regimen to torsemide 40 mg twice daily along with metolazone twice a week. He got as low as 287 pounds but completely stopped his diuretic regiment due to cramps when he was supposed to go back on torsemide 20 mg twice daily.  Today he is back up to 295 pounds with some mild peripheral edema. Can lie flat for DCCV. Restart torsemide 20 mg twice daily, he will provide us  weights and urinary output but may need to increase back up to 40 mg twice daily.  Continue metolazone 5 mg twice a week (Monday and Wednesday). Continue spironolactone  12.5 mg.  Of note does not take his potassium supplementation and has had stable potassiums even with up titration of diuretics. He will continue to keep blood pressure log, daily weights, urinary output and let me know metrics.  Check BMP today and magnesium .  Would prefer nephrology manages diuretics should his creatinine significantly increase.  Asked him to follow-up with them. Echocardiogram pending.  Of note he does have history of severe LVH, atrial fibrillation on his prior echocardiogram, no documentation of infiltrative disease however need to consider.     Hypotension Randomly restarted his hydralazine  and doxazosin .  BP is soft today in the low 100s. Reviewed intended regiment with him again.  Continue to hold amlodipine , hydralazine , doxazosin .   Continue Coreg  25 mg twice daily, if he needs more BP control in the future would increase his spironolactone  first.    Persistent atrial fibrillation Initially noted to be postoperative at the time of his AVR however he has been in  atrial fibrillation since at least September 2024.  Fortunately he is rate controlled with heart rates in the 50s today, this is also likely contributing to his HFpEF exacerbation.  Discussed with Dr. Jeffrie, we will pursue outpatient DCCV.  He deserves at least 1 attempt to restore NSR Continue with Eliquis  5 mg twice daily, carvedilol  25 mg twice daily.  Very diligent about taking his DOAC and never misses.   CAD status post CABG x 1 Hyperlipidemia Does not demonstrate any anginal symptoms.  Seems to be stable.  LDL well-controlled.  Goal less than 55.  Currently 52, April 2025. No aspirin  with Eliquis .  He can continue rosuvastatin  20 mg (CrCl>20ml/min), if we need more LDL controlled and would likely need to switch to high atorvastatin  80 mg.   AVR Stable aortic valve function on echocardiogram May 2024.  Mild aortic valve stenosis noted.  Mean gradient 11.  SBE ppx in setting of his renal insufficiency. They have recommended doxycycline   100mg  30-60 minutes before dental work.  Echocardiogram is pending and plan for 05/18/2024   Diabetes BMI 40 Followed by endocrinology, A1c uncontrolled 8.8%. Holding off on Mounjaro  until acute issues resolved.   CKD Status post renal transplant Followed by nephrology at [facility], needs to follow-up with nephrology On Cellcept , Belatacept , and Prednisone .   Chronic UTI Persistent issue due to Klebsiella pneumoniae.  Would not be candidate for SGLT2 inhibitor.   OSA on BiPAP Compliant.   Informed Consent   Shared Decision Making/Informed Consent  The risks (stroke, cardiac arrhythmias rarely resulting in the need for a temporary or permanent pacemaker, skin irritation or burns and complications associated with conscious sedation including aspiration, arrhythmia, respiratory failure and death), benefits (restoration of normal sinus rhythm) and alternatives of a direct current cardioversion were explained in detail to Mr. Wyne and he agrees to  proceed.          Dispo: Follow-up after cardioversion with me 07/28 (listed as Callie but I will see)  Signed, Thom LITTIE Sluder, PA-C

## 2024-04-14 LAB — BASIC METABOLIC PANEL WITH GFR
BUN/Creatinine Ratio: 21 (ref 10–24)
BUN: 52 mg/dL — ABNORMAL HIGH (ref 8–27)
CO2: 18 mmol/L — ABNORMAL LOW (ref 20–29)
Calcium: 10.8 mg/dL — ABNORMAL HIGH (ref 8.6–10.2)
Chloride: 104 mmol/L (ref 96–106)
Creatinine, Ser: 2.53 mg/dL — ABNORMAL HIGH (ref 0.76–1.27)
Glucose: 168 mg/dL — ABNORMAL HIGH (ref 70–99)
Potassium: 4.8 mmol/L (ref 3.5–5.2)
Sodium: 146 mmol/L — ABNORMAL HIGH (ref 134–144)
eGFR: 28 mL/min/1.73 — ABNORMAL LOW (ref >59)

## 2024-04-14 LAB — MAGNESIUM: Magnesium: 2.2 mg/dL (ref 1.6–2.3)

## 2024-04-15 ENCOUNTER — Encounter: Payer: Self-pay | Admitting: Podiatry

## 2024-04-15 ENCOUNTER — Ambulatory Visit: Admitting: General Practice

## 2024-04-15 ENCOUNTER — Ambulatory Visit: Admitting: Podiatry

## 2024-04-15 ENCOUNTER — Ambulatory Visit: Payer: Self-pay | Admitting: Cardiology

## 2024-04-15 DIAGNOSIS — B351 Tinea unguium: Secondary | ICD-10-CM | POA: Diagnosis not present

## 2024-04-15 DIAGNOSIS — M79674 Pain in right toe(s): Secondary | ICD-10-CM | POA: Diagnosis not present

## 2024-04-15 DIAGNOSIS — M79675 Pain in left toe(s): Secondary | ICD-10-CM

## 2024-04-15 DIAGNOSIS — N189 Chronic kidney disease, unspecified: Secondary | ICD-10-CM

## 2024-04-15 DIAGNOSIS — E1149 Type 2 diabetes mellitus with other diabetic neurological complication: Secondary | ICD-10-CM

## 2024-04-15 DIAGNOSIS — L97501 Non-pressure chronic ulcer of other part of unspecified foot limited to breakdown of skin: Secondary | ICD-10-CM | POA: Diagnosis not present

## 2024-04-15 NOTE — Progress Notes (Signed)
 This patient returns to my office for at risk foot care.  This patient requires this care by a professional since this patient will be at risk due to having uncontrolled diabetes and kidney transplant.  This patient is unable to cut nails himself since the patient cannot reach his nails.These nails are painful walking and wearing shoes.  Patient says he lost his second toenail right foot on Friday.  The toe is presently healing uneventfully.   This patient presents for at risk foot care today.  General Appearance  Alert, conversant and in no acute stress.  Vascular  Dorsalis pedis and posterior tibial  pulses are palpable  bilaterally.  Capillary return is within normal limits  bilaterally. Temperature is within normal limits  bilaterally.  Neurologic  Senn-Weinstein monofilament wire test diminished   bilaterally. Muscle power within normal limits bilaterally.  Nails Thick disfigured discolored nails with subungual debris  from hallux to fifth toes bilaterally. No evidence of bacterial infection or drainage bilaterally.  Orthopedic  No limitations of motion  feet .  No crepitus or effusions noted.  No bony pathology or digital deformities noted. HAV  B/L.  Skin  normotropic skin with no porokeratosis noted bilaterally.  No signs of infections or ulcers noted.   Ulceration on nail bed second toe right due to self-avulsion.  Onychomycosis  Pain in right toes  Pain in left toes  Ulcer second toe right foot.  Consent was obtained for treatment procedures.   Mechanical debridement of nails 1-5  bilaterally performed with a nail nipper.  Filed with dremel without incident.  Subungual hematoma second toe right foot.   Bandage/DSD.   Return office visit  3 months                   Told patient to return for periodic foot care and evaluation due to potential at risk complications.   Cordella Bold DPM

## 2024-04-16 ENCOUNTER — Other Ambulatory Visit: Payer: Self-pay | Admitting: Cardiovascular Disease

## 2024-04-20 NOTE — Progress Notes (Signed)
 Pt called for pre procedure instructions. Arrival time 0930 NPO after midnight explained Instructed to take am meds with sip of water and confirmed blood thinner consistency, Eliquis  Instructed pt need for ride home tomorrow and have responsible adult with them for 24 hrs post procedure.

## 2024-04-21 ENCOUNTER — Ambulatory Visit (HOSPITAL_COMMUNITY): Admitting: Anesthesiology

## 2024-04-21 ENCOUNTER — Encounter (HOSPITAL_COMMUNITY)
Admission: RE | Disposition: A | Discharge status: Home / Self Care | Payer: Self-pay | Source: Home / Self Care | Attending: Cardiovascular Disease

## 2024-04-21 ENCOUNTER — Encounter (HOSPITAL_COMMUNITY): Payer: Self-pay | Admitting: Cardiovascular Disease

## 2024-04-21 ENCOUNTER — Other Ambulatory Visit: Payer: Self-pay

## 2024-04-21 ENCOUNTER — Ambulatory Visit (HOSPITAL_COMMUNITY)
Admission: RE | Admit: 2024-04-21 | Discharge: 2024-04-21 | Disposition: A | Discharge status: Home / Self Care | Attending: Cardiovascular Disease | Admitting: Cardiovascular Disease

## 2024-04-21 DIAGNOSIS — Z7901 Long term (current) use of anticoagulants: Secondary | ICD-10-CM | POA: Diagnosis not present

## 2024-04-21 DIAGNOSIS — E1122 Type 2 diabetes mellitus with diabetic chronic kidney disease: Secondary | ICD-10-CM | POA: Insufficient documentation

## 2024-04-21 DIAGNOSIS — Z905 Acquired absence of kidney: Secondary | ICD-10-CM | POA: Insufficient documentation

## 2024-04-21 DIAGNOSIS — I4819 Other persistent atrial fibrillation: Secondary | ICD-10-CM

## 2024-04-21 DIAGNOSIS — E785 Hyperlipidemia, unspecified: Secondary | ICD-10-CM | POA: Insufficient documentation

## 2024-04-21 DIAGNOSIS — I2510 Atherosclerotic heart disease of native coronary artery without angina pectoris: Secondary | ICD-10-CM | POA: Diagnosis not present

## 2024-04-21 DIAGNOSIS — N186 End stage renal disease: Secondary | ICD-10-CM | POA: Diagnosis not present

## 2024-04-21 DIAGNOSIS — Z952 Presence of prosthetic heart valve: Secondary | ICD-10-CM | POA: Diagnosis not present

## 2024-04-21 DIAGNOSIS — I5033 Acute on chronic diastolic (congestive) heart failure: Secondary | ICD-10-CM | POA: Diagnosis not present

## 2024-04-21 DIAGNOSIS — G4733 Obstructive sleep apnea (adult) (pediatric): Secondary | ICD-10-CM | POA: Diagnosis not present

## 2024-04-21 DIAGNOSIS — Z79899 Other long term (current) drug therapy: Secondary | ICD-10-CM | POA: Insufficient documentation

## 2024-04-21 DIAGNOSIS — Z940 Kidney transplant status: Secondary | ICD-10-CM | POA: Diagnosis not present

## 2024-04-21 DIAGNOSIS — Z87891 Personal history of nicotine dependence: Secondary | ICD-10-CM | POA: Diagnosis not present

## 2024-04-21 DIAGNOSIS — Z951 Presence of aortocoronary bypass graft: Secondary | ICD-10-CM | POA: Diagnosis not present

## 2024-04-21 DIAGNOSIS — I10 Essential (primary) hypertension: Secondary | ICD-10-CM

## 2024-04-21 HISTORY — PX: CARDIOVERSION: EP1203

## 2024-04-21 SURGERY — CARDIOVERSION (CATH LAB)
Anesthesia: General

## 2024-04-21 MED ORDER — LIDOCAINE 2% (20 MG/ML) 5 ML SYRINGE
INTRAMUSCULAR | Status: DC | PRN
  Administered 2024-04-21: 100 mg via INTRAVENOUS

## 2024-04-21 MED ORDER — PROPOFOL 10 MG/ML IV BOLUS
INTRAVENOUS | Status: DC | PRN
  Administered 2024-04-21: 20 mg via INTRAVENOUS
  Administered 2024-04-21: 50 mg via INTRAVENOUS

## 2024-04-21 MED ORDER — SODIUM CHLORIDE 0.9 % IV SOLN: INTRAVENOUS | Status: DC

## 2024-04-21 SURGICAL SUPPLY — 1 items: PAD DEFIB RADIO PHYSIO CONN (PAD) ×1 IMPLANT

## 2024-04-21 NOTE — Anesthesia Postprocedure Evaluation (Signed)
 Anesthesia Post Note  Patient: Robert [name]  Procedure(s) Performed: CARDIOVERSION     Patient location during evaluation: Cath Lab Anesthesia Type: General Level of consciousness: awake and alert Pain management: pain level controlled Vital Signs Assessment: post-procedure vital signs reviewed and stable Respiratory status: spontaneous breathing, nonlabored ventilation and respiratory function stable Cardiovascular status: blood pressure returned to baseline and stable Postop Assessment: no apparent nausea or vomiting Anesthetic complications: no   No notable events documented.  Last Vitals:  Vitals:   04/21/24 1105 04/21/24 1110  BP: 132/65 125/87  Pulse: (!) 59 (!) 57  Resp: 20 17  Temp:    SpO2: 99% 100%    Last Pain:  Vitals:   04/21/24 1105  TempSrc:   PainSc: 0-No pain                 Bridney Guadarrama,W. EDMOND

## 2024-04-21 NOTE — CV Procedure (Signed)
 [facility]: Anesthesia:  Dr Keneth propofol  On RX Eliquis  no missed doses  DCC x 1 300J biphasic Converted from afib rate 68 to NSR rate 62 bpm  No immediate neurologic sequelae  Maude Emmer MD [facility]

## 2024-04-21 NOTE — Progress Notes (Deleted)
 Cardiology Office Note:    Date:  04/21/2024   ID:  Robert [name], DOB 09-08-1961, MRN [id number]  PCP:  Vernadine Charlie ORN, MD  Cardiologist:  Oneil Parchment, MD { Click to update primary MD,subspecialty MD or APP then REFRESH:1}    Referring MD: [name], Charlie ORN, MD   Chief Complaint: follow-up of CHF and atrial fibrillation  History of Present Illness:    Robert [name] is a 63 y.o. male with a history of CAD s/p CABG x1 (LIMA-LAD) and pericardial AVR for aortic stenosis in 09/2021, chronic HFpEF, persistent atrial fibrillation s/p DCCV on 04/21/2024 on Eliquis , SVT with negative EP study in 11/2016 (unable to induce arrhythmias at that time), CVA, hypertension, hyperlipidemia, type 2 diabetes mellitus, hypothyroidism, congenital non-functioning left kidney s/p nephrectomy in 2014 with subsequent ESRD s/p renal transplant in 2018 now with CKD stage IV, anemia of chronic disease, obstructive sleep apnea on BiPAP, and chronic UTI who is followed by Dr. Parchment and presents today for follow-up of CHF and atrial fibrillation.  Patient underwent R/ LHC in 08/2021 after routine Echo in 04/2021 for aortic valve disease showed moderate to severe aortic stenosis. Cath showed 80% stenosis of distal left main to proximal LAD followed by 90% stenosis of proximal to mid LAD as well as 50% stenosis of OM2, 20% stenosis of lateral OM2, and 80% stenosis of RPAV. He was referred to CT surgery and underwent a single vessel CABG with LIMA to LAD as well as pericardial AVR in 09/2021 with Dr. Lucas. Post-op course was complicated by atrial fibrillation and embolic CVA.and he was started on anticoagulation with Coumadin . He was later switched to Eliquis . Last Echo in 05/2023 showed LVEF of 65-70% with severe LVH, mildly enlarged RV with normal RV function, severe MAC with mild MR, and normal structure and function of the aortic valve prosthesis (mean gradient 11.0 mmHg).  Patient was seen by Dr. Parchment on  03/25/2024 at which time he reported significant dyspnea on exertion and significant fluid retention and weight gain. He had been non-compliant with his Torsemide and Metolazone.He also reported issues with hypotension. He was noted to be in rate controlled atrial fibrillation at that time. Amlodipine  and Hydralazine  were stopped and his Torsemide was increased to 40mg  twice daily for 4 days with plans to then decreased back to once daily. He was also instructed to take Metolazone twice a week (Mondays and Thursdays) for 3 weeks.   He was seen back in the office by Thom Sluder, PA-C, on 04/01/2024 at which time he reported very brisk diuresis and improvement in his volume status. His weight was down 20 lbs. Repeat Echo was ordered and is scheduled for 05/2024.  He was last seen by Thom Sluder, PA-C, on 04/13/2024 at which time he had stopped taking his diuretics a few days before after developing cramps and is weight was up about 8 lbs. However, symptomatically he was feeling well. He remained in atrial fibrillation at that time. He was restarted on his Torsemide. Outpatient DCCV was also arranged and performed on 04/21/2024 with restoration of sinus rhythm.   Patient presents today for follow-up. ***  Chronic Diastolic CHF Last Echo in 05/2023 showed  LVEF of 65-70% with severe LVH as well as mildly enlarged RV with normal RV function. He has been seen multiple times in our office recently for acute diastolic CHF in setting of non-compliance with oral diuretics. He responded well with resumptions of these.  *** - Continue Torsemide 20mg   twice daily and Metolazone 5mg  twice a week. - Continue Spironolactone  12.5mg  daily.  - No SGLT2 inhibitor given chronic UTI.  - Continue daily weights and sodium/ fluid restrictions.  - Repeat Echo was ordered at last visit and is scheduled for next month.   Persistent Atrial Fibrillation S/p DCCV on 04/21/2024 with restoration of sinus rhythm.  - Maintaining sinus  rhythm. *** - Continue Coreg  25mg  twice daily.  - Continue Eliquis  5mg  twice daily.  CAD s/p CABG S/p single vessel CAD with LIMA to LAD in 09/2021.  - No chest pain.  - No aspirin  given need for Eliquis .  - Continue statin.   Aortic Stenosis s/p AVR S/p pericardial AVR in 09/2021 at time of CABG. Last Echo in 05/2023 showed normal structure and function of the aortic valve prosthesis (mean gradient 11.0 mmHg). - Continue SBE prophylaxis with Doxycyline prior to dental procedure.  - Can reassess on repeat Echo which is scheduled for next month.   Mild Mitral Regurgitation Last Echo in 05/2023 showed severe PAC with mild MR.  - Can reassess on repeat Echo which is scheduled for next month.  Hypertension History of hypertension but he has had more issues with hypotension lately. *** - Continue Coreg  and Spironolactone  as above.   Hyperlipidemia Lipid panel in 01/2024: Total Cholesterol 114, Triglycerides 85, HDL 45, LDL 52. LDL goal <55.  - Continue Crestor  20mg  daily. CrCl > 45 so okay to continue current dose.   Type 2 Diabetes Mellitus Hemoglobin A1c 8.8 in 01/2024.  - On Insulin .  - Management per PCP.  Congenital Non-Functioning Left Kidney S/p Nephrectomy in 2014 ESRD s/p Renal Transplant in 2018 with Subsequent CKD Stage IV Baseline creatinine around 2.5 to 3.0. Last creatinine 2.53 on 04/13/2024.   Obstructive Sleep Apnea - Continue BiPAP.  EKGs/Labs/Other Studies Reviewed:    The following studies were reviewed:  Echocardiogram 05/21/2023: Impressions: 1. Left ventricular ejection fraction, by estimation, is 65 to 70%. The  left ventricle has normal function. The left ventricle has no regional  wall motion abnormalities. There is severe left ventricular hypertrophy.  Left ventricular diastolic parameters   are indeterminate.   2. Right ventricular systolic function is normal. The right ventricular  size is mildly enlarged.   3. Left atrial size was mildly dilated.    4. The mitral valve is normal in structure. Mild mitral valve  regurgitation. No evidence of mitral stenosis. Severe mitral annular  calcification.   5. The aortic valve has been repaired/replaced. Aortic valve  regurgitation is not visualized. Mild aortic valve stenosis. There is a 27  mm Edwards Inspiris Resilia Prosthesis valve present in the aortic  position. Echo findings are consistent with normal   structure and function of the aortic valve prosthesis. Aortic valve mean  gradient measures 11.0 mmHg. Aortic valve Vmax measures 2.23 m/s.   6. The inferior vena cava is dilated in size with >50% respiratory  variability, suggesting right atrial pressure of 8 mmHg.   Comparison(s): No significant change from prior study. Prior images  reviewed side by side.   EKG:  EKG ordered today. EKG personally reviewed and demonstrates ***.  Recent Labs: 06/24/2023: BNP 207.7 07/04/2023: NT-Pro BNP 2,326 04/01/2024: ALT 13; Hemoglobin 12.6; Platelets 249; TSH 2.360 04/13/2024: BUN 52; Creatinine, Ser 2.53; Magnesium  2.2; Potassium 4.8; Sodium 146  Recent Lipid Panel    Component Value Date/Time   CHOL 114 01/08/2024 1420   TRIG 85 01/08/2024 1420   HDL 45 01/08/2024 1420  CHOLHDL 2.5 01/08/2024 1420   VLDL 9 05/02/2022 0334   LDLCALC 52 01/08/2024 1420    Physical Exam:    Vital Signs: There were no vitals taken for this visit.    Wt Readings from Last 3 Encounters:  04/21/24 (!) 305 lb (138.3 kg)  04/13/24 295 lb 12.8 oz (134.2 kg)  04/01/24 291 lb (132 kg)     General: 63 y.o. male in no acute distress. HEENT: Normocephalic and atraumatic. Sclera clear.  Neck: Supple. No carotid bruits. No JVD. Heart: *** RRR. Distinct S1 and S2. No murmurs, gallops, or rubs.  Lungs: No increased work of breathing. Clear to ausculation bilaterally. No wheezes, rhonchi, or rales.  Abdomen: Soft, non-distended, and non-tender to palpation.  Extremities: No lower extremity edema.  Radial and  distal pedal pulses 2+ and equal bilaterally. Skin: Warm and dry. Neuro: No focal deficits. Psych: Normal affect. Responds appropriately.   Assessment:    No diagnosis found.  Plan:     Disposition: Follow up in ***   Signed, Aline FORBES Door, PA-C  04/21/2024 10:02 PM    [facility]

## 2024-04-21 NOTE — Interval H&P Note (Signed)
 History and Physical Interval Note:  04/21/2024 9:58 AM  Robert [name]  has presented today for surgery, with the diagnosis of AFIB.  The various methods of treatment have been discussed with the patient and family. After consideration of risks, benefits and other options for treatment, the patient has consented to  Procedure(s): CARDIOVERSION (N/A) as a surgical intervention.  The patient's history has been reviewed, patient examined, no change in status, stable for surgery.  I have reviewed the patient's chart and labs.  Questions were answered to the patient's satisfaction.     Maude Emmer

## 2024-04-21 NOTE — Transfer of Care (Signed)
 Immediate Anesthesia Transfer of Care Note  Patient: Robert [name]  Procedure(s) Performed: CARDIOVERSION  Patient Location: PACU  Anesthesia Type:MAC  Level of Consciousness: drowsy  Airway & Oxygen Therapy: Patient Spontanous Breathing and Patient connected to nasal cannula oxygen  Post-op Assessment: Report given to RN and Post -op Vital signs reviewed and stable  Post vital signs: Reviewed and stable  Last Vitals:  Vitals Value Taken Time  BP    Temp    Pulse 69 04/21/24 10:14  Resp 16 04/21/24 10:14  SpO2 100 % 04/21/24 10:14  Vitals shown include unfiled device data.  Last Pain:  Vitals:   04/21/24 1015  TempSrc:   PainSc: 0-No pain         Complications: No notable events documented.

## 2024-04-21 NOTE — Anesthesia Preprocedure Evaluation (Addendum)
 Anesthesia Evaluation  Patient identified by MRN, date of birth, ID band Patient awake    Reviewed: Allergy & Precautions, H&P , NPO status , Patient's Chart, lab work & pertinent test results  History of Anesthesia Complications (+) DIFFICULT AIRWAY and history of anesthetic complications  Airway Mallampati: III  TM Distance: >3 FB Neck ROM: Full    Dental no notable dental hx. (+) Teeth Intact, Dental Advisory Given   Pulmonary sleep apnea and Continuous Positive Airway Pressure Ventilation , former smoker   Pulmonary exam normal breath sounds clear to auscultation       Cardiovascular hypertension, Pt. on medications and Pt. on home beta blockers + CAD and + CABG  + dysrhythmias Atrial Fibrillation + Valvular Problems/Murmurs AS  Rhythm:Irregular Rate:Normal     Neuro/Psych  Headaches TIACVA  negative psych ROS   GI/Hepatic negative GI ROS, Neg liver ROS,,,  Endo/Other  diabetes, Insulin  Dependent, Oral Hypoglycemic AgentsHypothyroidism    Renal/GU ESRF and DialysisRenal disease  negative genitourinary   Musculoskeletal  (+) Arthritis , Osteoarthritis,    Abdominal   Peds  Hematology  (+) Blood dyscrasia, anemia   Anesthesia Other Findings   Reproductive/Obstetrics negative OB ROS                              Anesthesia Physical Anesthesia Plan  ASA: 3  Anesthesia Plan: General   Post-op Pain Management: Minimal or no pain anticipated   Induction: Intravenous  PONV Risk Score and Plan: 2 and Propofol  infusion and Treatment may vary due to age or medical condition  Airway Management Planned: Natural Airway and Simple Face Mask  Additional Equipment:   Intra-op Plan:   Post-operative Plan:   Informed Consent: I have reviewed the patients History and Physical, chart, labs and discussed the procedure including the risks, benefits and alternatives for the proposed anesthesia  with the patient or authorized representative who has indicated his/her understanding and acceptance.     Dental advisory given  Plan Discussed with: CRNA  Anesthesia Plan Comments:          Anesthesia Quick Evaluation

## 2024-04-26 ENCOUNTER — Other Ambulatory Visit: Payer: Self-pay | Admitting: Endocrinology

## 2024-04-26 DIAGNOSIS — E291 Testicular hypofunction: Secondary | ICD-10-CM

## 2024-04-27 NOTE — Telephone Encounter (Signed)
 Refill request complete

## 2024-05-04 ENCOUNTER — Ambulatory Visit: Attending: Student | Admitting: Cardiology

## 2024-05-04 VITALS — BP 90/60 | HR 70 | Ht 71.0 in | Wt 287.0 lb

## 2024-05-04 DIAGNOSIS — I5032 Chronic diastolic (congestive) heart failure: Secondary | ICD-10-CM | POA: Diagnosis not present

## 2024-05-04 DIAGNOSIS — Z951 Presence of aortocoronary bypass graft: Secondary | ICD-10-CM | POA: Diagnosis not present

## 2024-05-04 DIAGNOSIS — I4819 Other persistent atrial fibrillation: Secondary | ICD-10-CM | POA: Diagnosis not present

## 2024-05-04 DIAGNOSIS — Z940 Kidney transplant status: Secondary | ICD-10-CM

## 2024-05-04 DIAGNOSIS — G4733 Obstructive sleep apnea (adult) (pediatric): Secondary | ICD-10-CM

## 2024-05-04 DIAGNOSIS — Z952 Presence of prosthetic heart valve: Secondary | ICD-10-CM

## 2024-05-04 DIAGNOSIS — I10 Essential (primary) hypertension: Secondary | ICD-10-CM

## 2024-05-04 NOTE — Progress Notes (Addendum)
 Cardiology Office Note:  .   Date:  05/04/2024  ID:  Robert [name], DOB 03-09-1961, MRN [id number] PCP: Vernadine Charlie ORN, MD  [facility] Providers Cardiologist:  Oneil Parchment, MD {  History of Present Illness: .   Robert [name] is a 63 y.o. male with history of CAD status post CABG x1 (LIMA to LAD) and  AVR 09/2021, chronic HFpEF, persistent atrial fibrillation status post successful DCCV 04/2024, nephrectomy age 64 with subsequent ESRD post renal transplant 2018 with subsequent CKD 4, chronic UTI, uncontrolled diabetes, morbid obesity, hypothyroidism, OSA on BiPAP, CVA.     AVR and CABG x 1 Preprocedure right left heart catheterization showing multivessel disease, status post CABG x 1 Postoperative had atrial fibrillation and perioperative CVA with multiple embolic lesions noted on MRI.  He converted after getting amiodarone .   Most recent echocardiogram May 2024 with EF 65 to 70%.  Normal RV.  Stable AV function.   SVT Noted December 2017, aborted with vagal maneuvers Reportedly had EP study and attempted ablation but not able to be induced so no ablation performed. Although on his chart in 2018 shows ablation?   Chronic HFpEF Most recent echocardiogram May 2024, EF 65 to 70% with severe LVH.  Normal RV.  Mild MR. LA mildly dilated.   Persistent atrial fibrillation Postoperative atrial fibrillation after AVR.   Had been maintaining sinus rhythm however, has been in A-fib since September 2024. Successful DCCV 04/21/2024, volume status showing significant improvement.    Social history Very diligent about his health and tracks metrics. Baseline weight approximately 285 to 290 pounds. Has cardia mobile device   Other  June 2025 amlodipine , doxazosin , hydralazine  discontinued due to hypotension Intermittently forgetful.  Sometimes continues meds by mistake.     Patient with history of AVR/CABG x 1 and persistent atrial fibrillation. For the past month or so we have  been seeing patient for issues of DOE/hypotension with increased weight gain.  I saw him on 07/07 when he reported brisk diuresis with up titration of his torsemide (40 mg twice daily). He was supposed to go back to his usual 20 mg twice daily however had completely stopped his torsemide a few days prior to seeing me and had weight gain back up to 295 pounds, was as low as 287.  Still though volume status was improving.  I continued him on torsemide 20 mg twice daily.  He was still in A-fib so we planned for DCCV which was successful on 7/15.  Today patient presents for follow-up after his successful DCCV.  He reports that instantly he felt much better, felt like he could take a deeper breath and that there was air in the room.  Dyspnea on exertion started to improve.  Weight and volume status also significantly improved.  Today back down to 287 pounds.  For some reason he continued his hydralazine  and has had issues of hypotension.  Today is 90/60.  Otherwise feels good, has really been working hard to increase his functional capacity.  Right now really only able to walk halfway across the room.  However he has been using less and less mechanical assistance.  ROS: Denies: Chest pain, shortness of breath, orthopnea, peripheral edema, palpitations, decreased exercise intolerance, fatigue, lightheadedness.   Studies Reviewed: SABRA    EKG Interpretation Date/Time:  Monday May 04 2024 13:55:02 EDT Ventricular Rate:  70 PR Interval:  178 QRS Duration:  96 QT Interval:  460 QTC Calculation: 496 R Axis:  4  Text Interpretation: Normal sinus rhythm still in sinus post DCCV. Confirmed by Darryle Currier [phone]) on 05/04/2024 1:57:48 PM    Risk Assessment/Calculations:    CHA2DS2-VASc Score = 6   This indicates a 9.7% annual risk of stroke. The patient's score is based upon: CHF History: 1 HTN History: 1 Diabetes History: 1 Stroke History: 2 Vascular Disease History: 1 Age Score: 0 Gender Score: 0            Physical Exam:   VS:  BP 90/60 (BP Location: Left Arm, Patient Position: Sitting, Cuff Size: Large)   Pulse 70   Ht 5' 11 (1.803 m)   Wt 287 lb (130.2 kg)   BMI 40.03 kg/m    Wt Readings from Last 3 Encounters:  05/04/24 287 lb (130.2 kg)  04/21/24 (!) 305 lb (138.3 kg)  04/13/24 295 lb 12.8 oz (134.2 kg)    GEN: Well nourished, well developed in no acute distress NECK: + JVD (chronic); No carotid bruits CARDIAC: RRR, no murmurs, rubs, gallops RESPIRATORY:  Clear to auscultation without rales, wheezing or rhonchi  ABDOMEN: Soft, non-tender, non-distended EXTREMITIES:  mild edema; No deformity   ASSESSMENT AND PLAN: .    Chronic HFpEF With return of sinus rhythm he is had significantly improved volume status, seems to be overall euvolemic and DOE has essentially resolved but still very deconditioned.  But he continues to work to improve his functional capacity Continue torsemide 20 mg twice daily  Previously was taking metolazone 5 mg twice a week.  Reports intermittent cramping still.  Changing this to as needed up to twice a week.  Echocardiogram pending 08/11.  Of note he does have history of severe LVH, atrial fibrillation on his prior echocardiogram, no documentation of infiltrative disease however need to consider.     Hypotension Today is 90/60 but for some reason he continued his hydralazine  100 mg twice daily. Reminded him to stop hydralazine . Continue Coreg  25 mg twice daily, if he needs more BP allowance then would halve this. Of note we have already discontinued amlodipine , doxazosin  as well (for some reason this is still been on his med list, will correct today). He will go home and check if he's taking his amlodipine  by mistake as well.     Persistent atrial fibrillation Status post successful DCCV 7/15.  Today EKG shows he is maintaining sinus rhythm.  Feels significantly better. Continue with Eliquis  5 mg twice daily, carvedilol  25 mg twice daily.  Very  diligent about taking his DOAC and never misses.   CAD status post CABG x 1 Hyperlipidemia Does not demonstrate any anginal symptoms.  Seems to be stable.  LDL well-controlled.  Goal less than 55.  Currently 52, April 2025. No aspirin  with Eliquis .  He can continue rosuvastatin  20 mg (CrCl>29ml/min), if we need more LDL controlled and would likely need to switch to high atorvastatin  80 mg.   AVR Stable aortic valve function on echocardiogram May 2024.  Mild aortic valve stenosis noted.  Mean gradient 11.  SBE ppx in setting of his renal insufficiency. They have recommended doxycycline  100mg  30-60 minutes before dental work.  Echocardiogram is pending and plan for 05/18/2024   Diabetes BMI 40 Followed by endocrinology, A1c uncontrolled 8.8%. Needs to follow-up with endocrinology, tentative plans for Mounjaro .  I think this will be very beneficial for him.   CKD Status post renal transplant Followed by nephrology at [facility], needs to follow-up with nephrology On Cellcept , Belatacept , and Prednisone .  Chronic UTI Persistent issue due to Klebsiella pneumoniae.  Would not be candidate for SGLT2 inhibitor.   OSA on BiPAP Compliant.  Dispo: Follow-up in 2 months with Hao (but really this is me) to ensure BP has improved and we have stable diuretic regiment and that he is maintaining sinus.  Signed, Thom LITTIE Sluder, PA-C

## 2024-05-04 NOTE — Patient Instructions (Signed)
 Medication Instructions:  Take the Metolazone 5mg  as needed only.   *If you need a refill on your cardiac medications before your next appointment, please call your pharmacy*   Lab Work: No labs were ordered during today's visit.  If you have labs (blood work) drawn today and your tests are completely normal, you will receive your results only by: MyChart Message (if you have MyChart) OR A paper copy in the mail If you have any lab test that is abnormal or we need to change your treatment, we will call you to review the results.   Testing/Procedures: No procedures were ordered during today's visit.    Follow-Up: At [facility], you and your health needs are our priority.  As part of our continuing mission to provide you with exceptional heart care, we have created designated Provider Care Teams.  These Care Teams include your primary Cardiologist (physician) and Advanced Practice Providers (APPs -  Physician Assistants and Nurse Practitioners) who all work together to provide you with the care you need, when you need it.  We recommend signing up for the patient portal called MyChart.  Sign up information is provided on this After Visit Summary.  MyChart is used to connect with patients for Virtual Visits (Telemedicine).  Patients are able to view lab/test results, encounter notes, upcoming appointments, etc.  Non-urgent messages can be sent to your provider as well.   To learn more about what you can do with MyChart, go to [email or web address].    Your next appointment:  Sept 23, 2025   Other Instructions Thank you for choosing [facility]!

## 2024-05-06 ENCOUNTER — Other Ambulatory Visit

## 2024-05-07 ENCOUNTER — Ambulatory Visit: Payer: Self-pay | Admitting: Endocrinology

## 2024-05-10 LAB — T4, FREE: Free T4: 1.3 ng/dL (ref 0.8–1.8)

## 2024-05-10 LAB — TESTOSTERONE, TOTAL, LC/MS/MS: Testosterone, Total, LC-MS-MS: 297 ng/dL (ref 250–1100)

## 2024-05-10 LAB — HEMOGLOBIN A1C
Hgb A1c MFr Bld: 8.9 % — ABNORMAL HIGH (ref <5.7)
Mean Plasma Glucose: 209 mg/dL
eAG (mmol/L): 11.6 mmol/L

## 2024-05-10 LAB — BASIC METABOLIC PANEL WITHOUT GFR
BUN/Creatinine Ratio: 19 (calc) (ref 6–22)
BUN: 54 mg/dL — ABNORMAL HIGH (ref 7–25)
CO2: 28 mmol/L (ref 20–32)
Calcium: 10.1 mg/dL (ref 8.6–10.3)
Chloride: 96 mmol/L — ABNORMAL LOW (ref 98–110)
Creat: 2.81 mg/dL — ABNORMAL HIGH (ref 0.70–1.35)
Glucose, Bld: 215 mg/dL — ABNORMAL HIGH (ref 65–99)
Potassium: 3.5 mmol/L (ref 3.5–5.3)
Sodium: 137 mmol/L (ref 135–146)

## 2024-05-10 LAB — TSH: TSH: 0.86 m[IU]/L (ref 0.40–4.50)

## 2024-05-13 ENCOUNTER — Ambulatory Visit: Payer: Self-pay | Admitting: Endocrinology

## 2024-05-13 ENCOUNTER — Encounter: Payer: Self-pay | Admitting: Endocrinology

## 2024-05-13 ENCOUNTER — Encounter (HOSPITAL_COMMUNITY): Payer: Self-pay

## 2024-05-13 ENCOUNTER — Ambulatory Visit (INDEPENDENT_AMBULATORY_CARE_PROVIDER_SITE_OTHER): Admitting: Endocrinology

## 2024-05-13 VITALS — BP 122/60 | HR 79 | Resp 20 | Ht 71.0 in

## 2024-05-13 DIAGNOSIS — E291 Testicular hypofunction: Secondary | ICD-10-CM

## 2024-05-13 DIAGNOSIS — E1165 Type 2 diabetes mellitus with hyperglycemia: Secondary | ICD-10-CM | POA: Diagnosis not present

## 2024-05-13 DIAGNOSIS — E039 Hypothyroidism, unspecified: Secondary | ICD-10-CM

## 2024-05-13 DIAGNOSIS — Z794 Long term (current) use of insulin: Secondary | ICD-10-CM | POA: Diagnosis not present

## 2024-05-13 DIAGNOSIS — E782 Mixed hyperlipidemia: Secondary | ICD-10-CM

## 2024-05-13 LAB — GLUCOSE, POCT (MANUAL RESULT ENTRY): POC Glucose: 314 mg/dL — AB (ref 70–99)

## 2024-05-13 MED ORDER — OMNIPOD 5 DEXG7G6 PODS GEN 5 MISC: 1.0000 | 3 refills | Status: AC

## 2024-05-13 MED ORDER — OMNIPOD 5 DEXG7G6 INTRO GEN 5 KIT: 1.0000 | PACK | 0 refills | Status: AC | PRN

## 2024-05-13 NOTE — Patient Instructions (Signed)
 Start Mounjaro  2.5 mg weekly for 4 weeks and increase to 5 mg weekly.  Sent prescription for Omnipod 5, check with pharmacy. If able to get, call to set up diabetes educator visit for training and transition to new pump.

## 2024-05-13 NOTE — Progress Notes (Signed)
 Outpatient Endocrinology Note Robert Brogan England, MD  05/13/24  Patient's Name: Robert [name]    DOB: [birth year]    MRN: [id number]                                                    REASON OF VISIT: Follow up for type 2 diabetes mellitus  PCP: [name], Charlie ORN, MD  HISTORY OF PRESENT ILLNESS:   [name] is a 63 y.o. old male with past medical history listed below, is here for follow up of type 2 diabetes mellitus /hypothyroidism / hypogonadism.   Pertinent Diabetes History: Patient was diagnosed with type 2 diabetes mellitus in 2012.  He was initially treated with oral hypoglycemic drugs like glyburide but could not take metformin because of renal dysfunction.  He had been on Victoza  and later started on insulin  Lantus  and then U-500 insulin .  Later started on insulin  pump, OmniPod with U-500 insulin .  Chronic Diabetes Complications : Retinopathy: no. Last ophthalmology exam was done on annually reportedly. Nephropathy: yes, CKD IV, ESRD s/p renal transplant in 2018.  Peripheral neuropathy: yes, on gabapentin  , following with podiatry regularly. Coronary artery disease: no Stroke: no  Relevant comorbidities and cardiovascular risk factors: Obesity: yes Body mass index is 40.03 kg/m.  Hypertension: yes Hyperlipidemia. Yes, on statin.   Current / Home Diabetic regimen includes:  OmniPod Dash with U-500 insulin   Insulin  Pump setting: Previous settings. Basal ( total 14.5) MN- 0.15u/hour 6AM- 1.2 11AM- 0.7 70M- 0.3  Bolus CHO Ratio (1unit:CHO) MN- 1:14  Correction/Sensitivity: MN- 1:50  Target: 130   Active insulin  time: 6 hours  Has not been on the OmniPod 5 because of the cost of the Dexcom  Prior diabetic medications: Tried Ozempic  could not tolerate.  CONTINUOUS GLUCOSE MONITORING SYSTEM (CGMS) / INSULIN  PUMP INTERPRETATION:                         Omnipod DASH Pump & Sensor Download (Reviewed and summarized below.)  Date July 24 2/6, 2025, 14  days  Total daily dose 22.9 units, basal 63%, 14.4 units and bolus 37%.  He has been using U-500 insulin .  He has been bolusing for meals 1-3 times a day usually 2 times a day and he has been bolusing manually 1 to 4 units based on blood sugar and meal size.  Glycemic data:    CONTINUOUS GLUCOSE MONITORING SYSTEM (CGMS) INTERPRETATION: At today's visit, we reviewed CGM downloads. The full report is scanned in the media.  Dexcom G7-  Sensor Download [employer] was reviewed and summarized below.) Dates: July 24 to August 6 , 2025, 14 days  Sensor Average: 320 Glucose Management Indicator:11%    Previous:     Interpretation: Significant hyperglycemia postprandially in the range of 350-400 mostly with late afternoon and evening time staying hypoglycemia overnight.  Blood sugar trending down by the morning, however no hypoglycemia.  Hyperglycemia related to not enough meal bolus or more meal bolus via insulin  pump, for high carb meals.  Hypoglycemia: Patient has no hypoglycemic episodes. Patient has hypoglycemia awareness.    Factors modifying glucose control: 1.  Diabetic diet assessment: 2-3 meals/ day, snacks at times.   2.  Staying active or exercising: no  3.  Medication compliance: compliant most of the time.  # Hypothyroidism :  Longstanding hypothyroidism has been on thyroid  hormone replacement levothyroxine  every morning, taking 112 mcg daily.  # Hypogonadism : He has hypogonadotropic hypogonadism related to his ? metabolic syndrome.  When testosterone  level was low he was having fatigue and decreased motivation and was prescribed with testosterone  supplement.  He was initially on AndroGel  since February 2019 which was subsequently stopped due to cost.  He is currently on Depo-Testosterone  cypionate 120 mg weekly since February 2023.  He does feel better when taking testosterone  injections.  Pretreatment free testosterone  level was low at 3.7.  Interval history   Insulin  pump and CGM download data reviewed and noted as above.  He is having significant hyperglycemia lately.  GMI on CGM 11%.  Patient hemoglobin A1c 8.9%.  Patient lab results reviewed as normal thyroid  function test.  Relatively stable renal function and normal electrolytes.  He has acceptable total testosterone  normal of 297.  Insulin  pump setting reviewed as above.  Patient had a fall in the clinic during the time of measuring body weight, refer to RN note from today for details. Patient denies lightheadedness, dizziness, chest pain, headache or palpitation.  He denies feeling lightheadedness prior to the fall. Reports he transiently felt mildly lightheaded after he fell however resolved by the time I examined him. He reports his both knees became weak and not able to bear his body weight.  He reports he has been doing physical therapy for the weakness of lower extremities however has not been able to get enough strength into his legs.  He did not hit head.  He denies pain in any part of the body.    On examination he has normal vitals including normal blood pressure and heart rate.  He did not have any bruise or abrasion.  No tenderness.  Rest of the physical examination as mentioned below.  He has been using continuous glucose monitor, glucose reading in 300s and in the clinic point-of-care/fingerstick blood sugar was 314.  No hypoglycemia.    He mentions lately he has been feeling depressed however he has no thoughts or plan for harming himself.  He is on duloxetine .  He has been following with primary care provider.  He does not see psychiatrist.  Discussed and recommended to follow-up with primary care provider regarding his depressed mood and adjustment of his medication.  REVIEW OF SYSTEMS As per history of present illness.   PAST MEDICAL HISTORY: Past Medical History:  Diagnosis Date   Acute cystitis without hematuria 11/09/2021   AKI (acute kidney injury) 08/08/2017   Elevated  sCr to 2.5 (baseline ~1.7) noted at OSH - Subsequent transfer to [facility]  - Currently on IVMF in the setting of diarrhea and AKI - sCr at baseline at time of discharge   Anemia in chronic kidney disease 03/30/2015   Anticoagulated on Coumadin  11/09/2021   Aortic valve stenosis    Arthritis    Atrial fibrillation 11/29/2021   Benign essential hypertension 07/30/2014   prior to transplant pt took Metorpolol tartrate 50mg  qd on off HD days   Benign neoplasm of transverse colon    Cancer    skin ca right shoulder, plastic dsyplasia, pre-Ca polpys removed on Colonoscopy- 07/2014   Cerebrovascular accident (CVA) due to embolism of precerebral artery    Chronic renal failure    Colon polyps 07/23/2014   Tubular adenomas x 5   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   Diverticulosis of colon without hemorrhage    Eczema    ESRD (end stage renal disease)    hemodialysis 06/2014-02/2017, s/p living unrelated kidney transplant 02/26/17   Exertional dyspnea    Fatigue 01/08/2014   Fissure of skin 03/08/2020   GI bleed 07/26/2017   Headache    Heart murmur    aortic stenosis (moderate-severe 04/2021)   History of COVID-19 2023   History of unilateral nephrectomy 09/06/2013   Hydrocele, acquired 04/07/2017   Hyperlipidemia 09/06/2013   Hypertensive urgency 10/21/2021   Hyponatremia 11/09/2021   Hypothyroidism    Immunosuppressed status 02/26/2017   Induction agent: Solumedrol - Envarsus  3mg  daily  - Home dose decreased from 5mg  to 3mg  in the setting of FK 15.7 while inpatient   - Previously on Prograf , though d/c'ed during previous admission; suspected this may be causing hallucinations  - Cellcept  1000mg  Q12H  - Prednisone  5mg  daily   Inguinal hernia without obstruction or  gangrene 06/30/2017   Right inguinal wall defect appreciated by CT imaging [place] and on exam Dr. Jaquelyn today Dr. Jaquelyn has discussed hernia repair with mesh at some point in near future  Last Assessment & Plan:  Formatting of this note might be different from the original. Right inguinal wall defect appreciated by CT imaging [place] and on exam Dr. Jaquelyn today Dr. Jaquelyn has discussed hern   Kidney transplanted 02/26/2017   Date of Transplant = 02/26/17  ABO (Donor/Recipient) = compatible Blood type: O+/A+ DonorType: Living donor, unrelated Allograft type:Left kidney Donor anatomy: 2 arteries, 1 veins and single ureter  Pants-to-skirt anastomosis of dual renal arteries on the backtable. Donor Kidney Bx: Not applicable Allograft injury/complications: None Ne   Lower extremity edema 09/06/2013   Macular degeneration    Mild vascular neurocognitive disorder 02/08/2022   OAB (overactive bladder) 12/28/2019   Obstructive sleep apnea 09/06/2013   On Bi-PAP  Last Assessment & Plan:  Formatting of this note might be different from the original. - History of OSA, continue CPAP at night   Peritoneal dialysis catheter in place 02/24/2015   RLQ abdominal pain 04/19/2020   S/P AVR (aortic valve replacement) 10/05/2021   SBO (small bowel obstruction) 05/2015   Scrotal edema 03/14/2017   Testicular ultrasound did show hydrocele  Will refer to urology regarding ongoign swelling, hydrocele.   Last Assessment & Plan:  Formatting of this note might be different from the original.   Testicular ultrasound did show hydrocele  Will refer to urology regarding ongoign swelling, hydrocele.   Shortness of breath    with exertion   Stage 3b chronic kidney disease (CKD) 03/22/2014   SVT (supraventricular tachycardia) 09/06/2013   Tremor of both hands 06/29/2017   Last Assessment & Plan:  Present since transplant, stable and unchanged Query from tacrolimus  -Improved tremors noted and verbalized by pt - Prograf  doses  titrated per FK levels  Last Assessment & Plan:  Formatting of this note might be different from the original. Present since transplant, stable and unchanged Query from tacrolimus  -Improved tremors noted and verbalized by pt - Prograf  doses titr   Type 2 diabetes mellitus with hyperglycemia, with long-term current use of insulin  07/14/2015   Umbilical hernia    s/p repair 04/28/15    PAST SURGICAL HISTORY: Past Surgical History:  Procedure Laterality Date   AORTIC VALVE REPLACEMENT N/A 10/05/2021  Procedure: AORTIC VALVE REPLACEMENT (AVR) WITH INSPIRIS RESILIA AORTIC VALVE SIZE ;  Surgeon: Lucas Dorise POUR, MD;  Location: [facility] OR;  Service: Open Heart Surgery;  Laterality: N/A;   AV FISTULA PLACEMENT Right 02/25/2015   Procedure: RIGHT ARTERIOVENOUS (AV) FISTULA CREATION;  Surgeon: Gaile LELON New, MD;  Location: [facility] OR;  Service: Vascular;  Laterality: Right;   AV FISTULA PLACEMENT Right 09/15/2015   Procedure: ARTERIOVENOUS (AV) FISTULA CREATION- RIGHT ARM;  Surgeon: Gaile LELON New, MD;  Location: [facility] OR;  Service: Vascular;  Laterality: Right;   CAPD INSERTION N/A 05/18/2014   Procedure: LAPAROSCOPIC INSERTION CONTINUOUS AMBULATORY PERITONEAL DIALYSIS  (CAPD) CATHETER;  Surgeon: Lynda Leos, MD;  Location: [facility] OR;  Service: General;  Laterality: N/A;   CARDIOVERSION N/A 04/21/2024   Procedure: CARDIOVERSION;  Surgeon: Delford Maude BROCKS, MD;  Location: [facility] INV[facility];  Service: Cardiovascular;  Laterality: N/A;   COLONOSCOPY     COLONOSCOPY WITH PROPOFOL  N/A 07/12/2016   Procedure: COLONOSCOPY WITH PROPOFOL ;  Surgeon: Toribio SHAUNNA Cedar, MD;  Location: [facility]COPY;  Service: Endoscopy;  Laterality: N/A;   CORONARY ARTERY BYPASS GRAFT N/A 10/05/2021   Procedure: CORONARY ARTERY BYPASS GRAFTING (CABG) X 1, ON PUMP, USING LEFT INTERNAL MAMMARY ARTERY CONDUIT;  Surgeon: Lucas Dorise POUR, MD;  Location: [facility] OR;  Service: Open Heart Surgery;  Laterality: N/A;   declotting of fistula  08/2015    ESOPHAGOGASTRODUODENOSCOPY (EGD) WITH PROPOFOL  N/A 07/12/2016   Procedure: ESOPHAGOGASTRODUODENOSCOPY (EGD) WITH PROPOFOL ;  Surgeon: Toribio SHAUNNA Cedar, MD;  Location: [facility];  Service: Endoscopy;  Laterality: N/A;   FISTULA SUPERFICIALIZATION Right 02/07/2016   Procedure: RIIGHT UPPER ARM FISTULA SUPERFICIALIZATION;  Surgeon: Carlin FORBES Haddock, MD;  Location: [facility] OR;  Service: Vascular;  Laterality: Right;   IJ catheter insertion     INSERTION OF DIALYSIS CATHETER Right 02/25/2015   Procedure: INSERTION OF RIGHT INTERNAL JUGULAR DIALYSIS CATHETER;  Surgeon: Gaile LELON New, MD;  Location: [facility] OR;  Service: Vascular;  Laterality: Right;   KIDNEY TRANSPLANT     LAPAROSCOPIC REPOSITIONING CAPD CATHETER N/A 06/16/2014   Procedure: LAPAROSCOPIC REPOSITIONING CAPD CATHETER;  Surgeon: Lynda Leos, MD;  Location: [facility] OR;  Service: General;  Laterality: N/A;   LAPAROSCOPY N/A 05/04/2015   Procedure: LAPAROSCOPY DIAGNOSTIC LYSIS OF ADHESIONS;  Surgeon: Vicenta Poli, MD;  Location: [facility] OR;  Service: General;  Laterality: N/A;   MINOR REMOVAL OF PERITONEAL DIALYSIS CATHETER N/A 04/28/2015   Procedure:  REMOVAL OF PERITONEAL DIALYSIS CATHETER;  Surgeon: Vicenta Poli, MD;  Location: [facility] OR;  Service: General;  Laterality: N/A;   NEPHRECTOMY Left 1974   RENAL BIOPSY Right 2012   REVISON OF ARTERIOVENOUS FISTULA Right 05/26/2015   Procedure: SUPERFICIALIZATION OF ARTERIOVENOUS FISTULA WITH SIDE BRANCH LIGATIONS;  Surgeon: Gaile LELON New, MD;  Location: [facility];  Service: Vascular;  Laterality: Right;   RIGHT/LEFT HEART CATH AND CORONARY ANGIOGRAPHY N/A 08/22/2021   Procedure: RIGHT/LEFT HEART CATH AND CORONARY ANGIOGRAPHY;  Surgeon: Burnard Debby LABOR, MD;  [facility];  Service: Cardiovascular;  Laterality: N/A;   SKIN CANCER EXCISION     right shoulder   SVT ABLATION N/A 11/23/2016   Procedure: SVT Ablation;  Surgeon: Will Gladis Norton, MD;  [facility];  Service:  Cardiovascular;  Laterality: N/A;   TEE WITHOUT CARDIOVERSION N/A 10/05/2021   Procedure: TRANSESOPHAGEAL ECHOCARDIOGRAM (TEE);  Surgeon: Lucas Dorise POUR, MD;  Location: [facility] OR;  Service: Open Heart Surgery;  Laterality: N/A;   UMBILICAL HERNIA REPAIR N/A  05/18/2014   Procedure: HERNIA REPAIR UMBILICAL ADULT;  Surgeon: Lynda Leos, MD;  Location: [facility] OR;  Service: General;  Laterality: N/A;   UMBILICAL HERNIA REPAIR N/A 04/28/2015   Procedure: UMBILICAL HERNIA REPAIR WITH MESH;  Surgeon: Vicenta Poli, MD;  Location: [facility] OR;  Service: General;  Laterality: N/A;    ALLERGIES: No Known Allergies  FAMILY HISTORY:  Family History  Problem Relation Age of Onset   Colon polyps Father    Stroke Father    Dementia Father        vascular   Colon cancer Neg Hx    Stomach cancer Neg Hx     SOCIAL HISTORY: Social History   Socioeconomic History   Marital status: Married    Spouse name: Not on file   Number of children: 3   Years of education: 16   Highest education level: Bachelor's degree (e.g., BA, AB, BS)  Occupational History   Occupation: English as a second language teacher: [employer]  Tobacco Use   Smoking status: Former    Types: Cigars    Quit date: 12/06/2012    Years since quitting: 11.4   Smokeless tobacco: Never  Vaping Use   Vaping status: Never Used  Substance and Sexual Activity   Alcohol  use: Not Currently   Drug use: No   Sexual activity: Not on file  Other Topics Concern   Not on file  Social History Narrative   Left handed    Lives with wife   Social Drivers of Health   Financial Resource Strain: Low Risk  (09/18/2023)   Received from [facility]   Overall Financial Resource Strain (CARDIA)    Difficulty of Paying Living Expenses: Not hard at all  Food Insecurity: No Food Insecurity (09/18/2023)   Received from [facility]   Hunger Vital Sign    Within the past 12 months, you worried that your food would run out before you got the  money to buy more.: Never true    Within the past 12 months, the food you bought just didn't last and you didn't have money to get more.: Never true  Transportation Needs: No Transportation Needs (09/18/2023)   Received from [facility] - Transportation    In the past 12 months, has lack of transportation kept you from medical appointments or from getting medications?: No    Lack of Transportation (Non-Medical): No  Physical Activity: Not on file  Stress: Not on file  Social Connections: Unknown (02/17/2022)   Received from [facility]   Social Network    Social Network: Not on file    MEDICATIONS:  Current Outpatient Medications  Medication Sig Dispense Refill   acetaminophen  (TYLENOL ) 325 MG tablet Take 2 tablets (650 mg total) by mouth every 4 (four) hours as needed for moderate pain.     albuterol  (VENTOLIN  HFA) 108 (90 Base) MCG/ACT inhaler Inhale 1 puff into the lungs every 6 (six) hours as needed for wheezing or shortness of breath.     amLODipine  (NORVASC ) 5 MG tablet Take 7.5 mg by mouth daily.     apixaban  (ELIQUIS ) 5 MG TABS tablet TAKE 1 TABLET BY MOUTH TWICE A DAY 180 tablet 1   B-D UF III MINI PEN NEEDLES 31G X 5 MM MISC USE TO INJECT INSULIN  4 TIMES A DAY 360 each 2   belatacept  (NULOJIX ) 250 MG SOLR injection Inject 24.5 mLs into the vein every 28 (twenty-eight) days.  carvedilol  (COREG ) 25 MG tablet Take 1 tablet (25 mg total) by mouth 2 (two) times daily with a meal.     Continuous Glucose Sensor (DEXCOM G7 SENSOR) MISC Use to check glucose continuously, change sensor every 10 days 9 each 3   Cranberry-Vitamin C-Probiotic (AZO CRANBERRY PO) Take 1 capsule by mouth daily.     D-MANNOSE PO Take 1 tablet by mouth daily.     DULoxetine  (CYMBALTA ) 30 MG capsule Take 30 mg by mouth daily.     gabapentin  (NEURONTIN ) 300 MG capsule Take 300 mg by mouth at bedtime.     insulin  aspart (NOVOLOG  FLEXPEN) 100 UNIT/ML FlexPen Inject 5-10 Units into  the skin as needed for high blood sugar. For hyperglycemia despite bolus via pump. 15 mL 11   Insulin  Disposable Pump (OMNIPOD 5 DEXG7G6 INTRO GEN 5) KIT 1 each by Does not apply route as needed. 1 kit 0   Insulin  Disposable Pump (OMNIPOD 5 DEXG7G6 PODS GEN 5) MISC 1 each by Does not apply route every 3 (three) days. 30 each 3   Insulin  Disposable Pump (OMNIPOD DASH INTRO, GEN 4,) KIT by Does not apply route.     insulin  regular human CONCENTRATED (HUMULIN  R) 500 UNIT/ML injection Inject 0.25 mLs (125 Units total) into the skin every 3 (three) days. Omnipod 20 mL 3   levothyroxine  (SYNTHROID ) 112 MCG tablet Take 1 tablet (112 mcg total) by mouth daily. 90 tablet 3   methenamine (HIPREX) 1 g tablet Take 1 g by mouth 2 (two) times daily with a meal.     metolazone (ZAROXOLYN) 5 MG tablet Take 5 mg by mouth as needed.     mycophenolate  (CELLCEPT ) 250 MG capsule Take 500 mg by mouth every 12 (twelve) hours.     NEEDLE, DISP, 18 G (BD HYPODERMIC NEEDLE) 18G X 1 MISC USE AS DIRECTED TO INJECT 0.6MLS OF TESTOSTERONE  EVERY 7 DAYS (DRAW UP) 100 each 1   nitroGLYCERIN  (NITROSTAT ) 0.4 MG SL tablet Place 0.4 mg under the tongue every 5 (five) minutes as needed for chest pain.     NON FORMULARY CPAP/BIPAP  at bedtime  BIPAP per pt     predniSONE  (DELTASONE ) 5 MG tablet Take 5 mg by mouth daily with breakfast. Continuously     rosuvastatin  (CRESTOR ) 20 MG tablet Take 1 tablet (20 mg total) by mouth daily. 90 tablet 3   senna-docusate (SENOKOT-S) 8.6-50 MG tablet Take 1 tablet by mouth at bedtime as needed for mild constipation.     spironolactone  (ALDACTONE ) 25 MG tablet Take 0.5 tablets (12.5 mg total) by mouth daily. 45 tablet 3   Syringe, Disposable, 1 ML MISC Use to draw up 0.31ml of testosterone  medication 25 each 0   SYRINGE-NEEDLE, DISP, 3 ML (B-D 3CC LUER-LOK SYR 23GX1-1/2) 23G X 1-1/2 3 ML MISC USE ONCE EVERY WEEK TO INJECT TESTOSTERONE  50 each 1   SYRINGE-NEEDLE, DISP, 3 ML (BD PLASTIPAK SYRINGE)  21G X 1 3 ML MISC USE AS DIRECTED TO INJECT 0.6MLS OF TESTOSTERONE  EVERY 7 DAYS (INJECT) 100 each 1   tamsulosin  (FLOMAX ) 0.4 MG CAPS capsule Take 0.8 mg by mouth daily after supper.     Testosterone  Cypionate 200 MG/ML SOLN Inject 120 mg as directed once a week. Saturday 10 mL 0   tirzepatide  (MOUNJARO ) 2.5 MG/0.5ML Pen Inject 2.5 mg into the skin once a week for 4 weeks and then increase to 5 mg weekly. 2 mL 0   tirzepatide  (MOUNJARO ) 5 MG/0.5ML Pen Inject 5 mg  into the skin once a week. After 4 weeks of 2.5mg  dose. 6 mL 3   torsemide (DEMADEX) 20 MG tablet Take 20 mg by mouth 2 (two) times daily.     traMADol  (ULTRAM ) 50 MG tablet Take 50-100 mg by mouth daily as needed for moderate pain (pain score 4-6).     traZODone  (DESYREL ) 50 MG tablet Take 50 mg by mouth at bedtime as needed for sleep.     zolpidem  (AMBIEN ) 10 MG tablet Take 10 mg by mouth at bedtime as needed for sleep.     zonisamide  (ZONEGRAN ) 25 MG capsule Take 3 capsules (75 mg total) by mouth daily. 270 capsule 1   Continuous Glucose Sensor (FREESTYLE LIBRE 3 PLUS SENSOR) MISC Inject 1 Device into the skin continuous. Change every 15 days (Patient not taking: Reported on 05/13/2024) 6 each 3   Continuous Glucose Sensor (FREESTYLE LIBRE 3 SENSOR) MISC SMARTSIG:1 Topical Every 2 Weeks (Patient not taking: Reported on 05/13/2024) 6 each 2   No current facility-administered medications for this visit.    PHYSICAL EXAM: Vitals:   05/13/24 1402  BP: 122/60  Pulse: 79  Resp: 20  SpO2: 93%  Height: 5' 11 (1.803 m)    Body mass index is 40.03 kg/m.  Wt Readings from Last 3 Encounters:  05/04/24 287 lb (130.2 kg)  04/21/24 (!) 305 lb (138.3 kg)  04/13/24 295 lb 12.8 oz (134.2 kg)    General: Well developed, well nourished male in no apparent distress.  HEENT: AT/[place], no external lesions.  Eyes: Conjunctiva clear and no icterus. Neck: Neck supple  Lungs: Respirations not labored. CTA bilaterally. Neurologic: Alert, oriented,  normal speech.  Extremities / Skin: Dry. No pedal edema Psychiatric: Does not appear depressed or anxious  Diabetic Foot Exam - Simple   No data filed    LABS Reviewed Lab Results  Component Value Date   HGBA1C 8.9 (H) 05/06/2024   HGBA1C 8.8 (H) 01/08/2024   HGBA1C 7.7 (H) 09/27/2023   Lab Results  Component Value Date   FRUCTOSAMINE 280 10/29/2022   FRUCTOSAMINE 295 (H) 11/28/2021   FRUCTOSAMINE 296 (H) 07/04/2021   Lab Results  Component Value Date   CHOL 114 01/08/2024   HDL 45 01/08/2024   LDLCALC 52 01/08/2024   TRIG 85 01/08/2024   CHOLHDL 2.5 01/08/2024   No results found for: [facility]  Lab Results  Component Value Date   CREATININE 2.81 (H) 05/06/2024   Lab Results  Component Value Date   GFR 26.95 (L) 07/01/2023    ASSESSMENT / PLAN  1. Uncontrolled type 2 diabetes mellitus with hyperglycemia, with long-term current use of insulin  (HCC)   2. Hypogonadism male   3. Acquired hypothyroidism   4. Mixed hyperlipidemia     Diabetes Mellitus type 2, complicated by diabetic nephropathy and neuropathy. - Diabetic status / severity: Uncontrolled.  Lab Results  Component Value Date   HGBA1C 8.9 (H) 05/06/2024    - Hemoglobin A1c goal <6.5%   Discussed about compliance with bolusing for all meals.    - Medications:   OmniPod Dash with U-500 insulin   Insulin  Pump setting: No change in pump setting today.  Advised to use bolus setting in the pump for bolusing for meals, advised to increase bolus by 1 to 3 units.  He is currently using manual boluses.  He has not started Mounjaro  yet as planned in last visit.  Start Mounjaro  2.5 mg weekly and increase to 5 mg weekly.  -No contraindication for  GLP-1 receptor agonist, no personal history of pancreatitis.  No family history of medullary cancer/MEN 2 syndrome.  Sent prescription for OmniPod 5.  He is currently using Dexcom G7.  When able to refill OmniPod 5 from the pharmacy,asked to call our  clinic to set of diabetic educator visit for pump training and transition to new pump.  - Home glucose testing: continue CGM /Dexcom G7  and check blood glucose as needed.  - Discussed/ Gave Hypoglycemia treatment plan. # Consult : not required at this time.   # CKD 4, ESRD status post renal transplant in 2018, following with nephrology. Last  No results found for: MICRALBCREAT   # Foot check nightly / neuropathy, continue gabapentin , following with podiatry/neurology.  # Annual dilated diabetic eye exams.   - Diet: Make healthy diabetic food choices  2. Blood pressure  -  BP Readings from Last 1 Encounters:  05/13/24 122/60    - Control is in target.  - No change in current plans.  3. Lipid status / Hyperlipidemia - Last  Lab Results  Component Value Date   LDLCALC 52 01/08/2024   - Continue rosuvastatin  20 mg daily.  # Hypothyroidism : Continue levothyroxine  112 mcg daily.  # Hypogonadism : Continue testosterone  cypionate 120 mg weekly.    Diagnoses and all orders for this visit:  Uncontrolled type 2 diabetes mellitus with hyperglycemia, with long-term current use of insulin  (HCC) -     Insulin  Disposable Pump (OMNIPOD 5 DEXG7G6 PODS GEN 5) MISC; 1 each by Does not apply route every 3 (three) days. -     Insulin  Disposable Pump (OMNIPOD 5 DEXG7G6 INTRO GEN 5) KIT; 1 each by Does not apply route as needed. -     POCT glucose (manual entry)  Hypogonadism male  Acquired hypothyroidism  Mixed hyperlipidemia   DISPOSITION Follow up in clinic in 6 weeks suggested.     All questions answered and patient verbalized understanding of the plan.  Robert Jazariah Teall, MD [facility] [address] [place], [address] [place], [place] [address] Phone # [phone]  At least part of this note was generated using voice recognition software. Inadvertent word errors may have occurred, which were not recognized during the proofreading process.

## 2024-05-13 NOTE — Progress Notes (Signed)
 Patient fell while trying to weigh on scale. Patient originally was walking with 2 canes on each side. Patient placed belongings in chair including his 2 canes along side the chair. Patient reached for scale however missed the handle and fell. Patient advised to lie on the floor for a second in order to regain strength. Patient states he feels fine, denies dizziness or lightheadedness when fall occurred. VS were normal. When asked by RN why he didn't bring his canes with him to the scale, patient stated he didn't want to bring his canes with him to weigh because he wanted to see what he weighed alone due to he was trying to lose weight. With assistance of Dr. Mercie; RN placed arm under patient's right arm with MD on her left side doing the same and assisted patient to standing position. Patient placed in wheelchair and taken to room. After patient's visit, RN offered to roll patient down stairs in wheelchair when he was checking out, however patient stated he was going to sit in lobby for a  little while and then preferred to walk downstairs. RN made patient confirm he felt steady enough to which he stated he did. No further concerns at this time.

## 2024-05-15 ENCOUNTER — Encounter (HOSPITAL_COMMUNITY): Payer: Self-pay

## 2024-05-18 ENCOUNTER — Ambulatory Visit (HOSPITAL_COMMUNITY)
Admission: RE | Admit: 2024-05-18 | Discharge: 2024-05-18 | Disposition: A | Discharge status: Home / Self Care | Payer: Self-pay | Source: Ambulatory Visit | Attending: Cardiology | Admitting: Cardiology

## 2024-05-18 DIAGNOSIS — I5033 Acute on chronic diastolic (congestive) heart failure: Secondary | ICD-10-CM | POA: Diagnosis present

## 2024-05-18 LAB — ECHOCARDIOGRAM COMPLETE
AR max vel: 1.86 cm2
AV Area VTI: 2.3 cm2
AV Area mean vel: 1.99 cm2
AV Mean grad: 13 mmHg
AV Peak grad: 23.2 mmHg
Ao pk vel: 2.41 m/s
Area-P 1/2: 2.34 cm2
S' Lateral: 3.3 cm

## 2024-05-20 ENCOUNTER — Telehealth: Payer: Self-pay | Admitting: Endocrinology

## 2024-05-20 NOTE — Telephone Encounter (Signed)
 Patient is calling to say that he is trying to get his Insulin  Disposable Pump (OMNIPOD 5 DEXG7G6 INTRO GEN 5) KIT From   [id number] - [place], [place] - [address] AT [address] (Ph: [phone])  Per patient the pharmacy says that his out of pocket cost for the pump is $800+ and patient states that he cannot afford that and wants to know what he should do.

## 2024-05-20 NOTE — Telephone Encounter (Signed)
 I had ordered OmniPod 5, if it is expensive, he can stay on OmniPod Dash which he is currently using.

## 2024-05-21 ENCOUNTER — Telehealth: Payer: Self-pay | Admitting: Pharmacy Technician

## 2024-05-21 ENCOUNTER — Encounter (HOSPITAL_COMMUNITY): Payer: Self-pay

## 2024-05-21 ENCOUNTER — Other Ambulatory Visit (HOSPITAL_COMMUNITY): Payer: Self-pay

## 2024-05-21 NOTE — Telephone Encounter (Signed)
 Pharmacy Patient Advocate Encounter   Received notification from CoverMyMeds that prior authorization for Omnipod 5 is required/requested.   Insurance verification completed.   The patient is insured through [employer] .   Per test claim: Refill too soon. PA is not needed at this time. Medication was filled 05/18/24. Next eligible fill date is 07/24/24.

## 2024-05-25 ENCOUNTER — Other Ambulatory Visit: Payer: Self-pay

## 2024-05-25 DIAGNOSIS — E1165 Type 2 diabetes mellitus with hyperglycemia: Secondary | ICD-10-CM

## 2024-06-03 ENCOUNTER — Telehealth: Payer: Self-pay

## 2024-06-03 NOTE — Telephone Encounter (Signed)
 Patient called requesting a PA for Omnipod

## 2024-06-04 ENCOUNTER — Telehealth: Payer: Self-pay

## 2024-06-04 NOTE — Telephone Encounter (Signed)
 Patient called and made aware of the approval letter received about Omnipod 5. Patient stated he would call [facility] to determine his portion of the cost:   Auth number: [id number]

## 2024-06-05 ENCOUNTER — Encounter: Payer: Self-pay | Admitting: Endocrinology

## 2024-06-08 ENCOUNTER — Other Ambulatory Visit: Payer: Self-pay | Admitting: Endocrinology

## 2024-06-08 DIAGNOSIS — E1165 Type 2 diabetes mellitus with hyperglycemia: Secondary | ICD-10-CM

## 2024-06-09 ENCOUNTER — Other Ambulatory Visit: Payer: Self-pay | Admitting: Endocrinology

## 2024-06-09 NOTE — Telephone Encounter (Signed)
 If the OmniPod 5 is not covered, you can stay on OmniPod Dash that you have been currently using.  There are other insulin  pump, probably other pump will also be not covered.

## 2024-06-16 ENCOUNTER — Telehealth: Payer: Self-pay

## 2024-06-16 DIAGNOSIS — E291 Testicular hypofunction: Secondary | ICD-10-CM

## 2024-06-16 MED ORDER — BD LUER-LOK SYRINGE 23G X 1-1/2" 3 ML MISC: 1 refills | Status: AC

## 2024-06-16 NOTE — Telephone Encounter (Signed)
 Patient called and left a VM saying that his syringes were filled but that the pharmacy would not give them to him. Spoke to  pharmacy they stated that they don't deny refills but the syringes patient were requesting were inactive/out of refills because the other gauges had been filled. Sent in refill . Patient made aware via Mychart.

## 2024-06-16 NOTE — Progress Notes (Unsigned)
 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  LDL goal less than 70 Normotensive blood pressure Hgb A1c goal less than 7 Follow up in 6-7 months.    Subjective:  Robert [name] is a 63 year old left-handed male with CAD, DM II, aortic valve stenosis, ESRD s/p renal transplant with CKD3b, hypothyroidism, HTN, sleep apnea and paroxysmal atrial tachycardia and PSVT who follows up for headache and stroke.   UPDATE: Current medications:  Zonisamide  75mg  daily, Eliquis , rosuvastatin , duloxetine  30mg  daily, gabapentin  200mg  AM and 300mg  PM, amlodipine , carvedilol , doxazosin , clonidine, furosemide , HCTZ, hydralazine , duloxetine , prednisone , Cellcept , tramadol   Underwent cardioversion in July.  ***  Has not had a recent headache.  He does occasionally get an ocular migraine without headache, described as seeing a semicircular scotoma with checkered pattern in the right periphery lasting 5 minutes.    Still without persistent dizziness but may have brief episodes with double vision lasting 15-30 minutes, responds to self-vestibular exercises.  No double vision since last time.  No longer with persistent dizziness but   Sleeps well with either trazodone , Ambien  or Benadryl     HISTORY: Following CABG x1 and aortic valve replacement  on 10/05/2021, he noted that he was unable to lift his right leg.  He has history of right lower extremity weakness and pain following renal transplant in 2018 but his leg was now almost plegic.  CT head  negative for acute abnormality.  MRI of brain on 10/11/2021 revealed scattered acute and subacute small infarcts in multiple vascular territories including left cerebellar hemisphere, left MCA territory and bilateral PCA territory.  MRA of head and neck demonstrated chronic occluded V4 segment of the right vertebral artery as well as moderate stenosis of the right ACA A2.  The intra-operative TEE on 12/29 revealed no evidence of thrombus.  He did develop post-operative valvular a fib and was started on warfarin.  To evaluate for possible radiculopathy, he also had MRI of lumbar spine which revealed multilevel degenerative disc and endplate disease with mild spinal and left lateral recess stenosis at L1-2, moderate right neural foraminal stenosis at L3 and bilateral L4 nerve levels and moderate to severe neural foraminal stenosis at the bilateral L5 nerve levels.  He was discharged to SNF.  He was readmitted to the hospital on 10/21/2021 for hypertensive urgency presenting as dizziness and nausea.  Blood pressure was 204/119.  CT head again demonstrated known subacute embolic infarcts but no new acute findings.  He had to be hospitalized again on 11/08/2021 for   pyelonephritis and UTI.    Since the stroke, he had been experiencing headaches and dizziness.  Headaches are severe throbbing pain from left eye radiating to both temples.  Sometimes nausea but no photophobia, phonophobia or visual disturbance.  Occurs every other day.  Treats with tramadol  and Tylenol .  Lasts 30 minutes with treatment, otherwise 1.5 hours.  Takes tramadol  and Tylenol  daily.  Dizziness is persistent but will have episodes of increased severity lasting 15 minutes every 2 days.  He underwent vestibular evaluation by physical therapy.  Oculomotor exam revealed saccades with superior smooth pursuits, undershooting with  saccadic testing, slow and guarded VOR, positive HIT right greater than left, and negative positional testing.    Since the  stroke, he has had episode of vertical double vision for several seconds.  He had worsening symptoms on 11/24/2021 while working with OT/PT.  Still has right leg weakness.  Arm is improved.  He has trouble ambulating.  He was seen in the ED where etiology was thought to be due to hypoglycemia.  CT head and follow up MRI of brain showed no acute findings.  Since the stroke, he has trouble remembering events that had occurred around that time.  Underwent neuropsychological evaluation on 02/08/2022 consistent with primarily mild neurocognitive disorder in conjunction with underlying anxiety, chronic pain (neuropathy) and sleep dysfunction.  When he gets stressed out, he continues to sometimes experience develop double vision.  On 10/15/2022, he developed dizziness, skewed diplopia and right sided numbness.  He was seen in ED where MRI of brain and MRA head and neck personally reviewed revealed no changes.  He was found to have a UTI and treated accordingly.  His blood sugar was low, in the 40s.  Sometimes he still drags his right leg.    Admitted to hospital on 04/30/2022 for UTI when he developed sudden onset of subjective bilateral facial numbness and right arm numbness and weakness.  Ongoing chronic dizziness and headache which is not new.  Other than known right leg weakness, no new objective focal deficits noted.  Stroke code initiated.  CT and subsequent MRI of head showed no acute stroke.  MRA head and neck revealed no LVO or hemodynamically significant stenosis.  Reported 8 mm left ICA terminus outpouching likely artifact due to motion.  2D echo showed EF 60-65%.  LDL 65.  Hgb A1c 7.4.  No change in secondary stroke prevention made in regards to antithrombotic therapy and statin- -advised to continue Eliquis  and Crestor .  To follow up on possible cerebral aneurysm, he had a repeat MRA of head on 06/01/2022 which again demonstrated chronic occlusion of the right vertebral artery beyond the PICA origin but no  aneurysm.    Past medications: nortriptyline  (side effects)  PAST MEDICAL HISTORY: Past Medical History:  Diagnosis Date   Acute cystitis without hematuria 11/09/2021   AKI (acute kidney injury) 08/08/2017   Elevated sCr to 2.5 (baseline ~1.7) noted at OSH - Subsequent transfer to [facility]  - Currently on IVMF in the setting of diarrhea and AKI - sCr at baseline at time of discharge   Anemia in chronic kidney disease 03/30/2015   Anticoagulated on Coumadin  11/09/2021   Aortic valve stenosis    Arthritis    Atrial fibrillation 11/29/2021   Benign essential hypertension 07/30/2014   prior to transplant pt took Metorpolol tartrate 50mg  qd on off HD days   Benign neoplasm of transverse colon    Cancer    skin ca right shoulder, plastic dsyplasia, pre-Ca polpys removed on Colonoscopy- 07/2014   Cerebrovascular accident (CVA) due to embolism of precerebral artery    Chronic renal failure    Colon polyps 07/23/2014   Tubular adenomas x 5   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   Diverticulosis of colon without hemorrhage    Eczema    ESRD (end stage renal disease)  hemodialysis 06/2014-02/2017, s/p living unrelated kidney transplant 02/26/17   Exertional dyspnea    Fatigue 01/08/2014   Fissure of skin 03/08/2020   GI bleed 07/26/2017   Headache    Heart murmur    aortic stenosis (moderate-severe 04/2021)   History of COVID-19 2023   History of unilateral nephrectomy 09/06/2013   Hydrocele, acquired 04/07/2017   Hyperlipidemia 09/06/2013   Hypertensive urgency 10/21/2021   Hyponatremia 11/09/2021   Hypothyroidism    Immunosuppressed status 02/26/2017   Induction agent: Solumedrol - Envarsus  3mg  daily  - Home dose decreased from 5mg  to 3mg  in the setting of  FK 15.7 while inpatient   - Previously on Prograf , though d/c'ed during previous admission; suspected this may be causing hallucinations  - Cellcept  1000mg  Q12H  - Prednisone  5mg  daily   Inguinal hernia without obstruction or gangrene 06/30/2017   Right inguinal wall defect appreciated by CT imaging [place] and on exam Dr. Jaquelyn today Dr. Jaquelyn has discussed hernia repair with mesh at some point in near future  Last Assessment & Plan:  Formatting of this note might be different from the original. Right inguinal wall defect appreciated by CT imaging [place] and on exam Dr. Jaquelyn today Dr. Jaquelyn has discussed hern   Kidney transplanted 02/26/2017   Date of Transplant = 02/26/17  ABO (Donor/Recipient) = compatible Blood type: O+/A+ DonorType: Living donor, unrelated Allograft type:Left kidney Donor anatomy: 2 arteries, 1 veins and single ureter  Pants-to-skirt anastomosis of dual renal arteries on the backtable. Donor Kidney Bx: Not applicable Allograft injury/complications: None Ne   Lower extremity edema 09/06/2013   Macular degeneration    Mild vascular neurocognitive disorder 02/08/2022   OAB (overactive bladder) 12/28/2019   Obstructive sleep apnea 09/06/2013   On Bi-PAP  Last Assessment & Plan:  Formatting of this note might be different from the original. - History of OSA, continue CPAP at night   Peritoneal dialysis catheter in place 02/24/2015   RLQ abdominal pain 04/19/2020   S/P AVR (aortic valve replacement) 10/05/2021   SBO (small bowel obstruction) 05/2015   Scrotal edema 03/14/2017   Testicular ultrasound did show hydrocele  Will refer to urology regarding ongoign swelling, hydrocele.   Last Assessment & Plan:  Formatting of this note might be different from the original.   Testicular ultrasound did show hydrocele  Will refer to urology regarding ongoign swelling, hydrocele.   Shortness of breath    with exertion   Stage 3b chronic kidney disease (CKD) 03/22/2014   SVT  (supraventricular tachycardia) 09/06/2013   Tremor of both hands 06/29/2017   Last Assessment & Plan:  Present since transplant, stable and unchanged Query from tacrolimus  -Improved tremors noted and verbalized by pt - Prograf  doses titrated per FK levels  Last Assessment & Plan:  Formatting of this note might be different from the original. Present since transplant, stable and unchanged Query from tacrolimus  -Improved tremors noted and verbalized by pt - Prograf  doses titr   Type 2 diabetes mellitus with hyperglycemia, with long-term current use of insulin  07/14/2015   Umbilical hernia    s/p repair 04/28/15    MEDICATIONS: Current Outpatient Medications on File Prior to Visit  Medication Sig Dispense Refill   acetaminophen  (TYLENOL ) 325 MG tablet Take 2 tablets (650 mg total) by mouth every 4 (four) hours as needed for moderate pain.     albuterol  (VENTOLIN  HFA) 108 (90 Base) MCG/ACT inhaler Inhale 1 puff into the lungs every 6 (six) hours as needed  for wheezing or shortness of breath.     amLODipine  (NORVASC ) 5 MG tablet Take 7.5 mg by mouth daily.     apixaban  (ELIQUIS ) 5 MG TABS tablet TAKE 1 TABLET BY MOUTH TWICE A DAY 180 tablet 1   B-D UF III MINI PEN NEEDLES 31G X 5 MM MISC USE TO INJECT INSULIN  4 TIMES A DAY 360 each 2   belatacept  (NULOJIX ) 250 MG SOLR injection Inject 24.5 mLs into the vein every 28 (twenty-eight) days.      carvedilol  (COREG ) 25 MG tablet Take 1 tablet (25 mg total) by mouth 2 (two) times daily with a meal.     Continuous Glucose Sensor (DEXCOM G7 SENSOR) MISC Use to check glucose continuously, change sensor every 10 days 9 each 3   Continuous Glucose Sensor (FREESTYLE LIBRE 3 PLUS SENSOR) MISC Inject 1 Device into the skin continuous. Change every 15 days (Patient not taking: Reported on 05/13/2024) 6 each 3   Continuous Glucose Sensor (FREESTYLE LIBRE 3 SENSOR) MISC SMARTSIG:1 Topical Every 2 Weeks (Patient not taking: Reported on 05/13/2024) 6 each 2    Cranberry-Vitamin C-Probiotic (AZO CRANBERRY PO) Take 1 capsule by mouth daily.     D-MANNOSE PO Take 1 tablet by mouth daily.     DULoxetine  (CYMBALTA ) 30 MG capsule Take 30 mg by mouth daily.     gabapentin  (NEURONTIN ) 300 MG capsule Take 300 mg by mouth at bedtime.     insulin  aspart (NOVOLOG  FLEXPEN) 100 UNIT/ML FlexPen Inject 5-10 Units into the skin as needed for high blood sugar. For hyperglycemia despite bolus via pump. 15 mL 11   Insulin  Disposable Pump (OMNIPOD 5 DEXG7G6 INTRO GEN 5) KIT 1 each by Does not apply route as needed. 1 kit 0   Insulin  Disposable Pump (OMNIPOD 5 DEXG7G6 PODS GEN 5) MISC 1 each by Does not apply route every 3 (three) days. 30 each 3   Insulin  Disposable Pump (OMNIPOD DASH INTRO, GEN 4,) KIT by Does not apply route.     insulin  regular human CONCENTRATED (HUMULIN  R) 500 UNIT/ML injection Inject 0.25 mLs (125 Units total) into the skin every 3 (three) days. Omnipod 20 mL 3   levothyroxine  (SYNTHROID ) 112 MCG tablet Take 1 tablet (112 mcg total) by mouth daily. 90 tablet 3   methenamine (HIPREX) 1 g tablet Take 1 g by mouth 2 (two) times daily with a meal.     metolazone (ZAROXOLYN) 5 MG tablet Take 5 mg by mouth as needed.     mycophenolate  (CELLCEPT ) 250 MG capsule Take 500 mg by mouth every 12 (twelve) hours.     NEEDLE, DISP, 18 G (BD HYPODERMIC NEEDLE) 18G X 1 MISC USE AS DIRECTED TO INJECT 0.6MLS OF TESTOSTERONE  EVERY 7 DAYS (DRAW UP) 100 each 1   nitroGLYCERIN  (NITROSTAT ) 0.4 MG SL tablet Place 0.4 mg under the tongue every 5 (five) minutes as needed for chest pain.     NON FORMULARY CPAP/BIPAP  at bedtime  BIPAP per pt     predniSONE  (DELTASONE ) 5 MG tablet Take 5 mg by mouth daily with breakfast. Continuously     rosuvastatin  (CRESTOR ) 20 MG tablet Take 1 tablet (20 mg total) by mouth daily. 90 tablet 3   senna-docusate (SENOKOT-S) 8.6-50 MG tablet Take 1 tablet by mouth at bedtime as needed for mild constipation.     spironolactone  (ALDACTONE ) 25 MG  tablet Take 0.5 tablets (12.5 mg total) by mouth daily. 45 tablet 3   Syringe, Disposable, 1 ML MISC  Use to draw up 0.31ml of testosterone  medication 25 each 0   SYRINGE-NEEDLE, DISP, 3 ML (B-D 3CC LUER-LOK SYR 23GX1-1/2) 23G X 1-1/2 3 ML MISC USE ONCE EVERY WEEK TO INJECT TESTOSTERONE  50 each 1   SYRINGE-NEEDLE, DISP, 3 ML (BD PLASTIPAK SYRINGE) 21G X 1 3 ML MISC USE AS DIRECTED TO INJECT 0.6MLS OF TESTOSTERONE  EVERY 7 DAYS (INJECT) 100 each 1   tamsulosin  (FLOMAX ) 0.4 MG CAPS capsule Take 0.8 mg by mouth daily after supper.     Testosterone  Cypionate 200 MG/ML SOLN Inject 120 mg as directed once a week. Saturday 10 mL 0   tirzepatide  (MOUNJARO ) 2.5 MG/0.5ML Pen Inject 2.5 mg into the skin once a week for 4 weeks and then increase to 5 mg weekly. 2 mL 0   tirzepatide  (MOUNJARO ) 5 MG/0.5ML Pen Inject 5 mg into the skin once a week. After 4 weeks of 2.5mg  dose. 6 mL 3   torsemide (DEMADEX) 20 MG tablet Take 20 mg by mouth 2 (two) times daily.     traMADol  (ULTRAM ) 50 MG tablet Take 50-100 mg by mouth daily as needed for moderate pain (pain score 4-6).     traZODone  (DESYREL ) 50 MG tablet Take 50 mg by mouth at bedtime as needed for sleep.     zolpidem  (AMBIEN ) 10 MG tablet Take 10 mg by mouth at bedtime as needed for sleep.     zonisamide  (ZONEGRAN ) 25 MG capsule Take 3 capsules (75 mg total) by mouth daily. 270 capsule 1   No current facility-administered medications on file prior to visit.    ALLERGIES: No Known Allergies  FAMILY HISTORY: Family History  Problem Relation Age of Onset   Colon polyps Father    Stroke Father    Dementia Father        vascular   Colon cancer Neg Hx    Stomach cancer Neg Hx       Objective:  *** General: No acute distress.  Patient appears well-groomed.   Head:  Normocephalic/atraumatic Neck:  Supple.  No paraspinal tenderness.  Full range of motion. Heart:  Regular rate and rhythm. Neuro:  Alert and oriented.  Speech fluent and not dysarthric.   Language intact.  CN II-XII intact.  Bulk and tone normal.  Muscle strength 5/5 throughout.  Sensation to light touch intact.  Deep tendon reflexes 2+ throughout, toes downgoing.  Gait normal.  Romberg negative.    Juliene Dunnings, DO  CC: Richard [name], MD

## 2024-06-17 ENCOUNTER — Ambulatory Visit (INDEPENDENT_AMBULATORY_CARE_PROVIDER_SITE_OTHER): Admitting: Neurology

## 2024-06-17 ENCOUNTER — Other Ambulatory Visit

## 2024-06-17 ENCOUNTER — Encounter (HOSPITAL_COMMUNITY): Payer: Self-pay

## 2024-06-17 ENCOUNTER — Encounter: Payer: Self-pay | Admitting: Neurology

## 2024-06-17 VITALS — BP 144/79 | HR 79 | Resp 18 | Ht 71.0 in | Wt 290.0 lb

## 2024-06-17 DIAGNOSIS — G43109 Migraine with aura, not intractable, without status migrainosus: Secondary | ICD-10-CM

## 2024-06-17 DIAGNOSIS — I999 Unspecified disorder of circulatory system: Secondary | ICD-10-CM | POA: Diagnosis not present

## 2024-06-17 DIAGNOSIS — R519 Headache, unspecified: Secondary | ICD-10-CM

## 2024-06-17 DIAGNOSIS — H532 Diplopia: Secondary | ICD-10-CM | POA: Diagnosis not present

## 2024-06-17 DIAGNOSIS — I6310 Cerebral infarction due to embolism of unspecified precerebral artery: Secondary | ICD-10-CM

## 2024-06-17 DIAGNOSIS — F0670 Mild neurocognitive disorder due to known physiological condition without behavioral disturbance: Secondary | ICD-10-CM

## 2024-06-17 NOTE — Patient Instructions (Addendum)
 Continue zonisamide  75mg  daily May use Tylenol  or Tramadol  for headache attacks but limit to no more than 9 days out of the month Check Myasthenia gravis panel with MuSK reflex. Your provider has requested that you have labwork completed today. Please go to [facility] ([id number]) on the second floor of this building before leaving the office today. You do not need to check in. If you are not called within 15 minutes please check with the front desk.   Follow up 6 months.

## 2024-06-18 ENCOUNTER — Telehealth: Payer: Self-pay | Admitting: Dietician

## 2024-06-18 NOTE — Telephone Encounter (Signed)
 Received a call from [facility] pharmacy about his omnipod 5 pump.  He is a current Omnipod DASH user.  They state they need to speak to him to have his pump delivered. Called patient and let him know this information as well as the phone number for [employer].  [phone]  Leita Constable, RD, LDN, CDCES, DipACLM

## 2024-06-19 ENCOUNTER — Encounter: Payer: Self-pay | Admitting: Endocrinology

## 2024-06-19 ENCOUNTER — Encounter (HOSPITAL_COMMUNITY): Payer: Self-pay

## 2024-06-19 ENCOUNTER — Ambulatory Visit (INDEPENDENT_AMBULATORY_CARE_PROVIDER_SITE_OTHER): Admitting: Endocrinology

## 2024-06-19 VITALS — BP 108/80 | HR 68 | Resp 20 | Ht 71.0 in | Wt 291.2 lb

## 2024-06-19 DIAGNOSIS — E039 Hypothyroidism, unspecified: Secondary | ICD-10-CM | POA: Diagnosis not present

## 2024-06-19 DIAGNOSIS — E1165 Type 2 diabetes mellitus with hyperglycemia: Secondary | ICD-10-CM

## 2024-06-19 DIAGNOSIS — E291 Testicular hypofunction: Secondary | ICD-10-CM | POA: Diagnosis not present

## 2024-06-19 DIAGNOSIS — Z794 Long term (current) use of insulin: Secondary | ICD-10-CM | POA: Diagnosis not present

## 2024-06-19 NOTE — Progress Notes (Signed)
 Outpatient Endocrinology Note [place] Eriona Kinchen, MD  06/19/24  Patient's Name: Robert [name]    DOB: [birth year]    MRN: [id number]                                                    REASON OF VISIT: Follow up for type 2 diabetes mellitus  PCP: [name], Charlie ORN, MD  HISTORY OF PRESENT ILLNESS:   Robert [name] is a 63 y.o. old male with past medical history listed below, is here for follow up of type 2 diabetes mellitus / hypothyroidism / hypogonadism.   Pertinent Diabetes History: Patient was diagnosed with type 2 diabetes mellitus in 2012.  He was initially treated with oral hypoglycemic drugs like glyburide but could not take metformin because of renal dysfunction.  He had been on Victoza  and later started on insulin  Lantus  and then U-500 insulin .  Later started on insulin  pump, OmniPod DASH with U-500 insulin .  Chronic Diabetes Complications : Retinopathy: no. Last ophthalmology exam was done on annually reportedly. Nephropathy: yes, CKD IV, ESRD s/p renal transplant in 2018.  Peripheral neuropathy: yes, on gabapentin  , following with podiatry regularly. Coronary artery disease: no Stroke: no  Relevant comorbidities and cardiovascular risk factors: Obesity: yes Body mass index is 40.61 kg/m.  Hypertension: yes Hyperlipidemia. Yes, on statin.   Current / Home Diabetic regimen includes:  OmniPod Dash with U-500 insulin   Insulin  Pump setting: Previous settings. Basal ( total 14.5) MN- 0.1 u/hour 6AM- 1.25 11AM- 0.8 62M- 0.4  Bolus CHO Ratio (1unit:CHO) MN- 1:12  Correction/Sensitivity: MN- 1:50  Target: 130   Active insulin  time: 6 hours  Has not been on the OmniPod 5 because of the cost of the Dexcom  Prior diabetic medications: Tried Ozempic  could not tolerate.  CONTINUOUS GLUCOSE MONITORING SYSTEM (CGMS) / INSULIN  PUMP INTERPRETATION:                         Omnipod DASH Pump & Sensor Download (Reviewed and summarized below.)  Date August 30 to  June 19, 2024, 14 days  Total daily dose 17.8 units, basal 75%, 13.4 units and bolus 25%.  He has been using U-500 insulin .  He has been bolusing for meals 1-3 times a day usually 2 times a day and he has been bolusing manually 1 to 4 units based on blood sugar and meal size.  Glycemic data:    CONTINUOUS GLUCOSE MONITORING SYSTEM (CGMS) INTERPRETATION: At today's visit, we reviewed CGM downloads. The full report is scanned in the media.  Dexcom G7-  Sensor Download [employer] was reviewed and summarized below.) Dates: August 30 to June 19, 2024, 14 days  Sensor Average: 230  Glucose Management Indicator: 8.8%    Previous:    Previous:     Interpretation: Frequent hyperglycemia postprandially with blood sugar 200-350 since mostly related to not enough meal bolus, late meal bolus and some of the time no meal bolus.  Blood sugars in between the meals relatively acceptable.  No concerning hypoglycemia.  Overall improvement in blood sugar compared to last visit.  Hypoglycemia: Patient has no hypoglycemic episodes. Patient has hypoglycemia awareness.    Factors modifying glucose control: 1.  Diabetic diet assessment: 2-3 meals/ day, snacks at times.   2.  Staying active or exercising: no  3.  Medication compliance: compliant most of the time.  # Hypothyroidism : Longstanding hypothyroidism has been on thyroid  hormone replacement levothyroxine  every morning, taking 112 mcg daily.  # Hypogonadism : He has hypogonadotropic hypogonadism related to his ? metabolic syndrome.  When testosterone  level was low he was having fatigue and decreased motivation and was prescribed with testosterone  supplement.  He was initially on AndroGel  since February 2019 which was subsequently stopped due to cost.  He is currently on Depo-Testosterone  cypionate 120 mg weekly since February 2023.  He does feel better when taking testosterone  injections.  Pretreatment free testosterone  level  was low at 3.7.  Interval history  Pump and CGM data as reviewed above.  He is having frequent postprandial hyperglycemia.  He has been bolusing for correctional boluses without entering carb count.  Pump setting as reviewed above.  He will plan for OmniPod 5 in the last visit, pharmacy is trying to reach out to him to deliver the OmniPod 5, phone number of the pharmacy  provided to the patient in the clinic today.  He did not take Mounjaro  as it was expensive.  He has been on levothyroxine  and testosterone  therapy.  No other complaints today.  REVIEW OF SYSTEMS As per history of present illness.   PAST MEDICAL HISTORY: Past Medical History:  Diagnosis Date   Acute cystitis without hematuria 11/09/2021   AKI (acute kidney injury) 08/08/2017   Elevated sCr to 2.5 (baseline ~1.7) noted at OSH - Subsequent transfer to [facility]  - Currently on IVMF in the setting of diarrhea and AKI - sCr at baseline at time of discharge   Anemia in chronic kidney disease 03/30/2015   Anticoagulated on Coumadin  11/09/2021   Aortic valve stenosis    Arthritis    Atrial fibrillation 11/29/2021   Benign essential hypertension 07/30/2014   prior to transplant pt took Metorpolol tartrate 50mg  qd on off HD days   Benign neoplasm of transverse colon    Cancer    skin ca right shoulder, plastic dsyplasia, pre-Ca polpys removed on Colonoscopy- 07/2014   Cerebrovascular accident (CVA) due to embolism of precerebral artery    Chronic renal failure    Colon polyps 07/23/2014   Tubular adenomas x 5   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   Diverticulosis of colon without hemorrhage    Eczema    ESRD (end stage renal disease)    hemodialysis 06/2014-02/2017, s/p living unrelated kidney  transplant 02/26/17   Exertional dyspnea    Fatigue 01/08/2014   Fissure of skin 03/08/2020   GI bleed 07/26/2017   Headache    Heart murmur    aortic stenosis (moderate-severe 04/2021)   History of COVID-19 2023   History of unilateral nephrectomy 09/06/2013   Hydrocele, acquired 04/07/2017   Hyperlipidemia 09/06/2013   Hypertensive urgency 10/21/2021   Hyponatremia 11/09/2021   Hypothyroidism    Immunosuppressed status 02/26/2017   Induction agent: Solumedrol - Envarsus  3mg  daily  - Home dose decreased from 5mg  to 3mg  in the setting of FK 15.7 while inpatient   - Previously on Prograf , though d/c'ed during previous admission; suspected this may be causing hallucinations  - Cellcept  1000mg  Q12H  - Prednisone  5mg  daily   Inguinal hernia without obstruction or gangrene 06/30/2017   Right inguinal wall  defect appreciated by CT imaging [place] and on exam Dr. Jaquelyn today Dr. Jaquelyn has discussed hernia repair with mesh at some point in near future  Last Assessment & Plan:  Formatting of this note might be different from the original. Right inguinal wall defect appreciated by CT imaging [place] and on exam Dr. Jaquelyn today Dr. Jaquelyn has discussed hern   Kidney transplanted 02/26/2017   Date of Transplant = 02/26/17  ABO (Donor/Recipient) = compatible Blood type: O+/A+ DonorType: Living donor, unrelated Allograft type:Left kidney Donor anatomy: 2 arteries, 1 veins and single ureter  Pants-to-skirt anastomosis of dual renal arteries on the backtable. Donor Kidney Bx: Not applicable Allograft injury/complications: None Ne   Lower extremity edema 09/06/2013   Macular degeneration    Mild vascular neurocognitive disorder 02/08/2022   OAB (overactive bladder) 12/28/2019   Obstructive sleep apnea 09/06/2013   On Bi-PAP  Last Assessment & Plan:  Formatting of this note might be different from the original. - History of OSA, continue CPAP at night   Peritoneal dialysis catheter in place 02/24/2015   RLQ  abdominal pain 04/19/2020   S/P AVR (aortic valve replacement) 10/05/2021   SBO (small bowel obstruction) 05/2015   Scrotal edema 03/14/2017   Testicular ultrasound did show hydrocele  Will refer to urology regarding ongoign swelling, hydrocele.   Last Assessment & Plan:  Formatting of this note might be different from the original.   Testicular ultrasound did show hydrocele  Will refer to urology regarding ongoign swelling, hydrocele.   Shortness of breath    with exertion   Stage 3b chronic kidney disease (CKD) 03/22/2014   SVT (supraventricular tachycardia) 09/06/2013   Tremor of both hands 06/29/2017   Last Assessment & Plan:  Present since transplant, stable and unchanged Query from tacrolimus  -Improved tremors noted and verbalized by pt - Prograf  doses titrated per FK levels  Last Assessment & Plan:  Formatting of this note might be different from the original. Present since transplant, stable and unchanged Query from tacrolimus  -Improved tremors noted and verbalized by pt - Prograf  doses titr   Type 2 diabetes mellitus with hyperglycemia, with long-term current use of insulin  07/14/2015   Umbilical hernia    s/p repair 04/28/15    PAST SURGICAL HISTORY: Past Surgical History:  Procedure Laterality Date   AORTIC VALVE REPLACEMENT N/A 10/05/2021   Procedure: AORTIC VALVE REPLACEMENT (AVR) WITH INSPIRIS RESILIA AORTIC VALVE SIZE ;  Surgeon: Lucas Dorise POUR, MD;  Location: [facility] OR;  Service: Open Heart Surgery;  Laterality: N/A;   AV FISTULA PLACEMENT Right 02/25/2015   Procedure: RIGHT ARTERIOVENOUS (AV) FISTULA CREATION;  Surgeon: Gaile LELON New, MD;  Location: [facility] OR;  Service: Vascular;  Laterality: Right;   AV FISTULA PLACEMENT Right 09/15/2015   Procedure: ARTERIOVENOUS (AV) FISTULA CREATION- RIGHT ARM;  Surgeon: Gaile LELON New, MD;  Location: [facility] OR;  Service: Vascular;  Laterality: Right;   CAPD INSERTION N/A 05/18/2014   Procedure: LAPAROSCOPIC INSERTION CONTINUOUS AMBULATORY  PERITONEAL DIALYSIS  (CAPD) CATHETER;  Surgeon: Lynda Leos, MD;  Location: [facility] OR;  Service: General;  Laterality: N/A;   CARDIOVERSION N/A 04/21/2024   Procedure: CARDIOVERSION;  Surgeon: Delford Maude BROCKS, MD;  Location: [facility] INVAS[facility];  Service: Cardiovascular;  Laterality: N/A;   COLONOSCOPY     COLONOSCOPY WITH PROPOFOL  N/A 07/12/2016   Procedure: COLONOSCOPY WITH PROPOFOL ;  Surgeon: Toribio SHAUNNA Cedar, MD;  Location: [facility] ENDOSCOPY;  Service: Endoscopy;  Laterality: N/A;   CORONARY ARTERY BYPASS GRAFT  N/A 10/05/2021   Procedure: CORONARY ARTERY BYPASS GRAFTING (CABG) X 1, ON PUMP, USING LEFT INTERNAL MAMMARY ARTERY CONDUIT;  Surgeon: Lucas Dorise POUR, MD;  Location: [facility] OR;  Service: Open Heart Surgery;  Laterality: N/A;   declotting of fistula  08/2015   ESOPHAGOGASTRODUODENOSCOPY (EGD) WITH PROPOFOL  N/A 07/12/2016   Procedure: ESOPHAGOGASTRODUODENOSCOPY (EGD) WITH PROPOFOL ;  Surgeon: Toribio SHAUNNA Cedar, MD;  Location: [facility];  Service: Endoscopy;  Laterality: N/A;   FISTULA SUPERFICIALIZATION Right 02/07/2016   Procedure: RIIGHT UPPER ARM FISTULA SUPERFICIALIZATION;  Surgeon: Carlin FORBES Haddock, MD;  Location: [place] OR;  Service: Vascular;  Laterality: Right;   IJ catheter insertion     INSERTION OF DIALYSIS CATHETER Right 02/25/2015   Procedure: INSERTION OF RIGHT INTERNAL JUGULAR DIALYSIS CATHETER;  Surgeon: Gaile LELON New, MD;  Location[facility];  Service: Vascular;  Laterality: Right;   KIDNEY TRANSPLANT     LAPAROSCOPIC REPOSITIONING CAPD CATHETER N/A 06/16/2014   Procedure: LAPAROSCOPIC REPOSITIONING CAPD CATHETER;  Surgeon: Lynda Leos, MD;  Location: [facility];  Service: General;  Laterality: N/A;   LAPAROSCOPY N/A 05/04/2015   Procedure: LAPAROSCOPY DIAGNOSTIC LYSIS OF ADHESIONS;  Surgeon: Vicenta Poli, MD;  Location: [facility];  Service: General;  Laterality: N/A;   MINOR REMOVAL OF PERITONEAL DIALYSIS CATHETER N/A 04/28/2015   Procedure:  REMOVAL OF PERITONEAL DIALYSIS CATHETER;  Surgeon:  Vicenta Poli, MD;  [facility]: [facility];  Service: General;  Laterality: N/A;   NEPHRECTOMY Left 1974   RENAL BIOPSY Right 2012   REVISON OF ARTERIOVENOUS FISTULA Right 05/26/2015   Procedure: SUPERFICIALIZATION OF ARTERIOVENOUS FISTULA WITH SIDE BRANCH LIGATIONS;  Surgeon: Gaile LELON New, MD;  [facility];  Service: Vascular;  Laterality: Right;   RIGHT/LEFT HEART CATH AND CORONARY ANGIOGRAPHY N/A 08/22/2021   Procedure: RIGHT/LEFT HEART CATH AND CORONARY ANGIOGRAPHY;  Surgeon: Burnard Debby LABOR, MD;  [facility];  Service: Cardiovascular;  Laterality: N/A;   SKIN CANCER EXCISION     right shoulder   SVT ABLATION N/A 11/23/2016   Procedure: SVT Ablation;  Surgeon: Will Gladis Norton, MD;  [facility];  Service: Cardiovascular;  Laterality: N/A;   TEE WITHOUT CARDIOVERSION N/A 10/05/2021   Procedure: TRANSESOPHAGEAL ECHOCARDIOGRAM (TEE);  Surgeon: Lucas Dorise POUR, MD;  Location: [facility] OR;  Service: Open Heart Surgery;  Laterality: N/A;   UMBILICAL HERNIA REPAIR N/A 05/18/2014   Procedure: HERNIA REPAIR UMBILICAL ADULT;  Surgeon: Lynda Leos, MD;  Location: [facility] OR;  Service: General;  Laterality: N/A;   UMBILICAL HERNIA REPAIR N/A 04/28/2015   Procedure: UMBILICAL HERNIA REPAIR WITH MESH;  Surgeon: Vicenta Poli, MD;  [facility] OR;  Service: General;  Laterality: N/A;    ALLERGIES: No Known Allergies  FAMILY HISTORY:  Family History  Problem Relation Age of Onset   Colon polyps Father    Stroke Father    Dementia Father        vascular   Colon cancer Neg Hx    Stomach cancer Neg Hx     SOCIAL HISTORY: Social History   Socioeconomic History   Marital status: Married    Spouse name: Not on file   Number of children: 3   Years of education: 16   Highest education level: Bachelor's degree (e.g., BA, AB, BS)  Occupational History   Occupation: English as a second language teacher: [employer]  Tobacco Use   Smoking status: Former    Types: Cigars    Quit  date: 12/06/2012    Years since quitting:  11.5   Smokeless tobacco: Never  Vaping Use   Vaping status: Never Used  Substance and Sexual Activity   Alcohol  use: Not Currently   Drug use: No   Sexual activity: Not on file  Other Topics Concern   Not on file  Social History Narrative   Left handed    Lives with wife   Social Drivers of Health   Financial Resource Strain: Low Risk  (09/18/2023)   Received from [facility]   Overall Financial Resource Strain (CARDIA)    Difficulty of Paying Living Expenses: Not hard at all  Food Insecurity: No Food Insecurity (09/18/2023)   Received from [facility]   Hunger Vital Sign    Within the past 12 months, you worried that your food would run out before you got the money to buy more.: Never true    Within the past 12 months, the food you bought just didn't last and you didn't have money to get more.: Never true  Transportation Needs: No Transportation Needs (09/18/2023)   Received from [facility] - Transportation    In the past 12 months, has lack of transportation kept you from medical appointments or from getting medications?: No    Lack of Transportation (Non-Medical): No  Physical Activity: Not on file  Stress: Not on file  Social Connections: Unknown (02/17/2022)   Received from [facility]   Social Network    Social Network: Not on file    MEDICATIONS:  Current Outpatient Medications  Medication Sig Dispense Refill   acetaminophen  (TYLENOL ) 325 MG tablet Take 2 tablets (650 mg total) by mouth every 4 (four) hours as needed for moderate pain.     albuterol  (VENTOLIN  HFA) 108 (90 Base) MCG/ACT inhaler Inhale 1 puff into the lungs every 6 (six) hours as needed for wheezing or shortness of breath.     apixaban  (ELIQUIS ) 5 MG TABS tablet TAKE 1 TABLET BY MOUTH TWICE A DAY 180 tablet 1   B-D UF III MINI PEN NEEDLES 31G X 5 MM MISC USE TO INJECT INSULIN  4 TIMES A DAY 360  each 2   belatacept  (NULOJIX ) 250 MG SOLR injection Inject 24.5 mLs into the vein every 28 (twenty-eight) days.      carvedilol  (COREG ) 25 MG tablet Take 1 tablet (25 mg total) by mouth 2 (two) times daily with a meal.     Continuous Glucose Sensor (DEXCOM G7 SENSOR) MISC Use to check glucose continuously, change sensor every 10 days 9 each 3   Cranberry-Vitamin C-Probiotic (AZO CRANBERRY PO) Take 1 capsule by mouth daily.     D-MANNOSE PO Take 1 tablet by mouth daily.     DULoxetine  (CYMBALTA ) 30 MG capsule Take 30 mg by mouth daily.     gabapentin  (NEURONTIN ) 300 MG capsule Take 300 mg by mouth at bedtime.     insulin  aspart (NOVOLOG  FLEXPEN) 100 UNIT/ML FlexPen Inject 5-10 Units into the skin as needed for high blood sugar. For hyperglycemia despite bolus via pump. 15 mL 11   insulin  regular human CONCENTRATED (HUMULIN  R) 500 UNIT/ML injection Inject 0.25 mLs (125 Units total) into the skin every 3 (three) days. Omnipod 20 mL 3   levothyroxine  (SYNTHROID ) 112 MCG tablet Take 1 tablet (112 mcg total) by mouth daily. 90 tablet 3   mycophenolate  (CELLCEPT ) 250 MG capsule Take 500 mg by mouth every 12 (twelve) hours.     NEEDLE, DISP, 18 G (BD HYPODERMIC NEEDLE)  18G X 1 MISC USE AS DIRECTED TO INJECT 0.6MLS OF TESTOSTERONE  EVERY 7 DAYS (DRAW UP) 100 each 1   nitroGLYCERIN  (NITROSTAT ) 0.4 MG SL tablet Place 0.4 mg under the tongue every 5 (five) minutes as needed for chest pain.     NON FORMULARY CPAP/BIPAP  at bedtime  BIPAP per pt     predniSONE  (DELTASONE ) 5 MG tablet Take 5 mg by mouth daily with breakfast. Continuously     rosuvastatin  (CRESTOR ) 20 MG tablet Take 1 tablet (20 mg total) by mouth daily. 90 tablet 3   senna-docusate (SENOKOT-S) 8.6-50 MG tablet Take 1 tablet by mouth at bedtime as needed for mild constipation.     spironolactone  (ALDACTONE ) 25 MG tablet Take 0.5 tablets (12.5 mg total) by mouth daily. 45 tablet 3   Syringe, Disposable, 1 ML MISC Use to draw up 0.9ml of  testosterone  medication 25 each 0   SYRINGE-NEEDLE, DISP, 3 ML (B-D 3CC LUER-LOK SYR 23GX1-1/2) 23G X 1-1/2 3 ML MISC USE ONCE EVERY WEEK TO INJECT TESTOSTERONE  50 each 1   SYRINGE-NEEDLE, DISP, 3 ML (BD PLASTIPAK SYRINGE) 21G X 1 3 ML MISC USE AS DIRECTED TO INJECT 0.6MLS OF TESTOSTERONE  EVERY 7 DAYS (INJECT) 100 each 1   tamsulosin  (FLOMAX ) 0.4 MG CAPS capsule Take 0.8 mg by mouth daily after supper.     Testosterone  Cypionate 200 MG/ML SOLN Inject 120 mg as directed once a week. Saturday 10 mL 0   tirzepatide  (MOUNJARO ) 5 MG/0.5ML Pen Inject 5 mg into the skin once a week. After 4 weeks of 2.5mg  dose. 6 mL 3   traMADol  (ULTRAM ) 50 MG tablet Take 50-100 mg by mouth daily as needed for moderate pain (pain score 4-6).     traZODone  (DESYREL ) 50 MG tablet Take 50 mg by mouth at bedtime as needed for sleep.     zolpidem  (AMBIEN ) 10 MG tablet Take 10 mg by mouth at bedtime as needed for sleep.     zonisamide  (ZONEGRAN ) 25 MG capsule Take 3 capsules (75 mg total) by mouth daily. 270 capsule 1   Insulin  Disposable Pump (OMNIPOD 5 DEXG7G6 INTRO GEN 5) KIT 1 each by Does not apply route as needed. (Patient not taking: Reported on 06/19/2024) 1 kit 0   Insulin  Disposable Pump (OMNIPOD 5 DEXG7G6 PODS GEN 5) MISC 1 each by Does not apply route every 3 (three) days. (Patient not taking: Reported on 06/19/2024) 30 each 3   No current facility-administered medications for this visit.    PHYSICAL EXAM: Vitals:   06/19/24 1056  BP: 108/80  Pulse: 68  Resp: 20  SpO2: 98%  Weight: 291 lb 3.2 oz (132.1 kg)  Height: 5' 11 (1.803 m)    Body mass index is 40.61 kg/m.  Wt Readings from Last 3 Encounters:  06/19/24 291 lb 3.2 oz (132.1 kg)  06/17/24 290 lb (131.5 kg)  05/04/24 287 lb (130.2 kg)    General: Well developed, well nourished male in no apparent distress.  HEENT: AT/[place], no external lesions.  Eyes: Conjunctiva clear and no icterus. Neck: Neck supple  Lungs: Respirations not labored. CTA  bilaterally. Neurologic: Alert, oriented, normal speech.  Extremities / Skin: Dry. No pedal edema Psychiatric: Does not appear depressed or anxious  Diabetic Foot Exam - Simple   No data filed    LABS Reviewed Lab Results  Component Value Date   HGBA1C 8.9 (H) 05/06/2024   HGBA1C 8.8 (H) 01/08/2024   HGBA1C 7.7 (H) 09/27/2023   Lab Results  Component Value Date   FRUCTOSAMINE 280 10/29/2022   FRUCTOSAMINE 295 (H) 11/28/2021   FRUCTOSAMINE 296 (H) 07/04/2021   Lab Results  Component Value Date   CHOL 114 01/08/2024   HDL 45 01/08/2024   LDLCALC 52 01/08/2024   TRIG 85 01/08/2024   CHOLHDL 2.5 01/08/2024   No results found for: [facility]  Lab Results  Component Value Date   CREATININE 2.81 (H) 05/06/2024   Lab Results  Component Value Date   GFR 26.95 (L) 07/01/2023    ASSESSMENT / PLAN  1. Uncontrolled type 2 diabetes mellitus with hyperglycemia, with long-term current use of insulin  (HCC)   2. Acquired hypothyroidism   3. Hypogonadism male     Diabetes Mellitus type 2, complicated by diabetic nephropathy and neuropathy. - Diabetic status / severity: Uncontrolled.  Lab Results  Component Value Date   HGBA1C 8.9 (H) 05/06/2024    - Hemoglobin A1c goal <6.5%   Discussed about compliance with bolusing for all meals.  Advised to use carb count and use the bolus wizard to bolus for the meals.  Changed pump setting as follows.  - Medications:   OmniPod Dash with U-500 insulin   Insulin  Pump setting: Previous settings. Basal ( total 14.5) MN- 0.1 u/hour 6AM- 1.25 11AM- 0.8 38M- 0.4  Bolus CHO Ratio (1unit:CHO) MN- 1:12, changed to 1:15.  He is currently not using carb ratio.  Once you start using carb ratio he will be getting increased dose of meal boluses.    Correction/Sensitivity: MN- 1:50  Target: 130   Active insulin  time: 6 hours  He will be getting OmniPod 5, once we gets,  asked to call our clinic to set of diabetic educator visit  for pump training and transition to new pump.  - Home glucose testing: continue CGM /Dexcom G7  and check blood glucose as needed.   - Discussed/ Gave Hypoglycemia treatment plan. # Consult : not required at this time.   # CKD 4, ESRD status post renal transplant in 2018, following with nephrology.  Will check urine microalbumin creatinine ratio today. Last  No results found for: MICRALBCREAT   # Foot check nightly / neuropathy, continue gabapentin , following with podiatry/neurology.  # Annual dilated diabetic eye exams.   - Diet: Make healthy diabetic food choices  2. Blood pressure  -  BP Readings from Last 1 Encounters:  06/19/24 108/80    - Control is in target.  - No change in current plans.  3. Lipid status / Hyperlipidemia - Last  Lab Results  Component Value Date   LDLCALC 52 01/08/2024   - Continue rosuvastatin  20 mg daily.  # Hypothyroidism : Continue levothyroxine  112 mcg daily.  Thyroid  function test was normal in July 2025.  # Hypogonadism : Continue testosterone  cypionate 120 mg weekly.  Testosterone  level is acceptable in July 2025.  Diagnoses and all orders for this visit:  Uncontrolled type 2 diabetes mellitus with hyperglycemia, with long-term current use of insulin  (HCC) -     Microalbumin / creatinine urine ratio  Acquired hypothyroidism  Hypogonadism male   DISPOSITION Follow up in clinic in 6 weeks suggested.     All questions answered and patient verbalized understanding of the plan.  Iraq Verble Styron, MD [facility] Endocrinology [facility] [address] [place], [address] [place], [place] [address] Phone # [phone]  At least part of this note was generated using voice recognition software. Inadvertent word errors may have occurred, which were not recognized during the proofreading  process.

## 2024-06-19 NOTE — Patient Instructions (Signed)
 ASP : fro OmniPOd  Phone: [phone]

## 2024-06-20 LAB — MICROALBUMIN / CREATININE URINE RATIO
Creatinine, Urine: 34 mg/dL (ref 20–320)
Microalb Creat Ratio: 238 mg/g{creat} — ABNORMAL HIGH (ref <30)
Microalb, Ur: 8.1 mg/dL

## 2024-06-22 ENCOUNTER — Ambulatory Visit: Payer: Self-pay | Admitting: Endocrinology

## 2024-06-24 ENCOUNTER — Ambulatory Visit: Payer: Self-pay | Admitting: Neurology

## 2024-06-24 LAB — MUSK ANTIBODIES

## 2024-06-24 LAB — MYASTHENIA GRAVIS PANEL 2
A CHR BINDING ABS: <0.3 nmol/L
ACHR Blocking Abs: <15 %{inhibition} (ref <15)
Acetylchol Modul Ab: 3 %{inhibition}

## 2024-06-30 ENCOUNTER — Ambulatory Visit: Admitting: Physician Assistant

## 2024-06-30 ENCOUNTER — Encounter (HOSPITAL_COMMUNITY): Payer: Self-pay

## 2024-07-02 ENCOUNTER — Other Ambulatory Visit (HOSPITAL_COMMUNITY): Payer: Self-pay

## 2024-07-02 ENCOUNTER — Telehealth: Payer: Self-pay

## 2024-07-02 NOTE — Telephone Encounter (Signed)
 Pharmacy Patient Advocate Encounter   Received notification from Patient Advice Request messages that prior authorization for Humulin  R [id number] is required/requested.   Insurance verification completed.   The patient is insured through [facility] .   Per test claim: The current 90 day co-pay is, $105.  No PA needed at this time. This test claim was processed through [facility]- copay amounts may vary at other pharmacies due to pharmacy/plan contracts, or as the patient moves through the different stages of their insurance plan.     Last filled 06/09/2024

## 2024-07-02 NOTE — Telephone Encounter (Signed)
 Pharmacy Patient Advocate Encounter   Received notification from Patient Advice Request messages that prior authorization for Novolog  is required/requested.   Insurance verification completed.   The patient is insured through [facility] .   Per test claim: Refill too soon. PA is not needed at this time. Medication was filled 06/05/24. Next eligible fill date is 08/11/24.

## 2024-07-02 NOTE — Telephone Encounter (Signed)
 Yes, lets send to PA team.

## 2024-07-02 NOTE — Telephone Encounter (Signed)
 Pharmacy Patient Advocate Encounter   Received notification from Patient Advice Request messages that prior authorization for Testosterone  cypionate 200 mg/ml is required/requested.   Insurance verification completed.   The patient is insured through [facility] .   Per test claim: The current 90 day co-pay is, $45.  No PA needed at this time. This test claim was processed through [facility]- copay amounts may vary at other pharmacies due to pharmacy/plan contracts, or as the patient moves through the different stages of their insurance plan.

## 2024-07-06 ENCOUNTER — Telehealth: Payer: Self-pay | Admitting: Cardiology

## 2024-07-06 NOTE — Telephone Encounter (Signed)
 Pt c/o medication issue:  1. Name of Medication: apixaban  (ELIQUIS ) 5 MG TABS tablet   2. How are you currently taking this medication (dosage and times per day)? As written   3. Are you having a reaction (difficulty breathing--STAT)? No   4. What is your medication issue? Pt states this medication has gotten too expensive for him, are there any patient assistance options he can use? He uses mychart as well, he asked that you send him a message there.

## 2024-07-15 ENCOUNTER — Encounter: Payer: Self-pay | Admitting: Podiatry

## 2024-07-15 ENCOUNTER — Ambulatory Visit: Admitting: Podiatry

## 2024-07-15 DIAGNOSIS — M79675 Pain in left toe(s): Secondary | ICD-10-CM | POA: Diagnosis not present

## 2024-07-15 DIAGNOSIS — B351 Tinea unguium: Secondary | ICD-10-CM | POA: Diagnosis not present

## 2024-07-15 DIAGNOSIS — E1149 Type 2 diabetes mellitus with other diabetic neurological complication: Secondary | ICD-10-CM | POA: Diagnosis not present

## 2024-07-15 DIAGNOSIS — M79674 Pain in right toe(s): Secondary | ICD-10-CM | POA: Diagnosis not present

## 2024-07-15 NOTE — Telephone Encounter (Signed)
 Covering for [employer]. If patient cannot get Eliquis  for another 3 month, would generic pradaxa be a reasonable alternative. I believe it is around $75 at [facility] with goodRx coupon card. Patient has h/o AVR, CABG and CKD stage IV, history of renal transplant. Is there a renal dosing for pradaxa?

## 2024-07-15 NOTE — Progress Notes (Signed)
 This patient returns to my office for at risk foot care.  This patient requires this care by a professional since this patient will be at risk due to having uncontrolled diabetes and kidney transplant.  This patient is unable to cut nails himself since the patient cannot reach his nails.These nails are painful walking and wearing shoes.  Patient says he lost his second toenail right foot on Friday.  The toe is presently healing uneventfully.   This patient presents for at risk foot care today.  General Appearance  Alert, conversant and in no acute stress.  Vascular  Dorsalis pedis and posterior tibial  pulses are palpable  bilaterally.  Capillary return is within normal limits  bilaterally. Temperature is within normal limits  bilaterally.  Neurologic  Senn-Weinstein monofilament wire test diminished   bilaterally. Muscle power within normal limits bilaterally.  Nails Thick disfigured discolored nails with subungual debris  from hallux to fifth toes bilaterally. No evidence of bacterial infection or drainage bilaterally.  Orthopedic  No limitations of motion  feet .  No crepitus or effusions noted.  No bony pathology or digital deformities noted. HAV  B/L.  Skin  normotropic skin with no porokeratosis noted bilaterally.  No signs of infections or ulcers noted.   U  Onychomycosis  Pain in right toes  Pain in left toes  Ulcer second toe right foot.  Consent was obtained for treatment procedures.   Mechanical debridement of nails 1-5  bilaterally performed with a nail nipper.  Filed with dremel without incident.    Return office visit  3 months                   Told patient to return for periodic foot care and evaluation due to potential at risk complications.   Cordella Bold DPM

## 2024-07-16 NOTE — Telephone Encounter (Signed)
  Pradaxa generic 1 month is around $55 at [facility] with [facility]

## 2024-07-17 NOTE — Telephone Encounter (Signed)
 Can we do some sample for Eliquis , and maybe renal dosing of Xarelto for 1 month (with manufacture coupon, assume he never used it before). Will that work to last him until the end of the year, if not, only option would be coumadin  which I think he was on at one point.  Nohelia Valenza

## 2024-07-20 ENCOUNTER — Telehealth: Payer: Self-pay | Admitting: Pharmacy Technician

## 2024-07-20 ENCOUNTER — Other Ambulatory Visit (HOSPITAL_COMMUNITY): Payer: Self-pay

## 2024-07-20 ENCOUNTER — Encounter (HOSPITAL_COMMUNITY): Payer: Self-pay

## 2024-07-20 NOTE — Telephone Encounter (Signed)
 Maccia, Melissa D, RPH-CPP to Janene Boer, [place]  Rx Med Assistance Team     07/20/24  7:49 AM His echo showed LVH and diastolic dysfunction. We may be able to get him a grant for cardiomyopathy which would cover his Eliquis .   Patient Advocate Encounter   The patient was approved for a [employer] grant that will help cover the cost of eliquis  Total amount awarded, 7500.  Effective: 06/20/24 - 06/19/25   APW:[id number] ERW:[id number] Hmnle:[id number] PI:[id number] [employer] ID: [id number]   Pharmacy provided with approval and processing information. Patient informed via mychart

## 2024-07-23 ENCOUNTER — Telehealth: Payer: Self-pay | Admitting: Dietician

## 2024-07-23 NOTE — Telephone Encounter (Signed)
 Returned patient call.  He states that he is on his last Omnipod DASH Pod and needs trained on the Omnipod 5.  He states that he will continue to use U-500 insulin  in the pump. Will see him tomorrow morning for training.  Leita Constable, RD, LDN, CDCES, DipACLM

## 2024-07-24 ENCOUNTER — Other Ambulatory Visit: Payer: Self-pay | Admitting: Endocrinology

## 2024-07-24 ENCOUNTER — Encounter: Attending: Endocrinology | Admitting: Dietician

## 2024-07-24 DIAGNOSIS — E119 Type 2 diabetes mellitus without complications: Secondary | ICD-10-CM | POA: Insufficient documentation

## 2024-07-24 NOTE — Telephone Encounter (Signed)
 Refill request complete

## 2024-07-24 NOTE — Progress Notes (Signed)
 Patient was trained on the Omnipod 5 insulin  pump using the Dexcom CGM with their Omnipod PDM.  He is a previous Omnipod DASH user and ran out of DASH PODs last night.  Start time:  1005  End time:  1110 The pump and POD were paired.  Retraining is not billable. [employer]o was set up.  Password [email or web address]  [place] User name Drewre and Training and development officer   Patient was trained on the following: OMNIPOD 5 AUTOMATED INSULIN  DELIVERY SYSTEM POD START CHECKLIST:  RESOURCES AND SUPPORT  Omnipod 24/7 Customer Care [phone]  User Guide  Reviewed Customer's Bill of Rights & Responsibilities  Reviewed indications, contraindications, and safety precautions  Onboarding website, link data management account  SYSTEM FIRST TIME SETUP Omnipod 5 App set up  Choose a controller (compatible smartphone vs. Provided Controller)   Connectivity (auto=uploading of data and updates)  Omnipod ID  Controller specific settings (Ex:  Notification, Press photographer & optomization)  Basic settings - Personalized lock screen/time/time zone/date/date format  Initial insulin  settings from Pump Therapy Order Form POD  Fill port/adhesive/needle cap/pink slide insert/waterproof IP28/storage guidelines  Communication Process/distance  The following settings were programmed into the pump:  Per MD - DASH settings were transferred.  Max basal:  2.5 U/hr  Basal rate:  0.1 U/hr 12AM-6AM          1.25 u/hr 6 AM - 11 AM                                0.8 u/hr  11 AM - 11 AM- 9 PM                                0.4 u/hr 9 PM - 12 AM  Target Glucose:  130  Correct Above:  150  ICR:  15 g/unit 12AM-12AM  ISF (Correction Factor):  50 mg/dL/unit  Duration of Insulin  Action:  6 hours  Max Bolus:  15 units  Reverse Correction:  OFF  POD ACTIVATION/DEACTIVATION  Activate POD   Room temperature insulin    Fill syringe - min/max amounts   DO NOT prefill Pod   Site selection/rotation & prep    Pod & sensor in direct  line of sight   Automated cannula insertion - check infusion site/viewing window & pink slide insert  When to change Pod and removal  Steps to deactivate  CONNECT TO SENSOR  Review use of sensor app  Pair/connect sensor to pod  Connection process  AUTOMATED MODE  Active with sensor values  OMNIPOD 5 APP OVERVIEW Home Screen  Status Bar, Menu Icon, Notification/[place], System Mode Indicator (Automated or Manual)  Tabs   Dashboard - IOB, sensor value and trend   Insulin    Pod Info - View Pod Details  Last Bolus Icon  Sensor Graph  Menu Icon Overview  Switch Mode  Pause  History Details & Notifications  Settings - Basal Programs, Bolus  System Status  Automated Mode:  Limited (missing sensor values, sensor warm-up)  No Pod Communication  Advanced Features  Additional education prior to use (I.e. Activity features, extended bolus, temp basal, additional basal programs)   Customize Screen & Alerts  POD expiration alert  Low Pod Insulin  alert  ADVISORY & HAZARD ALARMS  Advisory alarms - response required  Hazard alarm - urgent attention required  SAFETY  Hypoglycemia  Hyperglycemia, ketones & DKA  Sick day management  KEY INSULIN  DELIVERY ACTIONS Switch Mode  Bolus deliver  Change Pod  Troubleshoot BG/sensor values out of range   Leita Constable, RD, LDN, CDCES, DipACLM

## 2024-08-11 ENCOUNTER — Encounter: Payer: Self-pay | Admitting: Endocrinology

## 2024-08-11 ENCOUNTER — Telehealth: Payer: Self-pay

## 2024-08-11 ENCOUNTER — Other Ambulatory Visit (HOSPITAL_COMMUNITY): Payer: Self-pay

## 2024-08-11 DIAGNOSIS — E1165 Type 2 diabetes mellitus with hyperglycemia: Secondary | ICD-10-CM

## 2024-08-11 NOTE — Telephone Encounter (Signed)
 Pharmacy Patient Advocate Encounter   Received notification from Physician's Office that prior authorization for Dexcom G7 sensor is required/requested.   Insurance verification completed.   The patient is insured through [employer].   Per test claim: Medication is not eligible for pharmacy benefits and must be billed through medical insurance. As our team only handles pharmacy related prior auths, medical PA's must be submitted by the clinic. Thank you

## 2024-08-11 NOTE — Telephone Encounter (Signed)
 [facility] order for Dexcom has been placed.

## 2024-08-11 NOTE — Telephone Encounter (Signed)
 Pt needs a PA for Dexcom G7. Thanks.

## 2024-08-12 ENCOUNTER — Telehealth: Payer: Self-pay

## 2024-08-12 NOTE — Telephone Encounter (Signed)
 Patient called and made aware that the order for for his CGM was placed through [employer] portal. Patient made aware he may get a call from [facility]. No further questions at this tie, patient stated an understanding.

## 2024-08-13 ENCOUNTER — Ambulatory Visit: Payer: Self-pay | Admitting: Endocrinology

## 2024-08-13 LAB — OPHTHALMOLOGY REPORT-SCANNED

## 2024-08-14 MED ORDER — DEXCOM G7 SENSOR MISC: 3 refills | Status: AC

## 2024-08-14 NOTE — Telephone Encounter (Addendum)
 Called and talked to the patient.  He was not able to get Dexcom G7 refilled from [facility].  He talked with different pharmacy and mentioned he is getting Dexcom G7 from different pharmacy and I do need to send  prescription.  Previously, our nursing staff sent for Dexcom G7 to the PA team as well.  He was upset with not getting answers on the phone calls from our clinic.  All questions answered.  No additional concern at this time from patient.

## 2024-08-20 ENCOUNTER — Ambulatory Visit: Payer: Self-pay | Admitting: Endocrinology

## 2024-08-20 ENCOUNTER — Ambulatory Visit (INDEPENDENT_AMBULATORY_CARE_PROVIDER_SITE_OTHER): Admitting: Endocrinology

## 2024-08-20 ENCOUNTER — Encounter: Payer: Self-pay | Admitting: Endocrinology

## 2024-08-20 VITALS — BP 136/78 | HR 72 | Resp 20 | Ht 71.0 in | Wt 295.0 lb

## 2024-08-20 DIAGNOSIS — Z794 Long term (current) use of insulin: Secondary | ICD-10-CM

## 2024-08-20 DIAGNOSIS — E039 Hypothyroidism, unspecified: Secondary | ICD-10-CM

## 2024-08-20 DIAGNOSIS — E1165 Type 2 diabetes mellitus with hyperglycemia: Secondary | ICD-10-CM

## 2024-08-20 DIAGNOSIS — E291 Testicular hypofunction: Secondary | ICD-10-CM

## 2024-08-20 LAB — POCT GLYCOSYLATED HEMOGLOBIN (HGB A1C): Hemoglobin A1C: 8.1 % — AB (ref 4.0–5.6)

## 2024-08-20 MED ORDER — LEVOTHYROXINE SODIUM 112 MCG PO TABS: 112.0000 ug | ORAL_TABLET | Freq: Every day | ORAL | 3 refills | Status: AC

## 2024-08-20 NOTE — Patient Instructions (Signed)
 ABI  Ankle Brachial index consider

## 2024-08-20 NOTE — Progress Notes (Signed)
 Outpatient Endocrinology Note Ethelbert Thain, MD  08/20/24  Patient's Name: Robert [name]    DOB: 1961/09/04    MRN: [id number]                                                    REASON OF VISIT: Follow up for type 2 diabetes mellitus  PCP: [name], Charlie ORN, MD  HISTORY OF PRESENT ILLNESS:   Robert [name] is a 63 y.o. old male with past medical history listed below, is here for follow up of type 2 diabetes mellitus / hypothyroidism / hypogonadism.   Pertinent Diabetes History: Patient was diagnosed with type 2 diabetes mellitus in 2012.  He was initially treated with oral hypoglycemic drugs like glyburide but could not take metformin because of renal dysfunction.  He had been on Victoza  and later started on insulin  Lantus  and then U-500 insulin .  Later started on insulin  pump, OmniPod DASH with U-500 insulin .  On OmniPod 5 since October 2025.  Chronic Diabetes Complications : Retinopathy: no. Last ophthalmology exam was done on annually reportedly. Nephropathy: yes, CKD IV, ESRD s/p renal transplant in 2018.  Peripheral neuropathy: yes, on gabapentin  , following with podiatry regularly. Coronary artery disease: no Stroke: no  Relevant comorbidities and cardiovascular risk factors: Obesity: yes Body mass index is 41.14 kg/m.  Hypertension: yes Hyperlipidemia. Yes, on statin.   Current / Home Diabetic regimen includes:  OmniPod 5 with Dexcom G7 using U-500 insulin   Insulin  Pump setting: Previous settings. Basal ( total 14.5) MN- 0.1 u/hour 6AM- 1.25 11AM- 0.8 37M- 0.4  Bolus CHO Ratio (1unit:CHO) MN- 1:15  Correction/Sensitivity: MN- 1:50  Target: 130   Active insulin  time: 6 hours  Prior diabetic medications: Tried Ozempic  could not tolerate.  Previously [employer].  CONTINUOUS GLUCOSE MONITORING SYSTEM (CGMS) / INSULIN  PUMP INTERPRETATION:                         Omnipod DASH Pump & Dexcom G7 Sensor Download (Reviewed and summarized below.)  Date :  October 31 to August 20, 2024, 14 days  Total daily dose 14 units, basal 84% and bolus 16%.  He has been using U-500 insulin .  He has been bolusing for meals 0-1 times a day.    Interpretation: Frequent hyperglycemia postprandially with no meal boluses.  He has been bolusing for meals rarely.  He is mostly on manual mode and blood sugar trending down with mild hypoglycemia around early afternoon with the basal insulin .  No hypoglycemia when on automated mode.  Hyperglycemia with blood sugar up to 250-300 range.  Hypoglycemia: Patient has no hypoglycemic episodes. Patient has hypoglycemia awareness.    Factors modifying glucose control: 1.  Diabetic diet assessment: 2-3 meals/ day, snacks at times.   2.  Staying active or exercising: no  3.  Medication compliance: compliant most of the time.  # Hypothyroidism : Longstanding hypothyroidism has been on thyroid  hormone replacement levothyroxine  every morning, taking 112 mcg daily.  # Hypogonadism : He has hypogonadotropic hypogonadism related to his ? metabolic syndrome.  When testosterone  level was low he was having fatigue and decreased motivation and was prescribed with testosterone  supplement.  He was initially on AndroGel  since February 2019 which was subsequently stopped due to cost.  He is currently on Depo-Testosterone  cypionate 120 mg  weekly since February 2023.  He does feel better when taking testosterone  injections.  Pretreatment free testosterone  level was low at 3.7.  Interval history  He is now on OmniPod 5 with Dexcom G7 since October.  Insulin  pump and CGM data as reviewed above.  Recently he was not able to refill Dexcom G7, however later unable to get it from different pharmacy.  He has complaints of occasional connectivity issue with pump and the Dexcom otherwise no concern.  Hemoglobin A1c slightly improved to 8.1%.  He still has postprandial hyperglycemia related to missing mealtime boluses.  He has been taking  levothyroxine  112 mg daily.  Patient reports he misplaced the medication bottle and has not been taking from yesterday.  He has been on testosterone  therapy.  Patient has complaints of numbness and ting of the feet and pain, he reports gabapentin  dose was recently increased by primary care provider.  No other complaints today.  REVIEW OF SYSTEMS As per history of present illness.   PAST MEDICAL HISTORY: Past Medical History:  Diagnosis Date   Acute cystitis without hematuria 11/09/2021   AKI (acute kidney injury) 08/08/2017   Elevated sCr to 2.5 (baseline ~1.7) noted at OSH - Subsequent [facility] to [facility]  - Currently on IVMF in the setting of diarrhea and AKI - sCr at baseline at time of discharge   Anemia in chronic kidney disease 03/30/2015   Anticoagulated on Coumadin  11/09/2021   Aortic valve stenosis    Arthritis    Atrial fibrillation 11/29/2021   Benign essential hypertension 07/30/2014   prior to transplant pt took Metorpolol tartrate 50mg  qd on off HD days   Benign neoplasm of transverse colon    Cancer    skin ca right shoulder, plastic dsyplasia, pre-Ca polpys removed on Colonoscopy- 07/2014   Cerebrovascular accident (CVA) due to embolism of precerebral artery    Chronic renal failure    Colon polyps 07/23/2014   Tubular adenomas x 5   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   Diverticulosis of colon without hemorrhage    Eczema    ESRD (end stage renal disease)    hemodialysis 06/2014-02/2017, s/p living unrelated kidney transplant 02/26/17   Exertional dyspnea    Fatigue 01/08/2014   Fissure of skin 03/08/2020   GI bleed 07/26/2017   Headache    Heart murmur    aortic stenosis (moderate-severe 04/2021)   History of COVID-19 2023    History of unilateral nephrectomy 09/06/2013   Hydrocele, acquired 04/07/2017   Hyperlipidemia 09/06/2013   Hypertensive urgency 10/21/2021   Hyponatremia 11/09/2021   Hypothyroidism    Immunosuppressed status 02/26/2017   Induction agent: Solumedrol - Envarsus  3mg  daily  - Home dose decreased from 5mg  to 3mg  in the setting of FK 15.7 while inpatient   - Previously on Prograf , though d/c'ed during previous admission; suspected this may be causing hallucinations  - Cellcept  1000mg  Q12H  - Prednisone  5mg  daily   Inguinal hernia without obstruction or gangrene 06/30/2017   Right inguinal wall defect appreciated by CT imaging [place] and on exam Dr. Jaquelyn today Dr. Jaquelyn has discussed hernia repair with mesh at some point in near future  Last Assessment & Plan:  Formatting of this note might be different from the original. Right inguinal  wall defect appreciated by CT imaging [place] and on exam Dr. Jaquelyn today Dr. Jaquelyn has discussed hern   Kidney transplanted 02/26/2017   Date of Transplant = 02/26/17  ABO (Donor/Recipient) = compatible Blood type: O+/A+ DonorType: Living donor, unrelated Allograft type:Left kidney Donor anatomy: 2 arteries, 1 veins and single ureter  Pants-to-skirt anastomosis of dual renal arteries on the backtable. Donor Kidney Bx: Not applicable Allograft injury/complications: None Ne   Lower extremity edema 09/06/2013   Macular degeneration    Mild vascular neurocognitive disorder 02/08/2022   OAB (overactive bladder) 12/28/2019   Obstructive sleep apnea 09/06/2013   On Bi-PAP  Last Assessment & Plan:  Formatting of this note might be different from the original. - History of OSA, continue CPAP at night   Peritoneal dialysis catheter in place 02/24/2015   RLQ abdominal pain 04/19/2020   S/P AVR (aortic valve replacement) 10/05/2021   SBO (small bowel obstruction) 05/2015   Scrotal edema 03/14/2017   Testicular ultrasound did show hydrocele  Will refer to urology regarding  ongoign swelling, hydrocele.   Last Assessment & Plan:  Formatting of this note might be different from the original.   Testicular ultrasound did show hydrocele  Will refer to urology regarding ongoign swelling, hydrocele.   Shortness of breath    with exertion   Stage 3b chronic kidney disease (CKD) 03/22/2014   SVT (supraventricular tachycardia) 09/06/2013   Tremor of both hands 06/29/2017   Last Assessment & Plan:  Present since transplant, stable and unchanged Query from tacrolimus  -Improved tremors noted and verbalized by pt - Prograf  doses titrated per FK levels  Last Assessment & Plan:  Formatting of this note might be different from the original. Present since transplant, stable and unchanged Query from tacrolimus  -Improved tremors noted and verbalized by pt - Prograf  doses titr   Type 2 diabetes mellitus with hyperglycemia, with long-term current use of insulin  07/14/2015   Umbilical hernia    s/p repair 04/28/15    PAST SURGICAL HISTORY: Past Surgical History:  Procedure Laterality Date   AORTIC VALVE REPLACEMENT N/A 10/05/2021   Procedure: AORTIC VALVE REPLACEMENT (AVR) WITH INSPIRIS RESILIA AORTIC VALVE SIZE ;  Surgeon: Lucas Dorise POUR, MD;  Location: [facility] OR;  Service: Open Heart Surgery;  Laterality: N/A;   AV FISTULA PLACEMENT Right 02/25/2015   Procedure: RIGHT ARTERIOVENOUS (AV) FISTULA CREATION;  Surgeon: Gaile LELON New, MD;  Location: [facility] OR;  Service: Vascular;  Laterality: Right;   AV FISTULA PLACEMENT Right 09/15/2015   Procedure: ARTERIOVENOUS (AV) FISTULA CREATION- RIGHT ARM;  Surgeon: Gaile LELON New, MD;  Location: [facility] OR;  Service: Vascular;  Laterality: Right;   CAPD INSERTION N/A 05/18/2014   Procedure: LAPAROSCOPIC INSERTION CONTINUOUS AMBULATORY PERITONEAL DIALYSIS  (CAPD) CATHETER;  Surgeon: Lynda Leos, MD;  Location: [facility] OR;  Service: General;  Laterality: N/A;   CARDIOVERSION N/A 04/21/2024   Procedure: CARDIOVERSION;  Surgeon: Delford Maude BROCKS, MD;   [facility]: [facility];  Service: Cardiovascular;  Laterality: N/A;   COLONOSCOPY     COLONOSCOPY WITH PROPOFOL  N/A 07/12/2016   Procedure: COLONOSCOPY WITH PROPOFOL ;  Surgeon: Toribio SHAUNNA Cedar, MD;  Location: [facility];  Service: Endoscopy;  Laterality: N/A;   CORONARY ARTERY BYPASS GRAFT N/A 10/05/2021   Procedure: CORONARY ARTERY BYPASS GRAFTING (CABG) X 1, ON PUMP, USING LEFT INTERNAL MAMMARY ARTERY CONDUIT;  Surgeon: Lucas Dorise POUR, MD;  Location: [facility] OR;  Service: Open Heart Surgery;  Laterality: N/A;   declotting of fistula  08/2015  ESOPHAGOGASTRODUODENOSCOPY (EGD) WITH PROPOFOL  N/A 07/12/2016   Procedure: ESOPHAGOGASTRODUODENOSCOPY (EGD) WITH PROPOFOL ;  Surgeon: Toribio SHAUNNA Cedar, MD;  Location: [facility];  Service: Endoscopy;  Laterality: N/A;   FISTULA SUPERFICIALIZATION Right 02/07/2016   Procedure: RIIGHT UPPER ARM FISTULA SUPERFICIALIZATION;  Surgeon: Carlin FORBES Haddock, MD;  Location: [name] OR;  Service: Vascular;  Laterality: Right;   IJ catheter insertion     INSERTION OF DIALYSIS CATHETER Right 02/25/2015   Procedure: INSERTION OF RIGHT INTERNAL JUGULAR DIALYSIS CATHETER;  Surgeon: Gaile LELON New, MD;  Location: [facility] OR;  Service: Vascular;  Laterality: Right;   KIDNEY TRANSPLANT     LAPAROSCOPIC REPOSITIONING CAPD CATHETER N/A 06/16/2014   Procedure: LAPAROSCOPIC REPOSITIONING CAPD CATHETER;  Surgeon: Lynda Leos, MD;  Location: [facility] OR;  Service: General;  Laterality: N/A;   LAPAROSCOPY N/A 05/04/2015   Procedure: LAPAROSCOPY DIAGNOSTIC LYSIS OF ADHESIONS;  Surgeon: Vicenta Poli, MD;  Location: [facility];  Service: General;  Laterality: N/A;   MINOR REMOVAL OF PERITONEAL DIALYSIS CATHETER N/A 04/28/2015   Procedure:  REMOVAL OF PERITONEAL DIALYSIS CATHETER;  Surgeon: Vicenta Poli, MD;  Location: [facility] OR;  Service: General;  Laterality: N/A;   NEPHRECTOMY Left 1974   RENAL BIOPSY Right 2012   REVISON OF ARTERIOVENOUS FISTULA Right 05/26/2015   Procedure: SUPERFICIALIZATION OF  ARTERIOVENOUS FISTULA WITH SIDE BRANCH LIGATIONS;  Surgeon: Gaile LELON New, MD;  Location: [facility] OR;  Service: Vascular;  Laterality: Right;   RIGHT/LEFT HEART CATH AND CORONARY ANGIOGRAPHY N/A 08/22/2021   Procedure: RIGHT/LEFT HEART CATH AND CORONARY ANGIOGRAPHY;  Surgeon: Burnard Debby LABOR, MD;  [facility];  Service: Cardiovascular;  Laterality: N/A;   SKIN CANCER EXCISION     right shoulder   SVT ABLATION N/A 11/23/2016   Procedure: SVT Ablation;  Surgeon: Will Gladis Norton, MD;  [facility];  Service: Cardiovascular;  Laterality: N/A;   TEE WITHOUT CARDIOVERSION N/A 10/05/2021   Procedure: TRANSESOPHAGEAL ECHOCARDIOGRAM (TEE);  Surgeon: Lucas Dorise POUR, MD;  Location: [facility] OR;  Service: Open Heart Surgery;  Laterality: N/A;   UMBILICAL HERNIA REPAIR N/A 05/18/2014   Procedure: HERNIA REPAIR UMBILICAL ADULT;  Surgeon: Lynda Leos, MD;  Location: [facility] OR;  Service: General;  Laterality: N/A;   UMBILICAL HERNIA REPAIR N/A 04/28/2015   Procedure: UMBILICAL HERNIA REPAIR WITH MESH;  Surgeon: Vicenta Poli, MD;  Location: [facility] OR;  Service: General;  Laterality: N/A;    ALLERGIES: No Known Allergies  FAMILY HISTORY:  Family History  Problem Relation Age of Onset   Colon polyps Father    Stroke Father    Dementia Father        vascular   Colon cancer Neg Hx    Stomach cancer Neg Hx     SOCIAL HISTORY: Social History   Socioeconomic History   Marital status: Married    Spouse name: Not on file   Number of children: 3   Years of education: 16   Highest education level: Bachelor's degree (e.g., BA, AB, BS)  Occupational History   Occupation: English As A Second Language Teacher: [employer]  Tobacco Use   Smoking status: Former    Types: Cigars    Quit date: 12/06/2012    Years since quitting: 11.7   Smokeless tobacco: Never  Vaping Use   Vaping status: Never Used  Substance and Sexual Activity   Alcohol  use: Not Currently   Drug use: No   Sexual activity: Not  on file  Other Topics Concern   Not on file  Social History Narrative   Left handed    Lives with wife   Social Drivers of Health   Financial Resource Strain: Low Risk  (09/18/2023)   Received from [facility]   Overall Financial Resource Strain (CARDIA)    Difficulty of Paying Living Expenses: Not hard at all  Food Insecurity: No Food Insecurity (09/18/2023)   Received from [facility]   Hunger Vital Sign    Within the past 12 months, you worried that your food would run out before you got the money to buy more.: Never true    Within the past 12 months, the food you bought just didn't last and you didn't have money to get more.: Never true  Transportation Needs: No Transportation Needs (09/18/2023)   Received from [facility] - Transportation    In the past 12 months, has lack of transportation kept you from medical appointments or from getting medications?: No    Lack of Transportation (Non-Medical): No  Physical Activity: Not on file  Stress: Not on file  Social Connections: Unknown (02/17/2022)   Received from [facility]   Social Network    Social Network: Not on file    MEDICATIONS:  Current Outpatient Medications  Medication Sig Dispense Refill   acetaminophen  (TYLENOL ) 325 MG tablet Take 2 tablets (650 mg total) by mouth every 4 (four) hours as needed for moderate pain.     albuterol  (VENTOLIN  HFA) 108 (90 Base) MCG/ACT inhaler Inhale 1 puff into the lungs every 6 (six) hours as needed for wheezing or shortness of breath.     apixaban  (ELIQUIS ) 5 MG TABS tablet TAKE 1 TABLET BY MOUTH TWICE A DAY 180 tablet 1   B-D UF III MINI PEN NEEDLES 31G X 5 MM MISC USE TO INJECT INSULIN  4 TIMES A DAY 360 each 2   belatacept  (NULOJIX ) 250 MG SOLR injection Inject 24.5 mLs into the vein every 28 (twenty-eight) days.      carvedilol  (COREG ) 25 MG tablet Take 1 tablet (25 mg total) by mouth 2 (two) times daily with a  meal.     Continuous Glucose Sensor (DEXCOM G7 SENSOR) MISC Use to check glucose continuously, change sensor every 10 days 9 each 3   Cranberry-Vitamin C-Probiotic (AZO CRANBERRY PO) Take 1 capsule by mouth daily.     D-MANNOSE PO Take 1 tablet by mouth daily.     DULoxetine  (CYMBALTA ) 30 MG capsule Take 30 mg by mouth daily.     gabapentin  (NEURONTIN ) 300 MG capsule Take 300 mg by mouth at bedtime.     HUMULIN  R 500 UNIT/ML injection INJECT 0.25 MLS (125 UNITS TOTAL) INTO THE SKIN EVERY 3 (THREE) DAYS. OMNIPOD 20 mL 3   insulin  aspart (NOVOLOG  FLEXPEN) 100 UNIT/ML FlexPen Inject 5-10 Units into the skin as needed for high blood sugar. For hyperglycemia despite bolus via pump. 15 mL 11   Insulin  Disposable Pump (OMNIPOD 5 DEXG7G6 INTRO GEN 5) KIT 1 each by Does not apply route as needed. 1 kit 0   Insulin  Disposable Pump (OMNIPOD 5 DEXG7G6 PODS GEN 5) MISC 1 each by Does not apply route every 3 (three) days. 30 each 3   mycophenolate  (CELLCEPT ) 250 MG capsule Take 500 mg by mouth every 12 (twelve) hours.     NEEDLE, DISP, 18 G (BD HYPODERMIC NEEDLE) 18G X 1 MISC USE AS DIRECTED TO INJECT 0.6MLS OF TESTOSTERONE  EVERY 7 DAYS (DRAW UP) 100 each 1  nitroGLYCERIN  (NITROSTAT ) 0.4 MG SL tablet Place 0.4 mg under the tongue every 5 (five) minutes as needed for chest pain.     NON FORMULARY CPAP/BIPAP  at bedtime  BIPAP per pt     predniSONE  (DELTASONE ) 5 MG tablet Take 5 mg by mouth daily with breakfast. Continuously     rosuvastatin  (CRESTOR ) 20 MG tablet Take 1 tablet (20 mg total) by mouth daily. 90 tablet 3   senna-docusate (SENOKOT-S) 8.6-50 MG tablet Take 1 tablet by mouth at bedtime as needed for mild constipation.     spironolactone  (ALDACTONE ) 25 MG tablet Take 0.5 tablets (12.5 mg total) by mouth daily. 45 tablet 3   Syringe, Disposable, 1 ML MISC Use to draw up 0.8ml of testosterone  medication 25 each 0   SYRINGE-NEEDLE, DISP, 3 ML (B-D 3CC LUER-LOK SYR 23GX1-1/2) 23G X 1-1/2 3 ML MISC USE  ONCE EVERY WEEK TO INJECT TESTOSTERONE  50 each 1   SYRINGE-NEEDLE, DISP, 3 ML (BD PLASTIPAK SYRINGE) 21G X 1 3 ML MISC USE AS DIRECTED TO INJECT 0.6MLS OF TESTOSTERONE  EVERY 7 DAYS (INJECT) 100 each 1   tamsulosin  (FLOMAX ) 0.4 MG CAPS capsule Take 0.8 mg by mouth daily after supper.     Testosterone  Cypionate 200 MG/ML SOLN Inject 120 mg as directed once a week. Saturday 10 mL 0   traMADol  (ULTRAM ) 50 MG tablet Take 50-100 mg by mouth daily as needed for moderate pain (pain score 4-6).     traZODone  (DESYREL ) 50 MG tablet Take 50 mg by mouth at bedtime as needed for sleep.     zolpidem  (AMBIEN ) 10 MG tablet Take 10 mg by mouth at bedtime as needed for sleep.     zonisamide  (ZONEGRAN ) 25 MG capsule Take 3 capsules (75 mg total) by mouth daily. 270 capsule 1   levothyroxine  (SYNTHROID ) 112 MCG tablet Take 1 tablet (112 mcg total) by mouth daily. 90 tablet 3   No current facility-administered medications for this visit.    PHYSICAL EXAM: Vitals:   08/20/24 1102  BP: 136/78  Pulse: 72  Resp: 20  SpO2: 96%  Weight: 295 lb (133.8 kg)  Height: 5' 11 (1.803 m)    Body mass index is 41.14 kg/m.  Wt Readings from Last 3 Encounters:  08/20/24 295 lb (133.8 kg)  06/19/24 291 lb 3.2 oz (132.1 kg)  06/17/24 290 lb (131.5 kg)    General: Well developed, well nourished male in no apparent distress.  HEENT: AT/[place], no external lesions.  Eyes: Conjunctiva clear and no icterus. Neck: Neck supple  Lungs: Respirations not labored. CTA bilaterally. Neurologic: Alert, oriented, normal speech.  Extremities / Skin: Dry. No pedal edema Psychiatric: Does not appear depressed or anxious  Diabetic Foot Exam - Simple   Simple Foot Form Diabetic Foot exam was performed with the following findings: Yes 08/20/2024 11:26 AM  Visual Inspection See comments: Yes Sensation Testing See comments: Yes Pulse Check Posterior Tibialis and Dorsalis pulse intact bilaterally: Yes Comments Dystrophic nails  bilaterally.  No ulcer.  Dry feet.  Bilateral dorsalis pedis palpable feeble.  Monofilament sensory exam absent bilaterally except on left great toe.    LABS Reviewed Lab Results  Component Value Date   HGBA1C 8.1 (A) 08/20/2024   HGBA1C 8.9 (H) 05/06/2024   HGBA1C 8.8 (H) 01/08/2024   Lab Results  Component Value Date   FRUCTOSAMINE 280 10/29/2022   FRUCTOSAMINE 295 (H) 11/28/2021   FRUCTOSAMINE 296 (H) 07/04/2021   Lab Results  Component Value Date  CHOL 114 01/08/2024   HDL 45 01/08/2024   LDLCALC 52 01/08/2024   TRIG 85 01/08/2024   CHOLHDL 2.5 01/08/2024   Lab Results  Component Value Date   MICRALBCREAT 238 (H) 06/19/2024    Lab Results  Component Value Date   CREATININE 2.81 (H) 05/06/2024   Lab Results  Component Value Date   GFR 26.95 (L) 07/01/2023    ASSESSMENT / PLAN  1. Uncontrolled type 2 diabetes mellitus with hyperglycemia, with long-term current use of insulin  (HCC)   2. Acquired hypothyroidism   3. Hypogonadism male     Diabetes Mellitus type 2, complicated by diabetic nephropathy and neuropathy. - Diabetic status / severity: Uncontrolled.  Lab Results  Component Value Date   HGBA1C 8.1 (A) 08/20/2024    - Hemoglobin A1c goal <6.5%   Discussed about compliance with bolusing for all meals.  Advised to use carb count and use the bolus wizard to bolus for the meals.  Changed pump setting as follows.  Decrease basal rates for the morning to avoid hypoglycemia when on manual mode.  - Medications:   OmniPod 5 with Dexcom G7 using U-500 insulin   Insulin  Pump setting: Previous settings. Basal ( total 14.5) MN- 0.1 u/hour 6AM- 1.25, changed to 1.0 11AM- 0.8 65M- 0.4  Bolus CHO Ratio (1unit:CHO) MN- 1:15    Correction/Sensitivity: MN- 1:50  Target: 130   Active insulin  time: 6 hours  - Home glucose testing: continue CGM /Dexcom G7  and check blood glucose as needed.   - Discussed/ Gave Hypoglycemia treatment plan. # Consult :  not required at this time.   # CKD 4, ESRD status post renal transplant in 2018, following with nephrology.  Microalbuminuria present.   Last  Lab Results  Component Value Date   MICRALBCREAT 238 (H) 06/19/2024     # Foot check nightly / neuropathy, continue gabapentin , following with podiatry/neurology. - Diabetic foot exam as above.  Recommended for ABI.  Patient wants to think about it.  # Annual dilated diabetic eye exams.   - Diet: Make healthy diabetic food choices  2. Blood pressure  -  BP Readings from Last 1 Encounters:  08/20/24 136/78    - Control is in target.  - No change in current plans.  3. Lipid status / Hyperlipidemia - Last  Lab Results  Component Value Date   LDLCALC 52 01/08/2024   - Continue rosuvastatin  20 mg daily.  # Hypothyroidism : Continue levothyroxine  112 mcg daily.  Thyroid  function test was normal in July 2025. - Sent new prescription for levothyroxine  for early refill.  He misplaced his medication bottle.  # Hypogonadism : Continue testosterone  cypionate 120 mg weekly.  Testosterone  level is acceptable in July 2025.  Diagnoses and all orders for this visit:  Uncontrolled type 2 diabetes mellitus with hyperglycemia, with long-term current use of insulin  (HCC) -     POCT glycosylated hemoglobin (Hb A1C)  Acquired hypothyroidism -     levothyroxine  (SYNTHROID ) 112 MCG tablet; Take 1 tablet (112 mcg total) by mouth daily.  Hypogonadism male   DISPOSITION Follow up in clinic in 3 months suggested.     All questions answered and patient verbalized understanding of the plan.  Monay Houlton, MD [facility] Endocrinology [facility] [address] [place], [address] [place], [place] [address] Phone # [phone]  At least part of this note was generated using voice recognition software. Inadvertent word errors may have occurred, which were not recognized during the proofreading process.

## 2024-08-23 ENCOUNTER — Other Ambulatory Visit: Payer: Self-pay | Admitting: Endocrinology

## 2024-08-24 ENCOUNTER — Other Ambulatory Visit (HOSPITAL_COMMUNITY): Payer: Self-pay

## 2024-08-24 ENCOUNTER — Telehealth: Payer: Self-pay

## 2024-08-24 NOTE — Telephone Encounter (Signed)
 Pharmacy Patient Advocate Encounter   Received notification from RX Request Messages that prior authorization for Novolog  is required/requested.   Insurance verification completed.   The patient is insured through [employer].   Per test claim: Insurance will cover the generic Novolog  (insulin  aspart).   30 day supply = $35 90 day supply = $105

## 2024-09-01 ENCOUNTER — Other Ambulatory Visit: Payer: Self-pay | Admitting: Endocrinology

## 2024-09-01 DIAGNOSIS — E1165 Type 2 diabetes mellitus with hyperglycemia: Secondary | ICD-10-CM

## 2024-09-07 NOTE — Telephone Encounter (Signed)
 Refill request complete

## 2024-09-10 ENCOUNTER — Encounter: Payer: Self-pay | Admitting: Endocrinology

## 2024-09-10 ENCOUNTER — Other Ambulatory Visit: Payer: Self-pay

## 2024-09-10 DIAGNOSIS — E1165 Type 2 diabetes mellitus with hyperglycemia: Secondary | ICD-10-CM

## 2024-09-10 MED ORDER — DEXCOM G7 15 DAY SENSOR MISC: 1.0000 | 2 refills | Status: AC

## 2024-09-10 NOTE — Telephone Encounter (Signed)
 Regarding medication for mycophenolate , I am not ordering provider for this medication.  Probably coming from your nephrologist, please check with them.  Sent prescription for Dexcom G7 15 days check with your pharmacy if it is available.  Thanks.

## 2024-09-20 ENCOUNTER — Other Ambulatory Visit: Payer: Self-pay | Admitting: Neurology

## 2024-10-15 ENCOUNTER — Other Ambulatory Visit: Payer: Self-pay

## 2024-10-15 ENCOUNTER — Ambulatory Visit: Admitting: Podiatry

## 2024-10-15 ENCOUNTER — Encounter: Payer: Self-pay | Admitting: Podiatry

## 2024-10-15 DIAGNOSIS — M79675 Pain in left toe(s): Secondary | ICD-10-CM | POA: Diagnosis not present

## 2024-10-15 DIAGNOSIS — E1149 Type 2 diabetes mellitus with other diabetic neurological complication: Secondary | ICD-10-CM

## 2024-10-15 DIAGNOSIS — B351 Tinea unguium: Secondary | ICD-10-CM

## 2024-10-15 DIAGNOSIS — N189 Chronic kidney disease, unspecified: Secondary | ICD-10-CM

## 2024-10-15 DIAGNOSIS — I4891 Unspecified atrial fibrillation: Secondary | ICD-10-CM

## 2024-10-15 DIAGNOSIS — M79674 Pain in right toe(s): Secondary | ICD-10-CM

## 2024-10-15 MED ORDER — APIXABAN 5 MG PO TABS: 5.0000 mg | ORAL_TABLET | Freq: Two times a day (BID) | ORAL | 1 refills | Status: AC

## 2024-10-15 NOTE — Progress Notes (Signed)
 This patient returns to my office for at risk foot care.  This patient requires this care by a professional since this patient will be at risk due to having uncontrolled diabetes and kidney transplant.  This patient is unable to cut nails himself since the patient cannot reach his nails.These nails are painful walking and wearing shoes.  Patient says he lost his second toenail right foot on Friday.  The toe is presently healing uneventfully.   This patient presents for at risk foot care today.  General Appearance  Alert, conversant and in no acute stress.  Vascular  Dorsalis pedis and posterior tibial  pulses are palpable  bilaterally.  Capillary return is within normal limits  bilaterally. Temperature is within normal limits  bilaterally.  Neurologic  Senn-Weinstein monofilament wire test diminished   bilaterally. Muscle power within normal limits bilaterally.  Nails Thick disfigured discolored nails with subungual debris  from hallux to fifth toes bilaterally. No evidence of bacterial infection or drainage bilaterally.  Orthopedic  No limitations of motion  feet .  No crepitus or effusions noted.  No bony pathology or digital deformities noted. HAV  B/L.  Skin  normotropic skin with no porokeratosis noted bilaterally.  No signs of infections or ulcers noted.     Onychomycosis  Pain in right toes  Pain in left toes  Ulcer second toe right foot.  Consent was obtained for treatment procedures.   Mechanical debridement of nails 1-5  bilaterally performed with a nail nipper.  Filed with dremel without incident.    Return office visit  10 weeks                Told patient to return for periodic foot care and evaluation due to potential at risk complications.   Cordella Bold DPM

## 2024-10-25 ENCOUNTER — Encounter: Payer: Self-pay | Admitting: Endocrinology

## 2024-10-26 ENCOUNTER — Other Ambulatory Visit: Payer: Self-pay

## 2024-10-26 DIAGNOSIS — E1165 Type 2 diabetes mellitus with hyperglycemia: Secondary | ICD-10-CM

## 2024-10-26 MED ORDER — DEXCOM G7 15 DAY SENSOR MISC: 1.0000 | 3 refills | Status: AC

## 2024-10-27 ENCOUNTER — Other Ambulatory Visit: Payer: Self-pay | Admitting: Endocrinology

## 2024-10-27 DIAGNOSIS — E782 Mixed hyperlipidemia: Secondary | ICD-10-CM

## 2024-10-28 ENCOUNTER — Other Ambulatory Visit (HOSPITAL_COMMUNITY): Payer: Self-pay

## 2024-11-12 ENCOUNTER — Encounter: Payer: Self-pay | Admitting: Endocrinology

## 2024-11-25 ENCOUNTER — Ambulatory Visit: Admitting: Endocrinology

## 2024-12-16 ENCOUNTER — Ambulatory Visit: Admitting: Neurology

## 2024-12-24 ENCOUNTER — Ambulatory Visit: Admitting: Podiatry
